# Patient Record
Sex: Male | Born: 1940
Health system: Southern US, Community
[De-identification: ages and names within clinical notes are randomized; demographics above are authoritative.]

## PROBLEM LIST (undated history)

## (undated) ENCOUNTER — Emergency Department (HOSPITAL_BASED_OUTPATIENT_CLINIC_OR_DEPARTMENT_OTHER): Admission: EM | Payer: No Typology Code available for payment source | Source: Home / Self Care

## (undated) DIAGNOSIS — S83209A Unspecified tear of unspecified meniscus, current injury, unspecified knee, initial encounter: Secondary | ICD-10-CM

## (undated) DIAGNOSIS — R600 Localized edema: Secondary | ICD-10-CM

## (undated) DIAGNOSIS — Z8546 Personal history of malignant neoplasm of prostate: Secondary | ICD-10-CM

## (undated) DIAGNOSIS — K644 Residual hemorrhoidal skin tags: Secondary | ICD-10-CM

## (undated) DIAGNOSIS — H903 Sensorineural hearing loss, bilateral: Secondary | ICD-10-CM

## (undated) DIAGNOSIS — K5730 Diverticulosis of large intestine without perforation or abscess without bleeding: Secondary | ICD-10-CM

## (undated) DIAGNOSIS — C61 Malignant neoplasm of prostate: Secondary | ICD-10-CM

## (undated) DIAGNOSIS — I2510 Atherosclerotic heart disease of native coronary artery without angina pectoris: Secondary | ICD-10-CM

## (undated) DIAGNOSIS — K449 Diaphragmatic hernia without obstruction or gangrene: Secondary | ICD-10-CM

## (undated) DIAGNOSIS — E785 Hyperlipidemia, unspecified: Secondary | ICD-10-CM

## (undated) DIAGNOSIS — M1990 Unspecified osteoarthritis, unspecified site: Secondary | ICD-10-CM

## (undated) DIAGNOSIS — Z86010 Personal history of colonic polyps: Secondary | ICD-10-CM

## (undated) DIAGNOSIS — H04123 Dry eye syndrome of bilateral lacrimal glands: Secondary | ICD-10-CM

## (undated) DIAGNOSIS — C189 Malignant neoplasm of colon, unspecified: Secondary | ICD-10-CM

## (undated) DIAGNOSIS — F329 Major depressive disorder, single episode, unspecified: Secondary | ICD-10-CM

## (undated) DIAGNOSIS — B54 Unspecified malaria: Secondary | ICD-10-CM

## (undated) DIAGNOSIS — E538 Deficiency of other specified B group vitamins: Secondary | ICD-10-CM

## (undated) DIAGNOSIS — R350 Frequency of micturition: Secondary | ICD-10-CM

## (undated) DIAGNOSIS — M7730 Calcaneal spur, unspecified foot: Secondary | ICD-10-CM

## (undated) DIAGNOSIS — R931 Abnormal findings on diagnostic imaging of heart and coronary circulation: Secondary | ICD-10-CM

## (undated) DIAGNOSIS — I10 Essential (primary) hypertension: Secondary | ICD-10-CM

## (undated) DIAGNOSIS — M4306 Spondylolysis, lumbar region: Secondary | ICD-10-CM

## (undated) DIAGNOSIS — R252 Cramp and spasm: Secondary | ICD-10-CM

## (undated) DIAGNOSIS — Z950 Presence of cardiac pacemaker: Secondary | ICD-10-CM

## (undated) DIAGNOSIS — K439 Ventral hernia without obstruction or gangrene: Secondary | ICD-10-CM

## (undated) DIAGNOSIS — M109 Gout, unspecified: Secondary | ICD-10-CM

## (undated) DIAGNOSIS — Z85038 Personal history of other malignant neoplasm of large intestine: Secondary | ICD-10-CM

## (undated) DIAGNOSIS — IMO0001 Reserved for inherently not codable concepts without codable children: Secondary | ICD-10-CM

## (undated) DIAGNOSIS — G4733 Obstructive sleep apnea (adult) (pediatric): Secondary | ICD-10-CM

## (undated) DIAGNOSIS — R972 Elevated prostate specific antigen [PSA]: Secondary | ICD-10-CM

## (undated) DIAGNOSIS — J189 Pneumonia, unspecified organism: Secondary | ICD-10-CM

## (undated) DIAGNOSIS — F32A Depression, unspecified: Secondary | ICD-10-CM

## (undated) DIAGNOSIS — K219 Gastro-esophageal reflux disease without esophagitis: Secondary | ICD-10-CM

## (undated) DIAGNOSIS — K911 Postgastric surgery syndromes: Secondary | ICD-10-CM

## (undated) DIAGNOSIS — E559 Vitamin D deficiency, unspecified: Secondary | ICD-10-CM

## (undated) HISTORY — DX: Essential (primary) hypertension: I10

## (undated) HISTORY — PX: CATARACT EXTRACTION W/ INTRAOCULAR LENS  IMPLANT, BILATERAL: SHX1307

## (undated) HISTORY — DX: Personal history of other malignant neoplasm of large intestine: Z85.038

## (undated) HISTORY — DX: Spondylolysis, lumbar region: M43.06

## (undated) HISTORY — PX: KNEE SURGERY: SHX244

## (undated) HISTORY — DX: Dry eye syndrome of bilateral lacrimal glands: H04.123

## (undated) HISTORY — PX: COLONOSCOPY: SHX174

## (undated) HISTORY — DX: Diaphragmatic hernia without obstruction or gangrene: K44.9

## (undated) HISTORY — DX: Personal history of malignant neoplasm of prostate: Z85.46

## (undated) HISTORY — DX: Hyperlipidemia, unspecified: E78.5

## (undated) HISTORY — DX: Elevated prostate specific antigen (PSA): R97.20

## (undated) HISTORY — DX: Sensorineural hearing loss, bilateral: H90.3

## (undated) HISTORY — DX: Obstructive sleep apnea (adult) (pediatric): G47.33

## (undated) HISTORY — DX: Calcaneal spur, unspecified foot: M77.30

## (undated) HISTORY — PX: PROSTATE BIOPSY: SHX241

## (undated) HISTORY — DX: Gastro-esophageal reflux disease without esophagitis: K21.9

## (undated) HISTORY — DX: Malignant neoplasm of prostate: C61

## (undated) HISTORY — DX: Gout, unspecified: M10.9

## (undated) HISTORY — DX: Diverticulosis of large intestine without perforation or abscess without bleeding: K57.30

## (undated) HISTORY — PX: ESOPHAGOGASTRODUODENOSCOPY: SHX1529

## (undated) HISTORY — PX: CARDIAC CATHETERIZATION: SHX172

## (undated) HISTORY — DX: Ventral hernia without obstruction or gangrene: K43.9

## (undated) HISTORY — DX: Residual hemorrhoidal skin tags: K64.4

## (undated) HISTORY — DX: Personal history of colonic polyps: Z86.010

## (undated) HISTORY — DX: Vitamin D deficiency, unspecified: E55.9

## (undated) HISTORY — DX: Deficiency of other specified B group vitamins: E53.8

## (undated) HISTORY — DX: Postgastric surgery syndromes: K91.1

## (undated) HISTORY — DX: Malignant neoplasm of colon, unspecified: C18.9

---
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
## 1962-07-05 DIAGNOSIS — B54 Unspecified malaria: Secondary | ICD-10-CM

## 1962-07-05 HISTORY — DX: Unspecified malaria: B54

## 1981-07-05 DIAGNOSIS — C189 Malignant neoplasm of colon, unspecified: Secondary | ICD-10-CM

## 1981-07-05 HISTORY — DX: Malignant neoplasm of colon, unspecified: C18.9

## 1984-07-05 HISTORY — PX: VERTICAL BANDED GASTROPLASTY: SHX1102

## 1986-07-05 DIAGNOSIS — Z85038 Personal history of other malignant neoplasm of large intestine: Secondary | ICD-10-CM

## 1986-07-05 HISTORY — DX: Personal history of other malignant neoplasm of large intestine: Z85.038

## 1987-07-06 HISTORY — PX: OTHER SURGICAL HISTORY: SHX169

## 2004-07-05 HISTORY — PX: LAPAROSCOPIC INCISIONAL / UMBILICAL / VENTRAL HERNIA REPAIR: SUR789

## 2005-03-24 ENCOUNTER — Ambulatory Visit: Payer: Self-pay | Admitting: Internal Medicine

## 2005-03-25 ENCOUNTER — Ambulatory Visit: Payer: Self-pay | Admitting: Internal Medicine

## 2005-04-07 ENCOUNTER — Ambulatory Visit: Payer: Self-pay | Admitting: Pulmonary Disease

## 2005-05-21 ENCOUNTER — Ambulatory Visit: Payer: Self-pay | Admitting: Internal Medicine

## 2005-06-08 ENCOUNTER — Ambulatory Visit: Payer: Self-pay | Admitting: Gastroenterology

## 2005-06-08 ENCOUNTER — Ambulatory Visit: Payer: Self-pay | Admitting: Internal Medicine

## 2005-06-11 ENCOUNTER — Ambulatory Visit: Payer: Self-pay | Admitting: Internal Medicine

## 2005-06-25 ENCOUNTER — Ambulatory Visit: Payer: Self-pay | Admitting: Gastroenterology

## 2005-07-05 HISTORY — PX: KNEE ARTHROSCOPY: SUR90

## 2005-07-07 ENCOUNTER — Ambulatory Visit: Payer: Self-pay | Admitting: Gastroenterology

## 2005-07-07 ENCOUNTER — Encounter (INDEPENDENT_AMBULATORY_CARE_PROVIDER_SITE_OTHER): Payer: Self-pay | Admitting: Specialist

## 2005-07-07 DIAGNOSIS — K5730 Diverticulosis of large intestine without perforation or abscess without bleeding: Secondary | ICD-10-CM

## 2005-07-07 HISTORY — DX: Diverticulosis of large intestine without perforation or abscess without bleeding: K57.30

## 2005-07-08 ENCOUNTER — Ambulatory Visit: Payer: Self-pay | Admitting: Endocrinology

## 2005-07-19 ENCOUNTER — Ambulatory Visit: Payer: Self-pay | Admitting: Internal Medicine

## 2005-07-21 ENCOUNTER — Ambulatory Visit: Payer: Self-pay | Admitting: Internal Medicine

## 2005-07-26 ENCOUNTER — Ambulatory Visit: Payer: Self-pay | Admitting: Internal Medicine

## 2005-08-01 ENCOUNTER — Encounter: Admission: RE | Admit: 2005-08-01 | Discharge: 2005-08-01 | Payer: Self-pay | Admitting: Internal Medicine

## 2005-08-03 ENCOUNTER — Ambulatory Visit: Payer: Self-pay | Admitting: Internal Medicine

## 2005-09-14 ENCOUNTER — Ambulatory Visit (HOSPITAL_COMMUNITY): Admission: RE | Admit: 2005-09-14 | Discharge: 2005-09-14 | Payer: Self-pay | Admitting: Ophthalmology

## 2005-09-17 ENCOUNTER — Ambulatory Visit: Payer: Self-pay | Admitting: Internal Medicine

## 2005-10-07 ENCOUNTER — Ambulatory Visit: Payer: Self-pay | Admitting: Internal Medicine

## 2005-10-13 ENCOUNTER — Ambulatory Visit: Payer: Self-pay | Admitting: *Deleted

## 2005-11-22 ENCOUNTER — Ambulatory Visit: Payer: Self-pay | Admitting: Internal Medicine

## 2006-01-17 ENCOUNTER — Ambulatory Visit: Payer: Self-pay | Admitting: Internal Medicine

## 2006-07-06 ENCOUNTER — Ambulatory Visit: Payer: Self-pay | Admitting: Internal Medicine

## 2006-07-26 ENCOUNTER — Ambulatory Visit: Payer: Self-pay | Admitting: Internal Medicine

## 2006-08-16 ENCOUNTER — Ambulatory Visit: Payer: Self-pay | Admitting: Internal Medicine

## 2006-08-16 LAB — CONVERTED CEMR LAB
BUN: 16 mg/dL (ref 6–23)
CO2: 30 meq/L (ref 19–32)
Calcium: 9.3 mg/dL (ref 8.4–10.5)
Chloride: 105 meq/L (ref 96–112)
Creatinine, Ser: 1.2 mg/dL (ref 0.4–1.5)
GFR calc Af Amer: 78 mL/min
GFR calc non Af Amer: 64 mL/min
Glucose, Bld: 97 mg/dL (ref 70–99)
Potassium: 4.2 meq/L (ref 3.5–5.1)
Sodium: 142 meq/L (ref 135–145)

## 2006-08-30 ENCOUNTER — Ambulatory Visit: Payer: Self-pay | Admitting: Internal Medicine

## 2006-09-01 ENCOUNTER — Encounter: Admission: RE | Admit: 2006-09-01 | Discharge: 2006-09-01 | Payer: Self-pay | Admitting: Internal Medicine

## 2006-09-03 HISTORY — PX: LAMINECTOMY AND MICRODISCECTOMY LUMBAR SPINE: SHX1913

## 2006-09-05 ENCOUNTER — Ambulatory Visit: Payer: Self-pay | Admitting: Internal Medicine

## 2006-09-19 ENCOUNTER — Inpatient Hospital Stay (HOSPITAL_COMMUNITY): Admission: RE | Admit: 2006-09-19 | Discharge: 2006-09-20 | Payer: Self-pay | Admitting: Neurosurgery

## 2006-10-02 ENCOUNTER — Encounter: Admission: RE | Admit: 2006-10-02 | Discharge: 2006-10-02 | Payer: Self-pay | Admitting: Neurosurgery

## 2006-10-16 ENCOUNTER — Emergency Department (HOSPITAL_COMMUNITY): Admission: EM | Admit: 2006-10-16 | Discharge: 2006-10-17 | Payer: Self-pay | Admitting: Emergency Medicine

## 2006-10-19 ENCOUNTER — Ambulatory Visit: Payer: Self-pay | Admitting: Internal Medicine

## 2006-10-25 ENCOUNTER — Ambulatory Visit: Payer: Self-pay | Admitting: Gastroenterology

## 2006-10-26 ENCOUNTER — Ambulatory Visit: Payer: Self-pay | Admitting: Gastroenterology

## 2006-10-26 DIAGNOSIS — K449 Diaphragmatic hernia without obstruction or gangrene: Secondary | ICD-10-CM | POA: Insufficient documentation

## 2006-11-10 ENCOUNTER — Encounter: Admission: RE | Admit: 2006-11-10 | Discharge: 2006-11-10 | Payer: Self-pay | Admitting: Surgery

## 2006-11-11 ENCOUNTER — Encounter: Admission: RE | Admit: 2006-11-11 | Discharge: 2006-11-11 | Payer: Self-pay | Admitting: Surgery

## 2006-11-24 ENCOUNTER — Encounter
Admission: RE | Admit: 2006-11-24 | Discharge: 2006-11-24 | Payer: Self-pay | Admitting: Physical Medicine and Rehabilitation

## 2007-01-18 DIAGNOSIS — M109 Gout, unspecified: Secondary | ICD-10-CM

## 2007-01-18 DIAGNOSIS — G4733 Obstructive sleep apnea (adult) (pediatric): Secondary | ICD-10-CM

## 2007-01-18 DIAGNOSIS — Z85038 Personal history of other malignant neoplasm of large intestine: Secondary | ICD-10-CM | POA: Insufficient documentation

## 2007-01-18 DIAGNOSIS — K219 Gastro-esophageal reflux disease without esophagitis: Secondary | ICD-10-CM | POA: Insufficient documentation

## 2007-01-18 DIAGNOSIS — Z860100 Personal history of colon polyps, unspecified: Secondary | ICD-10-CM | POA: Insufficient documentation

## 2007-01-18 DIAGNOSIS — I10 Essential (primary) hypertension: Secondary | ICD-10-CM | POA: Insufficient documentation

## 2007-01-18 HISTORY — DX: Essential (primary) hypertension: I10

## 2007-01-18 HISTORY — DX: Gout, unspecified: M10.9

## 2007-01-18 HISTORY — DX: Personal history of colonic polyps: Z86.010

## 2007-01-18 HISTORY — DX: Obstructive sleep apnea (adult) (pediatric): G47.33

## 2007-01-18 HISTORY — DX: Gastro-esophageal reflux disease without esophagitis: K21.9

## 2007-01-18 HISTORY — DX: Personal history of colon polyps, unspecified: Z86.0100

## 2007-01-31 ENCOUNTER — Ambulatory Visit: Payer: Self-pay | Admitting: Gastroenterology

## 2007-02-06 ENCOUNTER — Ambulatory Visit (HOSPITAL_COMMUNITY): Admission: RE | Admit: 2007-02-06 | Discharge: 2007-02-06 | Payer: Self-pay | Admitting: Gastroenterology

## 2007-02-17 ENCOUNTER — Ambulatory Visit: Payer: Self-pay | Admitting: Internal Medicine

## 2007-02-20 ENCOUNTER — Ambulatory Visit: Payer: Self-pay | Admitting: Internal Medicine

## 2007-02-20 LAB — CONVERTED CEMR LAB
ALT: 18 units/L (ref 0–53)
AST: 20 units/L (ref 0–37)
BUN: 28 mg/dL — ABNORMAL HIGH (ref 6–23)
Basophils Absolute: 0 10*3/uL (ref 0.0–0.1)
Basophils Relative: 0.6 % (ref 0.0–1.0)
CO2: 28 meq/L (ref 19–32)
Calcium: 9.2 mg/dL (ref 8.4–10.5)
Chloride: 110 meq/L (ref 96–112)
Cholesterol: 178 mg/dL (ref 0–200)
Creatinine, Ser: 1.2 mg/dL (ref 0.4–1.5)
Eosinophils Absolute: 0.2 10*3/uL (ref 0.0–0.6)
Eosinophils Relative: 3.2 % (ref 0.0–5.0)
GFR calc Af Amer: 78 mL/min
GFR calc non Af Amer: 64 mL/min
Glucose, Bld: 113 mg/dL — ABNORMAL HIGH (ref 70–99)
HCT: 38.4 % — ABNORMAL LOW (ref 39.0–52.0)
HDL: 34.3 mg/dL — ABNORMAL LOW (ref >39.0)
Hemoglobin: 13.4 g/dL (ref 13.0–17.0)
Hgb A1c MFr Bld: 5.6 % (ref 4.6–6.0)
LDL Cholesterol: 118 mg/dL — ABNORMAL HIGH (ref 0–99)
Lymphocytes Relative: 34.8 % (ref 12.0–46.0)
MCHC: 34.9 g/dL (ref 30.0–36.0)
MCV: 87 fL (ref 78.0–100.0)
Monocytes Absolute: 0.5 10*3/uL (ref 0.2–0.7)
Monocytes Relative: 9.4 % (ref 3.0–11.0)
Neutro Abs: 2.6 10*3/uL (ref 1.4–7.7)
Neutrophils Relative %: 52 % (ref 43.0–77.0)
PSA: 3.42 ng/mL (ref 0.10–4.00)
Platelets: 220 10*3/uL (ref 150–400)
Potassium: 4.2 meq/L (ref 3.5–5.1)
RBC: 4.42 M/uL (ref 4.22–5.81)
RDW: 13.5 % (ref 11.5–14.6)
Sodium: 145 meq/L (ref 135–145)
TSH: 0.38 microintl units/mL (ref 0.35–5.50)
Total CHOL/HDL Ratio: 5.2
Triglycerides: 130 mg/dL (ref 0–149)
VLDL: 26 mg/dL (ref 0–40)
WBC: 5 10*3/uL (ref 4.5–10.5)

## 2007-02-27 ENCOUNTER — Ambulatory Visit: Payer: Self-pay | Admitting: Gastroenterology

## 2007-02-27 ENCOUNTER — Encounter: Payer: Self-pay | Admitting: Gastroenterology

## 2007-03-01 ENCOUNTER — Ambulatory Visit (HOSPITAL_COMMUNITY): Admission: RE | Admit: 2007-03-01 | Discharge: 2007-03-01 | Payer: Self-pay | Admitting: Gastroenterology

## 2007-04-03 ENCOUNTER — Ambulatory Visit (HOSPITAL_COMMUNITY): Admission: RE | Admit: 2007-04-03 | Discharge: 2007-04-03 | Payer: Self-pay | Admitting: Gastroenterology

## 2007-04-18 ENCOUNTER — Encounter: Admission: RE | Admit: 2007-04-18 | Discharge: 2007-04-18 | Payer: Self-pay | Admitting: Specialist

## 2007-05-09 ENCOUNTER — Encounter: Payer: Self-pay | Admitting: Internal Medicine

## 2007-05-23 ENCOUNTER — Ambulatory Visit (HOSPITAL_BASED_OUTPATIENT_CLINIC_OR_DEPARTMENT_OTHER): Admission: RE | Admit: 2007-05-23 | Discharge: 2007-05-23 | Payer: Self-pay | Admitting: Specialist

## 2007-05-23 HISTORY — PX: SHOULDER ARTHROSCOPY: SHX128

## 2007-06-05 ENCOUNTER — Ambulatory Visit: Payer: Self-pay | Admitting: Internal Medicine

## 2007-06-13 ENCOUNTER — Encounter: Payer: Self-pay | Admitting: Internal Medicine

## 2007-06-22 ENCOUNTER — Ambulatory Visit: Payer: Self-pay | Admitting: Gastroenterology

## 2007-08-17 ENCOUNTER — Telehealth: Payer: Self-pay | Admitting: Internal Medicine

## 2007-08-21 ENCOUNTER — Ambulatory Visit: Payer: Self-pay | Admitting: Internal Medicine

## 2007-08-21 DIAGNOSIS — G4700 Insomnia, unspecified: Secondary | ICD-10-CM

## 2007-08-21 HISTORY — DX: Insomnia, unspecified: G47.00

## 2007-10-17 DIAGNOSIS — E669 Obesity, unspecified: Secondary | ICD-10-CM | POA: Insufficient documentation

## 2007-10-23 ENCOUNTER — Ambulatory Visit: Payer: Self-pay | Admitting: Internal Medicine

## 2007-10-23 LAB — CONVERTED CEMR LAB
ALT: 16 units/L (ref 0–53)
AST: 16 units/L (ref 0–37)
BUN: 16 mg/dL (ref 6–23)
CO2: 30 meq/L (ref 19–32)
Calcium: 9.1 mg/dL (ref 8.4–10.5)
Chloride: 111 meq/L (ref 96–112)
Cholesterol: 195 mg/dL (ref 0–200)
Creatinine, Ser: 1.2 mg/dL (ref 0.4–1.5)
GFR calc Af Amer: 78 mL/min
GFR calc non Af Amer: 64 mL/min
Glucose, Bld: 110 mg/dL — ABNORMAL HIGH (ref 70–99)
HDL: 34.1 mg/dL — ABNORMAL LOW (ref >39.0)
Hgb A1c MFr Bld: 5.9 % (ref 4.6–6.0)
LDL Cholesterol: 142 mg/dL — ABNORMAL HIGH (ref 0–99)
Potassium: 4.1 meq/L (ref 3.5–5.1)
Sodium: 144 meq/L (ref 135–145)
TSH: 0.55 microintl units/mL (ref 0.35–5.50)
Total CHOL/HDL Ratio: 5.7
Triglycerides: 96 mg/dL (ref 0–149)
Uric Acid, Serum: 7.2 mg/dL (ref 4.0–7.8)
VLDL: 19 mg/dL (ref 0–40)

## 2007-11-13 ENCOUNTER — Ambulatory Visit: Payer: Self-pay | Admitting: Internal Medicine

## 2008-01-29 ENCOUNTER — Encounter: Payer: Self-pay | Admitting: Internal Medicine

## 2008-02-12 ENCOUNTER — Ambulatory Visit: Payer: Self-pay | Admitting: Internal Medicine

## 2008-02-22 ENCOUNTER — Ambulatory Visit: Payer: Self-pay | Admitting: Internal Medicine

## 2008-02-22 LAB — CONVERTED CEMR LAB
BUN: 20 mg/dL (ref 6–23)
CO2: 28 meq/L (ref 19–32)
Calcium: 8.8 mg/dL (ref 8.4–10.5)
Chloride: 110 meq/L (ref 96–112)
Cholesterol: 138 mg/dL (ref 0–200)
Creatinine, Ser: 1.1 mg/dL (ref 0.4–1.5)
GFR calc Af Amer: 86 mL/min
GFR calc non Af Amer: 71 mL/min
Glucose, Bld: 97 mg/dL (ref 70–99)
HDL: 26.9 mg/dL — ABNORMAL LOW (ref >39.0)
LDL Cholesterol: 93 mg/dL (ref 0–99)
Potassium: 4.1 meq/L (ref 3.5–5.1)
Sodium: 143 meq/L (ref 135–145)
TSH: 0.57 microintl units/mL (ref 0.35–5.50)
Total CHOL/HDL Ratio: 5.1
Triglycerides: 89 mg/dL (ref 0–149)
Uric Acid, Serum: 5.8 mg/dL (ref 4.0–7.8)
VLDL: 18 mg/dL (ref 0–40)

## 2008-03-14 ENCOUNTER — Telehealth: Payer: Self-pay | Admitting: Internal Medicine

## 2008-04-30 ENCOUNTER — Ambulatory Visit: Payer: Self-pay | Admitting: Internal Medicine

## 2008-05-02 ENCOUNTER — Encounter: Payer: Self-pay | Admitting: Internal Medicine

## 2008-07-23 ENCOUNTER — Ambulatory Visit: Payer: Self-pay | Admitting: Internal Medicine

## 2008-07-23 LAB — CONVERTED CEMR LAB
BUN: 18 mg/dL (ref 6–23)
CO2: 30 meq/L (ref 19–32)
Calcium: 9.1 mg/dL (ref 8.4–10.5)
Chloride: 107 meq/L (ref 96–112)
Creatinine, Ser: 1.2 mg/dL (ref 0.4–1.5)
GFR calc Af Amer: 77 mL/min
GFR calc non Af Amer: 64 mL/min
Glucose, Bld: 110 mg/dL — ABNORMAL HIGH (ref 70–99)
Hgb A1c MFr Bld: 5.8 % (ref 4.6–6.0)
Potassium: 4.5 meq/L (ref 3.5–5.1)
Sodium: 144 meq/L (ref 135–145)

## 2008-07-26 ENCOUNTER — Ambulatory Visit: Payer: Self-pay | Admitting: Internal Medicine

## 2008-07-26 ENCOUNTER — Ambulatory Visit: Payer: Self-pay | Admitting: Gastroenterology

## 2008-07-26 DIAGNOSIS — E785 Hyperlipidemia, unspecified: Secondary | ICD-10-CM | POA: Insufficient documentation

## 2008-08-19 ENCOUNTER — Encounter: Payer: Self-pay | Admitting: Gastroenterology

## 2008-08-19 ENCOUNTER — Ambulatory Visit: Payer: Self-pay | Admitting: Gastroenterology

## 2008-08-21 ENCOUNTER — Encounter: Payer: Self-pay | Admitting: Gastroenterology

## 2008-08-28 ENCOUNTER — Telehealth (INDEPENDENT_AMBULATORY_CARE_PROVIDER_SITE_OTHER): Payer: Self-pay | Admitting: *Deleted

## 2008-09-16 ENCOUNTER — Ambulatory Visit: Payer: Self-pay | Admitting: Internal Medicine

## 2008-09-16 DIAGNOSIS — M7660 Achilles tendinitis, unspecified leg: Secondary | ICD-10-CM | POA: Insufficient documentation

## 2008-09-23 ENCOUNTER — Encounter: Payer: Self-pay | Admitting: Internal Medicine

## 2008-10-08 ENCOUNTER — Ambulatory Visit: Payer: Self-pay | Admitting: Internal Medicine

## 2008-10-08 DIAGNOSIS — J069 Acute upper respiratory infection, unspecified: Secondary | ICD-10-CM | POA: Insufficient documentation

## 2008-10-16 ENCOUNTER — Ambulatory Visit (HOSPITAL_BASED_OUTPATIENT_CLINIC_OR_DEPARTMENT_OTHER): Admission: RE | Admit: 2008-10-16 | Discharge: 2008-10-16 | Payer: Self-pay | Admitting: Internal Medicine

## 2008-10-16 ENCOUNTER — Ambulatory Visit: Payer: Self-pay | Admitting: Diagnostic Radiology

## 2008-10-16 ENCOUNTER — Ambulatory Visit: Payer: Self-pay | Admitting: Internal Medicine

## 2008-10-16 DIAGNOSIS — R05 Cough: Secondary | ICD-10-CM | POA: Insufficient documentation

## 2008-10-16 DIAGNOSIS — R059 Cough, unspecified: Secondary | ICD-10-CM | POA: Insufficient documentation

## 2008-10-24 ENCOUNTER — Ambulatory Visit: Payer: Self-pay | Admitting: Internal Medicine

## 2008-11-26 ENCOUNTER — Ambulatory Visit: Payer: Self-pay | Admitting: Internal Medicine

## 2008-11-26 LAB — CONVERTED CEMR LAB
BUN: 17 mg/dL (ref 6–23)
CO2: 27 meq/L (ref 19–32)
Calcium: 8.9 mg/dL (ref 8.4–10.5)
Chloride: 112 meq/L (ref 96–112)
Creatinine, Ser: 1.2 mg/dL (ref 0.4–1.5)
GFR calc non Af Amer: 63.82 mL/min (ref >60)
Glucose, Bld: 100 mg/dL — ABNORMAL HIGH (ref 70–99)
Potassium: 4.3 meq/L (ref 3.5–5.1)
Sodium: 145 meq/L (ref 135–145)

## 2008-11-29 ENCOUNTER — Telehealth: Payer: Self-pay | Admitting: Internal Medicine

## 2009-02-04 ENCOUNTER — Ambulatory Visit: Payer: Self-pay | Admitting: Internal Medicine

## 2009-02-04 DIAGNOSIS — K439 Ventral hernia without obstruction or gangrene: Secondary | ICD-10-CM | POA: Insufficient documentation

## 2009-03-31 ENCOUNTER — Ambulatory Visit: Payer: Self-pay | Admitting: Internal Medicine

## 2009-03-31 DIAGNOSIS — M7730 Calcaneal spur, unspecified foot: Secondary | ICD-10-CM | POA: Insufficient documentation

## 2009-04-01 ENCOUNTER — Ambulatory Visit: Payer: Self-pay | Admitting: Internal Medicine

## 2009-04-01 LAB — CONVERTED CEMR LAB
ALT: 15 units/L (ref 0–53)
AST: 13 units/L (ref 0–37)
Albumin: 3.9 g/dL (ref 3.5–5.2)
Alkaline Phosphatase: 62 units/L (ref 39–117)
BUN: 18 mg/dL (ref 6–23)
Bilirubin, Direct: 0.1 mg/dL (ref 0.0–0.3)
CO2: 25 meq/L (ref 19–32)
Calcium: 8.8 mg/dL (ref 8.4–10.5)
Chloride: 106 meq/L (ref 96–112)
Cholesterol: 128 mg/dL (ref 0–200)
Creatinine, Ser: 1.1 mg/dL (ref 0.40–1.50)
Glucose, Bld: 95 mg/dL (ref 70–99)
HDL: 44 mg/dL (ref >39)
Hgb A1c MFr Bld: 5.5 % (ref 4.6–6.1)
Indirect Bilirubin: 0.3 mg/dL (ref 0.0–0.9)
LDL Cholesterol: 57 mg/dL (ref 0–99)
Potassium: 4.1 meq/L (ref 3.5–5.3)
Sodium: 142 meq/L (ref 135–145)
TSH: 0.762 microintl units/mL (ref 0.350–4.500)
Total Bilirubin: 0.4 mg/dL (ref 0.3–1.2)
Total CHOL/HDL Ratio: 2.9
Total Protein: 6.5 g/dL (ref 6.0–8.3)
Triglycerides: 135 mg/dL (ref <150)
VLDL: 27 mg/dL (ref 0–40)

## 2009-04-11 ENCOUNTER — Encounter: Admission: RE | Admit: 2009-04-11 | Discharge: 2009-07-02 | Payer: Self-pay | Admitting: Orthopedic Surgery

## 2009-06-06 ENCOUNTER — Ambulatory Visit: Payer: Self-pay | Admitting: Internal Medicine

## 2009-06-30 ENCOUNTER — Telehealth: Payer: Self-pay | Admitting: Internal Medicine

## 2009-07-01 ENCOUNTER — Ambulatory Visit: Payer: Self-pay | Admitting: Radiology

## 2009-07-01 ENCOUNTER — Telehealth: Payer: Self-pay | Admitting: Internal Medicine

## 2009-07-01 ENCOUNTER — Observation Stay (HOSPITAL_COMMUNITY): Admission: AD | Admit: 2009-07-01 | Discharge: 2009-07-02 | Payer: Self-pay | Admitting: Internal Medicine

## 2009-07-01 ENCOUNTER — Ambulatory Visit: Payer: Self-pay | Admitting: Cardiovascular Disease

## 2009-07-01 ENCOUNTER — Encounter: Payer: Self-pay | Admitting: Emergency Medicine

## 2009-07-02 ENCOUNTER — Encounter (INDEPENDENT_AMBULATORY_CARE_PROVIDER_SITE_OTHER): Payer: Self-pay | Admitting: Internal Medicine

## 2009-07-02 ENCOUNTER — Telehealth: Payer: Self-pay | Admitting: Internal Medicine

## 2009-07-02 DIAGNOSIS — R079 Chest pain, unspecified: Secondary | ICD-10-CM | POA: Insufficient documentation

## 2009-07-03 ENCOUNTER — Telehealth (INDEPENDENT_AMBULATORY_CARE_PROVIDER_SITE_OTHER): Payer: Self-pay | Admitting: *Deleted

## 2009-07-07 ENCOUNTER — Ambulatory Visit: Payer: Self-pay

## 2009-07-07 ENCOUNTER — Encounter (HOSPITAL_COMMUNITY): Admission: RE | Admit: 2009-07-07 | Discharge: 2009-09-08 | Payer: Self-pay | Admitting: Internal Medicine

## 2009-07-07 ENCOUNTER — Ambulatory Visit: Payer: Self-pay | Admitting: Cardiology

## 2009-07-08 ENCOUNTER — Telehealth (INDEPENDENT_AMBULATORY_CARE_PROVIDER_SITE_OTHER): Payer: Self-pay | Admitting: *Deleted

## 2009-07-11 ENCOUNTER — Encounter (INDEPENDENT_AMBULATORY_CARE_PROVIDER_SITE_OTHER): Payer: Self-pay | Admitting: *Deleted

## 2009-07-30 ENCOUNTER — Encounter: Payer: Self-pay | Admitting: Internal Medicine

## 2009-08-01 ENCOUNTER — Encounter (INDEPENDENT_AMBULATORY_CARE_PROVIDER_SITE_OTHER): Payer: Self-pay | Admitting: *Deleted

## 2009-08-01 ENCOUNTER — Telehealth: Payer: Self-pay | Admitting: Gastroenterology

## 2009-08-04 ENCOUNTER — Encounter (INDEPENDENT_AMBULATORY_CARE_PROVIDER_SITE_OTHER): Payer: Self-pay | Admitting: *Deleted

## 2009-08-04 ENCOUNTER — Ambulatory Visit: Payer: Self-pay | Admitting: Gastroenterology

## 2009-08-20 ENCOUNTER — Ambulatory Visit: Payer: Self-pay | Admitting: Gastroenterology

## 2009-08-20 LAB — HM COLONOSCOPY

## 2009-08-21 DIAGNOSIS — Z86010 Personal history of colonic polyps: Secondary | ICD-10-CM

## 2009-08-21 HISTORY — DX: Personal history of colonic polyps: Z86.010

## 2009-08-25 ENCOUNTER — Encounter: Payer: Self-pay | Admitting: Gastroenterology

## 2009-09-11 ENCOUNTER — Encounter: Payer: Self-pay | Admitting: Internal Medicine

## 2009-09-11 LAB — HM DIABETES EYE EXAM: HM Diabetic Eye Exam: NORMAL

## 2009-09-16 ENCOUNTER — Encounter: Payer: Self-pay | Admitting: Internal Medicine

## 2009-09-23 ENCOUNTER — Emergency Department (HOSPITAL_COMMUNITY): Admission: EM | Admit: 2009-09-23 | Discharge: 2009-09-24 | Payer: Self-pay | Admitting: Emergency Medicine

## 2009-09-25 ENCOUNTER — Ambulatory Visit: Payer: Self-pay | Admitting: Internal Medicine

## 2009-10-24 ENCOUNTER — Encounter: Admission: RE | Admit: 2009-10-24 | Discharge: 2009-10-24 | Payer: Self-pay | Admitting: Specialist

## 2009-10-27 ENCOUNTER — Ambulatory Visit: Payer: Self-pay | Admitting: Family

## 2009-10-27 DIAGNOSIS — R252 Cramp and spasm: Secondary | ICD-10-CM | POA: Insufficient documentation

## 2009-10-27 LAB — CONVERTED CEMR LAB
BUN: 20 mg/dL (ref 6–23)
CO2: 26 meq/L (ref 19–32)
Calcium: 9.2 mg/dL (ref 8.4–10.5)
Chloride: 109 meq/L (ref 96–112)
Creatinine, Ser: 1.15 mg/dL (ref 0.40–1.50)
Glucose, Bld: 94 mg/dL (ref 70–99)
Potassium: 4.4 meq/L (ref 3.5–5.3)
Sodium: 145 meq/L (ref 135–145)
Total CK: 152 units/L (ref 7–232)
Vit D, 1,25-Dihydroxy: 15 — ABNORMAL LOW (ref 30–89)

## 2009-10-28 ENCOUNTER — Telehealth: Payer: Self-pay | Admitting: Internal Medicine

## 2009-10-28 DIAGNOSIS — E559 Vitamin D deficiency, unspecified: Secondary | ICD-10-CM

## 2009-10-28 HISTORY — DX: Vitamin D deficiency, unspecified: E55.9

## 2010-01-06 ENCOUNTER — Encounter: Payer: Self-pay | Admitting: Internal Medicine

## 2010-01-07 ENCOUNTER — Encounter: Payer: Self-pay | Admitting: Internal Medicine

## 2010-01-07 LAB — CONVERTED CEMR LAB: Vit D, 1,25-Dihydroxy: 31 (ref 30–89)

## 2010-01-19 ENCOUNTER — Ambulatory Visit: Payer: Self-pay | Admitting: Internal Medicine

## 2010-01-19 DIAGNOSIS — R7303 Prediabetes: Secondary | ICD-10-CM | POA: Insufficient documentation

## 2010-01-19 LAB — CONVERTED CEMR LAB
ALT: 21 units/L (ref 0–53)
AST: 19 units/L (ref 0–37)
Albumin: 4.2 g/dL (ref 3.5–5.2)
Alkaline Phosphatase: 56 units/L (ref 39–117)
BUN: 15 mg/dL (ref 6–23)
Bilirubin, Direct: 0.1 mg/dL (ref 0.0–0.3)
CO2: 20 meq/L (ref 19–32)
Calcium: 9.1 mg/dL (ref 8.4–10.5)
Chloride: 107 meq/L (ref 96–112)
Cholesterol: 137 mg/dL (ref 0–200)
Creatinine, Ser: 0.86 mg/dL (ref 0.40–1.50)
Glucose, Bld: 89 mg/dL (ref 70–99)
HDL: 47 mg/dL (ref >39)
Hgb A1c MFr Bld: 5.8 % — ABNORMAL HIGH (ref <5.7)
Indirect Bilirubin: 0.4 mg/dL (ref 0.0–0.9)
LDL Cholesterol: 70 mg/dL (ref 0–99)
Potassium: 4.3 meq/L (ref 3.5–5.3)
Sodium: 142 meq/L (ref 135–145)
Total Bilirubin: 0.5 mg/dL (ref 0.3–1.2)
Total CHOL/HDL Ratio: 2.9
Total Protein: 7 g/dL (ref 6.0–8.3)
Triglycerides: 102 mg/dL (ref <150)
VLDL: 20 mg/dL (ref 0–40)

## 2010-01-20 ENCOUNTER — Encounter: Payer: Self-pay | Admitting: Internal Medicine

## 2010-02-27 ENCOUNTER — Ambulatory Visit: Payer: Self-pay | Admitting: Internal Medicine

## 2010-02-27 DIAGNOSIS — N3944 Nocturnal enuresis: Secondary | ICD-10-CM | POA: Insufficient documentation

## 2010-03-01 ENCOUNTER — Inpatient Hospital Stay (HOSPITAL_COMMUNITY): Admission: EM | Admit: 2010-03-01 | Discharge: 2010-03-03 | Payer: Self-pay | Admitting: Emergency Medicine

## 2010-03-01 ENCOUNTER — Encounter (INDEPENDENT_AMBULATORY_CARE_PROVIDER_SITE_OTHER): Payer: Self-pay | Admitting: *Deleted

## 2010-03-02 ENCOUNTER — Encounter (INDEPENDENT_AMBULATORY_CARE_PROVIDER_SITE_OTHER): Payer: Self-pay | Admitting: *Deleted

## 2010-03-03 ENCOUNTER — Ambulatory Visit: Payer: Self-pay | Admitting: Internal Medicine

## 2010-03-04 ENCOUNTER — Telehealth: Payer: Self-pay | Admitting: Gastroenterology

## 2010-03-11 ENCOUNTER — Ambulatory Visit: Payer: Self-pay | Admitting: Vascular Surgery

## 2010-03-11 ENCOUNTER — Ambulatory Visit (HOSPITAL_COMMUNITY)
Admission: RE | Admit: 2010-03-11 | Discharge: 2010-03-11 | Payer: Self-pay | Admitting: Physical Medicine and Rehabilitation

## 2010-03-11 ENCOUNTER — Encounter (INDEPENDENT_AMBULATORY_CARE_PROVIDER_SITE_OTHER): Payer: Self-pay | Admitting: Physical Medicine and Rehabilitation

## 2010-03-12 ENCOUNTER — Ambulatory Visit: Payer: Self-pay | Admitting: Gastroenterology

## 2010-03-12 DIAGNOSIS — K56609 Unspecified intestinal obstruction, unspecified as to partial versus complete obstruction: Secondary | ICD-10-CM | POA: Insufficient documentation

## 2010-04-20 ENCOUNTER — Ambulatory Visit (HOSPITAL_BASED_OUTPATIENT_CLINIC_OR_DEPARTMENT_OTHER): Admission: RE | Admit: 2010-04-20 | Discharge: 2010-04-20 | Payer: Self-pay | Admitting: Internal Medicine

## 2010-04-20 ENCOUNTER — Encounter: Payer: Self-pay | Admitting: Internal Medicine

## 2010-04-20 ENCOUNTER — Telehealth: Payer: Self-pay | Admitting: Internal Medicine

## 2010-04-20 ENCOUNTER — Ambulatory Visit: Payer: Self-pay | Admitting: Interventional Radiology

## 2010-04-20 ENCOUNTER — Ambulatory Visit: Payer: Self-pay | Admitting: Internal Medicine

## 2010-04-20 DIAGNOSIS — R0602 Shortness of breath: Secondary | ICD-10-CM | POA: Insufficient documentation

## 2010-04-20 LAB — CONVERTED CEMR LAB
BUN: 16 mg/dL (ref 6–23)
CO2: 22 meq/L (ref 19–32)
Calcium: 9 mg/dL (ref 8.4–10.5)
Chloride: 108 meq/L (ref 96–112)
Creatinine, Ser: 1.07 mg/dL (ref 0.40–1.50)
Glucose, Bld: 92 mg/dL (ref 70–99)
HCT: 40.8 % (ref 39.0–52.0)
Hemoglobin: 13.6 g/dL (ref 13.0–17.0)
MCHC: 33.3 g/dL (ref 30.0–36.0)
MCV: 85.5 fL (ref 78.0–100.0)
Platelets: 193 10*3/uL (ref 150–400)
Potassium: 4.1 meq/L (ref 3.5–5.3)
Pro B Natriuretic peptide (BNP): 5.3 pg/mL (ref 0.0–100.0)
RBC: 4.77 M/uL (ref 4.22–5.81)
RDW: 14.9 % (ref 11.5–15.5)
Sodium: 141 meq/L (ref 135–145)
WBC: 6.3 10*3/uL (ref 4.0–10.5)

## 2010-04-21 ENCOUNTER — Encounter: Payer: Self-pay | Admitting: Cardiology

## 2010-04-21 ENCOUNTER — Ambulatory Visit: Payer: Self-pay | Admitting: Cardiology

## 2010-04-21 ENCOUNTER — Telehealth: Payer: Self-pay | Admitting: Internal Medicine

## 2010-04-22 ENCOUNTER — Telehealth (INDEPENDENT_AMBULATORY_CARE_PROVIDER_SITE_OTHER): Payer: Self-pay | Admitting: Radiology

## 2010-04-23 ENCOUNTER — Encounter (HOSPITAL_COMMUNITY)
Admission: RE | Admit: 2010-04-23 | Discharge: 2010-07-04 | Payer: Self-pay | Source: Home / Self Care | Attending: Cardiology | Admitting: Cardiology

## 2010-04-23 ENCOUNTER — Encounter: Payer: Self-pay | Admitting: Internal Medicine

## 2010-04-23 ENCOUNTER — Ambulatory Visit: Payer: Self-pay | Admitting: Internal Medicine

## 2010-04-23 ENCOUNTER — Encounter: Payer: Self-pay | Admitting: Cardiology

## 2010-04-23 ENCOUNTER — Ambulatory Visit: Payer: Self-pay

## 2010-04-23 ENCOUNTER — Ambulatory Visit (HOSPITAL_COMMUNITY): Admission: RE | Admit: 2010-04-23 | Discharge: 2010-04-23 | Payer: Self-pay | Admitting: Cardiology

## 2010-04-23 DIAGNOSIS — IMO0001 Reserved for inherently not codable concepts without codable children: Secondary | ICD-10-CM

## 2010-04-23 DIAGNOSIS — R931 Abnormal findings on diagnostic imaging of heart and coronary circulation: Secondary | ICD-10-CM

## 2010-04-23 HISTORY — DX: Abnormal findings on diagnostic imaging of heart and coronary circulation: R93.1

## 2010-04-23 HISTORY — DX: Reserved for inherently not codable concepts without codable children: IMO0001

## 2010-04-29 ENCOUNTER — Telehealth: Payer: Self-pay | Admitting: Internal Medicine

## 2010-05-07 ENCOUNTER — Encounter: Payer: Self-pay | Admitting: Internal Medicine

## 2010-05-20 ENCOUNTER — Telehealth: Payer: Self-pay | Admitting: Internal Medicine

## 2010-05-27 ENCOUNTER — Ambulatory Visit: Payer: Self-pay | Admitting: Internal Medicine

## 2010-07-14 ENCOUNTER — Ambulatory Visit: Admit: 2010-07-14 | Payer: Self-pay | Admitting: Internal Medicine

## 2010-07-15 ENCOUNTER — Encounter: Payer: Self-pay | Admitting: Internal Medicine

## 2010-07-16 ENCOUNTER — Ambulatory Visit
Admission: RE | Admit: 2010-07-16 | Discharge: 2010-07-16 | Payer: Self-pay | Source: Home / Self Care | Attending: Internal Medicine | Admitting: Internal Medicine

## 2010-07-26 ENCOUNTER — Encounter: Payer: Self-pay | Admitting: Neurosurgery

## 2010-08-04 NOTE — Letter (Signed)
Summary: [facility] Instructions  [facility]  [address] [place], [place] [address]   Phone: [phone]  Fax: [phone]       Marcus [name]    December 09, 1940    MRN: [id number]        Procedure Day Dorna Bloom:  Wednesday 08/20/09     Arrival Time:  8:00am     Procedure Time:  9:00am     Location of Procedure:                    _X _   Endoscopy Center (4th Floor)                        PREPARATION FOR COLONOSCOPY WITH MOVIPREP   Starting 5 days prior to your procedure   Friday 02/11 do not eat nuts, seeds, popcorn, corn, beans, peas,  salads, or any raw vegetables.  Do not take any fiber supplements (e.g. Metamucil, Citrucel, and Benefiber).  THE DAY BEFORE YOUR PROCEDURE         DATE:  02/15   DAY: Tuesday  1.  Drink clear liquids the entire day-NO SOLID FOOD  2.  Do not drink anything colored red or purple.  Avoid juices with pulp.  No orange juice.  3.  Drink at least 64 oz. (8 glasses) of fluid/clear liquids during the day to prevent dehydration and help the prep work efficiently.  CLEAR LIQUIDS INCLUDE: Water Jello Ice Popsicles Tea (sugar ok, no milk/cream) Powdered fruit flavored drinks Coffee (sugar ok, no milk/cream) Gatorade Juice: apple, white grape, white cranberry  Lemonade Clear bullion, consomm, broth Carbonated beverages (any kind) Strained chicken noodle soup Hard Candy                             4.  In the morning, mix first dose of MoviPrep solution:    Empty 1 Pouch A and 1 Pouch B into the disposable container    Add lukewarm drinking water to the top line of the container. Mix to dissolve    Refrigerate (mixed solution should be used within 24 hrs)  5.  Begin drinking the prep at 5:00 p.m. The MoviPrep container is divided by 4 marks.   Every 15 minutes drink the solution down to the next mark (approximately 8 oz) until the full liter is complete.   6.  Follow completed prep with 16 oz of clear liquid of your  choice (Nothing red or purple).  Continue to drink clear liquids until bedtime.  7.  Before going to bed, mix second dose of MoviPrep solution:    Empty 1 Pouch A and 1 Pouch B into the disposable container    Add lukewarm drinking water to the top line of the container. Mix to dissolve    Refrigerate  THE DAY OF YOUR PROCEDURE      DATE:  02/16 DAY: Wednesday  Beginning at  4:00 a.m. (5 hours before procedure):         1. Every 15 minutes, drink the solution down to the next mark (approx 8 oz) until the full liter is complete.  2. Follow completed prep with 16 oz. of clear liquid of your choice.    3. You may drink clear liquids until 7:00am (2 HOURS BEFORE PROCEDURE).   MEDICATION INSTRUCTIONS  Unless otherwise instructed, you should take regular prescription medications with a small sip of water  as early as possible the morning of your procedure.  See separate diabetic instructions.       OTHER INSTRUCTIONS  You will need a responsible adult at least 71 years of age to accompany you and drive you home.   This person must remain in the waiting room during your procedure.  Wear loose fitting clothing that is easily removed.  Leave jewelry and other valuables at home.  However, you may wish to bring a book to read or  an iPod/MP3 player to listen to music as you wait for your procedure to start.  Remove all body piercing jewelry and leave at home.  Total time from sign-in until discharge is approximately 2-3 hours.  You should go home directly after your procedure and rest.  You can resume normal activities the  day after your procedure.  The day of your procedure you should not:   Drive   Make legal decisions   Operate machinery   Drink alcohol   Return to work  You will receive specific instructions about eating, activities and medications before you leave.    The above instructions have been reviewed and explained to me by   Wyona Almas RN   August 04, 2009 2:51 PM     I fully understand and can verbalize these instructions _____________________________ Date _________

## 2010-08-04 NOTE — Assessment & Plan Note (Signed)
Summary: consultion Leafy Half ok per Dr Yoo/dt--rm 2   Vital Signs:  Patient profile:   70 year old male Height:      69.5 inches Weight:      272.50 pounds BMI:     39.81 O2 Sat:      97 % on Room air Temp:     98.1 degrees F oral Pulse rate:   56 / minute Pulse rhythm:   regular Resp:     16 per minute BP sitting:   146 / 84  (right arm) Cuff size:   large  Vitals Entered By: Mervin Kung CMA (AAMA) (April 20, 2010 4:00 PM)  O2 Flow:  Room air CC: Rm 2  Pt states he has had intermittent chest discomfort x 5 days, productive cough. Feels like he can't take a deep breath. Comments Pt agrees all med doses and directions are correct. Nicki Guadalajara Fergerson CMA Duncan Dull)  April 20, 2010 4:13 PM    Primary Care Provider:  Dondra Spry DO  CC:  Rm 2  Pt states he has had intermittent chest discomfort x 5 days and productive cough. Feels like he can't take a deep breath..  History of Present Illness: 70 y/o male with hx of obesity, htn,  borderline DM II c/o 5 days of intermittent left sided chest pain "can't get a deep breath", but not overtly short of breath funny sensation in left upper arm symptoms are non exertional symptoms last 10-20 mins no triggers  no alleviating factors some cough - thicker mucus ,   not discolored no dizziness  pt has hx of atypical chest pain for years stress myoview 07/2009 - normal  some bradycardia prev noted    Allergies: 1)  ! * Ivp Dye  Past History:  Past Medical History: HYPERGLYCEMIA (ICD-790.29) UNSPECIFIED VITAMIN D DEFICIENCY (ICD-268.9) LEG CRAMPS (ICD-729.82) ABDOMINAL PAIN (ICD-789.00) CHEST PAIN (ICD-786.50)  CALCANEAL SPUR (ICD-726.73) VENTRAL HERNIA (ICD-553.20) COUGH (ICD-786.2) URI (ICD-465.9) ACHILLES TENDINITIS, BILATERAL (ICD-726.71) HYPERLIPIDEMIA (ICD-272.4) EXTERNAL HEMORRHOIDS (ICD-455.3) OBESITY (ICD-278.00) DIVERTICULOSIS, COLON (ICD-562.10) HIATAL HERNIA (ICD-553.3) INSOMNIA UNSPECIFIED  (ICD-780.52) ABDOMINAL PAIN, CHRONIC (ICD-789.00) PROSTATE SPECIFIC ANTIGEN, ELEVATED (ICD-790.93) GOUT NOS (ICD-274.9) SLEEP APNEA, OBSTRUCTIVE (ICD-327.23) HYPERTENSION (ICD-401.9) GERD (ICD-530.81) COLON CANCER, HX OF (ICD-V10.05) COLONIC POLYPS, HX OF (ICD-V12.72)  OSA  Lumbar spondylosis Multiple incisional hernia repairs  (mesh) Hx of post prandial nausea  - ? related to gallbladder sludge and gallstones 04/08 PMH reviewed for relevance  Family History: Father died from perforated esphagus, history of CVA. Mother had breast cancer and hypertension.     No FH of Colon Cancer:    Social History: Occupation: Semi Retired Married Former Smoker   Alcohol use-yes 1-2 three times a week Daily Caffeine Use: 2 cups daily  Illicit Drug Use - no  Review of Systems       The patient complains of chest pain and dyspnea on exertion.  The patient denies syncope.    Physical Exam  General:  alert and overweight-appearing.   Head:  normocephalic and atraumatic.   Ears:  R ear normal and L ear normal.   Mouth:  pharynx pink and moist.   Lungs:  normal respiratory effort and normal breath sounds.   Heart:  normal rate, regular rhythm, no murmur, and no gallop.   Abdomen:  soft, non-tender, and normal bowel sounds.   Extremities:  trace left pedal edema and trace right pedal edema.  no calf tenderness Neurologic:  cranial nerves II-XII intact and gait normal.   Psych:  normally interactive, good eye contact, not anxious appearing, and not depressed appearing.     Impression & Recommendations:  Problem # 1:  CHEST PAIN (ICD-786.50) 70 y/o male with chest pain.  refer to cardiology for further studies  Orders: T-2 View CXR, Same Day (71020.5TC) T-Basic Metabolic Panel ([id number]) T-CBC No Diff ([id number]) T- * Misc. Laboratory test [phone]) T-BNP  (B Natriuretic Peptide) ([phone]) Cardiology Referral (Cardiology)  Problem # 2:  SLEEP APNEA, OBSTRUCTIVE  (ICD-327.23)  Orders: Misc. Referral (Misc. Ref)  Problem # 3:  GERD (ICD-530.81) continue PPI Labs Reviewed: Hgb: 13.4 (02/20/2007)   Hct: 38.4 (02/20/2007)  Problem # 4:  HYPERTENSION (ICD-401.9) Assessment: Unchanged  His updated medication list for this problem includes:    Diovan 320 Mg Tabs (Valsartan) ..... One by mouth qd    Amlodipine Besylate 10 Mg Tabs (Amlodipine besylate) .[name] Kitchen... Take 1 tablet by mouth once a day  BP today: 146/84 Prior BP: 128/76 (03/12/2010)  Labs Reviewed: K+: 4.3 (01/19/2010) Creat: : 0.86 (01/19/2010)   Chol: 137 (01/19/2010)   HDL: 47 (01/19/2010)   LDL: 70 (01/19/2010)   TG: 102 (01/19/2010)  Complete Medication List: 1)  Diovan 320 Mg Tabs (Valsartan) .... One by mouth qd 2)  Amlodipine Besylate 10 Mg Tabs (Amlodipine besylate) .... Take 1 tablet by mouth once a day 3)  Allopurinol 100 Mg Tabs (Allopurinol) .... Take 1 tablet by mouth daily. 4)  Simvastatin 10 Mg Tabs (Simvastatin) .... One by mouth once daily 5)  Metformin Hcl 500 Mg Tabs (Metformin hcl) .... One by mouth bid 6)  Afrin Nasal Spray 0.05 % Soln (Oxymetazoline hcl) .[name] Kitchen.. 1 spray each nostril at bedtime. 7)  Aspir-low 81 Mg Tbec (Aspirin) .... Take 1 tablet by mouth once a day 8)  Potassium 99 Mg Tabs (Potassium) .... Take 1 tablet by mouth once a day  Patient Instructions: 1)  Please schedule a follow-up appointment in 1 month. Prescriptions: OMEPRAZOLE-SODIUM BICARBONATE 40-1100 MG CAPS (OMEPRAZOLE-SODIUM BICARBONATE) one by mouth once daily  #30 x 3   Entered and Authorized by:   D. Thomos Lemons DO   Signed by:   D. Thomos Lemons DO on 04/20/2010   Method used:   Electronically to        [id number] (retail)       [address]       [place], [place]  [address]       Ph: [phone]       Fax: [phone]   RxID:   [id number]    Orders Added: 1)  T-2 View CXR, Same Day [71020.5TC] 2)  T-Basic Metabolic Panel [id number] 3)  T-CBC No  Diff [[id number]] 4)  T- * Misc. Laboratory test [99999] 5)  T-BNP  (B Natriuretic Peptide) [[id number]] 6)  Cardiology Referral [Cardiology] 7)  Misc. Referral [Misc. Ref] 8)  Est. Patient Level IV [20254]    Current Allergies (reviewed today): ! * IVP DYE

## 2010-08-04 NOTE — Progress Notes (Signed)
Summary: Triage   Phone Note Call from Patient Call back at Work Phone [phone]   Caller: Patient Call For: Dr. Jarold Motto Reason for Call: Talk to Nurse Summary of Call: Pt is calling b/c he is scheduled for another colooscopy for 08/2009 and had one in Feb. 2010. Wants to know if needs one this soon. Initial call taken by: Karna Christmas,  August 01, 2009 8:36 AM  Follow-up for Phone Call        Colon is due now because of multilpe polyps found at last colon. Pt notified. Follow-up by: Ashok Cordia RN,  August 01, 2009 8:44 AM

## 2010-08-04 NOTE — Assessment & Plan Note (Signed)
Summary: uti/mhf   Vital Signs:  Patient profile:   70 year old male Weight:      277.25 pounds BMI:     40.50 O2 Sat:      96 % on Room air Temp:     98.2 degrees F oral Pulse rate:   63 / minute Pulse rhythm:   regular Resp:     16 per minute BP sitting:   122 / 70  (right arm) Cuff size:   large  Vitals Entered By: Glendell Docker CMA (February 27, 2010 12:01 PM)  O2 Flow:  Room air CC: Urinary Incontinence Is Patient Diabetic? No Pain Assessment Patient in pain? no        Primary Care Provider:  Dondra Spry DO  CC:  Urinary Incontinence.  History of Present Illness: 70 y/o c/o enuresis several night ago which has never happened in the past he states he took a sleep aid the night before he has chronic insomnia and OSA  no other urinary complaints  Preventive Screening-Counseling & Management  Alcohol-Tobacco     Smoking Status: quit  Allergies: 1)  ! * Ivp Dye  Past History:  Past Medical History: Colon cancer, hx of GERD  Hypertension  Abnormal glucose        OSA  Lumbar spondylosis Multiple incisional hernia repairs  (mesh) Hx of post prandial nausea  - ? related to gallbladder sludge and gallstones 04/08 Gout  Past Surgical History: Colon Resection with mesh repair 1986 Vertical banding gastroplasty 1987    Cataract extraction   Knee Surgery         Family History: Father died from perforated esphagus, history of CVA. Mother had breast cancer and hypertension.       Social History: Married Former Smoker   Alcohol use-yes 1-2 drinks per day    Occupation: Associate Professor          Physical Exam  General:  alert, well-developed, and well-nourished.   Lungs:  normal respiratory effort and normal breath sounds.   Heart:  normal rate, regular rhythm, no murmur, and no gallop.   Extremities:  trace left pedal edema and trace right pedal edema.     Impression & Recommendations:  Problem # 1:  NOCTURNAL ENURESIS (ICD-788.36) I suspect  symptoms secondary to use of OTC sleep aid.  he also has OSA stop otc benadryl / tylenol PM use diazepam as needed for sleep trial of low dose melatonin  Patient advised to call office if symptoms persist or worsen.  Complete Medication List: 1)  Omeprazole 20 Mg Cpdr (Omeprazole) .... One cap by mouth once daily ac 2)  Diovan 320 Mg Tabs (Valsartan) .... One by mouth qd 3)  Amlodipine Besylate 10 Mg Tabs (Amlodipine besylate) .... Take 1 tablet by mouth once a day 4)  Allopurinol 100 Mg Tabs (Allopurinol) .... Two by mouth two times a day 5)  Simvastatin 10 Mg Tabs (Simvastatin) .... One by mouth once daily 6)  Metformin Hcl 500 Mg Tabs (Metformin hcl) .... One by mouth bid 7)  Afrin Nasal Spray 0.05 % Soln (Oxymetazoline hcl) .[name] Kitchen.. 1 spray each nostril at bedtime. 8)  Aspir-low 81 Mg Tbec (Aspirin) .... Take 1 tablet by mouth once a day 9)  Potassium 99 Mg Tabs (Potassium) .... Take 1 tablet by mouth once a day 10)  Ergocalciferol 50000 Unit Caps (Ergocalciferol) .... One cap by mouth once weekly 11)  Diazepam 2 Mg Tabs (Diazepam) .... One by mouth at  bedtime as needed for insomnia  Patient Instructions: 1)  Use melatonin 1 mg or 2 mg - cut into quarters and take 1/4 at 4PM and 8 PM x 1 to 2 weeks 2)  Use diazepam sparingly for sleep issues 3)  Call our office if current problem recurs Prescriptions: DIAZEPAM 2 MG TABS (DIAZEPAM) one by mouth at bedtime as needed for insomnia  #30 x 3   Entered and Authorized by:   D. Thomos Lemons DO   Signed by:   D. Thomos Lemons DO on 02/27/2010   Method used:   Print then Give to Patient   RxID:   [id number]   Current Allergies (reviewed today): ! * IVP DYE

## 2010-08-04 NOTE — Assessment & Plan Note (Signed)
Summary: 1 month follow up/mhf   Vital Signs:  Patient profile:   70 year old male Height:      69 inches Weight:      275 pounds BMI:     40.76 O2 Sat:      97 % on Room air Temp:     98.5 degrees F oral Pulse rate:   66 / minute Resp:     18 per minute BP sitting:   130 / 70  (right arm) Cuff size:   large  Vitals Entered By: Glendell Docker CMA (May 27, 2010 10:59 AM)  O2 Flow:  Room air  Contraindications/Deferment of Procedures/Staging:    Test/Procedure: FLU VAX    Reason for deferment: patient declined  CC: 1 Month Follow up  Is Patient Diabetic? No Pain Assessment Patient in pain? no      Comments questions about finasteride, (not scored) cardiology follow up   Primary Care Provider:  DThomos Lemons DO  CC:  1 Month Follow up .  History of Present Illness: 70 y/o white male previously seen for chest pain for followup Patient seen by cardiology Stress test was negative ejection fraction noted to be 51% 2-D echo-            - Left ventricle: The cavity size was normal. Wall thickness was       increased increased in a pattern of mild to moderate LVH. Systolic       function was normal. The estimated ejection fraction was in the       range of 55% to 60%. Wall motion was normal; there were no       regional wall motion abnormalities. Doppler parameters are       consistent with abnormal left ventricular relaxation (grade 1       diastolic dysfunction).     - Mitral valve: Calcified annulus.     - Left atrium: The atrium was moderately dilated.     - Right atrium: The atrium was mildly dilated.     - Atrial septum: No defect or patent foramen ovale was identified.  patient still getting unexplained mild left-sided chest symptoms  patient also complains of alopecia Finasteride started pt also has mild BPH symptoms   Preventive Screening-Counseling & Management  Alcohol-Tobacco     Smoking Status: quit  Allergies: 1)  ! * Ivp Dye  Past  History:  Past Medical History: HYPERGLYCEMIA (ICD-790.29) UNSPECIFIED VITAMIN D DEFICIENCY (ICD-268.9) LEG CRAMPS (ICD-729.82) ABDOMINAL PAIN (ICD-789.00) CHEST PAIN (ICD-786.50)   CALCANEAL SPUR (ICD-726.73) VENTRAL HERNIA (ICD-553.20) COUGH (ICD-786.2) URI (ICD-465.9) ACHILLES TENDINITIS, BILATERAL (ICD-726.71) HYPERLIPIDEMIA (ICD-272.4) EXTERNAL HEMORRHOIDS (ICD-455.3) OBESITY (ICD-278.00) DIVERTICULOSIS, COLON (ICD-562.10) HIATAL HERNIA (ICD-553.3) INSOMNIA UNSPECIFIED (ICD-780.52) ABDOMINAL PAIN, CHRONIC (ICD-789.00) PROSTATE SPECIFIC ANTIGEN, ELEVATED (ICD-790.93) GOUT NOS (ICD-274.9) SLEEP APNEA, OBSTRUCTIVE (ICD-327.23) HYPERTENSION (ICD-401.9) GERD (ICD-530.81) COLON CANCER, HX OF (ICD-V10.05) COLONIC POLYPS, HX OF (ICD-V12.72)  OSA  Lumbar spondylosis Multiple incisional hernia repairs  (mesh) Hx of post prandial nausea  - ? related to gallbladder sludge and gallstones 11-01-2022  Family History: Father died from perforated esphagus, history of CVA. Mother had breast cancer and hypertension.     No FH of Colon Cancer:     Physical Exam  General:  alert, well-developed, and well-nourished.   Head:  mild diffuse alopecia Lungs:  normal respiratory effort and normal breath sounds.   Heart:  normal rate, regular rhythm, and no gallop.   Extremities:  trace left pedal edema and trace right pedal  edema.     Impression & Recommendations:  Problem # 1:  ALOPECIA (ICD-704.00) use finasteride as directed  Problem # 2:  PROSTATE SPECIFIC ANTIGEN, ELEVATED (ICD-790.93) we discussed common side effects of finasteride  monitor PSA Prev PSA in 02/2007  3.42  Complete Medication List: 1)  Diovan 320 Mg Tabs (Valsartan) .... One by mouth qd 2)  Amlodipine Besylate 10 Mg Tabs (Amlodipine besylate) .... Take 1 tablet by mouth once a day 3)  Allopurinol 100 Mg Tabs (Allopurinol) .... Take 1 tablet by mouth daily. 4)  Simvastatin 10 Mg Tabs (Simvastatin) .... One by  mouth once daily 5)  Metformin Hcl 500 Mg Tabs (Metformin hcl) .... One by mouth bid 6)  Afrin Nasal Spray 0.05 % Soln (Oxymetazoline hcl) .[name] Kitchen.. 1 spray each nostril at bedtime. 7)  Aspir-low 81 Mg Tbec (Aspirin) .... Take 1 tablet by mouth once a day 8)  Potassium 99 Mg Tabs (Potassium) .... Take 1 tablet by mouth once a day 9)  Finasteride 5 Mg Tabs (Finasteride) .... One tab by mouth once daily  Patient Instructions: 1)  Please schedule a follow-up appointment in 6 months. 2)  BMP prior to visit, ICD-9:  401.9 3)  HbgA1C prior to visit, ICD-9: 790.29 4)  PSA prior to visit, ICD-9: 790.93 5)  Please return for lab work one (1) week before your next appointment.  Prescriptions: FINASTERIDE 5 MG TABS (FINASTERIDE) one tab by mouth once daily  #90 x 1   Entered and Authorized by:   D. Thomos Lemons DO   Signed by:   D. Thomos Lemons DO on 05/27/2010   Method used:   Electronically to        [id number] (retail)       [address]       [place], [place]  [address]       Ph: [phone]       Fax: [phone]   RxID:   [id number]    Orders Added: 1)  Est. Patient Level III [93235]   Immunization History:  Influenza Immunization History:    Influenza:  declined (05/20/2010)   Immunization History:  Influenza Immunization History:    Influenza:  Declined (05/20/2010)  Current Allergies (reviewed today): ! * IVP DYE

## 2010-08-04 NOTE — Progress Notes (Signed)
Summary: Lab Results  Phone Note Outgoing Call   Summary of Call: Pls call Marcus [name] and let him know that his vitamin D is low.  I would like for him to start a vitamin D supplement once weekly for 12 weeks.  He should return for a vitamin D level (268.9) followed by appointment with Dr Artist Pais in 12 weeks.  Initial call taken by: Lemont Fillers FNP,  October 28, 2009 8:25 AM  Follow-up for Phone Call        patient advised per [facility] instructions, Lab ordered entered for 01/19/2010  Follow-up by: Glendell Docker CMA,  October 28, 2009 12:05 PM  New Problems: UNSPECIFIED VITAMIN D DEFICIENCY (ICD-268.9)   New Problems: UNSPECIFIED VITAMIN D DEFICIENCY (ICD-268.9) New/Updated Medications: ERGOCALCIFEROL 50000 UNIT CAPS (ERGOCALCIFEROL) one cap by mouth once weekly Prescriptions: ERGOCALCIFEROL 50000 UNIT CAPS (ERGOCALCIFEROL) one cap by mouth once weekly  #4 x 2   Entered and Authorized by:   Lemont Fillers FNP   Signed by:   Lemont Fillers FNP on 10/28/2009   Method used:   Electronically to        [id number] (retail)       [address]       [place], [place]  [address]       Ph: [phone]       Fax: [phone]   RxID:   [id number]

## 2010-08-04 NOTE — Letter (Signed)
Summary: Patient Notice- Polyp Results  [facility]  [address] [place], [place] [address]   Phone: [phone]  Fax: [phone]        August 25, 2009 MRN: [id number]    Marcus [name] [address] [place], [place]  [address]    Dear [name],  I am pleased to inform you that the colon polyp(s) removed during your recent colonoscopy was (were) found to be benign (no cancer detected) upon pathologic examination.  I recommend you have a repeat colonoscopy examination in 3_ years to look for recurrent polyps, as having colon polyps increases your risk for having recurrent polyps or even colon cancer in the future.  Should you develop new or worsening symptoms of abdominal pain, bowel habit changes or bleeding from the rectum or bowels, please schedule an evaluation with either your primary care physician or with me.  Additional information/recommendations:  x__ No further action with gastroenterology is needed at this time. Please      follow-up with your primary care physician for your other healthcare      needs.  __ Please call [phone] to schedule a return visit to review your      situation.  __ Please keep your follow-up visit as already scheduled.  __ Continue treatment plan as outlined the day of your exam.  Please call us if you are having persistent problems or have questions about your condition that have not been fully answered at this time.  Sincerely,  Mardella Layman MD [facility]  This letter has been electronically signed by your physician.  Appended Document: Patient Notice- Polyp Results Letter mailed 2.22.11.

## 2010-08-04 NOTE — Letter (Signed)
Summary: Colonoscopy Letter  [facility]  [address] [place], [place] [address]   Phone: [phone]  Fax: [phone]      July 11, 2009 MRN: [id number]   Marcus [name] [address] [place], [place]  [address]   Dear Marcus [name],   According to your medical record, it is time for you to schedule a Colonoscopy. The American Cancer Society recommends this procedure as a method to detect early colon cancer. Patients with a family history of colon cancer, or a personal history of colon polyps or inflammatory bowel disease are at increased risk.  This letter has beeen generated based on the recommendations made at the time of your procedure. If you feel that in your particular situation this may no longer apply, please contact our office.  Please call our office at [phone] to schedule this appointment or to update your records at your earliest convenience.  Thank you for cooperating with [place] to provide you with the very best care possible.   Sincerely,  Vania Rea. Jarold Motto, M.D.  [facility] [phone]

## 2010-08-04 NOTE — Letter (Signed)
Summary: Diabetic Instructions  [facility]  [address] [place], [place] [address]   Phone: [phone]  Fax: [phone]    AADEN BUCKMAN Feb 09, 1941 MRN: [id number]   _x  _   ORAL DIABETIC MEDICATION INSTRUCTIONS  The day before your procedure:   Take your diabetic pill as you do normally  The day of your procedure:   Do not take your diabetic pill    We will check your blood sugar levels during the admission process and again in Recovery before discharging you home  ________________________________________________________________________  _

## 2010-08-04 NOTE — Miscellaneous (Signed)
Summary: [facility] Previsit/prep  Clinical Lists Changes  Medications: Added new medication of MOVIPREP 100 GM  SOLR (PEG-KCL-NACL-NASULF-NA ASC-C) As per prep instructions. - Signed Rx of MOVIPREP 100 GM  SOLR (PEG-KCL-NACL-NASULF-NA ASC-C) As per prep instructions.;  #1 x 0;  Signed;  Entered by: Wyona Almas RN;  Authorized by: Mardella Layman MD [facility];  Method used: Electronically to [facility] # [address]*, [address] [place], [place]  [address], Ph: [phone], Fax: [phone] Observations: Added new observation of ALLERGY REV: Done (08/04/2009 13:39)    Prescriptions: MOVIPREP 100 GM  SOLR (PEG-KCL-NACL-NASULF-NA ASC-C) As per prep instructions.  #1 x 0   Entered by:   Wyona Almas RN   Authorized by:   Mardella Layman MD [facility]   Signed by:   Wyona Almas RN on 08/04/2009   Method used:   Electronically to        [facility] # [address] (retail)       [address]       [place], [place]  [address]       Ph: [phone]       Fax: [phone]   RxID:   [id number]

## 2010-08-04 NOTE — Progress Notes (Signed)
Summary: call pt. cell to gove him stress test results  Phone Note Call from Patient   Caller: Patient Call For: D. Thomos Lemons DO Reason for Call: Lab or Test Results Details for Reason: would like to be called on his cell to gove him his stress test results. Summary of Call: pt. states he had a stress test performed yesterday and would like to know results. You will have to call his cell at [phone] to give him results as soon as you know. Pt. is leaving to go out of town today. Thank you, Michaelle Copas Initial call taken by: Michaelle Copas,  July 08, 2009 11:07 AM  Follow-up for Phone Call        call pt - stress test normal Follow-up by: D. Thomos Lemons DO,  July 08, 2009 11:39 AM  Additional Follow-up for Phone Call Additional follow up Details #1::        informed pt. that stress test is normal and pt. made a f/u appt. to discuss health concerns for 07/15/09 Additional Follow-up by: Michaelle Copas,  July 08, 2009 11:54 AM

## 2010-08-04 NOTE — Miscellaneous (Signed)
Summary: Lab Orders  Clinical Lists Changes  Orders: Added new Test order of T-Vitamin D (25-Hydroxy) [id number]) - Signed

## 2010-08-04 NOTE — Progress Notes (Signed)
Summary: sooner appt   Phone Note Call from Patient Call back at Work Phone [phone]   Caller: Patient Call For: Dr Jarold Motto Reason for Call: Talk to Nurse Summary of Call: Patient states that he was in the hospital from Sun. thru Tues. with a blockage and he was told to f.u with his gi asap.(first available is 10-25) Initial call taken by: Tawni Levy,  March 04, 2010 10:10 AM  Follow-up for Phone Call        LM for pt to call.  Lupita Leash Surface RN  March 04, 2010 10:49 AM  Pt states he is feeling fine now , Was told to follup up with GI.  Appt scheduled. Follow-up by: Ashok Cordia RN,  March 04, 2010 11:18 AM

## 2010-08-04 NOTE — Letter (Signed)
Summary: [facility]  [facility]   Imported By: Lanelle Bal 08/07/2009 11:20:03  _____________________________________________________________________  External Attachment:    Type:   Image     Comment:   External Document

## 2010-08-04 NOTE — Progress Notes (Signed)
Summary: lab result  Phone Note Outgoing Call   Summary of Call: call pt - recent blood tests (CBC, electrolytes, kidney function,  blood clot test and heart failure blood test all normal) Initial call taken by: D. Thomos Lemons DO,  April 21, 2010 1:18 PM  Follow-up for Phone Call        Left message on machine (cell)  to return my call. Also needs to ask pt to verify Allpurinol directions. We have never filled for pt before and pharmacy request does not match directions in EMR. Nicki Guadalajara Fergerson CMA Duncan Dull)  April 21, 2010 1:47 PM   Additional Follow-up for Phone Call Additional follow up Details #1::        Pt notified and was instructed of allopurinol direction change per Dr Artist Pais. Pt voices understanding. Nicki Guadalajara Fergerson CMA Duncan Dull)  April 21, 2010 3:17 PM

## 2010-08-04 NOTE — Assessment & Plan Note (Signed)
Summary: Cardiology Nuclear Testing  Nuclear Med Background Indications for Stress Test: Evaluation for Ischemia  Indications Comments: .  History: Echo, Heart Catheterization, Myocardial Perfusion Study  History Comments: 1/11MPS:normal  Symptoms: Chest Pain, Chest Tightness, DOE, Fatigue, SOB  Symptoms Comments: CP>(L)arm. Last episode of ZO:XWRUEAVWU   Nuclear Pre-Procedure Cardiac Risk Factors: History of Smoking, Hypertension, Lipids, NIDDM, Obesity Caffeine/Decaff Intake: None NPO After: 7:00 PM Lungs: Clear IV 0.9% NS with Angio Cath: 22g     IV Site: R Wrist IV Started by: Bonnita Levan, RN Chest Size (in) 52     Height (in): 69 Weight (lb): 270 BMI: 40.02  Nuclear Med Study 1 or 2 day study:  1 day     Stress Test Type:  Stress Reading MD:  Arvilla Meres, MD     Referring MD:  Charlies Constable, MD Resting Radionuclide:  Technetium 96m Tetrofosmin     Resting Radionuclide Dose:  11 mCi  Stress Radionuclide:  Technetium 63m Tetrofosmin     Stress Radionuclide Dose:  33 mCi   Stress Protocol Exercise Time (min):  6:45 min     Max HR:  134 bpm     Predicted Max HR:  150 bpm  Max Systolic BP: 193 mm Hg     Percent Max HR:  89.33 %     METS: 7.6 Rate Pressure Product:  98119    Stress Test Technologist:  Rea [employer], CMA-N     Nuclear Technologist:  Domenic Polite, CNMT  Rest Procedure  Myocardial perfusion imaging was performed at rest 45 minutes following the intravenous administration of Technetium 52m Tetrofosmin.  Stress Procedure  The patient exercised for 6:45.  The patient stopped due to fatigue and denied any chest pain.  There were no diagnostic ST-T wave changes, only rare PVC's and PAC's.  Technetium 59m Tetrofosmin was injected at peak exercise and myocardial perfusion imaging was performed after a brief delay.  QPS Raw Data Images:  Normal; no motion artifact; normal heart/lung ratio. Stress Images:  Normal homogeneous uptake in all areas of the  myocardium. Rest Images:  Normal homogeneous uptake in all areas of the myocardium. Subtraction (SDS):  Normal Transient Ischemic Dilatation:  .92  (Normal <1.22)  Lung/Heart Ratio:  .28  (Normal <0.45)  Quantitative Gated Spect Images QGS EDV:  118 ml QGS ESV:  58 ml QGS EF:  51 % QGS cine images:  Septal hypokinesis  Findings Normal nuclear study      Overall Impression  Exercise Capacity: Fair exercise capacity. BP Response: Normal blood pressure response. Clinical Symptoms: Dyspnea. ECG Impression: No significant ST segment change suggestive of ischemia. Overall Impression: Normal stress nuclear study. Overall Impression Comments: Normal perfusion. + septal hypokinesis of GATED spect imaging.   Appended Document: Cardiology Nuclear Testing Pt notifed of results through Dr. Olegario Messier office.

## 2010-08-04 NOTE — Assessment & Plan Note (Signed)
Summary: f/u after seen in E.R. with stomach pain- jr   Vital Signs:  Patient profile:   70 year old male Height:      69.5 inches Weight:      277 pounds BMI:     40.47 O2 Sat:      97 % on Room air Temp:     98.2 degrees F oral Pulse rate:   58 / minute Pulse rhythm:   regular Resp:     18 per minute BP sitting:   130 / 72  (right arm) Cuff size:   large  Vitals Entered By: Glendell Docker CMA (September 25, 2009 3:54 PM)  O2 Flow:  Room air CC: Rm 3- ER Follow  up    Primary Care Provider:  DThomos Lemons DO  CC:  Rm 3- ER Follow  up .  History of Present Illness: 70 y/o white male for ER followup pt c/o lower abd pain.  feels sharp.  described as spasm symptoms started after eating slice of pizza  he reports recent issues with conspitation he tried taking laxative symptoms gradually got worse  ER records reviewed.  pt given morphine x 2 CBCD,  LFT's, Lipase - normal CT of abd and pelvis - no acute findings   Pt  still having mild lower abdominal  intermittent spasms no nausea or vomiting  Allergies: 1)  ! * Ivp Dye  Past History:  Past Medical History: Colon cancer, hx of GERD  Hypertension  Abnormal glucose      OSA  Lumbar spondylosis Multiple incisional hernia repairs  (mesh) Hx of post prandial nausea  - ? related to gallbladder sludge and gallstones 04/08 Gout  Past Surgical History: Colon Resection with mesh repair 1986 Vertical banding gastroplasty 1987  Cataract extraction   Knee Surgery         Family History: Father died from perforated esphagus, history of CVA. Mother had breast cancer and hypertension.     Social History: Married Former Smoker  Alcohol use-yes 1-2 drinks per day    Occupation: Associate Professor        Physical Exam  General:  alert, well-developed, and well-nourished.   Lungs:  normal respiratory effort and normal breath sounds.   Heart:  normal rate, regular rhythm, no murmur, and no gallop.   Abdomen:  obese, mid  line scar possible scar tissue (1-2 cm firm area) right of umbilicus no obvious hernia diathesis recti   Impression & Recommendations:  Problem # 1:  ABDOMINAL PAIN (ICD-789.00) Pt with sharp intermittent right sided abd pain.  I suspect pt has abd adhesions.  trial of antispasmodic.   He will try shifting positions if abd pain recurrs. If symptoms get worse, surgical referral.  Complete Medication List: 1)  Omeprazole 20 Mg Cpdr (Omeprazole) .... One cap by mouth once daily ac 2)  Diovan 320 Mg Tabs (Valsartan) .... One by mouth qd 3)  Amlodipine Besylate 10 Mg Tabs (Amlodipine besylate) .... Take 1 tablet by mouth once a day 4)  Allopurinol 100 Mg Tabs (Allopurinol) .... Two by mouth qd 5)  Simvastatin 40 Mg Tabs (Simvastatin) .... One by mouth once daily 6)  Metformin Hcl 500 Mg Tabs (Metformin hcl) .... One by mouth bid 7)  Nasonex 50 Mcg/act Susp (Mometasone furoate) .... 2 sprays to each nostril qd 8)  Hyoscyamine Sulfate 0.125 Mg Tabs (Hyoscyamine sulfate) .... One by mouth three times a day as needed for abdominal pain  Patient Instructions: 1)  Call our office if your symptoms do not  improve or gets worse. Prescriptions: HYOSCYAMINE SULFATE 0.125 MG TABS (HYOSCYAMINE SULFATE) one by mouth three times a day as needed for abdominal pain  #30 x 1   Entered and Authorized by:   D. Thomos Lemons DO   Signed by:   D. Thomos Lemons DO on 09/25/2009   Method used:   Electronically to        [facility] # [address] (retail)       [address]       [place], [place]  [address]       Ph: [phone]       Fax: [phone]   RxID:   [id number]   Current Allergies (reviewed today): ! * IVP DYE

## 2010-08-04 NOTE — Progress Notes (Signed)
Summary: would be like rx   Phone Note Call from Patient Call back at Work Phone [phone]   Caller: patient  Call For: yoo  Summary of Call: patient is losing hair on top of his head and would like an rx for propecia.  [facility]  Initial call taken by: Roselle Locus,  May 20, 2010 3:42 PM  Follow-up for Phone Call        I suggest pt use generic finasteride.  take 1/2 of 5 mg.  (less expensive than propecia) Follow-up by: D. Thomos Lemons DO,  May 21, 2010 1:11 PM  Additional Follow-up for Phone Call Additional follow up Details #1::        call returned to  patient at [phone], no answer. A detalied voice message was  left informing patient per Dr Artist Pais instructions. Message was left for patient to return call if any questions Additional Follow-up by: Glendell Docker CMA,  May 21, 2010 1:22 PM    New/Updated Medications: FINASTERIDE 5 MG TABS (FINASTERIDE) one half tab by mouth once daily Prescriptions: FINASTERIDE 5 MG TABS (FINASTERIDE) one half tab by mouth once daily  #30 x 3   Entered and Authorized by:   D. Thomos Lemons DO   Signed by:   D. Thomos Lemons DO on 05/21/2010   Method used:   Electronically to        [facility] # [address] (retail)       [address]       [place], [place]  [address]       Ph: [phone]       Fax: [phone]   RxID:   [id number]

## 2010-08-04 NOTE — Progress Notes (Signed)
Summary: NUC PRE-PROCEDURE  Phone Note Outgoing Call   Call placed by: Domenic Polite, CNMT,  April 22, 2010 1:49 PM Call placed to: Patient Reason for Call: Confirm/change Appt Summary of Call: Reviewed information on Myoview Information Sheet (see scanned document for further details).  Spoke with patient's wife.  Initial call taken by: Domenic Polite, CNMT,  April 22, 2010 1:49 PM     Nuclear Med Background Indications for Stress Test: Evaluation for Ischemia  Indications Comments: 07/01/09 CP, (-) enzymes  History: Echo, Heart Catheterization, Myocardial Perfusion Study  History Comments:  ~'00 Cath:([place]- normal); 12/10 Echo:EF= 55-65%; h/o OSA, 1/11 NML MPI EF=62%  Symptoms: Chest Pain, SOB  Symptoms Comments: CP > LT ARM PAIN   Nuclear Pre-Procedure Cardiac Risk Factors: History of Smoking, Hypertension, Lipids, NIDDM, Obesity Height (in): 69  Nuclear Med Study Referring MD:  Thomos Lemons, MD

## 2010-08-04 NOTE — Assessment & Plan Note (Signed)
Summary: Routine F/U/dt   Vital Signs:  Patient profile:   70 year old male Height:      69.5 inches Weight:      277 pounds BMI:     40.47 O2 Sat:      97 % on Room air Temp:     97.9 degrees F oral Pulse rate:   57 / minute Pulse rhythm:   regular Resp:     18 per minute BP sitting:   122 / 70  (right arm) Cuff size:   large  Vitals Entered By: Glendell Docker CMA (January 19, 2010 11:04 AM)  O2 Flow:  Room air CC: Rm 3- Folllow up on labs Is Patient Diabetic? No Pain Assessment Patient in pain? no      Comments request lipid check, fasting for labs   Primary Care Provider:  D. Thomos Lemons DO  CC:  Rm 3- Folllow up on labs.  History of Present Illness:  Hypertension Follow-Up      This is a 70 year old man who presents for Hypertension follow-up.  The patient denies lightheadedness and edema.  The patient denies the following associated symptoms: chest pain.  Compliance with medications (by patient report) has been near 100%.  The patient reports that dietary compliance has been fair.    Obestiy / abnormal glucose - difficulty with appetite control  Preventive Screening-Counseling & Management  Alcohol-Tobacco     Smoking Status: quit  Allergies: 1)  ! * Ivp Dye  Past History:  Past Medical History: Colon cancer, hx of GERD  Hypertension  Abnormal glucose       OSA  Lumbar spondylosis Multiple incisional hernia repairs  (mesh) Hx of post prandial nausea  - ? related to gallbladder sludge and gallstones 04/08 Gout  Past Surgical History: Colon Resection with mesh repair 1986 Vertical banding gastroplasty 1987   Cataract extraction   Knee Surgery         Family History: Father died from perforated esphagus, history of CVA. Mother had breast cancer and hypertension.      Social History: Married Former Smoker  Alcohol use-yes 1-2 drinks per day    Occupation: Associate Professor          Physical Exam  General:  alert and overweight-appearing.     Neck:  supple, no masses, and no carotid bruits.   Lungs:  normal respiratory effort and normal breath sounds.   Heart:  normal rate, regular rhythm, no murmur, and no gallop.   Extremities:  trace left pedal edema and trace right pedal edema.     Impression & Recommendations:  Problem # 1:  HYPERTENSION (ICD-401.9) Assessment Unchanged  His updated medication list for this problem includes:    Diovan 320 Mg Tabs (Valsartan) ..... One by mouth qd    Amlodipine Besylate 10 Mg Tabs (Amlodipine besylate) .[name] Kitchen... Take 1 tablet by mouth once a day  Orders: T-Basic Metabolic Panel [id number])  BP today: 122/70 Prior BP: 126/78 (10/27/2009)  Labs Reviewed: K+: 4.4 (10/27/2009) Creat: : 1.15 (10/27/2009)   Chol: 128 (04/01/2009)   HDL: 44 (04/01/2009)   LDL: 57 (04/01/2009)   TG: 135 (04/01/2009)  Problem # 2:  HYPERLIPIDEMIA (ICD-272.4) decrease simvastatin dose due to potential interaction with amlodipine  His updated medication list for this problem includes:    Simvastatin 10 Mg Tabs (Simvastatin) ..... One by mouth once daily  Orders: T-Hepatic Function [id number]) T-Lipid Profile [id number])  Labs Reviewed: SGOT: 13 (04/01/2009)   SGPT: 15 (  04/01/2009)   HDL:44 (04/01/2009), 26.9 (02/22/2008)  LDL:57 (04/01/2009), 93 (02/22/2008)  Chol:128 (04/01/2009), 138 (02/22/2008)  Trig:135 (04/01/2009), 89 (02/22/2008)  Complete Medication List: 1)  Omeprazole 20 Mg Cpdr (Omeprazole) .... One cap by mouth once daily ac 2)  Diovan 320 Mg Tabs (Valsartan) .... One by mouth qd 3)  Amlodipine Besylate 10 Mg Tabs (Amlodipine besylate) .... Take 1 tablet by mouth once a day 4)  Allopurinol 100 Mg Tabs (Allopurinol) .... Two by mouth qd 5)  Simvastatin 10 Mg Tabs (Simvastatin) .... One by mouth once daily 6)  Metformin Hcl 500 Mg Tabs (Metformin hcl) .... One by mouth bid 7)  Afrin Nasal Spray 0.05 % Soln (Oxymetazoline hcl) .[name] Kitchen.. 1 spray each nostril at bedtime. 8)  Aspir-low 81 Mg  Tbec (Aspirin) .... Take 1 tablet by mouth once a day 9)  Potassium 99 Mg Tabs (Potassium) .... Take 1 tablet by mouth once a day 10)  Ergocalciferol 50000 Unit Caps (Ergocalciferol) .... One cap by mouth once weekly  Other Orders: T- Hemoglobin A1C ([id number])  Patient Instructions: 1)  Please schedule a follow-up appointment in 6 months. Prescriptions: SIMVASTATIN 10 MG TABS (SIMVASTATIN) one by mouth once daily  #90 x 1   Entered and Authorized by:   D. Thomos Lemons DO   Signed by:   D. Thomos Lemons DO on 01/19/2010   Method used:   Electronically to        [facility] # [address] (retail)       [address]       [place], [place]  [address]       Ph: [phone]       Fax: [phone]   RxID:   [id number]   Current Allergies (reviewed today): ! * IVP DYE

## 2010-08-04 NOTE — Procedures (Signed)
Summary: Colonoscopy  Patient: Marcus [name] Note: All result statuses are Final unless otherwise noted.  Tests: (1) Colonoscopy (COL)   COL Colonoscopy           DONE     [facility]     [address]     [place], [place]  [address]           COLONOSCOPY PROCEDURE REPORT           PATIENT:  [name], [name]  MR#:  [id number]     BIRTHDATE:  1940-08-16, 68 yrs. old  GENDER:  male           ENDOSCOPIST:  Vania Rea. Jarold Motto, MD, [facility]     Referred by:           PROCEDURE DATE:  08/20/2009     PROCEDURE:  Colonoscopy with snare polypectomy     ASA CLASS:  Class III     INDICATIONS:  history of colon cancer, history of pre-cancerous     (adenomatous) colon polyps           MEDICATIONS:   Fentanyl 50 mcg IV, Versed 6 mg IV           DESCRIPTION OF PROCEDURE:   After the risks benefits and     alternatives of the procedure were thoroughly explained, informed     consent was obtained.  Digital rectal exam was performed and     revealed no abnormalities.   The LB [id number] endoscope     was introduced through the anus and advanced to the cecum, which     was identified by both the appendix and ileocecal valve, without     limitations.  The quality of the prep was adequate, using     MoviPrep.  The instrument was then slowly withdrawn as the colon     was fully examined.     <<PROCEDUREIMAGES>>           FINDINGS:  There was a surgical anastomosis. sigmoid anastomosis     widely patent.appears normal.  Moderate diverticulosis was found     sigmoid to descending  There were multiple polyps identified and     removed. transverse to sigmoid small 2-34mm flat polyps cold snare     excised.  This was otherwise a normal examination of the colon.     Retroflexed views in the rectum revealed no abnormalities.    The     scope was then withdrawn from the patient and the procedure     completed.           COMPLICATIONS:  None           ENDOSCOPIC IMPRESSION:     1)  Moderate diverticulosis in the sigmoid to descending     2) Polyps, multiple in the transverse to sigmoid     3) Otherwise normal examination     prior sigmoid resection for cancer 1n 1986.multiple recurrent     polps.R/O ADENOMAS.     RECOMMENDATIONS:     F/U 3 YEARS.           REPEAT EXAM:  No           ______________________________     Vania Rea. Jarold Motto, MD, [facility] Graham           CC:  Thomos Lemons, DO           n.     eSIGNED:   Vania Rea. Manika Hast at 08/20/2009 09:42  AM           [name], [name], [id number]  Note: An exclamation mark (!) indicates a result that was not dispersed into the flowsheet. Document Creation Date: 08/20/2009 9:42 AM _______________________________________________________________________  (1) Order result status: Final Collection or observation date-time: 08/20/2009 09:35 Requested date-time:  Receipt date-time:  Reported date-time:  Referring Physician:   Ordering Physician: Sheryn Bison [phone]) Specimen Source:  Source: Launa Grill Order Number: [id number] Lab site:   Appended Document: Colonoscopy     Procedures Next Due Date:    Colonoscopy: 09/2012

## 2010-08-04 NOTE — Assessment & Plan Note (Signed)
Summary: Cardiology Nuclear Study  Nuclear Med Background Indications for Stress Test: Evaluation for Ischemia, Post Hospital  Indications Comments: 07/01/09 CP, (-) enzymes  History: Echo, Heart Catheterization  History Comments:  ~'00 Cath:([place]- normal); 12/10 Echo:EF= 55-65%; h/o OSA  Symptoms: Chest Pain  Symptoms Comments: Last episode of IH:KVQQ am, 1-2/10.   Nuclear Pre-Procedure Cardiac Risk Factors: History of Smoking, Hypertension, Lipids, NIDDM, Obesity Caffeine/Decaff Intake: None  NPO After: 8:00 PM Lungs: Clear IV 0.9% NS with Angio Cath: 22g     IV Site: (R) AC IV Started by: Irean Hong RN Chest Size (in) 52     Height (in): 69.5 Weight (lb): 275 BMI: 40.17 Tech Comments: No meds. taken today.  Nuclear Med Study 1 or 2 day study:  1 day     Stress Test Type:  Eugenie Birks Reading MD:  Olga Millers, MD     Referring MD:  Thomos Lemons, MD Resting Radionuclide:  Technetium 22m Tetrofosmin     Resting Radionuclide Dose:  11.0 mCi  Stress Radionuclide:  Technetium 63m Tetrofosmin     Stress Radionuclide Dose:  33.0 mCi   Stress Protocol   Lexiscan: 0.4 mg   Stress Test Technologist:  Rea College CMA-N     Nuclear Technologist:  Harlow Asa CNMT  Rest Procedure  Myocardial perfusion imaging was performed at rest 45 minutes following the intravenous administration of Myoview Technetium 12m Tetrofosmin.  Stress Procedure  The patient received IV Lexiscan 0.4 mg over 15-seconds.  Myoview injected at 30-seconds.  There were no significant changes with infusion.  Quantitative spect images were obtained after a 45 minute delay.  QPS Raw Data Images:  Acuisition technically good; normal left ventricular size. Stress Images:  There is normal uptake in all areas. Rest Images:  Normal homogeneous uptake in all areas of the myocardium. Subtraction (SDS):  No evidence of ischemia. Transient Ischemic Dilatation:  .97  (Normal <1.22)  Lung/Heart Ratio:  .30  (Normal  <0.45)  Quantitative Gated Spect Images QGS EDV:  136 ml QGS ESV:  52 ml QGS EF:  62 % QGS cine images:  Normal wall motion.   Overall Impression  Exercise Capacity: Lexiscan study with no exercise. ECG Impression: No significant ST segment change suggestive of ischemia. Overall Impression: There is no sign of scar or ischemia.

## 2010-08-04 NOTE — Consult Note (Signed)
Summary: Dr. Ezzard Standing    NAME:  Marcus [name], Marcus [name]               ACCOUNT NO.:  [id number]      MEDICAL RECORD NO.:  [id number]          PATIENT TYPE:  INP      LOCATION:  1417                         FACILITY:  [facility]      PHYSICIAN:  Sandria Bales. Ezzard Standing, M.D.  DATE OF BIRTH:  1940/08/15      DATE OF CONSULTATION: 01 March 2010                                    CONSULTATION      REFERRING PHYSICIAN:  Rhae Lerner. Margretta Ditty, M.D.      REASON FOR CONSULTATION:  Bowel obstruction.      HISTORY OF PRESENT ILLNESS:  This is a 70 year old white male who has a   somewhat complicated surgical history.  He is a patient of Dr. Thomos Lemons from a primary medical standpoint, has seen Dr. Sheryn Bison from   a gastrointestinal standpoint, and Dr. Sheran Luz from a   osteoarthritis standpoint.      He presents to the [facility] Emergency Room with a 3-day history of   pain and obstipation.  A CT scan done today read by Dr. Lacy Duverney   suggest dilated proximal small bowel consistent with a probable partial   bowel obstruction.      The patient has a history as he had a stomach stapling by   Dr. Dollene Cleveland in  [place], [place], about 1986.  Please note, this was   not a gastric bypass. Whether this was a vertical banded gastroplasty is unclear. About a year or 2 later in 1988 or 1989, he  had a sigmoid colectomy for colon cancer.  He has been disease free   since that time.      And then in about 2006, he underwent a laparoscopic repair of a ventral   hernia in [place], [place].  He moved down here around 2006/2007 because of the weather   and he now has 3 or 4 children living in this area.      He has been seen or followed by Dr. Sheryn Bison for episodic   abdominal pain.  He apparently does have gallstones.  A HIDA  scan done   on Nov 10, 2006, however, showed a normal ejection fraction.  He saw Dr.   Manus Rudd around this time for the abdominal pain who felt his abdominal pain was   not related to his gallbladder.  He has also had a couple of upper GIs,   one on the Nov 11, 2006, and other on the February 06, 2007, which showed a   hiatal hernia, gastroesophageal reflux disease, and raised the question   of a duodenal ulcer.  It looks like his last endoscopy was in 2008.   There is no mention of ulcer and the patient not aware of having any   ulcer disease.      According to Mr. Stan, he has occasional abdominal pain.  It seems to   come on about once a month.  However, this pain in the last 3 days has   been  different and this is what brought him to the  emergency room.  He   thinks his last colonoscopy was within a year, but he is not totally   sure of that.      PAST MEDICAL HISTORY:      ALLERGIES:  IVP DYE.      CURRENT MEDICATIONS:   1. Metformin 500 mg daily.   2. Simvastatin 10 mg daily.   3. Diovan 320 mg daily.   4. Omeprazole daily.   5. Allopurinol 100 mg daily.   6. Amlodipine 10 mg daily.      REVIEW OF SYSTEMS:     NEUROLOGIC:  He has had no history of seizure or   loss of consciousness.     CARDIAC:  He has been hypertensive for about 5   years, but no history of chest pain or cardiac evaluation such as   catheterization.     PULMONARY:  He has had pneumonia, but treat this as an   outpatient though most recent 2 or 3 years ago.  He is on CPAP for sleep   apnea.  This was put on before he moved to [place] over 5 years ago.   He otherwise was seen by the Pulmonary from [facility].   GASTROINTESTINAL:  In addition to the previous mentioned stomach   stapling, colon re-cancer, and ventral hernia repair, he does have the   gallstones.  I think he has diverticulosis as noted on colonoscopy.   UROLOGIC:  No kidney stones or kidney infection.     MUSCULOSKELETAL:  He had a L3-L4 laminotomy by Dr. Jordan Likes in March 2008, it does sound like he   is not being followed by Dr. Jordan Likes any more.  He had a right shoulder   arthroscopy by Dr. Thomasena Edis in 2008.   Apparently, Dr. Thomasena Edis and Dr.   Ethelene Hal should follow him for his right knee/leg.  He has some right   hip trouble that he is being evaluated by Dr. Ethelene Hal right now.      His wife was in the room when I examined him.  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  He has 4 children, 3 of again who live in this   area.      PHYSICAL EXAMINATION:  VITAL SIGNS:  Temperature is 97.7, pulse 62,   blood pressure 160/88.   GENERAL:  He is a well-nourished, if not obese white male.  He says his   weight is 265.  His height is 5 feet 9 inches.  His biggest  BMI about   39.1.  He really never lost much weight with the stomach stapling, but   his wife said, initially he did.   HEENT:  Unremarkable.  He has an NG tube in place.   NECK:  Supple without mass or thyromegaly.   LUNGS:  Show clear to auscultation.   HEART:  Has regular rate and rhythm, without murmur or rub.   ABDOMEN:  He is actually fairly soft right now.  He has got active bowel   sounds.  He has a well-healed midline incision.  I feel no hernia or   mass, but he is obese, which I think limits the physical exam.   RECTAL:  I did not do a rectal exam on him.   EXTREMITIES:  He has good strength in all 4 extremities.   NEUROLOGIC:  He is grossly intact.      LABORATORY DATA:  Labs  that I have show a white blood count of 7000,   hemoglobin of 14, hematocrit 43.  His platelet count was 215,000.  His   sodium is 142, potassium 3.8, chloride of 111, glucose of 112, BUN of   19, creatinine 1.2.  His lipase was 25.  His CK-MB was 2.6 with a   troponin of less than 0.1.      IMPRESSION:   1. Small bowel obstruction per CT scan.  The patient with a complex       surgical history.  He has also had recurrent abdominal symptoms who etiology is not clear. I       agree with current plan of NG tube IV fluids.  Repeat labs and x-       rays in the morning and hopefully this will resolve without       surgical intervention.   2. History  of stomach stapling and with some chronic stomach issues       due to this, but nothing acute.   3. Right liver lesion seen on CT scan.  We will review with the       Radiology and probably since I have a followup.   4. Gallstones, which have been deemed asymptomatic and with no planned       surgery.   5. History of colon cancer, but he has been disease free for over 20       years.   6. Sleep apnea, on CPAP.   7. Right hip pain, initially he was followed by Dr. Ethelene Hal presumedly       secondary to arthritis.   8. Hypertension.   9. Leg cramps that may be related to the hip, though I think       apparently we will get a vascular evaluation.   10.Gout, which is stable.   11.Hiatal hernia.   12.Diverticulosis.   13.Borderline diabetes.   14.Morbid obesity.         Sandria Bales. Ezzard Standing, M.D., FACS         DHN/MEDQ  D:  03/01/2010  T:  03/02/2010  Job:  [id number]      cc:   Richarda Overlie, MD      Barbette Hair. [place], DO   [address] [place], [place] [address]      Vania Rea. Jarold Motto, MD, FACG, FACP, FAGA   [address]   [place]   [place] [address]      Caralyn Guile. Ethelene Hal, M.D.   Fax: [phone]      Electronically Signed by Ovidio Kin M.D. on 03/03/2010 07:42:27 AM

## 2010-08-04 NOTE — Progress Notes (Signed)
Summary: Pt called with SOB   Phone Note Call from Patient   Caller: Patient Details for Reason: SOB Summary of Call: Pt called with chest discomfort and some SOB - "  has had this before  went to ED"  .  Pt was told that our protocol was to instruct pt to go to ED- he refused, wants only to see Dr Artist Pais tomorrow am   Appt scheduled 9am  Initial call taken by: Darral Dash,  April 20, 2010 12:53 PM  Follow-up for Phone Call        SW pt's wife, advised her that Dr Artist Pais would like pt to make an OV this afternoon, she said she would tell him when he got back from the phone store Diane Tomerlin  April 20, 2010 1:43 PM Pt called back, made OV for this afternoon 10/17 for 4PM Diane Tomerlin  April 20, 2010 2:12 PM

## 2010-08-04 NOTE — Assessment & Plan Note (Signed)
Summary: LEG CRAMPS/MHF   Vital Signs:  Patient profile:   70 year old male Height:      69.5 inches Weight:      275.25 pounds BMI:     40.21 Temp:     98.0 degrees F oral Pulse rate:   84 / minute Pulse rhythm:   regular Resp:     16 per minute BP sitting:   126 / 78  (right arm) Cuff size:   large  Vitals Entered By: Mervin Kung CMA (October 27, 2009 3:55 PM) CC: room 4  Pt states he has had intermittent feet and calf cramping x 2 years. Now has cramping on inner thighs. Cramps only seem to occur at night. Is Patient Diabetic? Yes   Primary Care Provider:  Dondra Spry DO  CC:  room 4  Pt states he has had intermittent feet and calf cramping x 2 years. Now has cramping on inner thighs. Cramps only seem to occur at night..  History of Present Illness: Mr Lindamood is a 70 year old male who presents with c/o nocturnal cramping in bilateral feet/calfs- worse at night.  Notes that the pain is sometimes severe in th the inner thigh leaving his legs sore the next day.    Cramping lasts 5-20 minutes and is debilitating when it occurs. Denies pain with walking.  These symptoms started several years ago but were infrequent in nature.  Symptoms have become more frequent recently  Patient notes + history of low back pain.  Notes hx spinal surgery 2008 Right L3-4 decompressive laminotomy with foraminotomy and microdiskectomy. (Dr. Dutch Quint).  Patient is scheduled to see Dr. Ethelene Hal for his back on Saturday.    Allergies: 1)  ! * Ivp Dye  Physical Exam  General:  Obese white male in NAD Pulses:  R posterior tibial normal, R dorsalis pedis normal, L posterior tibial normal, and L dorsalis pedis normal.   Extremities:  no calf tenderness to palpation.  2+ bilateral LE edema.  Neurologic:  alert & oriented X3 and strength normal in lower extremities.  2+ patellar reflexes bilaterally.   Impression & Recommendations:  Problem # 1:  LEG CRAMPS (ICD-729.82) Assessment Deteriorated Recommended  adequate hydration and stretching exercises before bed.  Will check labs as listed.  Pt to follow up in 1 month with Dr. Artist Pais.  He appears to be due for follow up of his diabetes.   Orders: TLB-CK Total Only(Creatine Kinase/CPK) (82550-CK) T-Assay of Vitamin D ([address]) T-Basic Metabolic Panel ([address]-91478)  Complete Medication List: 1)  Omeprazole 20 Mg Cpdr (Omeprazole) .... One cap by mouth once daily ac 2)  Diovan 320 Mg Tabs (Valsartan) .... One by mouth qd 3)  Amlodipine Besylate 10 Mg Tabs (Amlodipine besylate) .... Take 1 tablet by mouth once a day 4)  Allopurinol 100 Mg Tabs (Allopurinol) .... Two by mouth qd 5)  Simvastatin 40 Mg Tabs (Simvastatin) .... One by mouth once daily 6)  Metformin Hcl 500 Mg Tabs (Metformin hcl) .... One by mouth bid 7)  Afrin Nasal Spray 0.05 % Soln (Oxymetazoline hcl) .[name] Kitchen.. 1 spray each nostril at bedtime. 8)  Aspir-low 81 Mg Tbec (Aspirin) .... Take 1 tablet by mouth once a day 9)  Potassium Gluconate 595 Mg Cr-tabs (Potassium gluconate) .... Take 1 tablet by mouth once a day  Patient Instructions: 1)  We will call you if your lab work is abnormal. 2)  Please follow up if symptoms worsen or do not improve. 3)  Drink  8 glasses of water a day.   4)  Do stretching exercises before bed each night. 5)  Follow up with Dr. Artist Pais in 1 month  Current Allergies (reviewed today): ! * IVP DYE

## 2010-08-04 NOTE — Progress Notes (Signed)
Summary: Allopurinol clarification  Phone Note Refill Request Message from:  [facility] on April 21, 2010 1:39 PM  Refills Requested: Medication #1:  ALLOPURINOL 100 MG  TABS two by mouth two times a day Received electronic refill request for Allopurinol 1 two times a day. Emr med list has Allopurinol 2 tablets two times a day. Which is correct? Please advise. Nicki Guadalajara Fergerson CMA Duncan Dull)  April 21, 2010 1:40 PM    Follow-up for Phone Call        plz change to one daily Follow-up by: D. Thomos Lemons DO,  April 21, 2010 3:08 PM  Additional Follow-up for Phone Call Additional follow up Details #1::        Pt notified of  medication direction change.  Refill sent to pharmacy. Nicki Guadalajara Fergerson CMA Duncan Dull)  April 21, 2010 4:33 PM     New/Updated Medications: ALLOPURINOL 100 MG  TABS (ALLOPURINOL) Take 1 tablet by mouth daily. Prescriptions: ALLOPURINOL 100 MG  TABS (ALLOPURINOL) Take 1 tablet by mouth daily.  #30 x 3   Entered by:   Mervin Kung CMA (AAMA)   Authorized by:   D. Thomos Lemons DO   Signed by:   Mervin Kung CMA (AAMA) on 04/21/2010   Method used:   Electronically to        [facility] [address] (retail)       [address]       [place], [place]  [address]       Ph: [phone]       Fax: [phone]   RxID:   [id number]

## 2010-08-04 NOTE — Miscellaneous (Signed)
Summary: Eye Exam  Clinical Lists Changes  Observations: Added new observation of DMEYEEXAMNXT: 09/2010 (09/16/2009 17:01) Added new observation of DMEYEEXMRES: normal (09/11/2009 17:02) Added new observation of EYE EXAM BY: [facility] [phone] (09/11/2009 17:02) Added new observation of DIAB EYE EX: normal (09/11/2009 17:02)       Diabetes Management Exam:    Eye Exam:       Eye Exam done elsewhere          Date: 09/11/2009          Results: normal          Done by: [facility] [phone]

## 2010-08-04 NOTE — Letter (Signed)
Summary: Marcus [name]   Imported By: Lanelle Bal 09/23/2009 08:45:34  _____________________________________________________________________  External Attachment:    Type:   Image     Comment:   External Document

## 2010-08-04 NOTE — Letter (Signed)
   [place] at [facility] [address] [place], [place]  [address]  [place] Phone: [phone]      January 20, 2010   Marcus [name] [address] [place], [place] [address]  RE:  LAB RESULTS  Dear  [name],  The following is an interpretation of your most recent lab tests.  Please take note of any instructions provided or changes to medications that have resulted from your lab work.  ELECTROLYTES:  Good - no changes needed  KIDNEY FUNCTION TESTS:  Good - no changes needed  LIVER FUNCTION TESTS:  Good - no changes needed  LIPID PANEL:  Stable - no changes needed Triglyceride: 102   Cholesterol: 137   LDL: 70   HDL: 47   Chol/HDL%:  2.9 Ratio   DIABETIC STUDIES:  Good - no changes needed Blood Glucose: 89   HgbA1C: 5.8          Sincerely Yours,    Dr. Thomos Lemons  Appended Document:  letter mailed.

## 2010-08-04 NOTE — Assessment & Plan Note (Signed)
Summary: Follow up bowel obstruction/dfs    History of Present Illness Visit Type: new patient  Primary GI MD: Sheryn Bison MD FACP FAGA Primary Provider: Dondra Spry DO Requesting Provider: na Chief Complaint: Hospital follow up for bowel obstruction. Pt denies any GI complaints   History of Present Illness:   70 year old Caucasian male with known asymptomatic gallstones who was recently hospitalized with small bowel obstruction with recent consultation excellent care by Dr. Ovidio Kin on August 28. Patient is up-to-date on endoscopy and colonoscopy exams. He has had previous sigmoid resection for colon cancer.[name] Kitchen He also has had several repairs of ventral hernias and has a mesh in place.  His mid abdominal pain resolved with 24 hours of bowel decompression. He currently is asymptomatic and is having regular bowel movements and no abdominal pain. He is back on daily omeprazole and multiple other medications per Dr. Artist Pais, his appetite is good and his weight is stable. He denies any hepatobiliary or lower GI complaints this time. He has been using more fluids and fiber in his diet.   GI Review of Systems      Denies abdominal pain, acid reflux, belching, bloating, chest pain, dysphagia with liquids, dysphagia with solids, heartburn, loss of appetite, nausea, vomiting, vomiting blood, weight loss, and  weight gain.        Denies anal fissure, black tarry stools, change in bowel habit, constipation, diarrhea, diverticulosis, fecal incontinence, heme positive stool, hemorrhoids, irritable bowel syndrome, jaundice, light color stool, liver problems, rectal bleeding, and  rectal pain.    Current Medications (verified): 1)  Omeprazole 20 Mg  Cpdr (Omeprazole) .... One Cap By Mouth Once Daily Ac 2)  Diovan 320 Mg Tabs (Valsartan) .... One By Mouth Qd 3)  Amlodipine Besylate 10 Mg  Tabs (Amlodipine Besylate) .... Take 1 Tablet By Mouth Once A Day 4)  Allopurinol 100 Mg  Tabs (Allopurinol) ....  Two By Mouth Two Times A Day 5)  Simvastatin 10 Mg Tabs (Simvastatin) .... One By Mouth Once Daily 6)  Metformin Hcl 500 Mg Tabs (Metformin Hcl) .... One By Mouth Bid 7)  Afrin Nasal Spray 0.05 % Soln (Oxymetazoline Hcl) .[name] Kitchen.. 1 Spray Each Nostril At Bedtime. 8)  Aspir-Low 81 Mg Tbec (Aspirin) .... Take 1 Tablet By Mouth Once A Day 9)  Potassium 99 Mg Tabs (Potassium) .... Take 1 Tablet By Mouth Once A Day 10)  Ergocalciferol 50000 Unit Caps (Ergocalciferol) .... One Cap By Mouth Once Weekly 11)  Diazepam 2 Mg Tabs (Diazepam) .... One By Mouth At Bedtime As Needed For Insomnia  Allergies (verified): 1)  ! * Ivp Dye  Past History:  Past medical, surgical, family and social histories (including risk factors) reviewed for relevance to current acute and chronic problems.  Past Medical History: HYPERGLYCEMIA (ICD-790.29) UNSPECIFIED VITAMIN D DEFICIENCY (ICD-268.9) LEG CRAMPS (ICD-729.82) ABDOMINAL PAIN (ICD-789.00) CHEST PAIN (ICD-786.50) CALCANEAL SPUR (ICD-726.73) VENTRAL HERNIA (ICD-553.20) COUGH (ICD-786.2) URI (ICD-465.9) ACHILLES TENDINITIS, BILATERAL (ICD-726.71) HYPERLIPIDEMIA (ICD-272.4) EXTERNAL HEMORRHOIDS (ICD-455.3) OBESITY (ICD-278.00) DIVERTICULOSIS, COLON (ICD-562.10) HIATAL HERNIA (ICD-553.3) INSOMNIA UNSPECIFIED (ICD-780.52) ABDOMINAL PAIN, CHRONIC (ICD-789.00) PROSTATE SPECIFIC ANTIGEN, ELEVATED (ICD-790.93) GOUT NOS (ICD-274.9) SLEEP APNEA, OBSTRUCTIVE (ICD-327.23) HYPERTENSION (ICD-401.9) GERD (ICD-530.81) COLON CANCER, HX OF (ICD-V10.05) COLONIC POLYPS, HX OF (ICD-V12.72)  OSA  Lumbar spondylosis Multiple incisional hernia repairs  (mesh) Hx of post prandial nausea  - ? related to gallbladder sludge and gallstones 04/08  Past Surgical History: Colon Resection with mesh repair 1986 Vertical banding gastroplasty 1987   Cataract extraction   Knee  Surgery        Hernia Surgery Back Surgery  Family History: Reviewed history from 01/19/2010 and no  changes required. Father died from perforated esphagus, history of CVA. Mother had breast cancer and hypertension.     No FH of Colon Cancer:  Social History: Reviewed history from 01/19/2010 and no changes required. Occupation: Semi Retired Married Former Smoker  Alcohol use-yes 1-2 three times a week Daily Caffeine Use: 2 cups daily  Illicit Drug Use - no Drug Use:  no  Review of Systems       The patient complains of allergy/sinus, arthritis/joint pain, back pain, change in vision, hearing problems, muscle pains/cramps, sleeping problems, sore throat, and swelling of feet/legs.  The patient denies anemia, anxiety-new, blood in urine, breast changes/lumps, confusion, cough, coughing up blood, depression-new, fainting, fatigue, fever, headaches-new, heart murmur, heart rhythm changes, itching, night sweats, nosebleeds, shortness of breath, skin rash, swollen lymph glands, thirst - excessive, urination - excessive, urination changes/pain, urine leakage, vision changes, and voice change.    Vital Signs:  Patient profile:   70 year old male Height:      69.5 inches Weight:      273 pounds BMI:     39.88 BSA:     2.37 Pulse rate:   60 / minute Pulse rhythm:   regular BP sitting:   128 / 76  (left arm) Cuff size:   regular  Vitals Entered By: Ok Anis CMA (March 12, 2010 9:59 AM)  Physical Exam  General:  Well developed, well nourished, no acute distress.obese.   Head:  Normocephalic and atraumatic. Eyes:  PERRLA, no icterus. Lungs:  Clear throughout to auscultation. Heart:  Regular rate and rhythm; no murmurs, rubs,  or bruits. Abdomen:  Large well-healed midline upper abdominal scar without any evidence of herniation, masses or tenderness. Bowel sounds are normal. Neurologic:  Alert and  oriented x4;  grossly normal neurologically. Psych:  Alert and cooperative. Normal mood and affect.   Impression & Recommendations:  Problem # 1:  SMALL BOWEL OBSTRUCTION  (ICD-560.9) Assessment Improved Cautious observation recommended. He is to continue a high fiber diet n.p.o. fiber supplements with liberal p.o. fluids. He is up-to-date on his endoscopy and colonoscopy exams. We had a long discussion concerning also has diagnosis of gallbladder disease with possible need for cholecystectomy in the future. He denies hepatobiliary complaints at this time. He is to continue his daily omeprazole for his acid reflux.  Problem # 2:  ABDOMINAL PAIN (ICD-789.00) Assessment: Improved  Problem # 3:  DIVERTICULOSIS, COLON (ICD-562.10) Assessment: Unchanged  Problem # 4:  COLON CANCER, HX OF (ICD-V10.05) Assessment: Unchanged  Patient Instructions: 1)  Copy sent to : D. Thomos Lemons DO 2)  Please continue current medications.  3)  Please schedule a follow-up appointment as needed.  4)  The medication list was reviewed and reconciled.  All changed / newly prescribed medications were explained.  A complete medication list was provided to the patient / caregiver. 5)  Diet should be high in fiber ( fruits, vegetables, whole grains) but low in residue. Drink at least eight (8) glasses of water a day.

## 2010-08-04 NOTE — Assessment & Plan Note (Signed)
Summary: np6/chest pains      Allergies Added:   Visit Type:  Initial Consult Referring Hasson Gaspard:  na Primary Taylin Mans:  Dondra Spry DO  CC:  chest discomfort.  History of Present Illness: [name] is 70 years old and is referred for evaluation of chest pain and shortness of breath.  He has a long history of atypical chest pain which has been evaluated in [place] state before he moved here 5 years ago. He had cardiac catheterization in which he said was normal.  Over the past 5 days he has had recurrence of similar pain which he described as a left sided discomfort. He also has had some radiation down his left arm. He also feels somewhat short of breath with these symptoms. He's had a cough as well. None of these symptoms appear to be related to exertion.  His risk profile for vascular disease is fairly high with obesity hypertension, hyperlipidemia, and diabetes.  Current Medications (verified): 1)  Diovan 320 Mg Tabs (Valsartan) .... One By Mouth Qd 2)  Amlodipine Besylate 10 Mg  Tabs (Amlodipine Besylate) .... Take 1 Tablet By Mouth Once A Day 3)  Allopurinol 100 Mg  Tabs (Allopurinol) .... Two By Mouth Two Times A Day 4)  Simvastatin 10 Mg Tabs (Simvastatin) .... One By Mouth Once Daily 5)  Metformin Hcl 500 Mg Tabs (Metformin Hcl) .... One By Mouth Bid 6)  Afrin Nasal Spray 0.05 % Soln (Oxymetazoline Hcl) .[name] Kitchen.. 1 Spray Each Nostril At Bedtime. 7)  Aspir-Low 81 Mg Tbec (Aspirin) .... Take 1 Tablet By Mouth Once A Day 8)  Potassium 99 Mg Tabs (Potassium) .... Take 1 Tablet By Mouth Once A Day  Allergies (verified): 1)  ! * Ivp Dye  Past History:  Past Medical History: Reviewed history from 03/12/2010 and no changes required. HYPERGLYCEMIA (ICD-790.29) UNSPECIFIED VITAMIN D DEFICIENCY (ICD-268.9) LEG CRAMPS (ICD-729.82) ABDOMINAL PAIN (ICD-789.00) CHEST PAIN (ICD-786.50) CALCANEAL SPUR (ICD-726.73) VENTRAL HERNIA (ICD-553.20) COUGH (ICD-786.2) URI  (ICD-465.9) ACHILLES TENDINITIS, BILATERAL (ICD-726.71) HYPERLIPIDEMIA (ICD-272.4) EXTERNAL HEMORRHOIDS (ICD-455.3) OBESITY (ICD-278.00) DIVERTICULOSIS, COLON (ICD-562.10) HIATAL HERNIA (ICD-553.3) INSOMNIA UNSPECIFIED (ICD-780.52) ABDOMINAL PAIN, CHRONIC (ICD-789.00) PROSTATE SPECIFIC ANTIGEN, ELEVATED (ICD-790.93) GOUT NOS (ICD-274.9) SLEEP APNEA, OBSTRUCTIVE (ICD-327.23) HYPERTENSION (ICD-401.9) GERD (ICD-530.81) COLON CANCER, HX OF (ICD-V10.05) COLONIC POLYPS, HX OF (ICD-V12.72)  OSA  Lumbar spondylosis Multiple incisional hernia repairs  (mesh) Hx of post prandial nausea  - ? related to gallbladder sludge and gallstones 04/08  Family History: Reviewed history from 03/12/2010 and no changes required. Father died from perforated esphagus, history of CVA. Mother had breast cancer and hypertension.     No FH of Colon Cancer:  Social History: Reviewed history from 03/12/2010 and no changes required. Occupation: Semi Retired Married Former Smoker  Alcohol use-yes 1-2 three times a week Daily Caffeine Use: 2 cups daily  Illicit Drug Use - no  Review of Systems       ROS is negative except as outlined in HPI.   Vital Signs:  Patient profile:   70 year old male Height:      69 inches Weight:      273 pounds BMI:     40.46 Pulse rate:   54 / minute Pulse rhythm:   regular Resp:     18 per minute BP sitting:   144 / 80  (left arm) Cuff size:   large  Vitals Entered By: Burnett Kanaris, CNA (April 21, 2010 2:10 PM)  Physical Exam  Additional Exam:  Gen. Well-nourished, in no  distress Head: Normocephalic    Eyes: PERRLA/EOM intact; conjunctiva and lids normal Neck: No JVD, thyroid not enlarged, no carotid bruits Lungs: No tachypnea, clear w/o rales, rhonchi or wheezes CV: Rhythm regular, PMI not displaced,  sounds  nl, no murmurs or gallops, no edema, pulses nl UE and LE Abd: BS nl, abd soft and non-tender w/o masses or hepatosplenomegaly MS: No  deformities Neuro: no focal sns Skin: no lesions Psych: nl affect    Impression & Recommendations:  Problem # 1:  CHEST PAIN (ICD-786.50)  The chest pain and shortness of breath are somewhat atypical for ischemia and not related to exertion. However he does have a high-risk profile for vascular disease and he and his wife are very concerned about his symptoms. For these reasons I think he should be evaluated further and we have arranged for him to have a 2-D echocardiogram and an exercise stress test Myoview scan. We'll plan to convert this to a Lexiscan if he does not achieve a good heart rate.  He had a negative d-dimer and a normal BM P. and BNP yesterday. he also had a normal ECG and a normal chest x-ray.  His updated medication list for this problem includes:    Amlodipine Besylate 10 Mg Tabs (Amlodipine besylate) .[name] Kitchen... Take 1 tablet by mouth once a day    Aspir-low 81 Mg Tbec (Aspirin) .[name] Kitchen... Take 1 tablet by mouth once a day  Problem # 2:  HYPERLIPIDEMIA (ICD-272.4) This is managed by his primary care physician with simvastatin. His updated medication list for this problem includes:    Simvastatin 10 Mg Tabs (Simvastatin) ..... One by mouth once daily  Problem # 3:  HYPERTENSION (ICD-401.9) This is reasonably well controlled on current medications. His updated medication list for this problem includes:    Diovan 320 Mg Tabs (Valsartan) ..... One by mouth qd    Amlodipine Besylate 10 Mg Tabs (Amlodipine besylate) .[name] Kitchen... Take 1 tablet by mouth once a day    Aspir-low 81 Mg Tbec (Aspirin) .[name] Kitchen... Take 1 tablet by mouth once a day  Other Orders: EKG w/ Interpretation (93000) Nuclear Stress Test (Nuc Stress Test) Echocardiogram (Echo)  Patient Instructions: 1)  Your physician has requested that you have an echocardiogram.  Echocardiography is a painless test that uses sound waves to create images of your heart. It provides your doctor with information about the size and shape of your  heart and how well your heart's chambers and valves are working.  This procedure takes approximately one hour. There are no restrictions for this procedure. 2)  Your physician has requested that you have an exercise stress myoview.  For further information please visit [email or web address]  Please follow instruction sheet, as given. 3)  Your physician recommends that you continue on your current medications as directed. Please refer to the Current Medication list given to you today. 4)  We will call you with the results of your tests. If they are normal, we will see you back on an as needed basis.

## 2010-08-04 NOTE — Progress Notes (Signed)
Summary: test results   Phone Note Call from Patient Call back at Work Phone [phone]   Caller: Patient Call For: Bindu Docter  Summary of Call: patient would like results of 2D echo and stress test  Initial call taken by: Roselle Locus,  April 29, 2010 8:09 AM  Follow-up for Phone Call        stress test was normal  2 D Echo - no sign of heart damage  I will review details of 2 D Echo at next OV Follow-up by: D. Thomos Lemons DO,  April 29, 2010 11:52 AM     Appended Document: test results  Pt notified and voices understanding.

## 2010-08-06 NOTE — Assessment & Plan Note (Signed)
Summary: 1 month follow up/mhf   Vital Signs:  Patient profile:   70 year old male Height:      69 inches Weight:      276.75 pounds BMI:     41.02 O2 Sat:      98 % on Room air Temp:     97.2 degrees F oral Pulse rate:   58 / minute Resp:     18 per minute BP sitting:   118 / 72  (right arm) Cuff size:   regular  Vitals Entered By: Glendell Docker CMA (July 16, 2010 8:04 AM)  O2 Flow:  Room air CC: follow-up visit Is Patient Diabetic? No Pain Assessment Patient in pain? no      Comments discuss wt loss options   Primary Care Provider:  Dondra Spry DO  CC:  follow-up visit.  History of Present Illness: 70 y/o male frustrated with obesity pt has difficulty with appetite control  exercising regularly  enjoyes 2-3 glasses of wine ,  and  desserts  pt inquires about use of phentermine  htn - stable  Preventive Screening-Counseling & Management  Alcohol-Tobacco     Smoking Status: quit  Allergies: 1)  ! * Ivp Dye  Past History:  Past Medical History: HYPERGLYCEMIA (ICD-790.29) UNSPECIFIED VITAMIN D DEFICIENCY (ICD-268.9) LEG CRAMPS (ICD-729.82) ABDOMINAL PAIN (ICD-789.00) CHEST PAIN (ICD-786.50)    CALCANEAL SPUR (ICD-726.73) VENTRAL HERNIA (ICD-553.20) COUGH (ICD-786.2) URI (ICD-465.9) ACHILLES TENDINITIS, BILATERAL (ICD-726.71) HYPERLIPIDEMIA (ICD-272.4) EXTERNAL HEMORRHOIDS (ICD-455.3) OBESITY (ICD-278.00) DIVERTICULOSIS, COLON (ICD-562.10) HIATAL HERNIA (ICD-553.3) INSOMNIA UNSPECIFIED (ICD-780.52) ABDOMINAL PAIN, CHRONIC (ICD-789.00) PROSTATE SPECIFIC ANTIGEN, ELEVATED (ICD-790.93) GOUT NOS (ICD-274.9) SLEEP APNEA, OBSTRUCTIVE (ICD-327.23) HYPERTENSION (ICD-401.9) GERD (ICD-530.81) COLON CANCER, HX OF (ICD-V10.05) COLONIC POLYPS, HX OF (ICD-V12.72)  OSA  Lumbar spondylosis Multiple incisional hernia repairs  (mesh) Hx of post prandial nausea  - ? related to gallbladder sludge and gallstones 2022-10-27  Family History: Father died  from perforated esphagus, history of CVA. Mother had breast cancer and hypertension.     No FH of Colon Cancer:    mom is poor health - passing away ( she lives in [place], [place])  Physical Exam  General:  alert and overweight-appearing.   Neck:  supple, no masses, and no carotid bruits.   Lungs:  normal respiratory effort and normal breath sounds.   Heart:  normal rate, regular rhythm, and no gallop.   Extremities:  trace left pedal edema and trace right pedal edema.     Impression & Recommendations:  Problem # 1:  OBESITY (ICD-278.00) I advised against use phenterimine due to increased CV risk Pt counseled on diet and exercise.  Problem # 2:  HYPERTENSION (ICD-401.9) Assessment: Improved  His updated medication list for this problem includes:    Diovan 320 Mg Tabs (Valsartan) ..... One by mouth qd    Amlodipine Besylate 10 Mg Tabs (Amlodipine besylate) .[name] Kitchen... Take 1 tablet by mouth once a day  BP today: 118/72 Prior BP: 130/70 (05/27/2010)  Labs Reviewed: K+: 4.1 (04/20/2010) Creat: : 1.07 (04/20/2010)   Chol: 137 (01/19/2010)   HDL: 47 (01/19/2010)   LDL: 70 (01/19/2010)   TG: 102 (01/19/2010)  Complete Medication List: 1)  Diovan 320 Mg Tabs (Valsartan) .... One by mouth qd 2)  Amlodipine Besylate 10 Mg Tabs (Amlodipine besylate) .... Take 1 tablet by mouth once a day 3)  Allopurinol 100 Mg Tabs (Allopurinol) .... Take 1 tablet by mouth daily. 4)  Simvastatin 10 Mg Tabs (Simvastatin) .... One by mouth once daily  5)  Metformin Hcl 500 Mg Tabs (Metformin hcl) .... One by mouth bid 6)  Afrin Nasal Spray 0.05 % Soln (Oxymetazoline hcl) .[name] Kitchen.. 1 spray each nostril at bedtime. 7)  Aspir-low 81 Mg Tbec (Aspirin) .... Take 1 tablet by mouth once a day 8)  Potassium 99 Mg Tabs (Potassium) .... Take 1 tablet by mouth once a day 9)  Finasteride 5 Mg Tabs (Finasteride) .... One tab by mouth once daily   Orders Added: 1)  Est. Patient Level III [16109]     Current Allergies  (reviewed today): ! * IVP DYE

## 2010-08-06 NOTE — Letter (Signed)
Summary: CMN for CPAP/[facility]  CMN for CPAP/[facility]   Imported By: Lanelle Bal 06/20/2010 11:02:37  _____________________________________________________________________  External Attachment:    Type:   Image     Comment:   External Document

## 2010-08-19 ENCOUNTER — Encounter: Payer: Self-pay | Admitting: Internal Medicine

## 2010-09-01 NOTE — Letter (Signed)
Summary: [facility]  [facility]   Imported By: Maryln Gottron 08/27/2010 14:06:49  _____________________________________________________________________  External Attachment:    Type:   Image     Comment:   External Document

## 2010-09-18 LAB — DIFFERENTIAL
Basophils Absolute: 0 10*3/uL (ref 0.0–0.1)
Basophils Absolute: 0 10*3/uL (ref 0.0–0.1)
Basophils Relative: 0 % (ref 0–1)
Basophils Relative: 0 % (ref 0–1)
Eosinophils Absolute: 0.1 10*3/uL (ref 0.0–0.7)
Eosinophils Absolute: 0.1 10*3/uL (ref 0.0–0.7)
Eosinophils Relative: 1 % (ref 0–5)
Eosinophils Relative: 1 % (ref 0–5)
Lymphocytes Relative: 20 % (ref 12–46)
Lymphocytes Relative: 23 % (ref 12–46)
Lymphs Abs: 1.4 10*3/uL (ref 0.7–4.0)
Lymphs Abs: 1.4 10*3/uL (ref 0.7–4.0)
Monocytes Absolute: 0.4 10*3/uL (ref 0.1–1.0)
Monocytes Absolute: 0.6 10*3/uL (ref 0.1–1.0)
Monocytes Relative: 7 % (ref 3–12)
Monocytes Relative: 8 % (ref 3–12)
Neutro Abs: 4.2 10*3/uL (ref 1.7–7.7)
Neutro Abs: 5 10*3/uL (ref 1.7–7.7)
Neutrophils Relative %: 68 % (ref 43–77)
Neutrophils Relative %: 71 % (ref 43–77)

## 2010-09-18 LAB — URINALYSIS, ROUTINE W REFLEX MICROSCOPIC
Glucose, UA: NEGATIVE mg/dL
Hgb urine dipstick: NEGATIVE
Nitrite: NEGATIVE
Protein, ur: NEGATIVE mg/dL
Specific Gravity, Urine: 1.035 — ABNORMAL HIGH (ref 1.005–1.030)
Urobilinogen, UA: 1 mg/dL (ref 0.0–1.0)
pH: 5.5 (ref 5.0–8.0)

## 2010-09-18 LAB — CBC
HCT: 38.8 % — ABNORMAL LOW (ref 39.0–52.0)
HCT: 43 % (ref 39.0–52.0)
Hemoglobin: 13 g/dL (ref 13.0–17.0)
Hemoglobin: 14.6 g/dL (ref 13.0–17.0)
MCH: 29.6 pg (ref 26.0–34.0)
MCH: 29.9 pg (ref 26.0–34.0)
MCHC: 33.5 g/dL (ref 30.0–36.0)
MCHC: 33.9 g/dL (ref 30.0–36.0)
MCV: 88 fL (ref 78.0–100.0)
MCV: 88.2 fL (ref 78.0–100.0)
Platelets: 182 10*3/uL (ref 150–400)
Platelets: 216 10*3/uL (ref 150–400)
RBC: 4.4 MIL/uL (ref 4.22–5.81)
RBC: 4.89 MIL/uL (ref 4.22–5.81)
RDW: 16 % — ABNORMAL HIGH (ref 11.5–15.5)
RDW: 16.2 % — ABNORMAL HIGH (ref 11.5–15.5)
WBC: 6.1 10*3/uL (ref 4.0–10.5)
WBC: 7 10*3/uL (ref 4.0–10.5)

## 2010-09-18 LAB — BASIC METABOLIC PANEL
BUN: 11 mg/dL (ref 6–23)
BUN: 19 mg/dL (ref 6–23)
CO2: 25 mEq/L (ref 19–32)
CO2: 25 mEq/L (ref 19–32)
Calcium: 8.5 mg/dL (ref 8.4–10.5)
Calcium: 8.7 mg/dL (ref 8.4–10.5)
Chloride: 109 mEq/L (ref 96–112)
Chloride: 111 mEq/L (ref 96–112)
Creatinine, Ser: 1.09 mg/dL (ref 0.4–1.5)
Creatinine, Ser: 1.12 mg/dL (ref 0.4–1.5)
GFR calc Af Amer: >60 mL/min (ref >60)
GFR calc Af Amer: >60 mL/min (ref >60)
GFR calc non Af Amer: >60 mL/min (ref >60)
GFR calc non Af Amer: >60 mL/min (ref >60)
Glucose, Bld: 112 mg/dL — ABNORMAL HIGH (ref 70–99)
Glucose, Bld: 78 mg/dL (ref 70–99)
Potassium: 3.7 mEq/L (ref 3.5–5.1)
Potassium: 3.8 mEq/L (ref 3.5–5.1)
Sodium: 140 mEq/L (ref 135–145)
Sodium: 142 mEq/L (ref 135–145)

## 2010-09-18 LAB — HEMOGLOBIN A1C
Hgb A1c MFr Bld: 5.6 % (ref <5.7)
Mean Plasma Glucose: 114 mg/dL (ref <117)

## 2010-09-18 LAB — CK TOTAL AND CKMB (NOT AT ARMC)
CK, MB: 2.6 ng/mL (ref 0.3–4.0)
Relative Index: 2.2 (ref 0.0–2.5)
Total CK: 119 U/L (ref 7–232)

## 2010-09-18 LAB — HEPATIC FUNCTION PANEL
ALT: 29 U/L (ref 0–53)
AST: 24 U/L (ref 0–37)
Albumin: 3.7 g/dL (ref 3.5–5.2)
Alkaline Phosphatase: 57 U/L (ref 39–117)
Bilirubin, Direct: 0.1 mg/dL (ref 0.0–0.3)
Indirect Bilirubin: 0.5 mg/dL (ref 0.3–0.9)
Total Bilirubin: 0.6 mg/dL (ref 0.3–1.2)
Total Protein: 7 g/dL (ref 6.0–8.3)

## 2010-09-18 LAB — CEA
CEA: 1.1 ng/mL (ref 0.0–5.0)
CEA: 1.1 ng/mL (ref 0.0–5.0)

## 2010-09-18 LAB — CARDIAC PANEL(CRET KIN+CKTOT+MB+TROPI)
CK, MB: 2.4 ng/mL (ref 0.3–4.0)
CK, MB: 2.6 ng/mL (ref 0.3–4.0)
Relative Index: 2.4 (ref 0.0–2.5)
Relative Index: 2.4 (ref 0.0–2.5)
Total CK: 100 U/L (ref 7–232)
Total CK: 110 U/L (ref 7–232)
Troponin I: 0.01 ng/mL (ref 0.00–0.06)
Troponin I: 0.02 ng/mL (ref 0.00–0.06)

## 2010-09-18 LAB — TROPONIN I: Troponin I: <0.01 ng/mL (ref 0.00–0.06)

## 2010-09-18 LAB — AFP TUMOR MARKER: AFP-Tumor Marker: 1.4 ng/mL (ref 0.0–8.0)

## 2010-09-18 LAB — LIPASE, BLOOD: Lipase: 25 U/L (ref 11–59)

## 2010-09-23 LAB — GLUCOSE, CAPILLARY
Glucose-Capillary: 100 mg/dL — ABNORMAL HIGH (ref 70–99)
Glucose-Capillary: 91 mg/dL (ref 70–99)

## 2010-09-27 LAB — CBC
HCT: 42.5 % (ref 39.0–52.0)
Hemoglobin: 13.8 g/dL (ref 13.0–17.0)
MCHC: 32.5 g/dL (ref 30.0–36.0)
MCV: 88.7 fL (ref 78.0–100.0)
Platelets: 226 10*3/uL (ref 150–400)
RBC: 4.8 MIL/uL (ref 4.22–5.81)
RDW: 15.4 % (ref 11.5–15.5)
WBC: 7.5 10*3/uL (ref 4.0–10.5)

## 2010-09-27 LAB — URINALYSIS, ROUTINE W REFLEX MICROSCOPIC
Bilirubin Urine: NEGATIVE
Glucose, UA: NEGATIVE mg/dL
Hgb urine dipstick: NEGATIVE
Ketones, ur: NEGATIVE mg/dL
Nitrite: NEGATIVE
Protein, ur: NEGATIVE mg/dL
Specific Gravity, Urine: 1.023 (ref 1.005–1.030)
Urobilinogen, UA: 0.2 mg/dL (ref 0.0–1.0)
pH: 6 (ref 5.0–8.0)

## 2010-09-27 LAB — LIPASE, BLOOD: Lipase: 26 U/L (ref 11–59)

## 2010-09-27 LAB — COMPREHENSIVE METABOLIC PANEL
ALT: 22 U/L (ref 0–53)
AST: 23 U/L (ref 0–37)
Albumin: 3.9 g/dL (ref 3.5–5.2)
Alkaline Phosphatase: 63 U/L (ref 39–117)
BUN: 18 mg/dL (ref 6–23)
CO2: 27 mEq/L (ref 19–32)
Calcium: 9.3 mg/dL (ref 8.4–10.5)
Chloride: 111 mEq/L (ref 96–112)
Creatinine, Ser: 1.21 mg/dL (ref 0.4–1.5)
GFR calc Af Amer: >60 mL/min (ref >60)
GFR calc non Af Amer: 59 mL/min — ABNORMAL LOW (ref >60)
Glucose, Bld: 110 mg/dL — ABNORMAL HIGH (ref 70–99)
Potassium: 4.1 mEq/L (ref 3.5–5.1)
Sodium: 141 mEq/L (ref 135–145)
Total Bilirubin: 0.7 mg/dL (ref 0.3–1.2)
Total Protein: 7.2 g/dL (ref 6.0–8.3)

## 2010-09-27 LAB — DIFFERENTIAL
Basophils Absolute: 0 10*3/uL (ref 0.0–0.1)
Basophils Relative: 1 % (ref 0–1)
Eosinophils Absolute: 0.1 10*3/uL (ref 0.0–0.7)
Eosinophils Relative: 2 % (ref 0–5)
Lymphocytes Relative: 22 % (ref 12–46)
Lymphs Abs: 1.6 10*3/uL (ref 0.7–4.0)
Monocytes Absolute: 0.5 10*3/uL (ref 0.1–1.0)
Monocytes Relative: 7 % (ref 3–12)
Neutro Abs: 5.1 10*3/uL (ref 1.7–7.7)
Neutrophils Relative %: 69 % (ref 43–77)

## 2010-10-05 LAB — BASIC METABOLIC PANEL
BUN: 15 mg/dL (ref 6–23)
CO2: 25 mEq/L (ref 19–32)
Calcium: 9.2 mg/dL (ref 8.4–10.5)
Chloride: 106 mEq/L (ref 96–112)
Creatinine, Ser: 1 mg/dL (ref 0.4–1.5)
GFR calc Af Amer: >60 mL/min (ref >60)
GFR calc non Af Amer: >60 mL/min (ref >60)
Glucose, Bld: 91 mg/dL (ref 70–99)
Potassium: 4.2 mEq/L (ref 3.5–5.1)
Sodium: 142 mEq/L (ref 135–145)

## 2010-10-05 LAB — HEMOGLOBIN A1C
Hgb A1c MFr Bld: 6 % (ref 4.6–6.1)
Mean Plasma Glucose: 126 mg/dL

## 2010-10-05 LAB — HEPATIC FUNCTION PANEL
ALT: 20 U/L (ref 0–53)
AST: 19 U/L (ref 0–37)
Albumin: 3.4 g/dL — ABNORMAL LOW (ref 3.5–5.2)
Alkaline Phosphatase: 58 U/L (ref 39–117)
Bilirubin, Direct: 0.2 mg/dL (ref 0.0–0.3)
Indirect Bilirubin: 0.2 mg/dL — ABNORMAL LOW (ref 0.3–0.9)
Total Bilirubin: 0.4 mg/dL (ref 0.3–1.2)
Total Protein: 6.2 g/dL (ref 6.0–8.3)

## 2010-10-05 LAB — CARDIAC PANEL(CRET KIN+CKTOT+MB+TROPI)
CK, MB: 2.8 ng/mL (ref 0.3–4.0)
CK, MB: 3 ng/mL (ref 0.3–4.0)
Relative Index: 1.9 (ref 0.0–2.5)
Relative Index: 2 (ref 0.0–2.5)
Total CK: 141 U/L (ref 7–232)
Total CK: 160 U/L (ref 7–232)
Troponin I: 0.01 ng/mL (ref 0.00–0.06)
Troponin I: 0.03 ng/mL (ref 0.00–0.06)

## 2010-10-05 LAB — CBC
HCT: 40.8 % (ref 39.0–52.0)
Hemoglobin: 13.9 g/dL (ref 13.0–17.0)
MCHC: 34 g/dL (ref 30.0–36.0)
MCV: 87.4 fL (ref 78.0–100.0)
Platelets: 199 10*3/uL (ref 150–400)
RBC: 4.67 MIL/uL (ref 4.22–5.81)
RDW: 14.4 % (ref 11.5–15.5)
WBC: 5.6 10*3/uL (ref 4.0–10.5)

## 2010-10-05 LAB — DIFFERENTIAL
Basophils Absolute: 0 10*3/uL (ref 0.0–0.1)
Basophils Relative: 1 % (ref 0–1)
Eosinophils Absolute: 0.2 10*3/uL (ref 0.0–0.7)
Eosinophils Relative: 3 % (ref 0–5)
Lymphocytes Relative: 26 % (ref 12–46)
Lymphs Abs: 1.5 10*3/uL (ref 0.7–4.0)
Monocytes Absolute: 0.4 10*3/uL (ref 0.1–1.0)
Monocytes Relative: 8 % (ref 3–12)
Neutro Abs: 3.5 10*3/uL (ref 1.7–7.7)
Neutrophils Relative %: 62 % (ref 43–77)

## 2010-10-05 LAB — POCT CARDIAC MARKERS
CKMB, poc: 2.3 ng/mL (ref 1.0–8.0)
Myoglobin, poc: 106 ng/mL (ref 12–200)
Troponin i, poc: <0.05 ng/mL (ref 0.00–0.09)

## 2010-10-05 LAB — LIPID PANEL
Cholesterol: 119 mg/dL (ref 0–200)
HDL: 34 mg/dL — ABNORMAL LOW (ref >39)
LDL Cholesterol: 64 mg/dL (ref 0–99)
Total CHOL/HDL Ratio: 3.5 RATIO
Triglycerides: 107 mg/dL (ref <150)
VLDL: 21 mg/dL (ref 0–40)

## 2010-10-05 LAB — GLUCOSE, CAPILLARY
Glucose-Capillary: 103 mg/dL — ABNORMAL HIGH (ref 70–99)
Glucose-Capillary: 90 mg/dL (ref 70–99)

## 2010-10-05 LAB — LIPASE, BLOOD: Lipase: 19 U/L (ref 11–59)

## 2010-10-05 LAB — D-DIMER, QUANTITATIVE (NOT AT ARMC): D-Dimer, Quant: 0.3 ug/mL-FEU (ref 0.00–0.48)

## 2010-10-20 LAB — GLUCOSE, CAPILLARY
Glucose-Capillary: 104 mg/dL — ABNORMAL HIGH (ref 70–99)
Glucose-Capillary: 30 mg/dL — CL (ref 70–99)
Glucose-Capillary: 89 mg/dL (ref 70–99)

## 2010-10-21 ENCOUNTER — Other Ambulatory Visit: Payer: Self-pay | Admitting: Internal Medicine

## 2010-11-17 ENCOUNTER — Encounter: Payer: Self-pay | Admitting: Internal Medicine

## 2010-11-17 ENCOUNTER — Other Ambulatory Visit: Payer: Self-pay | Admitting: *Deleted

## 2010-11-17 NOTE — Telephone Encounter (Signed)
Patient called and left voice message requesting a Rx refill for Omeprazole. His message states that he does not remember which doctor had him stop taking it, but he would like to know if Dr Artist Pais would authorize a new Rx. He states that he is having a hard time with acid reflux

## 2010-11-17 NOTE — Op Note (Signed)
NAMEKELECHI, ORGERON               ACCOUNT NO.:  [id number]   MEDICAL RECORD NO.:  [id number]          PATIENT TYPE:  AMB   LOCATION:  [facility]                         FACILITY:  [facility]   PHYSICIAN:  Erasmo Leventhal, M.D.DATE OF BIRTH:  1941-02-14   DATE OF PROCEDURE:  05/23/2007  DATE OF DISCHARGE:                               OPERATIVE REPORT   PREOPERATIVE DIAGNOSIS:  Right shoulder labral tear, biceps tendon tear,  impingement syndrome, possible cuff tear, acromioclavicular arthritis.   POSTOPERATIVE DIAGNOSIS:  Right shoulder degenerative tearing glenoid  labrum, extensive partial tear of biceps tendon, impingement syndrome,  acromioclavicular arthritis.   PROCEDURE:  Right shoulder omentum arthroscopy then taken to debridement  of labral tears.  Biceps tenotomy.  Arthroscopic subacromial  decompression, acromioplasty, bursectomy, coracoacromial ligament  release, arthroscopic distal clavicle resection, Mumford procedure.   SURGEON:  Erasmo Leventhal, M.D.   ASSISTANT:  Jaquelyn Bitter. Chabon, P.A.-C.   ANESTHESIA:  Interscalene block, general.   BLOOD LOSS:  Less than 10 mL.   DRAINS:  None.   COMPLICATIONS:  None.   DISPOSITION:  PACU stable.   OPERATIVE DETAILS:  The patient and family were counseled in the holding  area.  The correct side was identified and signed appropriately.  An IV  started, block was administered.  He was taken to the operating room, IV  antibiotics were given.  Placed under general anesthesia, turned to a  left lateral decubitus position, properly padded and bumped.  The right  shoulder was then examined, full range of motion, stable.  It was  prepped with DuraPrep and draped in a sterile fashion, 30 degrees  abduction, 10 degrees forward flexion, 15 pounds longitudinal traction  due to the weight of his arm.  A posterior portal was created.  The  arthroscope was placed in the glenohumeral joint. Diagnostic arthroscopy  revealed  extensive degenerative tearing of the superior, anterior, and  posterior labrum.  There was no evidence of instability. Mild  chondromalacia of the glenoid and humeral head.  The rotator cuff  appeared to be intact.  Extensive cartilage tear in the biceps tendon.  An anterior portal was made with the outside in technique towards the  rotator cuff interval. Biceps tenotomy was performed with baskets and  shavers, as discussed preoperatively.  The shaver was then utilized to  debride the labrum back to stable tissue, anterior, posterior, and  superiorly.  Irrigated and the arthroscopic equipment was then [employer].  The subacromial region revealed a thick subacromial bursitis and  subacromial bursectomy was performed.  He had a type 2 acromion.  The  ArthroCare System was utilized to release the periosteum of the CA  ligament.  A bur was then placed posteriorly and an anterior-inferior  acromioplasty was performed converting to a flat acromion morphology.  Rotator cusp were inspected on the bursal surface and found to be  intact.  The [facility] joint was found to be osteoarthritic with underlying  subclavicular spur.   The soft tissue was removed with the cautery system.  The bur was then  placed, lateral 5-8 mm of clavicle was removed  circumferentially.  Then,  the superior and posterior acromioclavicular ligaments and capsule were  intact.  The clavicle was palpated and found to be stable.  Arthroscopic  debris was removed.  Hemostasis was obtained.  No other abnormalities  were noted.  He was taken out of traction.  He had normal pulses in the  hand at the end of the case.  Another 20 mL of 0.5% Marcaine with  epinephrine was placed in the portal sites subacromial region.  Sterile  dressings applied to the shoulder.  The portals were closed with 4-0  suture.  A sterile dressing was applied, he was placed in a sling,  turned supine, awakened, and taken to the operating room tagged in  stable  condition.  The patient tolerated the procedure well, there were  no complications or problems.  Sponge and needle counts were correct.  To help with surgical decision making and technique, Mr. Leilani Able,  P.A.-C., assisted throughout the entire case.           ______________________________  Erasmo Leventhal, M.D.     RAC/MEDQ  D:  05/23/2007  T:  05/23/2007  Job:  [id number]

## 2010-11-17 NOTE — Assessment & Plan Note (Signed)
[facility]                         GASTROENTEROLOGY OFFICE NOTE   RYSON, BACHA                      MRN:          [id number]  DATE:01/31/2007                            DOB:          1941/03/22    Mr. Haliburton is a 70 year old white male who I followed for many years for  previous colon carcinoma with resection in 1986.  He has known  gallstones which are questionably symptomatic.  His real problem is one  of intermittent, what sounds like, rather severe dysphagia with nausea  and vomiting approximately once a week.  He has had a previous banded  gastropexy for weight loss.  His last endoscopy in April 2008 showed  banding of his upper fundal area with some foreign material in his  stomach that was removed.  There was no evidence of esophageal  stricture.   MEDICATIONS:  He is on multiple medications including.  1. Daily Nexium.  2. He is a diabetic and is on metformin 1000 mg a day.  3. Norvasc 5 mg a day for hypertension.  4. Triamterene/hydrochlorothiazide.   Review of his chart shows that he has had recent MRI of his spine in  February which showed multiple areas of spondylosis.  He also had CT  scan of the abdomen in January 2007, which is fairly unremarkable except  for possible small gallstones.   PHYSICAL EXAMINATION:  Weight 260 pounds which is up some 5 pounds from  April.  Blood pressure 104/60, pulse 84 and regular.  General physical  examination was not repeated.   ASSESSMENT:  Certainly sounds like Mr. Ryker is having intermittent  gastric outlet obstruction perhaps related to his previous surgery  versus perhaps a motility disorder of his esophagus.  I would agree that  it certainly does not sound like he is having symptomatic cholelithiasis  at this time.   RECOMMENDATIONS:  I have gone ahead and set Mr. Fomby up for barium  swallow-upper GI series to see if we can get a handle on his problem.  May need also gastric  emptying scan.  In the interim I have asked him to  continue all of his medications until this study has been completed.     Vania Rea. Jarold Motto, MD, Caleen Essex, FAGA  Electronically Signed    DRP/MedQ  DD: 01/31/2007  DT: 02/01/2007  Job #: [id number]   cc:   Barbette Hair. Artist Pais, DO  Wilmon Arms. Tsuei, M.D.

## 2010-11-17 NOTE — Assessment & Plan Note (Signed)
[facility]                         GASTROENTEROLOGY OFFICE NOTE   Marcus [name], Marcus [name]                      MRN:          [id number]  DATE:04/10/2007                            DOB:          1940-09-01    MANOMETERY STUDY:   Marcus [name] has vague upper GI complaints and has known gallstones.  He  is being treated for acid reflux disease, as part of his workup has had  a recent normal gastric emptying scan.  Upper GI series on February 06, 2007, suggested esophageal motility disorder and manometry was completed  without difficulty on April 03, 2007.  Results are as follows:  1. Upper esophageal sphincter - There is normal coordination between      pharyngeal contraction and cricopharyngeal relaxation.  2. Mean lower esophageal sphincter pressure is normal at 25 mmHg with      normal relaxation and swallowing.  3. Motility pattern - There is normally propagated peristaltic waves      throughout the length of the esophagus with mean amplitude duration      of 193 mmHg.   ASSESSMENT:  This is a normal esophageal manometry without any evidence  of lower esophageal sphincter incompetency for an esophageal motility  disorder.   RECOMMENDATIONS:  This patient has been treated extensively medically  for acid reflux and delayed gastric emptying without improvement in his  symptomatology despite vigorous PPI therapy.  He is status post  gastroplasty, ventral hernia repair but has not had cholecystectomy.   We will refer him back to Dr. Corliss Skains for his opinion concerning this  somewhat difficult case.  As mentioned above endoscopy, colonoscopy, and  other diagnostic procedures including CT scans have been unremarkable  except for multiple small gallstones.     Vania Rea. Jarold Motto, MD, Caleen Essex, FAGA  Electronically Signed    DRP/MedQ  DD: 04/10/2007  DT: 04/10/2007  Job #: [id number]   cc:   Wilmon Arms. Tsuei, M.D.  Barbette Hair. Artist Pais, DO

## 2010-11-18 MED ORDER — OMEPRAZOLE 40 MG PO CPDR: DELAYED_RELEASE_CAPSULE | ORAL | Status: DC

## 2010-11-18 NOTE — Telephone Encounter (Signed)
Ok to refill omeprazole 40 mg   # 90 one po qd AC AM meal Refill x 3

## 2010-11-18 NOTE — Telephone Encounter (Signed)
Call was returned to patient at [phone], he was informed Rx has been sent

## 2010-11-20 NOTE — Assessment & Plan Note (Signed)
HEALTHCARE                         GASTROENTEROLOGY OFFICE NOTE   Marcus [name], Marcus [name]                      MRN:          [id number]  DATE:10/25/2006                            DOB:          03/16/1941    Marcus [name] is a 30-ish-year-old male that I have followed for many years  because of a previous history of colorectal cancer with resection in  1986 with recurrent polyps on colonoscopy exams, last done on July 07, 2005.  Marcus [name] also has sigmoid colon diverticulosis.  Marcus [name] is referred today  by Marcus [name] for evaluation of recent onset of crampy upper abdominal  pain.   HISTORY OF PRESENT ILLNESS:  For the last several years Marcus [name] has  had a couple of episodes of rather sudden onset of severe, sharp  epigastric pain without radiation.  This pain is associated with nausea  and vomiting and lasts several hours in duration.  Marcus [name] recently was seen  at [facility] emergency room and underwent evaluation with what appears  to be a normal physical examination and laboratory functions.  Marcus [name]  underwent CT scan of the abdomen on April 14 that showed no acute  findings but did show a fatty liver with a small hepatic cyst, small  stones and sludge in the gallbladder, previous episode of gastric  stapling procedure, and evidence of previous abdominal surgery.  The  patient has had multiple abdominal hernias after his gastric stapling  done some 20 years ago and currently has a mesh in his abdominal wall.  The patient was discharged from the emergency room with follow-up per  Marcus [name] and was subsequently referred to me.  Marcus [name] does suffer from  chronic sleep apnea, hypertension, and morbid obesity.   Since his emergency room visit the patient has had no further epigastric  pain or nausea and vomiting.  Marcus [name] does describe early satiety and  occasional dysphagia related to his stomach stapling procedure.  Marcus [name] has  not had the anticipated weight loss Marcus [name] expected from  his procedure that  was done in [place] some 20 years ago.  I cannot see that Marcus [name] has had  recent endoscopic exam or upper GI series.  Marcus [name] recently has new-onset  adult diabetes and is on metformin.  Marcus [name] also suffers from severe  lumbosacral spondylosis with radiculopathies in his back, followed by  Marcus [name], M.D.  Marcus [name] is followed by Marcus [name] for his sleep apnea  and is on a CPAP machine.  The patient also has a long history of  smoking and recurrent bronchitis.  Marcus [name] has had previous hemorrhoid  surgery.   The patient denies current abuse of alcohol or NSAIDs.  His bowels are  moving well and Marcus [name] denies melena or hematochezia, anorexia or weight  loss.  Marcus [name] denies any history of alcohol abuse or a history of hepatitis  or pancreatitis.   MEDICATIONS:  1. Nexium 40 mg a day, which Marcus [name] has been on for several years.  2. Benicar 20 mg a day.  3. Metformin 1 g a day.  4. Norvasc 5 mg a day.  5. Prednisone p.r.n. for his pulmonary problems.  6. Also takes a daily stool softener.   In the past apparently has had a reaction to IV dye.   PHYSICAL EXAMINATION:  GENERAL:  Examination shows him to be a healthy-  appearing white male in no acute distress, appearing his stated age.  VITAL SIGNS:  Marcus [name] weighs 255 pounds.  Blood pressure 132/84.  Pulse was  68 and regular.  SKIN:  I could not appreciate stigmata of chronic liver disease.  CHEST:  Clear.  CARDIAC:  Marcus [name] was in a regular rhythm without significant murmur, gallops  or rubs.  ABDOMEN:  A midline abdominal scar from the tip of the sternum all the  way to his suprapubic area.  There was no definite organomegaly but  there was a nodular mass in the midline above the umbilicus measuring  approximately 3-4 cm.  It was slightly movable and nontender.  His  abdominal exam otherwise was unremarkable and bowel sounds were not  obstructive.  EXTREMITIES:  The upper extremities were unremarkable.  NEUROLOGIC:  Mental status was clear.   RECTAL:  Deferred.   ASSESSMENT:  1. I think Marcus [name] most likely is having symptomatic      cholelithiasis.  Marcus [name] has had gallstones noted for several years on      review of his previous CT scan of the abdomen done in January 2007      because of an indeterminate lesion seen on chest CT scan.      Complicating his clinical evaluation, of course, is the presence of      gastric stapling, early satiety, reflux symptoms, and the presence      of abdominal wall mesh.  2. History of colon carcinoma with resection of recurrent colon      polyps.  3. Hypertensive cardiovascular disease.  4. New-onset adult diabetes mellitus.  5. Obesity and sleep apnea with history of chronic obstructive lung      disease.  6. Severe spondylosis with radiculopathy related to his spinal      disease, followed by neurosurgery.   RECOMMENDATIONS:  1. Outpatient endoscopy and upper GI evaluation.  2. Continue medications listed above.  3. The patient will probably need referral for cholecystectomy, which      I doubt can be done laparoscopically but this will be per Dr.      Fatima Sanger direction.  4. Other medications as per his multiple physicians.     Vania Rea. Jarold Motto, MD, Caleen Essex, FAGA  Electronically Signed    DRP/MedQ  DD: 10/25/2006  DT: 10/25/2006  Job #: [id number]   cc:   Wilmon Arms. Tsuei, M.D.  Barbette Hair. Artist Pais, DO  Barbaraann Share, MD,FCCP

## 2010-11-20 NOTE — Op Note (Signed)
NAMEAMARIYON, Marcus [name]               ACCOUNT NO.:  [id number]   MEDICAL RECORD NO.:  [id number]          PATIENT TYPE:  INP   LOCATION:  2899                         FACILITY:  [facility]   PHYSICIAN:  Kathaleen Maser. Pool, M.D.    DATE OF BIRTH:  [birth year]   DATE OF PROCEDURE:  09/19/2006  DATE OF DISCHARGE:                               OPERATIVE REPORT   PREOPERATIVE DIAGNOSIS:  Right L3-4 herniated pulposus with  radiculopathy.   POSTOPERATIVE DIAGNOSIS:  Right L3-4 herniated pulposus with  radiculopathy.   PROCEDURE NOTE:  Right L3-4 decompressive laminotomy with foraminotomy  and microdiskectomy.   SURGEON:  Kathaleen Maser. Pool, M.D.   ASSISTANT:  Donalee Citrin, M.D.   ANESTHESIA:  General endotracheal.   PREMEDICATION:  Mr. Siever is a 70 year old male, history of severe  right lower extremity pain consistent with a right-sided L4  radiculopathy failing conservative management.  Workup demonstrates  evidence of a right-sided L3-4 foldable disk herniation with coexistent  stenosis causing marked compression of the right-sided L4 nerve root.  The patient has been counseled as to his options.  He has decided  proceed with a right-sided L3-4 laminotomy and microdiskectomy in hopes  of improving symptoms.   OPERATIVE NOTE:  The patient was taken to the operating room placed on  table in supine position.  After anesthesia was achieved, the patient  prone onto Wilson frame, appropriately padded.  The patient's lumbar  region prepped and draped sterilely.  A 10 blade used to make linear  incision overlying L3-4 interspace.  This carried sharply in midline.  In fact the L4-5 level was identified by localizing x-ray.  Dissection  was then redirected one level up. The deep self-retaining retaining  retractor was placed.  Laminotomy then performed at L3-4 using high-  speed drill and Kerrison rongeurs to remove the inferior aspect of  lamina of L3, medial aspect of  L3-4 facet joint and the superior  rim of  the L4 lamina.  Ligament flavum was elevated resected piecemeal fashion  using Kerrison rongeurs.  Underlying thecal sac and exiting L4 nerve  root were identified.  Microscope brought to field for microdissection  of the right side L4 nerve root underlying disk herniation.  Epidural  venous plexus coagulated and cut.  Thecal sac and L4 nerve root gently  mobilized retracted towards midline.  Disk herniation was readily  apparent but this was then incised with 15 blade in rectangular fashion.  A wide disk space cleanout was then achieved using pituitary rongeurs,  upward angled pituitary rongeurs and Epstein curettes.  All elements the  disk herniation completely resected.  Disk space itself was very  collapsed and difficult to enter.  There is no evidence of any residual  compression remaining.  The wound was inspected.  The thecal sac and  nerve root were free from compression and injury.  Wound was then  irrigated with antibiotic solution.  Gelfoam was placed topically for  hemostasis found be good.  Microscope and retractor system were removed.  Hemostasis in muscles achieved  electrocautery.  Wound closed in layers with Vicryl suture.  Steri-  Strips sterile dressing were applied.  There were no apparent  complications.  The patient tolerated procedure well and he returns to  recovery postop in good condition.           ______________________________  Kathaleen Maser Pool, M.D.     HAP/MEDQ  D:  09/19/2006  T:  09/20/2006  Job:  [id number]

## 2010-11-20 NOTE — Assessment & Plan Note (Signed)
[facility]                                 ON-CALL NOTE   [name], [name]                        MRN:          [id number]  DATE:10/16/2006                            DOB:          [birth year]    REFERRING PHYSICIAN:  Barbette Hair. Artist Pais, DO   Marcus [name], a patient of Dr. Artist Pais, calling about Marcus [name].   Wife calling because the patient has had a history of bowel surgery and  now is vomiting which will not abate with Phenergan.  Advised to take to  the closest emergency room for evaluation.     Jeffrey A. Tawanna Cooler, MD  Electronically Signed    JAT/MedQ  DD: 10/17/2006  DT: 10/17/2006  Job #: [id number]

## 2010-11-23 ENCOUNTER — Other Ambulatory Visit: Payer: Self-pay | Admitting: Internal Medicine

## 2010-11-23 NOTE — Telephone Encounter (Signed)
Rx refill sent to pharmacy. 

## 2010-11-26 ENCOUNTER — Ambulatory Visit: Payer: Self-pay | Admitting: Internal Medicine

## 2010-12-02 ENCOUNTER — Encounter: Payer: Self-pay | Admitting: Family

## 2010-12-02 ENCOUNTER — Ambulatory Visit (INDEPENDENT_AMBULATORY_CARE_PROVIDER_SITE_OTHER): Payer: Medicare Other | Admitting: Family

## 2010-12-02 DIAGNOSIS — R609 Edema, unspecified: Secondary | ICD-10-CM

## 2010-12-02 MED ORDER — FUROSEMIDE 20 MG PO TABS: ORAL_TABLET | ORAL | Status: DC

## 2010-12-02 NOTE — Progress Notes (Signed)
Subjective:    Patient ID: Marcus [name], male    DOB: 1941/05/11, 70 y.o.   MRN: [id number]  HPI  Persistent swelling in his feet.  Previously on HCTZ.  Worse recently.  Some darkening of his feet.  Denies SOB with walking.  He has been walking 3-4 miles 3 times a week on the treadmill.  Denies chest pain.  Able to lay on one pillow without orthopnea.    Problem with his toes- tingling, ball of both feet s "hitting first and the toes are up in the air. "  Review of Systems See HPI  Past Medical History  Diagnosis Date  . Other abnormal glucose   . Unspecified vitamin D deficiency   . Cramp of limb   . Abdominal pain, unspecified site   . Chest pain, unspecified   . Calcaneal spur   . Ventral hernia, unspecified, without mention of obstruction or gangrene   . Cough   . Acute upper respiratory infections of unspecified site   . Achilles bursitis or tendinitis   . Other and unspecified hyperlipidemia   . External hemorrhoids without mention of complication   . Obesity, unspecified   . Diverticulosis of colon (without mention of hemorrhage)   . Diaphragmatic hernia without mention of obstruction or gangrene   . Insomnia, unspecified   . Abdominal pain, unspecified site   . Elevated prostate specific antigen (PSA)   . Gout, unspecified   . Obstructive sleep apnea (adult) (pediatric)   . Unspecified essential hypertension   . Esophageal reflux   . Personal history of malignant neoplasm of large intestine   . Personal history of colonic polyps   . OSA (obstructive sleep apnea)   . Lumbar spondylolysis   . Other abnormal clinical finding     history  of post prandial nausea-related to gallbladder and gallstones 10/2006    History   Social History  . Marital Status: Married    Spouse Name: N/A    Number of Children: N/A  . Years of Education: N/A   Occupational History  . Not on file.   Social History Main Topics  . Smoking status: Former Games developer  . Smokeless  tobacco: Not on file  . Alcohol Use: Not on file  . Drug Use: Not on file  . Sexually Active: Not on file   Other Topics Concern  . Not on file   Social History Narrative     MarriedFormer Smoker Alcohol use-yes 1-2 drinks per day   Occupation: Associate Professor       Past Surgical History  Procedure Date  . Colon resection 1986    wih mesh repair  . Vertical banded gastroplasty 1987  . Cataract extraction extracapsular   . Knee surgery   . Hernia repair   . Back surgery     Family History  Problem Relation Age of Onset  . Stroke Father   . Esophagitis Father     died from perforated esophagus  . Breast cancer Mother   . Hypertension Mother     poor health-passing away-lives in [place]  . Other Neg Hx     FH colon cancer    No Known Allergies  Current Outpatient Prescriptions on File Prior to Visit  Medication Sig Dispense Refill  . allopurinol (ZYLOPRIM) 100 MG tablet Take 100 mg by mouth daily.        [name] Kitchen aspirin 81 MG tablet Take 81 mg by mouth daily.        [name] Kitchen  DIOVAN 320 MG tablet TAKE ONE TABLET BY MOUTH ONE TIME DAILY  30 each  3  . metFORMIN (GLUCOPHAGE) 500 MG tablet TAKE ONE TABLET BY MOUTH TWICE DAILY  60 tablet  3  . omeprazole (PRILOSEC) 40 MG capsule Take 1 capsule daily by mouth before morning meal  90 capsule  1  . oxymetazoline (AFRIN) 0.05 % nasal spray 1 spray by Nasal route at bedtime.        . Potassium 99 MG TABS Take 1 tablet by mouth daily.        . simvastatin (ZOCOR) 10 MG tablet TAKE ONE TABLET BY MOUTH ONE TIME DAILY  90 tablet  0  . DISCONTD: amLODipine (NORVASC) 10 MG tablet TAKE ONE TABLET BY MOUTH ONE TIME DAILY  30 tablet  3  . finasteride (PROSCAR) 5 MG tablet Take 5 mg by mouth daily.         BP 134/74  Pulse 69  Temp(Src) 97.9 F (36.6 C) (Oral)  Resp 18  Wt 274 lb 11.1 oz (124.6 kg)  SpO2 97%       Objective:   Physical Exam  Constitutional: He appears well-developed and well-nourished.  Cardiovascular: Normal rate and  regular rhythm.   Pulmonary/Chest: Effort normal and breath sounds normal.  Musculoskeletal: He exhibits edema.       3+ bilateral LE edema up to mid shin.          Assessment & Plan:  Exp 04/14 [id number] bystolic 5 samples provided #35

## 2010-12-02 NOTE — Assessment & Plan Note (Addendum)
Pt had normal echo performed in the fall.  He does not show any other clinical signs of cardiac etiology for his swelling.  I suspect norvasc is the culprit.  Will switch to bystolic and add lasix prn.  Pt to follow up in 1 month with Dr. Artist Pais. Suspect edema is playing a role in his other foot complaints.  Can re-eval at next visit when edema is hopefully improved.

## 2010-12-02 NOTE — Patient Instructions (Signed)
Stop norvasc, start bystolic.   Follow up with Dr. Artist Pais in 1 month.

## 2010-12-04 ENCOUNTER — Ambulatory Visit: Payer: Self-pay | Admitting: Internal Medicine

## 2011-01-01 ENCOUNTER — Ambulatory Visit (INDEPENDENT_AMBULATORY_CARE_PROVIDER_SITE_OTHER): Payer: Medicare Other | Admitting: Family Medicine

## 2011-01-01 ENCOUNTER — Ambulatory Visit: Payer: Medicare Other | Admitting: Internal Medicine

## 2011-01-01 ENCOUNTER — Encounter: Payer: Self-pay | Admitting: Family Medicine

## 2011-01-01 DIAGNOSIS — I10 Essential (primary) hypertension: Secondary | ICD-10-CM

## 2011-01-01 DIAGNOSIS — R7309 Other abnormal glucose: Secondary | ICD-10-CM

## 2011-01-01 DIAGNOSIS — E785 Hyperlipidemia, unspecified: Secondary | ICD-10-CM

## 2011-01-01 DIAGNOSIS — R209 Unspecified disturbances of skin sensation: Secondary | ICD-10-CM

## 2011-01-01 DIAGNOSIS — R609 Edema, unspecified: Secondary | ICD-10-CM

## 2011-01-01 DIAGNOSIS — R202 Paresthesia of skin: Secondary | ICD-10-CM

## 2011-01-01 MED ORDER — NEBIVOLOL HCL 5 MG PO TABS: 5.0000 mg | ORAL_TABLET | Freq: Every day | ORAL | Status: DC

## 2011-01-01 MED ORDER — FUTURO FIRM COMPRESSION HOSE MISC: Status: DC

## 2011-01-01 MED ORDER — FUROSEMIDE 20 MG PO TABS: ORAL_TABLET | ORAL | Status: DC

## 2011-01-01 NOTE — Assessment & Plan Note (Signed)
Cont. Statin.  Needs repeat lipid panel when fasting.  Will also check NMR lipoprofile.  Last 3 lipids: Lab Results  Component Value Date   CHOL 137 01/19/2010   CHOL  Value: 119        ATP III CLASSIFICATION:  <200     mg/dL   Desirable  147-829  mg/dL   Borderline High  >=562    mg/dL   High        13/02/6577   CHOL 128 04/01/2009   Lab Results  Component Value Date   HDL 47 01/19/2010   HDL 34* 07/02/2009   HDL 44 04/01/2009   Lab Results  Component Value Date   LDLCALC 70 01/19/2010   LDLCALC  Value: 64        Total Cholesterol/HDL:CHD Risk Coronary Heart Disease Risk Table                     Men   Women  1/2 Average Risk   3.4   3.3  Average Risk       5.0   4.4  2 X Average Risk   9.6   7.1  3 X Average Risk  23.4   11.0        Use the calculated Patient Ratio above and the CHD Risk Table to determine the patient's CHD Risk.        ATP III CLASSIFICATION (LDL):  <100     mg/dL   Optimal  469-629  mg/dL   Near or Above                    Optimal  130-159  mg/dL   Borderline  528-413  mg/dL   High  >244     mg/dL   Very High 07/07/7251   LDLCALC 57 04/01/2009   Lab Results  Component Value Date   TRIG 102 01/19/2010   TRIG 107 07/02/2009   TRIG 135 04/01/2009   Lab Results  Component Value Date   CHOLHDL 2.9 Ratio 01/19/2010   CHOLHDL 3.5 07/02/2009   CHOLHDL 2.9 Ratio 04/01/2009   No results found for this basename: LDLDIRECT

## 2011-01-01 NOTE — Assessment & Plan Note (Addendum)
Chronic lower extremity venous insufficiency, improved. Check K since on lasix frequently lately. Add compression stockings to use prn worsening, also discussed elevating legs above the level of the heart when swelling is much worse. Needs to watch Na better. Continue qd prn lasix.

## 2011-01-01 NOTE — Assessment & Plan Note (Signed)
Check B12, TSH, CMET, HbA1c. Ordered lower ext nerve conduction testing. F/u 35mo.

## 2011-01-01 NOTE — Assessment & Plan Note (Signed)
Problem stable.  Continue current medications and diet appropriate for this condition.  We have reviewed our general long term plan for this problem and also reviewed symptoms and signs that should prompt the patient to call or return to the office. Samples x 5wks plus rx done today for bystolic 5mg  qd.

## 2011-01-01 NOTE — Progress Notes (Signed)
OFFICE VISIT  01/01/2011   CC:  Chief Complaint  Patient presents with  . Hyperlipidemia  . Hypertension    c/o numbing in his toes -for the past month     HPI:    Patient is a 70 y.o. Caucasian male who presents for f/u swelling in LE's and toes numb. Swelling better with daily lasix and switch from amlodipine to bystolic.  No side effects felt from bystolic. All toes have been feeling numb for >79mo, waxes and wanes but never completely resolves.  He cannot find anything that makes it better or worse. Denies pain or weakness.  Denies any numbness in hands/fingers.  No color changes in toes.  No calf pain with walking.  No back pain.  Of note, he has routine cardiology f/u next week and routine GI f/u coming up soon.   Past Medical History  Diagnosis Date  . Other abnormal glucose   . Unspecified vitamin D deficiency   . Cramp of limb   . Abdominal pain, unspecified site   . Chest pain, unspecified   . Calcaneal spur   . Ventral hernia, unspecified, without mention of obstruction or gangrene   . Cough   . Acute upper respiratory infections of unspecified site   . Achilles bursitis or tendinitis   . Other and unspecified hyperlipidemia   . External hemorrhoids without mention of complication   . Obesity, unspecified   . Diverticulosis of colon (without mention of hemorrhage)   . Diaphragmatic hernia without mention of obstruction or gangrene   . Insomnia, unspecified   . Abdominal pain, unspecified site   . Elevated prostate specific antigen (PSA)   . Gout, unspecified   . Obstructive sleep apnea (adult) (pediatric)   . Unspecified essential hypertension   . Esophageal reflux   . Personal history of malignant neoplasm of large intestine   . Personal history of colonic polyps   . OSA (obstructive sleep apnea)   . Lumbar spondylolysis   . Other abnormal clinical finding     history  of post prandial nausea-related to gallbladder and gallstones 10/2006    Past  Surgical History  Procedure Date  . Colon resection 1986    wih mesh repair  . Vertical banded gastroplasty 1987  . Cataract extraction extracapsular   . Knee surgery   . Hernia repair   . Back surgery     Outpatient Prescriptions Prior to Visit  Medication Sig Dispense Refill  . allopurinol (ZYLOPRIM) 100 MG tablet Take 100 mg by mouth daily.        [name] Kitchen aspirin 81 MG tablet Take 81 mg by mouth daily.        [name] Kitchen DIOVAN 320 MG tablet TAKE ONE TABLET BY MOUTH ONE TIME DAILY  30 each  3  . omeprazole (PRILOSEC) 40 MG capsule Take 1 capsule daily by mouth before morning meal  90 capsule  1  . oxymetazoline (AFRIN) 0.05 % nasal spray 1 spray by Nasal route at bedtime.        . simvastatin (ZOCOR) 10 MG tablet TAKE ONE TABLET BY MOUTH ONE TIME DAILY  90 tablet  0  . metFORMIN (GLUCOPHAGE) 500 MG tablet TAKE ONE TABLET BY MOUTH TWICE DAILY  60 tablet  3  . nebivolol (BYSTOLIC) 5 MG tablet Take 5 mg by mouth.        . finasteride (PROSCAR) 5 MG tablet Take 5 mg by mouth daily.       . Potassium 99 MG TABS  Take 1 tablet by mouth daily.        . furosemide (LASIX) 20 MG tablet One tablet once daily as needed for swelling  30 tablet  0    No Known Allergies  ROS As per HPI  PE: Blood pressure 122/80, pulse 50, temperature 98.2 F (36.8 C), temperature source Oral, resp. rate 20, weight 280 lb (127.007 kg). Gen: Alert, well appearing.  Patient is oriented to person, place, time, and situation. EXT: from the mid tibia level down to ankles he has 2+pitting edema on right, 1+pitting edema on left.  Very mild hemosiderin skin changes in ant tib surfaces bilat.  No erythema or tenderness.  PT and DP pulses 2+ bilat.  All toes with mild decrease in fine touch sensation.  Toes pink/warm.  LE strength 5/5 prox and dist.  LABS:  none  IMPRESSION AND PLAN:  Edema Chronic lower extremity venous insufficiency, improved. Check K since on lasix frequently lately. Add compression stockings to use prn  worsening, also discussed elevating legs above the level of the heart when swelling is much worse. Needs to watch Na better. Continue qd prn lasix.  Paresthesia Check B12, TSH, CMET, HbA1c. Ordered lower ext nerve conduction testing. F/u 19mo.  HYPERLIPIDEMIA Cont. Statin.  Needs repeat lipid panel when fasting.  Will also check NMR lipoprofile.  Last 3 lipids: Lab Results  Component Value Date   CHOL 137 01/19/2010   CHOL  Value: 119        ATP III CLASSIFICATION:  <200     mg/dL   Desirable  161-096  mg/dL   Borderline High  >=045    mg/dL   High        40/98/1191   CHOL 128 04/01/2009   Lab Results  Component Value Date   HDL 47 01/19/2010   HDL 34* 07/02/2009   HDL 44 04/01/2009   Lab Results  Component Value Date   LDLCALC 70 01/19/2010   LDLCALC  Value: 64        Total Cholesterol/HDL:CHD Risk Coronary Heart Disease Risk Table                     Men   Women  1/2 Average Risk   3.4   3.3  Average Risk       5.0   4.4  2 X Average Risk   9.6   7.1  3 X Average Risk  23.4   11.0        Use the calculated Patient Ratio above and the CHD Risk Table to determine the patient's CHD Risk.        ATP III CLASSIFICATION (LDL):  <100     mg/dL   Optimal  478-295  mg/dL   Near or Above                    Optimal  130-159  mg/dL   Borderline  621-308  mg/dL   High  >657     mg/dL   Very High 84/69/6295   LDLCALC 57 04/01/2009   Lab Results  Component Value Date   TRIG 102 01/19/2010   TRIG 107 07/02/2009   TRIG 135 04/01/2009   Lab Results  Component Value Date   CHOLHDL 2.9 Ratio 01/19/2010   CHOLHDL 3.5 07/02/2009   CHOLHDL 2.9 Ratio 04/01/2009   No results found for this basename: LDLDIRECT     HYPERTENSION Problem stable.  Continue current medications and diet  appropriate for this condition.  We have reviewed our general long term plan for this problem and also reviewed symptoms and signs that should prompt the patient to call or return to the office. Samples x 5wks plus rx done  today for bystolic 5mg  qd.     FOLLOW UP: No Follow-up on file.

## 2011-01-04 LAB — COMPREHENSIVE METABOLIC PANEL
ALT: 19 U/L (ref 0–53)
AST: 17 U/L (ref 0–37)
Albumin: 4.1 g/dL (ref 3.5–5.2)
Alkaline Phosphatase: 56 U/L (ref 39–117)
BUN: 20 mg/dL (ref 6–23)
CO2: 25 mEq/L (ref 19–32)
Calcium: 9.1 mg/dL (ref 8.4–10.5)
Chloride: 107 mEq/L (ref 96–112)
Creat: 1.24 mg/dL (ref 0.50–1.35)
Glucose, Bld: 97 mg/dL (ref 70–99)
Potassium: 4.4 mEq/L (ref 3.5–5.3)
Sodium: 142 mEq/L (ref 135–145)
Total Bilirubin: 0.5 mg/dL (ref 0.3–1.2)
Total Protein: 6.7 g/dL (ref 6.0–8.3)

## 2011-01-04 LAB — VITAMIN B12: Vitamin B-12: 227 pg/mL (ref 211–911)

## 2011-01-04 LAB — HEMOGLOBIN A1C
Hgb A1c MFr Bld: 5.9 % — ABNORMAL HIGH (ref <5.7)
Mean Plasma Glucose: 123 mg/dL — ABNORMAL HIGH (ref <117)

## 2011-01-04 LAB — TSH: TSH: 0.618 u[IU]/mL (ref 0.350–4.500)

## 2011-01-05 ENCOUNTER — Telehealth: Payer: Self-pay | Admitting: Family Medicine

## 2011-01-05 ENCOUNTER — Other Ambulatory Visit: Payer: Self-pay | Admitting: Family Medicine

## 2011-01-05 DIAGNOSIS — R200 Anesthesia of skin: Secondary | ICD-10-CM

## 2011-01-05 NOTE — Telephone Encounter (Signed)
Order placed.  Let me know if anything else is needed.  Thx--PM

## 2011-01-05 NOTE — Telephone Encounter (Signed)
Pt called about NCS    Did not see order , please send order, this was documented in procedure tab.  Thanks [employer]

## 2011-01-06 LAB — NMR LIPOPROFILE WITH LIPIDS
Cholesterol, Total: 126 mg/dL (ref <200)
HDL Particle Number: 28.5 umol/L — ABNORMAL LOW (ref >=30.5)
HDL-C: 43 mg/dL (ref >=40)
LDL (calc): 65 mg/dL (ref <100)
LDL Particle Number: 1092 nmol/L — ABNORMAL HIGH (ref <1000)
LDL Size: 20.5 nm — ABNORMAL LOW (ref >20.5)
LP-IR Score: 69 — ABNORMAL HIGH (ref <=45)
Small LDL Particle Number: 690 nmol/L — ABNORMAL HIGH (ref <=527)
Triglycerides: 90 mg/dL (ref <150)

## 2011-01-08 ENCOUNTER — Other Ambulatory Visit: Payer: Self-pay | Admitting: Family Medicine

## 2011-01-08 ENCOUNTER — Ambulatory Visit (INDEPENDENT_AMBULATORY_CARE_PROVIDER_SITE_OTHER): Payer: Medicare Other | Admitting: Cardiology

## 2011-01-08 ENCOUNTER — Encounter: Payer: Self-pay | Admitting: Cardiology

## 2011-01-08 DIAGNOSIS — R079 Chest pain, unspecified: Secondary | ICD-10-CM

## 2011-01-08 DIAGNOSIS — I498 Other specified cardiac arrhythmias: Secondary | ICD-10-CM

## 2011-01-08 DIAGNOSIS — I10 Essential (primary) hypertension: Secondary | ICD-10-CM

## 2011-01-08 DIAGNOSIS — E785 Hyperlipidemia, unspecified: Secondary | ICD-10-CM

## 2011-01-08 DIAGNOSIS — R609 Edema, unspecified: Secondary | ICD-10-CM

## 2011-01-08 DIAGNOSIS — R001 Bradycardia, unspecified: Secondary | ICD-10-CM

## 2011-01-08 HISTORY — DX: Bradycardia, unspecified: R00.1

## 2011-01-08 MED ORDER — HYDRALAZINE HCL 25 MG PO TABS: 25.0000 mg | ORAL_TABLET | Freq: Three times a day (TID) | ORAL | Status: DC

## 2011-01-08 MED ORDER — SIMVASTATIN 20 MG PO TABS: 20.0000 mg | ORAL_TABLET | Freq: Every day | ORAL | Status: DC

## 2011-01-08 NOTE — Assessment & Plan Note (Signed)
Patient has some fatigue and I wonder if low heart rate is contributing. Dc bystolic

## 2011-01-08 NOTE — Assessment & Plan Note (Signed)
Continue statin. 

## 2011-01-08 NOTE — Progress Notes (Signed)
HPI: 70 year old male for followup of chest pain. Patient apparently with long history of atypical chest pain. Previous cardiac catheterization normal in [place] by his report. Patient saw Dr. Juanda Chance in October of 2011 with complaints of chest pain and dyspnea. Chest x-ray, d-dimer and BNP unremarkable. Myoview in Oct of 2011 showed an ejection fraction of 51%, septal hypokinesis but normal perfusion. Echocardiogram in October of 2011 showed normal LV function, moderate left atrial enlargement, mild right atrial enlargement and grade 1 diastolic dysfunction. Since he was last seen, he denies dyspnea on exertion, orthopnea, PND, chest pain, palpitations or syncope. He has had problems with lower extremity edema. His Norvasc was discontinued and diuretics added. There has been some improvement. There has been some fatigue.  Current Outpatient Prescriptions  Medication Sig Dispense Refill  . allopurinol (ZYLOPRIM) 100 MG tablet Take 100 mg by mouth daily.        [name] Kitchen aspirin 81 MG tablet Take 81 mg by mouth daily.        [name] Kitchen DIOVAN 320 MG tablet TAKE ONE TABLET BY MOUTH ONE TIME DAILY  30 each  3  . Elastic Bandages & Supports (FUTURO FIRM COMPRESSION HOSE) MISC Apply to lower extremities daily as needed for increased swelling  1 each  1  . furosemide (LASIX) 20 MG tablet 20 mg daily.        . metFORMIN (GLUCOPHAGE) 500 MG tablet        . nebivolol (BYSTOLIC) 5 MG tablet Take 1 tablet (5 mg total) by mouth daily.  30 tablet  10  . omeprazole (PRILOSEC) 40 MG capsule Take 1 capsule daily by mouth before morning meal  90 capsule  1  . oxymetazoline (AFRIN) 0.05 % nasal spray 1 spray by Nasal route at bedtime.        . simvastatin (ZOCOR) 20 MG tablet Take 1 tablet (20 mg total) by mouth at bedtime.  30 tablet  2  . DISCONTD: furosemide (LASIX) 20 MG tablet One tablet once daily as needed for swelling  30 tablet  0  . DISCONTD: finasteride (PROSCAR) 5 MG tablet Take 5 mg by mouth daily.       [name] Kitchen DISCONTD:  Potassium 99 MG TABS Take 1 tablet by mouth daily.        [name] Kitchen DISCONTD: simvastatin (ZOCOR) 10 MG tablet TAKE ONE TABLET BY MOUTH ONE TIME DAILY  90 tablet  0     Past Medical History  Diagnosis Date  . Unspecified vitamin D deficiency   . Calcaneal spur   . Ventral hernia, unspecified, without mention of obstruction or gangrene   . Other and unspecified hyperlipidemia   . External hemorrhoids without mention of complication   . Diverticulosis of colon (without mention of hemorrhage)   . Diaphragmatic hernia without mention of obstruction or gangrene   . Elevated prostate specific antigen (PSA)   . Gout, unspecified   . Obstructive sleep apnea (adult) (pediatric)   . Unspecified essential hypertension   . Esophageal reflux   . Personal history of malignant neoplasm of large intestine   . Personal history of colonic polyps   . OSA (obstructive sleep apnea)   . Lumbar spondylolysis     Past Surgical History  Procedure Date  . Colon resection 1986    wih mesh repair  . Vertical banded gastroplasty 1987  . Cataract extraction extracapsular   . Knee surgery   . Hernia repair   . Back surgery     History  Social History  . Marital Status: Married    Spouse Name: N/A    Number of Children: N/A  . Years of Education: N/A   Occupational History  . Not on file.   Social History Main Topics  . Smoking status: Former Games developer  . Smokeless tobacco: Not on file  . Alcohol Use: Not on file  . Drug Use: Not on file  . Sexually Active: Not on file   Other Topics Concern  . Not on file   Social History Narrative     MarriedFormer Smoker Alcohol use-yes 1-2 drinks per day   Occupation: Associate Professor       ROS: fatigue and knee arthralgias but no fevers or chills, productive cough, hemoptysis, dysphasia, odynophagia, melena, hematochezia, dysuria, hematuria, rash, seizure activity, orthopnea, PND, claudication. Remaining systems are negative.  Physical Exam: Well-developed  obese in no acute distress.  Skin is warm and dry.  HEENT is normal.  Neck is supple. No thyromegaly.  Chest is clear to auscultation with normal expansion.  Cardiovascular exam is bradycardic  Abdominal exam nontender or distended. No masses palpated. Extremities show 1+ ankle edema. neuro grossly intact  ECG Marked sinus bradycardia at a rate of 43. No ST changes.

## 2011-01-08 NOTE — Assessment & Plan Note (Signed)
No recurrent symptoms. Previous Myoview negative. No further evaluation.

## 2011-01-08 NOTE — Assessment & Plan Note (Signed)
Blood pressure elevated but patient states normally controlled. However his heart rate is 43. Decrease bystolic to 2.5 mg daily for 2 days and then DC. I will not add Norvasc given recent pedal edema. Instead I will hydralazine 25 mg p.o. T.i.d. We can increase this further as needed.

## 2011-01-08 NOTE — Patient Instructions (Signed)
TAKE 1/2 TABLET OF BYSTOLIC ONCE DAILY FOR THE NEXT TWO DAYS AND THEN STOP  START HYDRALAZINE 25MG  ONE TABLET THREE TIMES DAILY

## 2011-01-08 NOTE — Assessment & Plan Note (Signed)
Continue low-dose diuretic. Patient now off Norvasc. Keep feet elevated.

## 2011-01-11 ENCOUNTER — Other Ambulatory Visit: Payer: Self-pay | Admitting: Family Medicine

## 2011-01-11 ENCOUNTER — Telehealth: Payer: Self-pay | Admitting: Internal Medicine

## 2011-01-11 ENCOUNTER — Ambulatory Visit (INDEPENDENT_AMBULATORY_CARE_PROVIDER_SITE_OTHER): Payer: Medicare Other | Admitting: Family Medicine

## 2011-01-11 DIAGNOSIS — E538 Deficiency of other specified B group vitamins: Secondary | ICD-10-CM

## 2011-01-11 DIAGNOSIS — R202 Paresthesia of skin: Secondary | ICD-10-CM

## 2011-01-11 MED ORDER — CYANOCOBALAMIN 1000 MCG/ML IJ SOLN: INTRAMUSCULAR | Status: DC

## 2011-01-11 MED ORDER — CYANOCOBALAMIN 1000 MCG/ML IJ SOLN
1000.0000 ug | Freq: Once | INTRAMUSCULAR | Status: AC
  Administered 2011-01-11: 1000 ug via INTRAMUSCULAR

## 2011-01-11 NOTE — Progress Notes (Signed)
Addended by: Luisa Dago on: 01/11/2011 04:18 PM   Modules accepted: Orders

## 2011-01-11 NOTE — Telephone Encounter (Signed)
Pt states that he is supposed to take a series of b12 inj. Pt states that he talked to darlene about getting injections at home. His wife is a Engineer, civil (consulting). Pt states that no rx about injections were sent into [facility]([address]

## 2011-01-11 NOTE — Telephone Encounter (Signed)
Advised pt RX was not sent to pharm as we believed he would be in today for injection and we would send RX at that time.  Pt made appt for 1:15 today.

## 2011-01-13 ENCOUNTER — Telehealth: Payer: Self-pay | Admitting: *Deleted

## 2011-01-13 NOTE — Telephone Encounter (Signed)
Received message from pt requesting status of nerve conduction study appointment. Advised pt that records have been faxed to neuro and we are waiting for a return call from them. Will check status tomorrow. Marcus [name], can you please check status and call pt. He states [place] country near [place] end of July.

## 2011-01-19 ENCOUNTER — Telehealth: Payer: Self-pay | Admitting: Gastroenterology

## 2011-01-19 ENCOUNTER — Encounter: Payer: Self-pay | Admitting: Gastroenterology

## 2011-01-19 ENCOUNTER — Ambulatory Visit (INDEPENDENT_AMBULATORY_CARE_PROVIDER_SITE_OTHER): Payer: Medicare Other | Admitting: Gastroenterology

## 2011-01-19 ENCOUNTER — Other Ambulatory Visit (INDEPENDENT_AMBULATORY_CARE_PROVIDER_SITE_OTHER): Payer: Medicare Other

## 2011-01-19 DIAGNOSIS — K649 Unspecified hemorrhoids: Secondary | ICD-10-CM

## 2011-01-19 DIAGNOSIS — R6889 Other general symptoms and signs: Secondary | ICD-10-CM

## 2011-01-19 DIAGNOSIS — Z85048 Personal history of other malignant neoplasm of rectum, rectosigmoid junction, and anus: Secondary | ICD-10-CM

## 2011-01-19 DIAGNOSIS — D509 Iron deficiency anemia, unspecified: Secondary | ICD-10-CM

## 2011-01-19 HISTORY — DX: Unspecified hemorrhoids: K64.9

## 2011-01-19 HISTORY — DX: Iron deficiency anemia, unspecified: D50.9

## 2011-01-19 LAB — CBC WITH DIFFERENTIAL/PLATELET
Basophils Absolute: 0 10*3/uL (ref 0.0–0.1)
Basophils Relative: 0.3 % (ref 0.0–3.0)
Eosinophils Absolute: 0.1 10*3/uL (ref 0.0–0.7)
Eosinophils Relative: 1.4 % (ref 0.0–5.0)
HCT: 42.8 % (ref 39.0–52.0)
Hemoglobin: 14.4 g/dL (ref 13.0–17.0)
Lymphocytes Relative: 20.7 % (ref 12.0–46.0)
Lymphs Abs: 1.4 10*3/uL (ref 0.7–4.0)
MCHC: 33.5 g/dL (ref 30.0–36.0)
MCV: 85.5 fl (ref 78.0–100.0)
Monocytes Absolute: 0.6 10*3/uL (ref 0.1–1.0)
Monocytes Relative: 8.1 % (ref 3.0–12.0)
Neutro Abs: 4.8 10*3/uL (ref 1.4–7.7)
Neutrophils Relative %: 69.5 % (ref 43.0–77.0)
Platelets: 194 10*3/uL (ref 150.0–400.0)
RBC: 5.01 Mil/uL (ref 4.22–5.81)
RDW: 15.6 % — ABNORMAL HIGH (ref 11.5–14.6)
WBC: 6.9 10*3/uL (ref 4.5–10.5)

## 2011-01-19 LAB — HEPATIC FUNCTION PANEL
ALT: 25 U/L (ref 0–53)
AST: 30 U/L (ref 0–37)
Albumin: 4.2 g/dL (ref 3.5–5.2)
Alkaline Phosphatase: 58 U/L (ref 39–117)
Bilirubin, Direct: 0.1 mg/dL (ref 0.0–0.3)
Total Bilirubin: 0.6 mg/dL (ref 0.3–1.2)
Total Protein: 7.2 g/dL (ref 6.0–8.3)

## 2011-01-19 LAB — IBC PANEL
Iron: 48 ug/dL (ref 42–165)
Saturation Ratios: 11.6 % — ABNORMAL LOW (ref 20.0–50.0)
Transferrin: 295.1 mg/dL (ref 212.0–360.0)

## 2011-01-19 LAB — BASIC METABOLIC PANEL
BUN: 29 mg/dL — ABNORMAL HIGH (ref 6–23)
CO2: 26 mEq/L (ref 19–32)
Calcium: 9.3 mg/dL (ref 8.4–10.5)
Chloride: 108 mEq/L (ref 96–112)
Creatinine, Ser: 1.3 mg/dL (ref 0.4–1.5)
GFR: 56.32 mL/min — ABNORMAL LOW (ref >60.00)
Glucose, Bld: 109 mg/dL — ABNORMAL HIGH (ref 70–99)
Potassium: 4.7 mEq/L (ref 3.5–5.1)
Sodium: 141 mEq/L (ref 135–145)

## 2011-01-19 LAB — FOLATE: Folate: 19.4 ng/mL (ref >5.9)

## 2011-01-19 LAB — VITAMIN B12: Vitamin B-12: >1500 pg/mL — ABNORMAL HIGH (ref 211–911)

## 2011-01-19 LAB — TSH: TSH: 0.56 u[IU]/mL (ref 0.35–5.50)

## 2011-01-19 MED ORDER — HYDROCORTISONE ACE-PRAMOXINE 1-1 % RE CREA: TOPICAL_CREAM | Freq: Two times a day (BID) | RECTAL | Status: AC

## 2011-01-19 NOTE — Patient Instructions (Signed)
Your prescription(s) have been sent to you pharmacy.  Please go to the basement today for your labs.  Do Sitz baths twice a day Make an appt to come back and see Dr Jarold Motto in 2 weeks.    Sitz Bath A sitz bath is a warm water bath taken in the sitting position that covers only the hips and buttocks. It may be used for either healing or hygiene purposes. The water may contain medication. Sitz baths are often used to relieve pain, itching or muscle spasms. Moist heat will help you heal and relax. Follow these steps carefully: HOME CARE INSTRUCTIONS  Fill the bathtub half full with warm water. Not Too Hot!   Sit in the water and open the drain a little.   Turn on the warm water to keep the tub half full. Keep the water running constantly.   Soak in the water for 15 to 20 minutes.   After the sitz bath, blot dry the affected part first.   Take 3 to 4 sitz baths a day.  SEEK MEDICAL CARE IF: You get worse instead of better; stop the sitz baths. Document Released: 03/13/2004 Document Re-Released: 07/13/2009 [facility] Patient Information 2011 [place], [place].

## 2011-01-19 NOTE — Telephone Encounter (Signed)
Left message a new rx for analpram singles was sent looks like insurance has better coverage on this medication

## 2011-01-19 NOTE — Progress Notes (Signed)
This is a very pleasant 70 year old Caucasian male that I have followed for several years her previous history of partial colon resection in [place] for colon cancer. Last colonoscopy was several years ago was fairly unremarkable. He now presents with a" lump" in his perianal area with without true discomfort or rectal bleeding. He denies systemic complaints. Medications are listed and reviewed.  Current Medications, Allergies, Past Medical History, Past Surgical History, Family History and Social History were reviewed in [employer] record.  Pertinent Review of Systems Negative   Physical Exam: Very obese abdomen making exam difficult, but I can appreciate no definite organomegaly, masses, tenderness, or ascites. There is a long linear abdominal scar noted. Inspection of rectum shows a posterior partially thrombosed external hemorrhoid. I cannot appreciate any fissure or fistula. Rectal exam shows no masses or tenderness with soft stool present is guaiac negative.   Assessment and Plan: Partially thrombosed external hemorrhoid that hopefully will respond to medical therapy. I have placed him on Sitz twice a day with local Analpram cream twice a day. He will check back with me in 2 weeks' time for followup. He may need surgical referral for hemorrhoidectomy therapy. The patient is on aspirin but not other anticoagulants. He takes Prilosec daily for GERD which seems well controlled. Encounter Diagnoses  Name Primary?  . Hemorrhoids Yes  . Other general symptoms    . Iron deficiency anemia, unspecified

## 2011-01-20 ENCOUNTER — Encounter: Payer: Self-pay | Admitting: Gastroenterology

## 2011-01-20 LAB — FERRITIN: Ferritin: 24.8 ng/mL (ref 22.0–322.0)

## 2011-01-22 ENCOUNTER — Telehealth: Payer: Self-pay | Admitting: Internal Medicine

## 2011-01-22 MED ORDER — ALLOPURINOL 100 MG PO TABS: 100.0000 mg | ORAL_TABLET | Freq: Every day | ORAL | Status: DC

## 2011-01-22 NOTE — Telephone Encounter (Signed)
Refill- allopurinol 100mg  tab qual. Take one tablet by mouth one time daily. Qty 30 last fill 6.16.12

## 2011-01-22 NOTE — Telephone Encounter (Signed)
Spoke to [facility] and gave verbal auth for #90 x 1 refill.

## 2011-01-28 ENCOUNTER — Telehealth: Payer: Self-pay | Admitting: Family Medicine

## 2011-01-28 NOTE — Telephone Encounter (Signed)
Please notify patient that his nerve conduction testing was just mildly abnormal, but in a way related to the degenerative arthritis changes in his low back.  In other words the abnormality noted did not correlate with the complaint he is having (toes tingling/numb).   His numbness is presumably from a combination of very mild diabetes and vit B12 deficiency.   No further nerve testing needed.  Needs to f/u in office in about 2 months.

## 2011-01-29 NOTE — Telephone Encounter (Signed)
Call placed to patient at [phone], routed via- Tour manager, no answer. No voice mail.

## 2011-02-01 ENCOUNTER — Ambulatory Visit: Payer: Medicare Other | Admitting: Internal Medicine

## 2011-02-02 NOTE — Telephone Encounter (Signed)
Call placed to patient at [phone],patients wife Corrie Dandy stated patient was not available. Message left with patients wife Corrie Dandy to have patient return phone call regarding nerve testing on Wednesday.

## 2011-02-03 NOTE — Telephone Encounter (Signed)
Call returned by patient. He left a voice message to contact him at [phone]. Call was returned to patient at phone number provided. He was informed per Dr Milinda Cave instructions and has verbalized understanding. Patient was offered to schedule the follow up appointment. He declined stating that he will call the office back to schedule.

## 2011-02-05 ENCOUNTER — Ambulatory Visit (INDEPENDENT_AMBULATORY_CARE_PROVIDER_SITE_OTHER): Payer: Medicare Other | Admitting: Gastroenterology

## 2011-02-05 ENCOUNTER — Encounter: Payer: Self-pay | Admitting: Gastroenterology

## 2011-02-05 VITALS — BP 102/62 | HR 80 | Ht 69.0 in | Wt 253.0 lb

## 2011-02-05 DIAGNOSIS — K644 Residual hemorrhoidal skin tags: Secondary | ICD-10-CM

## 2011-02-05 MED ORDER — HYDROCORTISONE ACE-PRAMOXINE 1-1 % RE CREA: TOPICAL_CREAM | Freq: Two times a day (BID) | RECTAL | Status: AC

## 2011-02-05 NOTE — Progress Notes (Signed)
This is a 70 year old Caucasian male with external hemorrhoids. He really has no symptoms except for a palpable protrusion at his anorectal area. He is up-to-date on his endoscopy and colonoscopy.  Current Medications, Allergies, Past Medical History, Past Surgical History, Family History and Social History were reviewed in [employer] record.  Pertinent Review of Systems Negative   Physical Exam: Rectum exam shows no fissures or fistulae. There is a partially thrombosed posterior lateral external hemorrhoid which is much softer than on previous exam. Digital exam is deferred at this.    Assessment and Plan: External hemorrhoid which has shrunken in size with Sitz Mass supplied by a hand held hot water source, Analpram cream, fiber supplements as needed. I do not think further evaluation or treatment is indicated at this time. We will see him on a when necessary basis as needed in the future. No diagnosis found.

## 2011-02-05 NOTE — Patient Instructions (Signed)
Dr Basilio Cairo is a Chiropractor his number is [phone]. Continue your Analpram.

## 2011-02-05 NOTE — Progress Notes (Signed)
Addended by: Harlow Mares D on: 02/05/2011 11:28 AM   Modules accepted: Orders

## 2011-02-15 ENCOUNTER — Telehealth: Payer: Self-pay | Admitting: Internal Medicine

## 2011-02-15 NOTE — Telephone Encounter (Signed)
Patient would like to be referred back to Dr. Coral[facility] for back pain. Patient has seen Dr. Dutch Quint before. [facility] told patient that he would have to be referred since it has been four years.

## 2011-02-16 ENCOUNTER — Telehealth: Payer: Self-pay | Admitting: Internal Medicine

## 2011-02-16 ENCOUNTER — Other Ambulatory Visit: Payer: Self-pay | Admitting: Internal Medicine

## 2011-02-16 DIAGNOSIS — M549 Dorsalgia, unspecified: Secondary | ICD-10-CM

## 2011-02-16 NOTE — Telephone Encounter (Signed)
Refill-furosemide 20mg  tab ranb. Take one tablet by mouth once daily as needed for swelling. Qty 30. Last fill 6.29.12

## 2011-02-17 MED ORDER — FUROSEMIDE 20 MG PO TABS: 20.0000 mg | ORAL_TABLET | Freq: Every day | ORAL | Status: DC

## 2011-02-17 NOTE — Telephone Encounter (Signed)
Rx refill sent to pharmacy. 

## 2011-02-18 ENCOUNTER — Telehealth: Payer: Self-pay | Admitting: Internal Medicine

## 2011-02-18 DIAGNOSIS — E538 Deficiency of other specified B group vitamins: Secondary | ICD-10-CM

## 2011-02-18 MED ORDER — CYANOCOBALAMIN 1000 MCG/ML IJ SOLN: 1000.0000 ug | INTRAMUSCULAR | Status: DC

## 2011-02-18 NOTE — Telephone Encounter (Signed)
Rx refill sent to pharmacy. 

## 2011-02-18 NOTE — Telephone Encounter (Signed)
He needs to pick up some b 12 for his monthly shots at [facility] on [address].

## 2011-02-23 ENCOUNTER — Telehealth: Payer: Self-pay | Admitting: Internal Medicine

## 2011-02-23 NOTE — Telephone Encounter (Signed)
Patient would like to know when last tetanus shot was.

## 2011-02-23 NOTE — Telephone Encounter (Signed)
Call returned to patient at [phone]. [place] stated [place] needed to know when his last shot was. He was informed his last tetanus was documented for 03/31/2009.

## 2011-03-17 ENCOUNTER — Telehealth: Payer: Self-pay | Admitting: Internal Medicine

## 2011-03-17 MED ORDER — VALSARTAN 320 MG PO TABS: 320.0000 mg | ORAL_TABLET | Freq: Every day | ORAL | Status: DC

## 2011-03-17 MED ORDER — METFORMIN HCL 500 MG PO TABS: 500.0000 mg | ORAL_TABLET | Freq: Two times a day (BID) | ORAL | Status: DC

## 2011-03-17 NOTE — Telephone Encounter (Signed)
Rx refills sent to pharmacy. 

## 2011-03-17 NOTE — Telephone Encounter (Signed)
Refill- metformin 500mg  tab cara. Take one tablet by mouth twice daily. Qty 60. Last fill 8.18.12  Refill- diovan 320mg  tab nova. Take one tablet by mouth one time daily. Qty 30. Last fill 8.18.12

## 2011-03-25 ENCOUNTER — Telehealth: Payer: Self-pay | Admitting: Internal Medicine

## 2011-03-25 DIAGNOSIS — E538 Deficiency of other specified B group vitamins: Secondary | ICD-10-CM

## 2011-03-25 MED ORDER — CYANOCOBALAMIN 1000 MCG/ML IJ SOLN: 1000.0000 ug | INTRAMUSCULAR | Status: DC

## 2011-03-25 NOTE — Telephone Encounter (Signed)
PATIENT LOST HIS VIAL OF CYANOCOBALAM INJ AMER WHILE TRAVELING

## 2011-03-25 NOTE — Telephone Encounter (Signed)
Replacement sent to pharmacy.

## 2011-03-31 ENCOUNTER — Encounter: Payer: Self-pay | Admitting: Internal Medicine

## 2011-03-31 ENCOUNTER — Ambulatory Visit (INDEPENDENT_AMBULATORY_CARE_PROVIDER_SITE_OTHER): Payer: Medicare Other | Admitting: Internal Medicine

## 2011-03-31 VITALS — BP 142/78 | HR 64 | Temp 98.2°F | Wt 231.0 lb

## 2011-03-31 DIAGNOSIS — R222 Localized swelling, mass and lump, trunk: Secondary | ICD-10-CM | POA: Insufficient documentation

## 2011-03-31 DIAGNOSIS — R7303 Prediabetes: Secondary | ICD-10-CM

## 2011-03-31 DIAGNOSIS — R7309 Other abnormal glucose: Secondary | ICD-10-CM

## 2011-03-31 DIAGNOSIS — I10 Essential (primary) hypertension: Secondary | ICD-10-CM

## 2011-03-31 LAB — BASIC METABOLIC PANEL
BUN: 27 mg/dL — ABNORMAL HIGH (ref 6–23)
CO2: 26 mEq/L (ref 19–32)
Calcium: 9.2 mg/dL (ref 8.4–10.5)
Chloride: 106 mEq/L (ref 96–112)
Creatinine, Ser: 1.3 mg/dL (ref 0.4–1.5)
GFR: 56.29 mL/min — ABNORMAL LOW (ref >60.00)
Glucose, Bld: 80 mg/dL (ref 70–99)
Potassium: 4.4 mEq/L (ref 3.5–5.1)
Sodium: 143 mEq/L (ref 135–145)

## 2011-03-31 MED ORDER — SIMVASTATIN 20 MG PO TABS: 20.0000 mg | ORAL_TABLET | Freq: Every day | ORAL | Status: DC

## 2011-03-31 MED ORDER — VALSARTAN 320 MG PO TABS: 320.0000 mg | ORAL_TABLET | Freq: Every day | ORAL | Status: DC

## 2011-03-31 NOTE — Assessment & Plan Note (Signed)
He never started bystolic.  His BP is 136/82 with manual cuff.  Continue diovan, hydralazine and lasix for now.  I suspect his BP may drop with his significant weight loss.  Monitor BP at home.  Call with readings in one month.  Monitor BMET.

## 2011-03-31 NOTE — Assessment & Plan Note (Signed)
Improved.  Pt has made significant lifestyle changes.  DC metformin

## 2011-03-31 NOTE — Progress Notes (Signed)
Subjective:    Patient ID: Marcus [name], male    DOB: 27-Dec-1940, 70 y.o.   MRN: [id number]  HPI  69 y/o white male with hx of htn, obesity and prediabetes for follow up.  Since prev visit has intentionally lost almost 50 lbs.  He had contest with family members on who could lose the most weight.  He has accomplished by closely monitoring his calorie intake.    However, since his wt loss he is concerned about bony prominence lower sternum area.  It is non tender.  Review of Systems Negative for chest pain or SOB.  Past Medical History  Diagnosis Date  . Unspecified vitamin D deficiency   . Calcaneal spur   . Ventral hernia, unspecified, without mention of obstruction or gangrene   . Other and unspecified hyperlipidemia   . External hemorrhoids without mention of complication   . Diverticulosis of colon (without mention of hemorrhage)   . Hiatal hernia   . Elevated prostate specific antigen (PSA)   . Gout, unspecified   . Obstructive sleep apnea (adult) (pediatric)   . Unspecified essential hypertension   . Esophageal reflux   . Personal history of malignant neoplasm of large intestine   . Personal history of colonic polyps 08/21/2009    TUBULAR ADENOMAS  . OSA (obstructive sleep apnea)   . Lumbar spondylolysis   . Vitamin B12 deficiency   . Postgastric surgery syndromes   . Hiatal hernia     History   Social History  . Marital Status: Married    Spouse Name: N/A    Number of Children: N/A  . Years of Education: N/A   Occupational History  . semi retired    Social History Main Topics  . Smoking status: Former Games developer  . Smokeless tobacco: Not on file  . Alcohol Use: No     currently no alcoholic beveraages (on a diet)  . Drug Use: No  . Sexually Active: Not on file   Other Topics Concern  . Not on file   Social History Narrative     MarriedFormer Smoker Alcohol use-yes 1-2 drinks per day   Occupation: Associate Professor       Past Surgical History  Procedure  Date  . Colon resection 1986    wih mesh repair  . Vertical banded gastroplasty 1987  . Cataract extraction extracapsular     bilateral  . Knee surgery     left  . Hernia repair   . Back surgery     Family History  Problem Relation Age of Onset  . Stroke Father   . Esophagitis Father     died from perforated esophagus  . Breast cancer Mother   . Heart disease Paternal Grandfather   . Colon cancer Maternal Grandmother     questionable    No Known Allergies  Current Outpatient Prescriptions on File Prior to Visit  Medication Sig Dispense Refill  . allopurinol (ZYLOPRIM) 100 MG tablet Take 1 tablet (100 mg total) by mouth daily.  90 tablet  1  . aspirin 81 MG tablet Take 81 mg by mouth daily.        . cyanocobalamin (,VITAMIN B-12,) 1000 MCG/ML injection Inject 1 mL (1,000 mcg total) into the muscle every 30 (thirty) days.  10 mL  0  . furosemide (LASIX) 20 MG tablet Take 1 tablet (20 mg total) by mouth daily.  30 tablet  6  . hydrALAZINE (APRESOLINE) 25 MG tablet Take 1 tablet (25 mg  total) by mouth 3 (three) times daily.  90 tablet  11  . omeprazole (PRILOSEC) 40 MG capsule Take 1 capsule daily by mouth before morning meal  90 capsule  1  . oxymetazoline (AFRIN) 0.05 % nasal spray 1 spray by Nasal route at bedtime.          BP 142/78  Pulse 64  Temp(Src) 98.2 F (36.8 C) (Oral)  Wt 231 lb (104.781 kg)       Objective:   Physical Exam   Constitutional: Appears well-developed and well-nourished. No distress.  Head: Normocephalic and atraumatic.  Right Ear: External ear normal.  Left Ear: External ear normal.  Mouth/Throat: Oropharynx is clear and moist.  Eyes: Conjunctivae are normal. Pupils are equal, round, and reactive to light.  Neck: Normal range of motion. Neck supple. No thyromegaly present. No carotid bruit Cardiovascular: Normal rate, regular rhythm and normal heart sounds.  Exam reveals no gallop and no friction rub.   No murmur heard. Pulmonary/Chest:  Effort normal and breath sounds normal.  No wheezes. No rales.  Abdominal: Soft. Bowel sounds are normal. No mass. There is no tenderness.  Neurological: Alert. No cranial nerve deficit.  Skin: Skin is warm and dry.  Psychiatric: Normal mood and affect. Behavior is normal.         Assessment & Plan:

## 2011-03-31 NOTE — Patient Instructions (Addendum)
Monitor your blood pressure at home as directed.  Call our office if there is significant change. Call our office with BP readings in 1 month.

## 2011-03-31 NOTE — Assessment & Plan Note (Signed)
Pt lost 50 lbs over last 6 months.  He is concerned about bony prominence.  Reassured pt - only xiphoid process.

## 2011-04-13 LAB — POCT I-STAT 4, (NA,K, GLUC, HGB,HCT)
Glucose, Bld: 102 — ABNORMAL HIGH
HCT: 41
Hemoglobin: 13.9
Operator id: 268271
Potassium: 3.6
Sodium: 142

## 2011-04-15 ENCOUNTER — Ambulatory Visit (INDEPENDENT_AMBULATORY_CARE_PROVIDER_SITE_OTHER): Payer: Medicare Other | Admitting: Family Medicine

## 2011-04-15 ENCOUNTER — Encounter: Payer: Self-pay | Admitting: Family Medicine

## 2011-04-15 VITALS — BP 130/78 | Temp 98.4°F | Wt 233.0 lb

## 2011-04-15 DIAGNOSIS — L03119 Cellulitis of unspecified part of limb: Secondary | ICD-10-CM

## 2011-04-15 DIAGNOSIS — L02619 Cutaneous abscess of unspecified foot: Secondary | ICD-10-CM

## 2011-04-15 DIAGNOSIS — S90829A Blister (nonthermal), unspecified foot, initial encounter: Secondary | ICD-10-CM

## 2011-04-15 DIAGNOSIS — IMO0002 Reserved for concepts with insufficient information to code with codable children: Secondary | ICD-10-CM

## 2011-04-15 MED ORDER — CEPHALEXIN 500 MG PO CAPS: 500.0000 mg | ORAL_CAPSULE | Freq: Three times a day (TID) | ORAL | Status: AC

## 2011-04-15 NOTE — Progress Notes (Signed)
  Subjective:    Patient ID: Marcus [name], male    DOB: [birth year], 70 y.o.   MRN: [id number]  HPI Prediabetic seen with blister left lateral foot noted 2 weeks ago. Walks about 8 miles per day on treadmill. Occurred after walking. No recent change of shoe wear. Past couple days noticed some slight redness anterior to blister. No drainage. No fever or chills. No history of peripheral vascular disease. Nonsmoker.  No neuropathy.   Review of Systems  Constitutional: Negative for fever and chills.  Musculoskeletal: Negative for gait problem.  Skin: Positive for rash.       Objective:   Physical Exam  Constitutional: He appears well-developed and well-nourished.  Cardiovascular: Normal rate and regular rhythm.   Pulmonary/Chest: Effort normal and breath sounds normal. No respiratory distress. He has no wheezes. He has no rales.  Skin:       1 cm blister left lateral heel.  This was unroofed and no purulent drainage. Approximately 1-2 cm area erythema anterior to blister.  Minimally tender to palpation          Assessment & Plan:  Small vesicle left foot related to recent walking. Early cellulitis. Cephalexin 500 mg 3 times a day for 10 days. Good supportive shoes and sock wear. Suggested Vaseline on feet prior to walking in future

## 2011-04-15 NOTE — Patient Instructions (Signed)
Sock use at all times with walking Consider vaseline application to feet prior to walking.

## 2011-04-16 ENCOUNTER — Ambulatory Visit: Payer: Medicare Other

## 2011-04-16 ENCOUNTER — Ambulatory Visit: Payer: Medicare Other | Admitting: Internal Medicine

## 2011-05-17 ENCOUNTER — Encounter: Payer: Self-pay | Admitting: Internal Medicine

## 2011-05-17 ENCOUNTER — Ambulatory Visit (INDEPENDENT_AMBULATORY_CARE_PROVIDER_SITE_OTHER): Payer: Medicare Other | Admitting: Internal Medicine

## 2011-05-17 VITALS — BP 160/80 | HR 52 | Temp 98.6°F | Wt 237.0 lb

## 2011-05-17 DIAGNOSIS — R05 Cough: Secondary | ICD-10-CM

## 2011-05-17 DIAGNOSIS — R059 Cough, unspecified: Secondary | ICD-10-CM

## 2011-05-17 MED ORDER — IPRATROPIUM BROMIDE 0.03 % NA SOLN: 2.0000 | Freq: Two times a day (BID) | NASAL | Status: DC

## 2011-05-17 MED ORDER — CEFUROXIME AXETIL 500 MG PO TABS: 500.0000 mg | ORAL_TABLET | Freq: Two times a day (BID) | ORAL | Status: AC

## 2011-05-17 NOTE — Assessment & Plan Note (Signed)
70 year old male with 1-2 weeks of cough productive of yellowish-green sputum. On exam his lungs are clear. I suspect his symptoms are from sinusitis. Treat with cefuroxime 500 mg twice a day x10 days. Patient advised to discontinue use of Afrin. Use ipratropium nasal 0.03% twice a day as needed. Patient advised to call office if symptoms persist or worsen.

## 2011-05-17 NOTE — Patient Instructions (Signed)
Please call our office if your symptoms do not improve or gets worse.  

## 2011-05-17 NOTE — Progress Notes (Signed)
Subjective:    Patient ID: Marcus [name], male    DOB: 04-Sep-1940, 70 y.o.   MRN: [id number]  HPI  70 year old white male complains of cough times one to 2 weeks. He denies preceding URI symptoms. His cough has been productive of yellowish-green sputum. He denies fever or shortness of breath. He has chronic nasal congestion and has been using Afrin for the last 2 years.  Review of Systems    see history of present illness  Past Medical History  Diagnosis Date  . Unspecified vitamin D deficiency   . Calcaneal spur   . Ventral hernia, unspecified, without mention of obstruction or gangrene   . Other and unspecified hyperlipidemia   . External hemorrhoids without mention of complication   . Diverticulosis of colon (without mention of hemorrhage)   . Hiatal hernia   . Elevated prostate specific antigen (PSA)   . Gout, unspecified   . Obstructive sleep apnea (adult) (pediatric)   . Unspecified essential hypertension   . Esophageal reflux   . Personal history of malignant neoplasm of large intestine   . Personal history of colonic polyps 08/21/2009    TUBULAR ADENOMAS  . OSA (obstructive sleep apnea)   . Lumbar spondylolysis   . Vitamin B12 deficiency   . Postgastric surgery syndromes   . Hiatal hernia     History   Social History  . Marital Status: Married    Spouse Name: N/A    Number of Children: N/A  . Years of Education: N/A   Occupational History  . semi retired    Social History Main Topics  . Smoking status: Former Games developer  . Smokeless tobacco: Not on file  . Alcohol Use: No     currently no alcoholic beveraages (on a diet)  . Drug Use: No  . Sexually Active: Not on file   Other Topics Concern  . Not on file   Social History Narrative     MarriedFormer Smoker Alcohol use-yes 1-2 drinks per day   Occupation: Associate Professor       Past Surgical History  Procedure Date  . Colon resection 1986    wih mesh repair  . Vertical banded gastroplasty 1987  .  Cataract extraction extracapsular     bilateral  . Knee surgery     left  . Hernia repair   . Back surgery     Family History  Problem Relation Age of Onset  . Stroke Father   . Esophagitis Father     died from perforated esophagus  . Breast cancer Mother   . Heart disease Paternal Grandfather   . Colon cancer Maternal Grandmother     questionable    No Known Allergies  Current Outpatient Prescriptions on File Prior to Visit  Medication Sig Dispense Refill  . allopurinol (ZYLOPRIM) 100 MG tablet Take 1 tablet (100 mg total) by mouth daily.  90 tablet  1  . aspirin 81 MG tablet Take 81 mg by mouth daily.        . cyanocobalamin (,VITAMIN B-12,) 1000 MCG/ML injection Inject 1 mL (1,000 mcg total) into the muscle every 30 (thirty) days.  10 mL  0  . furosemide (LASIX) 20 MG tablet Take 1 tablet (20 mg total) by mouth daily.  30 tablet  6  . hydrALAZINE (APRESOLINE) 25 MG tablet Take 1 tablet (25 mg total) by mouth 3 (three) times daily.  90 tablet  11  . metFORMIN (GLUCOPHAGE) 500 MG tablet Take 500 mg  by mouth 2 (two) times daily with a meal.       . omeprazole (PRILOSEC) 40 MG capsule Take 1 capsule daily by mouth before morning meal  90 capsule  1  . simvastatin (ZOCOR) 20 MG tablet Take 1 tablet (20 mg total) by mouth at bedtime.  90 tablet  1  . valsartan (DIOVAN) 320 MG tablet Take 1 tablet (320 mg total) by mouth daily.  90 tablet  1    BP 182/90  Pulse 52  Temp(Src) 98.6 F (37 C) (Oral)  Wt 237 lb (107.502 kg)  SpO2 97%    Objective:   Physical Exam  Constitutional: He appears well-developed and well-nourished.  HENT:  Head: Normocephalic and atraumatic.  Right Ear: External ear normal.  Left Ear: External ear normal.  Mouth/Throat: No oropharyngeal exudate.  Eyes: Right eye exhibits no discharge.  Neck: Neck supple.  Cardiovascular: Normal rate, regular rhythm and normal heart sounds.   Pulmonary/Chest: Effort normal and breath sounds normal. No  respiratory distress. He has no wheezes. He has no rales.  Lymphadenopathy:    He has no cervical adenopathy.  Skin: Skin is warm and dry.  Psychiatric: He has a normal mood and affect. His behavior is normal.          Assessment & Plan:

## 2011-05-25 ENCOUNTER — Telehealth: Payer: Self-pay | Admitting: Internal Medicine

## 2011-05-25 MED ORDER — OMEPRAZOLE 40 MG PO CPDR: DELAYED_RELEASE_CAPSULE | ORAL | Status: DC

## 2011-05-25 NOTE — Telephone Encounter (Signed)
Refill-prilosec 40mg  cap astr. Take one capsule every day before meal. Qty 90. Last fill 8.13.12

## 2011-05-25 NOTE — Telephone Encounter (Signed)
rx called in, pt aware 

## 2011-06-07 ENCOUNTER — Encounter: Payer: Self-pay | Admitting: Internal Medicine

## 2011-06-07 ENCOUNTER — Ambulatory Visit (INDEPENDENT_AMBULATORY_CARE_PROVIDER_SITE_OTHER): Payer: Medicare Other | Admitting: Internal Medicine

## 2011-06-07 DIAGNOSIS — I10 Essential (primary) hypertension: Secondary | ICD-10-CM

## 2011-06-07 MED ORDER — VALSARTAN-HYDROCHLOROTHIAZIDE 320-12.5 MG PO TABS: 1.0000 | ORAL_TABLET | Freq: Every day | ORAL | Status: DC

## 2011-06-07 NOTE — Assessment & Plan Note (Addendum)
Poorly controlled.   Patient reports good compliance.   Add HCTZ 12.5 to Diovan 320.  He has not been taking his lasix. BP: 162/88 mmHg  Lab Results  Component Value Date   CREATININE 1.3 03/31/2011   Amlodipine discontinued in the past secondary to torsion the edema.  Bystolic discontinued due to bradycardia.

## 2011-06-07 NOTE — Progress Notes (Signed)
Subjective:    Patient ID: Marcus [name], male    DOB: [birth year], 70 y.o.   MRN: [id number]  Hypertension This is a chronic problem. The current episode started more than 1 year ago. The problem has been gradually worsening since onset. The problem is uncontrolled. Pertinent negatives include no anxiety, chest pain, peripheral edema or shortness of breath. There are no associated agents to hypertension. Risk factors for coronary artery disease include obesity and male gender. Past treatments include angiotensin blockers. There are no compliance problems.       Review of Systems  Respiratory: Negative for shortness of breath.   Cardiovascular: Negative for chest pain.   Past Medical History  Diagnosis Date  . Unspecified vitamin D deficiency   . Calcaneal spur   . Ventral hernia, unspecified, without mention of obstruction or gangrene   . Other and unspecified hyperlipidemia   . External hemorrhoids without mention of complication   . Diverticulosis of colon (without mention of hemorrhage)   . Hiatal hernia   . Elevated prostate specific antigen (PSA)   . Gout, unspecified   . Obstructive sleep apnea (adult) (pediatric)   . Unspecified essential hypertension   . Esophageal reflux   . Personal history of malignant neoplasm of large intestine   . Personal history of colonic polyps 08/21/2009    TUBULAR ADENOMAS  . OSA (obstructive sleep apnea)   . Lumbar spondylolysis   . Vitamin B12 deficiency   . Postgastric surgery syndromes   . Hiatal hernia     History   Social History  . Marital Status: Married    Spouse Name: N/A    Number of Children: N/A  . Years of Education: N/A   Occupational History  . semi retired    Social History Main Topics  . Smoking status: Former Games developer  . Smokeless tobacco: Not on file  . Alcohol Use: No     currently no alcoholic beveraages (on a diet)  . Drug Use: No  . Sexually Active: Not on file   Other Topics Concern  . Not on file     Social History Narrative     MarriedFormer Smoker Alcohol use-yes 1-2 drinks per day   Occupation: Associate Professor       Past Surgical History  Procedure Date  . Colon resection 1986    wih mesh repair  . Vertical banded gastroplasty 1987  . Cataract extraction extracapsular     bilateral  . Knee surgery     left  . Hernia repair   . Back surgery     Family History  Problem Relation Age of Onset  . Stroke Father   . Esophagitis Father     died from perforated esophagus  . Breast cancer Mother   . Heart disease Paternal Grandfather   . Colon cancer Maternal Grandmother     questionable    No Known Allergies  Current Outpatient Prescriptions on File Prior to Visit  Medication Sig Dispense Refill  . allopurinol (ZYLOPRIM) 100 MG tablet Take 1 tablet (100 mg total) by mouth daily.  90 tablet  1  . aspirin 81 MG tablet Take 81 mg by mouth daily.        . cyanocobalamin (,VITAMIN B-12,) 1000 MCG/ML injection Inject 1 mL (1,000 mcg total) into the muscle every 30 (thirty) days.  10 mL  0  . hydrALAZINE (APRESOLINE) 25 MG tablet Take 1 tablet (25 mg total) by mouth 3 (three) times daily.  90 tablet  11  . ipratropium (ATROVENT) 0.03 % nasal spray Place 2 sprays into the nose every 12 (twelve) hours.  30 mL  3  . metFORMIN (GLUCOPHAGE) 500 MG tablet Take 500 mg by mouth 2 (two) times daily with a meal.       . omeprazole (PRILOSEC) 40 MG capsule Take 1 capsule daily by mouth before morning meal  90 capsule  1  . simvastatin (ZOCOR) 20 MG tablet Take 1 tablet (20 mg total) by mouth at bedtime.  90 tablet  1    BP 162/88  Pulse 60  Temp(Src) 98.3 F (36.8 C) (Oral)  Wt 238 lb (107.956 kg)       Objective:   Physical Exam  Constitutional: He appears well-developed and well-nourished.  Cardiovascular: Normal rate, regular rhythm and normal heart sounds.   Pulmonary/Chest: Effort normal and breath sounds normal. He has no wheezes.  Musculoskeletal: He exhibits no edema.   Skin: Skin is warm and dry.       Assessment & Plan:

## 2011-06-07 NOTE — Patient Instructions (Signed)
Please complete the following lab tests before your next follow up appointment: BMET - 401.9 

## 2011-06-25 ENCOUNTER — Encounter: Payer: Self-pay | Admitting: Internal Medicine

## 2011-06-25 ENCOUNTER — Ambulatory Visit (INDEPENDENT_AMBULATORY_CARE_PROVIDER_SITE_OTHER): Payer: Medicare Other | Admitting: Internal Medicine

## 2011-06-25 VITALS — BP 138/70 | HR 84 | Temp 98.4°F | Ht 69.0 in | Wt 235.0 lb

## 2011-06-25 DIAGNOSIS — I10 Essential (primary) hypertension: Secondary | ICD-10-CM

## 2011-06-25 MED ORDER — AMLODIPINE BESYLATE 2.5 MG PO TABS: 2.5000 mg | ORAL_TABLET | Freq: Every day | ORAL | Status: DC

## 2011-06-25 NOTE — Assessment & Plan Note (Signed)
Blood pressure is improved. I suspect elevated readings from automated cuff are not accurate.   Hydralazine is difficult to take 3 times a day. Patient advised to taper off or 5 days. Restart amlodipine 2.5 mg once daily.  Continue diovan/hctz 320 / 12.5 mg once daily.  BP with manual cuff left arm 138/70.  Right arm 136/70.

## 2011-06-25 NOTE — Patient Instructions (Signed)
Please complete the following lab tests within 2 weeks: BMET - 401.9 

## 2011-06-25 NOTE — Progress Notes (Signed)
Subjective:    Patient ID: Marcus [name], male    DOB: [birth year], 70 y.o.   MRN: [id number]  HPI  70 year old white male for hypertension followup. Patient has been monitoring his blood pressure at home. Automated blood pressure cuff readings have ranged from 145 systolic to 200 systolic. At previous visit HCTZ was added to Diovan 320 mg.  Patient has been experiencing intermittent headaches. It is unclear whether headaches are related to elevated blood pressure reading.  Patient reports episode of mild nausea and vomiting last night. He remembers eating a piece of pie before going to bed. Symptoms better this morning.   Review of Systems Negative for chest pain,  Negative for shortness of breath  Past Medical History  Diagnosis Date  . Unspecified vitamin D deficiency   . Calcaneal spur   . Ventral hernia, unspecified, without mention of obstruction or gangrene   . Other and unspecified hyperlipidemia   . External hemorrhoids without mention of complication   . Diverticulosis of colon (without mention of hemorrhage)   . Hiatal hernia   . Elevated prostate specific antigen (PSA)   . Gout, unspecified   . Obstructive sleep apnea (adult) (pediatric)   . Unspecified essential hypertension   . Esophageal reflux   . Personal history of malignant neoplasm of large intestine   . Personal history of colonic polyps 08/21/2009    TUBULAR ADENOMAS  . OSA (obstructive sleep apnea)   . Lumbar spondylolysis   . Vitamin B12 deficiency   . Postgastric surgery syndromes   . Hiatal hernia     History   Social History  . Marital Status: Married    Spouse Name: N/A    Number of Children: N/A  . Years of Education: N/A   Occupational History  . semi retired    Social History Main Topics  . Smoking status: Former Games developer  . Smokeless tobacco: Not on file  . Alcohol Use: No     currently no alcoholic beveraages (on a diet)  . Drug Use: No  . Sexually Active: Not on file   Other  Topics Concern  . Not on file   Social History Narrative     MarriedFormer Smoker Alcohol use-yes 1-2 drinks per day   Occupation: Associate Professor       Past Surgical History  Procedure Date  . Colon resection 1986    wih mesh repair  . Vertical banded gastroplasty 1987  . Cataract extraction extracapsular     bilateral  . Knee surgery     left  . Hernia repair   . Back surgery     Family History  Problem Relation Age of Onset  . Stroke Father   . Esophagitis Father     died from perforated esophagus  . Breast cancer Mother   . Heart disease Paternal Grandfather   . Colon cancer Maternal Grandmother     questionable    No Known Allergies  Current Outpatient Prescriptions on File Prior to Visit  Medication Sig Dispense Refill  . allopurinol (ZYLOPRIM) 100 MG tablet Take 1 tablet (100 mg total) by mouth daily.  90 tablet  1  . aspirin 81 MG tablet Take 81 mg by mouth daily.        . cyanocobalamin (,VITAMIN B-12,) 1000 MCG/ML injection Inject 1 mL (1,000 mcg total) into the muscle every 30 (thirty) days.  10 mL  0  . doxycycline (DORYX) 100 MG DR capsule Take 100 mg by mouth 2 (two)  times daily.        [name] Kitchen ipratropium (ATROVENT) 0.03 % nasal spray Place 2 sprays into the nose every 12 (twelve) hours.  30 mL  3  . metFORMIN (GLUCOPHAGE) 500 MG tablet Take 500 mg by mouth 2 (two) times daily with a meal.       . omeprazole (PRILOSEC) 40 MG capsule Take 1 capsule daily by mouth before morning meal  90 capsule  1  . simvastatin (ZOCOR) 20 MG tablet Take 1 tablet (20 mg total) by mouth at bedtime.  90 tablet  1  . Tamsulosin HCl (FLOMAX) 0.4 MG CAPS Take 0.4 mg by mouth daily.        . valsartan-hydrochlorothiazide (DIOVAN-HCT) 320-12.5 MG per tablet Take 1 tablet by mouth daily.  30 tablet  1    BP 138/70  Pulse 84  Temp(Src) 98.4 F (36.9 C) (Oral)  Ht 5\' 9"  (1.753 m)  Wt 235 lb (106.595 kg)  BMI 34.70 kg/m2     Objective:   Physical Exam   Constitutional: Appears  well-developed and well-nourished. No distress.  Mouth/Throat: Oropharynx is clear and moist.  Cardiovascular: Normal rate, regular rhythm and normal heart sounds.  Exam reveals no gallop and no friction rub.  No murmur heard. Pulmonary/Chest: Effort normal and breath sounds normal.  No wheezes. No rales.  Abdominal: Soft. Bowel sounds are normal. No mass. There is no tenderness.  Musculoskeletal: Trace bilateral lower extremity edema  Skin: Skin is warm and dry.  Psychiatric: Normal mood and affect. Behavior is normal.      Assessment & Plan:

## 2011-07-08 ENCOUNTER — Other Ambulatory Visit: Payer: Self-pay | Admitting: Pain Medicine

## 2011-07-09 ENCOUNTER — Encounter (HOSPITAL_BASED_OUTPATIENT_CLINIC_OR_DEPARTMENT_OTHER): Payer: Self-pay | Admitting: *Deleted

## 2011-07-09 NOTE — Progress Notes (Signed)
NPO AFTER MN WITH EXCEPTION CLEAR LIQUIDS (NO CREAM/ MILK PRODUCTS). [place]ES [place] [address]. NEEDS ISTAT CURRENT EKG W/ CHART. WILL TAKE NORVASC, PRILOSEC, ZOCOR AM OF SURG. W/ SIP OF WATER. REVIEWED [facility] GUIDELINES , WILL BRING MEDS, CURRENTLY PT NOT USING CPAP BECAUSE MASK DOES NOT FIT.

## 2011-07-12 ENCOUNTER — Other Ambulatory Visit (INDEPENDENT_AMBULATORY_CARE_PROVIDER_SITE_OTHER): Payer: Medicare Other

## 2011-07-12 DIAGNOSIS — I10 Essential (primary) hypertension: Secondary | ICD-10-CM

## 2011-07-12 LAB — BASIC METABOLIC PANEL
BUN: 20 mg/dL (ref 6–23)
CO2: 30 mEq/L (ref 19–32)
Calcium: 9.2 mg/dL (ref 8.4–10.5)
Chloride: 106 mEq/L (ref 96–112)
Creatinine, Ser: 1 mg/dL (ref 0.4–1.5)
GFR: 77.27 mL/min (ref >60.00)
Glucose, Bld: 96 mg/dL (ref 70–99)
Potassium: 4.4 mEq/L (ref 3.5–5.1)
Sodium: 142 mEq/L (ref 135–145)

## 2011-07-13 ENCOUNTER — Encounter (HOSPITAL_BASED_OUTPATIENT_CLINIC_OR_DEPARTMENT_OTHER)
Admission: RE | Disposition: A | Discharge status: Home / Self Care | Payer: Self-pay | Source: Ambulatory Visit | Attending: Specialist

## 2011-07-13 ENCOUNTER — Encounter (HOSPITAL_BASED_OUTPATIENT_CLINIC_OR_DEPARTMENT_OTHER): Payer: Self-pay | Admitting: Anesthesiology

## 2011-07-13 ENCOUNTER — Ambulatory Visit (HOSPITAL_BASED_OUTPATIENT_CLINIC_OR_DEPARTMENT_OTHER): Payer: Medicare Other | Admitting: Anesthesiology

## 2011-07-13 ENCOUNTER — Ambulatory Visit (HOSPITAL_BASED_OUTPATIENT_CLINIC_OR_DEPARTMENT_OTHER)
Admission: RE | Admit: 2011-07-13 | Discharge: 2011-07-13 | Disposition: A | Discharge status: Home / Self Care | Payer: Medicare Other | Source: Ambulatory Visit | Attending: Specialist | Admitting: Specialist

## 2011-07-13 ENCOUNTER — Encounter (HOSPITAL_BASED_OUTPATIENT_CLINIC_OR_DEPARTMENT_OTHER): Payer: Self-pay | Admitting: *Deleted

## 2011-07-13 DIAGNOSIS — E559 Vitamin D deficiency, unspecified: Secondary | ICD-10-CM

## 2011-07-13 DIAGNOSIS — M109 Gout, unspecified: Secondary | ICD-10-CM

## 2011-07-13 DIAGNOSIS — R7303 Prediabetes: Secondary | ICD-10-CM

## 2011-07-13 DIAGNOSIS — R972 Elevated prostate specific antigen [PSA]: Secondary | ICD-10-CM

## 2011-07-13 DIAGNOSIS — R109 Unspecified abdominal pain: Secondary | ICD-10-CM

## 2011-07-13 DIAGNOSIS — M7730 Calcaneal spur, unspecified foot: Secondary | ICD-10-CM

## 2011-07-13 DIAGNOSIS — D509 Iron deficiency anemia, unspecified: Secondary | ICD-10-CM

## 2011-07-13 DIAGNOSIS — K449 Diaphragmatic hernia without obstruction or gangrene: Secondary | ICD-10-CM

## 2011-07-13 DIAGNOSIS — E669 Obesity, unspecified: Secondary | ICD-10-CM

## 2011-07-13 DIAGNOSIS — K439 Ventral hernia without obstruction or gangrene: Secondary | ICD-10-CM

## 2011-07-13 DIAGNOSIS — G4733 Obstructive sleep apnea (adult) (pediatric): Secondary | ICD-10-CM

## 2011-07-13 DIAGNOSIS — I2510 Atherosclerotic heart disease of native coronary artery without angina pectoris: Secondary | ICD-10-CM | POA: Insufficient documentation

## 2011-07-13 DIAGNOSIS — K644 Residual hemorrhoidal skin tags: Secondary | ICD-10-CM

## 2011-07-13 DIAGNOSIS — R6889 Other general symptoms and signs: Secondary | ICD-10-CM

## 2011-07-13 DIAGNOSIS — K5730 Diverticulosis of large intestine without perforation or abscess without bleeding: Secondary | ICD-10-CM

## 2011-07-13 DIAGNOSIS — R079 Chest pain, unspecified: Secondary | ICD-10-CM

## 2011-07-13 DIAGNOSIS — Y93A1 Activity, exercise machines primarily for cardiorespiratory conditioning: Secondary | ICD-10-CM | POA: Insufficient documentation

## 2011-07-13 DIAGNOSIS — R05 Cough: Secondary | ICD-10-CM

## 2011-07-13 DIAGNOSIS — Z860100 Personal history of colon polyps, unspecified: Secondary | ICD-10-CM

## 2011-07-13 DIAGNOSIS — M2240 Chondromalacia patellae, unspecified knee: Secondary | ICD-10-CM | POA: Insufficient documentation

## 2011-07-13 DIAGNOSIS — X58XXXA Exposure to other specified factors, initial encounter: Secondary | ICD-10-CM | POA: Insufficient documentation

## 2011-07-13 DIAGNOSIS — R222 Localized swelling, mass and lump, trunk: Secondary | ICD-10-CM

## 2011-07-13 DIAGNOSIS — E119 Type 2 diabetes mellitus without complications: Secondary | ICD-10-CM | POA: Insufficient documentation

## 2011-07-13 DIAGNOSIS — IMO0002 Reserved for concepts with insufficient information to code with codable children: Secondary | ICD-10-CM | POA: Insufficient documentation

## 2011-07-13 DIAGNOSIS — K219 Gastro-esophageal reflux disease without esophagitis: Secondary | ICD-10-CM

## 2011-07-13 DIAGNOSIS — R202 Paresthesia of skin: Secondary | ICD-10-CM

## 2011-07-13 DIAGNOSIS — M9420 Chondromalacia, unspecified site: Secondary | ICD-10-CM | POA: Insufficient documentation

## 2011-07-13 DIAGNOSIS — I10 Essential (primary) hypertension: Secondary | ICD-10-CM

## 2011-07-13 DIAGNOSIS — E785 Hyperlipidemia, unspecified: Secondary | ICD-10-CM

## 2011-07-13 DIAGNOSIS — Z85038 Personal history of other malignant neoplasm of large intestine: Secondary | ICD-10-CM

## 2011-07-13 DIAGNOSIS — Z85048 Personal history of other malignant neoplasm of rectum, rectosigmoid junction, and anus: Secondary | ICD-10-CM

## 2011-07-13 DIAGNOSIS — Z79899 Other long term (current) drug therapy: Secondary | ICD-10-CM | POA: Insufficient documentation

## 2011-07-13 DIAGNOSIS — L659 Nonscarring hair loss, unspecified: Secondary | ICD-10-CM

## 2011-07-13 DIAGNOSIS — G4700 Insomnia, unspecified: Secondary | ICD-10-CM

## 2011-07-13 DIAGNOSIS — M1710 Unilateral primary osteoarthritis, unspecified knee: Secondary | ICD-10-CM | POA: Insufficient documentation

## 2011-07-13 DIAGNOSIS — R059 Cough, unspecified: Secondary | ICD-10-CM

## 2011-07-13 DIAGNOSIS — R001 Bradycardia, unspecified: Secondary | ICD-10-CM

## 2011-07-13 HISTORY — PX: KNEE ARTHROSCOPY: SHX127

## 2011-07-13 HISTORY — DX: Localized edema: R60.0

## 2011-07-13 HISTORY — DX: Unspecified osteoarthritis, unspecified site: M19.90

## 2011-07-13 HISTORY — DX: Abnormal findings on diagnostic imaging of heart and coronary circulation: R93.1

## 2011-07-13 HISTORY — DX: Unspecified tear of unspecified meniscus, current injury, unspecified knee, initial encounter: S83.209A

## 2011-07-13 HISTORY — DX: Frequency of micturition: R35.0

## 2011-07-13 HISTORY — DX: Reserved for inherently not codable concepts without codable children: IMO0001

## 2011-07-13 HISTORY — DX: Atherosclerotic heart disease of native coronary artery without angina pectoris: I25.10

## 2011-07-13 LAB — POCT I-STAT 4, (NA,K, GLUC, HGB,HCT)
Glucose, Bld: 87 mg/dL (ref 70–99)
HCT: 38 % — ABNORMAL LOW (ref 39.0–52.0)
Hemoglobin: 12.9 g/dL — ABNORMAL LOW (ref 13.0–17.0)
Potassium: 4.2 mEq/L (ref 3.5–5.1)
Sodium: 142 mEq/L (ref 135–145)

## 2011-07-13 SURGERY — ARTHROSCOPY, KNEE
Anesthesia: Monitor Anesthesia Care | Site: Knee | Laterality: Left | Wound class: Clean

## 2011-07-13 MED ORDER — BUPIVACAINE HCL (PF) 0.25 % IJ SOLN
INTRAMUSCULAR | Status: DC | PRN
  Administered 2011-07-13: 20 mL

## 2011-07-13 MED ORDER — CEFAZOLIN SODIUM-DEXTROSE 2-3 GM-% IV SOLR
2.0000 g | INTRAVENOUS | Status: AC
  Administered 2011-07-13: 2 g via INTRAVENOUS

## 2011-07-13 MED ORDER — METHOCARBAMOL 500 MG PO TABS: 500.0000 mg | ORAL_TABLET | Freq: Four times a day (QID) | ORAL | Status: AC | PRN

## 2011-07-13 MED ORDER — POVIDONE-IODINE 7.5 % EX SOLN: Freq: Once | CUTANEOUS | Status: DC

## 2011-07-13 MED ORDER — LACTATED RINGERS IV SOLN
INTRAVENOUS | Status: DC
  Administered 2011-07-13: 100 mL/h via INTRAVENOUS

## 2011-07-13 MED ORDER — HYDROCODONE-ACETAMINOPHEN 5-325 MG PO TABS: 1.0000 | ORAL_TABLET | ORAL | Status: AC | PRN

## 2011-07-13 MED ORDER — FENTANYL CITRATE 0.05 MG/ML IJ SOLN
INTRAMUSCULAR | Status: DC | PRN
  Administered 2011-07-13: 25 ug via INTRAVENOUS

## 2011-07-13 MED ORDER — FENTANYL CITRATE 0.05 MG/ML IJ SOLN
50.0000 ug | INTRAMUSCULAR | Status: DC | PRN
  Administered 2011-07-13: 50 ug via INTRAVENOUS
  Administered 2011-07-13: 25 ug via INTRAVENOUS

## 2011-07-13 MED ORDER — MIDAZOLAM HCL 2 MG/2ML IJ SOLN
2.0000 mg | INTRAMUSCULAR | Status: DC | PRN
  Administered 2011-07-13: 1 mg via INTRAVENOUS

## 2011-07-13 MED ORDER — MORPHINE SULFATE 4 MG/ML IJ SOLN
INTRAMUSCULAR | Status: DC | PRN
  Administered 2011-07-13: 4 mg via INTRAVENOUS

## 2011-07-13 MED ORDER — ONDANSETRON HCL 4 MG/2ML IJ SOLN
INTRAMUSCULAR | Status: DC | PRN
  Administered 2011-07-13 (×4): 1 mg via INTRAVENOUS

## 2011-07-13 MED ORDER — ROPIVACAINE HCL 5 MG/ML IJ SOLN
INTRAMUSCULAR | Status: DC | PRN
  Administered 2011-07-13: 30 mL

## 2011-07-13 MED ORDER — PROPOFOL 10 MG/ML IV EMUL
INTRAVENOUS | Status: DC | PRN
  Administered 2011-07-13: 50 mg via INTRAVENOUS

## 2011-07-13 MED ORDER — HYDROCODONE-ACETAMINOPHEN 5-325 MG PO TABS
1.0000 | ORAL_TABLET | Freq: Four times a day (QID) | ORAL | Status: DC | PRN
  Administered 2011-07-13: 1 via ORAL

## 2011-07-13 MED ORDER — CEPHALEXIN 500 MG PO CAPS: 500.0000 mg | ORAL_CAPSULE | Freq: Three times a day (TID) | ORAL | Status: DC

## 2011-07-13 MED ORDER — CEFAZOLIN SODIUM 1-5 GM-% IV SOLN: 1.0000 g | INTRAVENOUS | Status: DC

## 2011-07-13 MED ORDER — LIDOCAINE HCL (CARDIAC) 20 MG/ML IV SOLN
INTRAVENOUS | Status: DC | PRN
  Administered 2011-07-13: 60 mg via INTRAVENOUS

## 2011-07-13 MED ORDER — PROPOFOL 10 MG/ML IV EMUL
INTRAVENOUS | Status: DC | PRN
  Administered 2011-07-13: 120 ug/kg/min via INTRAVENOUS

## 2011-07-13 MED ORDER — CHLORHEXIDINE GLUCONATE 4 % EX LIQD: 60.0000 mL | Freq: Once | CUTANEOUS | Status: DC

## 2011-07-13 MED ORDER — SODIUM CHLORIDE 0.9 % IV SOLN: INTRAVENOUS | Status: DC

## 2011-07-13 MED ORDER — SODIUM CHLORIDE 0.9 % IR SOLN
Status: DC | PRN
  Administered 2011-07-13: 3000 mL

## 2011-07-13 SURGICAL SUPPLY — 47 items
BANDAGE ELASTIC 6 VELCRO ST LF (GAUZE/BANDAGES/DRESSINGS) ×1 IMPLANT
BANDAGE ESMARK 6X9 LF (GAUZE/BANDAGES/DRESSINGS) IMPLANT
BANDAGE GAUZE ELAST BULKY 4 IN (GAUZE/BANDAGES/DRESSINGS) ×4 IMPLANT
BLADE 4.2CUDA (BLADE) IMPLANT
BLADE CUDA GRT WHITE 3.5 (BLADE) ×2 IMPLANT
BLADE CUDA SHAVER 3.5 (BLADE) ×2 IMPLANT
BNDG CMPR 9X6 STRL LF SNTH (GAUZE/BANDAGES/DRESSINGS)
BNDG ESMARK 6X9 LF (GAUZE/BANDAGES/DRESSINGS)
CANISTER SUCT LVC 12 LTR MEDI- (MISCELLANEOUS) ×4 IMPLANT
CANISTER SUCTION 1200CC (MISCELLANEOUS) ×2 IMPLANT
CLOTH BEACON ORANGE TIMEOUT ST (SAFETY) ×2 IMPLANT
DRAPE ARTHROSCOPY W/POUCH 114 (DRAPES) ×2 IMPLANT
DRAPE INCISE 23X17 IOBAN STRL (DRAPES) ×1
DRAPE INCISE IOBAN 23X17 STRL (DRAPES) ×1 IMPLANT
DRSG PAD ABDOMINAL 8X10 ST (GAUZE/BANDAGES/DRESSINGS) ×2 IMPLANT
DURAPREP 26ML APPLICATOR (WOUND CARE) ×2 IMPLANT
ELECT MENISCUS 165MM 90D (ELECTRODE) IMPLANT
ELECT REM PT RETURN 9FT ADLT (ELECTROSURGICAL)
ELECTRODE REM PT RTRN 9FT ADLT (ELECTROSURGICAL) IMPLANT
GAUZE KERLIX 2  STERILE LF (GAUZE/BANDAGES/DRESSINGS) ×2 IMPLANT
GAUZE XEROFORM 1X8 LF (GAUZE/BANDAGES/DRESSINGS) ×2 IMPLANT
GLOVE ECLIPSE 6.5 STRL STRAW (GLOVE) ×1 IMPLANT
GLOVE INDICATOR 6.5 STRL GRN (GLOVE) ×2 IMPLANT
GLOVE INDICATOR 8.0 STRL GRN (GLOVE) ×4 IMPLANT
GLOVE SURG ORTHO 8.0 STRL STRW (GLOVE) ×4 IMPLANT
GOWN BRE IMP SLV AUR LG STRL (GOWN DISPOSABLE) ×1 IMPLANT
GOWN PREVENTION PLUS LG XLONG (DISPOSABLE) ×2 IMPLANT
GOWN STRL REIN XL XLG (GOWN DISPOSABLE) ×2 IMPLANT
IMMOBILIZER KNEE 22 UNIV (SOFTGOODS) IMPLANT
IMMOBILIZER KNEE 24 THIGH 36 (MISCELLANEOUS) IMPLANT
IMMOBILIZER KNEE 24 UNIV (MISCELLANEOUS)
KNEE WRAP E Z 3 GEL PACK (MISCELLANEOUS) ×2 IMPLANT
MINI VAC (SURGICAL WAND) ×2 IMPLANT
PACK ARTHROSCOPY DSU (CUSTOM PROCEDURE TRAY) ×2 IMPLANT
PACK BASIN DAY SURGERY FS (CUSTOM PROCEDURE TRAY) ×2 IMPLANT
PADDING CAST ABS 4INX4YD NS (CAST SUPPLIES) ×1
PADDING CAST ABS COTTON 4X4 ST (CAST SUPPLIES) ×1 IMPLANT
PENCIL BUTTON HOLSTER BLD 10FT (ELECTRODE) IMPLANT
SET ARTHROSCOPY TUBING (MISCELLANEOUS) ×2
SET ARTHROSCOPY TUBING LN (MISCELLANEOUS) ×1 IMPLANT
SPONGE GAUZE 4X4 12PLY (GAUZE/BANDAGES/DRESSINGS) ×2 IMPLANT
SUT ETHILON 3 0 FSL (SUTURE) ×2 IMPLANT
SYR CONTROL 10ML LL (SYRINGE) ×2 IMPLANT
TOWEL OR 17X24 6PK STRL BLUE (TOWEL DISPOSABLE) ×2 IMPLANT
WAND 30 DEG SABER W/CORD (SURGICAL WAND) IMPLANT
WAND 90 DEG TURBOVAC W/CORD (SURGICAL WAND) IMPLANT
WATER STERILE IRR 500ML POUR (IV SOLUTION) ×2 IMPLANT

## 2011-07-13 NOTE — Op Note (Signed)
  Dictated [id number]

## 2011-07-13 NOTE — Progress Notes (Signed)
In position for left knee

## 2011-07-13 NOTE — Transfer of Care (Signed)
Immediate Anesthesia Transfer of Care Note  Patient: Marcus [name]  Procedure(s) Performed:  ARTHROSCOPY KNEE - partial menisectomy with chondrylplasty  Patient Location: PACU  Anesthesia Type: MAC  Level of Consciousness: awake, drowsy  and oriented  Airway & Oxygen Therapy: Patient Spontanous Breathing and Patient connected to face mask oxygen  Post-op Assessment: Report given to PACU RN and Post -op Vital signs reviewed and stable  Post vital signs: Reviewed and stable  Complications: No apparent anesthesia complications

## 2011-07-13 NOTE — H&P (Signed)
Marcus [name] is an 71 y.o. male.   Chief Complaint: left knee pain HPI: 71 year old gentleman injured his knee while on a treadmill. Evaluation finds that he does have a left                                                        knee medial meniscal tear is invalid weeks from injury he does have pain swelling catching and popping  Past Medical History  Diagnosis Date  . Unspecified vitamin D deficiency   . Calcaneal spur   . Ventral hernia, unspecified, without mention of obstruction or gangrene   . Other and unspecified hyperlipidemia   . External hemorrhoids without mention of complication   . Diverticulosis of colon (without mention of hemorrhage)   . Elevated prostate specific antigen (PSA)   . Gout, unspecified per pt stable  . Unspecified essential hypertension   . Personal history of malignant neoplasm of large intestine   . Personal history of colonic polyps 08/21/2009    TUBULAR ADENOMAS  . Lumbar spondylolysis   . Vitamin B12 deficiency   . Postgastric surgery syndromes   . Coronary artery disease cardiologist- dr Marcus [name]--  visit 01-08-11 in epic  . Normal nuclear stress test 04-23-2010    ef 51%,  septal hypokinesis, normal perfusion  . Echocardiogram findings abnormal, without diagnosis 04-23-2010    normal LV function, mod. left atrial enlargement, mild right artrial enlargement and grade 1 diastolic dysfunction  . Edema of lower extremity     ankles, elevates legs  . OSA (obstructive sleep apnea) no cpap use due to recent lost 50 lbs  and mask does not fit   . Hiatal hernia   . Hernia, diaphragmatic, without obstruction   . Esophageal reflux controlled w/ prilosec  . Arthritis     generalized  . Acute meniscal tear of knee left  . Frequency of urination     followed by dr Marcus [name]  . Diabetes mellitus oral med    Past Surgical History  Procedure Date  . Vertical banded gastroplasty 1986  . Knee surgery     left  . Shoulder arthroscopy 05-23-2007   RIGHT  . Laminectomy and microdiscectomy lumbar spine MARCH  2008    L3 -  4  . Knee arthroscopy 2007    RIGHT  . Sigmoid colectomy for cancer 1989  . Cataract extraction w/ intraocular lens  implant, bilateral   . Laparoscopic incisional / umbilical / ventral hernia repair 2006    Family History  Problem Relation Age of Onset  . Stroke Father   . Esophagitis Father     died from perforated esophagus  . Breast cancer Mother   . Heart disease Paternal Grandfather   . Colon cancer Maternal Grandmother     questionable   Social History:  reports that he quit smoking about 31 years ago. He has never used smokeless tobacco. He reports that he does not drink alcohol or use illicit drugs.  Allergies:  Allergies  Allergen Reactions  . Contrast Media (Iodinated Diagnostic Agents) Palpitations    TACHYCARDIA    Medications Prior to Admission  Medication Dose Route Frequency Provider Last Rate Last Dose  . 0.9 %  sodium chloride infusion   Intravenous Continuous Marcus Rushing, PA      . ceFAZolin (  ANCEF) IVPB 2 g/50 mL premix  2 g Intravenous 60 min Pre-Op Erasmo [name]      . chlorhexidine (HIBICLENS) 4 % liquid 4 application  60 mL Topical Once Marcus [name], Marcus [name]      . fentaNYL (SUBLIMAZE) injection 50 mcg  50 mcg Intravenous Q1H PRN Marcus Der, MD   25 mcg at 07/13/11 1359  . lactated ringers infusion   Intravenous Continuous Marcus Side, MD 100 mL/hr at 07/13/11 1330 100 mL/hr at 07/13/11 1330  . midazolam (VERSED) injection 2 mg  2 mg Intravenous Q1H PRN Marcus Der, MD   1 mg at 07/13/11 1356  . povidone-iodine (BETADINE) 7.5 % scrub   Topical Once Marcus [name], Marcus [name]      . DISCONTD: ceFAZolin (ANCEF) IVPB 1 g/50 mL premix  1 g Intravenous 60 min Pre-Op Marcus Rushing, PA       Medications Prior to Admission  Medication Sig Dispense Refill  . allopurinol (ZYLOPRIM) 100 MG tablet Take 1 tablet (100 mg total) by mouth daily.  90 tablet  1  . cyanocobalamin  (,VITAMIN B-12,) 1000 MCG/ML injection Inject 1 mL (1,000 mcg total) into the muscle every 30 (thirty) days.  10 mL  0  . ipratropium (ATROVENT) 0.03 % nasal spray Place 2 sprays into the nose every evening.        . metFORMIN (GLUCOPHAGE) 500 MG tablet Take 500 mg by mouth 2 (two) times daily with a meal.       . omeprazole (PRILOSEC) 40 MG capsule Take 40 mg by mouth every morning. Take 1 capsule daily by mouth before morning meal       . simvastatin (ZOCOR) 20 MG tablet Take 20 mg by mouth every morning.        . traMADol (ULTRAM) 50 MG tablet Take 50 mg by mouth every 6 (six) hours as needed.        . valsartan-hydrochlorothiazide (DIOVAN-HCT) 320-12.5 MG per tablet Take 1 tablet by mouth daily.  30 tablet  1  . amLODipine (NORVASC) 2.5 MG tablet Take 2.5 mg by mouth every morning.        [name] Kitchen aspirin 81 MG tablet Take 81 mg by mouth daily.         Results for orders placed during the hospital encounter of 07/13/11 (from the past 48 hour(s))  POCT I-STAT 4, (NA,K, GLUC, HGB,HCT)     Status: Abnormal   Collection Time   07/13/11  1:30 PM      Component Value Range Comment   Sodium 142  135 - 145 (mEq/L)    Potassium 4.2  3.5 - 5.1 (mEq/L)    Glucose, Bld 87  70 - 99 (mg/dL)    HCT 16.1 (*) 09.6 - 52.0 (%)    Hemoglobin 12.9 (*) 13.0 - 17.0 (g/dL)    No results found.  Review of Systems  Constitutional: Negative.   HENT: Negative.   Eyes: Negative.   Respiratory: Negative.   Cardiovascular: Negative.   Gastrointestinal: Negative.   Genitourinary: Negative.   Musculoskeletal: Positive for joint pain.  Skin: Negative.     Blood pressure 162/65, pulse 53, temperature 97.8 F (36.6 C), temperature source Oral, resp. rate 12, height 5\' 10"  (1.778 m), weight 102.059 kg (225 lb), SpO2 100.00%. Physical Exam  patient's conscious alert and appropriate appears to be in no distress. Lungs clear heart regular rate and rhythm abdomen soft nontender bowel sounds present. His left knee is  prepped about anesthesia for her knee block. Bilateral lower extremities neuromotor vascularly intact  Assessment/Plan Left knee pain with MRI-positive meniscal tear Plan knee arthroscopy with meniscal debridement  Marcus [name] Marcus [name] 07/13/2011, 2:36 PM   I have seen and examined this patient.  Agree with the note above.  Deandra Gadson Marcus [name] 07/13/2011 2:37 PM

## 2011-07-13 NOTE — Progress Notes (Signed)
Wallet and cell phone  given to wife per pt

## 2011-07-13 NOTE — H&P (Signed)
Marcus [name] is an 71 y.o. male.   Chief Complaint: left knee pain HPI: 71 year old gentleman injured his knee while on a treadmill. Evaluation finds that he does have a left                                                        knee medial meniscal tear is invalid weeks from injury he does have pain swelling catching and popping  Past Medical History  Diagnosis Date  . Unspecified vitamin D deficiency   . Calcaneal spur   . Ventral hernia, unspecified, without mention of obstruction or gangrene   . Other and unspecified hyperlipidemia   . External hemorrhoids without mention of complication   . Diverticulosis of colon (without mention of hemorrhage)   . Elevated prostate specific antigen (PSA)   . Gout, unspecified per pt stable  . Unspecified essential hypertension   . Personal history of malignant neoplasm of large intestine   . Personal history of colonic polyps 08/21/2009    TUBULAR ADENOMAS  . Lumbar spondylolysis   . Vitamin B12 deficiency   . Postgastric surgery syndromes   . Coronary artery disease cardiologist- dr Marcus [name]--  visit 01-08-11 in epic  . Normal nuclear stress test 04-23-2010    ef 51%,  septal hypokinesis, normal perfusion  . Echocardiogram findings abnormal, without diagnosis 04-23-2010    normal LV function, mod. left atrial enlargement, mild right artrial enlargement and grade 1 diastolic dysfunction  . Edema of lower extremity     ankles, elevates legs  . OSA (obstructive sleep apnea) no cpap use due to recent lost 50 lbs  and mask does not fit   . Hiatal hernia   . Hernia, diaphragmatic, without obstruction   . Esophageal reflux controlled w/ prilosec  . Arthritis     generalized  . Acute meniscal tear of knee left  . Frequency of urination     followed by dr dalhstedt  . Diabetes mellitus oral med    Past Surgical History  Procedure Date  . Vertical banded gastroplasty 1986  . Knee surgery     left  . Shoulder arthroscopy 05-23-2007   RIGHT  . Laminectomy and microdiscectomy lumbar spine MARCH  2008    L3 -  4  . Knee arthroscopy 2007    RIGHT  . Sigmoid colectomy for cancer 1989  . Cataract extraction w/ intraocular lens  implant, bilateral   . Laparoscopic incisional / umbilical / ventral hernia repair 2006    Family History  Problem Relation Age of Onset  . Stroke Father   . Esophagitis Father     died from perforated esophagus  . Breast cancer Mother   . Heart disease Paternal Grandfather   . Colon cancer Maternal Grandmother     questionable   Social History:  reports that he quit smoking about 31 years ago. He has never used smokeless tobacco. He reports that he does not drink alcohol or use illicit drugs.  Allergies:  Allergies  Allergen Reactions  . Contrast Media (Iodinated Diagnostic Agents) Palpitations    TACHYCARDIA    Medications Prior to Admission  Medication Dose Route Frequency Provider Last Rate Last Dose  . 0.9 %  sodium chloride infusion   Intravenous Continuous Marcus Rushing, PA      . ceFAZolin (  ANCEF) IVPB 2 g/50 mL premix  2 g Intravenous 60 min Pre-Op Erasmo [name]      . chlorhexidine (HIBICLENS) 4 % liquid 4 application  60 mL Topical Once Marcus [name], Marcus [name]      . fentaNYL (SUBLIMAZE) injection 50 mcg  50 mcg Intravenous Q1H PRN Marcus Der, MD   25 mcg at 07/13/11 1359  . lactated ringers infusion   Intravenous Continuous Marcus Side, MD 100 mL/hr at 07/13/11 1330 100 mL/hr at 07/13/11 1330  . midazolam (VERSED) injection 2 mg  2 mg Intravenous Q1H PRN Marcus Der, MD   1 mg at 07/13/11 1356  . povidone-iodine (BETADINE) 7.5 % scrub   Topical Once Marcus [name], Marcus [name]      . DISCONTD: ceFAZolin (ANCEF) IVPB 1 g/50 mL premix  1 g Intravenous 60 min Pre-Op Marcus Rushing, PA       Medications Prior to Admission  Medication Sig Dispense Refill  . allopurinol (ZYLOPRIM) 100 MG tablet Take 1 tablet (100 mg total) by mouth daily.  90 tablet  1  . cyanocobalamin  (,VITAMIN B-12,) 1000 MCG/ML injection Inject 1 mL (1,000 mcg total) into the muscle every 30 (thirty) days.  10 mL  0  . ipratropium (ATROVENT) 0.03 % nasal spray Place 2 sprays into the nose every evening.        . metFORMIN (GLUCOPHAGE) 500 MG tablet Take 500 mg by mouth 2 (two) times daily with a meal.       . omeprazole (PRILOSEC) 40 MG capsule Take 40 mg by mouth every morning. Take 1 capsule daily by mouth before morning meal       . simvastatin (ZOCOR) 20 MG tablet Take 20 mg by mouth every morning.        . traMADol (ULTRAM) 50 MG tablet Take 50 mg by mouth every 6 (six) hours as needed.        . valsartan-hydrochlorothiazide (DIOVAN-HCT) 320-12.5 MG per tablet Take 1 tablet by mouth daily.  30 tablet  1  . amLODipine (NORVASC) 2.5 MG tablet Take 2.5 mg by mouth every morning.        [name] Kitchen aspirin 81 MG tablet Take 81 mg by mouth daily.         Results for orders placed during the hospital encounter of 07/13/11 (from the past 48 hour(s))  POCT I-STAT 4, (NA,K, GLUC, HGB,HCT)     Status: Abnormal   Collection Time   07/13/11  1:30 PM      Component Value Range Comment   Sodium 142  135 - 145 (mEq/L)    Potassium 4.2  3.5 - 5.1 (mEq/L)    Glucose, Bld 87  70 - 99 (mg/dL)    HCT 40.9 (*) 81.1 - 52.0 (%)    Hemoglobin 12.9 (*) 13.0 - 17.0 (g/dL)    No results found.  Review of Systems  Constitutional: Negative.   HENT: Negative.   Eyes: Negative.   Respiratory: Negative.   Cardiovascular: Negative.   Gastrointestinal: Negative.   Genitourinary: Negative.   Musculoskeletal: Positive for joint pain.  Skin: Negative.     Blood pressure 171/68, pulse 53, temperature 97.8 F (36.6 C), temperature source Oral, resp. rate 14, height 5\' 10"  (1.778 m), weight 102.059 kg (225 lb), SpO2 100.00%. Physical Exam patient's conscious alert and appropriate appears to be in no distress. Lungs clear heart regular rate and rhythm abdomen soft nontender bowel sounds present. His left knee is prepped  about anesthesia for her knee block. Bilateral lower extremities neuromotor vascularly intact  Assessment/Plan Left knee pain with MRI-positive meniscal tear Plan knee arthroscopy with meniscal debridement  Marcus [name] W 07/13/2011, 2:03 PM

## 2011-07-13 NOTE — Anesthesia Preprocedure Evaluation (Signed)
Anesthesia Evaluation  Patient identified by MRN, date of birth, ID band Patient awake    Reviewed: Allergy & Precautions, H&P , NPO status , Patient's Chart, lab work & pertinent test results  Airway Mallampati: II TM Distance: >3 FB Neck ROM: Full    Dental No notable dental hx.    Pulmonary neg pulmonary ROS, sleep apnea ,  clear to auscultation  Pulmonary exam normal       Cardiovascular hypertension, Pt. on medications + CAD and neg cardio ROS Regular Normal    Neuro/Psych  Neuromuscular disease Negative Neurological ROS  Negative Psych ROS   GI/Hepatic negative GI ROS, Neg liver ROS, hiatal hernia, GERD-  ,  Endo/Other  Negative Endocrine ROSDiabetes mellitus-, Type 2, Oral Hypoglycemic Agents  Renal/GU negative Renal ROS  Genitourinary negative   Musculoskeletal negative musculoskeletal ROS (+)   Abdominal   Peds negative pediatric ROS (+)  Hematology negative hematology ROS (+)   Anesthesia Other Findings   Reproductive/Obstetrics negative OB ROS                           Anesthesia Physical Anesthesia Plan  ASA: III  Anesthesia Plan: MAC   Post-op Pain Management:    Induction: Intravenous  Airway Management Planned: Mask  Additional Equipment:   Intra-op Plan:   Post-operative Plan: Extubation in OR  Informed Consent: I have reviewed the patients History and Physical, chart, labs and discussed the procedure including the risks, benefits and alternatives for the proposed anesthesia with the patient or authorized representative who has indicated his/her understanding and acceptance.   Dental advisory given  Plan Discussed with: CRNA  Anesthesia Plan Comments:         Anesthesia Quick Evaluation

## 2011-07-13 NOTE — Anesthesia Postprocedure Evaluation (Signed)
  Anesthesia Post-op Note  Patient: Marcus [name]  Procedure(s) Performed:  ARTHROSCOPY KNEE - partial menisectomy with chondrylplasty  Patient Location: PACU  Anesthesia Type: MAC  Level of Consciousness: awake and alert   Airway and Oxygen Therapy: Patient Spontanous Breathing  Post-op Pain: mild  Post-op Assessment: Post-op Vital signs reviewed, Patient's Cardiovascular Status Stable, Respiratory Function Stable, Patent Airway and No signs of Nausea or vomiting  Post-op Vital Signs: stable  Complications: No apparent anesthesia complications

## 2011-07-13 NOTE — Anesthesia Procedure Notes (Addendum)
Anesthesia Regional Block:    Pre-Anesthetic Checklist: ,, timeout performed, Correct Patient, Correct Site, Correct Laterality, Correct Procedure, Correct Position, site marked, Risks and benefits discussed,  Surgical consent,  Pre-op evaluation,  At surgeon's request and post-op pain management  Laterality: Left  Prep: chloraprep       Needles:  Injection technique: Single-shot      Additional Needles:  Narrative:  Injection made incrementally with aspirations every 5 mL.  Performed by: Personally  Anesthesiologist: denenny  Additional Notes: Risks, benefits and alternative to block explained extensively. Left knee infrapatellar block with Ropivacaine after IV sedation.  Patient tolerated procedure well, without complications.  Supraclavicular block

## 2011-07-14 ENCOUNTER — Encounter (HOSPITAL_BASED_OUTPATIENT_CLINIC_OR_DEPARTMENT_OTHER): Payer: Self-pay | Admitting: Specialist

## 2011-07-14 NOTE — Op Note (Signed)
[name], Marcus [name] NO.:  [id number]  MEDICAL RECORD NO.:  [id number]  LOCATION:                                 FACILITY:  PHYSICIAN:  Erasmo Leventhal, M.D.DATE OF BIRTH:  1941/05/17  DATE OF PROCEDURE:  07/13/2011 DATE OF DISCHARGE:                              OPERATIVE REPORT   PREOPERATIVE DIAGNOSES:  Left knee torn medial meniscus, osteoarthritis.  POSTOPERATIVE DIAGNOSES:  Left knee complex tear medial meniscus, osteoarthritis with grade 3-4 chondromalacia medial compartment extensive, grade 3 extensive patellofemoral joint, and mild lateral compartment.  PROCEDURE:  Left knee arthroscopic partial medial meniscectomy, chondroplasty medial compartment, chondroplasty patellofemoral joint.  SURGEON:  Erasmo Leventhal, M.D.  ASSISTANT:  Jamelle Rushing, P.A.-C.  ANESTHESIA:  Femoral with knee block with monitored anesthesia care.  ESTIMATED BLOOD LOSS:  Less than 10 cc.  DRAINS:  None.  COMPLICATIONS:  None.  TOURNIQUET TIME:  None.  DISPOSITION:  PACU, stable.  OPERATIVE DETAILS:  The patient and family were counseled in the holding area, the correct side was identified, marked and signed appropriately. IV was started.  IV sedation was given, block administered, chart reviewed and signed appropriately.  On the way to the operating room, IV Ancef was given.  TED hose had been applied to the involved leg.  In the OR, he was placed in the supine position under monitored anesthesia care and placed in thigh holder prepped with DuraPrep and draped in sterile fashion .  Standard time-out was done, confirmed by all involved in the room.  Portals were established, proximal medial, inferomedial, and inferolateral.  Diagnostic arthroscopy was undertaken. Patellofemoral joint revealed normal tracking, but there was very tight suprapatellar plaquing that was resected.  Copious synovitis was __________.  Grade 3 chondromalacia  suprapatellofemoral joint __________ stable base.  ACL and PCL were intact, although there was mild widening of the ACL, there was no obvious ganglion.  Intracondylar notch showed osteophytes.  Medial compartment inspected, grade 3 and 4 chondromalacia __________ stable periphery at the edges, that were unstable and a large complex tear medial meniscal with a posterior pedunculated flap which was debrided by  shaver baskets and smoothed down with the Mini-Vac system.  Medial gutter was debrided with a Mini-Vac repair.  Lateral side inspected.  Lateral meniscus showed very minimal chondromalacia. Lateral meniscal appeared to be intact.  There were no other abnormalities noted.  Knee was irrigated and arthroscopic equipment was removed.  10 cc of 0.5% Sensorcaine was placed in the skin edges and 10 cc of 0.5% Sensorcaine with 4 mL morphine sulfate was injected into the knee joint.  Sterile dressing was applied, TED hose and ice pack.  No complications.  He was awakened, taken out of the operating room to the PACU in stable condition.  No complications or problems.  I discussed with Dr. __________ who be stabilized in PACU and discharged to home.          ______________________________ Erasmo Leventhal, M.D.     RAC/MEDQ  D:  07/13/2011  T:  07/14/2011  Job:  [id number]

## 2011-07-14 NOTE — Progress Notes (Signed)
Pt states during post-op phone call that he had an episode of bleeding last night around 2100, where 4x4's and abd pads were soaked through.  Wife is a Engineer, civil (consulting), changed dressing and no further incident since then.  Pt encouraged to call physician if the occurs again.

## 2011-07-16 ENCOUNTER — Other Ambulatory Visit: Payer: Medicare Other

## 2011-07-19 ENCOUNTER — Ambulatory Visit: Payer: Medicare Other | Admitting: Internal Medicine

## 2011-07-23 ENCOUNTER — Ambulatory Visit (INDEPENDENT_AMBULATORY_CARE_PROVIDER_SITE_OTHER): Payer: Medicare Other | Admitting: Internal Medicine

## 2011-07-23 ENCOUNTER — Encounter: Payer: Self-pay | Admitting: Internal Medicine

## 2011-07-23 ENCOUNTER — Telehealth: Payer: Self-pay | Admitting: Internal Medicine

## 2011-07-23 VITALS — BP 134/80 | HR 72 | Temp 98.4°F | Wt 233.0 lb

## 2011-07-23 DIAGNOSIS — I10 Essential (primary) hypertension: Secondary | ICD-10-CM

## 2011-07-23 DIAGNOSIS — L989 Disorder of the skin and subcutaneous tissue, unspecified: Secondary | ICD-10-CM

## 2011-07-23 HISTORY — DX: Disorder of the skin and subcutaneous tissue, unspecified: L98.9

## 2011-07-23 MED ORDER — VALSARTAN-HYDROCHLOROTHIAZIDE 320-12.5 MG PO TABS: 1.0000 | ORAL_TABLET | Freq: Every day | ORAL | Status: DC

## 2011-07-23 MED ORDER — ALLOPURINOL 100 MG PO TABS: 100.0000 mg | ORAL_TABLET | Freq: Every day | ORAL | Status: DC

## 2011-07-23 MED ORDER — AMLODIPINE BESYLATE 5 MG PO TABS: 5.0000 mg | ORAL_TABLET | Freq: Every day | ORAL | Status: DC

## 2011-07-23 NOTE — Patient Instructions (Signed)
Please complete the following lab tests before your next follow up appointment: BMET - 401.9 A1c - 790.29 Our office will contact you re:  Dermatology referral

## 2011-07-23 NOTE — Assessment & Plan Note (Signed)
Improved with adding amlodipine.  He has mild lower ext edema.  Continue current medication regimen.  BP: 134/80 mmHg

## 2011-07-23 NOTE — Progress Notes (Signed)
Subjective:    Patient ID: Marcus [name], male    DOB: 10-29-1940, 71 y.o.   MRN: [id number]  HPI  71 year old white male with history of hypertension for routine followup. Patient's hydralazine was discontinued at previous visit. Amlodipine 5 mg added. His blood pressure has significantly improved. He notes mild lower extremity edema.  Interval history-he has had left knee arthroscopic surgery. He still has mild pain. He is not back to his normal activities.  He requests a referral to dermatologist for facial skin lesion.   Review of Systems Negative for chest pain,  He is exercising on an elliptical machine intermittently  Past Medical History  Diagnosis Date  . Unspecified vitamin D deficiency   . Calcaneal spur   . Ventral hernia, unspecified, without mention of obstruction or gangrene   . Other and unspecified hyperlipidemia   . External hemorrhoids without mention of complication   . Diverticulosis of colon (without mention of hemorrhage)   . Elevated prostate specific antigen (PSA)   . Gout, unspecified per pt stable  . Unspecified essential hypertension   . Personal history of malignant neoplasm of large intestine   . Personal history of colonic polyps 08/21/2009    TUBULAR ADENOMAS  . Lumbar spondylolysis   . Vitamin B12 deficiency   . Postgastric surgery syndromes   . Coronary artery disease cardiologist- dr Jens Som--  visit 01-08-11 in epic  . Normal nuclear stress test 04-23-2010    ef 51%,  septal hypokinesis, normal perfusion  . Echocardiogram findings abnormal, without diagnosis 04-23-2010    normal LV function, mod. left atrial enlargement, mild right artrial enlargement and grade 1 diastolic dysfunction  . Edema of lower extremity     ankles, elevates legs  . OSA (obstructive sleep apnea) no cpap use due to recent lost 50 lbs  and mask does not fit   . Hiatal hernia   . Hernia, diaphragmatic, without obstruction   . Esophageal reflux controlled w/  prilosec  . Arthritis     generalized  . Acute meniscal tear of knee left  . Frequency of urination     followed by dr dalhstedt  . Diabetes mellitus oral med    History   Social History  . Marital Status: Married    Spouse Name: N/A    Number of Children: N/A  . Years of Education: N/A   Occupational History  . semi retired    Social History Main Topics  . Smoking status: Former Smoker    Quit date: 07/08/1980  . Smokeless tobacco: Never Used  . Alcohol Use: No     currently no alcoholic beveraages (on a diet)  . Drug Use: No  . Sexually Active: Not on file   Other Topics Concern  . Not on file   Social History Narrative     MarriedFormer Smoker Alcohol use-yes 1-2 drinks per day   Occupation: Associate Professor       Past Surgical History  Procedure Date  . Vertical banded gastroplasty 1986  . Knee surgery     left  . Shoulder arthroscopy 05-23-2007    RIGHT  . Laminectomy and microdiscectomy lumbar spine MARCH  2008    L3 -  4  . Knee arthroscopy 2007    RIGHT  . Sigmoid colectomy for cancer 1989  . Cataract extraction w/ intraocular lens  implant, bilateral   . Laparoscopic incisional / umbilical / ventral hernia repair 2006  . Knee arthroscopy 07/13/2011    Procedure: ARTHROSCOPY  KNEE;  Surgeon: Erasmo Leventhal;  Location: [facility];  Service: Orthopedics;  Laterality: Left;  partial menisectomy with chondrylplasty    Family History  Problem Relation Age of Onset  . Stroke Father   . Esophagitis Father     died from perforated esophagus  . Breast cancer Mother   . Heart disease Paternal Grandfather   . Colon cancer Maternal Grandmother     questionable    Allergies  Allergen Reactions  . Contrast Media (Iodinated Diagnostic Agents) Palpitations    TACHYCARDIA    Current Outpatient Prescriptions on File Prior to Visit  Medication Sig Dispense Refill  . aspirin 81 MG tablet Take 81 mg by mouth daily.       . cyanocobalamin  (,VITAMIN B-12,) 1000 MCG/ML injection Inject 1 mL (1,000 mcg total) into the muscle every 30 (thirty) days.  10 mL  0  . HYDROcodone-acetaminophen (NORCO) 5-325 MG per tablet Take 1-2 tablets by mouth every 4 (four) hours as needed for pain.  50 tablet  0  . ipratropium (ATROVENT) 0.03 % nasal spray Place 2 sprays into the nose every evening.        . metFORMIN (GLUCOPHAGE) 500 MG tablet Take 500 mg by mouth 2 (two) times daily with a meal.       . methocarbamol (ROBAXIN) 500 MG tablet Take 1 tablet (500 mg total) by mouth every 6 (six) hours as needed.  40 tablet  4  . omeprazole (PRILOSEC) 40 MG capsule Take 40 mg by mouth every morning. Take 1 capsule daily by mouth before morning meal       . simvastatin (ZOCOR) 20 MG tablet Take 20 mg by mouth every morning.          BP 134/80  Pulse 72  Temp(Src) 98.4 F (36.9 C) (Oral)  Wt 233 lb (105.688 kg)       Objective:   Physical Exam  Constitutional: He is oriented to person, place, and time. He appears well-developed and well-nourished.  HENT:  Head: Normocephalic and atraumatic.  Cardiovascular: Normal rate, regular rhythm and normal heart sounds.   Pulmonary/Chest: Effort normal and breath sounds normal. He has no wheezes. He has no rales.  Musculoskeletal:       +1 lower extremity edema bilaterally  Neurological: He is alert and oriented to person, place, and time.  Skin: Skin is warm and dry.       Irregularly shaped 2-3 mm hyperpigmented lesion right cheek       Assessment & Plan:

## 2011-07-23 NOTE — Telephone Encounter (Signed)
rx sent in electronically 

## 2011-07-23 NOTE — Assessment & Plan Note (Signed)
Patient reports enlarging hyperpigmented lesion on right cheek (2-3 mm).   Refer to skin center for further evaluation.

## 2011-07-23 NOTE — Telephone Encounter (Signed)
Refill- allopurinol 100mg  tab qual. Take one tablet by mouth one time daily. Qty 90 last fill 10.11.12

## 2011-07-27 ENCOUNTER — Other Ambulatory Visit: Payer: Self-pay | Admitting: *Deleted

## 2011-07-27 MED ORDER — METFORMIN HCL 500 MG PO TABS: 500.0000 mg | ORAL_TABLET | Freq: Two times a day (BID) | ORAL | Status: DC

## 2011-08-09 ENCOUNTER — Other Ambulatory Visit: Payer: Self-pay | Admitting: Internal Medicine

## 2011-08-25 ENCOUNTER — Other Ambulatory Visit: Payer: Self-pay

## 2011-08-25 NOTE — Telephone Encounter (Signed)
Rx request for diazepam 2 mg; Rx last dispensed on 02/27/10.  Pt last seen 07/23/11. Pls advise.

## 2011-08-25 NOTE — Telephone Encounter (Signed)
Why is he requesting refill on diazepam?

## 2011-08-26 NOTE — Telephone Encounter (Signed)
Pt states he is having a problem sleeping and the last refill he had has lasted him a long time.  Pt states he only takes the medication if it is needed

## 2011-08-27 MED ORDER — DIAZEPAM 2 MG PO TABS: 2.0000 mg | ORAL_TABLET | Freq: Four times a day (QID) | ORAL | Status: AC | PRN

## 2011-08-27 NOTE — Telephone Encounter (Signed)
Rx called in to pharmacy. 

## 2011-08-27 NOTE — Telephone Encounter (Signed)
Ok to refill x 2  

## 2011-09-10 ENCOUNTER — Other Ambulatory Visit: Payer: Self-pay | Admitting: Internal Medicine

## 2011-10-23 ENCOUNTER — Other Ambulatory Visit: Payer: Self-pay | Admitting: Internal Medicine

## 2011-11-19 ENCOUNTER — Other Ambulatory Visit: Payer: Self-pay | Admitting: Internal Medicine

## 2011-12-14 ENCOUNTER — Ambulatory Visit (INDEPENDENT_AMBULATORY_CARE_PROVIDER_SITE_OTHER): Payer: Medicare Other | Admitting: Internal Medicine

## 2011-12-14 ENCOUNTER — Encounter: Payer: Self-pay | Admitting: Internal Medicine

## 2011-12-14 VITALS — BP 128/84 | Temp 98.2°F | Wt 231.0 lb

## 2011-12-14 DIAGNOSIS — S39012A Strain of muscle, fascia and tendon of lower back, initial encounter: Secondary | ICD-10-CM | POA: Insufficient documentation

## 2011-12-14 DIAGNOSIS — S335XXA Sprain of ligaments of lumbar spine, initial encounter: Secondary | ICD-10-CM

## 2011-12-14 HISTORY — DX: Strain of muscle, fascia and tendon of lower back, initial encounter: S39.012A

## 2011-12-14 MED ORDER — CYCLOBENZAPRINE HCL 5 MG PO TABS: 5.0000 mg | ORAL_TABLET | Freq: Every evening | ORAL | Status: AC | PRN

## 2011-12-14 NOTE — Progress Notes (Signed)
Subjective:    Patient ID: Marcus [name], male    DOB: 05-Oct-1940, 71 y.o.   MRN: [id number]  HPI  71 year old white male with history of hypertension and borderline type 2 diabetes for followup. Patient complains of low back pain x2 weeks. Symptoms are mainly on the right side. His symptoms are intermittent and described as dull and aching. Severity is 5/10. Symptoms are worse with exercise (he uses the elliptical machine)  Patient has been continuing on his weight loss program. He has been able to lose a significant amount of weight but has "hit a plateau". He inquires about any supplements he can safely use to help with weight loss.  Review of Systems No radiation of pain, no weakness  Past Medical History  Diagnosis Date  . Unspecified vitamin D deficiency   . Calcaneal spur   . Ventral hernia, unspecified, without mention of obstruction or gangrene   . Other and unspecified hyperlipidemia   . External hemorrhoids without mention of complication   . Diverticulosis of colon (without mention of hemorrhage)   . Elevated prostate specific antigen (PSA)   . Gout, unspecified per pt stable  . Unspecified essential hypertension   . Personal history of malignant neoplasm of large intestine   . Personal history of colonic polyps 08/21/2009    TUBULAR ADENOMAS  . Lumbar spondylolysis   . Vitamin B12 deficiency   . Postgastric surgery syndromes   . Coronary artery disease cardiologist- dr Jens Som--  visit 01-08-11 in epic  . Normal nuclear stress test 04-23-2010    ef 51%,  septal hypokinesis, normal perfusion  . Echocardiogram findings abnormal, without diagnosis 04-23-2010    normal LV function, mod. left atrial enlargement, mild right artrial enlargement and grade 1 diastolic dysfunction  . Edema of lower extremity     ankles, elevates legs  . OSA (obstructive sleep apnea) no cpap use due to recent lost 50 lbs  and mask does not fit   . Hiatal hernia   . Hernia, diaphragmatic,  without obstruction   . Esophageal reflux controlled w/ prilosec  . Arthritis     generalized  . Acute meniscal tear of knee left  . Frequency of urination     followed by dr dalhstedt  . Diabetes mellitus oral med    History   Social History  . Marital Status: Married    Spouse Name: N/A    Number of Children: N/A  . Years of Education: N/A   Occupational History  . semi retired    Social History Main Topics  . Smoking status: Former Smoker    Quit date: 07/08/1980  . Smokeless tobacco: Never Used  . Alcohol Use: No     currently no alcoholic beveraages (on a diet)  . Drug Use: No  . Sexually Active: Not on file   Other Topics Concern  . Not on file   Social History Narrative     MarriedFormer Smoker Alcohol use-yes 1-2 drinks per day   Occupation: Associate Professor       Past Surgical History  Procedure Date  . Vertical banded gastroplasty 1986  . Knee surgery     left  . Shoulder arthroscopy 05-23-2007    RIGHT  . Laminectomy and microdiscectomy lumbar spine MARCH  2008    L3 -  4  . Knee arthroscopy 2007    RIGHT  . Sigmoid colectomy for cancer 1989  . Cataract extraction w/ intraocular lens  implant, bilateral   . Laparoscopic  incisional / umbilical / ventral hernia repair 2006  . Knee arthroscopy 07/13/2011    Procedure: ARTHROSCOPY KNEE;  Surgeon: Erasmo Leventhal;  [facility];  Service: Orthopedics;  Laterality: Left;  partial menisectomy with chondrylplasty    Family History  Problem Relation Age of Onset  . Stroke Father   . Esophagitis Father     died from perforated esophagus  . Breast cancer Mother   . Heart disease Paternal Grandfather   . Colon cancer Maternal Grandmother     questionable    Allergies  Allergen Reactions  . Contrast Media (Iodinated Diagnostic Agents) Palpitations    TACHYCARDIA    Current Outpatient Prescriptions on File Prior to Visit  Medication Sig Dispense Refill  . allopurinol  (ZYLOPRIM) 100 MG tablet Take 1 tablet (100 mg total) by mouth daily.  90 tablet  1  . amLODipine (NORVASC) 5 MG tablet Take 1 tablet (5 mg total) by mouth daily.  90 tablet  1  . aspirin 81 MG tablet Take 81 mg by mouth daily.       . cyanocobalamin (,VITAMIN B-12,) 1000 MCG/ML injection Inject 1 mL (1,000 mcg total) into the muscle every 30 (thirty) days.  10 mL  0  . ipratropium (ATROVENT) 0.03 % nasal spray Place 2 sprays into the nose every evening.        . metFORMIN (GLUCOPHAGE) 500 MG tablet TAKE 1 TABLET (500 MG TOTAL)  BY MOUTH 2 (TWO) TIMES DAILY WITH AMEAL.  60 tablet  2  . omeprazole (PRILOSEC) 40 MG capsule Take 40 mg by mouth every morning. Take 1 capsule daily by mouth before morning meal       . simvastatin (ZOCOR) 20 MG tablet TAKE ONE TABLET BY MOUTH NIGHTLY AT BEDTIME  90 tablet  0  . valsartan-hydrochlorothiazide (DIOVAN-HCT) 320-12.5 MG per tablet TAKE ONE TABLET BY MOUTH ONE TIME DAILY  30 tablet  5  . DISCONTD: amLODipine (NORVASC) 2.5 MG tablet Take 2.5 mg by mouth every morning.        [name] Kitchen DISCONTD: ipratropium (ATROVENT) 0.03 % nasal spray Place 2 sprays into the nose every 12 (twelve) hours.  30 mL  3  . DISCONTD: omeprazole (PRILOSEC) 40 MG capsule Take 1 capsule daily by mouth before morning meal  90 capsule  1  . DISCONTD: simvastatin (ZOCOR) 20 MG tablet Take 1 tablet (20 mg total) by mouth at bedtime.  90 tablet  1  . DISCONTD: simvastatin (ZOCOR) 20 MG tablet Take 20 mg by mouth every morning.        [name] Kitchen DISCONTD: valsartan-hydrochlorothiazide (DIOVAN-HCT) 320-12.5 MG per tablet Take 1 tablet by mouth daily.  90 tablet  1    BP 128/84  Temp(Src) 98.2 F (36.8 C) (Oral)  Wt 231 lb (104.781 kg)       Objective:   Physical Exam  Constitutional: He is oriented to person, place, and time. He appears well-developed and well-nourished.  HENT:  Head: Normocephalic and atraumatic.  Cardiovascular: Normal rate, regular rhythm and normal heart sounds.     Pulmonary/Chest: Effort normal and breath sounds normal. He has no wheezes.  Musculoskeletal: He exhibits no edema.       mild right-sided back pain with lumbar flexion and side bending  Neurological: He is alert and oriented to person, place, and time. No cranial nerve deficit.  Skin: Skin is warm and dry.  Psychiatric: He has a normal mood and affect. His behavior is normal.  Assessment & Plan:

## 2011-12-14 NOTE — Assessment & Plan Note (Signed)
71 year old white male with signs and symptoms of right lumbar strain. Patient advised to avoid strenuous exercises for 1 to 2 weeks. Use cyclobenzaprine 5 mg at bedtime as needed.  Patient advised to call office if symptoms persist or worsen.

## 2011-12-14 NOTE — Patient Instructions (Signed)
You can try green coffee bean extract as weight loss supplement Please call our office if your symptoms do not improve or gets worse.

## 2012-01-21 ENCOUNTER — Other Ambulatory Visit: Payer: Self-pay | Admitting: *Deleted

## 2012-01-21 MED ORDER — VALSARTAN-HYDROCHLOROTHIAZIDE 320-12.5 MG PO TABS: 1.0000 | ORAL_TABLET | Freq: Every day | ORAL | Status: DC

## 2012-01-25 ENCOUNTER — Telehealth: Payer: Self-pay | Admitting: Internal Medicine

## 2012-01-25 ENCOUNTER — Other Ambulatory Visit: Payer: Self-pay | Admitting: Internal Medicine

## 2012-01-25 MED ORDER — ALLOPURINOL 100 MG PO TABS: 100.0000 mg | ORAL_TABLET | Freq: Every day | ORAL | Status: DC

## 2012-01-25 NOTE — Telephone Encounter (Signed)
Refill- allopurinol 100mg  tab. Take one tablet by mouth one time daily. Qty 90 last fill 4.20.13

## 2012-01-25 NOTE — Telephone Encounter (Signed)
rx sent in electronically 

## 2012-02-03 ENCOUNTER — Other Ambulatory Visit: Payer: Self-pay

## 2012-02-03 MED ORDER — ALLOPURINOL 100 MG PO TABS: 100.0000 mg | ORAL_TABLET | Freq: Every day | ORAL | Status: DC

## 2012-02-21 ENCOUNTER — Other Ambulatory Visit: Payer: Self-pay | Admitting: Internal Medicine

## 2012-04-07 ENCOUNTER — Emergency Department (HOSPITAL_BASED_OUTPATIENT_CLINIC_OR_DEPARTMENT_OTHER)
Admission: EM | Admit: 2012-04-07 | Discharge: 2012-04-07 | Disposition: A | Discharge status: Home / Self Care | Payer: Medicare Other | Attending: Emergency Medicine | Admitting: Emergency Medicine

## 2012-04-07 ENCOUNTER — Encounter (HOSPITAL_BASED_OUTPATIENT_CLINIC_OR_DEPARTMENT_OTHER): Payer: Self-pay

## 2012-04-07 DIAGNOSIS — W260XXA Contact with knife, initial encounter: Secondary | ICD-10-CM | POA: Insufficient documentation

## 2012-04-07 DIAGNOSIS — Z87891 Personal history of nicotine dependence: Secondary | ICD-10-CM | POA: Insufficient documentation

## 2012-04-07 DIAGNOSIS — Z91041 Radiographic dye allergy status: Secondary | ICD-10-CM | POA: Insufficient documentation

## 2012-04-07 DIAGNOSIS — Z85038 Personal history of other malignant neoplasm of large intestine: Secondary | ICD-10-CM | POA: Insufficient documentation

## 2012-04-07 DIAGNOSIS — I10 Essential (primary) hypertension: Secondary | ICD-10-CM | POA: Insufficient documentation

## 2012-04-07 DIAGNOSIS — Z79899 Other long term (current) drug therapy: Secondary | ICD-10-CM | POA: Insufficient documentation

## 2012-04-07 DIAGNOSIS — E785 Hyperlipidemia, unspecified: Secondary | ICD-10-CM | POA: Insufficient documentation

## 2012-04-07 DIAGNOSIS — S61209A Unspecified open wound of unspecified finger without damage to nail, initial encounter: Secondary | ICD-10-CM | POA: Insufficient documentation

## 2012-04-07 DIAGNOSIS — I2510 Atherosclerotic heart disease of native coronary artery without angina pectoris: Secondary | ICD-10-CM | POA: Insufficient documentation

## 2012-04-07 DIAGNOSIS — Z980 Intestinal bypass and anastomosis status: Secondary | ICD-10-CM | POA: Insufficient documentation

## 2012-04-07 DIAGNOSIS — Z7982 Long term (current) use of aspirin: Secondary | ICD-10-CM | POA: Insufficient documentation

## 2012-04-07 DIAGNOSIS — S61219A Laceration without foreign body of unspecified finger without damage to nail, initial encounter: Secondary | ICD-10-CM

## 2012-04-07 NOTE — ED Provider Notes (Signed)
History/physical exam/procedure(s) were performed by non-physician practitioner and as supervising physician I was immediately available for consultation/collaboration. I have reviewed all notes and am in agreement with care and plan.   Hilario Quarry, MD 04/07/12 [phone]

## 2012-04-07 NOTE — ED Notes (Addendum)
Cut left thumb tip with knife approx 4pm-slight lac noted that continues to bleed-slight avulsed area noted-sterile gauze taped in triage

## 2012-04-07 NOTE — ED Provider Notes (Signed)
History     CSN: [id number]  Arrival date & time 04/07/12  1858   First MD Initiated Contact with Patient 04/07/12 1959      Chief Complaint  Patient presents with  . Finger Injury    (Consider location/radiation/quality/duration/timing/severity/associated sxs/prior treatment) Patient is a 71 y.o. male presenting with hand pain. The history is provided by the patient. No language interpreter was used.  Hand Pain This is a new problem. The current episode started today. The problem occurs constantly. The problem has been unchanged. He has tried nothing for the symptoms.  Pt cut finger with a knife.  Pt complains of a laceration to left thumb.   Pt reports cut will not stop bleeding.  Past Medical History  Diagnosis Date  . Unspecified vitamin D deficiency   . Calcaneal spur   . Ventral hernia, unspecified, without mention of obstruction or gangrene   . Other and unspecified hyperlipidemia   . External hemorrhoids without mention of complication   . Diverticulosis of colon (without mention of hemorrhage)   . Elevated prostate specific antigen (PSA)   . Gout, unspecified per pt stable  . Unspecified essential hypertension   . Personal history of malignant neoplasm of large intestine   . Personal history of colonic polyps 08/21/2009    TUBULAR ADENOMAS  . Lumbar spondylolysis   . Vitamin B12 deficiency   . Postgastric surgery syndromes   . Coronary artery disease cardiologist- dr Jens Som--  visit 01-08-11 in epic  . Normal nuclear stress test 04-23-2010    ef 51%,  septal hypokinesis, normal perfusion  . Echocardiogram findings abnormal, without diagnosis 04-23-2010    normal LV function, mod. left atrial enlargement, mild right artrial enlargement and grade 1 diastolic dysfunction  . Edema of lower extremity     ankles, elevates legs  . OSA (obstructive sleep apnea) no cpap use due to recent lost 50 lbs  and mask does not fit   . Hiatal hernia   . Hernia, diaphragmatic,  without obstruction   . Esophageal reflux controlled w/ prilosec  . Arthritis     generalized  . Acute meniscal tear of knee left  . Frequency of urination     followed by dr dalhstedt  . Diabetes mellitus oral med    Past Surgical History  Procedure Date  . Vertical banded gastroplasty 1986  . Knee surgery     left  . Shoulder arthroscopy 05-23-2007    RIGHT  . Laminectomy and microdiscectomy lumbar spine MARCH  2008    L3 -  4  . Knee arthroscopy 2007    RIGHT  . Sigmoid colectomy for cancer 1989  . Cataract extraction w/ intraocular lens  implant, bilateral   . Laparoscopic incisional / umbilical / ventral hernia repair 2006  . Knee arthroscopy 07/13/2011    Procedure: ARTHROSCOPY KNEE;  Surgeon: Erasmo Leventhal;  Location: [facility];  Service: Orthopedics;  Laterality: Left;  partial menisectomy with chondrylplasty    Family History  Problem Relation Age of Onset  . Stroke Father   . Esophagitis Father     died from perforated esophagus  . Breast cancer Mother   . Heart disease Paternal Grandfather   . Colon cancer Maternal Grandmother     questionable    History  Substance Use Topics  . Smoking status: Former Smoker    Quit date: 07/08/1980  . Smokeless tobacco: Never Used  . Alcohol Use: No     currently no alcoholic  beveraages (on a diet)      Review of Systems  Skin: Positive for wound.  All other systems reviewed and are negative.    Allergies  Contrast media  Home Medications   Current Outpatient Rx  Name Route Sig Dispense Refill  . ALLOPURINOL 100 MG PO TABS Oral Take 1 tablet (100 mg total) by mouth daily. 90 tablet 1  . AMLODIPINE BESYLATE 5 MG PO TABS  TAKE ONE TABLET BY MOUTH ONE TIME DAILY 90 tablet 0  . ASPIRIN 81 MG PO TABS Oral Take 81 mg by mouth daily.     . CYANOCOBALAMIN 1000 MCG/ML IJ SOLN Intramuscular Inject 1 mL (1,000 mcg total) into the muscle every 30 (thirty) days. 10 mL 0    Replacement bottle.    . IPRATROPIUM BROMIDE 0.03 % NA SOLN Nasal Place 2 sprays into the nose every evening.      [name] Kitchen METFORMIN HCL 500 MG PO TABS  TAKE 1 TABLET (500 MG TOTAL)  BY MOUTH 2 (TWO) TIMES DAILY WITH AMEAL. 60 tablet 2  . OMEPRAZOLE 40 MG PO CPDR Oral Take 40 mg by mouth every morning. Take 1 capsule daily by mouth before morning meal     . SIMVASTATIN 20 MG PO TABS  TAKE ONE TABLET BY MOUTH NIGHTLY AT BEDTIME 90 tablet 1  . VALSARTAN-HYDROCHLOROTHIAZIDE 320-12.5 MG PO TABS Oral Take 1 tablet by mouth daily. 30 tablet 5    BP 129/55  Pulse 64  Temp 98.3 F (36.8 C) (Oral)  Resp 18  Ht 5\' 9"  (1.753 m)  Wt 225 lb (102.059 kg)  BMI 33.23 kg/m2  SpO2 98%  Physical Exam  Nursing note and vitals reviewed. Constitutional: He appears well-developed and well-nourished.  Musculoskeletal: He exhibits tenderness.       3x3 mm laceration tip of left thumb, oozing  Neurological: He is alert.  Psychiatric: He has a normal mood and affect.    ED Course  Procedures (including critical care time)  Labs Reviewed - No data to display No results found.   No diagnosis found.    MDM  Quick clot to wound.   Bandage.          Lonia Skinner [place], [place] 04/07/12 2013

## 2012-04-27 ENCOUNTER — Other Ambulatory Visit: Payer: Self-pay | Admitting: Internal Medicine

## 2012-05-08 ENCOUNTER — Telehealth: Payer: Self-pay | Admitting: Internal Medicine

## 2012-05-08 ENCOUNTER — Other Ambulatory Visit: Payer: Self-pay | Admitting: Internal Medicine

## 2012-05-08 DIAGNOSIS — E538 Deficiency of other specified B group vitamins: Secondary | ICD-10-CM

## 2012-05-08 MED ORDER — CYANOCOBALAMIN 1000 MCG/ML IJ SOLN: 1000.0000 ug | INTRAMUSCULAR | Status: DC

## 2012-05-08 NOTE — Telephone Encounter (Signed)
Refill- cyanocobalam inj. Inject 1 ml(1,000 mcg total) into the muscle every 30 days. Qty 10 last fill 9.20.12

## 2012-05-08 NOTE — Telephone Encounter (Signed)
rx sent in electronically 

## 2012-07-29 ENCOUNTER — Other Ambulatory Visit: Payer: Self-pay | Admitting: Internal Medicine

## 2012-08-09 ENCOUNTER — Other Ambulatory Visit: Payer: Self-pay | Admitting: Internal Medicine

## 2012-08-14 ENCOUNTER — Other Ambulatory Visit: Payer: Self-pay | Admitting: Internal Medicine

## 2012-08-15 ENCOUNTER — Telehealth: Payer: Self-pay | Admitting: Internal Medicine

## 2012-08-15 NOTE — Telephone Encounter (Signed)
Caller: Mary/Spouse; Phone: [phone]; Reason for Call: Returned call to patient.  Patient currently not available and spoke with patients wife.  States that patient was calling to verify medication that he picked up from the pharmacy as he thought he was no longer taking the Norvasc.  Wife to have patient return call once he returns.

## 2012-08-15 NOTE — Telephone Encounter (Signed)
According to our records, pt should have been taking amlodipine all along.  Has he not been taking amlodipine.  What is his current blood pressure?  If there is confusion, I suggest OV to clarify

## 2012-08-15 NOTE — Telephone Encounter (Signed)
noted 

## 2012-08-15 NOTE — Telephone Encounter (Signed)
Caller: Marcus [name]/Patient; Phone: [phone]; Reason for Call: Patient returning call in regards to question about Amlodipine and whether or not he should be taking this medication.  Reviewed note in Epic for last visit and per Visit note on 12/14/11 patient's Amlodipine 2.  5mg  dosage was discontinued and Amlodipine 5mg  was active on patients chart.  Patient currently has prescription for Amlodipine 5mg .  Informed that that is still an active medication that is appropriate for him to take as prescribed.

## 2012-08-15 NOTE — Telephone Encounter (Signed)
Left message for pt to call back  °

## 2012-08-21 NOTE — Telephone Encounter (Signed)
Pt refilled the amlodipine and is back on it

## 2012-09-20 ENCOUNTER — Encounter: Payer: Self-pay | Admitting: Gastroenterology

## 2012-09-26 ENCOUNTER — Encounter: Payer: Self-pay | Admitting: Gastroenterology

## 2012-10-02 ENCOUNTER — Telehealth: Payer: Self-pay | Admitting: Internal Medicine

## 2012-10-02 ENCOUNTER — Encounter (HOSPITAL_COMMUNITY): Payer: Self-pay | Admitting: Emergency Medicine

## 2012-10-02 ENCOUNTER — Other Ambulatory Visit: Payer: Self-pay

## 2012-10-02 ENCOUNTER — Other Ambulatory Visit: Payer: Self-pay | Admitting: Physician Assistant

## 2012-10-02 ENCOUNTER — Emergency Department (HOSPITAL_COMMUNITY)
Admission: EM | Admit: 2012-10-02 | Discharge: 2012-10-02 | Disposition: A | Discharge status: Home / Self Care | Payer: Medicare Other | Attending: Emergency Medicine | Admitting: Emergency Medicine

## 2012-10-02 ENCOUNTER — Emergency Department (HOSPITAL_COMMUNITY): Payer: Medicare Other

## 2012-10-02 DIAGNOSIS — Z87891 Personal history of nicotine dependence: Secondary | ICD-10-CM | POA: Insufficient documentation

## 2012-10-02 DIAGNOSIS — G4733 Obstructive sleep apnea (adult) (pediatric): Secondary | ICD-10-CM | POA: Insufficient documentation

## 2012-10-02 DIAGNOSIS — E119 Type 2 diabetes mellitus without complications: Secondary | ICD-10-CM | POA: Insufficient documentation

## 2012-10-02 DIAGNOSIS — I2510 Atherosclerotic heart disease of native coronary artery without angina pectoris: Secondary | ICD-10-CM | POA: Insufficient documentation

## 2012-10-02 DIAGNOSIS — Z860100 Personal history of colon polyps, unspecified: Secondary | ICD-10-CM | POA: Insufficient documentation

## 2012-10-02 DIAGNOSIS — Z7982 Long term (current) use of aspirin: Secondary | ICD-10-CM | POA: Insufficient documentation

## 2012-10-02 DIAGNOSIS — R079 Chest pain, unspecified: Secondary | ICD-10-CM

## 2012-10-02 DIAGNOSIS — E785 Hyperlipidemia, unspecified: Secondary | ICD-10-CM | POA: Insufficient documentation

## 2012-10-02 DIAGNOSIS — Z8719 Personal history of other diseases of the digestive system: Secondary | ICD-10-CM | POA: Insufficient documentation

## 2012-10-02 DIAGNOSIS — Z79899 Other long term (current) drug therapy: Secondary | ICD-10-CM | POA: Insufficient documentation

## 2012-10-02 LAB — CBC
HCT: 40.5 % (ref 39.0–52.0)
Hemoglobin: 14.3 g/dL (ref 13.0–17.0)
MCH: 29.4 pg (ref 26.0–34.0)
MCHC: 35.3 g/dL (ref 30.0–36.0)
MCV: 83.2 fL (ref 78.0–100.0)
Platelets: 199 10*3/uL (ref 150–400)
RBC: 4.87 MIL/uL (ref 4.22–5.81)
RDW: 14.2 % (ref 11.5–15.5)
WBC: 5.3 10*3/uL (ref 4.0–10.5)

## 2012-10-02 LAB — POCT I-STAT TROPONIN I
Troponin i, poc: 0 ng/mL (ref 0.00–0.08)
Troponin i, poc: 0 ng/mL (ref 0.00–0.08)

## 2012-10-02 LAB — BASIC METABOLIC PANEL
BUN: 18 mg/dL (ref 6–23)
CO2: 26 mEq/L (ref 19–32)
Calcium: 9.5 mg/dL (ref 8.4–10.5)
Chloride: 105 mEq/L (ref 96–112)
Creatinine, Ser: 1.07 mg/dL (ref 0.50–1.35)
GFR calc Af Amer: 78 mL/min — ABNORMAL LOW (ref >90)
GFR calc non Af Amer: 67 mL/min — ABNORMAL LOW (ref >90)
Glucose, Bld: 105 mg/dL — ABNORMAL HIGH (ref 70–99)
Potassium: 4.3 mEq/L (ref 3.5–5.1)
Sodium: 141 mEq/L (ref 135–145)

## 2012-10-02 NOTE — ED Provider Notes (Addendum)
History     CSN: [id number]  Arrival date & time 10/02/12  1244   First MD Initiated Contact with Patient 10/02/12 1337      Chief Complaint  Patient presents with  . Chest Pain    (Consider location/radiation/quality/duration/timing/severity/associated sxs/prior treatment) Patient is a 72 y.o. male presenting with chest pain. The history is provided by the patient and the spouse.  Chest Pain  patient here complaining of chest discomfort x1 month. Characterizes this as a dull ache and lasting for minutes. No associated dyspnea diaphoresis. No fever or cough. Pain is sometimes worse with eating and sometimes made better with certain positions. History of similar symptoms in the past and according to the patient he had a negative heart catheterization 15 years ago. Denies any change in his current symptoms but called his doctor and was told to come here for further evaluation. No treatment used prior to arrival.  Past Medical History  Diagnosis Date  . Unspecified vitamin D deficiency   . Calcaneal spur   . Ventral hernia, unspecified, without mention of obstruction or gangrene   . Other and unspecified hyperlipidemia   . External hemorrhoids without mention of complication   . Diverticulosis of colon (without mention of hemorrhage)   . Elevated prostate specific antigen (PSA)   . Gout, unspecified per pt stable  . Unspecified essential hypertension   . Personal history of malignant neoplasm of large intestine   . Personal history of colonic polyps 08/21/2009    TUBULAR ADENOMAS  . Lumbar spondylolysis   . Vitamin B12 deficiency   . Postgastric surgery syndromes   . Coronary artery disease cardiologist- dr Jens Som--  visit 01-08-11 in epic  . Normal nuclear stress test 04-23-2010    ef 51%,  septal hypokinesis, normal perfusion  . Echocardiogram findings abnormal, without diagnosis 04-23-2010    normal LV function, mod. left atrial enlargement, mild right artrial enlargement and  grade 1 diastolic dysfunction  . Edema of lower extremity     ankles, elevates legs  . OSA (obstructive sleep apnea) no cpap use due to recent lost 50 lbs  and mask does not fit   . Hiatal hernia   . Hernia, diaphragmatic, without obstruction   . Esophageal reflux controlled w/ prilosec  . Arthritis     generalized  . Acute meniscal tear of knee left  . Frequency of urination     followed by dr dalhstedt  . Diabetes mellitus oral med    Past Surgical History  Procedure Laterality Date  . Vertical banded gastroplasty  1986  . Knee surgery      left  . Shoulder arthroscopy  05-23-2007    RIGHT  . Laminectomy and microdiscectomy lumbar spine  MARCH  2008    L3 -  4  . Knee arthroscopy  2007    RIGHT  . Sigmoid colectomy for cancer  1989  . Cataract extraction w/ intraocular lens  implant, bilateral    . Laparoscopic incisional / umbilical / ventral hernia repair  2006  . Knee arthroscopy  07/13/2011    Procedure: ARTHROSCOPY KNEE;  Surgeon: Erasmo Leventhal;  Location: [facility];  Service: Orthopedics;  Laterality: Left;  partial menisectomy with chondrylplasty    Family History  Problem Relation Age of Onset  . Stroke Father   . Esophagitis Father     died from perforated esophagus  . Breast cancer Mother   . Heart disease Paternal Grandfather   . Colon cancer Maternal  Grandmother     questionable    History  Substance Use Topics  . Smoking status: Former Smoker    Quit date: 07/08/1980  . Smokeless tobacco: Never Used  . Alcohol Use: No     Comment: currently no alcoholic beveraages (on a diet)      Review of Systems  Cardiovascular: Positive for chest pain.  All other systems reviewed and are negative.    Allergies  Contrast media  Home Medications   Current Outpatient Rx  Name  Route  Sig  Dispense  Refill  . allopurinol (ZYLOPRIM) 100 MG tablet   Oral   Take 100 mg by mouth daily.         [name] 5 MG  tablet   Oral   Take 5 mg by mouth daily.         [name] Kitchen aspirin 81 MG chewable tablet   Oral   Chew 81 mg by mouth daily.         . cyanocobalamin (,VITAMIN B-12,) 1000 MCG/ML injection   Intramuscular   Inject 1,000 mcg into the muscle every 30 (thirty) days.         [name] Kitchen ipratropium (ATROVENT) 0.03 % nasal spray   Nasal   Place 2 sprays into the nose at bedtime.         . metFORMIN (GLUCOPHAGE) 500 MG tablet   Oral   Take 500 mg by mouth 2 (two) times daily with a meal.         . omeprazole (PRILOSEC) 40 MG capsule   Oral   Take 40 mg by mouth daily before breakfast.         . simvastatin (ZOCOR) 20 MG tablet   Oral   Take 20 mg by mouth at bedtime.         . valsartan-hydrochlorothiazide (DIOVAN-HCT) 320-12.5 MG per tablet   Oral   Take 1 tablet by mouth daily.   30 tablet   5     BP 147/78  Pulse 63  Temp(Src) 97.7 F (36.5 C) (Oral)  Resp 18  SpO2 97%  Physical Exam  Nursing note and vitals reviewed. Constitutional: He is oriented to person, place, and time. He appears well-developed and well-nourished.  Non-toxic appearance. No distress.  HENT:  Head: Normocephalic and atraumatic.  Eyes: Conjunctivae, EOM and lids are normal. Pupils are equal, round, and reactive to light.  Neck: Normal range of motion. Neck supple. No tracheal deviation present. No mass present.  Cardiovascular: Normal rate, regular rhythm and normal heart sounds.  Exam reveals no gallop.   No murmur heard. Pulmonary/Chest: Effort normal and breath sounds normal. No stridor. No respiratory distress. He has no decreased breath sounds. He has no wheezes. He has no rhonchi. He has no rales.  Abdominal: Soft. Normal appearance and bowel sounds are normal. He exhibits no distension. There is no tenderness. There is no rebound and no CVA tenderness.  Musculoskeletal: Normal range of motion. He exhibits no edema and no tenderness.  Neurological: He is alert and oriented to person, place,  and time. He has normal strength. No cranial nerve deficit or sensory deficit. GCS eye subscore is 4. GCS verbal subscore is 5. GCS motor subscore is 6.  Skin: Skin is warm and dry. No abrasion and no rash noted.  Psychiatric: He has a normal mood and affect. His speech is normal and behavior is normal.    ED Course  Procedures (including critical care time)  Labs Reviewed  BASIC METABOLIC PANEL - Abnormal; Notable for the following:    Glucose, Bld 105 (*)    GFR calc non Af Amer 67 (*)    GFR calc Af Amer 78 (*)    All other components within normal limits  CBC  POCT I-STAT TROPONIN I   Dg Chest 2 View  10/02/2012  *RADIOLOGY REPORT*  Clinical Data: Chest pain  CHEST - 2 VIEW  Comparison: April 20, 2010.  Findings: Cardiomediastinal silhouette appears normal.  Eventration of the anterior left hemidiaphragm is unchanged.  No acute pulmonary disease is noted.  Bony thorax is intact.  IMPRESSION: No acute cardiopulmonary abnormality seen.   Original Report Authenticated By: Lupita Raider.,  M.D.      No diagnosis found.    MDM   Date: 10/02/2012  Rate: 69  Rhythm: normal sinus rhythm  QRS Axis: normal  Intervals: normal  ST/T Wave abnormalities: normal  Conduction Disutrbances:none  Narrative Interpretation:   Old EKG Reviewed: none available  Spoke with cardiology and then going to admit the patient  6:49 PM Spoke with Dr. Sherlie Ban from cardiology and the plan is the patient had a stress test tomorrow. His second cardiac markers negative.      Toy Baker, MD 10/02/12 1622  Toy Baker, MD 10/02/12 [phone]

## 2012-10-02 NOTE — Consult Note (Signed)
CARDIOLOGY CONSULT NOTE  Patient ID: Marcus [name] MRN: [id number] DOB/AGE: 01/01/1941 72 y.o.  Admit date: 10/02/2012 Primary Physician: Dr. Artist Pais Primary Cardiologist: Dr. Jens Som Reason for Consultation: Chest pain  HPI: 72 yo with history of HTN, DM2, and hyperlipidemia presented to the ER today with chest pain.  Patient has a long history of atypical chest pain (15-20 years).  He had a cath about 15 years ago that was normal.  Last Myoview was in 10/11 and showed no ischemia or infarction.  He had actually done well for about 6 months without significant chest pain. He was exercising and losing weight.  More recently, he has been exercising less and weight has been going up.  For 3-4 weeks, he has been having periodic chest pain episodes, similar to his prior pattern.  He will get a dull left-sided chest ache that will last 1-5 minutes.  There is no trigger.  They are not exertional.  He does not have significant exertional symptoms.  Today, he had the same due chest ache but it radiated to his left shoulder.  It lasted 10 minutes.  He told his wife about it and she convinced him to come to the ER.  So far, in the ER ECG is normal and 1st set of cardiac markers is negative.  He does not have any chest pain at this time.    Review of systems complete and found to be negative unless listed above in HPI  Past Medical History: 1. Atypical chest pain: x 15-20 years.  Had cath about 15 years ago in [place], was negative per patient's report.  Myoview (10/11) with septal hypokinesis but no evidence for ischemia or infarction.  Echo (10/11) with EF 55-60%, mild to moderate LVH.  2. H/o ventral hernia 3. Diverticulosis 4. Colon cancer s/p sigmoid colectomy 5. B12 deficiency 6. OSA 7. GERD 8. Type II diabetes 9. Low back pain s/p laminectomy 10. Gout 11. Hyperlipidemia  Family History  Problem Relation Age of Onset  . Stroke Father   . Esophagitis Father     died from perforated  esophagus  . Breast cancer Mother   . Heart disease Paternal Grandfather   . Colon cancer Maternal Grandmother     questionable    History   Social History  . Marital Status: Married    Spouse Name: N/A    Number of Children: N/A  . Years of Education: N/A   Occupational History  . semi retired    Social History Main Topics  . Smoking status: Former Smoker    Quit date: 07/08/1980  . Smokeless tobacco: Never Used  . Alcohol Use: No     Comment: currently no alcoholic beveraages (on a diet)  . Drug Use: No  . Sexually Active: Not on file   Other Topics Concern  . Not on file   Social History Narrative     Married   Former Smoker    Alcohol use-yes 1-2 drinks per day      Occupation: Associate Professor         (Not in a hospital admission)  Physical exam Blood pressure 131/80, pulse 56, temperature 97.7 F (36.5 C), temperature source Oral, resp. rate 20, SpO2 97.00%. General: NAD Neck: No JVD, no thyromegaly or thyroid nodule.  Lungs: Clear to auscultation bilaterally with normal respiratory effort. CV: Nondisplaced PMI.  Heart regular S1/S2, no S3/S4, no murmur.  No peripheral edema.  No carotid bruit.  Normal pedal pulses.  Abdomen: Soft, nontender, no hepatosplenomegaly, no distention.  Skin: Intact without lesions or rashes.  Neurologic: Alert and oriented x 3.  Psych: Normal affect. Extremities: No clubbing or cyanosis.  HEENT: Normal.   Labs:   Lab Results  Component Value Date   WBC 5.3 10/02/2012   HGB 14.3 10/02/2012   HCT 40.5 10/02/2012   MCV 83.2 10/02/2012   PLT 199 10/02/2012    Recent Labs Lab 10/02/12 1303  NA 141  K 4.3  CL 105  CO2 26  BUN 18  CREATININE 1.07  CALCIUM 9.5  GLUCOSE 105*      Radiology: - CXR: No acute findings  EKG: NSR, normal  ASSESSMENT AND PLAN:  72 yo with long history of atypical chest pain as well as DM, HTN, and hyperlipidemia presents with atypical chest pain.  ECG and initial cardiac enzymes negative.  He  no longer has chest pain.  I will repeat a 2nd set of cardiac markers at 4 hours here in the ER.  If this is negative, I think that he can go home with plans to get an ETT Sestamibi at our office either Tuesday or Wednesday.  We will arrange.   Signed: Marca Ancona 10/02/2012 4:38 PM

## 2012-10-02 NOTE — ED Notes (Signed)
Pt c/o intermittent left sided CP x 1 month starting this am again; pt sts some pain in left arm and shoulder; pt denies SOB or nausea

## 2012-10-02 NOTE — ED Notes (Signed)
Patient states "I've been having L sided chest pain for years.  I have been worked up in [place] several times".  Patient said his last catheterization was approximately 15 years ago.  Patient states that the pain just recently "started acting back up".   MD at bedside for evaluation.

## 2012-10-02 NOTE — ED Notes (Signed)
Pt up to bathroom without any problems 

## 2012-10-02 NOTE — ED Notes (Signed)
Pt resting quietly watching TV.  Continues to deny any chest pain or other complaints.

## 2012-10-02 NOTE — ED Notes (Signed)
Cardiologist in to assess pt at this time.   

## 2012-10-02 NOTE — Progress Notes (Signed)
Called office. Spoke with pt/wife. Pt has GXT MV scheduled tomorrow am. Office may need to reach him re: insurance issues. Confirmed phone numbers - cell phone is best number to use.  Advised pt/wife of situation. They are aware and agree with plan.  Theodore Demark, PA-C 10/02/2012 5:28 PM Beeper [phone]

## 2012-10-02 NOTE — Telephone Encounter (Signed)
Patient Information:  Caller Name: Krew  Phone: [phone]  Patient: Marcus [name], Marcus [name]  Gender: Male  DOB: July 29, 1940  Age: 72 Years  PCP: Artist Pais Doe-Hyun Molly Maduro) (Adults only)  Office Follow Up:  Does the office need to follow up with this patient?: No  Instructions For The Office: N/A  RN Note:  Intermittent dull chest pain present for 20-30 minutes within the last hour. Denied chest pain at time of call. Chest pain episodes recurred in last month. Knows it will happen again. Does not test blood sugar. "Muscular",  left shoulder pain present now described as "aching." Left hand is cold compared to right. Declined to call 911; stated wife will take him to [facility] ED.  Symptoms  Reason For Call & Symptoms: Intermittent, dull, left-sided chest pain. Chest pain present intermittent for the last 10 years.  Occurs anytime; sometimes upon arising or after eating.   Reviewed Health History In EMR: Yes  Reviewed Medications In EMR: Yes  Reviewed Allergies In EMR: Yes  Reviewed Surgeries / Procedures: Yes  Date of Onset of Symptoms: 09/04/2012  Guideline(s) Used:  Chest Pain  Disposition Per Guideline:   Call EMS 911 Now  Reason For Disposition Reached:   Chest pain lasting longer than 5 minutes and ANY of the following:  Over 44 years old Over 28 years old and at least one cardiac risk factor (i.e., high blood pressure, diabetes, high cholesterol, obesity, smoker or strong family history of heart disease) Pain is crushing, pressure-like, or heavy  Took nitroglycerin and chest pain was not relieved History of heart disease (i.e., angina, heart attack, bypass surgery, angioplasty, CHF)  Advice Given:  N/A  Patient Refused Recommendation:  Patient Will Go To ED  Declined 911; stated wife will drive to ED now; Advised 911 is fastest and safest way into healthcare and prevents any further treatment delay.  Agreed wife will call 911 if syncope or chest pain worsens on way to  ED.

## 2012-10-03 ENCOUNTER — Ambulatory Visit (HOSPITAL_COMMUNITY): Payer: Medicare Other | Attending: Internal Medicine | Admitting: Radiology

## 2012-10-03 VITALS — BP 139/74 | HR 55 | Ht 69.0 in | Wt 241.0 lb

## 2012-10-03 DIAGNOSIS — I10 Essential (primary) hypertension: Secondary | ICD-10-CM | POA: Insufficient documentation

## 2012-10-03 DIAGNOSIS — Z87891 Personal history of nicotine dependence: Secondary | ICD-10-CM | POA: Insufficient documentation

## 2012-10-03 DIAGNOSIS — R079 Chest pain, unspecified: Secondary | ICD-10-CM | POA: Insufficient documentation

## 2012-10-03 DIAGNOSIS — E119 Type 2 diabetes mellitus without complications: Secondary | ICD-10-CM | POA: Insufficient documentation

## 2012-10-03 MED ORDER — TECHNETIUM TC 99M SESTAMIBI GENERIC - CARDIOLITE
30.0000 | Freq: Once | INTRAVENOUS | Status: AC | PRN
  Administered 2012-10-03: 30 via INTRAVENOUS

## 2012-10-03 MED ORDER — TECHNETIUM TC 99M SESTAMIBI GENERIC - CARDIOLITE
10.0000 | Freq: Once | INTRAVENOUS | Status: AC | PRN
  Administered 2012-10-03: 10 via INTRAVENOUS

## 2012-10-03 NOTE — Progress Notes (Signed)
[facility] SITE 3 NUCLEAR MED [address] [place], [place] [address] [phone]    Cardiology Nuclear Med Study  Marcus [name] is a 72 y.o. male     MRN : [id number]     DOB: 1941/04/20  Procedure Date: 10/03/2012  Nuclear Med Background Indication for Stress Test:  Evaluation for Ischemia and Post Hospital on 10/02/12 with Chest Pain, negative enzymes  History:  ~15 yrs ago Cath:normal per pt; '11 Echo:EF=60%; '11 IHK:VQQVZD, EF=51% Cardiac Risk Factors: History of Smoking, Hypertension, Lipids and NIDDM  Symptoms:  Chest Pain/ "Dull Ache" (last episode of chest discomfort none since discharge)   Nuclear Pre-Procedure Caffeine/Decaff Intake:  None NPO After: 7:00pm   Lungs:  Clear. O2 Sat: 96% on room air. IV 0.9% NS with Angio Cath:  20g  IV Site: L Wrist  IV Started by:  Doyne Keel, CNMT  Chest Size (in):  50 Cup Size: n/a  Height: 5\' 9"  (1.753 m)  Weight:  241 lb (109.317 kg)  BMI:  Body mass index is 35.57 kg/(m^2). Tech Comments:  n/a    Nuclear Med Study 1 or 2 day study: 1 day  Stress Test Type:  Stress  Reading MD: Willa Rough, MD  Order Authorizing Provider:  Thomos Lemons, MD and Olga Millers, MD  Resting Radionuclide: Technetium 73m Sestamibi  Resting Radionuclide Dose: 11.0 mCi   Stress Radionuclide:  Technetium 70m Sestamibi  Stress Radionuclide Dose: 32.9 mCi           Stress Protocol Rest HR: 55 Stress HR: 130  Rest BP: 139/74 Stress BP: 212/60  Exercise Time (min): 6;46 METS: 7.0   Predicted Max HR: 148 bpm % Max HR: 87.84 bpm Rate Pressure Product: 63875   Dose of Adenosine (mg):  n/a Dose of Lexiscan: n/a mg  Dose of Atropine (mg): n/a Dose of Dobutamine: n/a mcg/kg/min (at max HR)  Stress Test Technologist: Smiley Houseman, CMA-N  Nuclear Technologist:  Domenic Polite, CNMT     Rest Procedure:  Myocardial perfusion imaging was performed at rest 45 minutes following the intravenous administration of Technetium 84m  Sestamibi.  Rest ECG: No significant ST abnormalities  Stress Procedure:  The patient exercised on the treadmill utilizing the Bruce Protocol for 6:46 minutes. The patient stopped due to fatigue and denied any chest pain.  Technetium 29m Sestamibi was injected at peak exercise and myocardial perfusion imaging was performed after a brief delay.  Stress ECG: No significant ST segment change suggestive of ischemia.  QPS Raw Data Images:  Normal; no motion artifact; normal heart/lung ratio. Stress Images:  There is a small area with moderate decreased uptake at the base inferior segment Rest Images:  There is a small area with moderate decrease uptake at the base inferior segment Subtraction (SDS):  No evidence of ischemia. Transient Ischemic Dilatation (Normal <1.22):  0.98 Lung/Heart Ratio (Normal <0.45):  0.28  Quantitative Gated Spect Images QGS EDV:  103 ml QGS ESV:  43 ml  Impression Exercise Capacity:  Fair exercise capacity. BP Response:  Hypertensive blood pressure response. Clinical Symptoms:  shortness of breath ECG Impression:  No significant ST segment change suggestive of ischemia. Scattered PVCs were present Comparison with Prior Nuclear Study: No images to compare  Overall Impression:  The images suggest a very small area of scar at the base of the inferior wall. There seems to be slight decrease wall motion in this area also. There is no ischemia. By report, a nuclear scan from 2011  was read as normal. However the images are not available.  LV Ejection Fraction: 59%.  LV Wall Motion:  There is mild hypokinesis isolated to the basal inferior segment.  Willa Rough, MD

## 2012-10-13 ENCOUNTER — Other Ambulatory Visit (INDEPENDENT_AMBULATORY_CARE_PROVIDER_SITE_OTHER): Payer: Medicare Other

## 2012-10-13 DIAGNOSIS — E785 Hyperlipidemia, unspecified: Secondary | ICD-10-CM

## 2012-10-13 LAB — TSH: TSH: 0.75 u[IU]/mL (ref 0.35–5.50)

## 2012-10-13 LAB — HEPATIC FUNCTION PANEL
ALT: 15 U/L (ref 0–53)
AST: 17 U/L (ref 0–37)
Albumin: 3.5 g/dL (ref 3.5–5.2)
Alkaline Phosphatase: 47 U/L (ref 39–117)
Bilirubin, Direct: 0.1 mg/dL (ref 0.0–0.3)
Total Bilirubin: 0.4 mg/dL (ref 0.3–1.2)
Total Protein: 6.7 g/dL (ref 6.0–8.3)

## 2012-10-13 LAB — LIPID PANEL
Cholesterol: 126 mg/dL (ref 0–200)
HDL: 33.6 mg/dL — ABNORMAL LOW (ref >39.00)
LDL Cholesterol: 81 mg/dL (ref 0–99)
Total CHOL/HDL Ratio: 4
Triglycerides: 56 mg/dL (ref 0.0–149.0)
VLDL: 11.2 mg/dL (ref 0.0–40.0)

## 2012-10-13 LAB — VITAMIN B12: Vitamin B-12: 474 pg/mL (ref 211–911)

## 2012-10-30 ENCOUNTER — Ambulatory Visit (AMBULATORY_SURGERY_CENTER): Payer: Medicare Other | Admitting: *Deleted

## 2012-10-30 ENCOUNTER — Encounter: Payer: Self-pay | Admitting: Gastroenterology

## 2012-10-30 VITALS — Ht 69.0 in | Wt 242.0 lb

## 2012-10-30 DIAGNOSIS — Z86010 Personal history of colonic polyps: Secondary | ICD-10-CM

## 2012-10-30 DIAGNOSIS — Z1211 Encounter for screening for malignant neoplasm of colon: Secondary | ICD-10-CM

## 2012-10-30 DIAGNOSIS — Z85048 Personal history of other malignant neoplasm of rectum, rectosigmoid junction, and anus: Secondary | ICD-10-CM

## 2012-10-30 MED ORDER — MOVIPREP 100 G PO SOLR: ORAL | Status: DC

## 2012-10-30 NOTE — Progress Notes (Signed)
emmi information given to patient. 

## 2012-10-31 ENCOUNTER — Other Ambulatory Visit: Payer: Self-pay | Admitting: Internal Medicine

## 2012-11-07 ENCOUNTER — Encounter: Payer: Self-pay | Admitting: Cardiology

## 2012-11-07 ENCOUNTER — Ambulatory Visit (INDEPENDENT_AMBULATORY_CARE_PROVIDER_SITE_OTHER): Payer: Medicare Other | Admitting: Cardiology

## 2012-11-07 VITALS — BP 142/78 | HR 54 | Ht 69.0 in | Wt 245.0 lb

## 2012-11-07 DIAGNOSIS — R079 Chest pain, unspecified: Secondary | ICD-10-CM

## 2012-11-07 DIAGNOSIS — I10 Essential (primary) hypertension: Secondary | ICD-10-CM

## 2012-11-07 DIAGNOSIS — E785 Hyperlipidemia, unspecified: Secondary | ICD-10-CM

## 2012-11-07 NOTE — Progress Notes (Signed)
HPI: Pleasant male for followup of chest pain. Patient apparently with long history of atypical chest pain. Previous cardiac catheterization normal in [place] by his report. Patient saw Dr. Juanda Chance in October of 2011 with complaints of chest pain and dyspnea. Chest x-ray, d-dimer and BNP unremarkable. Myoview in Oct of 2011 showed an ejection fraction of 51%, septal hypokinesis but normal perfusion. Echocardiogram in October of 2011 showed normal LV function, moderate left atrial enlargement, mild right atrial enlargement and grade 1 diastolic dysfunction. Patient seen in the emergency room in March of 2014 with atypical chest pain. Seen by Dr. Shirlee Latch. Enzymes and electrocardiogram negative. Outpatient nuclear study in April of 2014 showed ejection fraction 59%. There was a small scar in the inferior wall but no ischemia. Since that time, he denies dyspnea, chest pain, palpitations or syncope. Note the pain that he has been seen for previously he has had intermittently for years. It is in the left chest area without radiation. It lasts 1 hour and resolves spontaneously. It is not exertional, pleuritic or related to food and no associated symptoms.  Current Outpatient Prescriptions  Medication Sig Dispense Refill  . allopurinol (ZYLOPRIM) 100 MG tablet Take 100 mg by mouth daily.      [name] 5 MG tablet TAKE ONE TABLET BY MOUTH ONE TIME DAILY  90 tablet  0  . aspirin 81 MG chewable tablet Chew 81 mg by mouth daily.      . cyanocobalamin (,VITAMIN B-12,) 1000 MCG/ML injection Inject 1,000 mcg into the muscle every 30 (thirty) days.      [name] Kitchen ipratropium (ATROVENT) 0.03 % nasal spray Place 2 sprays into the nose at bedtime.      . metFORMIN (GLUCOPHAGE) 500 MG tablet TAKE ONE TABLET BY MOUTH TWICE DAILY  180 tablet  0  . MOVIPREP 100 G SOLR Take as directed.  1 kit  0  . omeprazole (PRILOSEC) 40 MG capsule Take 40 mg by mouth daily before breakfast.      . simvastatin (ZOCOR) 20 MG tablet  TAKE ONE TABLET BY MOUTH   NIGHTLY AT BEDTIME  90 tablet  0  . valsartan-hydrochlorothiazide (DIOVAN-HCT) 320-12.5 MG per tablet TAKE ONE TABLET BY MOUTH ONE TIME DAILY  90 tablet  0   No current facility-administered medications for this visit.     Past Medical History  Diagnosis Date  . Unspecified vitamin D deficiency   . Calcaneal spur   . Ventral hernia, unspecified, without mention of obstruction or gangrene   . Other and unspecified hyperlipidemia   . External hemorrhoids without mention of complication   . Diverticulosis of colon (without mention of hemorrhage)   . Elevated prostate specific antigen (PSA)   . Gout, unspecified per pt stable  . Unspecified essential hypertension   . Personal history of malignant neoplasm of large intestine   . Personal history of colonic polyps 08/21/2009    TUBULAR ADENOMAS  . Lumbar spondylolysis   . Vitamin B12 deficiency   . Postgastric surgery syndromes   . Coronary artery disease cardiologist- dr Jens Som--  visit 01-08-11 in epic  . Normal nuclear stress test 04-23-2010    ef 51%,  septal hypokinesis, normal perfusion  . Echocardiogram findings abnormal, without diagnosis 04-23-2010    normal LV function, mod. left atrial enlargement, mild right artrial enlargement and grade 1 diastolic dysfunction  . Edema of lower extremity     ankles, elevates legs  . OSA (obstructive sleep apnea) no cpap use due  to recent lost 50 lbs  and mask does not fit   . Hiatal hernia   . Hernia, diaphragmatic, without obstruction   . Esophageal reflux controlled w/ prilosec  . Arthritis     generalized  . Acute meniscal tear of knee left  . Frequency of urination     followed by dr dalhstedt  . Diabetes mellitus oral med    Past Surgical History  Procedure Laterality Date  . Vertical banded gastroplasty  1986  . Knee surgery      left  . Shoulder arthroscopy  05-23-2007    RIGHT  . Laminectomy and microdiscectomy lumbar spine  MARCH  2008     L3 -  4  . Knee arthroscopy  2007    RIGHT  . Sigmoid colectomy for cancer  1989  . Cataract extraction w/ intraocular lens  implant, bilateral    . Laparoscopic incisional / umbilical / ventral hernia repair  2006  . Knee arthroscopy  07/13/2011    Procedure: ARTHROSCOPY KNEE;  Surgeon: Erasmo Leventhal;  Location: [facility];  Service: Orthopedics;  Laterality: Left;  partial menisectomy with chondrylplasty    History   Social History  . Marital Status: Married    Spouse Name: N/A    Number of Children: N/A  . Years of Education: N/A   Occupational History  . semi retired    Social History Main Topics  . Smoking status: Former Smoker    Quit date: 07/08/1980  . Smokeless tobacco: Never Used  . Alcohol Use: No     Comment: currently no alcoholic beveraages (on a diet)  . Drug Use: No  . Sexually Active: Not on file   Other Topics Concern  . Not on file   Social History Narrative     Married   Former Smoker    Alcohol use-yes 1-2 drinks per day      Occupation: Associate Professor       ROS: no fevers or chills, productive cough, hemoptysis, dysphasia, odynophagia, melena, hematochezia, dysuria, hematuria, rash, seizure activity, orthopnea, PND, pedal edema, claudication. Remaining systems are negative.  Physical Exam: Well-developed well-nourished in no acute distress.  Skin is warm and dry.  HEENT is normal.  Neck is supple.  Chest is clear to auscultation with normal expansion.  Cardiovascular exam is regular rate and rhythm.  Abdominal exam nontender or distended. No masses palpated. Extremities show no edema. neuro grossly intact

## 2012-11-07 NOTE — Assessment & Plan Note (Signed)
Chest pain is intermittent and chronic. His nuclear study is low risk. I do not think pain is cardiac in nature. No further workup at this point.

## 2012-11-07 NOTE — Assessment & Plan Note (Signed)
Continue present medications. 

## 2012-11-07 NOTE — Patient Instructions (Addendum)
Your physician wants you to follow-up in: ONE YEAR WITH DR CRENSHAW You will receive a reminder letter in the mail two months in advance. If you don't receive a letter, please call our office to schedule the follow-up appointment.  

## 2012-11-07 NOTE — Assessment & Plan Note (Signed)
Continue statin. 

## 2012-11-08 ENCOUNTER — Telehealth: Payer: Self-pay | Admitting: Gastroenterology

## 2012-11-08 DIAGNOSIS — K219 Gastro-esophageal reflux disease without esophagitis: Secondary | ICD-10-CM

## 2012-11-08 NOTE — Telephone Encounter (Signed)
Patient rescheduled for endo/colon 11/13/12 3:00 LEC.  He verbalized understanding of instruction change

## 2012-11-08 NOTE — Telephone Encounter (Signed)
Patient reports that he is having nocturnal reflux and coughing, he would like to add EGD to colon scheduled for 11/13/12.  Dr. Jarold Motto is this ok?  I can reschedule him to the afternoon if you want to add this as there is no openings that am.  Please advise

## 2012-11-08 NOTE — Telephone Encounter (Signed)
ok 

## 2012-11-09 ENCOUNTER — Encounter: Payer: Self-pay | Admitting: Gastroenterology

## 2012-11-13 ENCOUNTER — Encounter: Payer: Self-pay | Admitting: Gastroenterology

## 2012-11-13 ENCOUNTER — Ambulatory Visit (AMBULATORY_SURGERY_CENTER): Payer: Medicare Other | Admitting: Gastroenterology

## 2012-11-13 ENCOUNTER — Other Ambulatory Visit: Payer: Self-pay | Admitting: Gastroenterology

## 2012-11-13 VITALS — BP 123/74 | HR 50 | Temp 97.6°F | Resp 0 | Ht 69.0 in | Wt 242.0 lb

## 2012-11-13 DIAGNOSIS — D126 Benign neoplasm of colon, unspecified: Secondary | ICD-10-CM

## 2012-11-13 DIAGNOSIS — D131 Benign neoplasm of stomach: Secondary | ICD-10-CM

## 2012-11-13 DIAGNOSIS — K317 Polyp of stomach and duodenum: Secondary | ICD-10-CM

## 2012-11-13 DIAGNOSIS — Z85048 Personal history of other malignant neoplasm of rectum, rectosigmoid junction, and anus: Secondary | ICD-10-CM

## 2012-11-13 DIAGNOSIS — K449 Diaphragmatic hernia without obstruction or gangrene: Secondary | ICD-10-CM

## 2012-11-13 DIAGNOSIS — R109 Unspecified abdominal pain: Secondary | ICD-10-CM

## 2012-11-13 DIAGNOSIS — Z1211 Encounter for screening for malignant neoplasm of colon: Secondary | ICD-10-CM

## 2012-11-13 DIAGNOSIS — K219 Gastro-esophageal reflux disease without esophagitis: Secondary | ICD-10-CM

## 2012-11-13 LAB — GLUCOSE, CAPILLARY
Glucose-Capillary: 81 mg/dL (ref 70–99)
Glucose-Capillary: 128 mg/dL — ABNORMAL HIGH (ref 70–99)

## 2012-11-13 MED ORDER — DEXTROSE 5 % IV SOLN: INTRAVENOUS | Status: DC

## 2012-11-13 NOTE — Patient Instructions (Addendum)

## 2012-11-13 NOTE — Op Note (Signed)
[facility] [address] [place], [address]   ENDOSCOPY PROCEDURE REPORT  PATIENT: Keonte, Daubenspeck  MR#: [id number] BIRTHDATE: [birth year] , 71  yrs. old GENDER: Male ENDOSCOPIST:David Hale Bogus, MD, [facility] REFERRED BY: PROCEDURE DATE:  11/13/2012 PROCEDURE:   EGD w/ biopsy ASA CLASS:    Class III INDICATIONS: Dyspepsia and history of esophageal reflux.  ..solid and liquid food dysphagia MEDICATION: There was residual sedation effect present from prior procedure and propofol (Diprivan) 150mg  IV TOPICAL ANESTHETIC:  DESCRIPTION OF PROCEDURE:   After the risks and benefits of the procedure were explained, informed consent was obtained.  The LB GIF-H180 [id number]  endoscope was introduced through the mouth  and advanced to the second portion of the duodenum .  The instrument was slowly withdrawn as the mucosa was fully examined.      DUODENUM: The duodenal mucosa showed no abnormalities in the bulb and second portion of the duodenum.  ESOPHAGUS: The mucosa of the esophagus appeared normal.  STOMACH: A fungating semi-pedunculated polyp ranging between 5-29mm in size was found on the greater curvature of the gastric body.  A biopsy was performed.  A large paraesophageal hernia noted with entrapment of portion of the stomach in this diaphragmatic hernia.  This caused very unusual anatomy.  There is no evidence of an esophageal obstruction otherwise, please see pictures for documentation.    Retroflexed views revealed paraesophageal hernia.    The scope was then withdrawn from the patient and the procedure completed.  COMPLICATIONS: There were no complications.   ENDOSCOPIC IMPRESSION: 1.   The duodenal mucosa showed no abnormalities in the bulb and second portion of the duodenum 2.   The mucosa of the esophagus appeared normal 3.   Semi-pedunculated polyp ranging between 5-51mm in size was found on the greater curvature of the gastric body 4.   A large  paraesophageal hernia noted with entrapment of portion of the stomach in this diaphragmatic hernia.  This caused very unusual anatomy.  There is no evidence of an esophageal obstruction otherwise, please see pictures for documentation.  RECOMMENDATIONS: My office will arrange for you to have a Barium Esophagram performed.  This is a radiology test to examine your esophagus.surgical correction will probably be indicated in this particular case because of the large nature of this hernia and has rather severe dysphagia.    _______________________________ eSigned:  Mardella Layman, MD, [facility] 11/13/2012 3:17 PM   standard discharge   PATIENT NAME:  Donatello, Kleve MR#: [id number]

## 2012-11-13 NOTE — Progress Notes (Signed)
Called to room to assist during endoscopic procedure.  Patient ID and intended procedure confirmed with present staff. Received instructions for my participation in the procedure from the performing physician.  

## 2012-11-13 NOTE — Op Note (Signed)
[facility] [address] [place], [address]   COLONOSCOPY PROCEDURE REPORT  PATIENT: Marcus [name], Marcus [name]  MR#: [id number] BIRTHDATE: 01-Mar-1941 , 71  yrs. old GENDER: Male ENDOSCOPIST: Mardella Layman, MD, [facility] REFERRED BY: PROCEDURE DATE:  11/13/2012 PROCEDURE:   Colonoscopy with biopsy ASA CLASS:   Class III INDICATIONS:Patient's personal history of adenomatous colon polyps and Colorectal cancer screening.  Prior sigmoid resection for colon CA. MEDICATIONS: propofol (Diprivan) 250mg  IV  DESCRIPTION OF PROCEDURE:   After the risks and benefits and of the procedure were explained, informed consent was obtained.  A digital rectal exam revealed no abnormalities of the rectum.    The LB PCF-H180AL [id number]  endoscope was introduced through the anus and advanced to the cecum, which was identified by both the appendix and ileocecal valve .  The quality of the prep was excellent, using MoviPrep .  The instrument was then slowly withdrawn as the colon was fully examined.     COLON FINDINGS: Multiple smooth flat polyps ranging between 3-47mm in size were found in the rectum.  Multiple biopsies of the area were performed using cold forceps.   There was evidence of a prior colorectal surgical anastomosis in the rectum.   The colon was otherwise normal.  There was no diverticulosis, inflammation, polyps or cancers unless previously stated.     Retroflexed views revealed no abnormalities.     The scope was then withdrawn from the patient and the procedure completed.  COMPLICATIONS: There were no complications. ENDOSCOPIC IMPRESSION: 1.   Multiple flat polyps ranging between 3-15mm in size were found in the rectum; multiple biopsies of the area were performed using cold forceps ..r/o adenomas 2.   There was evidence of a prior sigmoid colorectal surgical anastomosis in the rectum 3.   The colon was otherwise normal  RECOMMENDATIONS: 1.  Await pathology results 2.   Continue current medications 3.  Repeat Colonoscopy in 5 years. 4.  Upper endoscopy will be scheduled   REPEAT EXAM: cc Dr.Robert  Artist Pais cc:  _______________________________ eSigned:  Mardella Layman, MD, [facility] 11/13/2012 3:08 PM     PATIENT NAME:  Marcus [name], Marcus [name] MR#: [id number]

## 2012-11-13 NOTE — Progress Notes (Signed)
Patient did not have preoperative order for IV antibiotic SSI prophylaxis. (G8918)  Patient did not experience any of the following events: a burn prior to discharge; a fall within the facility; wrong site/side/patient/procedure/implant event; or a hospital transfer or hospital admission upon discharge from the facility. (G8907)  

## 2012-11-14 ENCOUNTER — Telehealth: Payer: Self-pay | Admitting: *Deleted

## 2012-11-14 DIAGNOSIS — R1314 Dysphagia, pharyngoesophageal phase: Secondary | ICD-10-CM

## 2012-11-14 NOTE — Telephone Encounter (Signed)
  Follow up Call-  Call back number 11/13/2012  Post procedure Call Back phone  # [phone]  Permission to leave phone message Yes     Patient questions:  Do you have a fever, pain , or abdominal swelling? no Pain Score  0 *  Have you tolerated food without any problems? yes  Have you been able to return to your normal activities? yes  Do you have any questions about your discharge instructions: Diet   no Medications  no Follow up visit  no  Do you have questions or concerns about your Care? no  Actions: * If pain score is 4 or above: No action needed, pain <4.

## 2012-11-14 NOTE — Telephone Encounter (Signed)
Notified pt of BS and UGI on 11/17/12; arrive [facility] at 0915am and nothing to eat or drink after midnight. Pt stated understanding.

## 2012-11-16 ENCOUNTER — Encounter: Payer: Self-pay | Admitting: Gastroenterology

## 2012-11-17 ENCOUNTER — Ambulatory Visit (HOSPITAL_COMMUNITY)
Admission: RE | Admit: 2012-11-17 | Discharge: 2012-11-17 | Disposition: A | Discharge status: Home / Self Care | Payer: Medicare Other | Source: Ambulatory Visit | Attending: Gastroenterology | Admitting: Gastroenterology

## 2012-11-17 ENCOUNTER — Other Ambulatory Visit: Payer: Self-pay | Admitting: *Deleted

## 2012-11-17 DIAGNOSIS — R1314 Dysphagia, pharyngoesophageal phase: Secondary | ICD-10-CM

## 2012-11-17 DIAGNOSIS — R933 Abnormal findings on diagnostic imaging of other parts of digestive tract: Secondary | ICD-10-CM | POA: Insufficient documentation

## 2012-11-17 DIAGNOSIS — K219 Gastro-esophageal reflux disease without esophagitis: Secondary | ICD-10-CM | POA: Insufficient documentation

## 2012-11-17 DIAGNOSIS — K224 Dyskinesia of esophagus: Secondary | ICD-10-CM | POA: Insufficient documentation

## 2012-11-17 DIAGNOSIS — K449 Diaphragmatic hernia without obstruction or gangrene: Secondary | ICD-10-CM | POA: Insufficient documentation

## 2012-11-17 MED ORDER — ESOMEPRAZOLE MAGNESIUM 40 MG PO CPDR: DELAYED_RELEASE_CAPSULE | ORAL | Status: DC

## 2012-12-28 ENCOUNTER — Encounter: Payer: Self-pay | Admitting: Internal Medicine

## 2013-02-05 ENCOUNTER — Telehealth: Payer: Self-pay | Admitting: Internal Medicine

## 2013-02-05 NOTE — Telephone Encounter (Signed)
Pt is calling and stating that he would like to see Dr. Artist Pais on Thursday 02/08/13. He states that he is congested and coughing. Right now he is in [place], [place], but will be returning Wednesday afternoon. There are no available appts with Dr. Artist Pais on Thursday, as it will exceed the 5 pt maximum. Please assist.

## 2013-02-05 NOTE — Telephone Encounter (Signed)
Ok to add to Thursday per Dr Artist Pais

## 2013-02-05 NOTE — Telephone Encounter (Signed)
Scheduled

## 2013-02-08 ENCOUNTER — Ambulatory Visit (INDEPENDENT_AMBULATORY_CARE_PROVIDER_SITE_OTHER): Payer: Medicare Other | Admitting: Internal Medicine

## 2013-02-08 ENCOUNTER — Other Ambulatory Visit: Payer: Self-pay | Admitting: Internal Medicine

## 2013-02-08 ENCOUNTER — Encounter: Payer: Self-pay | Admitting: Internal Medicine

## 2013-02-08 VITALS — BP 120/78 | HR 64 | Temp 98.2°F | Wt 249.0 lb

## 2013-02-08 DIAGNOSIS — I10 Essential (primary) hypertension: Secondary | ICD-10-CM

## 2013-02-08 DIAGNOSIS — R252 Cramp and spasm: Secondary | ICD-10-CM

## 2013-02-08 DIAGNOSIS — IMO0001 Reserved for inherently not codable concepts without codable children: Secondary | ICD-10-CM

## 2013-02-08 DIAGNOSIS — R7303 Prediabetes: Secondary | ICD-10-CM

## 2013-02-08 DIAGNOSIS — R7309 Other abnormal glucose: Secondary | ICD-10-CM

## 2013-02-08 DIAGNOSIS — R1110 Vomiting, unspecified: Secondary | ICD-10-CM

## 2013-02-08 LAB — BASIC METABOLIC PANEL
BUN: 21 mg/dL (ref 6–23)
CO2: 28 mEq/L (ref 19–32)
Calcium: 9 mg/dL (ref 8.4–10.5)
Chloride: 106 mEq/L (ref 96–112)
Creatinine, Ser: 1.2 mg/dL (ref 0.4–1.5)
GFR: 66.23 mL/min (ref >60.00)
Glucose, Bld: 91 mg/dL (ref 70–99)
Potassium: 4.1 mEq/L (ref 3.5–5.1)
Sodium: 141 mEq/L (ref 135–145)

## 2013-02-08 LAB — HEMOGLOBIN A1C: Hgb A1c MFr Bld: 5.9 % (ref 4.6–6.5)

## 2013-02-08 LAB — MAGNESIUM: Magnesium: 2.1 mg/dL (ref 1.5–2.5)

## 2013-02-08 NOTE — Assessment & Plan Note (Signed)
Monitor A1c.  Continue metformin. 

## 2013-02-08 NOTE — Progress Notes (Signed)
Subjective:    Patient ID: Marcus [name], male    DOB: 1941-03-07, 72 y.o.   MRN: [id number]  HPI  72 year old white male with history of hypertension, borderline type 2 diabetes and hyperlipidemia complains of intermittent nocturnal regurgitation. His symptoms have been ongoing for years. His symptoms worse with eating later in the evening (past 8 PM). He denies any associated abdominal pain, bloating or abnormal bowel movements during the day. No symptoms to suggest gastroparesis.  He senses fluid coming up from his esophagus while he is sleeping. He wakes up coughing but has never developed bronchitis or pneumonia. Patient has resorted to sleeping in a recliner when he hasn't eaten later in the evening.  Prediabetes - weight stable.    Review of Systems Negative for daytime symptoms.  No improvement with PPIs  Past Medical History  Diagnosis Date  . Unspecified vitamin D deficiency   . Calcaneal spur   . Ventral hernia, unspecified, without mention of obstruction or gangrene   . Other and unspecified hyperlipidemia   . External hemorrhoids without mention of complication   . Diverticulosis of colon (without mention of hemorrhage)   . Elevated prostate specific antigen (PSA)   . Gout, unspecified per pt stable  . Unspecified essential hypertension   . Personal history of malignant neoplasm of large intestine   . Personal history of colonic polyps 08/21/2009    TUBULAR ADENOMAS  . Lumbar spondylolysis   . Vitamin B12 deficiency   . Postgastric surgery syndromes   . Coronary artery disease cardiologist- dr Jens Som--  visit 01-08-11 in epic  . Normal nuclear stress test 04-23-2010    ef 51%,  septal hypokinesis, normal perfusion  . Echocardiogram findings abnormal, without diagnosis 04-23-2010    normal LV function, mod. left atrial enlargement, mild right artrial enlargement and grade 1 diastolic dysfunction  . Edema of lower extremity     ankles, elevates legs  . OSA  (obstructive sleep apnea) no cpap use due to recent lost 50 lbs  and mask does not fit   . Hiatal hernia   . Hernia, diaphragmatic, without obstruction   . Esophageal reflux controlled w/ prilosec  . Arthritis     generalized  . Acute meniscal tear of knee left  . Frequency of urination     followed by dr dalhstedt  . Diabetes mellitus oral med    History   Social History  . Marital Status: Married    Spouse Name: N/A    Number of Children: N/A  . Years of Education: N/A   Occupational History  . semi retired    Social History Main Topics  . Smoking status: Former Smoker    Quit date: 07/08/1980  . Smokeless tobacco: Never Used  . Alcohol Use: No     Comment: currently no alcoholic beveraages (on a diet)  . Drug Use: No  . Sexually Active: Not on file   Other Topics Concern  . Not on file   Social History Narrative     Married   Former Smoker    Alcohol use-yes 1-2 drinks per day      Occupation: Associate Professor       Past Surgical History  Procedure Laterality Date  . Vertical banded gastroplasty  1986  . Knee surgery      left  . Shoulder arthroscopy  05-23-2007    RIGHT  . Laminectomy and microdiscectomy lumbar spine  MARCH  2008    L3 -  4  .  Knee arthroscopy  2007    RIGHT  . Sigmoid colectomy for cancer  1989  . Cataract extraction w/ intraocular lens  implant, bilateral    . Laparoscopic incisional / umbilical / ventral hernia repair  2006  . Knee arthroscopy  07/13/2011    Procedure: ARTHROSCOPY KNEE;  Surgeon: Erasmo Leventhal;  Location: [facility];  Service: Orthopedics;  Laterality: Left;  partial menisectomy with chondrylplasty    Family History  Problem Relation Age of Onset  . Stroke Father   . Esophagitis Father     died from perforated esophagus  . Breast cancer Mother   . Heart disease Paternal Grandfather   . Colon cancer Maternal Grandmother     questionable    Allergies  Allergen Reactions  . Contrast Media  (Iodinated Diagnostic Agents) Palpitations    TACHYCARDIA    Current Outpatient Prescriptions on File Prior to Visit  Medication Sig Dispense Refill  . ipratropium (ATROVENT) 0.03 % nasal spray Place 2 sprays into the nose at bedtime.      [name] Kitchen omeprazole (PRILOSEC) 40 MG capsule Take 40 mg by mouth daily before breakfast.      . Potassium Chloride (KCL-20 PO) Take 1 tablet by mouth as needed.      . cyanocobalamin (,VITAMIN B-12,) 1000 MCG/ML injection Inject 1,000 mcg into the muscle every 30 (thirty) days.       No current facility-administered medications on file prior to visit.    BP 120/78  Pulse 64  Temp(Src) 98.2 F (36.8 C) (Oral)  Wt 249 lb (112.946 kg)  BMI 36.75 kg/m2       Objective:   Physical Exam  Constitutional: He appears well-developed and well-nourished.  HENT:  Head: Normocephalic and atraumatic.  Right Ear: External ear normal.  Mouth/Throat: Oropharynx is clear and moist.  Neck: Neck supple.  No carotid bruit  Cardiovascular: Normal rate, regular rhythm, normal heart sounds and intact distal pulses.   Pulmonary/Chest: Effort normal and breath sounds normal. He has no wheezes. He exhibits no tenderness.  Abdominal: He exhibits no mass.  Obese, nontender, healed midline incision from xiphoid to below umbilicus  Musculoskeletal:  Trace lower extremity edema bilaterally  Lymphadenopathy:    He has no cervical adenopathy.  Skin: Skin is warm and dry.  Psychiatric: He has a normal mood and affect. His behavior is normal.          Assessment & Plan:

## 2013-02-08 NOTE — Assessment & Plan Note (Signed)
72 year old white male complains of nocturnal regurgitation. We discussed surgical options including Nissen fundoplication. He declines for now. Patient will work on weight loss and raising head of bed to at least 30 to avoid nocturnal aspiration. Patient also agrees to try eating smaller evening meals.

## 2013-02-08 NOTE — Assessment & Plan Note (Signed)
Well controlled.  He is experiencing intermittent cramps. Patient advised to continue potassium supplementation as well as start magnesium supplementation. Monitor electrolytes and kidney function. BP: 120/78 mmHg

## 2013-02-08 NOTE — Patient Instructions (Addendum)
Please work towards 20-25 lb weight loss over next 6 - 12 months. Please complete the following lab tests before your next follow up appointment: BMET - 401.9 A1c - 790.29 B12 level - 782.0 Please take over the counter sublingual B12 supplement 3-4 times per week

## 2013-02-13 LAB — VITAMIN D 25 HYDROXY (VIT D DEFICIENCY, FRACTURES): Vit D, 25-Hydroxy: 22 ng/mL — ABNORMAL LOW (ref 30–89)

## 2013-02-26 ENCOUNTER — Encounter: Payer: Self-pay | Admitting: Internal Medicine

## 2013-02-26 ENCOUNTER — Ambulatory Visit (INDEPENDENT_AMBULATORY_CARE_PROVIDER_SITE_OTHER)
Admission: RE | Admit: 2013-02-26 | Discharge: 2013-02-26 | Disposition: A | Discharge status: Home / Self Care | Payer: Medicare Other | Source: Ambulatory Visit | Attending: Internal Medicine | Admitting: Internal Medicine

## 2013-02-26 ENCOUNTER — Ambulatory Visit (INDEPENDENT_AMBULATORY_CARE_PROVIDER_SITE_OTHER): Payer: Medicare Other | Admitting: Internal Medicine

## 2013-02-26 VITALS — BP 144/80 | Temp 98.9°F | Wt 250.0 lb

## 2013-02-26 DIAGNOSIS — R079 Chest pain, unspecified: Secondary | ICD-10-CM

## 2013-02-26 DIAGNOSIS — R1110 Vomiting, unspecified: Secondary | ICD-10-CM

## 2013-02-26 DIAGNOSIS — R059 Cough, unspecified: Secondary | ICD-10-CM

## 2013-02-26 DIAGNOSIS — R05 Cough: Secondary | ICD-10-CM

## 2013-02-26 DIAGNOSIS — J40 Bronchitis, not specified as acute or chronic: Secondary | ICD-10-CM | POA: Insufficient documentation

## 2013-02-26 DIAGNOSIS — IMO0001 Reserved for inherently not codable concepts without codable children: Secondary | ICD-10-CM

## 2013-02-26 MED ORDER — AZITHROMYCIN 250 MG PO TABS: ORAL_TABLET | ORAL | Status: DC

## 2013-02-26 MED ORDER — CEFUROXIME AXETIL 500 MG PO TABS: 500.0000 mg | ORAL_TABLET | Freq: Two times a day (BID) | ORAL | Status: AC

## 2013-02-26 NOTE — Assessment & Plan Note (Signed)
Patient encouraged to obtain reclining adjustable bed to raise head of bed 30 degrees to help prevent aspiration.

## 2013-02-26 NOTE — Assessment & Plan Note (Signed)
Patient still experiences intermittent nonexertional left upper chest pain. Review chest x-ray. Consider obtaining CT of chest.

## 2013-02-26 NOTE — Progress Notes (Signed)
Subjective:    Patient ID: Marcus [name], male    DOB: 09-29-1940, 72 y.o.   MRN: [id number]  HPI  72 year old white male previously seen for nocturnal regurgitation symptoms complains of productive cough for one week. He reports regurgitation episodes have decreased in frequency. However, his last occurrence was 1 to 2 weeks ago. Patient reports coughing up thick discolored mucus. He also reports severe fatigue. He denies any shortness of breath or fever.  Patient noted to have chronic sinus congestion and uses over the counter Afrin as needed.  Patient sleeping with head of bed raised as best as he can manage.  He has not obtained "hospital bed" where he can raise head of his bed.  Review of Systems Negative for fever or chills.  He reports intermittent left upper chest pain (previously evaluated by cardiology).  Negative stress test.  Past Medical History  Diagnosis Date  . Unspecified vitamin D deficiency   . Calcaneal spur   . Ventral hernia, unspecified, without mention of obstruction or gangrene   . Other and unspecified hyperlipidemia   . External hemorrhoids without mention of complication   . Diverticulosis of colon (without mention of hemorrhage)   . Elevated prostate specific antigen (PSA)   . Gout, unspecified per pt stable  . Unspecified essential hypertension   . Personal history of malignant neoplasm of large intestine   . Personal history of colonic polyps 08/21/2009    TUBULAR ADENOMAS  . Lumbar spondylolysis   . Vitamin B12 deficiency   . Postgastric surgery syndromes   . Coronary artery disease cardiologist- dr Jens Som--  visit 01-08-11 in epic  . Normal nuclear stress test 04-23-2010    ef 51%,  septal hypokinesis, normal perfusion  . Echocardiogram findings abnormal, without diagnosis 04-23-2010    normal LV function, mod. left atrial enlargement, mild right artrial enlargement and grade 1 diastolic dysfunction  . Edema of lower extremity     ankles,  elevates legs  . OSA (obstructive sleep apnea) no cpap use due to recent lost 50 lbs  and mask does not fit   . Hiatal hernia   . Hernia, diaphragmatic, without obstruction   . Esophageal reflux controlled w/ prilosec  . Arthritis     generalized  . Acute meniscal tear of knee left  . Frequency of urination     followed by dr dalhstedt  . Diabetes mellitus oral med    History   Social History  . Marital Status: Married    Spouse Name: N/A    Number of Children: N/A  . Years of Education: N/A   Occupational History  . semi retired    Social History Main Topics  . Smoking status: Former Smoker    Quit date: 07/08/1980  . Smokeless tobacco: Never Used  . Alcohol Use: No     Comment: currently no alcoholic beveraages (on a diet)  . Drug Use: No  . Sexual Activity: Not on file   Other Topics Concern  . Not on file   Social History Narrative     Married   Former Smoker    Alcohol use-yes 1-2 drinks per day      Occupation: Associate Professor       Past Surgical History  Procedure Laterality Date  . Vertical banded gastroplasty  1986  . Knee surgery      left  . Shoulder arthroscopy  05-23-2007    RIGHT  . Laminectomy and [facility]discectomy lumbar spine  [facility]  2008  L3 -  4  . Knee arthroscopy  2007    RIGHT  . Sigmoid colectomy for cancer  1989  . Cataract extraction w/ intraocular lens  implant, bilateral    . Laparoscopic incisional / umbilical / ventral hernia repair  2006  . Knee arthroscopy  07/13/2011    Procedure: ARTHROSCOPY KNEE;  Surgeon: Erasmo Leventhal;  Location: [facility];  Service: Orthopedics;  Laterality: Left;  partial menisectomy with chondrylplasty    Family History  Problem Relation Age of Onset  . Stroke Father   . Esophagitis Father     died from perforated esophagus  . Breast cancer Mother   . Heart disease Paternal Grandfather   . Colon cancer Maternal Grandmother     questionable    Allergies  Allergen  Reactions  . Contrast Media [Iodinated Diagnostic Agents] Palpitations    TACHYCARDIA    Current Outpatient Prescriptions on File Prior to Visit  Medication Sig Dispense Refill  . allopurinol (ZYLOPRIM) 100 MG tablet Take one tablet by mouth one time daily  90 tablet  0  . amLODipine (NORVASC) 5 MG tablet TAKE ONE TABLET BY MOUTH ONE TIME DAILY   90 tablet  0  . cyanocobalamin (,VITAMIN B-12,) 1000 MCG/ML injection Inject 1,000 mcg into the muscle every 30 (thirty) days.      [name] Kitchen ipratropium (ATROVENT) 0.03 % nasal spray Place 2 sprays into the nose at bedtime.      . metFORMIN (GLUCOPHAGE) 500 MG tablet TAKE ONE TABLET BY MOUTH TWICE DAILY   180 tablet  0  . omeprazole (PRILOSEC) 40 MG capsule Take 40 mg by mouth daily before breakfast.      . Potassium Chloride (KCL-20 PO) Take 1 tablet by mouth as needed.      . simvastatin (ZOCOR) 20 MG tablet TAKE ONE TABLET BY MOUTH NIGHTLY AT BEDTIME   90 tablet  0  . valsartan-hydrochlorothiazide (DIOVAN-HCT) 320-12.5 MG per tablet TAKE ONE TABLET BY MOUTH ONE TIME DAILY   90 tablet  0   No current facility-administered medications on file prior to visit.    BP 144/80  Temp(Src) 98.9 F (37.2 C) (Oral)  Wt 250 lb (113.399 kg)  BMI 36.9 kg/m2       Objective:   Physical Exam  Constitutional: He is oriented to person, place, and time. He appears well-developed and well-nourished.  HENT:  Head: Normocephalic and atraumatic.  Right Ear: External ear normal.  Left Ear: External ear normal.  Mouth/Throat: No oropharyngeal exudate.  Mild oropharyngeal erythema  Neck: Neck supple.  No neck tenderness  Cardiovascular: Normal rate, regular rhythm and normal heart sounds.   Pulmonary/Chest: Effort normal and breath sounds normal. He has no wheezes.  Abdominal: Soft. Bowel sounds are normal.  obese  Musculoskeletal:  Trace lower extremity edema bilaterally  Lymphadenopathy:    He has no cervical adenopathy.  Neurological: He is alert and  oriented to person, place, and time. No cranial nerve deficit.  Skin: Skin is warm and dry.  Psychiatric: He has a normal mood and affect. His behavior is normal.          Assessment & Plan:

## 2013-02-26 NOTE — Assessment & Plan Note (Signed)
72 year old white male with history of intermittent nocturnal regurgitation complains of productive cough. He has at high risk for aspiration pneumonia. Lung exam is unremarkable. He has mildly decreased oxygen saturation levels. (95%) Obtain chest x-ray. Treat with cefuroxime 500 mg twice daily for 10 days and azithromycin for 5 days. Reassess in 2 weeks.  Patient advised to call office if symptoms persist or worsen.

## 2013-02-26 NOTE — Patient Instructions (Addendum)
Please contact our office if your symptoms do not improve or gets worse.  

## 2013-02-27 ENCOUNTER — Telehealth: Payer: Self-pay | Admitting: Internal Medicine

## 2013-02-27 NOTE — Telephone Encounter (Signed)
See result note.  

## 2013-02-27 NOTE — Telephone Encounter (Signed)
Pt waiting on results of xray yesterday. pls call

## 2013-03-12 ENCOUNTER — Ambulatory Visit (INDEPENDENT_AMBULATORY_CARE_PROVIDER_SITE_OTHER): Payer: Medicare Other | Admitting: Internal Medicine

## 2013-03-12 ENCOUNTER — Encounter: Payer: Self-pay | Admitting: Internal Medicine

## 2013-03-12 VITALS — BP 140/82 | Temp 98.4°F | Wt 249.0 lb

## 2013-03-12 DIAGNOSIS — J40 Bronchitis, not specified as acute or chronic: Secondary | ICD-10-CM

## 2013-03-12 DIAGNOSIS — M1710 Unilateral primary osteoarthritis, unspecified knee: Secondary | ICD-10-CM

## 2013-03-12 DIAGNOSIS — IMO0002 Reserved for concepts with insufficient information to code with codable children: Secondary | ICD-10-CM

## 2013-03-12 DIAGNOSIS — M1712 Unilateral primary osteoarthritis, left knee: Secondary | ICD-10-CM

## 2013-03-12 MED ORDER — AMLODIPINE BESYLATE 5 MG PO TABS: 5.0000 mg | ORAL_TABLET | Freq: Every day | ORAL | Status: DC

## 2013-03-12 MED ORDER — SIMVASTATIN 20 MG PO TABS: 20.0000 mg | ORAL_TABLET | Freq: Every day | ORAL | Status: DC

## 2013-03-12 MED ORDER — METFORMIN HCL 500 MG PO TABS: 500.0000 mg | ORAL_TABLET | Freq: Two times a day (BID) | ORAL | Status: DC

## 2013-03-12 MED ORDER — ALLOPURINOL 100 MG PO TABS: 100.0000 mg | ORAL_TABLET | Freq: Every day | ORAL | Status: DC

## 2013-03-12 NOTE — Progress Notes (Signed)
Subjective:    Patient ID: Marcus [name], male    DOB: [birth year], 72 y.o.   MRN: [id number]  HPI  72 year old white male for followup regarding acute bronchitis. Patient finished course of cefuroxime and azithromycin. He denies any side effects including diarrhea. His cough has completely resolved. His left-sided chest pain also much improved. He has not had any symptoms for at least a week.  Patient trying his best to eat early in the evening. He occasionally sleeps in his recliner. He has not obtained "hospital bed" yet.  Patient followed by Dr. Thomasena Edis for chronic left knee pain. He has received multiple cortisone injections and injections of Synvisc. He is considering left knee replacement.  Review of Systems Negative for chest pain or shortness of breath  Past Medical History  Diagnosis Date  . Unspecified vitamin D deficiency   . Calcaneal spur   . Ventral hernia, unspecified, without mention of obstruction or gangrene   . Other and unspecified hyperlipidemia   . External hemorrhoids without mention of complication   . Diverticulosis of colon (without mention of hemorrhage)   . Elevated prostate specific antigen (PSA)   . Gout, unspecified per pt stable  . Unspecified essential hypertension   . Personal history of malignant neoplasm of large intestine   . Personal history of colonic polyps 08/21/2009    TUBULAR ADENOMAS  . Lumbar spondylolysis   . Vitamin B12 deficiency   . Postgastric surgery syndromes   . Coronary artery disease cardiologist- dr Jens Som--  visit 01-08-11 in epic  . Normal nuclear stress test 04-23-2010    ef 51%,  septal hypokinesis, normal perfusion  . Echocardiogram findings abnormal, without diagnosis 04-23-2010    normal LV function, mod. left atrial enlargement, mild right artrial enlargement and grade 1 diastolic dysfunction  . Edema of lower extremity     ankles, elevates legs  . OSA (obstructive sleep apnea) no cpap use due to recent lost 50  lbs  and mask does not fit   . Hiatal hernia   . Hernia, diaphragmatic, without obstruction   . Esophageal reflux controlled w/ prilosec  . Arthritis     generalized  . Acute meniscal tear of knee left  . Frequency of urination     followed by dr dalhstedt  . Diabetes mellitus oral med    History   Social History  . Marital Status: Married    Spouse Name: N/A    Number of Children: N/A  . Years of Education: N/A   Occupational History  . semi retired    Social History Main Topics  . Smoking status: Former Smoker    Quit date: 07/08/1980  . Smokeless tobacco: Never Used  . Alcohol Use: No     Comment: currently no alcoholic beveraages (on a diet)  . Drug Use: No  . Sexual Activity: Not on file   Other Topics Concern  . Not on file   Social History Narrative     Married   Former Smoker    Alcohol use-yes 1-2 drinks per day      Occupation: Associate Professor       Past Surgical History  Procedure Laterality Date  . Vertical banded gastroplasty  1986  . Knee surgery      left  . Shoulder arthroscopy  05-23-2007    RIGHT  . Laminectomy and microdiscectomy lumbar spine  MARCH  2008    L3 -  4  . Knee arthroscopy  2007  RIGHT  . Sigmoid colectomy for cancer  1989  . Cataract extraction w/ intraocular lens  implant, bilateral    . Laparoscopic incisional / umbilical / ventral hernia repair  2006  . Knee arthroscopy  07/13/2011    Procedure: ARTHROSCOPY KNEE;  Surgeon: Erasmo Leventhal;  Location: [facility];  Service: Orthopedics;  Laterality: Left;  partial menisectomy with chondrylplasty    Family History  Problem Relation Age of Onset  . Stroke Father   . Esophagitis Father     died from perforated esophagus  . Breast cancer Mother   . Heart disease Paternal Grandfather   . Colon cancer Maternal Grandmother     questionable    Allergies  Allergen Reactions  . Contrast Media [Iodinated Diagnostic Agents] Palpitations    TACHYCARDIA     Current Outpatient Prescriptions on File Prior to Visit  Medication Sig Dispense Refill  . Cholecalciferol (VITAMIN D) 2000 UNITS CAPS Take 1 capsule by mouth daily.      . cyanocobalamin (,VITAMIN B-12,) 1000 MCG/ML injection Inject 1,000 mcg into the muscle every 30 (thirty) days.      [name] Kitchen ipratropium (ATROVENT) 0.03 % nasal spray Place 2 sprays into the nose at bedtime.      . magnesium 30 MG tablet Take 30 mg by mouth daily.      [name] Kitchen omeprazole (PRILOSEC) 40 MG capsule Take 40 mg by mouth daily before breakfast.      . Potassium Chloride (KCL-20 PO) Take 1 tablet by mouth as needed.      . valsartan-hydrochlorothiazide (DIOVAN-HCT) 320-12.5 MG per tablet TAKE ONE TABLET BY MOUTH ONE TIME DAILY   90 tablet  0   No current facility-administered medications on file prior to visit.    BP 140/82  Temp(Src) 98.4 F (36.9 C) (Oral)  Wt 249 lb (112.946 kg)  BMI 36.75 kg/m2       Objective:   Physical Exam  Constitutional: He is oriented to person, place, and time. He appears well-developed and well-nourished.  Cardiovascular: Normal rate, regular rhythm and normal heart sounds.   No murmur heard. Pulmonary/Chest: Effort normal and breath sounds normal. He has no wheezes. He exhibits no tenderness.  Musculoskeletal:  Left knee tenderness  Neurological: He is oriented to person, place, and time. No cranial nerve deficit.  Psychiatric: He has a normal mood and affect.          Assessment & Plan:

## 2013-03-12 NOTE — Assessment & Plan Note (Signed)
Patient followed by Dr. Thomasena Edis. He is having persistent pain despite multiple cortisone injections. He also has received a round of Synvisc injections. Patient to followup with his orthopedist to consider left knee replacement. Patient encouraged to arrange office visit for medical clearance before left knee surgery.

## 2013-03-12 NOTE — Patient Instructions (Addendum)
Follow up with Dr. Thomasena Edis as directed Contact our office if your cough or left sided chest pain recurs Please complete the following lab tests before your next follow up appointment: BMET, A1c - 401.9, 790.29

## 2013-03-12 NOTE — Assessment & Plan Note (Signed)
Chest x-ray showed small right upper lobe infiltrate. He was successfully treated with cefuroxime and azithromycin as outpatient. His symptoms completely resolved. He denies left upper chest pain. Consider repeat chest x-ray at next office visit.

## 2013-05-15 ENCOUNTER — Other Ambulatory Visit: Payer: Self-pay | Admitting: Internal Medicine

## 2013-07-26 ENCOUNTER — Encounter: Payer: Self-pay | Admitting: Internal Medicine

## 2013-07-26 ENCOUNTER — Ambulatory Visit (INDEPENDENT_AMBULATORY_CARE_PROVIDER_SITE_OTHER): Payer: Medicare Other | Admitting: Internal Medicine

## 2013-07-26 VITALS — BP 130/80 | HR 73 | Temp 98.7°F | Resp 20 | Ht 69.0 in | Wt 251.0 lb

## 2013-07-26 DIAGNOSIS — M109 Gout, unspecified: Secondary | ICD-10-CM

## 2013-07-26 DIAGNOSIS — I10 Essential (primary) hypertension: Secondary | ICD-10-CM

## 2013-07-26 MED ORDER — MEPERIDINE HCL 50 MG/ML IJ SOLN
50.0000 mg | Freq: Once | INTRAMUSCULAR | Status: AC
  Administered 2013-07-26: 50 mg via INTRAMUSCULAR

## 2013-07-26 MED ORDER — ALLOPURINOL 300 MG PO TABS
300.0000 mg | ORAL_TABLET | Freq: Every day | ORAL | Status: DC
Start: 2013-07-26 — End: 2013-10-26

## 2013-07-26 MED ORDER — HYDROCODONE-ACETAMINOPHEN 5-325 MG PO TABS: 1.0000 | ORAL_TABLET | Freq: Four times a day (QID) | ORAL | Status: DC | PRN

## 2013-07-26 MED ORDER — PREDNISONE 20 MG PO TABS: ORAL_TABLET | ORAL | Status: DC

## 2013-07-26 NOTE — Patient Instructions (Signed)
Gout Gout is an inflammatory arthritis caused by a buildup of uric acid crystals in the joints. Uric acid is a chemical that is normally present in the blood. When the level of uric acid in the blood is too high it can form crystals that deposit in your joints and tissues. This causes joint redness, soreness, and swelling (inflammation). Repeat attacks are common. Over time, uric acid crystals can form into masses (tophi) near a joint, destroying bone and causing disfigurement. Gout is treatable and often preventable. CAUSES  The disease begins with elevated levels of uric acid in the blood. Uric acid is produced by your body when it breaks down a naturally found substance called purines. Certain foods you eat, such as meats and fish, contain high amounts of purines. Causes of an elevated uric acid level include:  Being passed down from parent to child (heredity).  Diseases that cause increased uric acid production (such as obesity, psoriasis, and certain cancers).  Excessive alcohol use.  Diet, especially diets rich in meat and seafood.  Medicines, including certain cancer-fighting medicines (chemotherapy), water pills (diuretics), and aspirin.  Chronic kidney disease. The kidneys are no longer able to remove uric acid well.  Problems with metabolism. Conditions strongly associated with gout include:  Obesity.  High blood pressure.  High cholesterol.  Diabetes. Not everyone with elevated uric acid levels gets gout. It is not understood why some people get gout and others do not. Surgery, joint injury, and eating too much of certain foods are some of the factors that can lead to gout attacks. SYMPTOMS   An attack of gout comes on quickly. It causes intense pain with redness, swelling, and warmth in a joint.  Fever can occur.  Often, only one joint is involved. Certain joints are more commonly involved:  Base of the big toe.  Knee.  Ankle.  Wrist.  Finger. Without  treatment, an attack usually goes away in a few days to weeks. Between attacks, you usually will not have symptoms, which is different from many other forms of arthritis. DIAGNOSIS  Your caregiver will suspect gout based on your symptoms and exam. In some cases, tests may be recommended. The tests may include:  Blood tests.  Urine tests.  X-rays.  Joint fluid exam. This exam requires a needle to remove fluid from the joint (arthrocentesis). Using a microscope, gout is confirmed when uric acid crystals are seen in the joint fluid. TREATMENT  There are two phases to gout treatment: treating the sudden onset (acute) attack and preventing attacks (prophylaxis).  Treatment of an Acute Attack.  Medicines are used. These include anti-inflammatory medicines or steroid medicines.  An injection of steroid medicine into the affected joint is sometimes necessary.  The painful joint is rested. Movement can worsen the arthritis.  You may use warm or cold treatments on painful joints, depending which works best for you.  Treatment to Prevent Attacks.  If you suffer from frequent gout attacks, your caregiver may advise preventive medicine. These medicines are started after the acute attack subsides. These medicines either help your kidneys eliminate uric acid from your body or decrease your uric acid production. You may need to stay on these medicines for a very long time.  The early phase of treatment with preventive medicine can be associated with an increase in acute gout attacks. For this reason, during the first few months of treatment, your caregiver may also advise you to take medicines usually used for acute gout treatment. Be sure you   understand your caregiver's directions. Your caregiver may make several adjustments to your medicine dose before these medicines are effective. °· Discuss dietary treatment with your caregiver or dietitian. Alcohol and drinks high in sugar and fructose and foods  such as meat, poultry, and seafood can increase uric acid levels. Your caregiver or dietician can advise you on drinks and foods that should be limited. °HOME CARE INSTRUCTIONS  °· Do not take aspirin to relieve pain. This raises uric acid levels. °· Only take over-the-counter or prescription medicines for pain, discomfort, or fever as directed by your caregiver. °· Rest the joint as much as possible. When in bed, keep sheets and blankets off painful areas. °· Keep the affected joint raised (elevated). °· Apply warm or cold treatments to painful joints. Use of warm or cold treatments depends on which works best for you. °· Use crutches if the painful joint is in your leg. °· Drink enough fluids to keep your urine clear or pale yellow. This helps your body get rid of uric acid. Limit alcohol, sugary drinks, and fructose drinks. °· Follow your dietary instructions. Pay careful attention to the amount of protein you eat. Your daily diet should emphasize fruits, vegetables, whole grains, and fat-free or low-fat milk products. Discuss the use of coffee, vitamin C, and cherries with your caregiver or dietician. These may be helpful in lowering uric acid levels. °· Maintain a healthy body weight. °SEEK MEDICAL CARE IF:  °· You develop diarrhea, vomiting, or any side effects from medicines. °· You do not feel better in 24 hours, or you are getting worse. °SEEK IMMEDIATE MEDICAL CARE IF:  °· Your joint becomes suddenly more tender, and you have chills or a fever. °MAKE SURE YOU:  °· Understand these instructions. °· Will watch your condition. °· Will get help right away if you are not doing well or get worse. °Document Released: 06/18/2000 Document Revised: 10/16/2012 Document Reviewed: 02/02/2012 °ExitCare® Patient Information ©2014 [employer]. ° °Purine Restricted Diet °A low-purine diet consists of foods that reduce uric acid made in your body. °INDICATIONS FOR USE  °Your caregiver may ask you to follow a low-purine  diet to reduce gout flairs.  °GUIDELINES  °Avoid high-purine foods, including all alcohol, yeast extracts taken as supplements, and sauces made from meats (like gravy). Do not eat high-purine meats, including anchovies, sardines, herring, mussels, tuna, codfish, scallops, trout, haddock, bacon, organ meats, tripe, goose, wild game, and sweetbreads.  °Grains °· Allowed/Recommended: All, except those listed to consume in moderation. °· Consume in Moderation: Oatmeal ( cup uncooked daily), wheat bran or germ (¼ cup daily), and whole grains. °Vegetables °· Allowed/Recommended: All, except those listed to consume in moderation. °· Consume in Moderation: Asparagus, cauliflower, spinach, mushrooms, and green peas (½ cup daily). °Fruit °· Allowed/Recommended: All. °· Consume in Moderation: None. °Meat and Meat Substitutes °· Allowed/Recommended: Eggs, nuts, and peanut butter. °· Consume in Moderation: Limit to 4 to 6 oz daily. Avoid high-purine meats. Lentils, peas, and dried beans (1 cup daily). °Milk °· Allowed/Recommended: All. Choose low-fat or skim when possible. °· Consume in Moderation: None. °Fats and Oils °· Allowed/Recommended: All. °· Consume in Moderation: None. °Beverages °· Allowed/Recommended: All, except those listed to avoid. °· Avoid: All alcohol. °Condiments/Miscellaneous °· Allowed/Recommended: All, except those listed to consume in moderation. °· Consume in Moderation: Bouillon and meat-based broths and soups. °Document Released: 10/16/2010 Document Revised: 09/13/2011 Document Reviewed: 10/16/2010 °[employer]® Patient Information ©2014 [employer]. ° °

## 2013-07-26 NOTE — Progress Notes (Signed)
Pre-visit discussion using our clinic review tool. No additional management support is needed unless otherwise documented below in the visit note.  

## 2013-07-26 NOTE — Progress Notes (Signed)
Subjective:    Patient ID: Marcus [name], male    DOB: 1940/09/11, 73 y.o.   MRN: [id number]  HPI  73 year old patient who was seen by Dr. Daylene Katayama  on January 14.  At that time he was treated for acute gout and arthritis involving the left wrist. He does have a history of gout and has been on allopurinol 100 mg daily uric acid level was still slightly elevated at 6.4. Gouty  arthritis responded very promptly to prednisone 20 mg daily within 24 hours.  More recently he has had a flareup of severe left wrist pain and swelling.  Past Medical History  Diagnosis Date  . Unspecified vitamin D deficiency   . Calcaneal spur   . Ventral hernia, unspecified, without mention of obstruction or gangrene   . Other and unspecified hyperlipidemia   . External hemorrhoids without mention of complication   . Diverticulosis of colon (without mention of hemorrhage)   . Elevated prostate specific antigen (PSA)   . Gout, unspecified per pt stable  . Unspecified essential hypertension   . Personal history of malignant neoplasm of large intestine   . Personal history of colonic polyps 08/21/2009    TUBULAR ADENOMAS  . Lumbar spondylolysis   . Vitamin B12 deficiency   . Postgastric surgery syndromes   . Coronary artery disease cardiologist- dr Stanford Breed--  visit 01-08-11 in epic  . Normal nuclear stress test 04-23-2010    ef 51%,  septal hypokinesis, normal perfusion  . Echocardiogram findings abnormal, without diagnosis 04-23-2010    normal LV function, mod. left atrial enlargement, mild right artrial enlargement and grade 1 diastolic dysfunction  . Edema of lower extremity     ankles, elevates legs  . OSA (obstructive sleep apnea) no cpap use due to recent lost 50 lbs  and mask does not fit   . Hiatal hernia   . Hernia, diaphragmatic, without obstruction   . Esophageal reflux controlled w/ prilosec  . Arthritis     generalized  . Acute meniscal tear of knee left  . Frequency of urination     followed  by dr dalhstedt  . Diabetes mellitus oral med    History   Social History  . Marital Status: Married    Spouse Name: N/A    Number of Children: N/A  . Years of Education: N/A   Occupational History  . semi retired    Social History Main Topics  . Smoking status: Former Smoker    Quit date: 07/08/1980  . Smokeless tobacco: Never Used  . Alcohol Use: No     Comment: currently no alcoholic beveraages (on a diet)  . Drug Use: No  . Sexual Activity: Not on file   Other Topics Concern  . Not on file   Social History Narrative     Married   Former Smoker    Alcohol use-yes 1-2 drinks per day      Occupation: Midwife       Past Surgical History  Procedure Laterality Date  . Vertical banded gastroplasty  1986  . Knee surgery      left  . Shoulder arthroscopy  05-23-2007    RIGHT  . Laminectomy and microdiscectomy lumbar spine  MARCH  2008    L3 -  4  . Knee arthroscopy  2007    RIGHT  . Sigmoid colectomy for cancer  1989  . Cataract extraction w/ intraocular lens  implant, bilateral    . Laparoscopic incisional /  umbilical / ventral hernia repair  2006  . Knee arthroscopy  07/13/2011    Procedure: ARTHROSCOPY KNEE;  Surgeon: Cynda Familia;  Location: [facility];  Service: Orthopedics;  Laterality: Left;  partial menisectomy with chondrylplasty    Family History  Problem Relation Age of Onset  . Stroke Father   . Esophagitis Father     died from perforated esophagus  . Breast cancer Mother   . Heart disease Paternal Grandfather   . Colon cancer Maternal Grandmother     questionable    Allergies  Allergen Reactions  . Contrast Media [Iodinated Diagnostic Agents] Palpitations    TACHYCARDIA    Current Outpatient Prescriptions on File Prior to Visit  Medication Sig Dispense Refill  . allopurinol (ZYLOPRIM) 100 MG tablet Take 1 tablet (100 mg total) by mouth daily.  90 tablet  1  . amLODipine (NORVASC) 5 MG tablet Take 1 tablet (5 mg  total) by mouth daily.  90 tablet  1  . Cholecalciferol (VITAMIN D) 2000 UNITS CAPS Take 1 capsule by mouth daily.      . magnesium 30 MG tablet Take 30 mg by mouth daily.      . metFORMIN (GLUCOPHAGE) 500 MG tablet Take 1 tablet (500 mg total) by mouth 2 (two) times daily with a meal.  180 tablet  1  . omeprazole (PRILOSEC) 40 MG capsule Take one capsule every day before meal  90 capsule  1  . Potassium Chloride (KCL-20 PO) Take 1 tablet by mouth as needed.      . simvastatin (ZOCOR) 20 MG tablet Take 1 tablet (20 mg total) by mouth at bedtime.  90 tablet  1  . valsartan-hydrochlorothiazide (DIOVAN-HCT) 320-12.5 MG per tablet TAKE ONE TABLET BY MOUTH ONE TIME DAILY   90 tablet  1   No current facility-administered medications on file prior to visit.    BP 130/80  Pulse 73  Temp(Src) 98.7 F (37.1 C) (Oral)  Resp 20  Ht 5\' 9"  (1.753 m)  Wt 251 lb (113.853 kg)  BMI 37.05 kg/m2  SpO2 97%       Review of Systems  Constitutional: Negative for fever, chills, appetite change and fatigue.  HENT: Negative for congestion, dental problem, ear pain, hearing loss, sore throat, tinnitus, trouble swallowing and voice change.   Eyes: Negative for pain, discharge and visual disturbance.  Respiratory: Negative for cough, chest tightness, wheezing and stridor.   Cardiovascular: Negative for chest pain, palpitations and leg swelling.  Gastrointestinal: Negative for nausea, vomiting, abdominal pain, diarrhea, constipation, blood in stool and abdominal distention.  Genitourinary: Negative for urgency, hematuria, flank pain, discharge, difficulty urinating and genital sores.  Musculoskeletal: Positive for arthralgias and joint swelling. Negative for back pain, gait problem, myalgias and neck stiffness.  Skin: Negative for rash.  Neurological: Negative for dizziness, syncope, speech difficulty, weakness, numbness and headaches.  Hematological: Negative for adenopathy. Does not bruise/bleed easily.    Psychiatric/Behavioral: Negative for behavioral problems and dysphoric mood. The patient is not nervous/anxious.        Objective:   Physical Exam  Constitutional: He appears well-developed and well-nourished.  Moderate distress due to his painful left wrist  Musculoskeletal:  Left wrist hot swollen mildly erythematous and quite painful          Assessment & Plan:   Recurrent acute gouty arthritis left wrist. The patient will be resumed on oral prednisone for 5 days. He had a very prompt and complete response earlier. We'll  continue on allopurinol 100 mg daily for one month and then increase to 300 mg daily. Low purine  Diet information dispensed  HTN- patient's blood pressure is controlled with valsartan hydrochlorothiazide. We'll consider discontinuation of hydrochlorothiazide that may aggravate hyperuricemia and switching to losartan which is uricosuric.

## 2013-07-27 ENCOUNTER — Ambulatory Visit: Payer: Medicare Other | Admitting: Internal Medicine

## 2013-08-03 ENCOUNTER — Encounter (HOSPITAL_BASED_OUTPATIENT_CLINIC_OR_DEPARTMENT_OTHER): Payer: Self-pay | Admitting: Emergency Medicine

## 2013-08-03 ENCOUNTER — Emergency Department (HOSPITAL_BASED_OUTPATIENT_CLINIC_OR_DEPARTMENT_OTHER)
Admission: EM | Admit: 2013-08-03 | Discharge: 2013-08-03 | Disposition: A | Discharge status: Home / Self Care | Payer: Medicare Other | Attending: Emergency Medicine | Admitting: Emergency Medicine

## 2013-08-03 DIAGNOSIS — K219 Gastro-esophageal reflux disease without esophagitis: Secondary | ICD-10-CM | POA: Insufficient documentation

## 2013-08-03 DIAGNOSIS — Z8669 Personal history of other diseases of the nervous system and sense organs: Secondary | ICD-10-CM | POA: Insufficient documentation

## 2013-08-03 DIAGNOSIS — Z860100 Personal history of colon polyps, unspecified: Secondary | ICD-10-CM | POA: Insufficient documentation

## 2013-08-03 DIAGNOSIS — W261XXA Contact with sword or dagger, initial encounter: Secondary | ICD-10-CM

## 2013-08-03 DIAGNOSIS — Z85038 Personal history of other malignant neoplasm of large intestine: Secondary | ICD-10-CM | POA: Insufficient documentation

## 2013-08-03 DIAGNOSIS — Z79899 Other long term (current) drug therapy: Secondary | ICD-10-CM | POA: Insufficient documentation

## 2013-08-03 DIAGNOSIS — I2510 Atherosclerotic heart disease of native coronary artery without angina pectoris: Secondary | ICD-10-CM | POA: Insufficient documentation

## 2013-08-03 DIAGNOSIS — IMO0002 Reserved for concepts with insufficient information to code with codable children: Secondary | ICD-10-CM | POA: Insufficient documentation

## 2013-08-03 DIAGNOSIS — S61209A Unspecified open wound of unspecified finger without damage to nail, initial encounter: Secondary | ICD-10-CM | POA: Insufficient documentation

## 2013-08-03 DIAGNOSIS — Z87891 Personal history of nicotine dependence: Secondary | ICD-10-CM | POA: Insufficient documentation

## 2013-08-03 DIAGNOSIS — M129 Arthropathy, unspecified: Secondary | ICD-10-CM | POA: Insufficient documentation

## 2013-08-03 DIAGNOSIS — Y9389 Activity, other specified: Secondary | ICD-10-CM | POA: Insufficient documentation

## 2013-08-03 DIAGNOSIS — E785 Hyperlipidemia, unspecified: Secondary | ICD-10-CM | POA: Insufficient documentation

## 2013-08-03 DIAGNOSIS — W260XXA Contact with knife, initial encounter: Secondary | ICD-10-CM | POA: Insufficient documentation

## 2013-08-03 DIAGNOSIS — E119 Type 2 diabetes mellitus without complications: Secondary | ICD-10-CM | POA: Insufficient documentation

## 2013-08-03 DIAGNOSIS — M109 Gout, unspecified: Secondary | ICD-10-CM | POA: Insufficient documentation

## 2013-08-03 DIAGNOSIS — I10 Essential (primary) hypertension: Secondary | ICD-10-CM | POA: Insufficient documentation

## 2013-08-03 DIAGNOSIS — Y929 Unspecified place or not applicable: Secondary | ICD-10-CM | POA: Insufficient documentation

## 2013-08-03 DIAGNOSIS — S61002A Unspecified open wound of left thumb without damage to nail, initial encounter: Secondary | ICD-10-CM

## 2013-08-03 DIAGNOSIS — E559 Vitamin D deficiency, unspecified: Secondary | ICD-10-CM | POA: Insufficient documentation

## 2013-08-03 MED ORDER — "THROMBI-PAD 3""X3"" EX PADS"
MEDICATED_PAD | CUTANEOUS | Status: AC
  Administered 2013-08-03: 17:00:00
  Filled 2013-08-03: qty 1

## 2013-08-03 NOTE — ED Provider Notes (Signed)
Medical screening examination/treatment/procedure(s) were performed by non-physician practitioner and as supervising physician I was immediately available for consultation/collaboration.  EKG Interpretation   None         Tanna Furry, MD 08/03/13 [phone]

## 2013-08-03 NOTE — ED Provider Notes (Signed)
CSN: [id number]     Arrival date & time 08/03/13  1617 History   First MD Initiated Contact with Patient 08/03/13 1627     Chief Complaint  Patient presents with  . Laceration   (Consider location/radiation/quality/duration/timing/severity/associated sxs/prior Treatment) HPI Pt is a 73yo male presenting with laceration to tip of left thumb from kitchen knife while he was cutting sausage several hours ago. Pt reports mild pain.  Pt is concerned area will not stop bleeding w/o constant direct pressure. Pt was evaluated at urgent care but was told they did not have any clotting agents to place on wound to help it stop bleeding.  Pt currently has guaze wrapped around thumb. Denies numbness or tingling in thumb. Denies bleeding disorders. Denies being on blood thinners. Pt states he has had tetanus shot within last 5 years.  Past Medical History  Diagnosis Date  . Unspecified vitamin D deficiency   . Calcaneal spur   . Ventral hernia, unspecified, without mention of obstruction or gangrene   . Other and unspecified hyperlipidemia   . External hemorrhoids without mention of complication   . Diverticulosis of colon (without mention of hemorrhage)   . Elevated prostate specific antigen (PSA)   . Gout, unspecified per pt stable  . Unspecified essential hypertension   . Personal history of malignant neoplasm of large intestine   . Personal history of colonic polyps 08/21/2009    TUBULAR ADENOMAS  . Lumbar spondylolysis   . Vitamin B12 deficiency   . Postgastric surgery syndromes   . Coronary artery disease cardiologist- dr Stanford Breed--  visit 01-08-11 in epic  . Normal nuclear stress test 04-23-2010    ef 51%,  septal hypokinesis, normal perfusion  . Echocardiogram findings abnormal, without diagnosis 04-23-2010    normal LV function, mod. left atrial enlargement, mild right artrial enlargement and grade 1 diastolic dysfunction  . Edema of lower extremity     ankles, elevates legs  . OSA  (obstructive sleep apnea) no cpap use due to recent lost 50 lbs  and mask does not fit   . Hiatal hernia   . Hernia, diaphragmatic, without obstruction   . Esophageal reflux controlled w/ prilosec  . Arthritis     generalized  . Acute meniscal tear of knee left  . Frequency of urination     followed by dr dalhstedt  . Diabetes mellitus oral med   Past Surgical History  Procedure Laterality Date  . Vertical banded gastroplasty  1986  . Knee surgery      left  . Shoulder arthroscopy  05-23-2007    RIGHT  . Laminectomy and microdiscectomy lumbar spine  MARCH  2008    L3 -  4  . Knee arthroscopy  2007    RIGHT  . Sigmoid colectomy for cancer  1989  . Cataract extraction w/ intraocular lens  implant, bilateral    . Laparoscopic incisional / umbilical / ventral hernia repair  2006  . Knee arthroscopy  07/13/2011    Procedure: ARTHROSCOPY KNEE;  Surgeon: Cynda Familia;  Location: [facility];  Service: Orthopedics;  Laterality: Left;  partial menisectomy with chondrylplasty   Family History  Problem Relation Age of Onset  . Stroke Father   . Esophagitis Father     died from perforated esophagus  . Breast cancer Mother   . Heart disease Paternal Grandfather   . Colon cancer Maternal Grandmother     questionable   History  Substance Use Topics  . Smoking  status: Former Smoker    Quit date: 07/08/1980  . Smokeless tobacco: Never Used  . Alcohol Use: No     Comment: currently no alcoholic beveraages (on a diet)    Review of Systems  Musculoskeletal: Negative for joint swelling and myalgias.  Skin: Positive for wound.  All other systems reviewed and are negative.    Allergies  Contrast media  Home Medications   Current Outpatient Rx  Name  Route  Sig  Dispense  Refill  . allopurinol (ZYLOPRIM) 300 MG tablet   Oral   Take 1 tablet (300 mg total) by mouth daily.   30 tablet   6   . amLODipine (NORVASC) 5 MG tablet   Oral   Take 1 tablet (5  mg total) by mouth daily.   90 tablet   1   . Cholecalciferol (VITAMIN D) 2000 UNITS CAPS   Oral   Take 1 capsule by mouth daily.         [name] Kitchen HYDROcodone-acetaminophen (NORCO/VICODIN) 5-325 MG per tablet   Oral   Take 1 tablet by mouth every 6 (six) hours as needed for moderate pain.   30 tablet   0   . magnesium 30 MG tablet   Oral   Take 30 mg by mouth daily.         . metFORMIN (GLUCOPHAGE) 500 MG tablet   Oral   Take 1 tablet (500 mg total) by mouth 2 (two) times daily with a meal.   180 tablet   1   . omeprazole (PRILOSEC) 40 MG capsule      Take one capsule every day before meal   90 capsule   1   . Potassium Chloride (KCL-20 PO)   Oral   Take 1 tablet by mouth as needed.         . predniSONE (DELTASONE) 20 MG tablet      1 tablet twice daily for 3 days then one daily   13 tablet   1   . simvastatin (ZOCOR) 20 MG tablet   Oral   Take 1 tablet (20 mg total) by mouth at bedtime.   90 tablet   1   . valsartan-hydrochlorothiazide (DIOVAN-HCT) 320-12.5 MG per tablet      TAKE ONE TABLET BY MOUTH ONE TIME DAILY    90 tablet   1    BP 139/76  Pulse 57  Temp(Src) 98.2 F (36.8 C) (Oral)  Resp 20  Wt 237 lb (107.502 kg)  SpO2 97% Physical Exam  Nursing note and vitals reviewed. Constitutional: He is oriented to person, place, and time. He appears well-developed and well-nourished.  HENT:  Head: Normocephalic and atraumatic.  Eyes: EOM are normal.  Neck: Normal range of motion.  Cardiovascular: Normal rate.   Pulmonary/Chest: Effort normal.  Musculoskeletal: Normal range of motion. He exhibits tenderness. He exhibits no edema.  Neurological: He is alert and oriented to person, place, and time.  Skin: Skin is warm and dry.  Skin avulsion to volar aspect of tip of left thumb. No nail involvement. Bleeding controlled with direct pressure.  Psychiatric: He has a normal mood and affect. His behavior is normal.    ED Course  Procedures   The  wound is cleansed, debrided of foreign material as much as possible, and dressed with thrombi-pad placed over area of bleeding. The patient is alerted to watch for any signs of infection (redness, pus, pain, increased swelling or fever) and call if such occurs. Home wound  care instructions are provided. Tetanus vaccination status reviewed: tetanus status: up to date, reports has received within last 5 years.   Labs Review Labs Reviewed - No data to display Imaging Review No results found.  EKG Interpretation   None       MDM   1. Avulsion of skin of left thumb    Skin avulsion of volar surface, distal aspect of left thumb.  No adipose exposed, no nail involvement. Tetanus up to date. Bleeding controlled with direct pressure but will ooze once bandage removed.  Wound cleaned, thrombi pad and new bandaged placed. Advised to keep bandage on for 24-48 hours to help prevent wound from bleeding again.  Discussed basic wound care. Return precautions provided. Pt verbalized understanding and agreement with tx plan.   Noland Fordyce, PA-C 08/03/13 1659

## 2013-08-03 NOTE — ED Notes (Signed)
Partial avulsion to the tip of his left thumb with a kitchen knife. Unable to keep the bleeding under control without direct pressure.

## 2013-08-03 NOTE — Discharge Instructions (Signed)
Keep bandage in place for 24-48 hours before removing and rechecking wound.  This could cause clot to be removed before initial healing process has completed, thus may lead to wound rebleeding.  Once bleeding has stopped and bandage removed, keep clean with warm soap and water.

## 2013-08-08 ENCOUNTER — Telehealth: Payer: Self-pay | Admitting: Internal Medicine

## 2013-08-08 NOTE — Telephone Encounter (Signed)
Relevant patient education assigned to patient using Emmi. ° °

## 2013-08-10 ENCOUNTER — Telehealth: Payer: Self-pay | Admitting: Internal Medicine

## 2013-08-10 NOTE — Telephone Encounter (Signed)
Pt was seen by dr.k for gout in wrist, pt states the rx predniSONE (DELTASONE) 20 MG tablet worked for about 4 days and once it was gone the pain came back, pt wants to know if he needs to come back in and be seen again.

## 2013-08-13 NOTE — Telephone Encounter (Signed)
Call in prednisone 10 mg .  Once po qd #15.  No RF. Instruction pt to take 10 mg for 10 days then 1/2 tab for 5 days. Return in 2 weeks.  BMET and uric acid level before OV.  Use gout code.

## 2013-08-14 MED ORDER — PREDNISONE 10 MG PO TABS: ORAL_TABLET | ORAL | Status: DC

## 2013-08-14 NOTE — Telephone Encounter (Signed)
rx sent in electronically, left message on cell phone to call back and schedule appt

## 2013-09-07 ENCOUNTER — Encounter: Payer: Self-pay | Admitting: *Deleted

## 2013-09-10 ENCOUNTER — Ambulatory Visit: Payer: Medicare Other | Admitting: Internal Medicine

## 2013-09-24 ENCOUNTER — Encounter: Payer: Self-pay | Admitting: Internal Medicine

## 2013-09-25 ENCOUNTER — Telehealth: Payer: Self-pay | Admitting: Internal Medicine

## 2013-09-25 NOTE — Telephone Encounter (Signed)
Pt has been sch

## 2013-09-25 NOTE — Telephone Encounter (Signed)
Pt is feeling fatigue and decline to see another provider. Pt requesting to see dr Shawna Orleans asap. Can I create slot?

## 2013-09-25 NOTE — Telephone Encounter (Signed)
Just have him come in at 4:15

## 2013-09-26 ENCOUNTER — Encounter: Payer: Self-pay | Admitting: Internal Medicine

## 2013-09-26 ENCOUNTER — Ambulatory Visit (INDEPENDENT_AMBULATORY_CARE_PROVIDER_SITE_OTHER): Payer: Medicare Other | Admitting: Internal Medicine

## 2013-09-26 VITALS — BP 130/70 | Temp 97.5°F | Ht 69.0 in | Wt 262.0 lb

## 2013-09-26 DIAGNOSIS — G4733 Obstructive sleep apnea (adult) (pediatric): Secondary | ICD-10-CM

## 2013-09-26 DIAGNOSIS — R5383 Other fatigue: Secondary | ICD-10-CM

## 2013-09-26 DIAGNOSIS — E785 Hyperlipidemia, unspecified: Secondary | ICD-10-CM

## 2013-09-26 DIAGNOSIS — R51 Headache: Secondary | ICD-10-CM

## 2013-09-26 DIAGNOSIS — I10 Essential (primary) hypertension: Secondary | ICD-10-CM

## 2013-09-26 DIAGNOSIS — F4321 Adjustment disorder with depressed mood: Secondary | ICD-10-CM | POA: Insufficient documentation

## 2013-09-26 DIAGNOSIS — R5381 Other malaise: Secondary | ICD-10-CM

## 2013-09-26 DIAGNOSIS — R7309 Other abnormal glucose: Secondary | ICD-10-CM

## 2013-09-26 DIAGNOSIS — M109 Gout, unspecified: Secondary | ICD-10-CM

## 2013-09-26 DIAGNOSIS — R519 Headache, unspecified: Secondary | ICD-10-CM

## 2013-09-26 NOTE — Assessment & Plan Note (Signed)
His symptoms likely multifactorial. We discussed possibility of symptoms secondary to adjustment disorder with depression. If patient rules out for metabolic or inflammatory process, we discussed starting Wellbutrin XL 150 mg once daily. He declines referral to counselor for now.

## 2013-09-26 NOTE — Assessment & Plan Note (Signed)
Patient advised to follow up with sleep specialist to get refitted for new mask.

## 2013-09-26 NOTE — Progress Notes (Signed)
Pre visit review using our clinic review tool, if applicable. No additional management support is needed unless otherwise documented below in the visit note. 

## 2013-09-26 NOTE — Assessment & Plan Note (Signed)
Patient has been having gout flares in his wrists. Allopurinol increased to 300 mg in January 2015. Monitor LFTs and uric acid level.

## 2013-09-26 NOTE — Patient Instructions (Signed)
Please call your sleep specialist re: restarting CPAP Try over the counter Melatonin 3 mg (dissovling tablet) at bedtime as needed

## 2013-09-26 NOTE — Progress Notes (Signed)
Subjective:    Patient ID: Marcus [name], male    DOB: 1940/09/26, 73 y.o.   MRN: [id number]  HPI  73 year old white male with history of hypertension, gout, abnormal glucose, and obstructive sleep apnea for followup. Patient has multiple complaints. Patient reports severe fatigue and lack of motivation for last 4-6 months. His symptoms progressively worse over the last 2 weeks. Patient reports significant life stressors (financial issues).  Patient also lives with son who has severe refractory depression.  Patient also reports intermittent left temporal pain/discomfort. He denies any symptoms of jaw claudication. His muscles occasionally feel weak.  Obstructive sleep apnea-patient stopped using CPAP due to 50 pound weight loss. However, he has regained weight and is currently unable to tolerate his face mask for CPAP.  Interval medical history-patient was seen by Dr. Burnice Logan for possible gout flare. His allopurinol was increased to 300 mg. No further gout flares.  Hypertension - stable.  Review of Systems  Constitutional: Negative for appetite change and unexpected weight change.  Respiratory: Negative for chest tightness and shortness of breath.   Cardiovascular: Negative for chest pain.  Poor sleep,  No headaches,  No jaw claudication.       Past Medical History  Diagnosis Date  . Unspecified vitamin D deficiency   . Calcaneal spur   . Ventral hernia, unspecified, without mention of obstruction or gangrene   . Other and unspecified hyperlipidemia   . External hemorrhoids without mention of complication   . Diverticulosis of colon (without mention of hemorrhage)   . Elevated prostate specific antigen (PSA)   . Gout, unspecified per pt stable  . Unspecified essential hypertension   . Personal history of malignant neoplasm of large intestine   . Personal history of colonic polyps 08/21/2009    TUBULAR ADENOMAS  . Lumbar spondylolysis   . Vitamin B12 deficiency   .  Postgastric surgery syndromes   . Coronary artery disease cardiologist- dr Stanford Breed--  visit 01-08-11 in epic  . Normal nuclear stress test 04-23-2010    ef 51%,  septal hypokinesis, normal perfusion  . Echocardiogram findings abnormal, without diagnosis 04-23-2010    normal LV function, mod. left atrial enlargement, mild right artrial enlargement and grade 1 diastolic dysfunction  . Edema of lower extremity     ankles, elevates legs  . OSA (obstructive sleep apnea) no cpap use due to recent lost 50 lbs  and mask does not fit   . Hiatal hernia   . Hernia, diaphragmatic, without obstruction   . Esophageal reflux controlled w/ prilosec  . Arthritis     generalized  . Acute meniscal tear of knee left  . Frequency of urination     followed by dr dalhstedt  . Diabetes mellitus oral med    History   Social History  . Marital Status: Married    Spouse Name: N/A    Number of Children: N/A  . Years of Education: N/A   Occupational History  . semi retired    Social History Main Topics  . Smoking status: Former Smoker    Quit date: 07/08/1980  . Smokeless tobacco: Never Used  . Alcohol Use: No     Comment: currently no alcoholic beveraages (on a diet)  . Drug Use: No  . Sexual Activity: Not on file   Other Topics Concern  . Not on file   Social History Narrative     Married   Former Smoker    Alcohol use-yes 1-2 drinks per day  Occupation: Midwife       Past Surgical History  Procedure Laterality Date  . Vertical banded gastroplasty  1986  . Knee surgery      left  . Shoulder arthroscopy  05-23-2007    RIGHT  . Laminectomy and microdiscectomy lumbar spine  MARCH  2008    L3 -  4  . Knee arthroscopy  2007    RIGHT  . Sigmoid colectomy for cancer  1989  . Cataract extraction w/ intraocular lens  implant, bilateral    . Laparoscopic incisional / umbilical / ventral hernia repair  2006  . Knee arthroscopy  07/13/2011    Procedure: ARTHROSCOPY KNEE;  Surgeon:  Cynda Familia;  Location: [facility];  Service: Orthopedics;  Laterality: Left;  partial menisectomy with chondrylplasty    Family History  Problem Relation Age of Onset  . Stroke Father   . Esophagitis Father     died from perforated esophagus  . Breast cancer Mother   . Heart disease Paternal Grandfather   . Colon cancer Maternal Grandmother     questionable    Allergies  Allergen Reactions  . Contrast Media [Iodinated Diagnostic Agents] Palpitations    TACHYCARDIA    Current Outpatient Prescriptions on File Prior to Visit  Medication Sig Dispense Refill  . allopurinol (ZYLOPRIM) 300 MG tablet Take 1 tablet (300 mg total) by mouth daily.  30 tablet  6  . amLODipine (NORVASC) 5 MG tablet Take 1 tablet (5 mg total) by mouth daily.  90 tablet  1  . Cholecalciferol (VITAMIN D) 2000 UNITS CAPS Take 1 capsule by mouth daily.      . magnesium 30 MG tablet Take 30 mg by mouth daily.      . metFORMIN (GLUCOPHAGE) 500 MG tablet Take 1 tablet (500 mg total) by mouth 2 (two) times daily with a meal.  180 tablet  1  . omeprazole (PRILOSEC) 40 MG capsule Take one capsule every day before meal  90 capsule  1  . Potassium Chloride (KCL-20 PO) Take 1 tablet by mouth as needed.      . simvastatin (ZOCOR) 20 MG tablet Take 1 tablet (20 mg total) by mouth at bedtime.  90 tablet  1  . valsartan-hydrochlorothiazide (DIOVAN-HCT) 320-12.5 MG per tablet TAKE ONE TABLET BY MOUTH ONE TIME DAILY   90 tablet  1   No current facility-administered medications on file prior to visit.    BP 130/70  Temp(Src) 97.5 F (36.4 C) (Oral)  Ht 5\' 9"  (1.753 m)  Wt 262 lb (118.842 kg)  BMI 38.67 kg/m2    Objective:   Physical Exam  Constitutional: He is oriented to person, place, and time. He appears well-developed and well-nourished. No distress.  HENT:  Head: Normocephalic and atraumatic.  Minimal left temporal tendnerness  Neck: Neck supple.  Cardiovascular: Normal rate, regular  rhythm and normal heart sounds.   No murmur heard. Pulmonary/Chest: Effort normal and breath sounds normal. He has no wheezes.  Musculoskeletal:  Trace lower extremity edema bilaterally  Lymphadenopathy:    He has no cervical adenopathy.  Neurological: He is alert and oriented to person, place, and time.  Skin: Skin is warm and dry.  Psychiatric: He has a normal mood and affect. His behavior is normal.          Assessment & Plan:

## 2013-09-26 NOTE — Assessment & Plan Note (Signed)
73 year old white male with intermittent left temporal headache associated with fatigue and mild proximal muscle weakness. Rule out polymyalgia rheumatica. Giant cell arteritis is consideration.  Check sed rate.

## 2013-09-26 NOTE — Assessment & Plan Note (Signed)
Patient experiencing mild proximal muscle weakness. Rule out statin induced myopathy/myositis. Check CPK.

## 2013-09-26 NOTE — Assessment & Plan Note (Signed)
Well controlled.  No change in medication. BP: 130/70 mmHg

## 2013-09-27 LAB — HEMOGLOBIN A1C: Hgb A1c MFr Bld: 5.9 % (ref 4.6–6.5)

## 2013-09-27 LAB — CBC WITH DIFFERENTIAL/PLATELET
Basophils Absolute: 0 10*3/uL (ref 0.0–0.1)
Basophils Relative: 0.8 % (ref 0.0–3.0)
Eosinophils Absolute: 0.2 10*3/uL (ref 0.0–0.7)
Eosinophils Relative: 3.9 % (ref 0.0–5.0)
HCT: 42 % (ref 39.0–52.0)
Hemoglobin: 13.9 g/dL (ref 13.0–17.0)
Lymphocytes Relative: 29 % (ref 12.0–46.0)
Lymphs Abs: 1.7 10*3/uL (ref 0.7–4.0)
MCHC: 33.2 g/dL (ref 30.0–36.0)
MCV: 89.4 fl (ref 78.0–100.0)
Monocytes Absolute: 0.6 10*3/uL (ref 0.1–1.0)
Monocytes Relative: 9.8 % (ref 3.0–12.0)
Neutro Abs: 3.2 10*3/uL (ref 1.4–7.7)
Neutrophils Relative %: 56.5 % (ref 43.0–77.0)
Platelets: 215 10*3/uL (ref 150.0–400.0)
RBC: 4.7 Mil/uL (ref 4.22–5.81)
RDW: 16.2 % — ABNORMAL HIGH (ref 11.5–14.6)
WBC: 5.8 10*3/uL (ref 4.5–10.5)

## 2013-09-27 LAB — BASIC METABOLIC PANEL
BUN: 17 mg/dL (ref 6–23)
CO2: 27 mEq/L (ref 19–32)
Calcium: 9.4 mg/dL (ref 8.4–10.5)
Chloride: 104 mEq/L (ref 96–112)
Creatinine, Ser: 1.3 mg/dL (ref 0.4–1.5)
GFR: 59.5 mL/min — ABNORMAL LOW (ref >60.00)
Glucose, Bld: 90 mg/dL (ref 70–99)
Potassium: 4.4 mEq/L (ref 3.5–5.1)
Sodium: 140 mEq/L (ref 135–145)

## 2013-09-27 LAB — CK: Total CK: 120 U/L (ref 7–232)

## 2013-09-27 LAB — TSH: TSH: 0.29 u[IU]/mL — ABNORMAL LOW (ref 0.35–5.50)

## 2013-09-27 LAB — SEDIMENTATION RATE: Sed Rate: 4 mm/hr (ref 0–22)

## 2013-09-27 LAB — URIC ACID: Uric Acid, Serum: 4.3 mg/dL (ref 4.0–7.8)

## 2013-10-01 ENCOUNTER — Other Ambulatory Visit: Payer: Self-pay | Admitting: *Deleted

## 2013-10-01 MED ORDER — BUPROPION HCL ER (XL) 150 MG PO TB24: 150.0000 mg | ORAL_TABLET | Freq: Every day | ORAL | Status: DC

## 2013-10-12 ENCOUNTER — Encounter (HOSPITAL_BASED_OUTPATIENT_CLINIC_OR_DEPARTMENT_OTHER): Payer: Self-pay | Admitting: Emergency Medicine

## 2013-10-12 ENCOUNTER — Emergency Department (HOSPITAL_BASED_OUTPATIENT_CLINIC_OR_DEPARTMENT_OTHER)
Admission: EM | Admit: 2013-10-12 | Discharge: 2013-10-12 | Disposition: A | Discharge status: Home / Self Care | Payer: Medicare Other | Attending: Emergency Medicine | Admitting: Emergency Medicine

## 2013-10-12 DIAGNOSIS — E538 Deficiency of other specified B group vitamins: Secondary | ICD-10-CM | POA: Insufficient documentation

## 2013-10-12 DIAGNOSIS — W268XXA Contact with other sharp object(s), not elsewhere classified, initial encounter: Secondary | ICD-10-CM | POA: Insufficient documentation

## 2013-10-12 DIAGNOSIS — S61219A Laceration without foreign body of unspecified finger without damage to nail, initial encounter: Secondary | ICD-10-CM

## 2013-10-12 DIAGNOSIS — E559 Vitamin D deficiency, unspecified: Secondary | ICD-10-CM | POA: Insufficient documentation

## 2013-10-12 DIAGNOSIS — Z79899 Other long term (current) drug therapy: Secondary | ICD-10-CM | POA: Insufficient documentation

## 2013-10-12 DIAGNOSIS — Y9389 Activity, other specified: Secondary | ICD-10-CM | POA: Insufficient documentation

## 2013-10-12 DIAGNOSIS — S61209A Unspecified open wound of unspecified finger without damage to nail, initial encounter: Secondary | ICD-10-CM | POA: Insufficient documentation

## 2013-10-12 DIAGNOSIS — M109 Gout, unspecified: Secondary | ICD-10-CM | POA: Insufficient documentation

## 2013-10-12 DIAGNOSIS — Z860100 Personal history of colon polyps, unspecified: Secondary | ICD-10-CM | POA: Insufficient documentation

## 2013-10-12 DIAGNOSIS — E785 Hyperlipidemia, unspecified: Secondary | ICD-10-CM | POA: Insufficient documentation

## 2013-10-12 DIAGNOSIS — E119 Type 2 diabetes mellitus without complications: Secondary | ICD-10-CM | POA: Insufficient documentation

## 2013-10-12 DIAGNOSIS — Z87891 Personal history of nicotine dependence: Secondary | ICD-10-CM | POA: Insufficient documentation

## 2013-10-12 DIAGNOSIS — I2510 Atherosclerotic heart disease of native coronary artery without angina pectoris: Secondary | ICD-10-CM | POA: Insufficient documentation

## 2013-10-12 DIAGNOSIS — Z85038 Personal history of other malignant neoplasm of large intestine: Secondary | ICD-10-CM | POA: Insufficient documentation

## 2013-10-12 DIAGNOSIS — I10 Essential (primary) hypertension: Secondary | ICD-10-CM | POA: Insufficient documentation

## 2013-10-12 DIAGNOSIS — K219 Gastro-esophageal reflux disease without esophagitis: Secondary | ICD-10-CM | POA: Insufficient documentation

## 2013-10-12 DIAGNOSIS — Y929 Unspecified place or not applicable: Secondary | ICD-10-CM | POA: Insufficient documentation

## 2013-10-12 DIAGNOSIS — M129 Arthropathy, unspecified: Secondary | ICD-10-CM | POA: Insufficient documentation

## 2013-10-12 NOTE — ED Provider Notes (Signed)
Medical screening examination/treatment/procedure(s) were performed by non-physician practitioner and as supervising physician I was immediately available for consultation/collaboration.   EKG Interpretation None        Charles B. Karle Starch, MD 10/12/13 1239

## 2013-10-12 NOTE — ED Notes (Signed)
Laceration to his left 5th digit this am while cutting tree branches with clippers. Bleeding controlled. Neuro intact.

## 2013-10-12 NOTE — Discharge Instructions (Signed)
Sutured Wound Care °Sutures are stitches that can be used to close wounds. Wound care helps prevent pain and infection.  °HOME CARE INSTRUCTIONS  °· Rest and elevate the injured area until all the pain and swelling are gone. °· Only take over-the-counter or prescription medicines for pain, discomfort, or fever as directed by your caregiver. °· After 48 hours, gently wash the area with mild soap and water once a day, or as directed. Rinse off the soap. Pat the area dry with a clean towel. Do not rub the wound. This may cause bleeding. °· Follow your caregiver's instructions for how often to change the bandage (dressing). Stop using a dressing after 2 days or after the wound stops draining. °· If the dressing sticks, moisten it with soapy water and gently remove it. °· Apply ointment on the wound as directed. °· Avoid stretching a sutured wound. °· Drink enough fluids to keep your urine clear or pale yellow. °· Follow up with your caregiver for suture removal as directed. °· Use sunscreen on your wound for the next 3 to 6 months so the scar will not darken. °SEEK IMMEDIATE MEDICAL CARE IF:  °· Your wound becomes red, swollen, hot, or tender. °· You have increasing pain in the wound. °· You have a red streak that extends from the wound. °· There is pus coming from the wound. °· You have a fever. °· You have shaking chills. °· There is a bad smell coming from the wound. °· You have persistent bleeding from the wound. °MAKE SURE YOU:  °· Understand these instructions. °· Will watch your condition. °· Will get help right away if you are not doing well or get worse. °Document Released: 07/29/2004 Document Revised: 09/13/2011 Document Reviewed: 10/25/2010 °Exit[employer]® Patient Information ©2014 [employer]. ° °

## 2013-10-12 NOTE — ED Provider Notes (Signed)
CSN: [id number]     Arrival date & time 10/12/13  1107 History   First MD Initiated Contact with Patient 10/12/13 1200     Chief Complaint  Patient presents with  . Laceration     (Consider location/radiation/quality/duration/timing/severity/associated sxs/prior Treatment) Patient is a 73 y.o. male presenting with skin laceration. The history is provided by the patient. No language interpreter was used.  Laceration Location:  Finger Finger laceration location:  L little finger Length (cm):  1 Depth:  Through dermis Foreign body present:  No foreign bodies Tetanus status:  Up to date   Past Medical History  Diagnosis Date  . Unspecified vitamin D deficiency   . Calcaneal spur   . Ventral hernia, unspecified, without mention of obstruction or gangrene   . Other and unspecified hyperlipidemia   . External hemorrhoids without mention of complication   . Diverticulosis of colon (without mention of hemorrhage)   . Elevated prostate specific antigen (PSA)   . Gout, unspecified per pt stable  . Unspecified essential hypertension   . Personal history of malignant neoplasm of large intestine   . Personal history of colonic polyps 08/21/2009    TUBULAR ADENOMAS  . Lumbar spondylolysis   . Vitamin B12 deficiency   . Postgastric surgery syndromes   . Coronary artery disease cardiologist- dr Stanford Breed--  visit 01-08-11 in epic  . Normal nuclear stress test 04-23-2010    ef 51%,  septal hypokinesis, normal perfusion  . Echocardiogram findings abnormal, without diagnosis 04-23-2010    normal LV function, mod. left atrial enlargement, mild right artrial enlargement and grade 1 diastolic dysfunction  . Edema of lower extremity     ankles, elevates legs  . OSA (obstructive sleep apnea) no cpap use due to recent lost 50 lbs  and mask does not fit   . Hiatal hernia   . Hernia, diaphragmatic, without obstruction   . Esophageal reflux controlled w/ prilosec  . Arthritis     generalized  .  Acute meniscal tear of knee left  . Frequency of urination     followed by dr dalhstedt  . Diabetes mellitus oral med   Past Surgical History  Procedure Laterality Date  . Vertical banded gastroplasty  1986  . Knee surgery      left  . Shoulder arthroscopy  05-23-2007    RIGHT  . Laminectomy and microdiscectomy lumbar spine  MARCH  2008    L3 -  4  . Knee arthroscopy  2007    RIGHT  . Sigmoid colectomy for cancer  1989  . Cataract extraction w/ intraocular lens  implant, bilateral    . Laparoscopic incisional / umbilical / ventral hernia repair  2006  . Knee arthroscopy  07/13/2011    Procedure: ARTHROSCOPY KNEE;  Surgeon: Cynda Familia;  Location: [facility];  Service: Orthopedics;  Laterality: Left;  partial menisectomy with chondrylplasty   Family History  Problem Relation Age of Onset  . Stroke Father   . Esophagitis Father     died from perforated esophagus  . Breast cancer Mother   . Heart disease Paternal Grandfather   . Colon cancer Maternal Grandmother     questionable   History  Substance Use Topics  . Smoking status: Former Smoker    Quit date: 07/08/1980  . Smokeless tobacco: Never Used  . Alcohol Use: No     Comment: currently no alcoholic beveraages (on a diet)    Review of Systems  Musculoskeletal:  Left finger injury  Skin: Positive for wound.  Neurological: Negative for numbness.      Allergies  Contrast media  Home Medications   Current Outpatient Rx  Name  Route  Sig  Dispense  Refill  . allopurinol (ZYLOPRIM) 300 MG tablet   Oral   Take 1 tablet (300 mg total) by mouth daily.   30 tablet   6   . amLODipine (NORVASC) 5 MG tablet   Oral   Take 1 tablet (5 mg total) by mouth daily.   90 tablet   1   . buPROPion (WELLBUTRIN XL) 150 MG 24 hr tablet   Oral   Take 1 tablet (150 mg total) by mouth daily.   30 tablet   3   . Cholecalciferol (VITAMIN D) 2000 UNITS CAPS   Oral   Take 1 capsule by mouth  daily.         . Cyanocobalamin (B-12) 5000 MCG SUBL   Sublingual   Place 1 tablet under the tongue daily.         [name] Kitchen glucosamine-chondroitin 500-400 MG tablet   Oral   Take 1 tablet by mouth daily.         . magnesium 30 MG tablet   Oral   Take 30 mg by mouth daily.         . metFORMIN (GLUCOPHAGE) 500 MG tablet   Oral   Take 1 tablet (500 mg total) by mouth 2 (two) times daily with a meal.   180 tablet   1   . omeprazole (PRILOSEC) 40 MG capsule      Take one capsule every day before meal   90 capsule   1   . Potassium Chloride (KCL-20 PO)   Oral   Take 1 tablet by mouth as needed.         . simvastatin (ZOCOR) 20 MG tablet   Oral   Take 1 tablet (20 mg total) by mouth at bedtime.   90 tablet   1   . valsartan-hydrochlorothiazide (DIOVAN-HCT) 320-12.5 MG per tablet      TAKE ONE TABLET BY MOUTH ONE TIME DAILY    90 tablet   1    BP 133/79  Pulse 82  Temp(Src) 98.2 F (36.8 C) (Oral)  Resp 18  Wt 262 lb (118.842 kg)  SpO2 97% Physical Exam  Constitutional: He is oriented to person, place, and time.  Neurological: He is oriented to person, place, and time.  Skin:  One cm linear laceration lateral base 5th finger, left. FROM, no tendon deficits.     ED Course  Procedures (including critical care time) Labs Review Labs Reviewed - No data to display Imaging Review No results found.   EKG Interpretation None      MDM   Final diagnoses:  None    1. Left finger laceration  LACERATION REPAIR Performed by: Dewaine Oats Authorized by: Dewaine Oats Consent: Verbal consent obtained. Risks and benefits: risks, benefits and alternatives were discussed Consent given by: patient Patient identity confirmed: provided demographic data Prepped and Draped in normal sterile fashion Wound explored  Laceration Location: left fifth finger, base  Laceration Length: 1cm  No Foreign Bodies seen or palpated  Anesthesia: local  infiltration  Local anesthetic: lidocaine 2% w/o epinephrine  Anesthetic total: 1 ml  Irrigation method: syringe Amount of cleaning: standard  Skin closure: 4-0 ethilon  Number of sutures: 3  Technique: simple interrupted.  Patient tolerance: Patient tolerated the  procedure well with no immediate complications.     Dewaine Oats, PA-C 10/12/13 1237

## 2013-10-26 ENCOUNTER — Telehealth: Payer: Self-pay | Admitting: Internal Medicine

## 2013-10-26 MED ORDER — ALLOPURINOL 300 MG PO TABS: 300.0000 mg | ORAL_TABLET | Freq: Every day | ORAL | Status: DC

## 2013-10-26 NOTE — Telephone Encounter (Signed)
rx sent in electronically 

## 2013-10-26 NOTE — Telephone Encounter (Signed)
[id number] - [place], [place] - [address] is requesting re-fill on allopurinol (ZYLOPRIM) 300 MG tablet

## 2013-10-31 ENCOUNTER — Ambulatory Visit (INDEPENDENT_AMBULATORY_CARE_PROVIDER_SITE_OTHER): Payer: Medicare Other | Admitting: Internal Medicine

## 2013-10-31 ENCOUNTER — Encounter: Payer: Self-pay | Admitting: Internal Medicine

## 2013-10-31 VITALS — BP 124/82 | HR 72 | Temp 98.1°F | Ht 69.0 in | Wt 258.0 lb

## 2013-10-31 DIAGNOSIS — F4321 Adjustment disorder with depressed mood: Secondary | ICD-10-CM

## 2013-10-31 MED ORDER — BUPROPION HCL ER (XL) 150 MG PO TB24: 150.0000 mg | ORAL_TABLET | Freq: Every day | ORAL | Status: DC

## 2013-10-31 NOTE — Progress Notes (Signed)
Subjective:    Patient ID: Marcus [name], male    DOB: [birth year], 73 y.o.   MRN: [id number]  HPI  73 year old white male with history of hypertension, hyperlipidemia and obstructive sleep apnea for followup. Patient previously seen for adjustment disorder with depressive symptoms. His metabolic workup was negative. Patient started on Wellbutrin XL 150 mg once daily. Patient reports he is tolerating medication well without side effects. He reports mild improvement in his mood. He is having less negative thoughts.  He is exercising on regular basis.  Review of Systems Mild weight loss    Past Medical History  Diagnosis Date  . Unspecified vitamin D deficiency   . Calcaneal spur   . Ventral hernia, unspecified, without mention of obstruction or gangrene   . Other and unspecified hyperlipidemia   . External hemorrhoids without mention of complication   . Diverticulosis of colon (without mention of hemorrhage)   . Elevated prostate specific antigen (PSA)   . Gout, unspecified per pt stable  . Unspecified essential hypertension   . Personal history of malignant neoplasm of large intestine   . Personal history of colonic polyps 08/21/2009    TUBULAR ADENOMAS  . Lumbar spondylolysis   . Vitamin B12 deficiency   . Postgastric surgery syndromes   . Coronary artery disease cardiologist- dr Stanford Breed--  visit 01-08-11 in epic  . Normal nuclear stress test 04-23-2010    ef 51%,  septal hypokinesis, normal perfusion  . Echocardiogram findings abnormal, without diagnosis 04-23-2010    normal LV function, mod. left atrial enlargement, mild right artrial enlargement and grade 1 diastolic dysfunction  . Edema of lower extremity     ankles, elevates legs  . OSA (obstructive sleep apnea) no cpap use due to recent lost 50 lbs  and mask does not fit   . Hiatal hernia   . Hernia, diaphragmatic, without obstruction   . Esophageal reflux controlled w/ prilosec  . Arthritis     generalized  . Acute  meniscal tear of knee left  . Frequency of urination     followed by dr dalhstedt  . Diabetes mellitus oral med    History   Social History  . Marital Status: Married    Spouse Name: N/A    Number of Children: N/A  . Years of Education: N/A   Occupational History  . semi retired    Social History Main Topics  . Smoking status: Former Smoker    Quit date: 07/08/1980  . Smokeless tobacco: Never Used  . Alcohol Use: No     Comment: currently no alcoholic beveraages (on a diet)  . Drug Use: No  . Sexual Activity: Not on file   Other Topics Concern  . Not on file   Social History Narrative     Married   Former Smoker    Alcohol use-yes 1-2 drinks per day      Occupation: Midwife       Past Surgical History  Procedure Laterality Date  . Vertical banded gastroplasty  1986  . Knee surgery      left  . Shoulder arthroscopy  05-23-2007    RIGHT  . Laminectomy and microdiscectomy lumbar spine  MARCH  2008    L3 -  4  . Knee arthroscopy  2007    RIGHT  . Sigmoid colectomy for cancer  1989  . Cataract extraction w/ intraocular lens  implant, bilateral    . Laparoscopic incisional / umbilical / ventral hernia repair  2006  . Knee arthroscopy  07/13/2011    Procedure: ARTHROSCOPY KNEE;  Surgeon: Cynda Familia;  Location: [facility];  Service: Orthopedics;  Laterality: Left;  partial menisectomy with chondrylplasty    Family History  Problem Relation Age of Onset  . Stroke Father   . Esophagitis Father     died from perforated esophagus  . Breast cancer Mother   . Heart disease Paternal Grandfather   . Colon cancer Maternal Grandmother     questionable    Allergies  Allergen Reactions  . Contrast Media [Iodinated Diagnostic Agents] Palpitations    TACHYCARDIA    Current Outpatient Prescriptions on File Prior to Visit  Medication Sig Dispense Refill  . allopurinol (ZYLOPRIM) 300 MG tablet Take 1 tablet (300 mg total) by mouth daily.   30 tablet  6  . amLODipine (NORVASC) 5 MG tablet Take 1 tablet (5 mg total) by mouth daily.  90 tablet  1  . Cholecalciferol (VITAMIN D) 2000 UNITS CAPS Take 1 capsule by mouth daily.      . Cyanocobalamin (B-12) 5000 MCG SUBL Place 1 tablet under the tongue daily.      [name] Kitchen glucosamine-chondroitin 500-400 MG tablet Take 1 tablet by mouth daily.      . magnesium 30 MG tablet Take 30 mg by mouth daily.      . metFORMIN (GLUCOPHAGE) 500 MG tablet Take 1 tablet (500 mg total) by mouth 2 (two) times daily with a meal.  180 tablet  1  . omeprazole (PRILOSEC) 40 MG capsule Take one capsule every day before meal  90 capsule  1  . Potassium Chloride (KCL-20 PO) Take 1 tablet by mouth as needed.      . simvastatin (ZOCOR) 20 MG tablet Take 1 tablet (20 mg total) by mouth at bedtime.  90 tablet  1  . valsartan-hydrochlorothiazide (DIOVAN-HCT) 320-12.5 MG per tablet TAKE ONE TABLET BY MOUTH ONE TIME DAILY   90 tablet  1   No current facility-administered medications on file prior to visit.    BP 124/82  Pulse 72  Temp(Src) 98.1 F (36.7 C) (Oral)  Ht 5\' 9"  (1.753 m)  Wt 258 lb (117.028 kg)  BMI 38.08 kg/m2    Objective:   Physical Exam  Constitutional: He is oriented to person, place, and time. He appears well-developed and well-nourished.  Cardiovascular: Normal rate, regular rhythm and normal heart sounds.   No murmur heard. Pulmonary/Chest: Effort normal and breath sounds normal. He has no wheezes.  Neurological: He is alert and oriented to person, place, and time. No cranial nerve deficit.  Psychiatric: He has a normal mood and affect. His behavior is normal.        Assessment & Plan:

## 2013-10-31 NOTE — Assessment & Plan Note (Signed)
Patient's blood work was unremarkable. Patient started on Wellbutrin XL 150 mg once daily. Patient tolerating medication well. He has noticed mild improvement in mood. Continue daily exercise.  Reassess in 2 months.

## 2013-10-31 NOTE — Progress Notes (Signed)
Pre visit review using our clinic review tool, if applicable. No additional management support is needed unless otherwise documented below in the visit note. 

## 2013-11-26 ENCOUNTER — Other Ambulatory Visit: Payer: Self-pay | Admitting: Internal Medicine

## 2013-11-29 ENCOUNTER — Other Ambulatory Visit: Payer: Self-pay | Admitting: Neurosurgery

## 2013-11-29 DIAGNOSIS — M47816 Spondylosis without myelopathy or radiculopathy, lumbar region: Secondary | ICD-10-CM

## 2013-12-11 ENCOUNTER — Ambulatory Visit
Admission: RE | Admit: 2013-12-11 | Discharge: 2013-12-11 | Disposition: A | Discharge status: Home / Self Care | Payer: Medicare Other | Source: Ambulatory Visit | Attending: Neurosurgery | Admitting: Neurosurgery

## 2013-12-11 DIAGNOSIS — M47816 Spondylosis without myelopathy or radiculopathy, lumbar region: Secondary | ICD-10-CM

## 2013-12-11 MED ORDER — GADOBENATE DIMEGLUMINE 529 MG/ML IV SOLN
20.0000 mL | Freq: Once | INTRAVENOUS | Status: AC | PRN
  Administered 2013-12-11: 20 mL via INTRAVENOUS

## 2013-12-17 ENCOUNTER — Other Ambulatory Visit: Payer: Self-pay | Admitting: Internal Medicine

## 2013-12-26 ENCOUNTER — Ambulatory Visit (INDEPENDENT_AMBULATORY_CARE_PROVIDER_SITE_OTHER): Payer: Medicare Other | Admitting: Internal Medicine

## 2013-12-26 ENCOUNTER — Encounter: Payer: Self-pay | Admitting: Internal Medicine

## 2013-12-26 VITALS — BP 132/74 | HR 72 | Temp 98.1°F | Ht 69.0 in | Wt 260.0 lb

## 2013-12-26 DIAGNOSIS — F4321 Adjustment disorder with depressed mood: Secondary | ICD-10-CM

## 2013-12-26 MED ORDER — CITALOPRAM HYDROBROMIDE 20 MG PO TABS: ORAL_TABLET | ORAL | Status: DC

## 2013-12-26 MED ORDER — BUPROPION HCL 75 MG PO TABS: ORAL_TABLET | ORAL | Status: DC

## 2013-12-26 NOTE — Progress Notes (Signed)
Pre visit review using our clinic review tool, if applicable. No additional management support is needed unless otherwise documented below in the visit note. 

## 2013-12-26 NOTE — Progress Notes (Signed)
Subjective:    Patient ID: Marcus [name], male    DOB: October 09, 1940, 74 y.o.   MRN: [id number]  HPI  73 year old white male previously seen for adjustment disorder with depressive symptoms for followup. Patient reports his symptoms not significantly improved Wellbutrin XL 150 mg. Patient having significant difficulty getting over current financial situation.  He feels like he let his family down by taking financial risk.  He has anhedonia.  His wife is very supportive.  Review of Systems No weight change.  Past Medical History  Diagnosis Date  . Unspecified vitamin D deficiency   . Calcaneal spur   . Ventral hernia, unspecified, without mention of obstruction or gangrene   . Other and unspecified hyperlipidemia   . External hemorrhoids without mention of complication   . Diverticulosis of colon (without mention of hemorrhage)   . Elevated prostate specific antigen (PSA)   . Gout, unspecified per pt stable  . Unspecified essential hypertension   . Personal history of malignant neoplasm of large intestine   . Personal history of colonic polyps 08/21/2009    TUBULAR ADENOMAS  . Lumbar spondylolysis   . Vitamin B12 deficiency   . Postgastric surgery syndromes   . Coronary artery disease cardiologist- dr Stanford Breed--  visit 01-08-11 in epic  . Normal nuclear stress test 04-23-2010    ef 51%,  septal hypokinesis, normal perfusion  . Echocardiogram findings abnormal, without diagnosis 04-23-2010    normal LV function, mod. left atrial enlargement, mild right artrial enlargement and grade 1 diastolic dysfunction  . Edema of lower extremity     ankles, elevates legs  . OSA (obstructive sleep apnea) no cpap use due to recent lost 50 lbs  and mask does not fit   . Hiatal hernia   . Hernia, diaphragmatic, without obstruction   . Esophageal reflux controlled w/ prilosec  . Arthritis     generalized  . Acute meniscal tear of knee left  . Frequency of urination     followed by dr  dalhstedt  . Diabetes mellitus oral med    History   Social History  . Marital Status: Married    Spouse Name: N/A    Number of Children: N/A  . Years of Education: N/A   Occupational History  . semi retired    Social History Main Topics  . Smoking status: Former Smoker    Quit date: 07/08/1980  . Smokeless tobacco: Never Used  . Alcohol Use: No     Comment: currently no alcoholic beveraages (on a diet)  . Drug Use: No  . Sexual Activity: Not on file   Other Topics Concern  . Not on file   Social History Narrative     Married   Former Smoker    Alcohol use-yes 1-2 drinks per day      Occupation: Midwife       Past Surgical History  Procedure Laterality Date  . Vertical banded gastroplasty  1986  . Knee surgery      left  . Shoulder arthroscopy  05-23-2007    RIGHT  . Laminectomy and microdiscectomy lumbar spine  MARCH  2008    L3 -  4  . Knee arthroscopy  2007    RIGHT  . Sigmoid colectomy for cancer  1989  . Cataract extraction w/ intraocular lens  implant, bilateral    . Laparoscopic incisional / umbilical / ventral hernia repair  2006  . Knee arthroscopy  07/13/2011    Procedure: ARTHROSCOPY  KNEE;  Surgeon: Cynda Familia;  Location: [facility];  Service: Orthopedics;  Laterality: Left;  partial menisectomy with chondrylplasty    Family History  Problem Relation Age of Onset  . Stroke Father   . Esophagitis Father     died from perforated esophagus  . Breast cancer Mother   . Heart disease Paternal Grandfather   . Colon cancer Maternal Grandmother     questionable    Allergies  Allergen Reactions  . Contrast Media [Iodinated Diagnostic Agents] Palpitations    TACHYCARDIA    Current Outpatient Prescriptions on File Prior to Visit  Medication Sig Dispense Refill  . allopurinol (ZYLOPRIM) 300 MG tablet Take 1 tablet (300 mg total) by mouth daily.  30 tablet  6  . amLODipine (NORVASC) 5 MG tablet Take 1 tablet (5 mg  total) by mouth daily.  90 tablet  1  . Cholecalciferol (VITAMIN D) 2000 UNITS CAPS Take 1 capsule by mouth daily.      . Cyanocobalamin (B-12) 5000 MCG SUBL Place 1 tablet under the tongue daily.      [name] Kitchen glucosamine-chondroitin 500-400 MG tablet Take 1 tablet by mouth daily.      . magnesium 30 MG tablet Take 30 mg by mouth daily.      . metFORMIN (GLUCOPHAGE) 500 MG tablet TAKE ONE TABLET BY MOUTH TWICE DAILY WITH MEALS   180 tablet  1  . omeprazole (PRILOSEC) 40 MG capsule TAKE ONE CAPSULE EVERY DAY BEFORE MEAL   90 capsule  1  . Potassium Chloride (KCL-20 PO) Take 1 tablet by mouth as needed.      . simvastatin (ZOCOR) 20 MG tablet Take 1 tablet (20 mg total) by mouth at bedtime.  90 tablet  1  . valsartan-hydrochlorothiazide (DIOVAN-HCT) 320-12.5 MG per tablet TAKE ONE TABLET BY MOUTH ONE TIME DAILY   90 tablet  0   No current facility-administered medications on file prior to visit.    BP 132/74  Pulse 72  Temp(Src) 98.1 F (36.7 C) (Oral)  Ht 5\' 9"  (1.753 m)  Wt 260 lb (117.935 kg)  BMI 38.38 kg/m2       Objective:   Physical Exam  Constitutional: He appears well-developed and well-nourished. No distress.  Cardiovascular: Normal rate, regular rhythm and normal heart sounds.   Pulmonary/Chest: Effort normal and breath sounds normal.  Psychiatric: His behavior is normal.  Flat affect          Assessment & Plan:

## 2013-12-26 NOTE — Assessment & Plan Note (Signed)
No significant improvement with Wellbutrin XL 150 mg. Taper off Wellbutrin. Transition to citalopram 20 mg once daily. Consult Richardo Priest for counseling.

## 2013-12-27 ENCOUNTER — Other Ambulatory Visit: Payer: Self-pay | Admitting: Internal Medicine

## 2014-02-25 ENCOUNTER — Ambulatory Visit: Payer: Medicare Other | Admitting: Internal Medicine

## 2014-02-25 DIAGNOSIS — Z0289 Encounter for other administrative examinations: Secondary | ICD-10-CM

## 2014-03-22 ENCOUNTER — Telehealth: Payer: Self-pay | Admitting: Internal Medicine

## 2014-03-22 NOTE — Telephone Encounter (Signed)
[id number] - [place], [place] - [address] is requesting re-fill on valsartan-hydrochlorothiazide (DIOVAN-HCT) 320-12.5 MG per tablet

## 2014-03-25 MED ORDER — VALSARTAN-HYDROCHLOROTHIAZIDE 320-12.5 MG PO TABS: ORAL_TABLET | ORAL | Status: DC

## 2014-03-25 NOTE — Telephone Encounter (Signed)
rx sent in electronically 

## 2014-04-28 ENCOUNTER — Other Ambulatory Visit: Payer: Self-pay | Admitting: Internal Medicine

## 2014-05-31 ENCOUNTER — Other Ambulatory Visit: Payer: Self-pay | Admitting: Internal Medicine

## 2014-06-04 ENCOUNTER — Other Ambulatory Visit: Payer: Self-pay | Admitting: Internal Medicine

## 2014-06-19 ENCOUNTER — Ambulatory Visit (INDEPENDENT_AMBULATORY_CARE_PROVIDER_SITE_OTHER): Payer: Medicare Other | Admitting: Internal Medicine

## 2014-06-19 ENCOUNTER — Encounter: Payer: Self-pay | Admitting: Internal Medicine

## 2014-06-19 ENCOUNTER — Ambulatory Visit (INDEPENDENT_AMBULATORY_CARE_PROVIDER_SITE_OTHER): Payer: Medicare Other | Admitting: *Deleted

## 2014-06-19 VITALS — BP 162/90 | HR 71 | Temp 98.0°F | Ht 68.0 in | Wt 259.0 lb

## 2014-06-19 DIAGNOSIS — F4321 Adjustment disorder with depressed mood: Secondary | ICD-10-CM

## 2014-06-19 DIAGNOSIS — R7309 Other abnormal glucose: Secondary | ICD-10-CM

## 2014-06-19 DIAGNOSIS — Z23 Encounter for immunization: Secondary | ICD-10-CM

## 2014-06-19 DIAGNOSIS — R7303 Prediabetes: Secondary | ICD-10-CM

## 2014-06-19 DIAGNOSIS — R972 Elevated prostate specific antigen [PSA]: Secondary | ICD-10-CM

## 2014-06-19 DIAGNOSIS — Z0000 Encounter for general adult medical examination without abnormal findings: Secondary | ICD-10-CM | POA: Insufficient documentation

## 2014-06-19 DIAGNOSIS — I10 Essential (primary) hypertension: Secondary | ICD-10-CM

## 2014-06-19 DIAGNOSIS — E785 Hyperlipidemia, unspecified: Secondary | ICD-10-CM

## 2014-06-19 DIAGNOSIS — R7989 Other specified abnormal findings of blood chemistry: Secondary | ICD-10-CM

## 2014-06-19 HISTORY — DX: Elevated prostate specific antigen (PSA): R97.20

## 2014-06-19 LAB — LIPID PANEL
Cholesterol: 153 mg/dL (ref 0–200)
HDL: 40.7 mg/dL (ref >39.00)
LDL Cholesterol: 75 mg/dL (ref 0–99)
NonHDL: 112.3
Total CHOL/HDL Ratio: 4
Triglycerides: 186 mg/dL — ABNORMAL HIGH (ref 0.0–149.0)
VLDL: 37.2 mg/dL (ref 0.0–40.0)

## 2014-06-19 LAB — BASIC METABOLIC PANEL
BUN: 19 mg/dL (ref 6–23)
CO2: 28 mEq/L (ref 19–32)
Calcium: 9.1 mg/dL (ref 8.4–10.5)
Chloride: 105 mEq/L (ref 96–112)
Creatinine, Ser: 1.2 mg/dL (ref 0.4–1.5)
GFR: 64.68 mL/min (ref >60.00)
Glucose, Bld: 108 mg/dL — ABNORMAL HIGH (ref 70–99)
Potassium: 4 mEq/L (ref 3.5–5.1)
Sodium: 141 mEq/L (ref 135–145)

## 2014-06-19 LAB — HEPATIC FUNCTION PANEL
ALT: 15 U/L (ref 0–53)
AST: 19 U/L (ref 0–37)
Albumin: 3.8 g/dL (ref 3.5–5.2)
Alkaline Phosphatase: 56 U/L (ref 39–117)
Bilirubin, Direct: 0 mg/dL (ref 0.0–0.3)
Total Bilirubin: 0.2 mg/dL (ref 0.2–1.2)
Total Protein: 6.9 g/dL (ref 6.0–8.3)

## 2014-06-19 LAB — HEMOGLOBIN A1C: Hgb A1c MFr Bld: 6 % (ref 4.6–6.5)

## 2014-06-19 LAB — TSH: TSH: 0.64 u[IU]/mL (ref 0.35–4.50)

## 2014-06-19 LAB — T4, FREE: Free T4: 0.89 ng/dL (ref 0.60–1.60)

## 2014-06-19 MED ORDER — SIMVASTATIN 20 MG PO TABS: 20.0000 mg | ORAL_TABLET | Freq: Every day | ORAL | Status: DC

## 2014-06-19 MED ORDER — CITALOPRAM HYDROBROMIDE 20 MG PO TABS: 20.0000 mg | ORAL_TABLET | Freq: Every day | ORAL | Status: DC

## 2014-06-19 MED ORDER — VALSARTAN-HYDROCHLOROTHIAZIDE 320-12.5 MG PO TABS: ORAL_TABLET | ORAL | Status: DC

## 2014-06-19 MED ORDER — ALLOPURINOL 300 MG PO TABS: 300.0000 mg | ORAL_TABLET | Freq: Every day | ORAL | Status: DC

## 2014-06-19 MED ORDER — AMLODIPINE BESYLATE 5 MG PO TABS: 5.0000 mg | ORAL_TABLET | Freq: Every day | ORAL | Status: DC

## 2014-06-19 NOTE — Assessment & Plan Note (Signed)
Continue simvastatin 20 mg once daily. Monitor lipid panel and LFTs.

## 2014-06-19 NOTE — Assessment & Plan Note (Signed)
Patient reports home BP readings are normal. Continue to monitor. No change in medication regimen.

## 2014-06-19 NOTE — Assessment & Plan Note (Signed)
Followed by urology.  (Dr. Diona Fanti).  Patient reports his PSA levels trending upward. Ultrasound guided biopsy of prostate gland planned.

## 2014-06-19 NOTE — Assessment & Plan Note (Signed)
73 year old white male presents for Medicare wellness exam. Medicare wellness questionnaire reviewed in detail. See attached form. Screening for depression, hearing loss, memory loss and fall risk negative. Patient encouraged to follow-up with his ophthalmologist for routine eye exam.  Patient updated with Prevnar 13.

## 2014-06-19 NOTE — Progress Notes (Signed)
Pre visit review using our clinic review tool, if applicable. No additional management support is needed unless otherwise documented below in the visit note. 

## 2014-06-19 NOTE — Patient Instructions (Signed)
Monitor your blood pressure at home

## 2014-06-19 NOTE — Progress Notes (Signed)
Subjective:    Patient ID: Marcus [name], male    DOB: [birth year], 73 y.o.   MRN: [id number]  HPI  73 year old white male with history of hypertension, hyperlipidemia, adjustment disorder with depressed mood presents for Medicare wellness exam. He has not had any hospitalizations or emergency room visits within the last year. Physician Roster reviewed. He has not had any surgeries in the last year. He is overdue for eye exam.  See attached Medicare wellness questionnaire for details.  Screening for substance abuse, fall risk, hearing loss, depression, and memory loss negative.  Adjustment disorder with depressed mood-patient reports good response to citalopram. He is not experiencing any side effects. He has good relationship with his wife.  She has been very supportive. His depressive symptoms improved.  Elevated PSA-patient reports he is to undergo ultrasound-guided biopsy of prostate gland.  Hypertension - stable.  He did not take his BP medications today.  He reports home BP readings are normal.  Review of Systems  Constitutional: Negative for activity change, appetite change and unexpected weight change.  Eyes: Negative for visual disturbance.  Respiratory: Negative for cough, chest tightness and shortness of breath.  Cardiovascular: Negative for chest pain.  Genitourinary: Negative for difficulty urinating.  Neurological: Negative for headaches.  Gastrointestinal: Negative for abdominal pain, heartburn melena or hematochezia Psych: Negative for depression or anxiety Endo:  No polyuria or polydypsia        Past Medical History  Diagnosis Date  . Unspecified vitamin D deficiency   . Calcaneal spur   . Ventral hernia, unspecified, without mention of obstruction or gangrene   . Other and unspecified hyperlipidemia   . External hemorrhoids without mention of complication   . Diverticulosis of colon (without mention of hemorrhage)   . Elevated prostate specific antigen  (PSA)   . Gout, unspecified per pt stable  . Unspecified essential hypertension   . Personal history of malignant neoplasm of large intestine   . Personal history of colonic polyps 08/21/2009    TUBULAR ADENOMAS  . Lumbar spondylolysis   . Vitamin B12 deficiency   . Postgastric surgery syndromes   . Coronary artery disease cardiologist- dr Stanford Breed--  visit 01-08-11 in epic  . Normal nuclear stress test 04-23-2010    ef 51%,  septal hypokinesis, normal perfusion  . Echocardiogram findings abnormal, without diagnosis 04-23-2010    normal LV function, mod. left atrial enlargement, mild right artrial enlargement and grade 1 diastolic dysfunction  . Edema of lower extremity     ankles, elevates legs  . OSA (obstructive sleep apnea) no cpap use due to recent lost 50 lbs  and mask does not fit   . Hiatal hernia   . Hernia, diaphragmatic, without obstruction   . Esophageal reflux controlled w/ prilosec  . Arthritis     generalized  . Acute meniscal tear of knee left  . Frequency of urination     followed by dr dalhstedt  . Diabetes mellitus oral med    History   Social History  . Marital Status: Married    Spouse Name: N/A    Number of Children: N/A  . Years of Education: N/A   Occupational History  . semi retired    Social History Main Topics  . Smoking status: Former Smoker    Quit date: 07/08/1980  . Smokeless tobacco: Never Used  . Alcohol Use: No     Comment: currently no alcoholic beveraages (on a diet)  . Drug Use: No  .  Sexual Activity: Not on file   Other Topics Concern  . Not on file   Social History Narrative   Married   Former Smoker    Alcohol use-yes 1-2 drinks per day      Occupation: Retired Engineer, maintenance roster:   Urologist - Dr. Diona Fanti   Orthopedics - Dr. Nelva Bush    Past Surgical History  Procedure Laterality Date  . Vertical banded gastroplasty  1986  . Knee surgery      left  . Shoulder arthroscopy  05-23-2007    RIGHT    . Laminectomy and microdiscectomy lumbar spine  MARCH  2008    L3 -  4  . Knee arthroscopy  2007    RIGHT  . Sigmoid colectomy for cancer  1989  . Cataract extraction w/ intraocular lens  implant, bilateral    . Laparoscopic incisional / umbilical / ventral hernia repair  2006  . Knee arthroscopy  07/13/2011    Procedure: ARTHROSCOPY KNEE;  Surgeon: Cynda Familia;  Location: [facility];  Service: Orthopedics;  Laterality: Left;  partial menisectomy with chondrylplasty    Family History  Problem Relation Age of Onset  . Stroke Father   . Esophagitis Father     died from perforated esophagus  . Breast cancer Mother   . Heart disease Paternal Grandfather   . Colon cancer Maternal Grandmother     questionable    Allergies  Allergen Reactions  . Contrast Media [Iodinated Diagnostic Agents] Palpitations    TACHYCARDIA    Current Outpatient Prescriptions on File Prior to Visit  Medication Sig Dispense Refill  . Cholecalciferol (VITAMIN D) 2000 UNITS CAPS Take 1 capsule by mouth daily.    . Cyanocobalamin (B-12) 5000 MCG SUBL Place 1 tablet under the tongue daily.    [name] Kitchen glucosamine-chondroitin 500-400 MG tablet Take 1 tablet by mouth daily.    . magnesium 30 MG tablet Take 30 mg by mouth daily.    . metFORMIN (GLUCOPHAGE) 500 MG tablet TAKE ONE TABLET BY MOUTH TWICE A DAY WITH MEALS  180 tablet 0  . omeprazole (PRILOSEC) 40 MG capsule Take one capsule by mouth every day before a meal. 90 capsule 0  . Potassium Chloride (KCL-20 PO) Take 1 tablet by mouth as needed.     No current facility-administered medications on file prior to visit.    BP 162/90 mmHg  Pulse 71  Temp(Src) 98 F (36.7 C) (Oral)  Ht 5\' 8"  (1.727 m)  Wt 259 lb (117.482 kg)  BMI 39.39 kg/m2      Objective:   Physical Exam  Constitutional: He is oriented to person, place, and time. He appears well-developed and well-nourished. No distress.  HENT:  Head: Normocephalic and  atraumatic.  Right Ear: External ear normal.  Left Ear: External ear normal.  Mouth/Throat: Oropharynx is clear and moist.  Hearing is grossly normal, patient able to hear finger rub bilaterally  Eyes: EOM are normal. Pupils are equal, round, and reactive to light. No scleral icterus.  Neck: Neck supple.  No carotid bruit  Cardiovascular: Normal rate, regular rhythm and normal heart sounds.   No murmur heard. Pulmonary/Chest: Effort normal and breath sounds normal. He has no wheezes.  Abdominal: Soft. Bowel sounds are normal. There is no tenderness.  Musculoskeletal: He exhibits no edema.  Lymphadenopathy:    He has no cervical adenopathy.  Neurological: He is alert and oriented to person, place, and  time. No cranial nerve deficit.  Skin: Skin is warm and dry.  Psychiatric: He has a normal mood and affect. His behavior is normal.          Assessment & Plan:

## 2014-07-05 DIAGNOSIS — C61 Malignant neoplasm of prostate: Secondary | ICD-10-CM

## 2014-07-05 HISTORY — DX: Malignant neoplasm of prostate: C61

## 2014-07-25 ENCOUNTER — Encounter: Payer: Self-pay | Admitting: Internal Medicine

## 2014-08-07 ENCOUNTER — Telehealth: Payer: Self-pay | Admitting: Internal Medicine

## 2014-08-07 NOTE — Telephone Encounter (Signed)
I have not seen pt since May of 2014; if symptoms persist, can be seen in ER; otherwise can see PA Kirk Ruths

## 2014-08-07 NOTE — Telephone Encounter (Signed)
Pt states that everything has calmed down now and does not want to come on.  He would like to get Dr Jacalyn Lefevre advice.  Note forwarded to Dr. Stanford Breed for review

## 2014-08-07 NOTE — Telephone Encounter (Signed)
Patient will call cardiology tomorrow to schedule an appt with the PA

## 2014-08-07 NOTE — Telephone Encounter (Signed)
[name] Patient Name: Marcus [name] DOB: 1941-03-04 Initial Comment Caller states he is having chest pain. Nurse Assessment Nurse: Ronnald Ramp, RN, Miranda Date/Time (Eastern Time): 08/07/2014 9:00:57 AM Confirm and document reason for call. If symptomatic, describe symptoms. ---Caller states he is having chest pain off and on since last night. BP 145/81 when he woke up HR 55-67 Has the patient traveled out of the country within the last 30 days? ---Not Applicable Does the patient require triage? ---Yes Related visit to physician within the last 2 weeks? ---No Does the PT have any chronic conditions? (i.e. diabetes, asthma, etc.) ---Yes List chronic conditions. ---HTN, recently diagnosed with prostate cancer - not under treatment at this time. Guidelines Guideline Title Affirmed Question Affirmed Notes Chest Pain [1] Intermittent chest pain from "angina" AND [2] NO increase in severity or frequency Final Disposition User See PCP When Office is Open (within 3 days) Ronnald Ramp, RN, Miranda Comments Told caller to see about getting an appt with cardiologist today but if they cannot see him, call back for appt at PCP office.

## 2014-08-07 NOTE — Telephone Encounter (Signed)
FYI

## 2014-08-12 DIAGNOSIS — C61 Malignant neoplasm of prostate: Secondary | ICD-10-CM | POA: Diagnosis not present

## 2014-08-23 ENCOUNTER — Encounter: Payer: Self-pay | Admitting: Physician Assistant

## 2014-08-23 ENCOUNTER — Ambulatory Visit (INDEPENDENT_AMBULATORY_CARE_PROVIDER_SITE_OTHER): Payer: Medicare Other | Admitting: Physician Assistant

## 2014-08-23 VITALS — BP 128/82 | HR 62 | Ht 68.0 in | Wt 266.0 lb

## 2014-08-23 DIAGNOSIS — E669 Obesity, unspecified: Secondary | ICD-10-CM | POA: Diagnosis not present

## 2014-08-23 DIAGNOSIS — R079 Chest pain, unspecified: Secondary | ICD-10-CM

## 2014-08-23 DIAGNOSIS — R0789 Other chest pain: Secondary | ICD-10-CM

## 2014-08-23 DIAGNOSIS — I10 Essential (primary) hypertension: Secondary | ICD-10-CM

## 2014-08-23 DIAGNOSIS — E785 Hyperlipidemia, unspecified: Secondary | ICD-10-CM

## 2014-08-23 HISTORY — DX: Other chest pain: R07.89

## 2014-08-23 NOTE — Assessment & Plan Note (Signed)
We discussed diet and exercise at length.

## 2014-08-23 NOTE — Assessment & Plan Note (Signed)
bp well controlled

## 2014-08-23 NOTE — Progress Notes (Signed)
Patient ID: Marcus Marcus [name], Marcus [name]   DOB: 1941-03-18, 74 y.o.   MRN: [id number]    Date:  08/23/2014   ID:  Marcus Marcus [name], DOB February 11, 1941, MRN [id number]  PCP:  Marcus Pry, DO  Primary Cardiologist:  Marcus Marcus [name]   Chief Complaint  Patient presents with  . Follow-up    20 months:  Chest pain off and on for year.  Recent episode of left upper chest discomfort lasting 6-7 hours - Almost went to the ER. Has had numerous tests through the years which have all been normal.     History of Present Illness: Marcus Marcus [name] is Marcus 74 y.o. Marcus [name]   Pleasant Marcus [name] for followup of chest pain. Patient apparently with long history of atypical chest pain. Previous cardiac catheterization normal in [place] by his report. Patient saw Dr. Olevia Marcus [name] in October of 2011 with complaints of chest pain and dyspnea. Chest x-ray, d-dimer and BNP unremarkable. Myoview in Oct of 2011 showed an ejection fraction of 51%, septal hypokinesis but normal perfusion. Echocardiogram in October of 2011 showed normal LV function, moderate left atrial enlargement, mild right atrial enlargement and grade 1 diastolic dysfunction. Patient seen in the emergency room in March of 2014 with atypical chest pain. Seen by Dr. Aundra [name]. Enzymes and electrocardiogram negative. Outpatient nuclear study in April of 2014 showed ejection fraction 59%. There was Marcus small scar in the inferior wall but no ischemia.   His been about 20 months since the patient's last visit.  He's done quite well with limited chest pain however, about 2 weeks ago he experienced sharp chest pain in his usual area: left side, just below the shoulder. She was doing nothing at the time. It was about 7 out of 10 in intensity and lasted about 6-7 hours. He took one aspirin and it resolve spontaneously. There was no nausea, vomiting, diaphoresis, shortness of breath, radiation to back, neck or jaw.  He is not had Marcus recurrence since that day. He has not exercised in quite some time but does recall  that when he did he had limited pain and felt much better.   The patient currently denies  orthopnea, dizziness, PND, cough, congestion, abdominal pain, hematochezia, melena, lower extremity edema, claudication.  Wt Readings from Last 3 Encounters:  08/23/14 266 lb (120.657 kg)  06/19/14 259 lb (117.482 kg)  12/26/13 260 lb (117.935 kg)     Past Medical History  Diagnosis Date  . Unspecified vitamin D deficiency   . Calcaneal spur   . Ventral hernia, unspecified, without mention of obstruction or gangrene   . Other and unspecified hyperlipidemia   . External hemorrhoids without mention of complication   . Diverticulosis of colon (without mention of hemorrhage)   . Elevated prostate specific antigen (PSA)   . Gout, unspecified per pt stable  . Unspecified essential hypertension   . Personal history of malignant neoplasm of large intestine   . Personal history of colonic polyps 08/21/2009    TUBULAR ADENOMAS  . Lumbar spondylolysis   . Vitamin B12 deficiency   . Postgastric surgery syndromes   . Coronary artery disease cardiologist- dr Marcus Marcus [name]--  visit 01-08-11 in epic  . Normal nuclear stress test 04-23-2010    ef 51%,  septal hypokinesis, normal perfusion  . Echocardiogram findings abnormal, without diagnosis 04-23-2010    normal LV function, mod. left atrial enlargement, mild right artrial enlargement and grade 1 diastolic dysfunction  . Edema of lower extremity     ankles, elevates  legs  . OSA (obstructive sleep apnea) no cpap use due to recent lost 50 lbs  and mask does not fit   . Hiatal hernia   . Hernia, diaphragmatic, without obstruction   . Esophageal reflux controlled w/ prilosec  . Arthritis     generalized  . Acute meniscal tear of knee left  . Frequency of urination     followed by dr Marcus Marcus [name]  . Diabetes mellitus oral med    Current Outpatient Prescriptions  Medication Sig Dispense Refill  . allopurinol (ZYLOPRIM) 300 MG tablet Take 1 tablet (300 mg total)  by mouth daily. 90 tablet 1  . amLODipine (NORVASC) 5 MG tablet Take 1 tablet (5 mg total) by mouth daily. 90 tablet 1  . aspirin 81 MG tablet Take 81 mg by mouth daily.    . Cholecalciferol (VITAMIN D) 2000 UNITS CAPS Take 1 capsule by mouth daily.    . citalopram (CELEXA) 20 MG tablet Take 1 tablet (20 mg total) by mouth daily. 90 tablet 1  . Cyanocobalamin (B-12) 5000 MCG SUBL Place 1 tablet under the tongue daily.    . magnesium 30 MG tablet Take 30 mg by mouth daily.    . metFORMIN (GLUCOPHAGE) 500 MG tablet TAKE ONE TABLET BY MOUTH TWICE Marcus DAY WITH MEALS  180 tablet 0  . omeprazole (PRILOSEC) 40 MG capsule Take one capsule by mouth every day before Marcus meal. 90 capsule 0  . Potassium Gluconate 595 MG CAPS Take 1 capsule by mouth daily.    . simvastatin (ZOCOR) 20 MG tablet Take 1 tablet (20 mg total) by mouth at bedtime. 90 tablet 1  . valsartan-hydrochlorothiazide (DIOVAN-HCT) 320-12.5 MG per tablet TAKE ONE TABLET BY MOUTH ONE TIME DAILY 90 tablet 1   No current facility-administered medications for this visit.    Allergies:    Allergies  Allergen Reactions  . Contrast Media [Iodinated Diagnostic Agents] Palpitations    TACHYCARDIA    Social History:  The patient  reports that he quit smoking about 34 years ago. He has never used smokeless tobacco. He reports that he does not drink alcohol or use illicit drugs.   Family history:   Family History  Problem Relation Age of Onset  . Stroke Father   . Esophagitis Father     died from perforated esophagus  . Breast cancer Mother   . Heart disease Paternal Grandfather   . Colon cancer Maternal Grandmother     questionable    ROS:  Please see the history of present illness.  All other systems reviewed and negative.   PHYSICAL EXAM: VS:  BP 128/82 mmHg  Pulse 62  Ht 5\' 8"  (1.727 m)  Wt 266 lb (120.657 kg)  BMI 40.45 kg/m2 Obese well developed, in no acute distress HEENT: Pupils are equal round react to light accommodation  extraocular movements are intact.  Neck: no JVDNo cervical lymphadenopathy. Cardiac: Regular rate and rhythm without murmurs rubs or gallops. Lungs:  clear to auscultation bilaterally, no wheezing, rhonchi or rales Abd: soft, nontender, positive bowel sounds all quadrants, no hepatosplenomegaly Ext: Trace lower extremity edema.  2+ radial and dorsalis pedis pulses. Skin: warm and dry Neuro:  Grossly normal  EKG:  Normal sinus rhythm 62 beats Marcus minute  ASSESSMENT AND PLAN:  Problem List Items Addressed This Visit    Obesity    We discussed diet and exercise at length.      Hyperlipidemia    Continue statin  Lipid Panel  Component Value Date/Time   CHOL 153 06/19/2014 1428   TRIG 186.0* 06/19/2014 1428   TRIG 90 01/01/2011 1609   HDL 40.70 06/19/2014 1428   CHOLHDL 4 06/19/2014 1428   VLDL 37.2 06/19/2014 1428   LDLCALC 75 06/19/2014 1428   LDLCALC 65 01/01/2011 1609          Relevant Medications   aspirin 81 MG tablet   Essential hypertension    bp well controlled      Relevant Medications   aspirin 81 MG tablet   Chest pain, atypical    Patient's pain is atypical. I don't think it's cardiac related given his history of noncardiac chest pain and negative workup.  He had one episode 2 weeks ago without recurrence. Given its location, it is probably musculoskeletal.         Other Visit Diagnoses    Chest pain, unspecified chest pain type    -  Primary    Relevant Orders    EKG 12-Lead

## 2014-08-23 NOTE — Assessment & Plan Note (Signed)
Continue statin  Lipid Panel     Component Value Date/Time   CHOL 153 06/19/2014 1428   TRIG 186.0* 06/19/2014 1428   TRIG 90 01/01/2011 1609   HDL 40.70 06/19/2014 1428   CHOLHDL 4 06/19/2014 1428   VLDL 37.2 06/19/2014 1428   LDLCALC 75 06/19/2014 1428   LDLCALC 65 01/01/2011 1609

## 2014-08-23 NOTE — Patient Instructions (Signed)
Your physician recommends that you schedule a follow-up appointment in: One year.  

## 2014-08-23 NOTE — Assessment & Plan Note (Signed)
Patient's pain is atypical. I don't think it's cardiac related given his history of noncardiac chest pain and negative workup.  He had one episode 2 weeks ago without recurrence. Given its location, it is probably musculoskeletal.

## 2014-10-01 DIAGNOSIS — M1711 Unilateral primary osteoarthritis, right knee: Secondary | ICD-10-CM | POA: Diagnosis not present

## 2014-10-07 ENCOUNTER — Ambulatory Visit (INDEPENDENT_AMBULATORY_CARE_PROVIDER_SITE_OTHER): Payer: Medicare Other | Admitting: Internal Medicine

## 2014-10-07 ENCOUNTER — Encounter: Payer: Self-pay | Admitting: Internal Medicine

## 2014-10-07 VITALS — BP 120/80 | HR 66 | Temp 98.1°F | Wt 261.0 lb

## 2014-10-07 DIAGNOSIS — R05 Cough: Secondary | ICD-10-CM | POA: Diagnosis not present

## 2014-10-07 DIAGNOSIS — R059 Cough, unspecified: Secondary | ICD-10-CM

## 2014-10-07 DIAGNOSIS — R053 Chronic cough: Secondary | ICD-10-CM | POA: Insufficient documentation

## 2014-10-07 DIAGNOSIS — R042 Hemoptysis: Secondary | ICD-10-CM

## 2014-10-07 HISTORY — DX: Chronic cough: R05.3

## 2014-10-07 NOTE — Assessment & Plan Note (Signed)
73 year old white male former smoker with 40-pack-year history complains of chronic cough for the last 2-3 months. He has also noticed occasional blood-tinged sputum. Obtain CT of chest to rule out pulmonary malignancy.  Chronic cough may also be secondary to chronic sinusitis. Obtain CT of sinuses.

## 2014-10-07 NOTE — Progress Notes (Signed)
Pre visit review using our clinic review tool, if applicable. No additional management support is needed unless otherwise documented below in the visit note. 

## 2014-10-07 NOTE — Progress Notes (Signed)
Subjective:    Patient ID: Marcus [name], male    DOB: 1941-03-31, 74 y.o.   MRN: [id number]  HPI  74 year old white male with history of colon cancer, hypertension and abnormal glucose complains of chronic intermittent cough for 2-3 months. Cough is productive. Sputum can be white to dark yellow in color.  Patient has also noticed blood-tinged sputum. He denies any nosebleeds. She denies any bleeding of his gums.  Patient diagnosed with colon cancer within the last 6 months.  Former smoker.  40 pack year history   Review of Systems Negative for fever, negative for shortness of breath    Past Medical History  Diagnosis Date  . Unspecified vitamin D deficiency   . Calcaneal spur   . Ventral hernia, unspecified, without mention of obstruction or gangrene   . Other and unspecified hyperlipidemia   . External hemorrhoids without mention of complication   . Diverticulosis of colon (without mention of hemorrhage)   . Elevated prostate specific antigen (PSA)   . Gout, unspecified per pt stable  . Unspecified essential hypertension   . Personal history of malignant neoplasm of large intestine   . Personal history of colonic polyps 08/21/2009    TUBULAR ADENOMAS  . Lumbar spondylolysis   . Vitamin B12 deficiency   . Postgastric surgery syndromes   . Coronary artery disease cardiologist- dr Stanford Breed--  visit 01-08-11 in epic  . Normal nuclear stress test 04-23-2010    ef 51%,  septal hypokinesis, normal perfusion  . Echocardiogram findings abnormal, without diagnosis 04-23-2010    normal LV function, mod. left atrial enlargement, mild right artrial enlargement and grade 1 diastolic dysfunction  . Edema of lower extremity     ankles, elevates legs  . OSA (obstructive sleep apnea) no cpap use due to recent lost 50 lbs  and mask does not fit   . Hiatal hernia   . Hernia, diaphragmatic, without obstruction   . Esophageal reflux controlled w/ prilosec  . Arthritis     generalized  .  Acute meniscal tear of knee left  . Frequency of urination     followed by dr dalhstedt  . Diabetes mellitus oral med    History   Social History  . Marital Status: Married    Spouse Name: N/A  . Number of Children: N/A  . Years of Education: N/A   Occupational History  . semi retired    Social History Main Topics  . Smoking status: Former Smoker    Quit date: 07/08/1980  . Smokeless tobacco: Never Used  . Alcohol Use: No     Comment: currently no alcoholic beveraages (on a diet)  . Drug Use: No  . Sexual Activity: Not on file   Other Topics Concern  . Not on file   Social History Narrative   Married   Former Smoker    Alcohol use-yes 1-2 drinks per day      Occupation: Retired Engineer, maintenance roster:   Urologist - Dr. Diona Fanti   Orthopedics - Dr. Nelva Bush    Past Surgical History  Procedure Laterality Date  . Vertical banded gastroplasty  1986  . Knee surgery      left  . Shoulder arthroscopy  05-23-2007    RIGHT  . Laminectomy and microdiscectomy lumbar spine  MARCH  2008    L3 -  4  . Knee arthroscopy  2007    RIGHT  . Sigmoid colectomy for  cancer  1989  . Cataract extraction w/ intraocular lens  implant, bilateral    . Laparoscopic incisional / umbilical / ventral hernia repair  2006  . Knee arthroscopy  07/13/2011    Procedure: ARTHROSCOPY KNEE;  Surgeon: Cynda Familia;  Location: [facility];  Service: Orthopedics;  Laterality: Left;  partial menisectomy with chondrylplasty    Family History  Problem Relation Age of Onset  . Stroke Father   . Esophagitis Father     died from perforated esophagus  . Breast cancer Mother   . Heart disease Paternal Grandfather   . Colon cancer Maternal Grandmother     questionable    Allergies  Allergen Reactions  . Contrast Media [Iodinated Diagnostic Agents] Palpitations    TACHYCARDIA    Current Outpatient Prescriptions on File Prior to Visit  Medication Sig Dispense  Refill  . allopurinol (ZYLOPRIM) 300 MG tablet Take 1 tablet (300 mg total) by mouth daily. 90 tablet 1  . amLODipine (NORVASC) 5 MG tablet Take 1 tablet (5 mg total) by mouth daily. 90 tablet 1  . aspirin 81 MG tablet Take 81 mg by mouth daily.    . Cholecalciferol (VITAMIN D) 2000 UNITS CAPS Take 1 capsule by mouth daily.    . citalopram (CELEXA) 20 MG tablet Take 1 tablet (20 mg total) by mouth daily. 90 tablet 1  . Cyanocobalamin (B-12) 5000 MCG SUBL Place 1 tablet under the tongue daily.    . magnesium 30 MG tablet Take 30 mg by mouth daily.    . metFORMIN (GLUCOPHAGE) 500 MG tablet TAKE ONE TABLET BY MOUTH TWICE A DAY WITH MEALS  180 tablet 0  . omeprazole (PRILOSEC) 40 MG capsule Take one capsule by mouth every day before a meal. 90 capsule 0  . Potassium Gluconate 595 MG CAPS Take 1 capsule by mouth daily.    . simvastatin (ZOCOR) 20 MG tablet Take 1 tablet (20 mg total) by mouth at bedtime. 90 tablet 1  . valsartan-hydrochlorothiazide (DIOVAN-HCT) 320-12.5 MG per tablet TAKE ONE TABLET BY MOUTH ONE TIME DAILY 90 tablet 1   No current facility-administered medications on file prior to visit.    BP 120/80 mmHg  Pulse 66  Temp(Src) 98.1 F (36.7 C) (Oral)  Wt 261 lb (118.389 kg)  SpO2 96%    Objective:   Physical Exam  Constitutional: He is oriented to person, place, and time. He appears well-developed and well-nourished. No distress.  HENT:  Head: Normocephalic and atraumatic.  Right Ear: External ear normal.  Left Ear: External ear normal.  Mouth/Throat: Oropharynx is clear and moist.  No bleeding of gums, no evidence of anterior nose bleeds  Cardiovascular: Normal rate, regular rhythm and normal heart sounds.   No murmur heard. Pulmonary/Chest: Effort normal and breath sounds normal. He has no wheezes.  Neurological: He is alert and oriented to person, place, and time. No cranial nerve deficit.  Skin: Skin is warm and dry.  Psychiatric: He has a normal mood and  affect. His behavior is normal.          Assessment & Plan:

## 2014-10-11 ENCOUNTER — Ambulatory Visit (INDEPENDENT_AMBULATORY_CARE_PROVIDER_SITE_OTHER)
Admission: RE | Admit: 2014-10-11 | Discharge: 2014-10-11 | Disposition: A | Discharge status: Home / Self Care | Payer: Medicare Other | Source: Ambulatory Visit | Attending: Internal Medicine | Admitting: Internal Medicine

## 2014-10-11 ENCOUNTER — Other Ambulatory Visit: Payer: Self-pay | Admitting: *Deleted

## 2014-10-11 ENCOUNTER — Other Ambulatory Visit: Payer: Self-pay | Admitting: Internal Medicine

## 2014-10-11 DIAGNOSIS — R918 Other nonspecific abnormal finding of lung field: Secondary | ICD-10-CM | POA: Diagnosis not present

## 2014-10-11 DIAGNOSIS — R042 Hemoptysis: Secondary | ICD-10-CM | POA: Diagnosis not present

## 2014-10-11 DIAGNOSIS — R059 Cough, unspecified: Secondary | ICD-10-CM

## 2014-10-11 DIAGNOSIS — R05 Cough: Secondary | ICD-10-CM

## 2014-10-11 MED ORDER — DOXYCYCLINE HYCLATE 100 MG PO TABS: 100.0000 mg | ORAL_TABLET | Freq: Two times a day (BID) | ORAL | Status: DC

## 2014-10-14 ENCOUNTER — Telehealth: Payer: Self-pay | Admitting: Internal Medicine

## 2014-10-14 DIAGNOSIS — R05 Cough: Secondary | ICD-10-CM

## 2014-10-14 DIAGNOSIS — R059 Cough, unspecified: Secondary | ICD-10-CM

## 2014-10-14 DIAGNOSIS — R042 Hemoptysis: Secondary | ICD-10-CM

## 2014-10-14 NOTE — Telephone Encounter (Signed)
Pt is calling requesting more info concerning ct scan. Pt would like dr Shawna Orleans to return his call. Pt has had CA twice

## 2014-10-16 NOTE — Telephone Encounter (Signed)
CT results discussed with pt.  He will need repeat CT of Chest with IV contrast in 3 months re: follow up for right lung inflammatory changes.  Please schedule CT

## 2014-10-17 NOTE — Telephone Encounter (Signed)
Referral order placed.

## 2014-11-07 DIAGNOSIS — M5431 Sciatica, right side: Secondary | ICD-10-CM | POA: Diagnosis not present

## 2014-11-07 DIAGNOSIS — M1712 Unilateral primary osteoarthritis, left knee: Secondary | ICD-10-CM | POA: Diagnosis not present

## 2014-11-08 ENCOUNTER — Ambulatory Visit: Payer: Medicare Other | Admitting: Internal Medicine

## 2014-11-14 DIAGNOSIS — C61 Malignant neoplasm of prostate: Secondary | ICD-10-CM | POA: Diagnosis not present

## 2014-11-20 DIAGNOSIS — C61 Malignant neoplasm of prostate: Secondary | ICD-10-CM | POA: Diagnosis not present

## 2014-11-20 DIAGNOSIS — N400 Enlarged prostate without lower urinary tract symptoms: Secondary | ICD-10-CM | POA: Diagnosis not present

## 2014-12-06 DIAGNOSIS — C61 Malignant neoplasm of prostate: Secondary | ICD-10-CM | POA: Diagnosis not present

## 2014-12-06 DIAGNOSIS — M1711 Unilateral primary osteoarthritis, right knee: Secondary | ICD-10-CM | POA: Diagnosis not present

## 2014-12-06 DIAGNOSIS — M1712 Unilateral primary osteoarthritis, left knee: Secondary | ICD-10-CM | POA: Diagnosis not present

## 2014-12-11 DIAGNOSIS — M1711 Unilateral primary osteoarthritis, right knee: Secondary | ICD-10-CM | POA: Diagnosis not present

## 2014-12-17 ENCOUNTER — Other Ambulatory Visit: Payer: Self-pay | Admitting: Internal Medicine

## 2014-12-18 ENCOUNTER — Ambulatory Visit: Payer: Medicare Other | Admitting: Internal Medicine

## 2014-12-18 DIAGNOSIS — M1711 Unilateral primary osteoarthritis, right knee: Secondary | ICD-10-CM | POA: Diagnosis not present

## 2014-12-25 DIAGNOSIS — M1711 Unilateral primary osteoarthritis, right knee: Secondary | ICD-10-CM | POA: Diagnosis not present

## 2014-12-27 ENCOUNTER — Encounter: Payer: Self-pay | Admitting: Adult Health

## 2014-12-27 ENCOUNTER — Ambulatory Visit (INDEPENDENT_AMBULATORY_CARE_PROVIDER_SITE_OTHER): Payer: Medicare Other | Admitting: Adult Health

## 2014-12-27 ENCOUNTER — Encounter: Payer: Self-pay | Admitting: Internal Medicine

## 2014-12-27 VITALS — BP 130/76 | HR 66 | Temp 98.3°F | Ht 68.0 in | Wt 262.8 lb

## 2014-12-27 DIAGNOSIS — R252 Cramp and spasm: Secondary | ICD-10-CM | POA: Diagnosis not present

## 2014-12-27 DIAGNOSIS — M5136 Other intervertebral disc degeneration, lumbar region: Secondary | ICD-10-CM | POA: Diagnosis not present

## 2014-12-27 DIAGNOSIS — Z09 Encounter for follow-up examination after completed treatment for conditions other than malignant neoplasm: Secondary | ICD-10-CM | POA: Diagnosis not present

## 2014-12-27 DIAGNOSIS — R112 Nausea with vomiting, unspecified: Secondary | ICD-10-CM | POA: Diagnosis not present

## 2014-12-27 DIAGNOSIS — M961 Postlaminectomy syndrome, not elsewhere classified: Secondary | ICD-10-CM | POA: Diagnosis not present

## 2014-12-27 LAB — BASIC METABOLIC PANEL
BUN: 20 mg/dL (ref 6–23)
CO2: 31 mEq/L (ref 19–32)
Calcium: 9.8 mg/dL (ref 8.4–10.5)
Chloride: 105 mEq/L (ref 96–112)
Creatinine, Ser: 1.25 mg/dL (ref 0.40–1.50)
GFR: 59.84 mL/min — ABNORMAL LOW (ref >60.00)
Glucose, Bld: 94 mg/dL (ref 70–99)
Potassium: 4.7 mEq/L (ref 3.5–5.1)
Sodium: 143 mEq/L (ref 135–145)

## 2014-12-27 LAB — MAGNESIUM: Magnesium: 2.3 mg/dL (ref 1.5–2.5)

## 2014-12-27 MED ORDER — PANTOPRAZOLE SODIUM 40 MG PO TBEC: 40.0000 mg | DELAYED_RELEASE_TABLET | Freq: Every day | ORAL | Status: DC

## 2014-12-27 NOTE — Patient Instructions (Signed)
It was nice meeting you today. You can stop the omeprazole and start the Protonix.   Someone from GI will call you to set up an appointment.   I will update you on the blood work. Stay well hydrated in this heat!  If you need anything, please let me know.

## 2014-12-27 NOTE — Progress Notes (Signed)
Pre visit review using our clinic review tool, if applicable. No additional management support is needed unless otherwise documented below in the visit note. 

## 2014-12-27 NOTE — Progress Notes (Signed)
Subjective:    Patient ID: Marcus [name], male    DOB: Jan 21, 1941, 74 y.o.   MRN: [id number]  HPI   Marcus [name] originally came in to discuss his follow up regarding CT scan on 10/11/2014. He had seen Dr. Shawna Orleans at that time for cough that had lasted 2-3 months. He had blood tinged-sputum. The CT showed  Two areas of patchy ground-glass opacity greater than 5 mm in both the right middle and anterior right lower lobes, presumably Inflammatory.  A follow up CT was placed but that patient did not know this. He does endorse that e is feeling better and his cough has subsided. HAs occasional mucus that is yellow white in nature. He denies any fever or URI type symptoms.   He currently has prostate cancer but " is watching it because it is not an aggressive type."   Additional problems that the patient presented were:   Cramps every night in bilateral calf's when he is in bed. He gets an occasional cramp in his upper thighs. This has been going on for 2-3 years. He is taking potassium and magnesium supplements. Endorses not feeling as though he is drinking enough water. Denies any leg swelling.  [name] Kitchen    He also mentions that since he was a child he has had a history of vomiting after he eats. He was diagnosed with GERD sometime in his life and has been taking Omeprazole. He continues to vomit after eating and also feels as though when he is laying down the stomach contents are coming back up into his throat and then going into his lungs. He does feel like he has trouble swallowing food and feels as though" it does not go all the way down." The last time he vomited was two days ago after dinner.    Review of Systems  Constitutional: Negative.   HENT: Positive for trouble swallowing and voice change.   Respiratory: Positive for cough. Negative for apnea, chest tightness, shortness of breath and wheezing.   Cardiovascular: Negative.   Gastrointestinal: Positive for nausea and vomiting. Negative for  diarrhea and constipation.  Genitourinary: Negative.   Musculoskeletal: Negative.   Skin: Negative.   Neurological: Negative.   Psychiatric/Behavioral: Negative.   All other systems reviewed and are negative.      Objective:   Physical Exam  Constitutional: He is oriented to person, place, and time. He appears well-developed and well-nourished. No distress.  obese  Cardiovascular: Normal rate, regular rhythm, normal heart sounds and intact distal pulses.  Exam reveals no gallop and no friction rub.   No murmur heard. Pulmonary/Chest: Effort normal and breath sounds normal. No respiratory distress. He has no wheezes. He has no rales. He exhibits no tenderness.  Abdominal: Soft. Bowel sounds are normal. He exhibits no distension and no mass. There is no tenderness. There is no rebound and no guarding.  obese  Musculoskeletal: Normal range of motion. He exhibits no edema or tenderness.  Neurological: He is alert and oriented to person, place, and time.  Skin: Skin is warm and dry. No rash noted. He is not diaphoretic. No erythema. No pallor.  Psychiatric: He has a normal mood and affect. His behavior is normal. Judgment and thought content normal.  Nursing note and vitals reviewed.      Assessment & Plan:  1. Follow up - CT scheduled for July 15th. Will follow up when results come back  - Follow up as needed  2. Nausea and vomiting, vomiting  of unspecified type - Ambulatory referral to Gastroenterology - Discontinue Omeprazole - pantoprazole (PROTONIX) 40 MG tablet; Take 1 tablet (40 mg total) by mouth daily.  Dispense: 30 tablet; Refill: 3  3. Cramp of both lower extremities - Basic metabolic panel - Magnesium

## 2015-01-01 DIAGNOSIS — M1711 Unilateral primary osteoarthritis, right knee: Secondary | ICD-10-CM | POA: Diagnosis not present

## 2015-01-13 ENCOUNTER — Other Ambulatory Visit: Payer: Medicare Other

## 2015-01-17 ENCOUNTER — Ambulatory Visit (INDEPENDENT_AMBULATORY_CARE_PROVIDER_SITE_OTHER)
Admission: RE | Admit: 2015-01-17 | Discharge: 2015-01-17 | Disposition: A | Discharge status: Home / Self Care | Payer: Medicare Other | Source: Ambulatory Visit | Attending: Internal Medicine | Admitting: Internal Medicine

## 2015-01-17 DIAGNOSIS — R05 Cough: Secondary | ICD-10-CM | POA: Diagnosis not present

## 2015-01-17 DIAGNOSIS — R059 Cough, unspecified: Secondary | ICD-10-CM

## 2015-01-17 DIAGNOSIS — R042 Hemoptysis: Secondary | ICD-10-CM

## 2015-01-21 DIAGNOSIS — M961 Postlaminectomy syndrome, not elsewhere classified: Secondary | ICD-10-CM | POA: Diagnosis not present

## 2015-01-21 DIAGNOSIS — M5136 Other intervertebral disc degeneration, lumbar region: Secondary | ICD-10-CM | POA: Diagnosis not present

## 2015-01-23 ENCOUNTER — Ambulatory Visit: Payer: Medicare Other | Admitting: Internal Medicine

## 2015-01-24 ENCOUNTER — Other Ambulatory Visit: Payer: Self-pay

## 2015-01-24 MED ORDER — SIMVASTATIN 20 MG PO TABS: 20.0000 mg | ORAL_TABLET | Freq: Every day | ORAL | Status: DC

## 2015-01-24 MED ORDER — AMLODIPINE BESYLATE 5 MG PO TABS: 5.0000 mg | ORAL_TABLET | Freq: Every day | ORAL | Status: DC

## 2015-01-24 NOTE — Telephone Encounter (Signed)
Rx sent to pharmacy for 90 day supply; pt is aware.

## 2015-02-05 DIAGNOSIS — M1712 Unilateral primary osteoarthritis, left knee: Secondary | ICD-10-CM | POA: Diagnosis not present

## 2015-02-11 ENCOUNTER — Encounter: Payer: Self-pay | Admitting: *Deleted

## 2015-02-12 DIAGNOSIS — M1712 Unilateral primary osteoarthritis, left knee: Secondary | ICD-10-CM | POA: Diagnosis not present

## 2015-02-14 ENCOUNTER — Ambulatory Visit (INDEPENDENT_AMBULATORY_CARE_PROVIDER_SITE_OTHER): Payer: Medicare Other | Admitting: Gastroenterology

## 2015-02-14 ENCOUNTER — Encounter: Payer: Self-pay | Admitting: Gastroenterology

## 2015-02-14 VITALS — BP 150/78 | Ht 69.0 in | Wt 264.8 lb

## 2015-02-14 DIAGNOSIS — K219 Gastro-esophageal reflux disease without esophagitis: Secondary | ICD-10-CM | POA: Diagnosis not present

## 2015-02-14 DIAGNOSIS — R112 Nausea with vomiting, unspecified: Secondary | ICD-10-CM | POA: Diagnosis not present

## 2015-02-14 MED ORDER — PANTOPRAZOLE SODIUM 40 MG PO TBEC: 40.0000 mg | DELAYED_RELEASE_TABLET | Freq: Every day | ORAL | Status: DC

## 2015-02-14 NOTE — Patient Instructions (Signed)
Food Choices for Gastroesophageal Reflux Disease When you have gastroesophageal reflux disease (GERD), the foods you eat and your eating habits are very important. Choosing the right foods can help ease the discomfort of GERD. WHAT GENERAL GUIDELINES DO I NEED TO FOLLOW?  Choose fruits, vegetables, whole grains, low-fat dairy products, and low-fat meat, fish, and poultry.  Limit fats such as oils, salad dressings, butter, nuts, and avocado.  Keep a food diary to identify foods that cause symptoms.  Avoid foods that cause reflux. These may be different for different people.  Eat frequent small meals instead of three large meals each day.  Eat your meals slowly, in a relaxed setting.  Limit fried foods.  Cook foods using methods other than frying.  Avoid drinking alcohol.  Avoid drinking large amounts of liquids with your meals.  Avoid bending over or lying down until 2-3 hours after eating. WHAT FOODS ARE NOT RECOMMENDED? The following are some foods and drinks that may worsen your symptoms: Vegetables Tomatoes. Tomato juice. Tomato and spaghetti sauce. Chili peppers. Onion and garlic. Horseradish. Fruits Oranges, grapefruit, and lemon (fruit and juice). Meats High-fat meats, fish, and poultry. This includes hot dogs, ribs, ham, sausage, salami, and bacon. Dairy Whole milk and chocolate milk. Sour cream. Cream. Butter. Ice cream. Cream cheese.  Beverages Coffee and tea, with or without caffeine. Carbonated beverages or energy drinks. Condiments Hot sauce. Barbecue sauce.  Sweets/Desserts Chocolate and cocoa. Donuts. Peppermint and spearmint. Fats and Oils High-fat foods, including [place] fries and potato chips. Other Vinegar. Strong spices, such as black pepper, white pepper, red pepper, cayenne, curry powder, cloves, ginger, and chili powder. The items listed above may not be a complete list of foods and beverages to avoid. Contact your dietitian for more  information. Document Released: 06/21/2005 Document Revised: 06/26/2013 Document Reviewed: 04/25/2013 [facility] Patient Information 2015 [place], [place]. This information is not intended to replace advice given to you by your health care provider. Make sure you discuss any questions you have with your health care provider.   Please start taking Protonix 30-60 mins before dinner

## 2015-02-14 NOTE — Progress Notes (Signed)
     02/14/2015 Marcus [name] [id number] May 05, 1941   History of Present Illness:  This is a 74 year old male who was previously known to Dr. Sharlett [name] for treatment of his GERD and for colonoscopy procedures.  Last colonoscopy and EGD was 11/2012.  Colonoscopy revealed multiple flat polyps between 3-5 mm in the rectum that were hyperplastic polyps.  Repeat recommended in 5 years due to remote history of colon cancer in 1989.  EGD revealed anatomy c/w previous gastric stapling and hyperplastic polyp in the stomach.    This appointment was moved up from Dr. Celesta [name] schedule because the patient wanted to be seen sooner.  He presents to our office today with complaints of reflux and intermittent post-prandial vomiting.  He is on protonix 40 mg daily, which he takes in the mornings.  Most of his reflux occurs at night-time after drinking wine and eating heavy/rich dessert late in the evening.  He has avoided those items for the past several days and has not experienced any more issues so far.  Was waking up with reflux and coughing for several hours at night.  Also, a couple of times per month he has episodes of vomiting very shortly after eating a large meal and vomits his undigested food.  Once again has history of gastric stapling.  Has been evaluated extensively from GI standpoint in the past.    Current Medications, Allergies, Past Medical History, Past Surgical History, Family History and Social History were reviewed in [employer] record.   Physical Exam: BP 150/78 mmHg  Ht 5\' 9"  (1.753 m)  Wt 264 lb 12.8 oz (120.112 kg)  BMI 39.09 kg/m2 General: Well developed white male in no acute distress Head: Normocephalic and atraumatic Eyes:  Sclerae anicteric, conjunctiva pink  Ears: Normal auditory acuity Lungs: Clear throughout to auscultation Heart: Regular rate and rhythm Abdomen: Soft, non-distended.  Normal bowel sounds.  Non-tender. Musculoskeletal: Symmetrical  with no gross deformities  Extremities: No edema  Neurological: Alert oriented x 4, grossly non-focal Psychological:  Alert and cooperative. Normal mood and affect  Assessment and Recommendations: -GERD:  Mostly at night-time.  Much improved so far with dietary measures.  Will give GERD dietary instructions.  Could also try to switch protonix to evening before dinner as well.  May have component of gastroparesis as well.  Patient and his wife seemed very satisfied with information provided today and will continue with the dietary measures along with his protonix.  Do not believe that any other evaluation is needed at this time.  Follow-up prn for any other issues.

## 2015-02-17 ENCOUNTER — Other Ambulatory Visit: Payer: Self-pay | Admitting: Urology

## 2015-02-17 DIAGNOSIS — C61 Malignant neoplasm of prostate: Secondary | ICD-10-CM

## 2015-02-19 DIAGNOSIS — M5136 Other intervertebral disc degeneration, lumbar region: Secondary | ICD-10-CM | POA: Diagnosis not present

## 2015-02-19 DIAGNOSIS — M1711 Unilateral primary osteoarthritis, right knee: Secondary | ICD-10-CM | POA: Diagnosis not present

## 2015-02-21 DIAGNOSIS — M961 Postlaminectomy syndrome, not elsewhere classified: Secondary | ICD-10-CM | POA: Diagnosis not present

## 2015-02-21 DIAGNOSIS — M5136 Other intervertebral disc degeneration, lumbar region: Secondary | ICD-10-CM | POA: Diagnosis not present

## 2015-02-24 NOTE — Progress Notes (Signed)
Agree with Ms. Zehr's management.  Mabeline Varas E. Tawan Degroote, MD, FACG  

## 2015-02-27 DIAGNOSIS — M1712 Unilateral primary osteoarthritis, left knee: Secondary | ICD-10-CM | POA: Diagnosis not present

## 2015-03-05 DIAGNOSIS — M1712 Unilateral primary osteoarthritis, left knee: Secondary | ICD-10-CM | POA: Diagnosis not present

## 2015-03-25 ENCOUNTER — Ambulatory Visit: Payer: Medicare Other | Admitting: Internal Medicine

## 2015-03-26 ENCOUNTER — Other Ambulatory Visit: Payer: Self-pay | Admitting: Internal Medicine

## 2015-03-28 ENCOUNTER — Ambulatory Visit (HOSPITAL_COMMUNITY): Payer: Medicare Other

## 2015-03-28 ENCOUNTER — Other Ambulatory Visit: Payer: Self-pay | Admitting: Internal Medicine

## 2015-03-28 DIAGNOSIS — C61 Malignant neoplasm of prostate: Secondary | ICD-10-CM | POA: Diagnosis not present

## 2015-04-04 DIAGNOSIS — C61 Malignant neoplasm of prostate: Secondary | ICD-10-CM | POA: Diagnosis not present

## 2015-04-14 DIAGNOSIS — M1712 Unilateral primary osteoarthritis, left knee: Secondary | ICD-10-CM | POA: Diagnosis not present

## 2015-04-24 ENCOUNTER — Other Ambulatory Visit: Payer: Self-pay | Admitting: Internal Medicine

## 2015-04-27 NOTE — Progress Notes (Signed)
HPI: FU chest pain. Patient apparently with long history of atypical chest pain. Previous cardiac catheterization normal in [place] by his report. Myoview in Oct of 2011 showed an ejection fraction of 51%, septal hypokinesis but normal perfusion. Echocardiogram in October of 2011 showed normal LV function, moderate left atrial enlargement, mild right atrial enlargement and grade 1 diastolic dysfunction. Nuclear study in April of 2014 showed ejection fraction 59%. There was a small scar in the inferior wall but no ischemia. Since last seen, The patient denies dyspnea, chest pain, palpitations or syncope. Occasional mild pedal edema. He is scheduled for knee surgery and we were asked to evaluate preoperatively.  Current Outpatient Prescriptions  Medication Sig Dispense Refill  . allopurinol (ZYLOPRIM) 300 MG tablet TAKE ONE TABLET BY MOUTH ONE TIME DAILY 90 tablet 1  . amLODipine (NORVASC) 5 MG tablet TAKE 1 TABLET (5 MG TOTAL) BY MOUTH DAILY. 90 tablet 1  . Cholecalciferol (VITAMIN D) 2000 UNITS CAPS Take 1 capsule by mouth daily.    . Cyanocobalamin (B-12) 5000 MCG SUBL Place 1 tablet under the tongue daily.    . magnesium 30 MG tablet Take 30 mg by mouth daily.    . metFORMIN (GLUCOPHAGE) 500 MG tablet TAKE ONE TABLET BY MOUTH TWICE DAILY WITH MEALS 180 tablet 0  . pantoprazole (PROTONIX) 40 MG tablet Take 1 tablet (40 mg total) by mouth daily. Take 30-60 min before dinner 30 tablet 3  . Potassium Gluconate 595 MG CAPS Take 1 capsule by mouth daily.    . simvastatin (ZOCOR) 20 MG tablet TAKE 1 TABLET (20 MG TOTAL) BY MOUTH AT BEDTIME. 90 tablet 0   No current facility-administered medications for this visit.     Past Medical History  Diagnosis Date  . Unspecified vitamin D deficiency   . Calcaneal spur   . Ventral hernia   . Hyperlipidemia   . Diverticulosis of colon (without mention of hemorrhage)   . Elevated prostate specific antigen (PSA)   . Gout, unspecified   .  Unspecified essential hypertension   . History of colon cancer   . Personal history of colonic polyps 08/21/2009    TUBULAR ADENOMAS  . Lumbar spondylolysis   . Vitamin B12 deficiency   . Postgastric surgery syndromes   . Coronary artery disease     dr Stanford Breed  . Normal nuclear stress test 04-23-2010    ef 51%,  septal hypokinesis, normal perfusion  . Echocardiogram findings abnormal, without diagnosis 04-23-2010    normal LV function, mod. left atrial enlargement, mild right artrial enlargement and grade 1 diastolic dysfunction  . Edema of lower extremity     ankles, elevates legs  . OSA (obstructive sleep apnea)   . Hiatal hernia   . External hemorrhoids   . Esophageal reflux   . Arthritis     generalized  . Acute meniscal tear of knee left  . Frequency of urination     followed by dr dalhstedt  . Diabetes mellitus   . Paraesophageal hernia     large  . Prostate cancer ([place]) 2016    Past Surgical History  Procedure Laterality Date  . Vertical banded gastroplasty  1986  . Knee surgery      left  . Shoulder arthroscopy  05-23-2007    RIGHT  . Laminectomy and microdiscectomy lumbar spine  MARCH  2008    L3 -  4  . Knee arthroscopy  2007    RIGHT  . Sigmoid  colectomy for cancer  1989  . Cataract extraction w/ intraocular lens  implant, bilateral    . Laparoscopic incisional / umbilical / ventral hernia repair  2006  . Knee arthroscopy  07/13/2011    Procedure: ARTHROSCOPY KNEE;  Surgeon: Cynda Familia;  Location: [facility];  Service: Orthopedics;  Laterality: Left;  partial menisectomy with chondrylplasty  . Colonoscopy    . Esophagogastroduodenoscopy      Social History   Social History  . Marital Status: Married    Spouse Name: N/A  . Number of Children: N/A  . Years of Education: N/A   Occupational History  . semi retired    Social History Main Topics  . Smoking status: Former Smoker    Quit date: 07/08/1980  . Smokeless tobacco:  Never Used  . Alcohol Use: 0.0 oz/week    0 Standard drinks or equivalent per week  . Drug Use: No  . Sexual Activity: Not on file   Other Topics Concern  . Not on file   Social History Narrative   Married   Former Smoker    Alcohol use-yes 1-2 drinks per day      Occupation: Retired Engineer, maintenance roster:   Urologist - Dr. Diona Fanti   Orthopedics - Dr. Nelva Bush    ROS: Knee arthralgias but no fevers or chills, productive cough, hemoptysis, dysphasia, odynophagia, melena, hematochezia, dysuria, hematuria, rash, seizure activity, orthopnea, PND, pedal edema, claudication. Remaining systems are negative.  Physical Exam: Well-developed obese in no acute distress.  Skin is warm and dry.  HEENT is normal.  Neck is supple.  Chest is clear to auscultation with normal expansion.  Cardiovascular exam is regular rate and rhythm.  Abdominal exam nontender or distended. No masses palpated. Extremities show trace edema. neuro grossly intact  ECG Sinus rhythm at a rate of 76. First-degree AV block. No ST changes.

## 2015-04-29 ENCOUNTER — Encounter: Payer: Self-pay | Admitting: Cardiology

## 2015-04-29 ENCOUNTER — Ambulatory Visit (INDEPENDENT_AMBULATORY_CARE_PROVIDER_SITE_OTHER): Payer: Medicare Other | Admitting: Cardiology

## 2015-04-29 ENCOUNTER — Telehealth: Payer: Self-pay | Admitting: *Deleted

## 2015-04-29 VITALS — BP 134/82 | HR 78 | Ht 68.5 in | Wt 264.2 lb

## 2015-04-29 DIAGNOSIS — I10 Essential (primary) hypertension: Secondary | ICD-10-CM

## 2015-04-29 DIAGNOSIS — E785 Hyperlipidemia, unspecified: Secondary | ICD-10-CM | POA: Diagnosis not present

## 2015-04-29 DIAGNOSIS — Z01810 Encounter for preprocedural cardiovascular examination: Secondary | ICD-10-CM

## 2015-04-29 NOTE — Assessment & Plan Note (Signed)
Patient is scheduled for knee surgery.He's had no chest pain or dyspnea recently. Last nuclear study normal. I do not think he requires further cardiac evaluation prior to his surgery. Would continue present medications pre-and postoperatively.

## 2015-04-29 NOTE — Telephone Encounter (Signed)
Office note containing clearance for left knee TKA faxed

## 2015-04-29 NOTE — Patient Instructions (Signed)
Your physician wants you to follow-up in: ONE YEAR WITH DR CRENSHAW You will receive a reminder letter in the mail two months in advance. If you don't receive a letter, please call our office to schedule the follow-up appointment.   If you need a refill on your cardiac medications before your next appointment, please call your pharmacy.  

## 2015-04-29 NOTE — Assessment & Plan Note (Signed)
Continue statin. 

## 2015-04-29 NOTE — Assessment & Plan Note (Signed)
Blood pressure controlled. Continue present medications. 

## 2015-05-05 ENCOUNTER — Other Ambulatory Visit: Payer: Self-pay | Admitting: Orthopedic Surgery

## 2015-05-06 ENCOUNTER — Ambulatory Visit (INDEPENDENT_AMBULATORY_CARE_PROVIDER_SITE_OTHER): Payer: Medicare Other | Admitting: Adult Health

## 2015-05-06 ENCOUNTER — Encounter: Payer: Self-pay | Admitting: Adult Health

## 2015-05-06 ENCOUNTER — Ambulatory Visit (INDEPENDENT_AMBULATORY_CARE_PROVIDER_SITE_OTHER)
Admission: RE | Admit: 2015-05-06 | Discharge: 2015-05-06 | Disposition: A | Discharge status: Home / Self Care | Payer: Medicare Other | Source: Ambulatory Visit | Attending: Adult Health | Admitting: Adult Health

## 2015-05-06 VITALS — BP 130/80 | Temp 98.3°F | Ht 68.5 in | Wt 265.8 lb

## 2015-05-06 DIAGNOSIS — S6992XA Unspecified injury of left wrist, hand and finger(s), initial encounter: Secondary | ICD-10-CM

## 2015-05-06 DIAGNOSIS — Z01818 Encounter for other preprocedural examination: Secondary | ICD-10-CM | POA: Diagnosis not present

## 2015-05-06 DIAGNOSIS — R200 Anesthesia of skin: Secondary | ICD-10-CM | POA: Diagnosis not present

## 2015-05-06 MED ORDER — CEPHALEXIN 500 MG PO CAPS
500.0000 mg | ORAL_CAPSULE | Freq: Three times a day (TID) | ORAL | Status: DC
Start: 2015-05-06 — End: 2015-05-25

## 2015-05-06 MED ORDER — TRAMADOL HCL 50 MG PO TABS: 50.0000 mg | ORAL_TABLET | Freq: Three times a day (TID) | ORAL | Status: DC | PRN

## 2015-05-06 NOTE — Progress Notes (Signed)
Subjective:    Patient ID: Marcus [name], male    DOB: [birth year], 74 y.o.   MRN: [id number]  HPI  74 year old male who presents to the office today for clearnace for his upcoming left knee surgery. He has been cleared by Dr. Stanford Breed (Cardiology), EKG there was normal.   He also has an acute issue of puncture wound to the left hand by a staple gun less than 24 hours ago. He was working with a staple gun when he accidentally stapled his hand. The staple was removed by the patient. The staple was two prong and penetrated the soft tissue of the pad below the third metacarpal. He endorses that today when he work up his hand was painful, swollen,and he was unable to have normal range of motion in his 1st,2nd and 3rd digit. Pain is worse with movement but he does have pain with his hand being stationary.   He is UTD on his Tdap.   Denies fevers or streaking up his hand  Has tried tylenol/motrin without relief. Has not used ice.   Review of Systems  Constitutional: Negative.   Respiratory: Negative.   Cardiovascular: Negative.   Gastrointestinal: Negative.   Musculoskeletal: Positive for myalgias and joint swelling.  Skin: Positive for color change and wound.  Neurological: Negative.   All other systems reviewed and are negative.  Past Medical History  Diagnosis Date  . Unspecified vitamin D deficiency   . Calcaneal spur   . Ventral hernia   . Hyperlipidemia   . Diverticulosis of colon (without mention of hemorrhage)   . Elevated prostate specific antigen (PSA)   . Gout, unspecified   . Unspecified essential hypertension   . History of colon cancer   . Personal history of colonic polyps 08/21/2009    TUBULAR ADENOMAS  . Lumbar spondylolysis   . Vitamin B12 deficiency   . Postgastric surgery syndromes   . Coronary artery disease     dr Stanford Breed  . Normal nuclear stress test 04-23-2010    ef 51%,  septal hypokinesis, normal perfusion  . Echocardiogram findings abnormal,  without diagnosis 04-23-2010    normal LV function, mod. left atrial enlargement, mild right artrial enlargement and grade 1 diastolic dysfunction  . Edema of lower extremity     ankles, elevates legs  . OSA (obstructive sleep apnea)   . Hiatal hernia   . External hemorrhoids   . Esophageal reflux   . Arthritis     generalized  . Acute meniscal tear of knee left  . Frequency of urination     followed by dr dalhstedt  . Diabetes mellitus   . Paraesophageal hernia     large  . Prostate cancer [facility]) 2016    Social History   Social History  . Marital Status: Married    Spouse Name: N/A  . Number of Children: N/A  . Years of Education: N/A   Occupational History  . semi retired    Social History Main Topics  . Smoking status: Former Smoker    Quit date: 07/08/1980  . Smokeless tobacco: Never Used  . Alcohol Use: 0.0 oz/week    0 Standard drinks or equivalent per week  . Drug Use: No  . Sexual Activity: Not on file   Other Topics Concern  . Not on file   Social History Narrative   Married   Former Smoker    Alcohol use-yes 1-2 drinks per day      Occupation: Retired  food broker         Physician roster:   Urologist - Dr. Diona Fanti   Orthopedics - Dr. Nelva Bush    Past Surgical History  Procedure Laterality Date  . Vertical banded gastroplasty  1986  . Knee surgery      left  . Shoulder arthroscopy  05-23-2007    RIGHT  . Laminectomy and microdiscectomy lumbar spine  MARCH  2008    L3 -  4  . Knee arthroscopy  2007    RIGHT  . Sigmoid colectomy for cancer  1989  . Cataract extraction w/ intraocular lens  implant, bilateral    . Laparoscopic incisional / umbilical / ventral hernia repair  2006  . Knee arthroscopy  07/13/2011    Procedure: ARTHROSCOPY KNEE;  Surgeon: Cynda Familia;  Location: [place];  Service: Orthopedics;  Laterality: Left;  partial menisectomy with chondrylplasty  . Colonoscopy    . Esophagogastroduodenoscopy       Family History  Problem Relation Age of Onset  . Stroke Father   . Esophagitis Father     died from perforated esophagus  . Breast cancer Mother   . Heart disease Paternal Grandfather   . Colon cancer Maternal Grandmother     questionable    Allergies  Allergen Reactions  . Contrast Media [Iodinated Diagnostic Agents] Palpitations    TACHYCARDIA- patient states he has tolerated newer agents since this reaction >30 yrs ago    Current Outpatient Prescriptions on File Prior to Visit  Medication Sig Dispense Refill  . acetaminophen (TYLENOL) 500 MG tablet Take 1,000 mg by mouth every 6 (six) hours as needed for mild pain.    [name] Kitchen allopurinol (ZYLOPRIM) 300 MG tablet TAKE ONE TABLET BY MOUTH ONE TIME DAILY 90 tablet 1  . amLODipine (NORVASC) 5 MG tablet TAKE 1 TABLET (5 MG TOTAL) BY MOUTH DAILY. 90 tablet 1  . Cholecalciferol (VITAMIN D) 2000 UNITS CAPS Take 1 capsule by mouth daily.    . magnesium oxide (MAG-OX) 400 MG tablet Take 400 mg by mouth daily.    . metFORMIN (GLUCOPHAGE) 500 MG tablet TAKE ONE TABLET BY MOUTH TWICE DAILY WITH MEALS 180 tablet 0  . Multiple Vitamins-Minerals (MULTIVITAMIN WITH MINERALS) tablet Take 1 tablet by mouth daily.    [name] Kitchen oxymetazoline (AFRIN) 0.05 % nasal spray Place 1 spray into both nostrils at bedtime.    . pantoprazole (PROTONIX) 40 MG tablet Take 1 tablet (40 mg total) by mouth daily. Take 30-60 min before dinner 30 tablet 3  . Potassium Gluconate 595 MG CAPS Take 1 capsule by mouth daily.    . simvastatin (ZOCOR) 20 MG tablet TAKE 1 TABLET (20 MG TOTAL) BY MOUTH AT BEDTIME. 90 tablet 0  . vitamin B-12 (CYANOCOBALAMIN) 1000 MCG tablet Take 1,000 mcg by mouth daily.     No current facility-administered medications on file prior to visit.    BP 130/80 mmHg  Temp(Src) 98.3 F (36.8 C) (Oral)  Ht 5' 8.5" (1.74 m)  Wt 265 lb 12.8 oz (120.566 kg)  BMI 39.82 kg/m2       Objective:   Physical Exam  Constitutional: He is oriented to person,  place, and time. He appears well-developed and well-nourished. No distress.  Cardiovascular: Normal rate, regular rhythm, normal heart sounds and intact distal pulses.  Exam reveals no gallop and no friction rub.   No murmur heard. Pulmonary/Chest: Effort normal and breath sounds normal. No respiratory distress. He has no wheezes. He has  no rales. He exhibits no tenderness.  Musculoskeletal: Normal range of motion. He exhibits edema and tenderness.  He is able to move his fingers on his left hand. Cannot go half way with the 'Ok' sign without having to stop due to pain.  Does have pain with palpation to fingers 1-3 on left hand.   Neurological: He is alert and oriented to person, place, and time. He has normal reflexes.  Skin: Skin is warm and dry. No rash noted. He is not diaphoretic. No erythema. No pallor.  Two small puncture wounds below left middle finger metacarpal.  Swelling and redness noted Pain with palpation  Psychiatric: He has a normal mood and affect. His behavior is normal. Judgment and thought content normal.  Nursing note and vitals reviewed.     Assessment & Plan:  1. Hand injury, left, initial encounter - Concern for infection especially since it is a penetrating injury,  and possible ligament/tenden involvement.  - DG Hand Complete Left; Future - traMADol (ULTRAM) 50 MG tablet; Take 1 tablet (50 mg total) by mouth every 8 (eight) hours as needed.  Dispense: 30 tablet; Refill: 0 - Ambulatory referral to Hand Surgery - cephALEXin (KEFLEX) 500 MG capsule; Take 1 capsule (500 mg total) by mouth 3 (three) times daily.  Dispense: 30 capsule; Refill: 0 - He is to go to the ER with any additional swelling or pain - Apply ice 2. Pre-op evaluation - Will be cleared for surgery.  - Paper work faxed to [employer].

## 2015-05-06 NOTE — Patient Instructions (Addendum)
It was great seeing you again.   Take the Keflex (antibiotics) three times a day for 10 days.   Use the Tramadol as needed for pain  I will follow up with ou regarding your x ray   Someone will contact you to schedule an appointment with the hand specialist.

## 2015-05-06 NOTE — Progress Notes (Signed)
Pre visit review using our clinic review tool, if applicable. No additional management support is needed unless otherwise documented below in the visit note. 

## 2015-05-07 DIAGNOSIS — S61243A Puncture wound with foreign body of left middle finger without damage to nail, initial encounter: Secondary | ICD-10-CM | POA: Diagnosis not present

## 2015-05-12 DIAGNOSIS — S61243A Puncture wound with foreign body of left middle finger without damage to nail, initial encounter: Secondary | ICD-10-CM | POA: Diagnosis not present

## 2015-05-13 ENCOUNTER — Ambulatory Visit (HOSPITAL_COMMUNITY)
Admission: RE | Admit: 2015-05-13 | Discharge: 2015-05-13 | Disposition: A | Discharge status: Home / Self Care | Payer: Medicare Other | Source: Ambulatory Visit | Attending: Anesthesiology | Admitting: Anesthesiology

## 2015-05-13 ENCOUNTER — Encounter (HOSPITAL_COMMUNITY): Payer: Self-pay

## 2015-05-13 ENCOUNTER — Encounter (HOSPITAL_COMMUNITY)
Admission: RE | Admit: 2015-05-13 | Discharge: 2015-05-13 | Disposition: A | Discharge status: Home / Self Care | Payer: Medicare Other | Source: Ambulatory Visit | Attending: Specialist | Admitting: Specialist

## 2015-05-13 ENCOUNTER — Encounter (INDEPENDENT_AMBULATORY_CARE_PROVIDER_SITE_OTHER): Payer: Self-pay

## 2015-05-13 DIAGNOSIS — Z01818 Encounter for other preprocedural examination: Secondary | ICD-10-CM | POA: Diagnosis not present

## 2015-05-13 DIAGNOSIS — Z01811 Encounter for preprocedural respiratory examination: Secondary | ICD-10-CM | POA: Diagnosis not present

## 2015-05-13 DIAGNOSIS — Z01812 Encounter for preprocedural laboratory examination: Secondary | ICD-10-CM | POA: Insufficient documentation

## 2015-05-13 DIAGNOSIS — R938 Abnormal findings on diagnostic imaging of other specified body structures: Secondary | ICD-10-CM | POA: Insufficient documentation

## 2015-05-13 DIAGNOSIS — M1710 Unilateral primary osteoarthritis, unspecified knee: Secondary | ICD-10-CM | POA: Diagnosis not present

## 2015-05-13 DIAGNOSIS — R9389 Abnormal findings on diagnostic imaging of other specified body structures: Secondary | ICD-10-CM

## 2015-05-13 HISTORY — DX: Pneumonia, unspecified organism: J18.9

## 2015-05-13 LAB — BASIC METABOLIC PANEL
Anion gap: 8 (ref 5–15)
BUN: 16 mg/dL (ref 6–20)
CO2: 25 mmol/L (ref 22–32)
Calcium: 9.2 mg/dL (ref 8.9–10.3)
Chloride: 107 mmol/L (ref 101–111)
Creatinine, Ser: 1.1 mg/dL (ref 0.61–1.24)
GFR calc Af Amer: >60 mL/min (ref >60)
GFR calc non Af Amer: >60 mL/min (ref >60)
Glucose, Bld: 98 mg/dL (ref 65–99)
Potassium: 4 mmol/L (ref 3.5–5.1)
Sodium: 140 mmol/L (ref 135–145)

## 2015-05-13 LAB — URINALYSIS, ROUTINE W REFLEX MICROSCOPIC
Bilirubin Urine: NEGATIVE
Glucose, UA: NEGATIVE mg/dL
Hgb urine dipstick: NEGATIVE
Ketones, ur: NEGATIVE mg/dL
Leukocytes, UA: NEGATIVE
Nitrite: NEGATIVE
Protein, ur: NEGATIVE mg/dL
Specific Gravity, Urine: 1.019 (ref 1.005–1.030)
Urobilinogen, UA: 0.2 mg/dL (ref 0.0–1.0)
pH: 6 (ref 5.0–8.0)

## 2015-05-13 LAB — CBC
HCT: 44.4 % (ref 39.0–52.0)
Hemoglobin: 14.2 g/dL (ref 13.0–17.0)
MCH: 28.2 pg (ref 26.0–34.0)
MCHC: 32 g/dL (ref 30.0–36.0)
MCV: 88.3 fL (ref 78.0–100.0)
Platelets: 223 10*3/uL (ref 150–400)
RBC: 5.03 MIL/uL (ref 4.22–5.81)
RDW: 14.6 % (ref 11.5–15.5)
WBC: 5.3 10*3/uL (ref 4.0–10.5)

## 2015-05-13 LAB — TYPE AND SCREEN
ABO/RH(D): A POS
Antibody Screen: NEGATIVE

## 2015-05-13 LAB — PROTIME-INR
INR: 1.06 (ref 0.00–1.49)
Prothrombin Time: 14 seconds (ref 11.6–15.2)

## 2015-05-13 LAB — ABO/RH: ABO/RH(D): A POS

## 2015-05-13 LAB — SURGICAL PCR SCREEN
MRSA, PCR: NEGATIVE
Staphylococcus aureus: NEGATIVE

## 2015-05-13 LAB — APTT: aPTT: 28 seconds (ref 24–37)

## 2015-05-13 NOTE — Progress Notes (Addendum)
Clearance per epic and on chart per Beaulah Dinning NP 05/06/2015 Clearance per chart per Dr Stanford Breed / cardiology 04/29/2015 H&P included EKG epic 05/02/2015 Stress test epic 10/03/2012 ECHO epic 04/23/2010

## 2015-05-13 NOTE — Patient Instructions (Signed)
Marcus [name]  05/13/2015   Your procedure is scheduled on: Friday May 23, 2015   Report to [facility] Main  Entrance take [place]  elevators to 3rd floor to  [place] at 11:45 AM.  Call this number if you have problems the morning of surgery [phone]   Remember: ONLY 1 PERSON MAY GO WITH YOU TO SHORT STAY TO GET  READY MORNING OF [place].  Do not eat food or drink liquids :After Midnight but may take clear liquids till 7:45 am day of surgery then nothing by mouth.      Take these medicines the morning of surgery with A SIP OF WATER: Allopurinol; Amlodipine (Norvasc);   DO NOT TAKE ANY DIABETIC MEDICATIONS DAY OF YOUR SURGERY                               You may not have any metal on your body including hair pins and              piercings  Do not wear jewelry,lotions, powders or colognes, deodorant                          Men may shave face and neck.   Do not bring valuables to the hospital. [place].  Contacts, dentures or bridgework may not be worn into surgery.  Leave suitcase in the car. After surgery it may be brought to your room.                Please read over the following fact sheets you were given:MRSA INFORMATION SHEET; INCENTIVE SPIROMETER; BLOOD TRANSFUSION INFORMATION SHEET _____________________________________________________________________             [facility] - Preparing for Surgery Before surgery, you can play an important role.  Because skin is not sterile, your skin needs to be as free of germs as possible.  You can reduce the number of germs on your skin by washing with CHG (chlorahexidine gluconate) soap before surgery.  CHG is an antiseptic cleaner which kills germs and bonds with the skin to continue killing germs even after washing. Please DO NOT use if you have an allergy to CHG or antibacterial soaps.  If your skin becomes reddened/irritated stop using  the CHG and inform your nurse when you arrive at Short Stay. Do not shave (including legs and underarms) for at least 48 hours prior to the first CHG shower.  You may shave your face/neck. Please follow these instructions carefully:  1.  Shower with CHG Soap the night before surgery and the  morning of Surgery.  2.  If you choose to wash your hair, wash your hair first as usual with your  normal  shampoo.  3.  After you shampoo, rinse your hair and body thoroughly to remove the  shampoo.                           4.  Use CHG as you would any other liquid soap.  You can apply chg directly  to the skin and wash  Gently with a scrungie or clean washcloth.  5.  Apply the CHG Soap to your body ONLY FROM THE NECK DOWN.   Do not use on face/ open                           Wound or open sores. Avoid contact with eyes, ears mouth and genitals (private parts).                       Wash face,  Genitals (private parts) with your normal soap.             6.  Wash thoroughly, paying special attention to the area where your surgery  will be performed.  7.  Thoroughly rinse your body with warm water from the neck down.  8.  DO NOT shower/wash with your normal soap after using and rinsing off  the CHG Soap.                9.  Pat yourself dry with a clean towel.            10.  Wear clean pajamas.            11.  Place clean sheets on your bed the night of your first shower and do not  sleep with pets. Day of Surgery : Do not apply any lotions/deodorants the morning of surgery.  Please wear clean clothes to the hospital/surgery center.  FAILURE TO FOLLOW THESE INSTRUCTIONS MAY RESULT IN THE CANCELLATION OF YOUR SURGERY PATIENT SIGNATURE_________________________________  NURSE SIGNATURE__________________________________  ________________________________________________________________________    CLEAR LIQUID DIET   Foods Allowed                                                                      Foods Excluded  Coffee and tea, regular and decaf                             liquids that you cannot  Plain Jell-O in any flavor                                             see through such as: Fruit ices (not with fruit pulp)                                     milk, soups, orange juice  Iced Popsicles                                    All solid food Carbonated beverages, regular and diet                                    Cranberry, grape and apple juices Sports drinks like Gatorade Lightly seasoned clear broth or consume(fat free) Sugar, honey syrup  Sample Menu Breakfast                                Lunch                                     Supper Cranberry juice                    Beef broth                            Chicken broth Jell-O                                     Grape juice                           Apple juice Coffee or tea                        Jell-O                                      Popsicle                                                Coffee or tea                        Coffee or tea  _____________________________________________________________________    Incentive Spirometer  An incentive spirometer is a tool that can help keep your lungs clear and active. This tool measures how well you are filling your lungs with each breath. Taking long deep breaths may help reverse or decrease the chance of developing breathing (pulmonary) problems (especially infection) following:  A long period of time when you are unable to move or be active. BEFORE THE PROCEDURE   If the spirometer includes an indicator to show your best effort, your nurse or respiratory therapist will set it to a desired goal.  If possible, sit up straight or lean slightly forward. Try not to slouch.  Hold the incentive spirometer in an upright position. INSTRUCTIONS FOR USE   Sit on the edge of your bed if possible, or sit up as far as you can in bed or on a  chair.  Hold the incentive spirometer in an upright position.  Breathe out normally.  Place the mouthpiece in your mouth and seal your lips tightly around it.  Breathe in slowly and as deeply as possible, raising the piston or the ball toward the top of the column.  Hold your breath for 3-5 seconds or for as long as possible. Allow the piston or ball to fall to the bottom of the column.  Remove the mouthpiece from your mouth and breathe out normally.  Rest for a few seconds and repeat Steps 1 through 7 at least 10 times every 1-2 hours when you are awake. Take your time and take a few normal breaths between  deep breaths.  The spirometer may include an indicator to show your best effort. Use the indicator as a goal to work toward during each repetition.  After each set of 10 deep breaths, practice coughing to be sure your lungs are clear. If you have an incision (the cut made at the time of surgery), support your incision when coughing by placing a pillow or rolled up towels firmly against it. Once you are able to get out of bed, walk around indoors and cough well. You may stop using the incentive spirometer when instructed by your caregiver.  RISKS AND COMPLICATIONS  Take your time so you do not get dizzy or light-headed.  If you are in pain, you may need to take or ask for pain medication before doing incentive spirometry. It is harder to take a deep breath if you are having pain. AFTER USE  Rest and breathe slowly and easily.  It can be helpful to keep track of a log of your progress. Your caregiver can provide you with a simple table to help with this. If you are using the spirometer at home, follow these instructions: [place] IF:   You are having difficultly using the spirometer.  You have trouble using the spirometer as often as instructed.  Your pain medication is not giving enough relief while using the spirometer.  You develop fever of 100.5 F (38.1 C) or  higher. SEEK IMMEDIATE MEDICAL CARE IF:   You cough up bloody sputum that had not been present before.  You develop fever of 102 F (38.9 C) or greater.  You develop worsening pain at or near the incision site. MAKE SURE YOU:   Understand these instructions.  Will watch your condition.  Will get help right away if you are not doing well or get worse. Document Released: 11/01/2006 Document Revised: 09/13/2011 Document Reviewed: 01/02/2007 Exit[employer] Patient Information 2014 Exit[employer], [place].   ________________________________________________________________________  WHAT IS A BLOOD TRANSFUSION? Blood Transfusion Information  A transfusion is the replacement of blood or some of its parts. Blood is made up of multiple cells which provide different functions.  Red blood cells carry oxygen and are used for blood loss replacement.  White blood cells fight against infection.  Platelets control bleeding.  Plasma helps clot blood.  Other blood products are available for specialized needs, such as hemophilia or other clotting disorders. BEFORE THE TRANSFUSION  Who gives blood for transfusions?   Healthy volunteers who are fully evaluated to make sure their blood is safe. This is blood bank blood. Transfusion therapy is the safest it has ever been in the practice of medicine. Before blood is taken from a donor, a complete history is taken to make sure that person has no history of diseases nor engages in risky social behavior (examples are intravenous drug use or sexual activity with multiple partners). The donor's travel history is screened to minimize risk of transmitting infections, such as malaria. The donated blood is tested for signs of infectious diseases, such as HIV and hepatitis. The blood is then tested to be sure it is compatible with you in order to minimize the chance of a transfusion reaction. If you or a relative donates blood, this is often done in anticipation of surgery  and is not appropriate for emergency situations. It takes many days to process the donated blood. RISKS AND COMPLICATIONS Although transfusion therapy is very safe and saves many lives, the main dangers of transfusion include:   Getting an infectious disease.  Developing a transfusion reaction. This is an allergic reaction to something in the blood you were given. Every precaution is taken to prevent this. The decision to have a blood transfusion has been considered carefully by your caregiver before blood is given. Blood is not given unless the benefits outweigh the risks. AFTER THE TRANSFUSION  Right after receiving a blood transfusion, you will usually feel much better and more energetic. This is especially true if your red blood cells have gotten low (anemic). The transfusion raises the level of the red blood cells which carry oxygen, and this usually causes an energy increase.  The nurse administering the transfusion will monitor you carefully for complications. HOME CARE INSTRUCTIONS  No special instructions are needed after a transfusion. You may find your energy is better. Speak with your caregiver about any limitations on activity for underlying diseases you may have. SEEK MEDICAL CARE IF:   Your condition is not improving after your transfusion.  You develop redness or irritation at the intravenous (IV) site. SEEK IMMEDIATE MEDICAL CARE IF:  Any of the following symptoms occur over the next 12 hours:  Shaking chills.  You have a temperature by mouth above 102 F (38.9 C), not controlled by medicine.  Chest, back, or muscle pain.  People around you feel you are not acting correctly or are confused.  Shortness of breath or difficulty breathing.  Dizziness and fainting.  You get a rash or develop hives.  You have a decrease in urine output.  Your urine turns a dark color or changes to pink, red, or brown. Any of the following symptoms occur over the next 10  days:  You have a temperature by mouth above 102 F (38.9 C), not controlled by medicine.  Shortness of breath.  Weakness after normal activity.  The white part of the eye turns yellow (jaundice).  You have a decrease in the amount of urine or are urinating less often.  Your urine turns a dark color or changes to pink, red, or brown. Document Released: 06/18/2000 Document Revised: 09/13/2011 Document Reviewed: 02/05/2008 [facility] Patient Information 2014 [place], [place].  _______________________________________________________________________

## 2015-05-14 NOTE — H&P (Signed)
TOTAL KNEE ADMISSION H&P  Patient is being admitted for left total knee arthroplasty.  Subjective:  Chief Complaint:left knee pain.  HPI: Marcus [name], 74 y.o. male, has a history of pain and functional disability in the left knee due to arthritis and has failed non-surgical conservative treatments for greater than 12 weeks to includeNSAID's and/or analgesics, corticosteriod injections, viscosupplementation injections, flexibility and strengthening excercises, use of assistive devices, weight reduction as appropriate and activity modification.  Onset of symptoms was gradual, starting 3 years ago with gradually worsening course since that time. The patient noted prior procedures on the knee to include  arthroscopy on the right knee(s).  Patient currently rates pain in the left knee(s) at 7 out of 10 with activity. Patient has worsening of pain with activity and weight bearing, pain that interferes with activities of daily living, pain with passive range of motion and joint swelling.  Patient has evidence of subchondral sclerosis, periarticular osteophytes and joint space narrowing by imaging studies. This patient has had osteoarthritis. There is no active infection.  Patient Active Problem List   Diagnosis Date Noted  . Preop cardiovascular exam 04/29/2015  . Nausea with vomiting 02/14/2015  . Chronic cough 10/07/2014  . Chest pain, atypical 08/23/2014  . Elevated PSA 06/19/2014  . Preventative health care 06/19/2014  . Temporal headache 09/26/2013  . Adjustment disorder with depressed mood 09/26/2013  . Osteoarthritis of left knee 03/12/2013  . Regurgitation 02/08/2013  . Lumbar strain 12/14/2011  . Skin lesion of face 07/23/2011  . Lump in chest 03/31/2011  . Personal history of malignant neoplasm of rectum, rectosigmoid junction, and anus 01/19/2011  . Iron deficiency anemia, unspecified  01/19/2011  . Other general symptoms  01/19/2011  . Hemorrhoids 01/19/2011  . Bradycardia  01/08/2011  . ALOPECIA 05/27/2010  . UNSPECIFIED VITAMIN D DEFICIENCY 10/28/2009  . CALCANEAL SPUR 03/31/2009  . VENTRAL HERNIA 02/04/2009  . Hyperlipidemia 07/26/2008  . EXTERNAL HEMORRHOIDS 04/30/2008  . Obesity 10/17/2007  . INSOMNIA UNSPECIFIED 08/21/2007  . Abdominal pain, unspecified site 06/05/2007  . PROSTATE SPECIFIC ANTIGEN, ELEVATED 06/05/2007  . GOUT NOS 01/18/2007  . SLEEP APNEA, OBSTRUCTIVE 01/18/2007  . Essential hypertension 01/18/2007  . GERD 01/18/2007  . COLON CANCER, HX OF 01/18/2007  . COLONIC POLYPS, HX OF 01/18/2007  . HIATAL HERNIA 10/26/2006  . DIVERTICULOSIS, COLON 07/07/2005   Past Medical History  Diagnosis Date  . Unspecified vitamin D deficiency   . Calcaneal spur   . Ventral hernia   . Hyperlipidemia   . Diverticulosis of colon (without mention of hemorrhage)   . Elevated prostate specific antigen (PSA)   . Gout, unspecified   . Unspecified essential hypertension   . History of colon cancer   . Personal history of colonic polyps 08/21/2009    TUBULAR ADENOMAS  . Lumbar spondylolysis   . Vitamin B12 deficiency   . Postgastric surgery syndromes   . Coronary artery disease     dr Stanford Breed  . Normal nuclear stress test 04-23-2010    ef 51%,  septal hypokinesis, normal perfusion  . Echocardiogram findings abnormal, without diagnosis 04-23-2010    normal LV function, mod. left atrial enlargement, mild right artrial enlargement and grade 1 diastolic dysfunction  . Edema of lower extremity     ankles, elevates legs  . Hiatal hernia   . External hemorrhoids   . Esophageal reflux   . Arthritis     generalized  . Acute meniscal tear of knee left  . Frequency of urination  followed by dr dalhstedt  . Diabetes mellitus   . Paraesophageal hernia     large  . Prostate cancer ([place]) 2016  . OSA (obstructive sleep apnea)     does not use CPAP   . Pneumonia     history of; last episode 2015    Past Surgical History  Procedure Laterality Date   . Vertical banded gastroplasty  1986  . Knee surgery      left  . Shoulder arthroscopy  05-23-2007    RIGHT  . Laminectomy and microdiscectomy lumbar spine  MARCH  2008    L3 -  4  . Knee arthroscopy  2007    RIGHT  . Sigmoid colectomy for cancer  1989  . Cataract extraction w/ intraocular lens  implant, bilateral    . Laparoscopic incisional / umbilical / ventral hernia repair  2006  . Knee arthroscopy  07/13/2011    Procedure: ARTHROSCOPY KNEE;  Surgeon: Cynda Familia;  Location: [facility];  Service: Orthopedics;  Laterality: Left;  partial menisectomy with chondrylplasty  . Colonoscopy    . Esophagogastroduodenoscopy    . Cardiac catheterization      No prescriptions prior to admission   Allergies  Allergen Reactions  . Contrast Media [Iodinated Diagnostic Agents] Palpitations    TACHYCARDIA- patient states he has tolerated newer agents since this reaction >30 yrs ago    Social History  Substance Use Topics  . Smoking status: Former Smoker -- 2.00 packs/day for 20 years    Types: Cigarettes    Quit date: 07/05/1974  . Smokeless tobacco: Never Used  . Alcohol Use: 0.0 oz/week    0 Standard drinks or equivalent per week     Comment: socially; also 1-2 glasses of wine or beer every other day     Family History  Problem Relation Age of Onset  . Stroke Father   . Esophagitis Father     died from perforated esophagus  . Breast cancer Mother   . Heart disease Paternal Grandfather   . Colon cancer Maternal Grandmother     questionable     Review of Systems  Constitutional: Negative.   HENT: Negative.   Eyes: Negative.   Respiratory: Negative.   Cardiovascular: Negative.   Gastrointestinal: Negative.   Genitourinary: Negative.   Musculoskeletal: Positive for back pain and joint pain.  Skin: Negative.   Neurological: Negative.   Endo/Heme/Allergies: Negative.   Psychiatric/Behavioral: Negative.     Objective:  Physical Exam   Constitutional: He is oriented to person, place, and time. He appears well-developed.  HENT:  Head: Normocephalic.  Eyes: EOM are normal.  Neck: Normal range of motion.  Cardiovascular: Normal rate, normal heart sounds and intact distal pulses.   Respiratory: Effort normal.  GI: Soft.  Genitourinary:  Deferred  Musculoskeletal:  Left knee pain with rOM. Knee is stable. LLE grossly n/v intact.  Neurological: He is alert and oriented to person, place, and time. He has normal reflexes.  Skin: Skin is warm and dry.  Psychiatric: His behavior is normal.    Vital signs in last 24 hours:    Labs:   Estimated body mass index is 39.58 kg/(m^2) as calculated from the following:   Height as of 04/29/15: 5' 8.5" (1.74 m).   Weight as of 04/29/15: 119.84 kg (264 lb 3.2 oz).   Imaging Review Plain radiographs demonstrate moderate degenerative joint disease of the left knee(s). The overall alignment ismild varus. The bone quality appears to be  good for age and reported activity level.  Assessment/Plan:  End stage arthritis, left knee   The patient history, physical examination, clinical judgment of the provider and imaging studies are consistent with end stage degenerative joint disease of the left knee(s) and total knee arthroplasty is deemed medically necessary. The treatment options including medical management, injection therapy arthroscopy and arthroplasty were discussed at length. The risks and benefits of total knee arthroplasty were presented and reviewed. The risks due to aseptic loosening, infection, stiffness, patella tracking problems, thromboembolic complications and other imponderables were discussed. The patient acknowledged the explanation, agreed to proceed with the plan and consent was signed. Patient is being admitted for inpatient treatment for surgery, pain control, PT, OT, prophylactic antibiotics, VTE prophylaxis, progressive ambulation and ADL's and discharge planning.  The patient is planning to be discharged home with home health services.  Contraindications and adverse affects of Tranexamic acid discussed in detail. Patient is not a candidate.

## 2015-05-23 ENCOUNTER — Encounter (HOSPITAL_COMMUNITY): Payer: Self-pay | Admitting: *Deleted

## 2015-05-23 ENCOUNTER — Inpatient Hospital Stay (HOSPITAL_COMMUNITY): Payer: Medicare Other | Admitting: Anesthesiology

## 2015-05-23 ENCOUNTER — Inpatient Hospital Stay (HOSPITAL_COMMUNITY)
Admission: RE | Admit: 2015-05-23 | Discharge: 2015-05-25 | DRG: 470 | Disposition: A | Discharge status: Home Health | Payer: Medicare Other | Source: Ambulatory Visit | Attending: Specialist | Admitting: Specialist

## 2015-05-23 ENCOUNTER — Encounter (HOSPITAL_COMMUNITY)
Admission: AD | Disposition: A | Discharge status: Home Health | Payer: Self-pay | Source: Ambulatory Visit | Attending: Specialist

## 2015-05-23 DIAGNOSIS — R269 Unspecified abnormalities of gait and mobility: Secondary | ICD-10-CM | POA: Diagnosis not present

## 2015-05-23 DIAGNOSIS — I10 Essential (primary) hypertension: Secondary | ICD-10-CM | POA: Diagnosis present

## 2015-05-23 DIAGNOSIS — M179 Osteoarthritis of knee, unspecified: Secondary | ICD-10-CM | POA: Diagnosis not present

## 2015-05-23 DIAGNOSIS — E139 Other specified diabetes mellitus without complications: Secondary | ICD-10-CM | POA: Diagnosis not present

## 2015-05-23 DIAGNOSIS — E119 Type 2 diabetes mellitus without complications: Secondary | ICD-10-CM | POA: Diagnosis present

## 2015-05-23 DIAGNOSIS — K449 Diaphragmatic hernia without obstruction or gangrene: Secondary | ICD-10-CM | POA: Diagnosis present

## 2015-05-23 DIAGNOSIS — I2510 Atherosclerotic heart disease of native coronary artery without angina pectoris: Secondary | ICD-10-CM | POA: Diagnosis not present

## 2015-05-23 DIAGNOSIS — G4733 Obstructive sleep apnea (adult) (pediatric): Secondary | ICD-10-CM | POA: Diagnosis not present

## 2015-05-23 DIAGNOSIS — Z6839 Body mass index (BMI) 39.0-39.9, adult: Secondary | ICD-10-CM

## 2015-05-23 DIAGNOSIS — Z96659 Presence of unspecified artificial knee joint: Secondary | ICD-10-CM

## 2015-05-23 DIAGNOSIS — M25562 Pain in left knee: Secondary | ICD-10-CM | POA: Diagnosis present

## 2015-05-23 DIAGNOSIS — M1712 Unilateral primary osteoarthritis, left knee: Principal | ICD-10-CM | POA: Diagnosis present

## 2015-05-23 DIAGNOSIS — K219 Gastro-esophageal reflux disease without esophagitis: Secondary | ICD-10-CM | POA: Diagnosis present

## 2015-05-23 DIAGNOSIS — Z8546 Personal history of malignant neoplasm of prostate: Secondary | ICD-10-CM | POA: Diagnosis not present

## 2015-05-23 DIAGNOSIS — G4730 Sleep apnea, unspecified: Secondary | ICD-10-CM | POA: Diagnosis not present

## 2015-05-23 DIAGNOSIS — Z85048 Personal history of other malignant neoplasm of rectum, rectosigmoid junction, and anus: Secondary | ICD-10-CM

## 2015-05-23 DIAGNOSIS — Z87891 Personal history of nicotine dependence: Secondary | ICD-10-CM | POA: Diagnosis not present

## 2015-05-23 DIAGNOSIS — M1710 Unilateral primary osteoarthritis, unspecified knee: Secondary | ICD-10-CM

## 2015-05-23 HISTORY — PX: TOTAL KNEE ARTHROPLASTY: SHX125

## 2015-05-23 HISTORY — DX: Presence of unspecified artificial knee joint: Z96.659

## 2015-05-23 HISTORY — DX: Unilateral primary osteoarthritis, unspecified knee: M17.10

## 2015-05-23 LAB — GLUCOSE, CAPILLARY
Glucose-Capillary: 90 mg/dL (ref 65–99)
Glucose-Capillary: 89 mg/dL (ref 65–99)

## 2015-05-23 SURGERY — ARTHROPLASTY, KNEE, TOTAL
Anesthesia: Spinal | Site: Knee | Laterality: Left

## 2015-05-23 MED ORDER — HYDROMORPHONE HCL 1 MG/ML IJ SOLN
0.2500 mg | INTRAMUSCULAR | Status: DC | PRN
  Administered 2015-05-23 (×4): 0.5 mg via INTRAVENOUS

## 2015-05-23 MED ORDER — PROPOFOL 10 MG/ML IV BOLUS
INTRAVENOUS | Status: AC
  Filled 2015-05-23: qty 20

## 2015-05-23 MED ORDER — SODIUM CHLORIDE 0.9 % IJ SOLN
INTRAMUSCULAR | Status: DC | PRN
  Administered 2015-05-23: 30 mL via INTRAVENOUS

## 2015-05-23 MED ORDER — OXYMETAZOLINE HCL 0.05 % NA SOLN
1.0000 | Freq: Every day | NASAL | Status: DC
  Administered 2015-05-23 – 2015-05-24 (×2): 1 via NASAL
  Filled 2015-05-23: qty 15

## 2015-05-23 MED ORDER — SENNA 8.6 MG PO TABS
1.0000 | ORAL_TABLET | Freq: Two times a day (BID) | ORAL | Status: DC
  Administered 2015-05-23 – 2015-05-25 (×4): 8.6 mg via ORAL

## 2015-05-23 MED ORDER — PROPOFOL 10 MG/ML IV BOLUS
INTRAVENOUS | Status: AC
  Filled 2015-05-23: qty 40

## 2015-05-23 MED ORDER — METOCLOPRAMIDE HCL 10 MG PO TABS: 5.0000 mg | ORAL_TABLET | Freq: Three times a day (TID) | ORAL | Status: DC | PRN

## 2015-05-23 MED ORDER — ONDANSETRON HCL 4 MG/2ML IJ SOLN: 4.0000 mg | Freq: Four times a day (QID) | INTRAMUSCULAR | Status: DC | PRN

## 2015-05-23 MED ORDER — FERROUS SULFATE 325 (65 FE) MG PO TABS
325.0000 mg | ORAL_TABLET | Freq: Three times a day (TID) | ORAL | Status: DC
  Administered 2015-05-24 – 2015-05-25 (×4): 325 mg via ORAL
  Filled 2015-05-23 (×7): qty 1

## 2015-05-23 MED ORDER — METHOCARBAMOL 500 MG PO TABS: 500.0000 mg | ORAL_TABLET | Freq: Four times a day (QID) | ORAL | Status: DC | PRN

## 2015-05-23 MED ORDER — MAGNESIUM CITRATE PO SOLN: 1.0000 | Freq: Once | ORAL | Status: DC | PRN

## 2015-05-23 MED ORDER — OXYCODONE HCL 5 MG PO TABS
5.0000 mg | ORAL_TABLET | ORAL | Status: DC | PRN
  Administered 2015-05-23 (×2): 5 mg via ORAL
  Administered 2015-05-24 – 2015-05-25 (×6): 10 mg via ORAL
  Filled 2015-05-23 (×3): qty 2
  Filled 2015-05-23: qty 1
  Filled 2015-05-23 (×2): qty 2
  Filled 2015-05-23: qty 1
  Filled 2015-05-23: qty 2

## 2015-05-23 MED ORDER — DIPHENHYDRAMINE HCL 12.5 MG/5ML PO ELIX: 12.5000 mg | ORAL_SOLUTION | ORAL | Status: DC | PRN

## 2015-05-23 MED ORDER — OXYCODONE-ACETAMINOPHEN 5-325 MG PO TABS: 1.0000 | ORAL_TABLET | ORAL | Status: DC | PRN

## 2015-05-23 MED ORDER — LIDOCAINE HCL (CARDIAC) 20 MG/ML IV SOLN
INTRAVENOUS | Status: AC
  Filled 2015-05-23: qty 5

## 2015-05-23 MED ORDER — FENTANYL CITRATE (PF) 100 MCG/2ML IJ SOLN
50.0000 ug | INTRAMUSCULAR | Status: DC | PRN
  Administered 2015-05-23: 50 ug via INTRAVENOUS

## 2015-05-23 MED ORDER — MENTHOL 3 MG MT LOZG: 1.0000 | LOZENGE | OROMUCOSAL | Status: DC | PRN

## 2015-05-23 MED ORDER — DEXAMETHASONE SODIUM PHOSPHATE 10 MG/ML IJ SOLN
10.0000 mg | Freq: Once | INTRAMUSCULAR | Status: AC
  Administered 2015-05-23: 10 mg via INTRAVENOUS

## 2015-05-23 MED ORDER — KETOROLAC TROMETHAMINE 30 MG/ML IJ SOLN
INTRAMUSCULAR | Status: AC
  Filled 2015-05-23: qty 1

## 2015-05-23 MED ORDER — FENTANYL CITRATE (PF) 100 MCG/2ML IJ SOLN
INTRAMUSCULAR | Status: AC
  Filled 2015-05-23: qty 2

## 2015-05-23 MED ORDER — ACETAMINOPHEN 650 MG RE SUPP: 650.0000 mg | Freq: Four times a day (QID) | RECTAL | Status: DC | PRN

## 2015-05-23 MED ORDER — PHENOL 1.4 % MT LIQD: 1.0000 | OROMUCOSAL | Status: DC | PRN

## 2015-05-23 MED ORDER — DEXAMETHASONE SODIUM PHOSPHATE 10 MG/ML IJ SOLN
10.0000 mg | Freq: Once | INTRAMUSCULAR | Status: AC
  Administered 2015-05-24: 10 mg via INTRAVENOUS
  Filled 2015-05-23: qty 1

## 2015-05-23 MED ORDER — LACTATED RINGERS IV SOLN
INTRAVENOUS | Status: DC
  Administered 2015-05-23: 1000 mL via INTRAVENOUS

## 2015-05-23 MED ORDER — HYDROMORPHONE HCL 1 MG/ML IJ SOLN
INTRAMUSCULAR | Status: AC
  Filled 2015-05-23: qty 1

## 2015-05-23 MED ORDER — MAGNESIUM OXIDE 400 (241.3 MG) MG PO TABS
400.0000 mg | ORAL_TABLET | Freq: Every day | ORAL | Status: DC
  Administered 2015-05-24 – 2015-05-25 (×2): 400 mg via ORAL
  Filled 2015-05-23 (×2): qty 1

## 2015-05-23 MED ORDER — AMLODIPINE BESYLATE 5 MG PO TABS
5.0000 mg | ORAL_TABLET | Freq: Every day | ORAL | Status: DC
  Administered 2015-05-24 – 2015-05-25 (×2): 5 mg via ORAL
  Filled 2015-05-23 (×2): qty 1

## 2015-05-23 MED ORDER — LACTATED RINGERS IV SOLN
INTRAVENOUS | Status: DC
  Administered 2015-05-23: 18:00:00 via INTRAVENOUS

## 2015-05-23 MED ORDER — ALLOPURINOL 300 MG PO TABS
300.0000 mg | ORAL_TABLET | Freq: Every day | ORAL | Status: DC
  Administered 2015-05-24 – 2015-05-25 (×2): 300 mg via ORAL
  Filled 2015-05-23 (×2): qty 1

## 2015-05-23 MED ORDER — BUPIVACAINE-EPINEPHRINE (PF) 0.25% -1:200000 IJ SOLN
INTRAMUSCULAR | Status: AC
  Filled 2015-05-23: qty 30

## 2015-05-23 MED ORDER — OXYMETAZOLINE HCL 0.05 % NA SOLN
NASAL | Status: AC
  Filled 2015-05-23: qty 15

## 2015-05-23 MED ORDER — OXYMETAZOLINE HCL 0.05 % NA SOLN
1.0000 | Freq: Two times a day (BID) | NASAL | Status: DC
Start: 2015-05-23 — End: 2015-05-23
  Administered 2015-05-23: 1 via NASAL
  Filled 2015-05-23: qty 15

## 2015-05-23 MED ORDER — CEFAZOLIN SODIUM-DEXTROSE 2-3 GM-% IV SOLR: 2.0000 g | INTRAVENOUS | Status: DC

## 2015-05-23 MED ORDER — METHOCARBAMOL 1000 MG/10ML IJ SOLN
500.0000 mg | Freq: Four times a day (QID) | INTRAVENOUS | Status: DC | PRN
  Administered 2015-05-23: 500 mg via INTRAVENOUS
  Filled 2015-05-23 (×2): qty 5

## 2015-05-23 MED ORDER — ZOLPIDEM TARTRATE 5 MG PO TABS
5.0000 mg | ORAL_TABLET | Freq: Every evening | ORAL | Status: DC | PRN
  Administered 2015-05-23 – 2015-05-24 (×2): 5 mg via ORAL
  Filled 2015-05-23 (×2): qty 1

## 2015-05-23 MED ORDER — MIDAZOLAM HCL 2 MG/2ML IJ SOLN
INTRAMUSCULAR | Status: AC
  Filled 2015-05-23: qty 2

## 2015-05-23 MED ORDER — METHOCARBAMOL 500 MG PO TABS
500.0000 mg | ORAL_TABLET | Freq: Four times a day (QID) | ORAL | Status: DC | PRN
  Administered 2015-05-23 – 2015-05-25 (×5): 500 mg via ORAL
  Filled 2015-05-23 (×7): qty 1

## 2015-05-23 MED ORDER — POTASSIUM GLUCONATE 595 MG PO CAPS: 1.0000 | ORAL_CAPSULE | Freq: Every day | ORAL | Status: DC

## 2015-05-23 MED ORDER — BISACODYL 5 MG PO TBEC: 5.0000 mg | DELAYED_RELEASE_TABLET | Freq: Every day | ORAL | Status: DC | PRN

## 2015-05-23 MED ORDER — ASPIRIN EC 325 MG PO TBEC: 325.0000 mg | DELAYED_RELEASE_TABLET | Freq: Two times a day (BID) | ORAL | Status: DC

## 2015-05-23 MED ORDER — SODIUM CHLORIDE 0.9 % IJ SOLN
INTRAMUSCULAR | Status: AC
  Filled 2015-05-23: qty 50

## 2015-05-23 MED ORDER — MIDAZOLAM HCL 5 MG/5ML IJ SOLN
INTRAMUSCULAR | Status: DC | PRN
  Administered 2015-05-23 (×2): 1 mg via INTRAVENOUS

## 2015-05-23 MED ORDER — FENTANYL CITRATE (PF) 100 MCG/2ML IJ SOLN
INTRAMUSCULAR | Status: DC | PRN
  Administered 2015-05-23: 50 ug via INTRAVENOUS

## 2015-05-23 MED ORDER — LIDOCAINE HCL 1 % IJ SOLN
INTRAMUSCULAR | Status: AC
  Filled 2015-05-23: qty 20

## 2015-05-23 MED ORDER — BUPIVACAINE-EPINEPHRINE 0.25% -1:200000 IJ SOLN
INTRAMUSCULAR | Status: DC | PRN
  Administered 2015-05-23: 30 mL

## 2015-05-23 MED ORDER — METOCLOPRAMIDE HCL 5 MG/ML IJ SOLN: 5.0000 mg | Freq: Three times a day (TID) | INTRAMUSCULAR | Status: DC | PRN

## 2015-05-23 MED ORDER — LIDOCAINE HCL (CARDIAC) 20 MG/ML IV SOLN
INTRAVENOUS | Status: DC | PRN
  Administered 2015-05-23: 100 mg via INTRAVENOUS

## 2015-05-23 MED ORDER — CEFAZOLIN SODIUM-DEXTROSE 2-3 GM-% IV SOLR
2.0000 g | Freq: Four times a day (QID) | INTRAVENOUS | Status: AC
  Administered 2015-05-23 – 2015-05-24 (×2): 2 g via INTRAVENOUS
  Filled 2015-05-23 (×2): qty 50

## 2015-05-23 MED ORDER — HYDROMORPHONE HCL 1 MG/ML IJ SOLN
1.0000 mg | INTRAMUSCULAR | Status: DC | PRN
  Administered 2015-05-23 – 2015-05-24 (×4): 1 mg via INTRAVENOUS
  Filled 2015-05-23 (×4): qty 1

## 2015-05-23 MED ORDER — ALUM & MAG HYDROXIDE-SIMETH 200-200-20 MG/5ML PO SUSP: 30.0000 mL | ORAL | Status: DC | PRN

## 2015-05-23 MED ORDER — ONDANSETRON HCL 4 MG PO TABS: 4.0000 mg | ORAL_TABLET | Freq: Four times a day (QID) | ORAL | Status: DC | PRN

## 2015-05-23 MED ORDER — SIMVASTATIN 20 MG PO TABS
20.0000 mg | ORAL_TABLET | Freq: Every day | ORAL | Status: DC
  Administered 2015-05-23 – 2015-05-24 (×2): 20 mg via ORAL
  Filled 2015-05-23 (×3): qty 1

## 2015-05-23 MED ORDER — PROPOFOL 500 MG/50ML IV EMUL
INTRAVENOUS | Status: DC | PRN
  Administered 2015-05-23: 35 ug/kg/min via INTRAVENOUS

## 2015-05-23 MED ORDER — METFORMIN HCL 500 MG PO TABS
500.0000 mg | ORAL_TABLET | Freq: Two times a day (BID) | ORAL | Status: DC
  Administered 2015-05-24 – 2015-05-25 (×3): 500 mg via ORAL
  Filled 2015-05-23 (×5): qty 1

## 2015-05-23 MED ORDER — POLYETHYLENE GLYCOL 3350 17 G PO PACK: 17.0000 g | PACK | Freq: Every day | ORAL | Status: DC | PRN

## 2015-05-23 MED ORDER — PANTOPRAZOLE SODIUM 40 MG PO TBEC
40.0000 mg | DELAYED_RELEASE_TABLET | Freq: Every day | ORAL | Status: DC
  Administered 2015-05-23 – 2015-05-25 (×3): 40 mg via ORAL
  Filled 2015-05-23 (×3): qty 1

## 2015-05-23 MED ORDER — SODIUM CHLORIDE 0.9 % IR SOLN
Status: DC | PRN
  Administered 2015-05-23: 1

## 2015-05-23 MED ORDER — ACETAMINOPHEN 325 MG PO TABS
650.0000 mg | ORAL_TABLET | Freq: Four times a day (QID) | ORAL | Status: DC | PRN
  Administered 2015-05-24: 650 mg via ORAL
  Filled 2015-05-23: qty 2

## 2015-05-23 MED ORDER — DEXTROSE 5 % IV SOLN
3.0000 g | Freq: Once | INTRAVENOUS | Status: AC
  Administered 2015-05-23: 3 g via INTRAVENOUS
  Filled 2015-05-23: qty 3000

## 2015-05-23 MED ORDER — ONDANSETRON HCL 4 MG/2ML IJ SOLN
INTRAMUSCULAR | Status: DC | PRN
  Administered 2015-05-23: 4 mg via INTRAVENOUS

## 2015-05-23 MED ORDER — POTASSIUM CHLORIDE IN NACL 20-0.9 MEQ/L-% IV SOLN
INTRAVENOUS | Status: DC
  Administered 2015-05-24: 06:00:00 via INTRAVENOUS
  Filled 2015-05-23 (×4): qty 1000

## 2015-05-23 MED ORDER — KETOROLAC TROMETHAMINE 30 MG/ML IJ SOLN
INTRAMUSCULAR | Status: DC | PRN
  Administered 2015-05-23: 30 mg

## 2015-05-23 MED ORDER — POVIDONE-IODINE 7.5 % EX SOLN: Freq: Once | CUTANEOUS | Status: DC

## 2015-05-23 MED ORDER — ENOXAPARIN SODIUM 30 MG/0.3ML ~~LOC~~ SOLN
30.0000 mg | Freq: Two times a day (BID) | SUBCUTANEOUS | Status: DC
  Administered 2015-05-24 – 2015-05-25 (×3): 30 mg via SUBCUTANEOUS
  Filled 2015-05-23 (×5): qty 0.3

## 2015-05-23 SURGICAL SUPPLY — 58 items
BAG DECANTER FOR FLEXI CONT (MISCELLANEOUS) IMPLANT
BAG SPEC THK2 15X12 ZIP CLS (MISCELLANEOUS) ×2
BAG ZIPLOCK 12X15 (MISCELLANEOUS) ×4 IMPLANT
BANDAGE ELASTIC 4 VELCRO ST LF (GAUZE/BANDAGES/DRESSINGS) ×2 IMPLANT
BANDAGE ELASTIC 6 VELCRO ST LF (GAUZE/BANDAGES/DRESSINGS) ×2 IMPLANT
BLADE SAG 18X100X1.27 (BLADE) ×2 IMPLANT
BLADE SAW SGTL 13.0X1.19X90.0M (BLADE) ×2 IMPLANT
BONE CEMENT GENTAMICIN (Cement) ×4 IMPLANT
CAP KNEE TOTAL 3 SIGMA ×1 IMPLANT
CEMENT BONE GENTAMICIN 40 (Cement) ×2 IMPLANT
CLOTH BEACON ORANGE TIMEOUT ST (SAFETY) ×2 IMPLANT
CUFF TOURN SGL QUICK 34 (TOURNIQUET CUFF) ×2
CUFF TRNQT CYL 34X4X40X1 (TOURNIQUET CUFF) ×1 IMPLANT
DECANTER SPIKE VIAL GLASS SM (MISCELLANEOUS) ×2 IMPLANT
DRAPE U-SHAPE 47X51 STRL (DRAPES) ×2 IMPLANT
DRSG AQUACEL AG ADV 3.5X10 (GAUZE/BANDAGES/DRESSINGS) ×2 IMPLANT
DRSG TEGADERM 4X4.75 (GAUZE/BANDAGES/DRESSINGS) ×2 IMPLANT
DURAPREP 26ML APPLICATOR (WOUND CARE) ×4 IMPLANT
ELECT REM PT RETURN 9FT ADLT (ELECTROSURGICAL) ×2
ELECTRODE REM PT RTRN 9FT ADLT (ELECTROSURGICAL) ×1 IMPLANT
EVACUATOR 1/8 PVC DRAIN (DRAIN) ×2 IMPLANT
GAUZE SPONGE 2X2 8PLY STRL LF (GAUZE/BANDAGES/DRESSINGS) ×1 IMPLANT
GLOVE BIOGEL PI IND STRL 8 (GLOVE) ×2 IMPLANT
GLOVE BIOGEL PI INDICATOR 8 (GLOVE) ×2
GLOVE SURG ORTHO 8.0 STRL STRW (GLOVE) ×2 IMPLANT
GLOVE SURG ORTHO 9.0 STRL STRW (GLOVE) ×2 IMPLANT
GLOVE SURG SS PI 7.5 STRL IVOR (GLOVE) ×2 IMPLANT
GOWN STRL REUS W/TWL XL LVL3 (GOWN DISPOSABLE) ×4 IMPLANT
HANDPIECE INTERPULSE COAX TIP (DISPOSABLE) ×2
IMMOBILIZER KNEE 20 (SOFTGOODS) ×2
IMMOBILIZER KNEE 20 THIGH 36 (SOFTGOODS) ×1 IMPLANT
LIQUID BAND (GAUZE/BANDAGES/DRESSINGS) ×2 IMPLANT
NS IRRIG 1000ML POUR BTL (IV SOLUTION) ×2 IMPLANT
PACK TOTAL KNEE CUSTOM (KITS) ×2 IMPLANT
POSITIONER SURGICAL ARM (MISCELLANEOUS) ×2 IMPLANT
SET HNDPC FAN SPRY TIP SCT (DISPOSABLE) ×1 IMPLANT
SET PAD KNEE POSITIONER (MISCELLANEOUS) ×2 IMPLANT
SPONGE GAUZE 2X2 STER 10/PKG (GAUZE/BANDAGES/DRESSINGS) ×1
SPONGE LAP 18X18 X RAY DECT (DISPOSABLE) IMPLANT
SPONGE SURGIFOAM ABS GEL 100 (HEMOSTASIS) ×3 IMPLANT
STOCKINETTE 6  STRL (DRAPES) ×1
STOCKINETTE 6 STRL (DRAPES) ×1 IMPLANT
SUCTION FRAZIER 12FR DISP (SUCTIONS) ×2 IMPLANT
SUT BONE WAX W31G (SUTURE) IMPLANT
SUT MNCRL AB 3-0 PS2 18 (SUTURE) ×2 IMPLANT
SUT VIC AB 1 CT1 27 (SUTURE) ×8
SUT VIC AB 1 CT1 27XBRD ANTBC (SUTURE) ×4 IMPLANT
SUT VIC AB 2-0 CT1 27 (SUTURE) ×8
SUT VIC AB 2-0 CT1 TAPERPNT 27 (SUTURE) ×4 IMPLANT
SUT VLOC 180 0 24IN GS25 (SUTURE) ×2 IMPLANT
SYR 50ML LL SCALE MARK (SYRINGE) ×2 IMPLANT
TAPE STRIPS DRAPE STRL (GAUZE/BANDAGES/DRESSINGS) ×2 IMPLANT
TOWER CARTRIDGE SMART MIX (DISPOSABLE) ×2 IMPLANT
TRAY FOLEY W/METER SILVER 14FR (SET/KITS/TRAYS/PACK) ×2 IMPLANT
TRAY FOLEY W/METER SILVER 16FR (SET/KITS/TRAYS/PACK) ×2 IMPLANT
WATER STERILE IRR 1500ML POUR (IV SOLUTION) ×4 IMPLANT
WRAP KNEE MAXI GEL POST OP (GAUZE/BANDAGES/DRESSINGS) ×2 IMPLANT
YANKAUER SUCT BULB TIP 10FT TU (MISCELLANEOUS) ×2 IMPLANT

## 2015-05-23 NOTE — Transfer of Care (Signed)
Immediate Anesthesia Transfer of Care Note  Patient: Marcus [name]  Procedure(s) Performed: Procedure(s): LEFT TOTAL KNEE ARTHROPLASTY (Left)  Patient Location: PACU  Anesthesia Type:MAC and Spinal  Level of Consciousness: awake, alert , oriented and patient cooperative  Airway & Oxygen Therapy: Patient Spontanous Breathing and Patient connected to face mask oxygen  Post-op Assessment: Report given to RN and Post -op Vital signs reviewed and stable  Post vital signs: Reviewed and stable  Last Vitals:  Filed Vitals:   05/23/15 1206  BP: 160/70  Pulse: 64  Temp:   Resp:     Complications: No apparent anesthesia complications

## 2015-05-23 NOTE — Op Note (Signed)
DATE OF SURGERY:  05/23/2015  TIME: 5:09 PM  PATIENT NAME:  Marcus [name]    AGE: 74 y.o.   PRE-OPERATIVE DIAGNOSIS:  LEFT KNEE OSTEOARTHRITIS  POST-OPERATIVE DIAGNOSIS:  LEFT KNEE OSTEOARTHRITIS  PROCEDURE:  Procedure(s): LEFT TOTAL KNEE ARTHROPLASTY  SURGEON:  Wildon Cuevas ANDREW  ASSISTANT:  Bryson Stilwell, PA-C, present and scrubbed throughout the case, critical for assistance with exposure, retraction, instrumentation, and closure.  OPERATIVE IMPLANTS: Depuy [employer] Sigma Rotating Platform.  Femur size 5, Tibia size 5, Patella size 42 3-peg oval button, with a 10 mm polyethylene insert.   PREOPERATIVE INDICATIONS:   [name] is a 74 y.o. year old male with end stage bone on bone arthritis of the knee who failed conservative treatment and elected for Total Knee Arthroplasty.   The risks, benefits, and alternatives were discussed at length including but not limited to the risks of infection, bleeding, nerve injury, stiffness, blood clots, the need for revision surgery, cardiopulmonary complications, among others, and they were willing to proceed.  OPERATIVE DESCRIPTION:  The patient was brought to the operative room and placed in a supine position.  Spinal anesthesia was administered.  IV antibiotics were given.  The lower extremity was prepped and draped in the usual sterile fashion.  Time out was performed.  The leg was elevated and exsanguinated and the tourniquet was inflated.  Anterior quadriceps tendon splitting approach was performed.  The patella was retracted and osteophytes were removed.  The anterior horn of the medial and lateral meniscus was removed and cruciate ligaments resected.   The distal femur was opened with the drill and the intramedullary distal femoral cutting jig was utilized, set at 5 degrees resecting 10 mm off the distal femur.  Care was taken to protect the collateral ligaments.  The distal femoral sizing jig was applied, taking care to  avoid notching.  Then the 4-in-1 cutting jig was applied and the anterior and posterior femur was cut, along with the chamfer cuts.    Then the extramedullary tibial cutting jig was utilized making the appropriate cut using the anterior tibial crest as a reference building in appropriate posterior slope.  Care was taken during the cut to protect the medial and collateral ligaments.  The proximal tibia was removed along with the posterior horns of the menisci.   The posterior medial femoral osteophytes and posterior lateral femoral osteophytes were removed.    The flexion gap was then measured and was symmetric with the extension gap, measured at 10.  I completed the distal femoral preparation using the appropriate jig to prepare the box.  The patella was then measured, and cut with the saw.    The proximal tibia sized and prepared accordingly with the reamer and the punch, and then all components were trialed with the trial insert.  The knee was found to have excellent balance and full motion.    The above named components were then cemented into place and all excess cement was removed.  The trial polyethylene component was in place during cementation, and then was exchanged for the real polyethylene component.    The knee was easily taken through a range of motion and the patella tracked well and the knee irrigated copiously and the parapatellar and subcutaneous tissue closed with vicryl, and monocryl with steri strips for the skin.  The arthrotomy was closed at 90 of flexion. The wounds were dressed with sterile gauze and the tourniquet released and the patient was awakened and returned to the PACU  in stable and satisfactory condition.  There were no complications.  Total tourniquet time was 90 minutes.

## 2015-05-23 NOTE — Interval H&P Note (Signed)
History and Physical Interval Note:  05/23/2015 2:27 PM  Marcus [name]  has presented today for surgery, with the diagnosis of LEFT KNEE OSTEOARTHRITIS  The various methods of treatment have been discussed with the patient and family. After consideration of risks, benefits and other options for treatment, the patient has consented to  Procedure(s): LEFT TOTAL KNEE ARTHROPLASTY (Left) as a surgical intervention .  The patient's history has been reviewed, patient examined, no change in status, stable for surgery.  I have reviewed the patient's chart and labs.  Questions were answered to the patient's satisfaction.     Adonia Porada ANDREW

## 2015-05-23 NOTE — Anesthesia Procedure Notes (Signed)
Procedure Name: MAC Date/Time: 05/23/2015 2:36 PM Performed by: Carleene Cooper A Pre-anesthesia Checklist: Patient identified, Timeout performed, Emergency Drugs available, Suction available and Patient being monitored Patient Re-evaluated:Patient Re-evaluated prior to inductionOxygen Delivery Method: Simple face mask Dental Injury: Teeth and Oropharynx as per pre-operative assessment

## 2015-05-23 NOTE — Anesthesia Preprocedure Evaluation (Addendum)
Anesthesia Evaluation  Patient identified by MRN, date of birth, ID band Patient awake    Reviewed: Allergy & Precautions, H&P , NPO status , Patient's Chart, lab work & pertinent test results  Airway Mallampati: II  TM Distance: >3 FB Neck ROM: Full    Dental no notable dental hx. (+) Dental Advisory Given   Pulmonary sleep apnea , former smoker,    Pulmonary exam normal breath sounds clear to auscultation       Cardiovascular hypertension, Pt. on medications + CAD  Normal cardiovascular exam Rhythm:Regular Rate:Normal  Grade 1 diastolic dysfunction   Neuro/Psych  Neuromuscular disease negative neurological ROS  negative psych ROS   GI/Hepatic negative GI ROS, Neg liver ROS, hiatal hernia, GERD  Medicated and Controlled,  Endo/Other  diabetes, Well Controlled, Type 2, Oral Hypoglycemic AgentsMorbid obesity  Renal/GU negative Renal ROS  negative genitourinary   Musculoskeletal negative musculoskeletal ROS (+)   Abdominal (+) + obese,   Peds negative pediatric ROS (+)  Hematology negative hematology ROS (+)   Anesthesia Other Findings   Reproductive/Obstetrics negative OB ROS                           Anesthesia Physical Anesthesia Plan  ASA: III  Anesthesia Plan: Spinal   Post-op Pain Management:    Induction:   Airway Management Planned: Simple Face Mask and Natural Airway  Additional Equipment:   Intra-op Plan:   Post-operative Plan:   Informed Consent:   Plan Discussed with: Surgeon  Anesthesia Plan Comments:        Anesthesia Quick Evaluation

## 2015-05-23 NOTE — Anesthesia Postprocedure Evaluation (Signed)
  Anesthesia Post-op Note  Patient: Marcus [name]  Procedure(s) Performed: Procedure(s) (LRB): LEFT TOTAL KNEE ARTHROPLASTY (Left)  Patient Location: PACU  Anesthesia Type: Spinal  Level of Consciousness: awake and alert   Airway and Oxygen Therapy: Patient Spontanous Breathing  Post-op Pain: mild  Post-op Assessment: Post-op Vital signs reviewed, Patient's Cardiovascular Status Stable, Respiratory Function Stable, Patent Airway and No signs of Nausea or vomiting  Last Vitals:  Filed Vitals:   05/23/15 1815  BP:   Pulse: 67  Temp:   Resp: 16    Post-op Vital Signs: stable   Complications: No apparent anesthesia complications

## 2015-05-24 LAB — BASIC METABOLIC PANEL
Anion gap: 6 (ref 5–15)
BUN: 18 mg/dL (ref 6–20)
CO2: 27 mmol/L (ref 22–32)
Calcium: 8.6 mg/dL — ABNORMAL LOW (ref 8.9–10.3)
Chloride: 104 mmol/L (ref 101–111)
Creatinine, Ser: 1.18 mg/dL (ref 0.61–1.24)
GFR calc Af Amer: >60 mL/min (ref >60)
GFR calc non Af Amer: 59 mL/min — ABNORMAL LOW (ref >60)
Glucose, Bld: 167 mg/dL — ABNORMAL HIGH (ref 65–99)
Potassium: 4.4 mmol/L (ref 3.5–5.1)
Sodium: 137 mmol/L (ref 135–145)

## 2015-05-24 LAB — CBC
HCT: 38.6 % — ABNORMAL LOW (ref 39.0–52.0)
Hemoglobin: 12.8 g/dL — ABNORMAL LOW (ref 13.0–17.0)
MCH: 28.8 pg (ref 26.0–34.0)
MCHC: 33.2 g/dL (ref 30.0–36.0)
MCV: 86.7 fL (ref 78.0–100.0)
Platelets: 250 10*3/uL (ref 150–400)
RBC: 4.45 MIL/uL (ref 4.22–5.81)
RDW: 14.6 % (ref 11.5–15.5)
WBC: 16.9 10*3/uL — ABNORMAL HIGH (ref 4.0–10.5)

## 2015-05-24 NOTE — Progress Notes (Addendum)
Physical Therapy Treatment Patient Details Name: Marcus [name] MRN: [id number] DOB: 04/06/1941 Today's Date: 05/24/2015    History of Present Illness L TKR;    PT Comments    Pt motivated and progressing well with mobility.  Pt hopeful for return home tomorrow.  Follow Up Recommendations  Home health PT     Equipment Recommendations  Rolling walker with 5" wheels - pt would benefit from WIDE RW for home use.   Recommendations for Other Services OT consult     Precautions / Restrictions Precautions Precautions: Knee;Fall Required Braces or Orthoses: Knee Immobilizer - Left Knee Immobilizer - Left: Discontinue once straight leg raise with < 10 degree lag (Pt performed IND SLR this pm) Restrictions Weight Bearing Restrictions: No Other Position/Activity Restrictions: WBAT    Mobility  Bed Mobility Overal bed mobility: Needs Assistance Bed Mobility: Supine to Sit     Supine to sit: Min guard     General bed mobility comments: cues for sequence and use of R LE to self assist  Transfers Overall transfer level: Needs assistance Equipment used: Rolling walker (2 wheeled) Transfers: Sit to/from Stand Sit to Stand: Min guard         General transfer comment: cues for LE management and use of UEs to self assist  Ambulation/Gait Ambulation/Gait assistance: Min assist;Min guard Ambulation Distance (Feet): 100 Feet Assistive device: Rolling walker (2 wheeled) Gait Pattern/deviations: Step-to pattern;Decreased step length - right;Decreased step length - left;Shuffle;Trunk flexed     General Gait Details: cues for sequence, posture and position from [employer]            Wheelchair Mobility    Modified Rankin (Stroke Patients Only)       Balance                                    Cognition Arousal/Alertness: Awake/alert Behavior During Therapy: WFL for tasks assessed/performed Overall Cognitive Status: Within Functional Limits for  tasks assessed                      Exercises Total Joint Exercises Ankle Circles/Pumps: AROM;Left;15 reps;Supine Quad Sets: AROM;Both;Supine;10 reps Heel Slides: AAROM;Left;10 reps;Supine Straight Leg Raises: Supine;AAROM;AROM;20 reps Goniometric ROM: AAROM at L knee -8 - 40    General Comments        Pertinent Vitals/Pain Pain Assessment: 0-10 Pain Score: 4  Pain Location: L knee Pain Descriptors / Indicators: Aching;Sore Pain Intervention(s): Limited activity within patient's tolerance;Monitored during session;Premedicated before session;Ice applied    Home Living                      Prior Function            PT Goals (current goals can now be found in the care plan section) Acute Rehab PT Goals Patient Stated Goal: Home and get this knee moving well enough to not need PT out pt PT Goal Formulation: With patient Time For Goal Achievement: 05/28/15 Potential to Achieve Goals: Good Progress towards PT goals: Progressing toward goals    Frequency  7X/week    PT Plan Current plan remains appropriate    Co-evaluation             End of Session Equipment Utilized During Treatment: Gait belt Activity Tolerance: Patient tolerated treatment well Patient left: in bed;with call bell/phone within reach     Time:  [id number] PT Time Calculation (min) (ACUTE ONLY): 34 min  Charges:  $Gait Training: 8-22 mins $Therapeutic Exercise: 8-22 mins                    G Codes:      Marcus [name] 2015-06-04, 2:52 PM

## 2015-05-24 NOTE — Evaluation (Signed)
Physical Therapy Evaluation Patient Details Name: Marcus [name] MRN: [id number] DOB: 05/02/1941 Today's Date: 05/24/2015   History of Present Illness  L TKR;  Clinical Impression  Pt s/p L TKR presents with decreased L LE strength/ROM and post op pain limiting functional mobility.  Pt should progress to dc home with family assist and HHPT follow up.    Follow Up Recommendations Home health PT    Equipment Recommendations  Rolling walker with 5" wheels    Recommendations for Other Services OT consult     Precautions / Restrictions Precautions Precautions: Knee;Fall Restrictions Weight Bearing Restrictions: No      Mobility  Bed Mobility Overal bed mobility: Needs Assistance Bed Mobility: Supine to Sit     Supine to sit: Min assist     General bed mobility comments: cues for sequence and use of R LE to self assist  Transfers Overall transfer level: Needs assistance Equipment used: Rolling walker (2 wheeled) Transfers: Sit to/from Stand Sit to Stand: Min assist         General transfer comment: cues for LE management and use of UEs to self assist  Ambulation/Gait Ambulation/Gait assistance: Min assist Ambulation Distance (Feet): 60 Feet Assistive device: Rolling walker (2 wheeled) Gait Pattern/deviations: Step-to pattern;Decreased step length - right;Decreased step length - left;Shuffle;Trunk flexed     General Gait Details: cues for sequence, posture and position from [employer]            Wheelchair Mobility    Modified Rankin (Stroke Patients Only)       Balance                                             Pertinent Vitals/Pain Pain Assessment: 0-10 Pain Score: 5  Pain Location: L knee Pain Descriptors / Indicators: Aching;Sore Pain Intervention(s): Limited activity within patient's tolerance;Monitored during session;Premedicated before session;Ice applied    Home Living Family/patient expects to be discharged  to:: Private residence Living Arrangements: Spouse/significant other;Children Available Help at Discharge: Family Type of Home: House Home Access: Stairs to enter Entrance Stairs-Rails: None Technical brewer of Steps: 3 Home Layout: One level Home Equipment: None      Prior Function Level of Independence: Independent               Hand Dominance        Extremity/Trunk Assessment   Upper Extremity Assessment: Overall WFL for tasks assessed           Lower Extremity Assessment: LLE deficits/detail   LLE Deficits / Details: 2/5 quads   Cervical / Trunk Assessment: Normal  Communication   Communication: No difficulties  Cognition Arousal/Alertness: Awake/alert Behavior During Therapy: WFL for tasks assessed/performed Overall Cognitive Status: Within Functional Limits for tasks assessed                      General Comments      Exercises Total Joint Exercises Ankle Circles/Pumps: AROM;Left;15 reps;Supine Quad Sets: AROM;Both;Supine;10 reps Straight Leg Raises: AAROM;10 reps;Supine      Assessment/Plan    PT Assessment Patient needs continued PT services  PT Diagnosis Difficulty walking   PT Problem List Decreased strength;Decreased range of motion;Decreased activity tolerance;Decreased mobility;Pain;Decreased knowledge of use of DME;Obesity  PT Treatment Interventions DME instruction;Gait training;Stair training;Functional mobility training;Therapeutic activities;Therapeutic exercise;Patient/family education   PT Goals (Current goals can be found  in the Care Plan section) Acute Rehab PT Goals Patient Stated Goal: Home and get this knee moving well enough to not need PT out pt PT Goal Formulation: With patient Time For Goal Achievement: 05/28/15 Potential to Achieve Goals: Good    Frequency 7X/week   Barriers to discharge        Co-evaluation               End of Session Equipment Utilized During Treatment: Gait belt;Left  knee immobilizer Activity Tolerance: Patient tolerated treatment well Patient left: in chair;with call bell/phone within reach Nurse Communication: Need for lift equipment         Time: [id number] PT Time Calculation (min) (ACUTE ONLY): 32 min   Charges:   PT Evaluation $Initial PT Evaluation Tier I: 1 Procedure PT Treatments $Gait Training: 8-22 mins   PT G Codes:        Holiday Mcmenamin June 16, 2015, 12:24 PM

## 2015-05-24 NOTE — Progress Notes (Signed)
OT Cancellation Note  Patient Details Name: Marcus [name] MRN: [id number] DOB: [birth year]   Cancelled Treatment:    Reason Eval/Treat Not Completed: Fatigue/lethargy limiting ability to participate;Pain limiting ability to participate  Will check on pt later in day or in the morning.  Mickel Baas [place], [place] 05/24/2015, 11:14 AM

## 2015-05-24 NOTE — Progress Notes (Signed)
Subjective: 1 Day Post-Op Procedure(s) (LRB): LEFT TOTAL KNEE ARTHROPLASTY (Left) Patient reports pain as 3 on 0-10 scale.    Objective: Vital signs in last 24 hours: Temp:  [96.6 F (35.9 C)-97.9 F (36.6 C)] 97.7 F (36.5 C) (11/19 0540) Pulse Rate:  [59-102] 102 (11/19 0540) Resp:  [12-19] 16 (11/19 0540) BP: (148-186)/(65-108) 154/91 mmHg (11/19 0540) SpO2:  [92 %-99 %] 97 % (11/19 0540) Weight:  [120.77 kg (266 lb 4 oz)] 120.77 kg (266 lb 4 oz) (11/18 1153)  Intake/Output from previous day: 11/18 0701 - 11/19 0700 In: 2795 [P.O.:240; I.V.:2450; IV Piggyback:105] Out: 1530 [Urine:1325; Drains:180; Blood:25] Intake/Output this shift:     Recent Labs  05/24/15 0355  HGB 12.8*    Recent Labs  05/24/15 0355  WBC 16.9*  RBC 4.45  HCT 38.6*  PLT 250    Recent Labs  05/24/15 0355  NA 137  K 4.4  CL 104  CO2 27  BUN 18  CREATININE 1.18  GLUCOSE 167*  CALCIUM 8.6*   No results for input(s): LABPT, INR in the last 72 hours.  Neurologically intact Sensation intact distally Dorsiflexion/Plantar flexion intact Compartment soft  Assessment/Plan: 1 Day Post-Op Procedure(s) (LRB): LEFT TOTAL KNEE ARTHROPLASTY (Left) Advance diet Up with therapy Plan for discharge tomorrow  Scant drainage. Drain D/C'd. Some bleeding at sight. Reinforced Dressing.  Arif Amendola C 05/24/2015, 8:56 AM

## 2015-05-24 NOTE — Clinical Social Work Note (Signed)
CSW met with pt who stated that he is going home and has HHPT set up.  Equipment at bedside  CSW signing off  .Dede Query, LCSW [facility] Clinical Social Worker - Weekend Coverage cell #: [phone]

## 2015-05-24 NOTE — Care Management Note (Signed)
Case Management Note  Patient Details  Name: Marcus [name] MRN: [id number] Date of Birth: 06-04-1941  Subjective/Objective:                  LEFT TOTAL KNEE ARTHROPLASTY   Action/Plan: CM spoke with the patient at the bedside. Patient needs a RW and 3N1. [name] was arranged pre-operatively for HHPT. Patient agrees for [name] to provide services. [name] at [facility] notified for DME request.   Expected Discharge Date:                  Expected Discharge Plan:  [place]  In-House Referral:     Discharge planning Services  CM Consult  Post Acute Care Choice:  Home Health Choice offered to:  Patient  DME Arranged:  Walker rolling, 3-N-1 DME Agency:     HH Arranged:  PT HH Agency:  [facility]  Status of Service:  Completed, signed off  Medicare Important Message Given:    Date Medicare IM Given:    Medicare IM give by:    Date Additional Medicare IM Given:    Additional Medicare Important Message give by:     If discussed at [place] of Stay Meetings, dates discussed:    Additional Comments:  [name], RN 05/24/2015, 11:50 AM

## 2015-05-25 LAB — CBC
HCT: 33 % — ABNORMAL LOW (ref 39.0–52.0)
Hemoglobin: 10.8 g/dL — ABNORMAL LOW (ref 13.0–17.0)
MCH: 28.6 pg (ref 26.0–34.0)
MCHC: 32.7 g/dL (ref 30.0–36.0)
MCV: 87.3 fL (ref 78.0–100.0)
Platelets: 224 10*3/uL (ref 150–400)
RBC: 3.78 MIL/uL — ABNORMAL LOW (ref 4.22–5.81)
RDW: 15.1 % (ref 11.5–15.5)
WBC: 12.8 10*3/uL — ABNORMAL HIGH (ref 4.0–10.5)

## 2015-05-25 MED ORDER — POLYETHYLENE GLYCOL 3350 17 G PO PACK: 17.0000 g | PACK | Freq: Every day | ORAL | Status: DC | PRN

## 2015-05-25 MED ORDER — OXYCODONE HCL 5 MG PO TABS: 5.0000 mg | ORAL_TABLET | ORAL | Status: DC | PRN

## 2015-05-25 MED ORDER — FERROUS SULFATE 325 (65 FE) MG PO TABS: 325.0000 mg | ORAL_TABLET | Freq: Three times a day (TID) | ORAL | Status: DC

## 2015-05-25 MED ORDER — ACETAMINOPHEN 500 MG PO TABS: 1000.0000 mg | ORAL_TABLET | Freq: Three times a day (TID) | ORAL | Status: DC | PRN

## 2015-05-25 NOTE — Progress Notes (Signed)
Physical Therapy Treatment Patient Details Name: Marcus [name] MRN: [id number] DOB: [birth year] Today's Date: 05/25/2015    History of Present Illness L TKR;    PT Comments    POD # 2 pt eager to "go home".  Applied KI and instructed on use for stairs and amb.  Assisted with amb in hallway.  Practiced going up 4 steps backward with walker due to no rail.  Second time, had son and spouse perform under therapist direction.  Using "teach back" method, instructed on KI proper use and proper application.  Handout given on stairs and HEP.  Instructed on TE freq and use of ICE.   Pt ready for D/C to home.   Follow Up Recommendations  Home health PT     Equipment Recommendations  Rolling walker with 5" wheels    Recommendations for Other Services       Precautions / Restrictions Precautions Precautions: Knee;Fall Precaution Comments: instructed pt/spouse and son on KI use for amb and stairs plus proper donning/doffing Required Braces or Orthoses: Knee Immobilizer - Left Knee Immobilizer - Left: Discontinue once straight leg raise with < 10 degree lag Restrictions Weight Bearing Restrictions: No Other Position/Activity Restrictions: WBAT    Mobility  Bed Mobility               General bed mobility comments: Pt OOB in recliner  Transfers Overall transfer level: Needs assistance Equipment used: Rolling walker (2 wheeled) Transfers: Sit to/from Stand Sit to Stand: Supervision;Min guard         General transfer comment: one VC on safety with turns as pt demon some impulsiveness  Ambulation/Gait Ambulation/Gait assistance: Supervision;Min guard Ambulation Distance (Feet): 80 Feet Assistive device: Rolling walker (2 wheeled) Gait Pattern/deviations: Step-to pattern;Trunk flexed     General Gait Details: 25% VC's on safety with turns and proper walker to self distance.     Stairs Stairs: Yes Stairs assistance: Min assist Stair Management: No rails;Step to  pattern;Backwards;With walker Number of Stairs: 4 General stair comments: performed twice.  50% VC's on proper tech and sequencing.  Instructed to wear KI and performed hand on family education with son and spouse.   Wheelchair Mobility    Modified Rankin (Stroke Patients Only)       Balance                                    Cognition Arousal/Alertness: Awake/alert Behavior During Therapy: WFL for tasks assessed/performed                        Exercises      General Comments        Pertinent Vitals/Pain Pain Assessment: 0-10 Pain Score: 3  Pain Location: L knee Pain Intervention(s): Monitored during session;Repositioned;Premedicated before session;Ice applied    Home Living                      Prior Function            PT Goals (current goals can now be found in the care plan section) Progress towards PT goals: Progressing toward goals    Frequency  7X/week    PT Plan Current plan remains appropriate    Co-evaluation             End of Session Equipment Utilized During Treatment: Gait belt Activity Tolerance: Patient tolerated treatment well Patient  left: in chair;with family/visitor present     Time: 1040-1105 PT Time Calculation (min) (ACUTE ONLY): 25 min  Charges:  $Gait Training: 8-22 mins $Therapeutic Activity: 8-22 mins                    G Codes:      Rica Koyanagi  PTA [facility]  Acute  Rehab Pager      [phone]

## 2015-05-25 NOTE — Progress Notes (Signed)
     Subjective: 2 Days Post-Op Procedure(s) (LRB): LEFT TOTAL KNEE ARTHROPLASTY (Left)   Patient reports pain as mild, pain controlled.  No events throughout the night. Ready to be discharged home if he does well with PT and pain stays controlled.  Objective:   VITALS:   Filed Vitals:   05/25/15 0638  BP: 156/70  Pulse: 80  Temp: 98.4 F (36.9 C)  Resp: 18    Dorsiflexion/Plantar flexion intact Incision: dressing C/D/I No cellulitis present Compartment soft  LABS  Recent Labs  05/24/15 0355 05/25/15 0604  HGB 12.8* 10.8*  HCT 38.6* 33.0*  WBC 16.9* 12.8*  PLT 250 224     Recent Labs  05/24/15 0355  NA 137  K 4.4  BUN 18  CREATININE 1.18  GLUCOSE 167*     Assessment/Plan: 2 Days Post-Op Procedure(s) (LRB): LEFT TOTAL KNEE ARTHROPLASTY (Left) Up with therapy Discharge home with home health  Follow up in 2 weeks at [facility] Follow up with Dr. Theda Sers in 2 weeks.  Contact information:  [facility] [address], [address] [place] Marsela Kuan   PAC  05/25/2015, 8:49 AM

## 2015-05-25 NOTE — Progress Notes (Signed)
Utilization Review Completed.Marcus [name] T11/20/2016  

## 2015-05-25 NOTE — Progress Notes (Signed)
Discharge instructions discussed with patient and family until no further questions ask.  Matt Babish PA to call in Oxycodone 15 mg for pain to [facility] per patient request. Percocet script destroyed.

## 2015-05-25 NOTE — Progress Notes (Signed)
Occupational Therapy Evaluation Patient Details Name: Marcus [name] MRN: [id number] DOB: Apr 06, 1941 Today's Date: 05/25/2015    History of Present Illness L TKR;   Clinical Impression   Patient presents to OT with decreased ADL independence and safety due to the deficits listed below. He will benefit from skilled OT to maximize independence. All education has been completed in anticipation of discharge home today. Patient will have family assistance 24/7 at discharge.     Follow Up Recommendations  No OT follow up;Supervision/Assistance - 24 hour    Equipment Recommendations  3 in 1 bedside comode (has already been delivered to patient's room)    Recommendations for Other Services PT consult     Precautions / Restrictions Precautions Precautions: Knee;Fall Required Braces or Orthoses: Knee Immobilizer - Left Knee Immobilizer - Left: Discontinue once straight leg raise with < 10 degree lag Restrictions Weight Bearing Restrictions: No Other Position/Activity Restrictions: WBAT      Mobility Bed Mobility Overal bed mobility: Needs Assistance Bed Mobility: Supine to Sit     Supine to sit: Min assist        Transfers Overall transfer level: Needs assistance Equipment used: Rolling walker (2 wheeled) Transfers: Sit to/from Stand Sit to Stand: Min guard              Balance                                            ADL Overall ADL's : Needs assistance/impaired Eating/Feeding: Independent;Sitting;Bed level       Upper Body Bathing: Set up;Sitting   Lower Body Bathing: Moderate assistance;Sit to/from stand   Upper Body Dressing : Set up;Sitting   Lower Body Dressing: Moderate assistance;Sit to/from stand   Toilet Transfer: Min guard;Ambulation;BSC;RW   Toileting- Clothing Manipulation and Hygiene: Minimal assistance;Sit to/from stand   Tub/ Shower Transfer: Walk-in shower;Minimal assistance;Ambulation;Shower seat;Rolling  walker   Functional mobility during ADLs: Min guard;Rolling walker General ADL Comments: Patient in bed; motivated to work with therapy. Performed bed mobility min A overall. Educated patient on LB bathing/dressing techniques. His family will be with him 24/7 so he states he will have family assist with those tasks and denies need for AE education. Practiced toilet and walk-in shower transfers. Patient has built-in shower seat but educated on use of 3 in 1 as shower seat and to have family move it to/from shower. Also educated patient to have family present during showers for safety. Patient verbalized understanding. Patient up to recliner at end of session.     Vision     Perception     Praxis      Pertinent Vitals/Pain Pain Assessment: 0-10 Pain Score: 4  Pain Location: L knee Pain Descriptors / Indicators: Aching;Sore Pain Intervention(s): Limited activity within patient's tolerance;Monitored during session     Hand Dominance Right   Extremity/Trunk Assessment Upper Extremity Assessment Upper Extremity Assessment: Overall WFL for tasks assessed   Lower Extremity Assessment Lower Extremity Assessment: Defer to PT evaluation   Cervical / Trunk Assessment Cervical / Trunk Assessment: Normal   Communication Communication Communication: No difficulties   Cognition Arousal/Alertness: Awake/alert Behavior During Therapy: WFL for tasks assessed/performed Overall Cognitive Status: Within Functional Limits for tasks assessed                     General Comments  Exercises       Shoulder Instructions      Home Living Family/patient expects to be discharged to:: Private residence Living Arrangements: Spouse/significant other;Children Available Help at Discharge: Family;Available 24 hours/day Type of Home: House Home Access: Stairs to enter [employer] of Steps: 3 Entrance Stairs-Rails: None Home Layout: One level     Bathroom Shower/Tub:  Occupational psychologist: Handicapped height Bathroom Accessibility: Yes How Accessible: Accessible via walker Home Equipment: None          Prior Functioning/Environment Level of Independence: Independent             OT Diagnosis: Acute pain   OT Problem List: Decreased strength;Decreased range of motion;Decreased knowledge of use of DME or AE;Pain   OT Treatment/Interventions: Self-care/ADL training;DME and/or AE instruction;Therapeutic activities;Patient/family education    OT Goals(Current goals can be found in the care plan section) Acute Rehab OT Goals Patient Stated Goal: to be able to tend to his garden in March OT Goal Formulation: With patient Time For Goal Achievement: 06/08/15 Potential to Achieve Goals: Good  OT Frequency: Min 2X/week   Barriers to D/C:            Co-evaluation              End of Session Equipment Utilized During Treatment: Rolling walker;Left knee immobilizer Nurse Communication: Mobility status;Other (comment) (disconnect IV for treatment session)  Activity Tolerance: Patient tolerated treatment well Patient left: in chair;with call bell/phone within reach   Time: [id number] OT Time Calculation (min): 26 min Charges:  OT General Charges $OT Visit: 1 Procedure OT Evaluation $Initial OT Evaluation Tier I: 1 Procedure OT Treatments $Self Care/Home Management : 8-22 mins G-Codes:    Shyler Hamill A May 29, 2015, 10:56 AM

## 2015-05-26 ENCOUNTER — Encounter (HOSPITAL_COMMUNITY): Payer: Self-pay | Admitting: Specialist

## 2015-05-26 DIAGNOSIS — Z8546 Personal history of malignant neoplasm of prostate: Secondary | ICD-10-CM | POA: Diagnosis not present

## 2015-05-26 DIAGNOSIS — Z7982 Long term (current) use of aspirin: Secondary | ICD-10-CM | POA: Diagnosis not present

## 2015-05-26 DIAGNOSIS — M109 Gout, unspecified: Secondary | ICD-10-CM | POA: Diagnosis not present

## 2015-05-26 DIAGNOSIS — E669 Obesity, unspecified: Secondary | ICD-10-CM | POA: Diagnosis not present

## 2015-05-26 DIAGNOSIS — Z471 Aftercare following joint replacement surgery: Secondary | ICD-10-CM | POA: Diagnosis not present

## 2015-05-26 DIAGNOSIS — R739 Hyperglycemia, unspecified: Secondary | ICD-10-CM | POA: Diagnosis not present

## 2015-05-26 DIAGNOSIS — K219 Gastro-esophageal reflux disease without esophagitis: Secondary | ICD-10-CM | POA: Diagnosis not present

## 2015-05-26 DIAGNOSIS — Z96652 Presence of left artificial knee joint: Secondary | ICD-10-CM | POA: Diagnosis not present

## 2015-05-26 DIAGNOSIS — Z6839 Body mass index (BMI) 39.0-39.9, adult: Secondary | ICD-10-CM | POA: Diagnosis not present

## 2015-05-26 DIAGNOSIS — G4733 Obstructive sleep apnea (adult) (pediatric): Secondary | ICD-10-CM | POA: Diagnosis not present

## 2015-05-26 DIAGNOSIS — I10 Essential (primary) hypertension: Secondary | ICD-10-CM | POA: Diagnosis not present

## 2015-05-26 DIAGNOSIS — Z85048 Personal history of other malignant neoplasm of rectum, rectosigmoid junction, and anus: Secondary | ICD-10-CM | POA: Diagnosis not present

## 2015-05-26 DIAGNOSIS — D509 Iron deficiency anemia, unspecified: Secondary | ICD-10-CM | POA: Diagnosis not present

## 2015-05-26 NOTE — Discharge Summary (Signed)
Physician Discharge Summary  Patient ID: Marcus [name] MRN: [id number] DOB/AGE: [birth year] 74 y.o.  Admit date: 05/23/2015 Discharge date: 05/25/2015  Admission Diagnoses:Left knee OA  Discharge Diagnoses:  Active Problems:   Primary osteoarthritis of knee   S/P knee replacement   Discharged Condition: Good  Hospital Course:  Marcus [name] is a 74 y.o. who was admitted to [facility]. They were brought to the operating room on 05/23/2015 and underwent Procedure(s): LEFT TOTAL KNEE ARTHROPLASTY.  Patient tolerated the procedure well and was later transferred to the recovery room and then to the orthopaedic floor for postoperative care.  They were given PO and IV analgesics for pain control following their surgery.  They were given 24 hours of postoperative antibiotics of  Anti-infectives    Start     Dose/Rate Route Frequency Ordered Stop   05/23/15 2100  ceFAZolin (ANCEF) IVPB 2 g/50 mL premix     2 g 100 mL/hr over 30 Minutes Intravenous Every 6 hours 05/23/15 1907 05/24/15 0354   05/23/15 1330  ceFAZolin (ANCEF) 3 g in dextrose 5 % 50 mL IVPB     3 g 160 mL/hr over 30 Minutes Intravenous  Once 05/23/15 1312 05/23/15 1455   05/23/15 1134  ceFAZolin (ANCEF) IVPB 2 g/50 mL premix  Status:  Discontinued     2 g 100 mL/hr over 30 Minutes Intravenous On call to O.R. 05/23/15 1134 05/23/15 1312     and started on DVT prophylaxis in the form of Lovenox.   PT and OT were ordered for total joint protocol.  Discharge planning consulted to help with postop disposition and equipment needs.  Patient had a good night on the evening of surgery and started to get up OOB with therapy on day one.  Hemovac drain was pulled without difficulty.  Continued to work with therapy into day two.  Dressing was with normal limits.  The patient had progressed with therapy and meeting their goals. Patient was seen in rounds and was ready to go home.  Consults: n/a  Significant Diagnostic Studies:  routine  Treatments: routine  Discharge Exam: Blood pressure 165/66, pulse 80, temperature 98.4 F (36.9 C), temperature source Oral, resp. rate 18, height 5\' 9"  (1.753 m), weight 120.77 kg (266 lb 4 oz), SpO2 92 %. Alert and oriented x3. RRR, Lungs clear, BS x4. Left Calf soft and non tender. L knee dressing C/D/I. No DVT signs. No signs of infection or compartment syndrome. LLE grossly neurovascularly intact.  Disposition: 06-Home-Health Care Svc      Discharge Instructions    Call MD / Call 911    Complete by:  As directed   If you experience chest pain or shortness of breath, CALL 911 and be transported to the hospital emergency room.  If you develope a fever above 101 F, pus (white drainage) or increased drainage or redness at the wound, or calf pain, call your surgeon's office.     Constipation Prevention    Complete by:  As directed   Drink plenty of fluids.  Prune juice may be helpful.  You may use a stool softener, such as Colace (over the counter) 100 mg twice a day.  Use MiraLax (over the counter) for constipation as needed.     Diet - low sodium heart healthy    Complete by:  As directed      Discharge instructions    Complete by:  As directed   [place]  items at home which could result in a fall. This includes throw rugs or furniture in walking pathways ICE to the affected joint every three hours while awake for 30 minutes at a time, for at least the first 3-5 days, and then as needed for pain and swelling.  Continue to use ice for pain and swelling. You may notice swelling that will progress down to the foot and ankle.  This is normal after surgery.  Elevate your leg when you are not up walking on it.   Continue to use the breathing machine you got in the hospital (incentive spirometer) which will help keep your temperature down.  It is common for your temperature to cycle up and down following surgery, especially at night when you are not  up moving around and exerting yourself.  The breathing machine keeps your lungs expanded and your temperature down.   DIET:  As you were doing prior to hospitalization, we recommend a well-balanced diet.  DRESSING / WOUND CARE / SHOWERING  Keep the surgical dressing until follow up.  The dressing is water proof, so you can shower without any extra covering.  IF THE DRESSING FALLS OFF or the wound gets wet inside, change the dressing with sterile gauze.  Please use good hand washing techniques before changing the dressing.  Do not use any lotions or creams on the incision until instructed by your surgeon.    ACTIVITY  Increase activity slowly as tolerated, but follow the weight bearing instructions below.   No driving for 6 weeks or until further direction given by your physician.  You cannot drive while taking narcotics.  No lifting or carrying greater than 10 lbs. until further directed by your surgeon. Avoid periods of inactivity such as sitting longer than an hour when not asleep. This helps prevent blood clots.  You may return to work once you are authorized by your doctor.     WEIGHT BEARING   Weight bearing as tolerated with assist device (walker, cane, etc) as directed, use it as long as suggested by your surgeon or therapist, typically at least 4-6 weeks.   EXERCISES  Results after joint replacement surgery are often greatly improved when you follow the exercise, range of motion and muscle strengthening exercises prescribed by your doctor. Safety measures are also important to protect the joint from further injury. Any time any of these exercises cause you to have increased pain or swelling, decrease what you are doing until you are comfortable again and then slowly increase them. If you have problems or questions, call your caregiver or physical therapist for advice.   Rehabilitation is important following a joint replacement. After just a few days of immobilization, the muscles  of the leg can become weakened and shrink (atrophy).  These exercises are designed to build up the tone and strength of the thigh and leg muscles and to improve motion. Often times heat used for twenty to thirty minutes before working out will loosen up your tissues and help with improving the range of motion but do not use heat for the first two weeks following surgery (sometimes heat can increase post-operative swelling).   These exercises can be done on a training (exercise) mat, on the floor, on a table or on a bed. Use whatever works the best and is most comfortable for you.    Use music or television while you are exercising so that the exercises are a pleasant break in your day. This will make your life  better with the exercises acting as a break in your routine that you can look forward to.   Perform all exercises about fifteen times, three times per day or as directed.  You should exercise both the operative leg and the other leg as well.   Exercises include:   Quad Sets - Tighten up the muscle on the front of the thigh (Quad) and hold for 5-10 seconds.   Straight Leg Raises - With your knee straight (if you were given a brace, keep it on), lift the leg to 60 degrees, hold for 3 seconds, and slowly lower the leg.  Perform this exercise against resistance later as your leg gets stronger.  Leg Slides: Lying on your back, slowly slide your foot toward your buttocks, bending your knee up off the floor (only go as far as is comfortable). Then slowly slide your foot back down until your leg is flat on the floor again.  Angel Wings: Lying on your back spread your legs to the side as far apart as you can without causing discomfort.  Hamstring Strength:  Lying on your back, push your heel against the floor with your leg straight by tightening up the muscles of your buttocks.  Repeat, but this time bend your knee to a comfortable angle, and push your heel against the floor.  You may put a pillow under the  heel to make it more comfortable if necessary.   A rehabilitation program following joint replacement surgery can speed recovery and prevent re-injury in the future due to weakened muscles. Contact your doctor or a physical therapist for more information on knee rehabilitation.    CONSTIPATION  Constipation is defined medically as fewer than three stools per week and severe constipation as less than one stool per week.  Even if you have a regular bowel pattern at home, your normal regimen is likely to be disrupted due to multiple reasons following surgery.  Combination of anesthesia, postoperative narcotics, change in appetite and fluid intake all can affect your bowels.   YOU MUST use at least one of the following options; they are listed in order of increasing strength to get the job done.  They are all available over the counter, and you may need to use some, POSSIBLY even all of these options:    Drink plenty of fluids (prune juice may be helpful) and high fiber foods Colace 100 mg by mouth twice a day  Senokot for constipation as directed and as needed Dulcolax (bisacodyl), take with full glass of water  Miralax (polyethylene glycol) once or twice a day as needed.  If you have tried all these things and are unable to have a bowel movement in the first 3-4 days after surgery call either your surgeon or your primary doctor.    If you experience loose stools or diarrhea, hold the medications until you stool forms back up.  If your symptoms do not get better within 1 week or if they get worse, check with your doctor.  If you experience "the worst abdominal pain ever" or develop nausea or vomiting, please contact the office immediately for further recommendations for treatment.   ITCHING:  If you experience itching with your medications, try taking only a single pain pill, or even half a pain pill at a time.  You can also use Benadryl over the counter for itching or also to help with sleep.    TED HOSE STOCKINGS:  Use stockings on both legs until for at least  2 weeks or as directed by physician office. They may be removed at night for sleeping.  MEDICATIONS:  See your medication summary on the "After Visit Summary" that nursing will review with you.  You may have some home medications which will be placed on hold until you complete the course of blood thinner medication.  It is important for you to complete the blood thinner medication as prescribed.  PRECAUTIONS:  If you experience chest pain or shortness of breath - call 911 immediately for transfer to the hospital emergency department.   If you develop a fever greater that 101 F, purulent drainage from wound, increased redness or drainage from wound, foul odor from the wound/dressing, or calf pain - CONTACT YOUR SURGEON.                                                   FOLLOW-UP APPOINTMENTS:  If you do not already have a post-op appointment, please call the office for an appointment to be seen by your surgeon.  Guidelines for how soon to be seen are listed in your "After Visit Summary", but are typically between 1-4 weeks after surgery.  OTHER INSTRUCTIONS:   Knee Replacement:  Do not place pillow under knee, focus on keeping the knee straight while resting. CPM instructions: 0-90 degrees, 2 hours in the morning, 2 hours in the afternoon, and 2 hours in the evening. Place foam block, curve side up under heel at all times except when in CPM or when walking.  DO NOT modify, tear, cut, or change the foam block in any way.  MAKE SURE YOU:  Understand these instructions.  Get help right away if you are not doing well or get worse.    Thank you for letting us be a part of your medical care team.  It is a privilege we respect greatly.  We hope these instructions will help you stay on track for a fast and full recovery!     Increase activity slowly as tolerated    Complete by:  As directed             Medication List    STOP  taking these medications        cephALEXin 500 MG capsule  Commonly known as:  KEFLEX     traMADol 50 MG tablet  Commonly known as:  ULTRAM      TAKE these medications        acetaminophen 500 MG tablet  Commonly known as:  TYLENOL  Take 2 tablets (1,000 mg total) by mouth every 8 (eight) hours as needed for mild pain or moderate pain.     allopurinol 300 MG tablet  Commonly known as:  ZYLOPRIM  TAKE ONE TABLET BY MOUTH ONE TIME DAILY     amLODipine 5 MG tablet  Commonly known as:  NORVASC  TAKE 1 TABLET (5 MG TOTAL) BY MOUTH DAILY.     aspirin EC 325 MG tablet  Take 1 tablet (325 mg total) by mouth 2 (two) times daily.     ferrous sulfate 325 (65 FE) MG tablet  Take 1 tablet (325 mg total) by mouth 3 (three) times daily after meals.     magnesium oxide 400 MG tablet  Commonly known as:  MAG-OX  Take 400 mg by mouth daily.     metFORMIN 500  MG tablet  Commonly known as:  GLUCOPHAGE  TAKE ONE TABLET BY MOUTH TWICE DAILY WITH MEALS     methocarbamol 500 MG tablet  Commonly known as:  ROBAXIN  Take 1 tablet (500 mg total) by mouth every 6 (six) hours as needed for muscle spasms.     multivitamin with minerals tablet  Take 1 tablet by mouth daily.     oxyCODONE 5 MG immediate release tablet  Commonly known as:  Oxy IR/ROXICODONE  Take 1-3 tablets (5-15 mg total) by mouth every 4 (four) hours as needed for moderate pain or severe pain.     oxymetazoline 0.05 % nasal spray  Commonly known as:  AFRIN  Place 1 spray into both nostrils at bedtime.     pantoprazole 40 MG tablet  Commonly known as:  PROTONIX  Take 1 tablet (40 mg total) by mouth daily. Take 30-60 min before dinner     polyethylene glycol packet  Commonly known as:  MIRALAX / GLYCOLAX  Take 17 g by mouth daily as needed for mild constipation.     Potassium Gluconate 595 MG Caps  Take 1 capsule by mouth daily.     simvastatin 20 MG tablet  Commonly known as:  ZOCOR  TAKE 1 TABLET (20 MG TOTAL) BY  MOUTH AT BEDTIME.     vitamin B-12 1000 MCG tablet  Commonly known as:  CYANOCOBALAMIN  Take 1,000 mcg by mouth daily.     Vitamin D 2000 UNITS Caps  Take 1 capsule by mouth daily.       Follow-up Information    Follow up with [facility].   Why:  home health physical therapy   Contact information:   [place] [address] [phone]       Follow up with Cynda Familia, MD. Schedule an appointment as soon as possible for a visit in 2 weeks.   Specialty:  Orthopedic Surgery   Contact information:   [address] [place] [address] [phone]       Signed: Lajean Manes 05/26/2015, 6:41 AM

## 2015-05-28 DIAGNOSIS — M109 Gout, unspecified: Secondary | ICD-10-CM | POA: Diagnosis not present

## 2015-05-28 DIAGNOSIS — D509 Iron deficiency anemia, unspecified: Secondary | ICD-10-CM | POA: Diagnosis not present

## 2015-05-28 DIAGNOSIS — Z7982 Long term (current) use of aspirin: Secondary | ICD-10-CM | POA: Diagnosis not present

## 2015-05-28 DIAGNOSIS — E669 Obesity, unspecified: Secondary | ICD-10-CM | POA: Diagnosis not present

## 2015-05-28 DIAGNOSIS — Z6839 Body mass index (BMI) 39.0-39.9, adult: Secondary | ICD-10-CM | POA: Diagnosis not present

## 2015-05-28 DIAGNOSIS — Z8546 Personal history of malignant neoplasm of prostate: Secondary | ICD-10-CM | POA: Diagnosis not present

## 2015-05-28 DIAGNOSIS — G4733 Obstructive sleep apnea (adult) (pediatric): Secondary | ICD-10-CM | POA: Diagnosis not present

## 2015-05-28 DIAGNOSIS — R739 Hyperglycemia, unspecified: Secondary | ICD-10-CM | POA: Diagnosis not present

## 2015-05-28 DIAGNOSIS — I10 Essential (primary) hypertension: Secondary | ICD-10-CM | POA: Diagnosis not present

## 2015-05-28 DIAGNOSIS — Z471 Aftercare following joint replacement surgery: Secondary | ICD-10-CM | POA: Diagnosis not present

## 2015-05-28 DIAGNOSIS — K219 Gastro-esophageal reflux disease without esophagitis: Secondary | ICD-10-CM | POA: Diagnosis not present

## 2015-05-28 DIAGNOSIS — Z96652 Presence of left artificial knee joint: Secondary | ICD-10-CM | POA: Diagnosis not present

## 2015-05-28 DIAGNOSIS — Z85048 Personal history of other malignant neoplasm of rectum, rectosigmoid junction, and anus: Secondary | ICD-10-CM | POA: Diagnosis not present

## 2015-05-31 DIAGNOSIS — Z85048 Personal history of other malignant neoplasm of rectum, rectosigmoid junction, and anus: Secondary | ICD-10-CM | POA: Diagnosis not present

## 2015-05-31 DIAGNOSIS — Z8546 Personal history of malignant neoplasm of prostate: Secondary | ICD-10-CM | POA: Diagnosis not present

## 2015-05-31 DIAGNOSIS — Z471 Aftercare following joint replacement surgery: Secondary | ICD-10-CM | POA: Diagnosis not present

## 2015-05-31 DIAGNOSIS — Z96652 Presence of left artificial knee joint: Secondary | ICD-10-CM | POA: Diagnosis not present

## 2015-05-31 DIAGNOSIS — Z6839 Body mass index (BMI) 39.0-39.9, adult: Secondary | ICD-10-CM | POA: Diagnosis not present

## 2015-05-31 DIAGNOSIS — Z7982 Long term (current) use of aspirin: Secondary | ICD-10-CM | POA: Diagnosis not present

## 2015-05-31 DIAGNOSIS — E669 Obesity, unspecified: Secondary | ICD-10-CM | POA: Diagnosis not present

## 2015-05-31 DIAGNOSIS — M109 Gout, unspecified: Secondary | ICD-10-CM | POA: Diagnosis not present

## 2015-05-31 DIAGNOSIS — G4733 Obstructive sleep apnea (adult) (pediatric): Secondary | ICD-10-CM | POA: Diagnosis not present

## 2015-05-31 DIAGNOSIS — I10 Essential (primary) hypertension: Secondary | ICD-10-CM | POA: Diagnosis not present

## 2015-05-31 DIAGNOSIS — K219 Gastro-esophageal reflux disease without esophagitis: Secondary | ICD-10-CM | POA: Diagnosis not present

## 2015-05-31 DIAGNOSIS — R739 Hyperglycemia, unspecified: Secondary | ICD-10-CM | POA: Diagnosis not present

## 2015-05-31 DIAGNOSIS — D509 Iron deficiency anemia, unspecified: Secondary | ICD-10-CM | POA: Diagnosis not present

## 2015-06-02 DIAGNOSIS — G4733 Obstructive sleep apnea (adult) (pediatric): Secondary | ICD-10-CM | POA: Diagnosis not present

## 2015-06-02 DIAGNOSIS — K219 Gastro-esophageal reflux disease without esophagitis: Secondary | ICD-10-CM | POA: Diagnosis not present

## 2015-06-02 DIAGNOSIS — Z96652 Presence of left artificial knee joint: Secondary | ICD-10-CM | POA: Diagnosis not present

## 2015-06-02 DIAGNOSIS — Z471 Aftercare following joint replacement surgery: Secondary | ICD-10-CM | POA: Diagnosis not present

## 2015-06-02 DIAGNOSIS — E669 Obesity, unspecified: Secondary | ICD-10-CM | POA: Diagnosis not present

## 2015-06-02 DIAGNOSIS — M109 Gout, unspecified: Secondary | ICD-10-CM | POA: Diagnosis not present

## 2015-06-02 DIAGNOSIS — Z7982 Long term (current) use of aspirin: Secondary | ICD-10-CM | POA: Diagnosis not present

## 2015-06-02 DIAGNOSIS — D509 Iron deficiency anemia, unspecified: Secondary | ICD-10-CM | POA: Diagnosis not present

## 2015-06-02 DIAGNOSIS — I10 Essential (primary) hypertension: Secondary | ICD-10-CM | POA: Diagnosis not present

## 2015-06-02 DIAGNOSIS — Z8546 Personal history of malignant neoplasm of prostate: Secondary | ICD-10-CM | POA: Diagnosis not present

## 2015-06-02 DIAGNOSIS — R739 Hyperglycemia, unspecified: Secondary | ICD-10-CM | POA: Diagnosis not present

## 2015-06-02 DIAGNOSIS — Z85048 Personal history of other malignant neoplasm of rectum, rectosigmoid junction, and anus: Secondary | ICD-10-CM | POA: Diagnosis not present

## 2015-06-02 DIAGNOSIS — Z6839 Body mass index (BMI) 39.0-39.9, adult: Secondary | ICD-10-CM | POA: Diagnosis not present

## 2015-06-05 DIAGNOSIS — I10 Essential (primary) hypertension: Secondary | ICD-10-CM | POA: Diagnosis not present

## 2015-06-05 DIAGNOSIS — Z6839 Body mass index (BMI) 39.0-39.9, adult: Secondary | ICD-10-CM | POA: Diagnosis not present

## 2015-06-05 DIAGNOSIS — Z471 Aftercare following joint replacement surgery: Secondary | ICD-10-CM | POA: Diagnosis not present

## 2015-06-05 DIAGNOSIS — R739 Hyperglycemia, unspecified: Secondary | ICD-10-CM | POA: Diagnosis not present

## 2015-06-05 DIAGNOSIS — Z85048 Personal history of other malignant neoplasm of rectum, rectosigmoid junction, and anus: Secondary | ICD-10-CM | POA: Diagnosis not present

## 2015-06-05 DIAGNOSIS — Z96652 Presence of left artificial knee joint: Secondary | ICD-10-CM | POA: Diagnosis not present

## 2015-06-05 DIAGNOSIS — M109 Gout, unspecified: Secondary | ICD-10-CM | POA: Diagnosis not present

## 2015-06-05 DIAGNOSIS — E669 Obesity, unspecified: Secondary | ICD-10-CM | POA: Diagnosis not present

## 2015-06-05 DIAGNOSIS — Z7982 Long term (current) use of aspirin: Secondary | ICD-10-CM | POA: Diagnosis not present

## 2015-06-05 DIAGNOSIS — D509 Iron deficiency anemia, unspecified: Secondary | ICD-10-CM | POA: Diagnosis not present

## 2015-06-05 DIAGNOSIS — K219 Gastro-esophageal reflux disease without esophagitis: Secondary | ICD-10-CM | POA: Diagnosis not present

## 2015-06-05 DIAGNOSIS — G4733 Obstructive sleep apnea (adult) (pediatric): Secondary | ICD-10-CM | POA: Diagnosis not present

## 2015-06-05 DIAGNOSIS — Z8546 Personal history of malignant neoplasm of prostate: Secondary | ICD-10-CM | POA: Diagnosis not present

## 2015-06-06 DIAGNOSIS — Z471 Aftercare following joint replacement surgery: Secondary | ICD-10-CM | POA: Diagnosis not present

## 2015-06-06 DIAGNOSIS — Z96652 Presence of left artificial knee joint: Secondary | ICD-10-CM | POA: Diagnosis not present

## 2015-06-09 DIAGNOSIS — Z7982 Long term (current) use of aspirin: Secondary | ICD-10-CM | POA: Diagnosis not present

## 2015-06-09 DIAGNOSIS — G4733 Obstructive sleep apnea (adult) (pediatric): Secondary | ICD-10-CM | POA: Diagnosis not present

## 2015-06-09 DIAGNOSIS — Z6839 Body mass index (BMI) 39.0-39.9, adult: Secondary | ICD-10-CM | POA: Diagnosis not present

## 2015-06-09 DIAGNOSIS — Z8546 Personal history of malignant neoplasm of prostate: Secondary | ICD-10-CM | POA: Diagnosis not present

## 2015-06-09 DIAGNOSIS — R739 Hyperglycemia, unspecified: Secondary | ICD-10-CM | POA: Diagnosis not present

## 2015-06-09 DIAGNOSIS — Z96652 Presence of left artificial knee joint: Secondary | ICD-10-CM | POA: Diagnosis not present

## 2015-06-09 DIAGNOSIS — M109 Gout, unspecified: Secondary | ICD-10-CM | POA: Diagnosis not present

## 2015-06-09 DIAGNOSIS — E669 Obesity, unspecified: Secondary | ICD-10-CM | POA: Diagnosis not present

## 2015-06-09 DIAGNOSIS — D509 Iron deficiency anemia, unspecified: Secondary | ICD-10-CM | POA: Diagnosis not present

## 2015-06-09 DIAGNOSIS — I10 Essential (primary) hypertension: Secondary | ICD-10-CM | POA: Diagnosis not present

## 2015-06-09 DIAGNOSIS — Z471 Aftercare following joint replacement surgery: Secondary | ICD-10-CM | POA: Diagnosis not present

## 2015-06-09 DIAGNOSIS — K219 Gastro-esophageal reflux disease without esophagitis: Secondary | ICD-10-CM | POA: Diagnosis not present

## 2015-06-09 DIAGNOSIS — Z85048 Personal history of other malignant neoplasm of rectum, rectosigmoid junction, and anus: Secondary | ICD-10-CM | POA: Diagnosis not present

## 2015-06-12 DIAGNOSIS — I10 Essential (primary) hypertension: Secondary | ICD-10-CM | POA: Diagnosis not present

## 2015-06-12 DIAGNOSIS — Z471 Aftercare following joint replacement surgery: Secondary | ICD-10-CM | POA: Diagnosis not present

## 2015-06-12 DIAGNOSIS — Z96652 Presence of left artificial knee joint: Secondary | ICD-10-CM | POA: Diagnosis not present

## 2015-06-12 DIAGNOSIS — D509 Iron deficiency anemia, unspecified: Secondary | ICD-10-CM | POA: Diagnosis not present

## 2015-06-12 DIAGNOSIS — Z7982 Long term (current) use of aspirin: Secondary | ICD-10-CM | POA: Diagnosis not present

## 2015-06-12 DIAGNOSIS — E669 Obesity, unspecified: Secondary | ICD-10-CM | POA: Diagnosis not present

## 2015-06-12 DIAGNOSIS — Z8546 Personal history of malignant neoplasm of prostate: Secondary | ICD-10-CM | POA: Diagnosis not present

## 2015-06-12 DIAGNOSIS — K219 Gastro-esophageal reflux disease without esophagitis: Secondary | ICD-10-CM | POA: Diagnosis not present

## 2015-06-12 DIAGNOSIS — Z6839 Body mass index (BMI) 39.0-39.9, adult: Secondary | ICD-10-CM | POA: Diagnosis not present

## 2015-06-12 DIAGNOSIS — Z85048 Personal history of other malignant neoplasm of rectum, rectosigmoid junction, and anus: Secondary | ICD-10-CM | POA: Diagnosis not present

## 2015-06-12 DIAGNOSIS — M109 Gout, unspecified: Secondary | ICD-10-CM | POA: Diagnosis not present

## 2015-06-12 DIAGNOSIS — G4733 Obstructive sleep apnea (adult) (pediatric): Secondary | ICD-10-CM | POA: Diagnosis not present

## 2015-06-12 DIAGNOSIS — R739 Hyperglycemia, unspecified: Secondary | ICD-10-CM | POA: Diagnosis not present

## 2015-06-16 DIAGNOSIS — Z6839 Body mass index (BMI) 39.0-39.9, adult: Secondary | ICD-10-CM | POA: Diagnosis not present

## 2015-06-16 DIAGNOSIS — R739 Hyperglycemia, unspecified: Secondary | ICD-10-CM | POA: Diagnosis not present

## 2015-06-16 DIAGNOSIS — Z85048 Personal history of other malignant neoplasm of rectum, rectosigmoid junction, and anus: Secondary | ICD-10-CM | POA: Diagnosis not present

## 2015-06-16 DIAGNOSIS — Z471 Aftercare following joint replacement surgery: Secondary | ICD-10-CM | POA: Diagnosis not present

## 2015-06-16 DIAGNOSIS — E669 Obesity, unspecified: Secondary | ICD-10-CM | POA: Diagnosis not present

## 2015-06-16 DIAGNOSIS — Z96652 Presence of left artificial knee joint: Secondary | ICD-10-CM | POA: Diagnosis not present

## 2015-06-16 DIAGNOSIS — I10 Essential (primary) hypertension: Secondary | ICD-10-CM | POA: Diagnosis not present

## 2015-06-16 DIAGNOSIS — Z7982 Long term (current) use of aspirin: Secondary | ICD-10-CM | POA: Diagnosis not present

## 2015-06-16 DIAGNOSIS — M109 Gout, unspecified: Secondary | ICD-10-CM | POA: Diagnosis not present

## 2015-06-16 DIAGNOSIS — Z8546 Personal history of malignant neoplasm of prostate: Secondary | ICD-10-CM | POA: Diagnosis not present

## 2015-06-16 DIAGNOSIS — G4733 Obstructive sleep apnea (adult) (pediatric): Secondary | ICD-10-CM | POA: Diagnosis not present

## 2015-06-16 DIAGNOSIS — D509 Iron deficiency anemia, unspecified: Secondary | ICD-10-CM | POA: Diagnosis not present

## 2015-06-16 DIAGNOSIS — K219 Gastro-esophageal reflux disease without esophagitis: Secondary | ICD-10-CM | POA: Diagnosis not present

## 2015-06-19 DIAGNOSIS — Z8546 Personal history of malignant neoplasm of prostate: Secondary | ICD-10-CM | POA: Diagnosis not present

## 2015-06-19 DIAGNOSIS — E669 Obesity, unspecified: Secondary | ICD-10-CM | POA: Diagnosis not present

## 2015-06-19 DIAGNOSIS — I10 Essential (primary) hypertension: Secondary | ICD-10-CM | POA: Diagnosis not present

## 2015-06-19 DIAGNOSIS — D509 Iron deficiency anemia, unspecified: Secondary | ICD-10-CM | POA: Diagnosis not present

## 2015-06-19 DIAGNOSIS — Z85048 Personal history of other malignant neoplasm of rectum, rectosigmoid junction, and anus: Secondary | ICD-10-CM | POA: Diagnosis not present

## 2015-06-19 DIAGNOSIS — Z7982 Long term (current) use of aspirin: Secondary | ICD-10-CM | POA: Diagnosis not present

## 2015-06-19 DIAGNOSIS — Z471 Aftercare following joint replacement surgery: Secondary | ICD-10-CM | POA: Diagnosis not present

## 2015-06-19 DIAGNOSIS — Z6839 Body mass index (BMI) 39.0-39.9, adult: Secondary | ICD-10-CM | POA: Diagnosis not present

## 2015-06-19 DIAGNOSIS — Z96652 Presence of left artificial knee joint: Secondary | ICD-10-CM | POA: Diagnosis not present

## 2015-06-19 DIAGNOSIS — G4733 Obstructive sleep apnea (adult) (pediatric): Secondary | ICD-10-CM | POA: Diagnosis not present

## 2015-06-19 DIAGNOSIS — R739 Hyperglycemia, unspecified: Secondary | ICD-10-CM | POA: Diagnosis not present

## 2015-06-19 DIAGNOSIS — K219 Gastro-esophageal reflux disease without esophagitis: Secondary | ICD-10-CM | POA: Diagnosis not present

## 2015-06-19 DIAGNOSIS — M109 Gout, unspecified: Secondary | ICD-10-CM | POA: Diagnosis not present

## 2015-06-23 ENCOUNTER — Ambulatory Visit (INDEPENDENT_AMBULATORY_CARE_PROVIDER_SITE_OTHER): Payer: Medicare Other | Admitting: Family Medicine

## 2015-06-23 ENCOUNTER — Encounter: Payer: Self-pay | Admitting: Family Medicine

## 2015-06-23 VITALS — BP 130/80 | HR 78 | Temp 97.7°F | Wt 253.5 lb

## 2015-06-23 DIAGNOSIS — R739 Hyperglycemia, unspecified: Secondary | ICD-10-CM

## 2015-06-23 DIAGNOSIS — R61 Generalized hyperhidrosis: Secondary | ICD-10-CM | POA: Diagnosis not present

## 2015-06-23 DIAGNOSIS — G4700 Insomnia, unspecified: Secondary | ICD-10-CM

## 2015-06-23 DIAGNOSIS — IMO0001 Reserved for inherently not codable concepts without codable children: Secondary | ICD-10-CM

## 2015-06-23 LAB — CBC WITH DIFFERENTIAL/PLATELET
Basophils Absolute: 0 10*3/uL (ref 0.0–0.1)
Basophils Relative: 0.4 % (ref 0.0–3.0)
Eosinophils Absolute: 0.1 10*3/uL (ref 0.0–0.7)
Eosinophils Relative: 2.1 % (ref 0.0–5.0)
HCT: 38.3 % — ABNORMAL LOW (ref 39.0–52.0)
Hemoglobin: 12.3 g/dL — ABNORMAL LOW (ref 13.0–17.0)
Lymphocytes Relative: 23.6 % (ref 12.0–46.0)
Lymphs Abs: 1.4 10*3/uL (ref 0.7–4.0)
MCHC: 32.1 g/dL (ref 30.0–36.0)
MCV: 85.9 fl (ref 78.0–100.0)
Monocytes Absolute: 0.6 10*3/uL (ref 0.1–1.0)
Monocytes Relative: 9.6 % (ref 3.0–12.0)
Neutro Abs: 3.7 10*3/uL (ref 1.4–7.7)
Neutrophils Relative %: 64.3 % (ref 43.0–77.0)
Platelets: 277 10*3/uL (ref 150.0–400.0)
RBC: 4.46 Mil/uL (ref 4.22–5.81)
RDW: 16.9 % — ABNORMAL HIGH (ref 11.5–15.5)
WBC: 5.8 10*3/uL (ref 4.0–10.5)

## 2015-06-23 LAB — HEMOGLOBIN A1C: Hgb A1c MFr Bld: 5.5 % (ref 4.6–6.5)

## 2015-06-23 LAB — GLUCOSE, POCT (MANUAL RESULT ENTRY): POC Glucose: 108 mg/dl — AB (ref 70–99)

## 2015-06-23 MED ORDER — TEMAZEPAM 30 MG PO CAPS: 30.0000 mg | ORAL_CAPSULE | Freq: Every evening | ORAL | Status: DC | PRN

## 2015-06-23 NOTE — Progress Notes (Signed)
   Subjective:    Patient ID: Marcus [name], male    DOB: 03-Mar-1941, 74 y.o.   MRN: [id number]  HPI Here for several things. First he has been sweating a lot for the past 4 weeks. No fevers. He had a knee replacement surgery 4 weeks ago and apparently the surgery went well, though he still has pain in the knee. He takes hydrocodone TID for this. He also has trouble sleeping at night. His surgeon, Dr. Theda Sers, has been giving him Zolpidem but this is not helping. He wonders if his "diabetes" is out of control. From the chart I see no evidence that e has ever had even borderline diabetes, and all his A1c readings for years have been normal. This last one on 06-19-14 was 6.0. He was started on Metformin one year ago.    Review of Systems  Constitutional: Negative.   Respiratory: Negative.   Cardiovascular: Negative.   Endocrine: Negative.   Musculoskeletal: Positive for arthralgias.       Objective:   Physical Exam  Constitutional: He is oriented to person, place, and time. He appears well-developed and well-nourished.  Walks with a cane   Neck: No thyromegaly present.  Cardiovascular: Normal rate, regular rhythm, normal heart sounds and intact distal pulses.   Pulmonary/Chest: Effort normal and breath sounds normal.  Lymphadenopathy:    He has no cervical adenopathy.  Neurological: He is alert and oriented to person, place, and time.  Psychiatric: He has a normal mood and affect. His behavior is normal. Thought content normal.          Assessment & Plan:  He is recovering from knee replacement surgery and he is sweating a lot. He has not had fevers but we want to rule out the possibility of infection, so he will have a CBC drawn today. He may be experiencing hypoglycemic epidoses, so we will check another A1c today. He will stop taking Metformin. Try Temazepam for sleep instead of Zolpidem.

## 2015-06-23 NOTE — Progress Notes (Signed)
Pre visit review using our clinic review tool, if applicable. No additional management support is needed unless otherwise documented below in the visit note. 

## 2015-06-24 DIAGNOSIS — Z6839 Body mass index (BMI) 39.0-39.9, adult: Secondary | ICD-10-CM | POA: Diagnosis not present

## 2015-06-24 DIAGNOSIS — E669 Obesity, unspecified: Secondary | ICD-10-CM | POA: Diagnosis not present

## 2015-06-24 DIAGNOSIS — D509 Iron deficiency anemia, unspecified: Secondary | ICD-10-CM | POA: Diagnosis not present

## 2015-06-24 DIAGNOSIS — Z85048 Personal history of other malignant neoplasm of rectum, rectosigmoid junction, and anus: Secondary | ICD-10-CM | POA: Diagnosis not present

## 2015-06-24 DIAGNOSIS — K219 Gastro-esophageal reflux disease without esophagitis: Secondary | ICD-10-CM | POA: Diagnosis not present

## 2015-06-24 DIAGNOSIS — Z8546 Personal history of malignant neoplasm of prostate: Secondary | ICD-10-CM | POA: Diagnosis not present

## 2015-06-24 DIAGNOSIS — M109 Gout, unspecified: Secondary | ICD-10-CM | POA: Diagnosis not present

## 2015-06-24 DIAGNOSIS — I10 Essential (primary) hypertension: Secondary | ICD-10-CM | POA: Diagnosis not present

## 2015-06-24 DIAGNOSIS — Z7982 Long term (current) use of aspirin: Secondary | ICD-10-CM | POA: Diagnosis not present

## 2015-06-24 DIAGNOSIS — Z471 Aftercare following joint replacement surgery: Secondary | ICD-10-CM | POA: Diagnosis not present

## 2015-06-24 DIAGNOSIS — R739 Hyperglycemia, unspecified: Secondary | ICD-10-CM | POA: Diagnosis not present

## 2015-06-24 DIAGNOSIS — G4733 Obstructive sleep apnea (adult) (pediatric): Secondary | ICD-10-CM | POA: Diagnosis not present

## 2015-06-24 DIAGNOSIS — Z96652 Presence of left artificial knee joint: Secondary | ICD-10-CM | POA: Diagnosis not present

## 2015-06-26 DIAGNOSIS — Z471 Aftercare following joint replacement surgery: Secondary | ICD-10-CM | POA: Diagnosis not present

## 2015-06-26 DIAGNOSIS — K219 Gastro-esophageal reflux disease without esophagitis: Secondary | ICD-10-CM | POA: Diagnosis not present

## 2015-06-26 DIAGNOSIS — Z85048 Personal history of other malignant neoplasm of rectum, rectosigmoid junction, and anus: Secondary | ICD-10-CM | POA: Diagnosis not present

## 2015-06-26 DIAGNOSIS — E669 Obesity, unspecified: Secondary | ICD-10-CM | POA: Diagnosis not present

## 2015-06-26 DIAGNOSIS — I10 Essential (primary) hypertension: Secondary | ICD-10-CM | POA: Diagnosis not present

## 2015-06-26 DIAGNOSIS — R739 Hyperglycemia, unspecified: Secondary | ICD-10-CM | POA: Diagnosis not present

## 2015-06-26 DIAGNOSIS — M109 Gout, unspecified: Secondary | ICD-10-CM | POA: Diagnosis not present

## 2015-06-26 DIAGNOSIS — Z6839 Body mass index (BMI) 39.0-39.9, adult: Secondary | ICD-10-CM | POA: Diagnosis not present

## 2015-06-26 DIAGNOSIS — D509 Iron deficiency anemia, unspecified: Secondary | ICD-10-CM | POA: Diagnosis not present

## 2015-06-26 DIAGNOSIS — Z96652 Presence of left artificial knee joint: Secondary | ICD-10-CM | POA: Diagnosis not present

## 2015-06-26 DIAGNOSIS — Z8546 Personal history of malignant neoplasm of prostate: Secondary | ICD-10-CM | POA: Diagnosis not present

## 2015-06-26 DIAGNOSIS — Z7982 Long term (current) use of aspirin: Secondary | ICD-10-CM | POA: Diagnosis not present

## 2015-06-26 DIAGNOSIS — G4733 Obstructive sleep apnea (adult) (pediatric): Secondary | ICD-10-CM | POA: Diagnosis not present

## 2015-06-28 ENCOUNTER — Other Ambulatory Visit: Payer: Self-pay | Admitting: Gastroenterology

## 2015-07-01 ENCOUNTER — Other Ambulatory Visit: Payer: Self-pay | Admitting: *Deleted

## 2015-07-01 DIAGNOSIS — R112 Nausea with vomiting, unspecified: Secondary | ICD-10-CM

## 2015-07-01 MED ORDER — PANTOPRAZOLE SODIUM 40 MG PO TBEC: 40.0000 mg | DELAYED_RELEASE_TABLET | Freq: Every day | ORAL | Status: DC

## 2015-07-18 DIAGNOSIS — Z4789 Encounter for other orthopedic aftercare: Secondary | ICD-10-CM | POA: Diagnosis not present

## 2015-07-18 DIAGNOSIS — M1711 Unilateral primary osteoarthritis, right knee: Secondary | ICD-10-CM | POA: Diagnosis not present

## 2015-07-22 ENCOUNTER — Other Ambulatory Visit: Payer: Self-pay | Admitting: Family Medicine

## 2015-07-26 ENCOUNTER — Other Ambulatory Visit: Payer: Self-pay | Admitting: Internal Medicine

## 2015-08-07 ENCOUNTER — Encounter: Payer: Self-pay | Admitting: Adult Health

## 2015-08-07 ENCOUNTER — Ambulatory Visit (INDEPENDENT_AMBULATORY_CARE_PROVIDER_SITE_OTHER): Payer: Medicare Other | Admitting: Adult Health

## 2015-08-07 VITALS — BP 128/80 | Temp 98.1°F | Ht 69.0 in | Wt 252.6 lb

## 2015-08-07 DIAGNOSIS — Z7189 Other specified counseling: Secondary | ICD-10-CM

## 2015-08-07 DIAGNOSIS — Z7689 Persons encountering health services in other specified circumstances: Secondary | ICD-10-CM

## 2015-08-07 DIAGNOSIS — I10 Essential (primary) hypertension: Secondary | ICD-10-CM

## 2015-08-07 MED ORDER — TEMAZEPAM 30 MG PO CAPS: 30.0000 mg | ORAL_CAPSULE | Freq: Every evening | ORAL | Status: DC | PRN

## 2015-08-07 NOTE — Progress Notes (Signed)
Pre visit review using our clinic review tool, if applicable. No additional management support is needed unless otherwise documented below in the visit note. 

## 2015-08-07 NOTE — Patient Instructions (Signed)
It was great seeing you again!   I will see you at your physical. If you need anything in the meantime, please let me know.

## 2015-08-07 NOTE — Progress Notes (Signed)
HPI:  Marcus [name] is here to establish care. He is a very pleasant caucasian male who  has a past medical history of Unspecified vitamin D deficiency; Calcaneal spur; Ventral hernia; Hyperlipidemia; Diverticulosis of colon (without mention of hemorrhage); Elevated prostate specific antigen (PSA); Gout, unspecified; Unspecified essential hypertension; History of colon cancer (1988); Personal history of colonic polyps (08/21/2009); Lumbar spondylolysis; Vitamin B12 deficiency; Postgastric surgery syndromes; Coronary artery disease; Normal nuclear stress test (04-23-2010); Echocardiogram findings abnormal, without diagnosis (04-23-2010); Edema of lower extremity; Hiatal hernia; External hemorrhoids; Esophageal reflux; Arthritis; Acute meniscal tear of knee (left); Frequency of urination; Paraesophageal hernia; OSA (obstructive sleep apnea); Pneumonia; Prostate cancer ([place]) (2016); and Gout. He presents with his wife ( who is also establishing care) to this visit.   Last PCP and physical: December 2015 Immunizations: Needs shingles - going to check with insurance about shingles  Diet: Is eating healthier - losing weight Exercise: Has not been exercising s/p left total knee surgery Colonoscopy:2014 - 5 years  Is followed by:  Urologist - Dr. Diona Fanti  Orthopedics - Dr. Theda Sers [facility] - [place]  Has the following chronic problems that need to be addressed today:  Hypertension  - he feels as though this is well controlled on his current medication of Norvasc 5 mg. He has not had issus with headaches, blurred vision, dizziness or lightheadedness.   Left Total Knee Arthroplasty - He recently had a total left knee and was discharged on 05/23/2015. He endorses that the surgery went well. He has minimal pain currently. Feels as though his knee is not strong enough and sometimes feels as though it " gives out". He continues to walk with a cain.   ROS negative for unless reported above: fevers,  chills,feeling poorly, unintentional weight loss, hearing or vision loss, chest pain, palpitations, leg claudication, struggling to breath,Not feeling congested in the chest, no orthopenia, no cough,no wheezing, normal appetite, no soft tissue swelling, no hemoptysis, melena, hematochezia, hematuria, falls, loc, si, or thoughts of self harm.   Past Medical History  Diagnosis Date  . Unspecified vitamin D deficiency   . Calcaneal spur   . Ventral hernia   . Hyperlipidemia   . Diverticulosis of colon (without mention of hemorrhage)   . Elevated prostate specific antigen (PSA)   . Gout, unspecified   . Unspecified essential hypertension   . History of colon cancer   . Personal history of colonic polyps 08/21/2009    TUBULAR ADENOMAS  . Lumbar spondylolysis   . Vitamin B12 deficiency   . Postgastric surgery syndromes   . Coronary artery disease     dr Stanford Breed  . Normal nuclear stress test 04-23-2010    ef 51%,  septal hypokinesis, normal perfusion  . Echocardiogram findings abnormal, without diagnosis 04-23-2010    normal LV function, mod. left atrial enlargement, mild right artrial enlargement and grade 1 diastolic dysfunction  . Edema of lower extremity     ankles, elevates legs  . Hiatal hernia   . External hemorrhoids   . Esophageal reflux   . Arthritis     generalized  . Acute meniscal tear of knee left  . Frequency of urination     followed by dr dalhstedt  . Diabetes mellitus   . Paraesophageal hernia     large  . OSA (obstructive sleep apnea)     does not use CPAP   . Pneumonia     history of; last episode 2015  . Prostate cancer ([place]) 2016  Past Surgical History  Procedure Laterality Date  . Vertical banded gastroplasty  1986  . Knee surgery      left  . Shoulder arthroscopy  05-23-2007    RIGHT  . Laminectomy and microdiscectomy lumbar spine  MARCH  2008    L3 -  4  . Knee arthroscopy  2007    RIGHT  . Sigmoid colectomy for cancer  1989  . Cataract  extraction w/ intraocular lens  implant, bilateral    . Laparoscopic incisional / umbilical / ventral hernia repair  2006  . Knee arthroscopy  07/13/2011    Procedure: ARTHROSCOPY KNEE;  Surgeon: Cynda Familia;  Location: [facility];  Service: Orthopedics;  Laterality: Left;  partial menisectomy with chondrylplasty  . Colonoscopy    . Esophagogastroduodenoscopy    . Cardiac catheterization    . Total knee arthroplasty Left 05/23/2015    Procedure: LEFT TOTAL KNEE ARTHROPLASTY;  Surgeon: Sydnee Cabal, MD;  Location: [facility];  Service: Orthopedics;  Laterality: Left;    Family History  Problem Relation Age of Onset  . Stroke Father   . Esophagitis Father     died from perforated esophagus  . Breast cancer Mother   . Heart disease Paternal Grandfather   . Colon cancer Maternal Grandmother     questionable    Social History   Social History  . Marital Status: Married    Spouse Name: N/A  . Number of Children: N/A  . Years of Education: N/A   Occupational History  . semi retired    Social History Main Topics  . Smoking status: Former Smoker -- 2.00 packs/day for 20 years    Types: Cigarettes    Quit date: 07/05/1974  . Smokeless tobacco: Never Used  . Alcohol Use: 0.0 oz/week    0 Standard drinks or equivalent per week     Comment: socially; also 1-2 glasses of wine or beer every other day   . Drug Use: No  . Sexual Activity: Not Asked   Other Topics Concern  . 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 Nelva Bush     Current outpatient prescriptions:  .  allopurinol (ZYLOPRIM) 300 MG tablet, TAKE ONE TABLET BY MOUTH ONE TIME DAILY, Disp: 90 tablet, Rfl: 1 .  amLODipine (NORVASC) 5 MG tablet, TAKE 1 TABLET (5 MG TOTAL) BY MOUTH DAILY., Disp: 90 tablet, Rfl: 1 .  Cholecalciferol  (VITAMIN D) 2000 UNITS CAPS, Take 1 capsule by mouth daily., Disp: , Rfl:  .  magnesium oxide (MAG-OX) 400 MG tablet, Take 400 mg by mouth daily., Disp: , Rfl:  .  Multiple Vitamins-Minerals (MULTIVITAMIN WITH MINERALS) tablet, Take 1 tablet by mouth daily., Disp: , Rfl:  .  pantoprazole (PROTONIX) 40 MG tablet, Take 1 tablet (40 mg total) by mouth daily. Take 30-60 min before dinner, Disp: 30 tablet, Rfl: 2 .  Potassium Gluconate 595 MG CAPS, Take 1 capsule by mouth daily., Disp: , Rfl:  .  simvastatin (ZOCOR) 20 MG tablet, TAKE 1 TABLET (20 MG TOTAL) BY MOUTH AT BEDTIME., Disp: 90 tablet, Rfl: 0 .  temazepam (RESTORIL) 30 MG capsule, Take 1 capsule (30 mg total) by mouth at bedtime as needed for sleep., Disp: 30 capsule,  Rfl: 0 .  vitamin B-12 (CYANOCOBALAMIN) 1000 MCG tablet, Take 1,000 mcg by mouth daily., Disp: , Rfl:  .  HYDROcodone-acetaminophen (NORCO/VICODIN) 5-325 MG tablet, TAKE 1 TO 2 TABLETS BY MOUTH EVERY 12 HOURS AS NEEDED FOR PAIN, Disp: , Rfl: 0 .  methocarbamol (ROBAXIN) 500 MG tablet, TAKE 1 TABLET BY MOUTH EVERY 6 HOURS AS NEEDED FOR MUSCLES SPASMS, Disp: , Rfl: 2  EXAM:  Filed Vitals:   08/07/15 1301  BP: 128/80  Temp: 98.1 F (36.7 C)    Body mass index is 37.29 kg/(m^2).  GENERAL: vitals reviewed and listed above, alert, oriented, appears well hydrated and in no acute distress. Obese  HEENT: atraumatic, conjunttiva clear, no obvious abnormalities on inspection of external nose and ears  NECK: Neck is soft and supple without masses, no adenopathy or thyromegaly, trachea midline, no JVD. Normal range of motion.   LUNGS: clear to auscultation bilaterally, no wheezes, rales or rhonchi, good air movement  CV: Regular rate and rhythm, normal S1/S2, no audible murmurs, gallops, or rubs. No carotid bruit and no peripheral edema.   MS: moves all extremities without noticeable abnormality. No edema noted  Abd: soft/nontender/nondistended/normal bowel sounds   Skin:  warm and dry, no rash. Scar on left knee from previous surgery   Extremities: No clubbing, cyanosis, or edema. Capillary refill is WNL. Pulses intact bilaterally in upper and lower extremities.   Neuro: CN II-XII intact, sensation and reflexes normal throughout, 5/5 muscle strength in bilateral upper and lower extremities. Normal finger to nose.    PSYCH: pleasant and cooperative, no obvious depression or anxiety  ASSESSMENT AND PLAN:  1. Essential hypertension - Controlled on current medication therapy. BP: 128/80 mmHg No change at this time   2. Encounter to establish care - Follow up for CPE or sooner with any acute issue - Continue to work on diet and strength training. He has lost about 10 pounds since November.   -We reviewed the PMH, PSH, FH, SH, Meds and Allergies. -We provided refills for any medications we will prescribe as needed. -We addressed current concerns per orders and patient instructions. -We have asked for records for pertinent exams, studies, vaccines and notes from previous providers. -We have advised patient to follow up per instructions below.   -Patient advised to return or notify a provider immediately if symptoms worsen or persist or new concerns arise.   Dorothyann Peng, AGNP

## 2015-08-11 DIAGNOSIS — Z96652 Presence of left artificial knee joint: Secondary | ICD-10-CM | POA: Diagnosis not present

## 2015-08-11 DIAGNOSIS — M238X1 Other internal derangements of right knee: Secondary | ICD-10-CM | POA: Diagnosis not present

## 2015-08-11 DIAGNOSIS — Z471 Aftercare following joint replacement surgery: Secondary | ICD-10-CM | POA: Diagnosis not present

## 2015-09-19 DIAGNOSIS — C61 Malignant neoplasm of prostate: Secondary | ICD-10-CM | POA: Diagnosis not present

## 2015-09-26 ENCOUNTER — Other Ambulatory Visit: Payer: Self-pay | Admitting: Family Medicine

## 2015-09-29 ENCOUNTER — Other Ambulatory Visit: Payer: Self-pay | Admitting: Urology

## 2015-09-29 DIAGNOSIS — C61 Malignant neoplasm of prostate: Secondary | ICD-10-CM

## 2015-09-29 DIAGNOSIS — Z0000 Encounter for general adult medical examination without abnormal findings: Secondary | ICD-10-CM | POA: Diagnosis not present

## 2015-09-30 ENCOUNTER — Other Ambulatory Visit: Payer: Self-pay | Admitting: Gastroenterology

## 2015-09-30 ENCOUNTER — Other Ambulatory Visit: Payer: Self-pay | Admitting: Family Medicine

## 2015-10-10 ENCOUNTER — Other Ambulatory Visit (INDEPENDENT_AMBULATORY_CARE_PROVIDER_SITE_OTHER): Payer: Medicare Other

## 2015-10-10 DIAGNOSIS — Z0000 Encounter for general adult medical examination without abnormal findings: Secondary | ICD-10-CM | POA: Diagnosis not present

## 2015-10-10 LAB — CBC WITH DIFFERENTIAL/PLATELET
Basophils Absolute: 0 10*3/uL (ref 0.0–0.1)
Basophils Relative: 0.4 % (ref 0.0–3.0)
Eosinophils Absolute: 0.2 10*3/uL (ref 0.0–0.7)
Eosinophils Relative: 2.8 % (ref 0.0–5.0)
HCT: 39.5 % (ref 39.0–52.0)
Hemoglobin: 13.2 g/dL (ref 13.0–17.0)
Lymphocytes Relative: 26.9 % (ref 12.0–46.0)
Lymphs Abs: 1.6 10*3/uL (ref 0.7–4.0)
MCHC: 33.3 g/dL (ref 30.0–36.0)
MCV: 82.1 fl (ref 78.0–100.0)
Monocytes Absolute: 0.5 10*3/uL (ref 0.1–1.0)
Monocytes Relative: 8.5 % (ref 3.0–12.0)
Neutro Abs: 3.5 10*3/uL (ref 1.4–7.7)
Neutrophils Relative %: 61.4 % (ref 43.0–77.0)
Platelets: 219 10*3/uL (ref 150.0–400.0)
RBC: 4.81 Mil/uL (ref 4.22–5.81)
RDW: 16.8 % — ABNORMAL HIGH (ref 11.5–15.5)
WBC: 5.8 10*3/uL (ref 4.0–10.5)

## 2015-10-10 LAB — LIPID PANEL
Cholesterol: 132 mg/dL (ref 0–200)
HDL: 46.9 mg/dL (ref >39.00)
LDL Cholesterol: 69 mg/dL (ref 0–99)
NonHDL: 84.6
Total CHOL/HDL Ratio: 3
Triglycerides: 80 mg/dL (ref 0.0–149.0)
VLDL: 16 mg/dL (ref 0.0–40.0)

## 2015-10-10 LAB — POC URINALSYSI DIPSTICK (AUTOMATED)
Bilirubin, UA: NEGATIVE
Glucose, UA: NEGATIVE
Ketones, UA: NEGATIVE
Leukocytes, UA: NEGATIVE
Nitrite, UA: NEGATIVE
Protein, UA: NEGATIVE
Spec Grav, UA: 1.02
Urobilinogen, UA: 0.2
pH, UA: 5.5

## 2015-10-10 LAB — PSA: PSA: 5.41 ng/mL — ABNORMAL HIGH (ref 0.10–4.00)

## 2015-10-10 LAB — HEPATIC FUNCTION PANEL
ALT: 13 U/L (ref 0–53)
AST: 15 U/L (ref 0–37)
Albumin: 3.8 g/dL (ref 3.5–5.2)
Alkaline Phosphatase: 63 U/L (ref 39–117)
Bilirubin, Direct: 0.1 mg/dL (ref 0.0–0.3)
Total Bilirubin: 0.5 mg/dL (ref 0.2–1.2)
Total Protein: 6.4 g/dL (ref 6.0–8.3)

## 2015-10-10 LAB — TSH: TSH: 0.52 u[IU]/mL (ref 0.35–4.50)

## 2015-10-10 LAB — BASIC METABOLIC PANEL
BUN: 20 mg/dL (ref 6–23)
CO2: 29 mEq/L (ref 19–32)
Calcium: 9.2 mg/dL (ref 8.4–10.5)
Chloride: 105 mEq/L (ref 96–112)
Creatinine, Ser: 1 mg/dL (ref 0.40–1.50)
GFR: 77.26 mL/min (ref >60.00)
Glucose, Bld: 96 mg/dL (ref 70–99)
Potassium: 4.2 mEq/L (ref 3.5–5.1)
Sodium: 141 mEq/L (ref 135–145)

## 2015-10-13 ENCOUNTER — Ambulatory Visit (HOSPITAL_COMMUNITY)
Admission: RE | Admit: 2015-10-13 | Discharge: 2015-10-13 | Disposition: A | Discharge status: Home / Self Care | Payer: Medicare Other | Source: Ambulatory Visit | Attending: Urology | Admitting: Urology

## 2015-10-13 DIAGNOSIS — C61 Malignant neoplasm of prostate: Secondary | ICD-10-CM | POA: Diagnosis not present

## 2015-10-13 DIAGNOSIS — R938 Abnormal findings on diagnostic imaging of other specified body structures: Secondary | ICD-10-CM | POA: Insufficient documentation

## 2015-10-13 MED ORDER — GADOBENATE DIMEGLUMINE 529 MG/ML IV SOLN
20.0000 mL | Freq: Once | INTRAVENOUS | Status: AC | PRN
  Administered 2015-10-13: 20 mL via INTRAVENOUS

## 2015-10-21 ENCOUNTER — Ambulatory Visit (INDEPENDENT_AMBULATORY_CARE_PROVIDER_SITE_OTHER): Payer: Medicare Other | Admitting: Adult Health

## 2015-10-21 ENCOUNTER — Encounter: Payer: Self-pay | Admitting: Adult Health

## 2015-10-21 ENCOUNTER — Encounter: Payer: Self-pay | Admitting: Internal Medicine

## 2015-10-21 VITALS — BP 152/78 | Temp 98.1°F | Ht 69.0 in | Wt 249.8 lb

## 2015-10-21 DIAGNOSIS — R1111 Vomiting without nausea: Secondary | ICD-10-CM

## 2015-10-21 DIAGNOSIS — I10 Essential (primary) hypertension: Secondary | ICD-10-CM

## 2015-10-21 DIAGNOSIS — Z0000 Encounter for general adult medical examination without abnormal findings: Secondary | ICD-10-CM | POA: Diagnosis not present

## 2015-10-21 NOTE — Patient Instructions (Signed)
Increase Norvasc to 10 mg daily and monitor your blood pressure at home. Write the readings down and bring the log to the next visit.   Follow up with me in 2 weeks for a recheck.   Someone from GI will call you to schedule your appointment.

## 2015-10-21 NOTE — Progress Notes (Signed)
Subjective:    Patient ID: Marcus [name], male    DOB: [birth year], 75 y.o.   MRN: [id number]  HPI Patient presents for yearly preventative medicine examination.  Per patient, someone from [facility] comes to their home each year and does the medicare wellness exam.   All immunizations and health maintenance protocols were reviewed with the patient and needed orders were placed.  Medication reconciliation,  past medical history, social history, problem list and allergies were reviewed in detail with the patient  Goals were established with regard to weight loss, exercise, and diet in compliance with medications  End of life planning was discussed. He has advanced directives and a living will.   He has had a left total knee replacement recently, and feels as though he is making progress with this. Continues to have limping gait.   He is up to date on colonoscopy, dental and eye exams.   Prostate Cancer - He recently had a MRI of the prostate done, which showed. IMPRESSION: 1. Findings suspicious for small volume peripheral zone carcinoma within the lateral left base to mid gland. 2. Left central mid gland nodule is relatively ill-defined. Although this could represent a benign prostatic hyperplasia nodule, given its morphology, central gland carcinoma cannot be excluded. If the patient undergoes repeat biopsy, consider sampling of this area. 3. A more cephalad anterior central gland area of vague T2 hypo intensity and early post-contrast enhancement is less suspicious, favored to be benign. 4. No evidence of locally advanced or pelvic metastatic disease.   He has a follow up appointment scheduled with Urology.   Vomiting  His only acute complaint is that of vomiting when he has a meal late at night. He is taking Protonix but he feels as though when he has a snack around 8:30-9pm and then goes to lay down, he will vomit and it feels like he is aspirating.   Past Medical History    Diagnosis Date  . Unspecified vitamin D deficiency   . Calcaneal spur   . Ventral hernia   . Hyperlipidemia   . Diverticulosis of colon (without mention of hemorrhage)   . Elevated prostate specific antigen (PSA)   . Gout, unspecified   . Unspecified essential hypertension   . History of colon cancer 1988  . Personal history of colonic polyps 08/21/2009    TUBULAR ADENOMAS  . Lumbar spondylolysis   . Vitamin B12 deficiency   . Postgastric surgery syndromes   . Coronary artery disease     dr Stanford Breed  . Normal nuclear stress test 04-23-2010    ef 51%,  septal hypokinesis, normal perfusion  . Echocardiogram findings abnormal, without diagnosis 04-23-2010    normal LV function, mod. left atrial enlargement, mild right artrial enlargement and grade 1 diastolic dysfunction  . Edema of lower extremity     ankles, elevates legs  . Hiatal hernia   . External hemorrhoids   . Esophageal reflux   . Arthritis     generalized  . Acute meniscal tear of knee left  . Frequency of urination     followed by dr dalhstedt  . Paraesophageal hernia     large  . OSA (obstructive sleep apnea)     does not use CPAP   . Pneumonia     history of; last episode 2015  . Prostate cancer ([place]) 2016  . Gout     Social History   Social History  . Marital Status: Married    Spouse Name:  N/A  . Number of Children: N/A  . Years of Education: N/A   Occupational History  . semi retired    Social History Main Topics  . Smoking status: Former Smoker -- 2.00 packs/day for 20 years    Types: Cigarettes    Quit date: 07/05/1974  . Smokeless tobacco: Never Used  . Alcohol Use: No     Comment: socially; also 1-2 glasses of wine or beer every other day   . Drug Use: No  . Sexual Activity: Not on file   Other Topics Concern  . Not on file   Social History Narrative   Married   Former Smoker    Alcohol use-yes 1-2 drinks per day      Occupation: Retired Midwife             Past Surgical  History  Procedure Laterality Date  . Vertical banded gastroplasty  1986  . Knee surgery      left  . Shoulder arthroscopy  05-23-2007    RIGHT  . Laminectomy and microdiscectomy lumbar spine  MARCH  2008    L3 -  4  . Knee arthroscopy  2007    RIGHT  . Sigmoid colectomy for cancer  1989  . Cataract extraction w/ intraocular lens  implant, bilateral    . Laparoscopic incisional / umbilical / ventral hernia repair  2006  . Knee arthroscopy  07/13/2011    Procedure: ARTHROSCOPY KNEE;  Surgeon: Cynda Familia;  Location: [facility];  Service: Orthopedics;  Laterality: Left;  partial menisectomy with chondrylplasty  . Colonoscopy    . Esophagogastroduodenoscopy    . Cardiac catheterization    . Total knee arthroplasty Left 05/23/2015    Procedure: LEFT TOTAL KNEE ARTHROPLASTY;  Surgeon: Sydnee Cabal, MD;  Location: [facility];  Service: Orthopedics;  Laterality: Left;    Family History  Problem Relation Age of Onset  . Stroke Paternal Grandfather   . Esophagitis Father     died from perforated esophagus  . Breast cancer Mother   . Heart disease Paternal Grandfather   . Colon cancer Maternal Grandmother     questionable    Allergies  Allergen Reactions  . Contrast Media [Iodinated Diagnostic Agents] Palpitations    TACHYCARDIA- patient states he has tolerated newer agents since this reaction >30 yrs ago    Current Outpatient Prescriptions on File Prior to Visit  Medication Sig Dispense Refill  . allopurinol (ZYLOPRIM) 300 MG tablet TAKE ONE TABLET BY MOUTH ONE TIME DAILY 90 tablet 0  . amLODipine (NORVASC) 5 MG tablet TAKE 1 TABLET (5 MG TOTAL) BY MOUTH DAILY. 90 tablet 1  . Cholecalciferol (VITAMIN D) 2000 UNITS CAPS Take 1 capsule by mouth daily.    [name] Kitchen HYDROcodone-acetaminophen (NORCO/VICODIN) 5-325 MG tablet TAKE 1 TO 2 TABLETS BY MOUTH EVERY 12 HOURS AS NEEDED FOR PAIN  0  . magnesium oxide (MAG-OX) 400 MG tablet Take 400 mg by mouth daily.    .  methocarbamol (ROBAXIN) 500 MG tablet TAKE 1 TABLET BY MOUTH EVERY 6 HOURS AS NEEDED FOR MUSCLES SPASMS  2  . Multiple Vitamins-Minerals (MULTIVITAMIN WITH MINERALS) tablet Take 1 tablet by mouth daily.    . pantoprazole (PROTONIX) 40 MG tablet TAKE 1 TABLET (40 MG TOTAL) BY MOUTH DAILY. TAKE 30-60 MIN BEFORE DINNER 30 tablet 2  . Potassium Gluconate 595 MG CAPS Take 1 capsule by mouth daily.    . simvastatin (ZOCOR) 20 MG tablet TAKE 1 TABLET (20  MG TOTAL) BY MOUTH AT BEDTIME. 90 tablet 0  . temazepam (RESTORIL) 30 MG capsule Take 1 capsule (30 mg total) by mouth at bedtime as needed for sleep. 30 capsule 0  . vitamin B-12 (CYANOCOBALAMIN) 1000 MCG tablet Take 1,000 mcg by mouth daily.     No current facility-administered medications on file prior to visit.    BP 152/78 mmHg  Temp(Src) 98.1 F (36.7 C) (Oral)  Ht 5\' 9"  (1.753 m)  Wt 249 lb 12.8 oz (113.309 kg)  BMI 36.87 kg/m2     Review of Systems  Constitutional: Negative.   HENT: Negative.   Eyes: Negative.   Respiratory: Negative.   Cardiovascular: Negative.   Gastrointestinal: Positive for vomiting.  Endocrine: Negative.   Genitourinary: Negative.   Musculoskeletal: Positive for arthralgias and gait problem.  Skin: Negative.   Allergic/Immunologic: Negative.   Neurological: Negative.   Hematological: Negative.   Psychiatric/Behavioral: Negative.   All other systems reviewed and are negative.      Objective:   Physical Exam  Constitutional: He is oriented to person, place, and time. He appears well-developed and well-nourished. No distress.  HENT:  Head: Normocephalic and atraumatic.  Right Ear: Hearing, tympanic membrane, external ear and ear canal normal.  Left Ear: Hearing, tympanic membrane, external ear and ear canal normal.  Nose: Nose normal.  Mouth/Throat: Oropharynx is clear and moist. No oropharyngeal exudate.  Eyes: Conjunctivae and EOM are normal. Pupils are equal, round, and reactive to light. Right  eye exhibits no discharge. Left eye exhibits no discharge. No scleral icterus.  Neck: Normal range of motion. Neck supple. No JVD present. Carotid bruit is not present. No tracheal deviation present. No thyromegaly present.  Cardiovascular: Normal rate, regular rhythm, normal heart sounds and intact distal pulses.  Exam reveals no gallop and no friction rub.   No murmur heard. Pulmonary/Chest: Effort normal and breath sounds normal. No stridor. No respiratory distress. He has no decreased breath sounds. He has no wheezes. He has no rales. He exhibits no tenderness.  Abdominal: Soft. Normal appearance and bowel sounds are normal. He exhibits ascites. He exhibits no distension and no mass. There is no tenderness. There is no rebound and no guarding. A hernia (umbilical ) is present.  Obese   Genitourinary:  Refused  Musculoskeletal: Normal range of motion. He exhibits no edema or tenderness.  Lymphadenopathy:    He has no cervical adenopathy.  Neurological: He is alert and oriented to person, place, and time. He has normal reflexes. He displays normal reflexes. No cranial nerve deficit. He exhibits normal muscle tone. Coordination normal.  Skin: Skin is warm and dry. No rash noted. He is not diaphoretic. No erythema. No pallor.  Psychiatric: He has a normal mood and affect. His behavior is normal. Judgment and thought content normal.  Nursing note and vitals reviewed.      Assessment & Plan:  1. Essential hypertension - Increase Norvasc from 5 mg to 10mg  daily.  - Monitor BP at home and bring log to visit in 2 weeks.  - Will consider adding second BP medication.  2. Routine general medical examination at a health care facility - Follow up in one year - Needs to work on diet and exercise  3. Non-intractable vomiting without nausea, vomiting of unspecified type - Advised to stop eating after 7 pm. If he is going to eat late at night then he should get a wedge pillow.  - Ambulatory  referral to Gastroenterology  Dorothyann Peng, NP

## 2015-10-22 ENCOUNTER — Other Ambulatory Visit: Payer: Self-pay | Admitting: Internal Medicine

## 2015-10-23 ENCOUNTER — Other Ambulatory Visit: Payer: Self-pay | Admitting: *Deleted

## 2015-10-23 MED ORDER — AMLODIPINE BESYLATE 10 MG PO TABS: 10.0000 mg | ORAL_TABLET | Freq: Every day | ORAL | Status: DC

## 2015-10-23 MED ORDER — AMLODIPINE BESYLATE 5 MG PO TABS: ORAL_TABLET | ORAL | Status: DC

## 2015-11-05 ENCOUNTER — Encounter: Payer: Self-pay | Admitting: Adult Health

## 2015-11-05 ENCOUNTER — Ambulatory Visit (INDEPENDENT_AMBULATORY_CARE_PROVIDER_SITE_OTHER): Payer: Medicare Other | Admitting: Adult Health

## 2015-11-05 VITALS — BP 158/80 | Temp 98.3°F | Ht 69.0 in | Wt 252.0 lb

## 2015-11-05 DIAGNOSIS — I10 Essential (primary) hypertension: Secondary | ICD-10-CM | POA: Diagnosis not present

## 2015-11-05 MED ORDER — VALSARTAN-HYDROCHLOROTHIAZIDE 320-25 MG PO TABS: 1.0000 | ORAL_TABLET | Freq: Every day | ORAL | Status: DC

## 2015-11-05 NOTE — Progress Notes (Signed)
Subjective:    Patient ID: Marcus [name], male    DOB: 10-10-1940, 75 y.o.   MRN: [id number]  HPI  75 year old active male, who presents to the office today for follow up regarding hypertension. I last saw him on 10/21/2015 at which time we went up on his Norvasc to 10 mg. He was asked to keep a log of his home BP and follow up  Today he reports that his blood pressures have not improved. He has not been checking daily but when he does they have been between [id number] systolic.   He reports that he is taking Norvasc every day  Denies any headaches or blurred vision  BP Readings from Last 3 Encounters:  11/05/15 158/80  10/21/15 152/78  08/07/15 128/80    Review of Systems  Constitutional: Negative.   Eyes: Negative.   Cardiovascular: Negative.   Neurological: Negative.    Past Medical History  Diagnosis Date  . Unspecified vitamin D deficiency   . Calcaneal spur   . Ventral hernia   . Hyperlipidemia   . Diverticulosis of colon (without mention of hemorrhage)   . Elevated prostate specific antigen (PSA)   . Gout, unspecified   . Unspecified essential hypertension   . History of colon cancer 1988  . Personal history of colonic polyps 08/21/2009    TUBULAR ADENOMAS  . Lumbar spondylolysis   . Vitamin B12 deficiency   . Postgastric surgery syndromes   . Coronary artery disease     dr Stanford Breed  . Normal nuclear stress test 04-23-2010    ef 51%,  septal hypokinesis, normal perfusion  . Echocardiogram findings abnormal, without diagnosis 04-23-2010    normal LV function, mod. left atrial enlargement, mild right artrial enlargement and grade 1 diastolic dysfunction  . Edema of lower extremity     ankles, elevates legs  . Hiatal hernia   . External hemorrhoids   . Esophageal reflux   . Arthritis     generalized  . Acute meniscal tear of knee left  . Frequency of urination     followed by dr dalhstedt  . Paraesophageal hernia     large  . OSA (obstructive sleep  apnea)     does not use CPAP   . Pneumonia     history of; last episode 2015  . Prostate cancer ([place]) 2016  . Gout     Social History   Social History  . Marital Status: Married    Spouse Name: N/A  . Number of Children: N/A  . Years of Education: N/A   Occupational History  . semi retired    Social History Main Topics  . Smoking status: Former Smoker -- 2.00 packs/day for 20 years    Types: Cigarettes    Quit date: 07/05/1974  . Smokeless tobacco: Never Used  . Alcohol Use: No     Comment: socially; also 1-2 glasses of wine or beer every other day   . Drug Use: No  . Sexual Activity: Not on file   Other Topics Concern  . Not on file   Social History Narrative   Married   Former Smoker    Alcohol use-yes 1-2 drinks per day      Occupation: Retired Midwife             Past Surgical History  Procedure Laterality Date  . Vertical banded gastroplasty  1986  . Knee surgery      left  . Shoulder arthroscopy  05-23-2007    RIGHT  . Laminectomy and microdiscectomy lumbar spine  MARCH  2008    L3 -  4  . Knee arthroscopy  2007    RIGHT  . Sigmoid colectomy for cancer  1989  . Cataract extraction w/ intraocular lens  implant, bilateral    . Laparoscopic incisional / umbilical / ventral hernia repair  2006  . Knee arthroscopy  07/13/2011    Procedure: ARTHROSCOPY KNEE;  Surgeon: Cynda Familia;  Location: [place];  Service: Orthopedics;  Laterality: Left;  partial menisectomy with chondrylplasty  . Colonoscopy    . Esophagogastroduodenoscopy    . Cardiac catheterization    . Total knee arthroplasty Left 05/23/2015    Procedure: LEFT TOTAL KNEE ARTHROPLASTY;  Surgeon: Sydnee Cabal, MD;  Location: [facility];  Service: Orthopedics;  Laterality: Left;    Family History  Problem Relation Age of Onset  . Stroke Paternal Grandfather   . Esophagitis Father     died from perforated esophagus  . Breast cancer Mother   . Heart disease  Paternal Grandfather   . Colon cancer Maternal Grandmother     questionable    Allergies  Allergen Reactions  . Contrast Media [Iodinated Diagnostic Agents] Palpitations    TACHYCARDIA- patient states he has tolerated newer agents since this reaction >30 yrs ago    Current Outpatient Prescriptions on File Prior to Visit  Medication Sig Dispense Refill  . allopurinol (ZYLOPRIM) 300 MG tablet TAKE ONE TABLET BY MOUTH ONE TIME DAILY 90 tablet 0  . amLODipine (NORVASC) 10 MG tablet Take 1 tablet (10 mg total) by mouth daily. 90 tablet 3  . Cholecalciferol (VITAMIN D) 2000 UNITS CAPS Take 1 capsule by mouth daily.    [name] Kitchen HYDROcodone-acetaminophen (NORCO/VICODIN) 5-325 MG tablet TAKE 1 TO 2 TABLETS BY MOUTH EVERY 12 HOURS AS NEEDED FOR PAIN  0  . magnesium oxide (MAG-OX) 400 MG tablet Take 400 mg by mouth daily.    . methocarbamol (ROBAXIN) 500 MG tablet TAKE 1 TABLET BY MOUTH EVERY 6 HOURS AS NEEDED FOR MUSCLES SPASMS  2  . Multiple Vitamins-Minerals (MULTIVITAMIN WITH MINERALS) tablet Take 1 tablet by mouth daily.    . pantoprazole (PROTONIX) 40 MG tablet TAKE 1 TABLET (40 MG TOTAL) BY MOUTH DAILY. TAKE 30-60 MIN BEFORE DINNER 30 tablet 2  . Potassium Gluconate 595 MG CAPS Take 1 capsule by mouth daily.    . simvastatin (ZOCOR) 20 MG tablet TAKE 1 TABLET (20 MG TOTAL) BY MOUTH AT BEDTIME. 90 tablet 0  . temazepam (RESTORIL) 30 MG capsule Take 1 capsule (30 mg total) by mouth at bedtime as needed for sleep. 30 capsule 0  . vitamin B-12 (CYANOCOBALAMIN) 1000 MCG tablet Take 1,000 mcg by mouth daily.     No current facility-administered medications on file prior to visit.    BP 158/80 mmHg  Temp(Src) 98.3 F (36.8 C) (Oral)  Ht 5\' 9"  (1.753 m)  Wt 252 lb (114.306 kg)  BMI 37.20 kg/m2       Objective:   Physical Exam  Constitutional: He is oriented to person, place, and time. He appears well-developed and well-nourished. No distress.  Cardiovascular: Normal rate, regular rhythm,  normal heart sounds and intact distal pulses.  Exam reveals no gallop and no friction rub.   No murmur heard. Pulmonary/Chest: Effort normal and breath sounds normal. No respiratory distress. He has no wheezes. He has no rales. He exhibits no tenderness.  Neurological: He is alert and  oriented to person, place, and time. He has normal reflexes.  Skin: Skin is warm and dry. No rash noted. He is not diaphoretic. No erythema. No pallor.  Psychiatric: He has a normal mood and affect. His behavior is normal. Judgment and thought content normal.  Nursing note and vitals reviewed.     Assessment & Plan:  1. Essential hypertension - valsartan-hydrochlorothiazide (DIOVAN-HCT) 320-25 MG tablet; Take 1 tablet by mouth daily.  Dispense: 90 tablet; Refill: 1 - Monitor more closely at home - Follow up in 2-3 weeks for recheck  Dorothyann Peng, NP

## 2015-11-05 NOTE — Patient Instructions (Signed)
It was great seeing you again!  I have sent in a medication called Diovan-HCT.   Start taking this daily.   Follow up with me in 3 weeks

## 2015-11-24 ENCOUNTER — Encounter: Payer: Self-pay | Admitting: Internal Medicine

## 2015-11-24 ENCOUNTER — Ambulatory Visit (INDEPENDENT_AMBULATORY_CARE_PROVIDER_SITE_OTHER): Payer: Medicare Other | Admitting: Internal Medicine

## 2015-11-24 VITALS — BP 120/60 | HR 64 | Ht 67.0 in | Wt 251.0 lb

## 2015-11-24 DIAGNOSIS — K224 Dyskinesia of esophagus: Secondary | ICD-10-CM | POA: Diagnosis not present

## 2015-11-24 DIAGNOSIS — K219 Gastro-esophageal reflux disease without esophagitis: Secondary | ICD-10-CM | POA: Diagnosis not present

## 2015-11-24 DIAGNOSIS — K911 Postgastric surgery syndromes: Secondary | ICD-10-CM | POA: Diagnosis not present

## 2015-11-24 MED ORDER — METOCLOPRAMIDE HCL 10 MG PO TABS: 10.0000 mg | ORAL_TABLET | Freq: Every evening | ORAL | Status: DC | PRN

## 2015-11-24 NOTE — Progress Notes (Signed)
Subjective:    Patient ID: Marcus [name], male    DOB: [birth year], 75 y.o.   MRN: [id number] Cc: regurgitation, choking HPI Patient is a very nice elderly white man here with his wife. Previously followed by Dr. Sharlett Iles, history of colon cancer remotely in 1989, negative surveillance today, history of a gastric stapling procedure and intermittent regurgitation and reflux problems. He was last seen several weeks ago by Randel Books, she moved his PPI to before supper time and advised lifestyle changes. The patient has problems with nocturnal regurgitation and sometimes aspiration and a lot of choking and coughing the can last for hours. He is concerned about this. He readily recognizes that it often occurs related to eating less than 3 hours before going to bed. He thinks he may be a little better with the medication changes but still has his previous problem. He has not raise his head of bed. He says that sometimes just because eating schedule he eats late and these problems might occur. He says his father had problems with esophageal constriction and required dilation. The patient has had at least 2 endoscopy procedures and no strictures have been seen though he did receive a 16 [place] in P dilation with a Maloney dilator in 2008. When questioned carefully, he does not have true esophageal dysphagia symptoms but he regurgitates after he eats. Again this is mostly at night if not exclusively. He does not seem to have heartburn on a frequent or even an infrequent basis at other times.  He had some sort of gastric stapling procedure as mentioned above 35 years ago to try to lose weight, that is distorted his stomach anatomy and gave the impression of a possible paraesophageal hernia and endoscopy, that was not confirmed on upper GI series in 2014. However there is altered anatomy and the stomach did not distend well. There is no unintentional weight loss.  Wt Readings from Last 3 Encounters:    11/24/15 251 lb (113.853 kg)  11/05/15 252 lb (114.306 kg)  10/21/15 249 lb 12.8 oz (113.309 kg)   Medications, allergies, past medical history, past surgical history, family history and social history are reviewed and updated in the EMR.   Review of Systems Poor sleep    Objective:   Physical Exam @BP  120/60 mmHg  Pulse 64  Ht 5\' 7"  (1.702 m)  Wt 251 lb (113.853 kg)  BMI 39.30 kg/m2@  General:  NAD Eyes:   anicteric Lungs:  clear Heart::  S1S2 no rubs, murmurs or gallops Abdomen:  Obese, soft and nontender, BS+, no hernia, long midline scar with small firm SQ nodule right and superior to umbilicus Ext:   no edema, cyanosis or clubbing  Data Reviewed:   2014, 2008 EGD reports and images 2014 UGI series report and images     Assessment & Plan:   Encounter Diagnoses  Name Primary?  . Gastroesophageal reflux disease without esophagitis Yes  . Esophageal dysmotility   . Post gastrectomy syndrome    Regurgitation and possible aspiration at night intermittently. Likely causes seem to be esophageal dysmotility, reflux, and altered gastric anatomy and perhaps function after gastric stapling. He seems to be ok if follows lifestyle changes but some episodes are severe.  1) Elevate HOB 2) Avoid eating 3+ hrs before bed 3) continue PPI - ok to use up omeprazole he has also - take before supper 4) metaclopramide 5-10 mg qhs prn 5) RTC 2 months 6) I have reviewed potential side effects of  metaclopramide especially possible tardive dyskinesia   CC: Dorothyann Peng, NP

## 2015-11-24 NOTE — Patient Instructions (Addendum)
We have sent the following medications to your pharmacy for you to pick up at your convenience: Reglan  Please raise the head of your bed to help with your reflux.  We have given you a reflux diet pamphlet to read and follow.  Pleas return to see Dr Carlean Purl in 2-3 months, appointment made for 01/30/16 at 2:00pm    I appreciate the opportunity to care for you. Silvano Rusk, MD, [facility]

## 2015-11-26 ENCOUNTER — Encounter: Payer: Self-pay | Admitting: Adult Health

## 2015-11-26 ENCOUNTER — Ambulatory Visit (INDEPENDENT_AMBULATORY_CARE_PROVIDER_SITE_OTHER): Payer: Medicare Other | Admitting: Adult Health

## 2015-11-26 VITALS — BP 120/64 | Temp 98.1°F | Ht 67.0 in | Wt 253.2 lb

## 2015-11-26 DIAGNOSIS — I10 Essential (primary) hypertension: Secondary | ICD-10-CM

## 2015-11-26 NOTE — Progress Notes (Signed)
Subjective:    Patient ID: EUGENIO TIEN, male    DOB: 11/03/1940, 75 y.o.   MRN: [id number]  HPI  75 year old male who returns to the clinic today for follow up regarding hypertension. I last saw him on 11/05/2015 at which time his blood pressures were still in the [id number] systolic despite increasing amlodipine. At that time I put him on Diovan-HCT 320-25mg . He reports that his blood pressures have been consistently in the 120's/60. He does not experience any adverse side effects.   He has no other issues he would like to discuss today   Review of Systems  Constitutional: Negative.   Respiratory: Negative.   Neurological: Negative.   All other systems reviewed and are negative.  Past Medical History  Diagnosis Date  . Unspecified vitamin D deficiency   . Calcaneal spur   . Ventral hernia   . Hyperlipidemia   . Diverticulosis of colon (without mention of hemorrhage)   . Elevated prostate specific antigen (PSA)   . Gout, unspecified   . Unspecified essential hypertension   . History of colon cancer 1988  . Personal history of colonic polyps 08/21/2009    TUBULAR ADENOMAS  . Lumbar spondylolysis   . Vitamin B12 deficiency   . Postgastric surgery syndromes   . Coronary artery disease     dr Stanford Breed  . Normal nuclear stress test 04-23-2010    ef 51%,  septal hypokinesis, normal perfusion  . Echocardiogram findings abnormal, without diagnosis 04-23-2010    normal LV function, mod. left atrial enlargement, mild right artrial enlargement and grade 1 diastolic dysfunction  . Edema of lower extremity     ankles, elevates legs  . Hiatal hernia   . External hemorrhoids   . Esophageal reflux   . Arthritis     generalized  . Acute meniscal tear of knee left  . Frequency of urination     followed by dr dalhstedt  . Paraesophageal hernia     large  . OSA (obstructive sleep apnea)     does not use CPAP   . Pneumonia     history of; last episode 2015  . Prostate cancer ([place]) 2016   . Gout     Social History   Social History  . Marital Status: Married    Spouse Name: N/A  . Number of Children: 4  . Years of Education: N/A   Occupational History  . retired    Social History Main Topics  . Smoking status: Former Smoker -- 2.00 packs/day for 20 years    Types: Cigarettes    Quit date: 07/05/1974  . Smokeless tobacco: Never Used  . Alcohol Use: 0.0 oz/week    0 Standard drinks or equivalent per week     Comment: socially; also 1-2 glasses of wine or beer every other day   . Drug Use: No  . Sexual Activity: Not on file   Other Topics Concern  . Not on file   Social History Narrative   Married   Former Smoker    Alcohol use-yes 1-2 drinks per day      Occupation: Retired Midwife      Originally from [place], [place] - in [place] > 10 yrs as of 2017          Past Surgical History  Procedure Laterality Date  . Vertical banded gastroplasty  1986  . Knee surgery Left   . Shoulder arthroscopy Right 05-23-2007  . Laminectomy and microdiscectomy lumbar  spine  MARCH  2008    L3 -  4  . Knee arthroscopy Right 2007  . Sigmoid colectomy for cancer  1989  . Cataract extraction w/ intraocular lens  implant, bilateral Bilateral   . Laparoscopic incisional / umbilical / ventral hernia repair  2006  . Knee arthroscopy  07/13/2011    Procedure: ARTHROSCOPY KNEE;  Surgeon: Cynda Familia;  Location: [place];  Service: Orthopedics;  Laterality: Left;  partial menisectomy with chondrylplasty  . Colonoscopy    . Esophagogastroduodenoscopy    . Cardiac catheterization    . Total knee arthroplasty Left 05/23/2015    Procedure: LEFT TOTAL KNEE ARTHROPLASTY;  Surgeon: Sydnee Cabal, MD;  Location: [facility];  Service: Orthopedics;  Laterality: Left;  . Prostate biopsy      Family History  Problem Relation Age of Onset  . Stroke Paternal Grandfather   . Esophagitis Father     died from perforated esophagus  . Breast cancer Mother   . Heart  disease Paternal Grandfather   . Colon cancer Maternal Grandmother     questionable    Allergies  Allergen Reactions  . Contrast Media [Iodinated Diagnostic Agents] Palpitations    TACHYCARDIA- patient states he has tolerated newer agents since this reaction >30 yrs ago    Current Outpatient Prescriptions on File Prior to Visit  Medication Sig Dispense Refill  . allopurinol (ZYLOPRIM) 300 MG tablet TAKE ONE TABLET BY MOUTH ONE TIME DAILY 90 tablet 0  . amLODipine (NORVASC) 10 MG tablet Take 1 tablet (10 mg total) by mouth daily. 90 tablet 3  . Cholecalciferol (VITAMIN D) 2000 UNITS CAPS Take 1 capsule by mouth daily.    . Cyanocobalamin (VITAMIN B-12) 5000 MCG TBDP Take 1 tablet by mouth daily.     [name] Calcium (STOOL SOFTENER PO) Take 1 tablet by mouth daily.    . magnesium oxide (MAG-OX) 400 MG tablet Take 400 mg by mouth daily.    . metoCLOPramide (REGLAN) 10 MG tablet Take 1 tablet (10 mg total) by mouth at bedtime as needed for nausea. Try half-tablet to start - 30 tablet 2  . Multiple Vitamins-Minerals (MULTIVITAMIN WITH MINERALS) tablet Take 1 tablet by mouth daily.    . Oxymetazoline HCl (AFRIN NASAL SPRAY NA) Place 1 Squirt into the nose at bedtime.    . pantoprazole (PROTONIX) 40 MG tablet TAKE 1 TABLET (40 MG TOTAL) BY MOUTH DAILY. TAKE 30-60 MIN BEFORE DINNER 30 tablet 2  . Potassium Gluconate 595 MG CAPS Take 1 capsule by mouth daily.    . simvastatin (ZOCOR) 20 MG tablet TAKE 1 TABLET (20 MG TOTAL) BY MOUTH AT BEDTIME. 90 tablet 0  . valsartan-hydrochlorothiazide (DIOVAN-HCT) 320-25 MG tablet Take 1 tablet by mouth daily. 90 tablet 1   No current facility-administered medications on file prior to visit.    BP 120/64 mmHg  Temp(Src) 98.1 F (36.7 C) (Oral)  Ht 5\' 7"  (1.702 m)  Wt 253 lb 3.2 oz (114.851 kg)  BMI 39.65 kg/m2       Objective:   Physical Exam  Constitutional: He is oriented to person, place, and time. He appears well-developed and  well-nourished. No distress.  Cardiovascular: Normal rate, regular rhythm, normal heart sounds and intact distal pulses.  Exam reveals no gallop and no friction rub.   Pulmonary/Chest: Effort normal and breath sounds normal. No respiratory distress. He has no wheezes. He has no rales. He exhibits no tenderness.  Neurological: He is alert and  oriented to person, place, and time.  Skin: Skin is warm and dry. No rash noted. He is not diaphoretic. No erythema. No pallor.  Psychiatric: He has a normal mood and affect. His behavior is normal. Judgment and thought content normal.  Nursing note and vitals reviewed.     Assessment & Plan:  1. Essential hypertension - now well controlled.  - No change in medication - Continue to monitor at home and let me know if BP above 140 consistently.  - Follow up as needed  Dorothyann Peng, NP

## 2016-01-02 ENCOUNTER — Other Ambulatory Visit: Payer: Self-pay | Admitting: Internal Medicine

## 2016-01-30 ENCOUNTER — Ambulatory Visit: Payer: Medicare Other | Admitting: Physician Assistant

## 2016-01-30 ENCOUNTER — Ambulatory Visit: Payer: Medicare Other | Admitting: Internal Medicine

## 2016-02-03 ENCOUNTER — Encounter: Payer: Self-pay | Admitting: Adult Health

## 2016-02-03 ENCOUNTER — Ambulatory Visit (INDEPENDENT_AMBULATORY_CARE_PROVIDER_SITE_OTHER): Payer: Medicare Other | Admitting: Adult Health

## 2016-02-03 VITALS — BP 136/68 | Temp 98.6°F | Ht 67.0 in | Wt 256.9 lb

## 2016-02-03 DIAGNOSIS — S80811A Abrasion, right lower leg, initial encounter: Secondary | ICD-10-CM | POA: Diagnosis not present

## 2016-02-03 MED ORDER — CEPHALEXIN 500 MG PO CAPS: 500.0000 mg | ORAL_CAPSULE | Freq: Two times a day (BID) | ORAL | 0 refills | Status: DC

## 2016-02-03 NOTE — Patient Instructions (Signed)
I have sent in a prescription for Keflex ( antibiotic) take this twice a day for 7 days. I would recommend neosporin on the wound and keep covered with a bandage.   Also, use Flonase or an over the counter allergy medicine to help with the cough.   Follow up if no improvement.

## 2016-02-03 NOTE — Progress Notes (Signed)
Subjective:    Patient ID: Marcus [name], male    DOB: [birth year], 75 y.o.   MRN: [id number]  HPI  75 year old male who presents to the office today for a wound check. He reports that two weeks ago he scraped his right shin on a concrete step. He wants to make sure that the wound is not infected. He has not been keeping the wound covered or placing any antibiotic ointment on it.   He reports that over the last two days he has noticed more redness around the wound.   Review of Systems  Constitutional: Negative.   Genitourinary: Negative.   Skin: Positive for color change and wound.  All other systems reviewed and are negative.  Past Medical History:  Diagnosis Date  . Acute meniscal tear of knee left  . Arthritis    generalized  . Calcaneal spur   . Coronary artery disease    dr Stanford Breed  . Diverticulosis of colon (without mention of hemorrhage)   . Echocardiogram findings abnormal, without diagnosis 04-23-2010   normal LV function, mod. left atrial enlargement, mild right artrial enlargement and grade 1 diastolic dysfunction  . Edema of lower extremity    ankles, elevates legs  . Elevated prostate specific antigen (PSA)   . Esophageal reflux   . External hemorrhoids   . Frequency of urination    followed by dr dalhstedt  . Gout   . Gout, unspecified   . Hiatal hernia   . History of colon cancer 1988  . Hyperlipidemia   . Lumbar spondylolysis   . Normal nuclear stress test 04-23-2010   ef 51%,  septal hypokinesis, normal perfusion  . OSA (obstructive sleep apnea)    does not use CPAP   . Paraesophageal hernia    large  . Personal history of colonic polyps 08/21/2009   TUBULAR ADENOMAS  . Pneumonia    history of; last episode 2015  . Postgastric surgery syndromes   . Prostate cancer ([facility]) 2016  . Unspecified essential hypertension   . Unspecified vitamin D deficiency   . Ventral hernia   . Vitamin B12 deficiency     Social History   Social History  .  Marital status: Married    Spouse name: N/A  . Number of children: 4  . Years of education: N/A   Occupational History  . retired [employer] Topics  . Smoking status: Former Smoker    Packs/day: 2.00    Years: 20.00    Types: Cigarettes    Quit date: 07/05/1974  . Smokeless tobacco: Never Used  . Alcohol use 0.0 oz/week     Comment: socially; also 1-2 glasses of wine or beer every other day   . Drug use: No  . Sexual activity: Not on file   Other Topics Concern  . Not on file   Social History Narrative   Married   Former Smoker    Alcohol use-yes 1-2 drinks per day      Occupation: Retired Midwife      Originally from [place], [place] - in [place] > 10 yrs as of 2017          Past Surgical History:  Procedure Laterality Date  . CARDIAC CATHETERIZATION    . CATARACT EXTRACTION W/ INTRAOCULAR LENS  IMPLANT, BILATERAL Bilateral   . COLONOSCOPY    . ESOPHAGOGASTRODUODENOSCOPY    . KNEE ARTHROSCOPY Right 2007  . KNEE ARTHROSCOPY  07/13/2011  Procedure: ARTHROSCOPY KNEE;  Surgeon: Cynda Familia;  Location: [facility];  Service: Orthopedics;  Laterality: Left;  partial menisectomy with chondrylplasty  . KNEE SURGERY Left   . LAMINECTOMY AND MICRODISCECTOMY LUMBAR SPINE  MARCH  2008   L3 -  4  . LAPAROSCOPIC INCISIONAL / UMBILICAL / VENTRAL HERNIA REPAIR  2006  . PROSTATE BIOPSY    . SHOULDER ARTHROSCOPY Right 05-23-2007  . SIGMOID COLECTOMY FOR CANCER  1989  . TOTAL KNEE ARTHROPLASTY Left 05/23/2015   Procedure: LEFT TOTAL KNEE ARTHROPLASTY;  Surgeon: Sydnee Cabal, MD;  Location: [facility];  Service: Orthopedics;  Laterality: Left;  [name] Kitchen VERTICAL BANDED GASTROPLASTY  1986    Family History  Problem Relation Age of Onset  . Stroke Paternal Grandfather   . Esophagitis Father     died from perforated esophagus  . Breast cancer Mother   . Heart disease Paternal Grandfather   . Colon cancer Maternal Grandmother     questionable      Allergies  Allergen Reactions  . Contrast Media [Iodinated Diagnostic Agents] Palpitations    TACHYCARDIA- patient states he has tolerated newer agents since this reaction >30 yrs ago    Current Outpatient Prescriptions on File Prior to Visit  Medication Sig Dispense Refill  . allopurinol (ZYLOPRIM) 300 MG tablet TAKE ONE TABLET BY MOUTH ONE TIME DAILY 90 tablet 1  . amLODipine (NORVASC) 10 MG tablet Take 1 tablet (10 mg total) by mouth daily. 90 tablet 3  . Cholecalciferol (VITAMIN D) 2000 UNITS CAPS Take 1 capsule by mouth daily.    . Cyanocobalamin (VITAMIN B-12) 5000 MCG TBDP Take 1 tablet by mouth daily.     [name] Calcium (STOOL SOFTENER PO) Take 1 tablet by mouth daily.    . magnesium oxide (MAG-OX) 400 MG tablet Take 400 mg by mouth daily.    . metoCLOPramide (REGLAN) 10 MG tablet Take 1 tablet (10 mg total) by mouth at bedtime as needed for nausea. Try half-tablet to start - 30 tablet 2  . Multiple Vitamins-Minerals (MULTIVITAMIN WITH MINERALS) tablet Take 1 tablet by mouth daily.    . Oxymetazoline HCl (AFRIN NASAL SPRAY NA) Place 1 Squirt into the nose at bedtime.    . pantoprazole (PROTONIX) 40 MG tablet TAKE 1 TABLET (40 MG TOTAL) BY MOUTH DAILY. TAKE 30-60 MIN BEFORE DINNER 30 tablet 2  . Potassium Gluconate 595 MG CAPS Take 1 capsule by mouth daily.    . simvastatin (ZOCOR) 20 MG tablet TAKE 1 TABLET (20 MG TOTAL) BY MOUTH AT BEDTIME. 90 tablet 0  . valsartan-hydrochlorothiazide (DIOVAN-HCT) 320-25 MG tablet Take 1 tablet by mouth daily. 90 tablet 1   No current facility-administered medications on file prior to visit.     BP 136/68   Temp 98.6 F (37 C) (Oral)   Ht 5\' 7"  (1.702 m)   Wt 256 lb 14.4 oz (116.5 kg)   BMI 40.24 kg/m       Objective:   Physical Exam  Constitutional: He is oriented to person, place, and time. He appears well-developed and well-nourished. No distress.  Cardiovascular: Normal rate, regular rhythm, normal heart sounds and intact  distal pulses.  Exam reveals no gallop and no friction rub.   No murmur heard. Pulmonary/Chest: Effort normal and breath sounds normal. No respiratory distress. He has no wheezes. He has no rales. He exhibits no tenderness.  Neurological: He is alert and oriented to person, place, and time.  Skin: Skin is warm and  dry. Abrasion noted. No rash noted. He is not diaphoretic. There is erythema. No pallor.     Psychiatric: He has a normal mood and affect. His behavior is normal. Judgment and thought content normal.  Nursing note and vitals reviewed.     Assessment & Plan:  1. Abrasion of right leg, initial encounter - Advised to keep wound covered and apply neosporin - cephALEXin (KEFLEX) 500 MG capsule; Take 1 capsule (500 mg total) by mouth 2 (two) times daily.  Dispense: 14 capsule; Refill: 0 - Follow up if no improvement  Dorothyann Peng, NP

## 2016-02-04 DIAGNOSIS — R350 Frequency of micturition: Secondary | ICD-10-CM | POA: Diagnosis not present

## 2016-02-04 DIAGNOSIS — N401 Enlarged prostate with lower urinary tract symptoms: Secondary | ICD-10-CM | POA: Diagnosis not present

## 2016-02-04 DIAGNOSIS — C61 Malignant neoplasm of prostate: Secondary | ICD-10-CM | POA: Diagnosis not present

## 2016-02-05 ENCOUNTER — Other Ambulatory Visit: Payer: Self-pay | Admitting: Adult Health

## 2016-02-10 ENCOUNTER — Telehealth: Payer: Self-pay | Admitting: Adult Health

## 2016-02-10 NOTE — Telephone Encounter (Signed)
°  Pt call and said he would like to have the skin tags removed from his eyes and would like to know who Tommi Rumps would refer.

## 2016-02-11 NOTE — Telephone Encounter (Signed)
We should be ok doing it in the office or he can follow up with Dermatology or I can refer to plastic surgery

## 2016-02-11 NOTE — Telephone Encounter (Signed)
Patient notified and appt scheduled.

## 2016-02-13 ENCOUNTER — Ambulatory Visit (INDEPENDENT_AMBULATORY_CARE_PROVIDER_SITE_OTHER): Payer: Medicare Other | Admitting: Adult Health

## 2016-02-13 ENCOUNTER — Encounter: Payer: Self-pay | Admitting: Adult Health

## 2016-02-13 VITALS — BP 126/60 | Temp 98.6°F | Wt 249.2 lb

## 2016-02-13 DIAGNOSIS — D2339 Other benign neoplasm of skin of other parts of face: Secondary | ICD-10-CM

## 2016-02-13 DIAGNOSIS — D492 Neoplasm of unspecified behavior of bone, soft tissue, and skin: Secondary | ICD-10-CM | POA: Diagnosis not present

## 2016-02-13 DIAGNOSIS — L570 Actinic keratosis: Secondary | ICD-10-CM | POA: Diagnosis not present

## 2016-02-13 NOTE — Progress Notes (Signed)
Subjective:    Patient ID: Marcus [name], male    DOB: 1940/07/28, 75 y.o.   MRN: [id number]  HPI  75 year old male who presents to the office today to have a " skin tag" removed from his face. He denies any complications with this skin tag  Review of Systems  Constitutional: Negative.   Skin: Negative.   All other systems reviewed and are negative.  Past Medical History:  Diagnosis Date  . Acute meniscal tear of knee left  . Arthritis    generalized  . Calcaneal spur   . Coronary artery disease    dr Stanford Breed  . Diverticulosis of colon (without mention of hemorrhage)   . Echocardiogram findings abnormal, without diagnosis 04-23-2010   normal LV function, mod. left atrial enlargement, mild right artrial enlargement and grade 1 diastolic dysfunction  . Edema of lower extremity    ankles, elevates legs  . Elevated prostate specific antigen (PSA)   . Esophageal reflux   . External hemorrhoids   . Frequency of urination    followed by dr dalhstedt  . Gout   . Gout, unspecified   . Hiatal hernia   . History of colon cancer 1988  . Hyperlipidemia   . Lumbar spondylolysis   . Normal nuclear stress test 04-23-2010   ef 51%,  septal hypokinesis, normal perfusion  . OSA (obstructive sleep apnea)    does not use CPAP   . Paraesophageal hernia    large  . Personal history of colonic polyps 08/21/2009   TUBULAR ADENOMAS  . Pneumonia    history of; last episode 2015  . Postgastric surgery syndromes   . Prostate cancer ([facility]) 2016  . Unspecified essential hypertension   . Unspecified vitamin D deficiency   . Ventral hernia   . Vitamin B12 deficiency     Social History   Social History  . Marital status: Married    Spouse name: N/A  . Number of children: 4  . Years of education: N/A   Occupational History  . retired [employer] Topics  . Smoking status: Former Smoker    Packs/day: 2.00    Years: 20.00    Types: Cigarettes    Quit  date: 07/05/1974  . Smokeless tobacco: Never Used  . Alcohol use 0.0 oz/week     Comment: socially; also 1-2 glasses of wine or beer every other day   . Drug use: No  . Sexual activity: Not on file   Other Topics Concern  . Not on file   Social History Narrative   Married   Former Smoker    Alcohol use-yes 1-2 drinks per day      Occupation: Retired Midwife      Originally from [place], [place] - in [place] > 10 yrs as of 2017          Past Surgical History:  Procedure Laterality Date  . CARDIAC CATHETERIZATION    . CATARACT EXTRACTION W/ INTRAOCULAR LENS  IMPLANT, BILATERAL Bilateral   . COLONOSCOPY    . ESOPHAGOGASTRODUODENOSCOPY    . KNEE ARTHROSCOPY Right 2007  . KNEE ARTHROSCOPY  07/13/2011   Procedure: ARTHROSCOPY KNEE;  Surgeon: Cynda Familia;  Location: [facility];  Service: Orthopedics;  Laterality: Left;  partial menisectomy with chondrylplasty  . KNEE SURGERY Left   . LAMINECTOMY AND MICRODISCECTOMY LUMBAR SPINE  MARCH  2008   L3 -  4  . LAPAROSCOPIC INCISIONAL /  UMBILICAL / VENTRAL HERNIA REPAIR  2006  . PROSTATE BIOPSY    . SHOULDER ARTHROSCOPY Right 05-23-2007  . SIGMOID COLECTOMY FOR CANCER  1989  . TOTAL KNEE ARTHROPLASTY Left 05/23/2015   Procedure: LEFT TOTAL KNEE ARTHROPLASTY;  Surgeon: Sydnee Cabal, MD;  [facility];  Service: Orthopedics;  Laterality: Left;  [name] Kitchen VERTICAL BANDED GASTROPLASTY  1986    Family History  Problem Relation Age of Onset  . Stroke Paternal Grandfather   . Esophagitis Father     died from perforated esophagus  . Breast cancer Mother   . Heart disease Paternal Grandfather   . Colon cancer Maternal Grandmother     questionable    Allergies  Allergen Reactions  . Contrast Media [Iodinated Diagnostic Agents] Palpitations    TACHYCARDIA- patient states he has tolerated newer agents since this reaction >30 yrs ago    Current Outpatient Prescriptions on File Prior to Visit  Medication Sig Dispense  Refill  . allopurinol (ZYLOPRIM) 300 MG tablet TAKE ONE TABLET BY MOUTH ONE TIME DAILY 90 tablet 1  . amLODipine (NORVASC) 10 MG tablet Take 1 tablet (10 mg total) by mouth daily. 90 tablet 3  . cephALEXin (KEFLEX) 500 MG capsule Take 1 capsule (500 mg total) by mouth 2 (two) times daily. 14 capsule 0  . Cholecalciferol (VITAMIN D) 2000 UNITS CAPS Take 1 capsule by mouth daily.    . Cyanocobalamin (VITAMIN B-12) 5000 MCG TBDP Take 1 tablet by mouth daily.     [name] Calcium (STOOL SOFTENER PO) Take 1 tablet by mouth daily.    . magnesium oxide (MAG-OX) 400 MG tablet Take 400 mg by mouth daily.    . metoCLOPramide (REGLAN) 10 MG tablet Take 1 tablet (10 mg total) by mouth at bedtime as needed for nausea. Try half-tablet to start - 30 tablet 2  . Multiple Vitamins-Minerals (MULTIVITAMIN WITH MINERALS) tablet Take 1 tablet by mouth daily.    . Oxymetazoline HCl (AFRIN NASAL SPRAY NA) Place 1 Squirt into the nose at bedtime.    . pantoprazole (PROTONIX) 40 MG tablet TAKE 1 TABLET (40 MG TOTAL) BY MOUTH DAILY. TAKE 30-60 MIN BEFORE DINNER 30 tablet 2  . Potassium Gluconate 595 MG CAPS Take 1 capsule by mouth daily.    . simvastatin (ZOCOR) 20 MG tablet TAKE 1 TABLET BY MOUTH AT BEDTIME. 90 tablet 0  . valsartan-hydrochlorothiazide (DIOVAN-HCT) 320-25 MG tablet Take 1 tablet by mouth daily. 90 tablet 1   No current facility-administered medications on file prior to visit.     BP 126/60   Temp 98.6 F (37 C) (Oral)   Wt 249 lb 3.2 oz (113 kg)   BMI 39.03 kg/m       Objective:   Physical Exam  Constitutional: He is oriented to person, place, and time. He appears well-developed and well-nourished. No distress.  Neurological: He is alert and oriented to person, place, and time.  Skin: Skin is warm and dry. No rash noted. He is not diaphoretic. No erythema. No pallor.  Small 33mm skin neoplasm that appears as a cutaneous horn. This is located on left upper cheek, below eye  Psychiatric: He  has a normal mood and affect. His behavior is normal. Judgment and thought content normal.      Assessment & Plan:  1. Skin neoplasm Procedure including risks/benefits explained to patient.  Questions were answered. After informed consent was obtained and a time out completed, site was cleansed with betadine and then alcohol. Anesthesia  achieved with topical cold spray and a shave biopsy was performed. Area was cauterized to obtain hemostasis.  Pt tolerated procedure well.  Specimen sent for pathology review.  Pt instructed to keep the area dry for 24 hours and to contact us if he develops redness, drainage or swelling at the site.  Pt may use tylenol as needed for discomfort today.   - Dermatology pathology  Dorothyann Peng, NP

## 2016-03-01 ENCOUNTER — Other Ambulatory Visit: Payer: Self-pay | Admitting: Internal Medicine

## 2016-03-01 NOTE — Telephone Encounter (Signed)
Rx refill sent to pharmacy. 

## 2016-03-04 ENCOUNTER — Telehealth: Payer: Self-pay

## 2016-03-04 MED ORDER — METOCLOPRAMIDE HCL 10 MG PO TABS: 10.0000 mg | ORAL_TABLET | Freq: Every evening | ORAL | 2 refills | Status: DC | PRN

## 2016-03-04 NOTE — Telephone Encounter (Signed)
Ok x 3 but I need to see him again also _Nov/Dec

## 2016-03-04 NOTE — Telephone Encounter (Signed)
May I refill his metoclopramide?

## 2016-03-04 NOTE — Telephone Encounter (Signed)
reglan refill sent in and patient has upcoming Oct appt.

## 2016-03-05 ENCOUNTER — Other Ambulatory Visit: Payer: Self-pay

## 2016-03-05 MED ORDER — METOCLOPRAMIDE HCL 10 MG PO TABS: 10.0000 mg | ORAL_TABLET | Freq: Every evening | ORAL | 0 refills | Status: DC | PRN

## 2016-03-09 ENCOUNTER — Encounter: Payer: Self-pay | Admitting: Podiatry

## 2016-03-09 ENCOUNTER — Ambulatory Visit (INDEPENDENT_AMBULATORY_CARE_PROVIDER_SITE_OTHER): Payer: Medicare Other

## 2016-03-09 ENCOUNTER — Ambulatory Visit (INDEPENDENT_AMBULATORY_CARE_PROVIDER_SITE_OTHER): Payer: Medicare Other | Admitting: Podiatry

## 2016-03-09 DIAGNOSIS — M722 Plantar fascial fibromatosis: Secondary | ICD-10-CM

## 2016-03-09 DIAGNOSIS — M7662 Achilles tendinitis, left leg: Secondary | ICD-10-CM

## 2016-03-09 NOTE — Progress Notes (Signed)
   Subjective:    Patient ID: Marcus [name], male    DOB: 1940-11-27, 75 y.o.   MRN: [id number]  HPI: He presents today with chief complaint of anteromedial pain left foot. He states it has been aching for about 2 weeks denies any trauma states that initially started hurting posteriorly and then went to the plantar fascia. He tried heel cups to no avail.    Review of Systems  HENT: Positive for hearing loss.   Endocrine: Positive for polyuria.  Genitourinary: Positive for urgency.  Musculoskeletal: Positive for gait problem.  All other systems reviewed and are negative.      Objective:   Physical Exam: Vital signs are stable he is alert and oriented 3. Pulses are palpable. Neurologic sensorium is intact. Attention reflexes are intact muscle strength is normal bilateral. With evaluation showed solid was distal to the ankle range of motion without crepitation. Cutaneous evaluation demonstrates a well-hydrated cutis no erythema edema saline as drainage or odor.    Palpation medial calcaneal tubercle Left heel.. Plantar distally oriented calcaneal spurs noted on radiograph and soft tissue crease and incision plantar fascia insertion site.  Assessment: Plantar fasciitis left. Insertional Achilles tendinitis  Plan: I injected left heel today with Kenalog and local anesthetic dispensed a plantar fascial night splint. Discussed appropriate shoe gear to exercises ice therapy shoe modifications and will follow up with him in 1 month.        Assessment & Plan:

## 2016-03-24 DIAGNOSIS — C61 Malignant neoplasm of prostate: Secondary | ICD-10-CM | POA: Diagnosis not present

## 2016-03-31 DIAGNOSIS — R3915 Urgency of urination: Secondary | ICD-10-CM | POA: Diagnosis not present

## 2016-03-31 DIAGNOSIS — C61 Malignant neoplasm of prostate: Secondary | ICD-10-CM | POA: Diagnosis not present

## 2016-03-31 DIAGNOSIS — R350 Frequency of micturition: Secondary | ICD-10-CM | POA: Diagnosis not present

## 2016-03-31 DIAGNOSIS — N401 Enlarged prostate with lower urinary tract symptoms: Secondary | ICD-10-CM | POA: Diagnosis not present

## 2016-04-08 ENCOUNTER — Ambulatory Visit: Payer: Medicare Other | Admitting: Podiatry

## 2016-04-09 ENCOUNTER — Encounter: Payer: Self-pay | Admitting: Internal Medicine

## 2016-04-09 ENCOUNTER — Encounter (INDEPENDENT_AMBULATORY_CARE_PROVIDER_SITE_OTHER): Payer: Self-pay

## 2016-04-09 ENCOUNTER — Ambulatory Visit (INDEPENDENT_AMBULATORY_CARE_PROVIDER_SITE_OTHER): Payer: Medicare Other | Admitting: Internal Medicine

## 2016-04-09 VITALS — BP 108/62 | HR 76 | Ht 67.0 in | Wt 249.5 lb

## 2016-04-09 DIAGNOSIS — K911 Postgastric surgery syndromes: Secondary | ICD-10-CM

## 2016-04-09 DIAGNOSIS — K219 Gastro-esophageal reflux disease without esophagitis: Secondary | ICD-10-CM | POA: Diagnosis not present

## 2016-04-09 DIAGNOSIS — K224 Dyskinesia of esophagus: Secondary | ICD-10-CM | POA: Diagnosis not present

## 2016-04-09 MED ORDER — METOCLOPRAMIDE HCL 10 MG PO TABS: 10.0000 mg | ORAL_TABLET | Freq: Every evening | ORAL | 1 refills | Status: DC | PRN

## 2016-04-09 NOTE — Patient Instructions (Addendum)
Diet modification discussed. Gastroparesis handout given.  Take metoclopramide before supper or bedtime as discussed. If you get your refills here you will need to be seen at least every 6 months.  Refill sent in to [facility] in [place].  Return to see Dr. Carlean Purl as needed and anually.    I appreciate the opportunity to care for you. Silvano Rusk, MD, [facility]

## 2016-04-09 NOTE — Progress Notes (Signed)
Marcus [name] 75 y.o. Dec 08, 1940 [id number]  Assessment & Plan:   Encounter Diagnoses  Name Primary?  . Gastroesophageal reflux disease, esophagitis presence not specified Yes  . Esophageal dysmotility   . Post gastrectomy syndrome     Try metaclopramide before supper or bedtime but take every night. Side effects reviewed especially tardive dyskinesia Avoid eating close to bed. RTC prn and at least annually (on metaclopramide) If he is not helped by this regimen then next step would be repeat UGI series, could need manometry, EGD again   Need to clarify medications - omeprazole and pantoprazole?? Will call   Subjective:   Chief Complaint:  HPI Episodes 2-3 x a month still regurgitates and coughs - sleeps in recliner if it happens Using metaclopramide prn and then for several days after an episode - does not seem to happen if taking metaclopramide  Allergies  Allergen Reactions  . Contrast Media [Iodinated Diagnostic Agents] Palpitations    TACHYCARDIA- patient states he has tolerated newer agents since this reaction >30 yrs ago   Outpatient Medications Prior to Visit  Medication Sig Dispense Refill  . allopurinol (ZYLOPRIM) 300 MG tablet TAKE ONE TABLET BY MOUTH ONE TIME DAILY 90 tablet 1  . amLODipine (NORVASC) 10 MG tablet Take 1 tablet (10 mg total) by mouth daily. 90 tablet 3  . Cholecalciferol (VITAMIN D) 2000 UNITS CAPS Take 1 capsule by mouth daily.    . Cyanocobalamin (VITAMIN B-12) 5000 MCG TBDP Take 1 tablet by mouth daily.     [name] Calcium (STOOL SOFTENER PO) Take 1 tablet by mouth daily.    . magnesium oxide (MAG-OX) 400 MG tablet Take 400 mg by mouth daily.    . Multiple Vitamins-Minerals (MULTIVITAMIN WITH MINERALS) tablet Take 1 tablet by mouth daily.    [name] Kitchen omeprazole (PRILOSEC) 40 MG capsule TAKE ONE CAPSULE BY MOUTH ONE TIME DAILY BEFORE MEAL 90 capsule 0  . Oxymetazoline HCl (AFRIN NASAL SPRAY NA) Place 1 Squirt into the nose at bedtime.      . pantoprazole (PROTONIX) 40 MG tablet TAKE 1 TABLET (40 MG TOTAL) BY MOUTH DAILY. TAKE 30-60 MIN BEFORE DINNER 30 tablet 2  . Potassium Gluconate 595 MG CAPS Take 1 capsule by mouth daily.    . simvastatin (ZOCOR) 20 MG tablet TAKE 1 TABLET BY MOUTH AT BEDTIME. 90 tablet 0  . tamsulosin (FLOMAX) 0.4 MG CAPS capsule     . valsartan-hydrochlorothiazide (DIOVAN-HCT) 320-25 MG tablet Take 1 tablet by mouth daily. 90 tablet 1  . metoCLOPramide (REGLAN) 10 MG tablet Take 1 tablet (10 mg total) by mouth at bedtime as needed for nausea. Try half-tablet to start - 90 tablet 0   No facility-administered medications prior to visit.    Past Medical History:  Diagnosis Date  . Acute meniscal tear of knee left  . Arthritis    generalized  . Calcaneal spur   . Coronary artery disease    dr Stanford Breed  . Diverticulosis of colon (without mention of hemorrhage)   . Echocardiogram findings abnormal, without diagnosis 04-23-2010   normal LV function, mod. left atrial enlargement, mild right artrial enlargement and grade 1 diastolic dysfunction  . Edema of lower extremity    ankles, elevates legs  . Elevated prostate specific antigen (PSA)   . Esophageal reflux   . External hemorrhoids   . Frequency of urination    followed by dr dalhstedt  . Gout   . Gout, unspecified   . Hiatal hernia   .  History of colon cancer 1988  . Hyperlipidemia   . Lumbar spondylolysis   . Normal nuclear stress test 04-23-2010   ef 51%,  septal hypokinesis, normal perfusion  . OSA (obstructive sleep apnea)    does not use CPAP   . Paraesophageal hernia    large  . Personal history of colonic polyps 08/21/2009   TUBULAR ADENOMAS  . Pneumonia    history of; last episode 2015  . Postgastric surgery syndromes   . Prostate cancer ([facility]) 2016  . Unspecified essential hypertension   . Unspecified vitamin D deficiency   . Ventral hernia   . Vitamin B12 deficiency    Past Surgical History:  Procedure Laterality Date  .  CARDIAC CATHETERIZATION    . CATARACT EXTRACTION W/ INTRAOCULAR LENS  IMPLANT, BILATERAL Bilateral   . COLONOSCOPY    . ESOPHAGOGASTRODUODENOSCOPY    . KNEE ARTHROSCOPY Right 2007  . KNEE ARTHROSCOPY  07/13/2011   Procedure: ARTHROSCOPY KNEE;  Surgeon: Cynda Familia;  Location: [facility];  Service: Orthopedics;  Laterality: Left;  partial menisectomy with chondrylplasty  . KNEE SURGERY Left   . LAMINECTOMY AND MICRODISCECTOMY LUMBAR SPINE  MARCH  2008   L3 -  4  . LAPAROSCOPIC INCISIONAL / UMBILICAL / VENTRAL HERNIA REPAIR  2006  . PROSTATE BIOPSY    . SHOULDER ARTHROSCOPY Right 05-23-2007  . SIGMOID COLECTOMY FOR CANCER  1989  . TOTAL KNEE ARTHROPLASTY Left 05/23/2015   Procedure: LEFT TOTAL KNEE ARTHROPLASTY;  Surgeon: Sydnee Cabal, MD;  Location[facility];  Service: Orthopedics;  Laterality: Left;  [name] Kitchen VERTICAL BANDED GASTROPLASTY  1986   Social History   Social History  . Marital status: Married    Spouse name: N/A  . Number of children: 4  . Years of education: N/A   Occupational History  . retired [employer] Topics  . Smoking status: Former Smoker    Packs/day: 2.00    Years: 20.00    Types: Cigarettes    Quit date: 07/05/1974  . Smokeless tobacco: Never Used  . Alcohol use 0.0 oz/week     Comment: socially; also 1-2 glasses of wine or beer every other day   . Drug use: No  . Sexual activity: Not Asked   Other Topics Concern  . None   Social History Narrative   Married   Former Smoker    Alcohol use-yes 1-2 drinks per day      Occupation: Retired Midwife      Originally from [place], [place] - in [place] > 10 yrs as of 2017         Family History  Problem Relation Age of Onset  . Stroke Paternal Grandfather   . Heart disease Paternal Grandfather   . Esophagitis Father     died from perforated esophagus  . Breast cancer Mother   . Colon cancer Maternal Grandmother     questionable        Review of  Systems About to travel to [facility] for [facility]  Objective:   Physical Exam BP 108/62 (BP Location: Left Arm, Patient Position: Sitting, Cuff Size: Normal)   Pulse 76   Ht 5\' 7"  (1.702 m)   Wt 249 lb 8 oz (113.2 kg)   BMI 39.08 kg/m  NAD obese   15 minutes time spent with patient > half in counseling coordination of care

## 2016-04-27 NOTE — Progress Notes (Signed)
HPI: FU chest pain. Patient apparently with long history of atypical chest pain. Previous cardiac catheterization normal in [place] by his report. Myoview in Oct of 2011 showed an ejection fraction of 51%, septal hypokinesis but normal perfusion. Echocardiogram in October of 2011 showed normal LV function, moderate left atrial enlargement, mild right atrial enlargement and grade 1 diastolic dysfunction. Nuclear study in April of 2014 showed ejection fraction 59%. There was a small scar in the inferior wall but no ischemia. Since last seen, the patient denies any dyspnea on exertion, orthopnea, PND, pedal edema, palpitations, syncope or chest pain.   Current Outpatient Prescriptions  Medication Sig Dispense Refill  . allopurinol (ZYLOPRIM) 300 MG tablet TAKE ONE TABLET BY MOUTH ONE TIME DAILY 90 tablet 1  . amLODipine (NORVASC) 10 MG tablet Take 1 tablet (10 mg total) by mouth daily. 90 tablet 3  . Cholecalciferol (VITAMIN D) 2000 UNITS CAPS Take 1 capsule by mouth daily.    . Cyanocobalamin (VITAMIN B-12) 5000 MCG TBDP Take 1 tablet by mouth daily.     [name] Calcium (STOOL SOFTENER PO) Take 1 tablet by mouth daily.    . magnesium oxide (MAG-OX) 400 MG tablet Take 400 mg by mouth daily.    . metoCLOPramide (REGLAN) 10 MG tablet Take 1 tablet (10 mg total) by mouth at bedtime as needed for nausea. Try half-tablet to start - 90 tablet 1  . Multiple Vitamins-Minerals (MULTIVITAMIN WITH MINERALS) tablet Take 1 tablet by mouth daily.    [name] Kitchen omeprazole (PRILOSEC) 40 MG capsule TAKE ONE CAPSULE BY MOUTH ONE TIME DAILY BEFORE MEAL 90 capsule 0  . Oxymetazoline HCl (AFRIN NASAL SPRAY NA) Place 1 Squirt into the nose at bedtime.    . pantoprazole (PROTONIX) 40 MG tablet TAKE 1 TABLET (40 MG TOTAL) BY MOUTH DAILY. TAKE 30-60 MIN BEFORE DINNER 30 tablet 2  . Potassium Gluconate 595 MG CAPS Take 1 capsule by mouth daily.    . simvastatin (ZOCOR) 20 MG tablet TAKE 1 TABLET BY MOUTH AT BEDTIME. 90  tablet 0  . tamsulosin (FLOMAX) 0.4 MG CAPS capsule     . valsartan-hydrochlorothiazide (DIOVAN-HCT) 320-25 MG tablet Take 1 tablet by mouth daily. 90 tablet 1   No current facility-administered medications for this visit.      Past Medical History:  Diagnosis Date  . Acute meniscal tear of knee left  . Arthritis    generalized  . Calcaneal spur   . Coronary artery disease    dr Stanford Breed  . Diverticulosis of colon (without mention of hemorrhage)   . Echocardiogram findings abnormal, without diagnosis 04-23-2010   normal LV function, mod. left atrial enlargement, mild right artrial enlargement and grade 1 diastolic dysfunction  . Edema of lower extremity    ankles, elevates legs  . Elevated prostate specific antigen (PSA)   . Esophageal reflux   . External hemorrhoids   . Frequency of urination    followed by dr dalhstedt  . Gout   . Gout, unspecified   . Hiatal hernia   . History of colon cancer 1988  . Hyperlipidemia   . Lumbar spondylolysis   . Normal nuclear stress test 04-23-2010   ef 51%,  septal hypokinesis, normal perfusion  . OSA (obstructive sleep apnea)    does not use CPAP   . Paraesophageal hernia    large  . Personal history of colonic polyps 08/21/2009   TUBULAR ADENOMAS  . Pneumonia    history of;  last episode 2015  . Postgastric surgery syndromes   . Prostate cancer ([place]) 2016  . Unspecified essential hypertension   . Unspecified vitamin D deficiency   . Ventral hernia   . Vitamin B12 deficiency     Past Surgical History:  Procedure Laterality Date  . CARDIAC CATHETERIZATION    . CATARACT EXTRACTION W/ INTRAOCULAR LENS  IMPLANT, BILATERAL Bilateral   . COLONOSCOPY    . ESOPHAGOGASTRODUODENOSCOPY    . KNEE ARTHROSCOPY Right 2007  . KNEE ARTHROSCOPY  07/13/2011   Procedure: ARTHROSCOPY KNEE;  Surgeon: Cynda Familia;  Location: [facility];  Service: Orthopedics;  Laterality: Left;  partial menisectomy with chondrylplasty  .  KNEE SURGERY Left   . LAMINECTOMY AND MICRODISCECTOMY LUMBAR SPINE  MARCH  2008   L3 -  4  . LAPAROSCOPIC INCISIONAL / UMBILICAL / VENTRAL HERNIA REPAIR  2006  . PROSTATE BIOPSY    . SHOULDER ARTHROSCOPY Right 05-23-2007  . SIGMOID COLECTOMY FOR CANCER  1989  . TOTAL KNEE ARTHROPLASTY Left 05/23/2015   Procedure: LEFT TOTAL KNEE ARTHROPLASTY;  Surgeon: Sydnee Cabal, MD;  Location: [facility];  Service: Orthopedics;  Laterality: Left;  [name] Kitchen VERTICAL BANDED GASTROPLASTY  1986    Social History   Social History  . Marital status: Married    Spouse name: N/A  . Number of children: 4  . Years of education: N/A   Occupational History  . retired [employer]  . Smoking status: Former Smoker    Packs/day: 2.00    Years: 20.00    Types: Cigarettes    Quit date: 07/05/1974  . Smokeless tobacco: Never Used  . Alcohol use 0.0 oz/week     Comment: socially; also 1-2 glasses of wine or beer every other day   . Drug use: No  . Sexual activity: Not on file   Other Topics Concern  . Not on file   Social History Narrative   Married   Former Smoker    Alcohol use-yes 1-2 drinks per day      Occupation: Retired Midwife      Originally from [place], [place] - in [place] > 10 yrs as of 2017          Family History  Problem Relation Age of Onset  . Stroke Paternal Grandfather   . Heart disease Paternal Grandfather   . Esophagitis Father     died from perforated esophagus  . Breast cancer Mother   . Colon cancer Maternal Grandmother     questionable    ROS: no fevers or chills, productive cough, hemoptysis, dysphasia, odynophagia, melena, hematochezia, dysuria, hematuria, rash, seizure activity, orthopnea, PND, pedal edema, claudication. Remaining systems are negative.  Physical Exam: Well-developed well-nourished in no acute distress.  Skin is warm and dry.  HEENT is normal.  Neck is supple.  Chest is clear to auscultation with normal expansion.    Cardiovascular exam is regular rate and rhythm.  Abdominal exam nontender or distended. No masses palpated. Positive bruit Extremities show no edema. neuro grossly intact  ECG-Sinus rhythm at a rate of 70. First degree AV block.  A/P  1 hyperlipidemia-continue statin.  2 hypertension-blood pressure controlled. Continue present medications.  3 Bruit-schedule abdominal ultrasound to exclude aneurysm.  Kirk Ruths, MD

## 2016-04-29 ENCOUNTER — Ambulatory Visit (INDEPENDENT_AMBULATORY_CARE_PROVIDER_SITE_OTHER): Payer: Medicare Other | Admitting: Cardiology

## 2016-04-29 ENCOUNTER — Encounter: Payer: Self-pay | Admitting: Cardiology

## 2016-04-29 VITALS — BP 126/74 | HR 70 | Ht 69.0 in | Wt 254.0 lb

## 2016-04-29 DIAGNOSIS — I10 Essential (primary) hypertension: Secondary | ICD-10-CM | POA: Diagnosis not present

## 2016-04-29 DIAGNOSIS — E7800 Pure hypercholesterolemia, unspecified: Secondary | ICD-10-CM

## 2016-04-29 DIAGNOSIS — R0989 Other specified symptoms and signs involving the circulatory and respiratory systems: Secondary | ICD-10-CM

## 2016-04-29 NOTE — Patient Instructions (Signed)
Medication Instructions:   NO CHANGE  Testing/Procedures:  Your physician has requested that you have an abdominal aorta duplex. During this test, an ultrasound is used to evaluate the aorta. Allow 30 minutes for this exam. Do not eat after midnight the day before and avoid carbonated beverages   Follow-Up:  Your physician wants you to follow-up in: ONE YEAR WITH DR CRENSHAW You will receive a reminder letter in the mail two months in advance. If you don't receive a letter, please call our office to schedule the follow-up appointment.   If you need a refill on your cardiac medications before your next appointment, please call your pharmacy.    

## 2016-05-07 ENCOUNTER — Other Ambulatory Visit: Payer: Self-pay | Admitting: Adult Health

## 2016-05-16 ENCOUNTER — Other Ambulatory Visit: Payer: Self-pay | Admitting: Adult Health

## 2016-05-16 DIAGNOSIS — I10 Essential (primary) hypertension: Secondary | ICD-10-CM

## 2016-05-17 ENCOUNTER — Encounter (HOSPITAL_COMMUNITY): Payer: Self-pay

## 2016-05-17 ENCOUNTER — Ambulatory Visit (HOSPITAL_COMMUNITY)
Admission: RE | Admit: 2016-05-17 | Discharge: 2016-05-17 | Disposition: A | Discharge status: Home / Self Care | Payer: Medicare Other | Source: Ambulatory Visit | Attending: Cardiovascular Disease | Admitting: Cardiovascular Disease

## 2016-05-17 DIAGNOSIS — R0989 Other specified symptoms and signs involving the circulatory and respiratory systems: Secondary | ICD-10-CM

## 2016-06-08 ENCOUNTER — Other Ambulatory Visit: Payer: Self-pay | Admitting: Adult Health

## 2016-06-11 DIAGNOSIS — N401 Enlarged prostate with lower urinary tract symptoms: Secondary | ICD-10-CM | POA: Diagnosis not present

## 2016-06-11 DIAGNOSIS — R350 Frequency of micturition: Secondary | ICD-10-CM | POA: Diagnosis not present

## 2016-06-23 ENCOUNTER — Encounter: Payer: Self-pay | Admitting: Adult Health

## 2016-06-23 ENCOUNTER — Ambulatory Visit (INDEPENDENT_AMBULATORY_CARE_PROVIDER_SITE_OTHER): Payer: Medicare Other | Admitting: Adult Health

## 2016-06-23 VITALS — BP 132/64 | Temp 98.0°F | Ht 69.0 in | Wt 257.2 lb

## 2016-06-23 DIAGNOSIS — J069 Acute upper respiratory infection, unspecified: Secondary | ICD-10-CM

## 2016-06-23 MED ORDER — DOXYCYCLINE HYCLATE 100 MG PO CAPS: 100.0000 mg | ORAL_CAPSULE | Freq: Two times a day (BID) | ORAL | 0 refills | Status: DC

## 2016-06-23 NOTE — Progress Notes (Signed)
Subjective:    Patient ID: Marcus [name], male    DOB: Oct 25, 1940, 75 y.o.   MRN: [id number]  URI   This is a new problem. The current episode started in the past 7 days. The problem has been unchanged. There has been no fever. Associated symptoms include congestion, coughing (productive ) and rhinorrhea. Pertinent negatives include no diarrhea, ear pain, headaches, nausea, rash or sinus pain. Treatments tried: Flonase. The treatment provided mild relief.    Review of Systems  HENT: Positive for congestion and rhinorrhea. Negative for ear pain and sinus pain.   Respiratory: Positive for cough (productive ).   Gastrointestinal: Negative for diarrhea and nausea.  Skin: Negative for rash.  Neurological: Negative for headaches.   Past Medical History:  Diagnosis Date  . Acute meniscal tear of knee left  . Arthritis    generalized  . Calcaneal spur   . Coronary artery disease    dr Stanford Breed  . Diverticulosis of colon (without mention of hemorrhage)   . Echocardiogram findings abnormal, without diagnosis 04-23-2010   normal LV function, mod. left atrial enlargement, mild right artrial enlargement and grade 1 diastolic dysfunction  . Edema of lower extremity    ankles, elevates legs  . Elevated prostate specific antigen (PSA)   . Esophageal reflux   . External hemorrhoids   . Frequency of urination    followed by dr dalhstedt  . Gout   . Gout, unspecified   . Hiatal hernia   . History of colon cancer 1988  . Hyperlipidemia   . Lumbar spondylolysis   . Normal nuclear stress test 04-23-2010   ef 51%,  septal hypokinesis, normal perfusion  . OSA (obstructive sleep apnea)    does not use CPAP   . Paraesophageal hernia    large  . Personal history of colonic polyps 08/21/2009   TUBULAR ADENOMAS  . Pneumonia    history of; last episode 2015  . Postgastric surgery syndromes   . Prostate cancer ([place]) 2016  . Unspecified essential hypertension   . Unspecified vitamin D  deficiency   . Ventral hernia   . Vitamin B12 deficiency     Social History   Social History  . Marital status: Married    Spouse name: N/A  . Number of children: 4  . Years of education: N/A   Occupational History  . retired [employer] Topics  . Smoking status: Former Smoker    Packs/day: 2.00    Years: 20.00    Types: Cigarettes    Quit date: 07/05/1974  . Smokeless tobacco: Never Used  . Alcohol use 0.0 oz/week     Comment: socially; also 1-2 glasses of wine or beer every other day   . Drug use: No  . Sexual activity: Not on file   Other Topics Concern  . Not on file   Social History Narrative   Married   Former Smoker    Alcohol use-yes 1-2 drinks per day      Occupation: Retired Midwife      Originally from [place], [place] - in [place] > 10 yrs as of 2017          Past Surgical History:  Procedure Laterality Date  . CARDIAC CATHETERIZATION    . CATARACT EXTRACTION W/ INTRAOCULAR LENS  IMPLANT, BILATERAL Bilateral   . COLONOSCOPY    . ESOPHAGOGASTRODUODENOSCOPY    . KNEE ARTHROSCOPY Right 2007  . KNEE ARTHROSCOPY  07/13/2011  Procedure: ARTHROSCOPY KNEE;  Surgeon: Cynda Familia;  Location: [facility];  Service: Orthopedics;  Laterality: Left;  partial menisectomy with chondrylplasty  . KNEE SURGERY Left   . LAMINECTOMY AND MICRODISCECTOMY LUMBAR SPINE  MARCH  2008   L3 -  4  . LAPAROSCOPIC INCISIONAL / UMBILICAL / VENTRAL HERNIA REPAIR  2006  . PROSTATE BIOPSY    . SHOULDER ARTHROSCOPY Right 05-23-2007  . SIGMOID COLECTOMY FOR CANCER  1989  . TOTAL KNEE ARTHROPLASTY Left 05/23/2015   Procedure: LEFT TOTAL KNEE ARTHROPLASTY;  Surgeon: Sydnee Cabal, MD;  Location: [facility];  Service: Orthopedics;  Laterality: Left;  [name] Kitchen VERTICAL BANDED GASTROPLASTY  1986    Family History  Problem Relation Age of Onset  . Stroke Paternal Grandfather   . Heart disease Paternal Grandfather   . Esophagitis Father     died from  perforated esophagus  . Breast cancer Mother   . Colon cancer Maternal Grandmother     questionable    Allergies  Allergen Reactions  . Contrast Media [Iodinated Diagnostic Agents] Palpitations    TACHYCARDIA- patient states he has tolerated newer agents since this reaction >30 yrs ago    Current Outpatient Prescriptions on File Prior to Visit  Medication Sig Dispense Refill  . allopurinol (ZYLOPRIM) 300 MG tablet TAKE ONE TABLET BY MOUTH ONE TIME DAILY 90 tablet 1  . amLODipine (NORVASC) 10 MG tablet Take 1 tablet (10 mg total) by mouth daily. 90 tablet 3  . Cholecalciferol (VITAMIN D) 2000 UNITS CAPS Take 1 capsule by mouth daily.    . Cyanocobalamin (VITAMIN B-12) 5000 MCG TBDP Take 1 tablet by mouth daily.     [name] Calcium (STOOL SOFTENER PO) Take 1 tablet by mouth daily.    . magnesium oxide (MAG-OX) 400 MG tablet Take 400 mg by mouth daily.    . metoCLOPramide (REGLAN) 10 MG tablet Take 1 tablet (10 mg total) by mouth at bedtime as needed for nausea. Try half-tablet to start - 90 tablet 1  . Multiple Vitamins-Minerals (MULTIVITAMIN WITH MINERALS) tablet Take 1 tablet by mouth daily.    [name] Kitchen omeprazole (PRILOSEC) 40 MG capsule TAKE ONE CAPSULE BY MOUTH ONE TIME DAILY BEFORE MEAL 90 capsule 0  . Oxymetazoline HCl (AFRIN NASAL SPRAY NA) Place 1 Squirt into the nose at bedtime.    . pantoprazole (PROTONIX) 40 MG tablet TAKE 1 TABLET (40 MG TOTAL) BY MOUTH DAILY. TAKE 30-60 MIN BEFORE DINNER 30 tablet 2  . Potassium Gluconate 595 MG CAPS Take 1 capsule by mouth daily.    . simvastatin (ZOCOR) 20 MG tablet TAKE 1 TABLET BY MOUTH AT BEDTIME. 90 tablet 0  . tamsulosin (FLOMAX) 0.4 MG CAPS capsule     . valsartan-hydrochlorothiazide (DIOVAN-HCT) 320-25 MG tablet TAKE 1 TABLET BY MOUTH DAILY. 90 tablet 1   No current facility-administered medications on file prior to visit.     BP 132/64   Temp 98 F (36.7 C) (Oral)   Ht 5\' 9"  (1.753 m)   Wt 257 lb 3.2 oz (116.7 kg)   BMI 37.98  kg/m        Objective:   Physical Exam  Constitutional: He is oriented to person, place, and time. He appears well-developed and well-nourished. No distress.  HENT:  Right Ear: Hearing, tympanic membrane, external ear and ear canal normal.  Left Ear: Hearing, tympanic membrane, external ear and ear canal normal.  Nose: Mucosal edema and rhinorrhea present. Right sinus exhibits frontal sinus tenderness.  Right sinus exhibits no maxillary sinus tenderness. Left sinus exhibits no maxillary sinus tenderness and no frontal sinus tenderness.  Mouth/Throat: Uvula is midline and mucous membranes are normal. Oropharyngeal exudate present.  Cardiovascular: Normal rate, regular rhythm, normal heart sounds and intact distal pulses.  Exam reveals no gallop and no friction rub.   No murmur heard. Pulmonary/Chest: Effort normal and breath sounds normal. No respiratory distress. He has no wheezes. He has no rales. He exhibits no tenderness.  Neurological: He is alert and oriented to person, place, and time.  Skin: Skin is warm and dry. No rash noted. He is not diaphoretic. No erythema. No pallor.  Psychiatric: He has a normal mood and affect. His behavior is normal. Judgment and thought content normal.  Nursing note and vitals reviewed.     Assessment & Plan:  1. Upper respiratory tract infection, unspecified type - Advised to wait 24 hours before starting antibiotics. If he is feeling improved tomorrow then he does not need to use the antibiotics  - doxycycline (VIBRAMYCIN) 100 MG capsule; Take 1 capsule (100 mg total) by mouth 2 (two) times daily.  Dispense: 14 capsule; Refill: 0 - Continue with Flonase - Can use Mucinex   Dorothyann Peng, NP

## 2016-06-29 ENCOUNTER — Telehealth: Payer: Self-pay | Admitting: Cardiology

## 2016-06-29 NOTE — Telephone Encounter (Signed)
Spoke with pt, abdominal aorta cx. He will call back to reschedule.

## 2016-06-29 NOTE — Telephone Encounter (Signed)
Pt is scheduled for Aorta doppler tomorrow. He says he is throwing up,over heated and cold at time,wek. His question is,should he come or should he reschedule.Missy was busy testing,so I would check with the nurse and that way she would find out.

## 2016-06-30 ENCOUNTER — Inpatient Hospital Stay (HOSPITAL_COMMUNITY): Admission: RE | Admit: 2016-06-30 | Payer: Medicare Other | Source: Ambulatory Visit

## 2016-07-12 ENCOUNTER — Other Ambulatory Visit: Payer: Self-pay | Admitting: Adult Health

## 2016-07-14 ENCOUNTER — Ambulatory Visit (HOSPITAL_COMMUNITY)
Admission: RE | Admit: 2016-07-14 | Discharge: 2016-07-14 | Disposition: A | Discharge status: Home / Self Care | Payer: Medicare Other | Source: Ambulatory Visit | Attending: Cardiovascular Disease | Admitting: Cardiovascular Disease

## 2016-07-14 DIAGNOSIS — Z136 Encounter for screening for cardiovascular disorders: Secondary | ICD-10-CM | POA: Diagnosis not present

## 2016-07-14 DIAGNOSIS — R0989 Other specified symptoms and signs involving the circulatory and respiratory systems: Secondary | ICD-10-CM | POA: Diagnosis not present

## 2016-07-28 ENCOUNTER — Encounter (HOSPITAL_COMMUNITY): Payer: Medicare Other

## 2016-08-05 ENCOUNTER — Telehealth: Payer: Self-pay | Admitting: Internal Medicine

## 2016-08-05 NOTE — Telephone Encounter (Signed)
appt has been rescheduled to 08/09/16 10:30.  He is notified of the appt.

## 2016-08-09 ENCOUNTER — Ambulatory Visit (INDEPENDENT_AMBULATORY_CARE_PROVIDER_SITE_OTHER): Payer: Medicare Other | Admitting: Internal Medicine

## 2016-08-09 ENCOUNTER — Encounter: Payer: Self-pay | Admitting: Internal Medicine

## 2016-08-09 VITALS — BP 136/62 | HR 56 | Ht 67.0 in | Wt 261.1 lb

## 2016-08-09 DIAGNOSIS — IMO0001 Reserved for inherently not codable concepts without codable children: Secondary | ICD-10-CM

## 2016-08-09 DIAGNOSIS — K219 Gastro-esophageal reflux disease without esophagitis: Secondary | ICD-10-CM | POA: Diagnosis not present

## 2016-08-09 DIAGNOSIS — R1110 Vomiting, unspecified: Secondary | ICD-10-CM | POA: Diagnosis not present

## 2016-08-09 DIAGNOSIS — Z9884 Bariatric surgery status: Secondary | ICD-10-CM

## 2016-08-09 NOTE — Patient Instructions (Signed)
   You have been scheduled for an Upper GI Series at [facility]. Your appointment is on 08/12/16 at 9:30AM. Please arrive 15 minutes prior to your test for registration. Make sure not to eat or drink anything after midnight on the night before your test. If you need to reschedule, please call radiology at [phone]. ________________________________________________________________ An upper GI series uses x rays to help diagnose problems of the upper GI tract, which includes the esophagus, stomach, and duodenum. The duodenum is the first part of the small intestine. An upper GI series is conducted by a radiology technologist or a radiologist-a doctor who specializes in x-ray imaging-at a hospital or outpatient center. While sitting or standing in front of an x-ray machine, the patient drinks barium liquid, which is often white and has a chalky consistency and taste. The barium liquid coats the lining of the upper GI tract and makes signs of disease show up more clearly on x rays. X-ray video, called fluoroscopy, is used to view the barium liquid moving through the esophagus, stomach, and duodenum. Additional x rays and fluoroscopy are performed while the patient lies on an x-ray table. To fully coat the upper GI tract with barium liquid, the technologist or radiologist may press on the abdomen or ask the patient to change position. Patients hold still in various positions, allowing the technologist or radiologist to take x rays of the upper GI tract at different angles. If a technologist conducts the upper GI series, a radiologist will later examine the images to look for problems.  This test typically takes about 1 hour to complete. __________________________________________________________________   I appreciate the opportunity to care for you. Silvano Rusk, MD, [facility]

## 2016-08-09 NOTE — Progress Notes (Signed)
   Marcus [name] 76 y.o. April 02, 1941 [id number]  Assessment & Plan:   Encounter Diagnoses  Name Primary?  . Regurgitation Yes  . Gastroesophageal reflux disease, esophagitis presence not specified   . S/P bariatric surgery    His symptoms appear to be worsening. I must assume that his anatomic differences postoperatively of the cause of his problems. I will recheck with another upper GI series to see if that is changed, which could indicate a need for surgical correction which could be difficult. Weight loss might help. He might need another endoscopy to look for the possibility of a bezoar which he has had in the past. I tried to reassure him as best I could today, [place] might not be anything we can do about this given age previous surgery etc. He may need to modify his diet more. Getting a hospital bed or a wedge may be necessary area  I also still need to clarify if he is on omeprazole and pantoprazole.  CC: Dorothyann Peng, NP    Subjective:   Chief Complaint: Reflux worsening  HPI Patient is here with his wife, he is concerned because he is started having symptoms of reflux and regurgitation in the daytime. Previously had a generally been a nocturnal thing when he rolled onto was belly perhaps. He cannot remember his metoclopramide has made a difference he is use that as needed. Sounds like may be not when he used it. He is concerned about the symptoms occurring in the day and also sort of a chronic cough productive of phlegm, though no but he has told limits related to reflux he just wonders if it could be part of this problem. Note that in the past he has had a workup by Dr. Sharlett Iles in 2014 where he had hyperplastic gastric polyps, and question of a paraesophageal hiatal hernia but a subsequent upper GI series demonstrated postoperative anatomic changes consistent with his prior bariatric surgery and hernia repairs etc. but no hiatal hernia. He did have esophageal dysmotility  suggested by the barium study as well. Though a 13 mm tablet passed without difficulty.  Wt Readings from Last 3 Encounters:  08/09/16 261 lb 2 oz (118.4 kg)  06/23/16 257 lb 3.2 oz (116.7 kg)  04/29/16 254 lb (115.2 kg)   Medications, allergies, past medical history, past surgical history, family history and social history are reviewed and updated in the EMR.  Review of Systems   Objective:   Physical Exam BP 136/62 (BP Location: Left Arm, Patient Position: Sitting, Cuff Size: Normal)   Pulse (!) 56   Ht 5\' 7"  (1.702 m)   Wt 261 lb 2 oz (118.4 kg)   BMI 40.90 kg/m  Eyes anicteric Abdomen is obese He is alert and oriented 3 Appropriate mood and affect

## 2016-08-12 ENCOUNTER — Ambulatory Visit (HOSPITAL_COMMUNITY): Payer: Medicare Other

## 2016-08-13 ENCOUNTER — Other Ambulatory Visit: Payer: Self-pay | Admitting: Internal Medicine

## 2016-08-13 ENCOUNTER — Ambulatory Visit (HOSPITAL_COMMUNITY)
Admission: RE | Admit: 2016-08-13 | Discharge: 2016-08-13 | Disposition: A | Discharge status: Home / Self Care | Payer: Medicare Other | Source: Ambulatory Visit | Attending: Internal Medicine | Admitting: Internal Medicine

## 2016-08-13 DIAGNOSIS — K449 Diaphragmatic hernia without obstruction or gangrene: Secondary | ICD-10-CM | POA: Insufficient documentation

## 2016-08-13 DIAGNOSIS — IMO0001 Reserved for inherently not codable concepts without codable children: Secondary | ICD-10-CM

## 2016-08-13 DIAGNOSIS — K222 Esophageal obstruction: Secondary | ICD-10-CM | POA: Insufficient documentation

## 2016-08-13 DIAGNOSIS — K314 Gastric diverticulum: Secondary | ICD-10-CM | POA: Insufficient documentation

## 2016-08-13 DIAGNOSIS — K219 Gastro-esophageal reflux disease without esophagitis: Secondary | ICD-10-CM | POA: Diagnosis not present

## 2016-08-13 DIAGNOSIS — Z9884 Bariatric surgery status: Secondary | ICD-10-CM

## 2016-08-13 DIAGNOSIS — R1110 Vomiting, unspecified: Principal | ICD-10-CM

## 2016-08-13 NOTE — Progress Notes (Signed)
There appears to be a new stricture in the esophagus  Let's do an EGD, possible dilation in [place]

## 2016-08-14 ENCOUNTER — Other Ambulatory Visit: Payer: Self-pay | Admitting: Adult Health

## 2016-08-16 NOTE — Telephone Encounter (Signed)
Ok to refill for one year  

## 2016-08-20 ENCOUNTER — Ambulatory Visit (AMBULATORY_SURGERY_CENTER): Payer: Self-pay | Admitting: *Deleted

## 2016-08-20 DIAGNOSIS — K219 Gastro-esophageal reflux disease without esophagitis: Secondary | ICD-10-CM

## 2016-08-20 NOTE — Progress Notes (Signed)
Pt denies allergies to eggs or soy products. Denies difficulty with sedation or anesthesia. Denies any diet or weight loss medications. Denies use of supplemental oxygen.  Patient refuses video.

## 2016-08-23 ENCOUNTER — Encounter: Payer: Self-pay | Admitting: Internal Medicine

## 2016-08-23 ENCOUNTER — Ambulatory Visit (AMBULATORY_SURGERY_CENTER): Payer: Medicare Other | Admitting: Internal Medicine

## 2016-08-23 VITALS — BP 128/81 | HR 65 | Temp 98.0°F | Resp 18 | Ht 68.5 in | Wt 255.0 lb

## 2016-08-23 DIAGNOSIS — K312 Hourglass stricture and stenosis of stomach: Secondary | ICD-10-CM

## 2016-08-23 DIAGNOSIS — K317 Polyp of stomach and duodenum: Secondary | ICD-10-CM | POA: Diagnosis not present

## 2016-08-23 DIAGNOSIS — K219 Gastro-esophageal reflux disease without esophagitis: Secondary | ICD-10-CM | POA: Diagnosis present

## 2016-08-23 DIAGNOSIS — K3189 Other diseases of stomach and duodenum: Secondary | ICD-10-CM

## 2016-08-23 MED ORDER — SODIUM CHLORIDE 0.9 % IV SOLN: 500.0000 mL | INTRAVENOUS | Status: DC

## 2016-08-23 NOTE — Progress Notes (Signed)
Pt c/o left sided throat pain.  Coughing up large amt of phlegm.  Will let Dr. Carlean Purl know when he comes to speak with pt in [place]

## 2016-08-23 NOTE — Progress Notes (Signed)
Called to room to assist during endoscopic procedure.  Patient ID and intended procedure confirmed with present staff. Received instructions for my participation in the procedure from the performing physician.  

## 2016-08-23 NOTE — Progress Notes (Signed)
Pt's states no medical or surgical changes since previsit or office visit. 

## 2016-08-23 NOTE — Progress Notes (Signed)
A/ox3, pleased with MAC, report to RN 

## 2016-08-23 NOTE — Op Note (Signed)
[facility] Patient Name: Marcus [name] Procedure Date: 08/23/2016 3:44 PM MRN: [id number] Endoscopist: Gatha Mayer , MD Age: 76 Referring MD:  Date of Birth: 1940/09/22 Gender: Male Account #: [id number] Procedure:                Upper GI endoscopy Indications:              Esophageal reflux symptoms that persist despite                            appropriate therapy, Stenosis of the stomach Medicines:                Propofol per Anesthesia, Monitored Anesthesia Care Procedure:                Pre-Anesthesia Assessment:                           - Prior to the procedure, a History and Physical                            was performed, and patient medications and                            allergies were reviewed. The patient's tolerance of                            previous anesthesia was also reviewed. The risks                            and benefits of the procedure and the sedation                            options and risks were discussed with the patient.                            All questions were answered, and informed consent                            was obtained. Prior Anticoagulants: The patient has                            taken no previous anticoagulant or antiplatelet                            agents. ASA Grade Assessment: III - A patient with                            severe systemic disease. After reviewing the risks                            and benefits, the patient was deemed in                            satisfactory condition to undergo the procedure.  After obtaining informed consent, the endoscope was                            passed under direct vision. Throughout the                            procedure, the patient's blood pressure, pulse, and                            oxygen saturations were monitored continuously. The                            Model GIF-HQ190 [id number]) scope was introduced                through the mouth, and advanced to the second part                            of duodenum. The upper GI endoscopy was                            accomplished without difficulty. The patient                            tolerated the procedure well. Scope In: Scope Out: Findings:                 An extrinsic moderate stenosis was found in the                            gastric body. This was traversed. A TTS dilator was                            passed through the scope. Dilation with an 18-19-20                            mm balloon dilator was performed to 20 mm.                            Estimated blood loss: none.                           One 15 mm pedunculated and sessile polyp with                            bleeding and stigmata of recent bleeding was found                            in the gastric body. The polyp was removed with a                            hot snare. Resection and retrieval were complete.                            Verification of patient identification for the  specimen was done. Estimated blood loss was                            minimal. To prevent bleeding after the polypectomy,                            two hemostatic clips were successfully placed (MR                            conditional). There was no bleeding at the end of                            the procedure.                           A small amount of food (residue) was found in the                            gastric body.                           Evidence of a stenosed gastroplasty/stapling was                            found. A gastric pouch was found containing a                            bezoar. This was traversed.                           The exam was otherwise without abnormality. Complications:            No immediate complications. Estimated Blood Loss:     Estimated blood loss was minimal. Impression:               - Gastric stenosis was found  in the gastric body.                            Dilated.                           - One gastric polyp. Resected and retrieved. Clips                            (MR conditional) were placed.                           - A small amount of food (residue) in the stomach.                           - Stenosed gastroplasty/stapling.                           - The examination was otherwise normal.                           -  ODD ANATOMY S/P BARIATRIC SURGERY - THE FUNDUS                            GAVE IMPRESSION OF A PARAESOPHAGEAL HERNIA BUT I                            THINK THAT WAS CREATED BY THE PRIOR GASTRIC                            STAPLING/BAND PROCEDURE Recommendation:           - Patient has a contact number available for                            emergencies. The signs and symptoms of potential                            delayed complications were discussed with the                            patient. Return to normal activities tomorrow.                            Written discharge instructions were provided to the                            patient.                           - Resume previous diet.                           - Continue present medications.                           - No aspirin, ibuprofen, naproxen, or other                            non-steroidal anti-inflammatory drugs for 2 weeks                            after polyp removal.                           - Await pathology results.                           - Low fiber diet.                           - Return to my office in 2 months. Gatha Mayer, MD 08/23/2016 4:46:56 PM This report has been signed electronically.

## 2016-08-23 NOTE — Patient Instructions (Addendum)
I dilated the narrow area of the stomach to see if that helps. I removed a stomach polyp that was bleeding.  I want you to go on a low-residue diet (avoid roughage). See the diet sheet I have printed for you.  Please call soon to make an appointment to see me in April.  I appreciate the opportunity to care for you. Gatha Mayer, MD, FACG  YOU HAD AN ENDOSCOPIC PROCEDURE TODAY AT [facility]:   Refer to the procedure report that was given to you for any specific questions about what was found during the examination.  If the procedure report does not answer your questions, please call your gastroenterologist to clarify.  If you requested that your care partner not be given the details of your procedure findings, then the procedure report has been included in a sealed envelope for you to review at your convenience later.  YOU SHOULD EXPECT: Some feelings of bloating in the abdomen. Passage of more gas than usual.  Walking can help get rid of the air that was put into your GI tract during the procedure and reduce the bloating.  Please Note:  You might notice some irritation and congestion in your nose or some drainage.  This is from the oxygen used during your procedure.  There is no need for concern and it should clear up in a day or so.  SYMPTOMS TO REPORT IMMEDIATELY:    Following upper endoscopy (EGD)  Vomiting of blood or coffee ground material  New chest pain or pain under the shoulder blades  Painful or persistently difficult swallowing  New shortness of breath  Fever of 100F or higher  Black, tarry-looking stools  For urgent or emergent issues, a gastroenterologist can be reached at any hour by calling [phone].   DIET:  We recommend starting with a small meal, but may return to normal diet.  Drink plenty of fluids but you should avoid alcoholic beverages for 24 hours.  ACTIVITY:  You should plan to take it easy for the rest of today and you  should NOT DRIVE or use heavy machinery until tomorrow (because of the sedation medicines used during the test).    FOLLOW UP: Our staff will call the number listed on your records the next business day following your procedure to check on you and address any questions or concerns that you may have regarding the information given to you following your procedure. If we do not reach you, we will leave a message.  However, if you are feeling well and you are not experiencing any problems, there is no need to return our call.  We will assume that you have returned to your regular daily activities without incident.  If any biopsies were taken you will be contacted by phone or by letter within the next 1-3 weeks.  Please call us at [phone] if you have not heard about the biopsies in 3 weeks.   SIGNATURES/CONFIDENTIALITY: You and/or your care partner have signed paperwork which will be entered into your electronic medical record.  These signatures attest to the fact that that the information above on your After Visit Summary has been reviewed and is understood.  Full responsibility of the confidentiality of this discharge information lies with you and/or your care-partner.  Please do not take Aspirin, Ibuprofen, Naproxen, or NSAIDs (Advil, Aleve, Motrin) for 2 weeks.  Tylenol is ok to take  Follow low fiber diet- see handout  Continue your normal  medicaitons  Please call office in the next few days to set up a follow up appointment for April

## 2016-08-23 NOTE — Progress Notes (Signed)
When Dr. Carlean Purl came to see pt before DC, pt c/o left shoulder pain.  States, "I think I did it adjusting my pillow before I came in."  Dr. Carlean Purl made aware of that this happened in admitting in [facility].  Told to tell pt to take Tylenol as needed

## 2016-08-24 ENCOUNTER — Telehealth: Payer: Self-pay | Admitting: *Deleted

## 2016-08-24 NOTE — Telephone Encounter (Signed)
  Follow up Call-  Call back number 08/23/2016  Post procedure Call Back phone  # [phone] or [phone]  Permission to leave phone message Yes  Some recent data might be hidden     Patient questions:  Do you have a fever, pain , or abdominal swelling? No. Pain Score  0 *  Have you tolerated food without any problems? Yes.    Have you been able to return to your normal activities? Yes.    Do you have any questions about your discharge instructions: Diet   No. Medications  No. Follow up visit  No.  Do you have questions or concerns about your Care? No.  Actions: * If pain score is 4 or above: No action needed, pain <4.  Pt had no further c/o about shoulder pain

## 2016-08-26 ENCOUNTER — Ambulatory Visit: Payer: Medicare Other | Admitting: Internal Medicine

## 2016-08-27 ENCOUNTER — Telehealth: Payer: Self-pay | Admitting: Internal Medicine

## 2016-08-27 NOTE — Telephone Encounter (Signed)
Hyperplastic polyp - no cancer  How is he doing after the dilation - any better?

## 2016-08-27 NOTE — Telephone Encounter (Signed)
Patient is requesting pathology results.  Please review and advise

## 2016-08-27 NOTE — Progress Notes (Signed)
Results given by phone - hyperplastic gastric polyp No recall/letter

## 2016-08-27 NOTE — Telephone Encounter (Signed)
Patient notified of the results He reports that he had a sore throat for a few days, but now it is "just fine".  Denies any dysphagia.

## 2016-09-23 ENCOUNTER — Other Ambulatory Visit: Payer: Self-pay | Admitting: Adult Health

## 2016-10-06 ENCOUNTER — Encounter (INDEPENDENT_AMBULATORY_CARE_PROVIDER_SITE_OTHER): Payer: Self-pay

## 2016-10-06 ENCOUNTER — Ambulatory Visit (INDEPENDENT_AMBULATORY_CARE_PROVIDER_SITE_OTHER): Payer: Medicare Other | Admitting: Internal Medicine

## 2016-10-06 ENCOUNTER — Encounter: Payer: Self-pay | Admitting: Internal Medicine

## 2016-10-06 VITALS — BP 132/70 | HR 64 | Ht 67.0 in | Wt 263.4 lb

## 2016-10-06 DIAGNOSIS — K219 Gastro-esophageal reflux disease without esophagitis: Secondary | ICD-10-CM

## 2016-10-06 DIAGNOSIS — G479 Sleep disorder, unspecified: Secondary | ICD-10-CM

## 2016-10-06 DIAGNOSIS — Z9884 Bariatric surgery status: Secondary | ICD-10-CM

## 2016-10-06 DIAGNOSIS — G4733 Obstructive sleep apnea (adult) (pediatric): Secondary | ICD-10-CM | POA: Diagnosis not present

## 2016-10-06 NOTE — Patient Instructions (Signed)
   As we discussed - keep doing as you are. When you are ready you may call [facility] for an appointment with a sleep medicine specialist.  [phone]  I appreciate the opportunity to care for you. Gatha Mayer, MD, [facility]

## 2016-10-06 NOTE — Progress Notes (Signed)
   Marcus [name] 76 y.o. March 04, 1941 [id number]  Assessment & Plan:   1. Gastroesophageal reflux disease without esophagitis   2. S/P bariatric surgery   3. Obstructive sleep apnea   4. Sleep disturbance    I think he is doing about as well as he can with reflux he will continue current measures of avoiding eating after 7:00, elevating head of the bed or sleeping in a chair. Continue PPI and Reglan  I have recommended he pursue a sleep medicine visit regarding his sleep apnea and sleep disturbance. A phone call when he is ready.  He will see me as needed.I appreciate the opportunity to care for this patient.  CC: Marcus Peng, NP    Subjective:   Chief Complaint: Reflux  HPI The patient is a pleasant elderly white man with a history of reflux problems complicated by bariatric surgery at stomach reduction, he is here with his wife. I had previously evaluated with upper GI series and it looked to me like there was some stenosis in his surgically altered stomach pouch and I dilated that to 20 mm, also removed a hyperplastic polyp. This might help a bit perhaps not a think he has found that mostly avoiding eating after 7 PM helps the most and prevents reflux and aspiration like episodes and coughing.  He reports poor sleep, his wife says he snores a lot he does have sleep apnea he says his CPAP machine is outdated in the company that supplies that Marcus [name] to take another sleep test before they will give him another one. He has not been no sleep medicine specialists down here [place] that I'm aware of.  Medications, allergies, past medical history, past surgical history, family history and social history are reviewed and updated in the EMR.  Review of Systems As above  Objective:   Physical Exam BP 132/70 (BP Location: Left Arm, Patient Position: Sitting, Cuff Size: Normal)   Pulse 64   Ht 5\' 7"  (1.702 m)   Wt 263 lb 6 oz (119.5 kg)   BMI 41.25 kg/m  Obese elderly  white man in no acute distress  15 minutes time spent with patient > half in counseling coordination of care

## 2016-10-14 ENCOUNTER — Other Ambulatory Visit: Payer: Self-pay | Admitting: Adult Health

## 2016-11-19 ENCOUNTER — Other Ambulatory Visit: Payer: Self-pay | Admitting: Adult Health

## 2016-12-05 ENCOUNTER — Other Ambulatory Visit: Payer: Self-pay | Admitting: Adult Health

## 2016-12-05 DIAGNOSIS — I10 Essential (primary) hypertension: Secondary | ICD-10-CM

## 2017-01-15 ENCOUNTER — Other Ambulatory Visit: Payer: Self-pay | Admitting: Adult Health

## 2017-02-08 ENCOUNTER — Telehealth: Payer: Self-pay | Admitting: Adult Health

## 2017-02-08 NOTE — Telephone Encounter (Signed)
Noted.  See other telephone call for wife.

## 2017-02-08 NOTE — Telephone Encounter (Signed)
Pt is returning misty call °

## 2017-02-21 ENCOUNTER — Other Ambulatory Visit: Payer: Self-pay | Admitting: Adult Health

## 2017-02-22 NOTE — Telephone Encounter (Signed)
Denied.  Needs CPX and labs  for further refills.

## 2017-03-03 ENCOUNTER — Ambulatory Visit (INDEPENDENT_AMBULATORY_CARE_PROVIDER_SITE_OTHER): Payer: Medicare Other | Admitting: Adult Health

## 2017-03-03 ENCOUNTER — Encounter: Payer: Self-pay | Admitting: Adult Health

## 2017-03-03 VITALS — BP 130/60 | Temp 97.9°F | Ht 68.0 in | Wt 259.0 lb

## 2017-03-03 DIAGNOSIS — I10 Essential (primary) hypertension: Secondary | ICD-10-CM | POA: Diagnosis not present

## 2017-03-03 DIAGNOSIS — Z23 Encounter for immunization: Secondary | ICD-10-CM

## 2017-03-03 DIAGNOSIS — K219 Gastro-esophageal reflux disease without esophagitis: Secondary | ICD-10-CM | POA: Diagnosis not present

## 2017-03-03 DIAGNOSIS — Z0000 Encounter for general adult medical examination without abnormal findings: Secondary | ICD-10-CM

## 2017-03-03 DIAGNOSIS — E7800 Pure hypercholesterolemia, unspecified: Secondary | ICD-10-CM

## 2017-03-03 NOTE — Addendum Note (Signed)
Addended by: Miles Costain T on: 03/03/2017 11:50 AM   Modules accepted: Orders

## 2017-03-03 NOTE — Progress Notes (Signed)
Subjective:    Patient ID: Marcus [name], male    DOB: [birth year], 76 y.o.   MRN: [id number]  HPI  Patient presents for yearly preventative medicine examination. He is a pleasant 76 year old male who  has a past medical history of Acute meniscal tear of knee (left); Arthritis; Calcaneal spur; Colon cancer ([place]) (1983); Coronary artery disease; Diverticulosis of colon (without mention of hemorrhage); Echocardiogram findings abnormal, without diagnosis (04-23-2010); Edema of lower extremity; Elevated prostate specific antigen (PSA); Esophageal reflux; External hemorrhoids; Frequency of urination; Gout; Gout, unspecified; Hiatal hernia; History of colon cancer (1988); Hyperlipidemia; Lumbar spondylolysis; Normal nuclear stress test (04-23-2010); OSA (obstructive sleep apnea); Paraesophageal hernia; Personal history of colonic polyps (08/21/2009); Pneumonia; Postgastric surgery syndromes; Prostate cancer ([place]) (2016); Unspecified essential hypertension; Unspecified vitamin D deficiency; Ventral hernia; and Vitamin B12 deficiency.  All immunizations and health maintenance protocols were reviewed with the patient and needed orders were placed. He is due for Pneumovax 23 and Influenza   Appropriate screening laboratory values were ordered for the patient including screening of hyperlipidemia, renal function and hepatic function.  Medication reconciliation,  past medical history, social history, problem list and allergies were reviewed in detail with the patient  Goals were established with regard to weight loss, exercise, and  diet in compliance with medications. He stays active and tries to eat healthy but he is the caregiver of his wife.   End of life planning was discussed. He has an advanced directive and living will  He is up to date on his colonoscopy and had a endoscopy earlier this year. He participates in routine dental and vision care  He is followed by Urology, Orthopedics, Cad[facility], and  GI. He has seen all of his specialists within the year.   Over all he has no complaints today and reports " I am actually feeling pretty good."   Review of Systems  Constitutional: Negative.   HENT: Negative.   Eyes: Negative.   Respiratory: Negative.   Cardiovascular: Negative.   Gastrointestinal: Negative.   Endocrine: Negative.   Genitourinary: Negative.   Musculoskeletal: Negative.   Skin: Negative.   Allergic/Immunologic: Negative.   Neurological: Negative.   Hematological: Negative.   Psychiatric/Behavioral: Negative.   All other systems reviewed and are negative.  Past Medical History:  Diagnosis Date  . Acute meniscal tear of knee left  . Arthritis    generalized  . Calcaneal spur   . Colon cancer ([place]) 1983  . Coronary artery disease    dr Stanford Breed  . Diverticulosis of colon (without mention of hemorrhage)   . Echocardiogram findings abnormal, without diagnosis 04-23-2010   normal LV function, mod. left atrial enlargement, mild right artrial enlargement and grade 1 diastolic dysfunction  . Edema of lower extremity    ankles, elevates legs  . Elevated prostate specific antigen (PSA)   . Esophageal reflux   . External hemorrhoids   . Frequency of urination    followed by dr dalhstedt  . Gout   . Gout, unspecified   . Hiatal hernia   . History of colon cancer 1988  . Hyperlipidemia   . Lumbar spondylolysis   . Normal nuclear stress test 04-23-2010   ef 51%,  septal hypokinesis, normal perfusion  . OSA (obstructive sleep apnea)    does not use CPAP   . Paraesophageal hernia    large  . Personal history of colonic polyps 08/21/2009   TUBULAR ADENOMAS  . Pneumonia    history of; last  episode 2015  . Postgastric surgery syndromes   . Prostate cancer ([place]) 2016  . Unspecified essential hypertension   . Unspecified vitamin D deficiency   . Ventral hernia   . Vitamin B12 deficiency     Social History   Social History  . Marital status: Married     Spouse name: N/A  . Number of children: 4  . Years of education: N/A   Occupational History  . retired [employer]  . Smoking status: Former Smoker    Packs/day: 2.00    Years: 20.00    Types: Cigarettes    Quit date: 07/05/1974  . Smokeless tobacco: Never Used  . Alcohol use 0.0 oz/week     Comment: socially; also 1-2 glasses of wine or beer every other day   . Drug use: No  . Sexual activity: Not on file   Other Topics Concern  . Not on file   Social History Narrative   Married   Former Smoker    Alcohol use-yes 1-2 drinks per day      Occupation: Retired Midwife      Originally from [place], [place] - in [place] > 10 yrs as of 2017          Past Surgical History:  Procedure Laterality Date  . CARDIAC CATHETERIZATION    . CATARACT EXTRACTION W/ INTRAOCULAR LENS  IMPLANT, BILATERAL Bilateral   . COLONOSCOPY    . ESOPHAGOGASTRODUODENOSCOPY    . KNEE ARTHROSCOPY Right 2007  . KNEE ARTHROSCOPY  07/13/2011   Procedure: ARTHROSCOPY KNEE;  Surgeon: Cynda Familia;  Location: [facility];  Service: Orthopedics;  Laterality: Left;  partial menisectomy with chondrylplasty  . KNEE SURGERY Left   . LAMINECTOMY AND MICRODISCECTOMY LUMBAR SPINE  MARCH  2008   L3 -  4  . LAPAROSCOPIC INCISIONAL / UMBILICAL / VENTRAL HERNIA REPAIR  2006  . PROSTATE BIOPSY    . SHOULDER ARTHROSCOPY Right 05-23-2007  . SIGMOID COLECTOMY FOR CANCER  1989  . TOTAL KNEE ARTHROPLASTY Left 05/23/2015   Procedure: LEFT TOTAL KNEE ARTHROPLASTY;  Surgeon: Sydnee Cabal, MD;  Location: [facility];  Service: Orthopedics;  Laterality: Left;  [name] Kitchen VERTICAL BANDED GASTROPLASTY  1986    Family History  Problem Relation Age of Onset  . Stroke Paternal Grandfather   . Heart disease Paternal Grandfather   . Esophagitis Father        died from perforated esophagus  . Breast cancer Mother   . Colon cancer Maternal Grandmother        questionable    Allergies    Allergen Reactions  . Contrast Media [Iodinated Diagnostic Agents] Palpitations    TACHYCARDIA- patient states he has tolerated newer agents since this reaction >30 yrs ago    Current Outpatient Prescriptions on File Prior to Visit  Medication Sig Dispense Refill  . allopurinol (ZYLOPRIM) 300 MG tablet TAKE ONE TABLET BY MOUTH ONE TIME DAILY 90 tablet 0  . amLODipine (NORVASC) 10 MG tablet TAKE 1 TABLET (10 MG TOTAL) BY MOUTH DAILY. 90 tablet 3  . Cholecalciferol (VITAMIN D) 2000 UNITS CAPS Take 1 capsule by mouth daily.    . Cyanocobalamin (VITAMIN B-12) 5000 MCG TBDP Take 1 tablet by mouth daily.     [name] Calcium (STOOL SOFTENER PO) Take 1 tablet by mouth daily.    . magnesium oxide (MAG-OX) 400 MG tablet Take 400 mg by mouth daily.    . metoCLOPramide (  REGLAN) 10 MG tablet Take 1 tablet (10 mg total) by mouth at bedtime as needed for nausea. Try half-tablet to start - 90 tablet 1  . Multiple Vitamins-Minerals (MULTIVITAMIN WITH MINERALS) tablet Take 1 tablet by mouth daily.    [name] Kitchen omeprazole (PRILOSEC) 40 MG capsule TAKE ONE CAPSULE BY MOUTH ONE TIME DAILY BEFORE MEAL 90 capsule 0  . Oxymetazoline HCl (AFRIN NASAL SPRAY NA) Place 1 Squirt into the nose at bedtime.    . Potassium Gluconate 595 MG CAPS Take 1 capsule by mouth daily.    . simvastatin (ZOCOR) 20 MG tablet TAKE 1 TABLET BY MOUTH AT BEDTIME. 90 tablet 3  . tamsulosin (FLOMAX) 0.4 MG CAPS capsule     . valsartan-hydrochlorothiazide (DIOVAN-HCT) 320-25 MG tablet TAKE 1 TABLET BY MOUTH DAILY. 90 tablet 0   No current facility-administered medications on file prior to visit.     BP 130/60 (BP Location: Left Arm)   Temp 97.9 F (36.6 C) (Oral)   Ht 5\' 8"  (1.727 m)   Wt 259 lb (117.5 kg)   BMI 39.38 kg/m       Objective:   Physical Exam  Constitutional: He is oriented to person, place, and time. He appears well-developed and well-nourished. No distress.  Obese   HENT:  Head: Normocephalic and atraumatic.  Right  Ear: External ear normal.  Left Ear: External ear normal.  Nose: Nose normal.  Mouth/Throat: Oropharynx is clear and moist. No oropharyngeal exudate.  Eyes: Pupils are equal, round, and reactive to light. Conjunctivae and EOM are normal. Right eye exhibits no discharge. Left eye exhibits no discharge. No scleral icterus.  Neck: Normal range of motion. Neck supple. No JVD present. No tracheal deviation present. No thyromegaly present.  Cardiovascular: Normal rate, regular rhythm, normal heart sounds and intact distal pulses.  Exam reveals no gallop and no friction rub.   No murmur heard. Pulmonary/Chest: Effort normal and breath sounds normal. No stridor. No respiratory distress. He has no wheezes. He has no rales. He exhibits no tenderness.  Abdominal: Soft. Bowel sounds are normal. He exhibits no distension and no mass. There is no tenderness. There is no rebound and no guarding.  Genitourinary:  Genitourinary Comments: Done by Urology   Musculoskeletal: Normal range of motion. He exhibits no edema, tenderness or deformity.  Walks with a limping gait   Lymphadenopathy:    He has no cervical adenopathy.  Neurological: He is alert and oriented to person, place, and time. He has normal reflexes. He displays normal reflexes. No cranial nerve deficit. He exhibits normal muscle tone. Coordination normal.  Skin: Skin is warm and dry. No rash noted. He is not diaphoretic. No erythema. No pallor.  Surgical scar on abdomen and right knee   Psychiatric: He has a normal mood and affect. His behavior is normal. Judgment and thought content normal.  Nursing note and vitals reviewed.     Assessment & Plan:  1. Routine general medical examination at a health care facility - Continue to work on heart healthy diet and regular exercise  - Follow up in one year or sooner if needed - Pneumonia vaccination and flu vaccination given today  - Basic metabolic panel; Future - CBC with Differential/Platelet;  Future - Hepatic function panel; Future - Hemoglobin A1c; Future - Lipid panel; Future - TSH; Future  2. Essential hypertension - Near goal. No change in medications at this time  - Basic metabolic panel; Future - CBC with Differential/Platelet; Future - Hepatic function panel;  Future - Hemoglobin A1c; Future - Lipid panel; Future - TSH; Future  3. Gastroesophageal reflux disease without esophagitis - Continue with current therapy of Reglan and Prilosec   4. Pure hypercholesterolemia - Consider increasing statin  - Basic metabolic panel; Future - CBC with Differential/Platelet; Future - Hepatic function panel; Future - Hemoglobin A1c; Future - Lipid panel; Future - TSH; Future  Dorothyann Peng, NP

## 2017-03-03 NOTE — Patient Instructions (Signed)
It was great seeing you today   Please come back tomorrow morning for your labs and I will follow up with you once I get them back   Let me know if you need anything   Send Stanton Kidney my best!

## 2017-03-04 LAB — HEPATIC FUNCTION PANEL
ALT: 14 U/L (ref 0–53)
AST: 16 U/L (ref 0–37)
Albumin: 3.8 g/dL (ref 3.5–5.2)
Alkaline Phosphatase: 57 U/L (ref 39–117)
Bilirubin, Direct: 0.1 mg/dL (ref 0.0–0.3)
Total Bilirubin: 0.6 mg/dL (ref 0.2–1.2)
Total Protein: 6.3 g/dL (ref 6.0–8.3)

## 2017-03-04 LAB — CBC WITH DIFFERENTIAL/PLATELET
Basophils Absolute: 0 10*3/uL (ref 0.0–0.1)
Basophils Relative: 0.6 % (ref 0.0–3.0)
Eosinophils Absolute: 0.1 10*3/uL (ref 0.0–0.7)
Eosinophils Relative: 2.5 % (ref 0.0–5.0)
HCT: 39.7 % (ref 39.0–52.0)
Hemoglobin: 12.9 g/dL — ABNORMAL LOW (ref 13.0–17.0)
Lymphocytes Relative: 22.6 % (ref 12.0–46.0)
Lymphs Abs: 1.3 10*3/uL (ref 0.7–4.0)
MCHC: 32.4 g/dL (ref 30.0–36.0)
MCV: 87.8 fl (ref 78.0–100.0)
Monocytes Absolute: 0.5 10*3/uL (ref 0.1–1.0)
Monocytes Relative: 8.7 % (ref 3.0–12.0)
Neutro Abs: 3.8 10*3/uL (ref 1.4–7.7)
Neutrophils Relative %: 65.6 % (ref 43.0–77.0)
Platelets: 204 10*3/uL (ref 150.0–400.0)
RBC: 4.52 Mil/uL (ref 4.22–5.81)
RDW: 16.4 % — ABNORMAL HIGH (ref 11.5–15.5)
WBC: 5.7 10*3/uL (ref 4.0–10.5)

## 2017-03-04 LAB — LIPID PANEL
Cholesterol: 121 mg/dL (ref 0–200)
HDL: 44.3 mg/dL (ref >39.00)
LDL Cholesterol: 65 mg/dL (ref 0–99)
NonHDL: 77.18
Total CHOL/HDL Ratio: 3
Triglycerides: 62 mg/dL (ref 0.0–149.0)
VLDL: 12.4 mg/dL (ref 0.0–40.0)

## 2017-03-04 LAB — BASIC METABOLIC PANEL
BUN: 23 mg/dL (ref 6–23)
CO2: 27 mEq/L (ref 19–32)
Calcium: 9.1 mg/dL (ref 8.4–10.5)
Chloride: 104 mEq/L (ref 96–112)
Creatinine, Ser: 1.12 mg/dL (ref 0.40–1.50)
GFR: 67.53 mL/min (ref >60.00)
Glucose, Bld: 102 mg/dL — ABNORMAL HIGH (ref 70–99)
Potassium: 4.1 mEq/L (ref 3.5–5.1)
Sodium: 139 mEq/L (ref 135–145)

## 2017-03-04 LAB — TSH: TSH: 0.59 u[IU]/mL (ref 0.35–4.50)

## 2017-03-04 LAB — HEMOGLOBIN A1C: Hgb A1c MFr Bld: 6 % (ref 4.6–6.5)

## 2017-03-04 NOTE — Addendum Note (Signed)
Addended by: Tomi Likens on: 03/04/2017 08:08 AM   Modules accepted: Orders

## 2017-03-12 ENCOUNTER — Other Ambulatory Visit: Payer: Self-pay | Admitting: Adult Health

## 2017-03-12 DIAGNOSIS — I10 Essential (primary) hypertension: Secondary | ICD-10-CM

## 2017-03-14 ENCOUNTER — Other Ambulatory Visit: Payer: Self-pay | Admitting: Adult Health

## 2017-03-15 ENCOUNTER — Ambulatory Visit (INDEPENDENT_AMBULATORY_CARE_PROVIDER_SITE_OTHER): Payer: Medicare Other | Admitting: Adult Health

## 2017-03-15 ENCOUNTER — Encounter: Payer: Self-pay | Admitting: Adult Health

## 2017-03-15 VITALS — BP 126/64 | HR 57 | Temp 98.0°F | Resp 20 | Ht 68.0 in | Wt 265.0 lb

## 2017-03-15 DIAGNOSIS — R600 Localized edema: Secondary | ICD-10-CM | POA: Diagnosis not present

## 2017-03-15 DIAGNOSIS — R079 Chest pain, unspecified: Secondary | ICD-10-CM | POA: Diagnosis not present

## 2017-03-15 DIAGNOSIS — R0602 Shortness of breath: Secondary | ICD-10-CM

## 2017-03-15 MED ORDER — FUROSEMIDE 20 MG PO TABS: 20.0000 mg | ORAL_TABLET | Freq: Every day | ORAL | 0 refills | Status: DC

## 2017-03-15 NOTE — Progress Notes (Signed)
Subjective:    Patient ID: Marcus [name], male    DOB: September 25, 1940, 76 y.o.   MRN: [id number]  HPI  76 year old male who  has a past medical history of Acute meniscal tear of knee (left); Arthritis; Calcaneal spur; Colon cancer ([place]) (1983); Coronary artery disease; Diverticulosis of colon (without mention of hemorrhage); Echocardiogram findings abnormal, without diagnosis (04-23-2010); Edema of lower extremity; Elevated prostate specific antigen (PSA); Esophageal reflux; External hemorrhoids; Frequency of urination; Gout; Gout, unspecified; Hiatal hernia; History of colon cancer (1988); Hyperlipidemia; Lumbar spondylolysis; Normal nuclear stress test (04-23-2010); OSA (obstructive sleep apnea); Paraesophageal hernia; Personal history of colonic polyps (08/21/2009); Pneumonia; Postgastric surgery syndromes; Prostate cancer ([place]) (2016); Unspecified essential hypertension; Unspecified vitamin D deficiency; Ventral hernia; and Vitamin B12 deficiency.   He presents to the office today with the complaint of 2-3 days of a " dull chest pain" and shortness of breath with minimal exertion. He reports that he has been working outside in the heat, and he is eating a lot of excess salt. He has had significant weight gain over the last week and has noticed increased swelling in bilateral lower extremities.   He denies any sharp, radiating chest pain.   Wt Readings from Last 3 Encounters:  03/15/17 265 lb (120.2 kg)  03/03/17 259 lb (117.5 kg)  10/06/16 263 lb 6 oz (119.5 kg)   Review of Systems See HPI   Past Medical History:  Diagnosis Date  . Acute meniscal tear of knee left  . Arthritis    generalized  . Calcaneal spur   . Colon cancer ([place]) 1983  . Coronary artery disease    dr Stanford Breed  . Diverticulosis of colon (without mention of hemorrhage)   . Echocardiogram findings abnormal, without diagnosis 04-23-2010   normal LV function, mod. left atrial enlargement, mild right artrial enlargement  and grade 1 diastolic dysfunction  . Edema of lower extremity    ankles, elevates legs  . Elevated prostate specific antigen (PSA)   . Esophageal reflux   . External hemorrhoids   . Frequency of urination    followed by dr dalhstedt  . Gout   . Gout, unspecified   . Hiatal hernia   . History of colon cancer 1988  . Hyperlipidemia   . Lumbar spondylolysis   . Normal nuclear stress test 04-23-2010   ef 51%,  septal hypokinesis, normal perfusion  . OSA (obstructive sleep apnea)    does not use CPAP   . Paraesophageal hernia    large  . Personal history of colonic polyps 08/21/2009   TUBULAR ADENOMAS  . Pneumonia    history of; last episode 2015  . Postgastric surgery syndromes   . Prostate cancer ([place]) 2016  . Unspecified essential hypertension   . Unspecified vitamin D deficiency   . Ventral hernia   . Vitamin B12 deficiency     Social History   Social History  . Marital status: Married    Spouse name: N/A  . Number of children: 4  . Years of education: N/A   Occupational History  . retired [employer]  . Smoking status: Former Smoker    Packs/day: 2.00    Years: 20.00    Types: Cigarettes    Quit date: 07/05/1974  . Smokeless tobacco: Never Used  . Alcohol use 0.0 oz/week     Comment: socially; also 1-2 glasses of wine or beer every other day   . Drug  use: No  . Sexual activity: Not on file   Other Topics Concern  . Not on file   Social History Narrative   Married   Former Smoker    Alcohol use-yes 1-2 drinks per day      Occupation: Retired Midwife      Originally from [place], [place] - in [place] > 10 yrs as of 2017          Past Surgical History:  Procedure Laterality Date  . CARDIAC CATHETERIZATION    . CATARACT EXTRACTION W/ INTRAOCULAR LENS  IMPLANT, BILATERAL Bilateral   . COLONOSCOPY    . ESOPHAGOGASTRODUODENOSCOPY    . KNEE ARTHROSCOPY Right 2007  . KNEE ARTHROSCOPY  07/13/2011   Procedure: ARTHROSCOPY KNEE;   Surgeon: Cynda Familia;  Location: [facility];  Service: Orthopedics;  Laterality: Left;  partial menisectomy with chondrylplasty  . KNEE SURGERY Left   . LAMINECTOMY AND MICRODISCECTOMY LUMBAR SPINE  MARCH  2008   L3 -  4  . LAPAROSCOPIC INCISIONAL / UMBILICAL / VENTRAL HERNIA REPAIR  2006  . PROSTATE BIOPSY    . SHOULDER ARTHROSCOPY Right 05-23-2007  . SIGMOID COLECTOMY FOR CANCER  1989  . TOTAL KNEE ARTHROPLASTY Left 05/23/2015   Procedure: LEFT TOTAL KNEE ARTHROPLASTY;  Surgeon: Sydnee Cabal, MD;  Location: [facility];  Service: Orthopedics;  Laterality: Left;  [name] Kitchen VERTICAL BANDED GASTROPLASTY  1986    Family History  Problem Relation Age of Onset  . Stroke Paternal Grandfather   . Heart disease Paternal Grandfather   . Esophagitis Father        died from perforated esophagus  . Breast cancer Mother   . Colon cancer Maternal Grandmother        questionable    Allergies  Allergen Reactions  . Contrast Media [Iodinated Diagnostic Agents] Palpitations    TACHYCARDIA- patient states he has tolerated newer agents since this reaction >30 yrs ago    Current Outpatient Prescriptions on File Prior to Visit  Medication Sig Dispense Refill  . allopurinol (ZYLOPRIM) 300 MG tablet TAKE ONE TABLET BY MOUTH ONE TIME DAILY 90 tablet 0  . amLODipine (NORVASC) 10 MG tablet TAKE 1 TABLET (10 MG TOTAL) BY MOUTH DAILY. 90 tablet 3  . Cholecalciferol (VITAMIN D) 2000 UNITS CAPS Take 1 capsule by mouth daily.    . Cyanocobalamin (VITAMIN B-12) 5000 MCG TBDP Take 1 tablet by mouth daily.     [name] Calcium (STOOL SOFTENER PO) Take 1 tablet by mouth daily.    . magnesium oxide (MAG-OX) 400 MG tablet Take 400 mg by mouth daily.    . metoCLOPramide (REGLAN) 10 MG tablet Take 1 tablet (10 mg total) by mouth at bedtime as needed for nausea. Try half-tablet to start - 90 tablet 1  . Multiple Vitamins-Minerals (MULTIVITAMIN WITH MINERALS) tablet Take 1 tablet by mouth daily.    [name] Kitchen  omeprazole (PRILOSEC) 40 MG capsule TAKE ONE CAPSULE BY MOUTH ONE TIME DAILY BEFORE MEAL 90 capsule 0  . Oxymetazoline HCl (AFRIN NASAL SPRAY NA) Place 1 Squirt into the nose at bedtime.    . Potassium Gluconate 595 MG CAPS Take 1 capsule by mouth daily.    . simvastatin (ZOCOR) 20 MG tablet TAKE 1 TABLET BY MOUTH AT BEDTIME. 90 tablet 3  . tamsulosin (FLOMAX) 0.4 MG CAPS capsule     . valsartan-hydrochlorothiazide (DIOVAN-HCT) 320-25 MG tablet TAKE 1 TABLET BY MOUTH DAILY. 90 tablet 0   No current facility-administered medications on  file prior to visit.     BP 126/64   Pulse (!) 57   Temp 98 F (36.7 C)   Resp 20   Ht 5\' 8"  (1.727 m)   Wt 265 lb (120.2 kg)   SpO2 96%   BMI 40.29 kg/m       Objective:   Physical Exam  Constitutional: He is oriented to person, place, and time. He appears well-developed and well-nourished. No distress.  Cardiovascular: Normal rate, regular rhythm, normal heart sounds and intact distal pulses.  Exam reveals no gallop and no friction rub.   No murmur heard. Pulmonary/Chest: Effort normal and breath sounds normal. No respiratory distress. He has no wheezes. He has no rales. He exhibits no tenderness.  Musculoskeletal: He exhibits edema (+ 2 pitting edema in bilateral lower extremities ). He exhibits no tenderness or deformity.  Neurological: He is alert and oriented to person, place, and time.  Skin: Skin is warm and dry. No rash noted. He is not diaphoretic. No erythema. No pallor.  Psychiatric: He has a normal mood and affect. His behavior is normal. Judgment and thought content normal.  Nursing note and vitals reviewed.     Assessment & Plan:  Likely fluid overloaded from excessive salt intake. Will get BMP and start on Lasix 20 mg daily for the next 5 days. Advised low salt diet and to stay hydrated. EKG showed sinus bradycardia with a first degree AV block, rate 54. I would like him to follow up in one week or sooner if needed   Dorothyann Peng, NP

## 2017-03-16 LAB — BASIC METABOLIC PANEL
BUN: 24 mg/dL — ABNORMAL HIGH (ref 6–23)
CO2: 27 mEq/L (ref 19–32)
Calcium: 9.1 mg/dL (ref 8.4–10.5)
Chloride: 104 mEq/L (ref 96–112)
Creatinine, Ser: 1.31 mg/dL (ref 0.40–1.50)
GFR: 56.36 mL/min — ABNORMAL LOW (ref >60.00)
Glucose, Bld: 91 mg/dL (ref 70–99)
Potassium: 4.1 mEq/L (ref 3.5–5.1)
Sodium: 141 mEq/L (ref 135–145)

## 2017-04-19 ENCOUNTER — Telehealth: Payer: Self-pay | Admitting: Adult Health

## 2017-04-19 NOTE — Telephone Encounter (Signed)
I do not know of anyone that takes Medicare. The easiest is to call the insurance and get a list.   He can call [facility] Phone: [phone], ,Dr. Boykin Nearing to see if they take [facility]

## 2017-04-19 NOTE — Telephone Encounter (Signed)
Pt notified of below information and gave phone # to [facility].  Pt will call to see if they take his insurance.

## 2017-04-19 NOTE — Telephone Encounter (Signed)
Pt states they  Just lost their dentist and wants to know if you know anyone that is really good. He says he trust your opinion.

## 2017-04-20 ENCOUNTER — Other Ambulatory Visit: Payer: Self-pay | Admitting: Adult Health

## 2017-04-21 NOTE — Telephone Encounter (Signed)
Sent to the pharmacy by e-scribe for 6 months per protocol.

## 2017-05-06 ENCOUNTER — Ambulatory Visit (INDEPENDENT_AMBULATORY_CARE_PROVIDER_SITE_OTHER): Payer: Medicare Other | Admitting: Adult Health

## 2017-05-06 ENCOUNTER — Encounter: Payer: Self-pay | Admitting: Adult Health

## 2017-05-09 NOTE — Progress Notes (Signed)
erroneous entry 

## 2017-06-11 ENCOUNTER — Other Ambulatory Visit: Payer: Self-pay | Admitting: Adult Health

## 2017-06-13 ENCOUNTER — Other Ambulatory Visit: Payer: Self-pay | Admitting: Adult Health

## 2017-06-13 MED ORDER — ALLOPURINOL 300 MG PO TABS: 300.0000 mg | ORAL_TABLET | Freq: Every day | ORAL | 3 refills | Status: DC

## 2017-06-14 ENCOUNTER — Ambulatory Visit: Payer: Medicare Other | Admitting: Physician Assistant

## 2017-07-05 DIAGNOSIS — F4321 Adjustment disorder with depressed mood: Secondary | ICD-10-CM

## 2017-07-05 HISTORY — DX: Adjustment disorder with depressed mood: F43.21

## 2017-08-17 ENCOUNTER — Other Ambulatory Visit: Payer: Self-pay | Admitting: Adult Health

## 2017-08-17 DIAGNOSIS — I10 Essential (primary) hypertension: Secondary | ICD-10-CM

## 2017-08-18 NOTE — Telephone Encounter (Signed)
Sent to the pharmacy by e-scribe. 

## 2017-09-05 DIAGNOSIS — C61 Malignant neoplasm of prostate: Secondary | ICD-10-CM | POA: Diagnosis not present

## 2017-09-05 LAB — PSA: PSA: 7.4

## 2017-09-07 DIAGNOSIS — N400 Enlarged prostate without lower urinary tract symptoms: Secondary | ICD-10-CM | POA: Diagnosis not present

## 2017-09-07 DIAGNOSIS — C61 Malignant neoplasm of prostate: Secondary | ICD-10-CM | POA: Diagnosis not present

## 2017-09-13 ENCOUNTER — Encounter: Payer: Self-pay | Admitting: Family Medicine

## 2017-10-12 ENCOUNTER — Other Ambulatory Visit: Payer: Self-pay | Admitting: Adult Health

## 2017-10-13 NOTE — Telephone Encounter (Signed)
Sent to the pharmacy by e-scribe. 

## 2017-10-14 DIAGNOSIS — M545 Low back pain: Secondary | ICD-10-CM | POA: Diagnosis not present

## 2017-10-14 DIAGNOSIS — M51369 Other intervertebral disc degeneration, lumbar region without mention of lumbar back pain or lower extremity pain: Secondary | ICD-10-CM

## 2017-10-14 DIAGNOSIS — M48061 Spinal stenosis, lumbar region without neurogenic claudication: Secondary | ICD-10-CM | POA: Diagnosis not present

## 2017-10-14 DIAGNOSIS — M5136 Other intervertebral disc degeneration, lumbar region: Secondary | ICD-10-CM | POA: Diagnosis not present

## 2017-10-14 DIAGNOSIS — M961 Postlaminectomy syndrome, not elsewhere classified: Secondary | ICD-10-CM

## 2017-10-14 HISTORY — DX: Other intervertebral disc degeneration, lumbar region: M51.36

## 2017-10-14 HISTORY — DX: Other intervertebral disc degeneration, lumbar region without mention of lumbar back pain or lower extremity pain: M51.369

## 2017-10-14 HISTORY — DX: Postlaminectomy syndrome, not elsewhere classified: M96.1

## 2017-10-27 ENCOUNTER — Encounter: Payer: Self-pay | Admitting: Internal Medicine

## 2017-11-03 DIAGNOSIS — M5136 Other intervertebral disc degeneration, lumbar region: Secondary | ICD-10-CM | POA: Diagnosis not present

## 2017-11-09 DIAGNOSIS — M961 Postlaminectomy syndrome, not elsewhere classified: Secondary | ICD-10-CM | POA: Diagnosis not present

## 2017-11-09 DIAGNOSIS — M5136 Other intervertebral disc degeneration, lumbar region: Secondary | ICD-10-CM | POA: Diagnosis not present

## 2017-11-13 ENCOUNTER — Other Ambulatory Visit: Payer: Self-pay | Admitting: Adult Health

## 2017-11-15 NOTE — Telephone Encounter (Signed)
Sent to the pharmacy by e-scribe. 

## 2017-11-22 ENCOUNTER — Ambulatory Visit (INDEPENDENT_AMBULATORY_CARE_PROVIDER_SITE_OTHER): Payer: Medicare HMO | Admitting: Adult Health

## 2017-11-22 ENCOUNTER — Encounter: Payer: Self-pay | Admitting: Adult Health

## 2017-11-22 VITALS — BP 144/64 | Temp 98.2°F | Wt 255.0 lb

## 2017-11-22 DIAGNOSIS — F329 Major depressive disorder, single episode, unspecified: Secondary | ICD-10-CM

## 2017-11-22 MED ORDER — FLUOXETINE HCL 40 MG PO CAPS: 40.0000 mg | ORAL_CAPSULE | Freq: Every day | ORAL | 1 refills | Status: DC

## 2017-11-22 NOTE — Progress Notes (Signed)
Subjective:    Patient ID: Marcus [name], male    DOB: 10-Jun-1941, 77 y.o.   MRN: [id number]  HPI  77 year old male who  has a past medical history of Acute meniscal tear of knee (left), Arthritis, Calcaneal spur, Colon cancer ([place]) (1983), Coronary artery disease, Diverticulosis of colon (without mention of hemorrhage), Echocardiogram findings abnormal, without diagnosis (04-23-2010), Edema of lower extremity, Elevated prostate specific antigen (PSA), Esophageal reflux, External hemorrhoids, Frequency of urination, Gout, Gout, unspecified, Hiatal hernia, History of colon cancer (1988), Hyperlipidemia, Lumbar spondylolysis, Normal nuclear stress test (04-23-2010), OSA (obstructive sleep apnea), Paraesophageal hernia, Personal history of colonic polyps (08/21/2009), Pneumonia, Postgastric surgery syndromes, Prostate cancer ([place]) (2016), Unspecified essential hypertension, Unspecified vitamin D deficiency, Ventral hernia, and Vitamin B12 deficiency.  He presents to the office today talk about medication for depression. He reports that he has been suffering from depression for the last 5 years but recently he has been experiencing worsening symptoms. Contributing factors are the failing health of his wife and him being the main caregiver for her and family stressors dealing with his wife's health. He reports mood swings and has noticed that he is becoming more agiated with his family  He denies any anxiety of suicidal thoughts.   He has never been on a medication in the past for depression   Review of Systems See HPI   Past Medical History:  Diagnosis Date  . Acute meniscal tear of knee left  . Arthritis    generalized  . Calcaneal spur   . Colon cancer ([place]) 1983  . Coronary artery disease    dr Stanford Breed  . Diverticulosis of colon (without mention of hemorrhage)   . Echocardiogram findings abnormal, without diagnosis 04-23-2010   normal LV function, mod. left atrial enlargement, mild  right artrial enlargement and grade 1 diastolic dysfunction  . Edema of lower extremity    ankles, elevates legs  . Elevated prostate specific antigen (PSA)   . Esophageal reflux   . External hemorrhoids   . Frequency of urination    followed by dr dalhstedt  . Gout   . Gout, unspecified   . Hiatal hernia   . History of colon cancer 1988  . Hyperlipidemia   . Lumbar spondylolysis   . Normal nuclear stress test 04-23-2010   ef 51%,  septal hypokinesis, normal perfusion  . OSA (obstructive sleep apnea)    does not use CPAP   . Paraesophageal hernia    large  . Personal history of colonic polyps 08/21/2009   TUBULAR ADENOMAS  . Pneumonia    history of; last episode 2015  . Postgastric surgery syndromes   . Prostate cancer ([place]) 2016  . Unspecified essential hypertension   . Unspecified vitamin D deficiency   . Ventral hernia   . Vitamin B12 deficiency     Social History   Socioeconomic History  . Marital status: Married    Spouse name: Not on file  . Number of children: 4  . Years of education: Not on file  . Highest education level: Not on file  Occupational History  . Occupation: retired    Fish farm manager: [employer]  . Financial resource strain: Not on file  . Food insecurity:    Worry: Not on file    Inability: Not on file  . Transportation needs:    Medical: Not on file    Non-medical: Not on file  Tobacco Use  . Smoking status: Former  Smoker    Packs/day: 2.00    Years: 20.00    Pack years: 40.00    Types: Cigarettes    Last attempt to quit: 07/05/1974    Years since quitting: 43.4  . Smokeless tobacco: Never Used  Substance and Sexual Activity  . Alcohol use: Yes    Alcohol/week: 0.0 oz    Comment: socially; also 1-2 glasses of wine or beer every other day   . Drug use: No  . Sexual activity: Not on file  Lifestyle  . Physical activity:    Days per week: Not on file    Minutes per session: Not on file  . Stress: Not on file    Relationships  . Social connections:    Talks on phone: Not on file    Gets together: Not on file    Attends religious service: Not on file    Active member of club or organization: Not on file    Attends meetings of clubs or organizations: Not on file    Relationship status: Not on file  . Intimate partner violence:    Fear of current or ex partner: Not on file    Emotionally abused: Not on file    Physically abused: Not on file    Forced sexual activity: Not on file  Other Topics Concern  . Not on file  Social History Narrative   Married   Former Smoker    Alcohol use-yes 1-2 drinks per day      Occupation: Retired Midwife      Originally from [place], [place] - in [place] > 10 yrs as of 2017       Past Surgical History:  Procedure Laterality Date  . CARDIAC CATHETERIZATION    . CATARACT EXTRACTION W/ INTRAOCULAR LENS  IMPLANT, BILATERAL Bilateral   . COLONOSCOPY    . ESOPHAGOGASTRODUODENOSCOPY    . KNEE ARTHROSCOPY Right 2007  . KNEE ARTHROSCOPY  07/13/2011   Procedure: ARTHROSCOPY KNEE;  Surgeon: Cynda Familia;  Location: [facility];  Service: Orthopedics;  Laterality: Left;  partial menisectomy with chondrylplasty  . KNEE SURGERY Left   . LAMINECTOMY AND MICRODISCECTOMY LUMBAR SPINE  MARCH  2008   L3 -  4  . LAPAROSCOPIC INCISIONAL / UMBILICAL / VENTRAL HERNIA REPAIR  2006  . PROSTATE BIOPSY    . SHOULDER ARTHROSCOPY Right 05-23-2007  . SIGMOID COLECTOMY FOR CANCER  1989  . TOTAL KNEE ARTHROPLASTY Left 05/23/2015   Procedure: LEFT TOTAL KNEE ARTHROPLASTY;  Surgeon: Sydnee Cabal, MD;  Location: [facility];  Service: Orthopedics;  Laterality: Left;  [name] Kitchen VERTICAL BANDED GASTROPLASTY  1986    Family History  Problem Relation Age of Onset  . Stroke Paternal Grandfather   . Heart disease Paternal Grandfather   . Esophagitis Father        died from perforated esophagus  . Breast cancer Mother   . Colon cancer Maternal Grandmother        questionable     Allergies  Allergen Reactions  . Contrast Media [Iodinated Diagnostic Agents] Palpitations    TACHYCARDIA- patient states he has tolerated newer agents since this reaction >30 yrs ago    Current Outpatient Medications on File Prior to Visit  Medication Sig Dispense Refill  . allopurinol (ZYLOPRIM) 300 MG tablet Take 1 tablet (300 mg total) by mouth daily. 90 tablet 3  . amLODipine (NORVASC) 10 MG tablet TAKE 1 TABLET (10 MG TOTAL) BY MOUTH DAILY. 90 tablet 0  .  Cholecalciferol (VITAMIN D) 2000 UNITS CAPS Take 1 capsule by mouth daily.    . Cyanocobalamin (VITAMIN B-12) 5000 MCG TBDP Take 1 tablet by mouth daily.     [name] Calcium (STOOL SOFTENER PO) Take 1 tablet by mouth daily.    . furosemide (LASIX) 20 MG tablet Take 1 tablet (20 mg total) by mouth daily. 10 tablet 0  . magnesium oxide (MAG-OX) 400 MG tablet Take 400 mg by mouth daily.    . metoCLOPramide (REGLAN) 10 MG tablet Take 1 tablet (10 mg total) by mouth at bedtime as needed for nausea. Try half-tablet to start - 90 tablet 1  . Multiple Vitamins-Minerals (MULTIVITAMIN WITH MINERALS) tablet Take 1 tablet by mouth daily.    [name] Kitchen omeprazole (PRILOSEC) 40 MG capsule TAKE ONE CAPSULE BY MOUTH ONE TIME DAILY BEFORE MEAL 90 capsule 1  . Oxymetazoline HCl (AFRIN NASAL SPRAY NA) Place 1 Squirt into the nose at bedtime.    . Potassium Gluconate 595 MG CAPS Take 1 capsule by mouth daily.    . simvastatin (ZOCOR) 20 MG tablet TAKE 1 TABLET BY MOUTH AT BEDTIME. 90 tablet 3  . tamsulosin (FLOMAX) 0.4 MG CAPS capsule     . valsartan-hydrochlorothiazide (DIOVAN-HCT) 320-25 MG tablet TAKE 1 TABLET BY MOUTH DAILY. 90 tablet 1   No current facility-administered medications on file prior to visit.     BP (!) 144/64   Temp 98.2 F (36.8 C) (Oral)   Wt 255 lb (115.7 kg)   BMI 38.77 kg/m       Objective:   Physical Exam  Constitutional: He is oriented to person, place, and time. He appears well-developed and well-nourished. No  distress.  Cardiovascular: Normal rate, regular rhythm, normal heart sounds and intact distal pulses. Exam reveals no gallop and no friction rub.  No murmur heard. Pulmonary/Chest: Effort normal and breath sounds normal. No stridor. No respiratory distress. He has no wheezes. He has no rales. He exhibits no tenderness.  Neurological: He is oriented to person, place, and time. He displays normal reflexes. No cranial nerve deficit or sensory deficit. He exhibits normal muscle tone. Coordination normal.  Skin: Skin is warm and dry. Capillary refill takes less than 2 seconds. He is not diaphoretic.  Psychiatric: He has a normal mood and affect. His behavior is normal. Judgment and thought content normal.  Nursing note and vitals reviewed.     Assessment & Plan:  1. Reactive depression - Will start on Prozac 40 mg. Advised of side effects including suicidal ideation. Advised that if he has any thoughts of suicide then he should stop the medication and go to the ER  - Follow up in one month or sooner if needed  Dorothyann Peng, NP

## 2017-12-01 ENCOUNTER — Other Ambulatory Visit: Payer: Self-pay | Admitting: Family Medicine

## 2017-12-01 DIAGNOSIS — I10 Essential (primary) hypertension: Secondary | ICD-10-CM

## 2017-12-01 MED ORDER — ALLOPURINOL 300 MG PO TABS: 300.0000 mg | ORAL_TABLET | Freq: Every day | ORAL | 0 refills | Status: DC

## 2017-12-01 MED ORDER — OMEPRAZOLE 40 MG PO CPDR: DELAYED_RELEASE_CAPSULE | ORAL | 0 refills | Status: DC

## 2017-12-01 MED ORDER — AMLODIPINE BESYLATE 10 MG PO TABS: 10.0000 mg | ORAL_TABLET | Freq: Every day | ORAL | 0 refills | Status: DC

## 2017-12-01 MED ORDER — SIMVASTATIN 20 MG PO TABS: 20.0000 mg | ORAL_TABLET | Freq: Every day | ORAL | 0 refills | Status: DC

## 2017-12-01 MED ORDER — VALSARTAN-HYDROCHLOROTHIAZIDE 320-25 MG PO TABS: 1.0000 | ORAL_TABLET | Freq: Every day | ORAL | 0 refills | Status: DC

## 2017-12-01 NOTE — Addendum Note (Signed)
Addended by: Miles Costain T on: 12/01/2017 04:14 PM   Modules accepted: Orders

## 2017-12-01 NOTE — Telephone Encounter (Signed)
Sent to the pharmacy by e-scribe. 

## 2017-12-18 ENCOUNTER — Other Ambulatory Visit: Payer: Self-pay | Admitting: Adult Health

## 2017-12-20 NOTE — Telephone Encounter (Signed)
Spoke to the pt and he stated he did want rx sent to local pharmacy and not [facility].  Sent by e-scribe.

## 2017-12-23 ENCOUNTER — Ambulatory Visit (INDEPENDENT_AMBULATORY_CARE_PROVIDER_SITE_OTHER): Payer: Medicare HMO | Admitting: Adult Health

## 2017-12-23 ENCOUNTER — Encounter: Payer: Self-pay | Admitting: Adult Health

## 2017-12-23 DIAGNOSIS — F339 Major depressive disorder, recurrent, unspecified: Secondary | ICD-10-CM | POA: Diagnosis not present

## 2017-12-23 NOTE — Progress Notes (Signed)
Subjective:    Patient ID: Marcus [name], male    DOB: 14-Feb-1941, 77 y.o.   MRN: [id number]  HPI  77 year old male who  has a past medical history of Acute meniscal tear of knee (left), Arthritis, Calcaneal spur, Colon cancer ([place]) (1983), Coronary artery disease, Diverticulosis of colon (without mention of hemorrhage), Echocardiogram findings abnormal, without diagnosis (04-23-2010), Edema of lower extremity, Elevated prostate specific antigen (PSA), Esophageal reflux, External hemorrhoids, Frequency of urination, Gout, Gout, unspecified, Hiatal hernia, History of colon cancer (1988), Hyperlipidemia, Lumbar spondylolysis, Normal nuclear stress test (04-23-2010), OSA (obstructive sleep apnea), Paraesophageal hernia, Personal history of colonic polyps (08/21/2009), Pneumonia, Postgastric surgery syndromes, Prostate cancer ([place]) (2016), Unspecified essential hypertension, Unspecified vitamin D deficiency, Ventral hernia, and Vitamin B12 deficiency.  He presents to the office today for follow up regarding depression.  During his last visit he was started on Prozac 40 mg.  He reports that he is been suffering from depression for at least the last 5 years but just until recently had experiencing worsening symptoms due to the failing health of his wife and him being the main caregiver of her.  He reported mood swings and noticed that he was becoming more agitated with his family.  Today in the office he reports that after starting Prozac 40 mg he feels " a little bit better", but states " you could increase it to 500 mg and with everything I am dealing with right now on the care of Stanton Kidney ( his wife) it won't make a difference. He reports that he has to make a tough decision of putting her in a SNF.   He continues to feel overwhelmed with everything is going on   Review of Systems See HPI   Past Medical History:  Diagnosis Date  . Acute meniscal tear of knee left  . Arthritis    generalized  .  Calcaneal spur   . Colon cancer ([place]) 1983  . Coronary artery disease    dr Stanford Breed  . Diverticulosis of colon (without mention of hemorrhage)   . Echocardiogram findings abnormal, without diagnosis 04-23-2010   normal LV function, mod. left atrial enlargement, mild right artrial enlargement and grade 1 diastolic dysfunction  . Edema of lower extremity    ankles, elevates legs  . Elevated prostate specific antigen (PSA)   . Esophageal reflux   . External hemorrhoids   . Frequency of urination    followed by dr dalhstedt  . Gout   . Gout, unspecified   . Hiatal hernia   . History of colon cancer 1988  . Hyperlipidemia   . Lumbar spondylolysis   . Normal nuclear stress test 04-23-2010   ef 51%,  septal hypokinesis, normal perfusion  . OSA (obstructive sleep apnea)    does not use CPAP   . Paraesophageal hernia    large  . Personal history of colonic polyps 08/21/2009   TUBULAR ADENOMAS  . Pneumonia    history of; last episode 2015  . Postgastric surgery syndromes   . Prostate cancer ([place]) 2016  . Unspecified essential hypertension   . Unspecified vitamin D deficiency   . Ventral hernia   . Vitamin B12 deficiency     Social History   Socioeconomic History  . Marital status: Married    Spouse name: Not on file  . Number of children: 4  . Years of education: Not on file  . Highest education level: Not on file  Occupational History  .  Occupation: retired    Fish farm manager: [employer]  . Financial resource strain: Not on file  . Food insecurity:    Worry: Not on file    Inability: Not on file  . Transportation needs:    Medical: Not on file    Non-medical: Not on file  Tobacco Use  . Smoking status: Former Smoker    Packs/day: 2.00    Years: 20.00    Pack years: 40.00    Types: Cigarettes    Last attempt to quit: 07/05/1974    Years since quitting: 43.4  . Smokeless tobacco: Never Used  Substance and Sexual Activity  . Alcohol use: Yes     Alcohol/week: 0.0 oz    Comment: socially; also 1-2 glasses of wine or beer every other day   . Drug use: No  . Sexual activity: Not on file  Lifestyle  . Physical activity:    Days per week: Not on file    Minutes per session: Not on file  . Stress: Not on file  Relationships  . Social connections:    Talks on phone: Not on file    Gets together: Not on file    Attends religious service: Not on file    Active member of club or organization: Not on file    Attends meetings of clubs or organizations: Not on file    Relationship status: Not on file  . Intimate partner violence:    Fear of current or ex partner: Not on file    Emotionally abused: Not on file    Physically abused: Not on file    Forced sexual activity: Not on file  Other Topics Concern  . Not on file  Social History Narrative   Married   Former Smoker    Alcohol use-yes 1-2 drinks per day      Occupation: Retired Midwife      Originally from [place], [place] - in [place] > 10 yrs as of 2017       Past Surgical History:  Procedure Laterality Date  . CARDIAC CATHETERIZATION    . CATARACT EXTRACTION W/ INTRAOCULAR LENS  IMPLANT, BILATERAL Bilateral   . COLONOSCOPY    . ESOPHAGOGASTRODUODENOSCOPY    . KNEE ARTHROSCOPY Right 2007  . KNEE ARTHROSCOPY  07/13/2011   Procedure: ARTHROSCOPY KNEE;  Surgeon: Cynda Familia;  Location: [facility];  Service: Orthopedics;  Laterality: Left;  partial menisectomy with chondrylplasty  . KNEE SURGERY Left   . LAMINECTOMY AND MICRODISCECTOMY LUMBAR SPINE  MARCH  2008   L3 -  4  . LAPAROSCOPIC INCISIONAL / UMBILICAL / VENTRAL HERNIA REPAIR  2006  . PROSTATE BIOPSY    . SHOULDER ARTHROSCOPY Right 05-23-2007  . SIGMOID COLECTOMY FOR CANCER  1989  . TOTAL KNEE ARTHROPLASTY Left 05/23/2015   Procedure: LEFT TOTAL KNEE ARTHROPLASTY;  Surgeon: Sydnee Cabal, MD;  Location: [facility];  Service: Orthopedics;  Laterality: Left;  [name] Kitchen VERTICAL BANDED GASTROPLASTY  1986     Family History  Problem Relation Age of Onset  . Stroke Paternal Grandfather   . Heart disease Paternal Grandfather   . Esophagitis Father        died from perforated esophagus  . Breast cancer Mother   . Colon cancer Maternal Grandmother        questionable    Allergies  Allergen Reactions  . Contrast Media [Iodinated Diagnostic Agents] Palpitations    TACHYCARDIA- patient states he has tolerated newer  agents since this reaction >30 yrs ago    Current Outpatient Medications on File Prior to Visit  Medication Sig Dispense Refill  . allopurinol (ZYLOPRIM) 300 MG tablet Take 1 tablet (300 mg total) by mouth daily. 90 tablet 0  . amLODipine (NORVASC) 10 MG tablet Take 1 tablet (10 mg total) by mouth daily. 90 tablet 0  . Cholecalciferol (VITAMIN D) 2000 UNITS CAPS Take 1 capsule by mouth daily.    . Cyanocobalamin (VITAMIN B-12) 5000 MCG TBDP Take 1 tablet by mouth daily.     [name] Calcium (STOOL SOFTENER PO) Take 1 tablet by mouth daily.    [name] Kitchen FLUoxetine (PROZAC) 40 MG capsule TAKE 1 CAPSULE BY MOUTH EVERY DAY 90 capsule 0  . furosemide (LASIX) 20 MG tablet Take 1 tablet (20 mg total) by mouth daily. 10 tablet 0  . magnesium oxide (MAG-OX) 400 MG tablet Take 400 mg by mouth daily.    . metoCLOPramide (REGLAN) 10 MG tablet Take 1 tablet (10 mg total) by mouth at bedtime as needed for nausea. Try half-tablet to start - 90 tablet 1  . Multiple Vitamins-Minerals (MULTIVITAMIN WITH MINERALS) tablet Take 1 tablet by mouth daily.    [name] Kitchen omeprazole (PRILOSEC) 40 MG capsule TAKE ONE CAPSULE BY MOUTH ONE TIME DAILY BEFORE MEAL 90 capsule 0  . Oxymetazoline HCl (AFRIN NASAL SPRAY NA) Place 1 Squirt into the nose at bedtime.    . Potassium Gluconate 595 MG CAPS Take 1 capsule by mouth daily.    . simvastatin (ZOCOR) 20 MG tablet Take 1 tablet (20 mg total) by mouth at bedtime. 90 tablet 0  . tamsulosin (FLOMAX) 0.4 MG CAPS capsule     . valsartan-hydrochlorothiazide (DIOVAN-HCT) 320-25 MG  tablet Take 1 tablet by mouth daily. 90 tablet 0   No current facility-administered medications on file prior to visit.     BP (!) 150/70 (BP Location: Left Arm, Cuff Size: Large)   Temp 98 F (36.7 C) (Oral)   Wt 248 lb (112.5 kg)   BMI 37.71 kg/m       Objective:   Physical Exam  Constitutional: He is oriented to person, place, and time. He appears well-developed and well-nourished. No distress.  Cardiovascular: Normal rate, regular rhythm, normal heart sounds and intact distal pulses. Exam reveals no gallop and no friction rub.  No murmur heard. Pulmonary/Chest: Effort normal and breath sounds normal. No stridor. No respiratory distress. He has no wheezes. He has no rales. He exhibits no tenderness.  Neurological: He is alert and oriented to person, place, and time.  Skin: Skin is warm and dry. Capillary refill takes less than 2 seconds. He is not diaphoretic.  Psychiatric: He has a normal mood and affect. His behavior is normal. Judgment and thought content normal.  Nursing note and vitals reviewed.     Assessment & Plan:  1. Depression, recurrent ([place]) - Continue with current dose, he does not want to increase at this time  - Follow up as needed   Dorothyann Peng, NP

## 2017-12-28 ENCOUNTER — Other Ambulatory Visit: Payer: Self-pay | Admitting: Family Medicine

## 2018-02-06 ENCOUNTER — Other Ambulatory Visit: Payer: Self-pay | Admitting: Adult Health

## 2018-02-07 NOTE — Telephone Encounter (Signed)
Sent to the pharmacy by e-scribe. 

## 2018-02-07 NOTE — Telephone Encounter (Signed)
Ok to send in for 90 +1 

## 2018-02-07 NOTE — Telephone Encounter (Signed)
Pt bringing in his wife today.  Due for cpx.  Please advise.

## 2018-03-09 DIAGNOSIS — C61 Malignant neoplasm of prostate: Secondary | ICD-10-CM | POA: Diagnosis not present

## 2018-03-15 DIAGNOSIS — C61 Malignant neoplasm of prostate: Secondary | ICD-10-CM | POA: Diagnosis not present

## 2018-03-24 ENCOUNTER — Ambulatory Visit (INDEPENDENT_AMBULATORY_CARE_PROVIDER_SITE_OTHER): Payer: Medicare HMO | Admitting: Family Medicine

## 2018-03-24 ENCOUNTER — Encounter: Payer: Self-pay | Admitting: Family Medicine

## 2018-03-24 VITALS — BP 120/80 | HR 68 | Temp 99.9°F | Wt 237.2 lb

## 2018-03-24 DIAGNOSIS — J069 Acute upper respiratory infection, unspecified: Secondary | ICD-10-CM | POA: Diagnosis not present

## 2018-03-24 DIAGNOSIS — K0889 Other specified disorders of teeth and supporting structures: Secondary | ICD-10-CM | POA: Diagnosis not present

## 2018-03-24 MED ORDER — AMOXICILLIN-POT CLAVULANATE 875-125 MG PO TABS: 1.0000 | ORAL_TABLET | Freq: Two times a day (BID) | ORAL | 0 refills | Status: DC

## 2018-03-24 NOTE — Progress Notes (Signed)
   Subjective:    Patient ID: Marcus [name], male    DOB: 06-Feb-1941, 77 y.o.   MRN: [id number]  HPI Here for several issues. First he has had pain in a tooth for several weeks and he thinks it is infected. It hurts to chew. He has not seen his dentist about it because he has been spending time with his wife who has been hsopitalized. Also 3 days ago he developed a fever and he is coughing up yellow sputum.    Review of Systems  Constitutional: Positive for fever.  HENT: Positive for dental problem. Negative for sinus pressure, sinus pain and sore throat.   Eyes: Negative.   Respiratory: Positive for cough and chest tightness. Negative for shortness of breath and wheezing.   Cardiovascular: Negative.        Objective:   Physical Exam  Constitutional: He appears well-developed and well-nourished.  HENT:  Right Ear: External ear normal.  Left Ear: External ear normal.  Mouth/Throat: Oropharynx is clear and moist.  He is tender over the right upper jaw area   Eyes: Conjunctivae are normal.  Neck: No thyromegaly present.  Cardiovascular: Normal rate, regular rhythm, normal heart sounds and intact distal pulses.  Pulmonary/Chest: Effort normal and breath sounds normal. No stridor. No respiratory distress. He has no wheezes. He has no rales.          Assessment & Plan:  He has a viral URI. Drink fluids and take Mucinex prn. He likely has an abscessed tooth. Treat with Augmentin. He plans to see his dentist next week.  Alysia Penna, MD

## 2018-04-18 DIAGNOSIS — M542 Cervicalgia: Secondary | ICD-10-CM | POA: Diagnosis not present

## 2018-04-19 DIAGNOSIS — M545 Low back pain: Secondary | ICD-10-CM | POA: Diagnosis not present

## 2018-04-27 ENCOUNTER — Encounter: Payer: Self-pay | Admitting: Adult Health

## 2018-04-27 ENCOUNTER — Ambulatory Visit (INDEPENDENT_AMBULATORY_CARE_PROVIDER_SITE_OTHER): Payer: Medicare HMO | Admitting: Adult Health

## 2018-04-27 ENCOUNTER — Ambulatory Visit: Payer: Self-pay | Admitting: *Deleted

## 2018-04-27 VITALS — BP 110/60 | Temp 98.2°F | Wt 242.0 lb

## 2018-04-27 DIAGNOSIS — J020 Streptococcal pharyngitis: Secondary | ICD-10-CM | POA: Diagnosis not present

## 2018-04-27 LAB — POCT RAPID STREP A (OFFICE): Rapid Strep A Screen: POSITIVE — AB

## 2018-04-27 MED ORDER — AMOXICILLIN 500 MG PO CAPS: 500.0000 mg | ORAL_CAPSULE | Freq: Two times a day (BID) | ORAL | 0 refills | Status: AC

## 2018-04-27 NOTE — Telephone Encounter (Signed)
Pt called because he is having sore throat, trouble swallowing because everything is swollen, he also says that his glands on the right are swollen, the pt also says that he has a cough; this has been going on for 3 days; recommendations made per nurse triage protocol; pt offered and accepted appointment with Dorothyann Peng, 04/27/18 at 2; he verbalized understanding; will route to office for notification. Reason for Disposition . [1] Sore throat is the only symptom AND [2] present > 48 hours  Answer Assessment - Initial Assessment Questions 1. ONSET: "When did the throat start hurting?" (Hours or days ago)      04/15/18 2. SEVERITY: "How bad is the sore throat?" (Scale 1-10; mild, moderate or severe)   - MILD (1-3):  doesn't interfere with eating or normal activities   - MODERATE (4-7): interferes with eating some solids and normal activities   - SEVERE (8-10):  excruciating pain, interferes with most normal activities   - SEVERE DYSPHAGIA: can't swallow liquids, drooling     moderate 3. STREP EXPOSURE: "Has there been any exposure to strep within the past week?" If so, ask: "What type of contact occurred?"      'I don't thinks so" 4.  VIRAL SYMPTOMS: "Are there any symptoms of a cold, such as a runny nose, cough, hoarse voice or red eyes?"      Cough, hoarse 5. FEVER: "Do you have a fever?" If so, ask: "What is your temperature, how was it measured, and when did it start?"     no 6. PUS ON THE TONSILS: "Is there pus on the tonsils in the back of your throat?"     Can not visualize 7. OTHER SYMPTOMS: "Do you have any other symptoms?" (e.g., difficulty breathing, headache, rash)     Glands on right swollen 8. PREGNANCY: "Is there any chance you are pregnant?" "When was your last menstrual period?"     n/a  Protocols used: SORE THROAT-A-AH

## 2018-04-27 NOTE — Telephone Encounter (Signed)
Noted  

## 2018-04-27 NOTE — Progress Notes (Signed)
   Subjective:    Patient ID: Marcus [name], male    DOB: [birth year], 77 y.o.   MRN: [id number]  Sore Throat   This is a new problem. The current episode started in the past 7 days. The problem has been waxing and waning. There has been no fever. Associated symptoms include coughing, a hoarse voice, swollen glands and trouble swallowing. Pertinent negatives include no drooling, ear discharge, ear pain, plugged ear sensation or shortness of breath. He has had no exposure to strep or mono. He has tried nothing for the symptoms. The treatment provided no relief.    Review of Systems  Constitutional: Positive for activity change and fatigue.  HENT: Positive for hoarse voice, sore throat and trouble swallowing. Negative for drooling, ear discharge, ear pain, sinus pressure and sinus pain.   Respiratory: Positive for cough. Negative for shortness of breath.   Cardiovascular: Negative.        Objective:   Physical Exam  Constitutional: He is oriented to person, place, and time. He appears well-developed and well-nourished. No distress.  HENT:  Mouth/Throat: Uvula is midline and mucous membranes are normal. Oropharyngeal exudate, posterior oropharyngeal edema and posterior oropharyngeal erythema present. No tonsillar abscesses. Tonsils are 2+ on the right. Tonsillar exudate.  Cardiovascular: Normal rate, regular rhythm, normal heart sounds and intact distal pulses.  Pulmonary/Chest: Effort normal and breath sounds normal.  Neurological: He is alert and oriented to person, place, and time.  Skin: Skin is warm and dry. He is not diaphoretic.  Psychiatric: He has a normal mood and affect. His behavior is normal. Judgment and thought content normal.  Nursing note and vitals reviewed.      Assessment & Plan:  1. Strep throat - POC Rapid Strep A- Positive  - amoxicillin (AMOXIL) 500 MG capsule; Take 1 capsule (500 mg total) by mouth 2 (two) times daily for 10 days.  Dispense: 20 capsule; Refill:  0 - Warm drinks  - Salt water gargles - Follow up if no improvement in the next 2-3 days   Dorothyann Peng, NP

## 2018-04-27 NOTE — Telephone Encounter (Signed)
Will send to [facility] as FYI 

## 2018-05-01 DIAGNOSIS — M542 Cervicalgia: Secondary | ICD-10-CM | POA: Diagnosis not present

## 2018-05-12 ENCOUNTER — Encounter: Payer: Self-pay | Admitting: Adult Health

## 2018-05-12 ENCOUNTER — Ambulatory Visit (INDEPENDENT_AMBULATORY_CARE_PROVIDER_SITE_OTHER): Payer: Medicare HMO | Admitting: Adult Health

## 2018-05-12 VITALS — BP 112/62 | HR 52 | Temp 98.1°F | Wt 246.9 lb

## 2018-05-12 DIAGNOSIS — J0140 Acute pansinusitis, unspecified: Secondary | ICD-10-CM | POA: Diagnosis not present

## 2018-05-12 MED ORDER — HYDROCODONE-HOMATROPINE 5-1.5 MG/5ML PO SYRP: 5.0000 mL | ORAL_SOLUTION | Freq: Three times a day (TID) | ORAL | 0 refills | Status: DC | PRN

## 2018-05-12 MED ORDER — DOXYCYCLINE HYCLATE 100 MG PO CAPS: 100.0000 mg | ORAL_CAPSULE | Freq: Two times a day (BID) | ORAL | 0 refills | Status: DC

## 2018-05-12 NOTE — Progress Notes (Signed)
   Subjective:    Patient ID: Marcus [name], male    DOB: 27-Jun-1941, 77 y.o.   MRN: [id number]  Sinusitis  This is a new problem. The current episode started in the past 7 days. The problem has been gradually worsening since onset. There has been no fever. Associated symptoms include congestion, coughing (productive), headaches, sinus pressure and swollen glands. Pertinent negatives include no chills, ear pain, shortness of breath or sore throat. Past treatments include nothing.      Review of Systems  Constitutional: Positive for activity change, appetite change and fatigue. Negative for chills and fever.  HENT: Positive for congestion, postnasal drip, rhinorrhea, sinus pressure and sinus pain. Negative for ear pain, sore throat, trouble swallowing and voice change.   Respiratory: Positive for cough (productive). Negative for chest tightness, shortness of breath and wheezing.   Cardiovascular: Negative.   Neurological: Positive for headaches.       Objective:   Physical Exam  Constitutional: He is oriented to person, place, and time. He appears well-developed and well-nourished. No distress.  HENT:  Right Ear: Hearing, tympanic membrane, external ear and ear canal normal.  Left Ear: Hearing, tympanic membrane, external ear and ear canal normal.  Nose: Mucosal edema and rhinorrhea present. Right sinus exhibits maxillary sinus tenderness and frontal sinus tenderness. Left sinus exhibits maxillary sinus tenderness and frontal sinus tenderness.  Mouth/Throat: Uvula is midline and mucous membranes are normal. Oropharyngeal exudate present.  Cardiovascular: Normal rate, regular rhythm, normal heart sounds and intact distal pulses.  Pulmonary/Chest: Effort normal and breath sounds normal.  Lymphadenopathy:       Head (right side): Submandibular and tonsillar adenopathy present.       Head (left side): Submandibular and tonsillar adenopathy present.    He has no cervical adenopathy.    He  has no axillary adenopathy.  Neurological: He is alert and oriented to person, place, and time.  Skin: Skin is warm and dry. Capillary refill takes less than 2 seconds. He is not diaphoretic.  Psychiatric: He has a normal mood and affect. His behavior is normal. Judgment and thought content normal.  Nursing note and vitals reviewed.     Assessment & Plan:  1. Acute non-recurrent pansinusitis - doxycycline (VIBRAMYCIN) 100 MG capsule; Take 1 capsule (100 mg total) by mouth 2 (two) times daily.  Dispense: 14 capsule; Refill: 0 - HYDROcodone-homatropine (HYCODAN) 5-1.5 MG/5ML syrup; Take 5 mLs by mouth every 8 (eight) hours as needed for cough.  Dispense: 120 mL; Refill: 0 - Follow up if no improvement in the next 2-3 days   Dorothyann Peng, NP

## 2018-05-23 ENCOUNTER — Ambulatory Visit (INDEPENDENT_AMBULATORY_CARE_PROVIDER_SITE_OTHER): Payer: Medicare HMO | Admitting: Adult Health

## 2018-05-23 ENCOUNTER — Encounter: Payer: Self-pay | Admitting: Adult Health

## 2018-05-23 VITALS — BP 122/82 | Temp 98.1°F | Wt 252.0 lb

## 2018-05-23 DIAGNOSIS — L821 Other seborrheic keratosis: Secondary | ICD-10-CM

## 2018-05-23 NOTE — Progress Notes (Signed)
Subjective:    Patient ID: Marcus [name], male    DOB: [birth year], 77 y.o.   MRN: [id number]  HPI  77 year old male who  has a past medical history of Acute meniscal tear of knee (left), Arthritis, Calcaneal spur, Colon cancer ([place]) (1983), Coronary artery disease, Diverticulosis of colon (without mention of hemorrhage), Echocardiogram findings abnormal, without diagnosis (04-23-2010), Edema of lower extremity, Elevated prostate specific antigen (PSA), Esophageal reflux, External hemorrhoids, Frequency of urination, Gout, Gout, unspecified, Hiatal hernia, History of colon cancer (1988), Hyperlipidemia, Lumbar spondylolysis, Normal nuclear stress test (04-23-2010), OSA (obstructive sleep apnea), Paraesophageal hernia, Personal history of colonic polyps (08/21/2009), Pneumonia, Postgastric surgery syndromes, Prostate cancer ([place]) (2016), Unspecified essential hypertension, Unspecified vitamin D deficiency, Ventral hernia, and Vitamin B12 deficiency.  He presents to the office today for concern of neoplasm on his face. Area in question is below his right eye. He feels as though this area is becoming larger. It can become irritated when he washes his face. Denies any bleeding.   Review of Systems See HPI   Past Medical History:  Diagnosis Date  . Acute meniscal tear of knee left  . Arthritis    generalized  . Calcaneal spur   . Colon cancer ([place]) 1983  . Coronary artery disease    dr Stanford Breed  . Diverticulosis of colon (without mention of hemorrhage)   . Echocardiogram findings abnormal, without diagnosis 04-23-2010   normal LV function, mod. left atrial enlargement, mild right artrial enlargement and grade 1 diastolic dysfunction  . Edema of lower extremity    ankles, elevates legs  . Elevated prostate specific antigen (PSA)   . Esophageal reflux   . External hemorrhoids   . Frequency of urination    followed by dr dalhstedt  . Gout   . Gout, unspecified   . Hiatal hernia   .  History of colon cancer 1988  . Hyperlipidemia   . Lumbar spondylolysis   . Normal nuclear stress test 04-23-2010   ef 51%,  septal hypokinesis, normal perfusion  . OSA (obstructive sleep apnea)    does not use CPAP   . Paraesophageal hernia    large  . Personal history of colonic polyps 08/21/2009   TUBULAR ADENOMAS  . Pneumonia    history of; last episode 2015  . Postgastric surgery syndromes   . Prostate cancer ([place]) 2016  . Unspecified essential hypertension   . Unspecified vitamin D deficiency   . Ventral hernia   . Vitamin B12 deficiency     Social History   Socioeconomic History  . Marital status: Married    Spouse name: Not on file  . Number of children: 4  . Years of education: Not on file  . Highest education level: Not on file  Occupational History  . Occupation: retired    Fish farm manager: [employer]  . Financial resource strain: Not on file  . Food insecurity:    Worry: Not on file    Inability: Not on file  . Transportation needs:    Medical: Not on file    Non-medical: Not on file  Tobacco Use  . Smoking status: Former Smoker    Packs/day: 2.00    Years: 20.00    Pack years: 40.00    Types: Cigarettes    Last attempt to quit: 07/05/1974    Years since quitting: 43.9  . Smokeless tobacco: Never Used  Substance and Sexual Activity  . Alcohol use: Yes  Alcohol/week: 0.0 standard drinks    Comment: socially; also 1-2 glasses of wine or beer every other day   . Drug use: No  . Sexual activity: Not on file  Lifestyle  . Physical activity:    Days per week: Not on file    Minutes per session: Not on file  . Stress: Not on file  Relationships  . Social connections:    Talks on phone: Not on file    Gets together: Not on file    Attends religious service: Not on file    Active member of club or organization: Not on file    Attends meetings of clubs or organizations: Not on file    Relationship status: Not on file  . Intimate  partner violence:    Fear of current or ex partner: Not on file    Emotionally abused: Not on file    Physically abused: Not on file    Forced sexual activity: Not on file  Other Topics Concern  . Not on file  Social History Narrative   Married   Former Smoker    Alcohol use-yes 1-2 drinks per day      Occupation: Retired Midwife      Originally from [place], [place] - in [place] > 10 yrs as of 2017       Past Surgical History:  Procedure Laterality Date  . CARDIAC CATHETERIZATION    . CATARACT EXTRACTION W/ INTRAOCULAR LENS  IMPLANT, BILATERAL Bilateral   . COLONOSCOPY    . ESOPHAGOGASTRODUODENOSCOPY    . KNEE ARTHROSCOPY Right 2007  . KNEE ARTHROSCOPY  07/13/2011   Procedure: ARTHROSCOPY KNEE;  Surgeon: Cynda Familia;  Location: [facility];  Service: Orthopedics;  Laterality: Left;  partial menisectomy with chondrylplasty  . KNEE SURGERY Left   . LAMINECTOMY AND MICRODISCECTOMY LUMBAR SPINE  MARCH  2008   L3 -  4  . LAPAROSCOPIC INCISIONAL / UMBILICAL / VENTRAL HERNIA REPAIR  2006  . PROSTATE BIOPSY    . SHOULDER ARTHROSCOPY Right 05-23-2007  . SIGMOID COLECTOMY FOR CANCER  1989  . TOTAL KNEE ARTHROPLASTY Left 05/23/2015   Procedure: LEFT TOTAL KNEE ARTHROPLASTY;  Surgeon: Sydnee Cabal, MD;  Location: [facility];  Service: Orthopedics;  Laterality: Left;  [name] Kitchen VERTICAL BANDED GASTROPLASTY  1986    Family History  Problem Relation Age of Onset  . Stroke Paternal Grandfather   . Heart disease Paternal Grandfather   . Esophagitis Father        died from perforated esophagus  . Breast cancer Mother   . Colon cancer Maternal Grandmother        questionable    Allergies  Allergen Reactions  . Contrast Media [Iodinated Diagnostic Agents] Palpitations    TACHYCARDIA- patient states he has tolerated newer agents since this reaction >30 yrs ago    Current Outpatient Medications on File Prior to Visit  Medication Sig Dispense Refill  . allopurinol (ZYLOPRIM)  300 MG tablet Take 1 tablet (300 mg total) by mouth daily. 90 tablet 0  . amLODipine (NORVASC) 10 MG tablet Take 1 tablet (10 mg total) by mouth daily. 90 tablet 0  . Cholecalciferol (VITAMIN D) 2000 UNITS CAPS Take 1 capsule by mouth daily.    . Cyanocobalamin (VITAMIN B-12) 5000 MCG TBDP Take 1 tablet by mouth daily.     [name] Calcium (STOOL SOFTENER PO) Take 1 tablet by mouth daily.    . magnesium oxide (MAG-OX) 400 MG tablet Take 400  mg by mouth daily.    . metoCLOPramide (REGLAN) 10 MG tablet Take 1 tablet (10 mg total) by mouth at bedtime as needed for nausea. Try half-tablet to start - 90 tablet 1  . Multiple Vitamins-Minerals (MULTIVITAMIN WITH MINERALS) tablet Take 1 tablet by mouth daily.    [name] Kitchen omeprazole (PRILOSEC) 40 MG capsule TAKE ONE CAPSULE BY MOUTH ONE TIME DAILY BEFORE MEAL 90 capsule 0  . Oxymetazoline HCl (AFRIN NASAL SPRAY NA) Place 1 Squirt into the nose at bedtime.    . simvastatin (ZOCOR) 20 MG tablet TAKE 1 TABLET AT BEDTIME 90 tablet 1  . tamsulosin (FLOMAX) 0.4 MG CAPS capsule     . valsartan-hydrochlorothiazide (DIOVAN-HCT) 320-25 MG tablet Take 1 tablet by mouth daily. 90 tablet 0   No current facility-administered medications on file prior to visit.     BP 122/82   Temp 98.1 F (36.7 C)   Wt 252 lb (114.3 kg)   BMI 38.32 kg/m       Objective:   Physical Exam  Constitutional: He is oriented to person, place, and time. He appears well-developed and well-nourished.  Musculoskeletal: Normal range of motion.  Neurological: He is alert and oriented to person, place, and time.  Skin: Skin is warm and dry. Capillary refill takes less than 2 seconds.  3 mm Seborrheic keratosis below right eye.   Psychiatric: He has a normal mood and affect. His behavior is normal. Judgment and thought content normal.  Nursing note and vitals reviewed.     Assessment & Plan:  1. Seborrheic keratosis - Discussed options of removal, including cryotherapy, shave incision,  and doing nothing. He opted to try cryotherapy. Informed consent was obtained. Patient tolerated procedure well.  - Follow up as needed    Dorothyann Peng, NP

## 2018-06-13 DIAGNOSIS — M961 Postlaminectomy syndrome, not elsewhere classified: Secondary | ICD-10-CM | POA: Diagnosis not present

## 2018-06-13 DIAGNOSIS — M5136 Other intervertebral disc degeneration, lumbar region: Secondary | ICD-10-CM | POA: Diagnosis not present

## 2018-07-03 ENCOUNTER — Other Ambulatory Visit: Payer: Self-pay | Admitting: Urology

## 2018-07-03 DIAGNOSIS — C61 Malignant neoplasm of prostate: Secondary | ICD-10-CM

## 2018-07-06 DIAGNOSIS — M961 Postlaminectomy syndrome, not elsewhere classified: Secondary | ICD-10-CM | POA: Diagnosis not present

## 2018-07-06 DIAGNOSIS — M5136 Other intervertebral disc degeneration, lumbar region: Secondary | ICD-10-CM | POA: Diagnosis not present

## 2018-07-10 ENCOUNTER — Ambulatory Visit: Payer: Self-pay

## 2018-07-10 NOTE — Telephone Encounter (Signed)
Marcus [name] - please advise where pt can be worked in. Thanks!
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
## 2018-07-11 NOTE — Telephone Encounter (Signed)
Left a message for a return call.

## 2018-07-11 NOTE — Telephone Encounter (Signed)
Appt scheduled for 07/12/18 @ 10 AM.  Pt notified to arrive when he called and spoke to the [facility].

## 2018-07-11 NOTE — Telephone Encounter (Signed)
We can fit him in tomorrow

## 2018-07-12 ENCOUNTER — Encounter: Payer: Self-pay | Admitting: Adult Health

## 2018-07-12 ENCOUNTER — Ambulatory Visit (INDEPENDENT_AMBULATORY_CARE_PROVIDER_SITE_OTHER): Payer: Medicare HMO | Admitting: Adult Health

## 2018-07-12 VITALS — BP 144/74 | Temp 98.1°F | Wt 253.0 lb

## 2018-07-12 DIAGNOSIS — R55 Syncope and collapse: Secondary | ICD-10-CM

## 2018-07-12 DIAGNOSIS — C19 Malignant neoplasm of rectosigmoid junction: Secondary | ICD-10-CM | POA: Diagnosis not present

## 2018-07-12 DIAGNOSIS — G4730 Sleep apnea, unspecified: Secondary | ICD-10-CM

## 2018-07-12 LAB — CBC WITH DIFFERENTIAL/PLATELET
Basophils Absolute: 0 10*3/uL (ref 0.0–0.1)
Basophils Relative: 0.5 % (ref 0.0–3.0)
Eosinophils Absolute: 0.1 10*3/uL (ref 0.0–0.7)
Eosinophils Relative: 1.2 % (ref 0.0–5.0)
HCT: 42.3 % (ref 39.0–52.0)
Hemoglobin: 14.5 g/dL (ref 13.0–17.0)
Lymphocytes Relative: 25.7 % (ref 12.0–46.0)
Lymphs Abs: 1.5 10*3/uL (ref 0.7–4.0)
MCHC: 34.2 g/dL (ref 30.0–36.0)
MCV: 90.2 fl (ref 78.0–100.0)
Monocytes Absolute: 0.6 10*3/uL (ref 0.1–1.0)
Monocytes Relative: 9.7 % (ref 3.0–12.0)
Neutro Abs: 3.7 10*3/uL (ref 1.4–7.7)
Neutrophils Relative %: 62.9 % (ref 43.0–77.0)
Platelets: 197 10*3/uL (ref 150.0–400.0)
RBC: 4.69 Mil/uL (ref 4.22–5.81)
RDW: 15.7 % — ABNORMAL HIGH (ref 11.5–15.5)
WBC: 6 10*3/uL (ref 4.0–10.5)

## 2018-07-12 LAB — BASIC METABOLIC PANEL
BUN: 28 mg/dL — ABNORMAL HIGH (ref 6–23)
CO2: 29 mEq/L (ref 19–32)
Calcium: 9.4 mg/dL (ref 8.4–10.5)
Chloride: 105 mEq/L (ref 96–112)
Creatinine, Ser: 1.14 mg/dL (ref 0.40–1.50)
GFR: 65.93 mL/min (ref >60.00)
Glucose, Bld: 87 mg/dL (ref 70–99)
Potassium: 4.5 mEq/L (ref 3.5–5.1)
Sodium: 142 mEq/L (ref 135–145)

## 2018-07-12 LAB — TSH: TSH: 0.69 u[IU]/mL (ref 0.35–4.50)
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
## 2018-07-13 ENCOUNTER — Ambulatory Visit
Admission: RE | Admit: 2018-07-13 | Discharge: 2018-07-13 | Disposition: A | Discharge status: Home / Self Care | Payer: Medicare HMO | Source: Ambulatory Visit | Attending: Urology | Admitting: Urology

## 2018-07-13 DIAGNOSIS — C61 Malignant neoplasm of prostate: Secondary | ICD-10-CM

## 2018-07-13 LAB — IRON,TIBC AND FERRITIN PANEL
%SAT: 18 % (calc) — ABNORMAL LOW (ref 20–48)
Ferritin: 21 ng/mL — ABNORMAL LOW (ref 24–380)
Iron: 62 ug/dL (ref 50–180)
TIBC: 339 mcg/dL (calc) (ref 250–425)

## 2018-07-13 MED ORDER — GADOBENATE DIMEGLUMINE 529 MG/ML IV SOLN
20.0000 mL | Freq: Once | INTRAVENOUS | Status: AC | PRN
  Administered 2018-07-13: 20 mL via INTRAVENOUS

## 2018-07-17 ENCOUNTER — Encounter: Payer: Self-pay | Admitting: Cardiology

## 2018-07-17 ENCOUNTER — Ambulatory Visit: Payer: Medicare HMO | Admitting: Cardiology

## 2018-07-17 VITALS — BP 120/58 | HR 65 | Ht 69.0 in | Wt 252.0 lb

## 2018-07-17 DIAGNOSIS — I10 Essential (primary) hypertension: Secondary | ICD-10-CM | POA: Diagnosis not present

## 2018-07-17 DIAGNOSIS — E7800 Pure hypercholesterolemia, unspecified: Secondary | ICD-10-CM

## 2018-07-17 DIAGNOSIS — R55 Syncope and collapse: Secondary | ICD-10-CM | POA: Diagnosis not present
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
## 2018-07-17 NOTE — Patient Instructions (Signed)
Medication Instructions:  NO CHANGE If you need a refill on your cardiac medications before your next appointment, please call your pharmacy.   Lab work: If you have labs (blood work) drawn today and your tests are completely normal, you will receive your results only by: [name] Kitchen MyChart Message (if you have MyChart) OR . A paper copy in the mail If you have any lab test that is abnormal or we need to change your treatment, we will call you to review the results.  Testing/Procedures: Your physician has requested that you have an echocardiogram. Echocardiography is a painless test that uses sound waves to create images of your heart. It provides your doctor with information about the size and shape of your heart and how well your heart's chambers and valves are working. This procedure takes approximately one hour. There are no restrictions for this procedure.   Your physician has recommended that you wear a 30 DAY event monitor. Event monitors are medical devices that record the heart's electrical activity. Doctors most often [place] these monitors to diagnose arrhythmias. Arrhythmias are problems with the speed or rhythm of the heartbeat. The monitor is a small, portable device. You can wear one while you do your normal daily activities. This is usually used to diagnose what is causing palpitations/syncope (passing out).    Follow-Up: At [facility], you and your health needs are our priority.  As part of our continuing mission to provide you with exceptional heart care, we have created designated Provider Care Teams.  These Care Teams include your primary Cardiologist (physician) and Advanced Practice Providers (APPs -  Physician Assistants and Nurse Practitioners) who all work together to provide you with the care you need, when you need it.  Your physician recommends that you schedule a follow-up appointment in: [place]

## 2018-07-24 ENCOUNTER — Other Ambulatory Visit: Payer: Self-pay

## 2018-07-24 ENCOUNTER — Ambulatory Visit (INDEPENDENT_AMBULATORY_CARE_PROVIDER_SITE_OTHER): Payer: Medicare HMO

## 2018-07-24 ENCOUNTER — Ambulatory Visit (HOSPITAL_COMMUNITY): Payer: Medicare HMO | Attending: Cardiology

## 2018-07-24 DIAGNOSIS — R55 Syncope and collapse: Secondary | ICD-10-CM | POA: Insufficient documentation

## 2018-07-29 ENCOUNTER — Telehealth: Payer: Self-pay | Admitting: Cardiology

## 2018-07-29 NOTE — Telephone Encounter (Signed)
I was notified by [employer] that the patient had a run of NSVT x 8 beats and subsequently went back to NSR. Attempts from [employer] to call the patient were unsuccessful.   I called the patient who denies any chest pain, palpitations or syncope. He states that he may have experienced mild lightheadedness this morning.  I encouraged the patient to continue to monitor his symptoms and come to the ED if he develops any symptoms.

## 2018-08-01 ENCOUNTER — Encounter (HOSPITAL_COMMUNITY): Payer: Self-pay

## 2018-08-01 ENCOUNTER — Inpatient Hospital Stay (HOSPITAL_COMMUNITY)
Admission: EM | Admit: 2018-08-01 | Discharge: 2018-08-03 | DRG: 244 | Disposition: A | Discharge status: Home / Self Care | Payer: Medicare HMO | Attending: Internal Medicine | Admitting: Internal Medicine

## 2018-08-01 ENCOUNTER — Telehealth: Payer: Self-pay | Admitting: Cardiology

## 2018-08-01 ENCOUNTER — Other Ambulatory Visit: Payer: Self-pay

## 2018-08-01 ENCOUNTER — Encounter (HOSPITAL_COMMUNITY)
Admission: EM | Disposition: A | Discharge status: Home / Self Care | Payer: Self-pay | Source: Home / Self Care | Attending: Internal Medicine

## 2018-08-01 DIAGNOSIS — Z9841 Cataract extraction status, right eye: Secondary | ICD-10-CM

## 2018-08-01 DIAGNOSIS — C61 Malignant neoplasm of prostate: Secondary | ICD-10-CM | POA: Diagnosis present

## 2018-08-01 DIAGNOSIS — I441 Atrioventricular block, second degree: Secondary | ICD-10-CM | POA: Diagnosis not present

## 2018-08-01 DIAGNOSIS — E785 Hyperlipidemia, unspecified: Secondary | ICD-10-CM | POA: Diagnosis present

## 2018-08-01 DIAGNOSIS — I10 Essential (primary) hypertension: Secondary | ICD-10-CM | POA: Diagnosis present

## 2018-08-01 DIAGNOSIS — Z823 Family history of stroke: Secondary | ICD-10-CM | POA: Diagnosis not present

## 2018-08-01 DIAGNOSIS — M109 Gout, unspecified: Secondary | ICD-10-CM | POA: Diagnosis present

## 2018-08-01 DIAGNOSIS — Z8249 Family history of ischemic heart disease and other diseases of the circulatory system: Secondary | ICD-10-CM | POA: Diagnosis not present

## 2018-08-01 DIAGNOSIS — M1990 Unspecified osteoarthritis, unspecified site: Secondary | ICD-10-CM | POA: Diagnosis present

## 2018-08-01 DIAGNOSIS — E538 Deficiency of other specified B group vitamins: Secondary | ICD-10-CM | POA: Diagnosis present

## 2018-08-01 DIAGNOSIS — Z85038 Personal history of other malignant neoplasm of large intestine: Secondary | ICD-10-CM

## 2018-08-01 DIAGNOSIS — I442 Atrioventricular block, complete: Secondary | ICD-10-CM | POA: Diagnosis present

## 2018-08-01 DIAGNOSIS — Z803 Family history of malignant neoplasm of breast: Secondary | ICD-10-CM | POA: Diagnosis not present

## 2018-08-01 DIAGNOSIS — Z9884 Bariatric surgery status: Secondary | ICD-10-CM | POA: Diagnosis not present

## 2018-08-01 DIAGNOSIS — R55 Syncope and collapse: Secondary | ICD-10-CM | POA: Diagnosis present

## 2018-08-01 DIAGNOSIS — Z950 Presence of cardiac pacemaker: Secondary | ICD-10-CM

## 2018-08-01 DIAGNOSIS — Z91041 Radiographic dye allergy status: Secondary | ICD-10-CM | POA: Diagnosis not present

## 2018-08-01 DIAGNOSIS — I4430 Unspecified atrioventricular block: Secondary | ICD-10-CM

## 2018-08-01 DIAGNOSIS — Z87898 Personal history of other specified conditions: Secondary | ICD-10-CM

## 2018-08-01 DIAGNOSIS — Z9119 Patient's noncompliance with other medical treatment and regimen: Secondary | ICD-10-CM | POA: Diagnosis not present

## 2018-08-01 DIAGNOSIS — J9811 Atelectasis: Secondary | ICD-10-CM | POA: Diagnosis not present

## 2018-08-01 DIAGNOSIS — K5730 Diverticulosis of large intestine without perforation or abscess without bleeding: Secondary | ICD-10-CM | POA: Diagnosis present

## 2018-08-01 DIAGNOSIS — I2510 Atherosclerotic heart disease of native coronary artery without angina pectoris: Secondary | ICD-10-CM | POA: Diagnosis present

## 2018-08-01 DIAGNOSIS — Z96652 Presence of left artificial knee joint: Secondary | ICD-10-CM | POA: Diagnosis present

## 2018-08-01 DIAGNOSIS — I440 Atrioventricular block, first degree: Secondary | ICD-10-CM | POA: Diagnosis not present

## 2018-08-01 DIAGNOSIS — E559 Vitamin D deficiency, unspecified: Secondary | ICD-10-CM | POA: Diagnosis present

## 2018-08-01 DIAGNOSIS — Z87891 Personal history of nicotine dependence: Secondary | ICD-10-CM

## 2018-08-01 DIAGNOSIS — G4733 Obstructive sleep apnea (adult) (pediatric): Secondary | ICD-10-CM | POA: Diagnosis present

## 2018-08-01 DIAGNOSIS — Z961 Presence of intraocular lens: Secondary | ICD-10-CM | POA: Diagnosis present

## 2018-08-01 DIAGNOSIS — Z9842 Cataract extraction status, left eye: Secondary | ICD-10-CM

## 2018-08-01 DIAGNOSIS — Z8719 Personal history of other diseases of the digestive system: Secondary | ICD-10-CM

## 2018-08-01 DIAGNOSIS — I459 Conduction disorder, unspecified: Secondary | ICD-10-CM | POA: Diagnosis not present

## 2018-08-01 HISTORY — DX: Unspecified atrioventricular block: I44.30

## 2018-08-01 LAB — CBC
HCT: 43.4 % (ref 39.0–52.0)
Hemoglobin: 13.6 g/dL (ref 13.0–17.0)
MCH: 29 pg (ref 26.0–34.0)
MCHC: 31.3 g/dL (ref 30.0–36.0)
MCV: 92.5 fL (ref 80.0–100.0)
Platelets: 198 10*3/uL (ref 150–400)
RBC: 4.69 MIL/uL (ref 4.22–5.81)
RDW: 14.2 % (ref 11.5–15.5)
WBC: 5.7 10*3/uL (ref 4.0–10.5)
nRBC: 0 % (ref 0.0–0.2)

## 2018-08-01 LAB — BASIC METABOLIC PANEL
Anion gap: 11 (ref 5–15)
BUN: 23 mg/dL (ref 8–23)
CO2: 25 mmol/L (ref 22–32)
Calcium: 9.2 mg/dL (ref 8.9–10.3)
Chloride: 105 mmol/L (ref 98–111)
Creatinine, Ser: 1.27 mg/dL — ABNORMAL HIGH (ref 0.61–1.24)
GFR calc Af Amer: >60 mL/min (ref >60)
GFR calc non Af Amer: 54 mL/min — ABNORMAL LOW (ref >60)
Glucose, Bld: 111 mg/dL — ABNORMAL HIGH (ref 70–99)
Potassium: 3.7 mmol/L (ref 3.5–5.1)
Sodium: 141 mmol/L (ref 135–145)

## 2018-08-01 LAB — I-STAT TROPONIN, ED: Troponin i, poc: 0.01 ng/mL (ref 0.00–0.08)

## 2018-08-01 SURGERY — PACEMAKER IMPLANT

## 2018-08-01 MED ORDER — IRBESARTAN 150 MG PO TABS
300.0000 mg | ORAL_TABLET | Freq: Every day | ORAL | Status: DC
  Administered 2018-08-02 – 2018-08-03 (×2): 300 mg via ORAL
  Filled 2018-08-01 (×3): qty 2

## 2018-08-01 MED ORDER — TAMSULOSIN HCL 0.4 MG PO CAPS
0.4000 mg | ORAL_CAPSULE | Freq: Every day | ORAL | Status: DC
  Administered 2018-08-01 – 2018-08-02 (×2): 0.4 mg via ORAL
  Filled 2018-08-01 (×3): qty 1

## 2018-08-01 MED ORDER — HYDROCHLOROTHIAZIDE 25 MG PO TABS
25.0000 mg | ORAL_TABLET | Freq: Every day | ORAL | Status: DC
  Administered 2018-08-02 – 2018-08-03 (×2): 25 mg via ORAL
  Filled 2018-08-01 (×2): qty 1

## 2018-08-01 MED ORDER — AMLODIPINE BESYLATE 10 MG PO TABS
10.0000 mg | ORAL_TABLET | Freq: Every day | ORAL | Status: DC
  Administered 2018-08-02 – 2018-08-03 (×2): 10 mg via ORAL
  Filled 2018-08-01 (×2): qty 1

## 2018-08-01 MED ORDER — SODIUM CHLORIDE 0.9 % IV SOLN
80.0000 mg | INTRAVENOUS | Status: DC
  Filled 2018-08-01: qty 2

## 2018-08-01 MED ORDER — SODIUM CHLORIDE 0.9 % IV SOLN
INTRAVENOUS | Status: DC
  Administered 2018-08-02: 06:00:00 via INTRAVENOUS

## 2018-08-01 MED ORDER — ALLOPURINOL 300 MG PO TABS
300.0000 mg | ORAL_TABLET | Freq: Every day | ORAL | Status: DC
  Administered 2018-08-02 – 2018-08-03 (×2): 300 mg via ORAL
  Filled 2018-08-01 (×2): qty 1

## 2018-08-01 MED ORDER — VALSARTAN-HYDROCHLOROTHIAZIDE 320-25 MG PO TABS: 1.0000 | ORAL_TABLET | Freq: Every day | ORAL | Status: DC

## 2018-08-01 MED ORDER — CHLORHEXIDINE GLUCONATE 4 % EX LIQD
60.0000 mL | Freq: Once | CUTANEOUS | Status: AC
  Administered 2018-08-02: 4 via TOPICAL
  Filled 2018-08-01: qty 60

## 2018-08-01 MED ORDER — CHLORHEXIDINE GLUCONATE 4 % EX LIQD
60.0000 mL | Freq: Once | CUTANEOUS | Status: DC
  Filled 2018-08-01: qty 60

## 2018-08-01 MED ORDER — SODIUM CHLORIDE 0.45 % IV SOLN: INTRAVENOUS | Status: DC

## 2018-08-01 MED ORDER — CEFAZOLIN SODIUM-DEXTROSE 2-4 GM/100ML-% IV SOLN
2.0000 g | INTRAVENOUS | Status: AC
  Administered 2018-08-02: 2 g via INTRAVENOUS
  Filled 2018-08-01 (×2): qty 100
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
## 2018-08-01 NOTE — Progress Notes (Signed)
Patient's outpatient Holter monitor removed per Dr. Rayann Heman.  Holter monitor placed in plastic bag and placed in patient's shoe with other belongings. Patient instructed to send home with family once the come to visit this evening.

## 2018-08-01 NOTE — Telephone Encounter (Signed)
Received monitor report. Strip reviewed by Dr. Lujean Rave (DOD) who report monitor show high grade AV block. Per DOD she recommends but go to the ED for further evaluations. Hospital DOD Dr. Stanford Breed contacted and agreeable with plan.  Pt advised of DOD recommendations. Pt verbalized understanding and states he will drive himself as he has no one to take him. Pt encouraged to call EMS. Pt refused and states he will be ok. DOD updated and agrees pt show contact EMS or someone else to drive him.   Trish notified.

## 2018-08-01 NOTE — Telephone Encounter (Signed)
Spoke with pt, we got a notice from [employer] about an abnormal ECG. The company reports ventricular standstill and a pause. They had been unable to reach the patient. The event occurred at 6:59 am central time. The patient reports not sleeping well because his wife is having problems. He reports lightheadedness that is not new but no feelings of faintness or syncope. Will wait for the strips.

## 2018-08-01 NOTE — Telephone Encounter (Signed)
New message ° ° ° ° °

## 2018-08-02 ENCOUNTER — Encounter (HOSPITAL_COMMUNITY)
Admission: EM | Disposition: A | Discharge status: Home / Self Care | Payer: Self-pay | Source: Home / Self Care | Attending: Internal Medicine

## 2018-08-02 DIAGNOSIS — M109 Gout, unspecified: Secondary | ICD-10-CM | POA: Diagnosis present

## 2018-08-02 DIAGNOSIS — M1990 Unspecified osteoarthritis, unspecified site: Secondary | ICD-10-CM | POA: Diagnosis present

## 2018-08-02 DIAGNOSIS — Z9884 Bariatric surgery status: Secondary | ICD-10-CM | POA: Diagnosis not present

## 2018-08-02 DIAGNOSIS — Z9842 Cataract extraction status, left eye: Secondary | ICD-10-CM | POA: Diagnosis not present

## 2018-08-02 DIAGNOSIS — Z9119 Patient's noncompliance with other medical treatment and regimen: Secondary | ICD-10-CM | POA: Diagnosis not present

## 2018-08-02 DIAGNOSIS — Z85038 Personal history of other malignant neoplasm of large intestine: Secondary | ICD-10-CM | POA: Diagnosis not present

## 2018-08-02 DIAGNOSIS — C61 Malignant neoplasm of prostate: Secondary | ICD-10-CM | POA: Diagnosis present

## 2018-08-02 DIAGNOSIS — E559 Vitamin D deficiency, unspecified: Secondary | ICD-10-CM | POA: Diagnosis present

## 2018-08-02 DIAGNOSIS — I441 Atrioventricular block, second degree: Secondary | ICD-10-CM | POA: Diagnosis not present

## 2018-08-02 DIAGNOSIS — Z8249 Family history of ischemic heart disease and other diseases of the circulatory system: Secondary | ICD-10-CM | POA: Diagnosis not present

## 2018-08-02 DIAGNOSIS — Z961 Presence of intraocular lens: Secondary | ICD-10-CM | POA: Diagnosis present

## 2018-08-02 DIAGNOSIS — Z91041 Radiographic dye allergy status: Secondary | ICD-10-CM | POA: Diagnosis not present

## 2018-08-02 DIAGNOSIS — I2510 Atherosclerotic heart disease of native coronary artery without angina pectoris: Secondary | ICD-10-CM | POA: Diagnosis present

## 2018-08-02 DIAGNOSIS — Z8719 Personal history of other diseases of the digestive system: Secondary | ICD-10-CM | POA: Diagnosis not present

## 2018-08-02 DIAGNOSIS — Z9841 Cataract extraction status, right eye: Secondary | ICD-10-CM | POA: Diagnosis not present

## 2018-08-02 DIAGNOSIS — I10 Essential (primary) hypertension: Secondary | ICD-10-CM | POA: Diagnosis present

## 2018-08-02 DIAGNOSIS — E538 Deficiency of other specified B group vitamins: Secondary | ICD-10-CM | POA: Diagnosis present

## 2018-08-02 DIAGNOSIS — Z823 Family history of stroke: Secondary | ICD-10-CM | POA: Diagnosis not present

## 2018-08-02 DIAGNOSIS — Z87891 Personal history of nicotine dependence: Secondary | ICD-10-CM | POA: Diagnosis not present

## 2018-08-02 DIAGNOSIS — G4733 Obstructive sleep apnea (adult) (pediatric): Secondary | ICD-10-CM | POA: Diagnosis present

## 2018-08-02 DIAGNOSIS — I442 Atrioventricular block, complete: Secondary | ICD-10-CM | POA: Diagnosis present

## 2018-08-02 DIAGNOSIS — E785 Hyperlipidemia, unspecified: Secondary | ICD-10-CM | POA: Diagnosis present

## 2018-08-02 DIAGNOSIS — K5730 Diverticulosis of large intestine without perforation or abscess without bleeding: Secondary | ICD-10-CM | POA: Diagnosis present

## 2018-08-02 DIAGNOSIS — Z96652 Presence of left artificial knee joint: Secondary | ICD-10-CM | POA: Diagnosis present

## 2018-08-02 DIAGNOSIS — Z803 Family history of malignant neoplasm of breast: Secondary | ICD-10-CM | POA: Diagnosis not present

## 2018-08-02 HISTORY — PX: PACEMAKER IMPLANT: EP1218

## 2018-08-02 LAB — BASIC METABOLIC PANEL
Anion gap: 7 (ref 5–15)
BUN: 24 mg/dL — ABNORMAL HIGH (ref 8–23)
CO2: 27 mmol/L (ref 22–32)
Calcium: 8.9 mg/dL (ref 8.9–10.3)
Chloride: 107 mmol/L (ref 98–111)
Creatinine, Ser: 1.18 mg/dL (ref 0.61–1.24)
GFR calc Af Amer: >60 mL/min (ref >60)
GFR calc non Af Amer: 59 mL/min — ABNORMAL LOW (ref >60)
Glucose, Bld: 111 mg/dL — ABNORMAL HIGH (ref 70–99)
Potassium: 4 mmol/L (ref 3.5–5.1)
Sodium: 141 mmol/L (ref 135–145)

## 2018-08-02 LAB — CBC
HCT: 38.6 % — ABNORMAL LOW (ref 39.0–52.0)
Hemoglobin: 12.5 g/dL — ABNORMAL LOW (ref 13.0–17.0)
MCH: 29.3 pg (ref 26.0–34.0)
MCHC: 32.4 g/dL (ref 30.0–36.0)
MCV: 90.4 fL (ref 80.0–100.0)
Platelets: 192 10*3/uL (ref 150–400)
RBC: 4.27 MIL/uL (ref 4.22–5.81)
RDW: 14.1 % (ref 11.5–15.5)
WBC: 5.6 10*3/uL (ref 4.0–10.5)
nRBC: 0 % (ref 0.0–0.2)

## 2018-08-02 LAB — SURGICAL PCR SCREEN
MRSA, PCR: NEGATIVE
Staphylococcus aureus: NEGATIVE

## 2018-08-02 SURGERY — PACEMAKER IMPLANT

## 2018-08-02 MED ORDER — HEPARIN (PORCINE) IN NACL 1000-0.9 UT/500ML-% IV SOLN
INTRAVENOUS | Status: AC
  Filled 2018-08-02: qty 500

## 2018-08-02 MED ORDER — MIDAZOLAM HCL 5 MG/5ML IJ SOLN
INTRAMUSCULAR | Status: AC
  Filled 2018-08-02: qty 5

## 2018-08-02 MED ORDER — SODIUM CHLORIDE 0.9 % IV SOLN
INTRAVENOUS | Status: AC
  Filled 2018-08-02: qty 2

## 2018-08-02 MED ORDER — LIDOCAINE HCL 1 % IJ SOLN
INTRAMUSCULAR | Status: AC
  Filled 2018-08-02: qty 60

## 2018-08-02 MED ORDER — ACETAMINOPHEN 325 MG PO TABS
650.0000 mg | ORAL_TABLET | Freq: Four times a day (QID) | ORAL | Status: DC | PRN
  Administered 2018-08-02: 650 mg via ORAL
  Filled 2018-08-02: qty 2

## 2018-08-02 MED ORDER — FENTANYL CITRATE (PF) 100 MCG/2ML IJ SOLN
INTRAMUSCULAR | Status: DC | PRN
  Administered 2018-08-02 (×3): 12.5 ug via INTRAVENOUS

## 2018-08-02 MED ORDER — SODIUM CHLORIDE 0.9 % IV SOLN
INTRAVENOUS | Status: AC | PRN
  Administered 2018-08-02: 75 mL via INTRAVENOUS

## 2018-08-02 MED ORDER — ONDANSETRON HCL 4 MG/2ML IJ SOLN: 4.0000 mg | Freq: Four times a day (QID) | INTRAMUSCULAR | Status: DC | PRN

## 2018-08-02 MED ORDER — OXYMETAZOLINE HCL 0.05 % NA SOLN
1.0000 | Freq: Every day | NASAL | Status: DC
  Administered 2018-08-02 (×2): 1 via NASAL
  Filled 2018-08-02: qty 15

## 2018-08-02 MED ORDER — FENTANYL CITRATE (PF) 100 MCG/2ML IJ SOLN
INTRAMUSCULAR | Status: AC
  Filled 2018-08-02: qty 2

## 2018-08-02 MED ORDER — CEFAZOLIN SODIUM-DEXTROSE 1-4 GM/50ML-% IV SOLN
1.0000 g | Freq: Four times a day (QID) | INTRAVENOUS | Status: AC
  Administered 2018-08-02 – 2018-08-03 (×3): 1 g via INTRAVENOUS
  Filled 2018-08-02 (×3): qty 50

## 2018-08-02 MED ORDER — ACETAMINOPHEN 325 MG PO TABS: 325.0000 mg | ORAL_TABLET | ORAL | Status: DC | PRN

## 2018-08-02 MED ORDER — MIDAZOLAM HCL 5 MG/5ML IJ SOLN
INTRAMUSCULAR | Status: DC | PRN
  Administered 2018-08-02 (×3): 1 mg via INTRAVENOUS

## 2018-08-02 SURGICAL SUPPLY — 8 items
CABLE SURGICAL S-101-97-12 (CABLE) ×2 IMPLANT
HOVERMATT SINGLE USE (MISCELLANEOUS) ×1 IMPLANT
LEAD SOLIA S PRO MRI 53 (Lead) ×1 IMPLANT
LEAD SOLIA S PRO MRI 60 (Lead) ×1 IMPLANT
PACEMAKER EDORA 8DR-T MRI (Pacemaker) ×1 IMPLANT
PAD PRO RADIOLUCENT 2001M-C (PAD) ×2 IMPLANT
SHEATH CLASSIC 7F (SHEATH) ×2 IMPLANT
TRAY PACEMAKER INSERTION (PACKS) ×2 IMPLANT

## 2018-08-02 NOTE — Progress Notes (Signed)
Progress Note  Patient Name: Marcus [name] Date of Encounter: 08/02/2018  Primary Cardiologist: Kirk Ruths  Subjective   No chest pain or sob.   Inpatient Medications    Scheduled Meds: . allopurinol  300 mg Oral Daily  . amLODipine  10 mg Oral Daily  . chlorhexidine  60 mL Topical Once  . gentamicin irrigation  80 mg Irrigation On Call  . hydrochlorothiazide  25 mg Oral Daily  . irbesartan  300 mg Oral Daily  . oxymetazoline  1 spray Each Nare QHS  . tamsulosin  0.4 mg Oral Daily   Continuous Infusions: . sodium chloride    . sodium chloride 50 mL/hr at 08/02/18 0626  .  ceFAZolin (ANCEF) IV     PRN Meds:    Vital Signs    Vitals:   08/01/18 1745 08/01/18 1838 08/01/18 2055 08/02/18 0620  BP: 138/73 93/62 138/76 (!) 144/76  Pulse: (!) 53 (!) 51 (!) 56 (!) 51  Resp: 16  18 16   Temp:  98.7 F (37.1 C) 97.8 F (36.6 C) 98.3 F (36.8 C)  TempSrc:  Oral Oral Oral  SpO2: 95% 100% 95% 96%  Weight:  112.3 kg  111.4 kg  Height:  5\' 9"  (1.753 m)      Intake/Output Summary (Last 24 hours) at 08/02/2018 1010 Last data filed at 08/01/2018 2141 Gross per 24 hour  Intake 240 ml  Output -  Net 240 ml   Filed Weights   08/01/18 1838 08/02/18 0175  Weight: 112.3 kg 111.4 kg    Telemetry    nsr with intermittent mobitz 2, second degree AV block - Personally Reviewed  ECG    nsr - Personally Reviewed  Physical Exam   GEN: No acute distress.   Neck: No JVD Cardiac: RRR, no murmurs, rubs, or gallops.  Respiratory: Clear to auscultation bilaterally. GI: Soft, nontender, non-distended  MS: No edema; No deformity. Neuro:  Nonfocal  Psych: Normal affect   Labs    Chemistry Recent Labs  Lab 08/01/18 1506 08/02/18 0605  NA 141 141  K 3.7 4.0  CL 105 107  CO2 25 27  GLUCOSE 111* 111*  BUN 23 24*  CREATININE 1.27* 1.18  CALCIUM 9.2 8.9  GFRNONAA 54* 59*  GFRAA >60 >60  ANIONGAP 11 7     Hematology Recent Labs  Lab 08/01/18 1506  08/02/18 0605  WBC 5.7 5.6  RBC 4.69 4.27  HGB 13.6 12.5*  HCT 43.4 38.6*  MCV 92.5 90.4  MCH 29.0 29.3  MCHC 31.3 32.4  RDW 14.2 14.1  PLT 198 192    Cardiac EnzymesNo results for input(s): TROPONINI in the last 168 hours.  Recent Labs  Lab 08/01/18 1507  TROPIPOC 0.01     BNPNo results for input(s): BNP, PROBNP in the last 168 hours.   DDimer No results for input(s): DDIMER in the last 168 hours.   Radiology    No results found.  Cardiac Studies   none  Patient Profile     78 y.o. male admitted with a h/o syncope and transient mobitz 2 second degree AV block  Assessment & Plan    1. Syncope - likely due to heart block. On no AV nodal blocking drugs. 2. Mobitz 2 second degree AV block - I have discussed the indications/risks/benefits/goals/expectations of PPM insertion and he wishes to proceed.  For questions or updates, please contact [place] Please consult [email or web address] for contact info under Cardiology/STEMI.  Signed, Cristopher Peru, MD  08/02/2018, 10:10 AM  Patient ID: Marcus [name], male   DOB: 1941/05/24, 78 y.o.   MRN: [id number]

## 2018-08-02 NOTE — Discharge Instructions (Signed)
° ° °  Supplemental Discharge Instructions for  Pacemaker/Defibrillator Patients  Activity No heavy lifting or vigorous activity with your left/right arm for 6 to 8 weeks.  Do not raise your left/right arm above your head for one week.  Gradually raise your affected arm as drawn below.              08/06/2018                  08/07/2018                  08/08/2018                 08/09/2018 __  NO DRIVING for  1 week   ; you may begin driving on  09/05/3830   .  WOUND CARE - Keep the wound area clean and dry.  Do not get this area wet for one week. No showers for one week; you may shower on  08/09/2018   . - The tape/steri-strips on your wound will fall off; do not pull them off.  No bandage is needed on the site.  DO  NOT apply any creams, oils, or ointments to the wound area. - If you notice any drainage or discharge from the wound, any swelling or bruising at the site, or you develop a fever > 101? F after you are discharged home, call the office at once.  Special Instructions - You are still able to use cellular telephones; use the ear opposite the side where you have your pacemaker/defibrillator.  Avoid carrying your cellular phone near your device. - When traveling through airports, show security personnel your identification card to avoid being screened in the metal detectors.  Ask the security personnel to use the hand wand. - Avoid arc welding equipment, MRI testing (magnetic resonance imaging), TENS units (transcutaneous nerve stimulators).  Call the office for questions about other devices. - Avoid electrical appliances that are in poor condition or are not properly grounded. - Microwave ovens are safe to be near or to operate.

## 2018-08-02 NOTE — Progress Notes (Signed)
Telemetry called to notify RN of patient's heart rate dropping to 29 and patient also had a 2.15 second pause.  RN into check on patient.  Patient easily arouseable, asymptomatic.  Patient complaining of a stuffy nose.  Per patient uses nasal spray at home at bedtime.  RN checked prior to admission medication list and Afrin listed.  RN text paged Cardiology requesting  Afrin.

## 2018-08-02 NOTE — Discharge Summary (Addendum)
ELECTROPHYSIOLOGY PROCEDURE DISCHARGE SUMMARY    Patient ID: Marcus [name],  MRN: [id number], DOB/AGE: 01-31-1941 78 y.o.  Admit date: 08/01/2018 Discharge date: 08/03/2018  Primary Care Physician: Dorothyann Peng, NP  Primary Cardiologist: Dr. Stanford Breed Electrophysiologist: Dr. Lovena Le (new)  Primary Discharge Diagnosis:  1. CHB 2. H/o syncope  Secondary Discharge Diagnosis:  1. OSA      Does no use CPAP 2. HLD 3. Prostate ca  Allergies  Allergen Reactions  . Contrast Media [Iodinated Diagnostic Agents] Palpitations    TACHYCARDIA- patient states he has tolerated newer agents since this reaction >30 yrs ago     Procedures This Admission:  1.  Implantation of a Biotronik dual chamber PPM on 08/02/2018 by Dr Lovena Le.  The patient received a Biotronik (serial number [id number]) right atrial lead and a Biotronik (serial number [id number]) right ventricular lead, Biotronik (serial number [id number]) pacemaker  There were no immediate post procedure complications. 2.  CXR on 08/03/2018 demonstrated no pneumothorax status post device implantation.   Brief HPI: Marcus [name] is a 78 y.o. male was referred to the ER from the office with findings of CHB on an ebent monitor done for recent h/o syncope  Hospital Course:  The patient was admitted, no reversible cause for his CHB were found and underwent implantation of a PPM the following day with details as outlined above.  He was monitored on telemetry throughout his stay which demonstrated SR, occ A pacing.  Left chest was without hematoma or ecchymosis.  The device was interrogated and found to be functioning normally.  CXR was obtained and demonstrated no pneumothorax status post device implantation.  Wound care, arm mobility, and restrictions were reviewed with the patient.  The patient feels well, denies any CP or SOB, he was examined by Dr. Lovena Le and considered stable for discharge to home.    Physical Exam: Vitals:   08/02/18  1825 08/02/18 2026 08/03/18 0236 08/03/18 0550  BP: 115/84 124/90  137/75  Pulse: 60 74  60  Resp: 15 16    Temp: 98 F (36.7 C) 98 F (36.7 C)  98.4 F (36.9 C)  TempSrc: Oral Oral  Oral  SpO2: 98% 94%  93%  Weight:   110.3 kg 110.1 kg  Height:        GEN- The patient is well appearing, alert and oriented x 3 today.   HEENT: normocephalic, atraumatic; sclera clear, conjunctiva pink; hearing intact; oropharynx clear; neck supple, no JVP Lungs- CTA b/l, normal work of breathing.  No wheezes, rales, rhonchi Heart-  RRR, no murmurs, rubs or gallops, PMI not laterally displaced GI- soft, non-tender, non-distended Extremities- no clubbing, cyanosis, or edema MS- no significant deformity or atrophy Skin- warm and dry, no rash or lesion,  left chest without hematoma/ecchymosis Psych- euthymic mood, full affect Neuro- no gross deficits   Labs:   Lab Results  Component Value Date   WBC 5.6 08/02/2018   HGB 12.5 (L) 08/02/2018   HCT 38.6 (L) 08/02/2018   MCV 90.4 08/02/2018   PLT 192 08/02/2018    Recent Labs  Lab 08/02/18 0605  NA 141  K 4.0  CL 107  CO2 27  BUN 24*  CREATININE 1.18  CALCIUM 8.9  GLUCOSE 111*    Discharge Medications:  Allergies as of 08/03/2018      Reactions   Contrast Media [iodinated Diagnostic Agents] Palpitations   TACHYCARDIA- patient states he has tolerated newer agents since this reaction >30 yrs ago  Medication List    TAKE these medications   AFRIN NASAL SPRAY NA Place 1 Squirt into the nose at bedtime.   allopurinol 300 MG tablet Commonly known as:  ZYLOPRIM Take 1 tablet (300 mg total) by mouth daily.   amLODipine 10 MG tablet Commonly known as:  NORVASC Take 1 tablet (10 mg total) by mouth daily.   IRON (FERROUS SULFATE) PO Take 1 tablet by mouth daily.   magnesium oxide 400 MG tablet Commonly known as:  MAG-OX Take 400 mg by mouth daily.   metoCLOPramide 10 MG tablet Commonly known as:  REGLAN Take 1 tablet (10  mg total) by mouth at bedtime as needed for nausea. Try half-tablet to start -   multivitamin with minerals tablet Take 1 tablet by mouth daily.   omeprazole 40 MG capsule Commonly known as:  PRILOSEC TAKE ONE CAPSULE BY MOUTH ONE TIME DAILY BEFORE MEAL What changed:    how much to take  how to take this  when to take this  additional instructions   simvastatin 20 MG tablet Commonly known as:  ZOCOR TAKE 1 TABLET AT BEDTIME   STOOL SOFTENER PO Take 1 tablet by mouth daily.   tamsulosin 0.4 MG Caps capsule Commonly known as:  FLOMAX Take 0.4 mg by mouth daily.   valsartan-hydrochlorothiazide 320-25 MG tablet Commonly known as:  DIOVAN-HCT Take 1 tablet by mouth daily.   Vitamin B-12 5000 MCG Tbdp Take 1 tablet by mouth daily.   Vitamin D 50 MCG (2000 UT) Caps Take 1 capsule by mouth daily.       Disposition:  Home Discharge Instructions    Diet - low sodium heart healthy   Complete by:  As directed    Increase activity slowly   Complete by:  As directed      Follow-up Information    [facility] Follow up.   Specialty:  Cardiology Why:  08/14/2018 @ 11:20AM, wound check visit Contact information: [address] [place]       Evans Lance, MD Follow up.   Specialty:  Cardiology Why:  11/02/2018 @ 2:45PM Contact information: [address] [address] [phone]           Duration of Discharge Encounter: Greater than 30 minutes including physician time.  Venetia Night, PA-C 08/03/2018 10:57 AM  EP Attending  Patient seen and examined. Agree with above. The patient has undergone DDD PM insertion. Interrogation of her PPM under my direct supervision demonstrates normal device function. His CXR looks good. He will be discharged home with usual followup.   Mikle Bosworth.D.

## 2018-08-03 ENCOUNTER — Inpatient Hospital Stay (HOSPITAL_COMMUNITY): Payer: Medicare HMO

## 2018-08-03 ENCOUNTER — Encounter (HOSPITAL_COMMUNITY): Payer: Self-pay | Admitting: Internal Medicine

## 2018-08-03 NOTE — Progress Notes (Signed)
Orthopedic Tech Progress Note Patient Details:  Marcus [name] 1941/03/24 [id number]  RN said patient has arm sling  Patient ID: Marcus [name], male   DOB: [birth year], 78 y.o.   MRN: [id number]   Marcus [name] 08/03/2018, 7:45 AM

## 2018-08-04 ENCOUNTER — Encounter (HOSPITAL_COMMUNITY): Payer: Medicare HMO

## 2018-08-05 ENCOUNTER — Telehealth: Payer: Self-pay | Admitting: Nurse Practitioner

## 2018-08-05 NOTE — Telephone Encounter (Signed)
   Pt s/p PPM earlier in the week.  He called because he says he has been paranoid about what to do with his remote monitoring device.  He thinks that he should take it with him everywhere he goes just in case something happens.  I offered him reassurance that there should not be a need for him to carry his remote monitoring device everywhere he goes.  We expect his pacemaker to work normally as it was functioning normally following his procedure.  His remote device is in place simply to allow for remote checks.  He said his mind was now at ease.  Murray Hodgkins, NP 08/05/2018, 11:00 AM

## 2018-08-07 ENCOUNTER — Other Ambulatory Visit: Payer: Self-pay

## 2018-08-07 ENCOUNTER — Other Ambulatory Visit: Payer: Self-pay | Admitting: Cardiology

## 2018-08-07 NOTE — Patient Outreach (Signed)
[facility]) Care Management  08/07/2018  Marcus [name] 1941/01/16 [id number]   Discharge  08/03/2018.  Referral received. No outreach warranted at this time. Transition of Care  will be completed by primary care provider office who will refer to [facility] care management if needed.  Plan: RN CM will close case.  Jone Baseman, RN, MSN [facility] Care Management Coordinator Direct Line [phone] Cell [phone] Toll Free: [phone]  Fax: [phone]

## 2018-08-08 ENCOUNTER — Encounter: Payer: Self-pay | Admitting: Adult Health

## 2018-08-08 ENCOUNTER — Ambulatory Visit (INDEPENDENT_AMBULATORY_CARE_PROVIDER_SITE_OTHER): Payer: Medicare HMO | Admitting: Adult Health

## 2018-08-08 VITALS — BP 148/72 | HR 65 | Temp 98.3°F | Wt 249.0 lb

## 2018-08-08 DIAGNOSIS — J0140 Acute pansinusitis, unspecified: Secondary | ICD-10-CM | POA: Diagnosis not present

## 2018-08-08 MED ORDER — DOXYCYCLINE HYCLATE 100 MG PO CAPS: 100.0000 mg | ORAL_CAPSULE | Freq: Two times a day (BID) | ORAL | 0 refills | Status: DC

## 2018-08-08 MED ORDER — FLUTICASONE PROPIONATE 50 MCG/ACT NA SUSP: 2.0000 | Freq: Every day | NASAL | 6 refills | Status: DC

## 2018-08-08 NOTE — Progress Notes (Signed)
Subjective:    Patient ID: Marcus [name], male    DOB: October 03, 1940, 78 y.o.   MRN: [id number]  Sinusitis  This is a new problem. The current episode started in the past 7 days. The problem has been waxing and waning since onset. There has been no fever. Associated symptoms include coughing (productive ), headaches and sinus pressure. Pertinent negatives include no chills, ear pain, hoarse voice, neck pain, shortness of breath or sore throat. Past treatments include nothing.      Review of Systems  Constitutional: Positive for activity change, appetite change and fatigue. Negative for chills and fever.  HENT: Positive for postnasal drip, rhinorrhea, sinus pressure and sinus pain. Negative for ear pain, hoarse voice, sore throat and trouble swallowing.   Respiratory: Positive for cough (productive ). Negative for chest tightness, shortness of breath and wheezing.   Cardiovascular: Negative.   Genitourinary: Negative.   Musculoskeletal: Positive for arthralgias. Negative for neck pain.  Neurological: Positive for headaches.  Hematological: Negative.   Psychiatric/Behavioral: Negative.    Past Medical History:  Diagnosis Date  . Acute meniscal tear of knee left  . Arthritis    generalized  . Calcaneal spur   . Colon cancer ([place]) 1983  . Coronary artery disease    dr Stanford Breed  . Diverticulosis of colon (without mention of hemorrhage)   . Echocardiogram findings abnormal, without diagnosis 04-23-2010   normal LV function, mod. left atrial enlargement, mild right artrial enlargement and grade 1 diastolic dysfunction  . Edema of lower extremity    ankles, elevates legs  . Elevated prostate specific antigen (PSA)   . Esophageal reflux   . External hemorrhoids   . Frequency of urination    followed by dr dalhstedt  . Gout   . Gout, unspecified   . Hiatal hernia   . History of colon cancer 1988  . Hyperlipidemia   . Lumbar spondylolysis   . Normal nuclear stress test 04-23-2010     ef 51%,  septal hypokinesis, normal perfusion  . OSA (obstructive sleep apnea)    does not use CPAP   . Paraesophageal hernia    large  . Personal history of colonic polyps 08/21/2009   TUBULAR ADENOMAS  . Pneumonia    history of; last episode 2015  . Postgastric surgery syndromes   . Prostate cancer ([place]) 2016  . Unspecified essential hypertension   . Unspecified vitamin D deficiency   . Ventral hernia   . Vitamin B12 deficiency     Social History   Socioeconomic History  . Marital status: Married    Spouse name: Not on file  . Number of children: 4  . Years of education: Not on file  . Highest education level: Not on file  Occupational History  . Occupation: retired    Fish farm manager: [employer]  . Financial resource strain: Not on file  . Food insecurity:    Worry: Not on file    Inability: Not on file  . Transportation needs:    Medical: Not on file    Non-medical: Not on file  Tobacco Use  . Smoking status: Former Smoker    Packs/day: 2.00    Years: 20.00    Pack years: 40.00    Types: Cigarettes    Last attempt to quit: 07/05/1974    Years since quitting: 44.1  . Smokeless tobacco: Never Used  Substance and Sexual Activity  . Alcohol use: Yes    Alcohol/week: 0.0  standard drinks    Comment: socially; also 1-2 glasses of wine or beer every other day   . Drug use: No  . Sexual activity: Not on file  Lifestyle  . Physical activity:    Days per week: Not on file    Minutes per session: Not on file  . Stress: Not on file  Relationships  . Social connections:    Talks on phone: Not on file    Gets together: Not on file    Attends religious service: Not on file    Active member of club or organization: Not on file    Attends meetings of clubs or organizations: Not on file    Relationship status: Not on file  . Intimate partner violence:    Fear of current or ex partner: Not on file    Emotionally abused: Not on file    Physically abused:  Not on file    Forced sexual activity: Not on file  Other Topics Concern  . Not on file  Social History Narrative   Married   Former Smoker    Alcohol use-yes 1-2 drinks per day      Occupation: Retired Midwife      Originally from [place], [place] - in [place] > 10 yrs as of 2017       Past Surgical History:  Procedure Laterality Date  . CARDIAC CATHETERIZATION    . CATARACT EXTRACTION W/ INTRAOCULAR LENS  IMPLANT, BILATERAL Bilateral   . COLONOSCOPY    . ESOPHAGOGASTRODUODENOSCOPY    . KNEE ARTHROSCOPY Right 2007  . KNEE ARTHROSCOPY  07/13/2011   Procedure: ARTHROSCOPY KNEE;  Surgeon: Cynda Familia;  Location: [facility];  Service: Orthopedics;  Laterality: Left;  partial menisectomy with chondrylplasty  . KNEE SURGERY Left   . LAMINECTOMY AND MICRODISCECTOMY LUMBAR SPINE  MARCH  2008   L3 -  4  . LAPAROSCOPIC INCISIONAL / UMBILICAL / VENTRAL HERNIA REPAIR  2006  . PACEMAKER IMPLANT N/A 08/02/2018   Procedure: PACEMAKER IMPLANT;  Surgeon: Evans Lance, MD;  Location: [facility];  Service: Cardiovascular;  Laterality: N/A;  . PROSTATE BIOPSY    . SHOULDER ARTHROSCOPY Right 05-23-2007  . SIGMOID COLECTOMY FOR CANCER  1989  . TOTAL KNEE ARTHROPLASTY Left 05/23/2015   Procedure: LEFT TOTAL KNEE ARTHROPLASTY;  Surgeon: Sydnee Cabal, MD;  Location: [facility];  Service: Orthopedics;  Laterality: Left;  [name] Kitchen VERTICAL BANDED GASTROPLASTY  1986    Family History  Problem Relation Age of Onset  . Stroke Paternal Grandfather   . Heart disease Paternal Grandfather   . Esophagitis Father        died from perforated esophagus  . Breast cancer Mother   . Colon cancer Maternal Grandmother        questionable    Allergies  Allergen Reactions  . Contrast Media [Iodinated Diagnostic Agents] Palpitations    TACHYCARDIA- patient states he has tolerated newer agents since this reaction >30 yrs ago    Current Outpatient Medications on File Prior to Visit    Medication Sig Dispense Refill  . allopurinol (ZYLOPRIM) 300 MG tablet Take 1 tablet (300 mg total) by mouth daily. 90 tablet 0  . amLODipine (NORVASC) 10 MG tablet Take 1 tablet (10 mg total) by mouth daily. 90 tablet 0  . Cholecalciferol (VITAMIN D) 2000 UNITS CAPS Take 1 capsule by mouth daily.    . Cyanocobalamin (VITAMIN B-12) 5000 MCG TBDP Take 1 tablet by mouth daily.     [name] Kitchen  Docusate Calcium (STOOL SOFTENER PO) Take 1 tablet by mouth daily.    . IRON, FERROUS SULFATE, PO Take 1 tablet by mouth daily.    . magnesium oxide (MAG-OX) 400 MG tablet Take 400 mg by mouth daily.    . metoCLOPramide (REGLAN) 10 MG tablet Take 1 tablet (10 mg total) by mouth at bedtime as needed for nausea. Try half-tablet to start - 90 tablet 1  . Multiple Vitamins-Minerals (MULTIVITAMIN WITH MINERALS) tablet Take 1 tablet by mouth daily.    [name] Kitchen omeprazole (PRILOSEC) 40 MG capsule TAKE ONE CAPSULE BY MOUTH ONE TIME DAILY BEFORE MEAL (Patient taking differently: Take 40 mg by mouth daily. ) 90 capsule 0  . Oxymetazoline HCl (AFRIN NASAL SPRAY NA) Place 1 Squirt into the nose at bedtime.    . simvastatin (ZOCOR) 20 MG tablet TAKE 1 TABLET AT BEDTIME (Patient taking differently: Take 20 mg by mouth at bedtime. ) 90 tablet 1  . tamsulosin (FLOMAX) 0.4 MG CAPS capsule Take 0.4 mg by mouth daily.     . valsartan-hydrochlorothiazide (DIOVAN-HCT) 320-25 MG tablet Take 1 tablet by mouth daily. 90 tablet 0   No current facility-administered medications on file prior to visit.     BP (!) 148/72   Pulse 65   Temp 98.3 F (36.8 C)   Wt 249 lb (112.9 kg)   SpO2 98%   BMI 36.77 kg/m       Objective:   Physical Exam Vitals signs and nursing note reviewed.  Constitutional:      Appearance: He is normal weight.  HENT:     Head: Normocephalic.     Right Ear: Tympanic membrane, ear canal and external ear normal.     Left Ear: Tympanic membrane, ear canal and external ear normal.     Nose: Mucosal edema, congestion  and rhinorrhea present.     Right Sinus: Maxillary sinus tenderness and frontal sinus tenderness present.     Left Sinus: Maxillary sinus tenderness and frontal sinus tenderness present.     Mouth/Throat:     Mouth: Mucous membranes are moist.     Pharynx: Oropharyngeal exudate present.  Eyes:     Extraocular Movements: Extraocular movements intact.     Pupils: Pupils are equal, round, and reactive to light.  Neck:     Musculoskeletal: Normal range of motion and neck supple.  Cardiovascular:     Rate and Rhythm: Normal rate and regular rhythm.     Pulses: Normal pulses.     Heart sounds: Normal heart sounds.  Pulmonary:     Effort: Pulmonary effort is normal.     Breath sounds: Normal breath sounds.  Musculoskeletal: Normal range of motion.  Skin:    General: Skin is warm and dry.     Capillary Refill: Capillary refill takes less than 2 seconds.  Neurological:     General: No focal deficit present.     Mental Status: He is alert.       Assessment & Plan:  1. Acute non-recurrent pansinusitis - doxycycline (VIBRAMYCIN) 100 MG capsule; Take 1 capsule (100 mg total) by mouth 2 (two) times daily.  Dispense: 14 capsule; Refill: 0 - fluticasone (FLONASE) 50 MCG/ACT nasal spray; Place 2 sprays into both nostrils daily.  Dispense: 16 g; Refill: 6 - Follow up if no improvement in the next 2-3 days   [employer]

## 2018-08-14 ENCOUNTER — Ambulatory Visit (INDEPENDENT_AMBULATORY_CARE_PROVIDER_SITE_OTHER): Payer: Medicare HMO | Admitting: Nurse Practitioner

## 2018-08-14 DIAGNOSIS — I495 Sick sinus syndrome: Secondary | ICD-10-CM | POA: Diagnosis not present

## 2018-08-14 LAB — CUP PACEART INCLINIC DEVICE CHECK
Date Time Interrogation Session: 20200210112322
Implantable Lead Implant Date: 20200129
Implantable Lead Implant Date: 20200129
Implantable Lead Location: 753859
Implantable Lead Location: 753860
Implantable Lead Model: 377
Implantable Lead Model: 377
Implantable Lead Serial Number: 80947537
Implantable Lead Serial Number: 80970966
Implantable Pulse Generator Implant Date: 20200129
Pulse Gen Model: 407145
Pulse Gen Serial Number: 69503797

## 2018-08-14 NOTE — Progress Notes (Signed)

## 2018-08-17 NOTE — ED Provider Notes (Signed)
[facility] PROGRESSIVE CARE Provider Note   CSN: [id number] Arrival date & time: 08/01/18  1435     History   Chief Complaint Chief Complaint  Patient presents with  . Dizziness    HPI RAEQUAN VANSCHAICK is a 78 y.o. male.  HPI Patient summoned to the emergency department by cardiology for abnormal heart monitor readings.  Patient has had prior syncopal episodes. Past Medical History:  Diagnosis Date  . Acute meniscal tear of knee left  . Arthritis    generalized  . Calcaneal spur   . Colon cancer ([place]) 1983  . Coronary artery disease    dr Stanford Breed  . Diverticulosis of colon (without mention of hemorrhage)   . Echocardiogram findings abnormal, without diagnosis 04-23-2010   normal LV function, mod. left atrial enlargement, mild right artrial enlargement and grade 1 diastolic dysfunction  . Edema of lower extremity    ankles, elevates legs  . Elevated prostate specific antigen (PSA)   . Esophageal reflux   . External hemorrhoids   . Frequency of urination    followed by dr dalhstedt  . Gout   . Gout, unspecified   . Hiatal hernia   . History of colon cancer 1988  . Hyperlipidemia   . Lumbar spondylolysis   . Normal nuclear stress test 04-23-2010   ef 51%,  septal hypokinesis, normal perfusion  . OSA (obstructive sleep apnea)    does not use CPAP   . Paraesophageal hernia    large  . Personal history of colonic polyps 08/21/2009   TUBULAR ADENOMAS  . Pneumonia    history of; last episode 2015  . Postgastric surgery syndromes   . Prostate cancer ([place]) 2016  . Unspecified essential hypertension   . Unspecified vitamin D deficiency   . Ventral hernia   . Vitamin B12 deficiency     Patient Active Problem List   Diagnosis Date Noted  . Syncope 08/01/2018  . AV block 08/01/2018  . Depression, recurrent ([place]) 12/23/2017  . Primary osteoarthritis of knee 05/23/2015  . S/P knee replacement 05/23/2015  . Chronic cough 10/07/2014  . Chest pain, atypical  08/23/2014  . Elevated PSA 06/19/2014  . Preventative health care 06/19/2014  . Temporal headache 09/26/2013  . Adjustment disorder with depressed mood 09/26/2013  . Osteoarthritis of left knee 03/12/2013  . Lumbar strain 12/14/2011  . Skin lesion of face 07/23/2011  . Lump in chest 03/31/2011  . Iron deficiency anemia, unspecified  01/19/2011  . Hemorrhoids 01/19/2011  . Bradycardia 01/08/2011  . UNSPECIFIED VITAMIN D DEFICIENCY 10/28/2009  . CALCANEAL SPUR 03/31/2009  . Hyperlipidemia 07/26/2008  . Obesity 10/17/2007  . INSOMNIA UNSPECIFIED 08/21/2007  . GOUT NOS 01/18/2007  . SLEEP APNEA, OBSTRUCTIVE 01/18/2007  . Essential hypertension 01/18/2007  . GERD 01/18/2007  . COLON CANCER, HX OF 01/18/2007  . COLONIC POLYPS, HX OF 01/18/2007  . HIATAL HERNIA 10/26/2006  . DIVERTICULOSIS, COLON 07/07/2005    Past Surgical History:  Procedure Laterality Date  . CARDIAC CATHETERIZATION    . CATARACT EXTRACTION W/ INTRAOCULAR LENS  IMPLANT, BILATERAL Bilateral   . COLONOSCOPY    . ESOPHAGOGASTRODUODENOSCOPY    . KNEE ARTHROSCOPY Right 2007  . KNEE ARTHROSCOPY  07/13/2011   Procedure: ARTHROSCOPY KNEE;  Surgeon: Cynda Familia;  Location: [facility];  Service: Orthopedics;  Laterality: Left;  partial menisectomy with chondrylplasty  . KNEE SURGERY Left   . LAMINECTOMY AND MICRODISCECTOMY LUMBAR SPINE  MARCH  2008   L3 -  4  . LAPAROSCOPIC INCISIONAL / UMBILICAL / VENTRAL HERNIA REPAIR  2006  . PACEMAKER IMPLANT N/A 08/02/2018   Procedure: PACEMAKER IMPLANT;  Surgeon: Evans Lance, MD;  Location: [facility];  Service: Cardiovascular;  Laterality: N/A;  . PROSTATE BIOPSY    . SHOULDER ARTHROSCOPY Right 05-23-2007  . SIGMOID COLECTOMY FOR CANCER  1989  . TOTAL KNEE ARTHROPLASTY Left 05/23/2015   Procedure: LEFT TOTAL KNEE ARTHROPLASTY;  Surgeon: Sydnee Cabal, MD;  Location: [facility];  Service: Orthopedics;  Laterality: Left;  [name] Kitchen VERTICAL BANDED  GASTROPLASTY  1986        Home Medications    Prior to Admission medications   Medication Sig Start Date End Date Taking? Authorizing Provider  allopurinol (ZYLOPRIM) 300 MG tablet Take 1 tablet (300 mg total) by mouth daily. 12/01/17  Yes Nafziger, Tommi Rumps, NP  amLODipine (NORVASC) 10 MG tablet Take 1 tablet (10 mg total) by mouth daily. 12/01/17  Yes Nafziger, Tommi Rumps, NP  Cholecalciferol (VITAMIN D) 2000 UNITS CAPS Take 1 capsule by mouth daily.   Yes [provider]  Cyanocobalamin (VITAMIN B-12) 5000 MCG TBDP Take 1 tablet by mouth daily.    Yes [provider]  Docusate Calcium (STOOL SOFTENER PO) Take 1 tablet by mouth daily.   Yes [provider]  IRON, FERROUS SULFATE, PO Take 1 tablet by mouth daily.   Yes [provider]  magnesium oxide (MAG-OX) 400 MG tablet Take 400 mg by mouth daily.   Yes [provider]  Multiple Vitamins-Minerals (MULTIVITAMIN WITH MINERALS) tablet Take 1 tablet by mouth daily.   Yes [provider]  omeprazole (PRILOSEC) 40 MG capsule TAKE ONE CAPSULE BY MOUTH ONE TIME DAILY BEFORE MEAL Patient taking differently: Take 40 mg by mouth daily.  12/01/17  Yes Nafziger, Tommi Rumps, NP  Oxymetazoline HCl (AFRIN NASAL SPRAY NA) Place 1 Squirt into the nose at bedtime.   Yes [provider]  simvastatin (ZOCOR) 20 MG tablet TAKE 1 TABLET AT BEDTIME Patient taking differently: Take 20 mg by mouth at bedtime.  02/07/18  Yes Nafziger, Tommi Rumps, NP  tamsulosin (FLOMAX) 0.4 MG CAPS capsule Take 0.4 mg by mouth daily.  03/04/16  Yes [provider]  valsartan-hydrochlorothiazide (DIOVAN-HCT) 320-25 MG tablet Take 1 tablet by mouth daily. 12/01/17  Yes Nafziger, Tommi Rumps, NP  doxycycline (VIBRAMYCIN) 100 MG capsule Take 1 capsule (100 mg total) by mouth 2 (two) times daily. 08/08/18   Nafziger, Tommi Rumps, NP  fluticasone (FLONASE) 50 MCG/ACT nasal spray Place 2 sprays into both nostrils daily. 08/08/18   Nafziger, Tommi Rumps, NP    metoCLOPramide (REGLAN) 10 MG tablet Take 1 tablet (10 mg total) by mouth at bedtime as needed for nausea. Try half-tablet to start - 04/09/16   Gatha Mayer, MD    Family History Family History  Problem Relation Age of Onset  . Stroke Paternal Grandfather   . Heart disease Paternal Grandfather   . Esophagitis Father        died from perforated esophagus  . Breast cancer Mother   . Colon cancer Maternal Grandmother        questionable    Social History Social History   Tobacco Use  . Smoking status: Former Smoker    Packs/day: 2.00    Years: 20.00    Pack years: 40.00    Types: Cigarettes    Last attempt to quit: 07/05/1974    Years since quitting: 44.1  . Smokeless tobacco: Never Used  Substance Use  Topics  . Alcohol use: Yes    Alcohol/week: 0.0 standard drinks    Comment: socially; also 1-2 glasses of wine or beer every other day   . Drug use: No     Allergies   Contrast media [iodinated diagnostic agents]   Review of Systems Review of Systems  10 Systems reviewed and are negative for acute change except as noted in the HPI.   Physical Exam Updated Vital Signs BP 137/75 (BP Location: Right Arm)   Pulse 60   Temp 98.4 F (36.9 C) (Oral)   Resp 16   Ht 5\' 9"  (1.753 m)   Wt 110.1 kg   SpO2 93%   BMI 35.84 kg/m   Physical Exam Constitutional:      Comments: No respiratory distress  HENT:     Head: Normocephalic and atraumatic.  Cardiovascular:     Rate and Rhythm: Normal rate.     Pulses: Normal pulses.  Pulmonary:     Effort: Pulmonary effort is normal.     Breath sounds: Normal breath sounds.  Abdominal:     General: There is no distension.     Palpations: Abdomen is soft.  Musculoskeletal:        General: No swelling.  Skin:    General: Skin is warm and dry.  Neurological:     General: No focal deficit present.     Mental Status: He is alert.      ED Treatments / Results  Labs (all labs ordered are listed, but only abnormal  results are displayed) Labs Reviewed  BASIC METABOLIC PANEL - Abnormal; Notable for the following components:      Result Value   Glucose, Bld 111 (*)    Creatinine, Ser 1.27 (*)    GFR calc non Af Amer 54 (*)    All other components within normal limits  BASIC METABOLIC PANEL - Abnormal; Notable for the following components:   Glucose, Bld 111 (*)    BUN 24 (*)    GFR calc non Af Amer 59 (*)    All other components within normal limits  CBC - Abnormal; Notable for the following components:   Hemoglobin 12.5 (*)    HCT 38.6 (*)    All other components within normal limits  SURGICAL PCR SCREEN  CBC  I-STAT TROPONIN, ED    EKG EKG Interpretation  Date/Time:  Tuesday August 01 2018 14:50:17 EST Ventricular Rate:  55 PR Interval:  232 QRS Duration: 100 QT Interval:  430 QTC Calculation: 411 R Axis:   -2 Text Interpretation:  Sinus bradycardia with 1st degree A-V block Cannot rule out Inferior infarct , age undetermined Abnormal ECG Confirmed by Sherwood Gambler [phone]) on 08/02/2018 6:44:39 PM   Radiology No results found.  Procedures Procedures (including critical care time)  Medications Ordered in ED Medications  chlorhexidine (HIBICLENS) 4 % liquid 4 application (4 application Topical Given 08/02/18 0950)  ceFAZolin (ANCEF) IVPB 2g/100 mL premix (0 g Intravenous Stopped 08/02/18 1713)  0.9 %  sodium chloride infusion (  Stopped 08/02/18 1810)  ceFAZolin (ANCEF) IVPB 1 g/50 mL premix (1 g Intravenous New Bag/Given 08/03/18 0815)     Initial Impression / Assessment and Plan / ED Course  I have reviewed the triage vital signs and the nursing notes.  Pertinent labs & imaging results that were available during my care of the patient were reviewed by me and considered in my medical decision making (see chart for details).    Patient was  actively being monitored by cardiology and instructed to come to the emergency department for concerning rythm.  Patient being admitted by  EP for definitive management.  Final Clinical Impressions(s) / ED Diagnoses   Final diagnoses:  H/O syncope  AV block    ED Discharge Orders         Ordered    Increase activity slowly     08/03/18 1057    Diet - low sodium heart healthy     08/03/18 1057           Charlesetta Shanks, MD 08/17/18 1742

## 2018-08-18 ENCOUNTER — Ambulatory Visit: Payer: Self-pay | Admitting: *Deleted

## 2018-08-18 NOTE — Telephone Encounter (Signed)
Pt called in c/o having black stools that he noticed this morning.   He has had 2 episodes of passing black stools today.   Did not notice any yesterday.   No GI history.  He vomited last night a liquid fluid.   Denied it being coffee ground in appearance when asked.  He wasn't sure what to do.   "I prefer to wait until Monday to come in and see [facility]".   "I'm not going to the ED unless over the weekend it gets worse or I have symptoms then I will go".  He mentioned his wife is in [place] and they are expecting her to pass away any time now.   "I just want to be there with her".    I have sent these notes to Dorothyann Peng, NP at the [place] office.  I spoke with the flow coordinator regarding the situation.   She is going to let Dorothyann Peng, NP know.      Reason for Disposition . Tarry or jet black-colored stool (not dark green)  Answer Assessment - Initial Assessment Questions 1. APPEARANCE of BLOOD: "What color is it?" "Is it passed separately, on the surface of the stool, or mixed in with the stool?"      I was supposed to have a prostate biopsy soon and had a pacemaker put in recently. Stool black and loosely formed.  2. AMOUNT: "How much blood was passed?"      Not much but it concerns me. 3. FREQUENCY: "How many times has blood been passed with the stools?"      Twice today.   4. ONSET: "When was the blood first seen in the stools?" (Days or weeks)      Today.  Noticed with first BM this morning. 5. DIARRHEA: "Is there also some diarrhea?" If so, ask: "How many diarrhea stools were passed in past 24 hours?"      No   No blood thinners. 6. CONSTIPATION: "Do you have constipation?" If so, "How bad is it?"     I've been straining and I think I have a hemmoroid that is bleeding. 7. RECURRENT SYMPTOMS: "Have you had blood in your stools before?" If so, ask: "When was the last time?" and "What happened that time?"      No 8. BLOOD THINNERS: "Do you take any blood thinners?"  (e.g., Coumadin/warfarin, Pradaxa/dabigatran, aspirin)     No 9. OTHER SYMPTOMS: "Do you have any other symptoms?"  (e.g., abdominal pain, vomiting, dizziness, fever)     No.  A little weak, no dizziness.   No vomiting or fever. 10. PREGNANCY: "Is there any chance you are pregnant?" "When was your last menstrual period?"       N/A  Protocols used: RECTAL BLEEDING-A-AH

## 2018-08-18 NOTE — Telephone Encounter (Signed)
Reviewed with Tommi Rumps and he advises pt stop iron supplements for 2 days to see if symptoms improve. If no improvement after this time would need to be seen for evaluation. If symptoms worse or pt develops abd pain, increased black stools or blood in stool, increased vomiting or other worrisome symptoms pt needs to proceed to UC/ED as soon as possible for evaluation.   Spoke with pt and gave recommendations. Pt voiced understanding to all. Pt will call if needs to be seen, advised anyone can see him on Monday if not improving. Nothing further needed at this time.

## 2018-08-24 ENCOUNTER — Encounter: Payer: Self-pay | Admitting: Pulmonary Disease

## 2018-08-24 ENCOUNTER — Ambulatory Visit (INDEPENDENT_AMBULATORY_CARE_PROVIDER_SITE_OTHER): Payer: Medicare HMO | Admitting: Pulmonary Disease

## 2018-08-24 VITALS — BP 144/78 | HR 60 | Ht 69.0 in | Wt 252.0 lb

## 2018-08-24 DIAGNOSIS — G4733 Obstructive sleep apnea (adult) (pediatric): Secondary | ICD-10-CM | POA: Diagnosis not present

## 2018-08-24 DIAGNOSIS — F4321 Adjustment disorder with depressed mood: Secondary | ICD-10-CM

## 2018-08-24 DIAGNOSIS — R0683 Snoring: Secondary | ICD-10-CM

## 2018-08-24 NOTE — Assessment & Plan Note (Signed)
Given excessive daytime somnolence, narrow pharyngeal exam, witnessed apneas & loud snoring, obstructive sleep apnea is very likely & an overnight polysomnogram will be scheduled as a home study. The pathophysiology of obstructive sleep apnea , it's cardiovascular consequences & modes of treatment including CPAP were discused with the patient in detail & they evidenced understanding.  Pretest probability is high and he would be agreeable to using a CPAP machine

## 2018-08-24 NOTE — Patient Instructions (Signed)
Home sleep test 
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
## 2018-08-24 NOTE — Assessment & Plan Note (Signed)
Clearly several confounding factors to account for non-refreshing sleep besides OSA-he clearly has uncontrolled GERD in spite of medications, his wife illness is definitely stressing him out

## 2018-08-28 ENCOUNTER — Telehealth: Payer: Self-pay | Admitting: Cardiology

## 2018-08-28 ENCOUNTER — Telehealth: Payer: Self-pay | Admitting: Internal Medicine

## 2018-08-28 NOTE — Telephone Encounter (Signed)
Patient called and stated that his device site is hard, swollen, itchy, and has bumps around it. He wants to know if this is normal. Device Tech RN talked to the patient. Offered him a 9:00 AM appt w/ [facility].

## 2018-08-28 NOTE — Telephone Encounter (Signed)
Pt reported that he is having GERD symptoms and that "stuff is coming up during sleep causing him to choke."  Please advise scheduling.

## 2018-08-28 NOTE — Telephone Encounter (Signed)
Patient reports GERD and nocturnal regurgitation.  He will come in and see Dr. Carlean Purl on 08/31/18 10:30

## 2018-08-29 ENCOUNTER — Ambulatory Visit (INDEPENDENT_AMBULATORY_CARE_PROVIDER_SITE_OTHER): Payer: Medicare HMO

## 2018-08-29 DIAGNOSIS — I4430 Unspecified atrioventricular block: Secondary | ICD-10-CM

## 2018-08-29 NOTE — Progress Notes (Signed)
Incision site assessed, the bumps that pt is feeling, is slight puckering of skin at corners of incision. No redness, drainage noted, pt could feel the outline of the device and the device itself which is a normal finding. Explained all this to pt and educated to call back if pt has any more concerns or questions, pt voiced understanding.

## 2018-08-31 ENCOUNTER — Ambulatory Visit: Payer: Medicare HMO | Admitting: Internal Medicine

## 2018-08-31 ENCOUNTER — Encounter: Payer: Self-pay | Admitting: Internal Medicine

## 2018-08-31 VITALS — BP 122/68 | HR 64 | Ht 69.0 in | Wt 254.2 lb

## 2018-08-31 DIAGNOSIS — Z9884 Bariatric surgery status: Secondary | ICD-10-CM | POA: Diagnosis not present

## 2018-08-31 DIAGNOSIS — K30 Functional dyspepsia: Secondary | ICD-10-CM

## 2018-08-31 DIAGNOSIS — K219 Gastro-esophageal reflux disease without esophagitis: Secondary | ICD-10-CM

## 2018-08-31 MED ORDER — METOCLOPRAMIDE HCL 10 MG PO TABS: ORAL_TABLET | ORAL | 0 refills | Status: DC

## 2018-08-31 NOTE — Progress Notes (Signed)
Marcus [name] 78 y.o. October 24, 1940 [id number]  Assessment & Plan:   Encounter Diagnoses  Name Primary?  . Gastroesophageal reflux disease, esophagitis presence not specified Yes  . H/O bariatric surgery   . Delayed gastric emptying      Prn metaclopramide # 90 no RF Lifestyle changes - timing of meals,HOB up, low fiber diet THESE ARE MOST IMPORTANT Side effects of metaclopramide  fully discussed - including tardive dyskinesia, neuropathy and increased Parkinson's   Cc;Nafziger, Cory, NP   Subjective:   Chief Complaint:  HPI Patient having regurgitation and reflux if eats after 7 PM. If he does that he will sleep in recliner for a while. Same issues as in past - last eval 2018 w/ UGI showing post gastric-stapling with gastric stenosis - EGD w/ food retention, I dilated the stenosis in gastric remnant and removed a hyperplastic polyp. He was educated to follow lifestyle changes, not eat after 7 P and use 10 mg metaclopramide qhs prn.   Does not remember the metaclopramide. Wanted to know if any newer meds or procedures.   Allergies  Allergen Reactions  . Contrast Media [Iodinated Diagnostic Agents] Palpitations    TACHYCARDIA- patient states he has tolerated newer agents since this reaction >30 yrs ago   Current Meds  Medication Sig  . allopurinol (ZYLOPRIM) 300 MG tablet Take 1 tablet (300 mg total) by mouth daily.  [name] 10 MG tablet Take 1 tablet (10 mg total) by mouth daily.  . Cholecalciferol (VITAMIN D) 2000 UNITS CAPS Take 1 capsule by mouth daily.  . Cyanocobalamin (VITAMIN B-12) 5000 MCG TBDP Take 1 tablet by mouth daily.   [name] Calcium (STOOL SOFTENER PO) Take 1 tablet by mouth daily.  . fluticasone (FLONASE) 50 MCG/ACT nasal spray Place 2 sprays into both nostrils daily.  . magnesium oxide (MAG-OX) 400 MG tablet Take 400 mg by mouth daily.  . Multiple Vitamins-Minerals (MULTIVITAMIN WITH MINERALS) tablet Take 1 tablet by mouth daily.    [name] Kitchen omeprazole (PRILOSEC) 40 MG capsule TAKE ONE CAPSULE BY MOUTH ONE TIME DAILY BEFORE MEAL (Patient taking differently: Take 40 mg by mouth daily. )  . Oxymetazoline HCl (AFRIN NASAL SPRAY NA) Place 1 Squirt into the nose at bedtime.  . Potassium 99 MG TABS Take by mouth daily.  . simvastatin (ZOCOR) 20 MG tablet TAKE 1 TABLET AT BEDTIME (Patient taking differently: Take 20 mg by mouth at bedtime. )  . tamsulosin (FLOMAX) 0.4 MG CAPS capsule Take 0.4 mg by mouth daily.   . valsartan-hydrochlorothiazide (DIOVAN-HCT) 320-25 MG tablet Take 1 tablet by mouth daily.   Past Medical History:  Diagnosis Date  . Acute meniscal tear of knee left  . Arthritis    generalized  . Calcaneal spur   . Colon cancer ([place]) 1983  . Coronary artery disease    dr Stanford Breed  . Diverticulosis of colon (without mention of hemorrhage)   . Echocardiogram findings abnormal, without diagnosis 04-23-2010   normal LV function, mod. left atrial enlargement, mild right artrial enlargement and grade 1 diastolic dysfunction  . Edema of lower extremity    ankles, elevates legs  . Elevated prostate specific antigen (PSA)   . Esophageal reflux   . External hemorrhoids   . Frequency of urination    followed by dr dalhstedt  . Gout   . Gout, unspecified   . Hiatal hernia   . History of colon cancer 1988  . Hyperlipidemia   . Lumbar spondylolysis   .  Normal nuclear stress test 04-23-2010   ef 51%,  septal hypokinesis, normal perfusion  . OSA (obstructive sleep apnea)    does not use CPAP   . Paraesophageal hernia    large  . Personal history of colonic polyps 08/21/2009   TUBULAR ADENOMAS  . Pneumonia    history of; last episode 2015  . Postgastric surgery syndromes   . Prostate cancer ([place]) 2016  . Unspecified essential hypertension   . Unspecified vitamin D deficiency   . Ventral hernia   . Vitamin B12 deficiency    Past Surgical History:  Procedure Laterality Date  . CARDIAC CATHETERIZATION    .  CATARACT EXTRACTION W/ INTRAOCULAR LENS  IMPLANT, BILATERAL Bilateral   . COLONOSCOPY    . ESOPHAGOGASTRODUODENOSCOPY    . KNEE ARTHROSCOPY Right 2007  . KNEE ARTHROSCOPY  07/13/2011   Procedure: ARTHROSCOPY KNEE;  Surgeon: Cynda Familia;  Location: [facility];  Service: Orthopedics;  Laterality: Left;  partial menisectomy with chondrylplasty  . KNEE SURGERY Left   . LAMINECTOMY AND MICRODISCECTOMY LUMBAR SPINE  MARCH  2008   L3 -  4  . LAPAROSCOPIC INCISIONAL / UMBILICAL / VENTRAL HERNIA REPAIR  2006  . PACEMAKER IMPLANT N/A 08/02/2018   Procedure: PACEMAKER IMPLANT;  Surgeon: Evans Lance, MD;  Location: [facility];  Service: Cardiovascular;  Laterality: N/A;  . PROSTATE BIOPSY    . SHOULDER ARTHROSCOPY Right 05-23-2007  . SIGMOID COLECTOMY FOR CANCER  1989  . TOTAL KNEE ARTHROPLASTY Left 05/23/2015   Procedure: LEFT TOTAL KNEE ARTHROPLASTY;  Surgeon: Sydnee Cabal, MD;  Location: [facility];  Service: Orthopedics;  Laterality: Left;  [name] Kitchen VERTICAL BANDED GASTROPLASTY  1986   Social History   Social History Narrative   Married   Former Smoker    Alcohol use-yes 1-2 drinks per day      Occupation: Retired Midwife      Originally from [place], [place] - in [place] > 10 yrs as of 2017      family history includes Breast cancer in his mother; Colon cancer in his maternal grandmother; Esophagitis in his father; Heart disease in his paternal grandfather; Stroke in his paternal grandfather.   Review of Systems As above New dx prostate cancer Getting sleep study reassessment for OSA which he has not been using CPAP mask New pacemaker Objective:   Physical Exam BP 122/68   Pulse 64   Ht 5\' 9"  (1.753 m)   Wt 254 lb 4 oz (115.3 kg)   BMI 37.55 kg/m  NAD  15 minutes time spent with patient > half in counseling coordination of care

## 2018-08-31 NOTE — Patient Instructions (Signed)
Use Reglan as needed if you eat late at night.  Try to not eat after 7:00pm.   We are giving you a low fiber diet to read and follow.   Raise the head of your bed.    I appreciate the opportunity to care for you. Silvano Rusk, MD, [facility]

## 2018-09-06 DIAGNOSIS — G4733 Obstructive sleep apnea (adult) (pediatric): Secondary | ICD-10-CM | POA: Diagnosis not present

## 2018-09-06 DIAGNOSIS — R0683 Snoring: Secondary | ICD-10-CM

## 2018-09-08 ENCOUNTER — Other Ambulatory Visit: Payer: Self-pay | Admitting: Adult Health

## 2018-09-08 DIAGNOSIS — I10 Essential (primary) hypertension: Secondary | ICD-10-CM

## 2018-09-08 DIAGNOSIS — G4733 Obstructive sleep apnea (adult) (pediatric): Secondary | ICD-10-CM | POA: Diagnosis not present

## 2018-09-11 ENCOUNTER — Telehealth: Payer: Self-pay | Admitting: Pulmonary Disease

## 2018-09-11 DIAGNOSIS — C61 Malignant neoplasm of prostate: Secondary | ICD-10-CM | POA: Diagnosis not present

## 2018-09-11 DIAGNOSIS — G4733 Obstructive sleep apnea (adult) (pediatric): Secondary | ICD-10-CM

## 2018-09-11 NOTE — Telephone Encounter (Signed)
Per RA, HST showed severe OSA with 53 events per hour. Recommends a cpap titration study as the next step.

## 2018-09-12 NOTE — Telephone Encounter (Signed)
Sent to the pharmacy by e-scribe. 

## 2018-09-12 NOTE — Telephone Encounter (Signed)
Cory, filled for 90 days only 11/2017.  Seems pt is not taking as prescribed.

## 2018-09-12 NOTE — Telephone Encounter (Signed)
Ok to refill but lets make sure he knows he needs to take them daily

## 2018-09-14 NOTE — Telephone Encounter (Signed)
Called and spoke with pt letting him know the results of the HST and stated to him that RA wanted him to have cpap titration study. Pt expressed understanding and was fine with that. Order has been placed for cpap titration. Nothing further needed.

## 2018-09-15 ENCOUNTER — Encounter: Payer: Self-pay | Admitting: *Deleted

## 2018-09-19 ENCOUNTER — Ambulatory Visit (INDEPENDENT_AMBULATORY_CARE_PROVIDER_SITE_OTHER): Payer: Medicare HMO | Admitting: Adult Health

## 2018-09-19 ENCOUNTER — Telehealth: Payer: Self-pay | Admitting: Radiation Oncology

## 2018-09-19 ENCOUNTER — Other Ambulatory Visit: Payer: Self-pay

## 2018-09-19 ENCOUNTER — Ambulatory Visit (HOSPITAL_BASED_OUTPATIENT_CLINIC_OR_DEPARTMENT_OTHER): Payer: Medicare HMO | Attending: Pulmonary Disease | Admitting: Pulmonary Disease

## 2018-09-19 ENCOUNTER — Encounter: Payer: Self-pay | Admitting: Adult Health

## 2018-09-19 VITALS — BP 120/68 | Temp 98.1°F | Wt 252.0 lb

## 2018-09-19 VITALS — Ht 68.0 in | Wt 240.0 lb

## 2018-09-19 DIAGNOSIS — J069 Acute upper respiratory infection, unspecified: Secondary | ICD-10-CM

## 2018-09-19 DIAGNOSIS — G4733 Obstructive sleep apnea (adult) (pediatric): Secondary | ICD-10-CM | POA: Insufficient documentation

## 2018-09-19 NOTE — Progress Notes (Signed)
Subjective:    Patient ID: Marcus [name], male    DOB: 1940-11-13, 78 y.o.   MRN: [id number]  URI   This is a new problem. The current episode started in the past 7 days (3 days). The problem has been gradually improving. There has been no fever. Associated symptoms include coughing (productive). Pertinent negatives include no congestion, diarrhea, headaches, nausea, plugged ear sensation, rhinorrhea, sinus pain, sore throat, swollen glands or wheezing. He has tried nothing for the symptoms.      Review of Systems  Constitutional: Positive for chills (resolved).  HENT: Negative for congestion, rhinorrhea, sinus pressure, sinus pain, sore throat and trouble swallowing.   Respiratory: Positive for cough (productive). Negative for wheezing.   Cardiovascular: Negative.   Gastrointestinal: Negative.  Negative for diarrhea and nausea.  Genitourinary: Negative.   Skin: Negative.   Neurological: Negative for headaches.  Hematological: Negative.    Past Medical History:  Diagnosis Date  . Acute meniscal tear of knee left  . Arthritis    generalized  . Calcaneal spur   . Colon cancer ([place]) 1983  . Coronary artery disease    dr Stanford Breed  . Diverticulosis of colon (without mention of hemorrhage)   . Echocardiogram findings abnormal, without diagnosis 04-23-2010   normal LV function, mod. left atrial enlargement, mild right artrial enlargement and grade 1 diastolic dysfunction  . Edema of lower extremity    ankles, elevates legs  . Elevated prostate specific antigen (PSA)   . Esophageal reflux   . External hemorrhoids   . Frequency of urination    followed by dr dalhstedt  . Gout   . Gout, unspecified   . Hiatal hernia   . History of colon cancer 1988  . Hyperlipidemia   . Lumbar spondylolysis   . Normal nuclear stress test 04-23-2010   ef 51%,  septal hypokinesis, normal perfusion  . OSA (obstructive sleep apnea)    does not use CPAP   . Paraesophageal hernia    large  .  Personal history of colonic polyps 08/21/2009   TUBULAR ADENOMAS  . Pneumonia    history of; last episode 2015  . Postgastric surgery syndromes   . Prostate cancer ([place]) 2016  . Unspecified essential hypertension   . Unspecified vitamin D deficiency   . Ventral hernia   . Vitamin B12 deficiency     Social History   Socioeconomic History  . Marital status: Married    Spouse name: Not on file  . Number of children: 4  . Years of education: Not on file  . Highest education level: Not on file  Occupational History  . Occupation: retired    Fish farm manager: [employer]  . Financial resource strain: Not on file  . Food insecurity:    Worry: Not on file    Inability: Not on file  . Transportation needs:    Medical: Not on file    Non-medical: Not on file  Tobacco Use  . Smoking status: Former Smoker    Packs/day: 2.00    Years: 20.00    Pack years: 40.00    Types: Cigarettes    Last attempt to quit: 07/05/1974    Years since quitting: 44.2  . Smokeless tobacco: Never Used  Substance and Sexual Activity  . Alcohol use: Yes    Alcohol/week: 0.0 standard drinks    Comment: socially; also 1-2 glasses of wine or beer every other day   . Drug use: No  .  Sexual activity: Not on file  Lifestyle  . Physical activity:    Days per week: Not on file    Minutes per session: Not on file  . Stress: Not on file  Relationships  . Social connections:    Talks on phone: Not on file    Gets together: Not on file    Attends religious service: Not on file    Active member of club or organization: Not on file    Attends meetings of clubs or organizations: Not on file    Relationship status: Not on file  . Intimate partner violence:    Fear of current or ex partner: Not on file    Emotionally abused: Not on file    Physically abused: Not on file    Forced sexual activity: Not on file  Other Topics Concern  . Not on file  Social History Narrative   Married   Former Smoker     Alcohol use-yes 1-2 drinks per day      Occupation: Retired Midwife      Originally from [place], [place] - in [place] > 10 yrs as of 2017       Past Surgical History:  Procedure Laterality Date  . CARDIAC CATHETERIZATION    . CATARACT EXTRACTION W/ INTRAOCULAR LENS  IMPLANT, BILATERAL Bilateral   . COLONOSCOPY    . ESOPHAGOGASTRODUODENOSCOPY    . KNEE ARTHROSCOPY Right 2007  . KNEE ARTHROSCOPY  07/13/2011   Procedure: ARTHROSCOPY KNEE;  Surgeon: Cynda Familia;  Location: [facility];  Service: Orthopedics;  Laterality: Left;  partial menisectomy with chondrylplasty  . KNEE SURGERY Left   . LAMINECTOMY AND MICRODISCECTOMY LUMBAR SPINE  MARCH  2008   L3 -  4  . LAPAROSCOPIC INCISIONAL / UMBILICAL / VENTRAL HERNIA REPAIR  2006  . PACEMAKER IMPLANT N/A 08/02/2018   Procedure: PACEMAKER IMPLANT;  Surgeon: Evans Lance, MD;  Location: [facility];  Service: Cardiovascular;  Laterality: N/A;  . PROSTATE BIOPSY    . SHOULDER ARTHROSCOPY Right 05-23-2007  . SIGMOID COLECTOMY FOR CANCER  1989  . TOTAL KNEE ARTHROPLASTY Left 05/23/2015   Procedure: LEFT TOTAL KNEE ARTHROPLASTY;  Surgeon: Sydnee Cabal, MD;  [facility];  Service: Orthopedics;  Laterality: Left;  [name] Kitchen VERTICAL BANDED GASTROPLASTY  1986    Family History  Problem Relation Age of Onset  . Stroke Paternal Grandfather   . Heart disease Paternal Grandfather   . Esophagitis Father        died from perforated esophagus  . Breast cancer Mother   . Colon cancer Maternal Grandmother        questionable    Allergies  Allergen Reactions  . Contrast Media [Iodinated Diagnostic Agents] Palpitations    TACHYCARDIA- patient states he has tolerated newer agents since this reaction >30 yrs ago    Current Outpatient Medications on File Prior to Visit  Medication Sig Dispense Refill  . allopurinol (ZYLOPRIM) 300 MG tablet TAKE 1 TABLET EVERY DAY 90 tablet 0  . amLODipine (NORVASC) 10 MG tablet TAKE 1  TABLET EVERY DAY 90 tablet 0  . Cholecalciferol (VITAMIN D) 2000 UNITS CAPS Take 1 capsule by mouth daily.    . Cyanocobalamin (VITAMIN B-12) 5000 MCG TBDP Take 1 tablet by mouth daily.     [name] Calcium (STOOL SOFTENER PO) Take 1 tablet by mouth daily.    . fluticasone (FLONASE) 50 MCG/ACT nasal spray Place 2 sprays into both nostrils daily. [place]  g 6  . magnesium oxide (MAG-OX) 400 MG tablet Take 400 mg by mouth daily.    . metoCLOPramide (REGLAN) 10 MG tablet qhs prn 90 tablet 0  . Multiple Vitamins-Minerals (MULTIVITAMIN WITH MINERALS) tablet Take 1 tablet by mouth daily.    [name] Kitchen omeprazole (PRILOSEC) 40 MG capsule TAKE ONE CAPSULE BY MOUTH ONE TIME DAILY BEFORE MEAL (Patient taking differently: Take 40 mg by mouth daily. ) 90 capsule 0  . Oxymetazoline HCl (AFRIN NASAL SPRAY NA) Place 1 Squirt into the nose at bedtime.    . Potassium 99 MG TABS Take by mouth daily.    . simvastatin (ZOCOR) 20 MG tablet TAKE 1 TABLET AT BEDTIME (Patient taking differently: Take 20 mg by mouth at bedtime. ) 90 tablet 1  . tamsulosin (FLOMAX) 0.4 MG CAPS capsule Take 0.4 mg by mouth daily.     . valsartan-hydrochlorothiazide (DIOVAN-HCT) 320-25 MG tablet TAKE 1 TABLET EVERY DAY 90 tablet 0   No current facility-administered medications on file prior to visit.     BP 120/68   Temp 98.1 F (36.7 C)   Wt 252 lb (114.3 kg)   BMI 37.21 kg/m        Objective:   Physical Exam Vitals signs and nursing note reviewed.  Constitutional:      Appearance: Normal appearance.  HENT:     Right Ear: Tympanic membrane, ear canal and external ear normal.     Left Ear: Tympanic membrane, ear canal and external ear normal.     Nose: Nose normal. No congestion or rhinorrhea.     Mouth/Throat:     Mouth: Mucous membranes are moist.  Cardiovascular:     Rate and Rhythm: Normal rate and regular rhythm.     Pulses: Normal pulses.     Heart sounds: Normal heart sounds.  Pulmonary:     Effort: Pulmonary effort is  normal.     Breath sounds: Normal breath sounds.  Musculoskeletal: Normal range of motion.  Skin:    General: Skin is warm and dry.  Neurological:     General: No focal deficit present.     Mental Status: He is alert and oriented to person, place, and time.  Psychiatric:        Mood and Affect: Mood normal.        Behavior: Behavior normal.        Thought Content: Thought content normal.        Judgment: Judgment normal.       Assessment & Plan:  1. Upper respiratory tract infection, unspecified type - No signs of bacterial infection  - Advised mucinex, fluids, and rest - Return precautions reviewed  Dorothyann Peng, NP

## 2018-09-19 NOTE — Telephone Encounter (Signed)
Received voicemail message from patient requesting return call. Phoned patient back. Patient states, "why haven't yall called me with an appointment.[name] Kitcheni am sitting here waiting to die." Explained no referral had been received. Offered to call Dr. Diona Fanti office tomorrow morning and inquire. Patient confirmed he would appreciate that. Committed to calling patient tomorrow with an update.

## 2018-09-20 NOTE — Telephone Encounter (Signed)
Phoned [facility] @ [facility] to inquire about referral as promised. Bethena Roys explained that the patient has already called her office this morning. Bethena Roys goes onto explain that she spoke with Verdie Drown in my office who confirmed the referral paperwork from received on Thursday, March 12th. Also, Bethena Roys goes onto explain that Juliann Pulse committed to ensuring the patient gets scheduled by the end of the business day and phoned. In addition, Bethena Roys verbalized that she already phoned the patient with an update. This RN is signing off.

## 2018-09-22 ENCOUNTER — Telehealth: Payer: Self-pay | Admitting: Pulmonary Disease

## 2018-09-22 DIAGNOSIS — G4733 Obstructive sleep apnea (adult) (pediatric): Secondary | ICD-10-CM

## 2018-09-22 NOTE — Telephone Encounter (Signed)
I will review this next week and get back to him

## 2018-09-22 NOTE — Telephone Encounter (Signed)
Called and spoke with patient he is aware and verbalized understanding. 

## 2018-09-22 NOTE — Telephone Encounter (Signed)
Called and spoke with patient, he stated that he was waiting on results from a study. I am assuming it is the CPAP titration that is in process in his chart. He is requesting results for this.   RA please advise, thank you.

## 2018-09-26 DIAGNOSIS — G4730 Sleep apnea, unspecified: Secondary | ICD-10-CM | POA: Diagnosis not present

## 2018-09-26 NOTE — Procedures (Signed)
Patient Name: Marcus [name], Marcus [name] Date: 09/19/2018 Gender: Male D.O.B: 10/18/1940 Age (years): 16 Referring Provider: Kara Mead MD, ABSM Height (inches): 68 Interpreting Physician: Kara Mead MD, ABSM Weight (lbs): 240 RPSGT: Zadie Rhine BMI: 36 MRN: [id number] Neck Size: 18.50 <br> <br> CLINICAL INFORMATION The patient is referred for a CPAP titration to treat sleep apnea.  Date of  HST: 08/2018 , showed severe OSA with AHI 53/h  SLEEP STUDY TECHNIQUE As per the AASM Manual for the Scoring of Sleep and Associated Events v2.3 (April 2016) with a hypopnea requiring 4% desaturations.  The channels recorded and monitored were frontal, central and occipital EEG, electrooculogram (EOG), submentalis EMG (chin), nasal and oral airflow, thoracic and abdominal wall motion, anterior tibialis EMG, snore microphone, electrocardiogram, and pulse oximetry. Continuous positive airway pressure (CPAP) was initiated at the beginning of the study and titrated to treat sleep-disordered breathing.  MEDICATIONS Medications self-administered by patient taken the night of the study : N/A  RESPIRATORY PARAMETERS Optimal PAP Pressure (cm): 8 AHI at Optimal Pressure (/hr): 0.0 Overall Minimal O2 (%): 86.0 Supine % at Optimal Pressure (%): 0 Minimal O2 at Optimal Pressure (%): 87.0   SLEEP ARCHITECTURE The study was initiated at 9:37:37 PM and ended at 11:58:15 PM.  Sleep onset time was 8.1 minutes and the sleep efficiency was 77.9%%. The total sleep time was 109.5 minutes.  The patient spent 5.9%% of the night in stage N1 sleep, 54.3%% in stage N2 sleep, 0.0%% in stage N3 and 39.7% in REM.Stage REM latency was 27.0 minutes  Wake after sleep onset was 23.0. Alpha intrusion was absent. Supine sleep was 0.00%.  CARDIAC DATA The 2 lead EKG demonstrated sinus rhythm, pacemaker generated. The mean heart rate was 60.9 beats per minute. Other EKG findings include: None. LEG MOVEMENT DATA The total  Periodic Limb Movements of Sleep (PLMS) were 0. The PLMS index was 0.0. A PLMS index of <15 is considered normal in adults.  IMPRESSIONS - The optimal PAP pressure was 8 cm of water. - Central sleep apnea was not noted during this titration (CAI = 0.0/h). - Moderate oxygen desaturations were observed during this titration (min O2 = 86.0%). - No snoring was audible during this study. - No cardiac abnormalities were observed during this study. - Clinically significant periodic limb movements were not noted during this study. Arousals associated with PLMs were rare.   DIAGNOSIS - Obstructive Sleep Apnea (327.23 [G47.33 ICD-10])   RECOMMENDATIONS - Trial of CPAP therapy on 8 cm H2O with a Medium size Fisher&Paykel Full Face Mask Simplus mask and heated humidification. - Avoid alcohol, sedatives and other CNS depressants that may worsen sleep apnea and disrupt normal sleep architecture. - Sleep hygiene should be reviewed to assess factors that may improve sleep quality. - Weight management and regular exercise should be initiated or continued. - Return to [facility] Center for re-evaluation after 4 weeks of therapy   Kara Mead MD Board Certified in [place]

## 2018-09-26 NOTE — Telephone Encounter (Signed)
Called and spoke with patient regarding results.  Informed the patient of results and recommendations today. Order  CPAP therapy on 8 cm H2O with a Medium size Fisher&Paykel Full Face Mask Simplus mask and heated humidification. Pt verbalized understanding and denied any questions or concerns at this time.  Patient advised he will call in June 2020 to schedule ov with RA at that time for cpap f/u Nothing further needed.

## 2018-09-26 NOTE — Telephone Encounter (Signed)
Patient call was given to me to speak with the patient about a HST.  However, pt was returning a call to get results.

## 2018-09-26 NOTE — Telephone Encounter (Signed)
LMTCB with results of sleep study.

## 2018-09-26 NOTE — Telephone Encounter (Signed)
Pt returning call regarding Home sleep study

## 2018-09-26 NOTE — Telephone Encounter (Signed)
Pl send order to DME for & let pt know -   CPAP therapy on 8 cm H2O with a Medium size Fisher&Paykel Full Face Mask Simplus mask and heated humidification.   OV in 6-8 wks with NP/ me

## 2018-09-28 ENCOUNTER — Telehealth: Payer: Self-pay | Admitting: Pulmonary Disease

## 2018-09-28 NOTE — Telephone Encounter (Signed)
Message forwarded to [facility].

## 2018-10-02 NOTE — Progress Notes (Signed)
GU Location of Tumor / Histology: prostatic adenocarcinoma  If Prostate Cancer, Gleason Score is (3 + 4) and PSA is (8.08) on 03/15/2018. Prostate volume: 80.46 grams.  Marcus [name] has been on active surveillance since 06/2014. On 07/04/2014 his PSA was 6.03, volume 59, gleason 3+3, 3 /12 cores positive for disease, and oncotype dx 16. Repeat biopsy performed 04/04/2015 and only change was that 4/12 core were involved. Prostate MRI done 07/13/2018 revealing a prostate volume of 49. Fusion biopsy done 09/11/2018.  Biopsies of prostate (if applicable) revealed:    Past/Anticipated interventions by urology, if any: prostate biopsy, active surveillance, repeat biopsies x 2, Flomax, referral for consideration of radiotherapy  Past/Anticipated interventions by medical oncology, if any: no  Weight changes, if any: no  Bowel/Bladder complaints, if any: IPSS 6. SHIM 1. Denies dysuria. Reports hematuria is improving slowly following biopsy. Denies urinary leakage or incontinence.   Nausea/Vomiting, if any: no  Pain issues, if any:  no  SAFETY ISSUES:  Prior radiation? no  Pacemaker/ICD? Yes, [place]  Possible current pregnancy? no, male patient  Is the patient on methotrexate? no  Current Complaints / other details:  78 year old male. Ax: contrast dye. Retired. Stopped smoking 02/03/1976. Married with 2 daughters and 2 sons.  Colon cancer 2008. Reports the colon ca was surgically removed and he did not received radiation or chemotherapy.

## 2018-10-03 ENCOUNTER — Encounter: Payer: Self-pay | Admitting: Radiation Oncology

## 2018-10-03 ENCOUNTER — Ambulatory Visit
Admission: RE | Admit: 2018-10-03 | Discharge: 2018-10-03 | Disposition: A | Discharge status: Home / Self Care | Payer: Medicare HMO | Source: Ambulatory Visit | Attending: Radiation Oncology | Admitting: Radiation Oncology

## 2018-10-03 ENCOUNTER — Other Ambulatory Visit: Payer: Self-pay

## 2018-10-03 VITALS — Ht 68.0 in | Wt 250.0 lb

## 2018-10-03 DIAGNOSIS — C61 Malignant neoplasm of prostate: Secondary | ICD-10-CM | POA: Diagnosis not present

## 2018-10-03 DIAGNOSIS — Z808 Family history of malignant neoplasm of other organs or systems: Secondary | ICD-10-CM | POA: Diagnosis not present

## 2018-10-03 DIAGNOSIS — R972 Elevated prostate specific antigen [PSA]: Secondary | ICD-10-CM | POA: Diagnosis not present

## 2018-10-03 DIAGNOSIS — Z85038 Personal history of other malignant neoplasm of large intestine: Secondary | ICD-10-CM | POA: Diagnosis not present

## 2018-10-03 DIAGNOSIS — Z0289 Encounter for other administrative examinations: Secondary | ICD-10-CM | POA: Insufficient documentation

## 2018-10-03 NOTE — Progress Notes (Signed)
Radiation Oncology         [phone] ________________________________  Initial Outpatient Consultation - Conducted via WedEx due to current COVID-19 concerns for limiting patient exposure  Name: Marcus [name] MRN: [id number]  Date: 10/03/2018  DOB: 10-Jun-1941  FB:PZWCHENI, Tommi Rumps, NP  Franchot Gallo, MD   REFERRING PHYSICIAN: Franchot Gallo, MD  DIAGNOSIS: 78 y.o. gentleman with Stage T1c adenocarcinoma of the prostate with Gleason score of 3+4, and PSA of 9.03.    ICD-10-CM   1. Malignant neoplasm of prostate ([place]) C61     HISTORY OF PRESENT ILLNESS: Marcus [name] is a 78 y.o. male with a diagnosis of prostate cancer. He has been followed by Dr. Diona Fanti for elevated PSA. He was initially diagnosed with stage T1c, Gleason 3+3 prostate cancer on 07/04/2014 with a PSA of 6.03 at that time.  An Oncotype DX test was performed which was favorable, therefore, the patient elected to proceed with active surveillance.  A repeat surveillance prostate biopsy on 04/04/2015 confirmed disease stability, with Gleason 3+3 and PSA of 6.32. He has remained under close observation since that time.   His PSA rose to 8.06 on 03/08/2018, and the recommendation at that time was to proceed with prostate MRI followed by a MR fusion prostate biopsy for further evaluation.  These exams were postponed due to the need for patient to place his wife in a skilled nursing facility for advanced Alzheimer's care at the time.  He proceeded with prostate MRI on 07/13/2018, which demonstrated 3 suspicious, PI-RADS 3  lesions within the prostate but no evidence for extracapsular extension, SVI, NVI, bony or lymph node involvement. The patient proceeded to MRI fusion biopsy with 12 routine systematic biopsies and 9 additional biopsies targeting the suspicious MRI lesions (ROI- region of interest) on 09/11/2018.  A repeat PSA at that time was noted to be further elevated at 9.03. The prostate volume measured 80.46 cc.  Out of  12 systematic core biopsies, 5 were positive, all on the left side. An additional 3 cores from each ROI lesion were biopsied; out of those 9 cores, all 3 from lesion #2 were positive. The maximum Gleason score was 3+4, and this was seen in ROI lesion #2 (3 cores), left base lateral, left mid lateral, left apex lateral, and left mid.  Additionally, Gleason 3+3 disease was seen in left apex.  The patient reviewed the biopsy results with his urologist and he has kindly been referred today for discussion of potential radiation treatment options.  Of note, he reports a history of colon cancer in 2008 treated with partial colectomy. He reports that the cancer was successfully surgically removed, and he did not require further treatment.  He has not had any recurrences since that time and remains up-to-date with routine colonoscopy.   PREVIOUS RADIATION THERAPY: No  PAST MEDICAL HISTORY:  Past Medical History:  Diagnosis Date   Acute meniscal tear of knee left   Arthritis    generalized   Calcaneal spur    Colon cancer ([place]) 1983   surgical removed. did not receive radiation or chemotherapy.   Coronary artery disease    dr Stanford Breed   Diverticulosis of colon (without mention of hemorrhage)    Echocardiogram findings abnormal, without diagnosis 04-23-2010   normal LV function, mod. left atrial enlargement, mild right artrial enlargement and grade 1 diastolic dysfunction   Edema of lower extremity    ankles, elevates legs   Elevated prostate specific antigen (PSA)    Esophageal reflux  External hemorrhoids    Frequency of urination    followed by dr dalhstedt   Gout    Gout, unspecified    Hiatal hernia    History of colon cancer 1988   Hyperlipidemia    Lumbar spondylolysis    Normal nuclear stress test 04-23-2010   ef 51%,  septal hypokinesis, normal perfusion   OSA (obstructive sleep apnea)    does not use CPAP    Paraesophageal hernia    large   Personal  history of colonic polyps 08/21/2009   TUBULAR ADENOMAS   Pneumonia    history of; last episode 2015   Postgastric surgery syndromes    Prostate cancer ([place]) 2016   Unspecified essential hypertension    Unspecified vitamin D deficiency    Ventral hernia    Vitamin B12 deficiency       PAST SURGICAL HISTORY: Past Surgical History:  Procedure Laterality Date   CARDIAC CATHETERIZATION     CATARACT EXTRACTION W/ INTRAOCULAR LENS  IMPLANT, BILATERAL Bilateral    COLONOSCOPY     ESOPHAGOGASTRODUODENOSCOPY     KNEE ARTHROSCOPY Right 2007   KNEE ARTHROSCOPY  07/13/2011   Procedure: ARTHROSCOPY KNEE;  Surgeon: Cynda Familia;  Location: [facility];  Service: Orthopedics;  Laterality: Left;  partial menisectomy with chondrylplasty   KNEE SURGERY Left    LAMINECTOMY AND MICRODISCECTOMY LUMBAR SPINE  MARCH  2008   L3 -  4   LAPAROSCOPIC INCISIONAL / UMBILICAL / VENTRAL HERNIA REPAIR  2006   PACEMAKER IMPLANT N/A 08/02/2018   Procedure: PACEMAKER IMPLANT;  Surgeon: Evans Lance, MD;  Location: [facility];  Service: Cardiovascular;  Laterality: N/A;   PROSTATE BIOPSY     SHOULDER ARTHROSCOPY Right 05-23-2007   SIGMOID COLECTOMY FOR CANCER  1989   TOTAL KNEE ARTHROPLASTY Left 05/23/2015   Procedure: LEFT TOTAL KNEE ARTHROPLASTY;  Surgeon: Sydnee Cabal, MD;  Location: [facility];  Service: Orthopedics;  Laterality: Left;   VERTICAL BANDED GASTROPLASTY  1986    FAMILY HISTORY:  Family History  Problem Relation Age of Onset   Stroke Paternal Grandfather    Heart disease Paternal Grandfather    Esophagitis Father        died from perforated esophagus   Breast cancer Mother    Colon cancer Maternal Grandmother        questionable    SOCIAL HISTORY:  Social History   Socioeconomic History   Marital status: Married    Spouse name: Not on file   Number of children: 4   Years of education: Not on file   Highest education  level: Not on file  Occupational History   Occupation: retired    Fish farm manager: [employer] resource strain: Not on file   Food insecurity:    Worry: Not on file    Inability: Not on file   Transportation needs:    Medical: Not on file    Non-medical: Not on file  Tobacco Use   Smoking status: Former Smoker    Packs/day: 2.00    Years: 20.00    Pack years: 40.00    Types: Cigarettes    Last attempt to quit: 07/05/1974    Years since quitting: 44.2   Smokeless tobacco: Never Used  Substance and Sexual Activity   Alcohol use: Yes    Alcohol/week: 0.0 standard drinks    Comment: socially; also 1-2 glasses of wine or beer every other day  Drug use: No   Sexual activity: Not Currently  Lifestyle   Physical activity:    Days per week: Not on file    Minutes per session: Not on file   Stress: Not on file  Relationships   Social connections:    Talks on phone: Not on file    Gets together: Not on file    Attends religious service: Not on file    Active member of club or organization: Not on file    Attends meetings of clubs or organizations: Not on file    Relationship status: Not on file   Intimate partner violence:    Fear of current or ex partner: Not on file    Emotionally abused: Not on file    Physically abused: Not on file    Forced sexual activity: Not on file  Other Topics Concern   Not on file  Social History Narrative   Married   Former Smoker    Alcohol use-yes 1-2 drinks per day      Occupation: Retired Midwife      Originally from [place], [place] - in [place] > 10 yrs as of 2017       ALLERGIES: Contrast media [iodinated diagnostic agents]  MEDICATIONS:  Current Outpatient Medications  Medication Sig Dispense Refill   allopurinol (ZYLOPRIM) 300 MG tablet TAKE 1 TABLET EVERY DAY 90 tablet 0   amLODipine (NORVASC) 10 MG tablet TAKE 1 TABLET EVERY DAY 90 tablet 0   Cholecalciferol (VITAMIN D) 2000 UNITS CAPS Take  1 capsule by mouth daily.     Cyanocobalamin (VITAMIN B-12) 5000 MCG TBDP Take 1 tablet by mouth daily.      Docusate Calcium (STOOL SOFTENER PO) Take 1 tablet by mouth daily.     fluticasone (FLONASE) 50 MCG/ACT nasal spray Place 2 sprays into both nostrils daily. 16 g 6   magnesium oxide (MAG-OX) 400 MG tablet Take 400 mg by mouth daily.     metoCLOPramide (REGLAN) 10 MG tablet qhs prn 90 tablet 0   Multiple Vitamins-Minerals (MULTIVITAMIN WITH MINERALS) tablet Take 1 tablet by mouth daily.     omeprazole (PRILOSEC) 40 MG capsule TAKE ONE CAPSULE BY MOUTH ONE TIME DAILY BEFORE MEAL (Patient taking differently: Take 40 mg by mouth daily. ) 90 capsule 0   Oxymetazoline HCl (AFRIN NASAL SPRAY NA) Place 1 Squirt into the nose at bedtime.     Potassium 99 MG TABS Take by mouth daily.     simvastatin (ZOCOR) 20 MG tablet TAKE 1 TABLET AT BEDTIME (Patient taking differently: Take 20 mg by mouth at bedtime. ) 90 tablet 1   tamsulosin (FLOMAX) 0.4 MG CAPS capsule Take 0.4 mg by mouth daily.      valsartan-hydrochlorothiazide (DIOVAN-HCT) 320-25 MG tablet TAKE 1 TABLET EVERY DAY 90 tablet 0   No current facility-administered medications for this encounter.     REVIEW OF SYSTEMS:  On review of systems, the patient reports that he is doing well overall. He denies any chest pain, shortness of breath, cough, fevers, chills, night sweats, unintended weight changes. He denies any bowel disturbances, and denies abdominal pain, nausea or vomiting. He denies any new musculoskeletal or joint aches or pains. His IPSS was 6, indicating mild urinary symptoms on Flomax. His SHIM was 1, indicating he has severe erectile dysfunction. A complete review of systems is obtained and is otherwise negative.    PHYSICAL EXAM:  Wt Readings from Last 3 Encounters:  10/03/18 250 lb (113.4 kg)  09/19/18 240 lb (108.9 kg)  09/19/18 252 lb (114.3 kg)   Temp Readings from Last 3 Encounters:  09/19/18 98.1 F (36.7  C)  08/08/18 98.3 F (36.8 C)  08/03/18 98.4 F (36.9 C) (Oral)   BP Readings from Last 3 Encounters:  09/19/18 120/68  08/31/18 122/68  08/24/18 (!) 144/78   Pulse Readings from Last 3 Encounters:  08/31/18 64  08/24/18 60  08/08/18 65   Pain Assessment Pain Score: 0-No pain/10  Unable to perform physical exam due to the fact that the patient did not have a WebCam available for face-to-face communication during this telemedicine consult (the patient was able to see [place] but we were unable to see the patient).   KPS = 90  100 - Normal; no complaints; no evidence of disease. 90   - Able to carry on normal activity; minor signs or symptoms of disease. 80   - Normal activity with effort; some signs or symptoms of disease. 52   - Cares for self; unable to carry on normal activity or to do active work. 60   - Requires occasional assistance, but is able to care for most of his personal needs. 50   - Requires considerable assistance and frequent medical care. 51   - Disabled; requires special care and assistance. 8   - Severely disabled; hospital admission is indicated although death not imminent. 5   - Very sick; hospital admission necessary; active supportive treatment necessary. 10   - Moribund; fatal processes progressing rapidly. 0     - Dead  Karnofsky DA, Abelmann [place], Craver LS and Burchenal [facility] [phone]) The use of the nitrogen mustards in the palliative treatment of carcinoma: with particular reference to bronchogenic carcinoma Cancer 1 634-56  LABORATORY DATA:  Lab Results  Component Value Date   WBC 5.6 08/02/2018   HGB 12.5 (L) 08/02/2018   HCT 38.6 (L) 08/02/2018   MCV 90.4 08/02/2018   PLT 192 08/02/2018   Lab Results  Component Value Date   NA 141 08/02/2018   K 4.0 08/02/2018   CL 107 08/02/2018   CO2 27 08/02/2018   Lab Results  Component Value Date   ALT 14 03/04/2017   AST 16 03/04/2017   ALKPHOS 57 03/04/2017   BILITOT 0.6 03/04/2017      RADIOGRAPHY: No results found.    IMPRESSION/PLAN: 1. 78 y.o. gentleman with Stage T1c adenocarcinoma of the prostate with Gleason Score of 3+4, and PSA of 9.03. We discussed the patient's workup and outlined the nature of prostate cancer in this setting. The patient's T stage, Gleason's score, and PSA put him into the favorable intermediate risk group. Accordingly, he is eligible for a variety of potential treatment options including brachytherapy or 5.5 weeks of external radiation. He is not felt to be an ideal prostatectomy candidate given his advanced age.  We discussed the available radiation techniques, and focused on the details of logistics and delivery. The patient is not felt to be an ideal candidate for brachytherapy with a prostate volume of 80.46 prior to downsizing from hormone therapy. We discussed that based on his prostate volume, he would require beginning treatment with a 5 alpha reductase inhibitor and ADT for at least 3 months to allow for downsizing of the prostate prior to initiating radiotherapy.  We discussed the utility of ADT in this setting as well as associated side effects that could be expected with this therapy.  We would need to re-evaluate his prostate size at  the time of CT simulation/treatment planning to ensure that his prostate size has decreased enough to meet the  recommended criteria of 60 g or less which would confirm that his is indeed a candidate to move forward with brachytherapy. We discussed and outlined the risks, benefits, short and long-term effects associated with radiotherapy and compared and contrasted these with prostatectomy. We also discussed the role of SpaceOAR in reducing the rectal toxicity associated with radiotherapy.  The patient and his daughter were encouraged to ask questions that were answered to their stated satisfaction.  At the end of the conversation the patient is interested in moving forward with ST-ADT and 5-ARI to reduce his prostate  volume in anticipation of proceeding with brachytherapy.  We will tentatively move forward with scheduling brachytherapy with SpaceOAR gel placement assuming he will be a candidate for this procedure after 3 months of treatment to downsize the prostate to 60 gm or less.  We will confirm that he meets brachytherapy criteria at the time of CT SIM/treatment planning prior to his procedure.  He is ammenable to proceed with 5.5 weeks of external beam therapy if he does not meet brachytherapy criteria after 3 months of treatment. We will share our discussion with Dr. Diona Fanti to make arrangements for start of ADT, first available. and move forward with treatment planning accordingly.   Given current concerns for patient exposure during the COVID-19 pandemic, this encounter was conducted via telephone. The patient was notified in advance and was offered a [place] meeting to allow for face to face communication but unfortunately he did not have a webcam available to allow for a true telemedicine consult (the patient was able to see [place] but we could not see the patient) but elected to proceed with the visit via WebEx telephone communication. The patient has given verbal consent for this type of encounter. The time spent during this encounter was 60 minutes. The attendants for this meeting include Tyler Pita MD, Ashlyn Bruning PA-C, [place], patient Marcus [name] and daughter, Jacqulyn Bath. During the encounter, Tyler Pita MD, Ashlyn Bruning PA-C, and scribe, Wilburn Mylar were located at [facility].  Patient Marcus [name] and daughter, Jacqulyn Bath, were located at home.    [name], PA-C    Tyler Pita, MD  [facility] Direct Dial: [phone]   Fax: [phone] [email or web address]   Skype   [email or web address]   This document serves as a record of services personally performed by Tyler Pita, MD and  Freeman Caldron, PA-C. It was created on their behalf by Wilburn Mylar, a trained medical scribe. The creation of this record is based on the scribe's personal observations and the provider's statements to them. This document has been checked and approved by the attending provider.

## 2018-10-03 NOTE — Progress Notes (Signed)
See progress note under physician encounter. 

## 2018-10-04 ENCOUNTER — Telehealth: Payer: Self-pay | Admitting: Radiation Oncology

## 2018-10-04 DIAGNOSIS — C61 Malignant neoplasm of prostate: Secondary | ICD-10-CM | POA: Insufficient documentation

## 2018-10-04 NOTE — Telephone Encounter (Signed)
Faxed pacemaker clearance form to Dr. Sullivan [facility] clinic to complete. Fax confirmation of delivery obtained.

## 2018-10-05 ENCOUNTER — Encounter: Payer: Self-pay | Admitting: Medical Oncology

## 2018-10-05 DIAGNOSIS — G4733 Obstructive sleep apnea (adult) (pediatric): Secondary | ICD-10-CM | POA: Diagnosis not present

## 2018-10-11 ENCOUNTER — Encounter: Payer: Self-pay | Admitting: Urology

## 2018-10-11 DIAGNOSIS — C61 Malignant neoplasm of prostate: Secondary | ICD-10-CM | POA: Diagnosis not present

## 2018-10-11 NOTE — Progress Notes (Signed)
Scheduled to start ADT with Dr. Diona Fanti 10/11/2018.

## 2018-10-16 DIAGNOSIS — C61 Malignant neoplasm of prostate: Secondary | ICD-10-CM | POA: Diagnosis not present

## 2018-10-17 ENCOUNTER — Ambulatory Visit: Payer: Medicare HMO | Admitting: Cardiology

## 2018-10-20 NOTE — Telephone Encounter (Signed)
We can wait until May. He is receiving ADT and will be delayed slightly anyway. Thank you. Sam

## 2018-10-24 ENCOUNTER — Telehealth: Payer: Self-pay

## 2018-10-24 NOTE — Telephone Encounter (Signed)
Left detailed message per DPR.  Advised f/u appt scheduled for April 30 was now rescheduled for December 07 2018 at 11:00 am with Dr. Lovena Le at [facility] office.    Left this message to call back if any concerns.

## 2018-11-02 ENCOUNTER — Encounter: Payer: Medicare HMO | Admitting: Internal Medicine

## 2018-11-04 DIAGNOSIS — G4733 Obstructive sleep apnea (adult) (pediatric): Secondary | ICD-10-CM | POA: Diagnosis not present

## 2018-11-05 ENCOUNTER — Encounter (HOSPITAL_BASED_OUTPATIENT_CLINIC_OR_DEPARTMENT_OTHER): Payer: Medicare HMO

## 2018-11-15 ENCOUNTER — Encounter: Payer: Self-pay | Admitting: Adult Health

## 2018-11-15 ENCOUNTER — Ambulatory Visit (INDEPENDENT_AMBULATORY_CARE_PROVIDER_SITE_OTHER): Payer: Medicare HMO | Admitting: Adult Health

## 2018-11-15 ENCOUNTER — Other Ambulatory Visit: Payer: Self-pay

## 2018-11-15 ENCOUNTER — Ambulatory Visit (INDEPENDENT_AMBULATORY_CARE_PROVIDER_SITE_OTHER): Payer: Medicare HMO

## 2018-11-15 ENCOUNTER — Telehealth: Payer: Self-pay | Admitting: Adult Health

## 2018-11-15 VITALS — BP 124/70 | Temp 98.4°F | Wt 254.0 lb

## 2018-11-15 DIAGNOSIS — M545 Low back pain, unspecified: Secondary | ICD-10-CM

## 2018-11-15 DIAGNOSIS — M25551 Pain in right hip: Secondary | ICD-10-CM

## 2018-11-15 DIAGNOSIS — M25552 Pain in left hip: Secondary | ICD-10-CM

## 2018-11-15 MED ORDER — CYCLOBENZAPRINE HCL 10 MG PO TABS: 10.0000 mg | ORAL_TABLET | Freq: Every evening | ORAL | 0 refills | Status: DC | PRN

## 2018-11-15 NOTE — Telephone Encounter (Signed)
Updated patient on his x-rays.  Does have a small fracture to the right lateral spur of L3.  Advised that I am not convinced that this is where his pain is coming from and I believe his pain is more muscular in nature.  He is okay with using Flexeril nightly for the next few days, he was advised that this can make him feel sleepy so to be careful with ambulation  Fracture through a right lateral spur along the inferior endplate of L3. No involvement of the vertebral body.

## 2018-11-15 NOTE — Progress Notes (Addendum)
Subjective:    Patient ID: Marcus [name], male    DOB: 1941-03-11, 78 y.o.   MRN: [id number]  HPI   78 year old male who  has a past medical history of Acute meniscal tear of knee (left), Arthritis, Calcaneal spur, Colon cancer ([place]) (1983), Coronary artery disease, Diverticulosis of colon (without mention of hemorrhage), Echocardiogram findings abnormal, without diagnosis (04-23-2010), Edema of lower extremity, Elevated prostate specific antigen (PSA), Esophageal reflux, External hemorrhoids, Frequency of urination, Gout, Gout, unspecified, Hiatal hernia, History of colon cancer (1988), Hyperlipidemia, Lumbar spondylolysis, Normal nuclear stress test (04-23-2010), OSA (obstructive sleep apnea), Paraesophageal hernia, Personal history of colonic polyps (08/21/2009), Pneumonia, Postgastric surgery syndromes, Prostate cancer ([place]) (2016), Unspecified essential hypertension, Unspecified vitamin D deficiency, Ventral hernia, and Vitamin B12 deficiency.  He presents to the office today for an acute issue of lower back and bilateral hip pain.  He reports that he was on a ladder in his garag and sustained approximately 3 foot fall.  He fell onto his lower back and right side.  He denies hitting his head or having any loss of consciousness.  The fall happened approximately 5 days ago.  Has been ambulatory since.  Has been taking Tylenol as needed throughout the day which helps with his pain.  Also reports that warm showers help with discomfort.  Pain is worse with ambulation as well as sitting for extended periods of time  Review of Systems See HPI   Past Medical History:  Diagnosis Date  . Acute meniscal tear of knee left  . Arthritis    generalized  . Calcaneal spur   . Colon cancer [facility]) 1983   surgical removed. did not receive radiation or chemotherapy.  . Coronary artery disease    dr Stanford Breed  . Diverticulosis of colon (without mention of hemorrhage)   . Echocardiogram findings abnormal,  without diagnosis 04-23-2010   normal LV function, mod. left atrial enlargement, mild right artrial enlargement and grade 1 diastolic dysfunction  . Edema of lower extremity    ankles, elevates legs  . Elevated prostate specific antigen (PSA)   . Esophageal reflux   . External hemorrhoids   . Frequency of urination    followed by dr dalhstedt  . Gout   . Gout, unspecified   . Hiatal hernia   . History of colon cancer 1988  . Hyperlipidemia   . Lumbar spondylolysis   . Normal nuclear stress test 04-23-2010   ef 51%,  septal hypokinesis, normal perfusion  . OSA (obstructive sleep apnea)    does not use CPAP   . Paraesophageal hernia    large  . Personal history of colonic polyps 08/21/2009   TUBULAR ADENOMAS  . Pneumonia    history of; last episode 2015  . Postgastric surgery syndromes   . Prostate cancer ([place]) 2016  . Unspecified essential hypertension   . Unspecified vitamin D deficiency   . Ventral hernia   . Vitamin B12 deficiency     Social History   Socioeconomic History  . Marital status: Married    Spouse name: Not on file  . Number of children: 4  . Years of education: Not on file  . Highest education level: Not on file  Occupational History  . Occupation: retired    Fish farm manager: [employer]  . Financial resource strain: Not on file  . Food insecurity:    Worry: Not on file    Inability: Not on file  . Transportation  needs:    Medical: Not on file    Non-medical: Not on file  Tobacco Use  . Smoking status: Former Smoker    Packs/day: 2.00    Years: 20.00    Pack years: 40.00    Types: Cigarettes    Last attempt to quit: 07/05/1974    Years since quitting: 44.3  . Smokeless tobacco: Never Used  Substance and Sexual Activity  . Alcohol use: Yes    Alcohol/week: 0.0 standard drinks    Comment: socially; also 1-2 glasses of wine or beer every other day   . Drug use: No  . Sexual activity: Not Currently  Lifestyle  . Physical  activity:    Days per week: Not on file    Minutes per session: Not on file  . Stress: Not on file  Relationships  . Social connections:    Talks on phone: Not on file    Gets together: Not on file    Attends religious service: Not on file    Active member of club or organization: Not on file    Attends meetings of clubs or organizations: Not on file    Relationship status: Not on file  . Intimate partner violence:    Fear of current or ex partner: Not on file    Emotionally abused: Not on file    Physically abused: Not on file    Forced sexual activity: Not on file  Other Topics Concern  . Not on file  Social History Narrative   Married   Former Smoker    Alcohol use-yes 1-2 drinks per day      Occupation: Retired Midwife      Originally from [place], [place] - in [place] > 10 yrs as of 2017       Past Surgical History:  Procedure Laterality Date  . CARDIAC CATHETERIZATION    . CATARACT EXTRACTION W/ INTRAOCULAR LENS  IMPLANT, BILATERAL Bilateral   . COLONOSCOPY    . ESOPHAGOGASTRODUODENOSCOPY    . KNEE ARTHROSCOPY Right 2007  . KNEE ARTHROSCOPY  07/13/2011   Procedure: ARTHROSCOPY KNEE;  Surgeon: Cynda Familia;  Location: [place];  Service: Orthopedics;  Laterality: Left;  partial menisectomy with chondrylplasty  . KNEE SURGERY Left   . LAMINECTOMY AND MICRODISCECTOMY LUMBAR SPINE  MARCH  2008   L3 -  4  . LAPAROSCOPIC INCISIONAL / UMBILICAL / VENTRAL HERNIA REPAIR  2006  . PACEMAKER IMPLANT N/A 08/02/2018   Procedure: PACEMAKER IMPLANT;  Surgeon: Evans Lance, MD;  Location: [facility];  Service: Cardiovascular;  Laterality: N/A;  . PROSTATE BIOPSY    . SHOULDER ARTHROSCOPY Right 05-23-2007  . SIGMOID COLECTOMY FOR CANCER  1989  . TOTAL KNEE ARTHROPLASTY Left 05/23/2015   Procedure: LEFT TOTAL KNEE ARTHROPLASTY;  Surgeon: Sydnee Cabal, MD;  [facility];  Service: Orthopedics;  Laterality: Left;  [name] Kitchen VERTICAL BANDED GASTROPLASTY   1986    Family History  Problem Relation Age of Onset  . Stroke Paternal Grandfather   . Heart disease Paternal Grandfather   . Esophagitis Father        died from perforated esophagus  . Breast cancer Mother   . Colon cancer Maternal Grandmother        questionable    Allergies  Allergen Reactions  . Contrast Media [Iodinated Diagnostic Agents] Palpitations    TACHYCARDIA- patient states he has tolerated newer agents since this reaction >30 yrs ago    Current Outpatient  Medications on File Prior to Visit  Medication Sig Dispense Refill  . allopurinol (ZYLOPRIM) 300 MG tablet TAKE 1 TABLET EVERY DAY 90 tablet 0  . amLODipine (NORVASC) 10 MG tablet TAKE 1 TABLET EVERY DAY 90 tablet 0  . Cholecalciferol (VITAMIN D) 2000 UNITS CAPS Take 1 capsule by mouth daily.    . Cyanocobalamin (VITAMIN B-12) 5000 MCG TBDP Take 1 tablet by mouth daily.     [name] Calcium (STOOL SOFTENER PO) Take 1 tablet by mouth daily.    . fluticasone (FLONASE) 50 MCG/ACT nasal spray Place 2 sprays into both nostrils daily. 16 g 6  . magnesium oxide (MAG-OX) 400 MG tablet Take 400 mg by mouth daily.    . metoCLOPramide (REGLAN) 10 MG tablet qhs prn 90 tablet 0  . Multiple Vitamins-Minerals (MULTIVITAMIN WITH MINERALS) tablet Take 1 tablet by mouth daily.    [name] Kitchen omeprazole (PRILOSEC) 40 MG capsule TAKE ONE CAPSULE BY MOUTH ONE TIME DAILY BEFORE MEAL (Patient taking differently: Take 40 mg by mouth daily. ) 90 capsule 0  . Oxymetazoline HCl (AFRIN NASAL SPRAY NA) Place 1 Squirt into the nose at bedtime.    . Potassium 99 MG TABS Take by mouth daily.    . simvastatin (ZOCOR) 20 MG tablet TAKE 1 TABLET AT BEDTIME (Patient taking differently: Take 20 mg by mouth at bedtime. ) 90 tablet 1  . tamsulosin (FLOMAX) 0.4 MG CAPS capsule Take 0.4 mg by mouth daily.     . valsartan-hydrochlorothiazide (DIOVAN-HCT) 320-25 MG tablet TAKE 1 TABLET EVERY DAY 90 tablet 0   No current facility-administered medications on file  prior to visit.     BP 124/70   Temp 98.4 F (36.9 C)   Wt 254 lb (115.2 kg)   BMI 38.62 kg/m       Objective:   Physical Exam Vitals signs and nursing note reviewed.  Constitutional:      Appearance: Normal appearance.  Cardiovascular:     Rate and Rhythm: Normal rate and regular rhythm.     Pulses: Normal pulses.     Heart sounds: Normal heart sounds.  Musculoskeletal:        General: Tenderness present. No swelling.     Lumbar back: He exhibits no tenderness, no bony tenderness, no swelling, no edema, no deformity and no pain.     Comments: Tenderness to right lower back and bilateral hips.  No bruising or abrasions noted  Skin:    General: Skin is warm and dry.     Capillary Refill: Capillary refill takes less than 2 seconds.     Findings: No bruising.  Neurological:     General: No focal deficit present.     Mental Status: He is alert and oriented to person, place, and time.  Psychiatric:        Mood and Affect: Mood normal.        Behavior: Behavior normal.        Thought Content: Thought content normal.        Judgment: Judgment normal.       Assessment & Plan:  Likely muscular pain.  Will get x-rays of lower lumbar spine as well as right hip and pelvis.  Consider low-dose muscle relaxer nightly.  He can continue with Tylenol okay to supplement with anti-inflammatory such as Aleve or Motrin as needed.   1. Acute right-sided low back pain without sciatica  - DG Lumbar Spine Complete; Future  2. Pain of both hip joints  -  DG Hip Unilat W OR W/O Pelvis 2-3 Views Right; Future  Dorothyann Peng, NP

## 2018-11-17 ENCOUNTER — Other Ambulatory Visit: Payer: Self-pay

## 2018-11-17 ENCOUNTER — Ambulatory Visit (INDEPENDENT_AMBULATORY_CARE_PROVIDER_SITE_OTHER): Payer: Medicare HMO | Admitting: *Deleted

## 2018-11-17 DIAGNOSIS — I495 Sick sinus syndrome: Secondary | ICD-10-CM | POA: Diagnosis not present

## 2018-11-17 DIAGNOSIS — C61 Malignant neoplasm of prostate: Secondary | ICD-10-CM | POA: Diagnosis not present

## 2018-11-19 LAB — CUP PACEART REMOTE DEVICE CHECK
Date Time Interrogation Session: 20200517073134
Implantable Lead Implant Date: 20200129
Implantable Lead Implant Date: 20200129
Implantable Lead Location: 753859
Implantable Lead Location: 753860
Implantable Lead Model: 377
Implantable Lead Model: 377
Implantable Lead Serial Number: 80947537
Implantable Lead Serial Number: 80970966
Implantable Pulse Generator Implant Date: 20200129
Pulse Gen Model: 407145
Pulse Gen Serial Number: 69503797

## 2018-11-20 ENCOUNTER — Encounter: Payer: Self-pay | Admitting: Cardiology

## 2018-11-20 ENCOUNTER — Telehealth: Payer: Self-pay | Admitting: Adult Health

## 2018-11-20 NOTE — Telephone Encounter (Signed)
PA started today for the flexeril.   [employer] approved this medication from 07/05/2018--07/04/2019.  Pharmacy has been made aware.

## 2018-11-20 NOTE — Progress Notes (Signed)
Remote pacemaker transmission.   

## 2018-11-22 ENCOUNTER — Telehealth: Payer: Self-pay | Admitting: Pulmonary Disease

## 2018-11-22 DIAGNOSIS — G4733 Obstructive sleep apnea (adult) (pediatric): Secondary | ICD-10-CM

## 2018-11-22 NOTE — Telephone Encounter (Signed)
Pt states he feels pressure is to low: CPAP therapy on 8 cm H2O He says cpap is making clicking sound. He is having trouble sleeping with it. Compliance report has been printed.

## 2018-11-22 NOTE — Telephone Encounter (Signed)
Order placed  Pt states he will call back and schedule f/u towards end of June. He wants to try to adjust to new pressure. Nothing further needed.

## 2018-11-22 NOTE — Telephone Encounter (Signed)
LMTCB Need more info on reason for pressure change?

## 2018-11-22 NOTE — Telephone Encounter (Signed)
Patient is returning phone call.  Patient phone number is [phone].

## 2018-11-22 NOTE — Telephone Encounter (Signed)
May increase pressure to 9 cmH20. Please have DME check machine to see why it is making noises. Please advise patient that he needs to wear machine nightly for more than 4 hours each night. He has not been wearing machine nightly and only for around 2-3 hours when he does wear it. Dr. Elsworth Soho wanted him to follow up in June. Please make follow up appointment. Thanks.

## 2018-12-04 ENCOUNTER — Telehealth: Payer: Self-pay | Admitting: Adult Health

## 2018-12-04 NOTE — Telephone Encounter (Signed)
Patient has a lump under his chin that keeps get big then small for the past 2 days. He has an infected tooth that is getting extracted Thursday. He stated that he also has prostate cancer. He would like to come into the office tomorrow and not do a virtual visit. I had scheduled him for tomorrow at 11:30 AM. I did explain to him that Tommi Rumps is only doing virtual visits till Thursday but he is having a tooth extracted and would like to come in tomorrow.  Please Advise

## 2018-12-05 ENCOUNTER — Ambulatory Visit: Payer: Medicare HMO | Admitting: Adult Health

## 2018-12-05 ENCOUNTER — Telehealth: Payer: Self-pay

## 2018-12-05 DIAGNOSIS — G4733 Obstructive sleep apnea (adult) (pediatric): Secondary | ICD-10-CM | POA: Diagnosis not present

## 2018-12-05 NOTE — Telephone Encounter (Signed)
Noted.  Appt scheduled for 12/06/2018

## 2018-12-05 NOTE — Telephone Encounter (Signed)
Spoke with pt regarding covid-19 screening prior to appt. Pt stated he has not been in contact with anyone who may have covid-19 and have no symptoms.

## 2018-12-06 ENCOUNTER — Encounter (HOSPITAL_BASED_OUTPATIENT_CLINIC_OR_DEPARTMENT_OTHER): Payer: Self-pay | Admitting: Emergency Medicine

## 2018-12-06 ENCOUNTER — Emergency Department (HOSPITAL_BASED_OUTPATIENT_CLINIC_OR_DEPARTMENT_OTHER)
Admission: EM | Admit: 2018-12-06 | Discharge: 2018-12-06 | Disposition: A | Discharge status: Home / Self Care | Payer: Medicare HMO | Attending: Emergency Medicine | Admitting: Emergency Medicine

## 2018-12-06 ENCOUNTER — Telehealth: Payer: Self-pay | Admitting: Adult Health

## 2018-12-06 ENCOUNTER — Emergency Department (HOSPITAL_BASED_OUTPATIENT_CLINIC_OR_DEPARTMENT_OTHER): Payer: Medicare HMO

## 2018-12-06 ENCOUNTER — Ambulatory Visit: Payer: Medicare HMO | Admitting: Adult Health

## 2018-12-06 ENCOUNTER — Other Ambulatory Visit: Payer: Self-pay

## 2018-12-06 ENCOUNTER — Other Ambulatory Visit: Payer: Self-pay | Admitting: Adult Health

## 2018-12-06 DIAGNOSIS — J069 Acute upper respiratory infection, unspecified: Secondary | ICD-10-CM | POA: Diagnosis not present

## 2018-12-06 DIAGNOSIS — Z96652 Presence of left artificial knee joint: Secondary | ICD-10-CM | POA: Diagnosis not present

## 2018-12-06 DIAGNOSIS — I2510 Atherosclerotic heart disease of native coronary artery without angina pectoris: Secondary | ICD-10-CM | POA: Diagnosis not present

## 2018-12-06 DIAGNOSIS — Z8546 Personal history of malignant neoplasm of prostate: Secondary | ICD-10-CM | POA: Insufficient documentation

## 2018-12-06 DIAGNOSIS — Z79899 Other long term (current) drug therapy: Secondary | ICD-10-CM | POA: Insufficient documentation

## 2018-12-06 DIAGNOSIS — Z20828 Contact with and (suspected) exposure to other viral communicable diseases: Secondary | ICD-10-CM | POA: Diagnosis not present

## 2018-12-06 DIAGNOSIS — Z87891 Personal history of nicotine dependence: Secondary | ICD-10-CM | POA: Diagnosis not present

## 2018-12-06 DIAGNOSIS — R05 Cough: Secondary | ICD-10-CM | POA: Diagnosis not present

## 2018-12-06 DIAGNOSIS — Z950 Presence of cardiac pacemaker: Secondary | ICD-10-CM | POA: Diagnosis not present

## 2018-12-06 DIAGNOSIS — Z85038 Personal history of other malignant neoplasm of large intestine: Secondary | ICD-10-CM | POA: Diagnosis not present

## 2018-12-06 DIAGNOSIS — R0602 Shortness of breath: Secondary | ICD-10-CM | POA: Diagnosis present

## 2018-12-06 LAB — COMPREHENSIVE METABOLIC PANEL
ALT: 22 U/L (ref 0–44)
AST: 25 U/L (ref 15–41)
Albumin: 3.8 g/dL (ref 3.5–5.0)
Alkaline Phosphatase: 48 U/L (ref 38–126)
Anion gap: 10 (ref 5–15)
BUN: 27 mg/dL — ABNORMAL HIGH (ref 8–23)
CO2: 23 mmol/L (ref 22–32)
Calcium: 9.5 mg/dL (ref 8.9–10.3)
Chloride: 105 mmol/L (ref 98–111)
Creatinine, Ser: 1 mg/dL (ref 0.61–1.24)
GFR calc Af Amer: >60 mL/min (ref >60)
GFR calc non Af Amer: >60 mL/min (ref >60)
Glucose, Bld: 124 mg/dL — ABNORMAL HIGH (ref 70–99)
Potassium: 3.7 mmol/L (ref 3.5–5.1)
Sodium: 138 mmol/L (ref 135–145)
Total Bilirubin: 0.6 mg/dL (ref 0.3–1.2)
Total Protein: 7.2 g/dL (ref 6.5–8.1)

## 2018-12-06 LAB — SARS CORONAVIRUS 2 AG (30 MIN TAT): SARS Coronavirus 2 Ag: NEGATIVE

## 2018-12-06 LAB — CBC
HCT: 39.2 % (ref 39.0–52.0)
Hemoglobin: 12.5 g/dL — ABNORMAL LOW (ref 13.0–17.0)
MCH: 28.9 pg (ref 26.0–34.0)
MCHC: 31.9 g/dL (ref 30.0–36.0)
MCV: 90.7 fL (ref 80.0–100.0)
Platelets: 242 10*3/uL (ref 150–400)
RBC: 4.32 MIL/uL (ref 4.22–5.81)
RDW: 14.3 % (ref 11.5–15.5)
WBC: 14 10*3/uL — ABNORMAL HIGH (ref 4.0–10.5)
nRBC: 0 % (ref 0.0–0.2)

## 2018-12-06 MED ORDER — SODIUM CHLORIDE 0.9 % IV BOLUS
500.0000 mL | Freq: Once | INTRAVENOUS | Status: AC
  Administered 2018-12-06: 500 mL via INTRAVENOUS

## 2018-12-06 NOTE — Telephone Encounter (Signed)
Patient was scheduled for office visit today. Covid Screening was done in the parking lot and patient had a low grade fever of 99.8. He reports 2-3 days of fatigue, body aches, fevers, shortness of breath and productive cough. Unable to bring into the office for evaluation at this time. He was advised to go to [facility] for further evaluation to which he advised this writer that he would go

## 2018-12-06 NOTE — ED Notes (Signed)
Pt reported feeliing weakness and dizziness upon standing

## 2018-12-06 NOTE — ED Provider Notes (Signed)
[facility] EMERGENCY DEPARTMENT Provider Note   CSN: [id number] Arrival date & time: 12/06/18  0840    History   Chief Complaint Chief Complaint  Patient presents with  . Fever  . Cough  . Weakness  . Dizziness    HPI Marcus [name] is a 78 y.o. male.     HPI Patient presents with shortness of breath and cough.  Low-grade temperature.  Came from primary care doctor today.  For the last couple days has had a cough and feeling weak.  States he feels dizzy when he stands.  States he has some congestion in his upper chest.  No known sick contacts.  No known COVID contacts.  No chest pain.  Mild swelling in his legs. Past Medical History:  Diagnosis Date  . Acute meniscal tear of knee left  . Arthritis    generalized  . Calcaneal spur   . Colon cancer [facility]) 1983   surgical removed. did not receive radiation or chemotherapy.  . Coronary artery disease    dr Stanford Breed  . Diverticulosis of colon (without mention of hemorrhage)   . Echocardiogram findings abnormal, without diagnosis 04-23-2010   normal LV function, mod. left atrial enlargement, mild right artrial enlargement and grade 1 diastolic dysfunction  . Edema of lower extremity    ankles, elevates legs  . Elevated prostate specific antigen (PSA)   . Esophageal reflux   . External hemorrhoids   . Frequency of urination    followed by dr dalhstedt  . Gout   . Gout, unspecified   . Hiatal hernia   . History of colon cancer 1988  . Hyperlipidemia   . Lumbar spondylolysis   . Normal nuclear stress test 04-23-2010   ef 51%,  septal hypokinesis, normal perfusion  . OSA (obstructive sleep apnea)    does not use CPAP   . Paraesophageal hernia    large  . Personal history of colonic polyps 08/21/2009   TUBULAR ADENOMAS  . Pneumonia    history of; last episode 2015  . Postgastric surgery syndromes   . Prostate cancer ([place]) 2016  . Unspecified essential hypertension   . Unspecified vitamin D deficiency   .  Ventral hernia   . Vitamin B12 deficiency     Patient Active Problem List   Diagnosis Date Noted  . Malignant neoplasm of prostate ([place]) 10/04/2018  . Syncope 08/01/2018  . AV block 08/01/2018  . Depression, recurrent ([place]) 12/23/2017  . Degeneration of lumbar intervertebral disc 10/14/2017  . Lumbar post-laminectomy syndrome 10/14/2017  . Primary osteoarthritis of knee 05/23/2015  . S/P knee replacement 05/23/2015  . Chronic cough 10/07/2014  . Chest pain, atypical 08/23/2014  . Elevated PSA 06/19/2014  . Preventative health care 06/19/2014  . Temporal headache 09/26/2013  . Adjustment disorder with depressed mood 09/26/2013  . Osteoarthritis of left knee 03/12/2013  . Lumbar strain 12/14/2011  . Skin lesion of face 07/23/2011  . Lump in chest 03/31/2011  . Iron deficiency anemia, unspecified  01/19/2011  . Hemorrhoids 01/19/2011  . Bradycardia 01/08/2011  . UNSPECIFIED VITAMIN D DEFICIENCY 10/28/2009  . CALCANEAL SPUR 03/31/2009  . Hyperlipidemia 07/26/2008  . Obesity 10/17/2007  . INSOMNIA UNSPECIFIED 08/21/2007  . GOUT NOS 01/18/2007  . SLEEP APNEA, OBSTRUCTIVE 01/18/2007  . Essential hypertension 01/18/2007  . GERD 01/18/2007  . COLON CANCER, HX OF 01/18/2007  . COLONIC POLYPS, HX OF 01/18/2007  . HIATAL HERNIA 10/26/2006  . DIVERTICULOSIS, COLON 07/07/2005    Past Surgical  History:  Procedure Laterality Date  . CARDIAC CATHETERIZATION    . CATARACT EXTRACTION W/ INTRAOCULAR LENS  IMPLANT, BILATERAL Bilateral   . COLONOSCOPY    . ESOPHAGOGASTRODUODENOSCOPY    . KNEE ARTHROSCOPY Right 2007  . KNEE ARTHROSCOPY  07/13/2011   Procedure: ARTHROSCOPY KNEE;  Surgeon: Cynda Familia;  Location: [place] [facility];  Service: Orthopedics;  Laterality: Left;  partial menisectomy with chondrylplasty  . KNEE SURGERY Left   . LAMINECTOMY AND MICRODISCECTOMY LUMBAR SPINE  MARCH  2008   L3 -  4  . LAPAROSCOPIC INCISIONAL / UMBILICAL / VENTRAL HERNIA REPAIR   2006  . PACEMAKER IMPLANT N/A 08/02/2018   Procedure: PACEMAKER IMPLANT;  Surgeon: Evans Lance, MD;  Location: [facility];  Service: Cardiovascular;  Laterality: N/A;  . PROSTATE BIOPSY    . SHOULDER ARTHROSCOPY Right 05-23-2007  . SIGMOID COLECTOMY FOR CANCER  1989  . TOTAL KNEE ARTHROPLASTY Left 05/23/2015   Procedure: LEFT TOTAL KNEE ARTHROPLASTY;  Surgeon: Sydnee Cabal, MD;  Location[facility];  Service: Orthopedics;  Laterality: Left;  [name] Kitchen VERTICAL BANDED GASTROPLASTY  1986        Home Medications    Prior to Admission medications   Medication Sig Start Date End Date Taking? Authorizing Provider  allopurinol (ZYLOPRIM) 300 MG tablet TAKE 1 TABLET EVERY DAY 09/12/18  Yes Nafziger, Tommi Rumps, NP  amLODipine (NORVASC) 10 MG tablet TAKE 1 TABLET EVERY DAY 09/12/18  Yes Nafziger, Tommi Rumps, NP  Cholecalciferol (VITAMIN D) 2000 UNITS CAPS Take 1 capsule by mouth daily.   Yes [provider]  Cyanocobalamin (VITAMIN B-12) 5000 MCG TBDP Take 1 tablet by mouth daily.    Yes [provider]  cyclobenzaprine (FLEXERIL) 10 MG tablet Take 1 tablet (10 mg total) by mouth at bedtime as needed for up to 15 doses for muscle spasms. 11/15/18  Yes Nafziger, Tommi Rumps, NP  Docusate Calcium (STOOL SOFTENER PO) Take 1 tablet by mouth daily.   Yes [provider]  fluticasone (FLONASE) 50 MCG/ACT nasal spray Place 2 sprays into both nostrils daily. 08/08/18  Yes Nafziger, Tommi Rumps, NP  magnesium oxide (MAG-OX) 400 MG tablet Take 400 mg by mouth daily.   Yes [provider]  metoCLOPramide (REGLAN) 10 MG tablet qhs prn 08/31/18  Yes Gatha Mayer, MD  Multiple Vitamins-Minerals (MULTIVITAMIN WITH MINERALS) tablet Take 1 tablet by mouth daily.   Yes [provider]  omeprazole (PRILOSEC) 40 MG capsule TAKE ONE CAPSULE BY MOUTH ONE TIME DAILY BEFORE MEAL Patient taking differently: Take 40 mg by mouth daily.  12/01/17  Yes Nafziger, Tommi Rumps, NP  Oxymetazoline HCl (AFRIN NASAL  SPRAY NA) Place 1 Squirt into the nose at bedtime.   Yes [provider]  Potassium 99 MG TABS Take by mouth daily.   Yes [provider]  simvastatin (ZOCOR) 20 MG tablet TAKE 1 TABLET AT BEDTIME Patient taking differently: Take 20 mg by mouth at bedtime.  02/07/18  Yes Nafziger, Tommi Rumps, NP  tamsulosin (FLOMAX) 0.4 MG CAPS capsule Take 0.4 mg by mouth daily.  03/04/16  Yes [provider]  valsartan-hydrochlorothiazide (DIOVAN-HCT) 320-25 MG tablet TAKE 1 TABLET EVERY DAY 09/12/18  Yes Nafziger, Tommi Rumps, NP    Family History Family History  Problem Relation Age of Onset  . Stroke Paternal Grandfather   . Heart disease Paternal Grandfather   . Esophagitis Father        died from perforated esophagus  . Breast cancer Mother   . Colon  cancer Maternal Grandmother        questionable    Social History Social History   Tobacco Use  . Smoking status: Former Smoker    Packs/day: 2.00    Years: 20.00    Pack years: 40.00    Types: Cigarettes    Last attempt to quit: 07/05/1974    Years since quitting: 44.4  . Smokeless tobacco: Never Used  Substance Use Topics  . Alcohol use: Yes    Alcohol/week: 0.0 standard drinks    Comment: socially; also 1-2 glasses of wine or beer every other day   . Drug use: No     Allergies   Contrast media [iodinated diagnostic agents]   Review of Systems Review of Systems  Constitutional: Positive for appetite change and fatigue.  Respiratory: Positive for cough and shortness of breath.   Cardiovascular: Negative for chest pain.  Gastrointestinal: Negative for abdominal pain.  Genitourinary: Negative for flank pain.  Musculoskeletal: Negative for back pain.  Skin: Negative for rash.  Neurological: Positive for dizziness and headaches.     Physical Exam Updated Vital Signs BP 120/64   Pulse 81   Temp 99.1 F (37.3 C)   Resp 10   Ht 5\' 9"  (1.753 m)   Wt 115.2 kg   SpO2 95%   BMI 37.51 kg/m   Physical Exam Vitals  signs and nursing note reviewed.  HENT:     Head: Normocephalic.  Cardiovascular:     Rate and Rhythm: Regular rhythm.  Pulmonary:     Effort: No respiratory distress.     Comments: Mildly harsh breath sounds Abdominal:     General: There is no distension.  Musculoskeletal:     Right lower leg: Edema present.     Left lower leg: Edema present.  Skin:    General: Skin is warm.     Capillary Refill: Capillary refill takes less than 2 seconds.  Neurological:     Mental Status: He is alert.      ED Treatments / Results  Labs (all labs ordered are listed, but only abnormal results are displayed) Labs Reviewed  COMPREHENSIVE METABOLIC PANEL - Abnormal; Notable for the following components:      Result Value   Glucose, Bld 124 (*)    BUN 27 (*)    All other components within normal limits  CBC - Abnormal; Notable for the following components:   WBC 14.0 (*)    Hemoglobin 12.5 (*)    All other components within normal limits  SARS CORONAVIRUS 2 (HOSP ORDER, PERFORMED IN [place] [facility] VIA ABBOTT ID)  NOVEL CORONAVIRUS, NAA (HOSPITAL ORDER, SEND-OUT TO REF LAB)    EKG None  Radiology Dg Chest Portable 1 View  Result Date: 12/06/2018 CLINICAL DATA:  Productive cough, fever. EXAM: PORTABLE CHEST 1 VIEW COMPARISON:  Radiographs of August 03, 2018. FINDINGS: Stable cardiomediastinal silhouette. Left-sided pacemaker is unchanged in position. No pneumothorax is noted. Mild bibasilar subsegmental atelectasis is noted. Bony thorax is unremarkable. IMPRESSION: Mild bibasilar subsegmental atelectasis. Electronically Signed   By: Marijo Conception M.D.   On: 12/06/2018 09:55    Procedures Procedures (including critical care time)  Medications Ordered in ED Medications  sodium chloride 0.9 % bolus 500 mL (0 mLs Intravenous Stopped 12/06/18 1134)     Initial Impression / Assessment and Plan / ED Course  I have reviewed the triage vital signs and the nursing notes.  Pertinent labs &  imaging results that were available during my  care of the patient were reviewed by me and considered in my medical decision making (see chart for details).        Patient with shortness of breath and cough.  No desaturation.  Feels somewhat dizzy.  Reportedly fevers at home.  Has negative COVID test here however it is not very accurate.  Will repeat as an [facility] test that will result in a day or 2.  X-ray does not show pneumonia.  Feels better after IV fluids.  Discharge home.  Final Clinical Impressions(s) / ED Diagnoses   Final diagnoses:  Upper respiratory tract infection, unspecified type    ED Discharge Orders    None       Davonna Belling, MD 12/06/18 1213

## 2018-12-06 NOTE — Telephone Encounter (Signed)
Pt called to let Marcus [name] know that he went to the emergency room and they did a lot of testing and they ruled out that he does not have pneumonia or the virus. Pt insisted on being tested for the COVID so they told him that they will do a sophisticated COVID test and the results will be back on Monday.  Pt state that he will call when he gets the results.

## 2018-12-06 NOTE — ED Triage Notes (Signed)
Pt here from primary care with fever, productive cough with tenacious secretions, and dizziness x 2days.

## 2018-12-06 NOTE — ED Notes (Signed)
Pt was ambulated with pulse ox with no evidence of desat. Pt reported feeling quite dizzy with slightly unsteady gait.

## 2018-12-06 NOTE — Discharge Instructions (Addendum)
You have had another COVID test done.  You should hear in 1 to 2 days if it was positive.

## 2018-12-07 ENCOUNTER — Encounter: Payer: Medicare HMO | Admitting: Internal Medicine

## 2018-12-08 LAB — NOVEL CORONAVIRUS, NAA (HOSP ORDER, SEND-OUT TO REF LAB; TAT 18-24 HRS): SARS-CoV-2, NAA: NOT DETECTED

## 2018-12-12 ENCOUNTER — Telehealth: Payer: Self-pay | Admitting: *Deleted

## 2018-12-12 NOTE — Telephone Encounter (Signed)
Copied from [place] [id number]. Topic: Quick Communication - Lab Results (Clinic Use ONLY) >> Dec 12, 2018  7:17 AM Scherrie Gerlach wrote: Pt would like results of his covid test done 12/06/18. Pt states his wife is dying and they have agreed to let hm come in to see her, but he needs these results first. Pt states he has had a terrible time trying to get anyone to talk to him.   Pt ask Tommi Rumps to give him a call. Pt very upset about his wife and would like results asap thank you   Clinic RN contacted patient. Informed him of negative COVID-19 results. Patient asked for a copy. Informed patient results will be up front. Patient verbalized understanding.

## 2018-12-19 ENCOUNTER — Telehealth: Payer: Self-pay | Admitting: *Deleted

## 2018-12-19 NOTE — Telephone Encounter (Signed)
CALLED PATIENT TO REMIND OF PRE-SEED APPTS. ON 12-21-18 AND HIS IMPLANT ON 01-15-19, SPOKE WITH PATIENT AND HE IS AWARE OF THESE APPTS.

## 2018-12-20 ENCOUNTER — Other Ambulatory Visit: Payer: Self-pay | Admitting: Adult Health

## 2018-12-20 ENCOUNTER — Telehealth: Payer: Self-pay | Admitting: Family Medicine

## 2018-12-20 MED ORDER — ALPRAZOLAM 0.25 MG PO TABS: 0.2500 mg | ORAL_TABLET | Freq: Two times a day (BID) | ORAL | 0 refills | Status: AC | PRN

## 2018-12-20 MED ORDER — AZELASTINE HCL 0.1 % NA SOLN: 2.0000 | Freq: Two times a day (BID) | NASAL | 3 refills | Status: DC

## 2018-12-20 NOTE — Telephone Encounter (Signed)
Copied from [place] [id number]. Topic: General - Other >> Dec 20, 2018 12:50 PM Lennox Solders wrote: Reason for CRM: pt daughter laura Angert forde is calling and would like cory to return her call. Pt is at nursing home visiting his wife who does not have long to live.  About 2 to 3 wks. Pt is weak and daughter is requesting xanax. Pt last seen cory about a month ago

## 2018-12-20 NOTE — Telephone Encounter (Signed)
Spoke with patients daughter on the phone. The patient's wife is in the nursing home and has about 2-3 weeks to live. Marcus [name] is having a hard time with this understandably. His daughter reports that he is not sleeping well and is quite anxious.   I advised his daughter that I will send in a low dose of xanax for him to take when needed. Side effects reviewed.

## 2018-12-20 NOTE — Telephone Encounter (Signed)
Copied from [place] [id number]. Topic: General - Other >> Dec 20, 2018  8:17 AM Rayann Heman wrote: Reason for CRM: pt called and stated that he would like a call back from [facility]. Pt would not give any detail. Please advise

## 2018-12-20 NOTE — Progress Notes (Signed)
  Radiation Oncology         [phone] ________________________________  Name: VARICK KEYS MRN: [id number]  Date: 12/21/2018  DOB: 07-Dec-1940  SIMULATION AND TREATMENT PLANNING NOTE PUBIC ARCH STUDY  XT:[id number], Tommi Rumps, NP  Dorothyann Peng, NP  DIAGNOSIS: 78 y.o. gentleman with Stage T1c adenocarcinoma of the prostate with Gleason score of 3+4, and PSA of 9.03.     ICD-10-CM   1. Malignant neoplasm of prostate ([place])  C61     COMPLEX SIMULATION:  The patient presented today for evaluation for possible prostate seed implant. He was brought to the radiation planning suite and placed supine on the CT couch. A 3-dimensional image study set was obtained in upload to the planning computer. There, on each axial slice, I contoured the prostate gland. Then, using three-dimensional radiation planning tools I reconstructed the prostate in view of the structures from the transperineal needle pathway to assess for possible pubic arch interference. In doing so, I did not appreciate any pubic arch interference. Also, the patient's prostate volume was estimated based on the drawn structure. The volume was 44 cc.  His TRUS volume before downsizing with ARI and LHRH was 80.46 cc  Given the pubic arch appearance and prostate volume, patient remains a good candidate to proceed with prostate seed implant. Today, he freely provided informed written consent to proceed.    PLAN: The patient will undergo prostate seed implant.   ________________________________  Sheral Apley. Tammi Klippel, M.D.

## 2018-12-21 ENCOUNTER — Ambulatory Visit
Admission: RE | Admit: 2018-12-21 | Discharge: 2018-12-21 | Disposition: A | Discharge status: Home / Self Care | Payer: Medicare HMO | Source: Ambulatory Visit | Attending: Urology | Admitting: Urology

## 2018-12-21 ENCOUNTER — Other Ambulatory Visit: Payer: Self-pay | Admitting: Urology

## 2018-12-21 ENCOUNTER — Ambulatory Visit
Admission: RE | Admit: 2018-12-21 | Discharge: 2018-12-21 | Disposition: A | Discharge status: Home / Self Care | Payer: Medicare HMO | Source: Ambulatory Visit | Attending: Radiation Oncology | Admitting: Radiation Oncology

## 2018-12-21 ENCOUNTER — Other Ambulatory Visit: Payer: Self-pay

## 2018-12-21 DIAGNOSIS — C61 Malignant neoplasm of prostate: Secondary | ICD-10-CM | POA: Diagnosis not present

## 2018-12-25 ENCOUNTER — Telehealth: Payer: Self-pay | Admitting: *Deleted

## 2018-12-25 NOTE — Telephone Encounter (Signed)
RETURNED PATIENT'S PHONE CALL, SPOKE WITH PATIENT. ?

## 2018-12-26 ENCOUNTER — Telehealth: Payer: Self-pay | Admitting: *Deleted

## 2018-12-26 NOTE — Telephone Encounter (Signed)
Called patient to inform that his prostate volume has decreased from 82 cc to 44 cc, which makes him an excellent candidate for the implant, lvm for a return call

## 2019-01-10 ENCOUNTER — Other Ambulatory Visit: Payer: Self-pay

## 2019-01-10 ENCOUNTER — Encounter: Payer: Self-pay | Admitting: Primary Care

## 2019-01-10 ENCOUNTER — Ambulatory Visit (INDEPENDENT_AMBULATORY_CARE_PROVIDER_SITE_OTHER): Payer: Medicare HMO | Admitting: Primary Care

## 2019-01-10 DIAGNOSIS — G4733 Obstructive sleep apnea (adult) (pediatric): Secondary | ICD-10-CM | POA: Diagnosis not present

## 2019-01-10 DIAGNOSIS — Z9989 Dependence on other enabling machines and devices: Secondary | ICD-10-CM | POA: Diagnosis not present

## 2019-01-10 NOTE — Progress Notes (Signed)
Virtual Visit via Telephone Note  I connected with Marcus [name] on 01/10/19 at  9:30 AM EDT by telephone and verified that I am speaking with the correct person using two identifiers.  Location: Patient: Home Provider: Office   I discussed the limitations, risks, security and privacy concerns of performing an evaluation and management service by telephone and the availability of in person appointments. I also discussed with the patient that there may be a patient responsible charge related to this service. The patient expressed understanding and agreed to proceed.   History of Present Illness: 78 year old male, former smoker. PMH significant for Sleep apnea, GERD, hypertension. Patient of Dr. Elsworth Soho, seen for initial sleep consult in February 2020. Home sleep test showed severe OSA with AHI 53/hr. Started on CPAP 8cm H20. Pressure increased to 9cm in May.   01/10/2019 Patient called today for OSA follow-up. He is doing well, wearing CPAP. No issues with mask fit or pressure setting. Unable to get more than 4 hours of sleep a night d/t reflux. Also he has been staying with his wife some nights at an assisted facility.   [facility]view download 24/30 days; 47% > 4 hours Average use days used 4 hours 21 mins Pressure 9cm h20 AHI 3.8   Observations/Objective:  - No shortness of breath, wheezing or cough noted during phone conversation  Assessment and Plan:  OSA: - Patient has good compliance with CPAP and reports benefit from use - Encouraged him to use >4 hours each night - Pressure 9cm h20; AHI 3.8 - No changes  GERD: - Discussed GERD diet and recommendations to help with his nocturnal symptoms - If not improvement with above follow-up with GI/PCP  - Continue Omeprazole 40mg  daily   Follow Up Instructions:   - Follow up in 6 months with Dr. Elsworth Soho   I discussed the assessment and treatment plan with the patient. The patient was provided an opportunity to ask questions and all were  answered. The patient agreed with the plan and demonstrated an understanding of the instructions.   The patient was advised to call back or seek an in-person evaluation if the symptoms worsen or if the condition fails to improve as anticipated.  I provided 15 minutes of non-face-to-face time during this encounter.   Martyn Ehrich, NP

## 2019-01-10 NOTE — Patient Instructions (Addendum)
Great work wearing your cpap  Your home sleep test in February showed severe obstructive sleep apnea with 53 events/hr  Now with CPAP use you are averaging 3.8 events/hour   Work on wearing mask for 4-6 hours a night; do not drive if experiencing excessive daytime fatigue   Do not eat 2 hours before bedtime, try elevating head of bed to help with GERD  See below for GERD recommendations- if no improvement follow up with PCP or GI  Return in 6 months with Dr. Elsworth Soho    Food Choices for Gastroesophageal Reflux Disease, Adult When you have gastroesophageal reflux disease (GERD), the foods you eat and your eating habits are very important. Choosing the right foods can help ease your discomfort. Think about working with a nutrition specialist (dietitian) to help you make good choices. What are tips for following this plan?  Meals  Choose healthy foods that are low in fat, such as fruits, vegetables, whole grains, low-fat dairy products, and lean meat, fish, and poultry.  Eat small meals often instead of 3 large meals a day. Eat your meals slowly, and in a place where you are relaxed. Avoid bending over or lying down until 2-3 hours after eating.  Avoid eating meals 2-3 hours before bed.  Avoid drinking a lot of liquid with meals.  Cook foods using methods other than frying. Bake, grill, or broil food instead.  Avoid or limit: ? Chocolate. ? Peppermint or spearmint. ? Alcohol. ? Pepper. ? Black and decaffeinated coffee. ? Black and decaffeinated tea. ? Bubbly (carbonated) soft drinks. ? Caffeinated energy drinks and soft drinks.  Limit high-fat foods such as: ? Fatty meat or fried foods. ? Whole milk, cream, butter, or ice cream. ? Nuts and nut butters. ? Pastries, donuts, and sweets made with butter or shortening.  Avoid foods that cause symptoms. These foods may be different for everyone. Common foods that cause symptoms include: ? Tomatoes. ? Oranges, lemons, and limes. ?  Peppers. ? Spicy food. ? Onions and garlic. ? Vinegar. Lifestyle  Maintain a healthy weight. Ask your doctor what weight is healthy for you. If you need to lose weight, work with your doctor to do so safely.  Exercise for at least 30 minutes for 5 or more days each week, or as told by your doctor.  Wear loose-fitting clothes.  Do not smoke. If you need help quitting, ask your doctor.  Sleep with the head of your bed higher than your feet. Use a wedge under the mattress or blocks under the bed frame to raise the head of the bed. Summary  When you have gastroesophageal reflux disease (GERD), food and lifestyle choices are very important in easing your symptoms.  Eat small meals often instead of 3 large meals a day. Eat your meals slowly, and in a place where you are relaxed.  Limit high-fat foods such as fatty meat or fried foods.  Avoid bending over or lying down until 2-3 hours after eating.  Avoid peppermint and spearmint, caffeine, alcohol, and chocolate. This information is not intended to replace advice given to you by your health care provider. Make sure you discuss any questions you have with your health care provider. Document Released: 12/21/2011 Document Revised: 10/12/2018 Document Reviewed: 07/27/2016 Elsevier Patient Education  [place].   Gastroesophageal Reflux Disease, Adult Gastroesophageal reflux (GER) happens when acid from the stomach flows up into the tube that connects the mouth and the stomach (esophagus). Normally, food travels down the esophagus  and stays in the stomach to be digested. With GER, food and stomach acid sometimes move back up into the esophagus. You may have a disease called gastroesophageal reflux disease (GERD) if the reflux:  Happens often.  Causes frequent or very bad symptoms.  Causes problems such as damage to the esophagus. When this happens, the esophagus becomes sore and swollen (inflamed). Over time, GERD can make small  holes (ulcers) in the lining of the esophagus. What are the causes? This condition is caused by a problem with the muscle between the esophagus and the stomach. When this muscle is weak or not normal, it does not close properly to keep food and acid from coming back up from the stomach. The muscle can be weak because of:  Tobacco use.  Pregnancy.  Having a certain type of hernia (hiatal hernia).  Alcohol use.  Certain foods and drinks, such as coffee, chocolate, onions, and peppermint. What increases the risk? You are more likely to develop this condition if you:  Are overweight.  Have a disease that affects your connective tissue.  Use NSAID medicines. What are the signs or symptoms? Symptoms of this condition include:  Heartburn.  Difficult or painful swallowing.  The feeling of having a lump in the throat.  A bitter taste in the mouth.  Bad breath.  Having a lot of saliva.  Having an upset or bloated stomach.  Belching.  Chest pain. Different conditions can cause chest pain. Make sure you see your doctor if you have chest pain.  Shortness of breath or noisy breathing (wheezing).  Ongoing (chronic) cough or a cough at night.  Wearing away of the surface of teeth (tooth enamel).  Weight loss. How is this treated? Treatment will depend on how bad your symptoms are. Your doctor may suggest:  Changes to your diet.  Medicine.  Surgery. Follow these instructions at home: Eating and drinking   Follow a diet as told by your doctor. You may need to avoid foods and drinks such as: ? Coffee and tea (with or without caffeine). ? Drinks that contain alcohol. ? Energy drinks and sports drinks. ? Bubbly (carbonated) drinks or sodas. ? Chocolate and cocoa. ? Peppermint and mint flavorings. ? Garlic and onions. ? Horseradish. ? Spicy and acidic foods. These include peppers, chili powder, curry powder, vinegar, hot sauces, and BBQ sauce. ? Citrus fruit juices  and citrus fruits, such as oranges, lemons, and limes. ? Tomato-based foods. These include red sauce, chili, salsa, and pizza with red sauce. ? Fried and fatty foods. These include donuts, french fries, potato chips, and high-fat dressings. ? High-fat meats. These include hot dogs, rib eye steak, sausage, ham, and bacon. ? High-fat dairy items, such as whole milk, butter, and cream cheese.  Eat small meals often. Avoid eating large meals.  Avoid drinking large amounts of liquid with your meals.  Avoid eating meals during the 2-3 hours before bedtime.  Avoid lying down right after you eat.  Do not exercise right after you eat. Lifestyle   Do not use any products that contain nicotine or tobacco. These include cigarettes, e-cigarettes, and chewing tobacco. If you need help quitting, ask your doctor.  Try to lower your stress. If you need help doing this, ask your doctor.  If you are overweight, lose an amount of weight that is healthy for you. Ask your doctor about a safe weight loss goal. General instructions  Pay attention to any changes in your symptoms.  Take over-the-counter and  prescription medicines only as told by your doctor. Do not take aspirin, ibuprofen, or other NSAIDs unless your doctor says it is okay.  Wear loose clothes. Do not wear anything tight around your waist.  Raise (elevate) the head of your bed about 6 inches (15 cm).  Avoid bending over if this makes your symptoms worse.  Keep all follow-up visits as told by your doctor. This is important. Contact a doctor if:  You have new symptoms.  You lose weight and you do not know why.  You have trouble swallowing or it hurts to swallow.  You have wheezing or a cough that keeps happening.  Your symptoms do not get better with treatment.  You have a hoarse voice. Get help right away if:  You have pain in your arms, neck, jaw, teeth, or back.  You feel sweaty, dizzy, or light-headed.  You have chest  pain or shortness of breath.  You throw up (vomit) and your throw-up looks like blood or coffee grounds.  You pass out (faint).  Your poop (stool) is bloody or black.  You cannot swallow, drink, or eat. Summary  If a person has gastroesophageal reflux disease (GERD), food and stomach acid move back up into the esophagus and cause symptoms or problems such as damage to the esophagus.  Treatment will depend on how bad your symptoms are.  Follow a diet as told by your doctor.  Take all medicines only as told by your doctor. This information is not intended to replace advice given to you by your health care provider. Make sure you discuss any questions you have with your health care provider. Document Released: 12/08/2007 Document Revised: 12/28/2017 Document Reviewed: 12/28/2017 Elsevier Patient Education  [place].    CPAP and BPAP Information CPAP and BPAP are methods of helping a person breathe with the use of air pressure. CPAP stands for "continuous positive airway pressure." BPAP stands for "bi-level positive airway pressure." In both methods, air is blown through your nose or mouth and into your air passages to help you breathe well. CPAP and BPAP use different amounts of pressure to blow air. With CPAP, the amount of pressure stays the same while you breathe in and out. With BPAP, the amount of pressure is increased when you breathe in (inhale) so that you can take larger breaths. Your health care provider will recommend whether CPAP or BPAP would be more helpful for you. Why are CPAP and BPAP treatments used? CPAP or BPAP can be helpful if you have:  Sleep apnea.  Chronic obstructive pulmonary disease (COPD).  Heart failure.  Medical conditions that weaken the muscles of the chest including muscular dystrophy, or neurological diseases such as amyotrophic lateral sclerosis (ALS).  Other problems that cause breathing to be weak, abnormal, or difficult. CPAP is  most commonly used for obstructive sleep apnea (OSA) to keep the airways from collapsing when the muscles relax during sleep. How is CPAP or BPAP administered? Both CPAP and BPAP are provided by a small machine with a flexible plastic tube that attaches to a plastic mask. You wear the mask. Air is blown through the mask into your nose or mouth. The amount of pressure that is used to blow the air can be adjusted on the machine. Your health care provider will determine the pressure setting that should be used based on your individual needs. When should CPAP or BPAP be used? In most cases, the mask only needs to be worn during sleep. Generally,  the mask needs to be worn throughout the night and during any daytime naps. People with certain medical conditions may also need to wear the mask at other times when they are awake. Follow instructions from your health care provider about when to use the machine. What are some tips for using the mask?   Because the mask needs to be snug, some people feel trapped or closed-in (claustrophobic) when first using the mask. If you feel this way, you may need to get used to the mask. One way to do this is by holding the mask loosely over your nose or mouth and then gradually applying the mask more snugly. You can also gradually increase the amount of time that you use the mask.  Masks are available in various types and sizes. Some fit over your mouth and nose while others fit over just your nose. If your mask does not fit well, talk with your health care provider about getting a different one.  If you are using a mask that fits over your nose and you tend to breathe through your mouth, a chin strap may be applied to help keep your mouth closed.  The CPAP and BPAP machines have alarms that may sound if the mask comes off or develops a leak.  If you have trouble with the mask, it is very important that you talk with your health care provider about finding a way to make  the mask easier to tolerate. Do not stop using the mask. Stopping the use of the mask could have a negative impact on your health. What are some tips for using the machine?  Place your CPAP or BPAP machine on a secure table or stand near an electrical outlet.  Know where the on/off switch is located on the machine.  Follow instructions from your health care provider about how to set the pressure on your machine and when you should use it.  Do not eat or drink while the CPAP or BPAP machine is on. Food or fluids could get pushed into your lungs by the pressure of the CPAP or BPAP.  Do not smoke. Tobacco smoke residue can damage the machine.  For home use, CPAP and BPAP machines can be rented or purchased through home health care companies. Many different brands of machines are available. Renting a machine before purchasing may help you find out which particular machine works well for you.  Keep the CPAP or BPAP machine and attachments clean. Ask your health care provider for specific instructions. Get help right away if:  You have redness or open areas around your nose or mouth where the mask fits.  You have trouble using the CPAP or BPAP machine.  You cannot tolerate wearing the CPAP or BPAP mask.  You have pain, discomfort, and bloating in your abdomen. Summary  CPAP and BPAP are methods of helping a person breathe with the use of air pressure.  Both CPAP and BPAP are provided by a small machine with a flexible plastic tube that attaches to a plastic mask.  If you have trouble with the mask, it is very important that you talk with your health care provider about finding a way to make the mask easier to tolerate. This information is not intended to replace advice given to you by your health care provider. Make sure you discuss any questions you have with your health care provider. Document Released: 03/19/2004 Document Revised: 10/11/2018 Document Reviewed: 05/10/2016 Elsevier  Patient Education  2020 [employer]  

## 2019-01-11 ENCOUNTER — Ambulatory Visit (INDEPENDENT_AMBULATORY_CARE_PROVIDER_SITE_OTHER): Payer: Medicare HMO | Admitting: Adult Health

## 2019-01-11 ENCOUNTER — Other Ambulatory Visit: Payer: Self-pay

## 2019-01-11 ENCOUNTER — Encounter: Payer: Self-pay | Admitting: Adult Health

## 2019-01-11 VITALS — BP 130/62 | HR 63 | Temp 97.8°F | Wt 253.0 lb

## 2019-01-11 DIAGNOSIS — S50862A Insect bite (nonvenomous) of left forearm, initial encounter: Secondary | ICD-10-CM | POA: Diagnosis not present

## 2019-01-11 DIAGNOSIS — W57XXXA Bitten or stung by nonvenomous insect and other nonvenomous arthropods, initial encounter: Secondary | ICD-10-CM | POA: Diagnosis not present

## 2019-01-11 DIAGNOSIS — S80861A Insect bite (nonvenomous), right lower leg, initial encounter: Secondary | ICD-10-CM

## 2019-01-11 DIAGNOSIS — S80862A Insect bite (nonvenomous), left lower leg, initial encounter: Secondary | ICD-10-CM | POA: Diagnosis not present

## 2019-01-11 DIAGNOSIS — S50861A Insect bite (nonvenomous) of right forearm, initial encounter: Secondary | ICD-10-CM

## 2019-01-11 MED ORDER — TRIAMCINOLONE ACETONIDE 0.1 % EX CREA: 1.0000 "application " | TOPICAL_CREAM | Freq: Two times a day (BID) | CUTANEOUS | 0 refills | Status: DC

## 2019-01-11 NOTE — Progress Notes (Signed)
Subjective:    Patient ID: Marcus [name], male    DOB: 1940-09-26, 78 y.o.   MRN: [id number]  HPI 78 year old male who  has a past medical history of Acute meniscal tear of knee (left), Arthritis, Calcaneal spur, Colon cancer ([place]) (1983), Coronary artery disease, Diverticulosis of colon (without mention of hemorrhage), Echocardiogram findings abnormal, without diagnosis (04-23-2010), Edema of lower extremity, Elevated prostate specific antigen (PSA), Esophageal reflux, External hemorrhoids, Frequency of urination, Gout, Gout, unspecified, Hiatal hernia, History of colon cancer (1988), Hyperlipidemia, Lumbar spondylolysis, Normal nuclear stress test (04-23-2010), OSA (obstructive sleep apnea), Paraesophageal hernia, Personal history of colonic polyps (08/21/2009), Pneumonia, Postgastric surgery syndromes, Prostate cancer ([place]) (2016), Unspecified essential hypertension, Unspecified vitamin D deficiency, Ventral hernia, and Vitamin B12 deficiency.  Presents to the clinic today for an acute issue of " I think I am allergic to my tomato plants." He was working outside in his garden three days ago and was wearing shorts. Reports that he not has areas on the back of his legs and arms that are itching. Itching is keeping his awake at night    Review of Systems See HPI   Past Medical History:  Diagnosis Date  . Acute meniscal tear of knee left  . Arthritis    generalized  . Calcaneal spur   . Colon cancer [facility]) 1983   surgical removed. did not receive radiation or chemotherapy.  . Coronary artery disease    dr Stanford Breed  . Diverticulosis of colon (without mention of hemorrhage)   . Echocardiogram findings abnormal, without diagnosis 04-23-2010   normal LV function, mod. left atrial enlargement, mild right artrial enlargement and grade 1 diastolic dysfunction  . Edema of lower extremity    ankles, elevates legs  . Elevated prostate specific antigen (PSA)   . Esophageal reflux   . External  hemorrhoids   . Frequency of urination    followed by dr dalhstedt  . Gout   . Gout, unspecified   . Hiatal hernia   . History of colon cancer 1988  . Hyperlipidemia   . Lumbar spondylolysis   . Normal nuclear stress test 04-23-2010   ef 51%,  septal hypokinesis, normal perfusion  . OSA (obstructive sleep apnea)    does not use CPAP   . Paraesophageal hernia    large  . Personal history of colonic polyps 08/21/2009   TUBULAR ADENOMAS  . Pneumonia    history of; last episode 2015  . Postgastric surgery syndromes   . Prostate cancer ([place]) 2016  . Unspecified essential hypertension   . Unspecified vitamin D deficiency   . Ventral hernia   . Vitamin B12 deficiency     Social History   Socioeconomic History  . Marital status: Married    Spouse name: Not on file  . Number of children: 4  . Years of education: Not on file  . Highest education level: Not on file  Occupational History  . Occupation: retired    Fish farm manager: [employer]  . Financial resource strain: Not on file  . Food insecurity    Worry: Not on file    Inability: Not on file  . Transportation needs    Medical: Not on file    Non-medical: Not on file  Tobacco Use  . Smoking status: Former Smoker    Packs/day: 2.00    Years: 20.00    Pack years: 40.00    Types: Cigarettes    Quit date: 07/05/1974  Years since quitting: 44.5  . Smokeless tobacco: Never Used  Substance and Sexual Activity  . Alcohol use: Yes    Alcohol/week: 0.0 standard drinks    Comment: socially; also 1-2 glasses of wine or beer every other day   . Drug use: No  . Sexual activity: Not Currently  Lifestyle  . Physical activity    Days per week: Not on file    Minutes per session: Not on file  . Stress: Not on file  Relationships  . Social Herbalist on phone: Not on file    Gets together: Not on file    Attends religious service: Not on file    Active member of club or organization: Not on file     Attends meetings of clubs or organizations: Not on file    Relationship status: Not on file  . Intimate partner violence    Fear of current or ex partner: Not on file    Emotionally abused: Not on file    Physically abused: Not on file    Forced sexual activity: Not on file  Other Topics Concern  . Not on file  Social History Narrative   Married   Former Smoker    Alcohol use-yes 1-2 drinks per day      Occupation: Retired Midwife      Originally from [place], [place] - in [place] > 10 yrs as of 2017       Past Surgical History:  Procedure Laterality Date  . CARDIAC CATHETERIZATION    . CATARACT EXTRACTION W/ INTRAOCULAR LENS  IMPLANT, BILATERAL Bilateral   . COLONOSCOPY    . ESOPHAGOGASTRODUODENOSCOPY    . KNEE ARTHROSCOPY Right 2007  . KNEE ARTHROSCOPY  07/13/2011   Procedure: ARTHROSCOPY KNEE;  Surgeon: Cynda Familia;  Location: [place];  Service: Orthopedics;  Laterality: Left;  partial menisectomy with chondrylplasty  . KNEE SURGERY Left   . LAMINECTOMY AND MICRODISCECTOMY LUMBAR SPINE  MARCH  2008   L3 -  4  . LAPAROSCOPIC INCISIONAL / UMBILICAL / VENTRAL HERNIA REPAIR  2006  . PACEMAKER IMPLANT N/A 08/02/2018   Procedure: PACEMAKER IMPLANT;  Surgeon: Evans Lance, MD;  Location: [facility] [facility];  Service: Cardiovascular;  Laterality: N/A;  . PROSTATE BIOPSY    . SHOULDER ARTHROSCOPY Right 05-23-2007  . SIGMOID COLECTOMY FOR CANCER  1989  . TOTAL KNEE ARTHROPLASTY Left 05/23/2015   Procedure: LEFT TOTAL KNEE ARTHROPLASTY;  Surgeon: Sydnee Cabal, MD;  Location: [facility];  Service: Orthopedics;  Laterality: Left;  [name] Kitchen VERTICAL BANDED GASTROPLASTY  1986    Family History  Problem Relation Age of Onset  . Stroke Paternal Grandfather   . Heart disease Paternal Grandfather   . Esophagitis Father        died from perforated esophagus  . Breast cancer Mother   . Colon cancer Maternal Grandmother        questionable    Allergies  Allergen  Reactions  . Contrast Media [Iodinated Diagnostic Agents] Palpitations    TACHYCARDIA- patient states he has tolerated newer agents since this reaction >30 yrs ago    Current Outpatient Medications on File Prior to Visit  Medication Sig Dispense Refill  . allopurinol (ZYLOPRIM) 300 MG tablet TAKE 1 TABLET EVERY DAY 90 tablet 0  . ALPRAZolam (XANAX) 0.25 MG tablet Take 1 tablet (0.25 mg total) by mouth 2 (two) times daily as needed for up to 30 days for anxiety. 60 tablet  0  . amLODipine (NORVASC) 10 MG tablet TAKE 1 TABLET EVERY DAY 90 tablet 0  . azelastine (ASTELIN) 0.1 % nasal spray Place 2 sprays into both nostrils 2 (two) times daily. Use in each nostril as directed 30 mL 3  . Cholecalciferol (VITAMIN D) 2000 UNITS CAPS Take 1 capsule by mouth daily.    . Cyanocobalamin (VITAMIN B-12) 5000 MCG TBDP Take 1 tablet by mouth daily.     . cyclobenzaprine (FLEXERIL) 10 MG tablet Take 1 tablet (10 mg total) by mouth at bedtime as needed for up to 15 doses for muscle spasms. 15 tablet 0  . Docusate Calcium (STOOL SOFTENER PO) Take 1 tablet by mouth daily.    . fluticasone (FLONASE) 50 MCG/ACT nasal spray Place 2 sprays into both nostrils daily. 16 g 6  . magnesium oxide (MAG-OX) 400 MG tablet Take 400 mg by mouth daily.    . metoCLOPramide (REGLAN) 10 MG tablet qhs prn 90 tablet 0  . Multiple Vitamins-Minerals (MULTIVITAMIN WITH MINERALS) tablet Take 1 tablet by mouth daily.    [name] Kitchen omeprazole (PRILOSEC) 40 MG capsule Take 1 capsule (40 mg total) by mouth daily. 90 capsule 1  . Potassium 99 MG TABS Take by mouth daily.    . simvastatin (ZOCOR) 20 MG tablet Take 1 tablet (20 mg total) by mouth at bedtime. 90 tablet 1  . tamsulosin (FLOMAX) 0.4 MG CAPS capsule Take 0.4 mg by mouth daily.     . valsartan-hydrochlorothiazide (DIOVAN-HCT) 320-25 MG tablet TAKE 1 TABLET EVERY DAY 90 tablet 0   No current facility-administered medications on file prior to visit.     BP 130/62 (BP Location: Left Arm,  Patient Position: Sitting, Cuff Size: Normal)   Pulse 63   Temp 97.8 F (36.6 C) (Oral)   Wt 253 lb (114.8 kg)   SpO2 92%   BMI 37.36 kg/m       Objective:   Physical Exam Vitals signs and nursing note reviewed.  Constitutional:      Appearance: Normal appearance.  Musculoskeletal: Normal range of motion.  Skin:    General: Skin is warm and dry.     Findings: Erythema present.     Comments: Multiple small bug bites noted on back of legs and on arms. Appear as mosquito bites .Localed Erythema noted.   Neurological:     Mental Status: He is alert.  Psychiatric:        Judgment: Judgment normal.       Assessment & Plan:  1. Bug bite, initial encounter - triamcinolone cream (KENALOG) 0.1 %; Apply 1 application topically 2 (two) times daily.  Dispense: 30 g; Refill: 0  Dorothyann Peng, NP

## 2019-01-15 ENCOUNTER — Telehealth: Payer: Self-pay | Admitting: *Deleted

## 2019-01-15 NOTE — Telephone Encounter (Signed)
CALLED PATIENT TO INFORM THAT IMPLANT HAS BEEN MOVED TO 02-12-19 @ 7:30 AM @ [facility] MAIN OR, SPOKE WITH PATIENT AND HE IS AWARE OF THIS CHANGE AND IS GOOD WITH IT

## 2019-01-15 NOTE — Telephone Encounter (Signed)
CALLED PATIENT TO GIVE INFO., LVM FOR A RETURN CALL 

## 2019-01-16 DIAGNOSIS — R69 Illness, unspecified: Secondary | ICD-10-CM | POA: Diagnosis not present

## 2019-01-22 DIAGNOSIS — C61 Malignant neoplasm of prostate: Secondary | ICD-10-CM | POA: Diagnosis not present

## 2019-01-26 ENCOUNTER — Other Ambulatory Visit: Payer: Self-pay | Admitting: Adult Health

## 2019-01-26 DIAGNOSIS — I10 Essential (primary) hypertension: Secondary | ICD-10-CM

## 2019-02-05 ENCOUNTER — Telehealth: Payer: Self-pay | Admitting: *Deleted

## 2019-02-05 NOTE — Telephone Encounter (Signed)
CALLED PATIENT TO REMIND OF LAB APPT. FOR 02-07-19, LVM FOR A RETURN CALL

## 2019-02-07 ENCOUNTER — Other Ambulatory Visit: Payer: Self-pay

## 2019-02-07 ENCOUNTER — Encounter (HOSPITAL_COMMUNITY): Payer: Self-pay

## 2019-02-07 ENCOUNTER — Encounter (HOSPITAL_COMMUNITY)
Admission: RE | Admit: 2019-02-07 | Discharge: 2019-02-07 | Disposition: A | Discharge status: Home / Self Care | Payer: Medicare HMO | Source: Ambulatory Visit | Attending: Urology | Admitting: Urology

## 2019-02-07 DIAGNOSIS — I2510 Atherosclerotic heart disease of native coronary artery without angina pectoris: Secondary | ICD-10-CM | POA: Diagnosis not present

## 2019-02-07 DIAGNOSIS — E785 Hyperlipidemia, unspecified: Secondary | ICD-10-CM | POA: Diagnosis not present

## 2019-02-07 DIAGNOSIS — Z9884 Bariatric surgery status: Secondary | ICD-10-CM | POA: Insufficient documentation

## 2019-02-07 DIAGNOSIS — Z950 Presence of cardiac pacemaker: Secondary | ICD-10-CM | POA: Insufficient documentation

## 2019-02-07 DIAGNOSIS — K219 Gastro-esophageal reflux disease without esophagitis: Secondary | ICD-10-CM | POA: Diagnosis not present

## 2019-02-07 DIAGNOSIS — Z85038 Personal history of other malignant neoplasm of large intestine: Secondary | ICD-10-CM | POA: Diagnosis not present

## 2019-02-07 DIAGNOSIS — Z79899 Other long term (current) drug therapy: Secondary | ICD-10-CM | POA: Diagnosis not present

## 2019-02-07 DIAGNOSIS — C61 Malignant neoplasm of prostate: Secondary | ICD-10-CM | POA: Diagnosis not present

## 2019-02-07 DIAGNOSIS — E559 Vitamin D deficiency, unspecified: Secondary | ICD-10-CM | POA: Insufficient documentation

## 2019-02-07 DIAGNOSIS — Z20828 Contact with and (suspected) exposure to other viral communicable diseases: Secondary | ICD-10-CM | POA: Insufficient documentation

## 2019-02-07 DIAGNOSIS — Z96652 Presence of left artificial knee joint: Secondary | ICD-10-CM | POA: Insufficient documentation

## 2019-02-07 DIAGNOSIS — E538 Deficiency of other specified B group vitamins: Secondary | ICD-10-CM | POA: Diagnosis not present

## 2019-02-07 DIAGNOSIS — I10 Essential (primary) hypertension: Secondary | ICD-10-CM | POA: Diagnosis not present

## 2019-02-07 DIAGNOSIS — Z01818 Encounter for other preprocedural examination: Secondary | ICD-10-CM | POA: Diagnosis not present

## 2019-02-07 DIAGNOSIS — G4733 Obstructive sleep apnea (adult) (pediatric): Secondary | ICD-10-CM | POA: Diagnosis not present

## 2019-02-07 HISTORY — DX: Presence of cardiac pacemaker: Z95.0

## 2019-02-07 HISTORY — DX: Cramp and spasm: R25.2

## 2019-02-07 HISTORY — DX: Depression, unspecified: F32.A

## 2019-02-07 LAB — COMPREHENSIVE METABOLIC PANEL
ALT: 16 U/L (ref 0–44)
AST: 37 U/L (ref 15–41)
Albumin: 3.7 g/dL (ref 3.5–5.0)
Alkaline Phosphatase: 55 U/L (ref 38–126)
Anion gap: 5 (ref 5–15)
BUN: 26 mg/dL — ABNORMAL HIGH (ref 8–23)
CO2: 28 mmol/L (ref 22–32)
Calcium: 9.4 mg/dL (ref 8.9–10.3)
Chloride: 107 mmol/L (ref 98–111)
Creatinine, Ser: 1.07 mg/dL (ref 0.61–1.24)
GFR calc Af Amer: >60 mL/min (ref >60)
GFR calc non Af Amer: >60 mL/min (ref >60)
Glucose, Bld: 104 mg/dL — ABNORMAL HIGH (ref 70–99)
Potassium: 4.8 mmol/L (ref 3.5–5.1)
Sodium: 140 mmol/L (ref 135–145)
Total Bilirubin: 1.1 mg/dL (ref 0.3–1.2)
Total Protein: 7.2 g/dL (ref 6.5–8.1)

## 2019-02-07 LAB — PROTIME-INR
INR: 1 (ref 0.8–1.2)
Prothrombin Time: 13 seconds (ref 11.4–15.2)

## 2019-02-07 LAB — CBC
HCT: 37.3 % — ABNORMAL LOW (ref 39.0–52.0)
Hemoglobin: 11.8 g/dL — ABNORMAL LOW (ref 13.0–17.0)
MCH: 29.4 pg (ref 26.0–34.0)
MCHC: 31.6 g/dL (ref 30.0–36.0)
MCV: 92.8 fL (ref 80.0–100.0)
Platelets: 230 10*3/uL (ref 150–400)
RBC: 4.02 MIL/uL — ABNORMAL LOW (ref 4.22–5.81)
RDW: 14.9 % (ref 11.5–15.5)
WBC: 6.2 10*3/uL (ref 4.0–10.5)
nRBC: 0 % (ref 0.0–0.2)

## 2019-02-07 LAB — APTT: aPTT: 29 seconds (ref 24–36)

## 2019-02-07 MED ORDER — FLEET ENEMA 7-19 GM/118ML RE ENEM
1.0000 | ENEMA | Freq: Once | RECTAL | Status: DC
  Filled 2019-02-07: qty 1

## 2019-02-07 NOTE — Patient Instructions (Addendum)
DUE TO COVID-19 ONLY ONE VISITOR IS ALLOWED TO VISIT DURING VISITOR HOURS ONLY!!   COVID SWAB TESTING MUST BE COMPLETED ON: Thursday, Aug. 6, 2020 at 2:05PM.  [address] [place] [facility] enter pre surgical testing line (Must self quarantine after testing. Follow instructions on handout.)             Your procedure is scheduled on: Monday, Aug. 10, 2020    Report to [facility] Main  Entrance   Report to Short Stay at 5 :30 AM    Call this number if you have problems the morning of surgery [phone]   Do not eat food or drink liquids :After Midnight.   Use a fleet enema the morning of surgery   Bring CPAP mask and tubing day of surgery   Brush your teeth the morning of surgery.   Do NOT smoke after Midnight   Take these medicines the morning of surgery with A SIP OF WATER: Amlodipine, Tamsulosin, Omeprazole, Allopurinol, Certrizine                               You may not have any metal on your body including jewelry, and body piercings             Do not wear lotions, powders, perfumes/cologne, or deodorant                           Men may shave face and neck.   Do not bring valuables to the hospital. [place].   Contacts, dentures or bridgework may not be worn into surgery.   Bring small overnight bag day of surgery.    Patients discharged the day of surgery will not be allowed to drive home.   Name and phone number of your driver:   Special Instructions: Bring a copy of your healthcare power of attorney and living will documents         the day of surgery if you haven't scanned them in before.              Please read over the following fact sheets you were given:  [facility] - Preparing for Surgery Before surgery, you can play an important role.  Because skin is not sterile, your skin needs to be as free of germs as possible.  You can reduce the number of germs on your  skin by washing with CHG (chlorahexidine gluconate) soap before surgery.  CHG is an antiseptic cleaner which kills germs and bonds with the skin to continue killing germs even after washing. Please DO NOT use if you have an allergy to CHG or antibacterial soaps.  If your skin becomes reddened/irritated stop using the CHG and inform your nurse when you arrive at Short Stay. Do not shave (including legs and underarms) for at least 48 hours prior to the first CHG shower.  You may shave your face/neck.  Please follow these instructions carefully:  1.  Shower with CHG Soap the night before surgery and the  morning of surgery.  2.  If you choose to wash your hair, wash your hair first as usual with your normal  shampoo.  3.  After you shampoo, rinse your hair and body thoroughly to remove the shampoo.  4.  Use CHG as you would any other liquid soap.  You can apply chg directly to the skin and wash.  Gently with a scrungie or clean washcloth.  5.  Apply the CHG Soap to your body ONLY FROM THE NECK DOWN.   Do   not use on face/ open                           Wound or open sores. Avoid contact with eyes, ears mouth and   genitals (private parts).                       Wash face,  Genitals (private parts) with your normal soap.             6.  Wash thoroughly, paying special attention to the area where your    surgery  will be performed.  7.  Thoroughly rinse your body with warm water from the neck down.  8.  DO NOT shower/wash with your normal soap after using and rinsing off the CHG Soap.                9.  Pat yourself dry with a clean towel.            10.  Wear clean pajamas.            11.  Place clean sheets on your bed the night of your first shower and do not  sleep with pets. Day of Surgery : Do not apply any lotions/deodorants the morning of surgery.  Please wear clean clothes to the hospital/surgery center.  FAILURE TO FOLLOW THESE INSTRUCTIONS MAY RESULT IN THE  CANCELLATION OF YOUR SURGERY  PATIENT SIGNATURE_________________________________  NURSE SIGNATURE__________________________________  ________________________________________________________________________

## 2019-02-07 NOTE — Progress Notes (Signed)
SPOKE W/  Marcus [name]     SCREENING SYMPTOMS OF COVID 19:   COUGH--No  RUNNY NOSE--- No  SORE THROAT---No  NASAL CONGESTION----No  SNEEZING----No  SHORTNESS OF BREATH---No  DIFFICULTY BREATHING---No  TEMP >100.0 -----No  UNEXPLAINED BODY ACHES------No  CHILLS -------- No  HEADACHES ---------No  LOSS OF SMELL/ TASTE --------No    HAVE YOU OR ANY FAMILY MEMBER TRAVELLED PAST 14 DAYS OUT OF THE   COUNTY---No STATE----No COUNTRY----No  HAVE YOU OR ANY FAMILY MEMBER BEEN EXPOSED TO ANYONE WITH COVID 19? No

## 2019-02-07 NOTE — Progress Notes (Signed)
EKG 08-03-18 Epic LAST DEVICE CHECK CHECK NOTE 11-18-18 Epic CHEST XRAY 1 VIEW 12-06-18 Epic ECHO 07-24-18 EPIC

## 2019-02-08 ENCOUNTER — Other Ambulatory Visit (HOSPITAL_COMMUNITY)
Admission: RE | Admit: 2019-02-08 | Discharge: 2019-02-08 | Disposition: A | Discharge status: Home / Self Care | Payer: Medicare HMO | Source: Ambulatory Visit | Attending: Urology | Admitting: Urology

## 2019-02-08 ENCOUNTER — Telehealth (INDEPENDENT_AMBULATORY_CARE_PROVIDER_SITE_OTHER): Payer: Medicare HMO | Admitting: Cardiology

## 2019-02-08 ENCOUNTER — Telehealth: Payer: Self-pay | Admitting: *Deleted

## 2019-02-08 VITALS — BP 142/65 | Temp 98.2°F | Ht 68.0 in

## 2019-02-08 DIAGNOSIS — Z01818 Encounter for other preprocedural examination: Secondary | ICD-10-CM | POA: Diagnosis not present

## 2019-02-08 DIAGNOSIS — E7800 Pure hypercholesterolemia, unspecified: Secondary | ICD-10-CM

## 2019-02-08 DIAGNOSIS — Z79899 Other long term (current) drug therapy: Secondary | ICD-10-CM | POA: Diagnosis not present

## 2019-02-08 DIAGNOSIS — Z950 Presence of cardiac pacemaker: Secondary | ICD-10-CM | POA: Diagnosis not present

## 2019-02-08 DIAGNOSIS — I10 Essential (primary) hypertension: Secondary | ICD-10-CM | POA: Diagnosis not present

## 2019-02-08 DIAGNOSIS — Z20828 Contact with and (suspected) exposure to other viral communicable diseases: Secondary | ICD-10-CM | POA: Diagnosis not present

## 2019-02-08 DIAGNOSIS — I2510 Atherosclerotic heart disease of native coronary artery without angina pectoris: Secondary | ICD-10-CM | POA: Diagnosis not present

## 2019-02-08 DIAGNOSIS — R55 Syncope and collapse: Secondary | ICD-10-CM | POA: Diagnosis not present

## 2019-02-08 DIAGNOSIS — E785 Hyperlipidemia, unspecified: Secondary | ICD-10-CM | POA: Diagnosis not present

## 2019-02-08 DIAGNOSIS — K219 Gastro-esophageal reflux disease without esophagitis: Secondary | ICD-10-CM | POA: Diagnosis not present

## 2019-02-08 DIAGNOSIS — C61 Malignant neoplasm of prostate: Secondary | ICD-10-CM | POA: Diagnosis not present

## 2019-02-08 LAB — SARS CORONAVIRUS 2 (TAT 6-24 HRS): SARS Coronavirus 2: NEGATIVE

## 2019-02-08 NOTE — Patient Instructions (Signed)

## 2019-02-08 NOTE — Telephone Encounter (Signed)
CALLED PATIENT TO REMIND OF IMPLANT ON 02-12-19, LVM FOR A RETURN CALL

## 2019-02-08 NOTE — Progress Notes (Signed)
Virtual Visit via Video Note   This visit type was conducted due to national recommendations for restrictions regarding the COVID-19 Pandemic (e.g. social distancing) in an effort to limit this patient's exposure and mitigate transmission in our community.  Due to his co-morbid illnesses, this patient is at least at moderate risk for complications without adequate follow up.  This format is felt to be most appropriate for this patient at this time.  All issues noted in this document were discussed and addressed.  A limited physical exam was performed with this format.  Please refer to the patient's chart for his consent to telehealth for [facility].   Date:  02/08/2019   ID:  Marcus [name], DOB 06-Jan-1941, MRN [id number]  Patient Location:Home Provider Location: Home  PCP:  Dorothyann Peng, NP  Cardiologist:  Dr Stanford Breed  Evaluation Performed:  Follow-Up Visit  Chief Complaint:  FU syncope  History of Present Illness:    FU chest pain and syncope. Patient apparently with long history of atypical chest pain. Previous cardiac catheterization normal in [place] by his report. Myoview in Oct of 2011 showed an ejection fraction of 51%, septal hypokinesis but normal perfusion. Echocardiogram in October of 2011 showed normal LV function, moderate left atrial enlargement, mild right atrial enlargement and grade 1 diastolic dysfunction. Nuclear study in April of 2014 showed ejection fraction 59%. There was a small scar in the inferior wall but no ischemia.  Abdominal ultrasound January 2018 showed no aneurysm.  Had recent syncopal episode and cardiology asked to evaluate.    Echocardiogram January 2020 showed normal LV function and mild diastolic dysfunction.  Monitor January 2020 showed sinus rhythm with Mobitz 1, 2-1 AV block, complete heart block, junctional rhythm and 8 beats nonsustained ventricular tachycardia.  Had pacemaker implanted January 2020.  Since last seen,denies dyspnea, chest  pain, palpitations or syncope.  He recently lost his wife and has appropriate grief.  The patient does not have symptoms concerning for COVID-19 infection (fever, chills, cough, or new shortness of breath).    Past Medical History:  Diagnosis Date   Acute meniscal tear of knee left   Arthritis    generalized   Calcaneal spur    Colon cancer ([place]) 1983   surgical removed. did not receive radiation or chemotherapy.   Coronary artery disease    dr Stanford Breed   Cramps of lower extremity    in the mornings occ.   Depression    Diverticulosis of colon (without mention of hemorrhage)    Echocardiogram findings abnormal, without diagnosis 04-23-2010   normal LV function, mod. left atrial enlargement, mild right artrial enlargement and grade 1 diastolic dysfunction   Edema of lower extremity    ankles, elevates legs   Elevated prostate specific antigen (PSA)    Esophageal reflux    External hemorrhoids    Frequency of urination    followed by dr dalhstedt   Gout    Hiatal hernia    History of colon cancer 1988   Hyperlipidemia    Lumbar spondylolysis    Malaria 1964   Normal nuclear stress test 04-23-2010   ef 51%,  septal hypokinesis, normal perfusion   OSA (obstructive sleep apnea)    does not use CPAP    Paraesophageal hernia    large   Personal history of colonic polyps 08/21/2009   TUBULAR ADENOMAS   Pneumonia    history of; last episode 2015   Postgastric surgery syndromes    Presence of  permanent cardiac pacemaker    Prostate cancer ([place]) 2016   Unspecified essential hypertension    Unspecified vitamin D deficiency    Ventral hernia    Vitamin B12 deficiency    Past Surgical History:  Procedure Laterality Date   CARDIAC CATHETERIZATION     CATARACT EXTRACTION W/ INTRAOCULAR LENS  IMPLANT, BILATERAL Bilateral    COLONOSCOPY     ESOPHAGOGASTRODUODENOSCOPY     KNEE ARTHROSCOPY Right 2007   KNEE ARTHROSCOPY  07/13/2011    Procedure: ARTHROSCOPY KNEE;  Surgeon: Cynda Familia;  Location: [place];  Service: Orthopedics;  Laterality: Left;  partial menisectomy with chondrylplasty   KNEE SURGERY Left    LAMINECTOMY AND MICRODISCECTOMY LUMBAR SPINE  MARCH  2008   L3 -  4   LAPAROSCOPIC INCISIONAL / UMBILICAL / VENTRAL HERNIA REPAIR  2006   PACEMAKER IMPLANT N/A 08/02/2018   Procedure: PACEMAKER IMPLANT;  Surgeon: Evans Lance, MD;  Location: [place] [facility];  Service: Cardiovascular;  Laterality: N/A;   PROSTATE BIOPSY     SHOULDER ARTHROSCOPY Right 05-23-2007   SIGMOID COLECTOMY FOR CANCER  1989   TOTAL KNEE ARTHROPLASTY Left 05/23/2015   Procedure: LEFT TOTAL KNEE ARTHROPLASTY;  Surgeon: Sydnee Cabal, MD;  Location: [facility];  Service: Orthopedics;  Laterality: Left;   VERTICAL BANDED GASTROPLASTY  1986     Current Meds  Medication Sig   allopurinol (ZYLOPRIM) 300 MG tablet TAKE 1 TABLET EVERY DAY   amLODipine (NORVASC) 10 MG tablet TAKE 1 TABLET EVERY DAY   Calcium Carb-Cholecalciferol (CALCIUM 600 + D PO) Take 1 tablet by mouth daily.   cetirizine (ZYRTEC) 10 MG tablet Take 10 mg by mouth daily.   Cholecalciferol (VITAMIN D) 2000 UNITS CAPS Take 1 capsule by mouth daily.   Cyanocobalamin (VITAMIN B-12) 5000 MCG TBDP Take 1 tablet by mouth daily.    Docusate Calcium (STOOL SOFTENER PO) Take 1 tablet by mouth daily.   finasteride (PROSCAR) 5 MG tablet Take 5 mg by mouth daily.   magnesium oxide (MAG-OX) 400 MG tablet Take 400 mg by mouth daily.   Multiple Vitamins-Minerals (MULTIVITAMIN WITH MINERALS) tablet Take 1 tablet by mouth daily.   omeprazole (PRILOSEC) 40 MG capsule Take 1 capsule (40 mg total) by mouth daily.   oxymetazoline (AFRIN) 0.05 % nasal spray Place 1 spray into both nostrils at bedtime.   Potassium 99 MG TABS Take 99 mg by mouth daily.    simvastatin (ZOCOR) 20 MG tablet Take 1 tablet (20 mg total) by mouth at bedtime.   tamsulosin  (FLOMAX) 0.4 MG CAPS capsule Take 0.4 mg by mouth daily.    valsartan-hydrochlorothiazide (DIOVAN-HCT) 320-25 MG tablet TAKE 1 TABLET EVERY DAY     Allergies:   Contrast media [iodinated diagnostic agents]   Social History   Tobacco Use   Smoking status: Former Smoker    Packs/day: 2.00    Years: 20.00    Pack years: 40.00    Types: Cigarettes    Quit date: 07/05/1974    Years since quitting: 44.6   Smokeless tobacco: Never Used  Substance Use Topics   Alcohol use: Yes    Alcohol/week: 0.0 standard drinks    Comment: socially; also 1-2 glasses of wine or beer every other day    Drug use: No     Family Hx: The patient's family history includes Breast cancer in his mother; Colon cancer in his maternal grandmother; Esophagitis in his father; Heart disease in his paternal grandfather;  Stroke in his paternal grandfather.  ROS:   Please see the history of present illness.    No Fever, chills  or productive cough All other systems reviewed and are negative.   Recent Labs: 07/12/2018: TSH 0.69 02/07/2019: ALT 16; BUN 26; Creatinine, Ser 1.07; Hemoglobin 11.8; Platelets 230; Potassium 4.8; Sodium 140   Recent Lipid Panel Lab Results  Component Value Date/Time   CHOL 121 03/04/2017 08:08 AM   CHOL 126 01/01/2011 04:09 PM   TRIG 62.0 03/04/2017 08:08 AM   TRIG 90 01/01/2011 04:09 PM   HDL 44.30 03/04/2017 08:08 AM   HDL 43 01/01/2011 04:09 PM   CHOLHDL 3 03/04/2017 08:08 AM   LDLCALC 65 03/04/2017 08:08 AM   LDLCALC 65 01/01/2011 04:09 PM    Wt Readings from Last 3 Encounters:  02/07/19 253 lb 6 oz (114.9 kg)  01/11/19 253 lb (114.8 kg)  12/06/18 254 lb (115.2 kg)     Objective:    Vital Signs:  BP (!) 142/65    Temp 98.2 F (36.8 C)    Ht 5\' 8"  (1.727 m)    BMI 38.53 kg/m    VITAL SIGNS:  reviewed NAD Answers questions appropriately Normal affect Remainder of physical examination not performed (telehealth visit; coronavirus pandemic)  ASSESSMENT & PLAN:     1. Syncope-no recurrent episodes.  Previous evaluation as outlined above and patient is now status post pacemaker.  LV function normal. 2. Prior pacemaker-now followed by Dr. Lovena Le. 3. Hypertension-patient's blood pressure is controlled.  Continue present medications and follow. 4. Hyperlipidemia-continue statin.  COVID-19 Education: The importance of social distancing was discussed today.  Time:   Today, I have spent 17 minutes with the patient with telehealth technology discussing the above problems.     Medication Adjustments/Labs and Tests Ordered: Current medicines are reviewed at length with the patient today.  Concerns regarding medicines are outlined above.   Tests Ordered: No orders of the defined types were placed in this encounter.   Medication Changes: No orders of the defined types were placed in this encounter.   Follow Up:  Virtual Visit or In Person in 1 year(s)  Signed, Kirk Ruths, MD  02/08/2019 10:41 AM    [place]

## 2019-02-09 NOTE — Anesthesia Preprocedure Evaluation (Addendum)
Anesthesia Evaluation  Patient identified by MRN, date of birth, ID band Patient awake    Reviewed: Allergy & Precautions, NPO status , Patient's Chart, lab work & pertinent test results  History of Anesthesia Complications Negative for: history of anesthetic complications  Airway Mallampati: I  TM Distance: >3 FB Neck ROM: Full    Dental  (+) Dental Advisory Given, Teeth Intact   Pulmonary sleep apnea (partially compliant) and Continuous Positive Airway Pressure Ventilation , former smoker,    Pulmonary exam normal        Cardiovascular hypertension, Pt. on medications + CAD  Normal cardiovascular exam+ dysrhythmias + pacemaker    '20 TTE - EF 55% to 60%. Grade 1 diastolic dysfunction. The ascending aorta was mildly dilated.    Neuro/Psych  Headaches, PSYCHIATRIC DISORDERS Depression    GI/Hepatic Neg liver ROS, hiatal hernia, GERD  Medicated and Controlled, Colon cancer s/p surgical excision    Endo/Other   Obesity   Renal/GU negative Renal ROS     Musculoskeletal  (+) Arthritis ,  Gout    Abdominal   Peds  Hematology  (+) Blood dyscrasia, anemia ,   Anesthesia Other Findings   Reproductive/Obstetrics                           Anesthesia Physical Anesthesia Plan  ASA: III  Anesthesia Plan: General   Post-op Pain Management:    Induction: Intravenous  PONV Risk Score and Plan: 2 and Treatment may vary due to age or medical condition, Ondansetron and Dexamethasone  Airway Management Planned: LMA  Additional Equipment: None  Intra-op Plan:   Post-operative Plan: Extubation in OR  Informed Consent: I have reviewed the patients History and Physical, chart, labs and discussed the procedure including the risks, benefits and alternatives for the proposed anesthesia with the patient or authorized representative who has indicated his/her understanding and acceptance.      Dental advisory given  Plan Discussed with: CRNA and Anesthesiologist  Anesthesia Plan Comments:       Anesthesia Quick Evaluation

## 2019-02-09 NOTE — Progress Notes (Signed)
Anesthesia Chart Review   Case: [id number] Date/Time: 02/12/19 0715   Procedures:      RADIOACTIVE SEED IMPLANT/BRACHYTHERAPY IMPLANT (N/A ) - 90 MINS     SPACE OAR INSTILLATION (N/A )   Anesthesia type: Choice   Pre-op diagnosis: PROSTATE CANCER   Location: [place] [facility]   Surgeon: Franchot Gallo, MD      DISCUSSION:78 y.o. former smoker (40 pack years, quit 07/05/74) with h/o HLD, HTN, CAD, pacemaker implanted 07/2018, hiatal hernia, GERD, OSA w/o device, prostate cancer scheduled for above procedure 02/12/2019 with Dr. Franchot Gallo.    Pt last seen by cardiologist, Dr. Kirk Ruths, 02/08/2019.  Myoview in Oct of 2011 showed an ejection fraction of 51%, septal hypokinesis but normal perfusion. Stable at this visit.  1 year follow up recommended.   Anticipate pt can proceed with planned procedure barring acute status change.   VS: BP (!) (P) 143/65 (BP Location: Left Arm)   Pulse 62   Temp 36.8 C (Oral)   Resp 18   Ht 5\' 8"  (1.727 m)   Wt 114.9 kg   SpO2 97%   BMI 38.53 kg/m   PROVIDERS: Dorothyann Peng, NP is PCP last seen 01/11/2019  Kirk Ruths, MD is Cardiologist   Cristopher Peru, MD is Electrophysiologist  LABS: Labs reviewed: Acceptable for surgery. (all labs ordered are listed, but only abnormal results are displayed)  Labs Reviewed  CBC - Abnormal; Notable for the following components:      Result Value   RBC 4.02 (*)    Hemoglobin 11.8 (*)    HCT 37.3 (*)    All other components within normal limits  COMPREHENSIVE METABOLIC PANEL - Abnormal; Notable for the following components:   Glucose, Bld 104 (*)    BUN 26 (*)    All other components within normal limits  APTT  PROTIME-INR     IMAGES: Chest Xray 12/06/2018 FINDINGS: Stable cardiomediastinal silhouette. Left-sided pacemaker is unchanged in position. No pneumothorax is noted. Mild bibasilar subsegmental atelectasis is noted. Bony thorax is unremarkable.  IMPRESSION: Mild  bibasilar subsegmental atelectasis.  EKG: 12/07/2018 Rate 60 bpm Atrial-paced rhythm with prolonged AV conduction  Left axis deviation  Abnormal ECG Compared to previous tracing, atrial paced rhythm is new  CV: Echo 07/24/2018 Study Conclusions  - Left ventricle: The cavity size was normal. Wall thickness was   normal. Systolic function was normal. The estimated ejection   fraction was in the range of 55% to 60%. Wall motion was normal;   there were no regional wall motion abnormalities. Doppler   parameters are consistent with abnormal left ventricular   relaxation (grade 1 diastolic dysfunction). - Ascending aorta: The ascending aorta was mildly dilated. - Mitral valve: Calcified annulus. Past Medical History:  Diagnosis Date  . Acute meniscal tear of knee left  . Arthritis    generalized  . Calcaneal spur   . Colon cancer [facility]) 1983   surgical removed. did not receive radiation or chemotherapy.  . Coronary artery disease    dr Stanford Breed  . Cramps of lower extremity    in the mornings occ.  . Depression   . Diverticulosis of colon (without mention of hemorrhage)   . Echocardiogram findings abnormal, without diagnosis 04-23-2010   normal LV function, mod. left atrial enlargement, mild right artrial enlargement and grade 1 diastolic dysfunction  . Edema of lower extremity    ankles, elevates legs  . Elevated prostate specific antigen (PSA)   . Esophageal  reflux   . External hemorrhoids   . Frequency of urination    followed by dr dalhstedt  . Gout   . Hiatal hernia   . History of colon cancer 1988  . Hyperlipidemia   . Lumbar spondylolysis   . Malaria 1964  . Normal nuclear stress test 04-23-2010   ef 51%,  septal hypokinesis, normal perfusion  . OSA (obstructive sleep apnea)    does not use CPAP   . Paraesophageal hernia    large  . Personal history of colonic polyps 08/21/2009   TUBULAR ADENOMAS  . Pneumonia    history of; last episode 2015  . Postgastric  surgery syndromes   . Presence of permanent cardiac pacemaker   . Prostate cancer ([place]) 2016  . Unspecified essential hypertension   . Unspecified vitamin D deficiency   . Ventral hernia   . Vitamin B12 deficiency     Past Surgical History:  Procedure Laterality Date  . CARDIAC CATHETERIZATION    . CATARACT EXTRACTION W/ INTRAOCULAR LENS  IMPLANT, BILATERAL Bilateral   . COLONOSCOPY    . ESOPHAGOGASTRODUODENOSCOPY    . KNEE ARTHROSCOPY Right 2007  . KNEE ARTHROSCOPY  07/13/2011   Procedure: ARTHROSCOPY KNEE;  Surgeon: Cynda Familia;  Location: [place];  Service: Orthopedics;  Laterality: Left;  partial menisectomy with chondrylplasty  . KNEE SURGERY Left   . LAMINECTOMY AND MICRODISCECTOMY LUMBAR SPINE  MARCH  2008   L3 -  4  . LAPAROSCOPIC INCISIONAL / UMBILICAL / VENTRAL HERNIA REPAIR  2006  . PACEMAKER IMPLANT N/A 08/02/2018   Procedure: PACEMAKER IMPLANT;  Surgeon: Evans Lance, MD;  Location: [place] [facility];  Service: Cardiovascular;  Laterality: N/A;  . PROSTATE BIOPSY    . SHOULDER ARTHROSCOPY Right 05-23-2007  . SIGMOID COLECTOMY FOR CANCER  1989  . TOTAL KNEE ARTHROPLASTY Left 05/23/2015   Procedure: LEFT TOTAL KNEE ARTHROPLASTY;  Surgeon: Sydnee Cabal, MD;  Location: [facility];  Service: Orthopedics;  Laterality: Left;  [name] Kitchen VERTICAL BANDED GASTROPLASTY  1986    MEDICATIONS: . allopurinol (ZYLOPRIM) 300 MG tablet  . amLODipine (NORVASC) 10 MG tablet  . Calcium Carb-Cholecalciferol (CALCIUM 600 + D PO)  . cetirizine (ZYRTEC) 10 MG tablet  . Cholecalciferol (VITAMIN D) 2000 UNITS CAPS  . Cyanocobalamin (VITAMIN B-12) 5000 MCG TBDP  . Docusate Calcium (STOOL SOFTENER PO)  . finasteride (PROSCAR) 5 MG tablet  . magnesium oxide (MAG-OX) 400 MG tablet  . Multiple Vitamins-Minerals (MULTIVITAMIN WITH MINERALS) tablet  . omeprazole (PRILOSEC) 40 MG capsule  . oxymetazoline (AFRIN) 0.05 % nasal spray  . Potassium 99 MG TABS  . simvastatin  (ZOCOR) 20 MG tablet  . tamsulosin (FLOMAX) 0.4 MG CAPS capsule  . valsartan-hydrochlorothiazide (DIOVAN-HCT) 320-25 MG tablet   No current facility-administered medications for this encounter.      Maia Plan [facility] Pre-Surgical Testing [phone] 02/09/19  9:24 AM

## 2019-02-11 NOTE — H&P (Signed)
H&P  Chief Complaint: PCa  History of Present Illness: 78 yo male presents for I-125 brachytherapy for primary curative treatment of low/intermediate risk pCa.  Past Medical History:  Diagnosis Date  . Acute meniscal tear of knee left  . Arthritis    generalized  . Calcaneal spur   . Colon cancer [facility]) 1983   surgical removed. did not receive radiation or chemotherapy.  . Coronary artery disease    dr Stanford Breed  . Cramps of lower extremity    in the mornings occ.  . Depression   . Diverticulosis of colon (without mention of hemorrhage)   . Echocardiogram findings abnormal, without diagnosis 04-23-2010   normal LV function, mod. left atrial enlargement, mild right artrial enlargement and grade 1 diastolic dysfunction  . Edema of lower extremity    ankles, elevates legs  . Elevated prostate specific antigen (PSA)   . Esophageal reflux   . External hemorrhoids   . Frequency of urination    followed by dr dalhstedt  . Gout   . Hiatal hernia   . History of colon cancer 1988  . Hyperlipidemia   . Lumbar spondylolysis   . Malaria 1964  . Normal nuclear stress test 04-23-2010   ef 51%,  septal hypokinesis, normal perfusion  . OSA (obstructive sleep apnea)    does not use CPAP   . Paraesophageal hernia    large  . Personal history of colonic polyps 08/21/2009   TUBULAR ADENOMAS  . Pneumonia    history of; last episode 2015  . Postgastric surgery syndromes   . Presence of permanent cardiac pacemaker   . Prostate cancer ([place]) 2016  . Unspecified essential hypertension   . Unspecified vitamin D deficiency   . Ventral hernia   . Vitamin B12 deficiency     Past Surgical History:  Procedure Laterality Date  . CARDIAC CATHETERIZATION    . CATARACT EXTRACTION W/ INTRAOCULAR LENS  IMPLANT, BILATERAL Bilateral   . COLONOSCOPY    . ESOPHAGOGASTRODUODENOSCOPY    . KNEE ARTHROSCOPY Right 2007  . KNEE ARTHROSCOPY  07/13/2011   Procedure: ARTHROSCOPY KNEE;  Surgeon: Cynda Familia;  Location: [place];  Service: Orthopedics;  Laterality: Left;  partial menisectomy with chondrylplasty  . KNEE SURGERY Left   . LAMINECTOMY AND MICRODISCECTOMY LUMBAR SPINE  MARCH  2008   L3 -  4  . LAPAROSCOPIC INCISIONAL / UMBILICAL / VENTRAL HERNIA REPAIR  2006  . PACEMAKER IMPLANT N/A 08/02/2018   Procedure: PACEMAKER IMPLANT;  Surgeon: Evans Lance, MD;  Location: [place] CV [facility];  Service: Cardiovascular;  Laterality: N/A;  . PROSTATE BIOPSY    . SHOULDER ARTHROSCOPY Right 05-23-2007  . SIGMOID COLECTOMY FOR CANCER  1989  . TOTAL KNEE ARTHROPLASTY Left 05/23/2015   Procedure: LEFT TOTAL KNEE ARTHROPLASTY;  Surgeon: Sydnee Cabal, MD;  [facility];  Service: Orthopedics;  Laterality: Left;  [name] Kitchen VERTICAL BANDED GASTROPLASTY  1986    Home Medications:  Allergies as of 02/11/2019      Reactions   Contrast Media [iodinated Diagnostic Agents] Palpitations   TACHYCARDIA- patient states he has tolerated newer agents since this reaction >30 yrs ago      Medication List    Notice   Cannot display discharge medications because the patient has not yet been admitted.     Allergies:  Allergies  Allergen Reactions  . Contrast Media [Iodinated Diagnostic Agents] Palpitations    TACHYCARDIA- patient states he has tolerated newer agents since  this reaction >30 yrs ago    Family History  Problem Relation Age of Onset  . Stroke Paternal Grandfather   . Heart disease Paternal Grandfather   . Esophagitis Father        died from perforated esophagus  . Breast cancer Mother   . Colon cancer Maternal Grandmother        questionable    Social History:  reports that he quit smoking about 44 years ago. His smoking use included cigarettes. He has a 40.00 pack-year smoking history. He has never used smokeless tobacco. He reports current alcohol use. He reports that he does not use drugs.  ROS: A complete review of systems was performed.  All systems are  negative except for pertinent findings as noted.  Physical Exam:  Vital signs in last 24 hours:   Constitutional:  Alert and oriented, No acute distress Cardiovascular: Regular rate  Respiratory: Normal respiratory effort GI: Abdomen is soft, nontender, nondistended, no abdominal masses. No CVAT.  Genitourinary: Normal male phallus, testes are descended bilaterally and non-tender and without masses, scrotum is normal in appearance without lesions or masses, perineum is normal on inspection. Lymphatic: No lymphadenopathy Neurologic: Grossly intact, no focal deficits Psychiatric: Normal mood and affect  Laboratory Data:  No results for input(s): WBC, HGB, HCT, PLT in the last 72 hours.  No results for input(s): NA, K, CL, GLUCOSE, BUN, CALCIUM, CREATININE in the last 72 hours.  Invalid input(s): CO3   No results found for this or any previous visit (from the past 24 hour(s)). Recent Results (from the past 240 hour(s))  SARS CORONAVIRUS 2 Nasal Swab Aptima Multi Swab     Status: None   Collection Time: 02/08/19  1:54 PM   Specimen: Aptima Multi Swab; Nasal Swab  Result Value Ref Range Status   SARS Coronavirus 2 NEGATIVE NEGATIVE Final    Comment: (NOTE) SARS-CoV-2 target nucleic acids are NOT DETECTED. The SARS-CoV-2 RNA is generally detectable in upper and lower respiratory specimens during the acute phase of infection. Negative results do not preclude SARS-CoV-2 infection, do not rule out co-infections with other pathogens, and should not be used as the sole basis for treatment or other patient management decisions. Negative results must be combined with clinical observations, patient history, and epidemiological information. The expected result is Negative. Fact Sheet for Patients: SugarRoll.be Fact Sheet for Healthcare Providers: https://www.woods-mathews.com/ This test is not yet approved or cleared by the [place] FDA and   has been authorized for detection and/or diagnosis of SARS-CoV-2 by FDA under an Emergency Use Authorization (EUA). This EUA will remain  in effect (meaning this test can be used) for the duration of the COVID-19 declaration under Section 56 4(b)(1) of the Act, 21 U.S.C. section 360bbb-3(b)(1), unless the authorization is terminated or revoked sooner. Performed at [facility], [place] [address], [place], [place] [address]     Renal Function: Recent Labs    02/07/19 1446  CREATININE 1.07   Estimated Creatinine Clearance: 68.9 mL/min (by C-G formula based on SCr of 1.07 mg/dL).  Radiologic Imaging: No results found.  Impression/Assessment:  PCA  Plan:  I-125 brachytherapy

## 2019-02-12 ENCOUNTER — Ambulatory Visit (HOSPITAL_COMMUNITY): Payer: Medicare HMO | Admitting: Physician Assistant

## 2019-02-12 ENCOUNTER — Ambulatory Visit (HOSPITAL_COMMUNITY): Payer: Medicare HMO | Admitting: Certified Registered"

## 2019-02-12 ENCOUNTER — Encounter (HOSPITAL_COMMUNITY)
Admission: RE | Disposition: A | Discharge status: Home / Self Care | Payer: Self-pay | Source: Home / Self Care | Attending: Urology

## 2019-02-12 ENCOUNTER — Ambulatory Visit (HOSPITAL_COMMUNITY): Payer: Medicare HMO

## 2019-02-12 ENCOUNTER — Encounter (HOSPITAL_COMMUNITY): Payer: Self-pay

## 2019-02-12 ENCOUNTER — Ambulatory Visit (HOSPITAL_COMMUNITY)
Admission: RE | Admit: 2019-02-12 | Discharge: 2019-02-12 | Disposition: A | Discharge status: Home / Self Care | Payer: Medicare HMO | Attending: Urology | Admitting: Urology

## 2019-02-12 DIAGNOSIS — M109 Gout, unspecified: Secondary | ICD-10-CM | POA: Diagnosis not present

## 2019-02-12 DIAGNOSIS — C61 Malignant neoplasm of prostate: Secondary | ICD-10-CM | POA: Insufficient documentation

## 2019-02-12 DIAGNOSIS — G4730 Sleep apnea, unspecified: Secondary | ICD-10-CM | POA: Insufficient documentation

## 2019-02-12 DIAGNOSIS — E538 Deficiency of other specified B group vitamins: Secondary | ICD-10-CM | POA: Insufficient documentation

## 2019-02-12 DIAGNOSIS — I2510 Atherosclerotic heart disease of native coronary artery without angina pectoris: Secondary | ICD-10-CM | POA: Diagnosis not present

## 2019-02-12 DIAGNOSIS — G4733 Obstructive sleep apnea (adult) (pediatric): Secondary | ICD-10-CM | POA: Diagnosis not present

## 2019-02-12 DIAGNOSIS — M1990 Unspecified osteoarthritis, unspecified site: Secondary | ICD-10-CM | POA: Insufficient documentation

## 2019-02-12 DIAGNOSIS — F17210 Nicotine dependence, cigarettes, uncomplicated: Secondary | ICD-10-CM | POA: Diagnosis not present

## 2019-02-12 DIAGNOSIS — Z950 Presence of cardiac pacemaker: Secondary | ICD-10-CM | POA: Diagnosis not present

## 2019-02-12 DIAGNOSIS — Z20828 Contact with and (suspected) exposure to other viral communicable diseases: Secondary | ICD-10-CM | POA: Insufficient documentation

## 2019-02-12 DIAGNOSIS — Z6838 Body mass index (BMI) 38.0-38.9, adult: Secondary | ICD-10-CM | POA: Insufficient documentation

## 2019-02-12 DIAGNOSIS — Z79899 Other long term (current) drug therapy: Secondary | ICD-10-CM | POA: Insufficient documentation

## 2019-02-12 DIAGNOSIS — E669 Obesity, unspecified: Secondary | ICD-10-CM | POA: Insufficient documentation

## 2019-02-12 DIAGNOSIS — K219 Gastro-esophageal reflux disease without esophagitis: Secondary | ICD-10-CM | POA: Diagnosis not present

## 2019-02-12 DIAGNOSIS — K449 Diaphragmatic hernia without obstruction or gangrene: Secondary | ICD-10-CM | POA: Diagnosis not present

## 2019-02-12 DIAGNOSIS — E559 Vitamin D deficiency, unspecified: Secondary | ICD-10-CM | POA: Insufficient documentation

## 2019-02-12 DIAGNOSIS — I10 Essential (primary) hypertension: Secondary | ICD-10-CM | POA: Insufficient documentation

## 2019-02-12 DIAGNOSIS — E785 Hyperlipidemia, unspecified: Secondary | ICD-10-CM | POA: Insufficient documentation

## 2019-02-12 DIAGNOSIS — F329 Major depressive disorder, single episode, unspecified: Secondary | ICD-10-CM | POA: Insufficient documentation

## 2019-02-12 DIAGNOSIS — K5730 Diverticulosis of large intestine without perforation or abscess without bleeding: Secondary | ICD-10-CM | POA: Insufficient documentation

## 2019-02-12 DIAGNOSIS — Z85038 Personal history of other malignant neoplasm of large intestine: Secondary | ICD-10-CM | POA: Insufficient documentation

## 2019-02-12 HISTORY — PX: SPACE OAR INSTILLATION: SHX6769

## 2019-02-12 HISTORY — PX: RADIOACTIVE SEED IMPLANT: SHX5150

## 2019-02-12 SURGERY — INSERTION, RADIATION SOURCE, PROSTATE
Anesthesia: General

## 2019-02-12 MED ORDER — CEFAZOLIN SODIUM-DEXTROSE 2-4 GM/100ML-% IV SOLN
2.0000 g | Freq: Once | INTRAVENOUS | Status: AC
  Administered 2019-02-12: 2 g via INTRAVENOUS
  Filled 2019-02-12: qty 100

## 2019-02-12 MED ORDER — DEXAMETHASONE SODIUM PHOSPHATE 10 MG/ML IJ SOLN
INTRAMUSCULAR | Status: AC
  Filled 2019-02-12: qty 1

## 2019-02-12 MED ORDER — SODIUM CHLORIDE (PF) 0.9 % IJ SOLN
INTRAMUSCULAR | Status: AC
  Filled 2019-02-12: qty 10

## 2019-02-12 MED ORDER — OXYCODONE HCL 5 MG PO TABS: 5.0000 mg | ORAL_TABLET | Freq: Once | ORAL | Status: DC | PRN

## 2019-02-12 MED ORDER — ONDANSETRON HCL 4 MG/2ML IJ SOLN: 4.0000 mg | Freq: Once | INTRAMUSCULAR | Status: DC | PRN

## 2019-02-12 MED ORDER — FENTANYL CITRATE (PF) 100 MCG/2ML IJ SOLN
INTRAMUSCULAR | Status: AC
  Filled 2019-02-12: qty 2

## 2019-02-12 MED ORDER — LIDOCAINE 2% (20 MG/ML) 5 ML SYRINGE
INTRAMUSCULAR | Status: DC | PRN
  Administered 2019-02-12: 60 mg via INTRAVENOUS

## 2019-02-12 MED ORDER — OXYCODONE HCL 5 MG/5ML PO SOLN: 5.0000 mg | Freq: Once | ORAL | Status: DC | PRN

## 2019-02-12 MED ORDER — FENTANYL CITRATE (PF) 100 MCG/2ML IJ SOLN
25.0000 ug | INTRAMUSCULAR | Status: DC | PRN
  Administered 2019-02-12 (×2): 25 ug via INTRAVENOUS

## 2019-02-12 MED ORDER — PROPOFOL 10 MG/ML IV BOLUS
INTRAVENOUS | Status: AC
  Filled 2019-02-12: qty 20

## 2019-02-12 MED ORDER — ONDANSETRON HCL 4 MG/2ML IJ SOLN
INTRAMUSCULAR | Status: AC
  Filled 2019-02-12: qty 2

## 2019-02-12 MED ORDER — FENTANYL CITRATE (PF) 100 MCG/2ML IJ SOLN
INTRAMUSCULAR | Status: AC
  Administered 2019-02-12: 10:00:00 25 ug via INTRAVENOUS
  Filled 2019-02-12: qty 2

## 2019-02-12 MED ORDER — LIDOCAINE 2% (20 MG/ML) 5 ML SYRINGE
INTRAMUSCULAR | Status: AC
  Filled 2019-02-12: qty 5

## 2019-02-12 MED ORDER — STERILE WATER FOR IRRIGATION IR SOLN
Status: DC | PRN
  Administered 2019-02-12: 3000 mL

## 2019-02-12 MED ORDER — FENTANYL CITRATE (PF) 100 MCG/2ML IJ SOLN
INTRAMUSCULAR | Status: DC | PRN
  Administered 2019-02-12: 25 ug via INTRAVENOUS
  Administered 2019-02-12: 50 ug via INTRAVENOUS
  Administered 2019-02-12: 25 ug via INTRAVENOUS

## 2019-02-12 MED ORDER — SODIUM CHLORIDE (PF) 0.9 % IJ SOLN
INTRAMUSCULAR | Status: DC | PRN
  Administered 2019-02-12: 10 mL

## 2019-02-12 MED ORDER — LACTATED RINGERS IV SOLN
INTRAVENOUS | Status: DC
  Administered 2019-02-12: 06:00:00 via INTRAVENOUS

## 2019-02-12 MED ORDER — ONDANSETRON HCL 4 MG/2ML IJ SOLN
INTRAMUSCULAR | Status: DC | PRN
  Administered 2019-02-12: 4 mg via INTRAVENOUS

## 2019-02-12 MED ORDER — PROPOFOL 10 MG/ML IV BOLUS
INTRAVENOUS | Status: DC | PRN
  Administered 2019-02-12: 150 mg via INTRAVENOUS

## 2019-02-12 MED ORDER — DEXAMETHASONE SODIUM PHOSPHATE 10 MG/ML IJ SOLN
INTRAMUSCULAR | Status: DC | PRN
  Administered 2019-02-12: 4 mg via INTRAVENOUS

## 2019-02-12 MED ORDER — IOHEXOL 300 MG/ML  SOLN
INTRAMUSCULAR | Status: DC | PRN
  Administered 2019-02-12: 7 mL

## 2019-02-12 SURGICAL SUPPLY — 32 items
BAG URINE DRAINAGE (UROLOGICAL SUPPLIES) ×2 IMPLANT
BLADE CLIPPER SURG (BLADE) ×2 IMPLANT
CATH FOLEY 2WAY SLVR  5CC 16FR (CATHETERS) ×1
CATH FOLEY 2WAY SLVR 5CC 16FR (CATHETERS) ×1 IMPLANT
CATH ROBINSON RED A/P 20FR (CATHETERS) ×2 IMPLANT
CONT SPEC 4OZ CLIKSEAL STRL BL (MISCELLANEOUS) ×4 IMPLANT
COVER SURGICAL LIGHT HANDLE (MISCELLANEOUS) ×2 IMPLANT
COVER WAND RF STERILE (DRAPES) IMPLANT
DRAPE SURG IRRIG POUCH 19X23 (DRAPES) ×1 IMPLANT
DRSG TEGADERM 4X4.75 (GAUZE/BANDAGES/DRESSINGS) ×2 IMPLANT
DRSG TEGADERM 8X12 (GAUZE/BANDAGES/DRESSINGS) ×2 IMPLANT
GAUZE SPONGE 4X4 12PLY STRL (GAUZE/BANDAGES/DRESSINGS) ×1 IMPLANT
GLOVE BIO SURGEON STRL SZ7.5 (GLOVE) ×2 IMPLANT
GLOVE BIOGEL M 8.0 STRL (GLOVE) ×2 IMPLANT
GLOVE ECLIPSE 8.0 STRL XLNG CF (GLOVE) ×2 IMPLANT
GLOVE SURG SS PI 8.0 STRL IVOR (GLOVE) IMPLANT
GOWN STRL REUS W/TWL LRG LVL3 (GOWN DISPOSABLE) ×2 IMPLANT
GOWN STRL REUS W/TWL XL LVL3 (GOWN DISPOSABLE) ×2 IMPLANT
HOLDER FOLEY CATH W/STRAP (MISCELLANEOUS) ×1 IMPLANT
IMPL SPACEOAR SYSTEM 10ML (Spacer) ×1 IMPLANT
IMPLANT SPACEOAR SYSTEM 10ML (Spacer) ×2 IMPLANT
KIT TURNOVER KIT A (KITS) ×2 IMPLANT
MARKER SKIN DUAL TIP RULER LAB (MISCELLANEOUS) ×3 IMPLANT
PACK CYSTO (CUSTOM PROCEDURE TRAY) ×2 IMPLANT
STRIP CLOSURE SKIN 1/2X4 (GAUZE/BANDAGES/DRESSINGS) IMPLANT
SURGILUBE 2OZ TUBE FLIPTOP (MISCELLANEOUS) ×1 IMPLANT
SYR 10ML LL (SYRINGE) ×2 IMPLANT
TAPE CLOTH SURG 6X10 WHT LF (GAUZE/BANDAGES/DRESSINGS) ×1 IMPLANT
TOWEL OR 17X26 10 PK STRL BLUE (TOWEL DISPOSABLE) ×2 IMPLANT
UNDERPAD 30X30 (UNDERPADS AND DIAPERS) ×4 IMPLANT
WATER STERILE IRR 1000ML UROMA (IV SOLUTION) ×1 IMPLANT
WATER STERILE IRR 500ML POUR (IV SOLUTION) ×1 IMPLANT

## 2019-02-12 NOTE — Op Note (Signed)
Preoperative diagnosis: Clinical stage TI C adenocarcinoma the prostate   Postoperative diagnosis: Same   Procedure: I-125 prostate seed implantation, SpaceOAR placement flexible cystoscopy  Surgeon: Lillette Boxer. Daichi Moris M.D.  Radiation Oncologist: Tyler Pita, M.D.  Anesthesia: Gen.   Indications: Patient  was diagnosed with clinical stage TI C prostate cancer. We had extensive discussion with him about treatment options versus. He elected to proceed with seed implantation. He underwent consultation my office as well as with Dr. Tammi Klippel. He appeared to understand the advantages disadvantages potential risks of this treatment option. Full informed consent has been obtained.   Technique and findings: Patient was brought the operating room where he had successful induction of general anesthesia. He was placed in dorso-lithotomy position and prepped and draped in usual manner. Appropriate surgical timeout was performed. Radiation oncology department placed a transrectal ultrasound probe anchoring stand. Foley catheter with contrast in the balloon was inserted without difficulty. Anchoring needles were placed within the prostate. Rectal tube was placed. Real-time contouring of the urethra prostate and rectum were performed and the dosing parameters were established. Targeted dose was 145 gray.  I was then called  to the operating suite suite for placement of the needles. A second timeout was performed. All needle passage was done with real-time transrectal ultrasound guidance with the sagittal plane. A total of 25 needles were placed.  64 active seeds were implanted.  I then proceeded with placement of SpaceOAR by introducing a needle with the bevel angled inferiorly approximately 2 cm superior to the anus. This was angled downward and under direct ultrasound was placed within the space between the prostatic capsule and rectum. This was confirmed with a small amount of sterile saline injected and  this was performed under direct ultrasound. I then attached the SpaceOAR to the needle and injected this in the space between the prostate and rectum with good placement noted. The Foley catheter was removed and flexible cystoscopy failed to show any seeds outside the prostate.  The patient was brought to recovery room in stable condition, having tolerated the procedure well.Marcus [name]

## 2019-02-12 NOTE — Anesthesia Postprocedure Evaluation (Signed)
Anesthesia Post Note  Patient: Marcus [name]  Procedure(s) Performed: RADIOACTIVE SEED IMPLANT/BRACHYTHERAPY IMPLANT (N/A ) SPACE OAR INSTILLATION (N/A )     Patient location during evaluation: PACU Anesthesia Type: General Level of consciousness: awake and alert Pain management: pain level controlled Vital Signs Assessment: post-procedure vital signs reviewed and stable Respiratory status: spontaneous breathing, nonlabored ventilation and respiratory function stable Cardiovascular status: blood pressure returned to baseline and stable Postop Assessment: no apparent nausea or vomiting Anesthetic complications: no    Last Vitals:  Vitals:   02/12/19 1000 02/12/19 1020  BP: (!) 157/79 (!) 148/82  Pulse: 64 63  Resp: 13 16  Temp: 36.6 C 36.7 C  SpO2: 96% 98%    Last Pain:  Vitals:   02/12/19 1020  TempSrc:   PainSc: 2                  Audry Pili

## 2019-02-12 NOTE — Discharge Instructions (Signed)
Radioactive Seed Implant Home Care Instructions   Activity:    Rest for the remainder of the day.  Do not drive or operate equipment today.  You may resume normal  activities in a few days as instructed by your physician, without risk of harmful radiation exposure to those around you, provided you follow the time and distance precautions on the Radiation Oncology Instruction Sheet.   Meals: Drink plenty of lipuids and eat light foods, such as gelatin or soup this evening .  You may return to normal meal plan tomorrow.  Return To Work: You may return to work as instructed by your physician.  Special Instruction:   If any seeds are found, use tweezers to pick up seeds and place in a glass container of any kind and bring to your physician's office.  Call your physician if any of these symptoms occur:  Persistent or heavy bleeding Urine stream diminishes or stops completely after catheter is removed Fever equal to or greater than 101 degrees F Cloudy urine with a strong foul odor Severe pain  You may feel some burning pain and/or hesitancy when you urinate after the catheter is removed.  These symptoms may increase over the next few weeks, but should diminish within forur to six weeks.  Applying moist heat to the lower abdomen or a hot tub bath may help relieve the pain.  If the discomfort becomes severe, please call your physician for additional medications.   

## 2019-02-12 NOTE — Anesthesia Procedure Notes (Signed)
Procedure Name: LMA Insertion Date/Time: 02/12/2019 7:45 AM Performed by: Niel Hummer, CRNA Pre-anesthesia Checklist: Patient identified, Emergency Drugs available, Suction available and Patient being monitored Patient Re-evaluated:Patient Re-evaluated prior to induction Oxygen Delivery Method: Circle system utilized Preoxygenation: Pre-oxygenation with 100% oxygen Induction Type: IV induction LMA: LMA inserted LMA Size: 5.0 Dental Injury: Teeth and Oropharynx as per pre-operative assessment

## 2019-02-12 NOTE — Progress Notes (Signed)
  Radiation Oncology         [phone] ________________________________  Name: [name] MRN: [id number]  Date: 02/12/2019  DOB: Jan 08, 1941       Prostate Seed Implant  BX:[id number], Tommi Rumps, NP  No ref. provider found  DIAGNOSIS: 78 y.o. gentleman with Stage T1c adenocarcinoma of the prostate with Gleason score of 3+4, and PSA of 9.03  PROCEDURE: Insertion of radioactive I-125 seeds into the prostate gland.  RADIATION DOSE: 145 Gy, definitive therapy.  TECHNIQUE: [name] was brought to the operating room with the urologist. He was placed in the dorsolithotomy position. He was catheterized and a rectal tube was inserted. The perineum was shaved, prepped and draped. The ultrasound probe was then introduced into the rectum to see the prostate gland.  TREATMENT DEVICE: A needle grid was attached to the ultrasound probe stand and anchor needles were placed.  3D PLANNING: The prostate was imaged in 3D using a sagittal sweep of the prostate probe. These images were transferred to the planning computer. There, the prostate, urethra and rectum were defined on each axial reconstructed image. Then, the software created an optimized 3D plan and a few seed positions were adjusted. The quality of the plan was reviewed using [facility] information for the target and the following two organs at risk:  Urethra and Rectum.  Then the accepted plan was printed and handed off to the radiation therapist.  Under my supervision, the custom loading of the seeds and spacers was carried out and loaded into sealed vicryl sleeves.  These pre-loaded needles were then placed into the needle holder.[name] Kitchen  PROSTATE VOLUME STUDY:  Using transrectal ultrasound the volume of the prostate was verified to be 38.5 cc.  SPECIAL TREATMENT PROCEDURE/SUPERVISION AND HANDLING: The pre-loaded needles were then delivered under sagittal guidance. A total of 25 needles were used to deposit 64 seeds in the prostate gland. The individual  seed activity was 0.508 mCi.  SpaceOAR:  Yes  COMPLEX SIMULATION: At the end of the procedure, an anterior radiograph of the pelvis was obtained to document seed positioning and count. Cystoscopy was performed to check the urethra and bladder.  MICRODOSIMETRY: At the end of the procedure, the patient was emitting 0.073 mR/hr at 1 meter. Accordingly, he was considered safe for hospital discharge.  PLAN: The patient will return to the radiation oncology clinic for post implant CT dosimetry in three weeks.   ________________________________  Sheral Apley Tammi Klippel, M.D.

## 2019-02-12 NOTE — Transfer of Care (Signed)
Immediate Anesthesia Transfer of Care Note  Patient: Marcus [name]  Procedure(s) Performed: RADIOACTIVE SEED IMPLANT/BRACHYTHERAPY IMPLANT (N/A ) SPACE OAR INSTILLATION (N/A )  Patient Location: PACU  Anesthesia Type:General  Level of Consciousness: awake, alert  and oriented  Airway & Oxygen Therapy: Patient Spontanous Breathing and Patient connected to face mask oxygen  Post-op Assessment: Report given to RN and Post -op Vital signs reviewed and stable  Post vital signs: Reviewed and stable  Last Vitals:  Vitals Value Taken Time  BP    Temp    Pulse 71 02/12/19 0921  Resp 15 02/12/19 0921  SpO2 100 % 02/12/19 0921  Vitals shown include unvalidated device data.  Last Pain:  Vitals:   02/12/19 0621  TempSrc:   PainSc: 0-No pain         Complications: No apparent anesthesia complications

## 2019-02-12 NOTE — Interval H&P Note (Signed)
History and Physical Interval Note:  02/12/2019 7:34 AM  Marcus [name]  has presented today for surgery, with the diagnosis of PROSTATE CANCER.  The various methods of treatment have been discussed with the patient and family. After consideration of risks, benefits and other options for treatment, the patient has consented to  Procedure(s) with comments: RADIOACTIVE SEED IMPLANT/BRACHYTHERAPY IMPLANT (N/A) - 90 MINS SPACE OAR INSTILLATION (N/A) as a surgical intervention.  The patient's history has been reviewed, patient examined, no change in status, stable for surgery.  I have reviewed the patient's chart and labs.  Questions were answered to the patient's satisfaction.     Lillette Boxer Dontrail Blackwell

## 2019-02-13 ENCOUNTER — Encounter (HOSPITAL_COMMUNITY): Payer: Self-pay | Admitting: Urology

## 2019-02-16 ENCOUNTER — Ambulatory Visit (INDEPENDENT_AMBULATORY_CARE_PROVIDER_SITE_OTHER): Payer: Medicare HMO | Admitting: *Deleted

## 2019-02-16 DIAGNOSIS — I4430 Unspecified atrioventricular block: Secondary | ICD-10-CM | POA: Diagnosis not present

## 2019-02-16 DIAGNOSIS — I459 Conduction disorder, unspecified: Secondary | ICD-10-CM

## 2019-02-16 LAB — CUP PACEART REMOTE DEVICE CHECK
Date Time Interrogation Session: 20200814114418
Implantable Lead Implant Date: 20200129
Implantable Lead Implant Date: 20200129
Implantable Lead Location: 753859
Implantable Lead Location: 753860
Implantable Lead Model: 377
Implantable Lead Model: 377
Implantable Lead Serial Number: 80947537
Implantable Lead Serial Number: 80970966
Implantable Pulse Generator Implant Date: 20200129
Pulse Gen Model: 407145
Pulse Gen Serial Number: 69503797

## 2019-02-26 ENCOUNTER — Encounter: Payer: Self-pay | Admitting: Urology

## 2019-02-26 ENCOUNTER — Telehealth: Payer: Self-pay | Admitting: *Deleted

## 2019-02-26 NOTE — Progress Notes (Signed)
Remote pacemaker transmission.   

## 2019-02-26 NOTE — Progress Notes (Signed)
Patient with pacemaker implant that is compatible with MRI which is scheduled for 03-01-19 @ [facility]- they have a rep. coming due to the pacemaker that this patient has.   [name], MMS, PA-C [place] [facility] [place]: [phone]  Fax: [phone]

## 2019-02-26 NOTE — Telephone Encounter (Signed)
Returned patient's phone call, spoke with patient 

## 2019-02-28 ENCOUNTER — Other Ambulatory Visit: Payer: Self-pay | Admitting: *Deleted

## 2019-02-28 ENCOUNTER — Encounter: Payer: Self-pay | Admitting: Urology

## 2019-02-28 ENCOUNTER — Telehealth: Payer: Self-pay | Admitting: *Deleted

## 2019-02-28 DIAGNOSIS — Z20822 Contact with and (suspected) exposure to covid-19: Secondary | ICD-10-CM

## 2019-02-28 DIAGNOSIS — R6889 Other general symptoms and signs: Secondary | ICD-10-CM | POA: Diagnosis not present

## 2019-02-28 NOTE — Telephone Encounter (Signed)
CALLED PATIENT TO REMIND OF POST SEED APPTS. AND HIS MRI FOR 03-01-19, SPOKE WITH PATIENT AND HE IS AWARE OF THESE APPTS.

## 2019-03-01 ENCOUNTER — Ambulatory Visit: Payer: Medicare HMO | Admitting: Urology

## 2019-03-01 ENCOUNTER — Ambulatory Visit (HOSPITAL_COMMUNITY): Admission: RE | Admit: 2019-03-01 | Payer: Medicare HMO | Source: Ambulatory Visit

## 2019-03-01 ENCOUNTER — Ambulatory Visit: Payer: Medicare HMO | Admitting: Radiation Oncology

## 2019-03-01 LAB — NOVEL CORONAVIRUS, NAA: SARS-CoV-2, NAA: NOT DETECTED

## 2019-03-01 NOTE — Progress Notes (Signed)
The above patient has called and reported a recent exposure to COVID 19. He has been tested but will not have the results for three days.  He is currently scheduled for post-seed CT SIM and MRI prostate on 03/01/19 but we will postpone these appointments until we have confirmed negative COVID-19 testing, just to be safe.  [name] will work with him to reschedule these visits once the COVID-19 test results are available.  [name], MMS, PA-C [place] [facility] [place]: [phone]  Fax: [phone]

## 2019-03-02 ENCOUNTER — Other Ambulatory Visit: Payer: Self-pay | Admitting: Adult Health

## 2019-03-02 ENCOUNTER — Telehealth (INDEPENDENT_AMBULATORY_CARE_PROVIDER_SITE_OTHER): Payer: Medicare HMO | Admitting: Adult Health

## 2019-03-02 ENCOUNTER — Telehealth: Payer: Self-pay | Admitting: Adult Health

## 2019-03-02 ENCOUNTER — Other Ambulatory Visit: Payer: Self-pay

## 2019-03-02 DIAGNOSIS — F5104 Psychophysiologic insomnia: Secondary | ICD-10-CM | POA: Diagnosis not present

## 2019-03-02 MED ORDER — TRAZODONE HCL 50 MG PO TABS: 50.0000 mg | ORAL_TABLET | Freq: Every day | ORAL | 0 refills | Status: DC

## 2019-03-02 NOTE — Telephone Encounter (Signed)
DENIED.  FILLED ON 12/07/2018 FOR 6 MONTHS.  REQUEST IS TOO EARLY.

## 2019-03-02 NOTE — Progress Notes (Signed)
Virtual Visit via Video Note  I connected with Marcus [name]  on 03/02/19 at  2:30 PM EDT by a video enabled telemedicine application and verified that I am speaking with the correct person using two identifiers.  Location patient: home Location provider:work or home office Persons participating in the virtual visit: patient, provider  I discussed the limitations of evaluation and management by telemedicine and the availability of in person appointments. The patient expressed understanding and agreed to proceed.   HPI: 78 year old male who is being evaluated today for insomnia.  He reports that over the last 3 weeks his insomnia has been worse, he has been having to take 3-4 tabs of melatonin every night and this still does not allow him to sleep soundly.  He reports may be getting 2 to 3 hours asleep at night.  He is having trouble falling asleep as well as staying asleep.  He feels as though some of his increased insomnia may be due to anxiety and possibly depression, he recently lost his wife and is currently going through treatment for prostate cancer.  He denies suicidal ideation   ROS: See pertinent positives and negatives per HPI.  Past Medical History:  Diagnosis Date  . Acute meniscal tear of knee left  . Arthritis    generalized  . Calcaneal spur   . Colon cancer [facility]) 1983   surgical removed. did not receive radiation or chemotherapy.  . Coronary artery disease    dr Stanford Breed  . Cramps of lower extremity    in the mornings occ.  . Depression   . Diverticulosis of colon (without mention of hemorrhage)   . Echocardiogram findings abnormal, without diagnosis 04-23-2010   normal LV function, mod. left atrial enlargement, mild right artrial enlargement and grade 1 diastolic dysfunction  . Edema of lower extremity    ankles, elevates legs  . Elevated prostate specific antigen (PSA)   . Esophageal reflux   . External hemorrhoids   . Frequency of urination    followed by dr  dalhstedt  . Gout   . Hiatal hernia   . History of colon cancer 1988  . Hyperlipidemia   . Lumbar spondylolysis   . Malaria 1964  . Normal nuclear stress test 04-23-2010   ef 51%,  septal hypokinesis, normal perfusion  . OSA (obstructive sleep apnea)    does not use CPAP   . Paraesophageal hernia    large  . Personal history of colonic polyps 08/21/2009   TUBULAR ADENOMAS  . Pneumonia    history of; last episode 2015  . Postgastric surgery syndromes   . Presence of permanent cardiac pacemaker   . Prostate cancer ([place]) 2016  . Unspecified essential hypertension   . Unspecified vitamin D deficiency   . Ventral hernia   . Vitamin B12 deficiency     Past Surgical History:  Procedure Laterality Date  . CARDIAC CATHETERIZATION    . CATARACT EXTRACTION W/ INTRAOCULAR LENS  IMPLANT, BILATERAL Bilateral   . COLONOSCOPY    . ESOPHAGOGASTRODUODENOSCOPY    . KNEE ARTHROSCOPY Right 2007  . KNEE ARTHROSCOPY  07/13/2011   Procedure: ARTHROSCOPY KNEE;  Surgeon: Cynda Familia;  Location: [facility];  Service: Orthopedics;  Laterality: Left;  partial menisectomy with chondrylplasty  . KNEE SURGERY Left   . LAMINECTOMY AND MICRODISCECTOMY LUMBAR SPINE  MARCH  2008   L3 -  4  . LAPAROSCOPIC INCISIONAL / UMBILICAL / VENTRAL HERNIA REPAIR  2006  . PACEMAKER IMPLANT  N/A 08/02/2018   Procedure: PACEMAKER IMPLANT;  Surgeon: Evans Lance, MD;  Location: [facility];  Service: Cardiovascular;  Laterality: N/A;  . PROSTATE BIOPSY    . RADIOACTIVE SEED IMPLANT N/A 02/12/2019   Procedure: RADIOACTIVE SEED IMPLANT/BRACHYTHERAPY IMPLANT;  Surgeon: Franchot Gallo, MD;  Location: [facility];  Service: Urology;  Laterality: N/A;  90 MINS  . SHOULDER ARTHROSCOPY Right 05-23-2007  . SIGMOID COLECTOMY FOR CANCER  1989  . SPACE OAR INSTILLATION N/A 02/12/2019   Procedure: SPACE OAR INSTILLATION;  Surgeon: Franchot Gallo, MD;  Location: [facility];  Service: Urology;  Laterality:  N/A;  . TOTAL KNEE ARTHROPLASTY Left 05/23/2015   Procedure: LEFT TOTAL KNEE ARTHROPLASTY;  Surgeon: Sydnee Cabal, MD;  Location: [facility];  Service: Orthopedics;  Laterality: Left;  [name] Kitchen VERTICAL BANDED GASTROPLASTY  1986    Family History  Problem Relation Age of Onset  . Stroke Paternal Grandfather   . Heart disease Paternal Grandfather   . Esophagitis Father        died from perforated esophagus  . Breast cancer Mother   . Colon cancer Maternal Grandmother        questionable      Current Outpatient Medications:  .  allopurinol (ZYLOPRIM) 300 MG tablet, TAKE 1 TABLET EVERY DAY, Disp: 90 tablet, Rfl: 0 .  amLODipine (NORVASC) 10 MG tablet, TAKE 1 TABLET EVERY DAY, Disp: 90 tablet, Rfl: 0 .  Calcium Carb-Cholecalciferol (CALCIUM 600 + D PO), Take 1 tablet by mouth daily., Disp: , Rfl:  .  cetirizine (ZYRTEC) 10 MG tablet, Take 10 mg by mouth daily., Disp: , Rfl:  .  Cholecalciferol (VITAMIN D) 2000 UNITS CAPS, Take 1 capsule by mouth daily., Disp: , Rfl:  .  Cyanocobalamin (VITAMIN B-12) 5000 MCG TBDP, Take 1 tablet by mouth daily. , Disp: , Rfl:  .  Docusate Calcium (STOOL SOFTENER PO), Take 1 tablet by mouth daily., Disp: , Rfl:  .  finasteride (PROSCAR) 5 MG tablet, Take 5 mg by mouth daily., Disp: , Rfl:  .  magnesium oxide (MAG-OX) 400 MG tablet, Take 400 mg by mouth daily., Disp: , Rfl:  .  Multiple Vitamins-Minerals (MULTIVITAMIN WITH MINERALS) tablet, Take 1 tablet by mouth daily., Disp: , Rfl:  .  omeprazole (PRILOSEC) 40 MG capsule, Take 1 capsule (40 mg total) by mouth daily., Disp: 90 capsule, Rfl: 1 .  oxymetazoline (AFRIN) 0.05 % nasal spray, Place 1 spray into both nostrils at bedtime., Disp: , Rfl:  .  Potassium 99 MG TABS, Take 99 mg by mouth daily. , Disp: , Rfl:  .  simvastatin (ZOCOR) 20 MG tablet, Take 1 tablet (20 mg total) by mouth at bedtime., Disp: 90 tablet, Rfl: 1 .  tamsulosin (FLOMAX) 0.4 MG CAPS capsule, Take 0.4 mg by mouth daily. , Disp: , Rfl:  .   valsartan-hydrochlorothiazide (DIOVAN-HCT) 320-25 MG tablet, TAKE 1 TABLET EVERY DAY, Disp: 90 tablet, Rfl: 0  EXAM:  VITALS per patient if applicable:  GENERAL: alert, oriented, appears well and in no acute distress  HEENT: atraumatic, conjunttiva clear, no obvious abnormalities on inspection of external nose and ears  NECK: normal movements of the head and neck  LUNGS: on inspection no signs of respiratory distress, breathing rate appears normal, no obvious gross SOB, gasping or wheezing  CV: no obvious cyanosis  MS: moves all visible extremities without noticeable abnormality  PSYCH/NEURO: pleasant and cooperative, no obvious depression or anxiety, speech and thought processing grossly intact  ASSESSMENT AND  PLAN:  Discussed the following assessment and plan:  1. Psychophysiological insomnia -He is no doubt had a lot happen to him over the last few months.  Due to insomnia and probable depression, will prescribe trazodone 50 mg tablets nightly.  Was advised to give this dose a week and if no improvement he can increase to 100 mg. - traZODone (DESYREL) 50 MG tablet; Take 1 tablet (50 mg total) by mouth at bedtime.  Dispense: 90 tablet; Refill: 0     I discussed the assessment and treatment plan with the patient. The patient was provided an opportunity to ask questions and all were answered. The patient agreed with the plan and demonstrated an understanding of the instructions.   The patient was advised to call back or seek an in-person evaluation if the symptoms worsen or if the condition fails to improve as anticipated.   Dorothyann Peng, NP

## 2019-03-05 ENCOUNTER — Telehealth: Payer: Self-pay | Admitting: *Deleted

## 2019-03-05 NOTE — Telephone Encounter (Signed)
Returned patient's phone call, spoke with patient 

## 2019-03-07 ENCOUNTER — Telehealth: Payer: Self-pay | Admitting: *Deleted

## 2019-03-07 DIAGNOSIS — G4733 Obstructive sleep apnea (adult) (pediatric): Secondary | ICD-10-CM | POA: Diagnosis not present

## 2019-03-07 NOTE — Telephone Encounter (Signed)
CALLED PATIENT TO INFORM OF POST SEED APPTS. FOR 03-09-19, SPOKE WITH PATIENT AND HE IS AWARE OF THESE APPTS.

## 2019-03-08 ENCOUNTER — Telehealth: Payer: Self-pay | Admitting: *Deleted

## 2019-03-08 NOTE — Telephone Encounter (Signed)
Called patient to remind of post seed appts. for 03-09-19, spoke with patient and he is aware of these appts.

## 2019-03-08 NOTE — Progress Notes (Signed)
  Radiation Oncology         [phone] ________________________________  Name: Marcus [name] MRN: [id number]  Date: 03/09/2019  DOB: September 08, 1940  COMPLEX SIMULATION NOTE  NARRATIVE:  The patient was brought to the [facility] suite today following prostate seed implantation approximately one month ago.  Identity was confirmed.  All relevant records and images related to the planned course of therapy were reviewed.  Then, the patient was set-up supine.  CT images were obtained.  The CT images were loaded into the planning software.  Then the prostate and rectum were contoured.  Treatment planning then occurred.  The implanted iodine 125 seeds were identified by the physics staff for projection of radiation distribution  I have requested : 3D Simulation  I have requested a DVH of the following structures: Prostate and rectum.    ________________________________  Sheral Apley Tammi Klippel, M.D.  This document serves as a record of services personally performed by Tyler Pita, MD. It was created on his behalf by Wilburn Mylar, a trained medical scribe. The creation of this record is based on the scribe's personal observations and the provider's statements to them. This document has been checked and approved by the attending provider.

## 2019-03-09 ENCOUNTER — Ambulatory Visit
Admission: RE | Admit: 2019-03-09 | Discharge: 2019-03-09 | Disposition: A | Discharge status: Home / Self Care | Payer: Medicare HMO | Source: Ambulatory Visit | Attending: Radiation Oncology | Admitting: Radiation Oncology

## 2019-03-09 ENCOUNTER — Ambulatory Visit
Admission: RE | Admit: 2019-03-09 | Discharge: 2019-03-09 | Disposition: A | Discharge status: Home / Self Care | Payer: Medicare HMO | Source: Ambulatory Visit | Attending: Urology | Admitting: Urology

## 2019-03-09 ENCOUNTER — Other Ambulatory Visit: Payer: Self-pay

## 2019-03-09 ENCOUNTER — Encounter: Payer: Self-pay | Admitting: Urology

## 2019-03-09 VITALS — BP 136/66 | HR 60 | Temp 98.3°F | Resp 20 | Wt 258.2 lb

## 2019-03-09 DIAGNOSIS — Z923 Personal history of irradiation: Secondary | ICD-10-CM | POA: Insufficient documentation

## 2019-03-09 DIAGNOSIS — C61 Malignant neoplasm of prostate: Secondary | ICD-10-CM | POA: Diagnosis not present

## 2019-03-09 NOTE — Progress Notes (Signed)
Marcus [name] is here for a  Post seed follow-up appointment. He will not have a MRI due to having a pacemaker.Patient states that he has an appointment with his urologist next week. Patient denies any dysuria  Or hematuria. Patient states that he has some leakage an urgency with urination. Patient states that his stream is moderate to weak. Patient states that he empties his bladder with urination. Vitals:   03/09/19 0807  BP: 136/66  Pulse: 60  Resp: 20  Temp: 98.3 F (36.8 C)  TempSrc: Oral  SpO2: 97%  Weight: 258 lb 4 oz (117.1 kg)

## 2019-03-09 NOTE — Progress Notes (Signed)
Radiation Oncology         ([phone] ________________________________  Name: Marcus [name] MRN: [id number]  Date: 03/09/2019  DOB: 09-06-1940  Post-Seed Follow-Up Visit Note  CC: Dorothyann Peng, NP  Dorothyann Peng, NP  Diagnosis:   78 y.o. gentleman with Stage T1c adenocarcinoma of the prostate with Gleason score of 3+4, and PSA of 9.03    ICD-10-CM   1. Malignant neoplasm of prostate ([place])  C61     Interval Since Last Radiation:  3.5 weeks 02/12/19:  Insertion of radioactive I-125 seeds into the prostate gland; 145 Gy, definitive/boost therapy with placement of SpaceOAR gel.  Narrative:  The patient returns today for routine follow-up.  He is complaining of increased urinary frequency, urgency and urinary hesitation symptoms. He filled out a questionnaire regarding urinary function today providing and overall IPSS score of 25 characterizing his symptoms as severe with increased daytime and night time frequency, urgency, weak stream and intermittency but feels that he empties his bladder on voiding most of the time.  He specifically denies gross hematuria, dysuria, fever, chills or night sweats.  He has occasional scant leakage associated with urgency.  His pre-implant score was 6. He denies any abdominal pain or bowel symptoms.  ALLERGIES:  is allergic to contrast media [iodinated diagnostic agents].  Meds: Current Outpatient Medications  Medication Sig Dispense Refill  . allopurinol (ZYLOPRIM) 300 MG tablet TAKE 1 TABLET EVERY DAY 90 tablet 0  . amLODipine (NORVASC) 10 MG tablet TAKE 1 TABLET EVERY DAY 90 tablet 0  . Calcium Carb-Cholecalciferol (CALCIUM 600 + D PO) Take 1 tablet by mouth daily.    . cetirizine (ZYRTEC) 10 MG tablet Take 10 mg by mouth daily.    . Cholecalciferol (VITAMIN D) 2000 UNITS CAPS Take 1 capsule by mouth daily.    . Cyanocobalamin (VITAMIN B-12) 5000 MCG TBDP Take 1 tablet by mouth daily.     [name] Calcium (STOOL SOFTENER PO) Take 1 tablet by  mouth daily.    . finasteride (PROSCAR) 5 MG tablet Take 5 mg by mouth daily.    . magnesium oxide (MAG-OX) 400 MG tablet Take 400 mg by mouth daily.    . Multiple Vitamins-Minerals (MULTIVITAMIN WITH MINERALS) tablet Take 1 tablet by mouth daily.    [name] Kitchen omeprazole (PRILOSEC) 40 MG capsule Take 1 capsule (40 mg total) by mouth daily. 90 capsule 1  . oxymetazoline (AFRIN) 0.05 % nasal spray Place 1 spray into both nostrils at bedtime.    . Potassium 99 MG TABS Take 99 mg by mouth daily.     . simvastatin (ZOCOR) 20 MG tablet Take 1 tablet (20 mg total) by mouth at bedtime. 90 tablet 1  . tamsulosin (FLOMAX) 0.4 MG CAPS capsule Take 0.4 mg by mouth daily.     . traZODone (DESYREL) 50 MG tablet Take 1 tablet (50 mg total) by mouth at bedtime. 90 tablet 0  . valsartan-hydrochlorothiazide (DIOVAN-HCT) 320-25 MG tablet TAKE 1 TABLET EVERY DAY 90 tablet 0   No current facility-administered medications for this encounter.     Physical Findings: In general this is a well appearing Caucasian male in no acute distress. He's alert and oriented x4 and appropriate throughout the examination. Cardiopulmonary assessment is negative for acute distress and he exhibits normal effort.   Lab Findings: Lab Results  Component Value Date   WBC 6.2 02/07/2019   HGB 11.8 (L) 02/07/2019   HCT 37.3 (L) 02/07/2019   MCV 92.8 02/07/2019   PLT  230 02/07/2019    Radiographic Findings:  Patient underwent CT imaging in our clinic for post implant dosimetry. The CT will be reviewed by Dr. Tammi Klippel to confirm there is an adequate distribution of radioactive seeds throughout the prostate gland and ensure that there are no seeds in or near the rectum. He will NOT have a prostate MRI due to having permanent pacemaker implant. We suspect the final radiation plan and dosimetry will show appropriate coverage of the prostate gland. He understands that we will call and inform him of any unexpected findings on further review of his  imaging and dosimetry.  Impression/Plan: 78 y.o. gentleman with Stage T1c adenocarcinoma of the prostate with Gleason score of 3+4, and PSA of 9.03 The patient is recovering from the effects of radiation. His urinary symptoms should gradually improve over the next 4-6 months. We talked about this today. He is encouraged by his improvement already and is otherwise pleased with his outcome. We also talked about long-term follow-up for prostate cancer following seed implant. He understands that ongoing PSA determinations and digital rectal exams will help perform surveillance to rule out disease recurrence. He has a follow up appointment scheduled with Jiles Crocker, NP next week. He understands what to expect with his PSA measures. Patient was also educated today about some of the long-term effects from radiation including a small risk for rectal bleeding and possibly erectile dysfunction. We talked about some of the general management approaches to these potential complications. However, I did encourage the patient to contact our office or return at any point if he has questions or concerns related to his previous radiation and prostate cancer.    [name], PA-C

## 2019-03-13 ENCOUNTER — Ambulatory Visit (INDEPENDENT_AMBULATORY_CARE_PROVIDER_SITE_OTHER): Payer: Medicare HMO | Admitting: Internal Medicine

## 2019-03-13 ENCOUNTER — Encounter: Payer: Self-pay | Admitting: Internal Medicine

## 2019-03-13 ENCOUNTER — Other Ambulatory Visit: Payer: Self-pay

## 2019-03-13 VITALS — BP 136/74 | HR 60 | Ht 68.0 in | Wt 258.0 lb

## 2019-03-13 DIAGNOSIS — I110 Hypertensive heart disease with heart failure: Secondary | ICD-10-CM | POA: Diagnosis not present

## 2019-03-13 DIAGNOSIS — I4430 Unspecified atrioventricular block: Secondary | ICD-10-CM

## 2019-03-13 DIAGNOSIS — I5030 Unspecified diastolic (congestive) heart failure: Secondary | ICD-10-CM | POA: Diagnosis not present

## 2019-03-13 NOTE — Patient Instructions (Signed)
Medication Instructions:  none If you need a refill on your cardiac medications before your next appointment, please call your pharmacy.   Lab work: none If you have labs (blood work) drawn today and your tests are completely normal, you will receive your results only by: . MyChart Message (if you have MyChart) OR . A paper copy in the mail If you have any lab test that is abnormal or we need to change your treatment, we will call you to review the results.  Testing/Procedures: none  Follow-Up: At [facility], you and your health needs are our priority.  As part of our continuing mission to provide you with exceptional heart care, we have created designated Provider Care Teams.  These Care Teams include your primary Cardiologist (physician) and Advanced Practice Providers (APPs -  Physician Assistants and Nurse Practitioners) who all work together to provide you with the care you need, when you need it. You will need a follow up appointment in 1 years.  Please call our office 2 months in advance to schedule this appointment.  You may see Dr Taylor or one of the following Advanced Practice Providers on your designated Care Team:   Amber Seiler, NP . Renee Ursuy, PA-C  Any Other Special Instructions Will Be Listed Below (If Applicable).    

## 2019-03-13 NOTE — Progress Notes (Signed)
HPI Marcus [name] returns today for followup of his CHB, s/p PPM insertion. He lost his wife of 61 years recently. He has prostate CA and is s/p XRT. He has preserved LV function. He has not had any problems with his PPM. He admits to some peripheral edema and this has worsened since his prostate treatment. He admits to dietary indiscretion.  Allergies  Allergen Reactions  . Contrast Media [Iodinated Diagnostic Agents] Palpitations    TACHYCARDIA- patient states he has tolerated newer agents since this reaction >30 yrs ago     Current Outpatient Medications  Medication Sig Dispense Refill  . allopurinol (ZYLOPRIM) 300 MG tablet TAKE 1 TABLET EVERY DAY 90 tablet 0  . amLODipine (NORVASC) 10 MG tablet TAKE 1 TABLET EVERY DAY 90 tablet 0  . Calcium Carb-Cholecalciferol (CALCIUM 600 + D PO) Take 1 tablet by mouth daily.    . cetirizine (ZYRTEC) 10 MG tablet Take 10 mg by mouth daily.    . Cholecalciferol (VITAMIN D) 2000 UNITS CAPS Take 1 capsule by mouth daily.    . Cyanocobalamin (VITAMIN B-12) 5000 MCG TBDP Take 1 tablet by mouth daily.     [name] Calcium (STOOL SOFTENER PO) Take 1 tablet by mouth daily.    . finasteride (PROSCAR) 5 MG tablet Take 5 mg by mouth daily.    . magnesium oxide (MAG-OX) 400 MG tablet Take 400 mg by mouth daily.    . Multiple Vitamins-Minerals (MULTIVITAMIN WITH MINERALS) tablet Take 1 tablet by mouth daily.    [name] Kitchen omeprazole (PRILOSEC) 40 MG capsule Take 1 capsule (40 mg total) by mouth daily. 90 capsule 1  . oxymetazoline (AFRIN) 0.05 % nasal spray Place 1 spray into both nostrils at bedtime.    . Potassium 99 MG TABS Take 99 mg by mouth daily.     . simvastatin (ZOCOR) 20 MG tablet Take 1 tablet (20 mg total) by mouth at bedtime. 90 tablet 1  . tamsulosin (FLOMAX) 0.4 MG CAPS capsule Take 0.4 mg by mouth daily.     . traZODone (DESYREL) 50 MG tablet Take 1 tablet (50 mg total) by mouth at bedtime. 90 tablet 0  . valsartan-hydrochlorothiazide  (DIOVAN-HCT) 320-25 MG tablet TAKE 1 TABLET EVERY DAY 90 tablet 0   No current facility-administered medications for this visit.      Past Medical History:  Diagnosis Date  . Acute meniscal tear of knee left  . Arthritis    generalized  . Calcaneal spur   . Colon cancer [facility]) 1983   surgical removed. did not receive radiation or chemotherapy.  . Coronary artery disease    dr Stanford Breed  . Cramps of lower extremity    in the mornings occ.  . Depression   . Diverticulosis of colon (without mention of hemorrhage)   . Echocardiogram findings abnormal, without diagnosis 04-23-2010   normal LV function, mod. left atrial enlargement, mild right artrial enlargement and grade 1 diastolic dysfunction  . Edema of lower extremity    ankles, elevates legs  . Elevated prostate specific antigen (PSA)   . Esophageal reflux   . External hemorrhoids   . Frequency of urination    followed by dr dalhstedt  . Gout   . Hiatal hernia   . History of colon cancer 1988  . Hyperlipidemia   . Lumbar spondylolysis   . Malaria 1964  . Normal nuclear stress test 04-23-2010   ef 51%,  septal hypokinesis, normal perfusion  . OSA (obstructive  sleep apnea)    does not use CPAP   . Paraesophageal hernia    large  . Personal history of colonic polyps 08/21/2009   TUBULAR ADENOMAS  . Pneumonia    history of; last episode 2015  . Postgastric surgery syndromes   . Presence of permanent cardiac pacemaker   . Prostate cancer ([place]) 2016  . Unspecified essential hypertension   . Unspecified vitamin D deficiency   . Ventral hernia   . Vitamin B12 deficiency     ROS:   All systems reviewed and negative except as noted in the HPI.   Past Surgical History:  Procedure Laterality Date  . CARDIAC CATHETERIZATION    . CATARACT EXTRACTION W/ INTRAOCULAR LENS  IMPLANT, BILATERAL Bilateral   . COLONOSCOPY    . ESOPHAGOGASTRODUODENOSCOPY    . KNEE ARTHROSCOPY Right 2007  . KNEE ARTHROSCOPY  07/13/2011    Procedure: ARTHROSCOPY KNEE;  Surgeon: Cynda Familia;  Location: [place];  Service: Orthopedics;  Laterality: Left;  partial menisectomy with chondrylplasty  . KNEE SURGERY Left   . LAMINECTOMY AND MICRODISCECTOMY LUMBAR SPINE  MARCH  2008   L3 -  4  . LAPAROSCOPIC INCISIONAL / UMBILICAL / VENTRAL HERNIA REPAIR  2006  . PACEMAKER IMPLANT N/A 08/02/2018   Procedure: PACEMAKER IMPLANT;  Surgeon: Evans Lance, MD;  Location: [facility];  Service: Cardiovascular;  Laterality: N/A;  . PROSTATE BIOPSY    . RADIOACTIVE SEED IMPLANT N/A 02/12/2019   Procedure: RADIOACTIVE SEED IMPLANT/BRACHYTHERAPY IMPLANT;  Surgeon: Franchot Gallo, MD;  Location: [facility];  Service: Urology;  Laterality: N/A;  90 MINS  . SHOULDER ARTHROSCOPY Right 05-23-2007  . SIGMOID COLECTOMY FOR CANCER  1989  . SPACE OAR INSTILLATION N/A 02/12/2019   Procedure: SPACE OAR INSTILLATION;  Surgeon: Franchot Gallo, MD;  Location: [facility];  Service: Urology;  Laterality: N/A;  . TOTAL KNEE ARTHROPLASTY Left 05/23/2015   Procedure: LEFT TOTAL KNEE ARTHROPLASTY;  Surgeon: Sydnee Cabal, MD;  Location: [facility];  Service: Orthopedics;  Laterality: Left;  [name] Kitchen VERTICAL BANDED GASTROPLASTY  1986     Family History  Problem Relation Age of Onset  . Stroke Paternal Grandfather   . Heart disease Paternal Grandfather   . Esophagitis Father        died from perforated esophagus  . Breast cancer Mother   . Colon cancer Maternal Grandmother        questionable     Social History   Socioeconomic History  . Marital status: Married    Spouse name: Not on file  . Number of children: 4  . Years of education: Not on file  . Highest education level: Not on file  Occupational History  . Occupation: retired    Fish farm manager: [place]  . Financial resource strain: Not on file  . Food insecurity    Worry: Not on file    Inability: Not on file  . Transportation needs    Medical:  Not on file    Non-medical: Not on file  Tobacco Use  . Smoking status: Former Smoker    Packs/day: 2.00    Years: 20.00    Pack years: 40.00    Types: Cigarettes    Quit date: 07/05/1974    Years since quitting: 44.7  . Smokeless tobacco: Never Used  Substance and Sexual Activity  . Alcohol use: Yes    Alcohol/week: 0.0 standard drinks    Comment: socially; also 1-2 glasses  of wine or beer every other day   . Drug use: No  . Sexual activity: Not Currently  Lifestyle  . Physical activity    Days per week: Not on file    Minutes per session: Not on file  . Stress: Not on file  Relationships  . Social Herbalist on phone: Not on file    Gets together: Not on file    Attends religious service: Not on file    Active member of club or organization: Not on file    Attends meetings of clubs or organizations: Not on file    Relationship status: Not on file  . Intimate partner violence    Fear of current or ex partner: Not on file    Emotionally abused: Not on file    Physically abused: Not on file    Forced sexual activity: Not on file  Other Topics Concern  . Not on file  Social History Narrative   Recently widowed   Former Smoker    Alcohol use-yes 1-2 drinks per day      Occupation: Retired Midwife      Originally from [place], [place] - in [place] > 10 yrs as of 2017        BP 136/74   Pulse 60   Ht 5\' 8"  (1.727 m)   Wt 258 lb (117 kg)   SpO2 98%   BMI 39.23 kg/m   Physical Exam:  Well appearing NAD HEENT: Unremarkable Neck:  No JVD, no thyromegally Lymphatics:  No adenopathy Back:  No CVA tenderness Lungs:  Clear with no wheezes HEART:  Regular rate rhythm, no murmurs, no rubs, no clicks Abd:  soft, positive bowel sounds, no organomegally, no rebound, no guarding Ext:  2 plus pulses, no edema, no cyanosis, no clubbing Skin:  No rashes no nodules Neuro:  CN II through XII intact, motor grossly intact  EKG - NSR with first degree AV block and  ventricular pacing  DEVICE  Normal device function.  See PaceArt for details.   Assess/Plan: 1. CHB- he is asymptomatic, s/p PPM insertion. 2. PPM - his [employer] DDD PM is working [employer]. We will recheck in several months.  3. HTN - he is encouraged to lose weight and to maintain a low sodium diet. 4. Diastolic heart failure - I have asked him reduce his salt intake and increase his activity.   Mikle Bosworth.D.

## 2019-03-14 ENCOUNTER — Encounter: Payer: Self-pay | Admitting: Radiation Oncology

## 2019-03-14 DIAGNOSIS — C61 Malignant neoplasm of prostate: Secondary | ICD-10-CM | POA: Diagnosis not present

## 2019-03-14 NOTE — Progress Notes (Signed)
  Radiation Oncology         ([phone]) [phone] ________________________________  Name: REMUS MOGA MRN: [id number]  Date: 03/14/2019  DOB: 04-29-1941  3D Planning Note   Prostate Brachytherapy Post-Implant Dosimetry  Diagnosis: 78 y.o. gentleman with Stage T1c adenocarcinoma of the prostate with Gleason score of 3+4, and PSA of 9.03  Narrative: On a previous date, NEAL FOERST returned following prostate seed implantation for post implant planning. He underwent CT scan complex simulation to delineate the three-dimensional structures of the pelvis and demonstrate the radiation distribution.  Since that time, the seed localization, and complex isodose planning with dose volume histograms have now been completed.  Results:   Prostate Coverage - The dose of radiation delivered to the 90% or more of the prostate gland (D90) was 97.53% of the prescription dose. This exceeds our goal of greater than 90%. Rectal Sparing - The volume of rectal tissue receiving the prescription dose or higher was 0.0 cc. This falls under our thresholds tolerance of 1.0 cc.  Impression: The prostate seed implant appears to show adequate target coverage and appropriate rectal sparing.  Plan:  The patient will continue to follow with urology for ongoing PSA determinations. I would anticipate a high likelihood for local tumor control with minimal risk for rectal morbidity.  ________________________________  Sheral Apley Tammi Klippel, M.D.

## 2019-03-15 DIAGNOSIS — R351 Nocturia: Secondary | ICD-10-CM | POA: Diagnosis not present

## 2019-03-15 DIAGNOSIS — C61 Malignant neoplasm of prostate: Secondary | ICD-10-CM | POA: Diagnosis not present

## 2019-03-15 DIAGNOSIS — R3912 Poor urinary stream: Secondary | ICD-10-CM | POA: Diagnosis not present

## 2019-03-15 DIAGNOSIS — R3916 Straining to void: Secondary | ICD-10-CM | POA: Diagnosis not present

## 2019-03-16 ENCOUNTER — Other Ambulatory Visit: Payer: Self-pay | Admitting: Adult Health

## 2019-03-16 ENCOUNTER — Ambulatory Visit: Payer: Self-pay | Admitting: *Deleted

## 2019-03-16 ENCOUNTER — Telehealth: Payer: Self-pay | Admitting: Adult Health

## 2019-03-16 MED ORDER — TEMAZEPAM 15 MG PO CAPS: 15.0000 mg | ORAL_CAPSULE | Freq: Every evening | ORAL | 0 refills | Status: DC | PRN

## 2019-03-16 NOTE — Telephone Encounter (Signed)
error 

## 2019-03-16 NOTE — Telephone Encounter (Signed)
Pt notified to stop trazodone and pick up new rx from [facility] [address].  Nothing further needed.

## 2019-03-16 NOTE — Telephone Encounter (Signed)
Ok to stop Trazodone. I have sent in Restoril

## 2019-03-16 NOTE — Telephone Encounter (Signed)
Summary: New Medication/Diarrhea   Pt stated he started Trazodone last week and has had sever diarrhea. He would like to know what to do about this. Please advise.      Patient is calling to report he has had diarrhea as side effect to new medication given for sleep- Trazodone. Patient states he has tried stopping and restarting the medication and ha has diarrhea every time.  Patient reports when he has the diarrhea it can be uncontrollable. Patient is not sleeping well and is requesting a different medication. Patient advised I would let provider know and see if there is another medication he can try.  Reason for Disposition . [1] Caller has URGENT medication question about med that PCP or specialist prescribed AND [2] triager unable to answer question  Answer Assessment - Initial Assessment Questions 1. DIARRHEA SEVERITY: "How bad is the diarrhea?" "How many extra stools have you had in the past 24 hours than normal?"    - NO DIARRHEA (SCALE 0)   - MILD (SCALE 1-3): Few loose or mushy BMs; increase of 1-3 stools over normal daily number of stools; mild increase in ostomy output.   -  MODERATE (SCALE 4-7): Increase of 4-6 stools daily over normal; moderate increase in ostomy output. * SEVERE (SCALE 8-10; OR 'WORST POSSIBLE'): Increase of 7 or more stools daily over normal; moderate increase in ostomy output; incontinence.     severe 2. ONSET: "When did the diarrhea begin?"      Occurring within 6-8 hours after taking medication 3. BM CONSISTENCY: "How loose or watery is the diarrhea?"      No control- loose and watery- lasting 1 1/2 days 4. VOMITING: "Are you also vomiting?" If so, ask: "How many times in the past 24 hours?"      no 5. ABDOMINAL PAIN: "Are you having any abdominal pain?" If yes: "What does it feel like?" (e.g., crampy, dull, intermittent, constant)      no 6. ABDOMINAL PAIN SEVERITY: If present, ask: "How bad is the pain?"  (e.g., Scale 1-10; mild, moderate, or severe)   -  MILD (1-3): doesn't interfere with normal activities, abdomen soft and not tender to touch    - MODERATE (4-7): interferes with normal activities or awakens from sleep, tender to touch    - SEVERE (8-10): excruciating pain, doubled over, unable to do any normal activities       n/a 7. ORAL INTAKE: If vomiting, "Have you been able to drink liquids?" "How much fluids have you had in the past 24 hours?"     n/a 8. HYDRATION: "Any signs of dehydration?" (e.g., dry mouth [not just dry lips], too weak to stand, dizziness, new weight loss) "When did you last urinate?"     No signs of dehydration 9. EXPOSURE: "Have you traveled to a foreign country recently?" "Have you been exposed to anyone with diarrhea?" "Could you have eaten any food that was spoiled?"     no 10. ANTIBIOTIC USE: "Are you taking antibiotics now or have you taken antibiotics in the past 2 months?"       no 11. OTHER SYMPTOMS: "Do you have any other symptoms?" (e.g., fever, blood in stool)       no 12. PREGNANCY: "Is there any chance you are pregnant?" "When was your last menstrual period?"       n/a  Answer Assessment - Initial Assessment Questions 1.   NAME of MEDICATION: "What medicine are you calling about?"     Trazodone  2.   QUESTION: "What is your question?"     Patient is having side effect 3.   PRESCRIBING HCP: "Who prescribed it?" Reason: if prescribed by specialist, call should be referred to that group.     PCP 4. SYMPTOMS: "Do you have any symptoms?"     diarrhea 5. SEVERITY: If symptoms are present, ask "Are they mild, moderate or severe?"     Severe and uncontrollable at times- patient has stopped the medication and restarted to make sure it is the medication causing the symptom and he states it is. 6.  PREGNANCY:  "Is there any chance that you are pregnant?" "When was your last menstrual period?"     n/a  Protocols used: MEDICATION QUESTION CALL-A-AH, DIARRHEA-A-AH

## 2019-03-26 ENCOUNTER — Other Ambulatory Visit: Payer: Self-pay | Admitting: Adult Health

## 2019-03-26 DIAGNOSIS — I10 Essential (primary) hypertension: Secondary | ICD-10-CM

## 2019-04-06 DIAGNOSIS — G4733 Obstructive sleep apnea (adult) (pediatric): Secondary | ICD-10-CM | POA: Diagnosis not present

## 2019-04-17 ENCOUNTER — Other Ambulatory Visit: Payer: Self-pay

## 2019-04-17 ENCOUNTER — Telehealth: Payer: Self-pay | Admitting: Adult Health

## 2019-04-17 ENCOUNTER — Ambulatory Visit (INDEPENDENT_AMBULATORY_CARE_PROVIDER_SITE_OTHER): Payer: Medicare HMO | Admitting: Adult Health

## 2019-04-17 ENCOUNTER — Encounter: Payer: Self-pay | Admitting: Adult Health

## 2019-04-17 DIAGNOSIS — F332 Major depressive disorder, recurrent severe without psychotic features: Secondary | ICD-10-CM

## 2019-04-17 MED ORDER — BUPROPION HCL ER (SR) 150 MG PO TB12: 150.0000 mg | ORAL_TABLET | Freq: Two times a day (BID) | ORAL | 1 refills | Status: DC

## 2019-04-17 NOTE — Telephone Encounter (Signed)
Spoke directly to the Marcus [name] and advised that he have a follow up with [facility].  Marcus [name] agreed and placed on the schedule.  Called Mickel Baas and informed her that I do not have a DPR on file for her but I can tell her that Marcus [name] has an appt today with Tommi Rumps.  She did want me to inform Tommi Rumps that she has noticed a decline in the Marcus [name]'s hearing.  Would like to see if Tommi Rumps notices a decline today during telephone call.  Nothing further needed.

## 2019-04-17 NOTE — Progress Notes (Signed)
Virtual Visit via Telephone Note  I connected with Marcus [name] on 04/17/19 at  3:30 PM EDT by telephone and verified that I am speaking with the correct person using two identifiers.   I discussed the limitations, risks, security and privacy concerns of performing an evaluation and management service by telephone and the availability of in person appointments. I also discussed with the patient that there may be a patient responsible charge related to this service. The patient expressed understanding and agreed to proceed.  Location patient: home Location provider: work or home office Participants present for the call: patient, provider Patient did not have a visit in the prior 7 days to address this/these issue(s).   History of Present Illness: 78 year old male who is being evaluated today for follow-up regarding depression.  His daughter called the office today because she is concerned for her dad's wellbeing.  Over the summer he had his wife of 25 years passed away from complications from Alzheimer's disease.  Patient reports today that "I am in the deepest depression of ever been in my life.  I am living my life on memories.  In some parts of the day I just look at pictures and then other parts I just stare into the sky".  He denies suicidal ideation but states "some days I just do not want to be here anymore".  He denies having a plan.  Is currently prescribed restoral for insomnia but this is complicated by having to get up to use the restroom multiple times a night-he is going to radiation therapy for prostate cancer.  In the past he has been on Wellbutrin, Celexa, Prozac, and trazodone.  He believes Wellbutrin worked best for him in the past   Observations/Objective: Patient sounds cheerful and well on the phone. I do not appreciate any SOB. Speech and thought processing are grossly intact. Patient reported vitals:  Assessment and Plan: 1. Severe episode of recurrent major  depressive disorder, without psychotic features ([place]) -We will restart on Wellbutrin 150 mg twice daily.  He was advised to follow-up in 30 days or sooner if needed.  If he has any suicidal thoughts then he needs to stop the medication and go to the emergency room. - buPROPion (WELLBUTRIN SR) 150 MG 12 hr tablet; Take 1 tablet (150 mg total) by mouth 2 (two) times daily.  Dispense: 60 tablet; Refill: 1   Follow Up Instructions:   I did not refer this patient for an OV in the next 24 hours for this/these issue(s).  I discussed the assessment and treatment plan with the patient. The patient was provided an opportunity to ask questions and all were answered. The patient agreed with the plan and demonstrated an understanding of the instructions.   The patient was advised to call back or seek an in-person evaluation if the symptoms worsen or if the condition fails to improve as anticipated.  I provided 23 minutes of non-face-to-face time during this encounter.   Dorothyann Peng, NP

## 2019-04-17 NOTE — Telephone Encounter (Signed)
Pt's daughter, Mickel Baas, calling.  States that she needs to make pt's PCP aware that pt is very depressed and she needs to know what to do.  They can not get pt to make an appointment and would like to know what PCP recommends that they do for pt.

## 2019-04-25 ENCOUNTER — Telehealth: Payer: Self-pay

## 2019-04-25 DIAGNOSIS — Z7189 Other specified counseling: Secondary | ICD-10-CM

## 2019-04-25 NOTE — Telephone Encounter (Signed)
Copied from [place] [id number]. Topic: Referral - Request for Referral >> Apr 25, 2019  1:03 PM Lennox Solders wrote: Has patient seen PCP for this complaint no . Pt needs audiologist evaluation for new hearing aid. Pt has [employer]

## 2019-04-25 NOTE — Telephone Encounter (Signed)
Referral placed.  Nothing further needed.  

## 2019-04-26 ENCOUNTER — Other Ambulatory Visit: Payer: Self-pay

## 2019-04-26 DIAGNOSIS — Z20822 Contact with and (suspected) exposure to covid-19: Secondary | ICD-10-CM

## 2019-04-28 LAB — NOVEL CORONAVIRUS, NAA: SARS-CoV-2, NAA: NOT DETECTED

## 2019-05-07 DIAGNOSIS — G4733 Obstructive sleep apnea (adult) (pediatric): Secondary | ICD-10-CM | POA: Diagnosis not present

## 2019-05-10 DIAGNOSIS — C61 Malignant neoplasm of prostate: Secondary | ICD-10-CM | POA: Diagnosis not present

## 2019-05-11 ENCOUNTER — Other Ambulatory Visit: Payer: Self-pay | Admitting: Adult Health

## 2019-05-11 DIAGNOSIS — F332 Major depressive disorder, recurrent severe without psychotic features: Secondary | ICD-10-CM

## 2019-05-11 NOTE — Telephone Encounter (Signed)
DENIED.  FILLED ON 04/17/2019 FOR 2 MONTHS.  REFILL REQUEST IS TOO EARLY.

## 2019-05-14 LAB — PSA: PSA: <0.015

## 2019-05-15 ENCOUNTER — Ambulatory Visit: Payer: Self-pay | Admitting: *Deleted

## 2019-05-15 NOTE — Telephone Encounter (Signed)
Patient is calling to report dizziness and unstable on feet since Monday. Patient reports he is not hydrating the way he should, denies fever, chest pain, weakness. Call to office- PCP is not in. They are requesting call for review and scheduling with another provider. Patient notified and he declines another provider- he has appointment in 2 days and states he will keep that. Patient advised to be very careful of falling, call back if gets worse and we will be glad to see him. Reason for Disposition . [1] MODERATE dizziness (e.g., interferes with normal activities) AND [2] has NOT been evaluated by physician for this  (Exception: dizziness caused by heat exposure, sudden standing, or poor fluid intake)  Answer Assessment - Initial Assessment Questions 1. DESCRIPTION: "Describe your dizziness."     light headed, unstable 2. LIGHTHEADED: "Do you feel lightheaded?" (e.g., somewhat faint, woozy, weak upon standing)     lightheaded 3. VERTIGO: "Do you feel like either you or the room is spinning or tilting?" (i.e. vertigo)     dizzy 4. SEVERITY: "How bad is it?"  "Do you feel like you are going to faint?" "Can you stand and walk?"   - MILD - walking normally   - MODERATE - interferes with normal activities (e.g., work, school)    - SEVERE - unable to stand, requires support to walk, feels like passing out now.      Moderate/severe 5. ONSET:  "When did the dizziness begin?"     Monday 6. AGGRAVATING FACTORS: "Does anything make it worse?" (e.g., standing, change in head position)     No- seems to better when sitting 7. HEART RATE: "Can you tell me your heart rate?" "How many beats in 15 seconds?"  (Note: not all patients can do this)       Can not measure 8. CAUSE: "What do you think is causing the dizziness?"     Wax in R ear 9. RECURRENT SYMPTOM: "Have you had dizziness before?" If so, ask: "When was the last time?" "What happened that time?"     Not in recent mememoy 10. OTHER SYMPTOMS: "Do  you have any other symptoms?" (e.g., fever, chest pain, vomiting, diarrhea, bleeding)       Hot flashes- patient has had before 11. PREGNANCY: "Is there any chance you are pregnant?" "When was your last menstrual period?"       n/a  Protocols used: DIZZINESS [facility]

## 2019-05-16 DIAGNOSIS — R350 Frequency of micturition: Secondary | ICD-10-CM | POA: Diagnosis not present

## 2019-05-16 DIAGNOSIS — C61 Malignant neoplasm of prostate: Secondary | ICD-10-CM | POA: Diagnosis not present

## 2019-05-16 DIAGNOSIS — R351 Nocturia: Secondary | ICD-10-CM | POA: Diagnosis not present

## 2019-05-16 DIAGNOSIS — N3281 Overactive bladder: Secondary | ICD-10-CM | POA: Diagnosis not present

## 2019-05-18 ENCOUNTER — Other Ambulatory Visit: Payer: Self-pay

## 2019-05-18 ENCOUNTER — Ambulatory Visit (INDEPENDENT_AMBULATORY_CARE_PROVIDER_SITE_OTHER): Payer: Medicare HMO | Admitting: Adult Health

## 2019-05-18 ENCOUNTER — Encounter: Payer: Self-pay | Admitting: Adult Health

## 2019-05-18 VITALS — BP 150/80 | Temp 96.5°F | Wt 260.0 lb

## 2019-05-18 DIAGNOSIS — F339 Major depressive disorder, recurrent, unspecified: Secondary | ICD-10-CM | POA: Diagnosis not present

## 2019-05-18 DIAGNOSIS — H6121 Impacted cerumen, right ear: Secondary | ICD-10-CM | POA: Diagnosis not present

## 2019-05-18 MED ORDER — BUPROPION HCL ER (XL) 150 MG PO TB24: 150.0000 mg | ORAL_TABLET | Freq: Every day | ORAL | 1 refills | Status: DC

## 2019-05-18 NOTE — Progress Notes (Signed)
Subjective:    Patient ID: Marcus [name], male    DOB: 04/01/1941, 78 y.o.   MRN: [id number]  HPI 78 year old male who  has a past medical history of Acute meniscal tear of knee (left), Arthritis, Calcaneal spur, Colon cancer ([place]) (1983), Coronary artery disease, Cramps of lower extremity, Depression, Diverticulosis of colon (without mention of hemorrhage), Echocardiogram findings abnormal, without diagnosis (04-23-2010), Edema of lower extremity, Elevated prostate specific antigen (PSA), Esophageal reflux, External hemorrhoids, Frequency of urination, Gout, Hiatal hernia, History of colon cancer (1988), Hyperlipidemia, Lumbar spondylolysis, Malaria (1964), Normal nuclear stress test (04-23-2010), OSA (obstructive sleep apnea), Paraesophageal hernia, Personal history of colonic polyps (08/21/2009), Pneumonia, Postgastric surgery syndromes, Presence of permanent cardiac pacemaker, Prostate cancer ([place]) (2016), Unspecified essential hypertension, Unspecified vitamin D deficiency, Ventral hernia, and Vitamin B12 deficiency.  He presents to the office today for follow up regarding depression and for an acute issue of feeling as though his ears are clogged.   One month ago he was started on Wellbutrin for depression. Over the summer his wife of 65 years had passed away from complications of Alzheimers disease. He reported being in the deepest depression of his life. At this time he denied SI.  Today he reports that he feels as though he is doing better but often forgets to take his evening dose. He also reports that he has been having issues with dizziness since starting this medication. The dizziness has not caused any falls and it is not enough to make him stop the medication   Additionally, he needs to have his right ear irrigated to remove ear wax. He went for hearing aid evaluation yesterday and was told that he needs to have this done prior to evaluation. He does report HOH but this is chronic.    BP Readings from Last 3 Encounters:  05/18/19 (!) 150/80  03/13/19 136/74  03/09/19 136/66   Review of Systems See HPI   Past Medical History:  Diagnosis Date  . Acute meniscal tear of knee left  . Arthritis    generalized  . Calcaneal spur   . Colon cancer [facility]) 1983   surgical removed. did not receive radiation or chemotherapy.  . Coronary artery disease    dr Stanford Breed  . Cramps of lower extremity    in the mornings occ.  . Depression   . Diverticulosis of colon (without mention of hemorrhage)   . Echocardiogram findings abnormal, without diagnosis 04-23-2010   normal LV function, mod. left atrial enlargement, mild right artrial enlargement and grade 1 diastolic dysfunction  . Edema of lower extremity    ankles, elevates legs  . Elevated prostate specific antigen (PSA)   . Esophageal reflux   . External hemorrhoids   . Frequency of urination    followed by dr dalhstedt  . Gout   . Hiatal hernia   . History of colon cancer 1988  . Hyperlipidemia   . Lumbar spondylolysis   . Malaria 1964  . Normal nuclear stress test 04-23-2010   ef 51%,  septal hypokinesis, normal perfusion  . OSA (obstructive sleep apnea)    does not use CPAP   . Paraesophageal hernia    large  . Personal history of colonic polyps 08/21/2009   TUBULAR ADENOMAS  . Pneumonia    history of; last episode 2015  . Postgastric surgery syndromes   . Presence of permanent cardiac pacemaker   . Prostate cancer ([place]) 2016  . Unspecified essential hypertension   .  Unspecified vitamin D deficiency   . Ventral hernia   . Vitamin B12 deficiency     Social History   Socioeconomic History  . Marital status: Married    Spouse name: Not on file  . Number of children: 4  . Years of education: Not on file  . Highest education level: Not on file  Occupational History  . Occupation: retired    Fish farm manager: [employer]  . Financial resource strain: Not on file  . Food insecurity     Worry: Not on file    Inability: Not on file  . Transportation needs    Medical: Not on file    Non-medical: Not on file  Tobacco Use  . Smoking status: Former Smoker    Packs/day: 2.00    Years: 20.00    Pack years: 40.00    Types: Cigarettes    Quit date: 07/05/1974    Years since quitting: 44.8  . Smokeless tobacco: Never Used  Substance and Sexual Activity  . Alcohol use: Yes    Alcohol/week: 0.0 standard drinks    Comment: socially; also 1-2 glasses of wine or beer every other day   . Drug use: No  . Sexual activity: Not Currently  Lifestyle  . Physical activity    Days per week: Not on file    Minutes per session: Not on file  . Stress: Not on file  Relationships  . Social Herbalist on phone: Not on file    Gets together: Not on file    Attends religious service: Not on file    Active member of club or organization: Not on file    Attends meetings of clubs or organizations: Not on file    Relationship status: Not on file  . Intimate partner violence    Fear of current or ex partner: Not on file    Emotionally abused: Not on file    Physically abused: Not on file    Forced sexual activity: Not on file  Other Topics Concern  . Not on file  Social History Narrative   Recently widowed   Former Smoker    Alcohol use-yes 1-2 drinks per day      Occupation: Retired Midwife      Originally from [place], [place] - in [place] > 10 yrs as of 2017       Past Surgical History:  Procedure Laterality Date  . CARDIAC CATHETERIZATION    . CATARACT EXTRACTION W/ INTRAOCULAR LENS  IMPLANT, BILATERAL Bilateral   . COLONOSCOPY    . ESOPHAGOGASTRODUODENOSCOPY    . KNEE ARTHROSCOPY Right 2007  . KNEE ARTHROSCOPY  07/13/2011   Procedure: ARTHROSCOPY KNEE;  Surgeon: Cynda Familia;  Location: [place];  Service: Orthopedics;  Laterality: Left;  partial menisectomy with chondrylplasty  . KNEE SURGERY Left   . LAMINECTOMY AND MICRODISCECTOMY LUMBAR  SPINE  MARCH  2008   L3 -  4  . LAPAROSCOPIC INCISIONAL / UMBILICAL / VENTRAL HERNIA REPAIR  2006  . PACEMAKER IMPLANT N/A 08/02/2018   Procedure: PACEMAKER IMPLANT;  Surgeon: Evans Lance, MD;  Location: [place] [facility];  Service: Cardiovascular;  Laterality: N/A;  . PROSTATE BIOPSY    . RADIOACTIVE SEED IMPLANT N/A 02/12/2019   Procedure: RADIOACTIVE SEED IMPLANT/BRACHYTHERAPY IMPLANT;  Surgeon: Franchot Gallo, MD;  Location: [facility];  Service: Urology;  Laterality: N/A;  90 MINS  . SHOULDER ARTHROSCOPY Right 05-23-2007  . SIGMOID  COLECTOMY FOR CANCER  1989  . SPACE OAR INSTILLATION N/A 02/12/2019   Procedure: SPACE OAR INSTILLATION;  Surgeon: Franchot Gallo, MD;  Location: [facility];  Service: Urology;  Laterality: N/A;  . TOTAL KNEE ARTHROPLASTY Left 05/23/2015   Procedure: LEFT TOTAL KNEE ARTHROPLASTY;  Surgeon: Sydnee Cabal, MD;  Location: [facility];  Service: Orthopedics;  Laterality: Left;  [name] Kitchen VERTICAL BANDED GASTROPLASTY  1986    Family History  Problem Relation Age of Onset  . Stroke Paternal Grandfather   . Heart disease Paternal Grandfather   . Esophagitis Father        died from perforated esophagus  . Breast cancer Mother   . Colon cancer Maternal Grandmother        questionable    Allergies  Allergen Reactions  . Contrast Media [Iodinated Diagnostic Agents] Palpitations    TACHYCARDIA- patient states he has tolerated newer agents since this reaction >30 yrs ago    Current Outpatient Medications on File Prior to Visit  Medication Sig Dispense Refill  . allopurinol (ZYLOPRIM) 300 MG tablet TAKE 1 TABLET EVERY DAY 90 tablet 1  . amLODipine (NORVASC) 10 MG tablet TAKE 1 TABLET EVERY DAY 90 tablet 1  . Calcium Carb-Cholecalciferol (CALCIUM 600 + D PO) Take 1 tablet by mouth daily.    . cetirizine (ZYRTEC) 10 MG tablet Take 10 mg by mouth daily.    . Cholecalciferol (VITAMIN D) 2000 UNITS CAPS Take 1 capsule by mouth daily.    . Cyanocobalamin (VITAMIN B-12)  5000 MCG TBDP Take 1 tablet by mouth daily.     [name] Calcium (STOOL SOFTENER PO) Take 1 tablet by mouth daily.    . finasteride (PROSCAR) 5 MG tablet Take 5 mg by mouth daily.    . magnesium oxide (MAG-OX) 400 MG tablet Take 400 mg by mouth daily.    . Multiple Vitamins-Minerals (MULTIVITAMIN WITH MINERALS) tablet Take 1 tablet by mouth daily.    [name] Kitchen omeprazole (PRILOSEC) 40 MG capsule Take 1 capsule (40 mg total) by mouth daily. 90 capsule 1  . oxymetazoline (AFRIN) 0.05 % nasal spray Place 1 spray into both nostrils at bedtime.    . Potassium 99 MG TABS Take 99 mg by mouth daily.     . simvastatin (ZOCOR) 20 MG tablet Take 1 tablet (20 mg total) by mouth at bedtime. 90 tablet 1  . tamsulosin (FLOMAX) 0.4 MG CAPS capsule Take 0.4 mg by mouth daily.     . temazepam (RESTORIL) 15 MG capsule Take 1 capsule (15 mg total) by mouth at bedtime as needed for sleep. 30 capsule 0  . valsartan-hydrochlorothiazide (DIOVAN-HCT) 320-25 MG tablet TAKE 1 TABLET EVERY DAY 90 tablet 1  . buPROPion (WELLBUTRIN SR) 150 MG 12 hr tablet Take 1 tablet (150 mg total) by mouth 2 (two) times daily. 60 tablet 1   No current facility-administered medications on file prior to visit.     BP (!) 150/80   Temp (!) 96.5 F (35.8 C)   Wt 260 lb (117.9 kg)   BMI 39.53 kg/m       Objective:   Physical Exam Vitals signs and nursing note reviewed.  Constitutional:      Appearance: Normal appearance.  HENT:     Right Ear: External ear normal.     Left Ear: Tympanic membrane, ear canal and external ear normal. There is no impacted cerumen.  Cardiovascular:     Rate and Rhythm: Normal rate and regular rhythm.  Pulses: Normal pulses.     Heart sounds: Normal heart sounds.  Pulmonary:     Effort: Pulmonary effort is normal.     Breath sounds: Normal breath sounds.  Abdominal:     General: Abdomen is flat.     Palpations: Abdomen is soft.  Musculoskeletal: Normal range of motion.  Skin:    General: Skin is  warm and dry.     Capillary Refill: Capillary refill takes less than 2 seconds.  Neurological:     Mental Status: He is alert.       Assessment & Plan:  1. Depression, recurrent ([place]) - Will switch to extended release to help with compliance and see if the dizziness improves.  - buPROPion (WELLBUTRIN XL) 150 MG 24 hr tablet; Take 1 tablet (150 mg total) by mouth daily.  Dispense: 90 tablet; Refill: 1  2. Impacted cerumen of right ear Warm water was applied and gentle ear lavage performed on the right. There were no complications and following the disimpaction the tympanic membrane were visible on the right.  Tympanic membranes are intact following the procedure.  Auditory canals are normal.  The patient reported relief of symptoms after removal of cerumen.Patient tolerated procedure well.   Dorothyann Peng, NP

## 2019-05-21 ENCOUNTER — Ambulatory Visit (INDEPENDENT_AMBULATORY_CARE_PROVIDER_SITE_OTHER): Payer: Medicare HMO | Admitting: *Deleted

## 2019-05-21 DIAGNOSIS — I459 Conduction disorder, unspecified: Secondary | ICD-10-CM | POA: Diagnosis not present

## 2019-05-21 DIAGNOSIS — I4430 Unspecified atrioventricular block: Secondary | ICD-10-CM

## 2019-05-22 ENCOUNTER — Telehealth: Payer: Self-pay | Admitting: *Deleted

## 2019-05-22 LAB — CUP PACEART REMOTE DEVICE CHECK
Date Time Interrogation Session: 20201117092041
Implantable Lead Implant Date: 20200129
Implantable Lead Implant Date: 20200129
Implantable Lead Location: 753859
Implantable Lead Location: 753860
Implantable Lead Model: 377
Implantable Lead Model: 377
Implantable Lead Serial Number: 80947537
Implantable Lead Serial Number: 80970966
Implantable Pulse Generator Implant Date: 20200129
Pulse Gen Model: 407145
Pulse Gen Serial Number: 69503797

## 2019-05-22 NOTE — Telephone Encounter (Signed)
Copied from [place] [id number]. Topic: General - Other >> May 22, 2019 11:06 AM Reyne Dumas L wrote: Reason for CRM:   Pt states he had a private meeting scheduled with Tommi Rumps for Saturday and he will not be able to make that meeting.  Pt wants a confirmation from [facility] that he has received this message. Pt can be reached at [phone].

## 2019-06-06 ENCOUNTER — Encounter: Payer: Self-pay | Admitting: Family Medicine

## 2019-06-06 DIAGNOSIS — G4733 Obstructive sleep apnea (adult) (pediatric): Secondary | ICD-10-CM | POA: Diagnosis not present

## 2019-06-11 ENCOUNTER — Telehealth: Payer: Self-pay | Admitting: Internal Medicine

## 2019-06-11 NOTE — Telephone Encounter (Signed)
Patient's 06/11/19 transmission showed no events, device function WNL.Denies CP, SOB, dizziness or syncope. Patient is only sleeping 5 hours /night. Suggested he contact PCP with concerns about difficulty sleeping.

## 2019-06-11 NOTE — Telephone Encounter (Signed)
New Message    Pt says he is having some concerns because he falls asleep in the middle of church and yesterday while watching the game. He says he doesn't know why he keeps falling asleep. He says he falls asleep for maybe 5 mins at a time and doesn't know if he needs to have his device checked or if he needs to send a transmission    Please call

## 2019-06-15 NOTE — Progress Notes (Signed)
Remote pacemaker transmission.   

## 2019-06-18 DIAGNOSIS — C61 Malignant neoplasm of prostate: Secondary | ICD-10-CM | POA: Diagnosis not present

## 2019-06-26 ENCOUNTER — Other Ambulatory Visit: Payer: Self-pay | Admitting: Adult Health

## 2019-06-26 DIAGNOSIS — F5104 Psychophysiologic insomnia: Secondary | ICD-10-CM

## 2019-07-03 ENCOUNTER — Ambulatory Visit: Payer: Medicare HMO | Attending: Internal Medicine

## 2019-07-03 DIAGNOSIS — Z20822 Contact with and (suspected) exposure to covid-19: Secondary | ICD-10-CM

## 2019-07-03 DIAGNOSIS — Z20828 Contact with and (suspected) exposure to other viral communicable diseases: Secondary | ICD-10-CM | POA: Diagnosis not present

## 2019-07-04 ENCOUNTER — Telehealth: Payer: Self-pay | Admitting: *Deleted

## 2019-07-04 LAB — NOVEL CORONAVIRUS, NAA: SARS-CoV-2, NAA: NOT DETECTED

## 2019-07-04 NOTE — Telephone Encounter (Signed)
Patient called for results .still pending . °

## 2019-07-05 ENCOUNTER — Telehealth: Payer: Self-pay

## 2019-07-05 NOTE — Telephone Encounter (Signed)
Caller given negative result and verbalized understanding  

## 2019-07-07 DIAGNOSIS — G4733 Obstructive sleep apnea (adult) (pediatric): Secondary | ICD-10-CM | POA: Diagnosis not present

## 2019-07-12 ENCOUNTER — Telehealth: Payer: Self-pay | Admitting: *Deleted

## 2019-07-12 NOTE — Telephone Encounter (Signed)
Copied from [place] [id number]. Topic: General - Other >> Jul 12, 2019 11:48 AM Rayann Heman wrote: Reason for CRM: pt called and stated that he emailed cory about some medical issue. Pt would like a call back from cory if possible. Please advise

## 2019-07-13 ENCOUNTER — Telehealth: Payer: Self-pay | Admitting: *Deleted

## 2019-07-13 ENCOUNTER — Other Ambulatory Visit: Payer: Self-pay | Admitting: Adult Health

## 2019-07-13 MED ORDER — AMOXICILLIN-POT CLAVULANATE 875-125 MG PO TABS: 1.0000 | ORAL_TABLET | Freq: Two times a day (BID) | ORAL | 0 refills | Status: DC

## 2019-07-13 NOTE — Telephone Encounter (Signed)
Spoke to patient and sent in antibiotics.   Can we get him scheduled next week for an in person visit to go over his medications

## 2019-07-13 NOTE — Telephone Encounter (Signed)
Pt has been scheduled.  °

## 2019-07-13 NOTE — Telephone Encounter (Signed)
Pt stated that he sent an e-mail to PCP personal e-mail. Pt stated that he wants to see a specialist for breathing and chest issues. Pt stated that his test came back negative for Covid but is still having a lot of "thick mucus". Pt also stated that he has a lot of duplicated medications and stated that he doesn't know what he is doing.  Pt would like a call back from [place].

## 2019-07-13 NOTE — Telephone Encounter (Signed)
Copied from [place] [phone]. Topic: General - Other >> Jul 13, 2019  9:04 AM Burchel, Abbi R wrote: Reason for CRM: Pt requesting call back from [facility].  Please call pt: [phone]

## 2019-07-19 ENCOUNTER — Other Ambulatory Visit: Payer: Self-pay

## 2019-07-19 DIAGNOSIS — Z0100 Encounter for examination of eyes and vision without abnormal findings: Secondary | ICD-10-CM | POA: Diagnosis not present

## 2019-07-19 DIAGNOSIS — I10 Essential (primary) hypertension: Secondary | ICD-10-CM | POA: Diagnosis not present

## 2019-07-19 DIAGNOSIS — H524 Presbyopia: Secondary | ICD-10-CM | POA: Diagnosis not present

## 2019-07-20 ENCOUNTER — Ambulatory Visit: Payer: Medicare HMO | Admitting: Adult Health

## 2019-07-24 ENCOUNTER — Ambulatory Visit (INDEPENDENT_AMBULATORY_CARE_PROVIDER_SITE_OTHER): Payer: Medicare HMO | Admitting: Adult Health

## 2019-07-24 ENCOUNTER — Other Ambulatory Visit: Payer: Self-pay

## 2019-07-24 ENCOUNTER — Encounter: Payer: Self-pay | Admitting: Adult Health

## 2019-07-24 DIAGNOSIS — Z79899 Other long term (current) drug therapy: Secondary | ICD-10-CM

## 2019-07-24 DIAGNOSIS — J988 Other specified respiratory disorders: Secondary | ICD-10-CM | POA: Diagnosis not present

## 2019-07-24 NOTE — Progress Notes (Signed)
Virtual Visit via Telephone Note  I connected with Marcus [name] on 07/24/19 at  1:30 PM EST by telephone and verified that I am speaking with the correct person using two identifiers.   I discussed the limitations, risks, security and privacy concerns of performing an evaluation and management service by telephone and the availability of in person appointments. I also discussed with the patient that there may be a patient responsible charge related to this service. The patient expressed understanding and agreed to proceed.  Location patient: home Location provider: work or home office Participants present for the call: patient, provider Patient did not have a visit in the prior 7 days to address this/these issue(s).   History of Present Illness: This 79 year old male who is being evaluated today for follow-up after recent respiratory infection as well as for medication management.  Last week he was prescribed Augmentin suspected respiratory infection.  For  His symptoms have resolved and he is feeling a lot better. He denies productive cough, fatigue, or fever.   Distantly, he has a lot of duplicate medications at home and wants to review his medication list to make sure he is taking what he needs to take.   Observations/Objective: Patient sounds cheerful and well on the phone. I do not appreciate any SOB. Speech and thought processing are grossly intact. Patient reported vitals:  Assessment and Plan: 1. Respiratory infection - Symptoms have resolved. No further treatment noted.   2. Medication management -We reviewed his medications in detail and he was advised on which one he should be taking for chronic care management.  Medications were discontinued from his medication list on medications that he no longer needed or he was not taking.  Patient had understanding of all of his medications upon completion of this visit   Follow Up Instructions:  I did not refer this patient for  an OV in the next 24 hours for this/these issue(s).  I discussed the assessment and treatment plan with the patient. The patient was provided an opportunity to ask questions and all were answered. The patient agreed with the plan and demonstrated an understanding of the instructions.   The patient was advised to call back or seek an in-person evaluation if the symptoms worsen or if the condition fails to improve as anticipated.  I provided 23 minutes of non-face-to-face time during this encounter.   Dorothyann Peng, NP

## 2019-08-02 ENCOUNTER — Encounter: Payer: Self-pay | Admitting: Adult Health

## 2019-08-07 DIAGNOSIS — G4733 Obstructive sleep apnea (adult) (pediatric): Secondary | ICD-10-CM | POA: Diagnosis not present

## 2019-08-13 ENCOUNTER — Ambulatory Visit: Payer: Medicare HMO | Attending: Internal Medicine

## 2019-08-13 DIAGNOSIS — Z23 Encounter for immunization: Secondary | ICD-10-CM | POA: Insufficient documentation

## 2019-08-13 NOTE — Progress Notes (Signed)
   Covid-19 Vaccination Clinic  Name:  Marcus [name]    MRN: [id number] DOB: 1941-06-13  08/13/2019  [name] was observed post Covid-19 immunization for 15 minutes without incidence. He was provided with Vaccine Information Sheet and instruction to access the V-Safe system.   [name] was instructed to call 911 with any severe reactions post vaccine: [name] Kitchen Difficulty breathing  . Swelling of your face and throat  . A fast heartbeat  . A bad rash all over your body  . Dizziness and weakness    Immunizations Administered    Name Date Dose VIS Date Route   Pfizer COVID-19 Vaccine 08/13/2019  4:13 PM 0.3 mL 06/15/2019 Intramuscular   Manufacturer: [place]   Lot: [id number]   [place]: [id number]

## 2019-08-20 ENCOUNTER — Ambulatory Visit (INDEPENDENT_AMBULATORY_CARE_PROVIDER_SITE_OTHER): Payer: Medicare HMO | Admitting: *Deleted

## 2019-08-20 DIAGNOSIS — I459 Conduction disorder, unspecified: Secondary | ICD-10-CM | POA: Diagnosis not present

## 2019-08-21 ENCOUNTER — Other Ambulatory Visit: Payer: Self-pay

## 2019-08-21 ENCOUNTER — Telehealth (INDEPENDENT_AMBULATORY_CARE_PROVIDER_SITE_OTHER): Payer: Medicare HMO | Admitting: Adult Health

## 2019-08-21 ENCOUNTER — Ambulatory Visit (INDEPENDENT_AMBULATORY_CARE_PROVIDER_SITE_OTHER)
Admission: RE | Admit: 2019-08-21 | Discharge: 2019-08-21 | Disposition: A | Discharge status: Home / Self Care | Payer: Medicare HMO | Source: Ambulatory Visit | Attending: Adult Health | Admitting: Adult Health

## 2019-08-21 DIAGNOSIS — R05 Cough: Secondary | ICD-10-CM

## 2019-08-21 DIAGNOSIS — R058 Other specified cough: Secondary | ICD-10-CM

## 2019-08-21 DIAGNOSIS — Z20822 Contact with and (suspected) exposure to covid-19: Secondary | ICD-10-CM | POA: Diagnosis not present

## 2019-08-21 LAB — CUP PACEART REMOTE DEVICE CHECK
Date Time Interrogation Session: 20210216061601
Implantable Lead Implant Date: 20200129
Implantable Lead Implant Date: 20200129
Implantable Lead Location: 753859
Implantable Lead Location: 753860
Implantable Lead Model: 377
Implantable Lead Model: 377
Implantable Lead Serial Number: 80947537
Implantable Lead Serial Number: 80970966
Implantable Pulse Generator Implant Date: 20200129
Pulse Gen Model: 407145
Pulse Gen Serial Number: 69503797

## 2019-08-21 NOTE — Progress Notes (Signed)
Virtual Visit via Telephone Note  I connected with Marcus [name] on 08/21/19 at  2:00 PM EST by telephone and verified that I am speaking with the correct person using two identifiers.   I discussed the limitations, risks, security and privacy concerns of performing an evaluation and management service by telephone and the availability of in person appointments. I also discussed with the patient that there may be a patient responsible charge related to this service. The patient expressed understanding and agreed to proceed.  Location patient: home Location provider: work or home office Participants present for the call: patient, provider Patient did not have a visit in the prior 7 days to address this/these issue(s).   History of Present Illness: 79 year old male who is being evaluated today for the acute issue of productive cough.  Symptoms have been present for greater than 1 month.  About a month ago he was treated with a course of Augmentin suspected URI.  He was seen for follow-up and reported at that time that he was feeling a lot better and his symptoms had improved.  Today he reports that he does not think his symptoms improved and he continues to cough up " thick mucus".  He often feels as though he is choking on this thick mucus and has had intermittent shortness of breath.  He does endorse having chills about 2 weeks ago but has not had any since.  He denies fevers, sinus pain or pressure, postnasal drip, or feeling acutely ill.  He does wear a CPAP machine.   Observations/Objective: Patient sounds cheerful and well on the phone. I do not appreciate any SOB. Speech and thought processing are grossly intact. Patient reported vitals:  Assessment and Plan: 1. Productive cough -Concern for COVID-19 infection at this time.  Need to rule out pneumonia.  Will order chest x-ray.  Consider referral to ear nose and throat or pulmonary for further evaluation - DG Chest 2 View;  Future   Follow Up Instructions:  I did not refer this patient for an OV in the next 24 hours for this/these issue(s).  I discussed the assessment and treatment plan with the patient. The patient was provided an opportunity to ask questions and all were answered. The patient agreed with the plan and demonstrated an understanding of the instructions.   The patient was advised to call back or seek an in-person evaluation if the symptoms worsen or if the condition fails to improve as anticipated.  I provided 20 minutes of non-face-to-face time during this encounter.   Dorothyann Peng, NP

## 2019-08-21 NOTE — Progress Notes (Signed)
PPM Remote  

## 2019-08-22 ENCOUNTER — Telehealth: Payer: Self-pay | Admitting: Adult Health

## 2019-08-22 DIAGNOSIS — J329 Chronic sinusitis, unspecified: Secondary | ICD-10-CM

## 2019-08-22 NOTE — Telephone Encounter (Signed)
Poke to patient and informed him of his chest x-ray did not see any acute pulmonary issues.  Since the productive cough has been a chronic issue and multiple nasal sprays have been tried he would like to see an ear nose and throat for further evaluation

## 2019-08-28 ENCOUNTER — Other Ambulatory Visit: Payer: Self-pay

## 2019-08-28 ENCOUNTER — Encounter (INDEPENDENT_AMBULATORY_CARE_PROVIDER_SITE_OTHER): Payer: Self-pay | Admitting: Otolaryngology

## 2019-08-28 ENCOUNTER — Ambulatory Visit (INDEPENDENT_AMBULATORY_CARE_PROVIDER_SITE_OTHER): Payer: Medicare HMO | Admitting: Otolaryngology

## 2019-08-28 VITALS — Temp 97.3°F

## 2019-08-28 DIAGNOSIS — R05 Cough: Secondary | ICD-10-CM | POA: Diagnosis not present

## 2019-08-28 DIAGNOSIS — J310 Chronic rhinitis: Secondary | ICD-10-CM | POA: Diagnosis not present

## 2019-08-28 DIAGNOSIS — R053 Chronic cough: Secondary | ICD-10-CM

## 2019-08-28 NOTE — Progress Notes (Signed)
HPI: Marcus [name] is a 79 y.o. male who presents is referred by his PCP for evaluation of a chronic cough that he has had for several years.  He apparently had a clear chest x-ray recently.  He feels like the cough comes from thick mucus that builds up in his chest and complains of chest congestion..  He has history of obstructive sleep apnea and uses nasal CPAP.  He has intermittent nasal congestion and has used Afrin in the past but has not used it this past week. He had a sinus CT scan performed in 2016 because of chronic cough and this demonstrated clear paranasal sinuses. He does not smoke.  Past Medical History:  Diagnosis Date  . Acute meniscal tear of knee left  . Arthritis    generalized  . Calcaneal spur   . Colon cancer [facility]) 1983   surgical removed. did not receive radiation or chemotherapy.  . Coronary artery disease    dr Stanford Breed  . Cramps of lower extremity    in the mornings occ.  . Depression   . Diverticulosis of colon (without mention of hemorrhage)   . Echocardiogram findings abnormal, without diagnosis 04-23-2010   normal LV function, mod. left atrial enlargement, mild right artrial enlargement and grade 1 diastolic dysfunction  . Edema of lower extremity    ankles, elevates legs  . Elevated prostate specific antigen (PSA)   . Esophageal reflux   . External hemorrhoids   . Frequency of urination    followed by dr dalhstedt  . Gout   . Hiatal hernia   . History of colon cancer 1988  . Hyperlipidemia   . Lumbar spondylolysis   . Malaria 1964  . Normal nuclear stress test 04-23-2010   ef 51%,  septal hypokinesis, normal perfusion  . OSA (obstructive sleep apnea)    does not use CPAP   . Paraesophageal hernia    large  . Personal history of colonic polyps 08/21/2009   TUBULAR ADENOMAS  . Pneumonia    history of; last episode 2015  . Postgastric surgery syndromes   . Presence of permanent cardiac pacemaker   . Prostate cancer ([place]) 2016  . Unspecified  essential hypertension   . Unspecified vitamin D deficiency   . Ventral hernia   . Vitamin B12 deficiency    Past Surgical History:  Procedure Laterality Date  . CARDIAC CATHETERIZATION    . CATARACT EXTRACTION W/ INTRAOCULAR LENS  IMPLANT, BILATERAL Bilateral   . COLONOSCOPY    . ESOPHAGOGASTRODUODENOSCOPY    . KNEE ARTHROSCOPY Right 2007  . KNEE ARTHROSCOPY  07/13/2011   Procedure: ARTHROSCOPY KNEE;  Surgeon: Cynda Familia;  Location: [facility];  Service: Orthopedics;  Laterality: Left;  partial menisectomy with chondrylplasty  . KNEE SURGERY Left   . LAMINECTOMY AND MICRODISCECTOMY LUMBAR SPINE  MARCH  2008   L3 -  4  . LAPAROSCOPIC INCISIONAL / UMBILICAL / VENTRAL HERNIA REPAIR  2006  . PACEMAKER IMPLANT N/A 08/02/2018   Procedure: PACEMAKER IMPLANT;  Surgeon: Evans Lance, MD;  Location: [facility];  Service: Cardiovascular;  Laterality: N/A;  . PROSTATE BIOPSY    . RADIOACTIVE SEED IMPLANT N/A 02/12/2019   Procedure: RADIOACTIVE SEED IMPLANT/BRACHYTHERAPY IMPLANT;  Surgeon: Franchot Gallo, MD;  Location: [facility];  Service: Urology;  Laterality: N/A;  90 MINS  . SHOULDER ARTHROSCOPY Right 05-23-2007  . SIGMOID COLECTOMY FOR CANCER  1989  . SPACE OAR INSTILLATION N/A 02/12/2019   Procedure:  SPACE OAR INSTILLATION;  Surgeon: Franchot Gallo, MD;  Location: [facility];  Service: Urology;  Laterality: N/A;  . TOTAL KNEE ARTHROPLASTY Left 05/23/2015   Procedure: LEFT TOTAL KNEE ARTHROPLASTY;  Surgeon: Sydnee Cabal, MD;  Location: [facility];  Service: Orthopedics;  Laterality: Left;  [name] Kitchen VERTICAL BANDED GASTROPLASTY  1986   Social History   Socioeconomic History  . Marital status: Married    Spouse name: Not on file  . Number of children: 4  . Years of education: Not on file  . Highest education level: Not on file  Occupational History  . Occupation: retired    Fish farm manager: [employer]  Tobacco Use  . Smoking status: Former Smoker     Packs/day: 2.00    Years: 20.00    Pack years: 40.00    Types: Cigarettes    Quit date: 07/05/1974    Years since quitting: 45.1  . Smokeless tobacco: Never Used  Substance and Sexual Activity  . Alcohol use: Yes    Alcohol/week: 0.0 standard drinks    Comment: socially; also 1-2 glasses of wine or beer every other day   . Drug use: No  . Sexual activity: Not Currently  Other Topics Concern  . Not on file  Social History Narrative   Recently widowed   Former Smoker    Alcohol use-yes 1-2 drinks per day      Occupation: Retired Midwife      Originally from [place], [place] - in [place] > 10 yrs as of 2017      Social Determinants of Health   Financial Resource Strain:   . Difficulty of Paying Living Expenses: Not on file  Food Insecurity:   . Worried About Charity fundraiser in the Last Year: Not on file  . Ran Out of Food in the Last Year: Not on file  Transportation Needs:   . Lack of Transportation (Medical): Not on file  . Lack of Transportation (Non-Medical): Not on file  Physical Activity:   . Days of Exercise per Week: Not on file  . Minutes of Exercise per Session: Not on file  Stress:   . Feeling of Stress : Not on file  Social Connections:   . Frequency of Communication with Friends and Family: Not on file  . Frequency of Social Gatherings with Friends and Family: Not on file  . Attends Religious Services: Not on file  . Active Member of Clubs or Organizations: Not on file  . Attends Archivist Meetings: Not on file  . Marital Status: Not on file   Family History  Problem Relation Age of Onset  . Stroke Paternal Grandfather   . Heart disease Paternal Grandfather   . Esophagitis Father        died from perforated esophagus  . Breast cancer Mother   . Colon cancer Maternal Grandmother        questionable   Allergies  Allergen Reactions  . Contrast Media [Iodinated Diagnostic Agents] Palpitations    TACHYCARDIA- patient states he has tolerated newer  agents since this reaction >30 yrs ago   Prior to Admission medications   Medication Sig Start Date End Date Taking? Authorizing Provider  allopurinol (ZYLOPRIM) 300 MG tablet TAKE 1 TABLET EVERY DAY 03/27/19  Yes Nafziger, Tommi Rumps, NP  amLODipine (NORVASC) 10 MG tablet TAKE 1 TABLET EVERY DAY 03/27/19  Yes Nafziger, Tommi Rumps, NP  Calcium Carb-Cholecalciferol (CALCIUM 600 + D PO) Take 1 tablet by mouth daily.   Yes  [provider]  cetirizine (ZYRTEC) 10 MG tablet Take 10 mg by mouth daily.   Yes [provider]  Cyanocobalamin (VITAMIN B-12) 5000 MCG TBDP Take 1 tablet by mouth daily.    Yes [provider]  Docusate Calcium (STOOL SOFTENER PO) Take 1 tablet by mouth daily.   Yes [provider]  finasteride (PROSCAR) 5 MG tablet Take 5 mg by mouth daily.   Yes [provider]  magnesium oxide (MAG-OX) 400 MG tablet Take 400 mg by mouth daily.   Yes [provider]  omeprazole (PRILOSEC) 40 MG capsule Take 1 capsule (40 mg total) by mouth daily. 12/07/18  Yes Nafziger, Tommi Rumps, NP  oxymetazoline (AFRIN) 0.05 % nasal spray Place 1 spray into both nostrils at bedtime.   Yes [provider]  simvastatin (ZOCOR) 20 MG tablet Take 1 tablet (20 mg total) by mouth at bedtime. 12/07/18  Yes Nafziger, Tommi Rumps, NP  tamsulosin (FLOMAX) 0.4 MG CAPS capsule Take 0.4 mg by mouth daily.  03/04/16  Yes [provider]  valsartan-hydrochlorothiazide (DIOVAN-HCT) 320-25 MG tablet TAKE 1 TABLET EVERY DAY 03/27/19  Yes Nafziger, Tommi Rumps, NP     Positive ROS: Otherwise negative  All other systems have been reviewed and were otherwise negative with the exception of those mentioned in the HPI and as above.  Physical Exam: Constitutional: Alert, well-appearing, no acute distress Ears: External ears without lesions or tenderness. Ear canals are clear bilaterally with intact, clear TMs.  Nasal: External nose without lesions. Septum relatively midline with mild to  moderate rhinitis..  No polyps noted.  Both middle meatus regions were clear with no signs of infection.  Minimal clear mucus noted in the nasal cavity. Oral: Lips and gums without lesions. Tongue and palate mucosa without lesions. Posterior oropharynx clear. Indirect laryngoscopy revealed a clear base of tongue vallecula and epiglottis were normal.  Vocal cords were clear with normal vocal cord mobility.  Subglottis was clear. Neck: No palpable adenopathy or masses Respiratory: Breathing comfortably  Skin: No facial/neck lesions or rash noted.  Procedures  Assessment: Chronic rhinitis. Chronic cough which I suspect is probably more pulmonary in nature.  Plan: Instead of use of Afrin suggested regular use of Nasacort 2 sprays each nostril at night. Would recommend referral to pulmonary for further evaluation of chronic cough if symptoms warrant this as he has had a chronic cough for over 5 years.   Radene Journey, MD   CC:

## 2019-08-29 ENCOUNTER — Telehealth: Payer: Self-pay | Admitting: *Deleted

## 2019-08-29 ENCOUNTER — Telehealth: Payer: Self-pay | Admitting: Adult Health

## 2019-08-29 DIAGNOSIS — R053 Chronic cough: Secondary | ICD-10-CM

## 2019-08-29 DIAGNOSIS — R05 Cough: Secondary | ICD-10-CM

## 2019-08-29 NOTE — Telephone Encounter (Signed)
Patient called after hours line. Patient verbalizes he would like a referral to a lung doctor. The patient did see ENT on 08/28/2019. Per ENT note they believe chronic cough is is more pulmonary in nature and would recommend a referral to pulmonary for further evaluation. Please advise?

## 2019-08-29 NOTE — Addendum Note (Signed)
Addended by: Miles Costain T on: 08/29/2019 09:43 AM   Modules accepted: Orders

## 2019-08-29 NOTE — Telephone Encounter (Signed)
Order placed for referral.  

## 2019-08-29 NOTE — Telephone Encounter (Signed)
Ok to refer to pulmonary for chronic cough

## 2019-08-29 NOTE — Telephone Encounter (Signed)
Pt went to ENT doc and the test they ran came back negative. Pt is informing Marcus [name] that he was told that he needs someone who specializes in lungs and would need a referral?  Pt can be reached at [phone] ok to leave a detailed message at this number per pt.

## 2019-08-29 NOTE — Telephone Encounter (Signed)
Pt notified that a referral has been placed and he will be contacted by pulmonary to schedule an appointment.  Nothing further needed.

## 2019-08-30 ENCOUNTER — Ambulatory Visit (INDEPENDENT_AMBULATORY_CARE_PROVIDER_SITE_OTHER): Payer: Medicare HMO | Admitting: Otolaryngology

## 2019-09-04 DIAGNOSIS — G4733 Obstructive sleep apnea (adult) (pediatric): Secondary | ICD-10-CM | POA: Diagnosis not present

## 2019-09-07 ENCOUNTER — Ambulatory Visit: Payer: Medicare HMO

## 2019-09-11 ENCOUNTER — Ambulatory Visit: Payer: Medicare HMO | Attending: Internal Medicine

## 2019-09-11 DIAGNOSIS — Z23 Encounter for immunization: Secondary | ICD-10-CM | POA: Insufficient documentation

## 2019-09-11 NOTE — Progress Notes (Signed)
   Covid-19 Vaccination Clinic  Name:  Marcus [name]    MRN: [id number] DOB: 05-03-1941  09/11/2019  [name] was observed post Covid-19 immunization for 15 minutes without incident. He was provided with Vaccine Information Sheet and instruction to access the V-Safe system.   [name] was instructed to call 911 with any severe reactions post vaccine: [name] Kitchen Difficulty breathing  . Swelling of face and throat  . A fast heartbeat  . A bad rash all over body  . Dizziness and weakness   Immunizations Administered    Name Date Dose VIS Date Route   Pfizer COVID-19 Vaccine 09/11/2019  8:43 AM 0.3 mL 06/15/2019 Intramuscular   Manufacturer: [place]   Lot: [id number]   [place]: [id number]

## 2019-09-12 DIAGNOSIS — C61 Malignant neoplasm of prostate: Secondary | ICD-10-CM | POA: Diagnosis not present

## 2019-09-19 DIAGNOSIS — C61 Malignant neoplasm of prostate: Secondary | ICD-10-CM | POA: Diagnosis not present

## 2019-09-19 DIAGNOSIS — N400 Enlarged prostate without lower urinary tract symptoms: Secondary | ICD-10-CM | POA: Diagnosis not present

## 2019-09-19 DIAGNOSIS — R350 Frequency of micturition: Secondary | ICD-10-CM | POA: Diagnosis not present

## 2019-09-19 NOTE — Progress Notes (Signed)
Synopsis: Referred in March 2021 for cough by Dorothyann Peng, NP  Subjective:   PATIENT ID: Marcus [name] GENDER: male DOB: [birth year], MRN: [id number]  Chief Complaint  Patient presents with  . Consult    Patient is here for cough that has been going on for past couple years. Patient states that its thick phelgm which is sometimes clear sometimes yellow.     Marcus [name] is a 79 y/o gentleman with a history of obesity s/p bariatric surgery in 1980s, OSA on CPAP, and GERD who presents for evalaution of chronic cough.  His cough has been persistent but not worsening for the past 2 years.  It is associated with thick yellow sputum production.  No shortness of breath or wheezing.  The coughing is throughout the day every day.  It is worse when he has significant episodes of regurgitation, which happens about once a month if he eats dinner too late.  He will wake up at night after having regurgitation and he is coughing up regurgitated material for about 2 hours, sometimes including food particles.  He has a remote history of pneumonia and has had bronchitis a few times.  No previous history of lung disease.  No family history of lung disease.  He has not been on inhalers.  He was recently seen by Dr. Lucia Gaskins from ENT on 08/28/2019 (note reviewed) who felt that his cough was unrelated to postnasal drip.  He had a sinus CT did not demonstrate any ongoing sinus issues.  He recommended starting Flonase, which the patient has not been taking.  He quit smoking 45 years ago, but previously had smoked 2 packs/day for 20 years.  He is not on an ACE inhibitor.  He has previously been managed by Dr. Carlean Purl from GI, last visit in 08/2018 (note reviewed).  He has a history of GERD and delayed gastric emptying.  He is previously required dilation of a stricture in the distal stomach.  He prescribed Reglan, which Marcus [name] is not taking.  He is on omeprazole daily.  He does not have episodes of heartburn, only the  episodes of regurgitation.      Past Medical History:  Diagnosis Date  . Acute meniscal tear of knee left  . Arthritis    generalized  . Calcaneal spur   . Colon cancer [facility]) 1983   surgical removed. did not receive radiation or chemotherapy.  . Coronary artery disease    dr Stanford Breed  . Cramps of lower extremity    in the mornings occ.  . Depression   . Diverticulosis of colon (without mention of hemorrhage)   . Echocardiogram findings abnormal, without diagnosis 04-23-2010   normal LV function, mod. left atrial enlargement, mild right artrial enlargement and grade 1 diastolic dysfunction  . Edema of lower extremity    ankles, elevates legs  . Elevated prostate specific antigen (PSA)   . Esophageal reflux   . External hemorrhoids   . Frequency of urination    followed by dr dalhstedt  . Gout   . Hiatal hernia   . History of colon cancer 1988  . Hyperlipidemia   . Lumbar spondylolysis   . Malaria 1964  . Normal nuclear stress test 04-23-2010   ef 51%,  septal hypokinesis, normal perfusion  . OSA (obstructive sleep apnea)    does not use CPAP   . Paraesophageal hernia    large  . Personal history of colonic polyps 08/21/2009   TUBULAR ADENOMAS  . Pneumonia  history of; last episode 2015  . Postgastric surgery syndromes   . Presence of permanent cardiac pacemaker   . Prostate cancer ([place]) 2016  . Unspecified essential hypertension   . Unspecified vitamin D deficiency   . Ventral hernia   . Vitamin B12 deficiency      Family History  Problem Relation Age of Onset  . Stroke Paternal Grandfather   . Heart disease Paternal Grandfather   . Esophagitis Father        died from perforated esophagus  . Breast cancer Mother   . Colon cancer Maternal Grandmother        questionable     Past Surgical History:  Procedure Laterality Date  . CARDIAC CATHETERIZATION    . CATARACT EXTRACTION W/ INTRAOCULAR LENS  IMPLANT, BILATERAL Bilateral   . COLONOSCOPY    .  ESOPHAGOGASTRODUODENOSCOPY    . KNEE ARTHROSCOPY Right 2007  . KNEE ARTHROSCOPY  07/13/2011   Procedure: ARTHROSCOPY KNEE;  Surgeon: Cynda Familia;  Location: [facility];  Service: Orthopedics;  Laterality: Left;  partial menisectomy with chondrylplasty  . KNEE SURGERY Left   . LAMINECTOMY AND MICRODISCECTOMY LUMBAR SPINE  MARCH  2008   L3 -  4  . LAPAROSCOPIC INCISIONAL / UMBILICAL / VENTRAL HERNIA REPAIR  2006  . PACEMAKER IMPLANT N/A 08/02/2018   Procedure: PACEMAKER IMPLANT;  Surgeon: Evans Lance, MD;  Location: [facility];  Service: Cardiovascular;  Laterality: N/A;  . PROSTATE BIOPSY    . RADIOACTIVE SEED IMPLANT N/A 02/12/2019   Procedure: RADIOACTIVE SEED IMPLANT/BRACHYTHERAPY IMPLANT;  Surgeon: Franchot Gallo, MD;  Location: [facility];  Service: Urology;  Laterality: N/A;  90 MINS  . SHOULDER ARTHROSCOPY Right 05-23-2007  . SIGMOID COLECTOMY FOR CANCER  1989  . SPACE OAR INSTILLATION N/A 02/12/2019   Procedure: SPACE OAR INSTILLATION;  Surgeon: Franchot Gallo, MD;  Location: [facility];  Service: Urology;  Laterality: N/A;  . TOTAL KNEE ARTHROPLASTY Left 05/23/2015   Procedure: LEFT TOTAL KNEE ARTHROPLASTY;  Surgeon: Sydnee Cabal, MD;  Location: [facility];  Service: Orthopedics;  Laterality: Left;  [name] Kitchen VERTICAL BANDED GASTROPLASTY  1986    Social History   Socioeconomic History  . Marital status: Married    Spouse name: Not on file  . Number of children: 4  . Years of education: Not on file  . Highest education level: Not on file  Occupational History  . Occupation: retired    Fish farm manager: [employer]  Tobacco Use  . Smoking status: Former Smoker    Packs/day: 2.00    Years: 20.00    Pack years: 40.00    Types: Cigarettes    Quit date: 07/05/1974    Years since quitting: 45.2  . Smokeless tobacco: Never Used  Substance and Sexual Activity  . Alcohol use: Yes    Alcohol/week: 0.0 standard drinks    Comment: socially; also 1-2 glasses  of wine or beer every other day   . Drug use: No  . Sexual activity: Not Currently  Other Topics Concern  . Not on file  Social History Narrative   Recently widowed   Former Smoker    Alcohol use-yes 1-2 drinks per day      Occupation: Retired Midwife      Originally from [place], [place] - in [place] > 10 yrs as of 2017      Social Determinants of Health   Financial Resource Strain:   . Difficulty of Paying Living Expenses:  Food Insecurity:   . Worried About Charity fundraiser in the Last Year:   . Arboriculturist in the Last Year:   Transportation Needs:   . Film/video editor (Medical):   [name] Kitchen Lack of Transportation (Non-Medical):   Physical Activity:   . Days of Exercise per Week:   . Minutes of Exercise per Session:   Stress:   . Feeling of Stress :   Social Connections:   . Frequency of Communication with Friends and Family:   . Frequency of Social Gatherings with Friends and Family:   . Attends Religious Services:   . Active Member of Clubs or Organizations:   . Attends Archivist Meetings:   [name] Kitchen Marital Status:   Intimate Partner Violence:   . Fear of Current or Ex-Partner:   . Emotionally Abused:   [name] Kitchen Physically Abused:   . Sexually Abused:      Allergies  Allergen Reactions  . Contrast Media [Iodinated Diagnostic Agents] Palpitations    TACHYCARDIA- patient states he has tolerated newer agents since this reaction >30 yrs ago     Immunization History  Administered Date(s) Administered  . Influenza Whole 06/05/2007, 04/30/2008, 03/31/2009  . Influenza, High Dose Seasonal PF 06/19/2014, 07/30/2015, 03/03/2017  . Influenza,inj,quad, With Preservative 04/04/2017  . PFIZER SARS-COV-2 Vaccination 08/13/2019, 09/11/2019  . Pneumococcal Conjugate-13 06/19/2014, 07/30/2015  . Pneumococcal Polysaccharide-23 12/11/2003, 03/03/2017  . Td 03/31/2009    Outpatient Medications Prior to Visit  Medication Sig Dispense Refill  . allopurinol (ZYLOPRIM) 300 MG  tablet TAKE 1 TABLET EVERY DAY 90 tablet 1  . amLODipine (NORVASC) 10 MG tablet TAKE 1 TABLET EVERY DAY 90 tablet 1  . Calcium Carb-Cholecalciferol (CALCIUM 600 + D PO) Take 1 tablet by mouth daily.    . cetirizine (ZYRTEC) 10 MG tablet Take 10 mg by mouth daily.    . Cyanocobalamin (VITAMIN B-12) 5000 MCG TBDP Take 1 tablet by mouth daily.     [name] Calcium (STOOL SOFTENER PO) Take 1 tablet by mouth daily.    . magnesium oxide (MAG-OX) 400 MG tablet Take 400 mg by mouth daily.    [name] Kitchen omeprazole (PRILOSEC) 40 MG capsule Take 1 capsule (40 mg total) by mouth daily. 90 capsule 1  . oxymetazoline (AFRIN) 0.05 % nasal spray Place 1 spray into both nostrils at bedtime.    . simvastatin (ZOCOR) 20 MG tablet Take 1 tablet (20 mg total) by mouth at bedtime. 90 tablet 1  . tamsulosin (FLOMAX) 0.4 MG CAPS capsule Take 0.4 mg by mouth daily.     . valsartan-hydrochlorothiazide (DIOVAN-HCT) 320-25 MG tablet TAKE 1 TABLET EVERY DAY 90 tablet 1  . finasteride (PROSCAR) 5 MG tablet Take 5 mg by mouth daily.     No facility-administered medications prior to visit.    Review of Systems  Constitutional: Negative for chills and fever.  HENT: Negative for congestion.        No postnasal drip  Respiratory: Positive for cough and sputum production. Negative for hemoptysis, shortness of breath and wheezing.   Cardiovascular: Negative.   Gastrointestinal: Positive for heartburn.  Skin: Negative for rash.     Objective:   Vitals:   09/20/19 1123  BP: 120/78  Pulse: 60  Temp: 98.1 F (36.7 C)  TempSrc: Temporal  SpO2: 96%  Weight: 266 lb 6.4 oz (120.8 kg)  Height: 5\' 8"  (1.727 m)   96% on   RA BMI Readings from Last 3 Encounters:  09/20/19 40.51  kg/m  05/18/19 39.53 kg/m  03/13/19 39.23 kg/m   Wt Readings from Last 3 Encounters:  09/20/19 266 lb 6.4 oz (120.8 kg)  05/18/19 260 lb (117.9 kg)  03/13/19 258 lb (117 kg)    Physical Exam Vitals reviewed.  Constitutional:       Appearance: Normal appearance. He is obese. He is not ill-appearing.  HENT:     Head: Normocephalic and atraumatic.  Eyes:     General: No scleral icterus. Cardiovascular:     Rate and Rhythm: Normal rate and regular rhythm.     Heart sounds: No murmur.  Pulmonary:     Comments: Breathing comfortably on room air, no conversational dyspnea.  No coughing during encounter.  CTA B. Abdominal:     General: There is no distension.     Palpations: Abdomen is soft.  Musculoskeletal:        General: No deformity.     Cervical back: Neck supple.     Comments: Symmetric lower extremity edema  Lymphadenopathy:     Cervical: No cervical adenopathy.  Skin:    General: Skin is warm and dry.     Findings: No rash.  Neurological:     General: No focal deficit present.     Mental Status: He is alert.     Coordination: Coordination normal.  Psychiatric:        Mood and Affect: Mood normal.        Behavior: Behavior normal.      CBC    Component Value Date/Time   WBC 6.2 02/07/2019 1446   RBC 4.02 (L) 02/07/2019 1446   HGB 11.8 (L) 02/07/2019 1446   HCT 37.3 (L) 02/07/2019 1446   PLT 230 02/07/2019 1446   MCV 92.8 02/07/2019 1446   MCH 29.4 02/07/2019 1446   MCHC 31.6 02/07/2019 1446   RDW 14.9 02/07/2019 1446   LYMPHSABS 1.5 07/12/2018 1036   MONOABS 0.6 07/12/2018 1036   EOSABS 0.1 07/12/2018 1036   BASOSABS 0.0 07/12/2018 1036    CHEMISTRY No results for input(s): NA, K, CL, CO2, GLUCOSE, BUN, CREATININE, CALCIUM, MG, PHOS in the last 168 hours. CrCl cannot be calculated (Patient's most recent lab result is older than the maximum 21 days allowed.).   Chest Imaging- films reviewed: CXR, 2 view 08/21/2019-elevated left hemidiaphragm, pacemaker in place.  Mild kyphosis with flattened hemidiaphragms most notable posteriorly.  CT chest 01/17/2015-lung parenchyma, no significant mediastinal adenopathy.  Senescent vascular calcification.  Pulmonary Functions Testing Results: No  flowsheet data found.   Echocardiogram 07/24/18: LVEF 55 to [id number], grade 1 diastolic dysfunction, mildly dilated ascending aorta.  Leg, RV, RA.  Trivial  and PR, otherwise normal valves.MR      Assessment & Plan:     ICD-10-CM   1. Gastroesophageal reflux disease, unspecified whether esophagitis present  K21.9   2. Cough  R05 CT Chest High Resolution    Chronic cough- concerned for complications of chronic uncontrolled GERD with significant regurgitation episodes. With chronic sputum production bronchiectasis is possible. COPD from previous tobacco use is also possible.  -HRCT chest -We will consider PFTs depending on CT results if COPD looks like the more likely cause of his symptoms. -Recommended follow-up with GI and strict compliance with lifestyle modifications necessary to control his symptoms.  He should avoid eating late in the evening at all costs.  RTC in 1 month.   Current Outpatient Medications:  .  allopurinol (ZYLOPRIM) 300 MG tablet, TAKE 1 TABLET EVERY DAY, Disp: 90  tablet, Rfl: 1 .  amLODipine (NORVASC) 10 MG tablet, TAKE 1 TABLET EVERY DAY, Disp: 90 tablet, Rfl: 1 .  Calcium Carb-Cholecalciferol (CALCIUM 600 + D PO), Take 1 tablet by mouth daily., Disp: , Rfl:  .  cetirizine (ZYRTEC) 10 MG tablet, Take 10 mg by mouth daily., Disp: , Rfl:  .  Cyanocobalamin (VITAMIN B-12) 5000 MCG TBDP, Take 1 tablet by mouth daily. , Disp: , Rfl:  .  Docusate Calcium (STOOL SOFTENER PO), Take 1 tablet by mouth daily., Disp: , Rfl:  .  magnesium oxide (MAG-OX) 400 MG tablet, Take 400 mg by mouth daily., Disp: , Rfl:  .  omeprazole (PRILOSEC) 40 MG capsule, Take 1 capsule (40 mg total) by mouth daily., Disp: 90 capsule, Rfl: 1 .  oxymetazoline (AFRIN) 0.05 % nasal spray, Place 1 spray into both nostrils at bedtime., Disp: , Rfl:  .  simvastatin (ZOCOR) 20 MG tablet, Take 1 tablet (20 mg total) by mouth at bedtime., Disp: 90 tablet, Rfl: 1 .  tamsulosin (FLOMAX) 0.4 MG CAPS capsule, Take  0.4 mg by mouth daily. , Disp: , Rfl:  .  valsartan-hydrochlorothiazide (DIOVAN-HCT) 320-25 MG tablet, TAKE 1 TABLET EVERY DAY, Disp: 90 tablet, Rfl: 1     Julian Hy, DO [facility] 09/20/2019 12:56 PM

## 2019-09-20 ENCOUNTER — Encounter: Payer: Self-pay | Admitting: Critical Care Medicine

## 2019-09-20 ENCOUNTER — Other Ambulatory Visit: Payer: Self-pay

## 2019-09-20 ENCOUNTER — Ambulatory Visit: Payer: Medicare HMO | Admitting: Critical Care Medicine

## 2019-09-20 VITALS — BP 120/78 | HR 60 | Temp 98.1°F | Ht 68.0 in | Wt 266.4 lb

## 2019-09-20 DIAGNOSIS — R05 Cough: Secondary | ICD-10-CM | POA: Diagnosis not present

## 2019-09-20 DIAGNOSIS — K219 Gastro-esophageal reflux disease without esophagitis: Secondary | ICD-10-CM

## 2019-09-20 DIAGNOSIS — R059 Cough, unspecified: Secondary | ICD-10-CM

## 2019-09-20 NOTE — Patient Instructions (Addendum)
Thank you for visiting Dr. Carlis Abbott at [facility]. We recommend the following: Orders Placed This Encounter  Procedures  . CT Chest High Resolution   Orders Placed This Encounter  Procedures  . CT Chest High Resolution    Standing Status:   Future    Standing Expiration Date:   11/19/2020    Order Specific Question:   ** REASON FOR EXAM (FREE TEXT)    Answer:   chronic cough with sputum production, uncontrolled GERD/ reflux    Order Specific Question:   Preferred imaging location?    Answer:   [facility]    Order Specific Question:   Radiology Contrast Protocol - do NOT remove file path    Answer:   [email or web address]    No orders of the defined types were placed in this encounter.   Return in about 4 weeks (around 10/18/2019).    Please do your part to reduce the spread of COVID-19.

## 2019-09-27 ENCOUNTER — Ambulatory Visit (INDEPENDENT_AMBULATORY_CARE_PROVIDER_SITE_OTHER)
Admission: RE | Admit: 2019-09-27 | Discharge: 2019-09-27 | Disposition: A | Discharge status: Home / Self Care | Payer: Medicare HMO | Source: Ambulatory Visit | Attending: Critical Care Medicine | Admitting: Critical Care Medicine

## 2019-09-27 ENCOUNTER — Other Ambulatory Visit: Payer: Self-pay

## 2019-09-27 DIAGNOSIS — R05 Cough: Secondary | ICD-10-CM | POA: Diagnosis not present

## 2019-09-27 DIAGNOSIS — R0989 Other specified symptoms and signs involving the circulatory and respiratory systems: Secondary | ICD-10-CM | POA: Diagnosis not present

## 2019-09-27 DIAGNOSIS — R059 Cough, unspecified: Secondary | ICD-10-CM

## 2019-09-28 ENCOUNTER — Telehealth: Payer: Self-pay | Admitting: Adult Health

## 2019-09-28 NOTE — Telephone Encounter (Signed)
Pt states he does not understand the results for the CT test he had done 3/25. He would like his PCP to call him to explain the results.   Pt can be reached at [phone]

## 2019-09-28 NOTE — Progress Notes (Signed)
Please let Marcus [name] know that his CT scan does not show scarring in the lung but does explain his coughing. We will discuss what this means when he comes back for his visit in April. If he is still coughing up a lot of sputum, he can pick up sample cups and bring back sputum samples for routine, AFB, and fungal cultures. Thanks!

## 2019-10-01 ENCOUNTER — Telehealth: Payer: Self-pay | Admitting: Critical Care Medicine

## 2019-10-01 NOTE — Telephone Encounter (Signed)
ATC patient and it goes straight to voicemail. Tried several times.

## 2019-10-01 NOTE — Telephone Encounter (Signed)
Pt returning call.  [phone]

## 2019-10-01 NOTE — Telephone Encounter (Signed)
LMTCB x 1 

## 2019-10-02 NOTE — Telephone Encounter (Signed)
ATC pt, no answer. Left message for pt to call back.  

## 2019-10-02 NOTE — Telephone Encounter (Signed)
Yes please move up the visit. The findings all cluster to suggest chronic infectious process. This is easier for me to show him looking at his scan than trying to further explain over the phone. My recommendations for collecting sputum samples is unchanged- that would still be very helpful.  Thanks!

## 2019-10-02 NOTE — Telephone Encounter (Signed)
Spoke with patient. His appointment has been moved up to 4/8. Patient is on his way to pick up sputum cups.  Nothing further needed at this time.

## 2019-10-02 NOTE — Telephone Encounter (Signed)
Patient called the office back to go over CT results. Patient has questions and concerns about it. Wants to know about the ground glass it talks about, wants to know why he has the cough since it didn't give details, etc.  I can move up his appointment from the 15th to 6th or the 8th if that is helpful? Just let me know  Patient wants to be called back on [phone]

## 2019-10-03 ENCOUNTER — Other Ambulatory Visit: Payer: Self-pay

## 2019-10-03 ENCOUNTER — Other Ambulatory Visit: Payer: Medicare HMO

## 2019-10-03 DIAGNOSIS — R05 Cough: Secondary | ICD-10-CM

## 2019-10-03 DIAGNOSIS — R059 Cough, unspecified: Secondary | ICD-10-CM

## 2019-10-03 NOTE — Progress Notes (Signed)
Patient dropped off sputum sample. Order placed. Nothing further needed

## 2019-10-03 NOTE — Addendum Note (Signed)
Addended by: Lia Foyer R on: 10/03/2019 12:25 PM   Modules accepted: Orders

## 2019-10-05 DIAGNOSIS — G4733 Obstructive sleep apnea (adult) (pediatric): Secondary | ICD-10-CM | POA: Diagnosis not present

## 2019-10-05 LAB — RESPIRATORY CULTURE OR RESPIRATORY AND SPUTUM CULTURE: MICRO NUMBER:: 10312821

## 2019-10-11 ENCOUNTER — Other Ambulatory Visit: Payer: Self-pay | Admitting: Adult Health

## 2019-10-11 ENCOUNTER — Ambulatory Visit: Payer: Medicare HMO | Admitting: Critical Care Medicine

## 2019-10-11 ENCOUNTER — Telehealth: Payer: Self-pay | Admitting: Critical Care Medicine

## 2019-10-11 ENCOUNTER — Encounter: Payer: Self-pay | Admitting: Critical Care Medicine

## 2019-10-11 ENCOUNTER — Other Ambulatory Visit: Payer: Self-pay

## 2019-10-11 VITALS — BP 122/60 | HR 62 | Temp 97.6°F | Ht 68.0 in | Wt 265.8 lb

## 2019-10-11 DIAGNOSIS — J479 Bronchiectasis, uncomplicated: Secondary | ICD-10-CM

## 2019-10-11 DIAGNOSIS — R05 Cough: Secondary | ICD-10-CM | POA: Diagnosis not present

## 2019-10-11 DIAGNOSIS — C61 Malignant neoplasm of prostate: Secondary | ICD-10-CM | POA: Diagnosis not present

## 2019-10-11 DIAGNOSIS — G4733 Obstructive sleep apnea (adult) (pediatric): Secondary | ICD-10-CM

## 2019-10-11 DIAGNOSIS — R053 Chronic cough: Secondary | ICD-10-CM

## 2019-10-11 MED ORDER — MUCINEX 600 MG PO TB12: 600.0000 mg | ORAL_TABLET | Freq: Two times a day (BID) | ORAL | 11 refills | Status: DC

## 2019-10-11 MED ORDER — SODIUM CHLORIDE 3 % IN NEBU: INHALATION_SOLUTION | Freq: Two times a day (BID) | RESPIRATORY_TRACT | 12 refills | Status: DC

## 2019-10-11 MED ORDER — FLUTTER DEVI: 1.0000 | Freq: Two times a day (BID) | 0 refills | Status: DC

## 2019-10-11 NOTE — Patient Instructions (Addendum)
Thank you for visiting Dr. Carlis Abbott at [facility]. We recommend the following: Orders Placed This Encounter  Procedures  . Ambulatory Referral for DME  . AMB REFERRAL FOR DME   Orders Placed This Encounter  Procedures  . Ambulatory Referral for DME    Referral Priority:   Routine    Referral Type:   Durable Medical Equipment Purchase    Number of Visits Requested:   1  . AMB REFERRAL FOR DME    Referral Priority:   Routine    Referral Type:   Durable Medical Equipment Purchase    Number of Visits Requested:   1   Use saline nebulizers two times daily followed by flutter valve ten times. This helps thin your mucus to make it easier to clear out of your lungs.   Meds ordered this encounter  Medications  . Respiratory Therapy Supplies (FLUTTER) DEVI    Sig: 1 each by Does not apply route in the morning and at bedtime.    Dispense:  1 each    Refill:  0  . sodium chloride HYPERTONIC 3 % nebulizer solution    Sig: Take by nebulization in the morning and at bedtime.    Dispense:  750 mL    Refill:  12  . guaiFENesin (MUCINEX) 600 MG 12 hr tablet    Sig: Take 1 tablet (600 mg total) by mouth 2 (two) times daily.    Dispense:  60 tablet    Refill:  11    Return in about 6 weeks (around 11/22/2019).    Please do your part to reduce the spread of COVID-19.

## 2019-10-11 NOTE — Telephone Encounter (Signed)
Please schedule the following:  Diagnosis: abnormal CT scan, atypical infection Procedure: video bronchoscopy with bronchoalveolar lavage Date: any day the week of 3/26-3/30 Alternate Date: see above Time: AM/ PM either Location: [facility]  (unless they can do it at [facility]) Medication Restriction:  none Anticoagulate/Antiplatelet: none Pre-op Labs Ordered: none Imaging request: none   Please coordinate Pre-op COVID Testing.  Thanks!  Julian Hy, DO 10/11/19 12:27 PM [facility]

## 2019-10-11 NOTE — H&P (View-Only) (Signed)
Synopsis: Referred in March 2021 for cough by Dorothyann Peng, NP.  Subjective:   PATIENT ID: Marcus [name] GENDER: male DOB: 11/26/1940, MRN: [id number]  Chief Complaint  Patient presents with  . Follow-up    Marcus [name] is a 79 y/o gentleman being seen in follow up of chronic cough with sputum production.  He is here to follow-up with his CT.  He was able to bring in a sputum sample, but unfortunately it was an inadequate sample.  No significant change in symptoms since his last visit.  No fevers or sweats, but he endorses occasional episodes of shaking chills that happened a few times a year for the last 60 years since he had malaria while he was in the [employer].  He uses CPAP but does not regularly like to clean his CPAP supplies.  He has never had his hoses and other disposable equipment replaced.  He sleeps on his right side.     OV 09/20/19: Marcus [name] is a 79 y/o gentleman with a history of obesity s/p bariatric surgery in 1980s, OSA on CPAP, and GERD who presents for evalaution of chronic cough.  His cough has been persistent but not worsening for the past 2 years.  It is associated with thick yellow sputum production.  No shortness of breath or wheezing.  The coughing is throughout the day every day.  It is worse when he has significant episodes of regurgitation, which happens about once a month if he eats dinner too late.  He will wake up at night after having regurgitation and he is coughing up regurgitated material for about 2 hours, sometimes including food particles.  He has a remote history of pneumonia and has had bronchitis a few times.  No previous history of lung disease.  No family history of lung disease.  He has not been on inhalers.  He was recently seen by Dr. Lucia Gaskins from ENT on 08/28/2019 (note reviewed) who felt that his cough was unrelated to postnasal drip.  He had a sinus CT did not demonstrate any ongoing sinus issues.  He recommended starting Flonase, which the  patient has not been taking.  He quit smoking 45 years ago, but previously had smoked 2 packs/day for 20 years.  He is not on an ACE inhibitor.  He has previously been managed by Dr. Carlean Purl from GI, last visit in 08/2018 (note reviewed).  He has a history of GERD and delayed gastric emptying.  He is previously required dilation of a stricture in the distal stomach.  He prescribed Reglan, which Marcus [name] is not taking.  He is on omeprazole daily.  He does not have episodes of heartburn, only the episodes of regurgitation.    Past Medical History:  Diagnosis Date  . Acute meniscal tear of knee left  . Arthritis    generalized  . Calcaneal spur   . Colon cancer [facility]) 1983   surgical removed. did not receive radiation or chemotherapy.  . Coronary artery disease    dr Stanford Breed  . Cramps of lower extremity    in the mornings occ.  . Depression   . Diverticulosis of colon (without mention of hemorrhage)   . Echocardiogram findings abnormal, without diagnosis 04-23-2010   normal LV function, mod. left atrial enlargement, mild right artrial enlargement and grade 1 diastolic dysfunction  . Edema of lower extremity    ankles, elevates legs  . Elevated prostate specific antigen (PSA)   . Esophageal reflux   . External  hemorrhoids   . Frequency of urination    followed by dr dalhstedt  . Gout   . Hiatal hernia   . History of colon cancer 1988  . Hyperlipidemia   . Lumbar spondylolysis   . Malaria 1964  . Normal nuclear stress test 04-23-2010   ef 51%,  septal hypokinesis, normal perfusion  . OSA (obstructive sleep apnea)    does not use CPAP   . Paraesophageal hernia    large  . Personal history of colonic polyps 08/21/2009   TUBULAR ADENOMAS  . Pneumonia    history of; last episode 2015  . Postgastric surgery syndromes   . Presence of permanent cardiac pacemaker   . Prostate cancer ([place]) 2016  . Unspecified essential hypertension   . Unspecified vitamin D deficiency   . Ventral  hernia   . Vitamin B12 deficiency      Family History  Problem Relation Age of Onset  . Stroke Paternal Grandfather   . Heart disease Paternal Grandfather   . Esophagitis Father        died from perforated esophagus  . Breast cancer Mother   . Colon cancer Maternal Grandmother        questionable     Past Surgical History:  Procedure Laterality Date  . CARDIAC CATHETERIZATION    . CATARACT EXTRACTION W/ INTRAOCULAR LENS  IMPLANT, BILATERAL Bilateral   . COLONOSCOPY    . ESOPHAGOGASTRODUODENOSCOPY    . KNEE ARTHROSCOPY Right 2007  . KNEE ARTHROSCOPY  07/13/2011   Procedure: ARTHROSCOPY KNEE;  Surgeon: Cynda Familia;  Location: [facility];  Service: Orthopedics;  Laterality: Left;  partial menisectomy with chondrylplasty  . KNEE SURGERY Left   . LAMINECTOMY AND MICRODISCECTOMY LUMBAR SPINE  MARCH  2008   L3 -  4  . LAPAROSCOPIC INCISIONAL / UMBILICAL / VENTRAL HERNIA REPAIR  2006  . PACEMAKER IMPLANT N/A 08/02/2018   Procedure: PACEMAKER IMPLANT;  Surgeon: Evans Lance, MD;  Location: [facility];  Service: Cardiovascular;  Laterality: N/A;  . PROSTATE BIOPSY    . RADIOACTIVE SEED IMPLANT N/A 02/12/2019   Procedure: RADIOACTIVE SEED IMPLANT/BRACHYTHERAPY IMPLANT;  Surgeon: Franchot Gallo, MD;  Location: [facility];  Service: Urology;  Laterality: N/A;  90 MINS  . SHOULDER ARTHROSCOPY Right 05-23-2007  . SIGMOID COLECTOMY FOR CANCER  1989  . SPACE OAR INSTILLATION N/A 02/12/2019   Procedure: SPACE OAR INSTILLATION;  Surgeon: Franchot Gallo, MD;  Location: [facility];  Service: Urology;  Laterality: N/A;  . TOTAL KNEE ARTHROPLASTY Left 05/23/2015   Procedure: LEFT TOTAL KNEE ARTHROPLASTY;  Surgeon: Sydnee Cabal, MD;  Location: [facility];  Service: Orthopedics;  Laterality: Left;  [name] Kitchen VERTICAL BANDED GASTROPLASTY  1986    Social History   Socioeconomic History  . Marital status: Married    Spouse name: Not on file  . Number of children: 4  . Years  of education: Not on file  . Highest education level: Not on file  Occupational History  . Occupation: retired    Fish farm manager: [employer]  Tobacco Use  . Smoking status: Former Smoker    Packs/day: 2.00    Years: 20.00    Pack years: 40.00    Types: Cigarettes    Quit date: 07/05/1974    Years since quitting: 45.2  . Smokeless tobacco: Never Used  Substance and Sexual Activity  . Alcohol use: Yes    Alcohol/week: 0.0 standard drinks    Comment: socially; also 1-2 glasses  of wine or beer every other day   . Drug use: No  . Sexual activity: Not Currently  Other Topics Concern  . Not on file  Social History Narrative   Recently widowed   Former Smoker    Alcohol use-yes 1-2 drinks per day      Occupation: Retired Midwife      Originally from [place], [place] - in [place] > 10 yrs as of 2017      Social Determinants of Health   Financial Resource Strain:   . Difficulty of Paying Living Expenses:   Food Insecurity:   . Worried About Charity fundraiser in the Last Year:   . Arboriculturist in the Last Year:   Transportation Needs:   . Film/video editor (Medical):   [name] Kitchen Lack of Transportation (Non-Medical):   Physical Activity:   . Days of Exercise per Week:   . Minutes of Exercise per Session:   Stress:   . Feeling of Stress :   Social Connections:   . Frequency of Communication with Friends and Family:   . Frequency of Social Gatherings with Friends and Family:   . Attends Religious Services:   . Active Member of Clubs or Organizations:   . Attends Archivist Meetings:   [name] Kitchen Marital Status:   Intimate Partner Violence:   . Fear of Current or Ex-Partner:   . Emotionally Abused:   [name] Kitchen Physically Abused:   . Sexually Abused:      Allergies  Allergen Reactions  . Contrast Media [Iodinated Diagnostic Agents] Palpitations    TACHYCARDIA- patient states he has tolerated newer agents since this reaction >30 yrs ago     Immunization History  Administered  Date(s) Administered  . Influenza Whole 06/05/2007, 04/30/2008, 03/31/2009  . Influenza, High Dose Seasonal PF 06/19/2014, 07/30/2015, 03/03/2017  . Influenza,inj,quad, With Preservative 04/04/2017  . PFIZER SARS-COV-2 Vaccination 08/13/2019, 09/11/2019  . Pneumococcal Conjugate-13 06/19/2014, 07/30/2015  . Pneumococcal Polysaccharide-23 12/11/2003, 03/03/2017  . Td 03/31/2009    Outpatient Medications Prior to Visit  Medication Sig Dispense Refill  . allopurinol (ZYLOPRIM) 300 MG tablet TAKE 1 TABLET EVERY DAY 90 tablet 1  . amLODipine (NORVASC) 10 MG tablet TAKE 1 TABLET EVERY DAY 90 tablet 1  . Cyanocobalamin (VITAMIN B-12) 5000 MCG TBDP Take 1 tablet by mouth daily.     [name] Calcium (STOOL SOFTENER PO) Take 1 tablet by mouth daily.    . magnesium oxide (MAG-OX) 400 MG tablet Take 400 mg by mouth daily.    . simvastatin (ZOCOR) 20 MG tablet Take 1 tablet (20 mg total) by mouth at bedtime. 90 tablet 1  . tamsulosin (FLOMAX) 0.4 MG CAPS capsule Take 0.4 mg by mouth daily.     [name] Kitchen oxymetazoline (AFRIN) 0.05 % nasal spray Place 1 spray into both nostrils at bedtime.    . Calcium Carb-Cholecalciferol (CALCIUM 600 + D PO) Take 1 tablet by mouth daily.    . cetirizine (ZYRTEC) 10 MG tablet Take 10 mg by mouth daily.    [name] Kitchen omeprazole (PRILOSEC) 40 MG capsule Take 1 capsule (40 mg total) by mouth daily. (Patient not taking: Reported on 10/11/2019) 90 capsule 1  . valsartan-hydrochlorothiazide (DIOVAN-HCT) 320-25 MG tablet TAKE 1 TABLET EVERY DAY (Patient not taking: Reported on 10/11/2019) 90 tablet 1   No facility-administered medications prior to visit.    Review of Systems  Constitutional: Negative for chills and fever.  HENT: Negative for congestion.  No postnasal drip  Respiratory: Positive for cough and sputum production. Negative for hemoptysis, shortness of breath and wheezing.   Cardiovascular: Negative.   Gastrointestinal: Positive for heartburn.  Skin: Negative for rash.       Objective:   Vitals:   10/11/19 1200  BP: 122/60  Pulse: 62  Temp: 97.6 F (36.4 C)  TempSrc: Temporal  SpO2: 95%  Weight: 265 lb 12.8 oz (120.6 kg)  Height: _0  (1.727 m)   95% on   RA BMI Readings from Last 3 Encounters:  10/11/19 40.41 kg/m  09/20/19 40.51 kg/m  05/18/19 39.53 kg/m   Wt Readings from Last 3 Encounters:  10/11/19 265 lb 12.8 oz (120.6 kg)  09/20/19 266 lb 6.4 oz (120.8 kg)  05/18/19 260 lb (117.9 kg)    Physical Exam Vitals reviewed.  Constitutional:      Appearance: Normal appearance. He is obese. He is not ill-appearing.  HENT:     Head: Normocephalic and atraumatic.  Eyes:     General: No scleral icterus. Cardiovascular:     Rate and Rhythm: Normal rate and regular rhythm.     Heart sounds: No murmur.  Pulmonary:     Comments: breathing comfortably on RA, no conversational dyspnea or witnessed coughing. CTAB. Musculoskeletal:        General: No swelling or deformity.     Cervical back: Neck supple.  Lymphadenopathy:     Cervical: No cervical adenopathy.  Skin:    General: Skin is warm and dry.     Findings: No rash.  Neurological:     General: No focal deficit present.     Mental Status: He is alert.     Coordination: Coordination normal.  Psychiatric:        Mood and Affect: Mood normal.        Behavior: Behavior normal.      CBC    Component Value Date/Time   WBC 6.2 02/07/2019 1446   RBC 4.02 (L) 02/07/2019 1446   HGB 11.8 (L) 02/07/2019 1446   HCT 37.3 (L) 02/07/2019 1446   PLT 230 02/07/2019 1446   MCV 92.8 02/07/2019 1446   MCH 29.4 02/07/2019 1446   MCHC 31.6 02/07/2019 1446   RDW 14.9 02/07/2019 1446   LYMPHSABS 1.5 07/12/2018 1036   MONOABS 0.6 07/12/2018 1036   EOSABS 0.1 07/12/2018 1036   BASOSABS 0.0 07/12/2018 1036    CHEMISTRY No results for input(s): NA, K, CL, CO2, GLUCOSE, BUN, CREATININE, CALCIUM, MG, PHOS in the last 168 hours. CrCl cannot be calculated (Patient's most recent lab result  is older than the maximum 21 days allowed.).   Chest Imaging- films reviewed: CXR, 2 view 08/21/2019-elevated left hemidiaphragm, pacemaker in place.  Mild kyphosis with flattened hemidiaphragms most notable posteriorly.  CT chest 01/17/2015-lung parenchyma, no significant mediastinal adenopathy.  Senescent vascular calcification.  HRCT chest 09/27/19- Scattered groundglass opacities and tree-in-bud opacities.  Bilateral basilar linear scars.  Mild bronchiectasis bilateral lower lobes, right middle lobe, right upper lobe, lingula.  Minimal air-trapping.  No significant mediastinal or hilar adenopathy.  Likely small hiatal hernia.  Aortic valve, three-vessel coronary, and aortic calcifications.  Pulmonary Functions Testing Results: No flowsheet data found.   Echocardiogram 07/24/18: LVEF 55 to 84%, grade 1 diastolic dysfunction, mildly dilated ascending aorta.  Leg, RV, RA.  Trivial  and PR, otherwise normal valves.MR      Assessment & Plan:     ICD-10-CM   1. Chronic cough  R05 AMB REFERRAL FOR  DME  2. Bronchiectasis without complication ([place])  F47.3   3. OSA (obstructive sleep apnea)  G47.33 Ambulatory Referral for DME    Chronic cough likely due to chronic atypical infection. Minimal bronchiectasis and abnormalities to suggest chronic infection on CT scan. Alternatively, it is possible that the GGO and micronodules are related to aspiration pneumonitis. -HRCT chest reviewed today. -Recommend bronchoscopy with BAL to evaluate for atypical infection.  This will be scheduled in the next few weeks. -Recommend adding hypertonic saline with flutter valve twice daily to help with secretion clearance. -Add guaifenesin twice daily -I am concerned that his chronic regurgitation episodes are likely to continue to propagate this process.  GERD; h/o bariatric surgery -Continue PPI -Continue GERD lifestyle modifications  RTC in 6 weeks.   Current Outpatient Medications:  .  allopurinol  (ZYLOPRIM) 300 MG tablet, TAKE 1 TABLET EVERY DAY, Disp: 90 tablet, Rfl: 1 .  amLODipine (NORVASC) 10 MG tablet, TAKE 1 TABLET EVERY DAY, Disp: 90 tablet, Rfl: 1 .  Cyanocobalamin (VITAMIN B-12) 5000 MCG TBDP, Take 1 tablet by mouth daily. , Disp: , Rfl:  .  Docusate Calcium (STOOL SOFTENER PO), Take 1 tablet by mouth daily., Disp: , Rfl:  .  magnesium oxide (MAG-OX) 400 MG tablet, Take 400 mg by mouth daily., Disp: , Rfl:  .  simvastatin (ZOCOR) 20 MG tablet, Take 1 tablet (20 mg total) by mouth at bedtime., Disp: 90 tablet, Rfl: 1 .  tamsulosin (FLOMAX) 0.4 MG CAPS capsule, Take 0.4 mg by mouth daily. , Disp: , Rfl:  .  Calcium Carb-Cholecalciferol (CALCIUM 600 + D PO), Take 1 tablet by mouth daily., Disp: , Rfl:  .  cetirizine (ZYRTEC) 10 MG tablet, Take 10 mg by mouth daily., Disp: , Rfl:  .  guaiFENesin (MUCINEX) 600 MG 12 hr tablet, Take 1 tablet (600 mg total) by mouth 2 (two) times daily., Disp: 60 tablet, Rfl: 11 .  omeprazole (PRILOSEC) 40 MG capsule, Take 1 capsule (40 mg total) by mouth daily. (Patient not taking: Reported on 10/11/2019), Disp: 90 capsule, Rfl: 1 .  Respiratory Therapy Supplies (FLUTTER) DEVI, 1 each by Does not apply route in the morning and at bedtime., Disp: 1 each, Rfl: 0 .  sodium chloride HYPERTONIC 3 % nebulizer solution, Take by nebulization in the morning and at bedtime., Disp: 750 mL, Rfl: 12 .  valsartan-hydrochlorothiazide (DIOVAN-HCT) 320-25 MG tablet, TAKE 1 TABLET EVERY DAY (Patient not taking: Reported on 10/11/2019), Disp: 90 tablet, Rfl: Riner Lesha Jager, DO [facility] 10/11/2019 12:35 PM

## 2019-10-11 NOTE — Progress Notes (Signed)
Synopsis: Referred in March 2021 for cough by Dorothyann Peng, NP.  Subjective:   PATIENT ID: Marcus [name] GENDER: male DOB: 11/26/1940, MRN: [id number]  Chief Complaint  Patient presents with  . Follow-up    Marcus [name] is a 79 y/o gentleman being seen in follow up of chronic cough with sputum production.  He is here to follow-up with his CT.  He was able to bring in a sputum sample, but unfortunately it was an inadequate sample.  No significant change in symptoms since his last visit.  No fevers or sweats, but he endorses occasional episodes of shaking chills that happened a few times a year for the last 60 years since he had malaria while he was in the [employer].  He uses CPAP but does not regularly like to clean his CPAP supplies.  He has never had his hoses and other disposable equipment replaced.  He sleeps on his right side.     OV 09/20/19: Marcus [name] is a 79 y/o gentleman with a history of obesity s/p bariatric surgery in 1980s, OSA on CPAP, and GERD who presents for evalaution of chronic cough.  His cough has been persistent but not worsening for the past 2 years.  It is associated with thick yellow sputum production.  No shortness of breath or wheezing.  The coughing is throughout the day every day.  It is worse when he has significant episodes of regurgitation, which happens about once a month if he eats dinner too late.  He will wake up at night after having regurgitation and he is coughing up regurgitated material for about 2 hours, sometimes including food particles.  He has a remote history of pneumonia and has had bronchitis a few times.  No previous history of lung disease.  No family history of lung disease.  He has not been on inhalers.  He was recently seen by Dr. Lucia Gaskins from ENT on 08/28/2019 (note reviewed) who felt that his cough was unrelated to postnasal drip.  He had a sinus CT did not demonstrate any ongoing sinus issues.  He recommended starting Flonase, which the  patient has not been taking.  He quit smoking 45 years ago, but previously had smoked 2 packs/day for 20 years.  He is not on an ACE inhibitor.  He has previously been managed by Dr. Carlean Purl from GI, last visit in 08/2018 (note reviewed).  He has a history of GERD and delayed gastric emptying.  He is previously required dilation of a stricture in the distal stomach.  He prescribed Reglan, which Marcus [name] is not taking.  He is on omeprazole daily.  He does not have episodes of heartburn, only the episodes of regurgitation.    Past Medical History:  Diagnosis Date  . Acute meniscal tear of knee left  . Arthritis    generalized  . Calcaneal spur   . Colon cancer [facility]) 1983   surgical removed. did not receive radiation or chemotherapy.  . Coronary artery disease    dr Stanford Breed  . Cramps of lower extremity    in the mornings occ.  . Depression   . Diverticulosis of colon (without mention of hemorrhage)   . Echocardiogram findings abnormal, without diagnosis 04-23-2010   normal LV function, mod. left atrial enlargement, mild right artrial enlargement and grade 1 diastolic dysfunction  . Edema of lower extremity    ankles, elevates legs  . Elevated prostate specific antigen (PSA)   . Esophageal reflux   . External  hemorrhoids   . Frequency of urination    followed by dr dalhstedt  . Gout   . Hiatal hernia   . History of colon cancer 1988  . Hyperlipidemia   . Lumbar spondylolysis   . Malaria 1964  . Normal nuclear stress test 04-23-2010   ef 51%,  septal hypokinesis, normal perfusion  . OSA (obstructive sleep apnea)    does not use CPAP   . Paraesophageal hernia    large  . Personal history of colonic polyps 08/21/2009   TUBULAR ADENOMAS  . Pneumonia    history of; last episode 2015  . Postgastric surgery syndromes   . Presence of permanent cardiac pacemaker   . Prostate cancer ([place]) 2016  . Unspecified essential hypertension   . Unspecified vitamin D deficiency   . Ventral  hernia   . Vitamin B12 deficiency      Family History  Problem Relation Age of Onset  . Stroke Paternal Grandfather   . Heart disease Paternal Grandfather   . Esophagitis Father        died from perforated esophagus  . Breast cancer Mother   . Colon cancer Maternal Grandmother        questionable     Past Surgical History:  Procedure Laterality Date  . CARDIAC CATHETERIZATION    . CATARACT EXTRACTION W/ INTRAOCULAR LENS  IMPLANT, BILATERAL Bilateral   . COLONOSCOPY    . ESOPHAGOGASTRODUODENOSCOPY    . KNEE ARTHROSCOPY Right 2007  . KNEE ARTHROSCOPY  07/13/2011   Procedure: ARTHROSCOPY KNEE;  Surgeon: Cynda Familia;  Location: [facility];  Service: Orthopedics;  Laterality: Left;  partial menisectomy with chondrylplasty  . KNEE SURGERY Left   . LAMINECTOMY AND MICRODISCECTOMY LUMBAR SPINE  MARCH  2008   L3 -  4  . LAPAROSCOPIC INCISIONAL / UMBILICAL / VENTRAL HERNIA REPAIR  2006  . PACEMAKER IMPLANT N/A 08/02/2018   Procedure: PACEMAKER IMPLANT;  Surgeon: Evans Lance, MD;  Location: [facility];  Service: Cardiovascular;  Laterality: N/A;  . PROSTATE BIOPSY    . RADIOACTIVE SEED IMPLANT N/A 02/12/2019   Procedure: RADIOACTIVE SEED IMPLANT/BRACHYTHERAPY IMPLANT;  Surgeon: Franchot Gallo, MD;  Location: [facility];  Service: Urology;  Laterality: N/A;  90 MINS  . SHOULDER ARTHROSCOPY Right 05-23-2007  . SIGMOID COLECTOMY FOR CANCER  1989  . SPACE OAR INSTILLATION N/A 02/12/2019   Procedure: SPACE OAR INSTILLATION;  Surgeon: Franchot Gallo, MD;  Location: [facility];  Service: Urology;  Laterality: N/A;  . TOTAL KNEE ARTHROPLASTY Left 05/23/2015   Procedure: LEFT TOTAL KNEE ARTHROPLASTY;  Surgeon: Sydnee Cabal, MD;  Location: [facility];  Service: Orthopedics;  Laterality: Left;  [name] Kitchen VERTICAL BANDED GASTROPLASTY  1986    Social History   Socioeconomic History  . Marital status: Married    Spouse name: Not on file  . Number of children: 4  . Years  of education: Not on file  . Highest education level: Not on file  Occupational History  . Occupation: retired    Fish farm manager: [employer]  Tobacco Use  . Smoking status: Former Smoker    Packs/day: 2.00    Years: 20.00    Pack years: 40.00    Types: Cigarettes    Quit date: 07/05/1974    Years since quitting: 45.2  . Smokeless tobacco: Never Used  Substance and Sexual Activity  . Alcohol use: Yes    Alcohol/week: 0.0 standard drinks    Comment: socially; also 1-2 glasses  of wine or beer every other day   . Drug use: No  . Sexual activity: Not Currently  Other Topics Concern  . Not on file  Social History Narrative   Recently widowed   Former Smoker    Alcohol use-yes 1-2 drinks per day      Occupation: Retired Midwife      Originally from [place], [place] - in [place] > 10 yrs as of 2017      Social Determinants of Health   Financial Resource Strain:   . Difficulty of Paying Living Expenses:   Food Insecurity:   . Worried About Charity fundraiser in the Last Year:   . Arboriculturist in the Last Year:   Transportation Needs:   . Film/video editor (Medical):   [name] Kitchen Lack of Transportation (Non-Medical):   Physical Activity:   . Days of Exercise per Week:   . Minutes of Exercise per Session:   Stress:   . Feeling of Stress :   Social Connections:   . Frequency of Communication with Friends and Family:   . Frequency of Social Gatherings with Friends and Family:   . Attends Religious Services:   . Active Member of Clubs or Organizations:   . Attends Archivist Meetings:   [name] Kitchen Marital Status:   Intimate Partner Violence:   . Fear of Current or Ex-Partner:   . Emotionally Abused:   [name] Kitchen Physically Abused:   . Sexually Abused:      Allergies  Allergen Reactions  . Contrast Media [Iodinated Diagnostic Agents] Palpitations    TACHYCARDIA- patient states he has tolerated newer agents since this reaction >30 yrs ago     Immunization History  Administered  Date(s) Administered  . Influenza Whole 06/05/2007, 04/30/2008, 03/31/2009  . Influenza, High Dose Seasonal PF 06/19/2014, 07/30/2015, 03/03/2017  . Influenza,inj,quad, With Preservative 04/04/2017  . PFIZER SARS-COV-2 Vaccination 08/13/2019, 09/11/2019  . Pneumococcal Conjugate-13 06/19/2014, 07/30/2015  . Pneumococcal Polysaccharide-23 12/11/2003, 03/03/2017  . Td 03/31/2009    Outpatient Medications Prior to Visit  Medication Sig Dispense Refill  . allopurinol (ZYLOPRIM) 300 MG tablet TAKE 1 TABLET EVERY DAY 90 tablet 1  . amLODipine (NORVASC) 10 MG tablet TAKE 1 TABLET EVERY DAY 90 tablet 1  . Cyanocobalamin (VITAMIN B-12) 5000 MCG TBDP Take 1 tablet by mouth daily.     [name] Calcium (STOOL SOFTENER PO) Take 1 tablet by mouth daily.    . magnesium oxide (MAG-OX) 400 MG tablet Take 400 mg by mouth daily.    . simvastatin (ZOCOR) 20 MG tablet Take 1 tablet (20 mg total) by mouth at bedtime. 90 tablet 1  . tamsulosin (FLOMAX) 0.4 MG CAPS capsule Take 0.4 mg by mouth daily.     [name] Kitchen oxymetazoline (AFRIN) 0.05 % nasal spray Place 1 spray into both nostrils at bedtime.    . Calcium Carb-Cholecalciferol (CALCIUM 600 + D PO) Take 1 tablet by mouth daily.    . cetirizine (ZYRTEC) 10 MG tablet Take 10 mg by mouth daily.    [name] Kitchen omeprazole (PRILOSEC) 40 MG capsule Take 1 capsule (40 mg total) by mouth daily. (Patient not taking: Reported on 10/11/2019) 90 capsule 1  . valsartan-hydrochlorothiazide (DIOVAN-HCT) 320-25 MG tablet TAKE 1 TABLET EVERY DAY (Patient not taking: Reported on 10/11/2019) 90 tablet 1   No facility-administered medications prior to visit.    Review of Systems  Constitutional: Negative for chills and fever.  HENT: Negative for congestion.  No postnasal drip  Respiratory: Positive for cough and sputum production. Negative for hemoptysis, shortness of breath and wheezing.   Cardiovascular: Negative.   Gastrointestinal: Positive for heartburn.  Skin: Negative for rash.       Objective:   Vitals:   10/11/19 1200  BP: 122/60  Pulse: 62  Temp: 97.6 F (36.4 C)  TempSrc: Temporal  SpO2: 95%  Weight: 265 lb 12.8 oz (120.6 kg)  Height: _0  (1.727 m)   95% on   RA BMI Readings from Last 3 Encounters:  10/11/19 40.41 kg/m  09/20/19 40.51 kg/m  05/18/19 39.53 kg/m   Wt Readings from Last 3 Encounters:  10/11/19 265 lb 12.8 oz (120.6 kg)  09/20/19 266 lb 6.4 oz (120.8 kg)  05/18/19 260 lb (117.9 kg)    Physical Exam Vitals reviewed.  Constitutional:      Appearance: Normal appearance. He is obese. He is not ill-appearing.  HENT:     Head: Normocephalic and atraumatic.  Eyes:     General: No scleral icterus. Cardiovascular:     Rate and Rhythm: Normal rate and regular rhythm.     Heart sounds: No murmur.  Pulmonary:     Comments: breathing comfortably on RA, no conversational dyspnea or witnessed coughing. CTAB. Musculoskeletal:        General: No swelling or deformity.     Cervical back: Neck supple.  Lymphadenopathy:     Cervical: No cervical adenopathy.  Skin:    General: Skin is warm and dry.     Findings: No rash.  Neurological:     General: No focal deficit present.     Mental Status: He is alert.     Coordination: Coordination normal.  Psychiatric:        Mood and Affect: Mood normal.        Behavior: Behavior normal.      CBC    Component Value Date/Time   WBC 6.2 02/07/2019 1446   RBC 4.02 (L) 02/07/2019 1446   HGB 11.8 (L) 02/07/2019 1446   HCT 37.3 (L) 02/07/2019 1446   PLT 230 02/07/2019 1446   MCV 92.8 02/07/2019 1446   MCH 29.4 02/07/2019 1446   MCHC 31.6 02/07/2019 1446   RDW 14.9 02/07/2019 1446   LYMPHSABS 1.5 07/12/2018 1036   MONOABS 0.6 07/12/2018 1036   EOSABS 0.1 07/12/2018 1036   BASOSABS 0.0 07/12/2018 1036    CHEMISTRY No results for input(s): NA, K, CL, CO2, GLUCOSE, BUN, CREATININE, CALCIUM, MG, PHOS in the last 168 hours. CrCl cannot be calculated (Patient's most recent lab result  is older than the maximum 21 days allowed.).   Chest Imaging- films reviewed: CXR, 2 view 08/21/2019-elevated left hemidiaphragm, pacemaker in place.  Mild kyphosis with flattened hemidiaphragms most notable posteriorly.  CT chest 01/17/2015-lung parenchyma, no significant mediastinal adenopathy.  Senescent vascular calcification.  HRCT chest 09/27/19- Scattered groundglass opacities and tree-in-bud opacities.  Bilateral basilar linear scars.  Mild bronchiectasis bilateral lower lobes, right middle lobe, right upper lobe, lingula.  Minimal air-trapping.  No significant mediastinal or hilar adenopathy.  Likely small hiatal hernia.  Aortic valve, three-vessel coronary, and aortic calcifications.  Pulmonary Functions Testing Results: No flowsheet data found.   Echocardiogram 07/24/18: LVEF 55 to 84%, grade 1 diastolic dysfunction, mildly dilated ascending aorta.  Leg, RV, RA.  Trivial  and PR, otherwise normal valves.MR      Assessment & Plan:     ICD-10-CM   1. Chronic cough  R05 AMB REFERRAL FOR  DME  2. Bronchiectasis without complication ([place])  W40.9   3. OSA (obstructive sleep apnea)  G47.33 Ambulatory Referral for DME    Chronic cough likely due to chronic atypical infection. Minimal bronchiectasis and abnormalities to suggest chronic infection on CT scan. Alternatively, it is possible that the GGO and micronodules are related to aspiration pneumonitis. -HRCT chest reviewed today. -Recommend bronchoscopy with BAL to evaluate for atypical infection.  This will be scheduled in the next few weeks. -Recommend adding hypertonic saline with flutter valve twice daily to help with secretion clearance. -Add guaifenesin twice daily -I am concerned that his chronic regurgitation episodes are likely to continue to propagate this process.  GERD; h/o bariatric surgery -Continue PPI -Continue GERD lifestyle modifications  RTC in 6 weeks.   Current Outpatient Medications:  .  allopurinol  (ZYLOPRIM) 300 MG tablet, TAKE 1 TABLET EVERY DAY, Disp: 90 tablet, Rfl: 1 .  amLODipine (NORVASC) 10 MG tablet, TAKE 1 TABLET EVERY DAY, Disp: 90 tablet, Rfl: 1 .  Cyanocobalamin (VITAMIN B-12) 5000 MCG TBDP, Take 1 tablet by mouth daily. , Disp: , Rfl:  .  Docusate Calcium (STOOL SOFTENER PO), Take 1 tablet by mouth daily., Disp: , Rfl:  .  magnesium oxide (MAG-OX) 400 MG tablet, Take 400 mg by mouth daily., Disp: , Rfl:  .  simvastatin (ZOCOR) 20 MG tablet, Take 1 tablet (20 mg total) by mouth at bedtime., Disp: 90 tablet, Rfl: 1 .  tamsulosin (FLOMAX) 0.4 MG CAPS capsule, Take 0.4 mg by mouth daily. , Disp: , Rfl:  .  Calcium Carb-Cholecalciferol (CALCIUM 600 + D PO), Take 1 tablet by mouth daily., Disp: , Rfl:  .  cetirizine (ZYRTEC) 10 MG tablet, Take 10 mg by mouth daily., Disp: , Rfl:  .  guaiFENesin (MUCINEX) 600 MG 12 hr tablet, Take 1 tablet (600 mg total) by mouth 2 (two) times daily., Disp: 60 tablet, Rfl: 11 .  omeprazole (PRILOSEC) 40 MG capsule, Take 1 capsule (40 mg total) by mouth daily. (Patient not taking: Reported on 10/11/2019), Disp: 90 capsule, Rfl: 1 .  Respiratory Therapy Supplies (FLUTTER) DEVI, 1 each by Does not apply route in the morning and at bedtime., Disp: 1 each, Rfl: 0 .  sodium chloride HYPERTONIC 3 % nebulizer solution, Take by nebulization in the morning and at bedtime., Disp: 750 mL, Rfl: 12 .  valsartan-hydrochlorothiazide (DIOVAN-HCT) 320-25 MG tablet, TAKE 1 TABLET EVERY DAY (Patient not taking: Reported on 10/11/2019), Disp: 90 tablet, Rfl: Kings Verna Desrocher, DO [facility] 10/11/2019 12:35 PM

## 2019-10-12 ENCOUNTER — Telehealth: Payer: Self-pay | Admitting: Critical Care Medicine

## 2019-10-12 NOTE — Telephone Encounter (Signed)
Pt returning a a phone call. Pt can be reached [phone].

## 2019-10-12 NOTE — Telephone Encounter (Signed)
ATC pt, no answer. Left message for pt to call back.  

## 2019-10-12 NOTE — Telephone Encounter (Signed)
Meds ordered this encounter during OV 10/11/19  Medications  . Respiratory Therapy Supplies (FLUTTER) DEVI    Sig: 1 each by Does not apply route in the morning and at bedtime.    Dispense:  1 each    Refill:  0  . sodium chloride HYPERTONIC 3 % nebulizer solution    Sig: Take by nebulization in the morning and at bedtime.    Dispense:  750 mL    Refill:  12  . guaiFENesin (MUCINEX) 600 MG 12 hr tablet    Sig: Take 1 tablet (600 mg total) by mouth 2 (two) times daily.    Dispense:  60 tablet    Refill:  11    Return in about 6 weeks (around 11/22/2019).   Attempted to call pt but line went straight to VM. Left message for pt to return call.

## 2019-10-12 NOTE — Telephone Encounter (Signed)
Pt returning a a phone call. Pt can be reached [phone].

## 2019-10-12 NOTE — Telephone Encounter (Signed)
Yes, good catch- April. Thanks!

## 2019-10-12 NOTE — Telephone Encounter (Signed)
I called and spoke with the patient and made him aware of how to use his nebulized medications and the number of times per day. He verbalized understanding.

## 2019-10-15 DIAGNOSIS — J449 Chronic obstructive pulmonary disease, unspecified: Secondary | ICD-10-CM | POA: Diagnosis not present

## 2019-10-15 DIAGNOSIS — J479 Bronchiectasis, uncomplicated: Secondary | ICD-10-CM | POA: Diagnosis not present

## 2019-10-15 NOTE — Telephone Encounter (Signed)
Called and spoke with patient. Let them know their Bronch is scheduled for 10/30/19 at [facility] with Dr. Carlis Abbott at 1400.  Patient was instructed to arrive at hospital at 1230. They were instructed to bring someone with them as they will not be able to drive home from procedure. Patient instructed not to have anything to eat or drink after midnight. No blood thinner to hold.  Patient's covid screening is scheduled at [facility] for Bronch at 0900.  Patient voiced understanding, nothing further needed  Routing to Dr. Carlis Abbott as FYI CASE ID: [id number]

## 2019-10-16 NOTE — Telephone Encounter (Signed)
Thanks

## 2019-10-18 ENCOUNTER — Ambulatory Visit: Payer: Medicare HMO | Admitting: Critical Care Medicine

## 2019-10-19 ENCOUNTER — Encounter (HOSPITAL_BASED_OUTPATIENT_CLINIC_OR_DEPARTMENT_OTHER): Payer: Self-pay | Admitting: Emergency Medicine

## 2019-10-19 ENCOUNTER — Emergency Department (HOSPITAL_BASED_OUTPATIENT_CLINIC_OR_DEPARTMENT_OTHER)
Admission: EM | Admit: 2019-10-19 | Discharge: 2019-10-19 | Disposition: A | Discharge status: Home / Self Care | Payer: Medicare HMO | Attending: Emergency Medicine | Admitting: Emergency Medicine

## 2019-10-19 ENCOUNTER — Other Ambulatory Visit: Payer: Self-pay

## 2019-10-19 ENCOUNTER — Emergency Department (HOSPITAL_BASED_OUTPATIENT_CLINIC_OR_DEPARTMENT_OTHER): Payer: Medicare HMO

## 2019-10-19 DIAGNOSIS — M79645 Pain in left finger(s): Secondary | ICD-10-CM | POA: Insufficient documentation

## 2019-10-19 DIAGNOSIS — Z23 Encounter for immunization: Secondary | ICD-10-CM | POA: Diagnosis not present

## 2019-10-19 DIAGNOSIS — Z79899 Other long term (current) drug therapy: Secondary | ICD-10-CM | POA: Insufficient documentation

## 2019-10-19 DIAGNOSIS — M79642 Pain in left hand: Secondary | ICD-10-CM | POA: Diagnosis present

## 2019-10-19 DIAGNOSIS — R2232 Localized swelling, mass and lump, left upper limb: Secondary | ICD-10-CM | POA: Diagnosis not present

## 2019-10-19 DIAGNOSIS — S60032A Contusion of left middle finger without damage to nail, initial encounter: Secondary | ICD-10-CM | POA: Diagnosis not present

## 2019-10-19 DIAGNOSIS — Z87891 Personal history of nicotine dependence: Secondary | ICD-10-CM | POA: Diagnosis not present

## 2019-10-19 MED ORDER — CEPHALEXIN 500 MG PO CAPS: 500.0000 mg | ORAL_CAPSULE | Freq: Three times a day (TID) | ORAL | 0 refills | Status: DC

## 2019-10-19 MED ORDER — TETANUS-DIPHTH-ACELL PERTUSSIS 5-2.5-18.5 LF-MCG/0.5 IM SUSP
0.5000 mL | Freq: Once | INTRAMUSCULAR | Status: AC
  Administered 2019-10-19: 0.5 mL via INTRAMUSCULAR
  Filled 2019-10-19: qty 0.5

## 2019-10-19 NOTE — Discharge Instructions (Signed)
Please pick up antibiotics and take as prescribed  You may take Tylenol as needed for pain/inflammation as well. I would recommend icing it at home to reduce the swelling as well as elevating your hand above the heart If no improvement by next week please follow up with Hand surgeon, Dr. Lenon Curt for further evaluation  Return to the ED IMMEDIATELY for any worsening symptoms including worsening pain, worsening swelling, increased redness to your finger or red streaking up your arm, color changes of the finger including purple/black color changes, drainage of pus, fevers > 100.4

## 2019-10-19 NOTE — ED Triage Notes (Signed)
Pt notice bruised swollen area on left middle finger yesterday.  Denies injury.  Painful to touch.

## 2019-10-19 NOTE — ED Provider Notes (Signed)
[name]MENT Provider Note   CSN: [id number] Arrival date & time: 10/19/19  0841     History Chief Complaint  Patient presents with  . Hand Pain    Marc[name] is a 79 y.o. male who presents to the ED today with complaint of gradual onset, constant, worsening, pain to L 3rd digit along middle phalanx that began yesterday.  Endorses that he was working on his garden yesterday and is unsure if he was bitten or stung by anything.  Not feel anything bite or sting him but noticed shortly after working in his garden that he began having pain to his finger.  He states that it has gradually increased in swelling.  He has not taken anything for the pain.  Denies any other injury to the finger.  No fevers, chills, redness, warmth, drainage of pus, tingling, any other associated symptoms. Last tetanus 2010.   The history is provided by the patient and medical records.       Past Medical History:  Diagnosis Date  . Acute meniscal tear of knee left  . Arthritis    generalized  . Calcaneal spur   . Colon cancer [facility]) 1983   surgical removed. did not receive radiation or chemotherapy.  . Coronary artery disease    dr Stanford Breed  . Cramps of lower extremity    in the mornings occ.  . Depression   . Diverticulosis of colon (without mention of hemorrhage)   . Echocardiogram findings abnormal, without diagnosis 04-23-2010   normal LV function, mod. left atrial enlargement, mild right artrial enlargement and grade 1 diastolic dysfunction  . Edema of lower extremity    ankles, elevates legs  . Elevated prostate specific antigen (PSA)   . Esophageal reflux   . External hemorrhoids   . Frequency of urination    followed by dr dalhstedt  . Gout   . Hiatal hernia   . History of colon cancer 1988  . Hyperlipidemia   . Lumbar spondylolysis   . Malaria 1964  . Normal nuclear stress test 04-23-2010   ef 51%,  septal hypokinesis, normal perfusion  . OSA  (obstructive sleep apnea)    does not use CPAP   . Paraesophageal hernia    large  . Personal history of colonic polyps 08/21/2009   TUBULAR ADENOMAS  . Pneumonia    history of; last episode 2015  . Postgastric surgery syndromes   . Presence of permanent cardiac pacemaker   . Prostate cancer ([place]) 2016  . Unspecified essential hypertension   . Unspecified vitamin D deficiency   . Ventral hernia   . Vitamin B12 deficiency     Patient Active Problem List   Diagnosis Date Noted  . Malignant neoplasm of prostate ([place]) 10/04/2018  . Syncope 08/01/2018  . AV block 08/01/2018  . Depression, recurrent ([place]) 12/23/2017  . Degeneration of lumbar intervertebral disc 10/14/2017  . Lumbar post-laminectomy syndrome 10/14/2017  . Primary osteoarthritis of knee 05/23/2015  . S/P knee replacement 05/23/2015  . Chronic cough 10/07/2014  . Chest pain, atypical 08/23/2014  . Elevated PSA 06/19/2014  . Preventative health care 06/19/2014  . Temporal headache 09/26/2013  . Adjustment disorder with depressed mood 09/26/2013  . Osteoarthritis of left knee 03/12/2013  . Lumbar strain 12/14/2011  . Skin lesion of face 07/23/2011  . Lump in chest 03/31/2011  . Iron deficiency anemia, unspecified  01/19/2011  . Hemorrhoids 01/19/2011  . Bradycardia 01/08/2011  . UNSPECIFIED VITAMIN D  DEFICIENCY 10/28/2009  . CALCANEAL SPUR 03/31/2009  . Hyperlipidemia 07/26/2008  . Obesity 10/17/2007  . INSOMNIA UNSPECIFIED 08/21/2007  . GOUT NOS 01/18/2007  . SLEEP APNEA, OBSTRUCTIVE 01/18/2007  . Essential hypertension 01/18/2007  . GERD 01/18/2007  . COLON CANCER, HX OF 01/18/2007  . COLONIC POLYPS, HX OF 01/18/2007  . HIATAL HERNIA 10/26/2006  . DIVERTICULOSIS, COLON 07/07/2005    Past Surgical History:  Procedure Laterality Date  . CARDIAC CATHETERIZATION    . CATARACT EXTRACTION W/ INTRAOCULAR LENS  IMPLANT, BILATERAL Bilateral   . COLONOSCOPY    . ESOPHAGOGASTRODUODENOSCOPY    . KNEE  ARTHROSCOPY Right 2007  . KNEE ARTHROSCOPY  07/13/2011   Procedure: ARTHROSCOPY KNEE;  Surgeon: Cynda Familia;  Location: [facility];  Service: Orthopedics;  Laterality: Left;  partial menisectomy with chondrylplasty  . KNEE SURGERY Left   . LAMINECTOMY AND MICRODISCECTOMY LUMBAR SPINE  MARCH  2008   L3 -  4  . LAPAROSCOPIC INCISIONAL / UMBILICAL / VENTRAL HERNIA REPAIR  2006  . PACEMAKER IMPLANT N/A 08/02/2018   Procedure: PACEMAKER IMPLANT;  Surgeon: Evans Lance, MD;  Location: [facility];  Service: Cardiovascular;  Laterality: N/A;  . PROSTATE BIOPSY    . RADIOACTIVE SEED IMPLANT N/A 02/12/2019   Procedure: RADIOACTIVE SEED IMPLANT/BRACHYTHERAPY IMPLANT;  Surgeon: Franchot Gallo, MD;  Location: [facility];  Service: Urology;  Laterality: N/A;  90 MINS  . SHOULDER ARTHROSCOPY Right 05-23-2007  . SIGMOID COLECTOMY FOR CANCER  1989  . SPACE OAR INSTILLATION N/A 02/12/2019   Procedure: SPACE OAR INSTILLATION;  Surgeon: Franchot Gallo, MD;  Location: [facility];  Service: Urology;  Laterality: N/A;  . TOTAL KNEE ARTHROPLASTY Left 05/23/2015   Procedure: LEFT TOTAL KNEE ARTHROPLASTY;  Surgeon: Sydnee Cabal, MD;  Location: [facility];  Service: Orthopedics;  Laterality: Left;  [name] Kitchen VERTICAL BANDED GASTROPLASTY  1986       Family History  Problem Relation Age of Onset  . Stroke Paternal Grandfather   . Heart disease Paternal Grandfather   . Esophagitis Father        died from perforated esophagus  . Breast cancer Mother   . Colon cancer Maternal Grandmother        questionable    Social History   Tobacco Use  . Smoking status: Former Smoker    Packs/day: 2.00    Years: 20.00    Pack years: 40.00    Types: Cigarettes    Quit date: 07/05/1974    Years since quitting: 45.3  . Smokeless tobacco: Never Used  Substance Use Topics  . Alcohol use: Yes    Alcohol/week: 0.0 standard drinks    Comment: socially; also 1-2 glasses of wine or beer every other day    . Drug use: No    Home Medications Prior to Admission medications   Medication Sig Start Date End Date Taking? Authorizing Provider  allopurinol (ZYLOPRIM) 300 MG tablet TAKE 1 TABLET EVERY DAY Patient taking differently: Take 300 mg by mouth daily.  03/27/19   Nafziger, Tommi Rumps, NP  amLODipine (NORVASC) 10 MG tablet TAKE 1 TABLET EVERY DAY Patient taking differently: Take 10 mg by mouth daily.  03/27/19   Nafziger, Tommi Rumps, NP  Calcium Carb-Cholecalciferol (CALCIUM 600 + D PO) Take 1 tablet by mouth daily.    [provider]  cephALEXin (KEFLEX) 500 MG capsule Take 1 capsule (500 mg total) by mouth 3 (three) times daily. 10/19/19   Alroy Bailiff, Morgan Rennert, PA-C  docusate sodium (COLACE) 100  MG capsule Take 100 mg by mouth daily.    [provider]  guaiFENesin (MUCINEX) 600 MG 12 hr tablet Take 1 tablet (600 mg total) by mouth 2 (two) times daily. Patient taking differently: Take 600 mg by mouth daily.  10/11/19   Noemi Chapel P, DO  magnesium oxide (MAG-OX) 400 MG tablet Take 400 mg by mouth daily.    [provider]  omeprazole (PRILOSEC) 40 MG capsule Take 1 capsule (40 mg total) by mouth daily. 12/07/18   Nafziger, Tommi Rumps, NP  Respiratory Therapy Supplies (FLUTTER) DEVI 1 each by Does not apply route in the morning and at bedtime. 10/11/19   Julian Hy, DO  simvastatin (ZOCOR) 20 MG tablet TAKE 1 TABLET AT BEDTIME Patient taking differently: Take 20 mg by mouth at bedtime.  10/11/19   Nafziger, Tommi Rumps, NP  sodium chloride HYPERTONIC 3 % nebulizer solution Take by nebulization in the morning and at bedtime. Patient not taking: Reported on 10/17/2019 10/11/19   Noemi Chapel P, DO  tamsulosin (FLOMAX) 0.4 MG CAPS capsule Take 0.4 mg by mouth daily.  03/04/16   [provider]  valsartan-hydrochlorothiazide (DIOVAN-HCT) 320-25 MG tablet TAKE 1 TABLET EVERY DAY Patient taking differently: Take 1 tablet by mouth daily.  03/27/19   Nafziger, Tommi Rumps, NP  vitamin B-12 (CYANOCOBALAMIN) 1000  MCG tablet Take 1,000 mcg by mouth daily.     [provider]    Allergies    Contrast media [iodinated diagnostic agents]  Review of Systems   Review of Systems  Constitutional: Negative for chills and fever.  Musculoskeletal: Positive for arthralgias and joint swelling.  Skin: Negative for color change and wound.    Physical Exam Updated Vital Signs BP (!) 144/74   Pulse (!) 59   Temp 98 F (36.7 C) (Oral)   Resp 16   Ht 5\' 8"  (1.727 m)   Wt 120.6 kg   SpO2 97%   BMI 40.41 kg/m   Physical Exam Vitals and nursing note reviewed.  Constitutional:      Appearance: He is not ill-appearing.  HENT:     Head: Normocephalic and atraumatic.  Eyes:     Conjunctiva/sclera: Conjunctivae normal.  Cardiovascular:     Rate and Rhythm: Normal rate and regular rhythm.  Pulmonary:     Effort: Pulmonary effort is normal.     Breath sounds: Normal breath sounds.  Musculoskeletal:     Comments: Mild swelling noted specifically to middle phalanx of left 3rd finger with TTP. No obvious wound. No drainage. No erythema or increased warmth to the touch. ROM limited to DIP joint due to swelling however ROM intact to MCP and PIP joint. Cap refill < 2 seconds. 2+ radial pulse.   Skin:    General: Skin is warm and dry.     Coloration: Skin is not jaundiced.  Neurological:     Mental Status: He is alert.     ED Results / Procedures / Treatments   Labs (all labs ordered are listed, but only abnormal results are displayed) Labs Reviewed - No data to display  EKG None  Radiology DG Finger Middle Left  Result Date: 10/19/2019 CLINICAL DATA:  Pain, bruising and swelling.  No injury. EXAM: LEFT MIDDLE FINGER 2+V COMPARISON:  05/06/2015 left hand radiographs. FINDINGS: Normal alignment. No fracture. Mild proximal and severe distal interphalangeal joint narrowing. At the distal IP joint there is subchondral sclerosis, marginal osteophytosis and tiny erosions. Mild soft tissue swelling.  No suspicious radiopaque foreign  bodies identified. IMPRESSION: No acute osseous abnormality.  Mild soft tissue swelling. Mild proximal and severe distal IP joint osteoarthrosis. Electronically Signed   By: Primitivo Gauze M.D.   On: 10/19/2019 09:42    Procedures Procedures (including critical care time)  Medications Ordered in ED Medications  Tdap (BOOSTRIX) injection 0.5 mL (has no administration in time range)    ED Course  I have reviewed the triage vital signs and the nursing notes.  Pertinent labs & imaging results that were available during my care of the patient were reviewed by me and considered in my medical decision making (see chart for details).    MDM Rules/Calculators/A&P                      79 year old male presents to the ED today complaining of atraumatic left middle finger pain.  He was working in his garden yesterday and shortly afterwards noticed pain.  Is unsure if he was bitten by an insect or stuck by something.  He has some tenderness over the middle phalanx with swelling.  No fusiform swelling to the finger.  No tenderness along the entire flexor sheath.  There is no concern for flexor tenosynovitis at this time.  There is no increased warmth, erythema, drainage  currently however patient does have tenderness to palpation along this aspect.  X-ray was obtained prior to being seen -no fracture or foreign body.  Patient does have noted osteoarthritis in his joints.  Patient unsure if he was specifically docked by anything do not feel that incision and drainage is appropriate at this time.  Will cover for an infection at this time.  Patient advised to take Tylenol as needed for pain as well as ice and elevation.  If no improvement next week patient to follow-up with hand surgery for further eval.  Patient is in agreement with plan and stable for discharge home.   This note was prepared using Dragon voice recognition software and may include unintentional dictation  errors due to the inherent limitations of voice recognition software.  Final Clinical Impression(s) / ED Diagnoses Final diagnoses:  Pain of finger of left hand    Rx / DC Orders ED Discharge Orders         Ordered    cephALEXin (KEFLEX) 500 MG capsule  3 times daily     10/19/19 1008           Discharge Instructions     Please pick up antibiotics and take as prescribed  You may take Tylenol as needed for pain/inflammation as well. I would recommend icing it at home to reduce the swelling as well as elevating your hand above the heart If no improvement by next week please follow up with Hand surgeon, Dr. Lenon Curt for further evaluation  Return to the ED IMMEDIATELY for any worsening symptoms including worsening pain, worsening swelling, increased redness to your finger or red streaking up your arm, color changes of the finger including purple/black color changes, drainage of pus, fevers > 100.4       Eustaquio Maize, PA-C 10/19/19 1015    Virgel Manifold, MD 10/20/19 1007

## 2019-10-23 DIAGNOSIS — M79645 Pain in left finger(s): Secondary | ICD-10-CM | POA: Diagnosis not present

## 2019-10-24 ENCOUNTER — Telehealth: Payer: Self-pay | Admitting: Adult Health

## 2019-10-24 NOTE — Telephone Encounter (Signed)
Can we call over to GI and see if we can get him seen for vomiting and concern for aspiration?

## 2019-10-24 NOTE — Telephone Encounter (Signed)
Spoke with GI. They stated they will reach out to the patient for scheduling.

## 2019-10-24 NOTE — Telephone Encounter (Signed)
Pt stated the last seven days straight he eats his dinner around 6pm-8pm and then he wakes up at 2:30 am with food in his lungs and choking. He spends the next three hours coughing and spitting it out. He would like to see a gastroenterology and to speak to [facility]. He does not want to speak to his CMA, he only wants to speak to [facility].   Transferred pt to Nurse Triage.  Pt can be reached at [phone]

## 2019-10-27 ENCOUNTER — Other Ambulatory Visit (HOSPITAL_COMMUNITY)
Admission: RE | Admit: 2019-10-27 | Discharge: 2019-10-27 | Disposition: A | Discharge status: Home / Self Care | Payer: Medicare HMO | Source: Ambulatory Visit | Attending: Critical Care Medicine | Admitting: Critical Care Medicine

## 2019-10-27 DIAGNOSIS — Z20822 Contact with and (suspected) exposure to covid-19: Secondary | ICD-10-CM | POA: Insufficient documentation

## 2019-10-27 DIAGNOSIS — Z01812 Encounter for preprocedural laboratory examination: Secondary | ICD-10-CM | POA: Diagnosis not present

## 2019-10-27 LAB — SARS CORONAVIRUS 2 (TAT 6-24 HRS): SARS Coronavirus 2: NEGATIVE

## 2019-10-30 ENCOUNTER — Ambulatory Visit (HOSPITAL_COMMUNITY): Payer: Medicare HMO

## 2019-10-30 ENCOUNTER — Encounter (HOSPITAL_COMMUNITY): Payer: Self-pay | Admitting: Critical Care Medicine

## 2019-10-30 ENCOUNTER — Ambulatory Visit (HOSPITAL_COMMUNITY)
Admission: RE | Admit: 2019-10-30 | Discharge: 2019-10-30 | Disposition: A | Discharge status: Home / Self Care | Payer: Medicare HMO | Attending: Critical Care Medicine | Admitting: Critical Care Medicine

## 2019-10-30 ENCOUNTER — Encounter (HOSPITAL_COMMUNITY)
Admission: RE | Disposition: A | Discharge status: Home / Self Care | Payer: Self-pay | Source: Home / Self Care | Attending: Critical Care Medicine

## 2019-10-30 DIAGNOSIS — Z800 Family history of malignant neoplasm of digestive organs: Secondary | ICD-10-CM | POA: Diagnosis not present

## 2019-10-30 DIAGNOSIS — R05 Cough: Secondary | ICD-10-CM

## 2019-10-30 DIAGNOSIS — R918 Other nonspecific abnormal finding of lung field: Secondary | ICD-10-CM | POA: Diagnosis not present

## 2019-10-30 DIAGNOSIS — Z79899 Other long term (current) drug therapy: Secondary | ICD-10-CM | POA: Diagnosis not present

## 2019-10-30 DIAGNOSIS — I10 Essential (primary) hypertension: Secondary | ICD-10-CM | POA: Diagnosis not present

## 2019-10-30 DIAGNOSIS — Z6838 Body mass index (BMI) 38.0-38.9, adult: Secondary | ICD-10-CM | POA: Diagnosis not present

## 2019-10-30 DIAGNOSIS — E559 Vitamin D deficiency, unspecified: Secondary | ICD-10-CM | POA: Diagnosis not present

## 2019-10-30 DIAGNOSIS — I2510 Atherosclerotic heart disease of native coronary artery without angina pectoris: Secondary | ICD-10-CM | POA: Diagnosis not present

## 2019-10-30 DIAGNOSIS — G4733 Obstructive sleep apnea (adult) (pediatric): Secondary | ICD-10-CM | POA: Insufficient documentation

## 2019-10-30 DIAGNOSIS — Z8546 Personal history of malignant neoplasm of prostate: Secondary | ICD-10-CM | POA: Diagnosis not present

## 2019-10-30 DIAGNOSIS — Z87891 Personal history of nicotine dependence: Secondary | ICD-10-CM | POA: Diagnosis not present

## 2019-10-30 DIAGNOSIS — M109 Gout, unspecified: Secondary | ICD-10-CM | POA: Diagnosis not present

## 2019-10-30 DIAGNOSIS — Z9884 Bariatric surgery status: Secondary | ICD-10-CM | POA: Insufficient documentation

## 2019-10-30 DIAGNOSIS — E785 Hyperlipidemia, unspecified: Secondary | ICD-10-CM | POA: Diagnosis not present

## 2019-10-30 DIAGNOSIS — Z950 Presence of cardiac pacemaker: Secondary | ICD-10-CM | POA: Insufficient documentation

## 2019-10-30 DIAGNOSIS — C61 Malignant neoplasm of prostate: Secondary | ICD-10-CM | POA: Diagnosis not present

## 2019-10-30 HISTORY — PX: BRONCHIAL WASHINGS: SHX5105

## 2019-10-30 HISTORY — PX: VIDEO BRONCHOSCOPY: SHX5072

## 2019-10-30 LAB — BODY FLUID CELL COUNT WITH DIFFERENTIAL
Eos, Fluid: 3 %
Lymphs, Fluid: 15 %
Monocyte-Macrophage-Serous Fluid: 80 % (ref 50–90)
Neutrophil Count, Fluid: 2 % (ref 0–25)
Total Nucleated Cell Count, Fluid: 185 cu mm (ref 0–1000)

## 2019-10-30 SURGERY — VIDEO BRONCHOSCOPY WITHOUT FLUORO
Anesthesia: General

## 2019-10-30 MED ORDER — LIDOCAINE HCL URETHRAL/MUCOSAL 2 % EX GEL: 1.0000 "application " | Freq: Once | CUTANEOUS | Status: DC

## 2019-10-30 MED ORDER — FENTANYL CITRATE (PF) 100 MCG/2ML IJ SOLN
INTRAMUSCULAR | Status: DC | PRN
  Administered 2019-10-30 (×2): 50 ug via INTRAVENOUS

## 2019-10-30 MED ORDER — LACTATED RINGERS IV SOLN: INTRAVENOUS | Status: DC

## 2019-10-30 MED ORDER — PROPOFOL 10 MG/ML IV BOLUS
INTRAVENOUS | Status: AC
  Filled 2019-10-30: qty 40

## 2019-10-30 MED ORDER — LIDOCAINE HCL (CARDIAC) PF 100 MG/5ML IV SOSY
PREFILLED_SYRINGE | INTRAVENOUS | Status: DC | PRN
  Administered 2019-10-30: 40 mg via INTRAVENOUS
  Administered 2019-10-30: 60 mg via INTRAVENOUS

## 2019-10-30 MED ORDER — ROCURONIUM BROMIDE 100 MG/10ML IV SOLN
INTRAVENOUS | Status: DC | PRN
  Administered 2019-10-30: 50 mg via INTRAVENOUS

## 2019-10-30 MED ORDER — SUCCINYLCHOLINE CHLORIDE 20 MG/ML IJ SOLN
INTRAMUSCULAR | Status: DC | PRN
  Administered 2019-10-30: 140 mg via INTRAVENOUS

## 2019-10-30 MED ORDER — PROPOFOL 10 MG/ML IV BOLUS
INTRAVENOUS | Status: DC | PRN
  Administered 2019-10-30: 200 mg via INTRAVENOUS

## 2019-10-30 MED ORDER — PHENYLEPHRINE HCL 0.25 % NA SOLN: 1.0000 | Freq: Four times a day (QID) | NASAL | Status: DC | PRN

## 2019-10-30 MED ORDER — BUTAMBEN-TETRACAINE-BENZOCAINE 2-2-14 % EX AERO: 1.0000 | INHALATION_SPRAY | Freq: Once | CUTANEOUS | Status: DC

## 2019-10-30 MED ORDER — FENTANYL CITRATE (PF) 100 MCG/2ML IJ SOLN
INTRAMUSCULAR | Status: AC
  Filled 2019-10-30: qty 2

## 2019-10-30 MED ORDER — SUGAMMADEX SODIUM 500 MG/5ML IV SOLN
INTRAVENOUS | Status: DC | PRN
  Administered 2019-10-30: 300 mg via INTRAVENOUS

## 2019-10-30 MED ORDER — ONDANSETRON HCL 4 MG/2ML IJ SOLN
INTRAMUSCULAR | Status: DC | PRN
  Administered 2019-10-30: 4 mg via INTRAVENOUS

## 2019-10-30 NOTE — Interval H&P Note (Signed)
History and Physical Interval Note:  10/30/2019 3:16 PM  [name]  has presented today for surgery, with the diagnosis of ATYPICAL INFECTION.  The various methods of treatment have been discussed with the patient and family. After consideration of risks, benefits and other options for treatment, the patient has consented to  Procedure(s): [place] (N/A) as a surgical intervention.  The patient's history has been reviewed, patient examined, no change in status, stable for surgery.  I have reviewed the patient's chart and labs.  Questions were answered to the patient's satisfaction.    NPO today. No AP or AC medications. Has a ride home with his son. No significant change in medical history since he was seen in clinic.   Julian Hy

## 2019-10-30 NOTE — Anesthesia Preprocedure Evaluation (Addendum)
Anesthesia Evaluation  Patient identified by MRN, date of birth, ID band Patient awake    Reviewed: Allergy & Precautions, NPO status , Patient's Chart, lab work & pertinent test results  Airway Mallampati: III  TM Distance: >3 FB Neck ROM: Full    Dental no notable dental hx.    Pulmonary sleep apnea and Continuous Positive Airway Pressure Ventilation , former smoker,    Pulmonary exam normal breath sounds clear to auscultation       Cardiovascular hypertension, + CAD  Normal cardiovascular exam+ pacemaker  Rhythm:Regular Rate:Normal  ECG: a-paced   Neuro/Psych  Headaches, PSYCHIATRIC DISORDERS Depression    GI/Hepatic Neg liver ROS, hiatal hernia, GERD  Medicated,  Endo/Other  Morbid obesity  Renal/GU negative Renal ROS     Musculoskeletal Gout   Abdominal (+) + obese,   Peds  Hematology HLD   Anesthesia Other Findings ATYPICAL INFECTION  Reproductive/Obstetrics                            Anesthesia Physical Anesthesia Plan  ASA: II  Anesthesia Plan: General   Post-op Pain Management:    Induction: Intravenous  PONV Risk Score and Plan: 2 and Ondansetron, Dexamethasone and Treatment may vary due to age or medical condition  Airway Management Planned: Oral ETT  Additional Equipment:   Intra-op Plan:   Post-operative Plan: Extubation in OR  Informed Consent: I have reviewed the patients History and Physical, chart, labs and discussed the procedure including the risks, benefits and alternatives for the proposed anesthesia with the patient or authorized representative who has indicated his/her understanding and acceptance.     Dental advisory given  Plan Discussed with: CRNA  Anesthesia Plan Comments:         Anesthesia Quick Evaluation

## 2019-10-30 NOTE — Transfer of Care (Signed)
Immediate Anesthesia Transfer of Care Note  Patient: [name]  Procedure(s) Performed: VIDEO BRONCHOSCOPY WITH BRONICAL ALVEROLAR LAVAGE WITHOUT FLUORO (N/A ) BRONCHIAL WASHINGS  Patient Location: Endoscopy Unit  Anesthesia Type:General  Level of Consciousness: awake, oriented, patient cooperative and responds to stimulation  Airway & Oxygen Therapy: Patient Spontanous Breathing and Patient connected to face mask oxygen  Post-op Assessment: Report given to RN and Post -op Vital signs reviewed and stable  Post vital signs: Reviewed and stable  Last Vitals:  Vitals Value Taken Time  BP 138/59 10/30/19 1600  Temp    Pulse 59 10/30/19 1600  Resp 14 10/30/19 1600  SpO2 96 % 10/30/19 1600  Vitals shown include unvalidated device data.  Last Pain:  Vitals:   10/30/19 1320  TempSrc: Oral  PainSc: 0-No pain         Complications: No apparent anesthesia complications

## 2019-10-30 NOTE — Procedures (Signed)
Video Bronchoscopy Procedure Note  Date of Operation: 10/30/2019  Pre-op Diagnosis: chronic cough, abnormal CT scan  Post-op Diagnosis: Same  Surgeon: Julian Hy  Assistants: none  Anesthesia: deep sedation per anesthesia  Meds Given: per anesthesia MAR  Operation: Flexible video fiberoptic bronchoscopy and biopsies.  Estimated Blood Loss: 0 cc  Complications: none noted  Indications and History: Marcus [name] is 79 y.o. with history of chronic cough, GERD.  Recommendation was to perform video fiberoptic bronchoscopy with BAL. The risks, benefits, complications, treatment options and expected outcomes were discussed with the patient.  The possibilities of pneumothorax, pneumonia, reaction to medication, pulmonary aspiration, perforation of a viscus, bleeding, failure to diagnose a condition and creating a complication requiring transfusion or operation were discussed with the patient who freely signed the consent.    Description of Procedure: The patient was seen in the Preoperative Area, was examined and was deemed appropriate to proceed.  The patient was taken to Endoscopy, identified as Marcus [name] with MRN [id number] and DOB Sep 07, 1940 and the procedure verified as Flexible Video Fiberoptic Bronchoscopy.  A Time Out was held and the above information confirmed.   Sedation was initiated as indicated above. The video fiberoptic bronchoscope was introduced via the ETT and a general inspection was performed which showed normal distal trachea, normal main carina. The R sided airways were inspected and showed dilated segmental bronchi and subsegmental bronchi to RUL, BI, RML, and RLL with some pitting. The L side was then inspected. The LLL, Lingular and LUL airways were also dilated with some pitting. No masses. Surprisingly minimal secretions, but the minimal secretions were thick & clear.  Bronchoalveolar lavage was performed of the anterior RUL. 180cc instilled with 70cc  returned. The patient tolerated the procedure well. The bronchoscope was removed. There were no obvious complications.   Samples: 1. BAL from RUL sent for cultures and cell count with diff.   Plans:  We will review the results with the patient when they become available.  Outpatient followup will be with Dr Carlis Abbott.  Julian Hy, DO 10/30/19 3:47 PM [facility]

## 2019-10-30 NOTE — Discharge Summary (Signed)
Physician Discharge Summary  Patient ID: Marcus [name] MRN: [id number] DOB/AGE: 1941/02/10 79 y.o.  Admit date: 10/30/2019 Discharge date: 10/30/2019  Admission Diagnoses: chronic cough, abnormal CT scan  Discharge Diagnoses:  chronic cough, abnormal CT scan  Discharged Condition: stable  Hospital Course: Admitted for bronchoscopy which was performed under general anesthesia due to his history of severe regurgitation episodes. BAL was performed for micro studies and cell count with diff. He recovered uneventfully and  was discharged with his son.  Consults: None  Significant Diagnostic Studies: microbiology from bronchoscopy  Treatments: IV hydration    Disposition:  home with son     Allergies as of 10/30/2019       Reactions   Contrast Media [iodinated Diagnostic Agents] Palpitations   TACHYCARDIA- patient states he has tolerated newer agents since this reaction >30 yrs ago        Medication List     TAKE these medications    allopurinol 300 MG tablet Commonly known as: ZYLOPRIM TAKE 1 TABLET EVERY DAY   amLODipine 10 MG tablet Commonly known as: NORVASC TAKE 1 TABLET EVERY DAY   CALCIUM 600 + D PO Take 1 tablet by mouth daily.   cephALEXin 500 MG capsule Commonly known as: KEFLEX Take 1 capsule (500 mg total) by mouth 3 (three) times daily.   docusate sodium 100 MG capsule Commonly known as: COLACE Take 100 mg by mouth daily.   Flutter Devi 1 each by Does not apply route in the morning and at bedtime.   magnesium oxide 400 MG tablet Commonly known as: MAG-OX Take 400 mg by mouth daily.   Mucinex 600 MG 12 hr tablet Generic drug: guaiFENesin Take 1 tablet (600 mg total) by mouth 2 (two) times daily. What changed: when to take this   omeprazole 40 MG capsule Commonly known as: PRILOSEC Take 1 capsule (40 mg total) by mouth daily.   simvastatin 20 MG tablet Commonly known as: ZOCOR TAKE 1 TABLET AT BEDTIME   sodium chloride HYPERTONIC 3 %  nebulizer solution Take by nebulization in the morning and at bedtime.   tamsulosin 0.4 MG Caps capsule Commonly known as: FLOMAX Take 0.4 mg by mouth daily.   valsartan-hydrochlorothiazide 320-25 MG tablet Commonly known as: DIOVAN-HCT TAKE 1 TABLET EVERY DAY   vitamin B-12 1000 MCG tablet Commonly known as: CYANOCOBALAMIN Take 1,000 mcg by mouth daily.          Signed: Julian Hy 10/30/2019, 3:57 PM

## 2019-10-30 NOTE — Anesthesia Postprocedure Evaluation (Signed)
Anesthesia Post Note  Patient: [name]  Procedure(s) Performed: VIDEO BRONCHOSCOPY WITH BRONICAL ALVEROLAR LAVAGE WITHOUT FLUORO (N/A ) BRONCHIAL WASHINGS     Patient location during evaluation: PACU Anesthesia Type: General Level of consciousness: sedated Pain management: pain level controlled Vital Signs Assessment: post-procedure vital signs reviewed and stable Respiratory status: spontaneous breathing and respiratory function stable Cardiovascular status: stable Postop Assessment: no apparent nausea or vomiting Anesthetic complications: no    Last Vitals:  Vitals:   10/30/19 1600 10/30/19 1610  BP: (!) 138/59 133/65  Pulse: (!) 59 60  Resp: 16 (!) 9  Temp:    SpO2: 96% 94%    Last Pain:  Vitals:   10/30/19 1610  TempSrc:   PainSc: 0-No pain                 Thadd Apuzzo DANIEL

## 2019-10-30 NOTE — Anesthesia Procedure Notes (Signed)
Procedure Name: Intubation Date/Time: 10/30/2019 3:58 PM Performed by: Mitzie Na, CRNA Pre-anesthesia Checklist: Patient identified, Emergency Drugs available, Suction available and Patient being monitored Patient Re-evaluated:Patient Re-evaluated prior to induction Oxygen Delivery Method: Circle system utilized Preoxygenation: Pre-oxygenation with 100% oxygen Induction Type: IV induction Laryngoscope Size: Mac and 4 Grade View: Grade II Tube type: Oral Tube size: 8.0 mm Number of attempts: 1 Airway Equipment and Method: Stylet Placement Confirmation: ETT inserted through vocal cords under direct vision,  positive ETCO2 and breath sounds checked- equal and bilateral Secured at: 23 cm Tube secured with: Tape Dental Injury: Injury to lip  Difficulty Due To: Difficult Airway- due to anterior larynx

## 2019-10-31 ENCOUNTER — Encounter: Payer: Self-pay | Admitting: *Deleted

## 2019-10-31 LAB — PATHOLOGIST SMEAR REVIEW

## 2019-11-01 ENCOUNTER — Other Ambulatory Visit: Payer: Self-pay | Admitting: Adult Health

## 2019-11-01 DIAGNOSIS — I10 Essential (primary) hypertension: Secondary | ICD-10-CM

## 2019-11-01 LAB — ACID FAST SMEAR (AFB, MYCOBACTERIA): Acid Fast Smear: NEGATIVE

## 2019-11-02 LAB — BODY FLUID CULTURE: Culture: NORMAL

## 2019-11-03 NOTE — Progress Notes (Signed)
Please let [name] know that his cultures so far have not grown anything. Thanks!  LPC

## 2019-11-04 DIAGNOSIS — G4733 Obstructive sleep apnea (adult) (pediatric): Secondary | ICD-10-CM | POA: Diagnosis not present

## 2019-11-07 ENCOUNTER — Ambulatory Visit: Payer: Medicare HMO | Admitting: Physician Assistant

## 2019-11-14 DIAGNOSIS — J479 Bronchiectasis, uncomplicated: Secondary | ICD-10-CM | POA: Diagnosis not present

## 2019-11-19 ENCOUNTER — Telehealth: Payer: Self-pay | Admitting: Adult Health

## 2019-11-19 ENCOUNTER — Ambulatory Visit (INDEPENDENT_AMBULATORY_CARE_PROVIDER_SITE_OTHER): Payer: Medicare HMO | Admitting: *Deleted

## 2019-11-19 ENCOUNTER — Ambulatory Visit: Payer: Medicare HMO | Admitting: Critical Care Medicine

## 2019-11-19 DIAGNOSIS — I4430 Unspecified atrioventricular block: Secondary | ICD-10-CM

## 2019-11-19 LAB — CUP PACEART REMOTE DEVICE CHECK
Date Time Interrogation Session: 20210517103001
Implantable Lead Implant Date: 20200129
Implantable Lead Implant Date: 20200129
Implantable Lead Location: 753859
Implantable Lead Location: 753860
Implantable Lead Model: 377
Implantable Lead Model: 377
Implantable Lead Serial Number: 80947537
Implantable Lead Serial Number: 80970966
Implantable Pulse Generator Implant Date: 20200129
Pulse Gen Model: 407145
Pulse Gen Serial Number: 69503797

## 2019-11-19 NOTE — Telephone Encounter (Signed)
The patient is wanting information about a life alert button because he's almost 79 and thinks that he might need one. He would like some information and recommendations.  Please advise

## 2019-11-19 NOTE — Progress Notes (Signed)
Virtual Visit via Telephone Note  I was unsuccessful in contacting the patient.  A voicemail was left.  No follow-up call to our office. Location: Patient: Home Provider: [facility] - [address], [place], [place], [place] [address]   I discussed the limitations, risks, security and privacy concerns of performing an evaluation and management service by telephone and the availability of in person appointments. I also discussed with the patient that there may be a patient responsible charge related to this service. The patient expressed understanding and agreed to proceed.  Patient consented to consult via telephone: Yes People present and their role in pt care: Pt     History of Present Illness:  79 year old male former smoker last seen in our office on 10/11/2019.  For chronic cough   Past medical history: Obstructive sleep apnea, hiatal hernia, hyperlipidemia, GERD, gout, insomnia, malignant neoplasm of prostate Smoking history: Former smoker Maintenance: Patient of Dr. Carlis Abbott 79 year old male current smoker Chief complaint:    11/20/19 - called left vm  11/20/19 - called again   10/11/19 Chronic cough likely due to chronic atypical infection. Minimal bronchiectasis and abnormalities to suggest chronic infection on CT scan. Alternatively, it is possible that the GGO and micronodules are related to aspiration pneumonitis. -HRCT chest reviewed today. -Recommend bronchoscopy with BAL to evaluate for atypical infection.  This will be scheduled in the next few weeks. -Recommend adding hypertonic saline with flutter valve twice daily to help with secretion clearance. -Add guaifenesin twice daily -I am concerned that his chronic regurgitation episodes are likely to continue to propagate this process.  GERD; h/o bariatric surgery -Continue PPI -Continue GERD lifestyle modifications  RTC in 6 weeks.  Observations/Objective:  Chest Imaging- films reviewed: CXR, 2  view 08/21/2019-elevated left hemidiaphragm, pacemaker in place.  Mild kyphosis with flattened hemidiaphragms most notable posteriorly.  CT chest 01/17/2015-lung parenchyma, no significant mediastinal adenopathy.  Senescent vascular calcification.  HRCT chest 09/27/19- Scattered groundglass opacities and tree-in-bud opacities.  Bilateral basilar linear scars.  Mild bronchiectasis bilateral lower lobes, right middle lobe, right upper lobe, lingula.  Minimal air-trapping.  No significant mediastinal or hilar adenopathy.  Likely small hiatal hernia.  Aortic valve, three-vessel coronary, and aortic calcifications.  Pulmonary Functions Testing Results: No flowsheet data found.   Echocardiogram 07/24/18: LVEF 55 to 47%, grade 1 diastolic dysfunction, mildly dilated ascending aorta.  Leg, RV, RA.  Trivial  and PR, otherwise normal valves.MR      Assessment & Plan:     ICD-10-CM   1. Chronic cough  R05 AMB REFERRAL FOR DME  2. Bronchiectasis without complication ([place])  W29.5   3. OSA (obstructive sleep apnea)  G47.33 Ambulatory Referral for DME    Chronic cough likely due to chronic atypical infection. Minimal bronchiectasis and abnormalities to suggest chronic infection on CT scan. Alternatively, it is possible that the GGO and micronodules are related to aspiration pneumonitis. -HRCT chest reviewed today. -Recommend bronchoscopy with BAL to evaluate for atypical infection.  This will be scheduled in the next few weeks. -Recommend adding hypertonic saline with flutter valve twice daily to help with secretion clearance. -Add guaifenesin twice daily -I am concerned that his chronic regurgitation episodes are likely to continue to propagate this process.  GERD; h/o bariatric surgery -Continue PPI -Continue GERD lifestyle modifications  RTC in 6 weeks.  Social History   Tobacco Use  Smoking Status Former Smoker  . Packs/day: 2.00  . Years: 20.00  . Pack years: 40.00  .  Types: Cigarettes  .  Quit date: 07/05/1974  . Years since quitting: 45.4  Smokeless Tobacco Never Used   Immunization History  Administered Date(s) Administered  . Influenza Whole 06/05/2007, 04/30/2008, 03/31/2009  . Influenza, High Dose Seasonal PF 06/19/2014, 07/30/2015, 03/03/2017  . Influenza,inj,quad, With Preservative 04/04/2017  . PFIZER SARS-COV-2 Vaccination 08/13/2019, 09/11/2019  . Pneumococcal Conjugate-13 06/19/2014, 07/30/2015  . Pneumococcal Polysaccharide-23 12/11/2003, 03/03/2017  . Td 03/31/2009  . Tdap 10/19/2019      Assessment and Plan:  No problem-specific Assessment & Plan notes found for this encounter.   Follow Up Instructions:  No follow-ups on file.   I discussed the assessment and treatment plan with the patient. The patient was provided an opportunity to ask questions and all were answered. The patient agreed with the plan and demonstrated an understanding of the instructions.   The patient was advised to call back or seek an in-person evaluation if the symptoms worsen or if the condition fails to improve as anticipated.  I provided 0 minutes of non-face-to-face time during this encounter.   Lauraine Rinne, NP

## 2019-11-20 ENCOUNTER — Ambulatory Visit (INDEPENDENT_AMBULATORY_CARE_PROVIDER_SITE_OTHER): Payer: Medicare HMO | Admitting: Pulmonary Disease

## 2019-11-20 ENCOUNTER — Encounter: Payer: Self-pay | Admitting: Pulmonary Disease

## 2019-11-20 ENCOUNTER — Other Ambulatory Visit: Payer: Self-pay

## 2019-11-20 DIAGNOSIS — R05 Cough: Secondary | ICD-10-CM

## 2019-11-20 NOTE — Patient Instructions (Signed)
We were unable to reach you for your telephone visit today.  Please contact our office and reschedule a follow-up.  Wyn Quaker, FNP

## 2019-11-20 NOTE — Progress Notes (Signed)
Remote pacemaker transmission.   

## 2019-11-21 NOTE — Telephone Encounter (Signed)
I do not think that is a bad idea at all.   [employer], [employer], and [employer] all have great products for reasonable prices.

## 2019-11-22 NOTE — Telephone Encounter (Signed)
Spoke to the pt and advised him of the companies listed below.  Nothing further needed.

## 2019-11-28 ENCOUNTER — Encounter: Payer: Self-pay | Admitting: Gastroenterology

## 2019-11-28 ENCOUNTER — Ambulatory Visit: Payer: Medicare HMO | Admitting: Gastroenterology

## 2019-11-28 VITALS — BP 142/78 | HR 60 | Ht 68.0 in | Wt 259.1 lb

## 2019-11-28 DIAGNOSIS — K219 Gastro-esophageal reflux disease without esophagitis: Secondary | ICD-10-CM | POA: Diagnosis not present

## 2019-11-28 MED ORDER — METOCLOPRAMIDE HCL 10 MG PO TABS
10.0000 mg | ORAL_TABLET | Freq: Every evening | ORAL | 0 refills | Status: DC | PRN
Start: 2019-11-28 — End: 2020-03-06

## 2019-11-28 NOTE — Patient Instructions (Addendum)
If you are age 80 or older, your body mass index should be between 23-30. Your Body mass index is 39.4 kg/m. If this is out of the aforementioned range listed, please consider follow up with your Primary Care Provider.  If you are age 23 or younger, your body mass index should be between 19-25. Your Body mass index is 39.4 kg/m. If this is out of the aformentioned range listed, please consider follow up with your Primary Care Provider.   We have sent the following medications to your pharmacy for you to pick up at your convenience: Reglan 10 mg at night as needed.   Follow up in one year or sooner if needed.

## 2019-11-28 NOTE — Progress Notes (Signed)
11/28/2019 Marcus [name] [id number] [birth year]   HISTORY OF PRESENT ILLNESS: This is a 79 year old male who is known to Dr. Carlean Purl for issues with GERD and delayed gastric emptying.  Last seen here August 31, 2018.  He is here today reporting that if he eats after 7:00 PM then he has a lot of issues with reflux and regurgitation.  He says that he has changed his eating habits and as long as he eats dinner before that and does not eat after 7:00 PM that he does not have any issues.  He says that he has a very well under control for the last 1 to 2 months.  This has been discussed and stressed with him in the past at previous visits with Dr. Carlean Purl including at his last visit.  He had also been given a prescription for Reglan to use before bedtime as needed to help with particular instances where he had to eat later in the evening.  He does not really even recall ever taking that medication.  He is on omeprazole 40 mg daily.  2018 w/ UGI showing post gastric-stapling with gastric stenosis - EGD w/ food retention, and Dr. Carlean Purl dilated the stenosis in gastric remnant and removed a hyperplastic polyp.     Past Medical History:  Diagnosis Date  . Acute meniscal tear of knee left  . Arthritis    generalized  . Calcaneal spur   . Colon cancer [facility]) 1983   surgical removed. did not receive radiation or chemotherapy.  . Coronary artery disease    dr Stanford Breed  . Cramps of lower extremity    in the mornings occ.  . Depression   . Diverticulosis of colon (without mention of hemorrhage)   . Echocardiogram findings abnormal, without diagnosis 04-23-2010   normal LV function, mod. left atrial enlargement, mild right artrial enlargement and grade 1 diastolic dysfunction  . Edema of lower extremity    ankles, elevates legs  . Elevated prostate specific antigen (PSA)   . Esophageal reflux   . External hemorrhoids   . Frequency of urination    followed by dr dalhstedt  . Gout   . Hiatal  hernia   . History of colon cancer 1988  . Hyperlipidemia   . Lumbar spondylolysis   . Malaria 1964  . Normal nuclear stress test 04-23-2010   ef 51%,  septal hypokinesis, normal perfusion  . OSA (obstructive sleep apnea)    does not use CPAP   . Paraesophageal hernia    large  . Personal history of colonic polyps 08/21/2009   TUBULAR ADENOMAS  . Pneumonia    history of; last episode 2015  . Postgastric surgery syndromes   . Presence of permanent cardiac pacemaker   . Prostate cancer ([place]) 2016  . Unspecified essential hypertension   . Unspecified vitamin D deficiency   . Ventral hernia   . Vitamin B12 deficiency    Past Surgical History:  Procedure Laterality Date  . BRONCHIAL WASHINGS  10/30/2019   Procedure: BRONCHIAL WASHINGS;  Surgeon: Julian Hy, DO;  Location: [facility];  Service: Endoscopy;;  . CARDIAC CATHETERIZATION    . CATARACT EXTRACTION W/ INTRAOCULAR LENS  IMPLANT, BILATERAL Bilateral   . COLONOSCOPY    . ESOPHAGOGASTRODUODENOSCOPY    . KNEE ARTHROSCOPY Right 2007  . KNEE ARTHROSCOPY  07/13/2011   Procedure: ARTHROSCOPY KNEE;  Surgeon: Cynda Familia;  Location: [facility];  Service: Orthopedics;  Laterality: Left;  partial  menisectomy with chondrylplasty  . KNEE SURGERY Left   . LAMINECTOMY AND MICRODISCECTOMY LUMBAR SPINE  MARCH  2008   L3 -  4  . LAPAROSCOPIC INCISIONAL / UMBILICAL / VENTRAL HERNIA REPAIR  2006  . PACEMAKER IMPLANT N/A 08/02/2018   Procedure: PACEMAKER IMPLANT;  Surgeon: Evans Lance, MD;  Location: [facility];  Service: Cardiovascular;  Laterality: N/A;  . PROSTATE BIOPSY    . RADIOACTIVE SEED IMPLANT N/A 02/12/2019   Procedure: RADIOACTIVE SEED IMPLANT/BRACHYTHERAPY IMPLANT;  Surgeon: Franchot Gallo, MD;  Location: [facility];  Service: Urology;  Laterality: N/A;  90 MINS  . SHOULDER ARTHROSCOPY Right 05-23-2007  . SIGMOID COLECTOMY FOR CANCER  1989  . SPACE OAR INSTILLATION N/A 02/12/2019    Procedure: SPACE OAR INSTILLATION;  Surgeon: Franchot Gallo, MD;  Location: [facility];  Service: Urology;  Laterality: N/A;  . TOTAL KNEE ARTHROPLASTY Left 05/23/2015   Procedure: LEFT TOTAL KNEE ARTHROPLASTY;  Surgeon: Sydnee Cabal, MD;  Location: [facility];  Service: Orthopedics;  Laterality: Left;  [name] Kitchen VERTICAL BANDED GASTROPLASTY  1986  . VIDEO BRONCHOSCOPY N/A 10/30/2019   Procedure: VIDEO BRONCHOSCOPY WITH BRONICAL ALVEROLAR LAVAGE WITHOUT FLUORO;  Surgeon: Julian Hy, DO;  Location: [facility];  Service: Endoscopy;  Laterality: N/A;    reports that he quit smoking about 45 years ago. His smoking use included cigarettes. He has a 40.00 pack-year smoking history. He has never used smokeless tobacco. He reports current alcohol use of about 10.0 standard drinks of alcohol per week. He reports that he does not use drugs. family history includes Breast cancer in his mother; Colon cancer in his maternal grandmother; Esophagitis in his father; Heart disease in his paternal grandfather; Stroke in his paternal grandfather. Allergies  Allergen Reactions  . Contrast Media [Iodinated Diagnostic Agents] Palpitations    TACHYCARDIA- patient states he has tolerated newer agents since this reaction >30 yrs ago      Outpatient Encounter Medications as of 11/28/2019  Medication Sig  . allopurinol (ZYLOPRIM) 300 MG tablet Take 1 tablet (300 mg total) by mouth daily.  [name] 10 MG tablet Take 1 tablet (10 mg total) by mouth daily.  . Calcium Carb-Cholecalciferol (CALCIUM 600 + D PO) Take 1 tablet by mouth daily.  [name] Kitchen docusate sodium (COLACE) 100 MG capsule Take 100 mg by mouth daily.  . magnesium oxide (MAG-OX) 400 MG tablet Take 400 mg by mouth daily.  [name] Kitchen omeprazole (PRILOSEC) 40 MG capsule Take 1 capsule (40 mg total) by mouth daily.  [name] Kitchen Respiratory Therapy Supplies (FLUTTER) DEVI 1 each by Does not apply route in the morning and at bedtime.  . simvastatin (ZOCOR) 20 MG tablet TAKE 1  TABLET AT BEDTIME  . tamsulosin (FLOMAX) 0.4 MG CAPS capsule Take 0.4 mg by mouth daily.   . valsartan-hydrochlorothiazide (DIOVAN-HCT) 320-25 MG tablet Take 1 tablet by mouth daily.  . vitamin B-12 (CYANOCOBALAMIN) 1000 MCG tablet Take 1,000 mcg by mouth daily.   . [DISCONTINUED] cephALEXin (KEFLEX) 500 MG capsule Take 1 capsule (500 mg total) by mouth 3 (three) times daily. (Patient not taking: Reported on 11/28/2019)  . [DISCONTINUED] guaiFENesin (MUCINEX) 600 MG 12 hr tablet Take 1 tablet (600 mg total) by mouth 2 (two) times daily. (Patient not taking: Reported on 11/28/2019)  . [DISCONTINUED] sodium chloride HYPERTONIC 3 % nebulizer solution Take by nebulization in the morning and at bedtime. (Patient not taking: Reported on 11/28/2019)   No facility-administered encounter medications on file as of 11/28/2019.  REVIEW OF SYSTEMS  : All other systems reviewed and negative except where noted in the History of Present Illness.   PHYSICAL EXAM: BP (!) 142/78 (BP Location: Left Arm, Patient Position: Sitting, Cuff Size: Large)   Pulse 60   Ht 5\' 8"  (1.727 m)   Wt 259 lb 2 oz (117.5 kg)   SpO2 98%   BMI 39.40 kg/m  General: Well developed male in no acute distress Head: Normocephalic and atraumatic Eyes:  Sclerae anicteric, conjunctiva pink. Ears: Normal auditory acuity Lungs: Clear throughout to auscultation; no increased WOB. Heart: Regular rate and rhythm; no M/R/G. Abdomen: Soft, non-distended.  BS present.  Non-tender.  Multiple scars noted on abdomen from previous surgeries.   Musculoskeletal: Symmetrical with no gross deformities  Skin: No lesions on visible extremities Extremities: No edema  Neurological: Alert oriented x 4, grossly non-focal Psychological:  Alert and cooperative. Normal mood and affect  ASSESSMENT AND PLAN: *GERD:  Mostly issues nocturnally if he eats late, after 7 PM.  Much improved by modifying this.  Had the exact same issues in the past.  Will  continue with the lifestyle modifications, omeprazole 40 mg daily, and can use reglan 10 mg qhs prn as well.  New prescription sent for #90 and no refills.  Follow-up in a year or sooner if needed.   CC:  Dorothyann Peng, NP

## 2019-11-29 LAB — FUNGUS CULTURE WITH STAIN

## 2019-11-29 LAB — FUNGUS CULTURE RESULT

## 2019-11-29 LAB — FUNGAL ORGANISM REFLEX

## 2019-12-04 DIAGNOSIS — J479 Bronchiectasis, uncomplicated: Secondary | ICD-10-CM | POA: Diagnosis not present

## 2019-12-05 DIAGNOSIS — G4733 Obstructive sleep apnea (adult) (pediatric): Secondary | ICD-10-CM | POA: Diagnosis not present

## 2019-12-13 ENCOUNTER — Ambulatory Visit: Payer: Medicare HMO | Admitting: Critical Care Medicine

## 2019-12-13 ENCOUNTER — Encounter: Payer: Self-pay | Admitting: Critical Care Medicine

## 2019-12-13 ENCOUNTER — Other Ambulatory Visit: Payer: Self-pay

## 2019-12-13 VITALS — BP 124/74 | HR 60 | Temp 97.6°F | Ht 68.0 in | Wt 260.4 lb

## 2019-12-13 DIAGNOSIS — K219 Gastro-esophageal reflux disease without esophagitis: Secondary | ICD-10-CM

## 2019-12-13 DIAGNOSIS — J479 Bronchiectasis, uncomplicated: Secondary | ICD-10-CM

## 2019-12-13 LAB — ACID FAST CULTURE WITH REFLEXED SENSITIVITIES (MYCOBACTERIA): Acid Fast Culture: NEGATIVE

## 2019-12-13 NOTE — Patient Instructions (Addendum)
Thank you for visiting Dr. Radonna Bracher at [facility]. We recommend the following:   Return if symptoms worsen or fail to improve.    Please do your part to reduce the spread of COVID-19. ' 

## 2019-12-13 NOTE — Progress Notes (Signed)
Synopsis: Referred in March 2021 for cough by Dorothyann Peng, NP.  Subjective:   PATIENT ID: Marcus [name] GENDER: male DOB: Dec 25, 1940, MRN: [id number]  Chief Complaint  Patient presents with   Follow-up    Patient states that he is feeling much better since last visit in April. Productive cough with white.grey sputum. Denies shortness of breath. No issues with his gerd    Marcus [name] is a 79 year old gentleman who presents for follow-up of chronic cough and sputum production.  Since his last visit he has been feeling improved.  He has had less reflux since making sure that he eats dinner earlier, around 5- 6 PM.  He tries to be done with dinner by 6:30 so he is several hours before he goes to bed.  He has had less episodes of reflux, less coughing, less sputum production.  He feels like he is able to clear mucus effectively with his cough.  No new complaints.    OV 10/11/19: Marcus [name] is a 79 y/o gentleman being seen in follow up of chronic cough with sputum production.  He is here to follow-up with his CT.  He was able to bring in a sputum sample, but unfortunately it was an inadequate sample.  No significant change in symptoms since his last visit.  No fevers or sweats, but he endorses occasional episodes of shaking chills that happened a few times a year for the last 60 years since he had malaria while he was in the [employer].  He uses CPAP but does not regularly like to clean his CPAP supplies.  He has never had his hoses and other disposable equipment replaced.  He sleeps on his right side.    OV 09/20/19: Marcus [name] is a 79 y/o gentleman with a history of obesity s/p bariatric surgery in 1980s, OSA on CPAP, and GERD who presents for evalaution of chronic cough.  His cough has been persistent but not worsening for the past 2 years.  It is associated with thick yellow sputum production.  No shortness of breath or wheezing.  The coughing is throughout the day every day.  It is worse  when he has significant episodes of regurgitation, which happens about once a month if he eats dinner too late.  He will wake up at night after having regurgitation and he is coughing up regurgitated material for about 2 hours, sometimes including food particles.  He has a remote history of pneumonia and has had bronchitis a few times.  No previous history of lung disease.  No family history of lung disease.  He has not been on inhalers.  He was recently seen by Dr. Lucia Gaskins from ENT on 08/28/2019 (note reviewed) who felt that his cough was unrelated to postnasal drip.  He had a sinus CT did not demonstrate any ongoing sinus issues.  He recommended starting Flonase, which the patient has not been taking.  He quit smoking 45 years ago, but previously had smoked 2 packs/day for 20 years.  He is not on an ACE inhibitor.  He has previously been managed by Dr. Carlean Purl from GI, last visit in 08/2018 (note reviewed).  He has a history of GERD and delayed gastric emptying.  He is previously required dilation of a stricture in the distal stomach.  He prescribed Reglan, which Marcus [name] is not taking.  He is on omeprazole daily.  He does not have episodes of heartburn, only the episodes of regurgitation.    Past Medical History:  Diagnosis Date   Acute meniscal tear of knee left   Arthritis    generalized   Calcaneal spur    Colon cancer ([place]) 1983   surgical removed. did not receive radiation or chemotherapy.   Coronary artery disease    dr Stanford Breed   Cramps of lower extremity    in the mornings occ.   Depression    Diverticulosis of colon (without mention of hemorrhage)    Echocardiogram findings abnormal, without diagnosis 04-23-2010   normal LV function, mod. left atrial enlargement, mild right artrial enlargement and grade 1 diastolic dysfunction   Edema of lower extremity    ankles, elevates legs   Elevated prostate specific antigen (PSA)    Esophageal reflux    External hemorrhoids      Frequency of urination    followed by dr dalhstedt   Gout    Hiatal hernia    History of colon cancer 1988   Hyperlipidemia    Lumbar spondylolysis    Malaria 1964   Normal nuclear stress test 04-23-2010   ef 51%,  septal hypokinesis, normal perfusion   OSA (obstructive sleep apnea)    does not use CPAP    Paraesophageal hernia    large   Personal history of colonic polyps 08/21/2009   TUBULAR ADENOMAS   Pneumonia    history of; last episode 2015   Postgastric surgery syndromes    Presence of permanent cardiac pacemaker    Prostate cancer ([place]) 2016   Unspecified essential hypertension    Unspecified vitamin D deficiency    Ventral hernia    Vitamin B12 deficiency      Family History  Problem Relation Age of Onset   Stroke Paternal Grandfather    Heart disease Paternal Grandfather    Esophagitis Father        died from perforated esophagus   Breast cancer Mother    Colon cancer Maternal Grandmother        questionable   Esophageal cancer Neg Hx    Prostate cancer Neg Hx    Stomach cancer Neg Hx      Past Surgical History:  Procedure Laterality Date   BRONCHIAL WASHINGS  10/30/2019   Procedure: BRONCHIAL WASHINGS;  Surgeon: Julian Hy, DO;  Location: [facility];  Service: Endoscopy;;   CARDIAC CATHETERIZATION     CATARACT EXTRACTION W/ INTRAOCULAR LENS  IMPLANT, BILATERAL Bilateral    COLONOSCOPY     ESOPHAGOGASTRODUODENOSCOPY     KNEE ARTHROSCOPY Right 2007   KNEE ARTHROSCOPY  07/13/2011   Procedure: ARTHROSCOPY KNEE;  Surgeon: Cynda Familia;  Location: [facility];  Service: Orthopedics;  Laterality: Left;  partial menisectomy with chondrylplasty   KNEE SURGERY Left    LAMINECTOMY AND MICRODISCECTOMY LUMBAR SPINE  MARCH  2008   L3 -  4   LAPAROSCOPIC INCISIONAL / UMBILICAL / VENTRAL HERNIA REPAIR  2006   PACEMAKER IMPLANT N/A 08/02/2018   Procedure: PACEMAKER IMPLANT;  Surgeon: Evans Lance,  MD;  Location: [facility];  Service: Cardiovascular;  Laterality: N/A;   PROSTATE BIOPSY     RADIOACTIVE SEED IMPLANT N/A 02/12/2019   Procedure: RADIOACTIVE SEED IMPLANT/BRACHYTHERAPY IMPLANT;  Surgeon: Franchot Gallo, MD;  Location: [facility];  Service: Urology;  Laterality: N/A;  90 MINS   SHOULDER ARTHROSCOPY Right 05-23-2007   SIGMOID COLECTOMY FOR CANCER  1989   SPACE OAR INSTILLATION N/A 02/12/2019   Procedure: SPACE OAR INSTILLATION;  Surgeon: Franchot Gallo, MD;  Location: [facility];  Service: Urology;  Laterality: N/A;   TOTAL KNEE ARTHROPLASTY Left 05/23/2015   Procedure: LEFT TOTAL KNEE ARTHROPLASTY;  Surgeon: Sydnee Cabal, MD;  Location: [facility];  Service: Orthopedics;  Laterality: Left;   VERTICAL BANDED GASTROPLASTY  1986   VIDEO BRONCHOSCOPY N/A 10/30/2019   Procedure: VIDEO BRONCHOSCOPY WITH BRONICAL ALVEROLAR LAVAGE WITHOUT FLUORO;  Surgeon: Julian Hy, DO;  Location: [facility];  Service: Endoscopy;  Laterality: N/A;    Social History   Socioeconomic History   Marital status: Married    Spouse name: Not on file   Number of children: 4   Years of education: Not on file   Highest education level: Not on file  Occupational History   Occupation: retired    Fish farm manager: [employer]  Tobacco Use   Smoking status: Former Smoker    Packs/day: 2.00    Years: 20.00    Pack years: 40.00    Types: Cigarettes    Quit date: 07/05/1974    Years since quitting: 45.4   Smokeless tobacco: Never Used  Vaping Use   Vaping Use: Never used  Substance and Sexual Activity   Alcohol use: Yes    Alcohol/week: 10.0 standard drinks    Types: 10 Glasses of wine per week    Comment: socially; also 1-2 glasses of wine or beer every other day    Drug use: No   Sexual activity: Not Currently  Other Topics Concern   Not on file  Social History Narrative   Recently widowed   Former Smoker    Alcohol use-yes 1-2 drinks per day      Occupation:  Retired Midwife      Originally from [place], [place] - in [place] > 10 yrs as of 2017      Social Determinants of Health   Financial Resource Strain:    Difficulty of Paying Living Expenses:   Food Insecurity:    Worried About Charity fundraiser in the Last Year:    Arboriculturist in the Last Year:   Transportation Needs:    Film/video editor (Medical):    Lack of Transportation (Non-Medical):   Physical Activity:    Days of Exercise per Week:    Minutes of Exercise per Session:   Stress:    Feeling of Stress :   Social Connections:    Frequency of Communication with Friends and Family:    Frequency of Social Gatherings with Friends and Family:    Attends Religious Services:    Active Member of Clubs or Organizations:    Attends Archivist Meetings:    Marital Status:   Intimate Partner Violence:    Fear of Current or Ex-Partner:    Emotionally Abused:    Physically Abused:    Sexually Abused:      Allergies  Allergen Reactions   Contrast Media [Iodinated Diagnostic Agents] Palpitations    TACHYCARDIA- patient states he has tolerated newer agents since this reaction >30 yrs ago     Immunization History  Administered Date(s) Administered   Influenza Whole 06/05/2007, 04/30/2008, 03/31/2009   Influenza, High Dose Seasonal PF 06/19/2014, 07/30/2015, 03/03/2017   Influenza,inj,quad, With Preservative 04/04/2017   PFIZER SARS-COV-2 Vaccination 08/13/2019, 09/11/2019   Pneumococcal Conjugate-13 06/19/2014, 07/30/2015   Pneumococcal Polysaccharide-23 12/11/2003, 03/03/2017   Td 03/31/2009   Tdap 10/19/2019    Outpatient Medications Prior to Visit  Medication Sig Dispense Refill   allopurinol (ZYLOPRIM) 300 MG tablet Take 1 tablet (  300 mg total) by mouth daily. 90 tablet 1   amLODipine (NORVASC) 10 MG tablet Take 1 tablet (10 mg total) by mouth daily. 90 tablet 1   Calcium Carb-Cholecalciferol (CALCIUM 600 + D PO) Take 1 tablet  by mouth daily.     docusate sodium (COLACE) 100 MG capsule Take 100 mg by mouth daily.     magnesium oxide (MAG-OX) 400 MG tablet Take 400 mg by mouth daily.     metoCLOPramide (REGLAN) 10 MG tablet Take 1 tablet (10 mg total) by mouth at bedtime as needed for nausea. 90 tablet 0   omeprazole (PRILOSEC) 40 MG capsule Take 1 capsule (40 mg total) by mouth daily. 90 capsule 1   Respiratory Therapy Supplies (FLUTTER) DEVI 1 each by Does not apply route in the morning and at bedtime. 1 each 0   simvastatin (ZOCOR) 20 MG tablet TAKE 1 TABLET AT BEDTIME 90 tablet 1   tamsulosin (FLOMAX) 0.4 MG CAPS capsule Take 0.4 mg by mouth daily.      valsartan-hydrochlorothiazide (DIOVAN-HCT) 320-25 MG tablet Take 1 tablet by mouth daily. 90 tablet 1   vitamin B-12 (CYANOCOBALAMIN) 1000 MCG tablet Take 1,000 mcg by mouth daily.      No facility-administered medications prior to visit.    Review of Systems  Constitutional: Negative for chills and fever.  HENT: Negative for congestion.        No postnasal drip  Respiratory: Positive for cough and sputum production. Negative for hemoptysis, shortness of breath and wheezing.   Cardiovascular: Negative.   Gastrointestinal: Positive for heartburn.  Skin: Negative for rash.     Objective:   Vitals:   12/13/19 1113  BP: 124/74  Pulse: 60  Temp: 97.6 F (36.4 C)  TempSrc: Oral  SpO2: 99%  Weight: 260 lb 6.4 oz (118.1 kg)  Height: _0  (1.727 m)   99% on   RA BMI Readings from Last 3 Encounters:  12/13/19 39.59 kg/m  11/28/19 39.40 kg/m  10/30/19 38.01 kg/m   Wt Readings from Last 3 Encounters:  12/13/19 260 lb 6.4 oz (118.1 kg)  11/28/19 259 lb 2 oz (117.5 kg)  10/30/19 250 lb (113.4 kg)    Physical Exam Vitals reviewed.  Constitutional:      Appearance: Normal appearance. He is obese. He is not ill-appearing.  HENT:     Head: Normocephalic and atraumatic.  Eyes:     General: No scleral icterus. Cardiovascular:     Rate  and Rhythm: Normal rate and regular rhythm.     Heart sounds: No murmur heard.   Pulmonary:     Comments: Breathing comfortably on room air, no conversational dyspnea.  No observed coughing.  Clear to auscultation bilaterally. Abdominal:     General: There is no distension.     Palpations: Abdomen is soft.     Tenderness: There is no abdominal tenderness.  Musculoskeletal:        General: No swelling or deformity.     Cervical back: Neck supple.  Lymphadenopathy:     Cervical: No cervical adenopathy.  Skin:    General: Skin is warm and dry.     Findings: No rash.  Neurological:     General: No focal deficit present.     Mental Status: He is alert.     Coordination: Coordination normal.  Psychiatric:        Mood and Affect: Mood normal.        Behavior: Behavior normal.  CBC    Component Value Date/Time   WBC 6.2 02/07/2019 1446   RBC 4.02 (L) 02/07/2019 1446   HGB 11.8 (L) 02/07/2019 1446   HCT 37.3 (L) 02/07/2019 1446   PLT 230 02/07/2019 1446   MCV 92.8 02/07/2019 1446   MCH 29.4 02/07/2019 1446   MCHC 31.6 02/07/2019 1446   RDW 14.9 02/07/2019 1446   LYMPHSABS 1.5 07/12/2018 1036   MONOABS 0.6 07/12/2018 1036   EOSABS 0.1 07/12/2018 1036   BASOSABS 0.0 07/12/2018 1036    CHEMISTRY No results for input(s): NA, K, CL, CO2, GLUCOSE, BUN, CREATININE, CALCIUM, MG, PHOS in the last 168 hours. CrCl cannot be calculated (Patient's most recent lab result is older than the maximum 21 days allowed.).  10/30/2019 BAL cell count with differential: 185 cells, 2% PMNs, 80% monocytes, 15% lymphocytes  Micro: 10/30/19 BAL culture-normal flora 10/30/19 BAL fungus-KOH smear negative 10/30/2019 BAL AFB- negative  Chest Imaging- films reviewed: CXR, 2 view 08/21/2019-elevated left hemidiaphragm, pacemaker in place.  Mild kyphosis with flattened hemidiaphragms most notable posteriorly.  CT chest 01/17/2015-lung parenchyma, no significant mediastinal adenopathy.  Senescent  vascular calcification.  HRCT chest 09/27/19- Scattered groundglass opacities and tree-in-bud opacities.  Bilateral basilar linear scars.  Mild bronchiectasis bilateral lower lobes, right middle lobe, right upper lobe, lingula.  Minimal air-trapping.  No significant mediastinal or hilar adenopathy.  Likely small hiatal hernia.  Aortic valve, three-vessel coronary, and aortic calcifications.  Pulmonary Functions Testing Results: No flowsheet data found.   Echocardiogram 07/24/18: LVEF 55 to 41%, grade 1 diastolic dysfunction, mildly dilated ascending aorta.  Leg, RV, RA.  Trivial  and PR, otherwise normal valves.MR      Assessment & Plan:     ICD-10-CM   1. Gastroesophageal reflux disease, unspecified whether esophagitis present  K21.9   2. Bronchiectasis without complication (HCC)  L24.4     Chronic cough due to uncontrolled GERD. Minimal bronchiectasis and aspiration related pneumonitis on CT scan.  Bronchoscopic cultures have remained negative so far.  White blood cell differential from BAL not suggestive of an infectious process. -Agree with dietary modifications to better control GERD.  He should continue these indefinitely. -Long-term weight loss would likely benefit him  GERD; h/o bariatric surgery -Continue PPI -Continue GERD lifestyle modifications that he has adopted  RTC PRN.   Current Outpatient Medications:    allopurinol (ZYLOPRIM) 300 MG tablet, Take 1 tablet (300 mg total) by mouth daily., Disp: 90 tablet, Rfl: 1   amLODipine (NORVASC) 10 MG tablet, Take 1 tablet (10 mg total) by mouth daily., Disp: 90 tablet, Rfl: 1   Calcium Carb-Cholecalciferol (CALCIUM 600 + D PO), Take 1 tablet by mouth daily., Disp: , Rfl:    docusate sodium (COLACE) 100 MG capsule, Take 100 mg by mouth daily., Disp: , Rfl:    magnesium oxide (MAG-OX) 400 MG tablet, Take 400 mg by mouth daily., Disp: , Rfl:    metoCLOPramide (REGLAN) 10 MG tablet, Take 1 tablet (10 mg total) by mouth at  bedtime as needed for nausea., Disp: 90 tablet, Rfl: 0   omeprazole (PRILOSEC) 40 MG capsule, Take 1 capsule (40 mg total) by mouth daily., Disp: 90 capsule, Rfl: 1   Respiratory Therapy Supplies (FLUTTER) DEVI, 1 each by Does not apply route in the morning and at bedtime., Disp: 1 each, Rfl: 0   simvastatin (ZOCOR) 20 MG tablet, TAKE 1 TABLET AT BEDTIME, Disp: 90 tablet, Rfl: 1   tamsulosin (FLOMAX) 0.4 MG CAPS capsule, Take 0.4 mg  by mouth daily. , Disp: , Rfl:    valsartan-hydrochlorothiazide (DIOVAN-HCT) 320-25 MG tablet, Take 1 tablet by mouth daily., Disp: 90 tablet, Rfl: 1   vitamin B-12 (CYANOCOBALAMIN) 1000 MCG tablet, Take 1,000 mcg by mouth daily. , Disp: , Rfl:      Julian Hy, DO [facility] 12/13/2019 11:30 AM

## 2019-12-15 DIAGNOSIS — J479 Bronchiectasis, uncomplicated: Secondary | ICD-10-CM | POA: Diagnosis not present

## 2019-12-26 ENCOUNTER — Other Ambulatory Visit: Payer: Self-pay | Admitting: Family Medicine

## 2019-12-27 MED ORDER — OMEPRAZOLE 40 MG PO CPDR: 40.0000 mg | DELAYED_RELEASE_CAPSULE | Freq: Every day | ORAL | 1 refills | Status: DC

## 2020-01-03 DIAGNOSIS — J479 Bronchiectasis, uncomplicated: Secondary | ICD-10-CM | POA: Diagnosis not present

## 2020-01-04 DIAGNOSIS — G4733 Obstructive sleep apnea (adult) (pediatric): Secondary | ICD-10-CM | POA: Diagnosis not present

## 2020-01-04 DIAGNOSIS — Z03818 Encounter for observation for suspected exposure to other biological agents ruled out: Secondary | ICD-10-CM | POA: Diagnosis not present

## 2020-01-14 ENCOUNTER — Telehealth: Payer: Self-pay | Admitting: Emergency Medicine

## 2020-01-14 DIAGNOSIS — J479 Bronchiectasis, uncomplicated: Secondary | ICD-10-CM | POA: Diagnosis not present

## 2020-01-14 NOTE — Telephone Encounter (Signed)
Patient notified that alert received because no communication by monitor with device for at least 21 days. Patient checked monitor and it was unplugged by accident. Monitor now plugged in.

## 2020-02-01 ENCOUNTER — Telehealth: Payer: Self-pay | Admitting: Adult Health

## 2020-02-01 NOTE — Telephone Encounter (Signed)
Pt is calling stating that he needs to speak with Tommi Rumps on a personal matter and would not elaborate on it.

## 2020-02-03 DIAGNOSIS — J479 Bronchiectasis, uncomplicated: Secondary | ICD-10-CM | POA: Diagnosis not present

## 2020-02-04 DIAGNOSIS — G4733 Obstructive sleep apnea (adult) (pediatric): Secondary | ICD-10-CM | POA: Diagnosis not present

## 2020-02-14 DIAGNOSIS — J479 Bronchiectasis, uncomplicated: Secondary | ICD-10-CM | POA: Diagnosis not present

## 2020-02-18 ENCOUNTER — Ambulatory Visit (INDEPENDENT_AMBULATORY_CARE_PROVIDER_SITE_OTHER): Payer: Medicare Other | Admitting: *Deleted

## 2020-02-18 DIAGNOSIS — I4430 Unspecified atrioventricular block: Secondary | ICD-10-CM | POA: Diagnosis not present

## 2020-02-19 ENCOUNTER — Ambulatory Visit: Payer: Medicare Other | Admitting: Adult Health

## 2020-02-19 LAB — CUP PACEART REMOTE DEVICE CHECK
Date Time Interrogation Session: 20210816100526
Implantable Lead Implant Date: 20200129
Implantable Lead Implant Date: 20200129
Implantable Lead Location: 753859
Implantable Lead Location: 753860
Implantable Lead Model: 377
Implantable Lead Model: 377
Implantable Lead Serial Number: 80947537
Implantable Lead Serial Number: 80970966
Implantable Pulse Generator Implant Date: 20200129
Pulse Gen Model: 407145
Pulse Gen Serial Number: 69503797

## 2020-02-19 NOTE — Progress Notes (Signed)
Remote pacemaker transmission.   

## 2020-02-28 ENCOUNTER — Encounter: Payer: Self-pay | Admitting: Adult Health

## 2020-02-28 ENCOUNTER — Ambulatory Visit (INDEPENDENT_AMBULATORY_CARE_PROVIDER_SITE_OTHER): Payer: Medicare Other | Admitting: Adult Health

## 2020-02-28 ENCOUNTER — Other Ambulatory Visit: Payer: Self-pay

## 2020-02-28 VITALS — BP 122/60 | HR 71 | Temp 98.1°F | Ht 68.0 in | Wt 254.0 lb

## 2020-02-28 DIAGNOSIS — K591 Functional diarrhea: Secondary | ICD-10-CM

## 2020-02-28 NOTE — Progress Notes (Signed)
Subjective:    Patient ID: Marcus [name], male    DOB: Nov 24, 1940, 79 y.o.   MRN: [id number]  HPI 79 year old male who  has a past medical history of Acute meniscal tear of knee (left), Arthritis, Calcaneal spur, Colon cancer ([place]) (1983), Coronary artery disease, Cramps of lower extremity, Depression, Diverticulosis of colon (without mention of hemorrhage), Echocardiogram findings abnormal, without diagnosis (04-23-2010), Edema of lower extremity, Elevated prostate specific antigen (PSA), Esophageal reflux, External hemorrhoids, Frequency of urination, Gout, Hiatal hernia, History of colon cancer (1988), Hyperlipidemia, Lumbar spondylolysis, Malaria (1964), Normal nuclear stress test (04-23-2010), OSA (obstructive sleep apnea), Paraesophageal hernia, Personal history of colonic polyps (08/21/2009), Pneumonia, Postgastric surgery syndromes, Presence of permanent cardiac pacemaker, Prostate cancer ([place]) (2016), Unspecified essential hypertension, Unspecified vitamin D deficiency, Ventral hernia, and Vitamin B12 deficiency.  He presents to the office today for intermittent diarrhea over the last month.  He reports that the diarrhea does not happen every day but happens most days of the week.  He believes it is from something that he is eating or drinking.  He has tried eliminating items such as sweet tea, sodas, and junk foods from his diet this does not seem to help.  He denies any blood in his stool.  He does report that he continues to battle acid reflux.  We went through his medication list and he is not taking Prilosec currently he did not know that he had a refill at the pharmacy.  He is also taking magnesium on a daily basis and he is not sure why he is taking this medication.  He has not been on any antibiotics recently   Review of Systems  See HPI   Past Medical History:  Diagnosis Date  . Acute meniscal tear of knee left  . Arthritis    generalized  . Calcaneal spur   . Colon cancer  [facility]) 1983   surgical removed. did not receive radiation or chemotherapy.  . Coronary artery disease    dr Stanford Breed  . Cramps of lower extremity    in the mornings occ.  . Depression   . Diverticulosis of colon (without mention of hemorrhage)   . Echocardiogram findings abnormal, without diagnosis 04-23-2010   normal LV function, mod. left atrial enlargement, mild right artrial enlargement and grade 1 diastolic dysfunction  . Edema of lower extremity    ankles, elevates legs  . Elevated prostate specific antigen (PSA)   . Esophageal reflux   . External hemorrhoids   . Frequency of urination    followed by dr dalhstedt  . Gout   . Hiatal hernia   . History of colon cancer 1988  . Hyperlipidemia   . Lumbar spondylolysis   . Malaria 1964  . Normal nuclear stress test 04-23-2010   ef 51%,  septal hypokinesis, normal perfusion  . OSA (obstructive sleep apnea)    does not use CPAP   . Paraesophageal hernia    large  . Personal history of colonic polyps 08/21/2009   TUBULAR ADENOMAS  . Pneumonia    history of; last episode 2015  . Postgastric surgery syndromes   . Presence of permanent cardiac pacemaker   . Prostate cancer ([place]) 2016  . Unspecified essential hypertension   . Unspecified vitamin D deficiency   . Ventral hernia   . Vitamin B12 deficiency     Social History   Socioeconomic History  . Marital status: Married    Spouse name: Not on file  .  Number of children: 4  . Years of education: Not on file  . Highest education level: Not on file  Occupational History  . Occupation: retired    Fish farm manager: [employer]  Tobacco Use  . Smoking status: Former Smoker    Packs/day: 2.00    Years: 20.00    Pack years: 40.00    Types: Cigarettes    Quit date: 07/05/1974    Years since quitting: 45.6  . Smokeless tobacco: Never Used  Vaping Use  . Vaping Use: Never used  Substance and Sexual Activity  . Alcohol use: Yes    Alcohol/week: 10.0 standard drinks     Types: 10 Glasses of wine per week    Comment: socially; also 1-2 glasses of wine or beer every other day   . Drug use: No  . Sexual activity: Not Currently  Other Topics Concern  . Not on file  Social History Narrative   Recently widowed   Former Smoker    Alcohol use-yes 1-2 drinks per day      Occupation: Retired Midwife      Originally from [place], [place] - in [place] > 10 yrs as of 2017      Social Determinants of Health   Financial Resource Strain:   . Difficulty of Paying Living Expenses: Not on file  Food Insecurity:   . Worried About Charity fundraiser in the Last Year: Not on file  . Ran Out of Food in the Last Year: Not on file  Transportation Needs:   . Lack of Transportation (Medical): Not on file  . Lack of Transportation (Non-Medical): Not on file  Physical Activity:   . Days of Exercise per Week: Not on file  . Minutes of Exercise per Session: Not on file  Stress:   . Feeling of Stress : Not on file  Social Connections:   . Frequency of Communication with Friends and Family: Not on file  . Frequency of Social Gatherings with Friends and Family: Not on file  . Attends Religious Services: Not on file  . Active Member of Clubs or Organizations: Not on file  . Attends Archivist Meetings: Not on file  . Marital Status: Not on file  Intimate Partner Violence:   . Fear of Current or Ex-Partner: Not on file  . Emotionally Abused: Not on file  . Physically Abused: Not on file  . Sexually Abused: Not on file    Past Surgical History:  Procedure Laterality Date  . BRONCHIAL WASHINGS  10/30/2019   Procedure: BRONCHIAL WASHINGS;  Surgeon: Julian Hy, DO;  [facility]: [facility];  Service: Endoscopy;;  . CARDIAC CATHETERIZATION    . CATARACT EXTRACTION W/ INTRAOCULAR LENS  IMPLANT, BILATERAL Bilateral   . COLONOSCOPY    . ESOPHAGOGASTRODUODENOSCOPY    . KNEE ARTHROSCOPY Right 2007  . KNEE ARTHROSCOPY  07/13/2011   Procedure: ARTHROSCOPY KNEE;  Surgeon:  Cynda Familia;  Location: [facility];  Service: Orthopedics;  Laterality: Left;  partial menisectomy with chondrylplasty  . KNEE SURGERY Left   . LAMINECTOMY AND MICRODISCECTOMY LUMBAR SPINE  MARCH  2008   L3 -  4  . LAPAROSCOPIC INCISIONAL / UMBILICAL / VENTRAL HERNIA REPAIR  2006  . PACEMAKER IMPLANT N/A 08/02/2018   Procedure: PACEMAKER IMPLANT;  Surgeon: Evans Lance, MD;  Location: [facility];  Service: Cardiovascular;  Laterality: N/A;  . PROSTATE BIOPSY    . RADIOACTIVE SEED IMPLANT N/A 02/12/2019  Procedure: RADIOACTIVE SEED IMPLANT/BRACHYTHERAPY IMPLANT;  Surgeon: Franchot Gallo, MD;  [facility];  Service: Urology;  Laterality: N/A;  90 MINS  . SHOULDER ARTHROSCOPY Right 05-23-2007  . SIGMOID COLECTOMY FOR CANCER  1989  . SPACE OAR INSTILLATION N/A 02/12/2019   Procedure: SPACE OAR INSTILLATION;  Surgeon: Franchot Gallo, MD;  [facility];  Service: Urology;  Laterality: N/A;  . TOTAL KNEE ARTHROPLASTY Left 05/23/2015   Procedure: LEFT TOTAL KNEE ARTHROPLASTY;  Surgeon: Sydnee Cabal, MD;  [facility];  Service: Orthopedics;  Laterality: Left;  [name] Kitchen VERTICAL BANDED GASTROPLASTY  1986  . VIDEO BRONCHOSCOPY N/A 10/30/2019   Procedure: VIDEO BRONCHOSCOPY WITH BRONICAL ALVEROLAR LAVAGE WITHOUT FLUORO;  Surgeon: Julian Hy, DO;  [facility];  Service: Endoscopy;  Laterality: N/A;    Family History  Problem Relation Age of Onset  . Stroke Paternal Grandfather   . Heart disease Paternal Grandfather   . Esophagitis Father        died from perforated esophagus  . Breast cancer Mother   . Colon cancer Maternal Grandmother        questionable  . Esophageal cancer Neg Hx   . Prostate cancer Neg Hx   . Stomach cancer Neg Hx     Allergies  Allergen Reactions  . Contrast Media [Iodinated Diagnostic Agents] Palpitations    TACHYCARDIA- patient states he has tolerated newer agents since this reaction >30 yrs ago     Current Outpatient Medications on File Prior to Visit  Medication Sig Dispense Refill  . allopurinol (ZYLOPRIM) 300 MG tablet Take 1 tablet (300 mg total) by mouth daily. 90 tablet 1  . amLODipine (NORVASC) 10 MG tablet Take 1 tablet (10 mg total) by mouth daily. 90 tablet 1  . Calcium Carb-Cholecalciferol (CALCIUM 600 + D PO) Take 1 tablet by mouth daily.    [name] Kitchen docusate sodium (COLACE) 100 MG capsule Take 100 mg by mouth daily.    . magnesium oxide (MAG-OX) 400 MG tablet Take 400 mg by mouth daily.    . metoCLOPramide (REGLAN) 10 MG tablet Take 1 tablet (10 mg total) by mouth at bedtime as needed for nausea. 90 tablet 0  . omeprazole (PRILOSEC) 40 MG capsule Take 1 capsule (40 mg total) by mouth daily. 90 capsule 1  . Respiratory Therapy Supplies (FLUTTER) DEVI 1 each by Does not apply route in the morning and at bedtime. 1 each 0  . simvastatin (ZOCOR) 20 MG tablet TAKE 1 TABLET AT BEDTIME 90 tablet 1  . tamsulosin (FLOMAX) 0.4 MG CAPS capsule Take 0.4 mg by mouth daily.     . valsartan-hydrochlorothiazide (DIOVAN-HCT) 320-25 MG tablet Take 1 tablet by mouth daily. 90 tablet 1  . vitamin B-12 (CYANOCOBALAMIN) 1000 MCG tablet Take 1,000 mcg by mouth daily.      No current facility-administered medications on file prior to visit.    BP 122/60   Pulse 71   Temp 98.1 F (36.7 C) (Oral)   Ht 5\' 8"  (1.727 m)   Wt 254 lb (115.2 kg)   SpO2 94%   BMI 38.62 kg/m       Objective:   Physical Exam Vitals and nursing note reviewed.  Constitutional:      Appearance: Normal appearance.  Cardiovascular:     Rate and Rhythm: Regular rhythm.     Pulses: Normal pulses.     Heart sounds: Normal heart sounds.  Pulmonary:     Effort: Pulmonary effort is normal.  Breath sounds: Normal breath sounds.  Abdominal:     General: Abdomen is flat. Bowel sounds are normal.     Palpations: Abdomen is soft.  Skin:    General: Skin is warm and dry.     Capillary Refill: Capillary refill takes  less than 2 seconds.  Neurological:     General: No focal deficit present.     Mental Status: He is alert and oriented to person, place, and time.  Psychiatric:        Mood and Affect: Mood normal.        Behavior: Behavior normal.        Thought Content: Thought content normal.        Judgment: Judgment normal.       Assessment & Plan:  1. Functional diarrhea -Vies to start back on his Prilosec and stop magnesium which may be is because of his diarrhea.  Can also do the brat diet for the next 72 hours.  If no improvement then he was advised to follow-up with gastroenterology.  He is okay with this plan.  Dorothyann Peng, NP

## 2020-03-04 ENCOUNTER — Telehealth: Payer: Self-pay | Admitting: Adult Health

## 2020-03-04 NOTE — Progress Notes (Signed)
  Chronic Care Management   Note  03/04/2020 Name: Marcus [name] MRN: [id number] DOB: 1940-09-24  Marcus [name] is a 79 y.o. year old male who is a primary care patient of Marcus Peng, Marcus [name]. I reached out to Marcus [name] by phone today in response to a referral sent by Marcus [name]'s PCP, Marcus Peng, Marcus [name].   Marcus [name] was given information about Chronic Care Management services today including:  1. CCM service includes personalized support from designated clinical staff supervised by his physician, including individualized plan of care and coordination with other care providers 2. 24/7 contact phone numbers for assistance for urgent and routine care needs. 3. Service will only be billed when office clinical staff spend 20 minutes or more in a month to coordinate care. 4. Only one practitioner may furnish and bill the service in a calendar month. 5. The patient may stop CCM services at any time (effective at the end of the month) by phone call to the office staff.   Patient agreed to services and verbal consent obtained.   Follow up plan:   Marcus [name] [employer] Scheduler

## 2020-03-05 ENCOUNTER — Ambulatory Visit (INDEPENDENT_AMBULATORY_CARE_PROVIDER_SITE_OTHER): Payer: Medicare Other | Admitting: Adult Health

## 2020-03-05 ENCOUNTER — Encounter: Payer: Self-pay | Admitting: Adult Health

## 2020-03-05 ENCOUNTER — Other Ambulatory Visit: Payer: Self-pay

## 2020-03-05 ENCOUNTER — Other Ambulatory Visit: Payer: Self-pay | Admitting: Gastroenterology

## 2020-03-05 VITALS — BP 120/82 | HR 61 | Temp 98.0°F | Wt 256.8 lb

## 2020-03-05 DIAGNOSIS — R42 Dizziness and giddiness: Secondary | ICD-10-CM

## 2020-03-05 DIAGNOSIS — R413 Other amnesia: Secondary | ICD-10-CM | POA: Diagnosis not present

## 2020-03-05 DIAGNOSIS — R4189 Other symptoms and signs involving cognitive functions and awareness: Secondary | ICD-10-CM

## 2020-03-05 DIAGNOSIS — R2681 Unsteadiness on feet: Secondary | ICD-10-CM | POA: Diagnosis not present

## 2020-03-05 DIAGNOSIS — E668 Other obesity: Secondary | ICD-10-CM

## 2020-03-05 DIAGNOSIS — J479 Bronchiectasis, uncomplicated: Secondary | ICD-10-CM | POA: Diagnosis not present

## 2020-03-05 NOTE — Progress Notes (Signed)
Subjective:    Patient ID: Marcus [name], male    DOB: 01-Sep-1940, 79 y.o.   MRN: [id number]  HPI 79 year old male who  has a past medical history of Acute meniscal tear of knee (left), Arthritis, Calcaneal spur, Colon cancer ([place]) (1983), Coronary artery disease, Cramps of lower extremity, Depression, Diverticulosis of colon (without mention of hemorrhage), Echocardiogram findings abnormal, without diagnosis (04-23-2010), Edema of lower extremity, Elevated prostate specific antigen (PSA), Esophageal reflux, External hemorrhoids, Frequency of urination, Gout, Hiatal hernia, History of colon cancer (1988), Hyperlipidemia, Lumbar spondylolysis, Malaria (1964), Normal nuclear stress test (04-23-2010), OSA (obstructive sleep apnea), Paraesophageal hernia, Personal history of colonic polyps (08/21/2009), Pneumonia, Postgastric surgery syndromes, Presence of permanent cardiac pacemaker, Prostate cancer ([place]) (2016), Unspecified essential hypertension, Unspecified vitamin D deficiency, Ventral hernia, and Vitamin B12 deficiency.  He presents to the office today for multiple concerns  1) Memory loss -feels that over the last year his memory has been "going downhill".  He is having a hard time remembering names, the best example that he can give me today is that "I cannot really remember the name of the restaurant that I currently go to".  He denies getting lost while driving or misplacing items besides his cell phone.  He does not really have trouble remembering names of family members or close acquaintances.  He does not feel as though his memory is affecting his day-to-day activities having trouble performing routine tasks.  2) frequent falls-Per patient he has been falling more often.  Prior to coming to the office he had a fall at home where he bent over became slightly dizzy and fell onto his face.  There are no apparent injuries today.  The recent fall he had was when he was up on a ladder picking  tomatoes from his garden and when he turned his head he "became disoriented" and fell backwards off the ladder.  Again there was no injury with this fall.  He is concerned about feeling "disoriented" and falling  3) Obesity -he has struggled with obesity for much of his life.  He reports many years ago he went through "hospital related weight loss clinic, where they had me take a multivitamin and drink a protein shake, with this I was able to lose about 40 pounds".  He continues to stay active with gardening and enjoys being outside but feels as though his weight is causing issues with mobility.  He is interested in a weight loss clinic, if [facility] has any.  He does try and eat a healthy diet and stay as active as possible.   Review of Systems See HPI   Past Medical History:  Diagnosis Date   Acute meniscal tear of knee left   Arthritis    generalized   Calcaneal spur    Colon cancer ([place]) 1983   surgical removed. did not receive radiation or chemotherapy.   Coronary artery disease    dr Stanford Breed   Cramps of lower extremity    in the mornings occ.   Depression    Diverticulosis of colon (without mention of hemorrhage)    Echocardiogram findings abnormal, without diagnosis 04-23-2010   normal LV function, mod. left atrial enlargement, mild right artrial enlargement and grade 1 diastolic dysfunction   Edema of lower extremity    ankles, elevates legs   Elevated prostate specific antigen (PSA)    Esophageal reflux    External hemorrhoids    Frequency of urination    followed by  dr dalhstedt   Gout    Hiatal hernia    History of colon cancer 1988   Hyperlipidemia    Lumbar spondylolysis    Malaria 1964   Normal nuclear stress test 04-23-2010   ef 51%,  septal hypokinesis, normal perfusion   OSA (obstructive sleep apnea)    does not use CPAP    Paraesophageal hernia    large   Personal history of colonic polyps 08/21/2009   TUBULAR ADENOMAS   Pneumonia     history of; last episode 2015   Postgastric surgery syndromes    Presence of permanent cardiac pacemaker    Prostate cancer ([place]) 2016   Unspecified essential hypertension    Unspecified vitamin D deficiency    Ventral hernia    Vitamin B12 deficiency     Social History   Socioeconomic History   Marital status: Married    Spouse name: Not on file   Number of children: 4   Years of education: Not on file   Highest education level: Not on file  Occupational History   Occupation: retired    Fish farm manager: [employer]  Tobacco Use   Smoking status: Former Smoker    Packs/day: 2.00    Years: 20.00    Pack years: 40.00    Types: Cigarettes    Quit date: 07/05/1974    Years since quitting: 45.6   Smokeless tobacco: Never Used  Vaping Use   Vaping Use: Never used  Substance and Sexual Activity   Alcohol use: Yes    Alcohol/week: 10.0 standard drinks    Types: 10 Glasses of wine per week    Comment: socially; also 1-2 glasses of wine or beer every other day    Drug use: No   Sexual activity: Not Currently  Other Topics Concern   Not on file  Social History Narrative   Recently widowed   Former Smoker    Alcohol use-yes 1-2 drinks per day      Occupation: Retired Midwife      Originally from [place], [place] - in [place] > 10 yrs as of 2017      Social Determinants of Health   Financial Resource Strain:    Difficulty of Paying Living Expenses: Not on file  Food Insecurity:    Worried About Charity fundraiser in the Last Year: Not on file   [employer] of Food in the Last Year: Not on file  Transportation Needs:    Film/video editor (Medical): Not on file   Lack of Transportation (Non-Medical): Not on file  Physical Activity:    Days of Exercise per Week: Not on file   Minutes of Exercise per Session: Not on file  Stress:    Feeling of Stress : Not on file  Social Connections:    Frequency of Communication with Friends and Family: Not on  file   Frequency of Social Gatherings with Friends and Family: Not on file   Attends Religious Services: Not on file   Active Member of Clubs or Organizations: Not on file   Attends Archivist Meetings: Not on file   Marital Status: Not on file  Intimate Partner Violence:    Fear of Current or Ex-Partner: Not on file   Emotionally Abused: Not on file   Physically Abused: Not on file   Sexually Abused: Not on file    Past Surgical History:  Procedure Laterality Date   BRONCHIAL WASHINGS  10/30/2019   Procedure:  BRONCHIAL WASHINGS;  Surgeon: Julian Hy, DO;  [facility];  Service: Endoscopy;;   CARDIAC CATHETERIZATION     CATARACT EXTRACTION W/ INTRAOCULAR LENS  IMPLANT, BILATERAL Bilateral    COLONOSCOPY     ESOPHAGOGASTRODUODENOSCOPY     KNEE ARTHROSCOPY Right 2007   KNEE ARTHROSCOPY  07/13/2011   Procedure: ARTHROSCOPY KNEE;  Surgeon: Cynda Familia;  Location: [facility];  Service: Orthopedics;  Laterality: Left;  partial menisectomy with chondrylplasty   KNEE SURGERY Left    LAMINECTOMY AND MICRODISCECTOMY LUMBAR SPINE  MARCH  2008   L3 -  4   LAPAROSCOPIC INCISIONAL / UMBILICAL / VENTRAL HERNIA REPAIR  2006   PACEMAKER IMPLANT N/A 08/02/2018   Procedure: PACEMAKER IMPLANT;  Surgeon: Evans Lance, MD;  Location: [facility];  Service: Cardiovascular;  Laterality: N/A;   PROSTATE BIOPSY     RADIOACTIVE SEED IMPLANT N/A 02/12/2019   Procedure: RADIOACTIVE SEED IMPLANT/BRACHYTHERAPY IMPLANT;  Surgeon: Franchot Gallo, MD;  [facility];  Service: Urology;  Laterality: N/A;  90 MINS   SHOULDER ARTHROSCOPY Right 05-23-2007   SIGMOID COLECTOMY FOR CANCER  1989   SPACE OAR INSTILLATION N/A 02/12/2019   Procedure: SPACE OAR INSTILLATION;  Surgeon: Franchot Gallo, MD;  [facility];  Service: Urology;  Laterality: N/A;   TOTAL KNEE ARTHROPLASTY Left 05/23/2015   Procedure: LEFT TOTAL KNEE  ARTHROPLASTY;  Surgeon: Sydnee Cabal, MD;  [facility];  Service: Orthopedics;  Laterality: Left;   VERTICAL BANDED GASTROPLASTY  1986   VIDEO BRONCHOSCOPY N/A 10/30/2019   Procedure: VIDEO BRONCHOSCOPY WITH BRONICAL ALVEROLAR LAVAGE WITHOUT FLUORO;  Surgeon: Julian Hy, DO;  [facility];  Service: Endoscopy;  Laterality: N/A;    Family History  Problem Relation Age of Onset   Stroke Paternal Grandfather    Heart disease Paternal Grandfather    Esophagitis Father        died from perforated esophagus   Breast cancer Mother    Colon cancer Maternal Grandmother        questionable   Esophageal cancer Neg Hx    Prostate cancer Neg Hx    Stomach cancer Neg Hx     Allergies  Allergen Reactions   Contrast Media [Iodinated Diagnostic Agents] Palpitations    TACHYCARDIA- patient states he has tolerated newer agents since this reaction >30 yrs ago    Current Outpatient Medications on File Prior to Visit  Medication Sig Dispense Refill   allopurinol (ZYLOPRIM) 300 MG tablet Take 1 tablet (300 mg total) by mouth daily. 90 tablet 1   amLODipine (NORVASC) 10 MG tablet Take 1 tablet (10 mg total) by mouth daily. 90 tablet 1   Calcium Carb-Cholecalciferol (CALCIUM 600 + D PO) Take 1 tablet by mouth daily.     docusate sodium (COLACE) 100 MG capsule Take 100 mg by mouth daily.      metoCLOPramide (REGLAN) 10 MG tablet Take 1 tablet (10 mg total) by mouth at bedtime as needed for nausea. 90 tablet 0   omeprazole (PRILOSEC) 40 MG capsule Take 1 capsule (40 mg total) by mouth daily. 90 capsule 1   Respiratory Therapy Supplies (FLUTTER) DEVI 1 each by Does not apply route in the morning and at bedtime. 1 each 0   simvastatin (ZOCOR) 20 MG tablet TAKE 1 TABLET AT BEDTIME 90 tablet 1   tamsulosin (FLOMAX) 0.4 MG CAPS capsule Take 0.4 mg by mouth daily.      valsartan-hydrochlorothiazide (DIOVAN-HCT) 320-25 MG  tablet Take 1 tablet by mouth daily. 90 tablet 1     vitamin B-12 (CYANOCOBALAMIN) 1000 MCG tablet Take 1,000 mcg by mouth daily.      No current facility-administered medications on file prior to visit.    BP 120/82 (BP Location: Left Arm, Patient Position: Sitting, Cuff Size: Large)    Pulse 61    Temp 98 F (36.7 C) (Oral)    Wt 256 lb 12.8 oz (116.5 kg)    SpO2 96%    BMI 39.05 kg/m       Objective:   Physical Exam Vitals and nursing note reviewed.  Constitutional:      Appearance: Normal appearance. He is obese.  Cardiovascular:     Rate and Rhythm: Normal rate and regular rhythm.     Pulses: Normal pulses.     Heart sounds: Normal heart sounds.  Pulmonary:     Effort: Pulmonary effort is normal.     Breath sounds: Normal breath sounds.  Musculoskeletal:        General: Normal range of motion.  Skin:    General: Skin is warm and dry.     Capillary Refill: Capillary refill takes less than 2 seconds.  Neurological:     General: No focal deficit present.     Mental Status: He is alert and oriented to person, place, and time.     Coordination: Coordination abnormal.     Gait: Gait abnormal.     Comments: I had him bend at the waist and with doing so he became dizzy and had to sit down.  This quickly resolved.  He also became dizzy with turning his head side to side  Psychiatric:        Mood and Affect: Mood normal.        Behavior: Behavior normal.        Thought Content: Thought content normal.        Judgment: Judgment normal.       Assessment & Plan:  1. Memory deficit MMSE= 29/30  - TSH; Future - Vitamin D, 25-hydroxy; Future - CBC with Differential/Platelet; Future - Vitamin B12; Future - Vitamin B12 - CBC with Differential/Platelet - Vitamin D, 25-hydroxy - TSH - Can consider MRI of head  2. Vertigo -Believe that his dizziness is related more to vertigo and not necessarily orthostatic hypotension.  Will refer to physical therapy for vestibular rehab as well as gait instability. - Ambulatory referral to  Physical Therapy - Can consider MRI of head   3. Gait instability  - Ambulatory referral to Physical Therapy  4. Other obesity  - Amb Ref to Medical Weight Management   Dorothyann Peng, NP

## 2020-03-06 ENCOUNTER — Telehealth: Payer: Self-pay | Admitting: Adult Health

## 2020-03-06 DIAGNOSIS — R413 Other amnesia: Secondary | ICD-10-CM

## 2020-03-06 DIAGNOSIS — R42 Dizziness and giddiness: Secondary | ICD-10-CM

## 2020-03-06 DIAGNOSIS — G4733 Obstructive sleep apnea (adult) (pediatric): Secondary | ICD-10-CM | POA: Diagnosis not present

## 2020-03-06 DIAGNOSIS — R2681 Unsteadiness on feet: Secondary | ICD-10-CM

## 2020-03-06 LAB — CBC WITH DIFFERENTIAL/PLATELET
Absolute Monocytes: 551 {cells}/uL (ref 200–950)
Basophils Absolute: 41 {cells}/uL (ref 0–200)
Basophils Relative: 0.7 %
Eosinophils Absolute: 191 {cells}/uL (ref 15–500)
Eosinophils Relative: 3.3 %
HCT: 40.6 % (ref 38.5–50.0)
Hemoglobin: 13.3 g/dL (ref 13.2–17.1)
Lymphs Abs: 1566 {cells}/uL (ref 850–3900)
MCH: 29.4 pg (ref 27.0–33.0)
MCHC: 32.8 g/dL (ref 32.0–36.0)
MCV: 89.6 fL (ref 80.0–100.0)
MPV: 11.4 fL (ref 7.5–12.5)
Monocytes Relative: 9.5 %
Neutro Abs: 3451 {cells}/uL (ref 1500–7800)
Neutrophils Relative %: 59.5 %
Platelets: 198 Thousand/uL (ref 140–400)
RBC: 4.53 Million/uL (ref 4.20–5.80)
RDW: 14.5 % (ref 11.0–15.0)
Total Lymphocyte: 27 %
WBC: 5.8 Thousand/uL (ref 3.8–10.8)

## 2020-03-06 LAB — TSH: TSH: 0.75 mIU/L (ref 0.40–4.50)

## 2020-03-06 LAB — VITAMIN D 25 HYDROXY (VIT D DEFICIENCY, FRACTURES): Vit D, 25-Hydroxy: 23 ng/mL — ABNORMAL LOW (ref 30–100)

## 2020-03-06 LAB — VITAMIN B12: Vitamin B-12: 909 pg/mL (ref 200–1100)

## 2020-03-06 NOTE — Telephone Encounter (Signed)
Spoke to patient and informed him of his labs.   Will order an MRI d/t cognitive issues and dizziness with frequent falls

## 2020-03-07 ENCOUNTER — Telehealth: Payer: Self-pay | Admitting: Adult Health

## 2020-03-07 NOTE — Addendum Note (Signed)
Addended by: Rodrigo Ran on: 03/07/2020 01:23 PM   Modules accepted: Orders

## 2020-03-07 NOTE — Telephone Encounter (Signed)
Marcus [name] called to say that the MRI machine they use is not compatible with the pt because he has a Psychologist, forensic. They will be unable to perform the image. He will need to be referred to another facility..  Please adise

## 2020-03-07 NOTE — Telephone Encounter (Signed)
MRI location changed to [facility].

## 2020-03-11 ENCOUNTER — Telehealth: Payer: Self-pay | Admitting: Adult Health

## 2020-03-11 ENCOUNTER — Other Ambulatory Visit: Payer: Self-pay | Admitting: Adult Health

## 2020-03-11 MED ORDER — SIMVASTATIN 20 MG PO TABS: 20.0000 mg | ORAL_TABLET | Freq: Every day | ORAL | 3 refills | Status: DC

## 2020-03-11 NOTE — Telephone Encounter (Signed)
Pt stated he is out of this medication and changed pharmacies recently.   Medication Refill: Simvastatin   Pharmacy: [facility], [place] Phone:  [phone]  Fax:  [phone]

## 2020-03-16 DIAGNOSIS — J479 Bronchiectasis, uncomplicated: Secondary | ICD-10-CM | POA: Diagnosis not present

## 2020-03-20 DIAGNOSIS — M545 Low back pain: Secondary | ICD-10-CM | POA: Diagnosis not present

## 2020-03-20 DIAGNOSIS — M5416 Radiculopathy, lumbar region: Secondary | ICD-10-CM | POA: Diagnosis not present

## 2020-03-25 DIAGNOSIS — H527 Unspecified disorder of refraction: Secondary | ICD-10-CM | POA: Diagnosis not present

## 2020-03-25 DIAGNOSIS — Z961 Presence of intraocular lens: Secondary | ICD-10-CM | POA: Diagnosis not present

## 2020-03-25 DIAGNOSIS — H353131 Nonexudative age-related macular degeneration, bilateral, early dry stage: Secondary | ICD-10-CM | POA: Diagnosis not present

## 2020-03-26 ENCOUNTER — Ambulatory Visit: Payer: Medicare Other | Admitting: Internal Medicine

## 2020-03-26 ENCOUNTER — Encounter: Payer: Self-pay | Admitting: Internal Medicine

## 2020-03-26 ENCOUNTER — Other Ambulatory Visit: Payer: Self-pay

## 2020-03-26 VITALS — BP 126/84 | HR 60 | Ht 68.0 in | Wt 256.6 lb

## 2020-03-26 DIAGNOSIS — R001 Bradycardia, unspecified: Secondary | ICD-10-CM

## 2020-03-26 DIAGNOSIS — Z950 Presence of cardiac pacemaker: Secondary | ICD-10-CM | POA: Diagnosis not present

## 2020-03-26 DIAGNOSIS — I4430 Unspecified atrioventricular block: Secondary | ICD-10-CM

## 2020-03-26 DIAGNOSIS — I10 Essential (primary) hypertension: Secondary | ICD-10-CM | POA: Diagnosis not present

## 2020-03-26 LAB — CUP PACEART INCLINIC DEVICE CHECK
Brady Statistic RA Percent Paced: 44 %
Brady Statistic RV Percent Paced: 33 %
Date Time Interrogation Session: 20210922114446
Implantable Lead Implant Date: 20200129
Implantable Lead Implant Date: 20200129
Implantable Lead Location: 753859
Implantable Lead Location: 753860
Implantable Lead Model: 377
Implantable Lead Model: 377
Implantable Lead Serial Number: 80947537
Implantable Lead Serial Number: 80970966
Implantable Pulse Generator Implant Date: 20200129
Lead Channel Impedance Value: 507 Ohm
Lead Channel Impedance Value: 702 Ohm
Lead Channel Pacing Threshold Amplitude: 0.5 V
Lead Channel Pacing Threshold Amplitude: 0.8 V
Lead Channel Pacing Threshold Pulse Width: 0.4 ms
Lead Channel Pacing Threshold Pulse Width: 0.4 ms
Lead Channel Sensing Intrinsic Amplitude: 14.8 mV
Lead Channel Sensing Intrinsic Amplitude: 3.3 mV
Lead Channel Setting Pacing Amplitude: 1.1 V
Lead Channel Setting Pacing Amplitude: 2.4 V
Lead Channel Setting Pacing Pulse Width: 0.4 ms
Pulse Gen Model: 407145
Pulse Gen Serial Number: 69503797

## 2020-03-26 NOTE — Patient Instructions (Signed)
Medication Instructions:  Your physician recommends that you continue on your current medications as directed. Please refer to the Current Medication list given to you today.  Labwork: None ordered.  Testing/Procedures: None ordered.  Follow-Up: Your physician wants you to follow-up in: one year with Dr. Lovena Le.   You will receive a reminder letter in the mail two months in advance. If you don't receive a letter, please call our office to schedule the follow-up appointment.  Remote monitoring is used to monitor your Pacemaker from home. This monitoring reduces the number of office visits required to check your device to one time per year. It allows [place] to keep an eye on the functioning of your device to ensure it is working properly. You are scheduled for a device check from home on 05/19/2020. You may send your transmission at any time that day. If you have a wireless device, the transmission will be sent automatically. After your physician reviews your transmission, you will receive a postcard with your next transmission date.  Any Other Special Instructions Will Be Listed Below (If Applicable).  If you need a refill on your cardiac medications before your next appointment, please call your pharmacy.

## 2020-03-26 NOTE — Progress Notes (Signed)
HPI [name] returns today for followup of his CHB, s/p PPM insertion. He has prostate CA and is s/p XRT. He has preserved LV function. He has not had any problems with his PPM. He admits to dietary indiscretion. He would like to lose weight. Allergies  Allergen Reactions  . Contrast Media [Iodinated Diagnostic Agents] Palpitations    TACHYCARDIA- patient states he has tolerated newer agents since this reaction >30 yrs ago     Current Outpatient Medications  Medication Sig Dispense Refill  . allopurinol (ZYLOPRIM) 300 MG tablet Take 1 tablet (300 mg total) by mouth daily. 90 tablet 1  . amLODipine (NORVASC) 10 MG tablet Take 1 tablet (10 mg total) by mouth daily. 90 tablet 1  . Calcium Carb-Cholecalciferol (CALCIUM 600 + D PO) Take 1 tablet by mouth daily.    [name] Kitchen docusate sodium (COLACE) 100 MG capsule Take 100 mg by mouth daily.     . metoCLOPramide (REGLAN) 10 MG tablet TAKE 1 TABLET (10 MG TOTAL) BY MOUTH AT BEDTIME AS NEEDED FOR NAUSEA. 90 tablet 0  . omeprazole (PRILOSEC) 40 MG capsule Take 1 capsule (40 mg total) by mouth daily. 90 capsule 1  . Respiratory Therapy Supplies (FLUTTER) DEVI 1 each by Does not apply route in the morning and at bedtime. 1 each 0  . simvastatin (ZOCOR) 20 MG tablet Take 1 tablet (20 mg total) by mouth at bedtime. 90 tablet 3  . tamsulosin (FLOMAX) 0.4 MG CAPS capsule Take 0.4 mg by mouth daily.     . valsartan-hydrochlorothiazide (DIOVAN-HCT) 320-25 MG tablet Take 1 tablet by mouth daily. 90 tablet 1  . vitamin B-12 (CYANOCOBALAMIN) 1000 MCG tablet Take 1,000 mcg by mouth daily.      No current facility-administered medications for this visit.     Past Medical History:  Diagnosis Date  . Acute meniscal tear of knee left  . Arthritis    generalized  . Calcaneal spur   . Colon cancer [facility]) 1983   surgical removed. did not receive radiation or chemotherapy.  . Coronary artery disease    dr Stanford Breed  . Cramps of lower extremity    in the  mornings occ.  . Depression   . Diverticulosis of colon (without mention of hemorrhage)   . Echocardiogram findings abnormal, without diagnosis 04-23-2010   normal LV function, mod. left atrial enlargement, mild right artrial enlargement and grade 1 diastolic dysfunction  . Edema of lower extremity    ankles, elevates legs  . Elevated prostate specific antigen (PSA)   . Esophageal reflux   . External hemorrhoids   . Frequency of urination    followed by dr dalhstedt  . Gout   . Hiatal hernia   . History of colon cancer 1988  . Hyperlipidemia   . Lumbar spondylolysis   . Malaria 1964  . Normal nuclear stress test 04-23-2010   ef 51%,  septal hypokinesis, normal perfusion  . OSA (obstructive sleep apnea)    does not use CPAP   . Paraesophageal hernia    large  . Personal history of colonic polyps 08/21/2009   TUBULAR ADENOMAS  . Pneumonia    history of; last episode 2015  . Postgastric surgery syndromes   . Presence of permanent cardiac pacemaker   . Prostate cancer ([place]) 2016  . Unspecified essential hypertension   . Unspecified vitamin D deficiency   . Ventral hernia   . Vitamin B12 deficiency     ROS:  All systems reviewed and negative except as noted in the HPI.   Past Surgical History:  Procedure Laterality Date  . BRONCHIAL WASHINGS  10/30/2019   Procedure: BRONCHIAL WASHINGS;  Surgeon: Julian Hy, DO;  Location: [facility];  Service: Endoscopy;;  . CARDIAC CATHETERIZATION    . CATARACT EXTRACTION W/ INTRAOCULAR LENS  IMPLANT, BILATERAL Bilateral   . COLONOSCOPY    . ESOPHAGOGASTRODUODENOSCOPY    . KNEE ARTHROSCOPY Right 2007  . KNEE ARTHROSCOPY  07/13/2011   Procedure: ARTHROSCOPY KNEE;  Surgeon: Cynda Familia;  Location: [place];  Service: Orthopedics;  Laterality: Left;  partial menisectomy with chondrylplasty  . KNEE SURGERY Left   . LAMINECTOMY AND MICRODISCECTOMY LUMBAR SPINE  MARCH  2008   L3 -  4  . LAPAROSCOPIC  INCISIONAL / UMBILICAL / VENTRAL HERNIA REPAIR  2006  . PACEMAKER IMPLANT N/A 08/02/2018   Procedure: PACEMAKER IMPLANT;  Surgeon: Evans Lance, MD;  Location: [facility];  Service: Cardiovascular;  Laterality: N/A;  . PROSTATE BIOPSY    . RADIOACTIVE SEED IMPLANT N/A 02/12/2019   Procedure: RADIOACTIVE SEED IMPLANT/BRACHYTHERAPY IMPLANT;  Surgeon: Franchot Gallo, MD;  Location: [facility];  Service: Urology;  Laterality: N/A;  90 MINS  . SHOULDER ARTHROSCOPY Right 05-23-2007  . SIGMOID COLECTOMY FOR CANCER  1989  . SPACE OAR INSTILLATION N/A 02/12/2019   Procedure: SPACE OAR INSTILLATION;  Surgeon: Franchot Gallo, MD;  Location: [facility];  Service: Urology;  Laterality: N/A;  . TOTAL KNEE ARTHROPLASTY Left 05/23/2015   Procedure: LEFT TOTAL KNEE ARTHROPLASTY;  Surgeon: Sydnee Cabal, MD;  Location: [facility];  Service: Orthopedics;  Laterality: Left;  [name] Kitchen VERTICAL BANDED GASTROPLASTY  1986  . VIDEO BRONCHOSCOPY N/A 10/30/2019   Procedure: VIDEO BRONCHOSCOPY WITH BRONICAL ALVEROLAR LAVAGE WITHOUT FLUORO;  Surgeon: Julian Hy, DO;  Location: [facility];  Service: Endoscopy;  Laterality: N/A;     Family History  Problem Relation Age of Onset  . Stroke Paternal Grandfather   . Heart disease Paternal Grandfather   . Esophagitis Father        died from perforated esophagus  . Breast cancer Mother   . Colon cancer Maternal Grandmother        questionable  . Esophageal cancer Neg Hx   . Prostate cancer Neg Hx   . Stomach cancer Neg Hx      Social History   Socioeconomic History  . Marital status: Married    Spouse name: Not on file  . Number of children: 4  . Years of education: Not on file  . Highest education level: Not on file  Occupational History  . Occupation: retired    Fish farm manager: [employer]  Tobacco Use  . Smoking status: Former Smoker    Packs/day: 2.00    Years: 20.00    Pack years: 40.00    Types: Cigarettes    Quit date: 07/05/1974    Years since  quitting: 45.7  . Smokeless tobacco: Never Used  Vaping Use  . Vaping Use: Never used  Substance and Sexual Activity  . Alcohol use: Yes    Alcohol/week: 10.0 standard drinks    Types: 10 Glasses of wine per week    Comment: socially; also 1-2 glasses of wine or beer every other day   . Drug use: No  . Sexual activity: Not Currently  Other Topics Concern  . Not on file  Social History Narrative   Recently widowed   Former Smoker  Alcohol use-yes 1-2 drinks per day      Occupation: Retired Midwife      Originally from [place], [place] - in [place] > 10 yrs as of 2017      Social Determinants of Health   Financial Resource Strain:   . Difficulty of Paying Living Expenses: Not on file  Food Insecurity:   . Worried About Charity fundraiser in the Last Year: Not on file  . Ran Out of Food in the Last Year: Not on file  Transportation Needs:   . Lack of Transportation (Medical): Not on file  . Lack of Transportation (Non-Medical): Not on file  Physical Activity:   . Days of Exercise per Week: Not on file  . Minutes of Exercise per Session: Not on file  Stress:   . Feeling of Stress : Not on file  Social Connections:   . Frequency of Communication with Friends and Family: Not on file  . Frequency of Social Gatherings with Friends and Family: Not on file  . Attends Religious Services: Not on file  . Active Member of Clubs or Organizations: Not on file  . Attends Archivist Meetings: Not on file  . Marital Status: Not on file  Intimate Partner Violence:   . Fear of Current or Ex-Partner: Not on file  . Emotionally Abused: Not on file  . Physically Abused: Not on file  . Sexually Abused: Not on file     BP 126/84   Pulse 60   Ht 5\' 8"  (1.727 m)   Wt 256 lb 9.6 oz (116.4 kg)   SpO2 95%   BMI 39.02 kg/m   Physical Exam:  Well appearing but overweight 79 yo man, NAD HEENT: Unremarkable Neck:  6 cm JVD, no thyromegally Lymphatics:  No adenopathy Back:  No CVA  tenderness Lungs:  Clear with no wheezes HEART:  Regular rate rhythm, no murmurs, no rubs, no clicks Abd:  soft, positive bowel sounds, no organomegally, no rebound, no guarding Ext:  2 plus pulses, no edema, no cyanosis, no clubbing Skin:  No rashes no nodules Neuro:  CN II through XII intact, motor grossly intact  EKG - NSR  DEVICE  Normal device function.  See PaceArt for details.   Assess/Plan: 1. Obesity - we discussed dieting includingintermittent fasting. 2. PPM -his biotronik DDD PM is working normally.  3. HTN - his bp is minimally elevated. No change in meds. I encouraged him on weight loss. 4. Dyslipidemia - he will continue his statin therapy.  Marcus Overlie Eniola Cerullo,MD

## 2020-04-04 ENCOUNTER — Other Ambulatory Visit: Payer: Self-pay | Admitting: Adult Health

## 2020-04-04 DIAGNOSIS — I10 Essential (primary) hypertension: Secondary | ICD-10-CM

## 2020-04-04 DIAGNOSIS — E7800 Pure hypercholesterolemia, unspecified: Secondary | ICD-10-CM

## 2020-04-04 DIAGNOSIS — J479 Bronchiectasis, uncomplicated: Secondary | ICD-10-CM | POA: Diagnosis not present

## 2020-04-05 DIAGNOSIS — G4733 Obstructive sleep apnea (adult) (pediatric): Secondary | ICD-10-CM | POA: Diagnosis not present

## 2020-04-08 ENCOUNTER — Other Ambulatory Visit: Payer: Self-pay | Admitting: Adult Health

## 2020-04-08 ENCOUNTER — Telehealth: Payer: Self-pay | Admitting: Adult Health

## 2020-04-08 NOTE — Telephone Encounter (Signed)
Pt is calling in stating that where he was scheduled to have his MRI done at will not do it due to him having a pacemaker and he would like to discuss this at his next appt which 04/10/2020 virtual visit with [facility] as to what he needs to do next.

## 2020-04-09 ENCOUNTER — Telehealth: Payer: Self-pay | Admitting: Pharmacist

## 2020-04-09 ENCOUNTER — Telehealth: Payer: Self-pay | Admitting: Adult Health

## 2020-04-09 ENCOUNTER — Other Ambulatory Visit: Payer: Self-pay | Admitting: Adult Health

## 2020-04-09 DIAGNOSIS — R413 Other amnesia: Secondary | ICD-10-CM

## 2020-04-09 NOTE — Telephone Encounter (Signed)
Pt contacted and confirmed he knew he could do the CT and not the MRI. Pt stated understanding.   CT is scheduled for 05/01/2020.

## 2020-04-09 NOTE — Telephone Encounter (Signed)
Pt called stating CT scan was cancelled due to patient have pace maker. Pt want to know what is the next step to do. Please call pt [phone].

## 2020-04-09 NOTE — Telephone Encounter (Signed)
Patient informed of the message below and stated he has an appt scheduled on 10/28.

## 2020-04-09 NOTE — Telephone Encounter (Signed)
I am sorry about that. I have changed it to a CT

## 2020-04-09 NOTE — Telephone Encounter (Signed)
His MRI was cancelled but he can have a CT scan

## 2020-04-09 NOTE — Chronic Care Management (AMB) (Signed)
I left the patient a message about his upcoming appointment on 04/10/2020 @ 2:00 pm with the clinical pharmacist. He was asked to please have all medication on had to review the pharmacist. I was not able to the initials question.

## 2020-04-10 ENCOUNTER — Ambulatory Visit: Payer: Medicare Other | Admitting: Pharmacist

## 2020-04-10 DIAGNOSIS — I10 Essential (primary) hypertension: Secondary | ICD-10-CM

## 2020-04-10 DIAGNOSIS — E7800 Pure hypercholesterolemia, unspecified: Secondary | ICD-10-CM

## 2020-04-10 NOTE — Chronic Care Management (AMB) (Signed)
Chronic Care Management Pharmacy  Name: Marcus [name]  MRN: [id number] DOB: [birth year]  Initial Questions: 1. Have you seen any other providers since your last visit? n/a 2. Any changes in your medicines or health? No   Chief Complaint/ HPI  Marcus [name],  79 y.o. , male presents for their Initial CCM visit with the clinical pharmacist via telephone due to COVID-19 Pandemic.  PCP : Dorothyann Peng, NP  Their chronic conditions include: HTN, HLD, GERD, gout, depression, BPH, constipation  Office Visits: -03/05/20 Dorothyann Peng, NP: Patient presented for memory loss follow up. Vitamin D was low and recommended supplementation. All other labs WNL. Place referred to PT and order for MRI. Dizziness may be due to vertigo. Referral to weight management for obesity.  -02/28/20 Dorothyann Peng, NP: Patient presented for diarrhea concerns. Recommended to stop magnesium, which may have caused diarrhea and counseled on the brat diet. Patient will start back on Prilosec.  Consult Visit: -03/26/20 Cristopher Peru, MD (cardiology): Patient presented for follow up for pacemaker check and AV block. No medication changes. Follow up in 1 year.  -03/20/20 Levy Pupa: Patient presented for low back pain follow up. Unable to access notes.  -12/13/19 Noemi Chapel, DO (pulmonary): Patient presented for cough and GERD follow up. Patient denies SOB and feels much better. Follow up PRN.  -11/28/19 Alonza Bogus, PA-C (gastro): Patient presented for GERD follow up. GERD is under control if patient doesn't eat after 7pm. Prescribed Reglan 10 mg at night PRN. Follow up in 1 year.  -11/20/19 Wyn Quaker, NP (pulmonary): Patient presented for chronic cough follow up. Recommended adding guaifenesin twice daily. Continue PPI and lifestyle modifications. Follow up in 6 weeks.  -10/30/19 Noemi Chapel, DO (pulmonary): Patient presented in hospital for video bronchoscopy.  4/16 & 10/23/19 Patient presented to the ED for pain in  left finger. Medications: Outpatient Encounter Medications as of 04/10/2020  Medication Sig  . allopurinol (ZYLOPRIM) 300 MG tablet Take 1 tablet (300 mg total) by mouth daily.  [name] 10 MG tablet Take 1 tablet (10 mg total) by mouth daily.  . Calcium Carb-Cholecalciferol (CALCIUM 600 + D PO) Take 1 tablet by mouth daily. 800 units of vitamin D  . docusate sodium (COLACE) 100 MG capsule Take 100 mg by mouth daily.   . metoCLOPramide (REGLAN) 10 MG tablet TAKE 1 TABLET (10 MG TOTAL) BY MOUTH AT BEDTIME AS NEEDED FOR NAUSEA. (Patient taking differently: Take 10 mg by mouth in the morning. )  . Multiple Vitamin (MULTIVITAMIN) tablet Take 1 tablet by mouth daily.  [name] Kitchen omeprazole (PRILOSEC) 40 MG capsule Take 1 capsule (40 mg total) by mouth daily.  [name] Kitchen oxymetazoline (AFRIN) 0.05 % nasal spray Place 1 spray into both nostrils 2 (two) times daily.  . potassium gluconate 595 (99 K) MG TABS tablet Take 595 mg by mouth daily.  . simvastatin (ZOCOR) 20 MG tablet Take 1 tablet (20 mg total) by mouth at bedtime.  . tamsulosin (FLOMAX) 0.4 MG CAPS capsule Take 0.4 mg by mouth daily.   . valsartan-hydrochlorothiazide (DIOVAN-HCT) 320-25 MG tablet Take 1 tablet by mouth daily.  . vitamin B-12 (CYANOCOBALAMIN) 1000 MCG tablet Take 1,000 mcg by mouth daily.   [name] Kitchen Respiratory Therapy Supplies (FLUTTER) DEVI 1 each by Does not apply route in the morning and at bedtime.   No facility-administered encounter medications on file as of 04/10/2020.   Patient has lived in [place] for the past 20 years and is originally from [place],  [place]. Patient has 2 daughters and 2 sons and 2 grandchildren. He lives with one son. He is a retired Midwife.  Patient does his own cooking and typically eats meat, potatoes, pasta and does not eat "enough vegetables or fruit". He doesn't take the time to prepare them. Him and his son share the cleaning.  Patient enjoys watching TV and tends to a garden in the summer and  fall. His garden is 10 raised beds and he grows tomatoes, peppers, onions, potatoes, broccoli, cucumbers, etc. He goes to high school football games and other grandchildren sporting events.  Patient continues to drive.  Patient does not sleep enough, usually only 5 hours of sleep per night. He sometimes has trouble falling asleep but he always wakes up at 5 due to his career. He doesn't wake up during the night but does admit to ocassional naps during the day and feels tired.    Patient does not exercise and is limited by some back pain.  Current Diagnosis/Assessment:  Goals Addressed            This Visit's Progress   . Pharmacy Care Plan       CARE PLAN ENTRY (see longitudinal plan of care for additional care plan information)  Current Barriers:  . Chronic Disease Management support, education, and care coordination needs related to Hypertension, Hyperlipidemia, GERD, BPH, Gout, and constipation   Hypertension BP Readings from Last 3 Encounters:  03/26/20 126/84  03/05/20 120/82  02/28/20 122/60   . Pharmacist Clinical Goal(s): o Over the next 120 days, patient will work with PharmD and providers to maintain BP goal <140/90 . Current regimen:  . Amlodipine 10 mg 1 tablet daily . Valsartan-HCTZ 320-25 mg 1 tablet daily . Interventions: o We discussed the importance of checking blood pressure at home while taking blood pressure medicines o We discussed the impact of exercise on blood pressure lowering . Patient self care activities - Over the next 120 days, patient will: o Obtain blood pressure cuff o Check blood pressure weekly, document, and provide at future appointments o Ensure daily salt intake < 2300 mg/day  Hyperlipidemia Lab Results  Component Value Date/Time   LDLCALC 65 03/04/2017 08:08 AM   LDLCALC 65 01/01/2011 04:09 PM   . Pharmacist Clinical Goal(s): o Over the next 120 days, patient will work with PharmD and providers to maintain LDL goal < 70 . Current  regimen:  o Simvastatin 20 mg 1 tablet at bedtime . Interventions: o We discussed increasing fiber intake to 10-25 g/day in order to lower cholesterol . Patient self care activities - Over the next 120 days, patient will: o Continue current medications  GERD . Pharmacist Clinical Goal(s): o Over the next 120 days, patient will work with PharmD and providers to manage symptoms of heartburn/indigestion . Current regimen:  . Omeprazole 40 mg 1 capsule daily . Metoclopramide 10 mg 1 tablet every morning . Interventions: o We discussed non medication related ways to avoid heartburn such as avoiding eating 2 hours before bedtime, elevating the head of your bed, avoiding triggering foods such as spicy or acidic foods . Patient self care activities - Over the next 120 days, patient will: o Continue current medications o Continue to avoid eating after 6:30-7 each night to avoid heartburn  Gout . Pharmacist Clinical Goal(s) o Over the next 120 days, patient will work with PharmD and providers to avoid gout flare ups . Current regimen:  o Allopurinol 300 mg 1 tablet daily .  Interventions: o We discussed avoiding alcohol and foods that can trigger gout such as soda, shellfish, organ meats, etc. . Patient self care activities - Over the next 120 days, patient will: o Continue current medications  BPH . Pharmacist Clinical Goal(s) o Over the next 120 days, patient will work with PharmD and providers to manage symptoms of BPH . Current regimen:  o Tamsulosin 0.4 mg 1 capsule daily . Interventions: o We discussed avoiding alcohol and foods that can trigger gout such as soda, shellfish, organ meats, etc. . Patient self care activities - Over the next 120 days, patient will: o Continue current medications  Constipation . Pharmacist Clinical Goal(s) o Over the next 120 days, patient will work with PharmD and providers to avoid constipation . Current regimen:  o Docusate 100 mg 1 tablet  daily . Interventions: o We discussed drinking plenty of water and increasing fiber intake through foods such as green leafy vegetables, fruit, and oatmeal . Patient self care activities - Over the next 120 days, patient will: o Continue current medications o Consider fiber supplement if constipation worsens or unable to get adequate fiber through diet  Medication management . Pharmacist Clinical Goal(s): o Over the next 120 days, patient will work with PharmD and providers to maintain optimal medication adherence . [facility] . Interventions o Comprehensive medication review performed. o Continue current medication management strategy . Patient self care activities - Over the next 120 days, patient will: o Take medications as prescribed o Report any questions or concerns to PharmD and/or provider(s)  Initial goal documentation       SDOH Interventions     Most Recent Value  SDOH Interventions  Financial Strain Interventions Intervention Not Indicated  Transportation Interventions Intervention Not Indicated      Hypertension   BP goal is:  <140/90  Office blood pressures are  BP Readings from Last 3 Encounters:  03/26/20 126/84  03/05/20 120/82  02/28/20 122/60   Patient checks BP at home never  Patient has failed these meds in the past: Bystolic (unknown) Patient is currently controlled on the following medications:  . Amlodipine 10 mg 1 tablet daily . Valsartan-HCTZ 320-25 mg 1 tablet daily  We discussed diet and exercise extensively  -Diet: Patient cooks from scratch and rarely adds salt -Exercise: encouraged him to walk  -We discussed the importance of checking blood pressure at home regularly especially if he is feeling symptomatic.  Plan Endorses dizziness/lightheadedness but he reports it may be related to vertigo. Continue current medications     Hyperlipidemia   LDL goal < 70  Lipid Panel     Component Value Date/Time   CHOL 121  03/04/2017 0808   CHOL 126 01/01/2011 1609   TRIG 62.0 03/04/2017 0808   TRIG 90 01/01/2011 1609   HDL 44.30 03/04/2017 0808   HDL 43 01/01/2011 1609   LDLCALC 65 03/04/2017 0808   LDLCALC 65 01/01/2011 1609    Hepatic Function Latest Ref Rng & Units 02/07/2019 12/06/2018 03/04/2017  Total Protein 6.5 - 8.1 g/dL 7.2 7.2 6.3  Albumin 3.5 - 5.0 g/dL 3.7 3.8 3.8  AST 15 - 41 U/L 37 25 16  ALT 0 - 44 U/L '16 22 14  ' Alk Phosphatase 38 - 126 U/L 55 48 57  Total Bilirubin 0.3 - 1.2 mg/dL 1.1 0.6 0.6  Bilirubin, Direct 0.0 - 0.3 mg/dL - - 0.1     The ASCVD Risk score Mikey Bussing DC Jr., et al., 2013) failed to  calculate for the following reasons:   The 2013 ASCVD risk score is only valid for ages 74 to 55   Patient has failed these meds in past: none Patient is currently controlled on the following medications:  . Simvastatin 20 mg 1 tablet at bedtime  We discussed:  diet and exercise extensively  Plan Recommend repeat lipid panel. Continue current medications  GERD   Patient has failed these meds in past:  Patient is currently controlled on the following medications:  . Omeprazole 40 mg 1 capsule daily . Metoclopramide 10 mg 1 tablet every morning  We discussed:  Non pharmaclogic (avoiding eating 2 hours prior to bedtime, avoiding spicy/fatty/acidic foods, elevating head of bed); patient reports if he stops eating at 6:30-7 he doesn't have any problems  Plan  Continue current medications   Gout    Lab Results  Component Value Date   LABURIC 4.3 09/26/2013   LABURIC 5.8 02/22/2008   LABURIC 7.2 10/23/2007   Patient is currently controlled on:  . Allopurinol 300 mg 1 tablet daily  Plan Recommend repeat uric acid level. Continue current medications  Depression   Depression screen [facility] 2/9 03/05/2020 03/24/2018 08/07/2015  Decreased Interest 1 0 0  Down, Depressed, Hopeless 0 0 0  PHQ - 2 Score 1 0 0  Some recent data might be hidden    Patient has failed these meds in past:  bupropion (dizziness), citalopram, fluoxetine, trazodone Patient is currently controlled on the following medications:  . No medications   Plan  Continue without medications.  BPH   PSA  Date Value Ref Range Status  05/14/2019 <0.015 NG/ML  Final  09/05/2017 7.40  Final  10/10/2015 5.41 (H) 0.10 - 4.00 ng/mL Final     Patient has failed these meds in past:  Patient is currently controlled on the following medications:  . Tamsulosin 0.4 mg 1 capsule daily  We discussed:  Patient is almost due for repeat PSA.  Plan Recommend repeat PSA. Continue current medications  Constipation   Patient has failed these meds in past:  Patient is currently controlled on the following medications:  . Docusate 100 mg 1 tablet daily  We discussed: drinking plenty of water and increasing soluble fiber intake (green leafy vegetables, fruit and oatmeal)  Plan  Continue current medications   OTC/vitamins   Patient is currently on the following medications:  [name] Kitchen Vitamin B12 1000 mcg 1 tablet daily . Calcium & vitamin D 600 mg & 800 units 1 tablet daily . Potassium gluconate 595 mg 1 tablet daily . Multivitamin 1 tablet daily   Plan  Continue current medications  Vaccines   Reviewed and discussed patient's vaccination history.    Immunization History  Administered Date(s) Administered  . Influenza Whole 06/05/2007, 04/30/2008, 03/31/2009  . Influenza, High Dose Seasonal PF 06/19/2014, 07/30/2015, 03/03/2017  . Influenza,inj,quad, With Preservative 04/04/2017  . PFIZER SARS-COV-2 Vaccination 08/13/2019, 09/11/2019  . Pneumococcal Conjugate-13 06/19/2014, 07/30/2015  . Pneumococcal Polysaccharide-23 12/11/2003, 03/03/2017  . Td 03/31/2009  . Tdap 10/19/2019    Plan  Recommended patient receive Shingrix and influenza vaccine in office/at pharmacy.   Medication Management   [facility] Uses pill box? No - lined up and takes every morning Pt  endorses 100% compliance  We discussed: Current pharmacy is preferred with insurance plan and patient is satisfied with pharmacy services  Plan  Continue current medication management strategy   Follow up: 4 month phone visit   Jeni Salles, PharmD  Contractor at [facility]

## 2020-04-15 DIAGNOSIS — M961 Postlaminectomy syndrome, not elsewhere classified: Secondary | ICD-10-CM | POA: Diagnosis not present

## 2020-04-15 DIAGNOSIS — M5136 Other intervertebral disc degeneration, lumbar region: Secondary | ICD-10-CM | POA: Diagnosis not present

## 2020-04-15 DIAGNOSIS — J479 Bronchiectasis, uncomplicated: Secondary | ICD-10-CM | POA: Diagnosis not present

## 2020-04-16 ENCOUNTER — Other Ambulatory Visit: Payer: Self-pay | Admitting: Adult Health

## 2020-04-16 MED ORDER — BLOOD PRESSURE MONITOR/L CUFF MISC: 1.0000 | Freq: Once | 0 refills | Status: AC

## 2020-04-16 NOTE — Patient Instructions (Addendum)
Hello Babe,  It was so lovely getting to speak with you on the phone! As we had discussed, once you get a blood pressure cuff please check once a week or a few times a month to make sure that your medicines are working properly, especially when you are feeling dizzy or lightheaded. We also talked about ways to avoid heartburn and I attached some information about what kinds of foods can trigger it, hope it helps.  Please give me a call if you need anything before our next touch base!  Best, Maddie  Jeni Salles, PharmD Clinical Pharmacist [facility]   Visit Information  Goals Addressed            This Visit's Progress   . Pharmacy Care Plan       CARE PLAN ENTRY (see longitudinal plan of care for additional care plan information)  Current Barriers:  . Chronic Disease Management support, education, and care coordination needs related to Hypertension, Hyperlipidemia, GERD, BPH, Gout, and constipation   Hypertension BP Readings from Last 3 Encounters:  03/26/20 126/84  03/05/20 120/82  02/28/20 122/60   . Pharmacist Clinical Goal(s): o Over the next 120 days, patient will work with PharmD and providers to maintain BP goal <140/90 . Current regimen:  . Amlodipine 10 mg 1 tablet daily . Valsartan-HCTZ 320-25 mg 1 tablet daily . Interventions: o We discussed the importance of checking blood pressure at home while taking blood pressure medicines o We discussed the impact of exercise on blood pressure lowering . Patient self care activities - Over the next 120 days, patient will: o Obtain blood pressure cuff o Check blood pressure weekly, document, and provide at future appointments o Ensure daily salt intake < 2300 mg/day  Hyperlipidemia Lab Results  Component Value Date/Time   LDLCALC 65 03/04/2017 08:08 AM   LDLCALC 65 01/01/2011 04:09 PM   . Pharmacist Clinical Goal(s): o Over the next 120 days, patient will work with PharmD  and providers to maintain LDL goal < 70 . Current regimen:  o Simvastatin 20 mg 1 tablet at bedtime . Interventions: o We discussed increasing fiber intake to 10-25 g/day in order to lower cholesterol . Patient self care activities - Over the next 120 days, patient will: o Continue current medications  GERD . Pharmacist Clinical Goal(s): o Over the next 120 days, patient will work with PharmD and providers to manage symptoms of heartburn/indigestion . Current regimen:  . Omeprazole 40 mg 1 capsule daily . Metoclopramide 10 mg 1 tablet every morning . Interventions: o We discussed non medication related ways to avoid heartburn such as avoiding eating 2 hours before bedtime, elevating the head of your bed, avoiding triggering foods such as spicy or acidic foods . Patient self care activities - Over the next 120 days, patient will: o Continue current medications o Continue to avoid eating after 6:30-7 each night to avoid heartburn  Gout . Pharmacist Clinical Goal(s) o Over the next 120 days, patient will work with PharmD and providers to avoid gout flare ups . Current regimen:  o Allopurinol 300 mg 1 tablet daily . Interventions: o We discussed avoiding alcohol and foods that can trigger gout such as soda, shellfish, organ meats, etc. . Patient self care activities - Over the next 120 days, patient will: o Continue current medications  BPH . Pharmacist Clinical Goal(s) o Over the next 120 days, patient will work with PharmD and providers to manage symptoms of BPH . Current  regimen:  o Tamsulosin 0.4 mg 1 capsule daily . Interventions: o We discussed avoiding alcohol and foods that can trigger gout such as soda, shellfish, organ meats, etc. . Patient self care activities - Over the next 120 days, patient will: o Continue current medications  Constipation . Pharmacist Clinical Goal(s) o Over the next 120 days, patient will work with PharmD and providers to avoid  constipation . Current regimen:  o Docusate 100 mg 1 tablet daily . Interventions: o We discussed drinking plenty of water and increasing fiber intake through foods such as green leafy vegetables, fruit, and oatmeal . Patient self care activities - Over the next 120 days, patient will: o Continue current medications o Consider fiber supplement if constipation worsens or unable to get adequate fiber through diet  Medication management . Pharmacist Clinical Goal(s): o Over the next 120 days, patient will work with PharmD and providers to maintain optimal medication adherence . [facility] . Interventions o Comprehensive medication review performed. o Continue current medication management strategy . Patient self care activities - Over the next 120 days, patient will: o Take medications as prescribed o Report any questions or concerns to PharmD and/or provider(s)  Initial goal documentation        Mr. Schar was given information about Chronic Care Management services today including:  1. CCM service includes personalized support from designated clinical staff supervised by his physician, including individualized plan of care and coordination with other care providers 2. 24/7 contact phone numbers for assistance for urgent and routine care needs. 3. Standard insurance, coinsurance, copays and deductibles apply for chronic care management only during months in which we provide at least 20 minutes of these services. Most insurances cover these services at 100%, however patients may be responsible for any copay, coinsurance and/or deductible if applicable. This service may help you avoid the need for more expensive face-to-face services. 4. Only one practitioner may furnish and bill the service in a calendar month. 5. The patient may stop CCM services at any time (effective at the end of the month) by phone call to the office staff.  Patient agreed to services and verbal consent  obtained.   The patient verbalized understanding of instructions provided today and agreed to receive a mailed copy of patient instruction and/or educational materials. Telephone follow up appointment with pharmacy team member scheduled for: 4 months   Food Choices for Gastroesophageal Reflux Disease, Adult When you have gastroesophageal reflux disease (GERD), the foods you eat and your eating habits are very important. Choosing the right foods can help ease your discomfort. Think about working with a nutrition specialist (dietitian) to help you make good choices. What are tips for following this plan?  Meals  Choose healthy foods that are low in fat, such as fruits, vegetables, whole grains, low-fat dairy products, and lean meat, fish, and poultry.  Eat small meals often instead of 3 large meals a day. Eat your meals slowly, and in a place where you are relaxed. Avoid bending over or lying down until 2-3 hours after eating.  Avoid eating meals 2-3 hours before bed.  Avoid drinking a lot of liquid with meals.  Cook foods using methods other than frying. Bake, grill, or broil food instead.  Avoid or limit: ? Chocolate. ? Peppermint or spearmint. ? Alcohol. ? Pepper. ? Black and decaffeinated coffee. ? Black and decaffeinated tea. ? Bubbly (carbonated) soft drinks. ? Caffeinated energy drinks and soft drinks.  Limit high-fat foods such as: ?  Fatty meat or fried foods. ? Whole milk, cream, butter, or ice cream. ? Nuts and nut butters. ? Pastries, donuts, and sweets made with butter or shortening.  Avoid foods that cause symptoms. These foods may be different for everyone. Common foods that cause symptoms include: ? Tomatoes. ? Oranges, lemons, and limes. ? Peppers. ? Spicy food. ? Onions and garlic. ? Vinegar. Lifestyle  Maintain a healthy weight. Ask your doctor what weight is healthy for you. If you need to lose weight, work with your doctor to do so safely.  Exercise  for at least 30 minutes for 5 or more days each week, or as told by your doctor.  Wear loose-fitting clothes.  Do not smoke. If you need help quitting, ask your doctor.  Sleep with the head of your bed higher than your feet. Use a wedge under the mattress or blocks under the bed frame to raise the head of the bed. Summary  When you have gastroesophageal reflux disease (GERD), food and lifestyle choices are very important in easing your symptoms.  Eat small meals often instead of 3 large meals a day. Eat your meals slowly, and in a place where you are relaxed.  Limit high-fat foods such as fatty meat or fried foods.  Avoid bending over or lying down until 2-3 hours after eating.  Avoid peppermint and spearmint, caffeine, alcohol, and chocolate. This information is not intended to replace advice given to you by your health care provider. Make sure you discuss any questions you have with your health care provider. Document Revised: 10/12/2018 Document Reviewed: 07/27/2016 Elsevier Patient Education  [employer].

## 2020-04-22 ENCOUNTER — Encounter: Payer: Self-pay | Admitting: Adult Health

## 2020-04-22 ENCOUNTER — Ambulatory Visit (INDEPENDENT_AMBULATORY_CARE_PROVIDER_SITE_OTHER): Payer: Medicare Other | Admitting: Adult Health

## 2020-04-22 ENCOUNTER — Other Ambulatory Visit: Payer: Self-pay

## 2020-04-22 VITALS — BP 112/62 | HR 60 | Temp 98.3°F | Ht 68.0 in | Wt 253.8 lb

## 2020-04-22 DIAGNOSIS — H8113 Benign paroxysmal vertigo, bilateral: Secondary | ICD-10-CM

## 2020-04-22 MED ORDER — MECLIZINE HCL 25 MG PO TABS: 25.0000 mg | ORAL_TABLET | Freq: Three times a day (TID) | ORAL | 0 refills | Status: DC | PRN

## 2020-04-22 NOTE — Progress Notes (Signed)
   Subjective:    Patient ID: Marcus [name], male    DOB: 1940-12-01, 79 y.o.   MRN: [id number]  HPI    Review of Systems     Objective:   Physical Exam        Assessment & Plan:

## 2020-04-22 NOTE — Progress Notes (Signed)
Subjective:    Patient ID: Marcus [name], male    DOB: 1941-07-01, 79 y.o.   MRN: [id number]  HPI 79 year old male who  has a past medical history of Acute meniscal tear of knee (left), Arthritis, Calcaneal spur, Colon cancer ([place]) (1983), Coronary artery disease, Cramps of lower extremity, Depression, Diverticulosis of colon (without mention of hemorrhage), Echocardiogram findings abnormal, without diagnosis (04-23-2010), Edema of lower extremity, Elevated prostate specific antigen (PSA), Esophageal reflux, External hemorrhoids, Frequency of urination, Gout, Hiatal hernia, History of colon cancer (1988), Hyperlipidemia, Lumbar spondylolysis, Malaria (1964), Normal nuclear stress test (04-23-2010), OSA (obstructive sleep apnea), Paraesophageal hernia, Personal history of colonic polyps (08/21/2009), Pneumonia, Postgastric surgery syndromes, Presence of permanent cardiac pacemaker, Prostate cancer ([place]) (2016), Unspecified essential hypertension, Unspecified vitamin D deficiency, Ventral hernia, and Vitamin B12 deficiency.  He presents to the office today for an acute issue of dizziness x1 day.  Reports that he woke up yesterday and felt as though the room was spinning, this continued throughout the day when he would change positions.  He denies falls, nausea, or vomiting.  Does report that he has had some improvement today but continues to feel dizzy when he changes positions.  He denies facial droop, slurred speech, weakness in his extremities, chest pain, or shortness of breath   Review of Systems See HPI   Past Medical History:  Diagnosis Date  . Acute meniscal tear of knee left  . Arthritis    generalized  . Calcaneal spur   . Colon cancer [facility]) 1983   surgical removed. did not receive radiation or chemotherapy.  . Coronary artery disease    dr Stanford Breed  . Cramps of lower extremity    in the mornings occ.  . Depression   . Diverticulosis of colon (without mention of hemorrhage)     . Echocardiogram findings abnormal, without diagnosis 04-23-2010   normal LV function, mod. left atrial enlargement, mild right artrial enlargement and grade 1 diastolic dysfunction  . Edema of lower extremity    ankles, elevates legs  . Elevated prostate specific antigen (PSA)   . Esophageal reflux   . External hemorrhoids   . Frequency of urination    followed by dr dalhstedt  . Gout   . Hiatal hernia   . History of colon cancer 1988  . Hyperlipidemia   . Lumbar spondylolysis   . Malaria 1964  . Normal nuclear stress test 04-23-2010   ef 51%,  septal hypokinesis, normal perfusion  . OSA (obstructive sleep apnea)    does not use CPAP   . Paraesophageal hernia    large  . Personal history of colonic polyps 08/21/2009   TUBULAR ADENOMAS  . Pneumonia    history of; last episode 2015  . Postgastric surgery syndromes   . Presence of permanent cardiac pacemaker   . Prostate cancer ([place]) 2016  . Unspecified essential hypertension   . Unspecified vitamin D deficiency   . Ventral hernia   . Vitamin B12 deficiency     Social History   Socioeconomic History  . Marital status: Married    Spouse name: Not on file  . Number of children: 4  . Years of education: Not on file  . Highest education level: Not on file  Occupational History  . Occupation: retired    Fish farm manager: [employer]  Tobacco Use  . Smoking status: Former Smoker    Packs/day: 2.00    Years: 20.00    Pack years: 40.00  Types: Cigarettes    Quit date: 07/05/1974    Years since quitting: 45.8  . Smokeless tobacco: Never Used  Vaping Use  . Vaping Use: Never used  Substance and Sexual Activity  . Alcohol use: Yes    Alcohol/week: 10.0 standard drinks    Types: 10 Glasses of wine per week    Comment: socially; also 1-2 glasses of wine or beer every other day   . Drug use: No  . Sexual activity: Not Currently  Other Topics Concern  . Not on file  Social History Narrative   Recently widowed   Former  Smoker    Alcohol use-yes 1-2 drinks per day      Occupation: Retired Midwife      Originally from [place], [place] - in [place] > 10 yrs as of 2017      Social Determinants of Health   Financial Resource Strain: [place]   . Difficulty of Paying Living Expenses: Not hard at all  Food Insecurity:   . Worried About Charity fundraiser in the Last Year: Not on file  . Ran Out of Food in the Last Year: Not on file  Transportation Needs: No Transportation Needs  . Lack of Transportation (Medical): No  . Lack of Transportation (Non-Medical): No  Physical Activity:   . Days of Exercise per Week: Not on file  . Minutes of Exercise per Session: Not on file  Stress:   . Feeling of Stress : Not on file  Social Connections:   . Frequency of Communication with Friends and Family: Not on file  . Frequency of Social Gatherings with Friends and Family: Not on file  . Attends Religious Services: Not on file  . Active Member of Clubs or Organizations: Not on file  . Attends Archivist Meetings: Not on file  . Marital Status: Not on file  Intimate Partner Violence:   . Fear of Current or Ex-Partner: Not on file  . Emotionally Abused: Not on file  . Physically Abused: Not on file  . Sexually Abused: Not on file    Past Surgical History:  Procedure Laterality Date  . BRONCHIAL WASHINGS  10/30/2019   Procedure: BRONCHIAL WASHINGS;  Surgeon: Julian Hy, DO;  [facility];  Service: Endoscopy;;  . CARDIAC CATHETERIZATION    . CATARACT EXTRACTION W/ INTRAOCULAR LENS  IMPLANT, BILATERAL Bilateral   . COLONOSCOPY    . ESOPHAGOGASTRODUODENOSCOPY    . KNEE ARTHROSCOPY Right 2007  . KNEE ARTHROSCOPY  07/13/2011   Procedure: ARTHROSCOPY KNEE;  Surgeon: Cynda Familia;  Location: [place];  Service: Orthopedics;  Laterality: Left;  partial menisectomy with chondrylplasty  . KNEE SURGERY Left   . LAMINECTOMY AND MICRODISCECTOMY LUMBAR SPINE  MARCH  2008   L3 -   4  . LAPAROSCOPIC INCISIONAL / UMBILICAL / VENTRAL HERNIA REPAIR  2006  . PACEMAKER IMPLANT N/A 08/02/2018   Procedure: PACEMAKER IMPLANT;  Surgeon: Evans Lance, MD;  Location: [facility] [facility];  Service: Cardiovascular;  Laterality: N/A;  . PROSTATE BIOPSY    . RADIOACTIVE SEED IMPLANT N/A 02/12/2019   Procedure: RADIOACTIVE SEED IMPLANT/BRACHYTHERAPY IMPLANT;  Surgeon: Franchot Gallo, MD;  [facility];  Service: Urology;  Laterality: N/A;  90 MINS  . SHOULDER ARTHROSCOPY Right 05-23-2007  . SIGMOID COLECTOMY FOR CANCER  1989  . SPACE OAR INSTILLATION N/A 02/12/2019   Procedure: SPACE OAR INSTILLATION;  Surgeon: Franchot Gallo, MD;  [facility];  Service: Urology;  Laterality: N/A;  . TOTAL KNEE ARTHROPLASTY Left 05/23/2015   Procedure: LEFT TOTAL KNEE ARTHROPLASTY;  Surgeon: Sydnee Cabal, MD;  [facility];  Service: Orthopedics;  Laterality: Left;  [name] Kitchen VERTICAL BANDED GASTROPLASTY  1986  . VIDEO BRONCHOSCOPY N/A 10/30/2019   Procedure: VIDEO BRONCHOSCOPY WITH BRONICAL ALVEROLAR LAVAGE WITHOUT FLUORO;  Surgeon: Julian Hy, DO;  [facility];  Service: Endoscopy;  Laterality: N/A;    Family History  Problem Relation Age of Onset  . Stroke Paternal Grandfather   . Heart disease Paternal Grandfather   . Esophagitis Father        died from perforated esophagus  . Breast cancer Mother   . Colon cancer Maternal Grandmother        questionable  . Esophageal cancer Neg Hx   . Prostate cancer Neg Hx   . Stomach cancer Neg Hx     Allergies  Allergen Reactions  . Contrast Media [Iodinated Diagnostic Agents] Palpitations    TACHYCARDIA- patient states he has tolerated newer agents since this reaction >30 yrs ago    Current Outpatient Medications on File Prior to Visit  Medication Sig Dispense Refill  . allopurinol (ZYLOPRIM) 300 MG tablet Take 1 tablet (300 mg total) by mouth daily. 90 tablet 1  . amLODipine (NORVASC) 10 MG tablet Take 1 tablet (10  mg total) by mouth daily. 90 tablet 1  . Calcium Carb-Cholecalciferol (CALCIUM 600 + D PO) Take 1 tablet by mouth daily. 800 units of vitamin D    . docusate sodium (COLACE) 100 MG capsule Take 100 mg by mouth daily.     . metoCLOPramide (REGLAN) 10 MG tablet TAKE 1 TABLET (10 MG TOTAL) BY MOUTH AT BEDTIME AS NEEDED FOR NAUSEA. (Patient taking differently: Take 10 mg by mouth in the morning. ) 90 tablet 0  . Multiple Vitamin (MULTIVITAMIN) tablet Take 1 tablet by mouth daily.    [name] Kitchen omeprazole (PRILOSEC) 40 MG capsule Take 1 capsule (40 mg total) by mouth daily. 90 capsule 1  . oxymetazoline (AFRIN) 0.05 % nasal spray Place 1 spray into both nostrils 2 (two) times daily.    . potassium gluconate 595 (99 K) MG TABS tablet Take 595 mg by mouth daily.    [name] Kitchen Respiratory Therapy Supplies (FLUTTER) DEVI 1 each by Does not apply route in the morning and at bedtime. 1 each 0  . simvastatin (ZOCOR) 20 MG tablet Take 1 tablet (20 mg total) by mouth at bedtime. 90 tablet 3  . tamsulosin (FLOMAX) 0.4 MG CAPS capsule Take 0.4 mg by mouth daily.     . valsartan-hydrochlorothiazide (DIOVAN-HCT) 320-25 MG tablet Take 1 tablet by mouth daily. 90 tablet 1  . vitamin B-12 (CYANOCOBALAMIN) 1000 MCG tablet Take 1,000 mcg by mouth daily.      No current facility-administered medications on file prior to visit.    BP 112/62 (BP Location: Left Arm, Patient Position: Sitting, Cuff Size: Large)   Pulse 60   Temp 98.3 F (36.8 C) (Oral)   Ht 5\' 8"  (1.727 m)   Wt 253 lb 12.8 oz (115.1 kg)   SpO2 97%   BMI 38.59 kg/m       Objective:   Physical Exam Vitals and nursing note reviewed.  Constitutional:      Appearance: Normal appearance.  HENT:     Right Ear: Tympanic membrane, ear canal and external ear normal. There is no impacted cerumen.     Left Ear: Tympanic membrane, ear canal  and external ear normal. There is no impacted cerumen.     Nose: Nose normal. No congestion or rhinorrhea.  Eyes:     Extraocular  Movements: Extraocular movements intact.     Right eye: Nystagmus present.     Left eye: Nystagmus present.     Pupils: Pupils are equal, round, and reactive to light.  Neurological:     General: No focal deficit present.     Mental Status: He is oriented to person, place, and time.     Comments: Dizziness with change of positions in the office today that resolved at rest.        Assessment & Plan:  1. Benign paroxysmal positional vertigo due to bilateral vestibular disorder - Advised to change positions slowly. Follow up if symptoms do not resolve in the next few days and at that time will consider Vestibular rehab  - meclizine (ANTIVERT) 25 MG tablet; Take 1 tablet (25 mg total) by mouth 3 (three) times daily as needed for dizziness.  Dispense: 30 tablet; Refill: 0  Dorothyann Peng, NP

## 2020-04-25 ENCOUNTER — Telehealth: Payer: Self-pay | Admitting: Adult Health

## 2020-04-25 NOTE — Telephone Encounter (Signed)
Patient states the diagnosis at the last visit seemed to fix his ailment.  He wants to know if he still needs to go get his CT Scan done on Thursday?

## 2020-04-29 NOTE — Telephone Encounter (Signed)
The CT scan was partially due to memory issues he was telling me about. I would recommend that he still have the CT scan   - [facility]

## 2020-04-29 NOTE — Telephone Encounter (Signed)
Left message on machine for patient to return our call 

## 2020-04-30 ENCOUNTER — Ambulatory Visit: Payer: Medicare Other | Admitting: Cardiology

## 2020-05-01 ENCOUNTER — Other Ambulatory Visit: Payer: Medicare Other

## 2020-05-02 DIAGNOSIS — Z20822 Contact with and (suspected) exposure to covid-19: Secondary | ICD-10-CM | POA: Diagnosis not present

## 2020-05-02 NOTE — Telephone Encounter (Signed)
Left message on machine for patient to return our call 

## 2020-05-05 DIAGNOSIS — J479 Bronchiectasis, uncomplicated: Secondary | ICD-10-CM | POA: Diagnosis not present

## 2020-05-08 NOTE — Telephone Encounter (Signed)
FYI  Spoke with patient and he had to cancel his CT because he was exposed to [place].  He went for testing and it was negative, but he is still feeling fatigued.  Patient will call back when he is feeling better.

## 2020-05-12 ENCOUNTER — Telehealth: Payer: Self-pay | Admitting: Adult Health

## 2020-05-12 NOTE — Telephone Encounter (Signed)
FYI   pt wants to inform Georgina Snell that his CT is scheduled for 05/27/2020 at @10 :20 am

## 2020-05-15 ENCOUNTER — Ambulatory Visit (INDEPENDENT_AMBULATORY_CARE_PROVIDER_SITE_OTHER): Payer: Medicare Other | Admitting: Adult Health

## 2020-05-15 ENCOUNTER — Encounter: Payer: Self-pay | Admitting: Adult Health

## 2020-05-15 ENCOUNTER — Other Ambulatory Visit: Payer: Self-pay

## 2020-05-15 ENCOUNTER — Telehealth: Payer: Self-pay | Admitting: Pulmonary Disease

## 2020-05-15 VITALS — BP 100/58 | HR 69 | Temp 98.2°F | Ht 68.0 in | Wt 256.2 lb

## 2020-05-15 DIAGNOSIS — R058 Other specified cough: Secondary | ICD-10-CM | POA: Diagnosis not present

## 2020-05-15 DIAGNOSIS — G4733 Obstructive sleep apnea (adult) (pediatric): Secondary | ICD-10-CM

## 2020-05-15 DIAGNOSIS — Z9989 Dependence on other enabling machines and devices: Secondary | ICD-10-CM

## 2020-05-15 NOTE — Telephone Encounter (Signed)
Called and spoke with pt letting him know the info stated by CY and he verbalized understanding. Orders have been placed. Nothing further needed.

## 2020-05-15 NOTE — Progress Notes (Signed)
Subjective:    Patient ID: ESA RADEN, male    DOB: 05-10-1941, 79 y.o.   MRN: [id number]  HPI 79 year old male who  has a past medical history of Acute meniscal tear of knee (left), Arthritis, Calcaneal spur, Colon cancer ([place]) (1983), Coronary artery disease, Cramps of lower extremity, Depression, Diverticulosis of colon (without mention of hemorrhage), Echocardiogram findings abnormal, without diagnosis (04-23-2010), Edema of lower extremity, Elevated prostate specific antigen (PSA), Esophageal reflux, External hemorrhoids, Frequency of urination, Gout, Hiatal hernia, History of colon cancer (1988), Hyperlipidemia, Lumbar spondylolysis, Malaria (1964), Normal nuclear stress test (04-23-2010), OSA (obstructive sleep apnea), Paraesophageal hernia, Personal history of colonic polyps (08/21/2009), Pneumonia, Postgastric surgery syndromes, Presence of permanent cardiac pacemaker, Prostate cancer ([place]) (2016), Unspecified essential hypertension, Unspecified vitamin D deficiency, Ventral hernia, and Vitamin B12 deficiency.  Is being evaluated today for a chronic issue of a productive cough with yellow sputum that has been present for "a long time", most likely multiple years.  He has been seen by gastroenterology who thought that it could be from his acid reflux, for which he is on Prilosec 40 mg 4.  He reports that his acid reflux has resolved while being on this medication.  Cough is present throughout the day.  Denies fevers, chills, shortness of breath, or chest pain.  Just recently bought a CPAP cleaner and had his settings adjusted earlier today.  He is wondering if CPAP is the cause of his productive cough   Review of Systems See HPI   Past Medical History:  Diagnosis Date  . Acute meniscal tear of knee left  . Arthritis    generalized  . Calcaneal spur   . Colon cancer [facility]) 1983   surgical removed. did not receive radiation or chemotherapy.  . Coronary artery disease    dr Stanford Breed   . Cramps of lower extremity    in the mornings occ.  . Depression   . Diverticulosis of colon (without mention of hemorrhage)   . Echocardiogram findings abnormal, without diagnosis 04-23-2010   normal LV function, mod. left atrial enlargement, mild right artrial enlargement and grade 1 diastolic dysfunction  . Edema of lower extremity    ankles, elevates legs  . Elevated prostate specific antigen (PSA)   . Esophageal reflux   . External hemorrhoids   . Frequency of urination    followed by dr dalhstedt  . Gout   . Hiatal hernia   . History of colon cancer 1988  . Hyperlipidemia   . Lumbar spondylolysis   . Malaria 1964  . Normal nuclear stress test 04-23-2010   ef 51%,  septal hypokinesis, normal perfusion  . OSA (obstructive sleep apnea)    does not use CPAP   . Paraesophageal hernia    large  . Personal history of colonic polyps 08/21/2009   TUBULAR ADENOMAS  . Pneumonia    history of; last episode 2015  . Postgastric surgery syndromes   . Presence of permanent cardiac pacemaker   . Prostate cancer ([place]) 2016  . Unspecified essential hypertension   . Unspecified vitamin D deficiency   . Ventral hernia   . Vitamin B12 deficiency     Social History   Socioeconomic History  . Marital status: Married    Spouse name: Not on file  . Number of children: 4  . Years of education: Not on file  . Highest education level: Not on file  Occupational History  . Occupation: retired    Fish farm manager:  [employer]  Tobacco Use  . Smoking status: Former Smoker    Packs/day: 2.00    Years: 20.00    Pack years: 40.00    Types: Cigarettes    Quit date: 07/05/1974    Years since quitting: 45.8  . Smokeless tobacco: Never Used  Vaping Use  . Vaping Use: Never used  Substance and Sexual Activity  . Alcohol use: Yes    Alcohol/week: 10.0 standard drinks    Types: 10 Glasses of wine per week    Comment: socially; also 1-2 glasses of wine or beer every other day   . Drug use: No   . Sexual activity: Not Currently  Other Topics Concern  . Not on file  Social History Narrative   Recently widowed   Former Smoker    Alcohol use-yes 1-2 drinks per day      Occupation: Retired Midwife      Originally from [place], [place] - in [place] > 10 yrs as of 2017      Social Determinants of Health   Financial Resource Strain: [place]   . Difficulty of Paying Living Expenses: Not hard at all  Food Insecurity:   . Worried About Charity fundraiser in the Last Year: Not on file  . Ran Out of Food in the Last Year: Not on file  Transportation Needs: No Transportation Needs  . Lack of Transportation (Medical): No  . Lack of Transportation (Non-Medical): No  Physical Activity:   . Days of Exercise per Week: Not on file  . Minutes of Exercise per Session: Not on file  Stress:   . Feeling of Stress : Not on file  Social Connections:   . Frequency of Communication with Friends and Family: Not on file  . Frequency of Social Gatherings with Friends and Family: Not on file  . Attends Religious Services: Not on file  . Active Member of Clubs or Organizations: Not on file  . Attends Archivist Meetings: Not on file  . Marital Status: Not on file  Intimate Partner Violence:   . Fear of Current or Ex-Partner: Not on file  . Emotionally Abused: Not on file  . Physically Abused: Not on file  . Sexually Abused: Not on file    Past Surgical History:  Procedure Laterality Date  . BRONCHIAL WASHINGS  10/30/2019   Procedure: BRONCHIAL WASHINGS;  Surgeon: Julian Hy, DO;  [facility];  Service: Endoscopy;;  . CARDIAC CATHETERIZATION    . CATARACT EXTRACTION W/ INTRAOCULAR LENS  IMPLANT, BILATERAL Bilateral   . COLONOSCOPY    . ESOPHAGOGASTRODUODENOSCOPY    . KNEE ARTHROSCOPY Right 2007  . KNEE ARTHROSCOPY  07/13/2011   Procedure: ARTHROSCOPY KNEE;  Surgeon: Cynda Familia;  Location: [place];  Service: Orthopedics;  Laterality: Left;   partial menisectomy with chondrylplasty  . KNEE SURGERY Left   . LAMINECTOMY AND MICRODISCECTOMY LUMBAR SPINE  MARCH  2008   L3 -  4  . LAPAROSCOPIC INCISIONAL / UMBILICAL / VENTRAL HERNIA REPAIR  2006  . PACEMAKER IMPLANT N/A 08/02/2018   Procedure: PACEMAKER IMPLANT;  Surgeon: Evans Lance, MD;  Location: [facility];  Service: Cardiovascular;  Laterality: N/A;  . PROSTATE BIOPSY    . RADIOACTIVE SEED IMPLANT N/A 02/12/2019   Procedure: RADIOACTIVE SEED IMPLANT/BRACHYTHERAPY IMPLANT;  Surgeon: Franchot Gallo, MD;  [facility];  Service: Urology;  Laterality: N/A;  90 MINS  . SHOULDER ARTHROSCOPY Right 05-23-2007  .  SIGMOID COLECTOMY FOR CANCER  1989  . SPACE OAR INSTILLATION N/A 02/12/2019   Procedure: SPACE OAR INSTILLATION;  Surgeon: Franchot Gallo, MD;  [facility];  Service: Urology;  Laterality: N/A;  . TOTAL KNEE ARTHROPLASTY Left 05/23/2015   Procedure: LEFT TOTAL KNEE ARTHROPLASTY;  Surgeon: Sydnee Cabal, MD;  [facility];  Service: Orthopedics;  Laterality: Left;  [name] Kitchen VERTICAL BANDED GASTROPLASTY  1986  . VIDEO BRONCHOSCOPY N/A 10/30/2019   Procedure: VIDEO BRONCHOSCOPY WITH BRONICAL ALVEROLAR LAVAGE WITHOUT FLUORO;  Surgeon: Julian Hy, DO;  [facility];  Service: Endoscopy;  Laterality: N/A;    Family History  Problem Relation Age of Onset  . Stroke Paternal Grandfather   . Heart disease Paternal Grandfather   . Esophagitis Father        died from perforated esophagus  . Breast cancer Mother   . Colon cancer Maternal Grandmother        questionable  . Esophageal cancer Neg Hx   . Prostate cancer Neg Hx   . Stomach cancer Neg Hx     Allergies  Allergen Reactions  . Contrast Media [Iodinated Diagnostic Agents] Palpitations    TACHYCARDIA- patient states he has tolerated newer agents since this reaction >30 yrs ago    Current Outpatient Medications on File Prior to Visit  Medication Sig Dispense Refill  . allopurinol  (ZYLOPRIM) 300 MG tablet Take 1 tablet (300 mg total) by mouth daily. 90 tablet 1  . amLODipine (NORVASC) 10 MG tablet Take 1 tablet (10 mg total) by mouth daily. 90 tablet 1  . Calcium Carb-Cholecalciferol (CALCIUM 600 + D PO) Take 1 tablet by mouth daily. 800 units of vitamin D    . docusate sodium (COLACE) 100 MG capsule Take 100 mg by mouth daily.     . meclizine (ANTIVERT) 25 MG tablet Take 1 tablet (25 mg total) by mouth 3 (three) times daily as needed for dizziness. 30 tablet 0  . metoCLOPramide (REGLAN) 10 MG tablet TAKE 1 TABLET (10 MG TOTAL) BY MOUTH AT BEDTIME AS NEEDED FOR NAUSEA. (Patient taking differently: Take 10 mg by mouth in the morning. ) 90 tablet 0  . Multiple Vitamin (MULTIVITAMIN) tablet Take 1 tablet by mouth daily.    [name] Kitchen omeprazole (PRILOSEC) 40 MG capsule Take 1 capsule (40 mg total) by mouth daily. 90 capsule 1  . oxymetazoline (AFRIN) 0.05 % nasal spray Place 1 spray into both nostrils 2 (two) times daily.    . potassium gluconate 595 (99 K) MG TABS tablet Take 595 mg by mouth daily.    [name] Kitchen Respiratory Therapy Supplies (FLUTTER) DEVI 1 each by Does not apply route in the morning and at bedtime. 1 each 0  . simvastatin (ZOCOR) 20 MG tablet Take 1 tablet (20 mg total) by mouth at bedtime. 90 tablet 3  . tamsulosin (FLOMAX) 0.4 MG CAPS capsule Take 0.4 mg by mouth daily.     . valsartan-hydrochlorothiazide (DIOVAN-HCT) 320-25 MG tablet Take 1 tablet by mouth daily. 90 tablet 1  . vitamin B-12 (CYANOCOBALAMIN) 1000 MCG tablet Take 1,000 mcg by mouth daily.      No current facility-administered medications on file prior to visit.    BP (!) 100/58 (BP Location: Left Arm, Patient Position: Sitting, Cuff Size: Large)   Pulse 69   Temp 98.2 F (36.8 C) (Oral)   Ht 5\' 8"  (1.727 m)   Wt 256 lb 3.2 oz (116.2 kg)   SpO2 96%   BMI 38.96 kg/m  Objective:   Physical Exam Vitals and nursing note reviewed.  Constitutional:      Appearance: Normal appearance.  HENT:      Nose: Congestion present. No rhinorrhea.     Mouth/Throat:     Mouth: Mucous membranes are moist.     Pharynx: Oropharynx is clear. No oropharyngeal exudate.  Cardiovascular:     Rate and Rhythm: Normal rate and regular rhythm.     Pulses: Normal pulses.     Heart sounds: Normal heart sounds.  Pulmonary:     Effort: Pulmonary effort is normal.     Breath sounds: Normal breath sounds.  Neurological:     General: No focal deficit present.     Mental Status: He is alert.  Psychiatric:        Mood and Affect: Mood normal.        Behavior: Behavior normal.        Thought Content: Thought content normal.        Judgment: Judgment normal.       Assessment & Plan:  1. Productive cough -Vies that he can try with a new CPAP settings.  Unsure if this is the cause of his chronic nonproductive cough.  He will follow-up with pulmonary if it continues.  In the meantime can use Mucinex or other expectorant  Dorothyann Peng, NP

## 2020-05-15 NOTE — Telephone Encounter (Signed)
Orders have been placed and pended in chart to be signed after speaking to patient. lmtcb for pt to make aware of recs.

## 2020-05-15 NOTE — Telephone Encounter (Signed)
Pt called back, states that he has had difficulty using cpap for the past month.  States that the machine blows too much air, has to take off the machine to get some sleep.  States this has been happening approx 1 month.  Printed both a 30 and 60 day download d/t the duration of the problem.   Pt denies any weight gain/loss, new medications in this time frame.  Denies any respiratory symptoms.  DME: [facility]. Sending to DOD since RA is unavailable.  Dr. Annamaria Boots please advise on recommendations for cpap.  Thanks!

## 2020-05-15 NOTE — Telephone Encounter (Signed)
Pt is returning call - [phone]

## 2020-05-15 NOTE — Telephone Encounter (Signed)
LMTCB

## 2020-05-15 NOTE — Telephone Encounter (Signed)
Review of download shows a lot of leak, so the machine may be blowing hard trying to compensate for mask leak. Suggest 1) Order DME please reduce CPAP from 9 to 8                 2) [facility] to [facility] for [facility] fitting/ desensitization  Please have him report back to [place] whether these measures help.

## 2020-05-15 NOTE — Telephone Encounter (Signed)
lmtcb for pt.  

## 2020-05-19 ENCOUNTER — Ambulatory Visit (INDEPENDENT_AMBULATORY_CARE_PROVIDER_SITE_OTHER): Payer: Medicare Other

## 2020-05-19 DIAGNOSIS — I4430 Unspecified atrioventricular block: Secondary | ICD-10-CM

## 2020-05-19 NOTE — Progress Notes (Signed)
HPI: FU chest painand syncope. Patient apparently with long history of atypical chest pain. Previous cardiac catheterization normal in [place] by his report. Echocardiogram in October of 2011 showed normal LV function, moderate left atrial enlargement, mild right atrial enlargement and grade 1 diastolic dysfunction. Nuclear study in April of 2014 showed ejection fraction 59%. There was a small scar in the inferior wall but no ischemia.Abdominal ultrasound January 2018 showed no aneurysm. Echocardiogram January 2020 showed normal LV function and mild diastolic dysfunction. Monitor January 2020 showed sinus rhythm with Mobitz 1, 2-1 AV block, complete heart block, junctional rhythm and 8 beats nonsustained ventricular tachycardia.  Had pacemaker implanted January 2020.  Chest CT March 2021 showed left main and three-vessel atherosclerosis. Since last seen,patient denies dyspnea, chest pain, palpitations or syncope.  Current Outpatient Medications  Medication Sig Dispense Refill   allopurinol (ZYLOPRIM) 300 MG tablet Take 1 tablet (300 mg total) by mouth daily. 90 tablet 1   amLODipine (NORVASC) 10 MG tablet Take 1 tablet (10 mg total) by mouth daily. 90 tablet 1   Calcium Carb-Cholecalciferol (CALCIUM 600 + D PO) Take 1 tablet by mouth daily. 800 units of vitamin D     docusate sodium (COLACE) 100 MG capsule Take 100 mg by mouth daily.      meclizine (ANTIVERT) 25 MG tablet Take 1 tablet (25 mg total) by mouth 3 (three) times daily as needed for dizziness. 30 tablet 0   metoCLOPramide (REGLAN) 10 MG tablet TAKE 1 TABLET (10 MG TOTAL) BY MOUTH AT BEDTIME AS NEEDED FOR NAUSEA. (Patient taking differently: Take 10 mg by mouth in the morning. ) 90 tablet 0   Multiple Vitamin (MULTIVITAMIN) tablet Take 1 tablet by mouth daily.     omeprazole (PRILOSEC) 40 MG capsule Take 1 capsule (40 mg total) by mouth daily. 90 capsule 1   oxymetazoline (AFRIN) 0.05 % nasal spray Place 1 spray into  both nostrils 2 (two) times daily.     potassium gluconate 595 (99 K) MG TABS tablet Take 595 mg by mouth daily.     Respiratory Therapy Supplies (FLUTTER) DEVI 1 each by Does not apply route in the morning and at bedtime. 1 each 0   simvastatin (ZOCOR) 20 MG tablet Take 1 tablet (20 mg total) by mouth at bedtime. 90 tablet 3   tamsulosin (FLOMAX) 0.4 MG CAPS capsule Take 0.4 mg by mouth daily.      valsartan-hydrochlorothiazide (DIOVAN-HCT) 320-25 MG tablet Take 1 tablet by mouth daily. 90 tablet 1   vitamin B-12 (CYANOCOBALAMIN) 1000 MCG tablet Take 1,000 mcg by mouth daily.      No current facility-administered medications for this visit.     Past Medical History:  Diagnosis Date   Acute meniscal tear of knee left   Arthritis    generalized   Calcaneal spur    Colon cancer ([place]) 1983   surgical removed. did not receive radiation or chemotherapy.   Coronary artery disease    dr Stanford Breed   Cramps of lower extremity    in the mornings occ.   Depression    Diverticulosis of colon (without mention of hemorrhage)    Echocardiogram findings abnormal, without diagnosis 04-23-2010   normal LV function, mod. left atrial enlargement, mild right artrial enlargement and grade 1 diastolic dysfunction   Edema of lower extremity    ankles, elevates legs   Elevated prostate specific antigen (PSA)    Esophageal reflux    External hemorrhoids  Frequency of urination    followed by dr dalhstedt   Gout    Hiatal hernia    History of colon cancer 1988   Hyperlipidemia    Lumbar spondylolysis    Malaria 1964   Normal nuclear stress test 04-23-2010   ef 51%,  septal hypokinesis, normal perfusion   OSA (obstructive sleep apnea)    does not use CPAP    Paraesophageal hernia    large   Personal history of colonic polyps 08/21/2009   TUBULAR ADENOMAS   Pneumonia    history of; last episode 2015   Postgastric surgery syndromes    Presence of permanent  cardiac pacemaker    Prostate cancer ([place]) 2016   Unspecified essential hypertension    Unspecified vitamin D deficiency    Ventral hernia    Vitamin B12 deficiency     Past Surgical History:  Procedure Laterality Date   BRONCHIAL WASHINGS  10/30/2019   Procedure: BRONCHIAL WASHINGS;  Surgeon: Julian Hy, DO;  Location: [facility];  Service: Endoscopy;;   CARDIAC CATHETERIZATION     CATARACT EXTRACTION W/ INTRAOCULAR LENS  IMPLANT, BILATERAL Bilateral    COLONOSCOPY     ESOPHAGOGASTRODUODENOSCOPY     KNEE ARTHROSCOPY Right 2007   KNEE ARTHROSCOPY  07/13/2011   Procedure: ARTHROSCOPY KNEE;  Surgeon: Cynda Familia;  Location: [facility];  Service: Orthopedics;  Laterality: Left;  partial menisectomy with chondrylplasty   KNEE SURGERY Left    LAMINECTOMY AND MICRODISCECTOMY LUMBAR SPINE  MARCH  2008   L3 -  4   LAPAROSCOPIC INCISIONAL / UMBILICAL / VENTRAL HERNIA REPAIR  2006   PACEMAKER IMPLANT N/A 08/02/2018   Procedure: PACEMAKER IMPLANT;  Surgeon: Evans Lance, MD;  Location: [facility];  Service: Cardiovascular;  Laterality: N/A;   PROSTATE BIOPSY     RADIOACTIVE SEED IMPLANT N/A 02/12/2019   Procedure: RADIOACTIVE SEED IMPLANT/BRACHYTHERAPY IMPLANT;  Surgeon: Franchot Gallo, MD;  Location: [facility];  Service: Urology;  Laterality: N/A;  90 MINS   SHOULDER ARTHROSCOPY Right 05-23-2007   SIGMOID COLECTOMY FOR CANCER  1989   SPACE OAR INSTILLATION N/A 02/12/2019   Procedure: SPACE OAR INSTILLATION;  Surgeon: Franchot Gallo, MD;  Location: [facility];  Service: Urology;  Laterality: N/A;   TOTAL KNEE ARTHROPLASTY Left 05/23/2015   Procedure: LEFT TOTAL KNEE ARTHROPLASTY;  Surgeon: Sydnee Cabal, MD;  Location: [facility];  Service: Orthopedics;  Laterality: Left;   VERTICAL BANDED GASTROPLASTY  1986   VIDEO BRONCHOSCOPY N/A 10/30/2019   Procedure: VIDEO BRONCHOSCOPY WITH BRONICAL ALVEROLAR LAVAGE WITHOUT FLUORO;  Surgeon:  Julian Hy, DO;  Location: [facility];  Service: Endoscopy;  Laterality: N/A;    Social History   Socioeconomic History   Marital status: Married    Spouse name: Not on file   Number of children: 4   Years of education: Not on file   Highest education level: Not on file  Occupational History   Occupation: retired    Fish farm manager: [employer]  Tobacco Use   Smoking status: Former Smoker    Packs/day: 2.00    Years: 20.00    Pack years: 40.00    Types: Cigarettes    Quit date: 07/05/1974    Years since quitting: 45.9   Smokeless tobacco: Never Used  Vaping Use   Vaping Use: Never used  Substance and Sexual Activity   Alcohol use: Yes    Alcohol/week: 10.0 standard drinks    Types: 10 Glasses of  wine per week    Comment: socially; also 1-2 glasses of wine or beer every other day    Drug use: No   Sexual activity: Not Currently  Other Topics Concern   Not on file  Social History Narrative   Recently widowed   Former Smoker    Alcohol use-yes 1-2 drinks per day      Occupation: Retired Midwife      Originally from [place], [place] - in [place] > 10 yrs as of 2017      Social Determinants of Health   Financial Resource Strain: Low Risk    Difficulty of Paying Living Expenses: Not hard at [employer] Insecurity:    Worried About Charity fundraiser in the Last Year: Not on file   [employer] of Food in the Last Year: Not on file  Transportation Needs: No Transportation Needs   Lack of Transportation (Medical): No   Lack of Transportation (Non-Medical): No  Physical Activity:    Days of Exercise per Week: Not on file   Minutes of Exercise per Session: Not on file  Stress:    Feeling of Stress : Not on file  Social Connections:    Frequency of Communication with Friends and Family: Not on file   Frequency of Social Gatherings with Friends and Family: Not on file   Attends Religious Services: Not on file   Active Member of Clubs or Organizations:  Not on file   Attends Archivist Meetings: Not on file   Marital Status: Not on file  Intimate Partner Violence:    Fear of Current or Ex-Partner: Not on file   Emotionally Abused: Not on file   Physically Abused: Not on file   Sexually Abused: Not on file    Family History  Problem Relation Age of Onset   Stroke Paternal Grandfather    Heart disease Paternal Grandfather    Esophagitis Father        died from perforated esophagus   Breast cancer Mother    Colon cancer Maternal Grandmother        questionable   Esophageal cancer Neg Hx    Prostate cancer Neg Hx    Stomach cancer Neg Hx     ROS: no fevers or chills, productive cough, hemoptysis, dysphasia, odynophagia, melena, hematochezia, dysuria, hematuria, rash, seizure activity, orthopnea, PND, pedal edema, claudication. Remaining systems are negative.  Physical Exam: Well-developed well-nourished in no acute distress.  Skin is warm and dry.  HEENT is normal.  Neck is supple.  Chest is clear to auscultation with normal expansion.  Cardiovascular exam is regular rate and rhythm.  Abdominal exam nontender or distended. No masses palpated. Extremities show no edema. neuro grossly intact  A/P  1 coronary artery disease-based on previous CT demonstrating coronary calcification.  Add aspirin 81 mg daily.  Discontinue Zocor and instead treat with Crestor 20 mg daily.  Check lipids and liver in 12 weeks.  2 hypertension-blood pressure controlled.  Continue present medications and follow.  3 hyperlipidemia-given documented coronary disease we will change from Zocor to Crestor as outlined.  Check lipids and liver in 12 weeks.  4 prior pacemaker-followed by Dr. Lovena Le.  Kirk Ruths, MD

## 2020-05-20 LAB — CUP PACEART REMOTE DEVICE CHECK
Date Time Interrogation Session: 20211115174926
Implantable Lead Implant Date: 20200129
Implantable Lead Implant Date: 20200129
Implantable Lead Location: 753859
Implantable Lead Location: 753860
Implantable Lead Model: 377
Implantable Lead Model: 377
Implantable Lead Serial Number: 80947537
Implantable Lead Serial Number: 80970966
Implantable Pulse Generator Implant Date: 20200129
Pulse Gen Model: 407145
Pulse Gen Serial Number: 69503797

## 2020-05-20 NOTE — Progress Notes (Signed)
Remote pacemaker transmission.   

## 2020-05-22 ENCOUNTER — Ambulatory Visit: Payer: Medicare Other | Admitting: Cardiology

## 2020-05-22 ENCOUNTER — Encounter: Payer: Self-pay | Admitting: Cardiology

## 2020-05-22 VITALS — BP 124/66 | HR 72 | Ht 68.5 in | Wt 257.0 lb

## 2020-05-22 DIAGNOSIS — E7800 Pure hypercholesterolemia, unspecified: Secondary | ICD-10-CM

## 2020-05-22 DIAGNOSIS — I10 Essential (primary) hypertension: Secondary | ICD-10-CM

## 2020-05-22 DIAGNOSIS — I2510 Atherosclerotic heart disease of native coronary artery without angina pectoris: Secondary | ICD-10-CM | POA: Diagnosis not present

## 2020-05-22 MED ORDER — ROSUVASTATIN CALCIUM 20 MG PO TABS: 20.0000 mg | ORAL_TABLET | Freq: Every day | ORAL | 3 refills | Status: DC

## 2020-05-22 MED ORDER — ASPIRIN EC 81 MG PO TBEC: 81.0000 mg | DELAYED_RELEASE_TABLET | Freq: Every day | ORAL | 3 refills | Status: DC

## 2020-05-22 NOTE — Patient Instructions (Signed)
Medication Instructions:   STOP SIMVASTATIN  START ROSUVASTATIN 20 MG ONCE DAILY  START ASPIRIN 81 MG ONCE DAILY  *If you need a refill on your cardiac medications before your next appointment, please call your pharmacy*   Lab Work:  Your physician recommends that you return for lab work in: [place]  If you have labs (blood work) drawn today and your tests are completely normal, you will receive your results only by: [name] Kitchen MyChart Message (if you have MyChart) OR . A paper copy in the mail If you have any lab test that is abnormal or we need to change your treatment, we will call you to review the results.   Follow-Up: At [facility], you and your health needs are our priority.  As part of our continuing mission to provide you with exceptional heart care, we have created designated Provider Care Teams.  These Care Teams include your primary Cardiologist (physician) and Advanced Practice Providers (APPs -  Physician Assistants and Nurse Practitioners) who all work together to provide you with the care you need, when you need it.  We recommend signing up for the patient portal called "MyChart".  Sign up information is provided on this After Visit Summary.  MyChart is used to connect with patients for Virtual Visits (Telemedicine).  Patients are able to view lab/test results, encounter notes, upcoming appointments, etc.  Non-urgent messages can be sent to your provider as well.   To learn more about what you can do with MyChart, go to [email or web address].    Your next appointment:   12 month(s)  The format for your next appointment:   In Person  Provider:   Kirk Ruths, MD

## 2020-05-27 ENCOUNTER — Other Ambulatory Visit: Payer: Self-pay

## 2020-05-27 ENCOUNTER — Ambulatory Visit
Admission: RE | Admit: 2020-05-27 | Discharge: 2020-05-27 | Disposition: A | Discharge status: Home / Self Care | Payer: Medicare Other | Source: Ambulatory Visit | Attending: Adult Health | Admitting: Adult Health

## 2020-05-27 ENCOUNTER — Ambulatory Visit (HOSPITAL_BASED_OUTPATIENT_CLINIC_OR_DEPARTMENT_OTHER): Payer: Medicare Other | Attending: Internal Medicine | Admitting: Internal Medicine

## 2020-05-27 DIAGNOSIS — G4733 Obstructive sleep apnea (adult) (pediatric): Secondary | ICD-10-CM

## 2020-05-27 DIAGNOSIS — R413 Other amnesia: Secondary | ICD-10-CM

## 2020-05-27 DIAGNOSIS — Z9989 Dependence on other enabling machines and devices: Secondary | ICD-10-CM

## 2020-06-02 ENCOUNTER — Telehealth: Payer: Self-pay | Admitting: Critical Care Medicine

## 2020-06-02 NOTE — Telephone Encounter (Signed)
Called spoke with patient.  He states he is having worsening productive cough waking him up at night. Patient states he is not taking anything OTC to help. He takes his prilosec in the am not 30-60 minutes before eating. Patient denies fevers and body aches. Cough worsening over the last 2 weeks or so . Patient would like an appointment, protocol is to send to provider with any symptoms to determine if appropriate for in office visit , televisit, or any other recommendations.   Patient is vaccinated but no booster at this time.   Dr. Carlis Abbott please advise.

## 2020-06-02 NOTE — Telephone Encounter (Signed)
Sorry to hear he isn't doing well. Why don't we ask him to get a covid test to be sure and have him come in for an acute visit. Can be with anyone since he needs to establish care with another provider in the practice (30 min visit). Needs a CXR when he comes for his visit. If he is running fevers he needs to be seen within 24 hours, either by [place] or PCP or urgent care. Thanks!  Julian Hy, DO 06/02/20 1:22 PM [facility]

## 2020-06-02 NOTE — Telephone Encounter (Signed)
Called and spoke with pt letting him know the info stated by Dr. Carlis Abbott and he verbalized understanding. Pt will call us with covid test results and stated to him if negative we could get him in for an appt. Nothing further needed.

## 2020-06-03 ENCOUNTER — Ambulatory Visit: Payer: Medicare Other

## 2020-06-04 DIAGNOSIS — J479 Bronchiectasis, uncomplicated: Secondary | ICD-10-CM | POA: Diagnosis not present

## 2020-06-05 DIAGNOSIS — Z20822 Contact with and (suspected) exposure to covid-19: Secondary | ICD-10-CM | POA: Diagnosis not present

## 2020-06-23 ENCOUNTER — Ambulatory Visit (INDEPENDENT_AMBULATORY_CARE_PROVIDER_SITE_OTHER): Payer: Medicare Other

## 2020-06-23 ENCOUNTER — Encounter: Payer: Self-pay | Admitting: Adult Health

## 2020-06-23 ENCOUNTER — Ambulatory Visit: Payer: Medicare Other | Admitting: Adult Health

## 2020-06-23 ENCOUNTER — Other Ambulatory Visit: Payer: Self-pay

## 2020-06-23 VITALS — BP 120/60 | HR 65 | Temp 97.5°F | Ht 69.0 in | Wt 265.2 lb

## 2020-06-23 DIAGNOSIS — R059 Cough, unspecified: Secondary | ICD-10-CM | POA: Diagnosis not present

## 2020-06-23 DIAGNOSIS — J471 Bronchiectasis with (acute) exacerbation: Secondary | ICD-10-CM

## 2020-06-23 DIAGNOSIS — G4733 Obstructive sleep apnea (adult) (pediatric): Secondary | ICD-10-CM | POA: Diagnosis not present

## 2020-06-23 DIAGNOSIS — J479 Bronchiectasis, uncomplicated: Secondary | ICD-10-CM

## 2020-06-23 DIAGNOSIS — K219 Gastro-esophageal reflux disease without esophagitis: Secondary | ICD-10-CM

## 2020-06-23 DIAGNOSIS — J90 Pleural effusion, not elsewhere classified: Secondary | ICD-10-CM | POA: Diagnosis not present

## 2020-06-23 HISTORY — DX: Bronchiectasis with (acute) exacerbation: J47.1

## 2020-06-23 MED ORDER — AZITHROMYCIN 250 MG PO TABS: ORAL_TABLET | ORAL | 0 refills | Status: AC

## 2020-06-23 NOTE — Assessment & Plan Note (Signed)
Positive GERD symptoms despite PPI daily.  Patient is advised on GERD diet.  And to add in Pepcid at bedtime.  Plan  Patient Instructions  Begin Saline nasal spray Twice daily  (over the counter )  Begin Pepcid 20mg  At bedtime  (over the counter med)  Continue on Omeprazole daily in am  GERD diet  Zpack take as directed  Mucinex Liquid Twice daily  As needed  (over the counter)  Begin Flutter valve Three times a day  .  Continue on CPAP At bedtime  .  Work on healthy weight loss.  Follow up with Dr. Elsworth Soho  Or Chaquita Basques NP in 6 weeks with PFT and As needed   Please contact office for sooner follow up if symptoms do not improve or worsen or seek emergency care

## 2020-06-23 NOTE — Assessment & Plan Note (Signed)
[facility] -  We will increase pulmonary hygiene regimen with flutter valve and Mucinex. Patient education on bronchiectasis and pulmonary hygiene mechanisms. Also control for triggers such as postnasal drainage.  And GERD. Check PFTs on return Plan  Patient Instructions  Begin Saline nasal spray Twice daily  (over the counter )  Begin Pepcid 20mg  At bedtime  (over the counter med)  Continue on Omeprazole daily in am  GERD diet  Zpack take as directed  Mucinex Liquid Twice daily  As needed  (over the counter)  Begin Flutter valve Three times a day  .  Continue on CPAP At bedtime  .  Work on healthy weight loss.  Follow up with Dr. Elsworth Soho  Or Brendalyn Vallely NP in 6 weeks with PFT and As needed   Please contact office for sooner follow up if symptoms do not improve or worsen or seek emergency care

## 2020-06-23 NOTE — Patient Instructions (Signed)
Begin Saline nasal spray Twice daily  (over the counter )  Begin Pepcid 20mg  At bedtime  (over the counter med)  Continue on Omeprazole daily in am  GERD diet  Zpack take as directed  Mucinex Liquid Twice daily  As needed  (over the counter)  Begin Flutter valve Three times a day  .  Continue on CPAP At bedtime  .  Work on healthy weight loss.  Follow up with Dr. Elsworth Soho  Or Terilynn Buresh NP in 6 weeks with PFT and As needed   Please contact office for sooner follow up if symptoms do not improve or worsen or seek emergency care

## 2020-06-23 NOTE — Progress Notes (Signed)
@Patient  ID: Marcus [name], male    DOB: May 16, 1941, 79 y.o.   MRN: [id number]  Chief Complaint  Patient presents with   Follow-up   Cough    Referring provider: Dorothyann Peng, NP  HPI: 79 year old male former smoker  followed for Chronic cough and Mild Bronchiectasis  Medical history significant for obesity status post bariatric surgery in 1980s, obstructive sleep apnea on CPAP  TEST/EVENTS :  CXR, 2 view 08/21/2019-elevated left hemidiaphragm, pacemaker in place.  Mild kyphosis with flattened hemidiaphragms most notable posteriorly.  CT chest 01/17/2015-lung parenchyma, no significant mediastinal adenopathy.  Senescent vascular calcification.  HRCT chest 09/27/19- Scattered groundglass opacities and tree-in-bud opacities.  Bilateral basilar linear scars.  Mild bronchiectasis bilateral lower lobes, right middle lobe, right upper lobe, lingula.  Minimal air-trapping.  No significant mediastinal or hilar adenopathy.  Likely small hiatal hernia.  Aortic valve, three-vessel coronary, and aortic calcifications.  Echocardiogram 07/24/18: LVEF 55 to 07%, grade 1 diastolic dysfunction, mildly dilated ascending aorta.  Leg, RV, RA.  Trivial  and PR, otherwise normal valves.MR   06/23/2020 Follow up : Cough  Patient returns for a 42-month follow-up.  Patient has underlying chronic cough felt to be secondary to GERD and possible mild bronchiectasis. Since last visit patient says he is feeling about the same.  He keeps a chronic daily cough.  Patient says he does notice over the last few weeks it has been more productive and has had more green mucus.  Chest x-ray today shows no acute process.  Patient denies minimal shortness of breath.  Does have minimal postnasal drainage.  Patient has significant reflux despite taking Prilosec once daily.  Has no dysphagia.  Denies any weight loss.  Has had weight gain.  Says has not been as active lately.  Recent Covid test was negative.  No recent  antibiotics.  Patient was given a flutter valve several months ago.  But says he does not remember having this.  He denies any hemoptysis, chest pain, orthopnea, increased lower extremity edema. Does have some mild memory impairment.  Covid vaccines x2 are up-to-date.   Retired Midwife . Former smoker -quit age 28.   Patient has obstructive sleep apnea is on nocturnal CPAP.  Patient says he has been wearing his CPAP most every night.  He did have recent travel where he did not take his machine with him.  CPAP download shows 80% compliance.  Daily average usage at 5.5 hours.  AHI 3.4.  Patient says he does feel that the CPAP benefits him with decreased daytime sleepiness.  Allergies  Allergen Reactions   Contrast Media [Iodinated Diagnostic Agents] Palpitations    TACHYCARDIA- patient states he has tolerated newer agents since this reaction >30 yrs ago    Immunization History  Administered Date(s) Administered   Influenza Whole 06/05/2007, 04/30/2008, 03/31/2009   Influenza, High Dose Seasonal PF 06/19/2014, 07/30/2015, 03/03/2017   Influenza,inj,quad, With Preservative 04/04/2017   PFIZER SARS-COV-2 Vaccination 08/13/2019, 09/11/2019   Pneumococcal Conjugate-13 06/19/2014, 07/30/2015   Pneumococcal Polysaccharide-23 12/11/2003, 03/03/2017   Td 03/31/2009   Tdap 10/19/2019    Past Medical History:  Diagnosis Date   Acute meniscal tear of knee left   Arthritis    generalized   Calcaneal spur    Colon cancer ([place]) 1983   surgical removed. did not receive radiation or chemotherapy.   Coronary artery disease    dr Stanford Breed   Cramps of lower extremity    in the mornings occ.   Depression  Diverticulosis of colon (without mention of hemorrhage)    Echocardiogram findings abnormal, without diagnosis 04-23-2010   normal LV function, mod. left atrial enlargement, mild right artrial enlargement and grade 1 diastolic dysfunction   Edema of lower extremity     ankles, elevates legs   Elevated prostate specific antigen (PSA)    Esophageal reflux    External hemorrhoids    Frequency of urination    followed by dr dalhstedt   Gout    Hiatal hernia    History of colon cancer 1988   Hyperlipidemia    Lumbar spondylolysis    Malaria 1964   Normal nuclear stress test 04-23-2010   ef 51%,  septal hypokinesis, normal perfusion   OSA (obstructive sleep apnea)    does not use CPAP    Paraesophageal hernia    large   Personal history of colonic polyps 08/21/2009   TUBULAR ADENOMAS   Pneumonia    history of; last episode 2015   Postgastric surgery syndromes    Presence of permanent cardiac pacemaker    Prostate cancer ([place]) 2016   Unspecified essential hypertension    Unspecified vitamin D deficiency    Ventral hernia    Vitamin B12 deficiency     Tobacco History: Social History   Tobacco Use  Smoking Status Former Smoker   Packs/day: 2.00   Years: 20.00   Pack years: 40.00   Types: Cigarettes   Quit date: 07/05/1974   Years since quitting: 46.0  Smokeless Tobacco Never Used   Counseling given: Not Answered   Outpatient Medications Prior to Visit  Medication Sig Dispense Refill   allopurinol (ZYLOPRIM) 300 MG tablet Take 1 tablet (300 mg total) by mouth daily. 90 tablet 1   amLODipine (NORVASC) 10 MG tablet Take 1 tablet (10 mg total) by mouth daily. 90 tablet 1   aspirin EC 81 MG tablet Take 1 tablet (81 mg total) by mouth daily. Swallow whole. 90 tablet 3   Calcium Carb-Cholecalciferol (CALCIUM 600 + D PO) Take 1 tablet by mouth daily. 800 units of vitamin D     docusate sodium (COLACE) 100 MG capsule Take 100 mg by mouth daily.      meclizine (ANTIVERT) 25 MG tablet Take 1 tablet (25 mg total) by mouth 3 (three) times daily as needed for dizziness. 30 tablet 0   metoCLOPramide (REGLAN) 10 MG tablet TAKE 1 TABLET (10 MG TOTAL) BY MOUTH AT BEDTIME AS NEEDED FOR NAUSEA. (Patient taking differently:  Take 10 mg by mouth in the morning.) 90 tablet 0   Multiple Vitamin (MULTIVITAMIN) tablet Take 1 tablet by mouth daily.     omeprazole (PRILOSEC) 40 MG capsule Take 1 capsule (40 mg total) by mouth daily. 90 capsule 1   oxymetazoline (AFRIN) 0.05 % nasal spray Place 1 spray into both nostrils 2 (two) times daily.     potassium gluconate 595 (99 K) MG TABS tablet Take 595 mg by mouth daily.     rosuvastatin (CRESTOR) 20 MG tablet Take 1 tablet (20 mg total) by mouth daily. 90 tablet 3   tamsulosin (FLOMAX) 0.4 MG CAPS capsule Take 0.4 mg by mouth daily.      valsartan-hydrochlorothiazide (DIOVAN-HCT) 320-25 MG tablet Take 1 tablet by mouth daily. 90 tablet 1   vitamin B-12 (CYANOCOBALAMIN) 1000 MCG tablet Take 1,000 mcg by mouth daily.      Respiratory Therapy Supplies (FLUTTER) DEVI 1 each by Does not apply route in the morning and at bedtime. (Patient not  taking: Reported on 06/23/2020) 1 each 0   No facility-administered medications prior to visit.     Review of Systems:   Constitutional:   No  weight loss, night sweats,  Fevers, chills, fatigue, or  lassitude.  HEENT:   No headaches,  Difficulty swallowing,  Tooth/dental problems, or  Sore throat,                No sneezing, itching, ear ache,  +nasal congestion, post nasal drip,   CV:  No chest pain,  Orthopnea, PND, swelling in lower extremities, anasarca, dizziness, palpitations, syncope.   GI  No heartburn, indigestion, abdominal pain, nausea, vomiting, diarrhea, change in bowel habits, loss of appetite, bloody stools.   Resp:   No chest wall deformity  Skin: no rash or lesions.  GU: no dysuria, change in color of urine, no urgency or frequency.  No flank pain, no hematuria   MS:  No joint pain or swelling.  No decreased range of motion.  No back pain.    Physical Exam  BP 120/60 (BP Location: Left Arm, Patient Position: Sitting, Cuff Size: Large)    Pulse 65    Temp (!) 97.5 F (36.4 C) (Temporal)    Ht 5\' 9"   (1.753 m)    Wt 265 lb 3.2 oz (120.3 kg)    SpO2 98%    BMI 39.16 kg/m   GEN: A/Ox3; pleasant , NAD, well nourished    HEENT:  [place]/AT,   NOSE-clear, THROAT-clear, no lesions, no postnasal drip or exudate noted.   NECK:  Supple w/ fair ROM; no JVD; normal carotid impulses w/o bruits; no thyromegaly or nodules palpated; no lymphadenopathy.    RESP few scattered rhonchi.   no accessory muscle use, no dullness to percussion  CARD:  RRR, no m/r/g, tr peripheral edema, pulses intact, no cyanosis or clubbing.  GI:   Soft & nt; nml bowel sounds; no organomegaly or masses detected.   Musco: Warm bil, no deformities or joint swelling noted.   Neuro: alert, no focal deficits noted.    Skin: Warm, no lesions or rashes    Lab Results: ProBNP Imaging: DG Chest 2 View  Result Date: 06/23/2020 CLINICAL DATA:  Cough. EXAM: CHEST - 2 VIEW COMPARISON:  August 21, 2019. FINDINGS: The heart size and mediastinal contours are within normal limits. Similar position of a left subclavian approach dual lead cardiac rhythm maintenance device. Similar mild streaky bibasilar opacities, likely atelectasis/scar. No consolidation. No visible pleural effusions or pneumothorax. Similar small diaphragmatic eventrations. No evidence of acute osseous abnormality. IMPRESSION: No evidence of acute cardiopulmonary disease.  Stable exam. Electronically Signed   By: Margaretha Sheffield MD   On: 06/23/2020 11:05   CT Head Wo Contrast  Result Date: 05/27/2020 CLINICAL DATA:  Mental status change. EXAM: CT HEAD WITHOUT CONTRAST TECHNIQUE: Contiguous axial images were obtained from the base of the skull through the vertex without intravenous contrast. COMPARISON:  None. FINDINGS: Brain: No evidence of acute large vascular territory infarction, hemorrhage, hydrocephalus, extra-axial collection or mass lesion/mass effect. Mild, age-appropriate volume loss. Vascular: Calcific atherosclerosis. Skull: No acute fracture.  Sinuses/Orbits: Inferior left maxillary sinus retention cyst. Otherwise, sinuses are clear. Unremarkable orbits. Other: No mastoid effusions. IMPRESSION: No evidence of acute intracranial abnormality. Electronically Signed   By: Margaretha Sheffield MD   On: 05/27/2020 15:38      No flowsheet data found.  No results found for: NITRICOXIDE      Assessment & Plan:   Bronchiectasis with  acute exacerbation (HCC) Flare -  We will increase pulmonary hygiene regimen with flutter valve and Mucinex. Patient education on bronchiectasis and pulmonary hygiene mechanisms. Also control for triggers such as postnasal drainage.  And GERD. Check PFTs on return Plan  Patient Instructions  Begin Saline nasal spray Twice daily  (over the counter )  Begin Pepcid 20mg  At bedtime  (over the counter med)  Continue on Omeprazole daily in am  GERD diet  Zpack take as directed  Mucinex Liquid Twice daily  As needed  (over the counter)  Begin Flutter valve Three times a day  .  Continue on CPAP At bedtime  .  Work on healthy weight loss.  Follow up with Dr. Elsworth Soho  Or Kahliya Fraleigh NP in 6 weeks with PFT and As needed   Please contact office for sooner follow up if symptoms do not improve or worsen or seek emergency care        SLEEP APNEA, OBSTRUCTIVE Continue on nocturnal CPAP.  GERD Positive GERD symptoms despite PPI daily.  Patient is advised on GERD diet.  And to add in Pepcid at bedtime.  Plan  Patient Instructions  Begin Saline nasal spray Twice daily  (over the counter )  Begin Pepcid 20mg  At bedtime  (over the counter med)  Continue on Omeprazole daily in am  GERD diet  Zpack take as directed  Mucinex Liquid Twice daily  As needed  (over the counter)  Begin Flutter valve Three times a day  .  Continue on CPAP At bedtime  .  Work on healthy weight loss.  Follow up with Dr. Elsworth Soho  Or Barre Aydelott NP in 6 weeks with PFT and As needed   Please contact office for sooner follow up if symptoms do not  improve or worsen or seek emergency care           Rexene Edison, NP 06/23/2020

## 2020-06-23 NOTE — Assessment & Plan Note (Signed)
Continue on nocturnal CPAP. 

## 2020-07-03 ENCOUNTER — Telehealth: Payer: Self-pay | Admitting: Adult Health

## 2020-07-03 MED ORDER — ALLOPURINOL 300 MG PO TABS: 300.0000 mg | ORAL_TABLET | Freq: Every day | ORAL | 1 refills | Status: DC

## 2020-07-03 NOTE — Telephone Encounter (Signed)
Patient is calling and requesting a refill for allopurinol (ZYLOPRIM) 300 MG tablet sent to [id number] - [place], [place] - [address] [place] [address]  Phone:  [phone] Fax:  [phone] CB is [phone]

## 2020-07-03 NOTE — Telephone Encounter (Signed)
Rx done. 

## 2020-07-05 DIAGNOSIS — J479 Bronchiectasis, uncomplicated: Secondary | ICD-10-CM | POA: Diagnosis not present

## 2020-07-23 DIAGNOSIS — G4733 Obstructive sleep apnea (adult) (pediatric): Secondary | ICD-10-CM | POA: Diagnosis not present

## 2020-07-29 ENCOUNTER — Other Ambulatory Visit: Payer: Self-pay | Admitting: Family Medicine

## 2020-07-29 DIAGNOSIS — I10 Essential (primary) hypertension: Secondary | ICD-10-CM

## 2020-07-29 MED ORDER — VALSARTAN-HYDROCHLOROTHIAZIDE 320-25 MG PO TABS: ORAL_TABLET | ORAL | 0 refills | Status: DC

## 2020-07-29 NOTE — Telephone Encounter (Signed)
Ok to refill for 30 days. Needs to schedule CPE

## 2020-07-29 NOTE — Telephone Encounter (Signed)
Sent to the pharmacy by e-scribe as instructed. 

## 2020-08-01 ENCOUNTER — Telehealth: Payer: Self-pay | Admitting: Adult Health

## 2020-08-01 MED ORDER — AMLODIPINE BESYLATE 10 MG PO TABS: 10.0000 mg | ORAL_TABLET | Freq: Every day | ORAL | 0 refills | Status: DC

## 2020-08-01 NOTE — Telephone Encounter (Signed)
Pt is calling in to get a refill on Rx amlodipine (NORVASC) 10 MG   Pharm:  [facility] [address]

## 2020-08-02 ENCOUNTER — Emergency Department (HOSPITAL_COMMUNITY)
Admission: EM | Admit: 2020-08-02 | Discharge: 2020-08-02 | Disposition: A | Discharge status: Home / Self Care | Payer: No Typology Code available for payment source | Attending: Emergency Medicine | Admitting: Emergency Medicine

## 2020-08-02 ENCOUNTER — Other Ambulatory Visit: Payer: Self-pay

## 2020-08-02 ENCOUNTER — Emergency Department (HOSPITAL_COMMUNITY): Payer: No Typology Code available for payment source

## 2020-08-02 DIAGNOSIS — I10 Essential (primary) hypertension: Secondary | ICD-10-CM | POA: Insufficient documentation

## 2020-08-02 DIAGNOSIS — Z96652 Presence of left artificial knee joint: Secondary | ICD-10-CM | POA: Diagnosis not present

## 2020-08-02 DIAGNOSIS — Z743 Need for continuous supervision: Secondary | ICD-10-CM | POA: Diagnosis not present

## 2020-08-02 DIAGNOSIS — Z87891 Personal history of nicotine dependence: Secondary | ICD-10-CM | POA: Insufficient documentation

## 2020-08-02 DIAGNOSIS — Z950 Presence of cardiac pacemaker: Secondary | ICD-10-CM | POA: Diagnosis not present

## 2020-08-02 DIAGNOSIS — R109 Unspecified abdominal pain: Secondary | ICD-10-CM | POA: Diagnosis not present

## 2020-08-02 DIAGNOSIS — Z7982 Long term (current) use of aspirin: Secondary | ICD-10-CM | POA: Insufficient documentation

## 2020-08-02 DIAGNOSIS — Z79899 Other long term (current) drug therapy: Secondary | ICD-10-CM | POA: Diagnosis not present

## 2020-08-02 DIAGNOSIS — Z85038 Personal history of other malignant neoplasm of large intestine: Secondary | ICD-10-CM | POA: Insufficient documentation

## 2020-08-02 DIAGNOSIS — I2510 Atherosclerotic heart disease of native coronary artery without angina pectoris: Secondary | ICD-10-CM | POA: Diagnosis not present

## 2020-08-02 DIAGNOSIS — R52 Pain, unspecified: Secondary | ICD-10-CM | POA: Diagnosis not present

## 2020-08-02 DIAGNOSIS — M545 Low back pain, unspecified: Secondary | ICD-10-CM | POA: Insufficient documentation

## 2020-08-02 DIAGNOSIS — R6889 Other general symptoms and signs: Secondary | ICD-10-CM | POA: Diagnosis not present

## 2020-08-02 DIAGNOSIS — M549 Dorsalgia, unspecified: Secondary | ICD-10-CM | POA: Diagnosis not present

## 2020-08-02 LAB — URINALYSIS, ROUTINE W REFLEX MICROSCOPIC
Bilirubin Urine: NEGATIVE
Glucose, UA: NEGATIVE mg/dL
Hgb urine dipstick: NEGATIVE
Ketones, ur: NEGATIVE mg/dL
Leukocytes,Ua: NEGATIVE
Nitrite: NEGATIVE
Protein, ur: NEGATIVE mg/dL
Specific Gravity, Urine: 1.019 (ref 1.005–1.030)
pH: 5 (ref 5.0–8.0)

## 2020-08-02 MED ORDER — TIZANIDINE HCL 2 MG PO TABS: 2.0000 mg | ORAL_TABLET | Freq: Three times a day (TID) | ORAL | 0 refills | Status: DC | PRN

## 2020-08-02 MED ORDER — FENTANYL CITRATE (PF) 100 MCG/2ML IJ SOLN
100.0000 ug | Freq: Once | INTRAMUSCULAR | Status: AC
  Administered 2020-08-02: 100 ug via INTRAVENOUS
  Filled 2020-08-02: qty 2

## 2020-08-02 MED ORDER — ONDANSETRON HCL 4 MG/2ML IJ SOLN
4.0000 mg | Freq: Once | INTRAMUSCULAR | Status: AC
  Administered 2020-08-02: 4 mg via INTRAVENOUS
  Filled 2020-08-02: qty 2

## 2020-08-02 MED ORDER — METHOCARBAMOL 1000 MG/10ML IJ SOLN
500.0000 mg | Freq: Once | INTRAVENOUS | Status: AC
  Administered 2020-08-02: 500 mg via INTRAVENOUS
  Filled 2020-08-02: qty 500

## 2020-08-02 MED ORDER — METHOCARBAMOL 1000 MG/10ML IJ SOLN
500.0000 mg | Freq: Once | INTRAMUSCULAR | Status: DC
  Filled 2020-08-02: qty 5

## 2020-08-02 MED ORDER — OXYCODONE-ACETAMINOPHEN 5-325 MG PO TABS: 1.0000 | ORAL_TABLET | Freq: Four times a day (QID) | ORAL | 0 refills | Status: DC | PRN

## 2020-08-02 NOTE — ED Provider Notes (Signed)
[facility] [facility] DEPT Provider Note   CSN: [id number] Arrival date & time: 08/02/20  1408     History Chief Complaint  Patient presents with  . Flank Pain    Marcus [name] is a 80 y.o. male past medical history of CAD, colon cancer, arthritis, lumbar laminectomy, presenting to the emergency department with complaint of a few days of right-sided low back pain.  Pain initially began a few days ago.  Pain is worse with laying on his side in particular movements.  He is feeling spasms in his back.  It is better when he finds a comfortable position.  He is treated with an old prescription with hydrocodone with minimal relief.  Today his symptoms acutely worsened.  He states he was walking the dog and his pain got all of a sudden worse.  He did not have any recent trauma, the dog did not jerked him in any manner.  He states he may be starting to feel little bit nauseous so attributes this to the pain.  He has no radiation down his legs, numbness or weakness in his extremities, bowel or bladder incontinence, saddle paresthesia, fever, urinary symptoms, abdominal pain.   The history is provided by the patient.       Past Medical History:  Diagnosis Date  . Acute meniscal tear of knee left  . Arthritis    generalized  . Calcaneal spur   . Colon cancer [facility]) 1983   surgical removed. did not receive radiation or chemotherapy.  . Coronary artery disease    dr Stanford Breed  . Cramps of lower extremity    in the mornings occ.  . Depression   . Diverticulosis of colon (without mention of hemorrhage)   . Echocardiogram findings abnormal, without diagnosis 04-23-2010   normal LV function, mod. left atrial enlargement, mild right artrial enlargement and grade 1 diastolic dysfunction  . Edema of lower extremity    ankles, elevates legs  . Elevated prostate specific antigen (PSA)   . Esophageal reflux   . External hemorrhoids   . Frequency of urination    followed by dr  dalhstedt  . Gout   . Hiatal hernia   . History of colon cancer 1988  . Hyperlipidemia   . Lumbar spondylolysis   . Malaria 1964  . Normal nuclear stress test 04-23-2010   ef 51%,  septal hypokinesis, normal perfusion  . OSA (obstructive sleep apnea)    does not use CPAP   . Paraesophageal hernia    large  . Personal history of colonic polyps 08/21/2009   TUBULAR ADENOMAS  . Pneumonia    history of; last episode 2015  . Postgastric surgery syndromes   . Presence of permanent cardiac pacemaker   . Prostate cancer ([place]) 2016  . Unspecified essential hypertension   . Unspecified vitamin D deficiency   . Ventral hernia   . Vitamin B12 deficiency     Patient Active Problem List   Diagnosis Date Noted  . Bronchiectasis with acute exacerbation ([place]) 06/23/2020  . Pacemaker 03/26/2020  . Malignant neoplasm of prostate ([place]) 10/04/2018  . Syncope 08/01/2018  . AV block 08/01/2018  . Depression, recurrent ([place]) 12/23/2017  . Degeneration of lumbar intervertebral disc 10/14/2017  . Lumbar post-laminectomy syndrome 10/14/2017  . Primary osteoarthritis of knee 05/23/2015  . S/P knee replacement 05/23/2015  . Chronic cough 10/07/2014  . Chest pain, atypical 08/23/2014  . Elevated PSA 06/19/2014  . Preventative health care 06/19/2014  . Temporal  headache 09/26/2013  . Adjustment disorder with depressed mood 09/26/2013  . Osteoarthritis of left knee 03/12/2013  . Lumbar strain 12/14/2011  . Skin lesion of face 07/23/2011  . Lump in chest 03/31/2011  . Iron deficiency anemia, unspecified  01/19/2011  . Hemorrhoids 01/19/2011  . Bradycardia 01/08/2011  . UNSPECIFIED VITAMIN D DEFICIENCY 10/28/2009  . CALCANEAL SPUR 03/31/2009  . Hyperlipidemia 07/26/2008  . Obesity 10/17/2007  . INSOMNIA UNSPECIFIED 08/21/2007  . GOUT NOS 01/18/2007  . SLEEP APNEA, OBSTRUCTIVE 01/18/2007  . Essential hypertension 01/18/2007  . GERD 01/18/2007  . COLON CANCER, HX OF 01/18/2007  . COLONIC  POLYPS, HX OF 01/18/2007  . HIATAL HERNIA 10/26/2006  . DIVERTICULOSIS, COLON 07/07/2005    Past Surgical History:  Procedure Laterality Date  . BRONCHIAL WASHINGS  10/30/2019   Procedure: BRONCHIAL WASHINGS;  Surgeon: Julian Hy, DO;  Location: [facility];  Service: Endoscopy;;  . CARDIAC CATHETERIZATION    . CATARACT EXTRACTION W/ INTRAOCULAR LENS  IMPLANT, BILATERAL Bilateral   . COLONOSCOPY    . ESOPHAGOGASTRODUODENOSCOPY    . KNEE ARTHROSCOPY Right 2007  . KNEE ARTHROSCOPY  07/13/2011   Procedure: ARTHROSCOPY KNEE;  Surgeon: Cynda Familia;  Location: [facility];  Service: Orthopedics;  Laterality: Left;  partial menisectomy with chondrylplasty  . KNEE SURGERY Left   . LAMINECTOMY AND MICRODISCECTOMY LUMBAR SPINE  MARCH  2008   L3 -  4  . LAPAROSCOPIC INCISIONAL / UMBILICAL / VENTRAL HERNIA REPAIR  2006  . PACEMAKER IMPLANT N/A 08/02/2018   Procedure: PACEMAKER IMPLANT;  Surgeon: Evans Lance, MD;  Location: [facility];  Service: Cardiovascular;  Laterality: N/A;  . PROSTATE BIOPSY    . RADIOACTIVE SEED IMPLANT N/A 02/12/2019   Procedure: RADIOACTIVE SEED IMPLANT/BRACHYTHERAPY IMPLANT;  Surgeon: Franchot Gallo, MD;  Location: [facility];  Service: Urology;  Laterality: N/A;  90 MINS  . SHOULDER ARTHROSCOPY Right 05-23-2007  . SIGMOID COLECTOMY FOR CANCER  1989  . SPACE OAR INSTILLATION N/A 02/12/2019   Procedure: SPACE OAR INSTILLATION;  Surgeon: Franchot Gallo, MD;  Location: [facility];  Service: Urology;  Laterality: N/A;  . TOTAL KNEE ARTHROPLASTY Left 05/23/2015   Procedure: LEFT TOTAL KNEE ARTHROPLASTY;  Surgeon: Sydnee Cabal, MD;  Location: [facility];  Service: Orthopedics;  Laterality: Left;  [name] Kitchen VERTICAL BANDED GASTROPLASTY  1986  . VIDEO BRONCHOSCOPY N/A 10/30/2019   Procedure: VIDEO BRONCHOSCOPY WITH BRONICAL ALVEROLAR LAVAGE WITHOUT FLUORO;  Surgeon: Julian Hy, DO;  Location: [facility];  Service: Endoscopy;  Laterality: N/A;        Family History  Problem Relation Age of Onset  . Stroke Paternal Grandfather   . Heart disease Paternal Grandfather   . Esophagitis Father        died from perforated esophagus  . Breast cancer Mother   . Colon cancer Maternal Grandmother        questionable  . Esophageal cancer Neg Hx   . Prostate cancer Neg Hx   . Stomach cancer Neg Hx     Social History   Tobacco Use  . Smoking status: Former Smoker    Packs/day: 2.00    Years: 20.00    Pack years: 40.00    Types: Cigarettes    Quit date: 07/05/1974    Years since quitting: 46.1  . Smokeless tobacco: Never Used  Vaping Use  . Vaping Use: Never used  Substance Use Topics  . Alcohol use: Yes    Alcohol/week: 10.0 standard drinks  Types: 10 Glasses of wine per week    Comment: socially; also 1-2 glasses of wine or beer every other day   . Drug use: No    Home Medications Prior to Admission medications   Medication Sig Start Date End Date Taking? Authorizing Provider  oxyCODONE-acetaminophen (PERCOCET/ROXICET) 5-325 MG tablet Take 1-2 tablets by mouth every 6 (six) hours as needed for severe pain. 08/02/20  Yes Quentin Cornwall, [place] N, PA-C  tiZANidine (ZANAFLEX) 2 MG tablet Take 1 tablet (2 mg total) by mouth every 8 (eight) hours as needed for muscle spasms. 08/02/20  Yes Babatunde Seago, [place] N, PA-C  allopurinol (ZYLOPRIM) 300 MG tablet Take 1 tablet (300 mg total) by mouth daily. 07/03/20   Nafziger, Tommi Rumps, NP  amLODipine (NORVASC) 10 MG tablet Take 1 tablet (10 mg total) by mouth daily. Please schedule physical exam 08/01/20   Dorothyann Peng, NP  aspirin EC 81 MG tablet Take 1 tablet (81 mg total) by mouth daily. Swallow whole. 05/22/20   Lelon Perla, MD  Calcium Carb-Cholecalciferol (CALCIUM 600 + D PO) Take 1 tablet by mouth daily. 800 units of vitamin D    [provider]  docusate sodium (COLACE) 100 MG capsule Take 100 mg by mouth daily.     [provider]  meclizine (ANTIVERT) 25 MG tablet  Take 1 tablet (25 mg total) by mouth 3 (three) times daily as needed for dizziness. 04/22/20   Nafziger, Tommi Rumps, NP  metoCLOPramide (REGLAN) 10 MG tablet TAKE 1 TABLET (10 MG TOTAL) BY MOUTH AT BEDTIME AS NEEDED FOR NAUSEA. Patient taking differently: Take 10 mg by mouth in the morning. 03/06/20   Zehr, Laban Emperor, PA-C  Multiple Vitamin (MULTIVITAMIN) tablet Take 1 tablet by mouth daily.    [provider]  omeprazole (PRILOSEC) 40 MG capsule Take 1 capsule (40 mg total) by mouth daily. 12/27/19   Nafziger, Tommi Rumps, NP  oxymetazoline (AFRIN) 0.05 % nasal spray Place 1 spray into both nostrils 2 (two) times daily.    [provider]  potassium gluconate 595 (99 K) MG TABS tablet Take 595 mg by mouth daily.    [provider]  Respiratory Therapy Supplies (FLUTTER) DEVI 1 each by Does not apply route in the morning and at bedtime. Patient not taking: Reported on 06/23/2020 10/11/19   Noemi Chapel P, DO  rosuvastatin (CRESTOR) 20 MG tablet Take 1 tablet (20 mg total) by mouth daily. 05/22/20 08/20/20  Lelon Perla, MD  tamsulosin (FLOMAX) 0.4 MG CAPS capsule Take 0.4 mg by mouth daily.  03/04/16   [provider]  valsartan-hydrochlorothiazide (DIOVAN-HCT) 320-25 MG tablet Route: Take 1 tablet by mouth daily.  **DUE FOR YEARLY PHYSICAL** 07/29/20   Nafziger, Tommi Rumps, NP  vitamin B-12 (CYANOCOBALAMIN) 1000 MCG tablet Take 1,000 mcg by mouth daily.     [provider]    Allergies    Contrast media [iodinated diagnostic agents]  Review of Systems   Review of Systems  Constitutional: Negative for fever.  Gastrointestinal: Negative for abdominal pain and vomiting.  Genitourinary: Negative for dysuria and frequency.  Musculoskeletal: Positive for back pain.  Skin: Negative for rash.  Neurological: Negative for weakness and numbness.  All other systems reviewed and are negative.   Physical Exam Updated Vital Signs BP (!) 172/88   Pulse 61   Temp 97.6 F (36.4  C) (Oral)   Resp 18   SpO2 98%   Physical Exam Vitals and nursing note reviewed.  Constitutional:  Appearance: He is well-developed and well-nourished. He is not ill-appearing.  HENT:     Head: Normocephalic and atraumatic.  Eyes:     Conjunctiva/sclera: Conjunctivae normal.  Cardiovascular:     Rate and Rhythm: Normal rate and regular rhythm.  Pulmonary:     Effort: Pulmonary effort is normal. No respiratory distress.     Breath sounds: Normal breath sounds.  Abdominal:     General: Bowel sounds are normal.     Palpations: Abdomen is soft.     Tenderness: There is no abdominal tenderness. There is no right CVA tenderness, left CVA tenderness, guarding or rebound.  Musculoskeletal:     Comments: There is a 4cm long midline surgical scar at the mid to lower L-spine.  Just to the right lateral of this there is tenderness in the paraspinal musculature.  There are no skin changes.  Negative straight leg raise.  Skin:    General: Skin is warm.  Neurological:     Mental Status: He is alert.     Comments: Normal tone.  5/5 strength in BLE including strong and equal dorsiflexion/plantar flexion Sensory: light touch normal in BLE extremities.   2+ patellar reflexes b/l Gait: deferred due to pain CV: distal pulses palpable throughout    Psychiatric:        Mood and Affect: Mood and affect normal.        Behavior: Behavior normal.     ED Results / Procedures / Treatments   Labs (all labs ordered are listed, but only abnormal results are displayed) Labs Reviewed  URINALYSIS, ROUTINE W REFLEX MICROSCOPIC    EKG None  Radiology CT Lumbar Spine Wo Contrast  Result Date: 08/02/2020 CLINICAL DATA:  Low back pain EXAM: CT LUMBAR SPINE WITHOUT CONTRAST TECHNIQUE: Multidetector CT imaging of the lumbar spine was performed without intravenous contrast administration. Multiplanar CT image reconstructions were also generated. COMPARISON:  Lumbar MRI 12/11/2013 FINDINGS:  Segmentation: Normal Alignment: Mild retrolisthesis L1-2.  Remaining alignment normal. Vertebrae: Negative for fracture or mass. Paraspinal and other soft tissues: Atherosclerotic abdominal aorta and iliacs without aneurysm. No paraspinous mass or adenopathy. Disc levels: T12-L1: Disc degeneration with diffuse disc bulging. Bilateral facet degeneration. Mild subarticular stenosis bilaterally. L1-2: Mild retrolisthesis. Moderate to advanced facet degeneration bilaterally. Disc degeneration with disc bulging and spurring. Mild spinal stenosis. Moderate subarticular stenosis bilaterally. L2-3: Disc degeneration with diffuse disc bulging and moderate facet degeneration. Mild subarticular stenosis bilaterally. Mild spinal stenosis L3-4: Left laminectomy. Disc degeneration with diffuse endplate spurring and bilateral facet hypertrophy. Moderate subarticular stenosis bilaterally right greater than left due to spurring. L4-5: Advanced disc degeneration and disc space narrowing. Diffuse endplate spurring and advanced facet degeneration bilaterally. Severe subarticular and foraminal stenosis on the right. Moderate subarticular and foraminal stenosis on the left. Mild spinal stenosis. L5-S1: Mild disc degeneration. Advanced facet degeneration. Mild subarticular stenosis bilaterally. IMPRESSION: Multilevel disc and facet degeneration throughout the lumbar spine. Multilevel stenosis as described above. No acute findings. No change since the prior MRI 2015. Atherosclerotic aorta. Electronically Signed   By: Franchot Gallo M.D.   On: 08/02/2020 16:22    Procedures Procedures   Medications Ordered in ED Medications  methocarbamol (ROBAXIN) 500 mg in dextrose 5 % 50 mL IVPB (500 mg Intravenous New Bag/Given 08/02/20 1809)  fentaNYL (SUBLIMAZE) injection 100 mcg (100 mcg Intravenous Given 08/02/20 1543)  ondansetron (ZOFRAN) injection 4 mg (4 mg Intravenous Given 08/02/20 1544)    ED Course  I have reviewed the triage  vital  signs and the nursing notes.  Pertinent labs & imaging results that were available during my care of the patient were reviewed by me and considered in my medical decision making (see chart for details).    MDM Rules/Calculators/A&P                          Patient presenting with a few days of intermittent right-sided low back pain that seems positional.  History of L3-L4 lumbar laminectomy.  He has pain and tenderness just to the right of this in the spinal musculature.  No focal neuro deficits.  No red flags concerning for severe stenosis or cauda equina.  However considering history of surgery, patient's age, level of pain, will obtain CT imaging for further evaluation.  UA is also ordered though seems less likely nephrolithiasis considering presentation.  His pain is treated.   Patient with improvement in symptoms.  CT is stable from prior imaging of the lumbar spine.  UA is negative.  Symptoms seem to be musculoskeletal in nature.  Patient discussed with and evaluated by attending Dr. Almyra Free.  Will prescribe muscle relaxant and a few tabs of oxycodone for breakthrough pain.  He is instructed to follow-up with his spine specialist.  Also instructed of strict return precautions.  Patient is discharged in no acute distress.  Discussed results, findings, treatment and follow up. Patient advised of return precautions. Patient verbalized understanding and agreed with plan.   Final Clinical Impression(s) / ED Diagnoses Final diagnoses:  Right-sided low back pain without sciatica, unspecified chronicity    Rx / DC Orders ED Discharge Orders         Ordered    tiZANidine (ZANAFLEX) 2 MG tablet  Every 8 hours PRN        08/02/20 1740    oxyCODONE-acetaminophen (PERCOCET/ROXICET) 5-325 MG tablet  Every 6 hours PRN        08/02/20 1740           Sholonda Jobst, [place] N, PA-C 08/02/20 1837    Luna Fuse, MD 08/02/20 1918

## 2020-08-02 NOTE — Discharge Instructions (Addendum)
Please read instructions below.    Apply ice to your back for 20 minutes at a time.  You can also apply heat if this provides more relief.   You can take tizanidine every 8 hours as needed for muscle spasm.  Be aware this medication can make you drowsy; do not take while driving or drinking alcohol.   You can take oxycodone every 6 hours as needed for more severe/breakthrough pain. Follow closely with your spine doctor. Return to ER if new numbness or tingling in your arms or legs, inability to urinate, inability to hold your bowels, weakness in your legs, or uncontrollable severe pain.

## 2020-08-02 NOTE — ED Triage Notes (Signed)
Pt presents via EMS with R sided flank pain that has worsened in the past 2 days. States it spasms. Alert and oriented.

## 2020-08-04 ENCOUNTER — Ambulatory Visit (INDEPENDENT_AMBULATORY_CARE_PROVIDER_SITE_OTHER): Payer: No Typology Code available for payment source

## 2020-08-04 DIAGNOSIS — Z0000 Encounter for general adult medical examination without abnormal findings: Secondary | ICD-10-CM | POA: Diagnosis not present

## 2020-08-04 DIAGNOSIS — M5136 Other intervertebral disc degeneration, lumbar region: Secondary | ICD-10-CM | POA: Diagnosis not present

## 2020-08-04 NOTE — Progress Notes (Addendum)
Subjective:   Marcus [name] is a 80 y.o. male who presents for Medicare Annual/Subsequent preventive examination.  I connected with Etta Quill today by telephone and verified that I am speaking with the correct person using two identifiers. Location patient: home Location provider: work Persons participating in the virtual visit: patient, provider.   I discussed the limitations, risks, security and privacy concerns of performing an evaluation and management service by telephone and the availability of in person appointments. I also discussed with the patient that there may be a patient responsible charge related to this service. The patient expressed understanding and verbally consented to this telephonic visit.    Interactive audio and video telecommunications were attempted between this provider and patient, however failed, due to patient having technical difficulties OR patient did not have access to video capability.  We continued and completed visit with audio only.      Review of Systems    N/A  Cardiac Risk Factors include: advanced age (>41men, >61 women);male gender;dyslipidemia;hypertension     Objective:    Today's Vitals   08/04/20 1116  PainSc: 2    There is no height or weight on file to calculate BMI.  Advanced Directives 08/04/2020 10/30/2019 10/19/2019 03/09/2019 02/12/2019 02/07/2019 12/06/2018  Does Patient Have a Medical Advance Directive? No No No Yes Yes Yes No  Type of Advance Directive - - - Living will [place];Living will [place];Living will -  Does patient want to make changes to medical advance directive? - - - - No - Patient declined No - Patient declined -  Copy of [place] in Chart? - - - - No - copy requested No - copy requested -  Would patient like information on creating a medical advance directive? No - Patient declined Yes (MAU/Ambulatory/Procedural Areas - Information given) No -  Patient declined - - - No - Patient declined    Current Medications (verified) Outpatient Encounter Medications as of 08/04/2020  Medication Sig  . allopurinol (ZYLOPRIM) 300 MG tablet Take 1 tablet (300 mg total) by mouth daily.  [name] 10 MG tablet Take 1 tablet (10 mg total) by mouth daily. Please schedule physical exam  . aspirin EC 81 MG tablet Take 1 tablet (81 mg total) by mouth daily. Swallow whole.  . Calcium Carb-Cholecalciferol (CALCIUM 600 + D PO) Take 1 tablet by mouth daily. 800 units of vitamin D  . docusate sodium (COLACE) 100 MG capsule Take 100 mg by mouth daily.   . Multiple Vitamin (MULTIVITAMIN) tablet Take 1 tablet by mouth daily.  [name] Kitchen omeprazole (PRILOSEC) 40 MG capsule Take 1 capsule (40 mg total) by mouth daily.  [name] Kitchen oxyCODONE-acetaminophen (PERCOCET/ROXICET) 5-325 MG tablet Take 1-2 tablets by mouth every 6 (six) hours as needed for severe pain.  [name] Kitchen oxymetazoline (AFRIN) 0.05 % nasal spray Place 1 spray into both nostrils 2 (two) times daily.  . potassium gluconate 595 (99 K) MG TABS tablet Take 595 mg by mouth daily.  . rosuvastatin (CRESTOR) 20 MG tablet Take 1 tablet (20 mg total) by mouth daily.  [name] Kitchen tiZANidine (ZANAFLEX) 2 MG tablet Take 1 tablet (2 mg total) by mouth every 8 (eight) hours as needed for muscle spasms.  . valsartan-hydrochlorothiazide (DIOVAN-HCT) 320-25 MG tablet Route: Take 1 tablet by mouth daily.  **DUE FOR YEARLY PHYSICAL**  . vitamin B-12 (CYANOCOBALAMIN) 1000 MCG tablet Take 1,000 mcg by mouth daily.   . meclizine (ANTIVERT) 25 MG tablet Take  1 tablet (25 mg total) by mouth 3 (three) times daily as needed for dizziness. (Patient not taking: Reported on 08/04/2020)  . metoCLOPramide (REGLAN) 10 MG tablet TAKE 1 TABLET (10 MG TOTAL) BY MOUTH AT BEDTIME AS NEEDED FOR NAUSEA. (Patient not taking: Reported on 08/04/2020)  . Respiratory Therapy Supplies (FLUTTER) DEVI 1 each by Does not apply route in the morning and at bedtime. (Patient not  taking: No sig reported)  . tamsulosin (FLOMAX) 0.4 MG CAPS capsule Take 0.4 mg by mouth daily.  (Patient not taking: Reported on 08/04/2020)   No facility-administered encounter medications on file as of 08/04/2020.    Allergies (verified) Contrast media [iodinated diagnostic agents]   History: Past Medical History:  Diagnosis Date  . Acute meniscal tear of knee left  . Arthritis    generalized  . Calcaneal spur   . Colon cancer [facility]) 1983   surgical removed. did not receive radiation or chemotherapy.  . Coronary artery disease    dr Stanford Breed  . Cramps of lower extremity    in the mornings occ.  . Depression   . Diverticulosis of colon (without mention of hemorrhage)   . Echocardiogram findings abnormal, without diagnosis 04-23-2010   normal LV function, mod. left atrial enlargement, mild right artrial enlargement and grade 1 diastolic dysfunction  . Edema of lower extremity    ankles, elevates legs  . Elevated prostate specific antigen (PSA)   . Esophageal reflux   . External hemorrhoids   . Frequency of urination    followed by dr dalhstedt  . Gout   . Hiatal hernia   . History of colon cancer 1988  . Hyperlipidemia   . Lumbar spondylolysis   . Malaria 1964  . Normal nuclear stress test 04-23-2010   ef 51%,  septal hypokinesis, normal perfusion  . OSA (obstructive sleep apnea)    does not use CPAP   . Paraesophageal hernia    large  . Personal history of colonic polyps 08/21/2009   TUBULAR ADENOMAS  . Pneumonia    history of; last episode 2015  . Postgastric surgery syndromes   . Presence of permanent cardiac pacemaker   . Prostate cancer ([place]) 2016  . Unspecified essential hypertension   . Unspecified vitamin D deficiency   . Ventral hernia   . Vitamin B12 deficiency    Past Surgical History:  Procedure Laterality Date  . BRONCHIAL WASHINGS  10/30/2019   Procedure: BRONCHIAL WASHINGS;  Surgeon: Julian Hy, DO;  Location: [facility];  Service:  Endoscopy;;  . CARDIAC CATHETERIZATION    . CATARACT EXTRACTION W/ INTRAOCULAR LENS  IMPLANT, BILATERAL Bilateral   . COLONOSCOPY    . ESOPHAGOGASTRODUODENOSCOPY    . KNEE ARTHROSCOPY Right 2007  . KNEE ARTHROSCOPY  07/13/2011   Procedure: ARTHROSCOPY KNEE;  Surgeon: Cynda Familia;  Location: [place];  Service: Orthopedics;  Laterality: Left;  partial menisectomy with chondrylplasty  . KNEE SURGERY Left   . LAMINECTOMY AND MICRODISCECTOMY LUMBAR SPINE  MARCH  2008   L3 -  4  . LAPAROSCOPIC INCISIONAL / UMBILICAL / VENTRAL HERNIA REPAIR  2006  . PACEMAKER IMPLANT N/A 08/02/2018   Procedure: PACEMAKER IMPLANT;  Surgeon: Evans Lance, MD;  Location: [place] [facility];  Service: Cardiovascular;  Laterality: N/A;  . PROSTATE BIOPSY    . RADIOACTIVE SEED IMPLANT N/A 02/12/2019   Procedure: RADIOACTIVE SEED IMPLANT/BRACHYTHERAPY IMPLANT;  Surgeon: Franchot Gallo, MD;  [facility];  Service: Urology;  Laterality:  N/A;  90 MINS  . SHOULDER ARTHROSCOPY Right 05-23-2007  . SIGMOID COLECTOMY FOR CANCER  1989  . SPACE OAR INSTILLATION N/A 02/12/2019   Procedure: SPACE OAR INSTILLATION;  Surgeon: Franchot Gallo, MD;  [facility];  Service: Urology;  Laterality: N/A;  . TOTAL KNEE ARTHROPLASTY Left 05/23/2015   Procedure: LEFT TOTAL KNEE ARTHROPLASTY;  Surgeon: Sydnee Cabal, MD;  [facility];  Service: Orthopedics;  Laterality: Left;  [name] Kitchen VERTICAL BANDED GASTROPLASTY  1986  . VIDEO BRONCHOSCOPY N/A 10/30/2019   Procedure: VIDEO BRONCHOSCOPY WITH BRONICAL ALVEROLAR LAVAGE WITHOUT FLUORO;  Surgeon: Julian Hy, DO;  [facility];  Service: Endoscopy;  Laterality: N/A;   Family History  Problem Relation Age of Onset  . Stroke Paternal Grandfather   . Heart disease Paternal Grandfather   . Esophagitis Father        died from perforated esophagus  . Breast cancer Mother   . Colon cancer Maternal Grandmother        questionable  . Esophageal  cancer Neg Hx   . Prostate cancer Neg Hx   . Stomach cancer Neg Hx    Social History   Socioeconomic History  . Marital status: Married    Spouse name: Not on file  . Number of children: 4  . Years of education: Not on file  . Highest education level: Not on file  Occupational History  . Occupation: retired    Fish farm manager: [employer]  Tobacco Use  . Smoking status: Former Smoker    Packs/day: 2.00    Years: 20.00    Pack years: 40.00    Types: Cigarettes    Quit date: 07/05/1974    Years since quitting: 46.1  . Smokeless tobacco: Never Used  Vaping Use  . Vaping Use: Never used  Substance and Sexual Activity  . Alcohol use: Yes    Alcohol/week: 10.0 standard drinks    Types: 10 Glasses of wine per week    Comment: socially; also 1-2 glasses of wine or beer every other day   . Drug use: No  . Sexual activity: Not Currently  Other Topics Concern  . Not on file  Social History Narrative   Recently widowed   Former Smoker    Alcohol use-yes 1-2 drinks per day      Occupation: Retired Midwife      Originally from [place], [place] - in [place] > 10 yrs as of 2017      Social Determinants of Health   Financial Resource Strain: [place]   . Difficulty of Paying Living Expenses: Not hard at all  Food Insecurity: No Food Insecurity  . Worried About Charity fundraiser in the Last Year: Never true  . Ran Out of Food in the Last Year: Never true  Transportation Needs: No Transportation Needs  . Lack of Transportation (Medical): No  . Lack of Transportation (Non-Medical): No  Physical Activity: Inactive  . Days of Exercise per Week: 0 days  . Minutes of Exercise per Session: 0 min  Stress: No Stress Concern Present  . Feeling of Stress : Not at all  Social Connections: Moderately Isolated  . Frequency of Communication with Friends and Family: Three times a week  . Frequency of Social Gatherings with Friends and Family: More than three times a week  . Attends Religious  Services: More than 4 times per year  . Active Member of Clubs or Organizations: No  . Attends Archivist Meetings:  Never  . Marital Status: Widowed    Tobacco Counseling Counseling given: Not Answered   Clinical Intake:  Pre-visit preparation completed: Yes  Pain : 0-10 Pain Score: 2  Pain Type: Acute pain Pain Location: Back Pain Orientation: Right,Lower Pain Descriptors / Indicators: Stabbing Pain Onset: In the past 7 days Pain Frequency: Constant Pain Relieving Factors: Pain medications  Pain Relieving Factors: Pain medications  Nutritional Risks: Nausea/ vomitting/ diarrhea (Diarrhea) Diabetes: No  How often do you need to have someone help you when you read instructions, pamphlets, or other written materials from your doctor or pharmacy?: 1 - Never What is the last grade level you completed in school?: 1 year of College  Diabetic?No   Interpreter Needed?: No  Information entered by :: [place] of Daily Living In your present state of health, do you have any difficulty performing the following activities: 08/04/2020  Hearing? Y  Comment Patient states wears bilateral hearing aids  Vision? N  Difficulty concentrating or making decisions? N  Walking or climbing stairs? N  Dressing or bathing? N  Doing errands, shopping? N  Preparing Food and eating ? N  Using the Toilet? N  In the past six months, have you accidently leaked urine? N  Do you have problems with loss of bowel control? N  Managing your Medications? N  Managing your Finances? N  Housekeeping or managing your Housekeeping? N  Some recent data might be hidden    Patient Care Team: Dorothyann Peng, NP as PCP - General (Family Medicine) Franchot Gallo, MD as Consulting Physician (Urology) Suella Broad, MD as Consulting Physician (Physical Medicine and Rehabilitation) Melina Schools, MD as Consulting Physician (Orthopedic Surgery) Viona Gilmore, [facility] as  Pharmacist (Pharmacist)  Indicate any recent Medical Services you may have received from other than [facility] providers in the past year (date may be approximate).     Assessment:   This is a routine wellness examination for [place].  Hearing/Vision screen  Hearing Screening   125Hz  250Hz  500Hz  1000Hz  2000Hz  3000Hz  4000Hz  6000Hz  8000Hz   Right ear:           Left ear:           Vision Screening Comments: Patient states gets eyes examined in  years will get next eye exam at [place] issues and exercise activities discussed: Current Exercise Habits: The patient does not participate in regular exercise at present  Goals    . Pharmacy Care Plan     CARE PLAN ENTRY (see longitudinal plan of care for additional care plan information)  Current Barriers:  . Chronic Disease Management support, education, and care coordination needs related to Hypertension, Hyperlipidemia, GERD, BPH, Gout, and constipation   Hypertension BP Readings from Last 3 Encounters:  03/26/20 126/84  03/05/20 120/82  02/28/20 122/60   . Pharmacist Clinical Goal(s): o Over the next 120 days, patient will work with PharmD and providers to maintain BP goal <140/90 . Current regimen:  . Amlodipine 10 mg 1 tablet daily . Valsartan-HCTZ 320-25 mg 1 tablet daily . Interventions: o We discussed the importance of checking blood pressure at home while taking blood pressure medicines o We discussed the impact of exercise on blood pressure lowering . Patient self care activities - Over the next 120 days, patient will: o Obtain blood pressure cuff o Check blood pressure weekly, document, and provide at future appointments o Ensure daily salt intake < 2300 mg/day  Hyperlipidemia Lab Results  Component Value Date/Time  [place] 65 03/04/2017 08:08 AM   LDLCALC 65 01/01/2011 04:09 PM   . Pharmacist Clinical Goal(s): o Over the next 120 days, patient will work with PharmD and providers to maintain LDL goal <  70 . Current regimen:  o Simvastatin 20 mg 1 tablet at bedtime . Interventions: o We discussed increasing fiber intake to 10-25 g/day in order to lower cholesterol . Patient self care activities - Over the next 120 days, patient will: o Continue current medications  GERD . Pharmacist Clinical Goal(s): o Over the next 120 days, patient will work with PharmD and providers to manage symptoms of heartburn/indigestion . Current regimen:  . Omeprazole 40 mg 1 capsule daily . Metoclopramide 10 mg 1 tablet every morning . Interventions: o We discussed non medication related ways to avoid heartburn such as avoiding eating 2 hours before bedtime, elevating the head of your bed, avoiding triggering foods such as spicy or acidic foods . Patient self care activities - Over the next 120 days, patient will: o Continue current medications o Continue to avoid eating after 6:30-7 each night to avoid heartburn  Gout . Pharmacist Clinical Goal(s) o Over the next 120 days, patient will work with PharmD and providers to avoid gout flare ups . Current regimen:  o Allopurinol 300 mg 1 tablet daily . Interventions: o We discussed avoiding alcohol and foods that can trigger gout such as soda, shellfish, organ meats, etc. . Patient self care activities - Over the next 120 days, patient will: o Continue current medications  BPH . Pharmacist Clinical Goal(s) o Over the next 120 days, patient will work with PharmD and providers to manage symptoms of BPH . Current regimen:  o Tamsulosin 0.4 mg 1 capsule daily . Interventions: o We discussed avoiding alcohol and foods that can trigger gout such as soda, shellfish, organ meats, etc. . Patient self care activities - Over the next 120 days, patient will: o Continue current medications  Constipation . Pharmacist Clinical Goal(s) o Over the next 120 days, patient will work with PharmD and providers to avoid constipation . Current regimen:  o Docusate 100 mg 1  tablet daily . Interventions: o We discussed drinking plenty of water and increasing fiber intake through foods such as green leafy vegetables, fruit, and oatmeal . Patient self care activities - Over the next 120 days, patient will: o Continue current medications o Consider fiber supplement if constipation worsens or unable to get adequate fiber through diet  Medication management . Pharmacist Clinical Goal(s): o Over the next 120 days, patient will work with PharmD and providers to maintain optimal medication adherence . Current pharmacy: [facility] . Interventions o Comprehensive medication review performed. o Continue current medication management strategy . Patient self care activities - Over the next 120 days, patient will: o Take medications as prescribed o Report any questions or concerns to PharmD and/or provider(s)  Initial goal documentation     . Weight (lb) < 240 lb (108.9 kg)      Depression Screen PHQ 2/9 Scores 08/04/2020 03/05/2020 03/24/2018 08/07/2015 10/31/2013  PHQ - 2 Score 6 1 0 0 0  PHQ- 9 Score 15 - - - -    Fall Risk Fall Risk  08/04/2020 03/05/2020 03/24/2018 08/07/2015 10/31/2013  Falls in the past year? 0 1 No No No  Number falls in past yr: 0 1 - - -  Injury with Fall? 0 0 - - -  Risk for fall due to : No Fall Risks History of fall(s) - - -  Follow up Falls evaluation completed;Falls prevention discussed Falls evaluation completed - - -    FALL RISK PREVENTION PERTAINING TO THE HOME:  Any stairs in or around the home? No  If so, are there any without handrails? No  Home free of loose throw rugs in walkways, pet beds, electrical cords, etc? Yes  Adequate lighting in your home to reduce risk of falls? Yes   ASSISTIVE DEVICES UTILIZED TO PREVENT FALLS:  Life alert? No  Use of a cane, walker or w/c? No  Grab bars in the bathroom? Yes  Shower chair or bench in shower? Yes  Elevated toilet seat or a handicapped toilet? No     Cognitive Function: Normal  cognitive status assessed by direct observation by this Nurse Health Advisor. No abnormalities found.   MMSE - Mini Mental State Exam 03/05/2020  Orientation to time 5  Orientation to Place 5  Registration 3  Attention/ Calculation 5  Recall 2  Language- name 2 objects 2  Language- repeat 1  Language- follow 3 step command 3  Language- read & follow direction 1  Write a sentence 1  Copy design 1  Total score 29        Immunizations Immunization History  Administered Date(s) Administered  . Influenza Whole 06/05/2007, 04/30/2008, 03/31/2009  . Influenza, High Dose Seasonal PF 06/19/2014, 07/30/2015, 03/03/2017  . Influenza,inj,quad, With Preservative 04/04/2017  . PFIZER(Purple Top)SARS-COV-2 Vaccination 08/13/2019, 09/11/2019  . Pneumococcal Conjugate-13 06/19/2014, 07/30/2015  . Pneumococcal Polysaccharide-23 12/11/2003, 03/03/2017  . Td 03/31/2009  . Tdap 10/19/2019    TDAP status: Up to date  Flu Vaccine status: Declined, Education has been provided regarding the importance of this vaccine but patient still declined. Advised may receive this vaccine at local pharmacy or Health Dept. Aware to provide a copy of the vaccination record if obtained from local pharmacy or Health Dept. Verbalized acceptance and understanding.  Pneumococcal vaccine status: Up to date  Covid-19 vaccine status: Completed vaccines  Qualifies for Shingles Vaccine? Yes   Zostavax completed No   Shingrix Completed?: No.    Education has been provided regarding the importance of this vaccine. Patient has been advised to call insurance company to determine out of pocket expense if they have not yet received this vaccine. Advised may also receive vaccine at local pharmacy or Health Dept. Verbalized acceptance and understanding.  Screening Tests Health Maintenance  Topic Date Due  . COLONOSCOPY (Pts 45-15yrs Insurance coverage will need to be confirmed)  11/13/2017  . COVID-19 Vaccine (3 - Pfizer risk  4-dose series) 10/09/2019  . INFLUENZA VACCINE  10/02/2020 (Originally 02/03/2020)  . PNA vac Low Risk Adult  Completed    Health Maintenance  Health Maintenance Due  Topic Date Due  . COLONOSCOPY (Pts 45-75yrs Insurance coverage will need to be confirmed)  11/13/2017  . COVID-19 Vaccine (3 - Pfizer risk 4-dose series) 10/09/2019    Colorectal cancer screening: No longer required.   Lung Cancer Screening: (Low Dose CT Chest recommended if Age 25-80 years, 30 pack-year currently smoking OR have quit w/in 15years.) does not qualify.   Lung Cancer Screening Referral: N/A   Additional Screening:  Hepatitis C Screening: does not qualify;   Vision Screening: Recommended annual ophthalmology exams for early detection of glaucoma and other disorders of the eye. Is the patient up to date with their annual eye exam?  No  Who is the provider or what is the [facility] the [facility] the [facility]  If pt is not established with a provider, would they like to be referred to a provider to establish care? No .   Dental Screening: Recommended annual dental exams for proper oral hygiene  Community Resource Referral / Chronic Care Management: CRR required this visit?  No   CCM required this visit?  No      Plan:     I have personally reviewed and noted the following in the patient's chart:   . Medical and social history . Use of alcohol, tobacco or illicit drugs  . Current medications and supplements . Functional ability and status . Nutritional status . Physical activity . Advanced directives . List of other physicians . Hospitalizations, surgeries, and ER visits in previous 12 months . Vitals . Screenings to include cognitive, depression, and falls . Referrals and appointments  In addition, I have reviewed and discussed with patient certain preventive protocols, quality metrics, and best practice recommendations. A written personalized care plan for  preventive services as well as general preventive health recommendations were provided to patient.     Ofilia Neas, LPN   [id number]   Nurse Notes: None

## 2020-08-04 NOTE — Patient Instructions (Signed)
Marcus [name] , Thank you for taking time to come for your Medicare Wellness Visit. I appreciate your ongoing commitment to your health goals. Please review the following plan we discussed and let me know if I can assist you in the future.   Screening recommendations/referrals: Colonoscopy: No longer required  Recommended yearly ophthalmology/optometry visit for glaucoma screening and checkup Recommended yearly dental visit for hygiene and checkup  Vaccinations: Influenza vaccine: Patient declined  Pneumococcal vaccine: Completed series  Tdap vaccine: Up to date, next due 10/18/2020 Shingles vaccine: Up to date    Advanced directives: Advance directive discussed with you today. Even though you declined this today please call our office should you change your mind and we can give you the proper paperwork for you to fill out.   Conditions/risks identified: None   Next appointment: 08/11/2020 @ 11:30 am with Pharmacist at [facility] 81 Years and Older, Male Preventive care refers to lifestyle choices and visits with your health care provider that can promote health and wellness. What does preventive care include?  A yearly physical exam. This is also called an annual well check.  Dental exams once or twice a year.  Routine eye exams. Ask your health care provider how often you should have your eyes checked.  Personal lifestyle choices, including:  Daily care of your teeth and gums.  Regular physical activity.  Eating a healthy diet.  Avoiding tobacco and drug use.  Limiting alcohol use.  Practicing safe sex.  Taking low doses of aspirin every day.  Taking vitamin and mineral supplements as recommended by your health care provider. What happens during an annual well check? The services and screenings done by your health care provider during your annual well check will depend on your age, overall health, lifestyle risk factors, and family history of  disease. Counseling  Your health care provider may ask you questions about your:  Alcohol use.  Tobacco use.  Drug use.  Emotional well-being.  Home and relationship well-being.  Sexual activity.  Eating habits.  History of falls.  Memory and ability to understand (cognition).  Work and work Statistician. Screening  You may have the following tests or measurements:  Height, weight, and BMI.  Blood pressure.  Lipid and cholesterol levels. These may be checked every 5 years, or more frequently if you are over 81 years old.  Skin check.  Lung cancer screening. You may have this screening every year starting at age 81 if you have a 30-pack-year history of smoking and currently smoke or have quit within the past 15 years.  Fecal occult blood test (FOBT) of the stool. You may have this test every year starting at age 81.  Flexible sigmoidoscopy or colonoscopy. You may have a sigmoidoscopy every 5 years or a colonoscopy every 10 years starting at age 81.  Prostate cancer screening. Recommendations will vary depending on your family history and other risks.  Hepatitis C blood test.  Hepatitis B blood test.  Sexually transmitted disease (STD) testing.  Diabetes screening. This is done by checking your blood sugar (glucose) after you have not eaten for a while (fasting). You may have this done every 1-3 years.  Abdominal aortic aneurysm (AAA) screening. You may need this if you are a current or former smoker.  Osteoporosis. You may be screened starting at age 81 if you are at high risk. Talk with your health care provider about your test results, treatment options, and if necessary, the need for more  tests. Vaccines  Your health care provider may recommend certain vaccines, such as:  Influenza vaccine. This is recommended every year.  Tetanus, diphtheria, and acellular pertussis (Tdap, Td) vaccine. You may need a Td booster every 10 years.  Zoster vaccine. You may  need this after age 81.  Pneumococcal 13-valent conjugate (PCV13) vaccine. One dose is recommended after age 81.  Pneumococcal polysaccharide (PPSV23) vaccine. One dose is recommended after age 81. Talk to your health care provider about which screenings and vaccines you need and how often you need them. This information is not intended to replace advice given to you by your health care provider. Make sure you discuss any questions you have with your health care provider. Document Released: 07/18/2015 Document Revised: 03/10/2016 Document Reviewed: 04/22/2015 Elsevier Interactive Patient Education  2017 [place] Prevention in the Home Falls can cause injuries. They can happen to people of all ages. There are many things you can do to make your home safe and to help prevent falls. What can I do on the outside of my home?  Regularly fix the edges of walkways and driveways and fix any cracks.  Remove anything that might make you trip as you walk through a door, such as a raised step or threshold.  Trim any bushes or trees on the path to your home.  Use bright outdoor lighting.  Clear any walking paths of anything that might make someone trip, such as rocks or tools.  Regularly check to see if handrails are loose or broken. Make sure that both sides of any steps have handrails.  Any raised decks and porches should have guardrails on the edges.  Have any leaves, snow, or ice cleared regularly.  Use sand or salt on walking paths during winter.  Clean up any spills in your garage right away. This includes oil or grease spills. What can I do in the bathroom?  Use night lights.  Install grab bars by the toilet and in the tub and shower. Do not use towel bars as grab bars.  Use non-skid mats or decals in the tub or shower.  If you need to sit down in the shower, use a plastic, non-slip stool.  Keep the floor dry. Clean up any water that spills on the floor as soon as it  happens.  Remove soap buildup in the tub or shower regularly.  Attach bath mats securely with double-sided non-slip rug tape.  Do not have throw rugs and other things on the floor that can make you trip. What can I do in the bedroom?  Use night lights.  Make sure that you have a light by your bed that is easy to reach.  Do not use any sheets or blankets that are too big for your bed. They should not hang down onto the floor.  Have a firm chair that has side arms. You can use this for support while you get dressed.  Do not have throw rugs and other things on the floor that can make you trip. What can I do in the kitchen?  Clean up any spills right away.  Avoid walking on wet floors.  Keep items that you use a lot in easy-to-reach places.  If you need to reach something above you, use a strong step stool that has a grab bar.  Keep electrical cords out of the way.  Do not use floor polish or wax that makes floors slippery. If you must use wax, use non-skid floor  wax.  Do not have throw rugs and other things on the floor that can make you trip. What can I do with my stairs?  Do not leave any items on the stairs.  Make sure that there are handrails on both sides of the stairs and use them. Fix handrails that are broken or loose. Make sure that handrails are as long as the stairways.  Check any carpeting to make sure that it is firmly attached to the stairs. Fix any carpet that is loose or worn.  Avoid having throw rugs at the top or bottom of the stairs. If you do have throw rugs, attach them to the floor with carpet tape.  Make sure that you have a light switch at the top of the stairs and the bottom of the stairs. If you do not have them, ask someone to add them for you. What else can I do to help prevent falls?  Wear shoes that:  Do not have high heels.  Have rubber bottoms.  Are comfortable and fit you well.  Are closed at the toe. Do not wear sandals.  If you  use a stepladder:  Make sure that it is fully opened. Do not climb a closed stepladder.  Make sure that both sides of the stepladder are locked into place.  Ask someone to hold it for you, if possible.  Clearly mark and make sure that you can see:  Any grab bars or handrails.  First and last steps.  Where the edge of each step is.  Use tools that help you move around (mobility aids) if they are needed. These include:  Canes.  Walkers.  Scooters.  Crutches.  Turn on the lights when you go into a dark area. Replace any light bulbs as soon as they burn out.  Set up your furniture so you have a clear path. Avoid moving your furniture around.  If any of your floors are uneven, fix them.  If there are any pets around you, be aware of where they are.  Review your medicines with your doctor. Some medicines can make you feel dizzy. This can increase your chance of falling. Ask your doctor what other things that you can do to help prevent falls. This information is not intended to replace advice given to you by your health care provider. Make sure you discuss any questions you have with your health care provider. Document Released: 04/17/2009 Document Revised: 11/27/2015 Document Reviewed: 07/26/2014 Elsevier Interactive Patient Education  2017 [employer].

## 2020-08-05 ENCOUNTER — Other Ambulatory Visit (HOSPITAL_COMMUNITY)
Admission: RE | Admit: 2020-08-05 | Discharge: 2020-08-05 | Disposition: A | Discharge status: Home / Self Care | Payer: Medicare Other | Source: Ambulatory Visit | Attending: Pulmonary Disease | Admitting: Pulmonary Disease

## 2020-08-05 DIAGNOSIS — Z20822 Contact with and (suspected) exposure to covid-19: Secondary | ICD-10-CM | POA: Insufficient documentation

## 2020-08-05 DIAGNOSIS — Z01812 Encounter for preprocedural laboratory examination: Secondary | ICD-10-CM | POA: Insufficient documentation

## 2020-08-05 LAB — SARS CORONAVIRUS 2 (TAT 6-24 HRS): SARS Coronavirus 2: NEGATIVE

## 2020-08-07 ENCOUNTER — Other Ambulatory Visit: Payer: Self-pay

## 2020-08-07 ENCOUNTER — Encounter: Payer: Self-pay | Admitting: Pulmonary Disease

## 2020-08-07 ENCOUNTER — Ambulatory Visit: Payer: Medicare Other | Admitting: Pulmonary Disease

## 2020-08-07 ENCOUNTER — Ambulatory Visit (INDEPENDENT_AMBULATORY_CARE_PROVIDER_SITE_OTHER): Payer: No Typology Code available for payment source | Admitting: Pulmonary Disease

## 2020-08-07 DIAGNOSIS — G4733 Obstructive sleep apnea (adult) (pediatric): Secondary | ICD-10-CM | POA: Diagnosis not present

## 2020-08-07 DIAGNOSIS — J471 Bronchiectasis with (acute) exacerbation: Secondary | ICD-10-CM | POA: Diagnosis not present

## 2020-08-07 DIAGNOSIS — J479 Bronchiectasis, uncomplicated: Secondary | ICD-10-CM | POA: Diagnosis not present

## 2020-08-07 LAB — PULMONARY FUNCTION TEST
DL/VA % pred: 87 %
DL/VA: 3.44 ml/min/mmHg/L
DLCO cor % pred: 96 %
DLCO cor: 22.16 ml/min/mmHg
DLCO unc % pred: 96 %
DLCO unc: 22.16 ml/min/mmHg
FEF 25-75 Post: 3.38 L/sec
FEF 25-75 Pre: 2.17 L/sec
FEF2575-%Change-Post: 55 %
FEF2575-%Pred-Post: 188 %
FEF2575-%Pred-Pre: 121 %
FEV1-%Change-Post: 10 %
FEV1-%Pred-Post: 123 %
FEV1-%Pred-Pre: 111 %
FEV1-Post: 3.22 L
FEV1-Pre: 2.92 L
FEV1FVC-%Change-Post: 10 %
FEV1FVC-%Pred-Pre: 105 %
FEV6-%Change-Post: 0 %
FEV6-%Pred-Post: 112 %
FEV6-%Pred-Pre: 112 %
FEV6-Post: 3.88 L
FEV6-Pre: 3.86 L
FEV6FVC-%Change-Post: 0 %
FEV6FVC-%Pred-Post: 107 %
FEV6FVC-%Pred-Pre: 107 %
FVC-%Change-Post: 0 %
FVC-%Pred-Post: 104 %
FVC-%Pred-Pre: 104 %
FVC-Post: 3.88 L
FVC-Pre: 3.87 L
Post FEV1/FVC ratio: 83 %
Post FEV6/FVC ratio: 100 %
Pre FEV1/FVC ratio: 75 %
Pre FEV6/FVC Ratio: 100 %
RV % pred: 120 %
RV: 3.08 L
TLC % pred: 104 %
TLC: 6.93 L

## 2020-08-07 NOTE — Progress Notes (Signed)
PFT done today. 

## 2020-08-07 NOTE — Assessment & Plan Note (Signed)
Minimal bronchiectasis right upper lobe, some groundglass opacity noted on prior CT which may be related to reflux or aspiration. Lung function is normal which is reassuring.  I offered him hypertonic saline nebs for his chronic cough but his symptoms are not daily and he is not interested in this. We will observe currently and repeat an annual high-resolution CT chest 09/2020 to reassess for progression.

## 2020-08-07 NOTE — Assessment & Plan Note (Signed)
Download shows excellent control of events on 9 cm with good compliance and minimal leak.  He is very compliant and CPAP recently helped improve his daytime somnolence and fatigue. CPAP supplies will be renewed for a year

## 2020-08-07 NOTE — Patient Instructions (Signed)
  Please schedule high-resolution CT chest 1 year follow-up of ILD/bronchiectasis  Call us if cough gets worse. CPAP is working well on current settings

## 2020-08-07 NOTE — Progress Notes (Signed)
   Subjective:    Patient ID: Marcus [name], male    DOB: 08-25-1940, 80 y.o.   MRN: [id number]  HPI  80 year old for follow-up of chronic cough and OSA Extensive evaluation is only showed mild bronchiectasis. PMH -bariatric surgery He has undergone ENT and GI evaluation for GERD. He underwent extensive evaluation for chronic productive cough with sticky white sputum, BAL was negative for AFB CT only showed very mild bronchiectasis and faint groundglass opacity right upper lobe with a pulmonary nodule  He lost his wife due to Alzheimer's and is still trying to cope  He continues to have this cough. We reviewed the lung functions today which was normal. He uses a full facemask.  He used to be on a BiPAP in 2012 but is now maintained on CPAP of 9 cm.  This is working well no problems with mask or pressure he uses this every night  Significant tests/ events reviewed  PFTs 08/2020 nml  10/30/2019 BAL cell count with differential: 185 cells, 2% PMNs, 80% monocytes, 15% lymphocytes  Micro: 10/30/19 BAL culture-normal flora 10/30/19 BAL fungus-KOH smear negative 10/30/2019 BAL AFB- negative  Chest Imaging- films reviewed: CXR, 2 view 08/21/2019-elevated left hemidiaphragm, pacemaker in place.  Mild kyphosis with flattened hemidiaphragms most notable posteriorly.  CT chest 01/17/2015-lung parenchyma, no significant mediastinal adenopathy.  Senescent vascular calcification.  HRCT chest 09/27/19- Scattered groundglass opacities and tree-in-bud opacities.  Bilateral basilar linear scars.  Mild bronchiectasis bilateral lower lobes, right middle lobe, right upper lobe, lingula.  Minimal air-trapping.  No significant mediastinal or hilar adenopathy.  Likely small hiatal hernia.  Aortic valve, three-vessel coronary, and aortic calcifications.  Review of Systems neg for any significant sore throat, dysphagia, itching, sneezing, nasal congestion or excess/ purulent secretions, fever, chills, sweats,  unintended wt loss, pleuritic or exertional cp, hempoptysis, orthopnea pnd or change in chronic leg swelling. Also denies presyncope, palpitations, heartburn, abdominal pain, nausea, vomiting, diarrhea or change in bowel or urinary habits, dysuria,hematuria, rash, arthralgias, visual complaints, headache, numbness weakness or ataxia.     Objective:   Physical Exam  Gen. Pleasant, obese, in no distress ENT - no lesions, no post nasal drip Neck: No JVD, no thyromegaly, no carotid bruits Lungs: no use of accessory muscles, no dullness to percussion, decreased without rales or rhonchi  Cardiovascular: Rhythm regular, heart sounds  normal, no murmurs or gallops, no peripheral edema Musculoskeletal: No deformities, no cyanosis or clubbing , no tremors       Assessment & Plan:

## 2020-08-11 ENCOUNTER — Ambulatory Visit (INDEPENDENT_AMBULATORY_CARE_PROVIDER_SITE_OTHER): Payer: No Typology Code available for payment source | Admitting: Pharmacist

## 2020-08-11 DIAGNOSIS — E7800 Pure hypercholesterolemia, unspecified: Secondary | ICD-10-CM

## 2020-08-11 DIAGNOSIS — I10 Essential (primary) hypertension: Secondary | ICD-10-CM

## 2020-08-11 DIAGNOSIS — F339 Major depressive disorder, recurrent, unspecified: Secondary | ICD-10-CM

## 2020-08-11 NOTE — Progress Notes (Signed)
Chronic Care Management Pharmacy  Name: DENNIES COATE           MRN: [id number]        DOB: 13-May-1941  Initial Questions: 1. Have you seen any other providers since your last visit?n/a 2. Any changes in your medicines or health?No   Chief Complaint/ HPI  NAQUAN GARMAN,  80 y.o. , male presents for their follow up CCM visit with the clinical pharmacist via telephone due to COVID-19 Pandemic.  PCP : Dorothyann Peng, NP  Their chronic conditions include: HTN, HLD, GERD, gout, depression, BPH, constipation  Office Visits: -05/15/20 Dorothyann Peng, NP: Patient presented for cough. BP low in office.  -04/22/20 Dorothyann Peng, NP: Patient presented for BPH and dizziness follow up. Prescribed meclizine 25 mg TID PRN.  -03/05/20 Dorothyann Peng, NP: Patient presented for memory loss follow up. Vitamin D was low and recommended supplementation. All other labs WNL. Place referred to PT and order for MRI. Dizziness may be due to vertigo. Referral to weight management for obesity.  -02/28/20 Dorothyann Peng, NP: Patient presented for diarrhea concerns. Recommended to stop magnesium, which may have caused diarrhea and counseled on the brat diet. Patient will start back on Prilosec.  Consult Visit: -05/22/20 Kirk Ruths, MD: Patient presented for CAD follow up. Prescribed aspirin 81 mg and rosuvastatin 20 mg daily.   -03/26/20 Cristopher Peru, MD (cardiology): Patient presented for follow up for pacemaker check and AV block. No medication changes. Follow up in 1 year.  -03/20/20 Levy Pupa: Patient presented for low back pain follow up. Unable to access notes.  -12/13/19 Noemi Chapel, DO (pulmonary): Patient presented for cough and GERD follow up. Patient denies SOB and feels much better. Follow up PRN.  -11/28/19 Alonza Bogus, PA-C (gastro): Patient presented for GERD follow up. GERD is under control if patient doesn't eat after 7pm. Prescribed Reglan 10 mg at night PRN. Follow up in  1 year.  -11/20/19 Wyn Quaker, NP (pulmonary): Patient presented for chronic cough follow up. Recommended adding guaifenesin twice daily. Continue PPI and lifestyle modifications. Follow up in 6 weeks.  -10/30/19 Noemi Chapel, DO (pulmonary): Patient presented in hospital for video bronchoscopy.  4/16 & 10/23/19 Patient presented to the ED for pain in left finger.  Medications: Current Outpatient Medications on File Prior to Visit  Medication Sig Dispense Refill  . allopurinol (ZYLOPRIM) 300 MG tablet Take 1 tablet (300 mg total) by mouth daily. 90 tablet 1  . amLODipine (NORVASC) 10 MG tablet Take 1 tablet (10 mg total) by mouth daily. Please schedule physical exam 90 tablet 0  . aspirin EC 81 MG tablet Take 1 tablet (81 mg total) by mouth daily. Swallow whole. 90 tablet 3  . Calcium Carb-Cholecalciferol (CALCIUM 600 + D PO) Take 1 tablet by mouth daily. 800 units of vitamin D    . docusate sodium (COLACE) 100 MG capsule Take 100 mg by mouth daily.     . meclizine (ANTIVERT) 25 MG tablet Take 1 tablet (25 mg total) by mouth 3 (three) times daily as needed for dizziness. 30 tablet 0  . metoCLOPramide (REGLAN) 10 MG tablet TAKE 1 TABLET (10 MG TOTAL) BY MOUTH AT BEDTIME AS NEEDED FOR NAUSEA. 90 tablet 0  . Multiple Vitamin (MULTIVITAMIN) tablet Take 1 tablet by mouth daily.    [name] Kitchen omeprazole (PRILOSEC) 40 MG capsule Take 1 capsule (40 mg total) by mouth daily. 90 capsule 1  . oxyCODONE-acetaminophen (PERCOCET/ROXICET) 5-325 MG tablet Take 1-2 tablets by mouth  every 6 (six) hours as needed for severe pain. 6 tablet 0  . oxymetazoline (AFRIN) 0.05 % nasal spray Place 1 spray into both nostrils 2 (two) times daily.    . potassium gluconate 595 (99 K) MG TABS tablet Take 595 mg by mouth daily.    [name] Kitchen Respiratory Therapy Supplies (FLUTTER) DEVI 1 each by Does not apply route in the morning and at bedtime. 1 each 0  . rosuvastatin (CRESTOR) 20 MG tablet Take 1 tablet (20 mg total) by mouth daily. 90  tablet 3  . tamsulosin (FLOMAX) 0.4 MG CAPS capsule Take 0.4 mg by mouth daily.    [name] Kitchen tiZANidine (ZANAFLEX) 2 MG tablet Take 1 tablet (2 mg total) by mouth every 8 (eight) hours as needed for muscle spasms. 21 tablet 0  . valsartan-hydrochlorothiazide (DIOVAN-HCT) 320-25 MG tablet Route: Take 1 tablet by mouth daily.  **DUE FOR YEARLY PHYSICAL** 30 tablet 0  . vitamin B-12 (CYANOCOBALAMIN) 1000 MCG tablet Take 1,000 mcg by mouth daily.      No current facility-administered medications on file prior to visit.   Goals Addressed   None    Hypertension   BP goal is:  <140/90  Office blood pressures are  BP Readings from Last 3 Encounters:  08/07/20 122/62  08/02/20 (!) 143/99  06/23/20 120/60   Patient checks BP at home never Patient home BP readings are ranging: n/a  Patient has failed these meds in the past: Bystolic (unknown) Patient is currently controlled on the following medications:   Amlodipine 10 mg 1 tablet daily  Valsartan-HCTZ 320-25 mg 1 tablet daily  We discussed importance of checking blood pressure at home    Plan  Continue current medications     Hyperlipidemia   LDL goal < 70  Last lipids Lab Results  Component Value Date   CHOL 121 03/04/2017   HDL 44.30 03/04/2017   LDLCALC 65 03/04/2017   TRIG 62.0 03/04/2017   CHOLHDL 3 03/04/2017   Hepatic Function Latest Ref Rng & Units 02/07/2019 12/06/2018 03/04/2017  Total Protein 6.5 - 8.1 g/dL 7.2 7.2 6.3  Albumin 3.5 - 5.0 g/dL 3.7 3.8 3.8  AST 15 - 41 U/L 37 25 16  ALT 0 - 44 U/L _0 Alk Phosphatase 38 - 126 U/L 55 48 57  Total Bilirubin 0.3 - 1.2 mg/dL 1.1 0.6 0.6  Bilirubin, Direct 0.0 - 0.3 mg/dL - - 0.1     The ASCVD Risk score (Fowler., et al., 2013) failed to calculate for the following reasons:   The 2013 ASCVD risk score is only valid for ages 11 to 33   Patient has failed these meds in past: none Patient is currently controlled on the following medications:   Rosuvastatin 20  mg 1 tablet at bedtime  We discussed:  diet and exercise extensively  Plan Recommend repeat lipid panel. Continue current medications   ASCVD prevention   Patient has failed these meds in past: none Patient is currently controlled on the following medications:  . Aspirin 81 mg 1 tablet daily  We discussed:  Preference of Tylenol over NSAIDs for pain due to risk of bleeding  Plan  Continue current medications   GERD   Patient has failed these meds in past:  Patient is currently controlled on the following medications:   Omeprazole 40 mg 1 capsule daily  Metoclopramide 10 mg 1 tablet every morning  We discussed:  Non-pharmacologic management of symptoms such as elevating the head  of your bed, avoiding eating 2-3 hours before bed, avoiding triggering foods such as acidic, spicy, or fatty foods, eating smaller meals, and wearing clothes that are loose around the waist; patient reports if he stops eating at 6:30-7 he doesn't have any problems  Plan  Continue current medications   Gout    Lab Results  Component Value Date   LABURIC 4.3 09/26/2013   LABURIC 5.8 02/22/2008   LABURIC 7.2 10/23/2007   No flare ups over past year. Counseled on avoidance of alcohol and low purine diet.   Patient is currently controlled on:   Allopurinol 300 mg 1 tablet daily  Plan Recommend repeat uric acid level. Continue current medications  Depression   Depression screen [facility] 2/9 08/04/2020 03/05/2020 03/24/2018  Decreased Interest 3 1 0  Down, Depressed, Hopeless 3 0 0  PHQ - 2 Score 6 1 0  Altered sleeping 2 - -  Tired, decreased energy 3 - -  Change in appetite 0 - -  Feeling bad or failure about yourself  3 - -  Trouble concentrating 0 - -  Moving slowly or fidgety/restless 1 - -  Suicidal thoughts 0 - -  PHQ-9 Score 15 - -  Difficult doing work/chores Not difficult at all - -  Some recent data might be hidden    Patient has failed these meds in past: bupropion  (dizziness), citalopram, fluoxetine, trazodone Patient is currently controlled on the following medications:   No medications  Plan  Continue without medications.  BPH   PSA  Date Value Ref Range Status  05/14/2019 <0.015 NG/ML  Final  09/05/2017 7.40  Final  10/10/2015 5.41 (H) 0.10 - 4.00 ng/mL Final     Patient has failed these meds in past: none Patient is currently controlled on the following medications:   Tamsulosin 0.4 mg 1 capsule daily  Plan Recommend repeat PSA. Continue current medications   Constipation   Patient has failed these meds in past:  Patient is currently controlled on the following medications:   Docusate 100 mg 1 tablet daily  We discussed: drinking plenty of water and increasing soluble fiber intake (green leafy vegetables, fruit and oatmeal)  Plan  Continue current medications   OTC/vitamins   Patient is currently on the following medications:   Vitamin B12 1000 mcg 1 tablet daily  Calcium & vitamin D 600 mg & 800 units 1 tablet daily  Potassium gluconate 595 mg 1 tablet daily  Multivitamin 1 tablet daily   Plan  Continue current medications  Vaccines   Reviewed and discussed patient's vaccination history.    Immunization History  Administered Date(s) Administered  . Influenza Whole 06/05/2007, 04/30/2008, 03/31/2009  . Influenza, High Dose Seasonal PF 06/19/2014, 07/30/2015, 03/03/2017  . Influenza,inj,quad, With Preservative 04/04/2017  . Moderna Sars-Covid-2 Vaccination 07/03/2020  . PFIZER(Purple Top)SARS-COV-2 Vaccination 08/13/2019, 09/11/2019  . Pneumococcal Conjugate-13 06/19/2014, 07/30/2015  . Pneumococcal Polysaccharide-23 12/11/2003, 03/03/2017  . Td 03/31/2009  . Tdap 10/19/2019  . Zoster Recombinat (Shingrix) 07/17/2020   Patient no longer wishes to get influenza vaccine as his neighbor died from it.  Plan  Recommended patient receive 2nd dose of Shingles and influenza vaccine in office/at  pharmacy.   Medication Management   Patient's preferred pharmacy is:  [facility] [place], [place] [address] Phone: [phone] Fax: [phone]  [facility] [place], [place] [address] Phone:  [phone] Fax: [phone]  [place], [place] [address] Phone: [phone] Fax: [phone]  [id number] [place] [id number]: [phone]: [phone] Uses pill box? No - lined up and takes every morning Pt endorses 100% compliance  We discussed: Current pharmacy is preferred with insurance plan and patient is satisfied with pharmacy services  Plan  Continue current medication management strategy   Follow up: 6 month phone visit  MJeni Salles PharmD BSammamishPharmacist L[phone]

## 2020-08-14 DIAGNOSIS — M961 Postlaminectomy syndrome, not elsewhere classified: Secondary | ICD-10-CM | POA: Diagnosis not present

## 2020-08-17 LAB — CUP PACEART REMOTE DEVICE CHECK
Date Time Interrogation Session: 20220212072048
Implantable Lead Implant Date: 20200129
Implantable Lead Implant Date: 20200129
Implantable Lead Location: 753859
Implantable Lead Location: 753860
Implantable Lead Model: 377
Implantable Lead Model: 377
Implantable Lead Serial Number: 80947537
Implantable Lead Serial Number: 80970966
Implantable Pulse Generator Implant Date: 20200129
Pulse Gen Model: 407145
Pulse Gen Serial Number: 69503797

## 2020-08-18 ENCOUNTER — Ambulatory Visit (INDEPENDENT_AMBULATORY_CARE_PROVIDER_SITE_OTHER): Payer: Medicare Other

## 2020-08-18 DIAGNOSIS — I4430 Unspecified atrioventricular block: Secondary | ICD-10-CM | POA: Diagnosis not present

## 2020-08-21 NOTE — Patient Instructions (Signed)
Visit Information  Goals Addressed            This Visit's Progress   . Pharmacy Care Plan       CARE PLAN ENTRY (see longitudinal plan of care for additional care plan information)  Current Barriers:  . Chronic Disease Management support, education, and care coordination needs related to Hypertension, Hyperlipidemia, GERD, BPH, Gout, and constipation   Hypertension BP Readings from Last 3 Encounters:  08/07/20 122/62  08/02/20 (!) 143/99  06/23/20 120/60   . Pharmacist Clinical Goal(s): o Over the next 180 days, patient will work with PharmD and providers to maintain BP goal <140/90 . Current regimen:  . Amlodipine 10 mg 1 tablet daily . Valsartan-HCTZ 320-25 mg 1 tablet daily . Interventions: o We discussed the importance of checking blood pressure at home while taking blood pressure medicines o We discussed the impact of exercise on blood pressure lowering . Patient self care activities - Over the next 180 days, patient will: o Obtain blood pressure cuff o Check blood pressure weekly, document, and provide at future appointments o Ensure daily salt intake < 2300 mg/day  Hyperlipidemia Lab Results  Component Value Date/Time   LDLCALC 65 03/04/2017 08:08 AM   LDLCALC 65 01/01/2011 04:09 PM   . Pharmacist Clinical Goal(s): o Over the next 180 days, patient will work with PharmD and providers to maintain LDL goal < 70 . Current regimen:  o Simvastatin 20 mg 1 tablet at bedtime . Interventions: o We discussed increasing fiber intake to 10-25 g/day in order to lower cholesterol . Patient self care activities - Over the next 180 days, patient will: o Continue current medications  GERD . Pharmacist Clinical Goal(s): o Over the next 180 days, patient will work with PharmD and providers to manage symptoms of heartburn/indigestion . Current regimen:  . Omeprazole 40 mg 1 capsule daily . Metoclopramide 10 mg 1 tablet every morning . Interventions: o We discussed non  medication related ways to avoid heartburn such as avoiding eating 2 hours before bedtime, elevating the head of your bed, avoiding triggering foods such as spicy or acidic foods . Patient self care activities - Over the next 180 days, patient will: o Continue current medications o Continue to avoid eating after 6:30-7 each night to avoid heartburn  Gout . Pharmacist Clinical Goal(s) o Over the next 180 days, patient will work with PharmD and providers to avoid gout flare ups . Current regimen:  o Allopurinol 300 mg 1 tablet daily . Interventions: o We discussed avoiding alcohol and foods that can trigger gout such as soda, shellfish, organ meats, etc. . Patient self care activities - Over the next 180 days, patient will: o Continue current medications  BPH . Pharmacist Clinical Goal(s) o Over the next 180 days, patient will work with PharmD and providers to manage symptoms of BPH . Current regimen:  o Tamsulosin 0.4 mg 1 capsule daily . Interventions: o We discussed avoiding alcohol and foods that can trigger gout such as soda, shellfish, organ meats, etc. . Patient self care activities - Over the next 180 days, patient will: o Continue current medications  Constipation . Pharmacist Clinical Goal(s) o Over the next 180 days, patient will work with PharmD and providers to avoid constipation . Current regimen:  o Docusate 100 mg 1 tablet daily . Interventions: o We discussed drinking plenty of water and increasing fiber intake through foods such as green leafy vegetables, fruit, and oatmeal . Patient self care activities -  Over the next 180 days, patient will: o Continue current medications o Consider fiber supplement if constipation worsens or unable to get adequate fiber through diet  Medication management . Pharmacist Clinical Goal(s): o Over the next 180 days, patient will work with PharmD and providers to maintain optimal medication adherence . Current pharmacy:  [facility] . Interventions o Comprehensive medication review performed. o Continue current medication management strategy . Patient self care activities - Over the next 180 days, patient will: o Take medications as prescribed o Report any questions or concerns to PharmD and/or provider(s)  Please see past updates related to this goal by clicking on the "Past Updates" button in the selected goal        There are no care plans to display for this patient.    Patient verbalizes understanding of instructions provided today and agrees to view in [place].  Telephone follow up appointment with pharmacy team member scheduled for: 6 months  Viona Gilmore, [facility]

## 2020-08-25 ENCOUNTER — Other Ambulatory Visit: Payer: Self-pay | Admitting: Adult Health

## 2020-08-25 DIAGNOSIS — I10 Essential (primary) hypertension: Secondary | ICD-10-CM

## 2020-08-25 NOTE — Progress Notes (Signed)
Remote pacemaker transmission.   

## 2020-08-27 NOTE — Telephone Encounter (Signed)
Ok for 30 days but needs to get scheduled for CPE

## 2020-08-27 NOTE — Telephone Encounter (Signed)
SENT TO THE PHARMACY BY E-SCRIBE PER CORY.

## 2020-09-08 ENCOUNTER — Encounter: Payer: Self-pay | Admitting: *Deleted

## 2020-09-11 ENCOUNTER — Other Ambulatory Visit: Payer: Self-pay | Admitting: Adult Health

## 2020-09-24 ENCOUNTER — Ambulatory Visit (INDEPENDENT_AMBULATORY_CARE_PROVIDER_SITE_OTHER): Payer: Medicare Other | Admitting: Adult Health

## 2020-09-24 ENCOUNTER — Other Ambulatory Visit: Payer: Self-pay

## 2020-09-24 ENCOUNTER — Encounter: Payer: Self-pay | Admitting: Adult Health

## 2020-09-24 VITALS — BP 148/70 | HR 60 | Temp 98.2°F | Wt 260.4 lb

## 2020-09-24 DIAGNOSIS — R4189 Other symptoms and signs involving cognitive functions and awareness: Secondary | ICD-10-CM

## 2020-09-24 DIAGNOSIS — F5101 Primary insomnia: Secondary | ICD-10-CM | POA: Diagnosis not present

## 2020-09-24 MED ORDER — ESZOPICLONE 2 MG PO TABS: 2.0000 mg | ORAL_TABLET | Freq: Every evening | ORAL | 0 refills | Status: DC | PRN

## 2020-09-24 NOTE — Progress Notes (Signed)
Subjective:    Patient ID: Marcus [name], male    DOB: [birth year], 80 y.o.   MRN: [id number]  HPI  80 year old male who  has a past medical history of Acute meniscal tear of knee (left), Arthritis, Calcaneal spur, Colon cancer ([place]) (1983), Coronary artery disease, Cramps of lower extremity, Depression, Diverticulosis of colon (without mention of hemorrhage), Echocardiogram findings abnormal, without diagnosis (04-23-2010), Edema of lower extremity, Elevated prostate specific antigen (PSA), Esophageal reflux, External hemorrhoids, Frequency of urination, Gout, Hiatal hernia, History of colon cancer (1988), Hyperlipidemia, Lumbar spondylolysis, Malaria (1964), Normal nuclear stress test (04-23-2010), OSA (obstructive sleep apnea), Paraesophageal hernia, Personal history of colonic polyps (08/21/2009), Pneumonia, Postgastric surgery syndromes, Presence of permanent cardiac pacemaker, Prostate cancer ([place]) (2016), Unspecified essential hypertension, Unspecified vitamin D deficiency, Ventral hernia, and Vitamin B12 deficiency.  He presents to the office today for two issues   Insomnia -longstanding issue for the patient.  Going back as far as 2016 he has been tried on Ambien, restoral, and most recently trazodone.  Nothing has worked well for him in the past.  Reports tossing and turning at night, will go to bed around midnight and not be able to fall asleep until around 3 AM.  He may get 3 to 4 hours asleep then he gets up.  Cognitive Impairment -feels as though he is forgetting names more often and its becoming embarrassing for him.  He denies getting lost while driving, misplacing things around the house forgetting appointments.  He did have this complaint earlier this year, in September his MMSE was 29 out of 30.  He had a CT of the head done in November 2021 which showed No evidence of acute large vascular territory infarction, hemorrhage, hydrocephalus, extra-axial collection or mass lesion/mass  effect. Mild, age-appropriate volume loss.  Not able  to perform MRI due to pacemaker.  He is concerned that he is forgetting names especially close family members names and he is wondering if there is anything that can be done.  He is concerned that he may have dementia   Review of Systems See HPI   Past Medical History:  Diagnosis Date  . Acute meniscal tear of knee left  . Arthritis    generalized  . Calcaneal spur   . Colon cancer [facility] 1983   surgical removed. did not receive radiation or chemotherapy.  . Coronary artery disease    dr Stanford Breed  . Cramps of lower extremity    in the mornings occ.  . Depression   . Diverticulosis of colon (without mention of hemorrhage)   . Echocardiogram findings abnormal, without diagnosis 04-23-2010   normal LV function, mod. left atrial enlargement, mild right artrial enlargement and grade 1 diastolic dysfunction  . Edema of lower extremity    ankles, elevates legs  . Elevated prostate specific antigen (PSA)   . Esophageal reflux   . External hemorrhoids   . Frequency of urination    followed by dr dalhstedt  . Gout   . Hiatal hernia   . History of colon cancer 1988  . Hyperlipidemia   . Lumbar spondylolysis   . Malaria 1964  . Normal nuclear stress test 04-23-2010   ef 51%,  septal hypokinesis, normal perfusion  . OSA (obstructive sleep apnea)    does not use CPAP   . Paraesophageal hernia    large  . Personal history of colonic polyps 08/21/2009   TUBULAR ADENOMAS  . Pneumonia    history of; last  episode 2015  . Postgastric surgery syndromes   . Presence of permanent cardiac pacemaker   . Prostate cancer ([place]) 2016  . Unspecified essential hypertension   . Unspecified vitamin D deficiency   . Ventral hernia   . Vitamin B12 deficiency     Social History   Socioeconomic History  . Marital status: Married    Spouse name: Not on file  . Number of children: 4  . Years of education: Not on file  . Highest education  level: Not on file  Occupational History  . Occupation: retired    Fish farm manager: [employer]  Tobacco Use  . Smoking status: Former Smoker    Packs/day: 2.00    Years: 20.00    Pack years: 40.00    Types: Cigarettes    Quit date: 07/05/1974    Years since quitting: 46.2  . Smokeless tobacco: Never Used  Vaping Use  . Vaping Use: Never used  Substance and Sexual Activity  . Alcohol use: Yes    Alcohol/week: 10.0 standard drinks    Types: 10 Glasses of wine per week    Comment: socially; also 1-2 glasses of wine or beer every other day   . Drug use: No  . Sexual activity: Not Currently  Other Topics Concern  . Not on file  Social History Narrative   Recently widowed   Former Smoker    Alcohol use-yes 1-2 drinks per day      Occupation: Retired Midwife      Originally from [place], [place] - in [place] > 10 yrs as of 2017      Social Determinants of Health   Financial Resource Strain: [place]   . Difficulty of Paying Living Expenses: Not hard at all  Food Insecurity: No Food Insecurity  . Worried About Charity fundraiser in the Last Year: Never true  . Ran Out of Food in the Last Year: Never true  Transportation Needs: No Transportation Needs  . Lack of Transportation (Medical): No  . Lack of Transportation (Non-Medical): No  Physical Activity: Inactive  . Days of Exercise per Week: 0 days  . Minutes of Exercise per Session: 0 min  Stress: No Stress Concern Present  . Feeling of Stress : Not at all  Social Connections: Moderately Isolated  . Frequency of Communication with Friends and Family: Three times a week  . Frequency of Social Gatherings with Friends and Family: More than three times a week  . Attends Religious Services: More than 4 times per year  . Active Member of Clubs or Organizations: No  . Attends Archivist Meetings: Never  . Marital Status: Widowed  Intimate Partner Violence: Not At Risk  . Fear of Current or Ex-Partner: No  . Emotionally  Abused: No  . Physically Abused: No  . Sexually Abused: No    Past Surgical History:  Procedure Laterality Date  . BRONCHIAL WASHINGS  10/30/2019   Procedure: BRONCHIAL WASHINGS;  Surgeon: Julian Hy, DO;  Location: [facility];  Service: Endoscopy;;  . CARDIAC CATHETERIZATION    . CATARACT EXTRACTION W/ INTRAOCULAR LENS  IMPLANT, BILATERAL Bilateral   . COLONOSCOPY    . ESOPHAGOGASTRODUODENOSCOPY    . KNEE ARTHROSCOPY Right 2007  . KNEE ARTHROSCOPY  07/13/2011   Procedure: ARTHROSCOPY KNEE;  Surgeon: Cynda Familia;  Location: [facility];  Service: Orthopedics;  Laterality: Left;  partial menisectomy with chondrylplasty  . KNEE SURGERY Left   . LAMINECTOMY AND  MICRODISCECTOMY LUMBAR SPINE  MARCH  2008   L3 -  4  . LAPAROSCOPIC INCISIONAL / UMBILICAL / VENTRAL HERNIA REPAIR  2006  . PACEMAKER IMPLANT N/A 08/02/2018   Procedure: PACEMAKER IMPLANT;  Surgeon: Evans Lance, MD;  Location: [facility];  Service: Cardiovascular;  Laterality: N/A;  . PROSTATE BIOPSY    . RADIOACTIVE SEED IMPLANT N/A 02/12/2019   Procedure: RADIOACTIVE SEED IMPLANT/BRACHYTHERAPY IMPLANT;  Surgeon: Franchot Gallo, MD;  Location: [facility];  Service: Urology;  Laterality: N/A;  90 MINS  . SHOULDER ARTHROSCOPY Right 05-23-2007  . SIGMOID COLECTOMY FOR CANCER  1989  . SPACE OAR INSTILLATION N/A 02/12/2019   Procedure: SPACE OAR INSTILLATION;  Surgeon: Franchot Gallo, MD;  Location: [facility];  Service: Urology;  Laterality: N/A;  . TOTAL KNEE ARTHROPLASTY Left 05/23/2015   Procedure: LEFT TOTAL KNEE ARTHROPLASTY;  Surgeon: Sydnee Cabal, MD;  Location: [facility];  Service: Orthopedics;  Laterality: Left;  [name] Kitchen VERTICAL BANDED GASTROPLASTY  1986  . VIDEO BRONCHOSCOPY N/A 10/30/2019   Procedure: VIDEO BRONCHOSCOPY WITH BRONICAL ALVEROLAR LAVAGE WITHOUT FLUORO;  Surgeon: Julian Hy, DO;  Location: [facility];  Service: Endoscopy;  Laterality: N/A;    Family History  Problem  Relation Age of Onset  . Stroke Paternal Grandfather   . Heart disease Paternal Grandfather   . Esophagitis Father        died from perforated esophagus  . Breast cancer Mother   . Colon cancer Maternal Grandmother        questionable  . Esophageal cancer Neg Hx   . Prostate cancer Neg Hx   . Stomach cancer Neg Hx     Allergies  Allergen Reactions  . Contrast Media [Iodinated Diagnostic Agents] Palpitations    TACHYCARDIA- patient states he has tolerated newer agents since this reaction >30 yrs ago    Current Outpatient Medications on File Prior to Visit  Medication Sig Dispense Refill  . allopurinol (ZYLOPRIM) 300 MG tablet Take 1 tablet (300 mg total) by mouth daily. 90 tablet 1  . amLODipine (NORVASC) 10 MG tablet Take 1 tablet (10 mg total) by mouth daily. Please schedule physical exam 90 tablet 0  . aspirin EC 81 MG tablet Take 1 tablet (81 mg total) by mouth daily. Swallow whole. 90 tablet 3  . Calcium Carb-Cholecalciferol (CALCIUM 600 + D PO) Take 1 tablet by mouth daily. 800 units of vitamin D    . docusate sodium (COLACE) 100 MG capsule Take 100 mg by mouth daily.     . meclizine (ANTIVERT) 25 MG tablet Take 1 tablet (25 mg total) by mouth 3 (three) times daily as needed for dizziness. 30 tablet 0  . metoCLOPramide (REGLAN) 10 MG tablet TAKE 1 TABLET (10 MG TOTAL) BY MOUTH AT BEDTIME AS NEEDED FOR NAUSEA. 90 tablet 0  . Multiple Vitamin (MULTIVITAMIN) tablet Take 1 tablet by mouth daily.    [name] Kitchen omeprazole (PRILOSEC) 40 MG capsule TAKE 1 CAPSULE BY MOUTH EVERY DAY 90 capsule 1  . oxyCODONE-acetaminophen (PERCOCET/ROXICET) 5-325 MG tablet Take 1-2 tablets by mouth every 6 (six) hours as needed for severe pain. 6 tablet 0  . oxymetazoline (AFRIN) 0.05 % nasal spray Place 1 spray into both nostrils 2 (two) times daily.    . potassium gluconate 595 (99 K) MG TABS tablet Take 595 mg by mouth daily.    [name] Kitchen Respiratory Therapy Supplies (FLUTTER) DEVI 1 each by Does not apply route in  the morning and at bedtime. 1 each  0  . tamsulosin (FLOMAX) 0.4 MG CAPS capsule Take 0.4 mg by mouth daily.    [name] Kitchen tiZANidine (ZANAFLEX) 2 MG tablet Take 1 tablet (2 mg total) by mouth every 8 (eight) hours as needed for muscle spasms. 21 tablet 0  . valsartan-hydrochlorothiazide (DIOVAN-HCT) 320-25 MG tablet TAKE 1 TABLET BY MOUTH DAILY. **DUE FOR YEARLY PHYSICAL** 30 tablet 0  . vitamin B-12 (CYANOCOBALAMIN) 1000 MCG tablet Take 1,000 mcg by mouth daily.     . rosuvastatin (CRESTOR) 20 MG tablet Take 1 tablet (20 mg total) by mouth daily. 90 tablet 3   No current facility-administered medications on file prior to visit.    BP (!) 148/70 (BP Location: Left Arm, Patient Position: Sitting, Cuff Size: Normal)   Pulse 60   Temp 98.2 F (36.8 C) (Oral)   Wt 260 lb 6.4 oz (118.1 kg)   SpO2 97%   BMI 39.59 kg/m       Objective:   Physical Exam Vitals and nursing note reviewed.  Constitutional:      Appearance: Normal appearance.  Cardiovascular:     Rate and Rhythm: Normal rate and regular rhythm.     Pulses: Normal pulses.     Heart sounds: Normal heart sounds.  Pulmonary:     Effort: Pulmonary effort is normal.     Breath sounds: Normal breath sounds.  Musculoskeletal:        General: Normal range of motion.  Skin:    General: Skin is warm and dry.  Neurological:     General: No focal deficit present.     Mental Status: He is alert and oriented to person, place, and time.  Psychiatric:        Mood and Affect: Mood normal.        Behavior: Behavior normal.        Thought Content: Thought content normal.        Judgment: Judgment normal.           Assessment & Plan:  1. Primary insomnia -We will trial Lunesta 2 mg nightly.  He will follow-up if there is no improvement in his sleep pattern - eszopiclone (LUNESTA) 2 MG TABS tablet; Take 1 tablet (2 mg total) by mouth at bedtime as needed for sleep. Take immediately before bedtime  Dispense: 30 tablet; Refill: 0  2.  Cognitive impairment -Cognitive deficits during this exam.  Explained normal age-related volume loss that was seen on CT.  Will refer to neuropsych for more formal testing. - Ambulatory referral to Neurology  Dorothyann Peng, NP

## 2020-09-25 ENCOUNTER — Other Ambulatory Visit: Payer: Self-pay | Admitting: Adult Health

## 2020-09-25 ENCOUNTER — Encounter: Payer: Self-pay | Admitting: Psychology

## 2020-09-25 DIAGNOSIS — I10 Essential (primary) hypertension: Secondary | ICD-10-CM

## 2020-10-23 ENCOUNTER — Other Ambulatory Visit: Payer: Self-pay | Admitting: Adult Health

## 2020-10-23 DIAGNOSIS — I10 Essential (primary) hypertension: Secondary | ICD-10-CM

## 2020-10-29 ENCOUNTER — Other Ambulatory Visit: Payer: Self-pay | Admitting: Adult Health

## 2020-10-30 ENCOUNTER — Other Ambulatory Visit: Payer: Self-pay

## 2020-10-30 ENCOUNTER — Ambulatory Visit (INDEPENDENT_AMBULATORY_CARE_PROVIDER_SITE_OTHER): Payer: Medicare Other | Admitting: Adult Health

## 2020-10-30 ENCOUNTER — Encounter: Payer: Self-pay | Admitting: Adult Health

## 2020-10-30 VITALS — BP 142/80 | HR 60 | Temp 97.6°F | Wt 255.8 lb

## 2020-10-30 DIAGNOSIS — T22212A Burn of second degree of left forearm, initial encounter: Secondary | ICD-10-CM | POA: Diagnosis not present
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
## 2020-11-17 ENCOUNTER — Ambulatory Visit (INDEPENDENT_AMBULATORY_CARE_PROVIDER_SITE_OTHER): Payer: Medicare Other

## 2020-11-17 DIAGNOSIS — Z950 Presence of cardiac pacemaker: Secondary | ICD-10-CM

## 2020-11-17 DIAGNOSIS — I4430 Unspecified atrioventricular block: Secondary | ICD-10-CM

## 2020-11-18 LAB — CUP PACEART REMOTE DEVICE CHECK
Date Time Interrogation Session: 20220516164516
Implantable Lead Implant Date: 20200129
Implantable Lead Implant Date: 20200129
Implantable Lead Location: 753859
Implantable Lead Location: 753860
Implantable Lead Model: 377
Implantable Lead Model: 377
Implantable Lead Serial Number: 80947537
Implantable Lead Serial Number: 80970966
Implantable Pulse Generator Implant Date: 20200129
Pulse Gen Model: 407145
Pulse Gen Serial Number: 69503797

## 2020-11-20 ENCOUNTER — Telehealth: Payer: Self-pay | Admitting: Pharmacist
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
## 2020-11-24 ENCOUNTER — Other Ambulatory Visit: Payer: Self-pay | Admitting: Adult Health

## 2020-11-24 DIAGNOSIS — I10 Essential (primary) hypertension: Secondary | ICD-10-CM

## 2020-12-09 ENCOUNTER — Telehealth: Payer: Medicare Other | Admitting: Adult Health

## 2020-12-09 ENCOUNTER — Telehealth: Payer: Self-pay | Admitting: Adult Health

## 2020-12-09 NOTE — Telephone Encounter (Signed)
Pt has been rescheduled. 

## 2020-12-09 NOTE — Telephone Encounter (Signed)
Pt called the office back and stated that he was unable to answer the phone due to him being in the bathroom throwing up.  Pt declined to r/s appointment and asked if someone could give him a call back.  Pt is aware that his appointment is over.

## 2020-12-10 ENCOUNTER — Telehealth (INDEPENDENT_AMBULATORY_CARE_PROVIDER_SITE_OTHER): Payer: Medicare Other | Admitting: Adult Health

## 2020-12-10 ENCOUNTER — Encounter: Payer: Self-pay | Admitting: Adult Health

## 2020-12-10 ENCOUNTER — Other Ambulatory Visit: Payer: Self-pay

## 2020-12-10 ENCOUNTER — Telehealth: Payer: Medicare Other | Admitting: Adult Health

## 2020-12-10 VITALS — Ht 68.0 in | Wt 245.0 lb

## 2020-12-10 DIAGNOSIS — M545 Low back pain, unspecified: Secondary | ICD-10-CM

## 2020-12-10 NOTE — Progress Notes (Signed)
Remote pacemaker transmission.   

## 2020-12-10 NOTE — Progress Notes (Signed)
Virtual Visit via Telephone Note  I connected with Marcus [name] on 12/10/20 at 10:30 AM EDT by telephone and verified that I am speaking with the correct person using two identifiers.   I discussed the limitations, risks, security and privacy concerns of performing an evaluation and management service by telephone and the availability of in person appointments. I also discussed with the patient that there may be a patient responsible charge related to this service. The patient expressed understanding and agreed to proceed.  Location patient: home Location provider: work or home office Participants present for the call: patient, provider Patient did not have a visit in the prior 7 days to address this/these issue(s).   History of Present Illness: 80 year old male with history of chronic back pain.  Currently being seen at [facility] and receives steroid injections from time to time in his low back by Dr. Herma Mering.  Has had some leg length discrepancy in the past but feels as though one of his legs become shorter as the years go on- causing him to limp and possibly causing worsening back pain  He is wondering if there is anything that can be done for this.  CT from 07/2020 of lumbar spine showed IMPRESSION: Multilevel disc and facet degeneration throughout the lumbar spine. Multilevel stenosis as described above. No acute findings. No change since the prior MRI 2015.   Observations/Objective: Patient sounds cheerful and well on the phone. I do not appreciate any SOB. Speech and thought processing are grossly intact. Patient reported vitals:  Assessment and Plan: 1. Lumbar spine pain -Advised to follow-up with orthopedics as they would be able to answer this question better than I can.  Follow Up Instructions:   I did not refer this patient for an OV in the next 24 hours for this/these issue(s).  I discussed the assessment and treatment plan with the patient. The patient was  provided an opportunity to ask questions and all were answered. The patient agreed with the plan and demonstrated an understanding of the instructions.   The patient was advised to call back or seek an in-person evaluation if the symptoms worsen or if the condition fails to improve as anticipated.  I provided 10 minutes of non-face-to-face time during this encounter.   Dorothyann Peng, NP

## 2020-12-22 DIAGNOSIS — M5416 Radiculopathy, lumbar region: Secondary | ICD-10-CM | POA: Diagnosis not present

## 2020-12-25 ENCOUNTER — Encounter: Payer: Self-pay | Admitting: Psychology

## 2020-12-25 ENCOUNTER — Other Ambulatory Visit: Payer: Self-pay

## 2020-12-25 ENCOUNTER — Ambulatory Visit (INDEPENDENT_AMBULATORY_CARE_PROVIDER_SITE_OTHER): Payer: Medicare Other | Admitting: Psychology

## 2020-12-25 ENCOUNTER — Ambulatory Visit: Payer: Medicare Other | Admitting: Psychology

## 2020-12-25 DIAGNOSIS — F332 Major depressive disorder, recurrent severe without psychotic features: Secondary | ICD-10-CM | POA: Diagnosis not present

## 2020-12-25 DIAGNOSIS — Z8546 Personal history of malignant neoplasm of prostate: Secondary | ICD-10-CM | POA: Insufficient documentation

## 2020-12-25 DIAGNOSIS — E538 Deficiency of other specified B group vitamins: Secondary | ICD-10-CM | POA: Insufficient documentation

## 2020-12-25 DIAGNOSIS — R4189 Other symptoms and signs involving cognitive functions and awareness: Secondary | ICD-10-CM

## 2020-12-25 DIAGNOSIS — I2510 Atherosclerotic heart disease of native coronary artery without angina pectoris: Secondary | ICD-10-CM | POA: Insufficient documentation

## 2020-12-25 DIAGNOSIS — F411 Generalized anxiety disorder: Secondary | ICD-10-CM

## 2020-12-25 DIAGNOSIS — Z7289 Other problems related to lifestyle: Secondary | ICD-10-CM

## 2020-12-25 DIAGNOSIS — H04123 Dry eye syndrome of bilateral lacrimal glands: Secondary | ICD-10-CM | POA: Insufficient documentation

## 2020-12-25 DIAGNOSIS — Z789 Other specified health status: Secondary | ICD-10-CM

## 2020-12-25 DIAGNOSIS — H903 Sensorineural hearing loss, bilateral: Secondary | ICD-10-CM | POA: Insufficient documentation

## 2020-12-25 NOTE — Progress Notes (Signed)
   Psychometrician Note   Cognitive testing was administered to Marcus [name] by Milana Kidney, B.S. (psychometrist) under the supervision of Dr. Christia Reading, Ph.D., licensed psychologist on 12/25/20. Marcus [name] did not appear overtly distressed by the testing session per behavioral observation or responses across self-report questionnaires. Rest breaks were offered.    The battery of tests administered was selected by Dr. Christia Reading, Ph.D. with consideration to Marcus [name] current level of functioning, the nature of his symptoms, emotional and behavioral responses during interview, level of literacy, observed level of motivation/effort, and the nature of the referral question. This battery was communicated to the psychometrist. Communication between Dr. Christia Reading, Ph.D. and the psychometrist was ongoing throughout the evaluation and Dr. Christia Reading, Ph.D. was immediately accessible at all times. Dr. Christia Reading, Ph.D. provided supervision to the psychometrist on the date of this service to the extent necessary to assure the quality of all services provided.    Marcus [name] will return within approximately 1-2 weeks for an interactive feedback session with Marcus [name] at which time his test performances, clinical impressions, and treatment recommendations will be reviewed in detail. Marcus [name] understands he can contact our office should he require our assistance before this time.  A total of 130 minutes of billable time were spent face-to-face with Marcus [name] by the psychometrist. This includes both test administration and scoring time. Billing for these services is reflected in the clinical report generated by Dr. Christia Reading, Ph.D.  This note reflects time spent with the psychometrician and does not include test scores or any clinical interpretations made by Marcus [name]. The full report will follow in a separate note.

## 2020-12-25 NOTE — Progress Notes (Signed)
NEUROPSYCHOLOGICAL EVALUATION [name]. [facility] of Neurology  Date of Evaluation: December 25, 2020  Reason for Referral:   Marcus [name] is a 80 y.o. right-handed Caucasian male referred by  Dorothyann Peng, NP , to characterize his current cognitive functioning and assist with diagnostic clarity and treatment planning in the context of subjective cognitive decline, particularly surrounding short-term memory.   Assessment and Plan:   Clinical Impression(s): Marcus [name] pattern of performance is suggestive of an isolated impairment copying a complex figure, as well as some variability across retrieval/consolidation aspects of verbal and visual memory.The former was likely caused by poor attention to detail (i.e., Marcus [name] omitted several internal aspects of the figure) rather than notable visuospatial distortions or abnormalities. All other assessed domains were consistently appropriate. This included processing speed, attention/concentration, executive functioning, safety/judgment, receptive and expressive language, all other tasks assessing visuospatial abilities, and encoding (i.e., learning) aspects of memory. Marcus [name] denied difficulties completing instrumental activities of daily living (ADLs) independently.  Across mood-related questionnaires, Marcus [name] responses suggested rates of both anxiety and depression in the severe range. He also reported moderate sleep dysfunction, likely related to his history of obstructive sleep apnea and significant symptoms of reflux which directly impact his ability to fall and remain asleep. It is also worth highlighting that Marcus [name] reported high rates of alcohol consumption (i.e., 4-5 glasses of wine or 2-3 beers on a near nightly basis). All of these variables, whether in isolation or combined, will directly impact cognitive functioning and can potentially explain variability across memory testing, as well as  subjective difficulties in his day-to-day life. His overall performance is not fully consistent with typical patterns of Alzheimer's disease or other neurodegenerative condition at the present time and it seems premature to assume an underlying neurological cause. However, continued medical monitoring will be important moving forward.   Recommendations: A repeat neuropsychological evaluation in 18-24 months (or sooner if functional decline is noted) is recommended to assess the trajectory of future cognitive decline should it occur. This will also aid in future efforts towards improved diagnostic clarity.  A combination of medication and psychotherapy has been shown to be most effective at treating symptoms of anxiety and depression. As such, [name] is encouraged to speak with his prescribing physician regarding medication adjustments to optimally manage these symptoms. [name] could also consider engaging in short-term psychotherapy to address symptoms of psychiatric distress. He would benefit from an active and collaborative therapeutic environment, rather than one purely supportive in nature. Recommended treatment modalities include Cognitive Behavioral Therapy (CBT) or Acceptance and Commitment Therapy (ACT).  The CDC defines heavy drinking for men as consuming 15 or more drinks per week. Per Marcus [name] estimates, he is likely exceeding this amount by a fair amount. Chronic heavy drinking can impair cognitive functioning and lead to alcohol-related dementia conditions. I would strongly encourage him to significantly curb his alcohol intake.  Regarding sleep, Marcus [name] reported using his CPAP machine nightly. This behavior is strongly encouraged. Poorly treated sleep apnea can create cognitive dysfunction and will also increase his risk for stroke, heart attack, and future cognitive decline. I would also encourage him to talk with his PCP regarding ways to address symptoms of reflux which seem  to be directly responsible for a notable portion of ongoing sleep dysfunction.    When learning new information, he would benefit from information being broken up into small, manageable pieces. He may also find it helpful to articulate  the material in his own words and in a context to promote encoding at the onset of a new task. This material may need to be repeated multiple times to promote encoding.  Memory can be improved using internal strategies such as rehearsal, repetition, chunking, mnemonics, association, and imagery. External strategies such as written notes in a consistently used memory journal, visual and nonverbal auditory cues such as a calendar on the refrigerator or appointments with alarm, such as on a cell phone, can also help maximize recall.    To address problems with fluctuating attention, he may wish to consider:   -Avoiding external distractions when needing to concentrate   -Limiting exposure to fast paced environments with multiple sensory demands   -Writing down complicated information and using checklists   -Attempting and completing one task at a time (i.e., no multi-tasking)   -Verbalizing aloud each step of a task to maintain focus   -Reducing the amount of information considered at one time  Review of Records:   [name] was seen by [facility] Dorothyann Peng, NP) on 09/24/2020 for concerns surrounding insomnia and subjective cognitive decline. The former was said to be a longstanding concern and that Marcus [name] had been trialed on several medications in the past without much success. Regarding the latter, Marcus [name] reported his perception that he has been forgetting names to the extent where it is embarrassing for him. ADLs were described as intact. Performance on a brief cognitive screening instrument (MMSE) was 29/30 in September 2021. Ultimately, Marcus [name] was referred for a comprehensive neuropsychological evaluation to characterize his cognitive  abilities and to assist with diagnostic clarity and treatment planning.   Head CT on 05/27/2020 revealed mild age-appropriate volume loss but no acute intracranial abnormalities. Brain MRI was unable to be performed due to his pacemaker.   Past Medical History:  Diagnosis Date   Acute meniscal tear of knee left   Adjustment disorder with depressed mood 2019   related to prior passing of his wife   Arthritis    generalized   AV block 08/01/2018   Benign essential hypertension 01/18/2007   Bradycardia 01/08/2011   Bronchiectasis with acute exacerbation 06/23/2020   Calcaneal spur    Chest pain, atypical 08/23/2014   Chronic cough 10/07/2014   Coronary artery disease    Cramps of lower extremity    in the mornings, occasionally   Degeneration of lumbar intervertebral disc 10/14/2017   Diverticulosis of colon 07/07/2005   Dry eye syndrome of bilateral lacrimal glands    Echocardiogram findings abnormal, without diagnosis 04/23/2010   normal LV function, mod. left atrial enlargement, mild right artrial enlargement and grade 1 diastolic dysfunction   Edema of lower extremity    ankles, elevates legs   Elevated PSA 06/19/2014   Frequency of urination    followed by dr dalhstedt   GERD (gastroesophageal reflux disease) 01/18/2007   Gout, unspecified 01/18/2007   Hemorrhoids 01/19/2011   Hiatal hernia    History of colonic polyps 01/18/2007   History of malignant neoplasm of colon 1988   surgically removed; no radiation or chemotherapy   History of malignant neoplasm of prostate    Hyperlipidemia    Insomnia, unspecified 08/21/2007   Iron deficiency anemia, unspecified  01/19/2011   Lumbar post-laminectomy syndrome 10/14/2017   Lumbar spondylolysis    Lumbar strain 12/14/2011   Malaria 1964   Normal nuclear stress test 04/23/2010   ef 51%,  septal hypokinesis, normal perfusion   Obstructive sleep  apnea 01/18/2007   uses CPAP nightly   Paraesophageal hernia    large    Personal history of colonic polyps 08/21/2009   TUBULAR ADENOMAS   Pneumonia    history of; last episode 2015   Postgastric surgery syndromes    Presence of permanent cardiac pacemaker    Primary osteoarthritis of knee 05/23/2015   Prostate cancer ([place]) 2016   S/P knee replacement 05/23/2015   Sensorineural hearing loss, bilateral    Skin lesion of face 07/23/2011   Ventral hernia    Vitamin B12 deficiency    Vitamin D deficiency 10/28/2009    Past Surgical History:  Procedure Laterality Date   BRONCHIAL WASHINGS  10/30/2019   Procedure: BRONCHIAL WASHINGS;  Surgeon: Julian Hy, DO;  Location: [facility];  Service: Endoscopy;;   CARDIAC CATHETERIZATION     CATARACT EXTRACTION W/ INTRAOCULAR LENS  IMPLANT, BILATERAL Bilateral    COLONOSCOPY     ESOPHAGOGASTRODUODENOSCOPY     KNEE ARTHROSCOPY Right 2007   KNEE ARTHROSCOPY  07/13/2011   Procedure: ARTHROSCOPY KNEE;  Surgeon: Cynda Familia;  Location: [place];  Service: Orthopedics;  Laterality: Left;  partial menisectomy with chondrylplasty   KNEE SURGERY Left    LAMINECTOMY AND MICRODISCECTOMY LUMBAR SPINE  MARCH  2008   L3 -  4   LAPAROSCOPIC INCISIONAL / UMBILICAL / VENTRAL HERNIA REPAIR  2006   PACEMAKER IMPLANT N/A 08/02/2018   Procedure: PACEMAKER IMPLANT;  Surgeon: Evans Lance, MD;  Location: [place] [facility];  Service: Cardiovascular;  Laterality: N/A;   PROSTATE BIOPSY     RADIOACTIVE SEED IMPLANT N/A 02/12/2019   Procedure: RADIOACTIVE SEED IMPLANT/BRACHYTHERAPY IMPLANT;  Surgeon: Franchot Gallo, MD;  Location: [facility];  Service: Urology;  Laterality: N/A;  90 MINS   SHOULDER ARTHROSCOPY Right 05-23-2007   SIGMOID COLECTOMY FOR CANCER  1989   SPACE OAR INSTILLATION N/A 02/12/2019   Procedure: SPACE OAR INSTILLATION;  Surgeon: Franchot Gallo, MD;  Location: [facility];  Service: Urology;  Laterality: N/A;   TOTAL KNEE ARTHROPLASTY Left 05/23/2015   Procedure: LEFT TOTAL KNEE  ARTHROPLASTY;  Surgeon: Sydnee Cabal, MD;  Location: [facility];  Service: Orthopedics;  Laterality: Left;   VERTICAL BANDED GASTROPLASTY  1986   VIDEO BRONCHOSCOPY N/A 10/30/2019   Procedure: VIDEO BRONCHOSCOPY WITH BRONICAL ALVEROLAR LAVAGE WITHOUT FLUORO;  Surgeon: Julian Hy, DO;  Location: [facility];  Service: Endoscopy;  Laterality: N/A;    Current Outpatient Medications:    allopurinol (ZYLOPRIM) 300 MG tablet, Take 1 tablet (300 mg total) by mouth daily., Disp: 90 tablet, Rfl: 1   amLODipine (NORVASC) 10 MG tablet, TAKE 1 TABLET (10 MG TOTAL) BY MOUTH DAILY. PLEASE SCHEDULE PHYSICAL EXAM, Disp: 90 tablet, Rfl: 0   aspirin EC 81 MG tablet, Take 1 tablet (81 mg total) by mouth daily. Swallow whole., Disp: 90 tablet, Rfl: 3   Calcium Carb-Cholecalciferol (CALCIUM 600 + D PO), Take 1 tablet by mouth daily. 800 units of vitamin D, Disp: , Rfl:    docusate sodium (COLACE) 100 MG capsule, Take 100 mg by mouth daily. , Disp: , Rfl:    eszopiclone (LUNESTA) 2 MG TABS tablet, Take 1 tablet (2 mg total) by mouth at bedtime as needed for sleep. Take immediately before bedtime, Disp: 30 tablet, Rfl: 0   HYDROcodone-acetaminophen (NORCO/VICODIN) 5-325 MG tablet, hydrocodone 5 mg-acetaminophen 325 mg tablet  Take 1 tablet 4 times a day by oral route as needed., Disp: , Rfl:  meclizine (ANTIVERT) 25 MG tablet, Take 1 tablet (25 mg total) by mouth 3 (three) times daily as needed for dizziness., Disp: 30 tablet, Rfl: 0   metoCLOPramide (REGLAN) 10 MG tablet, TAKE 1 TABLET (10 MG TOTAL) BY MOUTH AT BEDTIME AS NEEDED FOR NAUSEA., Disp: 90 tablet, Rfl: 0   Multiple Vitamin (MULTIVITAMIN) tablet, Take 1 tablet by mouth daily., Disp: , Rfl:    omeprazole (PRILOSEC) 40 MG capsule, TAKE 1 CAPSULE BY MOUTH EVERY DAY, Disp: 90 capsule, Rfl: 1   oxyCODONE-acetaminophen (PERCOCET/ROXICET) 5-325 MG tablet, Take 1-2 tablets by mouth every 6 (six) hours as needed for severe pain., Disp: 6 tablet, Rfl: 0    oxymetazoline (AFRIN) 0.05 % nasal spray, Place 1 spray into both nostrils 2 (two) times daily., Disp: , Rfl:    potassium gluconate 595 (99 K) MG TABS tablet, Take 595 mg by mouth daily., Disp: , Rfl:    Respiratory Therapy Supplies (FLUTTER) DEVI, 1 each by Does not apply route in the morning and at bedtime., Disp: 1 each, Rfl: 0   rosuvastatin (CRESTOR) 20 MG tablet, Take 1 tablet (20 mg total) by mouth daily., Disp: 90 tablet, Rfl: 3   tamsulosin (FLOMAX) 0.4 MG CAPS capsule, Take 0.4 mg by mouth daily., Disp: , Rfl:    tiZANidine (ZANAFLEX) 2 MG tablet, Take 1 tablet (2 mg total) by mouth every 8 (eight) hours as needed for muscle spasms., Disp: 21 tablet, Rfl: 0   traMADol (ULTRAM) 50 MG tablet, tramadol 50 mg tablet  Take 1 tablet every 6 hours by oral route as needed., Disp: , Rfl:    valsartan-hydrochlorothiazide (DIOVAN-HCT) 320-25 MG tablet, TAKE 1 TABLET BY MOUTH DAILY. **DUE FOR YEARLY PHYSICAL**, Disp: 90 tablet, Rfl: 1   vitamin B-12 (CYANOCOBALAMIN) 1000 MCG tablet, Take 1,000 mcg by mouth daily. , Disp: , Rfl:   Clinical Interview:   The following information was obtained during a clinical interview with Mr. Proby prior to cognitive testing.  Cognitive Symptoms: Decreased short-term memory: Endorsed. Consistent with Mr. Faustino Congress notes, Mr. Hayter reported primary concerns surrounding trouble coming up with names, even those for close family members. He noted that this difficulty had been "building" (i.e., gradually worsening) over the past several years. He added that he has a tendency to forget his phone inside his car but generally denied other specific memory complaints surrounding misplacing/losing things or trouble recalling prior conversations.  Decreased long-term memory: Denied. Decreased attention/concentration: Denied. Reduced processing speed: Denied. Difficulties with executive functions: Largely denied. However, he did note some changes in his decision making  process where he may be somewhat impulsive and think things through less than what was once typical for him. None of these decisions have created any sort of difficulty for him and impulsive purchases generally surround things that end up being needed. No personality changes were noted.  Difficulties with emotion regulation: Denied. Difficulties with receptive language: Denied. Difficulties with word finding: Endorsed. In addition to names, he described some difficulty recalling locations as well.  Decreased visuoperceptual ability: Denied.  Difficulties completing ADLs: Denied.  Additional Medical History: History of traumatic brain injury/concussion: Denied. History of stroke: Denied. History of seizure activity: Denied. History of known exposure to toxins: Denied. Symptoms of chronic pain: Endorsed. He reported diffuse chronic pain related to symptoms of arthritis. Particular areas of emphasis included his lower back and knees.  Experience of frequent headaches/migraines: Denied. Frequent instances of dizziness/vertigo: Denied.  Sensory changes: He utilizes reading glasses with positive effect. 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 He utilizes hearing aids with benefit but continues to report significant tinnitus. Other sensory changes/difficulties (e.g., taste or smell) were denied.  Balance/coordination difficulties: Endorsed. He noted that his right leg was mildly shorter than his left which could be contributing to generally mild balance instability. Outside of this and back pain, he was unclear as to what could be contributing to balance concerns. He denied any recent falls.  Other motor difficulties: Denied.  Sleep History: Estimated hours obtained each night: 4-5 hours. He described his sleep as "terrible."  Difficulties falling asleep: Endorsed. As medical records suggest, there is a history of  insomnia with prior largely unsuccessful medication intervention. Mr. Mineer attributed much of difficulties falling asleep to significant tinnitus.  Difficulties staying asleep: Endorsed. He will often wake after 2-3 hours and experience notable difficulties falling back asleep. Much of this was due to symptoms of reflux/regurgitation which causes significant coughing spells which keep him awake.  Feels rested and refreshed upon awakening: Denied.  History of snoring: Endorsed. History of waking up gasping for air: Endorsed. Witnessed breath cessation while asleep: Endorsed. He acknowledged a history of obstructive sleep apnea and reported utilizing his CPAP machine nightly.   History of vivid dreaming: Denied. Excessive movement while asleep: Denied. Instances of acting out his dreams: Denied.  Psychiatric/Behavioral Health History: Depression: He described his current mood as "terrible." He stated that his wife of 60 years passed away approximately two years ago and that the second anniversary of this event was approaching. Prior to his wife passing, he reported no symptoms of depression or mental health concerns in general. Current or remote suicidal ideation, intent, or plan was denied.  Anxiety: Denied. Mania: Denied. Trauma History: Denied. Visual/auditory hallucinations: Denied. Delusional thoughts: Denied.  Tobacco: Denied. Alcohol: Nearly every evening, he reported sitting in his backyard garden, listening to music. While doing this, he reported consuming either 4-5 glasses of wine or 2-3 beers ("never both"). He denied a history of problematic alcohol abuse or dependence.  Recreational drugs: Denied. Caffeine: He reported consuming 1-2 cups of coffee in the mornings.   Family History: Problem Relation Age of Onset   Stroke Paternal Grandfather    Heart disease Paternal Grandfather    Esophagitis Father        died from perforated esophagus   Breast cancer Mother    Colon  cancer Maternal Grandmother        questionable   Esophageal cancer Neg Hx    Prostate cancer Neg Hx    Stomach cancer Neg Hx    This information was confirmed by Mr. Hutzler.  Academic/Vocational History: Highest level of educational attainment: 13 years. He graduated from high school and completed one additional year of college. 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 English-based courses represented a likely relative weakness.  History of developmental delay: Denied. History of grade repetition: Denied. Enrollment in special education courses: Denied. History of LD/ADHD: Denied.  Employment: Retired. 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   Evaluation Results:   Behavioral Observations: Mr. Chalker was unaccompanied, arrived to his appointment on time, and was appropriately dressed and groomed. He appeared alert and oriented. He did exhibit some balance instability and ambulated effectively with the assistance of a cane. Gross motor functioning appeared  intact upon informal observation and no abnormal movements (e.g., tremors) were noted. His affect was somewhat flat, but did range a bit given the subject being discussed during interview. Spontaneous speech was fluent and word finding difficulties were not observed during the clinical interview. Thought processes were coherent, organized, and normal in content. Insight into his cognitive difficulties appeared adequate. During testing, sustained attention was appropriate. Task engagement was adequate and he persisted when challenged. Overall, Mr. Coia was cooperative with the clinical interview and subsequent testing procedures.   Adequacy of Effort: The validity of neuropsychological testing is limited by the extent to which the individual being tested may be assumed to have exerted adequate effort during  testing. Mr. Current expressed his intention to perform to the best of his abilities and exhibited adequate task engagement and persistence. Scores across stand-alone and embedded performance validity measures were within expectation. As such, the results of the current evaluation are believed to be a valid representation of Mr. Malloy current cognitive functioning.  Test Results: Mr. Quinley was fully oriented at the time of the current evaluation.  Intellectual abilities based upon educational and vocational attainment were estimated to be in the average range. Premorbid abilities were estimated to be within the average range based upon a single-word reading test.   Processing speed was mildly variable but overall appropriate, ranging from the below average to above average normative ranges. Basic attention was above average to exceptionally high. More complex attention (e.g., working memory) was average. Executive functioning was average to above average. He also performed in the well above average range across a task assessing safety and judgment.  Assessed receptive language abilities were above average. Likewise, Mr. Slabach did not exhibit any difficulties comprehending task instructions and answered all questions asked of him appropriately. Assessed expressive language (e.g., verbal fluency and confrontation naming) was above average to well above average.     Assessed visuospatial/visuoconstructional abilities were average to above average outside of his copy of a complex figure. Points were lost on his drawing of a clock due to very subtle numerical spacing errors, as well as no size differentiation between clock hands. Points were lost on his copy of a complex figure due to poor attention to detail rather than notable visual distortions or abnormalities. He omitted several internal aspects of the figure and completed his copy quite quickly.   Learning (i.e., encoding) of novel verbal  information was below average to average. He did exhibit an abnormal learning curve across a list of words where he progressively worsened rather than improved (i.e., 6, 6, 4, 3). However, this may have been influenced by some increasing frustration with the task itself. Spontaneous delayed recall (i.e., retrieval) of previously learned information was below average. Retention rates were 83% across a story learning task, 33% (raw score of 2) across a list learning task, and 62% across a figure drawing task. Performance across recognition tasks was exceptionally low across a visual task but well below average to average across verbal tasks, suggesting some evidence for information consolidation.   Results of emotional screening instruments suggested that recent symptoms of generalized anxiety were in the severe range, while symptoms of depression were also within the severe range. A screening instrument assessing recent sleep quality suggested the presence of moderate sleep dysfunction.  Tables of Scores:   Note: This summary of test scores accompanies the interpretive report and should not be considered in isolation without reference to the appropriate sections in the text. Descriptors are based on  appropriate normative data and may be adjusted based on clinical judgment. The terms "impaired" and "within normal limits (WNL)" are used when a more specific level of functioning cannot be determined.       Validity Testing:   DESCRIPTOR       Dot Counting Test: --- --- Within Expectation  RBANS Effort Index: --- --- Within Expectation  WAIS-IV Reliable Digit Span: --- --- Within Expectation  D-KEFS Color Word Effort Index: --- --- Within Expectation       Orientation:      Raw Score Percentile   NAB Orientation, Form 1 29/29 --- ---       Cognitive Screening:           Raw Score Percentile   SLUMS: 27/30 --- ---       RBANS, Form A: Standard Score/ Scaled Score Percentile   Total Score 92 30  Average  Immediate Memory 87 19 Below Average    List Learning 7 16 Below Average    Story Memory 9 37 Average  Visuospatial/Constructional 92 30 Average    Figure Copy 4 2 Well Below Average    Line Orientation 19/20 >75 Above Average  Language 110 75 Above Average    Picture Naming 10/10 >75 Above Average    Semantic Fluency 12 75 Above Average  Attention 109 73 Average    Digit Span 16 98 Exceptionally High    Coding 7 16 Below Average  Delayed Memory 75 5 Well Below Average    List Recall 2/10 17-25 Below Average to Average    List Recognition 16/20 3-9 Well Below Average    Story Recall 7 16 Below Average    Story Recognition 10/12 53-67 Average    Figure Recall 7 16 Below Average    Figure Recognition 1/8 <1 Exceptionally Low       Intellectual Functioning:           Standard Score Percentile   Test of Premorbid Functioning: 94 34 Average       Attention/Executive Function:          Trail Making Test (TMT): Raw Score (Scaled Score) Percentile     Part A 45 secs.,  1 error (10) 50 Average    Part B 96 secs.,  2 errors (12) 75 Above Average  *Based on [employer]'s Older Normative Studies (MOANS)           Scaled Score Percentile   WAIS-IV Digit Span: 11 63 Average    Forward 12 75 Above Average    Backward 10 50 Average    Sequencing 11 63 Average        Scaled Score Percentile   WAIS-IV Similarities: 11 63 Average       D-KEFS Color-Word Interference Test: Raw Score (Scaled Score) Percentile     Color Naming 29 secs. (13) 84 Above Average    Word Reading 22 secs. (12) 75 Above Average    Inhibition 66 secs. (13) 84 Above Average      Total Errors 1 error (12) 75 Above Average    Inhibition/Switching 67 secs. (14) 91 Above Average      Total Errors 2 errors (12) 75 Above Average       NAB Executive Functions Module, Form 1: T Score Percentile     Judgment 63 91 Well Above Average       Language:          Verbal Fluency Test: Raw Score (Scaled Score)  Percentile  Phonemic Fluency (CFL) 47 (13) 84 Above Average    Category Fluency 47 (13) 84 Above Average  *Based on [employer]'s Older Normative Studies (MOANS)          NAB Language Module, Form 1: T Score Percentile     Auditory Comprehension 58 79 Above Average    Naming 31/31 (63) 91 Well Above Average       Visuospatial/Visuoconstruction:      Raw Score Percentile   Clock Drawing: 8/10 --- Within Normal Limits        Scaled Score Percentile   WAIS-IV Block Design: 10 50 Average       Mood and Personality:      Raw Score Percentile   Geriatric Depression Scale: 28 --- Severe  Geriatric Anxiety Scale: 28 --- Severe    Somatic 11 --- Moderate    Cognitive 9 --- Severe    Affective 8 --- Moderate       Additional Questionnaires:      Raw Score Percentile   PROMIS Sleep Disturbance Questionnaire: 36 --- Moderate   Informed Consent and Coding/Compliance:   The current evaluation represents a clinical evaluation for the purposes previously outlined by the referral source and is in no way reflective of a forensic evaluation.   Mr. Delarocha was provided with a verbal description of the nature and purpose of the present neuropsychological evaluation. Also reviewed were the foreseeable risks and/or discomforts and benefits of the procedure, limits of confidentiality, and mandatory reporting requirements of this provider. The patient was given the opportunity to ask questions and receive answers about the evaluation. Oral consent to participate was provided by the patient.   This evaluation was conducted by Christia Reading, Ph.D., licensed clinical neuropsychologist. Mr. Conery completed a clinical interview with Dr. Melvyn Novas, billed as one unit [phone], and 130 minutes of cognitive testing and scoring, billed as one unit [phone] and three additional units 96139. Psychometrist Milana Kidney, B.S., assisted Dr. Melvyn Novas with test administration and scoring procedures. As a separate and discrete service,  Dr. Melvyn Novas spent a total of 160 minutes in interpretation and report writing billed as one unit [phone] and two units 96133.

## 2021-01-01 ENCOUNTER — Other Ambulatory Visit: Payer: Self-pay

## 2021-01-01 ENCOUNTER — Ambulatory Visit (INDEPENDENT_AMBULATORY_CARE_PROVIDER_SITE_OTHER): Payer: Medicare Other | Admitting: Psychology

## 2021-01-01 DIAGNOSIS — Z789 Other specified health status: Secondary | ICD-10-CM

## 2021-01-01 DIAGNOSIS — R4189 Other symptoms and signs involving cognitive functions and awareness: Secondary | ICD-10-CM

## 2021-01-01 DIAGNOSIS — F411 Generalized anxiety disorder: Secondary | ICD-10-CM

## 2021-01-01 DIAGNOSIS — Z7289 Other problems related to lifestyle: Secondary | ICD-10-CM

## 2021-01-01 DIAGNOSIS — F332 Major depressive disorder, recurrent severe without psychotic features: Secondary | ICD-10-CM

## 2021-01-01 NOTE — Progress Notes (Signed)
   Neuropsychology Feedback Session Tillie Rung. [facility] Department of Neurology  Reason for Referral:   Marcus [name] is a 80 y.o. right-handed Caucasian male referred by  Dorothyann Peng, NP , to characterize his current cognitive functioning and assist with diagnostic clarity and treatment planning in the context of subjective cognitive decline, particularly surrounding short-term memory.  Feedback:   Mr. Bhardwaj completed a comprehensive neuropsychological evaluation on 12/25/2020. Please refer to that encounter for the full report and recommendations. Briefly, results suggested an isolated impairment copying a complex figure, as well as some variability across retrieval/consolidation aspects of verbal and visual memory.The former was likely caused by poor attention to detail (i.e., Mr. Waterson omitted several internal aspects of the figure) rather than notable visuospatial distortions or abnormalities. All other assessed domains were consistently appropriate. Across mood-related questionnaires, Mr. Gorin responses suggested rates of both anxiety and depression in the severe range. He also reported moderate sleep dysfunction, likely related to his history of obstructive sleep apnea and significant symptoms of reflux which directly impact his ability to fall and remain asleep. It is also worth highlighting that Mr. Macgowan reported high rates of alcohol consumption (i.e., 4-5 glasses of wine or 2-3 beers on a near nightly basis). All of these variables, whether in isolation or combined, will directly impact cognitive functioning and can potentially explain variability across memory testing, as well as subjective difficulties in his day-to-day life.  Mr. Powe was unaccompanied during the current feedback session. Content of the current session focused on the results of his neuropsychological evaluation. Mr. Rathe was given the opportunity to ask questions and his questions were  answered. He was encouraged to reach out should additional questions arise. A copy of his report was provided at the conclusion of the visit.      Less than 16 minutes were spent conducting the current feedback session with Mr. Donald.

## 2021-01-28 ENCOUNTER — Other Ambulatory Visit: Payer: Self-pay | Admitting: Adult Health

## 2021-02-02 ENCOUNTER — Ambulatory Visit: Payer: Medicare Other | Admitting: Podiatry

## 2021-02-02 ENCOUNTER — Ambulatory Visit (INDEPENDENT_AMBULATORY_CARE_PROVIDER_SITE_OTHER): Payer: Medicare Other

## 2021-02-02 ENCOUNTER — Other Ambulatory Visit: Payer: Self-pay

## 2021-02-02 ENCOUNTER — Encounter: Payer: Self-pay | Admitting: Podiatry

## 2021-02-02 DIAGNOSIS — M79671 Pain in right foot: Secondary | ICD-10-CM | POA: Diagnosis not present

## 2021-02-02 DIAGNOSIS — L600 Ingrowing nail: Secondary | ICD-10-CM | POA: Diagnosis not present

## 2021-02-02 DIAGNOSIS — M779 Enthesopathy, unspecified: Secondary | ICD-10-CM | POA: Diagnosis not present

## 2021-02-02 DIAGNOSIS — M79672 Pain in left foot: Secondary | ICD-10-CM

## 2021-02-02 DIAGNOSIS — J479 Bronchiectasis, uncomplicated: Secondary | ICD-10-CM | POA: Diagnosis not present

## 2021-02-02 NOTE — Progress Notes (Signed)
Subjective:   Patient ID: Marcus [name], male   DOB: 80 y.o.   MRN: [id number]   HPI Patient presents stating he has a painful ingrown toenail on his right big toe and gets generalized pain in both feet with inflammation around the joints that he wanted to get checked.  States it feels full and is not sure of the problem and patient does not smoke likes to be active   Review of Systems  All other systems reviewed and are negative.      Objective:  Physical Exam Vitals and nursing note reviewed.  Constitutional:      Appearance: He is well-developed.  Pulmonary:     Effort: Pulmonary effort is normal.  Musculoskeletal:        General: Normal range of motion.  Skin:    General: Skin is warm.  Neurological:     Mental Status: He is alert.    Neurovascular status was found to be intact muscle strength was found to be adequate range of motion adequate subtalar midtarsal joint.  Moderate digital deformities bilateral with inflammation around the lesser MPJs localized in nature with incurvation lateral border right hallux painful when pressed with slight distal redness.  Patient has good digital perfusion well oriented x3     Assessment:  Chronic inflammatory capsulitis bilateral with digital deformities along with ingrown toenail deformity right hallux lateral border     Plan:  H&P reviewed both conditions discussed treatment options.  At this point for the localized pain he will use elevation and shoe gear along with over-the-counter insoles and I have recommended correction of the ingrown toenail and he wants this fixed understanding procedure risk and I allowed him to read consent form going over alternative treatments complications.  Patient is willing to accept signed consent form I infiltrated 60 mg like Marcaine mixture sterile prep and remove the lateral border exposed matrix applied phenol 3 applications 30 seconds followed by alcohol lavage sterile dressing gave instructions  on soaks and reappoint to recheck  X-rays indicate that there is moderate narrowness of the joint surfaces bilateral

## 2021-02-02 NOTE — Patient Instructions (Signed)

## 2021-02-04 ENCOUNTER — Telehealth: Payer: Self-pay

## 2021-02-04 NOTE — Chronic Care Management (AMB) (Signed)
  Care Management   Note  02/04/2021 Name: Marcus [name] MRN: [id number] DOB: May 24, 1941  Marcus [name] is a 80 y.o. year old male who is a primary care patient of Nafziger, Tommi Rumps, NP and is actively engaged with the care management team.  Marcus [name] reached out by phone today to see why he had a follow up visit  scheduled with the Pharmacist  Follow up plan: Patient declines further follow up and engagement by the care management team. Appropriate care team members and provider have been notified via electronic communication.   Noreene Larsson, [address], [place], [place] [address] Direct Dial: [phone] [email or web address]: [email or web address]

## 2021-02-09 ENCOUNTER — Telehealth: Payer: Medicare Other

## 2021-02-16 ENCOUNTER — Ambulatory Visit (INDEPENDENT_AMBULATORY_CARE_PROVIDER_SITE_OTHER): Payer: Medicare Other

## 2021-02-16 DIAGNOSIS — I4430 Unspecified atrioventricular block: Secondary | ICD-10-CM

## 2021-02-18 LAB — CUP PACEART REMOTE DEVICE CHECK
Date Time Interrogation Session: 20220815183449
Implantable Lead Implant Date: 20200129
Implantable Lead Implant Date: 20200129
Implantable Lead Location: 753859
Implantable Lead Location: 753860
Implantable Lead Model: 377
Implantable Lead Model: 377
Implantable Lead Serial Number: 80947537
Implantable Lead Serial Number: 80970966
Implantable Pulse Generator Implant Date: 20200129
Pulse Gen Model: 407145
Pulse Gen Serial Number: 69503797

## 2021-02-20 ENCOUNTER — Other Ambulatory Visit: Payer: Self-pay | Admitting: Adult Health

## 2021-03-06 NOTE — Progress Notes (Signed)
Remote pacemaker transmission.   

## 2021-03-24 ENCOUNTER — Emergency Department (HOSPITAL_BASED_OUTPATIENT_CLINIC_OR_DEPARTMENT_OTHER): Payer: Medicare Other

## 2021-03-24 ENCOUNTER — Other Ambulatory Visit: Payer: Self-pay

## 2021-03-24 ENCOUNTER — Emergency Department (HOSPITAL_BASED_OUTPATIENT_CLINIC_OR_DEPARTMENT_OTHER)
Admission: EM | Admit: 2021-03-24 | Discharge: 2021-03-24 | Disposition: A | Discharge status: Home / Self Care | Payer: Medicare Other | Attending: Emergency Medicine | Admitting: Emergency Medicine

## 2021-03-24 ENCOUNTER — Encounter (HOSPITAL_BASED_OUTPATIENT_CLINIC_OR_DEPARTMENT_OTHER): Payer: Self-pay | Admitting: *Deleted

## 2021-03-24 DIAGNOSIS — W293XXA Contact with powered garden and outdoor hand tools and machinery, initial encounter: Secondary | ICD-10-CM | POA: Diagnosis not present

## 2021-03-24 DIAGNOSIS — I10 Essential (primary) hypertension: Secondary | ICD-10-CM | POA: Insufficient documentation

## 2021-03-24 DIAGNOSIS — Z7982 Long term (current) use of aspirin: Secondary | ICD-10-CM | POA: Insufficient documentation

## 2021-03-24 DIAGNOSIS — Z87891 Personal history of nicotine dependence: Secondary | ICD-10-CM | POA: Diagnosis not present

## 2021-03-24 DIAGNOSIS — I2510 Atherosclerotic heart disease of native coronary artery without angina pectoris: Secondary | ICD-10-CM | POA: Insufficient documentation

## 2021-03-24 DIAGNOSIS — Z79899 Other long term (current) drug therapy: Secondary | ICD-10-CM | POA: Insufficient documentation

## 2021-03-24 DIAGNOSIS — Z23 Encounter for immunization: Secondary | ICD-10-CM | POA: Insufficient documentation

## 2021-03-24 DIAGNOSIS — S61211A Laceration without foreign body of left index finger without damage to nail, initial encounter: Secondary | ICD-10-CM | POA: Diagnosis not present

## 2021-03-24 DIAGNOSIS — M154 Erosive (osteo)arthritis: Secondary | ICD-10-CM | POA: Diagnosis not present

## 2021-03-24 DIAGNOSIS — Z96652 Presence of left artificial knee joint: Secondary | ICD-10-CM | POA: Diagnosis not present

## 2021-03-24 DIAGNOSIS — Z8546 Personal history of malignant neoplasm of prostate: Secondary | ICD-10-CM | POA: Insufficient documentation

## 2021-03-24 DIAGNOSIS — S6992XA Unspecified injury of left wrist, hand and finger(s), initial encounter: Secondary | ICD-10-CM | POA: Diagnosis present

## 2021-03-24 MED ORDER — LIDOCAINE HCL (PF) 1 % IJ SOLN
5.0000 mL | Freq: Once | INTRAMUSCULAR | Status: AC
  Administered 2021-03-24: 5 mL

## 2021-03-24 MED ORDER — TETANUS-DIPHTH-ACELL PERTUSSIS 5-2.5-18.5 LF-MCG/0.5 IM SUSY
0.5000 mL | PREFILLED_SYRINGE | Freq: Once | INTRAMUSCULAR | Status: AC
  Administered 2021-03-24: 0.5 mL via INTRAMUSCULAR
  Filled 2021-03-24: qty 0.5

## 2021-03-24 MED ORDER — LIDOCAINE HCL (PF) 1 % IJ SOLN
30.0000 mL | Freq: Once | INTRAMUSCULAR | Status: DC
  Filled 2021-03-24: qty 30

## 2021-03-24 MED ORDER — CEFAZOLIN SODIUM 1 G IJ SOLR
1.0000 g | Freq: Once | INTRAMUSCULAR | Status: AC
  Administered 2021-03-24: 1 g via INTRAMUSCULAR
  Filled 2021-03-24: qty 10

## 2021-03-24 MED ORDER — CEPHALEXIN 500 MG PO CAPS: ORAL_CAPSULE | ORAL | 0 refills | Status: DC

## 2021-03-24 NOTE — ED Provider Notes (Signed)
[facility] Provider Note   CSN: [id number] Arrival date & time: 03/24/21  1040     History Chief Complaint  Patient presents with   Laceration    Marcus [name] is a 80 y.o. male.  Who presents with laceration of the left index finger.  Patient was using a chainsaw to cut up wood when it bounced off and not in the tree and hit his left finger.  He is unsure of his last tetanus vaccination.  He denies any weakness, numbness in the left finger.  He was unable to stop the bleeding and presented to the emergency department for further evaluation.  This laceration occurred approximately 5 hours ago.  The history is provided by the patient. No language interpreter was used.  Laceration Location:  Finger Associated symptoms: no fever       Past Medical History:  Diagnosis Date   Acute meniscal tear of knee left   Adjustment disorder with depressed mood 2019   related to prior passing of his wife   Arthritis    generalized   AV block 08/01/2018   Benign essential hypertension 01/18/2007   Bradycardia 01/08/2011   Bronchiectasis with acute exacerbation 06/23/2020   Calcaneal spur    Chest pain, atypical 08/23/2014   Chronic cough 10/07/2014   Coronary artery disease    Cramps of lower extremity    in the mornings, occasionally   Degeneration of lumbar intervertebral disc 10/14/2017   Diverticulosis of colon 07/07/2005   Dry eye syndrome of bilateral lacrimal glands    Echocardiogram findings abnormal, without diagnosis 04/23/2010   normal LV function, mod. left atrial enlargement, mild right artrial enlargement and grade 1 diastolic dysfunction   Edema of lower extremity    ankles, elevates legs   Elevated PSA 06/19/2014   Frequency of urination    followed by dr dalhstedt   GERD (gastroesophageal reflux disease) 01/18/2007   Gout, unspecified 01/18/2007   Hemorrhoids 01/19/2011   Hiatal hernia    History of colonic polyps 01/18/2007    History of malignant neoplasm of colon 1988   surgically removed; no radiation or chemotherapy   History of malignant neoplasm of prostate    Hyperlipidemia    Insomnia, unspecified 08/21/2007   Iron deficiency anemia, unspecified  01/19/2011   Lumbar post-laminectomy syndrome 10/14/2017   Lumbar spondylolysis    Lumbar strain 12/14/2011   Malaria 1964   Normal nuclear stress test 04/23/2010   ef 51%,  septal hypokinesis, normal perfusion   Obstructive sleep apnea 01/18/2007   uses CPAP nightly   Paraesophageal hernia    large   Personal history of colonic polyps 08/21/2009   TUBULAR ADENOMAS   Pneumonia    history of; last episode 2015   Postgastric surgery syndromes    Presence of permanent cardiac pacemaker    Primary osteoarthritis of knee 05/23/2015   Prostate cancer ([place]) 2016   S/P knee replacement 05/23/2015   Sensorineural hearing loss, bilateral    Skin lesion of face 07/23/2011   Ventral hernia    Vitamin B12 deficiency    Vitamin D deficiency 10/28/2009    Patient Active Problem List   Diagnosis Date Noted   Coronary artery disease 12/25/2020   Vitamin B12 deficiency 12/25/2020   Dry eye syndrome of bilateral lacrimal glands    Sensorineural hearing loss, bilateral    History of malignant neoplasm of prostate    Bronchiectasis with acute exacerbation 06/23/2020   Pacemaker 03/26/2020  Syncope 08/01/2018   AV block 08/01/2018   Degeneration of lumbar intervertebral disc 10/14/2017   Lumbar post-laminectomy syndrome 10/14/2017   Primary osteoarthritis of knee 05/23/2015   S/P knee replacement 05/23/2015   Chronic cough 10/07/2014   Chest pain, atypical 08/23/2014   Elevated PSA 06/19/2014   Adjustment disorder with depressed mood    Lumbar strain 12/14/2011   Lump in chest 03/31/2011   Iron deficiency anemia, unspecified  01/19/2011   Hemorrhoids 01/19/2011   Bradycardia 01/08/2011   Vitamin D deficiency 10/28/2009   Calcaneal spur 03/31/2009    Hyperlipidemia 07/26/2008   Obesity 10/17/2007   Insomnia, unspecified 08/21/2007   Gout, unspecified 01/18/2007   Obstructive sleep apnea 01/18/2007   Benign essential hypertension 01/18/2007   GERD (gastroesophageal reflux disease) 01/18/2007   History of malignant neoplasm of colon 01/18/2007   History of colonic polyps 01/18/2007   Hiatal hernia 10/26/2006   Diverticulosis of colon 07/07/2005    Past Surgical History:  Procedure Laterality Date   BRONCHIAL WASHINGS  10/30/2019   Procedure: BRONCHIAL WASHINGS;  Surgeon: Julian Hy, DO;  Location: [facility];  Service: Endoscopy;;   CARDIAC CATHETERIZATION     CATARACT EXTRACTION W/ INTRAOCULAR LENS  IMPLANT, BILATERAL Bilateral    COLONOSCOPY     ESOPHAGOGASTRODUODENOSCOPY     KNEE ARTHROSCOPY Right 2007   KNEE ARTHROSCOPY  07/13/2011   Procedure: ARTHROSCOPY KNEE;  Surgeon: Cynda Familia;  Location: [facility];  Service: Orthopedics;  Laterality: Left;  partial menisectomy with chondrylplasty   KNEE SURGERY Left    LAMINECTOMY AND MICRODISCECTOMY LUMBAR SPINE  MARCH  2008   L3 -  4   LAPAROSCOPIC INCISIONAL / UMBILICAL / VENTRAL HERNIA REPAIR  2006   PACEMAKER IMPLANT N/A 08/02/2018   Procedure: PACEMAKER IMPLANT;  Surgeon: Evans Lance, MD;  Location: [facility];  Service: Cardiovascular;  Laterality: N/A;   PROSTATE BIOPSY     RADIOACTIVE SEED IMPLANT N/A 02/12/2019   Procedure: RADIOACTIVE SEED IMPLANT/BRACHYTHERAPY IMPLANT;  Surgeon: Franchot Gallo, MD;  Location: [facility];  Service: Urology;  Laterality: N/A;  90 MINS   SHOULDER ARTHROSCOPY Right 05-23-2007   SIGMOID COLECTOMY FOR CANCER  1989   SPACE OAR INSTILLATION N/A 02/12/2019   Procedure: SPACE OAR INSTILLATION;  Surgeon: Franchot Gallo, MD;  Location: [facility];  Service: Urology;  Laterality: N/A;   TOTAL KNEE ARTHROPLASTY Left 05/23/2015   Procedure: LEFT TOTAL KNEE ARTHROPLASTY;  Surgeon: Sydnee Cabal, MD;  Location: [facility];  Service: Orthopedics;  Laterality: Left;   VERTICAL BANDED GASTROPLASTY  1986   VIDEO BRONCHOSCOPY N/A 10/30/2019   Procedure: VIDEO BRONCHOSCOPY WITH BRONICAL ALVEROLAR LAVAGE WITHOUT FLUORO;  Surgeon: Julian Hy, DO;  Location: [facility];  Service: Endoscopy;  Laterality: N/A;       Family History  Problem Relation Age of Onset   Stroke Paternal Grandfather    Heart disease Paternal Grandfather    Esophagitis Father        died from perforated esophagus   Breast cancer Mother    Colon cancer Maternal Grandmother        questionable   Esophageal cancer Neg Hx    Prostate cancer Neg Hx    Stomach cancer Neg Hx     Social History   Tobacco Use   Smoking status: Former    Packs/day: 2.00    Years: 20.00    Pack years: 40.00    Types: Cigarettes    Quit date: 07/05/1974  Years since quitting: 46.7   Smokeless tobacco: Never  Vaping Use   Vaping Use: Never used  Substance Use Topics   Alcohol use: Not Currently    Alcohol/week: 30.0 standard drinks    Types: 30 Standard drinks or equivalent per week    Comment: 4-5 glasses of wine or 2-3 beers nightly   Drug use: No    Home Medications Prior to Admission medications   Medication Sig Start Date End Date Taking? Authorizing Provider  allopurinol (ZYLOPRIM) 300 MG tablet TAKE 1 TABLET BY MOUTH EVERY DAY 02/20/21   Nafziger, Tommi Rumps, NP  amLODipine (NORVASC) 10 MG tablet TAKE 1 TABLET BY MOUTH EVERY DAY 01/28/21   Nafziger, Tommi Rumps, NP  aspirin EC 81 MG tablet Take 1 tablet (81 mg total) by mouth daily. Swallow whole. 05/22/20   Lelon Perla, MD  Calcium Carb-Cholecalciferol (CALCIUM 600 + D PO) Take 1 tablet by mouth daily. 800 units of vitamin D    [provider]  docusate sodium (COLACE) 100 MG capsule Take 100 mg by mouth daily.     [provider]  eszopiclone (LUNESTA) 2 MG TABS tablet Take 1 tablet (2 mg total) by mouth at bedtime as needed for sleep. Take immediately before bedtime  09/24/20   Nafziger, Tommi Rumps, NP  HYDROcodone-acetaminophen (NORCO/VICODIN) 5-325 MG tablet hydrocodone 5 mg-acetaminophen 325 mg tablet  Take 1 tablet 4 times a day by oral route as needed.    [provider]  meclizine (ANTIVERT) 25 MG tablet Take 1 tablet (25 mg total) by mouth 3 (three) times daily as needed for dizziness. 04/22/20   Nafziger, Tommi Rumps, NP  metoCLOPramide (REGLAN) 10 MG tablet TAKE 1 TABLET (10 MG TOTAL) BY MOUTH AT BEDTIME AS NEEDED FOR NAUSEA. 03/06/20   Zehr, Laban Emperor, PA-C  Multiple Vitamin (MULTIVITAMIN) tablet Take 1 tablet by mouth daily.    [provider]  omeprazole (PRILOSEC) 40 MG capsule TAKE 1 CAPSULE BY MOUTH EVERY DAY 09/12/20   Nafziger, Tommi Rumps, NP  oxyCODONE-acetaminophen (PERCOCET/ROXICET) 5-325 MG tablet Take 1-2 tablets by mouth every 6 (six) hours as needed for severe pain. 08/02/20   Robinson, [place] N, PA-C  oxymetazoline (AFRIN) 0.05 % nasal spray Place 1 spray into both nostrils 2 (two) times daily.    [provider]  potassium gluconate 595 (99 K) MG TABS tablet Take 595 mg by mouth daily.    [provider]  Respiratory Therapy Supplies (FLUTTER) DEVI 1 each by Does not apply route in the morning and at bedtime. 10/11/19   Julian Hy, DO  rosuvastatin (CRESTOR) 20 MG tablet Take 1 tablet (20 mg total) by mouth daily. 05/22/20 08/20/20  Lelon Perla, MD  tamsulosin (FLOMAX) 0.4 MG CAPS capsule Take 0.4 mg by mouth daily. 03/04/16   [provider]  tiZANidine (ZANAFLEX) 2 MG tablet Take 1 tablet (2 mg total) by mouth every 8 (eight) hours as needed for muscle spasms. 08/02/20   Robinson, [place] N, PA-C  traMADol (ULTRAM) 50 MG tablet tramadol 50 mg tablet  Take 1 tablet every 6 hours by oral route as needed.    [provider]  valsartan-hydrochlorothiazide (DIOVAN-HCT) 320-25 MG tablet TAKE 1 TABLET BY MOUTH DAILY. **DUE FOR YEARLY PHYSICAL** 11/24/20   Nafziger, Tommi Rumps, NP  vitamin B-12 (CYANOCOBALAMIN) 1000  MCG tablet Take 1,000 mcg by mouth daily.     [provider]    Allergies    Contrast media [iodinated diagnostic agents]  Review of Systems  Review of Systems  Constitutional:  Negative for chills and fever.  Skin:  Positive for wound.  Neurological:  Negative for weakness and numbness.   Physical Exam Updated Vital Signs BP 139/71 (BP Location: Left Arm)   Pulse (!) 59   Temp 98.3 F (36.8 C) (Oral)   Resp 20   Ht 5\' 8"  (1.727 m)   Wt 111.1 kg   SpO2 100%   BMI 37.24 kg/m   Physical Exam Vitals and nursing note reviewed.  Constitutional:      General: He is not in acute distress.    Appearance: He is well-developed. He is not diaphoretic.  HENT:     Head: Normocephalic and atraumatic.  Eyes:     General: No scleral icterus.    Conjunctiva/sclera: Conjunctivae normal.  Cardiovascular:     Rate and Rhythm: Normal rate and regular rhythm.     Heart sounds: Normal heart sounds.  Pulmonary:     Effort: Pulmonary effort is normal. No respiratory distress.     Breath sounds: Normal breath sounds.  Abdominal:     Palpations: Abdomen is soft.     Tenderness: There is no abdominal tenderness.  Musculoskeletal:     Cervical back: Normal range of motion and neck supple.     Comments: Finger laceration of the left index finger.  No tendon disruption.  There does appear to be a small cut to the dorsal surface of the left proximal phalanx  Skin:    General: Skin is warm and dry.  Neurological:     Mental Status: He is alert.  Psychiatric:        Behavior: Behavior normal.    ED Results / Procedures / Treatments   Labs (all labs ordered are listed, but only abnormal results are displayed) Labs Reviewed - No data to display  EKG None  Radiology DG Finger Index Left  Result Date: 03/24/2021 CLINICAL DATA:  Laceration with a chain saw EXAM: LEFT INDEX FINGER 2+V COMPARISON:  None. 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 No visible acute fracture. Two punctate radiodense foreign objects ventral to the distal phalanx. IMPRESSION: No acute fracture. 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 Two punctate foreign objects visible on the lateral view ventral to the distal phalanx. Electronically Signed   By: Nelson Chimes M.D.   On: 03/24/2021 12:27    Procedures .[name] KitchenLaceration Repair  Date/Time: 03/24/2021 4:25 PM Performed by: Margarita Mail, PA-C Authorized by: Margarita Mail, PA-C   Consent:    Consent obtained:  Verbal   Consent given by:  Patient   Risks discussed:  Infection, need for additional repair, pain, poor cosmetic result and poor wound healing   Alternatives discussed:  No treatment and delayed treatment Universal protocol:    Procedure explained and questions answered to patient or proxy's satisfaction: yes     Relevant documents present and verified: yes     Test results available: yes     Imaging studies available: yes     Required blood products, implants, devices, and special equipment available: yes     Site/side marked: yes     Immediately prior to procedure, a time out was called: yes     Patient identity confirmed:  Verbally with patient Anesthesia:    Anesthesia method:  Local infiltration   Local anesthetic:  Lidocaine 1% w/o epi Laceration details:    Location:  Finger   Finger location:  L index finger  Length (cm):  2.5   Depth (mm):  10 Pre-procedure details:    Preparation:  Patient was prepped and draped in usual sterile fashion Exploration:    Wound exploration: wound explored through full range of motion and entire depth of wound visualized     Wound extent comment:  Bony damage Treatment:    Area cleansed with:  Chlorhexidine   Amount of cleaning:  Standard   Irrigation solution:  Sterile saline   Irrigation method:  Pressure wash   Visualized foreign bodies/material removed: no     Debridement:  None   Undermining:  None    Scar revision: no   Skin repair:    Repair method:  Sutures   Suture size:  5-0   Wound skin closure material used: vicryl rapide.   Suture technique:  Running locked   Number of sutures:  13 Approximation:    Approximation:  Close Repair type:    Repair type:  Simple Post-procedure details:    Dressing:  Sterile dressing   Procedure completion:  Tolerated well, no immediate complications .Splint Application  Date/Time: 03/24/2021 4:27 PM Performed by: Margarita Mail, PA-C Authorized by: Margarita Mail, PA-C   Consent:    Consent obtained:  Verbal   Consent given by:  Patient   Risks discussed:  Discoloration, numbness, pain and swelling   Alternatives discussed:  No treatment Universal protocol:    Patient identity confirmed:  Verbally with patient Pre-procedure details:    Distal neurologic exam:  Normal   Distal perfusion: distal pulses strong   Procedure details:    Location:  Finger   Finger location:  L index finger   Splint type:  Finger   Supplies:  Aluminum splint Post-procedure details:    Distal neurologic exam:  Normal   Distal perfusion: brisk capillary refill     Procedure completion:  Tolerated well, no immediate complications   Medications Ordered in ED Medications  Tdap (BOOSTRIX) injection 0.5 mL (0.5 mLs Intramuscular Given 03/24/21 1526)  lidocaine (PF) (XYLOCAINE) 1 % injection 5 mL (5 mLs Infiltration Given 03/24/21 1526)    ED Course  I have reviewed the triage vital signs and the nursing notes.  Pertinent labs & imaging results that were available during my care of the patient were reviewed by me and considered in my medical decision making (see chart for details).    MDM Rules/Calculators/A&P                           Patient with laceration to the left index finger, no obvious tendon disruption or nerve damage.  Patient has full range of motion of the finger.  There did appear to be a cut to the bony surface of the proximal phalanx.   Patient given IM Ancef, tetanus updated and will be discharged on Keflex.  He appears otherwise appropriate for discharge at this time.  His sutures are dissolving and he should not need follow-up for removal unless he has signs or symptoms of infection. Final Clinical Impression(s) / ED Diagnoses Final diagnoses:  None    Rx / DC Orders ED Discharge Orders     None        Margarita Mail, PA-C 03/24/21 1629    Drenda Freeze, MD 03/26/21 1356

## 2021-03-24 NOTE — Discharge Instructions (Addendum)
WOUND CARE   Keep area clean and dry for 24 hours. Do not remove bandage, if applied.  After 24 hours, remove bandage and wash wound gently with mild soap and warm water. Reapply a new bandage after cleaning wound, if directed.  Continue daily cleansing with soap and water until stitches/staples are removed.  Do not apply any ointments or creams to the wound while stitches/staples are in place, as this may cause delayed healing.  Seek medical careif you experience any of the following signs of infection: Swelling, redness, pus drainage, streaking, fever >101.0 F  Seek care if you experience excessive bleeding that does not stop after 15-20 minutes of constant, firm pressure.

## 2021-03-24 NOTE — ED Triage Notes (Signed)
Laceration to his left index finger with a chain saw this am. Bleeding controlled. Xray from triage.

## 2021-03-25 ENCOUNTER — Encounter: Payer: Medicare Other | Admitting: Internal Medicine

## 2021-04-13 ENCOUNTER — Other Ambulatory Visit: Payer: Self-pay

## 2021-04-14 ENCOUNTER — Encounter: Payer: Self-pay | Admitting: Adult Health

## 2021-04-14 ENCOUNTER — Ambulatory Visit (INDEPENDENT_AMBULATORY_CARE_PROVIDER_SITE_OTHER): Payer: Medicare Other | Admitting: Adult Health

## 2021-04-14 VITALS — BP 120/70 | HR 72 | Temp 98.1°F | Ht 68.0 in | Wt 236.0 lb

## 2021-04-14 DIAGNOSIS — D492 Neoplasm of unspecified behavior of bone, soft tissue, and skin: Secondary | ICD-10-CM | POA: Diagnosis not present

## 2021-04-14 DIAGNOSIS — M2170 Unequal limb length (acquired), unspecified site: Secondary | ICD-10-CM | POA: Diagnosis not present

## 2021-04-14 NOTE — Progress Notes (Signed)
Subjective:    Patient ID: Marcus [name], male    DOB: 12-09-1940, 80 y.o.   MRN: [id number]  HPI 80 year old male who  has a past medical history of Acute meniscal tear of knee (left), Adjustment disorder with depressed mood (2019), Arthritis, AV block (08/01/2018), Benign essential hypertension (01/18/2007), Bradycardia (01/08/2011), Bronchiectasis with acute exacerbation (06/23/2020), Calcaneal spur, Chest pain, atypical (08/23/2014), Chronic cough (10/07/2014), Coronary artery disease, Cramps of lower extremity, Degeneration of lumbar intervertebral disc (10/14/2017), Diverticulosis of colon (07/07/2005), Dry eye syndrome of bilateral lacrimal glands, Echocardiogram findings abnormal, without diagnosis (04/23/2010), Edema of lower extremity, Elevated PSA (06/19/2014), Frequency of urination, GERD (gastroesophageal reflux disease) (01/18/2007), Gout, unspecified (01/18/2007), Hemorrhoids (01/19/2011), Hiatal hernia, History of colonic polyps (01/18/2007), History of malignant neoplasm of colon (1988), History of malignant neoplasm of prostate, Hyperlipidemia, Insomnia, unspecified (08/21/2007), Iron deficiency anemia, unspecified  (01/19/2011), Lumbar post-laminectomy syndrome (10/14/2017), Lumbar spondylolysis, Lumbar strain (12/14/2011), Malaria (1964), Normal nuclear stress test (04/23/2010), Obstructive sleep apnea (01/18/2007), Paraesophageal hernia, Personal history of colonic polyps (08/21/2009), Pneumonia, Postgastric surgery syndromes, Presence of permanent cardiac pacemaker, Primary osteoarthritis of knee (05/23/2015), Prostate cancer ([place]) (2016), S/P knee replacement (05/23/2015), Sensorineural hearing loss, bilateral, Skin lesion of face (07/23/2011), Ventral hernia, Vitamin B12 deficiency, and Vitamin D deficiency (10/28/2009).  He presents to the office today for two separate issues.   Skin Neoplasms - has had dark areas on his face for at least three years. He would like to be  referred to dermatology to see if they can be removed   Limb Length discrepancy - reports right leg is about an inch shorter than his left. Causes him to limp. Has tried lifts in the past but did not get much benefit from this. He would like to be referred to podiatry   Wt Readings from Last 3 Encounters:  04/14/21 236 lb (107 kg)  03/24/21 244 lb 14.9 oz (111.1 kg)  12/10/20 245 lb (111.1 kg)   Review of Systems See HPI   Past Medical History:  Diagnosis Date   Acute meniscal tear of knee left   Adjustment disorder with depressed mood 2019   related to prior passing of his wife   Arthritis    generalized   AV block 08/01/2018   Benign essential hypertension 01/18/2007   Bradycardia 01/08/2011   Bronchiectasis with acute exacerbation 06/23/2020   Calcaneal spur    Chest pain, atypical 08/23/2014   Chronic cough 10/07/2014   Coronary artery disease    Cramps of lower extremity    in the mornings, occasionally   Degeneration of lumbar intervertebral disc 10/14/2017   Diverticulosis of colon 07/07/2005   Dry eye syndrome of bilateral lacrimal glands    Echocardiogram findings abnormal, without diagnosis 04/23/2010   normal LV function, mod. left atrial enlargement, mild right artrial enlargement and grade 1 diastolic dysfunction   Edema of lower extremity    ankles, elevates legs   Elevated PSA 06/19/2014   Frequency of urination    followed by dr dalhstedt   GERD (gastroesophageal reflux disease) 01/18/2007   Gout, unspecified 01/18/2007   Hemorrhoids 01/19/2011   Hiatal hernia    History of colonic polyps 01/18/2007   History of malignant neoplasm of colon 1988   surgically removed; no radiation or chemotherapy   History of malignant neoplasm of prostate    Hyperlipidemia    Insomnia, unspecified 08/21/2007   Iron deficiency anemia, unspecified  01/19/2011   Lumbar post-laminectomy syndrome 10/14/2017   Lumbar spondylolysis  Lumbar strain 12/14/2011   Malaria  1964   Normal nuclear stress test 04/23/2010   ef 51%,  septal hypokinesis, normal perfusion   Obstructive sleep apnea 01/18/2007   uses CPAP nightly   Paraesophageal hernia    large   Personal history of colonic polyps 08/21/2009   TUBULAR ADENOMAS   Pneumonia    history of; last episode 2015   Postgastric surgery syndromes    Presence of permanent cardiac pacemaker    Primary osteoarthritis of knee 05/23/2015   Prostate cancer ([place]) 2016   S/P knee replacement 05/23/2015   Sensorineural hearing loss, bilateral    Skin lesion of face 07/23/2011   Ventral hernia    Vitamin B12 deficiency    Vitamin D deficiency 10/28/2009    Social History   Socioeconomic History   Marital status: Widowed    Spouse name: Not on file   Number of children: 4   Years of education: 7   Highest education level: Some college, no degree  Occupational History   Occupation: retired    Fish farm manager: [employer]  Tobacco Use   Smoking status: Former    Packs/day: 2.00    Years: 20.00    Pack years: 40.00    Types: Cigarettes    Quit date: 07/05/1974    Years since quitting: 46.8   Smokeless tobacco: Never  Vaping Use   Vaping Use: Never used  Substance and Sexual Activity   Alcohol use: Not Currently    Alcohol/week: 30.0 standard drinks    Types: 30 Standard drinks or equivalent per week    Comment: 4-5 glasses of wine or 2-3 beers nightly   Drug use: No   Sexual activity: Not Currently  Other Topics Concern   Not on file  Social History Narrative   Recently widowed   Former Smoker    Alcohol use-yes 1-2 drinks per day      Occupation: Retired Midwife      Originally from [place], [place] - in [place] > 10 yrs as of 2017      Social Determinants of Health   Financial Resource Strain: Low Risk    Difficulty of Paying Living Expenses: Not hard at [employer] Insecurity: No Food Insecurity   Worried About Charity fundraiser in the Last Year: Never true   Arboriculturist in the Last  Year: Never true  Transportation Needs: No Transportation Needs   Lack of Transportation (Medical): No   Lack of Transportation (Non-Medical): No  Physical Activity: Inactive   Days of Exercise per Week: 0 days   Minutes of Exercise per Session: 0 min  Stress: No Stress Concern Present   Feeling of Stress : Not at all  Social Connections: Moderately Isolated   Frequency of Communication with Friends and Family: Three times a week   Frequency of Social Gatherings with Friends and Family: More than three times a week   Attends Religious Services: More than 4 times per year   Active Member of Clubs or Organizations: No   Attends Archivist Meetings: Never   Marital Status: Widowed  Intimate Partner Violence: Not At Risk   Fear of Current or Ex-Partner: No   Emotionally Abused: No   Physically Abused: No   Sexually Abused: No    Past Surgical History:  Procedure Laterality Date   BRONCHIAL WASHINGS  10/30/2019   Procedure: BRONCHIAL WASHINGS;  Surgeon: Julian Hy, DO;  Location: [facility];  Service:  Endoscopy;;   CARDIAC CATHETERIZATION     CATARACT EXTRACTION W/ INTRAOCULAR LENS  IMPLANT, BILATERAL Bilateral    COLONOSCOPY     ESOPHAGOGASTRODUODENOSCOPY     KNEE ARTHROSCOPY Right 2007   KNEE ARTHROSCOPY  07/13/2011   Procedure: ARTHROSCOPY KNEE;  Surgeon: Cynda Familia;  Location: [facility];  Service: Orthopedics;  Laterality: Left;  partial menisectomy with chondrylplasty   KNEE SURGERY Left    LAMINECTOMY AND MICRODISCECTOMY LUMBAR SPINE  MARCH  2008   L3 -  4   LAPAROSCOPIC INCISIONAL / UMBILICAL / VENTRAL HERNIA REPAIR  2006   PACEMAKER IMPLANT N/A 08/02/2018   Procedure: PACEMAKER IMPLANT;  Surgeon: Evans Lance, MD;  Location: [facility];  Service: Cardiovascular;  Laterality: N/A;   PROSTATE BIOPSY     RADIOACTIVE SEED IMPLANT N/A 02/12/2019   Procedure: RADIOACTIVE SEED IMPLANT/BRACHYTHERAPY IMPLANT;  Surgeon: Franchot Gallo, MD;  Location: [facility];  Service: Urology;  Laterality: N/A;  90 MINS   SHOULDER ARTHROSCOPY Right 05-23-2007   SIGMOID COLECTOMY FOR CANCER  1989   SPACE OAR INSTILLATION N/A 02/12/2019   Procedure: SPACE OAR INSTILLATION;  Surgeon: Franchot Gallo, MD;  Location: [facility];  Service: Urology;  Laterality: N/A;   TOTAL KNEE ARTHROPLASTY Left 05/23/2015   Procedure: LEFT TOTAL KNEE ARTHROPLASTY;  Surgeon: Sydnee Cabal, MD;  Location: [facility];  Service: Orthopedics;  Laterality: Left;   VERTICAL BANDED GASTROPLASTY  1986   VIDEO BRONCHOSCOPY N/A 10/30/2019   Procedure: VIDEO BRONCHOSCOPY WITH BRONICAL ALVEROLAR LAVAGE WITHOUT FLUORO;  Surgeon: Julian Hy, DO;  Location: [facility];  Service: Endoscopy;  Laterality: N/A;    Family History  Problem Relation Age of Onset   Stroke Paternal Grandfather    Heart disease Paternal Grandfather    Esophagitis Father        died from perforated esophagus   Breast cancer Mother    Colon cancer Maternal Grandmother        questionable   Esophageal cancer Neg Hx    Prostate cancer Neg Hx    Stomach cancer Neg Hx     Allergies  Allergen Reactions   Contrast Media [Iodinated Diagnostic Agents] Palpitations    TACHYCARDIA- patient states he has tolerated newer agents since this reaction >30 yrs ago    Current Outpatient Medications on File Prior to Visit  Medication Sig Dispense Refill   allopurinol (ZYLOPRIM) 300 MG tablet TAKE 1 TABLET BY MOUTH EVERY DAY 90 tablet 1   amLODipine (NORVASC) 10 MG tablet TAKE 1 TABLET BY MOUTH EVERY DAY 90 tablet 0   aspirin EC 81 MG tablet Take 1 tablet (81 mg total) by mouth daily. Swallow whole. 90 tablet 3   Calcium Carb-Cholecalciferol (CALCIUM 600 + D PO) Take 1 tablet by mouth daily. 800 units of vitamin D     cephALEXin (KEFLEX) 500 MG capsule 2 caps po bid x 7 days 28 capsule 0   docusate sodium (COLACE) 100 MG capsule Take 100 mg by mouth daily.      eszopiclone (LUNESTA) 2 MG TABS tablet  Take 1 tablet (2 mg total) by mouth at bedtime as needed for sleep. Take immediately before bedtime 30 tablet 0   HYDROcodone-acetaminophen (NORCO/VICODIN) 5-325 MG tablet hydrocodone 5 mg-acetaminophen 325 mg tablet  Take 1 tablet 4 times a day by oral route as needed.     meclizine (ANTIVERT) 25 MG tablet Take 1 tablet (25 mg total) by mouth 3 (three) times daily as needed  for dizziness. 30 tablet 0   metoCLOPramide (REGLAN) 10 MG tablet TAKE 1 TABLET (10 MG TOTAL) BY MOUTH AT BEDTIME AS NEEDED FOR NAUSEA. 90 tablet 0   Multiple Vitamin (MULTIVITAMIN) tablet Take 1 tablet by mouth daily.     omeprazole (PRILOSEC) 40 MG capsule TAKE 1 CAPSULE BY MOUTH EVERY DAY 90 capsule 1   oxyCODONE-acetaminophen (PERCOCET/ROXICET) 5-325 MG tablet Take 1-2 tablets by mouth every 6 (six) hours as needed for severe pain. 6 tablet 0   oxymetazoline (AFRIN) 0.05 % nasal spray Place 1 spray into both nostrils 2 (two) times daily.     potassium gluconate 595 (99 K) MG TABS tablet Take 595 mg by mouth daily.     Respiratory Therapy Supplies (FLUTTER) DEVI 1 each by Does not apply route in the morning and at bedtime. 1 each 0   tamsulosin (FLOMAX) 0.4 MG CAPS capsule Take 0.4 mg by mouth daily.     tiZANidine (ZANAFLEX) 2 MG tablet Take 1 tablet (2 mg total) by mouth every 8 (eight) hours as needed for muscle spasms. 21 tablet 0   traMADol (ULTRAM) 50 MG tablet tramadol 50 mg tablet  Take 1 tablet every 6 hours by oral route as needed.     valsartan-hydrochlorothiazide (DIOVAN-HCT) 320-25 MG tablet TAKE 1 TABLET BY MOUTH DAILY. **DUE FOR YEARLY PHYSICAL** 90 tablet 1   vitamin B-12 (CYANOCOBALAMIN) 1000 MCG tablet Take 1,000 mcg by mouth daily.      rosuvastatin (CRESTOR) 20 MG tablet Take 1 tablet (20 mg total) by mouth daily. 90 tablet 3   No current facility-administered medications on file prior to visit.    BP 120/70   Pulse 72   Temp 98.1 F (36.7 C) (Oral)   Ht 5\' 8"  (1.727 m)   Wt 236 lb (107 kg)    SpO2 98%   BMI 35.88 kg/m       Objective:   Physical Exam Vitals and nursing note reviewed.  Constitutional:      Appearance: Normal appearance.  Cardiovascular:     Rate and Rhythm: Normal rate and regular rhythm.     Pulses: Normal pulses.     Heart sounds: Normal heart sounds.  Pulmonary:     Effort: Pulmonary effort is normal.     Breath sounds: Normal breath sounds.  Musculoskeletal:        General: Normal range of motion.  Skin:    General: Skin is warm and dry.     Comments: Keratosis noted throughout face, more so on temples  Neurological:     Mental Status: He is alert.     Gait: Gait abnormal (limping gait).  Psychiatric:        Mood and Affect: Mood normal.        Behavior: Behavior normal.        Thought Content: Thought content normal.        Judgment: Judgment normal.      Assessment & Plan:   1. Skin neoplasm  - Ambulatory referral to Dermatology  2. Acquired unequal limb length  - Ambulatory referral to [facility], NP

## 2021-04-20 ENCOUNTER — Other Ambulatory Visit: Payer: Self-pay

## 2021-04-20 ENCOUNTER — Ambulatory Visit: Payer: Medicare Other | Admitting: Podiatry

## 2021-04-20 ENCOUNTER — Encounter: Payer: Self-pay | Admitting: Podiatry

## 2021-04-20 DIAGNOSIS — M7751 Other enthesopathy of right foot: Secondary | ICD-10-CM | POA: Diagnosis not present

## 2021-04-20 DIAGNOSIS — M7752 Other enthesopathy of left foot: Secondary | ICD-10-CM

## 2021-04-20 IMAGING — CT CT HEAD W/O CM
1 series · 16 of 30 positions shown, 20 images · non-contrast
Comparison: None.

CLINICAL DATA: Mental status change.

EXAM:
CT HEAD WITHOUT CONTRAST
TECHNIQUE: Contiguous axial images were obtained from the base of the skull
through the vertex without intravenous contrast.

[Series 2: head w/(date) · axial · 0.43mm/px · z∈[-135,+0]mm · 16 of 31 slices shown, 20 images]
[im 2/31  brain]
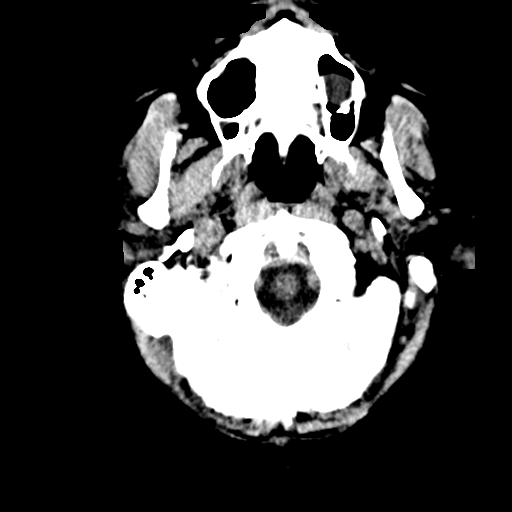
[im 2/31  bone]
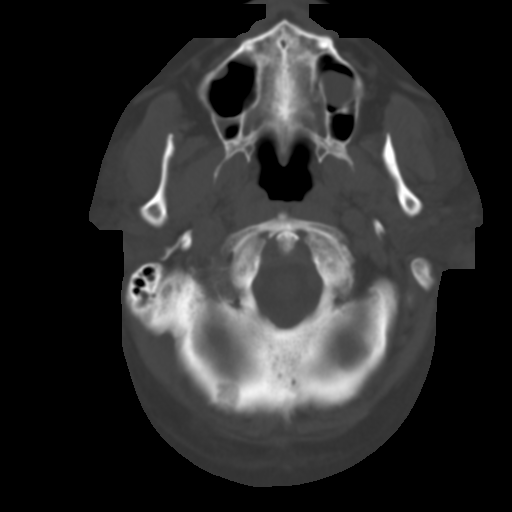
[im 4/31  brain]
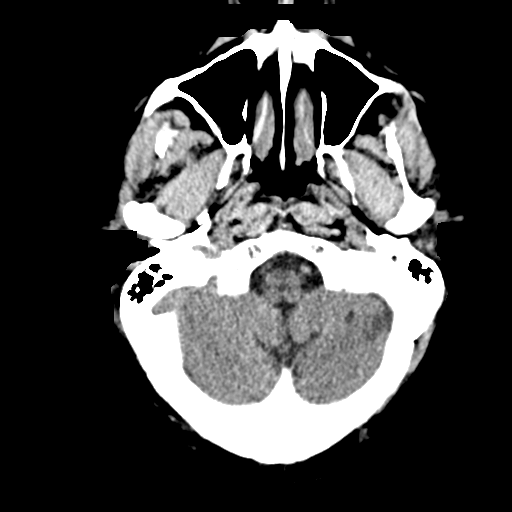
[im 6/31  brain]
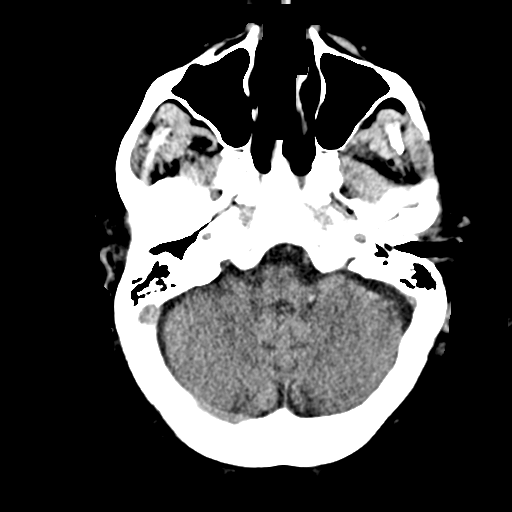
[im 8/31  brain]
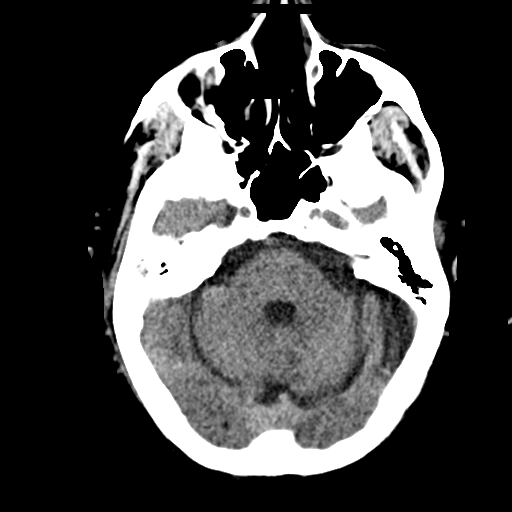
[im 9/31  brain]
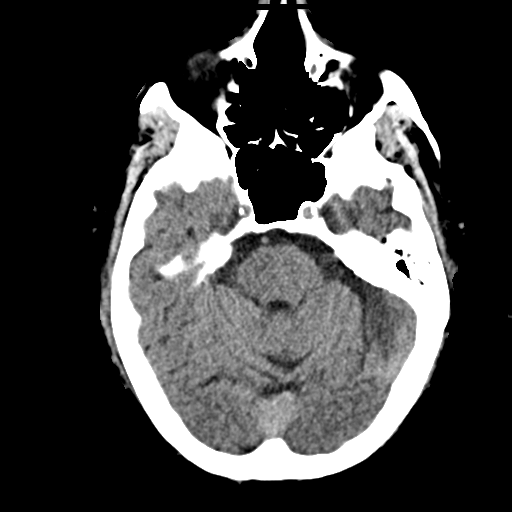
[im 9/31  bone]
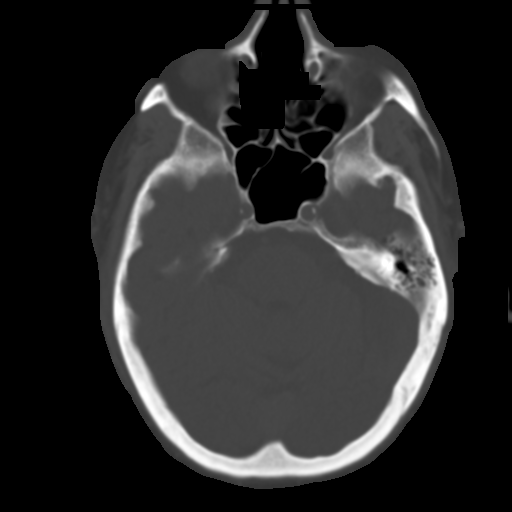
[im 11/31  brain]
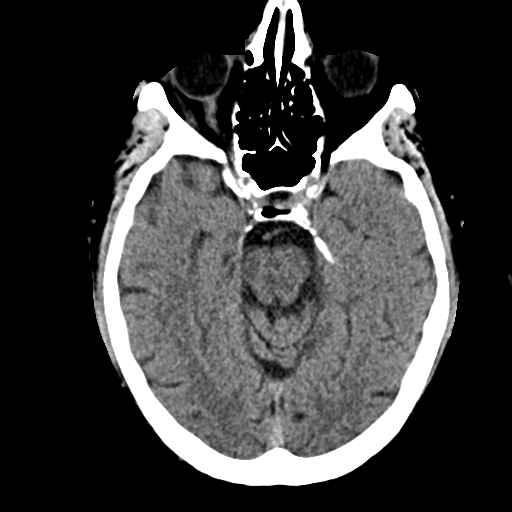
[im 13/31  brain]
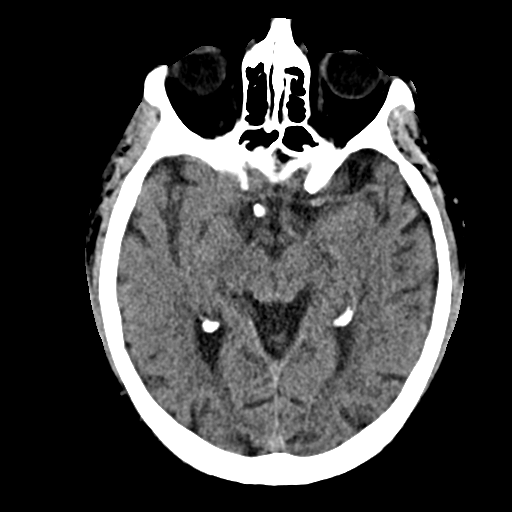
[im 15/31  brain]
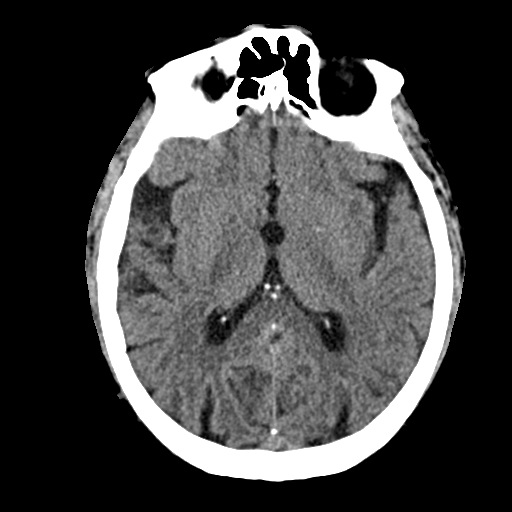
[im 16/31  brain]
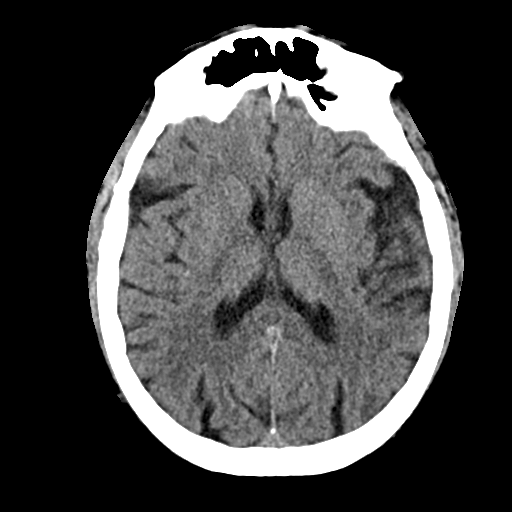
[im 16/31  bone]
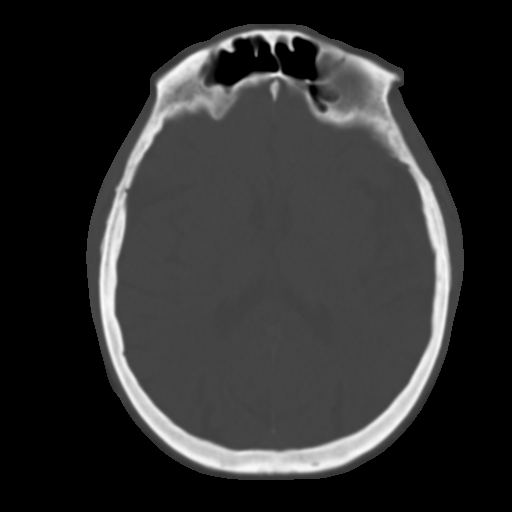
[im 18/31  brain]
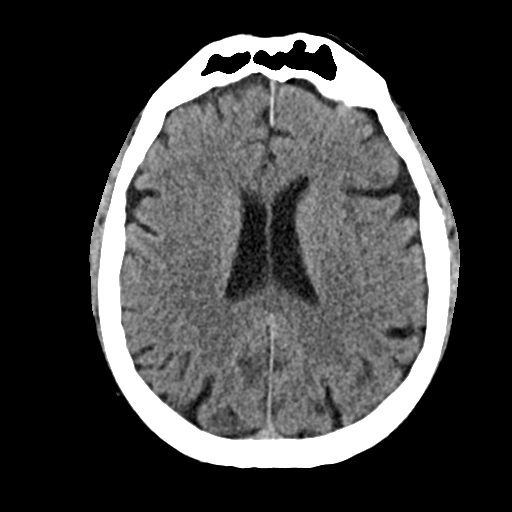
[im 20/31  brain]
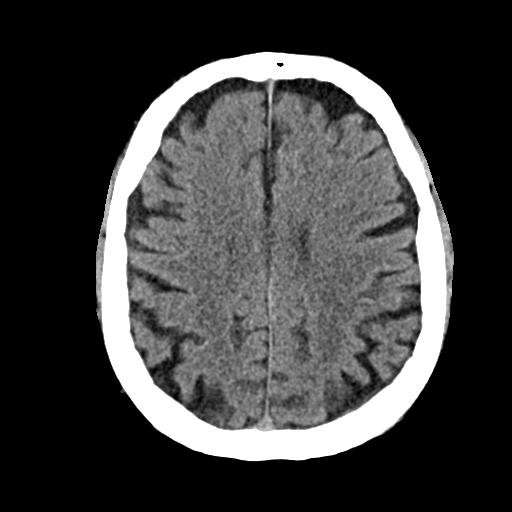
[im 22/31  brain]
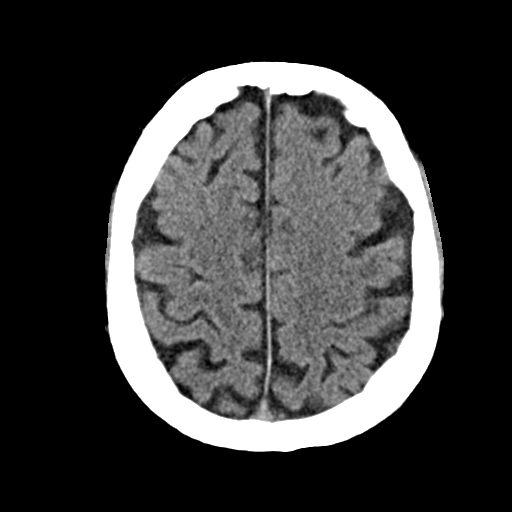
[im 23/31  brain]
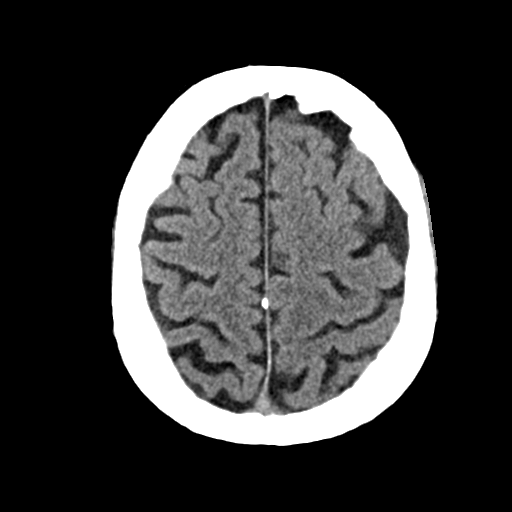
[im 23/31  bone]
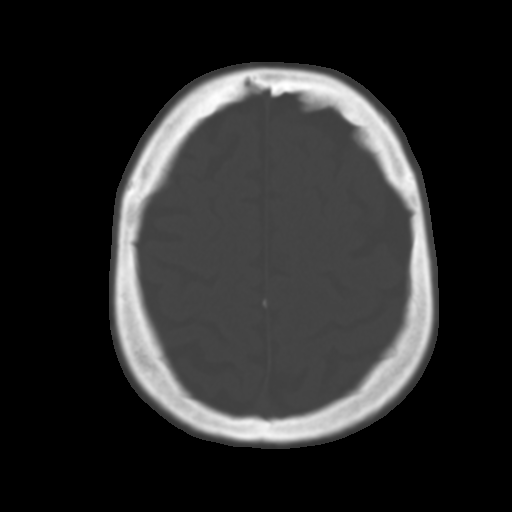
[im 25/31  brain]
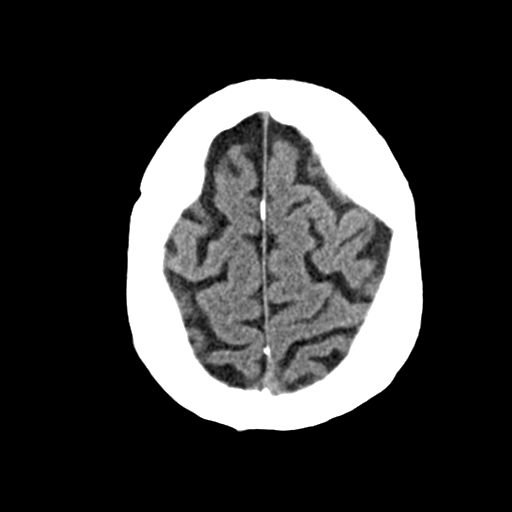
[im 27/31  brain]
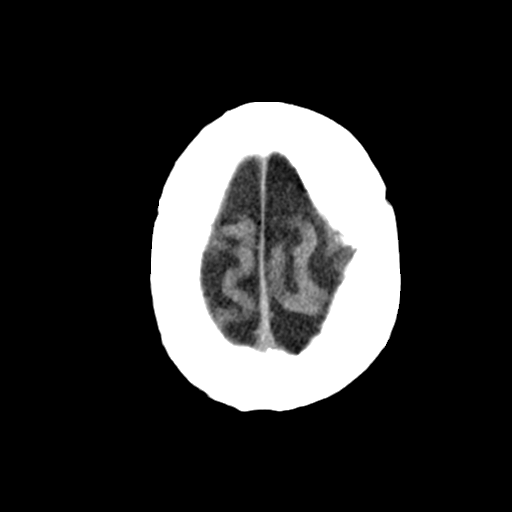
[im 29/31  brain]
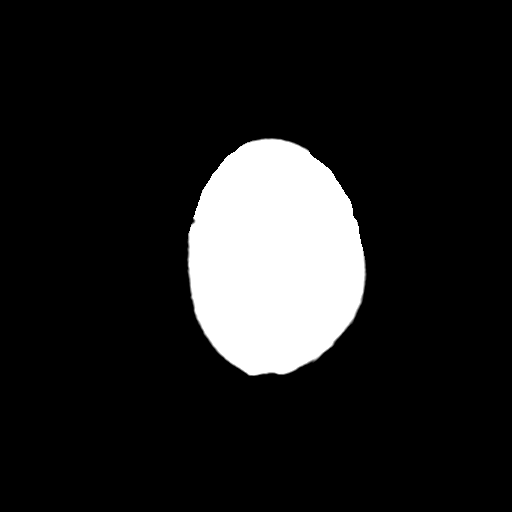

[16 of 30 positions shown; findings below may reference images not displayed]

FINDINGS: Brain: No evidence of acute large vascular territory infarction,
hemorrhage, hydrocephalus, extra-axial collection or mass
lesion/mass effect. Mild, age-appropriate volume loss.

Vascular: Calcific atherosclerosis.

Skull: No acute fracture.

Sinuses/Orbits: Inferior left maxillary sinus retention cyst.
Otherwise, sinuses are clear. Unremarkable orbits.

Other: No mastoid effusions.
IMPRESSION: No evidence of acute intracranial abnormality.

## 2021-04-20 MED ORDER — TRIAMCINOLONE ACETONIDE 10 MG/ML IJ SUSP
20.0000 mg | Freq: Once | INTRAMUSCULAR | Status: AC
  Administered 2021-04-20: 20 mg

## 2021-04-20 NOTE — Progress Notes (Signed)
Subjective:   Patient ID: Marcus [name], male   DOB: 80 y.o.   MRN: [id number]   HPI Patient presents with pain in both feet and states that he gets a lot of joint pain has a limb length with his right leg being quite a bit shorter than the left   ROS      Objective:  Physical Exam  Neurovascular status unchanged nail healed well from previous surgery with inflammation around the second metatarsal phalangeal joint right over left fluid buildup with a significant limb length with approximate half inch between the right and the left with the right leg being shorter and     Assessment:  Inflammatory capsulitis chronic forefoot pain secondary to metatarsal exposure limb length discrepancy     Plan:  H&P x-rays reviewed conditions discussed sterile prep and injected around the second MPJ bilateral 3 mg Kenalog 5 mg Xylocaine with dexamethasone and advised on anti-inflammatories and patient is casted for functional orthotics to try to provide for better limb length and take pressure off the forefoot bilateral  X-rays were negative for signs of advanced arthritis or other pathology appears to be strictly inflammatory soft tissue

## 2021-04-27 ENCOUNTER — Telehealth: Payer: Self-pay | Admitting: Adult Health

## 2021-04-27 NOTE — Telephone Encounter (Signed)
[facility] is calling to let cory know the patient does not want to be seen until next year in jan 2023. Pt will be travelling to [place] and [place]. Please advice

## 2021-04-28 NOTE — Telephone Encounter (Signed)
Noted  

## 2021-05-17 ENCOUNTER — Other Ambulatory Visit: Payer: Self-pay | Admitting: Adult Health

## 2021-05-17 DIAGNOSIS — I10 Essential (primary) hypertension: Secondary | ICD-10-CM

## 2021-05-18 ENCOUNTER — Ambulatory Visit (INDEPENDENT_AMBULATORY_CARE_PROVIDER_SITE_OTHER): Payer: Medicare Other

## 2021-05-18 DIAGNOSIS — I4430 Unspecified atrioventricular block: Secondary | ICD-10-CM

## 2021-05-18 LAB — CUP PACEART REMOTE DEVICE CHECK
Date Time Interrogation Session: 20221114081437
Implantable Lead Implant Date: 20200129
Implantable Lead Implant Date: 20200129
Implantable Lead Location: 753859
Implantable Lead Location: 753860
Implantable Lead Model: 377
Implantable Lead Model: 377
Implantable Lead Serial Number: 80947537
Implantable Lead Serial Number: 80970966
Implantable Pulse Generator Implant Date: 20200129
Pulse Gen Model: 407145
Pulse Gen Serial Number: 69503797

## 2021-05-19 ENCOUNTER — Other Ambulatory Visit: Payer: Self-pay

## 2021-05-19 ENCOUNTER — Ambulatory Visit (INDEPENDENT_AMBULATORY_CARE_PROVIDER_SITE_OTHER): Payer: Medicare Other | Admitting: Podiatrist

## 2021-05-19 DIAGNOSIS — M79671 Pain in right foot: Secondary | ICD-10-CM

## 2021-05-19 DIAGNOSIS — M79672 Pain in left foot: Secondary | ICD-10-CM

## 2021-05-19 DIAGNOSIS — M779 Enthesopathy, unspecified: Secondary | ICD-10-CM

## 2021-05-19 NOTE — Progress Notes (Signed)
Patient presents today to pick up orthotics. They are noted to contour the arch and foot nicely and fit well in the shoes.  Break in period recommended and instructions for wear given.   He will call if any concerns arise and otherwise will be seen back as needed for follow up

## 2021-05-26 NOTE — Progress Notes (Signed)
Remote pacemaker transmission.   

## 2021-06-10 ENCOUNTER — Telehealth: Payer: Self-pay | Admitting: Podiatry

## 2021-06-10 NOTE — Telephone Encounter (Signed)
Pt left message stating the orthotics he got he cannot wear and he wants to return them.  As I was looking into his record he called back and states the orthotics are causing him a lot of pain and he cannot wear them. He also stated he was told they would be covered by insurance all but about 25.00 or something like that and he got a bill around 450 and he cannot pay that.  I explained that they are custom made and cannot be returned and offered an appt for adjustment or remake and he just wants to return them and the charges taken out because he cannot afford them.  Please advise.

## 2021-06-11 NOTE — Telephone Encounter (Signed)
Called pt and let him know and he is going to bring them back. I have sent the message to Jocelyn Lamer to write it off. Pt said thank you.

## 2021-06-11 NOTE — Telephone Encounter (Signed)
I'm sure we told him they are not covered but go ahead and write them off

## 2021-06-16 ENCOUNTER — Encounter: Payer: Self-pay | Admitting: Adult Health

## 2021-06-16 ENCOUNTER — Ambulatory Visit (INDEPENDENT_AMBULATORY_CARE_PROVIDER_SITE_OTHER): Payer: Medicare Other | Admitting: Adult Health

## 2021-06-16 VITALS — BP 126/80 | HR 65 | Temp 97.1°F | Ht 68.0 in | Wt 238.0 lb

## 2021-06-16 DIAGNOSIS — J020 Streptococcal pharyngitis: Secondary | ICD-10-CM | POA: Diagnosis not present

## 2021-06-16 LAB — POCT RAPID STREP A (OFFICE): Rapid Strep A Screen: POSITIVE — AB

## 2021-06-16 MED ORDER — PENICILLIN V POTASSIUM 500 MG PO TABS: 500.0000 mg | ORAL_TABLET | Freq: Three times a day (TID) | ORAL | 0 refills | Status: AC

## 2021-06-16 NOTE — Progress Notes (Signed)
Subjective:    Patient ID: Marcus [name], male    DOB: 1940-12-29, 80 y.o.   MRN: [id number]  HPI  80 year old male who  has a past medical history of Acute meniscal tear of knee (left), Adjustment disorder with depressed mood (2019), Arthritis, AV block (08/01/2018), Benign essential hypertension (01/18/2007), Bradycardia (01/08/2011), Bronchiectasis with acute exacerbation (06/23/2020), Calcaneal spur, Chest pain, atypical (08/23/2014), Chronic cough (10/07/2014), Coronary artery disease, Cramps of lower extremity, Degeneration of lumbar intervertebral disc (10/14/2017), Diverticulosis of colon (07/07/2005), Dry eye syndrome of bilateral lacrimal glands, Echocardiogram findings abnormal, without diagnosis (04/23/2010), Edema of lower extremity, Elevated PSA (06/19/2014), Frequency of urination, GERD (gastroesophageal reflux disease) (01/18/2007), Gout, unspecified (01/18/2007), Hemorrhoids (01/19/2011), Hiatal hernia, History of colonic polyps (01/18/2007), History of malignant neoplasm of colon (1988), History of malignant neoplasm of prostate, Hyperlipidemia, Insomnia, unspecified (08/21/2007), Iron deficiency anemia, unspecified  (01/19/2011), Lumbar post-laminectomy syndrome (10/14/2017), Lumbar spondylolysis, Lumbar strain (12/14/2011), Malaria (1964), Normal nuclear stress test (04/23/2010), Obstructive sleep apnea (01/18/2007), Paraesophageal hernia, Personal history of colonic polyps (08/21/2009), Pneumonia, Postgastric surgery syndromes, Presence of permanent cardiac pacemaker, Primary osteoarthritis of knee (05/23/2015), Prostate cancer ([place]) (2016), S/P knee replacement (05/23/2015), Sensorineural hearing loss, bilateral, Skin lesion of face (07/23/2011), Ventral hernia, Vitamin B12 deficiency, and Vitamin D deficiency (10/28/2009).  He presents to the office today for an acute issue of sore throat.  Reports swollen lymph nodes especially on the right side.  Report pain when he swallows.   Denies fevers, chills, or feeling acutely ill.  Does have a hoarse voice.  Review of Systems See HPI   Past Medical History:  Diagnosis Date   Acute meniscal tear of knee left   Adjustment disorder with depressed mood 2019   related to prior passing of his wife   Arthritis    generalized   AV block 08/01/2018   Benign essential hypertension 01/18/2007   Bradycardia 01/08/2011   Bronchiectasis with acute exacerbation 06/23/2020   Calcaneal spur    Chest pain, atypical 08/23/2014   Chronic cough 10/07/2014   Coronary artery disease    Cramps of lower extremity    in the mornings, occasionally   Degeneration of lumbar intervertebral disc 10/14/2017   Diverticulosis of colon 07/07/2005   Dry eye syndrome of bilateral lacrimal glands    Echocardiogram findings abnormal, without diagnosis 04/23/2010   normal LV function, mod. left atrial enlargement, mild right artrial enlargement and grade 1 diastolic dysfunction   Edema of lower extremity    ankles, elevates legs   Elevated PSA 06/19/2014   Frequency of urination    followed by dr dalhstedt   GERD (gastroesophageal reflux disease) 01/18/2007   Gout, unspecified 01/18/2007   Hemorrhoids 01/19/2011   Hiatal hernia    History of colonic polyps 01/18/2007   History of malignant neoplasm of colon 1988   surgically removed; no radiation or chemotherapy   History of malignant neoplasm of prostate    Hyperlipidemia    Insomnia, unspecified 08/21/2007   Iron deficiency anemia, unspecified  01/19/2011   Lumbar post-laminectomy syndrome 10/14/2017   Lumbar spondylolysis    Lumbar strain 12/14/2011   Malaria 1964   Normal nuclear stress test 04/23/2010   ef 51%,  septal hypokinesis, normal perfusion   Obstructive sleep apnea 01/18/2007   uses CPAP nightly   Paraesophageal hernia    large   Personal history of colonic polyps 08/21/2009   TUBULAR ADENOMAS   Pneumonia    history of; last episode 2015  Postgastric surgery  syndromes    Presence of permanent cardiac pacemaker    Primary osteoarthritis of knee 05/23/2015   Prostate cancer ([place]) 2016   S/P knee replacement 05/23/2015   Sensorineural hearing loss, bilateral    Skin lesion of face 07/23/2011   Ventral hernia    Vitamin B12 deficiency    Vitamin D deficiency 10/28/2009    Social History   Socioeconomic History   Marital status: Widowed    Spouse name: Not on file   Number of children: 4   Years of education: 7   Highest education level: Some college, no degree  Occupational History   Occupation: retired    Fish farm manager: [employer]  Tobacco Use   Smoking status: Former    Packs/day: 2.00    Years: 20.00    Pack years: 40.00    Types: Cigarettes    Quit date: 07/05/1974    Years since quitting: 46.9   Smokeless tobacco: Never  Vaping Use   Vaping Use: Never used  Substance and Sexual Activity   Alcohol use: Not Currently    Alcohol/week: 30.0 standard drinks    Types: 30 Standard drinks or equivalent per week    Comment: 4-5 glasses of wine or 2-3 beers nightly   Drug use: No   Sexual activity: Not Currently  Other Topics Concern   Not on file  Social History Narrative   Recently widowed   Former Smoker    Alcohol use-yes 1-2 drinks per day      Occupation: Retired Midwife      Originally from [place], [place] - in [place] > 10 yrs as of 2017      Social Determinants of Health   Financial Resource Strain: Not on file  Food Insecurity: No Food Insecurity   Worried About Charity fundraiser in the Last Year: Never true   Arboriculturist in the Last Year: Never true  Transportation Needs: No Transportation Needs   Lack of Transportation (Medical): No   Lack of Transportation (Non-Medical): No  Physical Activity: Inactive   Days of Exercise per Week: 0 days   Minutes of Exercise per Session: 0 min  Stress: No Stress Concern Present   Feeling of Stress : Not at all  Social Connections: Moderately Isolated   Frequency of  Communication with Friends and Family: Three times a week   Frequency of Social Gatherings with Friends and Family: More than three times a week   Attends Religious Services: More than 4 times per year   Active Member of Clubs or Organizations: No   Attends Archivist Meetings: Never   Marital Status: Widowed  Intimate Partner Violence: Not At Risk   Fear of Current or Ex-Partner: No   Emotionally Abused: No   Physically Abused: No   Sexually Abused: No    Past Surgical History:  Procedure Laterality Date   BRONCHIAL WASHINGS  10/30/2019   Procedure: BRONCHIAL WASHINGS;  Surgeon: Julian Hy, DO;  Location: [facility];  Service: Endoscopy;;   CARDIAC CATHETERIZATION     CATARACT EXTRACTION W/ INTRAOCULAR LENS  IMPLANT, BILATERAL Bilateral    COLONOSCOPY     ESOPHAGOGASTRODUODENOSCOPY     KNEE ARTHROSCOPY Right 2007   KNEE ARTHROSCOPY  07/13/2011   Procedure: ARTHROSCOPY KNEE;  Surgeon: Cynda Familia;  Location: [facility];  Service: Orthopedics;  Laterality: Left;  partial menisectomy with chondrylplasty   KNEE SURGERY Left    LAMINECTOMY AND  MICRODISCECTOMY LUMBAR SPINE  MARCH  2008   L3 -  4   LAPAROSCOPIC INCISIONAL / UMBILICAL / VENTRAL HERNIA REPAIR  2006   PACEMAKER IMPLANT N/A 08/02/2018   Procedure: PACEMAKER IMPLANT;  Surgeon: Evans Lance, MD;  Location: [facility];  Service: Cardiovascular;  Laterality: N/A;   PROSTATE BIOPSY     RADIOACTIVE SEED IMPLANT N/A 02/12/2019   Procedure: RADIOACTIVE SEED IMPLANT/BRACHYTHERAPY IMPLANT;  Surgeon: Franchot Gallo, MD;  Location: [facility];  Service: Urology;  Laterality: N/A;  90 MINS   SHOULDER ARTHROSCOPY Right 05-23-2007   SIGMOID COLECTOMY FOR CANCER  1989   SPACE OAR INSTILLATION N/A 02/12/2019   Procedure: SPACE OAR INSTILLATION;  Surgeon: Franchot Gallo, MD;  Location: [facility];  Service: Urology;  Laterality: N/A;   TOTAL KNEE ARTHROPLASTY Left 05/23/2015   Procedure: LEFT  TOTAL KNEE ARTHROPLASTY;  Surgeon: Sydnee Cabal, MD;  Location: [facility];  Service: Orthopedics;  Laterality: Left;   VERTICAL BANDED GASTROPLASTY  1986   VIDEO BRONCHOSCOPY N/A 10/30/2019   Procedure: VIDEO BRONCHOSCOPY WITH BRONICAL ALVEROLAR LAVAGE WITHOUT FLUORO;  Surgeon: Julian Hy, DO;  [facility];  Service: Endoscopy;  Laterality: N/A;    Family History  Problem Relation Age of Onset   Stroke Paternal Grandfather    Heart disease Paternal Grandfather    Esophagitis Father        died from perforated esophagus   Breast cancer Mother    Colon cancer Maternal Grandmother        questionable   Esophageal cancer Neg Hx    Prostate cancer Neg Hx    Stomach cancer Neg Hx     Allergies  Allergen Reactions   Contrast Media [Iodinated Diagnostic Agents] Palpitations    TACHYCARDIA- patient states he has tolerated newer agents since this reaction >30 yrs ago    Current Outpatient Medications on File Prior to Visit  Medication Sig Dispense Refill   allopurinol (ZYLOPRIM) 300 MG tablet TAKE 1 TABLET BY MOUTH EVERY DAY 90 tablet 1   amLODipine (NORVASC) 10 MG tablet TAKE 1 TABLET BY MOUTH EVERY DAY 90 tablet 0   aspirin EC 81 MG tablet Take 1 tablet (81 mg total) by mouth daily. Swallow whole. 90 tablet 3   Calcium Carb-Cholecalciferol (CALCIUM 600 + D PO) Take 1 tablet by mouth daily. 800 units of vitamin D     cephALEXin (KEFLEX) 500 MG capsule 2 caps po bid x 7 days 28 capsule 0   docusate sodium (COLACE) 100 MG capsule Take 100 mg by mouth daily.      eszopiclone (LUNESTA) 2 MG TABS tablet Take 1 tablet (2 mg total) by mouth at bedtime as needed for sleep. Take immediately before bedtime 30 tablet 0   HYDROcodone-acetaminophen (NORCO/VICODIN) 5-325 MG tablet hydrocodone 5 mg-acetaminophen 325 mg tablet  Take 1 tablet 4 times a day by oral route as needed.     meclizine (ANTIVERT) 25 MG tablet Take 1 tablet (25 mg total) by mouth 3 (three) times daily as needed for  dizziness. 30 tablet 0   metoCLOPramide (REGLAN) 10 MG tablet TAKE 1 TABLET (10 MG TOTAL) BY MOUTH AT BEDTIME AS NEEDED FOR NAUSEA. 90 tablet 0   Multiple Vitamin (MULTIVITAMIN) tablet Take 1 tablet by mouth daily.     omeprazole (PRILOSEC) 40 MG capsule TAKE 1 CAPSULE BY MOUTH EVERY DAY 90 capsule 1   oxyCODONE-acetaminophen (PERCOCET/ROXICET) 5-325 MG tablet Take 1-2 tablets by mouth every 6 (six) hours as needed  for severe pain. 6 tablet 0   oxymetazoline (AFRIN) 0.05 % nasal spray Place 1 spray into both nostrils 2 (two) times daily.     potassium gluconate 595 (99 K) MG TABS tablet Take 595 mg by mouth daily.     Respiratory Therapy Supplies (FLUTTER) DEVI 1 each by Does not apply route in the morning and at bedtime. 1 each 0   tamsulosin (FLOMAX) 0.4 MG CAPS capsule Take 0.4 mg by mouth daily.     tiZANidine (ZANAFLEX) 2 MG tablet Take 1 tablet (2 mg total) by mouth every 8 (eight) hours as needed for muscle spasms. 21 tablet 0   traMADol (ULTRAM) 50 MG tablet tramadol 50 mg tablet  Take 1 tablet every 6 hours by oral route as needed.     valsartan-hydrochlorothiazide (DIOVAN-HCT) 320-25 MG tablet TAKE 1 TABLET BY MOUTH DAILY. **DUE FOR YEARLY PHYSICAL** 90 tablet 1   vitamin B-12 (CYANOCOBALAMIN) 1000 MCG tablet Take 1,000 mcg by mouth daily.      rosuvastatin (CRESTOR) 20 MG tablet Take 1 tablet (20 mg total) by mouth daily. 90 tablet 3   No current facility-administered medications on file prior to visit.    BP 126/80    Pulse 65    Temp (!) 97.1 F (36.2 C) (Temporal)    Ht 5\' 8"  (1.727 m)    Wt 238 lb (108 kg)    SpO2 97%    BMI 36.19 kg/m       Objective:   Physical Exam Constitutional:      Appearance: He is well-developed.  HENT:     Nose: Nose normal. No congestion or rhinorrhea.     Mouth/Throat:     Mouth: Mucous membranes are moist.     Pharynx: Oropharyngeal exudate and posterior oropharyngeal erythema present.     Tonsils: Tonsillar exudate present. No tonsillar  abscesses.  Cardiovascular:     Rate and Rhythm: Normal rate and regular rhythm.     Pulses: Normal pulses.     Heart sounds: Normal heart sounds.  Pulmonary:     Effort: Pulmonary effort is normal.     Breath sounds: Normal breath sounds.  Lymphadenopathy:     Head:     Right side of head: Submandibular and tonsillar adenopathy present.     Left side of head: Tonsillar adenopathy present.  Neurological:     General: No focal deficit present.     Mental Status: He is alert and oriented to person, place, and time.  Psychiatric:        Mood and Affect: Mood normal.        Behavior: Behavior normal.        Thought Content: Thought content normal.        Judgment: Judgment normal.       Assessment & Plan:   1. Strep throat  - POC Rapid Strep A- Positive - Will treat with abx therapy. Advised follow up instructions and red flags to look for.  - penicillin v potassium (VEETID) 500 MG tablet; Take 1 tablet (500 mg total) by mouth 3 (three) times daily for 10 days.  Dispense: 30 tablet; Refill: 0   Dorothyann Peng, NP

## 2021-06-30 ENCOUNTER — Ambulatory Visit: Payer: Medicare Other | Admitting: Adult Health

## 2021-07-14 ENCOUNTER — Other Ambulatory Visit: Payer: Self-pay | Admitting: Adult Health

## 2021-07-14 ENCOUNTER — Encounter: Payer: Self-pay | Admitting: Adult Health

## 2021-07-14 ENCOUNTER — Ambulatory Visit (INDEPENDENT_AMBULATORY_CARE_PROVIDER_SITE_OTHER): Payer: Medicare Other | Admitting: Adult Health

## 2021-07-14 VITALS — BP 123/78 | HR 68 | Wt 232.0 lb

## 2021-07-14 DIAGNOSIS — A09 Infectious gastroenteritis and colitis, unspecified: Secondary | ICD-10-CM | POA: Diagnosis not present

## 2021-07-14 DIAGNOSIS — L918 Other hypertrophic disorders of the skin: Secondary | ICD-10-CM | POA: Diagnosis not present

## 2021-07-14 MED ORDER — AZITHROMYCIN 250 MG PO TABS
1000.0000 mg | ORAL_TABLET | Freq: Once | ORAL | 0 refills | Status: AC
Start: 2021-07-14 — End: 2021-07-14

## 2021-07-14 MED ORDER — AZITHROMYCIN 250 MG PO TABS: 1000.0000 mg | ORAL_TABLET | Freq: Once | ORAL | 0 refills | Status: AC

## 2021-07-14 NOTE — Progress Notes (Signed)
Subjective:    Patient ID: Marcus [name], male    DOB: 18-Sep-1940, 81 y.o.   MRN: [id number]  HPI  81 year old male who is being evaluated today for 2 separate issues.  His first issue is that of acute diarrhea.  Over the holidays he was in [place] visiting friends when he started to develop diarrhea.  He started taking Imodium which helped but has not resolved his diarrhea.  Diarrhea has continued for the last 5 days.  Does not remember eating or drinking anything suspicious while in [place].  Denies bloody diarrhea or abdominal pain  His second issue is that of a skin growth on his back that has become irritated with clothing.  He is wondering if he needs to see a dermatologist or if this is something we can take care of in the office   Review of Systems See HPI   Past Medical History:  Diagnosis Date   Acute meniscal tear of knee left   Adjustment disorder with depressed mood 2019   related to prior passing of his wife   Arthritis    generalized   AV block 08/01/2018   Benign essential hypertension 01/18/2007   Bradycardia 01/08/2011   Bronchiectasis with acute exacerbation 06/23/2020   Calcaneal spur    Chest pain, atypical 08/23/2014   Chronic cough 10/07/2014   Coronary artery disease    Cramps of lower extremity    in the mornings, occasionally   Degeneration of lumbar intervertebral disc 10/14/2017   Diverticulosis of colon 07/07/2005   Dry eye syndrome of bilateral lacrimal glands    Echocardiogram findings abnormal, without diagnosis 04/23/2010   normal LV function, mod. left atrial enlargement, mild right artrial enlargement and grade 1 diastolic dysfunction   Edema of lower extremity    ankles, elevates legs   Elevated PSA 06/19/2014   Frequency of urination    followed by dr dalhstedt   GERD (gastroesophageal reflux disease) 01/18/2007   Gout, unspecified 01/18/2007   Hemorrhoids 01/19/2011   Hiatal hernia    History of colonic polyps 01/18/2007    History of malignant neoplasm of colon 1988   surgically removed; no radiation or chemotherapy   History of malignant neoplasm of prostate    Hyperlipidemia    Insomnia, unspecified 08/21/2007   Iron deficiency anemia, unspecified  01/19/2011   Lumbar post-laminectomy syndrome 10/14/2017   Lumbar spondylolysis    Lumbar strain 12/14/2011   Malaria 1964   Normal nuclear stress test 04/23/2010   ef 51%,  septal hypokinesis, normal perfusion   Obstructive sleep apnea 01/18/2007   uses CPAP nightly   Paraesophageal hernia    large   Personal history of colonic polyps 08/21/2009   TUBULAR ADENOMAS   Pneumonia    history of; last episode 2015   Postgastric surgery syndromes    Presence of permanent cardiac pacemaker    Primary osteoarthritis of knee 05/23/2015   Prostate cancer ([facility]) 2016   S/P knee replacement 05/23/2015   Sensorineural hearing loss, bilateral    Skin lesion of face 07/23/2011   Ventral hernia    Vitamin B12 deficiency    Vitamin D deficiency 10/28/2009    Social History   Socioeconomic History   Marital status: Widowed    Spouse name: Not on file   Number of children: 4   Years of education: 84   Highest education level: Some college, no degree  Occupational History   Occupation: retired    Fish farm manager: [employer]  Tobacco Use   Smoking status: Former    Packs/day: 2.00    Years: 20.00    Pack years: 40.00    Types: Cigarettes    Quit date: 07/05/1974    Years since quitting: 47.0   Smokeless tobacco: Never  Vaping Use   Vaping Use: Never used  Substance and Sexual Activity   Alcohol use: Not Currently    Alcohol/week: 30.0 standard drinks    Types: 30 Standard drinks or equivalent per week    Comment: 4-5 glasses of wine or 2-3 beers nightly   Drug use: No   Sexual activity: Not Currently  Other Topics Concern   Not on file  Social History Narrative   Recently widowed   Former Smoker    Alcohol use-yes 1-2 drinks per day       Occupation: Retired Midwife      Originally from [place], [place] - in [place] > 10 yrs as of 2017      Social Determinants of Health   Financial Resource Strain: Not on file  Food Insecurity: No Food Insecurity   Worried About Charity fundraiser in the Last Year: Never true   Arboriculturist in the Last Year: Never true  Transportation Needs: No Transportation Needs   Lack of Transportation (Medical): No   Lack of Transportation (Non-Medical): No  Physical Activity: Inactive   Days of Exercise per Week: 0 days   Minutes of Exercise per Session: 0 min  Stress: No Stress Concern Present   Feeling of Stress : Not at all  Social Connections: Moderately Isolated   Frequency of Communication with Friends and Family: Three times a week   Frequency of Social Gatherings with Friends and Family: More than three times a week   Attends Religious Services: More than 4 times per year   Active Member of Clubs or Organizations: No   Attends Archivist Meetings: Never   Marital Status: Widowed  Intimate Partner Violence: Not At Risk   Fear of Current or Ex-Partner: No   Emotionally Abused: No   Physically Abused: No   Sexually Abused: No    Past Surgical History:  Procedure Laterality Date   BRONCHIAL WASHINGS  10/30/2019   Procedure: BRONCHIAL WASHINGS;  Surgeon: Julian Hy, DO;  [facility]: [facility];  Service: Endoscopy;;   CARDIAC CATHETERIZATION     CATARACT EXTRACTION W/ INTRAOCULAR LENS  IMPLANT, BILATERAL Bilateral    COLONOSCOPY     ESOPHAGOGASTRODUODENOSCOPY     KNEE ARTHROSCOPY Right 2007   KNEE ARTHROSCOPY  07/13/2011   Procedure: ARTHROSCOPY KNEE;  Surgeon: Cynda Familia;  Location: [facility];  Service: Orthopedics;  Laterality: Left;  partial menisectomy with chondrylplasty   KNEE SURGERY Left    LAMINECTOMY AND MICRODISCECTOMY LUMBAR SPINE  MARCH  2008   L3 -  4   LAPAROSCOPIC INCISIONAL / UMBILICAL / VENTRAL HERNIA REPAIR  2006    PACEMAKER IMPLANT N/A 08/02/2018   Procedure: PACEMAKER IMPLANT;  Surgeon: Evans Lance, MD;  Location: [facility];  Service: Cardiovascular;  Laterality: N/A;   PROSTATE BIOPSY     RADIOACTIVE SEED IMPLANT N/A 02/12/2019   Procedure: RADIOACTIVE SEED IMPLANT/BRACHYTHERAPY IMPLANT;  Surgeon: Franchot Gallo, MD;  Location: [facility];  Service: Urology;  Laterality: N/A;  90 MINS   SHOULDER ARTHROSCOPY Right 05-23-2007   SIGMOID COLECTOMY FOR CANCER  1989   SPACE OAR INSTILLATION N/A 02/12/2019   Procedure: SPACE OAR INSTILLATION;  Surgeon: Franchot Gallo, MD;  [facility];  Service: Urology;  Laterality: N/A;   TOTAL KNEE ARTHROPLASTY Left 05/23/2015   Procedure: LEFT TOTAL KNEE ARTHROPLASTY;  Surgeon: Sydnee Cabal, MD;  [facility];  Service: Orthopedics;  Laterality: Left;   VERTICAL BANDED GASTROPLASTY  1986   VIDEO BRONCHOSCOPY N/A 10/30/2019   Procedure: VIDEO BRONCHOSCOPY WITH BRONICAL ALVEROLAR LAVAGE WITHOUT FLUORO;  Surgeon: Julian Hy, DO;  [facility];  Service: Endoscopy;  Laterality: N/A;    Family History  Problem Relation Age of Onset   Stroke Paternal Grandfather    Heart disease Paternal Grandfather    Esophagitis Father        died from perforated esophagus   Breast cancer Mother    Colon cancer Maternal Grandmother        questionable   Esophageal cancer Neg Hx    Prostate cancer Neg Hx    Stomach cancer Neg Hx     Allergies  Allergen Reactions   Contrast Media [Iodinated Contrast Media] Palpitations    TACHYCARDIA- patient states he has tolerated newer agents since this reaction >30 yrs ago    Current Outpatient Medications on File Prior to Visit  Medication Sig Dispense Refill   allopurinol (ZYLOPRIM) 300 MG tablet TAKE 1 TABLET BY MOUTH EVERY DAY 90 tablet 1   amLODipine (NORVASC) 10 MG tablet TAKE 1 TABLET BY MOUTH EVERY DAY 90 tablet 0   aspirin EC 81 MG tablet Take 1 tablet (81 mg total) by mouth daily. Swallow  whole. 90 tablet 3   Calcium Carb-Cholecalciferol (CALCIUM 600 + D PO) Take 1 tablet by mouth daily. 800 units of vitamin D     cephALEXin (KEFLEX) 500 MG capsule 2 caps po bid x 7 days 28 capsule 0   docusate sodium (COLACE) 100 MG capsule Take 100 mg by mouth daily.      eszopiclone (LUNESTA) 2 MG TABS tablet Take 1 tablet (2 mg total) by mouth at bedtime as needed for sleep. Take immediately before bedtime 30 tablet 0   HYDROcodone-acetaminophen (NORCO/VICODIN) 5-325 MG tablet hydrocodone 5 mg-acetaminophen 325 mg tablet  Take 1 tablet 4 times a day by oral route as needed.     meclizine (ANTIVERT) 25 MG tablet Take 1 tablet (25 mg total) by mouth 3 (three) times daily as needed for dizziness. 30 tablet 0   metoCLOPramide (REGLAN) 10 MG tablet TAKE 1 TABLET (10 MG TOTAL) BY MOUTH AT BEDTIME AS NEEDED FOR NAUSEA. 90 tablet 0   Multiple Vitamin (MULTIVITAMIN) tablet Take 1 tablet by mouth daily.     omeprazole (PRILOSEC) 40 MG capsule TAKE 1 CAPSULE BY MOUTH EVERY DAY 90 capsule 1   oxyCODONE-acetaminophen (PERCOCET/ROXICET) 5-325 MG tablet Take 1-2 tablets by mouth every 6 (six) hours as needed for severe pain. 6 tablet 0   oxymetazoline (AFRIN) 0.05 % nasal spray Place 1 spray into both nostrils 2 (two) times daily.     potassium gluconate 595 (99 K) MG TABS tablet Take 595 mg by mouth daily.     Respiratory Therapy Supplies (FLUTTER) DEVI 1 each by Does not apply route in the morning and at bedtime. 1 each 0   rosuvastatin (CRESTOR) 20 MG tablet Take 1 tablet (20 mg total) by mouth daily. 90 tablet 3   tamsulosin (FLOMAX) 0.4 MG CAPS capsule Take 0.4 mg by mouth daily.     tiZANidine (ZANAFLEX) 2 MG tablet Take 1 tablet (2 mg total) by mouth every 8 (eight) hours  as needed for muscle spasms. 21 tablet 0   traMADol (ULTRAM) 50 MG tablet tramadol 50 mg tablet  Take 1 tablet every 6 hours by oral route as needed.     valsartan-hydrochlorothiazide (DIOVAN-HCT) 320-25 MG tablet TAKE 1 TABLET BY  MOUTH DAILY. **DUE FOR YEARLY PHYSICAL** 90 tablet 1   vitamin B-12 (CYANOCOBALAMIN) 1000 MCG tablet Take 1,000 mcg by mouth daily.      No current facility-administered medications on file prior to visit.    BP 123/78    Pulse 68    Wt 232 lb (105.2 kg)    SpO2 97%    BMI 35.28 kg/m       Objective:   Physical Exam Vitals and nursing note reviewed.  Constitutional:      Appearance: Normal appearance.  Abdominal:     General: Abdomen is flat. Bowel sounds are normal.     Palpations: Abdomen is soft. There is no mass.  Skin:    General: Skin is warm and dry.     Capillary Refill: Capillary refill takes less than 2 seconds.     Comments: Large skin tag noted on right upper back.  No other abnormal skin growths noted  Neurological:     General: No focal deficit present.     Mental Status: He is alert and oriented to person, place, and time.  Psychiatric:        Mood and Affect: Mood normal.        Behavior: Behavior normal.        Thought Content: Thought content normal.      Assessment & Plan:  1. Traveler's diarrhea  - azithromycin (ZITHROMAX Z-PAK) 250 MG tablet; Take 4 tablets (1,000 mg total) by mouth once for 1 dose. Take 2 tablets on Day 1.  Then take 1 tablet daily.  Dispense: 6 tablet; Refill: 0  2. Skin tag Procedure: Skin tag removal Informed consent:  Discussed risks (permanent scarring, infection, pain, bleeding, bruising, redness, and recurrence of the lesion) and benefits of the procedure, as well as the alternatives.  He is aware that skin tags are benign lesions, and their removal is often not considered medically necessary.  Informed consent was obtained. Anesthesia: topical cold spray  The area was prepared and draped in a standard fashion. Snip removal was performed.   Antibiotic ointment and a sterile dressing were applied.   The patient tolerated procedure well. The patient was instructed on post-op care.   Number of lesions removed:  1   Dorothyann Peng, NP

## 2021-07-14 NOTE — Addendum Note (Signed)
Addended by: Apolinar Junes on: 07/14/2021 12:13 PM   Modules accepted: Orders

## 2021-07-28 ENCOUNTER — Ambulatory Visit (INDEPENDENT_AMBULATORY_CARE_PROVIDER_SITE_OTHER): Payer: Medicare Other

## 2021-07-28 VITALS — Ht 69.0 in | Wt 230.0 lb

## 2021-07-28 DIAGNOSIS — Z0000 Encounter for general adult medical examination without abnormal findings: Secondary | ICD-10-CM | POA: Diagnosis not present

## 2021-07-28 NOTE — Patient Instructions (Signed)
Marcus [name] , Thank you for taking time to come for your Medicare Wellness Visit. I appreciate your ongoing commitment to your health goals. Please review the following plan we discussed and let me know if I can assist you in the future.   Screening recommendations/referrals: Colonoscopy: not required Recommended yearly ophthalmology/optometry visit for glaucoma screening and checkup Recommended yearly dental visit for hygiene and checkup  Vaccinations: Influenza vaccine: decline Pneumococcal vaccine: completed 03/03/2017 Tdap vaccine: completed 03/24/2021, due 03/25/2031 Shingles vaccine: completed   Covid-19:  07/03/2020, 09/11/2019, 08/13/2019  Advanced directives: Advance directive discussed with you today.   Conditions/risks identified: none  Next appointment: Follow up in one year for your annual wellness visit.   Preventive Care 38 Years and Older, Male Preventive care refers to lifestyle choices and visits with your health care provider that can promote health and wellness. What does preventive care include? A yearly physical exam. This is also called an annual well check. Dental exams once or twice a year. Routine eye exams. Ask your health care provider how often you should have your eyes checked. Personal lifestyle choices, including: Daily care of your teeth and gums. Regular physical activity. Eating a healthy diet. Avoiding tobacco and drug use. Limiting alcohol use. Practicing safe sex. Taking low doses of aspirin every day. Taking vitamin and mineral supplements as recommended by your health care provider. What happens during an annual well check? The services and screenings done by your health care provider during your annual well check will depend on your age, overall health, lifestyle risk factors, and family history of disease. Counseling  Your health care provider may ask you questions about your: Alcohol use. Tobacco use. Drug use. Emotional well-being. Home  and relationship well-being. Sexual activity. Eating habits. History of falls. Memory and ability to understand (cognition). Work and work Statistician. Screening  You may have the following tests or measurements: Height, weight, and BMI. Blood pressure. Lipid and cholesterol levels. These may be checked every 5 years, or more frequently if you are over 82 years old. Skin check. Lung cancer screening. You may have this screening every year starting at age 82 if you have a 30-pack-year history of smoking and currently smoke or have quit within the past 15 years. Fecal occult blood test (FOBT) of the stool. You may have this test every year starting at age 82. Flexible sigmoidoscopy or colonoscopy. You may have a sigmoidoscopy every 5 years or a colonoscopy every 10 years starting at age 82. Prostate cancer screening. Recommendations will vary depending on your family history and other risks. Hepatitis C blood test. Hepatitis B blood test. Sexually transmitted disease (STD) testing. Diabetes screening. This is done by checking your blood sugar (glucose) after you have not eaten for a while (fasting). You may have this done every 1-3 years. Abdominal aortic aneurysm (AAA) screening. You may need this if you are a current or former smoker. Osteoporosis. You may be screened starting at age 82 if you are at high risk. Talk with your health care provider about your test results, treatment options, and if necessary, the need for more tests. Vaccines  Your health care provider may recommend certain vaccines, such as: Influenza vaccine. This is recommended every year. Tetanus, diphtheria, and acellular pertussis (Tdap, Td) vaccine. You may need a Td booster every 10 years. Zoster vaccine. You may need this after age 82. Pneumococcal 13-valent conjugate (PCV13) vaccine. One dose is recommended after age 82. Pneumococcal polysaccharide (PPSV23) vaccine. One dose is recommended after  age 82. Talk to  your health care provider about which screenings and vaccines you need and how often you need them. This information is not intended to replace advice given to you by your health care provider. Make sure you discuss any questions you have with your health care provider. Document Released: 07/18/2015 Document Revised: 03/10/2016 Document Reviewed: 04/22/2015 Elsevier Interactive Patient Education  2017 [place] Prevention in the Home Falls can cause injuries. They can happen to people of all ages. There are many things you can do to make your home safe and to help prevent falls. What can I do on the outside of my home? Regularly fix the edges of walkways and driveways and fix any cracks. Remove anything that might make you trip as you walk through a door, such as a raised step or threshold. Trim any bushes or trees on the path to your home. Use bright outdoor lighting. Clear any walking paths of anything that might make someone trip, such as rocks or tools. Regularly check to see if handrails are loose or broken. Make sure that both sides of any steps have handrails. Any raised decks and porches should have guardrails on the edges. Have any leaves, snow, or ice cleared regularly. Use sand or salt on walking paths during winter. Clean up any spills in your garage right away. This includes oil or grease spills. What can I do in the bathroom? Use night lights. Install grab bars by the toilet and in the tub and shower. Do not use towel bars as grab bars. Use non-skid mats or decals in the tub or shower. If you need to sit down in the shower, use a plastic, non-slip stool. Keep the floor dry. Clean up any water that spills on the floor as soon as it happens. Remove soap buildup in the tub or shower regularly. Attach bath mats securely with double-sided non-slip rug tape. Do not have throw rugs and other things on the floor that can make you trip. What can I do in the bedroom? Use  night lights. Make sure that you have a light by your bed that is easy to reach. Do not use any sheets or blankets that are too big for your bed. They should not hang down onto the floor. Have a firm chair that has side arms. You can use this for support while you get dressed. Do not have throw rugs and other things on the floor that can make you trip. What can I do in the kitchen? Clean up any spills right away. Avoid walking on wet floors. Keep items that you use a lot in easy-to-reach places. If you need to reach something above you, use a strong step stool that has a grab bar. Keep electrical cords out of the way. Do not use floor polish or wax that makes floors slippery. If you must use wax, use non-skid floor wax. Do not have throw rugs and other things on the floor that can make you trip. What can I do with my stairs? Do not leave any items on the stairs. Make sure that there are handrails on both sides of the stairs and use them. Fix handrails that are broken or loose. Make sure that handrails are as long as the stairways. Check any carpeting to make sure that it is firmly attached to the stairs. Fix any carpet that is loose or worn. Avoid having throw rugs at the top or bottom of the stairs. If you do  have throw rugs, attach them to the floor with carpet tape. Make sure that you have a light switch at the top of the stairs and the bottom of the stairs. If you do not have them, ask someone to add them for you. What else can I do to help prevent falls? Wear shoes that: Do not have high heels. Have rubber bottoms. Are comfortable and fit you well. Are closed at the toe. Do not wear sandals. If you use a stepladder: Make sure that it is fully opened. Do not climb a closed stepladder. Make sure that both sides of the stepladder are locked into place. Ask someone to hold it for you, if possible. Clearly mark and make sure that you can see: Any grab bars or handrails. First and last  steps. Where the edge of each step is. Use tools that help you move around (mobility aids) if they are needed. These include: Canes. Walkers. Scooters. Crutches. Turn on the lights when you go into a dark area. Replace any light bulbs as soon as they burn out. Set up your furniture so you have a clear path. Avoid moving your furniture around. If any of your floors are uneven, fix them. If there are any pets around you, be aware of where they are. Review your medicines with your doctor. Some medicines can make you feel dizzy. This can increase your chance of falling. Ask your doctor what other things that you can do to help prevent falls. This information is not intended to replace advice given to you by your health care provider. Make sure you discuss any questions you have with your health care provider. Document Released: 04/17/2009 Document Revised: 11/27/2015 Document Reviewed: 07/26/2014 Elsevier Interactive Patient Education  2017 [employer].

## 2021-07-28 NOTE — Progress Notes (Signed)
I connected with Marcus [name] today by telephone and verified that I am speaking with the correct person using two identifiers. Location patient: home Location provider: work Persons participating in the virtual visit: Conard, Alvira LPN.   I discussed the limitations, risks, security and privacy concerns of performing an evaluation and management service by telephone and the availability of in person appointments. I also discussed with the patient that there may be a patient responsible charge related to this service. The patient expressed understanding and verbally consented to this telephonic visit.    Interactive audio and video telecommunications were attempted between this provider and patient, however failed, due to patient having technical difficulties OR patient did not have access to video capability.  We continued and completed visit with audio only.     Vital signs may be patient reported or missing.  Subjective:   Marcus [name] is a 81 y.o. male who presents for Medicare Annual/Subsequent preventive examination.  Review of Systems     Cardiac Risk Factors include: advanced age (>93men, >59 women);hypertension;male gender;dyslipidemia     Objective:    Today's Vitals   07/28/21 0956  Weight: 230 lb (104.3 kg)  Height: 5\' 9"  (1.753 m)   Body mass index is 33.97 kg/m.  Advanced Directives 07/28/2021 03/24/2021 08/04/2020 10/30/2019 10/19/2019 03/09/2019 02/12/2019  Does Patient Have a Medical Advance Directive? No Yes No No No Yes Yes  Type of Advance Directive - Living will - - - Living will [place];Living will  Does patient want to make changes to medical advance directive? - - - - - - No - Patient declined  Copy of [place] in Chart? - - - - - - No - copy requested  Would patient like information on creating a medical advance directive? - - No - Patient declined Yes (MAU/Ambulatory/Procedural Areas - Information  given) No - Patient declined - -    Current Medications (verified) Outpatient Encounter Medications as of 07/28/2021  Medication Sig   allopurinol (ZYLOPRIM) 300 MG tablet TAKE 1 TABLET BY MOUTH EVERY DAY   amLODipine (NORVASC) 10 MG tablet TAKE 1 TABLET BY MOUTH EVERY DAY   aspirin EC 81 MG tablet Take 1 tablet (81 mg total) by mouth daily. Swallow whole.   Calcium Carb-Cholecalciferol (CALCIUM 600 + D PO) Take 1 tablet by mouth daily. 800 units of vitamin D   cephALEXin (KEFLEX) 500 MG capsule 2 caps po bid x 7 days   docusate sodium (COLACE) 100 MG capsule Take 100 mg by mouth daily.    eszopiclone (LUNESTA) 2 MG TABS tablet Take 1 tablet (2 mg total) by mouth at bedtime as needed for sleep. Take immediately before bedtime   HYDROcodone-acetaminophen (NORCO/VICODIN) 5-325 MG tablet hydrocodone 5 mg-acetaminophen 325 mg tablet  Take 1 tablet 4 times a day by oral route as needed.   meclizine (ANTIVERT) 25 MG tablet Take 1 tablet (25 mg total) by mouth 3 (three) times daily as needed for dizziness.   metoCLOPramide (REGLAN) 10 MG tablet TAKE 1 TABLET (10 MG TOTAL) BY MOUTH AT BEDTIME AS NEEDED FOR NAUSEA.   Multiple Vitamin (MULTIVITAMIN) tablet Take 1 tablet by mouth daily.   omeprazole (PRILOSEC) 40 MG capsule TAKE 1 CAPSULE BY MOUTH EVERY DAY   oxyCODONE-acetaminophen (PERCOCET/ROXICET) 5-325 MG tablet Take 1-2 tablets by mouth every 6 (six) hours as needed for severe pain.   oxymetazoline (AFRIN) 0.05 % nasal spray Place 1 spray into both nostrils 2 (two)  times daily.   potassium gluconate 595 (99 K) MG TABS tablet Take 595 mg by mouth daily.   Respiratory Therapy Supplies (FLUTTER) DEVI 1 each by Does not apply route in the morning and at bedtime.   tamsulosin (FLOMAX) 0.4 MG CAPS capsule Take 0.4 mg by mouth daily.   tiZANidine (ZANAFLEX) 2 MG tablet Take 1 tablet (2 mg total) by mouth every 8 (eight) hours as needed for muscle spasms.   traMADol (ULTRAM) 50 MG tablet tramadol 50 mg  tablet  Take 1 tablet every 6 hours by oral route as needed.   valsartan-hydrochlorothiazide (DIOVAN-HCT) 320-25 MG tablet TAKE 1 TABLET BY MOUTH DAILY. **DUE FOR YEARLY PHYSICAL**   vitamin B-12 (CYANOCOBALAMIN) 1000 MCG tablet Take 1,000 mcg by mouth daily.    rosuvastatin (CRESTOR) 20 MG tablet Take 1 tablet (20 mg total) by mouth daily.   No facility-administered encounter medications on file as of 07/28/2021.    Allergies (verified) Contrast media [iodinated contrast media]   History: Past Medical History:  Diagnosis Date   Acute meniscal tear of knee left   Adjustment disorder with depressed mood 2019   related to prior passing of his wife   Arthritis    generalized   AV block 08/01/2018   Benign essential hypertension 01/18/2007   Bradycardia 01/08/2011   Bronchiectasis with acute exacerbation 06/23/2020   Calcaneal spur    Chest pain, atypical 08/23/2014   Chronic cough 10/07/2014   Coronary artery disease    Cramps of lower extremity    in the mornings, occasionally   Degeneration of lumbar intervertebral disc 10/14/2017   Diverticulosis of colon 07/07/2005   Dry eye syndrome of bilateral lacrimal glands    Echocardiogram findings abnormal, without diagnosis 04/23/2010   normal LV function, mod. left atrial enlargement, mild right artrial enlargement and grade 1 diastolic dysfunction   Edema of lower extremity    ankles, elevates legs   Elevated PSA 06/19/2014   Frequency of urination    followed by dr dalhstedt   GERD (gastroesophageal reflux disease) 01/18/2007   Gout, unspecified 01/18/2007   Hemorrhoids 01/19/2011   Hiatal hernia    History of colonic polyps 01/18/2007   History of malignant neoplasm of colon 1988   surgically removed; no radiation or chemotherapy   History of malignant neoplasm of prostate    Hyperlipidemia    Insomnia, unspecified 08/21/2007   Iron deficiency anemia, unspecified  01/19/2011   Lumbar post-laminectomy syndrome 10/14/2017    Lumbar spondylolysis    Lumbar strain 12/14/2011   Malaria 1964   Normal nuclear stress test 04/23/2010   ef 51%,  septal hypokinesis, normal perfusion   Obstructive sleep apnea 01/18/2007   uses CPAP nightly   Paraesophageal hernia    large   Personal history of colonic polyps 08/21/2009   TUBULAR ADENOMAS   Pneumonia    history of; last episode 2015   Postgastric surgery syndromes    Presence of permanent cardiac pacemaker    Primary osteoarthritis of knee 05/23/2015   Prostate cancer ([place]) 2016   S/P knee replacement 05/23/2015   Sensorineural hearing loss, bilateral    Skin lesion of face 07/23/2011   Ventral hernia    Vitamin B12 deficiency    Vitamin D deficiency 10/28/2009   Past Surgical History:  Procedure Laterality Date   BRONCHIAL WASHINGS  10/30/2019   Procedure: BRONCHIAL WASHINGS;  Surgeon: Julian Hy, DO;  Location: [facility];  Service: Endoscopy;;   CARDIAC CATHETERIZATION  CATARACT EXTRACTION W/ INTRAOCULAR LENS  IMPLANT, BILATERAL Bilateral    COLONOSCOPY     ESOPHAGOGASTRODUODENOSCOPY     KNEE ARTHROSCOPY Right 2007   KNEE ARTHROSCOPY  07/13/2011   Procedure: ARTHROSCOPY KNEE;  Surgeon: Cynda Familia;  Location: [facility];  Service: Orthopedics;  Laterality: Left;  partial menisectomy with chondrylplasty   KNEE SURGERY Left    LAMINECTOMY AND MICRODISCECTOMY LUMBAR SPINE  MARCH  2008   L3 -  4   LAPAROSCOPIC INCISIONAL / UMBILICAL / VENTRAL HERNIA REPAIR  2006   PACEMAKER IMPLANT N/A 08/02/2018   Procedure: PACEMAKER IMPLANT;  Surgeon: Evans Lance, MD;  Location: [facility];  Service: Cardiovascular;  Laterality: N/A;   PROSTATE BIOPSY     RADIOACTIVE SEED IMPLANT N/A 02/12/2019   Procedure: RADIOACTIVE SEED IMPLANT/BRACHYTHERAPY IMPLANT;  Surgeon: Franchot Gallo, MD;  Location: [facility];  Service: Urology;  Laterality: N/A;  90 MINS   SHOULDER ARTHROSCOPY Right 05-23-2007   SIGMOID COLECTOMY FOR CANCER   1989   SPACE OAR INSTILLATION N/A 02/12/2019   Procedure: SPACE OAR INSTILLATION;  Surgeon: Franchot Gallo, MD;  Location: [facility];  Service: Urology;  Laterality: N/A;   TOTAL KNEE ARTHROPLASTY Left 05/23/2015   Procedure: LEFT TOTAL KNEE ARTHROPLASTY;  Surgeon: Sydnee Cabal, MD;  Location: [facility];  Service: Orthopedics;  Laterality: Left;   VERTICAL BANDED GASTROPLASTY  1986   VIDEO BRONCHOSCOPY N/A 10/30/2019   Procedure: VIDEO BRONCHOSCOPY WITH BRONICAL ALVEROLAR LAVAGE WITHOUT FLUORO;  Surgeon: Julian Hy, DO;  Location[facility];  Service: Endoscopy;  Laterality: N/A;   Family History  Problem Relation Age of Onset   Stroke Paternal Grandfather    Heart disease Paternal Grandfather    Esophagitis Father        died from perforated esophagus   Breast cancer Mother    Colon cancer Maternal Grandmother        questionable   Esophageal cancer Neg Hx    Prostate cancer Neg Hx    Stomach cancer Neg Hx    Social History   Socioeconomic History   Marital status: Widowed    Spouse name: Not on file   Number of children: 4   Years of education: 51   Highest education level: Some college, no degree  Occupational History   Occupation: retired    Fish farm manager: [employer]  Tobacco Use   Smoking status: Former    Packs/day: 2.00    Years: 20.00    Pack years: 40.00    Types: Cigarettes    Quit date: 07/05/1974    Years since quitting: 47.0   Smokeless tobacco: Never  Vaping Use   Vaping Use: Never used  Substance and Sexual Activity   Alcohol use: Not Currently    Alcohol/week: 30.0 standard drinks    Types: 30 Standard drinks or equivalent per week    Comment: 4-5 glasses of wine or 2-3 beers nightly   Drug use: No   Sexual activity: Not Currently  Other Topics Concern   Not on file  Social History Narrative   Recently widowed   Former Smoker    Alcohol use-yes 1-2 drinks per day      Occupation: Retired Midwife      Originally from [place], [place] - in [place]  > 10 yrs as of 2017      Social Determinants of Health   Financial Resource Strain: Low Risk    Difficulty of Paying Living Expenses: Not hard  at all  Food Insecurity: No Food Insecurity   Worried About Charity fundraiser in the Last Year: Never true   Ran Out of Food in the Last Year: Never true  Transportation Needs: No Transportation Needs   Lack of Transportation (Medical): No   Lack of Transportation (Non-Medical): No  Physical Activity: Inactive   Days of Exercise per Week: 0 days   Minutes of Exercise per Session: 0 min  Stress: No Stress Concern Present   Feeling of Stress : Not at all  Social Connections: Moderately Isolated   Frequency of Communication with Friends and Family: Three times a week   Frequency of Social Gatherings with Friends and Family: More than three times a week   Attends Religious Services: More than 4 times per year   Active Member of Clubs or Organizations: No   Attends Archivist Meetings: Never   Marital Status: Widowed    Tobacco Counseling Counseling given: Not Answered   Clinical Intake:  Pre-visit preparation completed: Yes  Pain : No/denies pain     Nutritional Status: BMI > 30  Obese Nutritional Risks: None Diabetes: No  How often do you need to have someone help you when you read instructions, pamphlets, or other written materials from your doctor or pharmacy?: 1 - Never  Diabetic? no  Interpreter Needed?: No  Information entered by :: NAllen LPN   Activities of Daily Living In your present state of health, do you have any difficulty performing the following activities: 07/28/2021 08/04/2020  Hearing? Y Y  Comment - Patient states wears bilateral hearing aids  Vision? N N  Difficulty concentrating or making decisions? N N  Walking or climbing stairs? N N  Dressing or bathing? N N  Doing errands, shopping? N N  Preparing Food and eating ? N N  Using the Toilet? N N  In the past six months, have you  accidently leaked urine? N N  Do you have problems with loss of bowel control? N N  Managing your Medications? N N  Managing your Finances? N N  Housekeeping or managing your Housekeeping? N N  Some recent data might be hidden    Patient Care Team: Dorothyann Peng, NP as PCP - General (Family Medicine) Franchot Gallo, MD as Consulting Physician (Urology) Suella Broad, MD as Consulting Physician (Physical Medicine and Rehabilitation) Melina Schools, MD as Consulting Physician (Orthopedic Surgery)  Indicate any recent Medical Services you may have received from other than [facility] providers in the past year (date may be approximate).     Assessment:   This is a routine wellness examination for Marcus [name].  Hearing/Vision screen Vision Screening - Comments:: Regular eye exam, Dr. Valma Cava  Dietary issues and exercise activities discussed: Current Exercise Habits: The patient does not participate in regular exercise at present   Goals Addressed             This Visit's Progress    Patient Stated       07/28/2021, lose weight       Depression Screen PHQ 2/9 Scores 07/28/2021 06/16/2021 08/04/2020 03/05/2020 03/24/2018 08/07/2015 10/31/2013  PHQ - 2 Score 0 2 6 1  0 0 0  PHQ- 9 Score - 11 15 - - - -    Fall Risk Fall Risk  07/28/2021 08/04/2020 03/05/2020 03/24/2018 08/07/2015  Falls in the past year? 0 0 1 No No  Number falls in past yr: - 0 1 - -  Injury with Fall? - 0 0 - -  Risk for fall due to : Medication side effect No Fall Risks History of fall(s) - -  Follow up Falls evaluation completed;Education provided;Falls prevention discussed Falls evaluation completed;Falls prevention discussed Falls evaluation completed - -    FALL RISK PREVENTION PERTAINING TO THE HOME:  Any stairs in or around the home? Yes  If so, are there any without handrails? No  Home free of loose throw rugs in walkways, pet beds, electrical cords, etc? Yes  Adequate lighting in your home to reduce risk of  falls? Yes   ASSISTIVE DEVICES UTILIZED TO PREVENT FALLS:  Life alert? No  Use of a cane, walker or w/c? No  Grab bars in the bathroom? Yes  Shower chair or bench in shower? Yes  Elevated toilet seat or a handicapped toilet? Yes   TIMED UP AND GO:  Was the test performed? No .      Cognitive Function: MMSE - Mini Mental State Exam 03/05/2020  Orientation to time 5  Orientation to Place 5  Registration 3  Attention/ Calculation 5  Recall 2  Language- name 2 objects 2  Language- repeat 1  Language- follow 3 step command 3  Language- read & follow direction 1  Write a sentence 1  Copy design 1  Total score 29        Immunizations Immunization History  Administered Date(s) Administered   Influenza Whole 06/05/2007, 04/30/2008, 03/31/2009   Influenza, High Dose Seasonal PF 06/19/2014, 07/30/2015, 03/03/2017   Influenza,inj,quad, With Preservative 04/04/2017   Moderna Sars-Covid-2 Vaccination 07/03/2020   PFIZER(Purple Top)SARS-COV-2 Vaccination 08/13/2019, 09/11/2019   Pneumococcal Conjugate-13 06/19/2014, 07/30/2015   Pneumococcal Polysaccharide-23 12/11/2003, 03/03/2017   Td 03/31/2009   Tdap 10/19/2019, 03/24/2021   Zoster Recombinat (Shingrix) 07/17/2020    TDAP status: Up to date  Flu Vaccine status: Declined, Education has been provided regarding the importance of this vaccine but patient still declined. Advised may receive this vaccine at local pharmacy or Health Dept. Aware to provide a copy of the vaccination record if obtained from local pharmacy or Health Dept. Verbalized acceptance and understanding.  Pneumococcal vaccine status: Up to date  Covid-19 vaccine status: Completed vaccines  Qualifies for Shingles Vaccine? Yes   Zostavax completed No   Shingrix Completed?: Yes  Screening Tests Health Maintenance  Topic Date Due   COLONOSCOPY (Pts 45-32yrs Insurance coverage will need to be confirmed)  11/13/2017   COVID-19 Vaccine (4 - Booster)  08/28/2020   INFLUENZA VACCINE  10/02/2021 (Originally 02/02/2021)   Pneumonia Vaccine 67+ Years old  Completed   Zoster Vaccines- Shingrix  Completed   HPV VACCINES  Aged Out    Health Maintenance  Health Maintenance Due  Topic Date Due   COLONOSCOPY (Pts 45-48yrs Insurance coverage will need to be confirmed)  11/13/2017   COVID-19 Vaccine (4 - Booster) 08/28/2020    Colorectal cancer screening: No longer required.   Lung Cancer Screening: (Low Dose CT Chest recommended if Age 12-80 years, 30 pack-year currently smoking OR have quit w/in 15years.) does not qualify.   Lung Cancer Screening Referral: no  Additional Screening:  Hepatitis C Screening: does not qualify;  Vision Screening: Recommended annual ophthalmology exams for early detection of glaucoma and other disorders of the eye. Is the patient up to date with their annual eye exam?  Yes  Who is the provider or what is the name of the office in which the patient attends annual eye exams? Dr. Valma Cava If pt is not established with a provider, would they  like to be referred to a provider to establish care? No .   Dental Screening: Recommended annual dental exams for proper oral hygiene  Community Resource Referral / Chronic Care Management: CRR required this visit?  No   CCM required this visit?  No      Plan:     I have personally reviewed and noted the following in the patients chart:   Medical and social history Use of alcohol, tobacco or illicit drugs  Current medications and supplements including opioid prescriptions. Patient is currently taking opioid prescriptions. Information provided to patient regarding non-opioid alternatives. Patient advised to discuss non-opioid treatment plan with their provider. Functional ability and status Nutritional status Physical activity Advanced directives List of other physicians Hospitalizations, surgeries, and ER visits in previous 12 months Vitals Screenings to include  cognitive, depression, and falls Referrals and appointments  In addition, I have reviewed and discussed with patient certain preventive protocols, quality metrics, and best practice recommendations. A written personalized care plan for preventive services as well as general preventive health recommendations were provided to patient.     Kellie Simmering, LPN   03/08/91   Nurse Notes: 6 CIT not administered. Patient appeared cognitive per conversation.

## 2021-07-29 DIAGNOSIS — R350 Frequency of micturition: Secondary | ICD-10-CM | POA: Diagnosis not present

## 2021-07-29 DIAGNOSIS — R3915 Urgency of urination: Secondary | ICD-10-CM | POA: Diagnosis not present

## 2021-08-05 DIAGNOSIS — J479 Bronchiectasis, uncomplicated: Secondary | ICD-10-CM | POA: Diagnosis not present

## 2021-08-17 ENCOUNTER — Ambulatory Visit (INDEPENDENT_AMBULATORY_CARE_PROVIDER_SITE_OTHER): Payer: Medicare Other

## 2021-08-17 DIAGNOSIS — I4430 Unspecified atrioventricular block: Secondary | ICD-10-CM

## 2021-08-17 LAB — CUP PACEART REMOTE DEVICE CHECK
Date Time Interrogation Session: 20230213121101
Implantable Lead Implant Date: 20200129
Implantable Lead Implant Date: 20200129
Implantable Lead Location: 753859
Implantable Lead Location: 753860
Implantable Lead Model: 377
Implantable Lead Model: 377
Implantable Lead Serial Number: 80947537
Implantable Lead Serial Number: 80970966
Implantable Pulse Generator Implant Date: 20200129
Pulse Gen Model: 407145
Pulse Gen Serial Number: 69503797

## 2021-08-20 NOTE — Progress Notes (Signed)
Remote pacemaker transmission.   

## 2021-09-10 ENCOUNTER — Ambulatory Visit (INDEPENDENT_AMBULATORY_CARE_PROVIDER_SITE_OTHER): Payer: Medicare Other | Admitting: Adult Health

## 2021-09-10 ENCOUNTER — Encounter: Payer: Self-pay | Admitting: Adult Health

## 2021-09-10 VITALS — BP 120/70 | HR 61 | Temp 98.6°F | Ht 69.0 in | Wt 240.0 lb

## 2021-09-10 DIAGNOSIS — R2681 Unsteadiness on feet: Secondary | ICD-10-CM | POA: Diagnosis not present

## 2021-09-10 DIAGNOSIS — R42 Dizziness and giddiness: Secondary | ICD-10-CM | POA: Diagnosis not present

## 2021-09-10 DIAGNOSIS — F5101 Primary insomnia: Secondary | ICD-10-CM

## 2021-09-10 MED ORDER — ESZOPICLONE 3 MG PO TABS: 3.0000 mg | ORAL_TABLET | Freq: Every day | ORAL | 2 refills | Status: DC

## 2021-09-10 NOTE — Progress Notes (Signed)
Subjective:    Patient ID: Marcus [name], male    DOB: [birth year], 81 y.o.   MRN: [id number]  HPI 81 year old male who  has a past medical history of Acute meniscal tear of knee (left), Adjustment disorder with depressed mood (2019), Arthritis, AV block (08/01/2018), Benign essential hypertension (01/18/2007), Bradycardia (01/08/2011), Bronchiectasis with acute exacerbation (06/23/2020), Calcaneal spur, Chest pain, atypical (08/23/2014), Chronic cough (10/07/2014), Coronary artery disease, Cramps of lower extremity, Degeneration of lumbar intervertebral disc (10/14/2017), Diverticulosis of colon (07/07/2005), Dry eye syndrome of bilateral lacrimal glands, Echocardiogram findings abnormal, without diagnosis (04/23/2010), Edema of lower extremity, Elevated PSA (06/19/2014), Frequency of urination, GERD (gastroesophageal reflux disease) (01/18/2007), Gout, unspecified (01/18/2007), Hemorrhoids (01/19/2011), Hiatal hernia, History of colonic polyps (01/18/2007), History of malignant neoplasm of colon (1988), History of malignant neoplasm of prostate, Hyperlipidemia, Insomnia, unspecified (08/21/2007), Iron deficiency anemia, unspecified  (01/19/2011), Lumbar post-laminectomy syndrome (10/14/2017), Lumbar spondylolysis, Lumbar strain (12/14/2011), Malaria (1964), Normal nuclear stress test (04/23/2010), Obstructive sleep apnea (01/18/2007), Paraesophageal hernia, Personal history of colonic polyps (08/21/2009), Pneumonia, Postgastric surgery syndromes, Presence of permanent cardiac pacemaker, Primary osteoarthritis of knee (05/23/2015), Prostate cancer ([place]) (2016), S/P knee replacement (05/23/2015), Sensorineural hearing loss, bilateral, Skin lesion of face (07/23/2011), Ventral hernia, Vitamin B12 deficiency, and Vitamin D deficiency (10/28/2009).  He presents to the office today for multiple issues.  First issue is that of chronic insomnia.  This has been a longstanding issue he has trialed many  medications without success.  Most recent medication for insomnia was Lunesta 2 mg with.  This was last prescribed about a year ago.  Does believe that when he took it he slept better to some degree but still was not getting a full night sleep.  Currently getting 4 to 6 hours a night.  He is wearing his CPAP nightly.  Does not feel rested when he wakes up more throughout the day.  He does not have any depression or anxiety currently  His second issue is that of gait instability, again this is a longstanding issue.  He did have a lift made for his right foot as this was slightly shorter than his left but he is not wearing it because it is uncomfortable.  Continues to feel unsteady with walking and changing positions.  Will often have to hold onto a couch or wall when walking at home.  He does not use a cane or other device.  He did have a fall without injury while working outside in his garden last week  Finally, he reports that over the last 3 days he has been experiencing lightheadedness intermittently.  He does not feel as though the world is spinning around him.  Is more apparent when changing positions or bending over.  He does report working out in his garden over the last week trying to get his raise beds cleaned up and planted and forgot to take his medications over the week, he also was not staying hydrated while working outside in the warm weather.  He restarted all of his medications about 2 days ago.   Review of Systems See HPI   Past Medical History:  Diagnosis Date   Acute meniscal tear of knee left   Adjustment disorder with depressed mood 2019   related to prior passing of his wife   Arthritis    generalized   AV block 08/01/2018   Benign essential hypertension 01/18/2007   Bradycardia 01/08/2011   Bronchiectasis with acute exacerbation 06/23/2020   Calcaneal spur  Chest pain, atypical 08/23/2014   Chronic cough 10/07/2014   Coronary artery disease    Cramps of lower  extremity    in the mornings, occasionally   Degeneration of lumbar intervertebral disc 10/14/2017   Diverticulosis of colon 07/07/2005   Dry eye syndrome of bilateral lacrimal glands    Echocardiogram findings abnormal, without diagnosis 04/23/2010   normal LV function, mod. left atrial enlargement, mild right artrial enlargement and grade 1 diastolic dysfunction   Edema of lower extremity    ankles, elevates legs   Elevated PSA 06/19/2014   Frequency of urination    followed by dr dalhstedt   GERD (gastroesophageal reflux disease) 01/18/2007   Gout, unspecified 01/18/2007   Hemorrhoids 01/19/2011   Hiatal hernia    History of colonic polyps 01/18/2007   History of malignant neoplasm of colon 1988   surgically removed; no radiation or chemotherapy   History of malignant neoplasm of prostate    Hyperlipidemia    Insomnia, unspecified 08/21/2007   Iron deficiency anemia, unspecified  01/19/2011   Lumbar post-laminectomy syndrome 10/14/2017   Lumbar spondylolysis    Lumbar strain 12/14/2011   Malaria 1964   Normal nuclear stress test 04/23/2010   ef 51%,  septal hypokinesis, normal perfusion   Obstructive sleep apnea 01/18/2007   uses CPAP nightly   Paraesophageal hernia    large   Personal history of colonic polyps 08/21/2009   TUBULAR ADENOMAS   Pneumonia    history of; last episode 2015   Postgastric surgery syndromes    Presence of permanent cardiac pacemaker    Primary osteoarthritis of knee 05/23/2015   Prostate cancer ([place]) 2016   S/P knee replacement 05/23/2015   Sensorineural hearing loss, bilateral    Skin lesion of face 07/23/2011   Ventral hernia    Vitamin B12 deficiency    Vitamin D deficiency 10/28/2009    Social History   Socioeconomic History   Marital status: Widowed    Spouse name: Not on file   Number of children: 4   Years of education: 50   Highest education level: Some college, no degree  Occupational History   Occupation: retired     Fish farm manager: [employer]  Tobacco Use   Smoking status: Former    Packs/day: 2.00    Years: 20.00    Pack years: 40.00    Types: Cigarettes    Quit date: 07/05/1974    Years since quitting: 47.2   Smokeless tobacco: Never  Vaping Use   Vaping Use: Never used  Substance and Sexual Activity   Alcohol use: Not Currently    Alcohol/week: 30.0 standard drinks    Types: 30 Standard drinks or equivalent per week    Comment: 4-5 glasses of wine or 2-3 beers nightly   Drug use: No   Sexual activity: Not Currently  Other Topics Concern   Not on file  Social History Narrative   Recently widowed   Former Smoker    Alcohol use-yes 1-2 drinks per day      Occupation: Retired Midwife      Originally from [place], [place] - in [place] > 10 yrs as of 2017      Social Determinants of Health   Financial Resource Strain: Low Risk    Difficulty of Paying Living Expenses: Not hard at all  Food Insecurity: No Food Insecurity   Worried About Charity fundraiser in the Last Year: Never true   [employer] in the  Last Year: Never true  Transportation Needs: No Transportation Needs   Lack of Transportation (Medical): No   Lack of Transportation (Non-Medical): No  Physical Activity: Inactive   Days of Exercise per Week: 0 days   Minutes of Exercise per Session: 0 min  Stress: No Stress Concern Present   Feeling of Stress : Not at all  Social Connections: Not on file  Intimate Partner Violence: Not on file    Past Surgical History:  Procedure Laterality Date   BRONCHIAL WASHINGS  10/30/2019   Procedure: BRONCHIAL WASHINGS;  Surgeon: Julian Hy, DO;  Location: [facility];  Service: Endoscopy;;   CARDIAC CATHETERIZATION     CATARACT EXTRACTION W/ INTRAOCULAR LENS  IMPLANT, BILATERAL Bilateral    COLONOSCOPY     ESOPHAGOGASTRODUODENOSCOPY     KNEE ARTHROSCOPY Right 2007   KNEE ARTHROSCOPY  07/13/2011   Procedure: ARTHROSCOPY KNEE;  Surgeon: Cynda Familia;  Location: [facility];  Service: Orthopedics;  Laterality: Left;  partial menisectomy with chondrylplasty   KNEE SURGERY Left    LAMINECTOMY AND MICRODISCECTOMY LUMBAR SPINE  MARCH  2008   L3 -  4   LAPAROSCOPIC INCISIONAL / UMBILICAL / VENTRAL HERNIA REPAIR  2006   PACEMAKER IMPLANT N/A 08/02/2018   Procedure: PACEMAKER IMPLANT;  Surgeon: Evans Lance, MD;  Location: [facility];  Service: Cardiovascular;  Laterality: N/A;   PROSTATE BIOPSY     RADIOACTIVE SEED IMPLANT N/A 02/12/2019   Procedure: RADIOACTIVE SEED IMPLANT/BRACHYTHERAPY IMPLANT;  Surgeon: Franchot Gallo, MD;  Location: [facility];  Service: Urology;  Laterality: N/A;  90 MINS   SHOULDER ARTHROSCOPY Right 05-23-2007   SIGMOID COLECTOMY FOR CANCER  1989   SPACE OAR INSTILLATION N/A 02/12/2019   Procedure: SPACE OAR INSTILLATION;  Surgeon: Franchot Gallo, MD;  [facility]: [facility];  Service: Urology;  Laterality: N/A;   TOTAL KNEE ARTHROPLASTY Left 05/23/2015   Procedure: LEFT TOTAL KNEE ARTHROPLASTY;  Surgeon: Sydnee Cabal, MD;  [facility];  Service: Orthopedics;  Laterality: Left;   VERTICAL BANDED GASTROPLASTY  1986   VIDEO BRONCHOSCOPY N/A 10/30/2019   Procedure: VIDEO BRONCHOSCOPY WITH BRONICAL ALVEROLAR LAVAGE WITHOUT FLUORO;  Surgeon: Julian Hy, DO;  [facility];  Service: Endoscopy;  Laterality: N/A;    Family History  Problem Relation Age of Onset   Stroke Paternal Grandfather    Heart disease Paternal Grandfather    Esophagitis Father        died from perforated esophagus   Breast cancer Mother    Colon cancer Maternal Grandmother        questionable   Esophageal cancer Neg Hx    Prostate cancer Neg Hx    Stomach cancer Neg Hx     Allergies  Allergen Reactions   Contrast Media [Iodinated Contrast Media] Palpitations    TACHYCARDIA- patient states he has tolerated newer agents since this reaction >30 yrs ago    Current Outpatient Medications on File Prior to Visit  Medication Sig  Dispense Refill   allopurinol (ZYLOPRIM) 300 MG tablet TAKE 1 TABLET BY MOUTH EVERY DAY 90 tablet 1   amLODipine (NORVASC) 10 MG tablet TAKE 1 TABLET BY MOUTH EVERY DAY 90 tablet 0   aspirin EC 81 MG tablet Take 1 tablet (81 mg total) by mouth daily. Swallow whole. 90 tablet 3   Calcium Carb-Cholecalciferol (CALCIUM 600 + D PO) Take 1 tablet by mouth daily. 800 units of vitamin D     cephALEXin (KEFLEX) 500  MG capsule 2 caps po bid x 7 days 28 capsule 0   docusate sodium (COLACE) 100 MG capsule Take 100 mg by mouth daily.      HYDROcodone-acetaminophen (NORCO/VICODIN) 5-325 MG tablet hydrocodone 5 mg-acetaminophen 325 mg tablet  Take 1 tablet 4 times a day by oral route as needed.     meclizine (ANTIVERT) 25 MG tablet Take 1 tablet (25 mg total) by mouth 3 (three) times daily as needed for dizziness. 30 tablet 0   metoCLOPramide (REGLAN) 10 MG tablet TAKE 1 TABLET (10 MG TOTAL) BY MOUTH AT BEDTIME AS NEEDED FOR NAUSEA. 90 tablet 0   Multiple Vitamin (MULTIVITAMIN) tablet Take 1 tablet by mouth daily.     omeprazole (PRILOSEC) 40 MG capsule TAKE 1 CAPSULE BY MOUTH EVERY DAY 90 capsule 1   oxyCODONE-acetaminophen (PERCOCET/ROXICET) 5-325 MG tablet Take 1-2 tablets by mouth every 6 (six) hours as needed for severe pain. 6 tablet 0   potassium gluconate 595 (99 K) MG TABS tablet Take 595 mg by mouth daily.     Respiratory Therapy Supplies (FLUTTER) DEVI 1 each by Does not apply route in the morning and at bedtime. 1 each 0   tamsulosin (FLOMAX) 0.4 MG CAPS capsule Take 0.4 mg by mouth daily.     tiZANidine (ZANAFLEX) 2 MG tablet Take 1 tablet (2 mg total) by mouth every 8 (eight) hours as needed for muscle spasms. 21 tablet 0   traMADol (ULTRAM) 50 MG tablet tramadol 50 mg tablet  Take 1 tablet every 6 hours by oral route as needed.     valsartan-hydrochlorothiazide (DIOVAN-HCT) 320-25 MG tablet TAKE 1 TABLET BY MOUTH DAILY. **DUE FOR YEARLY PHYSICAL** 90 tablet 1   vitamin B-12 (CYANOCOBALAMIN)  1000 MCG tablet Take 1,000 mcg by mouth daily.      rosuvastatin (CRESTOR) 20 MG tablet Take 1 tablet (20 mg total) by mouth daily. 90 tablet 3   No current facility-administered medications on file prior to visit.    BP 120/70    Pulse 61    Temp 98.6 F (37 C) (Oral)    Ht '5\' 9"'$  (1.753 m)    Wt 240 lb (108.9 kg)    SpO2 96%    BMI 35.44 kg/m       Objective:   Physical Exam Vitals and nursing note reviewed.  Constitutional:      Appearance: Normal appearance.  Eyes:     Extraocular Movements: Extraocular movements intact.     Right eye: No nystagmus.     Left eye: No nystagmus.     Conjunctiva/sclera: Conjunctivae normal.     Pupils: Pupils are equal, round, and reactive to light.  Cardiovascular:     Rate and Rhythm: Normal rate and regular rhythm.     Pulses: Normal pulses.     Heart sounds: Normal heart sounds.  Pulmonary:     Effort: Pulmonary effort is normal.     Breath sounds: Normal breath sounds.  Neurological:     General: No focal deficit present.     Mental Status: He is alert and oriented to person, place, and time.     Gait: Gait abnormal (limping gait).  Psychiatric:        Mood and Affect: Mood normal.        Behavior: Behavior normal.        Thought Content: Thought content normal.        Judgment: Judgment normal.      Assessment & Plan:  1.  Primary insomnia -We will place back on Lunesta today.  Advise follow-up if medication is not working well for him - eszopiclone 3 MG TABS; Take 1 tablet (3 mg total) by mouth at bedtime. Take immediately before bedtime  Dispense: 30 tablet; Refill: 2  2. Gait instability -Encouraged physical therapy for gait training.  Does not want to go through physical therapy right now, he will let me know when he is ready.  Okay to use assistive device with ambulation  3. Episodic lightheadedness -Possibly due to dehydration or restarting his medications.  We will have him monitor at home and follow-up next week if no  improvement.  Encouraged adequate hydration especially when working outside  [employer], NP

## 2021-09-14 DIAGNOSIS — H353131 Nonexudative age-related macular degeneration, bilateral, early dry stage: Secondary | ICD-10-CM | POA: Diagnosis not present

## 2021-09-14 DIAGNOSIS — Z961 Presence of intraocular lens: Secondary | ICD-10-CM | POA: Diagnosis not present

## 2021-09-29 ENCOUNTER — Ambulatory Visit (INDEPENDENT_AMBULATORY_CARE_PROVIDER_SITE_OTHER): Payer: Medicare Other | Admitting: Adult Health

## 2021-09-29 ENCOUNTER — Encounter: Payer: Self-pay | Admitting: Adult Health

## 2021-09-29 VITALS — BP 120/60 | HR 70 | Temp 98.6°F | Ht 69.0 in | Wt 237.0 lb

## 2021-09-29 DIAGNOSIS — F5101 Primary insomnia: Secondary | ICD-10-CM | POA: Diagnosis not present

## 2021-09-29 NOTE — Progress Notes (Signed)
? ?Subjective:  ? ? Patient ID: Marcus [name], male    DOB: 06/07/1941, 81 y.o.   MRN: [id number] ? ?HPI ? ?81 year old male who  has a past medical history of Acute meniscal tear of knee (left), Adjustment disorder with depressed mood (2019), Arthritis, AV block (08/01/2018), Benign essential hypertension (01/18/2007), Bradycardia (01/08/2011), Bronchiectasis with acute exacerbation (06/23/2020), Calcaneal spur, Chest pain, atypical (08/23/2014), Chronic cough (10/07/2014), Coronary artery disease, Cramps of lower extremity, Degeneration of lumbar intervertebral disc (10/14/2017), Diverticulosis of colon (07/07/2005), Dry eye syndrome of bilateral lacrimal glands, Echocardiogram findings abnormal, without diagnosis (04/23/2010), Edema of lower extremity, Elevated PSA (06/19/2014), Frequency of urination, GERD (gastroesophageal reflux disease) (01/18/2007), Gout, unspecified (01/18/2007), Hemorrhoids (01/19/2011), Hiatal hernia, History of colonic polyps (01/18/2007), History of malignant neoplasm of colon (1988), History of malignant neoplasm of prostate, Hyperlipidemia, Insomnia, unspecified (08/21/2007), Iron deficiency anemia, unspecified  (01/19/2011), Lumbar post-laminectomy syndrome (10/14/2017), Lumbar spondylolysis, Lumbar strain (12/14/2011), Malaria (1964), Normal nuclear stress test (04/23/2010), Obstructive sleep apnea (01/18/2007), Paraesophageal hernia, Personal history of colonic polyps (08/21/2009), Pneumonia, Postgastric surgery syndromes, Presence of permanent cardiac pacemaker, Primary osteoarthritis of knee (05/23/2015), Prostate cancer ([place]) (2016), S/P knee replacement (05/23/2015), Sensorineural hearing loss, bilateral, Skin lesion of face (07/23/2011), Ventral hernia, Vitamin B12 deficiency, and Vitamin D deficiency (10/28/2009). ? ?He presents to the office today for for follow-up regarding chronic insomnia.  When he was last seen roughly 3 weeks ago he was getting 4 to 6 hours of sleep at  night.  He was wearing his CPAP nightly.  He was not feeling rested and felt as though he could take a nap during the day.  He has been trialed on many occasions in the past for chronic insomnia, about a year prior he was on Lunesta 2 mg and had some decent success with this medication but never had it refilled.  We increased his Lunesta dose to 3 mg, today he reports that he is sleeping much better is getting 7 to 8 hours of restful sleep and feels as though he has more energy throughout the day.  He does not have any side effects of the medication and does not wake up feeling fatigued. ?Review of Systems ?See HPI  ? ?Past Medical History:  ?Diagnosis Date  ? Acute meniscal tear of knee left  ? Adjustment disorder with depressed mood 2019  ? related to prior passing of his wife  ? Arthritis   ? generalized  ? AV block 08/01/2018  ? Benign essential hypertension 01/18/2007  ? Bradycardia 01/08/2011  ? Bronchiectasis with acute exacerbation 06/23/2020  ? Calcaneal spur   ? Chest pain, atypical 08/23/2014  ? Chronic cough 10/07/2014  ? Coronary artery disease   ? Cramps of lower extremity   ? in the mornings, occasionally  ? Degeneration of lumbar intervertebral disc 10/14/2017  ? Diverticulosis of colon 07/07/2005  ? Dry eye syndrome of bilateral lacrimal glands   ? Echocardiogram findings abnormal, without diagnosis 04/23/2010  ? normal LV function, mod. left atrial enlargement, mild right artrial enlargement and grade 1 diastolic dysfunction  ? Edema of lower extremity   ? ankles, elevates legs  ? Elevated PSA 06/19/2014  ? Frequency of urination   ? followed by dr dalhstedt  ? GERD (gastroesophageal reflux disease) 01/18/2007  ? Gout, unspecified 01/18/2007  ? Hemorrhoids 01/19/2011  ? Hiatal hernia   ? History of colonic polyps 01/18/2007  ? History of malignant neoplasm of colon 1988  ? surgically removed; no radiation or  chemotherapy  ? History of malignant neoplasm of prostate   ? Hyperlipidemia   ? Insomnia,  unspecified 08/21/2007  ? Iron deficiency anemia, unspecified  01/19/2011  ? Lumbar post-laminectomy syndrome 10/14/2017  ? Lumbar spondylolysis   ? Lumbar strain 12/14/2011  ? Malaria 1964  ? Normal nuclear stress test 04/23/2010  ? ef 51%,  septal hypokinesis, normal perfusion  ? Obstructive sleep apnea 01/18/2007  ? uses CPAP nightly  ? Paraesophageal hernia   ? large  ? Personal history of colonic polyps 08/21/2009  ? TUBULAR ADENOMAS  ? Pneumonia   ? history of; last episode 2015  ? Postgastric surgery syndromes   ? Presence of permanent cardiac pacemaker   ? Primary osteoarthritis of knee 05/23/2015  ? Prostate cancer ([place]) 2016  ? S/P knee replacement 05/23/2015  ? Sensorineural hearing loss, bilateral   ? Skin lesion of face 07/23/2011  ? Ventral hernia   ? Vitamin B12 deficiency   ? Vitamin D deficiency 10/28/2009  ? ? ?Social History  ? ?Socioeconomic History  ? Marital status: Widowed  ?  Spouse name: Not on file  ? Number of children: 4  ? Years of education: 74  ? Highest education level: Some college, no degree  ?Occupational History  ? Occupation: retired  ?  Employer: [employer]  ?Tobacco Use  ? Smoking status: Former  ?  Packs/day: 2.00  ?  Years: 20.00  ?  Pack years: 40.00  ?  Types: Cigarettes  ?  Quit date: 07/05/1974  ?  Years since quitting: 75.2  ? Smokeless tobacco: Never  ?Vaping Use  ? Vaping Use: Never used  ?Substance and Sexual Activity  ? Alcohol use: Not Currently  ?  Alcohol/week: 30.0 standard drinks  ?  Types: 30 Standard drinks or equivalent per week  ?  Comment: 4-5 glasses of wine or 2-3 beers nightly  ? Drug use: No  ? Sexual activity: Not Currently  ?Other Topics Concern  ? Not on file  ?Social History Narrative  ? Recently widowed  ? Former Smoker   ? Alcohol use-yes 1-2 drinks per day     ? Occupation: Retired Midwife     ? Originally from [place], [place] - in [place] > 10 yrs as of 2017  ?   ? ?Social Determinants of Health  ? ?Financial Resource Strain: Low Risk   ?  Difficulty of Paying Living Expenses: Not hard at all  ?Food Insecurity: No Food Insecurity  ? Worried About Charity fundraiser in the Last Year: Never true  ? Ran Out of Food in the Last Year: Never true  ?Transportation Needs: No Transportation Needs  ? Lack of Transportation (Medical): No  ? Lack of Transportation (Non-Medical): No  ?Physical Activity: Inactive  ? Days of Exercise per Week: 0 days  ? Minutes of Exercise per Session: 0 min  ?Stress: No Stress Concern Present  ? Feeling of Stress : Not at all  ?Social Connections: Not on file  ?Intimate Partner Violence: Not on file  ? ? ?Past Surgical History:  ?Procedure Laterality Date  ? BRONCHIAL WASHINGS  10/30/2019  ? Procedure: BRONCHIAL WASHINGS;  Surgeon: Julian Hy, DO;  [facility];  Service: Endoscopy;;  ? CARDIAC CATHETERIZATION    ? CATARACT EXTRACTION W/ INTRAOCULAR LENS  IMPLANT, BILATERAL Bilateral   ? COLONOSCOPY    ? ESOPHAGOGASTRODUODENOSCOPY    ? KNEE ARTHROSCOPY Right 2007  ? KNEE ARTHROSCOPY  07/13/2011  ? Procedure:  ARTHROSCOPY KNEE;  Surgeon: Cynda Familia;  Location: [facility];  Service: Orthopedics;  Laterality: Left;  partial menisectomy with chondrylplasty  ? KNEE SURGERY Left   ? LAMINECTOMY AND MICRODISCECTOMY LUMBAR SPINE  MARCH  2008  ? L3 -  4  ? LAPAROSCOPIC INCISIONAL / UMBILICAL / VENTRAL HERNIA REPAIR  2006  ? PACEMAKER IMPLANT N/A 08/02/2018  ? Procedure: PACEMAKER IMPLANT;  Surgeon: Evans Lance, MD;  Location: [facility];  Service: Cardiovascular;  Laterality: N/A;  ? PROSTATE BIOPSY    ? RADIOACTIVE SEED IMPLANT N/A 02/12/2019  ? Procedure: RADIOACTIVE SEED IMPLANT/BRACHYTHERAPY IMPLANT;  Surgeon: Franchot Gallo, MD;  [facility];  Service: Urology;  Laterality: N/A;  90 MINS  ? SHOULDER ARTHROSCOPY Right 05-23-2007  ? SIGMOID COLECTOMY FOR CANCER  1989  ? SPACE OAR INSTILLATION N/A 02/12/2019  ? Procedure: SPACE OAR INSTILLATION;  Surgeon: Franchot Gallo, MD;   [facility];  Service: Urology;  Laterality: N/A;  ? TOTAL KNEE ARTHROPLASTY Left 05/23/2015  ? Procedure: LEFT TOTAL KNEE ARTHROPLASTY;  Surgeon: Sydnee Cabal, MD;  [facility];  Service: [facility]

## 2021-10-03 ENCOUNTER — Other Ambulatory Visit: Payer: Self-pay | Admitting: Adult Health

## 2021-10-03 DIAGNOSIS — I10 Essential (primary) hypertension: Secondary | ICD-10-CM

## 2021-10-05 NOTE — Telephone Encounter (Signed)
Pt needs labwork drawn. ?Notified of above & appt scheduled. ?

## 2021-10-06 ENCOUNTER — Telehealth: Payer: Self-pay | Admitting: Adult Health

## 2021-10-06 NOTE — Telephone Encounter (Signed)
Patient calling in with respiratory symptoms: ?Shortness of breath, chest pain, palpitations or other red words send to Triage ? ?Does the patient have a fever over 100, cough, congestion, sore throat, runny nose, lost of taste/smell (please list symptoms that patient has)?cough ? ?What date did symptoms start?10-03-2021 ?(If over 5 days ago, pt may be scheduled for in person visit) ? ?Have you tested for Covid in the last 5 days? No  ? ?If yes, was it positive '[]'$  OR negative '[]'$ ? If positive in the last 5 days, please schedule virtual visit now. If negative, schedule for an in person OV with the next available provider if PCP has no openings. Please also let patient know they will be tested again (follow the script below) ? ?"you will have to arrive 94mns prior to your appt time to be Covid tested. Please park in back of office at the [facility] & call [phone] let the staff know you have arrived. A staff member will meet you at your car to do a rapid covid test. Once the test has resulted you will be notified by phone of your results to determine if appt will remain an in person visit or be converted to a virtual/phone visit. If you arrive less than 371ms before your appt time, your visit will be automatically converted to virtual & any recommended testing will happen AFTER the visit." ?Pt has an appt on 10-07-2021 330pm ? ?[facility] ?If no availability for virtual visit in office,  please schedule another [facility] office ? ?If no availability at another [facility], please instruct patient that they can schedule an evisit or virtual visit through their mychart account. Visits up to 8pm ? ?patients can be seen in office 5 days after positive COVID test ? ?  ?

## 2021-10-07 ENCOUNTER — Ambulatory Visit: Payer: Medicare Other | Admitting: Adult Health

## 2021-11-04 ENCOUNTER — Ambulatory Visit (INDEPENDENT_AMBULATORY_CARE_PROVIDER_SITE_OTHER): Payer: Medicare Other | Admitting: Adult Health

## 2021-11-04 ENCOUNTER — Encounter: Payer: Self-pay | Admitting: Adult Health

## 2021-11-04 VITALS — BP 122/80 | HR 61 | Temp 98.4°F | Ht 68.0 in | Wt 240.0 lb

## 2021-11-04 DIAGNOSIS — E559 Vitamin D deficiency, unspecified: Secondary | ICD-10-CM | POA: Diagnosis not present

## 2021-11-04 DIAGNOSIS — E782 Mixed hyperlipidemia: Secondary | ICD-10-CM | POA: Diagnosis not present

## 2021-11-04 DIAGNOSIS — M1A9XX0 Chronic gout, unspecified, without tophus (tophi): Secondary | ICD-10-CM | POA: Diagnosis not present

## 2021-11-04 DIAGNOSIS — K219 Gastro-esophageal reflux disease without esophagitis: Secondary | ICD-10-CM

## 2021-11-04 DIAGNOSIS — Z8546 Personal history of malignant neoplasm of prostate: Secondary | ICD-10-CM | POA: Diagnosis not present

## 2021-11-04 DIAGNOSIS — I10 Essential (primary) hypertension: Secondary | ICD-10-CM

## 2021-11-04 DIAGNOSIS — F5101 Primary insomnia: Secondary | ICD-10-CM | POA: Diagnosis not present

## 2021-11-04 DIAGNOSIS — Z0000 Encounter for general adult medical examination without abnormal findings: Secondary | ICD-10-CM

## 2021-11-04 LAB — COMPREHENSIVE METABOLIC PANEL
ALT: 14 U/L (ref 0–53)
AST: 17 U/L (ref 0–37)
Albumin: 3.9 g/dL (ref 3.5–5.2)
Alkaline Phosphatase: 53 U/L (ref 39–117)
BUN: 23 mg/dL (ref 6–23)
CO2: 30 mEq/L (ref 19–32)
Calcium: 9.2 mg/dL (ref 8.4–10.5)
Chloride: 104 mEq/L (ref 96–112)
Creatinine, Ser: 1.12 mg/dL (ref 0.40–1.50)
GFR: 61.45 mL/min (ref >60.00)
Glucose, Bld: 102 mg/dL — ABNORMAL HIGH (ref 70–99)
Potassium: 4.2 mEq/L (ref 3.5–5.1)
Sodium: 141 mEq/L (ref 135–145)
Total Bilirubin: 0.5 mg/dL (ref 0.2–1.2)
Total Protein: 6.6 g/dL (ref 6.0–8.3)

## 2021-11-04 LAB — URIC ACID: Uric Acid, Serum: 4.5 mg/dL (ref 4.0–7.8)

## 2021-11-04 LAB — VITAMIN D 25 HYDROXY (VIT D DEFICIENCY, FRACTURES): VITD: 21.55 ng/mL — ABNORMAL LOW (ref 30.00–100.00)

## 2021-11-04 LAB — LIPID PANEL
Cholesterol: 175 mg/dL (ref 0–200)
HDL: 56.9 mg/dL (ref >39.00)
LDL Cholesterol: 102 mg/dL — ABNORMAL HIGH (ref 0–99)
NonHDL: 117.97
Total CHOL/HDL Ratio: 3
Triglycerides: 80 mg/dL (ref 0.0–149.0)
VLDL: 16 mg/dL (ref 0.0–40.0)

## 2021-11-04 LAB — CBC WITH DIFFERENTIAL/PLATELET
Basophils Absolute: 0 10*3/uL (ref 0.0–0.1)
Basophils Relative: 0.7 % (ref 0.0–3.0)
Eosinophils Absolute: 0.1 10*3/uL (ref 0.0–0.7)
Eosinophils Relative: 2.3 % (ref 0.0–5.0)
HCT: 40.2 % (ref 39.0–52.0)
Hemoglobin: 13.4 g/dL (ref 13.0–17.0)
Lymphocytes Relative: 23.6 % (ref 12.0–46.0)
Lymphs Abs: 1.3 10*3/uL (ref 0.7–4.0)
MCHC: 33.4 g/dL (ref 30.0–36.0)
MCV: 90.7 fl (ref 78.0–100.0)
Monocytes Absolute: 0.4 10*3/uL (ref 0.1–1.0)
Monocytes Relative: 8 % (ref 3.0–12.0)
Neutro Abs: 3.6 10*3/uL (ref 1.4–7.7)
Neutrophils Relative %: 65.4 % (ref 43.0–77.0)
Platelets: 197 10*3/uL (ref 150.0–400.0)
RBC: 4.43 Mil/uL (ref 4.22–5.81)
RDW: 15.6 % — ABNORMAL HIGH (ref 11.5–15.5)
WBC: 5.5 10*3/uL (ref 4.0–10.5)

## 2021-11-04 LAB — PSA: PSA: 0.04 ng/mL — ABNORMAL LOW (ref 0.10–4.00)

## 2021-11-04 LAB — TSH: TSH: 1.03 u[IU]/mL (ref 0.35–5.50)

## 2021-11-04 NOTE — Progress Notes (Signed)
? ?Subjective:  ? ? Patient ID: Marcus [name], male    DOB: [birth year], 81 y.o.   MRN: [id number] ? ?HPI ?Patient presents for yearly preventative medicine examination. He is a pleasant 81 year old male who  has a past medical history of Acute meniscal tear of knee (left), Arthritis, AV block (08/01/2018), Benign essential hypertension (01/18/2007), Bronchiectasis with acute exacerbation (06/23/2020), Chest pain, atypical (08/23/2014), Chronic cough (10/07/2014), Coronary artery disease, Degeneration of lumbar intervertebral disc (10/14/2017), Diverticulosis of colon (07/07/2005), Elevated PSA (06/19/2014), GERD (gastroesophageal reflux disease) (01/18/2007), Gout, unspecified (01/18/2007), Hemorrhoids (01/19/2011), Hiatal hernia, History of colonic polyps (01/18/2007), Insomnia, unspecified (08/21/2007), Iron deficiency anemia, unspecified  (01/19/2011), Lumbar post-laminectomy syndrome (10/14/2017), Lumbar spondylolysis, Lumbar strain (12/14/2011), Obstructive sleep apnea (01/18/2007), Paraesophageal hernia, Primary osteoarthritis of knee (05/23/2015), Prostate cancer ([place]) (2016), S/P knee replacement (05/23/2015), Sensorineural hearing loss, bilateral, Vitamin B12 deficiency, and Vitamin D deficiency (10/28/2009). ? ?HTN - managed with diovan HCT 320-25 mg daily and Norvasc 10 mg daily.  He denies dizziness, lightheadedness, chest pain, shortness of breath ?BP Readings from Last 3 Encounters:  ?11/04/21 122/80  ?09/29/21 120/60  ?09/10/21 120/70  ? ?Hyperlipidemia/CAD -managed by cardiology.  Previous CT demonstrating coronary artery calcification.  He has had previous cardiac catheterization in [place] and was normal by patient report.  Denies chest pain, DOE, shortness of breath. Takes Crestor 20 mg daily and ASA 81 mg daily.  ?Lab Results  ?Component Value Date  ? CHOL 121 03/04/2017  ? HDL 44.30 03/04/2017  ? [place] 65 03/04/2017  ? TRIG 62.0 03/04/2017  ? CHOLHDL 3 03/04/2017  ? ?Gout - takes Allopurinol  300 mg daily . Denies gout flares ? ?Insomnia -well-controlled on Lunesta 3 mg nightly.  Reports that since starting this medication is the best sleep he is ever had.  ? ?History of Prostate Cancer - had brachytherapy 2.5 years ago for intermittent risk prostate cancer. Is followed by Urology yearly. Currently prescribed flomax 0.4 mg daily.  ? ?All immunizations and health maintenance protocols were reviewed with the patient and needed orders were placed. ? ?Appropriate screening laboratory values were ordered for the patient including screening of hyperlipidemia, renal function and hepatic function. ?If indicated by BPH, a PSA was ordered. ? ?Medication reconciliation,  past medical history, social history, problem list and allergies were reviewed in detail with the patient ? ?Goals were established with regard to weight loss, exercise, and  diet in compliance with medications ?Wt Readings from Last 3 Encounters:  ?11/04/21 240 lb (108.9 kg)  ?09/29/21 237 lb (107.5 kg)  ?09/10/21 240 lb (108.9 kg)  ? ?Review of Systems  ?Constitutional: Negative.   ?HENT: Negative.    ?Eyes: Negative.   ?Respiratory: Negative.    ?Cardiovascular: Negative.   ?Gastrointestinal: Negative.   ?Endocrine: Negative.   ?Genitourinary: Negative.   ?Musculoskeletal: Negative.   ?Skin: Negative.   ?Allergic/Immunologic: Negative.   ?Neurological: Negative.   ?Hematological: Negative.   ?Psychiatric/Behavioral: Negative.    ?All other systems reviewed and are negative. ? ?Past Medical History:  ?Diagnosis Date  ? Acute meniscal tear of knee left  ? Arthritis   ? generalized  ? AV block 08/01/2018  ? Benign essential hypertension 01/18/2007  ? Bronchiectasis with acute exacerbation 06/23/2020  ? Chest pain, atypical 08/23/2014  ? Chronic cough 10/07/2014  ? Coronary artery disease   ? Degeneration of lumbar intervertebral disc 10/14/2017  ? Diverticulosis of colon 07/07/2005  ? Elevated PSA 06/19/2014  ? GERD (gastroesophageal reflux  disease)  01/18/2007  ? Gout, unspecified 01/18/2007  ? Hemorrhoids 01/19/2011  ? Hiatal hernia   ? History of colonic polyps 01/18/2007  ? Insomnia, unspecified 08/21/2007  ? Iron deficiency anemia, unspecified  01/19/2011  ? Lumbar post-laminectomy syndrome 10/14/2017  ? Lumbar spondylolysis   ? Lumbar strain 12/14/2011  ? Obstructive sleep apnea 01/18/2007  ? uses CPAP nightly  ? Paraesophageal hernia   ? large  ? Primary osteoarthritis of knee 05/23/2015  ? Prostate cancer ([place]) 2016  ? S/P knee replacement 05/23/2015  ? Sensorineural hearing loss, bilateral   ? Vitamin B12 deficiency   ? Vitamin D deficiency 10/28/2009  ? ? ?Social History  ? ?Socioeconomic History  ? Marital status: Widowed  ?  Spouse name: Not on file  ? Number of children: 4  ? Years of education: 29  ? Highest education level: Some college, no degree  ?Occupational History  ? Occupation: retired  ?  Employer: [employer]  ?Tobacco Use  ? Smoking status: Former  ?  Packs/day: 2.00  ?  Years: 20.00  ?  Pack years: 40.00  ?  Types: Cigarettes  ?  Quit date: 07/05/1974  ?  Years since quitting: 47.3  ? Smokeless tobacco: Never  ?Vaping Use  ? Vaping Use: Never used  ?Substance and Sexual Activity  ? Alcohol use: Not Currently  ?  Alcohol/week: 30.0 standard drinks  ?  Types: 30 Standard drinks or equivalent per week  ?  Comment: 4-5 glasses of wine or 2-3 beers nightly  ? Drug use: No  ? Sexual activity: Not Currently  ?Other Topics Concern  ? Not on file  ?Social History Narrative  ? Recently widowed  ? Former Smoker   ? Alcohol use-yes 1-2 drinks per day     ? Occupation: Retired Midwife     ? Originally from [place], [place] - in [place] > 10 yrs as of 2017  ?   ? ?Social Determinants of Health  ? ?Financial Resource Strain: Low Risk   ? Difficulty of Paying Living Expenses: Not hard at all  ?Food Insecurity: No Food Insecurity  ? Worried About Charity fundraiser in the Last Year: Never true  ? Ran Out of Food in the Last Year: Never true   ?Transportation Needs: No Transportation Needs  ? Lack of Transportation (Medical): No  ? Lack of Transportation (Non-Medical): No  ?Physical Activity: Inactive  ? Days of Exercise per Week: 0 days  ? Minutes of Exercise per Session: 0 min  ?Stress: No Stress Concern Present  ? Feeling of Stress : Not at all  ?Social Connections: Not on file  ?Intimate Partner Violence: Not on file  ? ? ?Past Surgical History:  ?Procedure Laterality Date  ? BRONCHIAL WASHINGS  10/30/2019  ? Procedure: BRONCHIAL WASHINGS;  Surgeon: Julian Hy, DO;  [facility]: [facility];  Service: Endoscopy;;  ? CARDIAC CATHETERIZATION    ? CATARACT EXTRACTION W/ INTRAOCULAR LENS  IMPLANT, BILATERAL Bilateral   ? COLONOSCOPY    ? ESOPHAGOGASTRODUODENOSCOPY    ? KNEE ARTHROSCOPY Right 2007  ? KNEE ARTHROSCOPY  07/13/2011  ? Procedure: ARTHROSCOPY KNEE;  Surgeon: Cynda Familia;  Location: [facility];  Service: Orthopedics;  Laterality: Left;  partial menisectomy with chondrylplasty  ? KNEE SURGERY Left   ? LAMINECTOMY AND MICRODISCECTOMY LUMBAR SPINE  MARCH  2008  ? L3 -  4  ? LAPAROSCOPIC INCISIONAL / UMBILICAL / VENTRAL HERNIA REPAIR  2006  ? PACEMAKER  IMPLANT N/A 08/02/2018  ? Procedure: PACEMAKER IMPLANT;  Surgeon: Evans Lance, MD;  Location: [facility];  Service: Cardiovascular;  Laterality: N/A;  ? PROSTATE BIOPSY    ? RADIOACTIVE SEED IMPLANT N/A 02/12/2019  ? Procedure: RADIOACTIVE SEED IMPLANT/BRACHYTHERAPY IMPLANT;  Surgeon: Franchot Gallo, MD;  [facility];  Service: Urology;  Laterality: N/A;  90 MINS  ? SHOULDER ARTHROSCOPY Right 05-23-2007  ? SIGMOID COLECTOMY FOR CANCER  1989  ? SPACE OAR INSTILLATION N/A 02/12/2019  ? Procedure: SPACE OAR INSTILLATION;  Surgeon: Franchot Gallo, MD;  [facility];  Service: Urology;  Laterality: N/A;  ? TOTAL KNEE ARTHROPLASTY Left 05/23/2015  ? Procedure: LEFT TOTAL KNEE ARTHROPLASTY;  Surgeon: Sydnee Cabal, MD;  [facility];  Service:  Orthopedics;  Laterality: Left;  ? VERTICAL BANDED GASTROPLASTY  1986  ? VIDEO BRONCHOSCOPY N/A 10/30/2019  ? Procedure: VIDEO BRONCHOSCOPY WITH BRONICAL ALVEROLAR LAVAGE WITHOUT FLUORO;  Surgeon: Julian Hy, DO;  [place]

## 2021-11-05 ENCOUNTER — Telehealth: Payer: Self-pay | Admitting: Adult Health

## 2021-11-05 NOTE — Telephone Encounter (Signed)
Updated patient on labs. Vitamin D is low. Will have him start 1000 units daily  ? ? ?

## 2021-11-16 ENCOUNTER — Ambulatory Visit (INDEPENDENT_AMBULATORY_CARE_PROVIDER_SITE_OTHER): Payer: Medicare Other

## 2021-11-16 DIAGNOSIS — I4430 Unspecified atrioventricular block: Secondary | ICD-10-CM

## 2021-11-17 LAB — CUP PACEART REMOTE DEVICE CHECK
Date Time Interrogation Session: 20230516093256
Implantable Lead Implant Date: 20200129
Implantable Lead Implant Date: 20200129
Implantable Lead Location: 753859
Implantable Lead Location: 753860
Implantable Lead Model: 377
Implantable Lead Model: 377
Implantable Lead Serial Number: 80947537
Implantable Lead Serial Number: 80970966
Implantable Pulse Generator Implant Date: 20200129
Pulse Gen Model: 407145
Pulse Gen Serial Number: 69503797

## 2021-11-20 DIAGNOSIS — G4733 Obstructive sleep apnea (adult) (pediatric): Secondary | ICD-10-CM | POA: Diagnosis not present

## 2021-12-04 NOTE — Progress Notes (Signed)
Remote pacemaker transmission.   

## 2021-12-23 ENCOUNTER — Other Ambulatory Visit: Payer: Self-pay | Admitting: Adult Health

## 2022-01-01 ENCOUNTER — Ambulatory Visit: Payer: Medicare Other | Admitting: Adult Health

## 2022-02-02 DIAGNOSIS — J479 Bronchiectasis, uncomplicated: Secondary | ICD-10-CM | POA: Diagnosis not present

## 2022-02-15 ENCOUNTER — Ambulatory Visit: Payer: Medicare Other

## 2022-02-16 ENCOUNTER — Ambulatory Visit (INDEPENDENT_AMBULATORY_CARE_PROVIDER_SITE_OTHER): Payer: Medicare Other | Admitting: Adult Health

## 2022-02-16 ENCOUNTER — Encounter: Payer: Self-pay | Admitting: Adult Health

## 2022-02-16 VITALS — BP 150/80 | HR 59 | Temp 98.0°F | Ht 68.0 in | Wt 241.0 lb

## 2022-02-16 DIAGNOSIS — F5101 Primary insomnia: Secondary | ICD-10-CM

## 2022-02-16 DIAGNOSIS — R053 Chronic cough: Secondary | ICD-10-CM

## 2022-02-16 LAB — CUP PACEART REMOTE DEVICE CHECK
Date Time Interrogation Session: 20230815065621
Implantable Lead Implant Date: 20200129
Implantable Lead Implant Date: 20200129
Implantable Lead Location: 753859
Implantable Lead Location: 753860
Implantable Lead Model: 377
Implantable Lead Model: 377
Implantable Lead Serial Number: 80947537
Implantable Lead Serial Number: 80970966
Implantable Pulse Generator Implant Date: 20200129
Pulse Gen Model: 407145
Pulse Gen Serial Number: 69503797

## 2022-02-16 MED ORDER — ESZOPICLONE 3 MG PO TABS: 3.0000 mg | ORAL_TABLET | Freq: Every day | ORAL | 2 refills | Status: DC

## 2022-02-16 NOTE — Progress Notes (Signed)
Subjective:    Patient ID: Marcus [name], male    DOB: 06/03/1941, 81 y.o.   MRN: [id number]  HPI 81 year old male who  has a past medical history of Acute meniscal tear of knee (left), Arthritis, AV block (08/01/2018), Benign essential hypertension (01/18/2007), Bronchiectasis with acute exacerbation (06/23/2020), Chest pain, atypical (08/23/2014), Chronic cough (10/07/2014), Coronary artery disease, Degeneration of lumbar intervertebral disc (10/14/2017), Diverticulosis of colon (07/07/2005), Elevated PSA (06/19/2014), GERD (gastroesophageal reflux disease) (01/18/2007), Gout, unspecified (01/18/2007), Hemorrhoids (01/19/2011), Hiatal hernia, History of colonic polyps (01/18/2007), Insomnia, unspecified (08/21/2007), Iron deficiency anemia, unspecified  (01/19/2011), Lumbar post-laminectomy syndrome (10/14/2017), Lumbar spondylolysis, Lumbar strain (12/14/2011), Obstructive sleep apnea (01/18/2007), Paraesophageal hernia, Primary osteoarthritis of knee (05/23/2015), Prostate cancer ([place]) (2016), S/P knee replacement (05/23/2015), Sensorineural hearing loss, bilateral, Vitamin B12 deficiency, and Vitamin D deficiency (10/28/2009).  He presents to the office for acute issue of a productive cough.  This has been an ongoing issue for multiple years.  Has been seen by pulmonary and had PFTs done which were in normal limits.  Does have a history of OSA and is on CPAP.  He has also been seen by ENT and GI as a thought that his chronic cough could have been as a result of GERD.  He is taking a PPI.  CT scan has shown mild bronchiectasis  He does feel as though he might have some postnasal drip.  Overall he feels well except for a chronic cough that seems to be more apparent in the middle the night.  He does cough up a greenish-yellow sputum.  Denies shortness of breath, wheezing, or chest pain  He is using Flonase and in the past used Astelin nasal spray  Additionally he needs a refill of Lunesta 3 mg  that he takes for chronic insomnia.  He does report being well and taking medication with no side effects.  Review of Systems See HPI   Past Medical History:  Diagnosis Date   Acute meniscal tear of knee left   Arthritis    generalized   AV block 08/01/2018   Benign essential hypertension 01/18/2007   Bronchiectasis with acute exacerbation 06/23/2020   Chest pain, atypical 08/23/2014   Chronic cough 10/07/2014   Coronary artery disease    Degeneration of lumbar intervertebral disc 10/14/2017   Diverticulosis of colon 07/07/2005   Elevated PSA 06/19/2014   GERD (gastroesophageal reflux disease) 01/18/2007   Gout, unspecified 01/18/2007   Hemorrhoids 01/19/2011   Hiatal hernia    History of colonic polyps 01/18/2007   Insomnia, unspecified 08/21/2007   Iron deficiency anemia, unspecified  01/19/2011   Lumbar post-laminectomy syndrome 10/14/2017   Lumbar spondylolysis    Lumbar strain 12/14/2011   Obstructive sleep apnea 01/18/2007   uses CPAP nightly   Paraesophageal hernia    large   Primary osteoarthritis of knee 05/23/2015   Prostate cancer ([place]) 2016   S/P knee replacement 05/23/2015   Sensorineural hearing loss, bilateral    Vitamin B12 deficiency    Vitamin D deficiency 10/28/2009    Social History   Socioeconomic History   Marital status: Widowed    Spouse name: Not on file   Number of children: 4   Years of education: 94   Highest education level: Some college, no degree  Occupational History   Occupation: retired    Fish farm manager: [employer]  Tobacco Use   Smoking status: Former    Packs/day: 2.00    Years: 20.00    Total  pack years: 40.00    Types: Cigarettes    Quit date: 07/05/1974    Years since quitting: 47.6   Smokeless tobacco: Never  Vaping Use   Vaping Use: Never used  Substance and Sexual Activity   Alcohol use: Not Currently    Alcohol/week: 30.0 standard drinks of alcohol    Types: 30 Standard drinks or equivalent per week     Comment: 4-5 glasses of wine or 2-3 beers nightly   Drug use: No   Sexual activity: Not Currently  Other Topics Concern   Not on file  Social History Narrative   Recently widowed   Former Smoker    Alcohol use-yes 1-2 drinks per day      Occupation: Retired Midwife      Originally from [place], [place] - in [place] > 10 yrs as of 2017      Social Determinants of Health   Financial Resource Strain: [place]  (07/28/2021)   Overall Financial Resource Strain (CARDIA)    Difficulty of Paying Living Expenses: Not hard at all  Food Insecurity: No Food Insecurity (07/28/2021)   Hunger Vital Sign    Worried About Running Out of Food in the Last Year: Never true    [place]ergoo in the Last Year: Never true  Transportation Needs: No Transportation Needs (07/28/2021)   PRAPARE - Hydrologist (Medical): No    Lack of Transportation (Non-Medical): No  Physical Activity: Inactive (07/28/2021)   Exercise Vital Sign    Days of Exercise per Week: 0 days    Minutes of Exercise per Session: 0 min  Stress: No Stress Concern Present (07/28/2021)   Will    Feeling of Stress : Not at all  Social Connections: Moderately Isolated (08/04/2020)   Social Connection and Isolation Panel [NHANES]    Frequency of Communication with Friends and Family: Three times a week    Frequency of Social Gatherings with Friends and Family: More than three times a week    Attends Religious Services: More than 4 times per year    Active Member of Clubs or Organizations: No    Attends Archivist Meetings: Never    Marital Status: Widowed  Intimate Partner Violence: Not At Risk (08/04/2020)   Humiliation, Afraid, Rape, and Kick questionnaire    Fear of Current or Ex-Partner: No    Emotionally Abused: No    Physically Abused: No    Sexually Abused: No    Past Surgical History:  Procedure Laterality Date   BRONCHIAL  WASHINGS  10/30/2019   Procedure: BRONCHIAL WASHINGS;  Surgeon: Julian Hy, DO;  [facility];  Service: Endoscopy;;   CARDIAC CATHETERIZATION     CATARACT EXTRACTION W/ INTRAOCULAR LENS  IMPLANT, BILATERAL Bilateral    COLONOSCOPY     ESOPHAGOGASTRODUODENOSCOPY     KNEE ARTHROSCOPY Right 2007   KNEE ARTHROSCOPY  07/13/2011   Procedure: ARTHROSCOPY KNEE;  Surgeon: Cynda Familia;  Location: [facility];  Service: Orthopedics;  Laterality: Left;  partial menisectomy with chondrylplasty   KNEE SURGERY Left    LAMINECTOMY AND MICRODISCECTOMY LUMBAR SPINE  MARCH  2008   L3 -  4   LAPAROSCOPIC INCISIONAL / UMBILICAL / VENTRAL HERNIA REPAIR  2006   PACEMAKER IMPLANT N/A 08/02/2018   Procedure: PACEMAKER IMPLANT;  Surgeon: Evans Lance, MD;  Location: [facility];  Service: Cardiovascular;  Laterality:  N/A;   PROSTATE BIOPSY     RADIOACTIVE SEED IMPLANT N/A 02/12/2019   Procedure: RADIOACTIVE SEED IMPLANT/BRACHYTHERAPY IMPLANT;  Surgeon: Franchot Gallo, MD;  [facility];  Service: Urology;  Laterality: N/A;  90 MINS   SHOULDER ARTHROSCOPY Right 05-23-2007   SIGMOID COLECTOMY FOR CANCER  1989   SPACE OAR INSTILLATION N/A 02/12/2019   Procedure: SPACE OAR INSTILLATION;  Surgeon: Franchot Gallo, MD;  [facility];  Service: Urology;  Laterality: N/A;   TOTAL KNEE ARTHROPLASTY Left 05/23/2015   Procedure: LEFT TOTAL KNEE ARTHROPLASTY;  Surgeon: Sydnee Cabal, MD;  [facility];  Service: Orthopedics;  Laterality: Left;   VERTICAL BANDED GASTROPLASTY  1986   VIDEO BRONCHOSCOPY N/A 10/30/2019   Procedure: VIDEO BRONCHOSCOPY WITH BRONICAL ALVEROLAR LAVAGE WITHOUT FLUORO;  Surgeon: Julian Hy, DO;  [facility];  Service: Endoscopy;  Laterality: N/A;    Family History  Problem Relation Age of Onset   Stroke Paternal Grandfather    Heart disease Paternal Grandfather    Esophagitis Father        died from perforated esophagus    Breast cancer Mother    Colon cancer Maternal Grandmother        questionable   Esophageal cancer Neg Hx    Prostate cancer Neg Hx    Stomach cancer Neg Hx     Allergies  Allergen Reactions   Contrast Media [Iodinated Contrast Media] Palpitations    TACHYCARDIA- patient states he has tolerated newer agents since this reaction >30 yrs ago    Current Outpatient Medications on File Prior to Visit  Medication Sig Dispense Refill   allopurinol (ZYLOPRIM) 300 MG tablet TAKE 1 TABLET BY MOUTH EVERY DAY 90 tablet 0   amLODipine (NORVASC) 10 MG tablet TAKE 1 TABLET BY MOUTH EVERY DAY 90 tablet 0   aspirin EC 81 MG tablet Take 1 tablet (81 mg total) by mouth daily. Swallow whole. 90 tablet 3   Calcium Carb-Cholecalciferol (CALCIUM 600 + D PO) Take 1 tablet by mouth daily. 800 units of vitamin D     docusate sodium (COLACE) 100 MG capsule Take 100 mg by mouth daily.      Multiple Vitamin (MULTIVITAMIN) tablet Take 1 tablet by mouth daily.     omeprazole (PRILOSEC) 40 MG capsule TAKE 1 CAPSULE (40 MG TOTAL) BY MOUTH DAILY. 90 capsule 0   potassium gluconate 595 (99 K) MG TABS tablet Take 595 mg by mouth daily.     Respiratory Therapy Supplies (FLUTTER) DEVI 1 each by Does not apply route in the morning and at bedtime. 1 each 0   tamsulosin (FLOMAX) 0.4 MG CAPS capsule Take 0.4 mg by mouth daily.     valsartan-hydrochlorothiazide (DIOVAN-HCT) 320-25 MG tablet TAKE 1 TABLET BY MOUTH DAILY. **DUE FOR YEARLY PHYSICAL** 90 tablet 0   vitamin B-12 (CYANOCOBALAMIN) 1000 MCG tablet Take 1,000 mcg by mouth daily.      rosuvastatin (CRESTOR) 20 MG tablet Take 1 tablet (20 mg total) by mouth daily. 90 tablet 3   No current facility-administered medications on file prior to visit.    BP (!) 150/80   Pulse (!) 59   Temp 98 F (36.7 C) (Oral)   Ht '5\' 8"'$  (1.727 m)   Wt 241 lb (109.3 kg)   SpO2 98%   BMI 36.64 kg/m       Objective:   Physical Exam Vitals and nursing note reviewed.   Constitutional:      Appearance: Normal appearance.  HENT:     Nose: Congestion present. No rhinorrhea.     Mouth/Throat:     Mouth: Mucous membranes are moist.  Cardiovascular:     Rate and Rhythm: Normal rate and regular rhythm.     Pulses: Normal pulses.     Heart sounds: Normal heart sounds.  Pulmonary:     Effort: Pulmonary effort is normal.     Breath sounds: Normal breath sounds.  Skin:    General: Skin is warm and dry.     Capillary Refill: Capillary refill takes less than 2 seconds.  Neurological:     General: No focal deficit present.     Mental Status: He is alert and oriented to person, place, and time.  Psychiatric:        Mood and Affect: Mood normal.        Behavior: Behavior normal.        Thought Content: Thought content normal.        Judgment: Judgment normal.        Assessment & Plan:   1. Primary insomnia  - eszopiclone 3 MG TABS; Take 1 tablet (3 mg total) by mouth at bedtime. Take immediately before bedtime PRN  Dispense: 30 tablet; Refill: 2  2. Chronic cough  - CT Maxillofacial WO CM; Future   Dorothyann Peng, NP

## 2022-03-02 ENCOUNTER — Other Ambulatory Visit: Payer: Self-pay | Admitting: Adult Health

## 2022-03-15 ENCOUNTER — Ambulatory Visit
Admission: RE | Admit: 2022-03-15 | Discharge: 2022-03-15 | Disposition: A | Discharge status: Home / Self Care | Payer: Medicare Other | Source: Ambulatory Visit | Attending: Adult Health | Admitting: Adult Health

## 2022-03-15 DIAGNOSIS — J3489 Other specified disorders of nose and nasal sinuses: Secondary | ICD-10-CM | POA: Diagnosis not present

## 2022-03-15 DIAGNOSIS — R059 Cough, unspecified: Secondary | ICD-10-CM | POA: Diagnosis not present

## 2022-03-15 DIAGNOSIS — R053 Chronic cough: Secondary | ICD-10-CM

## 2022-03-15 DIAGNOSIS — Z9889 Other specified postprocedural states: Secondary | ICD-10-CM | POA: Diagnosis not present

## 2022-04-01 ENCOUNTER — Ambulatory Visit (INDEPENDENT_AMBULATORY_CARE_PROVIDER_SITE_OTHER): Payer: Medicare Other | Admitting: Primary Care

## 2022-04-01 ENCOUNTER — Ambulatory Visit (INDEPENDENT_AMBULATORY_CARE_PROVIDER_SITE_OTHER): Payer: Medicare Other

## 2022-04-01 ENCOUNTER — Encounter: Payer: Self-pay | Admitting: Primary Care

## 2022-04-01 VITALS — BP 140/78 | HR 60 | Temp 98.0°F | Ht 68.0 in | Wt 242.6 lb

## 2022-04-01 DIAGNOSIS — J9811 Atelectasis: Secondary | ICD-10-CM | POA: Diagnosis not present

## 2022-04-01 DIAGNOSIS — J479 Bronchiectasis, uncomplicated: Secondary | ICD-10-CM

## 2022-04-01 DIAGNOSIS — R053 Chronic cough: Secondary | ICD-10-CM | POA: Diagnosis not present

## 2022-04-01 MED ORDER — SODIUM CHLORIDE 3 % IN NEBU: INHALATION_SOLUTION | RESPIRATORY_TRACT | 1 refills | Status: DC | PRN

## 2022-04-01 NOTE — Patient Instructions (Addendum)
Recommendations: - Start mucinex '600mg'$  - take twice daily (morning and evening) to loosen congestion  - Use flutter valve 5-10 breaths three times a day to loosen congestion - Start hypertonic saline nebulizer twice daily   Orders: - Flutter valve - New nebulizer machine  - Sputum sample x2 (ordered) - CXR today (ordered)    Follow-up: - 4-8 weeks with Dr. Elsworth Soho (he has openings end of October, otherwise put in recall for first available with him)    Bronchiectasis  Bronchiectasis is a condition in which the airways in the lungs (bronchi) are damaged and widened. The condition makes it hard for the lungs to get rid of mucus, and it causes mucus to gather in the bronchi. This condition often leads to lung infections, which can make the condition worse. What are the causes? You can be born with this condition, or you can develop it later in life. Common causes of this condition include: Cystic fibrosis. Repeated lung infections, such as pneumonia or tuberculosis. An object or other blockage in the lungs. Breathing in fluid, food, or other objects (aspiration). A problem with the body's defense system (immune system) and lung structure that is present at birth (congenital). Sometimes the cause is not known. What are the signs or symptoms? Common symptoms of this condition include: A daily cough that brings up mucus and lasts for more than 3 weeks. Lung infections that happen often. Shortness of breath and wheezing. Weakness and feeling tired (fatigue). How is this diagnosed? This condition is diagnosed with tests, such as: Chest X-rays or CT scans. These are done to check for changes in the lungs. Breathing tests. These are done to check how well your lungs are working. A test of a sample of your saliva (sputum culture). This test is done to check for infection. Blood tests and other tests. These are done to check for related diseases or causes. How is this treated? Treatment for  this condition depends on the severity of the illness and its cause. Treatment may include: Medicines that loosen mucus so it can be coughed up (mucolytics). Medicines that relax the muscles of the bronchi (bronchodilators). Antibiotic medicines to prevent or treat infection. Physical therapy to help clear mucus from the lungs. Techniques may include: Postural drainage. This is when you sit or lie in certain positions so that mucus can drain by gravity. Chest percussion. This involves tapping the chest or back with a cupped hand. Chest vibration. For this therapy, a hand or special equipment vibrates your chest and back. Surgery to remove the affected part of the lung. This may be done in severe cases. Follow these instructions at home: Medicines Take over-the-counter and prescription medicines only as told by your health care provider. If you were prescribed an antibiotic medicine, take it as told by your health care provider. Do not stop taking the antibiotic even if you start to feel better. Avoid taking sedatives and antihistamines unless your health care provider tells you to take them. These medicines tend to thicken the mucus in the lungs. Managing symptoms Do breathing exercises or techniques to clear your lungs as told by your health care provider. Consider using a cold steam vaporizer or humidifier in your room or home to help loosen secretions. If you have a cough that gets worse at night, try sleeping in a semi-upright position. General instructions Get plenty of rest. Drink enough fluid to keep your urine pale yellow. Stay inside when pollution and ozone levels are high. Stay up  to date with vaccinations and immunizations. Avoid cigarette smoke and other lung irritants. Do not use any products that contain nicotine or tobacco. These products include cigarettes, chewing tobacco, and vaping devices, such as e-cigarettes. If you need help quitting, ask your health care  provider. Keep all follow-up visits. This is important. Contact a health care provider if: You cough up more sputum than before and the sputum is yellow or green in color. You have a fever or chills. You cannot control your cough and are losing sleep. Get help right away if: You cough up blood. You have chest pain. You have increasing shortness of breath. You have pain that gets worse or is not controlled with medicines. You have a fever and your symptoms suddenly get worse. These symptoms may be an emergency. Get help right away. Call 911. Do not wait to see if the symptoms will go away. Do not drive yourself to the hospital. Summary Bronchiectasis is a condition in which the airways in the lungs (bronchi) are damaged and widened. The condition makes it hard for the lungs to get rid of mucus, and it causes mucus to gather in the bronchi. Treatment usually includes therapy to help clear mucus from the lungs. Avoid cigarette smoke and other lung irritants. Stay up to date with vaccinations and immunizations. This information is not intended to replace advice given to you by your health care provider. Make sure you discuss any questions you have with your health care provider. Document Revised: 03/04/2021 Document Reviewed: 01/20/2021 Elsevier Patient Education  [employer].

## 2022-04-01 NOTE — Progress Notes (Signed)
_0  ID: Marcus [name], male    DOB: 1941/02/29, 81 y.o.   MRN: [id number]  Chief Complaint  Patient presents with   Follow-up    C/o cough off/on yellow, white, waking up,sob-same, denies fever    Referring provider: Dorothyann Peng, NP  HPI: 81 year old male, smoker.  Past medical history significant for bronchiectasis, obstructive sleep apnea, hiatal hernia, GERD, hypertension, coronary artery disease, pacemaker.  Patient of Dr. Elsworth Soho, last seen in office on 08/07/2020.  He has minimal bronchiectasis right upper lobe with some groundglass opacity noted on prior CT imaging felt to be due to reflux or aspiration.  Lung function has been normal.  04/01/2022 Patient presents today for acute OV d.t cough. He reports having a chronic cough for several years. Not acutely worse. Cough is productive with thick mucus. No purulence. He is not taking anything for cough. He has mild bronchiectasis on CT imaging to bilateral lower lobes. No fevers.   Significant tests/ events reviewed   PFTs 08/2020 nml   10/30/2019 BAL cell count with differential: 185 cells, 2% PMNs, 80% monocytes, 15% lymphocytes   Micro: 10/30/19 BAL culture-normal flora 10/30/19 BAL fungus-KOH smear negative 10/30/2019 BAL AFB- negative   Chest Imaging- films reviewed: CXR, 2 view 08/21/2019-elevated left hemidiaphragm, pacemaker in place.  Mild kyphosis with flattened hemidiaphragms most notable posteriorly.   CT chest 01/17/2015-lung parenchyma, no significant mediastinal adenopathy.  Senescent vascular calcification.   HRCT chest 09/27/19- Scattered groundglass opacities and tree-in-bud opacities.  Bilateral basilar linear scars.  Mild bronchiectasis bilateral lower lobes, right middle lobe, right upper lobe, lingula.  Minimal air-trapping.  No significant mediastinal or hilar adenopathy.  Likely small hiatal hernia.  Aortic valve, three-vessel coronary, and aortic calcifications.   Allergies  Allergen Reactions    Contrast Media [Iodinated Contrast Media] Palpitations    TACHYCARDIA- patient states he has tolerated newer agents since this reaction >30 yrs ago    Immunization History  Administered Date(s) Administered   Influenza Whole 06/05/2007, 04/30/2008, 03/31/2009   Influenza, High Dose Seasonal PF 06/19/2014, 07/30/2015, 03/03/2017   Influenza,inj,quad, With Preservative 04/04/2017   Moderna Sars-Covid-2 Vaccination 07/03/2020   PFIZER(Purple Top)SARS-COV-2 Vaccination 08/13/2019, 09/11/2019   Pneumococcal Conjugate-13 06/19/2014, 07/30/2015   Pneumococcal Polysaccharide-23 12/11/2003, 03/03/2017   Td 03/31/2009   Tdap 10/19/2019, 03/24/2021   Zoster Recombinat (Shingrix) 07/17/2020, 09/15/2020    Past Medical History:  Diagnosis Date   Acute meniscal tear of knee left   Arthritis    generalized   AV block 08/01/2018   Benign essential hypertension 01/18/2007   Bronchiectasis with acute exacerbation 06/23/2020   Chest pain, atypical 08/23/2014   Chronic cough 10/07/2014   Coronary artery disease    Degeneration of lumbar intervertebral disc 10/14/2017   Diverticulosis of colon 07/07/2005   Elevated PSA 06/19/2014   GERD (gastroesophageal reflux disease) 01/18/2007   Gout, unspecified 01/18/2007   Hemorrhoids 01/19/2011   Hiatal hernia    History of colonic polyps 01/18/2007   Insomnia, unspecified 08/21/2007   Iron deficiency anemia, unspecified  01/19/2011   Lumbar post-laminectomy syndrome 10/14/2017   Lumbar spondylolysis    Lumbar strain 12/14/2011   Obstructive sleep apnea 01/18/2007   uses CPAP nightly   Paraesophageal hernia    large   Primary osteoarthritis of knee 05/23/2015   Prostate cancer ([place]) 2016   S/P knee replacement 05/23/2015   Sensorineural hearing loss, bilateral    Vitamin B12 deficiency    Vitamin D deficiency 10/28/2009    Tobacco History: Social History  Tobacco Use  Smoking Status Former   Packs/day: 2.00   Years: 20.00   Total pack  years: 40.00   Types: Cigarettes   Quit date: 07/05/1974   Years since quitting: 47.7  Smokeless Tobacco Never   Counseling given: Not Answered   Outpatient Medications Prior to Visit  Medication Sig Dispense Refill   allopurinol (ZYLOPRIM) 300 MG tablet TAKE 1 TABLET BY MOUTH EVERY DAY 90 tablet 0   amLODipine (NORVASC) 10 MG tablet TAKE 1 TABLET BY MOUTH EVERY DAY 90 tablet 0   aspirin EC 81 MG tablet Take 1 tablet (81 mg total) by mouth daily. Swallow whole. 90 tablet 3   Calcium Carb-Cholecalciferol (CALCIUM 600 + D PO) Take 1 tablet by mouth daily. 800 units of vitamin D     docusate sodium (COLACE) 100 MG capsule Take 100 mg by mouth daily.      eszopiclone 3 MG TABS Take 1 tablet (3 mg total) by mouth at bedtime. Take immediately before bedtime PRN 30 tablet 2   Multiple Vitamin (MULTIVITAMIN) tablet Take 1 tablet by mouth daily.     omeprazole (PRILOSEC) 40 MG capsule TAKE 1 CAPSULE (40 MG TOTAL) BY MOUTH DAILY. 90 capsule 0   potassium gluconate 595 (99 K) MG TABS tablet Take 595 mg by mouth daily.     tamsulosin (FLOMAX) 0.4 MG CAPS capsule Take 0.4 mg by mouth daily.     valsartan-hydrochlorothiazide (DIOVAN-HCT) 320-25 MG tablet TAKE 1 TABLET BY MOUTH DAILY. **DUE FOR YEARLY PHYSICAL** 90 tablet 0   vitamin B-12 (CYANOCOBALAMIN) 1000 MCG tablet Take 1,000 mcg by mouth daily.      Respiratory Therapy Supplies (FLUTTER) DEVI 1 each by Does not apply route in the morning and at bedtime. (Patient not taking: Reported on 04/01/2022) 1 each 0   rosuvastatin (CRESTOR) 20 MG tablet Take 1 tablet (20 mg total) by mouth daily. 90 tablet 3   No facility-administered medications prior to visit.   Review of Systems  Review of Systems  Constitutional: Negative.  Negative for chills and fever.  HENT:  Positive for congestion.   Respiratory:  Positive for cough. Negative for shortness of breath.    Physical Exam  BP (!) 140/78 (BP Location: Left Arm, Cuff Size: Normal)   Pulse 60    Temp 98 F (36.7 C) (Temporal)   Ht _0  (1.727 m)   Wt 242 lb 9.6 oz (110 kg)   SpO2 99%   BMI 36.89 kg/m  Physical Exam Constitutional:      General: He is not in acute distress.    Appearance: Normal appearance. He is not ill-appearing.  HENT:     Head: Normocephalic and atraumatic.  Cardiovascular:     Rate and Rhythm: Normal rate.  Pulmonary:     Effort: Pulmonary effort is normal.     Breath sounds: No wheezing, rhonchi or rales.     Comments: No overt wheezing or rhonchi Skin:    General: Skin is warm.  Neurological:     General: No focal deficit present.     Mental Status: He is alert and oriented to person, place, and time. Mental status is at baseline.  Psychiatric:        Mood and Affect: Mood normal.        Behavior: Behavior normal.        Thought Content: Thought content normal.        Judgment: Judgment normal.      Lab Results:  CBC  Component Value Date/Time   WBC 5.5 11/04/2021 1331   RBC 4.43 11/04/2021 1331   HGB 13.4 11/04/2021 1331   HCT 40.2 11/04/2021 1331   PLT 197.0 11/04/2021 1331   MCV 90.7 11/04/2021 1331   MCH 29.4 03/05/2020 1431   MCHC 33.4 11/04/2021 1331   RDW 15.6 (H) 11/04/2021 1331   LYMPHSABS 1.3 11/04/2021 1331   MONOABS 0.4 11/04/2021 1331   EOSABS 0.1 11/04/2021 1331   BASOSABS 0.0 11/04/2021 1331    BMET    Component Value Date/Time   NA 141 11/04/2021 1331   K 4.2 11/04/2021 1331   CL 104 11/04/2021 1331   CO2 30 11/04/2021 1331   GLUCOSE 102 (H) 11/04/2021 1331   BUN 23 11/04/2021 1331   CREATININE 1.12 11/04/2021 1331   CREATININE 1.24 01/01/2011 1609   CALCIUM 9.2 11/04/2021 1331   GFRNONAA >60 02/07/2019 1446   GFRAA >60 02/07/2019 1446    BNP No results found for: "BNP"  ProBNP    Component Value Date/Time   PROBNP 5.3 04/20/2010 2247    Imaging: DG Chest 2 View  Result Date: 04/01/2022 CLINICAL DATA:  Chronic cough. EXAM: CHEST - 2 VIEW COMPARISON:  Chest x-ray 06/23/2020 and prior  chest CT 09/27/2019 FINDINGS: The pacer wires are and good position, unchanged. The cardiac silhouette, mediastinal and hilar contours are within normal limits and stable. Mild eventration of both hemidiaphragms with some overlying vascular crowding and atelectasis but no infiltrates, edema or effusions. No pulmonary lesions. The bony thorax is intact. IMPRESSION: Mild eventration of both hemidiaphragms with overlying vascular crowding and atelectasis. No infiltrates, edema or effusions. Electronically Signed   By: Marijo Sanes M.D.   On: 04/01/2022 11:24   CT Maxillofacial WO CM  Result Date: 03/15/2022 CLINICAL DATA:  Chronic cough and clogged left nostril EXAM: CT MAXILLOFACIAL WITHOUT CONTRAST TECHNIQUE: Multidetector CT imaging of the maxillofacial structures was performed. Multiplanar CT image reconstructions were also generated. RADIATION DOSE REDUCTION: This exam was performed according to the departmental dose-optimization program which includes automated exposure control, adjustment of the mA and/or kV according to patient size and/or use of iterative reconstruction technique. COMPARISON:  CT head 05/27/2020 FINDINGS: Osseous: No fracture or mandibular dislocation. No destructive process. Orbits: Negative. No traumatic or inflammatory finding. Postoperative changes both globes. No acute finding. Sinuses: Mild mucoperiosteal thickening about the left nasal turbinates. Mild mucosal thickening or mucous retention cyst in the left maxillary sinus. Minimal mucosal thickening right maxillary sinus. Mild mucosal thickening in the ethmoid air cells. The frontal and sphenoid sinuses are well aerated. The sinus ostia are patent. No mastoid effusions. Soft tissues: Negative. Limited intracranial: No significant or unexpected finding. IMPRESSION: No acute abnormality. Mild mucoperiosteal thickening about the paranasal sinuses. Electronically Signed   By: Placido Sou M.D.   On: 03/15/2022 19:51      Assessment & Plan:   Bronchiectasis without complication ([place]) - Patient has chronic cough > 6 months, not acutely exacerbated. He has mild bronchiectasis on CT imaging to bilateral lower lobes.  Recommend checking respiratory sputum sample and AF along with getting updated chest xray. We will decide if he needs CT imaging at follow-up. Advised he start mucinex 648m twice daily. He declined flutter valve d.t cost. We will send in prescription for him to start hypertonic saline nebulizer twice daily to help loosen congestion/mobilize secretions. FU in 6 weeks with Dr. AElsworth Sohoor sooner if needed.      EMartyn Ehrich NP 04/03/2022

## 2022-04-03 DIAGNOSIS — J479 Bronchiectasis, uncomplicated: Secondary | ICD-10-CM | POA: Insufficient documentation

## 2022-04-03 NOTE — Progress Notes (Signed)
Please let patient know CXR showed mild elevated diaphragm and some congestion in his lungs, no acute infiltrates, edema or effusions. We may consider CT chest imaging  at follow-up if symptoms do not improve with mucinex and nebulizer

## 2022-04-03 NOTE — Assessment & Plan Note (Addendum)
-   Patient has chronic cough > 6 months, not acutely exacerbated. He has mild bronchiectasis on CT imaging to bilateral lower lobes.  Recommend checking respiratory sputum sample and AF along with getting updated chest xray. We will decide if he needs CT imaging at follow-up. Advised he start mucinex '600mg'$  twice daily. He declined flutter valve d.t cost. We will send in prescription for him to start hypertonic saline nebulizer twice daily to help loosen congestion/mobilize secretions. FU in 6 weeks with Dr. Elsworth Soho or sooner if needed.

## 2022-04-12 ENCOUNTER — Telehealth: Payer: Self-pay

## 2022-04-12 NOTE — Telephone Encounter (Signed)
Spoke with patient.  He is out of town in [place] for the next month and does not have his remote monitor with him.  Will follow up when patient returns and remote transmissions resume.

## 2022-04-19 ENCOUNTER — Telehealth: Payer: Self-pay | Admitting: Primary Care

## 2022-04-19 NOTE — Telephone Encounter (Signed)
Left message for patient to call back  

## 2022-04-27 ENCOUNTER — Ambulatory Visit (INDEPENDENT_AMBULATORY_CARE_PROVIDER_SITE_OTHER): Payer: Medicare Other | Admitting: Adult Health

## 2022-04-27 ENCOUNTER — Encounter: Payer: Self-pay | Admitting: Adult Health

## 2022-04-27 VITALS — BP 140/70 | HR 60 | Temp 98.8°F | Ht 68.0 in | Wt 239.0 lb

## 2022-04-27 DIAGNOSIS — R079 Chest pain, unspecified: Secondary | ICD-10-CM

## 2022-04-27 NOTE — Progress Notes (Signed)
Subjective:    Patient ID: Marcus [name], male    DOB: May 15, 1941, 81 y.o.   MRN: [id number]  HPI 81 year old male who  has a past medical history of Acute meniscal tear of knee (left), Arthritis, AV block (08/01/2018), Benign essential hypertension (01/18/2007), Bronchiectasis with acute exacerbation (06/23/2020), Chest pain, atypical (08/23/2014), Chronic cough (10/07/2014), Coronary artery disease, Degeneration of lumbar intervertebral disc (10/14/2017), Diverticulosis of colon (07/07/2005), Elevated PSA (06/19/2014), GERD (gastroesophageal reflux disease) (01/18/2007), Gout, unspecified (01/18/2007), Hemorrhoids (01/19/2011), Hiatal hernia, History of colonic polyps (01/18/2007), Insomnia, unspecified (08/21/2007), Iron deficiency anemia, unspecified  (01/19/2011), Lumbar post-laminectomy syndrome (10/14/2017), Lumbar spondylolysis, Lumbar strain (12/14/2011), Obstructive sleep apnea (01/18/2007), Paraesophageal hernia, Primary osteoarthritis of knee (05/23/2015), Prostate cancer ([place]) (2016), S/P knee replacement (05/23/2015), Sensorineural hearing loss, bilateral, Vitamin B12 deficiency, and Vitamin D deficiency (10/28/2009).  He was on a trip in [place] for four weeks, 6 days ago towards the end of the trip he developed about 24 hours of let sided chest pain that is described as " sharp and muscular like". Pain was non radiating. He was able to do some stretching exercises and when manipulating his arm the pain resolved. He has not experienced  pain since. He denies any trauma or doing anything physical that could have caused the pain. Did not have any nausea or vomiting.   He does have a cardiologist who he sees on a regular basis due to history of CAD, Complete heart block with pacemaker, and hypertension.    Review of Systems See HPI   Past Medical History:  Diagnosis Date   Acute meniscal tear of knee left   Arthritis    generalized   AV block 08/01/2018   Benign essential hypertension  01/18/2007   Bronchiectasis with acute exacerbation 06/23/2020   Chest pain, atypical 08/23/2014   Chronic cough 10/07/2014   Coronary artery disease    Degeneration of lumbar intervertebral disc 10/14/2017   Diverticulosis of colon 07/07/2005   Elevated PSA 06/19/2014   GERD (gastroesophageal reflux disease) 01/18/2007   Gout, unspecified 01/18/2007   Hemorrhoids 01/19/2011   Hiatal hernia    History of colonic polyps 01/18/2007   Insomnia, unspecified 08/21/2007   Iron deficiency anemia, unspecified  01/19/2011   Lumbar post-laminectomy syndrome 10/14/2017   Lumbar spondylolysis    Lumbar strain 12/14/2011   Obstructive sleep apnea 01/18/2007   uses CPAP nightly   Paraesophageal hernia    large   Primary osteoarthritis of knee 05/23/2015   Prostate cancer ([place]) 2016   S/P knee replacement 05/23/2015   Sensorineural hearing loss, bilateral    Vitamin B12 deficiency    Vitamin D deficiency 10/28/2009    Social History   Socioeconomic History   Marital status: Widowed    Spouse name: Not on file   Number of children: 4   Years of education: 11   Highest education level: Some college, no degree  Occupational History   Occupation: retired    Fish farm manager: [employer]  Tobacco Use   Smoking status: Former    Packs/day: 2.00    Years: 20.00    Total pack years: 40.00    Types: Cigarettes    Quit date: 07/05/1974    Years since quitting: 47.8   Smokeless tobacco: Never  Vaping Use   Vaping Use: Never used  Substance and Sexual Activity   Alcohol use: Not Currently    Alcohol/week: 30.0 standard drinks of alcohol    Types: 30 Standard drinks or equivalent  per week    Comment: 4-5 glasses of wine or 2-3 beers nightly   Drug use: No   Sexual activity: Not Currently  Other Topics Concern   Not on file  Social History Narrative   Recently widowed   Former Smoker    Alcohol use-yes 1-2 drinks per day      Occupation: Retired Midwife      Originally from  [place], [place] - in [place] > 10 yrs as of 2017      Social Determinants of Health   Financial Resource Strain: [place]  (07/28/2021)   Overall Financial Resource Strain (CARDIA)    Difficulty of Paying Living Expenses: Not hard at all  Food Insecurity: No Food Insecurity (07/28/2021)   Hunger Vital Sign    Worried About Running Out of Food in the Last Year: Never true    [place] in the Last Year: Never true  Transportation Needs: No Transportation Needs (07/28/2021)   PRAPARE - Hydrologist (Medical): No    Lack of Transportation (Non-Medical): No  Physical Activity: Inactive (07/28/2021)   Exercise Vital Sign    Days of Exercise per Week: 0 days    Minutes of Exercise per Session: 0 min  Stress: No Stress Concern Present (07/28/2021)   [place]    Feeling of Stress : Not at all  Social Connections: Moderately Isolated (08/04/2020)   Social Connection and Isolation Panel [NHANES]    Frequency of Communication with Friends and Family: Three times a week    Frequency of Social Gatherings with Friends and Family: More than three times a week    Attends Religious Services: More than 4 times per year    Active Member of Clubs or Organizations: No    Attends Archivist Meetings: Never    Marital Status: Widowed  Intimate Partner Violence: Not At Risk (08/04/2020)   Humiliation, Afraid, Rape, and Kick questionnaire    Fear of Current or Ex-Partner: No    Emotionally Abused: No    Physically Abused: No    Sexually Abused: No    Past Surgical History:  Procedure Laterality Date   BRONCHIAL WASHINGS  10/30/2019   Procedure: BRONCHIAL WASHINGS;  Surgeon: Julian Hy, DO;  Location: [facility];  Service: Endoscopy;;   CARDIAC CATHETERIZATION     CATARACT EXTRACTION W/ INTRAOCULAR LENS  IMPLANT, BILATERAL Bilateral    COLONOSCOPY     ESOPHAGOGASTRODUODENOSCOPY     KNEE  ARTHROSCOPY Right 2007   KNEE ARTHROSCOPY  07/13/2011   Procedure: ARTHROSCOPY KNEE;  Surgeon: Cynda Familia;  Location: [facility];  Service: Orthopedics;  Laterality: Left;  partial menisectomy with chondrylplasty   KNEE SURGERY Left    LAMINECTOMY AND MICRODISCECTOMY LUMBAR SPINE  MARCH  2008   L3 -  4   LAPAROSCOPIC INCISIONAL / UMBILICAL / VENTRAL HERNIA REPAIR  2006   PACEMAKER IMPLANT N/A 08/02/2018   Procedure: PACEMAKER IMPLANT;  Surgeon: Evans Lance, MD;  Location: [facility];  Service: Cardiovascular;  Laterality: N/A;   PROSTATE BIOPSY     RADIOACTIVE SEED IMPLANT N/A 02/12/2019   Procedure: RADIOACTIVE SEED IMPLANT/BRACHYTHERAPY IMPLANT;  Surgeon: Franchot Gallo, MD;  Location: [facility];  Service: Urology;  Laterality: N/A;  90 MINS   SHOULDER ARTHROSCOPY Right 05-23-2007   SIGMOID COLECTOMY FOR CANCER  1989   SPACE OAR INSTILLATION N/A 02/12/2019   Procedure: SPACE  OAR INSTILLATION;  Surgeon: Franchot Gallo, MD;  Location: [facility];  Service: Urology;  Laterality: N/A;   TOTAL KNEE ARTHROPLASTY Left 05/23/2015   Procedure: LEFT TOTAL KNEE ARTHROPLASTY;  Surgeon: Sydnee Cabal, MD;  Location: [facility];  Service: Orthopedics;  Laterality: Left;   VERTICAL BANDED GASTROPLASTY  1986   VIDEO BRONCHOSCOPY N/A 10/30/2019   Procedure: VIDEO BRONCHOSCOPY WITH BRONICAL ALVEROLAR LAVAGE WITHOUT FLUORO;  Surgeon: Julian Hy, DO;  Location: [facility];  Service: Endoscopy;  Laterality: N/A;    Family History  Problem Relation Age of Onset   Stroke Paternal Grandfather    Heart disease Paternal Grandfather    Esophagitis Father        died from perforated esophagus   Breast cancer Mother    Colon cancer Maternal Grandmother        questionable   Esophageal cancer Neg Hx    Prostate cancer Neg Hx    Stomach cancer Neg Hx     Allergies  Allergen Reactions   Contrast Media [Iodinated Contrast Media] Palpitations    TACHYCARDIA- patient  states he has tolerated newer agents since this reaction >30 yrs ago    Current Outpatient Medications on File Prior to Visit  Medication Sig Dispense Refill   allopurinol (ZYLOPRIM) 300 MG tablet TAKE 1 TABLET BY MOUTH EVERY DAY 90 tablet 0   amLODipine (NORVASC) 10 MG tablet TAKE 1 TABLET BY MOUTH EVERY DAY 90 tablet 0   aspirin EC 81 MG tablet Take 1 tablet (81 mg total) by mouth daily. Swallow whole. 90 tablet 3   Calcium Carb-Cholecalciferol (CALCIUM 600 + D PO) Take 1 tablet by mouth daily. 800 units of vitamin D     docusate sodium (COLACE) 100 MG capsule Take 100 mg by mouth daily.      eszopiclone 3 MG TABS Take 1 tablet (3 mg total) by mouth at bedtime. Take immediately before bedtime PRN 30 tablet 2   Multiple Vitamin (MULTIVITAMIN) tablet Take 1 tablet by mouth daily.     omeprazole (PRILOSEC) 40 MG capsule TAKE 1 CAPSULE (40 MG TOTAL) BY MOUTH DAILY. 90 capsule 0   potassium gluconate 595 (99 K) MG TABS tablet Take 595 mg by mouth daily.     Respiratory Therapy Supplies (FLUTTER) DEVI 1 each by Does not apply route in the morning and at bedtime. 1 each 0   sodium chloride HYPERTONIC 3 % nebulizer solution Take by nebulization as needed for other. 750 mL 1   sodium chloride HYPERTONIC 3 % nebulizer solution Take by nebulization as needed for other. 750 mL 12   tamsulosin (FLOMAX) 0.4 MG CAPS capsule Take 0.4 mg by mouth daily.     valsartan-hydrochlorothiazide (DIOVAN-HCT) 320-25 MG tablet TAKE 1 TABLET BY MOUTH DAILY. **DUE FOR YEARLY PHYSICAL** 90 tablet 0   vitamin B-12 (CYANOCOBALAMIN) 1000 MCG tablet Take 1,000 mcg by mouth daily.      rosuvastatin (CRESTOR) 20 MG tablet Take 1 tablet (20 mg total) by mouth daily. 90 tablet 3   No current facility-administered medications on file prior to visit.    BP (!) 140/70   Temp 98.8 F (37.1 C) (Oral)   Ht '5\' 8"'$  (1.727 m)   Wt 239 lb (108.4 kg)   BMI 36.34 kg/m       Objective:   Physical Exam Vitals and nursing note  reviewed.  Constitutional:      Appearance: Normal appearance.  Cardiovascular:     Rate and Rhythm: Normal rate and  regular rhythm.     Pulses: Normal pulses.     Heart sounds: Normal heart sounds.  Pulmonary:     Effort: Pulmonary effort is normal.     Breath sounds: Normal breath sounds.  Musculoskeletal:        General: No tenderness. Normal range of motion.  Skin:    General: Skin is warm and dry.     Capillary Refill: Capillary refill takes less than 2 seconds.  Neurological:     General: No focal deficit present.     Mental Status: He is alert and oriented to person, place, and time.       Assessment & Plan:  1. Chest pain, unspecified type  - EKG 12-Lead- Sinus Rhythm -First degree A-V block -occasional ectopic ventricular beat   PRi = 262 -Negative T-waves -Anterior ischemia. Rate 61  - Consistent with previous EKGS - Likely muscular.  - Advised follow up with Cardiology if it happens again   Dorothyann Peng, NP

## 2022-05-03 ENCOUNTER — Other Ambulatory Visit: Payer: Medicare Other

## 2022-05-03 ENCOUNTER — Encounter: Payer: Self-pay | Admitting: Primary Care

## 2022-05-03 ENCOUNTER — Ambulatory Visit (INDEPENDENT_AMBULATORY_CARE_PROVIDER_SITE_OTHER): Payer: Medicare Other | Admitting: Primary Care

## 2022-05-03 DIAGNOSIS — R0982 Postnasal drip: Secondary | ICD-10-CM | POA: Diagnosis not present

## 2022-05-03 DIAGNOSIS — J479 Bronchiectasis, uncomplicated: Secondary | ICD-10-CM

## 2022-05-03 MED ORDER — GUAIFENESIN ER 600 MG PO TB12: 600.0000 mg | ORAL_TABLET | Freq: Two times a day (BID) | ORAL | 1 refills | Status: DC | PRN

## 2022-05-03 MED ORDER — IPRATROPIUM BROMIDE 0.03 % NA SOLN: 2.0000 | Freq: Two times a day (BID) | NASAL | 1 refills | Status: DC

## 2022-05-03 NOTE — Assessment & Plan Note (Signed)
-   Patient has had a daily productive cough for the last 6 months. Not acutely exacerbated. He has mild bronchiectasis on CT imaging to bilateral lower lobes.  Chest x-ray in September showed mild eventrtion of both diaphragms with overlying vascular congestion/atelectasis. He is not currently taking expectorants or using flutter valve to mobilize secretions.  He was able to provide [place] with a sputum sample today for respiratory culture and AFB.  Advised he start Mucinex 600 mg twice daily, use flutter valve 3 times daily and start hypertonic saline 1-2 times daily to loosen congestion.  If no improvement with above planned recommend getting CT chest imaging to assess for progression disease. FU with Dr. Elsworth Soho in 3 months or sooner if needed.

## 2022-05-03 NOTE — Progress Notes (Signed)
_0  ID: Marcus [name], male    DOB: 1940/08/17, 81 y.o.   MRN: [id number]  Chief Complaint  Patient presents with   Follow-up    Referring provider: Dorothyann Peng, NP  HPI: 81 year old male, smoker.  Past medical history significant for bronchiectasis, obstructive sleep apnea, hiatal hernia, GERD, hypertension, coronary artery disease, pacemaker.  Patient of Dr. Elsworth Soho, last seen in office on 08/07/2020.  He has minimal bronchiectasis right upper lobe with some groundglass opacity noted on prior CT imaging felt to be due to reflux or aspiration.  Lung function has been normal.  Previous [facility] pulmonary encounter: 04/01/2022 Patient presents today for acute OV d.t cough. He reports having a chronic cough for several years. Not acutely worse. Cough is productive with thick mucus. No purulence. He is not taking anything for cough. He has mild bronchiectasis on CT imaging to bilateral lower lobes. No fevers.   05/03/2022 Patient presents today for 4 week follow-up. He has had a chronic daily cough for several years, worse the last 6 months. Cough is bothersome to him when he is around people. Sputum can be dark in the morning and lighten up through the day. He has mild bronchiectasis on CT imaging bilateral lower lobes.  CXR 04/01/22 showed mild eventrtion of both diaphragms with overlying vascular congestion/atelectasis. Offered hypertonic saline nebs in the past by Dr. Elsworth Soho but patient deferred. We placed an order for him to be provided with a nebulizer machine at last visit but he has not received. He has been in [place] recently. He is not currently taking mucinex or using flutter valve. He was able to provide sputum sample today.   Significant tests/ events reviewed   PFTs 08/2020 nml   10/30/2019 BAL cell count with differential: 185 cells, 2% PMNs, 80% monocytes, 15% lymphocytes   Micro: 10/30/19 BAL culture-normal flora 10/30/19 BAL fungus-KOH smear negative 10/30/2019 BAL AFB-  negative   Chest Imaging- films reviewed: CXR, 2 view 08/21/2019-elevated left hemidiaphragm, pacemaker in place.  Mild kyphosis with flattened hemidiaphragms most notable posteriorly.   CT chest 01/17/2015-lung parenchyma, no significant mediastinal adenopathy.  Senescent vascular calcification.   HRCT chest 09/27/19- Scattered groundglass opacities and tree-in-bud opacities.  Bilateral basilar linear scars.  Mild bronchiectasis bilateral lower lobes, right middle lobe, right upper lobe, lingula.  Minimal air-trapping.  No significant mediastinal or hilar adenopathy.  Likely small hiatal hernia.  Aortic valve, three-vessel coronary, and aortic calcifications.      Allergies  Allergen Reactions   Contrast Media [Iodinated Contrast Media] Palpitations    TACHYCARDIA- patient states he has tolerated newer agents since this reaction >30 yrs ago    Immunization History  Administered Date(s) Administered   Influenza Whole 06/05/2007, 04/30/2008, 03/31/2009   Influenza, High Dose Seasonal PF 06/19/2014, 07/30/2015, 03/03/2017   Influenza,inj,quad, With Preservative 04/04/2017   Moderna Sars-Covid-2 Vaccination 07/03/2020   PFIZER(Purple Top)SARS-COV-2 Vaccination 08/13/2019, 09/11/2019   Pneumococcal Conjugate-13 06/19/2014, 07/30/2015   Pneumococcal Polysaccharide-23 12/11/2003, 03/03/2017   Td 03/31/2009   Tdap 10/19/2019, 03/24/2021   Zoster Recombinat (Shingrix) 07/17/2020, 09/15/2020    Past Medical History:  Diagnosis Date   Acute meniscal tear of knee left   Arthritis    generalized   AV block 08/01/2018   Benign essential hypertension 01/18/2007   Bronchiectasis with acute exacerbation 06/23/2020   Chest pain, atypical 08/23/2014   Chronic cough 10/07/2014   Coronary artery disease    Degeneration of lumbar intervertebral disc 10/14/2017   Diverticulosis of colon 07/07/2005  Elevated PSA 06/19/2014   GERD (gastroesophageal reflux disease) 01/18/2007   Gout, unspecified  01/18/2007   Hemorrhoids 01/19/2011   Hiatal hernia    History of colonic polyps 01/18/2007   Insomnia, unspecified 08/21/2007   Iron deficiency anemia, unspecified  01/19/2011   Lumbar post-laminectomy syndrome 10/14/2017   Lumbar spondylolysis    Lumbar strain 12/14/2011   Obstructive sleep apnea 01/18/2007   uses CPAP nightly   Paraesophageal hernia    large   Primary osteoarthritis of knee 05/23/2015   Prostate cancer ([place]) 2016   S/P knee replacement 05/23/2015   Sensorineural hearing loss, bilateral    Vitamin B12 deficiency    Vitamin D deficiency 10/28/2009    Tobacco History: Social History   Tobacco Use  Smoking Status Former   Packs/day: 2.00   Years: 20.00   Total pack years: 40.00   Types: Cigarettes   Quit date: 07/05/1974   Years since quitting: 47.8  Smokeless Tobacco Never   Counseling given: Not Answered   Outpatient Medications Prior to Visit  Medication Sig Dispense Refill   allopurinol (ZYLOPRIM) 300 MG tablet TAKE 1 TABLET BY MOUTH EVERY DAY 90 tablet 0   amLODipine (NORVASC) 10 MG tablet TAKE 1 TABLET BY MOUTH EVERY DAY 90 tablet 0   aspirin EC 81 MG tablet Take 1 tablet (81 mg total) by mouth daily. Swallow whole. 90 tablet 3   Calcium Carb-Cholecalciferol (CALCIUM 600 + D PO) Take 1 tablet by mouth daily. 800 units of vitamin D     docusate sodium (COLACE) 100 MG capsule Take 100 mg by mouth daily.      eszopiclone 3 MG TABS Take 1 tablet (3 mg total) by mouth at bedtime. Take immediately before bedtime PRN 30 tablet 2   Multiple Vitamin (MULTIVITAMIN) tablet Take 1 tablet by mouth daily.     omeprazole (PRILOSEC) 40 MG capsule TAKE 1 CAPSULE (40 MG TOTAL) BY MOUTH DAILY. 90 capsule 0   potassium gluconate 595 (99 K) MG TABS tablet Take 595 mg by mouth daily.     Respiratory Therapy Supplies (FLUTTER) DEVI 1 each by Does not apply route in the morning and at bedtime. 1 each 0   sodium chloride HYPERTONIC 3 % nebulizer solution Take by  nebulization as needed for other. 750 mL 1   sodium chloride HYPERTONIC 3 % nebulizer solution Take by nebulization as needed for other. 750 mL 12   tamsulosin (FLOMAX) 0.4 MG CAPS capsule Take 0.4 mg by mouth daily.     valsartan-hydrochlorothiazide (DIOVAN-HCT) 320-25 MG tablet TAKE 1 TABLET BY MOUTH DAILY. **DUE FOR YEARLY PHYSICAL** 90 tablet 0   vitamin B-12 (CYANOCOBALAMIN) 1000 MCG tablet Take 1,000 mcg by mouth daily.      rosuvastatin (CRESTOR) 20 MG tablet Take 1 tablet (20 mg total) by mouth daily. 90 tablet 3   No facility-administered medications prior to visit.    Review of Systems  Review of Systems  Constitutional: Negative.   HENT:  Positive for congestion and postnasal drip.   Respiratory:  Positive for cough. Negative for chest tightness, shortness of breath and wheezing.      Physical Exam  BP 128/70 (BP Location: Right Arm, Patient Position: Sitting, Cuff Size: Large)   Pulse (!) 59   Temp 98.5 F (36.9 C) (Oral)   Ht _0  (1.727 m)   Wt 240 lb (108.9 kg)   SpO2 97%   BMI 36.49 kg/m  Physical Exam Constitutional:      Appearance:  Normal appearance.  HENT:     Head: Normocephalic and atraumatic.     Right Ear: Tympanic membrane and external ear normal. There is no impacted cerumen.     Left Ear: Tympanic membrane and external ear normal. There is no impacted cerumen.     Mouth/Throat:     Mouth: Mucous membranes are moist.     Pharynx: Oropharynx is clear.  Cardiovascular:     Rate and Rhythm: Normal rate and regular rhythm.  Pulmonary:     Effort: Pulmonary effort is normal.     Breath sounds: Normal breath sounds. No wheezing, rhonchi or rales.  Musculoskeletal:        General: Normal range of motion.     Cervical back: Normal range of motion and neck supple.  Skin:    General: Skin is warm and dry.  Neurological:     General: No focal deficit present.     Mental Status: He is alert and oriented to person, place, and time. Mental status is at  baseline.  Psychiatric:        Mood and Affect: Mood normal.        Behavior: Behavior normal.        Thought Content: Thought content normal.        Judgment: Judgment normal.      Lab Results:  CBC    Component Value Date/Time   WBC 5.5 11/04/2021 1331   RBC 4.43 11/04/2021 1331   HGB 13.4 11/04/2021 1331   HCT 40.2 11/04/2021 1331   PLT 197.0 11/04/2021 1331   MCV 90.7 11/04/2021 1331   MCH 29.4 03/05/2020 1431   MCHC 33.4 11/04/2021 1331   RDW 15.6 (H) 11/04/2021 1331   LYMPHSABS 1.3 11/04/2021 1331   MONOABS 0.4 11/04/2021 1331   EOSABS 0.1 11/04/2021 1331   BASOSABS 0.0 11/04/2021 1331    BMET    Component Value Date/Time   NA 141 11/04/2021 1331   K 4.2 11/04/2021 1331   CL 104 11/04/2021 1331   CO2 30 11/04/2021 1331   GLUCOSE 102 (H) 11/04/2021 1331   BUN 23 11/04/2021 1331   CREATININE 1.12 11/04/2021 1331   CREATININE 1.24 01/01/2011 1609   CALCIUM 9.2 11/04/2021 1331   GFRNONAA >60 02/07/2019 1446   GFRAA >60 02/07/2019 1446    BNP No results found for: "BNP"  ProBNP    Component Value Date/Time   PROBNP 5.3 04/20/2010 2247    Imaging: No results found.   Assessment & Plan:   Bronchiectasis without complication ([place]) - Patient has had a daily productive cough for the last 6 months. Not acutely exacerbated. He has mild bronchiectasis on CT imaging to bilateral lower lobes.  Chest x-ray in September showed mild eventrtion of both diaphragms with overlying vascular congestion/atelectasis. He is not currently taking expectorants or using flutter valve to mobilize secretions.  He was able to provide [place] with a sputum sample today for respiratory culture and AFB.  Advised he start Mucinex 600 mg twice daily, use flutter valve 3 times daily and start hypertonic saline 1-2 times daily to loosen congestion.  If no improvement with above planned recommend getting CT chest imaging to assess for progression disease. FU with Dr. Elsworth Soho in 3 months or sooner if  needed.   PND (post-nasal drip) - Patient has nasal congestion/PND symptoms which are likely contributing to chronic cough - Start Atrovent nasal spray q12 hours     Martyn Ehrich, NP 05/03/2022

## 2022-05-03 NOTE — Patient Instructions (Addendum)
Recommendations: Start mucinex '600mg'$  morning and evening (RX sent) Use flutter valve three times a day to loose congestion  Start hypertonic saline nebulizer 1-2 times a day to loosen congestion Start Atrovent nasal spray (RX sent) If you can give one more sputum samples, can take several days to weeks for final results   Orders: Nebulizer machine  Follow-up: 3 months with Dr. Elsworth Soho or sooner if needed   Bronchiectasis  Bronchiectasis is a condition in which the airways in the lungs (bronchi) are damaged and widened. The condition makes it hard for the lungs to get rid of mucus, and it causes mucus to gather in the bronchi. This condition often leads to lung infections, which can make the condition worse. What are the causes? You can be born with this condition, or you can develop it later in life. Common causes of this condition include: Cystic fibrosis. Repeated lung infections, such as pneumonia or tuberculosis. An object or other blockage in the lungs. Breathing in fluid, food, or other objects (aspiration). A problem with the body's defense system (immune system) and lung structure that is present at birth (congenital). Sometimes the cause is not known. What are the signs or symptoms? Common symptoms of this condition include: A daily cough that brings up mucus and lasts for more than 3 weeks. Lung infections that happen often. Shortness of breath and wheezing. Weakness and feeling tired (fatigue). How is this diagnosed? This condition is diagnosed with tests, such as: Chest X-rays or CT scans. These are done to check for changes in the lungs. Breathing tests. These are done to check how well your lungs are working. A test of a sample of your saliva (sputum culture). This test is done to check for infection. Blood tests and other tests. These are done to check for related diseases or causes. How is this treated? Treatment for this condition depends on the severity of the  illness and its cause. Treatment may include: Medicines that loosen mucus so it can be coughed up (mucolytics). Medicines that relax the muscles of the bronchi (bronchodilators). Antibiotic medicines to prevent or treat infection. Physical therapy to help clear mucus from the lungs. Techniques may include: Postural drainage. This is when you sit or lie in certain positions so that mucus can drain by gravity. Chest percussion. This involves tapping the chest or back with a cupped hand. Chest vibration. For this therapy, a hand or special equipment vibrates your chest and back. Surgery to remove the affected part of the lung. This may be done in severe cases. Follow these instructions at home: Medicines Take over-the-counter and prescription medicines only as told by your health care provider. If you were prescribed an antibiotic medicine, take it as told by your health care provider. Do not stop taking the antibiotic even if you start to feel better. Avoid taking sedatives and antihistamines unless your health care provider tells you to take them. These medicines tend to thicken the mucus in the lungs. Managing symptoms Do breathing exercises or techniques to clear your lungs as told by your health care provider. Consider using a cold steam vaporizer or humidifier in your room or home to help loosen secretions. If you have a cough that gets worse at night, try sleeping in a semi-upright position. General instructions Get plenty of rest. Drink enough fluid to keep your urine pale yellow. Stay inside when pollution and ozone levels are high. Stay up to date with vaccinations and immunizations. Avoid cigarette smoke and other lung  irritants. Do not use any products that contain nicotine or tobacco. These products include cigarettes, chewing tobacco, and vaping devices, such as e-cigarettes. If you need help quitting, ask your health care provider. Keep all follow-up visits. This is  important. Contact a health care provider if: You cough up more sputum than before and the sputum is yellow or green in color. You have a fever or chills. You cannot control your cough and are losing sleep. Get help right away if: You cough up blood. You have chest pain. You have increasing shortness of breath. You have pain that gets worse or is not controlled with medicines. You have a fever and your symptoms suddenly get worse. These symptoms may be an emergency. Get help right away. Call 911. Do not wait to see if the symptoms will go away. Do not drive yourself to the hospital. Summary Bronchiectasis is a condition in which the airways in the lungs (bronchi) are damaged and widened. The condition makes it hard for the lungs to get rid of mucus, and it causes mucus to gather in the bronchi. Treatment usually includes therapy to help clear mucus from the lungs. Avoid cigarette smoke and other lung irritants. Stay up to date with vaccinations and immunizations. This information is not intended to replace advice given to you by your health care provider. Make sure you discuss any questions you have with your health care provider. Document Revised: 03/04/2021 Document Reviewed: 01/20/2021 Elsevier Patient Education  [place].

## 2022-05-03 NOTE — Assessment & Plan Note (Addendum)
-   Patient has nasal congestion/PND symptoms which are likely contributing to chronic cough - Start Atrovent nasal spray q12 hours

## 2022-05-04 ENCOUNTER — Other Ambulatory Visit: Payer: Medicare Other

## 2022-05-04 DIAGNOSIS — J479 Bronchiectasis, uncomplicated: Secondary | ICD-10-CM

## 2022-05-05 NOTE — Telephone Encounter (Signed)
Patient was seen by [facility] on 10/30. Results discussed at office visit.

## 2022-05-07 LAB — RESPIRATORY CULTURE OR RESPIRATORY AND SPUTUM CULTURE
MICRO NUMBER:: 14124495
RESULT:: NORMAL
SPECIMEN QUALITY:: ADEQUATE

## 2022-05-07 NOTE — Progress Notes (Signed)
Please let patient know sputum culture showed normal growth of oropharyngeal flora

## 2022-05-17 ENCOUNTER — Ambulatory Visit (INDEPENDENT_AMBULATORY_CARE_PROVIDER_SITE_OTHER): Payer: Medicare Other

## 2022-05-17 DIAGNOSIS — I4430 Unspecified atrioventricular block: Secondary | ICD-10-CM

## 2022-05-18 LAB — CUP PACEART REMOTE DEVICE CHECK
Date Time Interrogation Session: 20231113142022
Implantable Lead Connection Status: 753985
Implantable Lead Connection Status: 753985
Implantable Lead Implant Date: 20200129
Implantable Lead Implant Date: 20200129
Implantable Lead Location: 753859
Implantable Lead Location: 753860
Implantable Lead Model: 377
Implantable Lead Model: 377
Implantable Lead Serial Number: 80947537
Implantable Lead Serial Number: 80970966
Implantable Pulse Generator Implant Date: 20200129
Pulse Gen Model: 407145
Pulse Gen Serial Number: 69503797

## 2022-05-25 ENCOUNTER — Ambulatory Visit (INDEPENDENT_AMBULATORY_CARE_PROVIDER_SITE_OTHER): Payer: Medicare Other | Admitting: Adult Health

## 2022-05-25 VITALS — BP 180/100 | HR 62 | Temp 97.8°F | Ht 68.0 in | Wt 243.0 lb

## 2022-05-25 DIAGNOSIS — I10 Essential (primary) hypertension: Secondary | ICD-10-CM

## 2022-05-25 DIAGNOSIS — R2689 Other abnormalities of gait and mobility: Secondary | ICD-10-CM | POA: Diagnosis not present

## 2022-05-25 DIAGNOSIS — Z79899 Other long term (current) drug therapy: Secondary | ICD-10-CM | POA: Diagnosis not present

## 2022-05-25 MED ORDER — AMLODIPINE BESYLATE 10 MG PO TABS: 10.0000 mg | ORAL_TABLET | Freq: Every day | ORAL | 3 refills | Status: DC

## 2022-05-25 MED ORDER — VALSARTAN-HYDROCHLOROTHIAZIDE 320-25 MG PO TABS: ORAL_TABLET | ORAL | 3 refills | Status: DC

## 2022-05-25 MED ORDER — ALLOPURINOL 300 MG PO TABS
300.0000 mg | ORAL_TABLET | Freq: Every day | ORAL | 3 refills | Status: DC
  Filled 2023-04-22: qty 90, 90d supply, fill #0

## 2022-05-25 NOTE — Patient Instructions (Signed)
I have sent in your medications that you are do for.   I want you to take eveything that is on your medication list.   Throw away other medications that you have at home

## 2022-05-25 NOTE — Progress Notes (Signed)
Subjective:    Patient ID: Marcus [name], male    DOB: Sep 01, 1940, 81 y.o.   MRN: [id number]  HPI  He presents to the office today for medication management. He reports that he has not taken any of his medications in two weeks because his medications got screwed up and he has a lot of pill bottles at home that were temporary medications and he is not exactly sure what he should be taking   He would also like a referral to podiatry to discuss getting a shoe lift as one leg is shorter than the other   Review of Systems See HPI   Past Medical History:  Diagnosis Date   Acute meniscal tear of knee left   Arthritis    generalized   AV block 08/01/2018   Benign essential hypertension 01/18/2007   Bronchiectasis with acute exacerbation 06/23/2020   Chest pain, atypical 08/23/2014   Chronic cough 10/07/2014   Coronary artery disease    Degeneration of lumbar intervertebral disc 10/14/2017   Diverticulosis of colon 07/07/2005   Elevated PSA 06/19/2014   GERD (gastroesophageal reflux disease) 01/18/2007   Gout, unspecified 01/18/2007   Hemorrhoids 01/19/2011   Hiatal hernia    History of colonic polyps 01/18/2007   Insomnia, unspecified 08/21/2007   Iron deficiency anemia, unspecified  01/19/2011   Lumbar post-laminectomy syndrome 10/14/2017   Lumbar spondylolysis    Lumbar strain 12/14/2011   Obstructive sleep apnea 01/18/2007   uses CPAP nightly   Paraesophageal hernia    large   Primary osteoarthritis of knee 05/23/2015   Prostate cancer ([place]) 2016   S/P knee replacement 05/23/2015   Sensorineural hearing loss, bilateral    Vitamin B12 deficiency    Vitamin D deficiency 10/28/2009    Social History   Socioeconomic History   Marital status: Widowed    Spouse name: Not on file   Number of children: 4   Years of education: 6   Highest education level: Some college, no degree  Occupational History   Occupation: retired    Fish farm manager: [employer]  Tobacco Use    Smoking status: Former    Packs/day: 2.00    Years: 20.00    Total pack years: 40.00    Types: Cigarettes    Quit date: 07/05/1974    Years since quitting: 47.9   Smokeless tobacco: Never  Vaping Use   Vaping Use: Never used  Substance and Sexual Activity   Alcohol use: Not Currently    Alcohol/week: 30.0 standard drinks of alcohol    Types: 30 Standard drinks or equivalent per week    Comment: 4-5 glasses of wine or 2-3 beers nightly   Drug use: No   Sexual activity: Not Currently  Other Topics Concern   Not on file  Social History Narrative   Recently widowed   Former Smoker    Alcohol use-yes 1-2 drinks per day      Occupation: Retired Midwife      Originally from [place], [place] - in [place] > 10 yrs as of 2017      Social Determinants of Health   Financial Resource Strain: [place]  (07/28/2021)   Overall Financial Resource Strain (CARDIA)    Difficulty of Paying Living Expenses: Not hard at all  Food Insecurity: No Food Insecurity (07/28/2021)   Hunger Vital Sign    Worried About Running Out of Food in the Last Year: Never true    [place] in the  Last Year: Never true  Transportation Needs: No Transportation Needs (07/28/2021)   PRAPARE - Hydrologist (Medical): No    Lack of Transportation (Non-Medical): No  Physical Activity: Inactive (07/28/2021)   Exercise Vital Sign    Days of Exercise per Week: 0 days    Minutes of Exercise per Session: 0 min  Stress: No Stress Concern Present (07/28/2021)   [place]    Feeling of Stress : Not at all  Social Connections: Moderately Isolated (08/04/2020)   Social Connection and Isolation Panel [NHANES]    Frequency of Communication with Friends and Family: Three times a week    Frequency of Social Gatherings with Friends and Family: More than three times a week    Attends Religious Services: More than 4 times per year    Active  Member of Clubs or Organizations: No    Attends Archivist Meetings: Never    Marital Status: Widowed  Intimate Partner Violence: Not At Risk (08/04/2020)   Humiliation, Afraid, Rape, and Kick questionnaire    Fear of Current or Ex-Partner: No    Emotionally Abused: No    Physically Abused: No    Sexually Abused: No    Past Surgical History:  Procedure Laterality Date   BRONCHIAL WASHINGS  10/30/2019   Procedure: BRONCHIAL WASHINGS;  Surgeon: Julian Hy, DO;  Location: [facility];  Service: Endoscopy;;   CARDIAC CATHETERIZATION     CATARACT EXTRACTION W/ INTRAOCULAR LENS  IMPLANT, BILATERAL Bilateral    COLONOSCOPY     ESOPHAGOGASTRODUODENOSCOPY     KNEE ARTHROSCOPY Right 2007   KNEE ARTHROSCOPY  07/13/2011   Procedure: ARTHROSCOPY KNEE;  Surgeon: Cynda Familia;  Location: [facility];  Service: Orthopedics;  Laterality: Left;  partial menisectomy with chondrylplasty   KNEE SURGERY Left    LAMINECTOMY AND MICRODISCECTOMY LUMBAR SPINE  MARCH  2008   L3 -  4   LAPAROSCOPIC INCISIONAL / UMBILICAL / VENTRAL HERNIA REPAIR  2006   PACEMAKER IMPLANT N/A 08/02/2018   Procedure: PACEMAKER IMPLANT;  Surgeon: Evans Lance, MD;  Location: [facility];  Service: Cardiovascular;  Laterality: N/A;   PROSTATE BIOPSY     RADIOACTIVE SEED IMPLANT N/A 02/12/2019   Procedure: RADIOACTIVE SEED IMPLANT/BRACHYTHERAPY IMPLANT;  Surgeon: Franchot Gallo, MD;  [facility]: [facility];  Service: Urology;  Laterality: N/A;  90 MINS   SHOULDER ARTHROSCOPY Right 05-23-2007   SIGMOID COLECTOMY FOR CANCER  1989   SPACE OAR INSTILLATION N/A 02/12/2019   Procedure: SPACE OAR INSTILLATION;  Surgeon: Franchot Gallo, MD;  [facility];  Service: Urology;  Laterality: N/A;   TOTAL KNEE ARTHROPLASTY Left 05/23/2015   Procedure: LEFT TOTAL KNEE ARTHROPLASTY;  Surgeon: Sydnee Cabal, MD;  [facility];  Service: Orthopedics;  Laterality: Left;   VERTICAL BANDED  GASTROPLASTY  1986   VIDEO BRONCHOSCOPY N/A 10/30/2019   Procedure: VIDEO BRONCHOSCOPY WITH BRONICAL ALVEROLAR LAVAGE WITHOUT FLUORO;  Surgeon: Julian Hy, DO;  [facility];  Service: Endoscopy;  Laterality: N/A;    Family History  Problem Relation Age of Onset   Stroke Paternal Grandfather    Heart disease Paternal Grandfather    Esophagitis Father        died from perforated esophagus   Breast cancer Mother    Colon cancer Maternal Grandmother        questionable   Esophageal cancer Neg Hx    Prostate  cancer Neg Hx    Stomach cancer Neg Hx     Allergies  Allergen Reactions   Contrast Media [Iodinated Contrast Media] Palpitations    TACHYCARDIA- patient states he has tolerated newer agents since this reaction >30 yrs ago    Current Outpatient Medications on File Prior to Visit  Medication Sig Dispense Refill   aspirin EC 81 MG tablet Take 1 tablet (81 mg total) by mouth daily. Swallow whole. 90 tablet 3   Calcium Carb-Cholecalciferol (CALCIUM 600 + D PO) Take 1 tablet by mouth daily. 800 units of vitamin D     ipratropium (ATROVENT) 0.03 % nasal spray Place 2 sprays into both nostrils every 12 (twelve) hours. 30 mL 1   Multiple Vitamin (MULTIVITAMIN) tablet Take 1 tablet by mouth daily.     Respiratory Therapy Supplies (FLUTTER) DEVI 1 each by Does not apply route in the morning and at bedtime. 1 each 0   sodium chloride HYPERTONIC 3 % nebulizer solution Take by nebulization as needed for other. 750 mL 12   tamsulosin (FLOMAX) 0.4 MG CAPS capsule Take 0.4 mg by mouth daily.     rosuvastatin (CRESTOR) 20 MG tablet Take 1 tablet (20 mg total) by mouth daily. 90 tablet 3   No current facility-administered medications on file prior to visit.    BP (!) 180/100   Pulse 62   Temp 97.8 F (36.6 C) (Oral)   Ht '5\' 8"'$  (1.727 m)   Wt 243 lb (110.2 kg)   SpO2 97%   BMI 36.95 kg/m       Objective:   Physical Exam Vitals and nursing note reviewed.   Constitutional:      Appearance: Normal appearance.  Cardiovascular:     Rate and Rhythm: Normal rate and regular rhythm.     Pulses: Normal pulses.     Heart sounds: Normal heart sounds.  Pulmonary:     Effort: Pulmonary effort is normal.     Breath sounds: Normal breath sounds.  Musculoskeletal:        General: Normal range of motion.  Skin:    General: Skin is warm and dry.     Capillary Refill: Capillary refill takes less than 2 seconds.  Neurological:     General: No focal deficit present.     Mental Status: He is alert.     Gait: Gait abnormal.  Psychiatric:        Mood and Affect: Mood normal.        Behavior: Behavior normal.        Thought Content: Thought content normal.        Judgment: Judgment normal.       Assessment & Plan:  1. Medication management - We reviewed all of his medications in detail. He is no longer taking Lunesta as he is sleeping much better with his CPAP machine.  - Advised to take his new medication list home and go through all of his medications. Discard all medications that are not on the list   2. Essential hypertension - elevated today as he has not had his medications in multiple weeks.  - valsartan-hydrochlorothiazide (DIOVAN-HCT) 320-25 MG tablet; TAKE 1 TABLET BY MOUTH DAILY.  Dispense: 90 tablet; Refill: 3  3. Short-leg limp  - Ambulatory referral to [facility], NP

## 2022-06-01 DIAGNOSIS — J479 Bronchiectasis, uncomplicated: Secondary | ICD-10-CM | POA: Diagnosis not present

## 2022-06-08 ENCOUNTER — Ambulatory Visit: Payer: Medicare Other | Admitting: Podiatry

## 2022-06-22 ENCOUNTER — Encounter: Payer: Self-pay | Admitting: Adult Health

## 2022-06-22 ENCOUNTER — Ambulatory Visit (INDEPENDENT_AMBULATORY_CARE_PROVIDER_SITE_OTHER): Payer: Medicare Other

## 2022-06-22 ENCOUNTER — Ambulatory Visit (INDEPENDENT_AMBULATORY_CARE_PROVIDER_SITE_OTHER): Payer: Medicare Other | Admitting: Adult Health

## 2022-06-22 VITALS — BP 110/60 | HR 67 | Temp 98.2°F | Ht 68.0 in | Wt 240.0 lb

## 2022-06-22 DIAGNOSIS — R599 Enlarged lymph nodes, unspecified: Secondary | ICD-10-CM

## 2022-06-22 DIAGNOSIS — K5900 Constipation, unspecified: Secondary | ICD-10-CM

## 2022-06-22 NOTE — Progress Notes (Signed)
Subjective:    Patient ID: KARTIK FERNANDO, male    DOB: 07-Oct-1940, 81 y.o.   MRN: [id number]  HPI 81 year old male who  has a past medical history of Acute meniscal tear of knee (left), Arthritis, AV block (08/01/2018), Benign essential hypertension (01/18/2007), Bronchiectasis with acute exacerbation (06/23/2020), Chest pain, atypical (08/23/2014), Chronic cough (10/07/2014), Coronary artery disease, Degeneration of lumbar intervertebral disc (10/14/2017), Diverticulosis of colon (07/07/2005), Elevated PSA (06/19/2014), GERD (gastroesophageal reflux disease) (01/18/2007), Gout, unspecified (01/18/2007), Hemorrhoids (01/19/2011), Hiatal hernia, History of colonic polyps (01/18/2007), Insomnia, unspecified (08/21/2007), Iron deficiency anemia, unspecified  (01/19/2011), Lumbar post-laminectomy syndrome (10/14/2017), Lumbar spondylolysis, Lumbar strain (12/14/2011), Obstructive sleep apnea (01/18/2007), Paraesophageal hernia, Primary osteoarthritis of knee (05/23/2015), Prostate cancer ([place]) (2016), S/P knee replacement (05/23/2015), Sensorineural hearing loss, bilateral, Vitamin B12 deficiency, and Vitamin D deficiency (10/28/2009).  He presents to the office today for multiple issues.  His first concern is that of concern of some enlarged lymph nodes under his chin.  Reports that have been present for multiple months.  Not always tender to touch but sometimes it can be.  He denies any acute illness like symptoms.  Constipation-Long term issue.  He does have a history of [place] colectomy in 1989.  He reports that he has been constipated without a bowel movement for the last 3 days.  Has been using over-the-counter stool softeners without improvement.  He does feel bloated but is passing gas.   Review of Systems See HPI   Past Medical History:  Diagnosis Date   Acute meniscal tear of knee left   Arthritis    generalized   AV block 08/01/2018   Benign essential hypertension 01/18/2007    Bronchiectasis with acute exacerbation 06/23/2020   Chest pain, atypical 08/23/2014   Chronic cough 10/07/2014   Coronary artery disease    Degeneration of lumbar intervertebral disc 10/14/2017   Diverticulosis of colon 07/07/2005   Elevated PSA 06/19/2014   GERD (gastroesophageal reflux disease) 01/18/2007   Gout, unspecified 01/18/2007   Hemorrhoids 01/19/2011   Hiatal hernia    History of colonic polyps 01/18/2007   Insomnia, unspecified 08/21/2007   Iron deficiency anemia, unspecified  01/19/2011   Lumbar post-laminectomy syndrome 10/14/2017   Lumbar spondylolysis    Lumbar strain 12/14/2011   Obstructive sleep apnea 01/18/2007   uses CPAP nightly   Paraesophageal hernia    large   Primary osteoarthritis of knee 05/23/2015   Prostate cancer ([place]) 2016   S/P knee replacement 05/23/2015   Sensorineural hearing loss, bilateral    Vitamin B12 deficiency    Vitamin D deficiency 10/28/2009    Social History   Socioeconomic History   Marital status: Widowed    Spouse name: Not on file   Number of children: 4   Years of education: 75   Highest education level: Some college, no degree  Occupational History   Occupation: retired    Fish farm manager: [employer]  Tobacco Use   Smoking status: Former    Packs/day: 2.00    Years: 20.00    Total pack years: 40.00    Types: Cigarettes    Quit date: 07/05/1974    Years since quitting: 47.9   Smokeless tobacco: Never  Vaping Use   Vaping Use: Never used  Substance and Sexual Activity   Alcohol use: Not Currently    Alcohol/week: 30.0 standard drinks of alcohol    Types: 30 Standard drinks or equivalent per week    Comment: 4-5 glasses of wine  or 2-3 beers nightly   Drug use: No   Sexual activity: Not Currently  Other Topics Concern   Not on file  Social History Narrative   Recently widowed   Former Smoker    Alcohol use-yes 1-2 drinks per day      Occupation: Retired Midwife      Originally from [place], [place] - in [place]  > 10 yrs as of 2017      Social Determinants of Health   Financial Resource Strain: [place]  (07/28/2021)   Overall Financial Resource Strain (CARDIA)    Difficulty of Paying Living Expenses: Not hard at all  Food Insecurity: No Food Insecurity (07/28/2021)   Hunger Vital Sign    Worried About Running Out of Food in the Last Year: Never true    [place] in the Last Year: Never true  Transportation Needs: No Transportation Needs (07/28/2021)   PRAPARE - Hydrologist (Medical): No    Lack of Transportation (Non-Medical): No  Physical Activity: Inactive (07/28/2021)   Exercise Vital Sign    Days of Exercise per Week: 0 days    Minutes of Exercise per Session: 0 min  Stress: No Stress Concern Present (07/28/2021)   [place]    Feeling of Stress : Not at all  Social Connections: Moderately Isolated (08/04/2020)   Social Connection and Isolation Panel [NHANES]    Frequency of Communication with Friends and Family: Three times a week    Frequency of Social Gatherings with Friends and Family: More than three times a week    Attends Religious Services: More than 4 times per year    Active Member of Clubs or Organizations: No    Attends Archivist Meetings: Never    Marital Status: Widowed  Intimate Partner Violence: Not At Risk (08/04/2020)   Humiliation, Afraid, Rape, and Kick questionnaire    Fear of Current or Ex-Partner: No    Emotionally Abused: No    Physically Abused: No    Sexually Abused: No    Past Surgical History:  Procedure Laterality Date   BRONCHIAL WASHINGS  10/30/2019   Procedure: BRONCHIAL WASHINGS;  Surgeon: Julian Hy, DO;  [facility];  Service: Endoscopy;;   CARDIAC CATHETERIZATION     CATARACT EXTRACTION W/ INTRAOCULAR LENS  IMPLANT, BILATERAL Bilateral    COLONOSCOPY     ESOPHAGOGASTRODUODENOSCOPY     KNEE ARTHROSCOPY Right 2007   KNEE  ARTHROSCOPY  07/13/2011   Procedure: ARTHROSCOPY KNEE;  Surgeon: Cynda Familia;  Location: [facility];  Service: Orthopedics;  Laterality: Left;  partial menisectomy with chondrylplasty   KNEE SURGERY Left    LAMINECTOMY AND MICRODISCECTOMY LUMBAR SPINE  MARCH  2008   L3 -  4   LAPAROSCOPIC INCISIONAL / UMBILICAL / VENTRAL HERNIA REPAIR  2006   PACEMAKER IMPLANT N/A 08/02/2018   Procedure: PACEMAKER IMPLANT;  Surgeon: Evans Lance, MD;  Location: [facility];  Service: Cardiovascular;  Laterality: N/A;   PROSTATE BIOPSY     RADIOACTIVE SEED IMPLANT N/A 02/12/2019   Procedure: RADIOACTIVE SEED IMPLANT/BRACHYTHERAPY IMPLANT;  Surgeon: Franchot Gallo, MD;  [facility];  Service: Urology;  Laterality: N/A;  90 MINS   SHOULDER ARTHROSCOPY Right 05-23-2007   SIGMOID COLECTOMY FOR CANCER  1989   SPACE OAR INSTILLATION N/A 02/12/2019   Procedure: SPACE OAR INSTILLATION;  Surgeon: Franchot Gallo, MD;  Location: [facility];  Service: Urology;  Laterality: N/A;   TOTAL KNEE ARTHROPLASTY Left 05/23/2015   Procedure: LEFT TOTAL KNEE ARTHROPLASTY;  Surgeon: Sydnee Cabal, MD;  [facility];  Service: Orthopedics;  Laterality: Left;   VERTICAL BANDED GASTROPLASTY  1986   VIDEO BRONCHOSCOPY N/A 10/30/2019   Procedure: VIDEO BRONCHOSCOPY WITH BRONICAL ALVEROLAR LAVAGE WITHOUT FLUORO;  Surgeon: Julian Hy, DO;  [facility];  Service: Endoscopy;  Laterality: N/A;    Family History  Problem Relation Age of Onset   Stroke Paternal Grandfather    Heart disease Paternal Grandfather    Esophagitis Father        died from perforated esophagus   Breast cancer Mother    Colon cancer Maternal Grandmother        questionable   Esophageal cancer Neg Hx    Prostate cancer Neg Hx    Stomach cancer Neg Hx     Allergies  Allergen Reactions   Contrast Media [Iodinated Contrast Media] Palpitations    TACHYCARDIA- patient states he has tolerated newer agents  since this reaction >30 yrs ago    Current Outpatient Medications on File Prior to Visit  Medication Sig Dispense Refill   allopurinol (ZYLOPRIM) 300 MG tablet Take 1 tablet (300 mg total) by mouth daily. 90 tablet 3   amLODipine (NORVASC) 10 MG tablet Take 1 tablet (10 mg total) by mouth daily. 90 tablet 3   aspirin EC 81 MG tablet Take 1 tablet (81 mg total) by mouth daily. Swallow whole. 90 tablet 3   Calcium Carb-Cholecalciferol (CALCIUM 600 + D PO) Take 1 tablet by mouth daily. 800 units of vitamin D     ipratropium (ATROVENT) 0.03 % nasal spray Place 2 sprays into both nostrils every 12 (twelve) hours. 30 mL 1   Multiple Vitamin (MULTIVITAMIN) tablet Take 1 tablet by mouth daily.     Respiratory Therapy Supplies (FLUTTER) DEVI 1 each by Does not apply route in the morning and at bedtime. 1 each 0   rosuvastatin (CRESTOR) 20 MG tablet Take 1 tablet (20 mg total) by mouth daily. 90 tablet 3   sodium chloride HYPERTONIC 3 % nebulizer solution Take by nebulization as needed for other. 750 mL 12   tamsulosin (FLOMAX) 0.4 MG CAPS capsule Take 0.4 mg by mouth daily.     valsartan-hydrochlorothiazide (DIOVAN-HCT) 320-25 MG tablet TAKE 1 TABLET BY MOUTH DAILY. 90 tablet 3   No current facility-administered medications on file prior to visit.    BP 110/60   Pulse 67   Temp 98.2 F (36.8 C) (Oral)   Ht '5\' 8"'$  (1.727 m)   Wt 240 lb (108.9 kg)   SpO2 95%   BMI 36.49 kg/m       Objective:   Physical Exam Vitals and nursing note reviewed.  Constitutional:      Appearance: Normal appearance.  Cardiovascular:     Rate and Rhythm: Normal rate and regular rhythm.     Pulses: Normal pulses.     Heart sounds: Normal heart sounds.  Pulmonary:     Effort: Pulmonary effort is normal.     Breath sounds: Normal breath sounds.  Abdominal:     General: Abdomen is flat. Bowel sounds are normal. There is distension.     Palpations: Abdomen is soft. There is no mass.     Tenderness: There is no  abdominal tenderness. There is no guarding or rebound.     Hernia: No hernia is present.  Lymphadenopathy:  Head:     Right side of head: Submandibular and tonsillar adenopathy present.     Left side of head: Submandibular and tonsillar adenopathy present.     Comments: Enlarged but not swollen   Skin:    General: Skin is warm and dry.  Neurological:     General: No focal deficit present.     Mental Status: He is alert and oriented to person, place, and time.  Psychiatric:        Mood and Affect: Mood normal.        Behavior: Behavior normal.        Thought Content: Thought content normal.       Assessment & Plan:  1. Constipation, unspecified constipation type - advised to stop OTC stool softners. Start with warm prune juice and then incorporate metamucil into daily regimen  - Consider referral to GI   - DG Abd 1 View; Future  2. Enlarged lymph nodes  - US Soft Tissue Head/Neck (NON-THYROID); Future  Dorothyann Peng, NP

## 2022-06-22 NOTE — Patient Instructions (Signed)
Mix one cup warm prune juice 2 tables melted butter 2 tablespoons olive oil   I will follow up with you once I get the xray back   Someone will call you for your ultrasound

## 2022-06-24 ENCOUNTER — Telehealth: Payer: Self-pay | Admitting: Adult Health

## 2022-06-24 NOTE — Telephone Encounter (Signed)
Updated patient on [facility] results which show   IMPRESSION: No bowel obstruction. Mild to moderate amount of retained stool in the colon.  He was able to have a large BM and feels much better

## 2022-06-25 ENCOUNTER — Ambulatory Visit (HOSPITAL_COMMUNITY)
Admission: RE | Admit: 2022-06-25 | Discharge: 2022-06-25 | Disposition: A | Discharge status: Home / Self Care | Payer: Medicare Other | Source: Ambulatory Visit | Attending: Adult Health | Admitting: Adult Health

## 2022-06-25 DIAGNOSIS — R599 Enlarged lymph nodes, unspecified: Secondary | ICD-10-CM | POA: Diagnosis not present

## 2022-06-25 DIAGNOSIS — Z8709 Personal history of other diseases of the respiratory system: Secondary | ICD-10-CM | POA: Diagnosis not present

## 2022-06-29 NOTE — Progress Notes (Signed)
Remote pacemaker transmission.   

## 2022-07-01 ENCOUNTER — Telehealth: Payer: Self-pay | Admitting: Adult Health

## 2022-07-01 DIAGNOSIS — J479 Bronchiectasis, uncomplicated: Secondary | ICD-10-CM | POA: Diagnosis not present

## 2022-07-01 NOTE — Telephone Encounter (Signed)
Updated patient on results of [place] of lymph nodes

## 2022-07-05 LAB — AFB CULTURE WITH SMEAR (NOT AT ARMC)
Acid Fast Culture: NEGATIVE
Acid Fast Smear: NEGATIVE

## 2022-07-07 ENCOUNTER — Ambulatory Visit (INDEPENDENT_AMBULATORY_CARE_PROVIDER_SITE_OTHER): Payer: Medicare Other | Admitting: Adult Health

## 2022-07-07 ENCOUNTER — Encounter: Payer: Self-pay | Admitting: Adult Health

## 2022-07-07 VITALS — BP 122/82 | HR 60 | Temp 98.0°F | Ht 68.0 in | Wt 244.0 lb

## 2022-07-07 DIAGNOSIS — R0982 Postnasal drip: Secondary | ICD-10-CM

## 2022-07-07 DIAGNOSIS — Z85038 Personal history of other malignant neoplasm of large intestine: Secondary | ICD-10-CM

## 2022-07-07 DIAGNOSIS — R053 Chronic cough: Secondary | ICD-10-CM | POA: Diagnosis not present

## 2022-07-07 NOTE — Progress Notes (Signed)
Subjective:    Patient ID: Marcus [name], male    DOB: 11-22-1940, 82 y.o.   MRN: [id number]  HPI 82 year old male who  has a past medical history of Acute meniscal tear of knee (left), Arthritis, AV block (08/01/2018), Benign essential hypertension (01/18/2007), Bronchiectasis with acute exacerbation (06/23/2020), Chest pain, atypical (08/23/2014), Chronic cough (10/07/2014), Coronary artery disease, Degeneration of lumbar intervertebral disc (10/14/2017), Diverticulosis of colon (07/07/2005), Elevated PSA (06/19/2014), GERD (gastroesophageal reflux disease) (01/18/2007), Gout, unspecified (01/18/2007), Hemorrhoids (01/19/2011), Hiatal hernia, History of colonic polyps (01/18/2007), Insomnia, unspecified (08/21/2007), Iron deficiency anemia, unspecified  (01/19/2011), Lumbar post-laminectomy syndrome (10/14/2017), Lumbar spondylolysis, Lumbar strain (12/14/2011), Obstructive sleep apnea (01/18/2007), Paraesophageal hernia, Primary osteoarthritis of knee (05/23/2015), Prostate cancer ([place]) (2016), S/P knee replacement (05/23/2015), Sensorineural hearing loss, bilateral, Vitamin B12 deficiency, and Vitamin D deficiency (10/28/2009).  He presents to the office today. He needs to have some paperwork filled out of the [place] and medical records transferred to see if he would qualify for VA benefits due to history of prostate cancer, chronic rhinorrhea, and chronic cough due to atelectasis.   He served in [employer] from [phone] and was around a lot of jet fuel    He is not sure how to go about doing this   Review of Systems See HPI   Past Medical History:  Diagnosis Date   Acute meniscal tear of knee left   Arthritis    generalized   AV block 08/01/2018   Benign essential hypertension 01/18/2007   Bronchiectasis with acute exacerbation 06/23/2020   Chest pain, atypical 08/23/2014   Chronic cough 10/07/2014   Coronary artery disease    Degeneration of lumbar intervertebral disc 10/14/2017    Diverticulosis of colon 07/07/2005   Elevated PSA 06/19/2014   GERD (gastroesophageal reflux disease) 01/18/2007   Gout, unspecified 01/18/2007   Hemorrhoids 01/19/2011   Hiatal hernia    History of colonic polyps 01/18/2007   Insomnia, unspecified 08/21/2007   Iron deficiency anemia, unspecified  01/19/2011   Lumbar post-laminectomy syndrome 10/14/2017   Lumbar spondylolysis    Lumbar strain 12/14/2011   Obstructive sleep apnea 01/18/2007   uses CPAP nightly   Paraesophageal hernia    large   Primary osteoarthritis of knee 05/23/2015   Prostate cancer ([place]) 2016   S/P knee replacement 05/23/2015   Sensorineural hearing loss, bilateral    Vitamin B12 deficiency    Vitamin D deficiency 10/28/2009    Social History   Socioeconomic History   Marital status: Widowed    Spouse name: Not on file   Number of children: 4   Years of education: 56   Highest education level: Some college, no degree  Occupational History   Occupation: retired    Fish farm manager: [employer]  Tobacco Use   Smoking status: Former    Packs/day: 2.00    Years: 20.00    Total pack years: 40.00    Types: Cigarettes    Quit date: 07/05/1974    Years since quitting: 48.0   Smokeless tobacco: Never  Vaping Use   Vaping Use: Never used  Substance and Sexual Activity   Alcohol use: Not Currently    Alcohol/week: 30.0 standard drinks of alcohol    Types: 30 Standard drinks or equivalent per week    Comment: 4-5 glasses of wine or 2-3 beers nightly   Drug use: No   Sexual activity: Not Currently  Other Topics Concern   Not on file  Social History Narrative  Recently widowed   Former Smoker    Alcohol use-yes 1-2 drinks per day      Occupation: Retired Midwife      Originally from [place], [place] - in [place] > 10 yrs as of 2017      Social Determinants of Health   Financial Resource Strain: [place]  (07/28/2021)   Overall Financial Resource Strain (CARDIA)    Difficulty of Paying Living Expenses:  Not hard at all  Food Insecurity: No Food Insecurity (07/28/2021)   Hunger Vital Sign    Worried About Running Out of Food in the Last Year: Never true    [place] in the Last Year: Never true  Transportation Needs: No Transportation Needs (07/28/2021)   PRAPARE - Hydrologist (Medical): No    Lack of Transportation (Non-Medical): No  Physical Activity: Inactive (07/28/2021)   Exercise Vital Sign    Days of Exercise per Week: 0 days    Minutes of Exercise per Session: 0 min  Stress: No Stress Concern Present (07/28/2021)   [place]    Feeling of Stress : Not at all  Social Connections: Moderately Isolated (08/04/2020)   Social Connection and Isolation Panel [NHANES]    Frequency of Communication with Friends and Family: Three times a week    Frequency of Social Gatherings with Friends and Family: More than three times a week    Attends Religious Services: More than 4 times per year    Active Member of Clubs or Organizations: No    Attends Archivist Meetings: Never    Marital Status: Widowed  Intimate Partner Violence: Not At Risk (08/04/2020)   Humiliation, Afraid, Rape, and Kick questionnaire    Fear of Current or Ex-Partner: No    Emotionally Abused: No    Physically Abused: No    Sexually Abused: No    Past Surgical History:  Procedure Laterality Date   BRONCHIAL WASHINGS  10/30/2019   Procedure: BRONCHIAL WASHINGS;  Surgeon: Julian Hy, DO;  [facility];  Service: Endoscopy;;   CARDIAC CATHETERIZATION     CATARACT EXTRACTION W/ INTRAOCULAR LENS  IMPLANT, BILATERAL Bilateral    COLONOSCOPY     ESOPHAGOGASTRODUODENOSCOPY     KNEE ARTHROSCOPY Right 2007   KNEE ARTHROSCOPY  07/13/2011   Procedure: ARTHROSCOPY KNEE;  Surgeon: Cynda Familia;  Location: [facility];  Service: Orthopedics;  Laterality: Left;  partial menisectomy with  chondrylplasty   KNEE SURGERY Left    LAMINECTOMY AND MICRODISCECTOMY LUMBAR SPINE  MARCH  2008   L3 -  4   LAPAROSCOPIC INCISIONAL / UMBILICAL / VENTRAL HERNIA REPAIR  2006   PACEMAKER IMPLANT N/A 08/02/2018   Procedure: PACEMAKER IMPLANT;  Surgeon: Evans Lance, MD;  Location: [facility];  Service: Cardiovascular;  Laterality: N/A;   PROSTATE BIOPSY     RADIOACTIVE SEED IMPLANT N/A 02/12/2019   Procedure: RADIOACTIVE SEED IMPLANT/BRACHYTHERAPY IMPLANT;  Surgeon: Franchot Gallo, MD;  [facility];  Service: Urology;  Laterality: N/A;  90 MINS   SHOULDER ARTHROSCOPY Right 05-23-2007   SIGMOID COLECTOMY FOR CANCER  1989   SPACE OAR INSTILLATION N/A 02/12/2019   Procedure: SPACE OAR INSTILLATION;  Surgeon: Franchot Gallo, MD;  [facility];  Service: Urology;  Laterality: N/A;   TOTAL KNEE ARTHROPLASTY Left 05/23/2015   Procedure: LEFT TOTAL KNEE ARTHROPLASTY;  Surgeon: Sydnee Cabal, MD;  [facility];  Service: Orthopedics;  Laterality: Left;   VERTICAL BANDED GASTROPLASTY  1986   VIDEO BRONCHOSCOPY N/A 10/30/2019   Procedure: VIDEO BRONCHOSCOPY WITH BRONICAL ALVEROLAR LAVAGE WITHOUT FLUORO;  Surgeon: Julian Hy, DO;  [facility];  Service: Endoscopy;  Laterality: N/A;    Family History  Problem Relation Age of Onset   Stroke Paternal Grandfather    Heart disease Paternal Grandfather    Esophagitis Father        died from perforated esophagus   Breast cancer Mother    Colon cancer Maternal Grandmother        questionable   Esophageal cancer Neg Hx    Prostate cancer Neg Hx    Stomach cancer Neg Hx     Allergies  Allergen Reactions   Contrast Media [Iodinated Contrast Media] Palpitations    TACHYCARDIA- patient states he has tolerated newer agents since this reaction >30 yrs ago    Current Outpatient Medications on File Prior to Visit  Medication Sig Dispense Refill   allopurinol (ZYLOPRIM) 300 MG tablet Take 1 tablet (300 mg total)  by mouth daily. 90 tablet 3   amLODipine (NORVASC) 10 MG tablet Take 1 tablet (10 mg total) by mouth daily. 90 tablet 3   aspirin EC 81 MG tablet Take 1 tablet (81 mg total) by mouth daily. Swallow whole. 90 tablet 3   Calcium Carb-Cholecalciferol (CALCIUM 600 + D PO) Take 1 tablet by mouth daily. 800 units of vitamin D     ipratropium (ATROVENT) 0.03 % nasal spray Place 2 sprays into both nostrils every 12 (twelve) hours. 30 mL 1   Multiple Vitamin (MULTIVITAMIN) tablet Take 1 tablet by mouth daily.     Respiratory Therapy Supplies (FLUTTER) DEVI 1 each by Does not apply route in the morning and at bedtime. 1 each 0   sodium chloride HYPERTONIC 3 % nebulizer solution Take by nebulization as needed for other. 750 mL 12   tamsulosin (FLOMAX) 0.4 MG CAPS capsule Take 0.4 mg by mouth daily.     valsartan-hydrochlorothiazide (DIOVAN-HCT) 320-25 MG tablet TAKE 1 TABLET BY MOUTH DAILY. 90 tablet 3   rosuvastatin (CRESTOR) 20 MG tablet Take 1 tablet (20 mg total) by mouth daily. 90 tablet 3   No current facility-administered medications on file prior to visit.    BP 122/82   Pulse 60   Temp 98 F (36.7 C) (Oral)   Ht '5\' 8"'$  (1.727 m)   Wt 244 lb (110.7 kg)   SpO2 97%   BMI 37.10 kg/m       Objective:   Physical Exam Vitals and nursing note reviewed.  Cardiovascular:     Pulses: Normal pulses.  Skin:    General: Skin is warm and dry.     Capillary Refill: Capillary refill takes less than 2 seconds.  Neurological:     General: No focal deficit present.     Mental Status: He is oriented to person, place, and time.  Psychiatric:        Mood and Affect: Mood normal.        Behavior: Behavior normal.        Thought Content: Thought content normal.        Judgment: Judgment normal.       Assessment & Plan:   1. History of malignant neoplasm of colon - Will fill out required paperwork for him. He will find out where to send his medical records and get back to me   2. PND  (  post-nasal drip)   3. Chronic cough  Dorothyann Peng, NP

## 2022-07-15 ENCOUNTER — Ambulatory Visit (INDEPENDENT_AMBULATORY_CARE_PROVIDER_SITE_OTHER): Payer: Medicare Other | Admitting: Adult Health

## 2022-07-15 ENCOUNTER — Encounter: Payer: Self-pay | Admitting: Adult Health

## 2022-07-15 VITALS — BP 122/70 | HR 60 | Temp 98.2°F | Ht 68.0 in | Wt 242.0 lb

## 2022-07-15 DIAGNOSIS — R0982 Postnasal drip: Secondary | ICD-10-CM | POA: Diagnosis not present

## 2022-07-15 DIAGNOSIS — J9811 Atelectasis: Secondary | ICD-10-CM | POA: Diagnosis not present

## 2022-07-15 DIAGNOSIS — R053 Chronic cough: Secondary | ICD-10-CM | POA: Diagnosis not present

## 2022-07-15 NOTE — Progress Notes (Signed)
Subjective:    Patient ID: Marcus [name], male    DOB: [birth year], 82 y.o.   MRN: [id number]  HPI  82 year old male who  has a past medical history of Acute meniscal tear of knee (left), Arthritis, AV block (08/01/2018), Benign essential hypertension (01/18/2007), Bronchiectasis with acute exacerbation (06/23/2020), Chest pain, atypical (08/23/2014), Chronic cough (10/07/2014), Coronary artery disease, Degeneration of lumbar intervertebral disc (10/14/2017), Diverticulosis of colon (07/07/2005), Elevated PSA (06/19/2014), GERD (gastroesophageal reflux disease) (01/18/2007), Gout, unspecified (01/18/2007), Hemorrhoids (01/19/2011), Hiatal hernia, History of colonic polyps (01/18/2007), Insomnia, unspecified (08/21/2007), Iron deficiency anemia, unspecified  (01/19/2011), Lumbar post-laminectomy syndrome (10/14/2017), Lumbar spondylolysis, Lumbar strain (12/14/2011), Obstructive sleep apnea (01/18/2007), Paraesophageal hernia, Primary osteoarthritis of knee (05/23/2015), Prostate cancer ([place]) (2016), S/P knee replacement (05/23/2015), Sensorineural hearing loss, bilateral, Vitamin B12 deficiency, and Vitamin D deficiency (10/28/2009).  He presents to the office today for follow up regarding his [facility] letter. He would like to focus on the atelectasis with chronic cough and chronic sinus drainage.    Review of Systems See HPI   Past Medical History:  Diagnosis Date   Acute meniscal tear of knee left   Arthritis    generalized   AV block 08/01/2018   Benign essential hypertension 01/18/2007   Bronchiectasis with acute exacerbation 06/23/2020   Chest pain, atypical 08/23/2014   Chronic cough 10/07/2014   Coronary artery disease    Degeneration of lumbar intervertebral disc 10/14/2017   Diverticulosis of colon 07/07/2005   Elevated PSA 06/19/2014   GERD (gastroesophageal reflux disease) 01/18/2007   Gout, unspecified 01/18/2007   Hemorrhoids 01/19/2011   Hiatal hernia    History of  colonic polyps 01/18/2007   Insomnia, unspecified 08/21/2007   Iron deficiency anemia, unspecified  01/19/2011   Lumbar post-laminectomy syndrome 10/14/2017   Lumbar spondylolysis    Lumbar strain 12/14/2011   Obstructive sleep apnea 01/18/2007   uses CPAP nightly   Paraesophageal hernia    large   Primary osteoarthritis of knee 05/23/2015   Prostate cancer ([place]) 2016   S/P knee replacement 05/23/2015   Sensorineural hearing loss, bilateral    Vitamin B12 deficiency    Vitamin D deficiency 10/28/2009    Social History   Socioeconomic History   Marital status: Widowed    Spouse name: Not on file   Number of children: 4   Years of education: 38   Highest education level: Some college, no degree  Occupational History   Occupation: retired    Fish farm manager: [employer]  Tobacco Use   Smoking status: Former    Packs/day: 2.00    Years: 20.00    Total pack years: 40.00    Types: Cigarettes    Quit date: 07/05/1974    Years since quitting: 48.0   Smokeless tobacco: Never  Vaping Use   Vaping Use: Never used  Substance and Sexual Activity   Alcohol use: Not Currently    Alcohol/week: 30.0 standard drinks of alcohol    Types: 30 Standard drinks or equivalent per week    Comment: 4-5 glasses of wine or 2-3 beers nightly   Drug use: No   Sexual activity: Not Currently  Other Topics Concern   Not on file  Social History Narrative   Recently widowed   Former Smoker    Alcohol use-yes 1-2 drinks per day      Occupation: Retired Midwife      Originally from [place], [place] - in [place] > 10 yrs as of 2017  Social Determinants of Health   Financial Resource Strain: Low Risk  (07/28/2021)   Overall Financial Resource Strain (CARDIA)    Difficulty of Paying Living Expenses: Not hard at all  Food Insecurity: No Food Insecurity (07/28/2021)   Hunger Vital Sign    Worried About Running Out of Food in the Last Year: Never true    Ran Out of Food in the Last Year: Never true   Transportation Needs: No Transportation Needs (07/28/2021)   PRAPARE - Hydrologist (Medical): No    Lack of Transportation (Non-Medical): No  Physical Activity: Inactive (07/28/2021)   Exercise Vital Sign    Days of Exercise per Week: 0 days    Minutes of Exercise per Session: 0 min  Stress: No Stress Concern Present (07/28/2021)   [place]    Feeling of Stress : Not at all  Social Connections: Moderately Isolated (08/04/2020)   Social Connection and Isolation Panel [NHANES]    Frequency of Communication with Friends and Family: Three times a week    Frequency of Social Gatherings with Friends and Family: More than three times a week    Attends Religious Services: More than 4 times per year    Active Member of Clubs or Organizations: No    Attends Archivist Meetings: Never    Marital Status: Widowed  Intimate Partner Violence: Not At Risk (08/04/2020)   Humiliation, Afraid, Rape, and Kick questionnaire    Fear of Current or Ex-Partner: No    Emotionally Abused: No    Physically Abused: No    Sexually Abused: No    Past Surgical History:  Procedure Laterality Date   BRONCHIAL WASHINGS  10/30/2019   Procedure: BRONCHIAL WASHINGS;  Surgeon: Julian Hy, DO;  [facility];  Service: Endoscopy;;   CARDIAC CATHETERIZATION     CATARACT EXTRACTION W/ INTRAOCULAR LENS  IMPLANT, BILATERAL Bilateral    COLONOSCOPY     ESOPHAGOGASTRODUODENOSCOPY     KNEE ARTHROSCOPY Right 2007   KNEE ARTHROSCOPY  07/13/2011   Procedure: ARTHROSCOPY KNEE;  Surgeon: Cynda Familia;  Location: [facility];  Service: Orthopedics;  Laterality: Left;  partial menisectomy with chondrylplasty   KNEE SURGERY Left    LAMINECTOMY AND MICRODISCECTOMY LUMBAR SPINE  MARCH  2008   L3 -  4   LAPAROSCOPIC INCISIONAL / UMBILICAL / VENTRAL HERNIA REPAIR  2006   PACEMAKER IMPLANT N/A  08/02/2018   Procedure: PACEMAKER IMPLANT;  Surgeon: Evans Lance, MD;  Location: [facility];  Service: Cardiovascular;  Laterality: N/A;   PROSTATE BIOPSY     RADIOACTIVE SEED IMPLANT N/A 02/12/2019   Procedure: RADIOACTIVE SEED IMPLANT/BRACHYTHERAPY IMPLANT;  Surgeon: Franchot Gallo, MD;  [facility];  Service: Urology;  Laterality: N/A;  90 MINS   SHOULDER ARTHROSCOPY Right 05-23-2007   SIGMOID COLECTOMY FOR CANCER  1989   SPACE OAR INSTILLATION N/A 02/12/2019   Procedure: SPACE OAR INSTILLATION;  Surgeon: Franchot Gallo, MD;  [facility];  Service: Urology;  Laterality: N/A;   TOTAL KNEE ARTHROPLASTY Left 05/23/2015   Procedure: LEFT TOTAL KNEE ARTHROPLASTY;  Surgeon: Sydnee Cabal, MD;  [facility];  Service: Orthopedics;  Laterality: Left;   VERTICAL BANDED GASTROPLASTY  1986   VIDEO BRONCHOSCOPY N/A 10/30/2019   Procedure: VIDEO BRONCHOSCOPY WITH BRONICAL ALVEROLAR LAVAGE WITHOUT FLUORO;  Surgeon: Julian Hy, DO;  [facility];  Service: Endoscopy;  Laterality: N/A;  Family History  Problem Relation Age of Onset   Stroke Paternal Grandfather    Heart disease Paternal Grandfather    Esophagitis Father        died from perforated esophagus   Breast cancer Mother    Colon cancer Maternal Grandmother        questionable   Esophageal cancer Neg Hx    Prostate cancer Neg Hx    Stomach cancer Neg Hx     Allergies  Allergen Reactions   Contrast Media [Iodinated Contrast Media] Palpitations    TACHYCARDIA- patient states he has tolerated newer agents since this reaction >30 yrs ago    Current Outpatient Medications on File Prior to Visit  Medication Sig Dispense Refill   allopurinol (ZYLOPRIM) 300 MG tablet Take 1 tablet (300 mg total) by mouth daily. 90 tablet 3   amLODipine (NORVASC) 10 MG tablet Take 1 tablet (10 mg total) by mouth daily. 90 tablet 3   aspirin EC 81 MG tablet Take 1 tablet (81 mg total) by mouth daily. Swallow  whole. 90 tablet 3   Calcium Carb-Cholecalciferol (CALCIUM 600 + D PO) Take 1 tablet by mouth daily. 800 units of vitamin D     ipratropium (ATROVENT) 0.03 % nasal spray Place 2 sprays into both nostrils every 12 (twelve) hours. 30 mL 1   Multiple Vitamin (MULTIVITAMIN) tablet Take 1 tablet by mouth daily.     Respiratory Therapy Supplies (FLUTTER) DEVI 1 each by Does not apply route in the morning and at bedtime. 1 each 0   sodium chloride HYPERTONIC 3 % nebulizer solution Take by nebulization as needed for other. 750 mL 12   tamsulosin (FLOMAX) 0.4 MG CAPS capsule Take 0.4 mg by mouth daily.     valsartan-hydrochlorothiazide (DIOVAN-HCT) 320-25 MG tablet TAKE 1 TABLET BY MOUTH DAILY. 90 tablet 3   rosuvastatin (CRESTOR) 20 MG tablet Take 1 tablet (20 mg total) by mouth daily. 90 tablet 3   No current facility-administered medications on file prior to visit.    BP 122/70   Pulse 60   Temp 98.2 F (36.8 C) (Oral)   Ht '5\' 8"'$  (1.727 m)   Wt 242 lb (109.8 kg)   SpO2 95%   BMI 36.80 kg/m       Objective:   Physical Exam Vitals and nursing note reviewed.  Constitutional:      Appearance: Normal appearance.  Cardiovascular:     Rate and Rhythm: Normal rate and regular rhythm.     Pulses: Normal pulses.     Heart sounds: Normal heart sounds.  Pulmonary:     Effort: Pulmonary effort is normal.     Breath sounds: Normal breath sounds.  Musculoskeletal:        General: Normal range of motion.  Skin:    General: Skin is warm and dry.     Capillary Refill: Capillary refill takes less than 2 seconds.  Neurological:     General: No focal deficit present.     Mental Status: He is alert and oriented to person, place, and time.  Psychiatric:        Mood and Affect: Mood normal.        Behavior: Behavior normal.        Thought Content: Thought content normal.        Judgment: Judgment normal.        Assessment & Plan:  1. Chronic cough - I informed patient that I have not had  the opportunity to  write his letter but will work on it and hopefully have it done by next week   2. PND (post-nasal drip)   3. Atelectasis  Dorothyann Peng, NP

## 2022-07-22 ENCOUNTER — Telehealth: Payer: Self-pay | Admitting: Adult Health

## 2022-07-22 NOTE — Telephone Encounter (Signed)
Pt is calling and would like to know the status of form that cory was working on. Pt would like a callback

## 2022-07-22 NOTE — Telephone Encounter (Signed)
Please advise 

## 2022-07-23 ENCOUNTER — Encounter: Payer: Self-pay | Admitting: Adult Health

## 2022-07-23 NOTE — Telephone Encounter (Signed)
Noted  

## 2022-08-01 DIAGNOSIS — J479 Bronchiectasis, uncomplicated: Secondary | ICD-10-CM | POA: Diagnosis not present

## 2022-08-10 ENCOUNTER — Telehealth (INDEPENDENT_AMBULATORY_CARE_PROVIDER_SITE_OTHER): Payer: Medicare Other | Admitting: Family Medicine

## 2022-08-10 DIAGNOSIS — Z0000 Encounter for general adult medical examination without abnormal findings: Secondary | ICD-10-CM

## 2022-08-10 NOTE — Patient Instructions (Addendum)
I really enjoyed getting to talk with you today! I am available on Tuesdays and Thursdays for virtual visits if you have any questions or concerns, or if I can be of any further assistance.   CHECKLIST FROM ANNUAL WELLNESS VISIT:  -Follow up (please call to schedule if not scheduled after visit):   -yearly for annual wellness visit with primary care office  Here is a list of your preventive care/health maintenance measures and the plan for each if any are due:  Health Maintenance  Topic Date Due   COLONOSCOPY (Pts 45-39yr Insurance coverage will need to be confirmed)  11/13/2017    COVID-19 Vaccine (4 - 2023-24 season) 03/05/2022   Medicare Annual Wellness (AWV)  08/11/2023   DTaP/Tdap/Td (4 - Td or Tdap) 03/25/2031   Pneumonia Vaccine 83 Years old  Completed   Zoster Vaccines- Shingrix  Completed   HPV VACCINES  Aged Out   INFLUENZA VACCINE  Discontinued    -See a dentist at least yearly  -Get your eyes checked and then per your eye specialist's recommendations  -Other issues addressed today: -sleep:  *could try eliminating screen time for 1-2 hours before bed *dim lights in the evening and get some red light night lights to use if you have to get up at night, ensure not lights in the bedroom - including clock lights, etc. *go to bed at the same time every night and wake up at the same time even if sleep was not great - at morning wake up time turn on bright lights or open curtains *set a calming bedtime routine (shower or listening to calming music, doing puzzle or reading book, light housework, etc.)  *I included more information about sleep below and would be happy to work with you further on this issue as sleep is very important and we don't have a good safe drug for treating sleep.   -alcohol: congratulation on cutting back! Keep working to reduce - no more than 2 5oz pours per 24 hours is good.  -advance directives: I included information below   -I have included below  further information regarding a healthy whole foods based diet, physical activity guidelines for adults, stress management and opportunities for social connections. I hope you find this information useful.     NUTRITION: -eat real food: lots of colorful vegetables (half the plate) and fruits -5-7 servings of vegetables and fruits per day (fresh or steamed is best), exp. 2 servings of vegetables with lunch and dinner and 2 servings of fruit per day. Berries and greens such as kale and collards are great choices.  -consume on a regular basis: whole grains (make sure first ingredient on label contains the word "whole"), fresh fruits, fish, nuts, seeds, healthy oils (such as olive oil, avocado oil, grape seed oil) -may eat small amounts of dairy and lean meat on occasion, but avoid processed meats such as ham, bacon, lunch meat, etc. -drink water -try to avoid fast food and pre-packaged foods, processed meat -most experts advise limiting sodium to < '2300mg'$  per day, should limit further is any chronic conditions such as high blood pressure, heart disease, diabetes, etc. The American Heart Association advised that < '1500mg'$  is is ideal -try to avoid foods that contain any ingredients with names you do not recognize  -try to avoid sugar/sweets (except for the natural sugar that occurs in fresh fruit) -try to avoid sweet drinks -try to avoid white rice, white bread, pasta (unless whole grain), white or yellow potatoes  EXERCISE GUIDELINES FOR ADULTS: -if you wish to increase your physical activity, do so gradually and with the approval of your doctor -STOP and seek medical care immediately if you have any chest pain, chest discomfort or trouble breathing when  starting or increasing exercise  -move and stretch your body, legs, feet and arms when sitting for long periods -Physical activity guidelines for optimal health in adults: -least 150 minutes per week of aerobic exercise (can talk, but not sing) once approved by your doctor, 20-30 minutes of sustained activity or two 10 minute episodes of sustained activity every day.  -resistance training at least 2 days per week if approved by your doctor -balance exercises 3+ days per week:   Stand somewhere where you have something sturdy to hold onto if you lose balance.    1) lift up on toes, start with 5x per day and work up to 20x   2) stand and lift on leg straight out to the side so that foot is a few inches of the floor, start with 5x each side and work up to 20x each side   3) stand on one foot, start with 5 seconds each side and work up to 20 seconds on each side  If you need ideas or help with getting more active:  -Silver sneakers https://tools.silversneakers.com  -Walk with a Doc: http://stephens-thompson.biz/  -try to include resistance (weight lifting/strength building) and balance exercises twice per week: or the following link for ideas: ChessContest.fr  UpdateClothing.com.cy  STRESS MANAGEMENT: -can try meditating, or just sitting quietly with deep breathing while intentionally relaxing all parts of your body for 5 minutes daily -if you need further help with stress, anxiety or depression please follow up with your primary doctor or contact the wonderful folks at [place]: [place]: -options in [place] if you wish to engage in more social and exercise related activities:  -Silver sneakers https://tools.silversneakers.com  -Walk with a Doc: http://stephens-thompson.biz/  -Check out the [place] 50+ section on the [place] of [employer] (hiking clubs, book clubs, cards and games, chess, exercise classes, aquatic classes and much more) - see the website for details: https://www.[place].gov/departments/parks-recreation/active-adults50  -YouTube has lots of exercise videos for different ages and abilities as well  -[place] (a variety of indoor and outdoor inperson activities for adults). [phone]. [address]  -Virtual Online Classes (a variety of topics): see seniorplanet.org or call [phone]  -consider volunteering at a school, hospice center, church, senior center or elsewhere    ADVANCED HEALTHCARE DIRECTIVES:  Everyone should have advanced health care directives in place. This is so that you get the care you want, should you ever be in a situation where you are unable to make your own medical decisions.   From the [place] Advanced Directive Website: "[place] are legal documents in which you give written instructions about your health care if, in the future, you cannot speak for yourself.   A health care power of attorney allows you to name a person you trust to make your health care decisions if you cannot make them yourself. A declaration of a desire for a natural death (or living will) is document, which states that you desire not to have your life prolonged by extraordinary measures if you have a terminal or incurable illness or if you are in a vegetative state. An advance instruction for mental health treatment makes a declaration of instructions, information and preferences regarding your mental  health treatment. It also states that you are aware that the advance instruction authorizes a mental health treatment provider to act according to your wishes. It may also outline your consent or refusal of mental health treatment. A declaration of an anatomical gift allows anyone over the age of 71 to make a gift by will, organ donor card or other  document."   Please see the following website or an elder law attorney for forms, FAQs and for completion of advanced directives: [place] Secretary of [place] ([email or web address])  Or copy and paste the following to your web browser: [email or web address]  FOR IMPROVED SLEEP AND TO RESET YOUR SLEEP SCHEDULE:  '[]'$  Schedule sleep counseling(cognitive behavioral therapy).   [facility] is a good option.   Call for appointment: [phone]  '[]'$  Exercise 30 minutes daily. Avoid caffeine and alcohol - particularly in the evenings.  '[]'$  Go to bed and wake up at the same time everyday.  '[]'$  Keep bedroom cool, dark and quiet  '[]'$  Reserve bed for sleep - do not read, watch TV, look at phone or device, etc., in bed.  '[]'$  If you toss and turn for more then 10-15 minutes, get out of bed and list or journal thoughts or do quiet activity (not screen-time, no tv, phone, computer) then go back to bed. Repeat as needed. Try not to worry about when you will eventually fall asleep.  '[]'$  Some people find that a half dose of benadryl, melatonin, tylenol pm or unisom on a few nights per week is helpful initially for a few weeks.  '[]'$  Seek help for any depression or anxiety.  '[]'$ Prescription strength sleep medications should only be used in severe cases of insomnia if other measures fail and should be used sparingly.  I hope you are feeling better soon! Follow up with your doctor in 3-4 weeks or sooner if your symptoms worsen or new concerns arise.

## 2022-08-10 NOTE — Progress Notes (Signed)
PATIENT CHECK-IN and HEALTH RISK ASSESSMENT QUESTIONNAIRE:  -completed by phone/video for upcoming Medicare Preventive Visit  Pre-Visit Check-in: 1)Vitals (height, wt, BP, etc) - record in vitals section for visit on day of visit 2)Review and Update Medications, Allergies PMH, Surgeries, Social history in Epic 3)Hospitalizations in the last year with date/reason? No  4)Review and Update Care Team (patient's specialists) in Epic 5) Complete PHQ9 in Epic  6) Complete Fall Screening in Epic 7)Review all Health Maintenance Due and order under PCP if not done.  Medicare Wellness Patient Questionnaire:  Answer theses question about your habits: Do you drink alcohol? Yes If yes, how many drinks do you have a day- now no more than 2 glass of wine per night, was drinking more and recently cut back. Reports was not difficult to cut back.  Have you ever smoked?yes   Quit date if applicable?  stopped 45 years How many packs a day do/did you smoke? 2 packs per day Do you use smokeless tobacco?no Do you use an illicit drugs?No Do you exercises? Not during the winter, reports in the summer he grows a large garden and gives the veggies to several food banks.  Are you sexually active? 0 Number of partners?N/A Typical breakfast: donut and coffee  Typical lunch:Sandwich Typical dinner: Meat, broccoli, pasta Typical snacks:Cheese  Beverages: Crystal Light, diet soda  Answer theses question about you: Can you perform most household chores?Yes Do you find it hard to follow a conversation in a noisy room?Yes Do you often ask people to speak up or repeat themselves?Yes, just joined the [place] and will be seeing an audiologist later this month about this Do you feel that you have a problem with memory?Yes Do you balance your checkbook and or bank acounts?yes Do you feel safe at home?Yes Last dentist visit?1 week ago Do you need assistance with any of the following: Please note if so None  Driving?  Feeding  yourself?  Getting from bed to chair?  Getting to the toilet?  Bathing or showering?  Dressing yourself?  Managing money?  Climbing a flight of stairs  Preparing meals?    Do you have Advanced Directives in place (Living Will, Healthcare Power or Attorney)? No   Last eye Exam and location?Upcoming exam on May 16th   Do you currently use prescribed or non-prescribed narcotic or opioid pain medications?None  Do you have a history or close family history of breast, ovarian, tubal or peritoneal cancer or a family member with BRCA (breast cancer susceptibility 1 and 2) gene mutations?  Nurse/Assistant Credentials/time stamp:   ----------------------------------------------------------------------------------------------------------------------------------------------------------------------------------------------------------------------    MEDICARE ANNUAL PREVENTIVE CARE VISIT WITH PROVIDER (Welcome to Medicare, initial annual wellness or annual wellness exam)  Virtual Visit via Video Note  I connected with [place]  on 08/10/2022 by a video enabled telemedicine application and verified that I am speaking with the correct person using two identifiers.  Location patient: home Location provider:work or home office Persons participating in the virtual visit: patient, provider  Concerns and/or follow up today: none per his report.   See HM section in Epic for other details of completed HM.    ROS: negative for report of fevers, unintentional weight loss, vision changes, vision loss, hearing loss or change, chest pain, sob, hemoptysis, melena, hematochezia, hematuria, genital discharge or lesions, falls, bleeding or bruising, loc, thoughts of suicide or self harm, memory loss  Patient-completed extensive health risk assessment - reviewed and discussed with the patient: See Health Risk Assessment completed with patient prior  to the visit either above or in recent phone note. This was  reviewed in detailed with the patient today and appropriate recommendations, orders and referrals were placed as needed per Summary below and patient instructions.   Review of Medical History: -PMH, PSH, Family History and current specialty and care providers reviewed and updated and listed below   Patient Care Team: Dorothyann Peng, NP as PCP - General (Family Medicine) Franchot Gallo, MD as Consulting Physician (Urology) Suella Broad, MD as Consulting Physician (Physical Medicine and Rehabilitation) Melina Schools, MD as Consulting Physician (Orthopedic Surgery)   Past Medical History:  Diagnosis Date   Acute meniscal tear of knee left   Arthritis    generalized   AV block 08/01/2018   Benign essential hypertension 01/18/2007   Bronchiectasis with acute exacerbation 06/23/2020   Chest pain, atypical 08/23/2014   Chronic cough 10/07/2014   Coronary artery disease    Degeneration of lumbar intervertebral disc 10/14/2017   Diverticulosis of colon 07/07/2005   Elevated PSA 06/19/2014   GERD (gastroesophageal reflux disease) 01/18/2007   Gout, unspecified 01/18/2007   Hemorrhoids 01/19/2011   Hiatal hernia    History of colonic polyps 01/18/2007   Insomnia, unspecified 08/21/2007   Iron deficiency anemia, unspecified  01/19/2011   Lumbar post-laminectomy syndrome 10/14/2017   Lumbar spondylolysis    Lumbar strain 12/14/2011   Obstructive sleep apnea 01/18/2007   uses CPAP nightly   Paraesophageal hernia    large   Primary osteoarthritis of knee 05/23/2015   Prostate cancer ([place]) 2016   S/P knee replacement 05/23/2015   Sensorineural hearing loss, bilateral    Vitamin B12 deficiency    Vitamin D deficiency 10/28/2009    Past Surgical History:  Procedure Laterality Date   BRONCHIAL WASHINGS  10/30/2019   Procedure: BRONCHIAL WASHINGS;  Surgeon: Julian Hy, DO;  Location: [facility];  Service: Endoscopy;;   CARDIAC CATHETERIZATION     CATARACT EXTRACTION W/  INTRAOCULAR LENS  IMPLANT, BILATERAL Bilateral    COLONOSCOPY     ESOPHAGOGASTRODUODENOSCOPY     KNEE ARTHROSCOPY Right 2007   KNEE ARTHROSCOPY  07/13/2011   Procedure: ARTHROSCOPY KNEE;  Surgeon: Cynda Familia;  Location: [facility];  Service: Orthopedics;  Laterality: Left;  partial menisectomy with chondrylplasty   KNEE SURGERY Left    LAMINECTOMY AND MICRODISCECTOMY LUMBAR SPINE  MARCH  2008   L3 -  4   LAPAROSCOPIC INCISIONAL / UMBILICAL / VENTRAL HERNIA REPAIR  2006   PACEMAKER IMPLANT N/A 08/02/2018   Procedure: PACEMAKER IMPLANT;  Surgeon: Evans Lance, MD;  Location: [facility];  Service: Cardiovascular;  Laterality: N/A;   PROSTATE BIOPSY     RADIOACTIVE SEED IMPLANT N/A 02/12/2019   Procedure: RADIOACTIVE SEED IMPLANT/BRACHYTHERAPY IMPLANT;  Surgeon: Franchot Gallo, MD;  [facility];  Service: Urology;  Laterality: N/A;  90 MINS   SHOULDER ARTHROSCOPY Right 05-23-2007   SIGMOID COLECTOMY FOR CANCER  1989   SPACE OAR INSTILLATION N/A 02/12/2019   Procedure: SPACE OAR INSTILLATION;  Surgeon: Franchot Gallo, MD;  [facility];  Service: Urology;  Laterality: N/A;   TOTAL KNEE ARTHROPLASTY Left 05/23/2015   Procedure: LEFT TOTAL KNEE ARTHROPLASTY;  Surgeon: Sydnee Cabal, MD;  [facility];  Service: Orthopedics;  Laterality: Left;   VERTICAL BANDED GASTROPLASTY  1986   VIDEO BRONCHOSCOPY N/A 10/30/2019   Procedure: VIDEO BRONCHOSCOPY WITH BRONICAL ALVEROLAR LAVAGE WITHOUT FLUORO;  Surgeon: Julian Hy, DO;  [facility];  Service: Endoscopy;  Laterality: N/A;    Social History   Socioeconomic History   Marital status: Widowed    Spouse name: Not on file   Number of children: 4   Years of education: 62   Highest education level: Some college, no degree  Occupational History   Occupation: retired    Fish farm manager: [employer]  Tobacco Use   Smoking status: Former    Packs/day: 2.00    Years: 20.00    Total  pack years: 40.00    Types: Cigarettes    Quit date: 07/05/1974    Years since quitting: 48.1   Smokeless tobacco: Never  Vaping Use   Vaping Use: Never used  Substance and Sexual Activity   Alcohol use: Not Currently    Alcohol/week: 30.0 standard drinks of alcohol    Types: 30 Standard drinks or equivalent per week    Comment: 4-5 glasses of wine or 2-3 beers nightly   Drug use: No   Sexual activity: Not Currently  Other Topics Concern   Not on file  Social History Narrative   Recently widowed   Former Smoker    Alcohol use-yes 1-2 drinks per day      Occupation: Retired Midwife      Originally from [place], [place] - in [place] > 10 yrs as of 2017      Social Determinants of Health   Financial Resource Strain: [place]  (07/28/2021)   Overall Financial Resource Strain (CARDIA)    Difficulty of Paying Living Expenses: Not hard at all  Food Insecurity: No Food Insecurity (07/28/2021)   Hunger Vital Sign    Worried About Running Out of Food in the Last Year: Never true    [place] in the Last Year: Never true  Transportation Needs: No Transportation Needs (07/28/2021)   PRAPARE - Hydrologist (Medical): No    Lack of Transportation (Non-Medical): No  Physical Activity: Inactive (07/28/2021)   Exercise Vital Sign    Days of Exercise per Week: 0 days    Minutes of Exercise per Session: 0 min  Stress: No Stress Concern Present (07/28/2021)   [place]     Feeling of Stress : Not at all  Social Connections: Moderately Isolated (08/04/2020)   Social Connection and Isolation Panel [NHANES]    Frequency of Communication with Friends and Family: Three times a week    Frequency of Social Gatherings with Friends and Family: More than three times a week    Attends Religious Services: More than 4 times per year    Active Member of Clubs or Organizations: No    Attends Archivist  Meetings: Never    Marital Status: Widowed  Intimate Partner Violence: Not At Risk (08/04/2020)   Humiliation, Afraid, Rape, and Kick questionnaire    Fear of Current or Ex-Partner: No    Emotionally Abused: No    Physically Abused: No    Sexually Abused: No    Family History  Problem Relation Age of Onset   Stroke Paternal Grandfather    Heart disease Paternal Grandfather    Esophagitis Father        died from perforated esophagus   Breast cancer Mother    Colon cancer Maternal Grandmother        questionable   Esophageal cancer Neg Hx    Prostate cancer Neg Hx    Stomach cancer Neg Hx  Current Outpatient Medications on File Prior to Visit  Medication Sig Dispense Refill   allopurinol (ZYLOPRIM) 300 MG tablet Take 1 tablet (300 mg total) by mouth daily. 90 tablet 3   amLODipine (NORVASC) 10 MG tablet Take 1 tablet (10 mg total) by mouth daily. 90 tablet 3   aspirin EC 81 MG tablet Take 1 tablet (81 mg total) by mouth daily. Swallow whole. 90 tablet 3   Calcium Carb-Cholecalciferol (CALCIUM 600 + D PO) Take 1 tablet by mouth daily. 800 units of vitamin D     ipratropium (ATROVENT) 0.03 % nasal spray Place 2 sprays into both nostrils every 12 (twelve) hours. 30 mL 1   Multiple Vitamin (MULTIVITAMIN) tablet Take 1 tablet by mouth daily.     Respiratory Therapy Supplies (FLUTTER) DEVI 1 each by Does not apply route in the morning and at bedtime. 1 each 0   rosuvastatin (CRESTOR) 20 MG tablet Take 1 tablet (20 mg total) by mouth daily. 90 tablet 3   sodium chloride HYPERTONIC 3 % nebulizer solution Take by nebulization as needed for other. 750 mL 12   tamsulosin (FLOMAX) 0.4 MG CAPS capsule Take 0.4 mg by mouth daily.     valsartan-hydrochlorothiazide (DIOVAN-HCT) 320-25 MG tablet TAKE 1 TABLET BY MOUTH DAILY. 90 tablet 3   No current facility-administered medications on file prior to visit.    Allergies  Allergen Reactions   Contrast Media [Iodinated Contrast Media]  Palpitations    TACHYCARDIA- patient states he has tolerated newer agents since this reaction >30 yrs ago       Physical Exam There were no vitals filed for this visit. Estimated body mass index is 36.8 kg/m as calculated from the following:   Height as of 07/15/22: '5\' 8"'$  (1.727 m).   Weight as of 07/15/22: 242 lb (109.8 kg).  EKG (optional): deferred due to virtual visit  GENERAL: alert, oriented, no acute distress detected; full vision exam deferred due to pandemic and/or virtual encounter  HEENT: atraumatic, conjunttiva clear, no obvious abnormalities on inspection of external nose and ears  NECK: normal movements of the head and neck  LUNGS: on inspection no signs of respiratory distress, breathing rate appears normal, no obvious gross SOB, gasping or wheezing  CV: no obvious cyanosis  MS: moves all visible extremities without noticeable abnormality  PSYCH/NEURO: pleasant and cooperative, no obvious depression or anxiety, speech and thought processing grossly intact, Cognitive function grossly intact  Flowsheet Row Video Visit from 08/10/2022 in [facility]  PHQ-9 Total Score 19           08/10/2022    2:31 PM 07/07/2022    7:56 AM 06/22/2022   11:29 AM 02/16/2022   11:28 AM 11/04/2021    1:13 PM  Depression screen PHQ 2/9  Decreased Interest '3 2 1 '$ 0 0  Down, Depressed, Hopeless '3 2 1 1 '$ 0  PHQ - 2 Score '6 4 2 1 '$ 0  Altered sleeping '3 3 3 3   '$ Tired, decreased energy '3 3 3 2   '$ Change in appetite '3 3 1 1   '$ Feeling bad or failure about yourself  '3 3 3 '$ 0   Trouble concentrating 0 '2 3 1   '$ Moving slowly or fidgety/restless 0 '1 2 1   '$ Suicidal thoughts '1 2 2 '$ 0   PHQ-9 Score '19 21 19 9   '$ Difficult doing work/chores Somewhat difficult Very difficult Very difficult Not difficult at all   Lost wife 3 years  ago. Lack motivation since. Improving some. Has discussed with his PCP and is aware of resources. Declines intervention for now. Provided  behavioral health # in handout.  Poor sleep. Inquires about sleeping pills.      08/02/2020    2:19 PM 08/04/2020   11:26 AM 03/24/2021   11:51 AM 07/28/2021   10:00 AM 08/10/2022    2:31 PM  Fall Risk  Falls in the past year?  0  0 1  Was there an injury with Fall?  0   0  Fall Risk Category Calculator  0   2  Fall Risk Category (Retired)  Low     (RETIRED) Patient Fall Risk Level Low fall risk Low fall risk Low fall risk Low fall risk   Patient at Risk for Falls Due to  No Fall Risks  Medication side effect   Fall risk Follow up  Falls evaluation completed;Falls prevention discussed  Falls evaluation completed;Education provided;Falls prevention discussed      SUMMARY AND PLAN:  Encounter for Medicare annual wellness exam   Discussed sleep patterns, disorders, treatment options, risks with sleeping pills. After discussion he has opted to try decreasing light exposure in the evenings, limited screen time before bed, bedtime routine and red light nightlights for when gets up at night. Let him know if wishes to work on sleep further can arrange follow up visit with me. Also discussed otc options for sleep. Discussed applicable health maintenance/preventive health measures and advised and referred or ordered per patient preferences:  Health Maintenance  Topic Date Due   COLONOSCOPY (Pts 45-29yr Insurance coverage will need to be confirmed)  11/13/2017, reports told to do due to age.    COVID-19 Vaccine (4 - 2023-24 season) 03/05/2022, discussed, advised can get at pharmacy if wishes to get updated booster   Medicare Annual Wellness (AWV)  08/11/2023   DTaP/Tdap/Td (4 - Td or Tdap) 03/25/2031   Pneumonia Vaccine 82 Years old  Completed   Zoster Vaccines- Shingrix  Completed   HPV VACCINES  Aged Out   INFLUENZA VACCINE  Discontinued   Education and counseling on the following was provided based on the above review of health and a plan/checklist for the patient, along with additional  information discussed, was provided for the patient in the patient instructions :  -Advised on importance of and resources for completing advanced directives - info provied -reports has hearing exam later this month and back in with audiology -Advised and counseled on maintaining healthy weight and healthy lifestyle - including the importance of a health diet, regular physical activity, social connections and stress management. -Advised and counseled on a whole foods based healthy diet and regular exercise: discussed a heart healthy whole foods based diet at length. A summary of a healthy diet was provided in the Patient Instructions. -congratulated on staying active in garden, discussed possibility of adding in some exercise during the winter. He has exercise equipment.  See pt instructions.  -Advised yearly dental visits at minimum and regular eye exams -Advised and counseled on alcohol use. Congratulated on cutting back - what a great effort and progress he has made.  Discussed risks and encouraged to continue to cut back.   Follow up: see patient instructions   Patient Instructions  I really enjoyed getting to talk with you today! I am available on Tuesdays and Thursdays for virtual visits if you have any questions or concerns, or if I can be of any further assistance.   [place]  WELLNESS VISIT:  -Follow up (please call to schedule if not scheduled after visit):   -yearly for annual wellness visit with primary care office  Here is a list of your preventive care/health maintenance measures and the plan for each if any are due:  Health Maintenance  Topic Date Due   COLONOSCOPY (Pts 45-68yr Insurance coverage will need to be confirmed)  11/13/2017    COVID-19 Vaccine (4 - 2023-24 season) 03/05/2022   Medicare Annual Wellness (AWV)  08/11/2023   DTaP/Tdap/Td (4 - Td or Tdap) 03/25/2031   Pneumonia Vaccine 82 Years old  Completed   Zoster Vaccines- Shingrix  Completed    HPV VACCINES  Aged Out   INFLUENZA VACCINE  Discontinued    -See a dentist at least yearly  -Get your eyes checked and then per your eye specialist's recommendations  -Other issues addressed today: -sleep:  *could try eliminating screen time for 1-2 hours before bed *dim lights in the evening and get some red light night lights to use if you have to get up at night, ensure not lights in the bedroom - including clock lights, etc. *go to bed at the same time every night and wake up at the same time even if sleep was not great - at morning wake up time turn on bright lights or open curtains *set a calming bedtime routine (shower or listening to calming music, doing puzzle or reading book, light housework, etc.)  *I included more information about sleep below and would be happy to work with you further on this issue as sleep is very important and we don't have a good safe drug for treating sleep.   -alcohol: congratulation on cutting back! Keep working to reduce - no more than 2 5oz pours per 24 hours is good.  -advance directives: I included information below   -I have included below further information regarding a healthy whole foods based diet, physical activity guidelines for adults, stress management and opportunities for social connections. I hope you find this information useful.     NUTRITION: -eat real food: lots of colorful vegetables (half the plate) and fruits -5-7 servings of vegetables and fruits per day (fresh or steamed is best), exp. 2 servings of vegetables with lunch and dinner and 2 servings of fruit per day. Berries and greens such as kale and collards are great choices.  -consume on a regular basis: whole grains (make sure  first ingredient on label contains the word "whole"), fresh fruits, fish, nuts, seeds, healthy oils (such as olive oil, avocado oil, grape seed oil) -may eat small amounts of dairy and lean meat on occasion, but avoid processed meats such as ham, bacon, lunch meat, etc. -drink water -try to avoid fast food and pre-packaged foods, processed meat -most experts advise limiting sodium to < '2300mg'$  per day, should limit further is any chronic conditions such as high blood pressure, heart disease, diabetes, etc. The American Heart Association advised that < '1500mg'$  is is ideal -try to avoid foods that contain any ingredients with names you do not recognize  -try to avoid sugar/sweets (except for the natural sugar that occurs in fresh fruit) -try to avoid sweet drinks -try to avoid white rice, white bread, pasta (unless whole grain), white or yellow potatoes  EXERCISE GUIDELINES FOR ADULTS: -if you wish to increase your physical activity, do so gradually and with the approval of your doctor -STOP and seek medical care immediately if you have any chest pain, chest discomfort or trouble breathing  when starting or increasing exercise  -move and stretch your body, legs, feet and arms when sitting for long periods -Physical activity guidelines for optimal health in adults: -least 150 minutes per week of aerobic exercise (can talk, but not sing) once approved by your doctor, 20-30 minutes of sustained activity or two 10 minute episodes of sustained activity every day.  -resistance training at least 2 days per week if approved by your doctor -balance exercises 3+ days per week:   Stand somewhere where you have something sturdy to hold onto if you lose balance.    1) lift up on toes, start with 5x per day and work up to 20x   2) stand and lift on leg straight out to the side so that foot is a few inches of the floor, start with 5x each side and work up to 20x each side   3) stand on one foot, start with 5  seconds each side and work up to 20 seconds on each side  If you need ideas or help with getting more active:  -Silver sneakers https://tools.silversneakers.com  -Walk with a Doc: http://stephens-thompson.biz/  -try to include resistance (weight lifting/strength building) and balance exercises twice per week: or the following link for ideas: ChessContest.fr  UpdateClothing.com.cy  STRESS MANAGEMENT: -can try meditating, or just sitting quietly with deep breathing while intentionally relaxing all parts of your body for 5 minutes daily -if you need further help with stress, anxiety or depression please follow up with your primary doctor or contact the wonderful folks at [place]: [place]: -options in [place] if you wish to engage in more social and exercise related activities:  -Silver sneakers https://tools.silversneakers.com  -Walk with a Doc: http://stephens-thompson.biz/  -Check out the [place] 50+ section on the [place] of [employer] (hiking clubs, book clubs, cards and games, chess, exercise classes, aquatic classes and much more) - see the website for details: https://www.[place].gov/departments/parks-recreation/active-adults50  -YouTube has lots of exercise videos for different ages and abilities as well  -[place] (a variety of indoor and outdoor inperson activities for adults). [phone]. [address]  -Virtual Online Classes (a variety of topics): see seniorplanet.org or call 6031642717  -consider volunteering at a school, hospice center, church, senior center or elsewhere    ADVANCED HEALTHCARE DIRECTIVES:  Everyone should have advanced health care directives in place. This is so that you get the care you want, should you ever be in a situation where you are unable to make your own  medical decisions.   From the [place] Advanced Directive Website: "[place] are legal documents in which you give written instructions about your health care if, in the future, you cannot speak for yourself.   A health care power of attorney allows you to name a person you trust to make your health care decisions if you cannot make them yourself. A declaration of a desire for a natural death (or living will) is document, which states that you desire not to have your life prolonged by extraordinary measures if you have a terminal or incurable illness or if you are in a vegetative state. An advance instruction for mental health treatment makes a declaration of instructions, information and preferences regarding your mental health treatment. It also states that you are aware that the advance instruction authorizes a mental health treatment provider to act according to your wishes. It may also outline your consent or refusal of mental health treatment. A declaration  of an anatomical gift allows anyone over the age of 27 to make a gift by will, organ donor card or other document."   Please see the following website or an elder law attorney for forms, FAQs and for completion of advanced directives: [place] Secretary of [place] ([email or web address].no)  Or copy and paste the following to your web browser: [email or web address]  FOR IMPROVED SLEEP AND TO RESET YOUR SLEEP SCHEDULE:  '[]'$  Schedule sleep counseling(cognitive behavioral therapy).   [facility] [place] is a good option.   Call for appointment: [phone]  '[]'$  Exercise 30 minutes daily. Avoid caffeine and alcohol - particularly in the evenings.  '[]'$  Go to bed and wake up at the same time everyday.  '[]'$  Keep bedroom cool, dark and quiet  '[]'$  Reserve bed for sleep - do not read, watch TV, look at phone or device, etc., in bed.  '[]'$  If you toss and turn for  more then 10-15 minutes, get out of bed and list or journal thoughts or do quiet activity (not screen-time, no tv, phone, computer) then go back to bed. Repeat as needed. Try not to worry about when you will eventually fall asleep.  '[]'$  Some people find that a half dose of benadryl, melatonin, tylenol pm or unisom on a few nights per week is helpful initially for a few weeks.  '[]'$  Seek help for any depression or anxiety.  '[]'$ Prescription strength sleep medications should only be used in severe cases of insomnia if other measures fail and should be used sparingly.  I hope you are feeling better soon! Follow up with your doctor in 3-4 weeks or sooner if your symptoms worsen or new concerns arise.        Lucretia Kern, DO

## 2022-08-16 ENCOUNTER — Ambulatory Visit (INDEPENDENT_AMBULATORY_CARE_PROVIDER_SITE_OTHER): Payer: Medicare Other

## 2022-08-16 DIAGNOSIS — I4430 Unspecified atrioventricular block: Secondary | ICD-10-CM | POA: Diagnosis not present

## 2022-08-17 LAB — CUP PACEART REMOTE DEVICE CHECK
Date Time Interrogation Session: 20240212103917
Implantable Lead Connection Status: 753985
Implantable Lead Connection Status: 753985
Implantable Lead Implant Date: 20200129
Implantable Lead Implant Date: 20200129
Implantable Lead Location: 753859
Implantable Lead Location: 753860
Implantable Lead Model: 377
Implantable Lead Model: 377
Implantable Lead Serial Number: 80947537
Implantable Lead Serial Number: 80970966
Implantable Pulse Generator Implant Date: 20200129
Pulse Gen Model: 407145
Pulse Gen Serial Number: 69503797

## 2022-09-01 DIAGNOSIS — J479 Bronchiectasis, uncomplicated: Secondary | ICD-10-CM | POA: Diagnosis not present

## 2022-09-01 DIAGNOSIS — K649 Unspecified hemorrhoids: Secondary | ICD-10-CM | POA: Diagnosis not present

## 2022-09-01 DIAGNOSIS — G4700 Insomnia, unspecified: Secondary | ICD-10-CM | POA: Diagnosis not present

## 2022-09-29 ENCOUNTER — Ambulatory Visit (INDEPENDENT_AMBULATORY_CARE_PROVIDER_SITE_OTHER): Payer: Medicare Other | Admitting: Adult Health

## 2022-09-29 ENCOUNTER — Encounter: Payer: Self-pay | Admitting: Adult Health

## 2022-09-29 VITALS — BP 150/80 | HR 80 | Temp 98.1°F | Ht 68.0 in | Wt 237.0 lb

## 2022-09-29 DIAGNOSIS — R8290 Unspecified abnormal findings in urine: Secondary | ICD-10-CM | POA: Diagnosis not present

## 2022-09-29 DIAGNOSIS — R143 Flatulence: Secondary | ICD-10-CM | POA: Diagnosis not present

## 2022-09-29 NOTE — Progress Notes (Signed)
Subjective:    Patient ID: Marcus [name], male    DOB: [birth year], 82 y.o.   MRN: [id number]  GI Problem    82 year old male who  has a past medical history of Acute meniscal tear of knee (left), Arthritis, AV block (08/01/2018), Benign essential hypertension (01/18/2007), Bronchiectasis with acute exacerbation (06/23/2020), Chest pain, atypical (08/23/2014), Chronic cough (10/07/2014), Coronary artery disease, Degeneration of lumbar intervertebral disc (10/14/2017), Diverticulosis of colon (07/07/2005), Elevated PSA (06/19/2014), GERD (gastroesophageal reflux disease) (01/18/2007), Gout, unspecified (01/18/2007), Hemorrhoids (01/19/2011), Hiatal hernia, History of colonic polyps (01/18/2007), Insomnia, unspecified (08/21/2007), Iron deficiency anemia, unspecified  (01/19/2011), Lumbar post-laminectomy syndrome (10/14/2017), Lumbar spondylolysis, Lumbar strain (12/14/2011), Obstructive sleep apnea (01/18/2007), Paraesophageal hernia, Primary osteoarthritis of knee (05/23/2015), Prostate cancer ([place]) (2016), S/P knee replacement (05/23/2015), Sensorineural hearing loss, bilateral, Vitamin B12 deficiency, and Vitamin D deficiency (10/28/2009).  He presents to the office today for multiple issues.   He reports that over the last 7-10 days he has been noticing intermittent episodes of a yellow/orange color in his urine and the color will sink to the bottom of the bowel. He has recently started taking a multi vitamin. He does have an appointment with Urology next week   He also reports that he has had a " a lot of gas" lately with loose stools. When questioned about his diet he reports that he has been eating a lot of processed foods and fast food. He does not have any abdominal pain, GERD symptoms, nausea, vomiting, or blood in stool.   Review of Systems See HPI   Past Medical History:  Diagnosis Date   Acute meniscal tear of knee left   Arthritis    generalized   AV block 08/01/2018    Benign essential hypertension 01/18/2007   Bronchiectasis with acute exacerbation 06/23/2020   Chest pain, atypical 08/23/2014   Chronic cough 10/07/2014   Coronary artery disease    Degeneration of lumbar intervertebral disc 10/14/2017   Diverticulosis of colon 07/07/2005   Elevated PSA 06/19/2014   GERD (gastroesophageal reflux disease) 01/18/2007   Gout, unspecified 01/18/2007   Hemorrhoids 01/19/2011   Hiatal hernia    History of colonic polyps 01/18/2007   Insomnia, unspecified 08/21/2007   Iron deficiency anemia, unspecified  01/19/2011   Lumbar post-laminectomy syndrome 10/14/2017   Lumbar spondylolysis    Lumbar strain 12/14/2011   Obstructive sleep apnea 01/18/2007   uses CPAP nightly   Paraesophageal hernia    large   Primary osteoarthritis of knee 05/23/2015   Prostate cancer ([place]) 2016   S/P knee replacement 05/23/2015   Sensorineural hearing loss, bilateral    Vitamin B12 deficiency    Vitamin D deficiency 10/28/2009    Social History   Socioeconomic History   Marital status: Widowed    Spouse name: Not on file   Number of children: 4   Years of education: 85   Highest education level: Some college, no degree  Occupational History   Occupation: retired    Fish farm manager: [employer]  Tobacco Use   Smoking status: Former    Packs/day: 2.00    Years: 20.00    Additional pack years: 0.00    Total pack years: 40.00    Types: Cigarettes    Quit date: 07/05/1974    Years since quitting: 48.2   Smokeless tobacco: Never  Vaping Use   Vaping Use: Never used  Substance and Sexual Activity   Alcohol use: Not Currently    Alcohol/week: 30.0 standard  drinks of alcohol    Types: 30 Standard drinks or equivalent per week    Comment: 4-5 glasses of wine or 2-3 beers nightly   Drug use: No   Sexual activity: Not Currently  Other Topics Concern   Not on file  Social History Narrative   Recently widowed   Former Smoker    Alcohol use-yes 1-2 drinks per day       Occupation: Retired Midwife      Originally from [place], [place] - in [place] > 10 yrs as of 2017      Social Determinants of Health   Financial Resource Strain: [place]  (07/28/2021)   Overall Financial Resource Strain (CARDIA)    Difficulty of Paying Living Expenses: Not hard at all  Food Insecurity: No Food Insecurity (07/28/2021)   Hunger Vital Sign    Worried About Running Out of Food in the Last Year: Never true    [place] in the Last Year: Never true  Transportation Needs: No Transportation Needs (07/28/2021)   PRAPARE - Hydrologist (Medical): No    Lack of Transportation (Non-Medical): No  Physical Activity: Inactive (07/28/2021)   Exercise Vital Sign    Days of Exercise per Week: 0 days    Minutes of Exercise per Session: 0 min  Stress: No Stress Concern Present (07/28/2021)   [place]    Feeling of Stress : Not at all  Social Connections: Moderately Isolated (08/04/2020)   Social Connection and Isolation Panel [NHANES]    Frequency of Communication with Friends and Family: Three times a week    Frequency of Social Gatherings with Friends and Family: More than three times a week    Attends Religious Services: More than 4 times per year    Active Member of Clubs or Organizations: No    Attends Archivist Meetings: Never    Marital Status: Widowed  Intimate Partner Violence: Not At Risk (08/04/2020)   Humiliation, Afraid, Rape, and Kick questionnaire    Fear of Current or Ex-Partner: No    Emotionally Abused: No    Physically Abused: No    Sexually Abused: No    Past Surgical History:  Procedure Laterality Date   BRONCHIAL WASHINGS  10/30/2019   Procedure: BRONCHIAL WASHINGS;  Surgeon: Julian Hy, DO;  Location: [facility];  Service: Endoscopy;;   CARDIAC CATHETERIZATION     CATARACT EXTRACTION W/ INTRAOCULAR LENS  IMPLANT, BILATERAL Bilateral     COLONOSCOPY     ESOPHAGOGASTRODUODENOSCOPY     KNEE ARTHROSCOPY Right 2007   KNEE ARTHROSCOPY  07/13/2011   Procedure: ARTHROSCOPY KNEE;  Surgeon: Cynda Familia;  Location: [facility];  Service: Orthopedics;  Laterality: Left;  partial menisectomy with chondrylplasty   KNEE SURGERY Left    LAMINECTOMY AND MICRODISCECTOMY LUMBAR SPINE  MARCH  2008   L3 -  4   LAPAROSCOPIC INCISIONAL / UMBILICAL / VENTRAL HERNIA REPAIR  2006   PACEMAKER IMPLANT N/A 08/02/2018   Procedure: PACEMAKER IMPLANT;  Surgeon: Evans Lance, MD;  Location: [facility];  Service: Cardiovascular;  Laterality: N/A;   PROSTATE BIOPSY     RADIOACTIVE SEED IMPLANT N/A 02/12/2019   Procedure: RADIOACTIVE SEED IMPLANT/BRACHYTHERAPY IMPLANT;  Surgeon: Franchot Gallo, MD;  Location: [facility];  Service: Urology;  Laterality: N/A;  90 MINS   SHOULDER ARTHROSCOPY Right 05-23-2007   SIGMOID COLECTOMY FOR CANCER  1989   SPACE OAR INSTILLATION N/A 02/12/2019   Procedure: SPACE OAR INSTILLATION;  Surgeon: Franchot Gallo, MD;  Location: [facility];  Service: Urology;  Laterality: N/A;   TOTAL KNEE ARTHROPLASTY Left 05/23/2015   Procedure: LEFT TOTAL KNEE ARTHROPLASTY;  Surgeon: Sydnee Cabal, MD;  Location: [facility];  Service: Orthopedics;  Laterality: Left;   VERTICAL BANDED GASTROPLASTY  1986   VIDEO BRONCHOSCOPY N/A 10/30/2019   Procedure: VIDEO BRONCHOSCOPY WITH BRONICAL ALVEROLAR LAVAGE WITHOUT FLUORO;  Surgeon: Julian Hy, DO;  Location: [facility];  Service: Endoscopy;  Laterality: N/A;    Family History  Problem Relation Age of Onset   Stroke Paternal Grandfather    Heart disease Paternal Grandfather    Esophagitis Father        died from perforated esophagus   Breast cancer Mother    Colon cancer Maternal Grandmother        questionable   Esophageal cancer Neg Hx    Prostate cancer Neg Hx    Stomach cancer Neg Hx     Allergies  Allergen Reactions   Contrast Media [Iodinated  Contrast Media] Palpitations    TACHYCARDIA- patient states he has tolerated newer agents since this reaction >30 yrs ago    Current Outpatient Medications on File Prior to Visit  Medication Sig Dispense Refill   allopurinol (ZYLOPRIM) 300 MG tablet Take 1 tablet (300 mg total) by mouth daily. 90 tablet 3   amLODipine (NORVASC) 10 MG tablet Take 1 tablet (10 mg total) by mouth daily. 90 tablet 3   aspirin EC 81 MG tablet Take 1 tablet (81 mg total) by mouth daily. Swallow whole. 90 tablet 3   Calcium Carb-Cholecalciferol (CALCIUM 600 + D PO) Take 1 tablet by mouth daily. 800 units of vitamin D     Carboxymethylcellulose Sodium (CARBOXYMETHYLCELLULOSE SOD PF OP) INSTILL 1 DROP IN EACH EYE FOUR TIMES A DAY FOR DRY EYE     ipratropium (ATROVENT) 0.03 % nasal spray Place 2 sprays into both nostrils every 12 (twelve) hours. 30 mL 1   Multiple Vitamin (MULTIVITAMIN) tablet Take 1 tablet by mouth daily.     Respiratory Therapy Supplies (FLUTTER) DEVI 1 each by Does not apply route in the morning and at bedtime. 1 each 0   sodium chloride HYPERTONIC 3 % nebulizer solution Take by nebulization as needed for other. 750 mL 12   tamsulosin (FLOMAX) 0.4 MG CAPS capsule Take 0.4 mg by mouth daily.     valsartan-hydrochlorothiazide (DIOVAN-HCT) 320-25 MG tablet TAKE 1 TABLET BY MOUTH DAILY. 90 tablet 3   rosuvastatin (CRESTOR) 20 MG tablet Take 1 tablet (20 mg total) by mouth daily. 90 tablet 3   No current facility-administered medications on file prior to visit.    BP (!) 150/80   Pulse 80   Temp 98.1 F (36.7 C) (Oral)   Ht 5\' 8"  (1.727 m)   Wt 237 lb (107.5 kg)   SpO2 98%   BMI 36.04 kg/m       Objective:   Physical Exam Vitals and nursing note reviewed.  Constitutional:      Appearance: Normal appearance.  Cardiovascular:     Rate and Rhythm: Normal rate and regular rhythm.     Pulses: Normal pulses.     Heart sounds: Normal heart sounds.  Pulmonary:     Effort: Pulmonary effort is  normal.     Breath sounds: Normal breath sounds.  Musculoskeletal:        General: Normal range  of motion.  Skin:    General: Skin is warm and dry.     Capillary Refill: Capillary refill takes less than 2 seconds.  Neurological:     General: No focal deficit present.     Mental Status: He is alert and oriented to person, place, and time.  Psychiatric:        Mood and Affect: Mood normal.        Behavior: Behavior normal.        Thought Content: Thought content normal.        Judgment: Judgment normal.       Assessment & Plan:  1. Abnormal urine - likely from MVI. He was not able to give a urine sample today. He will still go see Urology for his routine exam   2. Excessive gas - Encouraged to cut back on processed/fast foods   Dorothyann Peng, NP

## 2022-09-29 NOTE — Progress Notes (Signed)
Remote pacemaker transmission.   

## 2022-09-30 DIAGNOSIS — J479 Bronchiectasis, uncomplicated: Secondary | ICD-10-CM | POA: Diagnosis not present

## 2022-09-30 DIAGNOSIS — G4700 Insomnia, unspecified: Secondary | ICD-10-CM | POA: Diagnosis not present

## 2022-09-30 DIAGNOSIS — K649 Unspecified hemorrhoids: Secondary | ICD-10-CM | POA: Diagnosis not present

## 2022-10-04 DIAGNOSIS — R310 Gross hematuria: Secondary | ICD-10-CM | POA: Diagnosis not present

## 2022-10-05 ENCOUNTER — Ambulatory Visit: Payer: Medicare Other | Attending: Internal Medicine | Admitting: Internal Medicine

## 2022-10-05 ENCOUNTER — Encounter: Payer: Self-pay | Admitting: Internal Medicine

## 2022-10-05 VITALS — BP 136/82 | HR 63 | Ht 68.0 in | Wt 234.0 lb

## 2022-10-05 DIAGNOSIS — I4430 Unspecified atrioventricular block: Secondary | ICD-10-CM

## 2022-10-05 DIAGNOSIS — I10 Essential (primary) hypertension: Secondary | ICD-10-CM

## 2022-10-05 DIAGNOSIS — I480 Paroxysmal atrial fibrillation: Secondary | ICD-10-CM

## 2022-10-05 DIAGNOSIS — Z950 Presence of cardiac pacemaker: Secondary | ICD-10-CM | POA: Diagnosis not present

## 2022-10-05 LAB — CUP PACEART INCLINIC DEVICE CHECK
Date Time Interrogation Session: 20240402132929
Implantable Lead Connection Status: 753985
Implantable Lead Connection Status: 753985
Implantable Lead Implant Date: 20200129
Implantable Lead Implant Date: 20200129
Implantable Lead Location: 753859
Implantable Lead Location: 753860
Implantable Lead Model: 377
Implantable Lead Model: 377
Implantable Lead Serial Number: 80947537
Implantable Lead Serial Number: 80970966
Implantable Pulse Generator Implant Date: 20200129
Pulse Gen Model: 407145
Pulse Gen Serial Number: 69503797

## 2022-10-05 MED ORDER — WARFARIN SODIUM 5 MG PO TABS: 5.0000 mg | ORAL_TABLET | Freq: Every day | ORAL | 3 refills | Status: DC

## 2022-10-05 NOTE — Patient Instructions (Addendum)
Medication Instructions:  Your physician has recommended you make the following change in your medication: START TAKING Coumadin 5 mg, Daily. You will-  Take 1 tablet (5 mg total) by mouth daily.    Lab Work: None ordered.  If you have labs (blood work) drawn today and your tests are completely normal, you will receive your results only by: [place] (if you have MyChart) OR A paper copy in the mail If you have any lab test that is abnormal or we need to change your treatment, we will call you to review the results.  Testing/Procedures: None ordered.  Follow-Up: A referral order by Dr. Cristopher Peru has been submitted; [facility] will be contacting you to schedule a future appointment.    Provider:   Cristopher Peru, MD{or one of the following Advanced Practice Providers on your designated Care Team:   Tommye Standard, [place] Legrand Como "Jonni Sanger" Chalmers Cater, [place]  Remote monitoring is used to monitor your Pacemaker from home. This monitoring reduces the number of office visits required to check your device to one time per year. It allows [place] to keep an eye on the functioning of your device to ensure it is working properly. You are scheduled for a device check from home on 11/15/22. You may send your transmission at any time that day. If you have a wireless device, the transmission will be sent automatically. After your physician reviews your transmission, you will receive a postcard with your next transmission date.       Warfarin Information Warfarin is a blood thinner (anticoagulant). Anticoagulants help to prevent the formation of blood clots or keep them from getting bigger. Your health care provider will monitor the anticoagulation effect of warfarin closely and will adjust your medicine as needed. Who should use warfarin? Warfarin is prescribed for people who have blood clots, or who are at risk for developing harmful blood clots, such as people who: Have mechanical heart  valves. Have irregular heart rhythms (atrial fibrillation). Have certain clotting disorders. Have had blood clots in the past or are currently receiving treatment for them. This includes people who have had a stroke, blood clots in the lungs (pulmonary embolism, or PE), or blood clots in the legs (deep vein thrombosis,or DVT). How is warfarin taken? Warfarin is taken by mouth (orally). Warfarin tablets come in different strengths. The strength is printed on the tablet, and each strength is a different color. If you get a new prescription and the color of your tablet is different than usual, tell your pharmacist or health care provider immediately. Take warfarin exactly as told by your health care provider, at the same time every day. Doing this helps you avoid bleeding or blood clots that could result in serious injury, pain, or disability. Contact your health care provider if a dose is forgotten or missed. Do not change or take additional dosesto make up for missed or accidental extra doses. What blood tests do I need while taking warfarin?  Warfarin is a medicine that needs to be closely monitored with blood tests. It is very important to keep all lab visits and follow-up visits with your health care provider. These tests measure the blood's ability to clot and are called prothrombin tests (PT)or international normalized ratio (INR) tests. These tests can be done with a finger stick or a blood draw. What does the INR test result mean? The PT test results will be reported as the INR. Your health care provider will tell you your target INR range.  If your INR is not in your target range, your health care provider may adjust your dosage. If your INR is above your target range, there is a risk of bleeding. Your dosage of warfarin may need to be decreased. If your INR is below your target range, there is a risk of clotting. Your dosage of warfarin may need to be increased. How often is the INR test  needed? When you first start warfarin, you will usually have your INR checked every few days until the health care provider determines the correct dosage of warfarin. After you have reached your target INR, your INR will be tested less often. However, you will need to have your INR checked at least once every 4-6 weeks while you take warfarin. Some people may be able to use home monitoring to check their INR. Ask your health care provider if this applies to you. What are the side effects of warfarin? Too much warfarin can cause bleeding or hemorrhage in any part of the body, such as: Bleeding from the gums. Unexplained bruises or bruises that get larger. A nosebleed that is not easily stopped. Bleeding in the brain (hemorrhagic stroke). Coughing up or vomiting blood. Blood in the urine or stools. Warfarin may also cause: Skin rash or irritations. Nausea that does not go away. Severe pain in the back or joints. Painful toes that turn blue or purple (purple toe syndrome). Painful ulcers that do not go away (skin necrosis). What precautions do I need to take while using warfarin? Wear a medical alert bracelet or carry a card that lists what medicines you take. Make sure that all health care providers, including your dentist, know you are taking warfarin. Avoid situations that cause bleeding by: Using a softer toothbrush. Flossing with waxed floss. Shaving with an Copy, not with a blade. Limiting your use of sharp objects. Avoiding activities that put you at risk for injury, such as contact sports. What do I need to know about warfarin and pregnancy or breastfeeding? If you are taking warfarin and you become pregnant, or plan to become pregnant, contact your health care provider right away. Though warfarin has been associated with birth defects, it can be used in some cases after weighing risks to mother and baby. If you plan to breastfeed while taking warfarin, talk with your  health care provider first. What do I need to know about warfarin and alcohol or drug use? Do not drink alcohol if: Your health care provider tells you not to drink. You are pregnant, may be pregnant, or are planning to become pregnant. If you drink alcohol: Limit how much you have to: 0-1 drink a day for women. 0-2 drinks a day for men. Know how much alcohol is in your drink. In the [place] one drink equals one 12 oz bottle of beer (355 mL), one 5 oz glass of wine (148 mL), or one 1 oz glass of hard liquor (44 mL). If you change the amount of alcohol that you drink, tell your health care provider. Your warfarin dosage may need to be changed. Do not use any products that contain nicotine or tobacco. These products include cigarettes, chewing tobacco, and vaping devices, such as e-cigarettes. If you need help quitting, ask your health care provider. If you use nicotine or tobacco products and change the amount that you use, tell your health care provider. Your warfarin dosage may need to be changed. Avoid drug use while taking warfarin. The effects of drugs on warfarin are  not known. What do I need to know about warfarin and other medicines or supplements? Many prescription and over-the-counter medicines can interfere with warfarin. Talk with your health care provider or your pharmacist before starting or stopping any new medicines. This includes vitamins, herbs, supplements, and pain medicines. Some common over-the-counter medicines that may increase the risk of dangerous bleeding while taking warfarin include: Aspirin. NSAIDs, such as ibuprofen or naproxen. Vitamin E. Fish oils. What do I need to know about warfarin and my diet? Vitamin K decreases the effect of warfarin, and it is found in many foods. Eat a consistent amount of foods that contain vitamin K. For example, you may decide to eat 2 servings of vitamin K-containing foods each day. It is important to maintain a normal, balanced diet  while taking warfarin. Avoid major changes in your diet. If you are going to change your diet, talk with your health care provider before making changes. Your health care provider may recommend that you work with a dietitian. Contact a health care provider if you: Miss a dose. Take an extra dose. Plan to have any kind of surgery or procedure. Ask whether you should stop taking warfarin or change your dose before your surgery. Are unable to take your medicine due to nausea, vomiting, or diarrhea. Have any major changes in your diet, or you plan to make major changes in your diet. Start or stop any over-the-counter medicine, prescription medicine, herbal supplement, or dietary supplement. Become pregnant, plan to become pregnant, or think you may be pregnant. Have menstrual periods that are heavier than usual. Have unusual bruising. Get help right away if you: Have signs of an allergic reaction, such as: Swelling of the lips, face, tongue, mouth, or throat. Rash or itchy, red, swollen areas of skin (hives). Trouble breathing. Chest tightness. Fall or have an accident, especially if you hit your head. Have signs that your blood is too thin, such as: Blood in your urine. Your urine may look reddish, pinkish, or tea-colored. Blood in your stool. Your stool may be black or bright red. Coughing up or vomiting blood. The blood may be bright red, or it may look like coffee grounds. Bleeding that does not stop after applying pressure to the area for 30 minutes. Have signs of a blood clot in your leg or arm, such as: Pain or swelling in your leg or arm. Skin that is red or warm to the touch on your arm or leg. Have signs of blood in your lung, such as: Shortness of breath or difficulty breathing. Chest pain. Unexplained fever. Have any symptoms of a stroke. "BE FAST" is an easy way to remember the main warning signs of a stroke: B - Balance. Signs are dizziness, sudden trouble walking, or loss  of balance. E - Eyes. Signs are trouble seeing or a sudden change in vision. F - Face. Signs are sudden weakness or numbness of the face, or the face or eyelid drooping on one side. A - Arms. Signs are weakness or numbness in an arm. This happens suddenly and usually on one side of the body. S - Speech. Signs are sudden trouble speaking, slurred speech, or trouble understanding what people say. T - Time. Time to call emergency services. Write down what time symptoms started. Have other signs of a stroke, such as: A sudden, severe headache with no known cause. Nausea or vomiting. Seizure. Have other signs of a reaction to warfarin, such as: Purple or blue toes. Skin ulcers that  do not go away. These symptoms may represent a serious problem that is an emergency. Do not wait to see if the symptoms will go away. Get medical help right away. Call your local emergency services (911 in the [place] Do not drive yourself to the hospital. Summary Warfarin is a medicine that thins blood. It is used to prevent or treat blood clots. You must be monitored closely while on this medicine. Keep all follow-up visits. Make sure that you know your target INR range and your warfarin dosage. Wear or carry identification that says you are taking warfarin. Take warfarin at the same time every day. Call your health care provider if you miss a dose or if you take an extra dose. Do not change the dosage of warfarin on your own. Know the signs and symptoms of blood clots, bleeding, and a stroke. Know when to get emergency medical help. This information is not intended to replace advice given to you by your health care provider. Make sure you discuss any questions you have with your health care provider. Document Revised: 09/10/2020 Document Reviewed: 09/10/2020 Elsevier Patient Education  Ali Molina.

## 2022-10-05 NOTE — Progress Notes (Signed)
HPI Mr. Hibdon returns today for followup of his CHB, s/p PPM insertion. He has prostate CA and is s/p XRT. He has preserved LV function. He has not had any problems with his PPM. He admits to dietary indiscretion. He would like to lose weight. He is down about 8 lbs. He denies palpitations.  Allergies  Allergen Reactions   Contrast Media [Iodinated Contrast Media] Palpitations    TACHYCARDIA- patient states he has tolerated newer agents since this reaction >30 yrs ago     Current Outpatient Medications  Medication Sig Dispense Refill   allopurinol (ZYLOPRIM) 300 MG tablet Take 1 tablet (300 mg total) by mouth daily. 90 tablet 3   amLODipine (NORVASC) 10 MG tablet Take 1 tablet (10 mg total) by mouth daily. 90 tablet 3   Calcium Carb-Cholecalciferol (CALCIUM 600 + D PO) Take 1 tablet by mouth daily. 800 units of vitamin D     Carboxymethylcellulose Sodium (CARBOXYMETHYLCELLULOSE SOD PF OP) INSTILL 1 DROP IN EACH EYE FOUR TIMES A DAY FOR DRY EYE     ipratropium (ATROVENT) 0.03 % nasal spray Place 2 sprays into both nostrils every 12 (twelve) hours. 30 mL 1   Multiple Vitamin (MULTIVITAMIN) tablet Take 1 tablet by mouth daily.     Respiratory Therapy Supplies (FLUTTER) DEVI 1 each by Does not apply route in the morning and at bedtime. 1 each 0   sodium chloride HYPERTONIC 3 % nebulizer solution Take by nebulization as needed for other. 750 mL 12   tamsulosin (FLOMAX) 0.4 MG CAPS capsule Take 0.4 mg by mouth daily.     valsartan-hydrochlorothiazide (DIOVAN-HCT) 320-25 MG tablet TAKE 1 TABLET BY MOUTH DAILY. 90 tablet 3   warfarin (COUMADIN) 5 MG tablet Take 1 tablet (5 mg total) by mouth daily. 90 tablet 3   rosuvastatin (CRESTOR) 20 MG tablet Take 1 tablet (20 mg total) by mouth daily. 90 tablet 3   No current facility-administered medications for this visit.     Past Medical History:  Diagnosis Date   Acute meniscal tear of knee left   Arthritis    generalized   AV block  08/01/2018   Benign essential hypertension 01/18/2007   Bronchiectasis with acute exacerbation 06/23/2020   Chest pain, atypical 08/23/2014   Chronic cough 10/07/2014   Coronary artery disease    Degeneration of lumbar intervertebral disc 10/14/2017   Diverticulosis of colon 07/07/2005   Elevated PSA 06/19/2014   GERD (gastroesophageal reflux disease) 01/18/2007   Gout, unspecified 01/18/2007   Hemorrhoids 01/19/2011   Hiatal hernia    History of colonic polyps 01/18/2007   Insomnia, unspecified 08/21/2007   Iron deficiency anemia, unspecified  01/19/2011   Lumbar post-laminectomy syndrome 10/14/2017   Lumbar spondylolysis    Lumbar strain 12/14/2011   Obstructive sleep apnea 01/18/2007   uses CPAP nightly   Paraesophageal hernia    large   Primary osteoarthritis of knee 05/23/2015   Prostate cancer 2016   S/P knee replacement 05/23/2015   Sensorineural hearing loss, bilateral    Vitamin B12 deficiency    Vitamin D deficiency 10/28/2009    ROS:   All systems reviewed and negative except as noted in the HPI.   Past Surgical History:  Procedure Laterality Date   BRONCHIAL WASHINGS  10/30/2019   Procedure: BRONCHIAL WASHINGS;  Surgeon: Julian Hy, DO;  Location: [facility];  Service: Endoscopy;;   CARDIAC CATHETERIZATION     CATARACT EXTRACTION W/ INTRAOCULAR LENS  IMPLANT, BILATERAL Bilateral  COLONOSCOPY     ESOPHAGOGASTRODUODENOSCOPY     KNEE ARTHROSCOPY Right 2007   KNEE ARTHROSCOPY  07/13/2011   Procedure: ARTHROSCOPY KNEE;  Surgeon: Cynda Familia;  Location: [facility];  Service: Orthopedics;  Laterality: Left;  partial menisectomy with chondrylplasty   KNEE SURGERY Left    LAMINECTOMY AND MICRODISCECTOMY LUMBAR SPINE  MARCH  2008   L3 -  4   LAPAROSCOPIC INCISIONAL / UMBILICAL / VENTRAL HERNIA REPAIR  2006   PACEMAKER IMPLANT N/A 08/02/2018   Procedure: PACEMAKER IMPLANT;  Surgeon: Evans Lance, MD;  Location: [facility];   Service: Cardiovascular;  Laterality: N/A;   PROSTATE BIOPSY     RADIOACTIVE SEED IMPLANT N/A 02/12/2019   Procedure: RADIOACTIVE SEED IMPLANT/BRACHYTHERAPY IMPLANT;  Surgeon: Franchot Gallo, MD;  Location: [facility];  Service: Urology;  Laterality: N/A;  90 MINS   SHOULDER ARTHROSCOPY Right 05-23-2007   SIGMOID COLECTOMY FOR CANCER  1989   SPACE OAR INSTILLATION N/A 02/12/2019   Procedure: SPACE OAR INSTILLATION;  Surgeon: Franchot Gallo, MD;  Location: [facility];  Service: Urology;  Laterality: N/A;   TOTAL KNEE ARTHROPLASTY Left 05/23/2015   Procedure: LEFT TOTAL KNEE ARTHROPLASTY;  Surgeon: Sydnee Cabal, MD;  Location: [facility];  Service: Orthopedics;  Laterality: Left;   VERTICAL BANDED GASTROPLASTY  1986   VIDEO BRONCHOSCOPY N/A 10/30/2019   Procedure: VIDEO BRONCHOSCOPY WITH BRONICAL ALVEROLAR LAVAGE WITHOUT FLUORO;  Surgeon: Julian Hy, DO;  Location: [facility];  Service: Endoscopy;  Laterality: N/A;     Family History  Problem Relation Age of Onset   Stroke Paternal Grandfather    Heart disease Paternal Grandfather    Esophagitis Father        died from perforated esophagus   Breast cancer Mother    Colon cancer Maternal Grandmother        questionable   Esophageal cancer Neg Hx    Prostate cancer Neg Hx    Stomach cancer Neg Hx      Social History   Socioeconomic History   Marital status: Widowed    Spouse name: Not on file   Number of children: 4   Years of education: 31   Highest education level: Some college, no degree  Occupational History   Occupation: retired    Fish farm manager: [employer]  Tobacco Use   Smoking status: Former    Packs/day: 2.00    Years: 20.00    Additional pack years: 0.00    Total pack years: 40.00    Types: Cigarettes    Quit date: 07/05/1974    Years since quitting: 48.2   Smokeless tobacco: Never  Vaping Use   Vaping Use: Never used  Substance and Sexual Activity   Alcohol use: Not Currently    Alcohol/week: 30.0  standard drinks of alcohol    Types: 30 Standard drinks or equivalent per week    Comment: 4-5 glasses of wine or 2-3 beers nightly   Drug use: No   Sexual activity: Not Currently  Other Topics Concern   Not on file  Social History Narrative   Recently widowed   Former Smoker    Alcohol use-yes 1-2 drinks per day      Occupation: Retired Midwife      Originally from [place], [place] - in [place] > 10 yrs as of 2017      Social Determinants of Health   Financial Resource Strain: [place]  (07/28/2021)   Overall Emergency planning/management officer Strain (  CARDIA)    Difficulty of Paying Living Expenses: Not hard at all  Food Insecurity: No Food Insecurity (07/28/2021)   Hunger Vital Sign    Worried About Running Out of Food in the Last Year: Never true    Ran Out of Food in the Last Year: Never true  Transportation Needs: No Transportation Needs (07/28/2021)   PRAPARE - Hydrologist (Medical): No    Lack of Transportation (Non-Medical): No  Physical Activity: Inactive (07/28/2021)   Exercise Vital Sign    Days of Exercise per Week: 0 days    Minutes of Exercise per Session: 0 min  Stress: No Stress Concern Present (07/28/2021)   [place]    Feeling of Stress : Not at all  Social Connections: Moderately Isolated (08/04/2020)   Social Connection and Isolation Panel [NHANES]    Frequency of Communication with Friends and Family: Three times a week    Frequency of Social Gatherings with Friends and Family: More than three times a week    Attends Religious Services: More than 4 times per year    Active Member of Clubs or Organizations: No    Attends Archivist Meetings: Never    Marital Status: Widowed  Intimate Partner Violence: Not At Risk (08/04/2020)   Humiliation, Afraid, Rape, and Kick questionnaire    Fear of Current or Ex-Partner: No    Emotionally Abused: No    Physically Abused: No     Sexually Abused: No     BP 136/82   Pulse 63   Ht 5\' 8"  (1.727 m)   Wt 234 lb (106.1 kg)   SpO2 98%   BMI 35.58 kg/m   Physical Exam:  Well appearing NAD HEENT: Unremarkable Neck:  No JVD, no thyromegally Lymphatics:  No adenopathy Back:  No CVA tenderness Lungs:  Clear HEART:  Regular rate rhythm, no murmurs, no rubs, no clicks Abd:  soft, positive bowel sounds, no organomegally, no rebound, no guarding Ext:  2 plus pulses, no edema, no cyanosis, no clubbing Skin:  No rashes no nodules Neuro:  CN II through XII intact, motor grossly intact  EKG A V sequential pacing  DEVICE  Normal device function.  See PaceArt for details. Afib/flutter is noted  Assess/Plan: 1. Obesity - his weight is down about 8 lbs. We will follow. 2. PPM -his biotronik DDD PM is working normally.  3. HTN - his bp is minimally elevated. No change in meds. I encouraged him on weight loss. 4. Dyslipidemia - he will continue his statin therapy.  5. PAF - on his PPM he has had up to 1.5 hours of atrial fib/flutter. His CHADSVASC is at least 3. I have recommended starting systemic anti-coagulation. He prefers to start with coumadin. We will arranged followup. I gave him a script for 5 mg daily. Coumadin clinic in a week. Carleene Overlie Nahome Bublitz,MD

## 2022-10-06 ENCOUNTER — Telehealth: Payer: Self-pay | Admitting: Adult Health

## 2022-10-06 NOTE — Telephone Encounter (Signed)
Please advise 

## 2022-10-06 NOTE — Telephone Encounter (Signed)
Pt would like to have a sleep study.  Pt is asking if NP knows where he can go, or can NP create a referral for him?  Please advise.  LOV:  09/29/22

## 2022-10-07 ENCOUNTER — Other Ambulatory Visit: Payer: Self-pay | Admitting: Adult Health

## 2022-10-07 NOTE — Telephone Encounter (Signed)
Noted  

## 2022-10-07 NOTE — Telephone Encounter (Signed)
Patient notified of update  and verbalized understanding. 

## 2022-10-11 ENCOUNTER — Ambulatory Visit: Payer: Medicare Other | Attending: Internal Medicine

## 2022-10-11 DIAGNOSIS — I4891 Unspecified atrial fibrillation: Secondary | ICD-10-CM

## 2022-10-11 DIAGNOSIS — I482 Chronic atrial fibrillation, unspecified: Secondary | ICD-10-CM | POA: Insufficient documentation

## 2022-10-11 DIAGNOSIS — Z7901 Long term (current) use of anticoagulants: Secondary | ICD-10-CM | POA: Diagnosis not present

## 2022-10-11 LAB — POCT INR: INR: 1.7 — AB (ref 2.0–3.0)

## 2022-10-11 NOTE — Patient Instructions (Addendum)
Description   START taking 1 tablet daily EXCEPT 1/2 tablet on Sundays and Thursdays.  Stay consistent with greens each week (1-2 per week)  Coumadin Clinic [phone]      A full discussion of the nature of anticoagulants has been carried out.  A benefit risk analysis has been presented to the patient, so that they understand the justification for choosing anticoagulation at this time. The need for frequent and regular monitoring, precise dosage adjustment and compliance is stressed.  Side effects of potential bleeding are discussed.  The patient should avoid any OTC items containing aspirin or ibuprofen, and should avoid great swings in general diet.  Avoid alcohol consumption.  Call if any signs of abnormal bleeding.

## 2022-10-18 ENCOUNTER — Ambulatory Visit: Payer: Medicare Other | Attending: Internal Medicine | Admitting: *Deleted

## 2022-10-18 DIAGNOSIS — I4891 Unspecified atrial fibrillation: Secondary | ICD-10-CM | POA: Diagnosis not present

## 2022-10-18 DIAGNOSIS — Z7901 Long term (current) use of anticoagulants: Secondary | ICD-10-CM | POA: Diagnosis not present

## 2022-10-18 LAB — POCT INR: INR: 2 (ref 2.0–3.0)

## 2022-10-18 NOTE — Patient Instructions (Signed)
Description   Continue taking warfarin 1 tablet daily EXCEPT 1/2 tablet on Sundays and Thursdays.  Stay consistent with greens each week (1-2 per week) Make Coumadin Clinic aware of changes in Wine intake Coumad[facility] [phone]

## 2022-10-25 ENCOUNTER — Ambulatory Visit: Payer: Medicare Other | Attending: Internal Medicine | Admitting: Pharmacist

## 2022-10-25 DIAGNOSIS — Z7901 Long term (current) use of anticoagulants: Secondary | ICD-10-CM

## 2022-10-25 DIAGNOSIS — I4891 Unspecified atrial fibrillation: Secondary | ICD-10-CM

## 2022-10-25 LAB — POCT INR: INR: 2.8 (ref 2.0–3.0)

## 2022-10-25 NOTE — Patient Instructions (Addendum)
  Description   Continue taking warfarin 1 tablet daily EXCEPT 1/2 tablet on Sundays and Thursdays.  Stay consistent with greens each week (1-2 per week) Make Coumadin Clinic aware of changes in Wine intake Recheck INR in 10 days Coumadin Clinic [phone]

## 2022-10-26 DIAGNOSIS — M961 Postlaminectomy syndrome, not elsewhere classified: Secondary | ICD-10-CM | POA: Diagnosis not present

## 2022-10-26 DIAGNOSIS — M5416 Radiculopathy, lumbar region: Secondary | ICD-10-CM | POA: Diagnosis not present

## 2022-10-26 DIAGNOSIS — M5136 Other intervertebral disc degeneration, lumbar region: Secondary | ICD-10-CM | POA: Diagnosis not present

## 2022-10-31 DIAGNOSIS — J479 Bronchiectasis, uncomplicated: Secondary | ICD-10-CM | POA: Diagnosis not present

## 2022-10-31 DIAGNOSIS — G4700 Insomnia, unspecified: Secondary | ICD-10-CM | POA: Diagnosis not present

## 2022-10-31 DIAGNOSIS — K649 Unspecified hemorrhoids: Secondary | ICD-10-CM | POA: Diagnosis not present

## 2022-11-04 ENCOUNTER — Ambulatory Visit: Payer: Medicare Other | Attending: Internal Medicine | Admitting: *Deleted

## 2022-11-04 DIAGNOSIS — I4891 Unspecified atrial fibrillation: Secondary | ICD-10-CM | POA: Diagnosis not present

## 2022-11-04 DIAGNOSIS — Z7901 Long term (current) use of anticoagulants: Secondary | ICD-10-CM

## 2022-11-04 LAB — POCT INR: POC INR: 2.7

## 2022-11-04 NOTE — Patient Instructions (Addendum)
Description   Continue taking warfarin 1 tablet daily EXCEPT 1/2 tablet on Sundays and Thursdays.  Stay consistent with greens each week (1-2 per week) Make Coumadin Clinic aware of changes in Wine intake Recheck INR in 3 weeks Coumadin Clinic [phone]

## 2022-11-15 ENCOUNTER — Ambulatory Visit (INDEPENDENT_AMBULATORY_CARE_PROVIDER_SITE_OTHER): Payer: Medicare Other

## 2022-11-15 DIAGNOSIS — I4430 Unspecified atrioventricular block: Secondary | ICD-10-CM | POA: Diagnosis not present

## 2022-11-16 LAB — CUP PACEART REMOTE DEVICE CHECK
Battery Voltage: 65
Date Time Interrogation Session: 20240513080931
Implantable Lead Connection Status: 753985
Implantable Lead Connection Status: 753985
Implantable Lead Implant Date: 20200129
Implantable Lead Implant Date: 20200129
Implantable Lead Location: 753859
Implantable Lead Location: 753860
Implantable Lead Model: 377
Implantable Lead Model: 377
Implantable Lead Serial Number: 80947537
Implantable Lead Serial Number: 80970966
Implantable Pulse Generator Implant Date: 20200129
Pulse Gen Model: 407145
Pulse Gen Serial Number: 69503797

## 2022-11-19 ENCOUNTER — Telehealth: Payer: Self-pay

## 2022-11-19 NOTE — Telephone Encounter (Signed)
The patient called to report dark/orange (not red) urine along with "pudding like stool".    I told him to reach out to his primary physician to further evaluate.  He has an INR check for 5/23 and we can f/u then.

## 2022-11-23 ENCOUNTER — Other Ambulatory Visit: Payer: Self-pay | Admitting: Adult Health

## 2022-11-23 NOTE — Telephone Encounter (Signed)
  The original prescription was discontinued on 05/25/2022 by [name], NP. Renewing this prescription may not be appropriate.    Sig: TAKE 1 CAPSULE (40 MG TOTAL) BY MOUT

## 2022-11-24 ENCOUNTER — Ambulatory Visit (INDEPENDENT_AMBULATORY_CARE_PROVIDER_SITE_OTHER): Payer: Medicare Other

## 2022-11-24 ENCOUNTER — Ambulatory Visit (INDEPENDENT_AMBULATORY_CARE_PROVIDER_SITE_OTHER): Payer: Medicare Other | Admitting: Adult Health

## 2022-11-24 ENCOUNTER — Encounter: Payer: Self-pay | Admitting: Adult Health

## 2022-11-24 VITALS — BP 120/80 | HR 59 | Temp 97.7°F | Ht 68.0 in | Wt 221.0 lb

## 2022-11-24 DIAGNOSIS — R634 Abnormal weight loss: Secondary | ICD-10-CM | POA: Diagnosis not present

## 2022-11-24 NOTE — Progress Notes (Signed)
Subjective:    Patient ID: Marcus [name], male    DOB: 12-20-1940, 82 y.o.   MRN: [id number]  HPI  82 year old male who  has a past medical history of Acute meniscal tear of knee (left), Arthritis, AV block (08/01/2018), Benign essential hypertension (01/18/2007), Bronchiectasis with acute exacerbation (06/23/2020), Chest pain, atypical (08/23/2014), Chronic cough (10/07/2014), Coronary artery disease, Degeneration of lumbar intervertebral disc (10/14/2017), Diverticulosis of colon (07/07/2005), Elevated PSA (06/19/2014), GERD (gastroesophageal reflux disease) (01/18/2007), Gout, unspecified (01/18/2007), Hemorrhoids (01/19/2011), Hiatal hernia, History of colonic polyps (01/18/2007), Insomnia, unspecified (08/21/2007), Iron deficiency anemia, unspecified  (01/19/2011), Lumbar post-laminectomy syndrome (10/14/2017), Lumbar spondylolysis, Lumbar strain (12/14/2011), Obstructive sleep apnea (01/18/2007), Paraesophageal hernia, Primary osteoarthritis of knee (05/23/2015), Prostate cancer (HCC) (2016), S/P knee replacement (05/23/2015), Sensorineural hearing loss, bilateral, Vitamin B12 deficiency, and Vitamin D deficiency (10/28/2009).  He presents to the office today for concern of unintentional weight loss.  He has lost roughly 16 pounds over the last 2 months.  He does report that is eating less and eating healthier food.  He has also been working in his garden most days and has planted over 200 plants.  He denies any fevers, chills, nausea, or vomiting.  Overall he feels well.  Does have a history of prostate cancer, last PSA a year ago was 0.04.  Due to his striae of cancer he has some concern of recurrence.   Review of Systems See HPI   Past Medical History:  Diagnosis Date   Acute meniscal tear of knee left   Arthritis    generalized   AV block 08/01/2018   Benign essential hypertension 01/18/2007   Bronchiectasis with acute exacerbation 06/23/2020   Chest pain, atypical 08/23/2014    Chronic cough 10/07/2014   Coronary artery disease    Degeneration of lumbar intervertebral disc 10/14/2017   Diverticulosis of colon 07/07/2005   Elevated PSA 06/19/2014   GERD (gastroesophageal reflux disease) 01/18/2007   Gout, unspecified 01/18/2007   Hemorrhoids 01/19/2011   Hiatal hernia    History of colonic polyps 01/18/2007   Insomnia, unspecified 08/21/2007   Iron deficiency anemia, unspecified  01/19/2011   Lumbar post-laminectomy syndrome 10/14/2017   Lumbar spondylolysis    Lumbar strain 12/14/2011   Obstructive sleep apnea 01/18/2007   uses CPAP nightly   Paraesophageal hernia    large   Primary osteoarthritis of knee 05/23/2015   Prostate cancer (HCC) 2016   S/P knee replacement 05/23/2015   Sensorineural hearing loss, bilateral    Vitamin B12 deficiency    Vitamin D deficiency 10/28/2009    Social History   Socioeconomic History   Marital status: Widowed    Spouse name: Not on file   Number of children: 4   Years of education: 49   Highest education level: Some college, no degree  Occupational History   Occupation: retired    Associate Professor: [employer]  Tobacco Use   Smoking status: Former    Packs/day: 2.00    Years: 20.00    Additional pack years: 0.00    Total pack years: 40.00    Types: Cigarettes    Quit date: 07/05/1974    Years since quitting: 48.4   Smokeless tobacco: Never  Vaping Use   Vaping Use: Never used  Substance and Sexual Activity   Alcohol use: Not Currently    Alcohol/week: 30.0 standard drinks of alcohol    Types: 30 Standard drinks or equivalent per week    Comment: 4-5 glasses of wine  or 2-3 beers nightly   Drug use: No   Sexual activity: Not Currently  Other Topics Concern   Not on file  Social History Narrative   Recently widowed   Former Smoker    Alcohol use-yes 1-2 drinks per day      Occupation: Retired Associate Professor      Originally from [place], [place] - in [place] > 10 yrs as of 2017      Social Determinants of  Health   Financial Resource Strain: Low Risk  (07/28/2021)   Overall Financial Resource Strain (CARDIA)    Difficulty of Paying Living Expenses: Not hard at all  Food Insecurity: No Food Insecurity (07/28/2021)   Hunger Vital Sign    Worried About Running Out of Food in the Last Year: Never true    Ran Out of Food in the Last Year: Never true  Transportation Needs: No Transportation Needs (07/28/2021)   PRAPARE - Administrator, Civil Service (Medical): No    Lack of Transportation (Non-Medical): No  Physical Activity: Inactive (07/28/2021)   Exercise Vital Sign    Days of Exercise per Week: 0 days    Minutes of Exercise per Session: 0 min  Stress: No Stress Concern Present (07/28/2021)   [employer] of Occupational Health - Occupational Stress Questionnaire    Feeling of Stress : Not at all  Social Connections: Moderately Isolated (08/04/2020)   Social Connection and Isolation Panel [NHANES]    Frequency of Communication with Friends and Family: Three times a week    Frequency of Social Gatherings with Friends and Family: More than three times a week    Attends Religious Services: More than 4 times per year    Active Member of Clubs or Organizations: No    Attends Banker Meetings: Never    Marital Status: Widowed  Intimate Partner Violence: Not At Risk (08/04/2020)   Humiliation, Afraid, Rape, and Kick questionnaire    Fear of Current or Ex-Partner: No    Emotionally Abused: No    Physically Abused: No    Sexually Abused: No    Past Surgical History:  Procedure Laterality Date   BRONCHIAL WASHINGS  10/30/2019   Procedure: BRONCHIAL WASHINGS;  Surgeon: [name], DO;  Location: [facility];  Service: Endoscopy;;   CARDIAC CATHETERIZATION     CATARACT EXTRACTION W/ INTRAOCULAR LENS  IMPLANT, BILATERAL Bilateral    COLONOSCOPY     ESOPHAGOGASTRODUODENOSCOPY     KNEE ARTHROSCOPY Right 2007   KNEE ARTHROSCOPY  07/13/2011   Procedure: ARTHROSCOPY  KNEE;  Surgeon: Erasmo Leventhal;  Location: [facility];  Service: Orthopedics;  Laterality: Left;  partial menisectomy with chondrylplasty   KNEE SURGERY Left    LAMINECTOMY AND MICRODISCECTOMY LUMBAR SPINE  MARCH  2008   L3 -  4   LAPAROSCOPIC INCISIONAL / UMBILICAL / VENTRAL HERNIA REPAIR  2006   PACEMAKER IMPLANT N/A 08/02/2018   Procedure: PACEMAKER IMPLANT;  Surgeon: Marinus Maw, MD;  Location: [facility];  Service: Cardiovascular;  Laterality: N/A;   PROSTATE BIOPSY     RADIOACTIVE SEED IMPLANT N/A 02/12/2019   Procedure: RADIOACTIVE SEED IMPLANT/BRACHYTHERAPY IMPLANT;  Surgeon: Marcine Matar, MD;  Location: [facility];  Service: Urology;  Laterality: N/A;  90 MINS   SHOULDER ARTHROSCOPY Right 05-23-2007   SIGMOID COLECTOMY FOR CANCER  1989   SPACE OAR INSTILLATION N/A 02/12/2019   Procedure: SPACE OAR INSTILLATION;  Surgeon: Marcine Matar, MD;  Location: [facility];  Service: Urology;  Laterality: N/A;   TOTAL KNEE ARTHROPLASTY Left 05/23/2015   Procedure: LEFT TOTAL KNEE ARTHROPLASTY;  Surgeon: Eugenia Mcalpine, MD;  Location: [facility];  Service: Orthopedics;  Laterality: Left;   VERTICAL BANDED GASTROPLASTY  1986   VIDEO BRONCHOSCOPY N/A 10/30/2019   Procedure: VIDEO BRONCHOSCOPY WITH BRONICAL ALVEROLAR LAVAGE WITHOUT FLUORO;  Surgeon: [name], DO;  Location[facility];  Service: Endoscopy;  Laterality: N/A;    Family History  Problem Relation Age of Onset   Stroke Paternal Grandfather    Heart disease Paternal Grandfather    Esophagitis Father        died from perforated esophagus   Breast cancer Mother    Colon cancer Maternal Grandmother        questionable   Esophageal cancer Neg Hx    Prostate cancer Neg Hx    Stomach cancer Neg Hx     Allergies  Allergen Reactions   Contrast Media [Iodinated Contrast Media] Palpitations    TACHYCARDIA- patient states he has tolerated newer agents since this reaction >30 yrs ago    Current  Outpatient Medications on File Prior to Visit  Medication Sig Dispense Refill   allopurinol (ZYLOPRIM) 300 MG tablet Take 1 tablet (300 mg total) by mouth daily. 90 tablet 3   amLODipine (NORVASC) 10 MG tablet Take 1 tablet (10 mg total) by mouth daily. 90 tablet 3   Calcium Carb-Cholecalciferol (CALCIUM 600 + D PO) Take 1 tablet by mouth daily. 800 units of vitamin D     Carboxymethylcellulose Sodium (CARBOXYMETHYLCELLULOSE SOD PF OP) INSTILL 1 DROP IN EACH EYE FOUR TIMES A DAY FOR DRY EYE     ipratropium (ATROVENT) 0.03 % nasal spray Place 2 sprays into both nostrils every 12 (twelve) hours. 30 mL 1   Multiple Vitamin (MULTIVITAMIN) tablet Take 1 tablet by mouth daily.     Respiratory Therapy Supplies (FLUTTER) DEVI 1 each by Does not apply route in the morning and at bedtime. 1 each 0   sodium chloride HYPERTONIC 3 % nebulizer solution Take by nebulization as needed for other. 750 mL 12   tamsulosin (FLOMAX) 0.4 MG CAPS capsule Take 0.4 mg by mouth daily.     valsartan-hydrochlorothiazide (DIOVAN-HCT) 320-25 MG tablet TAKE 1 TABLET BY MOUTH DAILY. 90 tablet 3   warfarin (COUMADIN) 5 MG tablet Take 1 tablet (5 mg total) by mouth daily. 90 tablet 3   rosuvastatin (CRESTOR) 20 MG tablet Take 1 tablet (20 mg total) by mouth daily. 90 tablet 3   No current facility-administered medications on file prior to visit.    BP 120/80   Pulse (!) 59   Temp 97.7 F (36.5 C) (Oral)   Ht 5\' 8"  (1.727 m)   Wt 221 lb (100.2 kg)   SpO2 97%   BMI 33.60 kg/m       Objective:   Physical Exam Vitals and nursing note reviewed.  Constitutional:      Appearance: Normal appearance.  Cardiovascular:     Rate and Rhythm: Normal rate and regular rhythm.     Pulses: Normal pulses.     Heart sounds: Normal heart sounds.  Pulmonary:     Effort: Pulmonary effort is normal.     Breath sounds: Normal breath sounds.  Abdominal:     General: Abdomen is flat.     Palpations: Abdomen is soft.   Musculoskeletal:        General: Normal range of motion.  Skin:  General: Skin is warm and dry.     Capillary Refill: Capillary refill takes less than 2 seconds.  Neurological:     General: No focal deficit present.     Mental Status: He is alert and oriented to person, place, and time.  Psychiatric:        Mood and Affect: Mood normal.        Behavior: Behavior normal.        Thought Content: Thought content normal.        Judgment: Judgment normal.       Assessment & Plan:   1. Unintentional weight loss -What possibly could be due to decreased portion size and eating healthier food as well as working in his garden pretty steadily over the last month or so.  Due to history though we will check blood work, chest x-ray and likely order CT chest/abdomen/pelvis. - CBC; Future - Comprehensive metabolic panel; Future - HIV Antibody (routine testing w rflx); Future - TSH; Future - Sedimentation Rate; Future - C-reactive Protein; Future - PSA; Future - Hemoglobin A1c; Future - Hep C Antibody; Future - DG Chest 2 View; Future - Hep C Antibody - Hemoglobin A1c - PSA - C-reactive Protein - Sedimentation Rate - TSH - HIV Antibody (routine testing w rflx) - Comprehensive metabolic panel - CBC   [name], NP

## 2022-11-25 ENCOUNTER — Ambulatory Visit: Payer: Medicare Other | Attending: Internal Medicine | Admitting: *Deleted

## 2022-11-25 ENCOUNTER — Other Ambulatory Visit: Payer: Self-pay | Admitting: Adult Health

## 2022-11-25 ENCOUNTER — Telehealth: Payer: Self-pay | Admitting: Adult Health

## 2022-11-25 DIAGNOSIS — R748 Abnormal levels of other serum enzymes: Secondary | ICD-10-CM

## 2022-11-25 DIAGNOSIS — R634 Abnormal weight loss: Secondary | ICD-10-CM

## 2022-11-25 DIAGNOSIS — Z5181 Encounter for therapeutic drug level monitoring: Secondary | ICD-10-CM

## 2022-11-25 DIAGNOSIS — Z7901 Long term (current) use of anticoagulants: Secondary | ICD-10-CM | POA: Diagnosis not present

## 2022-11-25 DIAGNOSIS — I4891 Unspecified atrial fibrillation: Secondary | ICD-10-CM | POA: Diagnosis not present

## 2022-11-25 LAB — C-REACTIVE PROTEIN: CRP: 1.2 mg/dL (ref 0.5–20.0)

## 2022-11-25 LAB — CBC
HCT: 39.3 % (ref 39.0–52.0)
Hemoglobin: 12.7 g/dL — ABNORMAL LOW (ref 13.0–17.0)
MCHC: 32.3 g/dL (ref 30.0–36.0)
MCV: 92.3 fl (ref 78.0–100.0)
Platelets: 251 10*3/uL (ref 150.0–400.0)
RBC: 4.26 Mil/uL (ref 4.22–5.81)
RDW: 16.8 % — ABNORMAL HIGH (ref 11.5–15.5)
WBC: 5.3 10*3/uL (ref 4.0–10.5)

## 2022-11-25 LAB — SEDIMENTATION RATE: Sed Rate: 64 mm/hr — ABNORMAL HIGH (ref 0–20)

## 2022-11-25 LAB — COMPREHENSIVE METABOLIC PANEL
ALT: 308 U/L — ABNORMAL HIGH (ref 0–53)
AST: 231 U/L — ABNORMAL HIGH (ref 0–37)
Albumin: 3.5 g/dL (ref 3.5–5.2)
Alkaline Phosphatase: 893 U/L — ABNORMAL HIGH (ref 39–117)
BUN: 27 mg/dL — ABNORMAL HIGH (ref 6–23)
CO2: 25 mEq/L (ref 19–32)
Calcium: 8.8 mg/dL (ref 8.4–10.5)
Chloride: 108 mEq/L (ref 96–112)
Creatinine, Ser: 1.3 mg/dL (ref 0.40–1.50)
GFR: 51.01 mL/min — ABNORMAL LOW (ref >60.00)
Glucose, Bld: 104 mg/dL — ABNORMAL HIGH (ref 70–99)
Potassium: 3.7 mEq/L (ref 3.5–5.1)
Sodium: 143 mEq/L (ref 135–145)
Total Bilirubin: 3.8 mg/dL — ABNORMAL HIGH (ref 0.2–1.2)
Total Protein: 6.1 g/dL (ref 6.0–8.3)

## 2022-11-25 LAB — POCT INR: POC INR: 4.3

## 2022-11-25 LAB — PSA: PSA: 0.22 ng/mL (ref 0.10–4.00)

## 2022-11-25 LAB — HIV ANTIBODY (ROUTINE TESTING W REFLEX): HIV 1&2 Ab, 4th Generation: NONREACTIVE

## 2022-11-25 LAB — TSH: TSH: 0.54 u[IU]/mL (ref 0.35–5.50)

## 2022-11-25 LAB — HEPATITIS C ANTIBODY: Hepatitis C Ab: NONREACTIVE

## 2022-11-25 LAB — HEMOGLOBIN A1C: Hgb A1c MFr Bld: 6.3 % (ref 4.6–6.5)

## 2022-11-25 NOTE — Telephone Encounter (Signed)
Updated patient on labs. Liver enzymes severally elevated. Will order CT abdomen

## 2022-11-25 NOTE — Patient Instructions (Addendum)
Description   Hold warfarin 5/24 and 5/25, then continue taking warfarin 1 tablet daily EXCEPT 1/2 tablet on Sundays and Thursdays.  Stay consistent with greens each week (1-2 per week) Make Coumadin Clinic aware of changes in Wine intake Recheck INR in 2 weeks Coumadin Clinic [phone]

## 2022-11-30 ENCOUNTER — Other Ambulatory Visit: Payer: Self-pay | Admitting: Adult Health

## 2022-11-30 ENCOUNTER — Ambulatory Visit (INDEPENDENT_AMBULATORY_CARE_PROVIDER_SITE_OTHER): Payer: Medicare Other | Admitting: Family Medicine

## 2022-11-30 ENCOUNTER — Encounter: Payer: Self-pay | Admitting: Family Medicine

## 2022-11-30 VITALS — BP 139/75 | HR 57 | Temp 98.4°F | Ht 68.0 in | Wt 223.2 lb

## 2022-11-30 DIAGNOSIS — R7401 Elevation of levels of liver transaminase levels: Secondary | ICD-10-CM | POA: Diagnosis not present

## 2022-11-30 DIAGNOSIS — L299 Pruritus, unspecified: Secondary | ICD-10-CM

## 2022-11-30 DIAGNOSIS — G4700 Insomnia, unspecified: Secondary | ICD-10-CM | POA: Diagnosis not present

## 2022-11-30 DIAGNOSIS — J479 Bronchiectasis, uncomplicated: Secondary | ICD-10-CM | POA: Diagnosis not present

## 2022-11-30 DIAGNOSIS — E785 Hyperlipidemia, unspecified: Secondary | ICD-10-CM | POA: Diagnosis not present

## 2022-11-30 DIAGNOSIS — I4891 Unspecified atrial fibrillation: Secondary | ICD-10-CM | POA: Diagnosis not present

## 2022-11-30 DIAGNOSIS — K649 Unspecified hemorrhoids: Secondary | ICD-10-CM | POA: Diagnosis not present

## 2022-11-30 LAB — CBC
HCT: 37.7 % — ABNORMAL LOW (ref 39.0–52.0)
Hemoglobin: 12.1 g/dL — ABNORMAL LOW (ref 13.0–17.0)
MCHC: 32.2 g/dL (ref 30.0–36.0)
MCV: 92.6 fl (ref 78.0–100.0)
Platelets: 229 10*3/uL (ref 150.0–400.0)
RBC: 4.07 Mil/uL — ABNORMAL LOW (ref 4.22–5.81)
RDW: 16.5 % — ABNORMAL HIGH (ref 11.5–15.5)
WBC: 5.1 10*3/uL (ref 4.0–10.5)

## 2022-11-30 LAB — COMPREHENSIVE METABOLIC PANEL
ALT: 272 U/L — ABNORMAL HIGH (ref 0–53)
AST: 177 U/L — ABNORMAL HIGH (ref 0–37)
Albumin: 3.3 g/dL — ABNORMAL LOW (ref 3.5–5.2)
Alkaline Phosphatase: 760 U/L — ABNORMAL HIGH (ref 39–117)
BUN: 22 mg/dL (ref 6–23)
CO2: 27 mEq/L (ref 19–32)
Calcium: 8.8 mg/dL (ref 8.4–10.5)
Chloride: 104 mEq/L (ref 96–112)
Creatinine, Ser: 1.23 mg/dL (ref 0.40–1.50)
GFR: 54.51 mL/min — ABNORMAL LOW (ref >60.00)
Glucose, Bld: 145 mg/dL — ABNORMAL HIGH (ref 70–99)
Potassium: 4.1 mEq/L (ref 3.5–5.1)
Sodium: 140 mEq/L (ref 135–145)
Total Bilirubin: 4.3 mg/dL — ABNORMAL HIGH (ref 0.2–1.2)
Total Protein: 5.7 g/dL — ABNORMAL LOW (ref 6.0–8.3)

## 2022-11-30 LAB — GAMMA GT: GGT: 1006 U/L — ABNORMAL HIGH (ref 7–51)

## 2022-11-30 MED ORDER — HYDROXYZINE HCL 50 MG PO TABS: 50.0000 mg | ORAL_TABLET | Freq: Three times a day (TID) | ORAL | 0 refills | Status: DC | PRN

## 2022-11-30 NOTE — Progress Notes (Signed)
   Marcus [name] is a 82 y.o. male who presents today for an office visit.  Assessment/Plan:  Pruritus Likely related to his acute hepatitis.  No obvious rashes or other sources of pruritus today.  He is undergoing workup for his recently found elevated transaminases below.  Will treat symptomatically with hydroxyzine 50 mg 3 times daily as needed and Sarna lotion for now.  We discussed reasons to return to care.  Elevated transaminases Found by PCP last week in setting of unintentional weight loss.  Concern for potential obstructive pattern with alk phos 893, AST 231, ALT 308, total bilirubin 3.8.  INR was also elevated at 4.3 and the Coumadin clinic has been adjusting his dose of warfarin.  We will recheck his labs today including the above plus an acute hepatitis9 panel.  Also recommended patient's stop his Crestor until we can determine the source of his acute hepatitis.  Does have an upcoming CT scan however he has not yet heard back from the scheduling department.  He will let us or his PCP know if this has not been scheduled within the next couple of days.  Afib Anticoagulated on warfarin.  Managed by Coumadin clinic.  Will be rechecking INR today.  No signs of bleeding though we need to closely monitor this especially with above hepatitis.  Dyslipidemia Holding Crestor pending further work up.     Subjective:  HPI:  Patient here with diffuse pruritus.  Started about 3 days ago.  No rash.  No fevers or chills.  Otherwise feels fine.  Predominately located on chest back and upper extremities.  Has tried using over-the-counter remedies without much improvement.  Allegra did not help.  Pain is so intense that is keeping him from sleeping.  He did visit his PCP last week with concern for unintentional weight loss.  PCP checked labs which were notable for significant elevation of alk phos, AST, and ALT.  Bilirubin also elevated at 3.8.  He has noticed more loose stools recently but  otherwise no abdominal pain.  No nausea or vomiting.  No fevers or chills.       Objective:  Physical Exam: BP 139/75   Pulse (!) 57   Temp 98.4 F (36.9 C) (Temporal)   Ht 5\' 8"  (1.727 m)   Wt 223 lb 3.2 oz (101.2 kg)   SpO2 97%   BMI 33.94 kg/m   Wt Readings from Last 3 Encounters:  11/30/22 223 lb 3.2 oz (101.2 kg)  11/24/22 221 lb (100.2 kg)  10/05/22 234 lb (106.1 kg)  Gen: No acute distress, resting comfortably Skin: No appreciable rashes. Neuro: Grossly normal, moves all extremities Psych: Normal affect and thought content      Westley Blass M. Jimmey Ralph, MD 11/30/2022 12:58 PM

## 2022-11-30 NOTE — Patient Instructions (Addendum)
It was very nice to see you today!  I think your itching is probably coming from your liver problems.  Please use the hydroxyzine as needed.   You can also use Sarna lotion.  Please stop your rosuvastatin.  We will check blood work today.  Return if symptoms worsen or fail to improve.   Take care, Dr Jimmey Ralph  PLEASE NOTE:  If you had any lab tests, please let us know if you have not heard back within a few days. You may see your results on mychart before we have a chance to review them but we will give you a call once they are reviewed by [place].   If we ordered any referrals today, please let us know if you have not heard from their office within the next week.   If you had any urgent prescriptions sent in today, please check with the pharmacy within an hour of our visit to make sure the prescription was transmitted appropriately.   Please try these tips to maintain a healthy lifestyle:  Eat at least 3 REAL meals and 1-2 snacks per day.  Aim for no more than 5 hours between eating.  If you eat breakfast, please do so within one hour of getting up.   Each meal should contain half fruits/vegetables, one quarter protein, and one quarter carbs (no bigger than a computer mouse)  Cut down on sweet beverages. This includes juice, soda, and sweet tea.   Drink at least 1 glass of water with each meal and aim for at least 8 glasses per day  Exercise at least 150 minutes every week.

## 2022-12-01 ENCOUNTER — Other Ambulatory Visit: Payer: Self-pay | Admitting: Adult Health

## 2022-12-01 ENCOUNTER — Other Ambulatory Visit: Payer: Self-pay

## 2022-12-01 DIAGNOSIS — R634 Abnormal weight loss: Secondary | ICD-10-CM

## 2022-12-01 DIAGNOSIS — R7401 Elevation of levels of liver transaminase levels: Secondary | ICD-10-CM

## 2022-12-01 DIAGNOSIS — R748 Abnormal levels of other serum enzymes: Secondary | ICD-10-CM

## 2022-12-01 LAB — HEPATITIS PANEL, ACUTE
Hep A IgM: NONREACTIVE
Hep B C IgM: NONREACTIVE
Hepatitis B Surface Ag: NONREACTIVE
Hepatitis C Ab: NONREACTIVE

## 2022-12-01 NOTE — Telephone Encounter (Signed)
Pt is not sure if he should be taking this medication, states that if you dont think he needs it not to worry about the refill. It seems like it was d/c on 05/25/22, please advise if ok to send a new Rx

## 2022-12-02 NOTE — Progress Notes (Signed)
He still has a lot of inflammation in his liver but slighty better.  This is probably where all of his symptoms are coming from.  He needs to make sure he gets his CT scan ASAP as previously planned by his PCP.  He should let us know if the itching is not improving.  Katina Degree. Jimmey Ralph, MD 12/02/2022 12:55 PM

## 2022-12-03 ENCOUNTER — Ambulatory Visit (HOSPITAL_BASED_OUTPATIENT_CLINIC_OR_DEPARTMENT_OTHER)
Admission: RE | Admit: 2022-12-03 | Discharge: 2022-12-03 | Disposition: A | Discharge status: Home / Self Care | Payer: Medicare Other | Source: Ambulatory Visit | Attending: Adult Health | Admitting: Adult Health

## 2022-12-03 DIAGNOSIS — K8689 Other specified diseases of pancreas: Secondary | ICD-10-CM | POA: Diagnosis not present

## 2022-12-03 DIAGNOSIS — R7401 Elevation of levels of liver transaminase levels: Secondary | ICD-10-CM | POA: Diagnosis not present

## 2022-12-03 DIAGNOSIS — R748 Abnormal levels of other serum enzymes: Secondary | ICD-10-CM | POA: Diagnosis not present

## 2022-12-03 DIAGNOSIS — R634 Abnormal weight loss: Secondary | ICD-10-CM | POA: Diagnosis not present

## 2022-12-03 DIAGNOSIS — K769 Liver disease, unspecified: Secondary | ICD-10-CM | POA: Diagnosis not present

## 2022-12-03 MED ORDER — IOHEXOL 300 MG/ML  SOLN
100.0000 mL | Freq: Once | INTRAMUSCULAR | Status: AC | PRN
  Administered 2022-12-03: 80 mL via INTRAVENOUS

## 2022-12-06 ENCOUNTER — Other Ambulatory Visit: Payer: Self-pay

## 2022-12-06 ENCOUNTER — Telehealth: Payer: Self-pay | Admitting: Internal Medicine

## 2022-12-06 ENCOUNTER — Ambulatory Visit (INDEPENDENT_AMBULATORY_CARE_PROVIDER_SITE_OTHER): Payer: Medicare Other | Admitting: Pulmonary Disease

## 2022-12-06 ENCOUNTER — Telehealth: Payer: Self-pay | Admitting: *Deleted

## 2022-12-06 ENCOUNTER — Encounter (HOSPITAL_BASED_OUTPATIENT_CLINIC_OR_DEPARTMENT_OTHER): Payer: Self-pay | Admitting: Pulmonary Disease

## 2022-12-06 VITALS — BP 126/66 | HR 64 | Temp 98.2°F | Ht 68.0 in | Wt 220.0 lb

## 2022-12-06 DIAGNOSIS — J479 Bronchiectasis, uncomplicated: Secondary | ICD-10-CM

## 2022-12-06 DIAGNOSIS — G4733 Obstructive sleep apnea (adult) (pediatric): Secondary | ICD-10-CM

## 2022-12-06 DIAGNOSIS — K8689 Other specified diseases of pancreas: Secondary | ICD-10-CM

## 2022-12-06 MED ORDER — PREDNISONE 10 MG PO TABS: 10.0000 mg | ORAL_TABLET | Freq: Every day | ORAL | 0 refills | Status: DC

## 2022-12-06 NOTE — Progress Notes (Signed)
   Subjective:    Patient ID: Marcus [name], male    DOB: August 03, 1940, 82 y.o.   MRN: [id number]  HPI  82 yo for follow-up of chronic cough and OSA Extensive evaluation only showed mild bronchiectasis. PMH -bariatric surgery He has undergone ENT and GI evaluation for GERD. He underwent extensive evaluation for chronic productive cough with sticky white sputum, BAL was negative for AFB CT only showed very mild bronchiectasis and faint groundglass opacity right upper lobe with a pulmonary nodule  45-month follow-up visit. He has been losing significant amount of weight.  He weighed 259 pounds February 2022 and he only weighs 220 pounds now.  PCP perform CT abdomen/pelvis which showed hypodense lesions in the liver, intrahepatic biliary dilatation and suspected pancreatic mass.  He is awaiting further workup and understandably is quite anxious about this. Cough is doing much better. He reports using his CPAP hours every night.  No problems with mask or pressure   Significant tests/ events reviewed   PFTs 08/2020 nml   10/30/2019 BAL cell count with differential: 185 cells, 2% PMNs, 80% monocytes, 15% lymphocytes   Micro: 10/30/19 BAL culture-normal flora 10/30/19 BAL fungus-KOH smear negative 10/30/2019 BAL AFB- negative    HRCT chest 09/27/19- Scattered groundglass opacities and tree-in-bud opacities.  Bilateral basilar linear scars.  Mild bronchiectasis bilateral lower lobes, right middle lobe, right upper lobe, lingula.  Minimal air-trapping.   Likely small hiatal hernia.   03/2022 CT sinuses >> minimal mucosal thickening   Review of Systems neg for any significant sore throat, dysphagia, itching, sneezing, nasal congestion or excess/ purulent secretions, fever, chills, sweats, unintended wt loss, pleuritic or exertional cp, hempoptysis, orthopnea pnd or change in chronic leg swelling. Also denies presyncope, palpitations, heartburn, abdominal pain, nausea, vomiting, diarrhea or change in  bowel or urinary habits, dysuria,hematuria, rash, arthralgias, visual complaints, headache, numbness weakness or ataxia.     Objective:   Physical Exam  Gen. Pleasant, well-nourished, in no distress ENT - no thrush, no pallor/icterus,no post nasal drip Neck: No JVD, no thyromegaly, no carotid bruits Lungs: no use of accessory muscles, no dullness to percussion, clear without rales or rhonchi  Cardiovascular: Rhythm regular, heart sounds  normal, no murmurs or gallops, no peripheral edema Musculoskeletal: No deformities, no cyanosis or clubbing         Assessment & Plan:

## 2022-12-06 NOTE — Telephone Encounter (Signed)
CRITICAL VALUE STICKER  CRITICAL VALUE: abnormal CT abd/pelvis  RECEIVER (on-site recipient of call): Fleet Contras CMA  DATE & TIME NOTIFIED: 12/06/22 8:34 am  MESSENGER (representative from lab): Daine  MD NOTIFIED: Ardyth Harps  TIME OF NOTIFICATION: 8:34 am  RESPONSE:  waiting on response

## 2022-12-06 NOTE — Telephone Encounter (Signed)
Spoke to patient and informed him on his CT results which showed   IMPRESSION: 1. Suspected pancreatic mass within the proximal body, not well-defined by CT. Potential mass measures approximately 2 cm. Relationship to the adjacent superior mesenteric artery and veins is difficult to define on the current exam, however there is likely mass effect on the superior aspect of the superior mesenteric vein. Recommend pancreatic protocol MRI for further evaluation, if pacemaker is MRI compatible. 2. Biliary and pancreatic ductal dilatation. 3. Multiple hypodense liver lesions, majority of which are 10 mm or less, however many are new from remote prior CT. Lesions are indeterminate, but given the are new, suspicious for metastatic disease in the setting. 4. Prior bariatric surgery, presumed gastric bypass. The excluded gastric remnant is distended with contrast consistent with gastro gastric fistula. When compared with 2018 upper GI, this is chronic. 5. Two cystic lesions within the pancreatic tail measuring 15 mm, nonspecific. 6. Layering stones or sludge in the gallbladder.    He has a pacemaker - will reach out to his cardiologist to see if he can have an MRI with his pacemaker.   Also Stat referral to GI   Reports iching is intense and atarax has not helped much. Will send in a few days of prednisone to see if this helps

## 2022-12-06 NOTE — Assessment & Plan Note (Signed)
CPAP download was reviewed which shows control of events on 9 cm.  Usage has dropped and he has a mild mask leak. We discussed CPAP compliance and cardiovascular implications.  If his weight loss continues, he may be able to come off CPAP

## 2022-12-06 NOTE — Telephone Encounter (Signed)
There is an urgent referral for this patient to be seen for a pancreatic mass, please advise on scheduling

## 2022-12-06 NOTE — Patient Instructions (Signed)
Try to use CPAP at least 4 hours every night. Good luck with testing

## 2022-12-06 NOTE — Assessment & Plan Note (Signed)
Mild bronchiectasis, does not appear to be progressive. New finding of pancreatic mass with intrahepatic biliary dilatation, he is awaiting follow-up with PCP and further workup of this

## 2022-12-07 ENCOUNTER — Other Ambulatory Visit: Payer: Self-pay | Admitting: Adult Health

## 2022-12-07 NOTE — Telephone Encounter (Signed)
Dr. Meridee Score plans to do ERCP +/- EUS next week - there will be other separate messages

## 2022-12-08 ENCOUNTER — Other Ambulatory Visit: Payer: Self-pay

## 2022-12-08 ENCOUNTER — Telehealth: Payer: Self-pay

## 2022-12-08 DIAGNOSIS — K8689 Other specified diseases of pancreas: Secondary | ICD-10-CM

## 2022-12-08 NOTE — Telephone Encounter (Signed)
Anti coag response sent to cardiology   Left message on machine to call back

## 2022-12-08 NOTE — Telephone Encounter (Signed)
EUS ERCP has been scheduled for 12/16/22 at 245 pm at [facility] with GM   Pt to hold coumadin 5 days prior

## 2022-12-08 NOTE — Telephone Encounter (Signed)
[facility] Pre-operative Risk Assessment     Request for surgical clearance:     Endoscopy Procedure  What type of surgery is being performed?     EUS ERCP   When is this surgery scheduled?     12/16/22  What type of clearance is required ?   Pharmacy  Are there any medications that need to be held prior to surgery and how long? Coumadin  Practice name and name of physician performing surgery?      [facility]  What is your office phone and fax number?      Phone- [phone]  Fax- [phone]  Anesthesia type (None, local, MAC, general) ?       MAC

## 2022-12-08 NOTE — Telephone Encounter (Signed)
----- Message from Marcus [name]., MD sent at 12/07/2022  4:47 PM EDT ----- Regarding: RE: pt with pancreatic mass. Let's plan for me to bring him in for ERCP +/- EUS on 6/13. Coumadin hold (5-days) will need approval. If I cannot access biliary tree then we'll admit for IR and possible liver biopy with PTBD. Thanks. GM  ----- Message ----- From: Marcus Boop, MD Sent: 12/07/2022  12:18 PM EDT To: Marcus Artist, MD; Marcus Baptist, PA-C; # Subject: RE: pt with pancreatic mass.                   He has had some sort of old-timey gastric stapling.  I had to dilate a gastric stenosis related to that in 2018 but was able to enter duodenum  I do not have any open spots  Am purple doc next week so could add him on if hospital allows  If they do liver bx and + then would not need EUS?  Marcus [name] ----- Message ----- From: Marcus [name]., MD Sent: 12/07/2022   8:33 AM EDT To: Marcus Boop, MD; Marcus Artist, MD; # Subject: RE: pt with pancreatic mass.                   CN, Thanks for reaching out. This is a patient of Marcus [name]'s so I put him on here. Looks like he has some obstruction as well based on recent LFTs. The possible gastric bypass could make things more difficult, but it looks like there was access to the duodenum so more so of a gastroplasty (CG do you recall this patient's anatomy?). If we can reach the duodenum we may be able to access the papilla to see about getting an ERCP completed. I'm just not totally sure about being able to access the neck of pancreas with the gastric anatomy for EUS because the gastroplasty will make apposition to that area of the [place] scope difficult.. We could try. I may have a cancellation or opening next Thursday for attempt at EUS/ERCP but if we cannot access things because of anatomy, then will need to be admitted for IR PTBD and IR Liver biopsy, so needs to be aware of this. If Marcus [name] has an earlier availability, then he can perform  ERCP for decompression attempt.  Marcus [name], Please offer this patient next Thursday EUS/ERCP (I still should have time and the other patients that I sent messages to you about, will need to be postponed for this possible maliganancy). If Marcus [name] has earlier ERCP appointment he can comment (I think he is back in town). Thanks. GM ----- Message ----- From: Marcus Frees, NP Sent: 12/07/2022   6:02 AM EDT To: Marcus Artist, MD; Malachy Mood, MD; # Subject: pt with pancreatic mass.                       Good morning,   The above patient was recently seen by unintentional weight loss. His CT scan showed    IMPRESSION: 1. Suspected pancreatic mass within the proximal body, not well-defined by CT. Potential mass measures approximately 2 cm. Relationship to the adjacent superior mesenteric artery and veins is difficult to define on the current exam, however there is likely mass effect on the superior aspect of the superior mesenteric vein. Recommend pancreatic protocol MRI for further evaluation, if pacemaker is MRI compatible. 2. Biliary and pancreatic ductal dilatation. 3. Multiple hypodense liver lesions, majority of which are  10 mm or less, however many are new from remote prior CT. Lesions are indeterminate, but given the are new, suspicious for metastatic disease in the setting. 4. Prior bariatric surgery, presumed gastric bypass. The excluded gastric remnant is distended with contrast consistent with gastro gastric fistula. When compared with 2018 upper GI, this is chronic. 5. Two cystic lesions within the pancreatic tail measuring 15 mm, nonspecific. 6. Layering stones or sludge in the gallbladder.  Most recent liver enzymes showed: Total Bili 4.3, Alk Phos 760, AST 177, ALT 272  He does have a pacemaker so is unable to have an MRI. He is not complaining of any pain currently but does have intense itching.   I have referred him to GI but do to urgent nature I did not want him to get  lost in the system and was also wondering on next steps... should I refer him to IR for tissue biopsy?    Thanks for everyone's help   [employer]

## 2022-12-09 ENCOUNTER — Encounter: Payer: Self-pay | Admitting: Family Medicine

## 2022-12-09 ENCOUNTER — Telehealth: Payer: Self-pay | Admitting: Internal Medicine

## 2022-12-09 ENCOUNTER — Ambulatory Visit: Payer: No Typology Code available for payment source | Attending: Cardiology | Admitting: *Deleted

## 2022-12-09 ENCOUNTER — Encounter (HOSPITAL_COMMUNITY): Payer: Self-pay | Admitting: Gastroenterology

## 2022-12-09 ENCOUNTER — Ambulatory Visit (INDEPENDENT_AMBULATORY_CARE_PROVIDER_SITE_OTHER): Payer: Medicare Other | Admitting: Family Medicine

## 2022-12-09 VITALS — BP 126/62 | HR 65 | Temp 98.5°F | Wt 219.2 lb

## 2022-12-09 DIAGNOSIS — R7401 Elevation of levels of liver transaminase levels: Secondary | ICD-10-CM

## 2022-12-09 DIAGNOSIS — I4891 Unspecified atrial fibrillation: Secondary | ICD-10-CM

## 2022-12-09 DIAGNOSIS — L299 Pruritus, unspecified: Secondary | ICD-10-CM | POA: Diagnosis not present

## 2022-12-09 DIAGNOSIS — Z7901 Long term (current) use of anticoagulants: Secondary | ICD-10-CM | POA: Diagnosis not present

## 2022-12-09 DIAGNOSIS — K8689 Other specified diseases of pancreas: Secondary | ICD-10-CM

## 2022-12-09 LAB — POCT INR: INR: 8 — AB (ref 2.0–3.0)

## 2022-12-09 LAB — PROTIME-INR
INR: >10 (ref 0.9–1.2)
Prothrombin Time: >120 s — ABNORMAL HIGH (ref 9.1–12.0)

## 2022-12-09 MED ORDER — PHYTONADIONE 5 MG PO TABS
ORAL_TABLET | ORAL | 0 refills | Status: DC
Start: 2022-12-09 — End: 2022-12-15

## 2022-12-09 MED ORDER — CHOLESTYRAMINE POWD
4.0000 g | Freq: Every day | 0 refills | Status: DC
Start: 2022-12-09 — End: 2022-12-10

## 2022-12-09 NOTE — Telephone Encounter (Signed)
Candace calling from [facility] wishing to speak with [name] in reference to results. Please advise [phone]

## 2022-12-09 NOTE — Patient Instructions (Addendum)
A prescription for cholestyramine powder was sent to your pharmacy.  You can take 4 g of this once a day to help with the itching.  It should not interfere with you having the procedure in the next week or so.  While taking the medication you may notice increased gas, constipation, or other GI symptoms.  If these are bothersome stop the medication.  Also try taking Allegra to help with symptoms.  This is an over-the-counter allergy medication.  The generic of this medication is called fexofenadine.  It can be found at your local drugstore.  The pharmacist can help you find it on the shelf.  If you develop fever or worsening symptoms proceed to the nearest emergency department.

## 2022-12-09 NOTE — Telephone Encounter (Signed)
Caller reporting critical results.

## 2022-12-09 NOTE — Patient Instructions (Signed)
Description   SPOKE WITH PT AND ADVISED PT TO TAKE VITAMIN K 5MG  TABLET NOW AND NOT TAKE ANY WARFARIN TODAY, NO WARFARIN TOMORROW, NO WARFARIN SATURDAY, NO WARFARIN SUNDAY. RECHECK INR ON MONDAY. REPORT TO ER WITH ANY BLEEDING, FALLS, ACCIDENTS.   NORMAL DOSE:warfarin 1 tablet daily EXCEPT 1/2 tablet on Sundays and Thursdays.  Stay consistent with greens each week (1-2 per week) Make Coumadin Clinic aware of changes in Wine intake Coumadin Clinic [phone]

## 2022-12-09 NOTE — Progress Notes (Signed)
Established Patient Office Visit   Subjective  Patient ID: Marcus [name], male    DOB: February 02, 1941  Age: 82 y.o. MRN: [id number]  Chief Complaint  Patient presents with   Medical Management of Chronic Issues    Still itching, states it is unbeleivable, can't sleep from itching. Has scars from scratching.wa given prednisone,6/3 but it is not working . The hydroxyzine did not even do anything for itching, so was prescribed prednisone.     Pt is an 82 yo male followed by [name], NP and seen for acute concern.  Pt seen by PCP for unintentional weight loss.  Labs revealed elevation in LFTs.  Imaging with pancreatic mass.   Pt to have EUS with GI on 12/16/2022.  Pt notes constant pruritus related to current symptoms.  Given hydroxyzine and prednisone for symptoms without relief.  Patient states that the itching is keeping him up at night and he has been unable to sleep.  Patient also endorses jaundice.  Coumadin stopped in preparation for upcoming GI procedure.  Had INR checked today.    Past Medical History:  Diagnosis Date   Acute meniscal tear of knee left   Arthritis    generalized   AV block 08/01/2018   Benign essential hypertension 01/18/2007   Bronchiectasis with acute exacerbation 06/23/2020   Chest pain, atypical 08/23/2014   Chronic cough 10/07/2014   Coronary artery disease    Degeneration of lumbar intervertebral disc 10/14/2017   Diverticulosis of colon 07/07/2005   Elevated PSA 06/19/2014   GERD (gastroesophageal reflux disease) 01/18/2007   Gout, unspecified 01/18/2007   Hemorrhoids 01/19/2011   Hiatal hernia    History of colonic polyps 01/18/2007   Insomnia, unspecified 08/21/2007   Iron deficiency anemia, unspecified  01/19/2011   Lumbar post-laminectomy syndrome 10/14/2017   Lumbar spondylolysis    Lumbar strain 12/14/2011   Obstructive sleep apnea 01/18/2007   uses CPAP nightly   Paraesophageal hernia    large   Primary osteoarthritis of knee  05/23/2015   Prostate cancer (HCC) 2016   S/P knee replacement 05/23/2015   Sensorineural hearing loss, bilateral    Vitamin B12 deficiency    Vitamin D deficiency 10/28/2009   Past Surgical History:  Procedure Laterality Date   BRONCHIAL WASHINGS  10/30/2019   Procedure: BRONCHIAL WASHINGS;  Surgeon: [name], DO;  Location: [facility];  Service: Endoscopy;;   CARDIAC CATHETERIZATION     CATARACT EXTRACTION W/ INTRAOCULAR LENS  IMPLANT, BILATERAL Bilateral    COLONOSCOPY     ESOPHAGOGASTRODUODENOSCOPY     KNEE ARTHROSCOPY Right 2007   KNEE ARTHROSCOPY  07/13/2011   Procedure: ARTHROSCOPY KNEE;  Surgeon: Erasmo Leventhal;  Location: [facility];  Service: Orthopedics;  Laterality: Left;  partial menisectomy with chondrylplasty   KNEE SURGERY Left    LAMINECTOMY AND MICRODISCECTOMY LUMBAR SPINE  MARCH  2008   L3 -  4   LAPAROSCOPIC INCISIONAL / UMBILICAL / VENTRAL HERNIA REPAIR  2006   PACEMAKER IMPLANT N/A 08/02/2018   Procedure: PACEMAKER IMPLANT;  Surgeon: Marinus Maw, MD;  Location: [facility];  Service: Cardiovascular;  Laterality: N/A;   PROSTATE BIOPSY     RADIOACTIVE SEED IMPLANT N/A 02/12/2019   Procedure: RADIOACTIVE SEED IMPLANT/BRACHYTHERAPY IMPLANT;  Surgeon: Marcine Matar, MD;  Location: [facility];  Service: Urology;  Laterality: N/A;  90 MINS   SHOULDER ARTHROSCOPY Right 05-23-2007   SIGMOID COLECTOMY FOR CANCER  1989   SPACE OAR INSTILLATION N/A 02/12/2019  Procedure: SPACE OAR INSTILLATION;  Surgeon: Marcine Matar, MD;  Location: [facility];  Service: Urology;  Laterality: N/A;   TOTAL KNEE ARTHROPLASTY Left 05/23/2015   Procedure: LEFT TOTAL KNEE ARTHROPLASTY;  Surgeon: Eugenia Mcalpine, MD;  Location: [facility];  Service: Orthopedics;  Laterality: Left;   VERTICAL BANDED GASTROPLASTY  1986   VIDEO BRONCHOSCOPY N/A 10/30/2019   Procedure: VIDEO BRONCHOSCOPY WITH BRONICAL ALVEROLAR LAVAGE WITHOUT FLUORO;  Surgeon: [name], DO;   Location: [facility];  Service: Endoscopy;  Laterality: N/A;   Social History   Tobacco Use   Smoking status: Former    Packs/day: 2.00    Years: 20.00    Additional pack years: 0.00    Total pack years: 40.00    Types: Cigarettes    Quit date: 07/05/1974    Years since quitting: 48.4   Smokeless tobacco: Never  Vaping Use   Vaping Use: Never used  Substance Use Topics   Alcohol use: Not Currently    Alcohol/week: 30.0 standard drinks of alcohol    Types: 30 Standard drinks or equivalent per week    Comment: 4-5 glasses of wine or 2-3 beers nightly   Drug use: No   Family History  Problem Relation Age of Onset   Stroke Paternal Grandfather    Heart disease Paternal Grandfather    Esophagitis Father        died from perforated esophagus   Breast cancer Mother    Colon cancer Maternal Grandmother        questionable   Esophageal cancer Neg Hx    Prostate cancer Neg Hx    Stomach cancer Neg Hx    Allergies  Allergen Reactions   Contrast Media [Iodinated Contrast Media] Palpitations    TACHYCARDIA- patient states he has tolerated newer agents since this reaction >30 yrs ago      ROS Negative unless stated above    Objective:     BP 126/62 (BP Location: Right Arm, Patient Position: Sitting, Cuff Size: Large)   Pulse 65   Temp 98.5 F (36.9 C) (Oral)   Wt 219 lb 3.2 oz (99.4 kg)   SpO2 96%   BMI 33.33 kg/m  BP Readings from Last 3 Encounters:  12/09/22 126/62  12/06/22 126/66  11/30/22 139/75   Wt Readings from Last 3 Encounters:  12/09/22 219 lb 3.2 oz (99.4 kg)  12/06/22 220 lb (99.8 kg)  11/30/22 223 lb 3.2 oz (101.2 kg)      Physical Exam Constitutional:      Appearance: Normal appearance.  HENT:     Head: Normocephalic and atraumatic.     Nose: Nose normal.     Mouth/Throat:     Mouth: Mucous membranes are moist.  Eyes:     General: Scleral icterus present.     Extraocular Movements: Extraocular movements intact.  Cardiovascular:      Rate and Rhythm: Normal rate.  Pulmonary:     Effort: Pulmonary effort is normal.     Breath sounds: Normal breath sounds.  Skin:    General: Skin is warm and dry.     Coloration: Skin is jaundiced.  Neurological:     Mental Status: He is alert and oriented to person, place, and time.      Results for orders placed or performed in visit on 12/09/22  Protime-INR  Result Value Ref Range   INR >10.0 (HH) 0.9 - 1.2   Prothrombin Time >120.0 (H) 9.1 - 12.0 sec  POCT INR  Result Value Ref Range   INR 8.0 (A) 2.0 - 3.0   POC INR        Assessment & Plan:  Pruritus -     Cholestyramine; 4 g by Does not apply route daily.  Dispense: 100 g; Refill: 0  Elevated transaminase level  Pancreatic mass  Atrial fibrillation, unspecified type (HCC) -Per chart review INR today 8.0.  Goal 2.0-3.0.  Patient advised to hold warfarin for the next 4 days and recheck INR on Monday 6/10. -Given strict precautions for bleeding  Current pruritus due to elevated LFTs and pancreatic mass.  Discussed r/b/a of remaining treatment options including cholestyramine.  This provider reached out to GI, Dr. Meridee Score, in regards to starting medication to ensure it would not interfere with upcoming EUS.  Will start cholestyramine 4 g daily.  Can also take Allegra.  If becomes febrile proceed to nearest ED.  Return if symptoms worsen or fail to improve.   Deeann Saint, MD

## 2022-12-09 NOTE — Telephone Encounter (Signed)
Left message on machine to call back  

## 2022-12-09 NOTE — Telephone Encounter (Signed)
ERCP scheduled, pt instructed and medications reviewed.  Patient instructions mailed to home.  Patient to call with any questions or concerns.   The pt states he spoke with cardiology and has a plan on holding her coumadin 5 days prior to his procedure.

## 2022-12-09 NOTE — Telephone Encounter (Signed)
Spoke with [employer] and confirmed that they did an extended INR and INR was 16.2 and that the computer system allows only greater than 10.0

## 2022-12-09 NOTE — Telephone Encounter (Signed)
Critical Lab call received in [facility] at [facility] [address].  Carman Ching of [facility] called to report a critical PT / INR: 16.2; 12/09/2022 at 105 pm.   Message will be sent to Coumadin clinic and Candance RN to report.

## 2022-12-10 ENCOUNTER — Telehealth: Payer: Self-pay | Admitting: Internal Medicine

## 2022-12-10 ENCOUNTER — Inpatient Hospital Stay (HOSPITAL_COMMUNITY)
Admission: EM | Admit: 2022-12-10 | Discharge: 2022-12-15 | DRG: 445 | Disposition: A | Discharge status: Home / Self Care | Payer: No Typology Code available for payment source | Attending: Internal Medicine | Admitting: Internal Medicine

## 2022-12-10 ENCOUNTER — Other Ambulatory Visit: Payer: Self-pay

## 2022-12-10 DIAGNOSIS — Z803 Family history of malignant neoplasm of breast: Secondary | ICD-10-CM

## 2022-12-10 DIAGNOSIS — C801 Malignant (primary) neoplasm, unspecified: Secondary | ICD-10-CM | POA: Diagnosis present

## 2022-12-10 DIAGNOSIS — E669 Obesity, unspecified: Secondary | ICD-10-CM | POA: Diagnosis present

## 2022-12-10 DIAGNOSIS — Z87891 Personal history of nicotine dependence: Secondary | ICD-10-CM

## 2022-12-10 DIAGNOSIS — R791 Abnormal coagulation profile: Secondary | ICD-10-CM | POA: Diagnosis not present

## 2022-12-10 DIAGNOSIS — K831 Obstruction of bile duct: Secondary | ICD-10-CM | POA: Diagnosis not present

## 2022-12-10 DIAGNOSIS — Z6832 Body mass index (BMI) 32.0-32.9, adult: Secondary | ICD-10-CM

## 2022-12-10 DIAGNOSIS — I5022 Chronic systolic (congestive) heart failure: Secondary | ICD-10-CM

## 2022-12-10 DIAGNOSIS — I482 Chronic atrial fibrillation, unspecified: Secondary | ICD-10-CM | POA: Diagnosis present

## 2022-12-10 DIAGNOSIS — L299 Pruritus, unspecified: Secondary | ICD-10-CM | POA: Diagnosis present

## 2022-12-10 DIAGNOSIS — I442 Atrioventricular block, complete: Secondary | ICD-10-CM | POA: Diagnosis present

## 2022-12-10 DIAGNOSIS — I4891 Unspecified atrial fibrillation: Secondary | ICD-10-CM | POA: Diagnosis present

## 2022-12-10 DIAGNOSIS — Z950 Presence of cardiac pacemaker: Secondary | ICD-10-CM

## 2022-12-10 DIAGNOSIS — Z7901 Long term (current) use of anticoagulants: Secondary | ICD-10-CM

## 2022-12-10 DIAGNOSIS — R978 Other abnormal tumor markers: Secondary | ICD-10-CM | POA: Diagnosis present

## 2022-12-10 DIAGNOSIS — K219 Gastro-esophageal reflux disease without esophagitis: Secondary | ICD-10-CM | POA: Diagnosis present

## 2022-12-10 DIAGNOSIS — K769 Liver disease, unspecified: Secondary | ICD-10-CM | POA: Diagnosis present

## 2022-12-10 DIAGNOSIS — E876 Hypokalemia: Secondary | ICD-10-CM | POA: Diagnosis not present

## 2022-12-10 DIAGNOSIS — Z9841 Cataract extraction status, right eye: Secondary | ICD-10-CM

## 2022-12-10 DIAGNOSIS — Z9884 Bariatric surgery status: Secondary | ICD-10-CM

## 2022-12-10 DIAGNOSIS — J479 Bronchiectasis, uncomplicated: Secondary | ICD-10-CM | POA: Diagnosis present

## 2022-12-10 DIAGNOSIS — K8689 Other specified diseases of pancreas: Secondary | ICD-10-CM | POA: Insufficient documentation

## 2022-12-10 DIAGNOSIS — Z923 Personal history of irradiation: Secondary | ICD-10-CM

## 2022-12-10 DIAGNOSIS — Z85038 Personal history of other malignant neoplasm of large intestine: Secondary | ICD-10-CM

## 2022-12-10 DIAGNOSIS — Z91041 Radiographic dye allergy status: Secondary | ICD-10-CM

## 2022-12-10 DIAGNOSIS — Z8546 Personal history of malignant neoplasm of prostate: Secondary | ICD-10-CM

## 2022-12-10 DIAGNOSIS — Z823 Family history of stroke: Secondary | ICD-10-CM

## 2022-12-10 DIAGNOSIS — G459 Transient cerebral ischemic attack, unspecified: Secondary | ICD-10-CM

## 2022-12-10 DIAGNOSIS — M1712 Unilateral primary osteoarthritis, left knee: Secondary | ICD-10-CM | POA: Diagnosis present

## 2022-12-10 DIAGNOSIS — I10 Essential (primary) hypertension: Secondary | ICD-10-CM | POA: Diagnosis not present

## 2022-12-10 DIAGNOSIS — I2510 Atherosclerotic heart disease of native coronary artery without angina pectoris: Secondary | ICD-10-CM | POA: Diagnosis present

## 2022-12-10 DIAGNOSIS — Z9049 Acquired absence of other specified parts of digestive tract: Secondary | ICD-10-CM

## 2022-12-10 DIAGNOSIS — E785 Hyperlipidemia, unspecified: Secondary | ICD-10-CM | POA: Diagnosis present

## 2022-12-10 DIAGNOSIS — R7989 Other specified abnormal findings of blood chemistry: Secondary | ICD-10-CM

## 2022-12-10 DIAGNOSIS — M1A9XX0 Chronic gout, unspecified, without tophus (tophi): Secondary | ICD-10-CM

## 2022-12-10 DIAGNOSIS — Z86010 Personal history of colonic polyps: Secondary | ICD-10-CM

## 2022-12-10 DIAGNOSIS — G4733 Obstructive sleep apnea (adult) (pediatric): Secondary | ICD-10-CM | POA: Diagnosis present

## 2022-12-10 DIAGNOSIS — Z96652 Presence of left artificial knee joint: Secondary | ICD-10-CM | POA: Diagnosis present

## 2022-12-10 DIAGNOSIS — H532 Diplopia: Secondary | ICD-10-CM | POA: Diagnosis not present

## 2022-12-10 DIAGNOSIS — E440 Moderate protein-calorie malnutrition: Secondary | ICD-10-CM | POA: Diagnosis present

## 2022-12-10 DIAGNOSIS — M109 Gout, unspecified: Secondary | ICD-10-CM | POA: Diagnosis present

## 2022-12-10 DIAGNOSIS — Z8249 Family history of ischemic heart disease and other diseases of the circulatory system: Secondary | ICD-10-CM

## 2022-12-10 DIAGNOSIS — Z961 Presence of intraocular lens: Secondary | ICD-10-CM | POA: Diagnosis present

## 2022-12-10 DIAGNOSIS — Z800 Family history of malignant neoplasm of digestive organs: Secondary | ICD-10-CM

## 2022-12-10 DIAGNOSIS — Z9842 Cataract extraction status, left eye: Secondary | ICD-10-CM

## 2022-12-10 LAB — PROTIME-INR
INR: 2.9 — ABNORMAL HIGH (ref 0.8–1.2)
Prothrombin Time: 30.3 seconds — ABNORMAL HIGH (ref 11.4–15.2)

## 2022-12-10 LAB — COMPREHENSIVE METABOLIC PANEL
ALT: 642 U/L — ABNORMAL HIGH (ref 0–44)
AST: 319 U/L — ABNORMAL HIGH (ref 15–41)
Albumin: 2.9 g/dL — ABNORMAL LOW (ref 3.5–5.0)
Alkaline Phosphatase: 682 U/L — ABNORMAL HIGH (ref 38–126)
Anion gap: 8 (ref 5–15)
BUN: 20 mg/dL (ref 8–23)
CO2: 25 mmol/L (ref 22–32)
Calcium: 9.2 mg/dL (ref 8.9–10.3)
Chloride: 106 mmol/L (ref 98–111)
Creatinine, Ser: 1.23 mg/dL (ref 0.61–1.24)
GFR, Estimated: 59 mL/min — ABNORMAL LOW (ref >60)
Glucose, Bld: 146 mg/dL — ABNORMAL HIGH (ref 70–99)
Potassium: 3.9 mmol/L (ref 3.5–5.1)
Sodium: 139 mmol/L (ref 135–145)
Total Bilirubin: 7.1 mg/dL — ABNORMAL HIGH (ref 0.3–1.2)
Total Protein: 6.3 g/dL — ABNORMAL LOW (ref 6.5–8.1)

## 2022-12-10 LAB — CBC
HCT: 37.3 % — ABNORMAL LOW (ref 39.0–52.0)
Hemoglobin: 12.1 g/dL — ABNORMAL LOW (ref 13.0–17.0)
MCH: 29.4 pg (ref 26.0–34.0)
MCHC: 32.4 g/dL (ref 30.0–36.0)
MCV: 90.8 fL (ref 80.0–100.0)
Platelets: 242 10*3/uL (ref 150–400)
RBC: 4.11 MIL/uL — ABNORMAL LOW (ref 4.22–5.81)
RDW: 17.6 % — ABNORMAL HIGH (ref 11.5–15.5)
WBC: 5.8 10*3/uL (ref 4.0–10.5)
nRBC: 0 % (ref 0.0–0.2)

## 2022-12-10 LAB — TYPE AND SCREEN
ABO/RH(D): A POS
Antibody Screen: NEGATIVE

## 2022-12-10 MED ORDER — HYDROCHLOROTHIAZIDE 25 MG PO TABS
25.0000 mg | ORAL_TABLET | Freq: Every day | ORAL | Status: DC
  Administered 2022-12-11 – 2022-12-12 (×2): 25 mg via ORAL
  Filled 2022-12-10 (×2): qty 1

## 2022-12-10 MED ORDER — ALLOPURINOL 300 MG PO TABS
300.0000 mg | ORAL_TABLET | Freq: Every day | ORAL | Status: DC
  Administered 2022-12-11 – 2022-12-15 (×5): 300 mg via ORAL
  Filled 2022-12-10 (×5): qty 1

## 2022-12-10 MED ORDER — SODIUM CHLORIDE 0.9% FLUSH
3.0000 mL | Freq: Two times a day (BID) | INTRAVENOUS | Status: DC
  Administered 2022-12-10 – 2022-12-15 (×9): 3 mL via INTRAVENOUS

## 2022-12-10 MED ORDER — HYDROXYZINE HCL 25 MG PO TABS
50.0000 mg | ORAL_TABLET | Freq: Two times a day (BID) | ORAL | Status: DC | PRN
  Administered 2022-12-10 – 2022-12-12 (×4): 50 mg via ORAL
  Filled 2022-12-10 (×4): qty 2

## 2022-12-10 MED ORDER — VALSARTAN-HYDROCHLOROTHIAZIDE 320-25 MG PO TABS: 1.0000 | ORAL_TABLET | Freq: Every day | ORAL | Status: DC

## 2022-12-10 MED ORDER — POLYETHYLENE GLYCOL 3350 17 G PO PACK: 17.0000 g | PACK | Freq: Every day | ORAL | Status: DC | PRN

## 2022-12-10 MED ORDER — AMLODIPINE BESYLATE 10 MG PO TABS
10.0000 mg | ORAL_TABLET | Freq: Every day | ORAL | Status: DC
  Administered 2022-12-11 – 2022-12-15 (×5): 10 mg via ORAL
  Filled 2022-12-10 (×5): qty 1

## 2022-12-10 MED ORDER — TAMSULOSIN HCL 0.4 MG PO CAPS
0.4000 mg | ORAL_CAPSULE | Freq: Every day | ORAL | Status: DC
  Administered 2022-12-11 – 2022-12-15 (×5): 0.4 mg via ORAL
  Filled 2022-12-10 (×5): qty 1

## 2022-12-10 MED ORDER — CHOLESTYRAMINE 4 G PO PACK
4.0000 g | PACK | Freq: Two times a day (BID) | ORAL | Status: DC
  Administered 2022-12-10 – 2022-12-13 (×7): 4 g via ORAL
  Filled 2022-12-10 (×10): qty 1

## 2022-12-10 MED ORDER — IRBESARTAN 300 MG PO TABS
300.0000 mg | ORAL_TABLET | Freq: Every day | ORAL | Status: DC
  Administered 2022-12-11 – 2022-12-14 (×4): 300 mg via ORAL
  Filled 2022-12-10 (×4): qty 1

## 2022-12-10 MED ORDER — DIPHENHYDRAMINE HCL 50 MG/ML IJ SOLN
25.0000 mg | Freq: Once | INTRAMUSCULAR | Status: AC
  Administered 2022-12-10: 25 mg via INTRAVENOUS
  Filled 2022-12-10: qty 1

## 2022-12-10 MED ORDER — ACETAMINOPHEN 650 MG RE SUPP: 650.0000 mg | Freq: Four times a day (QID) | RECTAL | Status: DC | PRN

## 2022-12-10 MED ORDER — ACETAMINOPHEN 325 MG PO TABS
650.0000 mg | ORAL_TABLET | Freq: Four times a day (QID) | ORAL | Status: DC | PRN
  Filled 2022-12-10: qty 2

## 2022-12-10 NOTE — Telephone Encounter (Signed)
I will send a message to the requesting office the pt is going to need an IN OFFICE appt per pre op APP. See notes from Jari Favre, [facility] about INR was 16 recently.

## 2022-12-10 NOTE — H&P (Signed)
History and Physical   Marcus [name]:[id number] DOB: 08/06/1940 DOA: 12/10/2022  PCP: [name], NP   Patient coming from: Home  Chief Complaint: Lab abnormalities, pancreatic mass  HPI: Marcus [name] is a 82 y.o. male with medical history significant of atrial fibrillation, hypertension, CAD, pacemaker, GERD, gout, obesity, OSA, bronchiectasis, prostate cancer, bariatric surgery presenting with abnormal labs and pancreatic mass.  Patient underwent workup by PCP for ongoing weight loss and elevated LFTs.  CT performed on 5/21 showed pancreatic mass approximately 2 cm, biliary and pancreatic ductal dilation, multiple liver lesions, status post bariatric surgery.  Patient was given referral to GI to hopefully get ERCP done.  This has been delayed by increasing INR due to patient being on warfarin for A-fib.  Yesterday his INR was greater than 10 when checked and he was prescribed vitamin K and his warfarin was held.  Due to this he was also sent to the ED for further evaluation as his ERCP would need to be done inpatient either way.  He is also noticed some increasing pruritus recently.  He denies fevers, chills, chest pain, shortness of breath, abdominal pain, constipation, diarrhea, nausea, vomiting.  ED Course: Vital signs in the ED notable for blood pressure in the 120s to 140 systolic, heart rate in the 50s to 60s.  Lab workup included CMP with glucose 146, protein 6.3, albumin 2.9, AST 319, ALT 642, alk phos 687, T. bili 7.1 up from 4.31-day ago.  CBC with hemoglobin stable at 12.1.  PT and INR elevated at 30.3 and 2.9 respectively, INR 2.9 is significantly improved from greater than 10 yesterday.  Patient typed and screened in the ED.  He also received a dose of Benadryl.  GI was consulted/aware of his presentation and plan to see patient tomorrow and plan for ERCP in the coming days once INR is acceptable.  Review of Systems: As per HPI otherwise all other systems reviewed and are  negative.  Past Medical History:  Diagnosis Date   Acute meniscal tear of knee left   Arthritis    generalized   AV block 08/01/2018   Benign essential hypertension 01/18/2007   Bronchiectasis with acute exacerbation 06/23/2020   Chest pain, atypical 08/23/2014   Chronic cough 10/07/2014   Coronary artery disease    Degeneration of lumbar intervertebral disc 10/14/2017   Diverticulosis of colon 07/07/2005   Elevated PSA 06/19/2014   GERD (gastroesophageal reflux disease) 01/18/2007   Gout, unspecified 01/18/2007   Hemorrhoids 01/19/2011   Hiatal hernia    History of colonic polyps 01/18/2007   Insomnia, unspecified 08/21/2007   Iron deficiency anemia, unspecified  01/19/2011   Lumbar post-laminectomy syndrome 10/14/2017   Lumbar spondylolysis    Lumbar strain 12/14/2011   Obstructive sleep apnea 01/18/2007   uses CPAP nightly   Paraesophageal hernia    large   Primary osteoarthritis of knee 05/23/2015   Prostate cancer (HCC) 2016   S/P knee replacement 05/23/2015   Sensorineural hearing loss, bilateral    Vitamin B12 deficiency    Vitamin D deficiency 10/28/2009    Past Surgical History:  Procedure Laterality Date   BRONCHIAL WASHINGS  10/30/2019   Procedure: BRONCHIAL WASHINGS;  Surgeon: [name], DO;  Location: [facility];  Service: Endoscopy;;   CARDIAC CATHETERIZATION     CATARACT EXTRACTION W/ INTRAOCULAR LENS  IMPLANT, BILATERAL Bilateral    COLONOSCOPY     ESOPHAGOGASTRODUODENOSCOPY     KNEE ARTHROSCOPY Right 2007   KNEE ARTHROSCOPY  07/13/2011   Procedure: ARTHROSCOPY KNEE;  Surgeon: Erasmo Leventhal;  Location: [facility];  Service: Orthopedics;  Laterality: Left;  partial menisectomy with chondrylplasty   KNEE SURGERY Left    LAMINECTOMY AND MICRODISCECTOMY LUMBAR SPINE  MARCH  2008   L3 -  4   LAPAROSCOPIC INCISIONAL / UMBILICAL / VENTRAL HERNIA REPAIR  2006   PACEMAKER IMPLANT N/A 08/02/2018   Procedure: PACEMAKER IMPLANT;   Surgeon: Marinus Maw, MD;  Location: [facility];  Service: Cardiovascular;  Laterality: N/A;   PROSTATE BIOPSY     RADIOACTIVE SEED IMPLANT N/A 02/12/2019   Procedure: RADIOACTIVE SEED IMPLANT/BRACHYTHERAPY IMPLANT;  Surgeon: Marcine Matar, MD;  Location: [facility];  Service: Urology;  Laterality: N/A;  90 MINS   SHOULDER ARTHROSCOPY Right 05-23-2007   SIGMOID COLECTOMY FOR CANCER  1989   SPACE OAR INSTILLATION N/A 02/12/2019   Procedure: SPACE OAR INSTILLATION;  Surgeon: Marcine Matar, MD;  Location: [facility];  Service: Urology;  Laterality: N/A;   TOTAL KNEE ARTHROPLASTY Left 05/23/2015   Procedure: LEFT TOTAL KNEE ARTHROPLASTY;  Surgeon: Eugenia Mcalpine, MD;  Location: [facility];  Service: Orthopedics;  Laterality: Left;   VERTICAL BANDED GASTROPLASTY  1986   VIDEO BRONCHOSCOPY N/A 10/30/2019   Procedure: VIDEO BRONCHOSCOPY WITH BRONICAL ALVEROLAR LAVAGE WITHOUT FLUORO;  Surgeon: [name], DO;  Location: [facility];  Service: Endoscopy;  Laterality: N/A;    Social History  reports that he quit smoking about 48 years ago. His smoking use included cigarettes. He has a 40.00 pack-year smoking history. He has never used smokeless tobacco. He reports that he does not currently use alcohol after a past usage of about 30.0 standard drinks of alcohol per week. He reports that he does not use drugs.  Allergies  Allergen Reactions   Contrast Media [Iodinated Contrast Media] Palpitations    TACHYCARDIA- patient states he has tolerated newer agents since this reaction >30 yrs ago    Family History  Problem Relation Age of Onset   Stroke Paternal Grandfather    Heart disease Paternal Grandfather    Esophagitis Father        died from perforated esophagus   Breast cancer Mother    Colon cancer Maternal Grandmother        questionable   Esophageal cancer Neg Hx    Prostate cancer Neg Hx    Stomach cancer Neg Hx   Reviewed on admission  Prior to Admission medications    Medication Sig Start Date End Date Taking? Authorizing Provider  allopurinol (ZYLOPRIM) 300 MG tablet Take 1 tablet (300 mg total) by mouth daily. 05/25/22   Nafziger, Kandee Keen, NP  amLODipine (NORVASC) 10 MG tablet Take 1 tablet (10 mg total) by mouth daily. 05/25/22   Nafziger, Kandee Keen, NP  Calcium Carb-Cholecalciferol (CALCIUM 600 + D PO) Take 1 tablet by mouth daily. 800 units of vitamin D    [provider]  Cholestyramine POWD 4 g by Does not apply route daily. 12/09/22   Deeann Saint, MD  hydrOXYzine (ATARAX) 50 MG tablet Take 1 tablet (50 mg total) by mouth 3 (three) times daily as needed for itching. 11/30/22   Ardith Dark, MD  ipratropium (ATROVENT) 0.03 % nasal spray Place 2 sprays into both nostrils every 12 (twelve) hours. Patient not taking: Reported on 12/09/2022 05/03/22   [name], NP  Multiple Vitamin (MULTIVITAMIN) tablet Take 1 tablet by mouth in the morning.    [provider]  phytonadione (VITAMIN K) 5 MG tablet Take 1 tablet by mouth NOW. 12/09/22   Marinus Maw, MD  predniSONE (DELTASONE) 10 MG tablet Take 1 tablet (10 mg total) by mouth daily with breakfast. Patient not taking: Reported on 12/09/2022 12/06/22   [name], NP  Respiratory Therapy Supplies (FLUTTER) DEVI 1 each by Does not apply route in the morning and at bedtime. 10/11/19   Karie Fetch P, DO  sodium chloride HYPERTONIC 3 % nebulizer solution Take by nebulization as needed for other. 04/01/22   [name], NP  tamsulosin (FLOMAX) 0.4 MG CAPS capsule Take 0.4 mg by mouth daily. 03/04/16   [provider]  valsartan-hydrochlorothiazide (DIOVAN-HCT) 320-25 MG tablet TAKE 1 TABLET BY MOUTH DAILY. 05/25/22   Nafziger, Kandee Keen, NP  warfarin (COUMADIN) 5 MG tablet Take 1 tablet (5 mg total) by mouth daily. Patient not taking: Reported on 12/09/2022 10/05/22   Marinus Maw, MD    Physical Exam: Vitals:   12/10/22 1442 12/10/22 1630  BP: 126/72 (!) 141/75  Pulse: 60 (!) 59   Resp: 16 19  Temp: 98 F (36.7 C) (!) 97.5 F (36.4 C)  TempSrc:  Oral  SpO2: 98% 100%    Physical Exam Constitutional:      General: He is not in acute distress.    Appearance: Normal appearance. He is obese.  HENT:     Head: Normocephalic and atraumatic.     Mouth/Throat:     Mouth: Mucous membranes are moist.     Pharynx: Oropharynx is clear.  Eyes:     Extraocular Movements: Extraocular movements intact.     Pupils: Pupils are equal, round, and reactive to light.  Cardiovascular:     Rate and Rhythm: Normal rate and regular rhythm.     Pulses: Normal pulses.     Heart sounds: Normal heart sounds.  Pulmonary:     Effort: Pulmonary effort is normal. No respiratory distress.     Breath sounds: Normal breath sounds.  Abdominal:     General: Bowel sounds are normal. There is no distension.     Palpations: Abdomen is soft.     Tenderness: There is no abdominal tenderness.  Musculoskeletal:        General: No swelling or deformity.  Skin:    General: Skin is warm and dry.  Neurological:     General: No focal deficit present.     Mental Status: Mental status is at baseline.    Labs on Admission: I have personally reviewed following labs and imaging studies  CBC: Recent Labs  Lab 12/10/22 1444  WBC 5.8  HGB 12.1*  HCT 37.3*  MCV 90.8  PLT 242    Basic Metabolic Panel: Recent Labs  Lab 12/10/22 1444  NA 139  K 3.9  CL 106  CO2 25  GLUCOSE 146*  BUN 20  CREATININE 1.23  CALCIUM 9.2    GFR: Estimated Creatinine Clearance: 52.9 mL/min (by C-G formula based on SCr of 1.23 mg/dL).  Liver Function Tests: Recent Labs  Lab 12/10/22 1444  AST 319*  ALT 642*  ALKPHOS 682*  BILITOT 7.1*  PROT 6.3*  ALBUMIN 2.9*    Urine analysis:    Component Value Date/Time   COLORURINE YELLOW 08/02/2020 1614   APPEARANCEUR CLEAR 08/02/2020 1614   LABSPEC 1.019 08/02/2020 1614   PHURINE 5.0 08/02/2020 1614   GLUCOSEU NEGATIVE 08/02/2020 1614   HGBUR NEGATIVE  08/02/2020 1614   BILIRUBINUR NEGATIVE 08/02/2020 1614   BILIRUBINUR  n 10/10/2015 0919   KETONESUR NEGATIVE 08/02/2020 1614   PROTEINUR NEGATIVE 08/02/2020 1614   UROBILINOGEN 0.2 10/10/2015 0919   UROBILINOGEN 0.2 05/13/2015 0922   NITRITE NEGATIVE 08/02/2020 1614   LEUKOCYTESUR NEGATIVE 08/02/2020 1614    Radiological Exams on Admission: No results found.  EKG: Not performed in the emergency department  Assessment/Plan Principal Problem:   Biliary obstruction due to malignant neoplasm [facility]) Active Problems:   Gout, unspecified   Obesity   Obstructive sleep apnea   Benign essential hypertension   GERD (gastroesophageal reflux disease)   Pacemaker   Coronary artery disease   Bronchiectasis without complication (HCC)   Atrial fibrillation (HCC)   Pancreatic mass   Status post bariatric surgery   Biliary obstruction Pancreatic mass Liver lesions LFT elevation > Patient with known pancreatic mass currently undergoing outpatient workup presenting after increasing LFTs and elevated INR noted on recent labs.  > INR as high as greater than 10 yesterday improved down to 2.9.  LFTs significantly increased in the 100s cholestatic pattern and T. bili now 7.1 from 4.3 yesterday. > Was being worked up for weight loss and lab abnormalities outpatient where pancreatic mass and liver lesions was discovered on CT with GI referral for ERCP now needing to be done inpatient considering increased INR as well as atypical anatomy from prior bariatric surgery.   > Of note he has had endoscopy performed previously where they were able to reach the duodenum. > GI aware of admission and case was discussed with them by EDP - Monitor on telemetry overnight - Appreciate GI recommendations and assistance - Trend INR and LFTs - Continue to hold warfarin - Plan for ERCP when able, n.p.o. at midnight in case INR improved tomorrow and they are able to perform procedure.  It does look like it will most  likely be Sunday or Monday however.  Atrial fibrillation - Not currently on any rate or rhythm control medications - Holding home warfarin as above  Hypertension - Continue home amlodipine and losartan-hydrochlorothiazide  Gout - Continue home allopurinol  CAD - Continue home valsartan as above - Holding warfarin as above  Bronchiectasis - Not currently on any inhalers  Status post bariatric surgery - Noted  Complete heart block > Status post pacemaker  History of pancreatic cancer - Noted  OSA - Continue home CPAP  Obesity - Noted  DVT prophylaxis: Warfarin, currently on hold for supratherapeutic INR Code Status:   Full Family Communication:  Updated at bedside Disposition Plan:   Patient is from:  Home  Anticipated DC to:  Home  Anticipated DC date:  1 to 3 days  Anticipated DC barriers: None  Consults called:  Gastroenterology Admission status:  Observation, telemetry  Severity of Illness: The appropriate patient status for this patient is OBSERVATION. Observation status is judged to be reasonable and necessary in order to provide the required intensity of service to ensure the patient's safety. The patient's presenting symptoms, physical exam findings, and initial radiographic and laboratory data in the context of their medical condition is felt to place them at decreased risk for further clinical deterioration. Furthermore, it is anticipated that the patient will be medically stable for discharge from the hospital within 2 midnights of admission.    Synetta Fail MD [facility]  How to contact the [facility] Attending or Consulting provider 7A - 7P or covering provider during after hours 7P -7A, for this patient?   Check the care team in [facility] and look for a)  attending/consulting [facility] provider listed and b) the [facility] team listed Log into [email or web address] and use [place]'s universal password to access. If you do not have the password, please contact the  hospital operator. Locate the [facility] provider you are looking for under [facility] and page to a number that you can be directly reached. If you still have difficulty reaching the provider, please page the [facility] (Director on Call) for the Hospitalists listed on amion for assistance.  12/10/2022, 4:57 PM

## 2022-12-10 NOTE — Telephone Encounter (Signed)
I s/w the pt and have added him to the DOD schedule for 12/13/22 with Dr. Jacques Navy. Pt will see coumadin clinic 6/10 at 10:30 and will come back to see DOD. Please see notes from pre op APP Jari Favre, PAC. I will update all parties involved.

## 2022-12-10 NOTE — Telephone Encounter (Signed)
[name], Let's try to move up another person who needs EUS/ERCP or ERCP solely to that date now that we have removed him from this date. Thanks. GM

## 2022-12-10 NOTE — Telephone Encounter (Signed)
   Name: Marcus [name]  DOB: May 28, 1941  MRN: [id number]  Primary Cardiologist: None  Chart reviewed as part of pre-operative protocol coverage. Because of Le Faulcon Deady's past medical history and time since last visit, he will require a follow-up in-office visit in order to better assess preoperative cardiovascular risk.  Pre-op covering staff: - Please schedule appointment and call patient to inform them. If patient already had an upcoming appointment within acceptable timeframe, please add "pre-op clearance" to the appointment notes so provider is aware. - Please contact requesting surgeon's office via preferred method (i.e, phone, fax) to inform them of need for appointment prior to surgery.   Per office protocol, patient can hold warfarin for 5 days prior to procedure.   Patient will not need bridging with Lovenox (enoxaparin) around procedure.   (Warfarin currently on hold for INR 12/09/22 of > 10).  Coumadin Clinic advised of date change  Sharlene Dory, Cordelia Poche  12/10/2022, 7:56 AM

## 2022-12-10 NOTE — ED Notes (Signed)
ED TO INPATIENT HANDOFF REPORT  ED Nurse Name and Phone #: [place] Lat[place]ippe/ [phone]  S Name/Age/Gender Marcus [name] 82 y.o. male Room/Bed: 022C/022C  Code Status   Code Status: Full Code  Home/SNF/Other Home Patient oriented to: self, place, time, and situation Is this baseline? 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 Has upcoming tests to investigate masses on liver and pancreas but was contacted to say they cannot move forward with procedures until INR is within range.    Allergies Allergies  Allergen Reactions   Contrast Media [Iodinated Contrast Media] Palpitations    TACHYCARDIA- patient states he has tolerated newer agents since this reaction >30 yrs ago    Level of Care/Admitting Diagnosis ED Disposition     ED Disposition  Admit   Condition  --   Comment  Hospital Area: [facility] [[id number]]  Level of Care: Telemetry Medical [104]  May place patient in observation at [facility] or [facility] if equivalent level of care is available:: No  Covid Evaluation: Asymptomatic - no recent exposure (last 10 days) testing not required  Diagnosis: Biliary obstruction due to malignant neoplasm [facility]) [[id number]]  Admitting Physician: Synetta Fail [[id number]]  Attending Physician: Synetta Fail [phone]          B Medical/Surgery History Past Medical History:  Diagnosis Date   Acute meniscal tear of knee left   Arthritis    generalized   AV block 08/01/2018   Benign essential hypertension 01/18/2007   Bronchiectasis with acute exacerbation 06/23/2020   Chest pain, atypical 08/23/2014   Chronic cough 10/07/2014   Coronary artery disease    Degeneration of lumbar intervertebral disc 10/14/2017   Diverticulosis of colon 07/07/2005   Elevated PSA 06/19/2014   GERD (gastroesophageal reflux disease)  01/18/2007   Gout, unspecified 01/18/2007   Hemorrhoids 01/19/2011   Hiatal hernia    History of colonic polyps 01/18/2007   Insomnia, unspecified 08/21/2007   Iron deficiency anemia, unspecified  01/19/2011   Lumbar post-laminectomy syndrome 10/14/2017   Lumbar spondylolysis    Lumbar strain 12/14/2011   Obstructive sleep apnea 01/18/2007   uses CPAP nightly   Paraesophageal hernia    large   Primary osteoarthritis of knee 05/23/2015   Prostate cancer (HCC) 2016   S/P knee replacement 05/23/2015   Sensorineural hearing loss, bilateral    Vitamin B12 deficiency    Vitamin D deficiency 10/28/2009   Past Surgical History:  Procedure Laterality Date   BRONCHIAL WASHINGS  10/30/2019   Procedure: BRONCHIAL WASHINGS;  Surgeon: [name], DO;  Location: [facility];  Service: Endoscopy;;   CARDIAC CATHETERIZATION     CATARACT EXTRACTION W/ INTRAOCULAR LENS  IMPLANT, BILATERAL Bilateral    COLONOSCOPY     ESOPHAGOGASTRODUODENOSCOPY     KNEE ARTHROSCOPY Right 2007   KNEE ARTHROSCOPY  07/13/2011   Procedure: ARTHROSCOPY KNEE;  Surgeon: Erasmo Leventhal;  Location: [facility];  Service: Orthopedics;  Laterality: Left;  partial menisectomy with chondrylplasty   KNEE SURGERY Left    LAMINECTOMY AND MICRODISCECTOMY LUMBAR SPINE  MARCH  2008   L3 -  4   LAPAROSCOPIC INCISIONAL / UMBILICAL / VENTRAL HERNIA REPAIR  2006   PACEMAKER IMPLANT N/A 08/02/2018   Procedure: PACEMAKER IMPLANT;  Surgeon: Marinus Maw, MD;  Location: [facility];  Service: Cardiovascular;  Laterality: N/A;   PROSTATE BIOPSY     RADIOACTIVE SEED IMPLANT N/A 02/12/2019   Procedure: RADIOACTIVE SEED IMPLANT/BRACHYTHERAPY IMPLANT;  Surgeon: Marcine Matar, MD;  Location: [facility];  Service: Urology;  Laterality: N/A;  90 MINS   SHOULDER ARTHROSCOPY Right 05-23-2007   SIGMOID COLECTOMY FOR CANCER  1989   SPACE OAR INSTILLATION N/A 02/12/2019   Procedure: SPACE OAR INSTILLATION;  Surgeon:  Marcine Matar, MD;  Location: [facility];  Service: Urology;  Laterality: N/A;   TOTAL KNEE ARTHROPLASTY Left 05/23/2015   Procedure: LEFT TOTAL KNEE ARTHROPLASTY;  Surgeon: Eugenia Mcalpine, MD;  Location: [facility];  Service: Orthopedics;  Laterality: Left;   VERTICAL BANDED GASTROPLASTY  1986   VIDEO BRONCHOSCOPY N/A 10/30/2019   Procedure: VIDEO BRONCHOSCOPY WITH BRONICAL ALVEROLAR LAVAGE WITHOUT FLUORO;  Surgeon: [name], DO;  Location: [facility];  Service: Endoscopy;  Laterality: N/A;     A IV Location/Drains/Wounds Patient Lines/Drains/Airways Status     Active Line/Drains/Airways     Name Placement date Placement time Site Days   Peripheral IV 12/10/22 20 G Anterior;Right Forearm 12/10/22  1650  Forearm  less than 1   Airway 8 mm 10/30/19  1558  -- 1137   Wound / Incision (Open or Dehisced) 10/12/13 Laceration Finger (Comment which one) Left 5th digit laceration. bleeding controlled. neuro intact 10/12/13  1123  Finger (Comment which one)  3346            Intake/Output Last 24 hours No intake or output data in the 24 hours ending 12/10/22 1714  Labs/Imaging Results for orders placed or performed during the hospital encounter of 12/10/22 (from the past 48 hour(s))  Comprehensive metabolic panel     Status: Abnormal   Collection Time: 12/10/22  2:44 PM  Result Value Ref Range   Sodium 139 135 - 145 mmol/L   Potassium 3.9 3.5 - 5.1 mmol/L   Chloride 106 98 - 111 mmol/L   CO2 25 22 - 32 mmol/L   Glucose, Bld 146 (H) 70 - 99 mg/dL    Comment: Glucose reference range applies only to samples taken after fasting for at least 8 hours.   BUN 20 8 - 23 mg/dL   Creatinine, Ser 4.25 0.61 - 1.24 mg/dL   Calcium 9.2 8.9 - 95.6 mg/dL   Total Protein 6.3 (L) 6.5 - 8.1 g/dL   Albumin 2.9 (L) 3.5 - 5.0 g/dL   AST 387 (H) 15 - 41 U/L   ALT 642 (H) 0 - 44 U/L   Alkaline Phosphatase 682 (H) 38 - 126 U/L   Total Bilirubin 7.1 (H) 0.3 - 1.2 mg/dL   GFR, Estimated 59 (L) >60  mL/min    Comment: (NOTE) Calculated using the CKD-EPI Creatinine Equation (2021)    Anion gap 8 5 - 15    Comment: Performed at [facility], [address], [place], [place] [address]  CBC     Status: Abnormal   Collection Time: 12/10/22  2:44 PM  Result Value Ref Range   WBC 5.8 4.0 - 10.5 K/uL   RBC 4.11 (L) 4.22 - 5.81 MIL/uL   Hemoglobin 12.1 (L) 13.0 - 17.0 g/dL   HCT 29.5 (L) 18.8 - 41.6 %   MCV 90.8 80.0 - 100.0 fL   MCH 29.4 26.0 - 34.0 pg   MCHC 32.4 30.0 - 36.0 g/dL   RDW 60.6 (H) 30.1 - 60.1 %   Platelets 242 150 - 400 K/uL   nRBC 0.0  0.0 - 0.2 %    Comment: Performed at [facility], [address] [place], [place] [address]  Protime-INR - (order if Patient is taking Coumadin / Warfarin)     Status: Abnormal   Collection Time: 12/10/22  2:44 PM  Result Value Ref Range   Prothrombin Time 30.3 (H) 11.4 - 15.2 seconds   INR 2.9 (H) 0.8 - 1.2    Comment: (NOTE) INR goal varies based on device and disease states. Performed at [facility], [address] [place], [place] [address]   Type and screen [facility]     Status: None   Collection Time: 12/10/22  2:50 PM  Result Value Ref Range   ABO/RH(D) A POS    Antibody Screen NEG    Sample Expiration      12/13/2022,2359 Performed at [facility], [address], [place], [place] [address]    No results found.  Pending Labs Unresulted Labs (From admission, onward)     Start     Ordered   12/11/22 0500  Comprehensive metabolic panel  Tomorrow morning,   R        12/10/22 1656   12/11/22 0500  CBC  Tomorrow morning,   R        12/10/22 1656   12/11/22 0500  Protime-INR  Tomorrow morning,   R        12/10/22 1656            Vitals/Pain Today's Vitals   12/10/22 1442 12/10/22 1443 12/10/22 1630  BP: 126/72  (!) 141/75  Pulse: 60  (!) 59  Resp: 16  19  Temp: 98 F (36.7 C)  (!) 97.5 F (36.4 C)  TempSrc:   Oral  SpO2: 98%  100%  PainSc:  0-No pain      Isolation Precautions No active isolations  Medications Medications  allopurinol (ZYLOPRIM) tablet 300 mg (has no administration in time range)  amLODipine (NORVASC) tablet 10 mg (has no administration in time range)  tamsulosin (FLOMAX) capsule 0.4 mg (has no administration in time range)  sodium chloride flush (NS) 0.9 % injection 3 mL (has no administration in time range)  acetaminophen (TYLENOL) tablet 650 mg (has no administration in time range)    Or  acetaminophen (TYLENOL) suppository 650 mg (has no administration in time range)  polyethylene glycol (MIRALAX / GLYCOLAX) packet 17 g (has no administration in time range)  irbesartan (AVAPRO) tablet 300 mg (has no administration in time range)    And  hydrochlorothiazide (HYDRODIURIL) tablet 25 mg (has no administration in time range)  diphenhydrAMINE (BENADRYL) injection 25 mg (25 mg Intravenous Given 12/10/22 1711)    Mobility walks with person assist     Focused Assessments     R Recommendations: See Admitting Provider Note  Report given to:   Additional Notes: Pt came in with an abnormal INR and it's elevated at 2.9. His hgb and hct are also decreased. He's A&Ox 4 and ambulatory with assistance. He's only gotten benadryl for the itching. He  has a 20 G in the R FA. Daughter is at bedside.

## 2022-12-10 NOTE — Telephone Encounter (Signed)
Dr Leone Payor I spoke to the pt  and made him aware of the recommendations to go to the ER now or in the morning if he is doing ok.  He tells me that he has not slept in 3 days due to the itching and he is going to go today to [facility].

## 2022-12-10 NOTE — Telephone Encounter (Signed)
Dr Meridee Score please see notes regarding coumadin.  The pt will have to be rescheduled.  Ok to move to next available?

## 2022-12-10 NOTE — Telephone Encounter (Signed)
I have cancelled the case for now and made the pt aware. He will await a call from Dr Leone Payor in regards to next steps.

## 2022-12-10 NOTE — ED Triage Notes (Signed)
Patient sent from PCP for eval of INR >10. Has upcoming tests to investigate masses on liver and pancreas but was contacted to say they cannot move forward with procedures until INR is within range.

## 2022-12-10 NOTE — ED Provider Notes (Signed)
[place] EMERGENCY DEPARTMENT AT [facility] Provider Note   CSN: [id number] Arrival date & time: 12/10/22  1352     History  Chief Complaint  Patient presents with   Abnormal Lab    Marcus [name] is a 82 y.o. male history of pancreatic mass, here presenting with worsening liver function and elevated INR.  Patient states that he has been losing weight and has followed up with primary care doctor.  He had recent CT abdomen pelvis that showed new pancreatic mass.  Patient is on Coumadin for A-fib and his INR has been progressively more elevated.  Yesterday the INR was 10.  He was prescribed a dose of vitamin K and took it last night.  Patient states that he has not been taking his Coumadin since yesterday.  He states that a week ago his INR was also elevated and he was told to take half the dose.  Patient has been followed with GI for ERCP.  He is ERCP was originally scheduled for next week and GI sent him here for further evaluation and ERCP inpatient.  Patient states that he has worsening itchiness.  He also has easy bruising and bleeding  The history is provided by the patient.       Home Medications Prior to Admission medications   Medication Sig Start Date End Date Taking? Authorizing Provider  allopurinol (ZYLOPRIM) 300 MG tablet Take 1 tablet (300 mg total) by mouth daily. 05/25/22   Nafziger, Kandee Keen, NP  amLODipine (NORVASC) 10 MG tablet Take 1 tablet (10 mg total) by mouth daily. 05/25/22   Nafziger, Kandee Keen, NP  Calcium Carb-Cholecalciferol (CALCIUM 600 + D PO) Take 1 tablet by mouth daily. 800 units of vitamin D    [provider]  Cholestyramine POWD 4 g by Does not apply route daily. 12/09/22   Deeann Saint, MD  hydrOXYzine (ATARAX) 50 MG tablet Take 1 tablet (50 mg total) by mouth 3 (three) times daily as needed for itching. 11/30/22   Ardith Dark, MD  ipratropium (ATROVENT) 0.03 % nasal spray Place 2 sprays into both nostrils every 12 (twelve)  hours. Patient not taking: Reported on 12/09/2022 05/03/22   [name], NP  Multiple Vitamin (MULTIVITAMIN) tablet Take 1 tablet by mouth in the morning.    [provider]  phytonadione (VITAMIN K) 5 MG tablet Take 1 tablet by mouth NOW. 12/09/22   Marinus Maw, MD  predniSONE (DELTASONE) 10 MG tablet Take 1 tablet (10 mg total) by mouth daily with breakfast. Patient not taking: Reported on 12/09/2022 12/06/22   [name], NP  Respiratory Therapy Supplies (FLUTTER) DEVI 1 each by Does not apply route in the morning and at bedtime. 10/11/19   Karie Fetch P, DO  sodium chloride HYPERTONIC 3 % nebulizer solution Take by nebulization as needed for other. 04/01/22   [name], NP  tamsulosin (FLOMAX) 0.4 MG CAPS capsule Take 0.4 mg by mouth daily. 03/04/16   [provider]  valsartan-hydrochlorothiazide (DIOVAN-HCT) 320-25 MG tablet TAKE 1 TABLET BY MOUTH DAILY. 05/25/22   Nafziger, Kandee Keen, NP  warfarin (COUMADIN) 5 MG tablet Take 1 tablet (5 mg total) by mouth daily. Patient not taking: Reported on 12/09/2022 10/05/22   Marinus Maw, MD      Allergies    Contrast media [iodinated contrast media]    Review of Systems   Review of Systems  Constitutional:  Positive for fatigue and unexpected weight change.  All other  systems reviewed and are negative.   Physical Exam Updated Vital Signs BP 126/72 (BP Location: Right Arm)   Pulse 60   Temp 98 F (36.7 C)   Resp 16   SpO2 98%  Physical Exam Vitals and nursing note reviewed.  Constitutional:      Comments: Chronically ill  HENT:     Head: Normocephalic.     Nose: Nose normal.     Mouth/Throat:     Mouth: Mucous membranes are moist.  Eyes:     Comments: Scleral icterus   Cardiovascular:     Rate and Rhythm: Normal rate and regular rhythm.     Pulses: Normal pulses.     Heart sounds: Normal heart sounds.  Pulmonary:     Effort: Pulmonary effort is normal.     Breath sounds: Normal breath sounds.   Abdominal:     General: Abdomen is flat.     Comments: + Hepatomegaly  Musculoskeletal:        General: Normal range of motion.     Cervical back: Normal range of motion and neck supple.  Skin:    General: Skin is warm.     Capillary Refill: Capillary refill takes less than 2 seconds.     Comments: Patient has some petechiae all over  Neurological:     General: No focal deficit present.  Psychiatric:        Mood and Affect: Mood normal.     ED Results / Procedures / Treatments   Labs (all labs ordered are listed, but only abnormal results are displayed) Labs Reviewed  COMPREHENSIVE METABOLIC PANEL - Abnormal; Notable for the following components:      Result Value   Glucose, Bld 146 (*)    Total Protein 6.3 (*)    Albumin 2.9 (*)    AST 319 (*)    ALT 642 (*)    Alkaline Phosphatase 682 (*)    Total Bilirubin 7.1 (*)    GFR, Estimated 59 (*)    All other components within normal limits  CBC - Abnormal; Notable for the following components:   RBC 4.11 (*)    Hemoglobin 12.1 (*)    HCT 37.3 (*)    RDW 17.6 (*)    All other components within normal limits  PROTIME-INR - Abnormal; Notable for the following components:   Prothrombin Time 30.3 (*)    INR 2.9 (*)    All other components within normal limits  TYPE AND SCREEN    EKG None  Radiology No results found.  Procedures Procedures    Medications Ordered in ED Medications  diphenhydrAMINE (BENADRYL) injection 25 mg (has no administration in time range)    ED Course/ Medical Decision Making/ A&P                             Medical Decision Making Marcus [name] is a 82 y.o. male here presenting with elevated INR.  Patient was given a dose of vitamin K yesterday.  Will repeat INR and check CBC and CMP.  4:27 PM INR is 2.9.  However his LFTs has worsened.  His T. bili is now at 7.1.  His alk phos is 680.  AST is 319 and ALT is 642.  I discussed case with Dr. Leone Payor from GI.  He wants INR less than 2.   He states that patient needs to be admitted for monitoring and for worsening LFTs and T.  bili.  GI to see the patient tomorrow.   Problems Addressed: Elevated INR: acute illness or injury Elevated LFTs: acute illness or injury  Amount and/or Complexity of Data Reviewed Labs: ordered. Decision-making details documented in ED Course.  Risk Prescription drug management.    Final Clinical Impression(s) / ED Diagnoses Final diagnoses:  None    Rx / DC Orders ED Discharge Orders     None         Charlynne Pander, MD 12/10/22 [phone]

## 2022-12-10 NOTE — Telephone Encounter (Signed)
error 

## 2022-12-10 NOTE — Telephone Encounter (Signed)
PT requesting call back to get some further clarity on Dr. Marvell Fuller recommendations. Please advise.

## 2022-12-10 NOTE — Telephone Encounter (Addendum)
I think we could make a case to admit him -he has a cancer problem + over-anticoagulation and other sig health issues.  That way he could get INR corrected and I could do his ERCP next week.  He also has bad pruritus which will not be resolved until he gets a biliary stent.  I have certainly seen people admitted for less than this.  He would need to go to ED and tell them to read this note.  That is my recommendation - I think he is failing outpatient treatment.  I called the patient and his son and his daughter I left a voicemail with the daughter though I did not indicate that he should go to the ER because I did not want to alarm them.  I told her we would try to call back later today.  Clayborne Dana if you are able try to reach the patient and or son or daughter again later and explained that I think it is in his best interest to go to the ER for admission because of his problems and then we can sort this out and I could do the ERCP hopefully early next week   I would recommend going to [facility]   It would not be unreasonable to wait until tomorrow morning when it is less busy if he is doing okay

## 2022-12-10 NOTE — Telephone Encounter (Signed)
Patient with diagnosis of atrial fibrillation on warfarin for anticoagulation.    Procedure: EUS ERCP Date of procedure: 12/16/22   CHA2DS2-VASc Score = 4   This indicates a 4.8% annual risk of stroke. The patient's score is based upon: CHF History: 0 HTN History: 1 Diabetes History: 0 Stroke History: 0 Vascular Disease History: 1 Age Score: 2 Gender Score: 0    CrCl 65 Platelet count 229  Per office protocol, patient can hold warfarin for 5 days prior to procedure.   Patient will not need bridging with Lovenox (enoxaparin) around procedure.  (Warfarin currently on hold for INR 12/09/22 of > 10).  Coumadin Clinic advised of date change  **This guidance is not considered finalized until pre-operative APP has relayed final recommendations.**

## 2022-12-10 NOTE — Telephone Encounter (Signed)
Marcus [name] returned call and we discussed the coumadin hold.  See alternate note sent to Dr Meridee Score to advise on procedure (reschedule ??)

## 2022-12-10 NOTE — Telephone Encounter (Signed)
[name], With that INR we certainly are not going to be ready for a procedure likely next week. I don't think I have any availability for EUS/ERCP until July at this point. You can move him there for now (not sure if he'll be able to wait until then but can't do next week). I'll let [facility] know about the INR issues and the reason that the procedure is now on hold. If there is another ERCPist that can help with attempt, we will likely need to look into the 3rd and 4th weeks of June for procedure (but I'll let CEG work with his team to see what may be possible and if not, then to open up to RG and MS and JP). Thanks. GM  FYI CEG, INR 16 and >10 yesterday.  They need to see him in clinic before we got Coumadin hold.  GM

## 2022-12-10 NOTE — Progress Notes (Signed)
RT spoke with patient about wearing CPAP and patient stated he did not want wear currently.

## 2022-12-10 NOTE — Telephone Encounter (Signed)
Left message on machine to call back  

## 2022-12-11 DIAGNOSIS — C801 Malignant (primary) neoplasm, unspecified: Secondary | ICD-10-CM | POA: Diagnosis not present

## 2022-12-11 DIAGNOSIS — K831 Obstruction of bile duct: Secondary | ICD-10-CM | POA: Diagnosis not present

## 2022-12-11 DIAGNOSIS — I10 Essential (primary) hypertension: Secondary | ICD-10-CM | POA: Diagnosis not present

## 2022-12-11 DIAGNOSIS — I4891 Unspecified atrial fibrillation: Secondary | ICD-10-CM | POA: Diagnosis not present

## 2022-12-11 DIAGNOSIS — Z9884 Bariatric surgery status: Secondary | ICD-10-CM | POA: Diagnosis not present

## 2022-12-11 DIAGNOSIS — I482 Chronic atrial fibrillation, unspecified: Secondary | ICD-10-CM | POA: Diagnosis present

## 2022-12-11 DIAGNOSIS — R791 Abnormal coagulation profile: Secondary | ICD-10-CM | POA: Diagnosis present

## 2022-12-11 DIAGNOSIS — M1712 Unilateral primary osteoarthritis, left knee: Secondary | ICD-10-CM | POA: Diagnosis present

## 2022-12-11 DIAGNOSIS — E785 Hyperlipidemia, unspecified: Secondary | ICD-10-CM | POA: Diagnosis present

## 2022-12-11 DIAGNOSIS — Z7901 Long term (current) use of anticoagulants: Secondary | ICD-10-CM | POA: Diagnosis not present

## 2022-12-11 DIAGNOSIS — R17 Unspecified jaundice: Secondary | ICD-10-CM

## 2022-12-11 DIAGNOSIS — Z96652 Presence of left artificial knee joint: Secondary | ICD-10-CM | POA: Diagnosis present

## 2022-12-11 DIAGNOSIS — R7989 Other specified abnormal findings of blood chemistry: Secondary | ICD-10-CM | POA: Diagnosis not present

## 2022-12-11 DIAGNOSIS — G4733 Obstructive sleep apnea (adult) (pediatric): Secondary | ICD-10-CM | POA: Diagnosis present

## 2022-12-11 DIAGNOSIS — E440 Moderate protein-calorie malnutrition: Secondary | ICD-10-CM | POA: Diagnosis present

## 2022-12-11 DIAGNOSIS — E876 Hypokalemia: Secondary | ICD-10-CM | POA: Diagnosis not present

## 2022-12-11 DIAGNOSIS — M109 Gout, unspecified: Secondary | ICD-10-CM | POA: Diagnosis present

## 2022-12-11 DIAGNOSIS — I2510 Atherosclerotic heart disease of native coronary artery without angina pectoris: Secondary | ICD-10-CM | POA: Diagnosis not present

## 2022-12-11 DIAGNOSIS — I480 Paroxysmal atrial fibrillation: Secondary | ICD-10-CM | POA: Diagnosis not present

## 2022-12-11 DIAGNOSIS — R634 Abnormal weight loss: Secondary | ICD-10-CM

## 2022-12-11 DIAGNOSIS — E669 Obesity, unspecified: Secondary | ICD-10-CM | POA: Diagnosis present

## 2022-12-11 DIAGNOSIS — R978 Other abnormal tumor markers: Secondary | ICD-10-CM | POA: Diagnosis present

## 2022-12-11 DIAGNOSIS — C251 Malignant neoplasm of body of pancreas: Secondary | ICD-10-CM | POA: Diagnosis not present

## 2022-12-11 DIAGNOSIS — Z87891 Personal history of nicotine dependence: Secondary | ICD-10-CM | POA: Diagnosis not present

## 2022-12-11 DIAGNOSIS — L299 Pruritus, unspecified: Secondary | ICD-10-CM | POA: Diagnosis present

## 2022-12-11 DIAGNOSIS — K219 Gastro-esophageal reflux disease without esophagitis: Secondary | ICD-10-CM | POA: Diagnosis present

## 2022-12-11 DIAGNOSIS — D630 Anemia in neoplastic disease: Secondary | ICD-10-CM | POA: Diagnosis not present

## 2022-12-11 DIAGNOSIS — K769 Liver disease, unspecified: Secondary | ICD-10-CM | POA: Diagnosis present

## 2022-12-11 DIAGNOSIS — G459 Transient cerebral ischemic attack, unspecified: Secondary | ICD-10-CM | POA: Diagnosis not present

## 2022-12-11 DIAGNOSIS — I442 Atrioventricular block, complete: Secondary | ICD-10-CM | POA: Diagnosis present

## 2022-12-11 DIAGNOSIS — Z6832 Body mass index (BMI) 32.0-32.9, adult: Secondary | ICD-10-CM | POA: Diagnosis not present

## 2022-12-11 DIAGNOSIS — Z91041 Radiographic dye allergy status: Secondary | ICD-10-CM | POA: Diagnosis not present

## 2022-12-11 DIAGNOSIS — H532 Diplopia: Secondary | ICD-10-CM | POA: Diagnosis not present

## 2022-12-11 DIAGNOSIS — J479 Bronchiectasis, uncomplicated: Secondary | ICD-10-CM | POA: Diagnosis present

## 2022-12-11 LAB — COMPREHENSIVE METABOLIC PANEL
ALT: 553 U/L — ABNORMAL HIGH (ref 0–44)
AST: 264 U/L — ABNORMAL HIGH (ref 15–41)
Albumin: 2.7 g/dL — ABNORMAL LOW (ref 3.5–5.0)
Alkaline Phosphatase: 643 U/L — ABNORMAL HIGH (ref 38–126)
Anion gap: 11 (ref 5–15)
BUN: 20 mg/dL (ref 8–23)
CO2: 23 mmol/L (ref 22–32)
Calcium: 8.7 mg/dL — ABNORMAL LOW (ref 8.9–10.3)
Chloride: 105 mmol/L (ref 98–111)
Creatinine, Ser: 1.2 mg/dL (ref 0.61–1.24)
GFR, Estimated: >60 mL/min (ref >60)
Glucose, Bld: 126 mg/dL — ABNORMAL HIGH (ref 70–99)
Potassium: 3.5 mmol/L (ref 3.5–5.1)
Sodium: 139 mmol/L (ref 135–145)
Total Bilirubin: 7.2 mg/dL — ABNORMAL HIGH (ref 0.3–1.2)
Total Protein: 5.6 g/dL — ABNORMAL LOW (ref 6.5–8.1)

## 2022-12-11 LAB — CBC
HCT: 35.4 % — ABNORMAL LOW (ref 39.0–52.0)
Hemoglobin: 11.8 g/dL — ABNORMAL LOW (ref 13.0–17.0)
MCH: 29.7 pg (ref 26.0–34.0)
MCHC: 33.3 g/dL (ref 30.0–36.0)
MCV: 89.2 fL (ref 80.0–100.0)
Platelets: 213 10*3/uL (ref 150–400)
RBC: 3.97 MIL/uL — ABNORMAL LOW (ref 4.22–5.81)
RDW: 17.3 % — ABNORMAL HIGH (ref 11.5–15.5)
WBC: 5.2 10*3/uL (ref 4.0–10.5)
nRBC: 0 % (ref 0.0–0.2)

## 2022-12-11 LAB — PROTIME-INR
INR: 2.1 — ABNORMAL HIGH (ref 0.8–1.2)
Prothrombin Time: 23.8 seconds — ABNORMAL HIGH (ref 11.4–15.2)

## 2022-12-11 LAB — SURGICAL PCR SCREEN
MRSA, PCR: NEGATIVE
Staphylococcus aureus: NEGATIVE

## 2022-12-11 MED ORDER — SODIUM CHLORIDE 0.9 % IV SOLN: INTRAVENOUS | Status: DC

## 2022-12-11 NOTE — Consult Note (Addendum)
Consultation  Referring Provider:  [facility] Primary Care Physician:  [name], NP Primary Gastroenterologist:  Dr.Gessner  Reason for Consultation: Jaundice, abnormal CT abdomen and pelvis concerning for pancreatic malignancy with biliary obstruction  HPI: Marcus [name] is a 82 y.o. male, with history of fibrillation for which she has been on Coumadin, remote colon cancer status post sigmoid colectomy 1989, history of prostate cancer status post radiation 2020, history of hypertension, complete heart block status post pacemaker placement, gout, sleep apnea, hypertension, bronchiectasis. He underwent a remote bariatric surgery about 40 years ago, patient is unaware of the name of the procedure.  This was done in [place]. Did have EGD in 2018 per Dr. Leone Payor which showed minutes of a stenosed gastroplasty/stapling with gastric pouch containing a bezoar, the stenosis was traversed and he was dilated to 20 mm.  Patient says that he developed unintentional weight loss over the past couple of months without any significant abdominal pain.  He feels that he was eating a normal amount.  He had not been having any nausea or vomiting.  He was seen by primary care on 11/24/2022, noted to have elevated LFTs and CT of the abdomen and pelvis had been ordered for 12/03/2022 .  There is a suspected pancreatic mass within the proximal body not well-defined on CT measuring about 2 cm, possible mass effect on the superior aspect of the SMA There is biliary and pancreatic ductal dilation and noted multiple hypodense liver lesions majority of which are 10 mm or less, lesions are indeterminant but concerning for possible metastatic disease.  Evidence of prior bariatric surgery presumed gastric bypass with distended gastric remnant, narrowing stones in the gallbladder  Patient has developed progressive pruritus over the past couple of weeks, and progressive jaundice. Yesterday was advised to come to the  emergency room to help expedite his workup.  Dr. Leone Payor had been trying to get him scheduled for an outpatient ERCP.  Patient had been on Coumadin and INR on 12/09/2022 was greater than 10.  He has been off Coumadin since then.  Labs in the ER yesterday-WBC 5.2/hemoglobin 11.8/hematocrit 35.4 BUN 20/creatinine 1.23 T. bili 7.1/alk phos 682/AST 319/ALT 642... Bili was 3.8 on 11/24/2022  Pro time 30.3 INR 2.9  Today's labs-INR 2.1 WBC 5.2/hemoglobin 11.8 T. bili 7.2/alk phos 643/AST 264/ALT 553  Patient still denying any abdominal pain, feels a bit fatigued and has not had much appetite though he is hungry this morning as he has been n.p.o.  Is been very itchy over the past several days.  Past Medical History:  Diagnosis Date   Acute meniscal tear of knee left   Arthritis    generalized   AV block 08/01/2018   Benign essential hypertension 01/18/2007   Bronchiectasis with acute exacerbation 06/23/2020   Chest pain, atypical 08/23/2014   Chronic cough 10/07/2014   Coronary artery disease    Degeneration of lumbar intervertebral disc 10/14/2017   Diverticulosis of colon 07/07/2005   Elevated PSA 06/19/2014   GERD (gastroesophageal reflux disease) 01/18/2007   Gout, unspecified 01/18/2007   Hemorrhoids 01/19/2011   Hiatal hernia    History of colonic polyps 01/18/2007   Insomnia, unspecified 08/21/2007   Iron deficiency anemia, unspecified  01/19/2011   Lumbar post-laminectomy syndrome 10/14/2017   Lumbar spondylolysis    Lumbar strain 12/14/2011   Obstructive sleep apnea 01/18/2007   uses CPAP nightly   Paraesophageal hernia    large   Primary osteoarthritis of knee 05/23/2015  Prostate cancer (HCC) 2016   S/P knee replacement 05/23/2015   Sensorineural hearing loss, bilateral    Vitamin B12 deficiency    Vitamin D deficiency 10/28/2009    Past Surgical History:  Procedure Laterality Date   BRONCHIAL WASHINGS  10/30/2019   Procedure: BRONCHIAL WASHINGS;  Surgeon: [name], DO;  Location: [facility];  Service: Endoscopy;;   CARDIAC CATHETERIZATION     CATARACT EXTRACTION W/ INTRAOCULAR LENS  IMPLANT, BILATERAL Bilateral    COLONOSCOPY     ESOPHAGOGASTRODUODENOSCOPY     KNEE ARTHROSCOPY Right 2007   KNEE ARTHROSCOPY  07/13/2011   Procedure: ARTHROSCOPY KNEE;  Surgeon: Erasmo Leventhal;  Location: [facility];  Service: Orthopedics;  Laterality: Left;  partial menisectomy with chondrylplasty   KNEE SURGERY Left    LAMINECTOMY AND MICRODISCECTOMY LUMBAR SPINE  MARCH  2008   L3 -  4   LAPAROSCOPIC INCISIONAL / UMBILICAL / VENTRAL HERNIA REPAIR  2006   PACEMAKER IMPLANT N/A 08/02/2018   Procedure: PACEMAKER IMPLANT;  Surgeon: Marinus Maw, MD;  Location: [facility];  Service: Cardiovascular;  Laterality: N/A;   PROSTATE BIOPSY     RADIOACTIVE SEED IMPLANT N/A 02/12/2019   Procedure: RADIOACTIVE SEED IMPLANT/BRACHYTHERAPY IMPLANT;  Surgeon: Marcine Matar, MD;  Location: [facility];  Service: Urology;  Laterality: N/A;  90 MINS   SHOULDER ARTHROSCOPY Right 05-23-2007   SIGMOID COLECTOMY FOR CANCER  1989   SPACE OAR INSTILLATION N/A 02/12/2019   Procedure: SPACE OAR INSTILLATION;  Surgeon: Marcine Matar, MD;  Location: [facility];  Service: Urology;  Laterality: N/A;   TOTAL KNEE ARTHROPLASTY Left 05/23/2015   Procedure: LEFT TOTAL KNEE ARTHROPLASTY;  Surgeon: Eugenia Mcalpine, MD;  Location: [facility];  Service: Orthopedics;  Laterality: Left;   VERTICAL BANDED GASTROPLASTY  1986   VIDEO BRONCHOSCOPY N/A 10/30/2019   Procedure: VIDEO BRONCHOSCOPY WITH BRONICAL ALVEROLAR LAVAGE WITHOUT FLUORO;  Surgeon: [name], DO;  Location: [facility];  Service: Endoscopy;  Laterality: N/A;    Prior to Admission medications   Medication Sig Start Date End Date Taking? Authorizing Provider  allopurinol (ZYLOPRIM) 300 MG tablet Take 1 tablet (300 mg total) by mouth daily. 05/25/22  Yes Nafziger, Kandee Keen, NP  amLODipine (NORVASC) 10 MG  tablet Take 1 tablet (10 mg total) by mouth daily. 05/25/22  Yes Nafziger, Kandee Keen, NP  Calcium Carb-Cholecalciferol (CALCIUM 600 + D PO) Take 1 tablet by mouth daily. 800 units of vitamin D   Yes [provider]  hydrOXYzine (ATARAX) 50 MG tablet Take 1 tablet (50 mg total) by mouth 3 (three) times daily as needed for itching. 11/30/22  Yes Ardith Dark, MD  Multiple Vitamin (MULTIVITAMIN) tablet Take 1 tablet by mouth in the morning.   Yes [provider]  phytonadione (VITAMIN K) 5 MG tablet Take 1 tablet by mouth NOW. 12/09/22  Yes Marinus Maw, MD  sodium chloride HYPERTONIC 3 % nebulizer solution Take by nebulization as needed for other. 04/01/22  Yes [name], NP  tamsulosin (FLOMAX) 0.4 MG CAPS capsule Take 0.4 mg by mouth daily. 03/04/16  Yes [provider]  warfarin (COUMADIN) 5 MG tablet Take 1 tablet (5 mg total) by mouth daily. 10/05/22  Yes Marinus Maw, MD  Respiratory Therapy Supplies (FLUTTER) DEVI 1 each by Does not apply route in the morning and at bedtime. 10/11/19   Karie Fetch P, DO  valsartan-hydrochlorothiazide (DIOVAN-HCT) 320-25 MG tablet TAKE 1 TABLET BY MOUTH DAILY. Patient  taking differently: Take 1 tablet by mouth daily. 05/25/22   Nafziger, Kandee Keen, NP    Current Facility-Administered Medications  Medication Dose Route Frequency Provider Last Rate Last Admin   0.9 %  sodium chloride infusion   Intravenous Continuous Mansouraty, Netty Starring., MD 20 mL/hr at 12/11/22 0624 New Bag at 12/11/22 0624   acetaminophen (TYLENOL) tablet 650 mg  650 mg Oral Q6H PRN Synetta Fail, MD       Or   acetaminophen (TYLENOL) suppository 650 mg  650 mg Rectal Q6H PRN Synetta Fail, MD       allopurinol (ZYLOPRIM) tablet 300 mg  300 mg Oral Daily Synetta Fail, MD   300 mg at 12/11/22 1023   amLODipine (NORVASC) tablet 10 mg  10 mg Oral Daily Synetta Fail, MD   10 mg at 12/11/22 1023   cholestyramine (QUESTRAN) packet 4 g  4 g  Oral BID Synetta Fail, MD   4 g at 12/11/22 1023   irbesartan (AVAPRO) tablet 300 mg  300 mg Oral Daily Francena Hanly, RPH   300 mg at 12/11/22 1023   And   hydrochlorothiazide (HYDRODIURIL) tablet 25 mg  25 mg Oral Daily Francena Hanly, RPH   25 mg at 12/11/22 1023   hydrOXYzine (ATARAX) tablet 50 mg  50 mg Oral BID PRN Synetta Fail, MD   50 mg at 12/10/22 2138   polyethylene glycol (MIRALAX / GLYCOLAX) packet 17 g  17 g Oral Daily PRN Synetta Fail, MD       sodium chloride flush (NS) 0.9 % injection 3 mL  3 mL Intravenous Q12H Synetta Fail, MD   3 mL at 12/10/22 2305   tamsulosin (FLOMAX) capsule 0.4 mg  0.4 mg Oral Daily Synetta Fail, MD   0.4 mg at 12/11/22 1023    Allergies as of 12/10/2022 - Review Complete 12/10/2022  Allergen Reaction Noted   Contrast media [iodinated contrast media] Palpitations 07/09/2011    Family History  Problem Relation Age of Onset   Stroke Paternal Grandfather    Heart disease Paternal Grandfather    Esophagitis Father        died from perforated esophagus   Breast cancer Mother    Colon cancer Maternal Grandmother        questionable   Esophageal cancer Neg Hx    Prostate cancer Neg Hx    Stomach cancer Neg Hx     Social History   Socioeconomic History   Marital status: Widowed    Spouse name: Not on file   Number of children: 4   Years of education: 87   Highest education level: Some college, no degree  Occupational History   Occupation: retired    Associate Professor: [employer]  Tobacco Use   Smoking status: Former    Packs/day: 2.00    Years: 20.00    Additional pack years: 0.00    Total pack years: 40.00    Types: Cigarettes    Quit date: 07/05/1974    Years since quitting: 48.4   Smokeless tobacco: Never  Vaping Use   Vaping Use: Never used  Substance and Sexual Activity   Alcohol use: Not Currently    Alcohol/week: 30.0 standard drinks of alcohol    Types: 30 Standard drinks or  equivalent per week    Comment: 4-5 glasses of wine or 2-3 beers nightly   Drug use: No   Sexual activity: Not Currently  Other  Topics Concern   Not on file  Social History Narrative   Recently widowed   Former Smoker    Alcohol use-yes 1-2 drinks per day      Occupation: Retired Associate Professor      Originally from [place], [place] - in [place] > 10 yrs as of 2017      Social Determinants of Health   Financial Resource Strain: Low Risk  (07/28/2021)   Overall Financial Resource Strain (CARDIA)    Difficulty of Paying Living Expenses: Not hard at all  Food Insecurity: No Food Insecurity (12/10/2022)   Hunger Vital Sign    Worried About Running Out of Food in the Last Year: Never true    Ran Out of Food in the Last Year: Never true  Transportation Needs: No Transportation Needs (12/10/2022)   PRAPARE - Administrator, Civil Service (Medical): No    Lack of Transportation (Non-Medical): No  Physical Activity: Inactive (07/28/2021)   Exercise Vital Sign    Days of Exercise per Week: 0 days    Minutes of Exercise per Session: 0 min  Stress: No Stress Concern Present (07/28/2021)   [employer] of Occupational Health - Occupational Stress Questionnaire    Feeling of Stress : Not at all  Social Connections: Moderately Isolated (08/04/2020)   Social Connection and Isolation Panel [NHANES]    Frequency of Communication with Friends and Family: Three times a week    Frequency of Social Gatherings with Friends and Family: More than three times a week    Attends Religious Services: More than 4 times per year    Active Member of Clubs or Organizations: No    Attends Banker Meetings: Never    Marital Status: Widowed  Intimate Partner Violence: Not At Risk (12/10/2022)   Humiliation, Afraid, Rape, and Kick questionnaire    Fear of Current or Ex-Partner: No    Emotionally Abused: No    Physically Abused: No    Sexually Abused: No    Review of Systems: Pertinent positive and  negative review of systems were noted in the above HPI section.  All other review of systems was otherwise negative.   Physical Exam: Vital signs in last 24 hours: Temp:  [97.4 F (36.3 C)-98 F (36.7 C)] 97.9 F (36.6 C) (06/08 0725) Pulse Rate:  [59-61] 59 (06/08 0725) Resp:  [14-19] 14 (06/08 0508) BP: (126-160)/(71-83) 140/83 (06/08 0725) SpO2:  [95 %-100 %] 95 % (06/08 0725)   General:   Alert,  Well-developed, well-nourished, elderly white male, jaundiced, pleasant and cooperative in NAD Head:  Normocephalic and atraumatic. Eyes:  Sclera icteric, conjunctiva pink. Ears:  Normal auditory acuity. Nose:  No deformity, discharge,  or lesions. Mouth:  No deformity or lesions.   Neck:  Supple; no masses or thyromegaly. Lungs:  Clear throughout to auscultation.   No wheezes, crackles, or rhonchi . Heart:  Regular rate and rhythm; no murmurs, clicks, rubs,  or gallops. Abdomen:  Soft,nontender, BS active,nonpalp mass or hsm.   Rectal: Not done today Msk:  Symmetrical without gross deformities. . Pulses:  Normal pulses noted. Extremities:  Without clubbing or edema. Neurologic:  Alert and  oriented x4;  grossly normal neurologically. Skin: Jaundiced with multiple excoriations from itching Psych:  Alert and cooperative. Normal mood and affect.  Intake/Output from previous day: 06/07 0701 - 06/08 0700 In: 240 [P.O.:240] Out: -  Intake/Output this shift: No intake/output data recorded.  Lab Results: Recent Labs    12/10/22  1444 12/11/22 0047  WBC 5.8 5.2  HGB 12.1* 11.8*  HCT 37.3* 35.4*  PLT 242 213   BMET Recent Labs    12/10/22 1444 12/11/22 0047  NA 139 139  K 3.9 3.5  CL 106 105  CO2 25 23  GLUCOSE 146* 126*  BUN 20 20  CREATININE 1.23 1.20  CALCIUM 9.2 8.7*   LFT Recent Labs    12/11/22 0047  PROT 5.6*  ALBUMIN 2.7*  AST 264*  ALT 553*  ALKPHOS 643*  BILITOT 7.2*   PT/INR Recent Labs    12/10/22 1444 12/11/22 0047  LABPROT 30.3* 23.8*   INR 2.9* 2.1*   Hepatitis Panel No results for input(s): "HEPBSAG", "HCVAB", "HEPAIGM", "HEPBIGM" in the last 72 hours.    IMPRESSION:  #11 82 year old white male with recent unintentional weight loss, found on outpatient CT 12/03/2022 to have a probable pancreatic body mass and multiple hypodense liver lesions majority of which are less than 10 mm but concerning for metastatic disease-largest lesion 2.8 cm. Intrahepatic biliary ductal dilation and CBD dilation.  He has developed progressive jaundice over the past week and pruritus but no current abdominal pain but poor appetite.  No fever or chills  #2 remote gastric bypass-EGD 2018 had shown a stenosed gastric body consistent with a stenosed gastroplasty/stapling this was TTS dilated to 20 mm  #3 history of prostate cancer post seed implants #4 remote history of colon cancer 1989 status post sigmoidectomy #5 atrial fibrillation-on chronic Coumadin with supratherapeutic INR-Coumadin on hold past few days and INR trending down to 2.0 today #6 coronary artery disease #7 history of complete heart block status post pacemaker placement   PLAN: Okay for regular diet today Continue to hold Coumadin Tentatively planned for ERCP with Dr. Leone Payor on Monday, 12/13/2022.  This may be technically challenging due to his prior gastric bypass surgery and previous history of stenosed gastroplasty If ERCP not feasible then we will require percutaneous drainage. Ideally he should have MRI but has pacemaker. CEA/CA 19-9. Will add Atarax for pruritus Plan was discussed with the patient in detail this morning, he is agreeable with this plan and appreciative of care.   Amy Esterwood PA-C 12/11/2022, 10:57 AM   Attending physician's note  I have taken a history, reviewed the chart and examined the patient. I performed a substantive portion of this encounter, including complete performance of at least one of the key components, in conjunction with the APP.  I agree with the APP's note, impression and recommendations.    82 year old very pleasant male with history of chronic A-fib on Coumadin, colon cancer s/p sigmoidectomy in 1989, prostate cancers treated with radiation, gastric exclusion surgery in the remote past approximately 4 years ago admitted with progressive jaundice associated with pruritus.  He has biliary and pancreatic duct dilation, multiple hypodense liver lesions concerning for possible metastatic disease with suspected pancreatic mass concerning for pancreatic CA  INR was >10 on 12/09/2022, has drifted down to 2 this morning Continue to hold Coumadin, may start heparin GTT without bolus if he has high thrombotic risk if INR trends down below 2  Will tentatively plan for ERCP with Dr. Leone Payor on Monday 12/13/22 If patient gets started on heparin GTT, will need to hold it 6 hours prior to the procedure Follow-up CEA and CA 19-9 Continue diet as tolerated N.p.o. after midnight tomorrow  The patient was provided an opportunity to ask questions and all were answered. The patient agreed with the plan and demonstrated an  understanding of the instructions.  Iona Beard , MD [phone]

## 2022-12-11 NOTE — Progress Notes (Signed)
PROGRESS NOTE  Marcus [name] WUX:[id number] DOB: 1941-06-01   PCP: [name], NP  Patient is from: Home.  Independently ambulates at baseline.  DOA: 12/10/2022 LOS: 0  Chief complaints Chief Complaint  Patient presents with   Abnormal Lab     Brief Narrative / Interim history: 82 year old M with PMH of A-fib on warfarin, colon cancer s/p sigmoid colectomy in 1989, prostate cancer s/p local radiation in 2020 CAD, HTN, CHB/PPM, GERD, gout, obesity, OSA, bronchiectasis, prostate cancer and previous bariatric surgery presenting with abnormal labs (elevated INR, LFT, bilirubin) and pancreatic mass noted on outpatient CT from 5/31.  Patient was seen by PCP for ongoing weight loss and elevated LFT.  CT abdomen and pelvis on 5/21 showed pancreatic mass measuring about 2 cm, biliary and pancreatic ductal dilation and multiple liver lesions.  His INR was elevated to 10.  He was started on vitamin K and directed to ED for further evaluation.  In ED, AST 319, ALT 642, T. bili 7.1 (from 4.3 135) and ALP 687.  INR 2.9.  He was typed and screened.  Received Benadryl.  GI consulted and planning ERCP once INR acceptable.     Subjective: Seen and examined earlier this morning.  No major events overnight of this morning.  Reports improvement in EEG.  Denies nausea, vomiting, abdominal pain.  Denies chest pain or dyspnea.  Objective: Vitals:   12/10/22 2024 12/11/22 0025 12/11/22 0508 12/11/22 0725  BP: 138/73 (!) 144/76 (!) 143/71 (!) 140/83  Pulse: (!) 59 60 61 (!) 59  Resp:  17 14   Temp: (!) 97.4 F (36.3 C) 97.9 F (36.6 C) 98 F (36.7 C) 97.9 F (36.6 C)  TempSrc: Oral Oral Oral Oral  SpO2: 98% 98% 97% 95%    Examination:  GENERAL: No apparent distress.  Nontoxic. HEENT: MMM.  Sclerae icteric NECK: Supple.  No apparent JVD.  RESP:  No IWOB.  Fair aeration bilaterally. CVS:  RRR. Heart sounds normal.  ABD/GI/GU: BS+. Abd soft, NTND.  MSK/EXT:  Moves extremities. No apparent  deformity. No edema.  SKIN: Skin jaundice in face NEURO: Awake, alert and oriented appropriately.  No apparent focal neuro deficit. PSYCH: Calm. Normal affect.   Procedures:  None  Microbiology summarized: None  Assessment and plan: Principal Problem:   Biliary obstruction due to malignant neoplasm [facility]) Active Problems:   Gout, unspecified   Obesity   Obstructive sleep apnea   Benign essential hypertension   GERD (gastroesophageal reflux disease)   Pacemaker   Coronary artery disease   Bronchiectasis without complication (HCC)   Atrial fibrillation (HCC)   Pancreatic mass   Status post bariatric surgery   Elevated LFTs  Obstructive jaundice likely due to pancreatic mass Liver lesions/elevated liver enzymes/hyperbilirubinemia -Patient was evaluated by PCP for ongoing weight loss and LFT.  Carries history of colon cancer and prostate cancer. -CT abdomen and pelvis on 5/31 concerning for 2 cm pancreatic mass within the proximal body, biliary and pancreatic ductal dilation and multiple liver lesions concerning for malignancy. -Continue trending LFTs and INR -Continue holding warfarin.  INR improving. -Continue n.p.o. pending GI evaluation   Chronic atrial fibrillation/complete heart block s/p PPM: Rate controlled.  INR 2.1. -Continue holding warfarin  CAD: No cardiopulmonary symptoms. -Continue home valsartan as above - Holding warfarin as above   Hypertension -Continue home amlodipine and losartan-hydrochlorothiazide  Bronchiectasis -Not currently on any inhalers   Status post bariatric surgery -Noted   History of colon cancer s/p sigmoid colectomy in  1989 History of prostate cancer s/p radioactive seed implant in 2020   OSA on CPAP: -Encouraged to use CPAP.    Gout - Continue home allopurinol  Obesity -Noted There is no height or weight on file to calculate BMI.           DVT prophylaxis:  Therapeutic INR  Code Status: Full code Family  Communication: None at bedside Level of care: Telemetry Medical Status is: Observation The patient will require care spanning > 2 midnights and should be moved to inpatient because: Obstructive jaundice   Final disposition: Home Consultants:  GI  55 minutes with more than 50% spent in reviewing records, counseling patient/family and coordinating care.   Sch Meds:  Scheduled Meds:  allopurinol  300 mg Oral Daily   amLODipine  10 mg Oral Daily   cholestyramine  4 g Oral BID   irbesartan  300 mg Oral Daily   And   hydrochlorothiazide  25 mg Oral Daily   sodium chloride flush  3 mL Intravenous Q12H   tamsulosin  0.4 mg Oral Daily   Continuous Infusions:  sodium chloride 20 mL/hr at 12/11/22 0624   PRN Meds:.acetaminophen **OR** acetaminophen, hydrOXYzine, polyethylene glycol  Antimicrobials: Anti-infectives (From admission, onward)    None        I have personally reviewed the following labs and images: CBC: Recent Labs  Lab 12/10/22 1444 12/11/22 0047  WBC 5.8 5.2  HGB 12.1* 11.8*  HCT 37.3* 35.4*  MCV 90.8 89.2  PLT 242 213   BMP &GFR Recent Labs  Lab 12/10/22 1444 12/11/22 0047  NA 139 139  K 3.9 3.5  CL 106 105  CO2 25 23  GLUCOSE 146* 126*  BUN 20 20  CREATININE 1.23 1.20  CALCIUM 9.2 8.7*   Estimated Creatinine Clearance: 54.2 mL/min (by C-G formula based on SCr of 1.2 mg/dL). Liver & Pancreas: Recent Labs  Lab 12/10/22 1444 12/11/22 0047  AST 319* 264*  ALT 642* 553*  ALKPHOS 682* 643*  BILITOT 7.1* 7.2*  PROT 6.3* 5.6*  ALBUMIN 2.9* 2.7*   No results for input(s): "LIPASE", "AMYLASE" in the last 168 hours. No results for input(s): "AMMONIA" in the last 168 hours. Diabetic: No results for input(s): "HGBA1C" in the last 72 hours. No results for input(s): "GLUCAP" in the last 168 hours. Cardiac Enzymes: No results for input(s): "CKTOTAL", "CKMB", "CKMBINDEX", "TROPONINI" in the last 168 hours. No results for input(s): "PROBNP" in  the last 8760 hours. Coagulation Profile: Recent Labs  Lab 12/09/22 0957 12/09/22 1019 12/10/22 1444 12/11/22 0047  INR 8.0* >10.0* 2.9* 2.1*   Thyroid Function Tests: No results for input(s): "TSH", "T4TOTAL", "FREET4", "T3FREE", "THYROIDAB" in the last 72 hours. Lipid Profile: No results for input(s): "CHOL", "HDL", "LDLCALC", "TRIG", "CHOLHDL", "LDLDIRECT" in the last 72 hours. Anemia Panel: No results for input(s): "VITAMINB12", "FOLATE", "FERRITIN", "TIBC", "IRON", "RETICCTPCT" in the last 72 hours. Urine analysis:    Component Value Date/Time   COLORURINE YELLOW 08/02/2020 1614   APPEARANCEUR CLEAR 08/02/2020 1614   LABSPEC 1.019 08/02/2020 1614   PHURINE 5.0 08/02/2020 1614   GLUCOSEU NEGATIVE 08/02/2020 1614   HGBUR NEGATIVE 08/02/2020 1614   BILIRUBINUR NEGATIVE 08/02/2020 1614   BILIRUBINUR n 10/10/2015 0919   KETONESUR NEGATIVE 08/02/2020 1614   PROTEINUR NEGATIVE 08/02/2020 1614   UROBILINOGEN 0.2 10/10/2015 0919   UROBILINOGEN 0.2 05/13/2015 0922   NITRITE NEGATIVE 08/02/2020 1614   LEUKOCYTESUR NEGATIVE 08/02/2020 1614   Sepsis Labs: Invalid input(s): "PROCALCITONIN", "LACTICIDVEN"  Microbiology: Recent Results (from the past 240 hour(s))  Surgical PCR screen     Status: None   Collection Time: 12/11/22  1:12 AM   Specimen: Nasal Mucosa; Nasal Swab  Result Value Ref Range Status   MRSA, PCR NEGATIVE NEGATIVE Final   Staphylococcus aureus NEGATIVE NEGATIVE Final    Comment: (NOTE) The Xpert SA Assay (FDA approved for NASAL specimens in patients 36 years of age and older), is one component of a comprehensive surveillance program. It is not intended to diagnose infection nor to guide or monitor treatment. Performed at [facility], [address], [place], [place] [address]     Radiology Studies: No results found.    Marcus [name] T. Antionne Enrique [facility]  If 7PM-7AM, please contact night-coverage [email or web address] 12/11/2022, 9:39 AM

## 2022-12-11 NOTE — Progress Notes (Signed)
   12/11/22 2313  BiPAP/CPAP/SIPAP  Reason BIPAP/CPAP not in use Non-compliant

## 2022-12-12 DIAGNOSIS — J479 Bronchiectasis, uncomplicated: Secondary | ICD-10-CM | POA: Diagnosis not present

## 2022-12-12 DIAGNOSIS — I2510 Atherosclerotic heart disease of native coronary artery without angina pectoris: Secondary | ICD-10-CM | POA: Diagnosis not present

## 2022-12-12 DIAGNOSIS — R7989 Other specified abnormal findings of blood chemistry: Secondary | ICD-10-CM | POA: Diagnosis not present

## 2022-12-12 DIAGNOSIS — R17 Unspecified jaundice: Secondary | ICD-10-CM | POA: Diagnosis not present

## 2022-12-12 DIAGNOSIS — K831 Obstruction of bile duct: Secondary | ICD-10-CM | POA: Diagnosis not present

## 2022-12-12 DIAGNOSIS — I4891 Unspecified atrial fibrillation: Secondary | ICD-10-CM | POA: Diagnosis not present

## 2022-12-12 LAB — COMPREHENSIVE METABOLIC PANEL
ALT: 504 U/L — ABNORMAL HIGH (ref 0–44)
AST: 238 U/L — ABNORMAL HIGH (ref 15–41)
Albumin: 2.5 g/dL — ABNORMAL LOW (ref 3.5–5.0)
Alkaline Phosphatase: 636 U/L — ABNORMAL HIGH (ref 38–126)
Anion gap: 9 (ref 5–15)
BUN: 23 mg/dL (ref 8–23)
CO2: 21 mmol/L — ABNORMAL LOW (ref 22–32)
Calcium: 8.6 mg/dL — ABNORMAL LOW (ref 8.9–10.3)
Chloride: 106 mmol/L (ref 98–111)
Creatinine, Ser: 1.18 mg/dL (ref 0.61–1.24)
GFR, Estimated: >60 mL/min (ref >60)
Glucose, Bld: 125 mg/dL — ABNORMAL HIGH (ref 70–99)
Potassium: 3.3 mmol/L — ABNORMAL LOW (ref 3.5–5.1)
Sodium: 136 mmol/L (ref 135–145)
Total Bilirubin: 8.7 mg/dL — ABNORMAL HIGH (ref 0.3–1.2)
Total Protein: 5.5 g/dL — ABNORMAL LOW (ref 6.5–8.1)

## 2022-12-12 LAB — PROTIME-INR
INR: 2.2 — ABNORMAL HIGH (ref 0.8–1.2)
Prothrombin Time: 25.1 seconds — ABNORMAL HIGH (ref 11.4–15.2)

## 2022-12-12 LAB — CBC
HCT: 35.6 % — ABNORMAL LOW (ref 39.0–52.0)
Hemoglobin: 11.8 g/dL — ABNORMAL LOW (ref 13.0–17.0)
MCH: 30.6 pg (ref 26.0–34.0)
MCHC: 33.1 g/dL (ref 30.0–36.0)
MCV: 92.2 fL (ref 80.0–100.0)
Platelets: 201 10*3/uL (ref 150–400)
RBC: 3.86 MIL/uL — ABNORMAL LOW (ref 4.22–5.81)
RDW: 17.4 % — ABNORMAL HIGH (ref 11.5–15.5)
WBC: 5.3 10*3/uL (ref 4.0–10.5)
nRBC: 0 % (ref 0.0–0.2)

## 2022-12-12 LAB — PHOSPHORUS: Phosphorus: 3.8 mg/dL (ref 2.5–4.6)

## 2022-12-12 LAB — MAGNESIUM: Magnesium: 2.1 mg/dL (ref 1.7–2.4)

## 2022-12-12 MED ORDER — HYDRALAZINE HCL 25 MG PO TABS: 25.0000 mg | ORAL_TABLET | Freq: Four times a day (QID) | ORAL | Status: DC | PRN

## 2022-12-12 MED ORDER — HYDROXYZINE HCL 25 MG PO TABS
25.0000 mg | ORAL_TABLET | Freq: Four times a day (QID) | ORAL | Status: DC | PRN
  Administered 2022-12-13 (×2): 25 mg via ORAL
  Filled 2022-12-12 (×2): qty 1

## 2022-12-12 MED ORDER — POTASSIUM CHLORIDE CRYS ER 20 MEQ PO TBCR
40.0000 meq | EXTENDED_RELEASE_TABLET | ORAL | Status: AC
  Administered 2022-12-12 (×2): 40 meq via ORAL
  Filled 2022-12-12 (×2): qty 2

## 2022-12-12 NOTE — Progress Notes (Signed)
PROGRESS NOTE  Marcus [name] ZOX:[id number] DOB: 14-Aug-1940   PCP: [name], NP  Patient is from: Home.  Independently ambulates at baseline.  DOA: 12/10/2022 LOS: 1  Chief complaints Chief Complaint  Patient presents with   Abnormal Lab     Brief Narrative / Interim history: 82 year old M with PMH of A-fib on warfarin, colon cancer s/p sigmoid colectomy in 1989, prostate cancer s/p local radiation in 2020 CAD, HTN, CHB/PPM, GERD, gout, obesity, OSA, bronchiectasis, prostate cancer and previous bariatric surgery presenting with abnormal labs (elevated INR, LFT, bilirubin) and pancreatic mass noted on outpatient CT from 5/31.  Patient was seen by PCP for ongoing weight loss and elevated LFT.  CT abdomen and pelvis on 5/21 showed pancreatic mass measuring about 2 cm, biliary and pancreatic ductal dilation and multiple liver lesions.  His INR was elevated to 10.  He was started on vitamin K and directed to ED for further evaluation.  In ED, AST 319, ALT 642, T. bili 7.1 (from 4.3 135) and ALP 687.  INR 2.9.  He was typed and screened.  Received Benadryl.  GI consulted and planning ERCP on 6/10 if INR acceptable    Subjective: Seen and examined earlier this morning.  No major events overnight of this morning.  No complaints other than itching.  Denies pain, nausea or vomiting.  Objective: Vitals:   12/11/22 1557 12/11/22 2007 12/12/22 0353 12/12/22 0746  BP: (!) 155/61 (!) 148/73 116/64 (!) 149/99  Pulse: (!) 59 (!) 44 62 60  Resp: 16 18 19 17   Temp: 97.9 F (36.6 C) 97.8 F (36.6 C) 97.7 F (36.5 C) 98.3 F (36.8 C)  TempSrc: Oral Oral Oral Oral  SpO2: 97% 96% 97% 100%    Examination:  GENERAL: No apparent distress.  Nontoxic. HEENT: MMM.  Sclerae icteric NECK: Supple.  No apparent JVD.  RESP:  No IWOB.  Fair aeration bilaterally. CVS:  RRR. Heart sounds normal.  ABD/GI/GU: BS+. Abd soft, NTND.  MSK/EXT:  Moves extremities. No apparent deformity. No edema.  SKIN:  Skin jaundice in face NEURO: Awake, alert and oriented appropriately.  No apparent focal neuro deficit. PSYCH: Calm. Normal affect.   Procedures:  None  Microbiology summarized: None  Assessment and plan: Principal Problem:   Biliary obstruction due to malignant neoplasm [facility]) Active Problems:   Gout, unspecified   Obesity   Obstructive sleep apnea   Benign essential hypertension   GERD (gastroesophageal reflux disease)   Cardiac pacemaker in situ   Coronary artery disease   Bronchiectasis without complication (HCC)   Atrial fibrillation (HCC)   Pancreatic mass   Status post bariatric surgery   Elevated LFTs  Obstructive jaundice likely due to pancreatic mass Liver lesions/elevated liver enzymes/hyperbilirubinemia -Saw PCP for ongoing weight loss and LFT. Hx of colon cancer and prostate cancer. -CT on 5/31 concerning for 2 cm pancreatic mass within the proximal body, biliary and pancreatic ductal dilation and multiple liver lesions concerning for malignancy. -Continue trending LFTs and INR -Continue holding warfarin.  INR improving. -Regular diet and n.p.o. after midnight   Chronic atrial fibrillation/complete heart block s/p PPM: Rate controlled.  INR 2.2. -Continue holding warfarin  CAD: No cardiopulmonary symptoms. -Continue home valsartan as above -Holding warfarin as above   Hypertension -Continue home amlodipine and losartan -Discontinue HCTZ due to hypokalemia  Bronchiectasis -Not currently on any inhalers   Status post bariatric surgery -Noted   History of colon cancer s/p sigmoid colectomy in 1989 History of prostate cancer  s/p radioactive seed implant in 2020   OSA on CPAP: -Encouraged to use CPAP.  Hypokalemia -Monitor replenish as appropriate    Gout - Continue home allopurinol  Obesity -Noted There is no height or weight on file to calculate BMI.           DVT prophylaxis:  Therapeutic INR  Code Status: Full code Family  Communication: Updated patient's daughter over the phone. Level of care: Telemetry Medical Status is: Inpatient The patient will remain to inpatient because: Obstructive jaundice   Final disposition: Home Consultants:  GI  35 minutes with more than 50% spent in reviewing records, counseling patient/family and coordinating care.   Sch Meds:  Scheduled Meds:  allopurinol  300 mg Oral Daily   amLODipine  10 mg Oral Daily   cholestyramine  4 g Oral BID   irbesartan  300 mg Oral Daily   potassium chloride  40 mEq Oral Q4H   sodium chloride flush  3 mL Intravenous Q12H   tamsulosin  0.4 mg Oral Daily   Continuous Infusions:  sodium chloride 20 mL/hr at 12/11/22 0624   PRN Meds:.acetaminophen **OR** acetaminophen, hydrALAZINE, hydrOXYzine, polyethylene glycol  Antimicrobials: Anti-infectives (From admission, onward)    None        I have personally reviewed the following labs and images: CBC: Recent Labs  Lab 12/10/22 1444 12/11/22 0047 12/12/22 0056  WBC 5.8 5.2 5.3  HGB 12.1* 11.8* 11.8*  HCT 37.3* 35.4* 35.6*  MCV 90.8 89.2 92.2  PLT 242 213 201   BMP &GFR Recent Labs  Lab 12/10/22 1444 12/11/22 0047 12/12/22 0056  NA 139 139 136  K 3.9 3.5 3.3*  CL 106 105 106  CO2 25 23 21*  GLUCOSE 146* 126* 125*  BUN 20 20 23   CREATININE 1.23 1.20 1.18  CALCIUM 9.2 8.7* 8.6*  MG  --   --  2.1  PHOS  --   --  3.8   Estimated Creatinine Clearance: 55.2 mL/min (by C-G formula based on SCr of 1.18 mg/dL). Liver & Pancreas: Recent Labs  Lab 12/10/22 1444 12/11/22 0047 12/12/22 0056  AST 319* 264* 238*  ALT 642* 553* 504*  ALKPHOS 682* 643* 636*  BILITOT 7.1* 7.2* 8.7*  PROT 6.3* 5.6* 5.5*  ALBUMIN 2.9* 2.7* 2.5*   No results for input(s): "LIPASE", "AMYLASE" in the last 168 hours. No results for input(s): "AMMONIA" in the last 168 hours. Diabetic: No results for input(s): "HGBA1C" in the last 72 hours. No results for input(s): "GLUCAP" in the last 168  hours. Cardiac Enzymes: No results for input(s): "CKTOTAL", "CKMB", "CKMBINDEX", "TROPONINI" in the last 168 hours. No results for input(s): "PROBNP" in the last 8760 hours. Coagulation Profile: Recent Labs  Lab 12/09/22 0957 12/09/22 1019 12/10/22 1444 12/11/22 0047 12/12/22 0056  INR 8.0* >10.0* 2.9* 2.1* 2.2*   Thyroid Function Tests: No results for input(s): "TSH", "T4TOTAL", "FREET4", "T3FREE", "THYROIDAB" in the last 72 hours. Lipid Profile: No results for input(s): "CHOL", "HDL", "LDLCALC", "TRIG", "CHOLHDL", "LDLDIRECT" in the last 72 hours. Anemia Panel: No results for input(s): "VITAMINB12", "FOLATE", "FERRITIN", "TIBC", "IRON", "RETICCTPCT" in the last 72 hours. Urine analysis:    Component Value Date/Time   COLORURINE YELLOW 08/02/2020 1614   APPEARANCEUR CLEAR 08/02/2020 1614   LABSPEC 1.019 08/02/2020 1614   PHURINE 5.0 08/02/2020 1614   GLUCOSEU NEGATIVE 08/02/2020 1614   HGBUR NEGATIVE 08/02/2020 1614   BILIRUBINUR NEGATIVE 08/02/2020 1614   BILIRUBINUR n 10/10/2015 0919   KETONESUR NEGATIVE 08/02/2020  1614   PROTEINUR NEGATIVE 08/02/2020 1614   UROBILINOGEN 0.2 10/10/2015 0919   UROBILINOGEN 0.2 05/13/2015 0922   NITRITE NEGATIVE 08/02/2020 1614   LEUKOCYTESUR NEGATIVE 08/02/2020 1614   Sepsis Labs: Invalid input(s): "PROCALCITONIN", "LACTICIDVEN"  Microbiology: Recent Results (from the past 240 hour(s))  Surgical PCR screen     Status: None   Collection Time: 12/11/22  1:12 AM   Specimen: Nasal Mucosa; Nasal Swab  Result Value Ref Range Status   MRSA, PCR NEGATIVE NEGATIVE Final   Staphylococcus aureus NEGATIVE NEGATIVE Final    Comment: (NOTE) The Xpert SA Assay (FDA approved for NASAL specimens in patients 53 years of age and older), is one component of a comprehensive surveillance program. It is not intended to diagnose infection nor to guide or monitor treatment. Performed at [facility], [address], [place], [place] [address]      Radiology Studies: No results found.    Jonique Kulig T. Cezar Misiaszek [facility]  If 7PM-7AM, please contact night-coverage [email or web address] 12/12/2022, 2:54 PM

## 2022-12-12 NOTE — Progress Notes (Signed)
   12/12/22 2147  BiPAP/CPAP/SIPAP  Reason BIPAP/CPAP not in use Non-compliant

## 2022-12-12 NOTE — Progress Notes (Addendum)
Patient ID: Marcus [name], male   DOB: Nov 07, 1940, 82 y.o.   MRN: [id number]    Progress Note   Subjective   Day # 2  CC; jaundice, abnormal CT abdomen and pelvis concerning for pancreatic malignancy with biliary obstruction  PT  25.1/INR 2.2 WBC 5.3/hemoglobin 11.8/hematocrit 35.6 K+ 2.3/BUN 23/creatinine 1.18 T. bili 8.7/alk phos 636/AST 238/ALT 504 CEA/CA 19-9 pending  Patient is not having any abdominal pain, no nausea or vomiting says he is not resting well because he is itching all the time and the medicines do not seem to be working as well as they were.   Objective   Vital signs in last 24 hours: Temp:  [97.7 F (36.5 C)-98.3 F (36.8 C)] 98.3 F (36.8 C) (06/09 0746) Pulse Rate:  [44-62] 60 (06/09 0746) Resp:  [16-19] 17 (06/09 0746) BP: (116-155)/(61-99) 149/99 (06/09 0746) SpO2:  [96 %-100 %] 100 % (06/09 0746)   General:   Elderly white male in NAD-jaundiced Heart:  Regular rate and rhythm; no murmurs Lungs: Respirations even and unlabored, lungs CTA bilaterally Abdomen:  Soft, nontender and nondistended. Normal bowel sounds. Extremities:  Without edema. Neurologic:  Alert and oriented,  grossly normal neurologically. Psych:  Cooperative. Normal mood and affect.  Intake/Output from previous day: 06/08 0701 - 06/09 0700 In: 286 [P.O.:240; I.V.:46] Out: -  Intake/Output this shift: No intake/output data recorded.  Lab Results: Recent Labs    12/10/22 1444 12/11/22 0047 12/12/22 0056  WBC 5.8 5.2 5.3  HGB 12.1* 11.8* 11.8*  HCT 37.3* 35.4* 35.6*  PLT 242 213 201   BMET Recent Labs    12/10/22 1444 12/11/22 0047 12/12/22 0056  NA 139 139 136  K 3.9 3.5 3.3*  CL 106 105 106  CO2 25 23 21*  GLUCOSE 146* 126* 125*  BUN 20 20 23   CREATININE 1.23 1.20 1.18  CALCIUM 9.2 8.7* 8.6*   LFT Recent Labs    12/12/22 0056  PROT 5.5*  ALBUMIN 2.5*  AST 238*  ALT 504*  ALKPHOS 636*  BILITOT 8.7*   PT/INR Recent Labs    12/11/22 0047  12/12/22 0056  LABPROT 23.8* 25.1*  INR 2.1* 2.2*         Assessment / Plan:    #66 82 year old white male who presented with recent unintentional weight loss, had outpatient CT on 12/03/2022 and found to have a probable pancreatic body mass and multiple hypodense lesions in the liver majority of which are less than 1 cm but the largest is 2.8 cm and concerning for metastatic disease.  Also noted to have intrahepatic and extrahepatic biliary ductal dilation. He had developed progressive jaundice over the past week and worsening pruritus, poor appetite, no significant pain and no fever or chills. And on Coumadin for chronic atrial fibrillation and found to have an INR greater than 10.  Coumadin has been held  Admitted to expedite workup  INR 2.2 today T. bili gradually on the rise up to 8.7 today CEA/ CA 19-9 pending  #2 remote gastric bypass-EGD 2018 had shown stenosed gastric which required TTS dilation to 20 mm.  #3 history of prostate cancer status post seed implants 4.  Remote history of colon cancer 1989 status post sigmoidectomy 5.  Atrial fibrillation-on chronic Coumadin-supratherapeutic INR-NR 2.2 today #6 coronary artery disease 7.  History of complete heart block status post pacemaker placement  Plan; lower diet today, n.p.o. after midnight Continue to hold Coumadin Check INR in a.m. Is scheduled for ERCP with  Dr. Leone Payor on 12/13/2022.  This may be technically challenging due to his prior gastric bypass and history of stenosed gastroplasty.  If ERCP not feasible then we will require percutaneous drainage.  ERCP was discussed in detail with the patient, we reviewed images indications risks and benefits.  We also discussed percutaneous drainage as a possibility.  Will change dosing of Atarax to 25 mg every 6 hours as needed.   Principal Problem:   Biliary obstruction due to malignant neoplasm [facility]) Active Problems:   Gout, unspecified   Obesity   Obstructive sleep  apnea   Benign essential hypertension   GERD (gastroesophageal reflux disease)   Cardiac pacemaker in situ   Coronary artery disease   Bronchiectasis without complication (HCC)   Atrial fibrillation (HCC)   Pancreatic mass   Status post bariatric surgery   Elevated LFTs     LOS: 1 day   Amy EsterwoodPA-C  12/12/2022, 8:51 AM    Attending physician's note   I have taken history, reviewed the chart and examined the patient. I performed a substantive portion of this encounter, including complete performance of at least one of the key components, in conjunction with the APP. I agree with the Advanced Practitioner's note, impression and recommendations.   Discussed extensively with pt and daughters.  I have shown them CT films.  Explained ERCP-procedure, complications, alternatives.  All questions were answered.  There was evidence of previous stenosed gastroplasty 2018 requiring balloon dil.   Plan: -ERCP (with +/- EGD) with Dr. Leone Payor 6/10 -Check INR in a.m. (make sure it is 2 or less).  Continue to hold Coumadin -Follow CEA, CA 19-9   Edman Circle, MD [facility] [phone]

## 2022-12-12 NOTE — H&P (View-Only) (Signed)
Patient ID: Marcus [name], male   DOB: 08/02/1940, 82 y.o.   MRN: [id number]    Progress Note   Subjective   Day # 2  CC; jaundice, abnormal CT abdomen and pelvis concerning for pancreatic malignancy with biliary obstruction  PT  25.1/INR 2.2 WBC 5.3/hemoglobin 11.8/hematocrit 35.6 K+ 2.3/BUN 23/creatinine 1.18 T. bili 8.7/alk phos 636/AST 238/ALT 504 CEA/CA 19-9 pending  Patient is not having any abdominal pain, no nausea or vomiting says he is not resting well because he is itching all the time and the medicines do not seem to be working as well as they were.   Objective   Vital signs in last 24 hours: Temp:  [97.7 F (36.5 C)-98.3 F (36.8 C)] 98.3 F (36.8 C) (06/09 0746) Pulse Rate:  [44-62] 60 (06/09 0746) Resp:  [16-19] 17 (06/09 0746) BP: (116-155)/(61-99) 149/99 (06/09 0746) SpO2:  [96 %-100 %] 100 % (06/09 0746)   General:   Elderly white male in NAD-jaundiced Heart:  Regular rate and rhythm; no murmurs Lungs: Respirations even and unlabored, lungs CTA bilaterally Abdomen:  Soft, nontender and nondistended. Normal bowel sounds. Extremities:  Without edema. Neurologic:  Alert and oriented,  grossly normal neurologically. Psych:  Cooperative. Normal mood and affect.  Intake/Output from previous day: 06/08 0701 - 06/09 0700 In: 286 [P.O.:240; I.V.:46] Out: -  Intake/Output this shift: No intake/output data recorded.  Lab Results: Recent Labs    12/10/22 1444 12/11/22 0047 12/12/22 0056  WBC 5.8 5.2 5.3  HGB 12.1* 11.8* 11.8*  HCT 37.3* 35.4* 35.6*  PLT 242 213 201   BMET Recent Labs    12/10/22 1444 12/11/22 0047 12/12/22 0056  NA 139 139 136  K 3.9 3.5 3.3*  CL 106 105 106  CO2 25 23 21*  GLUCOSE 146* 126* 125*  BUN 20 20 23  CREATININE 1.23 1.20 1.18  CALCIUM 9.2 8.7* 8.6*   LFT Recent Labs    12/12/22 0056  PROT 5.5*  ALBUMIN 2.5*  AST 238*  ALT 504*  ALKPHOS 636*  BILITOT 8.7*   PT/INR Recent Labs    12/11/22 0047  12/12/22 0056  LABPROT 23.8* 25.1*  INR 2.1* 2.2*         Assessment / Plan:    #1 82-year-old white male who presented with recent unintentional weight loss, had outpatient CT on 12/03/2022 and found to have a probable pancreatic body mass and multiple hypodense lesions in the liver majority of which are less than 1 cm but the largest is 2.8 cm and concerning for metastatic disease.  Also noted to have intrahepatic and extrahepatic biliary ductal dilation. He had developed progressive jaundice over the past week and worsening pruritus, poor appetite, no significant pain and no fever or chills. And on Coumadin for chronic atrial fibrillation and found to have an INR greater than 10.  Coumadin has been held  Admitted to expedite workup  INR 2.2 today T. bili gradually on the rise up to 8.7 today CEA/ CA 19-9 pending  #2 remote gastric bypass-EGD 2018 had shown stenosed gastric which required TTS dilation to 20 mm.  #3 history of prostate cancer status post seed implants 4.  Remote history of colon cancer 1989 status post sigmoidectomy 5.  Atrial fibrillation-on chronic Coumadin-supratherapeutic INR-NR 2.2 today #6 coronary artery disease 7.  History of complete heart block status post pacemaker placement  Plan; lower diet today, n.p.o. after midnight Continue to hold Coumadin Check INR in a.m. Is scheduled for ERCP with   Dr. Gessner on 12/13/2022.  This may be technically challenging due to his prior gastric bypass and history of stenosed gastroplasty.  If ERCP not feasible then we will require percutaneous drainage.  ERCP was discussed in detail with the patient, we reviewed images indications risks and benefits.  We also discussed percutaneous drainage as a possibility.  Will change dosing of Atarax to 25 mg every 6 hours as needed.   Principal Problem:   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) Active Problems:   Gout, unspecified   Obesity   Obstructive sleep  apnea   Benign essential hypertension   GERD (gastroesophageal reflux disease)   Cardiac pacemaker in situ   Coronary artery disease   Bronchiectasis without complication (HCC)   Atrial fibrillation (HCC)   Pancreatic mass   Status post bariatric surgery   Elevated LFTs     LOS: 1 day   Amy EsterwoodPA-C  12/12/2022, 8:51 AM    Attending physician's note   I have taken history, reviewed the chart and examined the patient. I performed a substantive portion of this encounter, including complete performance of at least one of the key components, in conjunction with the APP. I agree with the Advanced Practitioner's note, impression and recommendations.   Discussed extensively with pt and daughters.  I have shown them CT films.  Explained ERCP-procedure, complications, alternatives.  All questions were answered.  There was evidence of previous stenosed gastroplasty 2018 requiring balloon dil.   Plan: -ERCP (with +/- EGD) with Dr. Gessner 6/10 -Check INR in a.m. (make sure it is 2 or less).  Continue to hold Coumadin -Follow CEA, CA 19-9   Raj Anneke Cundy, MD [facility] [phone]    

## 2022-12-13 ENCOUNTER — Inpatient Hospital Stay (HOSPITAL_COMMUNITY): Payer: No Typology Code available for payment source | Admitting: Certified Registered"

## 2022-12-13 ENCOUNTER — Inpatient Hospital Stay (HOSPITAL_COMMUNITY): Payer: No Typology Code available for payment source

## 2022-12-13 ENCOUNTER — Ambulatory Visit: Payer: No Typology Code available for payment source

## 2022-12-13 ENCOUNTER — Other Ambulatory Visit (HOSPITAL_COMMUNITY): Payer: Self-pay

## 2022-12-13 ENCOUNTER — Encounter (HOSPITAL_COMMUNITY): Payer: Self-pay | Admitting: Internal Medicine

## 2022-12-13 ENCOUNTER — Ambulatory Visit: Payer: No Typology Code available for payment source | Admitting: Internal Medicine

## 2022-12-13 ENCOUNTER — Encounter (HOSPITAL_COMMUNITY)
Admission: EM | Disposition: A | Discharge status: Home / Self Care | Payer: Self-pay | Source: Home / Self Care | Attending: Student

## 2022-12-13 DIAGNOSIS — J479 Bronchiectasis, uncomplicated: Secondary | ICD-10-CM | POA: Diagnosis not present

## 2022-12-13 DIAGNOSIS — I2510 Atherosclerotic heart disease of native coronary artery without angina pectoris: Secondary | ICD-10-CM

## 2022-12-13 DIAGNOSIS — I10 Essential (primary) hypertension: Secondary | ICD-10-CM

## 2022-12-13 DIAGNOSIS — Z87891 Personal history of nicotine dependence: Secondary | ICD-10-CM

## 2022-12-13 DIAGNOSIS — K831 Obstruction of bile duct: Secondary | ICD-10-CM | POA: Diagnosis not present

## 2022-12-13 DIAGNOSIS — C251 Malignant neoplasm of body of pancreas: Secondary | ICD-10-CM | POA: Diagnosis not present

## 2022-12-13 DIAGNOSIS — R7989 Other specified abnormal findings of blood chemistry: Secondary | ICD-10-CM | POA: Diagnosis not present

## 2022-12-13 DIAGNOSIS — C801 Malignant (primary) neoplasm, unspecified: Secondary | ICD-10-CM | POA: Diagnosis not present

## 2022-12-13 HISTORY — PX: SPHINCTEROTOMY: SHX5544

## 2022-12-13 HISTORY — PX: ERCP: SHX5425

## 2022-12-13 HISTORY — PX: BILIARY BRUSHING: SHX6843

## 2022-12-13 HISTORY — PX: BILIARY STENT PLACEMENT: SHX5538

## 2022-12-13 LAB — COMPREHENSIVE METABOLIC PANEL
ALT: 503 U/L — ABNORMAL HIGH (ref 0–44)
AST: 241 U/L — ABNORMAL HIGH (ref 15–41)
Albumin: 2.8 g/dL — ABNORMAL LOW (ref 3.5–5.0)
Alkaline Phosphatase: 702 U/L — ABNORMAL HIGH (ref 38–126)
Anion gap: 10 (ref 5–15)
BUN: 23 mg/dL (ref 8–23)
CO2: 22 mmol/L (ref 22–32)
Calcium: 9 mg/dL (ref 8.9–10.3)
Chloride: 105 mmol/L (ref 98–111)
Creatinine, Ser: 1.24 mg/dL (ref 0.61–1.24)
GFR, Estimated: 58 mL/min — ABNORMAL LOW (ref >60)
Glucose, Bld: 122 mg/dL — ABNORMAL HIGH (ref 70–99)
Potassium: 3.8 mmol/L (ref 3.5–5.1)
Sodium: 137 mmol/L (ref 135–145)
Total Bilirubin: 10 mg/dL — ABNORMAL HIGH (ref 0.3–1.2)
Total Protein: 6.1 g/dL — ABNORMAL LOW (ref 6.5–8.1)

## 2022-12-13 LAB — CBC
HCT: 37.3 % — ABNORMAL LOW (ref 39.0–52.0)
Hemoglobin: 12.3 g/dL — ABNORMAL LOW (ref 13.0–17.0)
MCH: 29.5 pg (ref 26.0–34.0)
MCHC: 33 g/dL (ref 30.0–36.0)
MCV: 89.4 fL (ref 80.0–100.0)
Platelets: 211 10*3/uL (ref 150–400)
RBC: 4.17 MIL/uL — ABNORMAL LOW (ref 4.22–5.81)
RDW: 17.5 % — ABNORMAL HIGH (ref 11.5–15.5)
WBC: 5.8 10*3/uL (ref 4.0–10.5)
nRBC: 0 % (ref 0.0–0.2)

## 2022-12-13 LAB — MAGNESIUM: Magnesium: 2.2 mg/dL (ref 1.7–2.4)

## 2022-12-13 LAB — PREPARE FRESH FROZEN PLASMA

## 2022-12-13 LAB — PROTIME-INR
INR: 2.5 — ABNORMAL HIGH (ref 0.8–1.2)
INR: 2 — ABNORMAL HIGH (ref 0.8–1.2)
INR: 1.6 — ABNORMAL HIGH (ref 0.8–1.2)
Prothrombin Time: 27.1 seconds — ABNORMAL HIGH (ref 11.4–15.2)
Prothrombin Time: 22.9 seconds — ABNORMAL HIGH (ref 11.4–15.2)
Prothrombin Time: 19.3 seconds — ABNORMAL HIGH (ref 11.4–15.2)

## 2022-12-13 LAB — PHOSPHORUS: Phosphorus: 3.8 mg/dL (ref 2.5–4.6)

## 2022-12-13 LAB — BPAM FFP

## 2022-12-13 SURGERY — ERCP, WITH INTERVENTION IF INDICATED
Anesthesia: General

## 2022-12-13 MED ORDER — VITAMIN K1 10 MG/ML IJ SOLN
2.0000 mg | Freq: Once | INTRAVENOUS | Status: AC
  Administered 2022-12-13: 2 mg via INTRAVENOUS
  Filled 2022-12-13: qty 0.2

## 2022-12-13 MED ORDER — ASPIRIN 325 MG PO TABS
325.0000 mg | ORAL_TABLET | Freq: Every day | ORAL | Status: DC
  Administered 2022-12-13: 325 mg via ORAL
  Filled 2022-12-13: qty 1

## 2022-12-13 MED ORDER — DEXAMETHASONE SODIUM PHOSPHATE 10 MG/ML IJ SOLN
INTRAMUSCULAR | Status: DC | PRN
  Administered 2022-12-13: 10 mg via INTRAVENOUS

## 2022-12-13 MED ORDER — CAMPHOR-MENTHOL 0.5-0.5 % EX LOTN
TOPICAL_LOTION | Freq: Every day | CUTANEOUS | Status: DC
  Filled 2022-12-13: qty 222

## 2022-12-13 MED ORDER — INDOMETHACIN 50 MG RE SUPP: 100.0000 mg | Freq: Once | RECTAL | Status: DC

## 2022-12-13 MED ORDER — EPHEDRINE SULFATE (PRESSORS) 50 MG/ML IJ SOLN
INTRAMUSCULAR | Status: DC | PRN
  Administered 2022-12-13: 5 mg via INTRAVENOUS

## 2022-12-13 MED ORDER — DICLOFENAC SUPPOSITORY 100 MG
RECTAL | Status: DC | PRN
  Administered 2022-12-13: 100 mg via RECTAL

## 2022-12-13 MED ORDER — FENTANYL CITRATE (PF) 100 MCG/2ML IJ SOLN
25.0000 ug | INTRAMUSCULAR | Status: DC | PRN
  Administered 2022-12-13 (×2): 50 ug via INTRAVENOUS

## 2022-12-13 MED ORDER — PHENYLEPHRINE HCL-NACL 20-0.9 MG/250ML-% IV SOLN
INTRAVENOUS | Status: DC | PRN
  Administered 2022-12-13: 25 ug/min via INTRAVENOUS

## 2022-12-13 MED ORDER — PROPOFOL 10 MG/ML IV BOLUS
INTRAVENOUS | Status: DC | PRN
  Administered 2022-12-13: 120 mg via INTRAVENOUS

## 2022-12-13 MED ORDER — CIPROFLOXACIN IN D5W 400 MG/200ML IV SOLN
INTRAVENOUS | Status: DC | PRN
  Administered 2022-12-13: 400 mg via INTRAVENOUS

## 2022-12-13 MED ORDER — ENOXAPARIN SODIUM 100 MG/ML IJ SOSY
95.0000 mg | PREFILLED_SYRINGE | Freq: Two times a day (BID) | INTRAMUSCULAR | Status: DC
  Filled 2022-12-13: qty 0.95

## 2022-12-13 MED ORDER — SODIUM CHLORIDE 0.9 % IV SOLN: 3.0000 g | Freq: Once | INTRAVENOUS | Status: DC

## 2022-12-13 MED ORDER — LIDOCAINE 2% (20 MG/ML) 5 ML SYRINGE
INTRAMUSCULAR | Status: DC | PRN
  Administered 2022-12-13: 60 mg via INTRAVENOUS

## 2022-12-13 MED ORDER — CIPROFLOXACIN IN D5W 400 MG/200ML IV SOLN
INTRAVENOUS | Status: AC
  Filled 2022-12-13: qty 200

## 2022-12-13 MED ORDER — LACTATED RINGERS IV SOLN: INTRAVENOUS | Status: DC

## 2022-12-13 MED ORDER — ONDANSETRON HCL 4 MG/2ML IJ SOLN: 4.0000 mg | Freq: Once | INTRAMUSCULAR | Status: DC | PRN

## 2022-12-13 MED ORDER — GLUCAGON HCL RDNA (DIAGNOSTIC) 1 MG IJ SOLR
INTRAMUSCULAR | Status: AC
  Filled 2022-12-13: qty 1

## 2022-12-13 MED ORDER — ASPIRIN 325 MG PO TABS: 325.0000 mg | ORAL_TABLET | Freq: Every day | ORAL | Status: DC

## 2022-12-13 MED ORDER — ROCURONIUM BROMIDE 10 MG/ML (PF) SYRINGE
PREFILLED_SYRINGE | INTRAVENOUS | Status: DC | PRN
  Administered 2022-12-13: 30 mg via INTRAVENOUS
  Administered 2022-12-13: 50 mg via INTRAVENOUS

## 2022-12-13 MED ORDER — ALUM & MAG HYDROXIDE-SIMETH 200-200-20 MG/5ML PO SUSP
30.0000 mL | Freq: Four times a day (QID) | ORAL | Status: DC | PRN
  Administered 2022-12-13: 30 mL via ORAL
  Filled 2022-12-13 (×2): qty 30

## 2022-12-13 MED ORDER — CAMPHOR-MENTHOL 0.5-0.5 % EX LOTN: TOPICAL_LOTION | CUTANEOUS | Status: DC | PRN

## 2022-12-13 MED ORDER — ONDANSETRON HCL 4 MG/2ML IJ SOLN
INTRAMUSCULAR | Status: DC | PRN
  Administered 2022-12-13: 4 mg via INTRAVENOUS

## 2022-12-13 MED ORDER — SODIUM CHLORIDE 0.9% IV SOLUTION: Freq: Once | INTRAVENOUS | Status: AC

## 2022-12-13 MED ORDER — GLUCAGON HCL RDNA (DIAGNOSTIC) 1 MG IJ SOLR
INTRAMUSCULAR | Status: DC | PRN
  Administered 2022-12-13: .5 mg via INTRAVENOUS

## 2022-12-13 MED ORDER — INDOMETHACIN 50 MG RE SUPP
RECTAL | Status: AC
  Filled 2022-12-13: qty 1

## 2022-12-13 MED ORDER — DICLOFENAC SUPPOSITORY 100 MG
RECTAL | Status: AC
  Filled 2022-12-13: qty 1

## 2022-12-13 MED ORDER — SUGAMMADEX SODIUM 200 MG/2ML IV SOLN
INTRAVENOUS | Status: DC | PRN
  Administered 2022-12-13: 200 mg via INTRAVENOUS

## 2022-12-13 MED ORDER — FENTANYL CITRATE (PF) 100 MCG/2ML IJ SOLN
INTRAMUSCULAR | Status: AC
  Filled 2022-12-13: qty 2

## 2022-12-13 MED ORDER — SODIUM CHLORIDE 0.9 % IV SOLN
INTRAVENOUS | Status: DC | PRN
  Administered 2022-12-13: 75 mL

## 2022-12-13 NOTE — TOC Benefit Eligibility Note (Signed)
Patient Product/process development scientist completed.    The patient is currently admitted and upon discharge could be taking enoxaparin (Lovenox) 100 mg/ml injection.  The current 30 day co-pay is $100.00.   The patient is insured through [employer]   This test claim was processed through [facility]- copay amounts may vary at other pharmacies due to pharmacy/plan contracts, or as the patient moves through the different stages of their insurance plan.  Roland Earl, CPHT Pharmacy Patient Advocate Specialist [facility] Patient Advocate Team Direct Number: [phone]  Fax: [phone]

## 2022-12-13 NOTE — Consult Note (Addendum)
Stroke Neurology Consultation Note  Consult Requested by: Almon Hercules, MD  Reason for Consult: stroke  Consult Date: 12/13/22   The history was obtained from the patient and family.  History of Present Illness:  Marcus [name] is a 82 y.o. Caucasian male with PMH of atrial fibrillation on Coumadin, hypertension pacemaker placement, GERD, prostate cancer, history of bariatric surgery, pancreatic mass found on CT 5/21.  He was referred for ERCP which is completed today.  He was being titrated off Coumadin for the procedure.  Postprocedure he had complained of diplopia after the procedure but did not complain to the nurse and around 4 PM.  He is unable to get anticoagulation post ERCP procedure.  His diplopia is getting better.  Family thinks his speech might be off and his upper lip is swollen postprocedure but he thinks is at his baseline.  No weakness to the arms legs.  CT was obtained and negative.  LSN: 1:30 PM at time of procedure tPA Given: No: Status post ERCP unable to get anticoagulation.  And low NIH. Modified Rankin scale: 0 No indication for LVO.   Past Medical History:  Diagnosis Date   Acute meniscal tear of knee left   Arthritis    generalized   AV block 08/01/2018   Benign essential hypertension 01/18/2007   Bronchiectasis with acute exacerbation 06/23/2020   Chest pain, atypical 08/23/2014   Chronic cough 10/07/2014   Coronary artery disease    Degeneration of lumbar intervertebral disc 10/14/2017   Diverticulosis of colon 07/07/2005   Elevated PSA 06/19/2014   GERD (gastroesophageal reflux disease) 01/18/2007   Gout, unspecified 01/18/2007   Hemorrhoids 01/19/2011   Hiatal hernia    History of colonic polyps 01/18/2007   Insomnia, unspecified 08/21/2007   Iron deficiency anemia, unspecified  01/19/2011   Lumbar post-laminectomy syndrome 10/14/2017   Lumbar spondylolysis    Lumbar strain 12/14/2011   Obstructive sleep apnea 01/18/2007   uses CPAP nightly    Paraesophageal hernia    large   Primary osteoarthritis of knee 05/23/2015   Prostate cancer (HCC) 2016   S/P knee replacement 05/23/2015   Sensorineural hearing loss, bilateral    Vitamin B12 deficiency    Vitamin D deficiency 10/28/2009    Past Surgical History:  Procedure Laterality Date   BRONCHIAL WASHINGS  10/30/2019   Procedure: BRONCHIAL WASHINGS;  Surgeon: [name], DO;  Location: [facility];  Service: Endoscopy;;   CARDIAC CATHETERIZATION     CATARACT EXTRACTION W/ INTRAOCULAR LENS  IMPLANT, BILATERAL Bilateral    COLONOSCOPY     ESOPHAGOGASTRODUODENOSCOPY     KNEE ARTHROSCOPY Right 2007   KNEE ARTHROSCOPY  07/13/2011   Procedure: ARTHROSCOPY KNEE;  Surgeon: Erasmo Leventhal;  Location: [facility];  Service: Orthopedics;  Laterality: Left;  partial menisectomy with chondrylplasty   KNEE SURGERY Left    LAMINECTOMY AND MICRODISCECTOMY LUMBAR SPINE  MARCH  2008   L3 -  4   LAPAROSCOPIC INCISIONAL / UMBILICAL / VENTRAL HERNIA REPAIR  2006   PACEMAKER IMPLANT N/A 08/02/2018   Procedure: PACEMAKER IMPLANT;  Surgeon: Marinus Maw, MD;  Location: [facility];  Service: Cardiovascular;  Laterality: N/A;   PROSTATE BIOPSY     RADIOACTIVE SEED IMPLANT N/A 02/12/2019   Procedure: RADIOACTIVE SEED IMPLANT/BRACHYTHERAPY IMPLANT;  Surgeon: Marcine Matar, MD;  Location: [facility];  Service: Urology;  Laterality: N/A;  90 MINS   SHOULDER ARTHROSCOPY Right 05-23-2007   SIGMOID COLECTOMY FOR CANCER  1989   SPACE OAR INSTILLATION N/A 02/12/2019   Procedure: SPACE OAR INSTILLATION;  Surgeon: Marcine Matar, MD;  Location: [facility];  Service: Urology;  Laterality: N/A;   TOTAL KNEE ARTHROPLASTY Left 05/23/2015   Procedure: LEFT TOTAL KNEE ARTHROPLASTY;  Surgeon: Eugenia Mcalpine, MD;  Location: [facility];  Service: Orthopedics;  Laterality: Left;   VERTICAL BANDED GASTROPLASTY  1986   VIDEO BRONCHOSCOPY N/A 10/30/2019   Procedure: VIDEO BRONCHOSCOPY WITH  BRONICAL ALVEROLAR LAVAGE WITHOUT FLUORO;  Surgeon: [name], DO;  Location: [facility];  Service: Endoscopy;  Laterality: N/A;    Family History  Problem Relation Age of Onset   Stroke Paternal Grandfather    Heart disease Paternal Grandfather    Esophagitis Father        died from perforated esophagus   Breast cancer Mother    Colon cancer Maternal Grandmother        questionable   Esophageal cancer Neg Hx    Prostate cancer Neg Hx    Stomach cancer Neg Hx     Social History:  reports that he quit smoking about 48 years ago. His smoking use included cigarettes. He has a 40.00 pack-year smoking history. He has never used smokeless tobacco. He reports that he does not currently use alcohol after a past usage of about 30.0 standard drinks of alcohol per week. He reports that he does not use drugs.  Allergies:  Allergies  Allergen Reactions   Contrast Media [Iodinated Contrast Media] Palpitations    TACHYCARDIA- patient states he has tolerated newer agents since this reaction >30 yrs ago    No current facility-administered medications on file prior to encounter.   Current Outpatient Medications on File Prior to Encounter  Medication Sig Dispense Refill   allopurinol (ZYLOPRIM) 300 MG tablet Take 1 tablet (300 mg total) by mouth daily. 90 tablet 3   amLODipine (NORVASC) 10 MG tablet Take 1 tablet (10 mg total) by mouth daily. 90 tablet 3   Calcium Carb-Cholecalciferol (CALCIUM 600 + D PO) Take 1 tablet by mouth daily. 800 units of vitamin D     hydrOXYzine (ATARAX) 50 MG tablet Take 1 tablet (50 mg total) by mouth 3 (three) times daily as needed for itching. 90 tablet 0   Multiple Vitamin (MULTIVITAMIN) tablet Take 1 tablet by mouth in the morning.     phytonadione (VITAMIN K) 5 MG tablet Take 1 tablet by mouth NOW. 1 tablet 0   sodium chloride HYPERTONIC 3 % nebulizer solution Take by nebulization as needed for other. 750 mL 12   tamsulosin (FLOMAX) 0.4 MG CAPS capsule Take  0.4 mg by mouth daily.     warfarin (COUMADIN) 5 MG tablet Take 1 tablet (5 mg total) by mouth daily. 90 tablet 3   Respiratory Therapy Supplies (FLUTTER) DEVI 1 each by Does not apply route in the morning and at bedtime. 1 each 0   valsartan-hydrochlorothiazide (DIOVAN-HCT) 320-25 MG tablet TAKE 1 TABLET BY MOUTH DAILY. (Patient taking differently: Take 1 tablet by mouth daily.) 90 tablet 3    Review of Systems: A full ROS was attempted today and was  able to be performed.  Systems assessed include - Constitutional, Eyes, HENT, Respiratory, Cardiovascular, Gastrointestinal, Genitourinary, Integument/breast, Hematologic/lymphatic, Musculoskeletal, Neurological, Behavioral/Psych, Endocrine, Allergic/Immunologic - with pertinent responses as per HPI.  Physical Examination: Temp:  [97.5 F (36.4 C)-98.2 F (36.8 C)] 97.5 F (36.4 C) (06/10 1559) Pulse Rate:  [50-92] 64 (06/10 1559) Resp:  [9-22] 16 (06/10 1559) BP: (  97-140)/(45-89) 140/89 (06/10 1559) SpO2:  [96 %-100 %] 98 % (06/10 1559) Weight:  [96.6 kg] 96.6 kg (06/10 1254)  General - well nourished, well developed, in no apparent distress  Mental Status -  Level of arousal and orientation to time, place, and person were intact. Language including expression, naming, repetition, comprehension, reading, and writing was assessed and found intact Attention span and concentration were normal Recent and remote memory were intact Fund of Knowledge was assessed and was intact  Cranial Nerves II - XII - II - Vision intact OU.  No visual field cut III, IV, VI -he had binocular diplopia on right lateral gaze with inability to bury right sclera.  No gaze deviation.  Cross midline.  No nystagmus noted. V - Facial sensation intact bilaterally. VII - Facial movement intact bilaterally.  Mild right nasolabial flattening VIII - Hearing & vestibular intact bilaterally. X - Palate elevates symmetrically. XI - Chin turning & shoulder shrug intact  bilaterally. XII - Tongue protrusion intact.  Motor Strength - The patient's strength was normal in all extremities and pronator drift was absent  Motor Tone & Bulk - Muscle tone was assessed at the neck and appendages and was normal.  Bulk was normal and fasciculations were absent.   Reflexes - The patient's reflexes were normal in all extremities and he had no pathological reflexes.  Sensory - Light touch, temperature/pinprick were assessed and were normal.    Coordination - The patient had normal movements in the hands and feet with no ataxia or dysmetria.  Tremor was absent.  Gait and Station - deferred  NIH stroke scale of 2 for diplopia and mild right facial droop  Data Reviewed: CT HEAD WO CONTRAST ( )  Result Date: 12/13/2022 CLINICAL DATA:  Vision loss, double vision EXAM: CT HEAD WITHOUT CONTRAST TECHNIQUE: Contiguous axial images were obtained from the base of the skull through the vertex without intravenous contrast. RADIATION DOSE REDUCTION: This exam was performed according to the departmental dose-optimization program which includes automated exposure control, adjustment of the mA and/or kV according to patient size and/or use of iterative reconstruction technique. COMPARISON:  03/15/2022 CT maxillofacial, 05/27/2020 CT head FINDINGS: Brain: No evidence of acute infarction, hemorrhage, mass, mass effect, or midline shift. No hydrocephalus or extra-axial fluid collection. Vascular: No hyperdense vessel. Skull: Negative for fracture or focal lesion. Sinuses/Orbits: Mucosal thickening and mucous retention cysts in the maxillary sinuses. Mild mucosal thickening in the ethmoid air cells and right frontal sinus. Status post bilateral lens replacements. Other: The mastoid air cells are well aerated. IMPRESSION: No acute intracranial process. Electronically Signed   By: Wiliam Ke M.D.   On: 12/13/2022 17:59   CT ABDOMEN PELVIS W WO CONTRAST  Result Date: 12/03/2022 CLINICAL DATA:   Unintended weight loss. Elevated liver enzymes. EXAM: CT ABDOMEN AND PELVIS WITHOUT AND WITH CONTRAST TECHNIQUE: Multidetector CT imaging of the abdomen and pelvis was performed following the standard protocol before and following the bolus administration of intravenous contrast. Technologist notes state no immediate contrast reaction. RADIATION DOSE REDUCTION: This exam was performed according to the departmental dose-optimization program which includes automated exposure control, adjustment of the mA and/or kV according to patient size and/or use of iterative reconstruction technique. CONTRAST:  80mL OMNIPAQUE IOHEXOL 300 MG/ML  SOLN COMPARISON:  CT 03/01/2010 FINDINGS: Lower chest: Pacemaker wires are partially included. Eventration of the left hemidiaphragm. Mild bibasilar atelectasis. No basilar nodule or focal airspace disease. Hepatobiliary: There is intrahepatic biliary ductal dilatation. The proximal aspect of  the common bile duct is also dilated. The gallbladder is also moderately distended. Suspected pancreatic mass which is not well-defined by CT. Multiple hypodense liver lesions are well circumscribed, majority of which are 10 mm or less, however many are new from remote prior CT. The largest lesion is in the right lobe measuring 2.8 cm and measures simple fluid density. Layering stones or sludge in the gallbladder. Pancreas: Suspected pancreatic mass within the proximal body, not well-defined by CT, series 7, image 28. Potential mass measures approximately 2 cm. Relationship to the adjacent superior mesenteric artery and veins is difficult to define on the current exam, however there is likely mass effect on the superior aspect of the superior mesenteric vein. Ductal dilatation distal to this of 9 mm. There are at least 2 cystic lesions within the pancreatic tail, both measuring 15 mm, on series 7, image 29. No definite peripancreatic inflammation. Spleen: Normal in size without focal abnormality.  Adrenals/Urinary Tract: No adrenal nodule. No hydronephrosis or perinephric edema. Homogeneous renal enhancement with symmetric excretion on delayed phase imaging. Low-density lesions in the left kidney are too small to accurately characterize but favored to represent small cysts. No specific imaging follow-up is needed. Urinary bladder is physiologically distended without wall thickening. Stomach/Bowel: Prior bariatric surgery, presumed gastric bypass. The excluded gastric remnant is distended with contrast consistent with gastric gastric fistula. No small bowel obstruction or inflammation. The appendix is normal. Moderate colonic stool burden. Enteric sutures in the sigmoid. Occasional left colonic diverticula but no diverticulitis. No obvious colonic or bowel mass. Vascular/Lymphatic: Aortic atherosclerosis. No aortic aneurysm. The portal and splenic veins are patent. There may be slight mass effect on the superior aspect of the superior mesenteric vein but no evidence of venous invasion. No definite peripancreatic or abdominopelvic adenopathy. Reproductive: Brachytherapy seeds in the prostate. Other: No ascites. No abdominopelvic collection. Postsurgical change of the anterior abdominal wall with mesh in the upper abdomen. No granuloma in the anterior abdominal wall. Trace fat in both inguinal canals. Musculoskeletal: The bones are under mineralized. Scoliosis and diffuse degenerative change in the spine. No evidence of focal bone lesion or acute osseous findings. IMPRESSION: 1. Suspected pancreatic mass within the proximal body, not well-defined by CT. Potential mass measures approximately 2 cm. Relationship to the adjacent superior mesenteric artery and veins is difficult to define on the current exam, however there is likely mass effect on the superior aspect of the superior mesenteric vein. Recommend pancreatic protocol MRI for further evaluation, if pacemaker is MRI compatible. 2. Biliary and pancreatic  ductal dilatation. 3. Multiple hypodense liver lesions, majority of which are 10 mm or less, however many are new from remote prior CT. Lesions are indeterminate, but given the are new, suspicious for metastatic disease in the setting. 4. Prior bariatric surgery, presumed gastric bypass. The excluded gastric remnant is distended with contrast consistent with gastro gastric fistula. When compared with 2018 upper GI, this is chronic. 5. Two cystic lesions within the pancreatic tail measuring 15 mm, nonspecific. 6. Layering stones or sludge in the gallbladder. Aortic Atherosclerosis (ICD10-I70.0). These results will be called to the ordering clinician or representative by the Radiologist Assistant, and communication documented in the PACS or [employer]. Electronically Signed   By: Narda Rutherford M.D.   On: 12/03/2022 19:29   DG Chest 2 View  Result Date: 11/29/2022 CLINICAL DATA:  Unintentional weight loss EXAM: CHEST - 2 VIEW COMPARISON:  04/01/2022; 06/23/2020; chest CT-09/27/2019 FINDINGS: Unchanged cardiac silhouette and mediastinal contours with partial  obscuration of the left heart border secondary to known left anterior diaphragmatic hernia, demonstrated on remote chest CT performed 09/2019. There is mild elevation/eventration of the right hemidiaphragm, unchanged. No focal airspace opacities. No pleural effusion or pneumothorax. No evidence of edema. No acute osseous abnormalities. 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 Presumed postoperative deformity of the distal end of the right clavicle. IMPRESSION: Stable exam without superimposed acute cardiopulmonary disease. Electronically Signed   By: Simonne Come M.D.   On: 11/29/2022 15:41   CUP PACEART REMOTE DEVICE CHECK  Result Date: 11/16/2022 Scheduled remote reviewed. Normal device function.  AF/AFL identified 4/2 in office device check, Warfarin per EPIC.  No new stored events Next remote 91 days. LA, CVRS    Imaging: CT head: Negative for bleeding or mass.   82 y.o. Caucasian male with PMH of atrial fibrillation on Coumadin, hypertension pacemaker placement, GERD, prostate cancer, history of bariatric surgery, pancreatic mass found on CT 5/21.  He is off Coumadin for his ERCP which was completed today postprocedure he had complained of diplopia after the procedure but did not complain to the nurse and around 4 PM.  He is unable to get anticoagulation post ERCP procedure per GI.   Not a TNK candidate due to low NIHSS and recent procedure.  No indication for LVO.  Will complete stroke workup.  Plan: - HgbA1c, fasting lipid panel-recently completed -Aspirin stat if okay from GI standpoint if not resume anticoagulation tomorrow as scheduled.(Aspirin given to bridge until [facility] resumed) -Unable to get MRI due to pacemaker will need to double check with radiology if not repeat CT in 24 hours. -CTA head and neck - PT consult, OT consult, Speech consult - Echocardiogram -Statin for LDL less than 70. - Risk factor modification - Telemetry monitoring - Frequent neuro checks  - Stroke team to follow  Thank you for this consultation and allowing [place] to participate in the care of this patient.   Discussed with Dr. Alanda Slim and Dr. Leone Payor.

## 2022-12-13 NOTE — Anesthesia Preprocedure Evaluation (Addendum)
Anesthesia Evaluation  Patient identified by MRN, date of birth, ID band Patient awake    Reviewed: Allergy & Precautions, NPO status , Patient's Chart, lab work & pertinent test results  Airway Mallampati: III  TM Distance: >3 FB Neck ROM: Full    Dental  (+) Teeth Intact, Dental Advisory Given, Caps,    Pulmonary sleep apnea and Continuous Positive Airway Pressure Ventilation , former smoker   Pulmonary exam normal breath sounds clear to auscultation       Cardiovascular hypertension, Pt. on medications + CAD  + dysrhythmias Atrial Fibrillation + pacemaker  Rhythm:Irregular Rate:Abnormal     Neuro/Psych  Neuromuscular disease    GI/Hepatic hiatal hernia,GERD  ,,Pancreatic mass   Endo/Other  negative endocrine ROS    Renal/GU negative Renal ROS     Musculoskeletal  (+) Arthritis ,    Abdominal   Peds  Hematology  (+) Blood dyscrasia, anemia   Anesthesia Other Findings Day of surgery medications reviewed with the patient.  Reproductive/Obstetrics                             Anesthesia Physical Anesthesia Plan  ASA: 3  Anesthesia Plan: General   Post-op Pain Management: Minimal or no pain anticipated   Induction: Intravenous  PONV Risk Score and Plan: 2 and Treatment may vary due to age or medical condition, Dexamethasone and Ondansetron  Airway Management Planned: Oral ETT  Additional Equipment:   Intra-op Plan:   Post-operative Plan: Extubation in OR  Informed Consent: I have reviewed the patients History and Physical, chart, labs and discussed the procedure including the risks, benefits and alternatives for the proposed anesthesia with the patient or authorized representative who has indicated his/her understanding and acceptance.     Dental advisory given  Plan Discussed with: CRNA  Anesthesia Plan Comments:         Anesthesia Quick Evaluation

## 2022-12-13 NOTE — Op Note (Addendum)
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 Estimated Blood Loss:     Estimated blood loss was minimal. Procedure:                Pre-Anesthesia Assessment:                           - Prior to the procedure, a History and Physical                            was performed, and patient medications and                            allergies were reviewed. The patient's tolerance of                            previous anesthesia was also reviewed. The risks                            and benefits of the procedure and the sedation                            options and risks were discussed with the patient.                            All questions were answered, and informed consent                            was obtained. Prior Anticoagulants: The patient                            last took Coumadin (warfarin) 5 days prior to the                            procedure. ASA Grade Assessment: III - A patient  with severe systemic disease. After reviewing the                            risks and benefits, the patient was deemed in                            satisfactory condition to undergo the procedure.                           After obtaining informed consent, the scope was                            passed under direct vision. Throughout the                             procedure, the patient's blood pressure, pulse, and                            oxygen saturations were monitored continuously. The                            [employer] D single use                            duodenoscope was introduced through the mouth, and                            used to inject contrast into and used to inject                            contrast into the bile duct. The [id number]-Q190V                            ([id number]) Olympus duodenoscope was introduced                            through the mouth, and used to inject contrast into                            and used to inject contrast into the bile duct. The                            [id number]-H190 ([id number]) Olympus endoscope was introduced                            through the and used to inject contrast into. The                            ERCP was technically difficult and complex due to                            post-surgical anatomy. The patient tolerated the  procedure well. Scope In: Scope Out: Findings:      The scout film was normal. There were metal sutures from prior gastric       stapling seen. Originally used Exalt scope - was able to pass into       stomach and across mid-gastric stenosis from pror stapling but could not       enter duodenum. Exchanged for gastroscope - esophagus normal, mild scope       trauma at GE junction and mid-gastric stenosis. Passed to duodenum ok.       Then used Olympus duodenoscope and was able to see and access a normall       papilla though semi-long position needed due to anatomic distortion.       Bile duct cannulated via wire through sphincterotome - distal CBD       stricture seen - about 3 cm long. Contrassed filled out very dilated CHD       but limited filling of intrahepatics. Tried to pass a 12-15 mm       extraction balloon but would not pass papilla. (some bile was draining       after inital cannulation).  So I then performed moderate biliary       sphincterotomy w/o bleeding and increased bile flow ensued. Then used       12-15 mm balloon and occlusion cholangiogram performed showing markedly       dilated intrahepatics also and distorted anatomy so diffiicult to       comment further. There was a suspected air bubble in the strictured CBD.       Cytology brushings x 2 obtained from CBD and then a 5 cm 10 Fr plastic       biliary stent placed w/ copious flow dark bile. No pancreatogram by       intent. Impression:               - A single localized biliary stricture was found in                            the common bile duct. The stricture was malignant                            appearing. This was aspirated and the fluid was                            sent for cytology. This stricture was treated with                            stent placement. Recommendation:           - Return to flood and observe overnight. I stopped                            cholestyramine, hopefully this will alleviate                            pruritis                           I have consulted pharmacy re: Lovenox which should  be next anti-coagulant if we think he should                            continue and would not start that until tomorrow                           - I think he may need a liver biopsy in near future                            unless cytology confirms the suspected pancreatic                            cancer diagnosis.                           If ok in AM then regular diet and home - I will f/u                            cytology and coordinate next steps                           If/when he needs repeat ERCP use Olympus                            duodenoscope                           Would get oncology appt please Procedure Code(s):        --- Professional ---                           [phone], Endoscopic retrograde                             cholangiopancreatography (ERCP); with placement of                            endoscopic stent into biliary or pancreatic duct,                            including pre- and post-dilation and guide wire                            passage, when performed, including sphincterotomy,                            when performed, each stent                           43262, 59, Endoscopic retrograde                            cholangiopancreatography (ERCP); with                            sphincterotomy/papillotomy  43235, 59, Esophagogastroduodenoscopy, flexible,                            transoral; diagnostic, including collection of                            specimen(s) by brushing or washing, when performed                            (separate procedure) Diagnosis Code(s):        --- Professional ---                           K83.1, Obstruction of bile duct                           R17, Unspecified jaundice                           C25.1, Malignant neoplasm of body of pancreas CPT copyright 2022 American [employer]. All rights reserved. The codes documented in this report are preliminary and upon coder review may  be revised to meet current compliance requirements. Iva Boop, MD 12/13/2022 2:56:36 PM This report has been signed electronically. Number of Addenda: 0

## 2022-12-13 NOTE — Progress Notes (Signed)
ANTICOAGULATION CONSULT NOTE - Initial Consult  Pharmacy Consult for Lovenox while Warfarin held Indication: atrial fibrillation  Allergies  Allergen Reactions   Contrast Media [Iodinated Contrast Media] Palpitations    TACHYCARDIA- patient states he has tolerated newer agents since this reaction >30 yrs ago    Patient Measurements: Height: 5\' 8"  (172.7 cm) Weight: 96.6 kg (212 lb 15.4 oz) IBW/kg (Calculated) : 68.4  Vital Signs: Temp: 97.8 F (36.6 C) (06/10 1444) Temp Source: Temporal (06/10 1444) BP: 113/62 (06/10 1500) Pulse Rate: 71 (06/10 1500)  Labs: Recent Labs    12/11/22 0047 12/12/22 0056 12/13/22 0047 12/13/22 1127  HGB 11.8* 11.8* 12.3*  --   HCT 35.4* 35.6* 37.3*  --   PLT 213 201 211  --   LABPROT 23.8* 25.1* 27.1* 22.9*  INR 2.1* 2.2* 2.5* 2.0*  CREATININE 1.20 1.18 1.24  --     Estimated Creatinine Clearance: 51.8 mL/min (by C-G formula based on SCr of 1.24 mg/dL).   Medical History: Past Medical History:  Diagnosis Date   Acute meniscal tear of knee left   Arthritis    generalized   AV block 08/01/2018   Benign essential hypertension 01/18/2007   Bronchiectasis with acute exacerbation 06/23/2020   Chest pain, atypical 08/23/2014   Chronic cough 10/07/2014   Coronary artery disease    Degeneration of lumbar intervertebral disc 10/14/2017   Diverticulosis of colon 07/07/2005   Elevated PSA 06/19/2014   GERD (gastroesophageal reflux disease) 01/18/2007   Gout, unspecified 01/18/2007   Hemorrhoids 01/19/2011   Hiatal hernia    History of colonic polyps 01/18/2007   Insomnia, unspecified 08/21/2007   Iron deficiency anemia, unspecified  01/19/2011   Lumbar post-laminectomy syndrome 10/14/2017   Lumbar spondylolysis    Lumbar strain 12/14/2011   Obstructive sleep apnea 01/18/2007   uses CPAP nightly   Paraesophageal hernia    large   Primary osteoarthritis of knee 05/23/2015   Prostate cancer (HCC) 2016   S/P knee replacement  05/23/2015   Sensorineural hearing loss, bilateral    Vitamin B12 deficiency    Vitamin D deficiency 10/28/2009    Medications:  Scheduled:   [MAR Hold] allopurinol  300 mg Oral Daily   [MAR Hold] amLODipine  10 mg Oral Daily   [MAR Hold] camphor-menthol   Topical Daily   [MAR Hold] cholestyramine  4 g Oral BID   indomethacin  100 mg Rectal Once   [MAR Hold] irbesartan  300 mg Oral Daily   [MAR Hold] sodium chloride flush  3 mL Intravenous Q12H   [MAR Hold] tamsulosin  0.4 mg Oral Daily   Infusions:   sodium chloride 20 mL/hr at 12/11/22 0624   Ampicillin-Sulbactam (UNASYN) 3 g in sodium chloride 0.9 % 100 mL IVPB     lactated ringers Stopped (12/13/22 1447)   PRN: [ZOX Hold] acetaminophen **OR** [MAR Hold] acetaminophen, [MAR Hold] camphor-menthol, diclofenac, fentaNYL (SUBLIMAZE) injection, [MAR Hold] hydrALAZINE, [MAR Hold] hydrOXYzine, Omnipaque 300 mg/mL in 0.9% normal saline, ondansetron (ZOFRAN) IV, [MAR Hold] polyethylene glycol  Assessment: 82 yo male with a hx afib on chronic warfarin admitted with obstructive jaundice d/t pancreatic mass. INR>10 on admit> 2.5>FFP+ IV Vit K 2mg  >> INR 2.0  Now s/p ERCP, Pharmacy consulted to dose Lovenox while Warfarin is on hold for possible liver biopsy in the near future.  Goal of Therapy:  Anti-Xa level 0.6-1 units/ml 4hrs after LMWH dose given Monitor platelets by anticoagulation protocol: Yes   Plan:  Lovenox 1mg /kg (95mg ) SQ q12h  starting 6/11 per d/w Dr. Leone Payor Monitor CBC at least q48h - check in AM 6/11  Loralee Pacas, PharmD, BCPS 12/13/2022,3:08 PM  Please check AMION for all [facility] Pharmacy phone numbers After 10:00 PM, call Main Pharmacy [phone]

## 2022-12-13 NOTE — Anesthesia Procedure Notes (Signed)
Procedure Name: Intubation Date/Time: 12/13/2022 1:30 PM  Performed by: Dorie Rank, CRNAPre-anesthesia Checklist: Patient identified, Emergency Drugs available, Suction available, Patient being monitored and Timeout performed Patient Re-evaluated:Patient Re-evaluated prior to induction Oxygen Delivery Method: Circle system utilized Preoxygenation: Pre-oxygenation with 100% oxygen Induction Type: IV induction Ventilation: Mask ventilation without difficulty Laryngoscope Size: Mac and 4 Grade View: Grade II Tube type: Oral Number of attempts: 1 Airway Equipment and Method: Stylet Placement Confirmation: breath sounds checked- equal and bilateral and positive ETCO2 Secured at: 22 cm Tube secured with: Tape Dental Injury: Teeth and Oropharynx as per pre-operative assessment

## 2022-12-13 NOTE — Progress Notes (Signed)
Subjective Received secure chat message from RN about new onset double vision.  It seems like patient had double vision after ERCP. he was evaluated by GI and did not have objective diplopia at that time.  Patient reports diplopia, seeing things side-by-side about 1 feet apart on the wound.  Diplopia resolves with covering 1 eye.  He denies eye pain, slurred speech, focal weakness or numbness.  Has some nausea.  Family at bedside. Patient was given fresh frozen plasma and IV vitamin K 2 mg earlier this morning for elevated INR before ERCP.  He has pacemaker.  Per daughter, pacemaker is not compatible with MRI.  Vitals:   12/13/22 1500 12/13/22 1512 12/13/22 1522 12/13/22 1559  BP: 113/62 (!) 124/51 97/66 (!) 140/89  Pulse: 71 69 92 64  Resp: 20 (!) 9 12 16   Temp:    (!) 97.5 F (36.4 C)  TempSrc:    Oral  SpO2: 100% 99% 98% 98%  Weight:      Height:       Objective Awake, alert and oriented appropriately. Speech clear.  Binocular diplopia and resolves with covering 1 eye.  Pinpoint pupil bilaterally.  Extraocular eye movements grossly intact.  No facial asymmetry.  Motor 5/5 in all muscle groups of UE and LE bilaterally, Normal tone. Light sensation intact in all dermatomes of upper and lower ext bilaterally. Patellar reflex symmetric.  No pronator drift.  Finger to nose intact.  Assessment and plan Binocular diplopia: Last known normal before he went for ERCP about 1 PM.  Patient was on warfarin for A-fib.  Received vitamin K and FFP for INR reversal before ERCP.  Exam with binocular diplopia that resolves with covering 1 eye.  Extraocular movements seems to be intact.  He has pinpoint pupils bilaterally likely from opiate.  It seems like he received 2 doses of fentanyl about 1:30 PM.  No other focal neurodeficit.  He has pacemaker.  Per daughter, not compatible with MRI -Stat CT head -Neuro consulted -Check INR -Frequent neurocheck -Will check with RT if PPM is MRI compatible.  CRITICAL  CARE Performed by: Almon Hercules   Total critical care time: 30 minutes  Critical care time was exclusive of separately billable procedures and treating other patients.  Critical care was necessary to treat or prevent imminent or life-threatening deterioration.  Critical care was time spent personally by me on the following activities: development of treatment plan with patient and/or surrogate as well as nursing, discussions with consultants, evaluation of patient's response to treatment, examination of patient, obtaining history from patient or surrogate, ordering and performing treatments and interventions, ordering and review of laboratory studies, ordering and review of radiographic studies, pulse oximetry and re-evaluation of patient's condition.

## 2022-12-13 NOTE — TOC Initial Note (Addendum)
Transition of Care (TOC) - Initial/Assessment Note   Spoke to patient's two daughters and grand daughters at bedside. Patient just went for procedure.   Patient from home with adult son. Has a cane that he uses when needed.   PCP is Wellsite geologist at [place]  Patient goes to [place]. Called April at [facility] message.   [facility] Dr Hulda Humphrey , SW is [facility] [phone] ext [phone] . Left Ms Hollaway a message   Ms [facility] returned call Dr Hulda Humphrey fax is [phone] clinicals faxed.  Patient Details  Name: Marcus [name] MRN: [id number] Date of Birth: 30-Sep-1940  Transition of Care [facility]) CM/SW Contact:    Kingsley Plan, RN Phone Number: 12/13/2022, 12:40 PM  Clinical Narrative:                   Expected Discharge Plan: Home/Self Care Barriers to Discharge: Continued Medical Work up   Patient Goals and CMS Choice            Expected Discharge Plan and Services   Discharge Planning Services: CM Consult                     DME Arranged: N/A         HH Arranged: NA          Prior Living Arrangements/Services   Lives with:: Adult Children              Current home services: DME    Activities of Daily Living Home Assistive Devices/Equipment: None ADL Screening (condition at time of admission) Patient's cognitive ability adequate to safely complete daily activities?: Yes Is the patient deaf or have difficulty hearing?: No Does the patient have difficulty seeing, even when wearing glasses/contacts?: No Does the patient have difficulty concentrating, remembering, or making decisions?: No Patient able to express need for assistance with ADLs?: No Does the patient have difficulty dressing or bathing?: No Independently performs ADLs?: Yes (appropriate for developmental age) Does the patient have difficulty walking or climbing stairs?: No Weakness of Legs: None Weakness of Arms/Hands: None  Permission Sought/Granted                   Emotional Assessment              Admission diagnosis:  Elevated INR [R79.1] Elevated LFTs [R79.89] 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) [K83.1, C80.1] Patient Active Problem List   Diagnosis Date Noted   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) 12/10/2022   Pancreatic mass 12/10/2022   Status post bariatric surgery 12/10/2022   Elevated LFTs 12/10/2022   Atrial fibrillation (HCC) 10/11/2022   Long term (current) use of anticoagulants 10/11/2022   PND (post-nasal drip) 05/03/2022   Bronchiectasis without complication (HCC) 04/03/2022   Coronary artery disease 12/25/2020   Vitamin B12 deficiency 12/25/2020   Sensorineural hearing loss, bilateral    Cardiac pacemaker in situ 03/26/2020   AV block 08/01/2018   Degeneration of lumbar intervertebral disc 10/14/2017   Lumbar post-laminectomy syndrome 10/14/2017   Primary osteoarthritis of knee 05/23/2015   S/P knee replacement 05/23/2015   Hemorrhoids 01/19/2011   Vitamin D deficiency 10/28/2009   Obesity 10/17/2007   Gout, unspecified 01/18/2007   Obstructive sleep apnea 01/18/2007   Benign essential hypertension 01/18/2007   GERD (gastroesophageal reflux disease) 01/18/2007   History of malignant neoplasm of colon 01/18/2007   History of colonic polyps 01/18/2007   Hiatal hernia  10/26/2006   Diverticulosis of colon 07/07/2005   PCP:  [name], NP Pharmacy:   [facility] [phone] - [place], [place] - [address] [place] [address] Phone: [phone] Fax: [phone]  [facility] - [place], [place] - [address] [place] [address] Phone: [phone] Fax: [phone]  [facility] [place], [place] - [address] [place] [address] Phone: [phone] Fax: [phone]  [id number] [place], [place] - [address] AT [facility] [address] [place] [address] Phone: [phone] Fax: [phone]     Social Determinants of Health (SDOH) Social History: SDOH Screenings   Food Insecurity: No Food Insecurity (12/10/2022)  Housing: Low Risk  (12/10/2022)  Transportation Needs: No Transportation Needs (12/10/2022)  Utilities: Not At Risk (12/10/2022)  Alcohol Screen: Low Risk  (08/04/2020)  Depression (PHQ2-9): High Risk (11/30/2022)  Financial Resource Strain: Low Risk  (07/28/2021)  Physical Activity: Inactive (07/28/2021)  Social Connections: Moderately Isolated (08/04/2020)  Stress: No Stress Concern Present (07/28/2021)  Tobacco Use: Medium Risk (12/09/2022)   SDOH Interventions:     Readmission Risk Interventions     No data to display

## 2022-12-13 NOTE — Interval H&P Note (Signed)
History and Physical Interval Note:  12/13/2022 12:59 PM  Marcus [name]  has presented today for surgery, with the diagnosis of biliary obstruction/ jaundice, pancretic mass.  The various methods of treatment have been discussed with the patient and family. After consideration of risks, benefits and other options for treatment, the patient has consented to  Procedure(s) with comments: ENDOSCOPIC RETROGRADE CHOLANGIOPANCREATOGRAPHY (ERCP) (N/A) - with stent placement as a surgical intervention.  The patient's history has been reviewed, patient examined, no change in status, stable for surgery.  I have reviewed the patient's chart and labs.  Questions were answered to the patient's satisfaction.     Stan Head

## 2022-12-13 NOTE — Progress Notes (Signed)
   12/13/22 2018  BiPAP/CPAP/SIPAP  BiPAP/CPAP/SIPAP Pt Type Adult  Reason BIPAP/CPAP not in use Non-compliant

## 2022-12-13 NOTE — Progress Notes (Signed)
Remote pacemaker transmission.   

## 2022-12-13 NOTE — Progress Notes (Signed)
   Re-checking INR this AM to determine feasibility of ERCP today  Iva Boop, MD, [facility] See Loretha Stapler on call - gastroenterology for best contact person 12/13/2022 7:12 AM

## 2022-12-13 NOTE — Progress Notes (Signed)
PROGRESS NOTE  PER NOTARO ZOX:[id number] DOB: [birth year]   PCP: [name], NP  Patient is from: Home.  Independently ambulates at baseline.  DOA: 12/10/2022 LOS: 1  Chief complaints Chief Complaint  Patient presents with   Abnormal Lab     Brief Narrative / Interim history: 82 year old M with PMH of A-fib on warfarin, colon cancer s/p sigmoid colectomy in 1989, prostate cancer s/p local radiation in 2020 CAD, HTN, CHB/PPM, GERD, gout, obesity, OSA, bronchiectasis, prostate cancer and previous bariatric surgery presenting with abnormal labs (elevated INR, LFT, bilirubin) and pancreatic mass noted on outpatient CT from 5/31.  Patient was seen by PCP for ongoing weight loss and elevated LFT.  CT abdomen and pelvis on 5/21 showed pancreatic mass measuring about 2 cm, biliary and pancreatic ductal dilation and multiple liver lesions.  His INR was elevated to 10.  He was started on vitamin K and directed to ED for further evaluation.  In ED, AST 319, ALT 642, T. bili 7.1 (from 4.3 135) and ALP 687.  INR 2.9.  He was typed and screened.  GI consulted.  INR improved after vitamin K injection and FFP.  ERCP on 6/10 showed a single localized biliary structure in common bile duct concerning for malignancy.  This was aspirated and fluid was sent for cytology.  The stricture was treated with stent placement.  GI recommended using Lovenox if anticoagulation is indicated in the future.  Also recommended liver biopsy if cytology not revealing, and oncology appointment.    Subjective: Seen and examined earlier this morning before he went for ERCP.  No major events overnight of this morning.  Complaining of pruritus.  He has not been getting his hydroxyzine as prescribed.  Also felt intermittent burning pain in abdomen that lasts a second or two and resolves.  Objective: Vitals:   12/11/22 1557 12/11/22 2007 12/12/22 0353 12/12/22 0746  BP: (!) 155/61 (!) 148/73 116/64 (!) 149/99  Pulse: (!) 59  (!) 44 62 60  Resp: 16 18 19 17   Temp: 97.9 F (36.6 C) 97.8 F (36.6 C) 97.7 F (36.5 C) 98.3 F (36.8 C)  TempSrc: Oral Oral Oral Oral  SpO2: 97% 96% 97% 100%    Examination:  GENERAL: No apparent distress.  Nontoxic. HEENT: MMM.  Sclerae icteric NECK: Supple.  No apparent JVD.  RESP:  No IWOB.  Fair aeration bilaterally. CVS:  RRR. Heart sounds normal.  ABD/GI/GU: BS+. Abd soft, NTND.  MSK/EXT:  Moves extremities. No apparent deformity. No edema.  SKIN: Skin jaundice in face NEURO: Awake, alert and oriented appropriately.  No apparent focal neuro deficit. PSYCH: Calm. Normal affect.   Procedures:  6/10-ERCP showed a single localized biliary structure in common bile duct concerning for malignancy that was aspirated and sent for cytology.  The stricture was treated with stent placement.    Microbiology summarized: None  Assessment and plan: Principal Problem:   Biliary obstruction due to malignant neoplasm [facility]) Active Problems:   Gout, unspecified   Obesity   Obstructive sleep apnea   Benign essential hypertension   GERD (gastroesophageal reflux disease)   Cardiac pacemaker in situ   Coronary artery disease   Bronchiectasis without complication (HCC)   Atrial fibrillation (HCC)   Pancreatic mass   Status post bariatric surgery   Elevated LFTs  Obstructive jaundice likely due to pancreatic mass Liver lesions/elevated liver enzymes/hyperbilirubinemia -Saw PCP for ongoing weight loss and LFT. Hx of colon cancer and prostate cancer. -CT on 5/31 concerning for  2 cm pancreatic mass within the proximal body, biliary and pancreatic ductal dilation and multiple liver lesions concerning for malignancy. -6/10-ERCP showed a single localized biliary structure in CBD concerning for malignancy that was aspirated and sent for cytology.  The stricture was treated with stent placement.  Follow cytology -GI res: liver biopsy if cytology not revealing, Lovenox if anticoagulation  indicated in the future, and oncology appointment. -Continue trending LFTs and INR -Continue holding warfarin. -Okay to resume regular diet on 6/11 per GI   Chronic atrial fibrillation/complete heart block s/p PPM: Rate controlled.  INR 2.5 and improved to 2.0 after FFP and vitamin K injection. -Continue holding warfarin -Recheck INR in the morning  CAD: No cardiopulmonary symptoms. -Continue home valsartan as above -Holding warfarin as above   Hypertension -Continue home amlodipine and losartan -Discontinued HCTZ due to hypokalemia  Bronchiectasis -Not currently on any inhalers   Status post bariatric surgery -Noted   History of colon cancer s/p sigmoid colectomy in 1989 History of prostate cancer s/p radioactive seed implant in 2020   OSA on CPAP: -Encouraged to use CPAP.  Hypokalemia -Monitor replenish as appropriate    Gout -Continue home allopurinol  Pruritus: Likely due to hyperbilirubinemia.  Should improve after stent placement. -Cholestyramine discontinued -Hydroxyzine as needed  Obesity -Noted There is no height or weight on file to calculate BMI.           DVT prophylaxis:  Therapeutic INR  Code Status: Full code Family Communication: Updated patient's daughter's at bedside. Level of care: Telemetry Medical Status is: Inpatient The patient will remain to inpatient because: Obstructive jaundice   Final disposition: Home Consultants:  GI  35 minutes with more than 50% spent in reviewing records, counseling patient/family and coordinating care.   Sch Meds:  Scheduled Meds:  allopurinol  300 mg Oral Daily   amLODipine  10 mg Oral Daily   cholestyramine  4 g Oral BID   irbesartan  300 mg Oral Daily   potassium chloride  40 mEq Oral Q4H   sodium chloride flush  3 mL Intravenous Q12H   tamsulosin  0.4 mg Oral Daily   Continuous Infusions:  sodium chloride 20 mL/hr at 12/11/22 0624   PRN Meds:.acetaminophen **OR** acetaminophen,  hydrALAZINE, hydrOXYzine, polyethylene glycol  Antimicrobials: Anti-infectives (From admission, onward)    None        I have personally reviewed the following labs and images: CBC: Recent Labs  Lab 12/10/22 1444 12/11/22 0047 12/12/22 0056  WBC 5.8 5.2 5.3  HGB 12.1* 11.8* 11.8*  HCT 37.3* 35.4* 35.6*  MCV 90.8 89.2 92.2  PLT 242 213 201   BMP &GFR Recent Labs  Lab 12/10/22 1444 12/11/22 0047 12/12/22 0056  NA 139 139 136  K 3.9 3.5 3.3*  CL 106 105 106  CO2 25 23 21*  GLUCOSE 146* 126* 125*  BUN 20 20 23   CREATININE 1.23 1.20 1.18  CALCIUM 9.2 8.7* 8.6*  MG  --   --  2.1  PHOS  --   --  3.8   Estimated Creatinine Clearance: 55.2 mL/min (by C-G formula based on SCr of 1.18 mg/dL). Liver & Pancreas: Recent Labs  Lab 12/10/22 1444 12/11/22 0047 12/12/22 0056  AST 319* 264* 238*  ALT 642* 553* 504*  ALKPHOS 682* 643* 636*  BILITOT 7.1* 7.2* 8.7*  PROT 6.3* 5.6* 5.5*  ALBUMIN 2.9* 2.7* 2.5*   No results for input(s): "LIPASE", "AMYLASE" in the last 168 hours. No results for input(s): "AMMONIA"  in the last 168 hours. Diabetic: No results for input(s): "HGBA1C" in the last 72 hours. No results for input(s): "GLUCAP" in the last 168 hours. Cardiac Enzymes: No results for input(s): "CKTOTAL", "CKMB", "CKMBINDEX", "TROPONINI" in the last 168 hours. No results for input(s): "PROBNP" in the last 8760 hours. Coagulation Profile: Recent Labs  Lab 12/09/22 0957 12/09/22 1019 12/10/22 1444 12/11/22 0047 12/12/22 0056  INR 8.0* >10.0* 2.9* 2.1* 2.2*   Thyroid Function Tests: No results for input(s): "TSH", "T4TOTAL", "FREET4", "T3FREE", "THYROIDAB" in the last 72 hours. Lipid Profile: No results for input(s): "CHOL", "HDL", "LDLCALC", "TRIG", "CHOLHDL", "LDLDIRECT" in the last 72 hours. Anemia Panel: No results for input(s): "VITAMINB12", "FOLATE", "FERRITIN", "TIBC", "IRON", "RETICCTPCT" in the last 72 hours. Urine analysis:    Component Value  Date/Time   COLORURINE YELLOW 08/02/2020 1614   APPEARANCEUR CLEAR 08/02/2020 1614   LABSPEC 1.019 08/02/2020 1614   PHURINE 5.0 08/02/2020 1614   GLUCOSEU NEGATIVE 08/02/2020 1614   HGBUR NEGATIVE 08/02/2020 1614   BILIRUBINUR NEGATIVE 08/02/2020 1614   BILIRUBINUR n 10/10/2015 0919   KETONESUR NEGATIVE 08/02/2020 1614   PROTEINUR NEGATIVE 08/02/2020 1614   UROBILINOGEN 0.2 10/10/2015 0919   UROBILINOGEN 0.2 05/13/2015 0922   NITRITE NEGATIVE 08/02/2020 1614   LEUKOCYTESUR NEGATIVE 08/02/2020 1614   Sepsis Labs: Invalid input(s): "PROCALCITONIN", "LACTICIDVEN"  Microbiology: Recent Results (from the past 240 hour(s))  Surgical PCR screen     Status: None   Collection Time: 12/11/22  1:12 AM   Specimen: Nasal Mucosa; Nasal Swab  Result Value Ref Range Status   MRSA, PCR NEGATIVE NEGATIVE Final   Staphylococcus aureus NEGATIVE NEGATIVE Final    Comment: (NOTE) The Xpert SA Assay (FDA approved for NASAL specimens in patients 29 years of age and older), is one component of a comprehensive surveillance program. It is not intended to diagnose infection nor to guide or monitor treatment. Performed at [facility], [address] [place], [place] [address]     Radiology Studies: No results found.    Tira Lafferty T. Calab Sachse [facility]  If 7PM-7AM, please contact night-coverage [email or web address] 12/12/2022, 2:54 PM

## 2022-12-13 NOTE — Progress Notes (Signed)
Patient hd c/o double vision to RN, when I assessed did not have it (saw correct # fingers)

## 2022-12-13 NOTE — Transfer of Care (Signed)
Immediate Anesthesia Transfer of Care Note  Patient: Marcus [name]  Procedure(s) Performed: ENDOSCOPIC RETROGRADE CHOLANGIOPANCREATOGRAPHY (ERCP)  Patient Location: Endoscopy Unit  Anesthesia Type:General  Level of Consciousness: drowsy and patient cooperative  Airway & Oxygen Therapy: Patient Spontanous Breathing  Post-op Assessment: Report given to RN and Post -op Vital signs reviewed and stable  Post vital signs: Reviewed and stable  Last Vitals:  Vitals Value Taken Time  BP 109/45 12/13/22 1446  Temp 36.6 C 12/13/22 1444  Pulse 77 12/13/22 1449  Resp 13 12/13/22 1449  SpO2 98 % 12/13/22 1449  Vitals shown include unvalidated device data.  Last Pain:  Vitals:   12/13/22 1444  TempSrc: Temporal  PainSc: 0-No pain         Complications: No notable events documented.

## 2022-12-14 ENCOUNTER — Inpatient Hospital Stay (HOSPITAL_COMMUNITY): Payer: No Typology Code available for payment source

## 2022-12-14 ENCOUNTER — Other Ambulatory Visit: Payer: Self-pay | Admitting: *Deleted

## 2022-12-14 ENCOUNTER — Other Ambulatory Visit (HOSPITAL_COMMUNITY): Payer: Self-pay

## 2022-12-14 DIAGNOSIS — K8689 Other specified diseases of pancreas: Secondary | ICD-10-CM

## 2022-12-14 DIAGNOSIS — G459 Transient cerebral ischemic attack, unspecified: Secondary | ICD-10-CM

## 2022-12-14 DIAGNOSIS — I2510 Atherosclerotic heart disease of native coronary artery without angina pectoris: Secondary | ICD-10-CM | POA: Diagnosis not present

## 2022-12-14 DIAGNOSIS — K831 Obstruction of bile duct: Secondary | ICD-10-CM | POA: Diagnosis not present

## 2022-12-14 DIAGNOSIS — I480 Paroxysmal atrial fibrillation: Secondary | ICD-10-CM | POA: Diagnosis not present

## 2022-12-14 DIAGNOSIS — J479 Bronchiectasis, uncomplicated: Secondary | ICD-10-CM | POA: Diagnosis not present

## 2022-12-14 DIAGNOSIS — C801 Malignant (primary) neoplasm, unspecified: Secondary | ICD-10-CM | POA: Diagnosis not present

## 2022-12-14 DIAGNOSIS — R7989 Other specified abnormal findings of blood chemistry: Secondary | ICD-10-CM | POA: Diagnosis not present

## 2022-12-14 LAB — CBC
HCT: 34.3 % — ABNORMAL LOW (ref 39.0–52.0)
Hemoglobin: 11.4 g/dL — ABNORMAL LOW (ref 13.0–17.0)
MCH: 30.2 pg (ref 26.0–34.0)
MCHC: 33.2 g/dL (ref 30.0–36.0)
MCV: 91 fL (ref 80.0–100.0)
Platelets: 192 10*3/uL (ref 150–400)
RBC: 3.77 MIL/uL — ABNORMAL LOW (ref 4.22–5.81)
RDW: 17.7 % — ABNORMAL HIGH (ref 11.5–15.5)
WBC: 5.8 10*3/uL (ref 4.0–10.5)
nRBC: 0 % (ref 0.0–0.2)

## 2022-12-14 LAB — ECHOCARDIOGRAM COMPLETE
AR max vel: 2.78 cm2
AV Area VTI: 3.05 cm2
AV Area mean vel: 3.01 cm2
AV Mean grad: 6.5 mmHg
AV Peak grad: 12.5 mmHg
Ao pk vel: 1.77 m/s
Area-P 1/2: 2.17 cm2
Calc EF: 42.5 %
Height: 68 in
MV VTI: 3.38 cm2
S' Lateral: 4.1 cm
Single Plane A2C EF: 43.5 %
Single Plane A4C EF: 41.6 %
Weight: 3407.43 oz

## 2022-12-14 LAB — CEA: CEA: 6.4 ng/mL — ABNORMAL HIGH (ref 0.0–4.7)

## 2022-12-14 LAB — COMPREHENSIVE METABOLIC PANEL
ALT: 445 U/L — ABNORMAL HIGH (ref 0–44)
AST: 193 U/L — ABNORMAL HIGH (ref 15–41)
Albumin: 2.7 g/dL — ABNORMAL LOW (ref 3.5–5.0)
Alkaline Phosphatase: 696 U/L — ABNORMAL HIGH (ref 38–126)
Anion gap: 8 (ref 5–15)
BUN: 25 mg/dL — ABNORMAL HIGH (ref 8–23)
CO2: 23 mmol/L (ref 22–32)
Calcium: 9.1 mg/dL (ref 8.9–10.3)
Chloride: 106 mmol/L (ref 98–111)
Creatinine, Ser: 1.25 mg/dL — ABNORMAL HIGH (ref 0.61–1.24)
GFR, Estimated: 57 mL/min — ABNORMAL LOW (ref >60)
Glucose, Bld: 122 mg/dL — ABNORMAL HIGH (ref 70–99)
Potassium: 3.6 mmol/L (ref 3.5–5.1)
Sodium: 137 mmol/L (ref 135–145)
Total Bilirubin: 8.4 mg/dL — ABNORMAL HIGH (ref 0.3–1.2)
Total Protein: 5.5 g/dL — ABNORMAL LOW (ref 6.5–8.1)

## 2022-12-14 LAB — PREPARE FRESH FROZEN PLASMA: Unit division: 0

## 2022-12-14 LAB — BPAM FFP
Blood Product Expiration Date: 202406132359
ISSUE DATE / TIME: 202406100832
Unit Type and Rh: 600

## 2022-12-14 LAB — CANCER ANTIGEN 19-9: CA 19-9: 1667 U/mL — ABNORMAL HIGH (ref 0–35)

## 2022-12-14 LAB — PROTIME-INR
INR: 1.4 — ABNORMAL HIGH (ref 0.8–1.2)
Prothrombin Time: 17.2 seconds — ABNORMAL HIGH (ref 11.4–15.2)

## 2022-12-14 MED ORDER — GADOBUTROL 1 MMOL/ML IV SOLN
9.7000 mL | Freq: Once | INTRAVENOUS | Status: AC | PRN
  Administered 2022-12-14: 9.7 mL via INTRAVENOUS

## 2022-12-14 MED ORDER — ENOXAPARIN SODIUM 100 MG/ML IJ SOSY
100.0000 mg | PREFILLED_SYRINGE | Freq: Two times a day (BID) | INTRAMUSCULAR | Status: DC
  Administered 2022-12-14 – 2022-12-15 (×2): 100 mg via SUBCUTANEOUS
  Filled 2022-12-14 (×4): qty 1

## 2022-12-14 MED ORDER — CALCIUM CARBONATE ANTACID 500 MG PO CHEW
400.0000 mg | CHEWABLE_TABLET | Freq: Three times a day (TID) | ORAL | Status: DC
  Administered 2022-12-14 – 2022-12-15 (×4): 400 mg via ORAL
  Filled 2022-12-14 (×4): qty 2

## 2022-12-14 MED ORDER — PERFLUTREN LIPID MICROSPHERE
1.0000 mL | INTRAVENOUS | Status: AC | PRN
  Administered 2022-12-14: 3 mL via INTRAVENOUS

## 2022-12-14 MED ORDER — ADULT MULTIVITAMIN W/MINERALS CH
1.0000 | ORAL_TABLET | Freq: Every day | ORAL | Status: DC
  Administered 2022-12-14 – 2022-12-15 (×2): 1 via ORAL
  Filled 2022-12-14 (×2): qty 1

## 2022-12-14 MED ORDER — CALCIUM CARBONATE ANTACID 500 MG PO CHEW: 400.0000 mg | CHEWABLE_TABLET | Freq: Three times a day (TID) | ORAL | Status: DC

## 2022-12-14 NOTE — Anesthesia Postprocedure Evaluation (Signed)
Anesthesia Post Note  Patient: Marcus [name]  Procedure(s) Performed: ENDOSCOPIC RETROGRADE CHOLANGIOPANCREATOGRAPHY (ERCP) BILIARY BRUSHING BILIARY STENT PLACEMENT SPHINCTEROTOMY REMOVAL OF STONES     Patient location during evaluation: Endoscopy Anesthesia Type: General Level of consciousness: awake and alert Pain management: pain level controlled Vital Signs Assessment: post-procedure vital signs reviewed and stable Respiratory status: spontaneous breathing, nonlabored ventilation, respiratory function stable and patient connected to nasal cannula oxygen Cardiovascular status: blood pressure returned to baseline and stable Postop Assessment: no apparent nausea or vomiting Anesthetic complications: no   No notable events documented.  Last Vitals:  Vitals:   12/14/22 0419 12/14/22 0736  BP: (!) 127/55 134/60  Pulse: (!) 58 (!) 59  Resp: 20 17  Temp: 36.5 C 36.8 C  SpO2: 97% 100%    Last Pain:  Vitals:   12/14/22 0736  TempSrc: Oral  PainSc:    Pain Goal:                   Collene Schlichter

## 2022-12-14 NOTE — Progress Notes (Signed)
Oncology Discharge Planning Note  [facility] Address: [address], [place], [place] [address] Hours of Operation:  Lewayne Bunting, Monday - Friday  Clinic Contact Information:  [phone]) [phone]  Oncology Care Team: Medical Oncologist:  Truett Perna  Patient Details: Name:  Marcus [name], Marcus [name] MRN:   [id number] DOB:   [birth year] Reason for Current Admission: @PPROB @  Discharge Planning Narrative: Notification of admission received by inpatient team for [facility].  Discharge follow-up appointments for oncology are current and available on the AVS and MyChart.   Upon discharge from the hospital, hematology/oncology's post discharge plan of care for the outpatient setting is: December 20, 2022 at 1:40 pm [facility] [address] [place], [place] [address]  [phone]   Toni Hoffmeister will be called within two business days after discharge to review hematology/oncology's plan of care for full understanding.    Outpatient Oncology Specific Care Only: Oncology appointment transportation needs addressed?:  no Oncology medication management for symptom management addressed?:  no Chemo Alert Card reviewed?:  not applicable Immunotherapy Alert Card reviewed?:  not applicable

## 2022-12-14 NOTE — Progress Notes (Signed)
ANTICOAGULATION CONSULT NOTE - Initial Consult  Pharmacy Consult for Lovenox while Warfarin held Indication: atrial fibrillation  Allergies  Allergen Reactions   Contrast Media [Iodinated Contrast Media] Palpitations    TACHYCARDIA- patient states he has tolerated newer agents since this reaction >30 yrs ago    Patient Measurements: Height: 5\' 8"  (172.7 cm) Weight: 96.6 kg (212 lb 15.4 oz) IBW/kg (Calculated) : 68.4  Vital Signs: Temp: 98.2 F (36.8 C) (06/11 0736) Temp Source: Oral (06/11 0736) BP: 134/60 (06/11 0736) Pulse Rate: 59 (06/11 0736)  Labs: Recent Labs    12/12/22 0056 12/13/22 0047 12/13/22 1127 12/13/22 1727 12/14/22 0037  HGB 11.8* 12.3*  --   --  11.4*  HCT 35.6* 37.3*  --   --  34.3*  PLT 201 211  --   --  192  LABPROT 25.1* 27.1* 22.9* 19.3* 17.2*  INR 2.2* 2.5* 2.0* 1.6* 1.4*  CREATININE 1.18 1.24  --   --   --      Estimated Creatinine Clearance: 51.8 mL/min (by C-G formula based on SCr of 1.24 mg/dL).   Medical History: Past Medical History:  Diagnosis Date   Acute meniscal tear of knee left   Arthritis    generalized   AV block 08/01/2018   Benign essential hypertension 01/18/2007   Bronchiectasis with acute exacerbation 06/23/2020   Chest pain, atypical 08/23/2014   Chronic cough 10/07/2014   Coronary artery disease    Degeneration of lumbar intervertebral disc 10/14/2017   Diverticulosis of colon 07/07/2005   Elevated PSA 06/19/2014   GERD (gastroesophageal reflux disease) 01/18/2007   Gout, unspecified 01/18/2007   Hemorrhoids 01/19/2011   Hiatal hernia    History of colonic polyps 01/18/2007   Insomnia, unspecified 08/21/2007   Iron deficiency anemia, unspecified  01/19/2011   Lumbar post-laminectomy syndrome 10/14/2017   Lumbar spondylolysis    Lumbar strain 12/14/2011   Obstructive sleep apnea 01/18/2007   uses CPAP nightly   Paraesophageal hernia    large   Primary osteoarthritis of knee 05/23/2015   Prostate  cancer (HCC) 2016   S/P knee replacement 05/23/2015   Sensorineural hearing loss, bilateral    Vitamin B12 deficiency    Vitamin D deficiency 10/28/2009     Assessment: 82 yo male with a hx afib on chronic warfarin admitted with obstructive jaundice d/t pancreatic mass. INR >10 on admit, given FFP+ IV Vit K 2mg  with down to INR 1.4. Now s/p ERCP 6/10. Pharmacy consulted to dose Lovenox while Warfarin is on hold for possible liver biopsy in the near future.   Discussed with Neuro, ok to stop aspirin 325mg . Will round enoxaparin dose up for more accurate administration and clinical insignificant difference given < 5% from recommended dose of 96.6mg  q12hr.   Goal of Therapy:   Monitor platelets by anticoagulation protocol: Yes   Plan:  Lovenox 100mg  SQ q12h   Monitor renal function, CBC Monitor for signs/symptoms of bleeding   Alphia Moh, PharmD, BCPS, BCCP Clinical Pharmacist  Please check AMION for all [facility] Pharmacy phone numbers After 10:00 PM, call Main Pharmacy [phone]

## 2022-12-14 NOTE — Progress Notes (Signed)
PROGRESS NOTE  Marcus [name] ZOX:[id number] DOB: 04/29/1941   PCP: [name], NP  Patient is from: Home.  Independently ambulates at baseline.  DOA: 12/10/2022 LOS: 3  Chief complaints Chief Complaint  Patient presents with   Abnormal Lab     Brief Narrative / Interim history: 82 year old M with PMH of A-fib on warfarin, colon cancer s/p sigmoid colectomy in 1989, prostate cancer s/p local radiation in 2020 CAD, HTN, CHB/PPM, GERD, gout, obesity, OSA, bronchiectasis, prostate cancer and previous bariatric surgery presenting with abnormal labs (elevated INR, LFT, bilirubin) and pancreatic mass noted on outpatient CT from 5/31.  Patient was seen by PCP for ongoing weight loss and elevated LFT.  CT abdomen and pelvis on 5/21 showed pancreatic mass measuring about 2 cm, biliary and pancreatic ductal dilation and multiple liver lesions.  His INR was elevated to 10.  He was started on vitamin K and directed to ED for further evaluation.  In ED, AST 319, ALT 642, T. bili 7.1 (from 4.3 135) and ALP 687.  INR 2.9.  He was typed and screened.  GI consulted.  INR improved after vitamin K injection and FFP.  Underwent ERCP that showed biliary stricture that was biopsied and stented.  Cytology pending.  Patient developed binocular diplopia after ERCP.  CT head without acute finding. MRI brain, MRA and TTE pending.  Binocular diplopia resolved.  Neurology following.  Back on anticoagulation with Lovenox per GI recommendation in case cytology unrevealing and liver biopsy needed in the future.    Subjective: Seen and examined earlier this morning.  No major events overnight of this morning.  Feels better today.  Itching has almost gone.  Binocular diplopia resolved.  No focal neurosymptoms.  Tolerated diet.  Eager to go home.  Objective: Vitals:   12/13/22 2017 12/14/22 0007 12/14/22 0419 12/14/22 0736  BP: 127/67 (!) 112/55 (!) 127/55 134/60  Pulse: 63 (!) 57 (!) 58 (!) 59  Resp: 17 18 20 17    Temp: 97.7 F (36.5 C) 97.8 F (36.6 C) 97.7 F (36.5 C) 98.2 F (36.8 C)  TempSrc: Oral Oral Oral Oral  SpO2: 97% 96% 97% 100%  Weight:      Height:        Examination:  GENERAL: No apparent distress.  Nontoxic. HEENT: MMM.  Sclerae icteric NECK: Supple.  No apparent JVD.  RESP:  No IWOB.  Fair aeration bilaterally. CVS:  RRR. Heart sounds normal.  ABD/GI/GU: BS+. Abd soft, NTND.  MSK/EXT:  Moves extremities. No apparent deformity. No edema.  SKIN: Skin jaundice in face NEURO: Awake, alert and oriented appropriately.  No apparent focal neuro deficit. PSYCH: Calm. Normal affect.   Procedures:  6/10-ERCP showed a single localized biliary structure in common bile duct concerning for malignancy that was aspirated and sent for cytology.  The stricture was treated with stent placement.    Microbiology summarized: None  Assessment and plan: Principal Problem:   Biliary obstruction due to malignant neoplasm [facility]) Active Problems:   Gout, unspecified   Obesity   Obstructive sleep apnea   Benign essential hypertension   GERD (gastroesophageal reflux disease)   Cardiac pacemaker in situ   Coronary artery disease   Bronchiectasis without complication (HCC)   Atrial fibrillation (HCC)   Pancreatic mass   Status post bariatric surgery   Elevated LFTs  Obstructive jaundice likely due to biliary stricture Pancreatic mass, liver lesions, elevated liver enzymes, hyperbilirubinemia -Saw PCP for ongoing weight loss and LFT. Hx of colon cancer  and prostate cancer. -CT on 5/31 showed 2 cm pancreatic mass within the proximal body, biliary and pancreatic ductal dilation and multiple liver lesions concerning for malignancy. -6/10-ERCP showed a single localized biliary structure in CBD concerning for malignancy that was aspirated and sent for cytology.  The stricture was treated with stent placement.  Follow cytology -GI res: Recommended Lovenox for anticoagulation in case cytology  unrevealing and liver biopsy needed down the road -Oncology, Dr. Truett Perna to arrange outpatient follow-up -Continue trending LFTs and bilirubin -Right below diet.   Chronic atrial fibrillation/complete heart block s/p PPM: Rate controlled.  INR 1.4. -Anticoagulation resumed with Lovenox per GI -Discontinue warfarin on discharge  Binocular diplopia: Patient developed binocular diplopia after ERCP.  CT head without acute finding.  Diplopia resolved. -Follow MRI brain, MRA head and neck, TTE -Anticoagulation resumed with Lovenox. -Follow lipid panel. -Neurology following  CAD: No cardiopulmonary symptoms. -Continue home valsartan as above   Hypertension: Normotensive. -Continue home amlodipine and losartan -Discontinued HCTZ due to hypokalemia  Bronchiectasis -Not currently on any inhalers   Status post bariatric surgery -Noted   History of colon cancer s/p sigmoid colectomy in 1989 History of prostate cancer s/p radioactive seed implant in 2020   OSA on CPAP: -Encouraged to use CPAP.  Hypokalemia -Monitor replenish as appropriate    Gout -Continue home allopurinol  Pruritus: Likely due to hyperbilirubinemia.  Resolving. -Hydroxy and as needed  Obesity Body mass index is 32.38 kg/m. -Encourage lifestyle change to lose weight.          DVT prophylaxis:  On full dose anticoagulation.  Code Status: Full code Family Communication: Updated patient's daughter's at bedside. Level of care: Telemetry Medical Status is: Inpatient The patient will remain to inpatient because: Obstructive jaundice and ocular diplopia   Final disposition: Home in the next 24 to 48 hours. Consultants:  GI Neurology  55 minutes with more than 50% spent in reviewing records, counseling patient/family and coordinating care.   Sch Meds:  Scheduled Meds:  allopurinol  300 mg Oral Daily   amLODipine  10 mg Oral Daily   calcium carbonate  400 mg of elemental calcium Oral TID    camphor-menthol   Topical Daily   cholestyramine  4 g Oral BID   enoxaparin (LOVENOX) injection  100 mg Subcutaneous Q12H   indomethacin  100 mg Rectal Once   irbesartan  300 mg Oral Daily   sodium chloride flush  3 mL Intravenous Q12H   tamsulosin  0.4 mg Oral Daily   Continuous Infusions:   PRN Meds:.acetaminophen **OR** acetaminophen, alum & mag hydroxide-simeth, camphor-menthol, hydrALAZINE, hydrOXYzine, polyethylene glycol  Antimicrobials: Anti-infectives (From admission, onward)    Start     Dose/Rate Route Frequency Ordered Stop   12/13/22 1300  Ampicillin-Sulbactam (UNASYN) 3 g in sodium chloride 0.9 % 100 mL IVPB  Status:  Discontinued        3 g 200 mL/hr over 30 Minutes Intravenous  Once 12/13/22 1256 12/13/22 1529        I have personally reviewed the following labs and images: CBC: Recent Labs  Lab 12/10/22 1444 12/11/22 0047 12/12/22 0056 12/13/22 0047 12/14/22 0037  WBC 5.8 5.2 5.3 5.8 5.8  HGB 12.1* 11.8* 11.8* 12.3* 11.4*  HCT 37.3* 35.4* 35.6* 37.3* 34.3*  MCV 90.8 89.2 92.2 89.4 91.0  PLT 242 213 201 211 192   BMP &GFR Recent Labs  Lab 12/10/22 1444 12/11/22 0047 12/12/22 0056 12/13/22 0047 12/14/22 0738  NA 139 139 136 137  137  K 3.9 3.5 3.3* 3.8 3.6  CL 106 105 106 105 106  CO2 25 23 21* 22 23  GLUCOSE 146* 126* 125* 122* 122*  BUN 20 20 23 23  25*  CREATININE 1.23 1.20 1.18 1.24 1.25*  CALCIUM 9.2 8.7* 8.6* 9.0 9.1  MG  --   --  2.1 2.2  --   PHOS  --   --  3.8 3.8  --    Estimated Creatinine Clearance: 51.4 mL/min (A) (by C-G formula based on SCr of 1.25 mg/dL (H)). Liver & Pancreas: Recent Labs  Lab 12/10/22 1444 12/11/22 0047 12/12/22 0056 12/13/22 0047 12/14/22 0738  AST 319* 264* 238* 241* 193*  ALT 642* 553* 504* 503* 445*  ALKPHOS 682* 643* 636* 702* 696*  BILITOT 7.1* 7.2* 8.7* 10.0* 8.4*  PROT 6.3* 5.6* 5.5* 6.1* 5.5*  ALBUMIN 2.9* 2.7* 2.5* 2.8* 2.7*   No results for input(s): "LIPASE", "AMYLASE" in the last 168  hours. No results for input(s): "AMMONIA" in the last 168 hours. Diabetic: No results for input(s): "HGBA1C" in the last 72 hours. No results for input(s): "GLUCAP" in the last 168 hours. Cardiac Enzymes: No results for input(s): "CKTOTAL", "CKMB", "CKMBINDEX", "TROPONINI" in the last 168 hours. No results for input(s): "PROBNP" in the last 8760 hours. Coagulation Profile: Recent Labs  Lab 12/12/22 0056 12/13/22 0047 12/13/22 1127 12/13/22 1727 12/14/22 0037  INR 2.2* 2.5* 2.0* 1.6* 1.4*   Thyroid Function Tests: No results for input(s): "TSH", "T4TOTAL", "FREET4", "T3FREE", "THYROIDAB" in the last 72 hours. Lipid Profile: No results for input(s): "CHOL", "HDL", "LDLCALC", "TRIG", "CHOLHDL", "LDLDIRECT" in the last 72 hours. Anemia Panel: No results for input(s): "VITAMINB12", "FOLATE", "FERRITIN", "TIBC", "IRON", "RETICCTPCT" in the last 72 hours. Urine analysis:    Component Value Date/Time   COLORURINE YELLOW 08/02/2020 1614   APPEARANCEUR CLEAR 08/02/2020 1614   LABSPEC 1.019 08/02/2020 1614   PHURINE 5.0 08/02/2020 1614   GLUCOSEU NEGATIVE 08/02/2020 1614   HGBUR NEGATIVE 08/02/2020 1614   BILIRUBINUR NEGATIVE 08/02/2020 1614   BILIRUBINUR n 10/10/2015 0919   KETONESUR NEGATIVE 08/02/2020 1614   PROTEINUR NEGATIVE 08/02/2020 1614   UROBILINOGEN 0.2 10/10/2015 0919   UROBILINOGEN 0.2 05/13/2015 0922   NITRITE NEGATIVE 08/02/2020 1614   LEUKOCYTESUR NEGATIVE 08/02/2020 1614   Sepsis Labs: Invalid input(s): "PROCALCITONIN", "LACTICIDVEN"  Microbiology: Recent Results (from the past 240 hour(s))  Surgical PCR screen     Status: None   Collection Time: 12/11/22  1:12 AM   Specimen: Nasal Mucosa; Nasal Swab  Result Value Ref Range Status   MRSA, PCR NEGATIVE NEGATIVE Final   Staphylococcus aureus NEGATIVE NEGATIVE Final    Comment: (NOTE) The Xpert SA Assay (FDA approved for NASAL specimens in patients 23 years of age and older), is one component of a  comprehensive surveillance program. It is not intended to diagnose infection nor to guide or monitor treatment. Performed at [facility], [address] [place], [place] [address]     Radiology Studies: CT HEAD WO CONTRAST ( )  Result Date: 12/13/2022 CLINICAL DATA:  Vision loss, double vision EXAM: CT HEAD WITHOUT CONTRAST TECHNIQUE: Contiguous axial images were obtained from the base of the skull through the vertex without intravenous contrast. RADIATION DOSE REDUCTION: This exam was performed according to the departmental dose-optimization program which includes automated exposure control, adjustment of the mA and/or kV according to patient size and/or use of iterative reconstruction technique. COMPARISON:  03/15/2022 CT maxillofacial, 05/27/2020 CT head FINDINGS: Brain: No  evidence of acute infarction, hemorrhage, mass, mass effect, or midline shift. No hydrocephalus or extra-axial fluid collection. Vascular: No hyperdense vessel. Skull: Negative for fracture or focal lesion. Sinuses/Orbits: Mucosal thickening and mucous retention cysts in the maxillary sinuses. Mild mucosal thickening in the ethmoid air cells and right frontal sinus. Status post bilateral lens replacements. Other: The mastoid air cells are well aerated. IMPRESSION: No acute intracranial process. Electronically Signed   By: Wiliam Ke M.D.   On: 12/13/2022 17:59      Yuriel Lopezmartinez T. Efosa Treichler [facility]  If 7PM-7AM, please contact night-coverage [email or web address] 12/14/2022, 2:56 PM

## 2022-12-14 NOTE — Progress Notes (Addendum)
STROKE TEAM PROGRESS NOTE   SUBJECTIVE (INTERVAL HISTORY) Marcus [name] daughter is at the bedside.  Overall Marcus [name] condition is completely resolved. He stated that he had diplopia yesterday for about 2 hours, this morning he woke up and no diplopia at all. GI on board and plan for lovenox for short term in case more procedure needed.   OBJECTIVE Temp:  [97.5 F (36.4 C)-98.2 F (36.8 C)] 98.2 F (36.8 C) (06/11 0736) Pulse Rate:  [57-92] 59 (06/11 0736) Cardiac Rhythm: A-V Sequential paced;Bundle branch block (06/11 0815) Resp:  [9-20] 17 (06/11 0736) BP: (97-140)/(45-89) 134/60 (06/11 0736) SpO2:  [96 %-100 %] 100 % (06/11 0736)  No results for input(s): "GLUCAP" in the last 168 hours. Recent Labs  Lab 12/10/22 1444 12/11/22 0047 12/12/22 0056 12/13/22 0047 12/14/22 0738  NA 139 139 136 137 137  K 3.9 3.5 3.3* 3.8 3.6  CL 106 105 106 105 106  CO2 25 23 21* 22 23  GLUCOSE 146* 126* 125* 122* 122*  BUN 20 20 23 23  25*  CREATININE 1.23 1.20 1.18 1.24 1.25*  CALCIUM 9.2 8.7* 8.6* 9.0 9.1  MG  --   --  2.1 2.2  --   PHOS  --   --  3.8 3.8  --    Recent Labs  Lab 12/10/22 1444 12/11/22 0047 12/12/22 0056 12/13/22 0047 12/14/22 0738  AST 319* 264* 238* 241* 193*  ALT 642* 553* 504* 503* 445*  ALKPHOS 682* 643* 636* 702* 696*  BILITOT 7.1* 7.2* 8.7* 10.0* 8.4*  PROT 6.3* 5.6* 5.5* 6.1* 5.5*  ALBUMIN 2.9* 2.7* 2.5* 2.8* 2.7*   Recent Labs  Lab 12/10/22 1444 12/11/22 0047 12/12/22 0056 12/13/22 0047 12/14/22 0037  WBC 5.8 5.2 5.3 5.8 5.8  HGB 12.1* 11.8* 11.8* 12.3* 11.4*  HCT 37.3* 35.4* 35.6* 37.3* 34.3*  MCV 90.8 89.2 92.2 89.4 91.0  PLT 242 213 201 211 192   No results for input(s): "CKTOTAL", "CKMB", "CKMBINDEX", "TROPONINI" in the last 168 hours. Recent Labs    12/12/22 0056 12/13/22 0047 12/13/22 1127 12/13/22 1727 12/14/22 0037  LABPROT 25.1* 27.1* 22.9* 19.3* 17.2*  INR 2.2* 2.5* 2.0* 1.6* 1.4*   No results for input(s): "COLORURINE", "LABSPEC", "PHURINE",  "GLUCOSEU", "HGBUR", "BILIRUBINUR", "KETONESUR", "PROTEINUR", "UROBILINOGEN", "NITRITE", "LEUKOCYTESUR" in the last 72 hours.  Invalid input(s): "APPERANCEUR"     Component Value Date/Time   CHOL 175 11/04/2021 1331   CHOL 126 01/01/2011 1609   TRIG 80.0 11/04/2021 1331   TRIG 90 01/01/2011 1609   HDL 56.90 11/04/2021 1331   HDL 43 01/01/2011 1609   CHOLHDL 3 11/04/2021 1331   VLDL 16.0 11/04/2021 1331   LDLCALC 102 (H) 11/04/2021 1331   LDLCALC 65 01/01/2011 1609   Lab Results  Component Value Date   HGBA1C 6.3 11/24/2022   No results found for: "LABOPIA", "COCAINSCRNUR", "LABBENZ", "AMPHETMU", "THCU", "LABBARB"  No results for input(s): "ETH" in the last 168 hours.  I have personally reviewed the radiological images below and agree with the radiology interpretations.  DG ERCP  Result Date: 12/14/2022 CLINICAL DATA:  ERCP EXAM: ERCP TECHNIQUE: Multiple spot images obtained with the fluoroscopic device and submitted for interpretation post-procedure. FLUOROSCOPY: Refer to separate report COMPARISON:  None Available. FINDINGS: A total of 10 fluoroscopic spot images taken during ERCP are submitted for review. Initial images demonstrate a scope overlying the upper abdomen. Wire catheterization and contrast injection of the common bile duct are performed. There is a somewhat bulbous appearance of the proximal CBD  and dilated appearance of the proximal intrahepatic biliary ducts. A plastic biliary stent is left in place and the scope was withdrawn. IMPRESSION: ERCP images as described. Refer to separate procedure report for full details. These images were submitted for radiologic interpretation only. Please see the procedural report for the amount of contrast and the fluoroscopy time utilized. Electronically Signed   By: Olive Bass M.D.   On: 12/14/2022 08:25   CT HEAD WO CONTRAST ( )  Result Date: 12/13/2022 CLINICAL DATA:  Vision loss, double vision EXAM: CT HEAD WITHOUT CONTRAST  TECHNIQUE: Contiguous axial images were obtained from the base of the skull through the vertex without intravenous contrast. RADIATION DOSE REDUCTION: This exam was performed according to the departmental dose-optimization program which includes automated exposure control, adjustment of the mA and/or kV according to patient size and/or use of iterative reconstruction technique. COMPARISON:  03/15/2022 CT maxillofacial, 05/27/2020 CT head FINDINGS: Brain: No evidence of acute infarction, hemorrhage, mass, mass effect, or midline shift. No hydrocephalus or extra-axial fluid collection. Vascular: No hyperdense vessel. Skull: Negative for fracture or focal lesion. Sinuses/Orbits: Mucosal thickening and mucous retention cysts in the maxillary sinuses. Mild mucosal thickening in the ethmoid air cells and right frontal sinus. Status post bilateral lens replacements. Other: The mastoid air cells are well aerated. IMPRESSION: No acute intracranial process. Electronically Signed   By: Wiliam Ke M.D.   On: 12/13/2022 17:59   CT ABDOMEN PELVIS W WO CONTRAST  Result Date: 12/03/2022 CLINICAL DATA:  Unintended weight loss. Elevated liver enzymes. EXAM: CT ABDOMEN AND PELVIS WITHOUT AND WITH CONTRAST TECHNIQUE: Multidetector CT imaging of the abdomen and pelvis was performed following the standard protocol before and following the bolus administration of intravenous contrast. Technologist notes state no immediate contrast reaction. RADIATION DOSE REDUCTION: This exam was performed according to the departmental dose-optimization program which includes automated exposure control, adjustment of the mA and/or kV according to patient size and/or use of iterative reconstruction technique. CONTRAST:  80mL OMNIPAQUE IOHEXOL 300 MG/ML  SOLN COMPARISON:  CT 03/01/2010 FINDINGS: Lower chest: Pacemaker wires are partially included. Eventration of the left hemidiaphragm. Mild bibasilar atelectasis. No basilar nodule or focal airspace  disease. Hepatobiliary: There is intrahepatic biliary ductal dilatation. The proximal aspect of the common bile duct is also dilated. The gallbladder is also moderately distended. Suspected pancreatic mass which is not well-defined by CT. Multiple hypodense liver lesions are well circumscribed, majority of which are 10 mm or less, however many are new from remote prior CT. The largest lesion is in the right lobe measuring 2.8 cm and measures simple fluid density. Layering stones or sludge in the gallbladder. Pancreas: Suspected pancreatic mass within the proximal body, not well-defined by CT, series 7, image 28. Potential mass measures approximately 2 cm. Relationship to the adjacent superior mesenteric artery and veins is difficult to define on the current exam, however there is likely mass effect on the superior aspect of the superior mesenteric vein. Ductal dilatation distal to this of 9 mm. There are at least 2 cystic lesions within the pancreatic tail, both measuring 15 mm, on series 7, image 29. No definite peripancreatic inflammation. Spleen: Normal in size without focal abnormality. Adrenals/Urinary Tract: No adrenal nodule. No hydronephrosis or perinephric edema. Homogeneous renal enhancement with symmetric excretion on delayed phase imaging. Low-density lesions in the left kidney are too small to accurately characterize but favored to represent small cysts. No specific imaging follow-up is needed. Urinary bladder is physiologically distended without wall thickening. Stomach/Bowel: Prior  bariatric surgery, presumed gastric bypass. The excluded gastric remnant is distended with contrast consistent with gastric gastric fistula. No small bowel obstruction or inflammation. The appendix is normal. Moderate colonic stool burden. Enteric sutures in the sigmoid. Occasional left colonic diverticula but no diverticulitis. No obvious colonic or bowel mass. Vascular/Lymphatic: Aortic atherosclerosis. No aortic  aneurysm. The portal and splenic veins are patent. There may be slight mass effect on the superior aspect of the superior mesenteric vein but no evidence of venous invasion. No definite peripancreatic or abdominopelvic adenopathy. Reproductive: Brachytherapy seeds in the prostate. Other: No ascites. No abdominopelvic collection. Postsurgical change of the anterior abdominal wall with mesh in the upper abdomen. No granuloma in the anterior abdominal wall. Trace fat in both inguinal canals. Musculoskeletal: The bones are under mineralized. Scoliosis and diffuse degenerative change in the spine. No evidence of focal bone lesion or acute osseous findings. IMPRESSION: 1. Suspected pancreatic mass within the proximal body, not well-defined by CT. Potential mass measures approximately 2 cm. Relationship to the adjacent superior mesenteric artery and veins is difficult to define on the current exam, however there is likely mass effect on the superior aspect of the superior mesenteric vein. Recommend pancreatic protocol MRI for further evaluation, if pacemaker is MRI compatible. 2. Biliary and pancreatic ductal dilatation. 3. Multiple hypodense liver lesions, majority of which are 10 mm or less, however many are new from remote prior CT. Lesions are indeterminate, but given the are new, suspicious for metastatic disease in the setting. 4. Prior bariatric surgery, presumed gastric bypass. The excluded gastric remnant is distended with contrast consistent with gastro gastric fistula. When compared with 2018 upper GI, this is chronic. 5. Two cystic lesions within the pancreatic tail measuring 15 mm, nonspecific. 6. Layering stones or sludge in the gallbladder. Aortic Atherosclerosis (ICD10-I70.0). These results will be called to the ordering clinician or representative by the Radiologist Assistant, and communication documented in the PACS or [employer]. Electronically Signed   By: Narda Rutherford M.D.   On: 12/03/2022  19:29   DG Chest 2 View  Result Date: 11/29/2022 CLINICAL DATA:  Unintentional weight loss EXAM: CHEST - 2 VIEW COMPARISON:  04/01/2022; 06/23/2020; chest CT-09/27/2019 FINDINGS: Unchanged cardiac silhouette and mediastinal contours with partial obscuration of the left heart border secondary to known left anterior diaphragmatic hernia, demonstrated on remote chest CT performed 09/2019. There is mild elevation/eventration of the right hemidiaphragm, unchanged. No focal airspace opacities. No pleural effusion or pneumothorax. No evidence of edema. No acute osseous abnormalities. 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 Presumed postoperative deformity of the distal end of the right clavicle. IMPRESSION: Stable exam without superimposed acute cardiopulmonary disease. Electronically Signed   By: Simonne Come M.D.   On: 11/29/2022 15:41   CUP PACEART REMOTE DEVICE CHECK  Result Date: 11/16/2022 Scheduled remote reviewed. Normal device function.  AF/AFL identified 4/2 in office device check, Warfarin per EPIC.  No new stored events Next remote 91 days. LA, CVRS    PHYSICAL EXAM  Temp:  [97.5 F (36.4 C)-98.2 F (36.8 C)] 98.2 F (36.8 C) (06/11 0736) Pulse Rate:  [57-92] 59 (06/11 0736) Resp:  [9-20] 17 (06/11 0736) BP: (97-140)/(45-89) 134/60 (06/11 0736) SpO2:  [96 %-100 %] 100 % (06/11 0736)  General - Well nourished, well developed, in no apparent distress.  Ophthalmologic - fundi not visualized due to noncooperation.  Cardiovascular - Regular rhythm and rate.  Mental Status -  Level of arousal and orientation to time, place, and  person were intact. Language including expression, naming, repetition, comprehension was assessed and found intact. Attention span and concentration were normal. Fund of Knowledge was assessed and was intact.  Cranial Nerves II - XII - II - Visual field intact OU. III, IV, VI - Extraocular movements intact. V - Facial sensation  intact bilaterally. VII - Facial movement intact bilaterally. VIII - Hearing & vestibular intact bilaterally. X - Palate elevates symmetrically. XI - Chin turning & shoulder shrug intact bilaterally. XII - Tongue protrusion intact.  Motor Strength - The patient's strength was normal in all extremities and pronator drift was absent.  Bulk was normal and fasciculations were absent.   Motor Tone - Muscle tone was assessed at the neck and appendages and was normal.  Reflexes - The patient's reflexes were symmetrical in all extremities and he had no pathological reflexes.  Sensory - Light touch, temperature/pinprick were assessed and were symmetrical.    Coordination - The patient had normal movements in the hands and feet with no ataxia or dysmetria.  Tremor was absent.  Gait and Station - limpimg on the left, chronic with shortened limb on the left   ASSESSMENT/PLAN Marcus [name] is a 82 y.o. male with history of afib on coumadin, HTN, pacemaker, prostate cancer, s/p gastric bypass and recent pancreatic mass admitted for ERCP which was done yesterday. Developed diplopia post procedure but has resolved. No tPA given due to symptoms resolved.    TIA:  posterior TIA likely embolic secondary to PAF off coumadin for GI procedure CT no acute finding  MRI no acute infarct MRA head and neck unremarkable except single short segment moderate to severe distal right P3 stenosis. 2D Echo EF 45 to 50% LDL pending HgbA1c 6.3  lovenox for VTE prophylaxis warfarin daily prior to admission, now on  lovenox therapeutic dosing . Once no more GI precudure planned, will consider to switch to DOAC. Patient counseled to be compliant with Marcus [name] antithrombotic medications Ongoing aggressive stroke risk factor management Disposition:  home soon  Hypertension Stable Long term BP goal normotensive  Hyperlipidemia Home meds:  none  LDL pending, goal < 70 Not statin candidate now given significantly  elevated LFTs.  May consider statin if needed when LFT normalized  Other Stroke Risk Factors Advanced age Obesity, Body mass index is 32.38 kg/m.   Other Active Problems Pacemaker prostate cancer s/p gastric bypass recent pancreatic mass s/p ERCP 6/10  Hospital day # 3  Neurology will sign off. Please call with questions. Pt will follow up with stroke clinic NP at [facility] in about 4 weeks. Thanks for the consult.   Marvel Plan, MD PhD Stroke Neurology 12/14/2022 2:43 PM    To contact Stroke Continuity provider, please refer to [email or web address]. After hours, contact General Neurology

## 2022-12-14 NOTE — Progress Notes (Signed)
Initial Nutrition Assessment  DOCUMENTATION CODES:   Non-severe (moderate) malnutrition in context of acute illness/injury  INTERVENTION:  Multivitamin w/ minerals daily Reached out to MD about liberalizing diet to regular per pt request.   NUTRITION DIAGNOSIS:  Moderate Malnutrition related to acute illness as evidenced by mild muscle depletion, mild fat depletion, percent weight loss.  GOAL:  Patient will meet greater than or equal to 90% of their needs  MONITOR:  PO intake, Labs, I & O's, Weight trends  REASON FOR ASSESSMENT:  Malnutrition Screening Tool   ASSESSMENT:  82 y.o. male presented to the ED after work up from PCP due to concern for pancreatic mass and abnormal labs. PMH includes GERD, gout, obesity, prostate cancer, CAD, A. Fib, hiatal hernia, and s/p bariatric surgery. Pt admitted with biliary obstruction, pancreatic mass, and liver lesions.   6/07 - Admitted 6/10 - s/p ERCP  Pt up in chair at time of RD visit, family at bedside. Pt reports that PTA he was eating normally. Shares that over the past few years he has started to eat less, attributes to getting older. Adds that he has also always ate smaller portions since having bariatric surgery. Pt unsure of which type that he had done, it was done in [place] many years ago. Denies any nausea or vomiting, reports having some looser stools at home PTA as well. Takes a multivitamin daily. Does not utilize any oral nutrition supplements. Pt reports that currently he does not have an appetite. Explained the importance of good nutrition to maintain lean muscle mass. Discussed utilizing oral nutrition supplements at home if appetite continues to be poor to meet caloric and protein needs.  Pt reports a UBW of ~240# and has lost weight over the past few months to current weight of 213#. Denies any changes to cause weight loss. Per EMR, pt with an 8.9% weight loss within ~2 months, this is clinically significant for time frame.   Pt  requesting to be back on a regular diet to receive foods that he actually likes and will eat. RD reached out to MD to discuss.   Meal Intake 6/9-6/11: 45-100% x 3 meals   Medications reviewed and include: Calcium Carbonate Labs reviewed: Sodium 137, Potassium 3.6, BUN 25, Creatinine 1.25   NUTRITION - FOCUSED PHYSICAL EXAM:  Flowsheet Row Most Recent Value  Orbital Region Mild depletion  Upper Arm Region Moderate depletion  Thoracic and Lumbar Region No depletion  Buccal Region Mild depletion  Temple Region No depletion  Clavicle Bone Region Mild depletion  Clavicle and Acromion Bone Region Moderate depletion  Scapular Bone Region Mild depletion  Dorsal Hand No depletion  Patellar Region No depletion  Anterior Thigh Region No depletion  Posterior Calf Region No depletion  Edema (RD Assessment) None  Hair Reviewed  Eyes Reviewed  Mouth Reviewed  Skin Reviewed  Nails Reviewed   Diet Order:   Diet Order             Diet regular Room service appropriate? Yes; Fluid consistency: Thin  Diet effective now                  EDUCATION NEEDS: Education needs have been addressed  Skin:  Skin Assessment: Reviewed RN Assessment  Last BM:  Unknown  Height:  Ht Readings from Last 1 Encounters:  12/13/22 5\' 8"  (1.727 m)   Weight:  Wt Readings from Last 1 Encounters:  12/13/22 96.6 kg   Ideal Body Weight:  70 kg  BMI:  Body mass index is 32.38 kg/m.  Estimated Nutritional Needs:  Kcal:  2000-2200 Protein:  100-120 grams Fluid:  >/= 2 L   Kirby Crigler RD, LDN Clinical Dietitian See [facility] for contact information.

## 2022-12-14 NOTE — Progress Notes (Signed)
   12/14/22 2344  BiPAP/CPAP/SIPAP  Reason BIPAP/CPAP not in use Non-compliant

## 2022-12-14 NOTE — Discharge Instructions (Signed)
Information on my medicine - ELIQUIS (apixaban)  This medication education was reviewed with me or my healthcare representative as part of my discharge preparation.     Why was Eliquis prescribed for you? Eliquis was prescribed for you to reduce the risk of a blood clot forming that can cause a stroke if you have a medical condition called atrial fibrillation (a type of irregular heartbeat).  What do You need to know about Eliquis ? Take your Eliquis TWICE DAILY - one tablet in the morning and one tablet in the evening with or without food. If you have difficulty swallowing the tablet whole please discuss with your pharmacist how to take the medication safely.  Take Eliquis exactly as prescribed by your doctor and DO NOT stop taking Eliquis without talking to the doctor who prescribed the medication.  Stopping may increase your risk of developing a stroke.  Refill your prescription before you run out.  After discharge, you should have regular check-up appointments with your healthcare provider that is prescribing your Eliquis.  In the future your dose may need to be changed if your kidney function or weight changes by a significant amount or as you get older.  What do you do if you miss a dose? If you miss a dose, take it as soon as you remember on the same day and resume taking twice daily.  Do not take more than one dose of ELIQUIS at the same time to make up a missed dose.  Important Safety Information A possible side effect of Eliquis is bleeding. You should call your healthcare provider right away if you experience any of the following: Bleeding from an injury or your nose that does not stop. Unusual colored urine (red or dark brown) or unusual colored stools (red or black). Unusual bruising for unknown reasons. A serious fall or if you hit your head (even if there is no bleeding).  Some medicines may interact with Eliquis and might increase your risk of bleeding or clotting  while on Eliquis. To help avoid this, consult your healthcare provider or pharmacist prior to using any new prescription or non-prescription medications, including herbals, vitamins, non-steroidal anti-inflammatory drugs (NSAIDs) and supplements.  This website has more information on Eliquis (apixaban): http://www.eliquis.com/eliquis/home =======================================  Atrial Fibrillation    Atrial fibrillation is a type of heartbeat that is irregular or fast. If you have this condition, your heart beats without any order. This makes it hard for your heart to pump blood in a normal way. Atrial fibrillation may come and go, or it may become a long-lasting problem. If this condition is not treated, it can put you at higher risk for stroke, heart failure, and other heart problems.  What are the causes? This condition may be caused by diseases that damage the heart. They include: High blood pressure. Heart failure. Heart valve disease. Heart surgery. Other causes include: Diabetes. Thyroid disease. Being overweight. Kidney disease. Sometimes the cause is not known.  What increases the risk? You are more likely to develop this condition if: You are older. You smoke. You exercise often and very hard. You have a family history of this condition. You are a man. You use drugs. You drink a lot of alcohol. You have lung conditions, such as emphysema, pneumonia, or COPD. You have sleep apnea.  What are the signs or symptoms? Common symptoms of this condition include: A feeling that your heart is beating very fast. Chest pain or discomfort. Feeling short of breath. Suddenly feeling   light-headed or weak. Getting tired easily during activity. Fainting. Sweating. In some cases, there are no symptoms.  How is this treated? Treatment for this condition depends on underlying conditions and how you feel when you have atrial fibrillation. They include: Medicines to: Prevent  blood clots. Treat heart rate or heart rhythm problems. Using devices, such as a pacemaker, to correct heart rhythm problems. Doing surgery to remove the part of the heart that sends bad signals. Closing an area where clots can form in the heart (left atrial appendage). In some cases, your doctor will treat other underlying conditions.  Follow these instructions at home:  Medicines Take over-the-counter and prescription medicines only as told by your doctor. Do not take any new medicines without first talking to your doctor. If you are taking blood thinners: Talk with your doctor before you take any medicines that have aspirin or NSAIDs, such as ibuprofen, in them. Take your medicine exactly as told by your doctor. Take it at the same time each day. Avoid activities that could hurt or bruise you. Follow instructions about how to prevent falls. Wear a bracelet that says you are taking blood thinners. Or, carry a card that lists what medicines you take. Lifestyle         Do not use any products that have nicotine or tobacco in them. These include cigarettes, e-cigarettes, and chewing tobacco. If you need help quitting, ask your doctor. Eat heart-healthy foods. Talk with your doctor about the right eating plan for you. Exercise regularly as told by your doctor. Do not drink alcohol. Lose weight if you are overweight. Do not use drugs, including cannabis.  General instructions If you have a condition that causes breathing to stop for a short period of time (apnea), treat it as told by your doctor. Keep a healthy weight. Do not use diet pills unless your doctor says they are safe for you. Diet pills may make heart problems worse. Keep all follow-up visits as told by your doctor. This is important.  Contact a doctor if: You notice a change in the speed, rhythm, or strength of your heartbeat. You are taking a blood-thinning medicine and you get more bruising. You get tired more easily  when you move or exercise. You have a sudden change in weight.  Get help right away if:    You have pain in your chest or your belly (abdomen). You have trouble breathing. You have side effects of blood thinners, such as blood in your vomit, poop (stool), or pee (urine), or bleeding that cannot stop. You have any signs of a stroke. "BE FAST" is an easy way to remember the main warning signs: B - Balance. Signs are dizziness, sudden trouble walking, or loss of balance. E - Eyes. Signs are trouble seeing or a change in how you see. F - Face. Signs are sudden weakness or loss of feeling in the face, or the face or eyelid drooping on one side. A - Arms. Signs are weakness or loss of feeling in an arm. This happens suddenly and usually on one side of the body. S - Speech. Signs are sudden trouble speaking, slurred speech, or trouble understanding what people say. T - Time. Time to call emergency services. Write down what time symptoms started. You have other signs of a stroke, such as: A sudden, very bad headache with no known cause. Feeling like you may vomit (nausea). Vomiting. A seizure.  These symptoms may be an emergency. Do not wait to   see if the symptoms will go away. Get medical help right away. Call your local emergency services (911 in the [place] Do not drive yourself to the hospital. Summary Atrial fibrillation is a type of heartbeat that is irregular or fast. You are at higher risk of this condition if you smoke, are older, have diabetes, or are overweight. Follow your doctor's instructions about medicines, diet, exercise, and follow-up visits. Get help right away if you have signs or symptoms of a stroke. Get help right away if you cannot catch your breath, or you have chest pain or discomfort. This information is not intended to replace advice given to you by your health care provider. Make sure you discuss any questions you have with your health care provider. Document  Revised: 12/13/2018 Document Reviewed: 12/13/2018 Elsevier Patient Education  2020 [employer]vier Inc[employer]    

## 2022-12-14 NOTE — Progress Notes (Addendum)
Attending physician's note   I have taken a history, reviewed the chart, and examined the patient. I performed a substantive portion of this encounter, including complete performance of at least one of the key components, in conjunction with the APP. I agree with the APP's note, impression, and recommendations with my edits.   ERCP on 12/13/2022 with malignant appearing CBD stricture, fluid collected and cytology pending.  5 cm x 10 [place] plastic CBD stent placed.  Feeling much better today since ERCP yesterday, reporting pruritus "80% better".  Did have diplopia yesterday with concern for CVA.  CT head without acute intracranial process.  Was evaluated by Neurology with potential plan for MRI today pending confirmation of pacemaker compatibility.  If not, plan is for repeat CT.  Pharmacy was consulted and weight-based Lovenox being started, with plan to continue with Lovenox for the foreseeable future given possibility of liver biopsy in near term.  T. bili downtrending at 8.4.  Liver enzymes all improving.  Otherwise, he is hopeful for discharge home later today.  - Will follow-up on pending cytology results and contact patient by phone - If cytology negative, current plan is for liver biopsy - Needs follow-up with Oncology as outpatient - Otherwise ok for DC home from a GI standpoint when cleared by Neurology and primary Hospitalist.  Please do not hesitate to contact us with additional questions or concerns.  [address] DO, FACG [phone] office          Progress Note   Subjective  Day #4 Chief Complaint: Jaundice, abnormal CTAP concerning for pancreatic malignancy with biliary obstruction  Status post ERCP 12/13/2022 with a single localized biliary stricture found in the CBD, this was malignant appearing.  Aspirated and the fluid was sent for cytology.  Stricture treated with stent placement.  Patient reports doing well this morning, he tells me the itching is  80% better.  He was able to tolerate a regular diet today and is ready to go home.    Objective   Vital signs in last 24 hours: Temp:  [97.5 F (36.4 C)-98.2 F (36.8 C)] 98.2 F (36.8 C) (06/11 0736) Pulse Rate:  [51-92] 59 (06/11 0736) Resp:  [9-22] 17 (06/11 0736) BP: (97-140)/(45-89) 134/60 (06/11 0736) SpO2:  [96 %-100 %] 100 % (06/11 0736) Weight:  [96.6 kg] 96.6 kg (06/10 1254)   General:  male in NAD Heart:  Regular rate and rhythm; no murmurs Lungs: Respirations even and unlabored, lungs CTA bilaterally Abdomen:  Soft, nontender and nondistended. Normal bowel sounds. Psych:  Cooperative. Normal mood and affect.  Intake/Output from previous day: 06/10 0701 - 06/11 0700 In: 1354 [I.V.:900; Blood:254; IV Piggyback:200] Out: -    Lab Results: Recent Labs    12/12/22 0056 12/13/22 0047 12/14/22 0037  WBC 5.3 5.8 5.8  HGB 11.8* 12.3* 11.4*  HCT 35.6* 37.3* 34.3*  PLT 201 211 192   BMET Recent Labs    12/12/22 0056 12/13/22 0047 12/14/22 0738  NA 136 137 137  K 3.3* 3.8 3.6  CL 106 105 106  CO2 21* 22 23  GLUCOSE 125* 122* 122*  BUN 23 23 25*  CREATININE 1.18 1.24 1.25*  CALCIUM 8.6* 9.0 9.1      Latest Ref Rng & Units 12/14/2022    7:38 AM 12/13/2022   12:47 AM 12/12/2022   12:56 AM  Hepatic Function  Total Protein 6.5 - 8.1 g/dL 5.5  6.1  5.5   Albumin 3.5 - 5.0  g/dL 2.7  2.8  2.5   AST 15 - 41 U/L 193  241  238   ALT 0 - 44 U/L 445  503  504   Alk Phosphatase 38 - 126 U/L 696  702  636   Total Bilirubin 0.3 - 1.2 mg/dL 8.4  16.1  8.7      PT/INR Recent Labs    12/13/22 1727 12/14/22 0037  LABPROT 19.3* 17.2*  INR 1.6* 1.4*    Studies/Results: DG ERCP  Result Date: 12/14/2022 CLINICAL DATA:  ERCP EXAM: ERCP TECHNIQUE: Multiple spot images obtained with the fluoroscopic device and submitted for interpretation post-procedure. FLUOROSCOPY: Refer to separate report COMPARISON:  None Available. FINDINGS: A total of 10 fluoroscopic spot images  taken during ERCP are submitted for review. Initial images demonstrate a scope overlying the upper abdomen. Wire catheterization and contrast injection of the common bile duct are performed. There is a somewhat bulbous appearance of the proximal CBD and dilated appearance of the proximal intrahepatic biliary ducts. A plastic biliary stent is left in place and the scope was withdrawn. IMPRESSION: ERCP images as described. Refer to separate procedure report for full details. These images were submitted for radiologic interpretation only. Please see the procedural report for the amount of contrast and the fluoroscopy time utilized. Electronically Signed   By: Olive Bass M.D.   On: 12/14/2022 08:25   CT HEAD WO CONTRAST ( )  Result Date: 12/13/2022 CLINICAL DATA:  Vision loss, double vision EXAM: CT HEAD WITHOUT CONTRAST TECHNIQUE: Contiguous axial images were obtained from the base of the skull through the vertex without intravenous contrast. RADIATION DOSE REDUCTION: This exam was performed according to the departmental dose-optimization program which includes automated exposure control, adjustment of the mA and/or kV according to patient size and/or use of iterative reconstruction technique. COMPARISON:  03/15/2022 CT maxillofacial, 05/27/2020 CT head FINDINGS: Brain: No evidence of acute infarction, hemorrhage, mass, mass effect, or midline shift. No hydrocephalus or extra-axial fluid collection. Vascular: No hyperdense vessel. Skull: Negative for fracture or focal lesion. Sinuses/Orbits: Mucosal thickening and mucous retention cysts in the maxillary sinuses. Mild mucosal thickening in the ethmoid air cells and right frontal sinus. Status post bilateral lens replacements. Other: The mastoid air cells are well aerated. IMPRESSION: No acute intracranial process. Electronically Signed   By: Wiliam Ke M.D.   On: 12/13/2022 17:59       Assessment / Plan:   Assessment: 1.  Abnormal CT showing  pancreatic body mass and multiple hypodense lesions in the liver with biliary stricture: Initially presented with weight loss and jaundice, now status post ERCP 12/13/2022 stent placement and LFTs are trending down, pruritus is 80% better 2.  Remote history of gastric bypass 3.  History of prostate cancer status post seed implants 4.  A-fib: on Coumadin  Plan: 1.  Okay for discharge home from GI standpoint 2.  Recommend getting an oncology appointment 3.  Discussed with patient that Dr. Leone Payor will follow-up with him as soon as cytology is available on next steps.  If/when he needs repeat ERCP would recommend Olympus duodenoscope per Dr. Leone Payor 4.  Continue regular diet  Thank you for your kind consultation, we will sign off.    LOS: 3 days   Unk Lightning  12/14/2022, 9:53 AM

## 2022-12-15 ENCOUNTER — Telehealth: Payer: Self-pay | Admitting: Internal Medicine

## 2022-12-15 ENCOUNTER — Ambulatory Visit: Payer: Medicare Other | Admitting: Gastroenterology

## 2022-12-15 ENCOUNTER — Other Ambulatory Visit (HOSPITAL_COMMUNITY): Payer: Self-pay

## 2022-12-15 DIAGNOSIS — K831 Obstruction of bile duct: Secondary | ICD-10-CM | POA: Diagnosis not present

## 2022-12-15 DIAGNOSIS — C801 Malignant (primary) neoplasm, unspecified: Secondary | ICD-10-CM | POA: Diagnosis not present

## 2022-12-15 DIAGNOSIS — E440 Moderate protein-calorie malnutrition: Secondary | ICD-10-CM | POA: Insufficient documentation

## 2022-12-15 LAB — LIPID PANEL
Cholesterol: 213 mg/dL — ABNORMAL HIGH (ref 0–200)
HDL: 35 mg/dL — ABNORMAL LOW (ref >40)
LDL Cholesterol: 156 mg/dL — ABNORMAL HIGH (ref 0–99)
Total CHOL/HDL Ratio: 6.1 RATIO
Triglycerides: 111 mg/dL (ref <150)
VLDL: 22 mg/dL (ref 0–40)

## 2022-12-15 LAB — COMPREHENSIVE METABOLIC PANEL
ALT: 358 U/L — ABNORMAL HIGH (ref 0–44)
AST: 156 U/L — ABNORMAL HIGH (ref 15–41)
Albumin: 2.7 g/dL — ABNORMAL LOW (ref 3.5–5.0)
Alkaline Phosphatase: 584 U/L — ABNORMAL HIGH (ref 38–126)
Anion gap: 10 (ref 5–15)
BUN: 25 mg/dL — ABNORMAL HIGH (ref 8–23)
CO2: 23 mmol/L (ref 22–32)
Calcium: 8.8 mg/dL — ABNORMAL LOW (ref 8.9–10.3)
Chloride: 104 mmol/L (ref 98–111)
Creatinine, Ser: 1.18 mg/dL (ref 0.61–1.24)
GFR, Estimated: >60 mL/min (ref >60)
Glucose, Bld: 112 mg/dL — ABNORMAL HIGH (ref 70–99)
Potassium: 3.5 mmol/L (ref 3.5–5.1)
Sodium: 137 mmol/L (ref 135–145)
Total Bilirubin: 5.9 mg/dL — ABNORMAL HIGH (ref 0.3–1.2)
Total Protein: 6 g/dL — ABNORMAL LOW (ref 6.5–8.1)

## 2022-12-15 LAB — CBC
HCT: 35.4 % — ABNORMAL LOW (ref 39.0–52.0)
Hemoglobin: 11.8 g/dL — ABNORMAL LOW (ref 13.0–17.0)
MCH: 29.7 pg (ref 26.0–34.0)
MCHC: 33.3 g/dL (ref 30.0–36.0)
MCV: 89.2 fL (ref 80.0–100.0)
Platelets: 218 10*3/uL (ref 150–400)
RBC: 3.97 MIL/uL — ABNORMAL LOW (ref 4.22–5.81)
RDW: 17.5 % — ABNORMAL HIGH (ref 11.5–15.5)
WBC: 5.5 10*3/uL (ref 4.0–10.5)
nRBC: 0 % (ref 0.0–0.2)

## 2022-12-15 LAB — CYTOLOGY - NON PAP

## 2022-12-15 LAB — PHOSPHORUS: Phosphorus: 3.1 mg/dL (ref 2.5–4.6)

## 2022-12-15 LAB — MAGNESIUM: Magnesium: 2.2 mg/dL (ref 1.7–2.4)

## 2022-12-15 MED ORDER — POLYETHYLENE GLYCOL 3350 17 GM/SCOOP PO POWD
17.0000 g | Freq: Every day | ORAL | 0 refills | Status: DC | PRN
  Filled 2022-12-15: qty 238, 14d supply, fill #0

## 2022-12-15 MED ORDER — CAMPHOR-MENTHOL 0.5-0.5 % EX LOTN
TOPICAL_LOTION | CUTANEOUS | 0 refills | Status: DC | PRN
  Filled 2022-12-15: qty 222, fill #0

## 2022-12-15 MED ORDER — APIXABAN 5 MG PO TABS
5.0000 mg | ORAL_TABLET | Freq: Two times a day (BID) | ORAL | 0 refills | Status: DC
  Filled 2022-12-15: qty 60, 30d supply, fill #0

## 2022-12-15 MED ORDER — POTASSIUM CHLORIDE CRYS ER 20 MEQ PO TBCR
40.0000 meq | EXTENDED_RELEASE_TABLET | Freq: Once | ORAL | Status: AC
  Administered 2022-12-15: 40 meq via ORAL
  Filled 2022-12-15: qty 2

## 2022-12-15 NOTE — Telephone Encounter (Signed)
Called results to daughter - I did not realize he is still in hospital  Cytology is negative  Agree w/ liver bx  Also shared - no stroke on MR

## 2022-12-15 NOTE — Plan of Care (Signed)
  Problem: Education: Goal: Knowledge of General Education information will improve Description: Including pain rating scale, medication(s)/side effects and non-pharmacologic comfort measures 12/15/2022 1449 by Letta Moynahan, RN Outcome: Adequate for Discharge 12/15/2022 1133 by Letta Moynahan, RN Outcome: Progressing   Problem: Health Behavior/Discharge Planning: Goal: Ability to manage health-related needs will improve 12/15/2022 1449 by Letta Moynahan, RN Outcome: Adequate for Discharge 12/15/2022 1133 by Letta Moynahan, RN Outcome: Progressing   Problem: Clinical Measurements: Goal: Ability to maintain clinical measurements within normal limits will improve 12/15/2022 1449 by Letta Moynahan, RN Outcome: Adequate for Discharge 12/15/2022 1133 by Letta Moynahan, RN Outcome: Progressing Goal: Will remain free from infection 12/15/2022 1449 by Letta Moynahan, RN Outcome: Adequate for Discharge 12/15/2022 1133 by Letta Moynahan, RN Outcome: Progressing Goal: Diagnostic test results will improve 12/15/2022 1449 by Letta Moynahan, RN Outcome: Adequate for Discharge 12/15/2022 1133 by Letta Moynahan, RN Outcome: Progressing Goal: Respiratory complications will improve 12/15/2022 1449 by Letta Moynahan, RN Outcome: Adequate for Discharge 12/15/2022 1133 by Letta Moynahan, RN Outcome: Progressing Goal: Cardiovascular complication will be avoided 12/15/2022 1449 by Letta Moynahan, RN Outcome: Adequate for Discharge 12/15/2022 1133 by Letta Moynahan, RN Outcome: Progressing   Problem: Activity: Goal: Risk for activity intolerance will decrease 12/15/2022 1449 by Letta Moynahan, RN Outcome: Adequate for Discharge 12/15/2022 1133 by Letta Moynahan, RN Outcome: Progressing   Problem: Nutrition: Goal: Adequate nutrition will be maintained 12/15/2022 1449 by Letta Moynahan, RN Outcome: Adequate for Discharge 12/15/2022 1133 by Letta Moynahan, RN Outcome: Progressing   Problem:  Coping: Goal: Level of anxiety will decrease 12/15/2022 1449 by Letta Moynahan, RN Outcome: Adequate for Discharge 12/15/2022 1133 by Letta Moynahan, RN Outcome: Progressing   Problem: Elimination: Goal: Will not experience complications related to bowel motility 12/15/2022 1449 by Letta Moynahan, RN Outcome: Adequate for Discharge 12/15/2022 1133 by Letta Moynahan, RN Outcome: Progressing Goal: Will not experience complications related to urinary retention 12/15/2022 1449 by Letta Moynahan, RN Outcome: Adequate for Discharge 12/15/2022 1133 by Letta Moynahan, RN Outcome: Progressing   Problem: Pain Managment: Goal: General experience of comfort will improve 12/15/2022 1449 by Letta Moynahan, RN Outcome: Adequate for Discharge 12/15/2022 1133 by Letta Moynahan, RN Outcome: Progressing   Problem: Safety: Goal: Ability to remain free from injury will improve 12/15/2022 1449 by Letta Moynahan, RN Outcome: Adequate for Discharge 12/15/2022 1133 by Letta Moynahan, RN Outcome: Progressing   Problem: Skin Integrity: Goal: Risk for impaired skin integrity will decrease 12/15/2022 1449 by Letta Moynahan, RN Outcome: Adequate for Discharge 12/15/2022 1133 by Letta Moynahan, RN Outcome: Progressing

## 2022-12-15 NOTE — Plan of Care (Signed)

## 2022-12-15 NOTE — TOC Benefit Eligibility Note (Addendum)
Pharmacy Patient Advocate Encounter  Insurance verification completed.    The patient is insured through [facility] Medicare part D  Ran test claim for Farxiga 10 mg and the current 30 day co-pay is $47.00.  Ran test claim for Jardiance 10 mg and the current 30 day co-pay is $47.00.  Ran test claim for Eliquis 5 mg and the current 30 day co-pay is $47.00.  Ran test claim for Xarelto 20 mg and the current 30 day co-pay is $47.00.   This test claim was processed through [facility]- copay amounts may vary at other pharmacies due to pharmacy/plan contracts, or as the patient moves through the different stages of their insurance plan.    Roland Earl, CPHT Pharmacy Patient Advocate Specialist [facility] Patient Advocate Team Direct Number: [phone]  Fax: [phone]

## 2022-12-16 ENCOUNTER — Telehealth: Payer: Self-pay

## 2022-12-16 ENCOUNTER — Ambulatory Visit (HOSPITAL_COMMUNITY)
Admission: RE | Admit: 2022-12-16 | Payer: No Typology Code available for payment source | Source: Home / Self Care | Admitting: Gastroenterology

## 2022-12-16 ENCOUNTER — Encounter (HOSPITAL_COMMUNITY): Payer: Self-pay | Admitting: Internal Medicine

## 2022-12-16 ENCOUNTER — Ambulatory Visit: Payer: Self-pay

## 2022-12-16 ENCOUNTER — Other Ambulatory Visit: Payer: Self-pay

## 2022-12-16 DIAGNOSIS — K8689 Other specified diseases of pancreas: Secondary | ICD-10-CM

## 2022-12-16 SURGERY — UPPER ENDOSCOPIC ULTRASOUND (EUS) RADIAL
Anesthesia: Monitor Anesthesia Care

## 2022-12-16 NOTE — Telephone Encounter (Signed)
-----   Message from [name], MD sent at 12/15/2022  8:41 PM EDT ----- Thank, I will see him 6/17 and after EUS ----- Message ----- From: Lemar Lofty., MD Sent: 12/15/2022   5:14 PM EDT To: Iva Boop, MD; Loretha Stapler, RN; #  Edgardo Petrenko, Please proceed with scheduling this patient for EUS with me next Thursday for attempt at sampling of presumed pancreatic mass. Thanks. GM ----- Message ----- From: Shellia Cleverly, DO Sent: 12/15/2022   4:32 PM EDT To: Iva Boop, MD; [name], MD; #  Hey team,  Just wanted to close the loop on this patient. ERCP done as inpatient by CG with stent in place. Cytology was negative. IR said no liver lesion that was able to target. He was discharged home today, so I wanted to make sure he didn't fall through the cracks with his work-up and plan for EUS as outpatient.  I believe he has an appt with Dr. Truett Perna on 6/17. Sent out on Lovenox.   Thanks.   NIKE

## 2022-12-16 NOTE — Discharge Summary (Signed)
Physician Discharge Summary   Patient: Marcus [name] MRN: [id number] DOB: 1941/04/23  Admit date:     12/10/2022  Discharge date: 12/15/2022  Discharge Physician: Lynden Oxford  PCP: [name], NP  Recommendations at discharge: Follow-up with PCP in 1 week. Follow-up with neurology as recommended. Follow-up and establish care with general cardiology. Follow-up with GI to schedule ERCP.   Follow-up Information     [facility]. Schedule an appointment as soon as possible for a visit in 1 month(s).   Specialty: Neurology Why: stroke clinic Contact information: [address] [place] [address] [phone]        [name], NP. Schedule an appointment as soon as possible for a visit in 1 week(s).   Specialty: Family Medicine Contact information: [address] [place] [address] [phone]         Iva Boop, MD. Call.   Specialty: Gastroenterology Why: As needed Contact information: [address] [place] [address] [phone]                Discharge Diagnoses: Principal Problem:   Biliary obstruction due to malignant neoplasm [facility]) Active Problems:   Gout, unspecified   Obesity   Obstructive sleep apnea   Benign essential hypertension   GERD (gastroesophageal reflux disease)   Cardiac pacemaker in situ   Coronary artery disease   Bronchiectasis without complication (HCC)   Atrial fibrillation (HCC)   Pancreatic mass   Status post bariatric surgery   Elevated LFTs   Malnutrition of moderate degree  Assessment and Plan  Obstructive jaundice likely due to biliary stricture Pancreatic mass, liver lesions, elevated liver enzymes, hyperbilirubinemia Elevated CA 19-9 levels -Saw PCP for ongoing weight loss and LFT. Hx of colon cancer and prostate cancer. -CT on 5/31 showed 2 cm pancreatic mass within the proximal body, biliary and pancreatic ductal dilation  and multiple liver lesions concerning for malignancy. -6/10-ERCP showed a single localized biliary structure in CBD concerning for malignancy that was aspirated and sent for cytology.  The stricture was treated with stent placement.  Cytology was negative for any malignant cells. Patient was initially recommended to consider liver biopsy although per my discussion with IR on 6/12 there is no good target lesion for biopsy in the liver despite multiple mass. Further discussion with the GI was had and recommendation was consideration of ERCP with EUS. This will be arranged outpatient. Patient already has an oncology appointment outpatient. Patient was on warfarin prior to admission for anticoagulation which was held and patient was started on Lovenox for A-fib anticoagulation. On further discussion patient currently agreeable to transition to Eliquis for anticoagulation. This can be held 2 days before. Discussed with cardiology, patient does not have a significant high CHA2DS2-VASc score which would require bridging therapy and patient can come off anticoagulation for procedure.   Chronic atrial fibrillation/complete heart block s/p PPM:  Rate controlled. Patient is on warfarin prior to admission. This was held. Patient is not aware about other options for anticoagulation and currently agreeable to transition to DOAC's. Patient's preference would be Eliquis twice daily. CHA2DS2-VASc score is 3 which is not significantly high enough to require bridging therapy for now. Patient can be safely off of anticoagulation for the duration even post ERCP per my discussion with cardiology.   Binocular diplopia:  Patient developed binocular diplopia after ERCP.  CT head without acute finding.  Diplopia resolved. Neurology was consulted. MRI brain MRA head negative for  any acute abnormality. Symptom completely resolved. Suspect this was anesthesia effect. Outpatient follow-up recommended by neurology but  no further change in therapy recommended.  Other than changing to DOAC's.   CAD:  No cardiopulmonary symptoms. Blood pressure is soft. Continue on the Norvasc.   Hypertension: Normotensive. Systolic dysfunction. Patient is on amlodipine and ARB prior to admission. Patient was also on HCTZ. Currently blood pressure is soft but Will continue amlodipine only. Echocardiogram was performed this admission which showed EF of 45%. Most likely this is secondary to pacemaker Before initiating GDMT I would recommend the patient to follow-up with general cardiology given his soft blood pressure.   Bronchiectasis -Not currently on any inhalers   Status post bariatric surgery -Noted   History of colon cancer s/p sigmoid colectomy in 1989 History of prostate cancer s/p radioactive seed implant in 2020   OSA on CPAP: -Encouraged to use CPAP.   Hypokalemia -Monitor replenish as appropriate    Gout -Continue home allopurinol   Pruritus: Likely due to hyperbilirubinemia.  Resolving. Sarna lotion.   Obesity Body mass index is 32.38 kg/m. -Encourage lifestyle change to lose weight.  Consultants:  GI Phone discussion with cardiology  Procedures performed:  ERCP and stent placement  DISCHARGE MEDICATION: Allergies as of 12/15/2022       Reactions   Contrast Media [iodinated Contrast Media] Palpitations   TACHYCARDIA- patient states he has tolerated newer agents since this reaction >30 yrs ago        Medication List     STOP taking these medications    phytonadione 5 MG tablet Commonly known as: VITAMIN K   valsartan-hydrochlorothiazide 320-25 MG tablet Commonly known as: DIOVAN-HCT   warfarin 5 MG tablet Commonly known as: COUMADIN       TAKE these medications    allopurinol 300 MG tablet Commonly known as: ZYLOPRIM Take 1 tablet (300 mg total) by mouth daily.   amLODipine 10 MG tablet Commonly known as: NORVASC Take 1 tablet (10 mg total) by mouth  daily.   CALCIUM 600 + D PO Take 1 tablet by mouth daily. 800 units of vitamin D   camphor-menthol lotion Commonly known as: SARNA Apply topically as needed for itching.   Eliquis 5 MG Tabs tablet Generic drug: apixaban Take 1 tablet (5 mg total) by mouth 2 (two) times daily.   Flutter Devi 1 each by Does not apply route in the morning and at bedtime.   hydrOXYzine 50 MG tablet Commonly known as: ATARAX Take 1 tablet (50 mg total) by mouth 3 (three) times daily as needed for itching.   multivitamin tablet Take 1 tablet by mouth in the morning.   polyethylene glycol powder 17 GM/SCOOP powder Commonly known as: GLYCOLAX/MIRALAX Take 17g (1 capful) by mouth as directed daily as needed for mild constipation.   sodium chloride HYPERTONIC 3 % nebulizer solution Take by nebulization as needed for other.   tamsulosin 0.4 MG Caps capsule Commonly known as: FLOMAX Take 0.4 mg by mouth daily.       Disposition: Home Diet recommendation: Cardiac diet  Discharge Exam: Vitals:   12/14/22 1711 12/14/22 2000 12/15/22 0430 12/15/22 0718  BP: 138/65 (!) 137/58 (!) 155/66 104/80  Pulse: 60 65 (!) 56 (!) 51  Resp: 17 18 (!) 21 16  Temp: 97.7 F (36.5 C) 98.2 F (36.8 C) 98 F (36.7 C) 98.6 F (37 C)  TempSrc: Oral Oral Oral Oral  SpO2: 98% 98% 97% 99%  Weight:  Height:       General: Appear in mild distress; no visible Abnormal Neck Mass Or lumps, Conjunctiva normal Cardiovascular: S1 and S2 Present, no Murmur, Respiratory: good respiratory effort, Bilateral Air entry present and CTA, no Crackles, no wheezes Abdomen: Bowel Sound present, Non tender  Extremities: no Pedal edema Neurology: alert and oriented to time, place, and person  Filed Weights   12/13/22 1017 12/13/22 1254  Weight: 96.6 kg 96.6 kg   Condition at discharge: stable  The results of significant diagnostics from this hospitalization (including imaging, microbiology, ancillary and laboratory) are  listed below for reference.   Imaging Studies: MR BRAIN WO CONTRAST  Result Date: 12/14/2022 CLINICAL DATA:  Initial evaluation for neuro deficit, stroke suspected. EXAM: MRI HEAD WITHOUT CONTRAST MRA HEAD WITHOUT CONTRAST MRA NECK WITHOUT AND WITH CONTRAST TECHNIQUE: Multiplanar, multi-echo pulse sequences of the brain and surrounding structures were acquired without intravenous contrast. Angiographic images of the Circle of Willis were acquired using MRA technique without intravenous contrast. Angiographic images of the neck were acquired using MRA technique without and with intravenous contrast. Carotid stenosis measurements (when applicable) are obtained utilizing NASCET criteria, using the distal internal carotid diameter as the denominator. CONTRAST:  9.76mL GADAVIST GADOBUTROL 1 MMOL/ML IV SOLN COMPARISON:  CT from 12/13/2022. FINDINGS: MRI HEAD FINDINGS Brain: Mild age-related cerebral atrophy. No significant cerebral white matter disease for age. No evidence for acute or subacute ischemia. Gray-white matter differentiation maintained. No areas of chronic cortical infarction. No acute or chronic intracranial blood products. No mass lesion, midline shift or mass effect. No hydrocephalus or extra-axial fluid collection. Pituitary gland and suprasellar region within normal limits. Vascular: Major intracranial vascular flow voids are maintained. Skull and upper cervical spine: Craniocervical junction within normal limits. Bone marrow signal intensity normal. Degenerative spondylosis noted within the upper cervical spine without high-grade spinal stenosis. No scalp soft tissue abnormality. Sinuses/Orbits: Prior bilateral ocular lens replacement. Few small maxillary sinus retention cyst noted. Paranasal sinuses are otherwise clear. No mastoid effusion. Other: None. MRA HEAD FINDINGS Anterior circulation: Both internal carotid arteries are patent to the termini without stenosis or other abnormality. A1 segments  patent bilaterally. Normal anterior communicating complex. Anterior cerebral arteries widely patent without stenosis. No M1 stenosis or occlusion. No proximal MCA branch occlusion or high-grade stenosis. Distal MCA branches perfused and symmetric. Posterior circulation: Both vertebral arteries widely patent without stenosis. Left vertebral artery dominant. Both PICA patent. Basilar patent without stenosis. Superior cerebral arteries patent bilaterally. Right PCA supplied via the basilar as well as a robust right posterior communicating artery. Fetal type origin left PCA. Both PCAs patent without stenosis. Anatomic variants: As above.  No intracranial aneurysm. MRA NECK FINDINGS Aortic arch: Examination mildly degraded by motion. Visualized aortic arch normal in caliber. Bovine branching pattern noted. No stenosis about the origin the great vessels. Right carotid system: Right common and internal carotid arteries are patent without evidence for dissection. No significant hemodynamically significant stenosis about the right carotid bulb or elsewhere about the right carotid artery system. Left carotid system: Left common and internal carotid arteries are patent without evidence for dissection. No hemodynamically significant stenosis about the left carotid bulb or elsewhere about the left carotid artery system. Vertebral arteries: Both vertebral arteries arise from subclavian arteries. No visible proximal subclavian artery stenosis. Left vertebral artery dominant. No evidence for dissection. Single short-segment moderate to severe distal right P3 stenosis (series 1115, image 10). Vertebral arteries are otherwise patent within the neck with no other visible stenosis.  Other: None IMPRESSION: MRI HEAD IMPRESSION: 1. No acute intracranial abnormality. 2. Mild age-related cerebral atrophy. MRA HEAD IMPRESSION: Negative intracranial MRA for large vessel occlusion or other emergent finding. No hemodynamically significant or  correctable stenosis. MRA NECK IMPRESSION: 1. Negative MRA of the neck for large vessel occlusion. 2. Single short-segment moderate to severe distal right P3 stenosis. 3. Otherwise wide patency of the major arterial vasculature of the neck. No other hemodynamically significant or correctable stenosis. Electronically Signed   By: Rise Mu M.D.   On: 12/14/2022 18:39   MR ANGIO HEAD WO CONTRAST  Result Date: 12/14/2022 CLINICAL DATA:  Initial evaluation for neuro deficit, stroke suspected. EXAM: MRI HEAD WITHOUT CONTRAST MRA HEAD WITHOUT CONTRAST MRA NECK WITHOUT AND WITH CONTRAST TECHNIQUE: Multiplanar, multi-echo pulse sequences of the brain and surrounding structures were acquired without intravenous contrast. Angiographic images of the Circle of Willis were acquired using MRA technique without intravenous contrast. Angiographic images of the neck were acquired using MRA technique without and with intravenous contrast. Carotid stenosis measurements (when applicable) are obtained utilizing NASCET criteria, using the distal internal carotid diameter as the denominator. CONTRAST:  9.8mL GADAVIST GADOBUTROL 1 MMOL/ML IV SOLN COMPARISON:  CT from 12/13/2022. FINDINGS: MRI HEAD FINDINGS Brain: Mild age-related cerebral atrophy. No significant cerebral white matter disease for age. No evidence for acute or subacute ischemia. Gray-white matter differentiation maintained. No areas of chronic cortical infarction. No acute or chronic intracranial blood products. No mass lesion, midline shift or mass effect. No hydrocephalus or extra-axial fluid collection. Pituitary gland and suprasellar region within normal limits. Vascular: Major intracranial vascular flow voids are maintained. Skull and upper cervical spine: Craniocervical junction within normal limits. Bone marrow signal intensity normal. Degenerative spondylosis noted within the upper cervical spine without high-grade spinal stenosis. No scalp soft tissue  abnormality. Sinuses/Orbits: Prior bilateral ocular lens replacement. Few small maxillary sinus retention cyst noted. Paranasal sinuses are otherwise clear. No mastoid effusion. Other: None. MRA HEAD FINDINGS Anterior circulation: Both internal carotid arteries are patent to the termini without stenosis or other abnormality. A1 segments patent bilaterally. Normal anterior communicating complex. Anterior cerebral arteries widely patent without stenosis. No M1 stenosis or occlusion. No proximal MCA branch occlusion or high-grade stenosis. Distal MCA branches perfused and symmetric. Posterior circulation: Both vertebral arteries widely patent without stenosis. Left vertebral artery dominant. Both PICA patent. Basilar patent without stenosis. Superior cerebral arteries patent bilaterally. Right PCA supplied via the basilar as well as a robust right posterior communicating artery. Fetal type origin left PCA. Both PCAs patent without stenosis. Anatomic variants: As above.  No intracranial aneurysm. MRA NECK FINDINGS Aortic arch: Examination mildly degraded by motion. Visualized aortic arch normal in caliber. Bovine branching pattern noted. No stenosis about the origin the great vessels. Right carotid system: Right common and internal carotid arteries are patent without evidence for dissection. No significant hemodynamically significant stenosis about the right carotid bulb or elsewhere about the right carotid artery system. Left carotid system: Left common and internal carotid arteries are patent without evidence for dissection. No hemodynamically significant stenosis about the left carotid bulb or elsewhere about the left carotid artery system. Vertebral arteries: Both vertebral arteries arise from subclavian arteries. No visible proximal subclavian artery stenosis. Left vertebral artery dominant. No evidence for dissection. Single short-segment moderate to severe distal right P3 stenosis (series 1115, image 10).  Vertebral arteries are otherwise patent within the neck with no other visible stenosis. Other: None IMPRESSION: MRI HEAD IMPRESSION: 1. No acute intracranial abnormality.  2. Mild age-related cerebral atrophy. MRA HEAD IMPRESSION: Negative intracranial MRA for large vessel occlusion or other emergent finding. No hemodynamically significant or correctable stenosis. MRA NECK IMPRESSION: 1. Negative MRA of the neck for large vessel occlusion. 2. Single short-segment moderate to severe distal right P3 stenosis. 3. Otherwise wide patency of the major arterial vasculature of the neck. No other hemodynamically significant or correctable stenosis. Electronically Signed   By: Rise Mu M.D.   On: 12/14/2022 18:39   MR ANGIO NECK W WO CONTRAST  Result Date: 12/14/2022 CLINICAL DATA:  Initial evaluation for neuro deficit, stroke suspected. EXAM: MRI HEAD WITHOUT CONTRAST MRA HEAD WITHOUT CONTRAST MRA NECK WITHOUT AND WITH CONTRAST TECHNIQUE: Multiplanar, multi-echo pulse sequences of the brain and surrounding structures were acquired without intravenous contrast. Angiographic images of the Circle of Willis were acquired using MRA technique without intravenous contrast. Angiographic images of the neck were acquired using MRA technique without and with intravenous contrast. Carotid stenosis measurements (when applicable) are obtained utilizing NASCET criteria, using the distal internal carotid diameter as the denominator. CONTRAST:  9.22mL GADAVIST GADOBUTROL 1 MMOL/ML IV SOLN COMPARISON:  CT from 12/13/2022. FINDINGS: MRI HEAD FINDINGS Brain: Mild age-related cerebral atrophy. No significant cerebral white matter disease for age. No evidence for acute or subacute ischemia. Gray-white matter differentiation maintained. No areas of chronic cortical infarction. No acute or chronic intracranial blood products. No mass lesion, midline shift or mass effect. No hydrocephalus or extra-axial fluid collection. Pituitary  gland and suprasellar region within normal limits. Vascular: Major intracranial vascular flow voids are maintained. Skull and upper cervical spine: Craniocervical junction within normal limits. Bone marrow signal intensity normal. Degenerative spondylosis noted within the upper cervical spine without high-grade spinal stenosis. No scalp soft tissue abnormality. Sinuses/Orbits: Prior bilateral ocular lens replacement. Few small maxillary sinus retention cyst noted. Paranasal sinuses are otherwise clear. No mastoid effusion. Other: None. MRA HEAD FINDINGS Anterior circulation: Both internal carotid arteries are patent to the termini without stenosis or other abnormality. A1 segments patent bilaterally. Normal anterior communicating complex. Anterior cerebral arteries widely patent without stenosis. No M1 stenosis or occlusion. No proximal MCA branch occlusion or high-grade stenosis. Distal MCA branches perfused and symmetric. Posterior circulation: Both vertebral arteries widely patent without stenosis. Left vertebral artery dominant. Both PICA patent. Basilar patent without stenosis. Superior cerebral arteries patent bilaterally. Right PCA supplied via the basilar as well as a robust right posterior communicating artery. Fetal type origin left PCA. Both PCAs patent without stenosis. Anatomic variants: As above.  No intracranial aneurysm. MRA NECK FINDINGS Aortic arch: Examination mildly degraded by motion. Visualized aortic arch normal in caliber. Bovine branching pattern noted. No stenosis about the origin the great vessels. Right carotid system: Right common and internal carotid arteries are patent without evidence for dissection. No significant hemodynamically significant stenosis about the right carotid bulb or elsewhere about the right carotid artery system. Left carotid system: Left common and internal carotid arteries are patent without evidence for dissection. No hemodynamically significant stenosis about the  left carotid bulb or elsewhere about the left carotid artery system. Vertebral arteries: Both vertebral arteries arise from subclavian arteries. No visible proximal subclavian artery stenosis. Left vertebral artery dominant. No evidence for dissection. Single short-segment moderate to severe distal right P3 stenosis (series 1115, image 10). Vertebral arteries are otherwise patent within the neck with no other visible stenosis. Other: None IMPRESSION: MRI HEAD IMPRESSION: 1. No acute intracranial abnormality. 2. Mild age-related cerebral atrophy. MRA HEAD IMPRESSION: Negative intracranial MRA  for large vessel occlusion or other emergent finding. No hemodynamically significant or correctable stenosis. MRA NECK IMPRESSION: 1. Negative MRA of the neck for large vessel occlusion. 2. Single short-segment moderate to severe distal right P3 stenosis. 3. Otherwise wide patency of the major arterial vasculature of the neck. No other hemodynamically significant or correctable stenosis. Electronically Signed   By: Rise Mu M.D.   On: 12/14/2022 18:39   ECHOCARDIOGRAM COMPLETE  Result Date: 12/14/2022    ECHOCARDIOGRAM REPORT   Patient Name:   Marcus [name] Date of Exam: 12/14/2022 Medical Rec #:  [id number]       Height:       68.0 in Accession #:    [id number]      Weight:       213.0 lb Date of Birth:  1941/01/14       BSA:          2.099 m Patient Age:    81 years        BP:           134/60 mmHg Patient Gender: M               HR:           93 bpm. Exam Location:  Inpatient Procedure: 2D Echo, Cardiac Doppler, Color Doppler and Intracardiac            Opacification Agent Indications:    Stroke  History:        Patient has prior history of Echocardiogram examinations, most                 recent 07/24/2018. CAD, cancer, Arrythmias:Atrial Fibrillation                 and AV block; Risk Factors:Sleep Apnea, Hypertension and Former                 Smoker.  Sonographer:    Wallie Char Referring Phys: [id number]  Marvel Plan  Sonographer Comments: Technically challenging study due to limited acoustic windows. IMPRESSIONS  1. LVEF mildly reduced with apical motion consistent with RV pacing. Left ventricular ejection fraction, by estimation, is 45 to 50%. The left ventricle has mildly decreased function. The left ventricle has no regional wall motion abnormalities. There is mild concentric left ventricular hypertrophy. Left ventricular diastolic parameters are consistent with Grade I diastolic dysfunction (impaired relaxation).  2. Right ventricular systolic function is normal. The right ventricular size is normal. Tricuspid regurgitation signal is inadequate for assessing PA pressure.  3. The mitral valve is grossly normal. Trivial mitral valve regurgitation. No evidence of mitral stenosis.  4. The aortic valve was not well visualized. Aortic valve regurgitation is not visualized. No aortic stenosis is present.  5. The inferior vena cava is normal in size with greater than 50% respiratory variability, suggesting right atrial pressure of 3 mmHg. Conclusion(s)/Recommendation(s): No intracardiac source of embolism detected on this transthoracic study. Consider a transesophageal echocardiogram to exclude cardiac source of embolism if clinically indicated. FINDINGS  Left Ventricle: LVEF mildly reduced with apical motion consistent with RV pacing. Left ventricular ejection fraction, by estimation, is 45 to 50%. The left ventricle has mildly decreased function. The left ventricle has no regional wall motion abnormalities. Definity contrast agent was given IV to delineate the left ventricular endocardial borders. The left ventricular internal cavity size was normal in size. There is mild concentric left ventricular hypertrophy. Abnormal (paradoxical) septal motion, consistent with RV pacemaker. Left ventricular diastolic parameters  are consistent with Grade I diastolic dysfunction (impaired relaxation). Right Ventricle: The right  ventricular size is normal. No increase in right ventricular wall thickness. Right ventricular systolic function is normal. Tricuspid regurgitation signal is inadequate for assessing PA pressure. Left Atrium: Left atrial size was normal in size. Right Atrium: Right atrial size was normal in size. Pericardium: There is no evidence of pericardial effusion. Mitral Valve: The mitral valve is grossly normal. Trivial mitral valve regurgitation. No evidence of mitral valve stenosis. MV peak gradient, 3.9 mmHg. The mean mitral valve gradient is 1.0 mmHg. Tricuspid Valve: The tricuspid valve is grossly normal. Tricuspid valve regurgitation is not demonstrated. No evidence of tricuspid stenosis. Aortic Valve: The aortic valve was not well visualized. Aortic valve regurgitation is not visualized. No aortic stenosis is present. Aortic valve mean gradient measures 6.5 mmHg. Aortic valve peak gradient measures 12.5 mmHg. Aortic valve area, by VTI measures 3.05 cm. Pulmonic Valve: The pulmonic valve was not well visualized. Aorta: The aortic root and ascending aorta are structurally normal, with no evidence of dilitation. Venous: The inferior vena cava is normal in size with greater than 50% respiratory variability, suggesting right atrial pressure of 3 mmHg. IAS/Shunts: The atrial septum is grossly normal.  LEFT VENTRICLE PLAX 2D LVIDd:         5.20 cm      Diastology LVIDs:         4.10 cm      LV e' medial:    5.50 cm/s LV PW:         1.10 cm      LV E/e' medial:  12.6 LV IVS:        1.20 cm      LV e' lateral:   4.32 cm/s LVOT diam:     2.10 cm      LV E/e' lateral: 16.0 LV SV:         104 LV SV Index:   50 LVOT Area:     3.46 cm  LV Volumes (MOD) LV vol d, MOD A2C: 223.0 ml LV vol d, MOD A4C: 185.0 ml LV vol s, MOD A2C: 126.0 ml LV vol s, MOD A4C: 108.0 ml LV SV MOD A2C:     97.0 ml LV SV MOD A4C:     185.0 ml LV SV MOD BP:      87.1 ml RIGHT VENTRICLE             IVC RV Basal diam:  3.90 cm     IVC diam: 1.60 cm RV S  prime:     18.10 cm/s TAPSE (M-mode): 2.8 cm LEFT ATRIUM             Index        RIGHT ATRIUM           Index LA Vol (A2C):   86.6 ml 41.26 ml/m  RA Area:     22.90 cm LA Vol (A4C):   99.3 ml 47.31 ml/m  RA Volume:   64.90 ml  30.92 ml/m LA Biplane Vol: 92.7 ml 44.16 ml/m  AORTIC VALVE AV Area (Vmax):    2.78 cm AV Area (Vmean):   3.01 cm AV Area (VTI):     3.05 cm AV Vmax:           177.00 cm/s AV Vmean:          121.000 cm/s AV VTI:            0.342 m AV Peak  Grad:      12.5 mmHg AV Mean Grad:      6.5 mmHg LVOT Vmax:         142.00 cm/s LVOT Vmean:        105.000 cm/s LVOT VTI:          0.301 m LVOT/AV VTI ratio: 0.88  AORTA Ao Root diam: 3.60 cm Ao Asc diam:  3.30 cm MITRAL VALVE MV Area (PHT): 2.17 cm     SHUNTS MV Area VTI:   3.38 cm     Systemic VTI:  0.30 m MV Peak grad:  3.9 mmHg     Systemic Diam: 2.10 cm MV Mean grad:  1.0 mmHg MV Vmax:       0.99 m/s MV Vmean:      56.1 cm/s MV Decel Time: 350 msec MV E velocity: 69.10 cm/s MV A velocity: 111.00 cm/s MV E/A ratio:  0.62 Lennie Odor MD Electronically signed by Lennie Odor MD Signature Date/Time: 12/14/2022/3:39:36 PM    Final    DG ERCP  Result Date: 12/14/2022 CLINICAL DATA:  ERCP EXAM: ERCP TECHNIQUE: Multiple spot images obtained with the fluoroscopic device and submitted for interpretation post-procedure. FLUOROSCOPY: Refer to separate report COMPARISON:  None Available. FINDINGS: A total of 10 fluoroscopic spot images taken during ERCP are submitted for review. Initial images demonstrate a scope overlying the upper abdomen. Wire catheterization and contrast injection of the common bile duct are performed. There is a somewhat bulbous appearance of the proximal CBD and dilated appearance of the proximal intrahepatic biliary ducts. A plastic biliary stent is left in place and the scope was withdrawn. IMPRESSION: ERCP images as described. Refer to separate procedure report for full details. These images were submitted for radiologic  interpretation only. Please see the procedural report for the amount of contrast and the fluoroscopy time utilized. Electronically Signed   By: Olive Bass M.D.   On: 12/14/2022 08:25   CT HEAD WO CONTRAST ( )  Result Date: 12/13/2022 CLINICAL DATA:  Vision loss, double vision EXAM: CT HEAD WITHOUT CONTRAST TECHNIQUE: Contiguous axial images were obtained from the base of the skull through the vertex without intravenous contrast. RADIATION DOSE REDUCTION: This exam was performed according to the departmental dose-optimization program which includes automated exposure control, adjustment of the mA and/or kV according to patient size and/or use of iterative reconstruction technique. COMPARISON:  03/15/2022 CT maxillofacial, 05/27/2020 CT head FINDINGS: Brain: No evidence of acute infarction, hemorrhage, mass, mass effect, or midline shift. No hydrocephalus or extra-axial fluid collection. Vascular: No hyperdense vessel. Skull: Negative for fracture or focal lesion. Sinuses/Orbits: Mucosal thickening and mucous retention cysts in the maxillary sinuses. Mild mucosal thickening in the ethmoid air cells and right frontal sinus. Status post bilateral lens replacements. Other: The mastoid air cells are well aerated. IMPRESSION: No acute intracranial process. Electronically Signed   By: Wiliam Ke M.D.   On: 12/13/2022 17:59   CT ABDOMEN PELVIS W WO CONTRAST  Result Date: 12/03/2022 CLINICAL DATA:  Unintended weight loss. Elevated liver enzymes. EXAM: CT ABDOMEN AND PELVIS WITHOUT AND WITH CONTRAST TECHNIQUE: Multidetector CT imaging of the abdomen and pelvis was performed following the standard protocol before and following the bolus administration of intravenous contrast. Technologist notes state no immediate contrast reaction. RADIATION DOSE REDUCTION: This exam was performed according to the departmental dose-optimization program which includes automated exposure control, adjustment of the mA and/or kV  according to patient size and/or use of iterative reconstruction technique. 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 Eventration of the left hemidiaphragm. Mild bibasilar atelectasis. No basilar nodule or focal airspace disease. Hepatobiliary: There is intrahepatic biliary ductal dilatation. The proximal aspect of the common bile duct is also dilated. The gallbladder is also moderately distended. Suspected pancreatic mass which is not well-defined by CT. Multiple hypodense liver lesions are well circumscribed, majority of which are 10 mm or less, however many are new from remote prior CT. The largest lesion is in the right lobe measuring 2.8 cm and measures simple fluid density. Layering stones or sludge in the gallbladder. Pancreas: Suspected pancreatic mass within the proximal body, not well-defined by CT, series 7, image 28. Potential mass measures approximately 2 cm. Relationship to the adjacent superior mesenteric artery and veins is difficult to define on the current exam, however there is likely mass effect on the superior aspect of the superior mesenteric vein. Ductal dilatation distal to this of 9 mm. There are at least 2 cystic lesions within the pancreatic tail, both measuring 15 mm, on series 7, image 29. No definite peripancreatic inflammation. Spleen: Normal in size without focal abnormality. Adrenals/Urinary Tract: No adrenal nodule. No hydronephrosis or perinephric edema. Homogeneous renal enhancement with symmetric excretion on delayed phase imaging. Low-density lesions in the left kidney are too small to accurately characterize but favored to represent small cysts. No specific imaging follow-up is needed. Urinary bladder is physiologically distended without wall thickening. Stomach/Bowel: Prior bariatric surgery, presumed gastric bypass. 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 No small bowel obstruction or inflammation. The appendix is normal. Moderate colonic stool burden. Enteric sutures in the sigmoid. Occasional left colonic diverticula but no diverticulitis. No obvious colonic or bowel mass. Vascular/Lymphatic: Aortic atherosclerosis. No aortic aneurysm. The portal and splenic veins are patent. There may be slight mass effect on the superior aspect of the superior mesenteric vein but no evidence of venous invasion. No definite peripancreatic or abdominopelvic adenopathy. Reproductive: Brachytherapy seeds in the prostate. Other: No ascites. No abdominopelvic collection. Postsurgical change of the anterior abdominal wall with mesh in the upper abdomen. No granuloma in the anterior abdominal wall. Trace fat in both inguinal canals. Musculoskeletal: The bones are under mineralized. Scoliosis and diffuse degenerative change in the spine. No evidence of focal bone lesion or acute osseous findings. IMPRESSION: 1. Suspected pancreatic mass within the proximal body, not well-defined by CT. Potential mass measures approximately 2 cm. Relationship to the adjacent superior mesenteric artery and veins is difficult to define on the current exam, however there is likely mass effect on the superior aspect of the superior mesenteric vein. Recommend pancreatic protocol MRI for further evaluation, if pacemaker is MRI compatible. 2. Biliary and pancreatic ductal dilatation. 3. Multiple hypodense liver lesions, majority of which are 10 mm or less, however many are new from remote prior CT. Lesions are indeterminate, but given the are new, suspicious for metastatic disease in the setting. 4. Prior bariatric surgery, presumed gastric bypass. The excluded gastric remnant is distended with contrast consistent with gastro gastric fistula. When compared with 2018 upper GI, this is chronic. 5. Two cystic lesions within the pancreatic tail measuring 15 mm, nonspecific. 6.  Layering stones or sludge in the gallbladder. Aortic Atherosclerosis (ICD10-I70.0). These results will be called to the ordering clinician or representative by the Radiologist Assistant, and communication documented in the PACS or [employer]. Electronically Signed   By: Shawna Orleans  Sanford M.D.   On: 12/03/2022 19:29   DG Chest 2 View  Result Date: 11/29/2022 CLINICAL DATA:  Unintentional weight loss EXAM: CHEST - 2 VIEW COMPARISON:  04/01/2022; 06/23/2020; chest CT-09/27/2019 FINDINGS: Unchanged cardiac silhouette and mediastinal contours with partial obscuration of the left heart border secondary to known left anterior diaphragmatic hernia, demonstrated on remote chest CT performed 09/2019. There is mild elevation/eventration of the right hemidiaphragm, unchanged. No focal airspace opacities. No pleural effusion or pneumothorax. No evidence of edema. No acute osseous abnormalities. 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 Presumed postoperative deformity of the distal end of the right clavicle. IMPRESSION: Stable exam without superimposed acute cardiopulmonary disease. Electronically Signed   By: Simonne Come M.D.   On: 11/29/2022 15:41    Microbiology: Results for orders placed or performed during the hospital encounter of 12/10/22  Surgical PCR screen     Status: None   Collection Time: 12/11/22  1:12 AM   Specimen: Nasal Mucosa; Nasal Swab  Result Value Ref Range Status   MRSA, PCR NEGATIVE NEGATIVE Final   Staphylococcus aureus NEGATIVE NEGATIVE Final    Comment: (NOTE) The Xpert SA Assay (FDA approved for NASAL specimens in patients 44 years of age and older), is one component of a comprehensive surveillance program. It is not intended to diagnose infection nor to guide or monitor treatment. Performed at [facility], [address], [place], [place] [address]    Labs: CBC: Recent Labs  Lab 12/11/22 0047 12/12/22 0056 12/13/22 0047 12/14/22 0037  12/15/22 0017  WBC 5.2 5.3 5.8 5.8 5.5  HGB 11.8* 11.8* 12.3* 11.4* 11.8*  HCT 35.4* 35.6* 37.3* 34.3* 35.4*  MCV 89.2 92.2 89.4 91.0 89.2  PLT 213 201 211 192 218   Basic Metabolic Panel: Recent Labs  Lab 12/11/22 0047 12/12/22 0056 12/13/22 0047 12/14/22 0738 12/15/22 0017  NA 139 136 137 137 137  K 3.5 3.3* 3.8 3.6 3.5  CL 105 106 105 106 104  CO2 23 21* 22 23 23   GLUCOSE 126* 125* 122* 122* 112*  BUN 20 23 23  25* 25*  CREATININE 1.20 1.18 1.24 1.25* 1.18  CALCIUM 8.7* 8.6* 9.0 9.1 8.8*  MG  --  2.1 2.2  --  2.2  PHOS  --  3.8 3.8  --  3.1   Liver Function Tests: Recent Labs  Lab 12/11/22 0047 12/12/22 0056 12/13/22 0047 12/14/22 0738 12/15/22 0017  AST 264* 238* 241* 193* 156*  ALT 553* 504* 503* 445* 358*  ALKPHOS 643* 636* 702* 696* 584*  BILITOT 7.2* 8.7* 10.0* 8.4* 5.9*  PROT 5.6* 5.5* 6.1* 5.5* 6.0*  ALBUMIN 2.7* 2.5* 2.8* 2.7* 2.7*   CBG: No results for input(s): "GLUCAP" in the last 168 hours.  Discharge time spent: greater than 30 minutes.  Signed: Lynden Oxford, MD [facility] 12/15/2022

## 2022-12-16 NOTE — Chronic Care Management (AMB) (Signed)
   12/16/2022  MUNG RINKER 17-Jan-1941 [id number]   Reason for Encounter Patient no longer enrolled in the CCM program. CCM enrollment status updated.   [facility]/Chronic Care Management [phone]

## 2022-12-16 NOTE — Transitions of Care (Post Inpatient/ED Visit) (Signed)
12/16/2022  Name: Marcus [name] MRN: [id number] DOB: 1941/04/23  Today's TOC FU Call Status: Today's TOC FU Call Status:: Successful TOC FU Call Competed TOC FU Call Complete Date: 12/16/22  Transition Care Management Follow-up Telephone Call Date of Discharge: 12/15/22 Discharge Facility: [name]) Type of Discharge: Inpatient Admission Primary Inpatient Discharge Diagnosis:: "elevated INR" How have you been since you were released from the hospital?: Same (Pt stattes he did not rest well last night-continues to have ongoing 'terrible itching & scratching to back and chest during the nights." He applied medicated lotion ordered with minimal relief-unsure if he took Atarax or not but will take it today.) Any questions or concerns?: Yes Patient Questions/Concerns:: Pt wanting to discuss other tx options for mgmt of severe itching. Patient Questions/Concerns Addressed: Notified Provider of Patient Questions/Concerns (RN CM offered to contact provider or see if sooner appt available but pt declined-states he will keep f/u appt for 12/17/22 and discuss with provider then)  Items Reviewed: Did you receive and understand the discharge instructions provided?: Yes Medications obtained,verified, and reconciled?: Yes (Medications Reviewed) Any new allergies since your discharge?: No Dietary orders reviewed?: Yes Type of Diet Ordered:: low slat/heart healthy Do you have support at home?: Yes  Medications Reviewed Today: Medications Reviewed Today     Reviewed by Charlyn Minerva, RN (Registered Nurse) on 12/16/22 at 1127  Med List Status: <None>   Medication Order Taking? Sig Documenting Provider Last Dose Status Informant  allopurinol (ZYLOPRIM) 300 MG tablet [id number] Yes Take 1 tablet (300 mg total) by mouth daily. Nafziger, Kandee Keen, NP Taking Active Self  amLODipine (NORVASC) 10 MG tablet [id number] Yes Take 1 tablet (10 mg total) by mouth daily. Nafziger, Kandee Keen, NP Taking  Active Self  apixaban (ELIQUIS) 5 MG TABS tablet [id number] Yes Take 1 tablet (5 mg total) by mouth 2 (two) times daily. Rolly Salter, MD Taking Active   Calcium Carb-Cholecalciferol (CALCIUM 600 + D PO) [id number] Yes Take 1 tablet by mouth daily. 800 units of vitamin D [provider] Taking Active Self  camphor-menthol Wynelle Fanny) lotion [id number] Yes Apply topically as needed for itching. Rolly Salter, MD Taking Active   hydrOXYzine (ATARAX) 50 MG tablet [id number] Yes Take 1 tablet (50 mg total) by mouth 3 (three) times daily as needed for itching. Ardith Dark, MD Taking Active Self  Multiple Vitamin (MULTIVITAMIN) tablet [id number] Yes Take 1 tablet by mouth in the morning. [provider] Taking Active Self  polyethylene glycol powder (GLYCOLAX/MIRALAX) 17 GM/SCOOP powder [id number] Yes Take 17g (1 capful) by mouth as directed daily as needed for mild constipation. Rolly Salter, MD Taking Active   Respiratory Therapy Supplies (FLUTTER) DEVI [id number] Yes 1 each by Does not apply route in the morning and at bedtime. [name], DO Taking Active Self  sodium chloride HYPERTONIC 3 % nebulizer solution [id number] Yes Take by nebulization as needed for other. [name], NP Taking Active Self  tamsulosin (FLOMAX) 0.4 MG CAPS capsule [id number] Yes Take 0.4 mg by mouth daily. [provider] Taking Active Self           Med Note (DAVIS, SOPHIA A   Mon Aug 09, 2016 10:27 AM)              Home Care and Equipment/Supplies: Were Home Health Services Ordered?: NA Any new equipment or medical supplies ordered?: NA  Functional Questionnaire: Do you need assistance with bathing/showering or dressing?: No Do you need assistance  with meal preparation?: No Do you need assistance with eating?: No Do you have difficulty maintaining continence: No Do you need assistance with getting out of bed/getting out of a chair/moving?: No Do you have difficulty managing  or taking your medications?: No  Follow up appointments reviewed: PCP Follow-up appointment confirmed?: Yes Date of PCP follow-up appointment?: 12/17/22 Follow-up Provider: [facility] Follow-up appointment confirmed?: Yes Date of Specialist follow-up appointment?: 12/20/22 Follow-Up Specialty Provider:: Dr. Sherrill(oncologist),  [facility]-02/03/23, GI-Dr. Leland Her will make an appt Do you need transportation to your follow-up appointment?: No Do you understand care options if your condition(s) worsen?: Yes-patient verbalized understanding  SDOH Interventions Today    Flowsheet Row Most Recent Value  SDOH Interventions   Food Insecurity Interventions Intervention Not Indicated  Transportation Interventions Intervention Not Indicated       TOC Interventions Today    Flowsheet Row Most Recent Value  TOC Interventions   TOC Interventions Discussed/Reviewed TOC Interventions Discussed, Contacted provider for patient needs      Interventions Today    Flowsheet Row Most Recent Value  General Interventions   General Interventions Discussed/Reviewed General Interventions Discussed, Doctor Visits  Doctor Visits Discussed/Reviewed Doctor Visits Discussed, Specialist, PCP  PCP/Specialist Visits Compliance with follow-up visit  Education Interventions   Education Provided Provided Education  Provided Verbal Education On Nutrition, Medication, When to see the doctor, Other  [sx mgmt]  Nutrition Interventions   Nutrition Discussed/Reviewed Nutrition Discussed  Pharmacy Interventions   Pharmacy Dicussed/Reviewed Pharmacy Topics Discussed, Medications and their functions        Alessandra Grout [facility] Care Management Care Management Community Coordinator Direct Phone: [phone] Toll Free: [phone] Fax: [phone]

## 2022-12-16 NOTE — Telephone Encounter (Signed)
EUS has been scheduled for 12/23/22 at 1245 pm at [facility] with GM  

## 2022-12-16 NOTE — Telephone Encounter (Signed)
since his cytology is negative and he is still here, should we proceed with liver biopsy inpatient now?   Wed 1:17 PM VC Vito Cirigliano V, DO I suppose that is a very reasonable options. Can touch base with IR and see if it can be done this week. They may not have space/Anesthesia support, but worth reaching out. If they can't get it done, then probably ok to go home and arrange as outpatient with them   1  Wed 1:21 PM JL Unk Lightning, PA left. Wed 1:27 PM PP Rolly Salter, MD d/w Dr Milford Cage, he felt that there is no good target mass for him to biopsy, once in right lobe looks chronic. he was thinking if EUS will be better to give [place] a tissue.   Wed 1:28 PM Lemar Lofty., MD and Iva Boop, MD were added by Shellia Cleverly, DO. Wed 1:47 PM VC Vito Cirigliano V, DO Let me loop in Gabe Mansouraty to weigh in, although I suspect will need to be done as outpatient.   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 Cytology was negative. IR not able to access the hepatic lesions. CT with suspected panc mass. Reaching out to see about potential EUS timing/availability on this patient. Patient otherwise stable for d/c and will keep on Lovenox for [facility] so that it can be stopped for any procedures.   Wed 1:48 PM CG Iva Boop, MD I actually called daughter as I thought he was dc - I did tell her liver bx no go and that we will be setting up an outpatient EUS - [facility] [place] is doing an EUS now and will weigh in later though   Wed 1:53 PM VC Vito Cirigliano V, DO Sorry, didn't realize you had already chatted with family and pinged Gabe. Thanks!  Wed 1:54 PM PP Rolly Salter, MD great. thank you so much all. I have discussed with pt and son at bedside and informed them about all these. if the procedure is scheduled for next week, will it be ok to use the apixaban for his anticoagulation and stop it 48 hours  before the procedure?  Wed 2:06 PM CG Iva Boop, MD I think apixaban is ok - not sure when gabe will be able to do - patient sees Brad sherrill 6/17 - but send him home for now and we will sort out  1  Wed 2:19 PM You were added by Lemar Lofty., MD. Wed 5:16 PM GM Lemar Lofty., MD Team, My nurse is going to work on scheduling this patient for an EUS slot next week. I just need to know that we have the approval to hold his apixaban or Lovenox before his procedure and the timing. Please let Yesennia Hirota know what the timing will be. If apixaban then 2 days prior. If Lovenox then 24 hours prior. Thanks. GM  Wed 5:16 PM PP Rolly Salter, MD d/w Cardiology, they can hold apixaban without any concerns. CHadvasc score is not high to require bridging therapy.   Wed 5:21 PM GM Lemar Lofty., MD Thanks. Harkirat Orozco please retain this information as you call him so that we can hold the apixaban for 48 hours prior to procedure. Thanks. GM

## 2022-12-16 NOTE — Telephone Encounter (Signed)
EUS scheduled, pt instructed and medications reviewed.  Patient instructions mailed to home.  Patient to call with any questions or concerns.  

## 2022-12-16 NOTE — Telephone Encounter (Signed)
Left message on machine to call back  

## 2022-12-17 ENCOUNTER — Encounter: Payer: Self-pay | Admitting: Adult Health

## 2022-12-17 ENCOUNTER — Ambulatory Visit (INDEPENDENT_AMBULATORY_CARE_PROVIDER_SITE_OTHER): Payer: Medicare Other | Admitting: Adult Health

## 2022-12-17 VITALS — BP 110/70 | HR 60 | Temp 98.2°F | Ht 68.0 in | Wt 218.0 lb

## 2022-12-17 DIAGNOSIS — Z0001 Encounter for general adult medical examination with abnormal findings: Secondary | ICD-10-CM | POA: Diagnosis not present

## 2022-12-17 DIAGNOSIS — K831 Obstruction of bile duct: Secondary | ICD-10-CM | POA: Diagnosis not present

## 2022-12-17 DIAGNOSIS — E669 Obesity, unspecified: Secondary | ICD-10-CM

## 2022-12-17 DIAGNOSIS — G4733 Obstructive sleep apnea (adult) (pediatric): Secondary | ICD-10-CM

## 2022-12-17 DIAGNOSIS — L299 Pruritus, unspecified: Secondary | ICD-10-CM | POA: Diagnosis not present

## 2022-12-17 DIAGNOSIS — I2510 Atherosclerotic heart disease of native coronary artery without angina pectoris: Secondary | ICD-10-CM

## 2022-12-17 DIAGNOSIS — I10 Essential (primary) hypertension: Secondary | ICD-10-CM

## 2022-12-17 DIAGNOSIS — C801 Malignant (primary) neoplasm, unspecified: Secondary | ICD-10-CM | POA: Diagnosis not present

## 2022-12-17 DIAGNOSIS — H532 Diplopia: Secondary | ICD-10-CM | POA: Diagnosis not present

## 2022-12-17 DIAGNOSIS — M1A9XX0 Chronic gout, unspecified, without tophus (tophi): Secondary | ICD-10-CM | POA: Diagnosis not present

## 2022-12-17 DIAGNOSIS — E876 Hypokalemia: Secondary | ICD-10-CM

## 2022-12-17 DIAGNOSIS — I4891 Unspecified atrial fibrillation: Secondary | ICD-10-CM | POA: Diagnosis not present

## 2022-12-17 LAB — COMPREHENSIVE METABOLIC PANEL
ALT: 320 U/L — ABNORMAL HIGH (ref 0–53)
AST: 181 U/L — ABNORMAL HIGH (ref 0–37)
Albumin: 3.4 g/dL — ABNORMAL LOW (ref 3.5–5.2)
Alkaline Phosphatase: 539 U/L — ABNORMAL HIGH (ref 39–117)
BUN: 30 mg/dL — ABNORMAL HIGH (ref 6–23)
CO2: 26 mEq/L (ref 19–32)
Calcium: 9 mg/dL (ref 8.4–10.5)
Chloride: 106 mEq/L (ref 96–112)
Creatinine, Ser: 1.11 mg/dL (ref 0.40–1.50)
GFR: 61.63 mL/min (ref >60.00)
Glucose, Bld: 106 mg/dL — ABNORMAL HIGH (ref 70–99)
Potassium: 4.1 mEq/L (ref 3.5–5.1)
Sodium: 140 mEq/L (ref 135–145)
Total Bilirubin: 4.1 mg/dL — ABNORMAL HIGH (ref 0.2–1.2)
Total Protein: 6.3 g/dL (ref 6.0–8.3)

## 2022-12-17 NOTE — Progress Notes (Signed)
Attempted to obtain medical history via telephone, unable to reach at this time. HIPAA compliant voicemail message left requesting return call to pre surgical testing department. 

## 2022-12-17 NOTE — Progress Notes (Signed)
Subjective:    Patient ID: Marcus [name], male    DOB: Jan 30, 1941, 82 y.o.   MRN: [id number]  HPI 82 year old male who  has a past medical history of Acute meniscal tear of knee (left), Arthritis, AV block (08/01/2018), Benign essential hypertension (01/18/2007), Bronchiectasis with acute exacerbation (06/23/2020), Chest pain, atypical (08/23/2014), Chronic cough (10/07/2014), Coronary artery disease, Degeneration of lumbar intervertebral disc (10/14/2017), Diverticulosis of colon (07/07/2005), Elevated PSA (06/19/2014), GERD (gastroesophageal reflux disease) (01/18/2007), Gout, unspecified (01/18/2007), Hemorrhoids (01/19/2011), Hiatal hernia, History of colonic polyps (01/18/2007), Insomnia, unspecified (08/21/2007), Iron deficiency anemia, unspecified  (01/19/2011), Lumbar post-laminectomy syndrome (10/14/2017), Lumbar spondylolysis, Lumbar strain (12/14/2011), Obstructive sleep apnea (01/18/2007), Paraesophageal hernia, Primary osteoarthritis of knee (05/23/2015), Prostate cancer (HCC) (2016), S/P knee replacement (05/23/2015), Sensorineural hearing loss, bilateral, Vitamin B12 deficiency, and Vitamin D deficiency (10/28/2009).  He presents to the office today for TCM visit  Admit Date 12/10/2022 Discharge Date 12/15/2022  He was admitted due to increasing INR due to patient being on warfarin for A-fib.  Day prior to admission his INR was greater than 10 when checked and he was prescribed vitamin K and warfarin was held.  He was sent to the emergency room for further evaluation as his ERCP would need to be done inpatient either way for pancreatic mass and liver lesions with biliary obstruction.  Hospital Course   Obstructive jaundice due to biliary stricture. Pancreatic mass, liver lesions, elevated liver enzymes, hyperbilirubinemia  -CT scan on 12/03/2022 showed a 2 cm pancreatic mass within the proximal body, biliary and pancreatic ductal dilation and multiple liver lesions concerning for  malignancy.  He underwent ERCP on 12/13/2022 which showed a single localized biliary stricture in the CBD concerning for malignancy that was aspirated and sent for cytology.  The stricture was treated with stent placement.  Cytology was negative for any malignant cells.  Patient was initially recommended to consider liver biopsy although discussion with IR on 12/15/2022 there is no good target lesion for a biopsy in the liver despite multiple masses.  Further discussion with GI was had and mentation was consideration for ERCP with EUS.  This will be arranged outpatient.  Patient was on warfarin prior to admission for anticoagulation which was held and patient was started on Lovenox for A-fib anticoagulation.  He was then transition to Eliquis for anticoagulation.  Chronic Atrial fibrillation/compete heart block s/p PPM -Transitioned to Eliquis BID   Binocular diplopia  -He developed binocular diplopia after ERCP.  CT head without acute findings.  Diplopia resolved.  Neurology was consulted and he ended up having an MRI of the brain and MRA head which was negative for any acute abnormality.  It was suspected this was a anesthesia event.  It was recommended outpatient follow-up in neurology but no further recommendations and change in therapy.  CAD -Cardiopulmonary symptoms.  His blood pressure was soft during hospital admission.  He was continued on Norvasc  Hypertension  - Patient on amlodipine and ARB prior to admission.  He was also on HCTZ.  ARB and HCTZ were discontinued.  -Go cardiogram was performed this admission which showed an EF of 45%.  Most likely this is secondary to pacemaker. -Was advised to follow-up with his cardiologist  OSA on CPAP -Urged to use CPAP  Hypokalemia  - continue to monitor and replenish as needed  Gout  - continue home allopurinol   Pruritus  - likely due to hyperbilirubinemia - resolving  - Prescribed Sarna lotion   Obesity  -  encouraged lifestyle  modifications    Today he reports that he is doing well after recent hospital admission.  He continues to have significant pruritus, started taking Atarax last night but only worked for an hour.  He has come to terms with likely having cancer.  He is meeting with Dr. Truett Perna on 12/20/2022 will have EUS on 12/23/2022.  Review of Systems  Constitutional: Negative.   HENT: Negative.    Eyes: Negative.   Respiratory: Negative.    Cardiovascular: Negative.   Gastrointestinal: Negative.   Endocrine: Negative.   Genitourinary: Negative.   Musculoskeletal:  Positive for gait problem.  Skin:  Positive for color change.       Pruritus   Allergic/Immunologic: Negative.   Hematological: Negative.   Psychiatric/Behavioral: Negative.    All other systems reviewed and are negative.  Past Medical History:  Diagnosis Date   Acute meniscal tear of knee left   Arthritis    generalized   AV block 08/01/2018   Benign essential hypertension 01/18/2007   Bronchiectasis with acute exacerbation 06/23/2020   Chest pain, atypical 08/23/2014   Chronic cough 10/07/2014   Coronary artery disease    Degeneration of lumbar intervertebral disc 10/14/2017   Diverticulosis of colon 07/07/2005   Elevated PSA 06/19/2014   GERD (gastroesophageal reflux disease) 01/18/2007   Gout, unspecified 01/18/2007   Hemorrhoids 01/19/2011   Hiatal hernia    History of colonic polyps 01/18/2007   Insomnia, unspecified 08/21/2007   Iron deficiency anemia, unspecified  01/19/2011   Lumbar post-laminectomy syndrome 10/14/2017   Lumbar spondylolysis    Lumbar strain 12/14/2011   Obstructive sleep apnea 01/18/2007   uses CPAP nightly   Paraesophageal hernia    large   Primary osteoarthritis of knee 05/23/2015   Prostate cancer (HCC) 2016   S/P knee replacement 05/23/2015   Sensorineural hearing loss, bilateral    Vitamin B12 deficiency    Vitamin D deficiency 10/28/2009    Social History   Socioeconomic History    Marital status: Widowed    Spouse name: Not on file   Number of children: 4   Years of education: 43   Highest education level: Some college, no degree  Occupational History   Occupation: retired    Associate Professor: [employer]  Tobacco Use   Smoking status: Former    Packs/day: 2.00    Years: 20.00    Additional pack years: 0.00    Total pack years: 40.00    Types: Cigarettes    Quit date: 07/05/1974    Years since quitting: 48.4   Smokeless tobacco: Never  Vaping Use   Vaping Use: Never used  Substance and Sexual Activity   Alcohol use: Not Currently    Alcohol/week: 30.0 standard drinks of alcohol    Types: 30 Standard drinks or equivalent per week    Comment: 4-5 glasses of wine or 2-3 beers nightly   Drug use: No   Sexual activity: Not Currently  Other Topics Concern   Not on file  Social History Narrative   Recently widowed   Former Smoker    Alcohol use-yes 1-2 drinks per day      Occupation: Retired Associate Professor      Originally from [place], [place] - in [place] > 10 yrs as of 2017      Social Determinants of Health   Financial Resource Strain: Low Risk  (07/28/2021)   Overall Financial Resource Strain (CARDIA)    Difficulty of Paying Living Expenses: Not hard  at all  Food Insecurity: No Food Insecurity (12/16/2022)   Hunger Vital Sign    Worried About Running Out of Food in the Last Year: Never true    Ran Out of Food in the Last Year: Never true  Transportation Needs: No Transportation Needs (12/16/2022)   PRAPARE - Administrator, Civil Service (Medical): No    Lack of Transportation (Non-Medical): No  Physical Activity: Inactive (07/28/2021)   Exercise Vital Sign    Days of Exercise per Week: 0 days    Minutes of Exercise per Session: 0 min  Stress: No Stress Concern Present (07/28/2021)   [employer] of Occupational Health - Occupational Stress Questionnaire    Feeling of Stress : Not at all  Social Connections: Moderately Isolated (08/04/2020)    Social Connection and Isolation Panel [NHANES]    Frequency of Communication with Friends and Family: Three times a week    Frequency of Social Gatherings with Friends and Family: More than three times a week    Attends Religious Services: More than 4 times per year    Active Member of Clubs or Organizations: No    Attends Banker Meetings: Never    Marital Status: Widowed  Intimate Partner Violence: Not At Risk (12/10/2022)   Humiliation, Afraid, Rape, and Kick questionnaire    Fear of Current or Ex-Partner: No    Emotionally Abused: No    Physically Abused: No    Sexually Abused: No    Past Surgical History:  Procedure Laterality Date   BILIARY BRUSHING  12/13/2022   Procedure: BILIARY BRUSHING;  Surgeon: Iva Boop, MD;  Location: [facility] ENDOSCOPY;  Service: Gastroenterology;;   BILIARY STENT PLACEMENT  12/13/2022   Procedure: BILIARY STENT PLACEMENT;  Surgeon: Iva Boop, MD;  Location[facility] ENDOSCOPY;  Service: Gastroenterology;;   BRONCHIAL WASHINGS  10/30/2019   Procedure: BRONCHIAL WASHINGS;  Surgeon: [name], DO;  Location: [facility] ENDOSCOPY;  Service: Endoscopy;;   CARDIAC CATHETERIZATION     CATARACT EXTRACTION W/ INTRAOCULAR LENS  IMPLANT, BILATERAL Bilateral    COLONOSCOPY     ERCP N/A 12/13/2022   Procedure: ENDOSCOPIC RETROGRADE CHOLANGIOPANCREATOGRAPHY (ERCP);  Surgeon: Iva Boop, MD;  Location: [facility] ENDOSCOPY;  Service: Gastroenterology;  Laterality: N/A;  with stent placement   ESOPHAGOGASTRODUODENOSCOPY     KNEE ARTHROSCOPY Right 2007   KNEE ARTHROSCOPY  07/13/2011   Procedure: ARTHROSCOPY KNEE;  Surgeon: Erasmo Leventhal;  Location: [facility];  Service: Orthopedics;  Laterality: Left;  partial menisectomy with chondrylplasty   KNEE SURGERY Left    LAMINECTOMY AND MICRODISCECTOMY LUMBAR SPINE  MARCH  2008   L3 -  4   LAPAROSCOPIC INCISIONAL / UMBILICAL / VENTRAL HERNIA REPAIR  2006   PACEMAKER IMPLANT N/A 08/02/2018    Procedure: PACEMAKER IMPLANT;  Surgeon: Marinus Maw, MD;  Location: [facility] INVASIVE CV LAB;  Service: Cardiovascular;  Laterality: N/A;   PROSTATE BIOPSY     RADIOACTIVE SEED IMPLANT N/A 02/12/2019   Procedure: RADIOACTIVE SEED IMPLANT/BRACHYTHERAPY IMPLANT;  Surgeon: Marcine Matar, MD;  Location: [facility] ORS;  Service: Urology;  Laterality: N/A;  90 MINS   SHOULDER ARTHROSCOPY Right 05-23-2007   SIGMOID COLECTOMY FOR CANCER  1989   SPACE OAR INSTILLATION N/A 02/12/2019   Procedure: SPACE OAR INSTILLATION;  Surgeon: Marcine Matar, MD;  Location: [facility] ORS;  Service: Urology;  Laterality: N/A;   SPHINCTEROTOMY  12/13/2022   Procedure: SPHINCTEROTOMY;  Surgeon: Iva Boop, MD;  Location: [facility]  ENDOSCOPY;  Service: Gastroenterology;;   TOTAL KNEE ARTHROPLASTY Left 05/23/2015   Procedure: LEFT TOTAL KNEE ARTHROPLASTY;  Surgeon: Eugenia Mcalpine, MD;  Location: [facility] ORS;  Service: Orthopedics;  Laterality: Left;   VERTICAL BANDED GASTROPLASTY  1986   VIDEO BRONCHOSCOPY N/A 10/30/2019   Procedure: VIDEO BRONCHOSCOPY WITH BRONICAL ALVEROLAR LAVAGE WITHOUT FLUORO;  Surgeon: [name], DO;  Location: [facility] ENDOSCOPY;  Service: Endoscopy;  Laterality: N/A;    Family History  Problem Relation Age of Onset   Stroke Paternal Grandfather    Heart disease Paternal Grandfather    Esophagitis Father        died from perforated esophagus   Breast cancer Mother    Colon cancer Maternal Grandmother        questionable   Esophageal cancer Neg Hx    Prostate cancer Neg Hx    Stomach cancer Neg Hx     Allergies  Allergen Reactions   Contrast Media [Iodinated Contrast Media] Palpitations    TACHYCARDIA- patient states he has tolerated newer agents since this reaction >30 yrs ago    Current Outpatient Medications on File Prior to Visit  Medication Sig Dispense Refill   allopurinol (ZYLOPRIM) 300 MG tablet Take 1 tablet (300 mg total) by mouth daily. 90 tablet 3   amLODipine (NORVASC) 10 MG tablet Take 1  tablet (10 mg total) by mouth daily. 90 tablet 3   apixaban (ELIQUIS) 5 MG TABS tablet Take 1 tablet (5 mg total) by mouth 2 (two) times daily. 60 tablet 0   Calcium Carb-Cholecalciferol (CALCIUM 600 + D PO) Take 1 tablet by mouth daily. 800 units of vitamin D     camphor-menthol (SARNA) lotion Apply topically as needed for itching. 222 mL 0   hydrOXYzine (ATARAX) 50 MG tablet Take 1 tablet (50 mg total) by mouth 3 (three) times daily as needed for itching. 90 tablet 0   Multiple Vitamin (MULTIVITAMIN) tablet Take 1 tablet by mouth in the morning.     polyethylene glycol powder (GLYCOLAX/MIRALAX) 17 GM/SCOOP powder Take 17g (1 capful) by mouth as directed daily as needed for mild constipation. 238 g 0   Respiratory Therapy Supplies (FLUTTER) DEVI 1 each by Does not apply route in the morning and at bedtime. 1 each 0   sodium chloride HYPERTONIC 3 % nebulizer solution Take by nebulization as needed for other. 750 mL 12   tamsulosin (FLOMAX) 0.4 MG CAPS capsule Take 0.4 mg by mouth daily.     No current facility-administered medications on file prior to visit.    BP 110/70   Pulse 60   Temp 98.2 F (36.8 C) (Oral)   Ht 5\' 8"  (1.727 m)   Wt 218 lb (98.9 kg)   SpO2 97%   BMI 33.15 kg/m       Objective:   Physical Exam Vitals and nursing note reviewed.  Constitutional:      Appearance: Normal appearance.  Cardiovascular:     Rate and Rhythm: Normal rate and regular rhythm.     Pulses: Normal pulses.     Heart sounds: Normal heart sounds.  Pulmonary:     Effort: Pulmonary effort is normal.     Breath sounds: Normal breath sounds.  Musculoskeletal:        General: Normal range of motion.  Skin:    General: Skin is warm and dry.     Coloration: Skin is jaundiced.  Neurological:     General: No focal deficit present.  Mental Status: He is alert and oriented to person, place, and time.  Psychiatric:        Mood and Affect: Mood normal.        Behavior: Behavior normal.         Thought Content: Thought content normal.        Judgment: Judgment normal.      Assessment & Plan:  1. Biliary obstruction due to malignant neoplasm [facility]) - Reviewed hospital notes, discharge instructions, labs and imaging and medication changes with patient. All questions answered to the best of my ability.  - Follow up with Oncology and Cardiology as directed  - Comprehensive metabolic panel; Future - Comprehensive metabolic panel  2. Atrial fibrillation, unspecified type (HCC) - Continue with Eliquis 5 mg BID  - Comprehensive metabolic panel; Future - Comprehensive metabolic panel  3. Binocular vision disorder with diplopia - Resolved   4. Coronary artery disease involving native coronary artery of native heart without angina pectoris - Continue statin  - Comprehensive metabolic panel; Future - Comprehensive metabolic panel  5. Essential hypertension - Controlled. No change in medication   6. OSA (obstructive sleep apnea) - Encouraged to wear CPAP   7. Hypokalemia - Recheck today   8. Chronic gout without tophus, unspecified cause, unspecified site - Continue Allopurinol   9. Pruritus - Try increasing Atarax to 100 mg QHS  10. Obesity, unspecified classification, unspecified obesity type, unspecified whether serious comorbidity present  [name], NP

## 2022-12-20 ENCOUNTER — Inpatient Hospital Stay: Payer: Medicare Other | Attending: Oncology | Admitting: Oncology

## 2022-12-20 ENCOUNTER — Inpatient Hospital Stay: Payer: Medicare Other

## 2022-12-20 ENCOUNTER — Encounter: Payer: Self-pay | Admitting: *Deleted

## 2022-12-20 VITALS — BP 121/51 | HR 76 | Temp 98.1°F | Resp 18 | Ht 68.0 in | Wt 217.0 lb

## 2022-12-20 DIAGNOSIS — M109 Gout, unspecified: Secondary | ICD-10-CM | POA: Diagnosis not present

## 2022-12-20 DIAGNOSIS — K831 Obstruction of bile duct: Secondary | ICD-10-CM | POA: Insufficient documentation

## 2022-12-20 DIAGNOSIS — Z9884 Bariatric surgery status: Secondary | ICD-10-CM | POA: Insufficient documentation

## 2022-12-20 DIAGNOSIS — I4891 Unspecified atrial fibrillation: Secondary | ICD-10-CM | POA: Insufficient documentation

## 2022-12-20 DIAGNOSIS — K8689 Other specified diseases of pancreas: Secondary | ICD-10-CM | POA: Diagnosis not present

## 2022-12-20 DIAGNOSIS — G4730 Sleep apnea, unspecified: Secondary | ICD-10-CM | POA: Insufficient documentation

## 2022-12-20 DIAGNOSIS — R748 Abnormal levels of other serum enzymes: Secondary | ICD-10-CM | POA: Diagnosis not present

## 2022-12-20 DIAGNOSIS — C250 Malignant neoplasm of head of pancreas: Secondary | ICD-10-CM | POA: Insufficient documentation

## 2022-12-20 DIAGNOSIS — Z87891 Personal history of nicotine dependence: Secondary | ICD-10-CM

## 2022-12-20 DIAGNOSIS — I442 Atrioventricular block, complete: Secondary | ICD-10-CM | POA: Diagnosis not present

## 2022-12-20 DIAGNOSIS — I10 Essential (primary) hypertension: Secondary | ICD-10-CM | POA: Insufficient documentation

## 2022-12-20 DIAGNOSIS — Z85038 Personal history of other malignant neoplasm of large intestine: Secondary | ICD-10-CM | POA: Diagnosis not present

## 2022-12-20 DIAGNOSIS — I2510 Atherosclerotic heart disease of native coronary artery without angina pectoris: Secondary | ICD-10-CM | POA: Diagnosis not present

## 2022-12-20 DIAGNOSIS — J479 Bronchiectasis, uncomplicated: Secondary | ICD-10-CM | POA: Diagnosis not present

## 2022-12-20 DIAGNOSIS — Z950 Presence of cardiac pacemaker: Secondary | ICD-10-CM

## 2022-12-20 DIAGNOSIS — K219 Gastro-esophageal reflux disease without esophagitis: Secondary | ICD-10-CM | POA: Diagnosis not present

## 2022-12-20 DIAGNOSIS — Z800 Family history of malignant neoplasm of digestive organs: Secondary | ICD-10-CM

## 2022-12-20 DIAGNOSIS — Z803 Family history of malignant neoplasm of breast: Secondary | ICD-10-CM

## 2022-12-20 LAB — SAMPLE TO BLOOD BANK

## 2022-12-20 LAB — CBC WITH DIFFERENTIAL (CANCER CENTER ONLY)
Abs Immature Granulocytes: 0.13 10*3/uL — ABNORMAL HIGH (ref 0.00–0.07)
Basophils Absolute: 0 10*3/uL (ref 0.0–0.1)
Basophils Relative: 0 %
Eosinophils Absolute: 0.4 10*3/uL (ref 0.0–0.5)
Eosinophils Relative: 6 %
HCT: 31.5 % — ABNORMAL LOW (ref 39.0–52.0)
Hemoglobin: 10.1 g/dL — ABNORMAL LOW (ref 13.0–17.0)
Immature Granulocytes: 2 %
Lymphocytes Relative: 16 %
Lymphs Abs: 1.2 10*3/uL (ref 0.7–4.0)
MCH: 30 pg (ref 26.0–34.0)
MCHC: 32.1 g/dL (ref 30.0–36.0)
MCV: 93.5 fL (ref 80.0–100.0)
Monocytes Absolute: 0.7 10*3/uL (ref 0.1–1.0)
Monocytes Relative: 9 %
Neutro Abs: 5.2 10*3/uL (ref 1.7–7.7)
Neutrophils Relative %: 67 %
Platelet Count: 226 10*3/uL (ref 150–400)
RBC: 3.37 MIL/uL — ABNORMAL LOW (ref 4.22–5.81)
RDW: 16.1 % — ABNORMAL HIGH (ref 11.5–15.5)
WBC Count: 7.8 10*3/uL (ref 4.0–10.5)
nRBC: 0 % (ref 0.0–0.2)

## 2022-12-20 NOTE — Progress Notes (Signed)
PATIENT NAVIGATOR PROGRESS NOTE  Name: Marcus [name] Date: 12/20/2022 MRN: [id number]  DOB: 1940/08/22   Reason for visit:  New pt appt  Comments:  Met with Mr Conteh and his daughters during visit with Dr Truett Perna Given information on possible chemo if biopsy proves pancreatic cancer for Gemzar and Abraxane Reviewed PAC placement with him Referral made to Genetics Referral made to SW Referral made to Nutrition Will present him at GI conference on 6/26 Gave contact information to call with any issues or questions He will have ERCP on Thursday for tissue biopsy    Time spent counseling/coordinating care: > 60 minutes

## 2022-12-20 NOTE — Progress Notes (Signed)
[facility] New Patient Consult   Requesting MD: Almon Hercules, Md [address] [place],  [place] [address]   Marcus [name] 82 y.o.  03-23-1941    Reason for Consult: Pancreas mass   HPI: Marcus [name] saw his primary provider last month with unintentional weight loss.  He also reports urinary incontinence.  A chemistry panel on 11/24/2022 was remarkable for elevated liver enzymes and bilirubin.  A repeat liver panel on 12/10/2022 found the liver enzymes to be higher and the bilirubin returned at 7.1.  He was referred to the emergency room 12/10/2022 for hospital admission.  A CT abdomen/pelvis on 12/03/2022 revealed intrahepatic biliary duct dilation and distention of the gallbladder.  A mass is suspected in the pancreas every 2 cm.  There is probable mass effect on the SMV.  Multiple hypodense liver lesions are indeterminate.  There are new compared to a CT from 2011.  There are changes of prior bariatric surgery.  No peripancreatic or abdominopelvic adenopathy.  No ascites. The PT/INR is markedly elevated.  Coumadin was held. He was taken to an ERCP procedure Dr. Leone Payor on 12/13/2022.  There was marked dilation of the intrahepatic ducts and a 3 cm long distal common bile duct stricture.  Cytology brushings were obtained from the common bile duct.  A plastic stent was placed.  The cytology revealed no malignant cells.  Marcus [name] was discharged from the hospital on 12/15/2022.  He is scheduled for an EUS procedure on 12/23/2022.  Past Medical History:  Diagnosis Date   Acute meniscal tear of knee left   Arthritis    generalized   AV block 08/01/2018   Benign essential hypertension 01/18/2007   Bronchiectasis with acute exacerbation 06/23/2020   Chest pain, atypical 08/23/2014   Chronic cough 10/07/2014   Coronary artery disease    Degeneration of lumbar intervertebral disc 10/14/2017   Diverticulosis of colon 07/07/2005   Elevated PSA 06/19/2014   GERD  (gastroesophageal reflux disease) 01/18/2007   Gout, unspecified 01/18/2007   Hemorrhoids 01/19/2011   Hiatal hernia    History of colonic polyps 01/18/2007   Insomnia, unspecified 08/21/2007   Iron deficiency anemia, unspecified  01/19/2011   Lumbar post-laminectomy syndrome 10/14/2017   Lumbar spondylolysis    Lumbar strain 12/14/2011   Obstructive sleep apnea 01/18/2007   uses CPAP nightly   Paraesophageal hernia    large   Primary osteoarthritis of knee 05/23/2015   Prostate cancer (HCC) 2016   S/P knee replacement 05/23/2015   Sensorineural hearing loss, bilateral    Vitamin B12 deficiency    Vitamin D deficiency 10/28/2009    .  Sigmoid colon cancer                                                                                                      1989  Past Surgical History:  Procedure Laterality Date   BILIARY BRUSHING  12/13/2022   Procedure: BILIARY BRUSHING;  Surgeon: Iva Boop, MD;  Location: [facility] ENDOSCOPY;  Service: Gastroenterology;;   BILIARY STENT PLACEMENT  12/13/2022   Procedure: BILIARY STENT PLACEMENT;  Surgeon: Iva Boop, MD;  Location: [facility] ENDOSCOPY;  Service: Gastroenterology;;   BRONCHIAL WASHINGS  10/30/2019   Procedure: BRONCHIAL WASHINGS;  Surgeon: [name], DO;  Location: [facility] ENDOSCOPY;  Service: Endoscopy;;   CARDIAC CATHETERIZATION     CATARACT EXTRACTION W/ INTRAOCULAR LENS  IMPLANT, BILATERAL Bilateral    COLONOSCOPY     ERCP N/A 12/13/2022   Procedure: ENDOSCOPIC RETROGRADE CHOLANGIOPANCREATOGRAPHY (ERCP);  Surgeon: Iva Boop, MD;  Location: [facility] ENDOSCOPY;  Service: Gastroenterology;  Laterality: N/A;  with stent placement   ESOPHAGOGASTRODUODENOSCOPY     KNEE ARTHROSCOPY Right 2007   KNEE ARTHROSCOPY  07/13/2011   Procedure: ARTHROSCOPY KNEE;  Surgeon: Erasmo Leventhal;  Location: [facility];  Service: Orthopedics;  Laterality: Left;  partial menisectomy with chondrylplasty   KNEE SURGERY Left    LAMINECTOMY  AND MICRODISCECTOMY LUMBAR SPINE  MARCH  2008   L3 -  4   LAPAROSCOPIC INCISIONAL / UMBILICAL / VENTRAL HERNIA REPAIR  2006   PACEMAKER IMPLANT N/A 08/02/2018   Procedure: PACEMAKER IMPLANT;  Surgeon: Marinus Maw, MD;  Location: [facility] INVASIVE CV LAB;  Service: Cardiovascular;  Laterality: N/A;   PROSTATE BIOPSY     RADIOACTIVE SEED IMPLANT N/A 02/12/2019   Procedure: RADIOACTIVE SEED IMPLANT/BRACHYTHERAPY IMPLANT;  Surgeon: Marcine Matar, MD;  Location: [facility] ORS;  Service: Urology;  Laterality: N/A;  90 MINS   SHOULDER ARTHROSCOPY Right 05-23-2007   SIGMOID COLECTOMY FOR CANCER  1989   SPACE OAR INSTILLATION N/A 02/12/2019   Procedure: SPACE OAR INSTILLATION;  Surgeon: Marcine Matar, MD;  Location: [facility] ORS;  Service: Urology;  Laterality: N/A;   SPHINCTEROTOMY  12/13/2022   Procedure: SPHINCTEROTOMY;  Surgeon: Iva Boop, MD;  Location: [facility] ENDOSCOPY;  Service: Gastroenterology;;   TOTAL KNEE ARTHROPLASTY Left 05/23/2015   Procedure: LEFT TOTAL KNEE ARTHROPLASTY;  Surgeon: Eugenia Mcalpine, MD;  Location: [facility] ORS;  Service: Orthopedics;  Laterality: Left;   VERTICAL BANDED GASTROPLASTY  1986   VIDEO BRONCHOSCOPY N/A 10/30/2019   Procedure: VIDEO BRONCHOSCOPY WITH BRONICAL ALVEROLAR LAVAGE WITHOUT FLUORO;  Surgeon: [name], DO;  Location: [facility] ENDOSCOPY;  Service: Endoscopy;  Laterality: N/A;    Medications: Reviewed  Allergies:  Allergies  Allergen Reactions   Contrast Media [Iodinated Contrast Media] Palpitations    TACHYCARDIA- patient states he has tolerated newer agents since this reaction >30 yrs ago    Family history: His mother breast cancer.  Maternal grandmother had colon cancer  Social History:   He lives with his son in [place].  He is a retired Associate Professor.  He quit smoking cigarettes 30 years ago.  2 to 3 glasses of wine per night.  No transfusion history.  No risk factor for HIV or hepatitis.  ROS:   Positives include: Solid dysphagia for years,  exertional dyspnea, cough when he eats late at night, urinary incontinence for several weeks, loose black stool today, diplopia following the ERCP procedure lasting for 1/2-day, pruritus for the past 2 weeks, anorexia, 30 pound weight loss over 4 months  A complete ROS was otherwise negative.  Physical Exam:  Blood pressure (!) 121/51, pulse 76, temperature 98.1 F (36.7 C), temperature source Oral, resp. rate 18, height 5\' 8"  (1.727 m), weight 217 lb (98.4 kg), SpO2 100 %.  HEENT: Oropharynx without visible mass, neck without mass, scleral icterus Lungs: Clear bilaterally Cardiac: Irregular Abdomen: Mass, no apparent ascites, no hepatosplenomegaly  Vascular: Trace pitting edema to  lower leg bilaterally Lymph nodes: No cervical, supraclavicular, axillary, or inguinal nodes Neurologic: Alert and oriented, the motor exam appears intact in the upper and lower extremities bilaterally Skin: Jaundice, no rash Musculoskeletal: No spine tenderness   LAB:  CBC  Lab Results  Component Value Date   WBC 7.8 12/20/2022   HGB 10.1 (L) 12/20/2022   HCT 31.5 (L) 12/20/2022   MCV 93.5 12/20/2022   PLT 226 12/20/2022   NEUTROABS 5.2 12/20/2022        CMP  Lab Results  Component Value Date   NA 140 12/17/2022   K 4.1 12/17/2022   CL 106 12/17/2022   CO2 26 12/17/2022   GLUCOSE 106 (H) 12/17/2022   BUN 30 (H) 12/17/2022   CREATININE 1.11 12/17/2022   CALCIUM 9.0 12/17/2022   PROT 6.3 12/17/2022   ALBUMIN 3.4 (L) 12/17/2022   AST 181 (H) 12/17/2022   ALT 320 (H) 12/17/2022   ALKPHOS 539 (H) 12/17/2022   BILITOT 4.1 (H) 12/17/2022   GFRNONAA >60 12/15/2022   GFRAA >60 02/07/2019     Lab Results  Component Value Date   CEA1 6.4 (H) 12/11/2022   ZOX096 1,667 (H) 12/11/2022    Imaging:  As per HPI, CT images from 12/03/2022 reviewed    Assessment/Plan:   Pancreas mass CT abdomen/pelvis 12/03/2022-suspected pancreas mass in the proximal body measuring 2 cm with probable  mass effect on the superior aspect of the SMV, multiple hypodense liver lesions, many are new from a remote CT-indeterminate, 2 cystic lesions in the pancreas tail-nonspecific prior bariatric surgery ERCP 12/13/2022-malignant appearing common bile duct stricture, plastic stent placed, brushing cytology-no malignant cells Biliary obstruction secondary to 1-status post ERCP/stent placement 12/13/2022 Coronary artery disease Atrial fibrillation Gout Sleep apnea Status post bariatric surgery Hypertension Bronchiectasis Gastroesophageal reflux disease History of colon cancer in his 40s Complete heart block-pacemaker placed   Disposition:   Marcus [name] was admitted 12/10/2022 with obstructive jaundice.  He appears to have a pancreas mass with a malignant common duct stricture.  I reviewed the probable diagnosis of pancreas cancer with Marcus [name] daughters.  We reviewed the CT findings and images.  There are indeterminate liver lesions which could indicate metastases.  He is scheduled to undergo a diagnostic EUS later this week.  We discussed potential treatment options including systemic chemotherapy.  He does not appear to be a surgical candidate.  I will present his case at the GI tumor conference on 12/29/2022.  He will return for an office visit and further discussion that day.  Hopefully the pruritus will improve over the next few days.  He reports black stool today.  He will return to the lab to check a CBC.  He is now maintained on apixaban  anticoagulation for prophylaxis in the setting of atrial fibrillation.  He will hold apixaban for the procedure later this week.  We will consider discontinuing apixaban if he has significant GI bleeding.  Marcus Papas, MD  12/20/2022, 4:31 PM

## 2022-12-20 NOTE — Progress Notes (Signed)
Mr. Paula Libra is accompanied today for visit with his two daughters.

## 2022-12-21 ENCOUNTER — Telehealth: Payer: Self-pay

## 2022-12-21 ENCOUNTER — Inpatient Hospital Stay: Payer: Medicare Other | Admitting: Licensed Clinical Social Worker

## 2022-12-21 NOTE — Progress Notes (Signed)
[facility] Clinical Social Work  Initial Assessment   Marcus [name] is a 82 y.o. year old male contacted by phone. Clinical Social Work was referred by nurse navigator for assessment of psychosocial needs.   SDOH (Social Determinants of Health) assessments performed: Yes SDOH Interventions    Flowsheet Row Office Visit from 12/20/2022 in [facility] Telephone from 12/16/2022 in [facility] Coordination Video Visit from 08/10/2022 in [facility] [place] Clinical Support from 08/04/2020 in [facility] [place] Chronic Care Management from 04/10/2020 in [facility] [place]  SDOH Interventions       Food Insecurity Interventions Intervention Not Indicated Intervention Not Indicated -- Intervention Not Indicated --  Housing Interventions Intervention Not Indicated -- -- Intervention Not Indicated --  Transportation Interventions -- Intervention Not Indicated -- Intervention Not Indicated Intervention Not Indicated  Utilities Interventions Intervention Not Indicated -- -- -- --  Depression Interventions/Treatment  -- -- Patient refuses Treatment Patient refuses Treatment --  Financial Strain Interventions -- -- -- Intervention Not Indicated Intervention Not Indicated  Physical Activity Interventions -- -- -- Intervention Not Indicated --  Stress Interventions -- -- -- Intervention Not Indicated --  Social Connections Interventions -- -- -- Intervention Not Indicated --       SDOH Screenings   Food Insecurity: No Food Insecurity (12/20/2022)  Housing: Low Risk  (12/20/2022)  Transportation Needs: No Transportation Needs (12/20/2022)  Utilities: Not At Risk (12/20/2022)  Alcohol Screen: Low Risk  (08/04/2020)  Depression (PHQ2-9): High Risk (11/30/2022)  Financial Resource Strain: Low Risk  (07/28/2021)  Physical Activity: Inactive (07/28/2021)  Social Connections:  Moderately Isolated (08/04/2020)  Stress: No Stress Concern Present (07/28/2021)  Tobacco Use: Medium Risk (12/17/2022)     Distress Screen completed: No     No data to display            Family/Social Information:  Housing Arrangement: patient lives alone. Family members/support persons in your life? Family and Friends Transportation concerns: no  Employment: Retired  Income source: Actor concerns: No Type of concern: None Food access concerns: no Religious or spiritual practice: Software engineer Currently in place:  [employer]  Coping/ Adjustment to diagnosis: Patient understands treatment plan and what happens next? yes Concerns about diagnosis and/or treatment:  Patient did not express any concerns. Patient reported stressors:  Patient denied stressors. Hopes and/or priorities: Family Patient enjoys time with family/ friends Current coping skills/ strengths: Average or above average intelligence , Capable of independent living , Communication skills , Financial means , General fund of knowledge , and Supportive family/friends     SUMMARY: Current SDOH Barriers:  Patient denied any barriers.  Clinical Social Work Clinical Goal(s):  No clinical social work goals at this time  Interventions: Discussed common feeling and emotions when being diagnosed with cancer, and the importance of support during treatment Informed patient of the support team roles and support services at [facility] Provided CSW contact information and encouraged patient to call with any questions or concerns Provided patient with information about role of the CSW.   Follow Up Plan: Patient will contact CSW with any support or resource needs Patient verbalizes understanding of plan: Yes    Dorothey Baseman, LCSW Clinical Social Worker [facility]

## 2022-12-21 NOTE — Telephone Encounter (Signed)
CSW attempted to contact patient per the new patient protocol and referral from the nurse navigator.  Left vm.

## 2022-12-22 ENCOUNTER — Other Ambulatory Visit: Payer: Self-pay | Admitting: Family Medicine

## 2022-12-22 ENCOUNTER — Telehealth: Payer: Self-pay | Admitting: Oncology

## 2022-12-22 ENCOUNTER — Telehealth: Payer: Self-pay | Admitting: *Deleted

## 2022-12-22 DIAGNOSIS — D649 Anemia, unspecified: Secondary | ICD-10-CM

## 2022-12-22 DIAGNOSIS — K8689 Other specified diseases of pancreas: Secondary | ICD-10-CM

## 2022-12-22 NOTE — Telephone Encounter (Signed)
LVM with MD message and sent MyChart message. Confirmed w/endo suite at [facility] they will draw his CBC and T&S.

## 2022-12-22 NOTE — Telephone Encounter (Signed)
-----   Message from [name], MD sent at 12/20/2022  5:17 PM EDT ----- Please call patient, the hemoglobin is lower compared to when he left the hospital, call for symptoms of anemia, call for persistent black stool, check CBC and blood bank sample when he is seen for the EUS on 12/23/2022

## 2022-12-22 NOTE — Telephone Encounter (Signed)
Referral per Dr. Truett Perna had been made, currently patient has denied due to scheduling and also wanting to talk further with this provider. Patient has stated they will call back when they are ready to start

## 2022-12-23 ENCOUNTER — Ambulatory Visit (HOSPITAL_COMMUNITY)
Admission: RE | Admit: 2022-12-23 | Discharge: 2022-12-23 | Disposition: A | Discharge status: Home / Self Care | Payer: Medicare Other | Attending: Gastroenterology | Admitting: Gastroenterology

## 2022-12-23 ENCOUNTER — Other Ambulatory Visit: Payer: Self-pay

## 2022-12-23 ENCOUNTER — Telehealth: Payer: Self-pay | Admitting: Pulmonary Disease

## 2022-12-23 ENCOUNTER — Ambulatory Visit (HOSPITAL_COMMUNITY): Payer: Medicare Other | Admitting: Anesthesiology

## 2022-12-23 ENCOUNTER — Encounter (HOSPITAL_COMMUNITY): Payer: Self-pay | Admitting: Gastroenterology

## 2022-12-23 ENCOUNTER — Telehealth: Payer: Self-pay | Admitting: *Deleted

## 2022-12-23 ENCOUNTER — Ambulatory Visit (HOSPITAL_BASED_OUTPATIENT_CLINIC_OR_DEPARTMENT_OTHER): Payer: Medicare Other | Admitting: Anesthesiology

## 2022-12-23 ENCOUNTER — Encounter (HOSPITAL_COMMUNITY)
Admission: RE | Disposition: A | Discharge status: Home / Self Care | Payer: Self-pay | Source: Home / Self Care | Attending: Gastroenterology

## 2022-12-23 DIAGNOSIS — Z9689 Presence of other specified functional implants: Secondary | ICD-10-CM | POA: Diagnosis not present

## 2022-12-23 DIAGNOSIS — K2289 Other specified disease of esophagus: Secondary | ICD-10-CM | POA: Diagnosis not present

## 2022-12-23 DIAGNOSIS — K2210 Ulcer of esophagus without bleeding: Secondary | ICD-10-CM

## 2022-12-23 DIAGNOSIS — Z980 Intestinal bypass and anastomosis status: Secondary | ICD-10-CM | POA: Diagnosis not present

## 2022-12-23 DIAGNOSIS — Z87891 Personal history of nicotine dependence: Secondary | ICD-10-CM | POA: Insufficient documentation

## 2022-12-23 DIAGNOSIS — I2510 Atherosclerotic heart disease of native coronary artery without angina pectoris: Secondary | ICD-10-CM | POA: Diagnosis not present

## 2022-12-23 DIAGNOSIS — K7689 Other specified diseases of liver: Secondary | ICD-10-CM

## 2022-12-23 DIAGNOSIS — K3189 Other diseases of stomach and duodenum: Secondary | ICD-10-CM | POA: Insufficient documentation

## 2022-12-23 DIAGNOSIS — D649 Anemia, unspecified: Secondary | ICD-10-CM

## 2022-12-23 DIAGNOSIS — K838 Other specified diseases of biliary tract: Secondary | ICD-10-CM | POA: Diagnosis not present

## 2022-12-23 DIAGNOSIS — K2100 Gastro-esophageal reflux disease with esophagitis, without bleeding: Secondary | ICD-10-CM | POA: Diagnosis not present

## 2022-12-23 DIAGNOSIS — K449 Diaphragmatic hernia without obstruction or gangrene: Secondary | ICD-10-CM | POA: Insufficient documentation

## 2022-12-23 DIAGNOSIS — I119 Hypertensive heart disease without heart failure: Secondary | ICD-10-CM | POA: Diagnosis not present

## 2022-12-23 DIAGNOSIS — G4733 Obstructive sleep apnea (adult) (pediatric): Secondary | ICD-10-CM | POA: Diagnosis not present

## 2022-12-23 DIAGNOSIS — I899 Noninfective disorder of lymphatic vessels and lymph nodes, unspecified: Secondary | ICD-10-CM | POA: Diagnosis not present

## 2022-12-23 DIAGNOSIS — I10 Essential (primary) hypertension: Secondary | ICD-10-CM | POA: Diagnosis not present

## 2022-12-23 DIAGNOSIS — Z9989 Dependence on other enabling machines and devices: Secondary | ICD-10-CM | POA: Diagnosis not present

## 2022-12-23 DIAGNOSIS — C250 Malignant neoplasm of head of pancreas: Secondary | ICD-10-CM | POA: Diagnosis not present

## 2022-12-23 DIAGNOSIS — K8689 Other specified diseases of pancreas: Secondary | ICD-10-CM | POA: Diagnosis not present

## 2022-12-23 DIAGNOSIS — Z950 Presence of cardiac pacemaker: Secondary | ICD-10-CM | POA: Insufficient documentation

## 2022-12-23 DIAGNOSIS — I4891 Unspecified atrial fibrillation: Secondary | ICD-10-CM | POA: Diagnosis not present

## 2022-12-23 HISTORY — PX: EUS: SHX5427

## 2022-12-23 HISTORY — PX: FINE NEEDLE ASPIRATION: SHX5430

## 2022-12-23 HISTORY — PX: BIOPSY: SHX5522

## 2022-12-23 HISTORY — PX: ESOPHAGOGASTRODUODENOSCOPY (EGD) WITH PROPOFOL: SHX5813

## 2022-12-23 LAB — COMPREHENSIVE METABOLIC PANEL
ALT: 190 U/L — ABNORMAL HIGH (ref 0–44)
AST: 96 U/L — ABNORMAL HIGH (ref 15–41)
Albumin: 2.7 g/dL — ABNORMAL LOW (ref 3.5–5.0)
Alkaline Phosphatase: 387 U/L — ABNORMAL HIGH (ref 38–126)
Anion gap: 6 (ref 5–15)
BUN: 30 mg/dL — ABNORMAL HIGH (ref 8–23)
CO2: 21 mmol/L — ABNORMAL LOW (ref 22–32)
Calcium: 8.2 mg/dL — ABNORMAL LOW (ref 8.9–10.3)
Chloride: 109 mmol/L (ref 98–111)
Creatinine, Ser: 1 mg/dL (ref 0.61–1.24)
GFR, Estimated: >60 mL/min (ref >60)
Glucose, Bld: 104 mg/dL — ABNORMAL HIGH (ref 70–99)
Potassium: 3.6 mmol/L (ref 3.5–5.1)
Sodium: 136 mmol/L (ref 135–145)
Total Bilirubin: 2.3 mg/dL — ABNORMAL HIGH (ref 0.3–1.2)
Total Protein: 5.5 g/dL — ABNORMAL LOW (ref 6.5–8.1)

## 2022-12-23 LAB — CBC WITH DIFFERENTIAL/PLATELET
Abs Immature Granulocytes: 0.18 10*3/uL — ABNORMAL HIGH (ref 0.00–0.07)
Basophils Absolute: 0 10*3/uL (ref 0.0–0.1)
Basophils Relative: 1 %
Eosinophils Absolute: 0.3 10*3/uL (ref 0.0–0.5)
Eosinophils Relative: 5 %
HCT: 26.9 % — ABNORMAL LOW (ref 39.0–52.0)
Hemoglobin: 8.6 g/dL — ABNORMAL LOW (ref 13.0–17.0)
Immature Granulocytes: 3 %
Lymphocytes Relative: 21 %
Lymphs Abs: 1.2 10*3/uL (ref 0.7–4.0)
MCH: 30.4 pg (ref 26.0–34.0)
MCHC: 32 g/dL (ref 30.0–36.0)
MCV: 95.1 fL (ref 80.0–100.0)
Monocytes Absolute: 0.5 10*3/uL (ref 0.1–1.0)
Monocytes Relative: 8 %
Neutro Abs: 3.5 10*3/uL (ref 1.7–7.7)
Neutrophils Relative %: 62 %
Platelets: 216 10*3/uL (ref 150–400)
RBC: 2.83 MIL/uL — ABNORMAL LOW (ref 4.22–5.81)
RDW: 15.9 % — ABNORMAL HIGH (ref 11.5–15.5)
WBC: 5.7 10*3/uL (ref 4.0–10.5)
nRBC: 0 % (ref 0.0–0.2)

## 2022-12-23 LAB — TYPE AND SCREEN
ABO/RH(D): A POS
Antibody Screen: NEGATIVE

## 2022-12-23 SURGERY — UPPER ENDOSCOPIC ULTRASOUND (EUS) RADIAL
Anesthesia: Monitor Anesthesia Care

## 2022-12-23 MED ORDER — DEXAMETHASONE SODIUM PHOSPHATE 10 MG/ML IJ SOLN
INTRAMUSCULAR | Status: DC | PRN
  Administered 2022-12-23: 4 mg via INTRAVENOUS

## 2022-12-23 MED ORDER — PROPOFOL 10 MG/ML IV BOLUS
INTRAVENOUS | Status: DC | PRN
  Administered 2022-12-23: 100 mg via INTRAVENOUS

## 2022-12-23 MED ORDER — SODIUM CHLORIDE 0.9 % IV SOLN: INTRAVENOUS | Status: DC

## 2022-12-23 MED ORDER — APIXABAN 5 MG PO TABS: 5.0000 mg | ORAL_TABLET | Freq: Two times a day (BID) | ORAL | 0 refills | Status: DC

## 2022-12-23 MED ORDER — PROPOFOL 500 MG/50ML IV EMUL
INTRAVENOUS | Status: DC | PRN
  Administered 2022-12-23: 90 ug/kg/min via INTRAVENOUS

## 2022-12-23 MED ORDER — LACTATED RINGERS IV SOLN: INTRAVENOUS | Status: DC

## 2022-12-23 MED ORDER — ESOMEPRAZOLE MAGNESIUM 40 MG PO CPDR: 40.0000 mg | DELAYED_RELEASE_CAPSULE | Freq: Two times a day (BID) | ORAL | 12 refills | Status: DC

## 2022-12-23 MED ORDER — CIPROFLOXACIN IN D5W 400 MG/200ML IV SOLN
INTRAVENOUS | Status: AC
  Filled 2022-12-23: qty 200

## 2022-12-23 MED ORDER — SUCRALFATE 1 GM/10ML PO SUSP
1.0000 g | Freq: Two times a day (BID) | ORAL | 2 refills | Status: DC
  Filled 2023-03-18: qty 420, 21d supply, fill #0

## 2022-12-23 MED ORDER — LIDOCAINE HCL (PF) 2 % IJ SOLN
INTRAMUSCULAR | Status: DC | PRN
  Administered 2022-12-23: 100 mg via INTRADERMAL

## 2022-12-23 MED ORDER — CIPROFLOXACIN IN D5W 400 MG/200ML IV SOLN
INTRAVENOUS | Status: DC | PRN
  Administered 2022-12-23: 400 mg via INTRAVENOUS

## 2022-12-23 MED ORDER — PROPOFOL 1000 MG/100ML IV EMUL
INTRAVENOUS | Status: AC
  Filled 2022-12-23: qty 100

## 2022-12-23 MED ORDER — ONDANSETRON HCL 4 MG/2ML IJ SOLN
INTRAMUSCULAR | Status: DC | PRN
  Administered 2022-12-23: 4 mg via INTRAVENOUS

## 2022-12-23 NOTE — Telephone Encounter (Signed)
Patient would like copy of sleep study. [facility] for [facility]. Patient phone number is [phone].

## 2022-12-23 NOTE — Transfer of Care (Signed)
Immediate Anesthesia Transfer of Care Note  Patient: Marcus [name]  Procedure(s) Performed: UPPER ENDOSCOPIC ULTRASOUND (EUS) RADIAL ESOPHAGOGASTRODUODENOSCOPY (EGD) WITH PROPOFOL BIOPSY FINE NEEDLE ASPIRATION (FNA) RADIAL  Patient Location: PACU and Endoscopy Unit  Anesthesia Type:MAC  Level of Consciousness: awake, alert , oriented, and patient cooperative  Airway & Oxygen Therapy: Patient Spontanous Breathing and Patient connected to face mask oxygen  Post-op Assessment: Report given to RN, Post -op Vital signs reviewed and stable, and Patient moving all extremities  Post vital signs: Reviewed and stable  Last Vitals:  Vitals Value Taken Time  BP 112/52 12/23/22 1445  Temp    Pulse 62 12/23/22 1446  Resp 18 12/23/22 1446  SpO2 100 % 12/23/22 1446  Vitals shown include unvalidated device data.  Last Pain:  Vitals:   12/23/22 1128  TempSrc: Temporal  PainSc: 0-No pain         Complications: No notable events documented.

## 2022-12-23 NOTE — Telephone Encounter (Signed)
Per Dr. Truett Perna: Since Hgb has decreased by almost 2 grams in 1 week, he suggests a lab tomorrow. Also needs to continue to hold Eliquis. Left this information on his daughter's voice mail and also sent via MyChart.

## 2022-12-23 NOTE — Op Note (Addendum)
[facility] Patient Name: Marcus [name] Procedure Date: 12/23/2022 MRN: [id number] Attending MD: Corliss Parish , MD, [id number] Date of Birth: Nov 27, 1940 CSN: [id number] Age: 82 Admit Type: Outpatient Procedure:                Upper EUS Indications:              Suspected mass in pancreas on CT scan Providers:                Corliss Parish, MD, Fransisca Connors, Kandice Robinsons, Technician Referring MD:             Iva Boop, MD, [name], Leighton Roach Sherrill Medicines:                Monitored Anesthesia Care Complications:            No immediate complications. Estimated Blood Loss:     Estimated blood loss was minimal. Procedure:                Pre-Anesthesia Assessment:                           - Prior to the procedure, a History and Physical                            was performed, and patient medications and                            allergies were reviewed. The patient's tolerance of                            previous anesthesia was also reviewed. The risks                            and benefits of the procedure and the sedation                            options and risks were discussed with the patient.                            All questions were answered, and informed consent                            was obtained. Prior Anticoagulants: The patient has                            taken Eliquis (apixaban), last dose was 3 days                            prior to procedure. ASA Grade Assessment: III - A                            patient with severe systemic disease. After  reviewing the risks and benefits, the patient was                            deemed in satisfactory condition to undergo the                            procedure.                           After obtaining informed consent, the endoscope was                            passed under direct vision. Throughout the                             procedure, the patient's blood pressure, pulse, and                            oxygen saturations were monitored continuously. The                            [id number]-[id number] ([id number]) Olympus endoscope was introduced                            through the mouth, and advanced to the second part                            of duodenum. The Linear GF- [id number] ) was                            introduced through the mouth, and advanced to the                            duodenum for ultrasound examination from the                            stomach and duodenum. The upper EUS was                            accomplished without difficulty. The patient                            tolerated the procedure. Scope In: Scope Out: Findings:      ENDOSCOPIC FINDING: :      No gross lesions were noted in the proximal esophagus.      Seven cratered esophageal ulcers with no bleeding and no stigmata of       recent bleeding were found 35 to 42 cm from the incisors. The largest       lesion was 18 mm in largest dimension. Biopsies were taken with a cold       forceps for histology.      Mildly severe esophagitis with no bleeding was found at the       gastroesophageal junction.      The Z-line was irregular and was found 42  cm from the incisors.      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 It is       approximately 8 cm in length this was characterized by friable mucosa       and mild stenosis, though EGD and Echoendoscope traversed the region       with mucosal wrent noted.      No other gross lesions were noted in the entire examined stomach (this       patient's anatomy is almost likely a partial gastric sleeve but with       widely enlarged remaining stomach.      A stenotic deformity was found at the pylorus which led to great       difficulty with passage of the Echoendoscope but eventually allowed this       and mucosal wrenting was noted.      No gross  lesions were noted in the duodenal sweep and in the second       portion of the duodenum.      A previously placed plastic biliary stent was seen in the area of the       papilla.      ENDOSONOGRAPHIC FINDING: :      An irregular masslike region was identified in the pancreatic head. The       area was hypoechoic. The area measured 30 mm by 33 mm in maximal       cross-sectional diameter. The endosonographic borders were       poorly-defined. There was sonographic evidence suggesting invasion into       the superior mesenteric vein (manifested by encasement). The proximal       aspect of the superior mesenteric artery did not seem to show any       abnormality. However distal aspect of the superior mesenteric artery was       difficult to visualize as result of the patient's previous anatomy, so I       cannot comment officially on that blood vessel. An intact interface was       seen between the mass and the celiac trunk suggesting a lack of       invasion. The remainder of the pancreas was examined. The       endosonographic appearance of parenchyma and the upstream pancreatic       duct indicated duct dilation (PD in the neck/body/tail was as enlarged       as 10 to 11 mm), prominent ductal side-branches, cystic appearance       within the tail with multiple cysts, a tortuous/ectatic duct,       hyperechoic ductal walls and parenchymal atrophy. Fine needle biopsy was       performed of the masslike area in the head right where the pancreas duct       begins to dilate. Color Doppler imaging was utilized prior to needle       puncture to confirm a lack of significant vascular structures within the       needle path. Seven passes were made with the 22 gauge Acquire biopsy       needle using a transduodenal approach. Visible cores of tissue were       obtained. Preliminary cytologic examination and touch preps were       performed. Final cytology results are pending.      One stent was  visualized endosonographically in the common bile duct.  Extension of the stent was noted in the common bile duct.      Extensive hyperechoic material consistent with sludge was visualized       endosonographically in the gallbladder.      Endosonographic imaging of the ampulla showed no intramural       (subepithelial) lesion.      No malignant-appearing lymph nodes were visualized in the celiac region       (level 20), peripancreatic region and porta hepatis region.      Anechoic lesions suggestive of four cysts were identified in the       visualized portion of the liver. The largest lesion measured 8 mm by 9       mm in maximal cross-sectional diameter. There was no associated mass or       lesion noted throughout the rest of the visualized left portion of the       liver. However, I do comment that I could not visualize what would       normally be the extent of findings as a result of the patient's anatomy.      The celiac region was visualized. Impression:               EGD impression:                           - No gross lesions in the proximal esophagus.                           - Esophageal ulcers with no bleeding and no                            stigmata of recent bleeding were found distally.                            Biopsied.                           - Mildly severe esophagitis with no bleeding at the                            GE junction.                           - Z-line irregular, 42 cm from the incisors.                           - A gastrogastrostomy was found, characterized by                            friable mucosa and stenosis with mucosal wrent                            after passage of the echoendoscope.                           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                           - Stenosis deformity in the pylorus  with mucosal                            wrent after echoendoscope passage.                           - No gross lesions in the duodenal sweep and in the                            second portion of the duodenum.                           - Plastic biliary stent at the major papilla.                           EUS impression:                           - A masslike region was identified in the                            pancreatic head. Cytology results are pending.                            However, the endosonographic appearance is                            suspicious for adenocarcinoma. This was staged T2                            N0 Mx by endosonographic criteria. There is SMV                            invasion. Also, although I do not see evidence of                            the SMA approximately is being involved, the                            patient's anatomy makes the vasculature evaluation                            difficult. The staging applies if malignancy is                            confirmed. Fine needle biopsy performed of the mass  area.                           - One stent was visualized endosonographically in                            the common bile duct.                           - Significant hyperechoic material consistent with                            sludge was visualized endosonographically in the                            gallbladder.                           - No malignant-appearing lymph nodes were                            visualized in the celiac region (level 20),                            peripancreatic region and porta hepatis region.                           - Four cystic lesions were found in the visualized                            portion of the liver, the largest measuring 8 mm by                            9 mm. I did not visualize a solid lesion to sample. Moderate Sedation:      Not Applicable - Patient  had care per Anesthesia. Recommendation:           - The patient will be observed post-procedure,                            until all discharge criteria are met.                           - Discharge patient to home.                           - Patient has a contact number available for                            emergencies. The signs and symptoms of potential                            delayed complications were discussed with the                            patient. Return to normal activities tomorrow.  Written discharge instructions were provided to the                            patient.                           - Low fat diet.                           - Observe patient's clinical course.                           - PPI 40 mg twice daily to be initiated.                           - Carafate liquid twice daily for 2 months.                           - Eliquis restart on 6/22 to decrease                            post-interventional bleeding risk.                           - Observe patient's clinical course.                           - Await cytology results and await path results.                           - For optimal visualization of the patient's                            vasculature, recommend MRI abdomen with contrast.                           - The findings and recommendations were discussed                            with the patient.                           - The findings and recommendations were discussed                            with the patient. Procedure Code(s):        --- Professional ---                           [phone], Esophagogastroduodenoscopy, flexible,                            transoral; with transendoscopic ultrasound-guided                            intramural or transmural fine needle  aspiration/biopsy(s), (includes endoscopic                            ultrasound examination limited to the  esophagus,                            stomach or duodenum, and adjacent structures)                           43239, 59, Esophagogastroduodenoscopy, flexible,                            transoral; with biopsy, single or multiple Diagnosis Code(s):        --- Professional ---                           K22.10, Ulcer of esophagus without bleeding                           K20.90, Esophagitis, unspecified without bleeding                           K22.89, Other specified disease of esophagus                           Z98.0, Intestinal bypass and anastomosis status                           K31.89, Other diseases of stomach and duodenum                           K86.89, Other specified diseases of pancreas                           I89.9, Noninfective disorder of lymphatic vessels                            and lymph nodes, unspecified                           K76.89, Other specified diseases of liver                           K83.8, Other specified diseases of biliary tract                           R93.3, Abnormal findings on diagnostic imaging of                            other parts of digestive tract CPT copyright 2022 [employer]. All rights reserved. The codes documented in this report are preliminary and upon coder review may  be revised to meet current compliance requirements. Corliss Parish, MD 12/23/2022 2:53:05 PM Number of Addenda: 0

## 2022-12-23 NOTE — Anesthesia Preprocedure Evaluation (Addendum)
Anesthesia Evaluation  Patient identified by MRN, date of birth, ID band Patient awake    Reviewed: Allergy & Precautions, NPO status , Patient's Chart, lab work & pertinent test results  Airway Mallampati: II  TM Distance: >3 FB Neck ROM: Full    Dental  (+) Dental Advisory Given,    Pulmonary sleep apnea and Continuous Positive Airway Pressure Ventilation , former smoker   Pulmonary exam normal breath sounds clear to auscultation       Cardiovascular hypertension, Pt. on medications + CAD  Normal cardiovascular exam+ dysrhythmias Atrial Fibrillation + pacemaker  Rhythm:Regular Rate:Normal  Echo 12/14/2022  1. LVEF mildly reduced with apical motion consistent with RV pacing. Left ventricular ejection fraction, by estimation, is 45 to 50%. The left ventricle has mildly decreased function. The left ventricle has no regional wall motion abnormalities. There is mild concentric left ventricular hypertrophy. Left ventricular diastolic parameters are consistent with Grade I diastolic dysfunction (impaired relaxation).   2. Right ventricular systolic function is normal. The right ventricular size is normal. Tricuspid regurgitation signal is inadequate for assessing PA pressure.   3. The mitral valve is grossly normal. Trivial mitral valve regurgitation. No evidence of mitral stenosis.   4. The aortic valve was not well visualized. Aortic valve regurgitation is not visualized. No aortic stenosis is present.   5. The inferior vena cava is normal in size with greater than 50% respiratory variability, suggesting right atrial pressure of 3 mmHg.   Conclusion(s)/Recommendation(s): No intracardiac source of embolism detected on this transthoracic study. Consider a transesophageal echocardiogram to exclude cardiac source of embolism if clinically indicated.    Pacemaker interrogation Placed for 3rd deg HB AS-VS 27%, AS-VP 24%, AP-VP 37%    Neuro/Psych  Neuromuscular disease    GI/Hepatic hiatal hernia,GERD  ,,Pancreatic mass   Endo/Other  negative endocrine ROS    Renal/GU negative Renal ROS     Musculoskeletal  (+) Arthritis ,    Abdominal  (+) + obese  Peds  Hematology  (+) Blood dyscrasia, anemia   Anesthesia Other Findings Day of surgery medications reviewed with the patient.  Reproductive/Obstetrics                              Anesthesia Physical Anesthesia Plan  ASA: 3  Anesthesia Plan: MAC   Post-op Pain Management: Minimal or no pain anticipated   Induction: Intravenous  PONV Risk Score and Plan: 1 and Treatment may vary due to age or medical condition, Ondansetron, TIVA and Propofol infusion  Airway Management Planned:   Additional Equipment:   Intra-op Plan:   Post-operative Plan:   Informed Consent: I have reviewed the patients History and Physical, chart, labs and discussed the procedure including the risks, benefits and alternatives for the proposed anesthesia with the patient or authorized representative who has indicated his/her understanding and acceptance.     Dental advisory given  Plan Discussed with: CRNA  Anesthesia Plan Comments:          Anesthesia Quick Evaluation

## 2022-12-23 NOTE — H&P (Signed)
GASTROENTEROLOGY PROCEDURE H&P NOTE   Primary Care Physician: [name], NP  HPI: Marcus [name] is a 82 y.o. male who presents for EGD/EUS to evaluate pancreatic mass noted on imaging s/p recent ERCP with negative brushings.  Past Medical History:  Diagnosis Date   Acute meniscal tear of knee left   Arthritis    generalized   AV block 08/01/2018   Benign essential hypertension 01/18/2007   Bronchiectasis with acute exacerbation 06/23/2020   Chest pain, atypical 08/23/2014   Chronic cough 10/07/2014   Coronary artery disease    Degeneration of lumbar intervertebral disc 10/14/2017   Diverticulosis of colon 07/07/2005   Elevated PSA 06/19/2014   GERD (gastroesophageal reflux disease) 01/18/2007   Gout, unspecified 01/18/2007   Hemorrhoids 01/19/2011   Hiatal hernia    History of colonic polyps 01/18/2007   Insomnia, unspecified 08/21/2007   Iron deficiency anemia, unspecified  01/19/2011   Lumbar post-laminectomy syndrome 10/14/2017   Lumbar spondylolysis    Lumbar strain 12/14/2011   Obstructive sleep apnea 01/18/2007   uses CPAP nightly   Paraesophageal hernia    large   Primary osteoarthritis of knee 05/23/2015   Prostate cancer (HCC) 2016   S/P knee replacement 05/23/2015   Sensorineural hearing loss, bilateral    Vitamin B12 deficiency    Vitamin D deficiency 10/28/2009   Past Surgical History:  Procedure Laterality Date   BILIARY BRUSHING  12/13/2022   Procedure: BILIARY BRUSHING;  Surgeon: Iva Boop, MD;  Location: [facility] ENDOSCOPY;  Service: Gastroenterology;;   BILIARY STENT PLACEMENT  12/13/2022   Procedure: BILIARY STENT PLACEMENT;  Surgeon: Iva Boop, MD;  Location: [facility] ENDOSCOPY;  Service: Gastroenterology;;   BRONCHIAL WASHINGS  10/30/2019   Procedure: BRONCHIAL WASHINGS;  Surgeon: [name], DO;  Location: [facility] ENDOSCOPY;  Service: Endoscopy;;   CARDIAC CATHETERIZATION     CATARACT EXTRACTION W/ INTRAOCULAR LENS  IMPLANT, BILATERAL  Bilateral    COLONOSCOPY     ERCP N/A 12/13/2022   Procedure: ENDOSCOPIC RETROGRADE CHOLANGIOPANCREATOGRAPHY (ERCP);  Surgeon: Iva Boop, MD;  Location: [facility] ENDOSCOPY;  Service: Gastroenterology;  Laterality: N/A;  with stent placement   ESOPHAGOGASTRODUODENOSCOPY     KNEE ARTHROSCOPY Right 2007   KNEE ARTHROSCOPY  07/13/2011   Procedure: ARTHROSCOPY KNEE;  Surgeon: Erasmo Leventhal;  Location: [facility];  Service: Orthopedics;  Laterality: Left;  partial menisectomy with chondrylplasty   KNEE SURGERY Left    LAMINECTOMY AND MICRODISCECTOMY LUMBAR SPINE  MARCH  2008   L3 -  4   LAPAROSCOPIC INCISIONAL / UMBILICAL / VENTRAL HERNIA REPAIR  2006   PACEMAKER IMPLANT N/A 08/02/2018   Procedure: PACEMAKER IMPLANT;  Surgeon: Marinus Maw, MD;  Location: [facility] INVASIVE CV LAB;  Service: Cardiovascular;  Laterality: N/A;   PROSTATE BIOPSY     RADIOACTIVE SEED IMPLANT N/A 02/12/2019   Procedure: RADIOACTIVE SEED IMPLANT/BRACHYTHERAPY IMPLANT;  Surgeon: Marcine Matar, MD;  Location: [facility] ORS;  Service: Urology;  Laterality: N/A;  90 MINS   SHOULDER ARTHROSCOPY Right 05-23-2007   SIGMOID COLECTOMY FOR CANCER  1989   SPACE OAR INSTILLATION N/A 02/12/2019   Procedure: SPACE OAR INSTILLATION;  Surgeon: Marcine Matar, MD;  Location: [facility] ORS;  Service: Urology;  Laterality: N/A;   SPHINCTEROTOMY  12/13/2022   Procedure: SPHINCTEROTOMY;  Surgeon: Iva Boop, MD;  Location: [facility] ENDOSCOPY;  Service: Gastroenterology;;   TOTAL KNEE ARTHROPLASTY Left 05/23/2015   Procedure: LEFT TOTAL KNEE ARTHROPLASTY;  Surgeon: Eugenia Mcalpine, MD;  Location:  [facility] ORS;  Service: Orthopedics;  Laterality: Left;   VERTICAL BANDED GASTROPLASTY  1986   VIDEO BRONCHOSCOPY N/A 10/30/2019   Procedure: VIDEO BRONCHOSCOPY WITH BRONICAL ALVEROLAR LAVAGE WITHOUT FLUORO;  Surgeon: [name], DO;  Location: [facility] ENDOSCOPY;  Service: Endoscopy;  Laterality: N/A;   Current Facility-Administered Medications   Medication Dose Route Frequency Provider Last Rate Last Admin   0.9 %  sodium chloride infusion   Intravenous Continuous Mansouraty, Netty Starring., MD       lactated ringers infusion   Intravenous Continuous Mansouraty, Netty Starring., MD 10 mL/hr at 12/23/22 1148 New Bag at 12/23/22 1148    Current Facility-Administered Medications:    0.9 %  sodium chloride infusion, , Intravenous, Continuous, Mansouraty, Netty Starring., MD   lactated ringers infusion, , Intravenous, Continuous, Mansouraty, Netty Starring., MD, Last Rate: 10 mL/hr at 12/23/22 1148, New Bag at 12/23/22 1148 Allergies  Allergen Reactions   Contrast Media [Iodinated Contrast Media] Palpitations    TACHYCARDIA- patient states he has tolerated newer agents since this reaction >30 yrs ago   Family History  Problem Relation Age of Onset   Stroke Paternal Grandfather    Heart disease Paternal Grandfather    Esophagitis Father        died from perforated esophagus   Breast cancer Mother    Colon cancer Maternal Grandmother        questionable   Esophageal cancer Neg Hx    Prostate cancer Neg Hx    Stomach cancer Neg Hx    Social History   Socioeconomic History   Marital status: Widowed    Spouse name: Not on file   Number of children: 4   Years of education: 49   Highest education level: Some college, no degree  Occupational History   Occupation: retired    Associate Professor: [employer]  Tobacco Use   Smoking status: Former    Packs/day: 2.00    Years: 20.00    Additional pack years: 0.00    Total pack years: 40.00    Types: Cigarettes    Quit date: 07/05/1974    Years since quitting: 48.5   Smokeless tobacco: Never  Vaping Use   Vaping Use: Never used  Substance and Sexual Activity   Alcohol use: Not Currently    Alcohol/week: 30.0 standard drinks of alcohol    Types: 30 Standard drinks or equivalent per week    Comment: 4-5 glasses of wine or 2-3 beers nightly   Drug use: No   Sexual activity: Not Currently  Other  Topics Concern   Not on file  Social History Narrative   Recently widowed   Former Smoker    Alcohol use-yes 1-2 drinks per day      Occupation: Retired Associate Professor      Originally from [place], [place] - in [place] > 10 yrs as of 2017      Social Determinants of Health   Financial Resource Strain: Low Risk  (07/28/2021)   Overall Financial Resource Strain (CARDIA)    Difficulty of Paying Living Expenses: Not hard at all  Food Insecurity: No Food Insecurity (12/20/2022)   Hunger Vital Sign    Worried About Running Out of Food in the Last Year: Never true    Ran Out of Food in the Last Year: Never true  Transportation Needs: No Transportation Needs (12/20/2022)   PRAPARE - Administrator, Civil Service (Medical): No    Lack of Transportation (Non-Medical): No  Physical  Activity: Inactive (07/28/2021)   Exercise Vital Sign    Days of Exercise per Week: 0 days    Minutes of Exercise per Session: 0 min  Stress: No Stress Concern Present (07/28/2021)   [employer] of Occupational Health - Occupational Stress Questionnaire    Feeling of Stress : Not at all  Social Connections: Moderately Isolated (08/04/2020)   Social Connection and Isolation Panel [NHANES]    Frequency of Communication with Friends and Family: Three times a week    Frequency of Social Gatherings with Friends and Family: More than three times a week    Attends Religious Services: More than 4 times per year    Active Member of Clubs or Organizations: No    Attends Banker Meetings: Never    Marital Status: Widowed  Intimate Partner Violence: Not At Risk (12/20/2022)   Humiliation, Afraid, Rape, and Kick questionnaire    Fear of Current or Ex-Partner: No    Emotionally Abused: No    Physically Abused: No    Sexually Abused: No    Physical Exam: Today's Vitals   12/23/22 1128  BP: (!) 137/56  Pulse: 66  Resp: 13  Temp: 98 F (36.7 C)  TempSrc: Temporal  SpO2: 98%  Weight: 96.2 kg  Height:  5\' 8"  (1.727 m)  PainSc: 0-No pain   Body mass index is 32.23 kg/m. GEN: NAD EYE: Sclerae anicteric ENT: MMM CV: Non-tachycardic GI: Soft, NT/ND NEURO:  Alert & Oriented x 3  Lab Results: Recent Labs    12/20/22 1515 12/23/22 1144  WBC 7.8 5.7  HGB 10.1* 8.6*  HCT 31.5* 26.9*  PLT 226 216   BMET No results for input(s): "NA", "K", "CL", "CO2", "GLUCOSE", "BUN", "CREATININE", "CALCIUM" in the last 72 hours. LFT No results for input(s): "PROT", "ALBUMIN", "AST", "ALT", "ALKPHOS", "BILITOT", "BILIDIR", "IBILI" in the last 72 hours. PT/INR No results for input(s): "LABPROT", "INR" in the last 72 hours.   Impression / Plan: This is a 82 y.o.male who presents for EGD/EUS to evaluate pancreatic mass noted on imaging s/p recent ERCP with negative brushings.  The risks of an EUS including intestinal perforation, bleeding, infection, aspiration, and medication effects were discussed as was the possibility it may not give a definitive diagnosis if a biopsy is performed.  When a biopsy of the pancreas is done as part of the EUS, there is an additional risk of pancreatitis at the rate of about 1-2%.  It was explained that procedure related pancreatitis is typically mild, although it can be severe and even life threatening, which is why we do not perform random pancreatic biopsies and only biopsy a lesion/area we feel is concerning enough to warrant the risk.   The risks and benefits of endoscopic evaluation/treatment were discussed with the patient and/or family; these include but are not limited to the risk of perforation, infection, bleeding, missed lesions, lack of diagnosis, severe illness requiring hospitalization, as well as anesthesia and sedation related illnesses.  The patient's history has been reviewed, patient examined, no change in status, and deemed stable for procedure.  The patient and/or family is agreeable to proceed.    Corliss Parish, MD [place]  [facility] Advanced Endoscopy Office # [phone]

## 2022-12-23 NOTE — Anesthesia Postprocedure Evaluation (Signed)
Anesthesia Post Note  Patient: Marcus [name]  Procedure(s) Performed: UPPER ENDOSCOPIC ULTRASOUND (EUS) RADIAL ESOPHAGOGASTRODUODENOSCOPY (EGD) WITH PROPOFOL BIOPSY FINE NEEDLE ASPIRATION (FNA) RADIAL     Patient location during evaluation: PACU Anesthesia Type: MAC Level of consciousness: awake and alert Pain management: pain level controlled Vital Signs Assessment: post-procedure vital signs reviewed and stable Respiratory status: spontaneous breathing Cardiovascular status: stable Anesthetic complications: no Comments: Called to endo recovery over concern pt was in V-Tach. Pt BP was stable and he was awake and alert. I requested EKG be done. When I arrived Dr. Meridee Score was at bedside as well. Pt was AAO and VSS. Multiple EKGs done as well as rhythm strip. I believe he has a multifocal atrial pacemaker and, with his high grade AV block, he is in an AS-VP rhythm. This gives the picture of intermittent VT. Pt is asymptomatic. Dr. Meridee Score and I discussed this and feel it is okay for him to DC home.   No notable events documented.  Last Vitals:  Vitals:   12/23/22 1500 12/23/22 1514  BP: 90/67 112/68  Pulse: 63 (!) 59  Resp: 14 13  Temp:    SpO2: 96% 97%    Last Pain:  Vitals:   12/23/22 1514  TempSrc:   PainSc: 0-No pain                 Lewie Loron

## 2022-12-23 NOTE — Discharge Instructions (Signed)
YOU HAD AN ENDOSCOPIC PROCEDURE TODAY: Refer to the procedure report and other information in the discharge instructions given to you for any specific questions about what was found during the examination. If this information does not answer your questions, please call [place] office at [phone] to clarify.  ° °YOU SHOULD EXPECT: Some feelings of bloating in the abdomen. Passage of more gas than usual. Walking can help get rid of the air that was put into your GI tract during the procedure and reduce the bloating. If you had a lower endoscopy (such as a colonoscopy or flexible sigmoidoscopy) you may notice spotting of blood in your stool or on the toilet paper. Some abdominal soreness may be present for a day or two, also. ° °DIET: Your first meal following the procedure should be a light meal and then it is ok to progress to your normal diet. A half-sandwich or bowl of soup is an example of a good first meal. Heavy or fried foods are harder to digest and may make you feel nauseous or bloated. Drink plenty of fluids but you should avoid alcoholic beverages for 24 hours. If you had a esophageal dilation, please see attached instructions for diet.   ° °ACTIVITY: Your care partner should take you home directly after the procedure. You should plan to take it easy, moving slowly for the rest of the day. You can resume normal activity the day after the procedure however YOU SHOULD NOT DRIVE, use power tools, machinery or perform tasks that involve climbing or major physical exertion for 24 hours (because of the sedation medicines used during the test).  ° °SYMPTOMS TO REPORT IMMEDIATELY: °A gastroenterologist can be reached at any hour. Please call [phone]  for any of the following symptoms:  °Following lower endoscopy (colonoscopy, flexible sigmoidoscopy) °Excessive amounts of blood in the stool  °Significant tenderness, worsening of abdominal pains  °Swelling of the abdomen that is new, acute  °Fever of 100° or  higher  °Following upper endoscopy (EGD, EUS, ERCP, esophageal dilation) °Vomiting of blood or coffee ground material  °New, significant abdominal pain  °New, significant chest pain or pain under the shoulder blades  °Painful or persistently difficult swallowing  °New shortness of breath  °Black, tarry-looking or red, bloody stools ° °FOLLOW UP:  °If any biopsies were taken you will be contacted by phone or by letter within the next 1-3 weeks. Call [phone]  if you have not heard about the biopsies in 3 weeks.  °Please also call with any specific questions about appointments or follow up tests. ° °

## 2022-12-24 ENCOUNTER — Encounter: Payer: Self-pay | Admitting: *Deleted

## 2022-12-24 ENCOUNTER — Inpatient Hospital Stay: Payer: Medicare Other

## 2022-12-24 DIAGNOSIS — I442 Atrioventricular block, complete: Secondary | ICD-10-CM | POA: Diagnosis not present

## 2022-12-24 DIAGNOSIS — G4730 Sleep apnea, unspecified: Secondary | ICD-10-CM | POA: Diagnosis not present

## 2022-12-24 DIAGNOSIS — J479 Bronchiectasis, uncomplicated: Secondary | ICD-10-CM | POA: Diagnosis not present

## 2022-12-24 DIAGNOSIS — Z9884 Bariatric surgery status: Secondary | ICD-10-CM | POA: Diagnosis not present

## 2022-12-24 DIAGNOSIS — C250 Malignant neoplasm of head of pancreas: Secondary | ICD-10-CM | POA: Diagnosis not present

## 2022-12-24 DIAGNOSIS — I4891 Unspecified atrial fibrillation: Secondary | ICD-10-CM | POA: Diagnosis not present

## 2022-12-24 DIAGNOSIS — D649 Anemia, unspecified: Secondary | ICD-10-CM

## 2022-12-24 DIAGNOSIS — M109 Gout, unspecified: Secondary | ICD-10-CM | POA: Diagnosis not present

## 2022-12-24 DIAGNOSIS — I2510 Atherosclerotic heart disease of native coronary artery without angina pectoris: Secondary | ICD-10-CM | POA: Diagnosis not present

## 2022-12-24 DIAGNOSIS — Z85038 Personal history of other malignant neoplasm of large intestine: Secondary | ICD-10-CM | POA: Diagnosis not present

## 2022-12-24 DIAGNOSIS — K831 Obstruction of bile duct: Secondary | ICD-10-CM | POA: Diagnosis not present

## 2022-12-24 DIAGNOSIS — I10 Essential (primary) hypertension: Secondary | ICD-10-CM | POA: Diagnosis not present

## 2022-12-24 DIAGNOSIS — K219 Gastro-esophageal reflux disease without esophagitis: Secondary | ICD-10-CM | POA: Diagnosis not present

## 2022-12-24 DIAGNOSIS — R748 Abnormal levels of other serum enzymes: Secondary | ICD-10-CM | POA: Diagnosis not present

## 2022-12-24 LAB — CBC WITH DIFFERENTIAL (CANCER CENTER ONLY)
Abs Immature Granulocytes: 0.2 10*3/uL — ABNORMAL HIGH (ref 0.00–0.07)
Basophils Absolute: 0 10*3/uL (ref 0.0–0.1)
Basophils Relative: 0 %
Eosinophils Absolute: 0 10*3/uL (ref 0.0–0.5)
Eosinophils Relative: 0 %
HCT: 25.4 % — ABNORMAL LOW (ref 39.0–52.0)
Hemoglobin: 8.5 g/dL — ABNORMAL LOW (ref 13.0–17.0)
Immature Granulocytes: 2 %
Lymphocytes Relative: 9 %
Lymphs Abs: 1 10*3/uL (ref 0.7–4.0)
MCH: 30.6 pg (ref 26.0–34.0)
MCHC: 33.5 g/dL (ref 30.0–36.0)
MCV: 91.4 fL (ref 80.0–100.0)
Monocytes Absolute: 0.5 10*3/uL (ref 0.1–1.0)
Monocytes Relative: 4 %
Neutro Abs: 9.7 10*3/uL — ABNORMAL HIGH (ref 1.7–7.7)
Neutrophils Relative %: 85 %
Platelet Count: 247 10*3/uL (ref 150–400)
RBC: 2.78 MIL/uL — ABNORMAL LOW (ref 4.22–5.81)
RDW: 15.9 % — ABNORMAL HIGH (ref 11.5–15.5)
WBC Count: 11.4 10*3/uL — ABNORMAL HIGH (ref 4.0–10.5)
nRBC: 0 % (ref 0.0–0.2)

## 2022-12-24 NOTE — Progress Notes (Signed)
Marcus [name] came to office for re-check on CBC. Hgb is stable at 8.5 today. He reports stools are still very dark, but getting more brown. No s/s of anemia. Reports urine looks normal and his itching is significantly improved-able to sleep last night. Reviewed his med list and called his attention to two new meds that GI ordered for ZOX:[id number] and Nexium and he needs to pick these up. Dr. Truett Perna still wants him to hold his Eliquis till he is seen on 12/29/22. Marcus [name] agrees.

## 2022-12-27 ENCOUNTER — Encounter (HOSPITAL_COMMUNITY): Payer: Self-pay | Admitting: Gastroenterology

## 2022-12-27 LAB — CYTOLOGY - NON PAP

## 2022-12-28 ENCOUNTER — Other Ambulatory Visit: Payer: Self-pay | Admitting: *Deleted

## 2022-12-28 DIAGNOSIS — K8689 Other specified diseases of pancreas: Secondary | ICD-10-CM

## 2022-12-28 LAB — SURGICAL PATHOLOGY

## 2022-12-29 ENCOUNTER — Other Ambulatory Visit: Payer: Self-pay

## 2022-12-29 ENCOUNTER — Inpatient Hospital Stay: Payer: Medicare Other

## 2022-12-29 ENCOUNTER — Inpatient Hospital Stay (HOSPITAL_BASED_OUTPATIENT_CLINIC_OR_DEPARTMENT_OTHER): Payer: Medicare Other | Admitting: Oncology

## 2022-12-29 ENCOUNTER — Inpatient Hospital Stay: Payer: Medicare Other | Admitting: Oncology

## 2022-12-29 ENCOUNTER — Other Ambulatory Visit: Payer: Self-pay | Admitting: *Deleted

## 2022-12-29 ENCOUNTER — Encounter: Payer: Self-pay | Admitting: Oncology

## 2022-12-29 ENCOUNTER — Inpatient Hospital Stay: Payer: Medicare Other | Admitting: Nutrition

## 2022-12-29 ENCOUNTER — Encounter: Payer: Self-pay | Admitting: *Deleted

## 2022-12-29 ENCOUNTER — Telehealth: Payer: Self-pay | Admitting: Genetic Counselor

## 2022-12-29 VITALS — BP 131/64 | HR 69 | Temp 98.1°F | Resp 18 | Ht 68.0 in | Wt 221.4 lb

## 2022-12-29 DIAGNOSIS — K8689 Other specified diseases of pancreas: Secondary | ICD-10-CM

## 2022-12-29 DIAGNOSIS — C250 Malignant neoplasm of head of pancreas: Secondary | ICD-10-CM

## 2022-12-29 DIAGNOSIS — R748 Abnormal levels of other serum enzymes: Secondary | ICD-10-CM | POA: Diagnosis not present

## 2022-12-29 DIAGNOSIS — K219 Gastro-esophageal reflux disease without esophagitis: Secondary | ICD-10-CM | POA: Diagnosis not present

## 2022-12-29 DIAGNOSIS — Z85038 Personal history of other malignant neoplasm of large intestine: Secondary | ICD-10-CM | POA: Diagnosis not present

## 2022-12-29 DIAGNOSIS — Z8507 Personal history of malignant neoplasm of pancreas: Secondary | ICD-10-CM | POA: Diagnosis not present

## 2022-12-29 DIAGNOSIS — K831 Obstruction of bile duct: Secondary | ICD-10-CM | POA: Diagnosis not present

## 2022-12-29 DIAGNOSIS — I2510 Atherosclerotic heart disease of native coronary artery without angina pectoris: Secondary | ICD-10-CM | POA: Diagnosis not present

## 2022-12-29 DIAGNOSIS — G4730 Sleep apnea, unspecified: Secondary | ICD-10-CM | POA: Diagnosis not present

## 2022-12-29 DIAGNOSIS — J479 Bronchiectasis, uncomplicated: Secondary | ICD-10-CM | POA: Diagnosis not present

## 2022-12-29 DIAGNOSIS — I4891 Unspecified atrial fibrillation: Secondary | ICD-10-CM | POA: Diagnosis not present

## 2022-12-29 DIAGNOSIS — M109 Gout, unspecified: Secondary | ICD-10-CM | POA: Diagnosis not present

## 2022-12-29 DIAGNOSIS — C259 Malignant neoplasm of pancreas, unspecified: Secondary | ICD-10-CM | POA: Insufficient documentation

## 2022-12-29 DIAGNOSIS — I10 Essential (primary) hypertension: Secondary | ICD-10-CM | POA: Diagnosis not present

## 2022-12-29 DIAGNOSIS — Z9884 Bariatric surgery status: Secondary | ICD-10-CM | POA: Diagnosis not present

## 2022-12-29 DIAGNOSIS — I442 Atrioventricular block, complete: Secondary | ICD-10-CM | POA: Diagnosis not present

## 2022-12-29 DIAGNOSIS — Z803 Family history of malignant neoplasm of breast: Secondary | ICD-10-CM | POA: Diagnosis not present

## 2022-12-29 LAB — CBC WITH DIFFERENTIAL (CANCER CENTER ONLY)
Abs Immature Granulocytes: 0.16 10*3/uL — ABNORMAL HIGH (ref 0.00–0.07)
Basophils Absolute: 0 10*3/uL (ref 0.0–0.1)
Basophils Relative: 1 %
Eosinophils Absolute: 0.2 10*3/uL (ref 0.0–0.5)
Eosinophils Relative: 4 %
HCT: 26.7 % — ABNORMAL LOW (ref 39.0–52.0)
Hemoglobin: 8.7 g/dL — ABNORMAL LOW (ref 13.0–17.0)
Immature Granulocytes: 3 %
Lymphocytes Relative: 19 %
Lymphs Abs: 1.1 10*3/uL (ref 0.7–4.0)
MCH: 30.5 pg (ref 26.0–34.0)
MCHC: 32.6 g/dL (ref 30.0–36.0)
MCV: 93.7 fL (ref 80.0–100.0)
Monocytes Absolute: 0.5 10*3/uL (ref 0.1–1.0)
Monocytes Relative: 10 %
Neutro Abs: 3.6 10*3/uL (ref 1.7–7.7)
Neutrophils Relative %: 63 %
Platelet Count: 266 10*3/uL (ref 150–400)
RBC: 2.85 MIL/uL — ABNORMAL LOW (ref 4.22–5.81)
RDW: 16.7 % — ABNORMAL HIGH (ref 11.5–15.5)
WBC Count: 5.7 10*3/uL (ref 4.0–10.5)
nRBC: 0 % (ref 0.0–0.2)

## 2022-12-29 LAB — CMP (CANCER CENTER ONLY)
ALT: 63 U/L — ABNORMAL HIGH (ref 0–44)
AST: 25 U/L (ref 15–41)
Albumin: 3.2 g/dL — ABNORMAL LOW (ref 3.5–5.0)
Alkaline Phosphatase: 234 U/L — ABNORMAL HIGH (ref 38–126)
Anion gap: 5 (ref 5–15)
BUN: 22 mg/dL (ref 8–23)
CO2: 27 mmol/L (ref 22–32)
Calcium: 8.5 mg/dL — ABNORMAL LOW (ref 8.9–10.3)
Chloride: 109 mmol/L (ref 98–111)
Creatinine: 1 mg/dL (ref 0.61–1.24)
GFR, Estimated: >60 mL/min (ref >60)
Glucose, Bld: 132 mg/dL — ABNORMAL HIGH (ref 70–99)
Potassium: 4 mmol/L (ref 3.5–5.1)
Sodium: 141 mmol/L (ref 135–145)
Total Bilirubin: 1.4 mg/dL — ABNORMAL HIGH (ref 0.3–1.2)
Total Protein: 5.6 g/dL — ABNORMAL LOW (ref 6.5–8.1)

## 2022-12-29 LAB — SAMPLE TO BLOOD BANK

## 2022-12-29 NOTE — Telephone Encounter (Signed)
[facility] Custom Panel+RNA was ordered. This panel includes CancerNext-Expanded and Pancreatitis genes for a total of 77 genes.   Lalla Brothers, MS, [facility] Genetic Counselor [email or web address] (P) [phone]

## 2022-12-29 NOTE — Progress Notes (Signed)
[facility] OFFICE PROGRESS NOTE   Diagnosis: Pancreas cancer  INTERVAL HISTORY:   Marcus [name] is as scheduled.  He is here with his daughters.  Pruritus is much improved.  His stools were black last week but have now returned to a brown color.  He discontinued anticoagulation last week. An endoscopic ultrasound by Dr. Meridee Score on 12/23/2022 revealed esophageal ulcers with no bleeding and esophagitis with no bleeding at the GE junction.  A mass was identified in the pancreas head, staged as T2 N0 by ultrasound criteria.  There is invasion of the SMV.  An FNA of the mass was performed.  No malignant appearing lymph nodes.  4 cystic lesions were noted in the liver. Objective:  Vital signs in last 24 hours:  Blood pressure 131/64, pulse 69, temperature 98.1 F (36.7 C), temperature source Oral, resp. rate 18, height 5\' 8"  (1.727 m), weight 221 lb 6.4 oz (100.4 kg), SpO2 100 %.    HEENT: Mild scleral icterus Resp: Clear bilaterally Cardio: Regular rate and rhythm GI: No hepatosplenomegaly, nontender Vascular: Trace bilateral lower leg edema   Lab Results:  Lab Results  Component Value Date   WBC 5.7 12/29/2022   HGB 8.7 (L) 12/29/2022   HCT 26.7 (L) 12/29/2022   MCV 93.7 12/29/2022   PLT 266 12/29/2022   NEUTROABS 3.6 12/29/2022    CMP  Lab Results  Component Value Date   NA 141 12/29/2022   K 4.0 12/29/2022   CL 109 12/29/2022   CO2 27 12/29/2022   GLUCOSE 132 (H) 12/29/2022   BUN 22 12/29/2022   CREATININE 1.00 12/29/2022   CALCIUM 8.5 (L) 12/29/2022   PROT 5.6 (L) 12/29/2022   ALBUMIN 3.2 (L) 12/29/2022   AST 25 12/29/2022   ALT 63 (H) 12/29/2022   ALKPHOS 234 (H) 12/29/2022   BILITOT 1.4 (H) 12/29/2022   GFRNONAA >60 12/29/2022   GFRAA >60 02/07/2019    Lab Results  Component Value Date   CEA1 6.4 (H) 12/11/2022   CEA 1.1 03/03/2010   ZOX096 1,667 (H) 12/11/2022    Medications: I have reviewed the patient's current  medications.   Assessment/Plan: Pancreas cancer CT abdomen/pelvis 12/03/2022-suspected pancreas mass in the proximal body measuring 2 cm with probable mass effect on the superior aspect of the SMV, multiple hypodense liver lesions, many are new from a remote CT-indeterminate, 2 cystic lesions in the pancreas tail-nonspecific prior bariatric surgery ERCP 12/13/2022-malignant appearing common bile duct stricture, plastic stent placed, brushing cytology-no malignant cells EUS 12/23/2022-30 x 33 mm pancreas head mass with invasion of the SMV, no malignant appearing lymph nodes, 4 cysts in the visualized liver, FNA-adenocarcinoma Biliary obstruction secondary to 1-status post ERCP/stent placement 12/13/2022 Coronary artery disease Atrial fibrillation Gout Sleep apnea Status post bariatric surgery Hypertension Bronchiectasis Gastroesophageal reflux disease History of colon cancer in his 40s Complete heart block-pacemaker placed     Disposition: Marcus Marasigan has been diagnosed with pancreas cancer.  He has clinical stage Ib disease unless the indeterminate liver lesions represent metastases.  The tumor appears unresectable based on the SMV invasion, his age, and comorbid conditions.  I presented his case at the GI tumor conference today and the surgeons present agreed that the tumor is unresectable.  I discussed treatment options with Marcus. Dymek and his family including comfort care versus a trial of systemic chemotherapy.  He understands no therapy would be curative.  The goal of chemotherapy is to palliate symptoms and potentially extend survival.  He would  like to proceed with a trial of systemic chemotherapy.  I recommend gemcitabine/Abraxane.  We reviewed potential toxicities associated with the gemcitabine/Abraxane regimen including the chance of nausea, alopecia, hematologic toxicity, infection, and bleeding.  We discussed the rash, fever, and pneumonitis associated with gemcitabine.  We  discussed the allergic reaction and neuropathy seen with Abraxane.  He agrees to proceed.  He will attend a chemotherapy teaching class.  He will be referred for Port-A-Cath placement with the plan to begin gemcitabine/Abraxane on 01/07/2023.  We will check a baseline CA 19-9 when he is here on 01/07/2023.  The plan is to plan for a restaging CT after 5-6 cycles of gemcitabine/Abraxane.  We will submit the EUS biopsy sample for molecular testing.  Marcus Musial be referred for genetic counseling.  A chemotherapy plan was entered today.  Thornton Papas, MD  12/29/2022  11:51 AM

## 2022-12-29 NOTE — Progress Notes (Signed)
START ON PATHWAY REGIMEN - Pancreatic Adenocarcinoma     A cycle is every 28 days:     Nab-paclitaxel (protein bound)      Gemcitabine   **Always confirm dose/schedule in your pharmacy ordering system**  Patient Characteristics: Locally Advanced, Anatomically Unresectable, M0, First Line, PS = 0,1, BRCA1/2 and PALB2 Mutation Absent/Unknown, Chemotherapy Therapeutic Status: Locally Advanced, Anatomically Unresectable, M0 Line of Therapy: First Line ECOG Performance Status: 1 BRCA1/2 Mutation Status: Awaiting Test Results PALB2 Mutation Status: Awaiting Test Results Intent of Therapy: Non-Curative / Palliative Intent, Discussed with Patient 

## 2022-12-29 NOTE — Progress Notes (Signed)
PATIENT NAVIGATOR PROGRESS NOTE  Name: Marcus [name] Date: 12/29/2022 MRN: [id number]  DOB: Apr 11, 1941   Reason for visit:  F/u visit and pre treatment discussion  Comments:  Met with [name] and his daughters  Scheduled PAC placement for 01/10/23 at [facility] with arrival time of 1130 Scheduled for Patient education on Friday at 2 pm and tx to begin on 7/12    Time spent counseling/coordinating care: 45-60 minutes

## 2022-12-29 NOTE — Progress Notes (Signed)
The proposed treatment discussed in conference is for discussion purpose only and is not a binding recommendation.  The patients have not been physically examined, or presented with their treatment options.  Therefore, final treatment plans cannot be decided.  

## 2022-12-30 ENCOUNTER — Telehealth: Payer: Self-pay | Admitting: Internal Medicine

## 2022-12-30 ENCOUNTER — Ambulatory Visit (INDEPENDENT_AMBULATORY_CARE_PROVIDER_SITE_OTHER): Payer: Medicare Other | Admitting: Adult Health

## 2022-12-30 ENCOUNTER — Other Ambulatory Visit: Payer: Self-pay

## 2022-12-30 ENCOUNTER — Telehealth: Payer: Self-pay

## 2022-12-30 ENCOUNTER — Encounter: Payer: Self-pay | Admitting: Adult Health

## 2022-12-30 VITALS — BP 130/60 | HR 73 | Temp 98.2°F | Ht 68.0 in | Wt 221.0 lb

## 2022-12-30 DIAGNOSIS — C250 Malignant neoplasm of head of pancreas: Secondary | ICD-10-CM | POA: Diagnosis not present

## 2022-12-30 DIAGNOSIS — K8689 Other specified diseases of pancreas: Secondary | ICD-10-CM

## 2022-12-30 NOTE — Telephone Encounter (Signed)
-----   Message from Iva Boop, MD sent at 12/30/2022  2:37 PM EDT ----- Regarding: RE: stent change He is on for September but if you are able in August I think that is better ----- Message ----- From: Lemar Lofty., MD Sent: 12/30/2022  10:05 AM EDT To: Iva Boop, MD; [name], MD Subject: RE: stent change                               CEG, I can get him in during August.  If you are good then let me and Avigdor Dollar know so we hopefully won't have any issues. GM ----- Message ----- From: Iva Boop, MD Sent: 12/30/2022   9:04 AM EDT To: [name], MD; # Subject: stent change                                   My next possible appt to do a stent change in him is 9/3  I will put him on and hope the plastic stent lasts that long. It can.  Baldo Ash

## 2022-12-30 NOTE — Telephone Encounter (Signed)
The pt has been moved to 8/5 at 115 pm with Dr Meridee Score. I have mailed all the new instructions to the pt   Left message on machine to call back

## 2022-12-30 NOTE — Telephone Encounter (Signed)
Left message for patient to call back  

## 2022-12-30 NOTE — Telephone Encounter (Signed)
Spoke with patient regarding MD recommendations & scheduled him for ERCP w/stent change on 03/08/23 at 8:15 am at [facility] with Dr. Leone Payor. Amb ref & orders placed. Instructions discussed with patient & mailed home. He will be starting chemo soon & asked that I make Dr. Leone Payor aware. Not currently on a blood thinner, but advised to call back if this changes prior to procedure date. Pt verbalized all understanding.

## 2022-12-30 NOTE — Telephone Encounter (Signed)
Patient needs to be set up for an ERCP and stent change on September 3 Emelle blocked that I have.  I believe he has stopped his Eliquis but we need to clarify that and let me know if that is not the case.  If he is going to go back on it we will need to have him hold it 2 days before.   He is probably not thinking about this call coming but explained that the stent I put in in June tends not to last longer than 3 months and so I should replace it with a longer lasting stent on September 3.  Please let me know that this is arranged.  As part of the orders process he needs IV Cipro and he also needs a diclofenac suppository administered for the procedure.

## 2022-12-30 NOTE — Progress Notes (Signed)
Pharmacist Chemotherapy Monitoring - Initial Assessment    Anticipated start date: 01/07/23   The following has been reviewed per standard work regarding the patient's treatment regimen: The patient's diagnosis, treatment plan and drug doses, and organ/hematologic function Lab orders and baseline tests specific to treatment regimen  The treatment plan start date, drug sequencing, and pre-medications Prior authorization status  Patient's documented medication list, including drug-drug interaction screen and prescriptions for anti-emetics and supportive care specific to the treatment regimen The drug concentrations, fluid compatibility, administration routes, and timing of the medications to be used The patient's access for treatment and lifetime cumulative dose history, if applicable  The patient's medication allergies and previous infusion related reactions, if applicable   Changes made to treatment plan:  N/A  Follow up needed:  N/A   Marcus [name], RPH, 12/30/2022  2:11 PM

## 2022-12-30 NOTE — Telephone Encounter (Signed)
ERCP scheduled, pt instructed and medications reviewed.  Patient instructions mailed to home.  Patient to call with any questions or concerns.  

## 2022-12-30 NOTE — Progress Notes (Signed)
Subjective:    Patient ID: Marcus [name], male    DOB: June 22, 1941, 82 y.o.   MRN: [id number]  HPI  82 year old male who  has a past medical history of Acute meniscal tear of knee (left), Arthritis, AV block (08/01/2018), Benign essential hypertension (01/18/2007), Bronchiectasis with acute exacerbation (06/23/2020), Chest pain, atypical (08/23/2014), Chronic cough (10/07/2014), Coronary artery disease, Degeneration of lumbar intervertebral disc (10/14/2017), Diverticulosis of colon (07/07/2005), Elevated PSA (06/19/2014), GERD (gastroesophageal reflux disease) (01/18/2007), Gout, unspecified (01/18/2007), Hemorrhoids (01/19/2011), Hiatal hernia, History of colonic polyps (01/18/2007), Insomnia, unspecified (08/21/2007), Iron deficiency anemia, unspecified  (01/19/2011), Lumbar post-laminectomy syndrome (10/14/2017), Lumbar spondylolysis, Lumbar strain (12/14/2011), Obstructive sleep apnea (01/18/2007), Paraesophageal hernia, Primary osteoarthritis of knee (05/23/2015), Prostate cancer (HCC) (2016), S/P knee replacement (05/23/2015), Sensorineural hearing loss, bilateral, Vitamin B12 deficiency, and Vitamin D deficiency (10/28/2009).  He presents to the office today for follow-up.  Recently diagnosed with pancreatic cancer.  He was seen by oncology yesterday, this mass at the head was staged at T2 N0 by ultrasound criteria.  There is invasion of the SMV.  He did have FNA of the mass.  No malignant appearing lymph nodes were noted.  Was having quite a bit of pruritus due to biliary obstruction but this is much improved.  Last week he had some black stools but this has returned to brown in color.  He was discontinued on anticoagulation last week.  Oncology informed him that his tumor appeared to be unresectable based on SMV invasion, his age, and comorbid conditions.  He has decided to proceed with systemic chemotherapy for palliative symptoms and potentially extend survival.  Review of Systems See  HPI   Past Medical History:  Diagnosis Date   Acute meniscal tear of knee left   Arthritis    generalized   AV block 08/01/2018   Benign essential hypertension 01/18/2007   Bronchiectasis with acute exacerbation 06/23/2020   Chest pain, atypical 08/23/2014   Chronic cough 10/07/2014   Coronary artery disease    Degeneration of lumbar intervertebral disc 10/14/2017   Diverticulosis of colon 07/07/2005   Elevated PSA 06/19/2014   GERD (gastroesophageal reflux disease) 01/18/2007   Gout, unspecified 01/18/2007   Hemorrhoids 01/19/2011   Hiatal hernia    History of colonic polyps 01/18/2007   Insomnia, unspecified 08/21/2007   Iron deficiency anemia, unspecified  01/19/2011   Lumbar post-laminectomy syndrome 10/14/2017   Lumbar spondylolysis    Lumbar strain 12/14/2011   Obstructive sleep apnea 01/18/2007   uses CPAP nightly   Paraesophageal hernia    large   Primary osteoarthritis of knee 05/23/2015   Prostate cancer (HCC) 2016   S/P knee replacement 05/23/2015   Sensorineural hearing loss, bilateral    Vitamin B12 deficiency    Vitamin D deficiency 10/28/2009    Social History   Socioeconomic History   Marital status: Widowed    Spouse name: Not on file   Number of children: 4   Years of education: 66   Highest education level: Some college, no degree  Occupational History   Occupation: retired    Associate Professor: [employer]  Tobacco Use   Smoking status: Former    Packs/day: 2.00    Years: 20.00    Additional pack years: 0.00    Total pack years: 40.00    Types: Cigarettes    Quit date: 07/05/1974    Years since quitting: 48.5   Smokeless tobacco: Never  Vaping Use   Vaping Use: Never used  Substance and Sexual Activity   Alcohol use: Not Currently    Alcohol/week: 30.0 standard drinks of alcohol    Types: 30 Standard drinks or equivalent per week    Comment: 4-5 glasses of wine or 2-3 beers nightly   Drug use: No   Sexual activity: Not Currently  Other  Topics Concern   Not on file  Social History Narrative   Recently widowed   Former Smoker    Alcohol use-yes 1-2 drinks per day      Occupation: Retired Associate Professor      Originally from [place], [place] - in [place] > 10 yrs as of 2017      Social Determinants of Health   Financial Resource Strain: Low Risk  (07/28/2021)   Overall Financial Resource Strain (CARDIA)    Difficulty of Paying Living Expenses: Not hard at all  Food Insecurity: No Food Insecurity (12/20/2022)   Hunger Vital Sign    Worried About Running Out of Food in the Last Year: Never true    Ran Out of Food in the Last Year: Never true  Transportation Needs: No Transportation Needs (12/20/2022)   PRAPARE - Administrator, Civil Service (Medical): No    Lack of Transportation (Non-Medical): No  Physical Activity: Inactive (07/28/2021)   Exercise Vital Sign    Days of Exercise per Week: 0 days    Minutes of Exercise per Session: 0 min  Stress: No Stress Concern Present (07/28/2021)   [employer] of Occupational Health - Occupational Stress Questionnaire    Feeling of Stress : Not at all  Social Connections: Moderately Isolated (08/04/2020)   Social Connection and Isolation Panel [NHANES]    Frequency of Communication with Friends and Family: Three times a week    Frequency of Social Gatherings with Friends and Family: More than three times a week    Attends Religious Services: More than 4 times per year    Active Member of Clubs or Organizations: No    Attends Banker Meetings: Never    Marital Status: Widowed  Intimate Partner Violence: Not At Risk (12/20/2022)   Humiliation, Afraid, Rape, and Kick questionnaire    Fear of Current or Ex-Partner: No    Emotionally Abused: No    Physically Abused: No    Sexually Abused: No    Past Surgical History:  Procedure Laterality Date   BILIARY BRUSHING  12/13/2022   Procedure: BILIARY BRUSHING;  Surgeon: Iva Boop, MD;  Location: [facility] ENDOSCOPY;   Service: Gastroenterology;;   BILIARY STENT PLACEMENT  12/13/2022   Procedure: BILIARY STENT PLACEMENT;  Surgeon: Iva Boop, MD;  Location[facility] ENDOSCOPY;  Service: Gastroenterology;;   BIOPSY  12/23/2022   Procedure: BIOPSY;  Surgeon: Lemar Lofty., MD;  Location: [facility] ENDOSCOPY;  Service: Gastroenterology;;   BRONCHIAL WASHINGS  10/30/2019   Procedure: BRONCHIAL WASHINGS;  Surgeon: [name], DO;  Location: [facility] ENDOSCOPY;  Service: Endoscopy;;   CARDIAC CATHETERIZATION     CATARACT EXTRACTION W/ INTRAOCULAR LENS  IMPLANT, BILATERAL Bilateral    COLONOSCOPY     ERCP N/A 12/13/2022   Procedure: ENDOSCOPIC RETROGRADE CHOLANGIOPANCREATOGRAPHY (ERCP);  Surgeon: Iva Boop, MD;  Location: [facility] ENDOSCOPY;  Service: Gastroenterology;  Laterality: N/A;  with stent placement   ESOPHAGOGASTRODUODENOSCOPY     ESOPHAGOGASTRODUODENOSCOPY (EGD) WITH PROPOFOL N/A 12/23/2022   Procedure: ESOPHAGOGASTRODUODENOSCOPY (EGD) WITH PROPOFOL;  Surgeon: Meridee Score Netty Starring., MD;  Location: [facility] ENDOSCOPY;  Service: Gastroenterology;  Laterality: N/A;   EUS  N/A 12/23/2022   Procedure: UPPER ENDOSCOPIC ULTRASOUND (EUS) RADIAL;  Surgeon: Lemar Lofty., MD;  Location: [facility] ENDOSCOPY;  Service: Gastroenterology;  Laterality: N/A;   FINE NEEDLE ASPIRATION  12/23/2022   Procedure: FINE NEEDLE ASPIRATION (FNA) RADIAL;  Surgeon: Lemar Lofty., MD;  Location: [facility] ENDOSCOPY;  Service: Gastroenterology;;   KNEE ARTHROSCOPY Right 2007   KNEE ARTHROSCOPY  07/13/2011   Procedure: ARTHROSCOPY KNEE;  Surgeon: Erasmo Leventhal;  Location: [facility];  Service: Orthopedics;  Laterality: Left;  partial menisectomy with chondrylplasty   KNEE SURGERY Left    LAMINECTOMY AND MICRODISCECTOMY LUMBAR SPINE  MARCH  2008   L3 -  4   LAPAROSCOPIC INCISIONAL / UMBILICAL / VENTRAL HERNIA REPAIR  2006   PACEMAKER IMPLANT N/A 08/02/2018   Procedure: PACEMAKER IMPLANT;  Surgeon: Marinus Maw,  MD;  Location: [facility] INVASIVE CV LAB;  Service: Cardiovascular;  Laterality: N/A;   PROSTATE BIOPSY     RADIOACTIVE SEED IMPLANT N/A 02/12/2019   Procedure: RADIOACTIVE SEED IMPLANT/BRACHYTHERAPY IMPLANT;  Surgeon: Marcine Matar, MD;  Location: [facility] ORS;  Service: Urology;  Laterality: N/A;  90 MINS   SHOULDER ARTHROSCOPY Right 05-23-2007   SIGMOID COLECTOMY FOR CANCER  1989   SPACE OAR INSTILLATION N/A 02/12/2019   Procedure: SPACE OAR INSTILLATION;  Surgeon: Marcine Matar, MD;  Location: [facility] ORS;  Service: Urology;  Laterality: N/A;   SPHINCTEROTOMY  12/13/2022   Procedure: SPHINCTEROTOMY;  Surgeon: Iva Boop, MD;  Location: [facility] ENDOSCOPY;  Service: Gastroenterology;;   TOTAL KNEE ARTHROPLASTY Left 05/23/2015   Procedure: LEFT TOTAL KNEE ARTHROPLASTY;  Surgeon: Eugenia Mcalpine, MD;  Location: [facility] ORS;  Service: Orthopedics;  Laterality: Left;   VERTICAL BANDED GASTROPLASTY  1986   VIDEO BRONCHOSCOPY N/A 10/30/2019   Procedure: VIDEO BRONCHOSCOPY WITH BRONICAL ALVEROLAR LAVAGE WITHOUT FLUORO;  Surgeon: [name], DO;  Location: [facility] ENDOSCOPY;  Service: Endoscopy;  Laterality: N/A;    Family History  Problem Relation Age of Onset   Stroke Paternal Grandfather    Heart disease Paternal Grandfather    Esophagitis Father        died from perforated esophagus   Breast cancer Mother    Colon cancer Maternal Grandmother        questionable   Esophageal cancer Neg Hx    Prostate cancer Neg Hx    Stomach cancer Neg Hx     Allergies  Allergen Reactions   Contrast Media [Iodinated Contrast Media] Palpitations    TACHYCARDIA- patient states he has tolerated newer agents since this reaction >30 yrs ago    Current Outpatient Medications on File Prior to Visit  Medication Sig Dispense Refill   allopurinol (ZYLOPRIM) 300 MG tablet Take 1 tablet (300 mg total) by mouth daily. 90 tablet 3   amLODipine (NORVASC) 10 MG tablet Take 1 tablet (10 mg total) by mouth daily. 90 tablet 3    apixaban (ELIQUIS) 5 MG TABS tablet Take 1 tablet (5 mg total) by mouth 2 (two) times daily. 60 tablet 0   Calcium Carb-Cholecalciferol (CALCIUM 600 + D PO) Take 1 tablet by mouth daily. 800 units of vitamin D     camphor-menthol (SARNA) lotion Apply topically as needed for itching. 222 mL 0   esomeprazole (NEXIUM) 40 MG capsule Take 1 capsule (40 mg total) by mouth 2 (two) times daily before a meal. 60 capsule 12   hydrOXYzine (ATARAX) 50 MG tablet Take 1 tablet (50 mg total) by mouth 3 (three) times daily as  needed. 270 tablet 1   Multiple Vitamin (MULTIVITAMIN) tablet Take 1 tablet by mouth in the morning.     polyethylene glycol powder (GLYCOLAX/MIRALAX) 17 GM/SCOOP powder Take 17g (1 capful) by mouth as directed daily as needed for mild constipation. 238 g 0   Respiratory Therapy Supplies (FLUTTER) DEVI 1 each by Does not apply route in the morning and at bedtime. 1 each 0   sodium chloride HYPERTONIC 3 % nebulizer solution Take by nebulization as needed for other. 750 mL 12   sucralfate (CARAFATE) 1 GM/10ML suspension Take 10 mLs (1 g total) by mouth 2 (two) times daily. 420 mL 2   tamsulosin (FLOMAX) 0.4 MG CAPS capsule Take 0.4 mg by mouth daily.     No current facility-administered medications on file prior to visit.    BP 130/60   Pulse 73   Temp 98.2 F (36.8 C) (Oral)   Ht 5\' 8"  (1.727 m)   Wt 221 lb (100.2 kg)   SpO2 98%   BMI 33.60 kg/m       Objective:   Physical Exam Vitals and nursing note reviewed.  Constitutional:      Appearance: Normal appearance.  Cardiovascular:     Rate and Rhythm: Normal rate and regular rhythm.     Pulses: Normal pulses.     Heart sounds: Normal heart sounds.  Pulmonary:     Effort: Pulmonary effort is normal.     Breath sounds: Normal breath sounds.  Musculoskeletal:        General: Normal range of motion.  Skin:    General: Skin is warm and dry.  Neurological:     General: No focal deficit present.     Mental Status: He is  alert and oriented to person, place, and time.  Psychiatric:        Mood and Affect: Mood normal.        Behavior: Behavior normal.        Thought Content: Thought content normal.        Judgment: Judgment normal.       Assessment & Plan:   1. Malignant neoplasm of head of pancreas (HCC) - He seems to be at peace with his diagnosis and reports that he has " a lot of get done before he dies" - He will proceed with chemotherapy next week  - He knows he can reach out to me if he needs anything   [name], NP

## 2022-12-31 ENCOUNTER — Inpatient Hospital Stay: Payer: Medicare Other

## 2022-12-31 ENCOUNTER — Telehealth: Payer: Self-pay | Admitting: Oncology

## 2022-12-31 DIAGNOSIS — G4700 Insomnia, unspecified: Secondary | ICD-10-CM | POA: Diagnosis not present

## 2022-12-31 DIAGNOSIS — J479 Bronchiectasis, uncomplicated: Secondary | ICD-10-CM | POA: Diagnosis not present

## 2022-12-31 DIAGNOSIS — K649 Unspecified hemorrhoids: Secondary | ICD-10-CM | POA: Diagnosis not present

## 2022-12-31 MED ORDER — PROCHLORPERAZINE MALEATE 10 MG PO TABS: 10.0000 mg | ORAL_TABLET | Freq: Four times a day (QID) | ORAL | 0 refills | Status: DC | PRN

## 2022-12-31 MED ORDER — LIDOCAINE-PRILOCAINE 2.5-2.5 % EX CREA: 1.0000 | TOPICAL_CREAM | CUTANEOUS | 0 refills | Status: DC | PRN

## 2022-12-31 MED ORDER — ONDANSETRON HCL 8 MG PO TABS: 8.0000 mg | ORAL_TABLET | Freq: Three times a day (TID) | ORAL | 0 refills | Status: DC | PRN

## 2022-12-31 NOTE — Telephone Encounter (Signed)
Scheduled per 06/26 los, patient has been called and notified.  

## 2023-01-02 ENCOUNTER — Other Ambulatory Visit: Payer: Self-pay | Admitting: Oncology

## 2023-01-03 ENCOUNTER — Other Ambulatory Visit: Payer: Self-pay | Admitting: Radiology

## 2023-01-03 ENCOUNTER — Encounter: Payer: Self-pay | Admitting: Oncology

## 2023-01-03 ENCOUNTER — Other Ambulatory Visit: Payer: No Typology Code available for payment source

## 2023-01-03 NOTE — Consult Note (Signed)
Chief Complaint: Patient was seen in consultation today for port a cath placement  Referring Physician(s): Sherrill,Gary B  Supervising Physician: Simonne Come  Patient Status: [facility] - Out-pt  History of Present Illness: Marcus [name] is an 82 y.o. male, ex smoker,  with PMH sig for CAD, afib, gout, sleep apnea on CPAP, OA, prior bariatric surgery, prostate cancer, HTN, bronchiectasis, GERD, colon cancer in his 40's, complete heart block with pacemaker who presents now with newly diagnosed pancreatic cancer. He is scheduled today for port a cath placement for palliative chemotherapy.   Past Medical History:  Diagnosis Date   Acute meniscal tear of knee left   Arthritis    generalized   AV block 08/01/2018   Benign essential hypertension 01/18/2007   Bronchiectasis with acute exacerbation 06/23/2020   Chest pain, atypical 08/23/2014   Chronic cough 10/07/2014   Coronary artery disease    Degeneration of lumbar intervertebral disc 10/14/2017   Diverticulosis of colon 07/07/2005   Elevated PSA 06/19/2014   GERD (gastroesophageal reflux disease) 01/18/2007   Gout, unspecified 01/18/2007   Hemorrhoids 01/19/2011   Hiatal hernia    History of colonic polyps 01/18/2007   Insomnia, unspecified 08/21/2007   Iron deficiency anemia, unspecified  01/19/2011   Lumbar post-laminectomy syndrome 10/14/2017   Lumbar spondylolysis    Lumbar strain 12/14/2011   Obstructive sleep apnea 01/18/2007   uses CPAP nightly   Paraesophageal hernia    large   Primary osteoarthritis of knee 05/23/2015   Prostate cancer (HCC) 2016   S/P knee replacement 05/23/2015   Sensorineural hearing loss, bilateral    Vitamin B12 deficiency    Vitamin D deficiency 10/28/2009    Past Surgical History:  Procedure Laterality Date   BILIARY BRUSHING  12/13/2022   Procedure: BILIARY BRUSHING;  Surgeon: Iva Boop, MD;  Location: [facility] ENDOSCOPY;  Service: Gastroenterology;;   BILIARY STENT PLACEMENT   12/13/2022   Procedure: BILIARY STENT PLACEMENT;  Surgeon: Iva Boop, MD;  Location: [facility] ENDOSCOPY;  Service: Gastroenterology;;   BIOPSY  12/23/2022   Procedure: BIOPSY;  Surgeon: Lemar Lofty., MD;  Location: [facility] ENDOSCOPY;  Service: Gastroenterology;;   BRONCHIAL WASHINGS  10/30/2019   Procedure: BRONCHIAL WASHINGS;  Surgeon: [name], DO;  Location: [facility] ENDOSCOPY;  Service: Endoscopy;;   CARDIAC CATHETERIZATION     CATARACT EXTRACTION W/ INTRAOCULAR LENS  IMPLANT, BILATERAL Bilateral    COLONOSCOPY     ERCP N/A 12/13/2022   Procedure: ENDOSCOPIC RETROGRADE CHOLANGIOPANCREATOGRAPHY (ERCP);  Surgeon: Iva Boop, MD;  Location: [facility] ENDOSCOPY;  Service: Gastroenterology;  Laterality: N/A;  with stent placement   ESOPHAGOGASTRODUODENOSCOPY     ESOPHAGOGASTRODUODENOSCOPY (EGD) WITH PROPOFOL N/A 12/23/2022   Procedure: ESOPHAGOGASTRODUODENOSCOPY (EGD) WITH PROPOFOL;  Surgeon: Meridee Score Netty Starring., MD;  Location: [facility] ENDOSCOPY;  Service: Gastroenterology;  Laterality: N/A;   EUS N/A 12/23/2022   Procedure: UPPER ENDOSCOPIC ULTRASOUND (EUS) RADIAL;  Surgeon: Lemar Lofty., MD;  Location: [facility] ENDOSCOPY;  Service: Gastroenterology;  Laterality: N/A;   FINE NEEDLE ASPIRATION  12/23/2022   Procedure: FINE NEEDLE ASPIRATION (FNA) RADIAL;  Surgeon: Lemar Lofty., MD;  Location: [facility] ENDOSCOPY;  Service: Gastroenterology;;   KNEE ARTHROSCOPY Right 2007   KNEE ARTHROSCOPY  07/13/2011   Procedure: ARTHROSCOPY KNEE;  Surgeon: Erasmo Leventhal;  Location: [facility];  Service: Orthopedics;  Laterality: Left;  partial menisectomy with chondrylplasty   KNEE SURGERY Left    LAMINECTOMY AND MICRODISCECTOMY LUMBAR SPINE  [facility]  2008  L3 -  4   LAPAROSCOPIC INCISIONAL / UMBILICAL / VENTRAL HERNIA REPAIR  2006   PACEMAKER IMPLANT N/A 08/02/2018   Procedure: PACEMAKER IMPLANT;  Surgeon: Marinus Maw, MD;  Location: [facility] INVASIVE CV LAB;  Service:  Cardiovascular;  Laterality: N/A;   PROSTATE BIOPSY     RADIOACTIVE SEED IMPLANT N/A 02/12/2019   Procedure: RADIOACTIVE SEED IMPLANT/BRACHYTHERAPY IMPLANT;  Surgeon: Marcine Matar, MD;  Location: [facility] ORS;  Service: Urology;  Laterality: N/A;  90 MINS   SHOULDER ARTHROSCOPY Right 05-23-2007   SIGMOID COLECTOMY FOR CANCER  1989   SPACE OAR INSTILLATION N/A 02/12/2019   Procedure: SPACE OAR INSTILLATION;  Surgeon: Marcine Matar, MD;  Location: [facility] ORS;  Service: Urology;  Laterality: N/A;   SPHINCTEROTOMY  12/13/2022   Procedure: SPHINCTEROTOMY;  Surgeon: Iva Boop, MD;  Location: [facility] ENDOSCOPY;  Service: Gastroenterology;;   TOTAL KNEE ARTHROPLASTY Left 05/23/2015   Procedure: LEFT TOTAL KNEE ARTHROPLASTY;  Surgeon: Eugenia Mcalpine, MD;  Location: [facility] ORS;  Service: Orthopedics;  Laterality: Left;   VERTICAL BANDED GASTROPLASTY  1986   VIDEO BRONCHOSCOPY N/A 10/30/2019   Procedure: VIDEO BRONCHOSCOPY WITH BRONICAL ALVEROLAR LAVAGE WITHOUT FLUORO;  Surgeon: [name], DO;  Location: [facility] ENDOSCOPY;  Service: Endoscopy;  Laterality: N/A;    Allergies: Contrast media [iodinated contrast media]  Medications: Prior to Admission medications   Medication Sig Start Date End Date Taking? Authorizing Provider  allopurinol (ZYLOPRIM) 300 MG tablet Take 1 tablet (300 mg total) by mouth daily. 05/25/22   Nafziger, Kandee Keen, NP  amLODipine (NORVASC) 10 MG tablet Take 1 tablet (10 mg total) by mouth daily. 05/25/22   Nafziger, Kandee Keen, NP  apixaban (ELIQUIS) 5 MG TABS tablet Take 1 tablet (5 mg total) by mouth 2 (two) times daily. 12/25/22   Mansouraty, Netty Starring., MD  Calcium Carb-Cholecalciferol (CALCIUM 600 + D PO) Take 1 tablet by mouth daily. 800 units of vitamin D    [provider]  camphor-menthol (SARNA) lotion Apply topically as needed for itching. 12/15/22   Rolly Salter, MD  esomeprazole (NEXIUM) 40 MG capsule Take 1 capsule (40 mg total) by mouth 2 (two) times daily before a  meal. 12/23/22 12/23/23  Mansouraty, Netty Starring., MD  hydrOXYzine (ATARAX) 50 MG tablet Take 1 tablet (50 mg total) by mouth 3 (three) times daily as needed. 12/23/22 03/23/23  Nafziger, Kandee Keen, NP  lidocaine-prilocaine (EMLA) cream Apply 1 Application topically as needed. 12/31/22   [name], MD  Multiple Vitamin (MULTIVITAMIN) tablet Take 1 tablet by mouth in the morning.    [provider]  ondansetron (ZOFRAN) 8 MG tablet Take 1 tablet (8 mg total) by mouth every 8 (eight) hours as needed. 12/31/22   [name], MD  polyethylene glycol powder (GLYCOLAX/MIRALAX) 17 GM/SCOOP powder Take 17g (1 capful) by mouth as directed daily as needed for mild constipation. 12/15/22   Rolly Salter, MD  prochlorperazine (COMPAZINE) 10 MG tablet Take 1 tablet (10 mg total) by mouth every 6 (six) hours as needed for nausea or vomiting. 12/31/22   [name], MD  Respiratory Therapy Supplies (FLUTTER) DEVI 1 each by Does not apply route in the morning and at bedtime. 10/11/19   Karie Fetch P, DO  sodium chloride HYPERTONIC 3 % nebulizer solution Take by nebulization as needed for other. 04/01/22   [name], NP  sucralfate (CARAFATE) 1 GM/10ML suspension Take 10 mLs (1 g total) by mouth 2 (two) times daily. 12/23/22 12/23/23  Mansouraty, Netty Starring., MD  tamsulosin (FLOMAX) 0.4 MG CAPS capsule Take 0.4 mg by mouth daily. 03/04/16   [provider]     Family History  Problem Relation Age of Onset   Stroke Paternal Grandfather    Heart disease Paternal Grandfather    Esophagitis Father        died from perforated esophagus   Breast cancer Mother    Colon cancer Maternal Grandmother        questionable   Esophageal cancer Neg Hx    Prostate cancer Neg Hx    Stomach cancer Neg Hx     Social History   Socioeconomic History   Marital status: Widowed    Spouse name: Not on file   Number of children: 4   Years of education: 6   Highest education level: Some college, no  degree  Occupational History   Occupation: retired    Associate Professor: [employer]  Tobacco Use   Smoking status: Former    Packs/day: 2.00    Years: 20.00    Additional pack years: 0.00    Total pack years: 40.00    Types: Cigarettes    Quit date: 07/05/1974    Years since quitting: 48.5   Smokeless tobacco: Never  Vaping Use   Vaping Use: Never used  Substance and Sexual Activity   Alcohol use: Not Currently    Alcohol/week: 30.0 standard drinks of alcohol    Types: 30 Standard drinks or equivalent per week    Comment: 4-5 glasses of wine or 2-3 beers nightly   Drug use: No   Sexual activity: Not Currently  Other Topics Concern   Not on file  Social History Narrative   Recently widowed   Former Smoker    Alcohol use-yes 1-2 drinks per day      Occupation: Retired Associate Professor      Originally from [place], [place] - in [place] > 10 yrs as of 2017      Social Determinants of Health   Financial Resource Strain: Low Risk  (07/28/2021)   Overall Financial Resource Strain (CARDIA)    Difficulty of Paying Living Expenses: Not hard at all  Food Insecurity: No Food Insecurity (12/20/2022)   Hunger Vital Sign    Worried About Running Out of Food in the Last Year: Never true    Ran Out of Food in the Last Year: Never true  Transportation Needs: No Transportation Needs (12/20/2022)   PRAPARE - Administrator, Civil Service (Medical): No    Lack of Transportation (Non-Medical): No  Physical Activity: Inactive (07/28/2021)   Exercise Vital Sign    Days of Exercise per Week: 0 days    Minutes of Exercise per Session: 0 min  Stress: No Stress Concern Present (07/28/2021)   [employer] of Occupational Health - Occupational Stress Questionnaire    Feeling of Stress : Not at all  Social Connections: Moderately Isolated (08/04/2020)   Social Connection and Isolation Panel [NHANES]    Frequency of Communication with Friends and Family: Three times a week    Frequency of Social  Gatherings with Friends and Family: More than three times a week    Attends Religious Services: More than 4 times per year    Active Member of Clubs or Organizations: No    Attends Banker Meetings: Never    Marital Status: Widowed      Review of Systems  Vital Signs:   Code Status:  Advance Care Plan: {Advance Care [id number]}    Physical Exam  Imaging: MR BRAIN WO CONTRAST  Result Date: 12/14/2022 CLINICAL DATA:  Initial evaluation for neuro deficit, stroke suspected. EXAM: MRI HEAD WITHOUT CONTRAST MRA HEAD WITHOUT CONTRAST MRA NECK WITHOUT AND WITH CONTRAST TECHNIQUE: Multiplanar, multi-echo pulse sequences of the brain and surrounding structures were acquired without intravenous contrast. Angiographic images of the Circle of Willis were acquired using MRA technique without intravenous contrast. Angiographic images of the neck were acquired using MRA technique without and with intravenous contrast. Carotid stenosis measurements (when applicable) are obtained utilizing NASCET criteria, using the distal internal carotid diameter as the denominator. CONTRAST:  9.5mL GADAVIST GADOBUTROL 1 MMOL/ML IV SOLN COMPARISON:  CT from 12/13/2022. FINDINGS: MRI HEAD FINDINGS Brain: Mild age-related cerebral atrophy. No significant cerebral white matter disease for age. No evidence for acute or subacute ischemia. Gray-white matter differentiation maintained. No areas of chronic cortical infarction. No acute or chronic intracranial blood products. No mass lesion, midline shift or mass effect. No hydrocephalus or extra-axial fluid collection. Pituitary gland and suprasellar region within normal limits. Vascular: Major intracranial vascular flow voids are maintained. Skull and upper cervical spine: Craniocervical junction within normal limits. Bone marrow signal intensity normal. Degenerative spondylosis noted within the upper cervical spine without high-grade spinal stenosis. No scalp soft  tissue abnormality. Sinuses/Orbits: Prior bilateral ocular lens replacement. Few small maxillary sinus retention cyst noted. Paranasal sinuses are otherwise clear. No mastoid effusion. Other: None. MRA HEAD FINDINGS Anterior circulation: Both internal carotid arteries are patent to the termini without stenosis or other abnormality. A1 segments patent bilaterally. Normal anterior communicating complex. Anterior cerebral arteries widely patent without stenosis. No M1 stenosis or occlusion. No proximal MCA branch occlusion or high-grade stenosis. Distal MCA branches perfused and symmetric. Posterior circulation: Both vertebral arteries widely patent without stenosis. Left vertebral artery dominant. Both PICA patent. Basilar patent without stenosis. Superior cerebral arteries patent bilaterally. Right PCA supplied via the basilar as well as a robust right posterior communicating artery. Fetal type origin left PCA. Both PCAs patent without stenosis. Anatomic variants: As above.  No intracranial aneurysm. MRA NECK FINDINGS Aortic arch: Examination mildly degraded by motion. Visualized aortic arch normal in caliber. Bovine branching pattern noted. No stenosis about the origin the great vessels. Right carotid system: Right common and internal carotid arteries are patent without evidence for dissection. No significant hemodynamically significant stenosis about the right carotid bulb or elsewhere about the right carotid artery system. Left carotid system: Left common and internal carotid arteries are patent without evidence for dissection. No hemodynamically significant stenosis about the left carotid bulb or elsewhere about the left carotid artery system. Vertebral arteries: Both vertebral arteries arise from subclavian arteries. No visible proximal subclavian artery stenosis. Left vertebral artery dominant. No evidence for dissection. Single short-segment moderate to severe distal right P3 stenosis (series 1115, image 10).  Vertebral arteries are otherwise patent within the neck with no other visible stenosis. Other: None IMPRESSION: MRI HEAD IMPRESSION: 1. No acute intracranial abnormality. 2. Mild age-related cerebral atrophy. MRA HEAD IMPRESSION: Negative intracranial MRA for large vessel occlusion or other emergent finding. No hemodynamically significant or correctable stenosis. MRA NECK IMPRESSION: 1. Negative MRA of the neck for large vessel occlusion. 2. Single short-segment moderate to severe distal right P3 stenosis. 3. Otherwise wide patency of the major arterial vasculature of the neck. No other hemodynamically significant or correctable stenosis. Electronically Signed   By: Rise Mu M.D.   On: 12/14/2022 18:39  MR ANGIO HEAD WO CONTRAST  Result Date: 12/14/2022 CLINICAL DATA:  Initial evaluation for neuro deficit, stroke suspected. EXAM: MRI HEAD WITHOUT CONTRAST MRA HEAD WITHOUT CONTRAST MRA NECK WITHOUT AND WITH CONTRAST TECHNIQUE: Multiplanar, multi-echo pulse sequences of the brain and surrounding structures were acquired without intravenous contrast. Angiographic images of the Circle of Willis were acquired using MRA technique without intravenous contrast. Angiographic images of the neck were acquired using MRA technique without and with intravenous contrast. Carotid stenosis measurements (when applicable) are obtained utilizing NASCET criteria, using the distal internal carotid diameter as the denominator. CONTRAST:  9.72mL GADAVIST GADOBUTROL 1 MMOL/ML IV SOLN COMPARISON:  CT from 12/13/2022. FINDINGS: MRI HEAD FINDINGS Brain: Mild age-related cerebral atrophy. No significant cerebral white matter disease for age. No evidence for acute or subacute ischemia. Gray-white matter differentiation maintained. No areas of chronic cortical infarction. No acute or chronic intracranial blood products. No mass lesion, midline shift or mass effect. No hydrocephalus or extra-axial fluid collection. Pituitary gland  and suprasellar region within normal limits. Vascular: Major intracranial vascular flow voids are maintained. Skull and upper cervical spine: Craniocervical junction within normal limits. Bone marrow signal intensity normal. Degenerative spondylosis noted within the upper cervical spine without high-grade spinal stenosis. No scalp soft tissue abnormality. Sinuses/Orbits: Prior bilateral ocular lens replacement. Few small maxillary sinus retention cyst noted. Paranasal sinuses are otherwise clear. No mastoid effusion. Other: None. MRA HEAD FINDINGS Anterior circulation: Both internal carotid arteries are patent to the termini without stenosis or other abnormality. A1 segments patent bilaterally. Normal anterior communicating complex. Anterior cerebral arteries widely patent without stenosis. No M1 stenosis or occlusion. No proximal MCA branch occlusion or high-grade stenosis. Distal MCA branches perfused and symmetric. Posterior circulation: Both vertebral arteries widely patent without stenosis. Left vertebral artery dominant. Both PICA patent. Basilar patent without stenosis. Superior cerebral arteries patent bilaterally. Right PCA supplied via the basilar as well as a robust right posterior communicating artery. Fetal type origin left PCA. Both PCAs patent without stenosis. Anatomic variants: As above.  No intracranial aneurysm. MRA NECK FINDINGS Aortic arch: Examination mildly degraded by motion. Visualized aortic arch normal in caliber. Bovine branching pattern noted. No stenosis about the origin the great vessels. Right carotid system: Right common and internal carotid arteries are patent without evidence for dissection. No significant hemodynamically significant stenosis about the right carotid bulb or elsewhere about the right carotid artery system. Left carotid system: Left common and internal carotid arteries are patent without evidence for dissection. No hemodynamically significant stenosis about the left  carotid bulb or elsewhere about the left carotid artery system. Vertebral arteries: Both vertebral arteries arise from subclavian arteries. No visible proximal subclavian artery stenosis. Left vertebral artery dominant. No evidence for dissection. Single short-segment moderate to severe distal right P3 stenosis (series 1115, image 10). Vertebral arteries are otherwise patent within the neck with no other visible stenosis. Other: None IMPRESSION: MRI HEAD IMPRESSION: 1. No acute intracranial abnormality. 2. Mild age-related cerebral atrophy. MRA HEAD IMPRESSION: Negative intracranial MRA for large vessel occlusion or other emergent finding. No hemodynamically significant or correctable stenosis. MRA NECK IMPRESSION: 1. Negative MRA of the neck for large vessel occlusion. 2. Single short-segment moderate to severe distal right P3 stenosis. 3. Otherwise wide patency of the major arterial vasculature of the neck. No other hemodynamically significant or correctable stenosis. Electronically Signed   By: Rise Mu M.D.   On: 12/14/2022 18:39   MR ANGIO NECK W WO CONTRAST  Result Date: 12/14/2022 CLINICAL DATA:  Initial evaluation for neuro deficit, stroke suspected. EXAM: MRI HEAD WITHOUT CONTRAST MRA HEAD WITHOUT CONTRAST MRA NECK WITHOUT AND WITH CONTRAST TECHNIQUE: Multiplanar, multi-echo pulse sequences of the brain and surrounding structures were acquired without intravenous contrast. Angiographic images of the Circle of Willis were acquired using MRA technique without intravenous contrast. Angiographic images of the neck were acquired using MRA technique without and with intravenous contrast. Carotid stenosis measurements (when applicable) are obtained utilizing NASCET criteria, using the distal internal carotid diameter as the denominator. CONTRAST:  9.40mL GADAVIST GADOBUTROL 1 MMOL/ML IV SOLN COMPARISON:  CT from 12/13/2022. FINDINGS: MRI HEAD FINDINGS Brain: Mild age-related cerebral atrophy. No  significant cerebral white matter disease for age. No evidence for acute or subacute ischemia. Gray-white matter differentiation maintained. No areas of chronic cortical infarction. No acute or chronic intracranial blood products. No mass lesion, midline shift or mass effect. No hydrocephalus or extra-axial fluid collection. Pituitary gland and suprasellar region within normal limits. Vascular: Major intracranial vascular flow voids are maintained. Skull and upper cervical spine: Craniocervical junction within normal limits. Bone marrow signal intensity normal. Degenerative spondylosis noted within the upper cervical spine without high-grade spinal stenosis. No scalp soft tissue abnormality. Sinuses/Orbits: Prior bilateral ocular lens replacement. Few small maxillary sinus retention cyst noted. Paranasal sinuses are otherwise clear. No mastoid effusion. Other: None. MRA HEAD FINDINGS Anterior circulation: Both internal carotid arteries are patent to the termini without stenosis or other abnormality. A1 segments patent bilaterally. Normal anterior communicating complex. Anterior cerebral arteries widely patent without stenosis. No M1 stenosis or occlusion. No proximal MCA branch occlusion or high-grade stenosis. Distal MCA branches perfused and symmetric. Posterior circulation: Both vertebral arteries widely patent without stenosis. Left vertebral artery dominant. Both PICA patent. Basilar patent without stenosis. Superior cerebral arteries patent bilaterally. Right PCA supplied via the basilar as well as a robust right posterior communicating artery. Fetal type origin left PCA. Both PCAs patent without stenosis. Anatomic variants: As above.  No intracranial aneurysm. MRA NECK FINDINGS Aortic arch: Examination mildly degraded by motion. Visualized aortic arch normal in caliber. Bovine branching pattern noted. No stenosis about the origin the great vessels. Right carotid system: Right common and internal carotid  arteries are patent without evidence for dissection. No significant hemodynamically significant stenosis about the right carotid bulb or elsewhere about the right carotid artery system. Left carotid system: Left common and internal carotid arteries are patent without evidence for dissection. No hemodynamically significant stenosis about the left carotid bulb or elsewhere about the left carotid artery system. Vertebral arteries: Both vertebral arteries arise from subclavian arteries. No visible proximal subclavian artery stenosis. Left vertebral artery dominant. No evidence for dissection. Single short-segment moderate to severe distal right P3 stenosis (series 1115, image 10). Vertebral arteries are otherwise patent within the neck with no other visible stenosis. Other: None IMPRESSION: MRI HEAD IMPRESSION: 1. No acute intracranial abnormality. 2. Mild age-related cerebral atrophy. MRA HEAD IMPRESSION: Negative intracranial MRA for large vessel occlusion or other emergent finding. No hemodynamically significant or correctable stenosis. MRA NECK IMPRESSION: 1. Negative MRA of the neck for large vessel occlusion. 2. Single short-segment moderate to severe distal right P3 stenosis. 3. Otherwise wide patency of the major arterial vasculature of the neck. No other hemodynamically significant or correctable stenosis. Electronically Signed   By: Rise Mu M.D.   On: 12/14/2022 18:39   ECHOCARDIOGRAM COMPLETE  Result Date: 12/14/2022    ECHOCARDIOGRAM REPORT   Patient Name:   JAXXON DATEMA Date of Exam: 12/14/2022  Medical Rec #:  [id number]       Height:       68.0 in Accession #:    [id number]      Weight:       213.0 lb Date of Birth:  [birth year]       BSA:          2.099 m Patient Age:    81 years        BP:           134/60 mmHg Patient Gender: M               HR:           93 bpm. Exam Location:  Inpatient Procedure: 2D Echo, Cardiac Doppler, Color Doppler and Intracardiac            Opacification Agent  Indications:    Stroke  History:        Patient has prior history of Echocardiogram examinations, most                 recent 07/24/2018. CAD, cancer, Arrythmias:Atrial Fibrillation                 and AV block; Risk Factors:Sleep Apnea, Hypertension and Former                 Smoker.  Sonographer:    Wallie Char Referring Phys: [id number] Marvel Plan  Sonographer Comments: Technically challenging study due to limited acoustic windows. IMPRESSIONS  1. LVEF mildly reduced with apical motion consistent with RV pacing. Left ventricular ejection fraction, by estimation, is 45 to 50%. The left ventricle has mildly decreased function. The left ventricle has no regional wall motion abnormalities. There is mild concentric left ventricular hypertrophy. Left ventricular diastolic parameters are consistent with Grade I diastolic dysfunction (impaired relaxation).  2. Right ventricular systolic function is normal. The right ventricular size is normal. Tricuspid regurgitation signal is inadequate for assessing PA pressure.  3. The mitral valve is grossly normal. Trivial mitral valve regurgitation. No evidence of mitral stenosis.  4. The aortic valve was not well visualized. Aortic valve regurgitation is not visualized. No aortic stenosis is present.  5. The inferior vena cava is normal in size with greater than 50% respiratory variability, suggesting right atrial pressure of 3 mmHg. Conclusion(s)/Recommendation(s): No intracardiac source of embolism detected on this transthoracic study. Consider a transesophageal echocardiogram to exclude cardiac source of embolism if clinically indicated. FINDINGS  Left Ventricle: LVEF mildly reduced with apical motion consistent with RV pacing. Left ventricular ejection fraction, by estimation, is 45 to 50%. The left ventricle has mildly decreased function. The left ventricle has no regional wall motion abnormalities. Definity contrast agent was given IV to delineate the left ventricular  endocardial borders. The left ventricular internal cavity size was normal in size. There is mild concentric left ventricular hypertrophy. Abnormal (paradoxical) septal motion, consistent with RV pacemaker. Left ventricular diastolic parameters are consistent with Grade I diastolic dysfunction (impaired relaxation). Right Ventricle: The right ventricular size is normal. No increase in right ventricular wall thickness. Right ventricular systolic function is normal. Tricuspid regurgitation signal is inadequate for assessing PA pressure. Left Atrium: Left atrial size was normal in size. Right Atrium: Right atrial size was normal in size. Pericardium: There is no evidence of pericardial effusion. Mitral Valve: The mitral valve is grossly normal. Trivial mitral valve regurgitation. No evidence of mitral valve stenosis. MV peak gradient, 3.9 mmHg. The mean mitral valve gradient is 1.0  mmHg. Tricuspid Valve: The tricuspid valve is grossly normal. Tricuspid valve regurgitation is not demonstrated. No evidence of tricuspid stenosis. Aortic Valve: The aortic valve was not well visualized. Aortic valve regurgitation is not visualized. No aortic stenosis is present. Aortic valve mean gradient measures 6.5 mmHg. Aortic valve peak gradient measures 12.5 mmHg. Aortic valve area, by VTI measures 3.05 cm. Pulmonic Valve: The pulmonic valve was not well visualized. Aorta: The aortic root and ascending aorta are structurally normal, with no evidence of dilitation. Venous: The inferior vena cava is normal in size with greater than 50% respiratory variability, suggesting right atrial pressure of 3 mmHg. IAS/Shunts: The atrial septum is grossly normal.  LEFT VENTRICLE PLAX 2D LVIDd:         5.20 cm      Diastology LVIDs:         4.10 cm      LV e' medial:    5.50 cm/s LV PW:         1.10 cm      LV E/e' medial:  12.6 LV IVS:        1.20 cm      LV e' lateral:   4.32 cm/s LVOT diam:     2.10 cm      LV E/e' lateral: 16.0 LV SV:          104 LV SV Index:   50 LVOT Area:     3.46 cm  LV Volumes (MOD) LV vol d, MOD A2C: 223.0 ml LV vol d, MOD A4C: 185.0 ml LV vol s, MOD A2C: 126.0 ml LV vol s, MOD A4C: 108.0 ml LV SV MOD A2C:     97.0 ml LV SV MOD A4C:     185.0 ml LV SV MOD BP:      87.1 ml RIGHT VENTRICLE             IVC RV Basal diam:  3.90 cm     IVC diam: 1.60 cm RV S prime:     18.10 cm/s TAPSE (M-mode): 2.8 cm LEFT ATRIUM             Index        RIGHT ATRIUM           Index LA Vol (A2C):   86.6 ml 41.26 ml/m  RA Area:     22.90 cm LA Vol (A4C):   99.3 ml 47.31 ml/m  RA Volume:   64.90 ml  30.92 ml/m LA Biplane Vol: 92.7 ml 44.16 ml/m  AORTIC VALVE AV Area (Vmax):    2.78 cm AV Area (Vmean):   3.01 cm AV Area (VTI):     3.05 cm AV Vmax:           177.00 cm/s AV Vmean:          121.000 cm/s AV VTI:            0.342 m AV Peak Grad:      12.5 mmHg AV Mean Grad:      6.5 mmHg LVOT Vmax:         142.00 cm/s LVOT Vmean:        105.000 cm/s LVOT VTI:          0.301 m LVOT/AV VTI ratio: 0.88  AORTA Ao Root diam: 3.60 cm Ao Asc diam:  3.30 cm MITRAL VALVE MV Area (PHT): 2.17 cm     SHUNTS MV Area VTI:   3.38 cm     Systemic VTI:  0.30 m MV Peak grad:  3.9 mmHg     Systemic Diam: 2.10 cm MV Mean grad:  1.0 mmHg MV Vmax:       0.99 m/s MV Vmean:      56.1 cm/s MV Decel Time: 350 msec MV E velocity: 69.10 cm/s MV A velocity: 111.00 cm/s MV E/A ratio:  0.62 Lennie Odor MD Electronically signed by Lennie Odor MD Signature Date/Time: 12/14/2022/3:39:36 PM    Final    DG ERCP  Result Date: 12/14/2022 CLINICAL DATA:  ERCP EXAM: ERCP TECHNIQUE: Multiple spot images obtained with the fluoroscopic device and submitted for interpretation post-procedure. FLUOROSCOPY: Refer to separate report COMPARISON:  None Available. FINDINGS: A total of 10 fluoroscopic spot images taken during ERCP are submitted for review. Initial images demonstrate a scope overlying the upper abdomen. Wire catheterization and contrast injection of the common bile duct are  performed. There is a somewhat bulbous appearance of the proximal CBD and dilated appearance of the proximal intrahepatic biliary ducts. A plastic biliary stent is left in place and the scope was withdrawn. IMPRESSION: ERCP images as described. Refer to separate procedure report for full details. These images were submitted for radiologic interpretation only. Please see the procedural report for the amount of contrast and the fluoroscopy time utilized. Electronically Signed   By: Olive Bass M.D.   On: 12/14/2022 08:25   CT HEAD WO CONTRAST ( )  Result Date: 12/13/2022 CLINICAL DATA:  Vision loss, double vision EXAM: CT HEAD WITHOUT CONTRAST TECHNIQUE: Contiguous axial images were obtained from the base of the skull through the vertex without intravenous contrast. RADIATION DOSE REDUCTION: This exam was performed according to the departmental dose-optimization program which includes automated exposure control, adjustment of the mA and/or kV according to patient size and/or use of iterative reconstruction technique. COMPARISON:  03/15/2022 CT maxillofacial, 05/27/2020 CT head FINDINGS: Brain: No evidence of acute infarction, hemorrhage, mass, mass effect, or midline shift. No hydrocephalus or extra-axial fluid collection. Vascular: No hyperdense vessel. Skull: Negative for fracture or focal lesion. Sinuses/Orbits: Mucosal thickening and mucous retention cysts in the maxillary sinuses. Mild mucosal thickening in the ethmoid air cells and right frontal sinus. Status post bilateral lens replacements. Other: The mastoid air cells are well aerated. IMPRESSION: No acute intracranial process. Electronically Signed   By: Wiliam Ke M.D.   On: 12/13/2022 17:59    Labs:  CBC: Recent Labs    12/20/22 1515 12/23/22 1144 12/24/22 0935 12/29/22 1028  WBC 7.8 5.7 11.4* 5.7  HGB 10.1* 8.6* 8.5* 8.7*  HCT 31.5* 26.9* 25.4* 26.7*  PLT 226 216 247 266    COAGS: Recent Labs    12/13/22 0047 12/13/22 1127  12/13/22 1727 12/14/22 0037  INR 2.5* 2.0* 1.6* 1.4*    BMP: Recent Labs    12/14/22 0738 12/15/22 0017 12/17/22 0822 12/23/22 1235 12/29/22 1028  NA 137 137 140 136 141  K 3.6 3.5 4.1 3.6 4.0  CL 106 104 106 109 109  CO2 23 23 26  21* 27  GLUCOSE 122* 112* 106* 104* 132*  BUN 25* 25* 30* 30* 22  CALCIUM 9.1 8.8* 9.0 8.2* 8.5*  CREATININE 1.25* 1.18 1.11 1.00 1.00  GFRNONAA 57* >60  --  >60 >60    LIVER FUNCTION TESTS: Recent Labs    12/15/22 0017 12/17/22 0822 12/23/22 1235 12/29/22 1028  BILITOT 5.9* 4.1* 2.3* 1.4*  AST 156* 181* 96* 25  ALT 358* 320* 190* 63*  ALKPHOS 584* 539* 387* 234*  PROT  6.0* 6.3 5.5* 5.6*  ALBUMIN 2.7* 3.4* 2.7* 3.2*    TUMOR MARKERS: No results for input(s): "AFPTM", "CEA", "CA199", "CHROMGRNA" in the last 8760 hours.  Assessment and Plan: 82 y.o. male, ex smoker,  with PMH sig for CAD, afib, gout, sleep apnea on CPAP, OA, prior bariatric surgery, prostate cancer, HTN, bronchiectasis, GERD, colon cancer in his 40's, complete heart block with pacemaker who presents now with newly diagnosed pancreatic cancer. He is scheduled today for port a cath placement for palliative chemotherapy. Risks and benefits of image guided port-a-catheter placement was discussed with the patient including, but not limited to bleeding, infection, pneumothorax, or fibrin sheath development and need for additional procedures.  All of the patient's questions were answered, patient is agreeable to proceed. Consent signed and in chart.    Thank you for this interesting consult.  I greatly enjoyed meeting HAO PARRA and look forward to participating in their care.  A copy of this report was sent to the requesting provider on this date.  Electronically Signed: D. Jeananne Rama, PA-C 01/03/2023, 11:34 AM   I spent a total of  25 minutes   in face to face in clinical consultation, greater than 50% of which was counseling/coordinating care for port a cath  placement

## 2023-01-04 ENCOUNTER — Ambulatory Visit (HOSPITAL_COMMUNITY)
Admission: RE | Admit: 2023-01-04 | Discharge: 2023-01-04 | Disposition: A | Discharge status: Home / Self Care | Payer: Medicare Other | Source: Ambulatory Visit | Attending: Oncology | Admitting: Oncology

## 2023-01-04 ENCOUNTER — Other Ambulatory Visit: Payer: Self-pay

## 2023-01-04 ENCOUNTER — Encounter (HOSPITAL_COMMUNITY): Payer: Self-pay | Admitting: Oncology

## 2023-01-04 ENCOUNTER — Ambulatory Visit (HOSPITAL_COMMUNITY)
Admission: RE | Admit: 2023-01-04 | Discharge: 2023-01-04 | Disposition: A | Discharge status: Home / Self Care | Payer: No Typology Code available for payment source | Source: Ambulatory Visit | Attending: Oncology | Admitting: Oncology

## 2023-01-04 ENCOUNTER — Encounter (HOSPITAL_COMMUNITY): Payer: Self-pay

## 2023-01-04 DIAGNOSIS — M109 Gout, unspecified: Secondary | ICD-10-CM | POA: Diagnosis not present

## 2023-01-04 DIAGNOSIS — M1990 Unspecified osteoarthritis, unspecified site: Secondary | ICD-10-CM | POA: Insufficient documentation

## 2023-01-04 DIAGNOSIS — Z85038 Personal history of other malignant neoplasm of large intestine: Secondary | ICD-10-CM | POA: Diagnosis not present

## 2023-01-04 DIAGNOSIS — Z452 Encounter for adjustment and management of vascular access device: Secondary | ICD-10-CM | POA: Diagnosis not present

## 2023-01-04 DIAGNOSIS — G4730 Sleep apnea, unspecified: Secondary | ICD-10-CM | POA: Diagnosis not present

## 2023-01-04 DIAGNOSIS — C259 Malignant neoplasm of pancreas, unspecified: Secondary | ICD-10-CM | POA: Diagnosis not present

## 2023-01-04 DIAGNOSIS — J471 Bronchiectasis with (acute) exacerbation: Secondary | ICD-10-CM | POA: Insufficient documentation

## 2023-01-04 DIAGNOSIS — Z8546 Personal history of malignant neoplasm of prostate: Secondary | ICD-10-CM | POA: Diagnosis not present

## 2023-01-04 DIAGNOSIS — I2510 Atherosclerotic heart disease of native coronary artery without angina pectoris: Secondary | ICD-10-CM | POA: Diagnosis not present

## 2023-01-04 DIAGNOSIS — Z9884 Bariatric surgery status: Secondary | ICD-10-CM | POA: Insufficient documentation

## 2023-01-04 DIAGNOSIS — I10 Essential (primary) hypertension: Secondary | ICD-10-CM | POA: Diagnosis not present

## 2023-01-04 DIAGNOSIS — Z8507 Personal history of malignant neoplasm of pancreas: Secondary | ICD-10-CM | POA: Diagnosis not present

## 2023-01-04 DIAGNOSIS — K219 Gastro-esophageal reflux disease without esophagitis: Secondary | ICD-10-CM | POA: Diagnosis not present

## 2023-01-04 DIAGNOSIS — Z87891 Personal history of nicotine dependence: Secondary | ICD-10-CM | POA: Diagnosis not present

## 2023-01-04 DIAGNOSIS — Z950 Presence of cardiac pacemaker: Secondary | ICD-10-CM | POA: Insufficient documentation

## 2023-01-04 DIAGNOSIS — K8689 Other specified diseases of pancreas: Secondary | ICD-10-CM

## 2023-01-04 DIAGNOSIS — I4891 Unspecified atrial fibrillation: Secondary | ICD-10-CM | POA: Insufficient documentation

## 2023-01-04 HISTORY — PX: IR IMAGING GUIDED PORT INSERTION: IMG5740

## 2023-01-04 LAB — GENETIC SCREENING ORDER

## 2023-01-04 MED ORDER — LIDOCAINE-EPINEPHRINE 1 %-1:100000 IJ SOLN
INTRAMUSCULAR | Status: AC
  Filled 2023-01-04: qty 1

## 2023-01-04 MED ORDER — MIDAZOLAM HCL 2 MG/2ML IJ SOLN
INTRAMUSCULAR | Status: AC
  Filled 2023-01-04: qty 2

## 2023-01-04 MED ORDER — SODIUM CHLORIDE 0.9 % IV SOLN: INTRAVENOUS | Status: DC

## 2023-01-04 MED ORDER — FENTANYL CITRATE (PF) 100 MCG/2ML IJ SOLN
INTRAMUSCULAR | Status: AC | PRN
  Administered 2023-01-04 (×2): 50 ug via INTRAVENOUS

## 2023-01-04 MED ORDER — HEPARIN SOD (PORK) LOCK FLUSH 100 UNIT/ML IV SOLN
500.0000 [IU] | Freq: Once | INTRAVENOUS | Status: AC
  Administered 2023-01-04: 500 [IU] via INTRAVENOUS

## 2023-01-04 MED ORDER — HEPARIN SOD (PORK) LOCK FLUSH 100 UNIT/ML IV SOLN
INTRAVENOUS | Status: AC
  Filled 2023-01-04: qty 5

## 2023-01-04 MED ORDER — LIDOCAINE-EPINEPHRINE 1 %-1:100000 IJ SOLN
20.0000 mL | Freq: Once | INTRAMUSCULAR | Status: AC
  Administered 2023-01-04: 18 mL via INTRADERMAL

## 2023-01-04 MED ORDER — MIDAZOLAM HCL 2 MG/2ML IJ SOLN
INTRAMUSCULAR | Status: AC | PRN
  Administered 2023-01-04 (×2): 1 mg via INTRAVENOUS

## 2023-01-04 MED ORDER — FENTANYL CITRATE (PF) 100 MCG/2ML IJ SOLN
INTRAMUSCULAR | Status: AC
  Filled 2023-01-04: qty 2

## 2023-01-04 NOTE — Procedures (Signed)
Pre Procedure Dx: Poor venous access Post Procedural Dx: Same  Successful placement of right IJ approach port-a-cath with tip at the superior caval atrial junction. The catheter is ready for immediate use.  Estimated Blood Loss: Trace  Complications: None immediate.  Jay Fritzie Prioleau, MD Pager #: [phone]   

## 2023-01-04 NOTE — Discharge Instructions (Signed)
Implanted Port Insertion, Care After  Please call Interventional Radiology clinic [phone] with any questions or concerns.  You may remove your dressing and shower tomorrow.  What can I expect after the procedure? After the procedure, it is common to have: Discomfort at the port insertion site. Bruising on the skin over the port. This should improve over 3-4 days.  The port can be used right away  Your port will need to be flushed monthly to keep it open. A nurse or other health professional will do this for you.  Do not use any lotions, creams, or ointments (including EMLA cream) on your port site for 2 weeks.   Follow these instructions at home: Medication:  Do not use Aspirin or ibuprofen products, such as Advil or Motrin, as it may increase bleeding.   You may resume your usual medications as ordered by your doctor.  Port care After your port is placed, you will get a manufacturer's information card with information about your port. Keep this card with you at all times. Take care of the port as directed by your health care provider. Ask your doctor if you or a family member can get training for taking care of the port at home. A home health care nurse will be available to help care for the port. Make sure to remember what type of port you have. Keep the site clean and dry. You may cover your incision with a Band-Aid as needed.  Avoid baths, hot tubs, and swimming until the procedure site no longer has a scab and is completely healed. Do not use any creams, lotions, or ointments on the procedure site including EMLA cream for 2 weeks or until your incision is healed.   Incision care  Follow instructions from your health care provider about how to take care of your port insertion site. Make sure you: Wash your hands with soap and water for at least 20 seconds before and after you change your bandage (dressing). If soap and water are not available, use hand sanitizer. Change  your dressing as told by your health care provider. Leave stitches (sutures), skin glue, or adhesive strips in place. These skin closures may need to stay in place for 2 weeks or longer. If adhesive strip edges start to loosen and curl up, you may trim the loose edges. Do not remove adhesive strips completely unless your health care provider tells you to do that. Check your port insertion site every day for signs of infection. Check for: Redness, swelling, or pain. Fluid or blood. Warmth. Pus or a bad smell. Activity Return to your normal activities as told by your health care provider. Ask your health care provider what activities are safe for you. You may have to avoid lifting. Ask your health care provider how much you can safely lift. General instructions Take over-the-counter and prescription medicines only as told by your health care provider. If you were given a sedative during the procedure, it can affect you for several hours. Do not drive or operate machinery until your health care provider says that it is safe. Wear a medical alert bracelet in case of an emergency. This will tell any health care providers that you have a port. Keep all follow-up visits. This is important. Contact a health care provider if: You cannot flush your port with saline as directed, or you cannot draw blood from the port. You have a fever or chills. You have redness, swelling, or pain around your port insertion site.   You have fluid or blood coming from your port insertion site. Your port insertion site feels warm to the touch. You have pus or a bad smell coming from the port insertion site. Get help right away if: You have chest pain or shortness of breath. You have bleeding from your port that you cannot control. These symptoms may be an emergency. Get help right away. Call 911. Do not wait to see if the symptoms will go away. Do not drive yourself to the hospital. Summary Take care of the port as  told by your health care provider. Keep the manufacturer's information card with you at all times. Change your dressing as told by your health care provider. Contact a health care provider if you have a fever or chills or if you have redness, swelling, or pain around your port insertion site. Follow-up care is a key part of your treatment and safety. Be sure to make and go to all appointments    Moderate Conscious Sedation  Adult  Care After   After the procedure, it is common to have: Sleepiness for a few hours. Impaired judgment for a few hours. Trouble with balance. Nausea or vomiting if you eat too soon. Follow these instructions at home: For the time period you were told by your health care provider: Rest. Do not participate in activities where you could fall or become injured. Do not drive or use machinery. Do not drink alcohol. Do not take sleeping pills or medicines that cause drowsiness. Do not make important decisions or sign legal documents. Do not take care of children on your own. Eating and drinking Follow instructions from your health care provider about what you may eat and drink. Drink enough fluid to keep your urine pale yellow. If you vomit: Drink clear fluids slowly and in small amounts as you are able. Clear fluids include water, ice chips, low-calorie sports drinks, and fruit juice that has water added to it (diluted fruit juice). Eat light and bland foods in small amounts as you are able. These foods include bananas, applesauce, rice, lean meats, toast, and crackers. General instructions Take over-the-counter and prescription medicines only as told by your health care provider. Have a responsible adult stay with you for the time you are told. Do not use any products that contain nicotine or tobacco. These products include cigarettes, chewing tobacco, and vaping devices, such as e-cigarettes. If you need help quitting, ask your health care provider. Return to  your normal activities as told by your health care provider. Ask your health care provider what activities are safe for you. Your health care provider may give you more instructions. Make sure you know what you can and cannot do. Contact a health care provider if: You are still sleepy or having trouble with balance after 24 hours. You feel light-headed. You vomit every time you eat or drink. You get a rash. You have a fever. You have redness or swelling around the IV site. Get help right away if: You have trouble breathing. You start to feel confused at home. These symptoms may be an emergency. Get help right away. Call 911. Do not wait to see if the symptoms will go away. Do not drive yourself to the hospital. This information is not intended to replace advice given to you by your health care provider. Make sure you discuss any questions you have with your health care provider. 

## 2023-01-06 ENCOUNTER — Encounter: Payer: Self-pay | Admitting: Gastroenterology

## 2023-01-07 ENCOUNTER — Inpatient Hospital Stay: Payer: Medicare Other | Attending: Oncology

## 2023-01-07 ENCOUNTER — Inpatient Hospital Stay: Payer: Medicare Other

## 2023-01-07 VITALS — BP 143/63 | HR 60 | Temp 98.5°F | Resp 18 | Ht 68.0 in | Wt 220.6 lb

## 2023-01-07 DIAGNOSIS — Z5111 Encounter for antineoplastic chemotherapy: Secondary | ICD-10-CM | POA: Diagnosis not present

## 2023-01-07 DIAGNOSIS — C250 Malignant neoplasm of head of pancreas: Secondary | ICD-10-CM | POA: Diagnosis not present

## 2023-01-07 DIAGNOSIS — K769 Liver disease, unspecified: Secondary | ICD-10-CM | POA: Insufficient documentation

## 2023-01-07 LAB — CMP (CANCER CENTER ONLY)
ALT: 21 U/L (ref 0–44)
AST: 18 U/L (ref 15–41)
Albumin: 3.5 g/dL (ref 3.5–5.0)
Alkaline Phosphatase: 168 U/L — ABNORMAL HIGH (ref 38–126)
Anion gap: 8 (ref 5–15)
BUN: 21 mg/dL (ref 8–23)
CO2: 23 mmol/L (ref 22–32)
Calcium: 9 mg/dL (ref 8.9–10.3)
Chloride: 111 mmol/L (ref 98–111)
Creatinine: 0.85 mg/dL (ref 0.61–1.24)
GFR, Estimated: >60 mL/min (ref >60)
Glucose, Bld: 91 mg/dL (ref 70–99)
Potassium: 3.6 mmol/L (ref 3.5–5.1)
Sodium: 142 mmol/L (ref 135–145)
Total Bilirubin: 1.2 mg/dL (ref 0.3–1.2)
Total Protein: 5.8 g/dL — ABNORMAL LOW (ref 6.5–8.1)

## 2023-01-07 LAB — CBC WITH DIFFERENTIAL (CANCER CENTER ONLY)
Abs Immature Granulocytes: 0.02 10*3/uL (ref 0.00–0.07)
Basophils Absolute: 0 10*3/uL (ref 0.0–0.1)
Basophils Relative: 0 %
Eosinophils Absolute: 0.2 10*3/uL (ref 0.0–0.5)
Eosinophils Relative: 4 %
HCT: 27.7 % — ABNORMAL LOW (ref 39.0–52.0)
Hemoglobin: 9 g/dL — ABNORMAL LOW (ref 13.0–17.0)
Immature Granulocytes: 0 %
Lymphocytes Relative: 23 %
Lymphs Abs: 1.1 10*3/uL (ref 0.7–4.0)
MCH: 30.3 pg (ref 26.0–34.0)
MCHC: 32.5 g/dL (ref 30.0–36.0)
MCV: 93.3 fL (ref 80.0–100.0)
Monocytes Absolute: 0.4 10*3/uL (ref 0.1–1.0)
Monocytes Relative: 9 %
Neutro Abs: 3 10*3/uL (ref 1.7–7.7)
Neutrophils Relative %: 64 %
Platelet Count: 210 10*3/uL (ref 150–400)
RBC: 2.97 MIL/uL — ABNORMAL LOW (ref 4.22–5.81)
RDW: 16 % — ABNORMAL HIGH (ref 11.5–15.5)
WBC Count: 4.7 10*3/uL (ref 4.0–10.5)
nRBC: 0 % (ref 0.0–0.2)

## 2023-01-07 MED ORDER — PROCHLORPERAZINE MALEATE 10 MG PO TABS
10.0000 mg | ORAL_TABLET | Freq: Once | ORAL | Status: AC
  Administered 2023-01-07: 10 mg via ORAL
  Filled 2023-01-07: qty 1

## 2023-01-07 MED ORDER — PACLITAXEL PROTEIN-BOUND CHEMO INJECTION 100 MG
100.0000 mg/m2 | Freq: Once | INTRAVENOUS | Status: AC
  Administered 2023-01-07: 200 mg via INTRAVENOUS
  Filled 2023-01-07: qty 40

## 2023-01-07 MED ORDER — SODIUM CHLORIDE 0.9% FLUSH
10.0000 mL | INTRAVENOUS | Status: DC | PRN
  Administered 2023-01-07: 10 mL

## 2023-01-07 MED ORDER — SODIUM CHLORIDE 0.9 % IV SOLN
1000.0000 mg/m2 | Freq: Once | INTRAVENOUS | Status: AC
  Administered 2023-01-07: 2204 mg via INTRAVENOUS
  Filled 2023-01-07: qty 52.57

## 2023-01-07 MED ORDER — HEPARIN SOD (PORK) LOCK FLUSH 100 UNIT/ML IV SOLN
500.0000 [IU] | Freq: Once | INTRAVENOUS | Status: AC | PRN
  Administered 2023-01-07: 500 [IU]

## 2023-01-07 MED ORDER — SODIUM CHLORIDE 0.9 % IV SOLN: Freq: Once | INTRAVENOUS | Status: AC

## 2023-01-07 NOTE — Progress Notes (Signed)
OK to proceed with treatment with today's CMET per Dr Truett Perna.  V.O. Dr Mickle Asper, PharmD

## 2023-01-07 NOTE — Patient Instructions (Signed)
[facility]   Discharge Instructions: Thank you for choosing [facility] to provide your oncology and hematology care.   If you have a lab appointment with the Cancer Center, please go directly to the Cancer Center and check in at the registration area.   Wear comfortable clothing and clothing appropriate for easy access to any Portacath or PICC line.   We strive to give you quality time with your provider. You may need to reschedule your appointment if you arrive late (15 or more minutes).  Arriving late affects you and other patients whose appointments are after yours.  Also, if you miss three or more appointments without notifying the office, you may be dismissed from the clinic at the provider's discretion.      For prescription refill requests, have your pharmacy contact our office and allow 72 hours for refills to be completed.    Today you received the following chemotherapy and/or immunotherapy agents Paclitaxel-protein bound (ABRAXANE) & Gemcitabine (GEMZAR).   To help prevent nausea and vomiting after your treatment, we encourage you to take your nausea medication as directed.  BELOW ARE SYMPTOMS THAT SHOULD BE REPORTED IMMEDIATELY: *FEVER GREATER THAN 100.4 F (38 C) OR HIGHER *CHILLS OR SWEATING *NAUSEA AND VOMITING THAT IS NOT CONTROLLED WITH YOUR NAUSEA MEDICATION *UNUSUAL SHORTNESS OF BREATH *UNUSUAL BRUISING OR BLEEDING *URINARY PROBLEMS (pain or burning when urinating, or frequent urination) *BOWEL PROBLEMS (unusual diarrhea, constipation, pain near the anus) TENDERNESS IN MOUTH AND THROAT WITH OR WITHOUT PRESENCE OF ULCERS (sore throat, sores in mouth, or a toothache) UNUSUAL RASH, SWELLING OR PAIN  UNUSUAL VAGINAL DISCHARGE OR ITCHING   Items with * indicate a potential emergency and should be followed up as soon as possible or go to the Emergency Department if any problems should occur.  Please show the CHEMOTHERAPY  ALERT CARD or IMMUNOTHERAPY ALERT CARD at check-in to the Emergency Department and triage nurse.  Should you have questions after your visit or need to cancel or reschedule your appointment, please contact  [facility]  Dept: [phone]  and follow the prompts.  Office hours are 8:00 a.m. to 4:30 p.m. Monday - Friday. Please note that voicemails left after 4:00 p.m. may not be returned until the following business day.  We are closed weekends and major holidays. You have access to a nurse at all times for urgent questions. Please call the main number to the clinic Dept: [phone] and follow the prompts.   For any non-urgent questions, you may also contact your provider using MyChart. We now offer e-Visits for anyone 18 and older to request care online for non-urgent symptoms. For details visit [email or web address].   Also download the MyChart app! Go to the app store, search "MyChart", open the app, select , and log in with your MyChart username and password.  Paclitaxel Nanoparticle Albumin-Bound Injection What is this medication? NANOPARTICLE ALBUMIN-BOUND PACLITAXEL (Na no PAHR ti kuhl al BYOO muhn-bound PAK li TAX el) treats some types of cancer. It works by slowing down the growth of cancer cells. This medicine may be used for other purposes; ask your health care provider or pharmacist if you have questions. COMMON BRAND NAME(S): Abraxane What should I tell my care team before I take this medication? They need to know if you have any of these conditions: Liver disease Low white blood cell levels An unusual or allergic reaction to paclitaxel, albumin, other medications, foods, dyes, or preservatives   If you or your partner are pregnant or trying to get pregnant Breast-feeding How should I use this medication? This medication is injected into a vein. It is given by your care team in a hospital or clinic setting. Talk to your care team  about the use of this medication in children. Special care may be needed. Overdosage: If you think you have taken too much of this medicine contact a poison control center or emergency room at once. NOTE: This medicine is only for you. Do not share this medicine with others. What if I miss a dose? Keep appointments for follow-up doses. It is important not to miss your dose. Call your care team if you are unable to keep an appointment. What may interact with this medication? Other medications may affect the way this medication works. Talk with your care team about all of the medications you take. They may suggest changes to your treatment plan to lower the risk of side effects and to make sure your medications work as intended. This list may not describe all possible interactions. Give your health care provider a list of all the medicines, herbs, non-prescription drugs, or dietary supplements you use. Also tell them if you smoke, drink alcohol, or use illegal drugs. Some items may interact with your medicine. What should I watch for while using this medication? Your condition will be monitored carefully while you are receiving this medication. You may need blood work while taking this medication. This medication may make you feel generally unwell. This is not uncommon as chemotherapy can affect healthy cells as well as cancer cells. Report any side effects. Continue your course of treatment even though you feel ill unless your care team tells you to stop. This medication can cause serious allergic reactions. To reduce the risk, your care team may give you other medications to take before receiving this one. Be sure to follow the directions from your care team. This medication may increase your risk of getting an infection. Call your care team for advice if you get a fever, chills, sore throat, or other symptoms of a cold or flu. Do not treat yourself. Try to avoid being around people who are sick. This  medication may increase your risk to bruise or bleed. Call your care team if you notice any unusual bleeding. Be careful brushing or flossing your teeth or using a toothpick because you may get an infection or bleed more easily. If you have any dental work done, tell your dentist you are receiving this medication. Talk to your care team if you or your partner may be pregnant. Serious birth defects can occur if you take this medication during pregnancy and for 6 months after the last dose. You will need a negative pregnancy test before starting this medication. Contraception is recommended while taking this medication and for 6 months after the last dose. Your care team can help you find the option that works for you. If your partner can get pregnant, use a condom during sex while taking this medication and for 3 months after the last dose. Do not breastfeed while taking this medication and for 2 weeks after the last dose. This medication may cause infertility. Talk to your care team if you are concerned about your fertility. What side effects may I notice from receiving this medication? Side effects that you should report to your care team as soon as possible: Allergic reactions--skin rash, itching, hives, swelling of the face, lips, tongue, or throat Dry cough,   shortness of breath or trouble breathing Infection--fever, chills, cough, sore throat, wounds that don't heal, pain or trouble when passing urine, general feeling of discomfort or being unwell Low red blood cell level--unusual weakness or fatigue, dizziness, headache, trouble breathing Pain, tingling, or numbness in the hands or feet Stomach pain, unusual weakness or fatigue, nausea, vomiting, diarrhea, or fever that lasts longer than expected Unusual bruising or bleeding Side effects that usually do not require medical attention (report to your care team if they continue or are bothersome): Diarrhea Fatigue Hair loss Loss of  appetite Nausea Vomiting This list may not describe all possible side effects. Call your doctor for medical advice about side effects. You may report side effects to FDA at 1-800-FDA-1088. Where should I keep my medication? This medication is given in a hospital or clinic. It will not be stored at home. NOTE: This sheet is a summary. It may not cover all possible information. If you have questions about this medicine, talk to your doctor, pharmacist, or health care provider.  2024 Elsevier/Gold Standard (2021-11-05 00:00:00)  Gemcitabine Injection What is this medication? GEMCITABINE (jem SYE ta been) treats some types of cancer. It works by slowing down the growth of cancer cells. This medicine may be used for other purposes; ask your health care provider or pharmacist if you have questions. COMMON BRAND NAME(S): Gemzar, Infugem What should I tell my care team before I take this medication? They need to know if you have any of these conditions: Blood disorders Infection Kidney disease Liver disease Lung or breathing disease, such as asthma or COPD Recent or ongoing radiation therapy An unusual or allergic reaction to gemcitabine, other medications, foods, dyes, or preservatives If you or your partner are pregnant or trying to get pregnant Breast-feeding How should I use this medication? This medication is injected into a vein. It is given by your care team in a hospital or clinic setting. Talk to your care team about the use of this medication in children. Special care may be needed. Overdosage: If you think you have taken too much of this medicine contact a poison control center or emergency room at once. NOTE: This medicine is only for you. Do not share this medicine with others. What if I miss a dose? Keep appointments for follow-up doses. It is important not to miss your dose. Call your care team if you are unable to keep an appointment. What may interact with this  medication? Interactions have not been studied. This list may not describe all possible interactions. Give your health care provider a list of all the medicines, herbs, non-prescription drugs, or dietary supplements you use. Also tell them if you smoke, drink alcohol, or use illegal drugs. Some items may interact with your medicine. What should I watch for while using this medication? Your condition will be monitored carefully while you are receiving this medication. This medication may make you feel generally unwell. This is not uncommon, as chemotherapy can affect healthy cells as well as cancer cells. Report any side effects. Continue your course of treatment even though you feel ill unless your care team tells you to stop. In some cases, you may be given additional medications to help with side effects. Follow all directions for their use. This medication may increase your risk of getting an infection. Call your care team for advice if you get a fever, chills, sore throat, or other symptoms of a cold or flu. Do not treat yourself. Try to avoid being around   people who are sick. This medication may increase your risk to bruise or bleed. Call your care team if you notice any unusual bleeding. Be careful brushing or flossing your teeth or using a toothpick because you may get an infection or bleed more easily. If you have any dental work done, tell your dentist you are receiving this medication. Avoid taking medications that contain aspirin, acetaminophen, ibuprofen, naproxen, or ketoprofen unless instructed by your care team. These medications may hide a fever. Talk to your care team if you or your partner wish to become pregnant or think you might be pregnant. This medication can cause serious birth defects if taken during pregnancy and for 6 months after the last dose. A negative pregnancy test is required before starting this medication. A reliable form of contraception is recommended while taking  this medication and for 6 months after the last dose. Talk to your care team about effective forms of contraception. Do not father a child while taking this medication and for 3 months after the last dose. Use a condom while having sex during this time period. Do not breastfeed while taking this medication and for at least 1 week after the last dose. This medication may cause infertility. Talk to your care team if you are concerned about your fertility. What side effects may I notice from receiving this medication? Side effects that you should report to your care team as soon as possible: Allergic reactions--skin rash, itching, hives, swelling of the face, lips, tongue, or throat Capillary leak syndrome--stomach or muscle pain, unusual weakness or fatigue, feeling faint or lightheaded, decrease in the amount of urine, swelling of the ankles, hands, or feet, trouble breathing Infection--fever, chills, cough, sore throat, wounds that don't heal, pain or trouble when passing urine, general feeling of discomfort or being unwell Liver injury--right upper belly pain, loss of appetite, nausea, light-colored stool, dark yellow or brown urine, yellowing skin or eyes, unusual weakness or fatigue Low red blood cell level--unusual weakness or fatigue, dizziness, headache, trouble breathing Lung injury--shortness of breath or trouble breathing, cough, spitting up blood, chest pain, fever Stomach pain, bloody diarrhea, pale skin, unusual weakness or fatigue, decrease in the amount of urine, which may be signs of hemolytic uremic syndrome Sudden and severe headache, confusion, change in vision, seizures, which may be signs of posterior reversible encephalopathy syndrome (PRES) Unusual bruising or bleeding Side effects that usually do not require medical attention (report to your care team if they continue or are bothersome): Diarrhea Drowsiness Hair loss Nausea Pain, redness, or swelling with sores inside the  mouth or throat Vomiting This list may not describe all possible side effects. Call your doctor for medical advice about side effects. You may report side effects to FDA at 1-800-FDA-1088. Where should I keep my medication? This medication is given in a hospital or clinic. It will not be stored at home. NOTE: This sheet is a summary. It may not cover all possible information. If you have questions about this medicine, talk to your doctor, pharmacist, or health care provider.  2024 [employer]vier/Gold Standard (2021-10-27 00:00:00)  

## 2023-01-09 ENCOUNTER — Emergency Department (HOSPITAL_COMMUNITY): Payer: No Typology Code available for payment source

## 2023-01-09 ENCOUNTER — Inpatient Hospital Stay (HOSPITAL_COMMUNITY)
Admission: EM | Admit: 2023-01-09 | Discharge: 2023-01-13 | DRG: 871 | Disposition: A | Discharge status: Home Health | Payer: No Typology Code available for payment source | Attending: Internal Medicine | Admitting: Internal Medicine

## 2023-01-09 ENCOUNTER — Encounter (HOSPITAL_COMMUNITY): Payer: Self-pay | Admitting: *Deleted

## 2023-01-09 ENCOUNTER — Other Ambulatory Visit: Payer: Self-pay

## 2023-01-09 DIAGNOSIS — M109 Gout, unspecified: Secondary | ICD-10-CM | POA: Diagnosis present

## 2023-01-09 DIAGNOSIS — R6520 Severe sepsis without septic shock: Secondary | ICD-10-CM | POA: Diagnosis present

## 2023-01-09 DIAGNOSIS — I5043 Acute on chronic combined systolic (congestive) and diastolic (congestive) heart failure: Secondary | ICD-10-CM | POA: Diagnosis present

## 2023-01-09 DIAGNOSIS — Y846 Urinary catheterization as the cause of abnormal reaction of the patient, or of later complication, without mention of misadventure at the time of the procedure: Secondary | ICD-10-CM | POA: Diagnosis not present

## 2023-01-09 DIAGNOSIS — C259 Malignant neoplasm of pancreas, unspecified: Secondary | ICD-10-CM | POA: Diagnosis not present

## 2023-01-09 DIAGNOSIS — J440 Chronic obstructive pulmonary disease with acute lower respiratory infection: Secondary | ICD-10-CM | POA: Diagnosis present

## 2023-01-09 DIAGNOSIS — M1000 Idiopathic gout, unspecified site: Secondary | ICD-10-CM

## 2023-01-09 DIAGNOSIS — I5040 Unspecified combined systolic (congestive) and diastolic (congestive) heart failure: Secondary | ICD-10-CM

## 2023-01-09 DIAGNOSIS — E876 Hypokalemia: Secondary | ICD-10-CM

## 2023-01-09 DIAGNOSIS — I442 Atrioventricular block, complete: Secondary | ICD-10-CM | POA: Diagnosis present

## 2023-01-09 DIAGNOSIS — N401 Enlarged prostate with lower urinary tract symptoms: Secondary | ICD-10-CM | POA: Diagnosis present

## 2023-01-09 DIAGNOSIS — W07XXXA Fall from chair, initial encounter: Secondary | ICD-10-CM | POA: Diagnosis not present

## 2023-01-09 DIAGNOSIS — D849 Immunodeficiency, unspecified: Secondary | ICD-10-CM | POA: Diagnosis present

## 2023-01-09 DIAGNOSIS — R7989 Other specified abnormal findings of blood chemistry: Secondary | ICD-10-CM | POA: Diagnosis present

## 2023-01-09 DIAGNOSIS — J441 Chronic obstructive pulmonary disease with (acute) exacerbation: Secondary | ICD-10-CM | POA: Diagnosis present

## 2023-01-09 DIAGNOSIS — I482 Chronic atrial fibrillation, unspecified: Secondary | ICD-10-CM | POA: Diagnosis present

## 2023-01-09 DIAGNOSIS — Z803 Family history of malignant neoplasm of breast: Secondary | ICD-10-CM

## 2023-01-09 DIAGNOSIS — Z9884 Bariatric surgery status: Secondary | ICD-10-CM

## 2023-01-09 DIAGNOSIS — Z7901 Long term (current) use of anticoagulants: Secondary | ICD-10-CM

## 2023-01-09 DIAGNOSIS — R3911 Hesitancy of micturition: Secondary | ICD-10-CM | POA: Diagnosis present

## 2023-01-09 DIAGNOSIS — Z9889 Other specified postprocedural states: Secondary | ICD-10-CM

## 2023-01-09 DIAGNOSIS — G4733 Obstructive sleep apnea (adult) (pediatric): Secondary | ICD-10-CM | POA: Diagnosis present

## 2023-01-09 DIAGNOSIS — N179 Acute kidney failure, unspecified: Secondary | ICD-10-CM | POA: Diagnosis present

## 2023-01-09 DIAGNOSIS — L299 Pruritus, unspecified: Secondary | ICD-10-CM | POA: Diagnosis present

## 2023-01-09 DIAGNOSIS — K828 Other specified diseases of gallbladder: Secondary | ICD-10-CM | POA: Diagnosis present

## 2023-01-09 DIAGNOSIS — Z8249 Family history of ischemic heart disease and other diseases of the circulatory system: Secondary | ICD-10-CM

## 2023-01-09 DIAGNOSIS — J13 Pneumonia due to Streptococcus pneumoniae: Secondary | ICD-10-CM | POA: Insufficient documentation

## 2023-01-09 DIAGNOSIS — C250 Malignant neoplasm of head of pancreas: Secondary | ICD-10-CM | POA: Diagnosis present

## 2023-01-09 DIAGNOSIS — E785 Hyperlipidemia, unspecified: Secondary | ICD-10-CM | POA: Diagnosis present

## 2023-01-09 DIAGNOSIS — I509 Heart failure, unspecified: Secondary | ICD-10-CM

## 2023-01-09 DIAGNOSIS — E8809 Other disorders of plasma-protein metabolism, not elsewhere classified: Secondary | ICD-10-CM | POA: Diagnosis present

## 2023-01-09 DIAGNOSIS — D61810 Antineoplastic chemotherapy induced pancytopenia: Secondary | ICD-10-CM | POA: Diagnosis present

## 2023-01-09 DIAGNOSIS — X58XXXA Exposure to other specified factors, initial encounter: Secondary | ICD-10-CM | POA: Diagnosis present

## 2023-01-09 DIAGNOSIS — I5023 Acute on chronic systolic (congestive) heart failure: Secondary | ICD-10-CM

## 2023-01-09 DIAGNOSIS — R627 Adult failure to thrive: Secondary | ICD-10-CM | POA: Diagnosis present

## 2023-01-09 DIAGNOSIS — Z96652 Presence of left artificial knee joint: Secondary | ICD-10-CM | POA: Diagnosis present

## 2023-01-09 DIAGNOSIS — C787 Secondary malignant neoplasm of liver and intrahepatic bile duct: Secondary | ICD-10-CM | POA: Diagnosis present

## 2023-01-09 DIAGNOSIS — D701 Agranulocytosis secondary to cancer chemotherapy: Secondary | ICD-10-CM | POA: Diagnosis present

## 2023-01-09 DIAGNOSIS — T451X5A Adverse effect of antineoplastic and immunosuppressive drugs, initial encounter: Secondary | ICD-10-CM | POA: Diagnosis present

## 2023-01-09 DIAGNOSIS — Z9841 Cataract extraction status, right eye: Secondary | ICD-10-CM

## 2023-01-09 DIAGNOSIS — J449 Chronic obstructive pulmonary disease, unspecified: Secondary | ICD-10-CM | POA: Insufficient documentation

## 2023-01-09 DIAGNOSIS — R791 Abnormal coagulation profile: Secondary | ICD-10-CM | POA: Diagnosis present

## 2023-01-09 DIAGNOSIS — H903 Sensorineural hearing loss, bilateral: Secondary | ICD-10-CM | POA: Diagnosis present

## 2023-01-09 DIAGNOSIS — I130 Hypertensive heart and chronic kidney disease with heart failure and stage 1 through stage 4 chronic kidney disease, or unspecified chronic kidney disease: Secondary | ICD-10-CM | POA: Diagnosis present

## 2023-01-09 DIAGNOSIS — N182 Chronic kidney disease, stage 2 (mild): Secondary | ICD-10-CM | POA: Diagnosis present

## 2023-01-09 DIAGNOSIS — R300 Dysuria: Secondary | ICD-10-CM | POA: Diagnosis present

## 2023-01-09 DIAGNOSIS — R3915 Urgency of urination: Secondary | ICD-10-CM | POA: Diagnosis present

## 2023-01-09 DIAGNOSIS — Z9842 Cataract extraction status, left eye: Secondary | ICD-10-CM

## 2023-01-09 DIAGNOSIS — Z79899 Other long term (current) drug therapy: Secondary | ICD-10-CM

## 2023-01-09 DIAGNOSIS — Z9049 Acquired absence of other specified parts of digestive tract: Secondary | ICD-10-CM

## 2023-01-09 DIAGNOSIS — N390 Urinary tract infection, site not specified: Secondary | ICD-10-CM | POA: Diagnosis present

## 2023-01-09 DIAGNOSIS — A419 Sepsis, unspecified organism: Secondary | ICD-10-CM | POA: Diagnosis not present

## 2023-01-09 DIAGNOSIS — E669 Obesity, unspecified: Secondary | ICD-10-CM | POA: Diagnosis present

## 2023-01-09 DIAGNOSIS — Z823 Family history of stroke: Secondary | ICD-10-CM

## 2023-01-09 DIAGNOSIS — Z87891 Personal history of nicotine dependence: Secondary | ICD-10-CM

## 2023-01-09 DIAGNOSIS — K810 Acute cholecystitis: Secondary | ICD-10-CM | POA: Diagnosis not present

## 2023-01-09 DIAGNOSIS — M1A9XX0 Chronic gout, unspecified, without tophus (tophi): Secondary | ICD-10-CM | POA: Diagnosis not present

## 2023-01-09 DIAGNOSIS — Z91041 Radiographic dye allergy status: Secondary | ICD-10-CM

## 2023-01-09 DIAGNOSIS — I2510 Atherosclerotic heart disease of native coronary artery without angina pectoris: Secondary | ICD-10-CM | POA: Diagnosis present

## 2023-01-09 DIAGNOSIS — J189 Pneumonia, unspecified organism: Secondary | ICD-10-CM | POA: Diagnosis not present

## 2023-01-09 DIAGNOSIS — Z86010 Personal history of colonic polyps: Secondary | ICD-10-CM

## 2023-01-09 DIAGNOSIS — Z8546 Personal history of malignant neoplasm of prostate: Secondary | ICD-10-CM

## 2023-01-09 DIAGNOSIS — Z961 Presence of intraocular lens: Secondary | ICD-10-CM | POA: Diagnosis present

## 2023-01-09 DIAGNOSIS — Z6834 Body mass index (BMI) 34.0-34.9, adult: Secondary | ICD-10-CM

## 2023-01-09 DIAGNOSIS — K219 Gastro-esophageal reflux disease without esophagitis: Secondary | ICD-10-CM | POA: Diagnosis present

## 2023-01-09 DIAGNOSIS — I480 Paroxysmal atrial fibrillation: Secondary | ICD-10-CM | POA: Diagnosis present

## 2023-01-09 DIAGNOSIS — Z85038 Personal history of other malignant neoplasm of large intestine: Secondary | ICD-10-CM

## 2023-01-09 DIAGNOSIS — T83098A Other mechanical complication of other indwelling urethral catheter, initial encounter: Secondary | ICD-10-CM | POA: Diagnosis not present

## 2023-01-09 LAB — RESPIRATORY PANEL BY PCR

## 2023-01-09 LAB — CBC WITH DIFFERENTIAL/PLATELET
Abs Immature Granulocytes: 0.02 10*3/uL (ref 0.00–0.07)
Basophils Absolute: 0 10*3/uL (ref 0.0–0.1)
Basophils Relative: 0 %
Eosinophils Absolute: 0 10*3/uL (ref 0.0–0.5)
Eosinophils Relative: 0 %
HCT: 28.1 % — ABNORMAL LOW (ref 39.0–52.0)
Hemoglobin: 8.9 g/dL — ABNORMAL LOW (ref 13.0–17.0)
Immature Granulocytes: 0 %
Lymphocytes Relative: 10 %
Lymphs Abs: 0.5 10*3/uL — ABNORMAL LOW (ref 0.7–4.0)
MCH: 29.7 pg (ref 26.0–34.0)
MCHC: 31.7 g/dL (ref 30.0–36.0)
MCV: 93.7 fL (ref 80.0–100.0)
Monocytes Absolute: 0.1 10*3/uL (ref 0.1–1.0)
Monocytes Relative: 2 %
Neutro Abs: 4.4 10*3/uL (ref 1.7–7.7)
Neutrophils Relative %: 88 %
Platelets: 160 10*3/uL (ref 150–400)
RBC: 3 MIL/uL — ABNORMAL LOW (ref 4.22–5.81)
RDW: 15.5 % (ref 11.5–15.5)
WBC: 5 10*3/uL (ref 4.0–10.5)
nRBC: 0 % (ref 0.0–0.2)

## 2023-01-09 LAB — URINALYSIS, W/ REFLEX TO CULTURE (INFECTION SUSPECTED)
Bacteria, UA: NONE SEEN
Bilirubin Urine: NEGATIVE
Glucose, UA: NEGATIVE mg/dL
Hgb urine dipstick: NEGATIVE
Ketones, ur: NEGATIVE mg/dL
Leukocytes,Ua: NEGATIVE
Nitrite: NEGATIVE
Protein, ur: 30 mg/dL — AB
Specific Gravity, Urine: 1.019 (ref 1.005–1.030)
pH: 5 (ref 5.0–8.0)

## 2023-01-09 LAB — COMPREHENSIVE METABOLIC PANEL
ALT: 20 U/L (ref 0–44)
AST: 26 U/L (ref 15–41)
Albumin: 2.6 g/dL — ABNORMAL LOW (ref 3.5–5.0)
Alkaline Phosphatase: 140 U/L — ABNORMAL HIGH (ref 38–126)
Anion gap: 9 (ref 5–15)
BUN: 17 mg/dL (ref 8–23)
CO2: 21 mmol/L — ABNORMAL LOW (ref 22–32)
Calcium: 7.9 mg/dL — ABNORMAL LOW (ref 8.9–10.3)
Chloride: 105 mmol/L (ref 98–111)
Creatinine, Ser: 1.1 mg/dL (ref 0.61–1.24)
GFR, Estimated: >60 mL/min (ref >60)
Glucose, Bld: 145 mg/dL — ABNORMAL HIGH (ref 70–99)
Potassium: 3.3 mmol/L — ABNORMAL LOW (ref 3.5–5.1)
Sodium: 135 mmol/L (ref 135–145)
Total Bilirubin: 1.5 mg/dL — ABNORMAL HIGH (ref 0.3–1.2)
Total Protein: 5.7 g/dL — ABNORMAL LOW (ref 6.5–8.1)

## 2023-01-09 LAB — CANCER ANTIGEN 19-9: CA 19-9: 935 U/mL — ABNORMAL HIGH (ref 0–35)

## 2023-01-09 LAB — BRAIN NATRIURETIC PEPTIDE: B Natriuretic Peptide: 282.4 pg/mL — ABNORMAL HIGH (ref 0.0–100.0)

## 2023-01-09 LAB — MAGNESIUM: Magnesium: 1.9 mg/dL (ref 1.7–2.4)

## 2023-01-09 LAB — LACTIC ACID, PLASMA
Lactic Acid, Venous: 1.5 mmol/L (ref 0.5–1.9)
Lactic Acid, Venous: 0.9 mmol/L (ref 0.5–1.9)

## 2023-01-09 LAB — PROTIME-INR
INR: 1.3 — ABNORMAL HIGH (ref 0.8–1.2)
Prothrombin Time: 16 seconds — ABNORMAL HIGH (ref 11.4–15.2)

## 2023-01-09 LAB — TROPONIN I (HIGH SENSITIVITY): Troponin I (High Sensitivity): 19 ng/L — ABNORMAL HIGH (ref <18)

## 2023-01-09 LAB — LIPASE, BLOOD: Lipase: 25 U/L (ref 11–51)

## 2023-01-09 MED ORDER — ACETAMINOPHEN 325 MG PO TABS
650.0000 mg | ORAL_TABLET | Freq: Four times a day (QID) | ORAL | Status: DC | PRN
  Administered 2023-01-10: 650 mg via ORAL
  Filled 2023-01-09: qty 2

## 2023-01-09 MED ORDER — LACTATED RINGERS IV BOLUS
250.0000 mL | Freq: Once | INTRAVENOUS | Status: AC
  Administered 2023-01-09: 250 mL via INTRAVENOUS

## 2023-01-09 MED ORDER — HEPARIN BOLUS VIA INFUSION
3000.0000 [IU] | Freq: Once | INTRAVENOUS | Status: AC
  Administered 2023-01-10: 3000 [IU] via INTRAVENOUS
  Filled 2023-01-09: qty 3000

## 2023-01-09 MED ORDER — ONDANSETRON HCL 4 MG/2ML IJ SOLN: 4.0000 mg | Freq: Four times a day (QID) | INTRAMUSCULAR | Status: DC | PRN

## 2023-01-09 MED ORDER — SODIUM CHLORIDE 0.9 % IV SOLN
2.0000 g | Freq: Two times a day (BID) | INTRAVENOUS | Status: DC
  Administered 2023-01-10 – 2023-01-13 (×7): 2 g via INTRAVENOUS
  Filled 2023-01-09 (×7): qty 12.5

## 2023-01-09 MED ORDER — VANCOMYCIN HCL IN DEXTROSE 1-5 GM/200ML-% IV SOLN
1000.0000 mg | INTRAVENOUS | Status: DC
  Administered 2023-01-10 – 2023-01-12 (×3): 1000 mg via INTRAVENOUS
  Filled 2023-01-09 (×3): qty 200

## 2023-01-09 MED ORDER — VANCOMYCIN HCL IN DEXTROSE 1-5 GM/200ML-% IV SOLN: 1000.0000 mg | Freq: Once | INTRAVENOUS | Status: DC

## 2023-01-09 MED ORDER — VANCOMYCIN HCL 2000 MG/400ML IV SOLN
2000.0000 mg | Freq: Once | INTRAVENOUS | Status: AC
  Administered 2023-01-09: 2000 mg via INTRAVENOUS
  Filled 2023-01-09: qty 400

## 2023-01-09 MED ORDER — PANTOPRAZOLE SODIUM 40 MG PO TBEC
40.0000 mg | DELAYED_RELEASE_TABLET | Freq: Every day | ORAL | Status: DC
  Administered 2023-01-10 – 2023-01-13 (×4): 40 mg via ORAL
  Filled 2023-01-09 (×4): qty 1

## 2023-01-09 MED ORDER — TAMSULOSIN HCL 0.4 MG PO CAPS
0.4000 mg | ORAL_CAPSULE | Freq: Every day | ORAL | Status: DC
  Administered 2023-01-10 – 2023-01-13 (×4): 0.4 mg via ORAL
  Filled 2023-01-09 (×4): qty 1

## 2023-01-09 MED ORDER — HEPARIN (PORCINE) 25000 UT/250ML-% IV SOLN
1400.0000 [IU]/h | INTRAVENOUS | Status: AC
  Administered 2023-01-10: 1400 [IU]/h via INTRAVENOUS
  Filled 2023-01-09: qty 250

## 2023-01-09 MED ORDER — METRONIDAZOLE 500 MG/100ML IV SOLN
500.0000 mg | Freq: Once | INTRAVENOUS | Status: AC
  Administered 2023-01-09: 500 mg via INTRAVENOUS
  Filled 2023-01-09: qty 100

## 2023-01-09 MED ORDER — LACTATED RINGERS IV SOLN: 150.0000 mL/h | INTRAVENOUS | Status: DC

## 2023-01-09 MED ORDER — SODIUM CHLORIDE 0.9 % IV SOLN: 2.0000 g | Freq: Once | INTRAVENOUS | Status: DC

## 2023-01-09 MED ORDER — LACTATED RINGERS IV BOLUS
1000.0000 mL | Freq: Once | INTRAVENOUS | Status: AC
  Administered 2023-01-09: 1000 mL via INTRAVENOUS

## 2023-01-09 MED ORDER — ACETAMINOPHEN 650 MG RE SUPP: 650.0000 mg | Freq: Four times a day (QID) | RECTAL | Status: DC | PRN

## 2023-01-09 MED ORDER — IOHEXOL 350 MG/ML SOLN
75.0000 mL | Freq: Once | INTRAVENOUS | Status: AC | PRN
  Administered 2023-01-09: 75 mL via INTRAVENOUS

## 2023-01-09 MED ORDER — OYSTER SHELL CALCIUM/D3 500-5 MG-MCG PO TABS
1.0000 | ORAL_TABLET | Freq: Every day | ORAL | Status: DC
  Administered 2023-01-10 – 2023-01-13 (×4): 1 via ORAL
  Filled 2023-01-09 (×4): qty 1

## 2023-01-09 MED ORDER — LEVALBUTEROL HCL 0.63 MG/3ML IN NEBU: 0.6300 mg | INHALATION_SOLUTION | Freq: Four times a day (QID) | RESPIRATORY_TRACT | Status: DC | PRN

## 2023-01-09 MED ORDER — ACETAMINOPHEN 325 MG PO TABS
650.0000 mg | ORAL_TABLET | Freq: Once | ORAL | Status: AC
  Administered 2023-01-09: 650 mg via ORAL
  Filled 2023-01-09: qty 2

## 2023-01-09 MED ORDER — POTASSIUM CHLORIDE CRYS ER 20 MEQ PO TBCR
40.0000 meq | EXTENDED_RELEASE_TABLET | Freq: Once | ORAL | Status: AC
  Administered 2023-01-09: 40 meq via ORAL
  Filled 2023-01-09: qty 2

## 2023-01-09 MED ORDER — FUROSEMIDE 10 MG/ML IJ SOLN: 20.0000 mg | Freq: Two times a day (BID) | INTRAMUSCULAR | Status: DC

## 2023-01-09 MED ORDER — ONDANSETRON HCL 4 MG PO TABS: 4.0000 mg | ORAL_TABLET | Freq: Four times a day (QID) | ORAL | Status: DC | PRN

## 2023-01-09 MED ORDER — SODIUM CHLORIDE 0.9 % IV SOLN
2.0000 g | Freq: Once | INTRAVENOUS | Status: AC
  Administered 2023-01-09: 2 g via INTRAVENOUS
  Filled 2023-01-09: qty 12.5

## 2023-01-09 MED ORDER — ADULT MULTIVITAMIN W/MINERALS CH
1.0000 | ORAL_TABLET | Freq: Every morning | ORAL | Status: DC
  Administered 2023-01-11 – 2023-01-13 (×3): 1 via ORAL
  Filled 2023-01-09 (×5): qty 1

## 2023-01-09 MED ORDER — FLUTTER DEVI: 1.0000 | Freq: Four times a day (QID) | Status: DC

## 2023-01-09 MED ORDER — IPRATROPIUM-ALBUTEROL 0.5-2.5 (3) MG/3ML IN SOLN
3.0000 mL | Freq: Four times a day (QID) | RESPIRATORY_TRACT | Status: DC
  Filled 2023-01-09: qty 3

## 2023-01-09 MED ORDER — ALLOPURINOL 300 MG PO TABS
300.0000 mg | ORAL_TABLET | Freq: Every day | ORAL | Status: DC
  Administered 2023-01-10 – 2023-01-13 (×4): 300 mg via ORAL
  Filled 2023-01-09 (×5): qty 1

## 2023-01-09 NOTE — ED Notes (Signed)
ED TO INPATIENT HANDOFF REPORT  ED Nurse Name and Phone #: Britt Boozer [phone]  S Name/Age/Gender Marcus [name] 82 y.o. male Room/Bed: 004C/004C  Code Status   Code Status: Full Code  Home/SNF/Other Home Patient oriented to: self, place, time, and situation Is this baseline? Yes   Triage Complete: Triage complete  Chief Complaint Sepsis [facility]) [A41.9]  Triage Note The pt was just diagnosed with pancreatic cancer this p past week his first chemotherapy was this past Friday. Elevated  temp since yesterday difficulty voiding diarrhea for 2 days   he has a porta cath  he gets chemo every 2 weeks   Allergies Allergies  Allergen Reactions   Contrast Media [Iodinated Contrast Media] Palpitations    TACHYCARDIA- patient states he has tolerated newer agents since this reaction >30 yrs ago    Level of Care/Admitting Diagnosis ED Disposition     ED Disposition  Admit   Condition  --   Comment  Hospital Area: [facility] [[id number]]  Level of Care: Telemetry Medical [104]  May admit patient to [facility] or [facility] if equivalent level of care is available:: No  Covid Evaluation: Asymptomatic - no recent exposure (last 10 days) testing not required  Diagnosis: Sepsis [facility]) [[id number]]  Admitting Physician: Tereasa Coop [[id number]]  Attending Physician: Tereasa Coop [[id number]]  Certification:: I certify this patient will need inpatient services for at least 2 midnights  Estimated Length of Stay: 5          B Medical/Surgery History Past Medical History:  Diagnosis Date   Acute meniscal tear of knee left   Arthritis    generalized   AV block 08/01/2018   Benign essential hypertension 01/18/2007   Bronchiectasis with acute exacerbation 06/23/2020   Chest pain, atypical 08/23/2014   Chronic cough 10/07/2014   Coronary artery disease    Degeneration of lumbar intervertebral disc 10/14/2017   Diverticulosis of colon 07/07/2005   Elevated PSA 06/19/2014    GERD (gastroesophageal reflux disease) 01/18/2007   Gout, unspecified 01/18/2007   Hemorrhoids 01/19/2011   Hiatal hernia    History of colonic polyps 01/18/2007   Insomnia, unspecified 08/21/2007   Iron deficiency anemia, unspecified  01/19/2011   Lumbar post-laminectomy syndrome 10/14/2017   Lumbar spondylolysis    Lumbar strain 12/14/2011   Obstructive sleep apnea 01/18/2007   uses CPAP nightly   Paraesophageal hernia    large   Primary osteoarthritis of knee 05/23/2015   Prostate cancer (HCC) 2016   S/P knee replacement 05/23/2015   Sensorineural hearing loss, bilateral    Vitamin B12 deficiency    Vitamin D deficiency 10/28/2009   Past Surgical History:  Procedure Laterality Date   BILIARY BRUSHING  12/13/2022   Procedure: BILIARY BRUSHING;  Surgeon: Iva Boop, MD;  Location: [facility] ENDOSCOPY;  Service: Gastroenterology;;   BILIARY STENT PLACEMENT  12/13/2022   Procedure: BILIARY STENT PLACEMENT;  Surgeon: Iva Boop, MD;  Location: [facility] ENDOSCOPY;  Service: Gastroenterology;;   BIOPSY  12/23/2022   Procedure: BIOPSY;  Surgeon: Lemar Lofty., MD;  Location: [facility] ENDOSCOPY;  Service: Gastroenterology;;   BRONCHIAL WASHINGS  10/30/2019   Procedure: BRONCHIAL WASHINGS;  Surgeon: [name], DO;  Location: [facility] ENDOSCOPY;  Service: Endoscopy;;   CARDIAC CATHETERIZATION     CATARACT EXTRACTION W/ INTRAOCULAR LENS  IMPLANT, BILATERAL Bilateral    COLONOSCOPY     ERCP N/A 12/13/2022   Procedure: ENDOSCOPIC RETROGRADE CHOLANGIOPANCREATOGRAPHY (ERCP);  Surgeon: Iva Boop, MD;  Location:  [facility] ENDOSCOPY;  Service: Gastroenterology;  Laterality: N/A;  with stent placement   ESOPHAGOGASTRODUODENOSCOPY     ESOPHAGOGASTRODUODENOSCOPY (EGD) WITH PROPOFOL N/A 12/23/2022   Procedure: ESOPHAGOGASTRODUODENOSCOPY (EGD) WITH PROPOFOL;  Surgeon: Meridee Score Netty Starring., MD;  Location: [facility] ENDOSCOPY;  Service: Gastroenterology;  Laterality: N/A;   EUS N/A 12/23/2022   Procedure: UPPER  ENDOSCOPIC ULTRASOUND (EUS) RADIAL;  Surgeon: Lemar Lofty., MD;  Location: [facility] ENDOSCOPY;  Service: Gastroenterology;  Laterality: N/A;   FINE NEEDLE ASPIRATION  12/23/2022   Procedure: FINE NEEDLE ASPIRATION (FNA) RADIAL;  Surgeon: Lemar Lofty., MD;  Location: [facility] ENDOSCOPY;  Service: Gastroenterology;;   IR IMAGING GUIDED PORT INSERTION  01/04/2023   KNEE ARTHROSCOPY Right 2007   KNEE ARTHROSCOPY  07/13/2011   Procedure: ARTHROSCOPY KNEE;  Surgeon: Erasmo Leventhal;  Location: [facility];  Service: Orthopedics;  Laterality: Left;  partial menisectomy with chondrylplasty   KNEE SURGERY Left    LAMINECTOMY AND MICRODISCECTOMY LUMBAR SPINE  MARCH  2008   L3 -  4   LAPAROSCOPIC INCISIONAL / UMBILICAL / VENTRAL HERNIA REPAIR  2006   PACEMAKER IMPLANT N/A 08/02/2018   Procedure: PACEMAKER IMPLANT;  Surgeon: Marinus Maw, MD;  Location: [facility] INVASIVE CV LAB;  Service: Cardiovascular;  Laterality: N/A;   PROSTATE BIOPSY     RADIOACTIVE SEED IMPLANT N/A 02/12/2019   Procedure: RADIOACTIVE SEED IMPLANT/BRACHYTHERAPY IMPLANT;  Surgeon: Marcine Matar, MD;  Location: [facility] ORS;  Service: Urology;  Laterality: N/A;  90 MINS   SHOULDER ARTHROSCOPY Right 05-23-2007   SIGMOID COLECTOMY FOR CANCER  1989   SPACE OAR INSTILLATION N/A 02/12/2019   Procedure: SPACE OAR INSTILLATION;  Surgeon: Marcine Matar, MD;  Location: [facility] ORS;  Service: Urology;  Laterality: N/A;   SPHINCTEROTOMY  12/13/2022   Procedure: SPHINCTEROTOMY;  Surgeon: Iva Boop, MD;  Location: [facility] ENDOSCOPY;  Service: Gastroenterology;;   TOTAL KNEE ARTHROPLASTY Left 05/23/2015   Procedure: LEFT TOTAL KNEE ARTHROPLASTY;  Surgeon: Eugenia Mcalpine, MD;  Location: [facility] ORS;  Service: Orthopedics;  Laterality: Left;   VERTICAL BANDED GASTROPLASTY  1986   VIDEO BRONCHOSCOPY N/A 10/30/2019   Procedure: VIDEO BRONCHOSCOPY WITH BRONICAL ALVEROLAR LAVAGE WITHOUT FLUORO;  Surgeon: [name], DO;  Location: [facility]  ENDOSCOPY;  Service: Endoscopy;  Laterality: N/A;     A IV Location/Drains/Wounds Patient Lines/Drains/Airways Status     Active Line/Drains/Airways     Name Placement date Placement time Site Days   Implanted Port 01/04/23 Right Chest 01/04/23  1534  Chest  5   Peripheral IV 01/09/23 18 G 1" Right Antecubital 01/09/23  1854  Antecubital  less than 1   Urethral Catheter Artrell Lawless, RN Non-latex;Straight-tip 16 Fr. 01/09/23  2334  Non-latex;Straight-tip  less than 1   GI Stent 12/13/22  1420  --  27            Intake/Output Last 24 hours  Intake/Output Summary (Last 24 hours) at 01/09/2023 2352 Last data filed at 01/09/2023 2252 Gross per 24 hour  Intake 750.57 ml  Output 200 ml  Net 550.57 ml    Labs/Imaging Results for orders placed or performed during the hospital encounter of 01/09/23 (from the past 48 hour(s))  Comprehensive metabolic panel     Status: Abnormal   Collection Time: 01/09/23  4:35 PM  Result Value Ref Range   Sodium 135 135 - 145 mmol/L   Potassium 3.3 (L) 3.5 - 5.1 mmol/L   Chloride 105 98 - 111 mmol/L   CO2 21 (L) 22 -  32 mmol/L   Glucose, Bld 145 (H) 70 - 99 mg/dL    Comment: Glucose reference range applies only to samples taken after fasting for at least 8 hours.   BUN 17 8 - 23 mg/dL   Creatinine, Ser 1.61 0.61 - 1.24 mg/dL   Calcium 7.9 (L) 8.9 - 10.3 mg/dL   Total Protein 5.7 (L) 6.5 - 8.1 g/dL   Albumin 2.6 (L) 3.5 - 5.0 g/dL   AST 26 15 - 41 U/L   ALT 20 0 - 44 U/L   Alkaline Phosphatase 140 (H) 38 - 126 U/L   Total Bilirubin 1.5 (H) 0.3 - 1.2 mg/dL   GFR, Estimated >09 >60 mL/min    Comment: (NOTE) Calculated using the CKD-EPI Creatinine Equation (2021)    Anion gap 9 5 - 15    Comment: Performed at [facility], [address], [place], [place] [address]  Lactic acid, plasma     Status: None   Collection Time: 01/09/23  4:35 PM  Result Value Ref Range   Lactic Acid, Venous 1.5 0.5 - 1.9 mmol/L    Comment: Performed at [facility], [address] [place], [place] [address]  CBC with Differential     Status: Abnormal   Collection Time: 01/09/23  4:35 PM  Result Value Ref Range   WBC 5.0 4.0 - 10.5 K/uL   RBC 3.00 (L) 4.22 - 5.81 MIL/uL   Hemoglobin 8.9 (L) 13.0 - 17.0 g/dL   HCT 47.8 (L) 29.5 - 62.1 %   MCV 93.7 80.0 - 100.0 fL   MCH 29.7 26.0 - 34.0 pg   MCHC 31.7 30.0 - 36.0 g/dL   RDW 30.8 65.7 - 84.6 %   Platelets 160 150 - 400 K/uL   nRBC 0.0 0.0 - 0.2 %   Neutrophils Relative % 88 %   Neutro Abs 4.4 1.7 - 7.7 K/uL   Lymphocytes Relative 10 %   Lymphs Abs 0.5 (L) 0.7 - 4.0 K/uL   Monocytes Relative 2 %   Monocytes Absolute 0.1 0.1 - 1.0 K/uL   Eosinophils Relative 0 %   Eosinophils Absolute 0.0 0.0 - 0.5 K/uL   Basophils Relative 0 %   Basophils Absolute 0.0 0.0 - 0.1 K/uL   Immature Granulocytes 0 %   Abs Immature Granulocytes 0.02 0.00 - 0.07 K/uL    Comment: Performed at [facility], [address] [place], [place] [address]  Protime-INR     Status: Abnormal   Collection Time: 01/09/23  4:35 PM  Result Value Ref Range   Prothrombin Time 16.0 (H) 11.4 - 15.2 seconds   INR 1.3 (H) 0.8 - 1.2    Comment: (NOTE) INR goal varies based on device and disease states. Performed at [facility], [address], [place], [place] [address]   Urinalysis, w/ Reflex to Culture (Infection Suspected) -Urine, Clean Catch     Status: Abnormal   Collection Time: 01/09/23  4:35 PM  Result Value Ref Range   Specimen Source URINE, CATHETERIZED    Color, Urine AMBER (A) YELLOW    Comment: BIOCHEMICALS MAY BE AFFECTED BY COLOR   APPearance HAZY (A) CLEAR   Specific Gravity, Urine 1.019 1.005 - 1.030   pH 5.0 5.0 - 8.0   Glucose, UA NEGATIVE NEGATIVE mg/dL   Hgb urine dipstick NEGATIVE NEGATIVE   Bilirubin Urine NEGATIVE NEGATIVE   Ketones, ur NEGATIVE NEGATIVE mg/dL   Protein, ur 30 (A)  NEGATIVE mg/dL   Nitrite NEGATIVE NEGATIVE   Leukocytes,Ua NEGATIVE NEGATIVE   RBC / HPF 0-5 0 -  5 RBC/hpf   WBC, UA 0-5 0 - 5 WBC/hpf    Comment:        Reflex urine culture not performed if WBC <=10, OR if Squamous epithelial cells >5. If Squamous epithelial cells >5 suggest recollection.    Bacteria, UA NONE SEEN NONE SEEN   Squamous Epithelial / HPF 0-5 0 - 5 /HPF   Mucus PRESENT     Comment: Performed at [name], [address] [place], [place] [address]  Lipase, blood     Status: None   Collection Time: 01/09/23  6:09 PM  Result Value Ref Range   Lipase 25 11 - 51 U/L    Comment: Performed at [facility], [address] [place], [place] [address]  Troponin I (High Sensitivity)     Status: Abnormal   Collection Time: 01/09/23  6:09 PM  Result Value Ref Range   Troponin I (High Sensitivity) 19 (H) <18 ng/L    Comment: (NOTE) Elevated high sensitivity troponin I (hsTnI) values and significant  changes across serial measurements may suggest ACS but many other  chronic and acute conditions are known to elevate hsTnI results.  Refer to the "Links" section for chest pain algorithms and additional  guidance. Performed at [facility], [address], [place], [place] [address]   Magnesium     Status: None   Collection Time: 01/09/23  6:09 PM  Result Value Ref Range   Magnesium 1.9 1.7 - 2.4 mg/dL    Comment: Performed at [facility], [address] [place], [place] [address]  Brain natriuretic peptide     Status: Abnormal   Collection Time: 01/09/23  6:12 PM  Result Value Ref Range   B Natriuretic Peptide 282.4 (H) 0.0 - 100.0 pg/mL    Comment: Performed at [facility], [address], [place], [place] [address]  Respiratory (~20 pathogens) panel by PCR     Status: None   Collection Time: 01/09/23  6:13 PM   Specimen: Nasopharyngeal Swab; Respiratory  Result Value Ref Range   Adenovirus NOT DETECTED NOT DETECTED   Coronavirus 229E NOT DETECTED NOT DETECTED    Comment: (NOTE) The Coronavirus on the Respiratory Panel, DOES NOT  test for the novel  Coronavirus (2019 nCoV)    Coronavirus HKU1 NOT DETECTED NOT DETECTED   Coronavirus NL63 NOT DETECTED NOT DETECTED   Coronavirus OC43 NOT DETECTED NOT DETECTED   Metapneumovirus NOT DETECTED NOT DETECTED   Rhinovirus / Enterovirus NOT DETECTED NOT DETECTED   Influenza A NOT DETECTED NOT DETECTED   Influenza B NOT DETECTED NOT DETECTED   Parainfluenza Virus 1 NOT DETECTED NOT DETECTED   Parainfluenza Virus 2 NOT DETECTED NOT DETECTED   Parainfluenza Virus 3 NOT DETECTED NOT DETECTED   Parainfluenza Virus 4 NOT DETECTED NOT DETECTED   Respiratory Syncytial Virus NOT DETECTED NOT DETECTED   Bordetella pertussis NOT DETECTED NOT DETECTED   Bordetella Parapertussis NOT DETECTED NOT DETECTED   Chlamydophila pneumoniae NOT DETECTED NOT DETECTED   Mycoplasma pneumoniae NOT DETECTED NOT DETECTED    Comment: Performed at [facility], [address], [place], [place] [address]  Lactic acid, plasma     Status: None   Collection Time: 01/09/23  6:35 PM  Result Value Ref Range   Lactic Acid, Venous 0.9 0.5 - 1.9  mmol/L    Comment: Performed at [facility], [address] [place], [place] [address]   US Abdomen Limited  Result Date: 01/09/2023 CLINICAL DATA:  Recent diagnosis of pancreatic cancer, presenting with fever. EXAM: ULTRASOUND ABDOMEN LIMITED RIGHT UPPER QUADRANT COMPARISON:  None Available. FINDINGS: Gallbladder: A moderate amount of echogenic sludge is seen within the lumen of a distended gallbladder (approximately 12.1 cm in length). No gallstones are identified. The gallbladder wall measures 5.6 mm in thickness. No sonographic Murphy sign noted by sonographer. Common bile duct: Diameter: 6.5 mm Liver: A 1.9 cm x 1.4 cm x 1.3 cm cyst is seen within the left lobe of the liver. Multiple, ill-defined hyperechoic foci are seen scattered throughout the liver parenchyma. Portal vein is patent on color Doppler imaging with normal direction of blood flow towards  the liver. Other: None. IMPRESSION: 1. Gallbladder sludge within a markedly distended gallbladder. 2. Multiple echogenic liver lesions which may represent sequelae associated with hepatic metastasis. 3. Simple left renal cyst. Electronically Signed   By: Aram Candela M.D.   On: 01/09/2023 22:25   CT Angio Chest PE W and/or Wo Contrast  Result Date: 01/09/2023 CLINICAL DATA:  PE suspected, metastatic pancreatic cancer * Tracking Code: BO * EXAM: CT ANGIOGRAPHY CHEST WITH CONTRAST TECHNIQUE: Multidetector CT imaging of the chest was performed using the standard protocol during bolus administration of intravenous contrast. Multiplanar CT image reconstructions and MIPs were obtained to evaluate the vascular anatomy. RADIATION DOSE REDUCTION: This exam was performed according to the departmental dose-optimization program which includes automated exposure control, adjustment of the mA and/or kV according to patient size and/or use of iterative reconstruction technique. CONTRAST:  75mL OMNIPAQUE IOHEXOL 350 MG/ML SOLN COMPARISON:  09/27/2019 FINDINGS: Cardiovascular: Satisfactory opacification of the pulmonary arteries to the segmental level. No evidence of pulmonary embolism. Mild cardiomegaly. Left and right coronary artery calcifications. Left chest multi lead pacer defibrillator. No pericardial effusion. Aortic atherosclerosis. Right chest port catheter. Mediastinum/Nodes: No enlarged mediastinal, hilar, or axillary lymph nodes. Thyroid gland, trachea, and esophagus demonstrate no significant findings. Lungs/Pleura: Small bilateral pleural effusions and associated atelectasis or consolidation. Acute appearing, heterogeneous airspace opacity of the superior segment left lower lobe (series 4, image 62). Upper Abdomen: Please see separately reported examination of the abdomen and pelvis Musculoskeletal: No chest wall abnormality. No acute osseous findings. Disc degenerative disease and bridging osteophytosis  throughout the thoracic spine, in keeping with DISH. Review of the MIP images confirms the above findings. IMPRESSION: 1. Negative examination for pulmonary embolism. 2. Small bilateral pleural effusions and associated atelectasis or consolidation. 3. Acute appearing, heterogeneous airspace opacity of the superior segment left lower lobe, consistent with infection or aspiration. 4. Coronary artery disease. Aortic Atherosclerosis (ICD10-I70.0). Electronically Signed   By: Jearld Lesch M.D.   On: 01/09/2023 21:09   CT ABDOMEN PELVIS W CONTRAST  Result Date: 01/09/2023 CLINICAL DATA:  Abdominal pain, pancreatic mass, * Tracking Code: BO * EXAM: CT ABDOMEN AND PELVIS WITH CONTRAST TECHNIQUE: Multidetector CT imaging of the abdomen and pelvis was performed using the standard protocol following bolus administration of intravenous contrast. RADIATION DOSE REDUCTION: This exam was performed according to the departmental dose-optimization program which includes automated exposure control, adjustment of the mA and/or kV according to patient size and/or use of iterative reconstruction technique. CONTRAST:  75mL OMNIPAQUE IOHEXOL 350 MG/ML SOLN COMPARISON:  CT abdomen pelvis, 12/03/2022 FINDINGS: Lower chest: Please see separately reported examination of the chest Hepatobiliary: Multiple low-attenuation liver lesions are unchanged,  the largest of which are clearly benign fluid attenuation cysts, others more ill-defined and incompletely characterized, although remains suspicious for small metastases. Distended gallbladder mild gallbladder wall thickening and adjacent pericholecystic fat stranding (series 5, image 43). Sludge in the gallbladder fundus (series 9, image 78). Common bile duct stent has been placed, with diminished, although persistent intrahepatic biliary ductal dilatation (series 5, image 31, series 8, image 74). Pancreas: Similar appearance of ill-defined hypodense mass in the superior pancreatic head  measuring 2.7 x 2.7 cm (series 5, image 37). Mild atrophy of the distal pancreas with prominence of the pancreatic duct measuring up to 0.5 cm, unchanged (series 5, image 35). Spleen: Normal in size without significant abnormality. Adrenals/Urinary Tract: Adrenal glands are unremarkable. Simple, benign bilateral renal cortical cysts, for which no further follow-up or characterization is required. Kidneys are normal, without renal calculi, solid lesion, or hydronephrosis. Bladder is unremarkable. Stomach/Bowel: Status post Roux type gastric bypass. Appendix appears normal. No evidence of bowel wall thickening, distention, or inflammatory changes. Status post sigmoid colon resection and reanastomosis. Vascular/Lymphatic: Aortic atherosclerosis. No enlarged abdominal or pelvic lymph nodes. Reproductive: Prostate brachytherapy. Other: Mesh repair of broad-based midline ventral hernia. Anasarca. No ascites. Musculoskeletal: No acute or significant osseous findings. IMPRESSION: 1. Distended gallbladder with mild gallbladder wall thickening and adjacent pericholecystic fat stranding. Sludge in the gallbladder fundus. Findings are concerning for acute cholecystitis. 2. Common bile duct stent has been placed, with diminished, although persistent intrahepatic biliary ductal dilatation. 3. Similar appearance of ill-defined hypodense mass in the superior pancreatic head, consistent with known pancreatic adenocarcinoma. Mild atrophy of the distal pancreas with prominence of the pancreatic duct measuring up to 0.5 cm, unchanged. 4. Multiple low-attenuation liver lesions are unchanged, the largest of which are clearly benign fluid attenuation cysts, others more ill-defined and incompletely characterized, although in keeping with small metastases. 5. Status post Roux type gastric bypass. 6. Status post sigmoid colon resection and reanastomosis. 7. Prostate brachytherapy. Aortic Atherosclerosis (ICD10-I70.0). Electronically Signed    By: Jearld Lesch M.D.   On: 01/09/2023 21:05   DG Chest 2 View  Result Date: 01/09/2023 CLINICAL DATA:  Suspected sepsis EXAM: CHEST - 2 VIEW COMPARISON:  Radiograph 11/24/2022 FINDINGS: LEFT-sided pacer overlies normal cardiac silhouette. Port in the anterior chest wall with tip in distal SVC. Elevation LEFT hemidiaphragm. No effusion, infiltrate or pneumothorax. Hyperinflated lungs. IMPRESSION: 1. No acute cardiopulmonary process. 2. Hyperinflated lungs suggest COPD. Electronically Signed   By: Genevive Bi M.D.   On: 01/09/2023 17:28    Pending Labs Unresulted Labs (From admission, onward)     Start     Ordered   01/10/23 0730  Heparin level (unfractionated)  Once-Timed,   TIMED       See Hyperspace for full Linked Orders Report.   01/09/23 2328   01/10/23 0730  APTT  Once-Timed,   TIMED       See Hyperspace for full Linked Orders Report.   01/09/23 2328   01/10/23 0500  Procalcitonin  Tomorrow morning,   R       References:    Procalcitonin Lower Respiratory Tract Infection AND Sepsis Procalcitonin Algorithm   01/09/23 2246   01/10/23 0500  Comprehensive metabolic panel  Tomorrow morning,   R        01/09/23 2246   01/10/23 0500  CBC  Tomorrow morning,   R        01/09/23 2246   01/09/23 2301  Expectorated Sputum Assessment w Gram Stain, Rflx to  Resp Cult  Once,   R       Question Answer Comment  Patient immune status Immunocompromised   Release to patient Immediate      01/09/23 2300   01/09/23 2301  Strep pneumoniae urinary antigen  Once,   R        01/09/23 2300   01/09/23 2301  Legionella Pneumophila Serogp 1 Ur Ag  Once,   R        01/09/23 2300   01/09/23 2236  Uric acid  Add-on,   AD        01/09/23 2246   01/09/23 2236  Calcium, ionized  Add-on,   AD        01/09/23 2246   01/09/23 2236  Phosphorus  Add-on,   AD        01/09/23 2246   01/09/23 2236  Magnesium  Add-on,   AD        01/09/23 2246   01/09/23 1635  Culture, blood (Routine x 2)  BLOOD CULTURE X 2,    R      01/09/23 1635            Vitals/Pain Today's Vitals   01/09/23 2215 01/09/23 2217 01/09/23 2231 01/09/23 2300  BP:  (!) 116/50  (!) 125/57  Pulse:  66  63  Resp:  (!) 23  (!) 24  Temp: 99 F (37.2 C)     TempSrc: Oral     SpO2:  96%  98%  Weight:      Height:      PainSc:   0-No pain     Isolation Precautions Droplet precaution  Medications Medications  ceFEPIme (MAXIPIME) 2 g in sodium chloride 0.9 % 100 mL IVPB (has no administration in time range)  vancomycin (VANCOCIN) IVPB 1000 mg/200 mL premix (has no administration in time range)  allopurinol (ZYLOPRIM) tablet 300 mg (has no administration in time range)  pantoprazole (PROTONIX) EC tablet 40 mg (has no administration in time range)  tamsulosin (FLOMAX) capsule 0.4 mg (has no administration in time range)  calcium-vitamin D (OSCAL WITH D) 500-5 MG-MCG per tablet 1 tablet (has no administration in time range)  multivitamin with minerals tablet 1 tablet (has no administration in time range)  acetaminophen (TYLENOL) tablet 650 mg (has no administration in time range)    Or  acetaminophen (TYLENOL) suppository 650 mg (has no administration in time range)  ondansetron (ZOFRAN) tablet 4 mg (has no administration in time range)    Or  ondansetron (ZOFRAN) injection 4 mg (has no administration in time range)  levalbuterol (XOPENEX) nebulizer solution 0.63 mg (has no administration in time range)  ipratropium-albuterol (DUONEB) 0.5-2.5 (3) MG/3ML nebulizer solution 3 mL (has no administration in time range)  furosemide (LASIX) injection 20 mg (has no administration in time range)  heparin bolus via infusion 3,000 Units (has no administration in time range)  heparin ADULT infusion 100 units/mL (25000 units/243mL) (has no administration in time range)  acetaminophen (TYLENOL) tablet 650 mg (650 mg Oral Given 01/09/23 1655)  ceFEPIme (MAXIPIME) 2 g in sodium chloride 0.9 % 100 mL IVPB (0 g Intravenous Stopped 01/09/23  2116)  metroNIDAZOLE (FLAGYL) IVPB 500 mg (0 mg Intravenous Stopped 01/09/23 2117)  potassium chloride SA (KLOR-CON M) CR tablet 40 mEq (40 mEq Oral Given 01/09/23 1910)  lactated ringers bolus 250 mL (0 mLs Intravenous Stopped 01/09/23 1945)  vancomycin (VANCOREADY) IVPB 2000 mg/400 mL (0 mg Intravenous Stopped 01/09/23 2252)  iohexol (OMNIPAQUE) 350  MG/ML injection 75 mL (75 mLs Intravenous Contrast Given 01/09/23 2052)  lactated ringers bolus 1,000 mL (0 mLs Intravenous Stopped 01/09/23 2315)    Mobility walks with device      R Recommendations: See Admitting Provider Note  Report given to:   Additional Notes: first chemo last week, temp x1 day, temp here, sepsis workup done, strict I&Os, has foley now, A&Ox4, daughter at bedside, accessing PAC now, IV RFA, will be getting heparin gtt

## 2023-01-09 NOTE — H&P (Incomplete)
History and Physical    Marcus [name] ZOX:[id number] DOB: [birth year] DOA: 01/09/2023  PCP: [name], NP   Patient coming from: {Blank single:19197::"Home","Clinic","SNF","ALF","Group Home","Hospice","***"}   Chief Complaint:  Chief Complaint  Patient presents with   Fever   Chemotherapy    HPI:  This is a 82 year old man medical history of prostate cancer s/p radioactive seed implant in 2020, history of colon cancer status post right colectomy in 1989, COPD, hypertension, CAD, atrial fibrillation (not currently on Eliquis since last hospital discharge from 12/15/2022 for EGD and EUS biopsy), arthritis, gout, GERD, OSA, hyperlipidemia, chronic biliary ductal obstruction s/p stent placement 6/112/2024 due to malignant neoplasm, , recently diagnosed with pancreatic cancer 2 weeks ago and received first session of chemotherapy on 01/07/2023 presented with emergency department complaining of generalized weakness, lower extremity edema, and exertional shortness of breath.  He also had some mild diarrhea and difficulty urination.  Today earlier in the day his symptom has been gotten worse and developed some chills.  He came to ED for evaluation.  Patient reported he is having progressing shortness of breath and worsening bilateral lower extremity swelling over the course of last 2 weeks.  Has dyspnea, orthopnea denies any PND.  Denies any chest pain, palpitation.  Patient also having productive cough and low-grade fever at home.  Denies any abdominal pain, nausea, vomiting and diarrhea.  All this symptom has been a started and got worse from chemotherapy on 01/08/2023.   Per chart review, patient recently had EGD by Dr. Meridee Score on 12/23/2022 revealed esophageal ulcers with no bleeding and esophagitis with no bleeding at the GE junction.  A mass was identified in the pancreas head, staged as T2 N0 by ultrasound criteria.  There is invasion of the SMV.  An FNA of the mass was performed.  No  malignant appearing lymph nodes.  4 cystic lesions were noted in the liver.   Afterward patient has been evaluated by Dr. Dr. Burnett Harry on 12/29/2022.  With the evidence of pancreatic cancer and patient being started on chemotherapy first therapy was on 01/07/2023.  Per chart review patient has been recently admitted on 12/10/2022 to 12/15/2022, for the evaluation of jaundice, found to have pancreatic mass, liver lesion elevated liver enzymes, hyperbilirubinemia and elevated CA 19-9.  CT on 5/31 showed 2 cm pancreatic mass within the proximal body, biliary pancreatic ductal dilation, and multiple liver lesion concerning for malignancy.  12/13/2022 underwent ERCP showed single localized biliary stricture in the CBD concerning for malignancy that was aspirated sent for cytology the stricture was treated with a stent placement.  Patient was initially recommended to consider liver biopsy although per my discussion with IR on 6/12 there is no good target lesion for biopsy in the liver despite multiple mass. Further discussion with the GI was had and recommendation was consideration of ERCP with EUS.  He was discharged to follow-up outpatient GI and oncology.  Also around the same time warfarin has been transitioned to Eliquis.  Cardiology evaluated patient and recommended patient can be safely off anticoagulation for the duration Epaned post ERCP and patient reported currently he is not taking any Eliquis.  However per chart review of hospital discharge note from 12/15/2022 patient was discharged home on Eliquis 5 mg to take twice daily.  ED Course:  At ED initial presentation patient found febrile 102.9 F, heart rate 84, respiratory 20, blood pressure 129/89. EKG shows sinus rhythm with premature ventricular complex. CMP showed low potassium 3.3, low bicarb 21, albumin  2.6, elevated ANA alkaline phosphatase 140 and bilirubin 1.5. Lactic within normal is 1.5. CBC WBC 5, hemoglobin 8.9, hematocrit 28 and platelet  160. Elevated PT 16 and INR 1.3. UA unremarkable. Blood culture x 2 process. Elevated troponin 19.  BNP elevated to 242.  Chest x-ray no acute cardiopulmonary process and hyperinflation of black suggestive COPD.  CTA rule out PE.  Small bilateral pleural effusion and associate atelectasis or consolidation.  Acute appearing heterogenous airspace opacity superior segment of the left lobe consistent with infection or aspiration.  CT abdomen pelvis showed distended gallbladder with mild gallbladder wall thickening and adjacent pericholecystic fat stranding, sludge in the gallbladder fundus finding are concerning for acute cholecystitis.  Common bile duct stent with diminished although persistent intrahepatic duct dilation.  Similar appearance of ill-defined hypodense mass in the superior pancreatic head consistent with known pancreatic adenocarcinoma.  ED physician, has been reached out to on-call oncologist, Dr. Bertis Ruddy,  and discussed lab and imaging findings as well as pertinent plan - they recommend: Although this may be secondary to "tumor fever", after initiating hemotherapy, patient to be treated empirically for infection pending culture results Review of Systems:  ROS  Past Medical History:  Diagnosis Date   Acute meniscal tear of knee left   Arthritis    generalized   AV block 08/01/2018   Benign essential hypertension 01/18/2007   Bronchiectasis with acute exacerbation 06/23/2020   Chest pain, atypical 08/23/2014   Chronic cough 10/07/2014   Coronary artery disease    Degeneration of lumbar intervertebral disc 10/14/2017   Diverticulosis of colon 07/07/2005   Elevated PSA 06/19/2014   GERD (gastroesophageal reflux disease) 01/18/2007   Gout, unspecified 01/18/2007   Hemorrhoids 01/19/2011   Hiatal hernia    History of colonic polyps 01/18/2007   Insomnia, unspecified 08/21/2007   Iron deficiency anemia, unspecified  01/19/2011   Lumbar post-laminectomy syndrome 10/14/2017    Lumbar spondylolysis    Lumbar strain 12/14/2011   Obstructive sleep apnea 01/18/2007   uses CPAP nightly   Paraesophageal hernia    large   Primary osteoarthritis of knee 05/23/2015   Prostate cancer (HCC) 2016   S/P knee replacement 05/23/2015   Sensorineural hearing loss, bilateral    Vitamin B12 deficiency    Vitamin D deficiency 10/28/2009    Past Surgical History:  Procedure Laterality Date   BILIARY BRUSHING  12/13/2022   Procedure: BILIARY BRUSHING;  Surgeon: Iva Boop, MD;  Location: [facility] ENDOSCOPY;  Service: Gastroenterology;;   BILIARY STENT PLACEMENT  12/13/2022   Procedure: BILIARY STENT PLACEMENT;  Surgeon: Iva Boop, MD;  Location: [facility] ENDOSCOPY;  Service: Gastroenterology;;   BIOPSY  12/23/2022   Procedure: BIOPSY;  Surgeon: Lemar Lofty., MD;  Location: [facility] ENDOSCOPY;  Service: Gastroenterology;;   BRONCHIAL WASHINGS  10/30/2019   Procedure: BRONCHIAL WASHINGS;  Surgeon: [name], DO;  Location: [facility] ENDOSCOPY;  Service: Endoscopy;;   CARDIAC CATHETERIZATION     CATARACT EXTRACTION W/ INTRAOCULAR LENS  IMPLANT, BILATERAL Bilateral    COLONOSCOPY     ERCP N/A 12/13/2022   Procedure: ENDOSCOPIC RETROGRADE CHOLANGIOPANCREATOGRAPHY (ERCP);  Surgeon: Iva Boop, MD;  Location: [facility] ENDOSCOPY;  Service: Gastroenterology;  Laterality: N/A;  with stent placement   ESOPHAGOGASTRODUODENOSCOPY     ESOPHAGOGASTRODUODENOSCOPY (EGD) WITH PROPOFOL N/A 12/23/2022   Procedure: ESOPHAGOGASTRODUODENOSCOPY (EGD) WITH PROPOFOL;  Surgeon: Meridee Score Netty Starring., MD;  Location: [facility] ENDOSCOPY;  Service: Gastroenterology;  Laterality: N/A;   EUS N/A 12/23/2022   Procedure: UPPER ENDOSCOPIC ULTRASOUND (  EUS) RADIAL;  Surgeon: Meridee Score Netty Starring., MD;  Location: [facility] ENDOSCOPY;  Service: Gastroenterology;  Laterality: N/A;   FINE NEEDLE ASPIRATION  12/23/2022   Procedure: FINE NEEDLE ASPIRATION (FNA) RADIAL;  Surgeon: Lemar Lofty., MD;  Location: [facility] ENDOSCOPY;   Service: Gastroenterology;;   IR IMAGING GUIDED PORT INSERTION  01/04/2023   KNEE ARTHROSCOPY Right 2007   KNEE ARTHROSCOPY  07/13/2011   Procedure: ARTHROSCOPY KNEE;  Surgeon: Erasmo Leventhal;  Location: [facility];  Service: Orthopedics;  Laterality: Left;  partial menisectomy with chondrylplasty   KNEE SURGERY Left    LAMINECTOMY AND MICRODISCECTOMY LUMBAR SPINE  MARCH  2008   L3 -  4   LAPAROSCOPIC INCISIONAL / UMBILICAL / VENTRAL HERNIA REPAIR  2006   PACEMAKER IMPLANT N/A 08/02/2018   Procedure: PACEMAKER IMPLANT;  Surgeon: Marinus Maw, MD;  Location: [facility] INVASIVE CV LAB;  Service: Cardiovascular;  Laterality: N/A;   PROSTATE BIOPSY     RADIOACTIVE SEED IMPLANT N/A 02/12/2019   Procedure: RADIOACTIVE SEED IMPLANT/BRACHYTHERAPY IMPLANT;  Surgeon: Marcine Matar, MD;  Location: [facility] ORS;  Service: Urology;  Laterality: N/A;  90 MINS   SHOULDER ARTHROSCOPY Right 05-23-2007   SIGMOID COLECTOMY FOR CANCER  1989   SPACE OAR INSTILLATION N/A 02/12/2019   Procedure: SPACE OAR INSTILLATION;  Surgeon: Marcine Matar, MD;  Location: [facility] ORS;  Service: Urology;  Laterality: N/A;   SPHINCTEROTOMY  12/13/2022   Procedure: SPHINCTEROTOMY;  Surgeon: Iva Boop, MD;  Location: [facility] ENDOSCOPY;  Service: Gastroenterology;;   TOTAL KNEE ARTHROPLASTY Left 05/23/2015   Procedure: LEFT TOTAL KNEE ARTHROPLASTY;  Surgeon: Eugenia Mcalpine, MD;  Location: [facility] ORS;  Service: Orthopedics;  Laterality: Left;   VERTICAL BANDED GASTROPLASTY  1986   VIDEO BRONCHOSCOPY N/A 10/30/2019   Procedure: VIDEO BRONCHOSCOPY WITH BRONICAL ALVEROLAR LAVAGE WITHOUT FLUORO;  Surgeon: [name], DO;  Location: [facility] ENDOSCOPY;  Service: Endoscopy;  Laterality: N/A;     reports that he quit smoking about 48 years ago. His smoking use included cigarettes. He has a 40.00 pack-year smoking history. He has never used smokeless tobacco. He reports that he does not currently use alcohol after a past usage of  about 30.0 standard drinks of alcohol per week. He reports that he does not use drugs.  Allergies  Allergen Reactions   Contrast Media [Iodinated Contrast Media] Palpitations    TACHYCARDIA- patient states he has tolerated newer agents since this reaction >30 yrs ago    Family History  Problem Relation Age of Onset   Stroke Paternal Grandfather    Heart disease Paternal Grandfather    Esophagitis Father        died from perforated esophagus   Breast cancer Mother    Colon cancer Maternal Grandmother        questionable   Esophageal cancer Neg Hx    Prostate cancer Neg Hx    Stomach cancer Neg Hx     Prior to Admission medications   Medication Sig Start Date End Date Taking? Authorizing Provider  allopurinol (ZYLOPRIM) 300 MG tablet Take 1 tablet (300 mg total) by mouth daily. 05/25/22   Nafziger, Kandee Keen, NP  amLODipine (NORVASC) 10 MG tablet Take 1 tablet (10 mg total) by mouth daily. 05/25/22   Nafziger, Kandee Keen, NP  apixaban (ELIQUIS) 5 MG TABS tablet Take 1 tablet (5 mg total) by mouth 2 (two) times daily. 12/25/22   Mansouraty, Netty Starring., MD  Calcium Carb-Cholecalciferol (CALCIUM 600 + D PO) Take 1 tablet by  mouth daily. 800 units of vitamin D    [provider]  camphor-menthol (SARNA) lotion Apply topically as needed for itching. 12/15/22   Rolly Salter, MD  esomeprazole (NEXIUM) 40 MG capsule Take 1 capsule (40 mg total) by mouth 2 (two) times daily before a meal. 12/23/22 12/23/23  Mansouraty, Netty Starring., MD  hydrOXYzine (ATARAX) 50 MG tablet Take 1 tablet (50 mg total) by mouth 3 (three) times daily as needed. 12/23/22 03/23/23  Nafziger, Kandee Keen, NP  lidocaine-prilocaine (EMLA) cream Apply 1 Application topically as needed. 12/31/22   [name], MD  Multiple Vitamin (MULTIVITAMIN) tablet Take 1 tablet by mouth in the morning.    [provider]  ondansetron (ZOFRAN) 8 MG tablet Take 1 tablet (8 mg total) by mouth every 8 (eight) hours as needed. 12/31/22    [name], MD  polyethylene glycol powder (GLYCOLAX/MIRALAX) 17 GM/SCOOP powder Take 17g (1 capful) by mouth as directed daily as needed for mild constipation. 12/15/22   Rolly Salter, MD  prochlorperazine (COMPAZINE) 10 MG tablet Take 1 tablet (10 mg total) by mouth every 6 (six) hours as needed for nausea or vomiting. 12/31/22   [name], MD  Respiratory Therapy Supplies (FLUTTER) DEVI 1 each by Does not apply route in the morning and at bedtime. 10/11/19   Karie Fetch P, DO  sodium chloride HYPERTONIC 3 % nebulizer solution Take by nebulization as needed for other. 04/01/22   [name], NP  sucralfate (CARAFATE) 1 GM/10ML suspension Take 10 mLs (1 g total) by mouth 2 (two) times daily. 12/23/22 12/23/23  Mansouraty, Netty Starring., MD  tamsulosin (FLOMAX) 0.4 MG CAPS capsule Take 0.4 mg by mouth daily. 03/04/16   [provider]     Physical Exam: Vitals:   01/09/23 1914 01/09/23 1945 01/09/23 2215 01/09/23 2217  BP:  (!) 101/56  (!) 116/50  Pulse:  72  66  Resp:  13  (!) 23  Temp: 100.2 F (37.9 C)  99 F (37.2 C)   TempSrc: Oral  Oral   SpO2:  99%  96%  Weight:      Height:        Physical Exam   Labs on Admission: I have personally reviewed following labs and imaging studies  CBC: Recent Labs  Lab 01/07/23 1159 01/09/23 1635  WBC 4.7 5.0  NEUTROABS 3.0 4.4  HGB 9.0* 8.9*  HCT 27.7* 28.1*  MCV 93.3 93.7  PLT 210 160   Basic Metabolic Panel: Recent Labs  Lab 01/07/23 1159 01/09/23 1635 01/09/23 1809  NA 142 135  --   K 3.6 3.3*  --   CL 111 105  --   CO2 23 21*  --   GLUCOSE 91 145*  --   BUN 21 17  --   CREATININE 0.85 1.10  --   CALCIUM 9.0 7.9*  --   MG  --   --  1.9   GFR: Estimated Creatinine Clearance: 58.3 mL/min (by C-G formula based on SCr of 1.1 mg/dL). Liver Function Tests: Recent Labs  Lab 01/07/23 1159 01/09/23 1635  AST 18 26  ALT 21 20  ALKPHOS 168* 140*  BILITOT 1.2 1.5*  PROT 5.8* 5.7*  ALBUMIN 3.5  2.6*   Recent Labs  Lab 01/09/23 1809  LIPASE 25   No results for input(s): "AMMONIA" in the last 168 hours. Coagulation Profile: Recent Labs  Lab 01/09/23 1635  INR 1.3*   Cardiac Enzymes: Recent Labs  Lab 01/09/23 1809  TROPONINIHS 19*   BNP (last 3 results) Recent Labs    01/09/23 1812  BNP 282.4*   HbA1C: No results for input(s): "HGBA1C" in the last 72 hours. CBG: No results for input(s): "GLUCAP" in the last 168 hours. Lipid Profile: No results for input(s): "CHOL", "HDL", "LDLCALC", "TRIG", "CHOLHDL", "LDLDIRECT" in the last 72 hours. Thyroid Function Tests: No results for input(s): "TSH", "T4TOTAL", "FREET4", "T3FREE", "THYROIDAB" in the last 72 hours. Anemia Panel: No results for input(s): "VITAMINB12", "FOLATE", "FERRITIN", "TIBC", "IRON", "RETICCTPCT" in the last 72 hours. Urine analysis:    Component Value Date/Time   COLORURINE AMBER (A) 01/09/2023 1635   APPEARANCEUR HAZY (A) 01/09/2023 1635   LABSPEC 1.019 01/09/2023 1635   PHURINE 5.0 01/09/2023 1635   GLUCOSEU NEGATIVE 01/09/2023 1635   HGBUR NEGATIVE 01/09/2023 1635   BILIRUBINUR NEGATIVE 01/09/2023 1635   BILIRUBINUR n 10/10/2015 0919   KETONESUR NEGATIVE 01/09/2023 1635   PROTEINUR 30 (A) 01/09/2023 1635   UROBILINOGEN 0.2 10/10/2015 0919   UROBILINOGEN 0.2 05/13/2015 0922   NITRITE NEGATIVE 01/09/2023 1635   LEUKOCYTESUR NEGATIVE 01/09/2023 1635    Radiological Exams on Admission: I have personally reviewed images US Abdomen Limited  Result Date: 01/09/2023 CLINICAL DATA:  Recent diagnosis of pancreatic cancer, presenting with fever. EXAM: ULTRASOUND ABDOMEN LIMITED RIGHT UPPER QUADRANT COMPARISON:  None Available. FINDINGS: Gallbladder: A moderate amount of echogenic sludge is seen within the lumen of a distended gallbladder (approximately 12.1 cm in length). No gallstones are identified. The gallbladder wall measures 5.6 mm in thickness. No sonographic Murphy sign noted by sonographer.  Common bile duct: Diameter: 6.5 mm Liver: A 1.9 cm x 1.4 cm x 1.3 cm cyst is seen within the left lobe of the liver. Multiple, ill-defined hyperechoic foci are seen scattered throughout the liver parenchyma. Portal vein is patent on color Doppler imaging with normal direction of blood flow towards the liver. Other: None. IMPRESSION: 1. Gallbladder sludge within a markedly distended gallbladder. 2. Multiple echogenic liver lesions which may represent sequelae associated with hepatic metastasis. 3. Simple left renal cyst. Electronically Signed   By: Aram Candela M.D.   On: 01/09/2023 22:25   CT Angio Chest PE W and/or Wo Contrast  Result Date: 01/09/2023 CLINICAL DATA:  PE suspected, metastatic pancreatic cancer * Tracking Code: BO * EXAM: CT ANGIOGRAPHY CHEST WITH CONTRAST TECHNIQUE: Multidetector CT imaging of the chest was performed using the standard protocol during bolus administration of intravenous contrast. Multiplanar CT image reconstructions and MIPs were obtained to evaluate the vascular anatomy. RADIATION DOSE REDUCTION: This exam was performed according to the departmental dose-optimization program which includes automated exposure control, adjustment of the mA and/or kV according to patient size and/or use of iterative reconstruction technique. CONTRAST:  75mL OMNIPAQUE IOHEXOL 350 MG/ML SOLN COMPARISON:  09/27/2019 FINDINGS: Cardiovascular: Satisfactory opacification of the pulmonary arteries to the segmental level. No evidence of pulmonary embolism. Mild cardiomegaly. Left and right coronary artery calcifications. Left chest multi lead pacer defibrillator. No pericardial effusion. Aortic atherosclerosis. Right chest port catheter. Mediastinum/Nodes: No enlarged mediastinal, hilar, or axillary lymph nodes. Thyroid gland, trachea, and esophagus demonstrate no significant findings. Lungs/Pleura: Small bilateral pleural effusions and associated atelectasis or consolidation. Acute appearing,  heterogeneous airspace opacity of the superior segment left lower lobe (series 4, image 62). Upper Abdomen: Please see separately reported examination of the abdomen and pelvis Musculoskeletal: No chest wall abnormality. No acute osseous findings. Disc degenerative disease and bridging osteophytosis throughout the thoracic spine, in keeping  with DISH. Review of the MIP images confirms the above findings. IMPRESSION: 1. Negative examination for pulmonary embolism. 2. Small bilateral pleural effusions and associated atelectasis or consolidation. 3. Acute appearing, heterogeneous airspace opacity of the superior segment left lower lobe, consistent with infection or aspiration. 4. Coronary artery disease. Aortic Atherosclerosis (ICD10-I70.0). Electronically Signed   By: Jearld Lesch M.D.   On: 01/09/2023 21:09   CT ABDOMEN PELVIS W CONTRAST  Result Date: 01/09/2023 CLINICAL DATA:  Abdominal pain, pancreatic mass, * Tracking Code: BO * EXAM: CT ABDOMEN AND PELVIS WITH CONTRAST TECHNIQUE: Multidetector CT imaging of the abdomen and pelvis was performed using the standard protocol following bolus administration of intravenous contrast. RADIATION DOSE REDUCTION: This exam was performed according to the departmental dose-optimization program which includes automated exposure control, adjustment of the mA and/or kV according to patient size and/or use of iterative reconstruction technique. CONTRAST:  75mL OMNIPAQUE IOHEXOL 350 MG/ML SOLN COMPARISON:  CT abdomen pelvis, 12/03/2022 FINDINGS: Lower chest: Please see separately reported examination of the chest Hepatobiliary: Multiple low-attenuation liver lesions are unchanged, the largest of which are clearly benign fluid attenuation cysts, others more ill-defined and incompletely characterized, although remains suspicious for small metastases. Distended gallbladder mild gallbladder wall thickening and adjacent pericholecystic fat stranding (series 5, image 43). Sludge in  the gallbladder fundus (series 9, image 78). Common bile duct stent has been placed, with diminished, although persistent intrahepatic biliary ductal dilatation (series 5, image 31, series 8, image 74). Pancreas: Similar appearance of ill-defined hypodense mass in the superior pancreatic head measuring 2.7 x 2.7 cm (series 5, image 37). Mild atrophy of the distal pancreas with prominence of the pancreatic duct measuring up to 0.5 cm, unchanged (series 5, image 35). Spleen: Normal in size without significant abnormality. Adrenals/Urinary Tract: Adrenal glands are unremarkable. Simple, benign bilateral renal cortical cysts, for which no further follow-up or characterization is required. Kidneys are normal, without renal calculi, solid lesion, or hydronephrosis. Bladder is unremarkable. Stomach/Bowel: Status post Roux type gastric bypass. Appendix appears normal. No evidence of bowel wall thickening, distention, or inflammatory changes. Status post sigmoid colon resection and reanastomosis. Vascular/Lymphatic: Aortic atherosclerosis. No enlarged abdominal or pelvic lymph nodes. Reproductive: Prostate brachytherapy. Other: Mesh repair of broad-based midline ventral hernia. Anasarca. No ascites. Musculoskeletal: No acute or significant osseous findings. IMPRESSION: 1. Distended gallbladder with mild gallbladder wall thickening and adjacent pericholecystic fat stranding. Sludge in the gallbladder fundus. Findings are concerning for acute cholecystitis. 2. Common bile duct stent has been placed, with diminished, although persistent intrahepatic biliary ductal dilatation. 3. Similar appearance of ill-defined hypodense mass in the superior pancreatic head, consistent with known pancreatic adenocarcinoma. Mild atrophy of the distal pancreas with prominence of the pancreatic duct measuring up to 0.5 cm, unchanged. 4. Multiple low-attenuation liver lesions are unchanged, the largest of which are clearly benign fluid attenuation  cysts, others more ill-defined and incompletely characterized, although in keeping with small metastases. 5. Status post Roux type gastric bypass. 6. Status post sigmoid colon resection and reanastomosis. 7. Prostate brachytherapy. Aortic Atherosclerosis (ICD10-I70.0). Electronically Signed   By: Jearld Lesch M.D.   On: 01/09/2023 21:05   DG Chest 2 View  Result Date: 01/09/2023 CLINICAL DATA:  Suspected sepsis EXAM: CHEST - 2 VIEW COMPARISON:  Radiograph 11/24/2022 FINDINGS: LEFT-sided pacer overlies normal cardiac silhouette. Port in the anterior chest wall with tip in distal SVC. Elevation LEFT hemidiaphragm. No effusion, infiltrate or pneumothorax. Hyperinflated lungs. IMPRESSION: 1. No acute cardiopulmonary process. 2. Hyperinflated lungs suggest COPD.  Electronically Signed   By: Genevive Bi M.D.   On: 01/09/2023 17:28    EKG: My personal interpretation of EKG shows: ***    Assessment/Plan: Principal Problem:   Sepsis (HCC) Active Problems:   Pancreatic adenocarcinoma (HCC)   Gout   CAD (coronary artery disease)   Atrial fibrillation, chronic (HCC)   CAP (community acquired pneumonia) due to Pneumococcus (HCC)   CHF exacerbation (HCC)   Combined systolic and diastolic heart failure (HCC)   COPD (chronic obstructive pulmonary disease) (HCC)   Acute cholecystitis   Hypokalemia   History of biliary duct stent placement    Assessment and Plan: No notes have been filed under this hospital service. Service: Hospitalist  GI Dr. Olena Heckle recommendation : please hold the Eliquis - if you feel the need could use heparin - I think he may need an ERCP and stent change,. From what I see I suppose cholecystitis is possible though not clear. We will see him in AM. In case it is possible to schedule a procedure hold NPO except meds in AM and would stop heparin at 0800 if used overnight     DVT prophylaxis: {Blank single:19197::"Lovenox","SQ Heparin","IV heparin  gtts","Xarelto","Eliquis","Coumadin","SCDs","***"} Code Status: {Blank single:19197::"Full Code","DNR with Intubation","DNR/DNI(Do NOT Intubate)","Comfort Care","***"} Diet:   Family Communication:  ***  Disposition Plan:  ***  Consults:  ***  Admission status: {Blank single:19197::"Observation","Inpatient"}, {Blank single:19197::"Med-Surg","Telemetry bed","Step Down Unit"}  Severity of Illness: {Observation/Inpatient:21159}    Tereasa Coop MD [facility]  How to contact the [facility] Attending or Consulting provider 7A - 7P or covering provider during after hours 7P -7A, for this patient?   Check the care team in [facility] and look for a) attending/consulting [facility] provider listed and b) the [facility] team listed Log into [email or web address] and use [place]'s universal password to access. If you do not have the password, please contact the hospital operator. Locate the [facility] provider you are looking for under [facility] and page to a number that you can be directly reached. If you still have difficulty reaching the provider, please page the [facility] (Director on Call) for the Hospitalists listed on amion for assistance.  01/09/2023, 10:53 PM

## 2023-01-09 NOTE — Progress Notes (Signed)
ANTICOAGULATION CONSULT NOTE - Initial Consult  Pharmacy Consult for heparin Indication: atrial fibrillation  Allergies  Allergen Reactions   Contrast Media [Iodinated Contrast Media] Palpitations    TACHYCARDIA- patient states he has tolerated newer agents since this reaction >30 yrs ago    Patient Measurements: Height: 5\' 8"  (172.7 cm) Weight: 100 kg (220 lb 7.4 oz) IBW/kg (Calculated) : 68.4 Heparin Dosing Weight: 90kg  Vital Signs: Temp: 99 F (37.2 C) (07/07 2215) Temp Source: Oral (07/07 2215) BP: 116/50 (07/07 2217) Pulse Rate: 66 (07/07 2217)  Labs: Recent Labs    01/07/23 1159 01/09/23 1635 01/09/23 1809  HGB 9.0* 8.9*  --   HCT 27.7* 28.1*  --   PLT 210 160  --   LABPROT  --  16.0*  --   INR  --  1.3*  --   CREATININE 0.85 1.10  --   TROPONINIHS  --   --  19*    Estimated Creatinine Clearance: 58.3 mL/min (by C-G formula based on SCr of 1.1 mg/dL).   Medical History: Past Medical History:  Diagnosis Date   Acute meniscal tear of knee left   Arthritis    generalized   AV block 08/01/2018   Benign essential hypertension 01/18/2007   Bronchiectasis with acute exacerbation 06/23/2020   Chest pain, atypical 08/23/2014   Chronic cough 10/07/2014   Coronary artery disease    Degeneration of lumbar intervertebral disc 10/14/2017   Diverticulosis of colon 07/07/2005   Elevated PSA 06/19/2014   GERD (gastroesophageal reflux disease) 01/18/2007   Gout, unspecified 01/18/2007   Hemorrhoids 01/19/2011   Hiatal hernia    History of colonic polyps 01/18/2007   Insomnia, unspecified 08/21/2007   Iron deficiency anemia, unspecified  01/19/2011   Lumbar post-laminectomy syndrome 10/14/2017   Lumbar spondylolysis    Lumbar strain 12/14/2011   Obstructive sleep apnea 01/18/2007   uses CPAP nightly   Paraesophageal hernia    large   Primary osteoarthritis of knee 05/23/2015   Prostate cancer (HCC) 2016   S/P knee replacement 05/23/2015   Sensorineural  hearing loss, bilateral    Vitamin B12 deficiency    Vitamin D deficiency 10/28/2009    Assessment: 82yo male had first chemotherapy on Friday and now c/o fever, diarrhea, and urinary retention >> admitted for concern for sepsis; plan for ERCP on 7/8 and to start heparin for Afib overnight; of note pt is on apixaban PTA for Afib but pt states that he was never told to resume it after held for procedure 6/6.  Goal of Therapy:  Heparin level 0.3-0.7 units/ml Monitor platelets by anticoagulation protocol: Yes   Plan:  Heparin 3000 units IV bolus x1 followed by infusion at 1400 units/hr (hold at 8a for ERCP). Monitor heparin levels and CBC.  Vernard Gambles, PharmD, BCPS  01/09/2023,11:15 PM

## 2023-01-09 NOTE — ED Notes (Signed)
ED Provider at bedside. 

## 2023-01-09 NOTE — ED Triage Notes (Signed)
The pt was just diagnosed with pancreatic cancer this p past week his first chemotherapy was this past Friday. Elevated  temp since yesterday difficulty voiding diarrhea for 2 days   he has a porta cath  he gets chemo every 2 weeks

## 2023-01-09 NOTE — ED Notes (Signed)
Admitting at bedside-Monique,RN  

## 2023-01-09 NOTE — ED Provider Notes (Signed)
Care of patient received from prior provider at 9:23 PM, please see their note for complete H/P and care plan.  Received handoff per ED course.  Clinical Course as of 01/09/23 2123  Wynelle Link Jan 09, 2023  2122 Stable. Fever 102.9  Pancreatic cancer. CTA nondiagnostic Started chemo Friday, white count ok  CTA with ?PNA [CC]    Clinical Course User Index [CC] Glyn Ade, MD    Reassessment: I assessed at bedside.  Patient is having dyspnea on exertion and worsening shortness of breath per family.  Notably he is having no abdominal pain. CT scan of his lungs demonstrated no pneumonia.  He has a history of pneumonia in this location and worse given worsening dyspnea this is the most likely diagnosis.  Discussed with hospitalist for admission.  CRITICAL CARE Performed by: Glyn Ade  ?  Total critical care time: 30 minutes sepsis  Critical care time was exclusive of separately billable procedures and treating other patients.  Critical care was necessary to treat or prevent imminent or life-threatening deterioration.  Critical care was time spent personally by me on the following activities: development of treatment plan with patient and/or surrogate as well as nursing, discussions with consultants, evaluation of patient's response to treatment, examination of patient, obtaining history from patient or surrogate, ordering and performing treatments and interventions, ordering and review of laboratory studies, ordering and review of radiographic studies, pulse oximetry and re-evaluation of patient's condition.    Glyn Ade, MD 01/09/23 2141

## 2023-01-09 NOTE — ED Notes (Signed)
[place] at [employer]

## 2023-01-09 NOTE — Progress Notes (Signed)
Pharmacy Antibiotic Note  Marcus [name] is a 82 y.o. male for which pharmacy has been consulted for cefepime and vancomycin dosing for sepsis.  Patient with a history of CAD, arthritis, gout, GERD, OSA, HTN, prostate CA, pancreatic cancer. Patient presenting with elevated temperatures and diarrhea x 2 days following chemotherapy.  SCr 1.1 WBC 5; LA 1.5; T 100.2; HR 69; RR 19  Plan: Metronidazole per MD Cefepime 2g q12hr  Vancomycin 2000 mg once then 1000 mg q24hr (eAUC 441.9) unless change in renal function Monitor WBC, fever, renal function, cultures De-escalate when able Levels at steady state  Height: 5\' 8"  (172.7 cm) Weight: 100 kg (220 lb 7.4 oz) IBW/kg (Calculated) : 68.4  Temp (24hrs), Avg:102.9 F (39.4 C), Min:102.9 F (39.4 C), Max:102.9 F (39.4 C)  Recent Labs  Lab 01/07/23 1159 01/09/23 1635  WBC 4.7 5.0  CREATININE 0.85 1.10  LATICACIDVEN  --  1.5    Estimated Creatinine Clearance: 58.3 mL/min (by C-G formula based on SCr of 1.1 mg/dL).    Allergies  Allergen Reactions   Contrast Media [Iodinated Contrast Media] Palpitations    TACHYCARDIA- patient states he has tolerated newer agents since this reaction >30 yrs ago   Microbiology results: Pending  Thank you for allowing pharmacy to be a part of this patient's care.  Delmar Landau, PharmD, BCPS 01/09/2023 6:15 PM ED Clinical Pharmacist -  [phone]

## 2023-01-09 NOTE — ED Provider Notes (Signed)
Marc[facility] [name] Provider Note   CSN: [id number] Arrival date & time: 01/09/23  1616     History  Chief Complaint  Patient presents with   Fever   Chemotherapy    Marcus [name] is a 82 y.o. male.   Fever Associated symptoms: chills and diarrhea   Patient presents for multiple complaints.  Medical history includes CAD, arthritis, gout, GERD, OSA, HTN, prostate CA, pancreatic cancer.  He was diagnosed with pancreatic cancer a week ago.  He underwent his first chemotherapy session 2 days ago.  Yesterday, he developed fatigue, generalized weakness, lower extremity edema, and exertional shortness of breath.  He has had some mild diarrhea and some difficulty urinating.  Today, symptoms worsened and he developed chills.  This prompted him to come to the ED.  Currently, he denies any areas of pain.  He denies any shortness of breath at rest.     Home Medications Prior to Admission medications   Medication Sig Start Date End Date Taking? Authorizing Provider  allopurinol (ZYLOPRIM) 300 MG tablet Take 1 tablet (300 mg total) by mouth daily. 05/25/22   Nafziger, Kandee Keen, NP  amLODipine (NORVASC) 10 MG tablet Take 1 tablet (10 mg total) by mouth daily. 05/25/22   Nafziger, Kandee Keen, NP  apixaban (ELIQUIS) 5 MG TABS tablet Take 1 tablet (5 mg total) by mouth 2 (two) times daily. 12/25/22   Mansouraty, Netty Starring., MD  Calcium Carb-Cholecalciferol (CALCIUM 600 + D PO) Take 1 tablet by mouth daily. 800 units of vitamin D    [provider]  camphor-menthol (SARNA) lotion Apply topically as needed for itching. 12/15/22   Rolly Salter, MD  esomeprazole (NEXIUM) 40 MG capsule Take 1 capsule (40 mg total) by mouth 2 (two) times daily before a meal. 12/23/22 12/23/23  Mansouraty, Netty Starring., MD  hydrOXYzine (ATARAX) 50 MG tablet Take 1 tablet (50 mg total) by mouth 3 (three) times daily as needed. 12/23/22 03/23/23  Nafziger, Kandee Keen, NP  lidocaine-prilocaine  (EMLA) cream Apply 1 Application topically as needed. 12/31/22   [name], MD  Multiple Vitamin (MULTIVITAMIN) tablet Take 1 tablet by mouth in the morning.    [provider]  ondansetron (ZOFRAN) 8 MG tablet Take 1 tablet (8 mg total) by mouth every 8 (eight) hours as needed. 12/31/22   [name], MD  polyethylene glycol powder (GLYCOLAX/MIRALAX) 17 GM/SCOOP powder Take 17g (1 capful) by mouth as directed daily as needed for mild constipation. 12/15/22   Rolly Salter, MD  prochlorperazine (COMPAZINE) 10 MG tablet Take 1 tablet (10 mg total) by mouth every 6 (six) hours as needed for nausea or vomiting. 12/31/22   [name], MD  Respiratory Therapy Supplies (FLUTTER) DEVI 1 each by Does not apply route in the morning and at bedtime. 10/11/19   Karie Fetch P, DO  sodium chloride HYPERTONIC 3 % nebulizer solution Take by nebulization as needed for other. 04/01/22   [name], NP  sucralfate (CARAFATE) 1 GM/10ML suspension Take 10 mLs (1 g total) by mouth 2 (two) times daily. 12/23/22 12/23/23  Mansouraty, Netty Starring., MD  tamsulosin (FLOMAX) 0.4 MG CAPS capsule Take 0.4 mg by mouth daily. 03/04/16   [provider]      Allergies    Contrast media [iodinated contrast media]    Review of Systems   Review of Systems  Constitutional:  Positive for chills, fatigue and fever.  Respiratory:  Positive for shortness  of breath.   Gastrointestinal:  Positive for diarrhea.  Genitourinary:  Positive for difficulty urinating.  Neurological:  Positive for weakness (Generalized).  All other systems reviewed and are negative.   Physical Exam Updated Vital Signs BP (!) 101/56   Pulse 72   Temp 100.2 F (37.9 C) (Oral)   Resp 13   Ht 5\' 8"  (1.727 m)   Wt 100 kg   SpO2 99%   BMI 33.52 kg/m  Physical Exam Vitals and nursing note reviewed.  Constitutional:      General: He is not in acute distress.    Appearance: Normal appearance. He is well-developed. He  is not ill-appearing, toxic-appearing or diaphoretic.  HENT:     Head: Normocephalic and atraumatic.     Right Ear: External ear normal.     Left Ear: External ear normal.     Nose: Nose normal.     Mouth/Throat:     Mouth: Mucous membranes are moist.  Eyes:     Extraocular Movements: Extraocular movements intact.     Conjunctiva/sclera: Conjunctivae normal.  Cardiovascular:     Rate and Rhythm: Normal rate and regular rhythm.     Heart sounds: No murmur heard. Pulmonary:     Effort: Pulmonary effort is normal. No respiratory distress.     Breath sounds: Normal breath sounds. No wheezing or rales.  Chest:     Chest wall: No tenderness.  Abdominal:     General: There is no distension.     Palpations: Abdomen is soft.     Tenderness: There is no abdominal tenderness.  Musculoskeletal:     Cervical back: Normal range of motion and neck supple.     Right lower leg: Edema present.     Left lower leg: Edema present.  Skin:    General: Skin is warm and dry.     Coloration: Skin is not jaundiced or pale.  Neurological:     General: No focal deficit present.     Mental Status: He is alert and oriented to person, place, and time.     Cranial Nerves: No cranial nerve deficit.     Sensory: No sensory deficit.     Motor: No weakness.     Coordination: Coordination normal.  Psychiatric:        Mood and Affect: Mood normal.        Behavior: Behavior normal.        Thought Content: Thought content normal.        Judgment: Judgment normal.     ED Results / Procedures / Treatments   Labs (all labs ordered are listed, but only abnormal results are displayed) Labs Reviewed  COMPREHENSIVE METABOLIC PANEL - Abnormal; Notable for the following components:      Result Value   Potassium 3.3 (*)    CO2 21 (*)    Glucose, Bld 145 (*)    Calcium 7.9 (*)    Total Protein 5.7 (*)    Albumin 2.6 (*)    Alkaline Phosphatase 140 (*)    Total Bilirubin 1.5 (*)    All other components within  normal limits  CBC WITH DIFFERENTIAL/PLATELET - Abnormal; Notable for the following components:   RBC 3.00 (*)    Hemoglobin 8.9 (*)    HCT 28.1 (*)    Lymphs Abs 0.5 (*)    All other components within normal limits  PROTIME-INR - Abnormal; Notable for the following components:   Prothrombin Time 16.0 (*)    INR  1.3 (*)    All other components within normal limits  URINALYSIS, W/ REFLEX TO CULTURE (INFECTION SUSPECTED) - Abnormal; Notable for the following components:   Color, Urine AMBER (*)    APPearance HAZY (*)    Protein, ur 30 (*)    All other components within normal limits  BRAIN NATRIURETIC PEPTIDE - Abnormal; Notable for the following components:   B Natriuretic Peptide 282.4 (*)    All other components within normal limits  TROPONIN I (HIGH SENSITIVITY) - Abnormal; Notable for the following components:   Troponin I (High Sensitivity) 19 (*)    All other components within normal limits  RESPIRATORY PANEL BY PCR  CULTURE, BLOOD (ROUTINE X 2)  CULTURE, BLOOD (ROUTINE X 2)  LACTIC ACID, PLASMA  LACTIC ACID, PLASMA  LIPASE, BLOOD  MAGNESIUM  TROPONIN I (HIGH SENSITIVITY)    EKG EKG Interpretation Date/Time:  Sunday January 09 2023 16:37:25 EDT Ventricular Rate:  84 PR Interval:  208 QRS Duration:  96 QT Interval:  372 QTC Calculation: 439 R Axis:   -6  Text Interpretation: Sinus rhythm with occasional Premature ventricular complexes Nonspecific T wave abnormality Abnormal ECG Confirmed by Gloris Manchester ([id number]) on 01/09/2023 6:59:03 PM  Radiology CT Angio Chest PE W and/or Wo Contrast  Result Date: 01/09/2023 CLINICAL DATA:  PE suspected, metastatic pancreatic cancer * Tracking Code: BO * EXAM: CT ANGIOGRAPHY CHEST WITH CONTRAST TECHNIQUE: Multidetector CT imaging of the chest was performed using the standard protocol during bolus administration of intravenous contrast. Multiplanar CT image reconstructions and MIPs were obtained to evaluate the vascular anatomy. RADIATION  DOSE REDUCTION: This exam was performed according to the departmental dose-optimization program which includes automated exposure control, adjustment of the mA and/or kV according to patient size and/or use of iterative reconstruction technique. CONTRAST:  75mL OMNIPAQUE IOHEXOL 350 MG/ML SOLN COMPARISON:  09/27/2019 FINDINGS: Cardiovascular: Satisfactory opacification of the pulmonary arteries to the segmental level. No evidence of pulmonary embolism. Mild cardiomegaly. Left and right coronary artery calcifications. Left chest multi lead pacer defibrillator. No pericardial effusion. Aortic atherosclerosis. Right chest port catheter. Mediastinum/Nodes: No enlarged mediastinal, hilar, or axillary lymph nodes. Thyroid gland, trachea, and esophagus demonstrate no significant findings. Lungs/Pleura: Small bilateral pleural effusions and associated atelectasis or consolidation. Acute appearing, heterogeneous airspace opacity of the superior segment left lower lobe (series 4, image 62). Upper Abdomen: Please see separately reported examination of the abdomen and pelvis Musculoskeletal: No chest wall abnormality. No acute osseous findings. Disc degenerative disease and bridging osteophytosis throughout the thoracic spine, in keeping with DISH. Review of the MIP images confirms the above findings. IMPRESSION: 1. Negative examination for pulmonary embolism. 2. Small bilateral pleural effusions and associated atelectasis or consolidation. 3. Acute appearing, heterogeneous airspace opacity of the superior segment left lower lobe, consistent with infection or aspiration. 4. Coronary artery disease. Aortic Atherosclerosis (ICD10-I70.0). Electronically Signed   By: Jearld Lesch M.D.   On: 01/09/2023 21:09   CT ABDOMEN PELVIS W CONTRAST  Result Date: 01/09/2023 CLINICAL DATA:  Abdominal pain, pancreatic mass, * Tracking Code: BO * EXAM: CT ABDOMEN AND PELVIS WITH CONTRAST TECHNIQUE: Multidetector CT imaging of the abdomen and  pelvis was performed using the standard protocol following bolus administration of intravenous contrast. RADIATION DOSE REDUCTION: This exam was performed according to the departmental dose-optimization program which includes automated exposure control, adjustment of the mA and/or kV according to patient size and/or use of iterative reconstruction technique. CONTRAST:  75mL OMNIPAQUE IOHEXOL 350 MG/ML SOLN COMPARISON:  CT  abdomen pelvis, 12/03/2022 FINDINGS: Lower chest: Please see separately reported examination of the chest Hepatobiliary: Multiple low-attenuation liver lesions are unchanged, the largest of which are clearly benign fluid attenuation cysts, others more ill-defined and incompletely characterized, although remains suspicious for small metastases. Distended gallbladder mild gallbladder wall thickening and adjacent pericholecystic fat stranding (series 5, image 43). Sludge in the gallbladder fundus (series 9, image 78). Common bile duct stent has been placed, with diminished, although persistent intrahepatic biliary ductal dilatation (series 5, image 31, series 8, image 74). Pancreas: Similar appearance of ill-defined hypodense mass in the superior pancreatic head measuring 2.7 x 2.7 cm (series 5, image 37). Mild atrophy of the distal pancreas with prominence of the pancreatic duct measuring up to 0.5 cm, unchanged (series 5, image 35). Spleen: Normal in size without significant abnormality. Adrenals/Urinary Tract: Adrenal glands are unremarkable. Simple, benign bilateral renal cortical cysts, for which no further follow-up or characterization is required. Kidneys are normal, without renal calculi, solid lesion, or hydronephrosis. Bladder is unremarkable. Stomach/Bowel: Status post Roux type gastric bypass. Appendix appears normal. No evidence of bowel wall thickening, distention, or inflammatory changes. Status post sigmoid colon resection and reanastomosis. Vascular/Lymphatic: Aortic atherosclerosis.  No enlarged abdominal or pelvic lymph nodes. Reproductive: Prostate brachytherapy. Other: Mesh repair of broad-based midline ventral hernia. Anasarca. No ascites. Musculoskeletal: No acute or significant osseous findings. IMPRESSION: 1. Distended gallbladder with mild gallbladder wall thickening and adjacent pericholecystic fat stranding. Sludge in the gallbladder fundus. Findings are concerning for acute cholecystitis. 2. Common bile duct stent has been placed, with diminished, although persistent intrahepatic biliary ductal dilatation. 3. Similar appearance of ill-defined hypodense mass in the superior pancreatic head, consistent with known pancreatic adenocarcinoma. Mild atrophy of the distal pancreas with prominence of the pancreatic duct measuring up to 0.5 cm, unchanged. 4. Multiple low-attenuation liver lesions are unchanged, the largest of which are clearly benign fluid attenuation cysts, others more ill-defined and incompletely characterized, although in keeping with small metastases. 5. Status post Roux type gastric bypass. 6. Status post sigmoid colon resection and reanastomosis. 7. Prostate brachytherapy. Aortic Atherosclerosis (ICD10-I70.0). Electronically Signed   By: Jearld Lesch M.D.   On: 01/09/2023 21:05   DG Chest 2 View  Result Date: 01/09/2023 CLINICAL DATA:  Suspected sepsis EXAM: CHEST - 2 VIEW COMPARISON:  Radiograph 11/24/2022 FINDINGS: LEFT-sided pacer overlies normal cardiac silhouette. Port in the anterior chest wall with tip in distal SVC. Elevation LEFT hemidiaphragm. No effusion, infiltrate or pneumothorax. Hyperinflated lungs. IMPRESSION: 1. No acute cardiopulmonary process. 2. Hyperinflated lungs suggest COPD. Electronically Signed   By: Genevive Bi M.D.   On: 01/09/2023 17:28    Procedures Procedures    Medications Ordered in ED Medications  ceFEPIme (MAXIPIME) 2 g in sodium chloride 0.9 % 100 mL IVPB (has no administration in time range)  vancomycin (VANCOCIN)  IVPB 1000 mg/200 mL premix (has no administration in time range)  lactated ringers bolus 1,000 mL (has no administration in time range)  acetaminophen (TYLENOL) tablet 650 mg (650 mg Oral Given 01/09/23 1655)  ceFEPIme (MAXIPIME) 2 g in sodium chloride 0.9 % 100 mL IVPB (0 g Intravenous Stopped 01/09/23 2116)  metroNIDAZOLE (FLAGYL) IVPB 500 mg (0 mg Intravenous Stopped 01/09/23 2117)  potassium chloride SA (KLOR-CON M) CR tablet 40 mEq (40 mEq Oral Given 01/09/23 1910)  lactated ringers bolus 250 mL (0 mLs Intravenous Stopped 01/09/23 1945)  vancomycin (VANCOREADY) IVPB 2000 mg/400 mL (2,000 mg Intravenous New Bag/Given 01/09/23 1859)  iohexol (OMNIPAQUE) 350 MG/ML injection  75 mL (75 mLs Intravenous Contrast Given 01/09/23 2052)    ED Course/ Medical Decision Making/ A&P Clinical Course as of 01/09/23 2128  Sun Jan 09, 2023  2122 Stable. Fever 102.9  Pancreatic cancer. CTA nondiagnostic Started chemo Friday, white count ok  CTA with ?PNA [CC]    Clinical Course User Index [CC] Glyn Ade, MD                             Medical Decision Making Amount and/or Complexity of Data Reviewed Labs: ordered. Radiology: ordered.  Risk Prescription drug management.   This patient presents to the ED for concern of fever and fatigue, this involves an extensive number of treatment options, and is a complaint that carries with it a high risk of complications and morbidity.  The differential diagnosis includes infection, heart failure, PE, reaction to chemotherapy   Co morbidities that complicate the patient evaluation  CAD, arthritis, gout, GERD, OSA, HTN, prostate CA, pancreatic cancer.   Additional history obtained:  Additional history obtained from patient's daughters External records from outside source obtained and reviewed including EMR   Lab Tests:  I Ordered, and personally interpreted labs.  The pertinent results include: No leukocytosis, baseline anemia, improved hepatobiliary  enzymes, slight worsening of hypoalbuminemia, hypocalcemia and hypokalemia.  No evidence of UTI.   Imaging Studies ordered:  I ordered imaging studies including CTA chest, CT of abdomen and pelvis I independently visualized and interpreted imaging which showed left lower lobe airspace opacity concerning for pneumonia; distended gallbladder with mild wall thickening and some adjacent fat stranding concerning for cholecystitis. I agree with the radiologist interpretation   Cardiac Monitoring: / EKG:  The patient was maintained on a cardiac monitor.  I personally viewed and interpreted the cardiac monitored which showed an underlying rhythm of: Sinus rhythm   Consultations Obtained:  I requested consultation with the oncologist, Dr. Bertis Ruddy,  and discussed lab and imaging findings as well as pertinent plan - they recommend: Although this may be secondary to "tumor fever", after initiating hemotherapy, patient to be treated empirically for infection pending culture results   Problem List / ED Course / Critical interventions / Medication management  Patient presents for new onset of fatigue, exertional shortness of breath, lower extremity edema, chills, all since initiating chemotherapy 2 days ago.  On arrival in the ED, he is found to have a fever of 102.9 degrees.  He was given Tylenol for antipyresis.  Other vital signs are notable for widened pulse pressure.  It appears that this is present on prior visits last week.  He does have chronic anemia which does not seem to have worsened on today's lab work.  No leukocytosis or leukopenia is present on CBC.  On exam, patient is well-appearing.  He denies any current areas of pain.  He does have pitting edema in his distal lower extremities.  He has no increased work of breathing at rest.  Lungs are clear to auscultation.  SpO2 is normal on room air.  His abdomen is soft and without tenderness.  His daughters report that his jaundice is improved.  I  spoke with oncologist, Dr. Emeline Darling [facility] regarding his symptoms following chemotherapy.  She states that this may be an inflammatory reaction secondary to the chemotherapy, given his liver metastases.  She does advise treating empirically for infection pending culture results.  Broad-spectrum antibiotics were initiated.  Gentle IV fluids were given pending results of BNP.  I notified his oncologist, Dr. Truett Perna, via email of his arrival in the ED and plan for admission.  CT imaging showed concern for left lower lobe pneumonia in addition to some gallbladder findings of distention and wall thickening with adjacent fat stranding.  Gallbladder findings may be secondary to anasarca which seems to be driven by poor oncotic pressure.  Of note, he does not have any right upper quadrant pain or tenderness.  An ultrasound was ordered to further characterize.  Patient will need admission.  Care of patient was signed out to oncoming ED provider. I ordered medication including IV fluids and broad-spectrum antibiotics for empiric treatment of infection; Tylenol for antipyresis; potassium chloride for hypokalemia Reevaluation of the patient after these medicines showed that the patient improved I have reviewed the patients home medicines and have made adjustments as needed   Social Determinants of Health:  Has access to outpatient care         Final Clinical Impression(s) / ED Diagnoses Final diagnoses:  Pneumonia of left lower lobe due to infectious organism    Rx / DC Orders ED Discharge Orders     None         Gloris Manchester, MD 01/09/23 2128

## 2023-01-10 ENCOUNTER — Inpatient Hospital Stay (HOSPITAL_COMMUNITY): Payer: No Typology Code available for payment source

## 2023-01-10 ENCOUNTER — Other Ambulatory Visit (HOSPITAL_COMMUNITY): Payer: No Typology Code available for payment source

## 2023-01-10 ENCOUNTER — Telehealth: Payer: Self-pay

## 2023-01-10 ENCOUNTER — Ambulatory Visit (HOSPITAL_COMMUNITY): Payer: No Typology Code available for payment source

## 2023-01-10 ENCOUNTER — Encounter (HOSPITAL_COMMUNITY): Payer: Self-pay | Admitting: Internal Medicine

## 2023-01-10 DIAGNOSIS — N179 Acute kidney failure, unspecified: Secondary | ICD-10-CM | POA: Insufficient documentation

## 2023-01-10 DIAGNOSIS — A419 Sepsis, unspecified organism: Secondary | ICD-10-CM

## 2023-01-10 DIAGNOSIS — C250 Malignant neoplasm of head of pancreas: Secondary | ICD-10-CM

## 2023-01-10 DIAGNOSIS — Z9889 Other specified postprocedural states: Secondary | ICD-10-CM

## 2023-01-10 LAB — URINALYSIS, ROUTINE W REFLEX MICROSCOPIC
Bilirubin Urine: NEGATIVE
Glucose, UA: NEGATIVE mg/dL
Ketones, ur: NEGATIVE mg/dL
Nitrite: POSITIVE — AB
Protein, ur: 100 mg/dL — AB
RBC / HPF: >50 RBC/hpf (ref 0–5)
Specific Gravity, Urine: 1.023 (ref 1.005–1.030)
WBC, UA: >50 WBC/hpf (ref 0–5)
pH: 5 (ref 5.0–8.0)

## 2023-01-10 LAB — CBC
HCT: 24.9 % — ABNORMAL LOW (ref 39.0–52.0)
Hemoglobin: 8.1 g/dL — ABNORMAL LOW (ref 13.0–17.0)
MCH: 30.1 pg (ref 26.0–34.0)
MCHC: 32.5 g/dL (ref 30.0–36.0)
MCV: 92.6 fL (ref 80.0–100.0)
Platelets: 131 K/uL — ABNORMAL LOW (ref 150–400)
RBC: 2.69 MIL/uL — ABNORMAL LOW (ref 4.22–5.81)
RDW: 15.5 % (ref 11.5–15.5)
WBC: 3.9 K/uL — ABNORMAL LOW (ref 4.0–10.5)
nRBC: 0 % (ref 0.0–0.2)

## 2023-01-10 LAB — COMPREHENSIVE METABOLIC PANEL WITH GFR
ALT: 22 U/L (ref 0–44)
AST: 21 U/L (ref 15–41)
Albumin: 2.2 g/dL — ABNORMAL LOW (ref 3.5–5.0)
Alkaline Phosphatase: 117 U/L (ref 38–126)
Anion gap: 10 (ref 5–15)
BUN: 20 mg/dL (ref 8–23)
CO2: 20 mmol/L — ABNORMAL LOW (ref 22–32)
Calcium: 7.8 mg/dL — ABNORMAL LOW (ref 8.9–10.3)
Chloride: 107 mmol/L (ref 98–111)
Creatinine, Ser: 1.15 mg/dL (ref 0.61–1.24)
GFR, Estimated: >60 mL/min
Glucose, Bld: 147 mg/dL — ABNORMAL HIGH (ref 70–99)
Potassium: 3.2 mmol/L — ABNORMAL LOW (ref 3.5–5.1)
Sodium: 137 mmol/L (ref 135–145)
Total Bilirubin: 1.7 mg/dL — ABNORMAL HIGH (ref 0.3–1.2)
Total Protein: 5.3 g/dL — ABNORMAL LOW (ref 6.5–8.1)

## 2023-01-10 LAB — TROPONIN I (HIGH SENSITIVITY): Troponin I (High Sensitivity): 17 ng/L (ref <18)

## 2023-01-10 LAB — PROCALCITONIN: Procalcitonin: 0.3 ng/mL

## 2023-01-10 LAB — MAGNESIUM: Magnesium: 1.7 mg/dL (ref 1.7–2.4)

## 2023-01-10 LAB — STREP PNEUMONIAE URINARY ANTIGEN: Strep Pneumo Urinary Antigen: NEGATIVE

## 2023-01-10 LAB — PHOSPHORUS: Phosphorus: 2.1 mg/dL — ABNORMAL LOW (ref 2.5–4.6)

## 2023-01-10 LAB — URIC ACID: Uric Acid, Serum: 4 mg/dL (ref 3.7–8.6)

## 2023-01-10 MED ORDER — FUROSEMIDE 10 MG/ML IJ SOLN
20.0000 mg | Freq: Two times a day (BID) | INTRAMUSCULAR | Status: DC
  Administered 2023-01-10 (×2): 20 mg via INTRAVENOUS
  Filled 2023-01-10 (×2): qty 2

## 2023-01-10 MED ORDER — MELATONIN 5 MG PO TABS
10.0000 mg | ORAL_TABLET | Freq: Every day | ORAL | Status: DC
  Administered 2023-01-10 – 2023-01-12 (×4): 10 mg via ORAL
  Filled 2023-01-10 (×4): qty 2

## 2023-01-10 MED ORDER — LIDOCAINE HCL URETHRAL/MUCOSAL 2 % EX GEL
1.0000 | Freq: Once | CUTANEOUS | Status: DC
  Filled 2023-01-10: qty 6

## 2023-01-10 MED ORDER — GUAIFENESIN ER 600 MG PO TB12
600.0000 mg | ORAL_TABLET | Freq: Two times a day (BID) | ORAL | Status: DC
  Administered 2023-01-10 – 2023-01-13 (×6): 600 mg via ORAL
  Filled 2023-01-10 (×8): qty 1

## 2023-01-10 MED ORDER — POTASSIUM CHLORIDE 10 MEQ/100ML IV SOLN
10.0000 meq | INTRAVENOUS | Status: AC
  Administered 2023-01-10 (×4): 10 meq via INTRAVENOUS
  Filled 2023-01-10 (×4): qty 100

## 2023-01-10 MED ORDER — LIDOCAINE HCL URETHRAL/MUCOSAL 2 % EX GEL
1.0000 | Freq: Once | CUTANEOUS | Status: AC
  Administered 2023-01-10: 1 via URETHRAL
  Filled 2023-01-10: qty 6

## 2023-01-10 MED ORDER — HYDROXYZINE HCL 25 MG PO TABS
25.0000 mg | ORAL_TABLET | Freq: Three times a day (TID) | ORAL | Status: DC | PRN
  Administered 2023-01-11: 25 mg via ORAL
  Filled 2023-01-10: qty 1

## 2023-01-10 MED ORDER — TECHNETIUM TC 99M MEBROFENIN IV KIT
5.0000 | PACK | Freq: Once | INTRAVENOUS | Status: AC | PRN
  Administered 2023-01-10: 5 via INTRAVENOUS

## 2023-01-10 MED ORDER — SODIUM CHLORIDE 0.9 % IV SOLN: INTRAVENOUS | Status: DC | PRN

## 2023-01-10 MED ORDER — HEPARIN BOLUS VIA INFUSION
3000.0000 [IU] | Freq: Once | INTRAVENOUS | Status: AC
  Administered 2023-01-10: 3000 [IU] via INTRAVENOUS
  Filled 2023-01-10: qty 3000

## 2023-01-10 MED ORDER — SODIUM CHLORIDE 0.9% FLUSH: 10.0000 mL | INTRAVENOUS | Status: DC | PRN

## 2023-01-10 MED ORDER — PHENAZOPYRIDINE HCL 100 MG PO TABS
100.0000 mg | ORAL_TABLET | Freq: Three times a day (TID) | ORAL | Status: AC | PRN
  Administered 2023-01-10 (×4): 100 mg via ORAL
  Filled 2023-01-10 (×6): qty 1

## 2023-01-10 MED ORDER — CYCLOBENZAPRINE HCL 5 MG PO TABS
5.0000 mg | ORAL_TABLET | Freq: Three times a day (TID) | ORAL | Status: DC | PRN
  Administered 2023-01-10 – 2023-01-11 (×2): 5 mg via ORAL
  Filled 2023-01-10 (×2): qty 1

## 2023-01-10 MED ORDER — CHLORHEXIDINE GLUCONATE CLOTH 2 % EX PADS
6.0000 | MEDICATED_PAD | Freq: Every day | CUTANEOUS | Status: DC
  Administered 2023-01-11 – 2023-01-13 (×3): 6 via TOPICAL

## 2023-01-10 MED ORDER — FUROSEMIDE 10 MG/ML IJ SOLN
40.0000 mg | Freq: Two times a day (BID) | INTRAMUSCULAR | Status: DC
  Administered 2023-01-10 – 2023-01-13 (×6): 40 mg via INTRAVENOUS
  Filled 2023-01-10 (×6): qty 4

## 2023-01-10 MED ORDER — MORPHINE SULFATE (PF) 2 MG/ML IV SOLN
1.0000 mg | Freq: Once | INTRAVENOUS | Status: AC
  Administered 2023-01-10: 1 mg via INTRAVENOUS
  Filled 2023-01-10: qty 1

## 2023-01-10 MED ORDER — SODIUM CHLORIDE 0.9 % IV SOLN
500.0000 mg | INTRAVENOUS | Status: DC
  Administered 2023-01-10 – 2023-01-13 (×4): 500 mg via INTRAVENOUS
  Filled 2023-01-10 (×4): qty 5

## 2023-01-10 MED ORDER — HEPARIN (PORCINE) 25000 UT/250ML-% IV SOLN
1400.0000 [IU]/h | INTRAVENOUS | Status: AC
  Administered 2023-01-10 – 2023-01-11 (×2): 1400 [IU]/h via INTRAVENOUS
  Filled 2023-01-10: qty 250

## 2023-01-10 NOTE — Progress Notes (Signed)
ANTICOAGULATION CONSULT NOTE   Pharmacy Consult for heparin Indication: atrial fibrillation  Allergies  Allergen Reactions   Contrast Media [Iodinated Contrast Media] Palpitations    TACHYCARDIA- patient states he has tolerated newer agents since this reaction >30 yrs ago    Patient Measurements: Height: 5\' 8"  (172.7 cm) Weight: 103.2 kg (227 lb 8.2 oz) IBW/kg (Calculated) : 68.4 Heparin Dosing Weight: 90kg  Vital Signs: Temp: 98.5 F (36.9 C) (07/08 0806) BP: 128/66 (07/08 0806) Pulse Rate: 80 (07/08 0806)  Labs: Recent Labs    01/09/23 1635 01/09/23 1809 01/10/23 0144  HGB 8.9*  --  8.1*  HCT 28.1*  --  24.9*  PLT 160  --  131*  LABPROT 16.0*  --   --   INR 1.3*  --   --   CREATININE 1.10  --  1.15  TROPONINIHS  --  19* 17     Estimated Creatinine Clearance: 56.7 mL/min (by C-G formula based on SCr of 1.15 mg/dL).   Assessment: 82yo male had first chemotherapy on Friday and now c/o fever, diarrhea, and urinary retention >> admitted for concern for sepsis; plan for ERCP on 7/8 and to start heparin for Afib overnight; of note pt is on apixaban PTA for Afib but pt states that he was never told to resume it after held for procedure 6/6.  Heparin on hold since 0800 for ERCP. ERCP cancelled, HIDA scan results unremarkable so okay with Dr. Sherryll Burger to resume heparin gtt.   Goal of Therapy:  Heparin level 0.3-0.7 units/ml Monitor platelets by anticoagulation protocol: Yes   Plan:  Heparin 3000 units IV bolus x1 followed by infusion at 1400 units/hr F/u 8 hr heparin level  Daily heparin level and CBC F/u ability to transition back to apixaban  Christoper Fabian, PharmD, BCPS Please see amion for complete clinical pharmacist phone list 01/10/2023,4:29 PM

## 2023-01-10 NOTE — Progress Notes (Signed)
Heart Failure Navigator Progress Note  Assessed for Heart & Vascular TOC clinic readiness.  Patient does not meet criteria due to EF 45-50%, per MD note patient with Metastatic cancer. .   Navigator will sign off at this time.    Rhae Hammock, BSN, Scientist, clinical (histocompatibility and immunogenetics) Only

## 2023-01-10 NOTE — Progress Notes (Signed)
PROGRESS NOTE    Marcus [name]  ZOX:[id number] DOB: Mar 06, 1941 DOA: 01/09/2023 PCP: [name], NP   Brief Narrative:    This is a 82 year old man medical history of prostate cancer s/p radioactive seed implant in 2020, history of colon cancer status post right colectomy in 1989, COPD, hypertension, paroxysmal atrial fibrillation (not currently on Eliquis since last hospital discharge from 12/15/2022 for EGD and EUS biopsy), arthritis, gout, GERD, OSA, hyperlipidemia, chronic biliary ductal obstruction s/p stent placement 12/15/2022 due to malignant neoplasm, , recently diagnosed with pancreatic cancer 2 weeks ago and received first session of chemotherapy on 01/07/2023 presented with emergency department complaining of generalized weakness, lower extremity edema, and exertional shortness of breath.  Patient was admitted for sepsis secondary to community-acquired pneumonia as well as concerns for combined CHF exacerbation.  GI consulted for ERCP and bile duct exchange in a.m, but it appears that he may not require this and HIDA scan has been ordered to rule out acute cholecystitis.  Assessment & Plan:   Principal Problem:   Sepsis secondary from pneumonia and acute cholecystitis  (HCC) Active Problems:   CAP (community acquired pneumonia)   CHF exacerbation (HCC)   Combined systolic and diastolic heart failure (HCC)   COPD with acute exacerbation (HCC)   Acute cholecystitis   Gout   Atrial fibrillation, chronic (HCC)   Pancreatic adenocarcinoma (HCC)   Hypokalemia   History of biliary duct stent placement   Pruritus   AKI (acute kidney injury) (HCC)  Assessment and Plan:   Sepsis secondary from pneumonia Community-acquired pneumonia (patient is immunocompromised and recent hospital admission concern for pneumonia associated with Staph aureus and pseudomonas) -Patient comes in with complaining of fever, chills, shortness of breath, sputum production over the course of last 3  days. -Initial presentation patient found febrile 102.9 degrees Fahrenheit, pressure 129/49 and heart rate 84.  WBC 5.  As patient has active cancer currently on chemotherapy leukocytosis is not evident.  Lactic acid 1.5 WNL. - Chest x-ray showed evidence of COPD. -CTA chest showed small bilateral pleural effusion, acute appearing heterogenous airspace opacity of the superior segment of the left lobe consistent with infection/aspiration. - Given patient is immunocompromised and recent hospital admission 3 weeks ago there is concern for stay for low and Pseudomonas associated pneumonia versus complete tach or pneumonia as well. -Chemo-Port site looks clean no concern for infection. -UA unremarkable. -Sepsis secondary from in the setting of pneumonia and acute cholecystitis. -In the ED patient was getting IV fluid LR 150 cc/h per sepsis protocol however physical exam showed evidence of lower extremity pitting edema 2+ and echo showed reduced EF less than 45% there is concern for CHF exacerbation as well.  So I have to stop the fluid. - Continue broad-spectrum antibiotic vancomycin and cefepime per pharmacy protocol.  Continue azithromycin for atypical coverage. - Pending BC, sputum culture, urine strep pneumonia antigen, urine Legionella antigen. - Continue supplemental oxygen, incentive spirometry, flutter valve, and chest.  Respiratory.     CHF exacerbation Combined systolic and diastolic heart failure EF <45 to 50% -Patient is complaining about increasing sputum production, worsening bilateral lower extremity swelling and pitting edema.  He is also endorsing orthopnea, dyspnea and PND.  Physical exam showed bilateral lower extremity pitting edema 2+ -Per chart review echo from 12/14/2022 reduced EF 45 to 50% and grade 1 diastolic dysfunction. -As mentioned above patient was receiving IV fluid LR 150 cc/h for sepsis protocol however as physical exam and history evidence for CHF  exacerbation I have  to stop the IV fluid.  Patient's blood pressure is borderline soft. - Increase IV diuresis to 40 mg twice daily and monitor carefully for goal output of 2-3 L daily -Monitor for strict I's/O and daily weight -Continue fluid restriction 2 L/day and salt restriction 2 g/day.  Will keep patient n.p.o. after midnight for bile duct stent exchange tomorrow. -As patient has an echo 1 month ago no need to repeat it today. - Continue telemonitoring   History of hypertension -At home patient is on amlodipine 10 mg daily.  Currently blood pressure is borderline soft in the setting of sepsis.  Also he has CHF exacerbation at the same time. -Continuing Lasix 20 mg IV twice daily for CHF exacerbation.       COPD exacerbation -Patient reported history of COPD but not any treatment at home? -Chest x-ray showed hypertension for short of the lung suggested COPD. - Patient reporting worsening shortness of breath and mucoid sputum production.  It can be COPD or CHF exacerbation or combined both. Also pneumonia can exacerbate the COPD as well. - Starting DuoNeb every 6 hours scheduled and Xopenex as needed. - In the setting of pneumonia and acute cholecystitis holding prednisone as it can exacerbate acute inflammatory state. -Starting azithromycin 500 mg daily for 5 days to control inflammation for COPD exacerbation and also covering atypical for CAP/ -Continue supplemental oxygen with O2 sat above 92% - Continue supplemental oxygen, incentive spirometry, flutter valve, and chest.  Respiratory.   Acute cholecystitis Biliary stenosis status post stent placement -CT abdomen pelvis showed Distended gallbladder with mild gallbladder wall thickening and adjacent pericholecystic fat stranding. Sludge in the gallbladder fundus. Findings are concerning for acute cholecystitis.   Ultrasound similar finding. -Patient had new diagnosis of pancreatic head adenocarcinoma as mentioned above and recent hospital admission  showed biliary duct stenosis status post stent placement on 12/15/2022. - Consulted with GI Dr.Meesna-recommended  hold the Eliquis - if need use heparin - I think he may need an ERCP and stent change,. From what I see I suppose cholecystitis is possible though not clear. We will see him in AM. In case it is possible to schedule a procedure hold NPO except meds in AM and would stop heparin at 0800 if used overnight. -Appreciate GI input and recommendation with plans for HIDA scan today   Pancreatic head adenocarcinoma ( Diagnosed on 12/29/2022 and first chemotherapy on 01/08/2023) -As mentioned above patient has been diagnosed with pancreatic adenocarcinoma per biopsy.  Started on chemotherapy first dose on 12/27/2022. - Appreciate oncology evaluation   Elevated troponin -High sensitive troponin 19 slight elevated.  Patient denies any chest pain.  EKG showed sinus rhythm with premature atrial complex and nonspecific T wave abnormality.  No concern for ACS at this time. -Continue to trend troponin. - Continue telemonitoring. -Patient has an echo 1 months ago no need to repeat it today.     Chronic atrial fibrillation Patient's CHA2DS2-VASc score is 3. -Per chart review when patient was last discharged from the hospital on 12/22/2022 plan was to hold to Eliquis until ERCP then resume it however patient reported that he never resumed his Eliquis as his oncologist recommended to hold it given he is going to have chemotherapy and may develop pancytopenia. -I did reach out to on-call cardiology Dr.Tannu, given he has CHA2DS2-VASc score is 3 he supposed to be on anticoagulation. - Appreciate oncology recommendations regarding need for continuation of Eliquis   Hypokalemia Hypocalcemia - Repeat potassium  supplementation and monitor in a.m. -Resume home Os-Cal. Low suspicions for tumor lysis syndrome -Continue home allopurinol 300 mg daily - Will continue to monitor electrolytes and replete as needed.    Acute kidney injury CKD stage 2 or 3A (baseline GFR in between 57-67) - Creatinine has been treated up 0.85-1.1 over the course of 2 days.  However patient's baseline creatinine around 1.   -Acute kidney injury secondary from in the setting of combined CHF exacerbation and sepsis. - Initially patient was on IV fluid for sepsis management but at the same time he has CHF exacerbation so IV fluid on hold and giving IV diuretics. - Continue to monitor urine output - Foley has been placed - Strict I's/O - Avoid nephrotoxic agent   History of gout - Continue allopurinol 300 mg daily   BPH -Patient reporting that every time he wants to go to bathroom to pee only few drops of urine comes out.  He has significant urinary urgency and hesitancy. -Obtaining bladder scan. - Resumed home Flomax 0.4 mg daily -Removed Foley due to dysuria   GERD - Resumed home Nexium 40 mg twice daily.  Dysuria Remove Foley catheter in place condom catheter Pyridium as needed No signs of UTI noted   Obesity BMI 34.59   DVT prophylaxis: Heparin drip currently held Code Status: Full Family Communication: Daughter at bedside 7/8 Disposition Plan:  Status is: Inpatient Remains inpatient appropriate because: Need for IV medications   Consultants:  GI Oncology Dr. Truett Perna  Procedures:  None  Antimicrobials:  Anti-infectives (From admission, onward)    Start     Dose/Rate Route Frequency Ordered Stop   01/10/23 2000  vancomycin (VANCOCIN) IVPB 1000 mg/200 mL premix        1,000 mg 200 mL/hr over 60 Minutes Intravenous Every 24 hours 01/09/23 1927 01/16/23 1959   01/10/23 0800  ceFEPIme (MAXIPIME) 2 g in sodium chloride 0.9 % 100 mL IVPB        2 g 200 mL/hr over 30 Minutes Intravenous Every 12 hours 01/09/23 1927 01/16/23 1959   01/10/23 0400  azithromycin (ZITHROMAX) 500 mg in sodium chloride 0.9 % 250 mL IVPB        500 mg 250 mL/hr over 60 Minutes Intravenous Every 24 hours 01/10/23 0101  01/15/23 0359   01/09/23 2300  vancomycin (VANCOCIN) IVPB 1000 mg/200 mL premix  Status:  Discontinued        1,000 mg 200 mL/hr over 60 Minutes Intravenous  Once 01/09/23 2246 01/09/23 2249   01/09/23 2300  ceFEPIme (MAXIPIME) 2 g in sodium chloride 0.9 % 100 mL IVPB  Status:  Discontinued        2 g 200 mL/hr over 30 Minutes Intravenous  Once 01/09/23 2246 01/09/23 2250   01/09/23 1830  vancomycin (VANCOREADY) IVPB 2000 mg/400 mL        2,000 mg 200 mL/hr over 120 Minutes Intravenous  Once 01/09/23 1815 01/09/23 2252   01/09/23 1815  ceFEPIme (MAXIPIME) 2 g in sodium chloride 0.9 % 100 mL IVPB        2 g 200 mL/hr over 30 Minutes Intravenous  Once 01/09/23 1812 01/09/23 2116   01/09/23 1815  metroNIDAZOLE (FLAGYL) IVPB 500 mg        500 mg 100 mL/hr over 60 Minutes Intravenous  Once 01/09/23 1812 01/09/23 2117   01/09/23 1815  vancomycin (VANCOCIN) IVPB 1000 mg/200 mL premix  Status:  Discontinued        1,000 mg 200  mL/hr over 60 Minutes Intravenous  Once 01/09/23 1812 01/09/23 1815       Subjective: Patient seen and evaluated today with significant dysuria and bladder pain noted.  Objective: Vitals:   01/09/23 2300 01/09/23 2356 01/10/23 0136 01/10/23 0625  BP: (!) 125/57  (!) 140/58 (!) 151/87  Pulse: 63 79 73 75  Resp: (!) 24 19 18 16   Temp:   99.3 F (37.4 C) 98.6 F (37 C)  TempSrc:   Oral   SpO2: 98% 100% 96% (!) 83%  Weight:   103.2 kg   Height:        Intake/Output Summary (Last 24 hours) at 01/10/2023 0637 Last data filed at 01/10/2023 0600 Gross per 24 hour  Intake 1060.42 ml  Output 200 ml  Net 860.42 ml   Filed Weights   01/09/23 1630 01/10/23 0136  Weight: 100 kg 103.2 kg    Examination:  General exam: Appears moderately distressed, port to right chest wall Respiratory system: Clear to auscultation. Respiratory effort normal. Cardiovascular system: S1 & S2 heard, RRR.  Gastrointestinal system: Abdomen is soft Central nervous system: Alert and  awake Extremities: No edema Skin: No significant lesions noted Psychiatry: Flat affect. Foley with clear, yellow urine output    Data Reviewed: I have personally reviewed following labs and imaging studies  CBC: Recent Labs  Lab 01/07/23 1159 01/09/23 1635 01/10/23 0144  WBC 4.7 5.0 3.9*  NEUTROABS 3.0 4.4  --   HGB 9.0* 8.9* 8.1*  HCT 27.7* 28.1* 24.9*  MCV 93.3 93.7 92.6  PLT 210 160 131*   Basic Metabolic Panel: Recent Labs  Lab 01/07/23 1159 01/09/23 1635 01/09/23 1809 01/10/23 0144  NA 142 135  --  137  K 3.6 3.3*  --  3.2*  CL 111 105  --  107  CO2 23 21*  --  20*  GLUCOSE 91 145*  --  147*  BUN 21 17  --  20  CREATININE 0.85 1.10  --  1.15  CALCIUM 9.0 7.9*  --  7.8*  MG  --   --  1.9 1.7  PHOS  --   --   --  2.1*   GFR: Estimated Creatinine Clearance: 56.7 mL/min (by C-G formula based on SCr of 1.15 mg/dL). Liver Function Tests: Recent Labs  Lab 01/07/23 1159 01/09/23 1635 01/10/23 0144  AST 18 26 21   ALT 21 20 22   ALKPHOS 168* 140* 117  BILITOT 1.2 1.5* 1.7*  PROT 5.8* 5.7* 5.3*  ALBUMIN 3.5 2.6* 2.2*   Recent Labs  Lab 01/09/23 1809  LIPASE 25   No results for input(s): "AMMONIA" in the last 168 hours. Coagulation Profile: Recent Labs  Lab 01/09/23 1635  INR 1.3*   Cardiac Enzymes: No results for input(s): "CKTOTAL", "CKMB", "CKMBINDEX", "TROPONINI" in the last 168 hours. BNP (last 3 results) No results for input(s): "PROBNP" in the last 8760 hours. HbA1C: No results for input(s): "HGBA1C" in the last 72 hours. CBG: No results for input(s): "GLUCAP" in the last 168 hours. Lipid Profile: No results for input(s): "CHOL", "HDL", "LDLCALC", "TRIG", "CHOLHDL", "LDLDIRECT" in the last 72 hours. Thyroid Function Tests: No results for input(s): "TSH", "T4TOTAL", "FREET4", "T3FREE", "THYROIDAB" in the last 72 hours. Anemia Panel: No results for input(s): "VITAMINB12", "FOLATE", "FERRITIN", "TIBC", "IRON", "RETICCTPCT" in the last 72  hours. Sepsis Labs: Recent Labs  Lab 01/09/23 1635 01/09/23 1835 01/10/23 0144  PROCALCITON  --   --  0.30  LATICACIDVEN 1.5 0.9  --  Recent Results (from the past 240 hour(s))  Respiratory (~20 pathogens) panel by PCR     Status: None   Collection Time: 01/09/23  6:13 PM   Specimen: Nasopharyngeal Swab; Respiratory  Result Value Ref Range Status   Adenovirus NOT DETECTED NOT DETECTED Final   Coronavirus 229E NOT DETECTED NOT DETECTED Final    Comment: (NOTE) The Coronavirus on the Respiratory Panel, DOES NOT test for the novel  Coronavirus (2019 nCoV)    Coronavirus HKU1 NOT DETECTED NOT DETECTED Final   Coronavirus NL63 NOT DETECTED NOT DETECTED Final   Coronavirus OC43 NOT DETECTED NOT DETECTED Final   Metapneumovirus NOT DETECTED NOT DETECTED Final   Rhinovirus / Enterovirus NOT DETECTED NOT DETECTED Final   Influenza A NOT DETECTED NOT DETECTED Final   Influenza B NOT DETECTED NOT DETECTED Final   Parainfluenza Virus 1 NOT DETECTED NOT DETECTED Final   Parainfluenza Virus 2 NOT DETECTED NOT DETECTED Final   Parainfluenza Virus 3 NOT DETECTED NOT DETECTED Final   Parainfluenza Virus 4 NOT DETECTED NOT DETECTED Final   Respiratory Syncytial Virus NOT DETECTED NOT DETECTED Final   Bordetella pertussis NOT DETECTED NOT DETECTED Final   Bordetella Parapertussis NOT DETECTED NOT DETECTED Final   Chlamydophila pneumoniae NOT DETECTED NOT DETECTED Final   Mycoplasma pneumoniae NOT DETECTED NOT DETECTED Final    Comment: Performed at [facility], [address], [place], [place] [address]         Radiology Studies: US Abdomen Limited  Result Date: 01/09/2023 CLINICAL DATA:  Recent diagnosis of pancreatic cancer, presenting with fever. EXAM: ULTRASOUND ABDOMEN LIMITED RIGHT UPPER QUADRANT COMPARISON:  None Available. FINDINGS: Gallbladder: A moderate amount of echogenic sludge is seen within the lumen of a distended gallbladder (approximately 12.1 cm in length). No  gallstones are identified. The gallbladder wall measures 5.6 mm in thickness. No sonographic Murphy sign noted by sonographer. Common bile duct: Diameter: 6.5 mm Liver: A 1.9 cm x 1.4 cm x 1.3 cm cyst is seen within the left lobe of the liver. Multiple, ill-defined hyperechoic foci are seen scattered throughout the liver parenchyma. Portal vein is patent on color Doppler imaging with normal direction of blood flow towards the liver. Other: None. IMPRESSION: 1. Gallbladder sludge within a markedly distended gallbladder. 2. Multiple echogenic liver lesions which may represent sequelae associated with hepatic metastasis. 3. Simple left renal cyst. Electronically Signed   By: Aram Candela M.D.   On: 01/09/2023 22:25   CT Angio Chest PE W and/or Wo Contrast  Result Date: 01/09/2023 CLINICAL DATA:  PE suspected, metastatic pancreatic cancer * Tracking Code: BO * EXAM: CT ANGIOGRAPHY CHEST WITH CONTRAST TECHNIQUE: Multidetector CT imaging of the chest was performed using the standard protocol during bolus administration of intravenous contrast. Multiplanar CT image reconstructions and MIPs were obtained to evaluate the vascular anatomy. RADIATION DOSE REDUCTION: This exam was performed according to the departmental dose-optimization program which includes automated exposure control, adjustment of the mA and/or kV according to patient size and/or use of iterative reconstruction technique. CONTRAST:  75mL OMNIPAQUE IOHEXOL 350 MG/ML SOLN COMPARISON:  09/27/2019 FINDINGS: Cardiovascular: Satisfactory opacification of the pulmonary arteries to the segmental level. No evidence of pulmonary embolism. Mild cardiomegaly. Left and right coronary artery calcifications. Left chest multi lead pacer defibrillator. No pericardial effusion. Aortic atherosclerosis. Right chest port catheter. Mediastinum/Nodes: No enlarged mediastinal, hilar, or axillary lymph nodes. Thyroid gland, trachea, and esophagus demonstrate no significant  findings. Lungs/Pleura: Small bilateral pleural effusions and associated atelectasis  or consolidation. Acute appearing, heterogeneous airspace opacity of the superior segment left lower lobe (series 4, image 62). Upper Abdomen: Please see separately reported examination of the abdomen and pelvis Musculoskeletal: No chest wall abnormality. No acute osseous findings. Disc degenerative disease and bridging osteophytosis throughout the thoracic spine, in keeping with DISH. Review of the MIP images confirms the above findings. IMPRESSION: 1. Negative examination for pulmonary embolism. 2. Small bilateral pleural effusions and associated atelectasis or consolidation. 3. Acute appearing, heterogeneous airspace opacity of the superior segment left lower lobe, consistent with infection or aspiration. 4. Coronary artery disease. Aortic Atherosclerosis (ICD10-I70.0). Electronically Signed   By: Jearld Lesch M.D.   On: 01/09/2023 21:09   CT ABDOMEN PELVIS W CONTRAST  Result Date: 01/09/2023 CLINICAL DATA:  Abdominal pain, pancreatic mass, * Tracking Code: BO * EXAM: CT ABDOMEN AND PELVIS WITH CONTRAST TECHNIQUE: Multidetector CT imaging of the abdomen and pelvis was performed using the standard protocol following bolus administration of intravenous contrast. RADIATION DOSE REDUCTION: This exam was performed according to the departmental dose-optimization program which includes automated exposure control, adjustment of the mA and/or kV according to patient size and/or use of iterative reconstruction technique. CONTRAST:  75mL OMNIPAQUE IOHEXOL 350 MG/ML SOLN COMPARISON:  CT abdomen pelvis, 12/03/2022 FINDINGS: Lower chest: Please see separately reported examination of the chest Hepatobiliary: Multiple low-attenuation liver lesions are unchanged, the largest of which are clearly benign fluid attenuation cysts, others more ill-defined and incompletely characterized, although remains suspicious for small metastases. Distended  gallbladder mild gallbladder wall thickening and adjacent pericholecystic fat stranding (series 5, image 43). Sludge in the gallbladder fundus (series 9, image 78). Common bile duct stent has been placed, with diminished, although persistent intrahepatic biliary ductal dilatation (series 5, image 31, series 8, image 74). Pancreas: Similar appearance of ill-defined hypodense mass in the superior pancreatic head measuring 2.7 x 2.7 cm (series 5, image 37). Mild atrophy of the distal pancreas with prominence of the pancreatic duct measuring up to 0.5 cm, unchanged (series 5, image 35). Spleen: Normal in size without significant abnormality. Adrenals/Urinary Tract: Adrenal glands are unremarkable. Simple, benign bilateral renal cortical cysts, for which no further follow-up or characterization is required. Kidneys are normal, without renal calculi, solid lesion, or hydronephrosis. Bladder is unremarkable. Stomach/Bowel: Status post Roux type gastric bypass. Appendix appears normal. No evidence of bowel wall thickening, distention, or inflammatory changes. Status post sigmoid colon resection and reanastomosis. Vascular/Lymphatic: Aortic atherosclerosis. No enlarged abdominal or pelvic lymph nodes. Reproductive: Prostate brachytherapy. Other: Mesh repair of broad-based midline ventral hernia. Anasarca. No ascites. Musculoskeletal: No acute or significant osseous findings. IMPRESSION: 1. Distended gallbladder with mild gallbladder wall thickening and adjacent pericholecystic fat stranding. Sludge in the gallbladder fundus. Findings are concerning for acute cholecystitis. 2. Common bile duct stent has been placed, with diminished, although persistent intrahepatic biliary ductal dilatation. 3. Similar appearance of ill-defined hypodense mass in the superior pancreatic head, consistent with known pancreatic adenocarcinoma. Mild atrophy of the distal pancreas with prominence of the pancreatic duct measuring up to 0.5 cm,  unchanged. 4. Multiple low-attenuation liver lesions are unchanged, the largest of which are clearly benign fluid attenuation cysts, others more ill-defined and incompletely characterized, although in keeping with small metastases. 5. Status post Roux type gastric bypass. 6. Status post sigmoid colon resection and reanastomosis. 7. Prostate brachytherapy. Aortic Atherosclerosis (ICD10-I70.0). Electronically Signed   By: Jearld Lesch M.D.   On: 01/09/2023 21:05   DG Chest 2 View  Result Date: 01/09/2023 CLINICAL DATA:  Suspected sepsis EXAM: CHEST - 2 VIEW COMPARISON:  Radiograph 11/24/2022 FINDINGS: LEFT-sided pacer overlies normal cardiac silhouette. Port in the anterior chest wall with tip in distal SVC. Elevation LEFT hemidiaphragm. No effusion, infiltrate or pneumothorax. Hyperinflated lungs. IMPRESSION: 1. No acute cardiopulmonary process. 2. Hyperinflated lungs suggest COPD. Electronically Signed   By: Genevive Bi M.D.   On: 01/09/2023 17:28        Scheduled Meds:  allopurinol  300 mg Oral Daily   calcium-vitamin D  1 tablet Oral Daily   Chlorhexidine Gluconate Cloth  6 each Topical Daily   furosemide  20 mg Intravenous BID   guaiFENesin  600 mg Oral BID   ipratropium-albuterol  3 mL Nebulization Q6H   lidocaine  1 Application Urethral Once   melatonin  10 mg Oral QHS   multivitamin with minerals  1 tablet Oral q AM   pantoprazole  40 mg Oral Daily   tamsulosin  0.4 mg Oral Daily   Continuous Infusions:  azithromycin 500 mg (01/10/23 0440)   ceFEPime (MAXIPIME) IV     heparin 1,400 Units/hr (01/10/23 0142)   vancomycin       LOS: 1 day    Time spent: 35 minutes    Sulaiman Imbert Hoover Brunette, DO [facility]  If 7PM-7AM, please contact night-coverage [email or web address] 01/10/2023, 6:37 AM

## 2023-01-10 NOTE — Consult Note (Signed)
Consultation  Referring Provider: TRH/Sundil Primary Care Physician:  [name], NP Primary Gastroenterologist:  Dr.Gessner  Reason for Consultation:   fever, SOB , LE edema  HPI: Marcus [name] is a 82 y.o. male who was admitted yesterday afternoon after he developed fever to 102 at home.  He says he has been feeling very weak over the past several days and has gradually developed lower extremity edema and shortness of breath over the past couple weeks.  He has a new diagnosis of pancreatic adenocarcinoma with biliary obstruction with diagnosis made in mid June 2000 Presented with relatively painless jaundice.  He underwent ERCP with plastic stent placement to the CBD on 12/13/2022.  And was noted gastric stenosis from prior stapling, cannot entertain duodenum initially, exchanged for gastroscope.  5 French 10 cm stent placed. He had EUS and FNA done per Dr. Irish Lack Roddy: 12/23/2022 he was noted to have 7 cratered distal esophageal ulcers nonbleeding largest 18 mm and esophagitis, this to have a T2 a N0 lesion with invasion of the SMV.,  He had evidence of prior gastric gastrostomy friable mucosa no malignant appearing lymph nodes noted was extensive hyperechoic material noted 8 cm gallbladder with sludge, noted for cystic lesions within the visualized portion of the liver measuring 8 mm x 9 mm.  Path was consistent with adenocarcinoma.  His first session of chemotherapy on Friday, 01/07/2023.  Labs on arrival yesterday WBC of 5.0/hemoglobin 8.9 Today WBC 3.9/hemoglobin 8.1/hematocrit 24/platelets 130 Potassium 3.2/BUN 20/creatinine 1.15 T. bili 1.7/alk phos 117/AST 21/22 BNP 282  CTA chest yesterday showed small bilateral effusions acute airspace disease in the left lower lobe distant with pneumonia CT of the abdomen pelvis shows prior Roux-en-Y, multiple unchanged liver lesions largest appear benign and others are ill-defined and suspicious for metastatic disease, there is a distended  gallbladder with mild wall thickening and adjacent fat stranding sludge but no stones noted, CBD stent in place with some mild persistent intrahepatic ductal dilation, pancreatic mass appears similar at 2.7 x 2.8 cm. Subsequent ultrasound shows gallbladder sludge no stones gallbladder wall thickening to 5.6 mm in CBD of 6.5 mm.  Noted markedly distended gallbladder  Max 99.3 today, hemodynamically stable, sitting up in chair Denies any abdominal pain and states he has not had any abdominal pain over the past week, no nausea or vomiting, did have some mild diarrhea during the night.  He feels exhausted, no sleep.  History of colon cancer patient is post right colectomy 1989, history of prostate cancer seed Implants 2020, COPD, hypertension, A-fib not currently on anticoagulation, sleep apnea.   Past Medical History:  Diagnosis Date   Acute meniscal tear of knee left   Arthritis    generalized   AV block 08/01/2018   Benign essential hypertension 01/18/2007   Bronchiectasis with acute exacerbation 06/23/2020   Chest pain, atypical 08/23/2014   Chronic cough 10/07/2014   Coronary artery disease    Degeneration of lumbar intervertebral disc 10/14/2017   Diverticulosis of colon 07/07/2005   Elevated PSA 06/19/2014   GERD (gastroesophageal reflux disease) 01/18/2007   Gout, unspecified 01/18/2007   Hemorrhoids 01/19/2011   Hiatal hernia    History of colonic polyps 01/18/2007   Insomnia, unspecified 08/21/2007   Iron deficiency anemia, unspecified  01/19/2011   Lumbar post-laminectomy syndrome 10/14/2017   Lumbar spondylolysis    Lumbar strain 12/14/2011   Obstructive sleep apnea 01/18/2007   uses CPAP nightly   Paraesophageal hernia    large   Primary  osteoarthritis of knee 05/23/2015   Prostate cancer (HCC) 2016   S/P knee replacement 05/23/2015   Sensorineural hearing loss, bilateral    Vitamin B12 deficiency    Vitamin D deficiency 10/28/2009    Past Surgical History:   Procedure Laterality Date   BILIARY BRUSHING  12/13/2022   Procedure: BILIARY BRUSHING;  Surgeon: Iva Boop, MD;  Location: [facility] ENDOSCOPY;  Service: Gastroenterology;;   BILIARY STENT PLACEMENT  12/13/2022   Procedure: BILIARY STENT PLACEMENT;  Surgeon: Iva Boop, MD;  Location: [facility] ENDOSCOPY;  Service: Gastroenterology;;   BIOPSY  12/23/2022   Procedure: BIOPSY;  Surgeon: Lemar Lofty., MD;  Location: [facility] ENDOSCOPY;  Service: Gastroenterology;;   BRONCHIAL WASHINGS  10/30/2019   Procedure: BRONCHIAL WASHINGS;  Surgeon: [name], DO;  Location: [facility] ENDOSCOPY;  Service: Endoscopy;;   CARDIAC CATHETERIZATION     CATARACT EXTRACTION W/ INTRAOCULAR LENS  IMPLANT, BILATERAL Bilateral    COLONOSCOPY     ERCP N/A 12/13/2022   Procedure: ENDOSCOPIC RETROGRADE CHOLANGIOPANCREATOGRAPHY (ERCP);  Surgeon: Iva Boop, MD;  Location: [facility] ENDOSCOPY;  Service: Gastroenterology;  Laterality: N/A;  with stent placement   ESOPHAGOGASTRODUODENOSCOPY     ESOPHAGOGASTRODUODENOSCOPY (EGD) WITH PROPOFOL N/A 12/23/2022   Procedure: ESOPHAGOGASTRODUODENOSCOPY (EGD) WITH PROPOFOL;  Surgeon: Meridee Score Netty Starring., MD;  Location: [facility] ENDOSCOPY;  Service: Gastroenterology;  Laterality: N/A;   EUS N/A 12/23/2022   Procedure: UPPER ENDOSCOPIC ULTRASOUND (EUS) RADIAL;  Surgeon: Lemar Lofty., MD;  Location: [facility] ENDOSCOPY;  Service: Gastroenterology;  Laterality: N/A;   FINE NEEDLE ASPIRATION  12/23/2022   Procedure: FINE NEEDLE ASPIRATION (FNA) RADIAL;  Surgeon: Lemar Lofty., MD;  Location: [facility] ENDOSCOPY;  Service: Gastroenterology;;   IR IMAGING GUIDED PORT INSERTION  01/04/2023   KNEE ARTHROSCOPY Right 2007   KNEE ARTHROSCOPY  07/13/2011   Procedure: ARTHROSCOPY KNEE;  Surgeon: Erasmo Leventhal;  Location: [facility];  Service: Orthopedics;  Laterality: Left;  partial menisectomy with chondrylplasty   KNEE SURGERY Left    LAMINECTOMY AND MICRODISCECTOMY LUMBAR  SPINE  MARCH  2008   L3 -  4   LAPAROSCOPIC INCISIONAL / UMBILICAL / VENTRAL HERNIA REPAIR  2006   PACEMAKER IMPLANT N/A 08/02/2018   Procedure: PACEMAKER IMPLANT;  Surgeon: Marinus Maw, MD;  Location: [facility]IVE [facility];  Service: Cardiovascular;  Laterality: N/A;   PROSTATE BIOPSY     RADIOACTIVE SEED IMPLANT N/A 02/12/2019   Procedure: RADIOACTIVE SEED IMPLANT/BRACHYTHERAPY IMPLANT;  Surgeon: Marcine Matar, MD;  Location: [facility] ORS;  Service: Urology;  Laterality: N/A;  90 MINS   SHOULDER ARTHROSCOPY Right 05-23-2007   SIGMOID COLECTOMY FOR CANCER  1989   SPACE OAR INSTILLATION N/A 02/12/2019   Procedure: SPACE OAR INSTILLATION;  Surgeon: Marcine Matar, MD;  Location: [facility] ORS;  Service: Urology;  Laterality: N/A;   SPHINCTEROTOMY  12/13/2022   Procedure: SPHINCTEROTOMY;  Surgeon: Iva Boop, MD;  Location: [facility] ENDOSCOPY;  Service: Gastroenterology;;   TOTAL KNEE ARTHROPLASTY Left 05/23/2015   Procedure: LEFT TOTAL KNEE ARTHROPLASTY;  Surgeon: Eugenia Mcalpine, MD;  Location: [facility] ORS;  Service: Orthopedics;  Laterality: Left;   VERTICAL BANDED GASTROPLASTY  1986   VIDEO BRONCHOSCOPY N/A 10/30/2019   Procedure: VIDEO BRONCHOSCOPY WITH BRONICAL ALVEROLAR LAVAGE WITHOUT FLUORO;  Surgeon: [name], DO;  Location: [facility] ENDOSCOPY;  Service: Endoscopy;  Laterality: N/A;    Prior to Admission medications   Medication Sig Start Date End Date Taking? Authorizing Provider  allopurinol (ZYLOPRIM) 300 MG tablet Take 1  tablet (300 mg total) by mouth daily. 05/25/22  Yes Nafziger, Kandee Keen, NP  amLODipine (NORVASC) 10 MG tablet Take 1 tablet (10 mg total) by mouth daily. 05/25/22  Yes Nafziger, Kandee Keen, NP  Calcium Carb-Cholecalciferol (CALCIUM 600 + D PO) Take 1 tablet by mouth daily. 800 units of vitamin D   Yes [provider]  hydrOXYzine (ATARAX) 50 MG tablet Take 1 tablet (50 mg total) by mouth 3 (three) times daily as needed. Patient taking differently: Take 50 mg by mouth 3 (three)  times daily as needed for itching. 12/23/22 03/23/23 Yes Nafziger, Kandee Keen, NP  Multiple Vitamin (MULTIVITAMIN) tablet Take 1 tablet by mouth in the morning.   Yes [provider]  polyethylene glycol powder (GLYCOLAX/MIRALAX) 17 GM/SCOOP powder Take 17g (1 capful) by mouth as directed daily as needed for mild constipation. 12/15/22  Yes Rolly Salter, MD  sodium chloride HYPERTONIC 3 % nebulizer solution Take by nebulization as needed for other. Patient taking differently: Take 4 mLs by nebulization as needed for other. 04/01/22  Yes [name], NP  sucralfate (CARAFATE) 1 GM/10ML suspension Take 10 mLs (1 g total) by mouth 2 (two) times daily. 12/23/22 12/23/23 Yes Mansouraty, Netty Starring., MD  tamsulosin (FLOMAX) 0.4 MG CAPS capsule Take 0.4 mg by mouth daily. 03/04/16  Yes [provider]  apixaban (ELIQUIS) 5 MG TABS tablet Take 1 tablet (5 mg total) by mouth 2 (two) times daily. Patient not taking: Reported on 01/09/2023 12/25/22   Mansouraty, Netty Starring., MD  esomeprazole (NEXIUM) 40 MG capsule Take 1 capsule (40 mg total) by mouth 2 (two) times daily before a meal. Patient not taking: Reported on 01/09/2023 12/23/22 12/23/23  Mansouraty, Netty Starring., MD  lidocaine-prilocaine (EMLA) cream Apply 1 Application topically as needed. Patient not taking: Reported on 01/09/2023 12/31/22   [name], MD  ondansetron (ZOFRAN) 8 MG tablet Take 1 tablet (8 mg total) by mouth every 8 (eight) hours as needed. Patient not taking: Reported on 01/09/2023 12/31/22   [name], MD  prochlorperazine (COMPAZINE) 10 MG tablet Take 1 tablet (10 mg total) by mouth every 6 (six) hours as needed for nausea or vomiting. Patient not taking: Reported on 01/09/2023 12/31/22   [name], MD  Respiratory Therapy Supplies (FLUTTER) DEVI 1 each by Does not apply route in the morning and at bedtime. 10/11/19   [name], DO    Current Facility-Administered Medications  Medication Dose Route  Frequency Provider Last Rate Last Admin   acetaminophen (TYLENOL) tablet 650 mg  650 mg Oral Q6H PRN Janalyn Shy, Subrina, MD   650 mg at 01/10/23 1914   Or   acetaminophen (TYLENOL) suppository 650 mg  650 mg Rectal Q6H PRN Janalyn Shy, Subrina, MD       allopurinol (ZYLOPRIM) tablet 300 mg  300 mg Oral Daily Sundil, Subrina, MD   300 mg at 01/10/23 0910   azithromycin (ZITHROMAX) 500 mg in sodium chloride 0.9 % 250 mL IVPB  500 mg Intravenous Q24H Sundil, Subrina, MD 250 mL/hr at 01/10/23 0440 500 mg at 01/10/23 0440   calcium-vitamin D (OSCAL WITH D) 500-5 MG-MCG per tablet 1 tablet  1 tablet Oral Daily Sundil, Subrina, MD   1 tablet at 01/10/23 0911   ceFEPIme (MAXIPIME) 2 g in sodium chloride 0.9 % 100 mL IVPB  2 g Intravenous Q12H Tereasa Coop, MD   Stopped at 01/10/23 0841   Chlorhexidine Gluconate Cloth 2 % PADS 6 each  6 each Topical  Daily Sundil, Subrina, MD       cyclobenzaprine (FLEXERIL) tablet 5 mg  5 mg Oral TID PRN Janalyn Shy, Subrina, MD   5 mg at 01/10/23 0433   furosemide (LASIX) injection 20 mg  20 mg Intravenous BID Janalyn Shy, Subrina, MD   20 mg at 01/10/23 1610   guaiFENesin (MUCINEX) 12 hr tablet 600 mg  600 mg Oral BID Janalyn Shy, Subrina, MD   600 mg at 01/10/23 0910   hydrOXYzine (ATARAX) tablet 25 mg  25 mg Oral TID PRN Tereasa Coop, MD       ipratropium-albuterol (DUONEB) 0.5-2.5 (3) MG/3ML nebulizer solution 3 mL  3 mL Nebulization Q6H Sundil, Subrina, MD       levalbuterol Pauline Aus) nebulizer solution 0.63 mg  0.63 mg Nebulization Q6H PRN Janalyn Shy, Subrina, MD       lidocaine (XYLOCAINE) 2 % jelly 1 Application  1 Application Urethral Once Sundil, Subrina, MD       melatonin tablet 10 mg  10 mg Oral QHS Sundil, Subrina, MD   10 mg at 01/10/23 0028   multivitamin with minerals tablet 1 tablet  1 tablet Oral q AM Janalyn Shy, Subrina, MD       ondansetron [facility]) tablet 4 mg  4 mg Oral Q6H PRN Janalyn Shy, Subrina, MD       Or   ondansetron [facility]) injection 4 mg  4 mg Intravenous Q6H PRN  Janalyn Shy, Subrina, MD       pantoprazole (PROTONIX) EC tablet 40 mg  40 mg Oral Daily Sundil, Subrina, MD   40 mg at 01/10/23 9604   phenazopyridine (PYRIDIUM) tablet 100 mg  100 mg Oral TID PRN Janalyn Shy, Subrina, MD   100 mg at 01/10/23 0910   potassium chloride 10 mEq in 100 mL IVPB  10 mEq Intravenous Q1 Hr x 4 Maurilio Lovely D, DO 100 mL/hr at 01/10/23 0928 10 mEq at 01/10/23 5409   sodium chloride flush (NS) 0.9 % injection 10-40 mL  10-40 mL Intracatheter PRN Janalyn Shy, Subrina, MD       tamsulosin [facility]) capsule 0.4 mg  0.4 mg Oral Daily Sundil, Subrina, MD   0.4 mg at 01/10/23 0911   vancomycin (VANCOCIN) IVPB 1000 mg/200 mL premix  1,000 mg Intravenous Q24H Sundil, Subrina, MD        Allergies as of 01/09/2023 - Review Complete 01/09/2023  Allergen Reaction Noted   Contrast media [iodinated contrast media] Palpitations 07/09/2011    Family History  Problem Relation Age of Onset   Stroke Paternal Grandfather    Heart disease Paternal Grandfather    Esophagitis Father        died from perforated esophagus   Breast cancer Mother    Colon cancer Maternal Grandmother        questionable   Esophageal cancer Neg Hx    Prostate cancer Neg Hx    Stomach cancer Neg Hx     Social History   Socioeconomic History   Marital status: Widowed    Spouse name: Not on file   Number of children: 4   Years of education: 67   Highest education level: Some college, no degree  Occupational History   Occupation: retired    Associate Professor: [employer]  Tobacco Use   Smoking status: Former    Packs/day: 2.00    Years: 20.00    Additional pack years: 0.00    Total pack years: 40.00    Types: Cigarettes    Quit date: 07/05/1974    Years  since quitting: 48.5   Smokeless tobacco: Never  Vaping Use   Vaping Use: Never used  Substance and Sexual Activity   Alcohol use: Not Currently    Alcohol/week: 30.0 standard drinks of alcohol    Types: 30 Standard drinks or equivalent per week    Comment:  4-5 glasses of wine or 2-3 beers nightly   Drug use: No   Sexual activity: Not Currently  Other Topics Concern   Not on file  Social History Narrative   Recently widowed   Former Smoker    Alcohol use-yes 1-2 drinks per day      Occupation: Retired Associate Professor      Originally from [place], [place] - in [place] > 10 yrs as of 2017      Social Determinants of Health   Financial Resource Strain: Low Risk  (07/28/2021)   Overall Financial Resource Strain (CARDIA)    Difficulty of Paying Living Expenses: Not hard at all  Food Insecurity: No Food Insecurity (01/10/2023)   Hunger Vital Sign    Worried About Running Out of Food in the Last Year: Never true    Ran Out of Food in the Last Year: Never true  Transportation Needs: No Transportation Needs (01/10/2023)   PRAPARE - Administrator, Civil Service (Medical): No    Lack of Transportation (Non-Medical): No  Physical Activity: Inactive (07/28/2021)   Exercise Vital Sign    Days of Exercise per Week: 0 days    Minutes of Exercise per Session: 0 min  Stress: No Stress Concern Present (07/28/2021)   [employer] of Occupational Health - Occupational Stress Questionnaire    Feeling of Stress : Not at all  Social Connections: Moderately Isolated (08/04/2020)   Social Connection and Isolation Panel [NHANES]    Frequency of Communication with Friends and Family: Three times a week    Frequency of Social Gatherings with Friends and Family: More than three times a week    Attends Religious Services: More than 4 times per year    Active Member of Clubs or Organizations: No    Attends Banker Meetings: Never    Marital Status: Widowed  Intimate Partner Violence: Not At Risk (01/10/2023)   Humiliation, Afraid, Rape, and Kick questionnaire    Fear of Current or Ex-Partner: No    Emotionally Abused: No    Physically Abused: No    Sexually Abused: No    Review of Systems: Pertinent positive and negative review of systems were  noted in the above HPI section.  All other review of systems was otherwise negative.   Physical Exam: Vital signs in last 24 hours: Temp:  [98.5 F (36.9 C)-102.9 F (39.4 C)] 98.5 F (36.9 C) (07/08 0806) Pulse Rate:  [63-84] 80 (07/08 0806) Resp:  [13-27] 17 (07/08 0806) BP: (101-151)/(49-87) 128/66 (07/08 0806) SpO2:  [83 %-100 %] 97 % (07/08 0806) Weight:  [100 kg-103.2 kg] 103.2 kg (07/08 0136) Last BM Date : 01/10/23 General:   Alert,  Well-developed, well-nourished, chronically ill-appearing elderly white male, pleasant and cooperative in NAD-care Head:  Normocephalic and atraumatic. Eyes:  Sclera clear, no icterus.   Conjunctiva pink. Ears:  Normal auditory acuity. Nose:  No deformity, discharge,  or lesions. Mouth:  No deformity or lesions.   Neck:  Supple; no masses or thyromegaly. Lungs: Breath sounds and crackles in the left base  heart:  Regular rate and rhythm; no murmurs, clicks, rubs,  or gallops. Abdomen:  Soft, obese,  nontender, BS active,nonpalp mass or hsm.   Rectal: Not done Msk:  Symmetrical without gross deformities. . Pulses:  Normal pulses noted. Extremities: 1+ edema lower extremities bilaterally Neurologic:  Alert and  oriented x4;  grossly normal neurologically. Skin:  Intact without significant lesions or rashes.. Psych:  Alert and cooperative. Normal mood and affect.  Intake/Output from previous day: 07/07 0701 - 07/08 0700 In: 1060.4 [I.V.:59.9; IV Piggyback:1000.6] Out: 200 [Urine:200] Intake/Output this shift: Total I/O In: 128.7 [I.V.:28.7; IV Piggyback:100] Out: 1075 [Urine:1075]  Lab Results: Recent Labs    01/07/23 1159 01/09/23 1635 01/10/23 0144  WBC 4.7 5.0 3.9*  HGB 9.0* 8.9* 8.1*  HCT 27.7* 28.1* 24.9*  PLT 210 160 131*   BMET Recent Labs    01/07/23 1159 01/09/23 1635 01/10/23 0144  NA 142 135 137  K 3.6 3.3* 3.2*  CL 111 105 107  CO2 23 21* 20*  GLUCOSE 91 145* 147*  BUN 21 17 20   CREATININE 0.85 1.10 1.15   CALCIUM 9.0 7.9* 7.8*   LFT Recent Labs    01/10/23 0144  PROT 5.3*  ALBUMIN 2.2*  AST 21  ALT 22  ALKPHOS 117  BILITOT 1.7*   PT/INR Recent Labs    01/09/23 1635  LABPROT 16.0*  INR 1.3*     IMPRESSION:  #53 82 year old white male with new diagnosis of pancreatic adenocarcinoma with biliary obstruction diagnosis mid June 2024.  He is status post ERCP plastic stent placement on 12/13/2022.  He then underwent EUS and FNA on 02/22/2023 with T2 N0 lesion.  Patient initiated chemotherapy on 01/07/2023  Presented yesterday 01/09/2023 with fever to 102 at home, some shortness of breath he says has been feeling weak over the past several days some lower extremity edema, and sense of shortness of breath.  No complaints of abdominal pain no nausea or vomiting 1 episode of diarrhea last p.m.  Workup thus far is pertinent for a left lower lobe pneumonia, he has been started on broad-spectrum antibiotics.  Imaging with CT and ultrasound both show a distended gallbladder with gallbladder wall thickening and sludge but no stones, CBD of 6.5 mm, stent in place, with some mild persistent intrahepatic ductal dilation.  Clinically does not appear to have acute cholecystitis, has no complaints of abdominal pain or nausea, however CT and ultrasound are suggestive  It is possible he could have a nonobstructive cholangitis however LFTs are essentially normal  2 history of atrial fibrillation 3.  COPD 4.  Anemia chronic disease-has a mild pancytopenia likely chemo induced expect counts may continue to decline over the next few days 5 history of hypertension #6 sleep apnea  Plan; will ask biliary team to review his images, I do not think he has to have ERCP with stent exchange acutely. Will proceed with HIDA scan, rule out acute cholecystitis Continue broad-spectrum antibiotics, await blood cultures Trend labs GI will follow with you.         Ahriyah Vannest PA-C 01/10/2023, 10:03 AM

## 2023-01-10 NOTE — Progress Notes (Signed)
New Admission Note:   Arrival Method: stretcher Mental Orientation: alert x 4 Telemetry: box 14 Assessment: Completed Skin: see flowsheet IV: port a cath and nsl Pain: none Tubes: foley Safety Measures: Safety Fall Prevention Plan has been discussed Admission: Completed [facility] Orientation: Patient has been orientated to the room, unit and staff.  Family: daughter laura at bedside  Orders have been reviewed and implemented. Will continue to monitor the patient. Call light has been placed within reach and bed alarm has been activated.   Artemio Aly BSN, RN Phone number: [phone]

## 2023-01-10 NOTE — ED Notes (Signed)
IV team is accessing the pt's port and its delaying transport

## 2023-01-10 NOTE — Plan of Care (Signed)
  Problem: Respiratory: Goal: Ability to maintain adequate ventilation will improve Outcome: Progressing   Problem: Clinical Measurements: Goal: Respiratory complications will improve Outcome: Progressing Goal: Cardiovascular complication will be avoided Outcome: Progressing   Problem: Activity: Goal: Risk for activity intolerance will decrease Outcome: Progressing   Problem: Elimination: Goal: Will not experience complications related to urinary retention Outcome: Progressing

## 2023-01-10 NOTE — Telephone Encounter (Signed)
FIRST TIME FOLLOW UP CALL  Telephone call to patient post his first time Abraxane/Gemzar treatment on Friday 01/07/23. Unable to reach patient, left voice message.

## 2023-01-10 NOTE — Progress Notes (Signed)
Offered lidocaine patient said not now pain is easing off finally.

## 2023-01-10 NOTE — Progress Notes (Signed)
Foley removed per orders  patient refused condom catheter  using a urinal.

## 2023-01-11 DIAGNOSIS — J189 Pneumonia, unspecified organism: Secondary | ICD-10-CM

## 2023-01-11 DIAGNOSIS — I482 Chronic atrial fibrillation, unspecified: Secondary | ICD-10-CM

## 2023-01-11 DIAGNOSIS — C259 Malignant neoplasm of pancreas, unspecified: Secondary | ICD-10-CM

## 2023-01-11 DIAGNOSIS — A419 Sepsis, unspecified organism: Secondary | ICD-10-CM | POA: Diagnosis not present

## 2023-01-11 LAB — CBC WITH DIFFERENTIAL/PLATELET
Abs Immature Granulocytes: 0.01 10*3/uL (ref 0.00–0.07)
Basophils Absolute: 0 10*3/uL (ref 0.0–0.1)
Basophils Relative: 0 %
Eosinophils Absolute: 0.1 10*3/uL (ref 0.0–0.5)
Eosinophils Relative: 2 %
HCT: 23.3 % — ABNORMAL LOW (ref 39.0–52.0)
Hemoglobin: 7.5 g/dL — ABNORMAL LOW (ref 13.0–17.0)
Immature Granulocytes: 0 %
Lymphocytes Relative: 15 %
Lymphs Abs: 0.4 10*3/uL — ABNORMAL LOW (ref 0.7–4.0)
MCH: 29.9 pg (ref 26.0–34.0)
MCHC: 32.2 g/dL (ref 30.0–36.0)
MCV: 92.8 fL (ref 80.0–100.0)
Monocytes Absolute: 0.1 10*3/uL (ref 0.1–1.0)
Monocytes Relative: 3 %
Neutro Abs: 2.2 10*3/uL (ref 1.7–7.7)
Neutrophils Relative %: 80 %
Platelets: 126 10*3/uL — ABNORMAL LOW (ref 150–400)
RBC: 2.51 MIL/uL — ABNORMAL LOW (ref 4.22–5.81)
RDW: 15.4 % (ref 11.5–15.5)
WBC: 2.8 10*3/uL — ABNORMAL LOW (ref 4.0–10.5)
nRBC: 0 % (ref 0.0–0.2)

## 2023-01-11 LAB — COMPREHENSIVE METABOLIC PANEL
ALT: 22 U/L (ref 0–44)
AST: 23 U/L (ref 15–41)
Albumin: 2.1 g/dL — ABNORMAL LOW (ref 3.5–5.0)
Alkaline Phosphatase: 101 U/L (ref 38–126)
Anion gap: 7 (ref 5–15)
BUN: 20 mg/dL (ref 8–23)
CO2: 21 mmol/L — ABNORMAL LOW (ref 22–32)
Calcium: 7.6 mg/dL — ABNORMAL LOW (ref 8.9–10.3)
Chloride: 106 mmol/L (ref 98–111)
Creatinine, Ser: 1.17 mg/dL (ref 0.61–1.24)
GFR, Estimated: >60 mL/min (ref >60)
Glucose, Bld: 117 mg/dL — ABNORMAL HIGH (ref 70–99)
Potassium: 3.1 mmol/L — ABNORMAL LOW (ref 3.5–5.1)
Sodium: 134 mmol/L — ABNORMAL LOW (ref 135–145)
Total Bilirubin: 1.1 mg/dL (ref 0.3–1.2)
Total Protein: 5 g/dL — ABNORMAL LOW (ref 6.5–8.1)

## 2023-01-11 LAB — URINE CULTURE: Culture: NO GROWTH

## 2023-01-11 LAB — MAGNESIUM: Magnesium: 1.8 mg/dL (ref 1.7–2.4)

## 2023-01-11 LAB — HEPARIN LEVEL (UNFRACTIONATED): Heparin Unfractionated: <0.1 IU/mL — ABNORMAL LOW (ref 0.30–0.70)

## 2023-01-11 LAB — MRSA NEXT GEN BY PCR, NASAL: MRSA by PCR Next Gen: NOT DETECTED

## 2023-01-11 LAB — CALCIUM, IONIZED: Calcium, Ionized, Serum: 4.7 mg/dL (ref 4.5–5.6)

## 2023-01-11 MED ORDER — HEPARIN (PORCINE) 25000 UT/250ML-% IV SOLN
1400.0000 [IU]/h | INTRAVENOUS | Status: DC
  Administered 2023-01-11: 1400 [IU]/h via INTRAVENOUS

## 2023-01-11 MED ORDER — HEPARIN BOLUS VIA INFUSION
3000.0000 [IU] | Freq: Once | INTRAVENOUS | Status: AC
  Administered 2023-01-11: 3000 [IU] via INTRAVENOUS
  Filled 2023-01-11: qty 3000

## 2023-01-11 MED ORDER — HEPARIN BOLUS VIA INFUSION
3000.0000 [IU] | Freq: Once | INTRAVENOUS | Status: DC
  Filled 2023-01-11: qty 3000

## 2023-01-11 MED ORDER — POTASSIUM CHLORIDE CRYS ER 20 MEQ PO TBCR
40.0000 meq | EXTENDED_RELEASE_TABLET | Freq: Two times a day (BID) | ORAL | Status: AC
  Administered 2023-01-11 (×2): 40 meq via ORAL
  Filled 2023-01-11 (×2): qty 2

## 2023-01-11 MED ORDER — PHENAZOPYRIDINE HCL 200 MG PO TABS: Freq: Once | ORAL | Status: DC

## 2023-01-11 MED ORDER — APIXABAN 5 MG PO TABS
5.0000 mg | ORAL_TABLET | Freq: Two times a day (BID) | ORAL | Status: DC
  Administered 2023-01-11 – 2023-01-13 (×5): 5 mg via ORAL
  Filled 2023-01-11 (×5): qty 1

## 2023-01-11 MED ORDER — PHENAZOPYRIDINE HCL 200 MG PO TABS
200.0000 mg | ORAL_TABLET | Freq: Three times a day (TID) | ORAL | Status: DC
  Administered 2023-01-11 – 2023-01-13 (×8): 200 mg via ORAL
  Filled 2023-01-11 (×9): qty 1

## 2023-01-11 NOTE — TOC Initial Note (Signed)
Transition of Care [facility]) - Initial/Assessment Note    Patient Details  Name: Marcus [name] MRN: [id number] Date of Birth: 1940-12-09  Transition of Care [facility]) CM/SW Contact:    Tom-Johnson, Hershal Coria, RN Phone Number: 01/11/2023, 5:10 PM  Clinical Narrative:                  Patient presented to the ED with Fatigue, Generalized Weakness, Lower Extremity Edema, Exertional SOB anddifficulty Urinating.Patient recently diagnosed with Pancreatic Cancer an started Chemotherapy two days prior to admission. Patient has hx of Prostate Cancer. Patient admitted for Sepsis 2/2 CAP, on IV abx, had Foley cath placed but was c/o discomfort and cath removed.       Expected Discharge Plan: Home/Self Care Barriers to Discharge: Continued Medical Work up   Patient Goals and CMS Choice Patient states their goals for this hospitalization and ongoing recovery are:: To return home CMS Medicare.gov Compare Post Acute Care list provided to:: Patient Choice offered to / list presented to : NA      Expected Discharge Plan and Services   Discharge Planning Services: CM Consult Post Acute Care Choice: NA Living arrangements for the past 2 months: Single Family Home                                      Prior Living Arrangements/Services Living arrangements for the past 2 months: Single Family Home Lives with:: Adult Children (Son, [place]) Patient language and need for interpreter reviewed:: Yes Do you feel safe going back to the place where you live?: Yes      Need for Family Participation in Patient Care: Yes (Comment) Care giver support system in place?: Yes (comment) Current home services: DME (Cane, walker, shower seat, grab bars.) Criminal Activity/Legal Involvement Pertinent to Current Situation/Hospitalization: No - Comment as needed  Activities of Daily Living Home Assistive Devices/Equipment: Cane (specify quad or straight), Eyeglasses, Scales, Shower chair without back, Grab  bars around toilet, Built-in shower seat ADL Screening (condition at time of admission) Patient's cognitive ability adequate to safely complete daily activities?: Yes Is the patient deaf or have difficulty hearing?: No Does the patient have difficulty seeing, even when wearing glasses/contacts?: No Does the patient have difficulty concentrating, remembering, or making decisions?: No Patient able to express need for assistance with ADLs?: Yes Does the patient have difficulty dressing or bathing?: No Independently performs ADLs?: Yes (appropriate for developmental age) Does the patient have difficulty walking or climbing stairs?: Yes Weakness of Legs: Both Weakness of Arms/Hands: None  Permission Sought/Granted Permission sought to share information with : Case Manager, Family Supports Permission granted to share information with : Yes, Verbal Permission Granted              Emotional Assessment Appearance:: Appears stated age Attitude/Demeanor/Rapport: Engaged, Gracious Affect (typically observed): Accepting, Calm, Hopeful, Pleasant Orientation: : Oriented to Self, Oriented to Place, Oriented to  Time, Oriented to Situation Alcohol / Substance Use: Not Applicable Psych Involvement: No (comment)  Admission diagnosis:  Sepsis (HCC) [A41.9] Pneumonia of left lower lobe due to infectious organism [J18.9] Patient Active Problem List   Diagnosis Date Noted   AKI (acute kidney injury) (HCC) 01/10/2023   Sepsis secondary from pneumonia and acute cholecystitis  (HCC) 01/09/2023   Pneumonia of left lower lobe due to infectious organism 01/09/2023   CHF exacerbation (HCC) 01/09/2023   Combined systolic and diastolic heart failure (HCC)  01/09/2023   COPD with acute exacerbation (HCC) 01/09/2023   Acute cholecystitis 01/09/2023   Hypokalemia 01/09/2023   History of biliary duct stent placement 01/09/2023   Pruritus 01/09/2023   Pancreatic adenocarcinoma (HCC) 12/29/2022   Malnutrition  of moderate degree 12/15/2022   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) 12/10/2022   Pancreatic mass 12/10/2022   Status post bariatric surgery 12/10/2022   Elevated LFTs 12/10/2022   Atrial fibrillation, chronic (HCC) 10/11/2022   Long term (current) use of anticoagulants 10/11/2022   PND (post-nasal drip) 05/03/2022   Bronchiectasis without complication (HCC) 04/03/2022   Vitamin B12 deficiency 12/25/2020   Sensorineural hearing loss, bilateral    Cardiac pacemaker in situ 03/26/2020   AV block 08/01/2018   Degeneration of lumbar intervertebral disc 10/14/2017   Lumbar post-laminectomy syndrome 10/14/2017   Primary osteoarthritis of knee 05/23/2015   S/P knee replacement 05/23/2015   Hemorrhoids 01/19/2011   Vitamin D deficiency 10/28/2009   Obesity 10/17/2007   Gout 01/18/2007   Obstructive sleep apnea 01/18/2007   Benign essential hypertension 01/18/2007   GERD (gastroesophageal reflux disease) 01/18/2007   History of malignant neoplasm of colon 01/18/2007   History of colonic polyps 01/18/2007   Hiatal hernia 10/26/2006   Diverticulosis of colon 07/07/2005   PCP:  [name], NP Pharmacy:   [id number] - [place], [place] - [address] [place] [address] Phone: [phone] Fax: [phone]     Social Determinants of Health (SDOH) Social History: SDOH Screenings   Food Insecurity: No Food Insecurity (01/10/2023)  Housing: Low Risk  (01/10/2023)  Transportation Needs: No Transportation Needs (01/10/2023)  Utilities: Not At Risk (01/10/2023)  Alcohol Screen: Low Risk  (08/04/2020)  Depression (PHQ2-9): High Risk (12/20/2022)  Financial Resource Strain: Low Risk  (07/28/2021)  Physical Activity: Inactive (07/28/2021)  Social Connections: Moderately Isolated (08/04/2020)  Stress: No Stress Concern Present (07/28/2021)  Tobacco Use: Medium Risk (01/10/2023)   SDOH Interventions: Transportation Interventions: Intervention Not  Indicated, Inpatient TOC, Patient Resources (Friends/Family)   Readmission Risk Interventions    01/11/2023    5:08 PM  Readmission Risk Prevention Plan  Transportation Screening Complete  Medication Review (RN Care Manager) Referral to Pharmacy  PCP or Specialist appointment within 3-5 days of discharge Complete  HRI or Home Care Consult Complete  SW Recovery Care/Counseling Consult Complete  Palliative Care Screening Not Applicable  Skilled Nursing Facility Not Applicable

## 2023-01-11 NOTE — Progress Notes (Signed)
PROGRESS NOTE    Marcus [name]  ZOX:[id number] DOB: [birth year] DOA: 01/09/2023 PCP: [name], NP   Brief Narrative:    This is a 82 year old man medical history of prostate cancer s/p radioactive seed implant in 2020, history of colon cancer status post right colectomy in 1989, COPD, hypertension, paroxysmal atrial fibrillation (not currently on Eliquis since last hospital discharge from 12/15/2022 for EGD and EUS biopsy), arthritis, gout, GERD, OSA, hyperlipidemia, chronic biliary ductal obstruction s/p stent placement 12/15/2022 due to malignant neoplasm, , recently diagnosed with pancreatic cancer 2 weeks ago and received first session of chemotherapy on 01/07/2023 presented with emergency department complaining of generalized weakness, lower extremity edema, and exertional shortness of breath.  Patient was admitted for sepsis secondary to community-acquired pneumonia as well as concerns for combined CHF exacerbation.  GI consulted for ERCP and bile duct exchange, but they had ordered HIDA scan 7/8 with no concerning findings and therefore did not require any procedures.  Diet has been resumed as well as Eliquis and heparin drip discontinued.  He continues to have some urethral pain after Foley insertion and has concerns for UTI as well as some mild Foley trauma.  He has been started on Pyridium for his discomfort.  Assessment & Plan:   Principal Problem:   Sepsis secondary from pneumonia and acute cholecystitis  (HCC) Active Problems:   CAP (community acquired pneumonia)   CHF exacerbation (HCC)   Combined systolic and diastolic heart failure (HCC)   COPD with acute exacerbation (HCC)   Acute cholecystitis   Gout   Atrial fibrillation, chronic (HCC)   Pancreatic adenocarcinoma (HCC)   Hypokalemia   History of biliary duct stent placement   Pruritus   AKI (acute kidney injury) (HCC)  Assessment and Plan:   Sepsis secondary from pneumonia Community-acquired pneumonia (patient is  immunocompromised and recent hospital admission concern for pneumonia associated with Staph aureus and pseudomonas) -Patient comes in with complaining of fever, chills, shortness of breath, sputum production over the course of last 3 days. -Initial presentation patient found febrile 102.9 degrees Fahrenheit, pressure 129/49 and heart rate 84.  WBC 5.  As patient has active cancer currently on chemotherapy leukocytosis is not evident.  Lactic acid 1.5 WNL. - Chest x-ray showed evidence of COPD. -CTA chest showed small bilateral pleural effusion, acute appearing heterogenous airspace opacity of the superior segment of the left lobe consistent with infection/aspiration. - Given patient is immunocompromised and recent hospital admission 3 weeks ago there is concern for stay for low and Pseudomonas associated pneumonia versus complete tach or pneumonia as well. -Chemo-Port site looks clean no concern for infection. -UA unremarkable. -Sepsis secondary from in the setting of pneumonia and acute cholecystitis. -In the ED patient was getting IV fluid LR 150 cc/h per sepsis protocol however physical exam showed evidence of lower extremity pitting edema 2+ and echo showed reduced EF less than 45% there is concern for CHF exacerbation as well.  So I have to stop the fluid. - Continue broad-spectrum antibiotic vancomycin and cefepime per pharmacy protocol.  Continue azithromycin for atypical coverage.  Anticipate will be able to discharge on oral antibiotics in the next 1-2 days. - Urine strep pneumonia negative and Legionella pending - Appears to be back on room air     CHF exacerbation Combined systolic and diastolic heart failure EF <45 to 50% -Patient is complaining about increasing sputum production, worsening bilateral lower extremity swelling and pitting edema.  He is also endorsing orthopnea, dyspnea and PND.  Physical exam showed bilateral lower extremity pitting edema 2+ -Per chart review echo from  12/14/2022 reduced EF 45 to 50% and grade 1 diastolic dysfunction. -As mentioned above patient was receiving IV fluid LR 150 cc/h for sepsis protocol however as physical exam and history evidence for CHF exacerbation I have to stop the IV fluid.  Patient's blood pressure is borderline soft. - Increase IV diuresis to 40 mg twice daily and monitor carefully for goal output of 2-3 L daily -Decreased fluid intake to 1500 mL daily - No need for repeat 2D echocardiogram  Fall -Patient fell overnight on 7/8 with no noted injuries on imaging -Currently on fall precautions -PT/OT evaluation ordered and pending   History of hypertension -At home patient is on amlodipine 10 mg daily.  Currently blood pressure is borderline soft in the setting of sepsis.  Also he has CHF exacerbation at the same time. -Continuing Lasix 20 mg IV twice daily for CHF exacerbation.       COPD exacerbation -Patient reported history of COPD but not any treatment at home? -Chest x-ray showed hypertension for short of the lung suggested COPD. - Patient reporting worsening shortness of breath and mucoid sputum production.  It can be COPD or CHF exacerbation or combined both. Also pneumonia can exacerbate the COPD as well. - Starting DuoNeb every 6 hours scheduled and Xopenex as needed. - In the setting of pneumonia and acute cholecystitis holding prednisone as it can exacerbate acute inflammatory state. -Starting azithromycin 500 mg daily for 5 days to control inflammation for COPD exacerbation and also covering atypical for CAP/ -Continue supplemental oxygen with O2 sat above 92% - Continue supplemental oxygen, incentive spirometry, flutter valve, and chest.  Respiratory.   Acute cholecystitis Biliary stenosis status post stent placement -CT abdomen pelvis showed Distended gallbladder with mild gallbladder wall thickening and adjacent pericholecystic fat stranding. Sludge in the gallbladder fundus. Findings are concerning  for acute cholecystitis.   Ultrasound similar finding. -Patient had new diagnosis of pancreatic head adenocarcinoma as mentioned above and recent hospital admission showed biliary duct stenosis status post stent placement on 12/15/2022. - Consulted with GI Dr.Meesna-recommended  hold the Eliquis - if need use heparin - I think he may need an ERCP and stent change,. From what I see I suppose cholecystitis is possible though not clear. We will see him in AM. In case it is possible to schedule a procedure hold NPO except meds in AM and would stop heparin at 0800 if used overnight. -Appreciate GI input and recommendation with plans for HIDA scan today   Pancreatic head adenocarcinoma ( Diagnosed on 12/29/2022 and first chemotherapy on 01/08/2023) -As mentioned above patient has been diagnosed with pancreatic adenocarcinoma per biopsy.  Started on chemotherapy first dose on 12/27/2022. - Appreciate oncology evaluation   Elevated troponin -High sensitive troponin 19 slight elevated.  Patient denies any chest pain.  EKG showed sinus rhythm with premature atrial complex and nonspecific T wave abnormality.  No concern for ACS at this time. -Continue to trend troponin. - Continue telemonitoring. -Patient has an echo 1 months ago no need to repeat it today.     Chronic atrial fibrillation Patient's CHA2DS2-VASc score is 3. -Per chart review when patient was last discharged from the hospital on 12/22/2022 plan was to hold to Eliquis until ERCP then resume it however patient reported that he never resumed his Eliquis as his oncologist recommended to hold it given he is going to have chemotherapy and may develop pancytopenia. -I  did reach out to on-call cardiology Dr.Tannu, given he has CHA2DS2-VASc score is 3 he supposed to be on anticoagulation. - Appreciate oncology recommendations regarding need for continuation of Eliquis   Hypokalemia - Replete and reevaluate in a.m. -Likely secondary to ongoing Lasix  diuresis   Acute kidney injury CKD stage 2 or 3A (baseline GFR in between 57-67) - Creatinine has been treated up 0.85-1.1 over the course of 2 days.  However patient's baseline creatinine around 1.   -Acute kidney injury secondary from in the setting of combined CHF exacerbation and sepsis. - Initially patient was on IV fluid for sepsis management but at the same time he has CHF exacerbation so IV fluid on hold and giving IV diuretics. - Continue to monitor urine output - Foley has been placed - Strict I's/O - Avoid nephrotoxic agent   History of gout - Continue allopurinol 300 mg daily   BPH -Patient reporting that every time he wants to go to bathroom to pee only few drops of urine comes out.  He has significant urinary urgency and hesitancy. - Resumed home Flomax 0.4 mg daily -Removed Foley due to dysuria   GERD - Resumed home Nexium 40 mg twice daily.  Dysuria with concerns for UTI and mild Foley trauma Continue Pyridium for ongoing pain Continue current antibiotics Urine cultures pending   Obesity BMI 34.59   DVT prophylaxis: Heparin drip now back to Eliquis Code Status: Full Family Communication: Daughter at bedside 7/8 Disposition Plan:  Status is: Inpatient Remains inpatient appropriate because: Need for IV medications   Consultants:  GI Oncology Dr. Truett Perna  Procedures:  None  Antimicrobials:  Anti-infectives (From admission, onward)    Start     Dose/Rate Route Frequency Ordered Stop   01/10/23 2000  vancomycin (VANCOCIN) IVPB 1000 mg/200 mL premix        1,000 mg 200 mL/hr over 60 Minutes Intravenous Every 24 hours 01/09/23 1927 01/16/23 1959   01/10/23 0800  ceFEPIme (MAXIPIME) 2 g in sodium chloride 0.9 % 100 mL IVPB        2 g 200 mL/hr over 30 Minutes Intravenous Every 12 hours 01/09/23 1927 01/16/23 1959   01/10/23 0400  azithromycin (ZITHROMAX) 500 mg in sodium chloride 0.9 % 250 mL IVPB        500 mg 250 mL/hr over 60 Minutes Intravenous  Every 24 hours 01/10/23 0101 01/15/23 0359   01/09/23 2300  vancomycin (VANCOCIN) IVPB 1000 mg/200 mL premix  Status:  Discontinued        1,000 mg 200 mL/hr over 60 Minutes Intravenous  Once 01/09/23 2246 01/09/23 2249   01/09/23 2300  ceFEPIme (MAXIPIME) 2 g in sodium chloride 0.9 % 100 mL IVPB  Status:  Discontinued        2 g 200 mL/hr over 30 Minutes Intravenous  Once 01/09/23 2246 01/09/23 2250   01/09/23 1830  vancomycin (VANCOREADY) IVPB 2000 mg/400 mL        2,000 mg 200 mL/hr over 120 Minutes Intravenous  Once 01/09/23 1815 01/09/23 2252   01/09/23 1815  ceFEPIme (MAXIPIME) 2 g in sodium chloride 0.9 % 100 mL IVPB        2 g 200 mL/hr over 30 Minutes Intravenous  Once 01/09/23 1812 01/09/23 2116   01/09/23 1815  metroNIDAZOLE (FLAGYL) IVPB 500 mg        500 mg 100 mL/hr over 60 Minutes Intravenous  Once 01/09/23 1812 01/09/23 2117   01/09/23 1815  vancomycin (VANCOCIN) IVPB  1000 mg/200 mL premix  Status:  Discontinued        1,000 mg 200 mL/hr over 60 Minutes Intravenous  Once 01/09/23 1812 01/09/23 1815       Subjective: Patient seen and evaluated today with significant dysuria and bladder pain noted.  Objective: Vitals:   01/10/23 2128 01/10/23 2351 01/11/23 0341 01/11/23 0912  BP: 121/65 129/61 (!) 125/58 (!) 108/52  Pulse: 63 64 62 63  Resp: 20 18 20 16   Temp: 98.6 F (37 C) 98.4 F (36.9 C) 98.9 F (37.2 C) 98.4 F (36.9 C)  TempSrc: Oral Oral    SpO2: 97% 100% 93% 94%  Weight:      Height:        Intake/Output Summary (Last 24 hours) at 01/11/2023 1015 Last data filed at 01/11/2023 0900 Gross per 24 hour  Intake 2492.01 ml  Output 1150 ml  Net 1342.01 ml   Filed Weights   01/09/23 1630 01/10/23 0136  Weight: 100 kg 103.2 kg    Examination:  General exam: Appears moderately distressed, port to right chest wall Respiratory system: Clear to auscultation. Respiratory effort normal.  Currently on room air Cardiovascular system: S1 & S2 heard, RRR.   Gastrointestinal system: Abdomen is soft Central nervous system: Alert and awake Extremities: No edema Skin: No significant lesions noted Psychiatry: Flat affect.    Data Reviewed: I have personally reviewed following labs and imaging studies  CBC: Recent Labs  Lab 01/07/23 1159 01/09/23 1635 01/10/23 0144 01/11/23 0306  WBC 4.7 5.0 3.9* 2.8*  NEUTROABS 3.0 4.4  --  2.2  HGB 9.0* 8.9* 8.1* 7.5*  HCT 27.7* 28.1* 24.9* 23.3*  MCV 93.3 93.7 92.6 92.8  PLT 210 160 131* 126*   Basic Metabolic Panel: Recent Labs  Lab 01/07/23 1159 01/09/23 1635 01/09/23 1809 01/10/23 0144 01/11/23 0306  NA 142 135  --  137 134*  K 3.6 3.3*  --  3.2* 3.1*  CL 111 105  --  107 106  CO2 23 21*  --  20* 21*  GLUCOSE 91 145*  --  147* 117*  BUN 21 17  --  20 20  CREATININE 0.85 1.10  --  1.15 1.17  CALCIUM 9.0 7.9*  --  7.8* 7.6*  MG  --   --  1.9 1.7 1.8  PHOS  --   --   --  2.1*  --    GFR: Estimated Creatinine Clearance: 55.7 mL/min (by C-G formula based on SCr of 1.17 mg/dL). Liver Function Tests: Recent Labs  Lab 01/07/23 1159 01/09/23 1635 01/10/23 0144 01/11/23 0306  AST 18 26 21 23   ALT 21 20 22 22   ALKPHOS 168* 140* 117 101  BILITOT 1.2 1.5* 1.7* 1.1  PROT 5.8* 5.7* 5.3* 5.0*  ALBUMIN 3.5 2.6* 2.2* 2.1*   Recent Labs  Lab 01/09/23 1809  LIPASE 25   No results for input(s): "AMMONIA" in the last 168 hours. Coagulation Profile: Recent Labs  Lab 01/09/23 1635  INR 1.3*   Cardiac Enzymes: No results for input(s): "CKTOTAL", "CKMB", "CKMBINDEX", "TROPONINI" in the last 168 hours. BNP (last 3 results) No results for input(s): "PROBNP" in the last 8760 hours. HbA1C: No results for input(s): "HGBA1C" in the last 72 hours. CBG: No results for input(s): "GLUCAP" in the last 168 hours. Lipid Profile: No results for input(s): "CHOL", "HDL", "LDLCALC", "TRIG", "CHOLHDL", "LDLDIRECT" in the last 72 hours. Thyroid Function Tests: No results for input(s): "TSH",  "T4TOTAL", "FREET4", "T3FREE", "THYROIDAB" in  the last 72 hours. Anemia Panel: No results for input(s): "VITAMINB12", "FOLATE", "FERRITIN", "TIBC", "IRON", "RETICCTPCT" in the last 72 hours. Sepsis Labs: Recent Labs  Lab 01/09/23 1635 01/09/23 1835 01/10/23 0144  PROCALCITON  --   --  0.30  LATICACIDVEN 1.5 0.9  --     Recent Results (from the past 240 hour(s))  Culture, blood (Routine x 2)     Status: None (Preliminary result)   Collection Time: 01/09/23  4:53 PM   Specimen: BLOOD  Result Value Ref Range Status   Specimen Description BLOOD RIGHT ANTECUBITAL  Final   Special Requests   Final    BOTTLES DRAWN AEROBIC AND ANAEROBIC Blood Culture adequate volume   Culture   Final    NO GROWTH 2 DAYS Performed at [facility], [address], [place], [place] [address]    Report Status PENDING  Incomplete  Respiratory (~20 pathogens) panel by PCR     Status: None   Collection Time: 01/09/23  6:13 PM   Specimen: Nasopharyngeal Swab; Respiratory  Result Value Ref Range Status   Adenovirus NOT DETECTED NOT DETECTED Final   Coronavirus 229E NOT DETECTED NOT DETECTED Final    Comment: (NOTE) The Coronavirus on the Respiratory Panel, DOES NOT test for the novel  Coronavirus (2019 nCoV)    Coronavirus HKU1 NOT DETECTED NOT DETECTED Final   Coronavirus NL63 NOT DETECTED NOT DETECTED Final   Coronavirus OC43 NOT DETECTED NOT DETECTED Final   Metapneumovirus NOT DETECTED NOT DETECTED Final   Rhinovirus / Enterovirus NOT DETECTED NOT DETECTED Final   Influenza A NOT DETECTED NOT DETECTED Final   Influenza B NOT DETECTED NOT DETECTED Final   Parainfluenza Virus 1 NOT DETECTED NOT DETECTED Final   Parainfluenza Virus 2 NOT DETECTED NOT DETECTED Final   Parainfluenza Virus 3 NOT DETECTED NOT DETECTED Final   Parainfluenza Virus 4 NOT DETECTED NOT DETECTED Final   Respiratory Syncytial Virus NOT DETECTED NOT DETECTED Final   Bordetella pertussis NOT DETECTED NOT DETECTED Final    Bordetella Parapertussis NOT DETECTED NOT DETECTED Final   Chlamydophila pneumoniae NOT DETECTED NOT DETECTED Final   Mycoplasma pneumoniae NOT DETECTED NOT DETECTED Final    Comment: Performed at [facility], [address], [place], [place] [address]  Culture, blood (Routine x 2)     Status: None (Preliminary result)   Collection Time: 01/09/23  6:53 PM   Specimen: BLOOD  Result Value Ref Range Status   Specimen Description BLOOD RIGHT ANTECUBITAL  Final   Special Requests   Final    BOTTLES DRAWN AEROBIC AND ANAEROBIC Blood Culture results may not be optimal due to an excessive volume of blood received in culture bottles   Culture   Final    NO GROWTH 2 DAYS Performed at [facility], [address], [place], [place] [address]    Report Status PENDING  Incomplete         Radiology Studies: DG Sacrum/Coccyx  Result Date: 01/10/2023 CLINICAL DATA:  Fall EXAM: SACRUM AND COCCYX - 2+ VIEW COMPARISON:  CT abdomen and pelvis 01/09/2023 FINDINGS: There is no acute fracture or dislocation identified. Healed mid sacral fracture with angulation appears unchanged. There are degenerative changes of the sacroiliac joints. Prostate radiotherapy seeds are present. There surgical clips in the lower abdomen. IMPRESSION: 1. No acute fracture or dislocation identified. 2. Healed mid sacral fracture. Electronically Signed   By: Darliss Cheney M.D.   On: 01/10/2023 23:52  CT HEAD WO CONTRAST ( )  Result Date: 01/10/2023 CLINICAL DATA:  Fall, head trauma EXAM: CT HEAD WITHOUT CONTRAST TECHNIQUE: Contiguous axial images were obtained from the base of the skull through the vertex without intravenous contrast. RADIATION DOSE REDUCTION: This exam was performed according to the departmental dose-optimization program which includes automated exposure control, adjustment of the mA and/or kV according to patient size and/or use of iterative reconstruction technique. COMPARISON:  12/13/2022 FINDINGS:  Brain: Age related volume loss. No acute intracranial abnormality. Specifically, no hemorrhage, hydrocephalus, mass lesion, acute infarction, or significant intracranial injury. Vascular: No hyperdense vessel or unexpected calcification. Skull: No acute calvarial abnormality. Sinuses/Orbits: No acute findings Other: None IMPRESSION: No acute intracranial abnormality. Electronically Signed   By: Charlett Nose M.D.   On: 01/10/2023 23:46   DG Wrist 2 Views Right  Result Date: 01/10/2023 CLINICAL DATA:  Fall EXAM: RIGHT WRIST - 2 VIEW COMPARISON:  None Available. FINDINGS: Degenerative changes at the 1st carpometacarpal joint. No acute bony abnormality. Specifically, no fracture, subluxation, or dislocation. IMPRESSION: No acute bony abnormality. Electronically Signed   By: Charlett Nose M.D.   On: 01/10/2023 23:45   NM HEPATOBILIARY LEAK (POST-SURGICAL)  Result Date: 01/10/2023 CLINICAL DATA:  Weakness, shortness of breath, lower extremity edema. Recent diagnosis pancreatic cancer. EXAM: NUCLEAR MEDICINE HEPATOBILIARY IMAGING TECHNIQUE: Sequential images of the abdomen were obtained out to 60 minutes following intravenous administration of radiopharmaceutical. RADIOPHARMACEUTICALS:  Five mCi Tc-29m  Choletec IV COMPARISON:  Ultrasound and CT exams of 01/09/2023 FINDINGS: There is satisfactory uptake of radiopharmaceutical from the blood pool. Biliary activity visible 24 minutes. Bowel activity visible 34 minutes. Gallbladder activity visible 55 minutes. We followed activity out for 2 hours. There was some progressive filling of the gallbladder with radiopharmaceutical, and no leak of radiopharmaceutical from the gallbladder or biliary tree is visible. IMPRESSION: 1. Patent cystic duct and patent common bile duct. No leak of radiopharmaceutical from the biliary tree or gallbladder was visible over the 2 hour observation. Electronically Signed   By: Gaylyn Rong M.D.   On: 01/10/2023 15:55   US Abdomen  Limited  Result Date: 01/09/2023 CLINICAL DATA:  Recent diagnosis of pancreatic cancer, presenting with fever. EXAM: ULTRASOUND ABDOMEN LIMITED RIGHT UPPER QUADRANT COMPARISON:  None Available. FINDINGS: Gallbladder: A moderate amount of echogenic sludge is seen within the lumen of a distended gallbladder (approximately 12.1 cm in length). No gallstones are identified. The gallbladder wall measures 5.6 mm in thickness. No sonographic Murphy sign noted by sonographer. Common bile duct: Diameter: 6.5 mm Liver: A 1.9 cm x 1.4 cm x 1.3 cm cyst is seen within the left lobe of the liver. Multiple, ill-defined hyperechoic foci are seen scattered throughout the liver parenchyma. Portal vein is patent on color Doppler imaging with normal direction of blood flow towards the liver. Other: None. IMPRESSION: 1. Gallbladder sludge within a markedly distended gallbladder. 2. Multiple echogenic liver lesions which may represent sequelae associated with hepatic metastasis. 3. Simple left renal cyst. Electronically Signed   By: Aram Candela M.D.   On: 01/09/2023 22:25   CT Angio Chest PE W and/or Wo Contrast  Result Date: 01/09/2023 CLINICAL DATA:  PE suspected, metastatic pancreatic cancer * Tracking Code: BO * EXAM: CT ANGIOGRAPHY CHEST WITH CONTRAST TECHNIQUE: Multidetector CT imaging of the chest was performed using the standard protocol during bolus administration of intravenous contrast. Multiplanar CT image reconstructions and MIPs were obtained to evaluate the vascular anatomy. RADIATION DOSE REDUCTION: This exam was performed according to  the departmental dose-optimization program which includes automated exposure control, adjustment of the mA and/or kV according to patient size and/or use of iterative reconstruction technique. CONTRAST:  75mL OMNIPAQUE IOHEXOL 350 MG/ML SOLN COMPARISON:  09/27/2019 FINDINGS: Cardiovascular: Satisfactory opacification of the pulmonary arteries to the segmental level. No evidence of  pulmonary embolism. Mild cardiomegaly. Left and right coronary artery calcifications. Left chest multi lead pacer defibrillator. No pericardial effusion. Aortic atherosclerosis. Right chest port catheter. Mediastinum/Nodes: No enlarged mediastinal, hilar, or axillary lymph nodes. Thyroid gland, trachea, and esophagus demonstrate no significant findings. Lungs/Pleura: Small bilateral pleural effusions and associated atelectasis or consolidation. Acute appearing, heterogeneous airspace opacity of the superior segment left lower lobe (series 4, image 62). Upper Abdomen: Please see separately reported examination of the abdomen and pelvis Musculoskeletal: No chest wall abnormality. No acute osseous findings. Disc degenerative disease and bridging osteophytosis throughout the thoracic spine, in keeping with DISH. Review of the MIP images confirms the above findings. IMPRESSION: 1. Negative examination for pulmonary embolism. 2. Small bilateral pleural effusions and associated atelectasis or consolidation. 3. Acute appearing, heterogeneous airspace opacity of the superior segment left lower lobe, consistent with infection or aspiration. 4. Coronary artery disease. Aortic Atherosclerosis (ICD10-I70.0). Electronically Signed   By: Jearld Lesch M.D.   On: 01/09/2023 21:09   CT ABDOMEN PELVIS W CONTRAST  Result Date: 01/09/2023 CLINICAL DATA:  Abdominal pain, pancreatic mass, * Tracking Code: BO * EXAM: CT ABDOMEN AND PELVIS WITH CONTRAST TECHNIQUE: Multidetector CT imaging of the abdomen and pelvis was performed using the standard protocol following bolus administration of intravenous contrast. RADIATION DOSE REDUCTION: This exam was performed according to the departmental dose-optimization program which includes automated exposure control, adjustment of the mA and/or kV according to patient size and/or use of iterative reconstruction technique. CONTRAST:  75mL OMNIPAQUE IOHEXOL 350 MG/ML SOLN COMPARISON:  CT abdomen  pelvis, 12/03/2022 FINDINGS: Lower chest: Please see separately reported examination of the chest Hepatobiliary: Multiple low-attenuation liver lesions are unchanged, the largest of which are clearly benign fluid attenuation cysts, others more ill-defined and incompletely characterized, although remains suspicious for small metastases. Distended gallbladder mild gallbladder wall thickening and adjacent pericholecystic fat stranding (series 5, image 43). Sludge in the gallbladder fundus (series 9, image 78). Common bile duct stent has been placed, with diminished, although persistent intrahepatic biliary ductal dilatation (series 5, image 31, series 8, image 74). Pancreas: Similar appearance of ill-defined hypodense mass in the superior pancreatic head measuring 2.7 x 2.7 cm (series 5, image 37). Mild atrophy of the distal pancreas with prominence of the pancreatic duct measuring up to 0.5 cm, unchanged (series 5, image 35). Spleen: Normal in size without significant abnormality. Adrenals/Urinary Tract: Adrenal glands are unremarkable. Simple, benign bilateral renal cortical cysts, for which no further follow-up or characterization is required. Kidneys are normal, without renal calculi, solid lesion, or hydronephrosis. Bladder is unremarkable. Stomach/Bowel: Status post Roux type gastric bypass. Appendix appears normal. No evidence of bowel wall thickening, distention, or inflammatory changes. Status post sigmoid colon resection and reanastomosis. Vascular/Lymphatic: Aortic atherosclerosis. No enlarged abdominal or pelvic lymph nodes. Reproductive: Prostate brachytherapy. Other: Mesh repair of broad-based midline ventral hernia. Anasarca. No ascites. Musculoskeletal: No acute or significant osseous findings. IMPRESSION: 1. Distended gallbladder with mild gallbladder wall thickening and adjacent pericholecystic fat stranding. Sludge in the gallbladder fundus. Findings are concerning for acute cholecystitis. 2. Common  bile duct stent has been placed, with diminished, although persistent intrahepatic biliary ductal dilatation. 3. Similar appearance of ill-defined hypodense mass in  the superior pancreatic head, consistent with known pancreatic adenocarcinoma. Mild atrophy of the distal pancreas with prominence of the pancreatic duct measuring up to 0.5 cm, unchanged. 4. Multiple low-attenuation liver lesions are unchanged, the largest of which are clearly benign fluid attenuation cysts, others more ill-defined and incompletely characterized, although in keeping with small metastases. 5. Status post Roux type gastric bypass. 6. Status post sigmoid colon resection and reanastomosis. 7. Prostate brachytherapy. Aortic Atherosclerosis (ICD10-I70.0). Electronically Signed   By: Jearld Lesch M.D.   On: 01/09/2023 21:05   DG Chest 2 View  Result Date: 01/09/2023 CLINICAL DATA:  Suspected sepsis EXAM: CHEST - 2 VIEW COMPARISON:  Radiograph 11/24/2022 FINDINGS: LEFT-sided pacer overlies normal cardiac silhouette. Port in the anterior chest wall with tip in distal SVC. Elevation LEFT hemidiaphragm. No effusion, infiltrate or pneumothorax. Hyperinflated lungs. IMPRESSION: 1. No acute cardiopulmonary process. 2. Hyperinflated lungs suggest COPD. Electronically Signed   By: Genevive Bi M.D.   On: 01/09/2023 17:28        Scheduled Meds:  allopurinol  300 mg Oral Daily   calcium-vitamin D  1 tablet Oral Daily   Chlorhexidine Gluconate Cloth  6 each Topical Daily   furosemide  40 mg Intravenous BID   guaiFENesin  600 mg Oral BID   lidocaine  1 Application Urethral Once   melatonin  10 mg Oral QHS   multivitamin with minerals  1 tablet Oral q AM   pantoprazole  40 mg Oral Daily   phenazopyridine  200 mg Oral TID WC   potassium chloride  40 mEq Oral BID   tamsulosin  0.4 mg Oral Daily   Continuous Infusions:  sodium chloride 10 mL/hr at 01/10/23 2022   azithromycin Stopped (01/11/23 4782)   ceFEPime (MAXIPIME) IV 2 g  (01/11/23 0929)   vancomycin Stopped (01/10/23 2346)     LOS: 2 days    Time spent: 35 minutes    Arek Spadafore Hoover Brunette, DO [facility]  If 7PM-7AM, please contact night-coverage [email or web address] 01/11/2023, 10:15 AM

## 2023-01-11 NOTE — Progress Notes (Signed)
IP PROGRESS NOTE  Subjective:   Mr. Rippeon was recently diagnosed with pancreas cancer.  He completed cycle 1 gemcitabine/Abraxane on 01/07/2023.  He developed a high fever, generalized weakness, and increased dyspnea on 01/09/2023.  He presented to the emergency room. A CT chest revealed airspace opacity in the left lower lung.  A CT abdomen/pelvis revealed a distended gallbladder with mild wall thickening and adjacent fat stranding. He was admitted and placed on antibiotics.  Gastroenterology was consulted.  Dr. Tomasa Rand does not feel the presentation is related to acute cholecystitis.  Mr Shough reports developing burning with urination after a Foley catheter was placed.  The Foley has been removed.  He feels improved in general.  He would like to begin ambulating.  He had a fall from a chair when reaching for a soda last night.   Objective: Vital signs in last 24 hours: Blood pressure (!) 125/58, pulse 62, temperature 98.9 F (37.2 C), resp. rate 20, height 5\' 8"  (1.727 m), weight 227 lb 8.2 oz (103.2 kg), SpO2 93 %.  Intake/Output from previous day: 07/08 0701 - 07/09 0700 In: 2600.7 [P.O.:1280; I.V.:254.6; IV Piggyback:1066.1] Out: 2225 [Urine:2225]  Physical Exam:  HEENT: No thrush or ulcers Lungs: Decreased breath sounds at the lower posterior chest bilaterally, no respiratory distress Cardiac: Regular rate and rhythm Abdomen: No hepatosplenomegaly, nontender, no mass Extremities: Trace lower leg edema bilaterally   Portacath/PICC-without erythema  Lab Results: Recent Labs    01/10/23 0144 01/11/23 0306  WBC 3.9* 2.8*  HGB 8.1* 7.5*  HCT 24.9* 23.3*  PLT 131* 126*    BMET Recent Labs    01/10/23 0144 01/11/23 0306  NA 137 134*  K 3.2* 3.1*  CL 107 106  CO2 20* 21*  GLUCOSE 147* 117*  BUN 20 20  CREATININE 1.15 1.17  CALCIUM 7.8* 7.6*    Lab Results  Component Value Date   CEA1 6.4 (H) 12/11/2022   CEA 1.1 03/03/2010   CAN199 935 (H) 01/07/2023     Studies/Results: DG Sacrum/Coccyx  Result Date: 01/10/2023 CLINICAL DATA:  Fall EXAM: SACRUM AND COCCYX - 2+ VIEW COMPARISON:  CT abdomen and pelvis 01/09/2023 FINDINGS: There is no acute fracture or dislocation identified. Healed mid sacral fracture with angulation appears unchanged. There are degenerative changes of the sacroiliac joints. Prostate radiotherapy seeds are present. There surgical clips in the lower abdomen. IMPRESSION: 1. No acute fracture or dislocation identified. 2. Healed mid sacral fracture. Electronically Signed   By: Darliss Cheney M.D.   On: 01/10/2023 23:52   CT HEAD WO CONTRAST ( )  Result Date: 01/10/2023 CLINICAL DATA:  Fall, head trauma EXAM: CT HEAD WITHOUT CONTRAST TECHNIQUE: Contiguous axial images were obtained from the base of the skull through the vertex without intravenous contrast. RADIATION DOSE REDUCTION: This exam was performed according to the departmental dose-optimization program which includes automated exposure control, adjustment of the mA and/or kV according to patient size and/or use of iterative reconstruction technique. COMPARISON:  12/13/2022 FINDINGS: Brain: Age related volume loss. No acute intracranial abnormality. Specifically, no hemorrhage, hydrocephalus, mass lesion, acute infarction, or significant intracranial injury. Vascular: No hyperdense vessel or unexpected calcification. Skull: No acute calvarial abnormality. Sinuses/Orbits: No acute findings Other: None IMPRESSION: No acute intracranial abnormality. Electronically Signed   By: Charlett Nose M.D.   On: 01/10/2023 23:46   DG Wrist 2 Views Right  Result Date: 01/10/2023 CLINICAL DATA:  Fall EXAM: RIGHT WRIST - 2 VIEW COMPARISON:  None Available. FINDINGS: Degenerative changes at the  1st carpometacarpal joint. No acute bony abnormality. Specifically, no fracture, subluxation, or dislocation. IMPRESSION: No acute bony abnormality. Electronically Signed   By: Charlett Nose M.D.   On:  01/10/2023 23:45   NM HEPATOBILIARY LEAK (POST-SURGICAL)  Result Date: 01/10/2023 CLINICAL DATA:  Weakness, shortness of breath, lower extremity edema. Recent diagnosis pancreatic cancer. EXAM: NUCLEAR MEDICINE HEPATOBILIARY IMAGING TECHNIQUE: Sequential images of the abdomen were obtained out to 60 minutes following intravenous administration of radiopharmaceutical. RADIOPHARMACEUTICALS:  Five mCi Tc-47m  Choletec IV COMPARISON:  Ultrasound and CT exams of 01/09/2023 FINDINGS: There is satisfactory uptake of radiopharmaceutical from the blood pool. Biliary activity visible 24 minutes. Bowel activity visible 34 minutes. Gallbladder activity visible 55 minutes. We followed activity out for 2 hours. There was some progressive filling of the gallbladder with radiopharmaceutical, and no leak of radiopharmaceutical from the gallbladder or biliary tree is visible. IMPRESSION: 1. Patent cystic duct and patent common bile duct. No leak of radiopharmaceutical from the biliary tree or gallbladder was visible over the 2 hour observation. Electronically Signed   By: Gaylyn Rong M.D.   On: 01/10/2023 15:55   US Abdomen Limited  Result Date: 01/09/2023 CLINICAL DATA:  Recent diagnosis of pancreatic cancer, presenting with fever. EXAM: ULTRASOUND ABDOMEN LIMITED RIGHT UPPER QUADRANT COMPARISON:  None Available. FINDINGS: Gallbladder: A moderate amount of echogenic sludge is seen within the lumen of a distended gallbladder (approximately 12.1 cm in length). No gallstones are identified. The gallbladder wall measures 5.6 mm in thickness. No sonographic Murphy sign noted by sonographer. Common bile duct: Diameter: 6.5 mm Liver: A 1.9 cm x 1.4 cm x 1.3 cm cyst is seen within the left lobe of the liver. Multiple, ill-defined hyperechoic foci are seen scattered throughout the liver parenchyma. Portal vein is patent on color Doppler imaging with normal direction of blood flow towards the liver. Other: None. IMPRESSION: 1.  Gallbladder sludge within a markedly distended gallbladder. 2. Multiple echogenic liver lesions which may represent sequelae associated with hepatic metastasis. 3. Simple left renal cyst. Electronically Signed   By: Aram Candela M.D.   On: 01/09/2023 22:25   CT Angio Chest PE W and/or Wo Contrast  Result Date: 01/09/2023 CLINICAL DATA:  PE suspected, metastatic pancreatic cancer * Tracking Code: BO * EXAM: CT ANGIOGRAPHY CHEST WITH CONTRAST TECHNIQUE: Multidetector CT imaging of the chest was performed using the standard protocol during bolus administration of intravenous contrast. Multiplanar CT image reconstructions and MIPs were obtained to evaluate the vascular anatomy. RADIATION DOSE REDUCTION: This exam was performed according to the departmental dose-optimization program which includes automated exposure control, adjustment of the mA and/or kV according to patient size and/or use of iterative reconstruction technique. CONTRAST:  75mL OMNIPAQUE IOHEXOL 350 MG/ML SOLN COMPARISON:  09/27/2019 FINDINGS: Cardiovascular: Satisfactory opacification of the pulmonary arteries to the segmental level. No evidence of pulmonary embolism. Mild cardiomegaly. Left and right coronary artery calcifications. Left chest multi lead pacer defibrillator. No pericardial effusion. Aortic atherosclerosis. Right chest port catheter. Mediastinum/Nodes: No enlarged mediastinal, hilar, or axillary lymph nodes. Thyroid gland, trachea, and esophagus demonstrate no significant findings. Lungs/Pleura: Small bilateral pleural effusions and associated atelectasis or consolidation. Acute appearing, heterogeneous airspace opacity of the superior segment left lower lobe (series 4, image 62). Upper Abdomen: Please see separately reported examination of the abdomen and pelvis Musculoskeletal: No chest wall abnormality. No acute osseous findings. Disc degenerative disease and bridging osteophytosis throughout the thoracic spine, in keeping  with DISH. Review of the MIP images confirms the above  findings. IMPRESSION: 1. Negative examination for pulmonary embolism. 2. Small bilateral pleural effusions and associated atelectasis or consolidation. 3. Acute appearing, heterogeneous airspace opacity of the superior segment left lower lobe, consistent with infection or aspiration. 4. Coronary artery disease. Aortic Atherosclerosis (ICD10-I70.0). Electronically Signed   By: Jearld Lesch M.D.   On: 01/09/2023 21:09   CT ABDOMEN PELVIS W CONTRAST  Result Date: 01/09/2023 CLINICAL DATA:  Abdominal pain, pancreatic mass, * Tracking Code: BO * EXAM: CT ABDOMEN AND PELVIS WITH CONTRAST TECHNIQUE: Multidetector CT imaging of the abdomen and pelvis was performed using the standard protocol following bolus administration of intravenous contrast. RADIATION DOSE REDUCTION: This exam was performed according to the departmental dose-optimization program which includes automated exposure control, adjustment of the mA and/or kV according to patient size and/or use of iterative reconstruction technique. CONTRAST:  75mL OMNIPAQUE IOHEXOL 350 MG/ML SOLN COMPARISON:  CT abdomen pelvis, 12/03/2022 FINDINGS: Lower chest: Please see separately reported examination of the chest Hepatobiliary: Multiple low-attenuation liver lesions are unchanged, the largest of which are clearly benign fluid attenuation cysts, others more ill-defined and incompletely characterized, although remains suspicious for small metastases. Distended gallbladder mild gallbladder wall thickening and adjacent pericholecystic fat stranding (series 5, image 43). Sludge in the gallbladder fundus (series 9, image 78). Common bile duct stent has been placed, with diminished, although persistent intrahepatic biliary ductal dilatation (series 5, image 31, series 8, image 74). Pancreas: Similar appearance of ill-defined hypodense mass in the superior pancreatic head measuring 2.7 x 2.7 cm (series 5, image 37). Mild  atrophy of the distal pancreas with prominence of the pancreatic duct measuring up to 0.5 cm, unchanged (series 5, image 35). Spleen: Normal in size without significant abnormality. Adrenals/Urinary Tract: Adrenal glands are unremarkable. Simple, benign bilateral renal cortical cysts, for which no further follow-up or characterization is required. Kidneys are normal, without renal calculi, solid lesion, or hydronephrosis. Bladder is unremarkable. Stomach/Bowel: Status post Roux type gastric bypass. Appendix appears normal. No evidence of bowel wall thickening, distention, or inflammatory changes. Status post sigmoid colon resection and reanastomosis. Vascular/Lymphatic: Aortic atherosclerosis. No enlarged abdominal or pelvic lymph nodes. Reproductive: Prostate brachytherapy. Other: Mesh repair of broad-based midline ventral hernia. Anasarca. No ascites. Musculoskeletal: No acute or significant osseous findings. IMPRESSION: 1. Distended gallbladder with mild gallbladder wall thickening and adjacent pericholecystic fat stranding. Sludge in the gallbladder fundus. Findings are concerning for acute cholecystitis. 2. Common bile duct stent has been placed, with diminished, although persistent intrahepatic biliary ductal dilatation. 3. Similar appearance of ill-defined hypodense mass in the superior pancreatic head, consistent with known pancreatic adenocarcinoma. Mild atrophy of the distal pancreas with prominence of the pancreatic duct measuring up to 0.5 cm, unchanged. 4. Multiple low-attenuation liver lesions are unchanged, the largest of which are clearly benign fluid attenuation cysts, others more ill-defined and incompletely characterized, although in keeping with small metastases. 5. Status post Roux type gastric bypass. 6. Status post sigmoid colon resection and reanastomosis. 7. Prostate brachytherapy. Aortic Atherosclerosis (ICD10-I70.0). Electronically Signed   By: Jearld Lesch M.D.   On: 01/09/2023 21:05    DG Chest 2 View  Result Date: 01/09/2023 CLINICAL DATA:  Suspected sepsis EXAM: CHEST - 2 VIEW COMPARISON:  Radiograph 11/24/2022 FINDINGS: LEFT-sided pacer overlies normal cardiac silhouette. Port in the anterior chest wall with tip in distal SVC. Elevation LEFT hemidiaphragm. No effusion, infiltrate or pneumothorax. Hyperinflated lungs. IMPRESSION: 1. No acute cardiopulmonary process. 2. Hyperinflated lungs suggest COPD. Electronically Signed   By: Loura Halt.D.  On: 01/09/2023 17:28    Medications: I have reviewed the patient's current medications.  Assessment/Plan: Pancreas cancer CT abdomen/pelvis 12/03/2022-suspected pancreas mass in the proximal body measuring 2 cm with probable mass effect on the superior aspect of the SMV, multiple hypodense liver lesions, many are new from a remote CT-indeterminate, 2 cystic lesions in the pancreas tail-nonspecific prior bariatric surgery ERCP 12/13/2022-malignant appearing common bile duct stricture, plastic stent placed, brushing cytology-no malignant cells EUS 12/23/2022-30 x 33 mm pancreas head mass with invasion of the SMV, no malignant appearing lymph nodes, 4 cysts in the visualized liver, FNA-adenocarcinoma Biliary obstruction secondary to 1-status post ERCP/stent placement 12/13/2022 Coronary artery disease Atrial fibrillation Gout Sleep apnea Status post bariatric surgery Hypertension Bronchiectasis Gastroesophageal reflux disease History of colon cancer in his 40s Complete heart block-pacemaker placed Admission 01/09/2023 with a fever and failure to thrive, CT chest with evidence of left lung pneumonia, CT abdomen/pelvis with a distended gallbladder and Jaison fat stranding Anemia  Mr Geerts is now at day 5 following cycle 1 of gemcitabine/Abraxane given for treatment of pancreas cancer.  He was admitted on 01/09/2023 with a febrile illness.  It is possible the fever was related to gemcitabine (this is a common toxicity from  gemcitabine usually seen within the first 1-2 days following chemotherapy), or he may have pneumonia.  Dyspnea could be related to pneumonia or CHF/COPD.  I have a low clinical suspicion for a biliary infection or Port-A-Cath infection.  There is no clinical evidence for progression of the pancreas cancer.  He has developed severe anemia over the past month.  This predated chemotherapy.  He had dark stools while on apixaban.  Apixaban was placed on hold a few weeks ago and the stool color returned to normal.  The anemia had improved prior to this hospital admission.  Recommendations: Antibiotics per the medical service Monitor hemoglobin closely while on apixaban, consider dose reduction given his age and bleeding risk Check CBC next few days, as white count and platelets are expected to fall from chemotherapy Outpatient follow-up is scheduled at the Cancer center, please call oncology as needed          LOS: 2 days   Thornton Papas, MD   01/11/2023, 7:37 AM

## 2023-01-11 NOTE — Progress Notes (Signed)
ANTICOAGULATION CONSULT NOTE - Initial Consult  Pharmacy Consult for apixaban Indication: atrial fibrillation  Allergies  Allergen Reactions   Contrast Media [Iodinated Contrast Media] Palpitations    TACHYCARDIA- patient states he has tolerated newer agents since this reaction >30 yrs ago    Patient Measurements: Height: 5\' 8"  (172.7 cm) Weight: 103.2 kg (227 lb 8.2 oz) IBW/kg (Calculated) : 68.4  Vital Signs: Temp: 98.4 F (36.9 C) (07/09 0912) Temp Source: Oral (07/08 2351) BP: 108/52 (07/09 0912) Pulse Rate: 63 (07/09 0912)  Labs: Recent Labs    01/09/23 1635 01/09/23 1809 01/10/23 0144 01/11/23 0306  HGB 8.9*  --  8.1* 7.5*  HCT 28.1*  --  24.9* 23.3*  PLT 160  --  131* 126*  LABPROT 16.0*  --   --   --   INR 1.3*  --   --   --   HEPARINUNFRC  --   --   --  <0.10*  CREATININE 1.10  --  1.15 1.17  TROPONINIHS  --  19* 17  --     Estimated Creatinine Clearance: 55.7 mL/min (by C-G formula based on SCr of 1.17 mg/dL).   Medical History: Past Medical History:  Diagnosis Date   Acute meniscal tear of knee left   Arthritis    generalized   AV block 08/01/2018   Benign essential hypertension 01/18/2007   Bronchiectasis with acute exacerbation 06/23/2020   Chest pain, atypical 08/23/2014   Chronic cough 10/07/2014   Coronary artery disease    Degeneration of lumbar intervertebral disc 10/14/2017   Diverticulosis of colon 07/07/2005   Elevated PSA 06/19/2014   GERD (gastroesophageal reflux disease) 01/18/2007   Gout, unspecified 01/18/2007   Hemorrhoids 01/19/2011   Hiatal hernia    History of colonic polyps 01/18/2007   Insomnia, unspecified 08/21/2007   Iron deficiency anemia, unspecified  01/19/2011   Lumbar post-laminectomy syndrome 10/14/2017   Lumbar spondylolysis    Lumbar strain 12/14/2011   Obstructive sleep apnea 01/18/2007   uses CPAP nightly   Paraesophageal hernia    large   Primary osteoarthritis of knee 05/23/2015   Prostate cancer  (HCC) 2016   S/P knee replacement 05/23/2015   Sensorineural hearing loss, bilateral    Vitamin B12 deficiency    Vitamin D deficiency 10/28/2009    Medications:  Scheduled:   allopurinol  300 mg Oral Daily   calcium-vitamin D  1 tablet Oral Daily   Chlorhexidine Gluconate Cloth  6 each Topical Daily   furosemide  40 mg Intravenous BID   guaiFENesin  600 mg Oral BID   lidocaine  1 Application Urethral Once   melatonin  10 mg Oral QHS   multivitamin with minerals  1 tablet Oral q AM   pantoprazole  40 mg Oral Daily   phenazopyridine  200 mg Oral TID WC   potassium chloride  40 mEq Oral BID   tamsulosin  0.4 mg Oral Daily   Infusions:   sodium chloride 10 mL/hr at 01/10/23 2022   azithromycin Stopped (01/11/23 1610)   ceFEPime (MAXIPIME) IV 2 g (01/11/23 0929)   vancomycin Stopped (01/10/23 2346)    Assessment: Marcus [name] is a 79 YOM who had his first chemotherapy on Friday, 7/5, and now c/o fever, diarrhea, and urinary retention >> admitted for concern for sepsis. Pt was initially started on heparin because of plans for ERCP. The procedure was cancelled due to HIDA scan results unremarkable. The pt is now being transitioned back to Eliquis, which he  was on PTA.   Goal of Therapy:   Monitor platelets by anticoagulation protocol: Yes   Plan:  Start Eliquis 5 mg PO BID Monitor CBC, SCr, weight, s/sx of bleeding  Marcus [name], PharmD PGY-1 Acute Care Pharmacy Resident 01/11/2023 10:48 AM

## 2023-01-11 NOTE — Progress Notes (Signed)
NT found patient sitting on the floor, noted that some area of floor was wet. Per patient, he was reaching for can drink and was trying to open it but couldn't.When asked if he slipped out of the chair, patient stated " I don't know. He thinks that the chair/recliner was not locked but NT said it was locked because she had to unlock it in order to move it out of the way. Patient denies hitting his head, no obvious injury noted. Patient said he hit his right arm/ wrist area and hurts a little. Patient was able to move all extremities but too weak to stand up. Assisted patient back to bed with use of maxi move. Bed alarm activated,call bell within reach. VSS BP 121/65 HR 63 RR 20 T 98.6 O2 sat 97% on RA. Dr. Julian Reil notified with orders received. Patient refused for RN to notify family,patient himself notified his daughter,Stephanie. Will continue to monitor.

## 2023-01-11 NOTE — Progress Notes (Signed)
   01/11/23 1633  Spiritual Encounters  Type of Visit Initial  Care provided to: Pt and family  Referral source Family  Reason for visit Advance directives  OnCall Visit No  Advance Directives (For Healthcare)  Does Patient Have a Medical Advance Directive? No  Does patient want to make changes to medical advance directive? Yes (Inpatient - patient defers changing a medical advance directive at this time - Information given)  Would patient like information on creating a medical advance directive? Yes (Inpatient - patient defers creating a medical advance directive at this time - Information given)   Responded to consult for advance directive. Provided patient and daughter Lawson Fiscal with education. Family will discuss advance directive and living will, if they agree, they will complete and return.

## 2023-01-11 NOTE — Progress Notes (Signed)
ANTICOAGULATION CONSULT NOTE -Follow Up  Pharmacy Consult for heparin Indication: atrial fibrillation  Allergies  Allergen Reactions   Contrast Media [Iodinated Contrast Media] Palpitations    TACHYCARDIA- patient states he has tolerated newer agents since this reaction >30 yrs ago    Patient Measurements: Height: 5\' 8"  (172.7 cm) Weight: 103.2 kg (227 lb 8.2 oz) IBW/kg (Calculated) : 68.4 Heparin Dosing Weight: 90kg  Vital Signs: Temp: 98.9 F (37.2 C) (07/09 0341) Temp Source: Oral (07/08 2351) BP: 125/58 (07/09 0341) Pulse Rate: 62 (07/09 0341)  Labs: Recent Labs    01/09/23 1635 01/09/23 1809 01/10/23 0144 01/11/23 0306  HGB 8.9*  --  8.1* 7.5*  HCT 28.1*  --  24.9* 23.3*  PLT 160  --  131* 126*  LABPROT 16.0*  --   --   --   INR 1.3*  --   --   --   HEPARINUNFRC  --   --   --  <0.10*  CREATININE 1.10  --  1.15 1.17  TROPONINIHS  --  19* 17  --      Estimated Creatinine Clearance: 55.7 mL/min (by C-G formula based on SCr of 1.17 mg/dL).   Assessment: 82yo male had first chemotherapy on Friday and now c/o fever, diarrhea, and urinary retention >> admitted for concern for sepsis; plan for ERCP on 7/8 and to start heparin for Afib overnight; of note pt is on apixaban PTA for Afib but pt states that he was never told to resume it after held for procedure 6/6.  Heparin on hold since 0800 for ERCP. ERCP cancelled, HIDA scan results unremarkable so okay with Dr. Sherryll Burger to resume heparin gtt.   Heparin drip had stop time in for 0200. Was off for 1 hour prior to level being drawn. Will re-bolus and start infusion at same rate. Discussed with MD, will wait to get all clear to restart PTA eliquis.  Goal of Therapy:  Heparin level 0.3-0.7 units/ml Monitor platelets by anticoagulation protocol: Yes   Plan:  Heparin 3000 units IV bolus x1  Restart infusion at 1400 units/hr F/u 8 hr heparin level  Daily heparin level and CBC F/u ability to transition back to  apixaban  Arabella Merles, PharmD. Clinical Pharmacist 01/11/2023 5:19 AM

## 2023-01-11 NOTE — Progress Notes (Signed)
Patient ID: Marcus [name], male   DOB: 15-Aug-1940, 82 y.o.   MRN: [id number] .   Progress Note   Subjective   Day # 2  CC; fever, SOB , LE edema  Azithromycin IV bank/cefepime  Labs-WBC 2.8/hemoglobin 7.5/platelets 126 Sodium 130/potassium 3.1/BUN 20/creatinine 1.17 LFTs normal UA was positive, urine culture negative thus far Blood cultures negative thus far  Patient had a fall last evening, says he reached for something and lost his balance Imaging negative Says he is feeling stronger overall today and better No complaints of abdominal pain, no nausea Daughter at bedside    Objective   Vital signs in last 24 hours: Temp:  [98.3 F (36.8 C)-98.9 F (37.2 C)] 98.4 F (36.9 C) (07/09 0912) Pulse Rate:  [62-65] 63 (07/09 0912) Resp:  [16-20] 16 (07/09 0912) BP: (108-129)/(52-65) 108/52 (07/09 0912) SpO2:  [93 %-100 %] 94 % (07/09 0912) Last BM Date : 01/09/23 General:    elderly WM  in NAD, chronically ill-appearing Heart:  Regular rate and rhythm; no murmurs Lungs: Respirations even and unlabored, lungs CTA bilaterally Abdomen:  Soft, nontender and nondistended. Normal bowel sounds. Extremities:  Without edema. Neurologic:  Alert and oriented,  grossly normal neurologically. Psych:  Cooperative. Normal mood and affect.  Intake/Output from previous day: 07/08 0701 - 07/09 0700 In: 2600.7 [P.O.:1280; I.V.:254.6; IV Piggyback:1066.1] Out: 2225 [Urine:2225] Intake/Output this shift: Total I/O In: 240 [P.O.:240] Out: 1500 [Urine:1500]  Lab Results: Recent Labs    01/09/23 1635 01/10/23 0144 01/11/23 0306  WBC 5.0 3.9* 2.8*  HGB 8.9* 8.1* 7.5*  HCT 28.1* 24.9* 23.3*  PLT 160 131* 126*   BMET Recent Labs    01/09/23 1635 01/10/23 0144 01/11/23 0306  NA 135 137 134*  K 3.3* 3.2* 3.1*  CL 105 107 106  CO2 21* 20* 21*  GLUCOSE 145* 147* 117*  BUN 17 20 20   CREATININE 1.10 1.15 1.17  CALCIUM 7.9* 7.8* 7.6*   LFT Recent Labs    01/11/23 0306  PROT  5.0*  ALBUMIN 2.1*  AST 23  ALT 22  ALKPHOS 101  BILITOT 1.1   PT/INR Recent Labs    01/09/23 1635  LABPROT 16.0*  INR 1.3*    Studies/Results: DG Sacrum/Coccyx  Result Date: 01/10/2023 CLINICAL DATA:  Fall EXAM: SACRUM AND COCCYX - 2+ VIEW COMPARISON:  CT abdomen and pelvis 01/09/2023 FINDINGS: There is no acute fracture or dislocation identified. Healed mid sacral fracture with angulation appears unchanged. There are degenerative changes of the sacroiliac joints. Prostate radiotherapy seeds are present. There surgical clips in the lower abdomen. IMPRESSION: 1. No acute fracture or dislocation identified. 2. Healed mid sacral fracture. Electronically Signed   By: Darliss Cheney M.D.   On: 01/10/2023 23:52   CT HEAD WO CONTRAST ( )  Result Date: 01/10/2023 CLINICAL DATA:  Fall, head trauma EXAM: CT HEAD WITHOUT CONTRAST TECHNIQUE: Contiguous axial images were obtained from the base of the skull through the vertex without intravenous contrast. RADIATION DOSE REDUCTION: This exam was performed according to the departmental dose-optimization program which includes automated exposure control, adjustment of the mA and/or kV according to patient size and/or use of iterative reconstruction technique. COMPARISON:  12/13/2022 FINDINGS: Brain: Age related volume loss. No acute intracranial abnormality. Specifically, no hemorrhage, hydrocephalus, mass lesion, acute infarction, or significant intracranial injury. Vascular: No hyperdense vessel or unexpected calcification. Skull: No acute calvarial abnormality. Sinuses/Orbits: No acute findings Other: None IMPRESSION: No acute intracranial abnormality. Electronically Signed   By: Caryn Bee  Dover M.D.   On: 01/10/2023 23:46   DG Wrist 2 Views Right  Result Date: 01/10/2023 CLINICAL DATA:  Fall EXAM: RIGHT WRIST - 2 VIEW COMPARISON:  None Available. FINDINGS: Degenerative changes at the 1st carpometacarpal joint. No acute bony abnormality. Specifically, no  fracture, subluxation, or dislocation. IMPRESSION: No acute bony abnormality. Electronically Signed   By: Charlett Nose M.D.   On: 01/10/2023 23:45   NM HEPATOBILIARY LEAK (POST-SURGICAL)  Result Date: 01/10/2023 CLINICAL DATA:  Weakness, shortness of breath, lower extremity edema. Recent diagnosis pancreatic cancer. EXAM: NUCLEAR MEDICINE HEPATOBILIARY IMAGING TECHNIQUE: Sequential images of the abdomen were obtained out to 60 minutes following intravenous administration of radiopharmaceutical. RADIOPHARMACEUTICALS:  Five mCi Tc-10m  Choletec IV COMPARISON:  Ultrasound and CT exams of 01/09/2023 FINDINGS: There is satisfactory uptake of radiopharmaceutical from the blood pool. Biliary activity visible 24 minutes. Bowel activity visible 34 minutes. Gallbladder activity visible 55 minutes. We followed activity out for 2 hours. There was some progressive filling of the gallbladder with radiopharmaceutical, and no leak of radiopharmaceutical from the gallbladder or biliary tree is visible. IMPRESSION: 1. Patent cystic duct and patent common bile duct. No leak of radiopharmaceutical from the biliary tree or gallbladder was visible over the 2 hour observation. Electronically Signed   By: Gaylyn Rong M.D.   On: 01/10/2023 15:55   US Abdomen Limited  Result Date: 01/09/2023 CLINICAL DATA:  Recent diagnosis of pancreatic cancer, presenting with fever. EXAM: ULTRASOUND ABDOMEN LIMITED RIGHT UPPER QUADRANT COMPARISON:  None Available. FINDINGS: Gallbladder: A moderate amount of echogenic sludge is seen within the lumen of a distended gallbladder (approximately 12.1 cm in length). No gallstones are identified. The gallbladder wall measures 5.6 mm in thickness. No sonographic Murphy sign noted by sonographer. Common bile duct: Diameter: 6.5 mm Liver: A 1.9 cm x 1.4 cm x 1.3 cm cyst is seen within the left lobe of the liver. Multiple, ill-defined hyperechoic foci are seen scattered throughout the liver parenchyma.  Portal vein is patent on color Doppler imaging with normal direction of blood flow towards the liver. Other: None. IMPRESSION: 1. Gallbladder sludge within a markedly distended gallbladder. 2. Multiple echogenic liver lesions which may represent sequelae associated with hepatic metastasis. 3. Simple left renal cyst. Electronically Signed   By: Aram Candela M.D.   On: 01/09/2023 22:25   CT Angio Chest PE W and/or Wo Contrast  Result Date: 01/09/2023 CLINICAL DATA:  PE suspected, metastatic pancreatic cancer * Tracking Code: BO * EXAM: CT ANGIOGRAPHY CHEST WITH CONTRAST TECHNIQUE: Multidetector CT imaging of the chest was performed using the standard protocol during bolus administration of intravenous contrast. Multiplanar CT image reconstructions and MIPs were obtained to evaluate the vascular anatomy. RADIATION DOSE REDUCTION: This exam was performed according to the departmental dose-optimization program which includes automated exposure control, adjustment of the mA and/or kV according to patient size and/or use of iterative reconstruction technique. CONTRAST:  75mL OMNIPAQUE IOHEXOL 350 MG/ML SOLN COMPARISON:  09/27/2019 FINDINGS: Cardiovascular: Satisfactory opacification of the pulmonary arteries to the segmental level. No evidence of pulmonary embolism. Mild cardiomegaly. Left and right coronary artery calcifications. Left chest multi lead pacer defibrillator. No pericardial effusion. Aortic atherosclerosis. Right chest port catheter. Mediastinum/Nodes: No enlarged mediastinal, hilar, or axillary lymph nodes. Thyroid gland, trachea, and esophagus demonstrate no significant findings. Lungs/Pleura: Small bilateral pleural effusions and associated atelectasis or consolidation. Acute appearing, heterogeneous airspace opacity of the superior segment left lower lobe (series 4, image 62). Upper Abdomen: Please see separately reported examination  of the abdomen and pelvis Musculoskeletal: No chest wall  abnormality. No acute osseous findings. Disc degenerative disease and bridging osteophytosis throughout the thoracic spine, in keeping with DISH. Review of the MIP images confirms the above findings. IMPRESSION: 1. Negative examination for pulmonary embolism. 2. Small bilateral pleural effusions and associated atelectasis or consolidation. 3. Acute appearing, heterogeneous airspace opacity of the superior segment left lower lobe, consistent with infection or aspiration. 4. Coronary artery disease. Aortic Atherosclerosis (ICD10-I70.0). Electronically Signed   By: Jearld Lesch M.D.   On: 01/09/2023 21:09   CT ABDOMEN PELVIS W CONTRAST  Result Date: 01/09/2023 CLINICAL DATA:  Abdominal pain, pancreatic mass, * Tracking Code: BO * EXAM: CT ABDOMEN AND PELVIS WITH CONTRAST TECHNIQUE: Multidetector CT imaging of the abdomen and pelvis was performed using the standard protocol following bolus administration of intravenous contrast. RADIATION DOSE REDUCTION: This exam was performed according to the departmental dose-optimization program which includes automated exposure control, adjustment of the mA and/or kV according to patient size and/or use of iterative reconstruction technique. CONTRAST:  75mL OMNIPAQUE IOHEXOL 350 MG/ML SOLN COMPARISON:  CT abdomen pelvis, 12/03/2022 FINDINGS: Lower chest: Please see separately reported examination of the chest Hepatobiliary: Multiple low-attenuation liver lesions are unchanged, the largest of which are clearly benign fluid attenuation cysts, others more ill-defined and incompletely characterized, although remains suspicious for small metastases. Distended gallbladder mild gallbladder wall thickening and adjacent pericholecystic fat stranding (series 5, image 43). Sludge in the gallbladder fundus (series 9, image 78). Common bile duct stent has been placed, with diminished, although persistent intrahepatic biliary ductal dilatation (series 5, image 31, series 8, image 74).  Pancreas: Similar appearance of ill-defined hypodense mass in the superior pancreatic head measuring 2.7 x 2.7 cm (series 5, image 37). Mild atrophy of the distal pancreas with prominence of the pancreatic duct measuring up to 0.5 cm, unchanged (series 5, image 35). Spleen: Normal in size without significant abnormality. Adrenals/Urinary Tract: Adrenal glands are unremarkable. Simple, benign bilateral renal cortical cysts, for which no further follow-up or characterization is required. Kidneys are normal, without renal calculi, solid lesion, or hydronephrosis. Bladder is unremarkable. Stomach/Bowel: Status post Roux type gastric bypass. Appendix appears normal. No evidence of bowel wall thickening, distention, or inflammatory changes. Status post sigmoid colon resection and reanastomosis. Vascular/Lymphatic: Aortic atherosclerosis. No enlarged abdominal or pelvic lymph nodes. Reproductive: Prostate brachytherapy. Other: Mesh repair of broad-based midline ventral hernia. Anasarca. No ascites. Musculoskeletal: No acute or significant osseous findings. IMPRESSION: 1. Distended gallbladder with mild gallbladder wall thickening and adjacent pericholecystic fat stranding. Sludge in the gallbladder fundus. Findings are concerning for acute cholecystitis. 2. Common bile duct stent has been placed, with diminished, although persistent intrahepatic biliary ductal dilatation. 3. Similar appearance of ill-defined hypodense mass in the superior pancreatic head, consistent with known pancreatic adenocarcinoma. Mild atrophy of the distal pancreas with prominence of the pancreatic duct measuring up to 0.5 cm, unchanged. 4. Multiple low-attenuation liver lesions are unchanged, the largest of which are clearly benign fluid attenuation cysts, others more ill-defined and incompletely characterized, although in keeping with small metastases. 5. Status post Roux type gastric bypass. 6. Status post sigmoid colon resection and  reanastomosis. 7. Prostate brachytherapy. Aortic Atherosclerosis (ICD10-I70.0). Electronically Signed   By: Jearld Lesch M.D.   On: 01/09/2023 21:05   DG Chest 2 View  Result Date: 01/09/2023 CLINICAL DATA:  Suspected sepsis EXAM: CHEST - 2 VIEW COMPARISON:  Radiograph 11/24/2022 FINDINGS: LEFT-sided pacer overlies normal cardiac silhouette. Port in the anterior chest  wall with tip in distal SVC. Elevation LEFT hemidiaphragm. No effusion, infiltrate or pneumothorax. Hyperinflated lungs. IMPRESSION: 1. No acute cardiopulmonary process. 2. Hyperinflated lungs suggest COPD. Electronically Signed   By: Genevive Bi M.D.   On: 01/09/2023 17:28       Assessment / Plan:    #24 82 year old white male with new diagnosis of pancreatic adenocarcinoma-presented with biliary obstruction, status post ERCP and stent placement 12/13/2022. EUS 12/23/2022 confirmed pancreatic head mass with invasion of SMV, no malignant appearing lymph nodes, 4 cysts visualized in the liver.  FNA positive for adenocarcinoma  Patient had initial session of chemotherapy last week on 01/07/2023, developed progressive weakness and fevers thereafter.  Readmitted  Chest imaging on admission showed an airspace opacity in the left lower lung He also complained of dysuria after admission, UA positive, culture negative so far  He is improving on IV antibiotics  LFTs are normal, HIDA scan yesterday did show some filling of the gallbladder, stent appears patent.  Presentation not consistent with acute cholecystitis  #2 anemia-mild drop in hemoglobin but this is also post chemo.  The anemia predated chemo. He had been off of Eliquis, reinitiated on heparin here Evidence of active bleeding Per oncology consider dose reduction of anticoagulation  Plan; complete a course of IV antibiotics as per medicine team No indication for repeat ERCP at this time, he does have outpatient follow-up arranged for ERCP and stent exchange on 02/07/2023 with  Dr. Meridee Score.  GI will sign off, please call for questions or concerns.     Principal Problem:   Sepsis secondary from pneumonia and acute cholecystitis  (HCC) Active Problems:   Gout   Atrial fibrillation, chronic (HCC)   Pancreatic adenocarcinoma (HCC)   CAP (community acquired pneumonia)   CHF exacerbation (HCC)   Combined systolic and diastolic heart failure (HCC)   COPD with acute exacerbation (HCC)   Acute cholecystitis   Hypokalemia   History of biliary duct stent placement   Pruritus   AKI (acute kidney injury) (HCC)     LOS: 2 days   Metztli Sachdev EsterwoodPA-C  01/11/2023, 2:06 PM

## 2023-01-11 NOTE — Progress Notes (Signed)
   01/10/23 2122  What Happened  Was fall witnessed? No  Was patient injured? Unsure  Patient found on floor  Found by Staff-comment (Mia,NT)  Stated prior activity other (comment) (patient trying to reach and open can drink,he did not think chair was locked. (Per NT,it was locked because she has to unlock it to move chair.))  Provider Notification  Provider Name/Title Gardner,DO  Date Provider Notified 01/10/23  Time Provider Notified 2138  Method of Notification Page  Notification Reason Fall  Provider response See new orders  Date of Provider Response 01/10/23  Time of Provider Response 2138  Follow Up  Family notified No - patient refusal (patient refused to have staff call family,patient himself called his daughter,Stephanie)  Time family notified 2139  Additional tests Yes-comment  Progress note created (see row info) Yes

## 2023-01-12 ENCOUNTER — Encounter: Payer: Self-pay | Admitting: Nutrition

## 2023-01-12 ENCOUNTER — Inpatient Hospital Stay: Payer: Medicare Other | Admitting: Nutrition

## 2023-01-12 DIAGNOSIS — C259 Malignant neoplasm of pancreas, unspecified: Secondary | ICD-10-CM | POA: Diagnosis not present

## 2023-01-12 DIAGNOSIS — J189 Pneumonia, unspecified organism: Secondary | ICD-10-CM | POA: Diagnosis not present

## 2023-01-12 DIAGNOSIS — M1A9XX0 Chronic gout, unspecified, without tophus (tophi): Secondary | ICD-10-CM

## 2023-01-12 DIAGNOSIS — I5043 Acute on chronic combined systolic (congestive) and diastolic (congestive) heart failure: Secondary | ICD-10-CM

## 2023-01-12 DIAGNOSIS — A419 Sepsis, unspecified organism: Secondary | ICD-10-CM | POA: Diagnosis not present

## 2023-01-12 DIAGNOSIS — L299 Pruritus, unspecified: Secondary | ICD-10-CM

## 2023-01-12 DIAGNOSIS — J441 Chronic obstructive pulmonary disease with (acute) exacerbation: Secondary | ICD-10-CM | POA: Diagnosis not present

## 2023-01-12 DIAGNOSIS — N179 Acute kidney failure, unspecified: Secondary | ICD-10-CM

## 2023-01-12 DIAGNOSIS — E876 Hypokalemia: Secondary | ICD-10-CM

## 2023-01-12 LAB — DIFFERENTIAL
Abs Immature Granulocytes: 0.01 10*3/uL (ref 0.00–0.07)
Basophils Absolute: 0 10*3/uL (ref 0.0–0.1)
Basophils Relative: 1 %
Eosinophils Absolute: 0.1 10*3/uL (ref 0.0–0.5)
Eosinophils Relative: 5 %
Immature Granulocytes: 1 %
Lymphocytes Relative: 28 %
Lymphs Abs: 0.6 10*3/uL — ABNORMAL LOW (ref 0.7–4.0)
Monocytes Absolute: 0.1 10*3/uL (ref 0.1–1.0)
Monocytes Relative: 5 %
Neutro Abs: 1.2 10*3/uL — ABNORMAL LOW (ref 1.7–7.7)
Neutrophils Relative %: 60 %

## 2023-01-12 LAB — CBC WITH DIFFERENTIAL/PLATELET
Abs Immature Granulocytes: 0.01 10*3/uL (ref 0.00–0.07)
Basophils Absolute: 0 10*3/uL (ref 0.0–0.1)
Basophils Relative: 1 %
Eosinophils Absolute: 0.1 10*3/uL (ref 0.0–0.5)
Eosinophils Relative: 5 %
HCT: 25.3 % — ABNORMAL LOW (ref 39.0–52.0)
Hemoglobin: 8.1 g/dL — ABNORMAL LOW (ref 13.0–17.0)
Immature Granulocytes: 1 %
Lymphocytes Relative: 28 %
Lymphs Abs: 0.6 10*3/uL — ABNORMAL LOW (ref 0.7–4.0)
MCH: 29.8 pg (ref 26.0–34.0)
MCHC: 32 g/dL (ref 30.0–36.0)
MCV: 93 fL (ref 80.0–100.0)
Monocytes Absolute: 0.1 10*3/uL (ref 0.1–1.0)
Monocytes Relative: 6 %
Neutro Abs: 1.2 10*3/uL — ABNORMAL LOW (ref 1.7–7.7)
Neutrophils Relative %: 59 %
Platelets: 125 10*3/uL — ABNORMAL LOW (ref 150–400)
RBC: 2.72 MIL/uL — ABNORMAL LOW (ref 4.22–5.81)
RDW: 15.2 % (ref 11.5–15.5)
WBC: 2.1 10*3/uL — ABNORMAL LOW (ref 4.0–10.5)
nRBC: 0 % (ref 0.0–0.2)

## 2023-01-12 LAB — CBC
HCT: 25.5 % — ABNORMAL LOW (ref 39.0–52.0)
Hemoglobin: 8.1 g/dL — ABNORMAL LOW (ref 13.0–17.0)
MCH: 29.6 pg (ref 26.0–34.0)
MCHC: 31.8 g/dL (ref 30.0–36.0)
MCV: 93.1 fL (ref 80.0–100.0)
Platelets: 128 10*3/uL — ABNORMAL LOW (ref 150–400)
RBC: 2.74 MIL/uL — ABNORMAL LOW (ref 4.22–5.81)
RDW: 15.2 % (ref 11.5–15.5)
WBC: 2 10*3/uL — ABNORMAL LOW (ref 4.0–10.5)
nRBC: 0 % (ref 0.0–0.2)

## 2023-01-12 LAB — BASIC METABOLIC PANEL
Anion gap: 11 (ref 5–15)
BUN: 21 mg/dL (ref 8–23)
CO2: 23 mmol/L (ref 22–32)
Calcium: 8 mg/dL — ABNORMAL LOW (ref 8.9–10.3)
Chloride: 105 mmol/L (ref 98–111)
Creatinine, Ser: 1.23 mg/dL (ref 0.61–1.24)
GFR, Estimated: 58 mL/min — ABNORMAL LOW (ref >60)
Glucose, Bld: 128 mg/dL — ABNORMAL HIGH (ref 70–99)
Potassium: 3.1 mmol/L — ABNORMAL LOW (ref 3.5–5.1)
Sodium: 139 mmol/L (ref 135–145)

## 2023-01-12 LAB — LEGIONELLA PNEUMOPHILA SEROGP 1 UR AG: L. pneumophila Serogp 1 Ur Ag: NEGATIVE

## 2023-01-12 LAB — MAGNESIUM: Magnesium: 1.7 mg/dL (ref 1.7–2.4)

## 2023-01-12 MED ORDER — MAGNESIUM SULFATE 2 GM/50ML IV SOLN
2.0000 g | Freq: Once | INTRAVENOUS | Status: AC
  Administered 2023-01-12: 2 g via INTRAVENOUS
  Filled 2023-01-12: qty 50

## 2023-01-12 MED ORDER — POTASSIUM CHLORIDE CRYS ER 20 MEQ PO TBCR
40.0000 meq | EXTENDED_RELEASE_TABLET | Freq: Two times a day (BID) | ORAL | Status: AC
  Administered 2023-01-12 (×2): 40 meq via ORAL
  Filled 2023-01-12 (×2): qty 2

## 2023-01-12 NOTE — Progress Notes (Signed)
PROGRESS NOTE    Marcus [name]  GUY:[id number] DOB: Apr 14, 1941 DOA: 01/09/2023 PCP: [name], NP   Brief Narrative:  This is a 82 year old man medical history of prostate cancer s/p radioactive seed implant in 2020, history of colon cancer status post right colectomy in 1989, COPD, hypertension, paroxysmal atrial fibrillation (not currently on Eliquis since last hospital discharge from 12/15/2022 for EGD and EUS biopsy), arthritis, gout, GERD, OSA, hyperlipidemia, chronic biliary ductal obstruction s/p stent placement 12/15/2022 due to malignant neoplasm, , recently diagnosed with pancreatic cancer 2 weeks ago and received first session of chemotherapy on 01/07/2023 presented with emergency department complaining of generalized weakness, lower extremity edema, and exertional shortness of breath.  Patient was admitted for sepsis secondary to community-acquired pneumonia as well as concerns for combined CHF exacerbation.  GI consulted for ERCP and bile duct exchange, but they had ordered HIDA scan 7/8 with no concerning findings and therefore did not require any procedures.  Diet has been resumed as well as Eliquis and heparin drip discontinued.  He continues to have some urethral pain after Foley insertion and has concerns for UTI as well as some mild Foley trauma.  He has been started on Pyridium for his discomfort.   Gwyndolyn Kaufman status is improving slowly and we will repeat his chest x-ray and amatory home O2 screen prior to discharge and likely can be discharged in the next 24 to 48 hours.  PT oh to recommending home health.  Assessment and Plan:  Sepsis secondary from pneumonia Community-acquired pneumonia (patient is immunocompromised and recent hospital admission concern for pneumonia associated with Staph aureus and pseudomonas) -Patient comes in with complaining of fever, chills, shortness of breath, sputum production over the course of last 3 days. -Initial presentation patient found febrile  102.9 degrees Fahrenheit, pressure 129/49 and heart rate 84.  WBC 5.  As patient has active cancer currently on chemotherapy leukocytosis is not evident.  Lactic acid 1.5 WNL. -Chest x-ray showed evidence of COPD. -CTA chest showed small bilateral pleural effusion, acute appearing heterogenous airspace opacity of the superior segment of the left lobe consistent with infection/aspiration. -Given patient is immunocompromised and recent hospital admission 3 weeks ago there is concern for stay for low and Pseudomonas associated pneumonia versus complete tach or pneumonia as well. -Chemo-Port site looks clean no concern for infection. -UA unremarkable. -Sepsis secondary from in the setting of pneumonia and acute cholecystitis. -In the ED patient was getting IV fluid LR 150 cc/h per sepsis protocol however physical exam showed evidence of lower extremity pitting edema 2+ and echo showed reduced EF less than 45% there is concern for CHF exacerbation as well.  So I have to stop the fluid. -Continue broad-spectrum antibiotic vancomycin and cefepime per pharmacy protocol.  Continue azithromycin for atypical coverage.  Anticipate will be able to discharge on oral antibiotics in the next 1-2 days. -Urine strep pneumonia negative and Legionella pending -Appears to be back on room air will need an amatory home O2 screen prior to discharge will repeat chest x-ray in the a.m.     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  He is also endorsing orthopnea, dyspnea and PND.  Physical exam showed bilateral lower extremity pitting edema 2+ -Per chart review echo from 12/14/2022 reduced EF 45 to 50% and grade 1 diastolic dysfunction. -As mentioned above patient was receiving IV fluid LR 150  cc/h for sepsis protocol however as physical exam and history evidence  for CHF exacerbation -Blood pressure remains soft.  Intake/Output Summary (Last 24 hours) at 01/12/2023 1847 Last data filed at 01/12/2023 1841 Gross per 24 hour  Intake 1334.71 ml  Output 5200 ml  Net -3865.29 ml  -Increase IV diuresis to 40 mg twice daily and monitor carefully for goal output of 2-3 L daily -Decreased fluid intake to 1500 mL daily -No need for repeat 2D echocardiogram   Fall -Patient fell overnight on 7/8 with no noted injuries on imaging -Currently on fall precautions -PT/OT evaluation ordered and commending home health   History of hypertension -At home patient is on amlodipine 10 mg daily.  Currently blood pressure is borderline soft in the setting of sepsis.  Also he has CHF exacerbation at the same time. -Continuing Lasix 40 mg mg IV twice daily for CHF exacerbation.      COPD exacerbation -Patient reported history of COPD but not any treatment at home? -Chest x-ray showed hypertension for short of the lung suggested COPD. -Patient reporting worsening shortness of breath and mucoid sputum production.  It can be COPD or CHF exacerbation or combined both. Also pneumonia can exacerbate the COPD as well. -Starting DuoNeb every 6 hours scheduled and Xopenex as needed. -In the setting of pneumonia and acute cholecystitis holding prednisone as it can exacerbate acute inflammatory state. -Starting azithromycin 500 mg daily for 5 days to control inflammation for COPD exacerbation and also covering atypical for CAP/ -Continue supplemental oxygen with O2 sat above 92% -Continue supplemental oxygen, incentive spirometry, flutter valve, and chest physiotherapy -Repeat chest x-ray in the a.m. he will need an amatory home O2 screen prior to discharge   Acute cholecystitis, resolved and ruled out Biliary stenosis status post stent placement -CT abdomen pelvis showed Distended gallbladder with mild gallbladder wall thickening and adjacent pericholecystic fat stranding. Sludge  in the gallbladder fundus. Findings are concerning for acute cholecystitis.   -Ultrasound similar finding. -Patient had new diagnosis of pancreatic head adenocarcinoma as mentioned above and recent hospital admission showed biliary duct stenosis status post stent placement on 12/15/2022. -Consulted with G and they evaluated and recommended no repeating ERCP at this time and recommends continued follow-up in outpatient setting for follow-up ERCP and stent exchange on 02/07/2023 Dr. Meridee Score -Appreciate GI input and recommendation and HIDA scan yesterday did show some filling of the gallbladder and stents appeared patent and presentation was not consistent with acute cholecystitis and the GI team feels that his presentation is very unlikely that is related to biliary infection -GI recommends resuming oral anticoagulation at this time.  Pancreatic Head Adenocarcinoma ( Diagnosed on 12/29/2022 and first chemotherapy on 01/08/2023) -As mentioned above patient has been diagnosed with pancreatic adenocarcinoma per biopsy.  Started on chemotherapy first dose on 12/27/2022. -Appreciate Oncology Evaluation commending outpatient follow-up on 01/21/2023 -On IV antibiotics with cefepime and azithromycin for above   Elevated Troponin -High sensitive troponin 19 slight elevated.  Patient denies any chest pain.  EKG showed sinus rhythm with premature atrial complex and nonspecific T wave abnormality.  No concern for ACS at this time. -Continue to trend troponin. -Continue Monitoring on Telemetry. -Patient had an echo 1 month ago no need to repeat it this visit    Chronic Atrial Fibrillation Patient's CHA2DS2-VASc score is 3. -Per chart review when patient was last discharged from the hospital on 12/22/2022 plan was to hold to Eliquis until ERCP then resume it however patient reported that he  never resumed his Eliquis as his oncologist recommended to hold it given he is going to have chemotherapy and may develop  pancytopenia. -Dr. Sherryll Burger did reach out to on-call cardiology Dr.Tannu, given he has CHA2DS2-VASc score is 3 he supposed to be on anticoagulation. -Appreciate oncology recommendations regarding need for continuation of Eliquis and recommends changing monitor hemoglobin closely while on apixaban and consider a dose reduction in his age and dizziness.   Hypokalemia -Patient's K+ Level Trend: Recent Labs  Lab 12/23/22 1235 12/29/22 1028 01/07/23 1159 01/09/23 1635 01/10/23 0144 01/11/23 0306 01/12/23 0500  K 3.6 4.0 3.6 3.3* 3.2* 3.1* 3.1*  -Replete with po Potassium Chloride 40 mEQ BID -Continue to Monitor and Replete as Necessary -Repeat CMP in the AM   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 -BUN/Cr Trend: Recent Labs  Lab 12/23/22 1235 12/29/22 1028 01/07/23 1159 01/09/23 1635 01/10/23 0144 01/11/23 0306 01/12/23 0500  BUN 30* 22 21 17 20 20 21   CREATININE 1.00 1.00 0.85 1.10 1.15 1.17 1.23  -He is initially on IV fluids for sepsis management but at the same time he had CHF exacerbation as well as IV fluids had been held and is getting IV diuretics -Continue to monitor urine output and strict I's and O's and Daily Weights  Intake/Output Summary (Last 24 hours) at 01/12/2023 1839 Last data filed at 01/12/2023 1820 Gross per 24 hour  Intake 1334.71 ml  Output 3600 ml  Net -2265.29 ml   -Avoid Nephrotoxic Medications, Contrast Dyes, Hypotension and Dehydration to Ensure Adequate Renal Perfusion and will need to Renally Adjust Meds -Continue to Monitor and Trend Renal Function carefully and repeat CMP in the AM   History of Gout -Continue Allopurinol 300 mg daily   BPH -Patient reporting that every time he wants to go to bathroom to pee only few drops of urine comes out.  He has significant urinary urgency and hesitancy. -Resumed home Tamsulosin 0.4  mg daily -Removed Foley due to dysuria   GERD/GI prophylaxis -Continue with Pantoprazole 40 mg p.o. daily   Dysuria with concerns for UTI and mild Foley trauma -Continue Pyridium for ongoing pain -Continue current antibiotics IV cefepime and oral azithromycin -Urinalysis done and showed a hazy appearance with amber color urine, moderate hemoglobin, moderate leukocytes, positive nitrites, few bacteria, present mucus, greater than 50 RBCs per high-power field, greater than 50 WBCs -Urine cultures showing no growth and unclear if he is already on antibiotics when it was obtained  Pancytopenia -CBC Trend: Recent Labs  Lab 12/24/22 0935 12/29/22 1028 01/07/23 1159 01/09/23 1635 01/10/23 0144 01/11/23 0306 01/12/23 0500  WBC 11.4* 5.7 4.7 5.0 3.9* 2.8* 2.0*  HGB 8.5* 8.7* 9.0* 8.9* 8.1* 7.5* 8.1*  HCT 25.4* 26.7* 27.7* 28.1* 24.9* 23.3* 25.5*  MCV 91.4 93.7 93.3 93.7 92.6 92.8 93.1  PLT 247 266 210 160 131* 126* 128*  -In the setting of recent Chemotherapy  Hypoalbuminemia -Patient's Albumin Trend: Recent Labs  Lab 12/17/22 0822 12/23/22 1235 12/29/22 1028 01/07/23 1159 01/09/23 1635 01/10/23 0144 01/11/23 0306  ALBUMIN 3.4* 2.7* 3.2* 3.5 2.6* 2.2* 2.1*  -Continue to Monitor and Trend and repeat CMP in the AM  Obesity -Complicates overall prognosis and care -Estimated body mass index is 34.59 kg/m as calculated from the following:   Height as of this encounter: 5\' 8"  (1.727 m).   Weight as of this encounter:  103.2 kg.  -Weight Loss and Dietary Counseling given    DVT prophylaxis: SCDs Start: 01/09/23 2238 apixaban (ELIQUIS) tablet 5 mg    Code Status: Full Code Family Communication: No family currently at bedside  Disposition Plan:  Level of care: Telemetry Medical Status is: Inpatient Remains inpatient appropriate because: Dissipating discharging home in the next 24 to 48 hours   Consultants:  Medical Oncology Gastroenterology  Procedures:  As delineated  as above  Antimicrobials:  Anti-infectives (From admission, onward)    Start     Dose/Rate Route Frequency Ordered Stop   01/10/23 2000  vancomycin (VANCOCIN) IVPB 1000 mg/200 mL premix        1,000 mg 200 mL/hr over 60 Minutes Intravenous Every 24 hours 01/09/23 1927 01/16/23 1959   01/10/23 0800  ceFEPIme (MAXIPIME) 2 g in sodium chloride 0.9 % 100 mL IVPB        2 g 200 mL/hr over 30 Minutes Intravenous Every 12 hours 01/09/23 1927 01/16/23 1959   01/10/23 0400  azithromycin (ZITHROMAX) 500 mg in sodium chloride 0.9 % 250 mL IVPB        500 mg 250 mL/hr over 60 Minutes Intravenous Every 24 hours 01/10/23 0101 01/15/23 0359   01/09/23 2300  vancomycin (VANCOCIN) IVPB 1000 mg/200 mL premix  Status:  Discontinued        1,000 mg 200 mL/hr over 60 Minutes Intravenous  Once 01/09/23 2246 01/09/23 2249   01/09/23 2300  ceFEPIme (MAXIPIME) 2 g in sodium chloride 0.9 % 100 mL IVPB  Status:  Discontinued        2 g 200 mL/hr over 30 Minutes Intravenous  Once 01/09/23 2246 01/09/23 2250   01/09/23 1830  vancomycin (VANCOREADY) IVPB 2000 mg/400 mL        2,000 mg 200 mL/hr over 120 Minutes Intravenous  Once 01/09/23 1815 01/09/23 2252   01/09/23 1815  ceFEPIme (MAXIPIME) 2 g in sodium chloride 0.9 % 100 mL IVPB        2 g 200 mL/hr over 30 Minutes Intravenous  Once 01/09/23 1812 01/09/23 2116   01/09/23 1815  metroNIDAZOLE (FLAGYL) IVPB 500 mg        500 mg 100 mL/hr over 60 Minutes Intravenous  Once 01/09/23 1812 01/09/23 2117   01/09/23 1815  vancomycin (VANCOCIN) IVPB 1000 mg/200 mL premix  Status:  Discontinued        1,000 mg 200 mL/hr over 60 Minutes Intravenous  Once 01/09/23 1812 01/09/23 1815       Subjective: Seen and examined at bedside he thinks he is doing okay.  Sitting in the chair at bedside and states that he is "being done" when he slipped out of the chair and to the floor.  Continues to get diuresis.  Respiratory status is stable.  No nausea or vomiting.  No other  concerns or complaints this time.  Objective: Vitals:   01/11/23 2032 01/12/23 0558 01/12/23 0853 01/12/23 1603  BP: 124/64 (!) 122/57 (!) 134/41 118/75  Pulse: 76 64 64 68  Resp: 18 18 18 18   Temp: 98.3 F (36.8 C) 98.1 F (36.7 C) 98.2 F (36.8 C) 97.8 F (36.6 C)  TempSrc:    Oral  SpO2: 95% 92% 99% 99%  Weight:      Height:        Intake/Output Summary (Last 24 hours) at 01/12/2023 1851 Last data filed at 01/12/2023 1841 Gross per 24 hour  Intake 1334.71 ml  Output 5200 ml  Net -  3865.29 ml   Filed Weights   01/09/23 1630 01/10/23 0136  Weight: 100 kg 103.2 kg   Examination: Physical Exam:  Constitutional: WN/WD obese Caucasian male in no acute distress appears calm sitting in the chair at bedside Respiratory: Diminished to auscultation bilaterally with some coarse breath sounds and has some crackles., no wheezing, rales, rhonchi. Normal respiratory effort and patient is not tachypenic. No accessory muscle use.  Unlabored breathing Cardiovascular: RRR, no murmurs / rubs / gallops. S1 and S2 auscultated.  1+ lower extremity edema Abdomen: Soft, non-tender, distended secondary to body habitus. Bowel sounds positive.  GU: Deferred. Musculoskeletal: No clubbing / cyanosis of digits/nails. No joint deformity upper and lower extremities..  Skin: No rashes, lesions, ulcers on a limited. No induration; Warm and dry.  Neurologic: CN 2-12 grossly intact with no focal deficits. Romberg sign and cerebellar reflexes not assessed.  Psychiatric: Normal judgment and insight. Alert and oriented x 3. Normal mood and appropriate affect.   Data Reviewed: I have personally reviewed following labs and imaging studies  CBC: Recent Labs  Lab 01/07/23 1159 01/09/23 1635 01/10/23 0144 01/11/23 0306 01/12/23 0500  WBC 4.7 5.0 3.9* 2.8* 2.1*  2.0*  NEUTROABS 3.0 4.4  --  2.2 1.2*  HGB 9.0* 8.9* 8.1* 7.5* 8.1*  8.1*  HCT 27.7* 28.1* 24.9* 23.3* 25.3*  25.5*  MCV 93.3 93.7 92.6 92.8  93.0  93.1  PLT 210 160 131* 126* 125*  128*   Basic Metabolic Panel: Recent Labs  Lab 01/07/23 1159 01/09/23 1635 01/09/23 1809 01/10/23 0144 01/11/23 0306 01/12/23 0500  NA 142 135  --  137 134* 139  K 3.6 3.3*  --  3.2* 3.1* 3.1*  CL 111 105  --  107 106 105  CO2 23 21*  --  20* 21* 23  GLUCOSE 91 145*  --  147* 117* 128*  BUN 21 17  --  20 20 21   CREATININE 0.85 1.10  --  1.15 1.17 1.23  CALCIUM 9.0 7.9*  --  7.8* 7.6* 8.0*  MG  --   --  1.9 1.7 1.8 1.7  PHOS  --   --   --  2.1*  --   --    GFR: Estimated Creatinine Clearance: 53 mL/min (by C-G formula based on SCr of 1.23 mg/dL). Liver Function Tests: Recent Labs  Lab 01/07/23 1159 01/09/23 1635 01/10/23 0144 01/11/23 0306  AST 18 26 21 23   ALT 21 20 22 22   ALKPHOS 168* 140* 117 101  BILITOT 1.2 1.5* 1.7* 1.1  PROT 5.8* 5.7* 5.3* 5.0*  ALBUMIN 3.5 2.6* 2.2* 2.1*   Recent Labs  Lab 01/09/23 1809  LIPASE 25   No results for input(s): "AMMONIA" in the last 168 hours. Coagulation Profile: Recent Labs  Lab 01/09/23 1635  INR 1.3*   Cardiac Enzymes: No results for input(s): "CKTOTAL", "CKMB", "CKMBINDEX", "TROPONINI" in the last 168 hours. BNP (last 3 results) No results for input(s): "PROBNP" in the last 8760 hours. HbA1C: No results for input(s): "HGBA1C" in the last 72 hours. CBG: No results for input(s): "GLUCAP" in the last 168 hours. Lipid Profile: No results for input(s): "CHOL", "HDL", "LDLCALC", "TRIG", "CHOLHDL", "LDLDIRECT" in the last 72 hours. Thyroid Function Tests: No results for input(s): "TSH", "T4TOTAL", "FREET4", "T3FREE", "THYROIDAB" in the last 72 hours. Anemia Panel: No results for input(s): "VITAMINB12", "FOLATE", "FERRITIN", "TIBC", "IRON", "RETICCTPCT" in the last 72 hours. Sepsis Labs: Recent Labs  Lab 01/09/23 1635 01/09/23 1835 01/10/23 0144  PROCALCITON  --   --  0.30  LATICACIDVEN 1.5 0.9  --    Recent Results (from the past 240 hour(s))  Culture, blood (Routine  x 2)     Status: None (Preliminary result)   Collection Time: 01/09/23  4:53 PM   Specimen: BLOOD  Result Value Ref Range Status   Specimen Description BLOOD RIGHT ANTECUBITAL  Final   Special Requests   Final    BOTTLES DRAWN AEROBIC AND ANAEROBIC Blood Culture adequate volume   Culture   Final    NO GROWTH 3 DAYS Performed at [facility], [address], [place], [place] [address]    Report Status PENDING  Incomplete  Respiratory (~20 pathogens) panel by PCR     Status: None   Collection Time: 01/09/23  6:13 PM   Specimen: Nasopharyngeal Swab; Respiratory  Result Value Ref Range Status   Adenovirus NOT DETECTED NOT DETECTED Final   Coronavirus 229E NOT DETECTED NOT DETECTED Final    Comment: (NOTE) The Coronavirus on the Respiratory Panel, DOES NOT test for the novel  Coronavirus (2019 nCoV)    Coronavirus HKU1 NOT DETECTED NOT DETECTED Final   Coronavirus NL63 NOT DETECTED NOT DETECTED Final   Coronavirus OC43 NOT DETECTED NOT DETECTED Final   Metapneumovirus NOT DETECTED NOT DETECTED Final   Rhinovirus / Enterovirus NOT DETECTED NOT DETECTED Final   Influenza A NOT DETECTED NOT DETECTED Final   Influenza B NOT DETECTED NOT DETECTED Final   Parainfluenza Virus 1 NOT DETECTED NOT DETECTED Final   Parainfluenza Virus 2 NOT DETECTED NOT DETECTED Final   Parainfluenza Virus 3 NOT DETECTED NOT DETECTED Final   Parainfluenza Virus 4 NOT DETECTED NOT DETECTED Final   Respiratory Syncytial Virus NOT DETECTED NOT DETECTED Final   Bordetella pertussis NOT DETECTED NOT DETECTED Final   Bordetella Parapertussis NOT DETECTED NOT DETECTED Final   Chlamydophila pneumoniae NOT DETECTED NOT DETECTED Final   Mycoplasma pneumoniae NOT DETECTED NOT DETECTED Final    Comment: Performed at [facility], [address] [place], [place] [address]  Culture, blood (Routine x 2)     Status: None (Preliminary result)   Collection Time: 01/09/23  6:53 PM   Specimen: BLOOD  Result Value  Ref Range Status   Specimen Description BLOOD RIGHT ANTECUBITAL  Final   Special Requests   Final    BOTTLES DRAWN AEROBIC AND ANAEROBIC Blood Culture results may not be optimal due to an excessive volume of blood received in culture bottles   Culture   Final    NO GROWTH 3 DAYS Performed at [facility], [address], [place], [place] [address]    Report Status PENDING  Incomplete  Urine Culture     Status: None   Collection Time: 01/10/23 11:32 AM   Specimen: Urine, Clean Catch  Result Value Ref Range Status   Specimen Description URINE, CLEAN CATCH  Final   Special Requests NONE  Final   Culture   Final    NO GROWTH Performed at [facility], [address], [place], [place] [address]    Report Status 01/11/2023 FINAL  Final  MRSA Next Gen by PCR, Nasal     Status: None   Collection Time: 01/11/23 11:54 AM   Specimen: Nasal Mucosa; Nasal Swab  Result Value Ref Range Status   MRSA by PCR Next Gen NOT DETECTED NOT DETECTED Final    Comment: (NOTE) The GeneXpert MRSA Assay (FDA approved for  NASAL specimens only), is one component of a comprehensive MRSA colonization surveillance program. It is not intended to diagnose MRSA infection nor to guide or monitor treatment for MRSA infections. Test performance is not FDA approved in patients less than 8 years old. Performed at [facility], [address], [place], [place] [address]     Radiology Studies: DG Sacrum/Coccyx  Result Date: 01/10/2023 CLINICAL DATA:  Fall EXAM: SACRUM AND COCCYX - 2+ VIEW COMPARISON:  CT abdomen and pelvis 01/09/2023 FINDINGS: There is no acute fracture or dislocation identified. Healed mid sacral fracture with angulation appears unchanged. There are degenerative changes of the sacroiliac joints. Prostate radiotherapy seeds are present. There surgical clips in the lower abdomen. IMPRESSION: 1. No acute fracture or dislocation identified. 2. Healed mid sacral fracture. Electronically  Signed   By: Darliss Cheney M.D.   On: 01/10/2023 23:52   CT HEAD WO CONTRAST ( )  Result Date: 01/10/2023 CLINICAL DATA:  Fall, head trauma EXAM: CT HEAD WITHOUT CONTRAST TECHNIQUE: Contiguous axial images were obtained from the base of the skull through the vertex without intravenous contrast. RADIATION DOSE REDUCTION: This exam was performed according to the departmental dose-optimization program which includes automated exposure control, adjustment of the mA and/or kV according to patient size and/or use of iterative reconstruction technique. COMPARISON:  12/13/2022 FINDINGS: Brain: Age related volume loss. No acute intracranial abnormality. Specifically, no hemorrhage, hydrocephalus, mass lesion, acute infarction, or significant intracranial injury. Vascular: No hyperdense vessel or unexpected calcification. Skull: No acute calvarial abnormality. Sinuses/Orbits: No acute findings Other: None IMPRESSION: No acute intracranial abnormality. Electronically Signed   By: Charlett Nose M.D.   On: 01/10/2023 23:46   DG Wrist 2 Views Right  Result Date: 01/10/2023 CLINICAL DATA:  Fall EXAM: RIGHT WRIST - 2 VIEW COMPARISON:  None Available. FINDINGS: Degenerative changes at the 1st carpometacarpal joint. No acute bony abnormality. Specifically, no fracture, subluxation, or dislocation. IMPRESSION: No acute bony abnormality. Electronically Signed   By: Charlett Nose M.D.   On: 01/10/2023 23:45    Scheduled Meds:  allopurinol  300 mg Oral Daily   apixaban  5 mg Oral BID   calcium-vitamin D  1 tablet Oral Daily   Chlorhexidine Gluconate Cloth  6 each Topical Daily   furosemide  40 mg Intravenous BID   guaiFENesin  600 mg Oral BID   lidocaine  1 Application Urethral Once   melatonin  10 mg Oral QHS   multivitamin with minerals  1 tablet Oral q AM   pantoprazole  40 mg Oral Daily   phenazopyridine  200 mg Oral TID WC   potassium chloride  40 mEq Oral BID   tamsulosin  0.4 mg Oral Daily   Continuous  Infusions:  sodium chloride 10 mL/hr at 01/11/23 1946   azithromycin Stopped (01/12/23 0622)   ceFEPime (MAXIPIME) IV 2 g (01/12/23 1027)   vancomycin Stopped (01/11/23 2135)    LOS: 3 days   Marguerita Merles, DO [facility] Available via Epic secure chat 7am-7pm After these hours, please refer to coverage provider listed on [email or web address] 01/12/2023, 6:51 PM

## 2023-01-12 NOTE — Plan of Care (Signed)

## 2023-01-12 NOTE — Evaluation (Signed)
Physical Therapy Evaluation Patient Details Name: Marcus [name] MRN: [id number] DOB: 03-22-1941 Today's Date: 01/12/2023  History of Present Illness  Pt presenting with weakness, DOE, BLE edema, chills and diarrhea 01/09/2023. Recently diagnosed with pancreatic cancer; first chemo treat last week. Admitted with sepsis secondary to CAP and concerns for combined CHF exacerbation. US abdomen demonstrating gallbladder sludge with markedly distended gallbladder and multiple echogenic liver lesions concerning for potential hepatic metastasis. CT head negative. PMH significant for prostate cancer s/p radioactive seed implant 2020, colon cancer s/p right colectomy in 1989, COPD, hypertension, paroxysmal afib (not currently on Eliquis since last hospital discharge from 12/15/2022 for EGD and EUS biopsy), arthritis, gout, GERD, OSA, HLD, chronic biliary ductal obstruction s/p stent placement 12/15/2022 due to malignant neoplasm.   Clinical Impression  Prior to admission, patient endorsed independence with functional mobility and ADLs. Supported by his son and multiple family members whom are available to assist him 24 hours throughout the day. He reports occasional use of cane with community ambulation but able to walk household distances without AD. Currently, patient is able to demonstrate bed mobility, transfers and ambulation without physical assistance from PT. Gait training presented trendelenburg pattern with significant right lateral lean. Pt reports gait presentation is from a leg length discrepancy (RLE shorter than LLE), however appears to be weak in gluteus medius as well  Patient will continue to benefit from continued acute PT. Following today's session, PT recommends patient discharge to home with family support as needed.       Assistance Recommended at Discharge Set up Supervision/Assistance  If plan is discharge home, recommend the following:  Can travel by private vehicle  A little help with  walking and/or transfers;Assist for transportation;Help with stairs or ramp for entrance        Equipment Recommendations None recommended by PT  Recommendations for Other Services       Functional Status Assessment Patient has had a recent decline in their functional status and demonstrates the ability to make significant improvements in function in a reasonable and predictable amount of time.     Precautions / Restrictions Precautions Precautions: Fall Restrictions Weight Bearing Restrictions: No      Mobility  Bed Mobility Overal bed mobility: Modified Independent             General bed mobility comments: HOB elevated, patient able to transition to EOB without physical assistance from PT.    Transfers Overall transfer level: Needs assistance Equipment used: Rolling walker (2 wheels) Transfers: Sit to/from Stand Sit to Stand: Supervision           General transfer comment: Close supervision provided but patient able to perform without PT assistance; used RW to steady once in upright stance.    Ambulation/Gait Ambulation/Gait assistance: Min guard Gait Distance (Feet): 200 Feet Assistive device: Rolling walker (2 wheels) Gait Pattern/deviations: Step-through pattern, Decreased step length - left, Decreased stride length, Decreased step length - right, Decreased stance time - left, Trunk flexed, Trendelenburg Gait velocity: Decreased Gait velocity interpretation: <1.8 ft/sec, indicate of risk for recurrent falls   General Gait Details: Presented with step-through pattern and decreased velocity with RW support. Trendelenburg pattern with right lateral lean, noted weakness in left hip.  Patient reports leg length discrepancy is reason for is limp.  Stairs            Wheelchair Mobility     Tilt Bed    Modified Rankin (Stroke Patients Only)       Balance  Overall balance assessment: Needs assistance Sitting-balance support: No upper extremity  supported, Feet supported Sitting balance-Leahy Scale: Good     Standing balance support: Bilateral upper extremity supported, No upper extremity supported, During functional activity Standing balance-Leahy Scale: Poor Standing balance comment: Reliant on RW during functional mobility.                             Pertinent Vitals/Pain Pain Assessment Pain Assessment: No/denies pain    Home Living Family/patient expects to be discharged to:: Private residence Living Arrangements: Children Available Help at Discharge: Family;Available 24 hours/day Type of Home: House Home Access: Stairs to enter Entrance Stairs-Rails: Left Entrance Stairs-Number of Steps: 3   Home Layout: One level Home Equipment: Cane - single point;Shower seat - built in;Grab bars - tub/shower;Hand held shower head      Prior Function Prior Level of Function : Independent/Modified Independent             Mobility Comments: Occasional use of cane; able to walk house hold distances without cane. ADLs Comments: Pt reports independent and driving     Hand Dominance   Dominant Hand: Right    Extremity/Trunk Assessment   Upper Extremity Assessment Upper Extremity Assessment: Overall WFL for tasks assessed    Lower Extremity Assessment Lower Extremity Assessment: Generalized weakness;RLE deficits/detail RLE Deficits / Details: Pt reports leg length discrepancy, RLE shorter than the LLE.    Cervical / Trunk Assessment Cervical / Trunk Assessment: Normal  Communication   Communication: No difficulties  Cognition Arousal/Alertness: Awake/alert Behavior During Therapy: WFL for tasks assessed/performed Overall Cognitive Status: Impaired/Different from baseline Area of Impairment: Awareness, Safety/judgement                         Safety/Judgement: Decreased awareness of safety Awareness: Emergent   General Comments: Pleasant throughout session.        General Comments       Exercises     Assessment/Plan    PT Assessment Patient needs continued PT services  PT Problem List Decreased strength;Decreased range of motion;Decreased activity tolerance;Decreased balance;Decreased mobility;Decreased coordination;Decreased cognition;Decreased knowledge of use of DME;Decreased safety awareness;Decreased knowledge of precautions       PT Treatment Interventions DME instruction;Gait training;Stair training;Functional mobility training;Therapeutic activities;Therapeutic exercise;Balance training;Neuromuscular re-education    PT Goals (Current goals can be found in the Care Plan section)  Acute Rehab PT Goals Patient Stated Goal: Patient would like to return home to family PT Goal Formulation: With patient Time For Goal Achievement: 01/26/23 Potential to Achieve Goals: Fair    Frequency Min 3X/week     Co-evaluation               AM-PAC PT "6 Clicks" Mobility  Outcome Measure Help needed turning from your back to your side while in a flat bed without using bedrails?: None Help needed moving from lying on your back to sitting on the side of a flat bed without using bedrails?: None Help needed moving to and from a bed to a chair (including a wheelchair)?: None Help needed standing up from a chair using your arms (e.g., wheelchair or bedside chair)?: A Little Help needed to walk in hospital room?: A Little Help needed climbing 3-5 steps with a railing? : A Little 6 Click Score: 21    End of Session Equipment Utilized During Treatment: Gait belt Activity Tolerance: Patient tolerated treatment well Patient left: in chair;with call  bell/phone within reach;with chair alarm set Nurse Communication: Mobility status PT Visit Diagnosis: Unsteadiness on feet (R26.81);Other abnormalities of gait and mobility (R26.89);Muscle weakness (generalized) (M62.81);Difficulty in walking, not elsewhere classified (R26.2)    Time: 1610-9604 PT Time Calculation (min)  (ACUTE ONLY): 21 min   Charges:   PT Evaluation $PT Eval Moderate Complexity: 1 Mod   PT General Charges $$ ACUTE PT VISIT: 1 Visit         Christene Lye, SPT Acute Rehabilitation Services [phone] Secure chat preferred    Christene Lye 01/12/2023, 4:24 PM

## 2023-01-12 NOTE — Progress Notes (Signed)
Nutrition appointment cancelled. Patient currently in the hospital.

## 2023-01-12 NOTE — Progress Notes (Signed)
IP PROGRESS NOTE  Subjective:   Marcus [name] reports feeling well.  No dyspnea. Dysuria has improved.  Objective: Vital signs in last 24 hours: Blood pressure (!) 122/57, pulse 64, temperature 98.1 F (36.7 C), resp. rate 18, height 5\' 8"  (1.727 m), weight 227 lb 8.2 oz (103.2 kg), SpO2 92 %.  Intake/Output from previous day: 07/09 0701 - 07/10 0700 In: 984.7 [P.O.:480; I.V.:104.7; IV Piggyback:400] Out: 5000 [Urine:5000]  Physical Exam:  HEENT: No thrush or ulcers, sclera anicteric Lungs: Decreased breath sounds at the lower posterior chest bilaterally, no respiratory distress Cardiac: Regular rate and rhythm Abdomen: No hepatosplenomegaly, nontender, no mass Extremities: No leg edema   Portacath/PICC-without erythema  Lab Results: Recent Labs    01/11/23 0306 01/12/23 0500  WBC 2.8* 2.0*  HGB 7.5* 8.1*  HCT 23.3* 25.5*  PLT 126* 128*    BMET Recent Labs    01/11/23 0306 01/12/23 0500  NA 134* 139  K 3.1* 3.1*  CL 106 105  CO2 21* 23  GLUCOSE 117* 128*  BUN 20 21  CREATININE 1.17 1.23  CALCIUM 7.6* 8.0*    Lab Results  Component Value Date   CEA1 6.4 (H) 12/11/2022   CEA 1.1 03/03/2010   CAN199 935 (H) 01/07/2023    Studies/Results: DG Sacrum/Coccyx  Result Date: 01/10/2023 CLINICAL DATA:  Fall EXAM: SACRUM AND COCCYX - 2+ VIEW COMPARISON:  CT abdomen and pelvis 01/09/2023 FINDINGS: There is no acute fracture or dislocation identified. Healed mid sacral fracture with angulation appears unchanged. There are degenerative changes of the sacroiliac joints. Prostate radiotherapy seeds are present. There surgical clips in the lower abdomen. IMPRESSION: 1. No acute fracture or dislocation identified. 2. Healed mid sacral fracture. Electronically Signed   By: Darliss Cheney M.D.   On: 01/10/2023 23:52   CT HEAD WO CONTRAST ( )  Result Date: 01/10/2023 CLINICAL DATA:  Fall, head trauma EXAM: CT HEAD WITHOUT CONTRAST TECHNIQUE: Contiguous axial images were  obtained from the base of the skull through the vertex without intravenous contrast. RADIATION DOSE REDUCTION: This exam was performed according to the departmental dose-optimization program which includes automated exposure control, adjustment of the mA and/or kV according to patient size and/or use of iterative reconstruction technique. COMPARISON:  12/13/2022 FINDINGS: Brain: Age related volume loss. No acute intracranial abnormality. Specifically, no hemorrhage, hydrocephalus, mass lesion, acute infarction, or significant intracranial injury. Vascular: No hyperdense vessel or unexpected calcification. Skull: No acute calvarial abnormality. Sinuses/Orbits: No acute findings Other: None IMPRESSION: No acute intracranial abnormality. Electronically Signed   By: Charlett Nose M.D.   On: 01/10/2023 23:46   DG Wrist 2 Views Right  Result Date: 01/10/2023 CLINICAL DATA:  Fall EXAM: RIGHT WRIST - 2 VIEW COMPARISON:  None Available. FINDINGS: Degenerative changes at the 1st carpometacarpal joint. No acute bony abnormality. Specifically, no fracture, subluxation, or dislocation. IMPRESSION: No acute bony abnormality. Electronically Signed   By: Charlett Nose M.D.   On: 01/10/2023 23:45   NM HEPATOBILIARY LEAK (POST-SURGICAL)  Result Date: 01/10/2023 CLINICAL DATA:  Weakness, shortness of breath, lower extremity edema. Recent diagnosis pancreatic cancer. EXAM: NUCLEAR MEDICINE HEPATOBILIARY IMAGING TECHNIQUE: Sequential images of the abdomen were obtained out to 60 minutes following intravenous administration of radiopharmaceutical. RADIOPHARMACEUTICALS:  Five mCi Tc-48m  Choletec IV COMPARISON:  Ultrasound and CT exams of 01/09/2023 FINDINGS: There is satisfactory uptake of radiopharmaceutical from the blood pool. Biliary activity visible 24 minutes. Bowel activity visible 34 minutes. Gallbladder activity visible 55 minutes. We followed activity out for  2 hours. There was some progressive filling of the gallbladder  with radiopharmaceutical, and no leak of radiopharmaceutical from the gallbladder or biliary tree is visible. IMPRESSION: 1. Patent cystic duct and patent common bile duct. No leak of radiopharmaceutical from the biliary tree or gallbladder was visible over the 2 hour observation. Electronically Signed   By: Gaylyn Rong M.D.   On: 01/10/2023 15:55    Medications: I have reviewed the patient's current medications.  Assessment/Plan: Pancreas cancer CT abdomen/pelvis 12/03/2022-suspected pancreas mass in the proximal body measuring 2 cm with probable mass effect on the superior aspect of the SMV, multiple hypodense liver lesions, many are new from a remote CT-indeterminate, 2 cystic lesions in the pancreas tail-nonspecific prior bariatric surgery ERCP 12/13/2022-malignant appearing common bile duct stricture, plastic stent placed, brushing cytology-no malignant cells EUS 12/23/2022-30 x 33 mm pancreas head mass with invasion of the SMV, no malignant appearing lymph nodes, 4 cysts in the visualized liver, FNA-adenocarcinoma Biliary obstruction secondary to 1-status post ERCP/stent placement 12/13/2022 Coronary artery disease Atrial fibrillation Gout Sleep apnea Status post bariatric surgery Hypertension Bronchiectasis Gastroesophageal reflux disease History of colon cancer in his 40s Complete heart block-pacemaker placed Admission 01/09/2023 with a fever and failure to thrive, CT chest with evidence of left lung pneumonia, CT abdomen/pelvis with a distended gallbladder and Jaison fat stranding Anemia  Marcus [name] is now at day 6 following cycle 1 of gemcitabine/Abraxane given for treatment of pancreas cancer.  He was admitted on 01/09/2023 with a febrile illness.  It is possible the fever was related to gemcitabine (this is a common toxicity from gemcitabine usually seen within the first 1-2 days following chemotherapy), or he may have pneumonia.  Dyspnea could be related to pneumonia or CHF/COPD.  I  have a low clinical suspicion for a biliary infection or Port-A-Cath infection.  Blood and urine cultures are negative to date.  There is no clinical evidence for progression of the pancreas cancer.  He has developed severe anemia over the past month.  This predated chemotherapy.  He had dark stools while on apixaban.  Apixaban was placed on hold a few weeks ago and the stool color returned to normal.  The anemia had improved prior to this hospital admission.  The hemoglobin is stable.  He has mild thrombocytopenia and leukopenia secondary to chemotherapy.  We will follow-up on the neutrophil count today.  Recommendations: Antibiotics per the medical service Monitor hemoglobin closely while on apixaban, consider dose reduction given his age and bleeding risk Check neutrophil count today. Outpatient follow-up as scheduled on 01/21/2023.  Please call oncology as needed.          LOS: 3 days   Thornton Papas, MD   01/12/2023, 7:30 AM

## 2023-01-12 NOTE — Evaluation (Signed)
Occupational Therapy Evaluation Patient Details Name: Marcus [name] MRN: [id number] DOB: [birth year] Today's Date: 01/12/2023   History of Present Illness Pt presenting with weakness, DOE, BLE edema, chills and diarrhea 01/09/2023. Recently diagnosed with pancreatic cancer; first chemo treat last week. Admitted with sepsis secondary to CAP and concerns for combined CHF exacerbation. US abdomen demonstrating gallbladder sludge with markedly distended gallbladder and multiple echogenic liver lesions concerning for potential hepatic metastasis. CT head negative. PMH significant for prostate cancer s/p radioactive seed implant 2020, colon cancer s/p right colectomy in 1989, COPD, hypertension, paroxysmal afib (not currently on Eliquis since last hospital discharge from 12/15/2022 for EGD and EUS biopsy), arthritis, gout, GERD, OSA, HLD, chronic biliary ductal obstruction s/p stent placement 12/15/2022 due to malignant neoplasm.   Clinical Impression   PTA, pt lived with his son and reports he was mod I. Upon eval, pt presents with decr awareness, safety, balance, strength, and endurance. Pt performing UB ADL with set-up and LB ADL with min guard A. Pt with poor balance throughout session relying on RW and education regarding safe RW use due to new education. Pt will continue to benefit from skilled acute OT services. Pt denying need for follow up despite encouragement.      Recommendations for follow up therapy are one component of a multi-disciplinary discharge planning process, led by the attending physician.  Recommendations may be updated based on patient status, additional functional criteria and insurance authorization.   Assistance Recommended at Discharge Intermittent Supervision/Assistance  Patient can return home with the following A little help with walking and/or transfers;A little help with bathing/dressing/bathroom;Assistance with cooking/housework;Assist for transportation;Help with stairs or  ramp for entrance    Functional Status Assessment  Patient has had a recent decline in their functional status and demonstrates the ability to make significant improvements in function in a reasonable and predictable amount of time.  Equipment Recommendations  Other (comment) (RW)    Recommendations for Other Services       Precautions / Restrictions Precautions Precautions: Fall Restrictions Weight Bearing Restrictions: No      Mobility Bed Mobility               General bed mobility comments: In chair on arrival.    Transfers Overall transfer level: Needs assistance Equipment used: Rolling walker (2 wheels) Transfers: Sit to/from Stand Sit to Stand: Min guard           General transfer comment: for safety; required initial education for safe use of RW      Balance Overall balance assessment: Needs assistance Sitting-balance support: No upper extremity supported, Feet supported Sitting balance-Leahy Scale: Good     Standing balance support: Bilateral upper extremity supported, No upper extremity supported, During functional activity Standing balance-Leahy Scale: Poor Standing balance comment: reliant on RW dynamically and benefits from 1 UE support statically                           ADL either performed or assessed with clinical judgement   ADL Overall ADL's : Needs assistance/impaired Eating/Feeding: Modified independent;Sitting   Grooming: Wash/dry face;Brushing hair;Supervision/safety;Standing (shaving) Grooming Details (indicate cue type and reason): Pt with decr safety awareness when attempting to transfer items over to sink, requiring min guard A but once at sink, supervision for safety with pt observed to frequently support self on sink with LUE Upper Body Bathing: Set up;Sitting   Lower Body Bathing: Min guard;Sit to/from stand   Upper  Body Dressing : Set up;Sitting   Lower Body Dressing: Min guard;Sit to/from stand   Toilet  Transfer: Min guard;Rolling walker (2 wheels);Ambulation Toilet Transfer Details (indicate cue type and reason): Min guard A due to intermittent knee buckling. No overt LOB Toileting- Clothing Manipulation and Hygiene: Set up;Sitting/lateral lean       Functional mobility during ADLs: Min guard;Rolling walker (2 wheels) General ADL Comments: Min cues and education for safe use of RW; of note, this was new learning as pt does not personally have a RW     Vision Baseline Vision/History: 1 Wears glasses (readers) Ability to See in Adequate Light: 0 Adequate Patient Visual Report: No change from baseline Vision Assessment?: Vision impaired- to be further tested in functional context Additional Comments: some decreased contrast sensitivity noted. Difficulty locating button on handle of closet door and using sensation to determine whether guard was on razor     Perception Perception Perception Tested?: No   Praxis Praxis Praxis tested?: Not tested    Pertinent Vitals/Pain Pain Assessment Pain Assessment: Faces Faces Pain Scale: Hurts a little bit Pain Location: generalized Pain Descriptors / Indicators: Discomfort Pain Intervention(s): Limited activity within patient's tolerance, Monitored during session     Hand Dominance Right   Extremity/Trunk Assessment Upper Extremity Assessment Upper Extremity Assessment: Overall WFL for tasks assessed   Lower Extremity Assessment Lower Extremity Assessment: Defer to PT evaluation       Communication Communication Communication: No difficulties   Cognition Arousal/Alertness: Awake/alert Behavior During Therapy: WFL for tasks assessed/performed Overall Cognitive Status: Within Functional Limits for tasks assessed                                 General Comments: Has been managing his garden at home by way of communication with family. Very pleasant and conversational. Insightful questions regarding optimization of  quality of life     General Comments  VSS    Exercises     Shoulder Instructions      Home Living Family/patient expects to be discharged to:: Private residence Living Arrangements: Children Available Help at Discharge: Family;Available 24 hours/day Type of Home: House Home Access: Stairs to enter [employer] of Steps: 3 Entrance Stairs-Rails: Left Home Layout: One level     Bathroom Shower/Tub: Producer, television/film/video: Handicapped height     Home Equipment: Cane - single point;Shower seat - built in;Grab bars - tub/shower;Hand held shower head          Prior Functioning/Environment Prior Level of Function : Independent/Modified Independent             Mobility Comments: cane ADLs Comments: Pt reports independent and driving        OT Problem List: Decreased strength;Decreased activity tolerance;Impaired balance (sitting and/or standing);Decreased safety awareness;Decreased knowledge of use of DME or AE      OT Treatment/Interventions: Self-care/ADL training;Therapeutic exercise;DME and/or AE instruction;Patient/family education;Balance training;Therapeutic activities    OT Goals(Current goals can be found in the care plan section) Acute Rehab OT Goals Patient Stated Goal: go home OT Goal Formulation: With patient Time For Goal Achievement: 01/26/23 Potential to Achieve Goals: Good  OT Frequency: Min 2X/week    Co-evaluation              AM-PAC OT "6 Clicks" Daily Activity     Outcome Measure Help from another person eating meals?: None Help from another person taking care of personal  grooming?: A Little Help from another person toileting, which includes using toliet, bedpan, or urinal?: A Little Help from another person bathing (including washing, rinsing, drying)?: A Little Help from another person to put on and taking off regular upper body clothing?: A Little Help from another person to put on and taking off regular lower  body clothing?: A Little 6 Click Score: 19   End of Session Equipment Utilized During Treatment: Rolling walker (2 wheels) Nurse Communication: Mobility status (Pt in chair without arms on arrival without chair alarm; RN confirmed OK for pt to stay in this chair. Pt able to verbalize need to call for assist when getting up)  Activity Tolerance: Patient tolerated treatment well Patient left: in chair;with call bell/phone within reach  OT Visit Diagnosis: Unsteadiness on feet (R26.81);Muscle weakness (generalized) (M62.81);Other abnormalities of gait and mobility (R26.89)                Time: 0932-1000 OT Time Calculation (min): 28 min Charges:  OT General Charges $OT Visit: 1 Visit OT Evaluation $OT Eval Moderate Complexity: 1 Mod OT Treatments $Self Care/Home Management : 8-22 mins  Tyler Deis, OTR/L [facility] Acute Rehabilitation Office: [phone]'  Myrla Halsted 01/12/2023, 10:19 AM

## 2023-01-12 NOTE — Hospital Course (Addendum)
This is a 82 year old man medical history of prostate cancer s/p radioactive seed implant in 2020, history of colon cancer status post right colectomy in 1989, COPD, hypertension, paroxysmal atrial fibrillation (not currently on Eliquis since last hospital discharge from 12/15/2022 for EGD and EUS biopsy), arthritis, gout, GERD, OSA, hyperlipidemia, chronic biliary ductal obstruction s/p stent placement 12/15/2022 due to malignant neoplasm, , recently diagnosed with pancreatic cancer 2 weeks ago and received first session of chemotherapy on 01/07/2023 presented with emergency department complaining of generalized weakness, lower extremity edema, and exertional shortness of breath.  Patient was admitted for sepsis secondary to community-acquired pneumonia as well as concerns for combined CHF exacerbation.  GI consulted for ERCP and bile duct exchange, but they had ordered HIDA scan 7/8 with no concerning findings and therefore did not require any procedures.  Diet has been resumed as well as Eliquis and heparin drip discontinued.  He continues to have some urethral pain after Foley insertion and has concerns for UTI as well as some mild Foley trauma.  He has been started on Pyridium for his discomfort.   His respiratory status improved significantly and repeat chest x-ray done.  He did not desaturate and did not require supplemental oxygen.  PT OT recommending home health.  He is medically stable for discharge at this time will need to follow-up with PCP, cardiology and his medical oncologist in outpatient setting.  Assessment and Plan:  Sepsis secondary from pneumonia Community-acquired pneumonia (patient is immunocompromised and recent hospital admission concern for pneumonia associated with Staph aureus and pseudomonas), improved -Patient comes in with complaining of fever, chills, shortness of breath, sputum production over the course of last 3 days. -Initial presentation patient found febrile 102.9 degrees  Fahrenheit, pressure 129/49 and heart rate 84.  WBC 5.  As patient has active cancer currently on chemotherapy leukocytosis is not evident.  Lactic acid 1.5 WNL. -Chest x-ray showed evidence of COPD. -CTA chest showed small bilateral pleural effusion, acute appearing heterogenous airspace opacity of the superior segment of the left lobe consistent with infection/aspiration. -Given patient is immunocompromised and recent hospital admission 3 weeks ago there is concern for stay for low and Pseudomonas associated pneumonia versus complete tach or pneumonia as well. -Chemo-Port site looks clean no concern for infection. -UA unremarkable. -Sepsis secondary from in the setting of pneumonia and acute cholecystitis. -In the ED patient was getting IV fluid LR 150 cc/h per sepsis protocol however physical exam showed evidence of lower extremity pitting edema 2+ and echo showed reduced EF less than 45% there is concern for CHF exacerbation as well.  So I have to stop the fluid. -Continue broad-spectrum antibiotic vancomycin and cefepime per pharmacy protocol.  Continue azithromycin for atypical coverage.  Anticipate will be able to discharge on oral antibiotics with Augmentin -Urine strep pneumonia negative and Legionella pending -Appears to be back on room air will and he did not desaturate. -Repeat CXR done prior to D/C showed "Cardiomegaly with prominent bilateral interstitial opacities which could represent pulmonary venous congestion or bronchovascular crowding in the setting of low lung volumes. Hazy bibasilar airspace opacities likely represent atelectasis, but infection is not excluded." -Follow-up with PCP and repeat chest x-ray in 3 to 6 weeks.    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  He is also endorsing orthopnea, dyspnea and PND.   Physical exam showed bilateral lower extremity  pitting edema 2+ -Per chart review echo from 12/14/2022 reduced EF 45 to 50% and grade 1 diastolic dysfunction. -As mentioned above patient was receiving IV fluid LR 150 cc/h for sepsis protocol however as physical exam and history evidence for CHF exacerbation -Blood pressure remains soft. No intake or output data in the 24 hours ending 01/15/23 2001 -Increase IV diuresis to 40 mg twice daily and monitor and is diuresis now changed to p.o. 40 daily -carefully monitor output for goal output of 2-3 L daily -BNP at the time of discharge was 114.0 -Decreased fluid intake to 1500 mL daily -No need for repeat 2D echocardiogram -With cardiology in outpatient setting   Fall -Patient fell overnight on 7/8 with no noted injuries on imaging -Currently on fall precautions -PT/OT evaluation ordered and recommending home health   History of hypertension -At home patient is on amlodipine 10 mg daily.  Currently blood pressure is borderline soft in the setting of sepsis.  Also he has CHF exacerbation at the same time. -Continuing Lasix 40 mg mg IV twice daily for CHF exacerbation.      COPD exacerbation -Patient reported history of COPD but not any treatment at home? -Chest x-ray showed hypertension for short of the lung suggested COPD. -Patient reporting worsening shortness of breath and mucoid sputum production.  It can be COPD or CHF exacerbation or combined both. Also pneumonia can exacerbate the COPD as well. -Starting DuoNeb every 6 hours scheduled and Xopenex as needed. -In the setting of pneumonia and acute cholecystitis holding prednisone as it can exacerbate acute inflammatory state. -Starting azithromycin 500 mg daily for 5 days to control inflammation for COPD exacerbation and also covering atypical for CAP we will continue antibiotics at discharge -Continue supplemental oxygen with O2 sat above 92% -Continue supplemental oxygen, incentive  spirometry, flutter valve, and chest physiotherapy -Repeat chest x-ray in the a.m. he will need an amatory home O2 screen prior to discharge and he did not desaturate -Will need outpatient follow-up and repeat chest x-ray in 3 to 6 weeks   Acute cholecystitis, resolved and ruled out Biliary stenosis status post stent placement -CT abdomen pelvis showed Distended gallbladder with mild gallbladder wall thickening and adjacent pericholecystic fat stranding. Sludge in the gallbladder fundus. Findings are concerning for acute cholecystitis.   -Ultrasound similar finding. -Patient had new diagnosis of pancreatic head adenocarcinoma as mentioned above and recent hospital admission showed biliary duct stenosis status post stent placement on 12/15/2022. -Consulted with GI and they evaluated and recommended no repeating ERCP at this time and recommends continued follow-up in outpatient setting for follow-up ERCP and stent exchange on 02/07/2023 Dr. 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  Started on chemotherapy first dose on 12/27/2022. -Appreciate Oncology Evaluation commending outpatient follow-up on 01/21/2023 -On IV antibiotics with cefepime and azithromycin for above   Elevated Troponin -High sensitive troponin 19 slight elevated.  Patient denies any chest pain.  EKG showed sinus rhythm with premature atrial complex and nonspecific T wave  abnormality.  No concern for ACS at this time. -Continue to trend troponin. -Continue Monitoring on Telemetry. -Patient had an echo 1 month ago no need to repeat it this visit    Chronic Atrial Fibrillation Patient's CHA2DS2-VASc score is 3. -Per chart review when patient was last discharged from the hospital on 12/22/2022 plan was to hold to Eliquis until ERCP then resume it however patient reported that he never resumed his Eliquis as his oncologist recommended to hold it given he is going to have chemotherapy and may develop pancytopenia. -Dr. Sherryll Burger did reach out to on-call cardiology Dr.Tannu, given he has CHA2DS2-VASc score is 3 he supposed to be on anticoagulation. -Appreciate oncology recommendations regarding need for continuation of Eliquis and recommends changing monitor hemoglobin closely while on apixaban and consider a dose reduction in his age and dizziness and this can be done in outpatient setting with his PCP and cardiology discussion   Hypokalemia -Patient's K+ Level Trend: Recent Labs  Lab 12/29/22 1028 01/07/23 1159 01/09/23 1635 01/10/23 0144 01/11/23 0306 01/12/23 0500 01/13/23 0345  K 4.0 3.6 3.3* 3.2* 3.1* 3.1* 3.3*  -Replete with po Potassium Chloride 40 mEQ BID -Continue to Monitor and Replete as Necessary -Repeat CMP within 1 week  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 -BUN/Cr Trend: Recent Labs  Lab 12/29/22 1028 01/07/23 1159 01/09/23 1635 01/10/23 0144 01/11/23 0306 01/12/23 0500 01/13/23 0345  BUN 22 21 17 20 20 21 21   CREATININE 1.00 0.85 1.10 1.15 1.17 1.23 1.30*  -He is initially on IV fluids for sepsis management but at the same time he had CHF exacerbation as well as IV fluids had been held and is getting IV diuretics -Continue to monitor urine output and strict I's and O's and Daily Weights No intake or output data  in the 24 hours ending 01/15/23 2001  -Avoid Nephrotoxic Medications, Contrast Dyes, Hypotension and Dehydration to Ensure Adequate Renal Perfusion and will need to Renally Adjust Meds -Continue to Monitor and Trend Renal Function carefully and repeat CMP within 1 week  History of Gout -Continue Allopurinol 300 mg daily   BPH -Patient reporting that every time he wants to go to bathroom to pee only few drops of urine comes out.  He has significant urinary urgency and hesitancy. -Resumed home Tamsulosin 0.4 mg daily -Removed Foley due to dysuria   GERD/GI prophylaxis -Continue with Pantoprazole 40 mg p.o. daily   Dysuria with concerns for UTI and mild Foley trauma -Continue Pyridium for ongoing pain -Continued current antibiotics IV cefepime and oral azithromycin while hospitalized -Urinalysis done and showed a hazy appearance with amber color urine, moderate hemoglobin, moderate leukocytes, positive nitrites, few bacteria, present mucus, greater than 50 RBCs per high-power field, greater than 50 WBCs -Urine cultures showing no growth and unclear if he is already on antibiotics when it was obtained -Regardless his antibiotics being changed to Augmentin for discharge for 3 more days  Pancytopenia -CBC Trend: Recent Labs  Lab 12/29/22 1028 01/07/23 1159 01/09/23 1635 01/10/23 0144 01/11/23 0306 01/12/23 0500 01/13/23 0345  WBC 5.7 4.7 5.0 3.9* 2.8* 2.1*  2.0* 1.8*  HGB 8.7* 9.0* 8.9* 8.1* 7.5* 8.1*  8.1* 7.8*  HCT 26.7* 27.7* 28.1* 24.9* 23.3* 25.3*  25.5* 23.9*  MCV 93.7 93.3 93.7 92.6 92.8 93.0  93.1 91.6  PLT 266 210 160 131* 126* 125*  128* 117*  -In the  setting of recent Chemotherapy -Follow With his primary oncologist in outpatient setting with Dr. Truett Perna  Hypoalbuminemia -Patient's Albumin Trend: Recent Labs  Lab 12/23/22 1235 12/29/22 1028 01/07/23 1159 01/09/23 1635 01/10/23 0144 01/11/23 0306 01/13/23 0345  ALBUMIN 2.7* 3.2* 3.5 2.6* 2.2* 2.1* 2.1*   -Continue to Monitor and Trend and repeat CMP in the AM  Obesity -Complicates overall prognosis and care -Estimated body mass index is 33.02 kg/m as calculated from the following:   Height as of this encounter: 5\' 8"  (1.727 m).   Weight as of this encounter: 98.5 kg.  -Weight Loss and Dietary Counseling given

## 2023-01-13 ENCOUNTER — Inpatient Hospital Stay (HOSPITAL_COMMUNITY): Payer: No Typology Code available for payment source

## 2023-01-13 ENCOUNTER — Other Ambulatory Visit: Payer: Self-pay | Admitting: *Deleted

## 2023-01-13 DIAGNOSIS — J189 Pneumonia, unspecified organism: Secondary | ICD-10-CM | POA: Diagnosis not present

## 2023-01-13 DIAGNOSIS — C250 Malignant neoplasm of head of pancreas: Secondary | ICD-10-CM

## 2023-01-13 DIAGNOSIS — A419 Sepsis, unspecified organism: Secondary | ICD-10-CM | POA: Diagnosis not present

## 2023-01-13 DIAGNOSIS — I482 Chronic atrial fibrillation, unspecified: Secondary | ICD-10-CM | POA: Diagnosis not present

## 2023-01-13 DIAGNOSIS — I5043 Acute on chronic combined systolic (congestive) and diastolic (congestive) heart failure: Secondary | ICD-10-CM | POA: Diagnosis not present

## 2023-01-13 LAB — CBC WITH DIFFERENTIAL/PLATELET
Abs Immature Granulocytes: 0 10*3/uL (ref 0.00–0.07)
Basophils Absolute: 0 10*3/uL (ref 0.0–0.1)
Basophils Relative: 2 %
Eosinophils Absolute: 0.1 10*3/uL (ref 0.0–0.5)
Eosinophils Relative: 7 %
HCT: 23.9 % — ABNORMAL LOW (ref 39.0–52.0)
Hemoglobin: 7.8 g/dL — ABNORMAL LOW (ref 13.0–17.0)
Lymphocytes Relative: 33 %
Lymphs Abs: 0.6 10*3/uL — ABNORMAL LOW (ref 0.7–4.0)
MCH: 29.9 pg (ref 26.0–34.0)
MCHC: 32.6 g/dL (ref 30.0–36.0)
MCV: 91.6 fL (ref 80.0–100.0)
Monocytes Absolute: 0.2 10*3/uL (ref 0.1–1.0)
Monocytes Relative: 9 %
Neutro Abs: 0.9 10*3/uL — ABNORMAL LOW (ref 1.7–7.7)
Neutrophils Relative %: 49 %
Platelets: 117 10*3/uL — ABNORMAL LOW (ref 150–400)
RBC: 2.61 MIL/uL — ABNORMAL LOW (ref 4.22–5.81)
RDW: 14.9 % (ref 11.5–15.5)
WBC: 1.8 10*3/uL — ABNORMAL LOW (ref 4.0–10.5)
nRBC: 0 % (ref 0.0–0.2)
nRBC: 0 /100 WBC

## 2023-01-13 LAB — COMPREHENSIVE METABOLIC PANEL
ALT: 28 U/L (ref 0–44)
AST: 34 U/L (ref 15–41)
Albumin: 2.1 g/dL — ABNORMAL LOW (ref 3.5–5.0)
Alkaline Phosphatase: 100 U/L (ref 38–126)
Anion gap: 9 (ref 5–15)
BUN: 21 mg/dL (ref 8–23)
CO2: 26 mmol/L (ref 22–32)
Calcium: 8.1 mg/dL — ABNORMAL LOW (ref 8.9–10.3)
Chloride: 105 mmol/L (ref 98–111)
Creatinine, Ser: 1.3 mg/dL — ABNORMAL HIGH (ref 0.61–1.24)
GFR, Estimated: 55 mL/min — ABNORMAL LOW (ref >60)
Glucose, Bld: 121 mg/dL — ABNORMAL HIGH (ref 70–99)
Potassium: 3.3 mmol/L — ABNORMAL LOW (ref 3.5–5.1)
Sodium: 140 mmol/L (ref 135–145)
Total Bilirubin: 0.9 mg/dL (ref 0.3–1.2)
Total Protein: 5.1 g/dL — ABNORMAL LOW (ref 6.5–8.1)

## 2023-01-13 LAB — BRAIN NATRIURETIC PEPTIDE: B Natriuretic Peptide: 114 pg/mL — ABNORMAL HIGH (ref 0.0–100.0)

## 2023-01-13 LAB — MAGNESIUM: Magnesium: 2 mg/dL (ref 1.7–2.4)

## 2023-01-13 LAB — RETICULOCYTES
Immature Retic Fract: 17 % — ABNORMAL HIGH (ref 2.3–15.9)
RBC.: 2.6 MIL/uL — ABNORMAL LOW (ref 4.22–5.81)
Retic Ct Pct: <0.4 % — ABNORMAL LOW (ref 0.4–3.1)

## 2023-01-13 LAB — IRON AND TIBC
Iron: 17 ug/dL — ABNORMAL LOW (ref 45–182)
Saturation Ratios: 10 % — ABNORMAL LOW (ref 17.9–39.5)
TIBC: 165 ug/dL — ABNORMAL LOW (ref 250–450)
UIBC: 148 ug/dL

## 2023-01-13 LAB — VITAMIN B12: Vitamin B-12: 794 pg/mL (ref 180–914)

## 2023-01-13 LAB — FERRITIN: Ferritin: 291 ng/mL (ref 24–336)

## 2023-01-13 LAB — PHOSPHORUS: Phosphorus: 3 mg/dL (ref 2.5–4.6)

## 2023-01-13 LAB — PATHOLOGIST SMEAR REVIEW

## 2023-01-13 LAB — FOLATE: Folate: 23.6 ng/mL (ref >5.9)

## 2023-01-13 MED ORDER — FUROSEMIDE 40 MG PO TABS
40.0000 mg | ORAL_TABLET | Freq: Every day | ORAL | Status: DC
  Administered 2023-01-13: 40 mg via ORAL
  Filled 2023-01-13: qty 1

## 2023-01-13 MED ORDER — POTASSIUM CHLORIDE CRYS ER 20 MEQ PO TBCR: 20.0000 meq | EXTENDED_RELEASE_TABLET | Freq: Every day | ORAL | 0 refills | Status: DC

## 2023-01-13 MED ORDER — AMOXICILLIN-POT CLAVULANATE 875-125 MG PO TABS: 1.0000 | ORAL_TABLET | Freq: Two times a day (BID) | ORAL | 0 refills | Status: AC

## 2023-01-13 MED ORDER — HEPARIN SOD (PORK) LOCK FLUSH 100 UNIT/ML IV SOLN
500.0000 [IU] | INTRAVENOUS | Status: AC | PRN
  Administered 2023-01-13: 500 [IU]

## 2023-01-13 MED ORDER — GUAIFENESIN ER 600 MG PO TB12: 600.0000 mg | ORAL_TABLET | Freq: Two times a day (BID) | ORAL | 0 refills | Status: AC

## 2023-01-13 MED ORDER — POTASSIUM CHLORIDE CRYS ER 20 MEQ PO TBCR
40.0000 meq | EXTENDED_RELEASE_TABLET | Freq: Two times a day (BID) | ORAL | Status: DC
  Filled 2023-01-13: qty 2

## 2023-01-13 MED ORDER — AMOXICILLIN-POT CLAVULANATE 875-125 MG PO TABS
1.0000 | ORAL_TABLET | Freq: Two times a day (BID) | ORAL | Status: DC
  Administered 2023-01-13: 1 via ORAL
  Filled 2023-01-13 (×2): qty 1

## 2023-01-13 MED ORDER — POTASSIUM CHLORIDE 20 MEQ PO PACK
40.0000 meq | PACK | Freq: Once | ORAL | Status: AC
  Administered 2023-01-13: 40 meq via ORAL
  Filled 2023-01-13: qty 2

## 2023-01-13 MED ORDER — GUAIFENESIN ER 600 MG PO TB12
1200.0000 mg | ORAL_TABLET | Freq: Two times a day (BID) | ORAL | Status: DC
  Filled 2023-01-13: qty 2

## 2023-01-13 MED ORDER — FUROSEMIDE 40 MG PO TABS: 40.0000 mg | ORAL_TABLET | Freq: Every day | ORAL | 0 refills | Status: DC

## 2023-01-13 MED ORDER — ACETAMINOPHEN 325 MG PO TABS: 650.0000 mg | ORAL_TABLET | Freq: Four times a day (QID) | ORAL | 0 refills | Status: DC | PRN

## 2023-01-13 NOTE — Progress Notes (Signed)
AVS given and went over with patient. IV team deaccessed port. PIV removed. No questions at this time. All belongings returned to patient.

## 2023-01-13 NOTE — TOC Transition Note (Signed)
Transition of Care [facility]) - CM/SW Discharge Note   Patient Details  Name: Marcus [name] MRN: [id number] Date of Birth: 1940-10-04  [facility] CM/SW Contact:  Marcus [name], Marcus Coria, RN Phone Number: 01/13/2023, 1:32 PM   Clinical Narrative:     Patient is scheduled for discharge today.  Readmission Risk Assessment done. Home health referral called in to [facility] per patient's request, [facility] voiced acceptance, info on AVS.  Skilled Services form emailed to [email or web address]. Outpatient f/u, hospital f/u and discharge instructions on AVS. BSC and RW ordered from [employer] and [employer] to deliver to patient at bedside prior to discharge.  Son, Marcus [name] to transport at discharge.  No further TOC needs noted.       Final next level of care: Home w Home Health Services Barriers to Discharge: Barriers Resolved   Patient Goals and CMS Choice CMS Medicare.gov Compare Post Acute Care list provided to:: Patient Choice offered to / list presented to : NA  Discharge Placement                  Patient to be transferred to facility by: Son Name of family member notified: Marcus [name]    Discharge Plan and Services Additional resources added to the After Visit Summary for     Discharge Planning Services: CM Consult Post Acute Care Choice: NA          DME Arranged: Bedside commode, Walker rolling DME Agency: [employer] Date DME Agency Contacted: 01/13/23 Time DME Agency Contacted: 1300 Representative spoke with at DME Agency: Marcus [name] HH Arranged: PT, RN, Disease Management HH Agency: [facility] Date [facility] Agency Contacted: 01/13/23 Time HH Agency Contacted: 1305 Representative spoke with at [facility] Agency: Marcus [name]  Social Determinants of Health (SDOH) Interventions SDOH Screenings   Food Insecurity: No Food Insecurity (01/10/2023)  Housing: Low Risk  (01/10/2023)  Transportation Needs: No Transportation Needs (01/10/2023)  Utilities: Not At Risk  (01/10/2023)  Alcohol Screen: Low Risk  (08/04/2020)  Depression (PHQ2-9): High Risk (12/20/2022)  Financial Resource Strain: Low Risk  (07/28/2021)  Physical Activity: Inactive (07/28/2021)  Social Connections: Moderately Isolated (08/04/2020)  Stress: No Stress Concern Present (07/28/2021)  Tobacco Use: Medium Risk (01/10/2023)     Readmission Risk Interventions    01/11/2023    5:08 PM  Readmission Risk Prevention Plan  Transportation Screening Complete  Medication Review (RN Care Manager) Referral to Pharmacy  PCP or Specialist appointment within 3-5 days of discharge Complete  HRI or Home Care Consult Complete  SW Recovery Care/Counseling Consult Complete  Palliative Care Screening Not Applicable  Skilled Nursing Facility Not Applicable

## 2023-01-13 NOTE — Progress Notes (Addendum)
Mobility Specialist Progress Note:    01/13/23 1215  Mobility  Activity Ambulated with assistance in hallway  Level of Assistance Minimal assist, patient does 75% or more  Assistive Device Front wheel walker  Distance Ambulated (ft) 250 ft  Activity Response Tolerated well  Mobility Referral Yes  $Mobility charge 1 Mobility  Mobility Specialist Start Time (ACUTE ONLY) 1202  Mobility Specialist Stop Time (ACUTE ONLY) 1215  Mobility Specialist Time Calculation (min) (ACUTE ONLY) 13 min   Pt received in chair, agreeable to ambulate. Pt needed MinA w/ STS and contact guard during ambulation. Pt demonstrates a trendelenburg pattern with a R lateral lean, but had no c/o throughout session. Pt assisted back to chair w/ call bell in hand and NT in room. All needs met.   Thompson Grayer Mobility Specialist  Please contact vis Secure Chat or  Rehab Office [phone]

## 2023-01-13 NOTE — Discharge Summary (Signed)
Physician Discharge Summary   Patient: Marcus [name] MRN: [id number] DOB: 04-15-1941  Admit date:     01/09/2023  Discharge date: 01/13/2023  Discharge Physician: Marguerita Merles, DO   PCP: [name], NP   Recommendations at discharge:   Follow-up with PCP within 1 to 2 weeks and repeat CBC, CMP, mag, Phos within 1 week Follow-up with medical oncology Dr. Truett Perna within 1 to 2 weeks With cardiology in outpatient setting repeat chest x-ray in 3 to 6 weeks  Discharge Diagnoses: Principal Problem:   Sepsis secondary from pneumonia and acute cholecystitis  (HCC) Active Problems:   Pneumonia of left lower lobe due to infectious organism   CHF exacerbation (HCC)   Combined systolic and diastolic heart failure (HCC)   COPD with acute exacerbation (HCC)   Acute cholecystitis   Gout   Atrial fibrillation, chronic (HCC)   Pancreatic adenocarcinoma (HCC)   Hypokalemia   History of biliary duct stent placement   Pruritus   AKI (acute kidney injury) (HCC)  Resolved Problems:   * No resolved hospital problems. [facility] Course: This is a 82 year old man medical history of prostate cancer s/p radioactive seed implant in 2020, history of colon cancer status post right colectomy in 1989, COPD, hypertension, paroxysmal atrial fibrillation (not currently on Eliquis since last hospital discharge from 12/15/2022 for EGD and EUS biopsy), arthritis, gout, GERD, OSA, hyperlipidemia, chronic biliary ductal obstruction s/p stent placement 12/15/2022 due to malignant neoplasm, , recently diagnosed with pancreatic cancer 2 weeks ago and received first session of chemotherapy on 01/07/2023 presented with emergency department complaining of generalized weakness, lower extremity edema, and exertional shortness of breath.  Patient was admitted for sepsis secondary to community-acquired pneumonia as well as concerns for combined CHF exacerbation.  GI consulted for ERCP and bile duct exchange, but they had ordered  HIDA scan 7/8 with no concerning findings and therefore did not require any procedures.  Diet has been resumed as well as Eliquis and heparin drip discontinued.  He continues to have some urethral pain after Foley insertion and has concerns for UTI as well as some mild Foley trauma.  He has been started on Pyridium for his discomfort.   His respiratory status improved significantly and repeat chest x-ray done.  He did not desaturate and did not require supplemental oxygen.  PT OT recommending home health.  He is medically stable for discharge at this time will need to follow-up with PCP, cardiology and his medical oncologist in outpatient setting.  Assessment and Plan:  Sepsis secondary from pneumonia Community-acquired pneumonia (patient is immunocompromised and recent hospital admission concern for pneumonia associated with Staph aureus and pseudomonas), improved -Patient comes in with complaining of fever, chills, shortness of breath, sputum production over the course of last 3 days. -Initial presentation patient found febrile 102.9 degrees Fahrenheit, pressure 129/49 and heart rate 84.  WBC 5.  As patient has active cancer currently on chemotherapy leukocytosis is not evident.  Lactic acid 1.5 WNL. -Chest x-ray showed evidence of COPD. -CTA chest showed small bilateral pleural effusion, acute appearing heterogenous airspace opacity of the superior segment of the left lobe consistent with infection/aspiration. -Given patient is immunocompromised and recent hospital admission 3 weeks ago there is concern for stay for low and Pseudomonas associated pneumonia versus complete tach or pneumonia as well. -Chemo-Port site looks clean no concern for infection. -UA unremarkable. -Sepsis secondary from in the setting of pneumonia and acute cholecystitis. -In the ED patient was getting IV fluid LR  150 cc/h per sepsis protocol however physical exam showed evidence of lower extremity pitting edema 2+ and echo  showed reduced EF less than 45% there is concern for CHF exacerbation as well.  So I have to stop the fluid. -Continue broad-spectrum antibiotic vancomycin and cefepime per pharmacy protocol.  Continue azithromycin for atypical coverage.  Anticipate will be able to discharge on oral antibiotics with Augmentin -Urine strep pneumonia negative and Legionella pending -Appears to be back on room air will and he did not desaturate. -Repeat CXR done prior to D/C showed "Cardiomegaly with prominent bilateral interstitial opacities which could represent pulmonary venous congestion or bronchovascular crowding in the setting of low lung volumes. Hazy bibasilar airspace opacities likely represent atelectasis, but infection is not excluded." -Follow-up with PCP and repeat chest x-ray in 3 to 6 weeks.    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  He is also endorsing orthopnea, dyspnea and PND.  Physical exam showed bilateral lower extremity pitting edema 2+ -Per chart review echo from 12/14/2022 reduced EF 45 to 50% and grade 1 diastolic dysfunction. -As mentioned above patient was receiving IV fluid LR 150 cc/h for sepsis protocol however as physical exam and history evidence for CHF exacerbation -Blood pressure remains soft. No intake or output data in the 24 hours ending 01/15/23 2001 -Increase IV diuresis to 40 mg twice daily and monitor and is diuresis now changed to p.o. 40 daily -carefully monitor output for goal output of 2-3 L daily -BNP at the time of discharge was 114.0 -Decreased fluid intake to 1500 mL daily -No need for repeat 2D echocardiogram -With cardiology in outpatient setting   Fall -Patient fell overnight on 7/8 with no noted injuries on imaging -Currently on fall precautions -PT/OT evaluation ordered and recommending home  health   History of hypertension -At home patient is on amlodipine 10 mg daily.  Currently blood pressure is borderline soft in the setting of sepsis.  Also he has CHF exacerbation at the same time. -Continuing Lasix 40 mg mg IV twice daily for CHF exacerbation.      COPD exacerbation -Patient reported history of COPD but not any treatment at home? -Chest x-ray showed hypertension for short of the lung suggested COPD. -Patient reporting worsening shortness of breath and mucoid sputum production.  It can be COPD or CHF exacerbation or combined both. Also pneumonia can exacerbate the COPD as well. -Starting DuoNeb every 6 hours scheduled and Xopenex as needed. -In the setting of pneumonia and acute cholecystitis holding prednisone as it can exacerbate acute inflammatory state. -Starting azithromycin 500 mg daily for 5 days to control inflammation for COPD exacerbation and also covering atypical for CAP we will continue antibiotics at discharge -Continue supplemental oxygen with O2 sat above 92% -Continue supplemental oxygen, incentive spirometry, flutter valve, and chest physiotherapy -Repeat chest x-ray in the a.m. he will need an amatory home O2 screen prior to discharge and he did not desaturate -Will need outpatient follow-up and repeat chest x-ray in 3 to 6 weeks   Acute cholecystitis, resolved and ruled out Biliary stenosis status post stent placement -CT abdomen pelvis showed Distended gallbladder with mild gallbladder wall thickening and adjacent pericholecystic fat stranding. Sludge in the gallbladder fundus. Findings are concerning for acute cholecystitis.   -Ultrasound similar finding. -Patient had new diagnosis of pancreatic head adenocarcinoma as mentioned above and recent hospital admission  showed biliary duct stenosis status post stent placement on 12/15/2022. -Consulted with GI and they evaluated and recommended no repeating ERCP at this time and recommends continued follow-up  in outpatient setting for follow-up ERCP and stent exchange on 02/07/2023 Dr. 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  Started on chemotherapy first dose on 12/27/2022. -Appreciate Oncology Evaluation commending outpatient follow-up on 01/21/2023 -On IV antibiotics with cefepime and azithromycin for above   Elevated Troponin -High sensitive troponin 19 slight elevated.  Patient denies any chest pain.  EKG showed sinus rhythm with premature atrial complex and nonspecific T wave abnormality.  No concern for ACS at this time. -Continue to trend troponin. -Continue Monitoring on Telemetry. -Patient had an echo 1 month ago no need to repeat it this visit    Chronic Atrial Fibrillation Patient's CHA2DS2-VASc score is 3. -Per chart review when patient was last discharged from the hospital on 12/22/2022 plan was to hold to Eliquis until ERCP then resume it however patient reported that he never resumed his Eliquis as his oncologist recommended to hold it given he is going to have chemotherapy and may develop pancytopenia. -Dr. Sherryll Burger did reach out to on-call cardiology Dr.Tannu, given he has CHA2DS2-VASc score is 3 he supposed to be on anticoagulation. -Appreciate oncology recommendations regarding need for continuation of Eliquis and recommends changing monitor hemoglobin closely while on apixaban and consider a dose reduction in his age and  dizziness and this can be done in outpatient setting with his PCP and cardiology discussion   Hypokalemia -Patient's K+ Level Trend: Recent Labs  Lab 12/29/22 1028 01/07/23 1159 01/09/23 1635 01/10/23 0144 01/11/23 0306 01/12/23 0500 01/13/23 0345  K 4.0 3.6 3.3* 3.2* 3.1* 3.1* 3.3*  -Replete with po Potassium Chloride 40 mEQ BID -Continue to Monitor and Replete as Necessary -Repeat CMP within 1 week  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 -BUN/Cr Trend: Recent Labs  Lab 12/29/22 1028 01/07/23 1159 01/09/23 1635 01/10/23 0144 01/11/23 0306 01/12/23 0500 01/13/23 0345  BUN 22 21 17 20 20 21 21   CREATININE 1.00 0.85 1.10 1.15 1.17 1.23 1.30*  -He is initially on IV fluids for sepsis management but at the same time he had CHF exacerbation as well as IV fluids had been held and is getting IV diuretics -Continue to monitor urine output and strict I's and O's and Daily Weights No intake or output data in the 24 hours ending 01/15/23 2001  -Avoid Nephrotoxic Medications, Contrast Dyes, Hypotension and Dehydration to Ensure Adequate Renal Perfusion and will need to Renally Adjust Meds -Continue to Monitor and Trend Renal Function carefully and repeat CMP within 1 week  History of Gout -Continue Allopurinol 300 mg daily   BPH -Patient reporting that every time he wants to go to bathroom to pee only few drops of urine comes out.  He has significant urinary urgency and hesitancy. -Resumed  home Tamsulosin 0.4 mg daily -Removed Foley due to dysuria   GERD/GI prophylaxis -Continue with Pantoprazole 40 mg p.o. daily   Dysuria with concerns for UTI and mild Foley trauma -Continue Pyridium for ongoing pain -Continued current antibiotics IV cefepime and oral azithromycin while hospitalized -Urinalysis done and showed a hazy appearance with amber color urine,  moderate hemoglobin, moderate leukocytes, positive nitrites, few bacteria, present mucus, greater than 50 RBCs per high-power field, greater than 50 WBCs -Urine cultures showing no growth and unclear if he is already on antibiotics when it was obtained -Regardless his antibiotics being changed to Augmentin for discharge for 3 more days  Pancytopenia -CBC Trend: Recent Labs  Lab 12/29/22 1028 01/07/23 1159 01/09/23 1635 01/10/23 0144 01/11/23 0306 01/12/23 0500 01/13/23 0345  WBC 5.7 4.7 5.0 3.9* 2.8* 2.1*  2.0* 1.8*  HGB 8.7* 9.0* 8.9* 8.1* 7.5* 8.1*  8.1* 7.8*  HCT 26.7* 27.7* 28.1* 24.9* 23.3* 25.3*  25.5* 23.9*  MCV 93.7 93.3 93.7 92.6 92.8 93.0  93.1 91.6  PLT 266 210 160 131* 126* 125*  128* 117*  -In the setting of recent Chemotherapy -Follow With his primary oncologist in outpatient setting with Dr. Truett Perna  Hypoalbuminemia -Patient's Albumin Trend: Recent Labs  Lab 12/23/22 1235 12/29/22 1028 01/07/23 1159 01/09/23 1635 01/10/23 0144 01/11/23 0306 01/13/23 0345  ALBUMIN 2.7* 3.2* 3.5 2.6* 2.2* 2.1* 2.1*  -Continue to Monitor and Trend and repeat CMP in the AM  Obesity -Complicates overall prognosis and care -Estimated body mass index is 33.02 kg/m as calculated from the following:   Height as of this encounter: 5\' 8"  (1.727 m).   Weight as of this encounter: 98.5 kg.  -Weight Loss and Dietary Counseling given  Consultants: Medical oncology, gastroenterology Procedures performed: As delineated as above  Disposition: Home health Diet recommendation:  Discharge Diet Orders (From admission, onward)     Start     Ordered   01/13/23 0000  Diet - low sodium heart healthy        01/13/23 1227           Cardiac diet DISCHARGE MEDICATION: Allergies as of 01/13/2023       Reactions   Contrast Media [iodinated Contrast Media] Palpitations   TACHYCARDIA- patient states he has tolerated newer agents since this reaction >30 yrs ago         Medication List     STOP taking these medications    prochlorperazine 10 MG tablet Commonly known as: COMPAZINE       TAKE these medications    acetaminophen 325 MG tablet Commonly known as: TYLENOL Take 2 tablets (650 mg total) by mouth every 6 (six) hours as needed for mild pain (or Fever >/= 101).   allopurinol 300 MG tablet Commonly known as: ZYLOPRIM Take 1 tablet (300 mg total) by mouth daily.   amLODipine 10 MG tablet Commonly known as: NORVASC Take 1 tablet (10 mg total) by mouth daily.   amoxicillin-clavulanate 875-125 MG tablet Commonly known as: AUGMENTIN Take 1 tablet by mouth every 12 (twelve) hours for 3 days.   apixaban 5 MG Tabs tablet Commonly known as: ELIQUIS Take 1 tablet (5 mg total) by mouth 2 (two) times daily.   CALCIUM 600 + D PO Take 1 tablet by mouth daily. 800 units of vitamin D   esomeprazole 40 MG capsule Commonly known as: NexIUM Take 1 capsule (40 mg total) by mouth 2 (two) times daily before a meal.   Flutter Devi 1 each  by Does not apply route in the morning and at bedtime.   furosemide 40 MG tablet Commonly known as: LASIX Take 1 tablet (40 mg total) by mouth daily.   guaiFENesin 600 MG 12 hr tablet Commonly known as: MUCINEX Take 1 tablet (600 mg total) by mouth 2 (two) times daily for 5 days.   hydrOXYzine 50 MG tablet Commonly known as: ATARAX Take 1 tablet (50 mg total) by mouth 3 (three) times daily as needed. What changed: reasons to take this   lidocaine-prilocaine cream Commonly known as: EMLA Apply 1 Application topically as needed.   multivitamin tablet Take 1 tablet by mouth in the morning.   ondansetron 8 MG tablet Commonly known as: ZOFRAN Take 1 tablet (8 mg total) by mouth every 8 (eight) hours as needed.   polyethylene glycol powder 17 GM/SCOOP powder Commonly known as: GLYCOLAX/MIRALAX Take 17g (1 capful) by mouth as directed daily as needed for mild constipation.   potassium chloride SA 20  MEQ tablet Commonly known as: KLOR-CON M Take 1 tablet (20 mEq total) by mouth daily.   sodium chloride HYPERTONIC 3 % nebulizer solution Take by nebulization as needed for other. What changed: how much to take   sucralfate 1 GM/10ML suspension Commonly known as: Carafate Take 10 mLs (1 g total) by mouth 2 (two) times daily.   tamsulosin 0.4 MG Caps capsule Commonly known as: FLOMAX Take 0.4 mg by mouth daily.        Follow-up Information     [facility] Follow up.   Specialty: Home Health Services Why: Someone will call you to schedule first home visit. If you have not received a call after two days of discharging home, call their number listed. If no one comes to assess, call Case Manager at [phone]. Contact information: [address] [place] [address] [phone]                Discharge Exam: Filed Weights   01/09/23 1630 01/10/23 0136 01/13/23 0451  Weight: 100 kg 103.2 kg 98.5 kg   Vitals:   01/13/23 0451 01/13/23 0849  BP: 132/63 (!) 123/52  Pulse: 62 (!) 59  Resp: 17 18  Temp: 98 F (36.7 C) 97.8 F (36.6 C)  SpO2: 91% 94%   Examination: Physical Exam:  Constitutional: WN/WD obese Caucasian male in no acute distress appears calm Respiratory: Diminished to auscultation bilaterally with some coarse breath sounds and has some slight crackles and mild rhonchi, no wheezing, rales. Normal respiratory effort and patient is not tachypenic. No accessory muscle use.  Unlabored breathing Cardiovascular: RRR, no murmurs / rubs / gallops. S1 and S2 auscultated.  Mild 1+ lower extremity edema Abdomen: Soft, non-tender, distended secondary to body habitus. Bowel sounds positive.  GU: Deferred. Musculoskeletal: No clubbing / cyanosis of digits/nails. No joint deformity upper and lower extremities.  Skin: No rashes, lesions, ulcers on limited skin evaluation. No induration; Warm and dry.  Neurologic: CN 2-12 grossly intact with  no focal deficits. Romberg sign and cerebellar reflexes not assessed.  Psychiatric: Normal judgment and insight. Alert and oriented x 3. Normal mood and appropriate affect.   Condition at discharge: stable  The results of significant diagnostics from this hospitalization (including imaging, microbiology, ancillary and laboratory) are listed below for reference.   Imaging Studies: DG CHEST PORT 1 VIEW  Result Date: 01/13/2023 CLINICAL DATA:  141880 SOB (shortness of breath) [id number] EXAM: PORTABLE CHEST 1 VIEW COMPARISON:  CXR 01/09/23 FINDINGS: Right-sided  needle access chest port in place with tip in the right atrium. Left-sided dual lead cardiac device in place with unchanged lead positioning. Cardiomegaly. Likely trace left pleural effusion. No pneumothorax. Low lung volumes. There are prominent bilateral interstitial opacities could represent pulmonary venous congestion or bronchovascular crowding in the setting of low lung volumes. Hazy bibasilar airspace opacities likely represent atelectasis, but infection is not excluded. Degenerative changes of the bilateral glenohumeral joints IMPRESSION: 1. Cardiomegaly with prominent bilateral interstitial opacities which could represent pulmonary venous congestion or bronchovascular crowding in the setting of low lung volumes. 2. Hazy bibasilar airspace opacities likely represent atelectasis, but infection is not excluded. Electronically Signed   By: Lorenza Cambridge M.D.   On: 01/13/2023 08:12   DG Sacrum/Coccyx  Result Date: 01/10/2023 CLINICAL DATA:  Fall EXAM: SACRUM AND COCCYX - 2+ VIEW COMPARISON:  CT abdomen and pelvis 01/09/2023 FINDINGS: There is no acute fracture or dislocation identified. Healed mid sacral fracture with angulation appears unchanged. There are degenerative changes of the sacroiliac joints. Prostate radiotherapy seeds are present. There surgical clips in the lower abdomen. IMPRESSION: 1. No acute fracture or dislocation identified. 2.  Healed mid sacral fracture. Electronically Signed   By: Darliss Cheney M.D.   On: 01/10/2023 23:52   CT HEAD WO CONTRAST ( )  Result Date: 01/10/2023 CLINICAL DATA:  Fall, head trauma EXAM: CT HEAD WITHOUT CONTRAST TECHNIQUE: Contiguous axial images were obtained from the base of the skull through the vertex without intravenous contrast. RADIATION DOSE REDUCTION: This exam was performed according to the departmental dose-optimization program which includes automated exposure control, adjustment of the mA and/or kV according to patient size and/or use of iterative reconstruction technique. COMPARISON:  12/13/2022 FINDINGS: Brain: Age related volume loss. No acute intracranial abnormality. Specifically, no hemorrhage, hydrocephalus, mass lesion, acute infarction, or significant intracranial injury. Vascular: No hyperdense vessel or unexpected calcification. Skull: No acute calvarial abnormality. Sinuses/Orbits: No acute findings Other: None IMPRESSION: No acute intracranial abnormality. Electronically Signed   By: Charlett Nose M.D.   On: 01/10/2023 23:46   DG Wrist 2 Views Right  Result Date: 01/10/2023 CLINICAL DATA:  Fall EXAM: RIGHT WRIST - 2 VIEW COMPARISON:  None Available. FINDINGS: Degenerative changes at the 1st carpometacarpal joint. No acute bony abnormality. Specifically, no fracture, subluxation, or dislocation. IMPRESSION: No acute bony abnormality. Electronically Signed   By: Charlett Nose M.D.   On: 01/10/2023 23:45   NM HEPATOBILIARY LEAK (POST-SURGICAL)  Result Date: 01/10/2023 CLINICAL DATA:  Weakness, shortness of breath, lower extremity edema. Recent diagnosis pancreatic cancer. EXAM: NUCLEAR MEDICINE HEPATOBILIARY IMAGING TECHNIQUE: Sequential images of the abdomen were obtained out to 60 minutes following intravenous administration of radiopharmaceutical. RADIOPHARMACEUTICALS:  Five mCi Tc-29m  Choletec IV COMPARISON:  Ultrasound and CT exams of 01/09/2023 FINDINGS: There is satisfactory  uptake of radiopharmaceutical from the blood pool. Biliary activity visible 24 minutes. Bowel activity visible 34 minutes. Gallbladder activity visible 55 minutes. We followed activity out for 2 hours. There was some progressive filling of the gallbladder with radiopharmaceutical, and no leak of radiopharmaceutical from the gallbladder or biliary tree is visible. IMPRESSION: 1. Patent cystic duct and patent common bile duct. No leak of radiopharmaceutical from the biliary tree or gallbladder was visible over the 2 hour observation. Electronically Signed   By: Gaylyn Rong M.D.   On: 01/10/2023 15:55   US Abdomen Limited  Result Date: 01/09/2023 CLINICAL DATA:  Recent diagnosis of pancreatic cancer, presenting with fever. EXAM: ULTRASOUND ABDOMEN LIMITED RIGHT UPPER  QUADRANT COMPARISON:  None Available. FINDINGS: Gallbladder: A moderate amount of echogenic sludge is seen within the lumen of a distended gallbladder (approximately 12.1 cm in length). No gallstones are identified. The gallbladder wall measures 5.6 mm in thickness. No sonographic Murphy sign noted by sonographer. Common bile duct: Diameter: 6.5 mm Liver: A 1.9 cm x 1.4 cm x 1.3 cm cyst is seen within the left lobe of the liver. Multiple, ill-defined hyperechoic foci are seen scattered throughout the liver parenchyma. Portal vein is patent on color Doppler imaging with normal direction of blood flow towards the liver. Other: None. IMPRESSION: 1. Gallbladder sludge within a markedly distended gallbladder. 2. Multiple echogenic liver lesions which may represent sequelae associated with hepatic metastasis. 3. Simple left renal cyst. Electronically Signed   By: Aram Candela M.D.   On: 01/09/2023 22:25   CT Angio Chest PE W and/or Wo Contrast  Result Date: 01/09/2023 CLINICAL DATA:  PE suspected, metastatic pancreatic cancer * Tracking Code: BO * EXAM: CT ANGIOGRAPHY CHEST WITH CONTRAST TECHNIQUE: Multidetector CT imaging of the chest was  performed using the standard protocol during bolus administration of intravenous contrast. Multiplanar CT image reconstructions and MIPs were obtained to evaluate the vascular anatomy. RADIATION DOSE REDUCTION: This exam was performed according to the departmental dose-optimization program which includes automated exposure control, adjustment of the mA and/or kV according to patient size and/or use of iterative reconstruction technique. CONTRAST:  75mL OMNIPAQUE IOHEXOL 350 MG/ML SOLN COMPARISON:  09/27/2019 FINDINGS: Cardiovascular: Satisfactory opacification of the pulmonary arteries to the segmental level. No evidence of pulmonary embolism. Mild cardiomegaly. Left and right coronary artery calcifications. Left chest multi lead pacer defibrillator. No pericardial effusion. Aortic atherosclerosis. Right chest port catheter. Mediastinum/Nodes: No enlarged mediastinal, hilar, or axillary lymph nodes. Thyroid gland, trachea, and esophagus demonstrate no significant findings. Lungs/Pleura: Small bilateral pleural effusions and associated atelectasis or consolidation. Acute appearing, heterogeneous airspace opacity of the superior segment left lower lobe (series 4, image 62). Upper Abdomen: Please see separately reported examination of the abdomen and pelvis Musculoskeletal: No chest wall abnormality. No acute osseous findings. Disc degenerative disease and bridging osteophytosis throughout the thoracic spine, in keeping with DISH. Review of the MIP images confirms the above findings. IMPRESSION: 1. Negative examination for pulmonary embolism. 2. Small bilateral pleural effusions and associated atelectasis or consolidation. 3. Acute appearing, heterogeneous airspace opacity of the superior segment left lower lobe, consistent with infection or aspiration. 4. Coronary artery disease. Aortic Atherosclerosis (ICD10-I70.0). Electronically Signed   By: Jearld Lesch M.D.   On: 01/09/2023 21:09   CT ABDOMEN PELVIS W  CONTRAST  Result Date: 01/09/2023 CLINICAL DATA:  Abdominal pain, pancreatic mass, * Tracking Code: BO * EXAM: CT ABDOMEN AND PELVIS WITH CONTRAST TECHNIQUE: Multidetector CT imaging of the abdomen and pelvis was performed using the standard protocol following bolus administration of intravenous contrast. RADIATION DOSE REDUCTION: This exam was performed according to the departmental dose-optimization program which includes automated exposure control, adjustment of the mA and/or kV according to patient size and/or use of iterative reconstruction technique. CONTRAST:  75mL OMNIPAQUE IOHEXOL 350 MG/ML SOLN COMPARISON:  CT abdomen pelvis, 12/03/2022 FINDINGS: Lower chest: Please see separately reported examination of the chest Hepatobiliary: Multiple low-attenuation liver lesions are unchanged, the largest of which are clearly benign fluid attenuation cysts, others more ill-defined and incompletely characterized, although remains suspicious for small metastases. Distended gallbladder mild gallbladder wall thickening and adjacent pericholecystic fat stranding (series 5, image 43). Sludge in the gallbladder fundus (series 9,  image 78). Common bile duct stent has been placed, with diminished, although persistent intrahepatic biliary ductal dilatation (series 5, image 31, series 8, image 74). Pancreas: Similar appearance of ill-defined hypodense mass in the superior pancreatic head measuring 2.7 x 2.7 cm (series 5, image 37). Mild atrophy of the distal pancreas with prominence of the pancreatic duct measuring up to 0.5 cm, unchanged (series 5, image 35). Spleen: Normal in size without significant abnormality. Adrenals/Urinary Tract: Adrenal glands are unremarkable. Simple, benign bilateral renal cortical cysts, for which no further follow-up or characterization is required. Kidneys are normal, without renal calculi, solid lesion, or hydronephrosis. Bladder is unremarkable. Stomach/Bowel: Status post Roux type gastric  bypass. Appendix appears normal. No evidence of bowel wall thickening, distention, or inflammatory changes. Status post sigmoid colon resection and reanastomosis. Vascular/Lymphatic: Aortic atherosclerosis. No enlarged abdominal or pelvic lymph nodes. Reproductive: Prostate brachytherapy. Other: Mesh repair of broad-based midline ventral hernia. Anasarca. No ascites. Musculoskeletal: No acute or significant osseous findings. IMPRESSION: 1. Distended gallbladder with mild gallbladder wall thickening and adjacent pericholecystic fat stranding. Sludge in the gallbladder fundus. Findings are concerning for acute cholecystitis. 2. Common bile duct stent has been placed, with diminished, although persistent intrahepatic biliary ductal dilatation. 3. Similar appearance of ill-defined hypodense mass in the superior pancreatic head, consistent with known pancreatic adenocarcinoma. Mild atrophy of the distal pancreas with prominence of the pancreatic duct measuring up to 0.5 cm, unchanged. 4. Multiple low-attenuation liver lesions are unchanged, the largest of which are clearly benign fluid attenuation cysts, others more ill-defined and incompletely characterized, although in keeping with small metastases. 5. Status post Roux type gastric bypass. 6. Status post sigmoid colon resection and reanastomosis. 7. Prostate brachytherapy. Aortic Atherosclerosis (ICD10-I70.0). Electronically Signed   By: Jearld Lesch M.D.   On: 01/09/2023 21:05   DG Chest 2 View  Result Date: 01/09/2023 CLINICAL DATA:  Suspected sepsis EXAM: CHEST - 2 VIEW COMPARISON:  Radiograph 11/24/2022 FINDINGS: LEFT-sided pacer overlies normal cardiac silhouette. Port in the anterior chest wall with tip in distal SVC. Elevation LEFT hemidiaphragm. No effusion, infiltrate or pneumothorax. Hyperinflated lungs. IMPRESSION: 1. No acute cardiopulmonary process. 2. Hyperinflated lungs suggest COPD. Electronically Signed   By: Genevive Bi M.D.   On: 01/09/2023  17:28   IR IMAGING GUIDED PORT INSERTION  Result Date: 01/04/2023 INDICATION: History of metastatic pancreatic cancer. In need of durable intravenous access for chemotherapy administration. EXAM: IMPLANTED PORT A CATH PLACEMENT WITH ULTRASOUND AND FLUOROSCOPIC GUIDANCE COMPARISON:  None Available. MEDICATIONS: None ANESTHESIA/SEDATION: Moderate (conscious) sedation was employed during this procedure as administered by the Interventional Radiology RN. A total of Versed 2 mg and Fentanyl 100 mcg was administered intravenously. Moderate Sedation Time: 23 minutes. The patient's level of consciousness and vital signs were monitored continuously by radiology nursing throughout the procedure under my direct supervision. CONTRAST:  None FLUOROSCOPY TIME:  24 seconds (2 mGy) COMPLICATIONS: None immediate. PROCEDURE: The procedure, risks, benefits, and alternatives were explained to the patient. Questions regarding the procedure were encouraged and answered. The patient understands and consents to the procedure. The right neck and chest were prepped with chlorhexidine in a sterile fashion, and a sterile drape was applied covering the operative field. Maximum barrier sterile technique with sterile gowns and gloves were used for the procedure. A timeout was performed prior to the initiation of the procedure. Local anesthesia was provided with 1% lidocaine with epinephrine. After creating a small venotomy incision, a micropuncture kit was utilized to access the internal jugular vein. Real-time  ultrasound guidance was utilized for vascular access including the acquisition of a permanent ultrasound image documenting patency of the accessed vessel. The microwire was utilized to measure appropriate catheter length. A subcutaneous port pocket was then created along the upper chest wall utilizing a combination of sharp and blunt dissection. The pocket was irrigated with sterile saline. A single lumen Slim sized power injectable  port was chosen for placement. The 8 Fr catheter was tunneled from the port pocket site to the venotomy incision. The port was placed in the pocket. The external catheter was trimmed to appropriate length. At the venotomy, an 8 Fr peel-away sheath was placed over a guidewire under fluoroscopic guidance. The catheter was then placed through the sheath and the sheath was removed. Final catheter positioning was confirmed and documented with a fluoroscopic spot radiograph. The port was accessed with a Huber needle, aspirated and flushed with heparinized saline. The venotomy site was closed with an interrupted 4-0 Vicryl suture. The port pocket incision was closed with interrupted 2-0 Vicryl suture. Dermabond and Steri-strips were applied to both incisions. Dressings were applied. The patient tolerated the procedure well without immediate post procedural complication. FINDINGS: After catheter placement, the tip lies within the superior cavoatrial junction. The catheter aspirates and flushes normally and is ready for immediate use. IMPRESSION: Successful placement of a right internal jugular approach power injectable Port-A-Cath. The catheter is ready for immediate use. Electronically Signed   By: Simonne Come M.D.   On: 01/04/2023 16:27    Microbiology: Results for orders placed or performed during the hospital encounter of 01/09/23  Culture, blood (Routine x 2)     Status: None   Collection Time: 01/09/23  4:53 PM   Specimen: BLOOD  Result Value Ref Range Status   Specimen Description BLOOD RIGHT ANTECUBITAL  Final   Special Requests   Final    BOTTLES DRAWN AEROBIC AND ANAEROBIC Blood Culture adequate volume   Culture   Final    NO GROWTH 5 DAYS Performed at [facility], [address], [place], [place] [address]    Report Status 01/14/2023 FINAL  Final  Respiratory (~20 pathogens) panel by PCR     Status: None   Collection Time: 01/09/23  6:13 PM   Specimen: Nasopharyngeal Swab; Respiratory   Result Value Ref Range Status   Adenovirus NOT DETECTED NOT DETECTED Final   Coronavirus 229E NOT DETECTED NOT DETECTED Final    Comment: (NOTE) The Coronavirus on the Respiratory Panel, DOES NOT test for the novel  Coronavirus (2019 nCoV)    Coronavirus HKU1 NOT DETECTED NOT DETECTED Final   Coronavirus NL63 NOT DETECTED NOT DETECTED Final   Coronavirus OC43 NOT DETECTED NOT DETECTED Final   Metapneumovirus NOT DETECTED NOT DETECTED Final   Rhinovirus / Enterovirus NOT DETECTED NOT DETECTED Final   Influenza A NOT DETECTED NOT DETECTED Final   Influenza B NOT DETECTED NOT DETECTED Final   Parainfluenza Virus 1 NOT DETECTED NOT DETECTED Final   Parainfluenza Virus 2 NOT DETECTED NOT DETECTED Final   Parainfluenza Virus 3 NOT DETECTED NOT DETECTED Final   Parainfluenza Virus 4 NOT DETECTED NOT DETECTED Final   Respiratory Syncytial Virus NOT DETECTED NOT DETECTED Final   Bordetella pertussis NOT DETECTED NOT DETECTED Final   Bordetella Parapertussis NOT DETECTED NOT DETECTED Final   Chlamydophila pneumoniae NOT DETECTED NOT DETECTED Final   Mycoplasma pneumoniae NOT DETECTED NOT DETECTED Final    Comment: Performed at [facility], [address], [place], [place] [address]  Culture, blood (Routine x 2)     Status: None   Collection Time: 01/09/23  6:53 PM   Specimen: BLOOD  Result Value Ref Range Status   Specimen Description BLOOD RIGHT ANTECUBITAL  Final   Special Requests   Final    BOTTLES DRAWN AEROBIC AND ANAEROBIC Blood Culture results may not be optimal due to an excessive volume of blood received in culture bottles   Culture   Final    NO GROWTH 5 DAYS Performed at [facility], [address], [place], [place] [address]    Report Status 01/14/2023 FINAL  Final  Urine Culture     Status: None   Collection Time: 01/10/23 11:32 AM   Specimen: Urine, Clean Catch  Result Value Ref Range Status   Specimen Description URINE, CLEAN CATCH  Final   Special  Requests NONE  Final   Culture   Final    NO GROWTH Performed at [facility], [address], [place], [place] [address]    Report Status 01/11/2023 FINAL  Final  MRSA Next Gen by PCR, Nasal     Status: None   Collection Time: 01/11/23 11:54 AM   Specimen: Nasal Mucosa; Nasal Swab  Result Value Ref Range Status   MRSA by PCR Next Gen NOT DETECTED NOT DETECTED Final    Comment: (NOTE) The GeneXpert MRSA Assay (FDA approved for NASAL specimens only), is one component of a comprehensive MRSA colonization surveillance program. It is not intended to diagnose MRSA infection nor to guide or monitor treatment for MRSA infections. Test performance is not FDA approved in patients less than 31 years old. Performed at [facility], [address], [place], [place] [address]    Labs: CBC: Recent Labs  Lab 01/09/23 1635 01/10/23 0144 01/11/23 0306 01/12/23 0500 01/12/23 0732 01/13/23 0345  WBC 5.0 3.9* 2.8* 2.1*  2.0*  --  1.8*  NEUTROABS 4.4  --  2.2 1.2* 1.2* 0.9*  HGB 8.9* 8.1* 7.5* 8.1*  8.1*  --  7.8*  HCT 28.1* 24.9* 23.3* 25.3*  25.5*  --  23.9*  MCV 93.7 92.6 92.8 93.0  93.1  --  91.6  PLT 160 131* 126* 125*  128*  --  117*   Basic Metabolic Panel: Recent Labs  Lab 01/09/23 1635 01/09/23 1809 01/10/23 0144 01/11/23 0306 01/12/23 0500 01/13/23 0345  NA 135  --  137 134* 139 140  K 3.3*  --  3.2* 3.1* 3.1* 3.3*  CL 105  --  107 106 105 105  CO2 21*  --  20* 21* 23 26  GLUCOSE 145*  --  147* 117* 128* 121*  BUN 17  --  20 20 21 21   CREATININE 1.10  --  1.15 1.17 1.23 1.30*  CALCIUM 7.9*  --  7.8* 7.6* 8.0* 8.1*  MG  --  1.9 1.7 1.8 1.7 2.0  PHOS  --   --  2.1*  --   --  3.0   Liver Function Tests: Recent Labs  Lab 01/09/23 1635 01/10/23 0144 01/11/23 0306 01/13/23 0345  AST 26 21 23  34  ALT 20 22 22 28   ALKPHOS 140* 117 101 100  BILITOT 1.5* 1.7* 1.1 0.9  PROT 5.7* 5.3* 5.0* 5.1*  ALBUMIN 2.6* 2.2* 2.1* 2.1*   CBG: No results for  input(s): "GLUCAP" in the last 168 hours.  Discharge time spent: greater than 30 minutes.  Signed: Marguerita Merles, DO  [facility] 01/15/2023

## 2023-01-13 NOTE — Progress Notes (Cosign Needed)
    Durable Medical Equipment  (From admission, onward)           Start     Ordered   01/13/23 1244  For home use only DME Bedside commode  Once       Question:  Patient needs a bedside commode to treat with the following condition  Answer:  Weakness   01/13/23 1244   01/13/23 1225  DME Walker  Once       Question Answer Comment  Walker: With 5 Inch Wheels   Patient needs a walker to treat with the following condition Generalized weakness      01/13/23 1227   01/13/23 1220  For home use only DME Walker rolling  Once       Question Answer Comment  Walker: With 5 Inch Wheels   Patient needs a walker to treat with the following condition Generalized weakness      01/13/23 1219

## 2023-01-13 NOTE — Progress Notes (Signed)
Per Dr. Truett Perna: [facility] d/c today. Needs CBC on 7/15. Order placed and scheduler notified

## 2023-01-14 ENCOUNTER — Encounter: Payer: Self-pay | Admitting: Genetic Counselor

## 2023-01-14 ENCOUNTER — Telehealth: Payer: Self-pay

## 2023-01-14 ENCOUNTER — Telehealth: Payer: Self-pay | Admitting: Genetic Counselor

## 2023-01-14 DIAGNOSIS — Z1379 Encounter for other screening for genetic and chromosomal anomalies: Secondary | ICD-10-CM | POA: Insufficient documentation

## 2023-01-14 LAB — CULTURE, BLOOD (ROUTINE X 2)
Culture: NO GROWTH
Culture: NO GROWTH
Special Requests: ADEQUATE

## 2023-01-14 NOTE — Transitions of Care (Post Inpatient/ED Visit) (Signed)
01/14/2023  Name: Marcus [name] MRN: [id number] DOB: 09/08/1940  Today's TOC FU Call Status: Today's TOC FU Call Status:: Successful TOC FU Call Competed TOC FU Call Complete Date: 01/14/23  Transition Care Management Follow-up Telephone Call Date of Discharge: 01/13/23 Discharge Facility: [facility]) Type of Discharge: Inpatient Admission Primary Inpatient Discharge Diagnosis:: "PNA of LLL due to infectious organism" How have you been since you were released from the hospital?: Better (Pt voices things have "been great so far-eating well and got a good night sleep." He just got back form picking new meds up from pharmacy and will begin taking them. No BM in about 3 days-states he "picked up some Ex-lax to take today.") Any questions or concerns?: No  Items Reviewed: Did you receive and understand the discharge instructions provided?: Yes Medications obtained,verified, and reconciled?: Yes (Medications Reviewed) Any new allergies since your discharge?: No Dietary orders reviewed?: Yes Type of Diet Ordered:: low salt/heart healthy Do you have support at home?: Yes People in Home: alone, child(ren), adult Name of Support/Comfort Primary Source: son helps him out as needed  Medications Reviewed Today: Medications Reviewed Today     Reviewed by Charlyn Minerva, RN (Registered Nurse) on 01/14/23 at 1259  Med List Status: <None>   Medication Order Taking? Sig Documenting Provider Last Dose Status Informant  acetaminophen (TYLENOL) 325 MG tablet [id number] Yes Take 2 tablets (650 mg total) by mouth every 6 (six) hours as needed for mild pain (or Fever >/= 101). Sheikh, Omair [place], DO Taking Active   allopurinol (ZYLOPRIM) 300 MG tablet [id number] Yes Take 1 tablet (300 mg total) by mouth daily. Nafziger, Kandee Keen, NP Taking Active Self  amLODipine (NORVASC) 10 MG tablet [id number] Yes Take 1 tablet (10 mg total) by mouth daily. Nafziger, Kandee Keen, NP Taking Active Self   amoxicillin-clavulanate (AUGMENTIN) 875-125 MG tablet [id number] Yes Take 1 tablet by mouth every 12 (twelve) hours for 3 days. Sheikh, Omair [place], DO Taking Active   apixaban (ELIQUIS) 5 MG TABS tablet [id number] Yes Take 1 tablet (5 mg total) by mouth 2 (two) times daily. Mansouraty, Netty Starring., MD Taking Active Self           Med Note [facility], [place] Jan 09, 2023 11:05 PM) On hold due to endoscopic ultrasound on 12/09/22 and hasn't been told to resume.  Calcium Carb-Cholecalciferol (CALCIUM 600 + D PO) [id number] Yes Take 1 tablet by mouth daily. 800 units of vitamin D [provider] Taking Active Self  esomeprazole (NEXIUM) 40 MG capsule [id number] Yes Take 1 capsule (40 mg total) by mouth 2 (two) times daily before a meal. Mansouraty, Netty Starring., MD Taking Active Self           Med Note [facility], [place] Jan 09, 2023 11:06 PM) On hold due to endoscopic ultrasound on 12/09/22 and hasn't been told to resume.   furosemide (LASIX) 40 MG tablet [id number] Yes Take 1 tablet (40 mg total) by mouth daily. Sheikh, Omair [place], DO Taking Active   guaiFENesin (MUCINEX) 600 MG 12 hr tablet [id number] Yes Take 1 tablet (600 mg total) by mouth 2 (two) times daily for 5 days. Sheikh, Omair [place], DO Taking Active   hydrOXYzine (ATARAX) 50 MG tablet [id number] Yes Take 1 tablet (50 mg total) by mouth 3 (three) times daily as needed.  Patient taking differently: Take 50 mg by mouth 3 (three) times daily as needed for itching.   [name], NP Taking  Active Self  lidocaine-prilocaine (EMLA) cream [id number] No Apply 1 Application topically as needed.  Patient not taking: Reported on 01/09/2023   [name], MD Not Taking Active Self  Multiple Vitamin (MULTIVITAMIN) tablet [id number] Yes Take 1 tablet by mouth in the morning. [provider] Taking Active Self  ondansetron (ZOFRAN) 8 MG tablet [id number] No Take 1 tablet (8 mg total) by mouth every 8 (eight) hours as needed.  Patient  not taking: Reported on 01/09/2023   [name], MD Not Taking Active Self  polyethylene glycol powder (GLYCOLAX/MIRALAX) 17 GM/SCOOP powder [id number] Yes Take 17g (1 capful) by mouth as directed daily as needed for mild constipation. Rolly Salter, MD Taking Active Self  potassium chloride SA (KLOR-CON M) 20 MEQ tablet [id number] Yes Take 1 tablet (20 mEq total) by mouth daily. Marguerita Merles [place], DO Taking Active   Respiratory Therapy Supplies (FLUTTER) DEVI [id number]  1 each by Does not apply route in the morning and at bedtime. [name], DO  Active Self  sodium chloride HYPERTONIC 3 % nebulizer solution [id number]  Take by nebulization as needed for other.  Patient taking differently: Take 4 mLs by nebulization as needed for other.   [name], NP  Active Self  sucralfate (CARAFATE) 1 GM/10ML suspension [id number] Yes Take 10 mLs (1 g total) by mouth 2 (two) times daily. Mansouraty, Netty Starring., MD Taking Active Self  tamsulosin [facility]) 0.4 MG CAPS capsule [id number] Yes Take 0.4 mg by mouth daily. [provider] Taking Active Self           Med Note (DAVIS, SOPHIA A   Mon Aug 09, 2016 10:27 AM)              Home Care and Equipment/Supplies: Were Home Health Services Ordered?: Yes Name of Home Health Agency:: [facility] Has Agency set up a time to come to your home?: No (Pt provided with contact info to call if he does not hear from agency within 48-72hrs post discharge.) Any new equipment or medical supplies ordered?: Yes Name of Medical supply agency?: [facility] Were you able to get the equipment/medical supplies?: Yes Do you have any questions related to the use of the equipment/supplies?: No  Functional Questionnaire: Do you need assistance with bathing/showering or dressing?: No Do you need assistance with meal preparation?: No Do you need assistance with eating?: No Do you have difficulty maintaining continence: No Do you need assistance with  getting out of bed/getting out of a chair/moving?: No Do you have difficulty managing or taking your medications?: No  Follow up appointments reviewed: PCP Follow-up appointment confirmed?: Yes Date of PCP follow-up appointment?: 01/25/23 Follow-up Provider: [facility] Follow-up appointment confirmed?: Yes Date of Specialist follow-up appointment?: 01/26/23 Follow-Up Specialty Provider:: pt [facility], Dr. Thomas(oncologist)-01/21/23 Do you need transportation to your follow-up appointment?: No (pt confirms pt able to get appts) Do you understand care options if your condition(s) worsen?: Yes-patient verbalized understanding  SDOH Interventions Today    Flowsheet Row Most Recent Value  SDOH Interventions   Food Insecurity Interventions Intervention Not Indicated  Transportation Interventions Intervention Not Indicated        TOC Interventions Today    Flowsheet Row Most Recent Value  TOC Interventions   TOC Interventions Discussed/Reviewed TOC Interventions Discussed, Arranged PCP follow up less than 12 days/Care Guide scheduled, S/S of infection      Interventions Today    Flowsheet Row Most Recent  Value  General Interventions   General Interventions Discussed/Reviewed General Interventions Discussed, Doctor Visits, Referral to Nurse  [pt declined]  Doctor Visits Discussed/Reviewed Doctor Visits Discussed, PCP, Specialist  PCP/Specialist Visits Compliance with follow-up visit  Education Interventions   Education Provided Provided Education  Provided Verbal Education On Nutrition, When to see the doctor, Medication, Other  [sx mgmt, bowel regimen mgmt]  Nutrition Interventions   Nutrition Discussed/Reviewed Nutrition Discussed, Adding fruits and vegetables, Increasing proteins, Decreasing fats, Decreasing salt, Fluid intake  Pharmacy Interventions   Pharmacy Dicussed/Reviewed Pharmacy Topics Discussed, Medications and  their functions  Safety Interventions   Safety Discussed/Reviewed Safety Discussed       Alessandra Grout [facility] Care Management Care Management Community Coordinator Direct Phone: [phone] Toll Free: [phone] Fax: [phone]

## 2023-01-14 NOTE — Telephone Encounter (Signed)
I contacted Mr. Chisum to discuss his genetic testing results. No pathogenic variants were identified in the 77 genes analyzed. Detailed clinic note to follow.  The test report has been scanned into EPIC and is located under the Molecular Pathology section of the Results Review tab.  A portion of the result report is included below for reference.   Lalla Brothers, MS, [facility] Genetic Counselor [email or web address] (P) [phone]

## 2023-01-15 NOTE — Progress Notes (Signed)
Cardiology Office Note:  .   Date:  01/18/2023  ID:  Marcus [name], DOB 1941-01-04, MRN [id number] PCP: Marcus Frees, NP  [facility] Providers Cardiologist:  Marcus [name]   History of Present Illness: .   Marcus [name] is a 82 y.o. male with a past medical history of CAD, CHF, CHB s/p PPM, paroxysmal atrial fibrillation, arthritis, depression, OSA, GERD, obesity, hypertension, hyperlipidemia. Patient is followed by Dr. Jens [name], Dr. Ladona [name] and presents today for follow-up for heart failure and preoperative evaluation.  Per chart review, patient has a long history of atypical chest pain.  Echocardiogram in 04/2010 showed normal LV function, moderate left atrial enlargement, mild right atrial enlargement, grade 1 diastolic dysfunction.  Nuclear stress test in 10/2012 showed EF 59% with a small scar in the inferior wall but no ischemia.  Echocardiogram 07/2018 showed EF 55-60%, no regional wall motion abnormalities, grade 1 diastolic dysfunction, mild dilation of the ascending aorta.  Cardiac monitor in 07/2018 showed sinus with Mobitz 1, 2-1 AV block, complete heart block, junctional rhythm.  Due to findings of complete heart block, symptomatic bradycardia, patient underwent successful implantation of a Biotronik dual-chamber pacemaker with Dr. Ladona [name] on 08/02/2018.  He was last seen by Dr. Ladona [name] on 10/05/2022.  At that time, patient's pacemaker was functioning normally.  Pacemaker did note that he had up to 1.5 hours of atrial fibrillation/flutter.  He was started on Coumadin for anticoagulation.  Patient was admitted from 6/7 - 12/15/2022 with biliary obstruction.  CT showed a 2 cm pancreatic mass within the proximal body, biliary and pancreatic ductal dilation and multiple liver lesions concerning for liver malignancy.  He underwent ERCP on 6/10 that showed a single localized biliary stricture in the CBD concerning for malignancy that was aspirated and sent for cytology.  Stricture was  treated with stent placement.  Patient was transitioned to Eliquis.  He underwent echocardiogram on 6/11 that showed EF 45-50%, no regional wall motion abnormalities, grade 1 diastolic dysfunction, normal RV systolic function, trivial mitral valve regurgitation.  He underwent /endoscopic ultrasound on 6/20 to evaluate the pancreatic mass noted on imaging s/p recent ERCP with negative brushings.Study showed a mass in the pancrease head, staged as T2N0 by ultrasound criteria. FNA of the mass was performed and revealed adenocarcinoma.   Patient was recently admitted from 7/7-7/11 with sepsis secondary to pneumonia and acute cholecystitis.  Patient had recently completed his first session of chemotherapy on 7/5.  He presented to the ED on 7/7 complaining of generalized weakness, lower extremity edema, exertional shortness of breath.  He was admitted for sepsis secondary to community-acquired pneumonia as well as concerns for combined CHF exacerbation.  GI was consulted for ERCP and bile duct exchange, but HIDA scan on 7/8 showed no concerning findings and therefore patient did not require any procedures.  CTA chest showed small bilateral pleural effusions, acute appearing heterogeneous airspace opacity at the superior segment of the left lobe consistent with infection/aspiration.  Patient was treated with IV fluids and broad-spectrum antibiotics.  Repeat chest x-ray done prior to discharge showed cardiomegaly with prominent bilateral interstitial opacities that could represent pulmonary venous congestion or bronchovascular crowding.  Patient was instructed to follow-up with cardiology in the outpatient setting.  Of note, there are notes that patient is pending ERCP on 8/5. However, patient denies being scheduled for any procedures.   On interview today, patient reports that he is somewhat frustrated by how many doctors appointments and tests he has had recently.  There have been multiple medication changes in the  past few months, and he is frustrated that it is so confusing. He denies chest pain, shortness of breath, cough, dizziness, syncope, near syncope. He is able to walk up and down stairs, do chores, and do yard work without chest pain or shortness of breath. He has ankle edema, but it does not bother him very much. Does not want to wear compression stockings. He does not want to make any medication changes without talking to his oncologist first. He is scheduled for a sleep study tomorrow, and has his second round of chemotherapy later this week.  ROS: Denies chest pain, DOE, orthopnea, palpitations, syncope, near syncope, dizziness.   Studies Reviewed: .    Cardiac Studies & Procedures       ECHOCARDIOGRAM  ECHOCARDIOGRAM COMPLETE 12/14/2022  Narrative ECHOCARDIOGRAM REPORT    Patient Name:   Marcus [name] Date of Exam: 12/14/2022 Medical Rec #:  [id number]       Height:       68.0 in Accession #:    [id number]      Weight:       213.0 lb Date of Birth:  [birth year]       BSA:          2.099 m Patient Age:    81 years        BP:           134/60 mmHg Patient Gender: M               HR:           93 bpm. Exam Location:  Inpatient  Procedure: 2D Echo, Cardiac Doppler, Color Doppler and Intracardiac Opacification Agent  Indications:    Stroke  History:        Patient has prior history of Echocardiogram examinations, most recent 07/24/2018. CAD, cancer, Arrythmias:Atrial Fibrillation and AV block; Risk Factors:Sleep Apnea, Hypertension and Former Smoker.  Sonographer:    Marcus [name] Referring Phys: [id number] Marcus [name]   Sonographer Comments: Technically challenging study due to limited acoustic windows. IMPRESSIONS   1. LVEF mildly reduced with apical motion consistent with RV pacing. Left ventricular ejection fraction, by estimation, is 45 to 50%. The left ventricle has mildly decreased function. The left ventricle has no regional wall motion abnormalities. There is mild  concentric left ventricular hypertrophy. Left ventricular diastolic parameters are consistent with Grade I diastolic dysfunction (impaired relaxation). 2. Right ventricular systolic function is normal. The right ventricular size is normal. Tricuspid regurgitation signal is inadequate for assessing PA pressure. 3. The mitral valve is grossly normal. Trivial mitral valve regurgitation. No evidence of mitral stenosis. 4. The aortic valve was not well visualized. Aortic valve regurgitation is not visualized. No aortic stenosis is present. 5. The inferior vena cava is normal in size with greater than 50% respiratory variability, suggesting right atrial pressure of 3 mmHg.  Conclusion(s)/Recommendation(s): No intracardiac source of embolism detected on this transthoracic study. Consider a transesophageal echocardiogram to exclude cardiac source of embolism if clinically indicated.  FINDINGS Left Ventricle: LVEF mildly reduced with apical motion consistent with RV pacing. Left ventricular ejection fraction, by estimation, is 45 to 50%. The left ventricle has mildly decreased function. The left ventricle has no regional wall motion abnormalities. Definity contrast agent was given IV to delineate the left ventricular endocardial borders. The left ventricular internal cavity size was normal in size. There is mild concentric left ventricular hypertrophy. Abnormal (paradoxical) septal motion, consistent with  RV pacemaker. Left ventricular diastolic parameters are consistent with Grade I diastolic dysfunction (impaired relaxation).  Right Ventricle: The right ventricular size is normal. No increase in right ventricular wall thickness. Right ventricular systolic function is normal. Tricuspid regurgitation signal is inadequate for assessing PA pressure.  Left Atrium: Left atrial size was normal in size.  Right Atrium: Right atrial size was normal in size.  Pericardium: There is no evidence of pericardial  effusion.  Mitral Valve: The mitral valve is grossly normal. Trivial mitral valve regurgitation. No evidence of mitral valve stenosis. MV peak gradient, 3.9 mmHg. The mean mitral valve gradient is 1.0 mmHg.  Tricuspid Valve: The tricuspid valve is grossly normal. Tricuspid valve regurgitation is not demonstrated. No evidence of tricuspid stenosis.  Aortic Valve: The aortic valve was not well visualized. Aortic valve regurgitation is not visualized. No aortic stenosis is present. Aortic valve mean gradient measures 6.5 mmHg. Aortic valve peak gradient measures 12.5 mmHg. Aortic valve area, by VTI measures 3.05 cm.  Pulmonic Valve: The pulmonic valve was not well visualized.  Aorta: The aortic root and ascending aorta are structurally normal, with no evidence of dilitation.  Venous: The inferior vena cava is normal in size with greater than 50% respiratory variability, suggesting right atrial pressure of 3 mmHg.  IAS/Shunts: The atrial septum is grossly normal.   LEFT VENTRICLE PLAX 2D LVIDd:         5.20 cm      Diastology LVIDs:         4.10 cm      LV e' medial:    5.50 cm/s LV PW:         1.10 cm      LV E/e' medial:  12.6 LV IVS:        1.20 cm      LV e' lateral:   4.32 cm/s LVOT diam:     2.10 cm      LV E/e' lateral: 16.0 LV SV:         104 LV SV Index:   50 LVOT Area:     3.46 cm  LV Volumes (MOD) LV vol d, MOD A2C: 223.0 ml LV vol d, MOD A4C: 185.0 ml LV vol s, MOD A2C: 126.0 ml LV vol s, MOD A4C: 108.0 ml LV SV MOD A2C:     97.0 ml LV SV MOD A4C:     185.0 ml LV SV MOD BP:      87.1 ml  RIGHT VENTRICLE             IVC RV Basal diam:  3.90 cm     IVC diam: 1.60 cm RV S prime:     18.10 cm/s TAPSE (M-mode): 2.8 cm  LEFT ATRIUM             Index        RIGHT ATRIUM           Index LA Vol (A2C):   86.6 ml 41.26 ml/m  RA Area:     22.90 cm LA Vol (A4C):   99.3 ml 47.31 ml/m  RA Volume:   64.90 ml  30.92 ml/m LA Biplane Vol: 92.7 ml 44.16 ml/m AORTIC VALVE AV  Area (Vmax):    2.78 cm AV Area (Vmean):   3.01 cm AV Area (VTI):     3.05 cm AV Vmax:           177.00 cm/s AV Vmean:  121.000 cm/s AV VTI:            0.342 m AV Peak Grad:      12.5 mmHg AV Mean Grad:      6.5 mmHg LVOT Vmax:         142.00 cm/s LVOT Vmean:        105.000 cm/s LVOT VTI:          0.301 m LVOT/AV VTI ratio: 0.88  AORTA Ao Root diam: 3.60 cm Ao Asc diam:  3.30 cm  MITRAL VALVE MV Area (PHT): 2.17 cm     SHUNTS MV Area VTI:   3.38 cm     Systemic VTI:  0.30 m MV Peak grad:  3.9 mmHg     Systemic Diam: 2.10 cm MV Mean grad:  1.0 mmHg MV Vmax:       0.99 m/s MV Vmean:      56.1 cm/s MV Decel Time: 350 msec MV E velocity: 69.10 cm/s MV A velocity: 111.00 cm/s MV E/A ratio:  0.62  Lennie Odor [name] Electronically signed by Lennie Odor [name] Signature Date/Time: 12/14/2022/3:39:36 PM    Final    MONITORS  CARDIAC EVENT MONITOR 07/24/2018  Narrative Sinus with mobitz 1, 2:1 av block, complete heart block, junctional rhythm, 8 beats nsvt Marcus [name]            Risk Assessment/Calculations:    CHA2DS2-VASc Score = 4   This indicates a 4.8% annual risk of stroke. The patient's score is based upon: CHF History: 0 HTN History: 1 Diabetes History: 0 Stroke History: 0 Vascular Disease History: 1 Age Score: 2 Gender Score: 0            Physical Exam:   VS:  BP 138/66   Pulse 61   Ht 5\' 8"  (1.727 m)   Wt 216 lb 12.8 oz (98.3 kg)   SpO2 99%   BMI 32.96 kg/m    Wt Readings from Last 3 Encounters:  01/18/23 216 lb 12.8 oz (98.3 kg)  01/13/23 217 lb 2.5 oz (98.5 kg)  01/07/23 220 lb 9 oz (100 kg)    GEN: Well nourished, well developed in no acute distress. Sitting comfortably in the chair  NECK: No JVD CARDIAC: RRR, no murmurs, rubs, gallops. Radial pulses 2+ bilaterally  RESPIRATORY:  Clear to auscultation without rales, wheezing or rhonchi. Normal work of breathing on room air  ABDOMEN: Soft, non-tender,  non-distended EXTREMITIES:  2+ edema in BLE; No deformity   ASSESSMENT AND [name]: .    HFmrEF  Ankle Edema  - Echocardiogram from 12/14/22 showed EF 45-50%, no regional wall motion abnormalities, grade I DD. Noted that LVEF was mildly reduced with apical motion consistent with RV pacing  - Patient is currently on PO lasix 40 mg daily and potassium chloride 20 meq daily  - He has 2+ ankle edema on exam, but is otherwise euvolemic. Lungs are clear. No DOE, shortness of breath, orthopnea - Instructed patient to wear compression stockings, but he declined. Also offered increasing his dose of lasix for the next 3 days to see if ankle edema improved, but patient declined at this time  - Patient is frustrated with how many medication changes have been made recently, and he does not want to add any medications at this time. Unable to start GDMT - Continue lasix 40 mg daily and potassium supplementation  - Check BMP today to assess renal function and electrolytes on lasix   HTN - BP 138/66 today  in clinic. As above, patient does not want to add new medications at this time  - Continue lasix 40 mg daily, amlodipine 10 mg daily  - Checking BMP as above   CHB s/p PPM  - Most recent device interrogation from 11/2022 showed no new events, normal device function  - Continue remote device checks every 3 months  - Followed by Dr. Ladona [name]   PAF - Patient's PPM detected atrial fibrillation in 10/2020- V rates were controlled  - Most recent device interrogation from 11/2022 showed no new events  - Patient denies palpitations, episodes of fast HR  - Continue eliquis 5 mg BID - denies bleeding issues on eliquis   CAD on CT scan  HLD  - CT scan 09/2019 showed left main and three-vessel atherosclerosis - Patient denies chest pain, DOE   - Not on ASA as patient is on eliquis  - LDL 156 in 12/2022- patient was previously on simvastatin, but this was discontinued for unclear reasons. Patient willing to resume  simvastatin, but wants to talk to his oncologist prior   Pancreatic Cancer  - On chemotherapy    Dispo: 3-4 months   Signed, Jonita Albee, PA-C

## 2023-01-16 ENCOUNTER — Other Ambulatory Visit: Payer: Self-pay | Admitting: Oncology

## 2023-01-17 ENCOUNTER — Inpatient Hospital Stay: Payer: Medicare Other

## 2023-01-17 ENCOUNTER — Other Ambulatory Visit: Payer: Medicare Other

## 2023-01-17 VITALS — BP 126/64 | HR 61 | Temp 97.4°F | Resp 16

## 2023-01-17 DIAGNOSIS — K769 Liver disease, unspecified: Secondary | ICD-10-CM | POA: Diagnosis not present

## 2023-01-17 DIAGNOSIS — C259 Malignant neoplasm of pancreas, unspecified: Secondary | ICD-10-CM

## 2023-01-17 DIAGNOSIS — C250 Malignant neoplasm of head of pancreas: Secondary | ICD-10-CM | POA: Diagnosis not present

## 2023-01-17 DIAGNOSIS — Z5111 Encounter for antineoplastic chemotherapy: Secondary | ICD-10-CM | POA: Diagnosis not present

## 2023-01-17 LAB — CBC WITH DIFFERENTIAL (CANCER CENTER ONLY)
Abs Immature Granulocytes: 0.14 10*3/uL — ABNORMAL HIGH (ref 0.00–0.07)
Basophils Absolute: 0 10*3/uL (ref 0.0–0.1)
Basophils Relative: 1 %
Eosinophils Absolute: 0.2 10*3/uL (ref 0.0–0.5)
Eosinophils Relative: 4 %
HCT: 26.2 % — ABNORMAL LOW (ref 39.0–52.0)
Hemoglobin: 8.4 g/dL — ABNORMAL LOW (ref 13.0–17.0)
Immature Granulocytes: 4 %
Lymphocytes Relative: 29 %
Lymphs Abs: 1.1 10*3/uL (ref 0.7–4.0)
MCH: 29.8 pg (ref 26.0–34.0)
MCHC: 32.1 g/dL (ref 30.0–36.0)
MCV: 92.9 fL (ref 80.0–100.0)
Monocytes Absolute: 0.4 10*3/uL (ref 0.1–1.0)
Monocytes Relative: 12 %
Neutro Abs: 1.9 10*3/uL (ref 1.7–7.7)
Neutrophils Relative %: 50 %
Platelet Count: 255 10*3/uL (ref 150–400)
RBC: 2.82 MIL/uL — ABNORMAL LOW (ref 4.22–5.81)
RDW: 15 % (ref 11.5–15.5)
WBC Count: 3.7 10*3/uL — ABNORMAL LOW (ref 4.0–10.5)
nRBC: 0 % (ref 0.0–0.2)

## 2023-01-17 MED ORDER — SODIUM CHLORIDE 0.9% FLUSH
10.0000 mL | INTRAVENOUS | Status: DC | PRN
  Administered 2023-01-17: 10 mL via INTRAVENOUS

## 2023-01-17 MED ORDER — HEPARIN SOD (PORK) LOCK FLUSH 100 UNIT/ML IV SOLN
500.0000 [IU] | Freq: Once | INTRAVENOUS | Status: AC
  Administered 2023-01-17: 500 [IU] via INTRAVENOUS

## 2023-01-17 NOTE — Progress Notes (Signed)
Reviewed CBS results with Marcus [name] improved. He will F/U as scheduled on 7/19/

## 2023-01-18 ENCOUNTER — Ambulatory Visit: Payer: Medicare Other | Attending: Cardiology | Admitting: Cardiology

## 2023-01-18 ENCOUNTER — Encounter: Payer: Self-pay | Admitting: Cardiology

## 2023-01-18 VITALS — BP 138/66 | HR 61 | Ht 68.0 in | Wt 216.8 lb

## 2023-01-18 DIAGNOSIS — I4430 Unspecified atrioventricular block: Secondary | ICD-10-CM | POA: Diagnosis not present

## 2023-01-18 DIAGNOSIS — I10 Essential (primary) hypertension: Secondary | ICD-10-CM | POA: Diagnosis not present

## 2023-01-18 DIAGNOSIS — Z7901 Long term (current) use of anticoagulants: Secondary | ICD-10-CM

## 2023-01-18 DIAGNOSIS — E782 Mixed hyperlipidemia: Secondary | ICD-10-CM

## 2023-01-18 DIAGNOSIS — I5042 Chronic combined systolic (congestive) and diastolic (congestive) heart failure: Secondary | ICD-10-CM

## 2023-01-18 DIAGNOSIS — I5043 Acute on chronic combined systolic (congestive) and diastolic (congestive) heart failure: Secondary | ICD-10-CM | POA: Diagnosis not present

## 2023-01-18 DIAGNOSIS — I480 Paroxysmal atrial fibrillation: Secondary | ICD-10-CM

## 2023-01-18 DIAGNOSIS — Z950 Presence of cardiac pacemaker: Secondary | ICD-10-CM

## 2023-01-18 NOTE — Patient Instructions (Signed)
Medication Instructions:  The current medical regimen is effective;  continue present plan and medications as directed. Please refer to the Current Medication list given to you today.  *If you need a refill on your cardiac medications before your next appointment, please call your pharmacy*  Lab Work: BMET TODAY If you have labs (blood work) drawn today and your tests are completely normal, you will receive your results only by:  MyChart Message (if you have MyChart) OR  A paper copy in the mail If you have any lab test that is abnormal or we need to change your treatment, we will call you to review the results.  Testing/Procedures: NONE  Follow-Up: At [facility], you and your health needs are our priority.  As part of our continuing mission to provide you with exceptional heart care, we have created designated Provider Care Teams.  These Care Teams include your primary Cardiologist (physician) and Advanced Practice Providers (APPs -  Physician Assistants and Nurse Practitioners) who all work together to provide you with the care you need, when you need it.  Your next appointment:   3-4 month(s)  Provider:   Olga Millers, MD  or  ANY APP         Other Instructions

## 2023-01-19 ENCOUNTER — Encounter: Payer: Self-pay | Admitting: Oncology

## 2023-01-19 LAB — BASIC METABOLIC PANEL
BUN/Creatinine Ratio: 20 (ref 10–24)
BUN: 20 mg/dL (ref 8–27)
CO2: 25 mmol/L (ref 20–29)
Calcium: 9.1 mg/dL (ref 8.6–10.2)
Chloride: 107 mmol/L — ABNORMAL HIGH (ref 96–106)
Creatinine, Ser: 1 mg/dL (ref 0.76–1.27)
Glucose: 96 mg/dL (ref 70–99)
Potassium: 4.9 mmol/L (ref 3.5–5.2)
Sodium: 144 mmol/L (ref 134–144)
eGFR: 75 mL/min/{1.73_m2} (ref >59)

## 2023-01-21 ENCOUNTER — Inpatient Hospital Stay: Payer: Medicare Other

## 2023-01-21 ENCOUNTER — Encounter: Payer: Self-pay | Admitting: Nurse Practitioner

## 2023-01-21 ENCOUNTER — Inpatient Hospital Stay (HOSPITAL_BASED_OUTPATIENT_CLINIC_OR_DEPARTMENT_OTHER): Payer: Medicare Other | Admitting: Nurse Practitioner

## 2023-01-21 VITALS — BP 150/70 | HR 60

## 2023-01-21 VITALS — BP 156/68 | HR 60 | Temp 98.1°F | Resp 18 | Ht 68.0 in | Wt 215.0 lb

## 2023-01-21 DIAGNOSIS — C259 Malignant neoplasm of pancreas, unspecified: Secondary | ICD-10-CM

## 2023-01-21 DIAGNOSIS — C250 Malignant neoplasm of head of pancreas: Secondary | ICD-10-CM | POA: Diagnosis not present

## 2023-01-21 DIAGNOSIS — Z95828 Presence of other vascular implants and grafts: Secondary | ICD-10-CM

## 2023-01-21 DIAGNOSIS — K769 Liver disease, unspecified: Secondary | ICD-10-CM | POA: Diagnosis not present

## 2023-01-21 DIAGNOSIS — Z5111 Encounter for antineoplastic chemotherapy: Secondary | ICD-10-CM | POA: Diagnosis not present

## 2023-01-21 LAB — CBC WITH DIFFERENTIAL (CANCER CENTER ONLY)
Abs Immature Granulocytes: 0.1 10*3/uL — ABNORMAL HIGH (ref 0.00–0.07)
Basophils Absolute: 0 10*3/uL (ref 0.0–0.1)
Basophils Relative: 1 %
Eosinophils Absolute: 0.2 10*3/uL (ref 0.0–0.5)
Eosinophils Relative: 5 %
HCT: 29.2 % — ABNORMAL LOW (ref 39.0–52.0)
Hemoglobin: 9.1 g/dL — ABNORMAL LOW (ref 13.0–17.0)
Immature Granulocytes: 2 %
Lymphocytes Relative: 27 %
Lymphs Abs: 1.2 10*3/uL (ref 0.7–4.0)
MCH: 29.9 pg (ref 26.0–34.0)
MCHC: 31.2 g/dL (ref 30.0–36.0)
MCV: 96.1 fL (ref 80.0–100.0)
Monocytes Absolute: 0.5 10*3/uL (ref 0.1–1.0)
Monocytes Relative: 11 %
Neutro Abs: 2.5 10*3/uL (ref 1.7–7.7)
Neutrophils Relative %: 54 %
Platelet Count: 400 10*3/uL (ref 150–400)
RBC: 3.04 MIL/uL — ABNORMAL LOW (ref 4.22–5.81)
RDW: 15.6 % — ABNORMAL HIGH (ref 11.5–15.5)
WBC Count: 4.5 10*3/uL (ref 4.0–10.5)
nRBC: 0 % (ref 0.0–0.2)

## 2023-01-21 LAB — CMP (CANCER CENTER ONLY)
ALT: 21 U/L (ref 0–44)
AST: 26 U/L (ref 15–41)
Albumin: 3.6 g/dL (ref 3.5–5.0)
Alkaline Phosphatase: 160 U/L — ABNORMAL HIGH (ref 38–126)
Anion gap: 7 (ref 5–15)
BUN: 20 mg/dL (ref 8–23)
CO2: 26 mmol/L (ref 22–32)
Calcium: 9.1 mg/dL (ref 8.9–10.3)
Chloride: 109 mmol/L (ref 98–111)
Creatinine: 0.94 mg/dL (ref 0.61–1.24)
GFR, Estimated: >60 mL/min (ref >60)
Glucose, Bld: 131 mg/dL — ABNORMAL HIGH (ref 70–99)
Potassium: 4.4 mmol/L (ref 3.5–5.1)
Sodium: 142 mmol/L (ref 135–145)
Total Bilirubin: 0.7 mg/dL (ref 0.3–1.2)
Total Protein: 6.3 g/dL — ABNORMAL LOW (ref 6.5–8.1)

## 2023-01-21 MED ORDER — PROCHLORPERAZINE MALEATE 10 MG PO TABS
10.0000 mg | ORAL_TABLET | Freq: Once | ORAL | Status: AC
  Administered 2023-01-21: 10 mg via ORAL

## 2023-01-21 MED ORDER — SODIUM CHLORIDE 0.9% FLUSH
10.0000 mL | INTRAVENOUS | Status: DC | PRN
  Administered 2023-01-21: 10 mL

## 2023-01-21 MED ORDER — SODIUM CHLORIDE 0.9 % IV SOLN: Freq: Once | INTRAVENOUS | Status: AC

## 2023-01-21 MED ORDER — SODIUM CHLORIDE 0.9 % IV SOLN
800.0000 mg/m2 | Freq: Once | INTRAVENOUS | Status: AC
  Administered 2023-01-21: 1710 mg via INTRAVENOUS
  Filled 2023-01-21: qty 26.28

## 2023-01-21 MED ORDER — PACLITAXEL PROTEIN-BOUND CHEMO INJECTION 100 MG
100.0000 mg/m2 | Freq: Once | INTRAVENOUS | Status: AC
  Administered 2023-01-21: 200 mg via INTRAVENOUS
  Filled 2023-01-21: qty 40

## 2023-01-21 MED ORDER — HEPARIN SOD (PORK) LOCK FLUSH 100 UNIT/ML IV SOLN
500.0000 [IU] | Freq: Once | INTRAVENOUS | Status: AC | PRN
  Administered 2023-01-21: 500 [IU]

## 2023-01-21 MED ORDER — ALTEPLASE 2 MG IJ SOLR
2.0000 mg | Freq: Once | INTRAMUSCULAR | Status: AC
  Administered 2023-01-21: 2 mg

## 2023-01-21 NOTE — Progress Notes (Unsigned)
  [name] OFFICE PROGRESS NOTE   Diagnosis:  Pancreas cancer  INTERVAL HISTORY:   Marcus [name] returns for f/u. He completed cycle 1 gemcitabine/abraxane 01/07/2023. He was admitted with pneumonia and acute CHF exacerbation 01/09/2023-01/13/2023. Discharged home on Augmentin x 3 days.   Denies shortness of breath and cough. Noted a single mouth sore a few days ago. Denies nausea, diarrhea. No rash. No further fever. No numbness or tingling in the hands or feet.   Objective:  Vital signs in last 24 hours:  Blood pressure (!) 159/60, pulse 60, temperature 98.1 F (36.7 C), temperature source Oral, resp. rate 18, height 5\' 8"  (1.727 m), weight 215 lb (97.5 kg), SpO2 100%.    HEENT: no thrush or ulcers Resp: Decreased breath sounds lower lung fields. No respiratory distress.  Cardio: Regular GI: abdomen soft and nontender. No hepatomegaly. Vascular: trace lower leg edema bilaterally Portacath without erythema.  Lab Results:  Lab Results  Component Value Date   WBC 4.5 01/21/2023   HGB 9.1 (L) 01/21/2023   HCT 29.2 (L) 01/21/2023   MCV 96.1 01/21/2023   PLT 400 01/21/2023   NEUTROABS 2.5 01/21/2023    Imaging:  No results found.  Medications: I have reviewed the patient's current medications.  Assessment/Plan: Pancreas cancer CT abdomen/pelvis 12/03/2022-suspected pancreas mass in the proximal body measuring 2 cm with probable mass effect on the superior aspect of the SMV, multiple hypodense liver lesions, many are new from a remote CT-indeterminate, 2 cystic lesions in the pancreas tail-nonspecific prior bariatric surgery ERCP 12/13/2022-malignant appearing common bile duct stricture, plastic stent placed, brushing cytology-no malignant cells EUS 12/23/2022-30 x 33 mm pancreas head mass with invasion of the SMV, no malignant appearing lymph nodes, 4 cysts in the visualized liver, FNA-adenocarcinoma Cycle 1 gemcitabine and abraxane 01/07/2023 Cycle 2 gemcitabine  and abraxane 01/21/2023, gemcitabine dose reduced due to neutropenia Biliary obstruction secondary to 1-status post ERCP/stent placement 12/13/2022 Coronary artery disease Atrial fibrillation Gout Sleep apnea Status post bariatric surgery Hypertension Bronchiectasis Gastroesophageal reflux disease History of colon cancer in his 40s Complete heart block-pacemaker placed Admission 01/09/2023 with a fever and failure to thrive, CT chest with evidence of left lung pneumonia, CT abdomen/pelvis with a distended gallbladder and adjacent fat stranding Anemia  Disposition: Marcus [name] has completed 1 cycle of gemcitabine/abraxane.  He developed a fever and was hospitalized with pneumonia 01/09/2023. The fever may have been related to Gemcitabine. He appears stable to proceed with cycle 2 today as scheduled. Gemcitabine will be dose reduced due to mild neutropenia following cycle 1.  CBC reviewed. Counts adequate for treatment. Chem panel is pending.  He will return for f/u and treatment in 2 weeks. He will contact the office in the interim with any problems.   Patient seen with Dr Dominga Ferry ANP/GNP-BC   01/21/2023  11:07 AM  This was a shared visit with Lonna Cobb.  Marcus [name] has recovered from the admission with fever and dyspnea.  The fever may have been related to gemcitabine. The neutrophil count has recovered.  Gemcitabine will be dose reduced with chemotherapy today.  I was present for greater than 50% of todays visit. The majority of the time was used for counseling and coordination of care.  Mancel Bale, MD

## 2023-01-21 NOTE — Patient Instructions (Signed)
[facility]   Discharge Instructions: Thank you for choosing [facility] to provide your oncology and hematology care.   If you have a lab appointment with the Cancer Center, please go directly to the Cancer Center and check in at the registration area.   Wear comfortable clothing and clothing appropriate for easy access to any Portacath or PICC line.   We strive to give you quality time with your provider. You may need to reschedule your appointment if you arrive late (15 or more minutes).  Arriving late affects you and other patients whose appointments are after yours.  Also, if you miss three or more appointments without notifying the office, you may be dismissed from the clinic at the provider's discretion.      For prescription refill requests, have your pharmacy contact our office and allow 72 hours for refills to be completed.    Today you received the following chemotherapy and/or immunotherapy agents Paclitaxel-protein bound (ABRAXANE) & Gemcitabine (GEMZAR).   To help prevent nausea and vomiting after your treatment, we encourage you to take your nausea medication as directed.  BELOW ARE SYMPTOMS THAT SHOULD BE REPORTED IMMEDIATELY: *FEVER GREATER THAN 100.4 F (38 C) OR HIGHER *CHILLS OR SWEATING *NAUSEA AND VOMITING THAT IS NOT CONTROLLED WITH YOUR NAUSEA MEDICATION *UNUSUAL SHORTNESS OF BREATH *UNUSUAL BRUISING OR BLEEDING *URINARY PROBLEMS (pain or burning when urinating, or frequent urination) *BOWEL PROBLEMS (unusual diarrhea, constipation, pain near the anus) TENDERNESS IN MOUTH AND THROAT WITH OR WITHOUT PRESENCE OF ULCERS (sore throat, sores in mouth, or a toothache) UNUSUAL RASH, SWELLING OR PAIN  UNUSUAL VAGINAL DISCHARGE OR ITCHING   Items with * indicate a potential emergency and should be followed up as soon as possible or go to the Emergency Department if any problems should occur.  Please show the CHEMOTHERAPY  ALERT CARD or IMMUNOTHERAPY ALERT CARD at check-in to the Emergency Department and triage nurse.  Should you have questions after your visit or need to cancel or reschedule your appointment, please contact [facility]  Dept: [phone]  and follow the prompts.  Office hours are 8:00 a.m. to 4:30 p.m. Monday - Friday. Please note that voicemails left after 4:00 p.m. may not be returned until the following business day.  We are closed weekends and major holidays. You have access to a nurse at all times for urgent questions. Please call the main number to the clinic Dept: [phone] and follow the prompts.   For any non-urgent questions, you may also contact your provider using MyChart. We now offer e-Visits for anyone 18 and older to request care online for non-urgent symptoms. For details visit [email or web address].   Also download the MyChart app! Go to the app store, search "MyChart", open the app, select [facility], and log in with your MyChart username and password.  Paclitaxel Nanoparticle Albumin-Bound Injection What is this medication? NANOPARTICLE ALBUMIN-BOUND PACLITAXEL (Na no PAHR ti kuhl al BYOO muhn-bound PAK li TAX el) treats some types of cancer. It works by slowing down the growth of cancer cells. This medicine may be used for other purposes; ask your health care provider or pharmacist if you have questions. COMMON BRAND NAME(S): Abraxane What should I tell my care team before I take this medication? They need to know if you have any of these conditions: Liver disease Low white blood cell levels An unusual or allergic reaction to paclitaxel, albumin, other medications, foods, dyes, or preservatives   If you or your partner are pregnant or trying to get pregnant Breast-feeding How should I use this medication? This medication is injected into a vein. It is given by your care team in a hospital or clinic setting. Talk to your care team  about the use of this medication in children. Special care may be needed. Overdosage: If you think you have taken too much of this medicine contact a poison control center or emergency room at once. NOTE: This medicine is only for you. Do not share this medicine with others. What if I miss a dose? Keep appointments for follow-up doses. It is important not to miss your dose. Call your care team if you are unable to keep an appointment. What may interact with this medication? Other medications may affect the way this medication works. Talk with your care team about all of the medications you take. They may suggest changes to your treatment plan to lower the risk of side effects and to make sure your medications work as intended. This list may not describe all possible interactions. Give your health care provider a list of all the medicines, herbs, non-prescription drugs, or dietary supplements you use. Also tell them if you smoke, drink alcohol, or use illegal drugs. Some items may interact with your medicine. What should I watch for while using this medication? Your condition will be monitored carefully while you are receiving this medication. You may need blood work while taking this medication. This medication may make you feel generally unwell. This is not uncommon as chemotherapy can affect healthy cells as well as cancer cells. Report any side effects. Continue your course of treatment even though you feel ill unless your care team tells you to stop. This medication can cause serious allergic reactions. To reduce the risk, your care team may give you other medications to take before receiving this one. Be sure to follow the directions from your care team. This medication may increase your risk of getting an infection. Call your care team for advice if you get a fever, chills, sore throat, or other symptoms of a cold or flu. Do not treat yourself. Try to avoid being around people who are sick. This  medication may increase your risk to bruise or bleed. Call your care team if you notice any unusual bleeding. Be careful brushing or flossing your teeth or using a toothpick because you may get an infection or bleed more easily. If you have any dental work done, tell your dentist you are receiving this medication. Talk to your care team if you or your partner may be pregnant. Serious birth defects can occur if you take this medication during pregnancy and for 6 months after the last dose. You will need a negative pregnancy test before starting this medication. Contraception is recommended while taking this medication and for 6 months after the last dose. Your care team can help you find the option that works for you. If your partner can get pregnant, use a condom during sex while taking this medication and for 3 months after the last dose. Do not breastfeed while taking this medication and for 2 weeks after the last dose. This medication may cause infertility. Talk to your care team if you are concerned about your fertility. What side effects may I notice from receiving this medication? Side effects that you should report to your care team as soon as possible: Allergic reactions--skin rash, itching, hives, swelling of the face, lips, tongue, or throat Dry cough,   shortness of breath or trouble breathing Infection--fever, chills, cough, sore throat, wounds that don't heal, pain or trouble when passing urine, general feeling of discomfort or being unwell Low red blood cell level--unusual weakness or fatigue, dizziness, headache, trouble breathing Pain, tingling, or numbness in the hands or feet Stomach pain, unusual weakness or fatigue, nausea, vomiting, diarrhea, or fever that lasts longer than expected Unusual bruising or bleeding Side effects that usually do not require medical attention (report to your care team if they continue or are bothersome): Diarrhea Fatigue Hair loss Loss of  appetite Nausea Vomiting This list may not describe all possible side effects. Call your doctor for medical advice about side effects. You may report side effects to FDA at 1-800-FDA-1088. Where should I keep my medication? This medication is given in a hospital or clinic. It will not be stored at home. NOTE: This sheet is a summary. It may not cover all possible information. If you have questions about this medicine, talk to your doctor, pharmacist, or health care provider.  2024 Elsevier/Gold Standard (2021-11-05 00:00:00)  Gemcitabine Injection What is this medication? GEMCITABINE (jem SYE ta been) treats some types of cancer. It works by slowing down the growth of cancer cells. This medicine may be used for other purposes; ask your health care provider or pharmacist if you have questions. COMMON BRAND NAME(S): Gemzar, Infugem What should I tell my care team before I take this medication? They need to know if you have any of these conditions: Blood disorders Infection Kidney disease Liver disease Lung or breathing disease, such as asthma or COPD Recent or ongoing radiation therapy An unusual or allergic reaction to gemcitabine, other medications, foods, dyes, or preservatives If you or your partner are pregnant or trying to get pregnant Breast-feeding How should I use this medication? This medication is injected into a vein. It is given by your care team in a hospital or clinic setting. Talk to your care team about the use of this medication in children. Special care may be needed. Overdosage: If you think you have taken too much of this medicine contact a poison control center or emergency room at once. NOTE: This medicine is only for you. Do not share this medicine with others. What if I miss a dose? Keep appointments for follow-up doses. It is important not to miss your dose. Call your care team if you are unable to keep an appointment. What may interact with this  medication? Interactions have not been studied. This list may not describe all possible interactions. Give your health care provider a list of all the medicines, herbs, non-prescription drugs, or dietary supplements you use. Also tell them if you smoke, drink alcohol, or use illegal drugs. Some items may interact with your medicine. What should I watch for while using this medication? Your condition will be monitored carefully while you are receiving this medication. This medication may make you feel generally unwell. This is not uncommon, as chemotherapy can affect healthy cells as well as cancer cells. Report any side effects. Continue your course of treatment even though you feel ill unless your care team tells you to stop. In some cases, you may be given additional medications to help with side effects. Follow all directions for their use. This medication may increase your risk of getting an infection. Call your care team for advice if you get a fever, chills, sore throat, or other symptoms of a cold or flu. Do not treat yourself. Try to avoid being around   people who are sick. This medication may increase your risk to bruise or bleed. Call your care team if you notice any unusual bleeding. Be careful brushing or flossing your teeth or using a toothpick because you may get an infection or bleed more easily. If you have any dental work done, tell your dentist you are receiving this medication. Avoid taking medications that contain aspirin, acetaminophen, ibuprofen, naproxen, or ketoprofen unless instructed by your care team. These medications may hide a fever. Talk to your care team if you or your partner wish to become pregnant or think you might be pregnant. This medication can cause serious birth defects if taken during pregnancy and for 6 months after the last dose. A negative pregnancy test is required before starting this medication. A reliable form of contraception is recommended while taking  this medication and for 6 months after the last dose. Talk to your care team about effective forms of contraception. Do not father a child while taking this medication and for 3 months after the last dose. Use a condom while having sex during this time period. Do not breastfeed while taking this medication and for at least 1 week after the last dose. This medication may cause infertility. Talk to your care team if you are concerned about your fertility. What side effects may I notice from receiving this medication? Side effects that you should report to your care team as soon as possible: Allergic reactions--skin rash, itching, hives, swelling of the face, lips, tongue, or throat Capillary leak syndrome--stomach or muscle pain, unusual weakness or fatigue, feeling faint or lightheaded, decrease in the amount of urine, swelling of the ankles, hands, or feet, trouble breathing Infection--fever, chills, cough, sore throat, wounds that don't heal, pain or trouble when passing urine, general feeling of discomfort or being unwell Liver injury--right upper belly pain, loss of appetite, nausea, light-colored stool, dark yellow or brown urine, yellowing skin or eyes, unusual weakness or fatigue Low red blood cell level--unusual weakness or fatigue, dizziness, headache, trouble breathing Lung injury--shortness of breath or trouble breathing, cough, spitting up blood, chest pain, fever Stomach pain, bloody diarrhea, pale skin, unusual weakness or fatigue, decrease in the amount of urine, which may be signs of hemolytic uremic syndrome Sudden and severe headache, confusion, change in vision, seizures, which may be signs of posterior reversible encephalopathy syndrome (PRES) Unusual bruising or bleeding Side effects that usually do not require medical attention (report to your care team if they continue or are bothersome): Diarrhea Drowsiness Hair loss Nausea Pain, redness, or swelling with sores inside the  mouth or throat Vomiting This list may not describe all possible side effects. Call your doctor for medical advice about side effects. You may report side effects to FDA at 1-800-FDA-1088. Where should I keep my medication? This medication is given in a hospital or clinic. It will not be stored at home. NOTE: This sheet is a summary. It may not cover all possible information. If you have questions about this medicine, talk to your doctor, pharmacist, or health care provider.  2024 Elsevier/Gold Standard (2021-10-27 00:00:00)  

## 2023-01-21 NOTE — Progress Notes (Unsigned)
Patient seen by Lonna Cobb NP today  Vitals are within treatment parameters.  Labs reviewed by Lonna Cobb NP and are within treatment parameters.  Per physician team, patient is ready for treatment. Please note that modifications are being made to the treatment plan including Gemzar dose reduce.

## 2023-01-21 NOTE — Patient Instructions (Signed)

## 2023-01-22 ENCOUNTER — Encounter: Payer: Self-pay | Admitting: Oncology

## 2023-01-25 ENCOUNTER — Ambulatory Visit (INDEPENDENT_AMBULATORY_CARE_PROVIDER_SITE_OTHER): Payer: Medicare Other | Admitting: Adult Health

## 2023-01-25 ENCOUNTER — Encounter: Payer: Self-pay | Admitting: Adult Health

## 2023-01-25 VITALS — BP 120/62 | HR 64 | Temp 98.2°F | Ht 68.0 in | Wt 213.0 lb

## 2023-01-25 DIAGNOSIS — E876 Hypokalemia: Secondary | ICD-10-CM | POA: Diagnosis not present

## 2023-01-25 DIAGNOSIS — C250 Malignant neoplasm of head of pancreas: Secondary | ICD-10-CM

## 2023-01-25 DIAGNOSIS — K810 Acute cholecystitis: Secondary | ICD-10-CM

## 2023-01-25 DIAGNOSIS — D61818 Other pancytopenia: Secondary | ICD-10-CM

## 2023-01-25 DIAGNOSIS — F5101 Primary insomnia: Secondary | ICD-10-CM

## 2023-01-25 DIAGNOSIS — E8809 Other disorders of plasma-protein metabolism, not elsewhere classified: Secondary | ICD-10-CM | POA: Diagnosis not present

## 2023-01-25 DIAGNOSIS — R7989 Other specified abnormal findings of blood chemistry: Secondary | ICD-10-CM

## 2023-01-25 DIAGNOSIS — A419 Sepsis, unspecified organism: Secondary | ICD-10-CM

## 2023-01-25 DIAGNOSIS — J441 Chronic obstructive pulmonary disease with (acute) exacerbation: Secondary | ICD-10-CM | POA: Diagnosis not present

## 2023-01-25 DIAGNOSIS — I10 Essential (primary) hypertension: Secondary | ICD-10-CM | POA: Diagnosis not present

## 2023-01-25 DIAGNOSIS — R3912 Poor urinary stream: Secondary | ICD-10-CM

## 2023-01-25 DIAGNOSIS — N401 Enlarged prostate with lower urinary tract symptoms: Secondary | ICD-10-CM

## 2023-01-25 DIAGNOSIS — R300 Dysuria: Secondary | ICD-10-CM

## 2023-01-25 DIAGNOSIS — Z8739 Personal history of other diseases of the musculoskeletal system and connective tissue: Secondary | ICD-10-CM

## 2023-01-25 DIAGNOSIS — I5043 Acute on chronic combined systolic (congestive) and diastolic (congestive) heart failure: Secondary | ICD-10-CM

## 2023-01-25 DIAGNOSIS — I482 Chronic atrial fibrillation, unspecified: Secondary | ICD-10-CM | POA: Diagnosis not present

## 2023-01-25 MED ORDER — ESZOPICLONE 3 MG PO TABS
3.0000 mg | ORAL_TABLET | Freq: Every day | ORAL | 2 refills | Status: DC
Start: 2023-01-25 — End: 2023-02-23

## 2023-01-25 NOTE — Progress Notes (Addendum)
Subjective:    Patient ID: Marcus [name], male    DOB: Sep 15, 1940, 82 y.o.   MRN: [id number]  HPI 82 year old male who  has a past medical history of Acute meniscal tear of knee (left), Arthritis, AV block (08/01/2018), Benign essential hypertension (01/18/2007), Bronchiectasis with acute exacerbation (06/23/2020), Chest pain, atypical (08/23/2014), Chronic cough (10/07/2014), Coronary artery disease, Degeneration of lumbar intervertebral disc (10/14/2017), Diverticulosis of colon (07/07/2005), Elevated PSA (06/19/2014), GERD (gastroesophageal reflux disease) (01/18/2007), Gout, unspecified (01/18/2007), Hemorrhoids (01/19/2011), Hiatal hernia, History of colonic polyps (01/18/2007), Insomnia, unspecified (08/21/2007), Iron deficiency anemia, unspecified  (01/19/2011), Lumbar post-laminectomy syndrome (10/14/2017), Lumbar spondylolysis, Lumbar strain (12/14/2011), Obstructive sleep apnea (01/18/2007), Paraesophageal hernia, Primary osteoarthritis of knee (05/23/2015), Prostate cancer (HCC) (2016), S/P knee replacement (05/23/2015), Sensorineural hearing loss, bilateral, Vitamin B12 deficiency, and Vitamin D deficiency (10/28/2009).  He presents to the office today for TCM visit  Admit Date 01/09/2023 Discharge date 01/13/2023  He presented to the emergency room complaining of fever, chills, shortness of breath, sputum production and generalized fatigue for 3 days.  Hospital Course   Sepsis secondary from CAP -On initial presentation the patient was febrile at 102.9 degrees, blood pressure 129/49 and heart rate 84.  WBC 5.  Patient has active cancer currently on chemotherapy leukocytosis is not evident. -Chest x-ray showed evidence of COPD -CTA chest showed small bilateral pleural effusion, acute appearing heterogeneous airspace opacity of the superior segment of the left lobe consistent with infection/aspiration -Diagnosed with sepsis secondary in the setting of pneumonia and acute  cholecystitis. -Physical exam showed evidence of lower extremity pitting edema 2+ an echo showed reduced EF less than 45%, there was concern for CHF exacerbation as well. -Continued on broad-spectrum antibiotic vancomycin and cefepime per pharmacy protocol.  Continued on azithromycin for atypical coverage. -Urine strep pneumonia was negative -Pete chest x-ray done prior to discharge showed cardiomegaly with prominent bilateral interstitial opacities which could represent pulmonary venous congestion or bronchovascular crowding in the setting of low lung volumes.  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 -PT/OT evaluation ordered and recommended home health  History of Hypertension  -Patient is on amlodipine 10 mg at home.  In the hospital blood pressure was borderline soft in the setting of sepsis.  He also has CHF exacerbation at the same time. -Continuing Lasix 40 mg IV daily for CHF exacerbation in the hospital and discharged on 40 mg p.o. Lasix  COPD exacerbation -Reported history of COPD but not on any treatment at home? -Chest xray suggestive of COPD  -Patient reported worsening shortness of breath and mucoid sputum production.  Can be COPD or CHF exacerbation or combination of both.  Also pneumonia can exacerbate the COPD as well. -Started on DuoNeb every 6 hours scheduled and Xopenex as needed. -Started on azithromycin 500 mg daily for 5 days to control inflammation for COPD exacerbation and  also covering atypical for CAP -Continued on supplemental oxygen with O2 sat above 92% -Repeat  chest x-ray in 3 to 6 weeks as outpatient  Acute Cholecystitis/Biliary stenosis status post stent placement  -CT abdomen pelvis showed distended gallbladder with mild gallbladder wall thickening and adjacent pericholecystic fat stranding.  Sludge in the gallbladder fundus.  Findings were concerning for acute cholecystitis -ReSound showed similar findings -Patient has new diagnosis of pancreatic head adeno carcinoma as mentioned above and recent hospital admission showed biliary duct stenosis status post stent placement on 12/15/2022 -Consulted with GI and they evaluated and recommended no repeat ERCP at this time and recommend continued follow-up outpatient ERCP and stent exchange on 02/07/2023 with Dr. Meridee Score.  -The scan did show some filling of the gallbladder and stent appeared patent and presentation was not consistent with acute cholecystitis, GI felt that this presentation is very unlikely that is related to biliary infection -The I recommend resuming oral anticoagulation at this time  Pancreatic head adenocarcinoma  -diagnosed on 12/29/2022 and first chemotherapy on 01/08/2023.  Elevated Troponin  -High sensitive troponin slightly elevated at 19.  He denied chest pain.  EKG showed sinus rhythm with premature atrial complexes and nonspecific T wave abnormalities. -Troponins were trended in the hospital and returned to normal   Chronic Atrial Fibrillation  -Patient was last discharged from the hospital on 12/22/2022 with the plan to hold Eliquis until ERCP then resume anticoagulation, however patient reported that he never resumed his Eliquis as his oncologist recommended to hold it given he is going to have chemotherapy and may develop pancytopenia. [facility] team did reach out to on-call cardiology, given his CHA2DS2-VASC score is 3 he should be on anticoagulation   Hypokalemia  -Replete with  p.o. potassium chloride 40 mEq twice daily. -Potassium level trended up and was within normal limits upon discharge.  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 -He was initially on IV fluids per sepsis management but the same time he had a CHF exacerbation as well.  The fluids were held and was getting IV diuretics. -Avoid nephrotoxic medications, contrast dyes, hypotension, and dehydration to ensure adequate renal perfusion  History of Gout  - Continue Allopurinol 300 mg daily  BPH  - Resumed home Flomax 0.4 mg daily   GERD/GI propylaxis  - Continue Protonix 40 mg po daily   Dysuria with concerns for UTI and mild Foley Trauma  -Continue Pyridium for ongoing pain -Continue current antibiotics IV cefepime and oral azithromycin while hospitalized. -Analysis done and showed a hazy appearance with amber color urine, moderate hemoglobin, moderate leukocytes, positive nitrates, few bacteria, present mucus, greater than 50 RBCs per high-power field greater than 50 WBCs. -Culture showing no growth and unclear if he is already on antibiotics when it was obtained  Pancytopenia  - in the setting of recent chemotherapy  - Follow up with oncology as outpatient   Hypoalbuminermia  - Continue to monitor   Obesity  -Work on weight loss measures.  Today he reports that he has been feeling better at home since being discharged from the hospital. He has not had any fevers or chills, wheezing or shortness of breath. He continues to have a productive cough from time to time. He is frustrated about all the appointments he has coming up. His biggest concern today is that of insomnia. In the past he was on Lunesta and did well with the 3 mg dose, it was d/c over the last year as he reported feeling as though  he was sleeping well on the CPAP. He would like to go back on this medication, he reports he is not sleeping for more  than 2 hours at a time, he will often lay in bed and watch TV when he wakes up and cannot go back to sleep.    Review of Systems  Constitutional: Negative.   HENT: Negative.    Eyes: Negative.   Respiratory:  Positive for cough.   Cardiovascular: Negative.   Gastrointestinal: Negative.   Endocrine: Negative.   Genitourinary: Negative.   Musculoskeletal: Negative.   Skin: Negative.   Allergic/Immunologic: Negative.   Neurological: Negative.   Hematological: Negative.   Psychiatric/Behavioral:  Positive for sleep disturbance.   All other systems reviewed and are negative.  Past Medical History:  Diagnosis Date   Acute meniscal tear of knee left   Arthritis    generalized   AV block 08/01/2018   Benign essential hypertension 01/18/2007   Bronchiectasis with acute exacerbation 06/23/2020   Chest pain, atypical 08/23/2014   Chronic cough 10/07/2014   Coronary artery disease    Degeneration of lumbar intervertebral disc 10/14/2017   Diverticulosis of colon 07/07/2005   Elevated PSA 06/19/2014   GERD (gastroesophageal reflux disease) 01/18/2007   Gout, unspecified 01/18/2007   Hemorrhoids 01/19/2011   Hiatal hernia    History of colonic polyps 01/18/2007   Insomnia, unspecified 08/21/2007   Iron deficiency anemia, unspecified  01/19/2011   Lumbar post-laminectomy syndrome 10/14/2017   Lumbar spondylolysis    Lumbar strain 12/14/2011   Obstructive sleep apnea 01/18/2007   uses CPAP nightly   Paraesophageal hernia    large   Primary osteoarthritis of knee 05/23/2015   Prostate cancer (HCC) 2016   S/P knee replacement 05/23/2015   Sensorineural hearing loss, bilateral    Vitamin B12 deficiency    Vitamin D deficiency 10/28/2009    Social History   Socioeconomic History   Marital status: Widowed    Spouse name: Not on file   Number of children: 4   Years of education: 38   Highest education level: Some college, no degree  Occupational History   Occupation:  retired    Associate Professor: [employer]  Tobacco Use   Smoking status: Former    Current packs/day: 0.00    Average packs/day: 2.0 packs/day for 20.0 years (40.0 ttl pk-yrs)    Types: Cigarettes    Start date: 07/05/1954    Quit date: 07/05/1974    Years since quitting: 48.5   Smokeless tobacco: Never  Vaping Use   Vaping status: Never Used  Substance and Sexual Activity   Alcohol use: Not Currently    Alcohol/week: 30.0 standard drinks of alcohol    Types: 30 Standard drinks or equivalent per week    Comment: 4-5 glasses of wine or 2-3 beers nightly   Drug use: No   Sexual activity: Not Currently  Other Topics Concern   Not on file  Social History Narrative   Recently widowed   Former Smoker    Alcohol use-yes 1-2 drinks per day      Occupation: Retired Associate Professor      Originally from [place], [place] - in [place] > 10 yrs as of 2017      Social Determinants of Health   Financial Resource Strain: Low Risk  (07/28/2021)   Overall Financial Resource Strain (CARDIA)    Difficulty of Paying Living Expenses: Not hard at all  Food Insecurity: No Food Insecurity (01/14/2023)   Hunger Vital  Sign    Worried About Programme researcher, broadcasting/film/video in the Last Year: Never true    Ran Out of Food in the Last Year: Never true  Transportation Needs: No Transportation Needs (01/14/2023)   PRAPARE - Administrator, Civil Service (Medical): No    Lack of Transportation (Non-Medical): No  Physical Activity: Inactive (07/28/2021)   Exercise Vital Sign    Days of Exercise per Week: 0 days    Minutes of Exercise per Session: 0 min  Stress: No Stress Concern Present (07/28/2021)   [employer] of Occupational Health - Occupational Stress Questionnaire    Feeling of Stress : Not at all  Social Connections: Moderately Isolated (08/04/2020)   Social Connection and Isolation Panel [NHANES]    Frequency of Communication with Friends and Family: Three times a week    Frequency of Social Gatherings with  Friends and Family: More than three times a week    Attends Religious Services: More than 4 times per year    Active Member of Clubs or Organizations: No    Attends Banker Meetings: Never    Marital Status: Widowed  Intimate Partner Violence: Not At Risk (01/10/2023)   Humiliation, Afraid, Rape, and Kick questionnaire    Fear of Current or Ex-Partner: No    Emotionally Abused: No    Physically Abused: No    Sexually Abused: No    Past Surgical History:  Procedure Laterality Date   BILIARY BRUSHING  12/13/2022   Procedure: BILIARY BRUSHING;  Surgeon: Iva Boop, MD;  Location: [facility] ENDOSCOPY;  Service: Gastroenterology;;   BILIARY STENT PLACEMENT  12/13/2022   Procedure: BILIARY STENT PLACEMENT;  Surgeon: Iva Boop, MD;  Location: [facility] ENDOSCOPY;  Service: Gastroenterology;;   BIOPSY  12/23/2022   Procedure: BIOPSY;  Surgeon: Lemar Lofty., MD;  Location: [facility] ENDOSCOPY;  Service: Gastroenterology;;   BRONCHIAL WASHINGS  10/30/2019   Procedure: BRONCHIAL WASHINGS;  Surgeon: [name], DO;  Location: [facility] ENDOSCOPY;  Service: Endoscopy;;   CARDIAC CATHETERIZATION     CATARACT EXTRACTION W/ INTRAOCULAR LENS  IMPLANT, BILATERAL Bilateral    COLONOSCOPY     ERCP N/A 12/13/2022   Procedure: ENDOSCOPIC RETROGRADE CHOLANGIOPANCREATOGRAPHY (ERCP);  Surgeon: Iva Boop, MD;  Location: [facility] ENDOSCOPY;  Service: Gastroenterology;  Laterality: N/A;  with stent placement   ESOPHAGOGASTRODUODENOSCOPY     ESOPHAGOGASTRODUODENOSCOPY (EGD) WITH PROPOFOL N/A 12/23/2022   Procedure: ESOPHAGOGASTRODUODENOSCOPY (EGD) WITH PROPOFOL;  Surgeon: Meridee Score Netty Starring., MD;  Location: [facility] ENDOSCOPY;  Service: Gastroenterology;  Laterality: N/A;   EUS N/A 12/23/2022   Procedure: UPPER ENDOSCOPIC ULTRASOUND (EUS) RADIAL;  Surgeon: Lemar Lofty., MD;  Location: [facility] ENDOSCOPY;  Service: Gastroenterology;  Laterality: N/A;   FINE NEEDLE ASPIRATION  12/23/2022   Procedure: FINE  NEEDLE ASPIRATION (FNA) RADIAL;  Surgeon: Lemar Lofty., MD;  Location: [facility] ENDOSCOPY;  Service: Gastroenterology;;   IR IMAGING GUIDED PORT INSERTION  01/04/2023   KNEE ARTHROSCOPY Right 2007   KNEE ARTHROSCOPY  07/13/2011   Procedure: ARTHROSCOPY KNEE;  Surgeon: Erasmo Leventhal;  Location: [facility];  Service: Orthopedics;  Laterality: Left;  partial menisectomy with chondrylplasty   KNEE SURGERY Left    LAMINECTOMY AND MICRODISCECTOMY LUMBAR SPINE  MARCH  2008   L3 -  4   LAPAROSCOPIC INCISIONAL / UMBILICAL / VENTRAL HERNIA REPAIR  2006   PACEMAKER IMPLANT N/A 08/02/2018   Procedure: PACEMAKER IMPLANT;  Surgeon: Marinus Maw, MD;  Location: [facility] INVASIVE  CV LAB;  Service: Cardiovascular;  Laterality: N/A;   PROSTATE BIOPSY     RADIOACTIVE SEED IMPLANT N/A 02/12/2019   Procedure: RADIOACTIVE SEED IMPLANT/BRACHYTHERAPY IMPLANT;  Surgeon: Marcine Matar, MD;  Location: [facility] ORS;  Service: Urology;  Laterality: N/A;  90 MINS   SHOULDER ARTHROSCOPY Right 05-23-2007   SIGMOID COLECTOMY FOR CANCER  1989   SPACE OAR INSTILLATION N/A 02/12/2019   Procedure: SPACE OAR INSTILLATION;  Surgeon: Marcine Matar, MD;  Location: [facility] ORS;  Service: Urology;  Laterality: N/A;   SPHINCTEROTOMY  12/13/2022   Procedure: SPHINCTEROTOMY;  Surgeon: Iva Boop, MD;  Location[facility] END[facility]COPY;  Service: Gastroenterology;;   TOTAL KNEE ARTHROPLASTY Left 05/23/2015   Procedure: LEFT TOTAL KNEE ARTHROPLASTY;  Surgeon: Eugenia Mcalpine, MD;  Location: [facility] ORS;  Service: Orthopedics;  Laterality: Left;   VERTICAL BANDED GASTROPLASTY  1986   VIDEO BRONCHOSCOPY N/A 10/30/2019   Procedure: VIDEO BRONCHOSCOPY WITH BRONICAL ALVEROLAR LAVAGE WITHOUT FLUORO;  Surgeon: [name], DO;  Location: [facility] ENDOSCOPY;  Service: Endoscopy;  Laterality: N/A;    Family History  Problem Relation Age of Onset   Stroke Paternal Grandfather    Heart disease Paternal Grandfather    Esophagitis Father         died from perforated esophagus   Breast cancer Mother    Colon cancer Maternal Grandmother        questionable   Esophageal cancer Neg Hx    Prostate cancer Neg Hx    Stomach cancer Neg Hx     Allergies  Allergen Reactions   Contrast Media [Iodinated Contrast Media] Palpitations    TACHYCARDIA- patient states he has tolerated newer agents since this reaction >30 yrs ago    Current Outpatient Medications on File Prior to Visit  Medication Sig Dispense Refill   acetaminophen (TYLENOL) 325 MG tablet Take 2 tablets (650 mg total) by mouth every 6 (six) hours as needed for mild pain (or Fever >/= 101). 20 tablet 0   allopurinol (ZYLOPRIM) 300 MG tablet Take 1 tablet (300 mg total) by mouth daily. 90 tablet 3   amLODipine (NORVASC) 10 MG tablet Take 1 tablet (10 mg total) by mouth daily. 90 tablet 3   amoxicillin-clavulanate (AUGMENTIN) 875-125 MG tablet Take 1 tablet by mouth. TAKE EVERY 12 HOURS     apixaban (ELIQUIS) 5 MG TABS tablet Take 1 tablet (5 mg total) by mouth 2 (two) times daily. 60 tablet 0   Calcium Carb-Cholecalciferol (CALCIUM 600 + D PO) Take 1 tablet by mouth daily. 800 units of vitamin D     esomeprazole (NEXIUM) 40 MG capsule Take 1 capsule (40 mg total) by mouth 2 (two) times daily before a meal. 60 capsule 12   furosemide (LASIX) 40 MG tablet Take 1 tablet (40 mg total) by mouth daily. 30 tablet 0   hydrOXYzine (ATARAX) 50 MG tablet Take 1 tablet (50 mg total) by mouth 3 (three) times daily as needed. 270 tablet 1   lidocaine-prilocaine (EMLA) cream Apply 1 Application topically as needed. 30 g 0   Multiple Vitamin (MULTIVITAMIN) tablet Take 1 tablet by mouth in the morning.     ondansetron (ZOFRAN) 8 MG tablet Take 1 tablet (8 mg total) by mouth every 8 (eight) hours as needed. 20 tablet 0   polyethylene glycol powder (GLYCOLAX/MIRALAX) 17 GM/SCOOP powder Take 17g (1 capful) by mouth as directed daily as needed for mild constipation. 238 g 0   potassium chloride SA  (KLOR-CON M) 20 MEQ tablet Take  1 tablet (20 mEq total) by mouth daily. 30 tablet 0   Respiratory Therapy Supplies (FLUTTER) DEVI 1 each by Does not apply route in the morning and at bedtime. 1 each 0   sodium chloride HYPERTONIC 3 % nebulizer solution Take by nebulization as needed for other. (Patient taking differently: Take 4 mLs by nebulization as needed for other.) 750 mL 12   sucralfate (CARAFATE) 1 GM/10ML suspension Take 10 mLs (1 g total) by mouth 2 (two) times daily. 420 mL 2   tamsulosin (FLOMAX) 0.4 MG CAPS capsule Take 0.4 mg by mouth daily.     No current facility-administered medications on file prior to visit.    BP 120/62   Pulse 64   Temp 98.2 F (36.8 C) (Oral)   Ht 5\' 8"  (1.727 m)   Wt 213 lb (96.6 kg)   SpO2 98%   BMI 32.39 kg/m       Objective:   Physical Exam Vitals and nursing note reviewed.  Cardiovascular:     Rate and Rhythm: Normal rate and regular rhythm.     Pulses: Normal pulses.     Heart sounds: Normal heart sounds.  Pulmonary:     Effort: Pulmonary effort is normal.     Breath sounds: Normal breath sounds.  Abdominal:     General: Abdomen is flat. Bowel sounds are normal.     Palpations: Abdomen is soft.  Musculoskeletal:        General: Normal range of motion.  Skin:    General: Skin is warm and dry.     Capillary Refill: Capillary refill takes less than 2 seconds.  Neurological:     General: No focal deficit present.     Mental Status: He is oriented to person, place, and time.     Motor: Weakness present.     Gait: Gait abnormal.  Psychiatric:        Mood and Affect: Mood normal.        Behavior: Behavior normal.        Thought Content: Thought content normal.        Judgment: Judgment normal.        Assessment & Plan:  1. Sepsis without acute organ dysfunction, due to unspecified organism [facility]) - Reviewed hospital notes, labs, imaging, discharge instructions and medication changes with the patient. All questions answered to  the best of my ability.  - Seems to have resolved. He would like to hold off on repeat lab work and imaging until he has blood work done later this week and imaging upcoming with his specialists.   2. Acute on chronic combined systolic and diastolic congestive heart failure (HCC) - Appears euvolemic today   3. Essential hypertension - Well controlled. No change in medication   4. COPD with acute exacerbation (HCC) - Lungs clear today   5. Acute cholecystitis - Seems to have resolved.   6. Malignant neoplasm of head of pancreas (HCC) - Per GI and Oncology   7. Elevated troponin - resolved prior to D/C  8. Atrial fibrillation, chronic (HCC) - Continue with eliquis and BB  9. Hypokalemia - Will hold off on blood work today due to patient request   10. History of gout - Continue allopurinol   11. Benign prostatic hyperplasia with weak urinary stream - Continue flomax  12. Dysuria - Resolved   13. Pancytopenia (HCC) - d/t chemo. Continue to monitor   14. Hypoalbuminemia - Continue to monitor   15. Primary insomnia -  Will send in Lunesta 3 mg - Eszopiclone 3 MG TABS; Take 1 tablet (3 mg total) by mouth at bedtime. Take immediately before bedtime  Dispense: 30 tablet; Refill: 2   [name], NP

## 2023-01-30 ENCOUNTER — Other Ambulatory Visit: Payer: Self-pay

## 2023-01-30 ENCOUNTER — Emergency Department (HOSPITAL_BASED_OUTPATIENT_CLINIC_OR_DEPARTMENT_OTHER): Payer: Non-veteran care

## 2023-01-30 ENCOUNTER — Other Ambulatory Visit: Payer: Self-pay | Admitting: Oncology

## 2023-01-30 ENCOUNTER — Encounter (HOSPITAL_BASED_OUTPATIENT_CLINIC_OR_DEPARTMENT_OTHER): Payer: Self-pay | Admitting: Emergency Medicine

## 2023-01-30 ENCOUNTER — Emergency Department (HOSPITAL_BASED_OUTPATIENT_CLINIC_OR_DEPARTMENT_OTHER)
Admission: EM | Admit: 2023-01-30 | Discharge: 2023-01-30 | Disposition: A | Discharge status: Home / Self Care | Payer: Non-veteran care | Attending: Emergency Medicine | Admitting: Emergency Medicine

## 2023-01-30 DIAGNOSIS — Z7901 Long term (current) use of anticoagulants: Secondary | ICD-10-CM | POA: Diagnosis not present

## 2023-01-30 DIAGNOSIS — M25521 Pain in right elbow: Secondary | ICD-10-CM | POA: Diagnosis present

## 2023-01-30 DIAGNOSIS — G4700 Insomnia, unspecified: Secondary | ICD-10-CM | POA: Diagnosis not present

## 2023-01-30 DIAGNOSIS — M25421 Effusion, right elbow: Secondary | ICD-10-CM | POA: Diagnosis not present

## 2023-01-30 DIAGNOSIS — J479 Bronchiectasis, uncomplicated: Secondary | ICD-10-CM | POA: Diagnosis not present

## 2023-01-30 DIAGNOSIS — K649 Unspecified hemorrhoids: Secondary | ICD-10-CM | POA: Diagnosis not present

## 2023-01-30 DIAGNOSIS — Z8507 Personal history of malignant neoplasm of pancreas: Secondary | ICD-10-CM | POA: Diagnosis not present

## 2023-01-30 NOTE — Discharge Instructions (Signed)
1.  At this time I suspect you have a resolving hematoma or a traumatic bursitis.  There is no warmth or redness at the joint and you do not have pain with range of motion of the joint.  I do not suspect an infected joint at this point.  Your x-rays do not show any fractures. 2.  Elevate your arm above the level of your heart is much as possible to help the swelling go down.  You may still apply well wrapped ice packs.  Take extra strength Tylenol every 6 hours if needed for pain control. 3.  Follow-up with your orthopedic provider in the next 3 to 7 days. 4.  Return to the emergency department if your elbow becomes painful to move, you start developing redness or warmth in the area of swelling, you get a fever or other concerning changes.

## 2023-01-30 NOTE — ED Provider Notes (Signed)
[place] [facility] DE[facility]MENT AT [facility] Provider Note   CSN: [id number] Arrival date & time: 01/30/23  4098     History  No chief complaint on file.   Marcus [name] is a 82 y.o. male.  HPI Patient fell approximately 1 week ago and hit his right elbow.  He reports at the time it was not significantly painful or swollen.  However over the course of the week the swelling has increased and there is more discomfort.  He does take Eliquis.  He has not had fevers or chills.  No other associated constitutional symptoms.  Patient does have history of pancreatic cancer and is receiving IV chemotherapy but reports these medical conditions are stable at this time.  He has not been doing any elevating or icing for the injury.    Home Medications Prior to Admission medications   Medication Sig Start Date End Date Taking? Authorizing Provider  acetaminophen (TYLENOL) 325 MG tablet Take 2 tablets (650 mg total) by mouth every 6 (six) hours as needed for mild pain (or Fever >/= 101). 01/13/23   Sheikh, Omair Latif, DO  allopurinol (ZYLOPRIM) 300 MG tablet Take 1 tablet (300 mg total) by mouth daily. 05/25/22   Nafziger, Kandee Keen, NP  amLODipine (NORVASC) 10 MG tablet Take 1 tablet (10 mg total) by mouth daily. 05/25/22   Nafziger, Kandee Keen, NP  amoxicillin-clavulanate (AUGMENTIN) 875-125 MG tablet Take 1 tablet by mouth. TAKE EVERY 12 HOURS    [provider]  apixaban (ELIQUIS) 5 MG TABS tablet Take 1 tablet (5 mg total) by mouth 2 (two) times daily. 12/25/22   Mansouraty, Netty Starring., MD  Calcium Carb-Cholecalciferol (CALCIUM 600 + D PO) Take 1 tablet by mouth daily. 800 units of vitamin D    [provider]  esomeprazole (NEXIUM) 40 MG capsule Take 1 capsule (40 mg total) by mouth 2 (two) times daily before a meal. 12/23/22 12/23/23  Mansouraty, Netty Starring., MD  Eszopiclone 3 MG TABS Take 1 tablet (3 mg total) by mouth at bedtime. Take immediately before bedtime 01/25/23    Nafziger, Kandee Keen, NP  furosemide (LASIX) 40 MG tablet Take 1 tablet (40 mg total) by mouth daily. 01/14/23   Marguerita Merles Latif, DO  hydrOXYzine (ATARAX) 50 MG tablet Take 1 tablet (50 mg total) by mouth 3 (three) times daily as needed. 12/23/22 03/23/23  Nafziger, Kandee Keen, NP  lidocaine-prilocaine (EMLA) cream Apply 1 Application topically as needed. 12/31/22   [name], MD  Multiple Vitamin (MULTIVITAMIN) tablet Take 1 tablet by mouth in the morning.    [provider]  ondansetron (ZOFRAN) 8 MG tablet Take 1 tablet (8 mg total) by mouth every 8 (eight) hours as needed. 12/31/22   [name], MD  polyethylene glycol powder (GLYCOLAX/MIRALAX) 17 GM/SCOOP powder Take 17g (1 capful) by mouth as directed daily as needed for mild constipation. 12/15/22   Rolly Salter, MD  potassium chloride SA (KLOR-CON M) 20 MEQ tablet Take 1 tablet (20 mEq total) by mouth daily. 01/13/23   Merlene Laughter, DO  Respiratory Therapy Supplies (FLUTTER) DEVI 1 each by Does not apply route in the morning and at bedtime. 10/11/19   Karie Fetch P, DO  sodium chloride HYPERTONIC 3 % nebulizer solution Take by nebulization as needed for other. Patient taking differently: Take 4 mLs by nebulization as needed for other. 04/01/22   [name], NP  sucralfate (CARAFATE) 1 GM/10ML suspension Take 10 mLs (1 g total) by mouth  2 (two) times daily. 12/23/22 12/23/23  Mansouraty, Netty Starring., MD  tamsulosin (FLOMAX) 0.4 MG CAPS capsule Take 0.4 mg by mouth daily. 03/04/16   [provider]      Allergies    Patient has no active allergies.    Review of Systems   Review of Systems  Physical Exam Updated Vital Signs BP (!) 141/71 (BP Location: Left Arm)   Pulse 60   Temp 97.7 F (36.5 C) (Oral)   Resp 20   SpO2 100%  Physical Exam Constitutional:      Comments: Alert nontoxic well in appearance.  Clear mental status.  No respiratory distress.  Pulmonary:     Effort: Pulmonary effort is  normal.  Musculoskeletal:     Comments: Patient has moderate soft swelling at the posterior aspect just superior to the olecranon prominence.  This is not firm or distended.  No overlying erythema or abrasion.  Soft and pliable.  Patient does not have bony point tenderness over the olecranon or medial epicondyle.  Some tenderness at the lateral epicondyle and some slightly superior in the area of the fullness.  Range of motion of the elbow is good patient can go into flexion and extension without significant pain.  The hand is warm and dry no edema of the hand or the wrist.  Radial pulse 2+.  Grip strength strong.  See attached images.  Skin:    General: Skin is warm and dry.  Neurological:     General: No focal deficit present.     Mental Status: He is oriented to person, place, and time.  Psychiatric:        Mood and Affect: Mood normal.        ED Results / Procedures / Treatments   Labs (all labs ordered are listed, but only abnormal results are displayed) Labs Reviewed - No data to display  EKG None  Radiology DG Elbow Complete Right  Result Date: 01/30/2023 CLINICAL DATA:  82 year old male status post fall 1 week ago. Progressive pain and swelling. EXAM: RIGHT ELBOW - COMPLETE 3+ VIEW COMPARISON:  None Available. FINDINGS: Moderate to severe soft tissue swelling along the medial and posterior elbow. Bone mineralization is within normal limits. Maintained joint spaces and alignment. No evidence of joint effusion. Prominent olecranon degenerative spurring. Radial head appears intact. No acute osseous abnormality identified. IMPRESSION: Soft tissue swelling with no evidence of joint effusion, no acute fracture or dislocation identified about the right elbow. Electronically Signed   By: Odessa Fleming M.D.   On: 01/30/2023 10:31    Procedures Procedures    Medications Ordered in ED Medications - No data to display  ED Course/ Medical Decision Making/ A&P                              Medical Decision Making Amount and/or Complexity of Data Reviewed Radiology: ordered.   Fell 1 week ago.  He does take Eliquis.  He has a moderate soft swelling above the right elbow.  At this time there is no ecchymotic discoloration and no erythema.  No sign of cellulitis or septic bursitis.  I suspect resolving hematoma however, interestingly there is no ecchymotic discoloration which leans against hematoma.  Other differential includes traumatic bursitis.  Again patient does not have any erythema or cellulitic changes.  He has good range of motion of the elbow and I have very low suspicion for septic joint\septic bursitis or cellulitis.  X-rays do not show any fracture.  With good range of motion and normal neurovascular function patient stable for elevating icing and Tylenol as needed.  I recommended follow-up with orthopedics.        Final Clinical Impression(s) / ED Diagnoses Final diagnoses:  Elbow swelling, right    Rx / DC Orders ED Discharge Orders     None         Arby Barrette, MD 01/30/23 1302

## 2023-01-30 NOTE — ED Triage Notes (Signed)
Larey Seat a week ago.hit right elbow.no loc. Nothing hurt at the time, but has progressively become painful and now slightly swollen.

## 2023-01-30 NOTE — ED Triage Notes (Signed)
Pt is on eliquis 

## 2023-01-30 NOTE — ED Triage Notes (Signed)
Pt is currently receiving IV chem for pancreatic cancer pt has port.

## 2023-01-31 ENCOUNTER — Encounter (HOSPITAL_COMMUNITY): Payer: Self-pay | Admitting: Gastroenterology

## 2023-02-02 ENCOUNTER — Encounter: Payer: Self-pay | Admitting: Oncology

## 2023-02-02 NOTE — Progress Notes (Signed)
Call from [facility] [place]. Patient coming in at 3:20 pm for oncology consult and they have no records.  Informed her we were not aware of the referral. Faxed last note, labs, chemo flow sheet and pathology to [phone]

## 2023-02-03 ENCOUNTER — Encounter: Payer: Self-pay | Admitting: Adult Health

## 2023-02-03 ENCOUNTER — Ambulatory Visit (INDEPENDENT_AMBULATORY_CARE_PROVIDER_SITE_OTHER): Payer: Medicare Other | Admitting: Adult Health

## 2023-02-03 VITALS — BP 129/59 | HR 63 | Ht 68.0 in | Wt 213.6 lb

## 2023-02-03 DIAGNOSIS — G459 Transient cerebral ischemic attack, unspecified: Secondary | ICD-10-CM | POA: Diagnosis not present

## 2023-02-03 NOTE — Progress Notes (Signed)
PATIENT: Marcus [name] DOB: 21-Nov-1940  REASON FOR VISIT: follow up HISTORY FROM: patient PRIMARY NEUROLOGIST: Dr. Pearlean Brownie  Chief Complaint  Patient presents with   Follow-up    Pt in 9 Pt unaware what the appointment is for today Pt states ruled out stroke while in hospital      HISTORY OF PRESENT ILLNESS: Today 02/03/23  Marcus [name] is a 82 y.o. male here for hospital follow-up after possible to event while in the hospital.  Patient was in the hospital for ERCP and developed diplopia.  Symptoms resolved spontaneously.  He was off of Coumadin during that time.  Possible TIA or effect of anesthesia?  Patient is now on Eliquis.  Although it is currently being held for another procedure that will take place on August 5.  He denies any additional symptoms.  Primary care is managing blood pressure and cholesterol.  Patient was taken off of simvastatin while in the hospital due to elevated LFTs. he returns today for an evaluation.   HISTORY [name] is a 82 y.o. male with history of afib on coumadin, HTN, pacemaker, prostate cancer, s/p gastric bypass and recent pancreatic mass admitted for ERCP which was done yesterday. Developed diplopia post procedure but has resolved. No tPA given due to symptoms resolved.     TIA:  posterior TIA likely embolic secondary to PAF off coumadin for GI procedure CT no acute finding  MRI no acute infarct MRA head and neck unremarkable except single short segment moderate to severe distal right P3 stenosis. 2D Echo EF 45 to 50% LDL pending HgbA1c 6.3  lovenox for VTE prophylaxis warfarin daily prior to admission, now on  lovenox therapeutic dosing . Once no more GI precudure planned, will consider to switch to DOAC. Patient counseled to be compliant with his antithrombotic medications Ongoing aggressive stroke risk factor management Disposition:  home soon  REVIEW OF SYSTEMS: Out of a complete 14 system review of symptoms, the patient  complains only of the following symptoms, and all other reviewed systems are negative.  ALLERGIES: No Active Allergies  HOME MEDICATIONS: Outpatient Medications Prior to Visit  Medication Sig Dispense Refill   acetaminophen (TYLENOL) 325 MG tablet Take 2 tablets (650 mg total) by mouth every 6 (six) hours as needed for mild pain (or Fever >/= 101). 20 tablet 0   allopurinol (ZYLOPRIM) 300 MG tablet Take 1 tablet (300 mg total) by mouth daily. 90 tablet 3   amLODipine (NORVASC) 10 MG tablet Take 1 tablet (10 mg total) by mouth daily. 90 tablet 3   amoxicillin-clavulanate (AUGMENTIN) 875-125 MG tablet Take 1 tablet by mouth. TAKE EVERY 12 HOURS     apixaban (ELIQUIS) 5 MG TABS tablet Take 1 tablet (5 mg total) by mouth 2 (two) times daily. 60 tablet 0   Calcium Carb-Cholecalciferol (CALCIUM 600 + D PO) Take 1 tablet by mouth daily. 800 units of vitamin D     esomeprazole (NEXIUM) 40 MG capsule Take 1 capsule (40 mg total) by mouth 2 (two) times daily before a meal. 60 capsule 12   Eszopiclone 3 MG TABS Take 1 tablet (3 mg total) by mouth at bedtime. Take immediately before bedtime 30 tablet 2   furosemide (LASIX) 40 MG tablet Take 1 tablet (40 mg total) by mouth daily. 30 tablet 0   hydrOXYzine (ATARAX) 50 MG tablet Take 1 tablet (50 mg total) by mouth 3 (three) times daily as needed. 270 tablet 1   lidocaine-prilocaine (EMLA)  cream Apply 1 Application topically as needed. 30 g 0   Multiple Vitamin (MULTIVITAMIN) tablet Take 1 tablet by mouth in the morning.     ondansetron (ZOFRAN) 8 MG tablet Take 1 tablet (8 mg total) by mouth every 8 (eight) hours as needed. 20 tablet 0   polyethylene glycol powder (GLYCOLAX/MIRALAX) 17 GM/SCOOP powder Take 17g (1 capful) by mouth as directed daily as needed for mild constipation. 238 g 0   potassium chloride SA (KLOR-CON M) 20 MEQ tablet Take 1 tablet (20 mEq total) by mouth daily. 30 tablet 0   Respiratory Therapy Supplies (FLUTTER) DEVI 1 each by Does not  apply route in the morning and at bedtime. 1 each 0   sodium chloride HYPERTONIC 3 % nebulizer solution Take by nebulization as needed for other. (Patient taking differently: Take 4 mLs by nebulization as needed for other.) 750 mL 12   sucralfate (CARAFATE) 1 GM/10ML suspension Take 10 mLs (1 g total) by mouth 2 (two) times daily. 420 mL 2   tamsulosin (FLOMAX) 0.4 MG CAPS capsule Take 0.4 mg by mouth daily.     No facility-administered medications prior to visit.    PAST MEDICAL HISTORY: Past Medical History:  Diagnosis Date   Acute meniscal tear of knee left   Arthritis    generalized   AV block 08/01/2018   Benign essential hypertension 01/18/2007   Bronchiectasis with acute exacerbation 06/23/2020   Chest pain, atypical 08/23/2014   Chronic cough 10/07/2014   Coronary artery disease    Degeneration of lumbar intervertebral disc 10/14/2017   Diverticulosis of colon 07/07/2005   Elevated PSA 06/19/2014   GERD (gastroesophageal reflux disease) 01/18/2007   Gout, unspecified 01/18/2007   Hemorrhoids 01/19/2011   Hiatal hernia    History of colonic polyps 01/18/2007   Insomnia, unspecified 08/21/2007   Iron deficiency anemia, unspecified  01/19/2011   Lumbar post-laminectomy syndrome 10/14/2017   Lumbar spondylolysis    Lumbar strain 12/14/2011   Obstructive sleep apnea 01/18/2007   uses CPAP nightly   Paraesophageal hernia    large   Primary osteoarthritis of knee 05/23/2015   Prostate cancer (HCC) 2016   S/P knee replacement 05/23/2015   Sensorineural hearing loss, bilateral    Vitamin B12 deficiency    Vitamin D deficiency 10/28/2009    PAST SURGICAL HISTORY: Past Surgical History:  Procedure Laterality Date   BILIARY BRUSHING  12/13/2022   Procedure: BILIARY BRUSHING;  Surgeon: Iva Boop, MD;  Location: [facility] ENDOSCOPY;  Service: Gastroenterology;;   BILIARY STENT PLACEMENT  12/13/2022   Procedure: BILIARY STENT PLACEMENT;  Surgeon: Iva Boop, MD;   Location[facility] ENDOSCOPY;  Service: Gastroenterology;;   BIOPSY  12/23/2022   Procedure: BIOPSY;  Surgeon: Lemar Lofty., MD;  Location: [facility] ENDOSCOPY;  Service: Gastroenterology;;   BRONCHIAL WASHINGS  10/30/2019   Procedure: BRONCHIAL WASHINGS;  Surgeon: [name], DO;  Location: [facility] ENDOSCOPY;  Service: Endoscopy;;   CARDIAC CATHETERIZATION     CATARACT EXTRACTION W/ INTRAOCULAR LENS  IMPLANT, BILATERAL Bilateral    COLONOSCOPY     ERCP N/A 12/13/2022   Procedure: ENDOSCOPIC RETROGRADE CHOLANGIOPANCREATOGRAPHY (ERCP);  Surgeon: Iva Boop, MD;  Location: [facility] ENDOSCOPY;  Service: Gastroenterology;  Laterality: N/A;  with stent placement   ESOPHAGOGASTRODUODENOSCOPY     ESOPHAGOGASTRODUODENOSCOPY (EGD) WITH PROPOFOL N/A 12/23/2022   Procedure: ESOPHAGOGASTRODUODENOSCOPY (EGD) WITH PROPOFOL;  Surgeon: Meridee Score Netty Starring., MD;  Location: [facility] ENDOSCOPY;  Service: Gastroenterology;  Laterality: N/A;   EUS N/A 12/23/2022  Procedure: UPPER ENDOSCOPIC ULTRASOUND (EUS) RADIAL;  Surgeon: Meridee Score Netty Starring., MD;  Location: [facility] ENDOSCOPY;  Service: Gastroenterology;  Laterality: N/A;   FINE NEEDLE ASPIRATION  12/23/2022   Procedure: FINE NEEDLE ASPIRATION (FNA) RADIAL;  Surgeon: Lemar Lofty., MD;  Location: [facility] ENDOSCOPY;  Service: Gastroenterology;;   IR IMAGING GUIDED PORT INSERTION  01/04/2023   KNEE ARTHROSCOPY Right 2007   KNEE ARTHROSCOPY  07/13/2011   Procedure: ARTHROSCOPY KNEE;  Surgeon: Erasmo Leventhal;  Location: [facility];  Service: Orthopedics;  Laterality: Left;  partial menisectomy with chondrylplasty   KNEE SURGERY Left    LAMINECTOMY AND MICRODISCECTOMY LUMBAR SPINE  MARCH  2008   L3 -  4   LAPAROSCOPIC INCISIONAL / UMBILICAL / VENTRAL HERNIA REPAIR  2006   PACEMAKER IMPLANT N/A 08/02/2018   Procedure: PACEMAKER IMPLANT;  Surgeon: Marinus Maw, MD;  Location: [facility] INVASIVE CV LAB;  Service: Cardiovascular;  Laterality: N/A;   PROSTATE  BIOPSY     RADIOACTIVE SEED IMPLANT N/A 02/12/2019   Procedure: RADIOACTIVE SEED IMPLANT/BRACHYTHERAPY IMPLANT;  Surgeon: Marcine Matar, MD;  Location: [facility] ORS;  Service: Urology;  Laterality: N/A;  90 MINS   SHOULDER ARTHROSCOPY Right 05-23-2007   SIGMOID COLECTOMY FOR CANCER  1989   SPACE OAR INSTILLATION N/A 02/12/2019   Procedure: SPACE OAR INSTILLATION;  Surgeon: Marcine Matar, MD;  Location: [facility] ORS;  Service: Urology;  Laterality: N/A;   SPHINCTEROTOMY  12/13/2022   Procedure: SPHINCTEROTOMY;  Surgeon: Iva Boop, MD;  Location: [facility] ENDOSCOPY;  Service: Gastroenterology;;   TOTAL KNEE ARTHROPLASTY Left 05/23/2015   Procedure: LEFT TOTAL KNEE ARTHROPLASTY;  Surgeon: Eugenia Mcalpine, MD;  Location: [facility] ORS;  Service: Orthopedics;  Laterality: Left;   VERTICAL BANDED GASTROPLASTY  1986   VIDEO BRONCHOSCOPY N/A 10/30/2019   Procedure: VIDEO BRONCHOSCOPY WITH BRONICAL ALVEROLAR LAVAGE WITHOUT FLUORO;  Surgeon: [name], DO;  Location: [facility] ENDOSCOPY;  Service: Endoscopy;  Laterality: N/A;    FAMILY HISTORY: Family History  Problem Relation Age of Onset   Stroke Paternal Grandfather    Heart disease Paternal Grandfather    Esophagitis Father        died from perforated esophagus   Breast cancer Mother    Colon cancer Maternal Grandmother        questionable   Esophageal cancer Neg Hx    Prostate cancer Neg Hx    Stomach cancer Neg Hx     SOCIAL HISTORY: Social History   Socioeconomic History   Marital status: Widowed    Spouse name: Not on file   Number of children: 4   Years of education: 32   Highest education level: Some college, no degree  Occupational History   Occupation: retired    Associate Professor: [employer]  Tobacco Use   Smoking status: Former    Current packs/day: 0.00    Average packs/day: 2.0 packs/day for 20.0 years (40.0 ttl pk-yrs)    Types: Cigarettes    Start date: 07/05/1954    Quit date: 07/05/1974    Years since quitting: 48.6   Smokeless  tobacco: Never  Vaping Use   Vaping status: Never Used  Substance and Sexual Activity   Alcohol use: Not Currently    Alcohol/week: 30.0 standard drinks of alcohol    Types: 30 Standard drinks or equivalent per week    Comment: 4-5 glasses of wine or 2-3 beers nightly   Drug use: No   Sexual activity: Not Currently  Other Topics Concern  Not on file  Social History Narrative   Recently widowed   Former Smoker    Alcohol use-yes 1-2 drinks per day      Occupation: Retired Associate Professor      Originally from [place], [place] - in [place] > 10 yrs as of 2017      Social Determinants of Health   Financial Resource Strain: Low Risk  (07/28/2021)   Overall Financial Resource Strain (CARDIA)    Difficulty of Paying Living Expenses: Not hard at all  Food Insecurity: No Food Insecurity (01/14/2023)   Hunger Vital Sign    Worried About Running Out of Food in the Last Year: Never true    Ran Out of Food in the Last Year: Never true  Transportation Needs: No Transportation Needs (01/14/2023)   PRAPARE - Administrator, Civil Service (Medical): No    Lack of Transportation (Non-Medical): No  Physical Activity: Inactive (07/28/2021)   Exercise Vital Sign    Days of Exercise per Week: 0 days    Minutes of Exercise per Session: 0 min  Stress: No Stress Concern Present (07/28/2021)   [employer] of Occupational Health - Occupational Stress Questionnaire    Feeling of Stress : Not at all  Social Connections: Moderately Isolated (08/04/2020)   Social Connection and Isolation Panel [NHANES]    Frequency of Communication with Friends and Family: Three times a week    Frequency of Social Gatherings with Friends and Family: More than three times a week    Attends Religious Services: More than 4 times per year    Active Member of Clubs or Organizations: No    Attends Banker Meetings: Never    Marital Status: Widowed  Intimate Partner Violence: Not At Risk (01/10/2023)    Humiliation, Afraid, Rape, and Kick questionnaire    Fear of Current or Ex-Partner: No    Emotionally Abused: No    Physically Abused: No    Sexually Abused: No      PHYSICAL EXAM  Vitals:   02/03/23 0845  BP: (!) 129/59  Pulse: 63  Weight: 213 lb 9.6 oz (96.9 kg)  Height: 5\' 8"  (1.727 m)   Body mass index is 32.48 kg/m.  Generalized: Well developed, in no acute distress   Neurological examination  Mentation: Alert oriented to time, place, history taking. Follows all commands speech and language fluent Cranial nerve II-XII: Pupils were equal round reactive to light. Extraocular movements were full, visual field were full on confrontational test. Facial sensation and strength were normal.  Head turning and shoulder shrug  were normal and symmetric. Motor: The motor testing reveals 5 over 5 strength of all 4 extremities. Good symmetric motor tone is noted throughout.  Sensory: Sensory testing is intact to soft touch on all 4 extremities. No evidence of extinction is noted.  Coordination: Cerebellar testing reveals good finger-nose-finger and heel-to-shin bilaterally.  Gait and station: Gait is normal.  Reflexes: Deep tendon reflexes are symmetric and normal bilaterally.   DIAGNOSTIC DATA (LABS, IMAGING, TESTING) - I reviewed patient records, labs, notes, testing and imaging myself where available.  Lab Results  Component Value Date   WBC 4.5 01/21/2023   HGB 9.1 (L) 01/21/2023   HCT 29.2 (L) 01/21/2023   MCV 96.1 01/21/2023   PLT 400 01/21/2023      Component Value Date/Time   NA 142 01/21/2023 1007   NA 144 01/18/2023 1209   K 4.4 01/21/2023 1007   CL 109 01/21/2023 1007  CO2 26 01/21/2023 1007   GLUCOSE 131 (H) 01/21/2023 1007   BUN 20 01/21/2023 1007   BUN 20 01/18/2023 1209   CREATININE 0.94 01/21/2023 1007   CREATININE 1.24 01/01/2011 1609   CALCIUM 9.1 01/21/2023 1007   PROT 6.3 (L) 01/21/2023 1007   ALBUMIN 3.6 01/21/2023 1007   AST 26 01/21/2023 1007    ALT 21 01/21/2023 1007   ALKPHOS 160 (H) 01/21/2023 1007   BILITOT 0.7 01/21/2023 1007   GFRNONAA >60 01/21/2023 1007   GFRAA >60 02/07/2019 1446   Lab Results  Component Value Date   CHOL 213 (H) 12/15/2022   HDL 35 (L) 12/15/2022   LDLCALC 156 (H) 12/15/2022   TRIG 111 12/15/2022   CHOLHDL 6.1 12/15/2022   Lab Results  Component Value Date   HGBA1C 6.3 11/24/2022   Lab Results  Component Value Date   VITAMINB12 794 01/13/2023   Lab Results  Component Value Date   TSH 0.54 11/24/2022      ASSESSMENT AND PLAN 82 y.o. year old male  has a past medical history of Acute meniscal tear of knee (left), Arthritis, AV block (08/01/2018), Benign essential hypertension (01/18/2007), Bronchiectasis with acute exacerbation (06/23/2020), Chest pain, atypical (08/23/2014), Chronic cough (10/07/2014), Coronary artery disease, Degeneration of lumbar intervertebral disc (10/14/2017), Diverticulosis of colon (07/07/2005), Elevated PSA (06/19/2014), GERD (gastroesophageal reflux disease) (01/18/2007), Gout, unspecified (01/18/2007), Hemorrhoids (01/19/2011), Hiatal hernia, History of colonic polyps (01/18/2007), Insomnia, unspecified (08/21/2007), Iron deficiency anemia, unspecified  (01/19/2011), Lumbar post-laminectomy syndrome (10/14/2017), Lumbar spondylolysis, Lumbar strain (12/14/2011), Obstructive sleep apnea (01/18/2007), Paraesophageal hernia, Primary osteoarthritis of knee (05/23/2015), Prostate cancer (HCC) (2016), S/P knee replacement (05/23/2015), Sensorineural hearing loss, bilateral, Vitamin B12 deficiency, and Vitamin D deficiency (10/28/2009). here with:  TIA   Remains Eliquis (apixaban) daily  for secondary stroke prevention.  (Currently being held for procedure).Discussed secondary stroke prevention measures and importance of close PCP follow up for aggressive stroke risk factor management. I have gone over the pathophysiology of stroke, warning signs and symptoms, risk factors  and their management in some detail with instructions to go to the closest emergency room for symptoms of concern. HTN: BP goal <130/90.   HLD: LDL goal <70. Recent LDL 156.  DMII: A1c goal<7.0. Recent A1c 6.3.  Encouraged patient to monitor diet and encouraged exercise FU with our office PRN      Butch Penny, MSN, NP-C 02/03/2023, 8:15 AM [facility] [address] [place], [place] [address] [phone]

## 2023-02-04 ENCOUNTER — Encounter: Payer: Self-pay | Admitting: Nurse Practitioner

## 2023-02-04 ENCOUNTER — Inpatient Hospital Stay: Payer: No Typology Code available for payment source

## 2023-02-04 ENCOUNTER — Inpatient Hospital Stay (HOSPITAL_BASED_OUTPATIENT_CLINIC_OR_DEPARTMENT_OTHER): Payer: No Typology Code available for payment source | Admitting: Nurse Practitioner

## 2023-02-04 ENCOUNTER — Inpatient Hospital Stay: Payer: No Typology Code available for payment source | Attending: Oncology

## 2023-02-04 ENCOUNTER — Telehealth: Payer: Self-pay | Admitting: *Deleted

## 2023-02-04 VITALS — BP 134/72 | HR 62

## 2023-02-04 VITALS — BP 139/61 | HR 65 | Temp 98.1°F | Resp 18 | Ht 68.0 in | Wt 214.0 lb

## 2023-02-04 DIAGNOSIS — C259 Malignant neoplasm of pancreas, unspecified: Secondary | ICD-10-CM

## 2023-02-04 DIAGNOSIS — Z85038 Personal history of other malignant neoplasm of large intestine: Secondary | ICD-10-CM | POA: Diagnosis not present

## 2023-02-04 DIAGNOSIS — D649 Anemia, unspecified: Secondary | ICD-10-CM | POA: Diagnosis not present

## 2023-02-04 DIAGNOSIS — C250 Malignant neoplasm of head of pancreas: Secondary | ICD-10-CM | POA: Diagnosis not present

## 2023-02-04 DIAGNOSIS — M109 Gout, unspecified: Secondary | ICD-10-CM | POA: Insufficient documentation

## 2023-02-04 DIAGNOSIS — Z5111 Encounter for antineoplastic chemotherapy: Secondary | ICD-10-CM | POA: Insufficient documentation

## 2023-02-04 DIAGNOSIS — G4730 Sleep apnea, unspecified: Secondary | ICD-10-CM | POA: Diagnosis not present

## 2023-02-04 DIAGNOSIS — I2510 Atherosclerotic heart disease of native coronary artery without angina pectoris: Secondary | ICD-10-CM | POA: Insufficient documentation

## 2023-02-04 DIAGNOSIS — Z9884 Bariatric surgery status: Secondary | ICD-10-CM | POA: Insufficient documentation

## 2023-02-04 DIAGNOSIS — I4891 Unspecified atrial fibrillation: Secondary | ICD-10-CM | POA: Insufficient documentation

## 2023-02-04 DIAGNOSIS — I10 Essential (primary) hypertension: Secondary | ICD-10-CM | POA: Insufficient documentation

## 2023-02-04 DIAGNOSIS — K219 Gastro-esophageal reflux disease without esophagitis: Secondary | ICD-10-CM | POA: Diagnosis not present

## 2023-02-04 LAB — CBC WITH DIFFERENTIAL (CANCER CENTER ONLY)
Abs Immature Granulocytes: 0.09 10*3/uL — ABNORMAL HIGH (ref 0.00–0.07)
Basophils Absolute: 0 10*3/uL (ref 0.0–0.1)
Basophils Relative: 1 %
Eosinophils Absolute: 0.1 10*3/uL (ref 0.0–0.5)
Eosinophils Relative: 3 %
HCT: 27.5 % — ABNORMAL LOW (ref 39.0–52.0)
Hemoglobin: 8.7 g/dL — ABNORMAL LOW (ref 13.0–17.0)
Immature Granulocytes: 2 %
Lymphocytes Relative: 20 %
Lymphs Abs: 1 10*3/uL (ref 0.7–4.0)
MCH: 29.2 pg (ref 26.0–34.0)
MCHC: 31.6 g/dL (ref 30.0–36.0)
MCV: 92.3 fL (ref 80.0–100.0)
Monocytes Absolute: 0.8 10*3/uL (ref 0.1–1.0)
Monocytes Relative: 16 %
Neutro Abs: 2.9 10*3/uL (ref 1.7–7.7)
Neutrophils Relative %: 58 %
Platelet Count: 221 10*3/uL (ref 150–400)
RBC: 2.98 MIL/uL — ABNORMAL LOW (ref 4.22–5.81)
RDW: 15.2 % (ref 11.5–15.5)
WBC Count: 4.9 10*3/uL (ref 4.0–10.5)
nRBC: 0 % (ref 0.0–0.2)

## 2023-02-04 LAB — CMP (CANCER CENTER ONLY)
ALT: 42 U/L (ref 0–44)
AST: 23 U/L (ref 15–41)
Albumin: 3.4 g/dL — ABNORMAL LOW (ref 3.5–5.0)
Alkaline Phosphatase: 304 U/L — ABNORMAL HIGH (ref 38–126)
Anion gap: 7 (ref 5–15)
BUN: 26 mg/dL — ABNORMAL HIGH (ref 8–23)
CO2: 27 mmol/L (ref 22–32)
Calcium: 8.6 mg/dL — ABNORMAL LOW (ref 8.9–10.3)
Chloride: 108 mmol/L (ref 98–111)
Creatinine: 0.96 mg/dL (ref 0.61–1.24)
GFR, Estimated: >60 mL/min (ref >60)
Glucose, Bld: 132 mg/dL — ABNORMAL HIGH (ref 70–99)
Potassium: 3.6 mmol/L (ref 3.5–5.1)
Sodium: 142 mmol/L (ref 135–145)
Total Bilirubin: 0.5 mg/dL (ref 0.3–1.2)
Total Protein: 6.3 g/dL — ABNORMAL LOW (ref 6.5–8.1)

## 2023-02-04 MED ORDER — PACLITAXEL PROTEIN-BOUND CHEMO INJECTION 100 MG
100.0000 mg/m2 | Freq: Once | INTRAVENOUS | Status: AC
  Administered 2023-02-04: 200 mg via INTRAVENOUS
  Filled 2023-02-04: qty 40

## 2023-02-04 MED ORDER — SODIUM CHLORIDE 0.9 % IV SOLN: Freq: Once | INTRAVENOUS | Status: AC

## 2023-02-04 MED ORDER — PROCHLORPERAZINE MALEATE 10 MG PO TABS
10.0000 mg | ORAL_TABLET | Freq: Once | ORAL | Status: AC
  Administered 2023-02-04: 10 mg via ORAL
  Filled 2023-02-04: qty 1

## 2023-02-04 MED ORDER — HEPARIN SOD (PORK) LOCK FLUSH 100 UNIT/ML IV SOLN: 500.0000 [IU] | Freq: Once | INTRAVENOUS | Status: DC | PRN

## 2023-02-04 MED ORDER — SODIUM CHLORIDE 0.9% FLUSH: 10.0000 mL | INTRAVENOUS | Status: DC | PRN

## 2023-02-04 MED ORDER — SODIUM CHLORIDE 0.9 % IV SOLN
800.0000 mg/m2 | Freq: Once | INTRAVENOUS | Status: AC
  Administered 2023-02-04: 1710 mg via INTRAVENOUS
  Filled 2023-02-04: qty 25.98

## 2023-02-04 NOTE — Progress Notes (Signed)
I agree with the above plan 

## 2023-02-04 NOTE — Progress Notes (Signed)
Patient seen by Lisa Thomas NP today  Vitals are within treatment parameters.  Labs reviewed by Lisa Thomas NP and are within treatment parameters.  Per physician team, patient is ready for treatment and there are NO modifications to the treatment plan.     

## 2023-02-04 NOTE — Progress Notes (Signed)
  [facility] OFFICE PROGRESS NOTE   Diagnosis: Pancreas cancer  INTERVAL HISTORY:   Marcus [name] returns as scheduled.  He completed cycle 2 gemcitabine/Abraxane 01/21/2023.  He denies nausea/vomiting.  No mouth sores.  No diarrhea.  No rash.  He intermittently notes a low-grade fever.  No temperature greater than 100.5.  No coughing or shortness of breath.  No urinary symptoms.  Last night his feet felt numb.  No numbness today.  Objective:  Vital signs in last 24 hours:  Blood pressure 139/61, pulse 65, temperature 98.1 F (36.7 C), temperature source Oral, resp. rate 18, height 5\' 8"  (1.727 m), weight 214 lb (97.1 kg), SpO2 100%.    HEENT: No thrush or ulcers. Resp: Lungs clear bilaterally. Cardio: Regular rate and rhythm. GI: Abdomen soft and nontender.  No hepatosplenomegaly. Vascular: Trace lower leg edema bilaterally.  Skin: No rash. Port-A-Cath without erythema.  Lab Results:  Lab Results  Component Value Date   WBC 4.9 02/04/2023   HGB 8.7 (L) 02/04/2023   HCT 27.5 (L) 02/04/2023   MCV 92.3 02/04/2023   PLT 221 02/04/2023   NEUTROABS 2.9 02/04/2023    Imaging:  No results found.  Medications: I have reviewed the patient's current medications.  Assessment/Plan: Pancreas cancer CT abdomen/pelvis 12/03/2022-suspected pancreas mass in the proximal body measuring 2 cm with probable mass effect on the superior aspect of the SMV, multiple hypodense liver lesions, many are new from a remote CT-indeterminate, 2 cystic lesions in the pancreas tail-nonspecific prior bariatric surgery ERCP 12/13/2022-malignant appearing common bile duct stricture, plastic stent placed, brushing cytology-no malignant cells EUS 12/23/2022-30 x 33 mm pancreas head mass with invasion of the SMV, no malignant appearing lymph nodes, 4 cysts in the visualized liver, FNA-adenocarcinoma Cycle 1 gemcitabine and abraxane 01/07/2023 Cycle 2 gemcitabine and abraxane 01/21/2023, gemcitabine  dose reduced due to neutropenia following cycle 1 Cycle 3 gemcitabine/Abraxane 02/04/2023 Biliary obstruction secondary to 1-status post ERCP/stent placement 12/13/2022 Coronary artery disease Atrial fibrillation Gout Sleep apnea Status post bariatric surgery Hypertension Bronchiectasis Gastroesophageal reflux disease History of colon cancer in his 40s Complete heart block-pacemaker placed Admission 01/09/2023 with a fever and failure to thrive, CT chest with evidence of left lung pneumonia, CT abdomen/pelvis with a distended gallbladder and adjacent fat stranding Anemia  Disposition: [name] appears stable.  He has completed 2 cycles of gemcitabine/Abraxane.  He tolerated cycle 2 well.  Plan to proceed with cycle 3 today as scheduled.  We will check a CA 19-9 level when he returns in 2 weeks.  Restaging CTs after he has completed 5 or 6 cycles.  CBC and chemistry panel reviewed.  Labs adequate to proceed with treatment today.  He will return for follow-up and treatment in 2 weeks.  Patient seen with Dr. Truett Perna.  Lonna Cobb ANP/GNP-BC   02/04/2023  1:59 PM  This was a shared visit with Lonna Cobb.  [name] tolerated the second cycle of gemcitabine/Abraxane well.  He will complete cycle 3 today.  He will return for an office visit in 2 weeks.  We will check the CA 19-9 when he returns in 2 weeks.  We discussed treatment options with Mr. Calabretta and his daughter.  Mancel Bale, MD

## 2023-02-04 NOTE — Patient Instructions (Signed)
[facility]   Discharge Instructions: Thank you for choosing [facility] to provide your oncology and hematology care.   If you have a lab appointment with the Cancer Center, please go directly to the Cancer Center and check in at the registration area.   Wear comfortable clothing and clothing appropriate for easy access to any Portacath or PICC line.   We strive to give you quality time with your provider. You may need to reschedule your appointment if you arrive late (15 or more minutes).  Arriving late affects you and other patients whose appointments are after yours.  Also, if you miss three or more appointments without notifying the office, you may be dismissed from the clinic at the provider's discretion.      For prescription refill requests, have your pharmacy contact our office and allow 72 hours for refills to be completed.    Today you received the following chemotherapy and/or immunotherapy agents Paclitaxel-protein bound (ABRAXANE) & Gemcitabine (GEMZAR).   To help prevent nausea and vomiting after your treatment, we encourage you to take your nausea medication as directed.  BELOW ARE SYMPTOMS THAT SHOULD BE REPORTED IMMEDIATELY: *FEVER GREATER THAN 100.4 F (38 C) OR HIGHER *CHILLS OR SWEATING *NAUSEA AND VOMITING THAT IS NOT CONTROLLED WITH YOUR NAUSEA MEDICATION *UNUSUAL SHORTNESS OF BREATH *UNUSUAL BRUISING OR BLEEDING *URINARY PROBLEMS (pain or burning when urinating, or frequent urination) *BOWEL PROBLEMS (unusual diarrhea, constipation, pain near the anus) TENDERNESS IN MOUTH AND THROAT WITH OR WITHOUT PRESENCE OF ULCERS (sore throat, sores in mouth, or a toothache) UNUSUAL RASH, SWELLING OR PAIN  UNUSUAL VAGINAL DISCHARGE OR ITCHING   Items with * indicate a potential emergency and should be followed up as soon as possible or go to the Emergency Department if any problems should occur.  Please show the CHEMOTHERAPY  ALERT CARD or IMMUNOTHERAPY ALERT CARD at check-in to the Emergency Department and triage nurse.  Should you have questions after your visit or need to cancel or reschedule your appointment, please contact [facility]  Dept: [phone]  and follow the prompts.  Office hours are 8:00 a.m. to 4:30 p.m. Monday - Friday. Please note that voicemails left after 4:00 p.m. may not be returned until the following business day.  We are closed weekends and major holidays. You have access to a nurse at all times for urgent questions. Please call the main number to the clinic Dept: [phone] and follow the prompts.   For any non-urgent questions, you may also contact your provider using MyChart. We now offer e-Visits for anyone 18 and older to request care online for non-urgent symptoms. For details visit [email or web address].   Also download the MyChart app! Go to the app store, search "MyChart", open the app, select [facility], and log in with your MyChart username and password.  Paclitaxel Nanoparticle Albumin-Bound Injection What is this medication? NANOPARTICLE ALBUMIN-BOUND PACLITAXEL (Na no PAHR ti kuhl al BYOO muhn-bound PAK li TAX el) treats some types of cancer. It works by slowing down the growth of cancer cells. This medicine may be used for other purposes; ask your health care provider or pharmacist if you have questions. COMMON BRAND NAME(S): Abraxane What should I tell my care team before I take this medication? They need to know if you have any of these conditions: Liver disease Low white blood cell levels An unusual or allergic reaction to paclitaxel, albumin, other medications, foods, dyes, or preservatives   If you or your partner are pregnant or trying to get pregnant Breast-feeding How should I use this medication? This medication is injected into a vein. It is given by your care team in a hospital or clinic setting. Talk to your care team  about the use of this medication in children. Special care may be needed. Overdosage: If you think you have taken too much of this medicine contact a poison control center or emergency room at once. NOTE: This medicine is only for you. Do not share this medicine with others. What if I miss a dose? Keep appointments for follow-up doses. It is important not to miss your dose. Call your care team if you are unable to keep an appointment. What may interact with this medication? Other medications may affect the way this medication works. Talk with your care team about all of the medications you take. They may suggest changes to your treatment plan to lower the risk of side effects and to make sure your medications work as intended. This list may not describe all possible interactions. Give your health care provider a list of all the medicines, herbs, non-prescription drugs, or dietary supplements you use. Also tell them if you smoke, drink alcohol, or use illegal drugs. Some items may interact with your medicine. What should I watch for while using this medication? Your condition will be monitored carefully while you are receiving this medication. You may need blood work while taking this medication. This medication may make you feel generally unwell. This is not uncommon as chemotherapy can affect healthy cells as well as cancer cells. Report any side effects. Continue your course of treatment even though you feel ill unless your care team tells you to stop. This medication can cause serious allergic reactions. To reduce the risk, your care team may give you other medications to take before receiving this one. Be sure to follow the directions from your care team. This medication may increase your risk of getting an infection. Call your care team for advice if you get a fever, chills, sore throat, or other symptoms of a cold or flu. Do not treat yourself. Try to avoid being around people who are sick. This  medication may increase your risk to bruise or bleed. Call your care team if you notice any unusual bleeding. Be careful brushing or flossing your teeth or using a toothpick because you may get an infection or bleed more easily. If you have any dental work done, tell your dentist you are receiving this medication. Talk to your care team if you or your partner may be pregnant. Serious birth defects can occur if you take this medication during pregnancy and for 6 months after the last dose. You will need a negative pregnancy test before starting this medication. Contraception is recommended while taking this medication and for 6 months after the last dose. Your care team can help you find the option that works for you. If your partner can get pregnant, use a condom during sex while taking this medication and for 3 months after the last dose. Do not breastfeed while taking this medication and for 2 weeks after the last dose. This medication may cause infertility. Talk to your care team if you are concerned about your fertility. What side effects may I notice from receiving this medication? Side effects that you should report to your care team as soon as possible: Allergic reactions--skin rash, itching, hives, swelling of the face, lips, tongue, or throat Dry cough,   shortness of breath or trouble breathing Infection--fever, chills, cough, sore throat, wounds that don't heal, pain or trouble when passing urine, general feeling of discomfort or being unwell Low red blood cell level--unusual weakness or fatigue, dizziness, headache, trouble breathing Pain, tingling, or numbness in the hands or feet Stomach pain, unusual weakness or fatigue, nausea, vomiting, diarrhea, or fever that lasts longer than expected Unusual bruising or bleeding Side effects that usually do not require medical attention (report to your care team if they continue or are bothersome): Diarrhea Fatigue Hair loss Loss of  appetite Nausea Vomiting This list may not describe all possible side effects. Call your doctor for medical advice about side effects. You may report side effects to FDA at 1-800-FDA-1088. Where should I keep my medication? This medication is given in a hospital or clinic. It will not be stored at home. NOTE: This sheet is a summary. It may not cover all possible information. If you have questions about this medicine, talk to your doctor, pharmacist, or health care provider.  2024 Elsevier/Gold Standard (2021-11-05 00:00:00)  Gemcitabine Injection What is this medication? GEMCITABINE (jem SYE ta been) treats some types of cancer. It works by slowing down the growth of cancer cells. This medicine may be used for other purposes; ask your health care provider or pharmacist if you have questions. COMMON BRAND NAME(S): Gemzar, Infugem What should I tell my care team before I take this medication? They need to know if you have any of these conditions: Blood disorders Infection Kidney disease Liver disease Lung or breathing disease, such as asthma or COPD Recent or ongoing radiation therapy An unusual or allergic reaction to gemcitabine, other medications, foods, dyes, or preservatives If you or your partner are pregnant or trying to get pregnant Breast-feeding How should I use this medication? This medication is injected into a vein. It is given by your care team in a hospital or clinic setting. Talk to your care team about the use of this medication in children. Special care may be needed. Overdosage: If you think you have taken too much of this medicine contact a poison control center or emergency room at once. NOTE: This medicine is only for you. Do not share this medicine with others. What if I miss a dose? Keep appointments for follow-up doses. It is important not to miss your dose. Call your care team if you are unable to keep an appointment. What may interact with this  medication? Interactions have not been studied. This list may not describe all possible interactions. Give your health care provider a list of all the medicines, herbs, non-prescription drugs, or dietary supplements you use. Also tell them if you smoke, drink alcohol, or use illegal drugs. Some items may interact with your medicine. What should I watch for while using this medication? Your condition will be monitored carefully while you are receiving this medication. This medication may make you feel generally unwell. This is not uncommon, as chemotherapy can affect healthy cells as well as cancer cells. Report any side effects. Continue your course of treatment even though you feel ill unless your care team tells you to stop. In some cases, you may be given additional medications to help with side effects. Follow all directions for their use. This medication may increase your risk of getting an infection. Call your care team for advice if you get a fever, chills, sore throat, or other symptoms of a cold or flu. Do not treat yourself. Try to avoid being around   people who are sick. This medication may increase your risk to bruise or bleed. Call your care team if you notice any unusual bleeding. Be careful brushing or flossing your teeth or using a toothpick because you may get an infection or bleed more easily. If you have any dental work done, tell your dentist you are receiving this medication. Avoid taking medications that contain aspirin, acetaminophen, ibuprofen, naproxen, or ketoprofen unless instructed by your care team. These medications may hide a fever. Talk to your care team if you or your partner wish to become pregnant or think you might be pregnant. This medication can cause serious birth defects if taken during pregnancy and for 6 months after the last dose. A negative pregnancy test is required before starting this medication. A reliable form of contraception is recommended while taking  this medication and for 6 months after the last dose. Talk to your care team about effective forms of contraception. Do not father a child while taking this medication and for 3 months after the last dose. Use a condom while having sex during this time period. Do not breastfeed while taking this medication and for at least 1 week after the last dose. This medication may cause infertility. Talk to your care team if you are concerned about your fertility. What side effects may I notice from receiving this medication? Side effects that you should report to your care team as soon as possible: Allergic reactions--skin rash, itching, hives, swelling of the face, lips, tongue, or throat Capillary leak syndrome--stomach or muscle pain, unusual weakness or fatigue, feeling faint or lightheaded, decrease in the amount of urine, swelling of the ankles, hands, or feet, trouble breathing Infection--fever, chills, cough, sore throat, wounds that don't heal, pain or trouble when passing urine, general feeling of discomfort or being unwell Liver injury--right upper belly pain, loss of appetite, nausea, light-colored stool, dark yellow or brown urine, yellowing skin or eyes, unusual weakness or fatigue Low red blood cell level--unusual weakness or fatigue, dizziness, headache, trouble breathing Lung injury--shortness of breath or trouble breathing, cough, spitting up blood, chest pain, fever Stomach pain, bloody diarrhea, pale skin, unusual weakness or fatigue, decrease in the amount of urine, which may be signs of hemolytic uremic syndrome Sudden and severe headache, confusion, change in vision, seizures, which may be signs of posterior reversible encephalopathy syndrome (PRES) Unusual bruising or bleeding Side effects that usually do not require medical attention (report to your care team if they continue or are bothersome): Diarrhea Drowsiness Hair loss Nausea Pain, redness, or swelling with sores inside the  mouth or throat Vomiting This list may not describe all possible side effects. Call your doctor for medical advice about side effects. You may report side effects to FDA at 1-800-FDA-1088. Where should I keep my medication? This medication is given in a hospital or clinic. It will not be stored at home. NOTE: This sheet is a summary. It may not cover all possible information. If you have questions about this medicine, talk to your doctor, pharmacist, or health care provider.  2024 Elsevier/Gold Standard (2021-10-27 00:00:00)  

## 2023-02-04 NOTE — Patient Instructions (Signed)

## 2023-02-04 NOTE — Telephone Encounter (Signed)
Transition Care Management Follow-up Telephone Call Date of discharge and from where: [facility] 01/30/2023 How have you been since you were released from the hospital? [facility] not much better  Any questions or concerns? No patient aware of upcoming procedures and had no needs surrounding transportation or medication  Items Reviewed: Did the pt receive and understand the discharge instructions provided? No  Medications obtained and verified? No  Other? No  Any new allergies since your discharge? No  Dietary orders reviewed? No Do you have support at home? Yes      Follow up appointments reviewed:  PCP Hospital f/u appt confirmed? Yes   Are transportation arrangements needed? No  If their condition worsens, is the pt aware to call PCP or go to the Emergency Dept.? Yes Was the patient provided with contact information for the PCP's office or ED? Yes Was to pt encouraged to call back with questions or concerns? Yes

## 2023-02-07 ENCOUNTER — Ambulatory Visit (HOSPITAL_COMMUNITY): Payer: Self-pay | Admitting: Certified Registered"

## 2023-02-07 ENCOUNTER — Encounter (HOSPITAL_COMMUNITY): Payer: Self-pay | Admitting: Gastroenterology

## 2023-02-07 ENCOUNTER — Ambulatory Visit (HOSPITAL_COMMUNITY): Payer: Medicare Other

## 2023-02-07 ENCOUNTER — Other Ambulatory Visit: Payer: Self-pay

## 2023-02-07 ENCOUNTER — Encounter (HOSPITAL_COMMUNITY)
Admission: RE | Disposition: A | Discharge status: Home / Self Care | Payer: Self-pay | Source: Home / Self Care | Attending: Gastroenterology

## 2023-02-07 ENCOUNTER — Ambulatory Visit (HOSPITAL_BASED_OUTPATIENT_CLINIC_OR_DEPARTMENT_OTHER): Payer: Medicare Other | Admitting: Certified Registered"

## 2023-02-07 ENCOUNTER — Ambulatory Visit (HOSPITAL_COMMUNITY)
Admission: RE | Admit: 2023-02-07 | Discharge: 2023-02-07 | Disposition: A | Discharge status: Home / Self Care | Payer: Medicare Other | Attending: Gastroenterology | Admitting: Gastroenterology

## 2023-02-07 DIAGNOSIS — C250 Malignant neoplasm of head of pancreas: Secondary | ICD-10-CM | POA: Insufficient documentation

## 2023-02-07 DIAGNOSIS — I2510 Atherosclerotic heart disease of native coronary artery without angina pectoris: Secondary | ICD-10-CM | POA: Insufficient documentation

## 2023-02-07 DIAGNOSIS — T85590A Other mechanical complication of bile duct prosthesis, initial encounter: Secondary | ICD-10-CM | POA: Diagnosis not present

## 2023-02-07 DIAGNOSIS — I4891 Unspecified atrial fibrillation: Secondary | ICD-10-CM | POA: Diagnosis not present

## 2023-02-07 DIAGNOSIS — I509 Heart failure, unspecified: Secondary | ICD-10-CM | POA: Insufficient documentation

## 2023-02-07 DIAGNOSIS — I110 Hypertensive heart disease with heart failure: Secondary | ICD-10-CM | POA: Diagnosis not present

## 2023-02-07 DIAGNOSIS — T85858A Stenosis due to other internal prosthetic devices, implants and grafts, initial encounter: Secondary | ICD-10-CM | POA: Insufficient documentation

## 2023-02-07 DIAGNOSIS — I482 Chronic atrial fibrillation, unspecified: Secondary | ICD-10-CM | POA: Diagnosis not present

## 2023-02-07 DIAGNOSIS — G4733 Obstructive sleep apnea (adult) (pediatric): Secondary | ICD-10-CM | POA: Diagnosis not present

## 2023-02-07 DIAGNOSIS — Z4659 Encounter for fitting and adjustment of other gastrointestinal appliance and device: Secondary | ICD-10-CM

## 2023-02-07 DIAGNOSIS — Z950 Presence of cardiac pacemaker: Secondary | ICD-10-CM | POA: Diagnosis not present

## 2023-02-07 DIAGNOSIS — Y732 Prosthetic and other implants, materials and accessory gastroenterology and urology devices associated with adverse incidents: Secondary | ICD-10-CM | POA: Insufficient documentation

## 2023-02-07 DIAGNOSIS — K838 Other specified diseases of biliary tract: Secondary | ICD-10-CM

## 2023-02-07 DIAGNOSIS — Z87891 Personal history of nicotine dependence: Secondary | ICD-10-CM | POA: Insufficient documentation

## 2023-02-07 DIAGNOSIS — K8051 Calculus of bile duct without cholangitis or cholecystitis with obstruction: Secondary | ICD-10-CM

## 2023-02-07 DIAGNOSIS — K831 Obstruction of bile duct: Secondary | ICD-10-CM | POA: Diagnosis not present

## 2023-02-07 DIAGNOSIS — Z7901 Long term (current) use of anticoagulants: Secondary | ICD-10-CM | POA: Insufficient documentation

## 2023-02-07 DIAGNOSIS — K8689 Other specified diseases of pancreas: Secondary | ICD-10-CM

## 2023-02-07 DIAGNOSIS — Z4682 Encounter for fitting and adjustment of non-vascular catheter: Secondary | ICD-10-CM | POA: Diagnosis not present

## 2023-02-07 HISTORY — PX: BILIARY STENT PLACEMENT: SHX5538

## 2023-02-07 HISTORY — PX: REMOVAL OF STONES: SHX5545

## 2023-02-07 HISTORY — PX: ENDOSCOPIC RETROGRADE CHOLANGIOPANCREATOGRAPHY (ERCP) WITH PROPOFOL: SHX5810

## 2023-02-07 HISTORY — PX: STENT REMOVAL: SHX6421

## 2023-02-07 SURGERY — ENDOSCOPIC RETROGRADE CHOLANGIOPANCREATOGRAPHY (ERCP) WITH PROPOFOL
Anesthesia: General

## 2023-02-07 MED ORDER — SODIUM CHLORIDE 0.9 % IV SOLN
INTRAVENOUS | Status: DC | PRN
  Administered 2023-02-07: 30 mL

## 2023-02-07 MED ORDER — ONDANSETRON HCL 4 MG/2ML IJ SOLN
INTRAMUSCULAR | Status: DC | PRN
Start: 2023-02-07 — End: 2023-02-07
  Administered 2023-02-07: 4 mg via INTRAVENOUS

## 2023-02-07 MED ORDER — PHENYLEPHRINE 80 MCG/ML (10ML) SYRINGE FOR IV PUSH (FOR BLOOD PRESSURE SUPPORT)
PREFILLED_SYRINGE | INTRAVENOUS | Status: DC | PRN
  Administered 2023-02-07: 160 ug via INTRAVENOUS

## 2023-02-07 MED ORDER — PROPOFOL 10 MG/ML IV BOLUS
INTRAVENOUS | Status: DC | PRN
Start: 2023-02-07 — End: 2023-02-07
  Administered 2023-02-07: 120 mg via INTRAVENOUS

## 2023-02-07 MED ORDER — DICLOFENAC SUPPOSITORY 100 MG
RECTAL | Status: DC | PRN
  Administered 2023-02-07: 100 mg via RECTAL

## 2023-02-07 MED ORDER — PROPOFOL 10 MG/ML IV BOLUS
INTRAVENOUS | Status: AC
  Filled 2023-02-07: qty 20

## 2023-02-07 MED ORDER — DICLOFENAC SUPPOSITORY 100 MG: 100.0000 mg | Freq: Once | RECTAL | Status: DC

## 2023-02-07 MED ORDER — FENTANYL CITRATE (PF) 100 MCG/2ML IJ SOLN
INTRAMUSCULAR | Status: DC | PRN
  Administered 2023-02-07: 25 ug via INTRAVENOUS

## 2023-02-07 MED ORDER — DEXAMETHASONE SODIUM PHOSPHATE 10 MG/ML IJ SOLN
INTRAMUSCULAR | Status: DC | PRN
  Administered 2023-02-07: 4 mg via INTRAVENOUS

## 2023-02-07 MED ORDER — SODIUM CHLORIDE 0.9 % IV SOLN: INTRAVENOUS | Status: DC

## 2023-02-07 MED ORDER — CIPROFLOXACIN IN D5W 400 MG/200ML IV SOLN
400.0000 mg | Freq: Once | INTRAVENOUS | Status: AC
  Administered 2023-02-07: 400 mg via INTRAVENOUS

## 2023-02-07 MED ORDER — FENTANYL CITRATE (PF) 100 MCG/2ML IJ SOLN
INTRAMUSCULAR | Status: AC
  Filled 2023-02-07: qty 2

## 2023-02-07 MED ORDER — LIDOCAINE 2% (20 MG/ML) 5 ML SYRINGE
INTRAMUSCULAR | Status: DC | PRN
  Administered 2023-02-07: 80 mg via INTRAVENOUS

## 2023-02-07 MED ORDER — GLUCAGON HCL RDNA (DIAGNOSTIC) 1 MG IJ SOLR
INTRAMUSCULAR | Status: DC | PRN
  Administered 2023-02-07: .25 mg via INTRAVENOUS

## 2023-02-07 MED ORDER — ROCURONIUM BROMIDE 10 MG/ML (PF) SYRINGE
PREFILLED_SYRINGE | INTRAVENOUS | Status: DC | PRN
  Administered 2023-02-07: 60 mg via INTRAVENOUS

## 2023-02-07 MED ORDER — SUGAMMADEX SODIUM 200 MG/2ML IV SOLN
INTRAVENOUS | Status: DC | PRN
  Administered 2023-02-07: 200 mg via INTRAVENOUS

## 2023-02-07 MED ORDER — IOPAMIDOL (ISOVUE-300) INJECTION 61%: INTRAVENOUS | Status: DC | PRN

## 2023-02-07 MED ORDER — APIXABAN 5 MG PO TABS: 5.0000 mg | ORAL_TABLET | Freq: Two times a day (BID) | ORAL | Status: DC

## 2023-02-07 MED ORDER — LACTATED RINGERS IV SOLN: INTRAVENOUS | Status: DC

## 2023-02-07 NOTE — Anesthesia Procedure Notes (Signed)
Procedure Name: Intubation Date/Time: 02/07/2023 12:40 PM  Performed by: Sindy Guadeloupe, CRNAPre-anesthesia Checklist: Patient identified, Emergency Drugs available, Suction available, Patient being monitored and Timeout performed Patient Re-evaluated:Patient Re-evaluated prior to induction Oxygen Delivery Method: Circle system utilized Preoxygenation: Pre-oxygenation with 100% oxygen Induction Type: IV induction Ventilation: Mask ventilation without difficulty Laryngoscope Size: Mac and 4 Grade View: Grade I Tube type: Oral Tube size: 7.5 mm Number of attempts: 1 Airway Equipment and Method: Stylet Placement Confirmation: ETT inserted through vocal cords under direct vision, positive ETCO2 and breath sounds checked- equal and bilateral Secured at: 22 cm Tube secured with: Tape Dental Injury: Teeth and Oropharynx as per pre-operative assessment

## 2023-02-07 NOTE — Discharge Instructions (Signed)
YOU HAD AN ENDOSCOPIC PROCEDURE TODAY: Refer to the procedure report and other information in the discharge instructions given to you for any specific questions about what was found during the examination. If this information does not answer your questions, please call [facility] office at [phone] to clarify.   YOU SHOULD EXPECT: Some feelings of bloating in the abdomen. Passage of more gas than usual. Walking can help get rid of the air that was put into your GI tract during the procedure and reduce the bloating. If you had a lower endoscopy (such as a colonoscopy or flexible sigmoidoscopy) you may notice spotting of blood in your stool or on the toilet paper. Some abdominal soreness may be present for a day or two, also.  DIET: Your first meal following the procedure should be a light meal and then it is ok to progress to your normal diet. A half-sandwich or bowl of soup is an example of a good first meal. Heavy or fried foods are harder to digest and may make you feel nauseous or bloated. Drink plenty of fluids but you should avoid alcoholic beverages for 24 hours. If you had a esophageal dilation, please see attached instructions for diet.    ACTIVITY: Your care partner should take you home directly after the procedure. You should plan to take it easy, moving slowly for the rest of the day. You can resume normal activity the day after the procedure however YOU SHOULD NOT DRIVE, use power tools, machinery or perform tasks that involve climbing or major physical exertion for 24 hours (because of the sedation medicines used during the test).   SYMPTOMS TO REPORT IMMEDIATELY: A gastroenterologist can be reached at any hour. Please call [phone]  for any of the following symptoms:   Following upper endoscopy (EGD, EUS, ERCP, esophageal dilation) Vomiting of blood or coffee ground material  New, significant abdominal pain  New, significant chest pain or pain under the shoulder blades  Painful or  persistently difficult swallowing  New shortness of breath  Black, tarry-looking or red, bloody stools  FOLLOW UP:  If any biopsies were taken you will be contacted by phone or by letter within the next 1-3 weeks. Call [phone]  if you have not heard about the biopsies in 3 weeks.  Please also call with any specific questions about appointments or follow up tests.  

## 2023-02-07 NOTE — H&P (Signed)
GASTROENTEROLOGY PROCEDURE H&P NOTE   Primary Care Physician: [name], NP  HPI: Marcus [name] is a 82 y.o. male who presents for ERCP for biliary stent exchange in setting of adenocarcinoma.  Past Medical History:  Diagnosis Date   Acute meniscal tear of knee left   Arthritis    generalized   AV block 08/01/2018   Benign essential hypertension 01/18/2007   Bronchiectasis with acute exacerbation 06/23/2020   Chest pain, atypical 08/23/2014   Chronic cough 10/07/2014   Coronary artery disease    Degeneration of lumbar intervertebral disc 10/14/2017   Diverticulosis of colon 07/07/2005   Elevated PSA 06/19/2014   GERD (gastroesophageal reflux disease) 01/18/2007   Gout, unspecified 01/18/2007   Hemorrhoids 01/19/2011   Hiatal hernia    History of colonic polyps 01/18/2007   Insomnia, unspecified 08/21/2007   Iron deficiency anemia, unspecified  01/19/2011   Lumbar post-laminectomy syndrome 10/14/2017   Lumbar spondylolysis    Lumbar strain 12/14/2011   Obstructive sleep apnea 01/18/2007   uses CPAP nightly   Paraesophageal hernia    large   Primary osteoarthritis of knee 05/23/2015   Prostate cancer (HCC) 2016   S/P knee replacement 05/23/2015   Sensorineural hearing loss, bilateral    Vitamin B12 deficiency    Vitamin D deficiency 10/28/2009   Past Surgical History:  Procedure Laterality Date   BILIARY BRUSHING  12/13/2022   Procedure: BILIARY BRUSHING;  Surgeon: Iva Boop, MD;  Location: [facility] ENDOSCOPY;  Service: Gastroenterology;;   BILIARY STENT PLACEMENT  12/13/2022   Procedure: BILIARY STENT PLACEMENT;  Surgeon: Iva Boop, MD;  Location: [facility] ENDOSCOPY;  Service: Gastroenterology;;   BIOPSY  12/23/2022   Procedure: BIOPSY;  Surgeon: Lemar Lofty., MD;  Location: [facility] ENDOSCOPY;  Service: Gastroenterology;;   BRONCHIAL WASHINGS  10/30/2019   Procedure: BRONCHIAL WASHINGS;  Surgeon: [name], DO;  Location: [facility] ENDOSCOPY;  Service:  Endoscopy;;   CARDIAC CATHETERIZATION     CATARACT EXTRACTION W/ INTRAOCULAR LENS  IMPLANT, BILATERAL Bilateral    COLONOSCOPY     ERCP N/A 12/13/2022   Procedure: ENDOSCOPIC RETROGRADE CHOLANGIOPANCREATOGRAPHY (ERCP);  Surgeon: Iva Boop, MD;  Location: [facility] ENDOSCOPY;  Service: Gastroenterology;  Laterality: N/A;  with stent placement   ESOPHAGOGASTRODUODENOSCOPY     ESOPHAGOGASTRODUODENOSCOPY (EGD) WITH PROPOFOL N/A 12/23/2022   Procedure: ESOPHAGOGASTRODUODENOSCOPY (EGD) WITH PROPOFOL;  Surgeon: Meridee Score Netty Starring., MD;  Location: [facility] ENDOSCOPY;  Service: Gastroenterology;  Laterality: N/A;   EUS N/A 12/23/2022   Procedure: UPPER ENDOSCOPIC ULTRASOUND (EUS) RADIAL;  Surgeon: Lemar Lofty., MD;  Location: [facility] ENDOSCOPY;  Service: Gastroenterology;  Laterality: N/A;   FINE NEEDLE ASPIRATION  12/23/2022   Procedure: FINE NEEDLE ASPIRATION (FNA) RADIAL;  Surgeon: Lemar Lofty., MD;  Location: [facility] ENDOSCOPY;  Service: Gastroenterology;;   IR IMAGING GUIDED PORT INSERTION  01/04/2023   KNEE ARTHROSCOPY Right 2007   KNEE ARTHROSCOPY  07/13/2011   Procedure: ARTHROSCOPY KNEE;  Surgeon: Erasmo Leventhal;  Location: [facility];  Service: Orthopedics;  Laterality: Left;  partial menisectomy with chondrylplasty   KNEE SURGERY Left    LAMINECTOMY AND MICRODISCECTOMY LUMBAR SPINE  MARCH  2008   L3 -  4   LAPAROSCOPIC INCISIONAL / UMBILICAL / VENTRAL HERNIA REPAIR  2006   PACEMAKER IMPLANT N/A 08/02/2018   Procedure: PACEMAKER IMPLANT;  Surgeon: Marinus Maw, MD;  Location: [facility] INVASIVE CV LAB;  Service: Cardiovascular;  Laterality: N/A;   PROSTATE BIOPSY     RADIOACTIVE  SEED IMPLANT N/A 02/12/2019   Procedure: RADIOACTIVE SEED IMPLANT/BRACHYTHERAPY IMPLANT;  Surgeon: Marcine Matar, MD;  Location: [facility] ORS;  Service: Urology;  Laterality: N/A;  90 MINS   SHOULDER ARTHROSCOPY Right 05-23-2007   SIGMOID COLECTOMY FOR CANCER  1989   SPACE OAR INSTILLATION N/A  02/12/2019   Procedure: SPACE OAR INSTILLATION;  Surgeon: Marcine Matar, MD;  Location: [facility] ORS;  Service: Urology;  Laterality: N/A;   SPHINCTEROTOMY  12/13/2022   Procedure: SPHINCTEROTOMY;  Surgeon: Iva Boop, MD;  Location: [facility] ENDOSCOPY;  Service: Gastroenterology;;   TOTAL KNEE ARTHROPLASTY Left 05/23/2015   Procedure: LEFT TOTAL KNEE ARTHROPLASTY;  Surgeon: Eugenia Mcalpine, MD;  Location: [facility] ORS;  Service: Orthopedics;  Laterality: Left;   VERTICAL BANDED GASTROPLASTY  1986   VIDEO BRONCHOSCOPY N/A 10/30/2019   Procedure: VIDEO BRONCHOSCOPY WITH BRONICAL ALVEROLAR LAVAGE WITHOUT FLUORO;  Surgeon: [name], DO;  Location: [facility] ENDOSCOPY;  Service: Endoscopy;  Laterality: N/A;   No current facility-administered medications for this encounter.   No current facility-administered medications for this encounter. No Active Allergies Family History  Problem Relation Age of Onset   Stroke Paternal Grandfather    Heart disease Paternal Grandfather    Esophagitis Father        died from perforated esophagus   Breast cancer Mother    Colon cancer Maternal Grandmother        questionable   Esophageal cancer Neg Hx    Prostate cancer Neg Hx    Stomach cancer Neg Hx    Social History   Socioeconomic History   Marital status: Widowed    Spouse name: Not on file   Number of children: 4   Years of education: 25   Highest education level: Some college, no degree  Occupational History   Occupation: retired    Associate Professor: [employer]  Tobacco Use   Smoking status: Former    Current packs/day: 0.00    Average packs/day: 2.0 packs/day for 20.0 years (40.0 ttl pk-yrs)    Types: Cigarettes    Start date: 07/05/1954    Quit date: 07/05/1974    Years since quitting: 48.6   Smokeless tobacco: Never  Vaping Use   Vaping status: Never Used  Substance and Sexual Activity   Alcohol use: Not Currently   Drug use: No   Sexual activity: Not Currently  Other Topics Concern   Not on  file  Social History Narrative   Recently widowed   Former Smoker    Alcohol use-yes 1-2 drinks per day      Occupation: Retired Associate Professor      Originally from [place], [place] - in [place] > 10 yrs as of 2017      Social Determinants of Health   Financial Resource Strain: Low Risk  (07/28/2021)   Overall Financial Resource Strain (CARDIA)    Difficulty of Paying Living Expenses: Not hard at all  Food Insecurity: No Food Insecurity (01/14/2023)   Hunger Vital Sign    Worried About Running Out of Food in the Last Year: Never true    Ran Out of Food in the Last Year: Never true  Transportation Needs: No Transportation Needs (01/14/2023)   PRAPARE - Administrator, Civil Service (Medical): No    Lack of Transportation (Non-Medical): No  Physical Activity: Inactive (07/28/2021)   Exercise Vital Sign    Days of Exercise per Week: 0 days    Minutes of Exercise per Session: 0 min  Stress: No  Stress Concern Present (07/28/2021)   [employer] of Occupational Health - Occupational Stress Questionnaire    Feeling of Stress : Not at all  Social Connections: Moderately Isolated (08/04/2020)   Social Connection and Isolation Panel [NHANES]    Frequency of Communication with Friends and Family: Three times a week    Frequency of Social Gatherings with Friends and Family: More than three times a week    Attends Religious Services: More than 4 times per year    Active Member of Clubs or Organizations: No    Attends Banker Meetings: Never    Marital Status: Widowed  Intimate Partner Violence: Not At Risk (01/10/2023)   Humiliation, Afraid, Rape, and Kick questionnaire    Fear of Current or Ex-Partner: No    Emotionally Abused: No    Physically Abused: No    Sexually Abused: No    Physical Exam: There were no vitals filed for this visit. There is no height or weight on file to calculate BMI. GEN: NAD EYE: Sclerae anicteric ENT: MMM CV: Non-tachycardic GI: Soft,  NT/ND NEURO:  Alert & Oriented x 3  Lab Results: Recent Labs    02/04/23 1150  WBC 4.9  HGB 8.7*  HCT 27.5*  PLT 221   BMET Recent Labs    02/04/23 1150  NA 142  K 3.6  CL 108  CO2 27  GLUCOSE 132*  BUN 26*  CREATININE 0.96  CALCIUM 8.6*   LFT Recent Labs    02/04/23 1150  PROT 6.3*  ALBUMIN 3.4*  AST 23  ALT 42  ALKPHOS 304*  BILITOT 0.5   PT/INR No results for input(s): "LABPROT", "INR" in the last 72 hours.   Impression / Plan: This is a 82 y.o.male who presents for ERCP for biliary stent exchange in setting of adenocarcinoma.  The risks of an ERCP were discussed at length, including but not limited to the risk of perforation, bleeding, abdominal pain, post-ERCP pancreatitis (while usually mild can be severe and even life threatening).   The risks and benefits of endoscopic evaluation/treatment were discussed with the patient and/or family; these include but are not limited to the risk of perforation, infection, bleeding, missed lesions, lack of diagnosis, severe illness requiring hospitalization, as well as anesthesia and sedation related illnesses.  The patient's history has been reviewed, patient examined, no change in status, and deemed stable for procedure.  The patient and/or family is agreeable to proceed.    Corliss Parish, MD [facility] Office # [phone]

## 2023-02-07 NOTE — Transfer of Care (Signed)
Immediate Anesthesia Transfer of Care Note  Patient: Marcus [name]  Procedure(s) Performed: ENDOSCOPIC RETROGRADE CHOLANGIOPANCREATOGRAPHY (ERCP) WITH PROPOFOL BILIARY STENT PLACEMENT STENT REMOVAL  Patient Location: PACU and Endoscopy Unit  Anesthesia Type:General  Level of Consciousness: awake, alert , and patient cooperative  Airway & Oxygen Therapy: Patient Spontanous Breathing and Patient connected to face mask oxygen  Post-op Assessment: Report given to RN and Post -op Vital signs reviewed and stable  Post vital signs: Reviewed and stable  Last Vitals:  Vitals Value Taken Time  BP 144/64   Temp    Pulse 64   Resp 16   SpO2 100     Last Pain:  Vitals:   02/07/23 1200  TempSrc: Temporal  PainSc: 0-No pain         Complications: No notable events documented.

## 2023-02-07 NOTE — Addendum Note (Signed)
Encounter addended by: Edward Qualia on: 02/07/2023 3:07 PM  Actions taken: Imaging Exam ended

## 2023-02-07 NOTE — Addendum Note (Signed)
Encounter addended by: Edward Qualia on: 02/07/2023 3:10 PM  Actions taken: Imaging Exam ended

## 2023-02-07 NOTE — Anesthesia Postprocedure Evaluation (Signed)
Anesthesia Post Note  Patient: JAQUARION SPOFFORD  Procedure(s) Performed: ENDOSCOPIC RETROGRADE CHOLANGIOPANCREATOGRAPHY (ERCP) WITH PROPOFOL BILIARY STENT PLACEMENT STENT REMOVAL REMOVAL OF Sludge     Patient location during evaluation: Endoscopy Anesthesia Type: General Level of consciousness: awake and alert Pain management: pain level controlled Vital Signs Assessment: post-procedure vital signs reviewed and stable Respiratory status: spontaneous breathing, nonlabored ventilation, respiratory function stable and patient connected to nasal cannula oxygen Cardiovascular status: blood pressure returned to baseline and stable Postop Assessment: no apparent nausea or vomiting Anesthetic complications: no   No notable events documented.  Last Vitals:  Vitals:   02/07/23 1352 02/07/23 1357  BP: (!) 137/52 133/60  Pulse: 64 64  Resp: 18   Temp:    SpO2: 100% 100%    Last Pain:  Vitals:   02/07/23 1357  TempSrc:   PainSc: 0-No pain                 Collene Schlichter

## 2023-02-07 NOTE — Op Note (Signed)
[facility] Patient Name: Marcus [name] Procedure Date: 02/07/2023 MRN: [id number] Attending MD: Corliss Parish , MD, [id number] Date of Birth: September 22, 1940 CSN: [id number] Age: 82 Admit Type: Outpatient Procedure:                ERCP Indications:              Malignant tumor of the head of pancreas, Stent                            change Providers:                Corliss Parish, MD, Eliberto Ivory, RN, Harrington Challenger, Technician Referring MD:             Leighton Roach. [place] Lao Nafziger Medicines:                General Anesthesia, Cipro 400 mg IV, Glucagon 0.5                            mg IV, Diclofenac 100 mg rectal Complications:            No immediate complications. Estimated Blood Loss:     Estimated blood loss was minimal. Procedure:                Pre-Anesthesia Assessment:                           - Prior to the procedure, a History and Physical                            was performed, and patient medications and                            allergies were reviewed. The patient's tolerance of                            previous anesthesia was also reviewed. The risks                            and benefits of the procedure and the sedation                            options and risks were discussed with the patient.                            All questions were answered, and informed consent                            was obtained. Prior Anticoagulants: The patient has                            taken Eliquis (apixaban), last dose was 2 days  prior to procedure. ASA Grade Assessment: III - A                            patient with severe systemic disease. After                            reviewing the risks and benefits, the patient was                            deemed in satisfactory condition to undergo the                            procedure.                           After obtaining informed consent,  the scope was                            passed under direct vision. Throughout the                            procedure, the patient's blood pressure, pulse, and                            oxygen saturations were monitored continuously. The                            [employer] D single use                            duodenoscope was introduced through the mouth, and                            used to inject contrast into and used to inject                            contrast into the bile duct. The ERCP was                            accomplished without difficulty. The patient                            tolerated the procedure.                           Unfortunately due to a processor issue, endoscopic                            images were not able to be saved. Scope In: Scope Out: Findings:      A biliary stent was visible on the scout film.      A biliary sphincterotomy had been performed. The sphincterotomy appeared       open. One plastic biliary stent originating in the biliary tree was       emerging from the  major papilla. The stent was partially occluded. One       stent was removed from the biliary tree using a Raptor grasping device.      A short 0.035 inch Soft Jagwire was passed into the biliary tree. The       Hydratome sphincterotome was passed over the guidewire and the bile duct       was then deeply cannulated. Contrast was injected. I personally       interpreted the bile duct images. Ductal flow of contrast was adequate.       Image quality was adequate. Contrast extended to the hepatic ducts.       Opacification of the entire biliary tree except for the cystic duct and       gallbladder was successful. The lower third of the main bile duct       contained a single moderate stenosis 40 mm in length. The middle third       of the main bile duct and upper third of the main bile duct were       moderately dilated, secondary to aforementioned  stricture. The largest       diameter was 18 mm. To discover objects, the biliary tree was swept with       a Hydratome sphincterotome. Sludge was swept from the duct. A few stone       fragments were removed. One [employer] 10 mm by 6 cm uncovered       metal biliary stent was placed into the common bile duct. The stent was       in good position with evidence of all of the biliary contrast having       left the duct after stent placement.      A pancreatogram was not performed.      The duodenoscope was withdrawn from the patient. Impression:               - Prior biliary sphincterotomy appeared open.                           - One partially occluded stent from the biliary                            tree was seen in the major papilla. This was                            removed.                           - A single moderate biliary stricture was found in                            the lower third of the main bile duct. The                            stricture was malignant appearing.                           - The upper third of the main bile duct and middle  third of the main bile duct were moderately                            dilated, secondary to aforementioned stricture.                           - Biliary sludge and was found. Complete removal                            was accomplished by sweeping.                           - One uncovered metal biliary stent was placed into                            the common bile duct to traverse the stenosis. Moderate Sedation:      Not Applicable - Patient had care per Anesthesia. Recommendation:           - The patient will be observed post-procedure,                            until all discharge criteria are met.                           - Discharge patient to home.                           - Patient has a contact number available for                            emergencies. The signs and symptoms of  potential                            delayed complications were discussed with the                            patient. Return to normal activities tomorrow.                            Written discharge instructions were provided to the                            patient.                           - Advance diet as tolerated.                           - May restart Eliquis tomorrow on 8/6 PM to                            decrease post interventional bleeding risk.                           - Continue present medications.                           -  Follow-up LFT pattern with patient's oncology                            group.                           - Watch for pancreatitis, bleeding, perforation,                            and cholangitis.                           - The findings and recommendations were discussed                            with the patient.                           - The findings and recommendations were discussed                            with the patient's family. Procedure Code(s):        --- Professional ---                           [phone], Endoscopic retrograde                            cholangiopancreatography (ERCP); with removal and                            exchange of stent(s), biliary or pancreatic duct,                            including pre- and post-dilation and guide wire                            passage, when performed, including sphincterotomy,                            when performed, each stent exchanged                           43264, Endoscopic retrograde                            cholangiopancreatography (ERCP); with removal of                            calculi/debris from biliary/pancreatic duct(s)                           74328, 26, Endoscopic catheterization of the                            biliary ductal system, radiological supervision and  interpretation Diagnosis Code(s):        --- Professional  ---                           T85.590A, Other mechanical complication of bile                            duct prosthesis, initial encounter                           K80.51, Calculus of bile duct without cholangitis                            or cholecystitis with obstruction                           Z46.59, Encounter for fitting and adjustment of                            other gastrointestinal appliance and device                           C25.0, Malignant neoplasm of head of pancreas                           K83.8, Other specified diseases of biliary tract CPT copyright 2022 [employer]. All rights reserved. The codes documented in this report are preliminary and upon coder review may  be revised to meet current compliance requirements. Corliss Parish, MD 02/07/2023 1:24:33 PM Number of Addenda: 0

## 2023-02-07 NOTE — Anesthesia Preprocedure Evaluation (Signed)
Anesthesia Evaluation  Patient identified by MRN, date of birth, ID band Patient awake    Reviewed: Allergy & Precautions, NPO status , Patient's Chart, lab work & pertinent test results  Airway Mallampati: II  TM Distance: >3 FB Neck ROM: Full    Dental  (+) Teeth Intact, Dental Advisory Given,    Pulmonary sleep apnea and Continuous Positive Airway Pressure Ventilation , COPD, former smoker   Pulmonary exam normal breath sounds clear to auscultation       Cardiovascular hypertension, Pt. on medications + CAD and +CHF  + dysrhythmias Atrial Fibrillation + pacemaker  Rhythm:Regular Rate:Normal     Neuro/Psych  Neuromuscular disease    GI/Hepatic hiatal hernia,GERD  ,, Pancreatic mass; stent change   Endo/Other  negative endocrine ROS    Renal/GU negative Renal ROS     Musculoskeletal  (+) Arthritis ,    Abdominal   Peds  Hematology  (+) Blood dyscrasia (Eliquis), anemia   Anesthesia Other Findings Day of surgery medications reviewed with the patient.  Reproductive/Obstetrics                              Anesthesia Physical Anesthesia Plan  ASA: 3  Anesthesia Plan: General   Post-op Pain Management: Minimal or no pain anticipated   Induction: Intravenous  PONV Risk Score and Plan: 2 and Dexamethasone and Ondansetron  Airway Management Planned: Oral ETT  Additional Equipment:   Intra-op Plan:   Post-operative Plan: Extubation in OR  Informed Consent: I have reviewed the patients History and Physical, chart, labs and discussed the procedure including the risks, benefits and alternatives for the proposed anesthesia with the patient or authorized representative who has indicated his/her understanding and acceptance.     Dental advisory given  Plan Discussed with: CRNA  Anesthesia Plan Comments:          Anesthesia Quick Evaluation

## 2023-02-07 NOTE — Addendum Note (Signed)
Encounter addended by: Edward Qualia on: 02/07/2023 3:13 PM  Actions taken: Imaging Exam ended

## 2023-02-09 ENCOUNTER — Inpatient Hospital Stay: Payer: No Typology Code available for payment source

## 2023-02-09 DIAGNOSIS — C259 Malignant neoplasm of pancreas, unspecified: Secondary | ICD-10-CM

## 2023-02-10 ENCOUNTER — Encounter (HOSPITAL_COMMUNITY): Payer: Self-pay | Admitting: Gastroenterology

## 2023-02-11 DIAGNOSIS — D649 Anemia, unspecified: Secondary | ICD-10-CM | POA: Insufficient documentation

## 2023-02-12 ENCOUNTER — Other Ambulatory Visit: Payer: Self-pay | Admitting: Oncology

## 2023-02-14 ENCOUNTER — Ambulatory Visit: Payer: Medicare Other

## 2023-02-14 DIAGNOSIS — I4430 Unspecified atrioventricular block: Secondary | ICD-10-CM | POA: Diagnosis not present

## 2023-02-15 ENCOUNTER — Encounter: Payer: Self-pay | Admitting: Adult Health

## 2023-02-15 ENCOUNTER — Ambulatory Visit (INDEPENDENT_AMBULATORY_CARE_PROVIDER_SITE_OTHER): Payer: Medicare Other | Admitting: Adult Health

## 2023-02-15 VITALS — BP 120/60 | HR 62 | Temp 97.9°F | Ht 68.0 in | Wt 211.0 lb

## 2023-02-15 DIAGNOSIS — G4733 Obstructive sleep apnea (adult) (pediatric): Secondary | ICD-10-CM

## 2023-02-15 DIAGNOSIS — R600 Localized edema: Secondary | ICD-10-CM | POA: Diagnosis not present

## 2023-02-15 DIAGNOSIS — I5042 Chronic combined systolic (congestive) and diastolic (congestive) heart failure: Secondary | ICD-10-CM

## 2023-02-15 DIAGNOSIS — K5900 Constipation, unspecified: Secondary | ICD-10-CM

## 2023-02-15 MED ORDER — DOCUSATE SODIUM 100 MG PO CAPS
100.0000 mg | ORAL_CAPSULE | Freq: Two times a day (BID) | ORAL | 1 refills | Status: AC
Start: 2023-02-15 — End: 2023-05-16

## 2023-02-15 MED ORDER — FUROSEMIDE 40 MG PO TABS
40.0000 mg | ORAL_TABLET | Freq: Every day | ORAL | 3 refills | Status: DC
Start: 2023-02-15 — End: 2023-03-09

## 2023-02-15 NOTE — Progress Notes (Signed)
Subjective:    Patient ID: Marcus [name], male    DOB: Aug 25, 1940, 82 y.o.   MRN: [id number]  HPI 82 year old male who  has a past medical history of Acute meniscal tear of knee (left), Arthritis, AV block (08/01/2018), Benign essential hypertension (01/18/2007), Bronchiectasis with acute exacerbation (06/23/2020), Chest pain, atypical (08/23/2014), Chronic cough (10/07/2014), Coronary artery disease, Degeneration of lumbar intervertebral disc (10/14/2017), Diverticulosis of colon (07/07/2005), Elevated PSA (06/19/2014), GERD (gastroesophageal reflux disease) (01/18/2007), Gout, unspecified (01/18/2007), Hemorrhoids (01/19/2011), Hiatal hernia, History of colonic polyps (01/18/2007), Insomnia, unspecified (08/21/2007), Iron deficiency anemia, unspecified  (01/19/2011), Lumbar post-laminectomy syndrome (10/14/2017), Lumbar spondylolysis, Lumbar strain (12/14/2011), Obstructive sleep apnea (01/18/2007), Paraesophageal hernia, Primary osteoarthritis of knee (05/23/2015), Prostate cancer (HCC) (2016), S/P knee replacement (05/23/2015), Sensorineural hearing loss, bilateral, Vitamin B12 deficiency, and Vitamin D deficiency (10/28/2009).  He presents to the office today to discuss his sleep study results.  He had a sleep study done through the [facility].  This sleep study showed severe obstructive sleep apnea with 43 apneas and hyperal apneas per hour with more severe when sleeping prone at 51.6 episodes an hour.  He does wear his CPAP nightly but reports that his machine is old.  He does see Dr. Vassie Loll with pulmonary.  More he has a history of congestive heart failure, has been out of his Lasix for the last few weeks and has noticed swelling in his lower extremities.  He denies shortness of breath  Furthermore he would like a prescription for Colace that he can take as needed for constipation   Review of Systems See HPI   Past Medical History:  Diagnosis Date   Acute meniscal tear of  knee left   Arthritis    generalized   AV block 08/01/2018   Benign essential hypertension 01/18/2007   Bronchiectasis with acute exacerbation 06/23/2020   Chest pain, atypical 08/23/2014   Chronic cough 10/07/2014   Coronary artery disease    Degeneration of lumbar intervertebral disc 10/14/2017   Diverticulosis of colon 07/07/2005   Elevated PSA 06/19/2014   GERD (gastroesophageal reflux disease) 01/18/2007   Gout, unspecified 01/18/2007   Hemorrhoids 01/19/2011   Hiatal hernia    History of colonic polyps 01/18/2007   Insomnia, unspecified 08/21/2007   Iron deficiency anemia, unspecified  01/19/2011   Lumbar post-laminectomy syndrome 10/14/2017   Lumbar spondylolysis    Lumbar strain 12/14/2011   Obstructive sleep apnea 01/18/2007   uses CPAP nightly   Paraesophageal hernia    large   Primary osteoarthritis of knee 05/23/2015   Prostate cancer (HCC) 2016   S/P knee replacement 05/23/2015   Sensorineural hearing loss, bilateral    Vitamin B12 deficiency    Vitamin D deficiency 10/28/2009    Social History   Socioeconomic History   Marital status: Widowed    Spouse name: Not on file   Number of children: 4   Years of education: 75   Highest education level: Some college, no degree  Occupational History   Occupation: retired    Associate Professor: [employer]  Tobacco Use   Smoking status: Former    Current packs/day: 0.00    Average packs/day: 2.0 packs/day for 20.0 years (40.0 ttl pk-yrs)    Types: Cigarettes    Start date: 07/05/1954    Quit date: 07/05/1974    Years since quitting: 48.6   Smokeless tobacco: Never  Vaping Use   Vaping status: Never Used  Substance and Sexual Activity   Alcohol use:  Not Currently   Drug use: No   Sexual activity: Not Currently  Other Topics Concern   Not on file  Social History Narrative   Recently widowed   Former Smoker    Alcohol use-yes 1-2 drinks per day      Occupation: Retired Associate Professor      Originally from [place],  [place] - in [place] > 10 yrs as of 2017      Social Determinants of Health   Financial Resource Strain: Low Risk  (07/28/2021)   Overall Financial Resource Strain (CARDIA)    Difficulty of Paying Living Expenses: Not hard at all  Food Insecurity: No Food Insecurity (01/14/2023)   Hunger Vital Sign    Worried About Running Out of Food in the Last Year: Never true    Ran Out of Food in the Last Year: Never true  Transportation Needs: No Transportation Needs (01/14/2023)   PRAPARE - Administrator, Civil Service (Medical): No    Lack of Transportation (Non-Medical): No  Physical Activity: Inactive (07/28/2021)   Exercise Vital Sign    Days of Exercise per Week: 0 days    Minutes of Exercise per Session: 0 min  Stress: No Stress Concern Present (07/28/2021)   [employer] of Occupational Health - Occupational Stress Questionnaire    Feeling of Stress : Not at all  Social Connections: Moderately Isolated (08/04/2020)   Social Connection and Isolation Panel [NHANES]    Frequency of Communication with Friends and Family: Three times a week    Frequency of Social Gatherings with Friends and Family: More than three times a week    Attends Religious Services: More than 4 times per year    Active Member of Clubs or Organizations: No    Attends Banker Meetings: Never    Marital Status: Widowed  Intimate Partner Violence: Not At Risk (01/10/2023)   Humiliation, Afraid, Rape, and Kick questionnaire    Fear of Current or Ex-Partner: No    Emotionally Abused: No    Physically Abused: No    Sexually Abused: No    Past Surgical History:  Procedure Laterality Date   BILIARY BRUSHING  12/13/2022   Procedure: BILIARY BRUSHING;  Surgeon: Iva Boop, MD;  Location: [facility] ENDOSCOPY;  Service: Gastroenterology;;   BILIARY STENT PLACEMENT  12/13/2022   Procedure: BILIARY STENT PLACEMENT;  Surgeon: Iva Boop, MD;  Location: [facility] ENDOSCOPY;  Service: Gastroenterology;;   BILIARY  STENT PLACEMENT N/A 02/07/2023   Procedure: BILIARY STENT PLACEMENT;  Surgeon: Lemar Lofty., MD;  Location: [facility] ENDOSCOPY;  Service: Gastroenterology;  Laterality: N/A;   BIOPSY  12/23/2022   Procedure: BIOPSY;  Surgeon: Lemar Lofty., MD;  Location: [facility] ENDOSCOPY;  Service: Gastroenterology;;   BRONCHIAL WASHINGS  10/30/2019   Procedure: BRONCHIAL WASHINGS;  Surgeon: [name], DO;  Location: [facility] ENDOSCOPY;  Service: Endoscopy;;   CARDIAC CATHETERIZATION     CATARACT EXTRACTION W/ INTRAOCULAR LENS  IMPLANT, BILATERAL Bilateral    COLONOSCOPY     ENDOSCOPIC RETROGRADE CHOLANGIOPANCREATOGRAPHY (ERCP) WITH PROPOFOL N/A 02/07/2023   Procedure: ENDOSCOPIC RETROGRADE CHOLANGIOPANCREATOGRAPHY (ERCP) WITH PROPOFOL;  Surgeon: Lemar Lofty., MD;  Location: [facility] ENDOSCOPY;  Service: Gastroenterology;  Laterality: N/A;  Stent change   ERCP N/A 12/13/2022   Procedure: ENDOSCOPIC RETROGRADE CHOLANGIOPANCREATOGRAPHY (ERCP);  Surgeon: Iva Boop, MD;  Location: [facility] ENDOSCOPY;  Service: Gastroenterology;  Laterality: N/A;  with stent placement   ESOPHAGOGASTRODUODENOSCOPY     ESOPHAGOGASTRODUODENOSCOPY (EGD) WITH PROPOFOL N/A  12/23/2022   Procedure: ESOPHAGOGASTRODUODENOSCOPY (EGD) WITH PROPOFOL;  Surgeon: Meridee Score Netty Starring., MD;  Location: [facility] ENDOSCOPY;  Service: Gastroenterology;  Laterality: N/A;   EUS N/A 12/23/2022   Procedure: UPPER ENDOSCOPIC ULTRASOUND (EUS) RADIAL;  Surgeon: Lemar Lofty., MD;  Location: [facility] ENDOSCOPY;  Service: Gastroenterology;  Laterality: N/A;   FINE NEEDLE ASPIRATION  12/23/2022   Procedure: FINE NEEDLE ASPIRATION (FNA) RADIAL;  Surgeon: Lemar Lofty., MD;  Location: [facility] ENDOSCOPY;  Service: Gastroenterology;;   IR IMAGING GUIDED PORT INSERTION  01/04/2023   KNEE ARTHROSCOPY Right 2007   KNEE ARTHROSCOPY  07/13/2011   Procedure: ARTHROSCOPY KNEE;  Surgeon: Erasmo Leventhal;  Location: [facility];  Service:  Orthopedics;  Laterality: Left;  partial menisectomy with chondrylplasty   KNEE SURGERY Left    LAMINECTOMY AND MICRODISCECTOMY LUMBAR SPINE  MARCH  2008   L3 -  4   LAPAROSCOPIC INCISIONAL / UMBILICAL / VENTRAL HERNIA REPAIR  2006   PACEMAKER IMPLANT N/A 08/02/2018   Procedure: PACEMAKER IMPLANT;  Surgeon: Marinus Maw, MD;  Location: [facility];  Service: Cardiovascular;  Laterality: N/A;   PROSTATE BIOPSY     RADIOACTIVE SEED IMPLANT N/A 02/12/2019   Procedure: RADIOACTIVE SEED IMPLANT/BRACHYTHERAPY IMPLANT;  Surgeon: Marcine Matar, MD;  Location: [facility] ORS;  Service: Urology;  Laterality: N/A;  90 MINS   REMOVAL OF STONES  02/07/2023   Procedure: REMOVAL OF Sludge;  Surgeon: Meridee Score Netty Starring., MD;  Location: [facility] ENDOSCOPY;  Service: Gastroenterology;;   SHOULDER ARTHROSCOPY Right 05-23-2007   SIGMOID COLECTOMY FOR CANCER  1989   SPACE OAR INSTILLATION N/A 02/12/2019   Procedure: SPACE OAR INSTILLATION;  Surgeon: Marcine Matar, MD;  Location: [facility] ORS;  Service: Urology;  Laterality: N/A;   SPHINCTEROTOMY  12/13/2022   Procedure: SPHINCTEROTOMY;  Surgeon: Iva Boop, MD;  Location: [facility] ENDOSCOPY;  Service: Gastroenterology;;   Francine Graven REMOVAL  02/07/2023   Procedure: STENT REMOVAL;  Surgeon: Lemar Lofty., MD;  Location: [facility] ENDOSCOPY;  Service: Gastroenterology;;   TOTAL KNEE ARTHROPLASTY Left 05/23/2015   Procedure: LEFT TOTAL KNEE ARTHROPLASTY;  Surgeon: Eugenia Mcalpine, MD;  Location: [facility] ORS;  Service: Orthopedics;  Laterality: Left;   VERTICAL BANDED GASTROPLASTY  1986   VIDEO BRONCHOSCOPY N/A 10/30/2019   Procedure: VIDEO BRONCHOSCOPY WITH BRONICAL ALVEROLAR LAVAGE WITHOUT FLUORO;  Surgeon: [name], DO;  Location: [facility] ENDOSCOPY;  Service: Endoscopy;  Laterality: N/A;    Family History  Problem Relation Age of Onset   Stroke Paternal Grandfather    Heart disease Paternal Grandfather    Esophagitis Father        died from perforated esophagus    Breast cancer Mother    Colon cancer Maternal Grandmother        questionable   Esophageal cancer Neg Hx    Prostate cancer Neg Hx    Stomach cancer Neg Hx     No Active Allergies  Current Outpatient Medications on File Prior to Visit  Medication Sig Dispense Refill   acetaminophen (TYLENOL) 325 MG tablet Take 2 tablets (650 mg total) by mouth every 6 (six) hours as needed for mild pain (or Fever >/= 101). 20 tablet 0   allopurinol (ZYLOPRIM) 300 MG tablet Take 1 tablet (300 mg total) by mouth daily. 90 tablet 3   amLODipine (NORVASC) 10 MG tablet Take 1 tablet (10 mg total) by mouth daily. 90 tablet 3   apixaban (ELIQUIS) 5 MG TABS tablet Take 1 tablet (5 mg total) by  mouth 2 (two) times daily.     Calcium Carb-Cholecalciferol (CALCIUM 600 + D PO) Take 1 tablet by mouth daily. 800 units of vitamin D     Eszopiclone 3 MG TABS Take 1 tablet (3 mg total) by mouth at bedtime. Take immediately before bedtime 30 tablet 2   guaiFENesin (MUCINEX) 600 MG 12 hr tablet Take 600 mg by mouth 2 (two) times daily as needed for cough.     hydrOXYzine (ATARAX) 50 MG tablet Take 1 tablet (50 mg total) by mouth 3 (three) times daily as needed. 270 tablet 1   lidocaine-prilocaine (EMLA) cream Apply 1 Application topically as needed. 30 g 0   Multiple Vitamin (MULTIVITAMIN) tablet Take 1 tablet by mouth in the morning.     polyethylene glycol powder (GLYCOLAX/MIRALAX) 17 GM/SCOOP powder Take 17g (1 capful) by mouth as directed daily as needed for mild constipation. 238 g 0   potassium chloride SA (KLOR-CON M) 20 MEQ tablet Take 1 tablet (20 mEq total) by mouth daily. 30 tablet 0   Respiratory Therapy Supplies (FLUTTER) DEVI 1 each by Does not apply route in the morning and at bedtime. 1 each 0   sodium chloride HYPERTONIC 3 % nebulizer solution Take by nebulization as needed for other. (Patient taking differently: Take 4 mLs by nebulization as needed for other.) 750 mL 12   sucralfate (CARAFATE) 1 GM/10ML  suspension Take 10 mLs (1 g total) by mouth 2 (two) times daily. 420 mL 2   tamsulosin (FLOMAX) 0.4 MG CAPS capsule Take 0.4 mg by mouth daily.     No current facility-administered medications on file prior to visit.    BP 120/60   Pulse 62   Temp 97.9 F (36.6 C) (Oral)   Ht 5\' 8"  (1.727 m)   Wt 211 lb (95.7 kg)   SpO2 98%   BMI 32.08 kg/m       Objective:   Physical Exam Vitals and nursing note reviewed.  Constitutional:      Appearance: Normal appearance.  Cardiovascular:     Rate and Rhythm: Normal rate and regular rhythm.     Pulses: Normal pulses.     Heart sounds: Normal heart sounds.  Pulmonary:     Effort: Pulmonary effort is normal.     Breath sounds: Normal breath sounds.  Musculoskeletal:     Right lower leg: 2+ Pitting Edema present.     Left lower leg: 2+ Pitting Edema present.  Skin:    General: Skin is warm and dry.     Capillary Refill: Capillary refill takes less than 2 seconds.  Neurological:     General: No focal deficit present.     Mental Status: He is oriented to person, place, and time.  Psychiatric:        Mood and Affect: Mood normal.        Behavior: Behavior normal.        Thought Content: Thought content normal.        Judgment: Judgment normal.        Assessment & Plan:  1. OSA (obstructive sleep apnea) - Advised following up with Dr. Vassie Loll about recent sleep study findings; he will call today and get appointment  2. Chronic combined systolic and diastolic congestive heart failure (HCC)  - furosemide (LASIX) 40 MG tablet; Take 1 tablet (40 mg total) by mouth daily.  Dispense: 90 tablet; Refill: 3  3. Lower extremity edema  - furosemide (LASIX) 40 MG tablet; Take 1 tablet (  40 mg total) by mouth daily.  Dispense: 90 tablet; Refill: 3  4. Constipation, unspecified constipation type  - docusate sodium (COLACE) 100 MG capsule; Take 1 capsule (100 mg total) by mouth 2 (two) times daily.  Dispense: 180 capsule; Refill: 1  [name], NP  Time spent with patient today was 31 minutes which consisted of chart review, discussing sleep apnea, CHF, and constipation work up, treatment answering questions and documentation.

## 2023-02-15 NOTE — Patient Instructions (Addendum)
It was great seeing you today   Please call Dr. Vassie Loll ( Pulmonary doctor)  Phone: [phone] - This is for the sleep study you just had done   I have sent in lasix and Colace for you

## 2023-02-16 ENCOUNTER — Inpatient Hospital Stay: Payer: No Typology Code available for payment source | Admitting: Nutrition

## 2023-02-16 NOTE — Progress Notes (Signed)
82 yo male diagnosed with Pancreas cancer receiving Abraxane/Gemcitabine and followed by Dr. Truett Perna.  PMH includes HTN, CAD, Diverticulosis, GERD, Gout, IDA, Prostate cancer, Vit B 12 deficiency, Colon cancer 1989, "gastric surgery 40 years ago" per patient.  Medications include calcium/Vit D, Nexium, MVI, Miralax, Carafate.  Labs include Glucose 132, BUN 26, Albumin 3.4.  Height: 5'8". Weight: 211 pounds August 13. UBW: 250-260 pounds per patient. 221 pounds 6 oz June 26. BMI:32.08.  Patient reports poor appetite and constipation. Denies pain and N/V. He does not sleep well and has decreased energy. He is waiting to see what happens after staging CTs. He lives with his son but they don't eat together. He reports a donut or frosted flakes and coffee for breakfast, 1/2 sandwich mid-afternoon and ice cream for bedtime snack. Dinner is "hit or miss." He does not drink much water but likes unsweetened iced tea and consumes 3-4 glasses daily.  Diagnosed with moderate malnutrition during hospitalization in June.  Nutrition Diagnosis: Unintended wt loss related to cancer and associated treatments as evidenced by ~39 pound wt loss.  Intervention: Educated to consume small, frequent meals and snacks. Try to add high calorie, high protein foods 6 times daily. Try ONS and provided sample for patient to try. Also discussed options available in the stores he could try. Reviewed strategies for improving constipation and encouraged bowel regimen. Provided fact sheets and contact information. Questions answered.  Monitoring, Evaluation. Goals: Consume adequate calories and protein to minimize wt loss.  Follow up to be scheduled as needed.

## 2023-02-17 ENCOUNTER — Inpatient Hospital Stay: Payer: No Typology Code available for payment source

## 2023-02-17 ENCOUNTER — Encounter: Payer: Self-pay | Admitting: Nurse Practitioner

## 2023-02-17 ENCOUNTER — Inpatient Hospital Stay (HOSPITAL_BASED_OUTPATIENT_CLINIC_OR_DEPARTMENT_OTHER): Payer: No Typology Code available for payment source | Admitting: Nurse Practitioner

## 2023-02-17 ENCOUNTER — Encounter: Payer: Self-pay | Admitting: *Deleted

## 2023-02-17 VITALS — BP 150/72 | HR 60

## 2023-02-17 DIAGNOSIS — I2510 Atherosclerotic heart disease of native coronary artery without angina pectoris: Secondary | ICD-10-CM | POA: Diagnosis not present

## 2023-02-17 DIAGNOSIS — D649 Anemia, unspecified: Secondary | ICD-10-CM | POA: Diagnosis not present

## 2023-02-17 DIAGNOSIS — C259 Malignant neoplasm of pancreas, unspecified: Secondary | ICD-10-CM

## 2023-02-17 DIAGNOSIS — C250 Malignant neoplasm of head of pancreas: Secondary | ICD-10-CM | POA: Diagnosis not present

## 2023-02-17 DIAGNOSIS — I10 Essential (primary) hypertension: Secondary | ICD-10-CM | POA: Diagnosis not present

## 2023-02-17 DIAGNOSIS — Z85038 Personal history of other malignant neoplasm of large intestine: Secondary | ICD-10-CM | POA: Diagnosis not present

## 2023-02-17 DIAGNOSIS — I4891 Unspecified atrial fibrillation: Secondary | ICD-10-CM | POA: Diagnosis not present

## 2023-02-17 DIAGNOSIS — Z9884 Bariatric surgery status: Secondary | ICD-10-CM | POA: Diagnosis not present

## 2023-02-17 DIAGNOSIS — K219 Gastro-esophageal reflux disease without esophagitis: Secondary | ICD-10-CM | POA: Diagnosis not present

## 2023-02-17 DIAGNOSIS — M109 Gout, unspecified: Secondary | ICD-10-CM | POA: Diagnosis not present

## 2023-02-17 DIAGNOSIS — G4730 Sleep apnea, unspecified: Secondary | ICD-10-CM | POA: Diagnosis not present

## 2023-02-17 DIAGNOSIS — Z5111 Encounter for antineoplastic chemotherapy: Secondary | ICD-10-CM | POA: Diagnosis not present

## 2023-02-17 LAB — CBC WITH DIFFERENTIAL (CANCER CENTER ONLY)
Abs Immature Granulocytes: 0.17 10*3/uL — ABNORMAL HIGH (ref 0.00–0.07)
Basophils Absolute: 0 10*3/uL (ref 0.0–0.1)
Basophils Relative: 0 %
Eosinophils Absolute: 0.3 10*3/uL (ref 0.0–0.5)
Eosinophils Relative: 5 %
HCT: 26.6 % — ABNORMAL LOW (ref 39.0–52.0)
Hemoglobin: 8.7 g/dL — ABNORMAL LOW (ref 13.0–17.0)
Immature Granulocytes: 3 %
Lymphocytes Relative: 21 %
Lymphs Abs: 1.3 10*3/uL (ref 0.7–4.0)
MCH: 29.5 pg (ref 26.0–34.0)
MCHC: 32.7 g/dL (ref 30.0–36.0)
MCV: 90.2 fL (ref 80.0–100.0)
Monocytes Absolute: 0.8 10*3/uL (ref 0.1–1.0)
Monocytes Relative: 13 %
Neutro Abs: 3.6 10*3/uL (ref 1.7–7.7)
Neutrophils Relative %: 58 %
Platelet Count: 247 10*3/uL (ref 150–400)
RBC: 2.95 MIL/uL — ABNORMAL LOW (ref 4.22–5.81)
RDW: 15 % (ref 11.5–15.5)
WBC Count: 6.2 10*3/uL (ref 4.0–10.5)
nRBC: 0 % (ref 0.0–0.2)

## 2023-02-17 LAB — CMP (CANCER CENTER ONLY)
ALT: 16 U/L (ref 0–44)
AST: 14 U/L — ABNORMAL LOW (ref 15–41)
Albumin: 3.3 g/dL — ABNORMAL LOW (ref 3.5–5.0)
Alkaline Phosphatase: 146 U/L — ABNORMAL HIGH (ref 38–126)
Anion gap: 7 (ref 5–15)
BUN: 23 mg/dL (ref 8–23)
CO2: 25 mmol/L (ref 22–32)
Calcium: 8.6 mg/dL — ABNORMAL LOW (ref 8.9–10.3)
Chloride: 107 mmol/L (ref 98–111)
Creatinine: 0.96 mg/dL (ref 0.61–1.24)
GFR, Estimated: >60 mL/min (ref >60)
Glucose, Bld: 149 mg/dL — ABNORMAL HIGH (ref 70–99)
Potassium: 3.6 mmol/L (ref 3.5–5.1)
Sodium: 139 mmol/L (ref 135–145)
Total Bilirubin: 0.4 mg/dL (ref 0.3–1.2)
Total Protein: 6.4 g/dL — ABNORMAL LOW (ref 6.5–8.1)

## 2023-02-17 MED ORDER — SODIUM CHLORIDE 0.9% FLUSH
10.0000 mL | INTRAVENOUS | Status: DC | PRN
  Administered 2023-02-17: 10 mL

## 2023-02-17 MED ORDER — PROCHLORPERAZINE MALEATE 10 MG PO TABS
10.0000 mg | ORAL_TABLET | Freq: Once | ORAL | Status: AC
  Administered 2023-02-17: 10 mg via ORAL
  Filled 2023-02-17: qty 1

## 2023-02-17 MED ORDER — SODIUM CHLORIDE 0.9 % IV SOLN: Freq: Once | INTRAVENOUS | Status: AC

## 2023-02-17 MED ORDER — SODIUM CHLORIDE 0.9 % IV SOLN
800.0000 mg/m2 | Freq: Once | INTRAVENOUS | Status: AC
  Administered 2023-02-17: 1710 mg via INTRAVENOUS
  Filled 2023-02-17: qty 25.98

## 2023-02-17 MED ORDER — PACLITAXEL PROTEIN-BOUND CHEMO INJECTION 100 MG
100.0000 mg/m2 | Freq: Once | INTRAVENOUS | Status: AC
  Administered 2023-02-17: 200 mg via INTRAVENOUS
  Filled 2023-02-17: qty 40

## 2023-02-17 MED ORDER — HEPARIN SOD (PORK) LOCK FLUSH 100 UNIT/ML IV SOLN
500.0000 [IU] | Freq: Once | INTRAVENOUS | Status: AC | PRN
  Administered 2023-02-17: 500 [IU]

## 2023-02-17 NOTE — Patient Instructions (Signed)
[facility]   Discharge Instructions: Thank you for choosing [facility] to provide your oncology and hematology care.   If you have a lab appointment with the Cancer Center, please go directly to the Cancer Center and check in at the registration area.   Wear comfortable clothing and clothing appropriate for easy access to any Portacath or PICC line.   We strive to give you quality time with your provider. You may need to reschedule your appointment if you arrive late (15 or more minutes).  Arriving late affects you and other patients whose appointments are after yours.  Also, if you miss three or more appointments without notifying the office, you may be dismissed from the clinic at the provider's discretion.      For prescription refill requests, have your pharmacy contact our office and allow 72 hours for refills to be completed.    Today you received the following chemotherapy and/or immunotherapy agents Paclitaxel-protein bound (ABRAXANE) & Gemcitabine (GEMZAR).   To help prevent nausea and vomiting after your treatment, we encourage you to take your nausea medication as directed.  BELOW ARE SYMPTOMS THAT SHOULD BE REPORTED IMMEDIATELY: *FEVER GREATER THAN 100.4 F (38 C) OR HIGHER *CHILLS OR SWEATING *NAUSEA AND VOMITING THAT IS NOT CONTROLLED WITH YOUR NAUSEA MEDICATION *UNUSUAL SHORTNESS OF BREATH *UNUSUAL BRUISING OR BLEEDING *URINARY PROBLEMS (pain or burning when urinating, or frequent urination) *BOWEL PROBLEMS (unusual diarrhea, constipation, pain near the anus) TENDERNESS IN MOUTH AND THROAT WITH OR WITHOUT PRESENCE OF ULCERS (sore throat, sores in mouth, or a toothache) UNUSUAL RASH, SWELLING OR PAIN  UNUSUAL VAGINAL DISCHARGE OR ITCHING   Items with * indicate a potential emergency and should be followed up as soon as possible or go to the Emergency Department if any problems should occur.  Please show the CHEMOTHERAPY  ALERT CARD or IMMUNOTHERAPY ALERT CARD at check-in to the Emergency Department and triage nurse.  Should you have questions after your visit or need to cancel or reschedule your appointment, please contact [facility]  Dept: [phone]  and follow the prompts.  Office hours are 8:00 a.m. to 4:30 p.m. Monday - Friday. Please note that voicemails left after 4:00 p.m. may not be returned until the following business day.  We are closed weekends and major holidays. You have access to a nurse at all times for urgent questions. Please call the main number to the clinic Dept: [phone] and follow the prompts.   For any non-urgent questions, you may also contact your provider using MyChart. We now offer e-Visits for anyone 18 and older to request care online for non-urgent symptoms. For details visit [email or web address].   Also download the MyChart app! Go to the app store, search "MyChart", open the app, select [facility], and log in with your MyChart username and password.  Paclitaxel Nanoparticle Albumin-Bound Injection What is this medication? NANOPARTICLE ALBUMIN-BOUND PACLITAXEL (Na no PAHR ti kuhl al BYOO muhn-bound PAK li TAX el) treats some types of cancer. It works by slowing down the growth of cancer cells. This medicine may be used for other purposes; ask your health care provider or pharmacist if you have questions. COMMON BRAND NAME(S): Abraxane What should I tell my care team before I take this medication? They need to know if you have any of these conditions: Liver disease Low white blood cell levels An unusual or allergic reaction to paclitaxel, albumin, other medications, foods, dyes, or preservatives   If you or your partner are pregnant or trying to get pregnant Breast-feeding How should I use this medication? This medication is injected into a vein. It is given by your care team in a hospital or clinic setting. Talk to your care team  about the use of this medication in children. Special care may be needed. Overdosage: If you think you have taken too much of this medicine contact a poison control center or emergency room at once. NOTE: This medicine is only for you. Do not share this medicine with others. What if I miss a dose? Keep appointments for follow-up doses. It is important not to miss your dose. Call your care team if you are unable to keep an appointment. What may interact with this medication? Other medications may affect the way this medication works. Talk with your care team about all of the medications you take. They may suggest changes to your treatment plan to lower the risk of side effects and to make sure your medications work as intended. This list may not describe all possible interactions. Give your health care provider a list of all the medicines, herbs, non-prescription drugs, or dietary supplements you use. Also tell them if you smoke, drink alcohol, or use illegal drugs. Some items may interact with your medicine. What should I watch for while using this medication? Your condition will be monitored carefully while you are receiving this medication. You may need blood work while taking this medication. This medication may make you feel generally unwell. This is not uncommon as chemotherapy can affect healthy cells as well as cancer cells. Report any side effects. Continue your course of treatment even though you feel ill unless your care team tells you to stop. This medication can cause serious allergic reactions. To reduce the risk, your care team may give you other medications to take before receiving this one. Be sure to follow the directions from your care team. This medication may increase your risk of getting an infection. Call your care team for advice if you get a fever, chills, sore throat, or other symptoms of a cold or flu. Do not treat yourself. Try to avoid being around people who are sick. This  medication may increase your risk to bruise or bleed. Call your care team if you notice any unusual bleeding. Be careful brushing or flossing your teeth or using a toothpick because you may get an infection or bleed more easily. If you have any dental work done, tell your dentist you are receiving this medication. Talk to your care team if you or your partner may be pregnant. Serious birth defects can occur if you take this medication during pregnancy and for 6 months after the last dose. You will need a negative pregnancy test before starting this medication. Contraception is recommended while taking this medication and for 6 months after the last dose. Your care team can help you find the option that works for you. If your partner can get pregnant, use a condom during sex while taking this medication and for 3 months after the last dose. Do not breastfeed while taking this medication and for 2 weeks after the last dose. This medication may cause infertility. Talk to your care team if you are concerned about your fertility. What side effects may I notice from receiving this medication? Side effects that you should report to your care team as soon as possible: Allergic reactions--skin rash, itching, hives, swelling of the face, lips, tongue, or throat Dry cough,   shortness of breath or trouble breathing Infection--fever, chills, cough, sore throat, wounds that don't heal, pain or trouble when passing urine, general feeling of discomfort or being unwell Low red blood cell level--unusual weakness or fatigue, dizziness, headache, trouble breathing Pain, tingling, or numbness in the hands or feet Stomach pain, unusual weakness or fatigue, nausea, vomiting, diarrhea, or fever that lasts longer than expected Unusual bruising or bleeding Side effects that usually do not require medical attention (report to your care team if they continue or are bothersome): Diarrhea Fatigue Hair loss Loss of  appetite Nausea Vomiting This list may not describe all possible side effects. Call your doctor for medical advice about side effects. You may report side effects to FDA at 1-800-FDA-1088. Where should I keep my medication? This medication is given in a hospital or clinic. It will not be stored at home. NOTE: This sheet is a summary. It may not cover all possible information. If you have questions about this medicine, talk to your doctor, pharmacist, or health care provider.  2024 Elsevier/Gold Standard (2021-11-05 00:00:00)  Gemcitabine Injection What is this medication? GEMCITABINE (jem SYE ta been) treats some types of cancer. It works by slowing down the growth of cancer cells. This medicine may be used for other purposes; ask your health care provider or pharmacist if you have questions. COMMON BRAND NAME(S): Gemzar, Infugem What should I tell my care team before I take this medication? They need to know if you have any of these conditions: Blood disorders Infection Kidney disease Liver disease Lung or breathing disease, such as asthma or COPD Recent or ongoing radiation therapy An unusual or allergic reaction to gemcitabine, other medications, foods, dyes, or preservatives If you or your partner are pregnant or trying to get pregnant Breast-feeding How should I use this medication? This medication is injected into a vein. It is given by your care team in a hospital or clinic setting. Talk to your care team about the use of this medication in children. Special care may be needed. Overdosage: If you think you have taken too much of this medicine contact a poison control center or emergency room at once. NOTE: This medicine is only for you. Do not share this medicine with others. What if I miss a dose? Keep appointments for follow-up doses. It is important not to miss your dose. Call your care team if you are unable to keep an appointment. What may interact with this  medication? Interactions have not been studied. This list may not describe all possible interactions. Give your health care provider a list of all the medicines, herbs, non-prescription drugs, or dietary supplements you use. Also tell them if you smoke, drink alcohol, or use illegal drugs. Some items may interact with your medicine. What should I watch for while using this medication? Your condition will be monitored carefully while you are receiving this medication. This medication may make you feel generally unwell. This is not uncommon, as chemotherapy can affect healthy cells as well as cancer cells. Report any side effects. Continue your course of treatment even though you feel ill unless your care team tells you to stop. In some cases, you may be given additional medications to help with side effects. Follow all directions for their use. This medication may increase your risk of getting an infection. Call your care team for advice if you get a fever, chills, sore throat, or other symptoms of a cold or flu. Do not treat yourself. Try to avoid being around   people who are sick. This medication may increase your risk to bruise or bleed. Call your care team if you notice any unusual bleeding. Be careful brushing or flossing your teeth or using a toothpick because you may get an infection or bleed more easily. If you have any dental work done, tell your dentist you are receiving this medication. Avoid taking medications that contain aspirin, acetaminophen, ibuprofen, naproxen, or ketoprofen unless instructed by your care team. These medications may hide a fever. Talk to your care team if you or your partner wish to become pregnant or think you might be pregnant. This medication can cause serious birth defects if taken during pregnancy and for 6 months after the last dose. A negative pregnancy test is required before starting this medication. A reliable form of contraception is recommended while taking  this medication and for 6 months after the last dose. Talk to your care team about effective forms of contraception. Do not father a child while taking this medication and for 3 months after the last dose. Use a condom while having sex during this time period. Do not breastfeed while taking this medication and for at least 1 week after the last dose. This medication may cause infertility. Talk to your care team if you are concerned about your fertility. What side effects may I notice from receiving this medication? Side effects that you should report to your care team as soon as possible: Allergic reactions--skin rash, itching, hives, swelling of the face, lips, tongue, or throat Capillary leak syndrome--stomach or muscle pain, unusual weakness or fatigue, feeling faint or lightheaded, decrease in the amount of urine, swelling of the ankles, hands, or feet, trouble breathing Infection--fever, chills, cough, sore throat, wounds that don't heal, pain or trouble when passing urine, general feeling of discomfort or being unwell Liver injury--right upper belly pain, loss of appetite, nausea, light-colored stool, dark yellow or brown urine, yellowing skin or eyes, unusual weakness or fatigue Low red blood cell level--unusual weakness or fatigue, dizziness, headache, trouble breathing Lung injury--shortness of breath or trouble breathing, cough, spitting up blood, chest pain, fever Stomach pain, bloody diarrhea, pale skin, unusual weakness or fatigue, decrease in the amount of urine, which may be signs of hemolytic uremic syndrome Sudden and severe headache, confusion, change in vision, seizures, which may be signs of posterior reversible encephalopathy syndrome (PRES) Unusual bruising or bleeding Side effects that usually do not require medical attention (report to your care team if they continue or are bothersome): Diarrhea Drowsiness Hair loss Nausea Pain, redness, or swelling with sores inside the  mouth or throat Vomiting This list may not describe all possible side effects. Call your doctor for medical advice about side effects. You may report side effects to FDA at 1-800-FDA-1088. Where should I keep my medication? This medication is given in a hospital or clinic. It will not be stored at home. NOTE: This sheet is a summary. It may not cover all possible information. If you have questions about this medicine, talk to your doctor, pharmacist, or health care provider.  2024 Elsevier/Gold Standard (2021-10-27 00:00:00)  

## 2023-02-17 NOTE — Progress Notes (Signed)
PATIENT NAVIGATOR PROGRESS NOTE  Name: Marcus [name] Date: 02/17/2023 MRN: [id number]  DOB: 1940-09-11   Reason for visit:  Office visit and 2nd opinion  Comments:  Referral for second opinion faxed to [facility] GI oncology at [phone] for Dr Stevphen Meuse or Dr Meryl Dare    Time spent counseling/coordinating care: 30-45 minutes

## 2023-02-17 NOTE — Patient Instructions (Signed)

## 2023-02-17 NOTE — Progress Notes (Signed)
Marcus Marcus [name] OFFICE PROGRESS NOTE   Diagnosis: Pancreas cancer  INTERVAL HISTORY:   Marcus Marcus [name] returns as scheduled.  He completed cycle 3 gemcitabine/Abraxane 02/04/2023.  Denies nausea/vomiting.  No mouth sores.  No diarrhea.  He has stable tingling in his toes that predated chemotherapy.  1 or 2 days after treatment he developed a red "well-demarcated" rash at the lower legs per daughter.  No fever.  The rash improved without intervention.  He continues to feel weak.  He denies abdominal pain.  He denies bleeding.  Objective:  Vital signs in last 24 hours:  Blood pressure (!) 140/60, pulse 64, temperature 98.1 F (36.7 C), temperature source Oral, resp. rate 18, height 5\' 8"  (1.727 m), weight 214 lb (97.1 kg), SpO2 100%.    HEENT: No thrush or ulcers. Resp: Lungs clear bilaterally. Cardio: Regular rate and rhythm. GI: Abdomen soft and nontender.  No hepatosplenomegaly. Vascular: Trace edema lower leg bilaterally. Skin: Very faint erythema at the lower leg bilaterally. Port-A-Cath without erythema.  Lab Results:  Lab Results  Component Value Date   WBC 6.2 02/17/2023   HGB 8.7 (L) 02/17/2023   HCT 26.6 (L) 02/17/2023   MCV 90.2 02/17/2023   PLT 247 02/17/2023   NEUTROABS 3.6 02/17/2023    Imaging:  No results found.  Medications: I have reviewed the patient's current medications.  Assessment/Plan: Pancreas cancer CT abdomen/pelvis 12/03/2022-suspected pancreas mass in the proximal body measuring 2 cm with probable mass effect on the superior aspect of the SMV, multiple hypodense liver lesions, many are new from a remote CT-indeterminate, 2 cystic lesions in the pancreas tail-nonspecific prior bariatric surgery ERCP 12/13/2022-malignant appearing common bile duct stricture, plastic stent placed, brushing cytology-no malignant cells EUS 12/23/2022-30 x 33 mm pancreas head mass with invasion of the SMV, no malignant appearing lymph nodes, 4 cysts in the  visualized liver, FNA-adenocarcinoma Cycle 1 gemcitabine and abraxane 01/07/2023 CTs 01/09/2023-multiple low-attenuation liver lesions unchanged, largest of which are clearly benign fluid attenuation cyst, others more ill-defined and incompletely characterized suspicious for small metastases.  Similar appearance of hypodense mass in the pancreatic head.  Distended gallbladder with mild gallbladder wall thickening and adjacent pericholecystic fat stranding.  Sludge in the gallbladder fundus.  Common bile duct stent in place with diminished, persistent intrahepatic biliary ductal dilatation.  Negative for pulmonary embolism.  Acute appearing heterogeneous airspace opacity of the superior segment left lower lobe consistent with infection or aspiration. Cycle 2 gemcitabine and abraxane 01/21/2023, gemcitabine dose reduced due to neutropenia following cycle 1 Cycle 3 gemcitabine/Abraxane 02/04/2023 Cycle 4 gemcitabine/Abraxane 02/17/2023 Biliary obstruction secondary to 1-status post ERCP/stent placement 12/13/2022 Coronary artery disease Atrial fibrillation Gout Sleep apnea Status post bariatric surgery Hypertension Bronchiectasis Gastroesophageal reflux disease History of colon cancer in his 40s Complete heart block-pacemaker placed Admission 01/09/2023 with a fever and failure to thrive, CT chest with evidence of left lung pneumonia, CT abdomen/pelvis with a distended gallbladder and adjacent fat stranding Anemia  Disposition: Marcus Marcus [name] appears stable.  He has completed 3 cycles of gemcitabine/Abraxane.  Plan to proceed with cycle 4 today as scheduled.  We will follow-up on the CA 19-9 from today.    We discussed the rash at the lower legs 1 to 2 days after the most recent treatment.  The rash could be "pseudo cellulitis" related to Gemcitabine.  He will contact the office if the rash recurs.  CBC and chemistry panel reviewed.  Labs adequate for treatment.  He has persistent stable anemia.  We  discussed  a blood transfusion to see if this would help with his complaint of weakness.  He declines a blood transfusion at present.  He is interested in a second opinion to discuss other treatment options.  Referral made to [name].  He will return for follow-up and treatment in 2 weeks.  We are available to see him sooner if needed.    Marcus Marcus [name] ANP/GNP-BC   02/17/2023  9:28 AM

## 2023-02-17 NOTE — Progress Notes (Signed)
Patient seen by Lisa Thomas NP today  Vitals are within treatment parameters.  Labs reviewed by Lisa Thomas NP and are within treatment parameters.  Per physician team, patient is ready for treatment and there are NO modifications to the treatment plan.     

## 2023-02-18 LAB — CANCER ANTIGEN 19-9: CA 19-9: 840 U/mL — ABNORMAL HIGH (ref 0–35)

## 2023-02-22 ENCOUNTER — Encounter: Payer: Self-pay | Admitting: *Deleted

## 2023-02-22 ENCOUNTER — Telehealth: Payer: Self-pay | Admitting: *Deleted

## 2023-02-23 ENCOUNTER — Other Ambulatory Visit: Payer: Self-pay

## 2023-02-23 ENCOUNTER — Encounter: Payer: Self-pay | Admitting: Oncology

## 2023-02-23 ENCOUNTER — Other Ambulatory Visit: Payer: Self-pay | Admitting: Adult Health

## 2023-02-23 ENCOUNTER — Encounter (HOSPITAL_BASED_OUTPATIENT_CLINIC_OR_DEPARTMENT_OTHER): Payer: Self-pay | Admitting: *Deleted

## 2023-02-23 ENCOUNTER — Other Ambulatory Visit (HOSPITAL_BASED_OUTPATIENT_CLINIC_OR_DEPARTMENT_OTHER): Payer: Self-pay

## 2023-02-23 ENCOUNTER — Emergency Department (HOSPITAL_BASED_OUTPATIENT_CLINIC_OR_DEPARTMENT_OTHER): Payer: No Typology Code available for payment source

## 2023-02-23 ENCOUNTER — Emergency Department (HOSPITAL_BASED_OUTPATIENT_CLINIC_OR_DEPARTMENT_OTHER)
Admission: EM | Admit: 2023-02-23 | Discharge: 2023-02-23 | Disposition: A | Discharge status: Home / Self Care | Payer: No Typology Code available for payment source | Attending: Emergency Medicine | Admitting: Emergency Medicine

## 2023-02-23 DIAGNOSIS — R600 Localized edema: Secondary | ICD-10-CM | POA: Diagnosis present

## 2023-02-23 DIAGNOSIS — Z7901 Long term (current) use of anticoagulants: Secondary | ICD-10-CM | POA: Diagnosis not present

## 2023-02-23 DIAGNOSIS — Z8507 Personal history of malignant neoplasm of pancreas: Secondary | ICD-10-CM | POA: Insufficient documentation

## 2023-02-23 DIAGNOSIS — F5101 Primary insomnia: Secondary | ICD-10-CM

## 2023-02-23 LAB — COMPREHENSIVE METABOLIC PANEL
ALT: 17 U/L (ref 0–44)
AST: 16 U/L (ref 15–41)
Albumin: 3.3 g/dL — ABNORMAL LOW (ref 3.5–5.0)
Alkaline Phosphatase: 111 U/L (ref 38–126)
Anion gap: 6 (ref 5–15)
BUN: 19 mg/dL (ref 8–23)
CO2: 24 mmol/L (ref 22–32)
Calcium: 8.8 mg/dL — ABNORMAL LOW (ref 8.9–10.3)
Chloride: 109 mmol/L (ref 98–111)
Creatinine, Ser: 0.77 mg/dL (ref 0.61–1.24)
GFR, Estimated: >60 mL/min (ref >60)
Glucose, Bld: 108 mg/dL — ABNORMAL HIGH (ref 70–99)
Potassium: 3.8 mmol/L (ref 3.5–5.1)
Sodium: 139 mmol/L (ref 135–145)
Total Bilirubin: 0.4 mg/dL (ref 0.3–1.2)
Total Protein: 6 g/dL — ABNORMAL LOW (ref 6.5–8.1)

## 2023-02-23 LAB — CBC WITH DIFFERENTIAL/PLATELET
Abs Immature Granulocytes: 0.04 10*3/uL (ref 0.00–0.07)
Basophils Absolute: 0 10*3/uL (ref 0.0–0.1)
Basophils Relative: 1 %
Eosinophils Absolute: 0.2 10*3/uL (ref 0.0–0.5)
Eosinophils Relative: 5 %
HCT: 25.7 % — ABNORMAL LOW (ref 39.0–52.0)
Hemoglobin: 8.3 g/dL — ABNORMAL LOW (ref 13.0–17.0)
Immature Granulocytes: 1 %
Lymphocytes Relative: 28 %
Lymphs Abs: 0.9 10*3/uL (ref 0.7–4.0)
MCH: 28.9 pg (ref 26.0–34.0)
MCHC: 32.3 g/dL (ref 30.0–36.0)
MCV: 89.5 fL (ref 80.0–100.0)
Monocytes Absolute: 0.2 10*3/uL (ref 0.1–1.0)
Monocytes Relative: 6 %
Neutro Abs: 2 10*3/uL (ref 1.7–7.7)
Neutrophils Relative %: 59 %
Platelets: 243 10*3/uL (ref 150–400)
RBC: 2.87 MIL/uL — ABNORMAL LOW (ref 4.22–5.81)
RDW: 14.8 % (ref 11.5–15.5)
WBC: 3.4 10*3/uL — ABNORMAL LOW (ref 4.0–10.5)
nRBC: 0 % (ref 0.0–0.2)

## 2023-02-23 LAB — LIPASE, BLOOD: Lipase: 22 U/L (ref 11–51)

## 2023-02-23 MED ORDER — ONDANSETRON HCL 8 MG PO TABS: 8.0000 mg | ORAL_TABLET | Freq: Three times a day (TID) | ORAL | 0 refills | Status: DC | PRN

## 2023-02-23 MED ORDER — GEMTESA 75 MG PO TABS: 75.0000 mg | ORAL_TABLET | Freq: Every day | ORAL | 3 refills | Status: DC

## 2023-02-23 MED ORDER — WARFARIN SODIUM 5 MG PO TABS: 5.0000 mg | ORAL_TABLET | Freq: Every day | ORAL | 3 refills | Status: DC

## 2023-02-23 MED ORDER — APIXABAN 5 MG PO TABS: 5.0000 mg | ORAL_TABLET | Freq: Two times a day (BID) | ORAL | 1 refills | Status: DC

## 2023-02-23 MED ORDER — TADALAFIL 20 MG PO TABS: 10.0000 mg | ORAL_TABLET | Freq: Every day | ORAL | 11 refills | Status: DC | PRN

## 2023-02-23 MED ORDER — IOHEXOL 350 MG/ML SOLN
100.0000 mL | Freq: Once | INTRAVENOUS | Status: AC | PRN
  Administered 2023-02-23: 100 mL via INTRAVENOUS

## 2023-02-23 MED ORDER — APIXABAN 5 MG PO TABS
5.0000 mg | ORAL_TABLET | Freq: Two times a day (BID) | ORAL | 1 refills | Status: AC
  Filled 2023-02-23: qty 180, 90d supply, fill #0
  Filled 2023-02-24: qty 60, 30d supply, fill #0
  Filled 2023-03-30: qty 60, 30d supply, fill #1
  Filled 2023-05-07: qty 60, 30d supply, fill #2
  Filled 2023-06-16: qty 60, 30d supply, fill #3
  Filled 2023-08-08: qty 60, 30d supply, fill #4

## 2023-02-23 MED ORDER — ESZOPICLONE 3 MG PO TABS
3.0000 mg | ORAL_TABLET | Freq: Every day | ORAL | 2 refills | Status: DC
Start: 2023-02-23 — End: 2023-06-13
  Filled 2023-02-23: qty 30, 30d supply, fill #0
  Filled 2023-04-05: qty 30, 30d supply, fill #1
  Filled 2023-05-07: qty 30, 30d supply, fill #2

## 2023-02-23 MED ORDER — ONDANSETRON HCL 4 MG/2ML IJ SOLN
4.0000 mg | Freq: Once | INTRAMUSCULAR | Status: AC
  Administered 2023-02-23: 4 mg via INTRAVENOUS
  Filled 2023-02-23: qty 2

## 2023-02-23 MED ORDER — HEPARIN SOD (PORK) LOCK FLUSH 100 UNIT/ML IV SOLN
500.0000 [IU] | Freq: Once | INTRAVENOUS | Status: AC
  Administered 2023-02-23: 500 [IU]
  Filled 2023-02-23: qty 5

## 2023-02-23 MED ORDER — IPRATROPIUM BROMIDE 0.03 % NA SOLN: 2.0000 | Freq: Two times a day (BID) | NASAL | 1 refills | Status: DC

## 2023-02-23 MED ORDER — VALSARTAN-HYDROCHLOROTHIAZIDE 320-25 MG PO TABS: 1.0000 | ORAL_TABLET | Freq: Every day | ORAL | 3 refills | Status: DC

## 2023-02-23 MED ORDER — PROCHLORPERAZINE MALEATE 10 MG PO TABS: 10.0000 mg | ORAL_TABLET | Freq: Four times a day (QID) | ORAL | 1 refills | Status: DC | PRN

## 2023-02-23 MED ORDER — ESOMEPRAZOLE MAGNESIUM 40 MG PO CPDR: 40.0000 mg | DELAYED_RELEASE_CAPSULE | Freq: Two times a day (BID) | ORAL | 12 refills | Status: DC

## 2023-02-23 NOTE — Discharge Instructions (Signed)
Increase your furosemide (Lasix) to twice a day for the next three days to get extra fluid off your legs, then return to once a day unless otherwise directed by your doctor. Use compression stockings to help with swelling and keep your feet elevated as much as possible.

## 2023-02-23 NOTE — ED Triage Notes (Signed)
Pt arrived with BLE; reports scratching his right leg tonight and noticed fluid leak from area and became concerned. Denies pain, having numbness in lower legs. +4 pitting edema noted

## 2023-02-23 NOTE — ED Provider Notes (Signed)
[place] [facility] AT [facility]  Provider Note  CSN: [id number] Arrival date & time: 02/23/23 0206  History Chief Complaint  Patient presents with   Leg Swelling    Marcus [name] is a 82 y.o. male with recent diagnosis of pancreatic cancer getting chemo reports he has had 1 days of significant increase in BLE swelling. His cancer is known to be involving his SMV. He reports some nausea but no abdominal pain or fever. Tonight his legs were itching and ne noticed clear fluid draining after he scratched them.    Home Medications Prior to Admission medications   Medication Sig Start Date End Date Taking? Authorizing Provider  acetaminophen (TYLENOL) 325 MG tablet Take 2 tablets (650 mg total) by mouth every 6 (six) hours as needed for mild pain (or Fever >/= 101). 01/13/23   Sheikh, Omair Latif, DO  allopurinol (ZYLOPRIM) 300 MG tablet Take 1 tablet (300 mg total) by mouth daily. 05/25/22   Nafziger, Kandee Keen, NP  amLODipine (NORVASC) 10 MG tablet Take 1 tablet (10 mg total) by mouth daily. 05/25/22   Nafziger, Kandee Keen, NP  apixaban (ELIQUIS) 5 MG TABS tablet Take 1 tablet (5 mg total) by mouth 2 (two) times daily. 02/08/23   Mansouraty, Netty Starring., MD  Calcium Carb-Cholecalciferol (CALCIUM 600 + D PO) Take 1 tablet by mouth daily. 800 units of vitamin D    [provider]  docusate sodium (COLACE) 100 MG capsule Take 1 capsule (100 mg total) by mouth 2 (two) times daily. 02/15/23 05/16/23  Nafziger, Kandee Keen, NP  Eszopiclone 3 MG TABS Take 1 tablet (3 mg total) by mouth at bedtime. Take immediately before bedtime Patient not taking: Reported on 02/17/2023 01/25/23   [name], NP  furosemide (LASIX) 40 MG tablet Take 1 tablet (40 mg total) by mouth daily. 02/15/23   Nafziger, Kandee Keen, NP  guaiFENesin (MUCINEX) 600 MG 12 hr tablet Take 600 mg by mouth 2 (two) times daily as needed for cough.    [provider]  hydrOXYzine (ATARAX) 50 MG tablet Take 1 tablet (50  mg total) by mouth 3 (three) times daily as needed. 12/23/22 03/23/23  Nafziger, Kandee Keen, NP  lidocaine-prilocaine (EMLA) cream Apply 1 Application topically as needed. 12/31/22   [name], MD  Multiple Vitamin (MULTIVITAMIN) tablet Take 1 tablet by mouth in the morning.    [provider]  polyethylene glycol powder (GLYCOLAX/MIRALAX) 17 GM/SCOOP powder Take 17g (1 capful) by mouth as directed daily as needed for mild constipation. 12/15/22   Rolly Salter, MD  potassium chloride SA (KLOR-CON M) 20 MEQ tablet Take 1 tablet (20 mEq total) by mouth daily. 01/13/23   Merlene Laughter, DO  Respiratory Therapy Supplies (FLUTTER) DEVI 1 each by Does not apply route in the morning and at bedtime. 10/11/19   Karie Fetch P, DO  sodium chloride HYPERTONIC 3 % nebulizer solution Take by nebulization as needed for other. Patient taking differently: Take 4 mLs by nebulization as needed for other. 04/01/22   [name], NP  sucralfate (CARAFATE) 1 GM/10ML suspension Take 10 mLs (1 g total) by mouth 2 (two) times daily. 12/23/22 12/23/23  Mansouraty, Netty Starring., MD  tamsulosin (FLOMAX) 0.4 MG CAPS capsule Take 0.4 mg by mouth daily. 03/04/16   [provider]     Allergies    Patient has no active allergies.   Review of Systems   Review of Systems Please see HPI for pertinent positives and negatives  Physical Exam BP (!) 155/78   Pulse 60   Temp 97.7 F (36.5 C)   Resp 20   SpO2 100%   Physical Exam Vitals and nursing note reviewed.  Constitutional:      Appearance: Normal appearance.  HENT:     Head: Normocephalic and atraumatic.     Nose: Nose normal.     Mouth/Throat:     Mouth: Mucous membranes are moist.  Eyes:     Extraocular Movements: Extraocular movements intact.     Conjunctiva/sclera: Conjunctivae normal.  Cardiovascular:     Rate and Rhythm: Normal rate.  Pulmonary:     Effort: Pulmonary effort is normal.     Breath sounds: Normal breath sounds.   Abdominal:     General: Abdomen is flat.     Palpations: Abdomen is soft.     Tenderness: There is no abdominal tenderness. There is no guarding.  Musculoskeletal:        General: Normal range of motion.     Cervical back: Neck supple.     Right lower leg: Edema (3+ to knees) present.     Left lower leg: Edema (3+ to knees) present.  Skin:    General: Skin is warm and dry.  Neurological:     General: No focal deficit present.     Mental Status: He is alert.  Psychiatric:        Mood and Affect: Mood normal.     ED Results / Procedures / Treatments   EKG None  Procedures Procedures  Medications Ordered in the ED Medications  ondansetron (ZOFRAN) injection 4 mg (4 mg Intravenous Given 02/23/23 0252)  iohexol (OMNIPAQUE) 350 MG/ML injection 100 mL (100 mLs Intravenous Contrast Given 02/23/23 0337)    Initial Impression and Plan  Patient here with known pancreatic cancer now with LE edema. Suspect a proximal venous occlusion given bilateral nature. Will check labs and send for CT venogram to evaluate.   ED Course   Clinical Course as of 02/23/23 0450  Wed Feb 23, 2023  0300 CBC is about at baseline.  [CS]  0324 CMP and lipase are at baseline.  [CS]  0420 I personally viewed the images from radiology studies and agree with radiologist interpretation:  CT neg for proximal venous occlusion or thrombus. Gall bladder findings are not associated with leukocytosis, elevated LFTs, abdominal pain or RUQ tenderness. No concern clinically for acute cholecystitis.  [CS]  [id number] Patient resting comfortably. No definite cause of his swelling found, but no evidence of thrombus or other emergent cause. Patient is comfortable going home, will Rx Lasix x 3 days and recommend he obtain compression stockings and keep his feet elevated as much as possible. He already has follow up scheduled at the [facility]. Recommend he return here sooner if he develops, pain, fever, redness or other concerning  symptoms.  [CS]    Clinical Course User Index [CS] Pollyann Savoy, MD     MDM Rules/Calculators/A&P Medical Decision Making Given presenting complaint, I considered that admission might be necessary. After review of results from ED lab and/or imaging studies, admission to the hospital is not indicated at this time.    Problems Addressed: Peripheral edema: acute illness or injury  Amount and/or Complexity of Data Reviewed Labs: ordered. Decision-making details documented in ED Course. Radiology: ordered and independent interpretation performed. Decision-making details documented in ED Course.  Risk Prescription drug management. Decision regarding hospitalization.     Final Clinical Impression(s) / ED Diagnoses Final  diagnoses:  Peripheral edema    Rx / DC Orders ED Discharge Orders     None        Pollyann Savoy, MD 02/23/23 [phone]

## 2023-02-24 ENCOUNTER — Other Ambulatory Visit (HOSPITAL_BASED_OUTPATIENT_CLINIC_OR_DEPARTMENT_OTHER): Payer: Self-pay

## 2023-02-27 ENCOUNTER — Other Ambulatory Visit: Payer: Self-pay | Admitting: Oncology

## 2023-02-27 ENCOUNTER — Encounter (HOSPITAL_BASED_OUTPATIENT_CLINIC_OR_DEPARTMENT_OTHER): Payer: Self-pay

## 2023-02-27 ENCOUNTER — Other Ambulatory Visit: Payer: Self-pay

## 2023-02-27 ENCOUNTER — Emergency Department (HOSPITAL_BASED_OUTPATIENT_CLINIC_OR_DEPARTMENT_OTHER): Payer: No Typology Code available for payment source

## 2023-02-27 ENCOUNTER — Emergency Department (HOSPITAL_BASED_OUTPATIENT_CLINIC_OR_DEPARTMENT_OTHER)
Admission: EM | Admit: 2023-02-27 | Discharge: 2023-02-28 | Disposition: A | Discharge status: Home / Self Care | Payer: No Typology Code available for payment source | Attending: Emergency Medicine | Admitting: Emergency Medicine

## 2023-02-27 DIAGNOSIS — Z96659 Presence of unspecified artificial knee joint: Secondary | ICD-10-CM | POA: Insufficient documentation

## 2023-02-27 DIAGNOSIS — I10 Essential (primary) hypertension: Secondary | ICD-10-CM | POA: Diagnosis not present

## 2023-02-27 DIAGNOSIS — Z8507 Personal history of malignant neoplasm of pancreas: Secondary | ICD-10-CM | POA: Insufficient documentation

## 2023-02-27 DIAGNOSIS — I517 Cardiomegaly: Secondary | ICD-10-CM | POA: Diagnosis not present

## 2023-02-27 DIAGNOSIS — D649 Anemia, unspecified: Secondary | ICD-10-CM | POA: Diagnosis not present

## 2023-02-27 DIAGNOSIS — I2510 Atherosclerotic heart disease of native coronary artery without angina pectoris: Secondary | ICD-10-CM | POA: Diagnosis not present

## 2023-02-27 DIAGNOSIS — Z79899 Other long term (current) drug therapy: Secondary | ICD-10-CM | POA: Diagnosis not present

## 2023-02-27 DIAGNOSIS — R600 Localized edema: Secondary | ICD-10-CM | POA: Diagnosis present

## 2023-02-27 DIAGNOSIS — Z7901 Long term (current) use of anticoagulants: Secondary | ICD-10-CM | POA: Insufficient documentation

## 2023-02-27 DIAGNOSIS — R0602 Shortness of breath: Secondary | ICD-10-CM | POA: Diagnosis not present

## 2023-02-27 NOTE — ED Triage Notes (Addendum)
POV from home, A&O x 4, cane at baseline  C/o cont swelling in lower extremities from previous visit earlier in the week, increased swelling in LLE and right arm.

## 2023-02-27 NOTE — ED Provider Notes (Signed)
[place] EMERGENCY DEPARTMENT AT [facility] Provider Note   CSN: [id number] Arrival date & time: 02/27/23  2220     History {Add pertinent medical, surgical, social history, OB history to HPI:1} Chief Complaint  Patient presents with   Leg Swelling    Marcus [name] is a 82 y.o. male.  The history is provided by the patient.  Patient with history of pancreatic adenocarcinoma, CAD Presents with increasing bilateral leg swelling and right arm swelling.  He reports mild fatigue, and shortness of breath.  No orthopnea.  He does have mild dyspnea on exertion.  He reports mild lightheadedness upon standing.  No syncope.  No chest pain.  Patient recently seen for swelling in his legs and had negative CT scan, was discharged with diuretic. Over the past 2 days days he has had increasing swelling in the right upper extremity.  Reports previous injury to the elbow last month, but the swelling is worse this weekend  Denies any new falls or injuries Past Medical History:  Diagnosis Date   Acute meniscal tear of knee left   Arthritis    generalized   AV block 08/01/2018   Benign essential hypertension 01/18/2007   Bronchiectasis with acute exacerbation 06/23/2020   Chest pain, atypical 08/23/2014   Chronic cough 10/07/2014   Coronary artery disease    Degeneration of lumbar intervertebral disc 10/14/2017   Diverticulosis of colon 07/07/2005   Elevated PSA 06/19/2014   GERD (gastroesophageal reflux disease) 01/18/2007   Gout, unspecified 01/18/2007   Hemorrhoids 01/19/2011   Hiatal hernia    History of colonic polyps 01/18/2007   Insomnia, unspecified 08/21/2007   Iron deficiency anemia, unspecified  01/19/2011   Lumbar post-laminectomy syndrome 10/14/2017   Lumbar spondylolysis    Lumbar strain 12/14/2011   Obstructive sleep apnea 01/18/2007   uses CPAP nightly   Paraesophageal hernia    large   Primary osteoarthritis of knee 05/23/2015   Prostate cancer (HCC) 2016    S/P knee replacement 05/23/2015   Sensorineural hearing loss, bilateral    Vitamin B12 deficiency    Vitamin D deficiency 10/28/2009    Home Medications Prior to Admission medications   Medication Sig Start Date End Date Taking? Authorizing Provider  acetaminophen (TYLENOL) 325 MG tablet Take 2 tablets (650 mg total) by mouth every 6 (six) hours as needed for mild pain (or Fever >/= 101). 01/13/23   Sheikh, Omair Latif, DO  allopurinol (ZYLOPRIM) 300 MG tablet Take 1 tablet (300 mg total) by mouth daily. 05/25/22   Nafziger, Kandee Keen, NP  amLODipine (NORVASC) 10 MG tablet Take 1 tablet (10 mg total) by mouth daily. 05/25/22   Nafziger, Kandee Keen, NP  apixaban (ELIQUIS) 5 MG TABS tablet Take 1 tablet (5 mg total) by mouth 2 (two) times daily. 02/23/23 05/24/23  Nafziger, Kandee Keen, NP  Calcium Carb-Cholecalciferol (CALCIUM 600 + D PO) Take 1 tablet by mouth daily. 800 units of vitamin D    [provider]  docusate sodium (COLACE) 100 MG capsule Take 1 capsule (100 mg total) by mouth 2 (two) times daily. 02/15/23 05/16/23  Nafziger, Kandee Keen, NP  esomeprazole (NEXIUM) 40 MG capsule Take 1 capsule (40 mg total) by mouth 2 (two) times daily before a meal. 12/24/22     Eszopiclone 3 MG TABS Take 1 tablet (3 mg total) by mouth at bedtime. Take immediately before bedtime 02/23/23   Nafziger, Kandee Keen, NP  furosemide (LASIX) 40 MG tablet Take 1 tablet (40 mg total) by mouth daily. 02/15/23  Nafziger, Kandee Keen, NP  guaiFENesin (MUCINEX) 600 MG 12 hr tablet Take 600 mg by mouth 2 (two) times daily as needed for cough.    [provider]  hydrOXYzine (ATARAX) 50 MG tablet Take 1 tablet (50 mg total) by mouth 3 (three) times daily as needed. 12/23/22 03/23/23  Nafziger, Kandee Keen, NP  ipratropium (ATROVENT) 0.03 % nasal spray Place 2 sprays into both nostrils every 12 (twelve) hours. 05/03/22     lidocaine-prilocaine (EMLA) cream Apply 1 Application topically as needed. 12/31/22   [name], MD  Multiple Vitamin  (MULTIVITAMIN) tablet Take 1 tablet by mouth in the morning.    [provider]  ondansetron (ZOFRAN) 8 MG tablet Take 1 tablet (8 mg total) by mouth every 8 (eight) hours as needed. 12/31/22     polyethylene glycol powder (GLYCOLAX/MIRALAX) 17 GM/SCOOP powder Take 17g (1 capful) by mouth as directed daily as needed for mild constipation. 12/15/22   Rolly Salter, MD  potassium chloride SA (KLOR-CON M) 20 MEQ tablet Take 1 tablet (20 mEq total) by mouth daily. 01/13/23   Marguerita Merles Latif, DO  prochlorperazine (COMPAZINE) 10 MG tablet Take 1 tablet (10 mg total) by mouth every 6 (six) hours as needed for nausea or vomiting 12/31/22     Respiratory Therapy Supplies (FLUTTER) DEVI 1 each by Does not apply route in the morning and at bedtime. 10/11/19   Karie Fetch P, DO  sodium chloride HYPERTONIC 3 % nebulizer solution Take by nebulization as needed for other. Patient taking differently: Take 4 mLs by nebulization as needed for other. 04/01/22   [name], NP  sucralfate (CARAFATE) 1 GM/10ML suspension Take 10 mLs (1 g total) by mouth 2 (two) times daily. 12/23/22 12/23/23  Mansouraty, Netty Starring., MD  tadalafil (CIALIS) 20 MG tablet Take 0.5-1 tablets (10-20 mg total) by mouth daily as needed 1-2 hours before planned activity. 10/04/22     tamsulosin (FLOMAX) 0.4 MG CAPS capsule Take 0.4 mg by mouth daily. 03/04/16   [provider]  valsartan-hydrochlorothiazide (DIOVAN-HCT) 320-25 MG tablet Take 1 tablet by mouth daily. 11/10/22   Nafziger, Kandee Keen, NP  Vibegron (GEMTESA) 75 MG TABS Take 1 tablet (75 mg total) by mouth daily. 11/24/22         Allergies    Patient has no known allergies.    Review of Systems   Review of Systems  Constitutional:  Positive for fatigue.       Patient reports minimal weight gain  Respiratory:  Positive for shortness of breath.   Cardiovascular:  Positive for leg swelling. Negative for chest pain.  Gastrointestinal:  Negative for vomiting.   Neurological:  Positive for light-headedness. Negative for syncope.    Physical Exam Updated Vital Signs BP (!) 112/59 (BP Location: Right Arm)   Pulse 61   Temp 97.9 F (36.6 C)   Resp 20   Ht 1.727 m (5\' 8" )   Wt 96.2 kg   SpO2 100%   BMI 32.23 kg/m  Physical Exam CONSTITUTIONAL: Elderly no acute distress HEAD: Normocephalic/atraumatic EYES: EOMI/PERRL ENMT: Mucous membranes moist there is no facial edema noted NECK: supple no meningeal signs SPINE/BACK:entire spine nontender CV: S1/S2 noted, no murmurs/rubs/gallops noted LUNGS: Lungs are clear to auscultation bilaterally, no apparent distress ABDOMEN: soft, nontender, no rebound or guarding, bowel sounds noted throughout abdomen GU:no cva tenderness NEURO: Pt is awake/alert/appropriate, moves all extremitiesx4.  No facial droop.  No focal weakness EXTREMITIES: pulses normal/equal, full ROM 3+ pitting edema  to bilateral lower extremities that is symmetric No erythema, no discoloration. Pitting edema noted to the right upper extremity distally in the elbow and right forearm Distal pulses are intact.  No discoloration. *** SKIN: warm, color normal PSYCH: no abnormalities of mood noted, alert and oriented to situation  ED Results / Procedures / Treatments   Labs (all labs ordered are listed, but only abnormal results are displayed) Labs Reviewed  BRAIN NATRIURETIC PEPTIDE  CBC  COMPREHENSIVE METABOLIC PANEL    EKG None  Radiology No results found.  Procedures Procedures  {Document cardiac monitor, telemetry assessment procedure when appropriate:1}  Medications Ordered in ED Medications - No data to display  ED Course/ Medical Decision Making/ A&P   {   Click here for ABCD2, HEART and other calculatorsREFRESH Note before signing :1}                              Medical Decision Making Amount and/or Complexity of Data Reviewed Labs: ordered. Radiology: ordered. ECG/medicine tests: ordered.   This  patient presents to the ED for concern of extremity swelling, this involves an extensive number of treatment options, and is a complaint that carries with it a high risk of complications and morbidity.  The differential diagnosis includes but is not limited to CHF, DVT, anasarca  Comorbidities that complicate the patient evaluation: Patient's presentation is complicated by their history of pancreatic carcinoma  Social Determinants of Health: Patient's  multiple ER visits   increases the complexity of managing their presentation  Additional history obtained: Additional history obtained from family Records reviewed  oncology notes reviewed  Lab Tests: I Ordered, and personally interpreted labs.  The pertinent results include:  ***  Imaging Studies ordered: I ordered imaging studies including X-ray chest   I independently visualized and interpreted imaging which showed *** I agree with the radiologist interpretation  Cardiac Monitoring: The patient was maintained on a cardiac monitor.  I personally viewed and interpreted the cardiac monitor which showed an underlying rhythm of:  {cardiac monitor:26849}  Medicines ordered and prescription drug management: I ordered medication including ***  for ***  Reevaluation of the patient after these medicines showed that the patient    {resolved/improved/worsened:23923::"improved"}  Test Considered: Patient is low risk / negative by ***, therefore do not feel that *** is indicated.  Critical Interventions:  ***  Consultations Obtained: I requested consultation with the {consultation:26851}, and discussed  findings as well as pertinent plan - they recommend: ***  Reevaluation: After the interventions noted above, I reevaluated the patient and found that they have :{resolved/improved/worsened:23923::"improved"}  Complexity of problems addressed: Patient's presentation is most consistent with  {RUEA:54098}  Disposition: After consideration  of the diagnostic results and the patient's response to treatment,  I feel that the patent would benefit from {disposition:26850}.     {Document critical care time when appropriate:1} {Document review of labs and clinical decision tools ie heart score, Chads2Vasc2 etc:1}  {Document your independent review of radiology images, and any outside records:1} {Document your discussion with family members, caretakers, and with consultants:1} {Document social determinants of health affecting pt's care:1} {Document your decision making why or why not admission, treatments were needed:1} Final Clinical Impression(s) / ED Diagnoses Final diagnoses:  None    Rx / DC Orders ED Discharge Orders     None

## 2023-02-28 ENCOUNTER — Ambulatory Visit (HOSPITAL_BASED_OUTPATIENT_CLINIC_OR_DEPARTMENT_OTHER)
Admission: RE | Admit: 2023-02-28 | Discharge: 2023-02-28 | Disposition: A | Discharge status: Home / Self Care | Payer: Medicare Other | Source: Ambulatory Visit | Attending: Emergency Medicine | Admitting: Emergency Medicine

## 2023-02-28 ENCOUNTER — Other Ambulatory Visit (HOSPITAL_BASED_OUTPATIENT_CLINIC_OR_DEPARTMENT_OTHER): Payer: Self-pay | Admitting: Emergency Medicine

## 2023-02-28 DIAGNOSIS — R600 Localized edema: Secondary | ICD-10-CM | POA: Diagnosis not present

## 2023-02-28 LAB — COMPREHENSIVE METABOLIC PANEL
ALT: 15 U/L (ref 0–44)
AST: 18 U/L (ref 15–41)
Albumin: 3.4 g/dL — ABNORMAL LOW (ref 3.5–5.0)
Alkaline Phosphatase: 95 U/L (ref 38–126)
Anion gap: 9 (ref 5–15)
BUN: 22 mg/dL (ref 8–23)
CO2: 26 mmol/L (ref 22–32)
Calcium: 8.8 mg/dL — ABNORMAL LOW (ref 8.9–10.3)
Chloride: 105 mmol/L (ref 98–111)
Creatinine, Ser: 1.02 mg/dL (ref 0.61–1.24)
GFR, Estimated: >60 mL/min (ref >60)
Glucose, Bld: 90 mg/dL (ref 70–99)
Potassium: 3.5 mmol/L (ref 3.5–5.1)
Sodium: 140 mmol/L (ref 135–145)
Total Bilirubin: 0.3 mg/dL (ref 0.3–1.2)
Total Protein: 6 g/dL — ABNORMAL LOW (ref 6.5–8.1)

## 2023-02-28 LAB — CBC
HCT: 26.5 % — ABNORMAL LOW (ref 39.0–52.0)
Hemoglobin: 8.5 g/dL — ABNORMAL LOW (ref 13.0–17.0)
MCH: 28.3 pg (ref 26.0–34.0)
MCHC: 32.1 g/dL (ref 30.0–36.0)
MCV: 88.3 fL (ref 80.0–100.0)
Platelets: 237 10*3/uL (ref 150–400)
RBC: 3 MIL/uL — ABNORMAL LOW (ref 4.22–5.81)
RDW: 15.2 % (ref 11.5–15.5)
WBC: 4.3 10*3/uL (ref 4.0–10.5)
nRBC: 0 % (ref 0.0–0.2)

## 2023-02-28 LAB — BRAIN NATRIURETIC PEPTIDE: B Natriuretic Peptide: 60 pg/mL (ref 0.0–100.0)

## 2023-02-28 NOTE — ED Notes (Signed)
Pt ambulated to bathroom with assistance d/t slightly unbalanced gait

## 2023-02-28 NOTE — Progress Notes (Signed)
Remote pacemaker transmission.   

## 2023-02-28 NOTE — ED Notes (Signed)
Ultrasound scheduled for today at 1430 for right arm dvt r/u

## 2023-03-02 DIAGNOSIS — J479 Bronchiectasis, uncomplicated: Secondary | ICD-10-CM | POA: Diagnosis not present

## 2023-03-02 DIAGNOSIS — K649 Unspecified hemorrhoids: Secondary | ICD-10-CM | POA: Diagnosis not present

## 2023-03-02 DIAGNOSIS — G4700 Insomnia, unspecified: Secondary | ICD-10-CM | POA: Diagnosis not present

## 2023-03-03 NOTE — Progress Notes (Signed)
[facility] OFFICE PROGRESS NOTE   Diagnosis: Pancreas cancer  INTERVAL HISTORY:   Marcus [name] complete another cycle of gemcitabine/Abraxane 02/17/2023.  No fever or rash.  He reports feeling very well at present. He was seen in the emergency room on 02/23/2023 and 02/27/2023 with leg swelling.  He also had swelling in the right arm.  He underwent a right upper extremity Doppler 02/28/2023.  This was negative for DVT, but revealed a fluid collection at the right elbow. A CT venogram on 02/23/2023 revealed no evidence of DVT.  The primary pancreas mass with abutment and encasement of the SMV and portal vein was noted.   Objective:  Vital signs in last 24 hours:  Blood pressure 137/65, pulse 63, temperature 98.1 F (36.7 C), temperature source Oral, resp. rate 18, height 5\' 8"  (1.727 m), weight 217 lb 8 oz (98.7 kg), SpO2 100%.    HEENT: No thrush or ulcers Resp: Lungs clear bilaterally, no respiratory distress Cardio: Regular rate and rhythm GI: No mass, nontender, no hepatosplenomegaly, no apparent ascites Vascular: 1+ pitting edema below the knee bilaterally  Skin: Hyperpigmentation at the left pretibial region with abrasions  Portacath/PICC-without erythema  Lab Results:  Lab Results  Component Value Date   WBC 5.1 03/04/2023   HGB 8.7 (L) 03/04/2023   HCT 27.6 (L) 03/04/2023   MCV 88.5 03/04/2023   PLT 305 03/04/2023   NEUTROABS 2.9 03/04/2023    CMP  Lab Results  Component Value Date   NA 140 03/04/2023   K 3.5 03/04/2023   CL 107 03/04/2023   CO2 25 03/04/2023   GLUCOSE 117 (H) 03/04/2023   BUN 20 03/04/2023   CREATININE 0.95 03/04/2023   CALCIUM 8.3 (L) 03/04/2023   PROT 6.3 (L) 03/04/2023   ALBUMIN 3.3 (L) 03/04/2023   AST 16 03/04/2023   ALT 14 03/04/2023   ALKPHOS 98 03/04/2023   BILITOT 0.3 03/04/2023   GFRNONAA >60 03/04/2023   GFRAA >60 02/07/2019    Lab Results  Component Value Date   CEA1 6.4 (H) 12/11/2022   CEA 1.1  03/03/2010   ZOX096 840 (H) 02/17/2023     Medications: I have reviewed the patient's current medications.   Assessment/Plan: Pancreas cancer CT abdomen/pelvis 12/03/2022-suspected pancreas mass in the proximal body measuring 2 cm with probable mass effect on the superior aspect of the SMV, multiple hypodense liver lesions, many are new from a remote CT-indeterminate, 2 cystic lesions in the pancreas tail-nonspecific prior bariatric surgery ERCP 12/13/2022-malignant appearing common bile duct stricture, plastic stent placed, brushing cytology-no malignant cells EUS 12/23/2022-30 x 33 mm pancreas head mass with invasion of the SMV, no malignant appearing lymph nodes, 4 cysts in the visualized liver, FNA-adenocarcinoma Cycle 1 gemcitabine and abraxane 01/07/2023 CTs 01/09/2023-multiple low-attenuation liver lesions unchanged, largest of which are clearly benign fluid attenuation cyst, others more ill-defined and incompletely characterized suspicious for small metastases.  Similar appearance of hypodense mass in the pancreatic head.  Distended gallbladder with mild gallbladder wall thickening and adjacent pericholecystic fat stranding.  Sludge in the gallbladder fundus.  Common bile duct stent in place with diminished, persistent intrahepatic biliary ductal dilatation.  Negative for pulmonary embolism.  Acute appearing heterogeneous airspace opacity of the superior segment left lower lobe consistent with infection or aspiration. Cycle 2 gemcitabine and abraxane 01/21/2023, gemcitabine dose reduced due to neutropenia following cycle 1 Cycle 3 gemcitabine/Abraxane 02/04/2023 Cycle 4 gemcitabine/Abraxane 02/17/2023 Cycle 5 gemcitabine/Abraxane 03/04/2023 Biliary obstruction secondary to 1-status post ERCP/stent placement 12/13/2022  Coronary artery disease Atrial fibrillation Gout Sleep apnea Status post bariatric surgery Hypertension Bronchiectasis Gastroesophageal reflux disease History of colon cancer in  his 40s Complete heart block-pacemaker placed Admission 01/09/2023 with a fever and failure to thrive, CT chest with evidence of left lung pneumonia, CT abdomen/pelvis with a distended gallbladder and adjacent fat stranding Anemia   Disposition: Marcus [name] appears stable.  He has completed 5 treatments with gemcitabine/Abraxane.  He has tolerated the treatment well.  The CA 19-9 was lower when he was here on 02/17/2023.  We will repeat a CA 19-9 when he returns in 2 weeks.  The plan is to complete 6 cycles of gemcitabine/Abraxane prior to a restaging CT evaluation.  There was no evidence of disease progression on the CT venogram 02/23/2023.  The lower extremity edema is likely multifactorial.  The edema is likely secondary to hypoalbuminemia, anemia, polypharmacy (he will discontinue amlodipine), and potentially heart disease.  I recommended he elevate the legs and try support stockings.  He will return for an office visit and chemotherapy in 2 weeks.  Thornton Papas, MD  03/04/2023  1:56 PM

## 2023-03-04 ENCOUNTER — Encounter: Payer: Self-pay | Admitting: *Deleted

## 2023-03-04 ENCOUNTER — Other Ambulatory Visit: Payer: Medicare Other

## 2023-03-04 ENCOUNTER — Inpatient Hospital Stay: Payer: No Typology Code available for payment source

## 2023-03-04 ENCOUNTER — Inpatient Hospital Stay (HOSPITAL_BASED_OUTPATIENT_CLINIC_OR_DEPARTMENT_OTHER): Payer: No Typology Code available for payment source | Admitting: Oncology

## 2023-03-04 ENCOUNTER — Encounter: Payer: Self-pay | Admitting: Oncology

## 2023-03-04 VITALS — BP 137/65 | HR 63 | Temp 98.1°F | Resp 18 | Ht 68.0 in | Wt 217.5 lb

## 2023-03-04 DIAGNOSIS — Z85038 Personal history of other malignant neoplasm of large intestine: Secondary | ICD-10-CM | POA: Diagnosis not present

## 2023-03-04 DIAGNOSIS — C259 Malignant neoplasm of pancreas, unspecified: Secondary | ICD-10-CM

## 2023-03-04 DIAGNOSIS — I10 Essential (primary) hypertension: Secondary | ICD-10-CM | POA: Diagnosis not present

## 2023-03-04 DIAGNOSIS — G4730 Sleep apnea, unspecified: Secondary | ICD-10-CM | POA: Diagnosis not present

## 2023-03-04 DIAGNOSIS — D649 Anemia, unspecified: Secondary | ICD-10-CM | POA: Diagnosis not present

## 2023-03-04 DIAGNOSIS — M109 Gout, unspecified: Secondary | ICD-10-CM | POA: Diagnosis not present

## 2023-03-04 DIAGNOSIS — Z9884 Bariatric surgery status: Secondary | ICD-10-CM | POA: Diagnosis not present

## 2023-03-04 DIAGNOSIS — I4891 Unspecified atrial fibrillation: Secondary | ICD-10-CM | POA: Diagnosis not present

## 2023-03-04 DIAGNOSIS — K219 Gastro-esophageal reflux disease without esophagitis: Secondary | ICD-10-CM | POA: Diagnosis not present

## 2023-03-04 DIAGNOSIS — C250 Malignant neoplasm of head of pancreas: Secondary | ICD-10-CM | POA: Diagnosis not present

## 2023-03-04 DIAGNOSIS — I2510 Atherosclerotic heart disease of native coronary artery without angina pectoris: Secondary | ICD-10-CM | POA: Diagnosis not present

## 2023-03-04 DIAGNOSIS — Z5111 Encounter for antineoplastic chemotherapy: Secondary | ICD-10-CM | POA: Diagnosis not present

## 2023-03-04 LAB — CMP (CANCER CENTER ONLY)
ALT: 14 U/L (ref 0–44)
AST: 16 U/L (ref 15–41)
Albumin: 3.3 g/dL — ABNORMAL LOW (ref 3.5–5.0)
Alkaline Phosphatase: 98 U/L (ref 38–126)
Anion gap: 8 (ref 5–15)
BUN: 20 mg/dL (ref 8–23)
CO2: 25 mmol/L (ref 22–32)
Calcium: 8.3 mg/dL — ABNORMAL LOW (ref 8.9–10.3)
Chloride: 107 mmol/L (ref 98–111)
Creatinine: 0.95 mg/dL (ref 0.61–1.24)
GFR, Estimated: >60 mL/min (ref >60)
Glucose, Bld: 117 mg/dL — ABNORMAL HIGH (ref 70–99)
Potassium: 3.5 mmol/L (ref 3.5–5.1)
Sodium: 140 mmol/L (ref 135–145)
Total Bilirubin: 0.3 mg/dL (ref 0.3–1.2)
Total Protein: 6.3 g/dL — ABNORMAL LOW (ref 6.5–8.1)

## 2023-03-04 LAB — CBC WITH DIFFERENTIAL (CANCER CENTER ONLY)
Abs Immature Granulocytes: 0.1 10*3/uL — ABNORMAL HIGH (ref 0.00–0.07)
Basophils Absolute: 0 10*3/uL (ref 0.0–0.1)
Basophils Relative: 1 %
Eosinophils Absolute: 0.1 10*3/uL (ref 0.0–0.5)
Eosinophils Relative: 3 %
HCT: 27.6 % — ABNORMAL LOW (ref 39.0–52.0)
Hemoglobin: 8.7 g/dL — ABNORMAL LOW (ref 13.0–17.0)
Immature Granulocytes: 2 %
Lymphocytes Relative: 25 %
Lymphs Abs: 1.3 10*3/uL (ref 0.7–4.0)
MCH: 27.9 pg (ref 26.0–34.0)
MCHC: 31.5 g/dL (ref 30.0–36.0)
MCV: 88.5 fL (ref 80.0–100.0)
Monocytes Absolute: 0.6 10*3/uL (ref 0.1–1.0)
Monocytes Relative: 13 %
Neutro Abs: 2.9 10*3/uL (ref 1.7–7.7)
Neutrophils Relative %: 56 %
Platelet Count: 305 10*3/uL (ref 150–400)
RBC: 3.12 MIL/uL — ABNORMAL LOW (ref 4.22–5.81)
RDW: 15.8 % — ABNORMAL HIGH (ref 11.5–15.5)
WBC Count: 5.1 10*3/uL (ref 4.0–10.5)
nRBC: 0 % (ref 0.0–0.2)

## 2023-03-04 MED ORDER — HEPARIN SOD (PORK) LOCK FLUSH 100 UNIT/ML IV SOLN
500.0000 [IU] | Freq: Once | INTRAVENOUS | Status: AC | PRN
  Administered 2023-03-04: 500 [IU]

## 2023-03-04 MED ORDER — SODIUM CHLORIDE 0.9 % IV SOLN
800.0000 mg/m2 | Freq: Once | INTRAVENOUS | Status: AC
  Administered 2023-03-04: 1710 mg via INTRAVENOUS
  Filled 2023-03-04: qty 25.98

## 2023-03-04 MED ORDER — SODIUM CHLORIDE 0.9% FLUSH
10.0000 mL | INTRAVENOUS | Status: DC | PRN
  Administered 2023-03-04: 10 mL

## 2023-03-04 MED ORDER — PROCHLORPERAZINE MALEATE 10 MG PO TABS
10.0000 mg | ORAL_TABLET | Freq: Once | ORAL | Status: AC
  Administered 2023-03-04: 10 mg via ORAL
  Filled 2023-03-04: qty 1

## 2023-03-04 MED ORDER — SODIUM CHLORIDE 0.9 % IV SOLN: Freq: Once | INTRAVENOUS | Status: AC

## 2023-03-04 MED ORDER — PACLITAXEL PROTEIN-BOUND CHEMO INJECTION 100 MG
100.0000 mg/m2 | Freq: Once | INTRAVENOUS | Status: AC
  Administered 2023-03-04: 200 mg via INTRAVENOUS
  Filled 2023-03-04: qty 40

## 2023-03-04 NOTE — Patient Instructions (Signed)
[facility]   Discharge Instructions: Thank you for choosing [facility] to provide your oncology and hematology care.   If you have a lab appointment with the Cancer Center, please go directly to the Cancer Center and check in at the registration area.   Wear comfortable clothing and clothing appropriate for easy access to any Portacath or PICC line.   We strive to give you quality time with your provider. You may need to reschedule your appointment if you arrive late (15 or more minutes).  Arriving late affects you and other patients whose appointments are after yours.  Also, if you miss three or more appointments without notifying the office, you may be dismissed from the clinic at the provider's discretion.      For prescription refill requests, have your pharmacy contact our office and allow 72 hours for refills to be completed.    Today you received the following chemotherapy and/or immunotherapy agents Paclitaxel-protein bound (ABRAXANE) & Gemcitabine (GEMZAR).   To help prevent nausea and vomiting after your treatment, we encourage you to take your nausea medication as directed.  BELOW ARE SYMPTOMS THAT SHOULD BE REPORTED IMMEDIATELY: *FEVER GREATER THAN 100.4 F (38 C) OR HIGHER *CHILLS OR SWEATING *NAUSEA AND VOMITING THAT IS NOT CONTROLLED WITH YOUR NAUSEA MEDICATION *UNUSUAL SHORTNESS OF BREATH *UNUSUAL BRUISING OR BLEEDING *URINARY PROBLEMS (pain or burning when urinating, or frequent urination) *BOWEL PROBLEMS (unusual diarrhea, constipation, pain near the anus) TENDERNESS IN MOUTH AND THROAT WITH OR WITHOUT PRESENCE OF ULCERS (sore throat, sores in mouth, or a toothache) UNUSUAL RASH, SWELLING OR PAIN  UNUSUAL VAGINAL DISCHARGE OR ITCHING   Items with * indicate a potential emergency and should be followed up as soon as possible or go to the Emergency Department if any problems should occur.  Please show the CHEMOTHERAPY  ALERT CARD or IMMUNOTHERAPY ALERT CARD at check-in to the Emergency Department and triage nurse.  Should you have questions after your visit or need to cancel or reschedule your appointment, please contact [facility]  Dept: [phone]  and follow the prompts.  Office hours are 8:00 a.m. to 4:30 p.m. Monday - Friday. Please note that voicemails left after 4:00 p.m. may not be returned until the following business day.  We are closed weekends and major holidays. You have access to a nurse at all times for urgent questions. Please call the main number to the clinic Dept: [phone] and follow the prompts.   For any non-urgent questions, you may also contact your provider using MyChart. We now offer e-Visits for anyone 18 and older to request care online for non-urgent symptoms. For details visit [email or web address].   Also download the MyChart app! Go to the app store, search "MyChart", open the app, select [facility], and log in with your MyChart username and password.  Paclitaxel Nanoparticle Albumin-Bound Injection What is this medication? NANOPARTICLE ALBUMIN-BOUND PACLITAXEL (Na no PAHR ti kuhl al BYOO muhn-bound PAK li TAX el) treats some types of cancer. It works by slowing down the growth of cancer cells. This medicine may be used for other purposes; ask your health care provider or pharmacist if you have questions. COMMON BRAND NAME(S): Abraxane What should I tell my care team before I take this medication? They need to know if you have any of these conditions: Liver disease Low white blood cell levels An unusual or allergic reaction to paclitaxel, albumin, other medications, foods, dyes, or preservatives   If you or your partner are pregnant or trying to get pregnant Breast-feeding How should I use this medication? This medication is injected into a vein. It is given by your care team in a hospital or clinic setting. Talk to your care team  about the use of this medication in children. Special care may be needed. Overdosage: If you think you have taken too much of this medicine contact a poison control center or emergency room at once. NOTE: This medicine is only for you. Do not share this medicine with others. What if I miss a dose? Keep appointments for follow-up doses. It is important not to miss your dose. Call your care team if you are unable to keep an appointment. What may interact with this medication? Other medications may affect the way this medication works. Talk with your care team about all of the medications you take. They may suggest changes to your treatment plan to lower the risk of side effects and to make sure your medications work as intended. This list may not describe all possible interactions. Give your health care provider a list of all the medicines, herbs, non-prescription drugs, or dietary supplements you use. Also tell them if you smoke, drink alcohol, or use illegal drugs. Some items may interact with your medicine. What should I watch for while using this medication? Your condition will be monitored carefully while you are receiving this medication. You may need blood work while taking this medication. This medication may make you feel generally unwell. This is not uncommon as chemotherapy can affect healthy cells as well as cancer cells. Report any side effects. Continue your course of treatment even though you feel ill unless your care team tells you to stop. This medication can cause serious allergic reactions. To reduce the risk, your care team may give you other medications to take before receiving this one. Be sure to follow the directions from your care team. This medication may increase your risk of getting an infection. Call your care team for advice if you get a fever, chills, sore throat, or other symptoms of a cold or flu. Do not treat yourself. Try to avoid being around people who are sick. This  medication may increase your risk to bruise or bleed. Call your care team if you notice any unusual bleeding. Be careful brushing or flossing your teeth or using a toothpick because you may get an infection or bleed more easily. If you have any dental work done, tell your dentist you are receiving this medication. Talk to your care team if you or your partner may be pregnant. Serious birth defects can occur if you take this medication during pregnancy and for 6 months after the last dose. You will need a negative pregnancy test before starting this medication. Contraception is recommended while taking this medication and for 6 months after the last dose. Your care team can help you find the option that works for you. If your partner can get pregnant, use a condom during sex while taking this medication and for 3 months after the last dose. Do not breastfeed while taking this medication and for 2 weeks after the last dose. This medication may cause infertility. Talk to your care team if you are concerned about your fertility. What side effects may I notice from receiving this medication? Side effects that you should report to your care team as soon as possible: Allergic reactions--skin rash, itching, hives, swelling of the face, lips, tongue, or throat Dry cough,   shortness of breath or trouble breathing Infection--fever, chills, cough, sore throat, wounds that don't heal, pain or trouble when passing urine, general feeling of discomfort or being unwell Low red blood cell level--unusual weakness or fatigue, dizziness, headache, trouble breathing Pain, tingling, or numbness in the hands or feet Stomach pain, unusual weakness or fatigue, nausea, vomiting, diarrhea, or fever that lasts longer than expected Unusual bruising or bleeding Side effects that usually do not require medical attention (report to your care team if they continue or are bothersome): Diarrhea Fatigue Hair loss Loss of  appetite Nausea Vomiting This list may not describe all possible side effects. Call your doctor for medical advice about side effects. You may report side effects to FDA at 1-800-FDA-1088. Where should I keep my medication? This medication is given in a hospital or clinic. It will not be stored at home. NOTE: This sheet is a summary. It may not cover all possible information. If you have questions about this medicine, talk to your doctor, pharmacist, or health care provider.  2024 Elsevier/Gold Standard (2021-11-05 00:00:00)  Gemcitabine Injection What is this medication? GEMCITABINE (jem SYE ta been) treats some types of cancer. It works by slowing down the growth of cancer cells. This medicine may be used for other purposes; ask your health care provider or pharmacist if you have questions. COMMON BRAND NAME(S): Gemzar, Infugem What should I tell my care team before I take this medication? They need to know if you have any of these conditions: Blood disorders Infection Kidney disease Liver disease Lung or breathing disease, such as asthma or COPD Recent or ongoing radiation therapy An unusual or allergic reaction to gemcitabine, other medications, foods, dyes, or preservatives If you or your partner are pregnant or trying to get pregnant Breast-feeding How should I use this medication? This medication is injected into a vein. It is given by your care team in a hospital or clinic setting. Talk to your care team about the use of this medication in children. Special care may be needed. Overdosage: If you think you have taken too much of this medicine contact a poison control center or emergency room at once. NOTE: This medicine is only for you. Do not share this medicine with others. What if I miss a dose? Keep appointments for follow-up doses. It is important not to miss your dose. Call your care team if you are unable to keep an appointment. What may interact with this  medication? Interactions have not been studied. This list may not describe all possible interactions. Give your health care provider a list of all the medicines, herbs, non-prescription drugs, or dietary supplements you use. Also tell them if you smoke, drink alcohol, or use illegal drugs. Some items may interact with your medicine. What should I watch for while using this medication? Your condition will be monitored carefully while you are receiving this medication. This medication may make you feel generally unwell. This is not uncommon, as chemotherapy can affect healthy cells as well as cancer cells. Report any side effects. Continue your course of treatment even though you feel ill unless your care team tells you to stop. In some cases, you may be given additional medications to help with side effects. Follow all directions for their use. This medication may increase your risk of getting an infection. Call your care team for advice if you get a fever, chills, sore throat, or other symptoms of a cold or flu. Do not treat yourself. Try to avoid being around   people who are sick. This medication may increase your risk to bruise or bleed. Call your care team if you notice any unusual bleeding. Be careful brushing or flossing your teeth or using a toothpick because you may get an infection or bleed more easily. If you have any dental work done, tell your dentist you are receiving this medication. Avoid taking medications that contain aspirin, acetaminophen, ibuprofen, naproxen, or ketoprofen unless instructed by your care team. These medications may hide a fever. Talk to your care team if you or your partner wish to become pregnant or think you might be pregnant. This medication can cause serious birth defects if taken during pregnancy and for 6 months after the last dose. A negative pregnancy test is required before starting this medication. A reliable form of contraception is recommended while taking  this medication and for 6 months after the last dose. Talk to your care team about effective forms of contraception. Do not father a child while taking this medication and for 3 months after the last dose. Use a condom while having sex during this time period. Do not breastfeed while taking this medication and for at least 1 week after the last dose. This medication may cause infertility. Talk to your care team if you are concerned about your fertility. What side effects may I notice from receiving this medication? Side effects that you should report to your care team as soon as possible: Allergic reactions--skin rash, itching, hives, swelling of the face, lips, tongue, or throat Capillary leak syndrome--stomach or muscle pain, unusual weakness or fatigue, feeling faint or lightheaded, decrease in the amount of urine, swelling of the ankles, hands, or feet, trouble breathing Infection--fever, chills, cough, sore throat, wounds that don't heal, pain or trouble when passing urine, general feeling of discomfort or being unwell Liver injury--right upper belly pain, loss of appetite, nausea, light-colored stool, dark yellow or brown urine, yellowing skin or eyes, unusual weakness or fatigue Low red blood cell level--unusual weakness or fatigue, dizziness, headache, trouble breathing Lung injury--shortness of breath or trouble breathing, cough, spitting up blood, chest pain, fever Stomach pain, bloody diarrhea, pale skin, unusual weakness or fatigue, decrease in the amount of urine, which may be signs of hemolytic uremic syndrome Sudden and severe headache, confusion, change in vision, seizures, which may be signs of posterior reversible encephalopathy syndrome (PRES) Unusual bruising or bleeding Side effects that usually do not require medical attention (report to your care team if they continue or are bothersome): Diarrhea Drowsiness Hair loss Nausea Pain, redness, or swelling with sores inside the  mouth or throat Vomiting This list may not describe all possible side effects. Call your doctor for medical advice about side effects. You may report side effects to FDA at 1-800-FDA-1088. Where should I keep my medication? This medication is given in a hospital or clinic. It will not be stored at home. NOTE: This sheet is a summary. It may not cover all possible information. If you have questions about this medicine, talk to your doctor, pharmacist, or health care provider.  2024 Elsevier/Gold Standard (2021-10-27 00:00:00)  

## 2023-03-04 NOTE — Progress Notes (Signed)
Dr. Truett Perna ordered CA 19.9 to be added today. Not able to add on per lab. Will collect at next visit.

## 2023-03-04 NOTE — Progress Notes (Signed)
Patient seen by Dr. Sherrill today ? ?Vitals are within treatment parameters. ? ?Labs reviewed by Dr. Sherrill and are within treatment parameters. ? ?Per physician team, patient is ready for treatment and there are NO modifications to the treatment plan.  ?

## 2023-03-08 ENCOUNTER — Ambulatory Visit (INDEPENDENT_AMBULATORY_CARE_PROVIDER_SITE_OTHER): Payer: Medicare Other | Admitting: Internal Medicine

## 2023-03-08 DIAGNOSIS — G4733 Obstructive sleep apnea (adult) (pediatric): Secondary | ICD-10-CM

## 2023-03-08 NOTE — Progress Notes (Signed)
No charge. 

## 2023-03-09 ENCOUNTER — Other Ambulatory Visit (HOSPITAL_BASED_OUTPATIENT_CLINIC_OR_DEPARTMENT_OTHER): Payer: Self-pay

## 2023-03-09 ENCOUNTER — Encounter: Payer: Self-pay | Admitting: Adult Health

## 2023-03-09 ENCOUNTER — Ambulatory Visit (INDEPENDENT_AMBULATORY_CARE_PROVIDER_SITE_OTHER): Payer: Medicare Other | Admitting: Adult Health

## 2023-03-09 VITALS — BP 110/60 | HR 67 | Temp 98.3°F | Ht 68.0 in | Wt 220.0 lb

## 2023-03-09 DIAGNOSIS — R600 Localized edema: Secondary | ICD-10-CM

## 2023-03-09 MED ORDER — TORSEMIDE 20 MG PO TABS
20.0000 mg | ORAL_TABLET | Freq: Every day | ORAL | 2 refills | Status: DC
  Filled 2023-03-09: qty 30, 30d supply, fill #0

## 2023-03-09 NOTE — Patient Instructions (Signed)
Stop Lasix and I am going to prescribe you Torsemide to help get this fluid off you

## 2023-03-09 NOTE — Progress Notes (Signed)
Subjective:    Patient ID: Marcus [name], male    DOB: 12-04-1940, 82 y.o.   MRN: [id number]  HPI  82 year old male who  has a past medical history of Acute meniscal tear of knee (left), Arthritis, AV block (08/01/2018), Benign essential hypertension (01/18/2007), Bronchiectasis with acute exacerbation (06/23/2020), Chest pain, atypical (08/23/2014), Chronic cough (10/07/2014), Coronary artery disease, Degeneration of lumbar intervertebral disc (10/14/2017), Diverticulosis of colon (07/07/2005), Elevated PSA (06/19/2014), GERD (gastroesophageal reflux disease) (01/18/2007), Gout, unspecified (01/18/2007), Hemorrhoids (01/19/2011), Hiatal hernia, History of colonic polyps (01/18/2007), Insomnia, unspecified (08/21/2007), Iron deficiency anemia, unspecified  (01/19/2011), Lumbar post-laminectomy syndrome (10/14/2017), Lumbar spondylolysis, Lumbar strain (12/14/2011), Obstructive sleep apnea (01/18/2007), Paraesophageal hernia, Primary osteoarthritis of knee (05/23/2015), Prostate cancer (HCC) (2016), S/P knee replacement (05/23/2015), Sensorineural hearing loss, bilateral, Vitamin B12 deficiency, and Vitamin D deficiency (10/28/2009).  82 year old male who is being evaluated today for follow-up after being seen in the emergency room 10 days ago for increasing bilateral leg swelling and right arm swelling.  Does have known history of pancreatic adenocarcinoma with compression of the SMV.  He had recent CT venogram to rule out clot..  In the emergency room his chest x-ray and labs are reassuring he did have a mild hypoalbuminemia could contribute to his edema.  He did have ultrasound venous of the right upper extremity after being seen in the emergency room and this was negative for DVT.  He is taking Lasix 40 mg daily but does not notice any improvement in his lower extremity edema.  He is elevating his legs which seem to help where they are elevated.  Legs are becoming painful from the edema and also has  developed some blisters.   Review of Systems See HPI   Past Medical History:  Diagnosis Date   Acute meniscal tear of knee left   Arthritis    generalized   AV block 08/01/2018   Benign essential hypertension 01/18/2007   Bronchiectasis with acute exacerbation 06/23/2020   Chest pain, atypical 08/23/2014   Chronic cough 10/07/2014   Coronary artery disease    Degeneration of lumbar intervertebral disc 10/14/2017   Diverticulosis of colon 07/07/2005   Elevated PSA 06/19/2014   GERD (gastroesophageal reflux disease) 01/18/2007   Gout, unspecified 01/18/2007   Hemorrhoids 01/19/2011   Hiatal hernia    History of colonic polyps 01/18/2007   Insomnia, unspecified 08/21/2007   Iron deficiency anemia, unspecified  01/19/2011   Lumbar post-laminectomy syndrome 10/14/2017   Lumbar spondylolysis    Lumbar strain 12/14/2011   Obstructive sleep apnea 01/18/2007   uses CPAP nightly   Paraesophageal hernia    large   Primary osteoarthritis of knee 05/23/2015   Prostate cancer (HCC) 2016   S/P knee replacement 05/23/2015   Sensorineural hearing loss, bilateral    Vitamin B12 deficiency    Vitamin D deficiency 10/28/2009    Social History   Socioeconomic History   Marital status: Widowed    Spouse name: Not on file   Number of children: 4   Years of education: 65   Highest education level: Some college, no degree  Occupational History   Occupation: retired    Associate Professor: [employer]  Tobacco Use   Smoking status: Former    Current packs/day: 0.00    Average packs/day: 2.0 packs/day for 20.0 years (40.0 ttl pk-yrs)    Types: Cigarettes    Start date: 07/05/1954    Quit date: 07/05/1974    Years since quitting: 48.7  Smokeless tobacco: Never  Vaping Use   Vaping status: Never Used  Substance and Sexual Activity   Alcohol use: Not Currently   Drug use: No   Sexual activity: Not Currently  Other Topics Concern   Not on file  Social History Narrative   Recently  widowed   Former Smoker    Alcohol use-yes 1-2 drinks per day      Occupation: Retired Associate Professor      Originally from [place], [place] - in [place] > 10 yrs as of 2017      Social Determinants of Health   Financial Resource Strain: Low Risk  (07/28/2021)   Overall Financial Resource Strain (CARDIA)    Difficulty of Paying Living Expenses: Not hard at all  Food Insecurity: No Food Insecurity (01/14/2023)   Hunger Vital Sign    Worried About Running Out of Food in the Last Year: Never true    Ran Out of Food in the Last Year: Never true  Transportation Needs: No Transportation Needs (01/14/2023)   PRAPARE - Administrator, Civil Service (Medical): No    Lack of Transportation (Non-Medical): No  Physical Activity: Inactive (07/28/2021)   Exercise Vital Sign    Days of Exercise per Week: 0 days    Minutes of Exercise per Session: 0 min  Stress: No Stress Concern Present (07/28/2021)   [employer] of Occupational Health - Occupational Stress Questionnaire    Feeling of Stress : Not at all  Social Connections: Moderately Isolated (08/04/2020)   Social Connection and Isolation Panel [NHANES]    Frequency of Communication with Friends and Family: Three times a week    Frequency of Social Gatherings with Friends and Family: More than three times a week    Attends Religious Services: More than 4 times per year    Active Member of Clubs or Organizations: No    Attends Banker Meetings: Never    Marital Status: Widowed  Intimate Partner Violence: Not At Risk (01/10/2023)   Humiliation, Afraid, Rape, and Kick questionnaire    Fear of Current or Ex-Partner: No    Emotionally Abused: No    Physically Abused: No    Sexually Abused: No    Past Surgical History:  Procedure Laterality Date   BILIARY BRUSHING  12/13/2022   Procedure: BILIARY BRUSHING;  Surgeon: Iva Boop, MD;  Location: [facility] ENDOSCOPY;  Service: Gastroenterology;;   BILIARY STENT PLACEMENT  12/13/2022    Procedure: BILIARY STENT PLACEMENT;  Surgeon: Iva Boop, MD;  Location: [facility] ENDOSCOPY;  Service: Gastroenterology;;   BILIARY STENT PLACEMENT N/A 02/07/2023   Procedure: BILIARY STENT PLACEMENT;  Surgeon: Lemar Lofty., MD;  Location: [facility] ENDOSCOPY;  Service: Gastroenterology;  Laterality: N/A;   BIOPSY  12/23/2022   Procedure: BIOPSY;  Surgeon: Lemar Lofty., MD;  Location: [facility] ENDOSCOPY;  Service: Gastroenterology;;   BRONCHIAL WASHINGS  10/30/2019   Procedure: BRONCHIAL WASHINGS;  Surgeon: [name], DO;  Location: [facility] ENDOSCOPY;  Service: Endoscopy;;   CARDIAC CATHETERIZATION     CATARACT EXTRACTION W/ INTRAOCULAR LENS  IMPLANT, BILATERAL Bilateral    COLONOSCOPY     ENDOSCOPIC RETROGRADE CHOLANGIOPANCREATOGRAPHY (ERCP) WITH PROPOFOL N/A 02/07/2023   Procedure: ENDOSCOPIC RETROGRADE CHOLANGIOPANCREATOGRAPHY (ERCP) WITH PROPOFOL;  Surgeon: Lemar Lofty., MD;  Location: [facility] ENDOSCOPY;  Service: Gastroenterology;  Laterality: N/A;  Stent change   ERCP N/A 12/13/2022   Procedure: ENDOSCOPIC RETROGRADE CHOLANGIOPANCREATOGRAPHY (ERCP);  Surgeon: Iva Boop, MD;  Location: [facility] ENDOSCOPY;  Service: Gastroenterology;  Laterality: N/A;  with stent placement   ESOPHAGOGASTRODUODENOSCOPY     ESOPHAGOGASTRODUODENOSCOPY (EGD) WITH PROPOFOL N/A 12/23/2022   Procedure: ESOPHAGOGASTRODUODENOSCOPY (EGD) WITH PROPOFOL;  Surgeon: Meridee Score Netty Starring., MD;  Location: [facility] ENDOSCOPY;  Service: Gastroenterology;  Laterality: N/A;   EUS N/A 12/23/2022   Procedure: UPPER ENDOSCOPIC ULTRASOUND (EUS) RADIAL;  Surgeon: Lemar Lofty., MD;  Location: [facility] ENDOSCOPY;  Service: Gastroenterology;  Laterality: N/A;   FINE NEEDLE ASPIRATION  12/23/2022   Procedure: FINE NEEDLE ASPIRATION (FNA) RADIAL;  Surgeon: Lemar Lofty., MD;  Location: [facility] ENDOSCOPY;  Service: Gastroenterology;;   IR IMAGING GUIDED PORT INSERTION  01/04/2023   KNEE ARTHROSCOPY Right 2007   KNEE ARTHROSCOPY   07/13/2011   Procedure: ARTHROSCOPY KNEE;  Surgeon: Erasmo Leventhal;  Location: [facility];  Service: Orthopedics;  Laterality: Left;  partial menisectomy with chondrylplasty   KNEE SURGERY Left    LAMINECTOMY AND MICRODISCECTOMY LUMBAR SPINE  MARCH  2008   L3 -  4   LAPAROSCOPIC INCISIONAL / UMBILICAL / VENTRAL HERNIA REPAIR  2006   PACEMAKER IMPLANT N/A 08/02/2018   Procedure: PACEMAKER IMPLANT;  Surgeon: Marinus Maw, MD;  Location: [facility]VASIVE [facility];  Service: Cardiovascular;  Laterality: N/A;   PROSTATE BIOPSY     RADIOACTIVE SEED IMPLANT N/A 02/12/2019   Procedure: RADIOACTIVE SEED IMPLANT/BRACHYTHERAPY IMPLANT;  Surgeon: Marcine Matar, MD;  Location: [facility] ORS;  Service: Urology;  Laterality: N/A;  90 MINS   REMOVAL OF STONES  02/07/2023   Procedure: REMOVAL OF Sludge;  Surgeon: Meridee Score Netty Starring., MD;  Location: [facility] ENDOSCOPY;  Service: Gastroenterology;;   SHOULDER ARTHROSCOPY Right 05-23-2007   SIGMOID COLECTOMY FOR CANCER  1989   SPACE OAR INSTILLATION N/A 02/12/2019   Procedure: SPACE OAR INSTILLATION;  Surgeon: Marcine Matar, MD;  Location: [facility] ORS;  Service: Urology;  Laterality: N/A;   SPHINCTEROTOMY  12/13/2022   Procedure: SPHINCTEROTOMY;  Surgeon: Iva Boop, MD;  Location: [facility] ENDOSCOPY;  Service: Gastroenterology;;   Francine Graven REMOVAL  02/07/2023   Procedure: STENT REMOVAL;  Surgeon: Lemar Lofty., MD;  Location: [facility] ENDOSCOPY;  Service: Gastroenterology;;   TOTAL KNEE ARTHROPLASTY Left 05/23/2015   Procedure: LEFT TOTAL KNEE ARTHROPLASTY;  Surgeon: Eugenia Mcalpine, MD;  Location: [facility] ORS;  Service: Orthopedics;  Laterality: Left;   VERTICAL BANDED GASTROPLASTY  1986   VIDEO BRONCHOSCOPY N/A 10/30/2019   Procedure: VIDEO BRONCHOSCOPY WITH BRONICAL ALVEROLAR LAVAGE WITHOUT FLUORO;  Surgeon: [name], DO;  Location: [facility] ENDOSCOPY;  Service: Endoscopy;  Laterality: N/A;    Family History  Problem Relation Age of Onset   Stroke  Paternal Grandfather    Heart disease Paternal Grandfather    Esophagitis Father        died from perforated esophagus   Breast cancer Mother    Colon cancer Maternal Grandmother        questionable   Esophageal cancer Neg Hx    Prostate cancer Neg Hx    Stomach cancer Neg Hx     No Known Allergies  Current Outpatient Medications on File Prior to Visit  Medication Sig Dispense Refill   acetaminophen (TYLENOL) 325 MG tablet Take 2 tablets (650 mg total) by mouth every 6 (six) hours as needed for mild pain (or Fever >/= 101). 20 tablet 0   allopurinol (ZYLOPRIM) 300 MG tablet Take 1 tablet (300 mg total) by mouth daily. 90 tablet 3   amLODipine (NORVASC) 10 MG tablet Take 1 tablet (10 mg total)  by mouth daily. 90 tablet 3   apixaban (ELIQUIS) 5 MG TABS tablet Take 1 tablet (5 mg total) by mouth 2 (two) times daily. 180 tablet 1   Calcium Carb-Cholecalciferol (CALCIUM 600 + D PO) Take 1 tablet by mouth daily. 800 units of vitamin D     docusate sodium (COLACE) 100 MG capsule Take 1 capsule (100 mg total) by mouth 2 (two) times daily. 180 capsule 1   esomeprazole (NEXIUM) 40 MG capsule Take 1 capsule (40 mg total) by mouth 2 (two) times daily before a meal. 60 capsule 12   Eszopiclone 3 MG TABS Take 1 tablet (3 mg total) by mouth at bedtime. Take immediately before bedtime 30 tablet 2   guaiFENesin (MUCINEX) 600 MG 12 hr tablet Take 600 mg by mouth 2 (two) times daily as needed for cough.     hydrOXYzine (ATARAX) 50 MG tablet Take 1 tablet (50 mg total) by mouth 3 (three) times daily as needed. 270 tablet 1   ipratropium (ATROVENT) 0.03 % nasal spray Place 2 sprays into both nostrils every 12 (twelve) hours. 30 mL 1   lidocaine-prilocaine (EMLA) cream Apply 1 Application topically as needed. 30 g 0   Multiple Vitamin (MULTIVITAMIN) tablet Take 1 tablet by mouth in the morning.     ondansetron (ZOFRAN) 8 MG tablet Take 1 tablet (8 mg total) by mouth every 8 (eight) hours as needed. 20 tablet  0   oxybutynin (DITROPAN) 5 MG tablet Take 2.5 mg by mouth 2 (two) times daily.     polyethylene glycol powder (GLYCOLAX/MIRALAX) 17 GM/SCOOP powder Take 17g (1 capful) by mouth as directed daily as needed for mild constipation. 238 g 0   potassium chloride SA (KLOR-CON M) 20 MEQ tablet Take 1 tablet (20 mEq total) by mouth daily. 30 tablet 0   prochlorperazine (COMPAZINE) 10 MG tablet Take 1 tablet (10 mg total) by mouth every 6 (six) hours as needed for nausea or vomiting 30 tablet 1   Respiratory Therapy Supplies (FLUTTER) DEVI 1 each by Does not apply route in the morning and at bedtime. 1 each 0   sodium chloride HYPERTONIC 3 % nebulizer solution Take by nebulization as needed for other. (Patient taking differently: Take 4 mLs by nebulization as needed for other.) 750 mL 1   sucralfate (CARAFATE) 1 GM/10ML suspension Take 10 mLs (1 g total) by mouth 2 (two) times daily. 420 mL 2   tadalafil (CIALIS) 20 MG tablet Take 0.5-1 tablets (10-20 mg total) by mouth daily as needed 1-2 hours before planned activity. 20 tablet 11   tamsulosin (FLOMAX) 0.4 MG CAPS capsule Take 0.4 mg by mouth daily.     valsartan-hydrochlorothiazide (DIOVAN-HCT) 320-25 MG tablet Take 1 tablet by mouth daily. 90 tablet 3   Vibegron (GEMTESA) 75 MG TABS Take 1 tablet (75 mg total) by mouth daily. 90 tablet 3   No current facility-administered medications on file prior to visit.    BP 110/60   Pulse 67   Temp 98.3 F (36.8 C) (Oral)   Ht 5\' 8"  (1.727 m)   Wt 220 lb (99.8 kg)   SpO2 99%   BMI 33.45 kg/m       Objective:   Physical Exam Vitals and nursing note reviewed.  Constitutional:      Appearance: Normal appearance.  Cardiovascular:     Rate and Rhythm: Normal rate and regular rhythm.     Pulses: Normal pulses.     Heart sounds: Normal heart sounds.  Comments: No calf pain or tenderness  Musculoskeletal:        General: Normal range of motion.     Right lower leg: 3+ Pitting Edema present.      Left lower leg: 3+ Edema present.  Skin:    General: Skin is warm and dry.  Neurological:     General: No focal deficit present.     Mental Status: He is alert and oriented to person, place, and time.  Psychiatric:        Mood and Affect: Mood normal.        Behavior: Behavior normal.        Thought Content: Thought content normal.        Judgment: Judgment normal.       Assessment & Plan:  1. Lower extremity edema - He is quite edematous. Will switch Lasix to Torsemide. Continue to elevate. Use Compression socks.   - torsemide (DEMADEX) 20 MG tablet; Take 1 tablet (20 mg total) by mouth daily.  Dispense: 30 tablet; Refill: 2   [name], NP  Time spent with patient today was 30 minutes which consisted of chart review, discussing lower extremity edema, work up, treatment answering questions and documentation.

## 2023-03-10 ENCOUNTER — Other Ambulatory Visit (HOSPITAL_BASED_OUTPATIENT_CLINIC_OR_DEPARTMENT_OTHER): Payer: Self-pay

## 2023-03-10 ENCOUNTER — Emergency Department (HOSPITAL_COMMUNITY): Payer: No Typology Code available for payment source

## 2023-03-10 ENCOUNTER — Encounter (HOSPITAL_COMMUNITY): Payer: Self-pay | Admitting: Emergency Medicine

## 2023-03-10 ENCOUNTER — Other Ambulatory Visit: Payer: Self-pay

## 2023-03-10 ENCOUNTER — Emergency Department (HOSPITAL_COMMUNITY)
Admission: EM | Admit: 2023-03-10 | Discharge: 2023-03-10 | Disposition: A | Discharge status: Home / Self Care | Payer: No Typology Code available for payment source | Attending: Emergency Medicine | Admitting: Emergency Medicine

## 2023-03-10 DIAGNOSIS — R2231 Localized swelling, mass and lump, right upper limb: Secondary | ICD-10-CM | POA: Diagnosis not present

## 2023-03-10 DIAGNOSIS — C259 Malignant neoplasm of pancreas, unspecified: Secondary | ICD-10-CM | POA: Diagnosis not present

## 2023-03-10 DIAGNOSIS — M79641 Pain in right hand: Secondary | ICD-10-CM | POA: Diagnosis present

## 2023-03-10 DIAGNOSIS — J449 Chronic obstructive pulmonary disease, unspecified: Secondary | ICD-10-CM | POA: Insufficient documentation

## 2023-03-10 DIAGNOSIS — M7989 Other specified soft tissue disorders: Secondary | ICD-10-CM

## 2023-03-10 DIAGNOSIS — Z7901 Long term (current) use of anticoagulants: Secondary | ICD-10-CM | POA: Diagnosis not present

## 2023-03-10 LAB — CBC WITH DIFFERENTIAL/PLATELET
Abs Immature Granulocytes: 0.03 10*3/uL (ref 0.00–0.07)
Basophils Absolute: 0 10*3/uL (ref 0.0–0.1)
Basophils Relative: 1 %
Eosinophils Absolute: 0.1 10*3/uL (ref 0.0–0.5)
Eosinophils Relative: 2 %
HCT: 24.4 % — ABNORMAL LOW (ref 39.0–52.0)
Hemoglobin: 7.7 g/dL — ABNORMAL LOW (ref 13.0–17.0)
Immature Granulocytes: 1 %
Lymphocytes Relative: 42 %
Lymphs Abs: 1 10*3/uL (ref 0.7–4.0)
MCH: 27.5 pg (ref 26.0–34.0)
MCHC: 31.6 g/dL (ref 30.0–36.0)
MCV: 87.1 fL (ref 80.0–100.0)
Monocytes Absolute: 0.3 10*3/uL (ref 0.1–1.0)
Monocytes Relative: 14 %
Neutro Abs: 1 10*3/uL — ABNORMAL LOW (ref 1.7–7.7)
Neutrophils Relative %: 40 %
Platelets: 226 10*3/uL (ref 150–400)
RBC: 2.8 MIL/uL — ABNORMAL LOW (ref 4.22–5.81)
RDW: 15.9 % — ABNORMAL HIGH (ref 11.5–15.5)
WBC: 2.4 10*3/uL — ABNORMAL LOW (ref 4.0–10.5)
nRBC: 0 % (ref 0.0–0.2)

## 2023-03-10 LAB — COMPREHENSIVE METABOLIC PANEL
ALT: 19 U/L (ref 0–44)
AST: 20 U/L (ref 15–41)
Albumin: 2.5 g/dL — ABNORMAL LOW (ref 3.5–5.0)
Alkaline Phosphatase: 77 U/L (ref 38–126)
Anion gap: 10 (ref 5–15)
BUN: 18 mg/dL (ref 8–23)
CO2: 26 mmol/L (ref 22–32)
Calcium: 8.7 mg/dL — ABNORMAL LOW (ref 8.9–10.3)
Chloride: 105 mmol/L (ref 98–111)
Creatinine, Ser: 1.18 mg/dL (ref 0.61–1.24)
GFR, Estimated: >60 mL/min (ref >60)
Glucose, Bld: 118 mg/dL — ABNORMAL HIGH (ref 70–99)
Potassium: 3.6 mmol/L (ref 3.5–5.1)
Sodium: 141 mmol/L (ref 135–145)
Total Bilirubin: 0.4 mg/dL (ref 0.3–1.2)
Total Protein: 6 g/dL — ABNORMAL LOW (ref 6.5–8.1)

## 2023-03-10 LAB — URIC ACID: Uric Acid, Serum: 4.7 mg/dL (ref 3.7–8.6)

## 2023-03-10 MED ORDER — DOXYCYCLINE HYCLATE 100 MG PO CAPS
100.0000 mg | ORAL_CAPSULE | Freq: Two times a day (BID) | ORAL | 0 refills | Status: AC
  Filled 2023-03-10: qty 14, 7d supply, fill #0

## 2023-03-10 MED ORDER — KETOROLAC TROMETHAMINE 30 MG/ML IJ SOLN
30.0000 mg | Freq: Once | INTRAMUSCULAR | Status: AC
  Administered 2023-03-10: 30 mg via INTRAMUSCULAR
  Filled 2023-03-10: qty 1

## 2023-03-10 MED ORDER — DOXYCYCLINE HYCLATE 100 MG PO TABS
100.0000 mg | ORAL_TABLET | Freq: Once | ORAL | Status: AC
  Administered 2023-03-10: 100 mg via ORAL
  Filled 2023-03-10: qty 1

## 2023-03-10 NOTE — Discharge Instructions (Addendum)
Please return tomorrow for further ultrasound of your right upper extremity to rule out a blood clot.  This is already been ordered for you.   You have been prescribed doxycyline. Take this antibiotic 2 times a day for the next 7 days. Take the full course of your antibiotic even if you start feeling better. Antibiotics may cause you to have diarrhea.  You may have a gout flare.   Please take 500mg  naproxen (Aleve) every 12 hours as needed for pain. Typically, pain should start to resolve within about 7-10 days. Discontinue taking this medication when your pain has resolved.  Maintaining a healthy weight and healthy blood pressure can help reduce the risk of flares. Limit consumption of alcohol, sweetened beverages (like sodas and sweet tea), red meats, organ meats (liver), and shellfish. Consumption of these foods can trigger flares.   If you have frequent flares, please schedule an appointment to talk to your PCP. They will be able to recommend further strategies for prevention of gout flare ups, which may include preventative medications.  Follow-up with your PCP in 1 week for symptom recheck.  Return to the ER if your symptoms do not start to improve within the next 48 hours, you develop fever or chills, any other new or concerning symptoms

## 2023-03-10 NOTE — ED Provider Notes (Signed)
[place] EMERGENCY DEPARTMENT AT [facility] Provider Note   CSN: [id number] Arrival date & time: 03/10/23  1709     History  Chief Complaint  Patient presents with   Cellulitis    Marcus [name] is a 82 y.o. male currently [facility], gout, COPD, afib on eliquis, presents with concern for right hand pain and swelling that began this morning.  Denies any injuries to the hand, no fever or chills. He was evaluated at the [facility] and they told him to come for further evaluation for concern of infection.   HPI     Home Medications Prior to Admission medications   Medication Sig Start Date End Date Taking? Authorizing Provider  doxycycline (VIBRAMYCIN) 100 MG capsule Take 1 capsule (100 mg total) by mouth 2 (two) times daily for 7 days. 03/10/23 03/17/23 Yes Arabella Merles, PA-C  acetaminophen (TYLENOL) 325 MG tablet Take 2 tablets (650 mg total) by mouth every 6 (six) hours as needed for mild pain (or Fever >/= 101). 01/13/23   Sheikh, Omair Latif, DO  allopurinol (ZYLOPRIM) 300 MG tablet Take 1 tablet (300 mg total) by mouth daily. 05/25/22   Nafziger, Kandee Keen, NP  amLODipine (NORVASC) 10 MG tablet Take 1 tablet (10 mg total) by mouth daily. 05/25/22   Nafziger, Kandee Keen, NP  apixaban (ELIQUIS) 5 MG TABS tablet Take 1 tablet (5 mg total) by mouth 2 (two) times daily. 02/23/23 05/24/23  Nafziger, Kandee Keen, NP  Calcium Carb-Cholecalciferol (CALCIUM 600 + D PO) Take 1 tablet by mouth daily. 800 units of vitamin D    [provider]  docusate sodium (COLACE) 100 MG capsule Take 1 capsule (100 mg total) by mouth 2 (two) times daily. 02/15/23 05/16/23  Nafziger, Kandee Keen, NP  esomeprazole (NEXIUM) 40 MG capsule Take 1 capsule (40 mg total) by mouth 2 (two) times daily before a meal. 12/24/22     Eszopiclone 3 MG TABS Take 1 tablet (3 mg total) by mouth at bedtime. Take immediately before bedtime 02/23/23   Nafziger, Kandee Keen, NP  guaiFENesin (MUCINEX) 600  MG 12 hr tablet Take 600 mg by mouth 2 (two) times daily as needed for cough.    [provider]  hydrOXYzine (ATARAX) 50 MG tablet Take 1 tablet (50 mg total) by mouth 3 (three) times daily as needed. 12/23/22 03/23/23  Nafziger, Kandee Keen, NP  ipratropium (ATROVENT) 0.03 % nasal spray Place 2 sprays into both nostrils every 12 (twelve) hours. 05/03/22     lidocaine-prilocaine (EMLA) cream Apply 1 Application topically as needed. 12/31/22   [name], MD  Multiple Vitamin (MULTIVITAMIN) tablet Take 1 tablet by mouth in the morning.    [provider]  ondansetron (ZOFRAN) 8 MG tablet Take 1 tablet (8 mg total) by mouth every 8 (eight) hours as needed. 12/31/22     oxybutynin (DITROPAN) 5 MG tablet Take 2.5 mg by mouth 2 (two) times daily.    [provider]  polyethylene glycol powder (GLYCOLAX/MIRALAX) 17 GM/SCOOP powder Take 17g (1 capful) by mouth as directed daily as needed for mild constipation. 12/15/22   Rolly Salter, MD  potassium chloride SA (KLOR-CON M) 20 MEQ tablet Take 1 tablet (20 mEq total) by mouth daily. 01/13/23   Marguerita Merles Latif, DO  prochlorperazine (COMPAZINE) 10 MG tablet Take 1 tablet (10 mg total) by mouth every 6 (six) hours as needed for nausea or vomiting 12/31/22     Respiratory Therapy Supplies (FLUTTER) DEVI  1 each by Does not apply route in the morning and at bedtime. 10/11/19   Karie Fetch P, DO  sodium chloride HYPERTONIC 3 % nebulizer solution Take by nebulization as needed for other. Patient taking differently: Take 4 mLs by nebulization as needed for other. 04/01/22   [name], NP  sucralfate (CARAFATE) 1 GM/10ML suspension Take 10 mLs (1 g total) by mouth 2 (two) times daily. 12/23/22 12/23/23  Mansouraty, Netty Starring., MD  tadalafil (CIALIS) 20 MG tablet Take 0.5-1 tablets (10-20 mg total) by mouth daily as needed 1-2 hours before planned activity. 10/04/22     tamsulosin (FLOMAX) 0.4 MG CAPS capsule Take 0.4 mg by mouth daily.  03/04/16   [provider]  torsemide (DEMADEX) 20 MG tablet Take 1 tablet (20 mg total) by mouth daily. 03/09/23   Nafziger, Kandee Keen, NP  valsartan-hydrochlorothiazide (DIOVAN-HCT) 320-25 MG tablet Take 1 tablet by mouth daily. 11/10/22   Nafziger, Kandee Keen, NP  Vibegron (GEMTESA) 75 MG TABS Take 1 tablet (75 mg total) by mouth daily. 11/24/22         Allergies    Patient has no known allergies.    Review of Systems   Review of Systems  Musculoskeletal:        Right hand pain and swelling    Physical Exam Updated Vital Signs BP (!) 156/70   Pulse 60   Temp 98 F (36.7 C) (Oral)   Resp 18   SpO2 100%  Physical Exam Vitals and nursing note reviewed.  Constitutional:      Appearance: Normal appearance.  HENT:     Head: Atraumatic.  Cardiovascular:     Comments: Radial pulse 2+ bilaterally Pulmonary:     Effort: Pulmonary effort is normal.  Musculoskeletal:     Comments: Right hand with diffuse edema over the dorsum of his hand, no erythema or obvious wounds Able to flex and extend fingers, but with some pain    Neurological:     General: No focal deficit present.     Mental Status: He is alert.  Psychiatric:        Mood and Affect: Mood normal.        Behavior: Behavior normal.     ED Results / Procedures / Treatments   Labs (all labs ordered are listed, but only abnormal results are displayed) Labs Reviewed  CBC WITH DIFFERENTIAL/PLATELET - Abnormal; Notable for the following components:      Result Value   WBC 2.4 (*)    RBC 2.80 (*)    Hemoglobin 7.7 (*)    HCT 24.4 (*)    RDW 15.9 (*)    Neutro Abs 1.0 (*)    All other components within normal limits  COMPREHENSIVE METABOLIC PANEL - Abnormal; Notable for the following components:   Glucose, Bld 118 (*)    Calcium 8.7 (*)    Total Protein 6.0 (*)    Albumin 2.5 (*)    All other components within normal limits  URIC ACID    EKG None  Radiology DG Hand Complete Right  Result Date:  03/10/2023 CLINICAL DATA:  Swelling and pain EXAM: RIGHT HAND - COMPLETE 3+ VIEW COMPARISON:  None Available. FINDINGS: No acute fracture or malalignment. No soft tissue emphysema or foreign body. No osseous destructive change. Moderate joint space narrowing and arthritis at the first [facility] joint, first IP joint and second and third D IP joints. IMPRESSION: No acute osseous abnormality. Degenerative changes. Electronically Signed   By: Selena Batten  Jake Samples M.D.   On: 03/10/2023 20:09    Procedures Procedures    Medications Ordered in ED Medications  ketorolac (TORADOL) 30 MG/ML injection 30 mg (30 mg Intramuscular Given 03/10/23 1949)  doxycycline (VIBRA-TABS) tablet 100 mg (100 mg Oral Given 03/10/23 2020)    ED Course/ Medical Decision Making/ A&P                                 Medical Decision Making Amount and/or Complexity of Data Reviewed Labs: ordered. Radiology: ordered.  Risk Prescription drug management.   82 y.o. male with pertinent past medical history of currently undergoing [facility], gout, COPD, afib on eliquis, presents with concern for right hand pain and swelling presents to the ED for concern of right hand pain and swelling that began this morning  Differential diagnosis includes but is not limited to cellulitis, osteomyelitis, upper extremity thrombus, gout  ED Course:  Patient denies any history of trauma to that hand and notes sudden onset of swelling and pain in his right hand that started this morning.  On exam, he does have significant edema of the hand without erythema.  No obvious wounds, less concern for cellulitis or osteomyelitis at this time.  Given sudden onset, could consider upper extremity thrombus.  I feel like this is less likely given he is on Eliquis for A-fib, will get upper extremity ultrasound to rule out.  Suspect this may be due to gout due to sudden onset and no leukocytosis based on [name] labs earlier today which showed  WBC 3.1, no fever or chills, no obvious wound.  He has a history of gout and has been treated with allopurinol. Will treat pain currently with IM ketorolac.  Will plan for repeat CBC and CMP, uric acid CBC with no leukocytosis, CMP unremarkable, uric acid within normal limits.  Given uric acid can also be normal during gout flares, suspect this may be gout due to sudden onset swelling and pain, and history of gout.  Ultrasound in the right upper extremity joint compartments was accidentally ordered in place of the ultrasound venous right upper extremity.  Will have patient return tomorrow for right upper extremity venous ultrasound to rule out blood clot.  X-ray right hand with no signs of osteomyelitis.  However, will also start patient on treatment for possible infection given his immunocompromise status.  He was given 100 mg doxycycline in the ER.  Prescription has been sent over for 7-day course of doxycycline to his pharmacy.  He understands to start this medication tomorrow morning as prescribed.  Impression: Right hand swelling  Disposition:  The patient was discharged home with instructions to return tomorrow for right upper extremity venous ultrasound, take 7-day course of doxycycline as prescribed, naproxen for pain.  Follow-up with PCP in 1 week for recheck Return precautions given.  Lab Tests: I Ordered, and personally interpreted labs.  The pertinent results include:   CBC with no leukocytosis, there is neutropenia likely given current chemotherapy regimen Uric acid within normal limits  Imaging Studies ordered: I ordered imaging studies including x ray right hand   I independently visualized the imaging with scope of interpretation limited to determining acute life threatening conditions related to emergency care. Imaging showed no signs of osteomyelitis I agree with the radiologist interpretation  Co morbidities that complicate the patient evaluation  currently undergoing  [facility],  gout, COPD, afib on eliquis, presents with concern for right hand pain and swelling              Final Clinical Impression(s) / ED Diagnoses Final diagnoses:  Swelling of right hand    Rx / DC Orders ED Discharge Orders          Ordered    UE VENOUS DUPLEX        03/10/23 2009    doxycycline (VIBRAMYCIN) 100 MG capsule  2 times daily        03/10/23 2010              Arabella Merles, PA-C 03/10/23 2039    Anders Simmonds T, DO 03/10/23 2328

## 2023-03-10 NOTE — ED Triage Notes (Signed)
Pt sent by [facility] in [place] for possible admission for cellulitis. Pt has swollen and red right hand.  Was told his labs this morning were indicative of an infection.  Pt is currently undergoing treatment for pancreatic cancer.

## 2023-03-11 ENCOUNTER — Encounter: Payer: Self-pay | Admitting: Oncology

## 2023-03-11 ENCOUNTER — Ambulatory Visit (HOSPITAL_COMMUNITY)
Admission: RE | Admit: 2023-03-11 | Discharge: 2023-03-11 | Disposition: A | Discharge status: Home / Self Care | Payer: Medicare Other | Source: Ambulatory Visit | Attending: Emergency Medicine | Admitting: Emergency Medicine

## 2023-03-11 ENCOUNTER — Other Ambulatory Visit (HOSPITAL_BASED_OUTPATIENT_CLINIC_OR_DEPARTMENT_OTHER): Payer: Self-pay

## 2023-03-11 DIAGNOSIS — M7989 Other specified soft tissue disorders: Secondary | ICD-10-CM | POA: Diagnosis not present

## 2023-03-14 ENCOUNTER — Telehealth: Payer: Self-pay

## 2023-03-14 NOTE — Telephone Encounter (Signed)
Transition Care Management Follow-up Telephone Call Date of discharge and from where: [facility] 8/26 How have you been since you were released from the hospital? Getting better and taking medication and has followed up with PCP Any questions or concerns? No  Items Reviewed: Did the pt receive and understand the discharge instructions provided? Yes  Medications obtained and verified? Yes  Other? No  Any new allergies since your discharge? No  Dietary orders reviewed? No Do you have support at home? Yes    Follow up appointments reviewed:  PCP Hospital f/u appt confirmed? Yes  Scheduled to see PCP on  @ . Specialist Hospital f/u appt confirmed? No  Scheduled to see  on  @ . Are transportation arrangements needed? No  If their condition worsens, is the pt aware to call PCP or go to the Emergency Dept.? Yes Was the patient provided with contact information for the PCP's office or ED? Yes Was to pt encouraged to call back with questions or concerns? Yes

## 2023-03-15 ENCOUNTER — Ambulatory Visit: Payer: Medicare Other | Admitting: Adult Health

## 2023-03-16 ENCOUNTER — Encounter: Payer: Self-pay | Admitting: Adult Health

## 2023-03-16 ENCOUNTER — Ambulatory Visit (INDEPENDENT_AMBULATORY_CARE_PROVIDER_SITE_OTHER): Payer: Medicare Other | Admitting: Adult Health

## 2023-03-16 VITALS — BP 128/78 | HR 66 | Ht 68.0 in | Wt 215.0 lb

## 2023-03-16 DIAGNOSIS — G4733 Obstructive sleep apnea (adult) (pediatric): Secondary | ICD-10-CM | POA: Diagnosis not present

## 2023-03-16 DIAGNOSIS — J479 Bronchiectasis, uncomplicated: Secondary | ICD-10-CM | POA: Diagnosis not present

## 2023-03-16 DIAGNOSIS — I5042 Chronic combined systolic (congestive) and diastolic (congestive) heart failure: Secondary | ICD-10-CM | POA: Diagnosis not present

## 2023-03-16 DIAGNOSIS — J189 Pneumonia, unspecified organism: Secondary | ICD-10-CM

## 2023-03-16 NOTE — Assessment & Plan Note (Signed)
Appears euvolemic on exam today.  Continue on current regimen.  Continue follow-up with cardiology

## 2023-03-16 NOTE — Assessment & Plan Note (Signed)
Mild bronchiectasis.-Encouraged on mucociliary clearance with flutter valve and saline nebs.

## 2023-03-16 NOTE — Assessment & Plan Note (Signed)
Severe obstructive sleep apnea.  Patient is on nocturnal CPAP.  Has perceived benefit.  Repeat sleep study shows ongoing severe sleep apnea.  Patient needs a new CPAP machine.  Order was placed.  Use auto CPAP 5 to 15 cm H2O.  Plan  Patient Instructions  Saline nebs as needed for cough /congestion  Flutter valve Twice daily  As needed  cough/congestion   Order for new CPAP  Wear CPAP At bedtime  , all night long for at least 6hr or more  Follow up with Dr. Vassie Loll or Jenicka Coxe NP in 3 months and As needed

## 2023-03-16 NOTE — Assessment & Plan Note (Signed)
Clinically improved follow-up chest x-ray February 27, 2023 showed significant improvement with improved aeration. No further treatment at this time is indicated.  Continue with mucociliary clearance.

## 2023-03-16 NOTE — Patient Instructions (Signed)
Saline nebs as needed for cough /congestion  Flutter valve Twice daily  As needed  cough/congestion   Order for new CPAP  Wear CPAP At bedtime  , all night long for at least 6hr or more  Follow up with Dr. Vassie Loll or Yeraldine Forney NP in 3 months and As needed

## 2023-03-16 NOTE — Progress Notes (Signed)
@Patient  ID: Marcus [name], male    DOB: Oct 22, 1940, 82 y.o.   MRN: [id number]  Chief Complaint  Patient presents with   Follow-up    Review HST 02/10/23- ordered by [facility].     Referring provider: [name], NP  HPI: 82 year old male followed for Mild Bronchiectasis with chronic cough and OSA  Medical history significant for obesity status post bariatric surgery, newly diagnosed pancreatic cancer 2024, A-fib on Eliquis Medical care through [name]    TEST/EVENTS :  PFTs 08/2020 nml   10/30/2019 BAL cell count with differential: 185 cells, 2% PMNs, 80% monocytes, 15% lymphocytes   Micro: 10/30/19 BAL culture-normal flora 10/30/19 BAL fungus-KOH smear negative 10/30/2019 BAL AFB- negative     HRCT chest 09/27/19- Scattered groundglass opacities and tree-in-bud opacities.  Bilateral basilar linear scars.  Mild bronchiectasis bilateral lower lobes, right middle lobe, right upper lobe, lingula.  Minimal air-trapping.   Likely small hiatal hernia.    03/2022 CT sinuses >> minimal mucosal thickening  Oncology Notes :  CT abdomen/pelvis 12/03/2022-suspected pancreas mass in the proximal body measuring 2 cm with probable mass effect on the superior aspect of the SMV, multiple hypodense liver lesions, many are new from a remote CT-indeterminate, 2 cystic lesions in the pancreas tail-nonspecific prior bariatric surgery ERCP 12/13/2022-malignant appearing common bile duct stricture, plastic stent placed, brushing cytology-no malignant cells EUS 12/23/2022-30 x 33 mm pancreas head mass with invasion of the SMV, no malignant appearing lymph nodes, 4 cysts in the visualized liver, FNA-adenocarcinoma Cycle 1 gemcitabine and abraxane 01/07/2023 CTs 01/09/2023-multiple low-attenuation liver lesions unchanged, largest of which are clearly benign fluid attenuation cyst, others more ill-defined and incompletely characterized suspicious for small metastases.  Similar appearance of hypodense mass in the pancreatic  head.  Distended gallbladder with mild gallbladder wall thickening and adjacent pericholecystic fat stranding.  Sludge in the gallbladder fundus.  Common bile duct stent in place with diminished, persistent intrahepatic biliary ductal dilatation.  Negative for pulmonary embolism.  Acute appearing heterogeneous airspace opacity of the superior segment left lower lobe consistent with infection or aspiration. Cycle 2 gemcitabine and abraxane 01/21/2023, gemcitabine dose reduced due to neutropenia following cycle 1 Cycle 3 gemcitabine/Abraxane 02/04/2023 Cycle 4 gemcitabine/Abraxane 02/17/2023 Cycle 5 gemcitabine/Abraxane 03/04/2023 Biliary obstruction secondary to 1-status post ERCP/stent placement 12/13/2022  03/16/2023 Follow up: OSA , Bronchiectasis , Pneumonia  Patient returns for a 15-month follow-up.  Patient has history of severe sleep apnea.  Has been on CPAP for long time.  Says he uses it every night..  His CPAP machine has gotten old and is not working properly.  Patient recently had a follow-up sleep study that was done on January 20, 2023 that showed severe sleep apnea with AHI at 43/hour and SpO2 low at 75%.  Supine position AHI 51.6/hour.  Patient says he needs a new CPAP machine.  Feels that he benefits from CPAP with decreased daytime sleepiness.  Newly diagnosed Pancreatic cancer undergoing active therapy. See oncology notes above. Going for second opinion at [facility]. Has been under a lot of stress.  Has had multiple complications including chronic biliary ductal obstruction status post stent placement due to pancreatic mass.  He was admitted in July with sepsis with pneumonia and acute cholecystitis .  He was treated with IV antibiotics.  Follow-up chest x-ray February 27, 2023 showed improved aeration. Says last few days is starting to do some better.  Denies any increased cough or congestion. Does have saline nebs and flutter valve at home that  he uses on occasion.    No Known  Allergies  Immunization History  Administered Date(s) Administered   Hepatitis A, Adult 03/02/2011   Influenza Whole 06/05/2007, 04/30/2008, 03/31/2009   Influenza, High Dose Seasonal PF 06/19/2014, 07/30/2015, 03/03/2017   Influenza,inj,quad, With Preservative 04/04/2017   Moderna Sars-Covid-2 Vaccination 07/03/2020   PFIZER(Purple Top)SARS-COV-2 Vaccination 08/13/2019, 09/11/2019   Pneumococcal Conjugate-13 06/19/2014, 07/30/2015   Pneumococcal Polysaccharide-23 12/11/2003, 03/03/2017   Td 03/31/2009   Tdap 10/19/2019, 03/24/2021   Typhoid Inactivated 03/02/2011   Zoster Recombinant(Shingrix) 07/17/2020, 09/15/2020    Past Medical History:  Diagnosis Date   Acute meniscal tear of knee left   Arthritis    generalized   AV block 08/01/2018   Benign essential hypertension 01/18/2007   Bronchiectasis with acute exacerbation 06/23/2020   Chest pain, atypical 08/23/2014   Chronic cough 10/07/2014   Coronary artery disease    Degeneration of lumbar intervertebral disc 10/14/2017   Diverticulosis of colon 07/07/2005   Elevated PSA 06/19/2014   GERD (gastroesophageal reflux disease) 01/18/2007   Gout, unspecified 01/18/2007   Hemorrhoids 01/19/2011   Hiatal hernia    History of colonic polyps 01/18/2007   Insomnia, unspecified 08/21/2007   Iron deficiency anemia, unspecified  01/19/2011   Lumbar post-laminectomy syndrome 10/14/2017   Lumbar spondylolysis    Lumbar strain 12/14/2011   Obstructive sleep apnea 01/18/2007   uses CPAP nightly   Paraesophageal hernia    large   Primary osteoarthritis of knee 05/23/2015   Prostate cancer (HCC) 2016   S/P knee replacement 05/23/2015   Sensorineural hearing loss, bilateral    Vitamin B12 deficiency    Vitamin D deficiency 10/28/2009    Tobacco History: Social History   Tobacco Use  Smoking Status Former   Current packs/day: 0.00   Average packs/day: 2.0 packs/day for 20.0 years (40.0 ttl pk-yrs)   Types: Cigarettes    Start date: 07/05/1954   Quit date: 07/05/1974   Years since quitting: 48.7  Smokeless Tobacco Never   Counseling given: Not Answered   Outpatient Medications Prior to Visit  Medication Sig Dispense Refill   acetaminophen (TYLENOL) 325 MG tablet Take 2 tablets (650 mg total) by mouth every 6 (six) hours as needed for mild pain (or Fever >/= 101). 20 tablet 0   allopurinol (ZYLOPRIM) 300 MG tablet Take 1 tablet (300 mg total) by mouth daily. 90 tablet 3   amLODipine (NORVASC) 10 MG tablet Take 1 tablet (10 mg total) by mouth daily. 90 tablet 3   apixaban (ELIQUIS) 5 MG TABS tablet Take 1 tablet (5 mg total) by mouth 2 (two) times daily. 180 tablet 1   Calcium Carb-Cholecalciferol (CALCIUM 600 + D PO) Take 1 tablet by mouth daily. 800 units of vitamin D     docusate sodium (COLACE) 100 MG capsule Take 1 capsule (100 mg total) by mouth 2 (two) times daily. 180 capsule 1   doxycycline (VIBRAMYCIN) 100 MG capsule Take 1 capsule (100 mg total) by mouth 2 (two) times daily for 7 days. 14 capsule 0   esomeprazole (NEXIUM) 40 MG capsule Take 1 capsule (40 mg total) by mouth 2 (two) times daily before a meal. 60 capsule 12   Eszopiclone 3 MG TABS Take 1 tablet (3 mg total) by mouth at bedtime. Take immediately before bedtime 30 tablet 2   guaiFENesin (MUCINEX) 600 MG 12 hr tablet Take 600 mg by mouth 2 (two) times daily as needed for cough.     hydrOXYzine (ATARAX) 50 MG tablet  Take 1 tablet (50 mg total) by mouth 3 (three) times daily as needed. 270 tablet 1   ipratropium (ATROVENT) 0.03 % nasal spray Place 2 sprays into both nostrils every 12 (twelve) hours. 30 mL 1   lidocaine-prilocaine (EMLA) cream Apply 1 Application topically as needed. 30 g 0   Multiple Vitamin (MULTIVITAMIN) tablet Take 1 tablet by mouth in the morning.     ondansetron (ZOFRAN) 8 MG tablet Take 1 tablet (8 mg total) by mouth every 8 (eight) hours as needed. 20 tablet 0   oxybutynin (DITROPAN) 5 MG tablet Take 2.5 mg by mouth 2  (two) times daily.     polyethylene glycol powder (GLYCOLAX/MIRALAX) 17 GM/SCOOP powder Take 17g (1 capful) by mouth as directed daily as needed for mild constipation. 238 g 0   potassium chloride SA (KLOR-CON M) 20 MEQ tablet Take 1 tablet (20 mEq total) by mouth daily. 30 tablet 0   prochlorperazine (COMPAZINE) 10 MG tablet Take 1 tablet (10 mg total) by mouth every 6 (six) hours as needed for nausea or vomiting 30 tablet 1   Respiratory Therapy Supplies (FLUTTER) DEVI 1 each by Does not apply route in the morning and at bedtime. 1 each 0   sodium chloride HYPERTONIC 3 % nebulizer solution Take by nebulization as needed for other. (Patient taking differently: Take 4 mLs by nebulization as needed for other.) 750 mL 1   sucralfate (CARAFATE) 1 GM/10ML suspension Take 10 mLs (1 g total) by mouth 2 (two) times daily. 420 mL 2   tadalafil (CIALIS) 20 MG tablet Take 0.5-1 tablets (10-20 mg total) by mouth daily as needed 1-2 hours before planned activity. 20 tablet 11   tamsulosin (FLOMAX) 0.4 MG CAPS capsule Take 0.4 mg by mouth daily.     torsemide (DEMADEX) 20 MG tablet Take 1 tablet (20 mg total) by mouth daily. 30 tablet 2   valsartan-hydrochlorothiazide (DIOVAN-HCT) 320-25 MG tablet Take 1 tablet by mouth daily. 90 tablet 3   Vibegron (GEMTESA) 75 MG TABS Take 1 tablet (75 mg total) by mouth daily. 90 tablet 3   No facility-administered medications prior to visit.     Review of Systems:   Constitutional:   No  weight loss, night sweats,  Fevers, chills, +fatigue, or  lassitude.  HEENT:   No headaches,  Difficulty swallowing,  Tooth/dental problems, or  Sore throat,                No sneezing, itching, ear ache, nasal congestion, post nasal drip,   CV:  No chest pain,  Orthopnea, PND, anasarca, dizziness, palpitations, syncope.   GI  No heartburn, indigestion, abdominal pain, nausea, vomiting, diarrhea, change in bowel habits, loss of appetite, bloody stools.   Resp:Skin: no rash or  lesions.  GU: no dysuria, change in color of urine, no urgency or frequency.  No flank pain, no hematuria   MS:  No joint pain or swelling.  No decreased range of motion.  No back pain.    Physical Exam  BP 128/78 (BP Location: Left Arm, Cuff Size: Normal)   Pulse 66   Ht 5\' 8"  (1.727 m)   Wt 215 lb (97.5 kg)   SpO2 100% Comment: on RA  BMI 32.69 kg/m   GEN: A/Ox3; pleasant , NAD, well nourished    HEENT:  [place]/AT,   NOSE-clear, THROAT-clear, no lesions, no postnasal drip or exudate noted.   NECK:  Supple w/ fair ROM; no JVD; normal carotid impulses w/o bruits;  no thyromegaly or nodules palpated; no lymphadenopathy.    RESP  Clear  P & A; w/o, wheezes/ rales/ or rhonchi. no accessory muscle use, no dullness to percussion  CARD:  RRR, no m/r/g, 1+ peripheral edema, pulses intact, no cyanosis or clubbing.  GI:   Soft & nt; nml bowel sounds; no organomegaly or masses detected.   Musco: Warm bil, no deformities or joint swelling noted.   Neuro: alert, no focal deficits noted.    Skin: Warm, no lesions or rashes    Lab Results:     Imaging: UE VENOUS DUPLEX  Result Date: 03/12/2023 UPPER VENOUS STUDY  Patient Name:  Marcus [name]  Date of Exam:   03/11/2023 Medical Rec #: [id number]        Accession #:    [id number] Date of Birth: 1941-04-03        Patient Gender: M Patient Age:   72 years Exam Location:  [facility] Procedure:      VAS [place] UPPER EXTREMITY VENOUS DUPLEX Referring Phys: Arabella Merles --------------------------------------------------------------------------------  Indications: Right hand swelling, erythema Comparison Study: 02-28-2023 Prior right upper extremity venous study was                   negative for DVT/SVT. Performing Technologist: Jean Rosenthal RDMS, RVT  Examination Guidelines: A complete evaluation includes B-mode imaging, spectral Doppler, color Doppler, and power Doppler as needed of all accessible portions of each vessel. Bilateral testing  is considered an integral part of a complete examination. Limited examinations for reoccurring indications may be performed as noted.  Right Findings: +----------+------------+---------+-----------+----------+-------+ RIGHT     CompressiblePhasicitySpontaneousPropertiesSummary +----------+------------+---------+-----------+----------+-------+ IJV           Full       Yes       Yes                      +----------+------------+---------+-----------+----------+-------+ Subclavian               Yes       Yes                      +----------+------------+---------+-----------+----------+-------+ Axillary      Full       Yes       Yes                      +----------+------------+---------+-----------+----------+-------+ Brachial      Full                                          +----------+------------+---------+-----------+----------+-------+ Radial        Full                                          +----------+------------+---------+-----------+----------+-------+ Ulnar         Full                                          +----------+------------+---------+-----------+----------+-------+ Cephalic      Full                                          +----------+------------+---------+-----------+----------+-------+  Basilic       Full                                          +----------+------------+---------+-----------+----------+-------+  Left Findings: +----------+------------+---------+-----------+----------+-------+ LEFT      CompressiblePhasicitySpontaneousPropertiesSummary +----------+------------+---------+-----------+----------+-------+ Subclavian               Yes       Yes                      +----------+------------+---------+-----------+----------+-------+  Summary:  Right: No evidence of deep vein thrombosis in the upper extremity. No evidence of superficial vein thrombosis in the upper extremity.  Left: No evidence of  thrombosis in the subclavian.  *See table(s) above for measurements and observations.  Diagnosing physician: Coral Else MD Electronically signed by Coral Else MD on 03/12/2023 at 7:31:59 PM.    Final    [place] Extrem Up Right Comp  Result Date: 03/10/2023 CLINICAL DATA:  Right hand swelling. EXAM: ULTRASOUND RIGHT UPPER EXTREMITY LIMITED TECHNIQUE: Ultrasound examination of the upper extremity soft tissues was performed in the area of clinical concern. COMPARISON:  None Available. FINDINGS: The dorsum of the right hand was evaluated. Subcutaneous edema along the dorsum of the hand. No discrete solid or cystic lesion. IMPRESSION: Subcutaneous edema along the dorsum of the right hand. No discrete solid or cystic lesion. Electronically Signed   By: Richarda Overlie M.D.   On: 03/10/2023 20:39   DG Hand Complete Right  Result Date: 03/10/2023 CLINICAL DATA:  Swelling and pain EXAM: RIGHT HAND - COMPLETE 3+ VIEW COMPARISON:  None Available. FINDINGS: No acute fracture or malalignment. No soft tissue emphysema or foreign body. No osseous destructive change. Moderate joint space narrowing and arthritis at the first [facility] joint, first IP joint and second and third D IP joints. IMPRESSION: No acute osseous abnormality. Degenerative changes. Electronically Signed   By: Jasmine Pang M.D.   On: 03/10/2023 20:09   US Venous Img Upper Uni Right(DVT)  Result Date: 02/28/2023 CLINICAL DATA:  Right upper extremity edema. EXAM: RIGHT UPPER EXTREMITY VENOUS DOPPLER ULTRASOUND TECHNIQUE: Gray-scale sonography with graded compression, as well as color Doppler and duplex ultrasound were performed to evaluate the upper extremity deep venous system from the level of the subclavian vein and including the jugular, axillary, basilic, radial, ulnar and upper cephalic vein. Spectral Doppler was utilized to evaluate flow at rest and with distal augmentation maneuvers. COMPARISON:  None Available. FINDINGS: Contralateral Subclavian Vein:  Respiratory phasicity is normal and symmetric with the symptomatic side. No evidence of thrombus. Normal compressibility. Internal Jugular Vein: No evidence of thrombus. Normal compressibility, respiratory phasicity and response to augmentation. Subclavian Vein: No evidence of thrombus. Normal compressibility, respiratory phasicity and response to augmentation. Axillary Vein: No evidence of thrombus. Normal compressibility, respiratory phasicity and response to augmentation. Cephalic Vein: No evidence of thrombus. Normal compressibility, respiratory phasicity and response to augmentation. Basilic Vein: No evidence of thrombus. Normal compressibility, respiratory phasicity and response to augmentation. Brachial Veins: No evidence of thrombus. Normal compressibility, respiratory phasicity and response to augmentation. Radial Veins: No evidence of thrombus. Normal compressibility, respiratory phasicity and response to augmentation. Ulnar Veins: No evidence of thrombus. Normal compressibility, respiratory phasicity and response to augmentation. Venous Reflux:  None visualized. Other Findings: No evidence of superficial thrombophlebitis. There is a complex fluid collection localizing to the region of the right  elbow measuring roughly 3.4 x 2.2 x 2.6 cm. Etiology may relate to hemorrhage, infection or inflammatory process such as bursitis. Correlation suggested clinically. IMPRESSION: 1. No evidence of DVT within the right upper extremity. 2. Complex fluid collection localizing to the region of the right elbow measuring roughly 3.4 x 2.2 x 2.6 cm. Etiology may relate to hemorrhage, infection or inflammatory process such as bursitis. Electronically Signed   By: Irish Lack M.D.   On: 02/28/2023 11:43   DG Chest Portable 1 View  Result Date: 02/27/2023 CLINICAL DATA:  Shortness of breath. EXAM: PORTABLE CHEST 1 VIEW COMPARISON:  01/13/2023, CT 01/09/2023 FINDINGS: Right chest port and left-sided pacemaker remain in  place. Cardiomegaly is unchanged. Mediastinal contours are unchanged. Improved lung aeration with improving lung volumes. No acute airspace disease. No pulmonary edema. No large pleural effusion. IMPRESSION: 1. Improved lung aeration with improving lung volumes. 2. Unchanged cardiomegaly. Electronically Signed   By: Narda Rutherford M.D.   On: 02/27/2023 23:41   CT VENOGRAM ABD/PELVIS/LOWER EXT BILAT  Result Date: 02/23/2023 CLINICAL DATA:  Pancreatic cancer with mesenteric venous involvement now with acute lower extremity pitting edema and paresthesia. * Tracking Code: BO * EXAM: CT VENOGRAM ABDOMEN AND PELVIS AND LOWER EXTREMITY BILATERAL TECHNIQUE: Venographic phase images of the abdomen, pelvis and lower extremities were obtained following the administration of intravenous contrast. Multiplanar reformats and maximum intensity projections were generated. RADIATION DOSE REDUCTION: This exam was performed according to the departmental dose-optimization program which includes automated exposure control, adjustment of the mA and/or kV according to patient size and/or use of iterative reconstruction technique. CONTRAST:  OMNIPAQUE IOHEXOL 350 MG/ML SOLN COMPARISON:  01/09/2023 FINDINGS: Lower chest: No acute abnormality. Pacemaker lead seen within the right heart. Hepatobiliary: Interval palliative metallic stenting of the extrahepatic bile duct now with non dependent pneumobilia suggesting patency of the stented segment. Scattered tiny cysts or intrahepatic biloma spine are identified and are grossly unchanged though the dominant collection seen on prior examination within the right hepatic dome has decreased in size now measuring 17 mm in greatest dimension (previously measuring 31 mm). No enhancing intrahepatic mass. Cholelithiasis. There is progressive gallbladder wall thickening and pericholecystic edema which may reflect changes of acute cholecystitis in the appropriate clinical setting. Pancreas:  Hypoechoic mass within the proximal body of the pancreas is again identified, measuring roughly 19 x 21 mm at axial image # 27/2. There is progressive dilation of the pancreatic duct upstream from the mass with associated parenchymal atrophy. No peripancreatic inflammatory changes. As noted on prior examination, the mass abuts and markedly narrows the portosplenic confluence though these vessels remain patent. Spleen: Unremarkable Adrenals/Urinary Tract: The adrenal glands are unremarkable. The kidneys are normal in size and position. Simple cortical cyst noted within the lower pole the left kidney for which no follow-up imaging is recommended. Vascular calcification noted within the right renal hilum. No urinary renal or ureteral calculi identified. No hydronephrosis. The bladder is unremarkable. Stomach/Bowel: Surgical changes of a partial gastrectomy or gastric bypass with subsequent take down and gastro-gastrostomy are again identified. Status post sigmoid colectomy. The stomach, small bowel, and large bowel are otherwise unremarkable there is no evidence of obstruction. No free intraperitoneal gas or fluid. Vascular/Lymphatic: As noted above, the primary pancreatic mass abuts and encases the superior mesenteric vein and main portal vein at the portosplenic confluence as evidence by marked narrowing of the structure. These vessels remain patent. Moderate aortoiliac atherosclerotic calcification. No aortic aneurysm. The inferior vena cava and lower  extremity iliofemoral venous vasculature are widely patent. Delayed images demonstrate wide patency of the femoral-popliteal venous vasculature with slight limitation related to streak artifact from left total knee arthroplasty. No evidence of deep venous thrombus or focal venous occlusion to account for the patient's reported peripheral edema. Reproductive: Brachytherapy seeds are seen within the prostate gland. Other: Ventral hernia repair with mesh has been  performed. No recurrent abdominal wall hernia. Musculoskeletal: Degenerative changes are seen within the lumbar spine. No acute bone abnormality. No lytic blastic bone lesion. IVC: Patent Portal and mesenteric veins: Marked narrowing of the portosplenic confluence related to the pancreatic mass as described above. Bilateral iliac veins: Patent Right lower extremity: Patent femoropopliteal venous vasculature Left lower extremity: Patent femoropopliteal venous vasculature IMPRESSION: 1. No evidence of deep venous thrombus or focal venous occlusion to account for the patient's reported peripheral edema. 2. Redemonstration of primary pancreatic mass abutting and encasing the superior mesenteric vein and main portal vein at the portosplenic confluence as evidenced by marked narrowing of these vessels. These vessels remain patent. 3. Progressive dilation of the pancreatic duct upstream from the mass with associated parenchymal atrophy. 4. Interval palliative metallic stenting of the extrahepatic bile duct now with non dependent pneumobilia suggesting patency of the stented segment. 5. Cholelithiasis with progressive gallbladder wall thickening and pericholecystic edema which may reflect changes of acute cholecystitis in the appropriate clinical setting. Correlation with liver enzymes and possible right upper quadrant abdominal sonography is recommended. Aortic Atherosclerosis (ICD10-I70.0). Electronically Signed   By: Helyn Numbers M.D.   On: 02/23/2023 04:16   CUP PACEART REMOTE DEVICE CHECK  Result Date: 02/15/2023 Scheduled remote reviewed. Normal device function.  AF/AFL identified 4/2 in office device check, Warfarin per EPIC.  No new stored events Next remote 91 days. LA, CVRS   alteplase (CATHFLO ACTIVASE) injection 2 mg     Date Action Dose Route User   Discharged on 03/10/2023   Admitted on 03/10/2023   Discharged on 02/28/2023   Admitted on 02/27/2023   Discharged on 02/23/2023   Admitted on 02/23/2023    Discharged on 02/07/2023   Admitted on 02/07/2023   Discharged on 01/30/2023   Admitted on 01/30/2023   01/21/2023 1052 Given 2 mg Intracatheter Heller, Rico Ala, RN      gemcitabine (GEMZAR) 1,710 mg in sodium chloride 0.9 % 250 mL chemo infusion     Date Action Dose Route User   Discharged on 03/10/2023   Admitted on 03/10/2023   Discharged on 02/28/2023   Admitted on 02/27/2023   Discharged on 02/23/2023   Admitted on 02/23/2023   Discharged on 02/07/2023   Admitted on 02/07/2023   Discharged on 01/30/2023   Admitted on 01/30/2023   01/21/2023 1351 Infusion Verify (none) Intravenous Nonah Mattes, RN   01/21/2023 1351 Rate/Dose Change (none) Intravenous Nonah Mattes, RN   01/21/2023 1350 New Bag/Given 1,710 mg Intravenous Nonah Mattes, RN      gemcitabine (GEMZAR) 1,710 mg in sodium chloride 0.9 % 250 mL chemo infusion     Date Action Dose Route User   Discharged on 03/10/2023   Admitted on 03/10/2023   Discharged on 02/28/2023   Admitted on 02/27/2023   Discharged on 02/23/2023   Admitted on 02/23/2023   Discharged on 02/07/2023   Admitted on 02/07/2023   02/04/2023 1546 Infusion Verify (none) Intravenous Nonah Mattes, RN   02/04/2023 1546 Rate/Dose Change (none) Intravenous Nonah Mattes, RN   02/04/2023 1546 New Bag/Given 1,710 mg  Intravenous Heller, Rico Ala, RN      gemcitabine (GEMZAR) 1,710 mg in sodium chloride 0.9 % 250 mL chemo infusion     Date Action Dose Route User   Discharged on 03/10/2023   Admitted on 03/10/2023   Discharged on 02/28/2023   Admitted on 02/27/2023   Discharged on 02/23/2023   Admitted on 02/23/2023   02/17/2023 1139 Rate/Dose Change (none) Intravenous Nonah Mattes, RN   02/17/2023 1138 New Bag/Given 1,710 mg Intravenous Heller, Rico Ala, RN      gemcitabine (GEMZAR) 1,710 mg in sodium chloride 0.9 % 250 mL chemo infusion     Date Action Dose Route User   Discharged on 03/10/2023   Admitted on 03/10/2023   03/04/2023 1310 Infusion  Verify (none) Intravenous Delena Bali, RN   03/04/2023 1242 Rate/Dose Change (none) Intravenous Delena Bali, RN   03/04/2023 1241 New Bag/Given 1,710 mg Intravenous Delena Bali, RN      heparin lock flush 100 unit/mL     Date Action Dose Route User   Discharged on 03/10/2023   Admitted on 03/10/2023   Discharged on 02/28/2023   Admitted on 02/27/2023   Discharged on 02/23/2023   Admitted on 02/23/2023   Discharged on 02/07/2023   Admitted on 02/07/2023   Discharged on 01/30/2023   Admitted on 01/30/2023   01/17/2023 1414 Given 500 Units Intravenous Wandalee Ferdinand, RN      heparin lock flush 100 unit/mL     Date Action Dose Route User   Discharged on 03/10/2023   Admitted on 03/10/2023   Discharged on 02/28/2023   Admitted on 02/27/2023   Discharged on 02/23/2023   Admitted on 02/23/2023   Discharged on 02/07/2023   Admitted on 02/07/2023   Discharged on 01/30/2023   Admitted on 01/30/2023   01/21/2023 1427 Given 500 Units Intracatheter Heller, Rico Ala, RN      heparin lock flush 100 unit/mL     Date Action Dose Route User   Discharged on 03/10/2023   Admitted on 03/10/2023   Discharged on 02/28/2023   Admitted on 02/27/2023   Discharged on 02/23/2023   Admitted on 02/23/2023   02/17/2023 1213 Given 500 Units Intracatheter Heller, Rico Ala, RN      heparin lock flush 100 unit/mL     Date Action Dose Route User   Discharged on 03/10/2023   Admitted on 03/10/2023   03/04/2023 1318 Given 500 Units Intracatheter Young, Ricci Barker, RN      PACLitaxel-protein bound (ABRAXANE) chemo infusion 200 mg     Date Action Dose Route User   Discharged on 03/10/2023   Admitted on 03/10/2023   Discharged on 02/28/2023   Admitted on 02/27/2023   Discharged on 02/23/2023   Admitted on 02/23/2023   Discharged on 02/07/2023   Admitted on 02/07/2023   Discharged on 01/30/2023   Admitted on 01/30/2023   01/21/2023 1257 Infusion Verify (none) Intravenous Nonah Mattes, RN   01/21/2023 1257 New Bag/Given 200 mg  Intravenous Nonah Mattes, RN      PACLitaxel-protein bound (ABRAXANE) chemo infusion 200 mg     Date Action Dose Route User   Discharged on 03/10/2023   Admitted on 03/10/2023   Discharged on 02/28/2023   Admitted on 02/27/2023   Discharged on 02/23/2023   Admitted on 02/23/2023   Discharged on 02/07/2023   Admitted on 02/07/2023   02/04/2023 1500 Infusion Verify (none) Intravenous Nonah Mattes, RN   02/04/2023 1500  New Bag/Given 200 mg Intravenous Heller, Rico Ala, RN      PACLitaxel-protein bound (ABRAXANE) chemo infusion 200 mg     Date Action Dose Route User   Discharged on 03/10/2023   Admitted on 03/10/2023   Discharged on 02/28/2023   Admitted on 02/27/2023   Discharged on 02/23/2023   Admitted on 02/23/2023   02/17/2023 1048 Infusion Verify (none) Intravenous Nonah Mattes, RN   02/17/2023 1048 New Bag/Given 200 mg Intravenous Heller, Rico Ala, RN      PACLitaxel-protein bound (ABRAXANE) chemo infusion 200 mg     Date Action Dose Route User   Discharged on 03/10/2023   Admitted on 03/10/2023   03/04/2023 1207 Rate/Dose Change (none) Intravenous Delena Bali, RN   03/04/2023 1207 New Bag/Given 200 mg Intravenous Delena Bali, RN      prochlorperazine (COMPAZINE) tablet 10 mg     Date Action Dose Route User   Discharged on 03/10/2023   Admitted on 03/10/2023   Discharged on 02/28/2023   Admitted on 02/27/2023   Discharged on 02/23/2023   Admitted on 02/23/2023   Discharged on 02/07/2023   Admitted on 02/07/2023   Discharged on 01/30/2023   Admitted on 01/30/2023   01/21/2023 1224 Given 10 mg Oral Heller, Rico Ala, RN      prochlorperazine (COMPAZINE) tablet 10 mg     Date Action Dose Route User   Discharged on 03/10/2023   Admitted on 03/10/2023   Discharged on 02/28/2023   Admitted on 02/27/2023   Discharged on 02/23/2023   Admitted on 02/23/2023   Discharged on 02/07/2023   Admitted on 02/07/2023   02/04/2023 1426 Given 10 mg Oral Heller, Rico Ala, RN       prochlorperazine (COMPAZINE) tablet 10 mg     Date Action Dose Route User   Discharged on 03/10/2023   Admitted on 03/10/2023   Discharged on 02/28/2023   Admitted on 02/27/2023   Discharged on 02/23/2023   Admitted on 02/23/2023   02/17/2023 0940 Given 10 mg Oral Heller, Rico Ala, RN      prochlorperazine (COMPAZINE) tablet 10 mg     Date Action Dose Route User   Discharged on 03/10/2023   Admitted on 03/10/2023   03/04/2023 1134 Given 10 mg Oral Heller, Rico Ala, RN      0.9 %  sodium chloride infusion     Date Action Dose Route User   Discharged on 03/10/2023   Admitted on 03/10/2023   Discharged on 02/28/2023   Admitted on 02/27/2023   Discharged on 02/23/2023   Admitted on 02/23/2023   Discharged on 02/07/2023   Admitted on 02/07/2023   Discharged on 01/30/2023   Admitted on 01/30/2023   01/21/2023 1423 Rate/Dose Change (none) Intravenous Nonah Mattes, RN   01/21/2023 1353 Rate/Dose Change (none) Intravenous Nonah Mattes, RN   01/21/2023 1346 Rate/Dose Change (none) Intravenous Nonah Mattes, RN   01/21/2023 1225 New Bag/Given (none) Intravenous Nonah Mattes, RN      0.9 %  sodium chloride infusion     Date Action Dose Route User   Discharged on 03/10/2023   Admitted on 03/10/2023   Discharged on 02/28/2023   Admitted on 02/27/2023   Discharged on 02/23/2023   Admitted on 02/23/2023   Discharged on 02/07/2023   Admitted on 02/07/2023   02/04/2023 1549 Rate/Dose Change (none) Intravenous Nonah Mattes, RN   02/04/2023 1544 Rate/Dose Change (none) Intravenous Heller, Rico Ala, RN  02/04/2023 1428 New Bag/Given (none) Intravenous Heller, Rico Ala, RN      0.9 %  sodium chloride infusion     Date Action Dose Route User   Discharged on 03/10/2023   Admitted on 03/10/2023   Discharged on 02/28/2023   Admitted on 02/27/2023   Discharged on 02/23/2023   Admitted on 02/23/2023   02/17/2023 1209 Infusion Verify (none) Intravenous Nonah Mattes, RN   02/17/2023 1208  Infusion Verify (none) Intravenous Nonah Mattes, RN   02/17/2023 1138 Rate/Dose Change (none) Intravenous Nonah Mattes, RN   02/17/2023 1133 Rate/Dose Change (none) Intravenous Nonah Mattes, RN   02/17/2023 0981 New Bag/Given (none) Intravenous Heller, Rico Ala, RN      0.9 %  sodium chloride infusion     Date Action Dose Route User   Discharged on 03/10/2023   Admitted on 03/10/2023   03/04/2023 1318 Restarted (none) Intravenous Delena Bali, RN   03/04/2023 1240 Infusion Verify (none) Intravenous Delena Bali, RN   03/04/2023 1240 Rate/Dose Change (none) Intravenous Delena Bali, RN   03/04/2023 1234 Rate/Dose Change (none) Intravenous Delena Bali, RN   03/04/2023 1207 Infusion Verify (none) Intravenous Delena Bali, RN      sodium chloride flush (NS) 0.9 % injection 10 mL     Date Action Dose Route User   Discharged on 03/10/2023   Admitted on 03/10/2023   Discharged on 02/28/2023   Admitted on 02/27/2023   Discharged on 02/23/2023   Admitted on 02/23/2023   Discharged on 02/07/2023   Admitted on 02/07/2023   Discharged on 01/30/2023   Admitted on 01/30/2023   01/17/2023 1348 Given 10 mL Intravenous Wandalee Ferdinand, RN      sodium chloride flush (NS) 0.9 % injection 10 mL     Date Action Dose Route User   Discharged on 03/10/2023   Admitted on 03/10/2023   Discharged on 02/28/2023   Admitted on 02/27/2023   Discharged on 02/23/2023   Admitted on 02/23/2023   Discharged on 02/07/2023   Admitted on 02/07/2023   Discharged on 01/30/2023   Admitted on 01/30/2023   01/21/2023 1427 Given 10 mL Intracatheter Heller, Rico Ala, RN      sodium chloride flush (NS) 0.9 % injection 10 mL     Date Action Dose Route User   Discharged on 03/10/2023   Admitted on 03/10/2023   Discharged on 02/28/2023   Admitted on 02/27/2023   Discharged on 02/23/2023   Admitted on 02/23/2023   02/17/2023 1213 Given 10 mL Intracatheter Heller, Rico Ala, RN      sodium chloride flush (NS) 0.9 %  injection 10 mL     Date Action Dose Route User   Discharged on 03/10/2023   Admitted on 03/10/2023   03/04/2023 1318 Given 10 mL Intracatheter Delena Bali, RN          Latest Ref Rng & Units 08/07/2020    9:34 AM  PFT Results  FVC-Pre L 3.87   FVC-Predicted Pre % 104   FVC-Post L 3.88   FVC-Predicted Post % 104   Pre FEV1/FVC % % 75   Post FEV1/FCV % % 83   FEV1-Pre L 2.92   FEV1-Predicted Pre % 111   FEV1-Post L 3.22   DLCO uncorrected ml/min/mmHg 22.16   DLCO UNC% % 96   DLCO corrected ml/min/mmHg 22.16   DLCO COR %Predicted % 96   DLVA Predicted % 87   TLC L  6.93   TLC % Predicted % 104   RV % Predicted % 120     No results found for: "NITRICOXIDE"      Assessment & Plan:   Pneumonia of left lower lobe due to infectious organism Clinically improved follow-up chest x-ray February 27, 2023 showed significant improvement with improved aeration. No further treatment at this time is indicated.  Continue with mucociliary clearance.  Bronchiectasis without complication (HCC) Mild bronchiectasis.-Encouraged on mucociliary clearance with flutter valve and saline nebs.  Combined systolic and diastolic heart failure (HCC) Appears euvolemic on exam today.  Continue on current regimen.  Continue follow-up with cardiology  Obstructive sleep apnea Severe obstructive sleep apnea.  Patient is on nocturnal CPAP.  Has perceived benefit.  Repeat sleep study shows ongoing severe sleep apnea.  Patient needs a new CPAP machine.  Order was placed.  Use auto CPAP 5 to 15 cm H2O.  Plan  Patient Instructions  Saline nebs as needed for cough /congestion  Flutter valve Twice daily  As needed  cough/congestion   Order for new CPAP  Wear CPAP At bedtime  , all night long for at least 6hr or more  Follow up with Dr. Vassie Loll or Keondria Siever NP in 3 months and As needed        Rubye Oaks, NP 03/16/2023

## 2023-03-17 ENCOUNTER — Other Ambulatory Visit (HOSPITAL_BASED_OUTPATIENT_CLINIC_OR_DEPARTMENT_OTHER): Payer: Self-pay

## 2023-03-17 ENCOUNTER — Inpatient Hospital Stay: Payer: No Typology Code available for payment source

## 2023-03-17 ENCOUNTER — Inpatient Hospital Stay: Payer: Non-veteran care | Attending: Oncology | Admitting: Oncology

## 2023-03-17 ENCOUNTER — Other Ambulatory Visit: Payer: Self-pay | Admitting: *Deleted

## 2023-03-17 ENCOUNTER — Encounter: Payer: Self-pay | Admitting: *Deleted

## 2023-03-17 VITALS — BP 151/81 | HR 60 | Resp 18

## 2023-03-17 DIAGNOSIS — J479 Bronchiectasis, uncomplicated: Secondary | ICD-10-CM | POA: Diagnosis not present

## 2023-03-17 DIAGNOSIS — I4891 Unspecified atrial fibrillation: Secondary | ICD-10-CM | POA: Diagnosis not present

## 2023-03-17 DIAGNOSIS — Z85038 Personal history of other malignant neoplasm of large intestine: Secondary | ICD-10-CM | POA: Diagnosis not present

## 2023-03-17 DIAGNOSIS — D649 Anemia, unspecified: Secondary | ICD-10-CM | POA: Insufficient documentation

## 2023-03-17 DIAGNOSIS — Z9884 Bariatric surgery status: Secondary | ICD-10-CM | POA: Diagnosis not present

## 2023-03-17 DIAGNOSIS — C259 Malignant neoplasm of pancreas, unspecified: Secondary | ICD-10-CM

## 2023-03-17 DIAGNOSIS — C250 Malignant neoplasm of head of pancreas: Secondary | ICD-10-CM | POA: Insufficient documentation

## 2023-03-17 DIAGNOSIS — K831 Obstruction of bile duct: Secondary | ICD-10-CM | POA: Insufficient documentation

## 2023-03-17 DIAGNOSIS — G4730 Sleep apnea, unspecified: Secondary | ICD-10-CM | POA: Insufficient documentation

## 2023-03-17 DIAGNOSIS — K219 Gastro-esophageal reflux disease without esophagitis: Secondary | ICD-10-CM | POA: Diagnosis not present

## 2023-03-17 DIAGNOSIS — I10 Essential (primary) hypertension: Secondary | ICD-10-CM | POA: Insufficient documentation

## 2023-03-17 DIAGNOSIS — Z5111 Encounter for antineoplastic chemotherapy: Secondary | ICD-10-CM | POA: Diagnosis present

## 2023-03-17 DIAGNOSIS — I2510 Atherosclerotic heart disease of native coronary artery without angina pectoris: Secondary | ICD-10-CM | POA: Insufficient documentation

## 2023-03-17 DIAGNOSIS — M109 Gout, unspecified: Secondary | ICD-10-CM | POA: Diagnosis not present

## 2023-03-17 LAB — CMP (CANCER CENTER ONLY)
ALT: 13 U/L (ref 0–44)
AST: 16 U/L (ref 15–41)
Albumin: 3.2 g/dL — ABNORMAL LOW (ref 3.5–5.0)
Alkaline Phosphatase: 65 U/L (ref 38–126)
Anion gap: 10 (ref 5–15)
BUN: 24 mg/dL — ABNORMAL HIGH (ref 8–23)
CO2: 28 mmol/L (ref 22–32)
Calcium: 8.4 mg/dL — ABNORMAL LOW (ref 8.9–10.3)
Chloride: 104 mmol/L (ref 98–111)
Creatinine: 1.05 mg/dL (ref 0.61–1.24)
GFR, Estimated: >60 mL/min (ref >60)
Glucose, Bld: 126 mg/dL — ABNORMAL HIGH (ref 70–99)
Potassium: 3.5 mmol/L (ref 3.5–5.1)
Sodium: 142 mmol/L (ref 135–145)
Total Bilirubin: 0.3 mg/dL (ref 0.3–1.2)
Total Protein: 6.1 g/dL — ABNORMAL LOW (ref 6.5–8.1)

## 2023-03-17 LAB — CBC WITH DIFFERENTIAL (CANCER CENTER ONLY)
Abs Immature Granulocytes: 0.1 10*3/uL — ABNORMAL HIGH (ref 0.00–0.07)
Basophils Absolute: 0 10*3/uL (ref 0.0–0.1)
Basophils Relative: 1 %
Eosinophils Absolute: 0.1 10*3/uL (ref 0.0–0.5)
Eosinophils Relative: 3 %
HCT: 27.5 % — ABNORMAL LOW (ref 39.0–52.0)
Hemoglobin: 8.7 g/dL — ABNORMAL LOW (ref 13.0–17.0)
Immature Granulocytes: 2 %
Lymphocytes Relative: 27 %
Lymphs Abs: 1.2 10*3/uL (ref 0.7–4.0)
MCH: 27.7 pg (ref 26.0–34.0)
MCHC: 31.6 g/dL (ref 30.0–36.0)
MCV: 87.6 fL (ref 80.0–100.0)
Monocytes Absolute: 0.7 10*3/uL (ref 0.1–1.0)
Monocytes Relative: 15 %
Neutro Abs: 2.3 10*3/uL (ref 1.7–7.7)
Neutrophils Relative %: 52 %
Platelet Count: 237 10*3/uL (ref 150–400)
RBC: 3.14 MIL/uL — ABNORMAL LOW (ref 4.22–5.81)
RDW: 16.6 % — ABNORMAL HIGH (ref 11.5–15.5)
WBC Count: 4.4 10*3/uL (ref 4.0–10.5)
nRBC: 0 % (ref 0.0–0.2)

## 2023-03-17 MED ORDER — HEPARIN SOD (PORK) LOCK FLUSH 100 UNIT/ML IV SOLN
500.0000 [IU] | Freq: Once | INTRAVENOUS | Status: AC | PRN
  Administered 2023-03-17: 500 [IU]

## 2023-03-17 MED ORDER — SODIUM CHLORIDE 0.9% FLUSH
10.0000 mL | INTRAVENOUS | Status: DC | PRN
  Administered 2023-03-17: 10 mL

## 2023-03-17 MED ORDER — PROCHLORPERAZINE MALEATE 10 MG PO TABS
10.0000 mg | ORAL_TABLET | Freq: Once | ORAL | Status: AC
  Administered 2023-03-17: 10 mg via ORAL
  Filled 2023-03-17: qty 1

## 2023-03-17 MED ORDER — SODIUM CHLORIDE 0.9 % IV SOLN
800.0000 mg/m2 | Freq: Once | INTRAVENOUS | Status: AC
  Administered 2023-03-17: 1710 mg via INTRAVENOUS
  Filled 2023-03-17: qty 26.28

## 2023-03-17 MED ORDER — PACLITAXEL PROTEIN-BOUND CHEMO INJECTION 100 MG
100.0000 mg/m2 | Freq: Once | INTRAVENOUS | Status: AC
  Administered 2023-03-17: 200 mg via INTRAVENOUS
  Filled 2023-03-17: qty 40

## 2023-03-17 MED ORDER — POTASSIUM CHLORIDE CRYS ER 10 MEQ PO TBCR
10.0000 meq | EXTENDED_RELEASE_TABLET | Freq: Two times a day (BID) | ORAL | 1 refills | Status: DC
  Filled 2023-03-17: qty 60, 30d supply, fill #0
  Filled 2023-05-17: qty 60, 30d supply, fill #1

## 2023-03-17 MED ORDER — SODIUM CHLORIDE 0.9 % IV SOLN: Freq: Once | INTRAVENOUS | Status: AC

## 2023-03-17 NOTE — Patient Instructions (Signed)
[facility]   Discharge Instructions: Thank you for choosing [facility] to provide your oncology and hematology care.   If you have a lab appointment with the Cancer Center, please go directly to the Cancer Center and check in at the registration area.   Wear comfortable clothing and clothing appropriate for easy access to any Portacath or PICC line.   We strive to give you quality time with your provider. You may need to reschedule your appointment if you arrive late (15 or more minutes).  Arriving late affects you and other patients whose appointments are after yours.  Also, if you miss three or more appointments without notifying the office, you may be dismissed from the clinic at the provider's discretion.      For prescription refill requests, have your pharmacy contact our office and allow 72 hours for refills to be completed.    Today you received the following chemotherapy and/or immunotherapy agents Paclitaxel-protein bound (ABRAXANE) & Gemcitabine (GEMZAR).   To help prevent nausea and vomiting after your treatment, we encourage you to take your nausea medication as directed.  BELOW ARE SYMPTOMS THAT SHOULD BE REPORTED IMMEDIATELY: *FEVER GREATER THAN 100.4 F (38 C) OR HIGHER *CHILLS OR SWEATING *NAUSEA AND VOMITING THAT IS NOT CONTROLLED WITH YOUR NAUSEA MEDICATION *UNUSUAL SHORTNESS OF BREATH *UNUSUAL BRUISING OR BLEEDING *URINARY PROBLEMS (pain or burning when urinating, or frequent urination) *BOWEL PROBLEMS (unusual diarrhea, constipation, pain near the anus) TENDERNESS IN MOUTH AND THROAT WITH OR WITHOUT PRESENCE OF ULCERS (sore throat, sores in mouth, or a toothache) UNUSUAL RASH, SWELLING OR PAIN  UNUSUAL VAGINAL DISCHARGE OR ITCHING   Items with * indicate a potential emergency and should be followed up as soon as possible or go to the Emergency Department if any problems should occur.  Please show the CHEMOTHERAPY  ALERT CARD or IMMUNOTHERAPY ALERT CARD at check-in to the Emergency Department and triage nurse.  Should you have questions after your visit or need to cancel or reschedule your appointment, please contact [facility]  Dept: [phone]  and follow the prompts.  Office hours are 8:00 a.m. to 4:30 p.m. Monday - Friday. Please note that voicemails left after 4:00 p.m. may not be returned until the following business day.  We are closed weekends and major holidays. You have access to a nurse at all times for urgent questions. Please call the main number to the clinic Dept: [phone] and follow the prompts.   For any non-urgent questions, you may also contact your provider using MyChart. We now offer e-Visits for anyone 18 and older to request care online for non-urgent symptoms. For details visit [email or web address].   Also download the MyChart app! Go to the app store, search "MyChart", open the app, select [facility], and log in with your MyChart username and password.  Paclitaxel Nanoparticle Albumin-Bound Injection What is this medication? NANOPARTICLE ALBUMIN-BOUND PACLITAXEL (Na no PAHR ti kuhl al BYOO muhn-bound PAK li TAX el) treats some types of cancer. It works by slowing down the growth of cancer cells. This medicine may be used for other purposes; ask your health care provider or pharmacist if you have questions. COMMON BRAND NAME(S): Abraxane What should I tell my care team before I take this medication? They need to know if you have any of these conditions: Liver disease Low white blood cell levels An unusual or allergic reaction to paclitaxel, albumin, other medications, foods, dyes, or preservatives   If you or your partner are pregnant or trying to get pregnant Breast-feeding How should I use this medication? This medication is injected into a vein. It is given by your care team in a hospital or clinic setting. Talk to your care team  about the use of this medication in children. Special care may be needed. Overdosage: If you think you have taken too much of this medicine contact a poison control center or emergency room at once. NOTE: This medicine is only for you. Do not share this medicine with others. What if I miss a dose? Keep appointments for follow-up doses. It is important not to miss your dose. Call your care team if you are unable to keep an appointment. What may interact with this medication? Other medications may affect the way this medication works. Talk with your care team about all of the medications you take. They may suggest changes to your treatment plan to lower the risk of side effects and to make sure your medications work as intended. This list may not describe all possible interactions. Give your health care provider a list of all the medicines, herbs, non-prescription drugs, or dietary supplements you use. Also tell them if you smoke, drink alcohol, or use illegal drugs. Some items may interact with your medicine. What should I watch for while using this medication? Your condition will be monitored carefully while you are receiving this medication. You may need blood work while taking this medication. This medication may make you feel generally unwell. This is not uncommon as chemotherapy can affect healthy cells as well as cancer cells. Report any side effects. Continue your course of treatment even though you feel ill unless your care team tells you to stop. This medication can cause serious allergic reactions. To reduce the risk, your care team may give you other medications to take before receiving this one. Be sure to follow the directions from your care team. This medication may increase your risk of getting an infection. Call your care team for advice if you get a fever, chills, sore throat, or other symptoms of a cold or flu. Do not treat yourself. Try to avoid being around people who are sick. This  medication may increase your risk to bruise or bleed. Call your care team if you notice any unusual bleeding. Be careful brushing or flossing your teeth or using a toothpick because you may get an infection or bleed more easily. If you have any dental work done, tell your dentist you are receiving this medication. Talk to your care team if you or your partner may be pregnant. Serious birth defects can occur if you take this medication during pregnancy and for 6 months after the last dose. You will need a negative pregnancy test before starting this medication. Contraception is recommended while taking this medication and for 6 months after the last dose. Your care team can help you find the option that works for you. If your partner can get pregnant, use a condom during sex while taking this medication and for 3 months after the last dose. Do not breastfeed while taking this medication and for 2 weeks after the last dose. This medication may cause infertility. Talk to your care team if you are concerned about your fertility. What side effects may I notice from receiving this medication? Side effects that you should report to your care team as soon as possible: Allergic reactions--skin rash, itching, hives, swelling of the face, lips, tongue, or throat Dry cough,   shortness of breath or trouble breathing Infection--fever, chills, cough, sore throat, wounds that don't heal, pain or trouble when passing urine, general feeling of discomfort or being unwell Low red blood cell level--unusual weakness or fatigue, dizziness, headache, trouble breathing Pain, tingling, or numbness in the hands or feet Stomach pain, unusual weakness or fatigue, nausea, vomiting, diarrhea, or fever that lasts longer than expected Unusual bruising or bleeding Side effects that usually do not require medical attention (report to your care team if they continue or are bothersome): Diarrhea Fatigue Hair loss Loss of  appetite Nausea Vomiting This list may not describe all possible side effects. Call your doctor for medical advice about side effects. You may report side effects to FDA at 1-800-FDA-1088. Where should I keep my medication? This medication is given in a hospital or clinic. It will not be stored at home. NOTE: This sheet is a summary. It may not cover all possible information. If you have questions about this medicine, talk to your doctor, pharmacist, or health care provider.  2024 Elsevier/Gold Standard (2021-11-05 00:00:00)  Gemcitabine Injection What is this medication? GEMCITABINE (jem SYE ta been) treats some types of cancer. It works by slowing down the growth of cancer cells. This medicine may be used for other purposes; ask your health care provider or pharmacist if you have questions. COMMON BRAND NAME(S): Gemzar, Infugem What should I tell my care team before I take this medication? They need to know if you have any of these conditions: Blood disorders Infection Kidney disease Liver disease Lung or breathing disease, such as asthma or COPD Recent or ongoing radiation therapy An unusual or allergic reaction to gemcitabine, other medications, foods, dyes, or preservatives If you or your partner are pregnant or trying to get pregnant Breast-feeding How should I use this medication? This medication is injected into a vein. It is given by your care team in a hospital or clinic setting. Talk to your care team about the use of this medication in children. Special care may be needed. Overdosage: If you think you have taken too much of this medicine contact a poison control center or emergency room at once. NOTE: This medicine is only for you. Do not share this medicine with others. What if I miss a dose? Keep appointments for follow-up doses. It is important not to miss your dose. Call your care team if you are unable to keep an appointment. What may interact with this  medication? Interactions have not been studied. This list may not describe all possible interactions. Give your health care provider a list of all the medicines, herbs, non-prescription drugs, or dietary supplements you use. Also tell them if you smoke, drink alcohol, or use illegal drugs. Some items may interact with your medicine. What should I watch for while using this medication? Your condition will be monitored carefully while you are receiving this medication. This medication may make you feel generally unwell. This is not uncommon, as chemotherapy can affect healthy cells as well as cancer cells. Report any side effects. Continue your course of treatment even though you feel ill unless your care team tells you to stop. In some cases, you may be given additional medications to help with side effects. Follow all directions for their use. This medication may increase your risk of getting an infection. Call your care team for advice if you get a fever, chills, sore throat, or other symptoms of a cold or flu. Do not treat yourself. Try to avoid being around   people who are sick. This medication may increase your risk to bruise or bleed. Call your care team if you notice any unusual bleeding. Be careful brushing or flossing your teeth or using a toothpick because you may get an infection or bleed more easily. If you have any dental work done, tell your dentist you are receiving this medication. Avoid taking medications that contain aspirin, acetaminophen, ibuprofen, naproxen, or ketoprofen unless instructed by your care team. These medications may hide a fever. Talk to your care team if you or your partner wish to become pregnant or think you might be pregnant. This medication can cause serious birth defects if taken during pregnancy and for 6 months after the last dose. A negative pregnancy test is required before starting this medication. A reliable form of contraception is recommended while taking  this medication and for 6 months after the last dose. Talk to your care team about effective forms of contraception. Do not father a child while taking this medication and for 3 months after the last dose. Use a condom while having sex during this time period. Do not breastfeed while taking this medication and for at least 1 week after the last dose. This medication may cause infertility. Talk to your care team if you are concerned about your fertility. What side effects may I notice from receiving this medication? Side effects that you should report to your care team as soon as possible: Allergic reactions--skin rash, itching, hives, swelling of the face, lips, tongue, or throat Capillary leak syndrome--stomach or muscle pain, unusual weakness or fatigue, feeling faint or lightheaded, decrease in the amount of urine, swelling of the ankles, hands, or feet, trouble breathing Infection--fever, chills, cough, sore throat, wounds that don't heal, pain or trouble when passing urine, general feeling of discomfort or being unwell Liver injury--right upper belly pain, loss of appetite, nausea, light-colored stool, dark yellow or brown urine, yellowing skin or eyes, unusual weakness or fatigue Low red blood cell level--unusual weakness or fatigue, dizziness, headache, trouble breathing Lung injury--shortness of breath or trouble breathing, cough, spitting up blood, chest pain, fever Stomach pain, bloody diarrhea, pale skin, unusual weakness or fatigue, decrease in the amount of urine, which may be signs of hemolytic uremic syndrome Sudden and severe headache, confusion, change in vision, seizures, which may be signs of posterior reversible encephalopathy syndrome (PRES) Unusual bruising or bleeding Side effects that usually do not require medical attention (report to your care team if they continue or are bothersome): Diarrhea Drowsiness Hair loss Nausea Pain, redness, or swelling with sores inside the  mouth or throat Vomiting This list may not describe all possible side effects. Call your doctor for medical advice about side effects. You may report side effects to FDA at 1-800-FDA-1088. Where should I keep my medication? This medication is given in a hospital or clinic. It will not be stored at home. NOTE: This sheet is a summary. It may not cover all possible information. If you have questions about this medicine, talk to your doctor, pharmacist, or health care provider.  2024 Elsevier/Gold Standard (2021-10-27 00:00:00)  

## 2023-03-17 NOTE — Progress Notes (Signed)
Patient seen by Dr. Thornton Papas today  Vitals are within treatment parameters.No intervention for BP 151/71  Labs reviewed by Dr. Truett Perna and are within treatment parameters.  Per physician team, patient is ready for treatment and there are NO modifications to the treatment plan.

## 2023-03-17 NOTE — Progress Notes (Signed)
[facility] OFFICE PROGRESS NOTE   Diagnosis: Pancreas cancer  INTERVAL HISTORY:   Marcus [name] returns as scheduled.  He completed another cycle of gemcitabine/Abraxane on 03/04/2023.  No fever, rash, or diarrhea. He was seen in the emergency room 03/10/2023 with right hand swelling and warmth.  A gout flare was suspected.  He was treated for cellulitis.  He reports the hand swelling has improved. Objective:  Vital signs in last 24 hours:  Blood pressure (!) 151/71, pulse 69, temperature 98.2 F (36.8 C), temperature source Oral, resp. rate 18, height 5\' 8"  (1.727 m), weight 213 lb (96.6 kg), SpO2 100%.    HEENT: No thrush or ulcers, small ecchymosis at the right buccal mucos Resp: Lungs clear bilaterally Cardio: Regular rate and rhythm GI: No hepatosplenomegaly, no apparent ascites Vascular: Trace pitting edema at the lower leg bilaterally with chronic stasis change, mild edema at the dorsum of the right hand    Portacath/PICC-without erythema  Lab Results:  Lab Results  Component Value Date   WBC 4.4 03/17/2023   HGB 8.7 (L) 03/17/2023   HCT 27.5 (L) 03/17/2023   MCV 87.6 03/17/2023   PLT 237 03/17/2023   NEUTROABS 2.3 03/17/2023    CMP  Lab Results  Component Value Date   NA 142 03/17/2023   K 3.5 03/17/2023   CL 104 03/17/2023   CO2 28 03/17/2023   GLUCOSE 126 (H) 03/17/2023   BUN 24 (H) 03/17/2023   CREATININE 1.05 03/17/2023   CALCIUM 8.4 (L) 03/17/2023   PROT 6.1 (L) 03/17/2023   ALBUMIN 3.2 (L) 03/17/2023   AST 16 03/17/2023   ALT 13 03/17/2023   ALKPHOS 65 03/17/2023   BILITOT 0.3 03/17/2023   GFRNONAA >60 03/17/2023   GFRAA >60 02/07/2019    Lab Results  Component Value Date   CEA1 6.4 (H) 12/11/2022   CEA 1.1 03/03/2010   [id number] 840 (H) 02/17/2023     Medications: I have reviewed the patient's current medications.   Assessment/Plan: Pancreas cancer CT abdomen/pelvis 12/03/2022-suspected pancreas mass in the proximal body  measuring 2 cm with probable mass effect on the superior aspect of the SMV, multiple hypodense liver lesions, many are new from a remote CT-indeterminate, 2 cystic lesions in the pancreas tail-nonspecific prior bariatric surgery ERCP 12/13/2022-malignant appearing common bile duct stricture, plastic stent placed, brushing cytology-no malignant cells EUS 12/23/2022-30 x 33 mm pancreas head mass with invasion of the SMV, no malignant appearing lymph nodes, 4 cysts in the visualized liver, FNA-adenocarcinoma Cycle 1 gemcitabine and abraxane 01/07/2023 CTs 01/09/2023-multiple low-attenuation liver lesions unchanged, largest of which are clearly benign fluid attenuation cyst, others more ill-defined and incompletely characterized suspicious for small metastases.  Similar appearance of hypodense mass in the pancreatic head.  Distended gallbladder with mild gallbladder wall thickening and adjacent pericholecystic fat stranding.  Sludge in the gallbladder fundus.  Common bile duct stent in place with diminished, persistent intrahepatic biliary ductal dilatation.  Negative for pulmonary embolism.  Acute appearing heterogeneous airspace opacity of the superior segment left lower lobe consistent with infection or aspiration. 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  He will complete cycle 6 gemcitabine/Abraxane today.  We will follow-up on the CA 19-9 from today.  He will return for an office visit in 2 weeks.  The right hand swelling may have been related to a gout flare or gemcitabine.  The swelling has improved.  He is completing a course of doxycycline.    Thornton Papas, MD  03/17/2023  9:40 AM

## 2023-03-18 ENCOUNTER — Ambulatory Visit: Payer: Medicare Other

## 2023-03-18 ENCOUNTER — Other Ambulatory Visit: Payer: Medicare Other

## 2023-03-18 ENCOUNTER — Ambulatory Visit: Payer: Medicare Other | Admitting: Oncology

## 2023-03-18 ENCOUNTER — Other Ambulatory Visit (HOSPITAL_BASED_OUTPATIENT_CLINIC_OR_DEPARTMENT_OTHER): Payer: Self-pay

## 2023-03-18 LAB — CANCER ANTIGEN 19-9: CA 19-9: 544 U/mL — ABNORMAL HIGH (ref 0–35)

## 2023-03-21 ENCOUNTER — Telehealth: Payer: Self-pay

## 2023-03-21 NOTE — Telephone Encounter (Signed)
-----   Message from Thornton Papas sent at 03/19/2023  6:45 AM EDT ----- Please call patient, the CA 19-9 is improved, follow-up as scheduled

## 2023-03-21 NOTE — Telephone Encounter (Signed)
Patient gave verbal understanding and had no further questions or concerns

## 2023-03-23 ENCOUNTER — Other Ambulatory Visit: Payer: Self-pay

## 2023-03-23 ENCOUNTER — Emergency Department (HOSPITAL_BASED_OUTPATIENT_CLINIC_OR_DEPARTMENT_OTHER)
Admission: EM | Admit: 2023-03-23 | Discharge: 2023-03-24 | Disposition: A | Discharge status: Home / Self Care | Payer: Medicare Other | Attending: Emergency Medicine | Admitting: Emergency Medicine

## 2023-03-23 ENCOUNTER — Encounter (HOSPITAL_BASED_OUTPATIENT_CLINIC_OR_DEPARTMENT_OTHER): Payer: Self-pay

## 2023-03-23 DIAGNOSIS — Z7901 Long term (current) use of anticoagulants: Secondary | ICD-10-CM | POA: Diagnosis not present

## 2023-03-23 DIAGNOSIS — M79601 Pain in right arm: Secondary | ICD-10-CM | POA: Diagnosis not present

## 2023-03-23 DIAGNOSIS — L03113 Cellulitis of right upper limb: Secondary | ICD-10-CM | POA: Diagnosis not present

## 2023-03-23 DIAGNOSIS — M7989 Other specified soft tissue disorders: Secondary | ICD-10-CM | POA: Diagnosis not present

## 2023-03-23 DIAGNOSIS — Z8507 Personal history of malignant neoplasm of pancreas: Secondary | ICD-10-CM | POA: Insufficient documentation

## 2023-03-23 DIAGNOSIS — Z950 Presence of cardiac pacemaker: Secondary | ICD-10-CM | POA: Insufficient documentation

## 2023-03-23 DIAGNOSIS — D72819 Decreased white blood cell count, unspecified: Secondary | ICD-10-CM | POA: Insufficient documentation

## 2023-03-23 DIAGNOSIS — I509 Heart failure, unspecified: Secondary | ICD-10-CM | POA: Diagnosis not present

## 2023-03-23 LAB — COMPREHENSIVE METABOLIC PANEL
ALT: 16 U/L (ref 0–44)
AST: 19 U/L (ref 15–41)
Albumin: 3.3 g/dL — ABNORMAL LOW (ref 3.5–5.0)
Alkaline Phosphatase: 69 U/L (ref 38–126)
Anion gap: 5 (ref 5–15)
BUN: 24 mg/dL — ABNORMAL HIGH (ref 8–23)
CO2: 27 mmol/L (ref 22–32)
Calcium: 8.8 mg/dL — ABNORMAL LOW (ref 8.9–10.3)
Chloride: 107 mmol/L (ref 98–111)
Creatinine, Ser: 1.03 mg/dL (ref 0.61–1.24)
GFR, Estimated: >60 mL/min (ref >60)
Glucose, Bld: 116 mg/dL — ABNORMAL HIGH (ref 70–99)
Potassium: 4 mmol/L (ref 3.5–5.1)
Sodium: 139 mmol/L (ref 135–145)
Total Bilirubin: 0.3 mg/dL (ref 0.3–1.2)
Total Protein: 6.3 g/dL — ABNORMAL LOW (ref 6.5–8.1)

## 2023-03-23 LAB — URINALYSIS, W/ REFLEX TO CULTURE (INFECTION SUSPECTED)
Bacteria, UA: NONE SEEN
Bilirubin Urine: NEGATIVE
Glucose, UA: NEGATIVE mg/dL
Hgb urine dipstick: NEGATIVE
Ketones, ur: NEGATIVE mg/dL
Leukocytes,Ua: NEGATIVE
Nitrite: NEGATIVE
Specific Gravity, Urine: 1.023 (ref 1.005–1.030)
pH: 5.5 (ref 5.0–8.0)

## 2023-03-23 LAB — CBC WITH DIFFERENTIAL/PLATELET
Abs Immature Granulocytes: 0.03 10*3/uL (ref 0.00–0.07)
Basophils Absolute: 0 10*3/uL (ref 0.0–0.1)
Basophils Relative: 1 %
Eosinophils Absolute: 0.1 10*3/uL (ref 0.0–0.5)
Eosinophils Relative: 2 %
HCT: 26.9 % — ABNORMAL LOW (ref 39.0–52.0)
Hemoglobin: 8.3 g/dL — ABNORMAL LOW (ref 13.0–17.0)
Immature Granulocytes: 1 %
Lymphocytes Relative: 40 %
Lymphs Abs: 1.2 10*3/uL (ref 0.7–4.0)
MCH: 27.4 pg (ref 26.0–34.0)
MCHC: 30.9 g/dL (ref 30.0–36.0)
MCV: 88.8 fL (ref 80.0–100.0)
Monocytes Absolute: 0.2 10*3/uL (ref 0.1–1.0)
Monocytes Relative: 8 %
Neutro Abs: 1.4 10*3/uL — ABNORMAL LOW (ref 1.7–7.7)
Neutrophils Relative %: 48 %
Platelets: 216 10*3/uL (ref 150–400)
RBC: 3.03 MIL/uL — ABNORMAL LOW (ref 4.22–5.81)
RDW: 17 % — ABNORMAL HIGH (ref 11.5–15.5)
WBC: 2.9 10*3/uL — ABNORMAL LOW (ref 4.0–10.5)
nRBC: 0 % (ref 0.0–0.2)

## 2023-03-23 LAB — LACTIC ACID, PLASMA: Lactic Acid, Venous: 0.6 mmol/L (ref 0.5–1.9)

## 2023-03-23 NOTE — ED Triage Notes (Signed)
Patient arrives to ED POV C/O RT hand swelling that started this morning. Pt states he was seen here for possible cellulitis. Redness and swelling is noted to RT hand. Pt is a cancer pt and is currently undergoing treatment.

## 2023-03-24 ENCOUNTER — Other Ambulatory Visit: Payer: Self-pay

## 2023-03-24 ENCOUNTER — Other Ambulatory Visit (HOSPITAL_COMMUNITY): Payer: Self-pay

## 2023-03-24 ENCOUNTER — Other Ambulatory Visit (HOSPITAL_BASED_OUTPATIENT_CLINIC_OR_DEPARTMENT_OTHER): Payer: Self-pay

## 2023-03-24 ENCOUNTER — Emergency Department (HOSPITAL_BASED_OUTPATIENT_CLINIC_OR_DEPARTMENT_OTHER): Payer: Medicare Other

## 2023-03-24 ENCOUNTER — Encounter: Payer: Self-pay | Admitting: Oncology

## 2023-03-24 DIAGNOSIS — M79601 Pain in right arm: Secondary | ICD-10-CM | POA: Diagnosis not present

## 2023-03-24 MED ORDER — DOXYCYCLINE HYCLATE 100 MG PO TABS
100.0000 mg | ORAL_TABLET | Freq: Once | ORAL | Status: AC
  Administered 2023-03-24: 100 mg via ORAL
  Filled 2023-03-24: qty 1

## 2023-03-24 MED ORDER — DOXYCYCLINE HYCLATE 100 MG PO CAPS
100.0000 mg | ORAL_CAPSULE | Freq: Two times a day (BID) | ORAL | 0 refills | Status: DC
  Filled 2023-03-24: qty 14, 7d supply, fill #0
  Filled 2023-03-24: qty 28, 14d supply, fill #0

## 2023-03-24 NOTE — Discharge Instructions (Signed)
Begin taking doxycycline as prescribed.  Follow-up with your oncologist/primary care doctor in the next few days, and return to the ER if you develop worsening pain, redness extending further up the arm, high fevers, or for other new and concerning symptoms.

## 2023-03-24 NOTE — ED Provider Notes (Signed)
EMERGENCY DEPARTMENT AT [facility] Provider Note   CSN: [id number] Arrival date & time: 03/23/23  2155     History  Chief Complaint  Patient presents with   Joint Swelling    Marcus [name] is a 82 y.o. male.  Patient is an 82 year old male with history of pancreatic cancer, atrial fibrillation, CHF, pacemaker.  Patient presenting with complaints of right arm pain and swelling.  This has been worsening over the past 2 days.  He was recently treated with antibiotics for the same condition which turned out to be suspected cellulitis.  Symptoms did improve, but have returned.  He denies any fevers or chills.  He was sent by his oncologist for rule out of DVT.  He did have an ultrasound several weeks ago with this same condition, but was negative.  The history is provided by the patient.       Home Medications Prior to Admission medications   Medication Sig Start Date End Date Taking? Authorizing Provider  acetaminophen (TYLENOL) 325 MG tablet Take 2 tablets (650 mg total) by mouth every 6 (six) hours as needed for mild pain (or Fever >/= 101). 01/13/23   Sheikh, Omair Latif, DO  allopurinol (ZYLOPRIM) 300 MG tablet Take 1 tablet (300 mg total) by mouth daily. 05/25/22   Nafziger, Kandee Keen, NP  amLODipine (NORVASC) 10 MG tablet Take 1 tablet (10 mg total) by mouth daily. Patient not taking: Reported on 03/17/2023 05/25/22   [name], NP  apixaban (ELIQUIS) 5 MG TABS tablet Take 1 tablet (5 mg total) by mouth 2 (two) times daily. 02/23/23 05/24/23  Nafziger, Kandee Keen, NP  Calcium Carb-Cholecalciferol (CALCIUM 600 + D PO) Take 1 tablet by mouth daily. 800 units of vitamin D    [provider]  docusate sodium (COLACE) 100 MG capsule Take 1 capsule (100 mg total) by mouth 2 (two) times daily. 02/15/23 05/16/23  Nafziger, Kandee Keen, NP  esomeprazole (NEXIUM) 40 MG capsule Take 1 capsule (40 mg total) by mouth 2 (two) times daily before a meal. Patient not taking:  Reported on 03/17/2023 12/24/22     Eszopiclone 3 MG TABS Take 1 tablet (3 mg total) by mouth at bedtime. Take immediately before bedtime 02/23/23   Nafziger, Kandee Keen, NP  guaiFENesin (MUCINEX) 600 MG 12 hr tablet Take 600 mg by mouth 2 (two) times daily as needed for cough. Patient not taking: Reported on 03/17/2023    [provider]  ipratropium (ATROVENT) 0.03 % nasal spray Place 2 sprays into both nostrils every 12 (twelve) hours. Patient not taking: Reported on 03/17/2023 05/03/22     lidocaine-prilocaine (EMLA) cream Apply 1 Application topically as needed. Patient not taking: Reported on 03/17/2023 12/31/22   [name], MD  Multiple Vitamin (MULTIVITAMIN) tablet Take 1 tablet by mouth in the morning.    [provider]  ondansetron (ZOFRAN) 8 MG tablet Take 1 tablet (8 mg total) by mouth every 8 (eight) hours as needed. Patient not taking: Reported on 03/17/2023 12/31/22     oxybutynin (DITROPAN) 5 MG tablet Take 2.5 mg by mouth 2 (two) times daily.    [provider]  polyethylene glycol powder (GLYCOLAX/MIRALAX) 17 GM/SCOOP powder Take 17g (1 capful) by mouth as directed daily as needed for mild constipation. Patient not taking: Reported on 03/17/2023 12/15/22   Rolly Salter, MD  potassium chloride (KLOR-CON M) 10 MEQ tablet Take 1 tablet (10 mEq total) by mouth 2 (two) times daily. 03/17/23   Sherrill,  Leighton Roach, MD  prochlorperazine (COMPAZINE) 10 MG tablet Take 1 tablet (10 mg total) by mouth every 6 (six) hours as needed for nausea or vomiting Patient not taking: Reported on 03/17/2023 12/31/22     Respiratory Therapy Supplies (FLUTTER) DEVI 1 each by Does not apply route in the morning and at bedtime. Patient not taking: Reported on 03/17/2023 10/11/19   Karie Fetch P, DO  sodium chloride HYPERTONIC 3 % nebulizer solution Take by nebulization as needed for other. Patient not taking: Reported on 03/17/2023 04/01/22   [name], NP  sucralfate (CARAFATE) 1  GM/10ML suspension Take 10 mLs (1 g total) by mouth 2 (two) times daily. 12/23/22 12/23/23  Mansouraty, Netty Starring., MD  tadalafil (CIALIS) 20 MG tablet Take 0.5-1 tablets (10-20 mg total) by mouth daily as needed 1-2 hours before planned activity. Patient not taking: Reported on 03/17/2023 10/04/22     tamsulosin (FLOMAX) 0.4 MG CAPS capsule Take 0.4 mg by mouth daily. 03/04/16   [provider]  torsemide (DEMADEX) 20 MG tablet Take 1 tablet (20 mg total) by mouth daily. 03/09/23   Nafziger, Kandee Keen, NP  valsartan-hydrochlorothiazide (DIOVAN-HCT) 320-25 MG tablet Take 1 tablet by mouth daily. Patient not taking: Reported on 03/17/2023 11/10/22   [name], NP  Vibegron (GEMTESA) 75 MG TABS Take 1 tablet (75 mg total) by mouth daily. Patient not taking: Reported on 03/17/2023 11/24/22         Allergies    Patient has no known allergies.    Review of Systems   Review of Systems  All other systems reviewed and are negative.   Physical Exam Updated Vital Signs BP (!) 153/69   Pulse (!) 59   Temp 98.7 F (37.1 C)   Resp 20   Ht 5\' 8"  (1.727 m)   Wt 95.3 kg   SpO2 98%   BMI 31.93 kg/m  Physical Exam Vitals and nursing note reviewed.  Constitutional:      General: He is not in acute distress.    Appearance: He is well-developed. He is not diaphoretic.  HENT:     Head: Normocephalic and atraumatic.  Cardiovascular:     Rate and Rhythm: Normal rate and regular rhythm.     Heart sounds: No murmur heard.    No friction rub.  Pulmonary:     Effort: Pulmonary effort is normal. No respiratory distress.     Breath sounds: Normal breath sounds. No wheezing or rales.  Abdominal:     General: Bowel sounds are normal. There is no distension.     Palpations: Abdomen is soft.     Tenderness: There is no abdominal tenderness.  Musculoskeletal:        General: Normal range of motion.     Cervical back: Normal range of motion and neck supple.     Comments: There is redness, warmth, and  swelling noted from just above the wrist extending into the dorsum of the hand and fingers.  This is warm to the touch.  Capillary refill is brisk.  Ulnar and radial pulses easily palpable.  Skin:    General: Skin is warm and dry.  Neurological:     Mental Status: He is alert and oriented to person, place, and time.     Coordination: Coordination normal.     ED Results / Procedures / Treatments   Labs (all labs ordered are listed, but only abnormal results are displayed) Labs Reviewed  COMPREHENSIVE METABOLIC PANEL - Abnormal; Notable for the  following components:      Result Value   Glucose, Bld 116 (*)    BUN 24 (*)    Calcium 8.8 (*)    Total Protein 6.3 (*)    Albumin 3.3 (*)    All other components within normal limits  CBC WITH DIFFERENTIAL/PLATELET - Abnormal; Notable for the following components:   WBC 2.9 (*)    RBC 3.03 (*)    Hemoglobin 8.3 (*)    HCT 26.9 (*)    RDW 17.0 (*)    Neutro Abs 1.4 (*)    All other components within normal limits  URINALYSIS, W/ REFLEX TO CULTURE (INFECTION SUSPECTED) - Abnormal; Notable for the following components:   Protein, ur TRACE (*)    All other components within normal limits  LACTIC ACID, PLASMA  LACTIC ACID, PLASMA    EKG None  Radiology No results found.  Procedures Procedures    Medications Ordered in ED Medications - No data to display  ED Course/ Medical Decision Making/ A&P  Patient with history of pancreatic cancer presenting with complaints of right arm pain and swelling.  He has redness, warmth, with swelling extending from the hand above the wrist.  He was diagnosed recently with cellulitis which improved with doxycycline, but has now recurred 2 weeks later.  Patient arrives here with stable vital signs and is afebrile.  He is clinically well-appearing.  Workup initiated in triage including CBC, CMP, lactate, all of which are unremarkable.  White count is 2.9.  Ultrasound obtained showing no evidence  for DVT.  At this point, I am convinced that this is cellulitis that has recurred.  Patient will be treated again with doxycycline, but I will provide a longer course and see if this helps.  Patient to follow-up with his primary providers.  Final Clinical Impression(s) / ED Diagnoses Final diagnoses:  None    Rx / DC Orders ED Discharge Orders     None         Geoffery Lyons, MD 03/24/23 [phone]

## 2023-03-25 ENCOUNTER — Telehealth: Payer: Self-pay | Admitting: Adult Health

## 2023-03-25 ENCOUNTER — Ambulatory Visit (INDEPENDENT_AMBULATORY_CARE_PROVIDER_SITE_OTHER): Payer: Medicare Other | Admitting: Adult Health

## 2023-03-25 ENCOUNTER — Encounter: Payer: Self-pay | Admitting: Adult Health

## 2023-03-25 VITALS — BP 138/60 | HR 48 | Temp 97.7°F | Ht 68.0 in | Wt 215.0 lb

## 2023-03-25 DIAGNOSIS — L03113 Cellulitis of right upper limb: Secondary | ICD-10-CM

## 2023-03-25 NOTE — Telephone Encounter (Signed)
Awaitng CMN

## 2023-03-25 NOTE — Progress Notes (Signed)
Subjective:    Patient ID: Marcus [name], male    DOB: 08-05-1940, 82 y.o.   MRN: [id number]  HPI  82 year old male who  has a past medical history of Acute meniscal tear of knee (left), Arthritis, AV block (08/01/2018), Benign essential hypertension (01/18/2007), Bronchiectasis with acute exacerbation (06/23/2020), Chest pain, atypical (08/23/2014), Chronic cough (10/07/2014), Coronary artery disease, Degeneration of lumbar intervertebral disc (10/14/2017), Diverticulosis of colon (07/07/2005), Elevated PSA (06/19/2014), GERD (gastroesophageal reflux disease) (01/18/2007), Gout, unspecified (01/18/2007), Hemorrhoids (01/19/2011), Hiatal hernia, History of colonic polyps (01/18/2007), Insomnia, unspecified (08/21/2007), Iron deficiency anemia, unspecified  (01/19/2011), Lumbar post-laminectomy syndrome (10/14/2017), Lumbar spondylolysis, Lumbar strain (12/14/2011), Obstructive sleep apnea (01/18/2007), Paraesophageal hernia, Primary osteoarthritis of knee (05/23/2015), Prostate cancer (HCC) (2016), S/P knee replacement (05/23/2015), Sensorineural hearing loss, bilateral, Vitamin B12 deficiency, and Vitamin D deficiency (10/28/2009).  He presents to the office today for follow-up after being seen in the emergency room 2 days ago for right arm cellulitis.  He presented with a complaint of right arm pain and swelling that had been worsening over the previous 2 days.  He was recently treated with antibiotics for this cellulitis and his symptoms did improve at that time.  He did not have any fevers or chills.  Workup included a CBC, CMP, lactate all were unremarkable, his white count was 2.9.  He is undergoing treatment for pancreatic cancer.  An ultrasound showed no evidence of DVT.  He was started on a longer course of doxycycline, 14 days.  He reports that he has started this medication and has noticed significant improvement in the swelling of his right hand as well as redness and warmth.  Review of  Systems See HPI   Past Medical History:  Diagnosis Date   Acute meniscal tear of knee left   Arthritis    generalized   AV block 08/01/2018   Benign essential hypertension 01/18/2007   Bronchiectasis with acute exacerbation 06/23/2020   Chest pain, atypical 08/23/2014   Chronic cough 10/07/2014   Coronary artery disease    Degeneration of lumbar intervertebral disc 10/14/2017   Diverticulosis of colon 07/07/2005   Elevated PSA 06/19/2014   GERD (gastroesophageal reflux disease) 01/18/2007   Gout, unspecified 01/18/2007   Hemorrhoids 01/19/2011   Hiatal hernia    History of colonic polyps 01/18/2007   Insomnia, unspecified 08/21/2007   Iron deficiency anemia, unspecified  01/19/2011   Lumbar post-laminectomy syndrome 10/14/2017   Lumbar spondylolysis    Lumbar strain 12/14/2011   Obstructive sleep apnea 01/18/2007   uses CPAP nightly   Paraesophageal hernia    large   Primary osteoarthritis of knee 05/23/2015   Prostate cancer (HCC) 2016   S/P knee replacement 05/23/2015   Sensorineural hearing loss, bilateral    Vitamin B12 deficiency    Vitamin D deficiency 10/28/2009    Social History   Socioeconomic History   Marital status: Widowed    Spouse name: Not on file   Number of children: 4   Years of education: 55   Highest education level: Some college, no degree  Occupational History   Occupation: retired    Associate Professor: [employer]  Tobacco Use   Smoking status: Former    Current packs/day: 0.00    Average packs/day: 2.0 packs/day for 20.0 years (40.0 ttl pk-yrs)    Types: Cigarettes    Start date: 07/05/1954    Quit date: 07/05/1974    Years since quitting: 48.7   Smokeless tobacco: Never  Vaping Use  Vaping status: Never Used  Substance and Sexual Activity   Alcohol use: Not Currently   Drug use: No   Sexual activity: Not Currently  Other Topics Concern   Not on file  Social History Narrative   Recently widowed   Former Smoker    Alcohol use-yes  1-2 drinks per day      Occupation: Retired Associate Professor      Originally from [place], [place] - in [place] > 10 yrs as of 2017      Social Determinants of Health   Financial Resource Strain: Low Risk  (07/28/2021)   Overall Financial Resource Strain (CARDIA)    Difficulty of Paying Living Expenses: Not hard at all  Food Insecurity: No Food Insecurity (01/14/2023)   Hunger Vital Sign    Worried About Running Out of Food in the Last Year: Never true    Ran Out of Food in the Last Year: Never true  Transportation Needs: No Transportation Needs (01/14/2023)   PRAPARE - Administrator, Civil Service (Medical): No    Lack of Transportation (Non-Medical): No  Physical Activity: Inactive (07/28/2021)   Exercise Vital Sign    Days of Exercise per Week: 0 days    Minutes of Exercise per Session: 0 min  Stress: No Stress Concern Present (07/28/2021)   [employer] of Occupational Health - Occupational Stress Questionnaire    Feeling of Stress : Not at all  Social Connections: Moderately Isolated (08/04/2020)   Social Connection and Isolation Panel [NHANES]    Frequency of Communication with Friends and Family: Three times a week    Frequency of Social Gatherings with Friends and Family: More than three times a week    Attends Religious Services: More than 4 times per year    Active Member of Clubs or Organizations: No    Attends Banker Meetings: Never    Marital Status: Widowed  Intimate Partner Violence: Not At Risk (01/10/2023)   Humiliation, Afraid, Rape, and Kick questionnaire    Fear of Current or Ex-Partner: No    Emotionally Abused: No    Physically Abused: No    Sexually Abused: No    Past Surgical History:  Procedure Laterality Date   BILIARY BRUSHING  12/13/2022   Procedure: BILIARY BRUSHING;  Surgeon: Iva Boop, MD;  Location: [facility] ENDOSCOPY;  Service: Gastroenterology;;   BILIARY STENT PLACEMENT  12/13/2022   Procedure: BILIARY STENT PLACEMENT;  Surgeon:  Iva Boop, MD;  Location: [facility] ENDOSCOPY;  Service: Gastroenterology;;   BILIARY STENT PLACEMENT N/A 02/07/2023   Procedure: BILIARY STENT PLACEMENT;  Surgeon: Lemar Lofty., MD;  Location: [facility] ENDOSCOPY;  Service: Gastroenterology;  Laterality: N/A;   BIOPSY  12/23/2022   Procedure: BIOPSY;  Surgeon: Lemar Lofty., MD;  Location: [facility] ENDOSCOPY;  Service: Gastroenterology;;   BRONCHIAL WASHINGS  10/30/2019   Procedure: BRONCHIAL WASHINGS;  Surgeon: [name], DO;  Location: [facility] ENDOSCOPY;  Service: Endoscopy;;   CARDIAC CATHETERIZATION     CATARACT EXTRACTION W/ INTRAOCULAR LENS  IMPLANT, BILATERAL Bilateral    COLONOSCOPY     ENDOSCOPIC RETROGRADE CHOLANGIOPANCREATOGRAPHY (ERCP) WITH PROPOFOL N/A 02/07/2023   Procedure: ENDOSCOPIC RETROGRADE CHOLANGIOPANCREATOGRAPHY (ERCP) WITH PROPOFOL;  Surgeon: Lemar Lofty., MD;  Location: [facility] ENDOSCOPY;  Service: Gastroenterology;  Laterality: N/A;  Stent change   ERCP N/A 12/13/2022   Procedure: ENDOSCOPIC RETROGRADE CHOLANGIOPANCREATOGRAPHY (ERCP);  Surgeon: Iva Boop, MD;  Location: [facility] ENDOSCOPY;  Service: Gastroenterology;  Laterality: N/A;  with stent  placement   ESOPHAGOGASTRODUODENOSCOPY     ESOPHAGOGASTRODUODENOSCOPY (EGD) WITH PROPOFOL N/A 12/23/2022   Procedure: ESOPHAGOGASTRODUODENOSCOPY (EGD) WITH PROPOFOL;  Surgeon: Meridee Score Netty Starring., MD;  Location: [facility] ENDOSCOPY;  Service: Gastroenterology;  Laterality: N/A;   EUS N/A 12/23/2022   Procedure: UPPER ENDOSCOPIC ULTRASOUND (EUS) RADIAL;  Surgeon: Lemar Lofty., MD;  Location: [facility] ENDOSCOPY;  Service: Gastroenterology;  Laterality: N/A;   FINE NEEDLE ASPIRATION  12/23/2022   Procedure: FINE NEEDLE ASPIRATION (FNA) RADIAL;  Surgeon: Lemar Lofty., MD;  Location: [facility] ENDOSCOPY;  Service: Gastroenterology;;   IR IMAGING GUIDED PORT INSERTION  01/04/2023   KNEE ARTHROSCOPY Right 2007   KNEE ARTHROSCOPY  07/13/2011   Procedure: ARTHROSCOPY KNEE;   Surgeon: Erasmo Leventhal;  Location: [facility];  Service: Orthopedics;  Laterality: Left;  partial menisectomy with chondrylplasty   KNEE SURGERY Left    LAMINECTOMY AND MICRODISCECTOMY LUMBAR SPINE  MARCH  2008   L3 -  4   LAPAROSCOPIC INCISIONAL / UMBILICAL / VENTRAL HERNIA REPAIR  2006   PACEMAKER IMPLANT N/A 08/02/2018   Procedure: PACEMAKER IMPLANT;  Surgeon: Marinus Maw, MD;  Location: [facility];  Service: Cardiovascular;  Laterality: N/A;   PROSTATE BIOPSY     RADIOACTIVE SEED IMPLANT N/A 02/12/2019   Procedure: RADIOACTIVE SEED IMPLANT/BRACHYTHERAPY IMPLANT;  Surgeon: Marcine Matar, MD;  Location: [facility] ORS;  Service: Urology;  Laterality: N/A;  90 MINS   REMOVAL OF STONES  02/07/2023   Procedure: REMOVAL OF Sludge;  Surgeon: Meridee Score Netty Starring., MD;  Location: [facility] ENDOSCOPY;  Service: Gastroenterology;;   SHOULDER ARTHROSCOPY Right 05-23-2007   SIGMOID COLECTOMY FOR CANCER  1989   SPACE OAR INSTILLATION N/A 02/12/2019   Procedure: SPACE OAR INSTILLATION;  Surgeon: Marcine Matar, MD;  Location: [facility] ORS;  Service: Urology;  Laterality: N/A;   SPHINCTEROTOMY  12/13/2022   Procedure: SPHINCTEROTOMY;  Surgeon: Iva Boop, MD;  Location: [facility] ENDOSCOPY;  Service: Gastroenterology;;   Francine Graven REMOVAL  02/07/2023   Procedure: STENT REMOVAL;  Surgeon: Lemar Lofty., MD;  Location: [facility] ENDOSCOPY;  Service: Gastroenterology;;   TOTAL KNEE ARTHROPLASTY Left 05/23/2015   Procedure: LEFT TOTAL KNEE ARTHROPLASTY;  Surgeon: Eugenia Mcalpine, MD;  Location: [facility] ORS;  Service: Orthopedics;  Laterality: Left;   VERTICAL BANDED GASTROPLASTY  1986   VIDEO BRONCHOSCOPY N/A 10/30/2019   Procedure: VIDEO BRONCHOSCOPY WITH BRONICAL ALVEROLAR LAVAGE WITHOUT FLUORO;  Surgeon: [name], DO;  Location: [facility] ENDOSCOPY;  Service: Endoscopy;  Laterality: N/A;    Family History  Problem Relation Age of Onset   Stroke Paternal Grandfather    Heart disease Paternal  Grandfather    Esophagitis Father        died from perforated esophagus   Breast cancer Mother    Colon cancer Maternal Grandmother        questionable   Esophageal cancer Neg Hx    Prostate cancer Neg Hx    Stomach cancer Neg Hx     No Known Allergies  Current Outpatient Medications on File Prior to Visit  Medication Sig Dispense Refill   acetaminophen (TYLENOL) 325 MG tablet Take 2 tablets (650 mg total) by mouth every 6 (six) hours as needed for mild pain (or Fever >/= 101). 20 tablet 0   allopurinol (ZYLOPRIM) 300 MG tablet Take 1 tablet (300 mg total) by mouth daily. 90 tablet 3   amLODipine (NORVASC) 10 MG tablet Take 1 tablet (10 mg total) by mouth daily. 90 tablet 3  apixaban (ELIQUIS) 5 MG TABS tablet Take 1 tablet (5 mg total) by mouth 2 (two) times daily. 180 tablet 1   Calcium Carb-Cholecalciferol (CALCIUM 600 + D PO) Take 1 tablet by mouth daily. 800 units of vitamin D     docusate sodium (COLACE) 100 MG capsule Take 1 capsule (100 mg total) by mouth 2 (two) times daily. 180 capsule 1   doxycycline (VIBRAMYCIN) 100 MG capsule Take 1 capsule (100 mg total) by mouth 2 (two) times daily for 14 days. 28 capsule 0   esomeprazole (NEXIUM) 40 MG capsule Take 1 capsule (40 mg total) by mouth 2 (two) times daily before a meal. 60 capsule 12   Eszopiclone 3 MG TABS Take 1 tablet (3 mg total) by mouth at bedtime. Take immediately before bedtime 30 tablet 2   guaiFENesin (MUCINEX) 600 MG 12 hr tablet Take 600 mg by mouth 2 (two) times daily as needed for cough.     ipratropium (ATROVENT) 0.03 % nasal spray Place 2 sprays into both nostrils every 12 (twelve) hours. 30 mL 1   lidocaine-prilocaine (EMLA) cream Apply 1 Application topically as needed. 30 g 0   Multiple Vitamin (MULTIVITAMIN) tablet Take 1 tablet by mouth in the morning.     ondansetron (ZOFRAN) 8 MG tablet Take 1 tablet (8 mg total) by mouth every 8 (eight) hours as needed. 20 tablet 0   oxybutynin (DITROPAN) 5 MG tablet  Take 2.5 mg by mouth 2 (two) times daily.     polyethylene glycol powder (GLYCOLAX/MIRALAX) 17 GM/SCOOP powder Take 17g (1 capful) by mouth as directed daily as needed for mild constipation. 238 g 0   potassium chloride (KLOR-CON M) 10 MEQ tablet Take 1 tablet (10 mEq total) by mouth 2 (two) times daily. 60 tablet 1   prochlorperazine (COMPAZINE) 10 MG tablet Take 1 tablet (10 mg total) by mouth every 6 (six) hours as needed for nausea or vomiting 30 tablet 1   Respiratory Therapy Supplies (FLUTTER) DEVI 1 each by Does not apply route in the morning and at bedtime. 1 each 0   sodium chloride HYPERTONIC 3 % nebulizer solution Take by nebulization as needed for other. 750 mL 1   sucralfate (CARAFATE) 1 GM/10ML suspension Take 10 mLs (1 g total) by mouth 2 (two) times daily. 420 mL 2   tadalafil (CIALIS) 20 MG tablet Take 0.5-1 tablets (10-20 mg total) by mouth daily as needed 1-2 hours before planned activity. 20 tablet 11   tamsulosin (FLOMAX) 0.4 MG CAPS capsule Take 0.4 mg by mouth daily.     torsemide (DEMADEX) 20 MG tablet Take 1 tablet (20 mg total) by mouth daily. 30 tablet 2   valsartan-hydrochlorothiazide (DIOVAN-HCT) 320-25 MG tablet Take 1 tablet by mouth daily. 90 tablet 3   Vibegron (GEMTESA) 75 MG TABS Take 1 tablet (75 mg total) by mouth daily. 90 tablet 3   No current facility-administered medications on file prior to visit.    BP 138/60   Pulse (!) 48   Temp 97.7 F (36.5 C) (Oral)   Ht 5\' 8"  (1.727 m)   Wt 215 lb (97.5 kg)   SpO2 99%   BMI 32.69 kg/m       Objective:   Physical Exam Vitals and nursing note reviewed.  Constitutional:      Appearance: Normal appearance.  Skin:    General: Skin is warm and dry.     Findings: No erythema.     Comments: He still has +1  pitting edema noted to the dorsal aspect of his right wrist.  No redness or warmth noted.  Neurological:     General: No focal deficit present.     Mental Status: He is oriented to person, place, and  time.  Psychiatric:        Mood and Affect: Mood normal.        Behavior: Behavior normal.        Thought Content: Thought content normal.        Judgment: Judgment normal.       Assessment & Plan:  1. Cellulitis of right upper extremity -He does have continued cellulitic features but these are improving.  Continue with doxycycline until finished.  Can elevate arm on a few pillows while at rest to help alleviate some of the edema.  Follow-up if symptoms worsen or if he develops fever or chills  [name], NP

## 2023-03-26 ENCOUNTER — Other Ambulatory Visit: Payer: Self-pay | Admitting: Oncology

## 2023-03-28 ENCOUNTER — Inpatient Hospital Stay: Payer: No Typology Code available for payment source

## 2023-03-28 ENCOUNTER — Ambulatory Visit (HOSPITAL_BASED_OUTPATIENT_CLINIC_OR_DEPARTMENT_OTHER)
Admission: RE | Admit: 2023-03-28 | Discharge: 2023-03-28 | Disposition: A | Discharge status: Home / Self Care | Payer: No Typology Code available for payment source | Source: Ambulatory Visit | Attending: Oncology | Admitting: Oncology

## 2023-03-28 DIAGNOSIS — K4020 Bilateral inguinal hernia, without obstruction or gangrene, not specified as recurrent: Secondary | ICD-10-CM | POA: Diagnosis not present

## 2023-03-28 DIAGNOSIS — K8020 Calculus of gallbladder without cholecystitis without obstruction: Secondary | ICD-10-CM | POA: Diagnosis not present

## 2023-03-28 DIAGNOSIS — C259 Malignant neoplasm of pancreas, unspecified: Secondary | ICD-10-CM | POA: Diagnosis not present

## 2023-03-28 MED ORDER — IOHEXOL 300 MG/ML  SOLN
100.0000 mL | Freq: Once | INTRAMUSCULAR | Status: AC | PRN
  Administered 2023-03-28: 85 mL via INTRAVENOUS

## 2023-03-28 MED ORDER — HEPARIN SOD (PORK) LOCK FLUSH 100 UNIT/ML IV SOLN
500.0000 [IU] | Freq: Once | INTRAVENOUS | Status: AC
  Administered 2023-03-28: 500 [IU] via INTRAVENOUS

## 2023-03-30 ENCOUNTER — Other Ambulatory Visit (HOSPITAL_BASED_OUTPATIENT_CLINIC_OR_DEPARTMENT_OTHER): Payer: Self-pay

## 2023-03-30 ENCOUNTER — Encounter: Payer: Self-pay | Admitting: Oncology

## 2023-03-31 ENCOUNTER — Other Ambulatory Visit (HOSPITAL_BASED_OUTPATIENT_CLINIC_OR_DEPARTMENT_OTHER): Payer: Self-pay

## 2023-04-01 ENCOUNTER — Inpatient Hospital Stay: Payer: No Typology Code available for payment source

## 2023-04-01 ENCOUNTER — Inpatient Hospital Stay (HOSPITAL_BASED_OUTPATIENT_CLINIC_OR_DEPARTMENT_OTHER): Payer: No Typology Code available for payment source | Admitting: Nurse Practitioner

## 2023-04-01 ENCOUNTER — Encounter: Payer: Self-pay | Admitting: Nurse Practitioner

## 2023-04-01 ENCOUNTER — Telehealth: Payer: Self-pay

## 2023-04-01 VITALS — BP 170/81 | HR 60 | Resp 18

## 2023-04-01 VITALS — BP 147/72 | HR 59 | Temp 98.2°F | Resp 18 | Ht 68.0 in | Wt 217.3 lb

## 2023-04-01 DIAGNOSIS — Z5111 Encounter for antineoplastic chemotherapy: Secondary | ICD-10-CM | POA: Diagnosis not present

## 2023-04-01 DIAGNOSIS — C259 Malignant neoplasm of pancreas, unspecified: Secondary | ICD-10-CM

## 2023-04-01 LAB — CMP (CANCER CENTER ONLY)
ALT: 15 U/L (ref 0–44)
AST: 18 U/L (ref 15–41)
Albumin: 3.3 g/dL — ABNORMAL LOW (ref 3.5–5.0)
Alkaline Phosphatase: 65 U/L (ref 38–126)
Anion gap: 6 (ref 5–15)
BUN: 24 mg/dL — ABNORMAL HIGH (ref 8–23)
CO2: 25 mmol/L (ref 22–32)
Calcium: 8.8 mg/dL — ABNORMAL LOW (ref 8.9–10.3)
Chloride: 110 mmol/L (ref 98–111)
Creatinine: 1.01 mg/dL (ref 0.61–1.24)
GFR, Estimated: >60 mL/min (ref >60)
Glucose, Bld: 114 mg/dL — ABNORMAL HIGH (ref 70–99)
Potassium: 3.7 mmol/L (ref 3.5–5.1)
Sodium: 141 mmol/L (ref 135–145)
Total Bilirubin: 0.5 mg/dL (ref 0.3–1.2)
Total Protein: 6 g/dL — ABNORMAL LOW (ref 6.5–8.1)

## 2023-04-01 LAB — CBC WITH DIFFERENTIAL (CANCER CENTER ONLY)
Abs Immature Granulocytes: 0.04 10*3/uL (ref 0.00–0.07)
Basophils Absolute: 0 10*3/uL (ref 0.0–0.1)
Basophils Relative: 1 %
Eosinophils Absolute: 0.1 10*3/uL (ref 0.0–0.5)
Eosinophils Relative: 2 %
HCT: 28.7 % — ABNORMAL LOW (ref 39.0–52.0)
Hemoglobin: 9 g/dL — ABNORMAL LOW (ref 13.0–17.0)
Immature Granulocytes: 1 %
Lymphocytes Relative: 27 %
Lymphs Abs: 1.2 10*3/uL (ref 0.7–4.0)
MCH: 27.3 pg (ref 26.0–34.0)
MCHC: 31.4 g/dL (ref 30.0–36.0)
MCV: 87 fL (ref 80.0–100.0)
Monocytes Absolute: 0.5 10*3/uL (ref 0.1–1.0)
Monocytes Relative: 11 %
Neutro Abs: 2.5 10*3/uL (ref 1.7–7.7)
Neutrophils Relative %: 58 %
Platelet Count: 225 10*3/uL (ref 150–400)
RBC: 3.3 MIL/uL — ABNORMAL LOW (ref 4.22–5.81)
RDW: 18.1 % — ABNORMAL HIGH (ref 11.5–15.5)
WBC Count: 4.4 10*3/uL (ref 4.0–10.5)
nRBC: 0 % (ref 0.0–0.2)

## 2023-04-01 MED ORDER — SODIUM CHLORIDE 0.9 % IV SOLN
800.0000 mg/m2 | Freq: Once | INTRAVENOUS | Status: AC
  Administered 2023-04-01: 1710 mg via INTRAVENOUS
  Filled 2023-04-01: qty 25.98

## 2023-04-01 MED ORDER — PACLITAXEL PROTEIN-BOUND CHEMO INJECTION 100 MG
100.0000 mg/m2 | Freq: Once | INTRAVENOUS | Status: AC
  Administered 2023-04-01: 200 mg via INTRAVENOUS
  Filled 2023-04-01: qty 40

## 2023-04-01 MED ORDER — SODIUM CHLORIDE 0.9% FLUSH
10.0000 mL | INTRAVENOUS | Status: DC | PRN
  Administered 2023-04-01: 10 mL

## 2023-04-01 MED ORDER — SODIUM CHLORIDE 0.9 % IV SOLN: Freq: Once | INTRAVENOUS | Status: AC

## 2023-04-01 MED ORDER — PROCHLORPERAZINE MALEATE 10 MG PO TABS
10.0000 mg | ORAL_TABLET | Freq: Once | ORAL | Status: AC
  Administered 2023-04-01: 10 mg via ORAL
  Filled 2023-04-01: qty 1

## 2023-04-01 MED ORDER — HEPARIN SOD (PORK) LOCK FLUSH 100 UNIT/ML IV SOLN
500.0000 [IU] | Freq: Once | INTRAVENOUS | Status: AC | PRN
  Administered 2023-04-01: 500 [IU]

## 2023-04-01 NOTE — Progress Notes (Signed)
Patient seen by Lonna Cobb NP today  Vitals are within treatment parameters:Yes   Labs are within treatment parameters: Yes   Treatment plan has been signed: Yes   Per physician team, Patient is ready for treatment and there are NO modifications to the treatment plan.

## 2023-04-01 NOTE — Telephone Encounter (Signed)
I contacted the reading room regarding the request addendum, and the correct dates were provided for the comparisons.

## 2023-04-01 NOTE — Progress Notes (Signed)
[facility] OFFICE PROGRESS NOTE   Diagnosis: Pancreas cancer  INTERVAL HISTORY:   Marcus [name] returns as scheduled.  He completed cycle 6 gemcitabine/Abraxane 03/17/2023.  He denies nausea/vomiting.  No diarrhea.  No fever or rash after treatment.  He reports a good appetite.  Describes energy level as "fair".  No neuropathy symptoms.  No cough or shortness of breath.  He was seen in the emergency department 03/24/2023 with pain and swelling right wrist extending to the dorsum of the hand and fingers.  Erythema noted on exam.  He completed a course of doxycycline.  He was seen in the emergency department 03/10/2023 for similar symptoms.  Objective:  Vital signs in last 24 hours:  Blood pressure (!) 147/72, pulse (!) 59, temperature 98.2 F (36.8 C), temperature source Oral, resp. rate 18, height 5\' 8"  (1.727 m), weight 217 lb 4.8 oz (98.6 kg), SpO2 100%.    HEENT: No thrush or ulcers. Resp: Lungs clear bilaterally. Cardio: Regular rate and rhythm. GI: Abdomen soft and nontender.  No hepatosplenomegaly. Vascular: Trace lower leg edema bilaterally. Skin: No rash. Musculoskeletal: Right wrist/dorsum of the hand with edema, tenderness. The cath without erythema.  Lab Results:  Lab Results  Component Value Date   WBC 4.4 04/01/2023   HGB 9.0 (L) 04/01/2023   HCT 28.7 (L) 04/01/2023   MCV 87.0 04/01/2023   PLT 225 04/01/2023   NEUTROABS 2.5 04/01/2023    Imaging:  No results found.  Medications: I have reviewed the patient's current medications.  Assessment/Plan: Pancreas cancer CT abdomen/pelvis 12/03/2022-suspected pancreas mass in the proximal body measuring 2 cm with probable mass effect on the superior aspect of the SMV, multiple hypodense liver lesions, many are new from a remote CT-indeterminate, 2 cystic lesions in the pancreas tail-nonspecific prior bariatric surgery ERCP 12/13/2022-malignant appearing common bile duct stricture, plastic stent placed,  brushing cytology-no malignant cells EUS 12/23/2022-30 x 33 mm pancreas head mass with invasion of the SMV, no malignant appearing lymph nodes, 4 cysts in the visualized liver, FNA-adenocarcinoma Cycle 1 gemcitabine and abraxane 01/07/2023 CTs 01/09/2023-multiple low-attenuation liver lesions unchanged, largest of which are clearly benign fluid attenuation cyst, others more ill-defined and incompletely characterized suspicious for small metastases.  Similar appearance of hypodense mass in the pancreatic head.  Distended gallbladder with mild gallbladder wall thickening and adjacent pericholecystic fat stranding.  Sludge in the gallbladder fundus.  Common bile duct stent in place with diminished, persistent intrahepatic biliary ductal dilatation.  Negative for pulmonary embolism.  Acute appearing heterogeneous airspace opacity of the superior segment left lower lobe consistent with infection or aspiration. Cycle 2 gemcitabine and abraxane 01/21/2023, gemcitabine dose reduced due to neutropenia following cycle 1 Cycle 3 gemcitabine/Abraxane 02/04/2023 Cycle 4 gemcitabine/Abraxane 02/17/2023 Cycle 5 gemcitabine/Abraxane 03/04/2023 Cycle 6 gemcitabine/Abraxane 03/17/2023 Cycle 7 gemcitabine/Abraxane 04/01/2023 Biliary obstruction secondary to 1-status post ERCP/stent placement 12/13/2022 Coronary artery disease Atrial fibrillation Gout Sleep apnea Status post bariatric surgery Hypertension Bronchiectasis Gastroesophageal reflux disease History of colon cancer in his 40s Complete heart block-pacemaker placed Admission 01/09/2023 with a fever and failure to thrive, CT chest with evidence of left lung pneumonia, CT abdomen/pelvis with a distended gallbladder and adjacent fat stranding Anemia  Disposition: Marcus [name] appears stable.  He has completed 6 cycles of gemcitabine/Abraxane.  Restaging CTs show stable pancreatic head mass, no evidence of metastatic disease; multiple small hypoattenuating lesions in the  liver as noted on CT 02/23/2023.  Most recent CA 19-9 tumor marker was further improved.  Plan to  continue gemcitabine/Abraxane on a 2-week schedule.  We will request comparison of the recent CTs to CTs completed 01/09/2023 and 12/03/2022.  The redness, pain and swelling at the right wrist could be related to the Gemcitabine.  He will contact the office if symptoms recur.  He will return for follow-up and treatment in 2 weeks.  He will contact the office in the interim as outlined above or with any other problems.  Patient seen with Dr. Truett Perna.  CT report and images reviewed with Marcus [name].    Lonna Cobb ANP/GNP-BC   04/01/2023  10:04 AM  This was a shared visit with Lonna Cobb.  We reviewed the CT findings and images with Marcus [name].  There is no CT evidence of disease progression.  The CA 19-9 is lower.  I recommend continuing gemcitabine/Abraxane.  He will contact us for recurrent swelling or erythema at the right wrist.  This is likely related to gemcitabine.  I was present for greater than 50% of today's visit.  I performed Medical Decision Making.  Mancel Bale, MD

## 2023-04-01 NOTE — Telephone Encounter (Signed)
-----   Message from Lonna Cobb sent at 04/01/2023  1:25 PM EDT ----- Please ask radiology to compare the CT scan from 03/27/2022 to CT scans from 01/08/2022 and 12/02/2021 and issue an addendum.  Dr. Truett Perna would like direct comparison of the liver lesions.

## 2023-04-01 NOTE — Patient Instructions (Signed)
[facility]   Discharge Instructions: Thank you for choosing [facility] to provide your oncology and hematology care.   If you have a lab appointment with the Cancer Center, please go directly to the Cancer Center and check in at the registration area.   Wear comfortable clothing and clothing appropriate for easy access to any Portacath or PICC line.   We strive to give you quality time with your provider. You may need to reschedule your appointment if you arrive late (15 or more minutes).  Arriving late affects you and other patients whose appointments are after yours.  Also, if you miss three or more appointments without notifying the office, you may be dismissed from the clinic at the provider's discretion.      For prescription refill requests, have your pharmacy contact our office and allow 72 hours for refills to be completed.    Today you received the following chemotherapy and/or immunotherapy agents Paclitaxel-protein bound (ABRAXANE) & Gemcitabine (GEMZAR).   To help prevent nausea and vomiting after your treatment, we encourage you to take your nausea medication as directed.  BELOW ARE SYMPTOMS THAT SHOULD BE REPORTED IMMEDIATELY: *FEVER GREATER THAN 100.4 F (38 C) OR HIGHER *CHILLS OR SWEATING *NAUSEA AND VOMITING THAT IS NOT CONTROLLED WITH YOUR NAUSEA MEDICATION *UNUSUAL SHORTNESS OF BREATH *UNUSUAL BRUISING OR BLEEDING *URINARY PROBLEMS (pain or burning when urinating, or frequent urination) *BOWEL PROBLEMS (unusual diarrhea, constipation, pain near the anus) TENDERNESS IN MOUTH AND THROAT WITH OR WITHOUT PRESENCE OF ULCERS (sore throat, sores in mouth, or a toothache) UNUSUAL RASH, SWELLING OR PAIN  UNUSUAL VAGINAL DISCHARGE OR ITCHING   Items with * indicate a potential emergency and should be followed up as soon as possible or go to the Emergency Department if any problems should occur.  Please show the CHEMOTHERAPY  ALERT CARD or IMMUNOTHERAPY ALERT CARD at check-in to the Emergency Department and triage nurse.  Should you have questions after your visit or need to cancel or reschedule your appointment, please contact [facility]  Dept: [phone]  and follow the prompts.  Office hours are 8:00 a.m. to 4:30 p.m. Monday - Friday. Please note that voicemails left after 4:00 p.m. may not be returned until the following business day.  We are closed weekends and major holidays. You have access to a nurse at all times for urgent questions. Please call the main number to the clinic Dept: [phone] and follow the prompts.   For any non-urgent questions, you may also contact your provider using MyChart. We now offer e-Visits for anyone 18 and older to request care online for non-urgent symptoms. For details visit [email or web address].   Also download the MyChart app! Go to the app store, search "MyChart", open the app, select [facility], and log in with your MyChart username and password.  Paclitaxel Nanoparticle Albumin-Bound Injection What is this medication? NANOPARTICLE ALBUMIN-BOUND PACLITAXEL (Na no PAHR ti kuhl al BYOO muhn-bound PAK li TAX el) treats some types of cancer. It works by slowing down the growth of cancer cells. This medicine may be used for other purposes; ask your health care provider or pharmacist if you have questions. COMMON BRAND NAME(S): Abraxane What should I tell my care team before I take this medication? They need to know if you have any of these conditions: Liver disease Low white blood cell levels An unusual or allergic reaction to paclitaxel, albumin, other medications, foods, dyes, or preservatives   If you or your partner are pregnant or trying to get pregnant Breast-feeding How should I use this medication? This medication is injected into a vein. It is given by your care team in a hospital or clinic setting. Talk to your care team  about the use of this medication in children. Special care may be needed. Overdosage: If you think you have taken too much of this medicine contact a poison control center or emergency room at once. NOTE: This medicine is only for you. Do not share this medicine with others. What if I miss a dose? Keep appointments for follow-up doses. It is important not to miss your dose. Call your care team if you are unable to keep an appointment. What may interact with this medication? Other medications may affect the way this medication works. Talk with your care team about all of the medications you take. They may suggest changes to your treatment plan to lower the risk of side effects and to make sure your medications work as intended. This list may not describe all possible interactions. Give your health care provider a list of all the medicines, herbs, non-prescription drugs, or dietary supplements you use. Also tell them if you smoke, drink alcohol, or use illegal drugs. Some items may interact with your medicine. What should I watch for while using this medication? Your condition will be monitored carefully while you are receiving this medication. You may need blood work while taking this medication. This medication may make you feel generally unwell. This is not uncommon as chemotherapy can affect healthy cells as well as cancer cells. Report any side effects. Continue your course of treatment even though you feel ill unless your care team tells you to stop. This medication can cause serious allergic reactions. To reduce the risk, your care team may give you other medications to take before receiving this one. Be sure to follow the directions from your care team. This medication may increase your risk of getting an infection. Call your care team for advice if you get a fever, chills, sore throat, or other symptoms of a cold or flu. Do not treat yourself. Try to avoid being around people who are sick. This  medication may increase your risk to bruise or bleed. Call your care team if you notice any unusual bleeding. Be careful brushing or flossing your teeth or using a toothpick because you may get an infection or bleed more easily. If you have any dental work done, tell your dentist you are receiving this medication. Talk to your care team if you or your partner may be pregnant. Serious birth defects can occur if you take this medication during pregnancy and for 6 months after the last dose. You will need a negative pregnancy test before starting this medication. Contraception is recommended while taking this medication and for 6 months after the last dose. Your care team can help you find the option that works for you. If your partner can get pregnant, use a condom during sex while taking this medication and for 3 months after the last dose. Do not breastfeed while taking this medication and for 2 weeks after the last dose. This medication may cause infertility. Talk to your care team if you are concerned about your fertility. What side effects may I notice from receiving this medication? Side effects that you should report to your care team as soon as possible: Allergic reactions--skin rash, itching, hives, swelling of the face, lips, tongue, or throat Dry cough,   shortness of breath or trouble breathing Infection--fever, chills, cough, sore throat, wounds that don't heal, pain or trouble when passing urine, general feeling of discomfort or being unwell Low red blood cell level--unusual weakness or fatigue, dizziness, headache, trouble breathing Pain, tingling, or numbness in the hands or feet Stomach pain, unusual weakness or fatigue, nausea, vomiting, diarrhea, or fever that lasts longer than expected Unusual bruising or bleeding Side effects that usually do not require medical attention (report to your care team if they continue or are bothersome): Diarrhea Fatigue Hair loss Loss of  appetite Nausea Vomiting This list may not describe all possible side effects. Call your doctor for medical advice about side effects. You may report side effects to FDA at 1-800-FDA-1088. Where should I keep my medication? This medication is given in a hospital or clinic. It will not be stored at home. NOTE: This sheet is a summary. It may not cover all possible information. If you have questions about this medicine, talk to your doctor, pharmacist, or health care provider.  2024 Elsevier/Gold Standard (2021-11-05 00:00:00)  Gemcitabine Injection What is this medication? GEMCITABINE (jem SYE ta been) treats some types of cancer. It works by slowing down the growth of cancer cells. This medicine may be used for other purposes; ask your health care provider or pharmacist if you have questions. COMMON BRAND NAME(S): Gemzar, Infugem What should I tell my care team before I take this medication? They need to know if you have any of these conditions: Blood disorders Infection Kidney disease Liver disease Lung or breathing disease, such as asthma or COPD Recent or ongoing radiation therapy An unusual or allergic reaction to gemcitabine, other medications, foods, dyes, or preservatives If you or your partner are pregnant or trying to get pregnant Breast-feeding How should I use this medication? This medication is injected into a vein. It is given by your care team in a hospital or clinic setting. Talk to your care team about the use of this medication in children. Special care may be needed. Overdosage: If you think you have taken too much of this medicine contact a poison control center or emergency room at once. NOTE: This medicine is only for you. Do not share this medicine with others. What if I miss a dose? Keep appointments for follow-up doses. It is important not to miss your dose. Call your care team if you are unable to keep an appointment. What may interact with this  medication? Interactions have not been studied. This list may not describe all possible interactions. Give your health care provider a list of all the medicines, herbs, non-prescription drugs, or dietary supplements you use. Also tell them if you smoke, drink alcohol, or use illegal drugs. Some items may interact with your medicine. What should I watch for while using this medication? Your condition will be monitored carefully while you are receiving this medication. This medication may make you feel generally unwell. This is not uncommon, as chemotherapy can affect healthy cells as well as cancer cells. Report any side effects. Continue your course of treatment even though you feel ill unless your care team tells you to stop. In some cases, you may be given additional medications to help with side effects. Follow all directions for their use. This medication may increase your risk of getting an infection. Call your care team for advice if you get a fever, chills, sore throat, or other symptoms of a cold or flu. Do not treat yourself. Try to avoid being around   people who are sick. This medication may increase your risk to bruise or bleed. Call your care team if you notice any unusual bleeding. Be careful brushing or flossing your teeth or using a toothpick because you may get an infection or bleed more easily. If you have any dental work done, tell your dentist you are receiving this medication. Avoid taking medications that contain aspirin, acetaminophen, ibuprofen, naproxen, or ketoprofen unless instructed by your care team. These medications may hide a fever. Talk to your care team if you or your partner wish to become pregnant or think you might be pregnant. This medication can cause serious birth defects if taken during pregnancy and for 6 months after the last dose. A negative pregnancy test is required before starting this medication. A reliable form of contraception is recommended while taking  this medication and for 6 months after the last dose. Talk to your care team about effective forms of contraception. Do not father a child while taking this medication and for 3 months after the last dose. Use a condom while having sex during this time period. Do not breastfeed while taking this medication and for at least 1 week after the last dose. This medication may cause infertility. Talk to your care team if you are concerned about your fertility. What side effects may I notice from receiving this medication? Side effects that you should report to your care team as soon as possible: Allergic reactions--skin rash, itching, hives, swelling of the face, lips, tongue, or throat Capillary leak syndrome--stomach or muscle pain, unusual weakness or fatigue, feeling faint or lightheaded, decrease in the amount of urine, swelling of the ankles, hands, or feet, trouble breathing Infection--fever, chills, cough, sore throat, wounds that don't heal, pain or trouble when passing urine, general feeling of discomfort or being unwell Liver injury--right upper belly pain, loss of appetite, nausea, light-colored stool, dark yellow or brown urine, yellowing skin or eyes, unusual weakness or fatigue Low red blood cell level--unusual weakness or fatigue, dizziness, headache, trouble breathing Lung injury--shortness of breath or trouble breathing, cough, spitting up blood, chest pain, fever Stomach pain, bloody diarrhea, pale skin, unusual weakness or fatigue, decrease in the amount of urine, which may be signs of hemolytic uremic syndrome Sudden and severe headache, confusion, change in vision, seizures, which may be signs of posterior reversible encephalopathy syndrome (PRES) Unusual bruising or bleeding Side effects that usually do not require medical attention (report to your care team if they continue or are bothersome): Diarrhea Drowsiness Hair loss Nausea Pain, redness, or swelling with sores inside the  mouth or throat Vomiting This list may not describe all possible side effects. Call your doctor for medical advice about side effects. You may report side effects to FDA at 1-800-FDA-1088. Where should I keep my medication? This medication is given in a hospital or clinic. It will not be stored at home. NOTE: This sheet is a summary. It may not cover all possible information. If you have questions about this medicine, talk to your doctor, pharmacist, or health care provider.  2024 Elsevier/Gold Standard (2021-10-27 00:00:00)  

## 2023-04-02 LAB — CANCER ANTIGEN 19-9: CA 19-9: 574 U/mL — ABNORMAL HIGH (ref 0–35)

## 2023-04-04 ENCOUNTER — Other Ambulatory Visit (HOSPITAL_BASED_OUTPATIENT_CLINIC_OR_DEPARTMENT_OTHER): Payer: Self-pay

## 2023-04-05 ENCOUNTER — Ambulatory Visit (INDEPENDENT_AMBULATORY_CARE_PROVIDER_SITE_OTHER): Payer: Non-veteran care | Admitting: Adult Health

## 2023-04-05 ENCOUNTER — Encounter: Payer: Self-pay | Admitting: Oncology

## 2023-04-05 ENCOUNTER — Other Ambulatory Visit (HOSPITAL_BASED_OUTPATIENT_CLINIC_OR_DEPARTMENT_OTHER): Payer: Self-pay

## 2023-04-05 ENCOUNTER — Encounter: Payer: Self-pay | Admitting: Adult Health

## 2023-04-05 ENCOUNTER — Other Ambulatory Visit: Payer: Self-pay

## 2023-04-05 VITALS — BP 150/80 | HR 55 | Temp 97.8°F | Ht 68.0 in | Wt 214.0 lb

## 2023-04-05 DIAGNOSIS — I10 Essential (primary) hypertension: Secondary | ICD-10-CM

## 2023-04-05 DIAGNOSIS — R5383 Other fatigue: Secondary | ICD-10-CM

## 2023-04-05 MED ORDER — VALSARTAN-HYDROCHLOROTHIAZIDE 160-25 MG PO TABS
1.0000 | ORAL_TABLET | Freq: Every day | ORAL | 1 refills | Status: DC
Start: 2023-04-05 — End: 2023-08-17
  Filled 2023-04-05: qty 90, 90d supply, fill #0
  Filled 2023-06-16: qty 90, 90d supply, fill #1

## 2023-04-05 NOTE — Progress Notes (Signed)
Subjective:    Patient ID: Marcus [name], male    DOB: 1941/04/23, 82 y.o.   MRN: [id number]  HPI  82 year old male who  has a past medical history of Acute meniscal tear of knee (left), Arthritis, AV block (08/01/2018), Benign essential hypertension (01/18/2007), Bronchiectasis with acute exacerbation (06/23/2020), Chest pain, atypical (08/23/2014), Chronic cough (10/07/2014), Coronary artery disease, Degeneration of lumbar intervertebral disc (10/14/2017), Diverticulosis of colon (07/07/2005), Elevated PSA (06/19/2014), GERD (gastroesophageal reflux disease) (01/18/2007), Gout, unspecified (01/18/2007), Hemorrhoids (01/19/2011), Hiatal hernia, History of colonic polyps (01/18/2007), Insomnia, unspecified (08/21/2007), Iron deficiency anemia, unspecified  (01/19/2011), Lumbar post-laminectomy syndrome (10/14/2017), Lumbar spondylolysis, Lumbar strain (12/14/2011), Obstructive sleep apnea (01/18/2007), Paraesophageal hernia, Primary osteoarthritis of knee (05/23/2015), Prostate cancer (HCC) (2016), S/P knee replacement (05/23/2015), Sensorineural hearing loss, bilateral, Vitamin B12 deficiency, and Vitamin D deficiency (10/28/2009).  He has been having his blood pressure checked at his medical appointments and his blood pressure has been elevated. He reports feeling dizzy and fatigued. . He has been prescribed Diovan 320-25 mg daily but has not been taking this.    BP Readings from Last 3 Encounters:  04/05/23 (!) 150/80  04/01/23 (!) 170/81  04/01/23 (!) 147/72     Review of Systems See HPI   Past Medical History:  Diagnosis Date   Acute meniscal tear of knee left   Arthritis    generalized   AV block 08/01/2018   Benign essential hypertension 01/18/2007   Bronchiectasis with acute exacerbation 06/23/2020   Chest pain, atypical 08/23/2014   Chronic cough 10/07/2014   Coronary artery disease    Degeneration of lumbar intervertebral disc 10/14/2017   Diverticulosis of colon  07/07/2005   Elevated PSA 06/19/2014   GERD (gastroesophageal reflux disease) 01/18/2007   Gout, unspecified 01/18/2007   Hemorrhoids 01/19/2011   Hiatal hernia    History of colonic polyps 01/18/2007   Insomnia, unspecified 08/21/2007   Iron deficiency anemia, unspecified  01/19/2011   Lumbar post-laminectomy syndrome 10/14/2017   Lumbar spondylolysis    Lumbar strain 12/14/2011   Obstructive sleep apnea 01/18/2007   uses CPAP nightly   Paraesophageal hernia    large   Primary osteoarthritis of knee 05/23/2015   Prostate cancer (HCC) 2016   S/P knee replacement 05/23/2015   Sensorineural hearing loss, bilateral    Vitamin B12 deficiency    Vitamin D deficiency 10/28/2009    Social History   Socioeconomic History   Marital status: Widowed    Spouse name: Not on file   Number of children: 4   Years of education: 85   Highest education level: Some college, no degree  Occupational History   Occupation: retired    Associate Professor: [employer]  Tobacco Use   Smoking status: Former    Current packs/day: 0.00    Average packs/day: 2.0 packs/day for 20.0 years (40.0 ttl pk-yrs)    Types: Cigarettes    Start date: 07/05/1954    Quit date: 07/05/1974    Years since quitting: 48.7   Smokeless tobacco: Never  Vaping Use   Vaping status: Never Used  Substance and Sexual Activity   Alcohol use: Not Currently   Drug use: No   Sexual activity: Not Currently  Other Topics Concern   Not on file  Social History Narrative   Recently widowed   Former Smoker    Alcohol use-yes 1-2 drinks per day      Occupation: Retired Associate Professor      Originally from [place], [place] -  in [place] > 10 yrs as of 2017      Social Determinants of Health   Financial Resource Strain: Low Risk  (07/28/2021)   Overall Financial Resource Strain (CARDIA)    Difficulty of Paying Living Expenses: Not hard at all  Food Insecurity: No Food Insecurity (01/14/2023)   Hunger Vital Sign    Worried About Running Out of  Food in the Last Year: Never true    Ran Out of Food in the Last Year: Never true  Transportation Needs: No Transportation Needs (01/14/2023)   PRAPARE - Administrator, Civil Service (Medical): No    Lack of Transportation (Non-Medical): No  Physical Activity: Inactive (07/28/2021)   Exercise Vital Sign    Days of Exercise per Week: 0 days    Minutes of Exercise per Session: 0 min  Stress: No Stress Concern Present (07/28/2021)   [employer] of Occupational Health - Occupational Stress Questionnaire    Feeling of Stress : Not at all  Social Connections: Moderately Isolated (08/04/2020)   Social Connection and Isolation Panel [NHANES]    Frequency of Communication with Friends and Family: Three times a week    Frequency of Social Gatherings with Friends and Family: More than three times a week    Attends Religious Services: More than 4 times per year    Active Member of Clubs or Organizations: No    Attends Banker Meetings: Never    Marital Status: Widowed  Intimate Partner Violence: Not At Risk (01/10/2023)   Humiliation, Afraid, Rape, and Kick questionnaire    Fear of Current or Ex-Partner: No    Emotionally Abused: No    Physically Abused: No    Sexually Abused: No    Past Surgical History:  Procedure Laterality Date   BILIARY BRUSHING  12/13/2022   Procedure: BILIARY BRUSHING;  Surgeon: Iva Boop, MD;  Location: [facility] ENDOSCOPY;  Service: Gastroenterology;;   BILIARY STENT PLACEMENT  12/13/2022   Procedure: BILIARY STENT PLACEMENT;  Surgeon: Iva Boop, MD;  Location: [facility] ENDOSCOPY;  Service: Gastroenterology;;   BILIARY STENT PLACEMENT N/A 02/07/2023   Procedure: BILIARY STENT PLACEMENT;  Surgeon: Lemar Lofty., MD;  Location: [facility] ENDOSCOPY;  Service: Gastroenterology;  Laterality: N/A;   BIOPSY  12/23/2022   Procedure: BIOPSY;  Surgeon: Lemar Lofty., MD;  Location: [facility] ENDOSCOPY;  Service: Gastroenterology;;   BRONCHIAL  WASHINGS  10/30/2019   Procedure: BRONCHIAL WASHINGS;  Surgeon: [name], DO;  Location: [facility] ENDOSCOPY;  Service: Endoscopy;;   CARDIAC CATHETERIZATION     CATARACT EXTRACTION W/ INTRAOCULAR LENS  IMPLANT, BILATERAL Bilateral    COLONOSCOPY     ENDOSCOPIC RETROGRADE CHOLANGIOPANCREATOGRAPHY (ERCP) WITH PROPOFOL N/A 02/07/2023   Procedure: ENDOSCOPIC RETROGRADE CHOLANGIOPANCREATOGRAPHY (ERCP) WITH PROPOFOL;  Surgeon: Lemar Lofty., MD;  Location: [facility] ENDOSCOPY;  Service: Gastroenterology;  Laterality: N/A;  Stent change   ERCP N/A 12/13/2022   Procedure: ENDOSCOPIC RETROGRADE CHOLANGIOPANCREATOGRAPHY (ERCP);  Surgeon: Iva Boop, MD;  Location: [facility] ENDOSCOPY;  Service: Gastroenterology;  Laterality: N/A;  with stent placement   ESOPHAGOGASTRODUODENOSCOPY     ESOPHAGOGASTRODUODENOSCOPY (EGD) WITH PROPOFOL N/A 12/23/2022   Procedure: ESOPHAGOGASTRODUODENOSCOPY (EGD) WITH PROPOFOL;  Surgeon: Meridee Score Netty Starring., MD;  Location: [facility] ENDOSCOPY;  Service: Gastroenterology;  Laterality: N/A;   EUS N/A 12/23/2022   Procedure: UPPER ENDOSCOPIC ULTRASOUND (EUS) RADIAL;  Surgeon: Lemar Lofty., MD;  Location: [facility] ENDOSCOPY;  Service: Gastroenterology;  Laterality: N/A;   FINE NEEDLE ASPIRATION  12/23/2022  Procedure: FINE NEEDLE ASPIRATION (FNA) RADIAL;  Surgeon: Meridee Score Netty Starring., MD;  Location: [facility] ENDOSCOPY;  Service: Gastroenterology;;   IR IMAGING GUIDED PORT INSERTION  01/04/2023   KNEE ARTHROSCOPY Right 2007   KNEE ARTHROSCOPY  07/13/2011   Procedure: ARTHROSCOPY KNEE;  Surgeon: Erasmo Leventhal;  Location: [facility];  Service: Orthopedics;  Laterality: Left;  partial menisectomy with chondrylplasty   KNEE SURGERY Left    LAMINECTOMY AND MICRODISCECTOMY LUMBAR SPINE  MARCH  2008   L3 -  4   LAPAROSCOPIC INCISIONAL / UMBILICAL / VENTRAL HERNIA REPAIR  2006   PACEMAKER IMPLANT N/A 08/02/2018   Procedure: PACEMAKER IMPLANT;  Surgeon: Marinus Maw, MD;   Location: [facility]IVE [facility];  Service: Cardiovascular;  Laterality: N/A;   PROSTATE BIOPSY     RADIOACTIVE SEED IMPLANT N/A 02/12/2019   Procedure: RADIOACTIVE SEED IMPLANT/BRACHYTHERAPY IMPLANT;  Surgeon: Marcine Matar, MD;  Location: [facility] ORS;  Service: Urology;  Laterality: N/A;  90 MINS   REMOVAL OF STONES  02/07/2023   Procedure: REMOVAL OF Sludge;  Surgeon: Meridee Score Netty Starring., MD;  Location: [facility] ENDOSCOPY;  Service: Gastroenterology;;   SHOULDER ARTHROSCOPY Right 05-23-2007   SIGMOID COLECTOMY FOR CANCER  1989   SPACE OAR INSTILLATION N/A 02/12/2019   Procedure: SPACE OAR INSTILLATION;  Surgeon: Marcine Matar, MD;  Location: [facility] ORS;  Service: Urology;  Laterality: N/A;   SPHINCTEROTOMY  12/13/2022   Procedure: SPHINCTEROTOMY;  Surgeon: Iva Boop, MD;  Location[facility]COPY;  Service: Gastroenterology;;   Francine Graven REMOVAL  02/07/2023   Procedure: STENT REMOVAL;  Surgeon: Lemar Lofty., MD;  Location: [facility] ENDOSCOPY;  Service: Gastroenterology;;   TOTAL KNEE ARTHROPLASTY Left 05/23/2015   Procedure: LEFT TOTAL KNEE ARTHROPLASTY;  Surgeon: Eugenia Mcalpine, MD;  Location: [facility] ORS;  Service: Orthopedics;  Laterality: Left;   VERTICAL BANDED GASTROPLASTY  1986   VIDEO BRONCHOSCOPY N/A 10/30/2019   Procedure: VIDEO BRONCHOSCOPY WITH BRONICAL ALVEROLAR LAVAGE WITHOUT FLUORO;  Surgeon: [name], DO;  Location: [facility] ENDOSCOPY;  Service: Endoscopy;  Laterality: N/A;    Family History  Problem Relation Age of Onset   Stroke Paternal Grandfather    Heart disease Paternal Grandfather    Esophagitis Father        died from perforated esophagus   Breast cancer Mother    Colon cancer Maternal Grandmother        questionable   Esophageal cancer Neg Hx    Prostate cancer Neg Hx    Stomach cancer Neg Hx     No Known Allergies  Current Outpatient Medications on File Prior to Visit  Medication Sig Dispense Refill   acetaminophen (TYLENOL) 325 MG tablet Take 2 tablets (650 mg  total) by mouth every 6 (six) hours as needed for mild pain (or Fever >/= 101). 20 tablet 0   allopurinol (ZYLOPRIM) 300 MG tablet Take 1 tablet (300 mg total) by mouth daily. 90 tablet 3   apixaban (ELIQUIS) 5 MG TABS tablet Take 1 tablet (5 mg total) by mouth 2 (two) times daily. 180 tablet 1   Calcium Carb-Cholecalciferol (CALCIUM 600 + D PO) Take 1 tablet by mouth daily. 800 units of vitamin D     docusate sodium (COLACE) 100 MG capsule Take 1 capsule (100 mg total) by mouth 2 (two) times daily. 180 capsule 1   esomeprazole (NEXIUM) 40 MG capsule Take 1 capsule (40 mg total) by mouth 2 (two) times daily before a meal. 60 capsule 12   Eszopiclone  3 MG TABS Take 1 tablet (3 mg total) by mouth at bedtime. Take immediately before bedtime 30 tablet 2   ipratropium (ATROVENT) 0.03 % nasal spray Place 2 sprays into both nostrils every 12 (twelve) hours. 30 mL 1   lidocaine-prilocaine (EMLA) cream Apply 1 Application topically as needed. 30 g 0   Multiple Vitamin (MULTIVITAMIN) tablet Take 1 tablet by mouth in the morning.     ondansetron (ZOFRAN) 8 MG tablet Take 1 tablet (8 mg total) by mouth every 8 (eight) hours as needed. 20 tablet 0   oxybutynin (DITROPAN) 5 MG tablet Take 2.5 mg by mouth 2 (two) times daily.     polyethylene glycol powder (GLYCOLAX/MIRALAX) 17 GM/SCOOP powder Take 17g (1 capful) by mouth as directed daily as needed for mild constipation. 238 g 0   potassium chloride (KLOR-CON M) 10 MEQ tablet Take 1 tablet (10 mEq total) by mouth 2 (two) times daily. 60 tablet 1   Respiratory Therapy Supplies (FLUTTER) DEVI 1 each by Does not apply route in the morning and at bedtime. 1 each 0   sodium chloride HYPERTONIC 3 % nebulizer solution Take by nebulization as needed for other. 750 mL 1   tamsulosin (FLOMAX) 0.4 MG CAPS capsule Take 0.4 mg by mouth daily.     torsemide (DEMADEX) 20 MG tablet Take 1 tablet (20 mg total) by mouth daily. 30 tablet 2   No current facility-administered  medications on file prior to visit.    BP (!) 150/80   Pulse (!) 55   Temp 97.8 F (36.6 C) (Oral)   Ht 5\' 8"  (1.727 m)   Wt 214 lb (97.1 kg)   SpO2 97%   BMI 32.54 kg/m       Objective:   Physical Exam Vitals and nursing note reviewed.  Constitutional:      Appearance: Normal appearance.  Cardiovascular:     Rate and Rhythm: Normal rate and regular rhythm.     Pulses: Normal pulses.     Heart sounds: Normal heart sounds.  Pulmonary:     Effort: Pulmonary effort is normal.     Breath sounds: Normal breath sounds.  Musculoskeletal:        General: Normal range of motion.  Skin:    General: Skin is warm and dry.     Capillary Refill: Capillary refill takes less than 2 seconds.  Neurological:     General: No focal deficit present.     Mental Status: He is alert and oriented to person, place, and time.  Psychiatric:        Mood and Affect: Mood normal.        Behavior: Behavior normal.        Thought Content: Thought content normal.        Judgment: Judgment normal.       Assessment & Plan:  1. Essential hypertension - BP in office was elevated. Will restart on Diovan-HCT but at lower dose.  - valsartan-hydrochlorothiazide (DIOVAN-HCT) 160-25 MG tablet; Take 1 tablet by mouth daily.  Dispense: 90 tablet; Refill: 1 - Follow up in 1 month or sooner if needed 2. Other fatigue - Advised likely from  pancreatic cancer treatment   [name], NP  Time spent with patient today was 33 minutes which consisted of chart review, discussing HTN, fatigue, and treatment for pancreatic cancer. , work up, treatment answering questions and documentation.

## 2023-04-06 ENCOUNTER — Other Ambulatory Visit: Payer: Self-pay

## 2023-04-06 NOTE — Progress Notes (Signed)
The proposed treatment discussed in conference is for discussion purpose only and is not a binding recommendation.  The patients have not been physically examined, or presented with their treatment options.  Therefore, final treatment plans cannot be decided.  

## 2023-04-10 ENCOUNTER — Other Ambulatory Visit: Payer: Self-pay | Admitting: Oncology

## 2023-04-15 ENCOUNTER — Inpatient Hospital Stay: Payer: No Typology Code available for payment source

## 2023-04-15 ENCOUNTER — Encounter: Payer: Self-pay | Admitting: *Deleted

## 2023-04-15 ENCOUNTER — Other Ambulatory Visit: Payer: Self-pay | Admitting: *Deleted

## 2023-04-15 ENCOUNTER — Inpatient Hospital Stay: Payer: No Typology Code available for payment source | Admitting: Oncology

## 2023-04-15 ENCOUNTER — Inpatient Hospital Stay: Payer: No Typology Code available for payment source | Attending: Oncology

## 2023-04-15 VITALS — BP 142/69 | HR 64 | Temp 98.1°F | Resp 18 | Ht 68.0 in | Wt 208.4 lb

## 2023-04-15 VITALS — BP 162/76 | HR 60 | Resp 18

## 2023-04-15 DIAGNOSIS — I442 Atrioventricular block, complete: Secondary | ICD-10-CM | POA: Diagnosis not present

## 2023-04-15 DIAGNOSIS — M109 Gout, unspecified: Secondary | ICD-10-CM | POA: Diagnosis not present

## 2023-04-15 DIAGNOSIS — I4891 Unspecified atrial fibrillation: Secondary | ICD-10-CM | POA: Diagnosis not present

## 2023-04-15 DIAGNOSIS — G4730 Sleep apnea, unspecified: Secondary | ICD-10-CM | POA: Insufficient documentation

## 2023-04-15 DIAGNOSIS — D649 Anemia, unspecified: Secondary | ICD-10-CM | POA: Insufficient documentation

## 2023-04-15 DIAGNOSIS — I2510 Atherosclerotic heart disease of native coronary artery without angina pectoris: Secondary | ICD-10-CM | POA: Diagnosis not present

## 2023-04-15 DIAGNOSIS — K219 Gastro-esophageal reflux disease without esophagitis: Secondary | ICD-10-CM | POA: Diagnosis not present

## 2023-04-15 DIAGNOSIS — C259 Malignant neoplasm of pancreas, unspecified: Secondary | ICD-10-CM | POA: Insufficient documentation

## 2023-04-15 DIAGNOSIS — J470 Bronchiectasis with acute lower respiratory infection: Secondary | ICD-10-CM | POA: Insufficient documentation

## 2023-04-15 DIAGNOSIS — Z5111 Encounter for antineoplastic chemotherapy: Secondary | ICD-10-CM | POA: Diagnosis present

## 2023-04-15 DIAGNOSIS — Z9884 Bariatric surgery status: Secondary | ICD-10-CM | POA: Diagnosis not present

## 2023-04-15 DIAGNOSIS — K831 Obstruction of bile duct: Secondary | ICD-10-CM | POA: Insufficient documentation

## 2023-04-15 DIAGNOSIS — I10 Essential (primary) hypertension: Secondary | ICD-10-CM | POA: Insufficient documentation

## 2023-04-15 DIAGNOSIS — Z85038 Personal history of other malignant neoplasm of large intestine: Secondary | ICD-10-CM | POA: Insufficient documentation

## 2023-04-15 LAB — CBC WITH DIFFERENTIAL (CANCER CENTER ONLY)
Abs Immature Granulocytes: 0.04 10*3/uL (ref 0.00–0.07)
Basophils Absolute: 0 10*3/uL (ref 0.0–0.1)
Basophils Relative: 1 %
Eosinophils Absolute: 0.1 10*3/uL (ref 0.0–0.5)
Eosinophils Relative: 2 %
HCT: 30.2 % — ABNORMAL LOW (ref 39.0–52.0)
Hemoglobin: 9.6 g/dL — ABNORMAL LOW (ref 13.0–17.0)
Immature Granulocytes: 1 %
Lymphocytes Relative: 25 %
Lymphs Abs: 1.1 10*3/uL (ref 0.7–4.0)
MCH: 27.9 pg (ref 26.0–34.0)
MCHC: 31.8 g/dL (ref 30.0–36.0)
MCV: 87.8 fL (ref 80.0–100.0)
Monocytes Absolute: 0.5 10*3/uL (ref 0.1–1.0)
Monocytes Relative: 12 %
Neutro Abs: 2.6 10*3/uL (ref 1.7–7.7)
Neutrophils Relative %: 59 %
Platelet Count: 186 10*3/uL (ref 150–400)
RBC: 3.44 MIL/uL — ABNORMAL LOW (ref 4.22–5.81)
RDW: 19.3 % — ABNORMAL HIGH (ref 11.5–15.5)
WBC Count: 4.4 10*3/uL (ref 4.0–10.5)
nRBC: 0 % (ref 0.0–0.2)

## 2023-04-15 LAB — CMP (CANCER CENTER ONLY)
ALT: 17 U/L (ref 0–44)
AST: 18 U/L (ref 15–41)
Albumin: 3.5 g/dL (ref 3.5–5.0)
Alkaline Phosphatase: 57 U/L (ref 38–126)
Anion gap: 7 (ref 5–15)
BUN: 29 mg/dL — ABNORMAL HIGH (ref 8–23)
CO2: 29 mmol/L (ref 22–32)
Calcium: 9.2 mg/dL (ref 8.9–10.3)
Chloride: 106 mmol/L (ref 98–111)
Creatinine: 0.99 mg/dL (ref 0.61–1.24)
GFR, Estimated: >60 mL/min (ref >60)
Glucose, Bld: 139 mg/dL — ABNORMAL HIGH (ref 70–99)
Potassium: 3.7 mmol/L (ref 3.5–5.1)
Sodium: 142 mmol/L (ref 135–145)
Total Bilirubin: 0.3 mg/dL (ref 0.3–1.2)
Total Protein: 6 g/dL — ABNORMAL LOW (ref 6.5–8.1)

## 2023-04-15 MED ORDER — HEPARIN SOD (PORK) LOCK FLUSH 100 UNIT/ML IV SOLN
500.0000 [IU] | Freq: Once | INTRAVENOUS | Status: AC | PRN
  Administered 2023-04-15: 500 [IU]

## 2023-04-15 MED ORDER — SODIUM CHLORIDE 0.9 % IV SOLN: Freq: Once | INTRAVENOUS | Status: AC

## 2023-04-15 MED ORDER — PROCHLORPERAZINE MALEATE 10 MG PO TABS
10.0000 mg | ORAL_TABLET | Freq: Once | ORAL | Status: AC
  Administered 2023-04-15: 10 mg via ORAL
  Filled 2023-04-15: qty 1

## 2023-04-15 MED ORDER — SODIUM CHLORIDE 0.9% FLUSH
10.0000 mL | INTRAVENOUS | Status: DC | PRN
  Administered 2023-04-15: 10 mL

## 2023-04-15 MED ORDER — PACLITAXEL PROTEIN-BOUND CHEMO INJECTION 100 MG
100.0000 mg/m2 | Freq: Once | INTRAVENOUS | Status: AC
  Administered 2023-04-15: 200 mg via INTRAVENOUS
  Filled 2023-04-15: qty 40

## 2023-04-15 MED ORDER — SODIUM CHLORIDE 0.9 % IV SOLN
800.0000 mg/m2 | Freq: Once | INTRAVENOUS | Status: AC
  Administered 2023-04-15: 1710 mg via INTRAVENOUS
  Filled 2023-04-15: qty 25.98

## 2023-04-15 NOTE — Progress Notes (Signed)
Marcus [name] OFFICE PROGRESS NOTE   Diagnosis: Pancreas cancer  INTERVAL HISTORY:   Marcus [name] returns as scheduled.  He completed another treatment with gemcitabine/Abraxane 04/01/2023.  No rash or swelling following this cycle of chemotherapy.  He discontinued torsemide due to polyuria.  He reports a poor appetite.  Objective:  Vital signs in last 24 hours:  Blood pressure (!) 142/69, pulse 64, temperature 98.1 F (36.7 C), temperature source Temporal, resp. rate 18, height 5\' 8"  (1.727 m), weight 208 lb 6.4 oz (94.5 kg), SpO2 100%.    HEENT: No thrush, few small ecchymoses at the buccal mucosa Resp: Lungs clear bilaterally Cardio: Regular rate and rhythm GI: No hepatosplenomegaly Vascular: Trace edema at the right lower leg  Skin: No rash or swelling at the right hand  Portacath/PICC-without erythema  Lab Results:  Lab Results  Component Value Date   WBC 4.4 04/15/2023   HGB 9.6 (L) 04/15/2023   HCT 30.2 (L) 04/15/2023   MCV 87.8 04/15/2023   PLT 186 04/15/2023   NEUTROABS 2.6 04/15/2023    CMP  Lab Results  Component Value Date   NA 142 04/15/2023   K 3.7 04/15/2023   CL 106 04/15/2023   CO2 29 04/15/2023   GLUCOSE 139 (H) 04/15/2023   BUN 29 (H) 04/15/2023   CREATININE 0.99 04/15/2023   CALCIUM 9.2 04/15/2023   PROT 6.0 (L) 04/15/2023   ALBUMIN 3.5 04/15/2023   AST 18 04/15/2023   ALT 17 04/15/2023   ALKPHOS 57 04/15/2023   BILITOT 0.3 04/15/2023   GFRNONAA >60 04/15/2023   GFRAA >60 02/07/2019    Lab Results  Component Value Date   CEA1 6.4 (H) 12/11/2022   CEA 1.1 03/03/2010   CAN199 574 (H) 04/01/2023    Lab Results  Component Value Date   INR 1.3 (H) 01/09/2023   LABPROT 16.0 (H) 01/09/2023    Imaging:  No results found.  Medications: I have reviewed the patient's current medications.   Assessment/Plan:  Pancreas cancer CT abdomen/pelvis 12/03/2022-suspected pancreas mass in the proximal body measuring 2 cm with  probable mass effect on the superior aspect of the SMV, multiple hypodense liver lesions, many are new from a remote CT-indeterminate, 2 cystic lesions in the pancreas tail-nonspecific prior bariatric surgery ERCP 12/13/2022-malignant appearing common bile duct stricture, plastic stent placed, brushing cytology-no malignant cells EUS 12/23/2022-30 x 33 mm pancreas head mass with invasion of the SMV, no malignant appearing lymph nodes, 4 cysts in the visualized liver, FNA-adenocarcinoma Cycle 1 gemcitabine and abraxane 01/07/2023 CTs 01/09/2023-multiple low-attenuation liver lesions unchanged, largest of which are clearly benign fluid attenuation cyst, others more ill-defined and incompletely characterized suspicious for small metastases.  Similar appearance of hypodense mass in the pancreatic head.  Distended gallbladder with mild gallbladder wall thickening and adjacent pericholecystic fat stranding.  Sludge in the gallbladder fundus.  Common bile duct stent in place with diminished, persistent intrahepatic biliary ductal dilatation.  Negative for pulmonary embolism.  Acute appearing heterogeneous airspace opacity of the superior segment left lower lobe consistent with infection or aspiration. Cycle 2 gemcitabine and abraxane 01/21/2023, gemcitabine dose reduced due to neutropenia following cycle 1 Cycle 3 gemcitabine/Abraxane 02/04/2023 Cycle 4 gemcitabine/Abraxane 02/17/2023 Cycle 5 gemcitabine/Abraxane 03/04/2023 Cycle 6 gemcitabine/Abraxane 03/17/2023 CT abdomen/pelvis 03/28/2023-stable pancreas mass, multiple small hypoattenuating liver lesions (review of CTs at the GI tumor conference is most consistent with liver metastases-some smaller compared to the baseline CT) Cycle 7 gemcitabine/Abraxane 04/01/2023 Cycle 8 gemcitabine/Abraxane 04/15/2023 Biliary obstruction secondary to  1-status post ERCP/stent placement 12/13/2022 Coronary artery disease Atrial fibrillation Gout Sleep apnea Status post bariatric  surgery Hypertension Bronchiectasis Gastroesophageal reflux disease History of colon cancer in his 40s Complete heart block-pacemaker placed Admission 01/09/2023 with a fever and failure to thrive, CT chest with evidence of left lung pneumonia, CT abdomen/pelvis with a distended gallbladder and adjacent fat stranding Anemia   Disposition: Marcus [name] appears stable.  He is tolerating the gemcitabine/Abraxane well.  His case was presented at the GI tumor conference 04/06/2023.  Review of the CT images is consistent with liver metastases, some have improved.  The plan is to complete approximately 5 more cycles of gemcitabine/Abraxane prior to another restaging evaluation.  We will continue following the CA 19-9.  He will remain off of torsemide.  He will resume torsemide if he develops recurrent edema.  He will follow-up with cardiology for recommendations regarding diuretic therapy.  Marcus Papas, MD  04/15/2023  9:33 AM

## 2023-04-15 NOTE — Patient Instructions (Addendum)
[facility]   Discharge Instructions: Thank you for choosing [facility] to provide your oncology and hematology care.   If you have a lab appointment with the Cancer Center, please go directly to the Cancer Center and check in at the registration area.   Wear comfortable clothing and clothing appropriate for easy access to any Portacath or PICC line.   We strive to give you quality time with your provider. You may need to reschedule your appointment if you arrive late (15 or more minutes).  Arriving late affects you and other patients whose appointments are after yours.  Also, if you miss three or more appointments without notifying the office, you may be dismissed from the clinic at the provider's discretion.      For prescription refill requests, have your pharmacy contact our office and allow 72 hours for refills to be completed.    Today you received the following chemotherapy and/or immunotherapy agents Paclitaxel-protein bound (ABRAXANE) & Gemcitabine (GEMZAR).   To help prevent nausea and vomiting after your treatment, we encourage you to take your nausea medication as directed.  BELOW ARE SYMPTOMS THAT SHOULD BE REPORTED IMMEDIATELY: *FEVER GREATER THAN 100.4 F (38 C) OR HIGHER *CHILLS OR SWEATING *NAUSEA AND VOMITING THAT IS NOT CONTROLLED WITH YOUR NAUSEA MEDICATION *UNUSUAL SHORTNESS OF BREATH *UNUSUAL BRUISING OR BLEEDING *URINARY PROBLEMS (pain or burning when urinating, or frequent urination) *BOWEL PROBLEMS (unusual diarrhea, constipation, pain near the anus) TENDERNESS IN MOUTH AND THROAT WITH OR WITHOUT PRESENCE OF ULCERS (sore throat, sores in mouth, or a toothache) UNUSUAL RASH, SWELLING OR PAIN  UNUSUAL VAGINAL DISCHARGE OR ITCHING   Items with * indicate a potential emergency and should be followed up as soon as possible or go to the Emergency Department if any problems should occur.  Please show the CHEMOTHERAPY  ALERT CARD or IMMUNOTHERAPY ALERT CARD at check-in to the Emergency Department and triage nurse.  Should you have questions after your visit or need to cancel or reschedule your appointment, please contact [facility]  Dept: [phone]  and follow the prompts.  Office hours are 8:00 a.m. to 4:30 p.m. Monday - Friday. Please note that voicemails left after 4:00 p.m. may not be returned until the following business day.  We are closed weekends and major holidays. You have access to a nurse at all times for urgent questions. Please call the main number to the clinic Dept: [phone] and follow the prompts.   For any non-urgent questions, you may also contact your provider using MyChart. We now offer e-Visits for anyone 18 and older to request care online for non-urgent symptoms. For details visit [email or web address].   Also download the MyChart app! Go to the app store, search "MyChart", open the app, select [facility], and log in with your MyChart username and password.  Paclitaxel Nanoparticle Albumin-Bound Injection What is this medication? NANOPARTICLE ALBUMIN-BOUND PACLITAXEL (Na no PAHR ti kuhl al BYOO muhn-bound PAK li TAX el) treats some types of cancer. It works by slowing down the growth of cancer cells. This medicine may be used for other purposes; ask your health care provider or pharmacist if you have questions. COMMON BRAND NAME(S): Abraxane What should I tell my care team before I take this medication? They need to know if you have any of these conditions: Liver disease Low white blood cell levels An unusual or allergic reaction to paclitaxel, albumin, other medications, foods, dyes, or preservatives   If you or your partner are pregnant or trying to get pregnant Breast-feeding How should I use this medication? This medication is injected into a vein. It is given by your care team in a hospital or clinic setting. Talk to your care team  about the use of this medication in children. Special care may be needed. Overdosage: If you think you have taken too much of this medicine contact a poison control center or emergency room at once. NOTE: This medicine is only for you. Do not share this medicine with others. What if I miss a dose? Keep appointments for follow-up doses. It is important not to miss your dose. Call your care team if you are unable to keep an appointment. What may interact with this medication? Other medications may affect the way this medication works. Talk with your care team about all of the medications you take. They may suggest changes to your treatment plan to lower the risk of side effects and to make sure your medications work as intended. This list may not describe all possible interactions. Give your health care provider a list of all the medicines, herbs, non-prescription drugs, or dietary supplements you use. Also tell them if you smoke, drink alcohol, or use illegal drugs. Some items may interact with your medicine. What should I watch for while using this medication? Your condition will be monitored carefully while you are receiving this medication. You may need blood work while taking this medication. This medication may make you feel generally unwell. This is not uncommon as chemotherapy can affect healthy cells as well as cancer cells. Report any side effects. Continue your course of treatment even though you feel ill unless your care team tells you to stop. This medication can cause serious allergic reactions. To reduce the risk, your care team may give you other medications to take before receiving this one. Be sure to follow the directions from your care team. This medication may increase your risk of getting an infection. Call your care team for advice if you get a fever, chills, sore throat, or other symptoms of a cold or flu. Do not treat yourself. Try to avoid being around people who are sick. This  medication may increase your risk to bruise or bleed. Call your care team if you notice any unusual bleeding. Be careful brushing or flossing your teeth or using a toothpick because you may get an infection or bleed more easily. If you have any dental work done, tell your dentist you are receiving this medication. Talk to your care team if you or your partner may be pregnant. Serious birth defects can occur if you take this medication during pregnancy and for 6 months after the last dose. You will need a negative pregnancy test before starting this medication. Contraception is recommended while taking this medication and for 6 months after the last dose. Your care team can help you find the option that works for you. If your partner can get pregnant, use a condom during sex while taking this medication and for 3 months after the last dose. Do not breastfeed while taking this medication and for 2 weeks after the last dose. This medication may cause infertility. Talk to your care team if you are concerned about your fertility. What side effects may I notice from receiving this medication? Side effects that you should report to your care team as soon as possible: Allergic reactions--skin rash, itching, hives, swelling of the face, lips, tongue, or throat Dry cough,   shortness of breath or trouble breathing Infection--fever, chills, cough, sore throat, wounds that don't heal, pain or trouble when passing urine, general feeling of discomfort or being unwell Low red blood cell level--unusual weakness or fatigue, dizziness, headache, trouble breathing Pain, tingling, or numbness in the hands or feet Stomach pain, unusual weakness or fatigue, nausea, vomiting, diarrhea, or fever that lasts longer than expected Unusual bruising or bleeding Side effects that usually do not require medical attention (report to your care team if they continue or are bothersome): Diarrhea Fatigue Hair loss Loss of  appetite Nausea Vomiting This list may not describe all possible side effects. Call your doctor for medical advice about side effects. You may report side effects to FDA at 1-800-FDA-1088. Where should I keep my medication? This medication is given in a hospital or clinic. It will not be stored at home. NOTE: This sheet is a summary. It may not cover all possible information. If you have questions about this medicine, talk to your doctor, pharmacist, or health care provider.  2024 Elsevier/Gold Standard (2021-11-05 00:00:00)  Gemcitabine Injection What is this medication? GEMCITABINE (jem SYE ta been) treats some types of cancer. It works by slowing down the growth of cancer cells. This medicine may be used for other purposes; ask your health care provider or pharmacist if you have questions. COMMON BRAND NAME(S): Gemzar, Infugem What should I tell my care team before I take this medication? They need to know if you have any of these conditions: Blood disorders Infection Kidney disease Liver disease Lung or breathing disease, such as asthma or COPD Recent or ongoing radiation therapy An unusual or allergic reaction to gemcitabine, other medications, foods, dyes, or preservatives If you or your partner are pregnant or trying to get pregnant Breast-feeding How should I use this medication? This medication is injected into a vein. It is given by your care team in a hospital or clinic setting. Talk to your care team about the use of this medication in children. Special care may be needed. Overdosage: If you think you have taken too much of this medicine contact a poison control center or emergency room at once. NOTE: This medicine is only for you. Do not share this medicine with others. What if I miss a dose? Keep appointments for follow-up doses. It is important not to miss your dose. Call your care team if you are unable to keep an appointment. What may interact with this  medication? Interactions have not been studied. This list may not describe all possible interactions. Give your health care provider a list of all the medicines, herbs, non-prescription drugs, or dietary supplements you use. Also tell them if you smoke, drink alcohol, or use illegal drugs. Some items may interact with your medicine. What should I watch for while using this medication? Your condition will be monitored carefully while you are receiving this medication. This medication may make you feel generally unwell. This is not uncommon, as chemotherapy can affect healthy cells as well as cancer cells. Report any side effects. Continue your course of treatment even though you feel ill unless your care team tells you to stop. In some cases, you may be given additional medications to help with side effects. Follow all directions for their use. This medication may increase your risk of getting an infection. Call your care team for advice if you get a fever, chills, sore throat, or other symptoms of a cold or flu. Do not treat yourself. Try to avoid being around   people who are sick. This medication may increase your risk to bruise or bleed. Call your care team if you notice any unusual bleeding. Be careful brushing or flossing your teeth or using a toothpick because you may get an infection or bleed more easily. If you have any dental work done, tell your dentist you are receiving this medication. Avoid taking medications that contain aspirin, acetaminophen, ibuprofen, naproxen, or ketoprofen unless instructed by your care team. These medications may hide a fever. Talk to your care team if you or your partner wish to become pregnant or think you might be pregnant. This medication can cause serious birth defects if taken during pregnancy and for 6 months after the last dose. A negative pregnancy test is required before starting this medication. A reliable form of contraception is recommended while taking  this medication and for 6 months after the last dose. Talk to your care team about effective forms of contraception. Do not father a child while taking this medication and for 3 months after the last dose. Use a condom while having sex during this time period. Do not breastfeed while taking this medication and for at least 1 week after the last dose. This medication may cause infertility. Talk to your care team if you are concerned about your fertility. What side effects may I notice from receiving this medication? Side effects that you should report to your care team as soon as possible: Allergic reactions--skin rash, itching, hives, swelling of the face, lips, tongue, or throat Capillary leak syndrome--stomach or muscle pain, unusual weakness or fatigue, feeling faint or lightheaded, decrease in the amount of urine, swelling of the ankles, hands, or feet, trouble breathing Infection--fever, chills, cough, sore throat, wounds that don't heal, pain or trouble when passing urine, general feeling of discomfort or being unwell Liver injury--right upper belly pain, loss of appetite, nausea, light-colored stool, dark yellow or brown urine, yellowing skin or eyes, unusual weakness or fatigue Low red blood cell level--unusual weakness or fatigue, dizziness, headache, trouble breathing Lung injury--shortness of breath or trouble breathing, cough, spitting up blood, chest pain, fever Stomach pain, bloody diarrhea, pale skin, unusual weakness or fatigue, decrease in the amount of urine, which may be signs of hemolytic uremic syndrome Sudden and severe headache, confusion, change in vision, seizures, which may be signs of posterior reversible encephalopathy syndrome (PRES) Unusual bruising or bleeding Side effects that usually do not require medical attention (report to your care team if they continue or are bothersome): Diarrhea Drowsiness Hair loss Nausea Pain, redness, or swelling with sores inside the  mouth or throat Vomiting This list may not describe all possible side effects. Call your doctor for medical advice about side effects. You may report side effects to FDA at 1-800-FDA-1088. Where should I keep my medication? This medication is given in a hospital or clinic. It will not be stored at home. NOTE: This sheet is a summary. It may not cover all possible information. If you have questions about this medicine, talk to your doctor, pharmacist, or health care provider.  2024 Elsevier/Gold Standard (2021-10-27 00:00:00)  

## 2023-04-15 NOTE — Progress Notes (Signed)
Patient seen by Dr. Thornton Papas today  Vitals are within treatment parameters:Yes   Labs are within treatment parameters: Yes   Treatment plan has been signed: Yes   Per physician team, Patient is ready for treatment and there are NO modifications to the treatment plan.

## 2023-04-21 ENCOUNTER — Encounter: Payer: Self-pay | Admitting: Oncology

## 2023-04-22 ENCOUNTER — Other Ambulatory Visit (HOSPITAL_BASED_OUTPATIENT_CLINIC_OR_DEPARTMENT_OTHER): Payer: Self-pay

## 2023-04-22 ENCOUNTER — Encounter: Payer: Self-pay | Admitting: Oncology

## 2023-04-24 ENCOUNTER — Other Ambulatory Visit: Payer: Self-pay | Admitting: Oncology

## 2023-04-29 ENCOUNTER — Inpatient Hospital Stay: Payer: No Typology Code available for payment source

## 2023-04-29 ENCOUNTER — Encounter: Payer: Self-pay | Admitting: Nurse Practitioner

## 2023-04-29 ENCOUNTER — Inpatient Hospital Stay (HOSPITAL_BASED_OUTPATIENT_CLINIC_OR_DEPARTMENT_OTHER): Payer: No Typology Code available for payment source | Admitting: Nurse Practitioner

## 2023-04-29 VITALS — BP 144/60 | HR 63 | Temp 98.1°F | Resp 18 | Wt 216.4 lb

## 2023-04-29 VITALS — BP 159/75 | HR 60 | Resp 18

## 2023-04-29 DIAGNOSIS — C259 Malignant neoplasm of pancreas, unspecified: Secondary | ICD-10-CM | POA: Diagnosis not present

## 2023-04-29 DIAGNOSIS — Z5111 Encounter for antineoplastic chemotherapy: Secondary | ICD-10-CM | POA: Diagnosis not present

## 2023-04-29 LAB — CBC WITH DIFFERENTIAL (CANCER CENTER ONLY)
Abs Immature Granulocytes: 0.08 10*3/uL — ABNORMAL HIGH (ref 0.00–0.07)
Basophils Absolute: 0 10*3/uL (ref 0.0–0.1)
Basophils Relative: 1 %
Eosinophils Absolute: 0.1 10*3/uL (ref 0.0–0.5)
Eosinophils Relative: 2 %
HCT: 27.5 % — ABNORMAL LOW (ref 39.0–52.0)
Hemoglobin: 8.9 g/dL — ABNORMAL LOW (ref 13.0–17.0)
Immature Granulocytes: 2 %
Lymphocytes Relative: 23 %
Lymphs Abs: 0.9 10*3/uL (ref 0.7–4.0)
MCH: 28.2 pg (ref 26.0–34.0)
MCHC: 32.4 g/dL (ref 30.0–36.0)
MCV: 87 fL (ref 80.0–100.0)
Monocytes Absolute: 0.6 10*3/uL (ref 0.1–1.0)
Monocytes Relative: 16 %
Neutro Abs: 2.3 10*3/uL (ref 1.7–7.7)
Neutrophils Relative %: 56 %
Platelet Count: 201 10*3/uL (ref 150–400)
RBC: 3.16 MIL/uL — ABNORMAL LOW (ref 4.22–5.81)
RDW: 19.7 % — ABNORMAL HIGH (ref 11.5–15.5)
WBC Count: 4.1 10*3/uL (ref 4.0–10.5)
nRBC: 0 % (ref 0.0–0.2)

## 2023-04-29 LAB — CMP (CANCER CENTER ONLY)
ALT: 14 U/L (ref 0–44)
AST: 15 U/L (ref 15–41)
Albumin: 3.5 g/dL (ref 3.5–5.0)
Alkaline Phosphatase: 60 U/L (ref 38–126)
Anion gap: 4 — ABNORMAL LOW (ref 5–15)
BUN: 24 mg/dL — ABNORMAL HIGH (ref 8–23)
CO2: 28 mmol/L (ref 22–32)
Calcium: 9.1 mg/dL (ref 8.9–10.3)
Chloride: 107 mmol/L (ref 98–111)
Creatinine: 0.97 mg/dL (ref 0.61–1.24)
GFR, Estimated: >60 mL/min (ref >60)
Glucose, Bld: 103 mg/dL — ABNORMAL HIGH (ref 70–99)
Potassium: 4.1 mmol/L (ref 3.5–5.1)
Sodium: 139 mmol/L (ref 135–145)
Total Bilirubin: 0.3 mg/dL (ref 0.3–1.2)
Total Protein: 6.3 g/dL — ABNORMAL LOW (ref 6.5–8.1)

## 2023-04-29 MED ORDER — HEPARIN SOD (PORK) LOCK FLUSH 100 UNIT/ML IV SOLN
500.0000 [IU] | Freq: Once | INTRAVENOUS | Status: AC | PRN
  Administered 2023-04-29: 500 [IU]

## 2023-04-29 MED ORDER — SODIUM CHLORIDE 0.9% FLUSH
10.0000 mL | INTRAVENOUS | Status: DC | PRN
  Administered 2023-04-29: 10 mL

## 2023-04-29 MED ORDER — PACLITAXEL PROTEIN-BOUND CHEMO INJECTION 100 MG
100.0000 mg/m2 | Freq: Once | INTRAVENOUS | Status: AC
Start: 2023-04-29 — End: 2023-04-29
  Administered 2023-04-29: 200 mg via INTRAVENOUS
  Filled 2023-04-29: qty 40

## 2023-04-29 MED ORDER — SODIUM CHLORIDE 0.9 % IV SOLN
800.0000 mg/m2 | Freq: Once | INTRAVENOUS | Status: AC
  Administered 2023-04-29: 1710 mg via INTRAVENOUS
  Filled 2023-04-29: qty 25.98

## 2023-04-29 MED ORDER — SODIUM CHLORIDE 0.9 % IV SOLN: Freq: Once | INTRAVENOUS | Status: AC

## 2023-04-29 MED ORDER — PROCHLORPERAZINE MALEATE 10 MG PO TABS
10.0000 mg | ORAL_TABLET | Freq: Once | ORAL | Status: AC
  Administered 2023-04-29: 10 mg via ORAL
  Filled 2023-04-29: qty 1

## 2023-04-29 NOTE — Patient Instructions (Signed)
[facility]   Discharge Instructions: Thank you for choosing [facility] to provide your oncology and hematology care.   If you have a lab appointment with the Cancer Center, please go directly to the Cancer Center and check in at the registration area.   Wear comfortable clothing and clothing appropriate for easy access to any Portacath or PICC line.   We strive to give you quality time with your provider. You may need to reschedule your appointment if you arrive late (15 or more minutes).  Arriving late affects you and other patients whose appointments are after yours.  Also, if you miss three or more appointments without notifying the office, you may be dismissed from the clinic at the provider's discretion.      For prescription refill requests, have your pharmacy contact our office and allow 72 hours for refills to be completed.    Today you received the following chemotherapy and/or immunotherapy agents Paclitaxel-protein bound (ABRAXANE) & Gemcitabine (GEMZAR).   To help prevent nausea and vomiting after your treatment, we encourage you to take your nausea medication as directed.  BELOW ARE SYMPTOMS THAT SHOULD BE REPORTED IMMEDIATELY: *FEVER GREATER THAN 100.4 F (38 C) OR HIGHER *CHILLS OR SWEATING *NAUSEA AND VOMITING THAT IS NOT CONTROLLED WITH YOUR NAUSEA MEDICATION *UNUSUAL SHORTNESS OF BREATH *UNUSUAL BRUISING OR BLEEDING *URINARY PROBLEMS (pain or burning when urinating, or frequent urination) *BOWEL PROBLEMS (unusual diarrhea, constipation, pain near the anus) TENDERNESS IN MOUTH AND THROAT WITH OR WITHOUT PRESENCE OF ULCERS (sore throat, sores in mouth, or a toothache) UNUSUAL RASH, SWELLING OR PAIN  UNUSUAL VAGINAL DISCHARGE OR ITCHING   Items with * indicate a potential emergency and should be followed up as soon as possible or go to the Emergency Department if any problems should occur.  Please show the CHEMOTHERAPY  ALERT CARD or IMMUNOTHERAPY ALERT CARD at check-in to the Emergency Department and triage nurse.  Should you have questions after your visit or need to cancel or reschedule your appointment, please contact [facility]  Dept: [phone]  and follow the prompts.  Office hours are 8:00 a.m. to 4:30 p.m. Monday - Friday. Please note that voicemails left after 4:00 p.m. may not be returned until the following business day.  We are closed weekends and major holidays. You have access to a nurse at all times for urgent questions. Please call the main number to the clinic Dept: [phone] and follow the prompts.   For any non-urgent questions, you may also contact your provider using MyChart. We now offer e-Visits for anyone 18 and older to request care online for non-urgent symptoms. For details visit [email or web address].   Also download the MyChart app! Go to the app store, search "MyChart", open the app, select [facility], and log in with your MyChart username and password.  Paclitaxel Nanoparticle Albumin-Bound Injection What is this medication? NANOPARTICLE ALBUMIN-BOUND PACLITAXEL (Na no PAHR ti kuhl al BYOO muhn-bound PAK li TAX el) treats some types of cancer. It works by slowing down the growth of cancer cells. This medicine may be used for other purposes; ask your health care provider or pharmacist if you have questions. COMMON BRAND NAME(S): Abraxane What should I tell my care team before I take this medication? They need to know if you have any of these conditions: Liver disease Low white blood cell levels An unusual or allergic reaction to paclitaxel, albumin, other medications, foods, dyes, or preservatives   If you or your partner are pregnant or trying to get pregnant Breast-feeding How should I use this medication? This medication is injected into a vein. It is given by your care team in a hospital or clinic setting. Talk to your care team  about the use of this medication in children. Special care may be needed. Overdosage: If you think you have taken too much of this medicine contact a poison control center or emergency room at once. NOTE: This medicine is only for you. Do not share this medicine with others. What if I miss a dose? Keep appointments for follow-up doses. It is important not to miss your dose. Call your care team if you are unable to keep an appointment. What may interact with this medication? Other medications may affect the way this medication works. Talk with your care team about all of the medications you take. They may suggest changes to your treatment plan to lower the risk of side effects and to make sure your medications work as intended. This list may not describe all possible interactions. Give your health care provider a list of all the medicines, herbs, non-prescription drugs, or dietary supplements you use. Also tell them if you smoke, drink alcohol, or use illegal drugs. Some items may interact with your medicine. What should I watch for while using this medication? Your condition will be monitored carefully while you are receiving this medication. You may need blood work while taking this medication. This medication may make you feel generally unwell. This is not uncommon as chemotherapy can affect healthy cells as well as cancer cells. Report any side effects. Continue your course of treatment even though you feel ill unless your care team tells you to stop. This medication can cause serious allergic reactions. To reduce the risk, your care team may give you other medications to take before receiving this one. Be sure to follow the directions from your care team. This medication may increase your risk of getting an infection. Call your care team for advice if you get a fever, chills, sore throat, or other symptoms of a cold or flu. Do not treat yourself. Try to avoid being around people who are sick. This  medication may increase your risk to bruise or bleed. Call your care team if you notice any unusual bleeding. Be careful brushing or flossing your teeth or using a toothpick because you may get an infection or bleed more easily. If you have any dental work done, tell your dentist you are receiving this medication. Talk to your care team if you or your partner may be pregnant. Serious birth defects can occur if you take this medication during pregnancy and for 6 months after the last dose. You will need a negative pregnancy test before starting this medication. Contraception is recommended while taking this medication and for 6 months after the last dose. Your care team can help you find the option that works for you. If your partner can get pregnant, use a condom during sex while taking this medication and for 3 months after the last dose. Do not breastfeed while taking this medication and for 2 weeks after the last dose. This medication may cause infertility. Talk to your care team if you are concerned about your fertility. What side effects may I notice from receiving this medication? Side effects that you should report to your care team as soon as possible: Allergic reactions--skin rash, itching, hives, swelling of the face, lips, tongue, or throat Dry cough,   shortness of breath or trouble breathing Infection--fever, chills, cough, sore throat, wounds that don't heal, pain or trouble when passing urine, general feeling of discomfort or being unwell Low red blood cell level--unusual weakness or fatigue, dizziness, headache, trouble breathing Pain, tingling, or numbness in the hands or feet Stomach pain, unusual weakness or fatigue, nausea, vomiting, diarrhea, or fever that lasts longer than expected Unusual bruising or bleeding Side effects that usually do not require medical attention (report to your care team if they continue or are bothersome): Diarrhea Fatigue Hair loss Loss of  appetite Nausea Vomiting This list may not describe all possible side effects. Call your doctor for medical advice about side effects. You may report side effects to FDA at 1-800-FDA-1088. Where should I keep my medication? This medication is given in a hospital or clinic. It will not be stored at home. NOTE: This sheet is a summary. It may not cover all possible information. If you have questions about this medicine, talk to your doctor, pharmacist, or health care provider.  2024 Elsevier/Gold Standard (2021-11-05 00:00:00)  Gemcitabine Injection What is this medication? GEMCITABINE (jem SYE ta been) treats some types of cancer. It works by slowing down the growth of cancer cells. This medicine may be used for other purposes; ask your health care provider or pharmacist if you have questions. COMMON BRAND NAME(S): Gemzar, Infugem What should I tell my care team before I take this medication? They need to know if you have any of these conditions: Blood disorders Infection Kidney disease Liver disease Lung or breathing disease, such as asthma or COPD Recent or ongoing radiation therapy An unusual or allergic reaction to gemcitabine, other medications, foods, dyes, or preservatives If you or your partner are pregnant or trying to get pregnant Breast-feeding How should I use this medication? This medication is injected into a vein. It is given by your care team in a hospital or clinic setting. Talk to your care team about the use of this medication in children. Special care may be needed. Overdosage: If you think you have taken too much of this medicine contact a poison control center or emergency room at once. NOTE: This medicine is only for you. Do not share this medicine with others. What if I miss a dose? Keep appointments for follow-up doses. It is important not to miss your dose. Call your care team if you are unable to keep an appointment. What may interact with this  medication? Interactions have not been studied. This list may not describe all possible interactions. Give your health care provider a list of all the medicines, herbs, non-prescription drugs, or dietary supplements you use. Also tell them if you smoke, drink alcohol, or use illegal drugs. Some items may interact with your medicine. What should I watch for while using this medication? Your condition will be monitored carefully while you are receiving this medication. This medication may make you feel generally unwell. This is not uncommon, as chemotherapy can affect healthy cells as well as cancer cells. Report any side effects. Continue your course of treatment even though you feel ill unless your care team tells you to stop. In some cases, you may be given additional medications to help with side effects. Follow all directions for their use. This medication may increase your risk of getting an infection. Call your care team for advice if you get a fever, chills, sore throat, or other symptoms of a cold or flu. Do not treat yourself. Try to avoid being around   people who are sick. This medication may increase your risk to bruise or bleed. Call your care team if you notice any unusual bleeding. Be careful brushing or flossing your teeth or using a toothpick because you may get an infection or bleed more easily. If you have any dental work done, tell your dentist you are receiving this medication. Avoid taking medications that contain aspirin, acetaminophen, ibuprofen, naproxen, or ketoprofen unless instructed by your care team. These medications may hide a fever. Talk to your care team if you or your partner wish to become pregnant or think you might be pregnant. This medication can cause serious birth defects if taken during pregnancy and for 6 months after the last dose. A negative pregnancy test is required before starting this medication. A reliable form of contraception is recommended while taking  this medication and for 6 months after the last dose. Talk to your care team about effective forms of contraception. Do not father a child while taking this medication and for 3 months after the last dose. Use a condom while having sex during this time period. Do not breastfeed while taking this medication and for at least 1 week after the last dose. This medication may cause infertility. Talk to your care team if you are concerned about your fertility. What side effects may I notice from receiving this medication? Side effects that you should report to your care team as soon as possible: Allergic reactions--skin rash, itching, hives, swelling of the face, lips, tongue, or throat Capillary leak syndrome--stomach or muscle pain, unusual weakness or fatigue, feeling faint or lightheaded, decrease in the amount of urine, swelling of the ankles, hands, or feet, trouble breathing Infection--fever, chills, cough, sore throat, wounds that don't heal, pain or trouble when passing urine, general feeling of discomfort or being unwell Liver injury--right upper belly pain, loss of appetite, nausea, light-colored stool, dark yellow or brown urine, yellowing skin or eyes, unusual weakness or fatigue Low red blood cell level--unusual weakness or fatigue, dizziness, headache, trouble breathing Lung injury--shortness of breath or trouble breathing, cough, spitting up blood, chest pain, fever Stomach pain, bloody diarrhea, pale skin, unusual weakness or fatigue, decrease in the amount of urine, which may be signs of hemolytic uremic syndrome Sudden and severe headache, confusion, change in vision, seizures, which may be signs of posterior reversible encephalopathy syndrome (PRES) Unusual bruising or bleeding Side effects that usually do not require medical attention (report to your care team if they continue or are bothersome): Diarrhea Drowsiness Hair loss Nausea Pain, redness, or swelling with sores inside the  mouth or throat Vomiting This list may not describe all possible side effects. Call your doctor for medical advice about side effects. You may report side effects to FDA at 1-800-FDA-1088. Where should I keep my medication? This medication is given in a hospital or clinic. It will not be stored at home. NOTE: This sheet is a summary. It may not cover all possible information. If you have questions about this medicine, talk to your doctor, pharmacist, or health care provider.  2024 Elsevier/Gold Standard (2021-10-27 00:00:00)  

## 2023-04-29 NOTE — Progress Notes (Signed)
[facility] OFFICE PROGRESS NOTE   Diagnosis: Pancreas cancer  INTERVAL HISTORY:   Marcus [name] returns as scheduled.  He completed another cycle of  gemcitabine/Abraxane 04/15/2023.  No nausea or thinking after chemotherapy.  He had mild nausea earlier this morning, resolved now.  He intermittently notes a mouth sore where he bites the inside of his mouth.  No diarrhea.  He had numbness in his feet prior to chemotherapy.  He thinks this may be mildly increased.  In general he felt weaker after the most recent treatment than with previous.  He reports he chronically expectorates thick phlegm and has undergone extensive evaluation of this in the past.  No fever.  Objective:  Vital signs in last 24 hours:  Blood pressure (!) 144/60, pulse 63, temperature 98.1 F (36.7 C), temperature source Temporal, resp. rate 18, weight 216 lb 6.4 oz (98.2 kg), SpO2 99%.    HEENT: No thrush or ulcers. Resp: Lungs clear bilaterally. Cardio: Regular rate and rhythm. GI: No hepatosplenomegaly. Vascular: Trace lower leg edema bilaterally. Neuro: Vibratory sense mildly decreased over the fingertips per tuning fork exam. Port-A-Cath without erythema.  Lab Results:  Lab Results  Component Value Date   WBC 4.1 04/29/2023   HGB 8.9 (L) 04/29/2023   HCT 27.5 (L) 04/29/2023   MCV 87.0 04/29/2023   PLT 201 04/29/2023   NEUTROABS 2.3 04/29/2023    Imaging:  No results found.  Medications: I have reviewed the patient's current medications.  Assessment/Plan: Pancreas cancer CT abdomen/pelvis 12/03/2022-suspected pancreas mass in the proximal body measuring 2 cm with probable mass effect on the superior aspect of the SMV, multiple hypodense liver lesions, many are new from a remote CT-indeterminate, 2 cystic lesions in the pancreas tail-nonspecific prior bariatric surgery ERCP 12/13/2022-malignant appearing common bile duct stricture, plastic stent placed, brushing cytology-no malignant  cells EUS 12/23/2022-30 x 33 mm pancreas head mass with invasion of the SMV, no malignant appearing lymph nodes, 4 cysts in the visualized liver, FNA-adenocarcinoma Cycle 1 gemcitabine and abraxane 01/07/2023 CTs 01/09/2023-multiple low-attenuation liver lesions unchanged, largest of which are clearly benign fluid attenuation cyst, others more ill-defined and incompletely characterized suspicious for small metastases.  Similar appearance of hypodense mass in the pancreatic head.  Distended gallbladder with mild gallbladder wall thickening and adjacent pericholecystic fat stranding.  Sludge in the gallbladder fundus.  Common bile duct stent in place with diminished, persistent intrahepatic biliary ductal dilatation.  Negative for pulmonary embolism.  Acute appearing heterogeneous airspace opacity of the superior segment left lower lobe consistent with infection or aspiration. Cycle 2 gemcitabine and abraxane 01/21/2023, gemcitabine dose reduced due to neutropenia following cycle 1 Cycle 3 gemcitabine/Abraxane 02/04/2023 Cycle 4 gemcitabine/Abraxane 02/17/2023 Cycle 5 gemcitabine/Abraxane 03/04/2023 Cycle 6 gemcitabine/Abraxane 03/17/2023 CT abdomen/pelvis 03/28/2023-stable pancreas mass, multiple small hypoattenuating liver lesions (review of CTs at the GI tumor conference is most consistent with liver metastases-some smaller compared to the baseline CT) Cycle 7 gemcitabine/Abraxane 04/01/2023 Cycle 8 gemcitabine/Abraxane 04/15/2023 Cycle 9 gemcitabine/Abraxane 04/29/2023 Biliary obstruction secondary to 1-status post ERCP/stent placement 12/13/2022 Coronary artery disease Atrial fibrillation Gout Sleep apnea Status post bariatric surgery Hypertension Bronchiectasis Gastroesophageal reflux disease History of colon cancer in his 40s Complete heart block-pacemaker placed Admission 01/09/2023 with a fever and failure to thrive, CT chest with evidence of left lung pneumonia, CT abdomen/pelvis with a distended  gallbladder and adjacent fat stranding Anemia      Disposition: Marcus [name] appears stable.  He has completed 8 cycles of gemcitabine/Abraxane.  Overall he  seems to be tolerating treatment well.  There is no clinical evidence of disease progression.  Most recent CA 19-9 was stable.  We will follow-up on the value from today.  Plan to proceed with cycle 9 gemcitabine/Abraxane today as scheduled.  CBC and chemistry panel reviewed.  Labs adequate to proceed as above.  He will return for follow-up and treatment as scheduled in 2 weeks.    Lonna Cobb ANP/GNP-BC   04/29/2023  9:02 AM

## 2023-04-29 NOTE — Progress Notes (Signed)
Patient seen by Lonna Cobb NP today  Vitals are within treatment parameters:Yes   Labs are within treatment parameters: Yes   Treatment plan has been signed: Yes   Per physician team, Patient is ready for treatment and there are NO modifications to the treatment plan.

## 2023-05-01 LAB — CANCER ANTIGEN 19-9: CA 19-9: 688 U/mL — ABNORMAL HIGH (ref 0–35)

## 2023-05-02 DIAGNOSIS — R2689 Other abnormalities of gait and mobility: Secondary | ICD-10-CM | POA: Diagnosis not present

## 2023-05-02 DIAGNOSIS — Z9181 History of falling: Secondary | ICD-10-CM | POA: Diagnosis not present

## 2023-05-02 DIAGNOSIS — M6281 Muscle weakness (generalized): Secondary | ICD-10-CM | POA: Diagnosis not present

## 2023-05-02 DIAGNOSIS — R262 Difficulty in walking, not elsewhere classified: Secondary | ICD-10-CM | POA: Diagnosis not present

## 2023-05-03 ENCOUNTER — Telehealth: Payer: Self-pay | Admitting: Dietician

## 2023-05-03 ENCOUNTER — Telehealth: Payer: Self-pay | Admitting: *Deleted

## 2023-05-03 NOTE — Telephone Encounter (Signed)
Patient screened on MST. First attempt to reach. He was traveling in car and said he'd prefer to talk when he returns. Provided my cell# in a text to set up a remote nutrition consult.  Gennaro Africa, RDN, LDN Registered Dietitian, [facility] Part Time Remote (Usual office hours: Tuesday-Thursday) Cell: [phone]

## 2023-05-03 NOTE — Telephone Encounter (Signed)
Spoke w/Marcus [name] about his CA 19.9 and that MD is higher. Informed him that Dr. Truett Perna is not concerned with increase. His pattern has been up and down and the CT scan was good. F/U as scheduled.

## 2023-05-07 ENCOUNTER — Other Ambulatory Visit (HOSPITAL_BASED_OUTPATIENT_CLINIC_OR_DEPARTMENT_OTHER): Payer: Self-pay

## 2023-05-08 ENCOUNTER — Other Ambulatory Visit: Payer: Self-pay | Admitting: Oncology

## 2023-05-09 ENCOUNTER — Encounter: Payer: Self-pay | Admitting: Oncology

## 2023-05-09 ENCOUNTER — Other Ambulatory Visit (HOSPITAL_BASED_OUTPATIENT_CLINIC_OR_DEPARTMENT_OTHER): Payer: Self-pay

## 2023-05-12 ENCOUNTER — Inpatient Hospital Stay: Payer: No Typology Code available for payment source | Attending: Oncology

## 2023-05-12 ENCOUNTER — Telehealth: Payer: Self-pay | Admitting: Dietician

## 2023-05-12 ENCOUNTER — Inpatient Hospital Stay: Payer: No Typology Code available for payment source | Admitting: Nurse Practitioner

## 2023-05-12 ENCOUNTER — Encounter: Payer: Self-pay | Admitting: Nurse Practitioner

## 2023-05-12 ENCOUNTER — Inpatient Hospital Stay: Payer: No Typology Code available for payment source

## 2023-05-12 DIAGNOSIS — C259 Malignant neoplasm of pancreas, unspecified: Secondary | ICD-10-CM

## 2023-05-12 DIAGNOSIS — K831 Obstruction of bile duct: Secondary | ICD-10-CM | POA: Diagnosis not present

## 2023-05-12 DIAGNOSIS — Z85038 Personal history of other malignant neoplasm of large intestine: Secondary | ICD-10-CM | POA: Diagnosis not present

## 2023-05-12 DIAGNOSIS — Z5111 Encounter for antineoplastic chemotherapy: Secondary | ICD-10-CM | POA: Insufficient documentation

## 2023-05-12 DIAGNOSIS — M109 Gout, unspecified: Secondary | ICD-10-CM | POA: Diagnosis not present

## 2023-05-12 DIAGNOSIS — C250 Malignant neoplasm of head of pancreas: Secondary | ICD-10-CM | POA: Diagnosis not present

## 2023-05-12 DIAGNOSIS — D649 Anemia, unspecified: Secondary | ICD-10-CM | POA: Insufficient documentation

## 2023-05-12 DIAGNOSIS — I4891 Unspecified atrial fibrillation: Secondary | ICD-10-CM | POA: Insufficient documentation

## 2023-05-12 DIAGNOSIS — K219 Gastro-esophageal reflux disease without esophagitis: Secondary | ICD-10-CM | POA: Diagnosis not present

## 2023-05-12 DIAGNOSIS — G4730 Sleep apnea, unspecified: Secondary | ICD-10-CM | POA: Insufficient documentation

## 2023-05-12 DIAGNOSIS — I10 Essential (primary) hypertension: Secondary | ICD-10-CM | POA: Insufficient documentation

## 2023-05-12 DIAGNOSIS — I442 Atrioventricular block, complete: Secondary | ICD-10-CM | POA: Insufficient documentation

## 2023-05-12 DIAGNOSIS — I2510 Atherosclerotic heart disease of native coronary artery without angina pectoris: Secondary | ICD-10-CM | POA: Diagnosis not present

## 2023-05-12 DIAGNOSIS — J479 Bronchiectasis, uncomplicated: Secondary | ICD-10-CM | POA: Insufficient documentation

## 2023-05-12 DIAGNOSIS — G629 Polyneuropathy, unspecified: Secondary | ICD-10-CM | POA: Insufficient documentation

## 2023-05-12 DIAGNOSIS — Z9884 Bariatric surgery status: Secondary | ICD-10-CM | POA: Insufficient documentation

## 2023-05-12 LAB — CMP (CANCER CENTER ONLY)
ALT: 16 U/L (ref 0–44)
AST: 16 U/L (ref 15–41)
Albumin: 3.3 g/dL — ABNORMAL LOW (ref 3.5–5.0)
Alkaline Phosphatase: 61 U/L (ref 38–126)
Anion gap: 7 (ref 5–15)
BUN: 24 mg/dL — ABNORMAL HIGH (ref 8–23)
CO2: 26 mmol/L (ref 22–32)
Calcium: 9.1 mg/dL (ref 8.9–10.3)
Chloride: 108 mmol/L (ref 98–111)
Creatinine: 1.14 mg/dL (ref 0.61–1.24)
GFR, Estimated: >60 mL/min (ref >60)
Glucose, Bld: 129 mg/dL — ABNORMAL HIGH (ref 70–99)
Potassium: 3.8 mmol/L (ref 3.5–5.1)
Sodium: 141 mmol/L (ref 135–145)
Total Bilirubin: 0.3 mg/dL (ref <1.2)
Total Protein: 5.9 g/dL — ABNORMAL LOW (ref 6.5–8.1)

## 2023-05-12 LAB — CBC WITH DIFFERENTIAL (CANCER CENTER ONLY)
Abs Immature Granulocytes: 0.06 10*3/uL (ref 0.00–0.07)
Basophils Absolute: 0 10*3/uL (ref 0.0–0.1)
Basophils Relative: 0 %
Eosinophils Absolute: 0.1 10*3/uL (ref 0.0–0.5)
Eosinophils Relative: 2 %
HCT: 27.7 % — ABNORMAL LOW (ref 39.0–52.0)
Hemoglobin: 8.7 g/dL — ABNORMAL LOW (ref 13.0–17.0)
Immature Granulocytes: 2 %
Lymphocytes Relative: 25 %
Lymphs Abs: 0.8 10*3/uL (ref 0.7–4.0)
MCH: 27.6 pg (ref 26.0–34.0)
MCHC: 31.4 g/dL (ref 30.0–36.0)
MCV: 87.9 fL (ref 80.0–100.0)
Monocytes Absolute: 0.4 10*3/uL (ref 0.1–1.0)
Monocytes Relative: 13 %
Neutro Abs: 2 10*3/uL (ref 1.7–7.7)
Neutrophils Relative %: 58 %
Platelet Count: 183 10*3/uL (ref 150–400)
RBC: 3.15 MIL/uL — ABNORMAL LOW (ref 4.22–5.81)
RDW: 20.7 % — ABNORMAL HIGH (ref 11.5–15.5)
WBC Count: 3.4 10*3/uL — ABNORMAL LOW (ref 4.0–10.5)
nRBC: 0 % (ref 0.0–0.2)

## 2023-05-12 MED ORDER — SODIUM CHLORIDE 0.9% FLUSH
10.0000 mL | INTRAVENOUS | Status: DC | PRN
  Administered 2023-05-12: 10 mL via INTRAVENOUS

## 2023-05-12 MED ORDER — HEPARIN SOD (PORK) LOCK FLUSH 100 UNIT/ML IV SOLN
500.0000 [IU] | Freq: Once | INTRAVENOUS | Status: AC
  Administered 2023-05-12: 500 [IU] via INTRAVENOUS

## 2023-05-12 NOTE — Progress Notes (Signed)
Patient seen by Lonna Cobb NP today  Vitals are within treatment parameters:Yes   Labs are within treatment parameters: Yes   Treatment plan has been signed: Yes   Per physician team, Patient will not be receiving treatment today.

## 2023-05-12 NOTE — Telephone Encounter (Signed)
Patient screened on MST. Second attempt to reach. He picked up call, said he was not able to speak freely as he was "in middle of his chemo treatment" prefers to talk when he is at home. However, notes reflect that he decided not to go through with his treatment today.  Will make final attempt to reach next week for  remote nutrition consult when he has no other appointments.  Gennaro Africa, RDN, LDN Registered Dietitian, [facility] Part Time Remote (Usual office hours: Tuesday-Thursday) Cell: [phone]

## 2023-05-12 NOTE — Addendum Note (Signed)
Addended by: Dimitri Ped on: 05/12/2023 11:47 AM   Modules accepted: Orders

## 2023-05-12 NOTE — Progress Notes (Signed)
[facility] OFFICE PROGRESS NOTE   Diagnosis: Pancreas cancer  INTERVAL HISTORY:   Marcus [name] returns as scheduled.  He completed another cycle of gemcitabine/Abraxane 04/29/2023.  He does not feel well in general for several days after treatment.  He does not typically experience nausea/vomiting, mouth sores, diarrhea.  No fever or rash.  Stable neuropathy symptoms in the feet predating chemotherapy.  No abdominal pain.  He has a good appetite.  He is gaining weight.  Objective:  Vital signs in last 24 hours:  Blood pressure 138/66, pulse 60, temperature 98.1 F (36.7 C), temperature source Temporal, resp. rate 18, height 5\' 8"  (1.727 m), weight 221 lb 12.8 oz (100.6 kg), SpO2 99%.    HEENT: No thrush or ulcers. Resp: Lungs clear bilaterally. Cardio: Regular rate and rhythm. GI: No hepatosplenomegaly. Vascular: Trace lower leg edema bilaterally. Skin: No rash. Port-A-Cath without erythema.  Lab Results:  Lab Results  Component Value Date   WBC 3.4 (L) 05/12/2023   HGB 8.7 (L) 05/12/2023   HCT 27.7 (L) 05/12/2023   MCV 87.9 05/12/2023   PLT 183 05/12/2023   NEUTROABS 2.0 05/12/2023    Imaging:  No results found.  Medications: I have reviewed the patient's current medications.  Assessment/Plan: Pancreas cancer CT abdomen/pelvis 12/03/2022-suspected pancreas mass in the proximal body measuring 2 cm with probable mass effect on the superior aspect of the SMV, multiple hypodense liver lesions, many are new from a remote CT-indeterminate, 2 cystic lesions in the pancreas tail-nonspecific prior bariatric surgery ERCP 12/13/2022-malignant appearing common bile duct stricture, plastic stent placed, brushing cytology-no malignant cells EUS 12/23/2022-30 x 33 mm pancreas head mass with invasion of the SMV, no malignant appearing lymph nodes, 4 cysts in the visualized liver, FNA-adenocarcinoma Cycle 1 gemcitabine and abraxane 01/07/2023 CTs 01/09/2023-multiple  low-attenuation liver lesions unchanged, largest of which are clearly benign fluid attenuation cyst, others more ill-defined and incompletely characterized suspicious for small metastases.  Similar appearance of hypodense mass in the pancreatic head.  Distended gallbladder with mild gallbladder wall thickening and adjacent pericholecystic fat stranding.  Sludge in the gallbladder fundus.  Common bile duct stent in place with diminished, persistent intrahepatic biliary ductal dilatation.  Negative for pulmonary embolism.  Acute appearing heterogeneous airspace opacity of the superior segment left lower lobe consistent with infection or aspiration. Cycle 2 gemcitabine and abraxane 01/21/2023, gemcitabine dose reduced due to neutropenia following cycle 1 Cycle 3 gemcitabine/Abraxane 02/04/2023 Cycle 4 gemcitabine/Abraxane 02/17/2023 Cycle 5 gemcitabine/Abraxane 03/04/2023 Cycle 6 gemcitabine/Abraxane 03/17/2023 CT abdomen/pelvis 03/28/2023-stable pancreas mass, multiple small hypoattenuating liver lesions (review of CTs at the GI tumor conference is most consistent with liver metastases-some smaller compared to the baseline CT) Cycle 7 gemcitabine/Abraxane 04/01/2023 Cycle 8 gemcitabine/Abraxane 04/15/2023 Cycle 9 gemcitabine/Abraxane 04/29/2023 Treatment held per patient request 05/12/2023 Biliary obstruction secondary to 1-status post ERCP/stent placement 12/13/2022 Coronary artery disease Atrial fibrillation Gout Sleep apnea Status post bariatric surgery Hypertension Bronchiectasis Gastroesophageal reflux disease History of colon cancer in his 40s Complete heart block-pacemaker placed Admission 01/09/2023 with a fever and failure to thrive, CT chest with evidence of left lung pneumonia, CT abdomen/pelvis with a distended gallbladder and adjacent fat stranding Anemia      Disposition: Mr. Perin appears stable.  He has completed 9 cycles of gemcitabine/Abraxane.  The CA 19-9 from 04/29/2023 was higher.   He is very concerned this indicates disease progression.  He declines chemotherapy today pending the CA 19-9 result.  If the value is higher he would like to proceed  with restaging CTs.  If stable or improved he will agree to continue chemotherapy.  We will contact him with the result from today.  We reviewed the CBC and chemistry panel.  Labs are stable.  He would like to keep his next scheduled follow-up appointment in 2 weeks.  We will be in touch with him sooner regarding the CA 19-9.  Plan reviewed with Dr. Truett Perna.  Lonna Cobb ANP/GNP-BC   05/12/2023  10:45 AM

## 2023-05-12 NOTE — Patient Instructions (Signed)
 [employer], Adult A central line is a long, thin tube (catheter) that can be used to collect blood for testing or to give medicine through a vein. The tip of the central line ends in a large vein just above the heart (vena cava). A central line may be placed because: You need to get medicines or fluids through an IV for a long period of time. You need nutrition but cannot eat or absorb nutrients. The veins in your hands or arms are difficult to use for IV access. You need a blood transfusion. You need chemotherapy or dialysis. Types of central lines There are four main types of central lines: Peripherally inserted central catheter (PICC) line. This type is used for access of one week or longer. It can be used to draw blood and give fluids or medicines. A PICC looks like an IV tube, but it goes up the arm to the heart. It is usually inserted in the upper arm and taped in place on the arm. Tunneled central line. This type is used for long-term therapy and dialysis. It is placed in a large vein in the neck, chest, or groin. It is inserted through a small incision made over the vein, and then it is advanced to the heart. It is tunneled under the skin and brought out through a second incision. Non-tunneled central line. This type is used for short-term access, usually for a maximum of 7 days. It is often used in the emergency department. It is inserted in the neck, chest, or groin. Implanted port. This type is used for long-term therapy. It can stay in place longer than other types of central lines. It is normally inserted in the upper chest, but it can also be placed in the upper arm or the abdomen. It is inserted and removed with surgery, and it is accessed using a needle. The type of central line that you receive depends on how long you will need it, your medical condition, and the condition of your veins. Tell a health care provider about: Any allergies you have. All medicines you are taking,  including vitamins, herbs, eye drops, creams, and over-the-counter medicines. Any problems you or family members have had with anesthetic medicines. Any blood disorders you have. Any surgeries you have had. Any medical conditions you have. Whether you are pregnant or may be pregnant. What are the risks? Generally, placement and use of a central line is safe. However, problems may occur, including: Infection. A blood clot that blocks the central line or forms in the vein and travels to the heart. Bleeding from the place where the central line was inserted. Developing a hole or crack within the central line. If this happens, the central line will need to be replaced. Central line failure. The catheter moving or coming out of place. What happens before the procedure? Medicines Ask your health care provider about: Changing or stopping your regular medicines. This is especially important if you are taking diabetes medicines or blood thinners. Taking medicines such as aspirin and ibuprofen. These medicines can thin your blood. Do not take these medicines unless your health care provider tells you to take them. Taking over-the-counter medicines, vitamins, herbs, and supplements. General instructions Follow instructions from your health care provider about eating or drinking restrictions. Ask your health care provider: How your procedure site will be marked. What steps will be taken to help prevent infection. These steps may include: Removing hair at the procedure site. Washing skin with a germ-killing soap.  Plan to have a responsible adult take you home from the hospital or clinic. If you will be going home right after the procedure, plan to have a responsible adult care for you for the time you are told. This is important. What happens during the procedure? The procedure will vary depending on the type of central line being placed. In general: An IV will be inserted into one of your  veins. You will be given one or more of the following: A medicine to help you relax (sedative). A medicine to numb the area (local anesthetic). Your skin will be cleaned with a germ-killing (antiseptic) solution, and you may be covered with sterile drapes. Your blood pressure, heart rate, breathing rate, and blood oxygen level will be monitored during the procedure. The central line catheter will be inserted into the vein and advanced to the correct spot. The health care provider may use X-ray equipment to help guide the catheter to the right place. A bandage (dressing) will be placed over the insertion area. The procedure may vary among health care providers and hospitals. What can I expect after the procedure? Your blood pressure, heart rate, breathing rate, and blood oxygen level will be monitored until you leave the hospital or clinic. Antiseptic caps may be placed on the ends of the central line tubing. If you were given a sedative during the procedure, it can affect you for several hours. Do not drive or operate machinery until your health care provider says that it is safe. Follow these instructions at home: Flushing and cleaning the central line  Follow instructions from your health care provider about flushing and cleaning the central line and the area around it. Only use sterile supplies to flush the central line. Use supplies from your health care provider, a pharmacy, or another source that is recommended by your health care provider. Before you flush the central line or clean the central line or the area around it: Wash your hands with soap and water for at least 20 seconds. If soap and water are not available, use alcohol-based hand sanitizer. Clean the central line hub with rubbing alcohol. Unless directed otherwise by the manufacturer's instructions, scrub using a twisting motion and rub for 10 to 15 seconds or for 30 twists. Be sure you scrub the top of the hub, not just the  sides. Never reuse alcohol pads. Let the hub dry before use. Prevent it from touching anything while drying. Caring for the incision or central line site Check your incision or central line site every day for signs of infection. Check for: Redness, swelling, or pain. Fluid or blood. Warmth. Pus or a bad smell. Keep the insertion site of your central line clean and dry at all times. Change your dressing only as told by your health care provider. Keep your dressing dry. If it gets wet, have it changed as soon as possible. General instructions Follow instructions from your health care provider for the type of device that you have. Keep the tube clamped, unless it is being used. If the central line accidentally gets pulled on, make sure: The dressing is okay. There is no bleeding. The line has not been pulled out. Do not use scissors or sharp objects near the tube. Do not take baths, swim, or use a hot tub until your health care provider approves. Ask your health care provider if you may take showers. You may only be allowed to take sponge baths. Ask your health care provider what activities are  safe for you. You may be restricted from lifting or making repetitive arm movements on the side of your central line. Take over-the-counter and prescription medicines only as told by your health care provider. Keep all follow-up visits. This is important. Storage and disposal of supplies Keep your supplies in a clean, dry location. Throw away any used syringes in a disposal container that is meant for sharp items (sharps container). You can buy a sharps container from a pharmacy, or you can make one by using an empty hard plastic bottle with a cover. Place any used dressings or infusion bags into a plastic bag. Throw that bag in the trash. Contact a health care provider if: You have redness, swelling, or pain around your insertion site. You have fluid or blood coming from your insertion site. Your  insertion site feels warm to the touch. You have pus or a bad smell coming from your insertion site. Get help right away if: You have: A fever or chills. Shortness of breath. Chest pain or a racing heartbeat. Swelling in your neck, face, chest, or arm on the side of your central line. You feel dizzy or you faint. Your incision or central line site has red streaks spreading away from the area. Your incision or central line site is bleeding and does not stop. Your central line is difficult to flush or will not flush. You do not get a blood return from the central line. Your central line gets loose or damaged or comes out. Your catheter leaks when flushed or when fluids are infused into it. Summary A central line is a long, thin tube (catheter) that can be used to give medicine through a vein. Follow specific instructions from your health care provider for the type of device that you have. Keep the insertion site of your central line clean and dry at all times. Keep the tube clamped, unless it is being used. This information is not intended to replace advice given to you by your health care provider. Make sure you discuss any questions you have with your health care provider. Document Revised: 02/20/2020 Document Reviewed: 02/21/2020 Elsevier Patient Education  2024 [employer].

## 2023-05-16 ENCOUNTER — Ambulatory Visit: Payer: Medicare Other

## 2023-05-16 DIAGNOSIS — I480 Paroxysmal atrial fibrillation: Secondary | ICD-10-CM

## 2023-05-16 DIAGNOSIS — I4430 Unspecified atrioventricular block: Secondary | ICD-10-CM

## 2023-05-17 ENCOUNTER — Other Ambulatory Visit (HOSPITAL_BASED_OUTPATIENT_CLINIC_OR_DEPARTMENT_OTHER): Payer: Self-pay

## 2023-05-18 ENCOUNTER — Telehealth: Payer: Self-pay | Admitting: Dietician

## 2023-05-18 LAB — CUP PACEART REMOTE DEVICE CHECK
Date Time Interrogation Session: 20241111084430
Implantable Lead Connection Status: 753985
Implantable Lead Connection Status: 753985
Implantable Lead Implant Date: 20200129
Implantable Lead Implant Date: 20200129
Implantable Lead Location: 753859
Implantable Lead Location: 753860
Implantable Lead Model: 377
Implantable Lead Model: 377
Implantable Lead Serial Number: 80947537
Implantable Lead Serial Number: 80970966
Implantable Pulse Generator Implant Date: 20200129
Pulse Gen Model: 407145
Pulse Gen Serial Number: 69503797

## 2023-05-18 NOTE — Telephone Encounter (Signed)
Third and final attempt to reach patient by telephone. Have left messages with return number. Please consult RD for future needs.    April Manson, RDN, LDN Registered Dietitian, [place] Part Time Remote (Usual office hours: Tuesday-Thursday) Mobile: [phone] Remote Office: [phone]

## 2023-05-19 ENCOUNTER — Telehealth: Payer: Self-pay | Admitting: Nurse Practitioner

## 2023-05-19 LAB — CANCER ANTIGEN 19-9: CA 19-9: 690

## 2023-05-19 NOTE — Telephone Encounter (Signed)
I contacted [name] with the CA 19-9 result (690) from 05/12/2023.  This is stable as compared to the most recent CA 19-9 of 688 on 04/29/2023.  He would like to return as scheduled on 05/27/2023 to proceed with treatment.

## 2023-05-22 ENCOUNTER — Other Ambulatory Visit: Payer: Self-pay | Admitting: Oncology

## 2023-05-22 DIAGNOSIS — C259 Malignant neoplasm of pancreas, unspecified: Secondary | ICD-10-CM

## 2023-05-23 ENCOUNTER — Telehealth: Payer: Self-pay | Admitting: Adult Health

## 2023-05-23 NOTE — Telephone Encounter (Signed)
Ally with [employer]  [phone] Calling to F/U on fax sent last week.  Fax was confirmed as received, by NR. Please have NP complete, sign, and return, Asap. Ally informed NP is OOO on Mondays.

## 2023-05-24 NOTE — Telephone Encounter (Signed)
Marcus [name] called back to say she just needs to know if they should close it out or not, since she has not gotten a response? Please return her call, at your earliest convenience.

## 2023-05-24 NOTE — Telephone Encounter (Signed)
Spoke to pt to see if this is something he was wanting. Pt stated that he don't remember talking to anyone from this company. Called [employer] company and advised to remove pt. representative verbalized understating.

## 2023-05-26 ENCOUNTER — Encounter: Payer: Self-pay | Admitting: Oncology

## 2023-05-26 NOTE — Telephone Encounter (Signed)
Telephone call  

## 2023-05-27 ENCOUNTER — Inpatient Hospital Stay: Payer: No Typology Code available for payment source

## 2023-05-27 ENCOUNTER — Inpatient Hospital Stay: Payer: No Typology Code available for payment source | Admitting: Oncology

## 2023-05-27 ENCOUNTER — Inpatient Hospital Stay (HOSPITAL_BASED_OUTPATIENT_CLINIC_OR_DEPARTMENT_OTHER): Payer: No Typology Code available for payment source | Admitting: Oncology

## 2023-05-27 VITALS — BP 142/68 | HR 60 | Temp 98.2°F | Resp 18 | Ht 68.0 in | Wt 218.6 lb

## 2023-05-27 VITALS — BP 168/74 | HR 60 | Resp 18

## 2023-05-27 DIAGNOSIS — C259 Malignant neoplasm of pancreas, unspecified: Secondary | ICD-10-CM | POA: Diagnosis not present

## 2023-05-27 DIAGNOSIS — Z5111 Encounter for antineoplastic chemotherapy: Secondary | ICD-10-CM | POA: Diagnosis not present

## 2023-05-27 LAB — CMP (CANCER CENTER ONLY)
ALT: 12 U/L (ref 0–44)
AST: 16 U/L (ref 15–41)
Albumin: 3.5 g/dL (ref 3.5–5.0)
Alkaline Phosphatase: 57 U/L (ref 38–126)
Anion gap: 5 (ref 5–15)
BUN: 31 mg/dL — ABNORMAL HIGH (ref 8–23)
CO2: 27 mmol/L (ref 22–32)
Calcium: 9.2 mg/dL (ref 8.9–10.3)
Chloride: 107 mmol/L (ref 98–111)
Creatinine: 0.94 mg/dL (ref 0.61–1.24)
GFR, Estimated: >60 mL/min (ref >60)
Glucose, Bld: 134 mg/dL — ABNORMAL HIGH (ref 70–99)
Potassium: 3.7 mmol/L (ref 3.5–5.1)
Sodium: 139 mmol/L (ref 135–145)
Total Bilirubin: 0.3 mg/dL (ref <1.2)
Total Protein: 6.2 g/dL — ABNORMAL LOW (ref 6.5–8.1)

## 2023-05-27 LAB — CBC WITH DIFFERENTIAL (CANCER CENTER ONLY)
Abs Immature Granulocytes: 0.04 10*3/uL (ref 0.00–0.07)
Basophils Absolute: 0 10*3/uL (ref 0.0–0.1)
Basophils Relative: 0 %
Eosinophils Absolute: 0.1 10*3/uL (ref 0.0–0.5)
Eosinophils Relative: 2 %
HCT: 30.1 % — ABNORMAL LOW (ref 39.0–52.0)
Hemoglobin: 9.4 g/dL — ABNORMAL LOW (ref 13.0–17.0)
Immature Granulocytes: 1 %
Lymphocytes Relative: 21 %
Lymphs Abs: 1 10*3/uL (ref 0.7–4.0)
MCH: 28.1 pg (ref 26.0–34.0)
MCHC: 31.2 g/dL (ref 30.0–36.0)
MCV: 89.9 fL (ref 80.0–100.0)
Monocytes Absolute: 0.4 10*3/uL (ref 0.1–1.0)
Monocytes Relative: 9 %
Neutro Abs: 3.2 10*3/uL (ref 1.7–7.7)
Neutrophils Relative %: 67 %
Platelet Count: 215 10*3/uL (ref 150–400)
RBC: 3.35 MIL/uL — ABNORMAL LOW (ref 4.22–5.81)
RDW: 20 % — ABNORMAL HIGH (ref 11.5–15.5)
WBC Count: 4.8 10*3/uL (ref 4.0–10.5)
nRBC: 0 % (ref 0.0–0.2)

## 2023-05-27 MED ORDER — PROCHLORPERAZINE MALEATE 10 MG PO TABS
10.0000 mg | ORAL_TABLET | Freq: Once | ORAL | Status: AC
  Administered 2023-05-27: 10 mg via ORAL
  Filled 2023-05-27: qty 1

## 2023-05-27 MED ORDER — SODIUM CHLORIDE 0.9 % IV SOLN
800.0000 mg/m2 | Freq: Once | INTRAVENOUS | Status: AC
Start: 2023-05-27 — End: 2023-05-27
  Administered 2023-05-27: 1710 mg via INTRAVENOUS
  Filled 2023-05-27: qty 25.98

## 2023-05-27 MED ORDER — SODIUM CHLORIDE 0.9% FLUSH
10.0000 mL | INTRAVENOUS | Status: DC | PRN
  Administered 2023-05-27: 10 mL

## 2023-05-27 MED ORDER — HEPARIN SOD (PORK) LOCK FLUSH 100 UNIT/ML IV SOLN
500.0000 [IU] | Freq: Once | INTRAVENOUS | Status: AC | PRN
Start: 2023-05-27 — End: 2023-05-27
  Administered 2023-05-27: 500 [IU]

## 2023-05-27 MED ORDER — PACLITAXEL PROTEIN-BOUND CHEMO INJECTION 100 MG
100.0000 mg/m2 | Freq: Once | INTRAVENOUS | Status: AC
Start: 2023-05-27 — End: 2023-05-27
  Administered 2023-05-27: 200 mg via INTRAVENOUS
  Filled 2023-05-27: qty 40

## 2023-05-27 MED ORDER — SODIUM CHLORIDE 0.9 % IV SOLN: Freq: Once | INTRAVENOUS | Status: AC

## 2023-05-27 NOTE — Progress Notes (Signed)
[facility] OFF[facility] PRO[facility]ESS [facility]   Diagnosis: Pancreas cancer  INTERVAL HISTORY:   Marcus [name] returns as scheduled.  He completed another cycle of gemcitabine/Abraxane on 04/29/2023.  No rash.  Good appetite.  He generally feels well.  He has chronic neuropathy symptoms in the feet.  This has not changed since beginning chemotherapy.  Objective:  Vital signs in last 24 hours:  Blood pressure (!) 142/68, pulse 60, temperature 98.2 F (36.8 C), temperature source Temporal, resp. rate 18, height 5\' 8"  (1.727 m), weight 218 lb 9.6 oz (99.2 kg), SpO2 100%.    HEENT: No thrush or ulcers Resp: Lungs clear bilaterally Cardio: Regular rate and rhythm GI: No hepatosplenomegaly, no mass, nontender Vascular: Trace edema at the right greater than left lower leg    Portacath/PICC-without erythema  Lab Results:  Lab Results  Component Value Date   WBC 4.8 05/27/2023   HGB 9.4 (L) 05/27/2023   HCT 30.1 (L) 05/27/2023   MCV 89.9 05/27/2023   PLT 215 05/27/2023   NEUTROABS 3.2 05/27/2023    CMP  Lab Results  Component Value Date   NA 141 05/12/2023   K 3.8 05/12/2023   CL 108 05/12/2023   CO2 26 05/12/2023   GLUCOSE 129 (H) 05/12/2023   BUN 24 (H) 05/12/2023   CREATININE 1.14 05/12/2023   CALCIUM 9.1 05/12/2023   PROT 5.9 (L) 05/12/2023   ALBUMIN 3.3 (L) 05/12/2023   AST 16 05/12/2023   ALT 16 05/12/2023   ALKPHOS 61 05/12/2023   BILITOT 0.3 05/12/2023   GFRNONAA >60 05/12/2023   GFRAA >60 02/07/2019    Lab Results  Component Value Date   CEA1 6.4 (H) 12/11/2022   CEA 1.1 03/03/2010   [id number] 05/12/2023    Medications: I have reviewed the patient's current medications.   Assessment/Plan: Pancreas cancer CT abdomen/pelvis 12/03/2022-suspected pancreas mass in the proximal body measuring 2 cm with probable mass effect on the superior aspect of the SMV, multiple hypodense liver lesions, many are new from a remote CT-indeterminate, 2 cystic lesions  in the pancreas tail-nonspecific prior bariatric surgery ERCP 12/13/2022-malignant appearing common bile duct stricture, plastic stent placed, brushing cytology-no malignant cells EUS 12/23/2022-30 x 33 mm pancreas head mass with invasion of the SMV, no malignant appearing lymph nodes, 4 cysts in the visualized liver, FNA-adenocarcinoma Cycle 1 gemcitabine and abraxane 01/07/2023 CTs 01/09/2023-multiple low-attenuation liver lesions unchanged, largest of which are clearly benign fluid attenuation cyst, others more ill-defined and incompletely characterized suspicious for small metastases.  Similar appearance of hypodense mass in the pancreatic head.  Distended gallbladder with mild gallbladder wall thickening and adjacent pericholecystic fat stranding.  Sludge in the gallbladder fundus.  Common bile duct stent in place with diminished, persistent intrahepatic biliary ductal dilatation.  Negative for pulmonary embolism.  Acute appearing heterogeneous airspace opacity of the superior segment left lower lobe consistent with infection or aspiration. Cycle 2 gemcitabine and abraxane 01/21/2023, gemcitabine dose reduced due to neutropenia following cycle 1 Cycle 3 gemcitabine/Abraxane 02/04/2023 Cycle 4 gemcitabine/Abraxane 02/17/2023 Cycle 5 gemcitabine/Abraxane 03/04/2023 Cycle 6 gemcitabine/Abraxane 03/17/2023 CT abdomen/pelvis 03/28/2023-stable pancreas mass, multiple small hypoattenuating liver lesions (review of CTs at the GI tumor conference is most consistent with liver metastases-some smaller compared to the baseline CT) Cycle 7 gemcitabine/Abraxane 04/01/2023 Cycle 8 gemcitabine/Abraxane 04/15/2023 Cycle 9 gemcitabine/Abraxane 04/29/2023 Treatment held per patient request 05/12/2023 Cycle 10 gemcitabine/Abraxane 05/27/2023 Biliary obstruction secondary to 1-status post ERCP/stent placement 12/13/2022 Coronary artery disease Atrial fibrillation Gout Sleep apnea Status post  bariatric  surgery Hypertension Bronchiectasis Gastroesophageal reflux disease History of colon cancer in his 40s Complete heart block-pacemaker placed Admission 01/09/2023 with a fever and failure to thrive, CT chest with evidence of left lung pneumonia, CT abdomen/pelvis with a distended gallbladder and adjacent fat stranding Anemia       Disposition: Marcus [name] appears stable.  The CA 19-9 was stable when he was here on 05/12/2023.  He will complete another cycle of gemcitabine/Abraxane today.  He will return for an office visit and chemotherapy in 2 weeks.  He will be scheduled for a restaging CT evaluation in January.  Thornton Papas, MD  05/27/2023  1:10 PM

## 2023-05-27 NOTE — Patient Instructions (Signed)
[facility] - A DEPT OF [facility]   Discharge Instructions: Thank you for choosing [facility] to provide your oncology and hematology care.   If you have a lab appointment with the Cancer Center, please go directly to the Cancer Center and check in at the registration area.   Wear comfortable clothing and clothing appropriate for easy access to any Portacath or PICC line.   We strive to give you quality time with your provider. You may need to reschedule your appointment if you arrive late (15 or more minutes).  Arriving late affects you and other patients whose appointments are after yours.  Also, if you miss three or more appointments without notifying the office, you may be dismissed from the clinic at the provider's discretion.      For prescription refill requests, have your pharmacy contact our office and allow 72 hours for refills to be completed.    Today you received the following chemotherapy and/or immunotherapy agents Paclitaxel-protein bound (ABRAXANE) & Gemcitabine  (GEMZAR).      To help prevent nausea and vomiting after your treatment, we encourage you to take your nausea medication as directed.  BELOW ARE SYMPTOMS THAT SHOULD BE REPORTED IMMEDIATELY: *FEVER GREATER THAN 100.4 F (38 C) OR HIGHER *CHILLS OR SWEATING *NAUSEA AND VOMITING THAT IS NOT CONTROLLED WITH YOUR NAUSEA MEDICATION *UNUSUAL SHORTNESS OF BREATH *UNUSUAL BRUISING OR BLEEDING *URINARY PROBLEMS (pain or burning when urinating, or frequent urination) *BOWEL PROBLEMS (unusual diarrhea, constipation, pain near the anus) TENDERNESS IN MOUTH AND THROAT WITH OR WITHOUT PRESENCE OF ULCERS (sore throat, sores in mouth, or a toothache) UNUSUAL RASH, SWELLING OR PAIN  UNUSUAL VAGINAL DISCHARGE OR ITCHING   Items with * indicate a potential emergency and should be followed up as soon as possible or go to the Emergency Department if any problems should  occur.  Please show the CHEMOTHERAPY ALERT CARD or IMMUNOTHERAPY ALERT CARD at check-in to the Emergency Department and triage nurse.  Should you have questions after your visit or need to cancel or reschedule your appointment, please contact [facility] - A DEPT OF [facility]  Dept: [phone]  and follow the prompts.  Office hours are 8:00 a.m. to 4:30 p.m. Monday - Friday. Please note that voicemails left after 4:00 p.m. may not be returned until the following business day.  We are closed weekends and major holidays. You have access to a nurse at all times for urgent questions. Please call the main number to the clinic Dept: [phone] and follow the prompts.   For any non-urgent questions, you may also contact your provider using MyChart. We now offer e-Visits for anyone 34 and older to request care online for non-urgent symptoms. For details visit [email or web address].   Also download the MyChart app! Go to the app store, search "MyChart", open the app, select , and log in with your MyChart username and password.  Paclitaxel Nanoparticle Albumin-Bound Injection What is this medication? NANOPARTICLE ALBUMIN-BOUND PACLITAXEL (Na no PAHR ti kuhl al BYOO muhn-bound PAK li TAX el) treats some types of cancer. It works by slowing down the growth of cancer cells. This medicine may be used for other purposes; ask your health care provider or pharmacist if you have questions. COMMON BRAND NAME(S): Abraxane What should I tell my care team before I take this medication? They need to know if you have any of these conditions: Liver disease Low white blood  cell levels An unusual or allergic reaction to paclitaxel, albumin, other medications, foods, dyes, or preservatives If you or your partner are pregnant or trying to get pregnant Breast-feeding How should I use this medication? This medication is injected into a vein. It is given by your care team  in a hospital or clinic setting. Talk to your care team about the use of this medication in children. Special care may be needed. Overdosage: If you think you have taken too much of this medicine contact a poison control center or emergency room at once. NOTE: This medicine is only for you. Do not share this medicine with others. What if I miss a dose? Keep appointments for follow-up doses. It is important not to miss your dose. Call your care team if you are unable to keep an appointment. What may interact with this medication? Other medications may affect the way this medication works. Talk with your care team about all of the medications you take. They may suggest changes to your treatment plan to lower the risk of side effects and to make sure your medications work as intended. This list may not describe all possible interactions. Give your health care provider a list of all the medicines, herbs, non-prescription drugs, or dietary supplements you use. Also tell them if you smoke, drink alcohol, or use illegal drugs. Some items may interact with your medicine. What should I watch for while using this medication? Your condition will be monitored carefully while you are receiving this medication. You may need blood work while taking this medication. This medication may make you feel generally unwell. This is not uncommon as chemotherapy can affect healthy cells as well as cancer cells. Report any side effects. Continue your course of treatment even though you feel ill unless your care team tells you to stop. This medication can cause serious allergic reactions. To reduce the risk, your care team may give you other medications to take before receiving this one. Be sure to follow the directions from your care team. This medication may increase your risk of getting an infection. Call your care team for advice if you get a fever, chills, sore throat, or other symptoms of a cold or flu. Do not treat  yourself. Try to avoid being around people who are sick. This medication may increase your risk to bruise or bleed. Call your care team if you notice any unusual bleeding. Be careful brushing or flossing your teeth or using a toothpick because you may get an infection or bleed more easily. If you have any dental work done, tell your dentist you are receiving this medication. Talk to your care team if you or your partner may be pregnant. Serious birth defects can occur if you take this medication during pregnancy and for 6 months after the last dose. You will need a negative pregnancy test before starting this medication. Contraception is recommended while taking this medication and for 6 months after the last dose. Your care team can help you find the option that works for you. If your partner can get pregnant, use a condom during sex while taking this medication and for 3 months after the last dose. Do not breastfeed while taking this medication and for 2 weeks after the last dose. This medication may cause infertility. Talk to your care team if you are concerned about your fertility. What side effects may I notice from receiving this medication? Side effects that you should report to your care team as soon as  possible: Allergic reactions--skin rash, itching, hives, swelling of the face, lips, tongue, or throat Dry cough, shortness of breath or trouble breathing Infection--fever, chills, cough, sore throat, wounds that don't heal, pain or trouble when passing urine, general feeling of discomfort or being unwell Low red blood cell level--unusual weakness or fatigue, dizziness, headache, trouble breathing Pain, tingling, or numbness in the hands or feet Stomach pain, unusual weakness or fatigue, nausea, vomiting, diarrhea, or fever that lasts longer than expected Unusual bruising or bleeding Side effects that usually do not require medical attention (report to your care team if they continue or are  bothersome): Diarrhea Fatigue Hair loss Loss of appetite Nausea Vomiting This list may not describe all possible side effects. Call your doctor for medical advice about side effects. You may report side effects to FDA at 1-800-FDA-1088. Where should I keep my medication? This medication is given in a hospital or clinic. It will not be stored at home. NOTE: This sheet is a summary. It may not cover all possible information. If you have questions about this medicine, talk to your doctor, pharmacist, or health care provider.  2024 Elsevier/Gold Standard (2021-11-05 00:00:00)   Gemcitabine Injection What is this medication? GEMCITABINE (jem SYE ta been) treats some types of cancer. It works by slowing down the growth of cancer cells. This medicine may be used for other purposes; ask your health care provider or pharmacist if you have questions. COMMON BRAND NAME(S): Gemzar, Infugem What should I tell my care team before I take this medication? They need to know if you have any of these conditions: Blood disorders Infection Kidney disease Liver disease Lung or breathing disease, such as asthma or COPD Recent or ongoing radiation therapy An unusual or allergic reaction to gemcitabine, other medications, foods, dyes, or preservatives If you or your partner are pregnant or trying to get pregnant Breast-feeding How should I use this medication? This medication is injected into a vein. It is given by your care team in a hospital or clinic setting. Talk to your care team about the use of this medication in children. Special care may be needed. Overdosage: If you think you have taken too much of this medicine contact a poison control center or emergency room at once. NOTE: This medicine is only for you. Do not share this medicine with others. What if I miss a dose? Keep appointments for follow-up doses. It is important not to miss your dose. Call your care team if you are unable to keep an  appointment. What may interact with this medication? Interactions have not been studied. This list may not describe all possible interactions. Give your health care provider a list of all the medicines, herbs, non-prescription drugs, or dietary supplements you use. Also tell them if you smoke, drink alcohol, or use illegal drugs. Some items may interact with your medicine. What should I watch for while using this medication? Your condition will be monitored carefully while you are receiving this medication. This medication may make you feel generally unwell. This is not uncommon, as chemotherapy can affect healthy cells as well as cancer cells. Report any side effects. Continue your course of treatment even though you feel ill unless your care team tells you to stop. In some cases, you may be given additional medications to help with side effects. Follow all directions for their use. This medication may increase your risk of getting an infection. Call your care team for advice if you get a fever, chills, sore throat,  or other symptoms of a cold or flu. Do not treat yourself. Try to avoid being around people who are sick. This medication may increase your risk to bruise or bleed. Call your care team if you notice any unusual bleeding. Be careful brushing or flossing your teeth or using a toothpick because you may get an infection or bleed more easily. If you have any dental work done, tell your dentist you are receiving this medication. Avoid taking medications that contain aspirin, acetaminophen, ibuprofen, naproxen, or ketoprofen unless instructed by your care team. These medications may hide a fever. Talk to your care team if you or your partner wish to become pregnant or think you might be pregnant. This medication can cause serious birth defects if taken during pregnancy and for 6 months after the last dose. A negative pregnancy test is required before starting this medication. A reliable form of  contraception is recommended while taking this medication and for 6 months after the last dose. Talk to your care team about effective forms of contraception. Do not father a child while taking this medication and for 3 months after the last dose. Use a condom while having sex during this time period. Do not breastfeed while taking this medication and for at least 1 week after the last dose. This medication may cause infertility. Talk to your care team if you are concerned about your fertility. What side effects may I notice from receiving this medication? Side effects that you should report to your care team as soon as possible: Allergic reactions--skin rash, itching, hives, swelling of the face, lips, tongue, or throat Capillary leak syndrome--stomach or muscle pain, unusual weakness or fatigue, feeling faint or lightheaded, decrease in the amount of urine, swelling of the ankles, hands, or feet, trouble breathing Infection--fever, chills, cough, sore throat, wounds that don't heal, pain or trouble when passing urine, general feeling of discomfort or being unwell Liver injury--right upper belly pain, loss of appetite, nausea, light-colored stool, dark yellow or brown urine, yellowing skin or eyes, unusual weakness or fatigue Low red blood cell level--unusual weakness or fatigue, dizziness, headache, trouble breathing Lung injury--shortness of breath or trouble breathing, cough, spitting up blood, chest pain, fever Stomach pain, bloody diarrhea, pale skin, unusual weakness or fatigue, decrease in the amount of urine, which may be signs of hemolytic uremic syndrome Sudden and severe headache, confusion, change in vision, seizures, which may be signs of posterior reversible encephalopathy syndrome (PRES) Unusual bruising or bleeding Side effects that usually do not require medical attention (report to your care team if they continue or are bothersome): Diarrhea Drowsiness Hair loss Nausea Pain,  redness, or swelling with sores inside the mouth or throat Vomiting This list may not describe all possible side effects. Call your doctor for medical advice about side effects. You may report side effects to FDA at 1-800-FDA-1088. Where should I keep my medication? This medication is given in a hospital or clinic. It will not be stored at home. NOTE: This sheet is a summary. It may not cover all possible information. If you have questions about this medicine, talk to your doctor, pharmacist, or health care provider.  2024 Elsevier/Gold Standard (2021-10-27 00:00:00)

## 2023-05-27 NOTE — Progress Notes (Signed)
Patient seen by Dr. Thornton Papas today  Vitals are within treatment parameters:Yes   Labs are within treatment parameters: Yes   Treatment plan has been signed: Yes   Per physician team, Patient is ready for treatment and there are NO modifications to the treatment plan.

## 2023-05-29 LAB — CANCER ANTIGEN 19-9: CA 19-9: 734 U/mL — ABNORMAL HIGH (ref 0–35)

## 2023-06-02 DIAGNOSIS — R262 Difficulty in walking, not elsewhere classified: Secondary | ICD-10-CM | POA: Diagnosis not present

## 2023-06-02 DIAGNOSIS — R2689 Other abnormalities of gait and mobility: Secondary | ICD-10-CM | POA: Diagnosis not present

## 2023-06-02 DIAGNOSIS — Z9181 History of falling: Secondary | ICD-10-CM | POA: Diagnosis not present

## 2023-06-09 ENCOUNTER — Telehealth: Payer: Self-pay | Admitting: *Deleted

## 2023-06-09 ENCOUNTER — Other Ambulatory Visit: Payer: Self-pay | Admitting: Adult Health

## 2023-06-09 DIAGNOSIS — F5101 Primary insomnia: Secondary | ICD-10-CM

## 2023-06-09 NOTE — Telephone Encounter (Signed)
Daughter, Vernona Rieger wanted MD aware that there has been a significant change in her father's mood and temperament. He is often angry, mean, rude and often horrible to get along with. Was rude to [place] of country [place] that he had wanted to visit.  Does not want patient to know she called. He sees Dr. Truett Perna on 12/6

## 2023-06-09 NOTE — Progress Notes (Signed)
Remote pacemaker transmission.   

## 2023-06-10 ENCOUNTER — Other Ambulatory Visit (HOSPITAL_BASED_OUTPATIENT_CLINIC_OR_DEPARTMENT_OTHER): Payer: Self-pay

## 2023-06-10 ENCOUNTER — Inpatient Hospital Stay: Payer: No Typology Code available for payment source

## 2023-06-10 ENCOUNTER — Inpatient Hospital Stay (HOSPITAL_BASED_OUTPATIENT_CLINIC_OR_DEPARTMENT_OTHER): Payer: No Typology Code available for payment source | Admitting: Oncology

## 2023-06-10 ENCOUNTER — Other Ambulatory Visit: Payer: Self-pay

## 2023-06-10 ENCOUNTER — Encounter: Payer: Self-pay | Admitting: Oncology

## 2023-06-10 ENCOUNTER — Inpatient Hospital Stay: Payer: No Typology Code available for payment source | Attending: Oncology

## 2023-06-10 VITALS — BP 162/65 | HR 60

## 2023-06-10 VITALS — BP 139/62 | HR 66 | Temp 98.1°F | Resp 18 | Ht 68.0 in | Wt 214.0 lb

## 2023-06-10 DIAGNOSIS — G4730 Sleep apnea, unspecified: Secondary | ICD-10-CM | POA: Diagnosis not present

## 2023-06-10 DIAGNOSIS — Z5111 Encounter for antineoplastic chemotherapy: Secondary | ICD-10-CM | POA: Diagnosis present

## 2023-06-10 DIAGNOSIS — K219 Gastro-esophageal reflux disease without esophagitis: Secondary | ICD-10-CM | POA: Diagnosis not present

## 2023-06-10 DIAGNOSIS — I2510 Atherosclerotic heart disease of native coronary artery without angina pectoris: Secondary | ICD-10-CM | POA: Diagnosis not present

## 2023-06-10 DIAGNOSIS — D649 Anemia, unspecified: Secondary | ICD-10-CM | POA: Diagnosis not present

## 2023-06-10 DIAGNOSIS — C250 Malignant neoplasm of head of pancreas: Secondary | ICD-10-CM | POA: Diagnosis not present

## 2023-06-10 DIAGNOSIS — I10 Essential (primary) hypertension: Secondary | ICD-10-CM | POA: Diagnosis not present

## 2023-06-10 DIAGNOSIS — C259 Malignant neoplasm of pancreas, unspecified: Secondary | ICD-10-CM

## 2023-06-10 DIAGNOSIS — M109 Gout, unspecified: Secondary | ICD-10-CM | POA: Insufficient documentation

## 2023-06-10 DIAGNOSIS — I4891 Unspecified atrial fibrillation: Secondary | ICD-10-CM | POA: Insufficient documentation

## 2023-06-10 DIAGNOSIS — J479 Bronchiectasis, uncomplicated: Secondary | ICD-10-CM | POA: Insufficient documentation

## 2023-06-10 DIAGNOSIS — K831 Obstruction of bile duct: Secondary | ICD-10-CM | POA: Insufficient documentation

## 2023-06-10 DIAGNOSIS — Z85038 Personal history of other malignant neoplasm of large intestine: Secondary | ICD-10-CM | POA: Diagnosis not present

## 2023-06-10 DIAGNOSIS — C787 Secondary malignant neoplasm of liver and intrahepatic bile duct: Secondary | ICD-10-CM | POA: Insufficient documentation

## 2023-06-10 LAB — CBC WITH DIFFERENTIAL (CANCER CENTER ONLY)
Abs Immature Granulocytes: 0.04 10*3/uL (ref 0.00–0.07)
Basophils Absolute: 0 10*3/uL (ref 0.0–0.1)
Basophils Relative: 0 %
Eosinophils Absolute: 0.2 10*3/uL (ref 0.0–0.5)
Eosinophils Relative: 3 %
HCT: 28.2 % — ABNORMAL LOW (ref 39.0–52.0)
Hemoglobin: 8.8 g/dL — ABNORMAL LOW (ref 13.0–17.0)
Immature Granulocytes: 1 %
Lymphocytes Relative: 16 %
Lymphs Abs: 0.8 10*3/uL (ref 0.7–4.0)
MCH: 28.2 pg (ref 26.0–34.0)
MCHC: 31.2 g/dL (ref 30.0–36.0)
MCV: 90.4 fL (ref 80.0–100.0)
Monocytes Absolute: 0.7 10*3/uL (ref 0.1–1.0)
Monocytes Relative: 13 %
Neutro Abs: 3.4 10*3/uL (ref 1.7–7.7)
Neutrophils Relative %: 67 %
Platelet Count: 209 10*3/uL (ref 150–400)
RBC: 3.12 MIL/uL — ABNORMAL LOW (ref 4.22–5.81)
RDW: 19 % — ABNORMAL HIGH (ref 11.5–15.5)
WBC Count: 5.1 10*3/uL (ref 4.0–10.5)
nRBC: 0 % (ref 0.0–0.2)

## 2023-06-10 LAB — CMP (CANCER CENTER ONLY)
ALT: 28 U/L (ref 0–44)
AST: 25 U/L (ref 15–41)
Albumin: 3.3 g/dL — ABNORMAL LOW (ref 3.5–5.0)
Alkaline Phosphatase: 100 U/L (ref 38–126)
Anion gap: 4 — ABNORMAL LOW (ref 5–15)
BUN: 23 mg/dL (ref 8–23)
CO2: 27 mmol/L (ref 22–32)
Calcium: 8.7 mg/dL — ABNORMAL LOW (ref 8.9–10.3)
Chloride: 105 mmol/L (ref 98–111)
Creatinine: 1.1 mg/dL (ref 0.61–1.24)
GFR, Estimated: >60 mL/min (ref >60)
Glucose, Bld: 137 mg/dL — ABNORMAL HIGH (ref 70–99)
Potassium: 3.7 mmol/L (ref 3.5–5.1)
Sodium: 136 mmol/L (ref 135–145)
Total Bilirubin: 0.4 mg/dL (ref <1.2)
Total Protein: 6.4 g/dL — ABNORMAL LOW (ref 6.5–8.1)

## 2023-06-10 MED ORDER — SODIUM CHLORIDE 0.9% FLUSH
10.0000 mL | INTRAVENOUS | Status: DC | PRN
  Administered 2023-06-10: 10 mL

## 2023-06-10 MED ORDER — HEPARIN SOD (PORK) LOCK FLUSH 100 UNIT/ML IV SOLN
500.0000 [IU] | Freq: Once | INTRAVENOUS | Status: AC | PRN
  Administered 2023-06-10: 500 [IU]

## 2023-06-10 MED ORDER — PROCHLORPERAZINE MALEATE 10 MG PO TABS
10.0000 mg | ORAL_TABLET | Freq: Once | ORAL | Status: AC
  Administered 2023-06-10: 10 mg via ORAL
  Filled 2023-06-10: qty 1

## 2023-06-10 MED ORDER — SODIUM CHLORIDE 0.9 % IV SOLN
800.0000 mg/m2 | Freq: Once | INTRAVENOUS | Status: AC
  Administered 2023-06-10: 1710 mg via INTRAVENOUS
  Filled 2023-06-10: qty 26.28

## 2023-06-10 MED ORDER — PACLITAXEL PROTEIN-BOUND CHEMO INJECTION 100 MG
100.0000 mg/m2 | Freq: Once | INTRAVENOUS | Status: AC
  Administered 2023-06-10: 200 mg via INTRAVENOUS
  Filled 2023-06-10: qty 40

## 2023-06-10 MED ORDER — SODIUM CHLORIDE 0.9 % IV SOLN: Freq: Once | INTRAVENOUS | Status: AC

## 2023-06-10 MED ORDER — MIRTAZAPINE 15 MG PO TABS
15.0000 mg | ORAL_TABLET | Freq: Every day | ORAL | 2 refills | Status: DC
  Filled 2023-06-10: qty 30, 30d supply, fill #0

## 2023-06-10 NOTE — Progress Notes (Signed)
[facility] OFFICE PROGRESS NOTE   Diagnosis: Pancreas cancer  INTERVAL HISTORY:   Marcus [name] complete another cycle of gemcitabine/Abraxane on 05/27/2023.  No fever or rash.  He reports stable numbness in the feet.  He had 3 days of abdominal distention earlier this week.  This has improved.  He reports anorexia.  He is not getting out of the house much.  Objective:  Vital signs in last 24 hours:  Blood pressure 139/62, pulse 66, temperature 98.1 F (36.7 C), temperature source Temporal, resp. rate 18, height 5\' 8"  (1.727 m), weight 214 lb (97.1 kg), SpO2 100%.    HEENT: No thrush Resp: Lungs clear bilaterally Cardio: Regular rate and rhythm GI: Nontender, no mass, no apparent ascites, no hepatosplenomegaly Vascular: Slow leg edema bilaterally   Portacath/PICC-without erythema  Lab Results:  Lab Results  Component Value Date   WBC 5.1 06/10/2023   HGB 8.8 (L) 06/10/2023   HCT 28.2 (L) 06/10/2023   MCV 90.4 06/10/2023   PLT 209 06/10/2023   NEUTROABS 3.4 06/10/2023    CMP  Lab Results  Component Value Date   NA 139 05/27/2023   K 3.7 05/27/2023   CL 107 05/27/2023   CO2 27 05/27/2023   GLUCOSE 134 (H) 05/27/2023   BUN 31 (H) 05/27/2023   CREATININE 0.94 05/27/2023   CALCIUM 9.2 05/27/2023   PROT 6.2 (L) 05/27/2023   ALBUMIN 3.5 05/27/2023   AST 16 05/27/2023   ALT 12 05/27/2023   ALKPHOS 57 05/27/2023   BILITOT 0.3 05/27/2023   GFRNONAA >60 05/27/2023   GFRAA >60 02/07/2019    Lab Results  Component Value Date   CEA1 6.4 (H) 12/11/2022   CEA 1.1 03/03/2010   CAN199 734 (H) 05/27/2023    Lab Results  Component Value Date   INR 1.3 (H) 01/09/2023   LABPROT 16.0 (H) 01/09/2023    Imaging:  No results found.  Medications: I have reviewed the patient's current medications.   Assessment/Plan: Pancreas cancer CT abdomen/pelvis 12/03/2022-suspected pancreas mass in the proximal body measuring 2 cm with probable mass effect on  the superior aspect of the SMV, multiple hypodense liver lesions, many are new from a remote CT-indeterminate, 2 cystic lesions in the pancreas tail-nonspecific prior bariatric surgery ERCP 12/13/2022-malignant appearing common bile duct stricture, plastic stent placed, brushing cytology-no malignant cells EUS 12/23/2022-30 x 33 mm pancreas head mass with invasion of the SMV, no malignant appearing lymph nodes, 4 cysts in the visualized liver, FNA-adenocarcinoma Cycle 1 gemcitabine and abraxane 01/07/2023 CTs 01/09/2023-multiple low-attenuation liver lesions unchanged, largest of which are clearly benign fluid attenuation cyst, others more ill-defined and incompletely characterized suspicious for small metastases.  Similar appearance of hypodense mass in the pancreatic head.  Distended gallbladder with mild gallbladder wall thickening and adjacent pericholecystic fat stranding.  Sludge in the gallbladder fundus.  Common bile duct stent in place with diminished, persistent intrahepatic biliary ductal dilatation.  Negative for pulmonary embolism.  Acute appearing heterogeneous airspace opacity of the superior segment left lower lobe consistent with infection or aspiration. Cycle 2 gemcitabine and abraxane 01/21/2023, gemcitabine dose reduced due to neutropenia following cycle 1 Cycle 3 gemcitabine/Abraxane 02/04/2023 Cycle 4 gemcitabine/Abraxane 02/17/2023 Cycle 5 gemcitabine/Abraxane 03/04/2023 Cycle 6 gemcitabine/Abraxane 03/17/2023 CT abdomen/pelvis 03/28/2023-stable pancreas mass, multiple small hypoattenuating liver lesions (review of CTs at the GI tumor conference is most consistent with liver metastases-some smaller compared to the baseline CT) Cycle 7 gemcitabine/Abraxane 04/01/2023 Cycle 8 gemcitabine/Abraxane 04/15/2023 Cycle 9 gemcitabine/Abraxane 04/29/2023 Treatment held  per patient request 05/12/2023 Cycle 10 gemcitabine/Abraxane 05/27/2023 Cycle 11 gemcitabine/Abraxane 06/10/2023 Biliary obstruction  secondary to 1-status post ERCP/stent placement 12/13/2022 Coronary artery disease Atrial fibrillation Gout Sleep apnea Status post bariatric surgery Hypertension Bronchiectasis Gastroesophageal reflux disease History of colon cancer in his 40s Complete heart block-pacemaker placed Admission 01/09/2023 with a fever and failure to thrive, CT chest with evidence of left lung pneumonia, CT abdomen/pelvis with a distended gallbladder and adjacent fat stranding Anemia        Disposition: Marcus [name] has metastatic pancreas cancer.  He continues treatment with gemcitabine/Abraxane.  He appears to be tolerating the treatment well.  His overall clinical status is unchanged, but he has anorexia and depression symptoms.  He agrees to a trial of mirtazapine.  We will follow-up on the CA 19-9 from today.  He will complete another treatment with gemcitabine/Abraxane today.  Marcus [name] will return for an office visit and chemotherapy in 2 weeks.  He will be scheduled for a restaging CT after the next cycle of chemotherapy.  Thornton Papas, MD  06/10/2023  8:26 AM

## 2023-06-10 NOTE — Progress Notes (Signed)
Patient seen by Dr. Thornton Papas today  Vitals are within treatment parameters:Yes   Labs are within treatment parameters: Yes   Treatment plan has been signed: Yes   Per physician team, Patient is ready for treatment and there are NO modifications to the treatment plan.

## 2023-06-10 NOTE — Patient Instructions (Signed)
[facility] A DEPT OF [facility]   Discharge Instructions: Thank you for choosing [facility] to provide your oncology and hematology care.   If you have a lab appointment with the Cancer Center, please go directly to the Cancer Center and check in at the registration area.   Wear comfortable clothing and clothing appropriate for easy access to any Portacath or PICC line.   We strive to give you quality time with your provider. You may need to reschedule your appointment if you arrive late (15 or more minutes).  Arriving late affects you and other patients whose appointments are after yours.  Also, if you miss three or more appointments without notifying the office, you may be dismissed from the clinic at the provider's discretion.      For prescription refill requests, have your pharmacy contact our office and allow 72 hours for refills to be completed.    Today you received the following chemotherapy and/or immunotherapy agents Paclitaxel-protein bound (ABRAXANE) & Gemcitabine (GEMZAR).      To help prevent nausea and vomiting after your treatment, we encourage you to take your nausea medication as directed.  BELOW ARE SYMPTOMS THAT SHOULD BE REPORTED IMMEDIATELY: *FEVER GREATER THAN 100.4 F (38 C) OR HIGHER *CHILLS OR SWEATING *NAUSEA AND VOMITING THAT IS NOT CONTROLLED WITH YOUR NAUSEA MEDICATION *UNUSUAL SHORTNESS OF BREATH *UNUSUAL BRUISING OR BLEEDING *URINARY PROBLEMS (pain or burning when urinating, or frequent urination) *BOWEL PROBLEMS (unusual diarrhea, constipation, pain near the anus) TENDERNESS IN MOUTH AND THROAT WITH OR WITHOUT PRESENCE OF ULCERS (sore throat, sores in mouth, or a toothache) UNUSUAL RASH, SWELLING OR PAIN  UNUSUAL VAGINAL DISCHARGE OR ITCHING   Items with * indicate a potential emergency and should be followed up as soon as possible or go to the Emergency Department if any problems should occur.  Please  show the CHEMOTHERAPY ALERT CARD or IMMUNOTHERAPY ALERT CARD at check-in to the Emergency Department and triage nurse.  Should you have questions after your visit or need to cancel or reschedule your appointment, please contact [facility]  Dept: [phone]  and follow the prompts.  Office hours are 8:00 a.m. to 4:30 p.m. Monday - Friday. Please note that voicemails left after 4:00 p.m. may not be returned until the following business day.  We are closed weekends and major holidays. You have access to a nurse at all times for urgent questions. Please call the main number to the clinic Dept: [phone] and follow the prompts.   For any non-urgent questions, you may also contact your provider using MyChart. We now offer e-Visits for anyone 52 and older to request care online for non-urgent symptoms. For details visit [email or web address].   Also download the MyChart app! Go to the app store, search "MyChart", open the app, select [place], and log in with your MyChart username and password.   Paclitaxel Nanoparticle Albumin-Bound Injection What is this medication? NANOPARTICLE ALBUMIN-BOUND PACLITAXEL (Na no PAHR ti kuhl al BYOO muhn-bound PAK li TAX el) treats some types of cancer. It works by slowing down the growth of cancer cells. This medicine may be used for other purposes; ask your health care provider or pharmacist if you have questions. COMMON BRAND NAME(S): Abraxane What should I tell my care team before I take this medication? They need to know if you have any of these conditions: Liver disease Low white blood  cell levels An unusual or allergic reaction to paclitaxel, albumin, other medications, foods, dyes, or preservatives If you or your partner are pregnant or trying to get pregnant Breast-feeding How should I use this medication? This medication is injected into a vein. It is given by your care team in a hospital or  clinic setting. Talk to your care team about the use of this medication in children. Special care may be needed. Overdosage: If you think you have taken too much of this medicine contact a poison control center or emergency room at once. NOTE: This medicine is only for you. Do not share this medicine with others. What if I miss a dose? Keep appointments for follow-up doses. It is important not to miss your dose. Call your care team if you are unable to keep an appointment. What may interact with this medication? Other medications may affect the way this medication works. Talk with your care team about all of the medications you take. They may suggest changes to your treatment plan to lower the risk of side effects and to make sure your medications work as intended. This list may not describe all possible interactions. Give your health care provider a list of all the medicines, herbs, non-prescription drugs, or dietary supplements you use. Also tell them if you smoke, drink alcohol, or use illegal drugs. Some items may interact with your medicine. What should I watch for while using this medication? Your condition will be monitored carefully while you are receiving this medication. You may need blood work while taking this medication. This medication may make you feel generally unwell. This is not uncommon as chemotherapy can affect healthy cells as well as cancer cells. Report any side effects. Continue your course of treatment even though you feel ill unless your care team tells you to stop. This medication can cause serious allergic reactions. To reduce the risk, your care team may give you other medications to take before receiving this one. Be sure to follow the directions from your care team. This medication may increase your risk of getting an infection. Call your care team for advice if you get a fever, chills, sore throat, or other symptoms of a cold or flu. Do not treat yourself. Try to avoid  being around people who are sick. This medication may increase your risk to bruise or bleed. Call your care team if you notice any unusual bleeding. Be careful brushing or flossing your teeth or using a toothpick because you may get an infection or bleed more easily. If you have any dental work done, tell your dentist you are receiving this medication. Talk to your care team if you or your partner may be pregnant. Serious birth defects can occur if you take this medication during pregnancy and for 6 months after the last dose. You will need a negative pregnancy test before starting this medication. Contraception is recommended while taking this medication and for 6 months after the last dose. Your care team can help you find the option that works for you. If your partner can get pregnant, use a condom during sex while taking this medication and for 3 months after the last dose. Do not breastfeed while taking this medication and for 2 weeks after the last dose. This medication may cause infertility. Talk to your care team if you are concerned about your fertility. What side effects may I notice from receiving this medication? Side effects that you should report to your care team as soon as  possible: Allergic reactions--skin rash, itching, hives, swelling of the face, lips, tongue, or throat Dry cough, shortness of breath or trouble breathing Infection--fever, chills, cough, sore throat, wounds that don't heal, pain or trouble when passing urine, general feeling of discomfort or being unwell Low red blood cell level--unusual weakness or fatigue, dizziness, headache, trouble breathing Pain, tingling, or numbness in the hands or feet Stomach pain, unusual weakness or fatigue, nausea, vomiting, diarrhea, or fever that lasts longer than expected Unusual bruising or bleeding Side effects that usually do not require medical attention (report to your care team if they continue or are  bothersome): Diarrhea Fatigue Hair loss Loss of appetite Nausea Vomiting This list may not describe all possible side effects. Call your doctor for medical advice about side effects. You may report side effects to FDA at 1-800-FDA-1088. Where should I keep my medication? This medication is given in a hospital or clinic. It will not be stored at home. NOTE: This sheet is a summary. It may not cover all possible information. If you have questions about this medicine, talk to your doctor, pharmacist, or health care provider.  2024 Elsevier/Gold Standard (2021-11-05 00:00:00)  Gemcitabine Injection What is this medication? GEMCITABINE (jem SYE ta been) treats some types of cancer. It works by slowing down the growth of cancer cells. This medicine may be used for other purposes; ask your health care provider or pharmacist if you have questions. COMMON BRAND NAME(S): Gemzar, Infugem What should I tell my care team before I take this medication? They need to know if you have any of these conditions: Blood disorders Infection Kidney disease Liver disease Lung or breathing disease, such as asthma or COPD Recent or ongoing radiation therapy An unusual or allergic reaction to gemcitabine, other medications, foods, dyes, or preservatives If you or your partner are pregnant or trying to get pregnant Breast-feeding How should I use this medication? This medication is injected into a vein. It is given by your care team in a hospital or clinic setting. Talk to your care team about the use of this medication in children. Special care may be needed. Overdosage: If you think you have taken too much of this medicine contact a poison control center or emergency room at once. NOTE: This medicine is only for you. Do not share this medicine with others. What if I miss a dose? Keep appointments for follow-up doses. It is important not to miss your dose. Call your care team if you are unable to keep an  appointment. What may interact with this medication? Interactions have not been studied. This list may not describe all possible interactions. Give your health care provider a list of all the medicines, herbs, non-prescription drugs, or dietary supplements you use. Also tell them if you smoke, drink alcohol, or use illegal drugs. Some items may interact with your medicine. What should I watch for while using this medication? Your condition will be monitored carefully while you are receiving this medication. This medication may make you feel generally unwell. This is not uncommon, as chemotherapy can affect healthy cells as well as cancer cells. Report any side effects. Continue your course of treatment even though you feel ill unless your care team tells you to stop. In some cases, you may be given additional medications to help with side effects. Follow all directions for their use. This medication may increase your risk of getting an infection. Call your care team for advice if you get a fever, chills, sore throat, or  other symptoms of a cold or flu. Do not treat yourself. Try to avoid being around people who are sick. This medication may increase your risk to bruise or bleed. Call your care team if you notice any unusual bleeding. Be careful brushing or flossing your teeth or using a toothpick because you may get an infection or bleed more easily. If you have any dental work done, tell your dentist you are receiving this medication. Avoid taking medications that contain aspirin, acetaminophen, ibuprofen, naproxen, or ketoprofen unless instructed by your care team. These medications may hide a fever. Talk to your care team if you or your partner wish to become pregnant or think you might be pregnant. This medication can cause serious birth defects if taken during pregnancy and for 6 months after the last dose. A negative pregnancy test is required before starting this medication. A reliable form of  contraception is recommended while taking this medication and for 6 months after the last dose. Talk to your care team about effective forms of contraception. Do not father a child while taking this medication and for 3 months after the last dose. Use a condom while having sex during this time period. Do not breastfeed while taking this medication and for at least 1 week after the last dose. This medication may cause infertility. Talk to your care team if you are concerned about your fertility. What side effects may I notice from receiving this medication? Side effects that you should report to your care team as soon as possible: Allergic reactions--skin rash, itching, hives, swelling of the face, lips, tongue, or throat Capillary leak syndrome--stomach or muscle pain, unusual weakness or fatigue, feeling faint or lightheaded, decrease in the amount of urine, swelling of the ankles, hands, or feet, trouble breathing Infection--fever, chills, cough, sore throat, wounds that don't heal, pain or trouble when passing urine, general feeling of discomfort or being unwell Liver injury--right upper belly pain, loss of appetite, nausea, light-colored stool, dark yellow or brown urine, yellowing skin or eyes, unusual weakness or fatigue Low red blood cell level--unusual weakness or fatigue, dizziness, headache, trouble breathing Lung injury--shortness of breath or trouble breathing, cough, spitting up blood, chest pain, fever Stomach pain, bloody diarrhea, pale skin, unusual weakness or fatigue, decrease in the amount of urine, which may be signs of hemolytic uremic syndrome Sudden and severe headache, confusion, change in vision, seizures, which may be signs of posterior reversible encephalopathy syndrome (PRES) Unusual bruising or bleeding Side effects that usually do not require medical attention (report to your care team if they continue or are bothersome): Diarrhea Drowsiness Hair loss Nausea Pain,  redness, or swelling with sores inside the mouth or throat Vomiting This list may not describe all possible side effects. Call your doctor for medical advice about side effects. You may report side effects to FDA at 1-800-FDA-1088. Where should I keep my medication? This medication is given in a hospital or clinic. It will not be stored at home. NOTE: This sheet is a summary. It may not cover all possible information. If you have questions about this medicine, talk to your doctor, pharmacist, or health care provider.  2024 Elsevier/Gold Standard (2021-10-27 00:00:00)

## 2023-06-10 NOTE — Addendum Note (Signed)
Addended by: Wandalee Ferdinand on: 06/10/2023 08:58 AM   Modules accepted: Orders

## 2023-06-13 ENCOUNTER — Other Ambulatory Visit (HOSPITAL_BASED_OUTPATIENT_CLINIC_OR_DEPARTMENT_OTHER): Payer: Self-pay

## 2023-06-13 ENCOUNTER — Other Ambulatory Visit: Payer: Self-pay | Admitting: Adult Health

## 2023-06-13 DIAGNOSIS — F5101 Primary insomnia: Secondary | ICD-10-CM

## 2023-06-14 ENCOUNTER — Other Ambulatory Visit (HOSPITAL_BASED_OUTPATIENT_CLINIC_OR_DEPARTMENT_OTHER): Payer: Self-pay

## 2023-06-14 MED ORDER — ESZOPICLONE 3 MG PO TABS
3.0000 mg | ORAL_TABLET | Freq: Every day | ORAL | 2 refills | Status: DC
  Filled 2023-06-14: qty 30, 30d supply, fill #0
  Filled 2023-07-13: qty 30, 30d supply, fill #1

## 2023-06-14 NOTE — Telephone Encounter (Signed)
Okay for refill?  

## 2023-06-15 ENCOUNTER — Ambulatory Visit: Payer: Medicare Other | Admitting: Adult Health

## 2023-06-16 ENCOUNTER — Other Ambulatory Visit (HOSPITAL_BASED_OUTPATIENT_CLINIC_OR_DEPARTMENT_OTHER): Payer: Self-pay

## 2023-06-17 ENCOUNTER — Other Ambulatory Visit: Payer: Self-pay | Admitting: *Deleted

## 2023-06-17 DIAGNOSIS — C259 Malignant neoplasm of pancreas, unspecified: Secondary | ICD-10-CM

## 2023-06-24 ENCOUNTER — Encounter: Payer: Self-pay | Admitting: Nurse Practitioner

## 2023-06-24 ENCOUNTER — Inpatient Hospital Stay (HOSPITAL_BASED_OUTPATIENT_CLINIC_OR_DEPARTMENT_OTHER): Payer: No Typology Code available for payment source | Admitting: Nurse Practitioner

## 2023-06-24 ENCOUNTER — Inpatient Hospital Stay: Payer: No Typology Code available for payment source

## 2023-06-24 VITALS — BP 162/78 | HR 60 | Resp 18

## 2023-06-24 VITALS — BP 148/68 | HR 63 | Temp 98.1°F | Resp 18 | Ht 68.0 in | Wt 218.7 lb

## 2023-06-24 DIAGNOSIS — K831 Obstruction of bile duct: Secondary | ICD-10-CM | POA: Diagnosis not present

## 2023-06-24 DIAGNOSIS — G4730 Sleep apnea, unspecified: Secondary | ICD-10-CM

## 2023-06-24 DIAGNOSIS — I2510 Atherosclerotic heart disease of native coronary artery without angina pectoris: Secondary | ICD-10-CM

## 2023-06-24 DIAGNOSIS — I10 Essential (primary) hypertension: Secondary | ICD-10-CM

## 2023-06-24 DIAGNOSIS — C250 Malignant neoplasm of head of pancreas: Secondary | ICD-10-CM | POA: Diagnosis not present

## 2023-06-24 DIAGNOSIS — C259 Malignant neoplasm of pancreas, unspecified: Secondary | ICD-10-CM

## 2023-06-24 DIAGNOSIS — D649 Anemia, unspecified: Secondary | ICD-10-CM

## 2023-06-24 DIAGNOSIS — I4891 Unspecified atrial fibrillation: Secondary | ICD-10-CM | POA: Diagnosis not present

## 2023-06-24 DIAGNOSIS — Z85038 Personal history of other malignant neoplasm of large intestine: Secondary | ICD-10-CM

## 2023-06-24 DIAGNOSIS — Z5111 Encounter for antineoplastic chemotherapy: Secondary | ICD-10-CM | POA: Diagnosis not present

## 2023-06-24 DIAGNOSIS — Z950 Presence of cardiac pacemaker: Secondary | ICD-10-CM

## 2023-06-24 DIAGNOSIS — J479 Bronchiectasis, uncomplicated: Secondary | ICD-10-CM

## 2023-06-24 DIAGNOSIS — K219 Gastro-esophageal reflux disease without esophagitis: Secondary | ICD-10-CM

## 2023-06-24 DIAGNOSIS — Z9884 Bariatric surgery status: Secondary | ICD-10-CM

## 2023-06-24 LAB — CMP (CANCER CENTER ONLY)
ALT: 14 U/L (ref 0–44)
AST: 15 U/L (ref 15–41)
Albumin: 3.4 g/dL — ABNORMAL LOW (ref 3.5–5.0)
Alkaline Phosphatase: 87 U/L (ref 38–126)
Anion gap: 6 (ref 5–15)
BUN: 20 mg/dL (ref 8–23)
CO2: 26 mmol/L (ref 22–32)
Calcium: 8.7 mg/dL — ABNORMAL LOW (ref 8.9–10.3)
Chloride: 107 mmol/L (ref 98–111)
Creatinine: 1.29 mg/dL — ABNORMAL HIGH (ref 0.61–1.24)
GFR, Estimated: 55 mL/min — ABNORMAL LOW (ref >60)
Glucose, Bld: 113 mg/dL — ABNORMAL HIGH (ref 70–99)
Potassium: 3.8 mmol/L (ref 3.5–5.1)
Sodium: 139 mmol/L (ref 135–145)
Total Bilirubin: 0.3 mg/dL (ref <1.2)
Total Protein: 6.4 g/dL — ABNORMAL LOW (ref 6.5–8.1)

## 2023-06-24 LAB — CBC WITH DIFFERENTIAL (CANCER CENTER ONLY)
Abs Immature Granulocytes: 0.04 10*3/uL (ref 0.00–0.07)
Basophils Absolute: 0 10*3/uL (ref 0.0–0.1)
Basophils Relative: 1 %
Eosinophils Absolute: 0.1 10*3/uL (ref 0.0–0.5)
Eosinophils Relative: 3 %
HCT: 28.8 % — ABNORMAL LOW (ref 39.0–52.0)
Hemoglobin: 9 g/dL — ABNORMAL LOW (ref 13.0–17.0)
Immature Granulocytes: 1 %
Lymphocytes Relative: 20 %
Lymphs Abs: 0.8 10*3/uL (ref 0.7–4.0)
MCH: 28.1 pg (ref 26.0–34.0)
MCHC: 31.3 g/dL (ref 30.0–36.0)
MCV: 90 fL (ref 80.0–100.0)
Monocytes Absolute: 0.6 10*3/uL (ref 0.1–1.0)
Monocytes Relative: 14 %
Neutro Abs: 2.5 10*3/uL (ref 1.7–7.7)
Neutrophils Relative %: 61 %
Platelet Count: 198 10*3/uL (ref 150–400)
RBC: 3.2 MIL/uL — ABNORMAL LOW (ref 4.22–5.81)
RDW: 18.4 % — ABNORMAL HIGH (ref 11.5–15.5)
WBC Count: 4 10*3/uL (ref 4.0–10.5)
nRBC: 0 % (ref 0.0–0.2)

## 2023-06-24 MED ORDER — SODIUM CHLORIDE 0.9 % IV SOLN: Freq: Once | INTRAVENOUS | Status: AC

## 2023-06-24 MED ORDER — PROCHLORPERAZINE MALEATE 10 MG PO TABS
10.0000 mg | ORAL_TABLET | Freq: Once | ORAL | Status: AC
Start: 2023-06-24 — End: 2023-06-24
  Administered 2023-06-24: 10 mg via ORAL
  Filled 2023-06-24: qty 1

## 2023-06-24 MED ORDER — PACLITAXEL PROTEIN-BOUND CHEMO INJECTION 100 MG
100.0000 mg/m2 | Freq: Once | INTRAVENOUS | Status: AC
Start: 2023-06-24 — End: 2023-06-24
  Administered 2023-06-24: 200 mg via INTRAVENOUS
  Filled 2023-06-24: qty 40

## 2023-06-24 MED ORDER — SODIUM CHLORIDE 0.9 % IV SOLN
800.0000 mg/m2 | Freq: Once | INTRAVENOUS | Status: AC
  Administered 2023-06-24: 1710 mg via INTRAVENOUS
  Filled 2023-06-24: qty 25.98

## 2023-06-24 MED ORDER — SODIUM CHLORIDE 0.9% FLUSH
10.0000 mL | INTRAVENOUS | Status: DC | PRN
  Administered 2023-06-24: 10 mL

## 2023-06-24 MED ORDER — HEPARIN SOD (PORK) LOCK FLUSH 100 UNIT/ML IV SOLN
500.0000 [IU] | Freq: Once | INTRAVENOUS | Status: AC | PRN
Start: 2023-06-24 — End: 2023-06-24
  Administered 2023-06-24: 500 [IU]

## 2023-06-24 NOTE — Progress Notes (Signed)
[facility] OFFICE PROGRESS NOTE   Diagnosis: Pancreas cancer  INTERVAL HISTORY:   Marcus [name] returns as scheduled.  He completed cycle 11 gemcitabine/Abraxane 06/10/2023.  He denies nausea/vomiting.  No mouth sores.  No fever or rash after treatment.  He took 3 doses of mirtazapine and developed diarrhea.  He subsequently discontinued mirtazapine.  Objective:  Vital signs in last 24 hours:  Blood pressure (!) 156/66, pulse 63, temperature 98.1 F (36.7 C), temperature source Temporal, resp. rate 18, height 5\' 8"  (1.727 m), weight 218 lb 11.2 oz (99.2 kg), SpO2 100%.    HEENT: No thrush or ulcers. Resp: Lungs clear bilaterally. Cardio: Regular rate and rhythm. GI: Abdomen nontender.  No hepatosplenomegaly. Vascular: Trace pitting edema lower leg bilaterally. Port-A-Cath without erythema.  Lab Results:  Lab Results  Component Value Date   WBC 5.1 06/10/2023   HGB 8.8 (L) 06/10/2023   HCT 28.2 (L) 06/10/2023   MCV 90.4 06/10/2023   PLT 209 06/10/2023   NEUTROABS 3.4 06/10/2023    Imaging:  No results found.  Medications: I have reviewed the patient's current medications.  Assessment/Plan: Pancreas cancer CT abdomen/pelvis 12/03/2022-suspected pancreas mass in the proximal body measuring 2 cm with probable mass effect on the superior aspect of the SMV, multiple hypodense liver lesions, many are new from a remote CT-indeterminate, 2 cystic lesions in the pancreas tail-nonspecific prior bariatric surgery ERCP 12/13/2022-malignant appearing common bile duct stricture, plastic stent placed, brushing cytology-no malignant cells EUS 12/23/2022-30 x 33 mm pancreas head mass with invasion of the SMV, no malignant appearing lymph nodes, 4 cysts in the visualized liver, FNA-adenocarcinoma Cycle 1 gemcitabine and abraxane 01/07/2023 CTs 01/09/2023-multiple low-attenuation liver lesions unchanged, largest of which are clearly benign fluid attenuation cyst, others more  ill-defined and incompletely characterized suspicious for small metastases.  Similar appearance of hypodense mass in the pancreatic head.  Distended gallbladder with mild gallbladder wall thickening and adjacent pericholecystic fat stranding.  Sludge in the gallbladder fundus.  Common bile duct stent in place with diminished, persistent intrahepatic biliary ductal dilatation.  Negative for pulmonary embolism.  Acute appearing heterogeneous airspace opacity of the superior segment left lower lobe consistent with infection or aspiration. Cycle 2 gemcitabine and abraxane 01/21/2023, gemcitabine dose reduced due to neutropenia following cycle 1 Cycle 3 gemcitabine/Abraxane 02/04/2023 Cycle 4 gemcitabine/Abraxane 02/17/2023 Cycle 5 gemcitabine/Abraxane 03/04/2023 Cycle 6 gemcitabine/Abraxane 03/17/2023 CT abdomen/pelvis 03/28/2023-stable pancreas mass, multiple small hypoattenuating liver lesions (review of CTs at the GI tumor conference is most consistent with liver metastases-some smaller compared to the baseline CT) Cycle 7 gemcitabine/Abraxane 04/01/2023 Cycle 8 gemcitabine/Abraxane 04/15/2023 Cycle 9 gemcitabine/Abraxane 04/29/2023 Treatment held per patient request 05/12/2023 Cycle 10 gemcitabine/Abraxane 05/27/2023 Cycle 11 gemcitabine/Abraxane 06/10/2023 Cycle 12 gemcitabine/Abraxane 06/24/2023 Biliary obstruction secondary to 1-status post ERCP/stent placement 12/13/2022 Coronary artery disease Atrial fibrillation Gout Sleep apnea Status post bariatric surgery Hypertension Bronchiectasis Gastroesophageal reflux disease History of colon cancer in his 40s Complete heart block-pacemaker placed Admission 01/09/2023 with a fever and failure to thrive, CT chest with evidence of left lung pneumonia, CT abdomen/pelvis with a distended gallbladder and adjacent fat stranding Anemia    Disposition: Marcus [name] appears unchanged.  He has completed 11 cycles of gemcitabine/Abraxane.  He continues to tolerate  treatment well.  There is no clinical evidence of disease progression.  Plan to proceed with cycle 12 today as scheduled.  Restaging CTs prior to next office visit.  CBC and chemistry panel reviewed.  Labs adequate to proceed with treatment.  He will  return for follow-up and treatment in 2 weeks.  We are available to see him sooner if needed.  Lonna Cobb ANP/GNP-BC   06/24/2023  8:41 AM

## 2023-06-24 NOTE — Progress Notes (Signed)
Patient seen by Lonna Cobb NP today  Vitals are within treatment parameters:Yes   Labs are within treatment parameters: Yes creatinine 1.29  Treatment plan has been signed: Yes   Per physician team, Patient is ready for treatment and there are NO modifications to the treatment plan.

## 2023-06-24 NOTE — Patient Instructions (Signed)
 [facility] A DEPT OF [facility]   Discharge Instructions: Thank you for choosing [facility] to provide your oncology and hematology care.   If you have a lab appointment with the Cancer Center, please go directly to the Cancer Center and check in at the registration area.   Wear comfortable clothing and clothing appropriate for easy access to any Portacath or PICC line.   We strive to give you quality time with your provider. You may need to reschedule your appointment if you arrive late (15 or more minutes).  Arriving late affects you and other patients whose appointments are after yours.  Also, if you miss three or more appointments without notifying the office, you may be dismissed from the clinic at the provider's discretion.      For prescription refill requests, have your pharmacy contact our office and allow 72 hours for refills to be completed.    Today you received the following chemotherapy and/or immunotherapy agents Paclitaxel-protein bound (ABRAXANE) & Gemcitabine (GEMZAR).      To help prevent nausea and vomiting after your treatment, we encourage you to take your nausea medication as directed.  BELOW ARE SYMPTOMS THAT SHOULD BE REPORTED IMMEDIATELY: *FEVER GREATER THAN 100.4 F (38 C) OR HIGHER *CHILLS OR SWEATING *NAUSEA AND VOMITING THAT IS NOT CONTROLLED WITH YOUR NAUSEA MEDICATION *UNUSUAL SHORTNESS OF BREATH *UNUSUAL BRUISING OR BLEEDING *URINARY PROBLEMS (pain or burning when urinating, or frequent urination) *BOWEL PROBLEMS (unusual diarrhea, constipation, pain near the anus) TENDERNESS IN MOUTH AND THROAT WITH OR WITHOUT PRESENCE OF ULCERS (sore throat, sores in mouth, or a toothache) UNUSUAL RASH, SWELLING OR PAIN  UNUSUAL VAGINAL DISCHARGE OR ITCHING   Items with * indicate a potential emergency and should be followed up as soon as possible or go to the Emergency Department if any problems should occur.  Please  show the CHEMOTHERAPY ALERT CARD or IMMUNOTHERAPY ALERT CARD at check-in to the Emergency Department and triage nurse.  Should you have questions after your visit or need to cancel or reschedule your appointment, please contact [facility]  Dept: [phone]  and follow the prompts.  Office hours are 8:00 a.m. to 4:30 p.m. Monday - Friday. Please note that voicemails left after 4:00 p.m. may not be returned until the following business day.  We are closed weekends and major holidays. You have access to a nurse at all times for urgent questions. Please call the main number to the clinic Dept: [phone] and follow the prompts.   For any non-urgent questions, you may also contact your provider using MyChart. We now offer e-Visits for anyone 52 and older to request care online for non-urgent symptoms. For details visit [email or web address].   Also download the MyChart app! Go to the app store, search "MyChart", open the app, select [place], and log in with your MyChart username and password.   Paclitaxel Nanoparticle Albumin-Bound Injection What is this medication? NANOPARTICLE ALBUMIN-BOUND PACLITAXEL (Na no PAHR ti kuhl al BYOO muhn-bound PAK li TAX el) treats some types of cancer. It works by slowing down the growth of cancer cells. This medicine may be used for other purposes; ask your health care provider or pharmacist if you have questions. COMMON BRAND NAME(S): Abraxane What should I tell my care team before I take this medication? They need to know if you have any of these conditions: Liver disease Low white blood  cell levels An unusual or allergic reaction to paclitaxel, albumin, other medications, foods, dyes, or preservatives If you or your partner are pregnant or trying to get pregnant Breast-feeding How should I use this medication? This medication is injected into a vein. It is given by your care team in a hospital or  clinic setting. Talk to your care team about the use of this medication in children. Special care may be needed. Overdosage: If you think you have taken too much of this medicine contact a poison control center or emergency room at once. NOTE: This medicine is only for you. Do not share this medicine with others. What if I miss a dose? Keep appointments for follow-up doses. It is important not to miss your dose. Call your care team if you are unable to keep an appointment. What may interact with this medication? Other medications may affect the way this medication works. Talk with your care team about all of the medications you take. They may suggest changes to your treatment plan to lower the risk of side effects and to make sure your medications work as intended. This list may not describe all possible interactions. Give your health care provider a list of all the medicines, herbs, non-prescription drugs, or dietary supplements you use. Also tell them if you smoke, drink alcohol, or use illegal drugs. Some items may interact with your medicine. What should I watch for while using this medication? Your condition will be monitored carefully while you are receiving this medication. You may need blood work while taking this medication. This medication may make you feel generally unwell. This is not uncommon as chemotherapy can affect healthy cells as well as cancer cells. Report any side effects. Continue your course of treatment even though you feel ill unless your care team tells you to stop. This medication can cause serious allergic reactions. To reduce the risk, your care team may give you other medications to take before receiving this one. Be sure to follow the directions from your care team. This medication may increase your risk of getting an infection. Call your care team for advice if you get a fever, chills, sore throat, or other symptoms of a cold or flu. Do not treat yourself. Try to avoid  being around people who are sick. This medication may increase your risk to bruise or bleed. Call your care team if you notice any unusual bleeding. Be careful brushing or flossing your teeth or using a toothpick because you may get an infection or bleed more easily. If you have any dental work done, tell your dentist you are receiving this medication. Talk to your care team if you or your partner may be pregnant. Serious birth defects can occur if you take this medication during pregnancy and for 6 months after the last dose. You will need a negative pregnancy test before starting this medication. Contraception is recommended while taking this medication and for 6 months after the last dose. Your care team can help you find the option that works for you. If your partner can get pregnant, use a condom during sex while taking this medication and for 3 months after the last dose. Do not breastfeed while taking this medication and for 2 weeks after the last dose. This medication may cause infertility. Talk to your care team if you are concerned about your fertility. What side effects may I notice from receiving this medication? Side effects that you should report to your care team as soon as  possible: Allergic reactions--skin rash, itching, hives, swelling of the face, lips, tongue, or throat Dry cough, shortness of breath or trouble breathing Infection--fever, chills, cough, sore throat, wounds that don't heal, pain or trouble when passing urine, general feeling of discomfort or being unwell Low red blood cell level--unusual weakness or fatigue, dizziness, headache, trouble breathing Pain, tingling, or numbness in the hands or feet Stomach pain, unusual weakness or fatigue, nausea, vomiting, diarrhea, or fever that lasts longer than expected Unusual bruising or bleeding Side effects that usually do not require medical attention (report to your care team if they continue or are  bothersome): Diarrhea Fatigue Hair loss Loss of appetite Nausea Vomiting This list may not describe all possible side effects. Call your doctor for medical advice about side effects. You may report side effects to FDA at 1-800-FDA-1088. Where should I keep my medication? This medication is given in a hospital or clinic. It will not be stored at home. NOTE: This sheet is a summary. It may not cover all possible information. If you have questions about this medicine, talk to your doctor, pharmacist, or health care provider.  2024 Elsevier/Gold Standard (2021-11-05 00:00:00)  Gemcitabine Injection What is this medication? GEMCITABINE (jem SYE ta been) treats some types of cancer. It works by slowing down the growth of cancer cells. This medicine may be used for other purposes; ask your health care provider or pharmacist if you have questions. COMMON BRAND NAME(S): Gemzar, Infugem What should I tell my care team before I take this medication? They need to know if you have any of these conditions: Blood disorders Infection Kidney disease Liver disease Lung or breathing disease, such as asthma or COPD Recent or ongoing radiation therapy An unusual or allergic reaction to gemcitabine, other medications, foods, dyes, or preservatives If you or your partner are pregnant or trying to get pregnant Breast-feeding How should I use this medication? This medication is injected into a vein. It is given by your care team in a hospital or clinic setting. Talk to your care team about the use of this medication in children. Special care may be needed. Overdosage: If you think you have taken too much of this medicine contact a poison control center or emergency room at once. NOTE: This medicine is only for you. Do not share this medicine with others. What if I miss a dose? Keep appointments for follow-up doses. It is important not to miss your dose. Call your care team if you are unable to keep an  appointment. What may interact with this medication? Interactions have not been studied. This list may not describe all possible interactions. Give your health care provider a list of all the medicines, herbs, non-prescription drugs, or dietary supplements you use. Also tell them if you smoke, drink alcohol, or use illegal drugs. Some items may interact with your medicine. What should I watch for while using this medication? Your condition will be monitored carefully while you are receiving this medication. This medication may make you feel generally unwell. This is not uncommon, as chemotherapy can affect healthy cells as well as cancer cells. Report any side effects. Continue your course of treatment even though you feel ill unless your care team tells you to stop. In some cases, you may be given additional medications to help with side effects. Follow all directions for their use. This medication may increase your risk of getting an infection. Call your care team for advice if you get a fever, chills, sore throat, or  other symptoms of a cold or flu. Do not treat yourself. Try to avoid being around people who are sick. This medication may increase your risk to bruise or bleed. Call your care team if you notice any unusual bleeding. Be careful brushing or flossing your teeth or using a toothpick because you may get an infection or bleed more easily. If you have any dental work done, tell your dentist you are receiving this medication. Avoid taking medications that contain aspirin, acetaminophen, ibuprofen, naproxen, or ketoprofen unless instructed by your care team. These medications may hide a fever. Talk to your care team if you or your partner wish to become pregnant or think you might be pregnant. This medication can cause serious birth defects if taken during pregnancy and for 6 months after the last dose. A negative pregnancy test is required before starting this medication. A reliable form of  contraception is recommended while taking this medication and for 6 months after the last dose. Talk to your care team about effective forms of contraception. Do not father a child while taking this medication and for 3 months after the last dose. Use a condom while having sex during this time period. Do not breastfeed while taking this medication and for at least 1 week after the last dose. This medication may cause infertility. Talk to your care team if you are concerned about your fertility. What side effects may I notice from receiving this medication? Side effects that you should report to your care team as soon as possible: Allergic reactions--skin rash, itching, hives, swelling of the face, lips, tongue, or throat Capillary leak syndrome--stomach or muscle pain, unusual weakness or fatigue, feeling faint or lightheaded, decrease in the amount of urine, swelling of the ankles, hands, or feet, trouble breathing Infection--fever, chills, cough, sore throat, wounds that don't heal, pain or trouble when passing urine, general feeling of discomfort or being unwell Liver injury--right upper belly pain, loss of appetite, nausea, light-colored stool, dark yellow or brown urine, yellowing skin or eyes, unusual weakness or fatigue Low red blood cell level--unusual weakness or fatigue, dizziness, headache, trouble breathing Lung injury--shortness of breath or trouble breathing, cough, spitting up blood, chest pain, fever Stomach pain, bloody diarrhea, pale skin, unusual weakness or fatigue, decrease in the amount of urine, which may be signs of hemolytic uremic syndrome Sudden and severe headache, confusion, change in vision, seizures, which may be signs of posterior reversible encephalopathy syndrome (PRES) Unusual bruising or bleeding Side effects that usually do not require medical attention (report to your care team if they continue or are bothersome): Diarrhea Drowsiness Hair loss Nausea Pain,  redness, or swelling with sores inside the mouth or throat Vomiting This list may not describe all possible side effects. Call your doctor for medical advice about side effects. You may report side effects to FDA at 1-800-FDA-1088. Where should I keep my medication? This medication is given in a hospital or clinic. It will not be stored at home. NOTE: This sheet is a summary. It may not cover all possible information. If you have questions about this medicine, talk to your doctor, pharmacist, or health care provider.  2024 Elsevier/Gold Standard (2021-10-27 00:00:00)

## 2023-06-25 LAB — CANCER ANTIGEN 19-9: CA 19-9: 774 U/mL — ABNORMAL HIGH (ref 0–35)

## 2023-06-30 ENCOUNTER — Ambulatory Visit: Payer: No Typology Code available for payment source | Admitting: Adult Health

## 2023-07-01 ENCOUNTER — Inpatient Hospital Stay: Payer: No Typology Code available for payment source

## 2023-07-01 ENCOUNTER — Ambulatory Visit (HOSPITAL_BASED_OUTPATIENT_CLINIC_OR_DEPARTMENT_OTHER)
Admission: RE | Admit: 2023-07-01 | Discharge: 2023-07-01 | Disposition: A | Discharge status: Home / Self Care | Payer: Medicare Other | Source: Ambulatory Visit | Attending: Nurse Practitioner | Admitting: Nurse Practitioner

## 2023-07-01 DIAGNOSIS — K8689 Other specified diseases of pancreas: Secondary | ICD-10-CM | POA: Diagnosis not present

## 2023-07-01 DIAGNOSIS — K769 Liver disease, unspecified: Secondary | ICD-10-CM | POA: Diagnosis not present

## 2023-07-01 DIAGNOSIS — C259 Malignant neoplasm of pancreas, unspecified: Secondary | ICD-10-CM | POA: Insufficient documentation

## 2023-07-01 DIAGNOSIS — Z5111 Encounter for antineoplastic chemotherapy: Secondary | ICD-10-CM | POA: Diagnosis not present

## 2023-07-01 DIAGNOSIS — Z95828 Presence of other vascular implants and grafts: Secondary | ICD-10-CM

## 2023-07-01 MED ORDER — IOHEXOL 300 MG/ML  SOLN
100.0000 mL | Freq: Once | INTRAMUSCULAR | Status: AC | PRN
  Administered 2023-07-01: 100 mL via INTRAVENOUS

## 2023-07-01 MED ORDER — HEPARIN SOD (PORK) LOCK FLUSH 100 UNIT/ML IV SOLN
500.0000 [IU] | INTRAVENOUS | Status: AC | PRN
  Administered 2023-07-01: 500 [IU]

## 2023-07-01 MED ORDER — SODIUM CHLORIDE 0.9% FLUSH
10.0000 mL | INTRAVENOUS | Status: AC | PRN
Start: 2023-07-01 — End: 2023-07-01
  Administered 2023-07-01: 10 mL

## 2023-07-01 NOTE — Addendum Note (Signed)
Addended by: Lorelle Formosa on: 07/01/2023 11:51 AM   Modules accepted: Orders

## 2023-07-02 DIAGNOSIS — R2689 Other abnormalities of gait and mobility: Secondary | ICD-10-CM | POA: Diagnosis not present

## 2023-07-02 DIAGNOSIS — R262 Difficulty in walking, not elsewhere classified: Secondary | ICD-10-CM | POA: Diagnosis not present

## 2023-07-02 DIAGNOSIS — Z9181 History of falling: Secondary | ICD-10-CM | POA: Diagnosis not present

## 2023-07-03 ENCOUNTER — Other Ambulatory Visit: Payer: Self-pay | Admitting: Oncology

## 2023-07-08 ENCOUNTER — Inpatient Hospital Stay: Payer: No Typology Code available for payment source | Attending: Oncology

## 2023-07-08 ENCOUNTER — Inpatient Hospital Stay: Payer: No Typology Code available for payment source | Admitting: Nurse Practitioner

## 2023-07-08 ENCOUNTER — Inpatient Hospital Stay: Payer: No Typology Code available for payment source

## 2023-07-08 ENCOUNTER — Encounter: Payer: Self-pay | Admitting: Nurse Practitioner

## 2023-07-08 VITALS — BP 153/71 | HR 62 | Temp 98.1°F | Resp 18 | Ht 68.0 in | Wt 219.5 lb

## 2023-07-08 DIAGNOSIS — Z950 Presence of cardiac pacemaker: Secondary | ICD-10-CM | POA: Diagnosis not present

## 2023-07-08 DIAGNOSIS — D649 Anemia, unspecified: Secondary | ICD-10-CM | POA: Insufficient documentation

## 2023-07-08 DIAGNOSIS — K219 Gastro-esophageal reflux disease without esophagitis: Secondary | ICD-10-CM

## 2023-07-08 DIAGNOSIS — I4891 Unspecified atrial fibrillation: Secondary | ICD-10-CM

## 2023-07-08 DIAGNOSIS — K769 Liver disease, unspecified: Secondary | ICD-10-CM | POA: Diagnosis not present

## 2023-07-08 DIAGNOSIS — Z85038 Personal history of other malignant neoplasm of large intestine: Secondary | ICD-10-CM | POA: Diagnosis not present

## 2023-07-08 DIAGNOSIS — Z9884 Bariatric surgery status: Secondary | ICD-10-CM | POA: Diagnosis not present

## 2023-07-08 DIAGNOSIS — G4730 Sleep apnea, unspecified: Secondary | ICD-10-CM | POA: Insufficient documentation

## 2023-07-08 DIAGNOSIS — K831 Obstruction of bile duct: Secondary | ICD-10-CM | POA: Diagnosis not present

## 2023-07-08 DIAGNOSIS — J479 Bronchiectasis, uncomplicated: Secondary | ICD-10-CM

## 2023-07-08 DIAGNOSIS — I442 Atrioventricular block, complete: Secondary | ICD-10-CM | POA: Insufficient documentation

## 2023-07-08 DIAGNOSIS — C259 Malignant neoplasm of pancreas, unspecified: Secondary | ICD-10-CM

## 2023-07-08 DIAGNOSIS — I2510 Atherosclerotic heart disease of native coronary artery without angina pectoris: Secondary | ICD-10-CM | POA: Insufficient documentation

## 2023-07-08 DIAGNOSIS — C250 Malignant neoplasm of head of pancreas: Secondary | ICD-10-CM | POA: Diagnosis present

## 2023-07-08 DIAGNOSIS — I10 Essential (primary) hypertension: Secondary | ICD-10-CM

## 2023-07-08 DIAGNOSIS — M109 Gout, unspecified: Secondary | ICD-10-CM | POA: Diagnosis not present

## 2023-07-08 DIAGNOSIS — Z95828 Presence of other vascular implants and grafts: Secondary | ICD-10-CM

## 2023-07-08 LAB — CBC WITH DIFFERENTIAL (CANCER CENTER ONLY)
Abs Immature Granulocytes: 0.04 10*3/uL (ref 0.00–0.07)
Basophils Absolute: 0 10*3/uL (ref 0.0–0.1)
Basophils Relative: 1 %
Eosinophils Absolute: 0.1 10*3/uL (ref 0.0–0.5)
Eosinophils Relative: 3 %
HCT: 28.4 % — ABNORMAL LOW (ref 39.0–52.0)
Hemoglobin: 9 g/dL — ABNORMAL LOW (ref 13.0–17.0)
Immature Granulocytes: 1 %
Lymphocytes Relative: 16 %
Lymphs Abs: 0.6 10*3/uL — ABNORMAL LOW (ref 0.7–4.0)
MCH: 28.1 pg (ref 26.0–34.0)
MCHC: 31.7 g/dL (ref 30.0–36.0)
MCV: 88.8 fL (ref 80.0–100.0)
Monocytes Absolute: 0.6 10*3/uL (ref 0.1–1.0)
Monocytes Relative: 14 %
Neutro Abs: 2.6 10*3/uL (ref 1.7–7.7)
Neutrophils Relative %: 65 %
Platelet Count: 157 10*3/uL (ref 150–400)
RBC: 3.2 MIL/uL — ABNORMAL LOW (ref 4.22–5.81)
RDW: 18.6 % — ABNORMAL HIGH (ref 11.5–15.5)
WBC Count: 4 10*3/uL (ref 4.0–10.5)
nRBC: 0 % (ref 0.0–0.2)

## 2023-07-08 LAB — CMP (CANCER CENTER ONLY)
ALT: 14 U/L (ref 0–44)
AST: 19 U/L (ref 15–41)
Albumin: 3.4 g/dL — ABNORMAL LOW (ref 3.5–5.0)
Alkaline Phosphatase: 78 U/L (ref 38–126)
Anion gap: 6 (ref 5–15)
BUN: 20 mg/dL (ref 8–23)
CO2: 27 mmol/L (ref 22–32)
Calcium: 8.7 mg/dL — ABNORMAL LOW (ref 8.9–10.3)
Chloride: 106 mmol/L (ref 98–111)
Creatinine: 1.04 mg/dL (ref 0.61–1.24)
GFR, Estimated: >60 mL/min (ref >60)
Glucose, Bld: 127 mg/dL — ABNORMAL HIGH (ref 70–99)
Potassium: 3.5 mmol/L (ref 3.5–5.1)
Sodium: 139 mmol/L (ref 135–145)
Total Bilirubin: 0.3 mg/dL (ref 0.0–1.2)
Total Protein: 6.3 g/dL — ABNORMAL LOW (ref 6.5–8.1)

## 2023-07-08 MED ORDER — HEPARIN SOD (PORK) LOCK FLUSH 100 UNIT/ML IV SOLN
500.0000 [IU] | Freq: Once | INTRAVENOUS | Status: AC
  Administered 2023-07-08: 500 [IU] via INTRAVENOUS

## 2023-07-08 MED ORDER — SODIUM CHLORIDE 0.9% FLUSH
10.0000 mL | INTRAVENOUS | Status: DC | PRN
  Administered 2023-07-08: 10 mL via INTRAVENOUS

## 2023-07-08 NOTE — Progress Notes (Signed)
 [facility] OFFICE PROGRESS NOTE   Diagnosis: Pancreas cancer  INTERVAL HISTORY:   Marcus [name] returns as scheduled.  He completed another cycle of gemcitabine /Abraxane  06/24/2023.  He tends to not feel well with on day 3.  No specific complaints.  No fever or rash after treatment.  In general he does not have nausea.  He did note some nausea this morning.  Stable numbness in his toes.  Predated chemotherapy.  Had an episode of abdominal pain this morning.  Similar episode 2 to 3 weeks ago.  He had a recent fall.  Objective:  Vital signs in last 24 hours:  Blood pressure (!) 153/71, pulse 62, temperature 98.1 F (36.7 C), temperature source Temporal, resp. rate 18, height 5' 8 (1.727 m), weight 219 lb 8 oz (99.6 kg), SpO2 100%.    HEENT: No thrush or ulcers. Resp: Lungs clear bilaterally. Cardio: Regular rate and rhythm. GI: Abdomen soft and nontender.  No hepatosplenomegaly.  No apparent ascites. Vascular: Trace lower leg edema bilaterally.  Port-A-Cath without erythema.  Lab Results:  Lab Results  Component Value Date   WBC 4.0 07/08/2023   HGB 9.0 (L) 07/08/2023   HCT 28.4 (L) 07/08/2023   MCV 88.8 07/08/2023   PLT 157 07/08/2023   NEUTROABS 2.6 07/08/2023    Imaging:  No results found.  Medications: I have reviewed the patient's current medications.  Assessment/Plan: Pancreas cancer CT abdomen/pelvis 12/03/2022-suspected pancreas mass in the proximal body measuring 2 cm with probable mass effect on the superior aspect of the SMV, multiple hypodense liver lesions, many are new from a remote CT-indeterminate, 2 cystic lesions in the pancreas tail-nonspecific prior bariatric surgery ERCP 12/13/2022-malignant appearing common bile duct stricture, plastic stent placed, brushing cytology-no malignant cells EUS 12/23/2022-30 x 33 mm pancreas head mass with invasion of the SMV, no malignant appearing lymph nodes, 4 cysts in the visualized liver,  FNA-adenocarcinoma Cycle 1 gemcitabine  and abraxane  01/07/2023 CTs 01/09/2023-multiple low-attenuation liver lesions unchanged, largest of which are clearly benign fluid attenuation cyst, others more ill-defined and incompletely characterized suspicious for small metastases.  Similar appearance of hypodense mass in the pancreatic head.  Distended gallbladder with mild gallbladder wall thickening and adjacent pericholecystic fat stranding.  Sludge in the gallbladder fundus.  Common bile duct stent in place with diminished, persistent intrahepatic biliary ductal dilatation.  Negative for pulmonary embolism.  Acute appearing heterogeneous airspace opacity of the superior segment left lower lobe consistent with infection or aspiration. Cycle 2 gemcitabine  and abraxane  01/21/2023, gemcitabine  dose reduced due to neutropenia following cycle 1 Cycle 3 gemcitabine /Abraxane  02/04/2023 Cycle 4 gemcitabine /Abraxane  02/17/2023 Cycle 5 gemcitabine /Abraxane  03/04/2023 Cycle 6 gemcitabine /Abraxane  03/17/2023 CT abdomen/pelvis 03/28/2023-stable pancreas mass, multiple small hypoattenuating liver lesions (review of CTs at the GI tumor conference is most consistent with liver metastases-some smaller compared to the baseline CT) Cycle 7 gemcitabine /Abraxane  04/01/2023 Cycle 8 gemcitabine /Abraxane  04/15/2023 Cycle 9 gemcitabine /Abraxane  04/29/2023 Treatment held per patient request 05/12/2023 Cycle 10 gemcitabine /Abraxane  05/27/2023 Cycle 11 gemcitabine /Abraxane  06/10/2023 Cycle 12 gemcitabine /Abraxane  06/24/2023 CTs 07/01/2023-no significant difference in size of the mass in the pancreatic head.  Redemonstration of multiple hypoattenuating liver lesions essentially unchanged and favored to represent simple cysts. 07/08/2023-treatment held, referred for MRI to evaluate liver lesions Biliary obstruction secondary to 1-status post ERCP/stent placement 12/13/2022 Coronary artery disease Atrial fibrillation Gout Sleep apnea Status  post bariatric surgery Hypertension Bronchiectasis Gastroesophageal reflux disease History of colon cancer in his 40s Complete heart block-pacemaker placed Admission 01/09/2023 with a fever and failure to thrive, CT  chest with evidence of left lung pneumonia, CT abdomen/pelvis with a distended gallbladder and adjacent fat stranding Anemia  Disposition: Marcus [name] appears stable.  He has completed 12 cycles of gemcitabine /Abraxane .  Recent restaging CTs show no significant difference in the size of the pancreatic mass, liver lesions felt to represent cysts.  We decided to hold treatment today and refer for an MRI to further evaluate the liver lesions.  We had preliminary discussion regarding radiation/Xeloda  to the pancreas mass if the liver lesions are determined to be cysts per MRI.  His case will be presented at the GI tumor conference once the MRI has been completed.  He and his daughter are in agreement with this plan.  Of note, he has a pacemaker.  He has had MRIs in the past.  We have contacted the radiology department to make them aware of the pacemaker.    He will return for follow-up as scheduled in 2 weeks.  Patient seen with Dr. Cloretta.  CT images reviewed with Marcus [name] and his daughter on the computer at today's visit.  Olam Ned ANP/GNP-BC   07/08/2023  9:09 AM  This was a shared visit with Olam Ned.  We reviewed the restaging CT findings and images with Marcus [name] and his daughter.  The CTs are consistent with stable disease.  We have reviewed multiple CTs with radiology in the past and the liver lesions are felt to represent cysts and metastases.  It is possible he does not have metastatic disease.  We discussed this with Marcus [name] today.  He agrees to an MRI for better characterization the liver lesions.  We will consider a diagnostic biopsy as indicated.  We also discussed potential treatment with radiation if the MRI does not suggest metastatic disease.  He will return  for an office visit after the staging MRI scan.  I was present for greater than 50% of today's visit.  I performed medical decision making.  Arvella Cloretta, MD

## 2023-07-08 NOTE — Addendum Note (Signed)
 Addended by: Stephanie Coup S on: 07/08/2023 10:00 AM   Modules accepted: Orders

## 2023-07-09 LAB — CANCER ANTIGEN 19-9: CA 19-9: 849 U/mL — ABNORMAL HIGH (ref 0–35)

## 2023-07-12 ENCOUNTER — Telehealth: Payer: Self-pay

## 2023-07-12 NOTE — Telephone Encounter (Signed)
 The patient consented to initiate chemotherapy on July 22, 2023.

## 2023-07-12 NOTE — Telephone Encounter (Signed)
-----   Message from Lonna Cobb sent at 07/12/2023  4:22 PM EST ----- Please let him know Dr. Truett Perna may recommend going ahead with chemotherapy on 07/22/2023 since MRI couldn't be scheduled until the end of the month.

## 2023-07-13 ENCOUNTER — Encounter: Payer: Self-pay | Admitting: *Deleted

## 2023-07-13 ENCOUNTER — Other Ambulatory Visit (HOSPITAL_BASED_OUTPATIENT_CLINIC_OR_DEPARTMENT_OTHER): Payer: Self-pay

## 2023-07-13 NOTE — Progress Notes (Signed)
 [place] form [id number] for [facility] has been filled out with supporting clinic notes, labs and most recent scan and emailed to the [name] @ [email or web address]

## 2023-07-22 ENCOUNTER — Inpatient Hospital Stay: Payer: No Typology Code available for payment source

## 2023-07-22 ENCOUNTER — Inpatient Hospital Stay (HOSPITAL_BASED_OUTPATIENT_CLINIC_OR_DEPARTMENT_OTHER): Payer: No Typology Code available for payment source | Admitting: Oncology

## 2023-07-22 VITALS — BP 144/62 | HR 61 | Temp 98.1°F | Resp 18 | Ht 68.0 in | Wt 217.0 lb

## 2023-07-22 DIAGNOSIS — I10 Essential (primary) hypertension: Secondary | ICD-10-CM

## 2023-07-22 DIAGNOSIS — Z950 Presence of cardiac pacemaker: Secondary | ICD-10-CM

## 2023-07-22 DIAGNOSIS — Z85038 Personal history of other malignant neoplasm of large intestine: Secondary | ICD-10-CM

## 2023-07-22 DIAGNOSIS — C259 Malignant neoplasm of pancreas, unspecified: Secondary | ICD-10-CM

## 2023-07-22 DIAGNOSIS — K769 Liver disease, unspecified: Secondary | ICD-10-CM | POA: Diagnosis not present

## 2023-07-22 DIAGNOSIS — I4891 Unspecified atrial fibrillation: Secondary | ICD-10-CM | POA: Diagnosis not present

## 2023-07-22 DIAGNOSIS — Z95828 Presence of other vascular implants and grafts: Secondary | ICD-10-CM

## 2023-07-22 DIAGNOSIS — G4730 Sleep apnea, unspecified: Secondary | ICD-10-CM

## 2023-07-22 DIAGNOSIS — C250 Malignant neoplasm of head of pancreas: Secondary | ICD-10-CM | POA: Diagnosis not present

## 2023-07-22 DIAGNOSIS — I2510 Atherosclerotic heart disease of native coronary artery without angina pectoris: Secondary | ICD-10-CM | POA: Diagnosis not present

## 2023-07-22 DIAGNOSIS — M109 Gout, unspecified: Secondary | ICD-10-CM

## 2023-07-22 DIAGNOSIS — J479 Bronchiectasis, uncomplicated: Secondary | ICD-10-CM

## 2023-07-22 DIAGNOSIS — D649 Anemia, unspecified: Secondary | ICD-10-CM

## 2023-07-22 DIAGNOSIS — K219 Gastro-esophageal reflux disease without esophagitis: Secondary | ICD-10-CM

## 2023-07-22 DIAGNOSIS — Z9884 Bariatric surgery status: Secondary | ICD-10-CM

## 2023-07-22 LAB — CBC WITH DIFFERENTIAL (CANCER CENTER ONLY)
Abs Immature Granulocytes: 0.06 10*3/uL (ref 0.00–0.07)
Basophils Absolute: 0 10*3/uL (ref 0.0–0.1)
Basophils Relative: 0 %
Eosinophils Absolute: 0.1 10*3/uL (ref 0.0–0.5)
Eosinophils Relative: 2 %
HCT: 30.3 % — ABNORMAL LOW (ref 39.0–52.0)
Hemoglobin: 9.7 g/dL — ABNORMAL LOW (ref 13.0–17.0)
Immature Granulocytes: 1 %
Lymphocytes Relative: 18 %
Lymphs Abs: 0.9 10*3/uL (ref 0.7–4.0)
MCH: 28.1 pg (ref 26.0–34.0)
MCHC: 32 g/dL (ref 30.0–36.0)
MCV: 87.8 fL (ref 80.0–100.0)
Monocytes Absolute: 0.6 10*3/uL (ref 0.1–1.0)
Monocytes Relative: 12 %
Neutro Abs: 3.3 10*3/uL (ref 1.7–7.7)
Neutrophils Relative %: 67 %
Platelet Count: 224 10*3/uL (ref 150–400)
RBC: 3.45 MIL/uL — ABNORMAL LOW (ref 4.22–5.81)
RDW: 17.7 % — ABNORMAL HIGH (ref 11.5–15.5)
WBC Count: 5 10*3/uL (ref 4.0–10.5)
nRBC: 0 % (ref 0.0–0.2)

## 2023-07-22 LAB — CMP (CANCER CENTER ONLY)
ALT: 9 U/L (ref 0–44)
AST: 13 U/L — ABNORMAL LOW (ref 15–41)
Albumin: 3.5 g/dL (ref 3.5–5.0)
Alkaline Phosphatase: 66 U/L (ref 38–126)
Anion gap: 7 (ref 5–15)
BUN: 23 mg/dL (ref 8–23)
CO2: 27 mmol/L (ref 22–32)
Calcium: 9.2 mg/dL (ref 8.9–10.3)
Chloride: 105 mmol/L (ref 98–111)
Creatinine: 0.95 mg/dL (ref 0.61–1.24)
GFR, Estimated: >60 mL/min (ref >60)
Glucose, Bld: 113 mg/dL — ABNORMAL HIGH (ref 70–99)
Potassium: 3.7 mmol/L (ref 3.5–5.1)
Sodium: 139 mmol/L (ref 135–145)
Total Bilirubin: 0.3 mg/dL (ref 0.0–1.2)
Total Protein: 6.5 g/dL (ref 6.5–8.1)

## 2023-07-22 MED ORDER — SODIUM CHLORIDE 0.9% FLUSH
10.0000 mL | INTRAVENOUS | Status: DC | PRN
  Administered 2023-07-22: 10 mL via INTRAVENOUS

## 2023-07-22 MED ORDER — HEPARIN SOD (PORK) LOCK FLUSH 100 UNIT/ML IV SOLN
500.0000 [IU] | Freq: Once | INTRAVENOUS | Status: AC
  Administered 2023-07-22: 500 [IU] via INTRAVENOUS

## 2023-07-22 NOTE — Progress Notes (Signed)
[facility] OFFICE PROGRESS NOTE   Diagnosis: Pancreas cancer  INTERVAL HISTORY:   Marcus [name] returns as scheduled.  He complains of malaise.  No pain.  He says that he does not feel well, but does not have specific complaints.  He does not wish to receive chemotherapy today.  The liver MRI is scheduled 08/05/2023.  Objective:  Vital signs in last 24 hours:  Blood pressure (!) 144/62, pulse 61, temperature 98.1 F (36.7 C), temperature source Temporal, resp. rate 18, height 5\' 8"  (1.727 m), weight 217 lb (98.4 kg), SpO2 100%.     Resp: Lungs clear bilaterally Cardio: Regular rate and rhythm GI: No mass, no hepatosplenomegaly, no apparent ascites Vascular: Trace pitting edema to lower leg bilaterally   Portacath/PICC-without erythema  Lab Results:  Lab Results  Component Value Date   WBC 5.0 07/22/2023   HGB 9.7 (L) 07/22/2023   HCT 30.3 (L) 07/22/2023   MCV 87.8 07/22/2023   PLT 224 07/22/2023   NEUTROABS 3.3 07/22/2023    CMP  Lab Results  Component Value Date   NA 139 07/22/2023   K 3.7 07/22/2023   CL 105 07/22/2023   CO2 27 07/22/2023   GLUCOSE 113 (H) 07/22/2023   BUN 23 07/22/2023   CREATININE 0.95 07/22/2023   CALCIUM 9.2 07/22/2023   PROT 6.5 07/22/2023   ALBUMIN 3.5 07/22/2023   AST 13 (L) 07/22/2023   ALT 9 07/22/2023   ALKPHOS 66 07/22/2023   BILITOT 0.3 07/22/2023   GFRNONAA >60 07/22/2023   GFRAA >60 02/07/2019    Lab Results  Component Value Date   CEA1 6.4 (H) 12/11/2022   CEA 1.1 03/03/2010   CAN199 849 (H) 07/08/2023    Lab Results  Component Value Date   INR 1.3 (H) 01/09/2023   LABPROT 16.0 (H) 01/09/2023    Imaging:  No results found.  Medications: I have reviewed the patient's current medications.   Assessment/Plan: Pancreas cancer CT abdomen/pelvis 12/03/2022-suspected pancreas mass in the proximal body measuring 2 cm with probable mass effect on the superior aspect of the SMV, multiple hypodense  liver lesions, many are new from a remote CT-indeterminate, 2 cystic lesions in the pancreas tail-nonspecific prior bariatric surgery ERCP 12/13/2022-malignant appearing common bile duct stricture, plastic stent placed, brushing cytology-no malignant cells EUS 12/23/2022-30 x 33 mm pancreas head mass with invasion of the SMV, no malignant appearing lymph nodes, 4 cysts in the visualized liver, FNA-adenocarcinoma Cycle 1 gemcitabine and abraxane 01/07/2023 CTs 01/09/2023-multiple low-attenuation liver lesions unchanged, largest of which are clearly benign fluid attenuation cyst, others more ill-defined and incompletely characterized suspicious for small metastases.  Similar appearance of hypodense mass in the pancreatic head.  Distended gallbladder with mild gallbladder wall thickening and adjacent pericholecystic fat stranding.  Sludge in the gallbladder fundus.  Common bile duct stent in place with diminished, persistent intrahepatic biliary ductal dilatation.  Negative for pulmonary embolism.  Acute appearing heterogeneous airspace opacity of the superior segment left lower lobe consistent with infection or aspiration. Cycle 2 gemcitabine and abraxane 01/21/2023, gemcitabine dose reduced due to neutropenia following cycle 1 Cycle 3 gemcitabine/Abraxane 02/04/2023 Cycle 4 gemcitabine/Abraxane 02/17/2023 Cycle 5 gemcitabine/Abraxane 03/04/2023 Cycle 6 gemcitabine/Abraxane 03/17/2023 CT abdomen/pelvis 03/28/2023-stable pancreas mass, multiple small hypoattenuating liver lesions (review of CTs at the GI tumor conference is most consistent with liver metastases-some smaller compared to the baseline CT) Cycle 7 gemcitabine/Abraxane 04/01/2023 Cycle 8 gemcitabine/Abraxane 04/15/2023 Cycle 9 gemcitabine/Abraxane 04/29/2023 Treatment held per patient request 05/12/2023 Cycle 10 gemcitabine/Abraxane  05/27/2023 Cycle 11 gemcitabine/Abraxane 06/10/2023 Cycle 12 gemcitabine/Abraxane 06/24/2023 CTs 07/01/2023-no significant  difference in size of the mass in the pancreatic head.  Redemonstration of multiple hypoattenuating liver lesions essentially unchanged and favored to represent simple cysts. 07/08/2023-treatment held, referred for MRI to evaluate liver lesions Biliary obstruction secondary to 1-status post ERCP/stent placement 12/13/2022 Coronary artery disease Atrial fibrillation Gout Sleep apnea Status post bariatric surgery Hypertension Bronchiectasis Gastroesophageal reflux disease History of colon cancer in his 40s Complete heart block-pacemaker placed Admission 01/09/2023 with a fever and failure to thrive, CT chest with evidence of left lung pneumonia, CT abdomen/pelvis with a distended gallbladder and adjacent fat stranding Anemia   Disposition: Marcus [name] appears stable.  He does not wish to receive chemotherapy today.  He is scheduled for a liver MRI on 08/05/2023 to determine whether the known liver lesions are metastases.  He will return for an office visit a few days later to discuss treatment options.  Thornton Papas, MD  07/22/2023  9:22 AM

## 2023-07-22 NOTE — Progress Notes (Signed)
Marcus [name] will not have treatment today per Dr. Truett Perna. Will have MRI before next treatment.

## 2023-07-28 ENCOUNTER — Other Ambulatory Visit (HOSPITAL_BASED_OUTPATIENT_CLINIC_OR_DEPARTMENT_OTHER): Payer: Self-pay

## 2023-07-29 ENCOUNTER — Other Ambulatory Visit: Payer: Self-pay | Admitting: *Deleted

## 2023-07-29 DIAGNOSIS — C259 Malignant neoplasm of pancreas, unspecified: Secondary | ICD-10-CM

## 2023-08-02 DIAGNOSIS — Z9181 History of falling: Secondary | ICD-10-CM | POA: Diagnosis not present

## 2023-08-02 DIAGNOSIS — R2689 Other abnormalities of gait and mobility: Secondary | ICD-10-CM | POA: Diagnosis not present

## 2023-08-02 DIAGNOSIS — R262 Difficulty in walking, not elsewhere classified: Secondary | ICD-10-CM | POA: Diagnosis not present

## 2023-08-05 ENCOUNTER — Ambulatory Visit (HOSPITAL_COMMUNITY)
Admission: RE | Admit: 2023-08-05 | Discharge: 2023-08-05 | Disposition: A | Discharge status: Home / Self Care | Payer: Medicare Other | Source: Ambulatory Visit | Attending: Nurse Practitioner | Admitting: Nurse Practitioner

## 2023-08-05 DIAGNOSIS — K8020 Calculus of gallbladder without cholecystitis without obstruction: Secondary | ICD-10-CM | POA: Diagnosis not present

## 2023-08-05 DIAGNOSIS — I700 Atherosclerosis of aorta: Secondary | ICD-10-CM | POA: Diagnosis not present

## 2023-08-05 DIAGNOSIS — C259 Malignant neoplasm of pancreas, unspecified: Secondary | ICD-10-CM | POA: Diagnosis not present

## 2023-08-05 DIAGNOSIS — R140 Abdominal distension (gaseous): Secondary | ICD-10-CM | POA: Diagnosis not present

## 2023-08-05 MED ORDER — GADOBUTROL 1 MMOL/ML IV SOLN
10.0000 mL | Freq: Once | INTRAVENOUS | Status: AC | PRN
  Administered 2023-08-05: 10 mL via INTRAVENOUS

## 2023-08-05 MED ORDER — HEPARIN SOD (PORK) LOCK FLUSH 100 UNIT/ML IV SOLN
500.0000 [IU] | INTRAVENOUS | Status: AC | PRN
  Administered 2023-08-05: 500 [IU]
  Filled 2023-08-05: qty 5

## 2023-08-07 ENCOUNTER — Encounter: Payer: Self-pay | Admitting: Oncology

## 2023-08-07 DIAGNOSIS — R112 Nausea with vomiting, unspecified: Secondary | ICD-10-CM | POA: Insufficient documentation

## 2023-08-07 DIAGNOSIS — Z7901 Long term (current) use of anticoagulants: Secondary | ICD-10-CM | POA: Diagnosis not present

## 2023-08-07 DIAGNOSIS — Z8507 Personal history of malignant neoplasm of pancreas: Secondary | ICD-10-CM | POA: Insufficient documentation

## 2023-08-07 DIAGNOSIS — Z8546 Personal history of malignant neoplasm of prostate: Secondary | ICD-10-CM | POA: Insufficient documentation

## 2023-08-07 DIAGNOSIS — Z20822 Contact with and (suspected) exposure to covid-19: Secondary | ICD-10-CM | POA: Insufficient documentation

## 2023-08-07 DIAGNOSIS — I2510 Atherosclerotic heart disease of native coronary artery without angina pectoris: Secondary | ICD-10-CM | POA: Insufficient documentation

## 2023-08-07 DIAGNOSIS — R1032 Left lower quadrant pain: Secondary | ICD-10-CM | POA: Insufficient documentation

## 2023-08-07 DIAGNOSIS — I4891 Unspecified atrial fibrillation: Secondary | ICD-10-CM | POA: Insufficient documentation

## 2023-08-07 DIAGNOSIS — J449 Chronic obstructive pulmonary disease, unspecified: Secondary | ICD-10-CM | POA: Diagnosis not present

## 2023-08-08 ENCOUNTER — Other Ambulatory Visit (HOSPITAL_BASED_OUTPATIENT_CLINIC_OR_DEPARTMENT_OTHER): Payer: Self-pay

## 2023-08-08 ENCOUNTER — Inpatient Hospital Stay: Payer: No Typology Code available for payment source

## 2023-08-08 ENCOUNTER — Inpatient Hospital Stay: Payer: No Typology Code available for payment source | Attending: Oncology | Admitting: Oncology

## 2023-08-08 ENCOUNTER — Other Ambulatory Visit: Payer: Self-pay | Admitting: Adult Health

## 2023-08-08 ENCOUNTER — Other Ambulatory Visit: Payer: Self-pay

## 2023-08-08 ENCOUNTER — Emergency Department (HOSPITAL_BASED_OUTPATIENT_CLINIC_OR_DEPARTMENT_OTHER)
Admission: EM | Admit: 2023-08-08 | Discharge: 2023-08-08 | Disposition: A | Discharge status: Home / Self Care | Payer: Non-veteran care | Attending: Emergency Medicine | Admitting: Emergency Medicine

## 2023-08-08 ENCOUNTER — Emergency Department (HOSPITAL_BASED_OUTPATIENT_CLINIC_OR_DEPARTMENT_OTHER): Payer: Non-veteran care

## 2023-08-08 ENCOUNTER — Encounter (HOSPITAL_BASED_OUTPATIENT_CLINIC_OR_DEPARTMENT_OTHER): Payer: Self-pay | Admitting: *Deleted

## 2023-08-08 VITALS — BP 126/69 | HR 63 | Temp 98.1°F | Resp 18 | Ht 68.0 in | Wt 219.0 lb

## 2023-08-08 DIAGNOSIS — R112 Nausea with vomiting, unspecified: Secondary | ICD-10-CM

## 2023-08-08 DIAGNOSIS — L299 Pruritus, unspecified: Secondary | ICD-10-CM | POA: Diagnosis not present

## 2023-08-08 DIAGNOSIS — K219 Gastro-esophageal reflux disease without esophagitis: Secondary | ICD-10-CM | POA: Insufficient documentation

## 2023-08-08 DIAGNOSIS — M109 Gout, unspecified: Secondary | ICD-10-CM

## 2023-08-08 DIAGNOSIS — I10 Essential (primary) hypertension: Secondary | ICD-10-CM

## 2023-08-08 DIAGNOSIS — K769 Liver disease, unspecified: Secondary | ICD-10-CM | POA: Insufficient documentation

## 2023-08-08 DIAGNOSIS — F32A Depression, unspecified: Secondary | ICD-10-CM | POA: Diagnosis not present

## 2023-08-08 DIAGNOSIS — Z950 Presence of cardiac pacemaker: Secondary | ICD-10-CM

## 2023-08-08 DIAGNOSIS — I4891 Unspecified atrial fibrillation: Secondary | ICD-10-CM

## 2023-08-08 DIAGNOSIS — R109 Unspecified abdominal pain: Secondary | ICD-10-CM | POA: Diagnosis not present

## 2023-08-08 DIAGNOSIS — C250 Malignant neoplasm of head of pancreas: Secondary | ICD-10-CM | POA: Diagnosis not present

## 2023-08-08 DIAGNOSIS — Z9884 Bariatric surgery status: Secondary | ICD-10-CM

## 2023-08-08 DIAGNOSIS — I442 Atrioventricular block, complete: Secondary | ICD-10-CM

## 2023-08-08 DIAGNOSIS — Z85038 Personal history of other malignant neoplasm of large intestine: Secondary | ICD-10-CM | POA: Diagnosis not present

## 2023-08-08 DIAGNOSIS — I2510 Atherosclerotic heart disease of native coronary artery without angina pectoris: Secondary | ICD-10-CM | POA: Diagnosis not present

## 2023-08-08 DIAGNOSIS — D649 Anemia, unspecified: Secondary | ICD-10-CM | POA: Diagnosis not present

## 2023-08-08 DIAGNOSIS — K5900 Constipation, unspecified: Secondary | ICD-10-CM | POA: Insufficient documentation

## 2023-08-08 DIAGNOSIS — G4730 Sleep apnea, unspecified: Secondary | ICD-10-CM

## 2023-08-08 DIAGNOSIS — J479 Bronchiectasis, uncomplicated: Secondary | ICD-10-CM | POA: Diagnosis not present

## 2023-08-08 DIAGNOSIS — Z8507 Personal history of malignant neoplasm of pancreas: Secondary | ICD-10-CM

## 2023-08-08 LAB — COMPREHENSIVE METABOLIC PANEL
ALT: 15 U/L (ref 0–44)
AST: 20 U/L (ref 15–41)
Albumin: 3.8 g/dL (ref 3.5–5.0)
Alkaline Phosphatase: 75 U/L (ref 38–126)
Anion gap: 8 (ref 5–15)
BUN: 29 mg/dL — ABNORMAL HIGH (ref 8–23)
CO2: 30 mmol/L (ref 22–32)
Calcium: 9.3 mg/dL (ref 8.9–10.3)
Chloride: 105 mmol/L (ref 98–111)
Creatinine, Ser: 1.13 mg/dL (ref 0.61–1.24)
GFR, Estimated: >60 mL/min (ref >60)
Glucose, Bld: 111 mg/dL — ABNORMAL HIGH (ref 70–99)
Potassium: 3.6 mmol/L (ref 3.5–5.1)
Sodium: 143 mmol/L (ref 135–145)
Total Bilirubin: 0.4 mg/dL (ref 0.0–1.2)
Total Protein: 7.2 g/dL (ref 6.5–8.1)

## 2023-08-08 LAB — URINALYSIS, ROUTINE W REFLEX MICROSCOPIC
Bilirubin Urine: NEGATIVE
Glucose, UA: NEGATIVE mg/dL
Hgb urine dipstick: NEGATIVE
Ketones, ur: NEGATIVE mg/dL
Leukocytes,Ua: NEGATIVE
Nitrite: NEGATIVE
Specific Gravity, Urine: >1.046 — ABNORMAL HIGH (ref 1.005–1.030)
pH: 7.5 (ref 5.0–8.0)

## 2023-08-08 LAB — RESP PANEL BY RT-PCR (RSV, FLU A&B, COVID)  RVPGX2
Influenza A by PCR: NEGATIVE
Influenza B by PCR: NEGATIVE
Resp Syncytial Virus by PCR: NEGATIVE
SARS Coronavirus 2 by RT PCR: NEGATIVE

## 2023-08-08 LAB — CBC
HCT: 34 % — ABNORMAL LOW (ref 39.0–52.0)
Hemoglobin: 10.8 g/dL — ABNORMAL LOW (ref 13.0–17.0)
MCH: 28 pg (ref 26.0–34.0)
MCHC: 31.8 g/dL (ref 30.0–36.0)
MCV: 88.1 fL (ref 80.0–100.0)
Platelets: 159 10*3/uL (ref 150–400)
RBC: 3.86 MIL/uL — ABNORMAL LOW (ref 4.22–5.81)
RDW: 17.5 % — ABNORMAL HIGH (ref 11.5–15.5)
WBC: 7.1 10*3/uL (ref 4.0–10.5)
nRBC: 0 % (ref 0.0–0.2)

## 2023-08-08 LAB — LIPASE, BLOOD: Lipase: 36 U/L (ref 11–51)

## 2023-08-08 MED ORDER — MORPHINE SULFATE (PF) 4 MG/ML IV SOLN
4.0000 mg | Freq: Once | INTRAVENOUS | Status: AC
  Administered 2023-08-08: 4 mg via INTRAVENOUS
  Filled 2023-08-08: qty 1

## 2023-08-08 MED ORDER — ONDANSETRON HCL 4 MG/2ML IJ SOLN
4.0000 mg | Freq: Once | INTRAMUSCULAR | Status: AC
  Administered 2023-08-08: 4 mg via INTRAVENOUS
  Filled 2023-08-08: qty 2

## 2023-08-08 MED ORDER — IOHEXOL 300 MG/ML  SOLN
100.0000 mL | Freq: Once | INTRAMUSCULAR | Status: AC | PRN
  Administered 2023-08-08: 100 mL via INTRAVENOUS

## 2023-08-08 MED ORDER — DICYCLOMINE HCL 20 MG PO TABS
10.0000 mg | ORAL_TABLET | Freq: Two times a day (BID) | ORAL | 0 refills | Status: DC
  Filled 2023-08-08: qty 5, 5d supply, fill #0

## 2023-08-08 MED ORDER — LIDOCAINE VISCOUS HCL 2 % MT SOLN
15.0000 mL | Freq: Once | OROMUCOSAL | Status: AC
  Administered 2023-08-08: 15 mL via ORAL
  Filled 2023-08-08: qty 15

## 2023-08-08 MED ORDER — SODIUM CHLORIDE 0.9 % IV BOLUS
1000.0000 mL | Freq: Once | INTRAVENOUS | Status: AC
  Administered 2023-08-08: 1000 mL via INTRAVENOUS

## 2023-08-08 MED ORDER — ONDANSETRON HCL 4 MG PO TABS
4.0000 mg | ORAL_TABLET | ORAL | 0 refills | Status: DC | PRN
Start: 2023-08-08 — End: 2023-08-10
  Filled 2023-08-08: qty 12, 2d supply, fill #0

## 2023-08-08 MED ORDER — OXYCODONE HCL 5 MG PO TABS
5.0000 mg | ORAL_TABLET | Freq: Three times a day (TID) | ORAL | 0 refills | Status: DC | PRN
  Filled 2023-08-08: qty 10, 4d supply, fill #0

## 2023-08-08 MED ORDER — ALUM & MAG HYDROXIDE-SIMETH 200-200-20 MG/5ML PO SUSP
30.0000 mL | Freq: Once | ORAL | Status: AC
  Administered 2023-08-08: 30 mL via ORAL
  Filled 2023-08-08: qty 30

## 2023-08-08 NOTE — ED Provider Notes (Addendum)
[place] EMERGENCY DEPARTMENT AT [facility] Provider Note  CSN: [id number] Arrival date & time: 08/07/23 2325  Chief Complaint(s) Abdominal Pain  HPI Marcus [name] is a 83 y.o. male with past medical history as below, significant for GERD, IDA, prostate cancer, CHF, COPD, A-fib on Eliquis who presents to the ED with complaint of abdominal pain, nausea vomiting.  Onset this evening around 6 PM or so.  Was very nauseated, felt bloated.  He vomited a few times and the pain did not improve.  Still nauseated on arrival.  No diarrhea.  Emesis nonbloody nonbilious.  Feels his abdomen is distended.  No fevers or chills last 24 hours.  He has history of pancreas adenocarcinoma, Dr. Truett Perna his oncologist, gemcitabine and abraxane, last treatment was this morning  Past Medical History Past Medical History:  Diagnosis Date   Acute meniscal tear of knee left   Arthritis    generalized   AV block 08/01/2018   Benign essential hypertension 01/18/2007   Bronchiectasis with acute exacerbation 06/23/2020   Chest pain, atypical 08/23/2014   Chronic cough 10/07/2014   Coronary artery disease    Degeneration of lumbar intervertebral disc 10/14/2017   Diverticulosis of colon 07/07/2005   Elevated PSA 06/19/2014   GERD (gastroesophageal reflux disease) 01/18/2007   Gout, unspecified 01/18/2007   Hemorrhoids 01/19/2011   Hiatal hernia    History of colonic polyps 01/18/2007   Insomnia, unspecified 08/21/2007   Iron deficiency anemia, unspecified  01/19/2011   Lumbar post-laminectomy syndrome 10/14/2017   Lumbar spondylolysis    Lumbar strain 12/14/2011   Obstructive sleep apnea 01/18/2007   uses CPAP nightly   Paraesophageal hernia    large   Primary osteoarthritis of knee 05/23/2015   Prostate cancer (HCC) 2016   S/P knee replacement 05/23/2015   Sensorineural hearing loss, bilateral    Vitamin B12 deficiency    Vitamin D deficiency 10/28/2009   Patient Active Problem List    Diagnosis Date Noted   Anemia 02/11/2023   Genetic testing 01/14/2023   AKI (acute kidney injury) (HCC) 01/10/2023   Sepsis secondary from pneumonia and acute cholecystitis  (HCC) 01/09/2023   Pneumonia of left lower lobe due to infectious organism 01/09/2023   CHF exacerbation (HCC) 01/09/2023   Combined systolic and diastolic heart failure (HCC) 01/09/2023   COPD with acute exacerbation (HCC) 01/09/2023   Acute cholecystitis 01/09/2023   Hypokalemia 01/09/2023   History of biliary duct stent placement 01/09/2023   Pruritus 01/09/2023   Pancreatic adenocarcinoma (HCC) 12/29/2022   Malnutrition of moderate degree 12/15/2022   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) 12/10/2022   Pancreatic mass 12/10/2022   Status post bariatric surgery 12/10/2022   Elevated LFTs 12/10/2022   Atrial fibrillation, chronic (HCC) 10/11/2022   Long term (current) use of anticoagulants 10/11/2022   PND (post-nasal drip) 05/03/2022   Bronchiectasis without complication (HCC) 04/03/2022   Vitamin B12 deficiency 12/25/2020   Sensorineural hearing loss, bilateral    Cardiac pacemaker in situ 03/26/2020   AV block 08/01/2018   Degeneration of lumbar intervertebral disc 10/14/2017   Lumbar post-laminectomy syndrome 10/14/2017   Primary osteoarthritis of knee 05/23/2015   S/P knee replacement 05/23/2015   Hemorrhoids 01/19/2011   Vitamin D deficiency 10/28/2009   Obesity 10/17/2007   Gout 01/18/2007   Obstructive sleep apnea 01/18/2007   Benign essential hypertension 01/18/2007   GERD (gastroesophageal reflux disease) 01/18/2007   History of malignant neoplasm of colon 01/18/2007   History of colonic polyps 01/18/2007  Hiatal hernia 10/26/2006   Diverticulosis of colon 07/07/2005   Home Medication(s) Prior to Admission medications   Medication Sig Start Date End Date Taking? Authorizing Provider  dicyclomine (BENTYL) 20 MG tablet Take 0.5 tablets (10 mg total) by mouth 2 (two) times  daily for 5 days. 08/08/23 08/13/23 Yes Tanda Rockers A, DO  ondansetron (ZOFRAN) 4 MG tablet Take 1 tablet (4 mg total) by mouth every 4 (four) hours as needed for nausea or vomiting. 08/08/23  Yes Tanda Rockers A, DO  oxyCODONE (ROXICODONE) 5 MG immediate release tablet Take 1 tablet (5 mg total) by mouth every 8 (eight) hours as needed for severe pain (pain score 7-10). 08/08/23  Yes Sloan Leiter, DO  acetaminophen (TYLENOL) 325 MG tablet Take 2 tablets (650 mg total) by mouth every 6 (six) hours as needed for mild pain (or Fever >/= 101). 01/13/23   Sheikh, Omair Latif, DO  allopurinol (ZYLOPRIM) 300 MG tablet Take 1 tablet (300 mg total) by mouth daily. 05/25/22   Nafziger, Kandee Keen, NP  apixaban (ELIQUIS) 5 MG TABS tablet Take 1 tablet (5 mg total) by mouth 2 (two) times daily. 02/23/23 07/22/23  Nafziger, Kandee Keen, NP  Calcium Carb-Cholecalciferol (CALCIUM 600 + D PO) Take 1 tablet by mouth daily. 800 units of vitamin D    [provider]  docusate sodium (COLACE) 100 MG capsule Take 100 mg by mouth 2 (two) times daily.    [provider]  esomeprazole (NEXIUM) 40 MG capsule Take 1 capsule (40 mg total) by mouth 2 (two) times daily before a meal. Patient not taking: Reported on 07/22/2023 12/24/22     Eszopiclone 3 MG TABS Take 1 tablet (3 mg total) by mouth at bedtime. Take immediately before bedtime Patient not taking: Reported on 07/22/2023 06/14/23   [name], NP  ipratropium (ATROVENT) 0.03 % nasal spray Place 2 sprays into both nostrils every 12 (twelve) hours. 05/03/22     lidocaine-prilocaine (EMLA) cream Apply 1 Application topically as needed. Patient not taking: Reported on 07/22/2023 12/31/22   [name], MD  mirabegron ER (MYRBETRIQ) 50 MG TB24 tablet Take 1 tablet by mouth daily. 06/21/23   [provider]  mirtazapine (REMERON) 15 MG tablet Take 1 tablet (15 mg total) by mouth at bedtime. Patient not taking: Reported on 07/22/2023 06/10/23   [name], MD   Multiple Vitamin (MULTIVITAMIN) tablet Take 1 tablet by mouth in the morning. Patient not taking: Reported on 05/27/2023    [provider]  oxybutynin (DITROPAN) 5 MG tablet Take 2.5 mg by mouth 2 (two) times daily. Patient not taking: Reported on 07/22/2023    [provider]  polyethylene glycol powder (GLYCOLAX/MIRALAX) 17 GM/SCOOP powder Take 17g (1 capful) by mouth as directed daily as needed for mild constipation. Patient not taking: Reported on 04/15/2023 12/15/22   Rolly Salter, MD  potassium chloride (KLOR-CON M) 10 MEQ tablet Take 1 tablet (10 mEq total) by mouth 2 (two) times daily. 03/17/23   [name], MD  Respiratory Therapy Supplies (FLUTTER) DEVI 1 each by Does not apply route in the morning and at bedtime. Patient not taking: Reported on 04/15/2023 10/11/19   Karie Fetch P, DO  sodium chloride HYPERTONIC 3 % nebulizer solution Take by nebulization as needed for other. Patient not taking: Reported on 04/15/2023 04/01/22   [name], NP  tamsulosin (FLOMAX) 0.4 MG CAPS capsule Take 1 capsule by mouth daily. 06/21/23   [provider]  valsartan-hydrochlorothiazide (DIOVAN-HCT) 160-25 MG tablet Take 1 tablet by mouth daily. 04/05/23   [name], NP                                                                                                                                    Past Surgical History Past Surgical History:  Procedure Laterality Date   BILIARY BRUSHING  12/13/2022   Procedure: BILIARY BRUSHING;  Surgeon: Iva Boop, MD;  Location: [facility] ENDOSCOPY;  Service: Gastroenterology;;   BILIARY STENT PLACEMENT  12/13/2022   Procedure: BILIARY STENT PLACEMENT;  Surgeon: Iva Boop, MD;  Location: [facility] ENDOSCOPY;  Service: Gastroenterology;;   BILIARY STENT PLACEMENT N/A 02/07/2023   Procedure: BILIARY STENT PLACEMENT;  Surgeon: Lemar Lofty., MD;  Location: [facility] ENDOSCOPY;  Service: Gastroenterology;  Laterality: N/A;    BIOPSY  12/23/2022   Procedure: BIOPSY;  Surgeon: Lemar Lofty., MD;  Location: [facility] ENDOSCOPY;  Service: Gastroenterology;;   BRONCHIAL WASHINGS  10/30/2019   Procedure: BRONCHIAL WASHINGS;  Surgeon: [name], DO;  Location: [facility] ENDOSCOPY;  Service: Endoscopy;;   CARDIAC CATHETERIZATION     CATARACT EXTRACTION W/ INTRAOCULAR LENS  IMPLANT, BILATERAL Bilateral    COLONOSCOPY     ENDOSCOPIC RETROGRADE CHOLANGIOPANCREATOGRAPHY (ERCP) WITH PROPOFOL N/A 02/07/2023   Procedure: ENDOSCOPIC RETROGRADE CHOLANGIOPANCREATOGRAPHY (ERCP) WITH PROPOFOL;  Surgeon: Lemar Lofty., MD;  Location: [facility] ENDOSCOPY;  Service: Gastroenterology;  Laterality: N/A;  Stent change   ERCP N/A 12/13/2022   Procedure: ENDOSCOPIC RETROGRADE CHOLANGIOPANCREATOGRAPHY (ERCP);  Surgeon: Iva Boop, MD;  Location: [facility] ENDOSCOPY;  Service: Gastroenterology;  Laterality: N/A;  with stent placement   ESOPHAGOGASTRODUODENOSCOPY     ESOPHAGOGASTRODUODENOSCOPY (EGD) WITH PROPOFOL N/A 12/23/2022   Procedure: ESOPHAGOGASTRODUODENOSCOPY (EGD) WITH PROPOFOL;  Surgeon: Meridee Score Netty Starring., MD;  Location: [facility] ENDOSCOPY;  Service: Gastroenterology;  Laterality: N/A;   EUS N/A 12/23/2022   Procedure: UPPER ENDOSCOPIC ULTRASOUND (EUS) RADIAL;  Surgeon: Lemar Lofty., MD;  Location: [facility] ENDOSCOPY;  Service: Gastroenterology;  Laterality: N/A;   FINE NEEDLE ASPIRATION  12/23/2022   Procedure: FINE NEEDLE ASPIRATION (FNA) RADIAL;  Surgeon: Lemar Lofty., MD;  Location: [facility] ENDOSCOPY;  Service: Gastroenterology;;   IR IMAGING GUIDED PORT INSERTION  01/04/2023   KNEE ARTHROSCOPY Right 2007   KNEE ARTHROSCOPY  07/13/2011   Procedure: ARTHROSCOPY KNEE;  Surgeon: Erasmo Leventhal;  Location: [facility];  Service: Orthopedics;  Laterality: Left;  partial menisectomy with chondrylplasty   KNEE SURGERY Left    LAMINECTOMY AND MICRODISCECTOMY LUMBAR SPINE  MARCH  2008   L3 -  4   LAPAROSCOPIC  INCISIONAL / UMBILICAL / VENTRAL HERNIA REPAIR  2006   PACEMAKER IMPLANT N/A 08/02/2018   Procedure: PACEMAKER IMPLANT;  Surgeon: Marinus Maw, MD;  Location: [facility] INVASIVE CV LAB;  Service: Cardiovascular;  Laterality: N/A;   PROSTATE BIOPSY     RADIOACTIVE SEED IMPLANT N/A 02/12/2019   Procedure: RADIOACTIVE  SEED IMPLANT/BRACHYTHERAPY IMPLANT;  Surgeon: Marcine Matar, MD;  Location: [facility] ORS;  Service: Urology;  Laterality: N/A;  90 MINS   REMOVAL OF STONES  02/07/2023   Procedure: REMOVAL OF Sludge;  Surgeon: Meridee Score Netty Starring., MD;  Location: [facility] ENDOSCOPY;  Service: Gastroenterology;;   SHOULDER ARTHROSCOPY Right 05-23-2007   SIGMOID COLECTOMY FOR CANCER  1989   SPACE OAR INSTILLATION N/A 02/12/2019   Procedure: SPACE OAR INSTILLATION;  Surgeon: Marcine Matar, MD;  Location: [facility] ORS;  Service: Urology;  Laterality: N/A;   SPHINCTEROTOMY  12/13/2022   Procedure: SPHINCTEROTOMY;  Surgeon: Iva Boop, MD;  Location: [facility] ENDOSCOPY;  Service: Gastroenterology;;   Francine Graven REMOVAL  02/07/2023   Procedure: STENT REMOVAL;  Surgeon: Lemar Lofty., MD;  Location: [facility] ENDOSCOPY;  Service: Gastroenterology;;   TOTAL KNEE ARTHROPLASTY Left 05/23/2015   Procedure: LEFT TOTAL KNEE ARTHROPLASTY;  Surgeon: Eugenia Mcalpine, MD;  Location: [facility] ORS;  Service: Orthopedics;  Laterality: Left;   VERTICAL BANDED GASTROPLASTY  1986   VIDEO BRONCHOSCOPY N/A 10/30/2019   Procedure: VIDEO BRONCHOSCOPY WITH BRONICAL ALVEROLAR LAVAGE WITHOUT FLUORO;  Surgeon: [name], DO;  Location: [facility] ENDOSCOPY;  Service: Endoscopy;  Laterality: N/A;   Family History Family History  Problem Relation Age of Onset   Stroke Paternal Grandfather    Heart disease Paternal Grandfather    Esophagitis Father        died from perforated esophagus   Breast cancer Mother    Colon cancer Maternal Grandmother        questionable   Esophageal cancer Neg Hx    Prostate cancer Neg Hx    Stomach cancer Neg Hx      Social History Social History   Tobacco Use   Smoking status: Former    Current packs/day: 0.00    Average packs/day: 2.0 packs/day for 20.0 years (40.0 ttl pk-yrs)    Types: Cigarettes    Start date: 07/05/1954    Quit date: 07/05/1974    Years since quitting: 49.1   Smokeless tobacco: Never  Vaping Use   Vaping status: Never Used  Substance Use Topics   Alcohol use: Not Currently   Drug use: No   Allergies Patient has no known allergies.  Review of Systems Review of Systems  Constitutional:  Negative for chills, fatigue and fever.  Respiratory:  Negative for shortness of breath.   Cardiovascular:  Negative for chest pain.  Gastrointestinal:  Positive for abdominal distention, abdominal pain, nausea and vomiting.  Genitourinary:  Negative for dysuria and urgency.  Musculoskeletal:  Negative for arthralgias.  Neurological:  Negative for syncope and light-headedness.  All other systems reviewed and are negative.   Physical Exam Vital Signs  I have reviewed the triage vital signs BP (!) 146/77 (BP Location: Left Arm)   Pulse 77   Temp 98.4 F (36.9 C) (Oral)   Resp 20   SpO2 95%  Physical Exam Vitals and nursing note reviewed.  Constitutional:      General: He is not in acute distress.    Appearance: He is well-developed. He is obese. He is not ill-appearing.  HENT:     Head: Normocephalic and atraumatic.     Right Ear: External ear normal.     Left Ear: External ear normal.     Mouth/Throat:     Mouth: Mucous membranes are moist.  Eyes:     General: No scleral icterus. Cardiovascular:     Rate and Rhythm: Normal rate and regular rhythm.  Pulses: Normal pulses.     Heart sounds: Normal heart sounds.  Pulmonary:     Effort: Pulmonary effort is normal. No respiratory distress.     Breath sounds: Normal breath sounds.  Abdominal:     General: Abdomen is flat. There is distension.     Palpations: Abdomen is soft.     Tenderness: There is abdominal  tenderness in the left lower quadrant.       Comments: Nonperitoneal  Musculoskeletal:     Cervical back: No rigidity.     Right lower leg: No edema.     Left lower leg: No edema.  Skin:    General: Skin is warm and dry.     Capillary Refill: Capillary refill takes less than 2 seconds.     Coloration: Skin is not jaundiced.  Neurological:     Mental Status: He is alert.  Psychiatric:        Mood and Affect: Mood normal.        Behavior: Behavior normal.     ED Results and Treatments Labs (all labs ordered are listed, but only abnormal results are displayed) Labs Reviewed  COMPREHENSIVE METABOLIC PANEL - Abnormal; Notable for the following components:      Result Value   Glucose, Bld 111 (*)    BUN 29 (*)    All other components within normal limits  CBC - Abnormal; Notable for the following components:   RBC 3.86 (*)    Hemoglobin 10.8 (*)    HCT 34.0 (*)    RDW 17.5 (*)    All other components within normal limits  URINALYSIS, ROUTINE W REFLEX MICROSCOPIC - Abnormal; Notable for the following components:   Specific Gravity, Urine >1.046 (*)    Protein, ur TRACE (*)    All other components within normal limits  RESP PANEL BY RT-PCR (RSV, FLU A&B, COVID)  RVPGX2  LIPASE, BLOOD                                                                                                                          Radiology CT ABDOMEN PELVIS W CONTRAST Result Date: 08/08/2023 CLINICAL DATA:  Left lower quadrant abdominal pain vomiting pancreatic cancer. * Tracking Code: BO * EXAM: CT ABDOMEN AND PELVIS WITH CONTRAST TECHNIQUE: Multidetector CT imaging of the abdomen and pelvis was performed using the standard protocol following bolus administration of intravenous contrast. RADIATION DOSE REDUCTION: This exam was performed according to the departmental dose-optimization program which includes automated exposure control, adjustment of the mA and/or kV according to patient size and/or use of  iterative reconstruction technique. CONTRAST:  OMNIPAQUE IOHEXOL 300 MG/ML  SOLN COMPARISON:  07/01/2023 FINDINGS: Lower chest: No acute abnormality. Hepatobiliary: Scattered cysts are again seen throughout the liver. No enhancing intrahepatic mass. Pneumobilia again identified within the nondependent left lobe suggesting patency the indwelling palliative metallic extrahepatic biliary stent. Cholelithiasis again noted. Punctate foci of non dependent gas within the gallbladder lumen suggests patency of  the cystic duct. Mild pericholecystic inflammatory stranding is again identified, nonspecific. Pancreas: The hypodense lesion within the pancreatic head neck junction is again identified measuring roughly 1.9 x 2.3 cm at axial image # 34/2. The main pancreatic duct is dilated distal to this lesion and there is atrophy of the pancreatic parenchyma again noted. Infiltrative changes surrounding the head of the pancreas in this region appear stable since prior examination. No loculated peripancreatic fluid collections are identified. Progressive narrowing of the main portal vein adjacent to the lesion which now demonstrates near occlusion. Spleen: Unremarkable Adrenals/Urinary Tract: The adrenal glands are. Simple cysts are seen within kidney for which no follow-up imaging recommended. Kidneys are otherwise unremarkable. Bladder. Stomach/Bowel: Surgical changes of partial gastrectomy are again identified. The stomach, small bowel large. Appendix. No free intraperitoneal gas or fluid. Vascular/Lymphatic: As noted above, near occlusion the proximal main portal vein just beyond portosplenic. Extensive aortoiliac atherosclerotic calcification. No aortic aneurysm. Shotty peripancreatic is again identified appears stable. Reproductive: Brachytherapy seeds are seen within the prostate gland. Other: No abdominal wall hernia. Musculoskeletal: Degenerative changes are seen within the lumbar spine. No acute bone. Lytic blastic  bone lesion. IMPRESSION: 1. Stable appearance of the hypodense lesion within the pancreatic head neck junction consistent with known pancreatic adenocarcinoma. Progressive narrowing of the main portal vein adjacent to the lesion which now demonstrates near occlusion. 2. Cholelithiasis. Mild pericholecystic inflammatory stranding again identified, nonspecific. If there is clinical concern for acute cholecystitis, further evaluation with right upper quadrant sonography could be considered. 3. Extensive pneumobilia, suggesting patency of the indwelling palliative metallic extrahepatic biliary stent. Aortic Atherosclerosis (ICD10-I70.0).  The Electronically Signed   By: Helyn Numbers M.D.   On: 08/08/2023 01:59    Pertinent labs & imaging results that were available during my care of the patient were reviewed by me and considered in my medical decision making (see MDM for details).  Medications Ordered in ED Medications  morphine (PF) 4 MG/ML injection 4 mg (4 mg Intravenous Given 08/08/23 0059)  ondansetron (ZOFRAN) injection 4 mg (4 mg Intravenous Given 08/08/23 0053)  sodium chloride 0.9 % bolus 1,000 mL (0 mLs Intravenous Stopped 08/08/23 0233)  iohexol (OMNIPAQUE) 300 MG/ML solution 100 mL (100 mLs Intravenous Contrast Given 08/08/23 0139)  morphine (PF) 4 MG/ML injection 4 mg (4 mg Intravenous Given 08/08/23 0315)  alum & mag hydroxide-simeth (MAALOX/MYLANTA) 200-200-20 MG/5ML suspension 30 mL (30 mLs Oral Given 08/08/23 0330)    And  lidocaine (XYLOCAINE) 2 % viscous mouth solution 15 mL (15 mLs Oral Given 08/08/23 0330)  ondansetron (ZOFRAN) injection 4 mg (4 mg Intravenous Given 08/08/23 1610)                                                                                                                                     Procedures Procedures  (including critical care time)  Medical Decision Making / ED Course    Medical Decision  Making:    TYSHAUN VINZANT is a 83 y.o. male with past medical  history as below, significant for GERD, IDA, prostate cancer, CHF, COPD, A-fib on Eliquis who presents to the ED with complaint of abdominal pain, nausea vomiting.. The complaint involves an extensive differential diagnosis and also carries with it a high risk of complications and morbidity.  Serious etiology was considered. Ddx includes but is not limited to: Differential diagnosis includes but is not exclusive to acute appendicitis, renal colic, testicular torsion, urinary tract infection, prostatitis,  diverticulitis, small bowel obstruction, colitis, abdominal aortic aneurysm, gastroenteritis, constipation etc.   Complete initial physical exam performed, notably the patient was in no acute distress, abdomen is nonperitoneal.    Reviewed and confirmed nursing documentation for past medical history, family history, social history.  Vital signs reviewed.      Clinical Course as of 08/08/23 0615  Mon Aug 08, 2023  0138 Hemoglobin(!): 10.8 Similar to prior [SG]  0138 BUN(!): 29 Mildly increased from prior [SG]  0258 CT imaging stable, chronic changes noted. Cholelithiasis noted roughly unchanged, he has no sig RUQ pain, I clinically doubt cholecystitis  [SG]  0424 Labs and imaging are stable [SG]  0539 On recheck feeling much better. Able to tolerate PO intake. Observation was offered but he prefers to go home. Abd pain return precautions provided [SG]    Clinical Course User Index [SG] Sloan Leiter, DO    Brief summary: 83 year old male history as above including A-fib, GERD, pancreatic carcinoma currently on chemotherapy here with left lower quadrant abdominal pain, nausea and vomiting.  Symptoms improved with antiemetic and analgesic.  Labs reviewed and are stable.  CT imaging ordered.  He had MRI of his abdomen 08/05/2023 which showed infiltrative mass in the pancreatic head encasing the SMV and contacting SMA.  Patent common bile duct stent, gallbladder wall thickening with gallstones  which is unchanged from prior  Workup today reassuring, he is feeling much better.  See ED course above.  Observation was offered given his age, comorbidities and unclear explanation for his abdominal pain.  He has elected to go home.  He will follow-up with PCP.  The patient's overall condition has improved, the patient presents with abdominal pain without signs of peritonitis, or other life-threatening serious etiology. The patient understands that at this time there is no evidence for a more malignant underlying process, but the patient also understands that early in the process of an illness, an emergency department workup can be falsely reassuring. Detailed discussions were had with the patient regarding current findings, and need for close f/u with PCP or on call doctor. The patient appears stable for discharge and has been instructed to return immediately if the symptoms worsen in any way, or in 24hr if not improved for re-evaluation. Patient verbalized understanding and is in agreement with current care plan.  All questions answered prior to discharge.           Additional history obtained: -Additional history obtained from family -External records from outside source obtained and reviewed including: Chart review including previous notes, labs, imaging, consultation notes including  Medications, oncology documentation, prior labs and imaging   Lab Tests: -I ordered, reviewed, and interpreted labs.   The pertinent results include:   Labs Reviewed  COMPREHENSIVE METABOLIC PANEL - Abnormal; Notable for the following components:      Result Value   Glucose, Bld 111 (*)    BUN 29 (*)    All other components within  normal limits  CBC - Abnormal; Notable for the following components:   RBC 3.86 (*)    Hemoglobin 10.8 (*)    HCT 34.0 (*)    RDW 17.5 (*)    All other components within normal limits  URINALYSIS, ROUTINE W REFLEX MICROSCOPIC - Abnormal; Notable for the following  components:   Specific Gravity, Urine >1.046 (*)    Protein, ur TRACE (*)    All other components within normal limits  RESP PANEL BY RT-PCR (RSV, FLU A&B, COVID)  RVPGX2  LIPASE, BLOOD    Notable for labs stable  EKG   EKG Interpretation Date/Time:    Ventricular Rate:    PR Interval:    QRS Duration:    QT Interval:    QTC Calculation:   R Axis:      Text Interpretation:           Imaging Studies ordered: I ordered imaging studies including CTAP I independently visualized the following imaging with scope of interpretation limited to determining acute life threatening conditions related to emergency care; findings noted above I independently visualized and interpreted imaging. I agree with the radiologist interpretation   Medicines ordered and prescription drug management: Meds ordered this encounter  Medications   morphine (PF) 4 MG/ML injection 4 mg   ondansetron (ZOFRAN) injection 4 mg   sodium chloride 0.9 % bolus 1,000 mL   iohexol (OMNIPAQUE) 300 MG/ML solution 100 mL   morphine (PF) 4 MG/ML injection 4 mg   AND Linked Order Group    alum & mag hydroxide-simeth (MAALOX/MYLANTA) 200-200-20 MG/5ML suspension 30 mL    lidocaine (XYLOCAINE) 2 % viscous mouth solution 15 mL   ondansetron (ZOFRAN) injection 4 mg   oxyCODONE (ROXICODONE) 5 MG immediate release tablet    Sig: Take 1 tablet (5 mg total) by mouth every 8 (eight) hours as needed for severe pain (pain score 7-10).    Dispense:  10 tablet    Refill:  0   ondansetron (ZOFRAN) 4 MG tablet    Sig: Take 1 tablet (4 mg total) by mouth every 4 (four) hours as needed for nausea or vomiting.    Dispense:  12 tablet    Refill:  0   dicyclomine (BENTYL) 20 MG tablet    Sig: Take 0.5 tablets (10 mg total) by mouth 2 (two) times daily for 5 days.    Dispense:  5 tablet    Refill:  0    -I have reviewed the patients home medicines and have made adjustments as needed   Consultations Obtained: na   Cardiac  Monitoring: The patient was maintained on a cardiac monitor.  I personally viewed and interpreted the cardiac monitored which showed an underlying rhythm of: NSR Continuous pulse oximetry interpreted by myself, 99% on RA.    Social Determinants of Health:  Diagnosis or treatment significantly limited by social determinants of health: former smoker   Reevaluation: After the interventions noted above, I reevaluated the patient and found that they have improved  Co morbidities that complicate the patient evaluation  Past Medical History:  Diagnosis Date   Acute meniscal tear of knee left   Arthritis    generalized   AV block 08/01/2018   Benign essential hypertension 01/18/2007   Bronchiectasis with acute exacerbation 06/23/2020   Chest pain, atypical 08/23/2014   Chronic cough 10/07/2014   Coronary artery disease    Degeneration of lumbar intervertebral disc 10/14/2017   Diverticulosis of colon 07/07/2005   Elevated  PSA 06/19/2014   GERD (gastroesophageal reflux disease) 01/18/2007   Gout, unspecified 01/18/2007   Hemorrhoids 01/19/2011   Hiatal hernia    History of colonic polyps 01/18/2007   Insomnia, unspecified 08/21/2007   Iron deficiency anemia, unspecified  01/19/2011   Lumbar post-laminectomy syndrome 10/14/2017   Lumbar spondylolysis    Lumbar strain 12/14/2011   Obstructive sleep apnea 01/18/2007   uses CPAP nightly   Paraesophageal hernia    large   Primary osteoarthritis of knee 05/23/2015   Prostate cancer (HCC) 2016   S/P knee replacement 05/23/2015   Sensorineural hearing loss, bilateral    Vitamin B12 deficiency    Vitamin D deficiency 10/28/2009      Dispostion: Disposition decision including need for hospitalization was considered, and patient discharged from emergency department.    Final Clinical Impression(s) / ED Diagnoses Final diagnoses:  Abdominal pain, unspecified abdominal location  Nausea and vomiting, unspecified vomiting type   History of pancreatic cancer        Sloan Leiter, DO 08/08/23 [id number]    Sloan Leiter, DO 08/08/23 [phone]

## 2023-08-08 NOTE — Progress Notes (Signed)
[facility] OFFICE PROGRESS NOTE   Diagnosis: Pancreas cancer  INTERVAL HISTORY:   [name] returns as scheduled.  He remains off of treatment for pancreas cancer.  He underwent an MRI of the liver on 08/05/2023.  He developed acute abdominal pain and nausea/vomiting last night.  He was seen in the emergency room last night.  A CT revealed no acute finding.  He was given Zofran and morphine in the emergency room.  The pain has resolved.  Objective:  Vital signs in last 24 hours:  Blood pressure 126/69, pulse 63, temperature 98.1 F (36.7 C), temperature source Temporal, resp. rate 18, height 5\' 8"  (1.727 m), weight 219 lb (99.3 kg), SpO2 100%.     Lymphatics: No cervical, supraclavicular, axillary, or inguinal nodes Resp: Lungs with scattered coarse end inspiratory rhonchi, no respiratory distress Cardio: Regular rate and rhythm GI: No hepatosplenomegaly, no mass, nontender Vascular: No leg edema  Portacath/PICC-without erythema  Lab Results:  Lab Results  Component Value Date   WBC 7.1 08/08/2023   HGB 10.8 (L) 08/08/2023   HCT 34.0 (L) 08/08/2023   MCV 88.1 08/08/2023   PLT 159 08/08/2023   NEUTROABS 3.3 07/22/2023    CMP  Lab Results  Component Value Date   NA 143 08/08/2023   K 3.6 08/08/2023   CL 105 08/08/2023   CO2 30 08/08/2023   GLUCOSE 111 (H) 08/08/2023   BUN 29 (H) 08/08/2023   CREATININE 1.13 08/08/2023   CALCIUM 9.3 08/08/2023   PROT 7.2 08/08/2023   ALBUMIN 3.8 08/08/2023   AST 20 08/08/2023   ALT 15 08/08/2023   ALKPHOS 75 08/08/2023   BILITOT 0.4 08/08/2023   GFRNONAA >60 08/08/2023   GFRAA >60 02/07/2019    Lab Results  Component Value Date   CEA1 6.4 (H) 12/11/2022   CEA 1.1 03/03/2010   CAN199 849 (H) 07/08/2023     Imaging:  CT ABDOMEN PELVIS W CONTRAST Result Date: 08/08/2023 CLINICAL DATA:  Left lower quadrant abdominal pain vomiting pancreatic cancer. * Tracking Code: BO * EXAM: CT ABDOMEN AND PELVIS WITH  CONTRAST TECHNIQUE: Multidetector CT imaging of the abdomen and pelvis was performed using the standard protocol following bolus administration of intravenous contrast. RADIATION DOSE REDUCTION: This exam was performed according to the departmental dose-optimization program which includes automated exposure control, adjustment of the mA and/or kV according to patient size and/or use of iterative reconstruction technique. CONTRAST:  OMNIPAQUE IOHEXOL 300 MG/ML  SOLN COMPARISON:  07/01/2023 FINDINGS: Lower chest: No acute abnormality. Hepatobiliary: Scattered cysts are again seen throughout the liver. No enhancing intrahepatic mass. Pneumobilia again identified within the nondependent left lobe suggesting patency the indwelling palliative metallic extrahepatic biliary stent. Cholelithiasis again noted. Punctate foci of non dependent gas within the gallbladder lumen suggests patency of the cystic duct. Mild pericholecystic inflammatory stranding is again identified, nonspecific. Pancreas: The hypodense lesion within the pancreatic head neck junction is again identified measuring roughly 1.9 x 2.3 cm at axial image # 34/2. The main pancreatic duct is dilated distal to this lesion and there is atrophy of the pancreatic parenchyma again noted. Infiltrative changes surrounding the head of the pancreas in this region appear stable since prior examination. No loculated peripancreatic fluid collections are identified. Progressive narrowing of the main portal vein adjacent to the lesion which now demonstrates near occlusion. Spleen: Unremarkable Adrenals/Urinary Tract: The adrenal glands are. Simple cysts are seen within kidney for which no follow-up imaging recommended. Kidneys are otherwise unremarkable. Bladder. Stomach/Bowel:  Surgical changes of partial gastrectomy are again identified. The stomach, small bowel large. Appendix. No free intraperitoneal gas or fluid. Vascular/Lymphatic: As noted above, near occlusion  the proximal main portal vein just beyond portosplenic. Extensive aortoiliac atherosclerotic calcification. No aortic aneurysm. Shotty peripancreatic is again identified appears stable. Reproductive: Brachytherapy seeds are seen within the prostate gland. Other: No abdominal wall hernia. Musculoskeletal: Degenerative changes are seen within the lumbar spine. No acute bone. Lytic blastic bone lesion. IMPRESSION: 1. Stable appearance of the hypodense lesion within the pancreatic head neck junction consistent with known pancreatic adenocarcinoma. Progressive narrowing of the main portal vein adjacent to the lesion which now demonstrates near occlusion. 2. Cholelithiasis. Mild pericholecystic inflammatory stranding again identified, nonspecific. If there is clinical concern for acute cholecystitis, further evaluation with right upper quadrant sonography could be considered. 3. Extensive pneumobilia, suggesting patency of the indwelling palliative metallic extrahepatic biliary stent. Aortic Atherosclerosis (ICD10-I70.0).  The Electronically Signed   By: Helyn Numbers M.D.   On: 08/08/2023 01:59   Marcus Abdomen W Wo Contrast Result Date: 08/06/2023 CLINICAL DATA:  Pancreatic cancer assess treatment response, characterize liver lesions as cysts versus metastases EXAM: MRI ABDOMEN WITHOUT AND WITH CONTRAST TECHNIQUE: Multiplanar multisequence Marcus imaging of the abdomen was performed both before and after the administration of intravenous contrast. CONTRAST:  10mL GADAVIST GADOBUTROL 1 MMOL/ML IV SOLN COMPARISON:  CT abdomen pelvis, 07/01/2023 FINDINGS: Lower chest: No acute abnormality. Hepatobiliary: No solid liver abnormality is seen. Multiple fluid signal cysts scattered throughout the liver, previously identified by prior CT. No solid lesion or suspicious contrast enhancement. Unchanged gallbladder wall thickening. Sludge and small gallstones in the gallbladder as well as a small air-fluid level. Common bile duct stent,  which appears patent, with unchanged mild intrahepatic biliary ductal dilatation and post stenting pneumobilia. Pancreas: Unchanged appearance of the pancreas with an ill-defined, infiltrative mass of the pancreatic head encasing the adjacent central superior mesenteric vein and contacting the superior mesenteric artery (series 1004, image 66). Although difficult to accurately marginate and measure this is approximately 5.6 x 3.3 cm (series 1004, image 87). Unchanged diffuse atrophy and cystic change of the distal pancreas, with distention of the pancreatic duct up to 1.0 cm (series 12, image 22). Spleen: Mild splenomegaly, maximum coronal span 14.2 cm. Adrenals/Urinary Tract: Adrenal glands are unremarkable. Simple, benign bilateral renal cortical and parapelvic cysts, for which no further follow-up or characterization is required. Kidneys are otherwise normal, without obvious renal calculi, solid lesion, or hydronephrosis. Stomach/Bowel: Stomach is within normal limits. No evidence of bowel wall thickening, distention, or inflammatory changes. Vascular/Lymphatic: Aortic atherosclerosis. No enlarged abdominal lymph nodes. Other: No abdominal wall hernia or abnormality. No ascites. Musculoskeletal: No acute or significant osseous findings. IMPRESSION: 1. Unchanged appearance of the pancreas with an ill-defined, infiltrative mass of the pancreatic head encasing the adjacent central superior mesenteric vein and contacting the superior mesenteric artery. 2. Unchanged diffuse atrophy and cystic change of the distal pancreas, with distention of the pancreatic duct up to 1.0 cm. 3. Common bile duct stent, which appears patent, with unchanged mild intrahepatic biliary ductal dilatation and post stenting pneumobilia. 4. Multiple fluid signal cysts scattered throughout the liver, previously identified by prior CT. No solid lesion or suspicious contrast enhancement. No evidence of lymphadenopathy or distant metastatic disease  in the abdomen. 5. Unchanged gallbladder wall thickening. Sludge and small gallstones in the gallbladder as well as a small air-fluid level. Aortic Atherosclerosis (ICD10-I70.0). Electronically Signed   By: Bonna Gains.D.  On: 08/06/2023 08:05    Medications: I have reviewed the patient's current medications.   Assessment/Plan: Pancreas cancer CT abdomen/pelvis 12/03/2022-suspected pancreas mass in the proximal body measuring 2 cm with probable mass effect on the superior aspect of the SMV, multiple hypodense liver lesions, many are new from a remote CT-indeterminate, 2 cystic lesions in the pancreas tail-nonspecific prior bariatric surgery ERCP 12/13/2022-malignant appearing common bile duct stricture, plastic stent placed, brushing cytology-no malignant cells EUS 12/23/2022-30 x 33 mm pancreas head mass with invasion of the SMV, no malignant appearing lymph nodes, 4 cysts in the visualized liver, FNA-adenocarcinoma Cycle 1 gemcitabine and abraxane 01/07/2023 CTs 01/09/2023-multiple low-attenuation liver lesions unchanged, largest of which are clearly benign fluid attenuation cyst, others more ill-defined and incompletely characterized suspicious for small metastases.  Similar appearance of hypodense mass in the pancreatic head.  Distended gallbladder with mild gallbladder wall thickening and adjacent pericholecystic fat stranding.  Sludge in the gallbladder fundus.  Common bile duct stent in place with diminished, persistent intrahepatic biliary ductal dilatation.  Negative for pulmonary embolism.  Acute appearing heterogeneous airspace opacity of the superior segment left lower lobe consistent with infection or aspiration. Cycle 2 gemcitabine and abraxane 01/21/2023, gemcitabine dose reduced due to neutropenia following cycle 1 Cycle 3 gemcitabine/Abraxane 02/04/2023 Cycle 4 gemcitabine/Abraxane 02/17/2023 Cycle 5 gemcitabine/Abraxane 03/04/2023 Cycle 6 gemcitabine/Abraxane 03/17/2023 CT abdomen/pelvis  03/28/2023-stable pancreas mass, multiple small hypoattenuating liver lesions (review of CTs at the GI tumor conference is most consistent with liver metastases-some smaller compared to the baseline CT) Cycle 7 gemcitabine/Abraxane 04/01/2023 Cycle 8 gemcitabine/Abraxane 04/15/2023 Cycle 9 gemcitabine/Abraxane 04/29/2023 Treatment held per patient request 05/12/2023 Cycle 10 gemcitabine/Abraxane 05/27/2023 Cycle 11 gemcitabine/Abraxane 06/10/2023 Cycle 12 gemcitabine/Abraxane 06/24/2023 CTs 07/01/2023-no significant difference in size of the mass in the pancreatic head.  Redemonstration of multiple hypoattenuating liver lesions essentially unchanged and favored to represent simple cysts. 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, cholelithiasis and mild pericholecystic inflammatory stranding Biliary obstruction secondary to 1-status post ERCP/stent placement 12/13/2022 Coronary artery disease Atrial fibrillation Gout Sleep apnea Status post bariatric surgery Hypertension Bronchiectasis Gastroesophageal reflux disease History of colon cancer in his 40s Complete heart block-pacemaker placed Admission 01/09/2023 with a fever and failure to thrive, CT chest with evidence of left lung pneumonia, CT abdomen/pelvis with a distended gallbladder and adjacent fat stranding Anemia 08/07/2023: Acute onset nausea/vomiting and abdominal pain-resolved after Zofran/morphine  in the emergency room, CT without acute findings  Disposition: Marcus [name] was diagnosed with pancreas cancer in July 2024.  He was initially felt to have metastatic disease involving the liver.  He was treated with gemcitabine/Abraxane, last given 06/24/2023.  Recent imaging reveals no evidence of disease progression.  An MRI of the liver reveals multiple cysts in the liver and no evidence of metastatic disease.  Marcus [name] appears to have locally advanced pancreas cancer.  He is not a surgical candidate based on his age, comorbid conditions, and vascular involvement.  We discussed observation, continuing systemic chemotherapy, and radiation.  He would like to consider radiation.  We discussed SBRT and standard radiation.  I will make referral to Dr. Mitzi Hansen.  We we will add capecitabine if Dr. Mitzi Hansen recommends a standard course of radiation.  The etiology of the acute abdominal pain last night is unclear.  I have a low clinical suspicion for cholecystitis or tumor related pain since he appears well today.  He will return for an office visit and Port-A-Cath flush in 1 month.  We will see him sooner if he is scheduled for standard pancreas radiation.  Thornton Papas, MD  08/08/2023  2:07 PM

## 2023-08-08 NOTE — ED Triage Notes (Signed)
Pt c/oLLQ pain, distended and tender on palpation. Onset suddenly at 6pm.Passed gas then vomited about10. Continues to have severe pain. Due for chemo for pancreatic cancer in the morning

## 2023-08-08 NOTE — Discharge Instructions (Addendum)
The etiology of your abdominal pain is unclear.    There are many causes of abdominal pain. Most pain is not serious and goes away, but some pain gets worse, changes, or will not go away. Please return to the emergency department or see your doctor right away if you (or your family member) experience any of the following:  1. Pain that gets worse or moves to just one spot.  2. Pain that gets worse if you cough or sneeze.  3. Pain with going over a bump in the road.  4. Pain that does not get better in 24 hours.  5. Inability to keep down liquids (vomiting)-especially if you are making less urine.  6. Fainting.  7. Blood in the vomit or stool.  8. High fever or shaking chills.  9. Swelling of the abdomen.  10. Any new or worsening problem.    Follow up instructions: Please return for recheck if your symptoms do not improve over the next 24 hours or if they get worse.   Additional Instructions: No alcohol.  No caffeine, aspirin, or cigarettes.     Please return to the emergency department immediately for any new or concerning symptoms, or if you get worse.

## 2023-08-09 ENCOUNTER — Ambulatory Visit: Payer: Medicare Other | Admitting: Nurse Practitioner

## 2023-08-09 ENCOUNTER — Telehealth: Payer: Self-pay

## 2023-08-09 ENCOUNTER — Other Ambulatory Visit (HOSPITAL_BASED_OUTPATIENT_CLINIC_OR_DEPARTMENT_OTHER): Payer: Self-pay

## 2023-08-09 MED ORDER — ALLOPURINOL 300 MG PO TABS
300.0000 mg | ORAL_TABLET | Freq: Every day | ORAL | 3 refills | Status: DC
  Filled 2023-08-09: qty 90, 90d supply, fill #0

## 2023-08-09 NOTE — Transitions of Care (Post Inpatient/ED Visit) (Signed)
 08/09/2023  Name: Marcus [name] MRN: [id number] DOB: 1940-12-02  Today's TOC FU Call Status: Today's TOC FU Call Status:: Successful TOC FU Call Completed TOC FU Call Complete Date: 08/09/23 Patient's Name and Date of Birth confirmed.  Transition Care Management Follow-up Telephone Call Date of Discharge: 08/08/23 Discharge Facility: [facility] ([facility]) Type of Discharge: Emergency Department Reason for ED Visit: Other: (abdominal pain, n&v) How have you been since you were released from the hospital?: Same Any questions or concerns?: Yes Patient Questions/Concerns Addressed: Other: (patient wouldn't elaborate. wants to discuss with C Nafziger)  Items Reviewed: Did you receive and understand the discharge instructions provided?: Yes Medications obtained,verified, and reconciled?: Yes (Medications Reviewed) Any new allergies since your discharge?: No Dietary orders reviewed?: NA Do you have support at home?: Yes  Medications Reviewed Today: Medications Reviewed Today     Reviewed by Yishai Rehfeld, [place] LABOR, CMA (Certified Medical Assistant) on 08/09/23 at 1546  Med List Status: <None>   Medication Order Taking? Sig Documenting Provider Last Dose Status Informant  acetaminophen  (TYLENOL ) 325 MG tablet [id number] No Take 2 tablets (650 mg total) by mouth every 6 (six) hours as needed for mild pain (or Fever >/= 101). Sherrill Cable [place], DO Taking Active            Med Note (MCLAUGHLIN, CONSTANCE S   Wed Feb 09, 2023  1:24 PM) As needed  allopurinol  (ZYLOPRIM ) 300 MG tablet [id number]  Take 1 tablet (300 mg total) by mouth daily. Nafziger, Darleene, NP  Active   apixaban  (ELIQUIS ) 5 MG TABS tablet [id number] No Take 1 tablet (5 mg total) by mouth 2 (two) times daily. Nafziger, Darleene, NP Taking Active   Calcium  Carb-Cholecalciferol  (CALCIUM  600 + D PO) [id number] No Take 1 tablet by mouth daily. 800 units of vitamin D  [provider] Taking Active Self  cyanocobalamin  (VITAMIN B12)  500 MCG tablet [id number] No Take 1 tablet by mouth 2 (two) times daily.  Patient not taking: Reported on 08/08/2023   [provider] Not Taking Active   dicyclomine  (BENTYL ) 20 MG tablet [id number] No Take 0.5 tablets (10 mg total) by mouth 2 (two) times daily for 5 days.  Patient not taking: Reported on 08/08/2023   Elnor Jayson LABOR, DO Not Taking Active   docusate sodium  (COLACE) 100 MG capsule [id number] No Take 100 mg by mouth 2 (two) times daily. [provider] Taking Active   esomeprazole  (NEXIUM ) 40 MG capsule [id number] No Take 1 capsule (40 mg total) by mouth 2 (two) times daily before a meal.  Patient not taking: Reported on 08/08/2023    Not Taking Active   Eszopiclone  3 MG TABS [id number] No Take 1 tablet (3 mg total) by mouth at bedtime. Take immediately before bedtime Nafziger, Darleene, NP Taking Active   ipratropium (ATROVENT ) 0.03 % nasal spray [id number] No Place 2 sprays into both nostrils every 12 (twelve) hours.  Patient not taking: Reported on 08/08/2023    Not Taking Active   lidocaine -prilocaine  (EMLA ) cream [id number] No Apply 1 Application topically as needed.  Patient not taking: Reported on 07/22/2023   Cloretta Arley NOVAK, MD Not Taking Active Self           Med Note [facility], CONSTANCE S   Wed Feb 09, 2023  1:24 PM) As needed  mirabegron  ER (MYRBETRIQ ) 50 MG TB24 tablet [id number] No Take 1 tablet by mouth daily. [provider] Taking Active   mirtazapine  (REMERON ) 15 MG tablet [id number] No Take 1 tablet (  15 mg total) by mouth at bedtime.  Patient not taking: Reported on 08/08/2023   Cloretta Arley NOVAK, MD Not Taking Active   Multiple Vitamin (MULTIVITAMIN) tablet [id number] No Take 1 tablet by mouth in the morning.  Patient not taking: Reported on 05/27/2023   [provider] Not Taking Active Self  ondansetron  (ZOFRAN ) 4 MG tablet [id number] No Take 1 tablet (4 mg total) by mouth every 4 (four) hours as needed for nausea or vomiting.  Patient not  taking: Reported on 08/08/2023   Elnor Jayson LABOR, DO Not Taking Active   oxybutynin  (DITROPAN ) 5 MG tablet [id number] No Take 2.5 mg by mouth 2 (two) times daily.  Patient not taking: Reported on 04/29/2023   [provider] Not Taking Active   oxyCODONE  (ROXICODONE ) 5 MG immediate release tablet [id number] No Take 1 tablet (5 mg total) by mouth every 8 (eight) hours as needed for severe pain (pain score 7-10). Elnor Jayson LABOR, DO Taking Active   polyethylene glycol powder (GLYCOLAX /MIRALAX ) 17 GM/SCOOP powder [id number] No Take 17g (1 capful) by mouth as directed daily as needed for mild constipation.  Patient not taking: Reported on 04/15/2023   Tobie Yetta HERO, MD Not Taking Active Self           Med Note [facility], CONSTANCE S   Wed Feb 09, 2023  1:25 PM) As needed  potassium chloride  (KLOR-CON  M) 10 MEQ tablet [id number] No Take 1 tablet (10 mEq total) by mouth 2 (two) times daily. Cloretta Arley NOVAK, MD Taking Active            Med Note MERNA, DEVERE CROME   Fri Apr 15, 2023  9:03 AM) Only takes 10 meq daily  Respiratory Therapy Supplies (FLUTTER) DEVI [id number] No 1 each by Does not apply route in the morning and at bedtime.  Patient not taking: Reported on 04/15/2023   Gretta Leita SQUIBB, DO Not Taking Active Self           Med Note [facility], CONSTANCE S   Wed Feb 09, 2023  1:25 PM) As needed  sodium chloride  HYPERTONIC 3 % nebulizer solution [id number] No Take by nebulization as needed for other.  Patient not taking: Reported on 04/15/2023   Hope Almarie ORN, NP Not Taking Active Self           Med Note [facility], CONSTANCE S   Wed Feb 09, 2023  1:24 PM) As needed  tamsulosin  (FLOMAX ) 0.4 MG CAPS capsule [id number] No Take 1 capsule by mouth daily.  Patient not taking: Reported on 08/08/2023   [provider] Not Taking Active   traZODone  (DESYREL ) 50 MG tablet [id number] No Take 1 tablet by mouth at bedtime as needed.  Patient not taking: Reported on 08/08/2023   [provider] Not Taking Active   valsartan -hydrochlorothiazide  (DIOVAN -HCT) 160-25 MG tablet [id number] No Take 1 tablet by mouth daily. Nafziger, Darleene, NP Taking Active             Home Care and Equipment/Supplies: Were Home Health Services Ordered?: NA Any new equipment or medical supplies ordered?: NA  Functional Questionnaire: Do you need assistance with bathing/showering or dressing?: No Do you need assistance with meal preparation?: No Do you need assistance with eating?: No Do you have difficulty maintaining continence: No Do you need assistance with getting out of bed/getting out of a chair/moving?: No Do you have difficulty managing or taking your medications?: No  Follow up appointments reviewed: PCP Follow-up appointment confirmed?: Yes  Date of PCP follow-up appointment?: 08/10/23 Follow-up Provider: [facility] Follow-up appointment confirmed?: NA Do you need transportation to your follow-up appointment?: No Do you understand care options if your condition(s) worsen?: Yes-patient verbalized understanding    Lai Hendriks, CMA  [facility] AWV Team Direct Dial: [phone]

## 2023-08-10 ENCOUNTER — Encounter: Payer: Self-pay | Admitting: *Deleted

## 2023-08-10 ENCOUNTER — Other Ambulatory Visit (HOSPITAL_BASED_OUTPATIENT_CLINIC_OR_DEPARTMENT_OTHER): Payer: Self-pay

## 2023-08-10 ENCOUNTER — Other Ambulatory Visit: Payer: Self-pay | Admitting: *Deleted

## 2023-08-10 ENCOUNTER — Telehealth: Payer: Self-pay | Admitting: *Deleted

## 2023-08-10 ENCOUNTER — Ambulatory Visit: Payer: No Typology Code available for payment source | Admitting: Adult Health

## 2023-08-10 VITALS — BP 100/70 | HR 66 | Temp 98.1°F

## 2023-08-10 DIAGNOSIS — R110 Nausea: Secondary | ICD-10-CM | POA: Diagnosis not present

## 2023-08-10 DIAGNOSIS — R197 Diarrhea, unspecified: Secondary | ICD-10-CM | POA: Diagnosis not present

## 2023-08-10 DIAGNOSIS — K219 Gastro-esophageal reflux disease without esophagitis: Secondary | ICD-10-CM | POA: Diagnosis not present

## 2023-08-10 DIAGNOSIS — C259 Malignant neoplasm of pancreas, unspecified: Secondary | ICD-10-CM

## 2023-08-10 MED ORDER — ESOMEPRAZOLE MAGNESIUM 40 MG PO CPDR
40.0000 mg | DELAYED_RELEASE_CAPSULE | Freq: Two times a day (BID) | ORAL | 12 refills | Status: DC
  Filled 2023-08-10: qty 60, 30d supply, fill #0

## 2023-08-10 MED ORDER — ONDANSETRON HCL 4 MG PO TABS
4.0000 mg | ORAL_TABLET | ORAL | 0 refills | Status: DC | PRN
  Filled 2023-08-10: qty 12, 2d supply, fill #0

## 2023-08-10 NOTE — Telephone Encounter (Addendum)
 Marcus [name] called to inquire if there is a substitute for Eliquis . He has been informed his refill will be $300 and he is not able to afford this. Notified him that Xarelto is only other drug, but it is costly as well. Warfarin is less expensive, but requires a lot of blood draws to monitor. Also a daily injection that is expensive as well.  Suggested he try to complete application with [name] by calling [phone] or have someone come to office and pick up an application to complete and bring back with his proof of income and medical costs. This would need to be completed by his PCP, since we did not order the drug (A. Fib). He reports he is going to the [name] tomorrow and will see if they can provided the medication. If not, he will pick up the form.

## 2023-08-10 NOTE — Progress Notes (Signed)
Request for services [facility] form [id number] filled out and signed as well as office notes and scan reports sent to [facility] @ [email or web address]

## 2023-08-10 NOTE — Progress Notes (Signed)
 Subjective:    Patient ID: Marcus [name], male    DOB: Jun 26, 1941, 83 y.o.   MRN: [id number]  HPI 83 year old male who  has a past medical history of Acute meniscal tear of knee (left), Arthritis, AV block (08/01/2018), Benign essential hypertension (01/18/2007), Bronchiectasis with acute exacerbation (06/23/2020), Chest pain, atypical (08/23/2014), Chronic cough (10/07/2014), Coronary artery disease, Degeneration of lumbar intervertebral disc (10/14/2017), Diverticulosis of colon (07/07/2005), Elevated PSA (06/19/2014), GERD (gastroesophageal reflux disease) (01/18/2007), Gout, unspecified (01/18/2007), Hemorrhoids (01/19/2011), Hiatal hernia, History of colonic polyps (01/18/2007), Insomnia, unspecified (08/21/2007), Iron deficiency anemia, unspecified  (01/19/2011), Lumbar post-laminectomy syndrome (10/14/2017), Lumbar spondylolysis, Lumbar strain (12/14/2011), Obstructive sleep apnea (01/18/2007), Paraesophageal hernia, Primary osteoarthritis of knee (05/23/2015), Prostate cancer (HCC) (2016), S/P knee replacement (05/23/2015), Sensorineural hearing loss, bilateral, Vitamin B12 deficiency, and Vitamin D  deficiency (10/28/2009).  He was seen in the emergency room 2 days ago with a complaint of abdominal pain, nausea and vomiting.  His pain started around 6 PM that evening when he felt very nauseous and bloated.  He vomited a few times and the pain did not improve with still nauseous on arrival.  At this time he did not have any diarrhea.  Denied fevers or chills.  His last chemo treatment was earlier that morning  Workup in the ER included labs and CT of the abdomen reassuring and he was feeling better so he was discharged home.  Today he reports that starting a couple of days before he went to the emergency room he started to have really bad gas.  When he started having left-sided abdominal pain is when he went to the emergency room.  After he got home from the emergency room he knew he was  constipated so he took 2 extra strength Ex-Lax and continued with his prescribed Colace.  He has had severe diarrhea since coming home from the emergency room and continues to have nausea vomiting.  He last took Colace yesterday evening.  Does not really have any more abdominal pain but does feel distended.  He has prescribed Nexium  that he has not been taking.  He has not noticed any blood in his stool   Review of Systems See HPI   Past Medical History:  Diagnosis Date   Acute meniscal tear of knee left   Arthritis    generalized   AV block 08/01/2018   Benign essential hypertension 01/18/2007   Bronchiectasis with acute exacerbation 06/23/2020   Chest pain, atypical 08/23/2014   Chronic cough 10/07/2014   Coronary artery disease    Degeneration of lumbar intervertebral disc 10/14/2017   Diverticulosis of colon 07/07/2005   Elevated PSA 06/19/2014   GERD (gastroesophageal reflux disease) 01/18/2007   Gout, unspecified 01/18/2007   Hemorrhoids 01/19/2011   Hiatal hernia    History of colonic polyps 01/18/2007   Insomnia, unspecified 08/21/2007   Iron deficiency anemia, unspecified  01/19/2011   Lumbar post-laminectomy syndrome 10/14/2017   Lumbar spondylolysis    Lumbar strain 12/14/2011   Obstructive sleep apnea 01/18/2007   uses CPAP nightly   Paraesophageal hernia    large   Primary osteoarthritis of knee 05/23/2015   Prostate cancer (HCC) 2016   S/P knee replacement 05/23/2015   Sensorineural hearing loss, bilateral    Vitamin B12 deficiency    Vitamin D  deficiency 10/28/2009    Social History   Socioeconomic History   Marital status: Widowed    Spouse name: Not on file   Number of children: 4  Years of education: 17   Highest education level: Some college, no degree  Occupational History   Occupation: retired    Associate Professor: [employer]  Tobacco Use   Smoking status: Former    Current packs/day: 0.00    Average packs/day: 2.0 packs/day for 20.0  years (40.0 ttl pk-yrs)    Types: Cigarettes    Start date: 07/05/1954    Quit date: 07/05/1974    Years since quitting: 49.1   Smokeless tobacco: Never  Vaping Use   Vaping status: Never Used  Substance and Sexual Activity   Alcohol use: Not Currently   Drug use: No   Sexual activity: Not Currently  Other Topics Concern   Not on file  Social History Narrative   Recently widowed   Former Smoker    Alcohol use-yes 1-2 drinks per day      Occupation: Retired associate professor      Originally from [place], [place] - in [place] > 10 yrs as of 2017      Social Drivers of Health   Financial Resource Strain: Low Risk  (07/28/2021)   Overall Financial Resource Strain (CARDIA)    Difficulty of Paying Living Expenses: Not hard at all  Food Insecurity: No Food Insecurity (01/14/2023)   Hunger Vital Sign    Worried About Running Out of Food in the Last Year: Never true    Ran Out of Food in the Last Year: Never true  Transportation Needs: No Transportation Needs (01/14/2023)   PRAPARE - Administrator, Civil Service (Medical): No    Lack of Transportation (Non-Medical): No  Physical Activity: Inactive (07/28/2021)   Exercise Vital Sign    Days of Exercise per Week: 0 days    Minutes of Exercise per Session: 0 min  Stress: No Stress Concern Present (07/28/2021)   [employer] of Occupational Health - Occupational Stress Questionnaire    Feeling of Stress : Not at all  Social Connections: Moderately Isolated (08/04/2020)   Social Connection and Isolation Panel [NHANES]    Frequency of Communication with Friends and Family: Three times a week    Frequency of Social Gatherings with Friends and Family: More than three times a week    Attends Religious Services: More than 4 times per year    Active Member of Clubs or Organizations: No    Attends Banker Meetings: Never    Marital Status: Widowed  Intimate Partner Violence: Not At Risk (01/10/2023)   Humiliation, Afraid, Rape, and  Kick questionnaire    Fear of Current or Ex-Partner: No    Emotionally Abused: No    Physically Abused: No    Sexually Abused: No    Past Surgical History:  Procedure Laterality Date   BILIARY BRUSHING  12/13/2022   Procedure: BILIARY BRUSHING;  Surgeon: Avram Lupita BRAVO, MD;  Location: [facility] ENDOSCOPY;  Service: Gastroenterology;;   BILIARY STENT PLACEMENT  12/13/2022   Procedure: BILIARY STENT PLACEMENT;  Surgeon: Avram Lupita BRAVO, MD;  Location: [facility] ENDOSCOPY;  Service: Gastroenterology;;   BILIARY STENT PLACEMENT N/A 02/07/2023   Procedure: BILIARY STENT PLACEMENT;  Surgeon: Wilhelmenia Aloha Raddle., MD;  Location: [facility];  Service: Gastroenterology;  Laterality: N/A;   BIOPSY  12/23/2022   Procedure: BIOPSY;  Surgeon: Wilhelmenia Aloha Raddle., MD;  Location: [facility];  Service: Gastroenterology;;   BRONCHIAL WASHINGS  10/30/2019   Procedure: BRONCHIAL WASHINGS;  Surgeon: Gretta Leita SQUIBB, DO;  Location: [facility] ENDOSCOPY;  Service: Endoscopy;;   CARDIAC CATHETERIZATION  CATARACT EXTRACTION W/ INTRAOCULAR LENS  IMPLANT, BILATERAL Bilateral    COLONOSCOPY     ENDOSCOPIC RETROGRADE CHOLANGIOPANCREATOGRAPHY (ERCP) WITH PROPOFOL  N/A 02/07/2023   Procedure: ENDOSCOPIC RETROGRADE CHOLANGIOPANCREATOGRAPHY (ERCP) WITH PROPOFOL ;  Surgeon: Wilhelmenia Aloha Raddle., MD;  Location: [facility] ENDOSCOPY;  Service: Gastroenterology;  Laterality: N/A;  Stent change   ERCP N/A 12/13/2022   Procedure: ENDOSCOPIC RETROGRADE CHOLANGIOPANCREATOGRAPHY (ERCP);  Surgeon: Avram Lupita BRAVO, MD;  Location: [facility] ENDOSCOPY;  Service: Gastroenterology;  Laterality: N/A;  with stent placement   ESOPHAGOGASTRODUODENOSCOPY     ESOPHAGOGASTRODUODENOSCOPY (EGD) WITH PROPOFOL  N/A 12/23/2022   Procedure: ESOPHAGOGASTRODUODENOSCOPY (EGD) WITH PROPOFOL ;  Surgeon: Wilhelmenia Aloha Raddle., MD;  Location: [facility] ENDOSCOPY;  Service: Gastroenterology;  Laterality: N/A;   EUS N/A 12/23/2022   Procedure: UPPER ENDOSCOPIC ULTRASOUND (EUS) RADIAL;   Surgeon: Wilhelmenia Aloha Raddle., MD;  Location: [facility] ENDOSCOPY;  Service: Gastroenterology;  Laterality: N/A;   FINE NEEDLE ASPIRATION  12/23/2022   Procedure: FINE NEEDLE ASPIRATION (FNA) RADIAL;  Surgeon: Wilhelmenia Aloha Raddle., MD;  Location: [facility];  Service: Gastroenterology;;   IR IMAGING GUIDED PORT INSERTION  01/04/2023   KNEE ARTHROSCOPY Right 2007   KNEE ARTHROSCOPY  07/13/2011   Procedure: ARTHROSCOPY KNEE;  Surgeon: Lamar Prentice Collet;  Location: [facility];  Service: Orthopedics;  Laterality: Left;  partial menisectomy with chondrylplasty   KNEE SURGERY Left    LAMINECTOMY AND MICRODISCECTOMY LUMBAR SPINE  MARCH  2008   L3 -  4   LAPAROSCOPIC INCISIONAL / UMBILICAL / VENTRAL HERNIA REPAIR  2006   PACEMAKER IMPLANT N/A 08/02/2018   Procedure: PACEMAKER IMPLANT;  Surgeon: Waddell Danelle ORN, MD;  Location: [facility];  Service: Cardiovascular;  Laterality: N/A;   PROSTATE BIOPSY     RADIOACTIVE SEED IMPLANT N/A 02/12/2019   Procedure: RADIOACTIVE SEED IMPLANT/BRACHYTHERAPY IMPLANT;  Surgeon: Matilda Senior, MD;  Location: [facility];  Service: Urology;  Laterality: N/A;  90 MINS   REMOVAL OF STONES  02/07/2023   Procedure: REMOVAL OF Sludge;  Surgeon: Wilhelmenia Aloha Raddle., MD;  Location: [facility];  Service: Gastroenterology;;   SHOULDER ARTHROSCOPY Right 05-23-2007   SIGMOID COLECTOMY FOR CANCER  1989   SPACE OAR INSTILLATION N/A 02/12/2019   Procedure: SPACE OAR INSTILLATION;  Surgeon: Matilda Senior, MD;  Location: [facility];  Service: Urology;  Laterality: N/A;   SPHINCTEROTOMY  12/13/2022   Procedure: SPHINCTEROTOMY;  Surgeon: Avram Lupita BRAVO, MD;  Location: [facility] ENDOSCOPY;  Service: Gastroenterology;;   CLEDA REMOVAL  02/07/2023   Procedure: STENT REMOVAL;  Surgeon: Wilhelmenia Aloha Raddle., MD;  Location: [facility];  Service: Gastroenterology;;   TOTAL KNEE ARTHROPLASTY Left 05/23/2015   Procedure: LEFT TOTAL KNEE ARTHROPLASTY;  Surgeon: Lamar Collet, MD;  Location: [facility];  Service: Orthopedics;  Laterality: Left;   VERTICAL BANDED GASTROPLASTY  1986   VIDEO BRONCHOSCOPY N/A 10/30/2019   Procedure: VIDEO BRONCHOSCOPY WITH BRONICAL ALVEROLAR LAVAGE WITHOUT FLUORO;  Surgeon: Gretta Leita SQUIBB, DO;  Location: [facility] ENDOSCOPY;  Service: Endoscopy;  Laterality: N/A;    Family History  Problem Relation Age of Onset   Stroke Paternal Grandfather    Heart disease Paternal Grandfather    Esophagitis Father        died from perforated esophagus   Breast cancer Mother    Colon cancer Maternal Grandmother        questionable   Esophageal cancer Neg Hx    Prostate cancer Neg Hx    Stomach cancer Neg Hx     No Known Allergies  Current Outpatient Medications on File Prior to Visit  Medication Sig Dispense Refill   acetaminophen  (TYLENOL ) 325 MG tablet Take 2 tablets (650 mg total) by mouth every 6 (six) hours as needed for mild pain (or Fever >/= 101). 20 tablet 0   allopurinol  (ZYLOPRIM ) 300 MG tablet Take 1 tablet (300 mg total) by mouth daily. 90 tablet 3   apixaban  (ELIQUIS ) 5 MG TABS tablet Take 1 tablet (5 mg total) by mouth 2 (two) times daily. 180 tablet 1   Calcium  Carb-Cholecalciferol  (CALCIUM  600 + D PO) Take 1 tablet by mouth daily. 800 units of vitamin D      cyanocobalamin  (VITAMIN B12) 500 MCG tablet Take 1 tablet by mouth 2 (two) times daily.     dicyclomine  (BENTYL ) 20 MG tablet Take 0.5 tablets (10 mg total) by mouth 2 (two) times daily for 5 days. 5 tablet 0   docusate sodium  (COLACE) 100 MG capsule Take 100 mg by mouth 2 (two) times daily.     Eszopiclone  3 MG TABS Take 1 tablet (3 mg total) by mouth at bedtime. Take immediately before bedtime 30 tablet 2   ipratropium (ATROVENT ) 0.03 % nasal spray Place 2 sprays into both nostrils every 12 (twelve) hours. 30 mL 1   lidocaine -prilocaine  (EMLA ) cream Apply 1 Application topically as needed. 30 g 0   mirabegron  ER (MYRBETRIQ ) 50 MG TB24 tablet Take 1 tablet by mouth daily.      mirtazapine  (REMERON ) 15 MG tablet Take 1 tablet (15 mg total) by mouth at bedtime. 30 tablet 2   Multiple Vitamin (MULTIVITAMIN) tablet Take 1 tablet by mouth in the morning.     oxybutynin  (DITROPAN ) 5 MG tablet Take 2.5 mg by mouth 2 (two) times daily.     oxyCODONE  (ROXICODONE ) 5 MG immediate release tablet Take 1 tablet (5 mg total) by mouth every 8 (eight) hours as needed for severe pain (pain score 7-10). 10 tablet 0   polyethylene glycol powder (GLYCOLAX /MIRALAX ) 17 GM/SCOOP powder Take 17g (1 capful) by mouth as directed daily as needed for mild constipation. 238 g 0   potassium chloride  (KLOR-CON  M) 10 MEQ tablet Take 1 tablet (10 mEq total) by mouth 2 (two) times daily. 60 tablet 1   Respiratory Therapy Supplies (FLUTTER) DEVI 1 each by Does not apply route in the morning and at bedtime. 1 each 0   sodium chloride  HYPERTONIC 3 % nebulizer solution Take by nebulization as needed for other. 750 mL 1   tamsulosin  (FLOMAX ) 0.4 MG CAPS capsule Take 1 capsule by mouth daily.     traZODone  (DESYREL ) 50 MG tablet Take 1 tablet by mouth at bedtime as needed.     valsartan -hydrochlorothiazide  (DIOVAN -HCT) 160-25 MG tablet Take 1 tablet by mouth daily. 90 tablet 1   Current Facility-Administered Medications on File Prior to Visit  Medication Dose Route Frequency Provider Last Rate Last Admin   sodium chloride  flush (NS) 0.9 % injection 10 mL  10 mL Intravenous PRN Cloretta Arley NOVAK, MD   10 mL at 05/12/23 1134    BP 100/70   Pulse 66   Temp 98.1 F (36.7 C)   SpO2 97%       Objective:   Physical Exam Vitals and nursing note reviewed.  Constitutional:      Appearance: Normal appearance.  Cardiovascular:     Rate and Rhythm: Normal rate and regular rhythm.     Pulses: Normal pulses.     Heart sounds: Normal heart sounds.  Pulmonary:  Effort: Pulmonary effort is normal.     Breath sounds: Normal breath sounds.  Abdominal:     General: Bowel sounds are normal. There is  distension.     Tenderness: There is no abdominal tenderness.  Musculoskeletal:        General: Normal range of motion.  Skin:    General: Skin is warm and dry.  Neurological:     General: No focal deficit present.     Mental Status: He is alert and oriented to person, place, and time.  Psychiatric:        Mood and Affect: Mood normal.        Behavior: Behavior normal.        Thought Content: Thought content normal.        Judgment: Judgment normal.           Assessment & Plan:  1. Diarrhea, unspecified type (Primary) -We reviewed his labs and CT scan during the office visit today.  Advise likely diarrhea is from taking too much Ex-Lax and continue with Colace.  Will have him hold his Colace and take it as needed in the future.  Stay hydrated and bland diet.  If symptoms do not improve in the next 24 hours then let me know.  2. Nausea  - ondansetron  (ZOFRAN ) 4 MG tablet; Take 1 tablet (4 mg total) by mouth every 4 (four) hours as needed for nausea or vomiting.  Dispense: 12 tablet; Refill: 0  3. Gastroesophageal reflux disease without esophagitis -Will have him restart Nexium  40 mg twice daily for suspected acid reflux. - esomeprazole  (NEXIUM ) 40 MG capsule; Take 1 capsule (40 mg total) by mouth 2 (two) times daily before a meal.  Dispense: 60 capsule; Refill: 12  Darleene Shape, NP  Time spent with patient today was 32 minutes which consisted of chart review, discussing GERD, nausea and diarrhea , work up, treatment answering questions and documentation.

## 2023-08-11 ENCOUNTER — Other Ambulatory Visit (HOSPITAL_BASED_OUTPATIENT_CLINIC_OR_DEPARTMENT_OTHER): Payer: Self-pay

## 2023-08-11 ENCOUNTER — Other Ambulatory Visit: Payer: Self-pay | Admitting: Adult Health

## 2023-08-11 ENCOUNTER — Encounter: Payer: Self-pay | Admitting: Oncology

## 2023-08-13 ENCOUNTER — Other Ambulatory Visit: Payer: Self-pay

## 2023-08-13 ENCOUNTER — Emergency Department (HOSPITAL_BASED_OUTPATIENT_CLINIC_OR_DEPARTMENT_OTHER)
Admission: EM | Admit: 2023-08-13 | Discharge: 2023-08-14 | Disposition: A | Discharge status: Home / Self Care | Payer: Non-veteran care | Attending: Emergency Medicine | Admitting: Emergency Medicine

## 2023-08-13 DIAGNOSIS — K5900 Constipation, unspecified: Secondary | ICD-10-CM | POA: Diagnosis not present

## 2023-08-13 DIAGNOSIS — Z8546 Personal history of malignant neoplasm of prostate: Secondary | ICD-10-CM | POA: Insufficient documentation

## 2023-08-13 DIAGNOSIS — Z96659 Presence of unspecified artificial knee joint: Secondary | ICD-10-CM | POA: Insufficient documentation

## 2023-08-13 DIAGNOSIS — Z20822 Contact with and (suspected) exposure to covid-19: Secondary | ICD-10-CM | POA: Insufficient documentation

## 2023-08-13 DIAGNOSIS — C259 Malignant neoplasm of pancreas, unspecified: Secondary | ICD-10-CM | POA: Insufficient documentation

## 2023-08-13 DIAGNOSIS — I2510 Atherosclerotic heart disease of native coronary artery without angina pectoris: Secondary | ICD-10-CM | POA: Diagnosis not present

## 2023-08-13 DIAGNOSIS — Z950 Presence of cardiac pacemaker: Secondary | ICD-10-CM | POA: Diagnosis not present

## 2023-08-13 DIAGNOSIS — Z79899 Other long term (current) drug therapy: Secondary | ICD-10-CM | POA: Insufficient documentation

## 2023-08-13 DIAGNOSIS — Z7901 Long term (current) use of anticoagulants: Secondary | ICD-10-CM | POA: Diagnosis not present

## 2023-08-13 DIAGNOSIS — I10 Essential (primary) hypertension: Secondary | ICD-10-CM | POA: Insufficient documentation

## 2023-08-13 DIAGNOSIS — R1012 Left upper quadrant pain: Secondary | ICD-10-CM | POA: Diagnosis present

## 2023-08-13 DIAGNOSIS — R1010 Upper abdominal pain, unspecified: Secondary | ICD-10-CM

## 2023-08-13 LAB — CBC
HCT: 32.7 % — ABNORMAL LOW (ref 39.0–52.0)
Hemoglobin: 10.3 g/dL — ABNORMAL LOW (ref 13.0–17.0)
MCH: 27.4 pg (ref 26.0–34.0)
MCHC: 31.5 g/dL (ref 30.0–36.0)
MCV: 87 fL (ref 80.0–100.0)
Platelets: 191 10*3/uL (ref 150–400)
RBC: 3.76 MIL/uL — ABNORMAL LOW (ref 4.22–5.81)
RDW: 16.8 % — ABNORMAL HIGH (ref 11.5–15.5)
WBC: 4.7 10*3/uL (ref 4.0–10.5)
nRBC: 0 % (ref 0.0–0.2)

## 2023-08-13 LAB — COMPREHENSIVE METABOLIC PANEL
ALT: 15 U/L (ref 0–44)
AST: 18 U/L (ref 15–41)
Albumin: 3.9 g/dL (ref 3.5–5.0)
Alkaline Phosphatase: 95 U/L (ref 38–126)
Anion gap: 10 (ref 5–15)
BUN: 25 mg/dL — ABNORMAL HIGH (ref 8–23)
CO2: 24 mmol/L (ref 22–32)
Calcium: 9.1 mg/dL (ref 8.9–10.3)
Chloride: 104 mmol/L (ref 98–111)
Creatinine, Ser: 1.22 mg/dL (ref 0.61–1.24)
GFR, Estimated: 59 mL/min — ABNORMAL LOW (ref >60)
Glucose, Bld: 117 mg/dL — ABNORMAL HIGH (ref 70–99)
Potassium: 3.5 mmol/L (ref 3.5–5.1)
Sodium: 138 mmol/L (ref 135–145)
Total Bilirubin: 0.4 mg/dL (ref 0.0–1.2)
Total Protein: 7.4 g/dL (ref 6.5–8.1)

## 2023-08-13 LAB — LIPASE, BLOOD: Lipase: 34 U/L (ref 11–51)

## 2023-08-13 LAB — URINALYSIS, ROUTINE W REFLEX MICROSCOPIC
Bilirubin Urine: NEGATIVE
Glucose, UA: NEGATIVE mg/dL
Hgb urine dipstick: NEGATIVE
Ketones, ur: NEGATIVE mg/dL
Leukocytes,Ua: NEGATIVE
Nitrite: NEGATIVE
Protein, ur: NEGATIVE mg/dL
Specific Gravity, Urine: 1.022 (ref 1.005–1.030)
pH: 5 (ref 5.0–8.0)

## 2023-08-13 LAB — TROPONIN I (HIGH SENSITIVITY): Troponin I (High Sensitivity): 8 ng/L (ref <18)

## 2023-08-13 LAB — RESP PANEL BY RT-PCR (RSV, FLU A&B, COVID)  RVPGX2
Influenza A by PCR: NEGATIVE
Influenza B by PCR: NEGATIVE
Resp Syncytial Virus by PCR: NEGATIVE
SARS Coronavirus 2 by RT PCR: NEGATIVE

## 2023-08-13 NOTE — ED Provider Notes (Signed)
 [place] EMERGENCY DEPARTMENT AT [facility] Provider Note   CSN: [id number] Arrival date & time: 08/13/23  1840     History  Chief Complaint  Patient presents with   Abdominal Pain    Marcus [name] is a 83 y.o. male.  HPI      Upper left quadrant pain, has been present for a few days Has not had BM in at least 4 days, distended to the point, having burping and passing gas Was abdominal pain but now is predominantly upper left abdomen and is becoming more severe over the last 2 days. Took pain meds around 6PM and feeling better. Some gas but not as much as before.  Whole abdomen is not as hard as before.  Hernia in middle popped up yesterday. Nausea, small amount of vomiting No pain with urination  No fever Difficult to take a deep breath, feels like not enough room in there.  Shortness of breath yesterday and today.   Has been taking eliquis  but only once  aday for hte last 5 days due to price and [facility]   Past Medical History:  Diagnosis Date   Acute meniscal tear of knee left   Arthritis    generalized   AV block 08/01/2018   Benign essential hypertension 01/18/2007   Bronchiectasis with acute exacerbation 06/23/2020   Chest pain, atypical 08/23/2014   Chronic cough 10/07/2014   Coronary artery disease    Degeneration of lumbar intervertebral disc 10/14/2017   Diverticulosis of colon 07/07/2005   Elevated PSA 06/19/2014   GERD (gastroesophageal reflux disease) 01/18/2007   Gout, unspecified 01/18/2007   Hemorrhoids 01/19/2011   Hiatal hernia    History of colonic polyps 01/18/2007   Insomnia, unspecified 08/21/2007   Iron deficiency anemia, unspecified  01/19/2011   Lumbar post-laminectomy syndrome 10/14/2017   Lumbar spondylolysis    Lumbar strain 12/14/2011   Obstructive sleep apnea 01/18/2007   uses CPAP nightly   Paraesophageal hernia    large   Primary osteoarthritis of knee 05/23/2015   Prostate cancer (HCC) 2016   S/P knee  replacement 05/23/2015   Sensorineural hearing loss, bilateral    Vitamin B12 deficiency    Vitamin D  deficiency 10/28/2009    Past Surgical History:  Procedure Laterality Date   BILIARY BRUSHING  12/13/2022   Procedure: BILIARY BRUSHING;  Surgeon: Avram Lupita BRAVO, MD;  Location: [facility] ENDOSCOPY;  Service: Gastroenterology;;   BILIARY STENT PLACEMENT  12/13/2022   Procedure: BILIARY STENT PLACEMENT;  Surgeon: Avram Lupita BRAVO, MD;  Location: [facility] ENDOSCOPY;  Service: Gastroenterology;;   BILIARY STENT PLACEMENT N/A 02/07/2023   Procedure: BILIARY STENT PLACEMENT;  Surgeon: Wilhelmenia Aloha Raddle., MD;  Location: [facility];  Service: Gastroenterology;  Laterality: N/A;   BIOPSY  12/23/2022   Procedure: BIOPSY;  Surgeon: Wilhelmenia Aloha Raddle., MD;  Location: [facility];  Service: Gastroenterology;;   BRONCHIAL WASHINGS  10/30/2019   Procedure: BRONCHIAL WASHINGS;  Surgeon: Gretta Leita SQUIBB, DO;  Location: [facility] ENDOSCOPY;  Service: Endoscopy;;   CARDIAC CATHETERIZATION     CATARACT EXTRACTION W/ INTRAOCULAR LENS  IMPLANT, BILATERAL Bilateral    COLONOSCOPY     ENDOSCOPIC RETROGRADE CHOLANGIOPANCREATOGRAPHY (ERCP) WITH PROPOFOL  N/A 02/07/2023   Procedure: ENDOSCOPIC RETROGRADE CHOLANGIOPANCREATOGRAPHY (ERCP) WITH PROPOFOL ;  Surgeon: Wilhelmenia Aloha Raddle., MD;  Location: [facility];  Service: Gastroenterology;  Laterality: N/A;  Stent change   ERCP N/A 12/13/2022   Procedure: ENDOSCOPIC RETROGRADE CHOLANGIOPANCREATOGRAPHY (ERCP);  Surgeon: Avram Lupita BRAVO, MD;  Location: [facility] ENDOSCOPY;  Service: Gastroenterology;  Laterality: N/A;  with stent placement   ESOPHAGOGASTRODUODENOSCOPY     ESOPHAGOGASTRODUODENOSCOPY (EGD) WITH PROPOFOL  N/A 12/23/2022   Procedure: ESOPHAGOGASTRODUODENOSCOPY (EGD) WITH PROPOFOL ;  Surgeon: Wilhelmenia Aloha Raddle., MD;  Location: [facility] ENDOSCOPY;  Service: Gastroenterology;  Laterality: N/A;   EUS N/A 12/23/2022   Procedure: UPPER ENDOSCOPIC ULTRASOUND (EUS) RADIAL;  Surgeon:  Wilhelmenia Aloha Raddle., MD;  Location: [facility] ENDOSCOPY;  Service: Gastroenterology;  Laterality: N/A;   FINE NEEDLE ASPIRATION  12/23/2022   Procedure: FINE NEEDLE ASPIRATION (FNA) RADIAL;  Surgeon: Wilhelmenia Aloha Raddle., MD;  Location: [facility];  Service: Gastroenterology;;   IR IMAGING GUIDED PORT INSERTION  01/04/2023   KNEE ARTHROSCOPY Right 2007   KNEE ARTHROSCOPY  07/13/2011   Procedure: ARTHROSCOPY KNEE;  Surgeon: Lamar Prentice Collet;  Location: [facility];  Service: Orthopedics;  Laterality: Left;  partial menisectomy with chondrylplasty   KNEE SURGERY Left    LAMINECTOMY AND MICRODISCECTOMY LUMBAR SPINE  MARCH  2008   L3 -  4   LAPAROSCOPIC INCISIONAL / UMBILICAL / VENTRAL HERNIA REPAIR  2006   PACEMAKER IMPLANT N/A 08/02/2018   Procedure: PACEMAKER IMPLANT;  Surgeon: Waddell Danelle ORN, MD;  Location: [facility]VASIVE CV [facility];  Service: Cardiovascular;  Laterality: N/A;   PROSTATE BIOPSY     RADIOACTIVE SEED IMPLANT N/A 02/12/2019   Procedure: RADIOACTIVE SEED IMPLANT/BRACHYTHERAPY IMPLANT;  Surgeon: Matilda Senior, MD;  Location: [facility] ORS;  Service: Urology;  Laterality: N/A;  90 MINS   REMOVAL OF STONES  02/07/2023   Procedure: REMOVAL OF Sludge;  Surgeon: Wilhelmenia Aloha Raddle., MD;  Location: [facility];  Service: Gastroenterology;;   SHOULDER ARTHROSCOPY Right 05-23-2007   SIGMOID COLECTOMY FOR CANCER  1989   SPACE OAR INSTILLATION N/A 02/12/2019   Procedure: SPACE OAR INSTILLATION;  Surgeon: Matilda Senior, MD;  Location: [facility];  Service: Urology;  Laterality: N/A;   SPHINCTEROTOMY  12/13/2022   Procedure: SPHINCTEROTOMY;  Surgeon: Avram Lupita BRAVO, MD;  Location: [facility] ENDOSCOPY;  Service: Gastroenterology;;   CLEDA REMOVAL  02/07/2023   Procedure: STENT REMOVAL;  Surgeon: Wilhelmenia Aloha Raddle., MD;  Location: [facility];  Service: Gastroenterology;;   TOTAL KNEE ARTHROPLASTY Left 05/23/2015   Procedure: LEFT TOTAL KNEE ARTHROPLASTY;  Surgeon: Lamar Collet, MD;   Location: [facility];  Service: Orthopedics;  Laterality: Left;   VERTICAL BANDED GASTROPLASTY  1986   VIDEO BRONCHOSCOPY N/A 10/30/2019   Procedure: VIDEO BRONCHOSCOPY WITH BRONICAL ALVEROLAR LAVAGE WITHOUT FLUORO;  Surgeon: Gretta Leita SQUIBB, DO;  Location: [facility] ENDOSCOPY;  Service: Endoscopy;  Laterality: N/A;    Home Medications Prior to Admission medications   Medication Sig Start Date End Date Taking? Authorizing Provider  glycerin  adult 2 g suppository Place 1 suppository rectally as needed for constipation. 08/14/23  Yes Jerrol Agent, MD  polyethylene glycol powder (GLYCOLAX /MIRALAX ) 17 GM/SCOOP powder Mix 4 capfuls in a 32 oz Gatorade, Can repeat the next day for a satisfactory bowel movement. THEN one capful daily for maintenance 08/14/23  Yes Jerrol Agent, MD  acetaminophen  (TYLENOL ) 325 MG tablet Take 2 tablets (650 mg total) by mouth every 6 (six) hours as needed for mild pain (or Fever >/= 101). 01/13/23   Sheikh, Omair Latif, DO  allopurinol  (ZYLOPRIM ) 300 MG tablet Take 1 tablet (300 mg total) by mouth daily. 08/09/23   Nafziger, Darleene, NP  apixaban  (ELIQUIS ) 5 MG TABS tablet Take 1 tablet (5 mg total) by mouth 2 (two) times daily. 02/23/23 09/07/23  Merna Darleene, NP  Calcium  Carb-Cholecalciferol  (CALCIUM  600 +  D PO) Take 1 tablet by mouth daily. 800 units of vitamin D     [provider]  cyanocobalamin  (VITAMIN B12) 500 MCG tablet Take 1 tablet by mouth 2 (two) times daily. 07/26/23   [provider]  dicyclomine  (BENTYL ) 20 MG tablet Take 0.5 tablets (10 mg total) by mouth 2 (two) times daily for 5 days. 08/08/23 08/13/23  Elnor Jayson LABOR, DO  docusate sodium  (COLACE) 100 MG capsule Take 100 mg by mouth 2 (two) times daily.    [provider]  esomeprazole  (NEXIUM ) 40 MG capsule Take 1 capsule (40 mg total) by mouth 2 (two) times daily before a meal. 08/10/23   Nafziger, Darleene, NP  Eszopiclone  3 MG TABS Take 1 tablet (3 mg total) by mouth at bedtime. Take immediately before  bedtime 06/14/23   Nafziger, Darleene, NP  ipratropium (ATROVENT ) 0.03 % nasal spray Place 2 sprays into both nostrils every 12 (twelve) hours. 05/03/22     lidocaine -prilocaine  (EMLA ) cream Apply 1 Application topically as needed. 12/31/22   Cloretta Arley NOVAK, MD  mirabegron  ER (MYRBETRIQ ) 50 MG TB24 tablet Take 1 tablet by mouth daily. 06/21/23   [provider]  mirtazapine  (REMERON ) 15 MG tablet Take 1 tablet (15 mg total) by mouth at bedtime. 06/10/23   Cloretta Arley NOVAK, MD  Multiple Vitamin (MULTIVITAMIN) tablet Take 1 tablet by mouth in the morning.    [provider]  ondansetron  (ZOFRAN ) 4 MG tablet Take 1 tablet (4 mg total) by mouth every 4 (four) hours as needed for nausea or vomiting. 08/10/23   Nafziger, Darleene, NP  oxybutynin  (DITROPAN ) 5 MG tablet Take 2.5 mg by mouth 2 (two) times daily.    [provider]  potassium chloride  (KLOR-CON  M) 10 MEQ tablet Take 1 tablet (10 mEq total) by mouth 2 (two) times daily. 03/17/23   Cloretta Arley NOVAK, MD  Respiratory Therapy Supplies (FLUTTER) DEVI 1 each by Does not apply route in the morning and at bedtime. 10/11/19   Gretta Doffing P, DO  sodium chloride  HYPERTONIC 3 % nebulizer solution Take by nebulization as needed for other. 04/01/22   Hope Almarie ORN, NP  tamsulosin  (FLOMAX ) 0.4 MG CAPS capsule Take 1 capsule by mouth daily. 06/21/23   [provider]  traZODone  (DESYREL ) 50 MG tablet Take 1 tablet by mouth at bedtime as needed. 07/26/23   [provider]  valsartan -hydrochlorothiazide  (DIOVAN -HCT) 160-25 MG tablet Take 1 tablet by mouth daily. 04/05/23   Nafziger, Cory, NP      Allergies    Patient has no known allergies.    Review of Systems   Review of Systems  Physical Exam Updated Vital Signs BP (!) 155/73   Pulse 61   Temp 98 F (36.7 C) (Oral)   Resp 13   Ht 5' 8 (1.727 m)   Wt 95.3 kg   SpO2 96%   BMI 31.93 kg/m  Physical Exam Vitals and nursing note reviewed.  Constitutional:       General: He is not in acute distress.    Appearance: He is well-developed. He is not diaphoretic.  HENT:     Head: Normocephalic and atraumatic.  Eyes:     Conjunctiva/sclera: Conjunctivae normal.  Cardiovascular:     Rate and Rhythm: Normal rate and regular rhythm.     Heart sounds: Normal heart sounds. No murmur heard.    No friction rub. No gallop.  Pulmonary:     Effort: Pulmonary effort is normal. No respiratory distress.  Breath sounds: Normal breath sounds. No wheezing or rales.  Abdominal:     General: There is no distension.     Palpations: Abdomen is soft.     Tenderness: There is abdominal tenderness in the epigastric area and left upper quadrant. There is no guarding. Negative signs include Murphy's sign.  Musculoskeletal:     Cervical back: Normal range of motion.  Skin:    General: Skin is warm and dry.  Neurological:     Mental Status: He is alert and oriented to person, place, and time.     ED Results / Procedures / Treatments   Labs (all labs ordered are listed, but only abnormal results are displayed) Labs Reviewed  COMPREHENSIVE METABOLIC PANEL - Abnormal; Notable for the following components:      Result Value   Glucose, Bld 117 (*)    BUN 25 (*)    GFR, Estimated 59 (*)    All other components within normal limits  CBC - Abnormal; Notable for the following components:   RBC 3.76 (*)    Hemoglobin 10.3 (*)    HCT 32.7 (*)    RDW 16.8 (*)    All other components within normal limits  RESP PANEL BY RT-PCR (RSV, FLU A&B, COVID)  [id number]  LIPASE, BLOOD  URINALYSIS, ROUTINE W REFLEX MICROSCOPIC  LACTIC ACID, PLASMA  TROPONIN I (HIGH SENSITIVITY)  TROPONIN I (HIGH SENSITIVITY)    EKG EKG Interpretation Date/Time:  Saturday August 13 2023 20:49:04 EST Ventricular Rate:  66 PR Interval:  146 QRS Duration:  170 QT Interval:  498 QTC Calculation: 502 R Axis:   -61  Text Interpretation: paced rhythm Confirmed by Lenor Hollering [phone]) on  08/14/2023 11:30:29 AM  Radiology CT ABDOMEN PELVIS W CONTRAST Addendum Date: 08/14/2023 ADDENDUM REPORT: 08/14/2023 01:56 ADDENDUM: Vascular. There is mild narrowing at the origin of the celiac artery due to calcified plaque. Remainder of the celiac artery is patent. Superior mesenteric artery and inferior mesenteric artery widely patent. Electronically Signed   By: Franky Crease M.D.   On: 08/14/2023 01:56   Result Date: 08/14/2023 CLINICAL DATA:  Left upper quadrant pain EXAM: CT ABDOMEN AND PELVIS WITH CONTRAST TECHNIQUE: Multidetector CT imaging of the abdomen and pelvis was performed using the standard protocol following bolus administration of intravenous contrast. RADIATION DOSE REDUCTION: This exam was performed according to the departmental dose-optimization program which includes automated exposure control, adjustment of the mA and/or kV according to patient size and/or use of iterative reconstruction technique. CONTRAST:  OMNIPAQUE  IOHEXOL  350 MG/ML SOLN COMPARISON:  08/08/2023 FINDINGS: Lower chest: Bibasilar atelectasis, left greater than right. Pacer wires in the right heart. Hepatobiliary: Pneumobilia again noted. Metallic biliary stent is unchanged. Scattered cysts in the liver. No suspicious focal hepatic abnormality. Layering gallstones within the gallbladder. Mild surrounding pericholecystic inflammation again noted, unchanged. Pancreas: Again seen is the hypodense lesion in the pancreatic head/neck region, unchanged. Pancreatic ductal dilatation in the body and tail stable. No change since prior study. Spleen: No focal abnormality.  Normal size. Adrenals/Urinary Tract: No suspicious renal or adrenal abnormality. No stones or hydronephrosis. Urinary bladder unremarkable. Stomach/Bowel: Stable postoperative changes from partial gastrectomy. No bowel obstruction. Vascular/Lymphatic: Aortic atherosclerosis. No evidence of aneurysm or adenopathy. Reproductive: Radiation seeds in the prostate.  Other: No free fluid or free air. Musculoskeletal: No acute bony abnormality. Degenerative changes in the lumbar spine. IMPRESSION: Stable appearance of the hypodense lesion in the pancreatic head with pancreatic ductal dilatation. Cholelithiasis.  Stable pericholecystic inflammation.  Biliary stent with pneumobilia unchanged. Stable changes from prior partial gastrectomy. No evidence of bowel obstruction. Aortic atherosclerosis. Electronically Signed: By: Franky Crease M.D. On: 08/14/2023 01:08   CT Angio Chest PE W and/or Wo Contrast Result Date: 08/14/2023 CLINICAL DATA:  Left upper quadrant pain EXAM: CT ANGIOGRAPHY CHEST WITH CONTRAST TECHNIQUE: Multidetector CT imaging of the chest was performed using the standard protocol during bolus administration of intravenous contrast. Multiplanar CT image reconstructions and MIPs were obtained to evaluate the vascular anatomy. RADIATION DOSE REDUCTION: This exam was performed according to the departmental dose-optimization program which includes automated exposure control, adjustment of the mA and/or kV according to patient size and/or use of iterative reconstruction technique. CONTRAST:  OMNIPAQUE  IOHEXOL  350 MG/ML SOLN COMPARISON:  01/09/2023 FINDINGS: Cardiovascular: No filling defects in the pulmonary arteries to suggest pulmonary emboli. Cardiomegaly. Pacer wires in the right heart. Coronary artery and aortic atherosclerosis. Mediastinum/Nodes: No mediastinal, hilar, or axillary adenopathy. Trachea and esophagus are unremarkable. Thyroid  unremarkable. Lungs/Pleura: Bibasilar atelectasis, left greater than right. No effusions or suspicious pulmonary nodules. Upper Abdomen: See abdominal CT report. Musculoskeletal: No acute bony abnormality. Chest wall soft tissues are unremarkable. Review of the MIP images confirms the above findings. IMPRESSION: No evidence of pulmonary embolus. Cardiomegaly, coronary artery disease. Bibasilar atelectasis, left greater than  right. Aortic Atherosclerosis (ICD10-I70.0). Electronically Signed   By: Franky Crease M.D.   On: 08/14/2023 01:03    Procedures Procedures    Medications Ordered in ED Medications  iohexol  (OMNIPAQUE ) 350 MG/ML injection 100 mL (100 mLs Intravenous Contrast Given 08/14/23 0020)    ED Course/ Medical Decision Making/ A&P                                   83 year old male with a history of locally invasive pancreatic cancer, not a surgical candidate based on his age, comorbid conditions, vascular involvement, with biliary stent, CAD, hx prostate cancer, afib on eliquis  (has been taking once a day recently due to insurance reasons/needing new rx), pacemaker in place due to hx heart block, visit to ED 2/3 with CT showing stable pancreatic mass and progressive narrowing to near occlusion of portal vein, cholelithiasis, who presents with concern for worsening LUQ abdominal pain, as well as pleuritic pain an dyspnea.  DDx includes appendicitis, pancreatitis, cholecystitis, pyelonephritis, nephrolithiasis, diverticulitis, ACS, PE, mesenteric ischemia, cancer related pain, constipation.  Labs completed and personally evaluated interpreted by me show stable anemia, no leukocytosis, no transaminitis, no signs of pancreatitis, negative COVID, flu RSV testing, no evidence of urinary tract infection, normal troponin and doubt ACS.  EKG was completed and personally about interpreted by me showed paced rhythm.  Given sensation of dyspnea with left-sided pleuritic pain, not currently taking therapeutic anticoagulation and cancer history, have ordered a CT PE study, and given worsening abdominal pain have ordered a repeat CT abdomen pelvis.  He does not have right upper quadrant tenderness or pain and my suspicion for cholecystitis is low.  Signed out to Dr. Jerrol with CT pending          Final Clinical Impression(s) / ED Diagnoses Final diagnoses:  Pain of upper abdomen  Malignant neoplasm of  pancreas, unspecified location of malignancy (HCC)  Constipation, unspecified constipation type    Rx / DC Orders ED Discharge Orders          Ordered    polyethylene glycol powder (GLYCOLAX /MIRALAX ) 17 GM/SCOOP powder  Daily       Note to Pharmacy: Sig updated per secure chat with Dr.Lawsing   08/14/23 0154    glycerin  adult 2 g suppository  As needed        08/14/23 0154              Dreama Longs, MD 08/15/23 1020

## 2023-08-13 NOTE — ED Triage Notes (Signed)
 Pt POV from home reporting LUQ abd pain past few days + bloating, gas, and constipation. Similar sx 2/3, seen in ED, results inconclusive.

## 2023-08-13 NOTE — ED Notes (Signed)
 Pt ambulates with cane independently to bathroom, stead gait.

## 2023-08-13 NOTE — ED Provider Notes (Signed)
  Physical Exam  BP 137/72   Pulse 67   Temp 98 F (36.7 C) (Oral)   Resp 18   Ht 5' 8 (1.727 m)   Wt 95.3 kg   SpO2 96%   BMI 31.93 kg/m   Physical Exam  Procedures  Procedures  ED Course / MDM    Medical Decision Making Amount and/or Complexity of Data Reviewed Labs: ordered. Radiology: ordered.  Risk OTC drugs. Prescription drug management.   61M, hx of pancreatic cancer, presenting with abdominal pain, constipation. CT showed stable mass when scanned a few days ago. Possible portal vein compression from his tumor. Severe worsening abdominal pain. Has been intermittently compliant with his Eliquis . CT abdomen pelvis pending in addition to CTA PE study.    CTA: IMPRESSION:  No evidence of pulmonary embolus.    Cardiomegaly, coronary artery disease.    Bibasilar atelectasis, left greater than right.    Aortic Atherosclerosis (ICD10-I70.0).    CT Abdomen Pelvis: IMPRESSION:  Stable appearance of the hypodense lesion in the pancreatic head  with pancreatic ductal dilatation.    Cholelithiasis.  Stable pericholecystic inflammation.    Biliary stent with pneumobilia unchanged.    Stable changes from prior partial gastrectomy. No evidence of bowel  obstruction.    Aortic atherosclerosis.    Patient is asymptomatic on repeat assessment.  He endorses pain that comes on after eating.  Discussed with radiology, patient CT abdomen pelvis shows no clear evidence of significant stenosis mild stenosis about the celiac, no evidence of stenosis of the SMA or IMA.  Patient overall stable and pain-free, stable for outpatient follow-up.  Endorses constipation with no bowel movement over the last 4 days.  Recommended MiraLAX  outpatient and outpatient glycerin  suppository as needed.  Patient open offered enema in the ER which he declined.  Stable for discharge and PCP follow-up.    Jerrol Agent, MD 08/14/23 [phone]

## 2023-08-14 ENCOUNTER — Emergency Department (HOSPITAL_BASED_OUTPATIENT_CLINIC_OR_DEPARTMENT_OTHER): Payer: Non-veteran care

## 2023-08-14 ENCOUNTER — Other Ambulatory Visit: Payer: Self-pay

## 2023-08-14 LAB — TROPONIN I (HIGH SENSITIVITY): Troponin I (High Sensitivity): 9 ng/L (ref <18)

## 2023-08-14 LAB — LACTIC ACID, PLASMA: Lactic Acid, Venous: 0.6 mmol/L (ref 0.5–1.9)

## 2023-08-14 MED ORDER — POLYETHYLENE GLYCOL 3350 17 GM/SCOOP PO POWD
68.0000 g | Freq: Every day | ORAL | 0 refills | Status: DC
  Filled 2023-08-14: qty 238, 3d supply, fill #0

## 2023-08-14 MED ORDER — MAGNESIUM CITRATE PO SOLN: 1.0000 | Freq: Once | ORAL | Status: DC

## 2023-08-14 MED ORDER — FLEET ENEMA RE ENEM: 1.0000 | ENEMA | Freq: Once | RECTAL | Status: DC

## 2023-08-14 MED ORDER — IOHEXOL 350 MG/ML SOLN
100.0000 mL | Freq: Once | INTRAVENOUS | Status: AC | PRN
  Administered 2023-08-14: 100 mL via INTRAVENOUS

## 2023-08-14 MED ORDER — GLYCERIN (ADULT) 2 G RE SUPP
1.0000 | RECTAL | 0 refills | Status: DC | PRN
  Filled 2023-08-14: qty 12, 12d supply, fill #0

## 2023-08-14 NOTE — Discharge Instructions (Addendum)
 Your CT imaging was stable: IMPRESSION:  Stable appearance of the hypodense lesion in the pancreatic head  with pancreatic ductal dilatation.    Cholelithiasis.  Stable pericholecystic inflammation.    Biliary stent with pneumobilia unchanged.    Stable changes from prior partial gastrectomy. No evidence of bowel  obstruction.    Aortic atherosclerosis.   You were offered an enema but declined.  Recommend you try glycerin  suppositories in addition to MiraLAX  as needed. Opiates for pain can be constipating. At home, Take 4 capfuls of MiraLAX  in a 32 ounce Gatorade and drink over the course of 4 hours, can repeat the following day for a satisfactory bowel movement.  Return for any severe worsening symptoms otherwise follow-up with your primary care provider.

## 2023-08-15 ENCOUNTER — Other Ambulatory Visit (HOSPITAL_BASED_OUTPATIENT_CLINIC_OR_DEPARTMENT_OTHER): Payer: Self-pay

## 2023-08-15 ENCOUNTER — Encounter: Payer: Self-pay | Admitting: Oncology

## 2023-08-15 ENCOUNTER — Telehealth: Payer: Self-pay

## 2023-08-15 ENCOUNTER — Ambulatory Visit: Payer: Medicare Other

## 2023-08-15 DIAGNOSIS — I4430 Unspecified atrioventricular block: Secondary | ICD-10-CM | POA: Diagnosis not present

## 2023-08-15 NOTE — Transitions of Care (Post Inpatient/ED Visit) (Signed)
 08/15/2023  Name: Marcus [name] MRN: [id number] DOB: 1940-12-04  Today's TOC FU Call Status: Today's TOC FU Call Status:: Successful TOC FU Call Completed TOC FU Call Complete Date: 08/15/23 Patient's Name and Date of Birth confirmed.  Transition Care Management Follow-up Telephone Call Date of Discharge: 08/14/23 Discharge Facility: [facility]) Type of Discharge: Emergency Department Reason for ED Visit: Other: (neoplasm) How have you been since you were released from the hospital?: Same Any questions or concerns?: No  Items Reviewed: Did you receive and understand the discharge instructions provided?: Yes Medications obtained,verified, and reconciled?: Yes (Medications Reviewed) Any new allergies since your discharge?: No Dietary orders reviewed?: Yes Do you have support at home?: Yes People in Home: child(ren), adult  Medications Reviewed Today: Medications Reviewed Today     Reviewed by Darrall Ellison, LPN (Licensed Practical Nurse) on 08/15/23 at 1711  Med List Status: <None>   Medication Order Taking? Sig Documenting Provider Last Dose Status Informant  acetaminophen  (TYLENOL ) 325 MG tablet [id number] No Take 2 tablets (650 mg total) by mouth every 6 (six) hours as needed for mild pain (or Fever >/= 101). Aura Leeds [place], DO Taking Active            Med Note (MCLAUGHLIN, CONSTANCE S   Wed Feb 09, 2023  1:24 PM) As needed  allopurinol  (ZYLOPRIM ) 300 MG tablet [id number] No Take 1 tablet (300 mg total) by mouth daily. Nafziger, Randel Buss, NP Taking Active   apixaban  (ELIQUIS ) 5 MG TABS tablet [id number] No Take 1 tablet (5 mg total) by mouth 2 (two) times daily. Nafziger, Randel Buss, NP Taking Active   Calcium  Carb-Cholecalciferol  (CALCIUM  600 + D PO) [id number] No Take 1 tablet by mouth daily. 800 units of vitamin D  [provider] Taking Active Self  cyanocobalamin  (VITAMIN B12) 500 MCG tablet [id number] No Take 1 tablet by mouth 2 (two) times daily.  [provider] Taking Active   dicyclomine  (BENTYL ) 20 MG tablet [id number] No Take 0.5 tablets (10 mg total) by mouth 2 (two) times daily for 5 days. Russella Courts A, DO Taking Expired 08/13/23 2359   docusate sodium  (COLACE) 100 MG capsule [id number] No Take 100 mg by mouth 2 (two) times daily. [provider] Taking Active   esomeprazole  (NEXIUM ) 40 MG capsule [id number]  Take 1 capsule (40 mg total) by mouth 2 (two) times daily before a meal. Nafziger, Cory, NP  Active   Eszopiclone  3 MG TABS [id number] No Take 1 tablet (3 mg total) by mouth at bedtime. Take immediately before bedtime Nafziger, Randel Buss, NP Taking Active   glycerin  adult 2 g suppository [id number]  Place 1 suppository rectally as needed for constipation. Rosealee Concha, MD  Active   ipratropium (ATROVENT ) 0.03 % nasal spray [id number] No Place 2 sprays into both nostrils every 12 (twelve) hours.  Taking Active   lidocaine -prilocaine  (EMLA ) cream [id number] No Apply 1 Application topically as needed. Sumner Ends, MD Taking Active Self           Med Note [facility], CONSTANCE S   Wed Feb 09, 2023  1:24 PM) As needed  mirabegron  ER (MYRBETRIQ ) 50 MG TB24 tablet [id number] No Take 1 tablet by mouth daily. [provider] Taking Active   mirtazapine  (REMERON ) 15 MG tablet [id number] No Take 1 tablet (15 mg total) by mouth at bedtime. Sumner Ends, MD Taking Active   Multiple Vitamin (MULTIVITAMIN) tablet [id number] No Take 1 tablet by mouth in the morning. [provider] Taking Active Self  ondansetron  (ZOFRAN ) 4 MG tablet [id number]  Take 1 tablet (4 mg total) by mouth every 4 (four) hours as needed for nausea or vomiting. Nafziger, Randel Buss, NP  Active   oxybutynin  (DITROPAN ) 5 MG tablet [id number] No Take 2.5 mg by mouth 2 (two) times daily. [provider] Taking Active   polyethylene glycol powder (GLYCOLAX /MIRALAX ) 17 GM/SCOOP powder [id number]  Mix 4 capfuls in a 32 oz Gatorade, Can repeat the  next day for a satisfactory bowel movement. THEN one capful daily for maintenance Rosealee Concha, MD  Active   potassium chloride  (KLOR-CON  M) 10 MEQ tablet [id number] No Take 1 tablet (10 mEq total) by mouth 2 (two) times daily. Sumner Ends, MD Taking Active            Med Note Acie Acosta, Raynald Calkins   Fri Apr 15, 2023  9:03 AM) Only takes 10 meq daily  Respiratory Therapy Supplies (FLUTTER) DEVI [id number] No 1 each by Does not apply route in the morning and at bedtime. Joesph Mussel, DO Taking Active Self           Med Note [facility], CONSTANCE S   Wed Feb 09, 2023  1:25 PM) As needed  sodium chloride  HYPERTONIC 3 % nebulizer solution [id number] No Take by nebulization as needed for other. Antonio Baumgarten, NP Taking Active Self           Med Note [facility], CONSTANCE S   Wed Feb 09, 2023  1:24 PM) As needed  tamsulosin  (FLOMAX ) 0.4 MG CAPS capsule [id number] No Take 1 capsule by mouth daily. [provider] Taking Active   traZODone  (DESYREL ) 50 MG tablet [id number] No Take 1 tablet by mouth at bedtime as needed. [provider] Taking Active   valsartan -hydrochlorothiazide  (DIOVAN -HCT) 160-25 MG tablet [id number] No Take 1 tablet by mouth daily. Nafziger, Randel Buss, NP Taking Active             Home Care and Equipment/Supplies: Were Home Health Services Ordered?: NA Any new equipment or medical supplies ordered?: NA  Functional Questionnaire: Do you need assistance with bathing/showering or dressing?: No Do you need assistance with meal preparation?: No Do you need assistance with eating?: No Do you have difficulty maintaining continence: No Do you need assistance with getting out of bed/getting out of a chair/moving?: No Do you have difficulty managing or taking your medications?: No  Follow up appointments reviewed: PCP Follow-up appointment confirmed?: Yes Date of PCP follow-up appointment?: 08/18/23 Follow-up Provider: [facility] Follow-up  appointment confirmed?: No Reason Specialist Follow-Up Not Confirmed: Patient has Specialist Provider Number and will Call for Appointment Do you need transportation to your follow-up appointment?: No Do you understand care options if your condition(s) worsen?: Yes-patient verbalized understanding    SIGNATURE Darrall Ellison, LPN [facility] Nurse Health Advisor Direct Dial [phone]

## 2023-08-16 ENCOUNTER — Encounter: Payer: Medicare Other | Admitting: Family Medicine

## 2023-08-16 LAB — CUP PACEART REMOTE DEVICE CHECK
Date Time Interrogation Session: 20250211084440
Implantable Lead Connection Status: 753985
Implantable Lead Connection Status: 753985
Implantable Lead Implant Date: 20200129
Implantable Lead Implant Date: 20200129
Implantable Lead Location: 753859
Implantable Lead Location: 753860
Implantable Lead Model: 377
Implantable Lead Model: 377
Implantable Lead Serial Number: 80947537
Implantable Lead Serial Number: 80970966
Implantable Pulse Generator Implant Date: 20200129
Pulse Gen Model: 407145
Pulse Gen Serial Number: 69503797

## 2023-08-16 NOTE — Progress Notes (Signed)
error 

## 2023-08-17 NOTE — Progress Notes (Signed)
Location of Tumor / Histology: Pancreatic Ca  Marcus [name] presented as referral from Dr. Thornton Papas ([facility]).  08/08/2023 Dr. Tanda Rockers CT Abdomen Pelvis with Contrast CLINICAL DATA:  Left lower quadrant abdominal pain vomiting pancreatic cancer. * Tracking Code: BO *  IMPRESSION: 1. Stable appearance of the hypodense lesion within the pancreatic head neck junction consistent with known pancreatic adenocarcinoma. Progressive narrowing of the main portal vein adjacent to the lesion which now demonstrates near occlusion. 2. Cholelithiasis. Mild pericholecystic inflammatory stranding again identified, nonspecific. If there is clinical concern for acute cholecystitis, further evaluation with right upper quadrant sonography could be considered. 3. Extensive pneumobilia, suggesting patency of the indwelling palliative metallic extrahepatic biliary stent. Aortic Atherosclerosis (ICD10-I70.0).  The  08/05/2023 Lonna Cobb, NP MR Abdomen with/without Contrast CLINICAL DATA:  Pancreatic cancer assess treatment response, characterize liver lesions as cysts versus metastases.   IMPRESSION: 1. Unchanged appearance of the pancreas with an ill-defined, infiltrative mass of the pancreatic head encasing the adjacent central superior mesenteric vein and contacting the superior mesenteric artery. 2. Unchanged diffuse atrophy and cystic change of the distal pancreas, with distention of the pancreatic duct up to 1.0 cm. 3. Common bile duct stent, which appears patent, with unchanged mild intrahepatic biliary ductal dilatation and post stenting pneumobilia. 4. Multiple fluid signal cysts scattered throughout the liver, previously identified by prior CT. No solid lesion or suspicious contrast enhancement. No evidence of lymphadenopathy or distant metastatic disease in the abdomen. 5. Unchanged gallbladder wall thickening. Sludge and small gallstones in the gallbladder as well as a  small air-fluid level. Aortic Atherosclerosis (ICD10-I70.0).   07/01/2023 Lonna Cobb, NP CT Abdomen Pelvis with Contrast CLINICAL DATA:  Pancreatic cancer, assess treatment response * Tracking Code: BO *  IMPRESSION: 1. Essentially stable exam. No metastatic disease identified within the abdomen or pelvis. 2. Redemonstration of patient's known pancreatic head/neck mass with upstream main pancreatic duct dilation and moderate narrowing of the main portal vein at the formation. Please see above for details. 3. Multiple other nonacute observations, as described.   Past/Anticipated interventions by medical oncology, if any:  08/08/2023 Dr. Thornton Papas   Weight changes, if any: Wt. Loss 40 lbs in 8 months.  Bowel/Bladder complaints, if any:  Constipation is taking medication to help.  No Bladder issues at this time.  Nausea/Vomiting, if any: Nausea no vomiting due to narrowing of his esophagus per patient.  Swallowing difficulty especially with calcium pills.  Pain issues, if any:  1/10 discomfort abdomen  SAFETY ISSUES: Prior radiation?  Yes, brachytherapy ( prostate) Pacemaker/ICD? Yes Possible current pregnancy? Male Is the patient on methotrexate? No  Current Complaints / other details:

## 2023-08-18 ENCOUNTER — Other Ambulatory Visit: Payer: Self-pay | Admitting: Adult Health

## 2023-08-18 ENCOUNTER — Ambulatory Visit: Payer: Non-veteran care | Admitting: Adult Health

## 2023-08-18 ENCOUNTER — Encounter: Payer: Self-pay | Admitting: Adult Health

## 2023-08-18 ENCOUNTER — Other Ambulatory Visit (HOSPITAL_BASED_OUTPATIENT_CLINIC_OR_DEPARTMENT_OTHER): Payer: Self-pay

## 2023-08-18 VITALS — BP 120/80 | HR 67 | Temp 98.3°F | Ht 68.0 in | Wt 210.0 lb

## 2023-08-18 DIAGNOSIS — R1012 Left upper quadrant pain: Secondary | ICD-10-CM

## 2023-08-18 MED ORDER — OXYCODONE HCL 5 MG PO TABS
5.0000 mg | ORAL_TABLET | Freq: Four times a day (QID) | ORAL | 0 refills | Status: DC | PRN
  Filled 2023-08-18: qty 30, 8d supply, fill #0

## 2023-08-18 NOTE — Progress Notes (Signed)
Subjective:    Patient ID: Marcus [name], male    DOB: 10-16-1940, 83 y.o.   MRN: [id number]  HPI  83 year old male who  has a past medical history of Acute meniscal tear of knee (left), Arthritis, AV block (08/01/2018), Benign essential hypertension (01/18/2007), Bronchiectasis with acute exacerbation (06/23/2020), Chest pain, atypical (08/23/2014), Chronic cough (10/07/2014), Coronary artery disease, Degeneration of lumbar intervertebral disc (10/14/2017), Diverticulosis of colon (07/07/2005), Elevated PSA (06/19/2014), GERD (gastroesophageal reflux disease) (01/18/2007), Gout, unspecified (01/18/2007), Hemorrhoids (01/19/2011), Hiatal hernia, History of colonic polyps (01/18/2007), Insomnia, unspecified (08/21/2007), Iron deficiency anemia, unspecified  (01/19/2011), Lumbar post-laminectomy syndrome (10/14/2017), Lumbar spondylolysis, Lumbar strain (12/14/2011), Obstructive sleep apnea (01/18/2007), Paraesophageal hernia, Primary osteoarthritis of knee (05/23/2015), Prostate cancer (HCC) (2016), S/P knee replacement (05/23/2015), Sensorineural hearing loss, bilateral, Vitamin B12 deficiency, and Vitamin D deficiency (10/28/2009).  He presents to the office today for follow-up after being seen in the emergency room 5 days ago with a complaint of left upper abdominal pain and constipation.  In the ER his CT scan of abdomen and pelvis showed Stable appearance of the hypodense lesion in the pancreatic head with pancreatic ductal dilatation.   There is mild narrowing at the origin of the celiac artery due to calcified plaque. Remainder of the celiac artery is patent. Superior mesenteric artery and inferior mesenteric artery widely patent.   Hepatobiliary: Pneumobilia again noted. Metallic biliary stent is unchanged. Scattered cysts in the liver. No suspicious focal hepatic abnormality. Layering gallstones within the gallbladder. Mild surrounding pericholecystic inflammation again noted,  unchanged.   Stable changes from prior partial gastrectomy. No evidence of bowel obstruction.    CT Angio Chest did not show PE.   Today he reports that he is no longer having constipation but continues to get sudden " painful attacks" in his left upper abdomen. More times than not these attacks come after eating. Does not have right upper abdominal pain. Mild nausea a times.    Review of Systems See HPI   Past Medical History:  Diagnosis Date   Acute meniscal tear of knee left   Arthritis    generalized   AV block 08/01/2018   Benign essential hypertension 01/18/2007   Bronchiectasis with acute exacerbation 06/23/2020   Chest pain, atypical 08/23/2014   Chronic cough 10/07/2014   Coronary artery disease    Degeneration of lumbar intervertebral disc 10/14/2017   Diverticulosis of colon 07/07/2005   Elevated PSA 06/19/2014   GERD (gastroesophageal reflux disease) 01/18/2007   Gout, unspecified 01/18/2007   Hemorrhoids 01/19/2011   Hiatal hernia    History of colonic polyps 01/18/2007   Insomnia, unspecified 08/21/2007   Iron deficiency anemia, unspecified  01/19/2011   Lumbar post-laminectomy syndrome 10/14/2017   Lumbar spondylolysis    Lumbar strain 12/14/2011   Obstructive sleep apnea 01/18/2007   uses CPAP nightly   Paraesophageal hernia    large   Primary osteoarthritis of knee 05/23/2015   Prostate cancer (HCC) 2016   S/P knee replacement 05/23/2015   Sensorineural hearing loss, bilateral    Vitamin B12 deficiency    Vitamin D deficiency 10/28/2009    Social History   Socioeconomic History   Marital status: Widowed    Spouse name: Not on file   Number of children: 4   Years of education: 34   Highest education level: Some college, no degree  Occupational History   Occupation: retired    Associate Professor: [employer]  Tobacco Use   Smoking status: Former  Current packs/day: 0.00    Average packs/day: 2.0 packs/day for 20.0 years (40.0 ttl pk-yrs)     Types: Cigarettes    Start date: 07/05/1954    Quit date: 07/05/1974    Years since quitting: 49.1   Smokeless tobacco: Never  Vaping Use   Vaping status: Never Used  Substance and Sexual Activity   Alcohol use: Not Currently   Drug use: No   Sexual activity: Not Currently  Other Topics Concern   Not on file  Social History Narrative   Recently widowed   Former Smoker    Alcohol use-yes 1-2 drinks per day      Occupation: Retired Associate Professor      Originally from [place], [place] - in [place] > 10 yrs as of 2017      Social Drivers of Health   Financial Resource Strain: Low Risk  (07/28/2021)   Overall Financial Resource Strain (CARDIA)    Difficulty of Paying Living Expenses: Not hard at all  Food Insecurity: No Food Insecurity (01/14/2023)   Hunger Vital Sign    Worried About Running Out of Food in the Last Year: Never true    Ran Out of Food in the Last Year: Never true  Transportation Needs: No Transportation Needs (01/14/2023)   PRAPARE - Administrator, Civil Service (Medical): No    Lack of Transportation (Non-Medical): No  Physical Activity: Inactive (07/28/2021)   Exercise Vital Sign    Days of Exercise per Week: 0 days    Minutes of Exercise per Session: 0 min  Stress: No Stress Concern Present (07/28/2021)   [employer] of Occupational Health - Occupational Stress Questionnaire    Feeling of Stress : Not at all  Social Connections: Moderately Isolated (08/04/2020)   Social Connection and Isolation Panel [NHANES]    Frequency of Communication with Friends and Family: Three times a week    Frequency of Social Gatherings with Friends and Family: More than three times a week    Attends Religious Services: More than 4 times per year    Active Member of Clubs or Organizations: No    Attends Banker Meetings: Never    Marital Status: Widowed  Intimate Partner Violence: Not At Risk (01/10/2023)   Humiliation, Afraid, Rape, and Kick questionnaire    Fear of  Current or Ex-Partner: No    Emotionally Abused: No    Physically Abused: No    Sexually Abused: No    Past Surgical History:  Procedure Laterality Date   BILIARY BRUSHING  12/13/2022   Procedure: BILIARY BRUSHING;  Surgeon: Iva Boop, MD;  Location: [facility] ENDOSCOPY;  Service: Gastroenterology;;   BILIARY STENT PLACEMENT  12/13/2022   Procedure: BILIARY STENT PLACEMENT;  Surgeon: Iva Boop, MD;  Location: [facility] ENDOSCOPY;  Service: Gastroenterology;;   BILIARY STENT PLACEMENT N/A 02/07/2023   Procedure: BILIARY STENT PLACEMENT;  Surgeon: Lemar Lofty., MD;  Location: [facility] ENDOSCOPY;  Service: Gastroenterology;  Laterality: N/A;   BIOPSY  12/23/2022   Procedure: BIOPSY;  Surgeon: Lemar Lofty., MD;  Location: [facility] ENDOSCOPY;  Service: Gastroenterology;;   BRONCHIAL WASHINGS  10/30/2019   Procedure: BRONCHIAL WASHINGS;  Surgeon: [name], DO;  Location: [facility] ENDOSCOPY;  Service: Endoscopy;;   CARDIAC CATHETERIZATION     CATARACT EXTRACTION W/ INTRAOCULAR LENS  IMPLANT, BILATERAL Bilateral    COLONOSCOPY     ENDOSCOPIC RETROGRADE CHOLANGIOPANCREATOGRAPHY (ERCP) WITH PROPOFOL N/A 02/07/2023   Procedure: ENDOSCOPIC RETROGRADE CHOLANGIOPANCREATOGRAPHY (ERCP) WITH PROPOFOL;  Surgeon: Lemar Lofty., MD;  Location: [facility];  Service: Gastroenterology;  Laterality: N/A;  Stent change   ERCP N/A 12/13/2022   Procedure: ENDOSCOPIC RETROGRADE CHOLANGIOPANCREATOGRAPHY (ERCP);  Surgeon: Iva Boop, MD;  Location: [facility] ENDOSCOPY;  Service: Gastroenterology;  Laterality: N/A;  with stent placement   ESOPHAGOGASTRODUODENOSCOPY     ESOPHAGOGASTRODUODENOSCOPY (EGD) WITH PROPOFOL N/A 12/23/2022   Procedure: ESOPHAGOGASTRODUODENOSCOPY (EGD) WITH PROPOFOL;  Surgeon: Meridee Score Netty Starring., MD;  Location: [facility] ENDOSCOPY;  Service: Gastroenterology;  Laterality: N/A;   EUS N/A 12/23/2022   Procedure: UPPER ENDOSCOPIC ULTRASOUND (EUS) RADIAL;  Surgeon: Lemar Lofty., MD;   Location: [facility] ENDOSCOPY;  Service: Gastroenterology;  Laterality: N/A;   FINE NEEDLE ASPIRATION  12/23/2022   Procedure: FINE NEEDLE ASPIRATION (FNA) RADIAL;  Surgeon: Lemar Lofty., MD;  Location: [facility];  Service: Gastroenterology;;   IR IMAGING GUIDED PORT INSERTION  01/04/2023   KNEE ARTHROSCOPY Right 2007   KNEE ARTHROSCOPY  07/13/2011   Procedure: ARTHROSCOPY KNEE;  Surgeon: Erasmo Leventhal;  Location: [facility];  Service: Orthopedics;  Laterality: Left;  partial menisectomy with chondrylplasty   KNEE SURGERY Left    LAMINECTOMY AND MICRODISCECTOMY LUMBAR SPINE  MARCH  2008   L3 -  4   LAPAROSCOPIC INCISIONAL / UMBILICAL / VENTRAL HERNIA REPAIR  2006   PACEMAKER IMPLANT N/A 08/02/2018   Procedure: PACEMAKER IMPLANT;  Surgeon: Marinus Maw, MD;  Location: [facility];  Service: Cardiovascular;  Laterality: N/A;   PROSTATE BIOPSY     RADIOACTIVE SEED IMPLANT N/A 02/12/2019   Procedure: RADIOACTIVE SEED IMPLANT/BRACHYTHERAPY IMPLANT;  Surgeon: Marcine Matar, MD;  Location: [facility];  Service: Urology;  Laterality: N/A;  90 MINS   REMOVAL OF STONES  02/07/2023   Procedure: REMOVAL OF Sludge;  Surgeon: Meridee Score Netty Starring., MD;  Location: [facility];  Service: Gastroenterology;;   SHOULDER ARTHROSCOPY Right 05-23-2007   SIGMOID COLECTOMY FOR CANCER  1989   SPACE OAR INSTILLATION N/A 02/12/2019   Procedure: SPACE OAR INSTILLATION;  Surgeon: Marcine Matar, MD;  Location: [facility];  Service: Urology;  Laterality: N/A;   SPHINCTEROTOMY  12/13/2022   Procedure: SPHINCTEROTOMY;  Surgeon: Iva Boop, MD;  Location: [facility] ENDOSCOPY;  Service: Gastroenterology;;   Francine Graven REMOVAL  02/07/2023   Procedure: STENT REMOVAL;  Surgeon: Lemar Lofty., MD;  Location: [facility];  Service: Gastroenterology;;   TOTAL KNEE ARTHROPLASTY Left 05/23/2015   Procedure: LEFT TOTAL KNEE ARTHROPLASTY;  Surgeon: Eugenia Mcalpine, MD;  Location: [facility];  Service:  Orthopedics;  Laterality: Left;   VERTICAL BANDED GASTROPLASTY  1986   VIDEO BRONCHOSCOPY N/A 10/30/2019   Procedure: VIDEO BRONCHOSCOPY WITH BRONICAL ALVEROLAR LAVAGE WITHOUT FLUORO;  Surgeon: [name], DO;  Location: [facility]COPY;  Service: Endoscopy;  Laterality: N/A;    Family History  Problem Relation Age of Onset   Stroke Paternal Grandfather    Heart disease Paternal Grandfather    Esophagitis Father        died from perforated esophagus   Breast cancer Mother    Colon cancer Maternal Grandmother        questionable   Esophageal cancer Neg Hx    Prostate cancer Neg Hx    Stomach cancer Neg Hx     No Known Allergies  Current Outpatient Medications on File Prior to Visit  Medication Sig Dispense Refill   acetaminophen (TYLENOL) 325 MG tablet Take 2 tablets (650 mg total) by mouth every 6 (six) hours as needed for  mild pain (or Fever >/= 101). 20 tablet 0   allopurinol (ZYLOPRIM) 300 MG tablet Take 1 tablet (300 mg total) by mouth daily. 90 tablet 3   apixaban (ELIQUIS) 5 MG TABS tablet Take 1 tablet (5 mg total) by mouth 2 (two) times daily. 180 tablet 1   Calcium Carb-Cholecalciferol (CALCIUM 600 + D PO) Take 1 tablet by mouth daily. 800 units of vitamin D     cyanocobalamin (VITAMIN B12) 500 MCG tablet Take 1 tablet by mouth 2 (two) times daily.     docusate sodium (COLACE) 100 MG capsule Take 100 mg by mouth 2 (two) times daily.     esomeprazole (NEXIUM) 40 MG capsule Take 1 capsule (40 mg total) by mouth 2 (two) times daily before a meal. 60 capsule 12   Eszopiclone 3 MG TABS Take 1 tablet (3 mg total) by mouth at bedtime. Take immediately before bedtime 30 tablet 2   glycerin adult 2 g suppository Place 1 suppository rectally as needed for constipation. 12 suppository 0   ipratropium (ATROVENT) 0.03 % nasal spray Place 2 sprays into both nostrils every 12 (twelve) hours. 30 mL 1   lidocaine-prilocaine (EMLA) cream Apply 1 Application topically as needed. 30 g 0    mirabegron ER (MYRBETRIQ) 50 MG TB24 tablet Take 1 tablet by mouth daily.     mirtazapine (REMERON) 15 MG tablet Take 1 tablet (15 mg total) by mouth at bedtime. 30 tablet 2   Multiple Vitamin (MULTIVITAMIN) tablet Take 1 tablet by mouth in the morning.     ondansetron (ZOFRAN) 4 MG tablet Take 1 tablet (4 mg total) by mouth every 4 (four) hours as needed for nausea or vomiting. 12 tablet 0   oxybutynin (DITROPAN) 5 MG tablet Take 2.5 mg by mouth 2 (two) times daily.     polyethylene glycol powder (GLYCOLAX/MIRALAX) 17 GM/SCOOP powder Mix 4 capfuls in a 32 oz Gatorade, Can repeat the next day for a satisfactory bowel movement. THEN one capful daily for maintenance 238 g 0   Respiratory Therapy Supplies (FLUTTER) DEVI 1 each by Does not apply route in the morning and at bedtime. 1 each 0   sodium chloride HYPERTONIC 3 % nebulizer solution Take by nebulization as needed for other. 750 mL 1   tamsulosin (FLOMAX) 0.4 MG CAPS capsule Take 1 capsule by mouth daily.     traZODone (DESYREL) 50 MG tablet Take 1 tablet by mouth at bedtime as needed.     No current facility-administered medications on file prior to visit.    BP 120/80   Pulse 67   Temp 98.3 F (36.8 C) (Oral)   Ht 5\' 8"  (1.727 m)   Wt 210 lb (95.3 kg)   SpO2 98%   BMI 31.93 kg/m       Objective:   Physical Exam Vitals and nursing note reviewed.  Cardiovascular:     Rate and Rhythm: Normal rate and regular rhythm.     Pulses: Normal pulses.     Heart sounds: Normal heart sounds.  Pulmonary:     Effort: Pulmonary effort is normal.     Breath sounds: Normal breath sounds.  Abdominal:     General: Bowel sounds are normal. There is distension.     Palpations: Abdomen is soft.     Tenderness: There is no abdominal tenderness.  Skin:    General: Skin is warm and dry.  Neurological:     General: No focal deficit present.     Mental  Status: He is oriented to person, place, and time.  Psychiatric:        Mood and Affect:  Mood normal.        Behavior: Behavior normal.        Thought Content: Thought content normal.        Judgment: Judgment normal.       Assessment & Plan:  1. Left upper quadrant abdominal pain (Primary) - Advised that he is either having referred pain from his gallbladder or the pain is coming from pancreatic cancer. He is interested in speaking to someone about gallbladder surgery but is going to think about it before deciding. He plans to go to the [name] and speak with his medical people there as well. He will let me know what he decides. Advised not eating foods high in fat or fast food for the time being   [name], NP  Time spent with patient today was 31 minutes which consisted of chart review, discussing abdominal pain, work up, treatment answering questions and documentation.

## 2023-08-19 ENCOUNTER — Other Ambulatory Visit: Payer: Self-pay

## 2023-08-19 ENCOUNTER — Telehealth: Payer: Self-pay | Admitting: Adult Health

## 2023-08-19 ENCOUNTER — Other Ambulatory Visit (HOSPITAL_BASED_OUTPATIENT_CLINIC_OR_DEPARTMENT_OTHER): Payer: Self-pay

## 2023-08-19 NOTE — Telephone Encounter (Signed)
Jamie from [place] called to check on patients follow up for CPAP compliance. Patient canceled appointment and did not reschedule. Left voicemail for patient to call back to reschedule appointment

## 2023-08-19 NOTE — Telephone Encounter (Signed)
Okay for refill?

## 2023-08-20 ENCOUNTER — Other Ambulatory Visit: Payer: Self-pay | Admitting: Oncology

## 2023-08-21 ENCOUNTER — Encounter: Payer: Self-pay | Admitting: Internal Medicine

## 2023-08-22 ENCOUNTER — Ambulatory Visit
Admission: RE | Admit: 2023-08-22 | Discharge: 2023-08-22 | Disposition: A | Discharge status: Home / Self Care | Payer: Non-veteran care | Source: Ambulatory Visit | Attending: Radiation Oncology | Admitting: Radiation Oncology

## 2023-08-22 ENCOUNTER — Other Ambulatory Visit: Payer: No Typology Code available for payment source

## 2023-08-22 ENCOUNTER — Ambulatory Visit: Payer: No Typology Code available for payment source

## 2023-08-22 ENCOUNTER — Ambulatory Visit: Payer: No Typology Code available for payment source | Admitting: Oncology

## 2023-08-22 ENCOUNTER — Encounter: Payer: Self-pay | Admitting: Radiation Oncology

## 2023-08-22 VITALS — BP 123/65 | HR 61 | Temp 97.3°F | Resp 18 | Ht 68.0 in | Wt 217.4 lb

## 2023-08-22 DIAGNOSIS — C258 Malignant neoplasm of overlapping sites of pancreas: Secondary | ICD-10-CM | POA: Insufficient documentation

## 2023-08-22 NOTE — Progress Notes (Signed)
Radiation Oncology         ([phone]) [phone] ________________________________  Initial outpatient Consultation  Name: Marcus [name] MRN: [id number]  Date of Service: 08/22/2023 DOB: [birth year]  NW:GNFAOZHY, Kandee Keen, NP  [name], MD   REFERRING PHYSICIAN: [name], MD  DIAGNOSIS: 83 y/o man with locally advanced pancreatic cancer    ICD-10-CM   1. Malignant neoplasm of overlapping sites of pancreas (HCC)  C25.8       HISTORY OF PRESENT ILLNESS: Marcus [name] is a 83 y.o. male seen at the request of Dr. Truett Perna.  He is well-known to our service, having previously been treated with brachytherapy on 02/12/2019 for Gleason 3+4 adenocarcinoma of the prostate.  His PSA nadired undetectable and has remained very low, at 0.04 when last checked with urology in May 2023 and he has continued to follow the PSA with his PCP since that time.  More recently, he presented to his PCP in 11/2022 with unintended weight loss and was found to have elevated liver enzymes. He underwent CT A/P on 12/03/22 showing a suspected pancreatic mass and indeterminate liver lesions. He underwent ERCP on 12/13/22 for stent placement, followed by EUS on 12/23/22. Aspiration obtained during EUS of the pancreatic head mass confirmed adenocarcinoma. He was subsequently referred to Dr. Truett Perna and was started on gemcitabine and abraxane on 01/07/23. Treatment has been on hold following cycle 12 on 06/24/23. He has undergone work up for indeterminate liver lesions with MRI of the liver on 08/05/23 confirming multiple cysts in the liver and no evidence of metastatic disease.  A recent CT A/P performed during ER evaluation of severe abdominal pain, N/V on 08/08/23 showed a stable appearance of the pancreatic head lesion but progressive narrowing of the main portal vein adjacent to the lesion, which now demonstrates near-occlusion.  Fortunately, the abdominal pain, nausea and vomiting resolved with Zofran/morphine and he was able to  discharge home the same day.  He is not felt to be a surgical candidate given his advanced age, medical comorbidities and vascular involvement of the lesion.  Therefore, he has been kindly referred to [place] today to discuss the potential role of radiotherapy in the management of his locally advanced pancreatic cancer.  He is accompanied by his daughter, Marcus [name], for today's visit.  PREVIOUS RADIATION THERAPY: No  PAST MEDICAL HISTORY:  Past Medical History:  Diagnosis Date   Acute meniscal tear of knee left   Arthritis    generalized   AV block 08/01/2018   Benign essential hypertension 01/18/2007   Bronchiectasis with acute exacerbation 06/23/2020   Chest pain, atypical 08/23/2014   Chronic cough 10/07/2014   Coronary artery disease    Degeneration of lumbar intervertebral disc 10/14/2017   Diverticulosis of colon 07/07/2005   Elevated PSA 06/19/2014   GERD (gastroesophageal reflux disease) 01/18/2007   Gout, unspecified 01/18/2007   Hemorrhoids 01/19/2011   Hiatal hernia    History of colonic polyps 01/18/2007   Insomnia, unspecified 08/21/2007   Iron deficiency anemia, unspecified  01/19/2011   Lumbar post-laminectomy syndrome 10/14/2017   Lumbar spondylolysis    Lumbar strain 12/14/2011   Obstructive sleep apnea 01/18/2007   uses CPAP nightly   Paraesophageal hernia    large   Primary osteoarthritis of knee 05/23/2015   Prostate cancer (HCC) 2016   S/P knee replacement 05/23/2015   Sensorineural hearing loss, bilateral    Vitamin B12 deficiency    Vitamin D deficiency 10/28/2009      PAST SURGICAL HISTORY:  Past Surgical History:  Procedure Laterality Date   BILIARY BRUSHING  12/13/2022   Procedure: BILIARY BRUSHING;  Surgeon: Iva Boop, MD;  Location: [facility] ENDOSCOPY;  Service: Gastroenterology;;   BILIARY STENT PLACEMENT  12/13/2022   Procedure: BILIARY STENT PLACEMENT;  Surgeon: Iva Boop, MD;  Location: [facility] ENDOSCOPY;  Service: Gastroenterology;;   BILIARY  STENT PLACEMENT N/A 02/07/2023   Procedure: BILIARY STENT PLACEMENT;  Surgeon: Lemar Lofty., MD;  Location: [facility] ENDOSCOPY;  Service: Gastroenterology;  Laterality: N/A;   BIOPSY  12/23/2022   Procedure: BIOPSY;  Surgeon: Lemar Lofty., MD;  Location: [facility] ENDOSCOPY;  Service: Gastroenterology;;   BRONCHIAL WASHINGS  10/30/2019   Procedure: BRONCHIAL WASHINGS;  Surgeon: [name], DO;  Location: [facility] ENDOSCOPY;  Service: Endoscopy;;   CARDIAC CATHETERIZATION     CATARACT EXTRACTION W/ INTRAOCULAR LENS  IMPLANT, BILATERAL Bilateral    COLONOSCOPY     ENDOSCOPIC RETROGRADE CHOLANGIOPANCREATOGRAPHY (ERCP) WITH PROPOFOL N/A 02/07/2023   Procedure: ENDOSCOPIC RETROGRADE CHOLANGIOPANCREATOGRAPHY (ERCP) WITH PROPOFOL;  Surgeon: Lemar Lofty., MD;  Location: [facility] ENDOSCOPY;  Service: Gastroenterology;  Laterality: N/A;  Stent change   ERCP N/A 12/13/2022   Procedure: ENDOSCOPIC RETROGRADE CHOLANGIOPANCREATOGRAPHY (ERCP);  Surgeon: Iva Boop, MD;  Location: [facility] ENDOSCOPY;  Service: Gastroenterology;  Laterality: N/A;  with stent placement   ESOPHAGOGASTRODUODENOSCOPY     ESOPHAGOGASTRODUODENOSCOPY (EGD) WITH PROPOFOL N/A 12/23/2022   Procedure: ESOPHAGOGASTRODUODENOSCOPY (EGD) WITH PROPOFOL;  Surgeon: Meridee Score Netty Starring., MD;  Location: [facility] ENDOSCOPY;  Service: Gastroenterology;  Laterality: N/A;   EUS N/A 12/23/2022   Procedure: UPPER ENDOSCOPIC ULTRASOUND (EUS) RADIAL;  Surgeon: Lemar Lofty., MD;  Location: [facility] ENDOSCOPY;  Service: Gastroenterology;  Laterality: N/A;   FINE NEEDLE ASPIRATION  12/23/2022   Procedure: FINE NEEDLE ASPIRATION (FNA) RADIAL;  Surgeon: Lemar Lofty., MD;  Location: [facility] ENDOSCOPY;  Service: Gastroenterology;;   IR IMAGING GUIDED PORT INSERTION  01/04/2023   KNEE ARTHROSCOPY Right 2007   KNEE ARTHROSCOPY  07/13/2011   Procedure: ARTHROSCOPY KNEE;  Surgeon: Erasmo Leventhal;  Location: [facility];  Service:  Orthopedics;  Laterality: Left;  partial menisectomy with chondrylplasty   KNEE SURGERY Left    LAMINECTOMY AND MICRODISCECTOMY LUMBAR SPINE  MARCH  2008   L3 -  4   LAPAROSCOPIC INCISIONAL / UMBILICAL / VENTRAL HERNIA REPAIR  2006   PACEMAKER IMPLANT N/A 08/02/2018   Procedure: PACEMAKER IMPLANT;  Surgeon: Marinus Maw, MD;  Location: [facility] INVASIVE CV LAB;  Service: Cardiovascular;  Laterality: N/A;   PROSTATE BIOPSY     RADIOACTIVE SEED IMPLANT N/A 02/12/2019   Procedure: RADIOACTIVE SEED IMPLANT/BRACHYTHERAPY IMPLANT;  Surgeon: Marcine Matar, MD;  Location: [facility] ORS;  Service: Urology;  Laterality: N/A;  90 MINS   REMOVAL OF STONES  02/07/2023   Procedure: REMOVAL OF Sludge;  Surgeon: Meridee Score Netty Starring., MD;  Location: [facility] ENDOSCOPY;  Service: Gastroenterology;;   SHOULDER ARTHROSCOPY Right 05-23-2007   SIGMOID COLECTOMY FOR CANCER  1989   SPACE OAR INSTILLATION N/A 02/12/2019   Procedure: SPACE OAR INSTILLATION;  Surgeon: Marcine Matar, MD;  Location: [facility] ORS;  Service: Urology;  Laterality: N/A;   SPHINCTEROTOMY  12/13/2022   Procedure: SPHINCTEROTOMY;  Surgeon: Iva Boop, MD;  Location: [facility] ENDOSCOPY;  Service: Gastroenterology;;   Francine Graven REMOVAL  02/07/2023   Procedure: STENT REMOVAL;  Surgeon: Lemar Lofty., MD;  Location: [facility] ENDOSCOPY;  Service: Gastroenterology;;   TOTAL KNEE ARTHROPLASTY Left 05/23/2015   Procedure: LEFT TOTAL KNEE ARTHROPLASTY;  Surgeon:  Eugenia Mcalpine, MD;  Location: [facility] ORS;  Service: Orthopedics;  Laterality: Left;   VERTICAL BANDED GASTROPLASTY  1986   VIDEO BRONCHOSCOPY N/A 10/30/2019   Procedure: VIDEO BRONCHOSCOPY WITH BRONICAL ALVEROLAR LAVAGE WITHOUT FLUORO;  Surgeon: [name], DO;  Location: [facility] ENDOSCOPY;  Service: Endoscopy;  Laterality: N/A;    FAMILY HISTORY:  Family History  Problem Relation Age of Onset   Stroke Paternal Grandfather    Heart disease Paternal Grandfather    Esophagitis Father        died from perforated  esophagus   Breast cancer Mother    Colon cancer Maternal Grandmother        questionable   Esophageal cancer Neg Hx    Prostate cancer Neg Hx    Stomach cancer Neg Hx     SOCIAL HISTORY:  Social History   Socioeconomic History   Marital status: Widowed    Spouse name: Not on file   Number of children: 4   Years of education: 70   Highest education level: Some college, no degree  Occupational History   Occupation: retired    Associate Professor: [employer]  Tobacco Use   Smoking status: Former    Current packs/day: 0.00    Average packs/day: 2.0 packs/day for 20.0 years (40.0 ttl pk-yrs)    Types: Cigarettes    Start date: 07/05/1954    Quit date: 07/05/1974    Years since quitting: 49.1   Smokeless tobacco: Never  Vaping Use   Vaping status: Never Used  Substance and Sexual Activity   Alcohol use: Yes    Comment: wine   Drug use: No   Sexual activity: Not Currently  Other Topics Concern   Not on file  Social History Narrative   Recently widowed   Former Smoker    Alcohol use-yes 1-2 drinks per day      Occupation: Retired Associate Professor      Originally from [place], [place] - in [place] > 10 yrs as of 2017      Social Drivers of Health   Financial Resource Strain: Low Risk  (07/28/2021)   Overall Financial Resource Strain (CARDIA)    Difficulty of Paying Living Expenses: Not hard at all  Food Insecurity: No Food Insecurity (08/22/2023)   Hunger Vital Sign    Worried About Running Out of Food in the Last Year: Never true    Ran Out of Food in the Last Year: Never true  Transportation Needs: No Transportation Needs (08/22/2023)   PRAPARE - Administrator, Civil Service (Medical): No    Lack of Transportation (Non-Medical): No  Physical Activity: Inactive (07/28/2021)   Exercise Vital Sign    Days of Exercise per Week: 0 days    Minutes of Exercise per Session: 0 min  Stress: No Stress Concern Present (07/28/2021)   [employer] of Occupational Health -  Occupational Stress Questionnaire    Feeling of Stress : Not at all  Social Connections: Moderately Isolated (08/04/2020)   Social Connection and Isolation Panel [NHANES]    Frequency of Communication with Friends and Family: Three times a week    Frequency of Social Gatherings with Friends and Family: More than three times a week    Attends Religious Services: More than 4 times per year    Active Member of Clubs or Organizations: No    Attends Banker Meetings: Never    Marital Status: Widowed  Intimate Partner Violence: Not At Risk (08/22/2023)  Humiliation, Afraid, Rape, and Kick questionnaire    Fear of Current or Ex-Partner: No    Emotionally Abused: No    Physically Abused: No    Sexually Abused: No    ALLERGIES: Patient has no known allergies.  MEDICATIONS:  Current Outpatient Medications  Medication Sig Dispense Refill   amLODipine (NORVASC) 10 MG tablet Take 10 mg by mouth daily.     fluticasone (FLONASE) 50 MCG/ACT nasal spray Place 2 sprays into both nostrils daily.     Potassium Gluconate 550 MG TABS Take 550 mg by mouth daily.     rosuvastatin (CRESTOR) 20 MG tablet Take 20 mg by mouth daily.     acetaminophen (TYLENOL) 325 MG tablet Take 2 tablets (650 mg total) by mouth every 6 (six) hours as needed for mild pain (or Fever >/= 101). 20 tablet 0   allopurinol (ZYLOPRIM) 300 MG tablet Take 1 tablet (300 mg total) by mouth daily. 90 tablet 3   apixaban (ELIQUIS) 5 MG TABS tablet Take 1 tablet (5 mg total) by mouth 2 (two) times daily. 180 tablet 1   Calcium Carb-Cholecalciferol (CALCIUM 600 + D PO) Take 1 tablet by mouth daily. 800 units of vitamin D     cyanocobalamin (VITAMIN B12) 500 MCG tablet Take 1 tablet by mouth 2 (two) times daily.     docusate sodium (COLACE) 100 MG capsule Take 100 mg by mouth 2 (two) times daily.     esomeprazole (NEXIUM) 40 MG capsule Take 1 capsule (40 mg total) by mouth 2 (two) times daily before a meal. 60 capsule 12    Eszopiclone 3 MG TABS Take 1 tablet (3 mg total) by mouth at bedtime. Take immediately before bedtime 30 tablet 2   glycerin adult 2 g suppository Place 1 suppository rectally as needed for constipation. 12 suppository 0   ipratropium (ATROVENT) 0.03 % nasal spray Place 2 sprays into both nostrils every 12 (twelve) hours. 30 mL 1   lidocaine-prilocaine (EMLA) cream Apply 1 Application topically as needed. 30 g 0   mirabegron ER (MYRBETRIQ) 50 MG TB24 tablet Take 1 tablet by mouth daily.     mirtazapine (REMERON) 15 MG tablet Take 1 tablet (15 mg total) by mouth at bedtime. 30 tablet 2   Multiple Vitamin (MULTIVITAMIN) tablet Take 1 tablet by mouth in the morning.     ondansetron (ZOFRAN) 4 MG tablet Take 1 tablet (4 mg total) by mouth every 4 (four) hours as needed for nausea or vomiting. 12 tablet 0   oxybutynin (DITROPAN) 5 MG tablet Take 2.5 mg by mouth 2 (two) times daily.     oxyCODONE (ROXICODONE) 5 MG immediate release tablet Take 1 tablet (5 mg total) by mouth every 6 (six) hours as needed for severe pain (pain score 7-10). 30 tablet 0   polyethylene glycol powder (GLYCOLAX/MIRALAX) 17 GM/SCOOP powder Mix 4 capfuls in a 32 oz Gatorade, Can repeat the next day for a satisfactory bowel movement. THEN one capful daily for maintenance 238 g 0   Respiratory Therapy Supplies (FLUTTER) DEVI 1 each by Does not apply route in the morning and at bedtime. 1 each 0   sodium chloride HYPERTONIC 3 % nebulizer solution Take by nebulization as needed for other. 750 mL 1   tamsulosin (FLOMAX) 0.4 MG CAPS capsule Take 1 capsule by mouth daily.     traZODone (DESYREL) 50 MG tablet Take 1 tablet by mouth at bedtime as needed.     No current facility-administered medications for  this encounter.    REVIEW OF SYSTEMS:  On review of systems, the patient reports that he is doing well overall. He denies any chest pain, shortness of breath, cough, fevers, chills, night sweats, unintended weight changes. He denies  any bowel or bladder disturbances, and denies abdominal pain, nausea or vomiting. He denies any new musculoskeletal or joint aches or pains.  A complete review of systems is obtained and is otherwise negative.    PHYSICAL EXAM:  Wt Readings from Last 3 Encounters:  08/22/23 217 lb 6 oz (98.6 kg)  08/18/23 210 lb (95.3 kg)  08/13/23 210 lb (95.3 kg)   Temp Readings from Last 3 Encounters:  08/22/23 (!) 97.3 F (36.3 C) (Temporal)  08/18/23 98.3 F (36.8 C) (Oral)  08/14/23 98 F (36.7 C) (Oral)   BP Readings from Last 3 Encounters:  08/22/23 123/65  08/18/23 120/80  08/14/23 (!) 155/73   Pulse Readings from Last 3 Encounters:  08/22/23 61  08/18/23 67  08/14/23 61   Pain Assessment Pain Score: 1  Pain Loc: Abdomen/10  In general this is a well appearing Caucasian man in no acute distress. He's alert and oriented x4 and appropriate throughout the examination. Cardiopulmonary assessment is negative for acute distress and he exhibits normal effort.     KPS = 100  100 - Normal; no complaints; no evidence of disease. 90   - Able to carry on normal activity; minor signs or symptoms of disease. 80   - Normal activity with effort; some signs or symptoms of disease. 31   - Cares for self; unable to carry on normal activity or to do active work. 60   - Requires occasional assistance, but is able to care for most of his personal needs. 50   - Requires considerable assistance and frequent medical care. 40   - Disabled; requires special care and assistance. 30   - Severely disabled; hospital admission is indicated although death not imminent. 20   - Very sick; hospital admission necessary; active supportive treatment necessary. 10   - Moribund; fatal processes progressing rapidly. 0     - Dead  Karnofsky DA, Abelmann WH, Craver LS and Burchenal [facility] [phone]) The use of the nitrogen mustards in the palliative treatment of carcinoma: with particular reference to bronchogenic carcinoma  Cancer 1 634-56  LABORATORY DATA:  Lab Results  Component Value Date   WBC 4.7 08/13/2023   HGB 10.3 (L) 08/13/2023   HCT 32.7 (L) 08/13/2023   MCV 87.0 08/13/2023   PLT 191 08/13/2023   Lab Results  Component Value Date   NA 138 08/13/2023   K 3.5 08/13/2023   CL 104 08/13/2023   CO2 24 08/13/2023   Lab Results  Component Value Date   ALT 15 08/13/2023   AST 18 08/13/2023   ALKPHOS 95 08/13/2023   BILITOT 0.4 08/13/2023     RADIOGRAPHY: CUP PACEART REMOTE DEVICE CHECK Result Date: 08/16/2023 Scheduled remote reviewed. Normal device function.  Next remote 91 days. [place], CVRS  CT ABDOMEN PELVIS W CONTRAST Addendum Date: 08/14/2023 ADDENDUM REPORT: 08/14/2023 01:56 ADDENDUM: Vascular. There is mild narrowing at the origin of the celiac artery due to calcified plaque. Remainder of the celiac artery is patent. Superior mesenteric artery and inferior mesenteric artery widely patent. Electronically Signed   By: Charlett Nose M.D.   On: 08/14/2023 01:56   Result Date: 08/14/2023 CLINICAL DATA:  Left upper quadrant pain EXAM: CT ABDOMEN AND PELVIS WITH CONTRAST TECHNIQUE: Multidetector CT  imaging of the abdomen and pelvis was performed using the standard protocol following bolus administration of intravenous contrast. RADIATION DOSE REDUCTION: This exam was performed according to the departmental dose-optimization program which includes automated exposure control, adjustment of the mA and/or kV according to patient size and/or use of iterative reconstruction technique. CONTRAST:  OMNIPAQUE IOHEXOL 350 MG/ML SOLN COMPARISON:  08/08/2023 FINDINGS: Lower chest: Bibasilar atelectasis, left greater than right. Pacer wires in the right heart. Hepatobiliary: Pneumobilia again noted. Metallic biliary stent is unchanged. Scattered cysts in the liver. No suspicious focal hepatic abnormality. Layering gallstones within the gallbladder. Mild surrounding pericholecystic inflammation again noted,  unchanged. Pancreas: Again seen is the hypodense lesion in the pancreatic head/neck region, unchanged. Pancreatic ductal dilatation in the body and tail stable. No change since prior study. Spleen: No focal abnormality.  Normal size. Adrenals/Urinary Tract: No suspicious renal or adrenal abnormality. No stones or hydronephrosis. Urinary bladder unremarkable. Stomach/Bowel: Stable postoperative changes from partial gastrectomy. No bowel obstruction. Vascular/Lymphatic: Aortic atherosclerosis. No evidence of aneurysm or adenopathy. Reproductive: Radiation seeds in the prostate. Other: No free fluid or free air. Musculoskeletal: No acute bony abnormality. Degenerative changes in the lumbar spine. IMPRESSION: Stable appearance of the hypodense lesion in the pancreatic head with pancreatic ductal dilatation. Cholelithiasis.  Stable pericholecystic inflammation. Biliary stent with pneumobilia unchanged. Stable changes from prior partial gastrectomy. No evidence of bowel obstruction. Aortic atherosclerosis. Electronically Signed: By: Charlett Nose M.D. On: 08/14/2023 01:08   CT Angio Chest PE W and/or Wo Contrast Result Date: 08/14/2023 CLINICAL DATA:  Left upper quadrant pain EXAM: CT ANGIOGRAPHY CHEST WITH CONTRAST TECHNIQUE: Multidetector CT imaging of the chest was performed using the standard protocol during bolus administration of intravenous contrast. Multiplanar CT image reconstructions and MIPs were obtained to evaluate the vascular anatomy. RADIATION DOSE REDUCTION: This exam was performed according to the departmental dose-optimization program which includes automated exposure control, adjustment of the mA and/or kV according to patient size and/or use of iterative reconstruction technique. CONTRAST:  OMNIPAQUE IOHEXOL 350 MG/ML SOLN COMPARISON:  01/09/2023 FINDINGS: Cardiovascular: No filling defects in the pulmonary arteries to suggest pulmonary emboli. Cardiomegaly. Pacer wires in the right heart.  Coronary artery and aortic atherosclerosis. Mediastinum/Nodes: No mediastinal, hilar, or axillary adenopathy. Trachea and esophagus are unremarkable. Thyroid unremarkable. Lungs/Pleura: Bibasilar atelectasis, left greater than right. No effusions or suspicious pulmonary nodules. Upper Abdomen: See abdominal CT report. Musculoskeletal: No acute bony abnormality. Chest wall soft tissues are unremarkable. Review of the MIP images confirms the above findings. IMPRESSION: No evidence of pulmonary embolus. Cardiomegaly, coronary artery disease. Bibasilar atelectasis, left greater than right. Aortic Atherosclerosis (ICD10-I70.0). Electronically Signed   By: Charlett Nose M.D.   On: 08/14/2023 01:03   CT ABDOMEN PELVIS W CONTRAST Result Date: 08/08/2023 CLINICAL DATA:  Left lower quadrant abdominal pain vomiting pancreatic cancer. * Tracking Code: BO * EXAM: CT ABDOMEN AND PELVIS WITH CONTRAST TECHNIQUE: Multidetector CT imaging of the abdomen and pelvis was performed using the standard protocol following bolus administration of intravenous contrast. RADIATION DOSE REDUCTION: This exam was performed according to the departmental dose-optimization program which includes automated exposure control, adjustment of the mA and/or kV according to patient size and/or use of iterative reconstruction technique. CONTRAST:  OMNIPAQUE IOHEXOL 300 MG/ML  SOLN COMPARISON:  07/01/2023 FINDINGS: Lower chest: No acute abnormality. Hepatobiliary: Scattered cysts are again seen throughout the liver. No enhancing intrahepatic mass. Pneumobilia again identified within the nondependent left lobe suggesting patency the indwelling palliative metallic extrahepatic biliary  stent. Cholelithiasis again noted. Punctate foci of non dependent gas within the gallbladder lumen suggests patency of the cystic duct. Mild pericholecystic inflammatory stranding is again identified, nonspecific. Pancreas: The hypodense lesion within the pancreatic head  neck junction is again identified measuring roughly 1.9 x 2.3 cm at axial image # 34/2. The main pancreatic duct is dilated distal to this lesion and there is atrophy of the pancreatic parenchyma again noted. Infiltrative changes surrounding the head of the pancreas in this region appear stable since prior examination. No loculated peripancreatic fluid collections are identified. Progressive narrowing of the main portal vein adjacent to the lesion which now demonstrates near occlusion. Spleen: Unremarkable Adrenals/Urinary Tract: The adrenal glands are. Simple cysts are seen within kidney for which no follow-up imaging recommended. Kidneys are otherwise unremarkable. Bladder. Stomach/Bowel: Surgical changes of partial gastrectomy are again identified. The stomach, small bowel large. Appendix. No free intraperitoneal gas or fluid. Vascular/Lymphatic: As noted above, near occlusion the proximal main portal vein just beyond portosplenic. Extensive aortoiliac atherosclerotic calcification. No aortic aneurysm. Shotty peripancreatic is again identified appears stable. Reproductive: Brachytherapy seeds are seen within the prostate gland. Other: No abdominal wall hernia. Musculoskeletal: Degenerative changes are seen within the lumbar spine. No acute bone. Lytic blastic bone lesion. IMPRESSION: 1. Stable appearance of the hypodense lesion within the pancreatic head neck junction consistent with known pancreatic adenocarcinoma. Progressive narrowing of the main portal vein adjacent to the lesion which now demonstrates near occlusion. 2. Cholelithiasis. Mild pericholecystic inflammatory stranding again identified, nonspecific. If there is clinical concern for acute cholecystitis, further evaluation with right upper quadrant sonography could be considered. 3. Extensive pneumobilia, suggesting patency of the indwelling palliative metallic extrahepatic biliary stent. Aortic Atherosclerosis (ICD10-I70.0).  The Electronically  Signed   By: Helyn Numbers M.D.   On: 08/08/2023 01:59   MR Abdomen W Wo Contrast Result Date: 08/06/2023 CLINICAL DATA:  Pancreatic cancer assess treatment response, characterize liver lesions as cysts versus metastases EXAM: MRI ABDOMEN WITHOUT AND WITH CONTRAST TECHNIQUE: Multiplanar multisequence MR imaging of the abdomen was performed both before and after the administration of intravenous contrast. CONTRAST:  10mL GADAVIST GADOBUTROL 1 MMOL/ML IV SOLN COMPARISON:  CT abdomen pelvis, 07/01/2023 FINDINGS: Lower chest: No acute abnormality. Hepatobiliary: No solid liver abnormality is seen. Multiple fluid signal cysts scattered throughout the liver, previously identified by prior CT. No solid lesion or suspicious contrast enhancement. Unchanged gallbladder wall thickening. Sludge and small gallstones in the gallbladder as well as a small air-fluid level. Common bile duct stent, which appears patent, with unchanged mild intrahepatic biliary ductal dilatation and post stenting pneumobilia. Pancreas: Unchanged appearance of the pancreas with an ill-defined, infiltrative mass of the pancreatic head encasing the adjacent central superior mesenteric vein and contacting the superior mesenteric artery (series 1004, image 66). Although difficult to accurately marginate and measure this is approximately 5.6 x 3.3 cm (series 1004, image 87). Unchanged diffuse atrophy and cystic change of the distal pancreas, with distention of the pancreatic duct up to 1.0 cm (series 12, image 22). Spleen: Mild splenomegaly, maximum coronal span 14.2 cm. Adrenals/Urinary Tract: Adrenal glands are unremarkable. Simple, benign bilateral renal cortical and parapelvic cysts, for which no further follow-up or characterization is required. Kidneys are otherwise normal, without obvious renal calculi, solid lesion, or hydronephrosis. Stomach/Bowel: Stomach is within normal limits. No evidence of bowel wall thickening, distention, or inflammatory  changes. Vascular/Lymphatic: Aortic atherosclerosis. No enlarged abdominal lymph nodes. Other: No abdominal wall hernia or abnormality. No ascites. Musculoskeletal: No acute  or significant osseous findings. IMPRESSION: 1. Unchanged appearance of the pancreas with an ill-defined, infiltrative mass of the pancreatic head encasing the adjacent central superior mesenteric vein and contacting the superior mesenteric artery. 2. Unchanged diffuse atrophy and cystic change of the distal pancreas, with distention of the pancreatic duct up to 1.0 cm. 3. Common bile duct stent, which appears patent, with unchanged mild intrahepatic biliary ductal dilatation and post stenting pneumobilia. 4. Multiple fluid signal cysts scattered throughout the liver, previously identified by prior CT. No solid lesion or suspicious contrast enhancement. No evidence of lymphadenopathy or distant metastatic disease in the abdomen. 5. Unchanged gallbladder wall thickening. Sludge and small gallstones in the gallbladder as well as a small air-fluid level. Aortic Atherosclerosis (ICD10-I70.0). Electronically Signed   By: Jearld Lesch M.D.   On: 08/06/2023 08:05      IMPRESSION/PLAN: 1. 83 y.o. man with locally advanced pancreatic cancer  Today, we talked to the patient and his daughter, Marcus [name], about the findings and workup thus far. We discussed the natural history of pancreatic adenocarcinoma and general treatment, highlighting the role of radiotherapy in the management. We discussed the available radiation techniques, and focused on the details and logistics of delivery.  The recommendation is for a 5-1/2-week course of daily external beam radiation concurrent with Xeloda.  We reviewed the anticipated acute and late sequelae associated with radiation in this setting.  He appears to have a good understanding of his disease and our treatment recommendations which are operative intent.  He understands that while there is a very low likelihood  that we will cure his disease, this treatment will certainly increase the probability of gaining more durable, long-term control. The patient was encouraged to ask questions that were answered to his stated satisfaction.  At the end of our discussion, the patient is in agreement to proceed with the recommended 5-1/2-week course of daily external beam radiation concurrent with Xeloda. He has freely signed consent to proceed today in the office and a copy of this document will be placed in his medical record.  He is tentatively scheduled for CT simulation at 11 AM on Friday, 08/26/2023 so we will share our discussion with Dr. Truett Perna and proceed with treatment planning accordingly, in anticipation of beginning his daily treatments in the near future.  We enjoyed meeting him and his daughter, Marcus [name], and forward to continuing to participate in his care.  We personally spent 70 minutes in this encounter including chart review, reviewing radiological studies, meeting face-to-face with the patient, entering orders and completing documentation.    Marguarite Arbour, PA-C    Margaretmary Dys, MD  [facility]  Radiation Oncology Direct Dial: [phone]  Fax: [phone] [email or web address]  Skype  LinkedIn   This document serves as a record of services personally performed by Margaretmary Dys, MD and Marcello Fennel, PA-C. It was created on their behalf by Mickie Bail, a trained medical scribe. The creation of this record is based on the scribe's personal observations and the provider's statements to them. This document has been checked and approved by the attending provider.

## 2023-08-23 ENCOUNTER — Telehealth: Payer: Self-pay

## 2023-08-23 ENCOUNTER — Telehealth: Payer: Self-pay | Admitting: Student

## 2023-08-23 DIAGNOSIS — Z01818 Encounter for other preprocedural examination: Secondary | ICD-10-CM

## 2023-08-23 DIAGNOSIS — I4430 Unspecified atrioventricular block: Secondary | ICD-10-CM

## 2023-08-23 NOTE — Telephone Encounter (Signed)
Spoke with pt and confirmed his appt with Lonna Cobb, NP on 08/25/23 at 1145

## 2023-08-23 NOTE — Telephone Encounter (Signed)
TO BE COMPLETED BY RADIATION ONCOLOGIST OFFICE:   Patient Name: Marcus [name]   Date of Birth: [birth year]   Radiation Oncologist:   Site to be Treated:   Will x-rays >10 MV be used? No  Will the radiation be >10 cm from the device? Yes  Planned Treatment Start Date:   TO BE COMPLETED BY CARDIOLOGIST OFFICE:   Device Information:  Pacemaker [x]      ICD []    Brand: [employer]: [phone] Model #: [id number]  Serial Number: [id number]     Date of Placement: 08/02/2018  Site of Placement: Left chest  Remote Device Check--Frequency: 91 days   Last Check: 08/21/2023  Is the Patient Pacer Dependent?:  Yes []   No [x]   Does cardiologist request Radiation Oncology to schedule device testing by vendor for the following:  Prior to the Initiation of Treatments?  Yes []  No [x]  During Treatments?  Yes []  No [x]  Post Radiation Treatments?  Yes [x]  No []   Is device monitoring necessary by vendor/cardiologist team during treatments?  Yes []   No [x]   Is cardiac monitoring by Radiation Oncology nursing necessary during treatments? Yes []   No [x]   Do you recommend device be relocated prior to Radiation Treatment? Yes []   No [x]   **PLEASE LIST ANY NOTES OR SPECIAL REQUESTS:  Ideally would schedule in office visit once radiation series is completed. Does not need to be checked after individual treatments.    CARDIOLOGIST:  Dr. Lewayne Bunting Per Device Clinic Standing Orders, Mariam Dollar [place], [place]   08/23/2023 11:50 AM

## 2023-08-25 ENCOUNTER — Inpatient Hospital Stay: Payer: No Typology Code available for payment source

## 2023-08-25 ENCOUNTER — Encounter: Payer: Self-pay | Admitting: Nurse Practitioner

## 2023-08-25 ENCOUNTER — Other Ambulatory Visit (HOSPITAL_BASED_OUTPATIENT_CLINIC_OR_DEPARTMENT_OTHER): Payer: Self-pay

## 2023-08-25 ENCOUNTER — Inpatient Hospital Stay (HOSPITAL_BASED_OUTPATIENT_CLINIC_OR_DEPARTMENT_OTHER): Payer: No Typology Code available for payment source | Admitting: Nurse Practitioner

## 2023-08-25 ENCOUNTER — Telehealth: Payer: Self-pay

## 2023-08-25 ENCOUNTER — Other Ambulatory Visit (HOSPITAL_COMMUNITY): Payer: Self-pay

## 2023-08-25 VITALS — BP 128/75 | HR 77 | Temp 98.2°F | Resp 18 | Ht 68.0 in | Wt 216.0 lb

## 2023-08-25 DIAGNOSIS — C250 Malignant neoplasm of head of pancreas: Secondary | ICD-10-CM | POA: Diagnosis not present

## 2023-08-25 DIAGNOSIS — M109 Gout, unspecified: Secondary | ICD-10-CM

## 2023-08-25 DIAGNOSIS — I2510 Atherosclerotic heart disease of native coronary artery without angina pectoris: Secondary | ICD-10-CM

## 2023-08-25 DIAGNOSIS — I10 Essential (primary) hypertension: Secondary | ICD-10-CM

## 2023-08-25 DIAGNOSIS — D649 Anemia, unspecified: Secondary | ICD-10-CM

## 2023-08-25 DIAGNOSIS — Z9884 Bariatric surgery status: Secondary | ICD-10-CM

## 2023-08-25 DIAGNOSIS — Z85038 Personal history of other malignant neoplasm of large intestine: Secondary | ICD-10-CM

## 2023-08-25 DIAGNOSIS — I4891 Unspecified atrial fibrillation: Secondary | ICD-10-CM | POA: Diagnosis not present

## 2023-08-25 DIAGNOSIS — G4730 Sleep apnea, unspecified: Secondary | ICD-10-CM

## 2023-08-25 DIAGNOSIS — K5900 Constipation, unspecified: Secondary | ICD-10-CM

## 2023-08-25 DIAGNOSIS — J479 Bronchiectasis, uncomplicated: Secondary | ICD-10-CM

## 2023-08-25 DIAGNOSIS — C259 Malignant neoplasm of pancreas, unspecified: Secondary | ICD-10-CM

## 2023-08-25 DIAGNOSIS — K219 Gastro-esophageal reflux disease without esophagitis: Secondary | ICD-10-CM

## 2023-08-25 LAB — CMP (CANCER CENTER ONLY)
ALT: 13 U/L (ref 0–44)
AST: 14 U/L — ABNORMAL LOW (ref 15–41)
Albumin: 3.7 g/dL (ref 3.5–5.0)
Alkaline Phosphatase: 94 U/L (ref 38–126)
Anion gap: 8 (ref 5–15)
BUN: 26 mg/dL — ABNORMAL HIGH (ref 8–23)
CO2: 28 mmol/L (ref 22–32)
Calcium: 9.6 mg/dL (ref 8.9–10.3)
Chloride: 104 mmol/L (ref 98–111)
Creatinine: 1.24 mg/dL (ref 0.61–1.24)
GFR, Estimated: 58 mL/min — ABNORMAL LOW (ref >60)
Glucose, Bld: 127 mg/dL — ABNORMAL HIGH (ref 70–99)
Potassium: 4.2 mmol/L (ref 3.5–5.1)
Sodium: 140 mmol/L (ref 135–145)
Total Bilirubin: 0.4 mg/dL (ref 0.0–1.2)
Total Protein: 7.1 g/dL (ref 6.5–8.1)

## 2023-08-25 LAB — CBC WITH DIFFERENTIAL (CANCER CENTER ONLY)
Abs Immature Granulocytes: 0.03 10*3/uL (ref 0.00–0.07)
Basophils Absolute: 0 10*3/uL (ref 0.0–0.1)
Basophils Relative: 0 %
Eosinophils Absolute: 0.2 10*3/uL (ref 0.0–0.5)
Eosinophils Relative: 4 %
HCT: 32.5 % — ABNORMAL LOW (ref 39.0–52.0)
Hemoglobin: 10.1 g/dL — ABNORMAL LOW (ref 13.0–17.0)
Immature Granulocytes: 1 %
Lymphocytes Relative: 17 %
Lymphs Abs: 0.8 10*3/uL (ref 0.7–4.0)
MCH: 27.1 pg (ref 26.0–34.0)
MCHC: 31.1 g/dL (ref 30.0–36.0)
MCV: 87.1 fL (ref 80.0–100.0)
Monocytes Absolute: 0.5 10*3/uL (ref 0.1–1.0)
Monocytes Relative: 10 %
Neutro Abs: 3.3 10*3/uL (ref 1.7–7.7)
Neutrophils Relative %: 68 %
Platelet Count: 209 10*3/uL (ref 150–400)
RBC: 3.73 MIL/uL — ABNORMAL LOW (ref 4.22–5.81)
RDW: 16.4 % — ABNORMAL HIGH (ref 11.5–15.5)
WBC Count: 4.8 10*3/uL (ref 4.0–10.5)
nRBC: 0 % (ref 0.0–0.2)

## 2023-08-25 MED ORDER — CAPECITABINE 500 MG PO TABS: 1500.0000 mg | ORAL_TABLET | Freq: Two times a day (BID) | ORAL | 0 refills | Status: DC

## 2023-08-25 MED ORDER — CAPECITABINE 500 MG PO TABS: ORAL_TABLET | ORAL | 0 refills | Status: DC

## 2023-08-25 NOTE — Progress Notes (Signed)
  Radiation Oncology         [phone] ________________________________  Name: Marcus [name] MRN: [id number]  Date: 08/26/2023  DOB: Jan 14, 1941  SIMULATION AND TREATMENT PLANNING NOTE  DIAGNOSIS:  83 y/o man with locally advanced pancreatic cancer     ICD-10-CM   1. Pancreatic adenocarcinoma (HCC)  C25.9        CONSENT VERIFIED: yes   SET UP: Patient is set-up supine   IMMOBILIZATION: The following immobilization is used: body fix. This complex treatment device will be used on a daily basis during the patient's treatment.   NARRATIVE: The patient was brought to the CT Simulation planning suite. Identity was confirmed. All relevant records and images related to the planned course of therapy were reviewed. Then, the patient was positioned in a stable reproducible clinical set-up for radiation therapy using a customized Vac-lock bag. Skin markings were placed. The CT images were loaded into the planning software where the target and avoidance structures were contoured.The radiation prescription was entered and confirmed.   The patient will receive 45 Gy in 25 fractions to the pancreas tumor and regional nodes, followed by a boost to the prostate tumor to 50.4 Gy  Daily image guidance is ordered, and this will be used on a daily basis. This is necessary to ensure accurate and precise localization of the target in addition to accurate alignment of the normal tissue structures in this region.   Treatment planning then occurred.   I have requested : Intensity Modulated Radiotherapy (IMRT) is medically necessary for this case for the following reason: Dose homogeneity; the target is in close proximity to critical normal structures, including the kidneys and the liver. IMRT is thus medically to appropriately treat the patient.   Special treatment procedure  The patient will receive chemotherapy during the course of radiation treatment. The patient may experience increased or overlapping  toxicity due to this combined-modality approach and the patient will be monitored for such problems. This may include extra lab work as necessary. This therefore constitutes a special treatment procedure.    ------------------------------------------------   Margaretmary Dys, MD [facility]  Radiation Oncology Direct Dial: [phone]  Fax: [phone] [email or web address]  Skype  LinkedIn

## 2023-08-25 NOTE — Telephone Encounter (Signed)
Patient has referral from the [name] to be seen at [facility], so rx for capecitabine redirected to [facility].

## 2023-08-25 NOTE — Telephone Encounter (Signed)
Oral Chemotherapy Pharmacist Encounter   Received new prescription for Xeloda (capecitabine) for the treatment of pancreas cancer in conjunction with radiation, planned duration 5 1/2 weeks. Tentative start date 09/07/23.   Labs from 08/25/23 assessed, no baseline dose adjustments required. Prescription dose and frequency assessed.   Current medication list in Epic reviewed, DDIs with capecitabine identified: Allopurinol: Allopurinol may decrease serum concentrations of the active metabolites of fluorouracil products such as capecitabine. Recommend to avoid concomitant use of capecitabine and allopurinol.  Esomeprazole: PPIs may diminish the therapeutic effect of capecitabine. Consider true clinical benefit of PPI. Patient takes PPI for GERD. If combined, monitor closely for any evidence of reduced capecitabine effectiveness with use of this combination.  Evaluated chart and no patient barriers to medication adherence identified.   Prescription has been e-scribed to the [facility] for benefits analysis and approval. Patient has active referral on file.   Oral Oncology Clinic will continue to follow for insurance authorization, copayment issues, initial counseling and start date.  Patient agreed to treatment on 08/25/23 per provider documentation.   Jerry Caras, PharmD PGY2 Oncology Pharmacy Resident   08/25/2023 1:23 PM

## 2023-08-25 NOTE — Telephone Encounter (Signed)
Patient gave verbal understanding and had no further questions or cocerns

## 2023-08-25 NOTE — Progress Notes (Signed)
[facility] OFFICE PROGRESS NOTE   Diagnosis: Pancreas cancer  INTERVAL HISTORY:   Marcus [name] returns for follow-up.  He has seen radiation oncology.  The plan is for a 5-1/2 week course of radiation with concurrent Xeloda.  He reports abdominal pain, constipation, inability to burp 3 to 4 days ago.  He had a bowel movement 08/23/2023 with relief of symptoms.  He has stopped his laxative.  Objective:  Vital signs in last 24 hours:  Blood pressure 128/75, pulse 77, temperature 98.2 F (36.8 C), temperature source Temporal, resp. rate 18, height 5\' 8"  (1.727 m), weight 216 lb (98 kg), SpO2 100%.    HEENT: No thrush or ulcers. Resp: Lungs clear bilaterally. Cardio: Regular rate and rhythm. GI: No hepatosplenomegaly.  No mass.  Nontender. Vascular: No leg edema. Neuro: Alert and oriented. Port-A-Cath without erythema.  Lab Results:  Lab Results  Component Value Date   WBC 4.7 08/13/2023   HGB 10.3 (L) 08/13/2023   HCT 32.7 (L) 08/13/2023   MCV 87.0 08/13/2023   PLT 191 08/13/2023   NEUTROABS 3.3 07/22/2023     Medications: I have reviewed the patient's current medications.  Assessment/Plan: Pancreas cancer CT abdomen/pelvis 12/03/2022-suspected pancreas mass in the proximal body measuring 2 cm with probable mass effect on the superior aspect of the SMV, multiple hypodense liver lesions, many are new from a remote CT-indeterminate, 2 cystic lesions in the pancreas tail-nonspecific prior bariatric surgery ERCP 12/13/2022-malignant appearing common bile duct stricture, plastic stent placed, brushing cytology-no malignant cells EUS 12/23/2022-30 x 33 mm pancreas head mass with invasion of the SMV, no malignant appearing lymph nodes, 4 cysts in the visualized liver, FNA-adenocarcinoma Cycle 1 gemcitabine and abraxane 01/07/2023 CTs 01/09/2023-multiple low-attenuation liver lesions unchanged, largest of which are clearly benign fluid attenuation cyst, others more  ill-defined and incompletely characterized suspicious for small metastases.  Similar appearance of hypodense mass in the pancreatic head.  Distended gallbladder with mild gallbladder wall thickening and adjacent pericholecystic fat stranding.  Sludge in the gallbladder fundus.  Common bile duct stent in place with diminished, persistent intrahepatic biliary ductal dilatation.  Negative for pulmonary embolism.  Acute appearing heterogeneous airspace opacity of the superior segment left lower lobe consistent with infection or aspiration. Cycle 2 gemcitabine and abraxane 01/21/2023, gemcitabine dose reduced due to neutropenia following cycle 1 Cycle 3 gemcitabine/Abraxane 02/04/2023 Cycle 4 gemcitabine/Abraxane 02/17/2023 Cycle 5 gemcitabine/Abraxane 03/04/2023 Cycle 6 gemcitabine/Abraxane 03/17/2023 CT abdomen/pelvis 03/28/2023-stable pancreas mass, multiple small hypoattenuating liver lesions (review of CTs at the GI tumor conference is most consistent with liver metastases-some smaller compared to the baseline CT) Cycle 7 gemcitabine/Abraxane 04/01/2023 Cycle 8 gemcitabine/Abraxane 04/15/2023 Cycle 9 gemcitabine/Abraxane 04/29/2023 Treatment held per patient request 05/12/2023 Cycle 10 gemcitabine/Abraxane 05/27/2023 Cycle 11 gemcitabine/Abraxane 06/10/2023 Cycle 12 gemcitabine/Abraxane 06/24/2023 CTs 07/01/2023-no significant difference in size of the mass in the pancreatic head.  Redemonstration of multiple hypoattenuating liver lesions essentially unchanged and favored to represent simple cysts. 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, cholelithiasis and mild pericholecystic inflammatory stranding Plan for 5-1/2-week course of  radiation plus concurrent Xeloda, start date 09/07/2023 Biliary obstruction secondary to 1-status post ERCP/stent placement 12/13/2022 Coronary artery disease Atrial fibrillation Gout Sleep apnea Status post bariatric surgery Hypertension Bronchiectasis Gastroesophageal reflux disease History of colon cancer in his 40s Complete heart block-pacemaker placed Admission 01/09/2023 with a fever and failure to thrive, CT chest with evidence of left lung pneumonia, CT abdomen/pelvis with a distended gallbladder and adjacent fat stranding Anemia 08/07/2023: Acute onset nausea/vomiting and abdominal pain-resolved after Zofran/morphine in the emergency room, CT without acute findings  Disposition: Marcus [name] appears stable.  We reviewed the recommendation for radiation and concurrent Xeloda.  We reviewed potential side effects associated with Xeloda including bone marrow toxicity, nausea, mouth sores, diarrhea, hair loss, hand-foot syndrome, skin rash, increased sensitivity to sun, cardiotoxicity.  He was provided with printed information on Xeloda.  He agrees to proceed.  Anticipated start date 09/07/2023.  He had another episode of abdominal pain, constipation.  Symptoms resolved once he had a bowel movement.  He will resume a laxative regimen.  We also provided him with information on a low residue diet.  He will return for lab and follow-up 09/21/2023 which will be about 2 weeks into the radiation course.  We are available to see him sooner if needed.  Patient seen with Dr. Truett Perna.    Lonna Cobb ANP/GNP-BC   08/25/2023  12:30 PM  This was a shared visit with Lonna Cobb.  Mr. Sheahan was interviewed and examined.  He has decided to  proceed with concurrent capecitabine and radiation.  We reviewed potential toxicities associated with capecitabine.  He agrees to proceed.  The plan is to begin concurrent treatment on 09/07/2023.  He will take capecitabine on days of radiation only.  The etiology of the abdominal pain/constipation is unclear.  He will continue a laxative regimen.  An order for capecitabine was placed today.  I was present for greater than 50% of today's visit.  I performed medical decision making.  Mancel Bale, MD

## 2023-08-25 NOTE — Telephone Encounter (Signed)
-----   Message from Lonna Cobb sent at 08/25/2023  1:31 PM EST ----- Please let him know potassium is 4.2, normal range.  Prescription potassium not indicated at this time.  We will continue to monitor.

## 2023-08-26 ENCOUNTER — Ambulatory Visit
Admission: RE | Admit: 2023-08-26 | Discharge: 2023-08-26 | Disposition: A | Discharge status: Home / Self Care | Payer: Non-veteran care | Source: Ambulatory Visit | Attending: Radiation Oncology | Admitting: Radiation Oncology

## 2023-08-26 VITALS — BP 130/76 | HR 62 | Temp 97.7°F | Resp 18 | Ht 68.0 in

## 2023-08-26 DIAGNOSIS — C259 Malignant neoplasm of pancreas, unspecified: Secondary | ICD-10-CM

## 2023-08-26 DIAGNOSIS — Z510 Encounter for antineoplastic radiation therapy: Secondary | ICD-10-CM | POA: Diagnosis present

## 2023-08-26 DIAGNOSIS — C250 Malignant neoplasm of head of pancreas: Secondary | ICD-10-CM | POA: Diagnosis not present

## 2023-08-26 DIAGNOSIS — J441 Chronic obstructive pulmonary disease with (acute) exacerbation: Secondary | ICD-10-CM

## 2023-08-26 LAB — CANCER ANTIGEN 19-9: CA 19-9: 1251 U/mL — ABNORMAL HIGH (ref 0–35)

## 2023-08-26 MED ORDER — SODIUM CHLORIDE 0.9% FLUSH
10.0000 mL | Freq: Once | INTRAVENOUS | Status: AC
  Administered 2023-08-26: 10 mL

## 2023-08-26 MED ORDER — HEPARIN SOD (PORK) LOCK FLUSH 100 UNIT/ML IV SOLN
500.0000 [IU] | Freq: Once | INTRAVENOUS | Status: AC
  Administered 2023-08-26: 500 [IU] via INTRAVENOUS

## 2023-08-26 NOTE — Telephone Encounter (Signed)
Spoke with Marcus [name], he will hold his allopurinol, esomeprazole, and multivitamin while competing this capecitabine and radiation.  He knows famotidine can be used as an alternative if needed for his reflux.  Patient also reports taking bisacodyl and valsartan/HCT, wich we added to his medication list.

## 2023-08-26 NOTE — Progress Notes (Signed)
Has armband been applied?  Yes.    Does patient have an allergy to IV contrast dye?: No.   Has patient ever received premedication for IV contrast dye?: No.   Does patient take metformin?: No.  If patient does take metformin when was the last dose: N/A  Date of lab work: 08/25/2023 BUN: 26 CR: 1.24 eGfr: 58  IV site: Porta cath  Has IV site been added to flowsheet?  Yes.     BP 130/76 (BP Location: Left Arm)   Pulse 62   Temp 97.7 F (36.5 C) (Temporal)   Resp 18   Ht 5\' 8"  (1.727 m)   SpO2 100%   BMI 32.84 kg/m

## 2023-08-31 NOTE — Telephone Encounter (Signed)
 Oral Chemotherapy Pharmacist Encounter   Patient Education I spoke with patient for overview of new oral chemotherapy medication: Xeloda (capecitabine) for the treatment of pancreas cancer, planned duration 5 1/2 weeks.   He is receiving his medication from the [name]. He stated that, per the [name], the capecitabine is currently in transit to be delivered to the patient's home address.   Counseled patient on administration, dosing, side effects, monitoring, drug-food interactions, safe handling, storage, and disposal. Patient will take 3 tablets (1,500 mg total) by mouth 2 (two) times daily after a meal. He will take capecitabine Monday-Friday only days of radiation.   Side effects include but not limited to: diarrhea, hand-foot syndrome, myelosuppression, and mucositis Diarrhea Discussed how diarrhea is a common adverse effect that can occur at any time during treatment with capecitabine.  Educated patient on the use of Imodium to take as needed for the management of diarrhea. He knows he can obtain OTC. He is aware that he will need to alert the office of 4 or more loose stools above baseline.  Hand Foot Syndrome (HFS)  Educated patient on common presentations of HFS including onset, severity, and signs and symptoms.  Reviewed the importance of the application of fragrance free lotion/urea based cream (U[place]derly Smooth Extra Care 20%) to be applied continuously throughout the day  Mucositis Reviewed symptoms of mucositis including pain and tenderness of mucosal sores/lesions  He is aware that he can reach out to the office to obtain magic mouthwash for additional supportive care  Reviewed with patient importance of keeping a medication schedule and plan for any missed doses.  After discussion with patient no patient barriers to medication adherence identified.   He voiced understanding and appreciation. All questions answered. Medication handout provided.  Provided patient with Oral Chemotherapy  [facility] phone number. Patient knows to call the office with questions or concerns. Oral Chemotherapy [facility] will continue to follow.   Jerry Caras, PharmD PGY2 Oncology Pharmacy Resident   08/31/2023 11:17 AM

## 2023-09-01 ENCOUNTER — Inpatient Hospital Stay (HOSPITAL_BASED_OUTPATIENT_CLINIC_OR_DEPARTMENT_OTHER): Payer: No Typology Code available for payment source | Admitting: Nurse Practitioner

## 2023-09-01 ENCOUNTER — Other Ambulatory Visit: Payer: Self-pay

## 2023-09-01 ENCOUNTER — Emergency Department (HOSPITAL_COMMUNITY): Payer: No Typology Code available for payment source

## 2023-09-01 ENCOUNTER — Ambulatory Visit (HOSPITAL_BASED_OUTPATIENT_CLINIC_OR_DEPARTMENT_OTHER): Admission: RE | Admit: 2023-09-01 | Payer: Non-veteran care | Source: Ambulatory Visit

## 2023-09-01 ENCOUNTER — Telehealth: Payer: Self-pay

## 2023-09-01 ENCOUNTER — Inpatient Hospital Stay: Payer: No Typology Code available for payment source

## 2023-09-01 ENCOUNTER — Inpatient Hospital Stay (HOSPITAL_COMMUNITY)
Admission: EM | Admit: 2023-09-01 | Discharge: 2023-09-07 | DRG: 444 | Disposition: A | Discharge status: Home Health | Payer: No Typology Code available for payment source | Attending: Internal Medicine | Admitting: Internal Medicine

## 2023-09-01 ENCOUNTER — Encounter: Payer: Self-pay | Admitting: Nurse Practitioner

## 2023-09-01 VITALS — BP 122/72 | HR 60 | Temp 97.5°F | Resp 20

## 2023-09-01 VITALS — BP 142/70 | HR 60 | Temp 97.8°F | Resp 18 | Wt 217.3 lb

## 2023-09-01 DIAGNOSIS — D61810 Antineoplastic chemotherapy induced pancytopenia: Secondary | ICD-10-CM | POA: Diagnosis present

## 2023-09-01 DIAGNOSIS — Z8249 Family history of ischemic heart disease and other diseases of the circulatory system: Secondary | ICD-10-CM

## 2023-09-01 DIAGNOSIS — K2289 Other specified disease of esophagus: Secondary | ICD-10-CM | POA: Diagnosis not present

## 2023-09-01 DIAGNOSIS — K8001 Calculus of gallbladder with acute cholecystitis with obstruction: Secondary | ICD-10-CM | POA: Diagnosis present

## 2023-09-01 DIAGNOSIS — D490 Neoplasm of unspecified behavior of digestive system: Secondary | ICD-10-CM | POA: Diagnosis not present

## 2023-09-01 DIAGNOSIS — Z9884 Bariatric surgery status: Secondary | ICD-10-CM | POA: Diagnosis not present

## 2023-09-01 DIAGNOSIS — K311 Adult hypertrophic pyloric stenosis: Secondary | ICD-10-CM | POA: Diagnosis present

## 2023-09-01 DIAGNOSIS — Z85038 Personal history of other malignant neoplasm of large intestine: Secondary | ICD-10-CM | POA: Diagnosis not present

## 2023-09-01 DIAGNOSIS — R112 Nausea with vomiting, unspecified: Secondary | ICD-10-CM | POA: Diagnosis not present

## 2023-09-01 DIAGNOSIS — K831 Obstruction of bile duct: Secondary | ICD-10-CM | POA: Diagnosis not present

## 2023-09-01 DIAGNOSIS — Z9842 Cataract extraction status, left eye: Secondary | ICD-10-CM

## 2023-09-01 DIAGNOSIS — Z96652 Presence of left artificial knee joint: Secondary | ICD-10-CM | POA: Diagnosis present

## 2023-09-01 DIAGNOSIS — M109 Gout, unspecified: Secondary | ICD-10-CM | POA: Diagnosis present

## 2023-09-01 DIAGNOSIS — Z9841 Cataract extraction status, right eye: Secondary | ICD-10-CM

## 2023-09-01 DIAGNOSIS — Z8546 Personal history of malignant neoplasm of prostate: Secondary | ICD-10-CM

## 2023-09-01 DIAGNOSIS — F32A Depression, unspecified: Secondary | ICD-10-CM | POA: Diagnosis present

## 2023-09-01 DIAGNOSIS — G4733 Obstructive sleep apnea (adult) (pediatric): Secondary | ICD-10-CM | POA: Diagnosis present

## 2023-09-01 DIAGNOSIS — K819 Cholecystitis, unspecified: Secondary | ICD-10-CM

## 2023-09-01 DIAGNOSIS — J449 Chronic obstructive pulmonary disease, unspecified: Secondary | ICD-10-CM | POA: Diagnosis not present

## 2023-09-01 DIAGNOSIS — Z980 Intestinal bypass and anastomosis status: Secondary | ICD-10-CM | POA: Diagnosis not present

## 2023-09-01 DIAGNOSIS — I81 Portal vein thrombosis: Secondary | ICD-10-CM | POA: Diagnosis present

## 2023-09-01 DIAGNOSIS — Z8507 Personal history of malignant neoplasm of pancreas: Secondary | ICD-10-CM | POA: Diagnosis not present

## 2023-09-01 DIAGNOSIS — C259 Malignant neoplasm of pancreas, unspecified: Secondary | ICD-10-CM

## 2023-09-01 DIAGNOSIS — K315 Obstruction of duodenum: Secondary | ICD-10-CM | POA: Diagnosis present

## 2023-09-01 DIAGNOSIS — I482 Chronic atrial fibrillation, unspecified: Secondary | ICD-10-CM | POA: Diagnosis present

## 2023-09-01 DIAGNOSIS — Z961 Presence of intraocular lens: Secondary | ICD-10-CM | POA: Diagnosis present

## 2023-09-01 DIAGNOSIS — Z923 Personal history of irradiation: Secondary | ICD-10-CM

## 2023-09-01 DIAGNOSIS — K828 Other specified diseases of gallbladder: Secondary | ICD-10-CM | POA: Diagnosis present

## 2023-09-01 DIAGNOSIS — I442 Atrioventricular block, complete: Secondary | ICD-10-CM | POA: Diagnosis present

## 2023-09-01 DIAGNOSIS — R2689 Other abnormalities of gait and mobility: Secondary | ICD-10-CM | POA: Diagnosis not present

## 2023-09-01 DIAGNOSIS — Z66 Do not resuscitate: Secondary | ICD-10-CM | POA: Diagnosis present

## 2023-09-01 DIAGNOSIS — K3189 Other diseases of stomach and duodenum: Secondary | ICD-10-CM | POA: Diagnosis present

## 2023-09-01 DIAGNOSIS — R935 Abnormal findings on diagnostic imaging of other abdominal regions, including retroperitoneum: Secondary | ICD-10-CM

## 2023-09-01 DIAGNOSIS — K449 Diaphragmatic hernia without obstruction or gangrene: Secondary | ICD-10-CM | POA: Diagnosis present

## 2023-09-01 DIAGNOSIS — Z6833 Body mass index (BMI) 33.0-33.9, adult: Secondary | ICD-10-CM

## 2023-09-01 DIAGNOSIS — I10 Essential (primary) hypertension: Secondary | ICD-10-CM | POA: Diagnosis present

## 2023-09-01 DIAGNOSIS — Z95828 Presence of other vascular implants and grafts: Secondary | ICD-10-CM

## 2023-09-01 DIAGNOSIS — J479 Bronchiectasis, uncomplicated: Secondary | ICD-10-CM | POA: Diagnosis present

## 2023-09-01 DIAGNOSIS — I2510 Atherosclerotic heart disease of native coronary artery without angina pectoris: Secondary | ICD-10-CM | POA: Diagnosis present

## 2023-09-01 DIAGNOSIS — Z87891 Personal history of nicotine dependence: Secondary | ICD-10-CM

## 2023-09-01 DIAGNOSIS — R109 Unspecified abdominal pain: Secondary | ICD-10-CM | POA: Diagnosis present

## 2023-09-01 DIAGNOSIS — Z9889 Other specified postprocedural states: Secondary | ICD-10-CM

## 2023-09-01 DIAGNOSIS — R262 Difficulty in walking, not elsewhere classified: Secondary | ICD-10-CM | POA: Diagnosis not present

## 2023-09-01 DIAGNOSIS — Z79899 Other long term (current) drug therapy: Secondary | ICD-10-CM

## 2023-09-01 DIAGNOSIS — E871 Hypo-osmolality and hyponatremia: Secondary | ICD-10-CM | POA: Diagnosis present

## 2023-09-01 DIAGNOSIS — N400 Enlarged prostate without lower urinary tract symptoms: Secondary | ICD-10-CM | POA: Diagnosis present

## 2023-09-01 DIAGNOSIS — Z9181 History of falling: Secondary | ICD-10-CM | POA: Diagnosis not present

## 2023-09-01 DIAGNOSIS — C250 Malignant neoplasm of head of pancreas: Secondary | ICD-10-CM | POA: Diagnosis present

## 2023-09-01 DIAGNOSIS — K219 Gastro-esophageal reflux disease without esophagitis: Secondary | ICD-10-CM | POA: Diagnosis present

## 2023-09-01 DIAGNOSIS — K810 Acute cholecystitis: Principal | ICD-10-CM

## 2023-09-01 DIAGNOSIS — Z950 Presence of cardiac pacemaker: Secondary | ICD-10-CM | POA: Diagnosis not present

## 2023-09-01 DIAGNOSIS — R627 Adult failure to thrive: Secondary | ICD-10-CM | POA: Diagnosis present

## 2023-09-01 DIAGNOSIS — I4891 Unspecified atrial fibrillation: Secondary | ICD-10-CM | POA: Diagnosis not present

## 2023-09-01 DIAGNOSIS — D649 Anemia, unspecified: Secondary | ICD-10-CM | POA: Diagnosis not present

## 2023-09-01 DIAGNOSIS — K2980 Duodenitis without bleeding: Secondary | ICD-10-CM | POA: Diagnosis present

## 2023-09-01 DIAGNOSIS — T182XXA Foreign body in stomach, initial encounter: Secondary | ICD-10-CM | POA: Diagnosis not present

## 2023-09-01 DIAGNOSIS — Z7901 Long term (current) use of anticoagulants: Secondary | ICD-10-CM

## 2023-09-01 DIAGNOSIS — G4730 Sleep apnea, unspecified: Secondary | ICD-10-CM | POA: Diagnosis not present

## 2023-09-01 DIAGNOSIS — E66811 Obesity, class 1: Secondary | ICD-10-CM | POA: Diagnosis present

## 2023-09-01 DIAGNOSIS — I4430 Unspecified atrioventricular block: Secondary | ICD-10-CM | POA: Diagnosis present

## 2023-09-01 DIAGNOSIS — R1013 Epigastric pain: Principal | ICD-10-CM

## 2023-09-01 LAB — CMP (CANCER CENTER ONLY)
ALT: 15 U/L (ref 0–44)
AST: 19 U/L (ref 15–41)
Albumin: 3.6 g/dL (ref 3.5–5.0)
Alkaline Phosphatase: 96 U/L (ref 38–126)
Anion gap: 8 (ref 5–15)
BUN: 25 mg/dL — ABNORMAL HIGH (ref 8–23)
CO2: 29 mmol/L (ref 22–32)
Calcium: 9 mg/dL (ref 8.9–10.3)
Chloride: 100 mmol/L (ref 98–111)
Creatinine: 1.11 mg/dL (ref 0.61–1.24)
GFR, Estimated: >60 mL/min (ref >60)
Glucose, Bld: 125 mg/dL — ABNORMAL HIGH (ref 70–99)
Potassium: 3.8 mmol/L (ref 3.5–5.1)
Sodium: 137 mmol/L (ref 135–145)
Total Bilirubin: 0.4 mg/dL (ref 0.0–1.2)
Total Protein: 6.6 g/dL (ref 6.5–8.1)

## 2023-09-01 LAB — CBC WITH DIFFERENTIAL (CANCER CENTER ONLY)
Abs Immature Granulocytes: 0.02 10*3/uL (ref 0.00–0.07)
Basophils Absolute: 0 10*3/uL (ref 0.0–0.1)
Basophils Relative: 1 %
Eosinophils Absolute: 0.1 10*3/uL (ref 0.0–0.5)
Eosinophils Relative: 3 %
HCT: 30.3 % — ABNORMAL LOW (ref 39.0–52.0)
Hemoglobin: 9.7 g/dL — ABNORMAL LOW (ref 13.0–17.0)
Immature Granulocytes: 1 %
Lymphocytes Relative: 20 %
Lymphs Abs: 0.7 10*3/uL (ref 0.7–4.0)
MCH: 27.4 pg (ref 26.0–34.0)
MCHC: 32 g/dL (ref 30.0–36.0)
MCV: 85.6 fL (ref 80.0–100.0)
Monocytes Absolute: 0.5 10*3/uL (ref 0.1–1.0)
Monocytes Relative: 13 %
Neutro Abs: 2.4 10*3/uL (ref 1.7–7.7)
Neutrophils Relative %: 62 %
Platelet Count: 174 10*3/uL (ref 150–400)
RBC: 3.54 MIL/uL — ABNORMAL LOW (ref 4.22–5.81)
RDW: 16.7 % — ABNORMAL HIGH (ref 11.5–15.5)
WBC Count: 3.8 10*3/uL — ABNORMAL LOW (ref 4.0–10.5)
nRBC: 0 % (ref 0.0–0.2)

## 2023-09-01 LAB — COMPREHENSIVE METABOLIC PANEL
ALT: 19 U/L (ref 0–44)
AST: 22 U/L (ref 15–41)
Albumin: 3.1 g/dL — ABNORMAL LOW (ref 3.5–5.0)
Alkaline Phosphatase: 94 U/L (ref 38–126)
Anion gap: 8 (ref 5–15)
BUN: 24 mg/dL — ABNORMAL HIGH (ref 8–23)
CO2: 26 mmol/L (ref 22–32)
Calcium: 8.7 mg/dL — ABNORMAL LOW (ref 8.9–10.3)
Chloride: 100 mmol/L (ref 98–111)
Creatinine, Ser: 1.11 mg/dL (ref 0.61–1.24)
GFR, Estimated: >60 mL/min (ref >60)
Glucose, Bld: 111 mg/dL — ABNORMAL HIGH (ref 70–99)
Potassium: 3.6 mmol/L (ref 3.5–5.1)
Sodium: 134 mmol/L — ABNORMAL LOW (ref 135–145)
Total Bilirubin: 0.4 mg/dL (ref 0.0–1.2)
Total Protein: 6.3 g/dL — ABNORMAL LOW (ref 6.5–8.1)

## 2023-09-01 LAB — CBC WITH DIFFERENTIAL/PLATELET
Abs Immature Granulocytes: 0.02 10*3/uL (ref 0.00–0.07)
Basophils Absolute: 0 10*3/uL (ref 0.0–0.1)
Basophils Relative: 1 %
Eosinophils Absolute: 0.1 10*3/uL (ref 0.0–0.5)
Eosinophils Relative: 3 %
HCT: 30.2 % — ABNORMAL LOW (ref 39.0–52.0)
Hemoglobin: 9.3 g/dL — ABNORMAL LOW (ref 13.0–17.0)
Immature Granulocytes: 1 %
Lymphocytes Relative: 16 %
Lymphs Abs: 0.6 10*3/uL — ABNORMAL LOW (ref 0.7–4.0)
MCH: 27 pg (ref 26.0–34.0)
MCHC: 30.8 g/dL (ref 30.0–36.0)
MCV: 87.5 fL (ref 80.0–100.0)
Monocytes Absolute: 0.4 10*3/uL (ref 0.1–1.0)
Monocytes Relative: 10 %
Neutro Abs: 2.5 10*3/uL (ref 1.7–7.7)
Neutrophils Relative %: 69 %
Platelets: 150 10*3/uL (ref 150–400)
RBC: 3.45 MIL/uL — ABNORMAL LOW (ref 4.22–5.81)
RDW: 16.7 % — ABNORMAL HIGH (ref 11.5–15.5)
WBC: 3.6 10*3/uL — ABNORMAL LOW (ref 4.0–10.5)
nRBC: 0 % (ref 0.0–0.2)

## 2023-09-01 LAB — URINALYSIS, ROUTINE W REFLEX MICROSCOPIC
Bilirubin Urine: NEGATIVE
Glucose, UA: NEGATIVE mg/dL
Hgb urine dipstick: NEGATIVE
Ketones, ur: NEGATIVE mg/dL
Leukocytes,Ua: NEGATIVE
Nitrite: NEGATIVE
Protein, ur: NEGATIVE mg/dL
Specific Gravity, Urine: 1.026 (ref 1.005–1.030)
pH: 7 (ref 5.0–8.0)

## 2023-09-01 LAB — APTT: aPTT: 36 s (ref 24–36)

## 2023-09-01 LAB — I-STAT CHEM 8, ED
BUN: 22 mg/dL (ref 8–23)
Calcium, Ion: 1.19 mmol/L (ref 1.15–1.40)
Chloride: 101 mmol/L (ref 98–111)
Creatinine, Ser: 1.2 mg/dL (ref 0.61–1.24)
Glucose, Bld: 105 mg/dL — ABNORMAL HIGH (ref 70–99)
HCT: 29 % — ABNORMAL LOW (ref 39.0–52.0)
Hemoglobin: 9.9 g/dL — ABNORMAL LOW (ref 13.0–17.0)
Potassium: 3.7 mmol/L (ref 3.5–5.1)
Sodium: 138 mmol/L (ref 135–145)
TCO2: 26 mmol/L (ref 22–32)

## 2023-09-01 LAB — HEPARIN LEVEL (UNFRACTIONATED): Heparin Unfractionated: >1.1 [IU]/mL — ABNORMAL HIGH (ref 0.30–0.70)

## 2023-09-01 LAB — LIPASE, BLOOD: Lipase: 32 U/L (ref 11–51)

## 2023-09-01 MED ORDER — SODIUM CHLORIDE 0.9% FLUSH: 3.0000 mL | INTRAVENOUS | Status: DC | PRN

## 2023-09-01 MED ORDER — ONDANSETRON HCL 4 MG/2ML IJ SOLN
INTRAMUSCULAR | Status: AC
  Administered 2023-09-01: 8 mg via INTRAVENOUS
  Filled 2023-09-01: qty 2

## 2023-09-01 MED ORDER — BISACODYL 10 MG RE SUPP: 10.0000 mg | Freq: Every day | RECTAL | Status: DC | PRN

## 2023-09-01 MED ORDER — PIPERACILLIN-TAZOBACTAM 3.375 G IVPB 30 MIN
3.3750 g | Freq: Once | INTRAVENOUS | Status: AC
  Administered 2023-09-01: 3.375 g via INTRAVENOUS
  Filled 2023-09-01: qty 50

## 2023-09-01 MED ORDER — ONDANSETRON HCL 4 MG/2ML IJ SOLN
4.0000 mg | Freq: Four times a day (QID) | INTRAMUSCULAR | Status: DC | PRN
Start: 2023-09-01 — End: 2023-09-07

## 2023-09-01 MED ORDER — ACETAMINOPHEN 325 MG PO TABS
650.0000 mg | ORAL_TABLET | Freq: Four times a day (QID) | ORAL | Status: DC | PRN
  Administered 2023-09-03: 650 mg via ORAL
  Filled 2023-09-01 (×4): qty 2

## 2023-09-01 MED ORDER — ACETAMINOPHEN 650 MG RE SUPP: 650.0000 mg | Freq: Four times a day (QID) | RECTAL | Status: DC | PRN

## 2023-09-01 MED ORDER — CHLORHEXIDINE GLUCONATE CLOTH 2 % EX PADS
6.0000 | MEDICATED_PAD | Freq: Every day | CUTANEOUS | Status: DC
  Administered 2023-09-01 – 2023-09-06 (×6): 6 via TOPICAL

## 2023-09-01 MED ORDER — SODIUM CHLORIDE 0.9 % IV BOLUS
500.0000 mL | Freq: Once | INTRAVENOUS | Status: AC
  Administered 2023-09-01: 500 mL via INTRAVENOUS

## 2023-09-01 MED ORDER — ONDANSETRON HCL 4 MG/2ML IJ SOLN
8.0000 mg | Freq: Once | INTRAMUSCULAR | Status: AC
Start: 2023-09-01 — End: 2023-09-01
  Filled 2023-09-01: qty 4

## 2023-09-01 MED ORDER — HEPARIN (PORCINE) 25000 UT/250ML-% IV SOLN: 1300.0000 [IU]/h | INTRAVENOUS | Status: DC

## 2023-09-01 MED ORDER — SODIUM CHLORIDE 0.9 % IV SOLN: INTRAVENOUS | Status: DC

## 2023-09-01 MED ORDER — SODIUM CHLORIDE 0.9% FLUSH
10.0000 mL | Freq: Two times a day (BID) | INTRAVENOUS | Status: DC
  Administered 2023-09-01 – 2023-09-07 (×10): 10 mL

## 2023-09-01 MED ORDER — POTASSIUM CL IN DEXTROSE 5% 20 MEQ/L IV SOLN
20.0000 meq | INTRAVENOUS | Status: AC
  Administered 2023-09-01 – 2023-09-02 (×2): 20 meq via INTRAVENOUS
  Filled 2023-09-01 (×3): qty 1000

## 2023-09-01 MED ORDER — ONDANSETRON HCL 4 MG PO TABS
4.0000 mg | ORAL_TABLET | Freq: Four times a day (QID) | ORAL | Status: DC | PRN
Start: 2023-09-01 — End: 2023-09-07

## 2023-09-01 MED ORDER — POLYETHYLENE GLYCOL 3350 17 G PO PACK: 17.0000 g | PACK | Freq: Every day | ORAL | Status: DC | PRN

## 2023-09-01 MED ORDER — ALBUTEROL SULFATE (2.5 MG/3ML) 0.083% IN NEBU
2.5000 mg | INHALATION_SOLUTION | RESPIRATORY_TRACT | Status: DC | PRN
  Administered 2023-09-03: 2.5 mg via RESPIRATORY_TRACT
  Filled 2023-09-01: qty 3

## 2023-09-01 MED ORDER — SODIUM CHLORIDE 3 % IN NEBU
4.0000 mL | INHALATION_SOLUTION | Freq: Two times a day (BID) | RESPIRATORY_TRACT | Status: AC
  Administered 2023-09-02 – 2023-09-04 (×3): 4 mL via RESPIRATORY_TRACT
  Filled 2023-09-01 (×6): qty 4

## 2023-09-01 MED ORDER — PIPERACILLIN-TAZOBACTAM 3.375 G IVPB
3.3750 g | Freq: Three times a day (TID) | INTRAVENOUS | Status: DC
  Administered 2023-09-02 – 2023-09-07 (×16): 3.375 g via INTRAVENOUS
  Filled 2023-09-01 (×16): qty 50

## 2023-09-01 MED ORDER — HEPARIN SOD (PORK) LOCK FLUSH 100 UNIT/ML IV SOLN
500.0000 [IU] | Freq: Once | INTRAVENOUS | Status: AC
  Administered 2023-09-01: 500 [IU] via INTRAVENOUS

## 2023-09-01 MED ORDER — SODIUM CHLORIDE 0.9 % IV SOLN: Freq: Once | INTRAVENOUS | Status: AC

## 2023-09-01 MED ORDER — ONDANSETRON HCL 4 MG/2ML IJ SOLN
4.0000 mg | Freq: Once | INTRAMUSCULAR | Status: AC
  Administered 2023-09-01: 4 mg via INTRAVENOUS
  Filled 2023-09-01: qty 2

## 2023-09-01 MED ORDER — SODIUM CHLORIDE 0.9% FLUSH
10.0000 mL | INTRAVENOUS | Status: DC | PRN
  Administered 2023-09-01: 10 mL via INTRAVENOUS

## 2023-09-01 MED ORDER — PANTOPRAZOLE SODIUM 40 MG IV SOLR
40.0000 mg | Freq: Two times a day (BID) | INTRAVENOUS | Status: DC
  Administered 2023-09-01 – 2023-09-07 (×12): 40 mg via INTRAVENOUS
  Filled 2023-09-01 (×12): qty 10

## 2023-09-01 MED ORDER — SODIUM CHLORIDE 0.9% FLUSH
3.0000 mL | Freq: Two times a day (BID) | INTRAVENOUS | Status: DC
Start: 2023-09-01 — End: 2023-09-07
  Administered 2023-09-01 – 2023-09-07 (×5): 3 mL via INTRAVENOUS

## 2023-09-01 MED ORDER — SODIUM CHLORIDE 0.9 % IV SOLN: 250.0000 mL | INTRAVENOUS | Status: AC | PRN

## 2023-09-01 MED ORDER — IOHEXOL 300 MG/ML  SOLN
100.0000 mL | Freq: Once | INTRAMUSCULAR | Status: AC | PRN
  Administered 2023-09-01: 100 mL via INTRAVENOUS

## 2023-09-01 MED ORDER — HYDRALAZINE HCL 20 MG/ML IJ SOLN: 10.0000 mg | INTRAMUSCULAR | Status: DC | PRN

## 2023-09-01 MED ORDER — HEPARIN (PORCINE) 25000 UT/250ML-% IV SOLN
1300.0000 [IU]/h | INTRAVENOUS | Status: DC
  Administered 2023-09-01 – 2023-09-02 (×2): 1300 [IU]/h via INTRAVENOUS
  Filled 2023-09-01 (×2): qty 250

## 2023-09-01 NOTE — ED Provider Triage Note (Signed)
 Emergency Medicine Provider Triage Evaluation Note  Marcus [name] , a 83 y.o. male  was evaluated in triage.  Pt complains of 2-3 wks having emesis.  Review of Systems  Positive: Dark red/brown emesis, small BM this morning, still passing gas, Hx bowel obstruction and colon CA (in remission), currently has pancreatic CA, feels like he can't eat or drink, abd pain (epigastric) Negative: Globus sensation  Physical Exam  There were no vitals taken for this visit. Gen:   Awake, no distress   Resp:  Normal effort  MSK:   Moves extremities without difficulty  Other:    Medical Decision Making  Medically screening exam initiated at 1:21 PM.  Appropriate orders placed.  Marcus [name] was informed that the remainder of the evaluation will be completed by another provider, this initial triage assessment does not replace that evaluation, and the importance of remaining in the ED until their evaluation is complete.  Labs and imaging ordered Pt had CBC and CMP done today already   Dolphus Jenny, PA-C 09/01/23 1745

## 2023-09-01 NOTE — Progress Notes (Signed)
 ED Pharmacy Antibiotic Sign Off An antibiotic consult was received from an ED provider for piperacillin/tazobactam per pharmacy dosing for intra-abdominal infection. A chart review was completed to assess appropriateness.   The following one time order(s) were placed: Piperacillin/tazobactam 3.375 g IV once  Further antibiotic and/or antibiotic pharmacy consults should be ordered by the admitting provider if indicated.   Thank you for allowing pharmacy to be a part of this patient's care.   Cindi Carbon, PharmD 09/01/23 6:50 PM

## 2023-09-01 NOTE — ED Provider Notes (Signed)
  Physical Exam  BP (!) 144/110 (BP Location: Right Arm)   Pulse (!) 59   Temp 97.9 F (36.6 C) (Oral)   Resp 16   SpO2 100%   Physical Exam  Procedures  Procedures  ED Course / MDM    Medical Decision Making Amount and/or Complexity of Data Reviewed Labs: ordered. Radiology: ordered.  Risk Prescription drug management. Decision regarding hospitalization.    Pancreatic adenocarcinoma Nausea, vomiting, decreased intake  CT completed and personally evaluated by me and radiology shows dilated stomach which may represent gastritis or obstruction, there is inflammatory stranding involving the duodenum which may represent duodenitis although tumor involvement is not excluded, cholelithiasis with inflammatory stranding surrounding the gallbladder worrisome for acute cholecystitis, other unchanged findings.  Discussed with Dr. Donell Beers General Surgery who will evaluate. Ordered NG tube. Given zosyn. Admitted to hospitalist service.   Alvira Monday, MD 09/02/23 1141

## 2023-09-01 NOTE — Telephone Encounter (Signed)
 The patient called to report that he is feeling unwell and wishes to come in today for an appointment. He is experiencing generalized itching, which he first noticed a couple of days ago. The patient does not currently take any medication to alleviate the itching. He mentioned that he observes a yellowish appearance in his arm. Additionally, he is feeling very nauseous but has not vomited; he has taken Zofran for the nausea. He is also experiencing difficulty swallowing and has had a loss of appetite that began about two weeks ago.

## 2023-09-01 NOTE — Progress Notes (Signed)
 PHARMACY - ANTICOAGULATION CONSULT NOTE  Pharmacy Consult for heparin Indication:  atrial fibrillation (apixaban on hold)  No Known Allergies  Patient Measurements:   Heparin Dosing Weight: 89 kg  Vital Signs: Temp: 98 F (36.7 C) (02/27 1938) Temp Source: Oral (02/27 1938) BP: 132/70 (02/27 1938) Pulse Rate: 60 (02/27 1938)  Labs: Recent Labs    09/01/23 1032 09/01/23 1447 09/01/23 1454  HGB 9.7* 9.3* 9.9*  HCT 30.3* 30.2* 29.0*  PLT 174 150  --   CREATININE 1.11 1.11 1.20    Estimated Creatinine Clearance: 53.1 mL/min (by C-G formula based on SCr of 1.2 mg/dL).   Medical History: Past Medical History:  Diagnosis Date   Acute meniscal tear of knee left   Arthritis    generalized   AV block 08/01/2018   Benign essential hypertension 01/18/2007   Bronchiectasis with acute exacerbation 06/23/2020   Chest pain, atypical 08/23/2014   Chronic cough 10/07/2014   Coronary artery disease    Degeneration of lumbar intervertebral disc 10/14/2017   Diverticulosis of colon 07/07/2005   Elevated PSA 06/19/2014   GERD (gastroesophageal reflux disease) 01/18/2007   Gout, unspecified 01/18/2007   Hemorrhoids 01/19/2011   Hiatal hernia    History of colonic polyps 01/18/2007   Insomnia, unspecified 08/21/2007   Iron deficiency anemia, unspecified  01/19/2011   Lumbar post-laminectomy syndrome 10/14/2017   Lumbar spondylolysis    Lumbar strain 12/14/2011   Obstructive sleep apnea 01/18/2007   uses CPAP nightly   Paraesophageal hernia    large   Primary osteoarthritis of knee 05/23/2015   Prostate cancer (HCC) 2016   S/P knee replacement 05/23/2015   Sensorineural hearing loss, bilateral    Vitamin B12 deficiency    Vitamin D deficiency 10/28/2009    Medications: Apixaban 5 mg PO BID PTA -Last dose: 2/26 PM  Assessment: Pt is an 29 yoM with PMH significant for pancreatic cancer s/p ERCP for obstructive jaundice. Pt chronically anticoagulated with apixaban for  atrial fibrillation. Anticoagulation being transitioned to heparin - pharmacy to dose.   Today, 09/01/23 CBC: Hgb (9.9) is low, Plt WNL STAT baseline heparin level and aPTT ordered. Anticipate heparin level to be falsely elevated due to DOAC  Goal of Therapy:  Heparin level 0.3-0.7 units/ml aPTT 66-102 seconds Monitor platelets by anticoagulation protocol: Yes   Plan:  Heparin infusion at 1300 units/hr Check 8 hour aPTT and heparin level CBC, heparin level/aPTT daily. Once heparin level and aPTT correlate, can monitor using heparin level only Monitor for signs of bleeding  If heparin needs to be held for any procedures, please provide pharmacy with instructions for when to hold.  Cindi Carbon, PharmD 09/01/23 8:10 PM

## 2023-09-01 NOTE — ED Notes (Signed)
 NG tube attempted twice, in bilat nares, unsuccessful.  Pt refusing any more attempts at this time.

## 2023-09-01 NOTE — Hospital Course (Addendum)
 83 year old male with pancreatic cancer, obstructive jaundice with history of ERCP and stent placement, HTN, CAD, OSA, currently getting chemo comes into the hospital with few days of abdominal pain, nausea, decreased p.o. intake.  Imaging in the ED with a CT scan of the abdomen and pelvis showed cholelithiasis with possible cholecystitis.  CBD stent was in place.  General surgery was consulted and he was admitted to the hospital.    HIDA scan performed 2/28 appeared to be consistent with cholecystitis.  Dr. Loreta Ave with  Interventional radiology was consulted and underwent percutaneous cholecystostomy tube placement 3/1.    Concerning CT imaging findings of possible gastric outlet obstruction,Dr. Chales Abrahams with gastroenterology was consulted GI consulted.  Decision was made to proceed with EGD 3/3 which did indeed reveal a gastric outlet obstruction during which the patient had a duodenal stent placement.    With these interventions patient had substantial improvement in his symptoms.  Patient had a diet initiated which was advanced successfully.  Arranges were made for the patient to be discharged home in improved and stable condition on 09/07/2023.

## 2023-09-01 NOTE — Progress Notes (Addendum)
 Patient Care Team: [name], NP as PCP - General (Family Medicine) Jens Som Madolyn Frieze, MD as PCP - Cardiology (Cardiology) Marcine Matar, MD as Consulting Physician (Urology) Sheran Luz, MD as Consulting Physician (Physical Medicine and Rehabilitation) Venita Lick, MD as Consulting Physician (Orthopedic Surgery)   CHIEF COMPLAINT: Symptom management visit for abdominal pain, nausea/vomiting, and itching, and yellow skin  CURRENT THERAPY: Pending concurrent chemoradiation with Xeloda  INTERVAL HISTORY Marcus [name] presents for symptom management visit.  Has been feeling poorly for a while now, especially in the past 3 weeks, his condition has worsened since his last visit here a week ago.  He experiences sharp abdominal pains, he cannot eat, and "today is a throw up day."  He had bites of a donut this morning and threw it up, vomitus appears dark per his daughter.  He notes food takes forever to digest (has had a history of bariatric surgery).  He is using stool softener/laxative still having small bowel movements mostly air.  Feels bloated. He thought his skin looked yellow, but maybe not, but he does have similar itching to when he had biliary obstruction in the past.  Denies fever or chills.  Notably, my nurse performed depression screening and he scored high PHQ-9 with thoughts of self harm. Upon further discussion, he denies SI today, no plan or access.  ROS  All other systems reviewed and negative  Past Medical History:  Diagnosis Date   Acute meniscal tear of knee left   Arthritis    generalized   AV block 08/01/2018   Benign essential hypertension 01/18/2007   Bronchiectasis with acute exacerbation 06/23/2020   Chest pain, atypical 08/23/2014   Chronic cough 10/07/2014   Coronary artery disease    Degeneration of lumbar intervertebral disc 10/14/2017   Diverticulosis of colon 07/07/2005   Elevated PSA 06/19/2014   GERD (gastroesophageal reflux disease)  01/18/2007   Gout, unspecified 01/18/2007   Hemorrhoids 01/19/2011   Hiatal hernia    History of colonic polyps 01/18/2007   Insomnia, unspecified 08/21/2007   Iron deficiency anemia, unspecified  01/19/2011   Lumbar post-laminectomy syndrome 10/14/2017   Lumbar spondylolysis    Lumbar strain 12/14/2011   Obstructive sleep apnea 01/18/2007   uses CPAP nightly   Paraesophageal hernia    large   Primary osteoarthritis of knee 05/23/2015   Prostate cancer (HCC) 2016   S/P knee replacement 05/23/2015   Sensorineural hearing loss, bilateral    Vitamin B12 deficiency    Vitamin D deficiency 10/28/2009     Past Surgical History:  Procedure Laterality Date   BILIARY BRUSHING  12/13/2022   Procedure: BILIARY BRUSHING;  Surgeon: Iva Boop, MD;  Location: [facility] ENDOSCOPY;  Service: Gastroenterology;;   BILIARY STENT PLACEMENT  12/13/2022   Procedure: BILIARY STENT PLACEMENT;  Surgeon: Iva Boop, MD;  Location: [facility] ENDOSCOPY;  Service: Gastroenterology;;   BILIARY STENT PLACEMENT N/A 02/07/2023   Procedure: BILIARY STENT PLACEMENT;  Surgeon: Lemar Lofty., MD;  Location: [facility];  Service: Gastroenterology;  Laterality: N/A;   BIOPSY  12/23/2022   Procedure: BIOPSY;  Surgeon: Lemar Lofty., MD;  Location: [facility] ENDOSCOPY;  Service: Gastroenterology;;   BRONCHIAL WASHINGS  10/30/2019   Procedure: BRONCHIAL WASHINGS;  Surgeon: [name], DO;  Location: [facility] ENDOSCOPY;  Service: Endoscopy;;   CARDIAC CATHETERIZATION     CATARACT EXTRACTION W/ INTRAOCULAR LENS  IMPLANT, BILATERAL Bilateral    COLONOSCOPY     ENDOSCOPIC RETROGRADE CHOLANGIOPANCREATOGRAPHY (ERCP) WITH PROPOFOL N/A  02/07/2023   Procedure: ENDOSCOPIC RETROGRADE CHOLANGIOPANCREATOGRAPHY (ERCP) WITH PROPOFOL;  Surgeon: Meridee Score Netty Starring., MD;  Location: [facility];  Service: Gastroenterology;  Laterality: N/A;  Stent change   ERCP N/A 12/13/2022   Procedure: ENDOSCOPIC RETROGRADE  CHOLANGIOPANCREATOGRAPHY (ERCP);  Surgeon: Iva Boop, MD;  Location: [facility];  Service: Gastroenterology;  Laterality: N/A;  with stent placement   ESOPHAGOGASTRODUODENOSCOPY     ESOPHAGOGASTRODUODENOSCOPY (EGD) WITH PROPOFOL N/A 12/23/2022   Procedure: ESOPHAGOGASTRODUODENOSCOPY (EGD) WITH PROPOFOL;  Surgeon: Meridee Score Netty Starring., MD;  Location: [facility];  Service: Gastroenterology;  Laterality: N/A;   EUS N/A 12/23/2022   Procedure: UPPER ENDOSCOPIC ULTRASOUND (EUS) RADIAL;  Surgeon: Lemar Lofty., MD;  Location: [facility];  Service: Gastroenterology;  Laterality: N/A;   FINE NEEDLE ASPIRATION  12/23/2022   Procedure: FINE NEEDLE ASPIRATION (FNA) RADIAL;  Surgeon: Lemar Lofty., MD;  Location: [facility];  Service: Gastroenterology;;   IR IMAGING GUIDED PORT INSERTION  01/04/2023   KNEE ARTHROSCOPY Right 2007   KNEE ARTHROSCOPY  07/13/2011   Procedure: ARTHROSCOPY KNEE;  Surgeon: Erasmo Leventhal;  Location: [facility];  Service: Orthopedics;  Laterality: Left;  partial menisectomy with chondrylplasty   KNEE SURGERY Left    LAMINECTOMY AND MICRODISCECTOMY LUMBAR SPINE  MARCH  2008   L3 -  4   LAPAROSCOPIC INCISIONAL / UMBILICAL / VENTRAL HERNIA REPAIR  2006   PACEMAKER IMPLANT N/A 08/02/2018   Procedure: PACEMAKER IMPLANT;  Surgeon: Marinus Maw, MD;  Location: [facility];  Service: Cardiovascular;  Laterality: N/A;   PROSTATE BIOPSY     RADIOACTIVE SEED IMPLANT N/A 02/12/2019   Procedure: RADIOACTIVE SEED IMPLANT/BRACHYTHERAPY IMPLANT;  Surgeon: Marcine Matar, MD;  Location: [facility];  Service: Urology;  Laterality: N/A;  90 MINS   REMOVAL OF STONES  02/07/2023   Procedure: REMOVAL OF Sludge;  Surgeon: Meridee Score Netty Starring., MD;  Location: [facility];  Service: Gastroenterology;;   SHOULDER ARTHROSCOPY Right 05-23-2007   SIGMOID COLECTOMY FOR CANCER  1989   SPACE OAR INSTILLATION N/A 02/12/2019   Procedure: SPACE OAR  INSTILLATION;  Surgeon: Marcine Matar, MD;  Location: [facility];  Service: Urology;  Laterality: N/A;   SPHINCTEROTOMY  12/13/2022   Procedure: SPHINCTEROTOMY;  Surgeon: Iva Boop, MD;  Location: [facility] ENDOSCOPY;  Service: Gastroenterology;;   Francine Graven REMOVAL  02/07/2023   Procedure: STENT REMOVAL;  Surgeon: Lemar Lofty., MD;  Location: [facility];  Service: Gastroenterology;;   TOTAL KNEE ARTHROPLASTY Left 05/23/2015   Procedure: LEFT TOTAL KNEE ARTHROPLASTY;  Surgeon: Eugenia Mcalpine, MD;  Location: [facility];  Service: Orthopedics;  Laterality: Left;   VERTICAL BANDED GASTROPLASTY  1986   VIDEO BRONCHOSCOPY N/A 10/30/2019   Procedure: VIDEO BRONCHOSCOPY WITH BRONICAL ALVEROLAR LAVAGE WITHOUT FLUORO;  Surgeon: [name], DO;  Location: [facility] ENDOSCOPY;  Service: Endoscopy;  Laterality: N/A;     Outpatient Encounter Medications as of 09/01/2023  Medication Sig Note   acetaminophen (TYLENOL) 325 MG tablet Take 2 tablets (650 mg total) by mouth every 6 (six) hours as needed for mild pain (or Fever >/= 101). 02/09/2023: As needed   amLODipine (NORVASC) 10 MG tablet Take 10 mg by mouth daily.    apixaban (ELIQUIS) 5 MG TABS tablet Take 1 tablet (5 mg total) by mouth 2 (two) times daily.    bisacodyl (DULCOLAX) 5 MG EC tablet Take 5 mg by mouth daily as needed for moderate constipation.    Calcium Carb-Cholecalciferol (CALCIUM 600 + D PO) Take 1  tablet by mouth daily. 800 units of vitamin D    capecitabine (XELODA) 500 MG tablet Take 3 tablets (1,500 mg total) by mouth 2 (two) times daily after a meal. Take Monday-Friday. Take only days of radiation.    cyanocobalamin (VITAMIN B12) 500 MCG tablet Take 1 tablet by mouth 2 (two) times daily.    docusate sodium (COLACE) 100 MG capsule Take 100 mg by mouth 2 (two) times daily.    Eszopiclone 3 MG TABS Take 1 tablet (3 mg total) by mouth at bedtime. Take immediately before bedtime    fluticasone (FLONASE) 50 MCG/ACT nasal spray Place 2 sprays  into both nostrils daily.    glycerin adult 2 g suppository Place 1 suppository rectally as needed for constipation.    ipratropium (ATROVENT) 0.03 % nasal spray Place 2 sprays into both nostrils every 12 (twelve) hours.    lidocaine-prilocaine (EMLA) cream Apply 1 Application topically as needed. 02/09/2023: As needed   mirabegron ER (MYRBETRIQ) 50 MG TB24 tablet Take 1 tablet by mouth daily.    mirtazapine (REMERON) 15 MG tablet Take 1 tablet (15 mg total) by mouth at bedtime.    ondansetron (ZOFRAN) 4 MG tablet Take 1 tablet (4 mg total) by mouth every 4 (four) hours as needed for nausea or vomiting.    oxybutynin (DITROPAN) 5 MG tablet Take 2.5 mg by mouth 2 (two) times daily.    oxyCODONE (ROXICODONE) 5 MG immediate release tablet Take 1 tablet (5 mg total) by mouth every 6 (six) hours as needed for severe pain (pain score 7-10).    polyethylene glycol powder (GLYCOLAX/MIRALAX) 17 GM/SCOOP powder Mix 4 capfuls in a 32 oz Gatorade, Can repeat the next day for a satisfactory bowel movement. THEN one capful daily for maintenance    Potassium Gluconate 550 MG TABS Take 550 mg by mouth daily.    Respiratory Therapy Supplies (FLUTTER) DEVI 1 each by Does not apply route in the morning and at bedtime. 02/09/2023: As needed   rosuvastatin (CRESTOR) 20 MG tablet Take 20 mg by mouth daily.    sodium chloride HYPERTONIC 3 % nebulizer solution Take by nebulization as needed for other. 02/09/2023: As needed   tamsulosin (FLOMAX) 0.4 MG CAPS capsule Take 1 capsule by mouth daily.    traZODone (DESYREL) 50 MG tablet Take 1 tablet by mouth at bedtime as needed.    valsartan-hydrochlorothiazide (DIOVAN-HCT) 160-25 MG tablet Take 1 tablet by mouth daily.    No facility-administered encounter medications on file as of 09/01/2023.     Today's Vitals   09/01/23 1017 09/01/23 1022 09/01/23 1026  BP: (!) 142/70    Pulse: (!) 59 60   Resp: 18    Temp: 97.8 F (36.6 C)    TempSrc: Temporal    SpO2: 100%     Weight: 217 lb 4.8 oz (98.6 kg)    PainSc:   0-No pain   Body mass index is 33.04 kg/m.   PHYSICAL EXAM GENERAL:alert, no distress, but ill appearing and uncomfortable SKIN: no rash or juandice  EYES: sclera clear NECK: without mass LUNGS: clear with normal breathing effort HEART: regular rate & rhythm, no lower extremity edema ABDOMEN: abdomen distended and firm, hyperactive BS, ttp in the upper abdomen NEURO: alert & oriented x 3 with fluent speech,  PAC without erythema    CBC    Component Value Date/Time   WBC 3.8 (L) 09/01/2023 1032   WBC 4.7 08/13/2023 1926   RBC 3.54 (L) 09/01/2023 1032  HGB 9.7 (L) 09/01/2023 1032   HCT 30.3 (L) 09/01/2023 1032   PLT 174 09/01/2023 1032   MCV 85.6 09/01/2023 1032   MCH 27.4 09/01/2023 1032   MCHC 32.0 09/01/2023 1032   RDW 16.7 (H) 09/01/2023 1032   LYMPHSABS 0.7 09/01/2023 1032   MONOABS 0.5 09/01/2023 1032   EOSABS 0.1 09/01/2023 1032   BASOSABS 0.0 09/01/2023 1032     CMP     Component Value Date/Time   NA 137 09/01/2023 1032   NA 144 01/18/2023 1209   K 3.8 09/01/2023 1032   CL 100 09/01/2023 1032   CO2 29 09/01/2023 1032   GLUCOSE 125 (H) 09/01/2023 1032   BUN 25 (H) 09/01/2023 1032   BUN 20 01/18/2023 1209   CREATININE 1.11 09/01/2023 1032   CREATININE 1.24 01/01/2011 1609   CALCIUM 9.0 09/01/2023 1032   PROT 6.6 09/01/2023 1032   ALBUMIN 3.6 09/01/2023 1032   AST 19 09/01/2023 1032   ALT 15 09/01/2023 1032   ALKPHOS 96 09/01/2023 1032   BILITOT 0.4 09/01/2023 1032   GFRNONAA >60 09/01/2023 1032   GFRAA >60 02/07/2019 1446     ASSESSMENT & PLAN:  Pancreas cancer CT abdomen/pelvis 12/03/2022-suspected pancreas mass in the proximal body measuring 2 cm with probable mass effect on the superior aspect of the SMV, multiple hypodense liver lesions, many are new from a remote CT-indeterminate, 2 cystic lesions in the pancreas tail-nonspecific prior bariatric surgery ERCP 12/13/2022-malignant appearing common  bile duct stricture, plastic stent placed, brushing cytology-no malignant cells EUS 12/23/2022-30 x 33 mm pancreas head mass with invasion of the SMV, no malignant appearing lymph nodes, 4 cysts in the visualized liver, FNA-adenocarcinoma Cycle 1 gemcitabine and abraxane 01/07/2023 CTs 01/09/2023-multiple low-attenuation liver lesions unchanged, largest of which are clearly benign fluid attenuation cyst, others more ill-defined and incompletely characterized suspicious for small metastases.  Similar appearance of hypodense mass in the pancreatic head.  Distended gallbladder with mild gallbladder wall thickening and adjacent pericholecystic fat stranding.  Sludge in the gallbladder fundus.  Common bile duct stent in place with diminished, persistent intrahepatic biliary ductal dilatation.  Negative for pulmonary embolism.  Acute appearing heterogeneous airspace opacity of the superior segment left lower lobe consistent with infection or aspiration. Cycle 2 gemcitabine and abraxane 01/21/2023, gemcitabine dose reduced due to neutropenia following cycle 1 Cycle 3 gemcitabine/Abraxane 02/04/2023 Cycle 4 gemcitabine/Abraxane 02/17/2023 Cycle 5 gemcitabine/Abraxane 03/04/2023 Cycle 6 gemcitabine/Abraxane 03/17/2023 CT abdomen/pelvis 03/28/2023-stable pancreas mass, multiple small hypoattenuating liver lesions (review of CTs at the GI tumor conference is most consistent with liver metastases-some smaller compared to the baseline CT) Cycle 7 gemcitabine/Abraxane 04/01/2023 Cycle 8 gemcitabine/Abraxane 04/15/2023 Cycle 9 gemcitabine/Abraxane 04/29/2023 Treatment held per patient request 05/12/2023 Cycle 10 gemcitabine/Abraxane 05/27/2023 Cycle 11 gemcitabine/Abraxane 06/10/2023 Cycle 12 gemcitabine/Abraxane 06/24/2023 CTs 07/01/2023-no significant difference in size of the mass in the pancreatic head.  Redemonstration of multiple hypoattenuating liver lesions essentially unchanged and favored to represent simple  cysts. 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, cholelithiasis and mild pericholecystic inflammatory stranding Plan for 5-1/2-week course of  radiation plus concurrent Xeloda, start date 09/07/2023 Biliary obstruction secondary to 1-status post ERCP/stent placement 12/13/2022 Coronary artery disease Atrial fibrillation Gout Sleep apnea Status post bariatric surgery Hypertension Bronchiectasis Gastroesophageal reflux disease History of colon cancer in his 40s Complete heart block-pacemaker placed Admission 01/09/2023 with a fever and failure to thrive, CT chest with evidence of left lung pneumonia, CT abdomen/pelvis with a distended gallbladder and adjacent fat stranding Anemia 08/07/2023: Acute onset nausea/vomiting and abdominal pain-resolved after Zofran/morphine in the emergency room, CT without acute findings Depression - he has high score PHQ-9 today, no SI, plan, or access today. He declined referral to mental health provider. Has a trusting relationship with PCP and agrees to call him soon to discuss his mental health    Disposition:  Marcus [name] appears ill, with progressive abdominal pain, n/v, and decreased bowel movements. He is not tolerating po and vomiting in clinic. Exam shows distended and firm abdomen with hyperactive bowel sounds. We are recommending him for direct admission to r/o gastric outlet or upper GI obstruction. The initial concern  for biliary obstruction is less likely given normal LFTs/Tbili today.   There are currently no available beds. The hospitalist recommends to go to [facility] [place] ED. He was given IV Zofran in clinic. We planned for a CT but he cannot tolerate po contrast, and given that he will be going to ED, will hold off on GI xray and defer to ED. We will inform GI team of his pending admission.   Patient and family agree with the plan. Patient seen with Dr. Truett Perna.    Orders Placed This Encounter  Procedures   CT ABDOMEN PELVIS W CONTRAST    Standing Status:   Future    Expected Date:   09/01/2023    Expiration Date:   08/31/2024    If indicated for the ordered procedure, I authorize the administration of contrast media per Radiology protocol:   Yes    Does the patient have a contrast media/X-ray dye allergy?:   No    Preferred imaging location?:   [facility]    If indicated for the ordered procedure, I authorize the administration of oral contrast media per Radiology protocol:   Yes   DG Abd 2 Views    Standing Status:   Future    Expected Date:   09/01/2023    Expiration Date:   08/31/2024    Reason for Exam (SYMPTOM  OR DIAGNOSIS REQUIRED):   pancreatic cancer with acute n/v, r/o obstruction    Preferred imaging location?:   [facility]      All questions were answered. The patient knows to call the clinic with any problems, questions or concerns. No barriers to learning were detected.  Santiago Glad, NP-C 09/01/2023  This was a shared visit with Santiago Glad.  Marcus [name] was interviewed and examined.  He is scheduled to begin concurrent capecitabine and radiation on 09/07/2023 for treatment of locally advanced pancreas cancer.  He presents today with abdominal distention and intractable nausea/vomiting.  We are referring him to the emergency room for hospital admission.  He is followed by [facility] gastroenterology.  I was present for greater than 50% of today's visit.  I performed Medical  Decision Making.  Mancel Bale, MD

## 2023-09-01 NOTE — Consult Note (Signed)
 Reason for Consult:gastric outlet obstruction Referring Physician: Dian [name] is an 83 y.o. male.  HPI:  Patient was sent to the emergency department after being evaluated in the medical oncology clinic earlier today.  Patient has had several weeks of progressive difficulty eating with nausea and vomiting today.  He states that he feels like the food is not digesting and is not really going anywhere.  He is continuing to pass gas but is only having very small amounts of brown liquid stool when he passes some gas.  He denies abdominal pain.  He denies fevers or chills or jaundice.  He has a history of adenocarcinoma of the pancreatic head which was diagnosed last June.  He has been receiving chemotherapy.  The clinical stage at diagnosis was clinical T2 N0.  He had a stent placed in the common bile duct at that time.  He has had several small liver lesions.  Some of these do appear to be cysts but others are consistent with small metastases.  These have not been biopsied.  In January 2025 there was noted to be encasement of the SMV with tumor contacting the SMA.  At that time there was some gallbladder wall thickening with some sludge and small gallstones.  Several days later he was noted to have progressive narrowing of the main portal vein with near occlusion cholelithiasis and some mild pericholecystic stranding.  Course of radiation was planned to start March 5.   He has not had any significant growth of the mass visible on scans, but today his scan showed what appeared to be inflammation around the gallbladder and duodenitis.  The stomach was noted to be extremely distended and full of debris.  The patient also has a history of coronary artery disease and atrial fibrillation.  He has complete heart block with a pacemaker.  He also has history of bariatric surgery.  Patient has a remote history of colon cancer 30 to 40 years ago.  Past Medical History:  Diagnosis Date   Acute  meniscal tear of knee left   Arthritis    generalized   AV block 08/01/2018   Benign essential hypertension 01/18/2007   Bronchiectasis with acute exacerbation 06/23/2020   Chest pain, atypical 08/23/2014   Chronic cough 10/07/2014   Coronary artery disease    Degeneration of lumbar intervertebral disc 10/14/2017   Diverticulosis of colon 07/07/2005   Elevated PSA 06/19/2014   GERD (gastroesophageal reflux disease) 01/18/2007   Gout, unspecified 01/18/2007   Hemorrhoids 01/19/2011   Hiatal hernia    History of colonic polyps 01/18/2007   Insomnia, unspecified 08/21/2007   Iron deficiency anemia, unspecified  01/19/2011   Lumbar post-laminectomy syndrome 10/14/2017   Lumbar spondylolysis    Lumbar strain 12/14/2011   Obstructive sleep apnea 01/18/2007   uses CPAP nightly   Paraesophageal hernia    large   Primary osteoarthritis of knee 05/23/2015   Prostate cancer (HCC) 2016   S/P knee replacement 05/23/2015   Sensorineural hearing loss, bilateral    Vitamin B12 deficiency    Vitamin D deficiency 10/28/2009    Past Surgical History:  Procedure Laterality Date   BILIARY BRUSHING  12/13/2022   Procedure: BILIARY BRUSHING;  Surgeon: Iva Boop, MD;  Location: [facility] ENDOSCOPY;  Service: Gastroenterology;;   BILIARY STENT PLACEMENT  12/13/2022   Procedure: BILIARY STENT PLACEMENT;  Surgeon: Iva Boop, MD;  Location: [facility] ENDOSCOPY;  Service: Gastroenterology;;   BILIARY STENT PLACEMENT N/A 02/07/2023   Procedure:  BILIARY STENT PLACEMENT;  Surgeon: Meridee Score Netty Starring., MD;  Location: [facility] ENDOSCOPY;  Service: Gastroenterology;  Laterality: N/A;   BIOPSY  12/23/2022   Procedure: BIOPSY;  Surgeon: Lemar Lofty., MD;  Location: [facility] ENDOSCOPY;  Service: Gastroenterology;;   BRONCHIAL WASHINGS  10/30/2019   Procedure: BRONCHIAL WASHINGS;  Surgeon: [name], DO;  Location: [facility] ENDOSCOPY;  Service: Endoscopy;;   CARDIAC CATHETERIZATION     CATARACT EXTRACTION W/  INTRAOCULAR LENS  IMPLANT, BILATERAL Bilateral    COLONOSCOPY     ENDOSCOPIC RETROGRADE CHOLANGIOPANCREATOGRAPHY (ERCP) WITH PROPOFOL N/A 02/07/2023   Procedure: ENDOSCOPIC RETROGRADE CHOLANGIOPANCREATOGRAPHY (ERCP) WITH PROPOFOL;  Surgeon: Lemar Lofty., MD;  Location: [facility] ENDOSCOPY;  Service: Gastroenterology;  Laterality: N/A;  Stent change   ERCP N/A 12/13/2022   Procedure: ENDOSCOPIC RETROGRADE CHOLANGIOPANCREATOGRAPHY (ERCP);  Surgeon: Iva Boop, MD;  Location: [facility] ENDOSCOPY;  Service: Gastroenterology;  Laterality: N/A;  with stent placement   ESOPHAGOGASTRODUODENOSCOPY     ESOPHAGOGASTRODUODENOSCOPY (EGD) WITH PROPOFOL N/A 12/23/2022   Procedure: ESOPHAGOGASTRODUODENOSCOPY (EGD) WITH PROPOFOL;  Surgeon: Meridee Score Netty Starring., MD;  Location: [facility] ENDOSCOPY;  Service: Gastroenterology;  Laterality: N/A;   EUS N/A 12/23/2022   Procedure: UPPER ENDOSCOPIC ULTRASOUND (EUS) RADIAL;  Surgeon: Lemar Lofty., MD;  Location: [facility] ENDOSCOPY;  Service: Gastroenterology;  Laterality: N/A;   FINE NEEDLE ASPIRATION  12/23/2022   Procedure: FINE NEEDLE ASPIRATION (FNA) RADIAL;  Surgeon: Lemar Lofty., MD;  Location: [facility] ENDOSCOPY;  Service: Gastroenterology;;   IR IMAGING GUIDED PORT INSERTION  01/04/2023   KNEE ARTHROSCOPY Right 2007   KNEE ARTHROSCOPY  07/13/2011   Procedure: ARTHROSCOPY KNEE;  Surgeon: Erasmo Leventhal;  Location: [facility];  Service: Orthopedics;  Laterality: Left;  partial menisectomy with chondrylplasty   KNEE SURGERY Left    LAMINECTOMY AND MICRODISCECTOMY LUMBAR SPINE  MARCH  2008   L3 -  4   LAPAROSCOPIC INCISIONAL / UMBILICAL / VENTRAL HERNIA REPAIR  2006   PACEMAKER IMPLANT N/A 08/02/2018   Procedure: PACEMAKER IMPLANT;  Surgeon: Marinus Maw, MD;  Location: [facility]VASIVE [facility];  Service: Cardiovascular;  Laterality: N/A;   PROSTATE BIOPSY     RADIOACTIVE SEED IMPLANT N/A 02/12/2019   Procedure: RADIOACTIVE SEED  IMPLANT/BRACHYTHERAPY IMPLANT;  Surgeon: Marcine Matar, MD;  Location: [facility] ORS;  Service: Urology;  Laterality: N/A;  90 MINS   REMOVAL OF STONES  02/07/2023   Procedure: REMOVAL OF Sludge;  Surgeon: Meridee Score Netty Starring., MD;  Location: [facility] ENDOSCOPY;  Service: Gastroenterology;;   SHOULDER ARTHROSCOPY Right 05-23-2007   SIGMOID COLECTOMY FOR CANCER  1989   SPACE OAR INSTILLATION N/A 02/12/2019   Procedure: SPACE OAR INSTILLATION;  Surgeon: Marcine Matar, MD;  Location: [facility] ORS;  Service: Urology;  Laterality: N/A;   SPHINCTEROTOMY  12/13/2022   Procedure: SPHINCTEROTOMY;  Surgeon: Iva Boop, MD;  Location: [facility] ENDOSCOPY;  Service: Gastroenterology;;   Francine Graven REMOVAL  02/07/2023   Procedure: STENT REMOVAL;  Surgeon: Lemar Lofty., MD;  Location: [facility]OSCOPY;  Service: Gastroenterology;;   TOTAL KNEE ARTHROPLASTY Left 05/23/2015   Procedure: LEFT TOTAL KNEE ARTHROPLASTY;  Surgeon: Eugenia Mcalpine, MD;  Location: [facility] ORS;  Service: Orthopedics;  Laterality: Left;   VERTICAL BANDED GASTROPLASTY  1986   VIDEO BRONCHOSCOPY N/A 10/30/2019   Procedure: VIDEO BRONCHOSCOPY WITH BRONICAL ALVEROLAR LAVAGE WITHOUT FLUORO;  Surgeon: [name], DO;  Location: [facility] ENDOSCOPY;  Service: Endoscopy;  Laterality: N/A;    Family History  Problem Relation Age of Onset   Stroke  Paternal Grandfather    Heart disease Paternal Grandfather    Esophagitis Father        died from perforated esophagus   Breast cancer Mother    Colon cancer Maternal Grandmother        questionable   Esophageal cancer Neg Hx    Prostate cancer Neg Hx    Stomach cancer Neg Hx     Social History:  reports that he quit smoking about 49 years ago. His smoking use included cigarettes. He started smoking about 69 years ago. He has a 40 pack-year smoking history. He has never used smokeless tobacco. He reports current alcohol use. He reports that he does not use drugs.  Allergies: No Known Allergies  Medications:   Current Meds  Medication Sig   acetaminophen (TYLENOL) 325 MG tablet Take 2 tablets (650 mg total) by mouth every 6 (six) hours as needed for mild pain (or Fever >/= 101).   allopurinol (ZYLOPRIM) 300 MG tablet Take 300 mg by mouth daily.   amLODipine (NORVASC) 10 MG tablet Take 10 mg by mouth daily.   apixaban (ELIQUIS) 5 MG TABS tablet Take 1 tablet (5 mg total) by mouth 2 (two) times daily.   bisacodyl (DULCOLAX) 5 MG EC tablet Take 10 mg by mouth daily as needed for moderate constipation.   capecitabine (XELODA) 500 MG tablet Take 3 tablets (1,500 mg total) by mouth 2 (two) times daily after a meal. Take Monday-Friday. Take only days of radiation.   Carboxymethylcellulose Sod PF 0.5 % SOLN Apply 1 drop to eye in the morning, at noon, in the evening, and at bedtime.   cyanocobalamin (VITAMIN B12) 500 MCG tablet Take 1 tablet by mouth 2 (two) times daily.   fluticasone (FLONASE) 50 MCG/ACT nasal spray Place 2 sprays into both nostrils daily.   glycerin adult 2 g suppository Place 1 suppository rectally as needed for constipation.   ipratropium (ATROVENT) 0.03 % nasal spray Place 2 sprays into both nostrils every 12 (twelve) hours. (Patient taking differently: Place 2 sprays into both nostrils 2 (two) times daily as needed for rhinitis.)   lidocaine-prilocaine (EMLA) cream Apply 1 Application topically as needed.   mirabegron ER (MYRBETRIQ) 50 MG TB24 tablet Take 1 tablet by mouth daily.   oxybutynin (DITROPAN) 5 MG tablet Take 5 mg by mouth 2 (two) times daily as needed (urinary incontinence).   polyethylene glycol powder (GLYCOLAX/MIRALAX) 17 GM/SCOOP powder Mix 4 capfuls in a 32 oz Gatorade, Can repeat the next day for a satisfactory bowel movement. THEN one capful daily for maintenance   Potassium Gluconate 550 MG TABS Take 550 mg by mouth daily.   rosuvastatin (CRESTOR) 20 MG tablet Take 20 mg by mouth daily.   sodium chloride HYPERTONIC 3 % nebulizer solution Take by nebulization as  needed for other.   tamsulosin (FLOMAX) 0.4 MG CAPS capsule Take 1 capsule by mouth daily.   traZODone (DESYREL) 50 MG tablet Take 1 tablet by mouth at bedtime as needed for sleep.     Results for orders placed or performed during the hospital encounter of 09/01/23 (from the past 48 hours)  Comprehensive metabolic panel     Status: Abnormal   Collection Time: 09/01/23  2:47 PM  Result Value Ref Range   Sodium 134 (L) 135 - 145 mmol/L   Potassium 3.6 3.5 - 5.1 mmol/L   Chloride 100 98 - 111 mmol/L   CO2 26 22 - 32 mmol/L   Glucose, Bld 111 (H) 70 -  99 mg/dL    Comment: Glucose reference range applies only to samples taken after fasting for at least 8 hours.   BUN 24 (H) 8 - 23 mg/dL   Creatinine, Ser 1.30 0.61 - 1.24 mg/dL   Calcium 8.7 (L) 8.9 - 10.3 mg/dL   Total Protein 6.3 (L) 6.5 - 8.1 g/dL   Albumin 3.1 (L) 3.5 - 5.0 g/dL   AST 22 15 - 41 U/L   ALT 19 0 - 44 U/L   Alkaline Phosphatase 94 38 - 126 U/L   Total Bilirubin 0.4 0.0 - 1.2 mg/dL   GFR, Estimated >86 >57 mL/min    Comment: (NOTE) Calculated using the CKD-EPI Creatinine Equation (2021)    Anion gap 8 5 - 15    Comment: Performed at [facility], [address], [place], [place] [address]  Lipase, blood     Status: None   Collection Time: 09/01/23  2:47 PM  Result Value Ref Range   Lipase 32 11 - 51 U/L    Comment: Performed at [facility], [address], [place], [place] [address]  CBC with Diff     Status: Abnormal   Collection Time: 09/01/23  2:47 PM  Result Value Ref Range   WBC 3.6 (L) 4.0 - 10.5 K/uL   RBC 3.45 (L) 4.22 - 5.81 MIL/uL   Hemoglobin 9.3 (L) 13.0 - 17.0 g/dL   HCT 41.3 (L) 24.4 - 01.0 %   MCV 87.5 80.0 - 100.0 fL   MCH 27.0 26.0 - 34.0 pg   MCHC 30.8 30.0 - 36.0 g/dL   RDW 27.2 (H) 53.6 - 64.4 %   Platelets 150 150 - 400 K/uL   nRBC 0.0 0.0 - 0.2 %   Neutrophils Relative % 69 %   Neutro Abs 2.5 1.7 - 7.7 K/uL   Lymphocytes Relative 16 %   Lymphs  Abs 0.6 (L) 0.7 - 4.0 K/uL   Monocytes Relative 10 %   Monocytes Absolute 0.4 0.1 - 1.0 K/uL   Eosinophils Relative 3 %   Eosinophils Absolute 0.1 0.0 - 0.5 K/uL   Basophils Relative 1 %   Basophils Absolute 0.0 0.0 - 0.1 K/uL   Immature Granulocytes 1 %   Abs Immature Granulocytes 0.02 0.00 - 0.07 K/uL    Comment: Performed at [facility], [address] [place] [address], [place], [place] [address]  I-stat chem 8, ED (not at [facility], [facility] or [facility])     Status: Abnormal   Collection Time: 09/01/23  2:54 PM  Result Value Ref Range   Sodium 138 135 - 145 mmol/L   Potassium 3.7 3.5 - 5.1 mmol/L   Chloride 101 98 - 111 mmol/L   BUN 22 8 - 23 mg/dL   Creatinine, Ser 2.59 0.61 - 1.24 mg/dL   Glucose, Bld 563 (H) 70 - 99 mg/dL    Comment: Glucose reference range applies only to samples taken after fasting for at least 8 hours.   Calcium, Ion 1.19 1.15 - 1.40 mmol/L   TCO2 26 22 - 32 mmol/L   Hemoglobin 9.9 (L) 13.0 - 17.0 g/dL   HCT 87.5 (L) 64.3 - 32.9 %  Urinalysis, Routine w reflex microscopic -Urine, Clean Catch     Status: None   Collection Time: 09/01/23  3:40 PM  Result Value Ref Range   Color, Urine YELLOW YELLOW   APPearance CLEAR CLEAR   Specific Gravity, Urine 1.026 1.005 - 1.030   pH 7.0 5.0 -  8.0   Glucose, UA NEGATIVE NEGATIVE mg/dL   Hgb urine dipstick NEGATIVE NEGATIVE   Bilirubin Urine NEGATIVE NEGATIVE   Ketones, ur NEGATIVE NEGATIVE mg/dL   Protein, ur NEGATIVE NEGATIVE mg/dL   Nitrite NEGATIVE NEGATIVE   Leukocytes,Ua NEGATIVE NEGATIVE    Comment: Performed at [facility], [address], [place], [place] [address]   *Note: Due to a large number of results and/or encounters for the requested time period, some results have not been displayed. A complete set of results can be found in Results Review.    CT ABDOMEN PELVIS W CONTRAST Result Date: 09/01/2023 CLINICAL DATA:  Evaluate for bowel obstruction. EXAM: CT ABDOMEN AND PELVIS WITH CONTRAST  TECHNIQUE: Multidetector CT imaging of the abdomen and pelvis was performed using the standard protocol following bolus administration of intravenous contrast. RADIATION DOSE REDUCTION: This exam was performed according to the departmental dose-optimization program which includes automated exposure control, adjustment of the mA and/or kV according to patient size and/or use of iterative reconstruction technique. CONTRAST:  OMNIPAQUE IOHEXOL 300 MG/ML  SOLN COMPARISON:  CT abdomen and pelvis 08/14/2023. FINDINGS: Lower chest: No acute abnormality. Hepatobiliary: Common bile duct stent is unchanged in position. Pneumobilia is unchanged. There are few hypodense lesions in the liver measuring up to 16 mm which are unchanged from prior. Gallstones are present. There is inflammatory stranding surrounding the gallbladder. Pancreas: Ill-defined hypodense pancreatic head mass appears grossly unchanged. The body and tail of the pancreas are atrophic with ductal dilatation similar to prior. There is no new acute inflammation or fluid collection. Spleen: Normal in size without focal abnormality. Adrenals/Urinary Tract: 18 mm left renal cyst is again noted. There are punctate calcifications in the right kidney. There is no hydronephrosis. Adrenal glands and bladder are within normal limits. Stomach/Bowel: Sigmoid colon anastomosis is again noted. There are scattered colonic diverticula. The appendix and small bowel are within normal limits. Partial gastrectomy changes are present. The stomach is dilated with air-fluid level. There some wall thickening of the duodenal with mild surrounding inflammation. Vascular/Lymphatic: Aorta and IVC are normal in size. There is narrowing or occlusion of the portal vein near the portal confluence, unchanged. No enlarged lymph nodes are identified. Reproductive: Prostate radiotherapy seeds are present. Other: There surgical clips in the anterior abdominal wall. There is mild inflammatory  stranding in the subcutaneous tissues of the anterior abdominal wall. There are small fat containing bilateral inguinal hernias. Musculoskeletal: Multilevel degenerative changes affect the spine. IMPRESSION: 1. Cholelithiasis with inflammatory stranding surrounding the gallbladder. Findings are worrisome for acute cholecystitis. 2. The stomach is dilated which may represent gastritis or obstruction. There is inflammatory stranding and wall thickening involving the duodenal which may represent duodenitis. Tumor involvement not excluded. 3. Common bile duct stent is unchanged in position. 4. Stable hypodense lesions in the liver. 5. Stable pancreatic head mass. 6. Stable narrowing or occlusion of the portal vein near the portal confluence. 7. Nonobstructing right renal stones. 8. Colonic diverticulosis. 9. Mild inflammatory stranding in the subcutaneous tissues of the anterior abdominal wall. Correlate clinically for cellulitis. Electronically Signed   By: Darliss Cheney M.D.   On: 09/01/2023 17:46   DG Chest Portable 1 View Result Date: 09/01/2023 CLINICAL DATA:  Weakness. EXAM: PORTABLE CHEST 1 VIEW COMPARISON:  February 27, 2024. FINDINGS: Stable cardiomediastinal silhouette. Left-sided pacemaker is unchanged. Right internal jugular Port-A-Cath is unchanged. Hypoinflation of the lungs is noted with probable minimal bibasilar subsegmental atelectasis. Bony thorax is unremarkable. IMPRESSION: Hypoinflation of  the lungs with probable minimal bibasilar subsegmental atelectasis. Electronically Signed   By: Lupita Raider M.D.   On: 09/01/2023 17:05    Review of Systems  Constitutional:  Positive for appetite change and fatigue. Negative for chills, diaphoresis and fever.  HENT: Negative.    Eyes: Negative.   Respiratory: Negative.    Gastrointestinal:  Positive for abdominal distention, constipation, nausea and vomiting. Negative for diarrhea.  Endocrine: Negative.   Genitourinary: Negative.    Musculoskeletal: Negative.   Skin: Negative.   Neurological: Negative.   Hematological: Negative.   Psychiatric/Behavioral:  Positive for dysphoric mood.   All other systems reviewed and are negative.   Blood pressure 128/68, pulse 60, temperature 98 F (36.7 C), temperature source Oral, resp. rate 14, SpO2 97%.  Physical Exam Vitals reviewed.  Constitutional:      General: He is not in acute distress.    Appearance: He is well-developed. He is ill-appearing (chronically ill appearing. no acute distress).  HENT:     Head: Normocephalic and atraumatic.     Mouth/Throat:     Mouth: Mucous membranes are moist.  Eyes:     Extraocular Movements: Extraocular movements intact.     Pupils: Pupils are equal, round, and reactive to light.  Cardiovascular:     Rate and Rhythm: Tachycardia present. Rhythm irregular.  Abdominal:     General: Abdomen is protuberant. There is distension.     Palpations: Abdomen is soft. There is no shifting dullness, fluid wave, hepatomegaly, splenomegaly, mass or pulsatile mass.     Tenderness: There is no abdominal tenderness.     Hernia: No hernia is present.  Skin:    General: Skin is warm and dry.     Capillary Refill: Capillary refill takes 2 to 3 seconds.     Coloration: Skin is pale. Skin is not cyanotic, jaundiced or mottled.     Findings: No erythema or rash.  Neurological:     General: No focal deficit present.     Mental Status: He is alert and oriented to person, place, and time.  Psychiatric:        Mood and Affect: Mood normal. Mood is not anxious or depressed.        Behavior: Behavior normal.    \  Assessment/Plan: Pancreatic adenocarcinoma, started as cT2N0 12/2022, receiving gem/abraxane Gastric outlet obstruction Duodenitis Cholecystitis Portal vein occlusion  Recommend NPO for gastric outlet obstruction. NGT attempted.  Patient has significant debris in the stomach.   Duodenitis likely secondary to tumor involvement or  related to gallbladder. Will get HIDA to eval for acute cholecystitis.   Consider perc chole tube rather than lap chole if positive.   Regardless of gallbladder, I don't think gastric outlet obstruction is related to this.  It appears to be from tumor progression.  Consider Gi consult for possible endoscopy/stent.  Recommend palliative care consult.  Gastric outlet obstruction is generally a later stage issue with pancreatic cancer.  CA 19-9 has been rising over the last few months.   Maudry Diego, MD, FACS, FSSO Surgical Oncology and General Surgery [facility] Surgery, [place] [phone] for weekday/non holidays Check [email or web address] for coverage night/weekend/holidays

## 2023-09-01 NOTE — Progress Notes (Signed)
 Pharmacy Antibiotic Note  Marcus [name] is a 83 y.o. male admitted on 09/01/2023 with  intra-abdominal infection .  PMH significant for pancreatic cancer, obstructive jaundice s/p ERCP.  Pharmacy has been consulted for piperacillin/tazobactam dosing.  Today, 09/01/23 WBC low SCr WNL, CrCl ~50 mL/min  Plan: Piperacillin/tazobactam 3.375 g IV q8h EI Follow renal function     Temp (24hrs), Avg:97.8 F (36.6 C), Min:97.5 F (36.4 C), Max:98 F (36.7 C)  Recent Labs  Lab 09/01/23 1032 09/01/23 1447 09/01/23 1454  WBC 3.8* 3.6*  --   CREATININE 1.11 1.11 1.20    Estimated Creatinine Clearance: 53.1 mL/min (by C-G formula based on SCr of 1.2 mg/dL).    No Known Allergies  Antimicrobials this admission: Piperacillin/tazobactam 2/27 >>   Microbiology results: None  Cindi Carbon, PharmD 09/01/2023 7:55 PM

## 2023-09-01 NOTE — Progress Notes (Signed)
 PHQ-20, wanting to harm self, currently seeing a therapist/treatment, Provider made aware.

## 2023-09-01 NOTE — Progress Notes (Signed)
 D/C w/daughter in stable condition to car via W/C. Going to [facility] emergency room

## 2023-09-01 NOTE — Progress Notes (Signed)
 TC to [facility] ED spoke with [employer] RN gave report on Pt informed her Dr Truett Perna spoke with Hospitalist who agreed Pt needs to come straight to [facility]. Informed Charge  of Pt's history and Pt will be admitted to r/o obstruction. Pt was left accessed and received IV zofran with some fluids.Pt's family will bring him over to ER. Charge verbalized understanding. Awaiting Pt to arrive to ER.

## 2023-09-01 NOTE — ED Notes (Signed)
 X ray in room.

## 2023-09-01 NOTE — Patient Instructions (Signed)
 Central Line in Adults: What to Expect A central line is a soft tube that is put into a vein so that it goes to a large vein above your heart. It can be used to: Give you medicine or fluids. Give you nutrients. Take blood or give you blood for testing or treatments. Give chemotherapy or dialysis. Provide IV access if veins in your hands or arms are difficult to use. Types of central lines There are four main types of central lines: Peripherally inserted central catheter (PICC) line. This type is usually put in the upper arm and goes up the arm to the vein above your heart. This is used for one week or longer. Tunneled central line. This type is placed in a large vein in the neck, chest, or groin. It is tunneled under the skin and brought out through a small cut in your skin. Non-tunneled central line. This type is used for a shorter time than other types, usually for 7 days at the most. It is put in the neck, chest, or groin. Implanted port. This type can stay in place longer than other types of central lines. It is normally put in the upper chest but can also be placed in the upper arm or the belly. Surgery is needed to put it in and take it out. The type of central line you get will depend on how long you need it and your medical condition. Tell a doctor about: Any allergies you have. All medicines you take. These include vitamins, herbs, eye drops, and creams. Any problems you or family members have had with anesthesia. Any bleeding problems you have. Any surgeries you have had. Any medical conditions you have. Whether you're pregnant or may be pregnant. What are the risks? Your doctor will talk with you about risks. These may include: Infection. A blood clot. Bleeding. Getting a hole or crack in the central line. If this happens, the central line will need to be replaced. The catheter moving or coming out of place. A collapsed lung. Damage to other structures or  organs. What happens before the procedure? Medicines Ask about changing or stopping: Any medicines you take. Any vitamins, herbs, or supplements you take. Do not take aspirin or ibuprofen unless you're told to. Surgery safety For your safety, you may: Need to wash your skin with a soap that kills germs. Get antibiotics. Have your procedure site marked. Have hair removed at the procedure site. General instructions Eat and drink as told. Plan to have a responsible adult take you home from the hospital or clinic. Ask if you'll be staying overnight in the hospital. If you'll be going home right after the procedure, plan to have a responsible adult: Drive you home from the hospital or clinic. You won't be allowed to drive. Stay with you for the time you're told. What happens during the procedure? An IV will be put into one of your veins. You may be given: A sedative to help you relax. Anesthesia to keep you from feeling pain. Your skin will be cleaned with a soap that kills germs, and you may be covered with clean sheet called a drape. Your blood pressure, heart rate, breathing rate, and blood oxygen level will be checked during the procedure. The central line will be put into the vein and moved through it to the correct spot. The doctor may use X-ray equipment to help guide the central line to the right place. A bandage will be placed over the insertion area.  These steps may vary. Ask what you can expect. What can I expect after the procedure? You will watched closely until you leave. This includes checking your pain level, blood pressure, heart rate, and breathing rate. Caps may be put on the ends of the central line tubes. If you were given a sedative, do not drive or use machines until you're told it's safe. A sedative can make you sleepy. Follow these instructions at home: Caring for the tube  Follow instructions from your doctor about: Flushing the tube. Cleaning the tube and  the area around it. Only use germ-free, also called sterile, supplies to flush the central line. The supplies should be from your doctor, a pharmacy, or another place that your doctor recommends. Before you flush the tube or clean the area around it: Wash your hands with soap and water for at least 20 seconds. If you can't use soap and water, use hand sanitizer. Clean the central line hub with rubbing alcohol. To do this: Clean the central line hub with a new alcohol wipe. Scrub it using a twisting motion and rub for 10 to 15 seconds or for 30 twists. Follow the manufacturer's instructions. Be sure you scrub the top of the hub, not just the sides. Let the hub dry before use. Keep it from touching anything while drying. Do not re-use alcohol pads. Caring for your skin Check the skin around the central line every day for signs of infection. Check for: Redness, swelling, or pain. Fluid or blood. Warmth. Pus or a bad smell. Keep the area where the tube was put in clean and dry. Change bandages only as told by your doctor. Keep your bandage dry. If a bandage gets wet, have it changed right away. Storing and throwing away supplies Keep your supplies in a clean, dry location. Throw away any used syringes in a container that is only for sharp items, called a sharps container. You can buy a sharps container from a pharmacy, or you can make one by using an empty hard plastic bottle with a cover. Place any used bandages or infusion bags into a plastic bag. Throw that bag in the trash. General instructions Follow instructions from your doctor for the type of device that you have. Keep the tube clamped unless it's being used. If you or someone else accidentally pulls on the tube, make sure: The bandage is OK. There is no bleeding. The tube has not been pulled out. Do not use scissors or sharp objects near the tube. Do not take baths, swim, or use a hot tub until you're told it's OK. Ask if you can  shower. Ask your doctor what activities are safe for you. Your doctor may tell you not to lift anything or move your arm too much on the side of your central line. Contact a doctor if: You have signs of an infection where the tube was put in. The skin is irritated near the bandage. You catheter appears to be getting longer. This may mean it is coming out of the vein. Get help right away if: You have: A fever or chills. Shortness of breath. Pain in your chest. A fast heartbeat. Swelling in your neck, face, chest, or arm. You feel dizzy or you faint. There are red lines coming from where the tube was put in. The area where the tube was put in is bleeding and the bleeding won't stop. Your tube is hard to flush. You do not get a blood return from the  tube. The tube gets loose or comes out. The tube has a hole or a tear. The tube leaks. This information is not intended to replace advice given to you by your health care provider. Make sure you discuss any questions you have with your health care provider. Document Revised: 01/17/2023 Document Reviewed: 01/17/2023 [employer] Patient Education  2024 [employer].

## 2023-09-01 NOTE — ED Triage Notes (Signed)
 Pt referred [facility] ED from [facility] c/o abd distention, decreased ability to tolerate PO, and N/V. Pt states last BM was today. Excessive flatulence per pt.

## 2023-09-01 NOTE — H&P (Addendum)
 [facility] History and Physical  Marcus [name]:[id number] DOB: [birth year] DOA: 09/01/2023  Referring physician: ED  PCP: [name], NP   Patient is coming from: Home  Chief Complaint: Abdominal pain, nausea  HPI:  Patient is 83 years old male with past medical history of pancreatic cancer, obstructive jaundice status post ERCP stent placement, hypertension, bronchiectasis, coronary artery disease, GERD, obstructive sleep apnea undergoing chemotherapy with oncology as outpatient presented to the hospital with abdominal pain which has been going on for few days but got worse last couple of days or so. Pain was associated with nausea and decreased p.o. tolerance.  Patient with history of pancreatic adenocarcinoma being followed by oncology as outpatient for chemotherapy.   Pain is intermittent in nature and  has been having very small bowel movement due to lack of oral intake with only some liquids only at home.  Last small bowel movement was this morning.  Denies any urinary urgency, frequency or dysuria. No mention of  dizziness, lightheadedness or syncope.  Denies any chest pain, dyspnea at this time.  At baseline patient is able to ambulate with the help of a cane but has some limping problem on right side.  His son lives with him at his home.  In the ED, patient was noted to have mild leukopenia with WBC at 3.6, hemoglobin of 9.3.  BMP notable for mild hyponatremia with sodium of 134.  Urinalysis was negative for infection.  Lipase was negative.  CT scan of the abdomen and pelvis showed cholelithiasis with inflammatory stranding around the gallbladder worrisome for acute cholecystitis with dilated stomach with inflammatory stranding around the duodenal area tumor involvement not excluded.  CBD stent in place with stable hypodense lesions in the liver and stable pancreatic head mass with stable narrowing or occlusion of the portal vein. Patient received IV fluid 500 mL bolus,  Zosyn x 1 and was considered for admission to hospital for further evaluation and treatment.   Assessment and Plan Principal Problem:   Acute cholecystitis Active Problems:   Atrial fibrillation, chronic (HCC)   Pancreatic adenocarcinoma (HCC)   History of biliary duct stent placement   GERD (gastroesophageal reflux disease)   Cardiac pacemaker in situ   Abdominal distention, pain and nausea. Possible gastric outlet obstruction.   Cholelithiasis with possible acute cholecystitis Continue n.p.o.,place  NG tube, IV fluids antiemetics. General Surgery to follow the patient.CT scan showing possibility of cholelithiasis with possible cholecystitis. Will continue with Zosyn IV.  History of pancreatic cancer status post obstructive jaundice in the past status post ERCP with stent.  History of bariatric surgery.  History of colon cancer in the past.  Follows up with oncology as outpatient.  Receiving chemotherapy as outpatient.  LFTs are within normal limits at this time.  On Xeloda as outpatient.  Hold Xeloda for now.  GERD Will continue with IV PPI.  Obstructive sleep apnea On CPAP will continue while in the hospital.  History of coronary artery disease History of complete heart block status post permanent pacemaker.  History of chronic atrial fibrillation.  Currently rate controlled.  On Eliquis for anticoagulation as outpatient and will keep Eliquis on hold.  Transition to heparin drip.  Failure to thrive, deconditioning.  Will get PT evaluation.  Might benefit from nutrition evaluation when p.o. is established.  Hypertension Will put the patient on IV hydralazine for now.  Patient is on amlodipine, valsartan and HCTZ at home.  Bronchiectasis Appears to be stable at this time.  No  respiratory symptoms.  Will put on albuterol nebulizer, hypertonic normal saline nebulizer   DVT Prophylaxis: Heparin drip  Review of Systems:  All systems were reviewed and were negative unless  otherwise mentioned in the HPI   Past Medical History:  Diagnosis Date   Acute meniscal tear of knee left   Arthritis    generalized   AV block 08/01/2018   Benign essential hypertension 01/18/2007   Bronchiectasis with acute exacerbation 06/23/2020   Chest pain, atypical 08/23/2014   Chronic cough 10/07/2014   Coronary artery disease    Degeneration of lumbar intervertebral disc 10/14/2017   Diverticulosis of colon 07/07/2005   Elevated PSA 06/19/2014   GERD (gastroesophageal reflux disease) 01/18/2007   Gout, unspecified 01/18/2007   Hemorrhoids 01/19/2011   Hiatal hernia    History of colonic polyps 01/18/2007   Insomnia, unspecified 08/21/2007   Iron deficiency anemia, unspecified  01/19/2011   Lumbar post-laminectomy syndrome 10/14/2017   Lumbar spondylolysis    Lumbar strain 12/14/2011   Obstructive sleep apnea 01/18/2007   uses CPAP nightly   Paraesophageal hernia    large   Primary osteoarthritis of knee 05/23/2015   Prostate cancer (HCC) 2016   S/P knee replacement 05/23/2015   Sensorineural hearing loss, bilateral    Vitamin B12 deficiency    Vitamin D deficiency 10/28/2009   Past Surgical History:  Procedure Laterality Date   BILIARY BRUSHING  12/13/2022   Procedure: BILIARY BRUSHING;  Surgeon: Iva Boop, MD;  Location: [facility] ENDOSCOPY;  Service: Gastroenterology;;   BILIARY STENT PLACEMENT  12/13/2022   Procedure: BILIARY STENT PLACEMENT;  Surgeon: Iva Boop, MD;  Location: [facility] ENDOSCOPY;  Service: Gastroenterology;;   BILIARY STENT PLACEMENT N/A 02/07/2023   Procedure: BILIARY STENT PLACEMENT;  Surgeon: Lemar Lofty., MD;  Location: [facility] ENDOSCOPY;  Service: Gastroenterology;  Laterality: N/A;   BIOPSY  12/23/2022   Procedure: BIOPSY;  Surgeon: Lemar Lofty., MD;  Location: [facility] ENDOSCOPY;  Service: Gastroenterology;;   BRONCHIAL WASHINGS  10/30/2019   Procedure: BRONCHIAL WASHINGS;  Surgeon: [name], DO;  Location: [facility] ENDOSCOPY;   Service: Endoscopy;;   CARDIAC CATHETERIZATION     CATARACT EXTRACTION W/ INTRAOCULAR LENS  IMPLANT, BILATERAL Bilateral    COLONOSCOPY     ENDOSCOPIC RETROGRADE CHOLANGIOPANCREATOGRAPHY (ERCP) WITH PROPOFOL N/A 02/07/2023   Procedure: ENDOSCOPIC RETROGRADE CHOLANGIOPANCREATOGRAPHY (ERCP) WITH PROPOFOL;  Surgeon: Lemar Lofty., MD;  Location: [facility] ENDOSCOPY;  Service: Gastroenterology;  Laterality: N/A;  Stent change   ERCP N/A 12/13/2022   Procedure: ENDOSCOPIC RETROGRADE CHOLANGIOPANCREATOGRAPHY (ERCP);  Surgeon: Iva Boop, MD;  Location: [facility] ENDOSCOPY;  Service: Gastroenterology;  Laterality: N/A;  with stent placement   ESOPHAGOGASTRODUODENOSCOPY     ESOPHAGOGASTRODUODENOSCOPY (EGD) WITH PROPOFOL N/A 12/23/2022   Procedure: ESOPHAGOGASTRODUODENOSCOPY (EGD) WITH PROPOFOL;  Surgeon: Meridee Score Netty Starring., MD;  Location: [facility] ENDOSCOPY;  Service: Gastroenterology;  Laterality: N/A;   EUS N/A 12/23/2022   Procedure: UPPER ENDOSCOPIC ULTRASOUND (EUS) RADIAL;  Surgeon: Lemar Lofty., MD;  Location: [facility] ENDOSCOPY;  Service: Gastroenterology;  Laterality: N/A;   FINE NEEDLE ASPIRATION  12/23/2022   Procedure: FINE NEEDLE ASPIRATION (FNA) RADIAL;  Surgeon: Lemar Lofty., MD;  Location: [facility] ENDOSCOPY;  Service: Gastroenterology;;   IR IMAGING GUIDED PORT INSERTION  01/04/2023   KNEE ARTHROSCOPY Right 2007   KNEE ARTHROSCOPY  07/13/2011   Procedure: ARTHROSCOPY KNEE;  Surgeon: Erasmo Leventhal;  Location: [facility];  Service: Orthopedics;  Laterality: Left;  partial menisectomy with chondrylplasty   KNEE  SURGERY Left    LAMINECTOMY AND MICRODISCECTOMY LUMBAR SPINE  MARCH  2008   L3 -  4   LAPAROSCOPIC INCISIONAL / UMBILICAL / VENTRAL HERNIA REPAIR  2006   PACEMAKER IMPLANT N/A 08/02/2018   Procedure: PACEMAKER IMPLANT;  Surgeon: Marinus Maw, MD;  Location: [facility] INVASIVE CV LAB;  Service: Cardiovascular;  Laterality: N/A;   PROSTATE BIOPSY     RADIOACTIVE  SEED IMPLANT N/A 02/12/2019   Procedure: RADIOACTIVE SEED IMPLANT/BRACHYTHERAPY IMPLANT;  Surgeon: Marcine Matar, MD;  Location: [facility] ORS;  Service: Urology;  Laterality: N/A;  90 MINS   REMOVAL OF STONES  02/07/2023   Procedure: REMOVAL OF Sludge;  Surgeon: Meridee Score Netty Starring., MD;  Location: [facility] ENDOSCOPY;  Service: Gastroenterology;;   SHOULDER ARTHROSCOPY Right 05-23-2007   SIGMOID COLECTOMY FOR CANCER  1989   SPACE OAR INSTILLATION N/A 02/12/2019   Procedure: SPACE OAR INSTILLATION;  Surgeon: Marcine Matar, MD;  Location: [facility] ORS;  Service: Urology;  Laterality: N/A;   SPHINCTEROTOMY  12/13/2022   Procedure: SPHINCTEROTOMY;  Surgeon: Iva Boop, MD;  Location: [facility] ENDOSCOPY;  Service: Gastroenterology;;   Francine Graven REMOVAL  02/07/2023   Procedure: STENT REMOVAL;  Surgeon: Lemar Lofty., MD;  Location: [facility] ENDOSCOPY;  Service: Gastroenterology;;   TOTAL KNEE ARTHROPLASTY Left 05/23/2015   Procedure: LEFT TOTAL KNEE ARTHROPLASTY;  Surgeon: Eugenia Mcalpine, MD;  Location: [facility] ORS;  Service: Orthopedics;  Laterality: Left;   VERTICAL BANDED GASTROPLASTY  1986   VIDEO BRONCHOSCOPY N/A 10/30/2019   Procedure: VIDEO BRONCHOSCOPY WITH BRONICAL ALVEROLAR LAVAGE WITHOUT FLUORO;  Surgeon: [name], DO;  Location: [facility] ENDOSCOPY;  Service: Endoscopy;  Laterality: N/A;    Social History:  reports that he quit smoking about 49 years ago. His smoking use included cigarettes. He started smoking about 69 years ago. He has a 40 pack-year smoking history. He has never used smokeless tobacco. He reports current alcohol use. He reports that he does not use drugs.  No Known Allergies  Family History  Problem Relation Age of Onset   Stroke Paternal Grandfather    Heart disease Paternal Grandfather    Esophagitis Father        died from perforated esophagus   Breast cancer Mother    Colon cancer Maternal Grandmother        questionable   Esophageal cancer Neg Hx    Prostate cancer Neg Hx     Stomach cancer Neg Hx      Prior to Admission medications   Medication Sig Start Date End Date Taking? Authorizing Provider  acetaminophen (TYLENOL) 325 MG tablet Take 2 tablets (650 mg total) by mouth every 6 (six) hours as needed for mild pain (or Fever >/= 101). 01/13/23   Sheikh, Omair Latif, DO  amLODipine (NORVASC) 10 MG tablet Take 10 mg by mouth daily.    [provider]  apixaban (ELIQUIS) 5 MG TABS tablet Take 1 tablet (5 mg total) by mouth 2 (two) times daily. 02/23/23 07/21/23  Nafziger, Kandee Keen, NP  bisacodyl (DULCOLAX) 5 MG EC tablet Take 5 mg by mouth daily as needed for moderate constipation.    [provider]  Calcium Carb-Cholecalciferol (CALCIUM 600 + D PO) Take 1 tablet by mouth daily. 800 units of vitamin D    [provider]  capecitabine (XELODA) 500 MG tablet Take 3 tablets (1,500 mg total) by mouth 2 (two) times daily after a meal. Take Monday-Friday. Take only days of radiation. 08/25/23   [name], MD  cyanocobalamin (VITAMIN B12) 500 MCG tablet Take 1 tablet by mouth 2 (two) times daily. 07/26/23   [provider]  docusate sodium (COLACE) 100 MG capsule Take 100 mg by mouth 2 (two) times daily.    [provider]  Eszopiclone 3 MG TABS Take 1 tablet (3 mg total) by mouth at bedtime. Take immediately before bedtime 06/14/23   Nafziger, Kandee Keen, NP  fluticasone (FLONASE) 50 MCG/ACT nasal spray Place 2 sprays into both nostrils daily.    [provider]  glycerin adult 2 g suppository Place 1 suppository rectally as needed for constipation. 08/14/23   Ernie Avena, MD  ipratropium (ATROVENT) 0.03 % nasal spray Place 2 sprays into both nostrils every 12 (twelve) hours. 05/03/22     lidocaine-prilocaine (EMLA) cream Apply 1 Application topically as needed. 12/31/22   [name], MD  mirabegron ER (MYRBETRIQ) 50 MG TB24 tablet Take 1 tablet by mouth daily. 06/21/23   [provider]  mirtazapine (REMERON)  15 MG tablet Take 1 tablet (15 mg total) by mouth at bedtime. 06/10/23   [name], MD  ondansetron (ZOFRAN) 4 MG tablet Take 1 tablet (4 mg total) by mouth every 4 (four) hours as needed for nausea or vomiting. 08/10/23   Nafziger, Kandee Keen, NP  oxybutynin (DITROPAN) 5 MG tablet Take 2.5 mg by mouth 2 (two) times daily.    [provider]  oxyCODONE (ROXICODONE) 5 MG immediate release tablet Take 1 tablet (5 mg total) by mouth every 6 (six) hours as needed for severe pain (pain score 7-10). 08/18/23   Nafziger, Kandee Keen, NP  polyethylene glycol powder (GLYCOLAX/MIRALAX) 17 GM/SCOOP powder Mix 4 capfuls in a 32 oz Gatorade, Can repeat the next day for a satisfactory bowel movement. THEN one capful daily for maintenance 08/14/23   Ernie Avena, MD  Potassium Gluconate 550 MG TABS Take 550 mg by mouth daily.    [provider]  Respiratory Therapy Supplies (FLUTTER) DEVI 1 each by Does not apply route in the morning and at bedtime. 10/11/19   [name], DO  rosuvastatin (CRESTOR) 20 MG tablet Take 20 mg by mouth daily.    [provider]  sodium chloride HYPERTONIC 3 % nebulizer solution Take by nebulization as needed for other. 04/01/22   [name], NP  tamsulosin (FLOMAX) 0.4 MG CAPS capsule Take 1 capsule by mouth daily. 06/21/23   [provider]  traZODone (DESYREL) 50 MG tablet Take 1 tablet by mouth at bedtime as needed. 07/26/23   [provider]  valsartan-hydrochlorothiazide (DIOVAN-HCT) 160-25 MG tablet Take 1 tablet by mouth daily.    [provider]    Physical Exam:  Vitals:   09/01/23 1319 09/01/23 1737 09/01/23 1900 09/01/23 1938  BP: (!) 144/110 130/63  132/70  Pulse: (!) 59 60 60 60  Resp: 16 16  14   Temp: 97.9 F (36.6 C) 97.8 F (36.6 C)  98 F (36.7 C)  TempSrc: Oral Oral  Oral  SpO2: 100% 99% 99% 98%   Wt Readings from Last 3 Encounters:  09/01/23 98.6 kg  08/25/23 98 kg  08/22/23 98.6 kg   There is no  height or weight on file to calculate BMI.  General:  Average built, not in obvious distress well male, Communicative, oriented, elderly male HENT: Normocephalic, No scleral pallor or icterus noted. Oral mucosa is slightly dry. Chest: Diminished breath sounds bilaterally.[name] Kitchen No crackles or wheezes.  CVS: S1 &S2 heard. No murmur.  Regular rate  and rhythm. Abdomen: Soft, distended abdomen with tympanic on percussion, mild tenderness over the upper abdomen,.  Bowel sounds are heard. Extremities: No cyanosis, clubbing or edema.  Peripheral pulses are palpable. Psych: Alert, awake and oriented, normal mood CNS:  No cranial nerve deficits.  Moves all extremities. Skin: Warm and dry.  No rashes noted.  Labs on Admission:   CBC: Recent Labs  Lab 09/01/23 1032 09/01/23 1447 09/01/23 1454  WBC 3.8* 3.6*  --   NEUTROABS 2.4 2.5  --   HGB 9.7* 9.3* 9.9*  HCT 30.3* 30.2* 29.0*  MCV 85.6 87.5  --   PLT 174 150  --     Basic Metabolic Panel: Recent Labs  Lab 09/01/23 1032 09/01/23 1447 09/01/23 1454  NA 137 134* 138  K 3.8 3.6 3.7  CL 100 100 101  CO2 29 26  --   GLUCOSE 125* 111* 105*  BUN 25* 24* 22  CREATININE 1.11 1.11 1.20  CALCIUM 9.0 8.7*  --     Liver Function Tests: Recent Labs  Lab 09/01/23 1032 09/01/23 1447  AST 19 22  ALT 15 19  ALKPHOS 96 94  BILITOT 0.4 0.4  PROT 6.6 6.3*  ALBUMIN 3.6 3.1*   Recent Labs  Lab 09/01/23 1447  LIPASE 32   No results for input(s): "AMMONIA" in the last 168 hours.  Cardiac Enzymes: No results for input(s): "CKTOTAL", "CKMB", "CKMBINDEX", "TROPONINI" in the last 168 hours.  BNP (last 3 results) Recent Labs    01/09/23 1812 01/13/23 0345 02/28/23 0030  BNP 282.4* 114.0* 60.0    ProBNP (last 3 results) No results for input(s): "PROBNP" in the last 8760 hours.  CBG: No results for input(s): "GLUCAP" in the last 168 hours.  Lipase     Component Value Date/Time   LIPASE 32 09/01/2023 1447     Urinalysis     Component Value Date/Time   COLORURINE YELLOW 09/01/2023 1540   APPEARANCEUR CLEAR 09/01/2023 1540   LABSPEC 1.026 09/01/2023 1540   PHURINE 7.0 09/01/2023 1540   GLUCOSEU NEGATIVE 09/01/2023 1540   HGBUR NEGATIVE 09/01/2023 1540   BILIRUBINUR NEGATIVE 09/01/2023 1540   BILIRUBINUR n 10/10/2015 0919   KETONESUR NEGATIVE 09/01/2023 1540   PROTEINUR NEGATIVE 09/01/2023 1540   UROBILINOGEN 0.2 10/10/2015 0919   UROBILINOGEN 0.2 05/13/2015 0922   NITRITE NEGATIVE 09/01/2023 1540   LEUKOCYTESUR NEGATIVE 09/01/2023 1540     Drugs of Abuse  No results found for: "LABOPIA", "COCAINSCRNUR", "LABBENZ", "AMPHETMU", "THCU", "LABBARB"    Radiological Exams on Admission: CT ABDOMEN PELVIS W CONTRAST Result Date: 09/01/2023 CLINICAL DATA:  Evaluate for bowel obstruction. EXAM: CT ABDOMEN AND PELVIS WITH CONTRAST TECHNIQUE: Multidetector CT imaging of the abdomen and pelvis was performed using the standard protocol following bolus administration of intravenous contrast. RADIATION DOSE REDUCTION: This exam was performed according to the departmental dose-optimization program which includes automated exposure control, adjustment of the mA and/or kV according to patient size and/or use of iterative reconstruction technique. CONTRAST:  OMNIPAQUE IOHEXOL 300 MG/ML  SOLN COMPARISON:  CT abdomen and pelvis 08/14/2023. FINDINGS: Lower chest: No acute abnormality. Hepatobiliary: Common bile duct stent is unchanged in position. Pneumobilia is unchanged. There are few hypodense lesions in the liver measuring up to 16 mm which are unchanged from prior. Gallstones are present. There is inflammatory stranding surrounding the gallbladder. Pancreas: Ill-defined hypodense pancreatic head mass appears grossly unchanged. The body and tail of the pancreas are atrophic with ductal dilatation similar to prior. There is  no new acute inflammation or fluid collection. Spleen: Normal in size without focal abnormality.  Adrenals/Urinary Tract: 18 mm left renal cyst is again noted. There are punctate calcifications in the right kidney. There is no hydronephrosis. Adrenal glands and bladder are within normal limits. Stomach/Bowel: Sigmoid colon anastomosis is again noted. There are scattered colonic diverticula. The appendix and small bowel are within normal limits. Partial gastrectomy changes are present. The stomach is dilated with air-fluid level. There some wall thickening of the duodenal with mild surrounding inflammation. Vascular/Lymphatic: Aorta and IVC are normal in size. There is narrowing or occlusion of the portal vein near the portal confluence, unchanged. No enlarged lymph nodes are identified. Reproductive: Prostate radiotherapy seeds are present. Other: There surgical clips in the anterior abdominal wall. There is mild inflammatory stranding in the subcutaneous tissues of the anterior abdominal wall. There are small fat containing bilateral inguinal hernias. Musculoskeletal: Multilevel degenerative changes affect the spine. IMPRESSION: 1. Cholelithiasis with inflammatory stranding surrounding the gallbladder. Findings are worrisome for acute cholecystitis. 2. The stomach is dilated which may represent gastritis or obstruction. There is inflammatory stranding and wall thickening involving the duodenal which may represent duodenitis. Tumor involvement not excluded. 3. Common bile duct stent is unchanged in position. 4. Stable hypodense lesions in the liver. 5. Stable pancreatic head mass. 6. Stable narrowing or occlusion of the portal vein near the portal confluence. 7. Nonobstructing right renal stones. 8. Colonic diverticulosis. 9. Mild inflammatory stranding in the subcutaneous tissues of the anterior abdominal wall. Correlate clinically for cellulitis. Electronically Signed   By: Darliss Cheney M.D.   On: 09/01/2023 17:46   DG Chest Portable 1 View Result Date: 09/01/2023 CLINICAL DATA:  Weakness. EXAM: PORTABLE  CHEST 1 VIEW COMPARISON:  February 27, 2024. FINDINGS: Stable cardiomediastinal silhouette. Left-sided pacemaker is unchanged. Right internal jugular Port-A-Cath is unchanged. Hypoinflation of the lungs is noted with probable minimal bibasilar subsegmental atelectasis. Bony thorax is unremarkable. IMPRESSION: Hypoinflation of the lungs with probable minimal bibasilar subsegmental atelectasis. Electronically Signed   By: Lupita Raider M.D.   On: 09/01/2023 17:05    EKG: Not available for review   Consultant: General Surgery Dr. Donell Beers was consulted from the ED  Code Status: DNR  Microbiology none  Antibiotics: Zosyn IV  Family Communication:  Patients' condition and plan of care including tests being ordered have been discussed with the patient  who indicate understanding and agree with the plan.   Status is: Inpatient   Severity of Illness: The appropriate patient status for this patient is INPATIENT. Inpatient status is judged to be reasonable and necessary in order to provide the required intensity of service to ensure the patient's safety. The patient's presenting symptoms, physical exam findings, and initial radiographic and laboratory data in the context of their chronic comorbidities is felt to place them at high risk for further clinical deterioration. Furthermore, it is not anticipated that the patient will be medically stable for discharge from the hospital within 2 midnights of admission.   * I certify that at the point of admission it is my clinical judgment that the patient will require inpatient hospital care spanning beyond 2 midnights from the point of admission due to high intensity of service, high risk for further deterioration and high frequency of surveillance required.*  Signed, Joycelyn Das, MD [facility] 09/01/2023

## 2023-09-01 NOTE — ED Provider Notes (Signed)
 [place] EMERGENCY DEPARTMENT AT [name] Provider Note   CSN: [id number] Arrival date & time: 09/01/23  1313     History {Add pertinent medical, surgical, social history, OB history to HPI:1} Chief Complaint  Patient presents with   Abdominal Pain   Emesis    Marcus [name] is a 83 y.o. male.   Abdominal Pain Associated symptoms: vomiting   Emesis Associated symptoms: abdominal pain      Patient has history of vitamin B12 deficiency coronary artery disease prostate cancer hypertension bronchiectasis acid reflux vitamin D deficiency and pancreatic adenocarcinoma.  Patient has been having issues with recurrent abdominal discomfort vomiting.  He was last seen in the emergency room earlier this month on February 9.  He had a CT scan that showed stable appearance of his pancreatic malignancy.  Patient did not have any evidence of bowel obstruction.  Patient states he continues to have difficulty swallowing.  He has been having issues with nausea and vomiting.  Patient feels like food is getting caught in his stomach and not his throat.  Recently however he started developing abdominal distention.  He has had recurrent nausea vomiting.  Patient states he is having a lot of flatulence last bowel movement was today.  He went to the cancer center today.  They wanted him to be admitted to the hospital for further treatment.  There were no beds available so he was sent to the ED to initiate treatment and arrange for hospitalization  Home Medications Prior to Admission medications   Medication Sig Start Date End Date Taking? Authorizing Provider  acetaminophen (TYLENOL) 325 MG tablet Take 2 tablets (650 mg total) by mouth every 6 (six) hours as needed for mild pain (or Fever >/= 101). 01/13/23   Sheikh, Omair Latif, DO  amLODipine (NORVASC) 10 MG tablet Take 10 mg by mouth daily.    [provider]  apixaban (ELIQUIS) 5 MG TABS tablet Take 1 tablet (5 mg total) by mouth  2 (two) times daily. 02/23/23 07/21/23  Nafziger, Kandee Keen, NP  bisacodyl (DULCOLAX) 5 MG EC tablet Take 5 mg by mouth daily as needed for moderate constipation.    [provider]  Calcium Carb-Cholecalciferol (CALCIUM 600 + D PO) Take 1 tablet by mouth daily. 800 units of vitamin D    [provider]  capecitabine (XELODA) 500 MG tablet Take 3 tablets (1,500 mg total) by mouth 2 (two) times daily after a meal. Take Monday-Friday. Take only days of radiation. 08/25/23   [name], MD  cyanocobalamin (VITAMIN B12) 500 MCG tablet Take 1 tablet by mouth 2 (two) times daily. 07/26/23   [provider]  docusate sodium (COLACE) 100 MG capsule Take 100 mg by mouth 2 (two) times daily.    [provider]  Eszopiclone 3 MG TABS Take 1 tablet (3 mg total) by mouth at bedtime. Take immediately before bedtime 06/14/23   Nafziger, Kandee Keen, NP  fluticasone (FLONASE) 50 MCG/ACT nasal spray Place 2 sprays into both nostrils daily.    [provider]  glycerin adult 2 g suppository Place 1 suppository rectally as needed for constipation. 08/14/23   Ernie Avena, MD  ipratropium (ATROVENT) 0.03 % nasal spray Place 2 sprays into both nostrils every 12 (twelve) hours. 05/03/22     lidocaine-prilocaine (EMLA) cream Apply 1 Application topically as needed. 12/31/22   [name], MD  mirabegron ER (MYRBETRIQ) 50 MG TB24 tablet Take 1 tablet by mouth daily. 06/21/23  [provider]  mirtazapine (REMERON) 15 MG tablet Take 1 tablet (15 mg total) by mouth at bedtime. 06/10/23   [name], MD  ondansetron (ZOFRAN) 4 MG tablet Take 1 tablet (4 mg total) by mouth every 4 (four) hours as needed for nausea or vomiting. 08/10/23   Nafziger, Kandee Keen, NP  oxybutynin (DITROPAN) 5 MG tablet Take 2.5 mg by mouth 2 (two) times daily.    [provider]  oxyCODONE (ROXICODONE) 5 MG immediate release tablet Take 1 tablet (5 mg total) by mouth every 6 (six) hours as needed  for severe pain (pain score 7-10). 08/18/23   Nafziger, Kandee Keen, NP  polyethylene glycol powder (GLYCOLAX/MIRALAX) 17 GM/SCOOP powder Mix 4 capfuls in a 32 oz Gatorade, Can repeat the next day for a satisfactory bowel movement. THEN one capful daily for maintenance 08/14/23   Ernie Avena, MD  Potassium Gluconate 550 MG TABS Take 550 mg by mouth daily.    [provider]  Respiratory Therapy Supplies (FLUTTER) DEVI 1 each by Does not apply route in the morning and at bedtime. 10/11/19   [name], DO  rosuvastatin (CRESTOR) 20 MG tablet Take 20 mg by mouth daily.    [provider]  sodium chloride HYPERTONIC 3 % nebulizer solution Take by nebulization as needed for other. 04/01/22   [name], NP  tamsulosin (FLOMAX) 0.4 MG CAPS capsule Take 1 capsule by mouth daily. 06/21/23   [provider]  traZODone (DESYREL) 50 MG tablet Take 1 tablet by mouth at bedtime as needed. 07/26/23   [provider]  valsartan-hydrochlorothiazide (DIOVAN-HCT) 160-25 MG tablet Take 1 tablet by mouth daily.    [provider]      Allergies    Patient has no known allergies.    Review of Systems   Review of Systems  Gastrointestinal:  Positive for abdominal pain and vomiting.    Physical Exam Updated Vital Signs BP (!) 144/110 (BP Location: Right Arm)   Pulse (!) 59   Temp 97.9 F (36.6 C) (Oral)   Resp 16   SpO2 100%  Physical Exam Vitals and nursing note reviewed.  Constitutional:      Appearance: He is ill-appearing. He is not diaphoretic.  HENT:     Head: Normocephalic and atraumatic.     Right Ear: External ear normal.     Left Ear: External ear normal.  Eyes:     General: No scleral icterus.       Right eye: No discharge.        Left eye: No discharge.     Conjunctiva/sclera: Conjunctivae normal.  Neck:     Trachea: No tracheal deviation.  Cardiovascular:     Rate and Rhythm: Normal rate and regular rhythm.  Pulmonary:     Effort:  Pulmonary effort is normal. No respiratory distress.     Breath sounds: Normal breath sounds. No stridor. No wheezing or rales.  Abdominal:     General: Bowel sounds are normal. There is distension.     Palpations: Abdomen is soft.     Tenderness: There is generalized abdominal tenderness. There is no guarding or rebound.  Musculoskeletal:        General: No tenderness or deformity.     Cervical back: Neck supple.  Skin:    General: Skin is warm and dry.     Findings: No rash.  Neurological:     General: No focal deficit present.     Mental Status:  He is alert.     Cranial Nerves: No cranial nerve deficit, dysarthria or facial asymmetry.     Sensory: No sensory deficit.     Motor: No abnormal muscle tone or seizure activity.     Coordination: Coordination normal.  Psychiatric:        Mood and Affect: Mood normal.     ED Results / Procedures / Treatments   Labs (all labs ordered are listed, but only abnormal results are displayed) Labs Reviewed  COMPREHENSIVE METABOLIC PANEL  LIPASE, BLOOD  CBC WITH DIFFERENTIAL/PLATELET  URINALYSIS, ROUTINE W REFLEX MICROSCOPIC  I-STAT CHEM 8, ED    EKG None  Radiology No results found.  Procedures Procedures  {Document cardiac monitor, telemetry assessment procedure when appropriate:1}  Medications Ordered in ED Medications  sodium chloride 0.9 % bolus 500 mL (has no administration in time range)  ondansetron (ZOFRAN) injection 4 mg (has no administration in time range)    ED Course/ Medical Decision Making/ A&P   {   Click here for ABCD2, HEART and other calculatorsREFRESH Note before signing :1}                              Medical Decision Making Amount and/or Complexity of Data Reviewed Labs: ordered. Radiology: ordered.  Risk Prescription drug management.   ***  {Document critical care time when appropriate:1} {Document review of labs and clinical decision tools ie heart score, Chads2Vasc2 etc:1}  {Document  your independent review of radiology images, and any outside records:1} {Document your discussion with family members, caretakers, and with consultants:1} {Document social determinants of health affecting pt's care:1} {Document your decision making why or why not admission, treatments were needed:1} Final Clinical Impression(s) / ED Diagnoses Final diagnoses:  None    Rx / DC Orders ED Discharge Orders     None

## 2023-09-02 ENCOUNTER — Encounter (HOSPITAL_COMMUNITY): Payer: Self-pay | Admitting: Internal Medicine

## 2023-09-02 ENCOUNTER — Inpatient Hospital Stay (HOSPITAL_COMMUNITY): Payer: No Typology Code available for payment source

## 2023-09-02 DIAGNOSIS — K831 Obstruction of bile duct: Secondary | ICD-10-CM | POA: Diagnosis not present

## 2023-09-02 DIAGNOSIS — Z950 Presence of cardiac pacemaker: Secondary | ICD-10-CM

## 2023-09-02 DIAGNOSIS — C250 Malignant neoplasm of head of pancreas: Secondary | ICD-10-CM

## 2023-09-02 DIAGNOSIS — I2510 Atherosclerotic heart disease of native coronary artery without angina pectoris: Secondary | ICD-10-CM | POA: Diagnosis not present

## 2023-09-02 DIAGNOSIS — R935 Abnormal findings on diagnostic imaging of other abdominal regions, including retroperitoneum: Secondary | ICD-10-CM | POA: Diagnosis not present

## 2023-09-02 DIAGNOSIS — I4891 Unspecified atrial fibrillation: Secondary | ICD-10-CM | POA: Diagnosis not present

## 2023-09-02 DIAGNOSIS — R112 Nausea with vomiting, unspecified: Secondary | ICD-10-CM | POA: Diagnosis not present

## 2023-09-02 DIAGNOSIS — K219 Gastro-esophageal reflux disease without esophagitis: Secondary | ICD-10-CM

## 2023-09-02 DIAGNOSIS — K810 Acute cholecystitis: Secondary | ICD-10-CM | POA: Diagnosis not present

## 2023-09-02 DIAGNOSIS — J479 Bronchiectasis, uncomplicated: Secondary | ICD-10-CM

## 2023-09-02 DIAGNOSIS — M109 Gout, unspecified: Secondary | ICD-10-CM

## 2023-09-02 DIAGNOSIS — Z9884 Bariatric surgery status: Secondary | ICD-10-CM

## 2023-09-02 DIAGNOSIS — Z85038 Personal history of other malignant neoplasm of large intestine: Secondary | ICD-10-CM

## 2023-09-02 DIAGNOSIS — D649 Anemia, unspecified: Secondary | ICD-10-CM

## 2023-09-02 DIAGNOSIS — I10 Essential (primary) hypertension: Secondary | ICD-10-CM

## 2023-09-02 DIAGNOSIS — G4730 Sleep apnea, unspecified: Secondary | ICD-10-CM

## 2023-09-02 DIAGNOSIS — K311 Adult hypertrophic pyloric stenosis: Secondary | ICD-10-CM

## 2023-09-02 LAB — COMPREHENSIVE METABOLIC PANEL
ALT: 17 U/L (ref 0–44)
AST: 20 U/L (ref 15–41)
Albumin: 2.8 g/dL — ABNORMAL LOW (ref 3.5–5.0)
Alkaline Phosphatase: 92 U/L (ref 38–126)
Anion gap: 6 (ref 5–15)
BUN: 21 mg/dL (ref 8–23)
CO2: 24 mmol/L (ref 22–32)
Calcium: 8.6 mg/dL — ABNORMAL LOW (ref 8.9–10.3)
Chloride: 105 mmol/L (ref 98–111)
Creatinine, Ser: 1.14 mg/dL (ref 0.61–1.24)
GFR, Estimated: >60 mL/min (ref >60)
Glucose, Bld: 142 mg/dL — ABNORMAL HIGH (ref 70–99)
Potassium: 4 mmol/L (ref 3.5–5.1)
Sodium: 135 mmol/L (ref 135–145)
Total Bilirubin: 0.6 mg/dL (ref 0.0–1.2)
Total Protein: 5.9 g/dL — ABNORMAL LOW (ref 6.5–8.1)

## 2023-09-02 LAB — PROTIME-INR
INR: 1.4 — ABNORMAL HIGH (ref 0.8–1.2)
Prothrombin Time: 17.1 s — ABNORMAL HIGH (ref 11.4–15.2)

## 2023-09-02 LAB — CBC
HCT: 29.4 % — ABNORMAL LOW (ref 39.0–52.0)
Hemoglobin: 9 g/dL — ABNORMAL LOW (ref 13.0–17.0)
MCH: 27.5 pg (ref 26.0–34.0)
MCHC: 30.6 g/dL (ref 30.0–36.0)
MCV: 89.9 fL (ref 80.0–100.0)
Platelets: 138 10*3/uL — ABNORMAL LOW (ref 150–400)
RBC: 3.27 MIL/uL — ABNORMAL LOW (ref 4.22–5.81)
RDW: 16.7 % — ABNORMAL HIGH (ref 11.5–15.5)
WBC: 3.1 10*3/uL — ABNORMAL LOW (ref 4.0–10.5)
nRBC: 0 % (ref 0.0–0.2)

## 2023-09-02 LAB — MAGNESIUM: Magnesium: 2.1 mg/dL (ref 1.7–2.4)

## 2023-09-02 LAB — PHOSPHORUS: Phosphorus: 3.5 mg/dL (ref 2.5–4.6)

## 2023-09-02 LAB — APTT
aPTT: 89 s — ABNORMAL HIGH (ref 24–36)
aPTT: 97 s — ABNORMAL HIGH (ref 24–36)

## 2023-09-02 MED ORDER — DIPHENHYDRAMINE HCL 50 MG/ML IJ SOLN
12.5000 mg | Freq: Every evening | INTRAMUSCULAR | Status: AC | PRN
  Administered 2023-09-02: 12.5 mg via INTRAVENOUS
  Filled 2023-09-02: qty 1

## 2023-09-02 MED ORDER — MENTHOL 3 MG MT LOZG
1.0000 | LOZENGE | OROMUCOSAL | Status: DC | PRN
  Administered 2023-09-02: 3 mg via ORAL
  Filled 2023-09-02: qty 9

## 2023-09-02 MED ORDER — MORPHINE SULFATE (PF) 4 MG/ML IV SOLN
3.0000 mg | Freq: Once | INTRAVENOUS | Status: AC
  Administered 2023-09-02: 3 mg via INTRAVENOUS

## 2023-09-02 MED ORDER — LIDOCAINE HCL URETHRAL/MUCOSAL 2 % EX GEL
CUTANEOUS | Status: AC
  Filled 2023-09-02: qty 11

## 2023-09-02 MED ORDER — PHENOL 1.4 % MT LIQD
1.0000 | OROMUCOSAL | Status: DC | PRN
  Administered 2023-09-02: 1 via OROMUCOSAL
  Filled 2023-09-02: qty 177

## 2023-09-02 MED ORDER — MORPHINE SULFATE (PF) 2 MG/ML IV SOLN
INTRAVENOUS | Status: AC
  Filled 2023-09-02: qty 2

## 2023-09-02 MED ORDER — TECHNETIUM TC 99M MEBROFENIN IV KIT
5.0000 | PACK | Freq: Once | INTRAVENOUS | Status: AC
  Administered 2023-09-02: 5.17 via INTRAVENOUS

## 2023-09-02 NOTE — Evaluation (Signed)
 Physical Therapy Evaluation Patient Details Name: Marcus [name] MRN: [id number] DOB: [birth year] Today's Date: 09/02/2023  History of Present Illness  83 yo male presents to therapy following hospital admission on 09/01/2023 due to reports of poor po intake with N and V and referred from OP oncology clinic to ED. Pt has hx of adenocarcinoma of pancreatic head and currently undergoing chemotherapy and noted to have new inflammation around the gallbladder and duodenitis as well as abdominal distension and stomach full of debris on imaging. Pt dx with acute cholecystitis. Pt PMH includes but is not limited to: CAD, A-fib, pacemaker, colon ca, HTN, DJD lumbar region s/p surgery, obstructive jaundice, GERD, gout, prostate ca, OSA on CPAP, anemia, HOH, gastric bypass, and L TKA.  Clinical Impression    Pt admitted with above diagnosis.  Pt currently with functional limitations due to the deficits listed below (see PT Problem List). Pt exiting bathroom when PT arrived, daughter present. Pt agreeable to therapy intervention. Pt is mod I for bed mobility, S for transfer tasks, S for gait tasks with use of personal SPC 150 feet in hallway with lateral sway and wide BOS with leg length discrepancy L> R at baseline. Pt denies pain, SOB or dizziness. Pt endorses B LE abn sensation and daughter reports pt falls 1 x month. Pt would like to focus on balance and fall risk prevention with recommendation for [facility] services at time of d/c. Pt left seated EOB, all needs in place awaiting NG tube placement. Pt will benefit from acute skilled PT to increase their independence and safety with mobility to allow discharge.         If plan is discharge home, recommend the following: A little help with walking and/or transfers;A little help with bathing/dressing/bathroom;Assistance with cooking/housework;Assist for transportation;Help with stairs or ramp for entrance   Can travel by private vehicle        Equipment  Recommendations None recommended by PT  Recommendations for Other Services       Functional Status Assessment Patient has had a recent decline in their functional status and demonstrates the ability to make significant improvements in function in a reasonable and predictable amount of time.     Precautions / Restrictions Precautions Precautions: Fall Restrictions Weight Bearing Restrictions Per Provider Order: No      Mobility  Bed Mobility Overal bed mobility: Modified Independent                  Transfers Overall transfer level: Needs assistance Equipment used: Straight cane Transfers: Sit to/from Stand Sit to Stand: Supervision           General transfer comment: min cues for IV management and safety    Ambulation/Gait Ambulation/Gait assistance: Supervision Gait Distance (Feet): 150 Feet Assistive device: Straight cane (personal) Gait Pattern/deviations: Step-through pattern, Decreased stance time - right, Wide base of support Gait velocity: decreased     General Gait Details: lateral sway noted compounded by leg length discrepancy min cues for safety with approach to sitting surfaces for IV line management and safety, mild LOB noted with pt amb without AD for 4 feet and use of personal cane encouraged  Stairs            Wheelchair Mobility     Tilt Bed    Modified Rankin (Stroke Patients Only)       Balance Overall balance assessment: History of Falls, Mild deficits observed, not formally tested  Pertinent Vitals/Pain Pain Assessment Pain Assessment: No/denies pain    Home Living Family/patient expects to be discharged to:: Private residence Living Arrangements: Children (son) Available Help at Discharge: Family;Available 24 hours/day Type of Home: House Home Access: Stairs to enter Entrance Stairs-Rails: Left Entrance Stairs-Number of Steps: 3   Home Layout: One  level Home Equipment: Cane - single point;Shower seat - built in;Grab bars - tub/shower;Hand held shower head      Prior Function Prior Level of Function : Independent/Modified Independent             Mobility Comments: mod I with use of SPC for all ADLs, self care tasks and IADLs       Extremity/Trunk Assessment        Lower Extremity Assessment Lower Extremity Assessment: Overall WFL for tasks assessed (leg length discrepancy L > R, no orthotics)    Cervical / Trunk Assessment Cervical / Trunk Assessment: Back Surgery  Communication   Communication Communication: No apparent difficulties    Cognition Arousal: Alert Behavior During Therapy: WFL for tasks assessed/performed   PT - Cognitive impairments: No apparent impairments                         Following commands: Intact       Cueing       General Comments      Exercises     Assessment/Plan    PT Assessment Patient needs continued PT services  PT Problem List Decreased activity tolerance;Decreased balance;Decreased mobility       PT Treatment Interventions Gait training;DME instruction;Stair training;Functional mobility training;Therapeutic activities;Therapeutic exercise;Balance training;Neuromuscular re-education;Patient/family education    PT Goals (Current goals can be found in the Care Plan section)  Acute Rehab PT Goals Patient Stated Goal: to get this stuff out of my stomach and go home PT Goal Formulation: With patient Time For Goal Achievement: 09/16/23 Potential to Achieve Goals: Good    Frequency Min 1X/week     Co-evaluation               AM-PAC PT "6 Clicks" Mobility  Outcome Measure Help needed turning from your back to your side while in a flat bed without using bedrails?: None Help needed moving from lying on your back to sitting on the side of a flat bed without using bedrails?: None Help needed moving to and from a bed to a chair (including a  wheelchair)?: None Help needed standing up from a chair using your arms (e.g., wheelchair or bedside chair)?: A Little Help needed to walk in hospital room?: A Little Help needed climbing 3-5 steps with a railing? : A Lot 6 Click Score: 20    End of Session Equipment Utilized During Treatment: Gait belt Activity Tolerance: Patient tolerated treatment well;No increased pain Patient left: in bed;with family/visitor present Nurse Communication: Mobility status PT Visit Diagnosis: Unsteadiness on feet (R26.81);Other abnormalities of gait and mobility (R26.89);Muscle weakness (generalized) (M62.81);History of falling (Z91.81);Difficulty in walking, not elsewhere classified (R26.2)    Time: 9604-5409 PT Time Calculation (min) (ACUTE ONLY): 16 min   Charges:   PT Evaluation $PT Eval Low Complexity: 1 Low   PT General Charges $$ ACUTE PT VISIT: 1 Visit         Johnny Bridge, PT Acute Rehab   Jacqualyn Posey 09/02/2023, 11:49 AM

## 2023-09-02 NOTE — Progress Notes (Signed)
 PHARMACY - ANTICOAGULATION CONSULT NOTE  Pharmacy Consult for heparin Indication:  atrial fibrillation (apixaban on hold)  No Known Allergies  Patient Measurements: Weight: 99.5 kg (219 lb 5.7 oz) Heparin Dosing Weight: 89 kg  Vital Signs: Temp: 98.1 F (36.7 C) (02/28 0133) Temp Source: Oral (02/28 0133) BP: 130/78 (02/28 0133) Pulse Rate: 60 (02/28 0133)  Labs: Recent Labs    09/01/23 1032 09/01/23 1032 09/01/23 1447 09/01/23 1454 09/01/23 2120 09/02/23 0215  HGB 9.7*   < > 9.3* 9.9*  --  9.0*  HCT 30.3*  --  30.2* 29.0*  --  29.4*  PLT 174  --  150  --   --  138*  APTT  --   --   --   --  36 89*  HEPARINUNFRC  --   --   --   --  >1.10*  --   CREATININE 1.11  --  1.11 1.20  --  1.14   < > = values in this interval not displayed.    Estimated Creatinine Clearance: 56.1 mL/min (by C-G formula based on SCr of 1.14 mg/dL).   Medical History: Past Medical History:  Diagnosis Date   Acute meniscal tear of knee left   Arthritis    generalized   AV block 08/01/2018   Benign essential hypertension 01/18/2007   Bronchiectasis with acute exacerbation 06/23/2020   Chest pain, atypical 08/23/2014   Chronic cough 10/07/2014   Coronary artery disease    Degeneration of lumbar intervertebral disc 10/14/2017   Diverticulosis of colon 07/07/2005   Elevated PSA 06/19/2014   GERD (gastroesophageal reflux disease) 01/18/2007   Gout, unspecified 01/18/2007   Hemorrhoids 01/19/2011   Hiatal hernia    History of colonic polyps 01/18/2007   Insomnia, unspecified 08/21/2007   Iron deficiency anemia, unspecified  01/19/2011   Lumbar post-laminectomy syndrome 10/14/2017   Lumbar spondylolysis    Lumbar strain 12/14/2011   Obstructive sleep apnea 01/18/2007   uses CPAP nightly   Paraesophageal hernia    large   Primary osteoarthritis of knee 05/23/2015   Prostate cancer (HCC) 2016   S/P knee replacement 05/23/2015   Sensorineural hearing loss, bilateral    Vitamin B12  deficiency    Vitamin D deficiency 10/28/2009    Medications: Apixaban 5 mg PO BID PTA -Last dose: 2/26 PM  Assessment: Pt is an 35 yoM with PMH significant for pancreatic cancer s/p ERCP for obstructive jaundice. Pt chronically anticoagulated with apixaban for atrial fibrillation. Anticoagulation being transitioned to heparin - pharmacy to dose.   Today, 09/02/23 aPTT 89 sec- therapeutic on IV heparin 1300 units/hr Initial heparin level>1.1 as anticipated due to interaction with apixaban CBC: Hgb (9.0) is low, Plt 138 is low- decrease is likely dilutional, monitor for trend.  No bleeding or infusion related concerns reported by RN  Goal of Therapy:  Heparin level 0.3-0.7 units/ml aPTT 66-102 seconds Monitor platelets by anticoagulation protocol: Yes   Plan:  Continue heparin infusion at 1300 units/hr Check confirmatory aPTT at 1330  CBC, heparin level/aPTT daily. Once heparin level and aPTT correlate, can monitor using heparin level only Monitor for signs of bleeding  If heparin needs to be held for any procedures, please provide pharmacy with instructions for when to hold.  Junita Push, PharmD 09/02/23 at 5:59 AM

## 2023-09-02 NOTE — Consult Note (Addendum)
 Consultation Note   Referring Provider:  [facility] PCP: [name], NP Primary Gastroenterologist:  Stan Head, MD    Reason for Consultation: Gastric outlet obstruction DOA: 09/01/2023         Hospital Day: 2   ASSESSMENT    Brief Narrative:  83 y.o. year old male with a history of pancreatic head adenocarcinoma, GERD, delayed gastric emptying, remote gastric stapling procedure, sleep apnea, atrial fibrillation, remote colon cancer status post sigmoid colectomy, history of prostate cancer status post radiation, complete heart block status post pacemaker, hypertension, cholelithiasis.   Pancreatic head adenocarcinoma diagnosed June 2024 ( EUS w FNA) after presenting with biliary obstruction. He is s/p ERCP with stenting following by stent exchange Aug 2024. Undergoing chemotherapy. Currently admitted with abdominal pain, vomiting and concern for gastric outlet obstruction as well as acute cholecystitis on CT scan . Scan shows biliary stent in place. LFTs normal   AFIB, Apixaban on hold.  Getting IV heparin  Remote gastric stapling procedure.   Principal Problem:   Acute cholecystitis Active Problems:   GERD (gastroesophageal reflux disease)   Cardiac pacemaker in situ   Atrial fibrillation, chronic (HCC)   Pancreatic adenocarcinoma (HCC)   History of biliary duct stent placement      PLAN:   -Surgery has evaluated. Awaiting HIDA. On Zosyn. --No vomiting today. Several unsuccessful attempts by nursing to place NGT --Will schedule for EGD with possible stenting with Dr. Meridee Score on Monday. If okay with Surgery he can have clear liquids after HIDA ( unless HIDA positive and needs IR to evaluate for drain). I will secure time for EGD and hold IV heparin 4 hours prior  HPI   Brief GI history : In June 2024 patient was seen by [place] in the hospital for evaluation of weight loss  /jaundice and abnormal CT scan concerning  for pancreatic malignancy.  ERCP showed malignant appearing CBD stricture, stent was placed.  Brushings were negative for malignancy.  Subsequently underwent an EUS with FNA which was positive for adenocarcinoma.  He has been undergoing chemotherapy.  He had repeat ERCP with stent exchange in early August 2024  Patient was seen at oncologist office yesterday.  He was having progressive abdominal pain, nausea and vomiting..  There were no beds for direct admission, he was sent to the ED with concerns for gastric outlet obstruction.  CT scan is concerning for GOO. Additionally there is concern for acute cholecystitis and portal vein occlusion.. CBD stent in place.   Daughter in room and contributing to history. Patient has been having intermittent LEFT sided abdominal pain. He is unable to tolerate much food due to early satiety. He denies vomiting but rather describes "spitting up" but daughter says that he has been vomiting at home.    Labs:  WBC 3.1, hemoglobin 9, platelets 138.  Lipase 32.  Total bilirubin 0.4, AST 22, ALT 19, alk phos 94  Imaging CT AP with contrast  IMPRESSION: 1. Cholelithiasis with inflammatory stranding surrounding the gallbladder. Findings are worrisome for acute cholecystitis. 2. The stomach is dilated which may represent gastritis or obstruction. There is inflammatory stranding and wall thickening involving the duodenal which may represent duodenitis. Tumor involvement not excluded. 3. Common bile duct stent is  unchanged in position. 4. Stable hypodense lesions in the liver. 5. Stable pancreatic head mass. 6. Stable narrowing or occlusion of the portal vein near the portal confluence. 7. Nonobstructing right renal stones. 8. Colonic diverticulosis. 9. Mild inflammatory stranding in the subcutaneous tissues of the anterior abdominal wall. Correlate clinically for cellulitis  Interval History:  Surgery has evaluated and feels gastric outlet obstruction is likely  related to tumor progression.  HIDA scan ordered to evaluate for acute cholecystitis but recommending cholecystostomy tube rather than cholecystectomy if HIDA is positive .   Previous GI Evaluations   ERCP with stent exchange 02/07/23  - Prior biliary sphincterotomy appeared open. - One partially occluded stent from the biliary tree was seen in the major papilla. This was removed. - A single moderate biliary stricture was found in the lower third of the main bile duct. The stricture was malignant appearing. - The upper third of the main bile duct and middle third of the main bile duct were moderately dilated, secondary to aforementioned stricture. - Biliary sludge and was found. Complete removal was accomplished by sweeping. - One uncovered metal biliary stent was placed into the common bile duct to traverse the stenosis  EUS 12/23/2022 confirmed pancreatic head mass with invasion of SMV, no malignant appearing lymph nodes, 4 cysts visualized in the liver. FNA positive for adenocarcinoma   ERCP on 12/13/2022 with malignant appearing CBD stricture, fluid collected and cytology pending. 5 cm x 10 [place] plastic CBD stent placed.  He underwent ERCP with findings of malignant appearing CBD stricture, stent placed.  Brushings were negative for malignancy.  He was readmitted early July with sepsis secondary to pneumonia and acute cholecystitis   Labs and Imaging:  Recent Labs    09/01/23 1032 09/01/23 1447 09/01/23 1454 09/02/23 0215  WBC 3.8* 3.6*  --  3.1*  HGB 9.7* 9.3* 9.9* 9.0*  HCT 30.3* 30.2* 29.0* 29.4*  PLT 174 150  --  138*   Recent Labs    09/01/23 1032 09/01/23 1447 09/01/23 1454 09/02/23 0215  NA 137 134* 138 135  K 3.8 3.6 3.7 4.0  CL 100 100 101 105  CO2 29 26  --  24  GLUCOSE 125* 111* 105* 142*  BUN 25* 24* 22 21  CREATININE 1.11 1.11 1.20 1.14  CALCIUM 9.0 8.7*  --  8.6*   Recent Labs    09/02/23 0215  PROT 5.9*  ALBUMIN 2.8*  AST 20  ALT 17  ALKPHOS 92   BILITOT 0.6   No results for input(s): "LABPROT", "INR" in the last 72 hours.    Past Medical History:  Diagnosis Date   Acute meniscal tear of knee left   Arthritis    generalized   AV block 08/01/2018   Benign essential hypertension 01/18/2007   Bronchiectasis with acute exacerbation 06/23/2020   Chest pain, atypical 08/23/2014   Chronic cough 10/07/2014   Coronary artery disease    Degeneration of lumbar intervertebral disc 10/14/2017   Diverticulosis of colon 07/07/2005   Elevated PSA 06/19/2014   GERD (gastroesophageal reflux disease) 01/18/2007   Gout, unspecified 01/18/2007   Hemorrhoids 01/19/2011   Hiatal hernia    History of colonic polyps 01/18/2007   Insomnia, unspecified 08/21/2007   Iron deficiency anemia, unspecified  01/19/2011   Lumbar post-laminectomy syndrome 10/14/2017   Lumbar spondylolysis    Lumbar strain 12/14/2011   Obstructive sleep apnea 01/18/2007   uses CPAP nightly   Paraesophageal hernia    large   Primary  osteoarthritis of knee 05/23/2015   Prostate cancer (HCC) 2016   S/P knee replacement 05/23/2015   Sensorineural hearing loss, bilateral    Vitamin B12 deficiency    Vitamin D deficiency 10/28/2009    Past Surgical History:  Procedure Laterality Date   BILIARY BRUSHING  12/13/2022   Procedure: BILIARY BRUSHING;  Surgeon: Iva Boop, MD;  Location: [facility] ENDOSCOPY;  Service: Gastroenterology;;   BILIARY STENT PLACEMENT  12/13/2022   Procedure: BILIARY STENT PLACEMENT;  Surgeon: Iva Boop, MD;  Location: [facility] ENDOSCOPY;  Service: Gastroenterology;;   BILIARY STENT PLACEMENT N/A 02/07/2023   Procedure: BILIARY STENT PLACEMENT;  Surgeon: Lemar Lofty., MD;  Location: [facility] ENDOSCOPY;  Service: Gastroenterology;  Laterality: N/A;   BIOPSY  12/23/2022   Procedure: BIOPSY;  Surgeon: Lemar Lofty., MD;  Location: [facility] ENDOSCOPY;  Service: Gastroenterology;;   BRONCHIAL WASHINGS  10/30/2019   Procedure: BRONCHIAL WASHINGS;   Surgeon: [name], DO;  Location: [facility] ENDOSCOPY;  Service: Endoscopy;;   CARDIAC CATHETERIZATION     CATARACT EXTRACTION W/ INTRAOCULAR LENS  IMPLANT, BILATERAL Bilateral    COLONOSCOPY     ENDOSCOPIC RETROGRADE CHOLANGIOPANCREATOGRAPHY (ERCP) WITH PROPOFOL N/A 02/07/2023   Procedure: ENDOSCOPIC RETROGRADE CHOLANGIOPANCREATOGRAPHY (ERCP) WITH PROPOFOL;  Surgeon: Lemar Lofty., MD;  Location: [facility] ENDOSCOPY;  Service: Gastroenterology;  Laterality: N/A;  Stent change   ERCP N/A 12/13/2022   Procedure: ENDOSCOPIC RETROGRADE CHOLANGIOPANCREATOGRAPHY (ERCP);  Surgeon: Iva Boop, MD;  Location: [facility] ENDOSCOPY;  Service: Gastroenterology;  Laterality: N/A;  with stent placement   ESOPHAGOGASTRODUODENOSCOPY     ESOPHAGOGASTRODUODENOSCOPY (EGD) WITH PROPOFOL N/A 12/23/2022   Procedure: ESOPHAGOGASTRODUODENOSCOPY (EGD) WITH PROPOFOL;  Surgeon: Meridee Score Netty Starring., MD;  Location: [facility] ENDOSCOPY;  Service: Gastroenterology;  Laterality: N/A;   EUS N/A 12/23/2022   Procedure: UPPER ENDOSCOPIC ULTRASOUND (EUS) RADIAL;  Surgeon: Lemar Lofty., MD;  Location: [facility] ENDOSCOPY;  Service: Gastroenterology;  Laterality: N/A;   FINE NEEDLE ASPIRATION  12/23/2022   Procedure: FINE NEEDLE ASPIRATION (FNA) RADIAL;  Surgeon: Lemar Lofty., MD;  Location: [facility] ENDOSCOPY;  Service: Gastroenterology;;   IR IMAGING GUIDED PORT INSERTION  01/04/2023   KNEE ARTHROSCOPY Right 2007   KNEE ARTHROSCOPY  07/13/2011   Procedure: ARTHROSCOPY KNEE;  Surgeon: Erasmo Leventhal;  Location: [facility];  Service: Orthopedics;  Laterality: Left;  partial menisectomy with chondrylplasty   KNEE SURGERY Left    LAMINECTOMY AND MICRODISCECTOMY LUMBAR SPINE  MARCH  2008   L3 -  4   LAPAROSCOPIC INCISIONAL / UMBILICAL / VENTRAL HERNIA REPAIR  2006   PACEMAKER IMPLANT N/A 08/02/2018   Procedure: PACEMAKER IMPLANT;  Surgeon: Marinus Maw, MD;  Location: [facility]IVE [facility];  Service:  Cardiovascular;  Laterality: N/A;   PROSTATE BIOPSY     RADIOACTIVE SEED IMPLANT N/A 02/12/2019   Procedure: RADIOACTIVE SEED IMPLANT/BRACHYTHERAPY IMPLANT;  Surgeon: Marcine Matar, MD;  Location: [facility] ORS;  Service: Urology;  Laterality: N/A;  90 MINS   REMOVAL OF STONES  02/07/2023   Procedure: REMOVAL OF Sludge;  Surgeon: Meridee Score Netty Starring., MD;  Location: [facility] ENDOSCOPY;  Service: Gastroenterology;;   SHOULDER ARTHROSCOPY Right 05-23-2007   SIGMOID COLECTOMY FOR CANCER  1989   SPACE OAR INSTILLATION N/A 02/12/2019   Procedure: SPACE OAR INSTILLATION;  Surgeon: Marcine Matar, MD;  Location: [facility] ORS;  Service: Urology;  Laterality: N/A;   SPHINCTEROTOMY  12/13/2022   Procedure: SPHINCTEROTOMY;  Surgeon: Iva Boop, MD;  Location: [facility] ENDOSCOPY;  Service: Gastroenterology;;  STENT REMOVAL  02/07/2023   Procedure: STENT REMOVAL;  Surgeon: Lemar Lofty., MD;  Location: [facility] ENDOSCOPY;  Service: Gastroenterology;;   TOTAL KNEE ARTHROPLASTY Left 05/23/2015   Procedure: LEFT TOTAL KNEE ARTHROPLASTY;  Surgeon: Eugenia Mcalpine, MD;  Location: [facility] ORS;  Service: Orthopedics;  Laterality: Left;   VERTICAL BANDED GASTROPLASTY  1986   VIDEO BRONCHOSCOPY N/A 10/30/2019   Procedure: VIDEO BRONCHOSCOPY WITH BRONICAL ALVEROLAR LAVAGE WITHOUT FLUORO;  Surgeon: [name], DO;  Location: [facility] ENDOSCOPY;  Service: Endoscopy;  Laterality: N/A;    Family History  Problem Relation Age of Onset   Stroke Paternal Grandfather    Heart disease Paternal Grandfather    Esophagitis Father        died from perforated esophagus   Breast cancer Mother    Colon cancer Maternal Grandmother        questionable   Esophageal cancer Neg Hx    Prostate cancer Neg Hx    Stomach cancer Neg Hx     Prior to Admission medications   Medication Sig Start Date End Date Taking? Authorizing Provider  acetaminophen (TYLENOL) 325 MG tablet Take 2 tablets (650 mg total) by mouth every 6 (six) hours as needed for  mild pain (or Fever >/= 101). 01/13/23  Yes Sheikh, Omair Latif, DO  allopurinol (ZYLOPRIM) 300 MG tablet Take 300 mg by mouth daily.   Yes [provider]  amLODipine (NORVASC) 10 MG tablet Take 10 mg by mouth daily.   Yes [provider]  apixaban (ELIQUIS) 5 MG TABS tablet Take 1 tablet (5 mg total) by mouth 2 (two) times daily. 02/23/23 09/01/23 Yes Nafziger, Kandee Keen, NP  bisacodyl (DULCOLAX) 5 MG EC tablet Take 10 mg by mouth daily as needed for moderate constipation.   Yes [provider]  capecitabine (XELODA) 500 MG tablet Take 3 tablets (1,500 mg total) by mouth 2 (two) times daily after a meal. Take Monday-Friday. Take only days of radiation. 08/25/23  Yes [name], MD  Carboxymethylcellulose Sod PF 0.5 % SOLN Apply 1 drop to eye in the morning, at noon, in the evening, and at bedtime.   Yes [provider]  cyanocobalamin (VITAMIN B12) 500 MCG tablet Take 1 tablet by mouth 2 (two) times daily. 07/26/23  Yes [provider]  fluticasone (FLONASE) 50 MCG/ACT nasal spray Place 2 sprays into both nostrils daily.   Yes [provider]  glycerin adult 2 g suppository Place 1 suppository rectally as needed for constipation. 08/14/23  Yes Ernie Avena, MD  ipratropium (ATROVENT) 0.03 % nasal spray Place 2 sprays into both nostrils every 12 (twelve) hours. Patient taking differently: Place 2 sprays into both nostrils 2 (two) times daily as needed for rhinitis. 05/03/22  Yes   lidocaine-prilocaine (EMLA) cream Apply 1 Application topically as needed. 12/31/22  Yes [name], MD  mirabegron ER (MYRBETRIQ) 50 MG TB24 tablet Take 1 tablet by mouth daily. 06/21/23  Yes [provider]  oxybutynin (DITROPAN) 5 MG tablet Take 5 mg by mouth 2 (two) times daily as needed (urinary incontinence).   Yes [provider]  polyethylene glycol powder (GLYCOLAX/MIRALAX) 17 GM/SCOOP powder Mix 4 capfuls in a 32 oz Gatorade, Can repeat the  next day for a satisfactory bowel movement. THEN one capful daily for maintenance 08/14/23  Yes Ernie Avena, MD  Potassium Gluconate 550 MG TABS Take 550 mg by mouth daily.   Yes [provider]  rosuvastatin (CRESTOR) 20 MG tablet Take 20  mg by mouth daily.   Yes [provider]  sodium chloride HYPERTONIC 3 % nebulizer solution Take by nebulization as needed for other. 04/01/22  Yes [name], NP  tamsulosin (FLOMAX) 0.4 MG CAPS capsule Take 1 capsule by mouth daily. 06/21/23  Yes [provider]  traZODone (DESYREL) 50 MG tablet Take 1 tablet by mouth at bedtime as needed for sleep. 07/26/23  Yes [provider]  Calcium Carb-Cholecalciferol (CALCIUM 600 + D PO) Take 1 tablet by mouth daily. 800 units of vitamin D Patient not taking: Reported on 09/01/2023    [provider]  docusate sodium (COLACE) 100 MG capsule Take 100 mg by mouth 2 (two) times daily. Patient not taking: Reported on 09/01/2023    [provider]  Eszopiclone 3 MG TABS Take 1 tablet (3 mg total) by mouth at bedtime. Take immediately before bedtime Patient not taking: Reported on 09/01/2023 06/14/23   [name], NP  mirtazapine (REMERON) 15 MG tablet Take 1 tablet (15 mg total) by mouth at bedtime. Patient not taking: Reported on 09/01/2023 06/10/23   [name], MD  ondansetron (ZOFRAN) 4 MG tablet Take 1 tablet (4 mg total) by mouth every 4 (four) hours as needed for nausea or vomiting. Patient not taking: Reported on 09/01/2023 08/10/23   [name], NP  oxyCODONE (ROXICODONE) 5 MG immediate release tablet Take 1 tablet (5 mg total) by mouth every 6 (six) hours as needed for severe pain (pain score 7-10). Patient not taking: Reported on 09/01/2023 08/18/23   [name], NP  Respiratory Therapy Supplies (FLUTTER) DEVI 1 each by Does not apply route in the morning and at bedtime. 10/11/19   [name], DO  valsartan-hydrochlorothiazide (DIOVAN-HCT)  160-25 MG tablet Take 1 tablet by mouth daily. Patient not taking: Reported on 09/01/2023    [provider]    Current Facility-Administered Medications  Medication Dose Route Frequency Provider Last Rate Last Admin   0.9 %  sodium chloride infusion  250 mL Intravenous PRN Pokhrel, Laxman, MD       acetaminophen (TYLENOL) tablet 650 mg  650 mg Oral Q6H PRN Pokhrel, Laxman, MD       Or   acetaminophen (TYLENOL) suppository 650 mg  650 mg Rectal Q6H PRN Pokhrel, Laxman, MD       albuterol (PROVENTIL) (2.5 MG/3ML) 0.083% nebulizer solution 2.5 mg  2.5 mg Nebulization Q4H PRN Pokhrel, Laxman, MD       bisacodyl (DULCOLAX) suppository 10 mg  10 mg Rectal Daily PRN Pokhrel, Laxman, MD       Chlorhexidine Gluconate Cloth 2 % PADS 6 each  6 each Topical Daily Pokhrel, Laxman, MD   6 each at 09/02/23 1047   dextrose 5 % with KCl 20 mEq / L  infusion  20 mEq Intravenous Continuous Pokhrel, Laxman, MD 75 mL/hr at 09/01/23 2059 20 mEq at 09/01/23 2059   heparin ADULT infusion 100 units/mL (25000 units/269mL)  1,300 Units/hr Intravenous Continuous Cindi Carbon, [facility] 13 mL/hr at 09/01/23 2155 1,300 Units/hr at 09/01/23 2155   hydrALAZINE (APRESOLINE) injection 10 mg  10 mg Intravenous Q4H PRN Pokhrel, Laxman, MD       ondansetron (ZOFRAN) tablet 4 mg  4 mg Oral Q6H PRN Pokhrel, Laxman, MD       Or   ondansetron (ZOFRAN) injection 4 mg  4 mg Intravenous Q6H PRN Pokhrel, Laxman, MD       pantoprazole (PROTONIX) injection 40 mg  40 mg  Intravenous Q12H Pokhrel, Laxman, MD   40 mg at 09/02/23 1047   piperacillin-tazobactam (ZOSYN) IVPB 3.375 g  3.375 g Intravenous Q8H Cindi Carbon, RPH 12.5 mL/hr at 09/02/23 0400 3.375 g at 09/02/23 0400   polyethylene glycol (MIRALAX / GLYCOLAX) packet 17 g  17 g Oral Daily PRN Pokhrel, Laxman, MD       sodium chloride flush (NS) 0.9 % injection 10-40 mL  10-40 mL Intracatheter Q12H Pokhrel, Laxman, MD   10 mL at 09/02/23 1048   sodium chloride flush (NS) 0.9 %  injection 3 mL  3 mL Intravenous Q12H Pokhrel, Laxman, MD   3 mL at 09/02/23 1048   sodium chloride flush (NS) 0.9 % injection 3 mL  3 mL Intravenous PRN Pokhrel, Laxman, MD       sodium chloride HYPERTONIC 3 % nebulizer solution 4 mL  4 mL Nebulization BID Pokhrel, Laxman, MD        Allergies as of 09/01/2023   (No Known Allergies)    Social History   Socioeconomic History   Marital status: Widowed    Spouse name: Not on file   Number of children: 4   Years of education: 55   Highest education level: Some college, no degree  Occupational History   Occupation: retired    Associate Professor: [employer]  Tobacco Use   Smoking status: Former    Current packs/day: 0.00    Average packs/day: 2.0 packs/day for 20.0 years (40.0 ttl pk-yrs)    Types: Cigarettes    Start date: 07/05/1954    Quit date: 07/05/1974    Years since quitting: 49.1   Smokeless tobacco: Never  Vaping Use   Vaping status: Never Used  Substance and Sexual Activity   Alcohol use: Yes    Comment: wine   Drug use: No   Sexual activity: Not Currently  Other Topics Concern   Not on file  Social History Narrative   Recently widowed   Former Smoker    Alcohol use-yes 1-2 drinks per day      Occupation: Retired Associate Professor      Originally from [place], [place] - in [place] > 10 yrs as of 2017      Social Drivers of Health   Financial Resource Strain: Low Risk  (07/28/2021)   Overall Financial Resource Strain (CARDIA)    Difficulty of Paying Living Expenses: Not hard at all  Food Insecurity: No Food Insecurity (09/01/2023)   Hunger Vital Sign    Worried About Running Out of Food in the Last Year: Never true    Ran Out of Food in the Last Year: Never true  Transportation Needs: No Transportation Needs (09/01/2023)   PRAPARE - Administrator, Civil Service (Medical): No    Lack of Transportation (Non-Medical): No  Physical Activity: Inactive (07/28/2021)   Exercise Vital Sign    Days of Exercise per Week: 0 days     Minutes of Exercise per Session: 0 min  Stress: No Stress Concern Present (07/28/2021)   [employer] of Occupational Health - Occupational Stress Questionnaire    Feeling of Stress : Not at all  Social Connections: Moderately Isolated (09/01/2023)   Social Connection and Isolation Panel [NHANES]    Frequency of Communication with Friends and Family: Three times a week    Frequency of Social Gatherings with Friends and Family: More than three times a week    Attends Religious Services: More than 4 times per year  Active Member of Clubs or Organizations: No    Attends Banker Meetings: Never    Marital Status: Widowed  Intimate Partner Violence: Not At Risk (09/01/2023)   Humiliation, Afraid, Rape, and Kick questionnaire    Fear of Current or Ex-Partner: No    Emotionally Abused: No    Physically Abused: No    Sexually Abused: No     Code Status   Code Status: Limited: Do not attempt resuscitation (DNR) -DNR-LIMITED -Do Not Intubate/DNI   Review of Systems: All systems reviewed and negative except where noted in HPI.  Physical Exam: Vital signs in last 24 hours: Temp:  [97.5 F (36.4 C)-98.1 F (36.7 C)] 97.6 F (36.4 C) (02/28 0609) Pulse Rate:  [59-61] 61 (02/28 0609) Resp:  [14-20] 18 (02/28 0609) BP: (122-152)/(63-110) 146/78 (02/28 0609) SpO2:  [97 %-100 %] 98 % (02/28 0609) Weight:  [99.5 kg] 99.5 kg (02/28 0400) Last BM Date : 09/01/23  General:  Pleasant male in NAD Psych:  Cooperative. Normal mood and affect Eyes: Pupils equal Ears:  Normal auditory acuity Nose: No deformity, discharge or lesions Neck:  Supple, no masses felt Lungs:  Clear to auscultation.  Heart:  Regular rate.  Abdomen:  Soft, moderately distended, a few bowel sounds heard. Non tender.  Rectal :  Deferred Msk: Symmetrical without gross deformities.  Neurologic:  Alert, oriented, grossly normal neurologically Extremities : No edema Skin:  Intact without significant  lesions.    Intake/Output from previous day: 02/27 0701 - 02/28 0700 In: 748.5 [I.V.:202.5; IV Piggyback:545.9] Out: 1550 [Urine:1550] Intake/Output this shift:  Total I/O In: 3 [I.V.:3] Out: -    Willette Cluster, NP-C   09/02/2023, 10:52 AM  GI ATTENDING  History, laboratories, multiple endoscopy reports, and x-rays all personally reviewed.  Agree with comprehensive consultation note as outlined above.  Impression: 1.  Gastric outlet obstruction.  Symptoms and x-ray findings compatible.  No further nausea and vomiting.  Unable to place NG tube. 2.  Prior history of gastric stapling procedure 3.  Malignant obstructive jaundice secondary to pancreatic cancer, status post biliary stent placement.  Currently with uncovered metal stent in place.  Normal liver test.  No evidence for active biliary obstruction. 4.  Inflammatory changes about gallbladder.  Question cholecystitis.  HIDA pending.  Evaluation and management per general surgery.  Recommendations: 1.  Clear liquid diet only when cleared by surgery. 2.  Hold off on NG tube for now, given the difficult placement attempts, as he is no longer vomiting 3.  Schedule for upper endoscopy with possible interval stenting with Dr. Meridee Score on Monday. Will follow.  Wilhemina Bonito. Eda Keys., M.D. [facility] Division of Gastroenterology

## 2023-09-02 NOTE — Progress Notes (Signed)
 PHARMACY - ANTICOAGULATION CONSULT NOTE  Pharmacy Consult for heparin Indication:  atrial fibrillation (apixaban on hold)  No Known Allergies  Patient Measurements: Weight: 99.5 kg (219 lb 5.7 oz) Heparin Dosing Weight: 89 kg  Vital Signs: Temp: 97.6 F (36.4 C) (02/28 0609) Temp Source: Oral (02/28 0609) BP: 146/78 (02/28 0609) Pulse Rate: 61 (02/28 0609)  Labs: Recent Labs    09/01/23 1032 09/01/23 1032 09/01/23 1447 09/01/23 1454 09/01/23 2120 09/02/23 0215 09/02/23 1130  HGB 9.7*   < > 9.3* 9.9*  --  9.0*  --   HCT 30.3*  --  30.2* 29.0*  --  29.4*  --   PLT 174  --  150  --   --  138*  --   APTT  --   --   --   --  36 89* 97*  HEPARINUNFRC  --   --   --   --  >1.10*  --   --   CREATININE 1.11  --  1.11 1.20  --  1.14  --    < > = values in this interval not displayed.    Estimated Creatinine Clearance: 56.1 mL/min (by C-G formula based on SCr of 1.14 mg/dL).   Medical History: Past Medical History:  Diagnosis Date   Acute meniscal tear of knee left   Arthritis    generalized   AV block 08/01/2018   Benign essential hypertension 01/18/2007   Bronchiectasis with acute exacerbation 06/23/2020   Chest pain, atypical 08/23/2014   Chronic cough 10/07/2014   Coronary artery disease    Degeneration of lumbar intervertebral disc 10/14/2017   Diverticulosis of colon 07/07/2005   Elevated PSA 06/19/2014   GERD (gastroesophageal reflux disease) 01/18/2007   Gout, unspecified 01/18/2007   Hemorrhoids 01/19/2011   Hiatal hernia    History of colonic polyps 01/18/2007   Insomnia, unspecified 08/21/2007   Iron deficiency anemia, unspecified  01/19/2011   Lumbar post-laminectomy syndrome 10/14/2017   Lumbar spondylolysis    Lumbar strain 12/14/2011   Obstructive sleep apnea 01/18/2007   uses CPAP nightly   Paraesophageal hernia    large   Primary osteoarthritis of knee 05/23/2015   Prostate cancer (HCC) 2016   S/P knee replacement 05/23/2015   Sensorineural  hearing loss, bilateral    Vitamin B12 deficiency    Vitamin D deficiency 10/28/2009    Medications: Apixaban 5 mg PO BID PTA -Last dose: 2/26 PM  Assessment: Pt is an 21 yoM with PMH significant for pancreatic cancer s/p ERCP for obstructive jaundice. Pt chronically anticoagulated with apixaban for atrial fibrillation. Anticoagulation being transitioned to heparin - pharmacy to dose.   Today, 09/02/23 aPTT therapeutic x 2 on current IV heparin rate of 1300 units/hr Initial heparin level>1.1 as anticipated due to interaction with apixaban CBC: Hgb (9.0) is low, Plt 138 is low- decrease is likely dilutional, monitor for trend.  No bleeding or infusion related concerns reported by RN  Goal of Therapy:  Heparin level 0.3-0.7 units/ml aPTT 66-102 seconds Monitor platelets by anticoagulation protocol: Yes   Plan:  Continue heparin infusion at current rate of 1300 units/hr CBC, heparin level/aPTT daily. Once heparin level and aPTT correlate, can monitor using heparin level only Monitor for signs of bleeding   If heparin needs to be held for any procedures, please provide pharmacy with instructions for when to hold.   Hessie Knows, PharmD, BCPS Secure Chat if ?s 09/02/2023 12:28 PM

## 2023-09-02 NOTE — Progress Notes (Signed)
 Pt agreeable to NGT placement. This nurse attempted twice, once on each nare. He was unable to go chin to chest and I think because of that I was unable to place. Too much resistance.

## 2023-09-02 NOTE — Addendum Note (Signed)
 Encounter addended by: Margaretmary Dys, MD on: 09/02/2023 7:55 AM  Actions taken: Problem List reviewed, Medication List reviewed, Allergies reviewed, Clinical Note Signed

## 2023-09-02 NOTE — H&P (Signed)
 Chief Complaint: pancreatic adenocarcinoma; cholecystitis; biliary obstruction - image guided percutaneous cholecystostomy placement   Referring Provider(s): Hosie Spangle   Supervising Physician: Gilmer Mor  Patient Status: [facility] - In-pt  History of Present Illness: Marcus [name] is a 83 y.o. male with history of arthritis, AV bloc, BPH, and prostate cancer. Pt is with pancreatic adenocarcinoma s/p ERCP stent placement 12/23/22.  Pt presented to the ED on 09/01/23 with nausea, vomiting, and decreased intake.  A CT a/p was obtained 09/01/23 revealing cholelithiasis with inflammatory stranding surrounding the gallbladder, worrisome for acute cholecystitis.  Pt was admitted and surgery was consulted; HIDA was obtained 09/02/23 revealing gallstones.  Interventional radiology was consulted for possible image guided percutaneous cholecystostomy placement.  Imaging was reviewed and approved by Dr. Loreta Ave and Dr. Elby Showers 09/02/23 for image guided percutaneous cholecystostomy placement.    Patient is Full Code  Past Medical History:  Diagnosis Date   Acute meniscal tear of knee left   Arthritis    generalized   AV block 08/01/2018   Benign essential hypertension 01/18/2007   Bronchiectasis with acute exacerbation 06/23/2020   Chest pain, atypical 08/23/2014   Chronic cough 10/07/2014   Coronary artery disease    Degeneration of lumbar intervertebral disc 10/14/2017   Diverticulosis of colon 07/07/2005   Elevated PSA 06/19/2014   GERD (gastroesophageal reflux disease) 01/18/2007   Gout, unspecified 01/18/2007   Hemorrhoids 01/19/2011   Hiatal hernia    History of colonic polyps 01/18/2007   Insomnia, unspecified 08/21/2007   Iron deficiency anemia, unspecified  01/19/2011   Lumbar post-laminectomy syndrome 10/14/2017   Lumbar spondylolysis    Lumbar strain 12/14/2011   Obstructive sleep apnea 01/18/2007   uses CPAP nightly   Paraesophageal hernia    large   Primary  osteoarthritis of knee 05/23/2015   Prostate cancer (HCC) 2016   S/P knee replacement 05/23/2015   Sensorineural hearing loss, bilateral    Vitamin B12 deficiency    Vitamin D deficiency 10/28/2009    Past Surgical History:  Procedure Laterality Date   BILIARY BRUSHING  12/13/2022   Procedure: BILIARY BRUSHING;  Surgeon: Iva Boop, MD;  Location: [facility] ENDOSCOPY;  Service: Gastroenterology;;   BILIARY STENT PLACEMENT  12/13/2022   Procedure: BILIARY STENT PLACEMENT;  Surgeon: Iva Boop, MD;  Location: [facility] ENDOSCOPY;  Service: Gastroenterology;;   BILIARY STENT PLACEMENT N/A 02/07/2023   Procedure: BILIARY STENT PLACEMENT;  Surgeon: Lemar Lofty., MD;  Location: [facility] ENDOSCOPY;  Service: Gastroenterology;  Laterality: N/A;   BIOPSY  12/23/2022   Procedure: BIOPSY;  Surgeon: Lemar Lofty., MD;  Location: [facility] ENDOSCOPY;  Service: Gastroenterology;;   BRONCHIAL WASHINGS  10/30/2019   Procedure: BRONCHIAL WASHINGS;  Surgeon: [name], DO;  Location: [facility] ENDOSCOPY;  Service: Endoscopy;;   CARDIAC CATHETERIZATION     CATARACT EXTRACTION W/ INTRAOCULAR LENS  IMPLANT, BILATERAL Bilateral    COLONOSCOPY     ENDOSCOPIC RETROGRADE CHOLANGIOPANCREATOGRAPHY (ERCP) WITH PROPOFOL N/A 02/07/2023   Procedure: ENDOSCOPIC RETROGRADE CHOLANGIOPANCREATOGRAPHY (ERCP) WITH PROPOFOL;  Surgeon: Lemar Lofty., MD;  Location: [facility] ENDOSCOPY;  Service: Gastroenterology;  Laterality: N/A;  Stent change   ERCP N/A 12/13/2022   Procedure: ENDOSCOPIC RETROGRADE CHOLANGIOPANCREATOGRAPHY (ERCP);  Surgeon: Iva Boop, MD;  Location: [facility] ENDOSCOPY;  Service: Gastroenterology;  Laterality: N/A;  with stent placement   ESOPHAGOGASTRODUODENOSCOPY     ESOPHAGOGASTRODUODENOSCOPY (EGD) WITH PROPOFOL N/A 12/23/2022   Procedure: ESOPHAGOGASTRODUODENOSCOPY (EGD) WITH PROPOFOL;  Surgeon: Meridee Score Netty Starring., MD;  Location: [facility]  ENDOSCOPY;  Service: Gastroenterology;  Laterality: N/A;   EUS N/A  12/23/2022   Procedure: UPPER ENDOSCOPIC ULTRASOUND (EUS) RADIAL;  Surgeon: Lemar Lofty., MD;  Location: [facility] ENDOSCOPY;  Service: Gastroenterology;  Laterality: N/A;   FINE NEEDLE ASPIRATION  12/23/2022   Procedure: FINE NEEDLE ASPIRATION (FNA) RADIAL;  Surgeon: Lemar Lofty., MD;  Location: [facility] ENDOSCOPY;  Service: Gastroenterology;;   IR IMAGING GUIDED PORT INSERTION  01/04/2023   KNEE ARTHROSCOPY Right 2007   KNEE ARTHROSCOPY  07/13/2011   Procedure: ARTHROSCOPY KNEE;  Surgeon: Erasmo Leventhal;  Location: [facility];  Service: Orthopedics;  Laterality: Left;  partial menisectomy with chondrylplasty   KNEE SURGERY Left    LAMINECTOMY AND MICRODISCECTOMY LUMBAR SPINE  MARCH  2008   L3 -  4   LAPAROSCOPIC INCISIONAL / UMBILICAL / VENTRAL HERNIA REPAIR  2006   PACEMAKER IMPLANT N/A 08/02/2018   Procedure: PACEMAKER IMPLANT;  Surgeon: Marinus Maw, MD;  Location: [facility] INVASIVE CV LAB;  Service: Cardiovascular;  Laterality: N/A;   PROSTATE BIOPSY     RADIOACTIVE SEED IMPLANT N/A 02/12/2019   Procedure: RADIOACTIVE SEED IMPLANT/BRACHYTHERAPY IMPLANT;  Surgeon: Marcine Matar, MD;  Location: [facility] ORS;  Service: Urology;  Laterality: N/A;  90 MINS   REMOVAL OF STONES  02/07/2023   Procedure: REMOVAL OF Sludge;  Surgeon: Meridee Score Netty Starring., MD;  Location: [facility] ENDOSCOPY;  Service: Gastroenterology;;   SHOULDER ARTHROSCOPY Right 05-23-2007   SIGMOID COLECTOMY FOR CANCER  1989   SPACE OAR INSTILLATION N/A 02/12/2019   Procedure: SPACE OAR INSTILLATION;  Surgeon: Marcine Matar, MD;  Location: [facility] ORS;  Service: Urology;  Laterality: N/A;   SPHINCTEROTOMY  12/13/2022   Procedure: SPHINCTEROTOMY;  Surgeon: Iva Boop, MD;  Location: [facility] ENDOSCOPY;  Service: Gastroenterology;;   Francine Graven REMOVAL  02/07/2023   Procedure: STENT REMOVAL;  Surgeon: Lemar Lofty., MD;  Location: [facility] ENDOSCOPY;  Service: Gastroenterology;;   TOTAL KNEE ARTHROPLASTY Left  05/23/2015   Procedure: LEFT TOTAL KNEE ARTHROPLASTY;  Surgeon: Eugenia Mcalpine, MD;  Location: [facility] ORS;  Service: Orthopedics;  Laterality: Left;   VERTICAL BANDED GASTROPLASTY  1986   VIDEO BRONCHOSCOPY N/A 10/30/2019   Procedure: VIDEO BRONCHOSCOPY WITH BRONICAL ALVEROLAR LAVAGE WITHOUT FLUORO;  Surgeon: [name], DO;  Location: [facility] ENDOSCOPY;  Service: Endoscopy;  Laterality: N/A;    Allergies: Patient has no known allergies.  Medications: Prior to Admission medications   Medication Sig Start Date End Date Taking? Authorizing Provider  acetaminophen (TYLENOL) 325 MG tablet Take 2 tablets (650 mg total) by mouth every 6 (six) hours as needed for mild pain (or Fever >/= 101). 01/13/23  Yes Sheikh, Omair Latif, DO  allopurinol (ZYLOPRIM) 300 MG tablet Take 300 mg by mouth daily.   Yes [provider]  amLODipine (NORVASC) 10 MG tablet Take 10 mg by mouth daily.   Yes [provider]  apixaban (ELIQUIS) 5 MG TABS tablet Take 1 tablet (5 mg total) by mouth 2 (two) times daily. 02/23/23 09/01/23 Yes Nafziger, Kandee Keen, NP  bisacodyl (DULCOLAX) 5 MG EC tablet Take 10 mg by mouth daily as needed for moderate constipation.   Yes [provider]  capecitabine (XELODA) 500 MG tablet Take 3 tablets (1,500 mg total) by mouth 2 (two) times daily after a meal. Take Monday-Friday. Take only days of radiation. 08/25/23  Yes [name], MD  Carboxymethylcellulose Sod PF 0.5 % SOLN Apply 1 drop to eye in the morning, at noon, in the evening, and  at bedtime.   Yes [provider]  cyanocobalamin (VITAMIN B12) 500 MCG tablet Take 1 tablet by mouth 2 (two) times daily. 07/26/23  Yes [provider]  fluticasone (FLONASE) 50 MCG/ACT nasal spray Place 2 sprays into both nostrils daily.   Yes [provider]  glycerin adult 2 g suppository Place 1 suppository rectally as needed for constipation. 08/14/23  Yes Ernie Avena, MD  ipratropium (ATROVENT) 0.03 % nasal  spray Place 2 sprays into both nostrils every 12 (twelve) hours. Patient taking differently: Place 2 sprays into both nostrils 2 (two) times daily as needed for rhinitis. 05/03/22  Yes   lidocaine-prilocaine (EMLA) cream Apply 1 Application topically as needed. 12/31/22  Yes [name], MD  mirabegron ER (MYRBETRIQ) 50 MG TB24 tablet Take 1 tablet by mouth daily. 06/21/23  Yes [provider]  oxybutynin (DITROPAN) 5 MG tablet Take 5 mg by mouth 2 (two) times daily as needed (urinary incontinence).   Yes [provider]  polyethylene glycol powder (GLYCOLAX/MIRALAX) 17 GM/SCOOP powder Mix 4 capfuls in a 32 oz Gatorade, Can repeat the next day for a satisfactory bowel movement. THEN one capful daily for maintenance 08/14/23  Yes Ernie Avena, MD  Potassium Gluconate 550 MG TABS Take 550 mg by mouth daily.   Yes [provider]  rosuvastatin (CRESTOR) 20 MG tablet Take 20 mg by mouth daily.   Yes [provider]  sodium chloride HYPERTONIC 3 % nebulizer solution Take by nebulization as needed for other. 04/01/22  Yes [name], NP  tamsulosin (FLOMAX) 0.4 MG CAPS capsule Take 1 capsule by mouth daily. 06/21/23  Yes [provider]  traZODone (DESYREL) 50 MG tablet Take 1 tablet by mouth at bedtime as needed for sleep. 07/26/23  Yes [provider]  Calcium Carb-Cholecalciferol (CALCIUM 600 + D PO) Take 1 tablet by mouth daily. 800 units of vitamin D Patient not taking: Reported on 09/01/2023    [provider]  docusate sodium (COLACE) 100 MG capsule Take 100 mg by mouth 2 (two) times daily. Patient not taking: Reported on 09/01/2023    [provider]  Eszopiclone 3 MG TABS Take 1 tablet (3 mg total) by mouth at bedtime. Take immediately before bedtime Patient not taking: Reported on 09/01/2023 06/14/23   [name], NP  mirtazapine (REMERON) 15 MG tablet Take 1 tablet (15 mg total) by mouth at bedtime. Patient not  taking: Reported on 09/01/2023 06/10/23   [name], MD  ondansetron (ZOFRAN) 4 MG tablet Take 1 tablet (4 mg total) by mouth every 4 (four) hours as needed for nausea or vomiting. Patient not taking: Reported on 09/01/2023 08/10/23   [name], NP  oxyCODONE (ROXICODONE) 5 MG immediate release tablet Take 1 tablet (5 mg total) by mouth every 6 (six) hours as needed for severe pain (pain score 7-10). Patient not taking: Reported on 09/01/2023 08/18/23   [name], NP  Respiratory Therapy Supplies (FLUTTER) DEVI 1 each by Does not apply route in the morning and at bedtime. 10/11/19   [name], DO  valsartan-hydrochlorothiazide (DIOVAN-HCT) 160-25 MG tablet Take 1 tablet by mouth daily. Patient not taking: Reported on 09/01/2023    [provider]     Family History  Problem Relation Age of Onset   Stroke Paternal Grandfather    Heart disease Paternal Grandfather    Esophagitis Father        died from perforated esophagus   Breast cancer Mother  Colon cancer Maternal Grandmother        questionable   Esophageal cancer Neg Hx    Prostate cancer Neg Hx    Stomach cancer Neg Hx     Social History   Socioeconomic History   Marital status: Widowed    Spouse name: Not on file   Number of children: 4   Years of education: 60   Highest education level: Some college, no degree  Occupational History   Occupation: retired    Associate Professor: [employer]  Tobacco Use   Smoking status: Former    Current packs/day: 0.00    Average packs/day: 2.0 packs/day for 20.0 years (40.0 ttl pk-yrs)    Types: Cigarettes    Start date: 07/05/1954    Quit date: 07/05/1974    Years since quitting: 49.1   Smokeless tobacco: Never  Vaping Use   Vaping status: Never Used  Substance and Sexual Activity   Alcohol use: Yes    Comment: wine   Drug use: No   Sexual activity: Not Currently  Other Topics Concern   Not on file  Social History Narrative   Recently widowed   Former  Smoker    Alcohol use-yes 1-2 drinks per day      Occupation: Retired Associate Professor      Originally from [place], [place] - in [place] > 10 yrs as of 2017      Social Drivers of Health   Financial Resource Strain: Low Risk  (07/28/2021)   Overall Financial Resource Strain (CARDIA)    Difficulty of Paying Living Expenses: Not hard at all  Food Insecurity: No Food Insecurity (09/01/2023)   Hunger Vital Sign    Worried About Running Out of Food in the Last Year: Never true    Ran Out of Food in the Last Year: Never true  Transportation Needs: No Transportation Needs (09/01/2023)   PRAPARE - Administrator, Civil Service (Medical): No    Lack of Transportation (Non-Medical): No  Physical Activity: Inactive (07/28/2021)   Exercise Vital Sign    Days of Exercise per Week: 0 days    Minutes of Exercise per Session: 0 min  Stress: No Stress Concern Present (07/28/2021)   [employer] of Occupational Health - Occupational Stress Questionnaire    Feeling of Stress : Not at all  Social Connections: Moderately Isolated (09/01/2023)   Social Connection and Isolation Panel [NHANES]    Frequency of Communication with Friends and Family: Three times a week    Frequency of Social Gatherings with Friends and Family: More than three times a week    Attends Religious Services: More than 4 times per year    Active Member of Clubs or Organizations: No    Attends Banker Meetings: Never    Marital Status: Widowed     Review of Systems: A 12 point ROS discussed and pertinent positives are indicated in the HPI above.  All other systems are negative.  Review of Systems  Constitutional:  Negative for fatigue and fever.  HENT:  Negative for congestion and dental problem.   Respiratory:  Negative for cough, chest tightness, shortness of breath and wheezing.   Cardiovascular:  Negative for chest pain.  Gastrointestinal:  Negative for abdominal pain.  Neurological:  Negative for  light-headedness and headaches.  Psychiatric/Behavioral:  Negative for agitation, behavioral problems and confusion.     Vital Signs: BP (!) 146/78 (BP Location: Left Arm)   Pulse 61   Temp  97.6 F (36.4 C) (Oral)   Resp 18   Wt 219 lb 5.7 oz (99.5 kg)   SpO2 98%   BMI 33.35 kg/m   Advance Care Plan: The advanced care place/surrogate decision maker was discussed at the time of visit and the patient did not wish to discuss or was not able to name a surrogate decision maker or provide an advance care plan.  Physical Exam Constitutional:      Appearance: Normal appearance.  HENT:     Head: Normocephalic and atraumatic.     Mouth/Throat:     Mouth: Mucous membranes are moist.  Cardiovascular:     Rate and Rhythm: Normal rate and regular rhythm.  Pulmonary:     Effort: Pulmonary effort is normal.     Breath sounds: Normal breath sounds.  Abdominal:     General: Bowel sounds are normal.     Palpations: Abdomen is soft.     Tenderness: There is no rebound. Negative signs include Murphy's sign.  Musculoskeletal:        General: Normal range of motion.     Cervical back: Normal range of motion.  Skin:    General: Skin is warm and dry.  Neurological:     Mental Status: He is alert and oriented to person, place, and time.  Psychiatric:        Mood and Affect: Mood normal.        Behavior: Behavior normal.     Imaging: NM Hepatobiliary Liver Func Result Date: 09/02/2023 CLINICAL DATA:  Concern for cholecystitis. Gallbladder inflammation on CT EXAM: NUCLEAR MEDICINE HEPATOBILIARY IMAGING TECHNIQUE: Sequential images of the abdomen were obtained out to 60 minutes following intravenous administration of radiopharmaceutical. RADIOPHARMACEUTICALS:  5.2 mCi Tc-70m  Choletec IV COMPARISON:  CT 09/01/2023 FINDINGS: Prompt clearance radiotracer from blood pool and homogeneous uptake liver. Counts are evident in the small bowel by 20 minutes. Gallbladder fails to fill at 60 minutes. 3 mg IV  morphine was administered to augment filling of the gallbladder. Gallbladder failed to fill following morphine augmentation. IMPRESSION: 1. Non filling the gallbladder is concerning for acute cholecystitis. Of note, patient has not eaten in days which can produce false positive findings. 2. Patent common bile duct. These results will be called to the ordering clinician or representative by the Radiologist Assistant, and communication documented in the PACS or [employer]. Electronically Signed   By: Genevive Bi M.D.   On: 09/02/2023 15:35   CT ABDOMEN PELVIS W CONTRAST Result Date: 09/01/2023 CLINICAL DATA:  Evaluate for bowel obstruction. EXAM: CT ABDOMEN AND PELVIS WITH CONTRAST TECHNIQUE: Multidetector CT imaging of the abdomen and pelvis was performed using the standard protocol following bolus administration of intravenous contrast. RADIATION DOSE REDUCTION: This exam was performed according to the departmental dose-optimization program which includes automated exposure control, adjustment of the mA and/or kV according to patient size and/or use of iterative reconstruction technique. CONTRAST:  OMNIPAQUE IOHEXOL 300 MG/ML  SOLN COMPARISON:  CT abdomen and pelvis 08/14/2023. FINDINGS: Lower chest: No acute abnormality. Hepatobiliary: Common bile duct stent is unchanged in position. Pneumobilia is unchanged. There are few hypodense lesions in the liver measuring up to 16 mm which are unchanged from prior. Gallstones are present. There is inflammatory stranding surrounding the gallbladder. Pancreas: Ill-defined hypodense pancreatic head mass appears grossly unchanged. The body and tail of the pancreas are atrophic with ductal dilatation similar to prior. There is no new acute inflammation or fluid collection. Spleen: Normal in size without  focal abnormality. Adrenals/Urinary Tract: 18 mm left renal cyst is again noted. There are punctate calcifications in the right kidney. There is no  hydronephrosis. Adrenal glands and bladder are within normal limits. Stomach/Bowel: Sigmoid colon anastomosis is again noted. There are scattered colonic diverticula. The appendix and small bowel are within normal limits. Partial gastrectomy changes are present. The stomach is dilated with air-fluid level. There some wall thickening of the duodenal with mild surrounding inflammation. Vascular/Lymphatic: Aorta and IVC are normal in size. There is narrowing or occlusion of the portal vein near the portal confluence, unchanged. No enlarged lymph nodes are identified. Reproductive: Prostate radiotherapy seeds are present. Other: There surgical clips in the anterior abdominal wall. There is mild inflammatory stranding in the subcutaneous tissues of the anterior abdominal wall. There are small fat containing bilateral inguinal hernias. Musculoskeletal: Multilevel degenerative changes affect the spine. IMPRESSION: 1. Cholelithiasis with inflammatory stranding surrounding the gallbladder. Findings are worrisome for acute cholecystitis. 2. The stomach is dilated which may represent gastritis or obstruction. There is inflammatory stranding and wall thickening involving the duodenal which may represent duodenitis. Tumor involvement not excluded. 3. Common bile duct stent is unchanged in position. 4. Stable hypodense lesions in the liver. 5. Stable pancreatic head mass. 6. Stable narrowing or occlusion of the portal vein near the portal confluence. 7. Nonobstructing right renal stones. 8. Colonic diverticulosis. 9. Mild inflammatory stranding in the subcutaneous tissues of the anterior abdominal wall. Correlate clinically for cellulitis. Electronically Signed   By: Darliss Cheney M.D.   On: 09/01/2023 17:46   DG Chest Portable 1 View Result Date: 09/01/2023 CLINICAL DATA:  Weakness. EXAM: PORTABLE CHEST 1 VIEW COMPARISON:  February 27, 2024. FINDINGS: Stable cardiomediastinal silhouette. Left-sided pacemaker is unchanged. Right  internal jugular Port-A-Cath is unchanged. Hypoinflation of the lungs is noted with probable minimal bibasilar subsegmental atelectasis. Bony thorax is unremarkable. IMPRESSION: Hypoinflation of the lungs with probable minimal bibasilar subsegmental atelectasis. Electronically Signed   By: Lupita Raider M.D.   On: 09/01/2023 17:05   CUP PACEART REMOTE DEVICE CHECK Result Date: 08/16/2023 Scheduled remote reviewed. Normal device function.  Next remote 91 days. [place], [facility]  CT ABDOMEN PELVIS W CONTRAST Addendum Date: 08/14/2023 ADDENDUM REPORT: 08/14/2023 01:56 ADDENDUM: Vascular. There is mild narrowing at the origin of the celiac artery due to calcified plaque. Remainder of the celiac artery is patent. Superior mesenteric artery and inferior mesenteric artery widely patent. Electronically Signed   By: Charlett Nose M.D.   On: 08/14/2023 01:56   Result Date: 08/14/2023 CLINICAL DATA:  Left upper quadrant pain EXAM: CT ABDOMEN AND PELVIS WITH CONTRAST TECHNIQUE: Multidetector CT imaging of the abdomen and pelvis was performed using the standard protocol following bolus administration of intravenous contrast. RADIATION DOSE REDUCTION: This exam was performed according to the departmental dose-optimization program which includes automated exposure control, adjustment of the mA and/or kV according to patient size and/or use of iterative reconstruction technique. CONTRAST:  OMNIPAQUE IOHEXOL 350 MG/ML SOLN COMPARISON:  08/08/2023 FINDINGS: Lower chest: Bibasilar atelectasis, left greater than right. Pacer wires in the right heart. Hepatobiliary: Pneumobilia again noted. Metallic biliary stent is unchanged. Scattered cysts in the liver. No suspicious focal hepatic abnormality. Layering gallstones within the gallbladder. Mild surrounding pericholecystic inflammation again noted, unchanged. Pancreas: Again seen is the hypodense lesion in the pancreatic head/neck region, unchanged. Pancreatic ductal dilatation in  the body and tail stable. No change since prior study. Spleen: No focal abnormality.  Normal size. Adrenals/Urinary Tract: No suspicious  renal or adrenal abnormality. No stones or hydronephrosis. Urinary bladder unremarkable. Stomach/Bowel: Stable postoperative changes from partial gastrectomy. No bowel obstruction. Vascular/Lymphatic: Aortic atherosclerosis. No evidence of aneurysm or adenopathy. Reproductive: Radiation seeds in the prostate. Other: No free fluid or free air. Musculoskeletal: No acute bony abnormality. Degenerative changes in the lumbar spine. IMPRESSION: Stable appearance of the hypodense lesion in the pancreatic head with pancreatic ductal dilatation. Cholelithiasis.  Stable pericholecystic inflammation. Biliary stent with pneumobilia unchanged. Stable changes from prior partial gastrectomy. No evidence of bowel obstruction. Aortic atherosclerosis. Electronically Signed: By: Charlett Nose M.D. On: 08/14/2023 01:08   CT Angio Chest PE W and/or Wo Contrast Result Date: 08/14/2023 CLINICAL DATA:  Left upper quadrant pain EXAM: CT ANGIOGRAPHY CHEST WITH CONTRAST TECHNIQUE: Multidetector CT imaging of the chest was performed using the standard protocol during bolus administration of intravenous contrast. Multiplanar CT image reconstructions and MIPs were obtained to evaluate the vascular anatomy. RADIATION DOSE REDUCTION: This exam was performed according to the departmental dose-optimization program which includes automated exposure control, adjustment of the mA and/or kV according to patient size and/or use of iterative reconstruction technique. CONTRAST:  OMNIPAQUE IOHEXOL 350 MG/ML SOLN COMPARISON:  01/09/2023 FINDINGS: Cardiovascular: No filling defects in the pulmonary arteries to suggest pulmonary emboli. Cardiomegaly. Pacer wires in the right heart. Coronary artery and aortic atherosclerosis. Mediastinum/Nodes: No mediastinal, hilar, or axillary adenopathy. Trachea and esophagus are  unremarkable. Thyroid unremarkable. Lungs/Pleura: Bibasilar atelectasis, left greater than right. No effusions or suspicious pulmonary nodules. Upper Abdomen: See abdominal CT report. Musculoskeletal: No acute bony abnormality. Chest wall soft tissues are unremarkable. Review of the MIP images confirms the above findings. IMPRESSION: No evidence of pulmonary embolus. Cardiomegaly, coronary artery disease. Bibasilar atelectasis, left greater than right. Aortic Atherosclerosis (ICD10-I70.0). Electronically Signed   By: Charlett Nose M.D.   On: 08/14/2023 01:03   CT ABDOMEN PELVIS W CONTRAST Result Date: 08/08/2023 CLINICAL DATA:  Left lower quadrant abdominal pain vomiting pancreatic cancer. * Tracking Code: BO * EXAM: CT ABDOMEN AND PELVIS WITH CONTRAST TECHNIQUE: Multidetector CT imaging of the abdomen and pelvis was performed using the standard protocol following bolus administration of intravenous contrast. RADIATION DOSE REDUCTION: This exam was performed according to the departmental dose-optimization program which includes automated exposure control, adjustment of the mA and/or kV according to patient size and/or use of iterative reconstruction technique. CONTRAST:  OMNIPAQUE IOHEXOL 300 MG/ML  SOLN COMPARISON:  07/01/2023 FINDINGS: Lower chest: No acute abnormality. Hepatobiliary: Scattered cysts are again seen throughout the liver. No enhancing intrahepatic mass. Pneumobilia again identified within the nondependent left lobe suggesting patency the indwelling palliative metallic extrahepatic biliary stent. Cholelithiasis again noted. Punctate foci of non dependent gas within the gallbladder lumen suggests patency of the cystic duct. Mild pericholecystic inflammatory stranding is again identified, nonspecific. Pancreas: The hypodense lesion within the pancreatic head neck junction is again identified measuring roughly 1.9 x 2.3 cm at axial image # 34/2. The main pancreatic duct is dilated distal to this  lesion and there is atrophy of the pancreatic parenchyma again noted. Infiltrative changes surrounding the head of the pancreas in this region appear stable since prior examination. No loculated peripancreatic fluid collections are identified. Progressive narrowing of the main portal vein adjacent to the lesion which now demonstrates near occlusion. Spleen: Unremarkable Adrenals/Urinary Tract: The adrenal glands are. Simple cysts are seen within kidney for which no follow-up imaging recommended. Kidneys are otherwise unremarkable. Bladder. Stomach/Bowel: Surgical changes of partial gastrectomy are again identified. The stomach,  small bowel large. Appendix. No free intraperitoneal gas or fluid. Vascular/Lymphatic: As noted above, near occlusion the proximal main portal vein just beyond portosplenic. Extensive aortoiliac atherosclerotic calcification. No aortic aneurysm. Shotty peripancreatic is again identified appears stable. Reproductive: Brachytherapy seeds are seen within the prostate gland. Other: No abdominal wall hernia. Musculoskeletal: Degenerative changes are seen within the lumbar spine. No acute bone. Lytic blastic bone lesion. IMPRESSION: 1. Stable appearance of the hypodense lesion within the pancreatic head neck junction consistent with known pancreatic adenocarcinoma. Progressive narrowing of the main portal vein adjacent to the lesion which now demonstrates near occlusion. 2. Cholelithiasis. Mild pericholecystic inflammatory stranding again identified, nonspecific. If there is clinical concern for acute cholecystitis, further evaluation with right upper quadrant sonography could be considered. 3. Extensive pneumobilia, suggesting patency of the indwelling palliative metallic extrahepatic biliary stent. Aortic Atherosclerosis (ICD10-I70.0).  The Electronically Signed   By: Helyn Numbers M.D.   On: 08/08/2023 01:59   MR Abdomen W Wo Contrast Result Date: 08/06/2023 CLINICAL DATA:  Pancreatic cancer  assess treatment response, characterize liver lesions as cysts versus metastases EXAM: MRI ABDOMEN WITHOUT AND WITH CONTRAST TECHNIQUE: Multiplanar multisequence MR imaging of the abdomen was performed both before and after the administration of intravenous contrast. CONTRAST:  10mL GADAVIST GADOBUTROL 1 MMOL/ML IV SOLN COMPARISON:  CT abdomen pelvis, 07/01/2023 FINDINGS: Lower chest: No acute abnormality. Hepatobiliary: No solid liver abnormality is seen. Multiple fluid signal cysts scattered throughout the liver, previously identified by prior CT. No solid lesion or suspicious contrast enhancement. Unchanged gallbladder wall thickening. Sludge and small gallstones in the gallbladder as well as a small air-fluid level. Common bile duct stent, which appears patent, with unchanged mild intrahepatic biliary ductal dilatation and post stenting pneumobilia. Pancreas: Unchanged appearance of the pancreas with an ill-defined, infiltrative mass of the pancreatic head encasing the adjacent central superior mesenteric vein and contacting the superior mesenteric artery (series 1004, image 66). Although difficult to accurately marginate and measure this is approximately 5.6 x 3.3 cm (series 1004, image 87). Unchanged diffuse atrophy and cystic change of the distal pancreas, with distention of the pancreatic duct up to 1.0 cm (series 12, image 22). Spleen: Mild splenomegaly, maximum coronal span 14.2 cm. Adrenals/Urinary Tract: Adrenal glands are unremarkable. Simple, benign bilateral renal cortical and parapelvic cysts, for which no further follow-up or characterization is required. Kidneys are otherwise normal, without obvious renal calculi, solid lesion, or hydronephrosis. Stomach/Bowel: Stomach is within normal limits. No evidence of bowel wall thickening, distention, or inflammatory changes. Vascular/Lymphatic: Aortic atherosclerosis. No enlarged abdominal lymph nodes. Other: No abdominal wall hernia or abnormality. No  ascites. Musculoskeletal: No acute or significant osseous findings. IMPRESSION: 1. Unchanged appearance of the pancreas with an ill-defined, infiltrative mass of the pancreatic head encasing the adjacent central superior mesenteric vein and contacting the superior mesenteric artery. 2. Unchanged diffuse atrophy and cystic change of the distal pancreas, with distention of the pancreatic duct up to 1.0 cm. 3. Common bile duct stent, which appears patent, with unchanged mild intrahepatic biliary ductal dilatation and post stenting pneumobilia. 4. Multiple fluid signal cysts scattered throughout the liver, previously identified by prior CT. No solid lesion or suspicious contrast enhancement. No evidence of lymphadenopathy or distant metastatic disease in the abdomen. 5. Unchanged gallbladder wall thickening. Sludge and small gallstones in the gallbladder as well as a small air-fluid level. Aortic Atherosclerosis (ICD10-I70.0). Electronically Signed   By: Jearld Lesch M.D.   On: 08/06/2023 08:05    Labs:  CBC: Recent  Labs    08/25/23 1240 09/01/23 1032 09/01/23 1447 09/01/23 1454 09/02/23 0215  WBC 4.8 3.8* 3.6*  --  3.1*  HGB 10.1* 9.7* 9.3* 9.9* 9.0*  HCT 32.5* 30.3* 30.2* 29.0* 29.4*  PLT 209 174 150  --  138*    COAGS: Recent Labs    12/13/22 1127 12/13/22 1727 12/14/22 0037 01/09/23 1635 09/01/23 2120 09/02/23 0215 09/02/23 1130  INR 2.0* 1.6* 1.4* 1.3*  --   --   --   APTT  --   --   --   --  36 89* 97*    BMP: Recent Labs    08/25/23 1240 09/01/23 1032 09/01/23 1447 09/01/23 1454 09/02/23 0215  NA 140 137 134* 138 135  K 4.2 3.8 3.6 3.7 4.0  CL 104 100 100 101 105  CO2 28 29 26   --  24  GLUCOSE 127* 125* 111* 105* 142*  BUN 26* 25* 24* 22 21  CALCIUM 9.6 9.0 8.7*  --  8.6*  CREATININE 1.24 1.11 1.11 1.20 1.14  GFRNONAA 58* >60 >60  --  >60    LIVER FUNCTION TESTS: Recent Labs    08/25/23 1240 09/01/23 1032 09/01/23 1447 09/02/23 0215  BILITOT 0.4 0.4 0.4  0.6  AST 14* 19 22 20   ALT 13 15 19 17   ALKPHOS 94 96 94 92  PROT 7.1 6.6 6.3* 5.9*  ALBUMIN 3.7 3.6 3.1* 2.8*    TUMOR MARKERS: No results for input(s): "AFPTM", "CEA", "CA199", "CHROMGRNA" in the last 8760 hours.  Assessment and Plan:  Pt with pancreatic adenocarcinoma and cholecystitis scheduled for image guided percutaneous cholecystostomy placement 2/29/25.  Imaging was reviewed and approved by Dr. Loreta Ave and Dr. Elby Showers 09/02/23 for image guided percutaneous cholecystostomy placement.    Risks and benefits discussed with the patient including, but not limited to bleeding, infection, gallbladder perforation, bile leak, sepsis or even death.  All of the patient's questions were answered, patient is agreeable to proceed. Consent signed and in chart.  Thank you for allowing our service to participate in Marcus [name] 's care.  Electronically Signed: Loman Brooklyn, PA-C  / Jeananne Rama, PA-C  09/02/2023, 4:41 PM    I spent a total of 40 Minutes    in face to face in clinical consultation, greater than 50% of which was counseling/coordinating care for image guided percutaneous cholecystostomy placement.

## 2023-09-02 NOTE — Progress Notes (Signed)
 IP PROGRESS NOTE  Subjective:   He was admitted yesterday with intractable nausea/vomiting and marked distention of the stomach.  No new complaint.  Objective: Vital signs in last 24 hours: Blood pressure (!) 146/78, pulse 61, temperature 97.6 F (36.4 C), temperature source Oral, resp. rate 18, weight 219 lb 5.7 oz (99.5 kg), SpO2 98%.  Intake/Output from previous day: 02/27 0701 - 02/28 0700 In: 748.5 [I.V.:202.5; IV Piggyback:545.9] Out: 1550 [Urine:1550]  Physical Exam:  HEENT: No thrush Lungs: Air bilaterally Cardiac: Regular rate and rhythm Abdomen: Mildly distended (improved from yesterday) Extremities: No leg edema  Portacath/PICC-without erythema  Lab Results: Recent Labs    09/01/23 1447 09/01/23 1454 09/02/23 0215  WBC 3.6*  --  3.1*  HGB 9.3* 9.9* 9.0*  HCT 30.2* 29.0* 29.4*  PLT 150  --  138*    BMET Recent Labs    09/01/23 1447 09/01/23 1454 09/02/23 0215  NA 134* 138 135  K 3.6 3.7 4.0  CL 100 101 105  CO2 26  --  24  GLUCOSE 111* 105* 142*  BUN 24* 22 21  CREATININE 1.11 1.20 1.14  CALCIUM 8.7*  --  8.6*    Lab Results  Component Value Date   CEA1 6.4 (H) 12/11/2022   CEA 1.1 03/03/2010   CAN199 1,251 (H) 08/25/2023    Studies/Results: NM Hepatobiliary Liver Func Result Date: 09/02/2023 CLINICAL DATA:  Concern for cholecystitis. Gallbladder inflammation on CT EXAM: NUCLEAR MEDICINE HEPATOBILIARY IMAGING TECHNIQUE: Sequential images of the abdomen were obtained out to 60 minutes following intravenous administration of radiopharmaceutical. RADIOPHARMACEUTICALS:  5.2 mCi Tc-14m  Choletec IV COMPARISON:  CT 09/01/2023 FINDINGS: Prompt clearance radiotracer from blood pool and homogeneous uptake liver. Counts are evident in the small bowel by 20 minutes. Gallbladder fails to fill at 60 minutes. 3 mg IV morphine was administered to augment filling of the gallbladder. Gallbladder failed to fill following morphine augmentation. IMPRESSION: 1. Non  filling the gallbladder is concerning for acute cholecystitis. Of note, patient has not eaten in days which can produce false positive findings. 2. Patent common bile duct. These results will be called to the ordering clinician or representative by the Radiologist Assistant, and communication documented in the PACS or [employer]. Electronically Signed   By: Genevive Bi M.D.   On: 09/02/2023 15:35   CT ABDOMEN PELVIS W CONTRAST Result Date: 09/01/2023 CLINICAL DATA:  Evaluate for bowel obstruction. EXAM: CT ABDOMEN AND PELVIS WITH CONTRAST TECHNIQUE: Multidetector CT imaging of the abdomen and pelvis was performed using the standard protocol following bolus administration of intravenous contrast. RADIATION DOSE REDUCTION: This exam was performed according to the departmental dose-optimization program which includes automated exposure control, adjustment of the mA and/or kV according to patient size and/or use of iterative reconstruction technique. CONTRAST:  OMNIPAQUE IOHEXOL 300 MG/ML  SOLN COMPARISON:  CT abdomen and pelvis 08/14/2023. FINDINGS: Lower chest: No acute abnormality. Hepatobiliary: Common bile duct stent is unchanged in position. Pneumobilia is unchanged. There are few hypodense lesions in the liver measuring up to 16 mm which are unchanged from prior. Gallstones are present. There is inflammatory stranding surrounding the gallbladder. Pancreas: Ill-defined hypodense pancreatic head mass appears grossly unchanged. The body and tail of the pancreas are atrophic with ductal dilatation similar to prior. There is no new acute inflammation or fluid collection. Spleen: Normal in size without focal abnormality. Adrenals/Urinary Tract: 18 mm left renal cyst is again noted. There are punctate calcifications in the right kidney. There is no  hydronephrosis. Adrenal glands and bladder are within normal limits. Stomach/Bowel: Sigmoid colon anastomosis is again noted. There are scattered colonic  diverticula. The appendix and small bowel are within normal limits. Partial gastrectomy changes are present. The stomach is dilated with air-fluid level. There some wall thickening of the duodenal with mild surrounding inflammation. Vascular/Lymphatic: Aorta and IVC are normal in size. There is narrowing or occlusion of the portal vein near the portal confluence, unchanged. No enlarged lymph nodes are identified. Reproductive: Prostate radiotherapy seeds are present. Other: There surgical clips in the anterior abdominal wall. There is mild inflammatory stranding in the subcutaneous tissues of the anterior abdominal wall. There are small fat containing bilateral inguinal hernias. Musculoskeletal: Multilevel degenerative changes affect the spine. IMPRESSION: 1. Cholelithiasis with inflammatory stranding surrounding the gallbladder. Findings are worrisome for acute cholecystitis. 2. The stomach is dilated which may represent gastritis or obstruction. There is inflammatory stranding and wall thickening involving the duodenal which may represent duodenitis. Tumor involvement not excluded. 3. Common bile duct stent is unchanged in position. 4. Stable hypodense lesions in the liver. 5. Stable pancreatic head mass. 6. Stable narrowing or occlusion of the portal vein near the portal confluence. 7. Nonobstructing right renal stones. 8. Colonic diverticulosis. 9. Mild inflammatory stranding in the subcutaneous tissues of the anterior abdominal wall. Correlate clinically for cellulitis. Electronically Signed   By: Darliss Cheney M.D.   On: 09/01/2023 17:46   DG Chest Portable 1 View Result Date: 09/01/2023 CLINICAL DATA:  Weakness. EXAM: PORTABLE CHEST 1 VIEW COMPARISON:  February 27, 2024. FINDINGS: Stable cardiomediastinal silhouette. Left-sided pacemaker is unchanged. Right internal jugular Port-A-Cath is unchanged. Hypoinflation of the lungs is noted with probable minimal bibasilar subsegmental atelectasis. Bony thorax is  unremarkable. IMPRESSION: Hypoinflation of the lungs with probable minimal bibasilar subsegmental atelectasis. Electronically Signed   By: Lupita Raider M.D.   On: 09/01/2023 17:05    Medications: I have reviewed the patient's current medications.  Assessment/Plan: Pancreas cancer CT abdomen/pelvis 12/03/2022-suspected pancreas mass in the proximal body measuring 2 cm with probable mass effect on the superior aspect of the SMV, multiple hypodense liver lesions, many are new from a remote CT-indeterminate, 2 cystic lesions in the pancreas tail-nonspecific prior bariatric surgery ERCP 12/13/2022-malignant appearing common bile duct stricture, plastic stent placed, brushing cytology-no malignant cells EUS 12/23/2022-30 x 33 mm pancreas head mass with invasion of the SMV, no malignant appearing lymph nodes, 4 cysts in the visualized liver, FNA-adenocarcinoma Cycle 1 gemcitabine and abraxane 01/07/2023 CTs 01/09/2023-multiple low-attenuation liver lesions unchanged, largest of which are clearly benign fluid attenuation cyst, others more ill-defined and incompletely characterized suspicious for small metastases.  Similar appearance of hypodense mass in the pancreatic head.  Distended gallbladder with mild gallbladder wall thickening and adjacent pericholecystic fat stranding.  Sludge in the gallbladder fundus.  Common bile duct stent in place with diminished, persistent intrahepatic biliary ductal dilatation.  Negative for pulmonary embolism.  Acute appearing heterogeneous airspace opacity of the superior segment left lower lobe consistent with infection or aspiration. Cycle 2 gemcitabine and abraxane 01/21/2023, gemcitabine dose reduced due to neutropenia following cycle 1 Cycle 3 gemcitabine/Abraxane 02/04/2023 Cycle 4 gemcitabine/Abraxane 02/17/2023 Cycle 5 gemcitabine/Abraxane 03/04/2023 Cycle 6 gemcitabine/Abraxane 03/17/2023 CT abdomen/pelvis 03/28/2023-stable pancreas mass, multiple small hypoattenuating liver  lesions (review of CTs at the GI tumor conference is most consistent with liver metastases-some smaller compared to the baseline CT) Cycle 7 gemcitabine/Abraxane 04/01/2023 Cycle 8 gemcitabine/Abraxane 04/15/2023 Cycle 9 gemcitabine/Abraxane 04/29/2023 Treatment held per patient request 05/12/2023 Cycle  10 gemcitabine/Abraxane 05/27/2023 Cycle 11 gemcitabine/Abraxane 06/10/2023 Cycle 12 gemcitabine/Abraxane 06/24/2023 CTs 07/01/2023-no significant difference in size of the mass in the pancreatic head.  Redemonstration of multiple hypoattenuating liver lesions essentially unchanged and favored to represent simple cysts. 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, cholelithiasis and mild pericholecystic inflammatory stranding Plan for 5-1/2-week course of radiation plus concurrent Xeloda, start date 09/07/2023 Biliary obstruction secondary to 1-status post ERCP/stent placement 12/13/2022 Coronary artery disease Atrial fibrillation Gout Sleep apnea Status post bariatric surgery Hypertension Bronchiectasis Gastroesophageal reflux disease History of colon cancer in his 40s Complete heart block-pacemaker placed Admission 01/09/2023 with a fever and failure to thrive, CT chest with evidence of left lung pneumonia, CT abdomen/pelvis with a distended gallbladder and adjacent fat stranding Anemia 08/07/2023: Acute onset nausea/vomiting and abdominal pain-resolved after  Zofran/morphine in the emergency room, CT without acute findings Depression - he has high score PHQ-9 today, no SI, plan, or access today. He declined referral to mental health provider. Has a trusting relationship with PCP and agrees to call him soon to discuss his mental health  Admission 09/01/2023 with intractable nausea/vomiting and abdominal distention CT abdomen/pelvis-dilated stomach, wall thickening of the duodenum, cholelithiasis with inflammatory stranding surrounding gallbladder, common bile duct stent unchanged   Marcus [name] was admitted yesterday with intractable nausea/vomiting and marked abdominal distention.  A CT revealed a dilated stomach.  I am concerned he has developed gastric outlet obstruction from progression of the locally advanced pancreas tumor.  He saw Dr. Donell Beers.  She has ordered a HIDA scan and recommends a GI consult.  Recommendations: 1.  Evaluation for cholecystitis and gastric outlet obstruction per surgery and gastroenterology 2.  Treatment for pancreas cancer is on hold 3.  Please call oncology as needed over the weekend, I will check on him 09/05/2023   LOS: 1 day   Thornton Papas, MD   09/02/2023, 3:43 PM

## 2023-09-02 NOTE — Progress Notes (Addendum)
 PROGRESS NOTE  Marcus [name] ZOX:[id number] DOB: 1941-06-08 DOA: 09/01/2023 PCP: [name], NP   LOS: 1 day   Brief Narrative / Interim history: 83 year old male with pancreatic cancer, obstructive jaundice with history of ERCP and stent placement, HTN, CAD, OSA, currently getting chemo comes into the hospital with few days of abdominal pain, nausea, decreased p.o. intake.  Imaging in the ED with a CT scan of the abdomen and pelvis showed cholelithiasis with inflammatory stranding around the gallbladder, also dilated stomach with inflammatory stranding around the duodenal area, could not exclude tumor involvement.  CBD stent was in place.  General surgery was consulted and he was admitted to the hospital  Subjective / 24h Interval events: He is feeling about the same this morning.  Denies any chest pain, no shortness of breath.  No nausea but unable to eat anything due to stomach discomfort  Assesement and Plan: Principal Problem:   Acute cholecystitis Active Problems:   Atrial fibrillation, chronic (HCC)   Pancreatic adenocarcinoma (HCC)   History of biliary duct stent placement   GERD (gastroesophageal reflux disease)   Cardiac pacemaker in situ   Principal problem Concern for gastric outlet obstruction -continue n.p.o., IV fluids.  Gastro neurology consulted this morning, appreciate input.  This may be related to cancer progression, I have also discussed case with Dr. Ozzie Hoyle with oncology.  Active problems Cholelithiasis, concern for acute cholecystitis-General Surgery consulted and following.  HIDA scan pending.  Has been placed on antibiotics, continue  Pancreatic cancer-receiving treatment as an outpatient.  Hold Xeloda now.  Oncology follow-up  Pancytopenia-in the setting of malignancy, chemotherapy  History of CAD, CHB status post pacemaker, chronic A-fib -Eliquis on hold, on heparin infusion meanwhile until plan is better determined.  OSA-continue  CPAP  Bronchiectasis-stable, no symptoms  Obesity, class I-BMI 33  Scheduled Meds:  Chlorhexidine Gluconate Cloth  6 each Topical Daily   pantoprazole (PROTONIX) IV  40 mg Intravenous Q12H   sodium chloride flush  10-40 mL Intracatheter Q12H   sodium chloride flush  3 mL Intravenous Q12H   sodium chloride HYPERTONIC  4 mL Nebulization BID   Continuous Infusions:  sodium chloride     dextrose 5 % with KCl 20 mEq / L 20 mEq (09/01/23 2059)   heparin 1,300 Units/hr (09/01/23 2155)   piperacillin-tazobactam (ZOSYN)  IV 3.375 g (09/02/23 0400)   PRN Meds:.sodium chloride, acetaminophen **OR** acetaminophen, albuterol, bisacodyl, hydrALAZINE, ondansetron **OR** ondansetron (ZOFRAN) IV, polyethylene glycol, sodium chloride flush  Current Outpatient Medications  Medication Instructions   acetaminophen (TYLENOL) 650 mg, Oral, Every 6 hours PRN   allopurinol (ZYLOPRIM) 300 mg, Oral, Daily   amLODipine (NORVASC) 10 mg, Oral, Daily   bisacodyl (DULCOLAX) 10 mg, Oral, Daily PRN   Calcium Carb-Cholecalciferol (CALCIUM 600 + D PO) 1 tablet, Daily   capecitabine (XELODA) 1,500 mg, Oral, 2 times daily after meals, Take Monday-Friday. Take only days of radiation.   Carboxymethylcellulose Sod PF 0.5 % SOLN 1 drop, Ophthalmic, 4 times daily   cyanocobalamin (VITAMIN B12) 500 MCG tablet 1 tablet, Oral, 2 times daily   docusate sodium (COLACE) 100 mg, 2 times daily   Eliquis 5 mg, Oral, 2 times daily   Eszopiclone 3 mg, Oral, Daily at bedtime, Take immediately before bedtime   fluticasone (FLONASE) 50 MCG/ACT nasal spray 2 sprays, Each Nare, Daily   glycerin adult 2 g suppository 1 suppository, Rectal, As needed   ipratropium (ATROVENT) 0.03 % nasal spray 2 sprays, Each Nare, Every 12 hours  lidocaine-prilocaine (EMLA) cream 1 Application, Topical, As needed   mirabegron ER (MYRBETRIQ) 50 MG TB24 tablet 1 tablet, Oral, Daily   mirtazapine (REMERON) 15 mg, Oral, Daily at bedtime   ondansetron  (ZOFRAN) 4 mg, Oral, Every 4 hours PRN   oxybutynin (DITROPAN) 5 mg, Oral, 2 times daily PRN   oxyCODONE (ROXICODONE) 5 mg, Oral, Every 6 hours PRN   polyethylene glycol powder (GLYCOLAX/MIRALAX) 17 GM/SCOOP powder Mix 4 capfuls in a 32 oz Gatorade, Can repeat the next day for a satisfactory bowel movement. THEN one capful daily for maintenance   Potassium Gluconate 550 mg, Oral, Daily   Respiratory Therapy Supplies (FLUTTER) DEVI 1 each, Does not apply, 2 times daily   rosuvastatin (CRESTOR) 20 mg, Oral, Daily   sodium chloride HYPERTONIC 3 % nebulizer solution Nebulization, As needed   tamsulosin (FLOMAX) 0.4 MG CAPS capsule 1 capsule, Oral, Daily   traZODone (DESYREL) 50 MG tablet 1 tablet, Oral, At bedtime PRN   valsartan-hydrochlorothiazide (DIOVAN-HCT) 160-25 MG tablet 1 tablet, Daily    Diet Orders (From admission, onward)     Start     Ordered   09/01/23 1935  Diet NPO time specified Except for: Ice Chips  Diet effective now       Question:  Except for  Answer:  Ice Chips   09/01/23 1936            DVT prophylaxis: SCDs Start: 09/01/23 1934   Lab Results  Component Value Date   PLT 138 (L) 09/02/2023      Code Status: Limited: Do not attempt resuscitation (DNR) -DNR-LIMITED -Do Not Intubate/DNI   Family Communication: no family at bedside   Status is: Inpatient Remains inpatient appropriate because: Severity of illness   Level of care: Med-Surg  Consultants:  General surgery Gastroenterology Oncology  Objective: Vitals:   09/01/23 2036 09/02/23 0133 09/02/23 0400 09/02/23 0609  BP: (!) 152/71 130/78  (!) 146/78  Pulse: (!) 59 60  61  Resp: 18 18  18   Temp: 98 F (36.7 C) 98.1 F (36.7 C)  97.6 F (36.4 C)  TempSrc: Oral Oral  Oral  SpO2: 98% 97%  98%  Weight:   99.5 kg     Intake/Output Summary (Last 24 hours) at 09/02/2023 1009 Last data filed at 09/02/2023 0600 Gross per 24 hour  Intake 748.45 ml  Output 1550 ml  Net -801.55 ml   Wt  Readings from Last 3 Encounters:  09/02/23 99.5 kg  09/01/23 98.6 kg  08/25/23 98 kg    Examination:  Constitutional: NAD Eyes: no scleral icterus ENMT: Mucous membranes are moist.  Neck: normal, supple Respiratory: clear to auscultation bilaterally, no wheezing, no crackles. Normal respiratory effort. No accessory muscle use.  Cardiovascular: Regular rate and rhythm, no murmurs / rubs / gallops. No LE edema.  Abdomen: non distended, no tenderness. Bowel sounds positive.    Data Reviewed: I have independently reviewed following labs and imaging studies   CBC Recent Labs  Lab 09/01/23 1032 09/01/23 1447 09/01/23 1454 09/02/23 0215  WBC 3.8* 3.6*  --  3.1*  HGB 9.7* 9.3* 9.9* 9.0*  HCT 30.3* 30.2* 29.0* 29.4*  PLT 174 150  --  138*  MCV 85.6 87.5  --  89.9  MCH 27.4 27.0  --  27.5  MCHC 32.0 30.8  --  30.6  RDW 16.7* 16.7*  --  16.7*  LYMPHSABS 0.7 0.6*  --   --   MONOABS 0.5 0.4  --   --  EOSABS 0.1 0.1  --   --   BASOSABS 0.0 0.0  --   --     Recent Labs  Lab 09/01/23 1032 09/01/23 1447 09/01/23 1454 09/02/23 0215  NA 137 134* 138 135  K 3.8 3.6 3.7 4.0  CL 100 100 101 105  CO2 29 26  --  24  GLUCOSE 125* 111* 105* 142*  BUN 25* 24* 22 21  CREATININE 1.11 1.11 1.20 1.14  CALCIUM 9.0 8.7*  --  8.6*  AST 19 22  --  20  ALT 15 19  --  17  ALKPHOS 96 94  --  92  BILITOT 0.4 0.4  --  0.6  ALBUMIN 3.6 3.1*  --  2.8*  MG  --   --   --  2.1    ------------------------------------------------------------------------------------------------------------------ No results for input(s): "CHOL", "HDL", "LDLCALC", "TRIG", "CHOLHDL", "LDLDIRECT" in the last 72 hours.  Lab Results  Component Value Date   HGBA1C 6.3 11/24/2022   ------------------------------------------------------------------------------------------------------------------ No results for input(s): "TSH", "T4TOTAL", "T3FREE", "THYROIDAB" in the last 72 hours.  Invalid input(s):  "FREET3"  Cardiac Enzymes No results for input(s): "CKMB", "TROPONINI", "MYOGLOBIN" in the last 168 hours.  Invalid input(s): "CK" ------------------------------------------------------------------------------------------------------------------    Component Value Date/Time   BNP 60.0 02/28/2023 0030    CBG: No results for input(s): "GLUCAP" in the last 168 hours.  No results found for this or any previous visit (from the past 240 hours).   Radiology Studies: CT ABDOMEN PELVIS W CONTRAST Result Date: 09/01/2023 CLINICAL DATA:  Evaluate for bowel obstruction. EXAM: CT ABDOMEN AND PELVIS WITH CONTRAST TECHNIQUE: Multidetector CT imaging of the abdomen and pelvis was performed using the standard protocol following bolus administration of intravenous contrast. RADIATION DOSE REDUCTION: This exam was performed according to the departmental dose-optimization program which includes automated exposure control, adjustment of the mA and/or kV according to patient size and/or use of iterative reconstruction technique. CONTRAST:  OMNIPAQUE IOHEXOL 300 MG/ML  SOLN COMPARISON:  CT abdomen and pelvis 08/14/2023. FINDINGS: Lower chest: No acute abnormality. Hepatobiliary: Common bile duct stent is unchanged in position. Pneumobilia is unchanged. There are few hypodense lesions in the liver measuring up to 16 mm which are unchanged from prior. Gallstones are present. There is inflammatory stranding surrounding the gallbladder. Pancreas: Ill-defined hypodense pancreatic head mass appears grossly unchanged. The body and tail of the pancreas are atrophic with ductal dilatation similar to prior. There is no new acute inflammation or fluid collection. Spleen: Normal in size without focal abnormality. Adrenals/Urinary Tract: 18 mm left renal cyst is again noted. There are punctate calcifications in the right kidney. There is no hydronephrosis. Adrenal glands and bladder are within normal limits. Stomach/Bowel:  Sigmoid colon anastomosis is again noted. There are scattered colonic diverticula. The appendix and small bowel are within normal limits. Partial gastrectomy changes are present. The stomach is dilated with air-fluid level. There some wall thickening of the duodenal with mild surrounding inflammation. Vascular/Lymphatic: Aorta and IVC are normal in size. There is narrowing or occlusion of the portal vein near the portal confluence, unchanged. No enlarged lymph nodes are identified. Reproductive: Prostate radiotherapy seeds are present. Other: There surgical clips in the anterior abdominal wall. There is mild inflammatory stranding in the subcutaneous tissues of the anterior abdominal wall. There are small fat containing bilateral inguinal hernias. Musculoskeletal: Multilevel degenerative changes affect the spine. IMPRESSION: 1. Cholelithiasis with inflammatory stranding surrounding the gallbladder. Findings are worrisome for acute cholecystitis. 2. The stomach is  dilated which may represent gastritis or obstruction. There is inflammatory stranding and wall thickening involving the duodenal which may represent duodenitis. Tumor involvement not excluded. 3. Common bile duct stent is unchanged in position. 4. Stable hypodense lesions in the liver. 5. Stable pancreatic head mass. 6. Stable narrowing or occlusion of the portal vein near the portal confluence. 7. Nonobstructing right renal stones. 8. Colonic diverticulosis. 9. Mild inflammatory stranding in the subcutaneous tissues of the anterior abdominal wall. Correlate clinically for cellulitis. Electronically Signed   By: Darliss Cheney M.D.   On: 09/01/2023 17:46   DG Chest Portable 1 View Result Date: 09/01/2023 CLINICAL DATA:  Weakness. EXAM: PORTABLE CHEST 1 VIEW COMPARISON:  February 27, 2024. FINDINGS: Stable cardiomediastinal silhouette. Left-sided pacemaker is unchanged. Right internal jugular Port-A-Cath is unchanged. Hypoinflation of the lungs is noted with  probable minimal bibasilar subsegmental atelectasis. Bony thorax is unremarkable. IMPRESSION: Hypoinflation of the lungs with probable minimal bibasilar subsegmental atelectasis. Electronically Signed   By: Lupita Raider M.D.   On: 09/01/2023 17:05     Pamella Pert, MD, PhD [facility]  Between 7 am - 7 pm I am available, please contact me via Amion (for emergencies) or Securechat (non urgent messages)  Between 7 pm - 7 am I am not available, please contact night coverage MD/APP via Amion

## 2023-09-02 NOTE — Progress Notes (Addendum)
 [facility] Progress Note     Subjective: CC:  Denies abdominal pain or nausea/vomiting at present. Was vomiting at home. Confrms history of abdominal distention/nausea for 3-4 weeks.    Objective: Vital signs in last 24 hours: Temp:  [97.5 F (36.4 C)-98.1 F (36.7 C)] 97.6 F (36.4 C) (02/28 0609) Pulse Rate:  [59-61] 61 (02/28 0609) Resp:  [14-20] 18 (02/28 0609) BP: (122-152)/(63-110) 146/78 (02/28 0609) SpO2:  [97 %-100 %] 98 % (02/28 0609) Weight:  [99.5 kg] 99.5 kg (02/28 0400) Last BM Date : 09/01/23  Intake/Output from previous day: 02/27 0701 - 02/28 0700 In: 748.5 [I.V.:202.5; IV Piggyback:545.9] Out: 1550 [Urine:1550] Intake/Output this shift: Total I/O In: 3 [I.V.:3] Out: -   PE: Gen:  Alert, NAD, chronically ill appearing and pale Card:  Regular rate and rhythm Pulm:  Normal effort Abd: protuberant, distended, soft, TTP RUQ without guarding, no palpable hepatosplenomegaly  Skin: warm and dry, no rashes  Psych: A&Ox3   Lab Results:  Recent Labs    09/01/23 1447 09/01/23 1454 09/02/23 0215  WBC 3.6*  --  3.1*  HGB 9.3* 9.9* 9.0*  HCT 30.2* 29.0* 29.4*  PLT 150  --  138*   BMET Recent Labs    09/01/23 1447 09/01/23 1454 09/02/23 0215  NA 134* 138 135  K 3.6 3.7 4.0  CL 100 101 105  CO2 26  --  24  GLUCOSE 111* 105* 142*  BUN 24* 22 21  CREATININE 1.11 1.20 1.14  CALCIUM 8.7*  --  8.6*   PT/INR No results for input(s): "LABPROT", "INR" in the last 72 hours. CMP     Component Value Date/Time   NA 135 09/02/2023 0215   NA 144 01/18/2023 1209   K 4.0 09/02/2023 0215   CL 105 09/02/2023 0215   CO2 24 09/02/2023 0215   GLUCOSE 142 (H) 09/02/2023 0215   BUN 21 09/02/2023 0215   BUN 20 01/18/2023 1209   CREATININE 1.14 09/02/2023 0215   CREATININE 1.11 09/01/2023 1032   CREATININE 1.24 01/01/2011 1609   CALCIUM 8.6 (L) 09/02/2023 0215   PROT 5.9 (L) 09/02/2023 0215   ALBUMIN 2.8 (L) 09/02/2023 0215   AST 20 09/02/2023  0215   AST 19 09/01/2023 1032   ALT 17 09/02/2023 0215   ALT 15 09/01/2023 1032   ALKPHOS 92 09/02/2023 0215   BILITOT 0.6 09/02/2023 0215   BILITOT 0.4 09/01/2023 1032   GFRNONAA >60 09/02/2023 0215   GFRNONAA >60 09/01/2023 1032   GFRAA >60 02/07/2019 1446   Lipase     Component Value Date/Time   LIPASE 32 09/01/2023 1447       Studies/Results: CT ABDOMEN PELVIS W CONTRAST Result Date: 09/01/2023 CLINICAL DATA:  Evaluate for bowel obstruction. EXAM: CT ABDOMEN AND PELVIS WITH CONTRAST TECHNIQUE: Multidetector CT imaging of the abdomen and pelvis was performed using the standard protocol following bolus administration of intravenous contrast. RADIATION DOSE REDUCTION: This exam was performed according to the departmental dose-optimization program which includes automated exposure control, adjustment of the mA and/or kV according to patient size and/or use of iterative reconstruction technique. CONTRAST:  OMNIPAQUE IOHEXOL 300 MG/ML  SOLN COMPARISON:  CT abdomen and pelvis 08/14/2023. FINDINGS: Lower chest: No acute abnormality. Hepatobiliary: Common bile duct stent is unchanged in position. Pneumobilia is unchanged. There are few hypodense lesions in the liver measuring up to 16 mm which are unchanged from prior. Gallstones are present. There is inflammatory stranding surrounding the gallbladder. Pancreas: Ill-defined  hypodense pancreatic head mass appears grossly unchanged. The body and tail of the pancreas are atrophic with ductal dilatation similar to prior. There is no new acute inflammation or fluid collection. Spleen: Normal in size without focal abnormality. Adrenals/Urinary Tract: 18 mm left renal cyst is again noted. There are punctate calcifications in the right kidney. There is no hydronephrosis. Adrenal glands and bladder are within normal limits. Stomach/Bowel: Sigmoid colon anastomosis is again noted. There are scattered colonic diverticula. The appendix and small bowel are  within normal limits. Partial gastrectomy changes are present. The stomach is dilated with air-fluid level. There some wall thickening of the duodenal with mild surrounding inflammation. Vascular/Lymphatic: Aorta and IVC are normal in size. There is narrowing or occlusion of the portal vein near the portal confluence, unchanged. No enlarged lymph nodes are identified. Reproductive: Prostate radiotherapy seeds are present. Other: There surgical clips in the anterior abdominal wall. There is mild inflammatory stranding in the subcutaneous tissues of the anterior abdominal wall. There are small fat containing bilateral inguinal hernias. Musculoskeletal: Multilevel degenerative changes affect the spine. IMPRESSION: 1. Cholelithiasis with inflammatory stranding surrounding the gallbladder. Findings are worrisome for acute cholecystitis. 2. The stomach is dilated which may represent gastritis or obstruction. There is inflammatory stranding and wall thickening involving the duodenal which may represent duodenitis. Tumor involvement not excluded. 3. Common bile duct stent is unchanged in position. 4. Stable hypodense lesions in the liver. 5. Stable pancreatic head mass. 6. Stable narrowing or occlusion of the portal vein near the portal confluence. 7. Nonobstructing right renal stones. 8. Colonic diverticulosis. 9. Mild inflammatory stranding in the subcutaneous tissues of the anterior abdominal wall. Correlate clinically for cellulitis. Electronically Signed   By: Darliss Cheney M.D.   On: 09/01/2023 17:46   DG Chest Portable 1 View Result Date: 09/01/2023 CLINICAL DATA:  Weakness. EXAM: PORTABLE CHEST 1 VIEW COMPARISON:  February 27, 2024. FINDINGS: Stable cardiomediastinal silhouette. Left-sided pacemaker is unchanged. Right internal jugular Port-A-Cath is unchanged. Hypoinflation of the lungs is noted with probable minimal bibasilar subsegmental atelectasis. Bony thorax is unremarkable. IMPRESSION: Hypoinflation of the  lungs with probable minimal bibasilar subsegmental atelectasis. Electronically Signed   By: Lupita Raider M.D.   On: 09/01/2023 17:05    Anti-infectives: Anti-infectives (From admission, onward)    Start     Dose/Rate Route Frequency Ordered Stop   09/02/23 0400  piperacillin-tazobactam (ZOSYN) IVPB 3.375 g        3.375 g 12.5 mL/hr over 240 Minutes Intravenous Every 8 hours 09/01/23 2000     09/01/23 1900  piperacillin-tazobactam (ZOSYN) IVPB 3.375 g        3.375 g 100 mL/hr over 30 Minutes Intravenous  Once 09/01/23 1846 09/01/23 2006        Assessment/Plan Pancreatic adenocarcinoma, started as cT2N0 12/2022, s/p gem/abraxane 7-06/2023. Supposed to resume chemoradiation w/ Xeloda in march Gastric outlet obstruction Duodenitis Cholecystitis Portal vein occlusion  -afebrile, VSS, WBC 3.1 - HIDA pending to confirm suspected cholecystitis. He is not surgical candidate for cholecystectomy due to his pancreatic cancer with signs of tumor progression. Will need percutaneous cholecystostomy tube.  - regarding GOO, I do think he would benefit from NG decompression. I have ordered NG tube to be placed under fluoro. - appreciate GI consult for endoscopy/consideration of stent, possible endoscopy by Dr. Meridee Score on Monday - continue NPO - would benefit from palliative care consult. May eventually need discussions about a venting gastrostomy tube and feeding access.    Biliary obstruction secondary  to pancreatic cancer-status post ERCP/stent placement 12/13/2022 and stent exchange in august CAD Atrial fibrillation Gout Sleep apnea Status post bariatric surgery Hypertension GERD History of colon cancer in his 40s Complete heart block-pacemaker placed   LOS: 1 day   I reviewed nursing notes, hospitalist notes, last 24 h vitals and pain scores, last 48 h intake and output, last 24 h labs and trends, and last 24 h imaging results.  This care required moderate level of medical  decision making.   Hosie Spangle, PA-C [facility] Please see Amion for pager number during day hours 7:00am-4:30pm

## 2023-09-03 ENCOUNTER — Inpatient Hospital Stay (HOSPITAL_COMMUNITY)

## 2023-09-03 DIAGNOSIS — K810 Acute cholecystitis: Secondary | ICD-10-CM | POA: Diagnosis not present

## 2023-09-03 HISTORY — PX: IR PERC CHOLECYSTOSTOMY: IMG2326

## 2023-09-03 LAB — COMPREHENSIVE METABOLIC PANEL
ALT: 16 U/L (ref 0–44)
AST: 18 U/L (ref 15–41)
Albumin: 2.8 g/dL — ABNORMAL LOW (ref 3.5–5.0)
Alkaline Phosphatase: 85 U/L (ref 38–126)
Anion gap: 9 (ref 5–15)
BUN: 16 mg/dL (ref 8–23)
CO2: 22 mmol/L (ref 22–32)
Calcium: 8.8 mg/dL — ABNORMAL LOW (ref 8.9–10.3)
Chloride: 107 mmol/L (ref 98–111)
Creatinine, Ser: 1.02 mg/dL (ref 0.61–1.24)
GFR, Estimated: >60 mL/min (ref >60)
Glucose, Bld: 105 mg/dL — ABNORMAL HIGH (ref 70–99)
Potassium: 3.6 mmol/L (ref 3.5–5.1)
Sodium: 138 mmol/L (ref 135–145)
Total Bilirubin: 0.7 mg/dL (ref 0.0–1.2)
Total Protein: 6 g/dL — ABNORMAL LOW (ref 6.5–8.1)

## 2023-09-03 LAB — CBC
HCT: 30.2 % — ABNORMAL LOW (ref 39.0–52.0)
Hemoglobin: 9.2 g/dL — ABNORMAL LOW (ref 13.0–17.0)
MCH: 27.4 pg (ref 26.0–34.0)
MCHC: 30.5 g/dL (ref 30.0–36.0)
MCV: 89.9 fL (ref 80.0–100.0)
Platelets: 149 10*3/uL — ABNORMAL LOW (ref 150–400)
RBC: 3.36 MIL/uL — ABNORMAL LOW (ref 4.22–5.81)
RDW: 16.9 % — ABNORMAL HIGH (ref 11.5–15.5)
WBC: 3.6 10*3/uL — ABNORMAL LOW (ref 4.0–10.5)
nRBC: 0 % (ref 0.0–0.2)

## 2023-09-03 LAB — MAGNESIUM: Magnesium: 2.1 mg/dL (ref 1.7–2.4)

## 2023-09-03 LAB — APTT
aPTT: 173 s (ref 24–36)
aPTT: 43 s — ABNORMAL HIGH (ref 24–36)

## 2023-09-03 LAB — HEPARIN LEVEL (UNFRACTIONATED): Heparin Unfractionated: >1.1 [IU]/mL — ABNORMAL HIGH (ref 0.30–0.70)

## 2023-09-03 LAB — PHOSPHORUS: Phosphorus: 3.7 mg/dL (ref 2.5–4.6)

## 2023-09-03 MED ORDER — MIDAZOLAM HCL 2 MG/2ML IJ SOLN
INTRAMUSCULAR | Status: AC | PRN
  Administered 2023-09-03 (×2): 1 mg via INTRAVENOUS

## 2023-09-03 MED ORDER — LIDOCAINE-EPINEPHRINE 1 %-1:100000 IJ SOLN
INTRAMUSCULAR | Status: AC
  Filled 2023-09-03: qty 1

## 2023-09-03 MED ORDER — SODIUM CHLORIDE 0.9% FLUSH
5.0000 mL | Freq: Three times a day (TID) | INTRAVENOUS | Status: DC
  Administered 2023-09-03 – 2023-09-07 (×13): 5 mL

## 2023-09-03 MED ORDER — OXYMETAZOLINE HCL 0.05 % NA SOLN
1.0000 | Freq: Two times a day (BID) | NASAL | Status: AC | PRN
  Administered 2023-09-03: 1 via NASAL
  Filled 2023-09-03: qty 15

## 2023-09-03 MED ORDER — DIPHENHYDRAMINE HCL 25 MG PO CAPS
25.0000 mg | ORAL_CAPSULE | Freq: Once | ORAL | Status: AC | PRN
  Administered 2023-09-03: 25 mg via ORAL
  Filled 2023-09-03: qty 1

## 2023-09-03 MED ORDER — SODIUM CHLORIDE 0.9 % IV SOLN: INTRAVENOUS | Status: AC

## 2023-09-03 MED ORDER — FENTANYL CITRATE (PF) 100 MCG/2ML IJ SOLN
INTRAMUSCULAR | Status: AC | PRN
  Administered 2023-09-03 (×2): 50 ug via INTRAVENOUS

## 2023-09-03 MED ORDER — FENTANYL CITRATE (PF) 100 MCG/2ML IJ SOLN
INTRAMUSCULAR | Status: AC
  Filled 2023-09-03: qty 2

## 2023-09-03 MED ORDER — OXYCODONE HCL 5 MG PO TABS
5.0000 mg | ORAL_TABLET | ORAL | Status: DC | PRN
  Administered 2023-09-03 – 2023-09-07 (×11): 5 mg
  Filled 2023-09-03 (×12): qty 1

## 2023-09-03 MED ORDER — WHITE PETROLATUM EX OINT
TOPICAL_OINTMENT | CUTANEOUS | Status: DC | PRN
  Filled 2023-09-03: qty 5

## 2023-09-03 MED ORDER — MIDAZOLAM HCL 2 MG/2ML IJ SOLN
INTRAMUSCULAR | Status: AC
  Filled 2023-09-03: qty 2

## 2023-09-03 MED ORDER — IOHEXOL 300 MG/ML  SOLN
50.0000 mL | Freq: Once | INTRAMUSCULAR | Status: AC | PRN
  Administered 2023-09-03: 20 mL

## 2023-09-03 MED ORDER — HEPARIN (PORCINE) 25000 UT/250ML-% IV SOLN
1200.0000 [IU]/h | INTRAVENOUS | Status: DC
  Administered 2023-09-03 – 2023-09-04 (×2): 1200 [IU]/h via INTRAVENOUS
  Filled 2023-09-03 (×2): qty 250

## 2023-09-03 MED ORDER — LIDOCAINE-EPINEPHRINE 1 %-1:100000 IJ SOLN
20.0000 mL | Freq: Once | INTRAMUSCULAR | Status: AC
  Administered 2023-09-03: 20 mL via INTRADERMAL
  Filled 2023-09-03: qty 20

## 2023-09-03 NOTE — Procedures (Addendum)
 Interventional Radiology Procedure Note  Procedure:  Image guided percutaneous cholecystostomy tube placement  Findings: Please refer to procedural dictation for full description. 10 Fr perc chole, to bag drainage.  Sample sent for culture.  Complications: None immediate  Estimated Blood Loss: < 5 ml  Recommendations: Keep to bag drainage. Follow culture. OK to resume heparin gtt in 2 hours if no signs of bleeding into perc chole bag. IR will follow.   Marliss Coots, MD

## 2023-09-03 NOTE — Progress Notes (Signed)
 [facility] Progress Note     Subjective: CC:  No acute events overnight   Objective: Vital signs in last 24 hours: Temp:  [97.8 F (36.6 C)-98.2 F (36.8 C)] 97.8 F (36.6 C) (03/01 0546) Pulse Rate:  [58-75] 75 (03/01 0820) Resp:  [10-18] 11 (03/01 0820) BP: (138-173)/(63-81) 138/63 (03/01 0820) SpO2:  [94 %-98 %] 94 % (03/01 0820) Weight:  [97 kg] 97 kg (03/01 0500) Last BM Date : 09/01/23  Intake/Output from previous day: 02/28 0701 - 03/01 0700 In: 945 [I.V.:797.7; IV Piggyback:147.3] Out: 950 [Urine:870; Emesis/NG output:80] Intake/Output this shift: No intake/output data recorded.  PE: Gen:  Alert, NAD, chronically ill appearing and pale Abd: protuberant, distended, soft, TTP RUQ without guarding Skin: warm and dry, no rashes  Psych: A&Ox3   Lab Results:  Recent Labs    09/02/23 0215 09/03/23 0404  WBC 3.1* 3.6*  HGB 9.0* 9.2*  HCT 29.4* 30.2*  PLT 138* 149*   BMET Recent Labs    09/02/23 0215 09/03/23 0404  NA 135 138  K 4.0 3.6  CL 105 107  CO2 24 22  GLUCOSE 142* 105*  BUN 21 16  CREATININE 1.14 1.02  CALCIUM 8.6* 8.8*   PT/INR Recent Labs    09/02/23 1747  LABPROT 17.1*  INR 1.4*   CMP     Component Value Date/Time   NA 138 09/03/2023 0404   NA 144 01/18/2023 1209   K 3.6 09/03/2023 0404   CL 107 09/03/2023 0404   CO2 22 09/03/2023 0404   GLUCOSE 105 (H) 09/03/2023 0404   BUN 16 09/03/2023 0404   BUN 20 01/18/2023 1209   CREATININE 1.02 09/03/2023 0404   CREATININE 1.11 09/01/2023 1032   CREATININE 1.24 01/01/2011 1609   CALCIUM 8.8 (L) 09/03/2023 0404   PROT 6.0 (L) 09/03/2023 0404   ALBUMIN 2.8 (L) 09/03/2023 0404   AST 18 09/03/2023 0404   AST 19 09/01/2023 1032   ALT 16 09/03/2023 0404   ALT 15 09/01/2023 1032   ALKPHOS 85 09/03/2023 0404   BILITOT 0.7 09/03/2023 0404   BILITOT 0.4 09/01/2023 1032   GFRNONAA >60 09/03/2023 0404   GFRNONAA >60 09/01/2023 1032   GFRAA >60 02/07/2019 1446   Lipase      Component Value Date/Time   LIPASE 32 09/01/2023 1447       Studies/Results: DG INTRO LONG GI TUBE Result Date: 09/02/2023 CLINICAL DATA:  Gastric outlet obstruction. EXAM: FL FEEDING TUBE PLACEMENT CONTRAST:  0 cc FLUOROSCOPY: Fluoroscopy Time:  1 minute Radiation Exposure Index (if provided by the fluoroscopic device): 18 Number of Acquired Spot Images: 0 COMPARISON:  CT 03/31/2024 FINDINGS: NG tube advanced into the distal stomach. Tube passes multiple surgical clips within the stomach suggesting passage through gastrostomy. IMPRESSION: NG tube advanced into distal stomach. Electronically Signed   By: Genevive Bi M.D.   On: 09/02/2023 16:54   NM Hepatobiliary Liver Func Result Date: 09/02/2023 CLINICAL DATA:  Concern for cholecystitis. Gallbladder inflammation on CT EXAM: NUCLEAR MEDICINE HEPATOBILIARY IMAGING TECHNIQUE: Sequential images of the abdomen were obtained out to 60 minutes following intravenous administration of radiopharmaceutical. RADIOPHARMACEUTICALS:  5.2 mCi Tc-13m  Choletec IV COMPARISON:  CT 09/01/2023 FINDINGS: Prompt clearance radiotracer from blood pool and homogeneous uptake liver. Counts are evident in the small bowel by 20 minutes. Gallbladder fails to fill at 60 minutes. 3 mg IV morphine was administered to augment filling of the gallbladder. Gallbladder failed to fill following morphine augmentation. IMPRESSION: 1. Non  filling the gallbladder is concerning for acute cholecystitis. Of note, patient has not eaten in days which can produce false positive findings. 2. Patent common bile duct. These results will be called to the ordering clinician or representative by the Radiologist Assistant, and communication documented in the PACS or [employer]. Electronically Signed   By: Genevive Bi M.D.   On: 09/02/2023 15:35   CT ABDOMEN PELVIS W CONTRAST Result Date: 09/01/2023 CLINICAL DATA:  Evaluate for bowel obstruction. EXAM: CT ABDOMEN AND PELVIS WITH  CONTRAST TECHNIQUE: Multidetector CT imaging of the abdomen and pelvis was performed using the standard protocol following bolus administration of intravenous contrast. RADIATION DOSE REDUCTION: This exam was performed according to the departmental dose-optimization program which includes automated exposure control, adjustment of the mA and/or kV according to patient size and/or use of iterative reconstruction technique. CONTRAST:  OMNIPAQUE IOHEXOL 300 MG/ML  SOLN COMPARISON:  CT abdomen and pelvis 08/14/2023. FINDINGS: Lower chest: No acute abnormality. Hepatobiliary: Common bile duct stent is unchanged in position. Pneumobilia is unchanged. There are few hypodense lesions in the liver measuring up to 16 mm which are unchanged from prior. Gallstones are present. There is inflammatory stranding surrounding the gallbladder. Pancreas: Ill-defined hypodense pancreatic head mass appears grossly unchanged. The body and tail of the pancreas are atrophic with ductal dilatation similar to prior. There is no new acute inflammation or fluid collection. Spleen: Normal in size without focal abnormality. Adrenals/Urinary Tract: 18 mm left renal cyst is again noted. There are punctate calcifications in the right kidney. There is no hydronephrosis. Adrenal glands and bladder are within normal limits. Stomach/Bowel: Sigmoid colon anastomosis is again noted. There are scattered colonic diverticula. The appendix and small bowel are within normal limits. Partial gastrectomy changes are present. The stomach is dilated with air-fluid level. There some wall thickening of the duodenal with mild surrounding inflammation. Vascular/Lymphatic: Aorta and IVC are normal in size. There is narrowing or occlusion of the portal vein near the portal confluence, unchanged. No enlarged lymph nodes are identified. Reproductive: Prostate radiotherapy seeds are present. Other: There surgical clips in the anterior abdominal wall. There is mild  inflammatory stranding in the subcutaneous tissues of the anterior abdominal wall. There are small fat containing bilateral inguinal hernias. Musculoskeletal: Multilevel degenerative changes affect the spine. IMPRESSION: 1. Cholelithiasis with inflammatory stranding surrounding the gallbladder. Findings are worrisome for acute cholecystitis. 2. The stomach is dilated which may represent gastritis or obstruction. There is inflammatory stranding and wall thickening involving the duodenal which may represent duodenitis. Tumor involvement not excluded. 3. Common bile duct stent is unchanged in position. 4. Stable hypodense lesions in the liver. 5. Stable pancreatic head mass. 6. Stable narrowing or occlusion of the portal vein near the portal confluence. 7. Nonobstructing right renal stones. 8. Colonic diverticulosis. 9. Mild inflammatory stranding in the subcutaneous tissues of the anterior abdominal wall. Correlate clinically for cellulitis. Electronically Signed   By: Darliss Cheney M.D.   On: 09/01/2023 17:46   DG Chest Portable 1 View Result Date: 09/01/2023 CLINICAL DATA:  Weakness. EXAM: PORTABLE CHEST 1 VIEW COMPARISON:  February 27, 2024. FINDINGS: Stable cardiomediastinal silhouette. Left-sided pacemaker is unchanged. Right internal jugular Port-A-Cath is unchanged. Hypoinflation of the lungs is noted with probable minimal bibasilar subsegmental atelectasis. Bony thorax is unremarkable. IMPRESSION: Hypoinflation of the lungs with probable minimal bibasilar subsegmental atelectasis. Electronically Signed   By: Lupita Raider M.D.   On: 09/01/2023 17:05    Anti-infectives: Anti-infectives (From admission, onward)  Start     Dose/Rate Route Frequency Ordered Stop   09/02/23 0400  piperacillin-tazobactam (ZOSYN) IVPB 3.375 g        3.375 g 12.5 mL/hr over 240 Minutes Intravenous Every 8 hours 09/01/23 2000     09/01/23 1900  piperacillin-tazobactam (ZOSYN) IVPB 3.375 g        3.375 g 100 mL/hr over  30 Minutes Intravenous  Once 09/01/23 1846 09/01/23 2006        Assessment/Plan Pancreatic adenocarcinoma, started as cT2N0 12/2022, s/p gem/abraxane 7-06/2023. Supposed to resume chemoradiation w/ Xeloda in march Gastric outlet obstruction Duodenitis Cholecystitis Portal vein occlusion  -afebrile, VSS, WBC WNL - HIDA shows suspected cholecystitis. He is not surgical candidate for cholecystectomy due to his pancreatic cancer with signs of tumor progression. Percutaneous cholecystostomy tube placed 3/1 - regarding GOO, would benefit from NG decompression, unable to place on the floor.  Attempted in fluoro 2/28 - appreciate GI consult for endoscopy/consideration of stent, possible endoscopy by Dr. Meridee Score on Monday - continue NPO - would benefit from palliative care consult. May eventually need discussions about a venting gastrostomy tube and feeding access.    Biliary obstruction secondary to pancreatic cancer-status post ERCP/stent placement 12/13/2022 and stent exchange in august CAD Atrial fibrillation Gout Sleep apnea Status post bariatric surgery Hypertension GERD History of colon cancer in his 40s Complete heart block-pacemaker placed   LOS: 2 days   I reviewed nursing notes, hospitalist notes, last 24 h vitals and pain scores, last 48 h intake and output, last 24 h labs and trends, and last 24 h imaging results.  This care required moderate level of medical decision making.    Vanita Panda, MD  Colorectal and General Surgery [facility] Surgery

## 2023-09-03 NOTE — Progress Notes (Signed)
 Pt coughed out small amt of blood in tissue. A. Chavez,Np made aware via secure chat. Also pharmacy made aware as well.

## 2023-09-03 NOTE — Progress Notes (Addendum)
 Pt noted with some nose bleeding to rt nare where the NG tube is. Notified on call A. Chavez,NP and also pharmacy since pt is on heparin drip. Instructed to pinch nostril. Place Ice pack to rt nare. Will continue to monitor.

## 2023-09-03 NOTE — Progress Notes (Signed)
   09/03/23 2200  BiPAP/CPAP/SIPAP  BiPAP/CPAP/SIPAP Pt Type Adult  Reason BIPAP/CPAP not in use NG tube in place

## 2023-09-03 NOTE — Plan of Care (Signed)
  Problem: Education: Goal: Knowledge of General Education information will improve Description: Including pain rating scale, medication(s)/side effects and non-pharmacologic comfort measures Outcome: Progressing   Problem: Clinical Measurements: Goal: Ability to maintain clinical measurements within normal limits will improve Outcome: Progressing Goal: Will remain free from infection Outcome: Progressing Goal: Cardiovascular complication will be avoided Outcome: Progressing   Problem: Nutrition: Goal: Adequate nutrition will be maintained Outcome: Progressing   Problem: Pain Managment: Goal: General experience of comfort will improve and/or be controlled Outcome: Progressing

## 2023-09-03 NOTE — Progress Notes (Signed)
 PHARMACY - ANTICOAGULATION CONSULT NOTE  Pharmacy Consult for heparin Indication:  atrial fibrillation (apixaban on hold)  No Known Allergies  Patient Measurements: Weight: 97 kg (213 lb 13.5 oz) Heparin Dosing Weight: 89 kg  Vital Signs: Temp: 98.8 F (37.1 C) (03/01 1320) Temp Source: Oral (03/01 1320) BP: 142/64 (03/01 1320) Pulse Rate: 91 (03/01 1320)  Labs: Recent Labs    09/01/23 1447 09/01/23 1454 09/01/23 1454 09/01/23 2120 09/02/23 0215 09/02/23 1130 09/02/23 1747 09/03/23 0404 09/03/23 1549  HGB 9.3* 9.9*  --   --  9.0*  --   --  9.2*  --   HCT 30.2* 29.0*  --   --  29.4*  --   --  30.2*  --   PLT 150  --   --   --  138*  --   --  149*  --   APTT  --   --    < > 36 89* 97*  --  173* 43*  LABPROT  --   --   --   --   --   --  17.1*  --   --   INR  --   --   --   --   --   --  1.4*  --   --   HEPARINUNFRC  --   --   --  >1.10*  --   --   --  >1.10*  --   CREATININE 1.11 1.20  --   --  1.14  --   --  1.02  --    < > = values in this interval not displayed.    Estimated Creatinine Clearance: 61.9 mL/min (by C-G formula based on SCr of 1.02 mg/dL).   Medical History: Past Medical History:  Diagnosis Date   Acute meniscal tear of knee left   Arthritis    generalized   AV block 08/01/2018   Benign essential hypertension 01/18/2007   Bronchiectasis with acute exacerbation 06/23/2020   Chest pain, atypical 08/23/2014   Chronic cough 10/07/2014   Coronary artery disease    Degeneration of lumbar intervertebral disc 10/14/2017   Diverticulosis of colon 07/07/2005   Elevated PSA 06/19/2014   GERD (gastroesophageal reflux disease) 01/18/2007   Gout, unspecified 01/18/2007   Hemorrhoids 01/19/2011   Hiatal hernia    History of colonic polyps 01/18/2007   Insomnia, unspecified 08/21/2007   Iron deficiency anemia, unspecified  01/19/2011   Lumbar post-laminectomy syndrome 10/14/2017   Lumbar spondylolysis    Lumbar strain 12/14/2011   Obstructive sleep  apnea 01/18/2007   uses CPAP nightly   Paraesophageal hernia    large   Primary osteoarthritis of knee 05/23/2015   Prostate cancer (HCC) 2016   S/P knee replacement 05/23/2015   Sensorineural hearing loss, bilateral    Vitamin B12 deficiency    Vitamin D deficiency 10/28/2009    Medications: Apixaban 5 mg PO BID PTA -Last dose: 2/26 PM  Assessment: Pt is an 40 yoM with PMH significant for pancreatic cancer s/p ERCP for obstructive jaundice. Pt chronically anticoagulated with apixaban for atrial fibrillation. Anticoagulation being transitioned to heparin - pharmacy to dose.   Today, 09/03/23 aPTT now SUBtherapeutic on current IV heparin rate of 1000 units/hr.  Verified heparin is infusing via peripheral IV site and lab was drawn via port. Initial heparin level>1.1 as anticipated due to interaction with apixaban CBC: Hgb 9.2, Plt 149 - low/stable RN reports nose-bleeding at NGT site x3 overnight but has been resolved  today.  No infusion related concerns reported by RN. Renal function stable  Goal of Therapy:  Heparin level 0.3-0.7 units/ml aPTT 66-102 seconds Monitor platelets by anticoagulation protocol: Yes   Plan:  Increase IV heparin from 1000 to 1200 units/hr Recheck aPTT in 8 hours at ~0130 CBC, heparin level/aPTT daily. Once heparin level and aPTT correlate, can monitor using heparin level only Monitor for signs of bleeding   If heparin needs to be held for any procedures, please provide pharmacy with instructions for when to hold.   Hessie Knows, PharmD, BCPS Secure Chat if ?s 09/03/2023 4:57 PM

## 2023-09-03 NOTE — Progress Notes (Signed)
 Gherghe MD verbal orders for clear liquids and clamp NGT as long as pt without nausea.

## 2023-09-03 NOTE — Progress Notes (Signed)
 PROGRESS NOTE  Marcus [name] VHQ:[id number] DOB: [birth year] DOA: 09/01/2023 PCP: [name], NP   LOS: 2 days   Brief Narrative / Interim history: 83 year old male with pancreatic cancer, obstructive jaundice with history of ERCP and stent placement, HTN, CAD, OSA, currently getting chemo comes into the hospital with few days of abdominal pain, nausea, decreased p.o. intake.  Imaging in the ED with a CT scan of the abdomen and pelvis showed cholelithiasis with inflammatory stranding around the gallbladder, also dilated stomach with inflammatory stranding around the duodenal area, could not exclude tumor involvement.  CBD stent was in place.  General surgery was consulted and he was admitted to the hospital  Subjective / 24h Interval events: Just returned from interventional radiology, had percutaneous cholecystostomy tube placed.  Complains of soreness  Assesement and Plan: Principal Problem:   Acute cholecystitis Active Problems:   Atrial fibrillation, chronic (HCC)   Pancreatic adenocarcinoma (HCC)   History of biliary duct stent placement   GERD (gastroesophageal reflux disease)   Cardiac pacemaker in situ   Biliary obstruction   Gastric outlet obstruction   Malignant neoplasm of head of pancreas (HCC)   Abnormal CT of the abdomen   Principal problem Concern for gastric outlet obstruction -continue n.p.o., IV fluids.  Gastroenterology consulted, appreciate input.  Looks like there are plans for an EGD on Monday.  This may be related to cancer progression -Continue NG tube, IV fluids and supportive care until the  Active problems Cholelithiasis, concern for acute cholecystitis-General Surgery consulted and following.  HIDA scan was positive for acute cholecystitis on 2/28, he is now status post percutaneous cholecystostomy 3/1.  Pancreatic cancer-receiving treatment as an outpatient.  Hold Xeloda now.  Oncology follow-up  Pancytopenia-in the setting of malignancy,  chemotherapy, counts are stable today  History of CAD, CHB status post pacemaker, chronic A-fib -Eliquis on hold, on heparin infusion meanwhile until plan is better determined.  OSA-continue CPAP  Bronchiectasis-stable, no symptoms  Obesity, class I-BMI 33  Scheduled Meds:  Chlorhexidine Gluconate Cloth  6 each Topical Daily   pantoprazole (PROTONIX) IV  40 mg Intravenous Q12H   sodium chloride flush  10-40 mL Intracatheter Q12H   sodium chloride flush  3 mL Intravenous Q12H   sodium chloride flush  5 mL Intracatheter Q8H   sodium chloride HYPERTONIC  4 mL Nebulization BID   Continuous Infusions:  sodium chloride 75 mL/hr at 09/03/23 0045   heparin Stopped (09/03/23 0810)   piperacillin-tazobactam (ZOSYN)  IV 3.375 g (09/03/23 0616)   PRN Meds:.acetaminophen **OR** acetaminophen, albuterol, bisacodyl, hydrALAZINE, menthol-cetylpyridinium, ondansetron **OR** ondansetron (ZOFRAN) IV, oxyCODONE, oxymetazoline, phenol, polyethylene glycol, sodium chloride flush, white petrolatum  Current Outpatient Medications  Medication Instructions   acetaminophen (TYLENOL) 650 mg, Oral, Every 6 hours PRN   allopurinol (ZYLOPRIM) 300 mg, Oral, Daily   amLODipine (NORVASC) 10 mg, Oral, Daily   bisacodyl (DULCOLAX) 10 mg, Oral, Daily PRN   Calcium Carb-Cholecalciferol (CALCIUM 600 + D PO) 1 tablet, Daily   capecitabine (XELODA) 1,500 mg, Oral, 2 times daily after meals, Take Monday-Friday. Take only days of radiation.   Carboxymethylcellulose Sod PF 0.5 % SOLN 1 drop, Ophthalmic, 4 times daily   cyanocobalamin (VITAMIN B12) 500 MCG tablet 1 tablet, Oral, 2 times daily   docusate sodium (COLACE) 100 mg, 2 times daily   Eliquis 5 mg, Oral, 2 times daily   Eszopiclone 3 mg, Oral, Daily at bedtime, Take immediately before bedtime   fluticasone (FLONASE) 50 MCG/ACT nasal spray 2 sprays,  Each Nare, Daily   glycerin adult 2 g suppository 1 suppository, Rectal, As needed   ipratropium (ATROVENT) 0.03 %  nasal spray 2 sprays, Each Nare, Every 12 hours   lidocaine-prilocaine (EMLA) cream 1 Application, Topical, As needed   mirabegron ER (MYRBETRIQ) 50 MG TB24 tablet 1 tablet, Oral, Daily   mirtazapine (REMERON) 15 mg, Oral, Daily at bedtime   ondansetron (ZOFRAN) 4 mg, Oral, Every 4 hours PRN   oxybutynin (DITROPAN) 5 mg, Oral, 2 times daily PRN   oxyCODONE (ROXICODONE) 5 mg, Oral, Every 6 hours PRN   polyethylene glycol powder (GLYCOLAX/MIRALAX) 17 GM/SCOOP powder Mix 4 capfuls in a 32 oz Gatorade, Can repeat the next day for a satisfactory bowel movement. THEN one capful daily for maintenance   Potassium Gluconate 550 mg, Oral, Daily   Respiratory Therapy Supplies (FLUTTER) DEVI 1 each, Does not apply, 2 times daily   rosuvastatin (CRESTOR) 20 mg, Oral, Daily   sodium chloride HYPERTONIC 3 % nebulizer solution Nebulization, As needed   tamsulosin (FLOMAX) 0.4 MG CAPS capsule 1 capsule, Oral, Daily   traZODone (DESYREL) 50 MG tablet 1 tablet, Oral, At bedtime PRN   valsartan-hydrochlorothiazide (DIOVAN-HCT) 160-25 MG tablet 1 tablet, Daily    Diet Orders (From admission, onward)     Start     Ordered   09/03/23 0001  Diet NPO time specified  Diet effective midnight       Comments:     09/02/23 1702            DVT prophylaxis: SCDs Start: 09/01/23 1934   Lab Results  Component Value Date   PLT 149 (L) 09/03/2023      Code Status: Limited: Do not attempt resuscitation (DNR) -DNR-LIMITED -Do Not Intubate/DNI   Family Communication: Daughter at bedside  Status is: Inpatient Remains inpatient appropriate because: Severity of illness   Level of care: Med-Surg  Consultants:  General surgery Gastroenterology Oncology  Objective: Vitals:   09/03/23 0810 09/03/23 0815 09/03/23 0820 09/03/23 0840  BP: (!) 152/78 (!) 150/64 138/63 (!) 138/54  Pulse: 60 67 75 81  Resp: 11 14 11 16   Temp:    (!) 97.5 F (36.4 C)  TempSrc:    Oral  SpO2: 97% 95% 94% 95%  Weight:         Intake/Output Summary (Last 24 hours) at 09/03/2023 1027 Last data filed at 09/03/2023 0945 Gross per 24 hour  Intake 945.02 ml  Output 1215 ml  Net -269.98 ml   Wt Readings from Last 3 Encounters:  09/03/23 97 kg  09/01/23 98.6 kg  08/25/23 98 kg    Examination:  Constitutional: NAD Eyes: lids and conjunctivae normal, no scleral icterus ENMT: mmm Neck: normal, supple Respiratory: clear to auscultation bilaterally, no wheezing, no crackles.  Cardiovascular: Regular rate and rhythm, no murmurs / rubs / gallops. No LE edema. Abdomen: soft, no distention, no tenderness. Bowel sounds positive.    Data Reviewed: I have independently reviewed following labs and imaging studies   CBC Recent Labs  Lab 09/01/23 1032 09/01/23 1447 09/01/23 1454 09/02/23 0215 09/03/23 0404  WBC 3.8* 3.6*  --  3.1* 3.6*  HGB 9.7* 9.3* 9.9* 9.0* 9.2*  HCT 30.3* 30.2* 29.0* 29.4* 30.2*  PLT 174 150  --  138* 149*  MCV 85.6 87.5  --  89.9 89.9  MCH 27.4 27.0  --  27.5 27.4  MCHC 32.0 30.8  --  30.6 30.5  RDW 16.7* 16.7*  --  16.7* 16.9*  LYMPHSABS 0.7 0.6*  --   --   --   MONOABS 0.5 0.4  --   --   --   EOSABS 0.1 0.1  --   --   --   BASOSABS 0.0 0.0  --   --   --     Recent Labs  Lab 09/01/23 1032 09/01/23 1447 09/01/23 1454 09/02/23 0215 09/02/23 1747 09/03/23 0404  NA 137 134* 138 135  --  138  K 3.8 3.6 3.7 4.0  --  3.6  CL 100 100 101 105  --  107  CO2 29 26  --  24  --  22  GLUCOSE 125* 111* 105* 142*  --  105*  BUN 25* 24* 22 21  --  16  CREATININE 1.11 1.11 1.20 1.14  --  1.02  CALCIUM 9.0 8.7*  --  8.6*  --  8.8*  AST 19 22  --  20  --  18  ALT 15 19  --  17  --  16  ALKPHOS 96 94  --  92  --  85  BILITOT 0.4 0.4  --  0.6  --  0.7  ALBUMIN 3.6 3.1*  --  2.8*  --  2.8*  MG  --   --   --  2.1  --  2.1  INR  --   --   --   --  1.4*  --     ------------------------------------------------------------------------------------------------------------------ No results for  input(s): "CHOL", "HDL", "LDLCALC", "TRIG", "CHOLHDL", "LDLDIRECT" in the last 72 hours.  Lab Results  Component Value Date   HGBA1C 6.3 11/24/2022   ------------------------------------------------------------------------------------------------------------------ No results for input(s): "TSH", "T4TOTAL", "T3FREE", "THYROIDAB" in the last 72 hours.  Invalid input(s): "FREET3"  Cardiac Enzymes No results for input(s): "CKMB", "TROPONINI", "MYOGLOBIN" in the last 168 hours.  Invalid input(s): "CK" ------------------------------------------------------------------------------------------------------------------    Component Value Date/Time   BNP 60.0 02/28/2023 0030    CBG: No results for input(s): "GLUCAP" in the last 168 hours.  No results found for this or any previous visit (from the past 240 hours).   Radiology Studies: IR Perc Cholecystostomy Result Date: 09/03/2023 INDICATION: 83 year old male with history of pancreatic adenocarcinoma and common bile duct stent placement with clinical and imaging findings of acute cholecystitis. EXAM: Ultrasound and fluoroscopic guided percutaneous cholecystostomy tube placement. MEDICATIONS: The patient was currently receiving intravenous antibiotics as an inpatient, no additional antibiotics were administered. ANESTHESIA/SEDATION: Moderate (conscious) sedation was employed during this procedure. A total of Versed 2 mg and Fentanyl 100 mcg was administered intravenously. Moderate Sedation Time: 10 minutes. The patient's level of consciousness and vital signs were monitored continuously by radiology nursing throughout the procedure under my direct supervision. FLUOROSCOPY TIME:  Nineteen mGy COMPLICATIONS: None immediate. PROCEDURE: Informed written consent was obtained from the patient after a thorough discussion of the procedural risks, benefits and alternatives. All questions were addressed. Maximal Sterile Barrier Technique was utilized including  caps, mask, sterile gowns, sterile gloves, sterile drape, hand hygiene and skin antiseptic. A timeout was performed prior to the initiation of the procedure. The patient was placed supine on the angiographic table. The patient's right upper quadrant was then prepped and draped in normal sterile fashion with maximum sterile barrier. Ultrasound demonstrates a distended gallbladder. Subdermal Local anesthesia was provided at the planned skin entry site. Under ultrasound guidance, deeper local anesthetic was provided through intercostal muscles and along the liver capsule. Ultrasound was used to puncture the gallbladder using a 21 gauge  Chiba needle via a trans peritoneal approach with visualization of the lung treated to the gallbladder. A 0.018 inch wire was advanced into the lumen and a transition dilator placed. A gentle hand injection of contrast was performed. Cholecystogram demonstrates obstruction of the cystic duct. A 0.035 inch exchange wire was placed in the tract was dilated. A 10 French multipurpose drainage catheter was advanced into the gallbladder lumen. The drain was then secured in place using a 0-silk suture and a Stayfix device. A sterile dressing was applied. The tube was placed to bag drainage. A culture was sent to the lab for analysis. The patient tolerated procedure well without evidence of immediate complication was transferred back to the floor in stable condition. IMPRESSION: Successful placement of 10 French percutaneous, trans peritoneal cholecystostomy tube. Marliss Coots, MD [facility] Radiology Electronically Signed   By: Marliss Coots M.D.   On: 09/03/2023 09:25   DG INTRO LONG GI TUBE Result Date: 09/02/2023 CLINICAL DATA:  Gastric outlet obstruction. EXAM: FL FEEDING TUBE PLACEMENT CONTRAST:  0 cc FLUOROSCOPY: Fluoroscopy Time:  1 minute Radiation Exposure Index (if provided by the fluoroscopic device): 18 Number of Acquired Spot  Images: 0 COMPARISON:  CT 03/31/2024 FINDINGS: NG tube advanced into the distal stomach. Tube passes multiple surgical clips within the stomach suggesting passage through gastrostomy. IMPRESSION: NG tube advanced into distal stomach. Electronically Signed   By: Genevive Bi M.D.   On: 09/02/2023 16:54   NM Hepatobiliary Liver Func Result Date: 09/02/2023 CLINICAL DATA:  Concern for cholecystitis. Gallbladder inflammation on CT EXAM: NUCLEAR MEDICINE HEPATOBILIARY IMAGING TECHNIQUE: Sequential images of the abdomen were obtained out to 60 minutes following intravenous administration of radiopharmaceutical. RADIOPHARMACEUTICALS:  5.2 mCi Tc-30m  Choletec IV COMPARISON:  CT 09/01/2023 FINDINGS: Prompt clearance radiotracer from blood pool and homogeneous uptake liver. Counts are evident in the small bowel by 20 minutes. Gallbladder fails to fill at 60 minutes. 3 mg IV morphine was administered to augment filling of the gallbladder. Gallbladder failed to fill following morphine augmentation. IMPRESSION: 1. Non filling the gallbladder is concerning for acute cholecystitis. Of note, patient has not eaten in days which can produce false positive findings. 2. Patent common bile duct. These results will be called to the ordering clinician or representative by the Radiologist Assistant, and communication documented in the PACS or [employer]. Electronically Signed   By: Genevive Bi M.D.   On: 09/02/2023 15:35     Pamella Pert, MD, PhD [facility]  Between 7 am - 7 pm I am available, please contact me via Amion (for emergencies) or Securechat (non urgent messages)  Between 7 pm - 7 am I am not available, please contact night coverage MD/APP via Amion

## 2023-09-03 NOTE — Progress Notes (Signed)
 Secure chat with Elvera Lennox MD with verbal orders for a popsicle and sips of clear liquids.

## 2023-09-03 NOTE — Progress Notes (Signed)
 PHARMACY - ANTICOAGULATION CONSULT NOTE  Pharmacy Consult for heparin Indication:  atrial fibrillation (apixaban on hold)  No Known Allergies  Patient Measurements: Weight: 99.5 kg (219 lb 5.7 oz) Heparin Dosing Weight: 89 kg  Vital Signs: Temp: 98.2 F (36.8 C) (02/28 2013) Temp Source: Oral (02/28 2013) BP: 147/68 (02/28 2013) Pulse Rate: 60 (02/28 2013)  Labs: Recent Labs    09/01/23 1447 09/01/23 1454 09/01/23 1454 09/01/23 2120 09/02/23 0215 09/02/23 1130 09/02/23 1747 09/03/23 0404  HGB 9.3* 9.9*  --   --  9.0*  --   --  9.2*  HCT 30.2* 29.0*  --   --  29.4*  --   --  30.2*  PLT 150  --   --   --  138*  --   --  149*  APTT  --   --    < > 36 89* 97*  --  173*  LABPROT  --   --   --   --   --   --  17.1*  --   INR  --   --   --   --   --   --  1.4*  --   HEPARINUNFRC  --   --   --  >1.10*  --   --   --  >1.10*  CREATININE 1.11 1.20  --   --  1.14  --   --  1.02   < > = values in this interval not displayed.    Estimated Creatinine Clearance: 62.7 mL/min (by C-G formula based on SCr of 1.02 mg/dL).   Medical History: Past Medical History:  Diagnosis Date   Acute meniscal tear of knee left   Arthritis    generalized   AV block 08/01/2018   Benign essential hypertension 01/18/2007   Bronchiectasis with acute exacerbation 06/23/2020   Chest pain, atypical 08/23/2014   Chronic cough 10/07/2014   Coronary artery disease    Degeneration of lumbar intervertebral disc 10/14/2017   Diverticulosis of colon 07/07/2005   Elevated PSA 06/19/2014   GERD (gastroesophageal reflux disease) 01/18/2007   Gout, unspecified 01/18/2007   Hemorrhoids 01/19/2011   Hiatal hernia    History of colonic polyps 01/18/2007   Insomnia, unspecified 08/21/2007   Iron deficiency anemia, unspecified  01/19/2011   Lumbar post-laminectomy syndrome 10/14/2017   Lumbar spondylolysis    Lumbar strain 12/14/2011   Obstructive sleep apnea 01/18/2007   uses CPAP nightly    Paraesophageal hernia    large   Primary osteoarthritis of knee 05/23/2015   Prostate cancer (HCC) 2016   S/P knee replacement 05/23/2015   Sensorineural hearing loss, bilateral    Vitamin B12 deficiency    Vitamin D deficiency 10/28/2009    Medications: Apixaban 5 mg PO BID PTA -Last dose: 2/26 PM  Assessment: Pt is an 63 yoM with PMH significant for pancreatic cancer s/p ERCP for obstructive jaundice. Pt chronically anticoagulated with apixaban for atrial fibrillation. Anticoagulation being transitioned to heparin - pharmacy to dose.   Today, 09/03/23 aPTT therapeutic 173 sec- now supra-therapeutic on current IV heparin rate of 1300 units/hr.  Verified heparin is infusing via peripheral IV site and lab was drawn via port. Initial heparin level>1.1 as anticipated due to interaction with apixaban CBC: Hgb 9.2, Plt 149 - low/stable RN reports nose-bleeding at NGT site x3 since shift change.  No infusion related concerns reported by RN. Renal function stable  Goal of Therapy:  Heparin level 0.3-0.7 units/ml aPTT 66-102 seconds  Monitor platelets by anticoagulation protocol: Yes   Plan:  Hold heparin for ~1h. At 0630, resume heparin infusion at decreased rate of 1000 units/hr Recheck aPTT at 1430 CBC, heparin level/aPTT daily. Once heparin level and aPTT correlate, can monitor using heparin level only Monitor for signs of bleeding   If heparin needs to be held for any procedures, please provide pharmacy with instructions for when to hold.  Junita Push, PharmD, BCPS 09/03/2023 5:11 AM

## 2023-09-04 ENCOUNTER — Other Ambulatory Visit: Payer: Self-pay

## 2023-09-04 ENCOUNTER — Other Ambulatory Visit (HOSPITAL_COMMUNITY): Payer: Self-pay | Admitting: Radiology

## 2023-09-04 DIAGNOSIS — C259 Malignant neoplasm of pancreas, unspecified: Secondary | ICD-10-CM | POA: Diagnosis not present

## 2023-09-04 DIAGNOSIS — K311 Adult hypertrophic pyloric stenosis: Secondary | ICD-10-CM | POA: Diagnosis not present

## 2023-09-04 DIAGNOSIS — K810 Acute cholecystitis: Secondary | ICD-10-CM

## 2023-09-04 LAB — APTT
aPTT: 90 s — ABNORMAL HIGH (ref 24–36)
aPTT: 94 s — ABNORMAL HIGH (ref 24–36)

## 2023-09-04 LAB — CBC
HCT: 30 % — ABNORMAL LOW (ref 39.0–52.0)
Hemoglobin: 9.1 g/dL — ABNORMAL LOW (ref 13.0–17.0)
MCH: 27.1 pg (ref 26.0–34.0)
MCHC: 30.3 g/dL (ref 30.0–36.0)
MCV: 89.3 fL (ref 80.0–100.0)
Platelets: 145 10*3/uL — ABNORMAL LOW (ref 150–400)
RBC: 3.36 MIL/uL — ABNORMAL LOW (ref 4.22–5.81)
RDW: 17 % — ABNORMAL HIGH (ref 11.5–15.5)
WBC: 7.6 10*3/uL (ref 4.0–10.5)
nRBC: 0 % (ref 0.0–0.2)

## 2023-09-04 LAB — HEPARIN LEVEL (UNFRACTIONATED): Heparin Unfractionated: 0.97 [IU]/mL — ABNORMAL HIGH (ref 0.30–0.70)

## 2023-09-04 MED ORDER — DIPHENHYDRAMINE HCL 12.5 MG/5ML PO ELIX
12.5000 mg | ORAL_SOLUTION | Freq: Once | ORAL | Status: AC | PRN
  Administered 2023-09-04: 12.5 mg
  Filled 2023-09-04: qty 5

## 2023-09-04 MED ORDER — NORMAL SALINE FLUSH 0.9 % IV SOLN
10.0000 mL | Freq: Every day | INTRAVENOUS | 0 refills | Status: DC
  Filled 2023-09-04: qty 300, 30d supply, fill #0

## 2023-09-04 MED ORDER — DIPHENHYDRAMINE HCL 25 MG PO CAPS: 25.0000 mg | ORAL_CAPSULE | Freq: Once | ORAL | Status: DC | PRN

## 2023-09-04 NOTE — Progress Notes (Signed)
 Progress Note    ASSESSMENT AND PLAN:   Pancreatic adenocarcinoma s/p ucSEMS exchange 8/5 (10mm x 6 cm) Gastric outlet obstruction Acute cholecystitis s/p cholecystostomy tube 3/1 A Fib, CHB s/p pacemeker (eliquis on hold)   Plan: -Continue clear liquids.  Do not advance diet.  NPO past midnight. -Continue IV Protonix -EGD with possible enteral stenting by Dr. Meridee Score tomorrow  Discussed risks and benefits with the patient and patient's family.   SUBJECTIVE   Tolerating clear liquids NG tube clamped Status post cholecystostomy tube yesterday  Got signout from Dr. Marina Goodell yesterday OBJECTIVE:     Vital signs in last 24 hours: Temp:  [97.4 F (36.3 C)-98.3 F (36.8 C)] 97.8 F (36.6 C) (03/02 1311) Pulse Rate:  [59-86] 59 (03/02 1311) Resp:  [16-17] 16 (03/02 1311) BP: (112-133)/(60-71) 117/63 (03/02 1311) SpO2:  [92 %-98 %] 97 % (03/02 1311) Weight:  [92.7 kg] 92.7 kg (03/02 0500) Last BM Date : 09/03/23 General:   Alert, well-developed, in NAD. NG in place. EENT:  Normal hearing, non icteric sclera, conjunctive pink. Heart:  Regular rate and rhythm; no murmur.  No lower extremity edema   Pulm: Normal respiratory effort, lungs CTA bilaterally without wheezes or crackles. Abdomen:  Soft, nondistended, nontender.  Normal bowel sounds, cholecystostomy tube     Neurologic:  Alert and  oriented x4;  grossly normal neurologically. Psych:  Pleasant, cooperative.  Normal mood and affect.   Intake/Output from previous day: 03/01 0701 - 03/02 0700 In: 1778.7 [P.O.:480; I.V.:902.2; NG/GT:240; IV Piggyback:156.5] Out: 1220 [Urine:100; Emesis/NG output:200; Drains:920] Intake/Output this shift: Total I/O In: 137.3 [P.O.:120; IV Piggyback:17.3] Out: 450 [Urine:200; Drains:250]  Lab Results: Recent Labs    09/02/23 0215 09/03/23 0404 09/04/23 0101  WBC 3.1* 3.6* 7.6  HGB 9.0* 9.2* 9.1*  HCT 29.4* 30.2* 30.0*  PLT 138* 149* 145*   BMET Recent Labs     09/01/23 1447 09/01/23 1454 09/02/23 0215 09/03/23 0404  NA 134* 138 135 138  K 3.6 3.7 4.0 3.6  CL 100 101 105 107  CO2 26  --  24 22  GLUCOSE 111* 105* 142* 105*  BUN 24* 22 21 16   CREATININE 1.11 1.20 1.14 1.02  CALCIUM 8.7*  --  8.6* 8.8*   LFT Recent Labs    09/03/23 0404  PROT 6.0*  ALBUMIN 2.8*  AST 18  ALT 16  ALKPHOS 85  BILITOT 0.7   PT/INR Recent Labs    09/02/23 1747  LABPROT 17.1*  INR 1.4*   Hepatitis Panel No results for input(s): "HEPBSAG", "HCVAB", "HEPAIGM", "HEPBIGM" in the last 72 hours.  IR Perc Cholecystostomy Result Date: 09/03/2023 INDICATION: 83 year old male with history of pancreatic adenocarcinoma and common bile duct stent placement with clinical and imaging findings of acute cholecystitis. EXAM: Ultrasound and fluoroscopic guided percutaneous cholecystostomy tube placement. MEDICATIONS: The patient was currently receiving intravenous antibiotics as an inpatient, no additional antibiotics were administered. ANESTHESIA/SEDATION: Moderate (conscious) sedation was employed during this procedure. A total of Versed 2 mg and Fentanyl 100 mcg was administered intravenously. Moderate Sedation Time: 10 minutes. The patient's level of consciousness and vital signs were monitored continuously by radiology nursing throughout the procedure under my direct supervision. FLUOROSCOPY TIME:  Nineteen mGy COMPLICATIONS: None immediate. PROCEDURE: Informed written consent was obtained from the patient after a thorough discussion of the procedural risks, benefits and alternatives. All questions were addressed. Maximal Sterile Barrier Technique was utilized including caps, mask, sterile gowns, sterile gloves, sterile drape, hand hygiene  and skin antiseptic. A timeout was performed prior to the initiation of the procedure. The patient was placed supine on the angiographic table. The patient's right upper quadrant was then prepped and draped in normal sterile fashion with  maximum sterile barrier. Ultrasound demonstrates a distended gallbladder. Subdermal Local anesthesia was provided at the planned skin entry site. Under ultrasound guidance, deeper local anesthetic was provided through intercostal muscles and along the liver capsule. Ultrasound was used to puncture the gallbladder using a 21 gauge Chiba needle via a trans peritoneal approach with visualization of the lung treated to the gallbladder. A 0.018 inch wire was advanced into the lumen and a transition dilator placed. A gentle hand injection of contrast was performed. Cholecystogram demonstrates obstruction of the cystic duct. A 0.035 inch exchange wire was placed in the tract was dilated. A 10 French multipurpose drainage catheter was advanced into the gallbladder lumen. The drain was then secured in place using a 0-silk suture and a Stayfix device. A sterile dressing was applied. The tube was placed to bag drainage. A culture was sent to the lab for analysis. The patient tolerated procedure well without evidence of immediate complication was transferred back to the floor in stable condition. IMPRESSION: Successful placement of 10 French percutaneous, trans peritoneal cholecystostomy tube. Marliss Coots, MD [facility] Radiology Electronically Signed   By: Marliss Coots M.D.   On: 09/03/2023 09:25   DG INTRO LONG GI TUBE Result Date: 09/02/2023 CLINICAL DATA:  Gastric outlet obstruction. EXAM: FL FEEDING TUBE PLACEMENT CONTRAST:  0 cc FLUOROSCOPY: Fluoroscopy Time:  1 minute Radiation Exposure Index (if provided by the fluoroscopic device): 18 Number of Acquired Spot Images: 0 COMPARISON:  CT 03/31/2024 FINDINGS: NG tube advanced into the distal stomach. Tube passes multiple surgical clips within the stomach suggesting passage through gastrostomy. IMPRESSION: NG tube advanced into distal stomach. Electronically Signed   By: Genevive Bi M.D.   On: 09/02/2023 16:54    NM Hepatobiliary Liver Func Result Date: 09/02/2023 CLINICAL DATA:  Concern for cholecystitis. Gallbladder inflammation on CT EXAM: NUCLEAR MEDICINE HEPATOBILIARY IMAGING TECHNIQUE: Sequential images of the abdomen were obtained out to 60 minutes following intravenous administration of radiopharmaceutical. RADIOPHARMACEUTICALS:  5.2 mCi Tc-89m  Choletec IV COMPARISON:  CT 09/01/2023 FINDINGS: Prompt clearance radiotracer from blood pool and homogeneous uptake liver. Counts are evident in the small bowel by 20 minutes. Gallbladder fails to fill at 60 minutes. 3 mg IV morphine was administered to augment filling of the gallbladder. Gallbladder failed to fill following morphine augmentation. IMPRESSION: 1. Non filling the gallbladder is concerning for acute cholecystitis. Of note, patient has not eaten in days which can produce false positive findings. 2. Patent common bile duct. These results will be called to the ordering clinician or representative by the Radiologist Assistant, and communication documented in the PACS or [employer]. Electronically Signed   By: Genevive Bi M.D.   On: 09/02/2023 15:35     Principal Problem:   Acute cholecystitis Active Problems:   GERD (gastroesophageal reflux disease)   Cardiac pacemaker in situ   Atrial fibrillation, chronic (HCC)   Biliary obstruction   Pancreatic adenocarcinoma (HCC)   History of biliary duct stent placement   Gastric outlet obstruction   Malignant neoplasm of head of pancreas (HCC)   Abnormal CT of the abdomen     LOS: 3 days     Edman Circle, MD 09/04/2023, 2:03 PM  GI [phone]

## 2023-09-04 NOTE — Progress Notes (Signed)
 [facility] Progress Note     Subjective: CC:  No acute events overnight, NG clamped and tolerating clears   Objective: Vital signs in last 24 hours: Temp:  [97.4 F (36.3 C)-98.8 F (37.1 C)] 97.4 F (36.3 C) (03/02 0529) Pulse Rate:  [60-91] 61 (03/02 0529) Resp:  [10-17] 17 (03/02 0529) BP: (112-168)/(54-80) 121/67 (03/02 0529) SpO2:  [92 %-98 %] 98 % (03/02 0529) Weight:  [92.7 kg] 92.7 kg (03/02 0500) Last BM Date : 09/03/23  Intake/Output from previous day: 03/01 0701 - 03/02 0700 In: 1778.7 [P.O.:480; I.V.:902.2; NG/GT:240; IV Piggyback:156.5] Out: 1220 [Urine:100; Emesis/NG output:200; Drains:920] Intake/Output this shift: Total I/O In: 17.3 [IV Piggyback:17.3] Out: -   PE: Gen:  Alert, NAD, chronically ill appearing and pale Abd: soft, TTP RUQ without guarding, drain with bilious material Skin: warm and dry, no rashes  Psych: A&Ox3   Lab Results:  Recent Labs    09/03/23 0404 09/04/23 0101  WBC 3.6* 7.6  HGB 9.2* 9.1*  HCT 30.2* 30.0*  PLT 149* 145*   BMET Recent Labs    09/02/23 0215 09/03/23 0404  NA 135 138  K 4.0 3.6  CL 105 107  CO2 24 22  GLUCOSE 142* 105*  BUN 21 16  CREATININE 1.14 1.02  CALCIUM 8.6* 8.8*   PT/INR Recent Labs    09/02/23 1747  LABPROT 17.1*  INR 1.4*   CMP     Component Value Date/Time   NA 138 09/03/2023 0404   NA 144 01/18/2023 1209   K 3.6 09/03/2023 0404   CL 107 09/03/2023 0404   CO2 22 09/03/2023 0404   GLUCOSE 105 (H) 09/03/2023 0404   BUN 16 09/03/2023 0404   BUN 20 01/18/2023 1209   CREATININE 1.02 09/03/2023 0404   CREATININE 1.11 09/01/2023 1032   CREATININE 1.24 01/01/2011 1609   CALCIUM 8.8 (L) 09/03/2023 0404   PROT 6.0 (L) 09/03/2023 0404   ALBUMIN 2.8 (L) 09/03/2023 0404   AST 18 09/03/2023 0404   AST 19 09/01/2023 1032   ALT 16 09/03/2023 0404   ALT 15 09/01/2023 1032   ALKPHOS 85 09/03/2023 0404   BILITOT 0.7 09/03/2023 0404   BILITOT 0.4 09/01/2023 1032    GFRNONAA >60 09/03/2023 0404   GFRNONAA >60 09/01/2023 1032   GFRAA >60 02/07/2019 1446   Lipase     Component Value Date/Time   LIPASE 32 09/01/2023 1447       Studies/Results: IR Perc Cholecystostomy Result Date: 09/03/2023 INDICATION: 83 year old male with history of pancreatic adenocarcinoma and common bile duct stent placement with clinical and imaging findings of acute cholecystitis. EXAM: Ultrasound and fluoroscopic guided percutaneous cholecystostomy tube placement. MEDICATIONS: The patient was currently receiving intravenous antibiotics as an inpatient, no additional antibiotics were administered. ANESTHESIA/SEDATION: Moderate (conscious) sedation was employed during this procedure. A total of Versed 2 mg and Fentanyl 100 mcg was administered intravenously. Moderate Sedation Time: 10 minutes. The patient's level of consciousness and vital signs were monitored continuously by radiology nursing throughout the procedure under my direct supervision. FLUOROSCOPY TIME:  Nineteen mGy COMPLICATIONS: None immediate. PROCEDURE: Informed written consent was obtained from the patient after a thorough discussion of the procedural risks, benefits and alternatives. All questions were addressed. Maximal Sterile Barrier Technique was utilized including caps, mask, sterile gowns, sterile gloves, sterile drape, hand hygiene and skin antiseptic. A timeout was performed prior to the initiation of the procedure. The patient was placed supine on the angiographic table. The patient's right upper quadrant  was then prepped and draped in normal sterile fashion with maximum sterile barrier. Ultrasound demonstrates a distended gallbladder. Subdermal Local anesthesia was provided at the planned skin entry site. Under ultrasound guidance, deeper local anesthetic was provided through intercostal muscles and along the liver capsule. Ultrasound was used to puncture the gallbladder using a 21 gauge Chiba needle via a trans  peritoneal approach with visualization of the lung treated to the gallbladder. A 0.018 inch wire was advanced into the lumen and a transition dilator placed. A gentle hand injection of contrast was performed. Cholecystogram demonstrates obstruction of the cystic duct. A 0.035 inch exchange wire was placed in the tract was dilated. A 10 French multipurpose drainage catheter was advanced into the gallbladder lumen. The drain was then secured in place using a 0-silk suture and a Stayfix device. A sterile dressing was applied. The tube was placed to bag drainage. A culture was sent to the lab for analysis. The patient tolerated procedure well without evidence of immediate complication was transferred back to the floor in stable condition. IMPRESSION: Successful placement of 10 French percutaneous, trans peritoneal cholecystostomy tube. Marliss Coots, MD [facility] Radiology Electronically Signed   By: Marliss Coots M.D.   On: 09/03/2023 09:25   DG INTRO LONG GI TUBE Result Date: 09/02/2023 CLINICAL DATA:  Gastric outlet obstruction. EXAM: FL FEEDING TUBE PLACEMENT CONTRAST:  0 cc FLUOROSCOPY: Fluoroscopy Time:  1 minute Radiation Exposure Index (if provided by the fluoroscopic device): 18 Number of Acquired Spot Images: 0 COMPARISON:  CT 03/31/2024 FINDINGS: NG tube advanced into the distal stomach. Tube passes multiple surgical clips within the stomach suggesting passage through gastrostomy. IMPRESSION: NG tube advanced into distal stomach. Electronically Signed   By: Genevive Bi M.D.   On: 09/02/2023 16:54   NM Hepatobiliary Liver Func Result Date: 09/02/2023 CLINICAL DATA:  Concern for cholecystitis. Gallbladder inflammation on CT EXAM: NUCLEAR MEDICINE HEPATOBILIARY IMAGING TECHNIQUE: Sequential images of the abdomen were obtained out to 60 minutes following intravenous administration of radiopharmaceutical. RADIOPHARMACEUTICALS:  5.2 mCi Tc-66m  Choletec  IV COMPARISON:  CT 09/01/2023 FINDINGS: Prompt clearance radiotracer from blood pool and homogeneous uptake liver. Counts are evident in the small bowel by 20 minutes. Gallbladder fails to fill at 60 minutes. 3 mg IV morphine was administered to augment filling of the gallbladder. Gallbladder failed to fill following morphine augmentation. IMPRESSION: 1. Non filling the gallbladder is concerning for acute cholecystitis. Of note, patient has not eaten in days which can produce false positive findings. 2. Patent common bile duct. These results will be called to the ordering clinician or representative by the Radiologist Assistant, and communication documented in the PACS or [employer]. Electronically Signed   By: Genevive Bi M.D.   On: 09/02/2023 15:35    Anti-infectives: Anti-infectives (From admission, onward)    Start     Dose/Rate Route Frequency Ordered Stop   09/02/23 0400  piperacillin-tazobactam (ZOSYN) IVPB 3.375 g        3.375 g 12.5 mL/hr over 240 Minutes Intravenous Every 8 hours 09/01/23 2000     09/01/23 1900  piperacillin-tazobactam (ZOSYN) IVPB 3.375 g        3.375 g 100 mL/hr over 30 Minutes Intravenous  Once 09/01/23 1846 09/01/23 2006        Assessment/Plan Pancreatic adenocarcinoma, started as cT2N0 12/2022, s/p gem/abraxane 7-06/2023. Supposed to resume chemoradiation w/ Xeloda in march Gastric outlet obstruction Duodenitis Cholecystitis Portal vein occlusion  -afebrile, VSS, WBC WNL -  HIDA shows suspected cholecystitis. He is not surgical candidate for cholecystectomy due to his pancreatic cancer with signs of tumor progression. Percutaneous cholecystostomy tube placed 3/1 - regarding GOO, unable to place NG on the floor.  Completed in fluoro 2/28 - appreciate GI consult for endoscopy/consideration of stent, possible endoscopy by Dr. Meridee Score on Monday - diet per GI - May eventually need discussions about a venting gastrostomy tube and feeding access.     Biliary obstruction secondary to pancreatic cancer-status post ERCP/stent placement 12/13/2022 and stent exchange in august CAD Atrial fibrillation Gout Sleep apnea Status post bariatric surgery Hypertension GERD History of colon cancer in his 40s Complete heart block-pacemaker placed   LOS: 3 days   I reviewed nursing notes, hospitalist notes, last 24 h vitals and pain scores, last 48 h intake and output, last 24 h labs and trends, and last 24 h imaging results.  This care required moderate level of medical decision making.    Vanita Panda, MD  Colorectal and General Surgery [facility] Surgery

## 2023-09-04 NOTE — Progress Notes (Addendum)
 Patient states he has been getting benadryl 25 mg PO every night for sleep. There is currently not an active order for it and he is requesting it for sleep. Notified J. Garner Nash, NP. One-time order for benadryl ordered and given.

## 2023-09-04 NOTE — Plan of Care (Signed)

## 2023-09-04 NOTE — Progress Notes (Signed)
 PHARMACY - ANTICOAGULATION CONSULT NOTE  Pharmacy Consult for heparin Indication:  atrial fibrillation (apixaban on hold)  No Known Allergies  Patient Measurements: Weight: 92.7 kg (204 lb 5.9 oz) Heparin Dosing Weight: 89 kg  Vital Signs: Temp: 97.4 F (36.3 C) (03/02 0529) Temp Source: Oral (03/02 0529) BP: 121/67 (03/02 0529) Pulse Rate: 61 (03/02 0529)  Labs: Recent Labs    09/01/23 1447 09/01/23 1454 09/01/23 2120 09/02/23 0215 09/02/23 1130 09/02/23 1747 09/03/23 0404 09/03/23 1549 09/04/23 0101 09/04/23 1041  HGB  --  9.9*  --  9.0*  --   --  9.2*  --  9.1*  --   HCT  --  29.0*  --  29.4*  --   --  30.2*  --  30.0*  --   PLT  --   --   --  138*  --   --  149*  --  145*  --   APTT   < >  --  36 89*   < >  --  173* 43* 90* 94*  LABPROT  --   --   --   --   --  17.1*  --   --   --   --   INR  --   --   --   --   --  1.4*  --   --   --   --   HEPARINUNFRC  --   --  >1.10*  --   --   --  >1.10*  --  0.97*  --   CREATININE  --  1.20  --  1.14  --   --  1.02  --   --   --    < > = values in this interval not displayed.    Estimated Creatinine Clearance: 60.6 mL/min (by C-G formula based on SCr of 1.02 mg/dL).   Medical History: Past Medical History:  Diagnosis Date   Acute meniscal tear of knee left   Arthritis    generalized   AV block 08/01/2018   Benign essential hypertension 01/18/2007   Bronchiectasis with acute exacerbation 06/23/2020   Chest pain, atypical 08/23/2014   Chronic cough 10/07/2014   Coronary artery disease    Degeneration of lumbar intervertebral disc 10/14/2017   Diverticulosis of colon 07/07/2005   Elevated PSA 06/19/2014   GERD (gastroesophageal reflux disease) 01/18/2007   Gout, unspecified 01/18/2007   Hemorrhoids 01/19/2011   Hiatal hernia    History of colonic polyps 01/18/2007   Insomnia, unspecified 08/21/2007   Iron deficiency anemia, unspecified  01/19/2011   Lumbar post-laminectomy syndrome 10/14/2017   Lumbar  spondylolysis    Lumbar strain 12/14/2011   Obstructive sleep apnea 01/18/2007   uses CPAP nightly   Paraesophageal hernia    large   Primary osteoarthritis of knee 05/23/2015   Prostate cancer (HCC) 2016   S/P knee replacement 05/23/2015   Sensorineural hearing loss, bilateral    Vitamin B12 deficiency    Vitamin D deficiency 10/28/2009    Medications: Apixaban 5 mg PO BID PTA -Last dose: 2/26 PM  Assessment: Pt is an 42 yoM with PMH significant for pancreatic cancer s/p ERCP for obstructive jaundice. Pt chronically anticoagulated with apixaban for atrial fibrillation. Anticoagulation being transitioned to heparin - pharmacy to dose.   Today, 09/04/23 aPTT now therapeutic x 2 on IV heparin 1200 units/hr.  Verified heparin infusing peripherally & labs drawn via port Heparin level 0.97- elevated but trending down as anticipated due  to interaction with apixaban CBC: Hgb 9.1, Plt 145 - low/stable RN reports no bleeding or infusion related concerns . Renal function stable  Goal of Therapy:  Heparin level 0.3-0.7 units/ml aPTT 66-102 seconds Monitor platelets by anticoagulation protocol: Yes   Plan:  Continue IV heparin 1200 units/hr CBC, heparin level/aPTT daily. Once heparin level and aPTT correlate, can monitor using heparin level only Monitor for signs of bleeding   If heparin needs to be held for any procedures, please provide pharmacy with instructions for when to hold.   Hessie Knows, PharmD, BCPS Secure Chat if ?s 09/04/2023 12:51 PM

## 2023-09-04 NOTE — H&P (View-Only) (Signed)
 Progress Note    ASSESSMENT AND PLAN:   Pancreatic adenocarcinoma s/p ucSEMS exchange 8/5 (10mm x 6 cm) Gastric outlet obstruction Acute cholecystitis s/p cholecystostomy tube 3/1 A Fib, CHB s/p pacemeker (eliquis on hold)   Plan: -Continue clear liquids.  Do not advance diet.  NPO past midnight. -Continue IV Protonix -EGD with possible enteral stenting by Dr. Meridee Score tomorrow  Discussed risks and benefits with the patient and patient's family.   SUBJECTIVE   Tolerating clear liquids NG tube clamped Status post cholecystostomy tube yesterday  Got signout from Dr. Marina Goodell yesterday OBJECTIVE:     Vital signs in last 24 hours: Temp:  [97.4 F (36.3 C)-98.3 F (36.8 C)] 97.8 F (36.6 C) (03/02 1311) Pulse Rate:  [59-86] 59 (03/02 1311) Resp:  [16-17] 16 (03/02 1311) BP: (112-133)/(60-71) 117/63 (03/02 1311) SpO2:  [92 %-98 %] 97 % (03/02 1311) Weight:  [92.7 kg] 92.7 kg (03/02 0500) Last BM Date : 09/03/23 General:   Alert, well-developed, in NAD. NG in place. EENT:  Normal hearing, non icteric sclera, conjunctive pink. Heart:  Regular rate and rhythm; no murmur.  No lower extremity edema   Pulm: Normal respiratory effort, lungs CTA bilaterally without wheezes or crackles. Abdomen:  Soft, nondistended, nontender.  Normal bowel sounds, cholecystostomy tube     Neurologic:  Alert and  oriented x4;  grossly normal neurologically. Psych:  Pleasant, cooperative.  Normal mood and affect.   Intake/Output from previous day: 03/01 0701 - 03/02 0700 In: 1778.7 [P.O.:480; I.V.:902.2; NG/GT:240; IV Piggyback:156.5] Out: 1220 [Urine:100; Emesis/NG output:200; Drains:920] Intake/Output this shift: Total I/O In: 137.3 [P.O.:120; IV Piggyback:17.3] Out: 450 [Urine:200; Drains:250]  Lab Results: Recent Labs    09/02/23 0215 09/03/23 0404 09/04/23 0101  WBC 3.1* 3.6* 7.6  HGB 9.0* 9.2* 9.1*  HCT 29.4* 30.2* 30.0*  PLT 138* 149* 145*   BMET Recent Labs     09/01/23 1447 09/01/23 1454 09/02/23 0215 09/03/23 0404  NA 134* 138 135 138  K 3.6 3.7 4.0 3.6  CL 100 101 105 107  CO2 26  --  24 22  GLUCOSE 111* 105* 142* 105*  BUN 24* 22 21 16   CREATININE 1.11 1.20 1.14 1.02  CALCIUM 8.7*  --  8.6* 8.8*   LFT Recent Labs    09/03/23 0404  PROT 6.0*  ALBUMIN 2.8*  AST 18  ALT 16  ALKPHOS 85  BILITOT 0.7   PT/INR Recent Labs    09/02/23 1747  LABPROT 17.1*  INR 1.4*   Hepatitis Panel No results for input(s): "HEPBSAG", "HCVAB", "HEPAIGM", "HEPBIGM" in the last 72 hours.  IR Perc Cholecystostomy Result Date: 09/03/2023 INDICATION: 83 year old male with history of pancreatic adenocarcinoma and common bile duct stent placement with clinical and imaging findings of acute cholecystitis. EXAM: Ultrasound and fluoroscopic guided percutaneous cholecystostomy tube placement. MEDICATIONS: The patient was currently receiving intravenous antibiotics as an inpatient, no additional antibiotics were administered. ANESTHESIA/SEDATION: Moderate (conscious) sedation was employed during this procedure. A total of Versed 2 mg and Fentanyl 100 mcg was administered intravenously. Moderate Sedation Time: 10 minutes. The patient's level of consciousness and vital signs were monitored continuously by radiology nursing throughout the procedure under my direct supervision. FLUOROSCOPY TIME:  Nineteen mGy COMPLICATIONS: None immediate. PROCEDURE: Informed written consent was obtained from the patient after a thorough discussion of the procedural risks, benefits and alternatives. All questions were addressed. Maximal Sterile Barrier Technique was utilized including caps, mask, sterile gowns, sterile gloves, sterile drape, hand hygiene  and skin antiseptic. A timeout was performed prior to the initiation of the procedure. The patient was placed supine on the angiographic table. The patient's right upper quadrant was then prepped and draped in normal sterile fashion with  maximum sterile barrier. Ultrasound demonstrates a distended gallbladder. Subdermal Local anesthesia was provided at the planned skin entry site. Under ultrasound guidance, deeper local anesthetic was provided through intercostal muscles and along the liver capsule. Ultrasound was used to puncture the gallbladder using a 21 gauge Chiba needle via a trans peritoneal approach with visualization of the lung treated to the gallbladder. A 0.018 inch wire was advanced into the lumen and a transition dilator placed. A gentle hand injection of contrast was performed. Cholecystogram demonstrates obstruction of the cystic duct. A 0.035 inch exchange wire was placed in the tract was dilated. A 10 French multipurpose drainage catheter was advanced into the gallbladder lumen. The drain was then secured in place using a 0-silk suture and a Stayfix device. A sterile dressing was applied. The tube was placed to bag drainage. A culture was sent to the lab for analysis. The patient tolerated procedure well without evidence of immediate complication was transferred back to the floor in stable condition. IMPRESSION: Successful placement of 10 French percutaneous, trans peritoneal cholecystostomy tube. Marliss Coots, MD [facility] Radiology Electronically Signed   By: Marliss Coots M.D.   On: 09/03/2023 09:25   DG INTRO LONG GI TUBE Result Date: 09/02/2023 CLINICAL DATA:  Gastric outlet obstruction. EXAM: FL FEEDING TUBE PLACEMENT CONTRAST:  0 cc FLUOROSCOPY: Fluoroscopy Time:  1 minute Radiation Exposure Index (if provided by the fluoroscopic device): 18 Number of Acquired Spot Images: 0 COMPARISON:  CT 03/31/2024 FINDINGS: NG tube advanced into the distal stomach. Tube passes multiple surgical clips within the stomach suggesting passage through gastrostomy. IMPRESSION: NG tube advanced into distal stomach. Electronically Signed   By: Genevive Bi M.D.   On: 09/02/2023 16:54    NM Hepatobiliary Liver Func Result Date: 09/02/2023 CLINICAL DATA:  Concern for cholecystitis. Gallbladder inflammation on CT EXAM: NUCLEAR MEDICINE HEPATOBILIARY IMAGING TECHNIQUE: Sequential images of the abdomen were obtained out to 60 minutes following intravenous administration of radiopharmaceutical. RADIOPHARMACEUTICALS:  5.2 mCi Tc-89m  Choletec IV COMPARISON:  CT 09/01/2023 FINDINGS: Prompt clearance radiotracer from blood pool and homogeneous uptake liver. Counts are evident in the small bowel by 20 minutes. Gallbladder fails to fill at 60 minutes. 3 mg IV morphine was administered to augment filling of the gallbladder. Gallbladder failed to fill following morphine augmentation. IMPRESSION: 1. Non filling the gallbladder is concerning for acute cholecystitis. Of note, patient has not eaten in days which can produce false positive findings. 2. Patent common bile duct. These results will be called to the ordering clinician or representative by the Radiologist Assistant, and communication documented in the PACS or [employer]. Electronically Signed   By: Genevive Bi M.D.   On: 09/02/2023 15:35     Principal Problem:   Acute cholecystitis Active Problems:   GERD (gastroesophageal reflux disease)   Cardiac pacemaker in situ   Atrial fibrillation, chronic (HCC)   Biliary obstruction   Pancreatic adenocarcinoma (HCC)   History of biliary duct stent placement   Gastric outlet obstruction   Malignant neoplasm of head of pancreas (HCC)   Abnormal CT of the abdomen     LOS: 3 days     Edman Circle, MD 09/04/2023, 2:03 PM  GI [phone]

## 2023-09-04 NOTE — Progress Notes (Signed)
 Patient still has NG in place and stated that he is supposed to have a procedure tomorrow.  Patient not able to use CPAP at this time.

## 2023-09-04 NOTE — Progress Notes (Signed)
 Referring Physician(s): Hosie Spangle PA  Supervising Physician: Marliss Coots  Patient Status:  [name] - In-pt  Chief Complaint:  Acute cholecystitis s/p cholecystostomy placed on 3.1.25 by IR Attending Dr. Marliss Coots   Subjective:  Patient sitting up eating. Daughter at bedside. Denies and abdominal pain, nausea or vomiting. Drain education provided and all questions answered  Allergies: Patient has no known allergies.  Medications: Prior to Admission medications   Medication Sig Start Date End Date Taking? Authorizing Provider  acetaminophen (TYLENOL) 325 MG tablet Take 2 tablets (650 mg total) by mouth every 6 (six) hours as needed for mild pain (or Fever >/= 101). 01/13/23  Yes Sheikh, Omair Latif, DO  allopurinol (ZYLOPRIM) 300 MG tablet Take 300 mg by mouth daily.   Yes [provider]  amLODipine (NORVASC) 10 MG tablet Take 10 mg by mouth daily.   Yes [provider]  apixaban (ELIQUIS) 5 MG TABS tablet Take 1 tablet (5 mg total) by mouth 2 (two) times daily. 02/23/23 09/01/23 Yes Nafziger, Kandee Keen, NP  bisacodyl (DULCOLAX) 5 MG EC tablet Take 10 mg by mouth daily as needed for moderate constipation.   Yes [provider]  capecitabine (XELODA) 500 MG tablet Take 3 tablets (1,500 mg total) by mouth 2 (two) times daily after a meal. Take Monday-Friday. Take only days of radiation. 08/25/23  Yes [name], MD  Carboxymethylcellulose Sod PF 0.5 % SOLN Apply 1 drop to eye in the morning, at noon, in the evening, and at bedtime.   Yes [provider]  cyanocobalamin (VITAMIN B12) 500 MCG tablet Take 1 tablet by mouth 2 (two) times daily. 07/26/23  Yes [provider]  fluticasone (FLONASE) 50 MCG/ACT nasal spray Place 2 sprays into both nostrils daily.   Yes [provider]  glycerin adult 2 g suppository Place 1 suppository rectally as needed for constipation. 08/14/23  Yes Ernie Avena, MD  ipratropium (ATROVENT) 0.03 %  nasal spray Place 2 sprays into both nostrils every 12 (twelve) hours. Patient taking differently: Place 2 sprays into both nostrils 2 (two) times daily as needed for rhinitis. 05/03/22  Yes   lidocaine-prilocaine (EMLA) cream Apply 1 Application topically as needed. 12/31/22  Yes [name], MD  mirabegron ER (MYRBETRIQ) 50 MG TB24 tablet Take 1 tablet by mouth daily. 06/21/23  Yes [provider]  oxybutynin (DITROPAN) 5 MG tablet Take 5 mg by mouth 2 (two) times daily as needed (urinary incontinence).   Yes [provider]  polyethylene glycol powder (GLYCOLAX/MIRALAX) 17 GM/SCOOP powder Mix 4 capfuls in a 32 oz Gatorade, Can repeat the next day for a satisfactory bowel movement. THEN one capful daily for maintenance 08/14/23  Yes Ernie Avena, MD  Potassium Gluconate 550 MG TABS Take 550 mg by mouth daily.   Yes [provider]  rosuvastatin (CRESTOR) 20 MG tablet Take 20 mg by mouth daily.   Yes [provider]  sodium chloride HYPERTONIC 3 % nebulizer solution Take by nebulization as needed for other. 04/01/22  Yes [name], NP  tamsulosin (FLOMAX) 0.4 MG CAPS capsule Take 1 capsule by mouth daily. 06/21/23  Yes [provider]  traZODone (DESYREL) 50 MG tablet Take 1 tablet by mouth at bedtime as needed for sleep. 07/26/23  Yes [provider]  Calcium Carb-Cholecalciferol (CALCIUM 600 + D PO) Take 1 tablet by mouth daily. 800 units of vitamin D Patient not taking: Reported on 09/01/2023    [provider]  docusate sodium (COLACE) 100 MG capsule Take 100 mg by mouth 2 (two) times daily. Patient not taking: Reported on 09/01/2023    [provider]  Eszopiclone 3 MG TABS Take 1 tablet (3 mg total) by mouth at bedtime. Take immediately before bedtime Patient not taking: Reported on 09/01/2023 06/14/23   [name], NP  mirtazapine (REMERON) 15 MG tablet Take 1 tablet (15 mg total) by mouth at  bedtime. Patient not taking: Reported on 09/01/2023 06/10/23   [name], MD  ondansetron (ZOFRAN) 4 MG tablet Take 1 tablet (4 mg total) by mouth every 4 (four) hours as needed for nausea or vomiting. Patient not taking: Reported on 09/01/2023 08/10/23   [name], NP  oxyCODONE (ROXICODONE) 5 MG immediate release tablet Take 1 tablet (5 mg total) by mouth every 6 (six) hours as needed for severe pain (pain score 7-10). Patient not taking: Reported on 09/01/2023 08/18/23   [name], NP  Respiratory Therapy Supplies (FLUTTER) DEVI 1 each by Does not apply route in the morning and at bedtime. 10/11/19   [name], DO  valsartan-hydrochlorothiazide (DIOVAN-HCT) 160-25 MG tablet Take 1 tablet by mouth daily. Patient not taking: Reported on 09/01/2023    [provider]     Vital Signs: BP 121/67 (BP Location: Right Arm)   Pulse 61   Temp (!) 97.4 F (36.3 C) (Oral)   Resp 17   Wt 204 lb 5.9 oz (92.7 kg)   SpO2 98%   BMI 31.07 kg/m   Physical Exam Vitals and nursing note reviewed.  Constitutional:      Appearance: He is well-developed.  HENT:     Head: Normocephalic.  Pulmonary:     Effort: Pulmonary effort is normal.  Abdominal:     Comments: Positive RUQ  drain to  gravity bag. Site is unremarkable with no erythema, edema, tenderness, bleeding or drainage noted at exit site. Suture and stat lock in place. Dressing is clean dry and intact. 100 ml of  yellow colored fluid noted in gravity bag.   Musculoskeletal:        General: Normal range of motion.     Cervical back: Normal range of motion.  Skin:    General: Skin is warm and dry.  Neurological:     General: No focal deficit present.     Mental Status: He is alert and oriented to person, place, and time.     Imaging: IR Perc Cholecystostomy Result Date: 09/03/2023 INDICATION: 83 year old male with history of pancreatic adenocarcinoma and common bile duct stent placement with clinical and imaging  findings of acute cholecystitis. EXAM: Ultrasound and fluoroscopic guided percutaneous cholecystostomy tube placement. MEDICATIONS: The patient was currently receiving intravenous antibiotics as an inpatient, no additional antibiotics were administered. ANESTHESIA/SEDATION: Moderate (conscious) sedation was employed during this procedure. A total of Versed 2 mg and Fentanyl 100 mcg was administered intravenously. Moderate Sedation Time: 10 minutes. The patient's level of consciousness and vital signs were monitored continuously by radiology nursing throughout the procedure under my direct supervision. FLUOROSCOPY TIME:  Nineteen mGy COMPLICATIONS: None immediate. PROCEDURE: Informed written consent was obtained from the patient after a thorough discussion of the procedural risks, benefits and alternatives. All questions were addressed. Maximal Sterile Barrier Technique was utilized including caps, mask, sterile gowns, sterile gloves, sterile drape, hand hygiene and skin antiseptic. A timeout was performed prior to the initiation of the procedure. The patient was placed supine on the angiographic table. The patient's right upper quadrant  was then prepped and draped in normal sterile fashion with maximum sterile barrier. Ultrasound demonstrates a distended gallbladder. Subdermal Local anesthesia was provided at the planned skin entry site. Under ultrasound guidance, deeper local anesthetic was provided through intercostal muscles and along the liver capsule. Ultrasound was used to puncture the gallbladder using a 21 gauge Chiba needle via a trans peritoneal approach with visualization of the lung treated to the gallbladder. A 0.018 inch wire was advanced into the lumen and a transition dilator placed. A gentle hand injection of contrast was performed. Cholecystogram demonstrates obstruction of the cystic duct. A 0.035 inch exchange wire was placed in the tract was dilated. A 10 French multipurpose drainage catheter  was advanced into the gallbladder lumen. The drain was then secured in place using a 0-silk suture and a Stayfix device. A sterile dressing was applied. The tube was placed to bag drainage. A culture was sent to the lab for analysis. The patient tolerated procedure well without evidence of immediate complication was transferred back to the floor in stable condition. IMPRESSION: Successful placement of 10 French percutaneous, trans peritoneal cholecystostomy tube. Marliss Coots, MD [facility] Radiology Electronically Signed   By: Marliss Coots M.D.   On: 09/03/2023 09:25   DG INTRO LONG GI TUBE Result Date: 09/02/2023 CLINICAL DATA:  Gastric outlet obstruction. EXAM: FL FEEDING TUBE PLACEMENT CONTRAST:  0 cc FLUOROSCOPY: Fluoroscopy Time:  1 minute Radiation Exposure Index (if provided by the fluoroscopic device): 18 Number of Acquired Spot Images: 0 COMPARISON:  CT 03/31/2024 FINDINGS: NG tube advanced into the distal stomach. Tube passes multiple surgical clips within the stomach suggesting passage through gastrostomy. IMPRESSION: NG tube advanced into distal stomach. Electronically Signed   By: Genevive Bi M.D.   On: 09/02/2023 16:54   NM Hepatobiliary Liver Func Result Date: 09/02/2023 CLINICAL DATA:  Concern for cholecystitis. Gallbladder inflammation on CT EXAM: NUCLEAR MEDICINE HEPATOBILIARY IMAGING TECHNIQUE: Sequential images of the abdomen were obtained out to 60 minutes following intravenous administration of radiopharmaceutical. RADIOPHARMACEUTICALS:  5.2 mCi Tc-24m  Choletec IV COMPARISON:  CT 09/01/2023 FINDINGS: Prompt clearance radiotracer from blood pool and homogeneous uptake liver. Counts are evident in the small bowel by 20 minutes. Gallbladder fails to fill at 60 minutes. 3 mg IV morphine was administered to augment filling of the gallbladder. Gallbladder failed to fill following morphine augmentation. IMPRESSION: 1. Non filling the  gallbladder is concerning for acute cholecystitis. Of note, patient has not eaten in days which can produce false positive findings. 2. Patent common bile duct. These results will be called to the ordering clinician or representative by the Radiologist Assistant, and communication documented in the PACS or [employer]. Electronically Signed   By: Genevive Bi M.D.   On: 09/02/2023 15:35   CT ABDOMEN PELVIS W CONTRAST Result Date: 09/01/2023 CLINICAL DATA:  Evaluate for bowel obstruction. EXAM: CT ABDOMEN AND PELVIS WITH CONTRAST TECHNIQUE: Multidetector CT imaging of the abdomen and pelvis was performed using the standard protocol following bolus administration of intravenous contrast. RADIATION DOSE REDUCTION: This exam was performed according to the departmental dose-optimization program which includes automated exposure control, adjustment of the mA and/or kV according to patient size and/or use of iterative reconstruction technique. CONTRAST:  OMNIPAQUE IOHEXOL 300 MG/ML  SOLN COMPARISON:  CT abdomen and pelvis 08/14/2023. FINDINGS: Lower chest: No acute abnormality. Hepatobiliary: Common bile duct stent is unchanged in position. Pneumobilia is unchanged. There are few hypodense lesions in the liver measuring up  to 16 mm which are unchanged from prior. Gallstones are present. There is inflammatory stranding surrounding the gallbladder. Pancreas: Ill-defined hypodense pancreatic head mass appears grossly unchanged. The body and tail of the pancreas are atrophic with ductal dilatation similar to prior. There is no new acute inflammation or fluid collection. Spleen: Normal in size without focal abnormality. Adrenals/Urinary Tract: 18 mm left renal cyst is again noted. There are punctate calcifications in the right kidney. There is no hydronephrosis. Adrenal glands and bladder are within normal limits. Stomach/Bowel: Sigmoid colon anastomosis is again noted. There are scattered colonic diverticula.  The appendix and small bowel are within normal limits. Partial gastrectomy changes are present. The stomach is dilated with air-fluid level. There some wall thickening of the duodenal with mild surrounding inflammation. Vascular/Lymphatic: Aorta and IVC are normal in size. There is narrowing or occlusion of the portal vein near the portal confluence, unchanged. No enlarged lymph nodes are identified. Reproductive: Prostate radiotherapy seeds are present. Other: There surgical clips in the anterior abdominal wall. There is mild inflammatory stranding in the subcutaneous tissues of the anterior abdominal wall. There are small fat containing bilateral inguinal hernias. Musculoskeletal: Multilevel degenerative changes affect the spine. IMPRESSION: 1. Cholelithiasis with inflammatory stranding surrounding the gallbladder. Findings are worrisome for acute cholecystitis. 2. The stomach is dilated which may represent gastritis or obstruction. There is inflammatory stranding and wall thickening involving the duodenal which may represent duodenitis. Tumor involvement not excluded. 3. Common bile duct stent is unchanged in position. 4. Stable hypodense lesions in the liver. 5. Stable pancreatic head mass. 6. Stable narrowing or occlusion of the portal vein near the portal confluence. 7. Nonobstructing right renal stones. 8. Colonic diverticulosis. 9. Mild inflammatory stranding in the subcutaneous tissues of the anterior abdominal wall. Correlate clinically for cellulitis. Electronically Signed   By: Darliss Cheney M.D.   On: 09/01/2023 17:46   DG Chest Portable 1 View Result Date: 09/01/2023 CLINICAL DATA:  Weakness. EXAM: PORTABLE CHEST 1 VIEW COMPARISON:  February 27, 2024. FINDINGS: Stable cardiomediastinal silhouette. Left-sided pacemaker is unchanged. Right internal jugular Port-A-Cath is unchanged. Hypoinflation of the lungs is noted with probable minimal bibasilar subsegmental atelectasis. Bony thorax is unremarkable.  IMPRESSION: Hypoinflation of the lungs with probable minimal bibasilar subsegmental atelectasis. Electronically Signed   By: Lupita Raider M.D.   On: 09/01/2023 17:05    Labs:  CBC: Recent Labs    09/01/23 1447 09/01/23 1454 09/02/23 0215 09/03/23 0404 09/04/23 0101  WBC 3.6*  --  3.1* 3.6* 7.6  HGB 9.3* 9.9* 9.0* 9.2* 9.1*  HCT 30.2* 29.0* 29.4* 30.2* 30.0*  PLT 150  --  138* 149* 145*    COAGS: Recent Labs    12/13/22 1727 12/14/22 0037 01/09/23 1635 09/01/23 2120 09/02/23 1130 09/02/23 1747 09/03/23 0404 09/03/23 1549 09/04/23 0101  INR 1.6* 1.4* 1.3*  --   --  1.4*  --   --   --   APTT  --   --   --    < > 97*  --  173* 43* 90*   < > = values in this interval not displayed.    BMP: Recent Labs    09/01/23 1032 09/01/23 1447 09/01/23 1454 09/02/23 0215 09/03/23 0404  NA 137 134* 138 135 138  K 3.8 3.6 3.7 4.0 3.6  CL 100 100 101 105 107  CO2 29 26  --  24 22  GLUCOSE 125* 111* 105* 142* 105*  BUN 25* 24* 22 21 16  CALCIUM 9.0 8.7*  --  8.6* 8.8*  CREATININE 1.11 1.11 1.20 1.14 1.02  GFRNONAA >60 >60  --  >60 >60    LIVER FUNCTION TESTS: Recent Labs    09/01/23 1032 09/01/23 1447 09/02/23 0215 09/03/23 0404  BILITOT 0.4 0.4 0.6 0.7  AST 19 22 20 18   ALT 15 19 17 16   ALKPHOS 96 94 92 85  PROT 6.6 6.3* 5.9* 6.0*  ALBUMIN 3.6 3.1* 2.8* 2.8*    Assessment and Plan:  83 y.o. male inpatient. History of GERD, prostate cancer, pancreatic adenocarcinoma, CAD, AV block. Patient presented the ED at [facility] on 2.27.24 with abdominal pain, abdominal distention nausea and vomiting. CT Abd pelvis fro 2.27.25 reads Cholelithiasis with inflammatory stranding surrounding the gallbladder. Findings are worrisome for acute cholecystitis. HIDA scan from 2.28.25 reads on filling the gallbladder is concerning for acute cholecystitis. Of note, patient has not eaten in days which can produce false positive findings. 2. Patent common bile duct. IR placed a cholecystomy  tube on 3.1.25.   Drain Location: RUQ Size: Fr size: 10 Fr Date of placement: 3.2.25  Currently to: Drain collection device: gravity 24 hour output:  Output by Drain (mL) 09/02/23 0700 - 09/02/23 1459 09/02/23 1500 - 09/02/23 2259 09/02/23 2300 - 09/03/23 0659 09/03/23 0700 - 09/03/23 1459 09/03/23 1500 - 09/03/23 2259 09/03/23 2300 - 09/04/23 0659 09/04/23 0700 - 09/04/23 1056  Biliary Tube Cholecystostomy 10 Fr. RUQ    540 175 205 250  100 ml of yellow colored fluid noted to be in gravity back. No new labs at this time  Interval imaging/drain manipulation:  None since drain placement   Current examination: Flushes/aspirates easily.  Insertion site unremarkable. Suture and stat lock in place. Dressed appropriately.   Plan: Continue TID flushes with 5 cc NS. Record output Q shift. Dressing changes QD or PRN if soiled.  Call IR APP or on call IR MD if difficulty flushing or sudden change in drain output.   Discharge planning: Please contact IR APP or on call IR MD prior to patient d/c to ensure appropriate follow up plans are in place. Typically patient will follow up 6-8 week for drain exchange. Cholecystomy tube drain will need to remain in place at least 6 -8  weeks unless gallbladder surgically removed in interim; Once daily with 5 ml sterile normal saline as outpatient; will schedule f/u cholangiogram/ drain/ exchange in 6-8 weeks. IR scheduler will contact patient with date/time of appointment. Patient will need to flush drain QD with 5 cc NS, record output QD, dressing changes every 2-3 days or earlier if soiled.   IR will continue to follow - please call with questions or concerns.    Electronically Signed: Alene Mires, NP 09/04/2023, 10:56 AM   I spent a total of 15 Minutes at the patient's bedside AND on the patient's hospital floor or unit, greater than 50% of which was counseling/coordinating care for cholecystomy

## 2023-09-04 NOTE — TOC Initial Note (Signed)
 Transition of Care [facility]) - Initial/Assessment Note    Patient Details  Name: Marcus [name] MRN: [id number] Date of Birth: 1940-12-22  Transition of Care [facility] CM/SW Contact:    Marcus Prows, RN Phone Number: 09/04/2023, 4:11 PM  Clinical Narrative:                 PT recc HHPT; per Marcus Fusi, RN pt is resting and no family is at bedside; called his son Marcus [name]. Marcus [name] [phone]); he says pt lives at home w/ him; he plans for pt to return at d/c; Marcus [name] Insurance/PCP; family will provide transportation; Marcus [name] denies pt experiencing SDOH risks; pt has cane, and walker; he does not have HH services or home oxygen; Marcus [name] says he will speak w/ his father regarding HHPT; will pass on to oncoming TOC for follow up.  Expected Discharge Plan: Home/Self Care Barriers to Discharge: Continued Medical Work up   Patient Goals and CMS Choice Patient states their goals for this hospitalization and ongoing recovery are:: home CMS Medicare.gov Compare Post Acute Care list provided to:: Patient Represenative (must comment) Marcus [name] (son))        Expected Discharge Plan and Services   Discharge Planning Services: CM Consult   Living arrangements for the past 2 months: Single Family Home                                      Prior Living Arrangements/Services Living arrangements for the past 2 months: Single Family Home Lives with:: Adult Children Patient language and need for interpreter reviewed:: Yes Do you feel safe going back to the place where you live?: Yes      Need for Family Participation in Patient Care: Yes (Comment) Care giver support system in place?: Yes (comment) Current home services: DME (cane, walker) Criminal Activity/Legal Involvement Pertinent to Current Situation/Hospitalization: No - Comment as needed  Activities of Daily Living      Permission Sought/Granted Permission sought to share information with : Case  Manager Permission granted to share information with : Yes, Verbal Permission Granted  Share Information with NAME: Case Manager     Permission granted to share info w Relationship: Marcus [name] (son) [phone]     Emotional Assessment Appearance:: Other (Comment Required (unable to assess) Attitude/Demeanor/Rapport: Unable to Assess Affect (typically observed): Unable to Assess Orientation: :  (unable to assess) Alcohol / Substance Use: Not Applicable Psych Involvement: No (comment)  Admission diagnosis:  Acute cholecystitis [K81.0] Patient Active Problem List   Diagnosis Date Noted   Gastric outlet obstruction 09/02/2023   Malignant neoplasm of head of pancreas (HCC) 09/02/2023   Abnormal CT of the abdomen 09/02/2023   Anemia 02/11/2023   Genetic testing 01/14/2023   AKI (acute kidney injury) (HCC) 01/10/2023   Sepsis secondary from pneumonia and acute cholecystitis  (HCC) 01/09/2023   Pneumonia of left lower lobe due to infectious organism 01/09/2023   CHF exacerbation (HCC) 01/09/2023   Combined systolic and diastolic heart failure (HCC) 01/09/2023   COPD with acute exacerbation (HCC) 01/09/2023   Acute cholecystitis 01/09/2023   Hypokalemia 01/09/2023   History of biliary duct stent placement 01/09/2023   Pruritus 01/09/2023   Pancreatic adenocarcinoma (HCC) 12/29/2022   Malnutrition of moderate degree 12/15/2022   Biliary obstruction 12/10/2022   Pancreatic mass 12/10/2022   Status post bariatric surgery 12/10/2022   Elevated LFTs 12/10/2022  Atrial fibrillation, chronic (HCC) 10/11/2022   Long term (current) use of anticoagulants 10/11/2022   PND (post-nasal drip) 05/03/2022   Bronchiectasis without complication (HCC) 04/03/2022   Vitamin B12 deficiency 12/25/2020   Sensorineural hearing loss, bilateral    Cardiac pacemaker in situ 03/26/2020   AV block 08/01/2018   Degeneration of lumbar intervertebral disc 10/14/2017   Lumbar post-laminectomy syndrome  10/14/2017   Primary osteoarthritis of knee 05/23/2015   S/P knee replacement 05/23/2015   Hemorrhoids 01/19/2011   Vitamin D deficiency 10/28/2009   Obesity 10/17/2007   Gout 01/18/2007   Obstructive sleep apnea 01/18/2007   Benign essential hypertension 01/18/2007   GERD (gastroesophageal reflux disease) 01/18/2007   History of malignant neoplasm of colon 01/18/2007   History of colonic polyps 01/18/2007   Hiatal hernia 10/26/2006   Diverticulosis of colon 07/07/2005   PCP:  [name], NP Pharmacy:   [facility] [address] [place] [address] Phone: [phone] Fax: [phone]  [facility] - [place], [place] - [address] [place] [address] Phone: [phone] Fax: [phone]     Social Drivers of Health (SDOH) Social History: SDOH Screenings   Food Insecurity: No Food Insecurity (09/04/2023)  Housing: Low Risk  (09/04/2023)  Transportation Needs: No Transportation Needs (09/04/2023)  Utilities: Not At Risk (09/04/2023)  Alcohol Screen: Low Risk  (08/22/2023)  Depression (PHQ2-9): High Risk (09/01/2023)  Financial Resource Strain: Low Risk  (07/28/2021)  Physical Activity: Inactive (07/28/2021)  Social Connections: Moderately Isolated (09/01/2023)  Stress: No Stress Concern Present (07/28/2021)  Tobacco Use: Medium Risk (09/02/2023)   SDOH Interventions: Food Insecurity Interventions: Intervention Not Indicated, Inpatient TOC Housing Interventions: Intervention Not Indicated, Inpatient TOC Transportation Interventions: Intervention Not Indicated, Inpatient TOC Utilities Interventions: Intervention Not Indicated, Inpatient TOC   Readmission Risk Interventions    09/04/2023    4:09 PM 01/11/2023    5:08 PM  Readmission Risk Prevention Plan  Transportation Screening Complete Complete  Medication Review (RN Care Manager) Complete  Referral to Pharmacy  PCP or Specialist appointment within 3-5 days of discharge Complete Complete  HRI or Home Care Consult Complete Complete  SW Recovery Care/Counseling Consult Complete Complete  Palliative Care Screening Not Applicable Not Applicable  Skilled Nursing Facility Not Applicable Not Applicable

## 2023-09-04 NOTE — Progress Notes (Addendum)
 Patient stated 7/10 pain and unable to swallow PO medications with NG tube. Notified J. Garner Nash, NP. He highly recommended giving it by NG tube. Will continue to monitor.

## 2023-09-04 NOTE — Progress Notes (Signed)
 PT Cancellation Note  Patient Details Name: Marcus [name] MRN: [id number] DOB: 23-Dec-1940   Cancelled Treatment:    Reason Eval/Treat Not Completed: Patient declined, no reason specified   [facility] 09/04/2023, 2:01 PM

## 2023-09-04 NOTE — Progress Notes (Signed)
 PROGRESS NOTE  Marcus [name] ZOX:[id number] DOB: 13-Oct-1940 DOA: 09/01/2023 PCP: [name], NP   LOS: 3 days   Brief Narrative / Interim history: 83 year old male with pancreatic cancer, obstructive jaundice with history of ERCP and stent placement, HTN, CAD, OSA, currently getting chemo comes into the hospital with few days of abdominal pain, nausea, decreased p.o. intake.  Imaging in the ED with a CT scan of the abdomen and pelvis showed cholelithiasis with inflammatory stranding around the gallbladder, also dilated stomach with inflammatory stranding around the duodenal area, could not exclude tumor involvement.  CBD stent was in place.  General surgery was consulted and he was admitted to the hospital  Subjective / 24h Interval events: Doing well, no nausea or vomiting.  Assesement and Plan: Principal Problem:   Acute cholecystitis Active Problems:   Atrial fibrillation, chronic (HCC)   Pancreatic adenocarcinoma (HCC)   History of biliary duct stent placement   GERD (gastroesophageal reflux disease)   Cardiac pacemaker in situ   Biliary obstruction   Gastric outlet obstruction   Malignant neoplasm of head of pancreas (HCC)   Abnormal CT of the abdomen   Principal problem Concern for gastric outlet obstruction -continue n.p.o., IV fluids.  Gastroenterology consulted, appreciate input.  Looks like there are plans for an EGD on Monday.  This may be related to cancer progression -Continue NG tube, n.p.o. past midnight  Active problems Cholelithiasis, concern for acute cholecystitis-General Surgery consulted and following.  HIDA scan was positive for acute cholecystitis on 2/28, he is now status post percutaneous cholecystostomy 3/1. -Doing well following the procedure, has some soreness at the site  Pancreatic cancer-receiving treatment as an outpatient.  Hold Xeloda now.  Oncology following  Pancytopenia-in the setting of malignancy, chemotherapy, counts stable,  platelets, hemoglobin overall stable  History of CAD, CHB status post pacemaker, chronic A-fib -Eliquis on hold, on heparin infusion pending EGD  OSA-continue CPAP  Bronchiectasis-stable, no symptoms  Obesity, class I-BMI 33  Scheduled Meds:  Chlorhexidine Gluconate Cloth  6 each Topical Daily   pantoprazole (PROTONIX) IV  40 mg Intravenous Q12H   sodium chloride flush  10-40 mL Intracatheter Q12H   sodium chloride flush  3 mL Intravenous Q12H   sodium chloride flush  5 mL Intracatheter Q8H   sodium chloride HYPERTONIC  4 mL Nebulization BID   Continuous Infusions:  heparin 1,200 Units/hr (09/03/23 2101)   piperacillin-tazobactam (ZOSYN)  IV 3.375 g (09/04/23 0530)   PRN Meds:.acetaminophen **OR** acetaminophen, albuterol, bisacodyl, hydrALAZINE, menthol-cetylpyridinium, ondansetron **OR** ondansetron (ZOFRAN) IV, oxyCODONE, oxymetazoline, phenol, polyethylene glycol, sodium chloride flush, white petrolatum  Current Outpatient Medications  Medication Instructions   acetaminophen (TYLENOL) 650 mg, Oral, Every 6 hours PRN   allopurinol (ZYLOPRIM) 300 mg, Oral, Daily   amLODipine (NORVASC) 10 mg, Oral, Daily   bisacodyl (DULCOLAX) 10 mg, Oral, Daily PRN   Calcium Carb-Cholecalciferol (CALCIUM 600 + D PO) 1 tablet, Daily   capecitabine (XELODA) 1,500 mg, Oral, 2 times daily after meals, Take Monday-Friday. Take only days of radiation.   Carboxymethylcellulose Sod PF 0.5 % SOLN 1 drop, Ophthalmic, 4 times daily   cyanocobalamin (VITAMIN B12) 500 MCG tablet 1 tablet, Oral, 2 times daily   docusate sodium (COLACE) 100 mg, 2 times daily   Eliquis 5 mg, Oral, 2 times daily   Eszopiclone 3 mg, Oral, Daily at bedtime, Take immediately before bedtime   fluticasone (FLONASE) 50 MCG/ACT nasal spray 2 sprays, Each Nare, Daily   glycerin adult 2 g suppository 1  suppository, Rectal, As needed   ipratropium (ATROVENT) 0.03 % nasal spray 2 sprays, Each Nare, Every 12 hours   lidocaine-prilocaine  (EMLA) cream 1 Application, Topical, As needed   mirabegron ER (MYRBETRIQ) 50 MG TB24 tablet 1 tablet, Oral, Daily   mirtazapine (REMERON) 15 mg, Oral, Daily at bedtime   ondansetron (ZOFRAN) 4 mg, Oral, Every 4 hours PRN   oxybutynin (DITROPAN) 5 mg, Oral, 2 times daily PRN   oxyCODONE (ROXICODONE) 5 mg, Oral, Every 6 hours PRN   polyethylene glycol powder (GLYCOLAX/MIRALAX) 17 GM/SCOOP powder Mix 4 capfuls in a 32 oz Gatorade, Can repeat the next day for a satisfactory bowel movement. THEN one capful daily for maintenance   Potassium Gluconate 550 mg, Oral, Daily   Respiratory Therapy Supplies (FLUTTER) DEVI 1 each, Does not apply, 2 times daily   rosuvastatin (CRESTOR) 20 mg, Oral, Daily   Sodium Chloride Flush (NORMAL SALINE FLUSH) 0.9 % SOLN 10 mLs, Intravenous, Daily   sodium chloride HYPERTONIC 3 % nebulizer solution Nebulization, As needed   tamsulosin (FLOMAX) 0.4 MG CAPS capsule 1 capsule, Oral, Daily   traZODone (DESYREL) 50 MG tablet 1 tablet, Oral, At bedtime PRN   valsartan-hydrochlorothiazide (DIOVAN-HCT) 160-25 MG tablet 1 tablet, Daily    Diet Orders (From admission, onward)     Start     Ordered   09/03/23 1517  Diet clear liquid Room service appropriate? Yes; Fluid consistency: Thin  Diet effective now       Question Answer Comment  Room service appropriate? Yes   Fluid consistency: Thin      09/03/23 1517            DVT prophylaxis: SCDs Start: 09/01/23 1934   Lab Results  Component Value Date   PLT 145 (L) 09/04/2023      Code Status: Limited: Do not attempt resuscitation (DNR) -DNR-LIMITED -Do Not Intubate/DNI   Family Communication: Daughter at bedside  Status is: Inpatient Remains inpatient appropriate because: Severity of illness   Level of care: Med-Surg  Consultants:  General surgery Gastroenterology Oncology  Objective: Vitals:   09/04/23 0500 09/04/23 0529 09/04/23 1056 09/04/23 1311  BP:  121/67  117/63  Pulse:  61  (!) 59   Resp:  17  16  Temp:  (!) 97.4 F (36.3 C)  97.8 F (36.6 C)  TempSrc:  Oral  Oral  SpO2:  98% 93% 97%  Weight: 92.7 kg       Intake/Output Summary (Last 24 hours) at 09/04/2023 1329 Last data filed at 09/04/2023 1029 Gross per 24 hour  Intake 1670.98 ml  Output 930 ml  Net 740.98 ml   Wt Readings from Last 3 Encounters:  09/04/23 92.7 kg  09/01/23 98.6 kg  08/25/23 98 kg    Examination:  Constitutional: NAD Eyes: lids and conjunctivae normal, no scleral icterus ENMT: mmm Neck: normal, supple Respiratory: clear to auscultation bilaterally, no wheezing, no crackles. Normal respiratory effort.  Cardiovascular: Regular rate and rhythm, no murmurs / rubs / gallops. No LE edema. Abdomen: soft, no distention, no tenderness. Bowel sounds positive.    Data Reviewed: I have independently reviewed following labs and imaging studies   CBC Recent Labs  Lab 09/01/23 1032 09/01/23 1447 09/01/23 1454 09/02/23 0215 09/03/23 0404 09/04/23 0101  WBC 3.8* 3.6*  --  3.1* 3.6* 7.6  HGB 9.7* 9.3* 9.9* 9.0* 9.2* 9.1*  HCT 30.3* 30.2* 29.0* 29.4* 30.2* 30.0*  PLT 174 150  --  138* 149* 145*  MCV 85.6  87.5  --  89.9 89.9 89.3  MCH 27.4 27.0  --  27.5 27.4 27.1  MCHC 32.0 30.8  --  30.6 30.5 30.3  RDW 16.7* 16.7*  --  16.7* 16.9* 17.0*  LYMPHSABS 0.7 0.6*  --   --   --   --   MONOABS 0.5 0.4  --   --   --   --   EOSABS 0.1 0.1  --   --   --   --   BASOSABS 0.0 0.0  --   --   --   --     Recent Labs  Lab 09/01/23 1032 09/01/23 1447 09/01/23 1454 09/02/23 0215 09/02/23 1747 09/03/23 0404  NA 137 134* 138 135  --  138  K 3.8 3.6 3.7 4.0  --  3.6  CL 100 100 101 105  --  107  CO2 29 26  --  24  --  22  GLUCOSE 125* 111* 105* 142*  --  105*  BUN 25* 24* 22 21  --  16  CREATININE 1.11 1.11 1.20 1.14  --  1.02  CALCIUM 9.0 8.7*  --  8.6*  --  8.8*  AST 19 22  --  20  --  18  ALT 15 19  --  17  --  16  ALKPHOS 96 94  --  92  --  85  BILITOT 0.4 0.4  --  0.6  --  0.7  ALBUMIN  3.6 3.1*  --  2.8*  --  2.8*  MG  --   --   --  2.1  --  2.1  INR  --   --   --   --  1.4*  --     ------------------------------------------------------------------------------------------------------------------ No results for input(s): "CHOL", "HDL", "LDLCALC", "TRIG", "CHOLHDL", "LDLDIRECT" in the last 72 hours.  Lab Results  Component Value Date   HGBA1C 6.3 11/24/2022   ------------------------------------------------------------------------------------------------------------------ No results for input(s): "TSH", "T4TOTAL", "T3FREE", "THYROIDAB" in the last 72 hours.  Invalid input(s): "FREET3"  Cardiac Enzymes No results for input(s): "CKMB", "TROPONINI", "MYOGLOBIN" in the last 168 hours.  Invalid input(s): "CK" ------------------------------------------------------------------------------------------------------------------    Component Value Date/Time   BNP 60.0 02/28/2023 0030    CBG: No results for input(s): "GLUCAP" in the last 168 hours.  Recent Results (from the past 240 hours)  Aerobic/Anaerobic Culture w Gram Stain (surgical/deep wound)     Status: None (Preliminary result)   Collection Time: 09/03/23  8:20 AM   Specimen: Abscess  Result Value Ref Range Status   Specimen Description ABSCESS  Final   Special Requests GALL BLADDER  Final   Gram Stain   Final    RARE WBC PRESENT, PREDOMINANTLY PMN ABUNDANT GRAM POSITIVE RODS ABUNDANT GRAM NEGATIVE RODS ABUNDANT GRAM POSITIVE COCCI    Culture   Final    CULTURE REINCUBATED FOR BETTER GROWTH Performed at [facility], [address], [place], [place] [address]    Report Status PENDING  Incomplete     Radiology Studies: No results found.    Pamella Pert, MD, PhD [facility]  Between 7 am - 7 pm I am available, please contact me via Amion (for emergencies) or Securechat (non urgent messages)  Between 7 pm - 7 am I am not available, please contact night coverage MD/APP via Amion

## 2023-09-04 NOTE — Discharge Instructions (Signed)
 Discharge planning: Please contact IR APP or on call IR MD prior to patient d/c to ensure appropriate follow up plans are in place. Typically patient will follow up 6-8 week for drain exchange. Cholecystomy tube drain will need to remain in place at least 6 -8  weeks unless gallbaldder surgically removed in interim; Once daily with 5 ml sterile normal saline as outpatient; will schedule f/u cholangiogram/ drain/ exchange in 6-8 weeks. IR scheduler will contact patient with date/time of appointment. Patient will need to flush drain QD with 5 cc NS, record output QD, dressing changes every 2-3 days or earlier if soiled.   IR will continue to follow - please call with questions or concerns.

## 2023-09-04 NOTE — Progress Notes (Signed)
 PHARMACY - ANTICOAGULATION CONSULT NOTE  Pharmacy Consult for heparin Indication:  atrial fibrillation (apixaban on hold)  No Known Allergies  Patient Measurements: Weight: 97 kg (213 lb 13.5 oz) Heparin Dosing Weight: 89 kg  Vital Signs: Temp: 98.2 F (36.8 C) (03/02 0022) Temp Source: Oral (03/02 0022) BP: 112/60 (03/02 0022) Pulse Rate: 63 (03/02 0022)  Labs: Recent Labs    09/01/23 1447 09/01/23 1454 09/01/23 2120 09/02/23 0215 09/02/23 1130 09/02/23 1747 09/03/23 0404 09/03/23 1549 09/04/23 0101  HGB  --  9.9*  --  9.0*  --   --  9.2*  --  9.1*  HCT  --  29.0*  --  29.4*  --   --  30.2*  --  30.0*  PLT  --   --   --  138*  --   --  149*  --  145*  APTT   < >  --  36 89*   < >  --  173* 43* 90*  LABPROT  --   --   --   --   --  17.1*  --   --   --   INR  --   --   --   --   --  1.4*  --   --   --   HEPARINUNFRC  --   --  >1.10*  --   --   --  >1.10*  --  0.97*  CREATININE  --  1.20  --  1.14  --   --  1.02  --   --    < > = values in this interval not displayed.    Estimated Creatinine Clearance: 61.9 mL/min (by C-G formula based on SCr of 1.02 mg/dL).   Medical History: Past Medical History:  Diagnosis Date   Acute meniscal tear of knee left   Arthritis    generalized   AV block 08/01/2018   Benign essential hypertension 01/18/2007   Bronchiectasis with acute exacerbation 06/23/2020   Chest pain, atypical 08/23/2014   Chronic cough 10/07/2014   Coronary artery disease    Degeneration of lumbar intervertebral disc 10/14/2017   Diverticulosis of colon 07/07/2005   Elevated PSA 06/19/2014   GERD (gastroesophageal reflux disease) 01/18/2007   Gout, unspecified 01/18/2007   Hemorrhoids 01/19/2011   Hiatal hernia    History of colonic polyps 01/18/2007   Insomnia, unspecified 08/21/2007   Iron deficiency anemia, unspecified  01/19/2011   Lumbar post-laminectomy syndrome 10/14/2017   Lumbar spondylolysis    Lumbar strain 12/14/2011   Obstructive  sleep apnea 01/18/2007   uses CPAP nightly   Paraesophageal hernia    large   Primary osteoarthritis of knee 05/23/2015   Prostate cancer (HCC) 2016   S/P knee replacement 05/23/2015   Sensorineural hearing loss, bilateral    Vitamin B12 deficiency    Vitamin D deficiency 10/28/2009    Medications: Apixaban 5 mg PO BID PTA -Last dose: 2/26 PM  Assessment: Pt is an 62 yoM with PMH significant for pancreatic cancer s/p ERCP for obstructive jaundice. Pt chronically anticoagulated with apixaban for atrial fibrillation. Anticoagulation being transitioned to heparin - pharmacy to dose.   Today, 09/04/23 aPTT 90 sec- now therapeutic on IV heparin 1200 units/hr.  Verified heparin infusing peripherally & labs drawn via port Heparin level 0.97- elevated but trending down as anticipated due to interaction with apixaban CBC: Hgb 9.1, Plt 145 - low/stable RN reports no bleeding or infusion related concerns . Renal function stable  Goal of Therapy:  Heparin level 0.3-0.7 units/ml aPTT 66-102 seconds Monitor platelets by anticoagulation protocol: Yes   Plan:  Continue IV heparin 1200 units/hr Check confirmatory aPTT at 1000 CBC, heparin level/aPTT daily. Once heparin level and aPTT correlate, can monitor using heparin level only Monitor for signs of bleeding   If heparin needs to be held for any procedures, please provide pharmacy with instructions for when to hold.  Junita Push, PharmD, BCPS 09/04/2023 2:27 AM

## 2023-09-05 ENCOUNTER — Other Ambulatory Visit (HOSPITAL_COMMUNITY): Payer: Self-pay

## 2023-09-05 ENCOUNTER — Inpatient Hospital Stay (HOSPITAL_COMMUNITY): Admitting: Anesthesiology

## 2023-09-05 ENCOUNTER — Encounter (HOSPITAL_COMMUNITY): Payer: Self-pay | Admitting: Internal Medicine

## 2023-09-05 ENCOUNTER — Inpatient Hospital Stay (HOSPITAL_COMMUNITY)

## 2023-09-05 ENCOUNTER — Encounter (HOSPITAL_COMMUNITY)
Admission: EM | Disposition: A | Discharge status: Home Health | Payer: Self-pay | Source: Home / Self Care | Attending: Internal Medicine

## 2023-09-05 ENCOUNTER — Ambulatory Visit: Payer: No Typology Code available for payment source | Admitting: Oncology

## 2023-09-05 DIAGNOSIS — I4891 Unspecified atrial fibrillation: Secondary | ICD-10-CM | POA: Diagnosis not present

## 2023-09-05 DIAGNOSIS — K2289 Other specified disease of esophagus: Secondary | ICD-10-CM

## 2023-09-05 DIAGNOSIS — K315 Obstruction of duodenum: Secondary | ICD-10-CM

## 2023-09-05 DIAGNOSIS — K3189 Other diseases of stomach and duodenum: Secondary | ICD-10-CM | POA: Diagnosis not present

## 2023-09-05 DIAGNOSIS — K810 Acute cholecystitis: Secondary | ICD-10-CM | POA: Diagnosis not present

## 2023-09-05 DIAGNOSIS — I2510 Atherosclerotic heart disease of native coronary artery without angina pectoris: Secondary | ICD-10-CM

## 2023-09-05 DIAGNOSIS — J449 Chronic obstructive pulmonary disease, unspecified: Secondary | ICD-10-CM | POA: Diagnosis not present

## 2023-09-05 DIAGNOSIS — K2980 Duodenitis without bleeding: Secondary | ICD-10-CM

## 2023-09-05 DIAGNOSIS — Z980 Intestinal bypass and anastomosis status: Secondary | ICD-10-CM

## 2023-09-05 DIAGNOSIS — Z9889 Other specified postprocedural states: Secondary | ICD-10-CM

## 2023-09-05 DIAGNOSIS — T182XXA Foreign body in stomach, initial encounter: Secondary | ICD-10-CM | POA: Diagnosis not present

## 2023-09-05 HISTORY — PX: BIOPSY: SHX5522

## 2023-09-05 HISTORY — PX: ESOPHAGOGASTRODUODENOSCOPY (EGD) WITH PROPOFOL: SHX5813

## 2023-09-05 HISTORY — PX: DUODENAL STENT PLACEMENT: SHX5541

## 2023-09-05 LAB — CBC
HCT: 29 % — ABNORMAL LOW (ref 39.0–52.0)
Hemoglobin: 8.7 g/dL — ABNORMAL LOW (ref 13.0–17.0)
MCH: 26.9 pg (ref 26.0–34.0)
MCHC: 30 g/dL (ref 30.0–36.0)
MCV: 89.8 fL (ref 80.0–100.0)
Platelets: 143 10*3/uL — ABNORMAL LOW (ref 150–400)
RBC: 3.23 MIL/uL — ABNORMAL LOW (ref 4.22–5.81)
RDW: 17.1 % — ABNORMAL HIGH (ref 11.5–15.5)
WBC: 4.5 10*3/uL (ref 4.0–10.5)
nRBC: 0 % (ref 0.0–0.2)

## 2023-09-05 LAB — APTT: aPTT: 99 s — ABNORMAL HIGH (ref 24–36)

## 2023-09-05 LAB — HEPARIN LEVEL (UNFRACTIONATED): Heparin Unfractionated: 0.73 [IU]/mL — ABNORMAL HIGH (ref 0.30–0.70)

## 2023-09-05 SURGERY — ESOPHAGOGASTRODUODENOSCOPY (EGD) WITH PROPOFOL
Anesthesia: Monitor Anesthesia Care

## 2023-09-05 MED ORDER — HEPARIN (PORCINE) 25000 UT/250ML-% IV SOLN
1350.0000 [IU]/h | INTRAVENOUS | Status: DC
  Administered 2023-09-05: 1200 [IU]/h via INTRAVENOUS
  Filled 2023-09-05: qty 250

## 2023-09-05 MED ORDER — PHENYLEPHRINE 80 MCG/ML (10ML) SYRINGE FOR IV PUSH (FOR BLOOD PRESSURE SUPPORT)
PREFILLED_SYRINGE | INTRAVENOUS | Status: DC | PRN
  Administered 2023-09-05 (×5): 80 ug via INTRAVENOUS

## 2023-09-05 MED ORDER — LIDOCAINE 2% (20 MG/ML) 5 ML SYRINGE
INTRAMUSCULAR | Status: DC | PRN
  Administered 2023-09-05: 60 mg via INTRAVENOUS

## 2023-09-05 MED ORDER — ONDANSETRON HCL 4 MG/2ML IJ SOLN
INTRAMUSCULAR | Status: DC | PRN
  Administered 2023-09-05: 4 mg via INTRAVENOUS

## 2023-09-05 MED ORDER — SUCCINYLCHOLINE CHLORIDE 200 MG/10ML IV SOSY
PREFILLED_SYRINGE | INTRAVENOUS | Status: DC | PRN
  Administered 2023-09-05: 120 mg via INTRAVENOUS

## 2023-09-05 MED ORDER — SODIUM CHLORIDE 0.9 % IV SOLN
INTRAVENOUS | Status: AC | PRN
  Administered 2023-09-05: 500 mL via INTRAMUSCULAR

## 2023-09-05 MED ORDER — PROPOFOL 500 MG/50ML IV EMUL
INTRAVENOUS | Status: DC | PRN
  Administered 2023-09-05: 25 ug/kg/min via INTRAVENOUS

## 2023-09-05 MED ORDER — PROPOFOL 500 MG/50ML IV EMUL
INTRAVENOUS | Status: AC
  Filled 2023-09-05: qty 50

## 2023-09-05 MED ORDER — PROPOFOL 10 MG/ML IV BOLUS
INTRAVENOUS | Status: DC | PRN
  Administered 2023-09-05: 150 mg via INTRAVENOUS

## 2023-09-05 MED ORDER — PROPOFOL 10 MG/ML IV BOLUS
INTRAVENOUS | Status: AC
  Filled 2023-09-05: qty 20

## 2023-09-05 SURGICAL SUPPLY — 2 items
ELECT REM PT RETURN 9FT ADLT (ELECTROSURGICAL) IMPLANT
NDL SCLEROTHERAPY 25GX240 (NEEDLE) IMPLANT

## 2023-09-05 NOTE — Interval H&P Note (Signed)
 History and Physical Interval Note:  09/05/2023 1:24 PM  Marcus [name]  has presented today for surgery, with the diagnosis of gastric outlet obstruction.  The various methods of treatment have been discussed with the patient and family. After consideration of risks, benefits and other options for treatment, the patient has consented to  Procedure(s): ESOPHAGOGASTRODUODENOSCOPY (EGD) WITH PROPOFOL (N/A) DUODENAL STENT PLACEMENT (N/A) as a surgical intervention.  The patient's history has been reviewed, patient examined, no change in status, stable for surgery.  I have reviewed the patient's chart and labs.  Questions were answered to the patient's satisfaction.    Long conversation with patient and patient's family.  Plan to attempt to further evaluate what seems to be gastric outlet obstruction but not clear if this is a result of inflammation from cholecystitis versus tumor infiltration versus issues from prior surgical region.  Enteral stenting is possible.  They understand the increased risk of perforation upwards of 10% for malignant stenting and upwards of 20% for benign strictures.  [employer]

## 2023-09-05 NOTE — Plan of Care (Signed)
  Problem: Education: Goal: Knowledge of General Education information will improve Description: Including pain rating scale, medication(s)/side effects and non-pharmacologic comfort measures Outcome: Progressing   Problem: Health Behavior/Discharge Planning: Goal: Ability to manage health-related needs will improve Outcome: Progressing   Problem: Clinical Measurements: Goal: Ability to maintain clinical measurements within normal limits will improve Outcome: Progressing Goal: Will remain free from infection Outcome: Progressing Goal: Diagnostic test results will improve Outcome: Progressing Goal: Respiratory complications will improve Outcome: Progressing Goal: Cardiovascular complication will be avoided Outcome: Progressing   Problem: Activity: Goal: Risk for activity intolerance will decrease Outcome: Progressing   Problem: Coping: Goal: Level of anxiety will decrease Outcome: Progressing   Problem: Elimination: Goal: Will not experience complications related to urinary retention Outcome: Progressing   Problem: Pain Managment: Goal: General experience of comfort will improve and/or be controlled Outcome: Progressing   Problem: Safety: Goal: Ability to remain free from injury will improve Outcome: Progressing   Problem: Skin Integrity: Goal: Risk for impaired skin integrity will decrease Outcome: Progressing

## 2023-09-05 NOTE — Progress Notes (Signed)
   09/05/23 2311  BiPAP/CPAP/SIPAP  BiPAP/CPAP/SIPAP Pt Type Adult  Reason BIPAP/CPAP not in use NG tube in place

## 2023-09-05 NOTE — Plan of Care (Signed)
   Problem: Education: Goal: Knowledge of General Education information will improve Description: Including pain rating scale, medication(s)/side effects and non-pharmacologic comfort measures Outcome: Progressing   Problem: Activity: Goal: Risk for activity intolerance will decrease Outcome: Progressing

## 2023-09-05 NOTE — Progress Notes (Signed)
 Progress Note     Subjective: Pt irritated by NGT and reports pain in throat. He is having bowel function. Reports stable abdominal pain. Perc chole drain in place.   Objective: Vital signs in last 24 hours: Temp:  [97.4 F (36.3 C)-97.8 F (36.6 C)] 97.6 F (36.4 C) (03/03 0600) Pulse Rate:  [58-60] 58 (03/03 0600) Resp:  [15-16] 16 (03/03 0600) BP: (117-153)/(63-74) 153/74 (03/03 0600) SpO2:  [93 %-97 %] 96 % (03/03 0600) Weight:  [92.7 kg-96.9 kg] 96.9 kg (03/03 0444) Last BM Date : 09/03/23  Intake/Output from previous day: 03/02 0701 - 03/03 0700 In: 282.3 [P.O.:240; I.V.:15; IV Piggyback:17.3] Out: 1050 [Urine:400; Emesis/NG output:200; Drains:450] Intake/Output this shift: No intake/output data recorded.  PE: General: WD, elderly male who is laying in bed in NAD HEENT: sclera anicteric Lungs:  Respiratory effort nonlabored Abd: soft, mildly ttp in RUQ without peritonitis, mild distention, NGT with thin drainage, perc chole drain with thin bilious fluid    Lab Results:  Recent Labs    09/04/23 0101 09/05/23 0500  WBC 7.6 4.5  HGB 9.1* 8.7*  HCT 30.0* 29.0*  PLT 145* 143*   BMET Recent Labs    09/03/23 0404  NA 138  K 3.6  CL 107  CO2 22  GLUCOSE 105*  BUN 16  CREATININE 1.02  CALCIUM 8.8*   PT/INR Recent Labs    09/02/23 1747  LABPROT 17.1*  INR 1.4*   CMP     Component Value Date/Time   NA 138 09/03/2023 0404   NA 144 01/18/2023 1209   K 3.6 09/03/2023 0404   CL 107 09/03/2023 0404   CO2 22 09/03/2023 0404   GLUCOSE 105 (H) 09/03/2023 0404   BUN 16 09/03/2023 0404   BUN 20 01/18/2023 1209   CREATININE 1.02 09/03/2023 0404   CREATININE 1.11 09/01/2023 1032   CREATININE 1.24 01/01/2011 1609   CALCIUM 8.8 (L) 09/03/2023 0404   PROT 6.0 (L) 09/03/2023 0404   ALBUMIN 2.8 (L) 09/03/2023 0404   AST 18 09/03/2023 0404   AST 19 09/01/2023 1032   ALT 16 09/03/2023 0404   ALT 15 09/01/2023 1032   ALKPHOS 85 09/03/2023 0404   BILITOT  0.7 09/03/2023 0404   BILITOT 0.4 09/01/2023 1032   GFRNONAA >60 09/03/2023 0404   GFRNONAA >60 09/01/2023 1032   GFRAA >60 02/07/2019 1446   Lipase     Component Value Date/Time   LIPASE 32 09/01/2023 1447       Studies/Results: No results found.  Anti-infectives: Anti-infectives (From admission, onward)    Start     Dose/Rate Route Frequency Ordered Stop   09/02/23 0400  piperacillin-tazobactam (ZOSYN) IVPB 3.375 g        3.375 g 12.5 mL/hr over 240 Minutes Intravenous Every 8 hours 09/01/23 2000     09/01/23 1900  piperacillin-tazobactam (ZOSYN) IVPB 3.375 g        3.375 g 100 mL/hr over 30 Minutes Intravenous  Once 09/01/23 1846 09/01/23 2006        Assessment/Plan  Pancreatic adenocarcinoma, started as cT2N0 12/2022, s/p gem/abraxane 7-06/2023. Supposed to resume chemoradiation w/ Xeloda in march Gastric outlet obstruction Duodenitis Cholecystitis Portal vein occlusion   -afebrile, VSS, WBC WNL - HIDA shows suspected cholecystitis. He is not surgical candidate for cholecystectomy due to his pancreatic cancer with signs of tumor progression. Percutaneous cholecystostomy tube placed 3/1 - regarding GOO, unable to place NG on the floor.  Completed in fluoro 2/28 - appreciate GI  consult for endoscopy/consideration of stent, possible endoscopy by Dr. Meridee Score today - diet per GI - May eventually need discussions about a venting gastrostomy tube and feeding access.  - no indications for acute surgical intervention at this time, if GI successful at placing stent then no acute role for general surgery     Biliary obstruction secondary to pancreatic cancer-status post ERCP/stent placement 12/13/2022 and stent exchange in august CAD Atrial fibrillation Gout Sleep apnea Status post bariatric surgery Hypertension GERD History of colon cancer in his 40s Complete heart block-pacemaker placed   LOS: 4 days   I reviewed Consultant IR,GI notes, hospitalist notes, last  24 h vitals and pain scores, last 48 h intake and output, last 24 h labs and trends, and last 24 h imaging results.  This care required moderate level of medical decision making.    Juliet Rude, [facility] 09/05/2023, 10:15 AM Please see Amion for pager number during day hours 7:00am-4:30pm

## 2023-09-05 NOTE — Progress Notes (Signed)
 PHARMACY - ANTICOAGULATION CONSULT NOTE  Pharmacy Consult for heparin  Indication: hx atrial fibrillation (PTA Eliquis on hold)  No Known Allergies  Patient Measurements: Height: 5\' 8"  (172.7 cm) Weight: 96.9 kg (213 lb 10 oz) IBW/kg (Calculated) : 68.4 Heparin Dosing Weight: 88 kg  Vital Signs: Temp: 97.6 F (36.4 C) (03/03 0600) Temp Source: Oral (03/03 0600) BP: 153/74 (03/03 0600) Pulse Rate: 58 (03/03 0600)  Labs: Recent Labs    09/02/23 1130 09/02/23 1747 09/03/23 0404 09/03/23 1549 09/04/23 0101 09/04/23 1041 09/05/23 0500  HGB   < >  --  9.2*  --  9.1*  --  8.7*  HCT  --   --  30.2*  --  30.0*  --  29.0*  PLT  --   --  149*  --  145*  --  143*  APTT  --   --  173*   < > 90* 94* 99*  LABPROT  --  17.1*  --   --   --   --   --   INR  --  1.4*  --   --   --   --   --   HEPARINUNFRC  --   --  >1.10*  --  0.97*  --  0.73*  CREATININE  --   --  1.02  --   --   --   --    < > = values in this interval not displayed.    Estimated Creatinine Clearance: 61.9 mL/min (by C-G formula based on SCr of 1.02 mg/dL).   Medical History: Past Medical History:  Diagnosis Date   Acute meniscal tear of knee left   Arthritis    generalized   AV block 08/01/2018   Benign essential hypertension 01/18/2007   Bronchiectasis with acute exacerbation 06/23/2020   Chest pain, atypical 08/23/2014   Chronic cough 10/07/2014   Coronary artery disease    Degeneration of lumbar intervertebral disc 10/14/2017   Diverticulosis of colon 07/07/2005   Elevated PSA 06/19/2014   GERD (gastroesophageal reflux disease) 01/18/2007   Gout, unspecified 01/18/2007   Hemorrhoids 01/19/2011   Hiatal hernia    History of colonic polyps 01/18/2007   Insomnia, unspecified 08/21/2007   Iron deficiency anemia, unspecified  01/19/2011   Lumbar post-laminectomy syndrome 10/14/2017   Lumbar spondylolysis    Lumbar strain 12/14/2011   Obstructive sleep apnea 01/18/2007   uses CPAP nightly    Paraesophageal hernia    large   Primary osteoarthritis of knee 05/23/2015   Prostate cancer (HCC) 2016   S/P knee replacement 05/23/2015   Sensorineural hearing loss, bilateral    Vitamin B12 deficiency    Vitamin D deficiency 10/28/2009    Medications:  - on Eliquis 5mg  bid PTA (last dose taken on 08/31/23)  Assessment: Patient is an 83 y.o M with pancreatic cancer, obstructive jaundice (s/p ERCP stent placement), and hx afib on Eliquis PTA who was instructed to come to the ED on 09/01/23 by his oncology team for evaluation of n/v and abdominal distention. Abdominal/pelvis CT on 09/01/23 showed findings with concern for acute cholecystitis, gastritis or obstruction, and stable pancreatic head mass. Eliquis held on admission and changed to heparin due to GOO and in case invasive intervention is needed.  Today, 09/05/2023: - aPTT is therapeutic at 99 secs , heparin level is slightly elevated at 0.73 (aPTT and heparin levels are not correlating due to residual effect of Eliquis). Will adjust heparin using aPTT until levels correlate, then use  heparin alone for monitoring. - hgb down slightly to 8.7, plts stable at 143K - Plan for EGD at 1p today with heparin drip scheduled to stop at 9a for procedure   Goal of Therapy:  Heparin level 0.3-0.7 units/ml aPTT 66-102 seconds Monitor platelets by anticoagulation protocol: Yes   Plan:  - continue heparin drip at 1200 units/hr  - stop heparin drip at 9a for EGD  - GI team, please advise if/when heparin drip can be resumed s/p procedure   Lennyn Gange P 09/05/2023,7:41 AM

## 2023-09-05 NOTE — Anesthesia Procedure Notes (Signed)
 Procedure Name: Intubation Date/Time: 09/05/2023 1:56 PM  Performed by: Ludwig Lean, CRNAPre-anesthesia Checklist: Patient identified, Emergency Drugs available, Suction available and Patient being monitored Patient Re-evaluated:Patient Re-evaluated prior to induction Oxygen Delivery Method: Circle system utilized Preoxygenation: Pre-oxygenation with 100% oxygen Induction Type: IV induction and Rapid sequence Laryngoscope Size: Mac and 4 Grade View: Grade II Tube type: Oral Tube size: 7.5 mm Number of attempts: 1 Airway Equipment and Method: Stylet Placement Confirmation: ETT inserted through vocal cords under direct vision, positive ETCO2 and breath sounds checked- equal and bilateral Secured at: 22 cm Tube secured with: Tape Dental Injury: Teeth and Oropharynx as per pre-operative assessment  Difficulty Due To: Difficulty was anticipated and Difficult Airway- due to anterior larynx

## 2023-09-05 NOTE — Progress Notes (Signed)
 PT Cancellation Note  Patient Details Name: Marcus [name] MRN: [id number] DOB: 05-19-1941   Cancelled Treatment:    Reason Eval/Treat Not Completed: Other (comment). Pt is preparing for a EGD today and declined therapy this am. Pt indicated he may be agreeable s/p procedure. PT to return later in the day if schedule allows. PT to continue to follow acutely.   Johnny Bridge, PT Acute Rehab  Jacqualyn Posey 09/05/2023, 12:50 PM

## 2023-09-05 NOTE — Progress Notes (Signed)
 Pharmacy - IV heparin  Assessment:    Please see note from Dorna Leitz, PharmD earlier today for full details.  Briefly, 83 y.o. male on IV heparin for Afib while Eliquis on hold.  Heparin infusion held at 0900 today for EGD Previously therapeutic aPTT on 1200 units/hr Heparin levels elevated d/t recent Eliquis; trending down as expected, but not yet correlating with aPTT Now s/p EGD; no immediate complications noted Per GI, resume heparin at 8pm tonight w/o bolus  Plan:  Resume heparin at 20:00, running at 1200 units/hr; no bolus Daily CBC, aPTT, & HL  Bernadene Person, PharmD, BCPS [phone] 09/05/2023, 5:04 PM

## 2023-09-05 NOTE — Transfer of Care (Signed)
 Immediate Anesthesia Transfer of Care Note  Patient: Marcus [name]  Procedure(s) Performed: ESOPHAGOGASTRODUODENOSCOPY (EGD) WITH PROPOFOL DUODENAL STENT PLACEMENT BIOPSY  Patient Location: PACU  Anesthesia Type:General  Level of Consciousness: awake, alert , and oriented  Airway & Oxygen Therapy: Patient Spontanous Breathing and Patient connected to face mask oxygen  Post-op Assessment: Report given to RN and Post -op Vital signs reviewed and stable  Post vital signs: Reviewed and stable  Last Vitals:  Vitals Value Taken Time  BP 143/59 09/05/23 1500  Temp    Pulse 62 09/05/23 1501  Resp 15 09/05/23 1501  SpO2 100 % 09/05/23 1501  Vitals shown include unfiled device data.  Last Pain:  Vitals:   09/05/23 1322  TempSrc: Temporal  PainSc: 0-No pain      Patients Stated Pain Goal: 2 (09/05/23 1000)  Complications:  Encounter Notable Events  Notable Event Outcome Phase Comment  Difficult to intubate - expected  Intraprocedure Filed from anesthesia note documentation.

## 2023-09-05 NOTE — Progress Notes (Signed)
 PROGRESS NOTE  Marcus [name] JXB:[id number] DOB: Jul 16, 1940 DOA: 09/01/2023 PCP: [name], NP   LOS: 4 days   Brief Narrative / Interim history: 83 year old male with pancreatic cancer, obstructive jaundice with history of ERCP and stent placement, HTN, CAD, OSA, currently getting chemo comes into the hospital with few days of abdominal pain, nausea, decreased p.o. intake.  Imaging in the ED with a CT scan of the abdomen and pelvis showed cholelithiasis with inflammatory stranding around the gallbladder, also dilated stomach with inflammatory stranding around the duodenal area, could not exclude tumor involvement.  CBD stent was in place.  General surgery was consulted and he was admitted to the hospital  Subjective / 24h Interval events: He is being bothered by the NG tube.  Awaiting procedure later today  Assesement and Plan: Principal Problem:   Acute cholecystitis Active Problems:   Atrial fibrillation, chronic (HCC)   Pancreatic adenocarcinoma (HCC)   History of biliary duct stent placement   GERD (gastroesophageal reflux disease)   Cardiac pacemaker in situ   Biliary obstruction   Gastric outlet obstruction   Malignant neoplasm of head of pancreas (HCC)   Abnormal CT of the abdomen   Principal problem Concern for gastric outlet obstruction -continue n.p.o., IV fluids.  Gastroenterology consulted, appreciate input.  This may be related to cancer progression -Continue NG tube, n.p.o., EGD this afternoon  Active problems Cholelithiasis, concern for acute cholecystitis-General Surgery consulted and following.  HIDA scan was positive for acute cholecystitis on 2/28, he is now status post percutaneous cholecystostomy 3/1. -Doing well following the procedure, has some soreness at the site  Pancreatic cancer-receiving treatment as an outpatient.  Hold Xeloda now.  Oncology following  Pancytopenia-in the setting of malignancy, chemotherapy, counts stable, platelets, counts  are stable  History of CAD, CHB status post pacemaker, chronic A-fib -Eliquis on hold, on heparin infusion pending EGD  OSA-continue CPAP  Bronchiectasis-stable, no symptoms  Obesity, class I-BMI 33  Scheduled Meds:  Chlorhexidine Gluconate Cloth  6 each Topical Daily   pantoprazole (PROTONIX) IV  40 mg Intravenous Q12H   sodium chloride flush  10-40 mL Intracatheter Q12H   sodium chloride flush  3 mL Intravenous Q12H   sodium chloride flush  5 mL Intracatheter Q8H   Continuous Infusions:  piperacillin-tazobactam (ZOSYN)  IV 3.375 g (09/05/23 0541)   PRN Meds:.acetaminophen **OR** acetaminophen, albuterol, bisacodyl, hydrALAZINE, menthol-cetylpyridinium, ondansetron **OR** ondansetron (ZOFRAN) IV, oxyCODONE, oxymetazoline, phenol, polyethylene glycol, sodium chloride flush, white petrolatum  Current Outpatient Medications  Medication Instructions   acetaminophen (TYLENOL) 650 mg, Oral, Every 6 hours PRN   allopurinol (ZYLOPRIM) 300 mg, Oral, Daily   amLODipine (NORVASC) 10 mg, Oral, Daily   bisacodyl (DULCOLAX) 10 mg, Oral, Daily PRN   Calcium Carb-Cholecalciferol (CALCIUM 600 + D PO) 1 tablet, Daily   capecitabine (XELODA) 1,500 mg, Oral, 2 times daily after meals, Take Monday-Friday. Take only days of radiation.   Carboxymethylcellulose Sod PF 0.5 % SOLN 1 drop, Ophthalmic, 4 times daily   cyanocobalamin (VITAMIN B12) 500 MCG tablet 1 tablet, Oral, 2 times daily   docusate sodium (COLACE) 100 mg, 2 times daily   Eliquis 5 mg, Oral, 2 times daily   Eszopiclone 3 mg, Oral, Daily at bedtime, Take immediately before bedtime   fluticasone (FLONASE) 50 MCG/ACT nasal spray 2 sprays, Each Nare, Daily   glycerin adult 2 g suppository 1 suppository, Rectal, As needed   ipratropium (ATROVENT) 0.03 % nasal spray 2 sprays, Each Nare, Every 12 hours  lidocaine-prilocaine (EMLA) cream 1 Application, Topical, As needed   mirabegron ER (MYRBETRIQ) 50 MG TB24 tablet 1 tablet, Oral, Daily    mirtazapine (REMERON) 15 mg, Oral, Daily at bedtime   ondansetron (ZOFRAN) 4 mg, Oral, Every 4 hours PRN   oxybutynin (DITROPAN) 5 mg, Oral, 2 times daily PRN   oxyCODONE (ROXICODONE) 5 mg, Oral, Every 6 hours PRN   polyethylene glycol powder (GLYCOLAX/MIRALAX) 17 GM/SCOOP powder Mix 4 capfuls in a 32 oz Gatorade, Can repeat the next day for a satisfactory bowel movement. THEN one capful daily for maintenance   Potassium Gluconate 550 mg, Oral, Daily   Respiratory Therapy Supplies (FLUTTER) DEVI 1 each, Does not apply, 2 times daily   rosuvastatin (CRESTOR) 20 mg, Oral, Daily   Sodium Chloride Flush (NORMAL SALINE FLUSH) 0.9 % SOLN 10 mLs, Intravenous, Daily   sodium chloride HYPERTONIC 3 % nebulizer solution Nebulization, As needed   tamsulosin (FLOMAX) 0.4 MG CAPS capsule 1 capsule, Oral, Daily   traZODone (DESYREL) 50 MG tablet 1 tablet, Oral, At bedtime PRN   valsartan-hydrochlorothiazide (DIOVAN-HCT) 160-25 MG tablet 1 tablet, Daily    Diet Orders (From admission, onward)     Start     Ordered   09/05/23 0001  Diet NPO time specified  Diet effective midnight        09/04/23 1332            DVT prophylaxis: SCDs Start: 09/01/23 1934   Lab Results  Component Value Date   PLT 143 (L) 09/05/2023      Code Status: Limited: Do not attempt resuscitation (DNR) -DNR-LIMITED -Do Not Intubate/DNI   Family Communication: Daughter at bedside  Status is: Inpatient Remains inpatient appropriate because: Severity of illness   Level of care: Med-Surg  Consultants:  General surgery Gastroenterology Oncology  Objective: Vitals:   09/04/23 2156 09/04/23 2332 09/05/23 0444 09/05/23 0600  BP: 139/66   (!) 153/74  Pulse: 60   (!) 58  Resp: 15   16  Temp: (!) 97.4 F (36.3 C)   97.6 F (36.4 C)  TempSrc: Oral   Oral  SpO2: 97%   96%  Weight:  92.7 kg 96.9 kg   Height:  5\' 8"  (1.727 m)      Intake/Output Summary (Last 24 hours) at 09/05/2023 1138 Last data filed at  09/05/2023 1000 Gross per 24 hour  Intake 145 ml  Output 800 ml  Net -655 ml   Wt Readings from Last 3 Encounters:  09/05/23 96.9 kg  09/01/23 98.6 kg  08/25/23 98 kg    Examination:  Constitutional: NAD Eyes: lids and conjunctivae normal, no scleral icterus ENMT: mmm Neck: normal, supple Respiratory: clear to auscultation bilaterally, no wheezing, no crackles. Normal respiratory effort.  Cardiovascular: Regular rate and rhythm, no murmurs / rubs / gallops. No LE edema. Abdomen: soft, no distention, no tenderness. Bowel sounds positive.    Data Reviewed: I have independently reviewed following labs and imaging studies   CBC Recent Labs  Lab 09/01/23 1032 09/01/23 1032 09/01/23 1447 09/01/23 1454 09/02/23 0215 09/03/23 0404 09/04/23 0101 09/05/23 0500  WBC 3.8*  --  3.6*  --  3.1* 3.6* 7.6 4.5  HGB 9.7*   < > 9.3* 9.9* 9.0* 9.2* 9.1* 8.7*  HCT 30.3*  --  30.2* 29.0* 29.4* 30.2* 30.0* 29.0*  PLT 174  --  150  --  138* 149* 145* 143*  MCV 85.6  --  87.5  --  89.9 89.9 89.3 89.8  MCH 27.4  --  27.0  --  27.5 27.4 27.1 26.9  MCHC 32.0  --  30.8  --  30.6 30.5 30.3 30.0  RDW 16.7*  --  16.7*  --  16.7* 16.9* 17.0* 17.1*  LYMPHSABS 0.7  --  0.6*  --   --   --   --   --   MONOABS 0.5  --  0.4  --   --   --   --   --   EOSABS 0.1  --  0.1  --   --   --   --   --   BASOSABS 0.0  --  0.0  --   --   --   --   --    < > = values in this interval not displayed.    Recent Labs  Lab 09/01/23 1032 09/01/23 1447 09/01/23 1454 09/02/23 0215 09/02/23 1747 09/03/23 0404  NA 137 134* 138 135  --  138  K 3.8 3.6 3.7 4.0  --  3.6  CL 100 100 101 105  --  107  CO2 29 26  --  24  --  22  GLUCOSE 125* 111* 105* 142*  --  105*  BUN 25* 24* 22 21  --  16  CREATININE 1.11 1.11 1.20 1.14  --  1.02  CALCIUM 9.0 8.7*  --  8.6*  --  8.8*  AST 19 22  --  20  --  18  ALT 15 19  --  17  --  16  ALKPHOS 96 94  --  92  --  85  BILITOT 0.4 0.4  --  0.6  --  0.7  ALBUMIN 3.6 3.1*  --   2.8*  --  2.8*  MG  --   --   --  2.1  --  2.1  INR  --   --   --   --  1.4*  --     ------------------------------------------------------------------------------------------------------------------ No results for input(s): "CHOL", "HDL", "LDLCALC", "TRIG", "CHOLHDL", "LDLDIRECT" in the last 72 hours.  Lab Results  Component Value Date   HGBA1C 6.3 11/24/2022   ------------------------------------------------------------------------------------------------------------------ No results for input(s): "TSH", "T4TOTAL", "T3FREE", "THYROIDAB" in the last 72 hours.  Invalid input(s): "FREET3"  Cardiac Enzymes No results for input(s): "CKMB", "TROPONINI", "MYOGLOBIN" in the last 168 hours.  Invalid input(s): "CK" ------------------------------------------------------------------------------------------------------------------    Component Value Date/Time   BNP 60.0 02/28/2023 0030    CBG: No results for input(s): "GLUCAP" in the last 168 hours.  Recent Results (from the past 240 hours)  Aerobic/Anaerobic Culture w Gram Stain (surgical/deep wound)     Status: None (Preliminary result)   Collection Time: 09/03/23  8:20 AM   Specimen: Abscess  Result Value Ref Range Status   Specimen Description ABSCESS  Final   Special Requests GALL BLADDER  Final   Gram Stain   Final    RARE WBC PRESENT, PREDOMINANTLY PMN ABUNDANT GRAM POSITIVE RODS ABUNDANT GRAM NEGATIVE RODS ABUNDANT GRAM POSITIVE COCCI    Culture   Final    CULTURE REINCUBATED FOR BETTER GROWTH Performed at [facility], [address], [place], [place] [address]    Report Status PENDING  Incomplete     Radiology Studies: No results found.    Pamella Pert, MD, PhD [facility]  Between 7 am - 7 pm I am available, please contact me via Amion (for emergencies) or Securechat (non urgent messages)  Between 7 pm -  7 am I am not available, please contact night coverage MD/APP via Amion

## 2023-09-05 NOTE — Op Note (Signed)
 [facility] Patient Name: Marcus [name] Procedure Date: 09/05/2023 MRN: [id number] Attending MD: Corliss Parish , MD, [id number] Date of Birth: February 14, 1941 CSN: [id number] Age: 83 Admit Type: Inpatient Procedure:                Upper GI endoscopy Indications:              Tumor of the GI tract, For palliative stenting of                            stenosing neoplasm of the duodenum, Abnormal CT of                            the GI tract, Nausea with vomiting Providers:                Corliss Parish, MD, Lorenza Evangelist, RN, Alan Ripper, Technician Referring MD:              Medicines:                Monitored Anesthesia Care Complications:            No immediate complications. Estimated Blood Loss:     Estimated blood loss was minimal. Procedure:                Pre-Anesthesia Assessment:                           - Prior to the procedure, a History and Physical                            was performed, and patient medications and                            allergies were reviewed. The patient's tolerance of                            previous anesthesia was also reviewed. The risks                            and benefits of the procedure and the sedation                            options and risks were discussed with the patient.                            All questions were answered, and informed consent                            was obtained. Prior Anticoagulants: The patient has                            taken heparin, last dose was day of procedure. ASA  Grade Assessment: III - A patient with severe                            systemic disease. After reviewing the risks and                            benefits, the patient was deemed in satisfactory                            condition to undergo the procedure.                           After obtaining informed consent, the endoscope was                             passed under direct vision. Throughout the                            procedure, the patient's blood pressure, pulse, and                            oxygen saturations were monitored continuously. The                            [id number]-[id number] ([id number] Olympus therapeutic endoscope                            was introduced through the mouth, and advanced to                            the second part of duodenum. The upper GI endoscopy                            was accomplished without difficulty. The patient                            tolerated the procedure. Scope In: Scope Out: Findings:      No gross lesions were noted in the entire esophagus.      The Z-line was irregular and was found 42 cm from the incisors.      Evidence of a previous surgical gastrogastrostomy was found (with       gastric pouch approximately 9 cm in length. The anastomosis was       characterized by congestion but traversed with ease.      A medium amount of food (residue) was found in the rest of the       examined/remaining stomach - suctioned via endoscope.      A deformity was found at the pylorus (see further characterization       below).      Localized severe mucosal changes characterized by congestion, friability       (with contact bleeding) and altered texture were found in the duodenal       bulb and extending into the duodenal sweep. Biopsies were taken with a       cold forceps for histology.  These mucosal changes were most concerning for a visible       malignant-appearing, intrinsic severe stenosis (approximately 4.5 cm in       short positioning) within the pyloric channel/duodenal bulb/duodenal       sweep. This was able to be traversed. Placement of a long 0.035 inch       Soft Jagwire was attempted. This passed successfully into the jejunum.       This was stented with a 22 mm x 6 cm WallFlex stent under fluoroscopic       guidance. This looked to be in good positioning with short  positioning,       but the proximal end of the stent ended within the duodenal bulb. I       could pass the therapeutic scope through the stent, but I wanted to       ensure good opening at the pylorus as well, so I then used a 2nd 22 mm x       6 cm Wallflex to telescope the stenosis and allow for better placement       overall.      A previously placed metal biliary stent was seen at the major papilla. Impression:               - No gross lesions in the entire esophagus. Z-line                            irregular, 42 cm from the incisors.                           - A previous gastric pouch with gastrogastrostomy                            was present with mild congestion at anastomosis but                            with ease of passage of scope.                           - A medium amount of food (residue) in the stomach                            within the remaining stomach.                           - Deformity in the pylorus.                           - Mucosal changes in the duodenum bulb/sweep                            leading to acquired duodenal stenosis. Biopsied.                            Prosthesis placed as above (telescoped Wallflex x                            2).                           -  Metal biliary stent at major papilla. Moderate Sedation:      Not Applicable - Patient had care per Anesthesia. Recommendation:           - The patient will be observed post-procedure,                            until all discharge criteria are met.                           - Return patient to hospital ward for ongoing care.                           - CLD today. If able to tolerate CLD for 24 hours                            then may advance to FLD for 48 hours. Then                            low-residue soft diet can be considered.                           - Observe patient's clinical course.                           - If issues persist, can consider further telescope                             enteral stenting vs transfer for attempt at                            Endoscopic GJ at [facility] vs Venting                            G-tube and Surgical J-tube.                           - Continue present medications.                           - Await pathology results.                           - The findings and recommendations were discussed                            with the patient.                           - The findings and recommendations were discussed                            with the patient's family.                           - The findings and recommendations were discussed  with the referring physician. Procedure Code(s):        --- Professional ---                           [phone], Esophagogastroduodenoscopy, flexible,                            transoral; with placement of endoscopic stent                            (includes pre- and post-dilation and guide wire                            passage, when performed)                           [id number], 59, Esophagogastroduodenoscopy, flexible,                            transoral; with biopsy, single or multiple                           [id number], Intraluminal dilation of strictures and/or                            obstructions (eg, esophagus), radiological                            supervision and interpretation Diagnosis Code(s):        --- Professional ---                           K22.89, Other specified disease of esophagus                           Z98.0, Intestinal bypass and anastomosis status                           K31.89, Other diseases of stomach and duodenum                           K31.5, Obstruction of duodenum                           D49.0, Neoplasm of unspecified behavior of                            digestive system                           R11.2, Nausea with vomiting, unspecified                           R93.3, Abnormal findings on  diagnostic imaging of                            other parts of digestive tract CPT copyright 2022 American Medical  Association. All rights reserved. The codes documented in this report are preliminary and upon coder review may  be revised to meet current compliance requirements. Corliss Parish, MD 09/05/2023 3:05:51 PM Number of Addenda: 0

## 2023-09-05 NOTE — Anesthesia Preprocedure Evaluation (Addendum)
 Anesthesia Evaluation  Patient identified by MRN, date of birth, ID band Patient awake    Reviewed: Allergy & Precautions, NPO status , Patient's Chart, lab work & pertinent test results  History of Anesthesia Complications Negative for: history of anesthetic complications  Airway Mallampati: II  TM Distance: >3 FB Neck ROM: Full    Dental  (+) Dental Advisory Given, Teeth Intact   Pulmonary sleep apnea and Continuous Positive Airway Pressure Ventilation , COPD, former smoker   Pulmonary exam normal        Cardiovascular hypertension, + CAD and +CHF  Normal cardiovascular exam+ dysrhythmias Atrial Fibrillation + pacemaker (for Mobitz-2 block)    '24 TTE - EF 45 to 50%. There is mild concentric left ventricular hypertrophy. Grade I diastolic dysfunction (impaired relaxation). Trivial mitral valve regurgitation.     Neuro/Psych  Hearing loss   negative psych ROS   GI/Hepatic Neg liver ROS, hiatal hernia,GERD  Controlled and Medicated,, Pancreatic cancer Cholecystitis s/p perc C-tube Gastric outlet obstruction    Endo/Other  negative endocrine ROS    Renal/GU negative Renal ROS    Prostate cancer     Musculoskeletal  (+) Arthritis ,    Abdominal   Peds  Hematology  (+) Blood dyscrasia, anemia  Plt 143k    Anesthesia Other Findings NGT in place   Reproductive/Obstetrics                              Anesthesia Physical Anesthesia Plan  ASA: 4  Anesthesia Plan: General   Post-op Pain Management: Minimal or no pain anticipated   Induction: Intravenous and Rapid sequence  PONV Risk Score and Plan: 2 and Treatment may vary due to age or medical condition, Ondansetron and Propofol infusion  Airway Management Planned: Oral ETT  Additional Equipment: None  Intra-op Plan:   Post-operative Plan: Extubation in OR  Informed Consent: I have reviewed the patients History and  Physical, chart, labs and discussed the procedure including the risks, benefits and alternatives for the proposed anesthesia with the patient or authorized representative who has indicated his/her understanding and acceptance.   Patient has DNR.  Discussed DNR with patient and Suspend DNR.   Dental advisory given  Plan Discussed with: CRNA and Anesthesiologist  Anesthesia Plan Comments:          Anesthesia Quick Evaluation

## 2023-09-06 ENCOUNTER — Encounter: Payer: Self-pay | Admitting: Gastroenterology

## 2023-09-06 ENCOUNTER — Inpatient Hospital Stay (HOSPITAL_COMMUNITY)

## 2023-09-06 DIAGNOSIS — K315 Obstruction of duodenum: Secondary | ICD-10-CM | POA: Diagnosis not present

## 2023-09-06 DIAGNOSIS — C259 Malignant neoplasm of pancreas, unspecified: Secondary | ICD-10-CM | POA: Diagnosis not present

## 2023-09-06 DIAGNOSIS — K311 Adult hypertrophic pyloric stenosis: Secondary | ICD-10-CM | POA: Diagnosis not present

## 2023-09-06 DIAGNOSIS — K810 Acute cholecystitis: Secondary | ICD-10-CM | POA: Diagnosis not present

## 2023-09-06 DIAGNOSIS — I4891 Unspecified atrial fibrillation: Secondary | ICD-10-CM | POA: Diagnosis not present

## 2023-09-06 DIAGNOSIS — F32A Depression, unspecified: Secondary | ICD-10-CM

## 2023-09-06 DIAGNOSIS — C250 Malignant neoplasm of head of pancreas: Secondary | ICD-10-CM | POA: Diagnosis not present

## 2023-09-06 LAB — COMPREHENSIVE METABOLIC PANEL
ALT: 12 U/L (ref 0–44)
AST: 13 U/L — ABNORMAL LOW (ref 15–41)
Albumin: 2.3 g/dL — ABNORMAL LOW (ref 3.5–5.0)
Alkaline Phosphatase: 59 U/L (ref 38–126)
Anion gap: 8 (ref 5–15)
BUN: 18 mg/dL (ref 8–23)
CO2: 23 mmol/L (ref 22–32)
Calcium: 7.9 mg/dL — ABNORMAL LOW (ref 8.9–10.3)
Chloride: 104 mmol/L (ref 98–111)
Creatinine, Ser: 1.16 mg/dL (ref 0.61–1.24)
GFR, Estimated: >60 mL/min (ref >60)
Glucose, Bld: 108 mg/dL — ABNORMAL HIGH (ref 70–99)
Potassium: 3.6 mmol/L (ref 3.5–5.1)
Sodium: 135 mmol/L (ref 135–145)
Total Bilirubin: 0.6 mg/dL (ref 0.0–1.2)
Total Protein: 5.5 g/dL — ABNORMAL LOW (ref 6.5–8.1)

## 2023-09-06 LAB — MAGNESIUM: Magnesium: 2 mg/dL (ref 1.7–2.4)

## 2023-09-06 LAB — CBC
HCT: 29.2 % — ABNORMAL LOW (ref 39.0–52.0)
Hemoglobin: 8.9 g/dL — ABNORMAL LOW (ref 13.0–17.0)
MCH: 27.2 pg (ref 26.0–34.0)
MCHC: 30.5 g/dL (ref 30.0–36.0)
MCV: 89.3 fL (ref 80.0–100.0)
Platelets: 139 10*3/uL — ABNORMAL LOW (ref 150–400)
RBC: 3.27 MIL/uL — ABNORMAL LOW (ref 4.22–5.81)
RDW: 16.9 % — ABNORMAL HIGH (ref 11.5–15.5)
WBC: 4.6 10*3/uL (ref 4.0–10.5)
nRBC: 0 % (ref 0.0–0.2)

## 2023-09-06 LAB — APTT: aPTT: 63 s — ABNORMAL HIGH (ref 24–36)

## 2023-09-06 LAB — SURGICAL PATHOLOGY

## 2023-09-06 LAB — HEPARIN LEVEL (UNFRACTIONATED): Heparin Unfractionated: 0.48 [IU]/mL (ref 0.30–0.70)

## 2023-09-06 MED ORDER — MELATONIN 5 MG PO TABS
5.0000 mg | ORAL_TABLET | Freq: Once | ORAL | Status: AC
  Administered 2023-09-06: 5 mg via ORAL
  Filled 2023-09-06: qty 1

## 2023-09-06 MED ORDER — APIXABAN 5 MG PO TABS
5.0000 mg | ORAL_TABLET | Freq: Two times a day (BID) | ORAL | Status: DC
  Administered 2023-09-06 – 2023-09-07 (×2): 5 mg via ORAL
  Filled 2023-09-06 (×2): qty 1

## 2023-09-06 NOTE — Progress Notes (Signed)
 Patient is admitted with pancreatic adenocarcinoma with biliary stent in place with disease progression supposed to resume chemotherapy and radiation this month. He presented with concern for possible cholecystitis and gastric outlet obstruction. He is now s/p percutaneous cholecystostomy and s/p stenting and tolerating a diet. No role for surgical intervention currently, general surgery will sign off at this time. Please call if we can be of further assistance.   Juliet Rude, [facility] Surgery 09/06/2023, 4:02 PM Please see Amion for pager number during day hours 7:00am-4:30pm

## 2023-09-06 NOTE — Plan of Care (Signed)
   Problem: Education: Goal: Knowledge of General Education information will improve Description: Including pain rating scale, medication(s)/side effects and non-pharmacologic comfort measures Outcome: Progressing   Problem: Activity: Goal: Risk for activity intolerance will decrease Outcome: Progressing

## 2023-09-06 NOTE — Anesthesia Postprocedure Evaluation (Signed)
 Anesthesia Post Note  Patient: Marcus [name]  Procedure(s) Performed: ESOPHAGOGASTRODUODENOSCOPY (EGD) WITH PROPOFOL INSERTION, STENT, DUODENUM BIOPSY     Patient location during evaluation: PACU Anesthesia Type: General Level of consciousness: awake and alert Pain management: pain level controlled Vital Signs Assessment: post-procedure vital signs reviewed and stable Respiratory status: spontaneous breathing, nonlabored ventilation and respiratory function stable Cardiovascular status: stable and blood pressure returned to baseline Anesthetic complications: yes   Encounter Notable Events  Notable Event Outcome Phase Comment  Difficult to intubate - expected  Intraprocedure Filed from anesthesia note documentation.    Last Vitals:  Vitals:   09/05/23 2207 09/06/23 0543  BP: 129/76 135/68  Pulse: 61 (!) 59  Resp: 17 18  Temp: 36.7 C 36.8 C  SpO2: 96% 96%    Last Pain:  Vitals:   09/06/23 0829  TempSrc:   PainSc: 3                  Beryle Lathe

## 2023-09-06 NOTE — Progress Notes (Addendum)
 IP PROGRESS NOTE  Subjective:   Marcus [name] underwent placement of a duodenal stent yesterday.  He reports feeling much better.  The abdomen is softer.  He would like to advance his diet.  He is tolerating clear liquids.  Objective: Vital signs in last 24 hours: Blood pressure (!) 101/58, pulse (!) 59, temperature 97.6 F (36.4 C), temperature source Oral, resp. rate 18, height 5\' 8"  (1.727 m), weight 209 lb 3.5 oz (94.9 kg), SpO2 98%.  Intake/Output from previous day: 03/03 0701 - 03/04 0700 In: 1017.1 [P.O.:360; I.V.:366.7; IV Piggyback:285.3] Out: 1400 [Urine:850; Drains:550]  Physical Exam:  HEENT: No thrush Lungs: Clear bilaterally Cardiac: Regular rate and rhythm Abdomen: Soft and nontender right upper quadrant biliary drain with a gauze dressing Extremities: No leg edema  Portacath/PICC-without erythema  Lab Results: Recent Labs    09/05/23 0500 09/06/23 0340  WBC 4.5 4.6  HGB 8.7* 8.9*  HCT 29.0* 29.2*  PLT 143* 139*    BMET Recent Labs    09/06/23 0340  NA 135  K 3.6  CL 104  CO2 23  GLUCOSE 108*  BUN 18  CREATININE 1.16  CALCIUM 7.9*    Lab Results  Component Value Date   CEA1 6.4 (H) 12/11/2022   CEA 1.1 03/03/2010   EAV409 1,251 (H) 08/25/2023    Studies/Results: DG Abd 2 Views Result Date: 09/06/2023 CLINICAL DATA:  Nausea, vomiting. EXAM: ABDOMEN - 2 VIEW COMPARISON:  June 22, 2022. FINDINGS: No abnormal bowel dilatation is noted. Surgical sutures are noted in left upper quadrant. Percutaneous drainage catheter is seen laterally in right upper quadrant of uncertain etiology. Probable common bile duct and duodenal stents are noted. IMPRESSION: No abnormal bowel dilatation. Electronically Signed   By: Lupita Raider M.D.   On: 09/06/2023 13:14   DG C-Arm 1-60 Min-No Report Result Date: 09/05/2023 Fluoroscopy was utilized by the requesting physician.  No radiographic interpretation.    Medications: I have reviewed the patient's current  medications.  Assessment/Plan: Pancreas cancer CT abdomen/pelvis 12/03/2022-suspected pancreas mass in the proximal body measuring 2 cm with probable mass effect on the superior aspect of the SMV, multiple hypodense liver lesions, many are new from a remote CT-indeterminate, 2 cystic lesions in the pancreas tail-nonspecific prior bariatric surgery ERCP 12/13/2022-malignant appearing common bile duct stricture, plastic stent placed, brushing cytology-no malignant cells EUS 12/23/2022-30 x 33 mm pancreas head mass with invasion of the SMV, no malignant appearing lymph nodes, 4 cysts in the visualized liver, FNA-adenocarcinoma Cycle 1 gemcitabine and abraxane 01/07/2023 CTs 01/09/2023-multiple low-attenuation liver lesions unchanged, largest of which are clearly benign fluid attenuation cyst, others more ill-defined and incompletely characterized suspicious for small metastases.  Similar appearance of hypodense mass in the pancreatic head.  Distended gallbladder with mild gallbladder wall thickening and adjacent pericholecystic fat stranding.  Sludge in the gallbladder fundus.  Common bile duct stent in place with diminished, persistent intrahepatic biliary ductal dilatation.  Negative for pulmonary embolism.  Acute appearing heterogeneous airspace opacity of the superior segment left lower lobe consistent with infection or aspiration. Cycle 2 gemcitabine and abraxane 01/21/2023, gemcitabine dose reduced due to neutropenia following cycle 1 Cycle 3 gemcitabine/Abraxane 02/04/2023 Cycle 4 gemcitabine/Abraxane 02/17/2023 Cycle 5 gemcitabine/Abraxane 03/04/2023 Cycle 6 gemcitabine/Abraxane 03/17/2023 CT abdomen/pelvis 03/28/2023-stable pancreas mass, multiple small hypoattenuating liver lesions (review of CTs at the GI tumor conference is most consistent with liver metastases-some smaller compared to the baseline CT) Cycle 7 gemcitabine/Abraxane 04/01/2023 Cycle 8 gemcitabine/Abraxane 04/15/2023 Cycle 9  gemcitabine/Abraxane 04/29/2023 Treatment  held per patient request 05/12/2023 Cycle 10 gemcitabine/Abraxane 05/27/2023 Cycle 11 gemcitabine/Abraxane 06/10/2023 Cycle 12 gemcitabine/Abraxane 06/24/2023 CTs 07/01/2023-no significant difference in size of the mass in the pancreatic head.  Redemonstration of multiple hypoattenuating liver lesions essentially unchanged and favored to represent simple cysts. 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, cholelithiasis and mild pericholecystic inflammatory stranding Plan for 5-1/2-week course of radiation plus concurrent Xeloda, start date 09/07/2023 Biliary obstruction secondary to 1-status post ERCP/stent placement 12/13/2022 Coronary artery disease Atrial fibrillation Gout Sleep apnea Status post bariatric surgery Hypertension Bronchiectasis Gastroesophageal reflux disease History of colon cancer in his 40s Complete heart block-pacemaker placed Admission 01/09/2023 with a fever and failure to thrive, CT chest with evidence of left lung pneumonia, CT abdomen/pelvis with a distended gallbladder and adjacent fat stranding Anemia 08/07/2023: Acute onset nausea/vomiting and abdominal pain-resolved after Zofran/morphine in the emergency room, CT without acute findings Depression - he has high score PHQ-9 today, no SI, plan, or access today. He declined referral to mental health provider. Has a trusting relationship with PCP  and agrees to call him soon to discuss his mental health  Admission 09/01/2023 with intractable nausea/vomiting and abdominal distention CT abdomen/pelvis-dilated stomach, wall thickening of the duodenum, cholelithiasis with inflammatory stranding surrounding gallbladder, common bile duct stent unchanged 09/03/2023-percutaneous cholecystostomy tube 09/05/2023-upper endoscopy: Congestion, friability in the duodenal bulb and duodenal sweep, severe stenosis within the pyloric channel/duodenal bulb/duodenal sweep, wall flex stent placed   Marcus [name] was admitted with intractable nausea/vomiting and marked abdominal distention.  A CT revealed a dilated stomach.  And an upper endoscopy confirmed a pyloric channel/duodenal stenosis.  He underwent stent placement yesterday.  I suspect the obstruction is related to progression of the locally advanced pancreas tumor. He appears much improved following placement of the stent.  He was scheduled to begin concurrent capecitabine and radiation this week.  Treatment has been placed on hold.  I will see him next week to determine the indication for proceeding with Xeloda and radiation. Recommendations: 1.  Diet per gastroenterology 2.  Management of the cholecystostomy tube per interventional radiology 3.  Outpatient follow-up will be scheduled at the Cancer center for the week of 09/12/2023 4.  Antibiotics per the medical service 5.  Please call oncology as needed   LOS: 5 days   Thornton Papas, MD   09/06/2023, 1:46 PM

## 2023-09-06 NOTE — Progress Notes (Addendum)
 PHARMACY - ANTICOAGULATION CONSULT NOTE  Pharmacy Consult for heparin  Indication: hx atrial fibrillation (PTA Eliquis on hold)  No Known Allergies  Patient Measurements: Height: 5\' 8"  (172.7 cm) Weight: 94.9 kg (209 lb 3.5 oz) IBW/kg (Calculated) : 68.4 Heparin Dosing Weight: 88 kg  Vital Signs: Temp: 98.2 F (36.8 C) (03/04 0543) Temp Source: Oral (03/04 0543) BP: 135/68 (03/04 0543) Pulse Rate: 59 (03/04 0543)  Labs: Recent Labs    09/04/23 0101 09/04/23 1041 09/05/23 0500 09/06/23 0340  HGB 9.1*  --  8.7* 8.9*  HCT 30.0*  --  29.0* 29.2*  PLT 145*  --  143* 139*  APTT 90* 94* 99* 63*  HEPARINUNFRC 0.97*  --  0.73* 0.48  CREATININE  --   --   --  1.16    Estimated Creatinine Clearance: 53.9 mL/min (by C-G formula based on SCr of 1.16 mg/dL).   Medical History: Past Medical History:  Diagnosis Date   Acute meniscal tear of knee left   Arthritis    generalized   AV block 08/01/2018   Benign essential hypertension 01/18/2007   Bronchiectasis with acute exacerbation 06/23/2020   Chest pain, atypical 08/23/2014   Chronic cough 10/07/2014   Coronary artery disease    Degeneration of lumbar intervertebral disc 10/14/2017   Diverticulosis of colon 07/07/2005   Elevated PSA 06/19/2014   GERD (gastroesophageal reflux disease) 01/18/2007   Gout, unspecified 01/18/2007   Hemorrhoids 01/19/2011   Hiatal hernia    History of colonic polyps 01/18/2007   Insomnia, unspecified 08/21/2007   Iron deficiency anemia, unspecified  01/19/2011   Lumbar post-laminectomy syndrome 10/14/2017   Lumbar spondylolysis    Lumbar strain 12/14/2011   Obstructive sleep apnea 01/18/2007   uses CPAP nightly   Paraesophageal hernia    large   Primary osteoarthritis of knee 05/23/2015   Prostate cancer (HCC) 2016   S/P knee replacement 05/23/2015   Sensorineural hearing loss, bilateral    Vitamin B12 deficiency    Vitamin D deficiency 10/28/2009    Medications:  - on Eliquis  5mg  bid PTA (last dose taken on 08/31/23)  Assessment: Patient is an 83 y.o M with pancreatic cancer, obstructive jaundice (s/p ERCP stent placement), and hx afib on Eliquis PTA who was instructed to come to the ED on 09/01/23 by his oncology team for evaluation of n/v and abdominal distention. Abdominal/pelvis CT on 09/01/23 showed findings with concern for acute cholecystitis, gastritis or obstruction, and stable pancreatic head mass. Eliquis held on admission and changed to heparin due to GOO and in case invasive intervention is needed.  Heparin held 3/3 AM for EGD and restarted 3/3 @2000 .  Today, 09/06/2023: aPTT is slightly subtherapeutic at 63 secs , heparin level is therapeutic at 0.48 (aPTT and heparin levels are not correlating due to residual effect of Eliquis). Will adjust heparin using aPTT until levels correlate, then use heparin levels alone for monitoring. Hgb stable at 8.9, plts stable at 139K No line issues or s/sx of bleeding reported   Goal of Therapy:  Heparin level 0.3-0.7 units/ml aPTT 66-102 seconds Monitor platelets by anticoagulation protocol: Yes   Plan:  Increase heparin drip rate to 1350 units/hr  Check aPTT 8hrs after rate change  Monitor CBC, aPTT, HL, and s/sx of bleeding daily F/u transition back to Eliquis   ADDENDUM: Patient now s/p upper GI endoscopy on 3/3. Per GI, okay to resume Eliquis today as patient has remained stable post-op. Plan: Stop heparin now Start Eliquis 5mg  BID  Continue to monitor for s/sx of bleeding    Cherylin Mylar, PharmD Clinical Pharmacist  3/4/20257:38 AM

## 2023-09-06 NOTE — Progress Notes (Addendum)
     [facility] Progress Note  CC: Gastric outlet obstruction, pancreatic adenocarcinoma  Subjective: Feels well.  Tolerated clear liquids without issues.  He is glad to be able to advance to full liquid diet today.  Having a little bit of pain at cholecystostomy tube site, but IR said that all looks well there.  Objective:  Vital signs in last 24 hours: Temp:  [97.4 F (36.3 C)-98.2 F (36.8 C)] 98.2 F (36.8 C) (03/04 0543) Pulse Rate:  [55-65] 59 (03/04 0543) Resp:  [13-21] 18 (03/04 0543) BP: (129-171)/(59-82) 135/68 (03/04 0543) SpO2:  [93 %-100 %] 96 % (03/04 0543) Weight:  [94.9 kg-96.9 kg] 94.9 kg (03/04 0500) Last BM Date : 09/04/23 General:  Alert, Well-developed, in NAD Heart:  Regular rate and rhythm; no murmurs Pulm: Clear to auscultation bilaterally.  No wheezes rales or rhonchi. Abdomen:  Soft, nondistended.  Bowel sounds present.  Nontender. Neurologic:  Alert and oriented x 4;  grossly normal neurologically. Psych:  Alert and cooperative. Normal mood and affect.  Intake/Output from previous day: 03/03 0701 - 03/04 0700 In: 1017.1 [P.O.:360; I.V.:366.7; IV Piggyback:285.3] Out: 1400 [Urine:850; Drains:550] Intake/Output this shift: Total I/O In: 240 [P.O.:240] Out: 340 [Drains:340]  Lab Results: Recent Labs    09/04/23 0101 09/05/23 0500 09/06/23 0340  WBC 7.6 4.5 4.6  HGB 9.1* 8.7* 8.9*  HCT 30.0* 29.0* 29.2*  PLT 145* 143* 139*   BMET Recent Labs    09/06/23 0340  NA 135  K 3.6  CL 104  CO2 23  GLUCOSE 108*  BUN 18  CREATININE 1.16  CALCIUM 7.9*   LFT Recent Labs    09/06/23 0340  PROT 5.5*  ALBUMIN 2.3*  AST 13*  ALT 12  ALKPHOS 59  BILITOT 0.6   Assessment / Plan: Pancreatic adenocarcinoma s/p ucSEMS exchange 8/5 (10mm x 6 cm) Gastric outlet obstruction Acute cholecystitis s/p cholecystostomy tube 3/1 A Fib, CHB s/p pacemaker (eliquis on hold): Is on IV heparin currently.  EGD 3/3 by Dr. Meridee Score: -  Mucosal changes in the duodenum bulb/ sweep leading to acquired duodenal stenosis. Biopsied. Prosthesis placed as above ( telescoped Wallflex x 2) . - Metal biliary stent at major papilla.  Biopsy results are benign with only nonspecific duodenitis consistent with peptic injury.  -We will advance to full liquid diet today.  Will see how he tolerates that and decide when we feel comfortable advancing to low residue diet and can have dietitian/nutritionist see him in regards to that.   LOS: 5 days   Princella Pellegrini. Zehr  09/06/2023, 10:54 AM     Attending physician's note   I have taken history, reviewed the chart and examined the patient. I performed a substantive portion of this encounter, including complete performance of at least one of the key components, in conjunction with the APP. I agree with the Advanced Practitioner's note, impression and recommendations.   Gastric outlet obstruction (Bx- neg for malignancy, likely d/t malignancy. D/d XRT) s/p enteral stent x 2 3/3 Pancreatic adenocarcinoma s/p ucSEMS exchange 8/5 (10mm x 6 cm) Acute cholecystitis s/p cholecystostomy tube 3/1 A Fib, CHB s/p pacemeker (eliquis on hold, on IV heparin)  Plan: -Advance to full liq diet today. Can advance to low residue diet in AM. -Dietary consult for same -Resume eliquis 3/5 -Dr Meridee Score will be back tomorrow.   Edman Circle, MD [facility] [phone]

## 2023-09-06 NOTE — Progress Notes (Signed)
 Physical Therapy Treatment Patient Details Name: Marcus [name] MRN: [id number] DOB: [birth year] Today's Date: 09/06/2023   History of Present Illness 83 yo male presents to therapy following hospital admission on 09/01/2023 due to reports of poor po intake with N and V and referred from OP oncology clinic to ED. Pt has hx of adenocarcinoma of pancreatic head and currently undergoing chemotherapy and noted to have new inflammation around the gallbladder and duodenitis as well as abdominal distension and stomach full of debris on imaging. Pt dx with acute cholecystitis. Pt PMH includes but is not limited to: CAD, A-fib, pacemaker, colon ca, HTN, DJD lumbar region s/p surgery, obstructive jaundice, GERD, gout, prostate ca, OSA on CPAP, anemia, HOH, gastric bypass, and L TKA.    PT Comments  PT - Cognition Comments: AxO x 3 pleasant and motivated.  Retired from [employer]" and originally from [place].  Lives with his Son and was Mod Indep prior to admit. Assisted with amb in hallway went well.  General transfer comment: self able to rise and lower with good use of hands to steady self.  Good safety cognition.  General Gait Details: lateral sway noted compounded by leg length discrepancy which pt describes as "normal".  Tolerated a functional distance with no c/o's.  "Feeling better", stated pt.  Used his personal cane as prior.  Pt plans to return home when medically stable.  LPT has rec HH PT.    If plan is discharge home, recommend the following: A little help with walking and/or transfers;A little help with bathing/dressing/bathroom;Assistance with cooking/housework;Assist for transportation;Help with stairs or ramp for entrance   Can travel by private vehicle        Equipment Recommendations  None recommended by PT    Recommendations for Other Services       Precautions / Restrictions Precautions Precautions: Fall Precaution/Restrictions Comments: leg length  discrepancy Restrictions Weight Bearing Restrictions Per Provider Order: No     Mobility  Bed Mobility               General bed mobility comments: OOB in recliner    Transfers Overall transfer level: Modified independent Equipment used: Straight cane               General transfer comment: self able to rise and lower with good use of hands to steady self.  Good safety cognition.    Ambulation/Gait Ambulation/Gait assistance: Supervision Gait Distance (Feet): 135 Feet Assistive device: Straight cane Gait Pattern/deviations: Step-through pattern, Decreased stance time - right, Wide base of support Gait velocity: decreased     General Gait Details: lateral sway noted compounded by leg length discrepancy which pt describes as "normal".  Tolerated a functional distance with no c/o's.  "Feeling better", stated pt.  Used his personal cane as prior.   Stairs             Wheelchair Mobility     Tilt Bed    Modified Rankin (Stroke Patients Only)       Balance                                            Communication Communication Communication: No apparent difficulties  Cognition   Behavior During Therapy: WFL for tasks assessed/performed   PT - Cognitive impairments: No apparent impairments  PT - Cognition Comments: AxO x 3 pleasant and motivated.  Retired from [employer]" and originally from [place].  Lives with his Son and was Mod Indep prior to admit. Following commands: Intact      Cueing    Exercises      General Comments        Pertinent Vitals/Pain Pain Assessment Pain Assessment: No/denies pain    Home Living                          Prior Function            PT Goals (current goals can now be found in the care plan section) Progress towards PT goals: Progressing toward goals    Frequency    Min 1X/week      PT Plan      Co-evaluation               AM-PAC PT "6 Clicks" Mobility   Outcome Measure  Help needed turning from your back to your side while in a flat bed without using bedrails?: None Help needed moving from lying on your back to sitting on the side of a flat bed without using bedrails?: None Help needed moving to and from a bed to a chair (including a wheelchair)?: None Help needed standing up from a chair using your arms (e.g., wheelchair or bedside chair)?: None Help needed to walk in hospital room?: None Help needed climbing 3-5 steps with a railing? : A Little 6 Click Score: 23    End of Session Equipment Utilized During Treatment: Gait belt Activity Tolerance: Patient tolerated treatment well;No increased pain Patient left: in chair;with call bell/phone within reach;with family/visitor present Nurse Communication: Mobility status PT Visit Diagnosis: Unsteadiness on feet (R26.81);Other abnormalities of gait and mobility (R26.89);Muscle weakness (generalized) (M62.81);History of falling (Z91.81);Difficulty in walking, not elsewhere classified (R26.2)     Time: 1610-9604 PT Time Calculation (min) (ACUTE ONLY): 10 min  Charges:    $Gait Training: 8-22 mins PT General Charges $$ ACUTE PT VISIT: 1 Visit                     Felecia Shelling  PTA Acute  Rehabilitation Services Office M-F          [phone]

## 2023-09-06 NOTE — Progress Notes (Signed)
 PROGRESS NOTE  Marcus [name] ZOX:[id number] DOB: 13-Mar-1941 DOA: 09/01/2023 PCP: [name], NP   LOS: 5 days   Brief Narrative / Interim history: 83 year old male with pancreatic cancer, obstructive jaundice with history of ERCP and stent placement, HTN, CAD, OSA, currently getting chemo comes into the hospital with few days of abdominal pain, nausea, decreased p.o. intake.  Imaging in the ED with a CT scan of the abdomen and pelvis showed cholelithiasis with inflammatory stranding around the gallbladder, also dilated stomach with inflammatory stranding around the duodenal area, could not exclude tumor involvement.  CBD stent was in place.  General surgery was consulted and he was admitted to the hospital.  HIDA scan from cholecystitis and underwent percutaneous cholecystostomy tube placement.  GI consulted, underwent an EGD 3/3 status post duodenal stent placement  Subjective / 24h Interval events: He feels much better today after NG tube removal yesterday.  He is trying some liquids today but tells me he wants to see how he does with full diet  Assesement and Plan: Principal Problem:   Acute cholecystitis Active Problems:   Atrial fibrillation, chronic (HCC)   Pancreatic adenocarcinoma (HCC)   History of biliary duct stent placement   GERD (gastroesophageal reflux disease)   Cardiac pacemaker in situ   Biliary obstruction   Gastric outlet obstruction   Malignant neoplasm of head of pancreas (HCC)   Abnormal CT of the abdomen   Principal problem Concern for gastric outlet obstruction -gastroenterology consulted, patient underwent an endoscopy on 3/3 which found gastric outlet obstruction and had a duodenal stent placed -Currently on clear liquid, slowly advance per gastroenterology -NG tube now removed  Active problems Cholelithiasis, concern for acute cholecystitis-General Surgery consulted and following.  HIDA scan was positive for acute cholecystitis on 2/28, he is now  status post percutaneous cholecystostomy 3/1. -Doing well following the procedure, has some soreness at the site  Pancreatic cancer-receiving treatment as an outpatient.  Hold Xeloda now.  Oncology following  Pancytopenia-in the setting of malignancy, chemotherapy, counts are overall stable  History of CAD, CHB status post pacemaker, chronic A-fib -Eliquis on hold, currently on heparin infusion.  There is no evidence of bleeding, hemoglobin is stable, defer to gastroenterology when Eliquis can be resumed  OSA-continue CPAP  Bronchiectasis-stable, no symptoms  Obesity, class I-BMI 33  Scheduled Meds:  Chlorhexidine Gluconate Cloth  6 each Topical Daily   pantoprazole (PROTONIX) IV  40 mg Intravenous Q12H   sodium chloride flush  10-40 mL Intracatheter Q12H   sodium chloride flush  3 mL Intravenous Q12H   sodium chloride flush  5 mL Intracatheter Q8H   Continuous Infusions:  heparin 1,350 Units/hr (09/06/23 0745)   piperacillin-tazobactam (ZOSYN)  IV 3.375 g (09/06/23 0520)   PRN Meds:.acetaminophen **OR** acetaminophen, albuterol, bisacodyl, hydrALAZINE, menthol-cetylpyridinium, ondansetron **OR** ondansetron (ZOFRAN) IV, oxyCODONE, phenol, polyethylene glycol, sodium chloride flush, white petrolatum  Current Outpatient Medications  Medication Instructions   acetaminophen (TYLENOL) 650 mg, Oral, Every 6 hours PRN   allopurinol (ZYLOPRIM) 300 mg, Oral, Daily   amLODipine (NORVASC) 10 mg, Oral, Daily   bisacodyl (DULCOLAX) 10 mg, Oral, Daily PRN   Calcium Carb-Cholecalciferol (CALCIUM 600 + D PO) 1 tablet, Daily   capecitabine (XELODA) 1,500 mg, Oral, 2 times daily after meals, Take Monday-Friday. Take only days of radiation.   Carboxymethylcellulose Sod PF 0.5 % SOLN 1 drop, Ophthalmic, 4 times daily   cyanocobalamin (VITAMIN B12) 500 MCG tablet 1 tablet, Oral, 2 times daily   docusate sodium (COLACE)  100 mg, 2 times daily   Eliquis 5 mg, Oral, 2 times daily   Eszopiclone 3 mg,  Oral, Daily at bedtime, Take immediately before bedtime   fluticasone (FLONASE) 50 MCG/ACT nasal spray 2 sprays, Each Nare, Daily   glycerin adult 2 g suppository 1 suppository, Rectal, As needed   ipratropium (ATROVENT) 0.03 % nasal spray 2 sprays, Each Nare, Every 12 hours   lidocaine-prilocaine (EMLA) cream 1 Application, Topical, As needed   mirabegron ER (MYRBETRIQ) 50 MG TB24 tablet 1 tablet, Oral, Daily   mirtazapine (REMERON) 15 mg, Oral, Daily at bedtime   ondansetron (ZOFRAN) 4 mg, Oral, Every 4 hours PRN   oxybutynin (DITROPAN) 5 mg, Oral, 2 times daily PRN   oxyCODONE (ROXICODONE) 5 mg, Oral, Every 6 hours PRN   polyethylene glycol powder (GLYCOLAX/MIRALAX) 17 GM/SCOOP powder Mix 4 capfuls in a 32 oz Gatorade, Can repeat the next day for a satisfactory bowel movement. THEN one capful daily for maintenance   Potassium Gluconate 550 mg, Oral, Daily   Respiratory Therapy Supplies (FLUTTER) DEVI 1 each, Does not apply, 2 times daily   rosuvastatin (CRESTOR) 20 mg, Oral, Daily   Sodium Chloride Flush (NORMAL SALINE FLUSH) 0.9 % SOLN 10 mLs, Intravenous, Daily   sodium chloride HYPERTONIC 3 % nebulizer solution Nebulization, As needed   tamsulosin (FLOMAX) 0.4 MG CAPS capsule 1 capsule, Oral, Daily   traZODone (DESYREL) 50 MG tablet 1 tablet, Oral, At bedtime PRN   valsartan-hydrochlorothiazide (DIOVAN-HCT) 160-25 MG tablet 1 tablet, Daily    Diet Orders (From admission, onward)     Start     Ordered   09/05/23 1531  Diet clear liquid Fluid consistency: Thin  Diet effective now       Question:  Fluid consistency:  Answer:  Thin   09/05/23 1530            DVT prophylaxis: SCDs Start: 09/01/23 1934   Lab Results  Component Value Date   PLT 139 (L) 09/06/2023      Code Status: Limited: Do not attempt resuscitation (DNR) -DNR-LIMITED -Do Not Intubate/DNI   Family Communication: No family at bedside  Status is: Inpatient Remains inpatient appropriate because: Severity  of illness   Level of care: Med-Surg  Consultants:  General surgery Gastroenterology Oncology  Objective: Vitals:   09/05/23 1539 09/05/23 2207 09/06/23 0500 09/06/23 0543  BP: (!) 162/80 129/76  135/68  Pulse: (!) 58 61  (!) 59  Resp: 18 17  18   Temp: (!) 97.4 F (36.3 C) 98 F (36.7 C)  98.2 F (36.8 C)  TempSrc: Oral Oral  Oral  SpO2: 98% 96%  96%  Weight:   94.9 kg   Height:        Intake/Output Summary (Last 24 hours) at 09/06/2023 1118 Last data filed at 09/06/2023 0959 Gross per 24 hour  Intake 1257.05 ml  Output 1540 ml  Net -282.95 ml   Wt Readings from Last 3 Encounters:  09/06/23 94.9 kg  09/01/23 98.6 kg  08/25/23 98 kg    Examination:  Constitutional: NAD Eyes: lids and conjunctivae normal, no scleral icterus ENMT: mmm Neck: normal, supple Respiratory: clear to auscultation bilaterally, no wheezing, no crackles. Normal respiratory effort.  Cardiovascular: Regular rate and rhythm, no murmurs / rubs / gallops. No LE edema. Abdomen: soft, no distention, no tenderness. Bowel sounds positive.    Data Reviewed: I have independently reviewed following labs and imaging studies   CBC Recent Labs  Lab 09/01/23 1032 09/01/23  1032 09/01/23 1447 09/01/23 1454 09/02/23 0215 09/03/23 0404 09/04/23 0101 09/05/23 0500 09/06/23 0340  WBC 3.8*   < > 3.6*  --  3.1* 3.6* 7.6 4.5 4.6  HGB 9.7*  --  9.3*   < > 9.0* 9.2* 9.1* 8.7* 8.9*  HCT 30.3*  --  30.2*   < > 29.4* 30.2* 30.0* 29.0* 29.2*  PLT 174   < > 150  --  138* 149* 145* 143* 139*  MCV 85.6  --  87.5  --  89.9 89.9 89.3 89.8 89.3  MCH 27.4  --  27.0  --  27.5 27.4 27.1 26.9 27.2  MCHC 32.0  --  30.8  --  30.6 30.5 30.3 30.0 30.5  RDW 16.7*  --  16.7*  --  16.7* 16.9* 17.0* 17.1* 16.9*  LYMPHSABS 0.7  --  0.6*  --   --   --   --   --   --   MONOABS 0.5  --  0.4  --   --   --   --   --   --   EOSABS 0.1  --  0.1  --   --   --   --   --   --   BASOSABS 0.0  --  0.0  --   --   --   --   --   --    <  > = values in this interval not displayed.    Recent Labs  Lab 09/01/23 1032 09/01/23 1447 09/01/23 1454 09/02/23 0215 09/02/23 1747 09/03/23 0404 09/06/23 0340  NA 137 134* 138 135  --  138 135  K 3.8 3.6 3.7 4.0  --  3.6 3.6  CL 100 100 101 105  --  107 104  CO2 29 26  --  24  --  22 23  GLUCOSE 125* 111* 105* 142*  --  105* 108*  BUN 25* 24* 22 21  --  16 18  CREATININE 1.11 1.11 1.20 1.14  --  1.02 1.16  CALCIUM 9.0 8.7*  --  8.6*  --  8.8* 7.9*  AST 19 22  --  20  --  18 13*  ALT 15 19  --  17  --  16 12  ALKPHOS 96 94  --  92  --  85 59  BILITOT 0.4 0.4  --  0.6  --  0.7 0.6  ALBUMIN 3.6 3.1*  --  2.8*  --  2.8* 2.3*  MG  --   --   --  2.1  --  2.1 2.0  INR  --   --   --   --  1.4*  --   --     ------------------------------------------------------------------------------------------------------------------ No results for input(s): "CHOL", "HDL", "LDLCALC", "TRIG", "CHOLHDL", "LDLDIRECT" in the last 72 hours.  Lab Results  Component Value Date   HGBA1C 6.3 11/24/2022   ------------------------------------------------------------------------------------------------------------------ No results for input(s): "TSH", "T4TOTAL", "T3FREE", "THYROIDAB" in the last 72 hours.  Invalid input(s): "FREET3"  Cardiac Enzymes No results for input(s): "CKMB", "TROPONINI", "MYOGLOBIN" in the last 168 hours.  Invalid input(s): "CK" ------------------------------------------------------------------------------------------------------------------    Component Value Date/Time   BNP 60.0 02/28/2023 0030    CBG: No results for input(s): "GLUCAP" in the last 168 hours.  Recent Results (from the past 240 hours)  Aerobic/Anaerobic Culture w Gram Stain (surgical/deep wound)     Status: None (Preliminary result)   Collection Time: 09/03/23  8:20 AM   Specimen: Abscess  Result Value Ref Range Status   Specimen Description ABSCESS  Final   Special Requests GALL BLADDER  Final    Gram Stain   Final    RARE WBC PRESENT, PREDOMINANTLY PMN ABUNDANT GRAM POSITIVE RODS ABUNDANT GRAM NEGATIVE RODS ABUNDANT GRAM POSITIVE COCCI Performed at [facility], [address] [place], [place] [address]    Culture   Final    ABUNDANT KLEBSIELLA OXYTOCA ABUNDANT STREPTOCOCCUS MITIS/ORALIS ABUNDANT ENTEROCOCCUS FAECALIS NO ANAEROBES ISOLATED; CULTURE IN PROGRESS FOR 5 DAYS    Report Status PENDING  Incomplete   Organism ID, Bacteria KLEBSIELLA OXYTOCA  Final   Organism ID, Bacteria STREPTOCOCCUS MITIS/ORALIS  Final   Organism ID, Bacteria ENTEROCOCCUS FAECALIS  Final      Susceptibility   Enterococcus faecalis - MIC*    AMPICILLIN <=2 SENSITIVE Sensitive     VANCOMYCIN 1 SENSITIVE Sensitive     GENTAMICIN SYNERGY SENSITIVE Sensitive     * ABUNDANT ENTEROCOCCUS FAECALIS   Klebsiella oxytoca - MIC*    AMPICILLIN >=32 RESISTANT Resistant     CEFEPIME <=0.12 SENSITIVE Sensitive     CEFTAZIDIME <=1 SENSITIVE Sensitive     CEFTRIAXONE <=0.25 SENSITIVE Sensitive     CIPROFLOXACIN <=0.25 SENSITIVE Sensitive     GENTAMICIN <=1 SENSITIVE Sensitive     IMIPENEM <=0.25 SENSITIVE Sensitive     TRIMETH/SULFA <=20 SENSITIVE Sensitive     AMPICILLIN/SULBACTAM 8 SENSITIVE Sensitive     PIP/TAZO <=4 SENSITIVE Sensitive ug/mL    * ABUNDANT KLEBSIELLA OXYTOCA   Streptococcus mitis/oralis - MIC*    PENICILLIN <=0.06 SENSITIVE Sensitive     CEFTRIAXONE <=0.12 SENSITIVE Sensitive     LEVOFLOXACIN 0.5 SENSITIVE Sensitive     VANCOMYCIN 0.5 SENSITIVE Sensitive     * ABUNDANT STREPTOCOCCUS MITIS/ORALIS     Radiology Studies: DG C-Arm 1-60 Min-No Report Result Date: 09/05/2023 Fluoroscopy was utilized by the requesting physician.  No radiographic interpretation.      Pamella Pert, MD, PhD [facility]  Between 7 am - 7 pm I am available, please contact me via Amion (for emergencies) or Securechat (non urgent messages)  Between 7 pm - 7 am I am not available, please  contact night coverage MD/APP via Amion

## 2023-09-06 NOTE — Progress Notes (Signed)
 Referring Physician(s): Simaan,E,PA-C  Supervising Physician: Marliss Coots  Patient Status:  [name] - In-pt  Chief Complaint: pancreatic adenocarcinoma; cholecystitis; abd pain; biliary obstruction ; s/p image guided percutaneous cholecystostomy placement 09/03/23   Subjective: Pt sitting up in chair; doing fairly well; does have some mild RUQ discomfort; denies fever,N/V   Allergies: Patient has no known allergies.  Medications: Prior to Admission medications   Medication Sig Start Date End Date Taking? Authorizing Provider  acetaminophen (TYLENOL) 325 MG tablet Take 2 tablets (650 mg total) by mouth every 6 (six) hours as needed for mild pain (or Fever >/= 101). 01/13/23  Yes Sheikh, Omair Latif, DO  allopurinol (ZYLOPRIM) 300 MG tablet Take 300 mg by mouth daily.   Yes [provider]  amLODipine (NORVASC) 10 MG tablet Take 10 mg by mouth daily.   Yes [provider]  apixaban (ELIQUIS) 5 MG TABS tablet Take 1 tablet (5 mg total) by mouth 2 (two) times daily. 02/23/23 09/01/23 Yes Nafziger, Kandee Keen, NP  bisacodyl (DULCOLAX) 5 MG EC tablet Take 10 mg by mouth daily as needed for moderate constipation.   Yes [provider]  capecitabine (XELODA) 500 MG tablet Take 3 tablets (1,500 mg total) by mouth 2 (two) times daily after a meal. Take Monday-Friday. Take only days of radiation. 08/25/23  Yes [name], MD  Carboxymethylcellulose Sod PF 0.5 % SOLN Apply 1 drop to eye in the morning, at noon, in the evening, and at bedtime.   Yes [provider]  cyanocobalamin (VITAMIN B12) 500 MCG tablet Take 1 tablet by mouth 2 (two) times daily. 07/26/23  Yes [provider]  fluticasone (FLONASE) 50 MCG/ACT nasal spray Place 2 sprays into both nostrils daily.   Yes [provider]  glycerin adult 2 g suppository Place 1 suppository rectally as needed for constipation. 08/14/23  Yes Ernie Avena, MD  ipratropium (ATROVENT) 0.03 % nasal spray  Place 2 sprays into both nostrils every 12 (twelve) hours. Patient taking differently: Place 2 sprays into both nostrils 2 (two) times daily as needed for rhinitis. 05/03/22  Yes   lidocaine-prilocaine (EMLA) cream Apply 1 Application topically as needed. 12/31/22  Yes [name], MD  mirabegron ER (MYRBETRIQ) 50 MG TB24 tablet Take 1 tablet by mouth daily. 06/21/23  Yes [provider]  oxybutynin (DITROPAN) 5 MG tablet Take 5 mg by mouth 2 (two) times daily as needed (urinary incontinence).   Yes [provider]  polyethylene glycol powder (GLYCOLAX/MIRALAX) 17 GM/SCOOP powder Mix 4 capfuls in a 32 oz Gatorade, Can repeat the next day for a satisfactory bowel movement. THEN one capful daily for maintenance 08/14/23  Yes Ernie Avena, MD  Potassium Gluconate 550 MG TABS Take 550 mg by mouth daily.   Yes [provider]  rosuvastatin (CRESTOR) 20 MG tablet Take 20 mg by mouth daily.   Yes [provider]  Sodium Chloride Flush (NORMAL SALINE FLUSH) 0.9 % SOLN Inject 10 mLs into the vein daily. 09/04/23  Yes [name], NP  sodium chloride HYPERTONIC 3 % nebulizer solution Take by nebulization as needed for other. 04/01/22  Yes [name], NP  tamsulosin (FLOMAX) 0.4 MG CAPS capsule Take 1 capsule by mouth daily. 06/21/23  Yes [provider]  traZODone (DESYREL) 50 MG tablet Take 1 tablet by mouth at bedtime as needed for sleep. 07/26/23  Yes [provider]  Calcium Carb-Cholecalciferol (CALCIUM 600 + D PO) Take 1 tablet by mouth  daily. 800 units of vitamin D Patient not taking: Reported on 09/01/2023    [provider]  docusate sodium (COLACE) 100 MG capsule Take 100 mg by mouth 2 (two) times daily. Patient not taking: Reported on 09/01/2023    [provider]  Eszopiclone 3 MG TABS Take 1 tablet (3 mg total) by mouth at bedtime. Take immediately before bedtime Patient not taking: Reported on 09/01/2023  06/14/23   [name], NP  mirtazapine (REMERON) 15 MG tablet Take 1 tablet (15 mg total) by mouth at bedtime. Patient not taking: Reported on 09/01/2023 06/10/23   [name], MD  ondansetron (ZOFRAN) 4 MG tablet Take 1 tablet (4 mg total) by mouth every 4 (four) hours as needed for nausea or vomiting. Patient not taking: Reported on 09/01/2023 08/10/23   [name], NP  oxyCODONE (ROXICODONE) 5 MG immediate release tablet Take 1 tablet (5 mg total) by mouth every 6 (six) hours as needed for severe pain (pain score 7-10). Patient not taking: Reported on 09/01/2023 08/18/23   [name], NP  Respiratory Therapy Supplies (FLUTTER) DEVI 1 each by Does not apply route in the morning and at bedtime. 10/11/19   [name], DO  valsartan-hydrochlorothiazide (DIOVAN-HCT) 160-25 MG tablet Take 1 tablet by mouth daily. Patient not taking: Reported on 09/01/2023    [provider]     Vital Signs: BP 135/68 (BP Location: Right Arm)   Pulse (!) 59   Temp 98.2 F (36.8 C) (Oral)   Resp 18   Ht 5\' 8"  (1.727 m)   Wt 209 lb 3.5 oz (94.9 kg)   SpO2 96%   BMI 31.81 kg/m   Physical Exam: awake/alert; GB drain intact, insertion site ok, mildly tender to palpation; OP 340 cc, drain flushes without difficulty, no hemobilia  Imaging: DG C-Arm 1-60 Min-No Report Result Date: 09/05/2023 Fluoroscopy was utilized by the requesting physician.  No radiographic interpretation.   IR Perc Cholecystostomy Result Date: 09/03/2023 INDICATION: 83 year old male with history of pancreatic adenocarcinoma and common bile duct stent placement with clinical and imaging findings of acute cholecystitis. EXAM: Ultrasound and fluoroscopic guided percutaneous cholecystostomy tube placement. MEDICATIONS: The patient was currently receiving intravenous antibiotics as an inpatient, no additional antibiotics were administered. ANESTHESIA/SEDATION: Moderate (conscious) sedation was employed during this procedure. A  total of Versed 2 mg and Fentanyl 100 mcg was administered intravenously. Moderate Sedation Time: 10 minutes. The patient's level of consciousness and vital signs were monitored continuously by radiology nursing throughout the procedure under my direct supervision. FLUOROSCOPY TIME:  Nineteen mGy COMPLICATIONS: None immediate. PROCEDURE: Informed written consent was obtained from the patient after a thorough discussion of the procedural risks, benefits and alternatives. All questions were addressed. Maximal Sterile Barrier Technique was utilized including caps, mask, sterile gowns, sterile gloves, sterile drape, hand hygiene and skin antiseptic. A timeout was performed prior to the initiation of the procedure. The patient was placed supine on the angiographic table. The patient's right upper quadrant was then prepped and draped in normal sterile fashion with maximum sterile barrier. Ultrasound demonstrates a distended gallbladder. Subdermal Local anesthesia was provided at the planned skin entry site. Under ultrasound guidance, deeper local anesthetic was provided through intercostal muscles and along the liver capsule. Ultrasound was used to puncture the gallbladder using a 21 gauge Chiba needle via a trans peritoneal approach with visualization of the lung treated to the gallbladder. A 0.018 inch wire was advanced into the lumen and a transition dilator placed. A  gentle hand injection of contrast was performed. Cholecystogram demonstrates obstruction of the cystic duct. A 0.035 inch exchange wire was placed in the tract was dilated. A 10 French multipurpose drainage catheter was advanced into the gallbladder lumen. The drain was then secured in place using a 0-silk suture and a Stayfix device. A sterile dressing was applied. The tube was placed to bag drainage. A culture was sent to the lab for analysis. The patient tolerated procedure well without evidence of immediate complication was transferred back to the  floor in stable condition. IMPRESSION: Successful placement of 10 French percutaneous, trans peritoneal cholecystostomy tube. Marliss Coots, MD [facility] Electronically Signed   By: Marliss Coots M.D.   On: 09/03/2023 09:25   DG INTRO LONG GI TUBE Result Date: 09/02/2023 CLINICAL DATA:  Gastric outlet obstruction. EXAM: FL FEEDING TUBE PLACEMENT CONTRAST:  0 cc FLUOROSCOPY: Fluoroscopy Time:  1 minute Radiation Exposure Index (if provided by the fluoroscopic device): 18 Number of Acquired Spot Images: 0 COMPARISON:  CT 03/31/2024 FINDINGS: NG tube advanced into the distal stomach. Tube passes multiple surgical clips within the stomach suggesting passage through gastrostomy. IMPRESSION: NG tube advanced into distal stomach. Electronically Signed   By: Genevive Bi M.D.   On: 09/02/2023 16:54   NM Hepatobiliary Liver Func Result Date: 09/02/2023 CLINICAL DATA:  Concern for cholecystitis. Gallbladder inflammation on CT EXAM: NUCLEAR MEDICINE HEPATOBILIARY IMAGING TECHNIQUE: Sequential images of the abdomen were obtained out to 60 minutes following intravenous administration of radiopharmaceutical. RADIOPHARMACEUTICALS:  5.2 mCi Tc-26m  Choletec IV COMPARISON:  CT 09/01/2023 FINDINGS: Prompt clearance radiotracer from blood pool and homogeneous uptake liver. Counts are evident in the small bowel by 20 minutes. Gallbladder fails to fill at 60 minutes. 3 mg IV morphine was administered to augment filling of the gallbladder. Gallbladder failed to fill following morphine augmentation. IMPRESSION: 1. Non filling the gallbladder is concerning for acute cholecystitis. Of note, patient has not eaten in days which can produce false positive findings. 2. Patent common bile duct. These results will be called to the ordering clinician or representative by the Radiologist Assistant, and communication documented in the PACS or [employer]. Electronically  Signed   By: Genevive Bi M.D.   On: 09/02/2023 15:35    Labs:  CBC: Recent Labs    09/03/23 0404 09/04/23 0101 09/05/23 0500 09/06/23 0340  WBC 3.6* 7.6 4.5 4.6  HGB 9.2* 9.1* 8.7* 8.9*  HCT 30.2* 30.0* 29.0* 29.2*  PLT 149* 145* 143* 139*    COAGS: Recent Labs    12/13/22 1727 12/14/22 0037 01/09/23 1635 09/01/23 2120 09/02/23 1747 09/03/23 0404 09/04/23 0101 09/04/23 1041 09/05/23 0500 09/06/23 0340  INR 1.6* 1.4* 1.3*  --  1.4*  --   --   --   --   --   APTT  --   --   --    < >  --    < > 90* 94* 99* 63*   < > = values in this interval not displayed.    BMP: Recent Labs    09/01/23 1447 09/01/23 1454 09/02/23 0215 09/03/23 0404 09/06/23 0340  NA 134* 138 135 138 135  K 3.6 3.7 4.0 3.6 3.6  CL 100 101 105 107 104  CO2 26  --  24 22 23   GLUCOSE 111* 105* 142* 105* 108*  BUN 24* 22 21 16 18   CALCIUM 8.7*  --  8.6* 8.8* 7.9*  CREATININE 1.11 1.20 1.14  1.02 1.16  GFRNONAA >60  --  >60 >60 >60    LIVER FUNCTION TESTS: Recent Labs    09/01/23 1447 09/02/23 0215 09/03/23 0404 09/06/23 0340  BILITOT 0.4 0.6 0.7 0.6  AST 22 20 18  13*  ALT 19 17 16 12   ALKPHOS 94 92 85 59  PROT 6.3* 5.9* 6.0* 5.5*  ALBUMIN 3.1* 2.8* 2.8* 2.3*    Assessment and Plan: Pt with hx pancreatic adenocarcinoma, gastric outlet obstruction,duodenitis, cholecystitis, portal vein occlusion, afib, CHB s/p pacer; s/p perc GB drain 09/03/23; s/p EGD 3/3-mucosal changes in the duodenum bulb/ sweep leading to acquired duodenal stenosis. Biopsied. Prosthesis placed as above ( telescoped Wallflex x 2) . - Metal biliary stent at major papilla. Biopsy results are benign with only nonspecific duodenitis consistent with peptic injury; afebrile, creat nl, t bili nl, WBC nl, hgb stable; bile cx- klebsiella, strept, enterococcus   Output by Drain (mL) 09/04/23 0701 - 09/04/23 1900 09/04/23 1901 - 09/05/23 0700 09/05/23 0701 - 09/05/23 1900 09/05/23 1901 - 09/06/23 0700 09/06/23 0701 -  09/06/23 1308  Biliary Tube Cholecystostomy 10 Fr. RUQ 300 150 400 150 340   Continue with GB drain saline irrigation, close OP monitoring, lab checks; drain will need to remain in place at least 4-6 weeks unless GB removed surgically; pt has f/u cholangi[facility] scheduled for 10/24/23 at [facility]   Electronically Signed: D. Jeananne Rama, PA-C 09/06/2023, 12:59 PM   I spent a total of 15 Minutes at the the patient's bedside AND on the patient's hospital floor or unit, greater than 50% of which was counseling/coordinating care for gallbladder drain    Patient ID: Marcus [name], male   DOB: May 11, 1941, 83 y.o.   MRN: [id number]

## 2023-09-07 ENCOUNTER — Ambulatory Visit: Payer: Non-veteran care

## 2023-09-07 ENCOUNTER — Other Ambulatory Visit (HOSPITAL_COMMUNITY): Payer: Self-pay

## 2023-09-07 ENCOUNTER — Encounter (HOSPITAL_COMMUNITY): Payer: Self-pay | Admitting: Gastroenterology

## 2023-09-07 DIAGNOSIS — I482 Chronic atrial fibrillation, unspecified: Secondary | ICD-10-CM | POA: Diagnosis not present

## 2023-09-07 DIAGNOSIS — K831 Obstruction of bile duct: Secondary | ICD-10-CM

## 2023-09-07 DIAGNOSIS — Z9889 Other specified postprocedural states: Secondary | ICD-10-CM

## 2023-09-07 DIAGNOSIS — C259 Malignant neoplasm of pancreas, unspecified: Secondary | ICD-10-CM

## 2023-09-07 DIAGNOSIS — K311 Adult hypertrophic pyloric stenosis: Secondary | ICD-10-CM

## 2023-09-07 DIAGNOSIS — K810 Acute cholecystitis: Secondary | ICD-10-CM | POA: Diagnosis not present

## 2023-09-07 LAB — COMPREHENSIVE METABOLIC PANEL
ALT: 11 U/L (ref 0–44)
AST: 11 U/L — ABNORMAL LOW (ref 15–41)
Albumin: 2.2 g/dL — ABNORMAL LOW (ref 3.5–5.0)
Alkaline Phosphatase: 55 U/L (ref 38–126)
Anion gap: 7 (ref 5–15)
BUN: 15 mg/dL (ref 8–23)
CO2: 23 mmol/L (ref 22–32)
Calcium: 7.8 mg/dL — ABNORMAL LOW (ref 8.9–10.3)
Chloride: 105 mmol/L (ref 98–111)
Creatinine, Ser: 1.18 mg/dL (ref 0.61–1.24)
GFR, Estimated: >60 mL/min (ref >60)
Glucose, Bld: 110 mg/dL — ABNORMAL HIGH (ref 70–99)
Potassium: 3.4 mmol/L — ABNORMAL LOW (ref 3.5–5.1)
Sodium: 135 mmol/L (ref 135–145)
Total Bilirubin: 0.6 mg/dL (ref 0.0–1.2)
Total Protein: 5.2 g/dL — ABNORMAL LOW (ref 6.5–8.1)

## 2023-09-07 LAB — CBC
HCT: 28.8 % — ABNORMAL LOW (ref 39.0–52.0)
Hemoglobin: 8.8 g/dL — ABNORMAL LOW (ref 13.0–17.0)
MCH: 26.7 pg (ref 26.0–34.0)
MCHC: 30.6 g/dL (ref 30.0–36.0)
MCV: 87.5 fL (ref 80.0–100.0)
Platelets: 154 10*3/uL (ref 150–400)
RBC: 3.29 MIL/uL — ABNORMAL LOW (ref 4.22–5.81)
RDW: 17 % — ABNORMAL HIGH (ref 11.5–15.5)
WBC: 3.6 10*3/uL — ABNORMAL LOW (ref 4.0–10.5)
nRBC: 0 % (ref 0.0–0.2)

## 2023-09-07 LAB — PHOSPHORUS: Phosphorus: 2.7 mg/dL (ref 2.5–4.6)

## 2023-09-07 LAB — MAGNESIUM: Magnesium: 2 mg/dL (ref 1.7–2.4)

## 2023-09-07 MED ORDER — ENSURE ENLIVE PO LIQD: 237.0000 mL | Freq: Two times a day (BID) | ORAL | Status: DC

## 2023-09-07 MED ORDER — HEPARIN SOD (PORK) LOCK FLUSH 100 UNIT/ML IV SOLN
500.0000 [IU] | INTRAVENOUS | Status: AC | PRN
  Administered 2023-09-07: 500 [IU]

## 2023-09-07 MED ORDER — POTASSIUM CHLORIDE CRYS ER 20 MEQ PO TBCR
40.0000 meq | EXTENDED_RELEASE_TABLET | Freq: Once | ORAL | Status: AC
  Administered 2023-09-07: 40 meq via ORAL
  Filled 2023-09-07: qty 2

## 2023-09-07 NOTE — Progress Notes (Addendum)
 Pt is potentially be DC today with biliary drain. This RN educate the pt how to drain the bag, dressing changed and flushing of the drain. Son was present in the entire education process and was included in the teaching. All questions has been answered. Supplies provided to pt and family.

## 2023-09-07 NOTE — Progress Notes (Signed)
 Nutrition Education Note  RD consulted for nutrition education regarding low residue diet.  Patient reports a UBW of 250# and a 45# weight loss over the past year due to pancreatic cancer. Per EMR, patient lost 28# or 12% from March - October 2024. This is significant for a time frame of 7 months. However, weight has been mostly stable since October. Patient endorses eating 1 meal a day with several snacks. Drinking a Boost/Ensure once a day/every other day.  He reports the diet here has been "awful" as he has been limited and doesn't like the food choices.  RD provided "Fiber Restricted Nutrition Therapy" handout from the Academy of Nutrition and Dietetics.  Discussed general tips for eating/meals and discussed low fiber foods list and foods that are higher in fiber and not recommended at this time. Explained reasons to follow Low Fiber diet.  Pt verbalizes understanding of information provided. Expect fair compliance.  Current diet order is Soft, no % intake of meals documented since diet advanced. Labs and medications reviewed.  Patient to discharge home today.   Shelle Iron RD, LDN Contact via [employer].

## 2023-09-07 NOTE — TOC Transition Note (Signed)
 Transition of Care [facility]) - Discharge Note   Patient Details  Name: [place] WENZLICK MRN: [id number] Date of Birth: 07/10/1940  [facility] of [facility]) CM/SW Contact:  Amada Jupiter, LCSW Phone Number: 09/07/2023, 1:46 PM   Clinical Narrative:     Met with pt and son with anticipation of being dc today.  Pt is agreeable to recommendation for HHPT follow up and no agency preference.  Able to secure [facility] with [facility] and agency contact info placed on AVS.  No further TOC needs.  Final next level of care: Home w Home Health Services Barriers to Discharge: Barriers Resolved   Patient Goals and CMS Choice Patient states their goals for this hospitalization and ongoing recovery are:: home CMS Medicare.gov Compare Post Acute Care list provided to:: Patient Represenative (must comment) Jomarie Longs D. [place] (son))        Discharge Placement                       Discharge Plan and Services Additional resources added to the After Visit Summary for     Discharge Planning Services: CM Consult            DME Arranged: N/A DME Agency: NA       HH Arranged: PT HH Agency: [facility] Date [facility] Agency Contacted: 09/07/23 Time HH Agency Contacted: [phone] Representative spoke with at [facility] Agency: Amy Hyatt  Social Drivers of Health (SDOH) Interventions SDOH Screenings   Food Insecurity: No Food Insecurity (09/04/2023)  Housing: Low Risk  (09/04/2023)  Transportation Needs: No Transportation Needs (09/04/2023)  Utilities: Not At Risk (09/04/2023)  Alcohol Screen: Low Risk  (08/22/2023)  Depression (PHQ2-9): High Risk (09/01/2023)  Financial Resource Strain: Low Risk  (07/28/2021)  Physical Activity: Inactive (07/28/2021)  Social Connections: Moderately Isolated (09/01/2023)  Stress: No Stress Concern Present (07/28/2021)  Tobacco Use: Medium Risk (09/05/2023)     Readmission Risk Interventions    09/04/2023    4:09 PM 01/11/2023    5:08 PM  Readmission Risk Prevention Plan   Transportation Screening Complete Complete  Medication Review (RN Care Manager) Complete Referral to Pharmacy  PCP or Specialist appointment within 3-5 days of discharge Complete Complete  HRI or Home Care Consult Complete Complete  SW Recovery Care/Counseling Consult Complete Complete  Palliative Care Screening Not Applicable Not Applicable  Skilled Nursing Facility Not Applicable Not Applicable

## 2023-09-07 NOTE — Progress Notes (Signed)
    Referring Physician(s): Jasmine December, PA-C  Supervising Physician: Malachy Moan  Patient Status:  [facility] - In-pt  Chief Complaint: Gastric outlet obstruction, pancreatic adenocarcinoma cholecystitis, s/p cholecystostomy tube 3/1  Subjective:  Doing well, ready to go home. No blood in drainage bag, draining well. Denies any concerns at this time.   Allergies: Patient has no known allergies.  Intake/Output from previous day: 03/04 0701 - 03/05 0700 In: 1377.6 [P.O.:1080; I.V.:93.1; IV Piggyback:204.5] Out: 1180 [Urine:550; Drains:630] Intake/Output this shift: Total I/O In: 615 [P.O.:610; Other:5] Out: 145 [Drains:145] Vital Signs: BP 112/63 (BP Location: Left Arm)   Pulse (!) 58 Comment: notify to the nurse.  Temp 97.9 F (36.6 C) (Oral)   Resp 18   Ht 5\' 8"  (1.727 m)   Wt 210 lb 5.1 oz (95.4 kg)   SpO2 98%   BMI 31.98 kg/m   Physical Exam Abdominal:     Comments: Cholecystostomy tube present and draining, dressing in place  Neurological:     Mental Status: He is alert.     Assessment and Plan:  Gastric outlet obstruction, pancreatic adenocarcinoma cholecystitis, s/p cholecystostomy tube 3/1 -f/u 10/24/23 for evaluation and possible exchange of drain  -flush with 5cc NS daily, dressing changes every 2-3 days at home -call office with signs of infection or other concerns prior to f/u  -IR service agrees with dispo Electronically Signed: Carlton Adam, NP 09/07/2023, 3:40 PM   I spent a total of 15 Minutes at the the patient's bedside AND on the patient's hospital floor or unit, greater than 50% of which was counseling/coordinating care for Gastric outlet obstruction, pancreatic adenocarcinoma cholecystitis, s/p cholecystostomy tube 3/1

## 2023-09-07 NOTE — Progress Notes (Addendum)
 [facility] Progress Note  CC:  Gastric outlet obstruction, pancreatic adenocarcinoma   Subjective:  Doing well on low residue diet today.  Ready to go home.  Was seen by nutritionist.  No nausea or abdominal pain.  Passing flatus, no BM since 3/3 evening.  Objective:  Vital signs in last 24 hours: Temp:  [97.9 F (36.6 C)-99 F (37.2 C)] 97.9 F (36.6 C) (03/05 1303) Pulse Rate:  [58-60] 58 (03/05 1303) Resp:  [17-18] 18 (03/05 1303) BP: (112-145)/(63-77) 112/63 (03/05 1303) SpO2:  [97 %-98 %] 98 % (03/05 1303) Weight:  [95.4 kg] 95.4 kg (03/05 0500) Last BM Date : 09/04/23 General:  Alert, Well-developed, in NAD Heart:  Regular rate and rhythm; no murmurs Pulm:  CTAB.  No W/R/R. Abdomen:  Soft, non-distended.  BS present.  Non-tender. Extremities:  Slight edema in B/L LEs. Neurologic:  Alert and oriented x 4;  grossly normal neurologically. Psych:  Alert and cooperative. Normal mood and affect.  Intake/Output from previous day: 03/04 0701 - 03/05 0700 In: 1377.6 [P.O.:1080; I.V.:93.1; IV Piggyback:204.5] Out: 1180 [Urine:550; Drains:630] Intake/Output this shift: Total I/O In: 615 [P.O.:610; Other:5] Out: 145 [Drains:145]  Lab Results: Recent Labs    09/05/23 0500 09/06/23 0340 09/07/23 0226  WBC 4.5 4.6 3.6*  HGB 8.7* 8.9* 8.8*  HCT 29.0* 29.2* 28.8*  PLT 143* 139* 154   BMET Recent Labs    09/06/23 0340 09/07/23 0226  NA 135 135  K 3.6 3.4*  CL 104 105  CO2 23 23  GLUCOSE 108* 110*  BUN 18 15  CREATININE 1.16 1.18  CALCIUM 7.9* 7.8*   LFT Recent Labs    09/07/23 0226  PROT 5.2*  ALBUMIN 2.2*  AST 11*  ALT 11  ALKPHOS 55  BILITOT 0.6   DG Abd 2 Views Result Date: 09/06/2023 CLINICAL DATA:  Nausea, vomiting. EXAM: ABDOMEN - 2 VIEW COMPARISON:  June 22, 2022. FINDINGS: No abnormal bowel dilatation is noted. Surgical sutures are noted in left upper quadrant. Percutaneous drainage catheter is seen laterally in right upper  quadrant of uncertain etiology. Probable common bile duct and duodenal stents are noted. IMPRESSION: No abnormal bowel dilatation. Electronically Signed   By: Lupita Raider M.D.   On: 09/06/2023 13:14   DG C-Arm 1-60 Min-No Report Result Date: 09/05/2023 Fluoroscopy was utilized by the requesting physician.  No radiographic interpretation.   Assessment / Plan: Pancreatic adenocarcinoma s/p ucSEMS exchange 8/5 (10mm x 6 cm) Gastric outlet obstruction Acute cholecystitis s/p cholecystostomy tube 3/1 A Fib, CHB s/p pacemaker (eliquis on hold): Is on IV heparin currently.   EGD 3/3 by Dr. Meridee Score: - Mucosal changes in the duodenum bulb/ sweep leading to acquired duodenal stenosis. Biopsied. Prosthesis placed as above ( telescoped Wallflex x 2) . - Metal biliary stent at major papilla.   Biopsy results are benign with only nonspecific duodenitis consistent with peptic injury.  Doing well on low residue diet today.  Nutritionist has seen him.  GI will sign off.  Needs follow-up with oncology.  Can see GI/Dr. Meridee Score as needed.   LOS: 6 days   Princella Pellegrini. Zehr  09/07/2023, 2:26 PM     Attending physician's note   I have taken history, reviewed the chart and examined the patient. I performed a substantive portion of this encounter, including complete performance of at least one of the key components, in conjunction with the APP. I agree with the Advanced Practitioner's note, impression and recommendations.  Gastric outlet obstruction (Bx- neg for malignancy, likely d/t malignancy. D/d XRT) s/p enteral stent x 2 3/3 Pancreatic adenocarcinoma s/p ucSEMS exchange 8/5 (10mm x 6 cm) Acute cholecystitis s/p cholecystostomy tube 3/1 A Fib, CHB s/p pacemeker on eliquis  Plan: -Tolerating low residue diet -Has had nutritionist consult. -OK to go home from GI standpoint. -Will sign off for now. -Follow-up with Dr. Meridee Score as needed.  He has appointment with Dr. Truett Perna in coming  days.   Edman Circle, MD [facility] [phone]

## 2023-09-07 NOTE — Progress Notes (Signed)
 Medications delivered from [facility] outpatient [facility] by this RN

## 2023-09-07 NOTE — Progress Notes (Signed)
   09/07/23 0000  BiPAP/CPAP/SIPAP  BiPAP/CPAP/SIPAP Pt Type Adult  Reason BIPAP/CPAP not in use NG tube in place

## 2023-09-08 ENCOUNTER — Other Ambulatory Visit: Payer: Self-pay | Admitting: Adult Health

## 2023-09-08 ENCOUNTER — Telehealth: Payer: Self-pay

## 2023-09-08 ENCOUNTER — Other Ambulatory Visit (HOSPITAL_BASED_OUTPATIENT_CLINIC_OR_DEPARTMENT_OTHER): Payer: Self-pay

## 2023-09-08 ENCOUNTER — Ambulatory Visit: Payer: Non-veteran care

## 2023-09-08 DIAGNOSIS — F5101 Primary insomnia: Secondary | ICD-10-CM

## 2023-09-08 NOTE — Transitions of Care (Post Inpatient/ED Visit) (Signed)
   09/08/2023  Name: Marcus [name] MRN: [id number] DOB: April 03, 1941  Today's TOC FU Call Status: Today's TOC FU Call Status:: Unsuccessful Call (1st Attempt) Unsuccessful Call (1st Attempt) Date: 09/08/23  Attempted to reach the patient regarding the most recent Inpatient/ED visit.  Follow Up Plan: Additional outreach attempts will be made to reach the patient to complete the Transitions of Care (Post Inpatient/ED visit) call.   Deidre Ala, BSN, RN [place]  [facility] - [employer] [facility] RN Care Manager [phone]

## 2023-09-08 NOTE — Transitions of Care (Post Inpatient/ED Visit) (Signed)
   09/08/2023  Name: KATELYN KOHLMEYER MRN: [id number] DOB: December 30, 1940  Today's TOC FU Call Status: Today's TOC FU Call Status:: Unsuccessful Call (2nd Attempt) Unsuccessful Call (1st Attempt) Date: 09/08/23 Unsuccessful Call (2nd Attempt) Date: 09/08/23  Attempted to reach the patient regarding the most recent Inpatient/ED visit. The patient did not want the [facility] phone call and does not want to schedule an appointment for follow up to PCP  Follow Up Plan: No further outreach attempts will be made at this time. We have been unable to contact the patient.  Deidre Ala, BSN, RN [place]  [facility] - [employer] [facility] RN Care Manager [phone]

## 2023-09-08 NOTE — Telephone Encounter (Signed)
 Okay for refill?

## 2023-09-08 NOTE — Telephone Encounter (Signed)
 Okay for refill? Look like it was d/c by another provider

## 2023-09-09 ENCOUNTER — Ambulatory Visit: Payer: Non-veteran care

## 2023-09-09 ENCOUNTER — Other Ambulatory Visit: Payer: Self-pay | Admitting: *Deleted

## 2023-09-09 ENCOUNTER — Telehealth: Payer: Self-pay | Admitting: *Deleted

## 2023-09-09 ENCOUNTER — Other Ambulatory Visit: Payer: Self-pay | Admitting: Adult Health

## 2023-09-09 ENCOUNTER — Other Ambulatory Visit (HOSPITAL_BASED_OUTPATIENT_CLINIC_OR_DEPARTMENT_OTHER): Payer: Self-pay

## 2023-09-09 ENCOUNTER — Telehealth: Payer: Self-pay | Admitting: Gastroenterology

## 2023-09-09 DIAGNOSIS — C259 Malignant neoplasm of pancreas, unspecified: Secondary | ICD-10-CM

## 2023-09-09 LAB — AEROBIC/ANAEROBIC CULTURE W GRAM STAIN (SURGICAL/DEEP WOUND)

## 2023-09-09 MED ORDER — ESZOPICLONE 3 MG PO TABS
3.0000 mg | ORAL_TABLET | Freq: Every day | ORAL | 2 refills | Status: DC
  Filled 2023-09-09: qty 30, 30d supply, fill #0
  Filled 2023-10-22: qty 30, 30d supply, fill #1
  Filled 2023-11-23 (×2): qty 30, 30d supply, fill #2

## 2023-09-09 MED ORDER — OXYCODONE HCL 5 MG PO TABS
5.0000 mg | ORAL_TABLET | Freq: Four times a day (QID) | ORAL | 0 refills | Status: DC | PRN
  Filled 2023-09-09: qty 30, 8d supply, fill #0

## 2023-09-09 NOTE — Telephone Encounter (Signed)
 Received VM from daughter, Judeth Cornfield asking for someone to call her father today as he has left multiple messages. Called her back and left VM that this RN spoke with him today at ~ 10:00 and appointments were reviewed and seemed to be OK. Also mailed appointment calendar to his home.

## 2023-09-09 NOTE — Telephone Encounter (Signed)
 FYI see note from Home health.

## 2023-09-09 NOTE — Telephone Encounter (Signed)
 Spoke to pt and he has this one in his pill box. Pt does not have trazodone but states that which ever one will help him more then he will take that one.

## 2023-09-09 NOTE — Progress Notes (Signed)
 Per Dr. Truett Perna: Needs lab on 3/10 at [facility] and OV here 3/12. High priority scheduling message sent

## 2023-09-09 NOTE — Telephone Encounter (Signed)
 [name] called very upset and confused about his appointments stating no one has communicated with him. This RN called back and explained he has appointment at radiation oncology at [facility] at 11:30 today. Reminded him of the plan for radiation M-F with [facility] M-F for his pancreas mass. Will have labs on 3/11 at [facility] since he is already there for RT. Suggested he ask for a print out of all his appointments today when he is at RT and have someone explain it all to him.  Called and LVM with RT secretary and Dr. Broadus [name] nurse to please reach out to him.

## 2023-09-09 NOTE — Telephone Encounter (Signed)
 Amy Hyatt with [facility] called and stated that when they spoke with this patient, the patient was admit that he did not want PT service and stated that he was only needing a in home nurse to help him with his drain leakage. Amy stated that if we have anymore additional question we can reach out to her at [phone]. Please advise.

## 2023-09-10 NOTE — Discharge Summary (Addendum)
 Physician Discharge Summary   Patient: Marcus [name] MRN: [id number] DOB: Aug 29, 1940  Admit date:     09/01/2023  Discharge date: 09/07/2023  Discharge Physician: Marinda Elk   PCP: [name], NP   Recommendations at discharge:   Patient was advised to flush his cholecystostomy drain daily with 5 cc of normal saline as instructed. Patient was additionally advised on changing his cholecystostomy dressing every 2 to 3 days Patient was advised to take all prescribed occasions exactly instructed Patient was advised to make all follow-up appointments including with Dr. Truett Perna with oncology and interventional radiology  Discharge Diagnoses: Principal Problem:   Acute cholecystitis Active Problems:   Atrial fibrillation, chronic (HCC)   Pancreatic adenocarcinoma (HCC)   History of biliary duct stent placement   GERD (gastroesophageal reflux disease)   Cardiac pacemaker in situ   Biliary obstruction   Gastric outlet obstruction   Malignant neoplasm of head of pancreas (HCC)   Abnormal CT of the abdomen  Resolved Problems:   * No resolved hospital problems. *   Hospital Course: 83 year old male with pancreatic cancer, obstructive jaundice with history of ERCP and stent placement, HTN, CAD, OSA, currently getting chemo comes into the hospital with few days of abdominal pain, nausea, decreased p.o. intake.  Imaging in the ED with a CT scan of the abdomen and pelvis showed cholelithiasis with possible cholecystitis.  CBD stent was in place.  General surgery was consulted and he was admitted to the hospital.    HIDA scan performed 2/28 appeared to be consistent with cholecystitis.  Dr. Loreta Ave with  Interventional radiology was consulted and underwent percutaneous cholecystostomy tube placement 3/1.    Concerning CT imaging findings of possible gastric outlet obstruction,Dr. Chales Abrahams with gastroenterology was consulted GI consulted.  Decision was made to proceed with EGD 3/3 which  did indeed reveal a gastric outlet obstruction during which the patient had a duodenal stent placement.    With these interventions patient had substantial improvement in his symptoms.  Patient had a diet initiated which was advanced successfully.  Arranges were made for the patient to be discharged home in improved and stable condition on 09/07/2023.    Consultants: Dr. Truett Perna with Oncology, Dr. Loreta Ave with Interventional Radioloyg, Dr. Chales Abrahams with Gastroenterology, Dr. Donell Beers with General Surgery Procedures performed: Cholecystostomy tube placement 3/1.  Upper endoscopy with duodenal stent placement 3/3. Disposition: Home Diet recommendation:  Discharge Diet Orders (From admission, onward)     Start     Ordered   09/07/23 0000  Diet - low sodium heart healthy        09/07/23 1417           Cardiac diet  DISCHARGE MEDICATION: Allergies as of 09/07/2023   No Known Allergies      Medication List     STOP taking these medications    CALCIUM 600 + D PO   docusate sodium 100 MG capsule Commonly known as: COLACE   Eszopiclone 3 MG Tabs   mirtazapine 15 MG tablet Commonly known as: REMERON   ondansetron 4 MG tablet Commonly known as: ZOFRAN   valsartan-hydrochlorothiazide 160-25 MG tablet Commonly known as: DIOVAN-HCT       TAKE these medications    acetaminophen 325 MG tablet Commonly known as: TYLENOL Take 2 tablets (650 mg total) by mouth every 6 (six) hours as needed for mild pain (or Fever >/= 101).   allopurinol 300 MG tablet Commonly known as: ZYLOPRIM Take 300 mg by mouth daily.  amLODipine 10 MG tablet Commonly known as: NORVASC Take 10 mg by mouth daily.   bisacodyl 5 MG EC tablet Commonly known as: DULCOLAX Take 10 mg by mouth daily as needed for moderate constipation.   capecitabine 500 MG tablet Commonly known as: XELODA Take 3 tablets (1,500 mg total) by mouth 2 (two) times daily after a meal. Take Monday-Friday. Take only days of  radiation.   Carboxymethylcellulose Sod PF 0.5 % Soln Apply 1 drop to eye in the morning, at noon, in the evening, and at bedtime.   cyanocobalamin 500 MCG tablet Commonly known as: VITAMIN B12 Take 1 tablet by mouth 2 (two) times daily.   Eliquis 5 MG Tabs tablet Generic drug: apixaban Take 1 tablet (5 mg total) by mouth 2 (two) times daily.   fluticasone 50 MCG/ACT nasal spray Commonly known as: FLONASE Place 2 sprays into both nostrils daily.   Flutter Devi 1 each by Does not apply route in the morning and at bedtime.   glycerin adult 2 g suppository Place 1 suppository rectally as needed for constipation.   ipratropium 0.03 % nasal spray Commonly known as: ATROVENT Place 2 sprays into both nostrils every 12 (twelve) hours. What changed:  when to take this reasons to take this   lidocaine-prilocaine cream Commonly known as: EMLA Apply 1 Application topically as needed.   mirabegron ER 50 MG Tb24 tablet Commonly known as: MYRBETRIQ Take 1 tablet by mouth daily.   Normal Saline Flush 0.9 % Soln Inject 10 mLs into the vein daily.   oxybutynin 5 MG tablet Commonly known as: DITROPAN Take 5 mg by mouth 2 (two) times daily as needed (urinary incontinence).   polyethylene glycol powder 17 GM/SCOOP powder Commonly known as: GLYCOLAX/MIRALAX Mix 4 capfuls in a 32 oz Gatorade, Can repeat the next day for a satisfactory bowel movement. THEN one capful daily for maintenance   Potassium Gluconate 550 MG Tabs Take 550 mg by mouth daily.   rosuvastatin 20 MG tablet Commonly known as: CRESTOR Take 20 mg by mouth daily.   sodium chloride HYPERTONIC 3 % nebulizer solution Take by nebulization as needed for other.   tamsulosin 0.4 MG Caps capsule Commonly known as: FLOMAX Take 1 capsule by mouth daily.               Discharge Care Instructions  (From admission, onward)           Start     Ordered   09/07/23 0000  Discharge wound care:       Comments:  Change dressings over your drain as instructed   09/07/23 1417            Follow-up Information     [facility]INTER[facility]OLOG[facility] Follow up.   Specialty: Radiology Why: IR scheduler will call with appoinment date and time. Please call [phone] or after hours number [phone] with any questions or concerns. Contact information: [address] [place] [address] [phone]        [facility] Follow up.   Why: [facility] will provide home physical therapy visits Contact information: [address] [place] [address] [phone]         [name], MD. Schedule an appointment as soon as possible for a visit in 1 week(s).   Specialty: Oncology Contact information: [address] [place] [address] [phone]  Discharge Exam: Filed Weights   09/05/23 1322 09/06/23 0500 09/07/23 0500  Weight: 96.9 kg 94.9 kg 95.4 kg    Constitutional: Awake alert and oriented x3, no associated distress.   Respiratory: clear to auscultation bilaterally, no wheezing, no crackles. Normal respiratory effort. No accessory muscle use.  Cardiovascular: Regular rate and rhythm, no murmurs / rubs / gallops. No extremity edema. 2+ pedal pulses. No carotid bruits.  Abdomen: Abdomen is soft and nontender.  No evidence of intra-abdominal masses.  Positive bowel sounds noted in all quadrants.   Musculoskeletal: No joint deformity upper and lower extremities. Good ROM, no contractures. Normal muscle tone.     Condition at discharge: fair  The results of significant diagnostics from this hospitalization (including imaging, microbiology, ancillary and laboratory) are listed below for reference.   Imaging Studies: DG Abd 2 Views Result Date: 09/06/2023 CLINICAL DATA:  Nausea, vomiting. EXAM: ABDOMEN - 2 VIEW COMPARISON:  June 22, 2022. FINDINGS: No abnormal bowel  dilatation is noted. Surgical sutures are noted in left upper quadrant. Percutaneous drainage catheter is seen laterally in right upper quadrant of uncertain etiology. Probable common bile duct and duodenal stents are noted. IMPRESSION: No abnormal bowel dilatation. Electronically Signed   By: Lupita Raider M.D.   On: 09/06/2023 13:14   DG C-Arm 1-60 Min-No Report Result Date: 09/05/2023 Fluoroscopy was utilized by the requesting physician.  No radiographic interpretation.   IR Perc Cholecystostomy Result Date: 09/03/2023 INDICATION: 83 year old male with history of pancreatic adenocarcinoma and common bile duct stent placement with clinical and imaging findings of acute cholecystitis. EXAM: Ultrasound and fluoroscopic guided percutaneous cholecystostomy tube placement. MEDICATIONS: The patient was currently receiving intravenous antibiotics as an inpatient, no additional antibiotics were administered. ANESTHESIA/SEDATION: Moderate (conscious) sedation was employed during this procedure. A total of Versed 2 mg and Fentanyl 100 mcg was administered intravenously. Moderate Sedation Time: 10 minutes. The patient's level of consciousness and vital signs were monitored continuously by radiology nursing throughout the procedure under my direct supervision. FLUOROSCOPY TIME:  Nineteen mGy COMPLICATIONS: None immediate. PROCEDURE: Informed written consent was obtained from the patient after a thorough discussion of the procedural risks, benefits and alternatives. All questions were addressed. Maximal Sterile Barrier Technique was utilized including caps, mask, sterile gowns, sterile gloves, sterile drape, hand hygiene and skin antiseptic. A timeout was performed prior to the initiation of the procedure. The patient was placed supine on the angiographic table. The patient's right upper quadrant was then prepped and draped in normal sterile fashion with maximum sterile barrier. Ultrasound demonstrates a distended  gallbladder. Subdermal Local anesthesia was provided at the planned skin entry site. Under ultrasound guidance, deeper local anesthetic was provided through intercostal muscles and along the liver capsule. Ultrasound was used to puncture the gallbladder using a 21 gauge Chiba needle via a trans peritoneal approach with visualization of the lung treated to the gallbladder. A 0.018 inch wire was advanced into the lumen and a transition dilator placed. A gentle hand injection of contrast was performed. Cholecystogram demonstrates obstruction of the cystic duct. A 0.035 inch exchange wire was placed in the tract was dilated. A 10 French multipurpose drainage catheter was advanced into the gallbladder lumen. The drain was then secured in place using a 0-silk suture and a Stayfix device. A sterile dressing was applied. The tube was placed to bag drainage. A culture was sent to the lab for analysis. The patient tolerated procedure well without evidence of immediate complication was transferred back to the floor  in stable condition. IMPRESSION: Successful placement of 10 French percutaneous, trans peritoneal cholecystostomy tube. Marliss Coots, MD [facility] Radiology Electronically Signed   By: Marliss Coots M.D.   On: 09/03/2023 09:25   DG INTRO LONG GI TUBE Result Date: 09/02/2023 CLINICAL DATA:  Gastric outlet obstruction. EXAM: FL FEEDING TUBE PLACEMENT CONTRAST:  0 cc FLUOROSCOPY: Fluoroscopy Time:  1 minute Radiation Exposure Index (if provided by the fluoroscopic device): 18 Number of Acquired Spot Images: 0 COMPARISON:  CT 03/31/2024 FINDINGS: NG tube advanced into the distal stomach. Tube passes multiple surgical clips within the stomach suggesting passage through gastrostomy. IMPRESSION: NG tube advanced into distal stomach. Electronically Signed   By: Genevive Bi M.D.   On: 09/02/2023 16:54   NM Hepatobiliary Liver Func Result Date: 09/02/2023 CLINICAL  DATA:  Concern for cholecystitis. Gallbladder inflammation on CT EXAM: NUCLEAR MEDICINE HEPATOBILIARY IMAGING TECHNIQUE: Sequential images of the abdomen were obtained out to 60 minutes following intravenous administration of radiopharmaceutical. RADIOPHARMACEUTICALS:  5.2 mCi Tc-37m  Choletec IV COMPARISON:  CT 09/01/2023 FINDINGS: Prompt clearance radiotracer from blood pool and homogeneous uptake liver. Counts are evident in the small bowel by 20 minutes. Gallbladder fails to fill at 60 minutes. 3 mg IV morphine was administered to augment filling of the gallbladder. Gallbladder failed to fill following morphine augmentation. IMPRESSION: 1. Non filling the gallbladder is concerning for acute cholecystitis. Of note, patient has not eaten in days which can produce false positive findings. 2. Patent common bile duct. These results will be called to the ordering clinician or representative by the Radiologist Assistant, and communication documented in the PACS or [employer]. Electronically Signed   By: Genevive Bi M.D.   On: 09/02/2023 15:35   CT ABDOMEN PELVIS W CONTRAST Result Date: 09/01/2023 CLINICAL DATA:  Evaluate for bowel obstruction. EXAM: CT ABDOMEN AND PELVIS WITH CONTRAST TECHNIQUE: Multidetector CT imaging of the abdomen and pelvis was performed using the standard protocol following bolus administration of intravenous contrast. RADIATION DOSE REDUCTION: This exam was performed according to the departmental dose-optimization program which includes automated exposure control, adjustment of the mA and/or kV according to patient size and/or use of iterative reconstruction technique. CONTRAST:  OMNIPAQUE IOHEXOL 300 MG/ML  SOLN COMPARISON:  CT abdomen and pelvis 08/14/2023. FINDINGS: Lower chest: No acute abnormality. Hepatobiliary: Common bile duct stent is unchanged in position. Pneumobilia is unchanged. There are few hypodense lesions in the liver measuring up to 16 mm which are  unchanged from prior. Gallstones are present. There is inflammatory stranding surrounding the gallbladder. Pancreas: Ill-defined hypodense pancreatic head mass appears grossly unchanged. The body and tail of the pancreas are atrophic with ductal dilatation similar to prior. There is no new acute inflammation or fluid collection. Spleen: Normal in size without focal abnormality. Adrenals/Urinary Tract: 18 mm left renal cyst is again noted. There are punctate calcifications in the right kidney. There is no hydronephrosis. Adrenal glands and bladder are within normal limits. Stomach/Bowel: Sigmoid colon anastomosis is again noted. There are scattered colonic diverticula. The appendix and small bowel are within normal limits. Partial gastrectomy changes are present. The stomach is dilated with air-fluid level. There some wall thickening of the duodenal with mild surrounding inflammation. Vascular/Lymphatic: Aorta and IVC are normal in size. There is narrowing or occlusion of the portal vein near the portal confluence, unchanged. No enlarged lymph nodes are identified. Reproductive: Prostate radiotherapy seeds are present. Other: There surgical clips in the anterior abdominal wall. There is  mild inflammatory stranding in the subcutaneous tissues of the anterior abdominal wall. There are small fat containing bilateral inguinal hernias. Musculoskeletal: Multilevel degenerative changes affect the spine. IMPRESSION: 1. Cholelithiasis with inflammatory stranding surrounding the gallbladder. Findings are worrisome for acute cholecystitis. 2. The stomach is dilated which may represent gastritis or obstruction. There is inflammatory stranding and wall thickening involving the duodenal which may represent duodenitis. Tumor involvement not excluded. 3. Common bile duct stent is unchanged in position. 4. Stable hypodense lesions in the liver. 5. Stable pancreatic head mass. 6. Stable narrowing or occlusion of the portal vein near  the portal confluence. 7. Nonobstructing right renal stones. 8. Colonic diverticulosis. 9. Mild inflammatory stranding in the subcutaneous tissues of the anterior abdominal wall. Correlate clinically for cellulitis. Electronically Signed   By: Darliss Cheney M.D.   On: 09/01/2023 17:46   DG Chest Portable 1 View Result Date: 09/01/2023 CLINICAL DATA:  Weakness. EXAM: PORTABLE CHEST 1 VIEW COMPARISON:  February 27, 2024. FINDINGS: Stable cardiomediastinal silhouette. Left-sided pacemaker is unchanged. Right internal jugular Port-A-Cath is unchanged. Hypoinflation of the lungs is noted with probable minimal bibasilar subsegmental atelectasis. Bony thorax is unremarkable. IMPRESSION: Hypoinflation of the lungs with probable minimal bibasilar subsegmental atelectasis. Electronically Signed   By: Lupita Raider M.D.   On: 09/01/2023 17:05   CUP PACEART REMOTE DEVICE CHECK Result Date: 08/16/2023 Scheduled remote reviewed. Normal device function.  Next remote 91 days. [place], CVRS  CT ABDOMEN PELVIS W CONTRAST Addendum Date: 08/14/2023 ADDENDUM REPORT: 08/14/2023 01:56 ADDENDUM: Vascular. There is mild narrowing at the origin of the celiac artery due to calcified plaque. Remainder of the celiac artery is patent. Superior mesenteric artery and inferior mesenteric artery widely patent. Electronically Signed   By: Charlett Nose M.D.   On: 08/14/2023 01:56   Result Date: 08/14/2023 CLINICAL DATA:  Left upper quadrant pain EXAM: CT ABDOMEN AND PELVIS WITH CONTRAST TECHNIQUE: Multidetector CT imaging of the abdomen and pelvis was performed using the standard protocol following bolus administration of intravenous contrast. RADIATION DOSE REDUCTION: This exam was performed according to the departmental dose-optimization program which includes automated exposure control, adjustment of the mA and/or kV according to patient size and/or use of iterative reconstruction technique. CONTRAST:  OMNIPAQUE IOHEXOL 350 MG/ML SOLN  COMPARISON:  08/08/2023 FINDINGS: Lower chest: Bibasilar atelectasis, left greater than right. Pacer wires in the right heart. Hepatobiliary: Pneumobilia again noted. Metallic biliary stent is unchanged. Scattered cysts in the liver. No suspicious focal hepatic abnormality. Layering gallstones within the gallbladder. Mild surrounding pericholecystic inflammation again noted, unchanged. Pancreas: Again seen is the hypodense lesion in the pancreatic head/neck region, unchanged. Pancreatic ductal dilatation in the body and tail stable. No change since prior study. Spleen: No focal abnormality.  Normal size. Adrenals/Urinary Tract: No suspicious renal or adrenal abnormality. No stones or hydronephrosis. Urinary bladder unremarkable. Stomach/Bowel: Stable postoperative changes from partial gastrectomy. No bowel obstruction. Vascular/Lymphatic: Aortic atherosclerosis. No evidence of aneurysm or adenopathy. Reproductive: Radiation seeds in the prostate. Other: No free fluid or free air. Musculoskeletal: No acute bony abnormality. Degenerative changes in the lumbar spine. IMPRESSION: Stable appearance of the hypodense lesion in the pancreatic head with pancreatic ductal dilatation. Cholelithiasis.  Stable pericholecystic inflammation. Biliary stent with pneumobilia unchanged. Stable changes from prior partial gastrectomy. No evidence of bowel obstruction. Aortic atherosclerosis. Electronically Signed: By: Charlett Nose M.D. On: 08/14/2023 01:08   CT Angio Chest PE W and/or Wo Contrast Result Date: 08/14/2023 CLINICAL DATA:  Left upper quadrant pain  EXAM: CT ANGIOGRAPHY CHEST WITH CONTRAST TECHNIQUE: Multidetector CT imaging of the chest was performed using the standard protocol during bolus administration of intravenous contrast. Multiplanar CT image reconstructions and MIPs were obtained to evaluate the vascular anatomy. RADIATION DOSE REDUCTION: This exam was performed according to the departmental dose-optimization  program which includes automated exposure control, adjustment of the mA and/or kV according to patient size and/or use of iterative reconstruction technique. CONTRAST:  OMNIPAQUE IOHEXOL 350 MG/ML SOLN COMPARISON:  01/09/2023 FINDINGS: Cardiovascular: No filling defects in the pulmonary arteries to suggest pulmonary emboli. Cardiomegaly. Pacer wires in the right heart. Coronary artery and aortic atherosclerosis. Mediastinum/Nodes: No mediastinal, hilar, or axillary adenopathy. Trachea and esophagus are unremarkable. Thyroid unremarkable. Lungs/Pleura: Bibasilar atelectasis, left greater than right. No effusions or suspicious pulmonary nodules. Upper Abdomen: See abdominal CT report. Musculoskeletal: No acute bony abnormality. Chest wall soft tissues are unremarkable. Review of the MIP images confirms the above findings. IMPRESSION: No evidence of pulmonary embolus. Cardiomegaly, coronary artery disease. Bibasilar atelectasis, left greater than right. Aortic Atherosclerosis (ICD10-I70.0). Electronically Signed   By: Charlett Nose M.D.   On: 08/14/2023 01:03    Microbiology: Results for orders placed or performed during the hospital encounter of 09/01/23  Aerobic/Anaerobic Culture w Gram Stain (surgical/deep wound)     Status: None   Collection Time: 09/03/23  8:20 AM   Specimen: Abscess  Result Value Ref Range Status   Specimen Description ABSCESS  Final   Special Requests GALL BLADDER  Final   Gram Stain   Final    RARE WBC PRESENT, PREDOMINANTLY PMN ABUNDANT GRAM POSITIVE RODS ABUNDANT GRAM NEGATIVE RODS ABUNDANT GRAM POSITIVE COCCI    Culture   Final    ABUNDANT KLEBSIELLA OXYTOCA ABUNDANT STREPTOCOCCUS MITIS/ORALIS ABUNDANT ENTEROCOCCUS FAECALIS FEW PREVOTELLA BUCCAE BETA LACTAMASE POSITIVE Performed at [facility], [address], [place], [place] [address]    Report Status 09/09/2023 FINAL  Final   Organism ID, Bacteria KLEBSIELLA OXYTOCA  Final   Organism ID, Bacteria  STREPTOCOCCUS MITIS/ORALIS  Final   Organism ID, Bacteria ENTEROCOCCUS FAECALIS  Final      Susceptibility   Enterococcus faecalis - MIC*    AMPICILLIN <=2 SENSITIVE Sensitive     VANCOMYCIN 1 SENSITIVE Sensitive     GENTAMICIN SYNERGY SENSITIVE Sensitive     * ABUNDANT ENTEROCOCCUS FAECALIS   Klebsiella oxytoca - MIC*    AMPICILLIN >=32 RESISTANT Resistant     CEFEPIME <=0.12 SENSITIVE Sensitive     CEFTAZIDIME <=1 SENSITIVE Sensitive     CEFTRIAXONE <=0.25 SENSITIVE Sensitive     CIPROFLOXACIN <=0.25 SENSITIVE Sensitive     GENTAMICIN <=1 SENSITIVE Sensitive     IMIPENEM <=0.25 SENSITIVE Sensitive     TRIMETH/SULFA <=20 SENSITIVE Sensitive     AMPICILLIN/SULBACTAM 8 SENSITIVE Sensitive     PIP/TAZO <=4 SENSITIVE Sensitive ug/mL    * ABUNDANT KLEBSIELLA OXYTOCA   Streptococcus mitis/oralis - MIC*    PENICILLIN <=0.06 SENSITIVE Sensitive     CEFTRIAXONE <=0.12 SENSITIVE Sensitive     LEVOFLOXACIN 0.5 SENSITIVE Sensitive     VANCOMYCIN 0.5 SENSITIVE Sensitive     * ABUNDANT STREPTOCOCCUS MITIS/ORALIS   *Note: Due to a large number of results and/or encounters for the requested time period, some results have not been displayed. A complete set of results can be found in Results Review.    Labs: CBC: Recent Labs  Lab 09/04/23 0101 09/05/23 0500 09/06/23 0340 09/07/23 0226  WBC 7.6 4.5 4.6 3.6*  HGB  9.1* 8.7* 8.9* 8.8*  HCT 30.0* 29.0* 29.2* 28.8*  MCV 89.3 89.8 89.3 87.5  PLT 145* 143* 139* 154   Basic Metabolic Panel: Recent Labs  Lab 09/06/23 0340 09/07/23 0226  NA 135 135  K 3.6 3.4*  CL 104 105  CO2 23 23  GLUCOSE 108* 110*  BUN 18 15  CREATININE 1.16 1.18  CALCIUM 7.9* 7.8*  MG 2.0 2.0  PHOS  --  2.7   Liver Function Tests: Recent Labs  Lab 09/06/23 0340 09/07/23 0226  AST 13* 11*  ALT 12 11  ALKPHOS 59 55  BILITOT 0.6 0.6  PROT 5.5* 5.2*  ALBUMIN 2.3* 2.2*   CBG: No results for input(s): "GLUCAP" in the last 168 hours.  Discharge time  spent: greater than 30 minutes.  Signed: Marinda Elk, MD [facility] 09/11/2023

## 2023-09-12 ENCOUNTER — Ambulatory Visit
Admission: RE | Admit: 2023-09-12 | Discharge: 2023-09-12 | Disposition: A | Discharge status: Home / Self Care | Source: Ambulatory Visit | Attending: Radiation Oncology | Admitting: Radiation Oncology

## 2023-09-12 ENCOUNTER — Ambulatory Visit: Payer: Non-veteran care

## 2023-09-12 ENCOUNTER — Ambulatory Visit

## 2023-09-12 DIAGNOSIS — C250 Malignant neoplasm of head of pancreas: Secondary | ICD-10-CM | POA: Insufficient documentation

## 2023-09-12 NOTE — Progress Notes (Signed)
  Radiation Oncology         [phone] ________________________________  Name: RAJAN BURGARD MRN: [id number]  Date: 09/12/2023  DOB: 09-16-1940  SIMULATION PLANNING NOTE    ICD-10-CM   1. Malignant neoplasm of head of pancreas (HCC)  C25.0       DIAGNOSIS:  83 y/o man with locally advanced pancreatic cancer  NARRATIVE:  The patient was brought to the CT Simulation planning suite.  Identity was confirmed.  All relevant records and images related to the planned course of therapy were reviewed.  The patient underwent placeement of a duodenal stent last week which may alter anatomy, so return to CT sim was warranted to assess for changes.  Then, the patient was set-up in a stable reproducible  supine position for radiation therapy.  CT images were obtained.  Surface markings were placed.  The CT images were loaded into the planning software.  His stomach is significantly less distended warranting a new plan.  Then the target and avoidance structures were contoured.  Treatment planning then occurred.  The radiation prescription was entered and confirmed.  Then, I designed and supervised the construction multiple medically necessary complex treatment devices.  I have requested : 3D Simulation  I have requested a DVH of the following structures: Kidneys, stomach, liver, spinal cord and targets.    PLAN:  The patient will receive 50.4 Gy in 28 fractions as planned.  ________________________________  Artist Pais. Kathrynn Running, M.D.

## 2023-09-13 ENCOUNTER — Inpatient Hospital Stay: Attending: Oncology

## 2023-09-13 ENCOUNTER — Ambulatory Visit: Payer: Non-veteran care

## 2023-09-13 ENCOUNTER — Telehealth: Payer: Self-pay | Admitting: Nurse Practitioner

## 2023-09-13 DIAGNOSIS — I10 Essential (primary) hypertension: Secondary | ICD-10-CM | POA: Insufficient documentation

## 2023-09-13 DIAGNOSIS — K831 Obstruction of bile duct: Secondary | ICD-10-CM | POA: Insufficient documentation

## 2023-09-13 DIAGNOSIS — I442 Atrioventricular block, complete: Secondary | ICD-10-CM | POA: Insufficient documentation

## 2023-09-13 DIAGNOSIS — Z9884 Bariatric surgery status: Secondary | ICD-10-CM | POA: Insufficient documentation

## 2023-09-13 DIAGNOSIS — J479 Bronchiectasis, uncomplicated: Secondary | ICD-10-CM | POA: Insufficient documentation

## 2023-09-13 DIAGNOSIS — G4730 Sleep apnea, unspecified: Secondary | ICD-10-CM | POA: Insufficient documentation

## 2023-09-13 DIAGNOSIS — M109 Gout, unspecified: Secondary | ICD-10-CM | POA: Insufficient documentation

## 2023-09-13 DIAGNOSIS — C250 Malignant neoplasm of head of pancreas: Secondary | ICD-10-CM | POA: Insufficient documentation

## 2023-09-13 DIAGNOSIS — I4891 Unspecified atrial fibrillation: Secondary | ICD-10-CM | POA: Insufficient documentation

## 2023-09-13 DIAGNOSIS — D649 Anemia, unspecified: Secondary | ICD-10-CM | POA: Insufficient documentation

## 2023-09-13 DIAGNOSIS — R627 Adult failure to thrive: Secondary | ICD-10-CM | POA: Insufficient documentation

## 2023-09-13 DIAGNOSIS — K219 Gastro-esophageal reflux disease without esophagitis: Secondary | ICD-10-CM | POA: Insufficient documentation

## 2023-09-13 DIAGNOSIS — K2980 Duodenitis without bleeding: Secondary | ICD-10-CM | POA: Insufficient documentation

## 2023-09-13 DIAGNOSIS — F32A Depression, unspecified: Secondary | ICD-10-CM | POA: Insufficient documentation

## 2023-09-13 DIAGNOSIS — Z85038 Personal history of other malignant neoplasm of large intestine: Secondary | ICD-10-CM | POA: Insufficient documentation

## 2023-09-13 DIAGNOSIS — I2510 Atherosclerotic heart disease of native coronary artery without angina pectoris: Secondary | ICD-10-CM | POA: Insufficient documentation

## 2023-09-13 NOTE — Telephone Encounter (Signed)
 Spoke with patient confirming upcoming appointments

## 2023-09-14 ENCOUNTER — Ambulatory Visit

## 2023-09-14 ENCOUNTER — Inpatient Hospital Stay

## 2023-09-14 ENCOUNTER — Ambulatory Visit: Payer: Non-veteran care

## 2023-09-14 ENCOUNTER — Encounter: Payer: Self-pay | Admitting: Nurse Practitioner

## 2023-09-14 ENCOUNTER — Other Ambulatory Visit (HOSPITAL_BASED_OUTPATIENT_CLINIC_OR_DEPARTMENT_OTHER): Payer: Self-pay

## 2023-09-14 ENCOUNTER — Inpatient Hospital Stay: Admitting: Nurse Practitioner

## 2023-09-14 VITALS — BP 118/61 | HR 69 | Temp 98.2°F | Resp 18 | Ht 68.0 in | Wt 204.6 lb

## 2023-09-14 DIAGNOSIS — K219 Gastro-esophageal reflux disease without esophagitis: Secondary | ICD-10-CM | POA: Diagnosis not present

## 2023-09-14 DIAGNOSIS — K831 Obstruction of bile duct: Secondary | ICD-10-CM

## 2023-09-14 DIAGNOSIS — I2510 Atherosclerotic heart disease of native coronary artery without angina pectoris: Secondary | ICD-10-CM | POA: Diagnosis not present

## 2023-09-14 DIAGNOSIS — C259 Malignant neoplasm of pancreas, unspecified: Secondary | ICD-10-CM

## 2023-09-14 DIAGNOSIS — G4730 Sleep apnea, unspecified: Secondary | ICD-10-CM

## 2023-09-14 DIAGNOSIS — M109 Gout, unspecified: Secondary | ICD-10-CM

## 2023-09-14 DIAGNOSIS — I4891 Unspecified atrial fibrillation: Secondary | ICD-10-CM

## 2023-09-14 DIAGNOSIS — Z950 Presence of cardiac pacemaker: Secondary | ICD-10-CM

## 2023-09-14 DIAGNOSIS — C787 Secondary malignant neoplasm of liver and intrahepatic bile duct: Secondary | ICD-10-CM

## 2023-09-14 DIAGNOSIS — J479 Bronchiectasis, uncomplicated: Secondary | ICD-10-CM

## 2023-09-14 DIAGNOSIS — F32A Depression, unspecified: Secondary | ICD-10-CM | POA: Diagnosis not present

## 2023-09-14 DIAGNOSIS — Z9884 Bariatric surgery status: Secondary | ICD-10-CM | POA: Diagnosis not present

## 2023-09-14 DIAGNOSIS — Z85038 Personal history of other malignant neoplasm of large intestine: Secondary | ICD-10-CM | POA: Diagnosis not present

## 2023-09-14 DIAGNOSIS — I10 Essential (primary) hypertension: Secondary | ICD-10-CM

## 2023-09-14 DIAGNOSIS — K2980 Duodenitis without bleeding: Secondary | ICD-10-CM | POA: Diagnosis not present

## 2023-09-14 DIAGNOSIS — C250 Malignant neoplasm of head of pancreas: Secondary | ICD-10-CM | POA: Diagnosis present

## 2023-09-14 DIAGNOSIS — I455 Other specified heart block: Secondary | ICD-10-CM

## 2023-09-14 DIAGNOSIS — D649 Anemia, unspecified: Secondary | ICD-10-CM | POA: Diagnosis not present

## 2023-09-14 DIAGNOSIS — R627 Adult failure to thrive: Secondary | ICD-10-CM | POA: Diagnosis not present

## 2023-09-14 DIAGNOSIS — I442 Atrioventricular block, complete: Secondary | ICD-10-CM | POA: Diagnosis not present

## 2023-09-14 DIAGNOSIS — Z95828 Presence of other vascular implants and grafts: Secondary | ICD-10-CM

## 2023-09-14 LAB — CMP (CANCER CENTER ONLY)
ALT: 9 U/L (ref 0–44)
AST: 13 U/L — ABNORMAL LOW (ref 15–41)
Albumin: 3.3 g/dL — ABNORMAL LOW (ref 3.5–5.0)
Alkaline Phosphatase: 62 U/L (ref 38–126)
Anion gap: 6 (ref 5–15)
BUN: 17 mg/dL (ref 8–23)
CO2: 26 mmol/L (ref 22–32)
Calcium: 9 mg/dL (ref 8.9–10.3)
Chloride: 106 mmol/L (ref 98–111)
Creatinine: 1.04 mg/dL (ref 0.61–1.24)
GFR, Estimated: >60 mL/min (ref >60)
Glucose, Bld: 127 mg/dL — ABNORMAL HIGH (ref 70–99)
Potassium: 3.8 mmol/L (ref 3.5–5.1)
Sodium: 138 mmol/L (ref 135–145)
Total Bilirubin: 0.4 mg/dL (ref 0.0–1.2)
Total Protein: 6.2 g/dL — ABNORMAL LOW (ref 6.5–8.1)

## 2023-09-14 LAB — CBC WITH DIFFERENTIAL (CANCER CENTER ONLY)
Abs Immature Granulocytes: 0.03 10*3/uL (ref 0.00–0.07)
Basophils Absolute: 0 10*3/uL (ref 0.0–0.1)
Basophils Relative: 1 %
Eosinophils Absolute: 0.3 10*3/uL (ref 0.0–0.5)
Eosinophils Relative: 7 %
HCT: 28.2 % — ABNORMAL LOW (ref 39.0–52.0)
Hemoglobin: 9.1 g/dL — ABNORMAL LOW (ref 13.0–17.0)
Immature Granulocytes: 1 %
Lymphocytes Relative: 17 %
Lymphs Abs: 0.6 10*3/uL — ABNORMAL LOW (ref 0.7–4.0)
MCH: 27.2 pg (ref 26.0–34.0)
MCHC: 32.3 g/dL (ref 30.0–36.0)
MCV: 84.2 fL (ref 80.0–100.0)
Monocytes Absolute: 0.4 10*3/uL (ref 0.1–1.0)
Monocytes Relative: 10 %
Neutro Abs: 2.5 10*3/uL (ref 1.7–7.7)
Neutrophils Relative %: 64 %
Platelet Count: 203 10*3/uL (ref 150–400)
RBC: 3.35 MIL/uL — ABNORMAL LOW (ref 4.22–5.81)
RDW: 17 % — ABNORMAL HIGH (ref 11.5–15.5)
WBC Count: 3.8 10*3/uL — ABNORMAL LOW (ref 4.0–10.5)
nRBC: 0 % (ref 0.0–0.2)

## 2023-09-14 MED ORDER — HEPARIN SOD (PORK) LOCK FLUSH 100 UNIT/ML IV SOLN
500.0000 [IU] | Freq: Once | INTRAVENOUS | Status: AC
  Administered 2023-09-14: 500 [IU] via INTRAVENOUS

## 2023-09-14 MED ORDER — PREDNISONE 10 MG PO TABS
10.0000 mg | ORAL_TABLET | Freq: Every day | ORAL | 2 refills | Status: DC
  Filled 2023-09-14: qty 30, 30d supply, fill #0

## 2023-09-14 MED ORDER — SODIUM CHLORIDE 0.9% FLUSH
10.0000 mL | INTRAVENOUS | Status: DC | PRN
Start: 2023-09-14 — End: 2023-09-14
  Administered 2023-09-14: 10 mL

## 2023-09-14 NOTE — Progress Notes (Signed)
 [facility] OFFICE PROGRESS NOTE   Diagnosis: Pancreas cancer  INTERVAL HISTORY:   [name] returns as scheduled.  He is no longer having abdominal pain.  He reports the drain is "working".  No nausea or vomiting.  Bowels moving with the aid of a stool softener and laxative.  Appetite is poor.  He notes early satiety.  He is losing weight.  His son has noticed his voice is "weak".  Objective:  Vital signs in last 24 hours:  Blood pressure 118/61, pulse 69, temperature 98.2 F (36.8 C), temperature source Temporal, resp. rate 18, height 5\' 8"  (1.727 m), weight 204 lb 9.6 oz (92.8 kg), SpO2 100%.    HEENT: No thrush or ulcers.  Mucous membranes appear moist. Resp: Lungs clear bilaterally. Cardio: Regular rate and rhythm. GI: Abdomen is soft and nontender.  No hepatosplenomegaly.  No mass.  Drain site at right abdomen is covered with a bandage. Vascular: Trace lower leg edema bilaterally. Neuro: Alert and oriented.  Flat affect. Skin: Skin turgor mildly decreased. Port-A-Cath without erythema.  Lab Results:  Lab Results  Component Value Date   WBC 3.8 (L) 09/14/2023   HGB 9.1 (L) 09/14/2023   HCT 28.2 (L) 09/14/2023   MCV 84.2 09/14/2023   PLT 203 09/14/2023   NEUTROABS 2.5 09/14/2023    Imaging:  No results found.  Medications: I have reviewed the patient's current medications.  Assessment/Plan: Pancreas cancer CT abdomen/pelvis 12/03/2022-suspected pancreas mass in the proximal body measuring 2 cm with probable mass effect on the superior aspect of the SMV, multiple hypodense liver lesions, many are new from a remote CT-indeterminate, 2 cystic lesions in the pancreas tail-nonspecific prior bariatric surgery ERCP 12/13/2022-malignant appearing common bile duct stricture, plastic stent placed, brushing cytology-no malignant cells EUS 12/23/2022-30 x 33 mm pancreas head mass with invasion of the SMV, no malignant appearing lymph nodes, 4 cysts in the  visualized liver, FNA-adenocarcinoma Cycle 1 gemcitabine and abraxane 01/07/2023 CTs 01/09/2023-multiple low-attenuation liver lesions unchanged, largest of which are clearly benign fluid attenuation cyst, others more ill-defined and incompletely characterized suspicious for small metastases.  Similar appearance of hypodense mass in the pancreatic head.  Distended gallbladder with mild gallbladder wall thickening and adjacent pericholecystic fat stranding.  Sludge in the gallbladder fundus.  Common bile duct stent in place with diminished, persistent intrahepatic biliary ductal dilatation.  Negative for pulmonary embolism.  Acute appearing heterogeneous airspace opacity of the superior segment left lower lobe consistent with infection or aspiration. Cycle 2 gemcitabine and abraxane 01/21/2023, gemcitabine dose reduced due to neutropenia following cycle 1 Cycle 3 gemcitabine/Abraxane 02/04/2023 Cycle 4 gemcitabine/Abraxane 02/17/2023 Cycle 5 gemcitabine/Abraxane 03/04/2023 Cycle 6 gemcitabine/Abraxane 03/17/2023 CT abdomen/pelvis 03/28/2023-stable pancreas mass, multiple small hypoattenuating liver lesions (review of CTs at the GI tumor conference is most consistent with liver metastases-some smaller compared to the baseline CT) Cycle 7 gemcitabine/Abraxane 04/01/2023 Cycle 8 gemcitabine/Abraxane 04/15/2023 Cycle 9 gemcitabine/Abraxane 04/29/2023 Treatment held per patient request 05/12/2023 Cycle 10 gemcitabine/Abraxane 05/27/2023 Cycle 11 gemcitabine/Abraxane 06/10/2023 Cycle 12 gemcitabine/Abraxane 06/24/2023 CTs 07/01/2023-no significant difference in size of the mass in the pancreatic head.  Redemonstration of multiple hypoattenuating liver lesions essentially unchanged and favored to represent simple cysts. 07/08/2023-treatment held, referred for MRI to evaluate liver lesions 08/05/2023: MRI abdomen-unchanged pancreas head mass with encasement of the adjacent SMV and contacting the SMA, common bile duct stent  is patent, multiple fluid signal cyst in the liver, no solid lesion or suspicious contrast-enhancement, no evidence of lymphadenopathy of distant metastatic disease,  gallbladder wall thickening with sludge and small gallstones in the gallbladder and a small air-fluid level 08/08/2023: CT abdomen/pelvis-stable pancreas mass, progressive narrowing of the main portal vein with near occlusion, cholelithiasis and mild pericholecystic inflammatory stranding Plan for 5-1/2-week course of radiation plus concurrent Xeloda, start date 09/15/2023 Biliary obstruction secondary to 1-status post ERCP/stent placement 12/13/2022 Coronary artery disease Atrial fibrillation Gout Sleep apnea Status post bariatric surgery Hypertension Bronchiectasis Gastroesophageal reflux disease History of colon cancer in his 40s Complete heart block-pacemaker placed Admission 01/09/2023 with a fever and failure to thrive, CT chest with evidence of left lung pneumonia, CT abdomen/pelvis with a distended gallbladder and adjacent fat stranding Anemia 08/07/2023: Acute onset nausea/vomiting and abdominal pain-resolved after Zofran/morphine in the emergency room, CT without acute findings Depression - he has high score PHQ-9 today, no SI, plan, or access today. He declined referral to mental health provider. Has a trusting relationship with PCP and agrees to call him soon to discuss his mental health  Admission 09/01/2023 with intractable nausea/vomiting and abdominal distention CT abdomen/pelvis-dilated stomach, wall thickening of the duodenum, cholelithiasis with inflammatory stranding surrounding gallbladder, common bile duct stent unchanged 09/03/2023-percutaneous cholecystostomy tube 09/05/2023-upper endoscopy: Congestion, friability in the duodenal bulb and duodenal sweep, severe stenosis within the pyloric channel/duodenal bulb/duodenal sweep, wall flex stent placed, duodenal biopsy-nonspecific duodenitis, negative for dysplasia or  malignancy  Disposition: [name] has locally advanced pancreatic cancer.  He is scheduled to begin treatment with radiation and capecitabine tomorrow.  We again reviewed potential toxicities associated with capecitabine.  He agrees to proceed.    He has anorexia with weight loss.  He would like an appetite stimulant.  He has tried Remeron in the past, discontinued due to concern for side effects.  We discussed prednisone 10 mg daily.  We reviewed potential side effects.  He agrees to try prednisone for appetite stimulation.  He has a cholecystostomy tube.  We will arrange for follow-up in the drain clinic.  He will return for follow-up in approximately 2 weeks.  We are available to see him sooner if needed.  Patient seen with Dr. Truett Perna.    Lonna Cobb ANP/GNP-BC   09/14/2023  9:17 AM This was a shared visit with Lonna Cobb.  [name] was interviewed and examined.  He is scheduled to begin concurrent capecitabine and radiation tomorrow.  He was recently admitted with intractable nausea/vomiting and abdominal distention.  He underwent placement of a duodenal stent.  Pathology from a duodenal biopsy returned negative.  Mr. Arvie understands tumor may be causing the bowel obstruction despite the negative biopsy result.  He will begin capecitabine/radiation and attempt to control local tumor progression.  He will begin an appetite stimulant.  We will refer him to the interventional radiology drain clinic to discuss the indication for continuing the cholecystostomy tube.  I was present for greater than 50% of today's visit.  I performed medical decision making.  Mancel Bale, MD

## 2023-09-14 NOTE — Patient Instructions (Signed)

## 2023-09-15 ENCOUNTER — Ambulatory Visit: Payer: Non-veteran care

## 2023-09-15 ENCOUNTER — Ambulatory Visit
Admission: RE | Admit: 2023-09-15 | Discharge: 2023-09-15 | Discharge status: Home / Self Care | Source: Ambulatory Visit | Attending: Radiation Oncology

## 2023-09-15 ENCOUNTER — Other Ambulatory Visit: Payer: Self-pay

## 2023-09-15 DIAGNOSIS — C250 Malignant neoplasm of head of pancreas: Secondary | ICD-10-CM | POA: Diagnosis not present

## 2023-09-15 LAB — RAD ONC ARIA SESSION SUMMARY
Course Elapsed Days: 0
Plan Fractions Treated to Date: 1
Plan Prescribed Dose Per Fraction: 1.8 Gy
Plan Total Fractions Prescribed: 25
Plan Total Prescribed Dose: 45 Gy
Reference Point Dosage Given to Date: 1.8 Gy
Reference Point Session Dosage Given: 1.8 Gy
Session Number: 1

## 2023-09-16 ENCOUNTER — Ambulatory Visit
Admission: RE | Admit: 2023-09-16 | Discharge: 2023-09-16 | Disposition: A | Discharge status: Home / Self Care | Source: Ambulatory Visit | Attending: Radiation Oncology | Admitting: Radiation Oncology

## 2023-09-16 ENCOUNTER — Ambulatory Visit: Payer: Non-veteran care

## 2023-09-16 ENCOUNTER — Other Ambulatory Visit: Payer: Self-pay

## 2023-09-16 DIAGNOSIS — C250 Malignant neoplasm of head of pancreas: Secondary | ICD-10-CM | POA: Diagnosis not present

## 2023-09-16 LAB — RAD ONC ARIA SESSION SUMMARY
Course Elapsed Days: 1
Plan Fractions Treated to Date: 2
Plan Prescribed Dose Per Fraction: 1.8 Gy
Plan Total Fractions Prescribed: 25
Plan Total Prescribed Dose: 45 Gy
Reference Point Dosage Given to Date: 3.6 Gy
Reference Point Session Dosage Given: 1.8 Gy
Session Number: 2

## 2023-09-19 ENCOUNTER — Other Ambulatory Visit: Payer: Self-pay

## 2023-09-19 ENCOUNTER — Ambulatory Visit
Admission: RE | Admit: 2023-09-19 | Discharge: 2023-09-19 | Disposition: A | Discharge status: Home / Self Care | Payer: Non-veteran care | Source: Ambulatory Visit | Attending: Radiation Oncology

## 2023-09-19 ENCOUNTER — Telehealth: Payer: Self-pay | Admitting: *Deleted

## 2023-09-19 DIAGNOSIS — C250 Malignant neoplasm of head of pancreas: Secondary | ICD-10-CM | POA: Diagnosis not present

## 2023-09-19 LAB — RAD ONC ARIA SESSION SUMMARY
Course Elapsed Days: 4
Plan Fractions Treated to Date: 3
Plan Prescribed Dose Per Fraction: 1.8 Gy
Plan Total Fractions Prescribed: 25
Plan Total Prescribed Dose: 45 Gy
Reference Point Dosage Given to Date: 5.4 Gy
Reference Point Session Dosage Given: 1.8 Gy
Session Number: 3

## 2023-09-19 NOTE — Telephone Encounter (Signed)
 Return message from IR> They will see him on 3/20 and will call him.

## 2023-09-19 NOTE — Telephone Encounter (Addendum)
 Called [name] to f/u on his call to [facility] over weekend with diarrhea. He reports he was instructed to take Imodium and this has solved the problem. [name] is asking about his cholecystostomy drain. Continues to flush it day and has had no output  in 4 days.  Has IR appointment 4/21 for drain exchange. @ 1300: LVM for IR asking about if any intervention is need for his drain that has not had output in 4 days and when it would be removed?

## 2023-09-20 ENCOUNTER — Ambulatory Visit (INDEPENDENT_AMBULATORY_CARE_PROVIDER_SITE_OTHER): Admitting: Adult Health

## 2023-09-20 ENCOUNTER — Other Ambulatory Visit: Payer: Self-pay

## 2023-09-20 ENCOUNTER — Encounter: Payer: Self-pay | Admitting: Adult Health

## 2023-09-20 ENCOUNTER — Ambulatory Visit
Admission: RE | Admit: 2023-09-20 | Discharge: 2023-09-20 | Disposition: A | Discharge status: Home / Self Care | Payer: Non-veteran care | Source: Ambulatory Visit | Attending: Radiation Oncology

## 2023-09-20 VITALS — BP 100/60 | HR 72 | Temp 98.2°F

## 2023-09-20 DIAGNOSIS — C250 Malignant neoplasm of head of pancreas: Secondary | ICD-10-CM | POA: Diagnosis not present

## 2023-09-20 DIAGNOSIS — N3281 Overactive bladder: Secondary | ICD-10-CM

## 2023-09-20 LAB — RAD ONC ARIA SESSION SUMMARY
Course Elapsed Days: 5
Plan Fractions Treated to Date: 4
Plan Prescribed Dose Per Fraction: 1.8 Gy
Plan Total Fractions Prescribed: 25
Plan Total Prescribed Dose: 45 Gy
Reference Point Dosage Given to Date: 7.2 Gy
Reference Point Session Dosage Given: 1.8 Gy
Session Number: 4

## 2023-09-20 NOTE — Progress Notes (Signed)
 Subjective:    Patient ID: Marcus [name], male    DOB: 1941-03-24, 83 y.o.   MRN: [id number]  HPI 83 year old male who  has a past medical history of Acute meniscal tear of knee (left), Arthritis, AV block (08/01/2018), Benign essential hypertension (01/18/2007), Bronchiectasis with acute exacerbation (06/23/2020), Chest pain, atypical (08/23/2014), Chronic cough (10/07/2014), Coronary artery disease, Degeneration of lumbar intervertebral disc (10/14/2017), Diverticulosis of colon (07/07/2005), Elevated PSA (06/19/2014), GERD (gastroesophageal reflux disease) (01/18/2007), Gout, unspecified (01/18/2007), Hemorrhoids (01/19/2011), Hiatal hernia, History of colonic polyps (01/18/2007), Insomnia, unspecified (08/21/2007), Iron deficiency anemia, unspecified  (01/19/2011), Lumbar post-laminectomy syndrome (10/14/2017), Lumbar spondylolysis, Lumbar strain (12/14/2011), Obstructive sleep apnea (01/18/2007), Paraesophageal hernia, Primary osteoarthritis of knee (05/23/2015), Prostate cancer (HCC) (2016), S/P knee replacement (05/23/2015), Sensorineural hearing loss, bilateral, Vitamin B12 deficiency, and Vitamin D deficiency (10/28/2009).  He presents to the office today for urinary Bryan and incontinence.  He has known history of OAB and has a prescription for oxybutynin.  He reports that he has been going more frequently and urgently.  This is ongoing issue.  He denies dysuria or hematuria.  He brought his medications with him today and reports that he has not been taking his oxybutynin.   Review of Systems See HPI   Past Medical History:  Diagnosis Date   Acute meniscal tear of knee left   Arthritis    generalized   AV block 08/01/2018   Benign essential hypertension 01/18/2007   Bronchiectasis with acute exacerbation 06/23/2020   Chest pain, atypical 08/23/2014   Chronic cough 10/07/2014   Coronary artery disease    Degeneration of lumbar intervertebral disc 10/14/2017   Diverticulosis of  colon 07/07/2005   Elevated PSA 06/19/2014   GERD (gastroesophageal reflux disease) 01/18/2007   Gout, unspecified 01/18/2007   Hemorrhoids 01/19/2011   Hiatal hernia    History of colonic polyps 01/18/2007   Insomnia, unspecified 08/21/2007   Iron deficiency anemia, unspecified  01/19/2011   Lumbar post-laminectomy syndrome 10/14/2017   Lumbar spondylolysis    Lumbar strain 12/14/2011   Obstructive sleep apnea 01/18/2007   uses CPAP nightly   Paraesophageal hernia    large   Primary osteoarthritis of knee 05/23/2015   Prostate cancer (HCC) 2016   S/P knee replacement 05/23/2015   Sensorineural hearing loss, bilateral    Vitamin B12 deficiency    Vitamin D deficiency 10/28/2009    Social History   Socioeconomic History   Marital status: Widowed    Spouse name: Not on file   Number of children: 4   Years of education: 39   Highest education level: Some college, no degree  Occupational History   Occupation: retired    Associate Professor: [employer]  Tobacco Use   Smoking status: Former    Current packs/day: 0.00    Average packs/day: 2.0 packs/day for 20.0 years (40.0 ttl pk-yrs)    Types: Cigarettes    Start date: 07/05/1954    Quit date: 07/05/1974    Years since quitting: 49.2   Smokeless tobacco: Never  Vaping Use   Vaping status: Never Used  Substance and Sexual Activity   Alcohol use: Yes    Comment: wine   Drug use: No   Sexual activity: Not Currently  Other Topics Concern   Not on file  Social History Narrative   Recently widowed   Former Smoker    Alcohol use-yes 1-2 drinks per day      Occupation: Retired Associate Professor  Originally from [place], [place] - in [place] > 10 yrs as of 2017      Social Drivers of Health   Financial Resource Strain: Low Risk  (07/28/2021)   Overall Financial Resource Strain (CARDIA)    Difficulty of Paying Living Expenses: Not hard at all  Food Insecurity: No Food Insecurity (09/04/2023)   Hunger Vital Sign    Worried About Running  Out of Food in the Last Year: Never true    Ran Out of Food in the Last Year: Never true  Transportation Needs: No Transportation Needs (09/04/2023)   PRAPARE - Administrator, Civil Service (Medical): No    Lack of Transportation (Non-Medical): No  Physical Activity: Inactive (07/28/2021)   Exercise Vital Sign    Days of Exercise per Week: 0 days    Minutes of Exercise per Session: 0 min  Stress: No Stress Concern Present (07/28/2021)   [employer] of Occupational Health - Occupational Stress Questionnaire    Feeling of Stress : Not at all  Social Connections: Moderately Isolated (09/01/2023)   Social Connection and Isolation Panel [NHANES]    Frequency of Communication with Friends and Family: Three times a week    Frequency of Social Gatherings with Friends and Family: More than three times a week    Attends Religious Services: More than 4 times per year    Active Member of Clubs or Organizations: No    Attends Banker Meetings: Never    Marital Status: Widowed  Intimate Partner Violence: Not At Risk (09/04/2023)   Humiliation, Afraid, Rape, and Kick questionnaire    Fear of Current or Ex-Partner: No    Emotionally Abused: No    Physically Abused: No    Sexually Abused: No    Past Surgical History:  Procedure Laterality Date   BILIARY BRUSHING  12/13/2022   Procedure: BILIARY BRUSHING;  Surgeon: Iva Boop, MD;  Location: [facility] ENDOSCOPY;  Service: Gastroenterology;;   BILIARY STENT PLACEMENT  12/13/2022   Procedure: BILIARY STENT PLACEMENT;  Surgeon: Iva Boop, MD;  Location[facility] ENDOSCOPY;  Service: Gastroenterology;;   BILIARY STENT PLACEMENT N/A 02/07/2023   Procedure: BILIARY STENT PLACEMENT;  Surgeon: Lemar Lofty., MD;  Location: [facility] ENDOSCOPY;  Service: Gastroenterology;  Laterality: N/A;   BIOPSY  12/23/2022   Procedure: BIOPSY;  Surgeon: Meridee Score Netty Starring., MD;  Location: [facility] ENDOSCOPY;  Service: Gastroenterology;;   BIOPSY   09/05/2023   Procedure: BIOPSY;  Surgeon: Lemar Lofty., MD;  Location: [facility] ENDOSCOPY;  Service: Gastroenterology;;   BRONCHIAL WASHINGS  10/30/2019   Procedure: BRONCHIAL WASHINGS;  Surgeon: [name], DO;  Location: [facility] ENDOSCOPY;  Service: Endoscopy;;   CARDIAC CATHETERIZATION     CATARACT EXTRACTION W/ INTRAOCULAR LENS  IMPLANT, BILATERAL Bilateral    COLONOSCOPY     DUODENAL STENT PLACEMENT N/A 09/05/2023   Procedure: INSERTION, STENT, DUODENUM;  Surgeon: Lemar Lofty., MD;  Location: [facility] ENDOSCOPY;  Service: Gastroenterology;  Laterality: N/A;   ENDOSCOPIC RETROGRADE CHOLANGIOPANCREATOGRAPHY (ERCP) WITH PROPOFOL N/A 02/07/2023   Procedure: ENDOSCOPIC RETROGRADE CHOLANGIOPANCREATOGRAPHY (ERCP) WITH PROPOFOL;  Surgeon: Meridee Score Netty Starring., MD;  Location: [facility] ENDOSCOPY;  Service: Gastroenterology;  Laterality: N/A;  Stent change   ERCP N/A 12/13/2022   Procedure: ENDOSCOPIC RETROGRADE CHOLANGIOPANCREATOGRAPHY (ERCP);  Surgeon: Iva Boop, MD;  Location: [facility] ENDOSCOPY;  Service: Gastroenterology;  Laterality: N/A;  with stent placement   ESOPHAGOGASTRODUODENOSCOPY     ESOPHAGOGASTRODUODENOSCOPY (EGD) WITH PROPOFOL N/A 12/23/2022   Procedure: ESOPHAGOGASTRODUODENOSCOPY (EGD)  WITH PROPOFOL;  Surgeon: Mansouraty, Netty Starring., MD;  Location: [facility] ENDOSCOPY;  Service: Gastroenterology;  Laterality: N/A;   ESOPHAGOGASTRODUODENOSCOPY (EGD) WITH PROPOFOL N/A 09/05/2023   Procedure: ESOPHAGOGASTRODUODENOSCOPY (EGD) WITH PROPOFOL;  Surgeon: Meridee Score Netty Starring., MD;  Location: [facility] ENDOSCOPY;  Service: Gastroenterology;  Laterality: N/A;   EUS N/A 12/23/2022   Procedure: UPPER ENDOSCOPIC ULTRASOUND (EUS) RADIAL;  Surgeon: Lemar Lofty., MD;  Location: [facility] ENDOSCOPY;  Service: Gastroenterology;  Laterality: N/A;   FINE NEEDLE ASPIRATION  12/23/2022   Procedure: FINE NEEDLE ASPIRATION (FNA) RADIAL;  Surgeon: Lemar Lofty., MD;  Location: [facility] ENDOSCOPY;  Service:  Gastroenterology;;   IR IMAGING GUIDED PORT INSERTION  01/04/2023   IR PERC CHOLECYSTOSTOMY  09/03/2023   KNEE ARTHROSCOPY Right 2007   KNEE ARTHROSCOPY  07/13/2011   Procedure: ARTHROSCOPY KNEE;  Surgeon: Erasmo Leventhal;  Location: [facility];  Service: Orthopedics;  Laterality: Left;  partial menisectomy with chondrylplasty   KNEE SURGERY Left    LAMINECTOMY AND MICRODISCECTOMY LUMBAR SPINE  MARCH  2008   L3 -  4   LAPAROSCOPIC INCISIONAL / UMBILICAL / VENTRAL HERNIA REPAIR  2006   PACEMAKER IMPLANT N/A 08/02/2018   Procedure: PACEMAKER IMPLANT;  Surgeon: Marinus Maw, MD;  Location: [facility]IVE CV LAB;  Service: Cardiovascular;  Laterality: N/A;   PROSTATE BIOPSY     RADIOACTIVE SEED IMPLANT N/A 02/12/2019   Procedure: RADIOACTIVE SEED IMPLANT/BRACHYTHERAPY IMPLANT;  Surgeon: Marcine Matar, MD;  Location: [facility] ORS;  Service: Urology;  Laterality: N/A;  90 MINS   REMOVAL OF STONES  02/07/2023   Procedure: REMOVAL OF Sludge;  Surgeon: Meridee Score Netty Starring., MD;  Location: [facility] ENDOSCOPY;  Service: Gastroenterology;;   SHOULDER ARTHROSCOPY Right 05-23-2007   SIGMOID COLECTOMY FOR CANCER  1989   SPACE OAR INSTILLATION N/A 02/12/2019   Procedure: SPACE OAR INSTILLATION;  Surgeon: Marcine Matar, MD;  Location: [facility] ORS;  Service: Urology;  Laterality: N/A;   SPHINCTEROTOMY  12/13/2022   Procedure: SPHINCTEROTOMY;  Surgeon: Iva Boop, MD;  Location: [facility] ENDOSCOPY;  Service: Gastroenterology;;   Francine Graven REMOVAL  02/07/2023   Procedure: STENT REMOVAL;  Surgeon: Lemar Lofty., MD;  Location: [facility] ENDOSCOPY;  Service: Gastroenterology;;   TOTAL KNEE ARTHROPLASTY Left 05/23/2015   Procedure: LEFT TOTAL KNEE ARTHROPLASTY;  Surgeon: Eugenia Mcalpine, MD;  Location: [facility];  Service: Orthopedics;  Laterality: Left;   VERTICAL BANDED GASTROPLASTY  1986   VIDEO BRONCHOSCOPY N/A 10/30/2019   Procedure: VIDEO BRONCHOSCOPY WITH BRONICAL ALVEROLAR LAVAGE WITHOUT FLUORO;  Surgeon:  [name], DO;  Location: [facility] ENDOSCOPY;  Service: Endoscopy;  Laterality: N/A;    Family History  Problem Relation Age of Onset   Stroke Paternal Grandfather    Heart disease Paternal Grandfather    Esophagitis Father        died from perforated esophagus   Breast cancer Mother    Colon cancer Maternal Grandmother        questionable   Esophageal cancer Neg Hx    Prostate cancer Neg Hx    Stomach cancer Neg Hx     No Known Allergies  Current Outpatient Medications on File Prior to Visit  Medication Sig Dispense Refill   acetaminophen (TYLENOL) 325 MG tablet Take 2 tablets (650 mg total) by mouth every 6 (six) hours as needed for mild pain (or Fever >/= 101). 20 tablet 0   allopurinol (ZYLOPRIM) 300 MG tablet Take 300 mg by mouth daily.     amLODipine (NORVASC) 10 MG  tablet Take 10 mg by mouth daily.     apixaban (ELIQUIS) 5 MG TABS tablet Take 1 tablet (5 mg total) by mouth 2 (two) times daily. 180 tablet 1   bisacodyl (DULCOLAX) 5 MG EC tablet Take 10 mg by mouth daily as needed for moderate constipation.     capecitabine (XELODA) 500 MG tablet Take 3 tablets (1,500 mg total) by mouth 2 (two) times daily after a meal. Take Monday-Friday. Take only days of radiation. 168 tablet 0   Carboxymethylcellulose Sod PF 0.5 % SOLN Apply 1 drop to eye in the morning, at noon, in the evening, and at bedtime.     cyanocobalamin (VITAMIN B12) 500 MCG tablet Take 1 tablet by mouth 2 (two) times daily.     eszopiclone 3 MG TABS Take 1 tablet (3 mg total) by mouth at bedtime. Take immediately before bedtime. 30 tablet 2   fluticasone (FLONASE) 50 MCG/ACT nasal spray Place 2 sprays into both nostrils daily.     glycerin adult 2 g suppository Place 1 suppository rectally as needed for constipation. 12 suppository 0   ipratropium (ATROVENT) 0.03 % nasal spray Place 2 sprays into both nostrils every 12 (twelve) hours. (Patient taking differently: Place 2 sprays into both nostrils 2 (two) times daily  as needed for rhinitis.) 30 mL 1   lidocaine-prilocaine (EMLA) cream Apply 1 Application topically as needed. 30 g 0   mirabegron ER (MYRBETRIQ) 50 MG TB24 tablet Take 1 tablet by mouth daily.     oxybutynin (DITROPAN) 5 MG tablet Take 5 mg by mouth 2 (two) times daily as needed (urinary incontinence).     oxyCODONE (ROXICODONE) 5 MG immediate release tablet Take 1 tablet (5 mg total) by mouth every 6 (six) hours as needed for severe pain (pain score 7-10). 30 tablet 0   polyethylene glycol powder (GLYCOLAX/MIRALAX) 17 GM/SCOOP powder Mix 4 capfuls in a 32 oz Gatorade, Can repeat the next day for a satisfactory bowel movement. THEN one capful daily for maintenance 238 g 0   Potassium Gluconate 550 MG TABS Take 550 mg by mouth daily.     predniSONE (DELTASONE) 10 MG tablet Take 1 tablet (10 mg total) by mouth daily with breakfast. 30 tablet 2   Respiratory Therapy Supplies (FLUTTER) DEVI 1 each by Does not apply route in the morning and at bedtime. 1 each 0   rosuvastatin (CRESTOR) 20 MG tablet Take 20 mg by mouth daily.     Sodium Chloride Flush (NORMAL SALINE FLUSH) 0.9 % SOLN Inject 10 mLs into the vein daily. 300 mL 0   sodium chloride HYPERTONIC 3 % nebulizer solution Take by nebulization as needed for other. 750 mL 1   tamsulosin (FLOMAX) 0.4 MG CAPS capsule Take 1 capsule by mouth daily.     No current facility-administered medications on file prior to visit.    BP 100/60   Pulse 72   Temp 98.2 F (36.8 C) (Oral)   SpO2 97%       Objective:   Physical Exam Constitutional:      Appearance: He is ill-appearing (chronically).  Cardiovascular:     Pulses: Normal pulses.     Heart sounds: Normal heart sounds.  Pulmonary:     Effort: Pulmonary effort is normal.     Breath sounds: Normal breath sounds.  Abdominal:     General: Abdomen is flat. Bowel sounds are normal.     Palpations: Abdomen is soft.     Tenderness: There is no  right CVA tenderness or left CVA tenderness.   Musculoskeletal:        General: Normal range of motion.  Skin:    General: Skin is warm and dry.  Neurological:     General: No focal deficit present.     Mental Status: He is oriented to person, place, and time.  Psychiatric:        Mood and Affect: Mood normal.        Behavior: Behavior normal.        Thought Content: Thought content normal.        Judgment: Judgment normal.        Assessment & Plan:  1. OAB (overactive bladder) (Primary) -Encouraged to take his prescription for oxybutynin to help control OAB symptoms.  No concern for UTI at this time.  [name], NP

## 2023-09-21 ENCOUNTER — Ambulatory Visit: Payer: Non-veteran care

## 2023-09-21 ENCOUNTER — Other Ambulatory Visit: Payer: Non-veteran care

## 2023-09-21 ENCOUNTER — Other Ambulatory Visit: Payer: Self-pay

## 2023-09-21 ENCOUNTER — Ambulatory Visit: Payer: Non-veteran care | Admitting: Nurse Practitioner

## 2023-09-21 ENCOUNTER — Ambulatory Visit
Admission: RE | Admit: 2023-09-21 | Discharge: 2023-09-21 | Disposition: A | Discharge status: Home / Self Care | Source: Ambulatory Visit | Attending: Radiation Oncology

## 2023-09-21 DIAGNOSIS — C250 Malignant neoplasm of head of pancreas: Secondary | ICD-10-CM | POA: Diagnosis not present

## 2023-09-21 LAB — RAD ONC ARIA SESSION SUMMARY
Course Elapsed Days: 6
Plan Fractions Treated to Date: 5
Plan Prescribed Dose Per Fraction: 1.8 Gy
Plan Total Fractions Prescribed: 25
Plan Total Prescribed Dose: 45 Gy
Reference Point Dosage Given to Date: 9 Gy
Reference Point Session Dosage Given: 1.8 Gy
Session Number: 5

## 2023-09-21 NOTE — Progress Notes (Signed)
 Remote pacemaker transmission.

## 2023-09-21 NOTE — Addendum Note (Signed)
 Addended by: Geralyn Flash D on: 09/21/2023 04:18 PM   Modules accepted: Orders

## 2023-09-22 ENCOUNTER — Other Ambulatory Visit (HOSPITAL_COMMUNITY): Payer: Self-pay | Admitting: Radiology

## 2023-09-22 ENCOUNTER — Ambulatory Visit
Admission: RE | Admit: 2023-09-22 | Discharge: 2023-09-22 | Disposition: A | Discharge status: Home / Self Care | Source: Ambulatory Visit | Attending: Radiation Oncology | Admitting: Radiation Oncology

## 2023-09-22 ENCOUNTER — Ambulatory Visit: Payer: Non-veteran care

## 2023-09-22 ENCOUNTER — Ambulatory Visit (HOSPITAL_COMMUNITY)
Admission: RE | Admit: 2023-09-22 | Discharge: 2023-09-22 | Disposition: A | Discharge status: Home / Self Care | Source: Ambulatory Visit | Attending: Radiology | Admitting: Radiology

## 2023-09-22 ENCOUNTER — Other Ambulatory Visit: Payer: Self-pay

## 2023-09-22 DIAGNOSIS — K810 Acute cholecystitis: Secondary | ICD-10-CM

## 2023-09-22 DIAGNOSIS — C250 Malignant neoplasm of head of pancreas: Secondary | ICD-10-CM | POA: Diagnosis not present

## 2023-09-22 HISTORY — PX: IR SINUS/FIST TUBE CHK-NON GI: IMG673

## 2023-09-22 LAB — RAD ONC ARIA SESSION SUMMARY
Course Elapsed Days: 7
Plan Fractions Treated to Date: 6
Plan Prescribed Dose Per Fraction: 1.8 Gy
Plan Total Fractions Prescribed: 25
Plan Total Prescribed Dose: 45 Gy
Reference Point Dosage Given to Date: 10.8 Gy
Reference Point Session Dosage Given: 1.8 Gy
Session Number: 6

## 2023-09-22 MED ORDER — LIDOCAINE HCL 1 % IJ SOLN
INTRAMUSCULAR | Status: AC
  Filled 2023-09-22: qty 20

## 2023-09-22 MED ORDER — IOHEXOL 300 MG/ML  SOLN
50.0000 mL | Freq: Once | INTRAMUSCULAR | Status: AC | PRN
  Administered 2023-09-22: 10 mL

## 2023-09-22 NOTE — Procedures (Signed)
 Interventional Radiology Procedure Note  Procedure: cholecystostomy eval and removal    Complications: None  Estimated Blood Loss:  0  Findings: Existing cholcystostomy tube has retracted and is barely in the liver.  No clear tract back to the GB.  Catheter removed.  US shows no significant GB distention. Gallstones noted.  No pain over the GB.  D/w pt and son today.  They are aware to return to ED with any recurrent symptoms and get evaluated for replacement.      Sharen Counter, MD

## 2023-09-23 ENCOUNTER — Ambulatory Visit
Admission: RE | Admit: 2023-09-23 | Discharge: 2023-09-23 | Disposition: A | Discharge status: Home / Self Care | Source: Ambulatory Visit | Attending: Radiation Oncology | Admitting: Radiation Oncology

## 2023-09-23 ENCOUNTER — Other Ambulatory Visit: Payer: Self-pay

## 2023-09-23 ENCOUNTER — Ambulatory Visit: Payer: Non-veteran care

## 2023-09-23 DIAGNOSIS — C250 Malignant neoplasm of head of pancreas: Secondary | ICD-10-CM | POA: Diagnosis not present

## 2023-09-23 LAB — RAD ONC ARIA SESSION SUMMARY
Course Elapsed Days: 8
Plan Fractions Treated to Date: 7
Plan Prescribed Dose Per Fraction: 1.8 Gy
Plan Total Fractions Prescribed: 25
Plan Total Prescribed Dose: 45 Gy
Reference Point Dosage Given to Date: 12.6 Gy
Reference Point Session Dosage Given: 1.8 Gy
Session Number: 7

## 2023-09-26 ENCOUNTER — Other Ambulatory Visit: Payer: Self-pay

## 2023-09-26 ENCOUNTER — Telehealth: Payer: Self-pay | Admitting: *Deleted

## 2023-09-26 ENCOUNTER — Ambulatory Visit
Admission: RE | Admit: 2023-09-26 | Discharge: 2023-09-26 | Disposition: A | Discharge status: Home / Self Care | Payer: Non-veteran care | Source: Ambulatory Visit | Attending: Radiation Oncology | Admitting: Radiation Oncology

## 2023-09-26 DIAGNOSIS — C250 Malignant neoplasm of head of pancreas: Secondary | ICD-10-CM | POA: Diagnosis not present

## 2023-09-26 LAB — RAD ONC ARIA SESSION SUMMARY
Course Elapsed Days: 11
Plan Fractions Treated to Date: 8
Plan Prescribed Dose Per Fraction: 1.8 Gy
Plan Total Fractions Prescribed: 25
Plan Total Prescribed Dose: 45 Gy
Reference Point Dosage Given to Date: 14.4 Gy
Reference Point Session Dosage Given: 1.8 Gy
Session Number: 8

## 2023-09-26 NOTE — Telephone Encounter (Signed)
 Cholecystostomy tube removed on 3/20. Was told by IR to call Dr. Truett Perna if he develops any pain. Has developed a level "2" intermittent throbbing pain in left abdomen that resolves with prn oxycodone. No swelling at area or fever. Just wanted MD aware. Has F/U on 3/27. Continues RT daily w/Xeloda.

## 2023-09-27 ENCOUNTER — Other Ambulatory Visit (HOSPITAL_COMMUNITY): Payer: Self-pay

## 2023-09-27 ENCOUNTER — Ambulatory Visit
Admission: RE | Admit: 2023-09-27 | Discharge: 2023-09-27 | Disposition: A | Discharge status: Home / Self Care | Payer: Non-veteran care | Source: Ambulatory Visit | Attending: Radiation Oncology

## 2023-09-27 ENCOUNTER — Other Ambulatory Visit: Payer: Self-pay

## 2023-09-27 DIAGNOSIS — C250 Malignant neoplasm of head of pancreas: Secondary | ICD-10-CM

## 2023-09-27 LAB — RAD ONC ARIA SESSION SUMMARY
Course Elapsed Days: 12
Plan Fractions Treated to Date: 9
Plan Prescribed Dose Per Fraction: 1.8 Gy
Plan Total Fractions Prescribed: 25
Plan Total Prescribed Dose: 45 Gy
Reference Point Dosage Given to Date: 16.2 Gy
Reference Point Session Dosage Given: 1.8 Gy
Session Number: 9

## 2023-09-27 NOTE — Telephone Encounter (Signed)
 Notified Marcus [name] that Dr. Truett Perna thinks he can wait to be seen on 3/27 as scheduled as long as his abdominal pain is minimal.

## 2023-09-28 ENCOUNTER — Ambulatory Visit: Payer: Non-veteran care

## 2023-09-28 ENCOUNTER — Ambulatory Visit

## 2023-09-29 ENCOUNTER — Inpatient Hospital Stay

## 2023-09-29 ENCOUNTER — Inpatient Hospital Stay (HOSPITAL_BASED_OUTPATIENT_CLINIC_OR_DEPARTMENT_OTHER): Admitting: Nurse Practitioner

## 2023-09-29 ENCOUNTER — Other Ambulatory Visit: Payer: Self-pay

## 2023-09-29 ENCOUNTER — Encounter: Payer: Self-pay | Admitting: Nurse Practitioner

## 2023-09-29 ENCOUNTER — Ambulatory Visit: Admission: RE | Admit: 2023-09-29 | Source: Ambulatory Visit

## 2023-09-29 ENCOUNTER — Ambulatory Visit: Payer: Non-veteran care

## 2023-09-29 ENCOUNTER — Other Ambulatory Visit (HOSPITAL_BASED_OUTPATIENT_CLINIC_OR_DEPARTMENT_OTHER): Payer: Self-pay

## 2023-09-29 VITALS — BP 112/69 | HR 63 | Temp 98.1°F | Resp 18 | Ht 68.0 in | Wt 196.3 lb

## 2023-09-29 DIAGNOSIS — C250 Malignant neoplasm of head of pancreas: Secondary | ICD-10-CM

## 2023-09-29 DIAGNOSIS — C259 Malignant neoplasm of pancreas, unspecified: Secondary | ICD-10-CM

## 2023-09-29 LAB — CMP (CANCER CENTER ONLY)
ALT: 10 U/L (ref 0–44)
AST: 15 U/L (ref 15–41)
Albumin: 3.3 g/dL — ABNORMAL LOW (ref 3.5–5.0)
Alkaline Phosphatase: 51 U/L (ref 38–126)
Anion gap: 5 (ref 5–15)
BUN: 23 mg/dL (ref 8–23)
CO2: 27 mmol/L (ref 22–32)
Calcium: 8.8 mg/dL — ABNORMAL LOW (ref 8.9–10.3)
Chloride: 107 mmol/L (ref 98–111)
Creatinine: 0.95 mg/dL (ref 0.61–1.24)
GFR, Estimated: >60 mL/min (ref >60)
Glucose, Bld: 119 mg/dL — ABNORMAL HIGH (ref 70–99)
Potassium: 3.9 mmol/L (ref 3.5–5.1)
Sodium: 139 mmol/L (ref 135–145)
Total Bilirubin: 0.3 mg/dL (ref 0.0–1.2)
Total Protein: 6 g/dL — ABNORMAL LOW (ref 6.5–8.1)

## 2023-09-29 LAB — CBC WITH DIFFERENTIAL (CANCER CENTER ONLY)
Abs Immature Granulocytes: 0.02 10*3/uL (ref 0.00–0.07)
Basophils Absolute: 0 10*3/uL (ref 0.0–0.1)
Basophils Relative: 0 %
Eosinophils Absolute: 0.2 10*3/uL (ref 0.0–0.5)
Eosinophils Relative: 6 %
HCT: 29.7 % — ABNORMAL LOW (ref 39.0–52.0)
Hemoglobin: 9.3 g/dL — ABNORMAL LOW (ref 13.0–17.0)
Immature Granulocytes: 1 %
Lymphocytes Relative: 9 %
Lymphs Abs: 0.3 10*3/uL — ABNORMAL LOW (ref 0.7–4.0)
MCH: 26.5 pg (ref 26.0–34.0)
MCHC: 31.3 g/dL (ref 30.0–36.0)
MCV: 84.6 fL (ref 80.0–100.0)
Monocytes Absolute: 0.5 10*3/uL (ref 0.1–1.0)
Monocytes Relative: 17 %
Neutro Abs: 1.9 10*3/uL (ref 1.7–7.7)
Neutrophils Relative %: 67 %
Platelet Count: 167 10*3/uL (ref 150–400)
RBC: 3.51 MIL/uL — ABNORMAL LOW (ref 4.22–5.81)
RDW: 17.9 % — ABNORMAL HIGH (ref 11.5–15.5)
WBC Count: 2.8 10*3/uL — ABNORMAL LOW (ref 4.0–10.5)
nRBC: 0 % (ref 0.0–0.2)

## 2023-09-29 MED ORDER — SODIUM CHLORIDE 0.9% FLUSH
10.0000 mL | Freq: Once | INTRAVENOUS | Status: AC
  Administered 2023-09-29: 10 mL via INTRAVENOUS

## 2023-09-29 MED ORDER — HEPARIN SOD (PORK) LOCK FLUSH 100 UNIT/ML IV SOLN
500.0000 [IU] | Freq: Once | INTRAVENOUS | Status: AC
  Administered 2023-09-29: 500 [IU] via INTRAVENOUS

## 2023-09-29 MED ORDER — LORAZEPAM 0.5 MG PO TABS
0.5000 mg | ORAL_TABLET | Freq: Three times a day (TID) | ORAL | 0 refills | Status: DC | PRN
  Filled 2023-09-29: qty 30, 10d supply, fill #0

## 2023-09-29 NOTE — Patient Instructions (Signed)

## 2023-09-29 NOTE — Progress Notes (Signed)
 [facility] OFFICE PROGRESS NOTE   Diagnosis: Pancreas cancer  INTERVAL HISTORY:   Marcus [name] returns as scheduled.  He began concurrent radiation/Xeloda 09/15/2023.  He notes worsening fatigue.  Appetite is poor.  He develops nausea if he overeats.  No mouth sores.  He began having diarrhea yesterday morning.  He took Imodium with improvement.  Last loose stool was early this morning.  He has trouble sleeping.  His daughter notes he is anxious and depressed.  He has tried Remeron and prednisone, unable to tolerate either.  He feels if he could sleep he in general would feel better.  Objective:  Vital signs in last 24 hours:  Blood pressure 112/69, pulse 63, temperature 98.1 F (36.7 C), temperature source Temporal, resp. rate 18, height 5\' 8"  (1.727 m), weight 196 lb 4.8 oz (89 kg), SpO2 100%.    HEENT: No thrush or ulcers. Resp: Lungs clear bilaterally. Cardio: Regular rate and rhythm. GI: Abdomen soft with mild generalized tenderness.  No hepatosplenomegaly.  No mass. Vascular: No leg edema. Skin: Palms without erythema.  Soles are dry appearing, no erythema. Port-A-Cath without erythema.  Lab Results:  Lab Results  Component Value Date   WBC 2.8 (L) 09/29/2023   HGB 9.3 (L) 09/29/2023   HCT 29.7 (L) 09/29/2023   MCV 84.6 09/29/2023   PLT 167 09/29/2023   NEUTROABS 1.9 09/29/2023    Imaging:  No results found.  Medications: I have reviewed the patient's current medications.  Assessment/Plan: Pancreas cancer CT abdomen/pelvis 12/03/2022-suspected pancreas mass in the proximal body measuring 2 cm with probable mass effect on the superior aspect of the SMV, multiple hypodense liver lesions, many are new from a remote CT-indeterminate, 2 cystic lesions in the pancreas tail-nonspecific prior bariatric surgery ERCP 12/13/2022-malignant appearing common bile duct stricture, plastic stent placed, brushing cytology-no malignant cells EUS 12/23/2022-30 x 33 mm  pancreas head mass with invasion of the SMV, no malignant appearing lymph nodes, 4 cysts in the visualized liver, FNA-adenocarcinoma Cycle 1 gemcitabine and abraxane 01/07/2023 CTs 01/09/2023-multiple low-attenuation liver lesions unchanged, largest of which are clearly benign fluid attenuation cyst, others more ill-defined and incompletely characterized suspicious for small metastases.  Similar appearance of hypodense mass in the pancreatic head.  Distended gallbladder with mild gallbladder wall thickening and adjacent pericholecystic fat stranding.  Sludge in the gallbladder fundus.  Common bile duct stent in place with diminished, persistent intrahepatic biliary ductal dilatation.  Negative for pulmonary embolism.  Acute appearing heterogeneous airspace opacity of the superior segment left lower lobe consistent with infection or aspiration. Cycle 2 gemcitabine and abraxane 01/21/2023, gemcitabine dose reduced due to neutropenia following cycle 1 Cycle 3 gemcitabine/Abraxane 02/04/2023 Cycle 4 gemcitabine/Abraxane 02/17/2023 Cycle 5 gemcitabine/Abraxane 03/04/2023 Cycle 6 gemcitabine/Abraxane 03/17/2023 CT abdomen/pelvis 03/28/2023-stable pancreas mass, multiple small hypoattenuating liver lesions (review of CTs at the GI tumor conference is most consistent with liver metastases-some smaller compared to the baseline CT) Cycle 7 gemcitabine/Abraxane 04/01/2023 Cycle 8 gemcitabine/Abraxane 04/15/2023 Cycle 9 gemcitabine/Abraxane 04/29/2023 Treatment held per patient request 05/12/2023 Cycle 10 gemcitabine/Abraxane 05/27/2023 Cycle 11 gemcitabine/Abraxane 06/10/2023 Cycle 12 gemcitabine/Abraxane 06/24/2023 CTs 07/01/2023-no significant difference in size of the mass in the pancreatic head.  Redemonstration of multiple hypoattenuating liver lesions essentially unchanged and favored to represent simple cysts. 07/08/2023-treatment held, referred for MRI to evaluate liver lesions 08/05/2023: MRI abdomen-unchanged  pancreas head mass with encasement of the adjacent SMV and contacting the SMA, common bile duct stent is patent, multiple fluid signal cyst in the liver, no solid  lesion or suspicious contrast-enhancement, no evidence of lymphadenopathy of distant metastatic disease, gallbladder wall thickening with sludge and small gallstones in the gallbladder and a small air-fluid level 08/08/2023: CT abdomen/pelvis-stable pancreas mass, progressive narrowing of the main portal vein with near occlusion, cholelithiasis and mild pericholecystic inflammatory stranding Plan for 5-1/2-week course of radiation plus concurrent Xeloda, start date 09/15/2023-09/27/2023 (discontinued per patient) Biliary obstruction secondary to 1-status post ERCP/stent placement 12/13/2022 Coronary artery disease Atrial fibrillation Gout Sleep apnea Status post bariatric surgery Hypertension Bronchiectasis Gastroesophageal reflux disease History of colon cancer in his 40s Complete heart block-pacemaker placed Admission 01/09/2023 with a fever and failure to thrive, CT chest with evidence of left lung pneumonia, CT abdomen/pelvis with a distended gallbladder and adjacent fat stranding Anemia 08/07/2023: Acute onset nausea/vomiting and abdominal pain-resolved after Zofran/morphine in the emergency room, CT without acute findings Depression - he has high score PHQ-9 today, no SI, plan, or access today. He declined referral to mental health provider. Has a trusting relationship with PCP and agrees to call him soon to discuss his mental health  Admission 09/01/2023 with intractable nausea/vomiting and abdominal distention CT abdomen/pelvis-dilated stomach, wall thickening of the duodenum, cholelithiasis with inflammatory stranding surrounding gallbladder, common bile duct stent unchanged 09/03/2023-percutaneous cholecystostomy tube 09/05/2023-upper endoscopy: Congestion, friability in the duodenal bulb and duodenal sweep, severe stenosis within the  pyloric channel/duodenal bulb/duodenal sweep, wall flex stent placed, duodenal biopsy-nonspecific duodenitis, negative for dysplasia or malignancy  Disposition: Marcus [name] appears stable.  He began concurrent radiation and Xeloda 09/15/2023.  He has developed failure to thrive.  He feels this is secondary to treatment.  He wants to discontinue radiation and Xeloda and take a break.  If he feels better off of radiation/Xeloda treatment he will consider resuming systemic therapy.  We discussed end-of-life issues/CODE STATUS.  He was provided with paperwork on advanced directives.  Referral placed to social work.  His daughters would like to talk to someone from home palliative care regarding services provided.  We will make a referral.  Prescription for Ativan sent to his pharmacy.  He understands he should not drive while taking this or take with other sedating medications.  He will return for labs, port flush and follow-up in approximately 4 weeks.  We are available to see him sooner if needed.  Patient seen with Dr. Truett Perna.  Lonna Cobb ANP/GNP-BC   09/29/2023  2:41 PM  This was a shared visit with Lonna Cobb.  Marcus [name] began concurrent capecitabine and radiation 09/15/2023.  He has decided to discontinue the current treatment.  We discussed the prognosis and treatment options.  His daughters and son were present for today's visit.  He understands pancreas cancer may be responsible for the gastric outlet obstruction despite the negative duodenal biopsy. The plan is to follow him with observation.  He will be referred to home palliative care.  We began a discussion regarding CODE STATUS.  I was present for greater than 50% of today's visit.  I performed medical decision making.  Mancel Bale, MD

## 2023-09-30 ENCOUNTER — Ambulatory Visit: Payer: Non-veteran care

## 2023-09-30 ENCOUNTER — Telehealth: Payer: Self-pay

## 2023-09-30 ENCOUNTER — Ambulatory Visit: Admission: RE | Admit: 2023-09-30 | Source: Ambulatory Visit

## 2023-09-30 ENCOUNTER — Telehealth: Payer: Self-pay | Admitting: Nurse Practitioner

## 2023-09-30 ENCOUNTER — Ambulatory Visit

## 2023-09-30 DIAGNOSIS — R262 Difficulty in walking, not elsewhere classified: Secondary | ICD-10-CM | POA: Diagnosis not present

## 2023-09-30 DIAGNOSIS — Z9181 History of falling: Secondary | ICD-10-CM | POA: Diagnosis not present

## 2023-09-30 DIAGNOSIS — R2689 Other abnormalities of gait and mobility: Secondary | ICD-10-CM | POA: Diagnosis not present

## 2023-09-30 NOTE — Telephone Encounter (Signed)
 Pt verbalized understanding with Read back of information. Informed Pt to make sure he monitors his Blood pressure. Pt stated he will.

## 2023-09-30 NOTE — Telephone Encounter (Signed)
 [facility] Clinical Social Work  Clinical Social Work was referred by medical provider for assessment of psychosocial needs.  Clinical Social Worker contacted patient by phone to offer support and assess for needs.  CSW Provided education on advance directives and how to obtain one through [facility]. Patient preferred to speak with family prior to appointment. CSW provided direct contact.  Marguerita Merles, LCSW  Clinical Social Worker [facility]

## 2023-09-30 NOTE — Telephone Encounter (Signed)
-----   Message from Lonna Cobb sent at 09/30/2023  2:11 PM EDT ----- Please call him-blood pressure has been low the past few times he has been here.  Recommend he discontinue Norvasc and monitor blood pressure.

## 2023-09-30 NOTE — Telephone Encounter (Signed)
 Copied from CRM [id number]. Topic: General - Other >> Sep 30, 2023 10:32 AM Almira Coaster wrote: Reason for CRM: [facility] is calling to advise that they received a referral for palliative care for the patient and they will be following up with the patient. Their call back number [phone].

## 2023-10-03 ENCOUNTER — Ambulatory Visit: Payer: Non-veteran care

## 2023-10-03 ENCOUNTER — Inpatient Hospital Stay (HOSPITAL_COMMUNITY)
Admission: EM | Admit: 2023-10-03 | Discharge: 2023-10-11 | DRG: 871 | Disposition: A | Discharge status: Home Health | Payer: Self-pay | Attending: Internal Medicine | Admitting: Internal Medicine

## 2023-10-03 ENCOUNTER — Other Ambulatory Visit: Payer: Self-pay

## 2023-10-03 ENCOUNTER — Encounter (HOSPITAL_COMMUNITY): Payer: Self-pay

## 2023-10-03 ENCOUNTER — Emergency Department (HOSPITAL_COMMUNITY)

## 2023-10-03 DIAGNOSIS — Z8546 Personal history of malignant neoplasm of prostate: Secondary | ICD-10-CM

## 2023-10-03 DIAGNOSIS — I10 Essential (primary) hypertension: Secondary | ICD-10-CM | POA: Diagnosis present

## 2023-10-03 DIAGNOSIS — I5042 Chronic combined systolic (congestive) and diastolic (congestive) heart failure: Secondary | ICD-10-CM | POA: Diagnosis present

## 2023-10-03 DIAGNOSIS — E43 Unspecified severe protein-calorie malnutrition: Secondary | ICD-10-CM | POA: Diagnosis present

## 2023-10-03 DIAGNOSIS — R54 Age-related physical debility: Secondary | ICD-10-CM | POA: Diagnosis present

## 2023-10-03 DIAGNOSIS — Z823 Family history of stroke: Secondary | ICD-10-CM

## 2023-10-03 DIAGNOSIS — K810 Acute cholecystitis: Secondary | ICD-10-CM | POA: Diagnosis present

## 2023-10-03 DIAGNOSIS — B961 Klebsiella pneumoniae [K. pneumoniae] as the cause of diseases classified elsewhere: Secondary | ICD-10-CM | POA: Diagnosis present

## 2023-10-03 DIAGNOSIS — K311 Adult hypertrophic pyloric stenosis: Secondary | ICD-10-CM | POA: Diagnosis present

## 2023-10-03 DIAGNOSIS — I110 Hypertensive heart disease with heart failure: Secondary | ICD-10-CM | POA: Diagnosis present

## 2023-10-03 DIAGNOSIS — E785 Hyperlipidemia, unspecified: Secondary | ICD-10-CM | POA: Diagnosis present

## 2023-10-03 DIAGNOSIS — E876 Hypokalemia: Secondary | ICD-10-CM | POA: Diagnosis present

## 2023-10-03 DIAGNOSIS — R627 Adult failure to thrive: Secondary | ICD-10-CM | POA: Diagnosis not present

## 2023-10-03 DIAGNOSIS — K8013 Calculus of gallbladder with acute and chronic cholecystitis with obstruction: Secondary | ICD-10-CM | POA: Diagnosis present

## 2023-10-03 DIAGNOSIS — A419 Sepsis, unspecified organism: Principal | ICD-10-CM | POA: Diagnosis present

## 2023-10-03 DIAGNOSIS — K8689 Other specified diseases of pancreas: Secondary | ICD-10-CM | POA: Diagnosis not present

## 2023-10-03 DIAGNOSIS — J189 Pneumonia, unspecified organism: Secondary | ICD-10-CM | POA: Diagnosis present

## 2023-10-03 DIAGNOSIS — R188 Other ascites: Secondary | ICD-10-CM | POA: Diagnosis present

## 2023-10-03 DIAGNOSIS — Z803 Family history of malignant neoplasm of breast: Secondary | ICD-10-CM

## 2023-10-03 DIAGNOSIS — C787 Secondary malignant neoplasm of liver and intrahepatic bile duct: Secondary | ICD-10-CM | POA: Diagnosis present

## 2023-10-03 DIAGNOSIS — Z8249 Family history of ischemic heart disease and other diseases of the circulatory system: Secondary | ICD-10-CM

## 2023-10-03 DIAGNOSIS — C259 Malignant neoplasm of pancreas, unspecified: Secondary | ICD-10-CM | POA: Diagnosis present

## 2023-10-03 DIAGNOSIS — Z9842 Cataract extraction status, left eye: Secondary | ICD-10-CM

## 2023-10-03 DIAGNOSIS — R6521 Severe sepsis with septic shock: Secondary | ICD-10-CM | POA: Diagnosis present

## 2023-10-03 DIAGNOSIS — D696 Thrombocytopenia, unspecified: Secondary | ICD-10-CM | POA: Diagnosis not present

## 2023-10-03 DIAGNOSIS — D61818 Other pancytopenia: Secondary | ICD-10-CM | POA: Diagnosis present

## 2023-10-03 DIAGNOSIS — N179 Acute kidney failure, unspecified: Secondary | ICD-10-CM | POA: Diagnosis not present

## 2023-10-03 DIAGNOSIS — D630 Anemia in neoplastic disease: Secondary | ICD-10-CM | POA: Diagnosis present

## 2023-10-03 DIAGNOSIS — R578 Other shock: Secondary | ICD-10-CM | POA: Diagnosis not present

## 2023-10-03 DIAGNOSIS — Z9841 Cataract extraction status, right eye: Secondary | ICD-10-CM

## 2023-10-03 DIAGNOSIS — E871 Hypo-osmolality and hyponatremia: Secondary | ICD-10-CM | POA: Diagnosis not present

## 2023-10-03 DIAGNOSIS — Z66 Do not resuscitate: Secondary | ICD-10-CM | POA: Diagnosis present

## 2023-10-03 DIAGNOSIS — M109 Gout, unspecified: Secondary | ICD-10-CM | POA: Diagnosis present

## 2023-10-03 DIAGNOSIS — R739 Hyperglycemia, unspecified: Secondary | ICD-10-CM | POA: Diagnosis not present

## 2023-10-03 DIAGNOSIS — Z7901 Long term (current) use of anticoagulants: Secondary | ICD-10-CM

## 2023-10-03 DIAGNOSIS — Z860100 Personal history of colon polyps, unspecified: Secondary | ICD-10-CM

## 2023-10-03 DIAGNOSIS — Z96652 Presence of left artificial knee joint: Secondary | ICD-10-CM | POA: Diagnosis present

## 2023-10-03 DIAGNOSIS — Z87891 Personal history of nicotine dependence: Secondary | ICD-10-CM

## 2023-10-03 DIAGNOSIS — D6959 Other secondary thrombocytopenia: Secondary | ICD-10-CM | POA: Diagnosis present

## 2023-10-03 DIAGNOSIS — D72819 Decreased white blood cell count, unspecified: Secondary | ICD-10-CM | POA: Diagnosis not present

## 2023-10-03 DIAGNOSIS — G4733 Obstructive sleep apnea (adult) (pediatric): Secondary | ICD-10-CM | POA: Diagnosis present

## 2023-10-03 DIAGNOSIS — E872 Acidosis, unspecified: Secondary | ICD-10-CM | POA: Diagnosis present

## 2023-10-03 DIAGNOSIS — Z950 Presence of cardiac pacemaker: Secondary | ICD-10-CM

## 2023-10-03 DIAGNOSIS — Z515 Encounter for palliative care: Secondary | ICD-10-CM

## 2023-10-03 DIAGNOSIS — Z79899 Other long term (current) drug therapy: Secondary | ICD-10-CM

## 2023-10-03 DIAGNOSIS — I482 Chronic atrial fibrillation, unspecified: Secondary | ICD-10-CM | POA: Diagnosis present

## 2023-10-03 DIAGNOSIS — Z961 Presence of intraocular lens: Secondary | ICD-10-CM | POA: Diagnosis present

## 2023-10-03 DIAGNOSIS — I2510 Atherosclerotic heart disease of native coronary artery without angina pectoris: Secondary | ICD-10-CM | POA: Diagnosis present

## 2023-10-03 DIAGNOSIS — N400 Enlarged prostate without lower urinary tract symptoms: Secondary | ICD-10-CM | POA: Diagnosis present

## 2023-10-03 DIAGNOSIS — J9811 Atelectasis: Secondary | ICD-10-CM | POA: Diagnosis present

## 2023-10-03 DIAGNOSIS — R932 Abnormal findings on diagnostic imaging of liver and biliary tract: Secondary | ICD-10-CM | POA: Diagnosis not present

## 2023-10-03 DIAGNOSIS — I4891 Unspecified atrial fibrillation: Secondary | ICD-10-CM | POA: Diagnosis not present

## 2023-10-03 DIAGNOSIS — G893 Neoplasm related pain (acute) (chronic): Secondary | ICD-10-CM | POA: Diagnosis present

## 2023-10-03 DIAGNOSIS — D649 Anemia, unspecified: Secondary | ICD-10-CM | POA: Diagnosis not present

## 2023-10-03 DIAGNOSIS — R109 Unspecified abdominal pain: Secondary | ICD-10-CM | POA: Diagnosis not present

## 2023-10-03 DIAGNOSIS — K219 Gastro-esophageal reflux disease without esophagitis: Secondary | ICD-10-CM | POA: Diagnosis present

## 2023-10-03 DIAGNOSIS — K819 Cholecystitis, unspecified: Secondary | ICD-10-CM | POA: Diagnosis not present

## 2023-10-03 DIAGNOSIS — B955 Unspecified streptococcus as the cause of diseases classified elsewhere: Secondary | ICD-10-CM | POA: Diagnosis present

## 2023-10-03 DIAGNOSIS — B952 Enterococcus as the cause of diseases classified elsewhere: Secondary | ICD-10-CM | POA: Diagnosis present

## 2023-10-03 DIAGNOSIS — K7689 Other specified diseases of liver: Secondary | ICD-10-CM | POA: Diagnosis not present

## 2023-10-03 DIAGNOSIS — K828 Other specified diseases of gallbladder: Secondary | ICD-10-CM | POA: Diagnosis not present

## 2023-10-03 DIAGNOSIS — Z800 Family history of malignant neoplasm of digestive organs: Secondary | ICD-10-CM

## 2023-10-03 DIAGNOSIS — Z6828 Body mass index (BMI) 28.0-28.9, adult: Secondary | ICD-10-CM

## 2023-10-03 LAB — CBC
HCT: 28.5 % — ABNORMAL LOW (ref 39.0–52.0)
Hemoglobin: 9.1 g/dL — ABNORMAL LOW (ref 13.0–17.0)
MCH: 27.2 pg (ref 26.0–34.0)
MCHC: 31.9 g/dL (ref 30.0–36.0)
MCV: 85.3 fL (ref 80.0–100.0)
Platelets: 139 10*3/uL — ABNORMAL LOW (ref 150–400)
RBC: 3.34 MIL/uL — ABNORMAL LOW (ref 4.22–5.81)
RDW: 18.3 % — ABNORMAL HIGH (ref 11.5–15.5)
WBC: 2.9 10*3/uL — ABNORMAL LOW (ref 4.0–10.5)
nRBC: 0 % (ref 0.0–0.2)

## 2023-10-03 LAB — URINALYSIS, ROUTINE W REFLEX MICROSCOPIC
Bilirubin Urine: NEGATIVE
Glucose, UA: NEGATIVE mg/dL
Hgb urine dipstick: NEGATIVE
Ketones, ur: NEGATIVE mg/dL
Leukocytes,Ua: NEGATIVE
Nitrite: NEGATIVE
Protein, ur: NEGATIVE mg/dL
Specific Gravity, Urine: 1.017 (ref 1.005–1.030)
pH: 5 (ref 5.0–8.0)

## 2023-10-03 LAB — COMPREHENSIVE METABOLIC PANEL WITH GFR
ALT: 14 U/L (ref 0–44)
AST: 21 U/L (ref 15–41)
Albumin: 2.9 g/dL — ABNORMAL LOW (ref 3.5–5.0)
Alkaline Phosphatase: 54 U/L (ref 38–126)
Anion gap: 7 (ref 5–15)
BUN: 25 mg/dL — ABNORMAL HIGH (ref 8–23)
CO2: 22 mmol/L (ref 22–32)
Calcium: 8.6 mg/dL — ABNORMAL LOW (ref 8.9–10.3)
Chloride: 107 mmol/L (ref 98–111)
Creatinine, Ser: 1.06 mg/dL (ref 0.61–1.24)
GFR, Estimated: >60 mL/min (ref >60)
Glucose, Bld: 113 mg/dL — ABNORMAL HIGH (ref 70–99)
Potassium: 3.3 mmol/L — ABNORMAL LOW (ref 3.5–5.1)
Sodium: 136 mmol/L (ref 135–145)
Total Bilirubin: 0.6 mg/dL (ref 0.0–1.2)
Total Protein: 6 g/dL — ABNORMAL LOW (ref 6.5–8.1)

## 2023-10-03 LAB — LACTIC ACID, PLASMA
Lactic Acid, Venous: 0.6 mmol/L (ref 0.5–1.9)
Lactic Acid, Venous: 0.7 mmol/L (ref 0.5–1.9)

## 2023-10-03 LAB — LIPASE, BLOOD: Lipase: 23 U/L (ref 11–51)

## 2023-10-03 MED ORDER — PIPERACILLIN-TAZOBACTAM 3.375 G IVPB
3.3750 g | Freq: Three times a day (TID) | INTRAVENOUS | Status: DC
  Administered 2023-10-03 – 2023-10-07 (×11): 3.375 g via INTRAVENOUS
  Filled 2023-10-03 (×11): qty 50

## 2023-10-03 MED ORDER — ONDANSETRON HCL 4 MG PO TABS: 4.0000 mg | ORAL_TABLET | Freq: Four times a day (QID) | ORAL | Status: DC | PRN

## 2023-10-03 MED ORDER — IOHEXOL 300 MG/ML  SOLN
100.0000 mL | Freq: Once | INTRAMUSCULAR | Status: AC | PRN
  Administered 2023-10-03: 100 mL via INTRAVENOUS

## 2023-10-03 MED ORDER — OXYBUTYNIN CHLORIDE 5 MG PO TABS: 5.0000 mg | ORAL_TABLET | Freq: Two times a day (BID) | ORAL | Status: DC | PRN

## 2023-10-03 MED ORDER — ALBUTEROL SULFATE (2.5 MG/3ML) 0.083% IN NEBU: 2.5000 mg | INHALATION_SOLUTION | RESPIRATORY_TRACT | Status: DC | PRN

## 2023-10-03 MED ORDER — CHLORHEXIDINE GLUCONATE CLOTH 2 % EX PADS
6.0000 | MEDICATED_PAD | Freq: Every day | CUTANEOUS | Status: DC
  Administered 2023-10-03 – 2023-10-11 (×9): 6 via TOPICAL

## 2023-10-03 MED ORDER — ONDANSETRON HCL 4 MG/2ML IJ SOLN
4.0000 mg | Freq: Four times a day (QID) | INTRAMUSCULAR | Status: DC | PRN
  Administered 2023-10-04 – 2023-10-05 (×2): 4 mg via INTRAVENOUS
  Filled 2023-10-03 (×2): qty 2

## 2023-10-03 MED ORDER — LORAZEPAM 0.5 MG PO TABS
0.5000 mg | ORAL_TABLET | Freq: Three times a day (TID) | ORAL | Status: DC | PRN
  Administered 2023-10-05 – 2023-10-10 (×2): 0.5 mg via ORAL
  Filled 2023-10-03 (×2): qty 1

## 2023-10-03 MED ORDER — PIPERACILLIN-TAZOBACTAM 3.375 G IVPB 30 MIN
3.3750 g | Freq: Once | INTRAVENOUS | Status: AC
  Administered 2023-10-03: 3.375 g via INTRAVENOUS
  Filled 2023-10-03: qty 50

## 2023-10-03 MED ORDER — TAMSULOSIN HCL 0.4 MG PO CAPS
0.4000 mg | ORAL_CAPSULE | Freq: Every day | ORAL | Status: DC
  Administered 2023-10-05 – 2023-10-11 (×7): 0.4 mg via ORAL
  Filled 2023-10-03 (×7): qty 1

## 2023-10-03 MED ORDER — TRAZODONE HCL 50 MG PO TABS
50.0000 mg | ORAL_TABLET | Freq: Every evening | ORAL | Status: DC | PRN
  Administered 2023-10-03 – 2023-10-10 (×8): 50 mg via ORAL
  Filled 2023-10-03 (×8): qty 1

## 2023-10-03 MED ORDER — HYDROMORPHONE HCL 1 MG/ML IJ SOLN
1.0000 mg | Freq: Once | INTRAMUSCULAR | Status: DC
  Filled 2023-10-03: qty 1

## 2023-10-03 MED ORDER — FENTANYL CITRATE PF 50 MCG/ML IJ SOSY
50.0000 ug | PREFILLED_SYRINGE | Freq: Once | INTRAMUSCULAR | Status: AC
  Administered 2023-10-03: 50 ug via INTRAVENOUS
  Filled 2023-10-03: qty 1

## 2023-10-03 MED ORDER — ACETAMINOPHEN 650 MG RE SUPP: 650.0000 mg | Freq: Four times a day (QID) | RECTAL | Status: DC | PRN

## 2023-10-03 MED ORDER — AMLODIPINE BESYLATE 10 MG PO TABS: 10.0000 mg | ORAL_TABLET | Freq: Every day | ORAL | Status: DC

## 2023-10-03 MED ORDER — ONDANSETRON HCL 4 MG/2ML IJ SOLN
4.0000 mg | Freq: Once | INTRAMUSCULAR | Status: DC
  Filled 2023-10-03: qty 2

## 2023-10-03 MED ORDER — ENOXAPARIN SODIUM 40 MG/0.4ML IJ SOSY
40.0000 mg | PREFILLED_SYRINGE | INTRAMUSCULAR | Status: DC
  Administered 2023-10-03 – 2023-10-09 (×7): 40 mg via SUBCUTANEOUS
  Filled 2023-10-03 (×7): qty 0.4

## 2023-10-03 MED ORDER — SODIUM CHLORIDE 0.9 % IV BOLUS
1000.0000 mL | Freq: Once | INTRAVENOUS | Status: AC
  Administered 2023-10-03: 1000 mL via INTRAVENOUS

## 2023-10-03 MED ORDER — ROSUVASTATIN CALCIUM 20 MG PO TABS: 20.0000 mg | ORAL_TABLET | Freq: Every day | ORAL | Status: DC

## 2023-10-03 MED ORDER — MIRABEGRON ER 25 MG PO TB24
50.0000 mg | ORAL_TABLET | Freq: Every day | ORAL | Status: DC
  Filled 2023-10-03: qty 2

## 2023-10-03 MED ORDER — SODIUM CHLORIDE 0.9% FLUSH
10.0000 mL | INTRAVENOUS | Status: DC | PRN
  Administered 2023-10-11: 10 mL

## 2023-10-03 MED ORDER — SODIUM CHLORIDE 0.9% FLUSH
10.0000 mL | Freq: Two times a day (BID) | INTRAVENOUS | Status: DC
  Administered 2023-10-04 – 2023-10-06 (×5): 10 mL
  Administered 2023-10-07: 40 mL
  Administered 2023-10-07: 10 mL
  Administered 2023-10-08: 40 mL
  Administered 2023-10-08 – 2023-10-11 (×6): 10 mL

## 2023-10-03 MED ORDER — ACETAMINOPHEN 325 MG PO TABS
650.0000 mg | ORAL_TABLET | Freq: Four times a day (QID) | ORAL | Status: DC | PRN
  Administered 2023-10-05 – 2023-10-10 (×4): 650 mg via ORAL
  Filled 2023-10-03 (×5): qty 2

## 2023-10-03 MED ORDER — OXYCODONE HCL 5 MG PO TABS
5.0000 mg | ORAL_TABLET | Freq: Four times a day (QID) | ORAL | Status: DC | PRN
  Administered 2023-10-03 – 2023-10-05 (×4): 5 mg via ORAL
  Filled 2023-10-03 (×4): qty 1

## 2023-10-03 NOTE — Plan of Care (Signed)
  Problem: Pain Managment: Goal: General experience of comfort will improve and/or be controlled Outcome: Progressing   Problem: Safety: Goal: Ability to remain free from injury will improve Outcome: Progressing   Problem: Coping: Goal: Level of anxiety will decrease Outcome: Progressing

## 2023-10-03 NOTE — ED Provider Notes (Signed)
 [facility] AT [facility] Provider Note   CSN: [id number] Arrival date & time: 10/03/23  [id number]     History  Chief Complaint  Patient presents with   Abdominal Pain    JUSITN SALSGIVER is a 83 y.o. male, hx of pancreatic cancer, w/hx of bilary stent, who presents to the ED secondary to 3 days, right upper quadrant abdominal pain, that is sharp and stabbing.  He reports reduced p.o. and reduced bowel movements for the last couple days, but does endorse passing some gas.  Denies any nausea, vomiting, fevers, chills.  Had a biliary stent placed, a while back, and had it taken out about a week and a half ago, and has been doing overall well.  Notes that his last radiation therapy, was about a week and a half ago as well.  Home Medications Prior to Admission medications   Medication Sig Start Date End Date Taking? Authorizing Provider  acetaminophen (TYLENOL) 325 MG tablet Take 2 tablets (650 mg total) by mouth every 6 (six) hours as needed for mild pain (or Fever >/= 101). 01/13/23   Sheikh, Omair Latif, DO  allopurinol (ZYLOPRIM) 300 MG tablet Take 300 mg by mouth daily.    [provider]  amLODipine (NORVASC) 10 MG tablet Take 10 mg by mouth daily.    [provider]  apixaban (ELIQUIS) 5 MG TABS tablet Take 1 tablet (5 mg total) by mouth 2 (two) times daily. 02/23/23 09/20/23  Nafziger, Kandee Keen, NP  bisacodyl (DULCOLAX) 5 MG EC tablet Take 10 mg by mouth daily as needed for moderate constipation.    [provider]  capecitabine (XELODA) 500 MG tablet Take 3 tablets (1,500 mg total) by mouth 2 (two) times daily after a meal. Take Monday-Friday. Take only days of radiation. 08/25/23   [name], MD  Carboxymethylcellulose Sod PF 0.5 % SOLN Apply 1 drop to eye in the morning, at noon, in the evening, and at bedtime.    [provider]  cyanocobalamin (VITAMIN B12) 500 MCG tablet Take 1 tablet by mouth 2 (two) times daily.  07/26/23   [provider]  eszopiclone 3 MG TABS Take 1 tablet (3 mg total) by mouth at bedtime. Take immediately before bedtime. 09/09/23   Nafziger, Kandee Keen, NP  fluticasone (FLONASE) 50 MCG/ACT nasal spray Place 2 sprays into both nostrils daily.    [provider]  glycerin adult 2 g suppository Place 1 suppository rectally as needed for constipation. 08/14/23   Ernie Avena, MD  ipratropium (ATROVENT) 0.03 % nasal spray Place 2 sprays into both nostrils every 12 (twelve) hours. Patient taking differently: Place 2 sprays into both nostrils 2 (two) times daily as needed for rhinitis. 05/03/22     lidocaine-prilocaine (EMLA) cream Apply 1 Application topically as needed. 12/31/22   [name], MD  LORazepam (ATIVAN) 0.5 MG tablet Take 1 tablet (0.5 mg total) by mouth every 8 (eight) hours as needed for anxiety. 09/29/23   Rana Snare, NP  mirabegron ER (MYRBETRIQ) 50 MG TB24 tablet Take 1 tablet by mouth daily. 06/21/23   [provider]  oxybutynin (DITROPAN) 5 MG tablet Take 5 mg by mouth 2 (two) times daily as needed (urinary incontinence).    [provider]  oxyCODONE (ROXICODONE) 5 MG immediate release tablet Take 1 tablet (5 mg total) by mouth every 6 (six) hours as needed for severe pain (pain score 7-10). 09/09/23   [name], NP  polyethylene  glycol powder (GLYCOLAX/MIRALAX) 17 GM/SCOOP powder Mix 4 capfuls in a 32 oz Gatorade, Can repeat the next day for a satisfactory bowel movement. THEN one capful daily for maintenance 08/14/23   Ernie Avena, MD  Potassium Gluconate 550 MG TABS Take 550 mg by mouth daily.    [provider]  predniSONE (DELTASONE) 10 MG tablet Take 1 tablet (10 mg total) by mouth daily with breakfast. 09/14/23   Rana Snare, NP  Respiratory Therapy Supplies (FLUTTER) DEVI 1 each by Does not apply route in the morning and at bedtime. 10/11/19   [name], DO  rosuvastatin (CRESTOR) 20 MG tablet Take 20 mg by mouth  daily.    [provider]  Sodium Chloride Flush (NORMAL SALINE FLUSH) 0.9 % SOLN Inject 10 mLs into the vein daily. 09/04/23   Alene Mires, NP  sodium chloride HYPERTONIC 3 % nebulizer solution Take by nebulization as needed for other. 04/01/22   [name], NP  tamsulosin (FLOMAX) 0.4 MG CAPS capsule Take 1 capsule by mouth daily. 06/21/23   [provider]      Allergies    Patient has no known allergies.    Review of Systems   Review of Systems  Gastrointestinal:  Positive for abdominal pain. Negative for nausea and vomiting.    Physical Exam Updated Vital Signs BP 136/79   Pulse 75   Temp 97.9 F (36.6 C) (Oral)   Resp 18   Ht 5\' 8"  (1.727 m)   Wt 83.9 kg   SpO2 100%   BMI 28.13 kg/m  Physical Exam Vitals and nursing note reviewed.  Constitutional:      General: He is not in acute distress.    Appearance: He is well-developed.  HENT:     Head: Normocephalic and atraumatic.  Eyes:     Conjunctiva/sclera: Conjunctivae normal.  Cardiovascular:     Rate and Rhythm: Normal rate and regular rhythm.     Heart sounds: No murmur heard. Pulmonary:     Effort: Pulmonary effort is normal. No respiratory distress.     Breath sounds: Normal breath sounds.  Abdominal:     Palpations: Abdomen is soft.     Tenderness: There is abdominal tenderness in the right upper quadrant. There is guarding.  Musculoskeletal:        General: No swelling.     Cervical back: Neck supple.  Skin:    General: Skin is warm and dry.     Capillary Refill: Capillary refill takes less than 2 seconds.  Neurological:     Mental Status: He is alert.  Psychiatric:        Mood and Affect: Mood normal.     ED Results / Procedures / Treatments   Labs (all labs ordered are listed, but only abnormal results are displayed) Labs Reviewed  COMPREHENSIVE METABOLIC PANEL WITH GFR - Abnormal; Notable for the following components:      Result Value   Potassium 3.3 (*)     Glucose, Bld 113 (*)    BUN 25 (*)    Calcium 8.6 (*)    Total Protein 6.0 (*)    Albumin 2.9 (*)    All other components within normal limits  CBC - Abnormal; Notable for the following components:   WBC 2.9 (*)    RBC 3.34 (*)    Hemoglobin 9.1 (*)    HCT 28.5 (*)    RDW 18.3 (*)    Platelets 139 (*)  All other components within normal limits  LIPASE, BLOOD  URINALYSIS, ROUTINE W REFLEX MICROSCOPIC  LACTIC ACID, PLASMA  LACTIC ACID, PLASMA    EKG None  Radiology CT ABDOMEN PELVIS W CONTRAST Result Date: 10/03/2023 CLINICAL DATA:  Abdominal pain, acute, nonlocalized pancreatic cancer, hx of bilary stent, pain worse in ruq Right lower quadrant abdominal pain for 3 days with nausea. Recently completed radiation therapy. EXAM: CT ABDOMEN AND PELVIS WITH CONTRAST TECHNIQUE: Multidetector CT imaging of the abdomen and pelvis was performed using the standard protocol following bolus administration of intravenous contrast. RADIATION DOSE REDUCTION: This exam was performed according to the departmental dose-optimization program which includes automated exposure control, adjustment of the mA and/or kV according to patient size and/or use of iterative reconstruction technique. CONTRAST:  OMNIPAQUE IOHEXOL 300 MG/ML  SOLN COMPARISON:  Abdominopelvic CT 09/01/2023 and 08/14/2023. Abdominal radiographs 09/06/2023. FINDINGS: Lower chest: Chronic elevation of the left hemidiaphragm with associated chronic bibasilar atelectasis. The overall aeration of the left lung base has improved from the prior study. No significant pleural or pericardial effusion. Patient has a pacemaker. There is aortic and coronary artery atherosclerosis. Hepatobiliary: Grossly stable hepatic cysts. No suspicious liver lesions are identified. Metallic biliary stent is unchanged in position with persistent pneumobilia. Channing Savich gallstones, gallbladder wall thickening and pericholecystic inflammation, similar to previous CT. Since  the prior CT, the patient had a cholecystostomy tube placed and removed. Pancreas: Grossly stable ill-defined 2.2 cm mass involving the pancreatic head on image 29/4. Persistent moderate pancreatic ductal dilatation. No acute peripancreatic fluid collections are identified. Spleen: Normal in size without focal abnormality. Adrenals/Urinary Tract: Both adrenal glands appear normal. No evidence of urinary tract calculus, suspicious renal lesion or hydronephrosis. Unchanged renal cysts for which no specific follow-up imaging is recommended. The bladder appears unremarkable for its degree of distention. Stomach/Bowel: No enteric contrast was administered. A duodenal stent is in place (placed 09/05/2023) and extending from the distal stomach into the 2nd portion of the duodenum, grossly unchanged from reference prior radiographs. There is fluid density within the stent lumen without soft tissue components to suggest tumor ingrowth. The stomach remains decompressed with stable postsurgical changes proximally. No significant bowel distension, wall thickening or surrounding inflammation. The appendix appears normal. There is a patent distal colonic anastomosis. Vascular/Lymphatic: No enlarged abdominopelvic lymph nodes are identified. Diffuse aortic and branch vessel atherosclerosis without evidence of aneurysm or acute large vessel occlusion. There is chronic venous occlusion at the portal splenic confluence. Reproductive: Prostate brachytherapy seeds. Other: Postsurgical changes in the anterior abdominal wall. Generalized subcutaneous and mesenteric edema has mildly progressed compared with the prior CT. No ascites, focal fluid collection or pneumoperitoneum demonstrated. Musculoskeletal: No acute or significant osseous findings. Multilevel spondylosis. IMPRESSION: 1. Cholelithiasis with gallbladder wall thickening and pericholecystic inflammation, similar to previous CT. Since the prior CT, the patient had a  cholecystostomy tube placed and removed. Findings could reflect ongoing cholecystitis. 2. Interval duodenal stent placement with decompression of the stomach. 3. Grossly stable ill-defined pancreatic head mass with associated pancreatic ductal dilatation and chronic venous occlusion. No definite evidence of metastatic disease. 4. Metallic biliary stent remains in place with persistent pneumobilia. 5. Generalized subcutaneous and mesenteric edema has mildly progressed compared with the prior CT. 6.  Aortic Atherosclerosis (ICD10-I70.0). Electronically Signed   By: Carey Bullocks M.D.   On: 10/03/2023 12:23    Procedures Procedures    Medications Ordered in ED Medications  HYDROmorphone (DILAUDID) injection 1 mg (0 mg Intravenous Hold 10/03/23 1222)  ondansetron (ZOFRAN) injection 4 mg (has no administration in time range)  fentaNYL (SUBLIMAZE) injection 50 mcg (50 mcg Intravenous Given 10/03/23 0950)  sodium chloride 0.9 % bolus 1,000 mL (1,000 mLs Intravenous New Bag/Given 10/03/23 0950)  iohexol (OMNIPAQUE) 300 MG/ML solution 100 mL (100 mLs Intravenous Contrast Given 10/03/23 1102)  piperacillin-tazobactam (ZOSYN) IVPB 3.375 g (3.375 g Intravenous New Bag/Given 10/03/23 1256)    ED Course/ Medical Decision Making/ A&P                                 Medical Decision Making Patient is a 83 year old male, here for right upper quadrant pain, that is been going on for the last 3 days.  Has sharp stabbing pain, reduced p.o. intake, and reduced bowel movements.  Will obtain a CT abdomen pelvis, given his multiple surgeries, and reduced bowel movements.  Will give fluids, as well as nausea medicine to help with symptomatic control  Amount and/or Complexity of Data Reviewed Labs: ordered.    Details: Blood work unremarkable, no transaminitis, or leukocytosis Radiology: ordered.    Details: CT abdomen pelvis, shows cholelithiasis with gallbladder wall thickening, and pericholecystic  inflammation Discussion of management or test interpretation with external provider(s): Patient has findings concerning for cholecystitis, previously had PCT, but it was removed, given in displacement.  Here again for right upper quadrant pain, but afebrile, no leukocytosis or transaminitis, reached out, to IR, Dr. Buckner Malta, who states he will evaluate patient, admit to hospitalist. Admitted to Dr. Erenest Blank for IV antibiotics, pain control, and further evaluation by IR.  Risk Prescription drug management. Decision regarding hospitalization.    Final Clinical Impression(s) / ED Diagnoses Final diagnoses:  Cholecystitis    Rx / DC Orders ED Discharge Orders     None         Samaya Boardley, Harley [place], PA 10/03/23 1330    Gloris Manchester, MD 10/03/23 [phone]

## 2023-10-03 NOTE — ED Triage Notes (Addendum)
 Patient has had lower right quadrant abdominal pain for 3 days. Feels nauseous. No diarrhea. Has pancreatic cancer. Just finished radiation last week. Stated he had a tube in his infected gallbladder removed a couple of weeks ago.

## 2023-10-03 NOTE — Consult Note (Signed)
 Chief Complaint: Abdominal pain, cholelithiasis and pericholecystic inflammation - IR consulted for chole eval and possible repeat cholecystostomy  Referring Provider(s): Maryln Gottron, MD   Supervising Physician: Roanna Banning  Patient Status: [facility] - ED  History of Present Illness: Marcus [name] is a 83 y.o. male with hx of pancreatic cancer and known cholecystitis with previous percutaneous cholecystostomy and drain. Previous cholecystostomy performed 09/03/23 for acute cholecystitis and pt being a non-surgical candidate given progressing pancreatic cancer. Pt then followed up and had cholecystostomy tube removed 09/22/23 - tube at that time had retracted and not in gallbladder, pt was also reportedly pain free over the gallbladder and had no significant distention on [place]. Now back with worsening abd pain for the last 3 days. CT from ED visit today shows Cholelithiasis with gallbladder wall thickening and pericholecystic inflammation, similar to previous CT.   DNR/DNI. Provide all appropriate non-invasive medical interventions.  Past Medical History:  Diagnosis Date   Acute meniscal tear of knee left   Arthritis    generalized   AV block 08/01/2018   Benign essential hypertension 01/18/2007   Bronchiectasis with acute exacerbation 06/23/2020   Chest pain, atypical 08/23/2014   Chronic cough 10/07/2014   Coronary artery disease    Degeneration of lumbar intervertebral disc 10/14/2017   Diverticulosis of colon 07/07/2005   Elevated PSA 06/19/2014   GERD (gastroesophageal reflux disease) 01/18/2007   Gout, unspecified 01/18/2007   Hemorrhoids 01/19/2011   Hiatal hernia    History of colonic polyps 01/18/2007   Insomnia, unspecified 08/21/2007   Iron deficiency anemia, unspecified  01/19/2011   Lumbar post-laminectomy syndrome 10/14/2017   Lumbar spondylolysis    Lumbar strain 12/14/2011   Obstructive sleep apnea 01/18/2007   uses CPAP nightly   Paraesophageal hernia     large   Primary osteoarthritis of knee 05/23/2015   Prostate cancer (HCC) 2016   S/P knee replacement 05/23/2015   Sensorineural hearing loss, bilateral    Vitamin B12 deficiency    Vitamin D deficiency 10/28/2009    Past Surgical History:  Procedure Laterality Date   BILIARY BRUSHING  12/13/2022   Procedure: BILIARY BRUSHING;  Surgeon: Iva Boop, MD;  Location: [facility] ENDOSCOPY;  Service: Gastroenterology;;   BILIARY STENT PLACEMENT  12/13/2022   Procedure: BILIARY STENT PLACEMENT;  Surgeon: Iva Boop, MD;  Location: [facility] ENDOSCOPY;  Service: Gastroenterology;;   BILIARY STENT PLACEMENT N/A 02/07/2023   Procedure: BILIARY STENT PLACEMENT;  Surgeon: Lemar Lofty., MD;  Location: [facility] ENDOSCOPY;  Service: Gastroenterology;  Laterality: N/A;   BIOPSY  12/23/2022   Procedure: BIOPSY;  Surgeon: Meridee Score Netty Starring., MD;  Location: [facility] ENDOSCOPY;  Service: Gastroenterology;;   BIOPSY  09/05/2023   Procedure: BIOPSY;  Surgeon: Lemar Lofty., MD;  Location: [facility] ENDOSCOPY;  Service: Gastroenterology;;   BRONCHIAL WASHINGS  10/30/2019   Procedure: BRONCHIAL WASHINGS;  Surgeon: [name], DO;  Location: [facility] ENDOSCOPY;  Service: Endoscopy;;   CARDIAC CATHETERIZATION     CATARACT EXTRACTION W/ INTRAOCULAR LENS  IMPLANT, BILATERAL Bilateral    COLONOSCOPY     DUODENAL STENT PLACEMENT N/A 09/05/2023   Procedure: INSERTION, STENT, DUODENUM;  Surgeon: Lemar Lofty., MD;  Location: [facility] ENDOSCOPY;  Service: Gastroenterology;  Laterality: N/A;   ENDOSCOPIC RETROGRADE CHOLANGIOPANCREATOGRAPHY (ERCP) WITH PROPOFOL N/A 02/07/2023   Procedure: ENDOSCOPIC RETROGRADE CHOLANGIOPANCREATOGRAPHY (ERCP) WITH PROPOFOL;  Surgeon: Meridee Score Netty Starring., MD;  Location: [facility] ENDOSCOPY;  Service: Gastroenterology;  Laterality: N/A;  Stent change   ERCP N/A  12/13/2022   Procedure: ENDOSCOPIC RETROGRADE CHOLANGIOPANCREATOGRAPHY (ERCP);  Surgeon: Iva Boop, MD;  Location: [facility] ENDOSCOPY;   Service: Gastroenterology;  Laterality: N/A;  with stent placement   ESOPHAGOGASTRODUODENOSCOPY     ESOPHAGOGASTRODUODENOSCOPY (EGD) WITH PROPOFOL N/A 12/23/2022   Procedure: ESOPHAGOGASTRODUODENOSCOPY (EGD) WITH PROPOFOL;  Surgeon: Meridee Score Netty Starring., MD;  Location: [facility] ENDOSCOPY;  Service: Gastroenterology;  Laterality: N/A;   ESOPHAGOGASTRODUODENOSCOPY (EGD) WITH PROPOFOL N/A 09/05/2023   Procedure: ESOPHAGOGASTRODUODENOSCOPY (EGD) WITH PROPOFOL;  Surgeon: Meridee Score Netty Starring., MD;  Location: [facility] ENDOSCOPY;  Service: Gastroenterology;  Laterality: N/A;   EUS N/A 12/23/2022   Procedure: UPPER ENDOSCOPIC ULTRASOUND (EUS) RADIAL;  Surgeon: Lemar Lofty., MD;  Location: [facility] ENDOSCOPY;  Service: Gastroenterology;  Laterality: N/A;   FINE NEEDLE ASPIRATION  12/23/2022   Procedure: FINE NEEDLE ASPIRATION (FNA) RADIAL;  Surgeon: Lemar Lofty., MD;  Location: [facility] ENDOSCOPY;  Service: Gastroenterology;;   IR IMAGING GUIDED PORT INSERTION  01/04/2023   IR PERC CHOLECYSTOSTOMY  09/03/2023   IR SINUS/FIST TUBE CHK-NON GI  09/22/2023   KNEE ARTHROSCOPY Right 2007   KNEE ARTHROSCOPY  07/13/2011   Procedure: ARTHROSCOPY KNEE;  Surgeon: Erasmo Leventhal;  Location: [facility];  Service: Orthopedics;  Laterality: Left;  partial menisectomy with chondrylplasty   KNEE SURGERY Left    LAMINECTOMY AND MICRODISCECTOMY LUMBAR SPINE  MARCH  2008   L3 -  4   LAPAROSCOPIC INCISIONAL / UMBILICAL / VENTRAL HERNIA REPAIR  2006   PACEMAKER IMPLANT N/A 08/02/2018   Procedure: PACEMAKER IMPLANT;  Surgeon: Marinus Maw, MD;  Location: [facility]VASIVE CV LAB;  Service: Cardiovascular;  Laterality: N/A;   PROSTATE BIOPSY     RADIOACTIVE SEED IMPLANT N/A 02/12/2019   Procedure: RADIOACTIVE SEED IMPLANT/BRACHYTHERAPY IMPLANT;  Surgeon: Marcine Matar, MD;  Location: [facility] ORS;  Service: Urology;  Laterality: N/A;  90 MINS   REMOVAL OF STONES  02/07/2023   Procedure: REMOVAL OF Sludge;  Surgeon:  Meridee Score Netty Starring., MD;  Location: [facility] ENDOSCOPY;  Service: Gastroenterology;;   SHOULDER ARTHROSCOPY Right 05-23-2007   SIGMOID COLECTOMY FOR CANCER  1989   SPACE OAR INSTILLATION N/A 02/12/2019   Procedure: SPACE OAR INSTILLATION;  Surgeon: Marcine Matar, MD;  Location: [facility] ORS;  Service: Urology;  Laterality: N/A;   SPHINCTEROTOMY  12/13/2022   Procedure: SPHINCTEROTOMY;  Surgeon: Iva Boop, MD;  Location: [facility] ENDOSCOPY;  Service: Gastroenterology;;   Francine Graven REMOVAL  02/07/2023   Procedure: STENT REMOVAL;  Surgeon: Lemar Lofty., MD;  Location: [facility]COPY;  Service: Gastroenterology;;   TOTAL KNEE ARTHROPLASTY Left 05/23/2015   Procedure: LEFT TOTAL KNEE ARTHROPLASTY;  Surgeon: Eugenia Mcalpine, MD;  Location: [facility] ORS;  Service: Orthopedics;  Laterality: Left;   VERTICAL BANDED GASTROPLASTY  1986   VIDEO BRONCHOSCOPY N/A 10/30/2019   Procedure: VIDEO BRONCHOSCOPY WITH BRONICAL ALVEROLAR LAVAGE WITHOUT FLUORO;  Surgeon: [name], DO;  Location: [facility] ENDOSCOPY;  Service: Endoscopy;  Laterality: N/A;    Allergies: Patient has no known allergies.  Medications: Prior to Admission medications   Medication Sig Start Date End Date Taking? Authorizing Provider  acetaminophen (TYLENOL) 325 MG tablet Take 2 tablets (650 mg total) by mouth every 6 (six) hours as needed for mild pain (or Fever >/= 101). 01/13/23   Sheikh, Omair Latif, DO  allopurinol (ZYLOPRIM) 300 MG tablet Take 300 mg by mouth daily.    [provider]  amLODipine (NORVASC) 10 MG tablet Take 10 mg by mouth daily.    [provider]  apixaban (ELIQUIS) 5 MG TABS tablet Take 1 tablet (5 mg total) by mouth 2 (two) times daily. 02/23/23 09/20/23  Nafziger, Kandee Keen, NP  bisacodyl (DULCOLAX) 5 MG EC tablet Take 10 mg by mouth daily as needed for moderate constipation.    [provider]  capecitabine (XELODA) 500 MG tablet Take 3 tablets (1,500 mg total) by mouth 2 (two) times daily after a meal.  Take Monday-Friday. Take only days of radiation. 08/25/23   [name], MD  Carboxymethylcellulose Sod PF 0.5 % SOLN Apply 1 drop to eye in the morning, at noon, in the evening, and at bedtime.    [provider]  cyanocobalamin (VITAMIN B12) 500 MCG tablet Take 1 tablet by mouth 2 (two) times daily. 07/26/23   [provider]  eszopiclone 3 MG TABS Take 1 tablet (3 mg total) by mouth at bedtime. Take immediately before bedtime. 09/09/23   Nafziger, Kandee Keen, NP  fluticasone (FLONASE) 50 MCG/ACT nasal spray Place 2 sprays into both nostrils daily.    [provider]  glycerin adult 2 g suppository Place 1 suppository rectally as needed for constipation. 08/14/23   Ernie Avena, MD  ipratropium (ATROVENT) 0.03 % nasal spray Place 2 sprays into both nostrils every 12 (twelve) hours. Patient taking differently: Place 2 sprays into both nostrils 2 (two) times daily as needed for rhinitis. 05/03/22     lidocaine-prilocaine (EMLA) cream Apply 1 Application topically as needed. 12/31/22   [name], MD  LORazepam (ATIVAN) 0.5 MG tablet Take 1 tablet (0.5 mg total) by mouth every 8 (eight) hours as needed for anxiety. 09/29/23   Rana Snare, NP  mirabegron ER (MYRBETRIQ) 50 MG TB24 tablet Take 1 tablet by mouth daily. 06/21/23   [provider]  oxybutynin (DITROPAN) 5 MG tablet Take 5 mg by mouth 2 (two) times daily as needed (urinary incontinence).    [provider]  oxyCODONE (ROXICODONE) 5 MG immediate release tablet Take 1 tablet (5 mg total) by mouth every 6 (six) hours as needed for severe pain (pain score 7-10). 09/09/23   Nafziger, Kandee Keen, NP  polyethylene glycol powder (GLYCOLAX/MIRALAX) 17 GM/SCOOP powder Mix 4 capfuls in a 32 oz Gatorade, Can repeat the next day for a satisfactory bowel movement. THEN one capful daily for maintenance 08/14/23   Ernie Avena, MD  Potassium Gluconate 550 MG TABS Take 550 mg by mouth daily.    [provider]   predniSONE (DELTASONE) 10 MG tablet Take 1 tablet (10 mg total) by mouth daily with breakfast. 09/14/23   Rana Snare, NP  rosuvastatin (CRESTOR) 20 MG tablet Take 20 mg by mouth daily.    [provider]  Sodium Chloride Flush (NORMAL SALINE FLUSH) 0.9 % SOLN Inject 10 mLs into the vein daily. 09/04/23   Alene Mires, NP  sodium chloride HYPERTONIC 3 % nebulizer solution Take by nebulization as needed for other. 04/01/22   [name], NP  tamsulosin (FLOMAX) 0.4 MG CAPS capsule Take 1 capsule by mouth daily. 06/21/23   [provider]     Family History  Problem Relation Age of Onset   Stroke Paternal Grandfather    Heart disease Paternal Grandfather    Esophagitis Father        died from perforated esophagus   Breast cancer Mother    Colon cancer Maternal Grandmother        questionable   Esophageal cancer Neg Hx    Prostate cancer Neg Hx  Stomach cancer Neg Hx     Social History   Socioeconomic History   Marital status: Widowed    Spouse name: Not on file   Number of children: 4   Years of education: 79   Highest education level: Some college, no degree  Occupational History   Occupation: retired    Associate Professor: [employer]  Tobacco Use   Smoking status: Former    Current packs/day: 0.00    Average packs/day: 2.0 packs/day for 20.0 years (40.0 ttl pk-yrs)    Types: Cigarettes    Start date: 07/05/1954    Quit date: 07/05/1974    Years since quitting: 49.2   Smokeless tobacco: Never  Vaping Use   Vaping status: Never Used  Substance and Sexual Activity   Alcohol use: Yes    Comment: wine   Drug use: No   Sexual activity: Not Currently  Other Topics Concern   Not on file  Social History Narrative   Recently widowed   Former Smoker    Alcohol use-yes 1-2 drinks per day      Occupation: Retired Associate Professor      Originally from [place], [place] - in [place] > 10 yrs as of 2017      Social Drivers of Health   Financial Resource  Strain: Low Risk  (07/28/2021)   Overall Financial Resource Strain (CARDIA)    Difficulty of Paying Living Expenses: Not hard at all  Food Insecurity: No Food Insecurity (10/03/2023)   Hunger Vital Sign    Worried About Running Out of Food in the Last Year: Never true    Ran Out of Food in the Last Year: Never true  Transportation Needs: No Transportation Needs (10/03/2023)   PRAPARE - Administrator, Civil Service (Medical): No    Lack of Transportation (Non-Medical): No  Physical Activity: Inactive (07/28/2021)   Exercise Vital Sign    Days of Exercise per Week: 0 days    Minutes of Exercise per Session: 0 min  Stress: No Stress Concern Present (07/28/2021)   [employer] of Occupational Health - Occupational Stress Questionnaire    Feeling of Stress : Not at all  Social Connections: Moderately Isolated (10/03/2023)   Social Connection and Isolation Panel [NHANES]    Frequency of Communication with Friends and Family: Three times a week    Frequency of Social Gatherings with Friends and Family: More than three times a week    Attends Religious Services: More than 4 times per year    Active Member of Clubs or Organizations: No    Attends Banker Meetings: Never    Marital Status: Widowed     Review of Systems: A 12 point ROS discussed and pertinent positives are indicated in the HPI above.  All other systems are negative.  Review of Systems  Gastrointestinal:  Positive for abdominal pain.    Vital Signs: BP 133/61 (BP Location: Right Arm)   Pulse 65   Temp 98 F (36.7 C) (Oral)   Resp 16   Ht 5\' 8"  (1.727 m)   Wt 185 lb (83.9 kg)   SpO2 100%   BMI 28.13 kg/m   Advance Care Plan: No documents on file  Physical Exam Vitals and nursing note reviewed.  Constitutional:      General: He is not in acute distress.    Appearance: He is not toxic-appearing.  Cardiovascular:     Rate and Rhythm: Normal rate and regular rhythm.  Pulmonary:  Effort: Pulmonary effort is normal.     Breath sounds: Normal breath sounds.  Abdominal:     Palpations: Abdomen is soft.     Tenderness: There is abdominal tenderness in the right upper quadrant.  Skin:    General: Skin is warm and dry.  Neurological:     Mental Status: He is alert and oriented to person, place, and time.     Imaging: US Abdomen Limited RUQ (LIVER/GB) Result Date: 10/03/2023 CLINICAL DATA:  [id number] Thickening of wall of gallbladder [id number] Cholelithiasis [id number] EXAM: ULTRASOUND ABDOMEN LIMITED RIGHT UPPER QUADRANT COMPARISON:  US Abdomen, 01/11/2023. CT of pelvis, 10/03/2023 and 09/01/2023. FINDINGS: Suboptimal evaluation, with poor acoustic penetration secondary to patient habitus. Gallbladder: Dependent echogenic gallstone, within a nondistended gallbladder, measuring up to 2.3 cm. Gallbladder wall thickening, measuring 0.6 cm with mild pericholecystic fluid. No sonographic Murphy sign noted by sonographer. Common bile duct: Diameter: Poorly visualized. Liver: Increased hepatic parenchymal echogenicity. Ovoid, hypoechoic RIGHT liver lesion measuring 2.2 x 1.4 x 1.5 cm without internal vascularity likely consistent with a cysts. Portal vein is patent on color Doppler imaging with normal direction of blood flow towards the liver. Other: No perihepatic fluid. IMPRESSION: 1. Nondistended gallbladder with gallstone, relative wall thickening and pericholecystic fluid. Examination is equivocal for calculus cholecystitis, consider NM HIDA for further evaluation if continued clinical concern. 2. Echogenic liver. Findings most commonly seen in hepatic steatosis, though may also represent hepatitis and/or fibrosis. Electronically Signed   By: Roanna Banning M.D.   On: 10/03/2023 14:54   CT ABDOMEN PELVIS W CONTRAST Result Date: 10/03/2023 CLINICAL DATA:  Abdominal pain, acute, nonlocalized pancreatic cancer, hx of bilary stent, pain worse in ruq Right lower quadrant abdominal pain for  3 days with nausea. Recently completed radiation therapy. EXAM: CT ABDOMEN AND PELVIS WITH CONTRAST TECHNIQUE: Multidetector CT imaging of the abdomen and pelvis was performed using the standard protocol following bolus administration of intravenous contrast. RADIATION DOSE REDUCTION: This exam was performed according to the departmental dose-optimization program which includes automated exposure control, adjustment of the mA and/or kV according to patient size and/or use of iterative reconstruction technique. CONTRAST:  OMNIPAQUE IOHEXOL 300 MG/ML  SOLN COMPARISON:  Abdominopelvic CT 09/01/2023 and 08/14/2023. Abdominal radiographs 09/06/2023. FINDINGS: Lower chest: Chronic elevation of the left hemidiaphragm with associated chronic bibasilar atelectasis. The overall aeration of the left lung base has improved from the prior study. No significant pleural or pericardial effusion. Patient has a pacemaker. There is aortic and coronary artery atherosclerosis. Hepatobiliary: Grossly stable hepatic cysts. No suspicious liver lesions are identified. Metallic biliary stent is unchanged in position with persistent pneumobilia. Small gallstones, gallbladder wall thickening and pericholecystic inflammation, similar to previous CT. Since the prior CT, the patient had a cholecystostomy tube placed and removed. Pancreas: Grossly stable ill-defined 2.2 cm mass involving the pancreatic head on image 29/4. Persistent moderate pancreatic ductal dilatation. No acute peripancreatic fluid collections are identified. Spleen: Normal in size without focal abnormality. Adrenals/Urinary Tract: Both adrenal glands appear normal. No evidence of urinary tract calculus, suspicious renal lesion or hydronephrosis. Unchanged renal cysts for which no specific follow-up imaging is recommended. The bladder appears unremarkable for its degree of distention. Stomach/Bowel: No enteric contrast was administered. A duodenal stent is in place (placed  09/05/2023) and extending from the distal stomach into the 2nd portion of the duodenum, grossly unchanged from reference prior radiographs. There is fluid density within the stent lumen without soft tissue components to suggest tumor ingrowth. The stomach  remains decompressed with stable postsurgical changes proximally. No significant bowel distension, wall thickening or surrounding inflammation. The appendix appears normal. There is a patent distal colonic anastomosis. Vascular/Lymphatic: No enlarged abdominopelvic lymph nodes are identified. Diffuse aortic and branch vessel atherosclerosis without evidence of aneurysm or acute large vessel occlusion. There is chronic venous occlusion at the portal splenic confluence. Reproductive: Prostate brachytherapy seeds. Other: Postsurgical changes in the anterior abdominal wall. Generalized subcutaneous and mesenteric edema has mildly progressed compared with the prior CT. No ascites, focal fluid collection or pneumoperitoneum demonstrated. Musculoskeletal: No acute or significant osseous findings. Multilevel spondylosis. IMPRESSION: 1. Cholelithiasis with gallbladder wall thickening and pericholecystic inflammation, similar to previous CT. Since the prior CT, the patient had a cholecystostomy tube placed and removed. Findings could reflect ongoing cholecystitis. 2. Interval duodenal stent placement with decompression of the stomach. 3. Grossly stable ill-defined pancreatic head mass with associated pancreatic ductal dilatation and chronic venous occlusion. No definite evidence of metastatic disease. 4. Metallic biliary stent remains in place with persistent pneumobilia. 5. Generalized subcutaneous and mesenteric edema has mildly progressed compared with the prior CT. 6.  Aortic Atherosclerosis (ICD10-I70.0). Electronically Signed   By: Carey Bullocks M.D.   On: 10/03/2023 12:23   IR Sinus/Fist Tube Chk-Non GI Result Date: 09/22/2023 INDICATION: Retracted  cholecystostomy, no output EXAM: Fluoroscopic evaluation and injection of the existing cholecystostomy. Cholecystostomy removal MEDICATIONS: None. ANESTHESIA/SEDATION: None. COMPLICATIONS: None immediate. PROCEDURE: Informed written consent was obtained from the patient after a thorough discussion of the procedural risks, benefits and alternatives. All questions were addressed. Maximal Sterile Barrier Technique was utilized including caps, mask, sterile gowns, sterile gloves, sterile drape, hand hygiene and skin antiseptic. A timeout was performed prior to the initiation of the procedure. Existing right upper quadrant cholecystostomy is clearly retracted. Injection confirms the catheter has retracted and is within the periphery of the right liver. Catheter was cut and removed. Kumpe catheter was advanced into the percutaneous tract. Contrast injection demonstrates no residual patent transhepatic tract to the gallbladder. Catheter and guidewire access removed. Access could not be re-established into the gallbladder. Following this, ultrasound of the right upper quadrant demonstrates no significant dilatation of the gallbladder. Cholelithiasis noted. Diffuse pneumobilia as before related to the known endoscopic biliary stent. IMPRESSION: Retracted cholecystostomy within the periphery of the right liver. No residual patent percutaneous transhepatic tract to reestablish gallbladder access. Therefore the cholecystostomy was removed. Right upper quadrant limited ultrasound demonstrates no significant dilatation of the gallbladder. Gallstones are noted. No sonographic Murphy's sign. Findings discussed with the patient and his son. They understand if he has recurrent right upper quadrant pain, fevers, nausea and vomiting he may require cholecystostomy replacement in the future. They are in agreement with this plan. Electronically Signed   By: Judie Petit.  Shick M.D.   On: 09/22/2023 17:16   DG Abd 2 Views Result Date:  09/06/2023 CLINICAL DATA:  Nausea, vomiting. EXAM: ABDOMEN - 2 VIEW COMPARISON:  June 22, 2022. FINDINGS: No abnormal bowel dilatation is noted. Surgical sutures are noted in left upper quadrant. Percutaneous drainage catheter is seen laterally in right upper quadrant of uncertain etiology. Probable common bile duct and duodenal stents are noted. IMPRESSION: No abnormal bowel dilatation. Electronically Signed   By: Lupita Raider M.D.   On: 09/06/2023 13:14   DG C-Arm 1-60 Min-No Report Result Date: 09/05/2023 Fluoroscopy was utilized by the requesting physician.  No radiographic interpretation.    Labs:  CBC: Recent Labs    09/07/23 0226 09/14/23 0903 09/29/23  1412 10/03/23 0944  WBC 3.6* 3.8* 2.8* 2.9*  HGB 8.8* 9.1* 9.3* 9.1*  HCT 28.8* 28.2* 29.7* 28.5*  PLT 154 203 167 139*    COAGS: Recent Labs    12/13/22 1727 12/14/22 0037 01/09/23 1635 09/01/23 2120 09/02/23 1747 09/03/23 0404 09/04/23 0101 09/04/23 1041 09/05/23 0500 09/06/23 0340  INR 1.6* 1.4* 1.3*  --  1.4*  --   --   --   --   --   APTT  --   --   --    < >  --    < > 90* 94* 99* 63*   < > = values in this interval not displayed.    BMP: Recent Labs    09/07/23 0226 09/14/23 0903 09/29/23 1412 10/03/23 0944  NA 135 138 139 136  K 3.4* 3.8 3.9 3.3*  CL 105 106 107 107  CO2 23 26 27 22   GLUCOSE 110* 127* 119* 113*  BUN 15 17 23  25*  CALCIUM 7.8* 9.0 8.8* 8.6*  CREATININE 1.18 1.04 0.95 1.06  GFRNONAA >60 >60 >60 >60    LIVER FUNCTION TESTS: Recent Labs    09/07/23 0226 09/14/23 0903 09/29/23 1412 10/03/23 0944  BILITOT 0.6 0.4 0.3 0.6  AST 11* 13* 15 21  ALT 11 9 10 14   ALKPHOS 55 62 51 54  PROT 5.2* 6.2* 6.0* 6.0*  ALBUMIN 2.2* 3.3* 3.3* 2.9*    TUMOR MARKERS: No results for input(s): "AFPTM", "CEA", "CA199", "CHROMGRNA" in the last 8760 hours.  Assessment and Plan:   83 y.o. male with hx of pancreatic cancer. Known cholecystitis and previous cholecystostomy tube with  recurrent worsening abdominal pain for the last several days.  Dr. Milford Cage consulted and reviewed case and CT imaging.  Ordered RUQ [place] and HIDA scan for repeat evaluation.  Dr. Milford Cage reviewed US imaging and not indicative of acute cholecystitis.   HIDA scan ordered and will be performed first thing tomorrow morning per nuclear medicine, given pain medications patient received in ED.  Plan to await HIDA results and potential repeat cholecystostomy. Will continue to follow.  Thank you for allowing our service to participate in Marcus [name] 's care.  Electronically Signed: Katheren Puller, PA-C   10/03/2023, 4:45 PM      I spent a total of 40 Minutes    in face to face in clinical consultation, greater than 50% of which was counseling/coordinating care for chole eval with possible repeat cholecystostomy.

## 2023-10-03 NOTE — Progress Notes (Signed)
 Pharmacy Antibiotic Note  Marcus [name] is a 83 y.o. male admitted on 10/03/2023 with  intra-abdominal infection .  PMH significant for pancreatic cancer.   Pharmacy has been consulted for piperacillin/tazobactam dosing.  Today, 10/03/23 Afebrile, WBC slightly low  Plan: Piperacillin/tazobactam 3.375 g IV q8h EI Monitor renal function  Height: 5\' 8"  (172.7 cm) Weight: 83.9 kg (185 lb) IBW/kg (Calculated) : 68.4  Temp (24hrs), Avg:97.9 F (36.6 C), Min:97.9 F (36.6 C), Max:97.9 F (36.6 C)  Recent Labs  Lab 09/29/23 1412 10/03/23 0944 10/03/23 1146  WBC 2.8* 2.9*  --   CREATININE 0.95 1.06  --   LATICACIDVEN  --   --  0.6    Estimated Creatinine Clearance: 55.7 mL/min (by C-G formula based on SCr of 1.06 mg/dL).    No Known Allergies  Antimicrobials this admission: Piperacillin/tazobactam 3/31 >>   Dose adjustments this admission:  Cindi Carbon, PharmD 10/03/2023 2:16 PM

## 2023-10-03 NOTE — H&P (Addendum)
 History and Physical  Marcus [name] ZOX:[id number] DOB: [birth year] DOA: 10/03/2023  PCP: [name], NP   Chief Complaint: abdominal pain   HPI: Marcus [name] is a 83 y.o. male with medical history significant for GERD, OSA, pancreatic cancer and biliary stent as well as known cholecystitis with recent percutaneous drain being admitted to the hospital with persistent cholecystitis after drain malfunction.  He was diagnosed with pancreatic mass May 2024, had ERCP in June 2024 with stent placed, has evidence of liver metastasis.  Most recently was being treated with Xeloda and radiation, decided to discontinue this in the last week as he was not tolerating treatment very well.  He presented to the hospital earlier this month with intractable nausea vomiting, abdominal distention was found to have cholelithiasis with evidence of acute cholecystitis.  He had percutaneous cholecystostomy tube placed 09/03/2023, after general surgery felt he was not a surgical candidate due to pancreatic cancer with evidence of progressive disease.  He also had a duodenal stent placed during that hospital stay due to gastric outlet obstruction.  On 3/20, his cholecystostomy tube was removed by radiology, as it was found to be retracted and barely in the liver.  In the last 24 hours, patient has had recurrent nausea without vomiting, as well as right lower quadrant abdominal pain and some distention.  He denies any fevers, chest pain, shortness of breath or any other concerns.  CT scan and workup as detailed below shows evidence of continued cholecystitis, IR has been consulted for tube replacement.  Review of Systems: Please see HPI for pertinent positives and negatives. A complete 10 system review of systems are otherwise negative.  Past Medical History:  Diagnosis Date   Acute meniscal tear of knee left   Arthritis    generalized   AV block 08/01/2018   Benign essential hypertension 01/18/2007   Bronchiectasis  with acute exacerbation 06/23/2020   Chest pain, atypical 08/23/2014   Chronic cough 10/07/2014   Coronary artery disease    Degeneration of lumbar intervertebral disc 10/14/2017   Diverticulosis of colon 07/07/2005   Elevated PSA 06/19/2014   GERD (gastroesophageal reflux disease) 01/18/2007   Gout, unspecified 01/18/2007   Hemorrhoids 01/19/2011   Hiatal hernia    History of colonic polyps 01/18/2007   Insomnia, unspecified 08/21/2007   Iron deficiency anemia, unspecified  01/19/2011   Lumbar post-laminectomy syndrome 10/14/2017   Lumbar spondylolysis    Lumbar strain 12/14/2011   Obstructive sleep apnea 01/18/2007   uses CPAP nightly   Paraesophageal hernia    large   Primary osteoarthritis of knee 05/23/2015   Prostate cancer (HCC) 2016   S/P knee replacement 05/23/2015   Sensorineural hearing loss, bilateral    Vitamin B12 deficiency    Vitamin D deficiency 10/28/2009   Past Surgical History:  Procedure Laterality Date   BILIARY BRUSHING  12/13/2022   Procedure: BILIARY BRUSHING;  Surgeon: Iva Boop, MD;  Location: [facility] ENDOSCOPY;  Service: Gastroenterology;;   BILIARY STENT PLACEMENT  12/13/2022   Procedure: BILIARY STENT PLACEMENT;  Surgeon: Iva Boop, MD;  Location: [facility] ENDOSCOPY;  Service: Gastroenterology;;   BILIARY STENT PLACEMENT N/A 02/07/2023   Procedure: BILIARY STENT PLACEMENT;  Surgeon: Lemar Lofty., MD;  Location: [facility] ENDOSCOPY;  Service: Gastroenterology;  Laterality: N/A;   BIOPSY  12/23/2022   Procedure: BIOPSY;  Surgeon: Lemar Lofty., MD;  Location: [facility] ENDOSCOPY;  Service: Gastroenterology;;   BIOPSY  09/05/2023   Procedure: BIOPSY;  Surgeon: Lemar Lofty.,  MD;  Location: [facility] ENDOSCOPY;  Service: Gastroenterology;;   BRONCHIAL WASHINGS  10/30/2019   Procedure: BRONCHIAL WASHINGS;  Surgeon: [name], DO;  Location: [facility] ENDOSCOPY;  Service: Endoscopy;;   CARDIAC CATHETERIZATION     CATARACT EXTRACTION W/ INTRAOCULAR  LENS  IMPLANT, BILATERAL Bilateral    COLONOSCOPY     DUODENAL STENT PLACEMENT N/A 09/05/2023   Procedure: INSERTION, STENT, DUODENUM;  Surgeon: Lemar Lofty., MD;  Location: [facility] ENDOSCOPY;  Service: Gastroenterology;  Laterality: N/A;   ENDOSCOPIC RETROGRADE CHOLANGIOPANCREATOGRAPHY (ERCP) WITH PROPOFOL N/A 02/07/2023   Procedure: ENDOSCOPIC RETROGRADE CHOLANGIOPANCREATOGRAPHY (ERCP) WITH PROPOFOL;  Surgeon: Meridee Score Netty Starring., MD;  Location: [facility] ENDOSCOPY;  Service: Gastroenterology;  Laterality: N/A;  Stent change   ERCP N/A 12/13/2022   Procedure: ENDOSCOPIC RETROGRADE CHOLANGIOPANCREATOGRAPHY (ERCP);  Surgeon: Iva Boop, MD;  Location: [facility] ENDOSCOPY;  Service: Gastroenterology;  Laterality: N/A;  with stent placement   ESOPHAGOGASTRODUODENOSCOPY     ESOPHAGOGASTRODUODENOSCOPY (EGD) WITH PROPOFOL N/A 12/23/2022   Procedure: ESOPHAGOGASTRODUODENOSCOPY (EGD) WITH PROPOFOL;  Surgeon: Meridee Score Netty Starring., MD;  Location: [facility] ENDOSCOPY;  Service: Gastroenterology;  Laterality: N/A;   ESOPHAGOGASTRODUODENOSCOPY (EGD) WITH PROPOFOL N/A 09/05/2023   Procedure: ESOPHAGOGASTRODUODENOSCOPY (EGD) WITH PROPOFOL;  Surgeon: Meridee Score Netty Starring., MD;  Location: [facility] ENDOSCOPY;  Service: Gastroenterology;  Laterality: N/A;   EUS N/A 12/23/2022   Procedure: UPPER ENDOSCOPIC ULTRASOUND (EUS) RADIAL;  Surgeon: Lemar Lofty., MD;  Location: [facility] ENDOSCOPY;  Service: Gastroenterology;  Laterality: N/A;   FINE NEEDLE ASPIRATION  12/23/2022   Procedure: FINE NEEDLE ASPIRATION (FNA) RADIAL;  Surgeon: Lemar Lofty., MD;  Location: [facility] ENDOSCOPY;  Service: Gastroenterology;;   IR IMAGING GUIDED PORT INSERTION  01/04/2023   IR PERC CHOLECYSTOSTOMY  09/03/2023   IR SINUS/FIST TUBE CHK-NON GI  09/22/2023   KNEE ARTHROSCOPY Right 2007   KNEE ARTHROSCOPY  07/13/2011   Procedure: ARTHROSCOPY KNEE;  Surgeon: Erasmo Leventhal;  Location: [facility];  Service: Orthopedics;   Laterality: Left;  partial menisectomy with chondrylplasty   KNEE SURGERY Left    LAMINECTOMY AND MICRODISCECTOMY LUMBAR SPINE  MARCH  2008   L3 -  4   LAPAROSCOPIC INCISIONAL / UMBILICAL / VENTRAL HERNIA REPAIR  2006   PACEMAKER IMPLANT N/A 08/02/2018   Procedure: PACEMAKER IMPLANT;  Surgeon: Marinus Maw, MD;  Location: [facility] INVASIVE CV LAB;  Service: Cardiovascular;  Laterality: N/A;   PROSTATE BIOPSY     RADIOACTIVE SEED IMPLANT N/A 02/12/2019   Procedure: RADIOACTIVE SEED IMPLANT/BRACHYTHERAPY IMPLANT;  Surgeon: Marcine Matar, MD;  Location: [facility] ORS;  Service: Urology;  Laterality: N/A;  90 MINS   REMOVAL OF STONES  02/07/2023   Procedure: REMOVAL OF Sludge;  Surgeon: Meridee Score Netty Starring., MD;  Location: [facility] ENDOSCOPY;  Service: Gastroenterology;;   SHOULDER ARTHROSCOPY Right 05-23-2007   SIGMOID COLECTOMY FOR CANCER  1989   SPACE OAR INSTILLATION N/A 02/12/2019   Procedure: SPACE OAR INSTILLATION;  Surgeon: Marcine Matar, MD;  Location: [facility] ORS;  Service: Urology;  Laterality: N/A;   SPHINCTEROTOMY  12/13/2022   Procedure: SPHINCTEROTOMY;  Surgeon: Iva Boop, MD;  Location: [facility] ENDOSCOPY;  Service: Gastroenterology;;   Francine Graven REMOVAL  02/07/2023   Procedure: STENT REMOVAL;  Surgeon: Lemar Lofty., MD;  Location: [facility] ENDOSCOPY;  Service: Gastroenterology;;   TOTAL KNEE ARTHROPLASTY Left 05/23/2015   Procedure: LEFT TOTAL KNEE ARTHROPLASTY;  Surgeon: Eugenia Mcalpine, MD;  Location: [facility] ORS;  Service: Orthopedics;  Laterality: Left;   VERTICAL BANDED GASTROPLASTY  1986   VIDEO BRONCHOSCOPY  N/A 10/30/2019   Procedure: VIDEO BRONCHOSCOPY WITH BRONICAL ALVEROLAR LAVAGE WITHOUT FLUORO;  Surgeon: [name], DO;  Location: [facility] ENDOSCOPY;  Service: Endoscopy;  Laterality: N/A;   Social History:  reports that he quit smoking about 49 years ago. His smoking use included cigarettes. He started smoking about 69 years ago. He has a 40 pack-year smoking history. He has never used  smokeless tobacco. He reports current alcohol use. He reports that he does not use drugs.  No Known Allergies  Family History  Problem Relation Age of Onset   Stroke Paternal Grandfather    Heart disease Paternal Grandfather    Esophagitis Father        died from perforated esophagus   Breast cancer Mother    Colon cancer Maternal Grandmother        questionable   Esophageal cancer Neg Hx    Prostate cancer Neg Hx    Stomach cancer Neg Hx      Prior to Admission medications   Medication Sig Start Date End Date Taking? Authorizing Provider  acetaminophen (TYLENOL) 325 MG tablet Take 2 tablets (650 mg total) by mouth every 6 (six) hours as needed for mild pain (or Fever >/= 101). 01/13/23   Sheikh, Omair Latif, DO  allopurinol (ZYLOPRIM) 300 MG tablet Take 300 mg by mouth daily.    [provider]  amLODipine (NORVASC) 10 MG tablet Take 10 mg by mouth daily.    [provider]  apixaban (ELIQUIS) 5 MG TABS tablet Take 1 tablet (5 mg total) by mouth 2 (two) times daily. 02/23/23 09/20/23  Nafziger, Kandee Keen, NP  bisacodyl (DULCOLAX) 5 MG EC tablet Take 10 mg by mouth daily as needed for moderate constipation.    [provider]  capecitabine (XELODA) 500 MG tablet Take 3 tablets (1,500 mg total) by mouth 2 (two) times daily after a meal. Take Monday-Friday. Take only days of radiation. 08/25/23   [name], MD  Carboxymethylcellulose Sod PF 0.5 % SOLN Apply 1 drop to eye in the morning, at noon, in the evening, and at bedtime.    [provider]  cyanocobalamin (VITAMIN B12) 500 MCG tablet Take 1 tablet by mouth 2 (two) times daily. 07/26/23   [provider]  eszopiclone 3 MG TABS Take 1 tablet (3 mg total) by mouth at bedtime. Take immediately before bedtime. 09/09/23   Nafziger, Kandee Keen, NP  fluticasone (FLONASE) 50 MCG/ACT nasal spray Place 2 sprays into both nostrils daily.    [provider]  glycerin adult 2 g suppository Place 1  suppository rectally as needed for constipation. 08/14/23   Ernie Avena, MD  ipratropium (ATROVENT) 0.03 % nasal spray Place 2 sprays into both nostrils every 12 (twelve) hours. Patient taking differently: Place 2 sprays into both nostrils 2 (two) times daily as needed for rhinitis. 05/03/22     lidocaine-prilocaine (EMLA) cream Apply 1 Application topically as needed. 12/31/22   [name], MD  LORazepam (ATIVAN) 0.5 MG tablet Take 1 tablet (0.5 mg total) by mouth every 8 (eight) hours as needed for anxiety. 09/29/23   Rana Snare, NP  mirabegron ER (MYRBETRIQ) 50 MG TB24 tablet Take 1 tablet by mouth daily. 06/21/23   [provider]  oxybutynin (DITROPAN) 5 MG tablet Take 5 mg by mouth 2 (two) times daily as needed (urinary incontinence).    [provider]  oxyCODONE (ROXICODONE) 5 MG immediate release tablet Take 1 tablet (5 mg total) by mouth every  6 (six) hours as needed for severe pain (pain score 7-10). 09/09/23   Nafziger, Kandee Keen, NP  polyethylene glycol powder (GLYCOLAX/MIRALAX) 17 GM/SCOOP powder Mix 4 capfuls in a 32 oz Gatorade, Can repeat the next day for a satisfactory bowel movement. THEN one capful daily for maintenance 08/14/23   Ernie Avena, MD  Potassium Gluconate 550 MG TABS Take 550 mg by mouth daily.    [provider]  predniSONE (DELTASONE) 10 MG tablet Take 1 tablet (10 mg total) by mouth daily with breakfast. 09/14/23   Rana Snare, NP  Respiratory Therapy Supplies (FLUTTER) DEVI 1 each by Does not apply route in the morning and at bedtime. 10/11/19   [name], DO  rosuvastatin (CRESTOR) 20 MG tablet Take 20 mg by mouth daily.    [provider]  Sodium Chloride Flush (NORMAL SALINE FLUSH) 0.9 % SOLN Inject 10 mLs into the vein daily. 09/04/23   Alene Mires, NP  sodium chloride HYPERTONIC 3 % nebulizer solution Take by nebulization as needed for other. 04/01/22   [name], NP  tamsulosin (FLOMAX) 0.4 MG CAPS  capsule Take 1 capsule by mouth daily. 06/21/23   [provider]    Physical Exam: BP 136/79   Pulse 75   Temp 97.9 F (36.6 C) (Oral)   Resp 18   Ht 5\' 8"  (1.727 m)   Wt 83.9 kg   SpO2 100%   BMI 28.13 kg/m  General:  Alert, oriented, calm, in no acute distress, his daughter is at the bedside Cardiovascular: RRR, no murmurs or rubs, no peripheral edema  Respiratory: clear to auscultation bilaterally, no wheezes, no crackles  Abdomen: soft, minimally tender specially in the right lower quadrant, mildly distended, normal bowel tones heard  Skin: dry, no rashes  Musculoskeletal: no joint effusions, normal range of motion  Psychiatric: appropriate affect, normal speech  Neurologic: extraocular muscles intact, clear speech, moving all extremities with intact sensorium         Labs on Admission:  Basic Metabolic Panel: Recent Labs  Lab 09/29/23 1412 10/03/23 0944  NA 139 136  K 3.9 3.3*  CL 107 107  CO2 27 22  GLUCOSE 119* 113*  BUN 23 25*  CREATININE 0.95 1.06  CALCIUM 8.8* 8.6*   Liver Function Tests: Recent Labs  Lab 09/29/23 1412 10/03/23 0944  AST 15 21  ALT 10 14  ALKPHOS 51 54  BILITOT 0.3 0.6  PROT 6.0* 6.0*  ALBUMIN 3.3* 2.9*   Recent Labs  Lab 10/03/23 0944  LIPASE 23   No results for input(s): "AMMONIA" in the last 168 hours. CBC: Recent Labs  Lab 09/29/23 1412 10/03/23 0944  WBC 2.8* 2.9*  NEUTROABS 1.9  --   HGB 9.3* 9.1*  HCT 29.7* 28.5*  MCV 84.6 85.3  PLT 167 139*   Cardiac Enzymes: No results for input(s): "CKTOTAL", "CKMB", "CKMBINDEX", "TROPONINI" in the last 168 hours. BNP (last 3 results) Recent Labs    01/09/23 1812 01/13/23 0345 02/28/23 0030  BNP 282.4* 114.0* 60.0    ProBNP (last 3 results) No results for input(s): "PROBNP" in the last 8760 hours.  CBG: No results for input(s): "GLUCAP" in the last 168 hours.  Radiological Exams on Admission: CT ABDOMEN PELVIS W CONTRAST Result Date:  10/03/2023 CLINICAL DATA:  Abdominal pain, acute, nonlocalized pancreatic cancer, hx of bilary stent, pain worse in ruq Right lower quadrant abdominal pain for 3 days with nausea. Recently completed radiation therapy. EXAM: CT ABDOMEN  AND PELVIS WITH CONTRAST TECHNIQUE: Multidetector CT imaging of the abdomen and pelvis was performed using the standard protocol following bolus administration of intravenous contrast. RADIATION DOSE REDUCTION: This exam was performed according to the departmental dose-optimization program which includes automated exposure control, adjustment of the mA and/or kV according to patient size and/or use of iterative reconstruction technique. CONTRAST:  OMNIPAQUE IOHEXOL 300 MG/ML  SOLN COMPARISON:  Abdominopelvic CT 09/01/2023 and 08/14/2023. Abdominal radiographs 09/06/2023. FINDINGS: Lower chest: Chronic elevation of the left hemidiaphragm with associated chronic bibasilar atelectasis. The overall aeration of the left lung base has improved from the prior study. No significant pleural or pericardial effusion. Patient has a pacemaker. There is aortic and coronary artery atherosclerosis. Hepatobiliary: Grossly stable hepatic cysts. No suspicious liver lesions are identified. Metallic biliary stent is unchanged in position with persistent pneumobilia. Small gallstones, gallbladder wall thickening and pericholecystic inflammation, similar to previous CT. Since the prior CT, the patient had a cholecystostomy tube placed and removed. Pancreas: Grossly stable ill-defined 2.2 cm mass involving the pancreatic head on image 29/4. Persistent moderate pancreatic ductal dilatation. No acute peripancreatic fluid collections are identified. Spleen: Normal in size without focal abnormality. Adrenals/Urinary Tract: Both adrenal glands appear normal. No evidence of urinary tract calculus, suspicious renal lesion or hydronephrosis. Unchanged renal cysts for which no specific follow-up imaging is  recommended. The bladder appears unremarkable for its degree of distention. Stomach/Bowel: No enteric contrast was administered. A duodenal stent is in place (placed 09/05/2023) and extending from the distal stomach into the 2nd portion of the duodenum, grossly unchanged from reference prior radiographs. There is fluid density within the stent lumen without soft tissue components to suggest tumor ingrowth. The stomach remains decompressed with stable postsurgical changes proximally. No significant bowel distension, wall thickening or surrounding inflammation. The appendix appears normal. There is a patent distal colonic anastomosis. Vascular/Lymphatic: No enlarged abdominopelvic lymph nodes are identified. Diffuse aortic and branch vessel atherosclerosis without evidence of aneurysm or acute large vessel occlusion. There is chronic venous occlusion at the portal splenic confluence. Reproductive: Prostate brachytherapy seeds. Other: Postsurgical changes in the anterior abdominal wall. Generalized subcutaneous and mesenteric edema has mildly progressed compared with the prior CT. No ascites, focal fluid collection or pneumoperitoneum demonstrated. Musculoskeletal: No acute or significant osseous findings. Multilevel spondylosis. IMPRESSION: 1. Cholelithiasis with gallbladder wall thickening and pericholecystic inflammation, similar to previous CT. Since the prior CT, the patient had a cholecystostomy tube placed and removed. Findings could reflect ongoing cholecystitis. 2. Interval duodenal stent placement with decompression of the stomach. 3. Grossly stable ill-defined pancreatic head mass with associated pancreatic ductal dilatation and chronic venous occlusion. No definite evidence of metastatic disease. 4. Metallic biliary stent remains in place with persistent pneumobilia. 5. Generalized subcutaneous and mesenteric edema has mildly progressed compared with the prior CT. 6.  Aortic Atherosclerosis (ICD10-I70.0).  Electronically Signed   By: Carey Bullocks M.D.   On: 10/03/2023 12:23   Assessment/Plan Marcus [name] is a 83 y.o. male with medical history significant for GERD, OSA, pancreatic cancer and biliary stent as well as known cholecystitis with recent percutaneous drain being admitted to the hospital with persistent cholecystitis after drain malfunction.  Chronic cholecystitis-diagnosed earlier this month as stated above, had cholecystostomy tube which came out around 3/20.  No clear tract and so it was left out.  He now has recurrent abdominal pain and nausea, evidence of continued cholecystitis. -Inpatient admission -Empiric IV Zosyn -Keep n.p.o. for now -Pain and nausea medication as needed -  IR has been consulted, anticipate they will replace his cholecystostomy tube  Atrial fibrillation-on Eliquis at home, I will hold this for the time being as he is getting ready to have his cholecystostomy tube replaced  Hypertension-continue amlodipine  Hyperlipidemia-Crestor  DVT prophylaxis: Lovenox for now until procedure is completed, can then resume Eliquis    Code Status: Limited: Do not attempt resuscitation (DNR) -DNR-LIMITED -Do Not Intubate/DNI   Consults called: None  Admission status: The appropriate patient status for this patient is INPATIENT. Inpatient status is judged to be reasonable and necessary in order to provide the required intensity of service to ensure the patient's safety. The patient's presenting symptoms, physical exam findings, and initial radiographic and laboratory data in the context of their chronic comorbidities is felt to place them at high risk for further clinical deterioration. Furthermore, it is not anticipated that the patient will be medically stable for discharge from the hospital within 2 midnights of admission.    I certify that at the point of admission it is my clinical judgment that the patient will require inpatient hospital care spanning beyond 2  midnights from the point of admission due to high intensity of service, high risk for further deterioration and high frequency of surveillance required  Time spent: 59 minutes  Averill Winters Sharlette Dense MD [facility] Pager [phone]  If 7PM-7AM, please contact night-coverage [email or web address] Password [email or web address]  10/03/2023, 1:44 PM

## 2023-10-03 NOTE — Plan of Care (Signed)
  Problem: Education: Goal: Knowledge of General Education information will improve Description: Including pain rating scale, medication(s)/side effects and non-pharmacologic comfort measures Outcome: Progressing   Problem: Activity: Goal: Risk for activity intolerance will decrease Outcome: Progressing   Problem: Safety: Goal: Ability to remain free from injury will improve 10/03/2023 2311 by Josph Macho, RN Outcome: Progressing 10/03/2023 [id number] by Josph Macho, RN Outcome: Progressing

## 2023-10-04 ENCOUNTER — Ambulatory Visit: Payer: Non-veteran care

## 2023-10-04 ENCOUNTER — Inpatient Hospital Stay (HOSPITAL_COMMUNITY)

## 2023-10-04 DIAGNOSIS — R109 Unspecified abdominal pain: Secondary | ICD-10-CM | POA: Diagnosis not present

## 2023-10-04 DIAGNOSIS — I10 Essential (primary) hypertension: Secondary | ICD-10-CM

## 2023-10-04 DIAGNOSIS — D61818 Other pancytopenia: Secondary | ICD-10-CM | POA: Diagnosis not present

## 2023-10-04 DIAGNOSIS — I482 Chronic atrial fibrillation, unspecified: Secondary | ICD-10-CM

## 2023-10-04 DIAGNOSIS — C259 Malignant neoplasm of pancreas, unspecified: Secondary | ICD-10-CM | POA: Diagnosis not present

## 2023-10-04 DIAGNOSIS — D649 Anemia, unspecified: Secondary | ICD-10-CM

## 2023-10-04 DIAGNOSIS — D72819 Decreased white blood cell count, unspecified: Secondary | ICD-10-CM | POA: Diagnosis not present

## 2023-10-04 DIAGNOSIS — K810 Acute cholecystitis: Secondary | ICD-10-CM | POA: Diagnosis not present

## 2023-10-04 DIAGNOSIS — D696 Thrombocytopenia, unspecified: Secondary | ICD-10-CM

## 2023-10-04 HISTORY — PX: IR PERC CHOLECYSTOSTOMY: IMG2326

## 2023-10-04 LAB — BLOOD GAS, ARTERIAL
Acid-base deficit: 2 mmol/L (ref 0.0–2.0)
Bicarbonate: 22.3 mmol/L (ref 20.0–28.0)
FIO2: 100 %
O2 Saturation: 100 %
Patient temperature: 36.9
pCO2 arterial: 36 mmHg (ref 32–48)
pH, Arterial: 7.4 (ref 7.35–7.45)
pO2, Arterial: 188 mmHg — ABNORMAL HIGH (ref 83–108)

## 2023-10-04 LAB — BASIC METABOLIC PANEL WITH GFR
Anion gap: 8 (ref 5–15)
BUN: 22 mg/dL (ref 8–23)
CO2: 23 mmol/L (ref 22–32)
Calcium: 8.3 mg/dL — ABNORMAL LOW (ref 8.9–10.3)
Chloride: 106 mmol/L (ref 98–111)
Creatinine, Ser: 1.17 mg/dL (ref 0.61–1.24)
GFR, Estimated: >60 mL/min (ref >60)
Glucose, Bld: 93 mg/dL (ref 70–99)
Potassium: 3.1 mmol/L — ABNORMAL LOW (ref 3.5–5.1)
Sodium: 137 mmol/L (ref 135–145)

## 2023-10-04 LAB — CBC
HCT: 28 % — ABNORMAL LOW (ref 39.0–52.0)
Hemoglobin: 8.7 g/dL — ABNORMAL LOW (ref 13.0–17.0)
MCH: 27.6 pg (ref 26.0–34.0)
MCHC: 31.1 g/dL (ref 30.0–36.0)
MCV: 88.9 fL (ref 80.0–100.0)
Platelets: 118 10*3/uL — ABNORMAL LOW (ref 150–400)
RBC: 3.15 MIL/uL — ABNORMAL LOW (ref 4.22–5.81)
RDW: 18.7 % — ABNORMAL HIGH (ref 11.5–15.5)
WBC: 2.5 10*3/uL — ABNORMAL LOW (ref 4.0–10.5)
nRBC: 0 % (ref 0.0–0.2)

## 2023-10-04 LAB — GLUCOSE, CAPILLARY: Glucose-Capillary: 88 mg/dL (ref 70–99)

## 2023-10-04 LAB — PROTIME-INR
INR: 1.4 — ABNORMAL HIGH (ref 0.8–1.2)
Prothrombin Time: 17 s — ABNORMAL HIGH (ref 11.4–15.2)

## 2023-10-04 LAB — MRSA NEXT GEN BY PCR, NASAL: MRSA by PCR Next Gen: NOT DETECTED

## 2023-10-04 MED ORDER — FENTANYL CITRATE (PF) 100 MCG/2ML IJ SOLN
INTRAMUSCULAR | Status: AC
  Filled 2023-10-04: qty 2

## 2023-10-04 MED ORDER — MIDAZOLAM HCL 2 MG/2ML IJ SOLN
INTRAMUSCULAR | Status: AC | PRN
Start: 2023-10-04 — End: 2023-10-04
  Administered 2023-10-04 (×2): 1 mg via INTRAVENOUS

## 2023-10-04 MED ORDER — SODIUM CHLORIDE 0.9% FLUSH
5.0000 mL | Freq: Three times a day (TID) | INTRAVENOUS | Status: DC
  Administered 2023-10-04 – 2023-10-10 (×18): 5 mL

## 2023-10-04 MED ORDER — ORAL CARE MOUTH RINSE: 15.0000 mL | OROMUCOSAL | Status: DC | PRN

## 2023-10-04 MED ORDER — LIDOCAINE-EPINEPHRINE 1 %-1:100000 IJ SOLN
INTRAMUSCULAR | Status: AC
  Filled 2023-10-04: qty 1

## 2023-10-04 MED ORDER — HYDROMORPHONE HCL 1 MG/ML IJ SOLN
INTRAMUSCULAR | Status: AC
  Filled 2023-10-04: qty 1

## 2023-10-04 MED ORDER — IOHEXOL 300 MG/ML  SOLN
50.0000 mL | Freq: Once | INTRAMUSCULAR | Status: AC | PRN
  Administered 2023-10-04: 20 mL

## 2023-10-04 MED ORDER — MORPHINE SULFATE (PF) 4 MG/ML IV SOLN
3.0000 mg | Freq: Once | INTRAVENOUS | Status: AC
  Administered 2023-10-05: 3 mg via INTRAVENOUS
  Filled 2023-10-04: qty 1

## 2023-10-04 MED ORDER — TECHNETIUM TC 99M MEBROFENIN IV KIT
5.3400 | PACK | Freq: Once | INTRAVENOUS | Status: AC
  Administered 2023-10-04: 5.34 via INTRAVENOUS

## 2023-10-04 MED ORDER — FENTANYL CITRATE (PF) 100 MCG/2ML IJ SOLN
INTRAMUSCULAR | Status: AC | PRN
Start: 2023-10-04 — End: 2023-10-04
  Administered 2023-10-04 (×2): 50 ug via INTRAVENOUS

## 2023-10-04 MED ORDER — HYDROMORPHONE HCL 1 MG/ML IJ SOLN
INTRAMUSCULAR | Status: AC | PRN
  Administered 2023-10-04: 1 mg via INTRAVENOUS

## 2023-10-04 MED ORDER — MORPHINE SULFATE (PF) 2 MG/ML IV SOLN
INTRAVENOUS | Status: AC
  Filled 2023-10-04: qty 2

## 2023-10-04 MED ORDER — MIDAZOLAM HCL 2 MG/2ML IJ SOLN
INTRAMUSCULAR | Status: AC
  Filled 2023-10-04: qty 2

## 2023-10-04 MED ORDER — LIDOCAINE-EPINEPHRINE 1 %-1:100000 IJ SOLN
20.0000 mL | Freq: Once | INTRAMUSCULAR | Status: AC
  Administered 2023-10-04: 20 mL via INTRADERMAL

## 2023-10-04 NOTE — Progress Notes (Addendum)
 Marcus [name]   DOB:[birth year]   WU#:[id number]      ASSESSMENT & PLAN:  Pancreatic cancer - Diagnosed 12/03/2022. - Started on chemotherapy regimen with gemcitabine and Abraxane on 01/07/2023.  Completed cycle 12 on 06/24/2023.   -Subsequently planned for radiation therapy plus concurrent Xeloda. - Medical oncology/Dr. Truett Perna following  Abdominal pain - Likely related to malignancy - Patient had hepatobiliary/HIDA scan done earlier today.  Results remain pending - Continue judicious pain management  Pancytopenia: Leukopenia - WBC low 2.5 today. - Continue to monitor CBC with differential  Anemia - Hemoglobin low 8.7 today.  Baseline hemoglobin appears to be in the 8-9 range. - No transfusional intervention required at this time - Continue to monitor CBC with differential  Thrombocytopenia - Mild - Platelets low 118K today. - No transfusional intervention at this time  A-fib Hypertension - Continue to monitor blood pressure and vitals closely - Medicine following    Code Status DNR-limited  Subjective:  Patient seen awake and alert laying supine in bed.  Patient's family members at bedside.  Reports that abdominal pain is much better and has been relieved with pain meds.  Had HIDA scan earlier today and wants to know the results, informed patient and family results not available as yet.  No other acute distress is noted  Objective:  Vitals:   10/04/23 0518 10/04/23 1022  BP: 131/70 120/68  Pulse: 65 63  Resp: 16 18  Temp: 98.1 F (36.7 C) 97.8 F (36.6 C)  SpO2: 96% 98%     Intake/Output Summary (Last 24 hours) at 10/04/2023 1119 Last data filed at 10/04/2023 1023 Gross per 24 hour  Intake 122.28 ml  Output --  Net 122.28 ml     REVIEW OF SYSTEMS:   Constitutional: Denies fevers, chills or abnormal night sweats Eyes: Denies blurriness of vision, double vision or watery eyes Ears, nose, mouth, throat, and face: Denies mucositis or sore  throat Respiratory: Denies cough, dyspnea or wheezes Cardiovascular: Denies palpitation, chest discomfort or lower extremity swelling Gastrointestinal: + Abdominal pain Skin: Denies abnormal skin rashes Lymphatics: Denies new lymphadenopathy or easy bruising Neurological: Denies numbness, tingling or new weaknesses Behavioral/Psych: Mood is stable, no new changes  All other systems were reviewed with the patient and are negative.  PHYSICAL EXAMINATION: ECOG PERFORMANCE STATUS: 2 - Symptomatic, <50% confined to bed  Vitals:   10/04/23 0518 10/04/23 1022  BP: 131/70 120/68  Pulse: 65 63  Resp: 16 18  Temp: 98.1 F (36.7 C) 97.8 F (36.6 C)  SpO2: 96% 98%   Filed Weights   10/03/23 0920  Weight: 185 lb (83.9 kg)    GENERAL: alert, no distress and comfortable SKIN: + Pale skin color, texture, turgor are normal, no rashes or significant lesions EYES: normal, conjunctiva are pink and non-injected, sclera clear OROPHARYNX: no exudate, no erythema and lips, buccal mucosa, and tongue normal  NECK: supple, thyroid normal size, non-tender, without nodularity LYMPH: no palpable lymphadenopathy in the cervical, axillary or inguinal LUNGS: clear to auscultation and percussion with normal breathing effort HEART: regular rate & rhythm and no murmurs and no lower extremity edema ABDOMEN: abdomen soft, non-tender and normal bowel sounds MUSCULOSKELETAL: no cyanosis of digits and no clubbing  PSYCH: alert & oriented x 3 with fluent speech NEURO: no focal motor/sensory deficits   All questions were answered. The patient knows to call the clinic with any problems, questions or concerns.   The total time spent in the appointment was 40 minutes encounter  with patient including review of chart and various tests results, discussions about plan of care and coordination of care plan  [name], NP 10/04/2023 11:19 AM    Labs Reviewed:  Lab Results  Component Value Date   WBC 2.5 (L)  10/04/2023   HGB 8.7 (L) 10/04/2023   HCT 28.0 (L) 10/04/2023   MCV 88.9 10/04/2023   PLT 118 (L) 10/04/2023   Recent Labs    09/14/23 0903 09/29/23 1412 10/03/23 0944 10/04/23 0350  NA 138 139 136 137  K 3.8 3.9 3.3* 3.1*  CL 106 107 107 106  CO2 26 27 22 23   GLUCOSE 127* 119* 113* 93  BUN 17 23 25* 22  CREATININE 1.04 0.95 1.06 1.17  CALCIUM 9.0 8.8* 8.6* 8.3*  GFRNONAA >60 >60 >60 >60  PROT 6.2* 6.0* 6.0*  --   ALBUMIN 3.3* 3.3* 2.9*  --   AST 13* 15 21  --   ALT 9 10 14   --   ALKPHOS 62 51 54  --   BILITOT 0.4 0.3 0.6  --     Studies Reviewed:  NM Hepatobiliary Liver Func Result Date: 10/04/2023 CLINICAL DATA:  Cholelithiasis and cholecystitis. Recent percutaneous cholecystostomy, which no longer in place. Internal biliary stent. Pancreatic carcinoma. EXAM: NUCLEAR MEDICINE HEPATOBILIARY IMAGING TECHNIQUE: Sequential images of the abdomen were obtained out to 60 minutes following intravenous administration of radiopharmaceutical. Because no gallbladder activity was visualized at this time, 3 mg morphine was administered intravenously, and imaging was continued for another 30 minutes RADIOPHARMACEUTICALS:  5.3 mCi Tc-83m  Choletec IV COMPARISON:  None Available. FINDINGS: Prompt uptake and biliary excretion of activity by the liver is seen. Biliary activity promptly passes into small bowel, consistent with patent common bile duct. No gallbladder activity is visualized, either before or following administration of intravenous morphine, consistent with cystic duct obstruction. Reflux of biliary activity into the stomach is also noted. IMPRESSION: Absence of gallbladder activity, consistent with cystic duct obstruction. No evidence of biliary obstruction. Bile reflux noted. Electronically Signed   By: Danae Orleans M.D.   On: 10/04/2023 10:33   US Abdomen Limited RUQ (LIVER/GB) Result Date: 10/03/2023 CLINICAL DATA:  [id number] Thickening of wall of gallbladder [id number]  Cholelithiasis [id number] EXAM: ULTRASOUND ABDOMEN LIMITED RIGHT UPPER QUADRANT COMPARISON:  US Abdomen, 01/11/2023. CT of pelvis, 10/03/2023 and 09/01/2023. FINDINGS: Suboptimal evaluation, with poor acoustic penetration secondary to patient habitus. Gallbladder: Dependent echogenic gallstone, within a nondistended gallbladder, measuring up to 2.3 cm. Gallbladder wall thickening, measuring 0.6 cm with mild pericholecystic fluid. No sonographic Murphy sign noted by sonographer. Common bile duct: Diameter: Poorly visualized. Liver: Increased hepatic parenchymal echogenicity. Ovoid, hypoechoic RIGHT liver lesion measuring 2.2 x 1.4 x 1.5 cm without internal vascularity likely consistent with a cysts. Portal vein is patent on color Doppler imaging with normal direction of blood flow towards the liver. Other: No perihepatic fluid. IMPRESSION: 1. Nondistended gallbladder with gallstone, relative wall thickening and pericholecystic fluid. Examination is equivocal for calculus cholecystitis, consider NM HIDA for further evaluation if continued clinical concern. 2. Echogenic liver. Findings most commonly seen in hepatic steatosis, though may also represent hepatitis and/or fibrosis. Electronically Signed   By: Roanna Banning M.D.   On: 10/03/2023 14:54   CT ABDOMEN PELVIS W CONTRAST Result Date: 10/03/2023 CLINICAL DATA:  Abdominal pain, acute, nonlocalized pancreatic cancer, hx of bilary stent, pain worse in ruq Right lower quadrant abdominal pain for 3 days with nausea. Recently completed radiation therapy. EXAM: CT ABDOMEN  AND PELVIS WITH CONTRAST TECHNIQUE: Multidetector CT imaging of the abdomen and pelvis was performed using the standard protocol following bolus administration of intravenous contrast. RADIATION DOSE REDUCTION: This exam was performed according to the departmental dose-optimization program which includes automated exposure control, adjustment of the mA and/or kV according to patient size and/or use of  iterative reconstruction technique. CONTRAST:  OMNIPAQUE IOHEXOL 300 MG/ML  SOLN COMPARISON:  Abdominopelvic CT 09/01/2023 and 08/14/2023. Abdominal radiographs 09/06/2023. FINDINGS: Lower chest: Chronic elevation of the left hemidiaphragm with associated chronic bibasilar atelectasis. The overall aeration of the left lung base has improved from the prior study. No significant pleural or pericardial effusion. Patient has a pacemaker. There is aortic and coronary artery atherosclerosis. Hepatobiliary: Grossly stable hepatic cysts. No suspicious liver lesions are identified. Metallic biliary stent is unchanged in position with persistent pneumobilia. Small gallstones, gallbladder wall thickening and pericholecystic inflammation, similar to previous CT. Since the prior CT, the patient had a cholecystostomy tube placed and removed. Pancreas: Grossly stable ill-defined 2.2 cm mass involving the pancreatic head on image 29/4. Persistent moderate pancreatic ductal dilatation. No acute peripancreatic fluid collections are identified. Spleen: Normal in size without focal abnormality. Adrenals/Urinary Tract: Both adrenal glands appear normal. No evidence of urinary tract calculus, suspicious renal lesion or hydronephrosis. Unchanged renal cysts for which no specific follow-up imaging is recommended. The bladder appears unremarkable for its degree of distention. Stomach/Bowel: No enteric contrast was administered. A duodenal stent is in place (placed 09/05/2023) and extending from the distal stomach into the 2nd portion of the duodenum, grossly unchanged from reference prior radiographs. There is fluid density within the stent lumen without soft tissue components to suggest tumor ingrowth. The stomach remains decompressed with stable postsurgical changes proximally. No significant bowel distension, wall thickening or surrounding inflammation. The appendix appears normal. There is a patent distal colonic anastomosis.  Vascular/Lymphatic: No enlarged abdominopelvic lymph nodes are identified. Diffuse aortic and branch vessel atherosclerosis without evidence of aneurysm or acute large vessel occlusion. There is chronic venous occlusion at the portal splenic confluence. Reproductive: Prostate brachytherapy seeds. Other: Postsurgical changes in the anterior abdominal wall. Generalized subcutaneous and mesenteric edema has mildly progressed compared with the prior CT. No ascites, focal fluid collection or pneumoperitoneum demonstrated. Musculoskeletal: No acute or significant osseous findings. Multilevel spondylosis. IMPRESSION: 1. Cholelithiasis with gallbladder wall thickening and pericholecystic inflammation, similar to previous CT. Since the prior CT, the patient had a cholecystostomy tube placed and removed. Findings could reflect ongoing cholecystitis. 2. Interval duodenal stent placement with decompression of the stomach. 3. Grossly stable ill-defined pancreatic head mass with associated pancreatic ductal dilatation and chronic venous occlusion. No definite evidence of metastatic disease. 4. Metallic biliary stent remains in place with persistent pneumobilia. 5. Generalized subcutaneous and mesenteric edema has mildly progressed compared with the prior CT. 6.  Aortic Atherosclerosis (ICD10-I70.0). Electronically Signed   By: Carey Bullocks M.D.   On: 10/03/2023 12:23   IR Sinus/Fist Tube Chk-Non GI Result Date: 09/22/2023 INDICATION: Retracted cholecystostomy, no output EXAM: Fluoroscopic evaluation and injection of the existing cholecystostomy. Cholecystostomy removal MEDICATIONS: None. ANESTHESIA/SEDATION: None. COMPLICATIONS: None immediate. PROCEDURE: Informed written consent was obtained from the patient after a thorough discussion of the procedural risks, benefits and alternatives. All questions were addressed. Maximal Sterile Barrier Technique was utilized including caps, mask, sterile gowns, sterile gloves, sterile  drape, hand hygiene and skin antiseptic. A timeout was performed prior to the initiation of the procedure. Existing right upper quadrant cholecystostomy is clearly retracted. Injection  confirms the catheter has retracted and is within the periphery of the right liver. Catheter was cut and removed. Kumpe catheter was advanced into the percutaneous tract. Contrast injection demonstrates no residual patent transhepatic tract to the gallbladder. Catheter and guidewire access removed. Access could not be re-established into the gallbladder. Following this, ultrasound of the right upper quadrant demonstrates no significant dilatation of the gallbladder. Cholelithiasis noted. Diffuse pneumobilia as before related to the known endoscopic biliary stent. IMPRESSION: Retracted cholecystostomy within the periphery of the right liver. No residual patent percutaneous transhepatic tract to reestablish gallbladder access. Therefore the cholecystostomy was removed. Right upper quadrant limited ultrasound demonstrates no significant dilatation of the gallbladder. Gallstones are noted. No sonographic Murphy's sign. Findings discussed with the patient and his son. They understand if he has recurrent right upper quadrant pain, fevers, nausea and vomiting he may require cholecystostomy replacement in the future. They are in agreement with this plan. Electronically Signed   By: Judie Petit.  Shick M.D.   On: 09/22/2023 17:16   DG Abd 2 Views Result Date: 09/06/2023 CLINICAL DATA:  Nausea, vomiting. EXAM: ABDOMEN - 2 VIEW COMPARISON:  June 22, 2022. FINDINGS: No abnormal bowel dilatation is noted. Surgical sutures are noted in left upper quadrant. Percutaneous drainage catheter is seen laterally in right upper quadrant of uncertain etiology. Probable common bile duct and duodenal stents are noted. IMPRESSION: No abnormal bowel dilatation. Electronically Signed   By: Lupita Raider M.D.   On: 09/06/2023 13:14   DG C-Arm 1-60 Min-No  Report Result Date: 09/05/2023 Fluoroscopy was utilized by the requesting physician.  No radiographic interpretation.   Marcus [name] was interviewed and examined.  He was admitted yesterday with right abdominal pain.  He describes right lower abdominal pain beginning on 10/01/2023.  The pain is better today.  He has early satiety.  He denies nausea and vomiting.  His appetite is poor.  I reviewed the admission CT images.  IR has been consulted to consider a diagnosis of cholecystitis and need for replacement of the gallbladder drain.  The nuclear medicine scan today revealed absence of gallbladder activity.  He is being scheduled for gallbladder drain placement.  It is possible the pain is related to gallbladder inflammation versus pancreas cancer.  Marcus [name] decided to discontinue capecitabine and radiation when we saw him on 09/29/2023.  He will be followed with observation.  Recommendations: Gallbladder drain placement per interventional radiology Outpatient follow-up at the Cancer center as scheduled

## 2023-10-04 NOTE — Significant Event (Signed)
 Rapid Response Event Note   Reason for Call :  Shaking, elevated heart rate  Initial Focused Assessment:  On arrival patient in bed and having full body shakes. Just arrived back within hour after having Cholecystostomy tube placed.  Hands cool to touch, SpO2 reading 67% on 4L [place]. Placed on NRB, tachypnea but denies having trouble breathing.  Alert to place, time and situation. Pale in color. Heart rate palpated as monitor was not giving accurate reading d/t rigors. HR palpated 120, pulses 2+. SBP 130s.  Drainage site on right side intact and clean.  Lungs diminished bilaterally.   Discussed on phone with MD, transfer to step down to monitor overnight.    Interventions:  Transfer to SD for additional monitoring  ABG STAT Chest X ray STAT   Event Summary:   MD Notified:  Girguis MD- discussed via phone  Call Time: 1640 Arrival [id number] End Time: 1515  Rosaria Ferries, RN

## 2023-10-04 NOTE — Assessment & Plan Note (Signed)
-   On Eliquis at home - Held on admission due to need for procedure

## 2023-10-04 NOTE — Hospital Course (Addendum)
 83 yo male with PMH pancreatic cancer, A-fib, HTN, CAD, gout, OSA, prostate cancer who presented with recurrent abdominal pain who had recently been diagnosed with acute cholecystitis and underwent cholecystostomy tube placement on 09/03/2023, recent duodenal stent placed during that hospitalization due to gastric outlet obstruction and on 09/22/2023 his cholecystostomy tube was removed by radiology after found to be displaced who presented with recurrent symptoms-abdominal pain nausea vomiting x 3 days no fever. CT a/p, RUQ [place] and HIDA scan with findings of ongoing cholecystitis and recurrent cystic duct obstruction S/p IR placement of percutaneous cholecystostomy tube and patient with progression to hypotension, PCCM consulted 4/2,> transferred to [facility] for pressors.  Patient had been weaned off pressors to midodrine.  Culture drain polymicrobial-being managed with IV antibiotics, IR following for drain. 10/09/23> transferred to [facility]  Subjective: Seen this am No BM last a week ago, diarrhea on 4/4, held miralax and got this am passing has Still having pain on RUQ Overnight BP stable afebrile on room air Labs reviewed AKI resolved creatinine 1.1, CBC with leukopenia 1.8 hemoglobin 8.7>8.3, plt 114>100.   Assessment and plan:  Septic shock due to acute cholecystitis/cystic duct obstruction S/p IR Perc cholecystectomy tube placement 4/1 Possible RLL pneumonia CT abd 4/1-interval cholecystostomy catheter placement with decompression of the gallbladder, no other significant finding.  Some atelectasis, air bronchograms at lower lobe and pancreatic mass. Weaned off pressors-BP stable continue midodrine 10mg  tid and cont to wean as able. Blood culture NGTD. Biliary culture-polymicrobial- cont Cefepime/metronidazole 4/5>>  Abdominal pain: Likely in the setting of pancreatic malignancy versus cholecystitis. Continue pain management  AKI Metabolic acidosis: Resolved. Peaked to 1.9. Continue to encourage  oral hydration. Recent Labs  Lab 10/05/23 0024 10/06/23 0814 10/07/23 0828 10/08/23 0413 10/09/23 0356  BUN 29* 34* 33* 28* 25*  CREATININE 1.45* 1.95* 1.54* 1.32* 1.19    BPH Cont flomax/Ditropan   Hypertension CHFrEF: PTA on amlodipine/valsartan hydrochlorothiazide- antihypertensive on hold. Echo 4/30 shows EF 35 40%  ( previous 45-50% in 12/14/22), G1 DD GHK RV SF normal and RV size normal LA moderately dilated-  Leukopenia likely from sepsis Thrombocytopenia due to sepsis: Anemia of malignancy Continue to monitor cbc, transfuse for hemoglobin less than 7 g.  ?  On long-term prednisone:  Patient reported not taking it we will discuss due to [facility] tomorrow abt this- he prescribed that back in March 2  continue PT OT. ?Long-term prednisone he is not sure-was palced by Dr Truett Perna- will d/w Dr Alcide Evener.  Atrial fibrillation: Currently AV paced.  Eliquis remains on hold since 3/30.   Pancreatic adenocarcinoma Goals of care discussion: Followed by Dr. Myrle Sheng. As of 3/27 patient has decided to hold on further radiation and Xeloda treatments He is DNR DNI  Failure to thrive Severe protein calorie malnutrition: Augment diet, has lost about 35 pounds since January.  Dietitian consulted

## 2023-10-04 NOTE — Procedures (Signed)
 Vascular and Interventional Radiology Procedure Note  Patient: Marcus [name] DOB: 1941-06-16 Medical Record Number: [id number] Note Date/Time: 10/04/23 3:04 PM   Performing Physician: Roanna Banning, MD Assistant(s): None  Diagnosis: Cholecystitis. Prior drain fell out.  Procedure:  CHOLECYSTOSTOMY TUBE PLACEMENT ANTEROGRADE CHOLANGIOGRAM  Anesthesia: Conscious Sedation Complications: None Estimated Blood Loss: Minimal Specimens:  None  Findings:  Successful placement of 47F cholecystostomy tube.  Plan: Flush tube w 5 mL sterile NS q8h and record drain output qShift. Follow up for routine tube evaluation in 6-8 week(s).   See detailed procedure note with images in PACS. The patient tolerated the procedure well without incident or complication and was returned to Recovery in stable condition.    Roanna Banning, MD [facility]   Pager. [phone] Clinic. [phone]

## 2023-10-04 NOTE — Assessment & Plan Note (Signed)
 Continue amlodipine

## 2023-10-04 NOTE — Progress Notes (Signed)
 Referring Physician(s): Girguis,D  Supervising Physician: Roanna Banning  Patient Status:  [facility] - In-pt  Chief Complaint: Pancreatic cancer with CBD stenting, recent abdominal pain/cholecystitis with perc GB drain placement 09/03/23 and subsequent dislodgement; now admitted with recurrent abd pain,? need for GB drain replacement   Subjective: Pt doing ok ; currently denies fever,HA,CP,worsening dyspnea, cough, sig abd/back pain,N/V or bleeding; family in room    Past Medical History:  Diagnosis Date   Acute meniscal tear of knee left   Arthritis    generalized   AV block 08/01/2018   Benign essential hypertension 01/18/2007   Bronchiectasis with acute exacerbation 06/23/2020   Chest pain, atypical 08/23/2014   Chronic cough 10/07/2014   Coronary artery disease    Degeneration of lumbar intervertebral disc 10/14/2017   Diverticulosis of colon 07/07/2005   Elevated PSA 06/19/2014   GERD (gastroesophageal reflux disease) 01/18/2007   Gout, unspecified 01/18/2007   Hemorrhoids 01/19/2011   Hiatal hernia    History of colonic polyps 01/18/2007   Insomnia, unspecified 08/21/2007   Iron deficiency anemia, unspecified  01/19/2011   Lumbar post-laminectomy syndrome 10/14/2017   Lumbar spondylolysis    Lumbar strain 12/14/2011   Obstructive sleep apnea 01/18/2007   uses CPAP nightly   Paraesophageal hernia    large   Primary osteoarthritis of knee 05/23/2015   Prostate cancer (HCC) 2016   S/P knee replacement 05/23/2015   Sensorineural hearing loss, bilateral    Vitamin B12 deficiency    Vitamin D deficiency 10/28/2009   Past Surgical History:  Procedure Laterality Date   BILIARY BRUSHING  12/13/2022   Procedure: BILIARY BRUSHING;  Surgeon: Iva Boop, MD;  Location: [facility] ENDOSCOPY;  Service: Gastroenterology;;   BILIARY STENT PLACEMENT  12/13/2022   Procedure: BILIARY STENT PLACEMENT;  Surgeon: Iva Boop, MD;  Location: [facility] ENDOSCOPY;  Service: Gastroenterology;;    BILIARY STENT PLACEMENT N/A 02/07/2023   Procedure: BILIARY STENT PLACEMENT;  Surgeon: Lemar Lofty., MD;  Location: [facility] ENDOSCOPY;  Service: Gastroenterology;  Laterality: N/A;   BIOPSY  12/23/2022   Procedure: BIOPSY;  Surgeon: Meridee Score Netty Starring., MD;  Location: [facility] ENDOSCOPY;  Service: Gastroenterology;;   BIOPSY  09/05/2023   Procedure: BIOPSY;  Surgeon: Lemar Lofty., MD;  Location: [facility] ENDOSCOPY;  Service: Gastroenterology;;   BRONCHIAL WASHINGS  10/30/2019   Procedure: BRONCHIAL WASHINGS;  Surgeon: [name], DO;  Location: [facility] ENDOSCOPY;  Service: Endoscopy;;   CARDIAC CATHETERIZATION     CATARACT EXTRACTION W/ INTRAOCULAR LENS  IMPLANT, BILATERAL Bilateral    COLONOSCOPY     DUODENAL STENT PLACEMENT N/A 09/05/2023   Procedure: INSERTION, STENT, DUODENUM;  Surgeon: Lemar Lofty., MD;  Location: [facility] ENDOSCOPY;  Service: Gastroenterology;  Laterality: N/A;   ENDOSCOPIC RETROGRADE CHOLANGIOPANCREATOGRAPHY (ERCP) WITH PROPOFOL N/A 02/07/2023   Procedure: ENDOSCOPIC RETROGRADE CHOLANGIOPANCREATOGRAPHY (ERCP) WITH PROPOFOL;  Surgeon: Meridee Score Netty Starring., MD;  Location: [facility] ENDOSCOPY;  Service: Gastroenterology;  Laterality: N/A;  Stent change   ERCP N/A 12/13/2022   Procedure: ENDOSCOPIC RETROGRADE CHOLANGIOPANCREATOGRAPHY (ERCP);  Surgeon: Iva Boop, MD;  Location: [facility] ENDOSCOPY;  Service: Gastroenterology;  Laterality: N/A;  with stent placement   ESOPHAGOGASTRODUODENOSCOPY     ESOPHAGOGASTRODUODENOSCOPY (EGD) WITH PROPOFOL N/A 12/23/2022   Procedure: ESOPHAGOGASTRODUODENOSCOPY (EGD) WITH PROPOFOL;  Surgeon: Meridee Score Netty Starring., MD;  Location: [facility] ENDOSCOPY;  Service: Gastroenterology;  Laterality: N/A;   ESOPHAGOGASTRODUODENOSCOPY (EGD) WITH PROPOFOL N/A 09/05/2023   Procedure: ESOPHAGOGASTRODUODENOSCOPY (EGD) WITH PROPOFOL;  Surgeon: Meridee Score Netty Starring., MD;  Location: [facility] ENDOSCOPY;  Service: Gastroenterology;  Laterality: N/A;   EUS N/A 12/23/2022    Procedure: UPPER ENDOSCOPIC ULTRASOUND (EUS) RADIAL;  Surgeon: Lemar Lofty., MD;  Location: [facility] ENDOSCOPY;  Service: Gastroenterology;  Laterality: N/A;   FINE NEEDLE ASPIRATION  12/23/2022   Procedure: FINE NEEDLE ASPIRATION (FNA) RADIAL;  Surgeon: Lemar Lofty., MD;  Location: [facility] ENDOSCOPY;  Service: Gastroenterology;;   IR IMAGING GUIDED PORT INSERTION  01/04/2023   IR PERC CHOLECYSTOSTOMY  09/03/2023   IR SINUS/FIST TUBE CHK-NON GI  09/22/2023   KNEE ARTHROSCOPY Right 2007   KNEE ARTHROSCOPY  07/13/2011   Procedure: ARTHROSCOPY KNEE;  Surgeon: Erasmo Leventhal;  Location: [facility];  Service: Orthopedics;  Laterality: Left;  partial menisectomy with chondrylplasty   KNEE SURGERY Left    LAMINECTOMY AND MICRODISCECTOMY LUMBAR SPINE  MARCH  2008   L3 -  4   LAPAROSCOPIC INCISIONAL / UMBILICAL / VENTRAL HERNIA REPAIR  2006   PACEMAKER IMPLANT N/A 08/02/2018   Procedure: PACEMAKER IMPLANT;  Surgeon: Marinus Maw, MD;  Location: [facility] INVASIVE CV LAB;  Service: Cardiovascular;  Laterality: N/A;   PROSTATE BIOPSY     RADIOACTIVE SEED IMPLANT N/A 02/12/2019   Procedure: RADIOACTIVE SEED IMPLANT/BRACHYTHERAPY IMPLANT;  Surgeon: Marcine Matar, MD;  Location: [facility] ORS;  Service: Urology;  Laterality: N/A;  90 MINS   REMOVAL OF STONES  02/07/2023   Procedure: REMOVAL OF Sludge;  Surgeon: Meridee Score Netty Starring., MD;  Location: [facility] ENDOSCOPY;  Service: Gastroenterology;;   SHOULDER ARTHROSCOPY Right 05-23-2007   SIGMOID COLECTOMY FOR CANCER  1989   SPACE OAR INSTILLATION N/A 02/12/2019   Procedure: SPACE OAR INSTILLATION;  Surgeon: Marcine Matar, MD;  Location: [facility] ORS;  Service: Urology;  Laterality: N/A;   SPHINCTEROTOMY  12/13/2022   Procedure: SPHINCTEROTOMY;  Surgeon: Iva Boop, MD;  Location: [facility] ENDOSCOPY;  Service: Gastroenterology;;   Francine Graven REMOVAL  02/07/2023   Procedure: STENT REMOVAL;  Surgeon: Lemar Lofty., MD;  Location: [facility] ENDOSCOPY;   Service: Gastroenterology;;   TOTAL KNEE ARTHROPLASTY Left 05/23/2015   Procedure: LEFT TOTAL KNEE ARTHROPLASTY;  Surgeon: Eugenia Mcalpine, MD;  Location: [facility] ORS;  Service: Orthopedics;  Laterality: Left;   VERTICAL BANDED GASTROPLASTY  1986   VIDEO BRONCHOSCOPY N/A 10/30/2019   Procedure: VIDEO BRONCHOSCOPY WITH BRONICAL ALVEROLAR LAVAGE WITHOUT FLUORO;  Surgeon: [name], DO;  Location: [facility] ENDOSCOPY;  Service: Endoscopy;  Laterality: N/A;      Allergies: Patient has no known allergies.  Medications: Prior to Admission medications   Medication Sig Start Date End Date Taking? Authorizing Provider  acetaminophen (TYLENOL) 325 MG tablet Take 2 tablets (650 mg total) by mouth every 6 (six) hours as needed for mild pain (or Fever >/= 101). Patient taking differently: Take 650 mg by mouth every 6 (six) hours as needed for mild pain (pain score 1-3) or moderate pain (pain score 4-6) (or Fever >/= 101). 01/13/23  Yes Sheikh, Omair Latif, DO  apixaban (ELIQUIS) 5 MG TABS tablet Take 1 tablet (5 mg total) by mouth 2 (two) times daily. 02/23/23 10/04/23 Yes Nafziger, Kandee Keen, NP  cyanocobalamin (VITAMIN B12) 500 MCG tablet Take 1 tablet by mouth 2 (two) times daily. 07/26/23  Yes [provider]  eszopiclone 3 MG TABS Take 1 tablet (3 mg total) by mouth at bedtime. Take immediately before bedtime. Patient taking differently: Take 3 mg by mouth at bedtime as needed (sleep). 09/09/23  Yes Nafziger, Kandee Keen, NP  oxybutynin (DITROPAN) 5 MG tablet Take 5 mg by  mouth 2 (two) times daily as needed for bladder spasms (urinary incontinence).   Yes [provider]  oxyCODONE (ROXICODONE) 5 MG immediate release tablet Take 1 tablet (5 mg total) by mouth every 6 (six) hours as needed for severe pain (pain score 7-10). 09/09/23  Yes Nafziger, Kandee Keen, NP  tamsulosin (FLOMAX) 0.4 MG CAPS capsule Take 1 capsule by mouth daily. 06/21/23  Yes [provider]  valsartan-hydrochlorothiazide (DIOVAN-HCT)  160-25 MG tablet Take 1 tablet by mouth daily.   Yes [provider]  allopurinol (ZYLOPRIM) 300 MG tablet Take 300 mg by mouth daily. Patient not taking: Reported on 10/04/2023    [provider]  amLODipine (NORVASC) 10 MG tablet Take 10 mg by mouth daily. Patient not taking: Reported on 10/04/2023    [provider]  capecitabine (XELODA) 500 MG tablet Take 3 tablets (1,500 mg total) by mouth 2 (two) times daily after a meal. Take Monday-Friday. Take only days of radiation. Patient not taking: Reported on 10/04/2023 08/25/23   [name], MD  LORazepam (ATIVAN) 0.5 MG tablet Take 1 tablet (0.5 mg total) by mouth every 8 (eight) hours as needed for anxiety. Patient not taking: Reported on 10/04/2023 09/29/23   Rana Snare, NP  predniSONE (DELTASONE) 10 MG tablet Take 1 tablet (10 mg total) by mouth daily with breakfast. Patient not taking: Reported on 10/04/2023 09/14/23   Rana Snare, NP     Vital Signs: BP 129/66 (BP Location: Right Arm)   Pulse 60   Temp 98.7 F (37.1 C) (Oral)   Resp 18   Ht 5\' 8"  (1.727 m)   Wt 185 lb (83.9 kg)   SpO2 98%   BMI 28.13 kg/m   Physical Exam: awake/alert; chest- dim BS left base, right clear; left chest wall pacer; rt chest port a cath; heart- RRR; abd-soft,+BS,NT; no LE edema  Imaging: NM Hepatobiliary Liver Func Result Date: 10/04/2023 CLINICAL DATA:  Cholelithiasis and cholecystitis. Recent percutaneous cholecystostomy, which no longer in place. Internal biliary stent. Pancreatic carcinoma. EXAM: NUCLEAR MEDICINE HEPATOBILIARY IMAGING TECHNIQUE: Sequential images of the abdomen were obtained out to 60 minutes following intravenous administration of radiopharmaceutical. Because no gallbladder activity was visualized at this time, 3 mg morphine was administered intravenously, and imaging was continued for another 30 minutes RADIOPHARMACEUTICALS:  5.3 mCi Tc-5m  Choletec IV COMPARISON:  None Available. FINDINGS: Prompt  uptake and biliary excretion of activity by the liver is seen. Biliary activity promptly passes into small bowel, consistent with patent common bile duct. No gallbladder activity is visualized, either before or following administration of intravenous morphine, consistent with cystic duct obstruction. Reflux of biliary activity into the stomach is also noted. IMPRESSION: Absence of gallbladder activity, consistent with cystic duct obstruction. No evidence of biliary obstruction. Bile reflux noted. Electronically Signed   By: Danae Orleans M.D.   On: 10/04/2023 10:33   US Abdomen Limited RUQ (LIVER/GB) Result Date: 10/03/2023 CLINICAL DATA:  [id number] Thickening of wall of gallbladder [id number] Cholelithiasis [id number] EXAM: ULTRASOUND ABDOMEN LIMITED RIGHT UPPER QUADRANT COMPARISON:  US Abdomen, 01/11/2023. CT of pelvis, 10/03/2023 and 09/01/2023. FINDINGS: Suboptimal evaluation, with poor acoustic penetration secondary to patient habitus. Gallbladder: Dependent echogenic gallstone, within a nondistended gallbladder, measuring up to 2.3 cm. Gallbladder wall thickening, measuring 0.6 cm with mild pericholecystic fluid. No sonographic Murphy sign noted by sonographer. Common bile duct: Diameter: Poorly visualized. Liver: Increased hepatic parenchymal echogenicity. Ovoid, hypoechoic RIGHT liver lesion measuring 2.2 x 1.4 x 1.5 cm without  internal vascularity likely consistent with a cysts. Portal vein is patent on color Doppler imaging with normal direction of blood flow towards the liver. Other: No perihepatic fluid. IMPRESSION: 1. Nondistended gallbladder with gallstone, relative wall thickening and pericholecystic fluid. Examination is equivocal for calculus cholecystitis, consider NM HIDA for further evaluation if continued clinical concern. 2. Echogenic liver. Findings most commonly seen in hepatic steatosis, though may also represent hepatitis and/or fibrosis. Electronically Signed   By: Roanna Banning M.D.   On:  10/03/2023 14:54   CT ABDOMEN PELVIS W CONTRAST Result Date: 10/03/2023 CLINICAL DATA:  Abdominal pain, acute, nonlocalized pancreatic cancer, hx of bilary stent, pain worse in ruq Right lower quadrant abdominal pain for 3 days with nausea. Recently completed radiation therapy. EXAM: CT ABDOMEN AND PELVIS WITH CONTRAST TECHNIQUE: Multidetector CT imaging of the abdomen and pelvis was performed using the standard protocol following bolus administration of intravenous contrast. RADIATION DOSE REDUCTION: This exam was performed according to the departmental dose-optimization program which includes automated exposure control, adjustment of the mA and/or kV according to patient size and/or use of iterative reconstruction technique. CONTRAST:  OMNIPAQUE IOHEXOL 300 MG/ML  SOLN COMPARISON:  Abdominopelvic CT 09/01/2023 and 08/14/2023. Abdominal radiographs 09/06/2023. FINDINGS: Lower chest: Chronic elevation of the left hemidiaphragm with associated chronic bibasilar atelectasis. The overall aeration of the left lung base has improved from the prior study. No significant pleural or pericardial effusion. Patient has a pacemaker. There is aortic and coronary artery atherosclerosis. Hepatobiliary: Grossly stable hepatic cysts. No suspicious liver lesions are identified. Metallic biliary stent is unchanged in position with persistent pneumobilia. Small gallstones, gallbladder wall thickening and pericholecystic inflammation, similar to previous CT. Since the prior CT, the patient had a cholecystostomy tube placed and removed. Pancreas: Grossly stable ill-defined 2.2 cm mass involving the pancreatic head on image 29/4. Persistent moderate pancreatic ductal dilatation. No acute peripancreatic fluid collections are identified. Spleen: Normal in size without focal abnormality. Adrenals/Urinary Tract: Both adrenal glands appear normal. No evidence of urinary tract calculus, suspicious renal lesion or hydronephrosis.  Unchanged renal cysts for which no specific follow-up imaging is recommended. The bladder appears unremarkable for its degree of distention. Stomach/Bowel: No enteric contrast was administered. A duodenal stent is in place (placed 09/05/2023) and extending from the distal stomach into the 2nd portion of the duodenum, grossly unchanged from reference prior radiographs. There is fluid density within the stent lumen without soft tissue components to suggest tumor ingrowth. The stomach remains decompressed with stable postsurgical changes proximally. No significant bowel distension, wall thickening or surrounding inflammation. The appendix appears normal. There is a patent distal colonic anastomosis. Vascular/Lymphatic: No enlarged abdominopelvic lymph nodes are identified. Diffuse aortic and branch vessel atherosclerosis without evidence of aneurysm or acute large vessel occlusion. There is chronic venous occlusion at the portal splenic confluence. Reproductive: Prostate brachytherapy seeds. Other: Postsurgical changes in the anterior abdominal wall. Generalized subcutaneous and mesenteric edema has mildly progressed compared with the prior CT. No ascites, focal fluid collection or pneumoperitoneum demonstrated. Musculoskeletal: No acute or significant osseous findings. Multilevel spondylosis. IMPRESSION: 1. Cholelithiasis with gallbladder wall thickening and pericholecystic inflammation, similar to previous CT. Since the prior CT, the patient had a cholecystostomy tube placed and removed. Findings could reflect ongoing cholecystitis. 2. Interval duodenal stent placement with decompression of the stomach. 3. Grossly stable ill-defined pancreatic head mass with associated pancreatic ductal dilatation and chronic venous occlusion. No definite evidence of metastatic disease. 4. Metallic biliary stent remains in place with persistent pneumobilia.  5. Generalized subcutaneous and mesenteric edema has mildly progressed  compared with the prior CT. 6.  Aortic Atherosclerosis (ICD10-I70.0). Electronically Signed   By: Carey Bullocks M.D.   On: 10/03/2023 12:23    Labs:  CBC: Recent Labs    09/14/23 0903 09/29/23 1412 10/03/23 0944 10/04/23 0350  WBC 3.8* 2.8* 2.9* 2.5*  HGB 9.1* 9.3* 9.1* 8.7*  HCT 28.2* 29.7* 28.5* 28.0*  PLT 203 167 139* 118*    COAGS: Recent Labs    12/14/22 0037 01/09/23 1635 09/01/23 2120 09/02/23 1747 09/03/23 0404 09/04/23 0101 09/04/23 1041 09/05/23 0500 09/06/23 0340 10/04/23 0350  INR 1.4* 1.3*  --  1.4*  --   --   --   --   --  1.4*  APTT  --   --    < >  --    < > 90* 94* 99* 63*  --    < > = values in this interval not displayed.    BMP: Recent Labs    09/14/23 0903 09/29/23 1412 10/03/23 0944 10/04/23 0350  NA 138 139 136 137  K 3.8 3.9 3.3* 3.1*  CL 106 107 107 106  CO2 26 27 22 23   GLUCOSE 127* 119* 113* 93  BUN 17 23 25* 22  CALCIUM 9.0 8.8* 8.6* 8.3*  CREATININE 1.04 0.95 1.06 1.17  GFRNONAA >60 >60 >60 >60    LIVER FUNCTION TESTS: Recent Labs    09/07/23 0226 09/14/23 0903 09/29/23 1412 10/03/23 0944  BILITOT 0.6 0.4 0.3 0.6  AST 11* 13* 15 21  ALT 11 9 10 14   ALKPHOS 55 62 51 54  PROT 5.2* 6.2* 6.0* 6.0*  ALBUMIN 2.2* 3.3* 3.3* 2.9*    Assessment and Plan: 83 yo male with hx pancreatic cancer with CBD stenting, recent abdominal pain/cholecystitis with perc GB drain placement 09/03/23 and subsequent dislodgement; now admitted with recurrent abd pain,? need for GB drain replacement; afebrile; LD eliquis 3/30 am (for afib); WBC 2.5, hgb 8.7(9.1), plts 118k, creat 1.17, PT 17/INR 1.4  [place] ABD YESTERDAY:  1. Nondistended gallbladder with gallstone, relative wall thickening and pericholecystic fluid. Examination is equivocal for calculus cholecystitis, consider NM HIDA for further evaluation if continued clinical concern. 2. Echogenic liver. Findings most commonly seen in hepatic steatosis, though may also represent hepatitis  and/or fibrosis  HIDA scan today:   Absence of gallbladder activity, consistent with cystic duct obstruction.   No evidence of biliary obstruction.   Bile reflux noted.  Images reviewed by Dr. Milford Cage; plan is for image guided perc GB drain replacement today. Risks and benefits discussed with the patient /family including bleeding, infection, damage to adjacent structures,  and sepsis along with need for long term drainage.   All of the patient's questions were answered, patient is agreeable to proceed. Consent signed and in chart.  Pt on IV Zosyn  Electronically Signed: D. Jeananne Rama, PA-C 10/04/2023, 12:45 PM   I spent a total of 25 Minutes at the the patient's bedside AND on the patient's hospital floor or unit, greater than 50% of which was counseling/coordinating care for image guided gallbladder drain placement    Patient ID: Marcus [name], male   DOB: 1940/11/24, 83 y.o.   MRN: [id number]

## 2023-10-04 NOTE — Assessment & Plan Note (Signed)
-   Follows with oncology.  Diagnosed May 2024 -outpatient followup for ongoing treatment per last note

## 2023-10-04 NOTE — Plan of Care (Signed)
  Problem: Education: Goal: Knowledge of General Education information will improve Description: Including pain rating scale, medication(s)/side effects and non-pharmacologic comfort measures Outcome: Progressing   Problem: Health Behavior/Discharge Planning: Goal: Ability to manage health-related needs will improve Outcome: Not Progressing   Problem: Clinical Measurements: Goal: Will remain free from infection Outcome: Not Progressing Goal: Respiratory complications will improve Outcome: Not Progressing Goal: Cardiovascular complication will be avoided Outcome: Not Progressing   Problem: Activity: Goal: Risk for activity intolerance will decrease Outcome: Not Progressing   Problem: Coping: Goal: Level of anxiety will decrease Outcome: Not Progressing

## 2023-10-04 NOTE — Progress Notes (Signed)
 Notified LAB of incoming ABG. 100% NRB

## 2023-10-04 NOTE — Progress Notes (Signed)
 Progress Note    Marcus [name]   AVW:[id number]  DOB: Mar 27, 1941  DOA: 10/03/2023     1 PCP: [name], NP  Initial CC: Abdominal pain  Hospital Course: Marcus [name] is an 83 yo male with PMH pancreatic cancer, A-fib, HTN, CAD, gout, OSA, prostate cancer who presented with recurrent abdominal pain. He had recently been diagnosed with acute cholecystitis and underwent cholecystostomy tube placement on 09/03/2023. He also had recent duodenal stent placed during that hospitalization due to gastric outlet obstruction.  On 09/22/2023 his cholecystostomy tube was removed by radiology after found to be displaced. He has since developed recurrent symptoms at home and presented back for repeat evaluation. He underwent repeat workup with CT abdomen/pelvis, RUQ ultrasound and finally HIDA scan.  Findings ultimately consistent with recurrent cystic duct obstruction.  IR plans to replace cholecystostomy drain.  Interval History:  Pain had improved when seen this morning.  Son present bedside as well.  Reviewed findings of HIDA scan with him. Radiology planning on replacing cholecystostomy tube.  Assessment and Plan: * Acute cholecystitis - HIDA scan abnormal.  Reviewed by IR with plans to replace cholecystostomy tube -Afebrile and neutropenic on admission - Has been started on Zosyn but suspect does not need long course  Pancreatic adenocarcinoma (HCC) - Follows with oncology.  Diagnosed May 2024 -outpatient followup for ongoing treatment per last note   Atrial fibrillation, chronic (HCC) - On Eliquis at home - Held on admission due to need for procedure  Benign essential hypertension - Continue amlodipine   Old records reviewed in assessment of this patient  Antimicrobials: Zosyn 10/03/2023 >> current  DVT prophylaxis:  enoxaparin (LOVENOX) injection 40 mg Start: 10/03/23 2200   Code Status:   Code Status: Limited: Do not attempt resuscitation (DNR) -DNR-LIMITED -Do Not  Intubate/DNI   Mobility Assessment (Last 72 Hours)     Mobility Assessment     Row Name 10/04/23 0745 10/03/23 2050 10/03/23 1438       Does patient have an order for bedrest or is patient medically unstable No - Continue assessment No - Continue assessment No - Continue assessment     What is the highest level of mobility based on the progressive mobility assessment? Level 5 (Walks with assist in room/hall) - Balance while stepping forward/back and can walk in room with assist - Complete Level 5 (Walks with assist in room/hall) - Balance while stepping forward/back and can walk in room with assist - Complete Level 5 (Walks with assist in room/hall) - Balance while stepping forward/back and can walk in room with assist - Complete              Barriers to discharge: none Disposition Plan:  Home HH orders placed:  Status is: Inpt  Objective: Blood pressure 129/66, pulse 60, temperature 98.7 F (37.1 C), temperature source Oral, resp. rate 18, height 5\' 8"  (1.727 m), weight 83.9 kg, SpO2 98%.  Examination:  Physical Exam Constitutional:      General: He is not in acute distress.    Appearance: Normal appearance.  HENT:     Head: Normocephalic and atraumatic.     Mouth/Throat:     Mouth: Mucous membranes are moist.  Eyes:     Extraocular Movements: Extraocular movements intact.  Cardiovascular:     Rate and Rhythm: Normal rate and regular rhythm.  Pulmonary:     Effort: Pulmonary effort is normal. No respiratory distress.     Breath sounds: Normal breath sounds. No wheezing.  Abdominal:     General: Bowel sounds are normal. There is no distension.     Palpations: Abdomen is soft.     Tenderness: There is no abdominal tenderness.  Musculoskeletal:        General: Normal range of motion.     Cervical back: Normal range of motion and neck supple.  Skin:    General: Skin is warm and dry.  Neurological:     General: No focal deficit present.     Mental Status: He is alert.   Psychiatric:        Mood and Affect: Mood normal.        Behavior: Behavior normal.      Consultants:  IR  Procedures:    Data Reviewed: Results for orders placed or performed during the hospital encounter of 10/03/23 (from the past 24 hours)  Lactic acid, plasma     Status: None   Collection Time: 10/03/23  2:48 PM  Result Value Ref Range   Lactic Acid, Venous 0.7 0.5 - 1.9 mmol/L  Basic metabolic panel     Status: Abnormal   Collection Time: 10/04/23  3:50 AM  Result Value Ref Range   Sodium 137 135 - 145 mmol/L   Potassium 3.1 (L) 3.5 - 5.1 mmol/L   Chloride 106 98 - 111 mmol/L   CO2 23 22 - 32 mmol/L   Glucose, Bld 93 70 - 99 mg/dL   BUN 22 8 - 23 mg/dL   Creatinine, Ser 1.91 0.61 - 1.24 mg/dL   Calcium 8.3 (L) 8.9 - 10.3 mg/dL   GFR, Estimated >47 >82 mL/min   Anion gap 8 5 - 15  CBC     Status: Abnormal   Collection Time: 10/04/23  3:50 AM  Result Value Ref Range   WBC 2.5 (L) 4.0 - 10.5 K/uL   RBC 3.15 (L) 4.22 - 5.81 MIL/uL   Hemoglobin 8.7 (L) 13.0 - 17.0 g/dL   HCT 95.6 (L) 21.3 - 08.6 %   MCV 88.9 80.0 - 100.0 fL   MCH 27.6 26.0 - 34.0 pg   MCHC 31.1 30.0 - 36.0 g/dL   RDW 57.8 (H) 46.9 - 62.9 %   Platelets 118 (L) 150 - 400 K/uL   nRBC 0.0 0.0 - 0.2 %  Protime-INR     Status: Abnormal   Collection Time: 10/04/23  3:50 AM  Result Value Ref Range   Prothrombin Time 17.0 (H) 11.4 - 15.2 seconds   INR 1.4 (H) 0.8 - 1.2   *Note: Due to a large number of results and/or encounters for the requested time period, some results have not been displayed. A complete set of results can be found in Results Review.    I have reviewed pertinent nursing notes, vitals, labs, and images as necessary. I have ordered labwork to follow up on as indicated.  I have reviewed the last notes from staff over past 24 hours. I have discussed patient's care plan and test results with nursing staff, CM/SW, and other staff as appropriate.  Time spent: Greater than 50% of the 55  minute visit was spent in counseling/coordination of care for the patient as laid out in the A&P.   LOS: 1 day   Lewie Chamber, MD [facility] 10/04/2023, 12:36 PM

## 2023-10-04 NOTE — Assessment & Plan Note (Signed)
-   HIDA scan abnormal.  Reviewed by IR with plans to replace cholecystostomy tube -Afebrile and neutropenic on admission - Has been started on Zosyn but suspect does not need long course

## 2023-10-05 ENCOUNTER — Inpatient Hospital Stay (HOSPITAL_COMMUNITY)

## 2023-10-05 ENCOUNTER — Ambulatory Visit: Payer: Non-veteran care

## 2023-10-05 ENCOUNTER — Ambulatory Visit

## 2023-10-05 DIAGNOSIS — N179 Acute kidney failure, unspecified: Secondary | ICD-10-CM | POA: Diagnosis not present

## 2023-10-05 DIAGNOSIS — K810 Acute cholecystitis: Secondary | ICD-10-CM

## 2023-10-05 DIAGNOSIS — I4891 Unspecified atrial fibrillation: Secondary | ICD-10-CM

## 2023-10-05 DIAGNOSIS — A419 Sepsis, unspecified organism: Secondary | ICD-10-CM | POA: Diagnosis not present

## 2023-10-05 DIAGNOSIS — R627 Adult failure to thrive: Secondary | ICD-10-CM

## 2023-10-05 DIAGNOSIS — R6521 Severe sepsis with septic shock: Secondary | ICD-10-CM

## 2023-10-05 LAB — CBC WITH DIFFERENTIAL/PLATELET
Abs Immature Granulocytes: 0.03 10*3/uL (ref 0.00–0.07)
Basophils Absolute: 0 10*3/uL (ref 0.0–0.1)
Basophils Relative: 0 %
Eosinophils Absolute: 0 10*3/uL (ref 0.0–0.5)
Eosinophils Relative: 0 %
HCT: 28.7 % — ABNORMAL LOW (ref 39.0–52.0)
Hemoglobin: 9 g/dL — ABNORMAL LOW (ref 13.0–17.0)
Immature Granulocytes: 0 %
Lymphocytes Relative: 2 %
Lymphs Abs: 0.1 10*3/uL — ABNORMAL LOW (ref 0.7–4.0)
MCH: 27.3 pg (ref 26.0–34.0)
MCHC: 31.4 g/dL (ref 30.0–36.0)
MCV: 87 fL (ref 80.0–100.0)
Monocytes Absolute: 0.4 10*3/uL (ref 0.1–1.0)
Monocytes Relative: 6 %
Neutro Abs: 6.1 10*3/uL (ref 1.7–7.7)
Neutrophils Relative %: 92 %
Platelets: 100 10*3/uL — ABNORMAL LOW (ref 150–400)
RBC: 3.3 MIL/uL — ABNORMAL LOW (ref 4.22–5.81)
RDW: 18.4 % — ABNORMAL HIGH (ref 11.5–15.5)
WBC: 6.7 10*3/uL (ref 4.0–10.5)
nRBC: 0 % (ref 0.0–0.2)

## 2023-10-05 LAB — BASIC METABOLIC PANEL WITH GFR
Anion gap: 9 (ref 5–15)
BUN: 29 mg/dL — ABNORMAL HIGH (ref 8–23)
CO2: 21 mmol/L — ABNORMAL LOW (ref 22–32)
Calcium: 8 mg/dL — ABNORMAL LOW (ref 8.9–10.3)
Chloride: 106 mmol/L (ref 98–111)
Creatinine, Ser: 1.45 mg/dL — ABNORMAL HIGH (ref 0.61–1.24)
GFR, Estimated: 48 mL/min — ABNORMAL LOW (ref >60)
Glucose, Bld: 144 mg/dL — ABNORMAL HIGH (ref 70–99)
Potassium: 2.8 mmol/L — ABNORMAL LOW (ref 3.5–5.1)
Sodium: 136 mmol/L (ref 135–145)

## 2023-10-05 LAB — MAGNESIUM: Magnesium: 1.8 mg/dL (ref 1.7–2.4)

## 2023-10-05 LAB — LACTIC ACID, PLASMA
Lactic Acid, Venous: 2.7 mmol/L (ref 0.5–1.9)
Lactic Acid, Venous: 2.3 mmol/L (ref 0.5–1.9)
Lactic Acid, Venous: 2.3 mmol/L (ref 0.5–1.9)

## 2023-10-05 LAB — HEMOGLOBIN AND HEMATOCRIT, BLOOD
HCT: 29 % — ABNORMAL LOW (ref 39.0–52.0)
Hemoglobin: 8.9 g/dL — ABNORMAL LOW (ref 13.0–17.0)

## 2023-10-05 LAB — GLUCOSE, CAPILLARY: Glucose-Capillary: 225 mg/dL — ABNORMAL HIGH (ref 70–99)

## 2023-10-05 MED ORDER — POTASSIUM CHLORIDE 10 MEQ/100ML IV SOLN
10.0000 meq | INTRAVENOUS | Status: AC
  Administered 2023-10-05 (×2): 10 meq via INTRAVENOUS
  Filled 2023-10-05 (×2): qty 100

## 2023-10-05 MED ORDER — NOREPINEPHRINE 4 MG/250ML-% IV SOLN
0.0000 ug/min | INTRAVENOUS | Status: DC
  Administered 2023-10-05: 2 ug/min via INTRAVENOUS
  Administered 2023-10-05 – 2023-10-06 (×2): 9 ug/min via INTRAVENOUS
  Administered 2023-10-06: 4 ug/min via INTRAVENOUS
  Administered 2023-10-07: 2 ug/min via INTRAVENOUS
  Filled 2023-10-05 (×5): qty 250

## 2023-10-05 MED ORDER — LACTATED RINGERS IV BOLUS
1000.0000 mL | Freq: Once | INTRAVENOUS | Status: AC
  Administered 2023-10-05: 1000 mL via INTRAVENOUS

## 2023-10-05 MED ORDER — POTASSIUM CHLORIDE 20 MEQ PO PACK
60.0000 meq | PACK | Freq: Once | ORAL | Status: AC
  Administered 2023-10-05: 60 meq via ORAL
  Filled 2023-10-05 (×2): qty 3

## 2023-10-05 MED ORDER — POTASSIUM CHLORIDE 20 MEQ PO PACK
40.0000 meq | PACK | Freq: Once | ORAL | Status: DC
  Filled 2023-10-05: qty 2

## 2023-10-05 MED ORDER — OXYCODONE HCL 5 MG PO TABS
5.0000 mg | ORAL_TABLET | ORAL | Status: DC | PRN
  Administered 2023-10-05 – 2023-10-10 (×12): 5 mg via ORAL
  Filled 2023-10-05 (×13): qty 1

## 2023-10-05 MED ORDER — POTASSIUM CHLORIDE CRYS ER 20 MEQ PO TBCR: 40.0000 meq | EXTENDED_RELEASE_TABLET | ORAL | Status: DC

## 2023-10-05 MED ORDER — MORPHINE SULFATE (PF) 2 MG/ML IV SOLN
2.0000 mg | INTRAVENOUS | Status: DC | PRN
  Administered 2023-10-08 – 2023-10-11 (×5): 2 mg via INTRAVENOUS
  Filled 2023-10-05 (×5): qty 1

## 2023-10-05 MED ORDER — LACTATED RINGERS IV SOLN: INTRAVENOUS | Status: DC

## 2023-10-05 MED ORDER — POTASSIUM CHLORIDE CRYS ER 20 MEQ PO TBCR
40.0000 meq | EXTENDED_RELEASE_TABLET | Freq: Once | ORAL | Status: DC
Start: 2023-10-05 — End: 2023-10-05
  Filled 2023-10-05: qty 2

## 2023-10-05 MED ORDER — SODIUM CHLORIDE 0.9 % IV BOLUS
500.0000 mL | Freq: Once | INTRAVENOUS | Status: AC
  Administered 2023-10-05: 500 mL via INTRAVENOUS

## 2023-10-05 MED ORDER — IOHEXOL 9 MG/ML PO SOLN
ORAL | Status: AC
  Filled 2023-10-05: qty 1000

## 2023-10-05 MED ORDER — IOHEXOL 300 MG/ML  SOLN
100.0000 mL | Freq: Once | INTRAMUSCULAR | Status: AC | PRN
  Administered 2023-10-05: 75 mL via INTRAVENOUS

## 2023-10-05 MED ORDER — SODIUM CHLORIDE (PF) 0.9 % IJ SOLN
INTRAMUSCULAR | Status: AC
  Filled 2023-10-05: qty 50

## 2023-10-05 MED ORDER — IOHEXOL 9 MG/ML PO SOLN
1000.0000 mL | ORAL | Status: AC
  Administered 2023-10-05: 1000 mL via ORAL

## 2023-10-05 NOTE — Progress Notes (Signed)
 Patient ID: Marcus [name], male   DOB: 16-May-1941, 83 y.o.   MRN: [id number]    Referring Physician(s): Girguis,D   Supervising Physician: Oley Balm  Patient Status:  [facility] - In-pt  Chief Complaint:  Hx pancreatic cancer with CBD stenting. Hx chronic cholecystitis, now admitted for recurrent abdominal pain and need fore repeat cholecystostomy.  Subjective:  Patient is feeling ok, states his abdomen feels "less full". Overnight had acute drop in BP, suspected sepsis per hospitalist and critical care note. On abx, fluids, and pressors now. No complaints related to new gallbladder drain. Has been maintaining consistent output.   Had repeat CT abdomen today that showed decompression of the gallbladder, no hematoma or other apparent complication.  Allergies: Patient has no known allergies.  Medications: Prior to Admission medications   Medication Sig Start Date End Date Taking? Authorizing Provider  acetaminophen (TYLENOL) 325 MG tablet Take 2 tablets (650 mg total) by mouth every 6 (six) hours as needed for mild pain (or Fever >/= 101). Patient taking differently: Take 650 mg by mouth every 6 (six) hours as needed for mild pain (pain score 1-3) or moderate pain (pain score 4-6) (or Fever >/= 101). 01/13/23  Yes Sheikh, Omair Latif, DO  apixaban (ELIQUIS) 5 MG TABS tablet Take 1 tablet (5 mg total) by mouth 2 (two) times daily. 02/23/23 10/04/23 Yes Nafziger, Kandee Keen, NP  cyanocobalamin (VITAMIN B12) 500 MCG tablet Take 1 tablet by mouth 2 (two) times daily. 07/26/23  Yes [provider]  eszopiclone 3 MG TABS Take 1 tablet (3 mg total) by mouth at bedtime. Take immediately before bedtime. Patient taking differently: Take 3 mg by mouth at bedtime as needed (sleep). 09/09/23  Yes Nafziger, Kandee Keen, NP  oxybutynin (DITROPAN) 5 MG tablet Take 5 mg by mouth 2 (two) times daily as needed for bladder spasms (urinary incontinence).   Yes [provider]  oxyCODONE (ROXICODONE) 5 MG  immediate release tablet Take 1 tablet (5 mg total) by mouth every 6 (six) hours as needed for severe pain (pain score 7-10). 09/09/23  Yes Nafziger, Kandee Keen, NP  tamsulosin (FLOMAX) 0.4 MG CAPS capsule Take 1 capsule by mouth daily. 06/21/23  Yes [provider]  valsartan-hydrochlorothiazide (DIOVAN-HCT) 160-25 MG tablet Take 1 tablet by mouth daily.   Yes [provider]  allopurinol (ZYLOPRIM) 300 MG tablet Take 300 mg by mouth daily. Patient not taking: Reported on 10/04/2023    [provider]  amLODipine (NORVASC) 10 MG tablet Take 10 mg by mouth daily. Patient not taking: Reported on 10/04/2023    [provider]  capecitabine (XELODA) 500 MG tablet Take 3 tablets (1,500 mg total) by mouth 2 (two) times daily after a meal. Take Monday-Friday. Take only days of radiation. Patient not taking: Reported on 10/04/2023 08/25/23   [name], MD  LORazepam (ATIVAN) 0.5 MG tablet Take 1 tablet (0.5 mg total) by mouth every 8 (eight) hours as needed for anxiety. Patient not taking: Reported on 10/04/2023 09/29/23   Rana Snare, NP  predniSONE (DELTASONE) 10 MG tablet Take 1 tablet (10 mg total) by mouth daily with breakfast. Patient not taking: Reported on 10/04/2023 09/14/23   Rana Snare, NP     Vital Signs: BP (!) 106/47   Pulse 62   Temp 98 F (36.7 C) (Oral)   Resp 14   Ht 5\' 8"  (1.727 m)   Wt 185 lb (83.9 kg)   SpO2 96%   BMI 28.13 kg/m  Physical Exam Vitals and nursing note reviewed.  Constitutional:      General: He is not in acute distress.    Appearance: He is not toxic-appearing.  HENT:     Mouth/Throat:     Mouth: Mucous membranes are moist.     Pharynx: Oropharynx is clear.  Cardiovascular:     Rate and Rhythm: Normal rate and regular rhythm.  Pulmonary:     Effort: Pulmonary effort is normal.     Breath sounds: Normal breath sounds.  Abdominal:     Palpations: Abdomen is soft.     Tenderness: There is no abdominal tenderness.  There is no guarding or rebound.     Comments: Gallbladder drain in right upper lateral abdomen. Flushed without difficulty with 5ml saline. Clear mucous-like drainage with small pieces of gallstone/sludge noted. No external abnormality. Drain appears well.  Musculoskeletal:     Right lower leg: No edema.     Left lower leg: No edema.  Skin:    General: Skin is warm and dry.  Neurological:     Mental Status: He is alert and oriented to person, place, and time. Mental status is at baseline.     Imaging: CT ABDOMEN PELVIS W CONTRAST Result Date: 10/05/2023 CLINICAL DATA:  Abdominal pain, acute, nonlocalized post cholecystostomy catheter placement procedure pain EXAM: CT ABDOMEN AND PELVIS WITH CONTRAST TECHNIQUE: Multidetector CT imaging of the abdomen and pelvis was performed using the standard protocol following bolus administration of intravenous contrast. RADIATION DOSE REDUCTION: This exam was performed according to the departmental dose-optimization program which includes automated exposure control, adjustment of the mA and/or kV according to patient size and/or use of iterative reconstruction technique. CONTRAST:  75mL OMNIPAQUE IOHEXOL 300 MG/ML  SOLN COMPARISON:  10/03/2023 FINDINGS: Lower chest: Small pleural effusions right greater than left have developed. No pneumothorax. Patchy consolidation/atelectasis with air bronchograms posteriorly in the lung bases, new since previous. Hepatobiliary: Interval transhepatic pigtail cholecystostomy catheter placement, well position, with decompression of the gallbladder. Persistent gallbladder wall thickening in some regional inflammatory/edematous change. Metallic stent in the CBD stable, with pneumobilia implying patency of the stent. Stable scattered hepatic cysts, largest 1.6 cm in the posterior right lobe. Pancreas: Marked pancreatic ductal dilatation and parenchymal atrophy as before, with poor delineation of the pancreatic head mass, nearly  encircling the SMV and abutting the SMA. No new peripancreatic fluid collections. Spleen: Normal in size without focal abnormality. Adrenals/Urinary Tract: No adrenal lesion. 6 mm linear calcification in the right renal hilum probably atherosclerotic. No definite urolithiasis. No hydronephrosis or worrisome renal lesion. The urinary bladder is nondistended. Stomach/Bowel: Post gastroplasty. Patent stent to the second portion of the duodenum. Distal duodenum is physiologically distended. Small bowel is nondilated, with incomplete distal passage of the oral contrast material. Normal appendix. The colon is partially distended, without acute finding. Anastomotic staple line in the distal sigmoid segment. Vascular/Lymphatic: Moderate scattered aortoiliac calcified plaque without aneurysm. Narrowing of the distal superior mesenteric vein and proximal main portal vein; intrahepatic portal venous branches patent. No abdominal or pelvic adenopathy localized. Reproductive: Multiple metallic seeds in the prostate. Other: Small volume pelvic ascites, new since previous. No free air. Musculoskeletal: Post ventral hernia repair. Vertebral endplate spurring at multiple levels in the lower thoracic spine. Multilevel lumbar spondylitic change with fusion L3-L5. Chronic appearing sacral fracture deformity. IMPRESSION: 1. Interval cholecystostomy catheter placement, with decompression of the gallbladder, no hematoma or other apparent complication. 2. New small volume pelvic ascites. 3. New small pleural effusions right greater  than left, with patchy consolidation/atelectasis posteriorly in the lung bases. 4. Stable pancreatic head mass with pancreatic ductal dilatation, parenchymal atrophy, encasement of the portosplenic confluence. Electronically Signed   By: Corlis Leak M.D.   On: 10/05/2023 15:01   DG CHEST PORT 1 VIEW Result Date: 10/04/2023 CLINICAL DATA:  Hypoxia EXAM: PORTABLE CHEST 1 VIEW COMPARISON:  09/01/2023 FINDINGS:  Left pacer remains in place, unchanged. Heart is normal size. Aortic atherosclerosis. Bilateral lower lobe airspace opacities, slightly increased since prior study. No effusions or acute bony abnormality. IMPRESSION: Bilateral lower lobe atelectasis or infiltrates. Electronically Signed   By: Charlett Nose M.D.   On: 10/04/2023 18:27   IR Perc Cholecystostomy Result Date: 10/04/2023 INDICATION: Acute cholecystitis. Prior cholecystostomy drain placed 09/03/2023, dislodged 09/22/2023. EXAM: ULTRASOUND AND FLUOROSCOPIC-GUIDED CHOLECYSTOSTOMY TUBE PLACEMENT COMPARISON:  US Abdomen, 10/03/2023.  NM HIDA, 10/04/2023. MEDICATIONS: The patient is currently admitted to the hospital and on intravenous antibiotics. Antibiotics were administered within an appropriate time frame prior to skin puncture. ANESTHESIA/SEDATION: Moderate (conscious) sedation was employed during this procedure. A total of Versed 2 mg and Fentanyl 100 mcg was administered intravenously. Moderate Sedation Time: 21 minutes. The patient's level of consciousness and vital signs were monitored continuously by radiology nursing throughout the procedure under my direct supervision. CONTRAST:  20mL OMNIPAQUE IOHEXOL 300 MG/ML SOLN - administered into the gallbladder fossa. FLUOROSCOPY TIME:  Fluoroscopic dose; 6 mGy COMPLICATIONS: None immediate. PROCEDURE: Informed written consent was obtained from the patient after a discussion of the risks, benefits and alternatives to treatment. Questions regarding the procedure were encouraged and answered. A timeout was performed prior to the initiation of the procedure. The right upper abdominal quadrant was prepped and draped in the usual sterile fashion, and a sterile drape was applied covering the operative field. Maximum barrier sterile technique with sterile gowns and gloves were used for the procedure. A timeout was performed prior to the initiation of the procedure. Local anesthesia was provided with 1% lidocaine  with epinephrine. Ultrasound scanning of the RIGHT upper quadrant demonstrates a distended gallbladder. Utilizing a transhepatic approach, a 22 gauge needle was advanced into the gallbladder under direct ultrasound guidance. An ultrasound image was saved for documentation purposes. Appropriate intraluminal puncture was confirmed with the efflux of bile and advancement of an 0.018 wire into the gallbladder lumen. The needle was exchanged for an Accustick set. A small amount of contrast was injected to confirm appropriate intraluminal positioning. Over a Benson wire, a 10 Fr cholecystomy tube was advanced into the gallbladder fossa, coiled and locked. Bile was aspirated and a small amount of contrast was injected as several post procedural spot radiographic images were obtained in various obliquities. The catheter was secured to the skin with suture, connected to a drainage bag and a dressing was placed. The patient tolerated the procedure well without immediate post procedural complication. IMPRESSION: Successful ultrasound and fluoroscopic guided placement of a 10 Fr cholecystostomy tube. RECOMMENDATIONS: The patient will return to Vascular Interventional Radiology (VIR) for routine drainage catheter evaluation and exchange in 6-8 weeks. Roanna Banning, MD [facility] Electronically Signed   By: Roanna Banning M.D.   On: 10/04/2023 16:19   NM Hepatobiliary Liver Func Result Date: 10/04/2023 CLINICAL DATA:  Cholelithiasis and cholecystitis. Recent percutaneous cholecystostomy, which no longer in place. Internal biliary stent. Pancreatic carcinoma. EXAM: NUCLEAR MEDICINE HEPATOBILIARY IMAGING TECHNIQUE: Sequential images of the abdomen were obtained out to 60 minutes following intravenous administration of radiopharmaceutical. Because no gallbladder activity  was visualized at this time, 3 mg morphine was administered intravenously, and imaging was continued for  another 30 minutes RADIOPHARMACEUTICALS:  5.3 mCi Tc-71m  Choletec IV COMPARISON:  None Available. FINDINGS: Prompt uptake and biliary excretion of activity by the liver is seen. Biliary activity promptly passes into small bowel, consistent with patent common bile duct. No gallbladder activity is visualized, either before or following administration of intravenous morphine, consistent with cystic duct obstruction. Reflux of biliary activity into the stomach is also noted. IMPRESSION: Absence of gallbladder activity, consistent with cystic duct obstruction. No evidence of biliary obstruction. Bile reflux noted. Electronically Signed   By: Danae Orleans M.D.   On: 10/04/2023 10:33   US Abdomen Limited RUQ (LIVER/GB) Result Date: 10/03/2023 CLINICAL DATA:  [id number] Thickening of wall of gallbladder [id number] Cholelithiasis [id number] EXAM: ULTRASOUND ABDOMEN LIMITED RIGHT UPPER QUADRANT COMPARISON:  US Abdomen, 01/11/2023. CT of pelvis, 10/03/2023 and 09/01/2023. FINDINGS: Suboptimal evaluation, with poor acoustic penetration secondary to patient habitus. Gallbladder: Dependent echogenic gallstone, within a nondistended gallbladder, measuring up to 2.3 cm. Gallbladder wall thickening, measuring 0.6 cm with mild pericholecystic fluid. No sonographic Murphy sign noted by sonographer. Common bile duct: Diameter: Poorly visualized. Liver: Increased hepatic parenchymal echogenicity. Ovoid, hypoechoic RIGHT liver lesion measuring 2.2 x 1.4 x 1.5 cm without internal vascularity likely consistent with a cysts. Portal vein is patent on color Doppler imaging with normal direction of blood flow towards the liver. Other: No perihepatic fluid. IMPRESSION: 1. Nondistended gallbladder with gallstone, relative wall thickening and pericholecystic fluid. Examination is equivocal for calculus cholecystitis, consider NM HIDA for further evaluation if continued clinical concern. 2. Echogenic liver. Findings most commonly seen in hepatic  steatosis, though may also represent hepatitis and/or fibrosis. Electronically Signed   By: Roanna Banning M.D.   On: 10/03/2023 14:54   CT ABDOMEN PELVIS W CONTRAST Result Date: 10/03/2023 CLINICAL DATA:  Abdominal pain, acute, nonlocalized pancreatic cancer, hx of bilary stent, pain worse in ruq Right lower quadrant abdominal pain for 3 days with nausea. Recently completed radiation therapy. EXAM: CT ABDOMEN AND PELVIS WITH CONTRAST TECHNIQUE: Multidetector CT imaging of the abdomen and pelvis was performed using the standard protocol following bolus administration of intravenous contrast. RADIATION DOSE REDUCTION: This exam was performed according to the departmental dose-optimization program which includes automated exposure control, adjustment of the mA and/or kV according to patient size and/or use of iterative reconstruction technique. CONTRAST:  OMNIPAQUE IOHEXOL 300 MG/ML  SOLN COMPARISON:  Abdominopelvic CT 09/01/2023 and 08/14/2023. Abdominal radiographs 09/06/2023. FINDINGS: Lower chest: Chronic elevation of the left hemidiaphragm with associated chronic bibasilar atelectasis. The overall aeration of the left lung base has improved from the prior study. No significant pleural or pericardial effusion. Patient has a pacemaker. There is aortic and coronary artery atherosclerosis. Hepatobiliary: Grossly stable hepatic cysts. No suspicious liver lesions are identified. Metallic biliary stent is unchanged in position with persistent pneumobilia. Small gallstones, gallbladder wall thickening and pericholecystic inflammation, similar to previous CT. Since the prior CT, the patient had a cholecystostomy tube placed and removed. Pancreas: Grossly stable ill-defined 2.2 cm mass involving the pancreatic head on image 29/4. Persistent moderate pancreatic ductal dilatation. No acute peripancreatic fluid collections are identified. Spleen: Normal in size without focal abnormality. Adrenals/Urinary Tract: Both  adrenal glands appear normal. No evidence of urinary tract calculus, suspicious renal lesion or hydronephrosis. Unchanged renal cysts for which no specific follow-up imaging is recommended. The bladder appears unremarkable for its degree of distention. Stomach/Bowel: No enteric  contrast was administered. A duodenal stent is in place (placed 09/05/2023) and extending from the distal stomach into the 2nd portion of the duodenum, grossly unchanged from reference prior radiographs. There is fluid density within the stent lumen without soft tissue components to suggest tumor ingrowth. The stomach remains decompressed with stable postsurgical changes proximally. No significant bowel distension, wall thickening or surrounding inflammation. The appendix appears normal. There is a patent distal colonic anastomosis. Vascular/Lymphatic: No enlarged abdominopelvic lymph nodes are identified. Diffuse aortic and branch vessel atherosclerosis without evidence of aneurysm or acute large vessel occlusion. There is chronic venous occlusion at the portal splenic confluence. Reproductive: Prostate brachytherapy seeds. Other: Postsurgical changes in the anterior abdominal wall. Generalized subcutaneous and mesenteric edema has mildly progressed compared with the prior CT. No ascites, focal fluid collection or pneumoperitoneum demonstrated. Musculoskeletal: No acute or significant osseous findings. Multilevel spondylosis. IMPRESSION: 1. Cholelithiasis with gallbladder wall thickening and pericholecystic inflammation, similar to previous CT. Since the prior CT, the patient had a cholecystostomy tube placed and removed. Findings could reflect ongoing cholecystitis. 2. Interval duodenal stent placement with decompression of the stomach. 3. Grossly stable ill-defined pancreatic head mass with associated pancreatic ductal dilatation and chronic venous occlusion. No definite evidence of metastatic disease. 4. Metallic biliary stent remains in  place with persistent pneumobilia. 5. Generalized subcutaneous and mesenteric edema has mildly progressed compared with the prior CT. 6.  Aortic Atherosclerosis (ICD10-I70.0). Electronically Signed   By: Carey Bullocks M.D.   On: 10/03/2023 12:23    Labs:  CBC: Recent Labs    09/29/23 1412 10/03/23 0944 10/04/23 0350 10/05/23 0024 10/05/23 1617  WBC 2.8* 2.9* 2.5* 6.7  --   HGB 9.3* 9.1* 8.7* 9.0* 8.9*  HCT 29.7* 28.5* 28.0* 28.7* 29.0*  PLT 167 139* 118* 100*  --     COAGS: Recent Labs    12/14/22 0037 01/09/23 1635 09/01/23 2120 09/02/23 1747 09/03/23 0404 09/04/23 0101 09/04/23 1041 09/05/23 0500 09/06/23 0340 10/04/23 0350  INR 1.4* 1.3*  --  1.4*  --   --   --   --   --  1.4*  APTT  --   --    < >  --    < > 90* 94* 99* 63*  --    < > = values in this interval not displayed.    BMP: Recent Labs    09/29/23 1412 10/03/23 0944 10/04/23 0350 10/05/23 0024  NA 139 136 137 136  K 3.9 3.3* 3.1* 2.8*  CL 107 107 106 106  CO2 27 22 23  21*  GLUCOSE 119* 113* 93 144*  BUN 23 25* 22 29*  CALCIUM 8.8* 8.6* 8.3* 8.0*  CREATININE 0.95 1.06 1.17 1.45*  GFRNONAA >60 >60 >60 48*    LIVER FUNCTION TESTS: Recent Labs    09/07/23 0226 09/14/23 0903 09/29/23 1412 10/03/23 0944  BILITOT 0.6 0.4 0.3 0.6  AST 11* 13* 15 21  ALT 11 9 10 14   ALKPHOS 55 62 51 54  PROT 5.2* 6.2* 6.0* 6.0*  ALBUMIN 2.2* 3.3* 3.3* 2.9*    Assessment and Plan:  Patient is s/p cholecystostomy placement yesterday. Repeat CT scan today shows decompression of the gallbladder, no hematoma or other apparent complication. Pt reporting abdomen feeling less full, overall saying he feels ok.  Drain appears well. Flushed without difficulty today. I/O for last 24h 25ml.   Had acute drop in BP overnight, suspected sepsis per hospitalist and critical care. Will continue to monitor drain. Medical  care otherwise deferred to be led by medicine and critical care teams.  Plan: Continue TID flushes  with 5 cc NS. Record output Q shift. Dressing changes QD or PRN if soiled.  Call IR APP or on call IR MD if difficulty flushing or sudden change in drain output.  Repeat imaging/possible drain injection once output < 10 mL/QD (excluding flush material). Consideration for drain removal if output is < 10 mL/QD (excluding flush material), pending discussion with the providing surgical service.  IR will continue to follow - please call with questions or concerns.     Electronically Signed: Katheren Puller, PA-C 10/05/2023, 5:01 PM   I spent a total of 25 Minutes at the the patient's bedside AND on the patient's hospital floor or unit, greater than 50% of which was counseling/coordinating care for cholecystostomy tube placement.

## 2023-10-05 NOTE — Consult Note (Signed)
 [facility] Liaison Consult Note   10/05/2023  EYAN HAGOOD [birth year] [id number]  Insurance:  [place] [employer] Medicare  Primary Care Provider: [name], NP, with [facility] [place], this provider is listed for the transition of care follow up appointments  and community [facility] calls.  Patient also has VA listed   [facility] Liaison screened the patient remotely at [facility]. Patient is currently in Stepdown unit level of care.    The patient was screened for 30 day readmission hospitalization with noted extreme risk score for unplanned readmission risk 2 ED and 2 hospital admissions in 6 months.  The patient was assessed for potential [facility] Coordination service needs for post hospital transition for care coordination. Review of patient's electronic medical record reveals patient is note from home alone.   Plan: [facility] Liaison will continue to follow progress and disposition to assess for post hospital community care coordination/management needs.  Referral request for community care coordination: Anticipate follow up with [facility] team post hospital follow up.    [facility] does not replace or interfere with any arrangements made by the Inpatient Transition of Care team.   For questions contact:   Charlesetta Shanks, RN, BSN, CCM [place]  [facility], [facility] Liaison Direct Dial: [phone] or secure chat Email: [email or web address]

## 2023-10-05 NOTE — Progress Notes (Addendum)
 PROGRESS NOTE    Marcus RANSOME  [name]:[id number] DOB: 11-13-1940 DOA: 10/03/2023 PCP: [name], NP  Chief Complaint  Patient presents with   Abdominal Pain    Brief Narrative:   83 yo with hx pancreatic cancer, atrial fibrillation, hypertension, CAD, gout, prostate cancer who presented with recurrent abdominal pain.    He had been diagnosed with cholecystitis and s/p perc cholecystostomy tube placement on March 1st, 2025.  Also had duodenal stent placed during that hospitalization due to gastric outlet obstruction.  His cholecystostomy tube was removed by radiology on 3/20 after it was found to be displaced.  Since then, he developed recurrent symptoms and presented for another evaluation.  HIDA showed findings c/w cystic duct obstruction.  Now s/p perc chole placement on 4/1.  Assessment & Plan:   Principal Problem:   Acute cholecystitis Active Problems:   Pancreatic adenocarcinoma (HCC)   Atrial fibrillation, chronic (HCC)   Benign essential hypertension   GERD (gastroesophageal reflux disease)  Addendum Progressive hypotension, he's been intermittently fluid responsive.  Suspect 2/2 sepsis in the setting of cholecystitis below (now s/p perch chole tube placement 4/1).   S/p 2 L LR + 500 cc NS early this AM.   Continue zosyn for intraabdominal coverage.   Has been fluid responsive, BP dropping now.  Will start levophed.  Bed rest.  Follow echo.  Will consult PCCM.   His CT abd/pelvis from this AM is pending  Acute Cholecystitis  HIDA scan with absence of gallbladder activity, c/w cystic duct obstruction S/p perc chole placement 4/1 Follow culture results -> gram stain with gram positive cocci, gram negative rods, gram positive rods Continue zosyn  Rigors  Sepsis  Hypotension Fever, tachycardia, tachypnea - with cholecystitis above - also hypotension - c/w sepsis  No blood cultures collected - will collect if recurrent fever Continue zosyn for now  Abdominal  Pain Worse post procedure, notes this is worse than initial presenting abdominal pain Repeat CT abd/pelvis   Pancreatic [place] Follows with Dr. Alcide Evener outpatient Acute Kidney Injury  Baseline creatinine appears to be <1.0  Creatinine 1.45 today Will continue to monitor on IVF  Hypertension Amlodipine on hold with sepsis/hypotension   Chronic Atrial Fibrillation Eliquis is currently on hold, not on any rate control meds  Hypokalemia Replace and follow   Thrombocytopenia Trend  Anemia Hb appears stable  Hyperglycemia Follow A1c  BPH flomax    DVT prophylaxis: lovenox Code Status: DNR Family Communication: none Disposition:   Status is: Inpatient Remains inpatient appropriate because: continued uncontrolled post procedure pain   Consultants:  IR oncology  Procedures:   4/1 IMPRESSION: Successful ultrasound and fluoroscopic guided placement of Lynesha Bango 10 Fr cholecystostomy tube.  Antimicrobials:  Anti-infectives (From admission, onward)    Start     Dose/Rate Route Frequency Ordered Stop   10/03/23 2000  piperacillin-tazobactam (ZOSYN) IVPB 3.375 g        3.375 g 12.5 mL/hr over 240 Minutes Intravenous Every 8 hours 10/03/23 1415     10/03/23 1245  piperacillin-tazobactam (ZOSYN) IVPB 3.375 g        3.375 g 100 mL/hr over 30 Minutes Intravenous  Once 10/03/23 1241 10/03/23 1326       Subjective: C/o significant pain post procedure Notes this is worse than his pain prior to procedure  Objective: Vitals:   10/05/23 0300 10/05/23 0400 10/05/23 0500 10/05/23 0715  BP: (!) 95/42 (!) 92/35 (!) 103/38   Pulse: 68 66 61 65  Resp: 19 20 (!)  6 17  Temp:  97.9 F (36.6 C)    TempSrc:  Oral    SpO2: 93% (!) 89% 95% 93%  Weight:      Height:        Intake/Output Summary (Last 24 hours) at 10/05/2023 6045 Last data filed at 10/05/2023 4098 Gross per 24 hour  Intake 359.13 ml  Output 250 ml  Net 109.13 ml   Filed Weights   10/03/23 0920  Weight: 83.9  kg    Examination:  General exam: Appears uncomfortable, sitting up in chair, pain with deep breaths Respiratory system: unlabored, diminished Cardiovascular system: RRR Gastrointestinal system: TTP to RUQ, soft, mildly distended Central nervous system: Alert and oriented. No focal neurological deficits. Extremities: no LEE   Data Reviewed: I have personally reviewed following labs and imaging studies  CBC: Recent Labs  Lab 09/29/23 1412 10/03/23 0944 10/04/23 0350 10/05/23 0024  WBC 2.8* 2.9* 2.5* 6.7  NEUTROABS 1.9  --   --  6.1  HGB 9.3* 9.1* 8.7* 9.0*  HCT 29.7* 28.5* 28.0* 28.7*  MCV 84.6 85.3 88.9 87.0  PLT 167 139* 118* 100*    Basic Metabolic Panel: Recent Labs  Lab 09/29/23 1412 10/03/23 0944 10/04/23 0350 10/05/23 0024  NA 139 136 137 136  K 3.9 3.3* 3.1* 2.8*  CL 107 107 106 106  CO2 27 22 23  21*  GLUCOSE 119* 113* 93 144*  BUN 23 25* 22 29*  CREATININE 0.95 1.06 1.17 1.45*  CALCIUM 8.8* 8.6* 8.3* 8.0*  MG  --   --   --  1.8    GFR: Estimated Creatinine Clearance: 40.7 mL/min (Kaion Tisdale) (by C-G formula based on SCr of 1.45 mg/dL (H)).  Liver Function Tests: Recent Labs  Lab 09/29/23 1412 10/03/23 0944  AST 15 21  ALT 10 14  ALKPHOS 51 54  BILITOT 0.3 0.6  PROT 6.0* 6.0*  ALBUMIN 3.3* 2.9*    CBG: Recent Labs  Lab 10/04/23 1755  GLUCAP 88     Recent Results (from the past 240 hours)  Aerobic/Anaerobic Culture w Gram Stain (surgical/deep wound)     Status: None (Preliminary result)   Collection Time: 10/04/23  3:04 PM   Specimen: BILE  Result Value Ref Range Status   Specimen Description   Final    BILE Performed at [facility], [address], [place], [place] [address]    Special Requests   Final    NONE Performed at [facility], [address], [place], [place] [address]    Gram Stain   Final    FEW WBC PRESENT,BOTH PMN AND MONONUCLEAR ABUNDANT GRAM POSITIVE COCCI MODERATE GRAM  NEGATIVE RODS RARE GRAM POSITIVE RODS Performed at [facility], [address] [place], [place] [address]    Culture PENDING  Incomplete   Report Status PENDING  Incomplete  MRSA Next Gen by PCR, Nasal     Status: None   Collection Time: 10/04/23  6:31 PM   Specimen: Nasal Mucosa; Nasal Swab  Result Value Ref Range Status   MRSA by PCR Next Gen NOT DETECTED NOT DETECTED Final    Comment: (NOTE) The GeneXpert MRSA Assay (FDA approved for NASAL specimens only), is one component of [facility] comprehensive MRSA colonization surveillance program. It is not intended to diagnose MRSA infection nor to guide or monitor treatment for MRSA infections. Test performance is not FDA approved in patients less than 4 years old. Performed at [facility], [address] [place], [place] [address]          Radiology Studies: DG CHEST PORT 1 VIEW Result Date: 10/04/2023 CLINICAL DATA:  Hypoxia EXAM: PORTABLE CHEST 1 VIEW COMPARISON:  09/01/2023 FINDINGS: Left pacer remains in place, unchanged. Heart is normal size. Aortic atherosclerosis. Bilateral lower lobe airspace opacities, slightly increased since prior study. No effusions or acute bony abnormality. IMPRESSION: Bilateral lower lobe atelectasis or infiltrates. Electronically Signed   By: Charlett Nose M.D.   On: 10/04/2023 18:27   IR Perc Cholecystostomy Result Date: 10/04/2023 INDICATION: Acute cholecystitis. Prior cholecystostomy drain placed 09/03/2023, dislodged 09/22/2023. EXAM: ULTRASOUND AND FLUOROSCOPIC-GUIDED CHOLECYSTOSTOMY TUBE PLACEMENT COMPARISON:  US Abdomen, 10/03/2023.  NM HIDA, 10/04/2023. MEDICATIONS: The patient is currently admitted to the hospital and on intravenous antibiotics. Antibiotics were administered within an appropriate time frame prior to skin puncture. ANESTHESIA/SEDATION: Moderate (conscious) sedation was employed during this procedure. Norvin Ohlin total of Versed 2 mg and Fentanyl 100 mcg was administered  intravenously. Moderate Sedation Time: 21 minutes. The patient's level of consciousness and vital signs were monitored continuously by radiology nursing throughout the procedure under my direct supervision. CONTRAST:  20mL OMNIPAQUE IOHEXOL 300 MG/ML SOLN - administered into the gallbladder fossa. FLUOROSCOPY TIME:  Fluoroscopic dose; 6 mGy COMPLICATIONS: None immediate. PROCEDURE: Informed written consent was obtained from the patient after Jakwan Sally discussion of the risks, benefits and alternatives to treatment. Questions regarding the procedure were encouraged and answered. Skyleen Bentley timeout was performed prior to the initiation of the procedure. The right upper abdominal quadrant was prepped and draped in the usual sterile fashion, and Seana Underwood sterile drape was applied covering the operative field. Maximum barrier sterile technique with sterile gowns and gloves were used for the procedure. TRUE Shackleford timeout was performed prior to the initiation of the procedure. Local anesthesia was provided with 1% lidocaine with epinephrine. Ultrasound scanning of the RIGHT upper quadrant demonstrates Lewayne Pauley distended gallbladder. Utilizing Lizzete Gough transhepatic approach, Jordayn Mink 22 gauge needle was advanced into the gallbladder under direct ultrasound guidance. An ultrasound image was saved for documentation purposes. Appropriate intraluminal puncture was confirmed with the efflux of bile and advancement of an 0.018 wire into the gallbladder lumen. The needle was exchanged for an Accustick set. Erric Machnik small amount of contrast was injected to confirm appropriate intraluminal positioning. Over Leoncio Hansen Benson wire, Arabel Barcenas 10 Fr cholecystomy tube was advanced into the gallbladder fossa, coiled and locked. Bile was aspirated and Yocelin Vanlue small amount of contrast was injected as several post procedural spot radiographic images were obtained in various obliquities. The catheter was secured to the skin with suture, connected to Reana Chacko drainage bag and Chiana Wamser dressing was placed. The patient tolerated the  procedure well without immediate post procedural complication. IMPRESSION: Successful ultrasound and fluoroscopic guided placement of Thanya Cegielski 10 Fr cholecystostomy tube. RECOMMENDATIONS: The patient will return to Vascular Interventional Radiology (VIR) for routine drainage catheter evaluation and exchange in 6-8 weeks. Roanna Banning, MD [facility] Electronically Signed   By: Roanna Banning M.D.   On: 10/04/2023 16:19   NM Hepatobiliary Liver Func Result Date: 10/04/2023 CLINICAL DATA:  Cholelithiasis and cholecystitis. Recent percutaneous cholecystostomy, which no longer in place. Internal biliary stent. Pancreatic carcinoma. EXAM: NUCLEAR MEDICINE HEPATOBILIARY IMAGING TECHNIQUE: Sequential images of the abdomen were obtained out to 60 minutes following intravenous administration of radiopharmaceutical. Because no gallbladder activity was visualized at this time, 3 mg morphine was administered intravenously, and imaging was continued for another 30 minutes RADIOPHARMACEUTICALS:  5.3 mCi Tc-63m  Choletec IV COMPARISON:  None Available. FINDINGS: Prompt uptake and biliary excretion of activity by the liver is seen. Biliary activity promptly passes into small bowel, consistent with patent common bile duct. No gallbladder activity is visualized, either before or following administration of intravenous morphine, consistent with cystic duct obstruction. Reflux of biliary activity into the stomach is also noted. IMPRESSION: Absence of gallbladder activity, consistent with cystic duct obstruction. No evidence of biliary obstruction. Bile reflux noted. Electronically Signed   By: Danae Orleans M.D.   On: 10/04/2023 10:33   US Abdomen Limited RUQ (LIVER/GB) Result Date: 10/03/2023 CLINICAL DATA:  [id number] Thickening of wall of gallbladder [id number] Cholelithiasis [id number] EXAM: ULTRASOUND ABDOMEN LIMITED RIGHT UPPER QUADRANT COMPARISON:  US Abdomen, 01/11/2023. CT of  pelvis, 10/03/2023 and 09/01/2023. FINDINGS: Suboptimal evaluation, with poor acoustic penetration secondary to patient habitus. Gallbladder: Dependent echogenic gallstone, within Violette Morneault nondistended gallbladder, measuring up to 2.3 cm. Gallbladder wall thickening, measuring 0.6 cm with mild pericholecystic fluid. No sonographic Murphy sign noted by sonographer. Common bile duct: Diameter: Poorly visualized. Liver: Increased hepatic parenchymal echogenicity. Ovoid, hypoechoic RIGHT liver lesion measuring 2.2 x 1.4 x 1.5 cm without internal vascularity likely consistent with Johnrobert Foti cysts. Portal vein is patent on color Doppler imaging with normal direction of blood flow towards the liver. Other: No perihepatic fluid. IMPRESSION: 1. Nondistended gallbladder with gallstone, relative wall thickening and pericholecystic fluid. Examination is equivocal for calculus cholecystitis, consider NM HIDA for further evaluation if continued clinical concern. 2. Echogenic liver. Findings most commonly seen in hepatic steatosis, though may also represent hepatitis and/or fibrosis. Electronically Signed   By: Roanna Banning M.D.   On: 10/03/2023 14:54   CT ABDOMEN PELVIS W CONTRAST Result Date: 10/03/2023 CLINICAL DATA:  Abdominal pain, acute, nonlocalized pancreatic cancer, hx of bilary stent, pain worse in ruq Right lower quadrant abdominal pain for 3 days with nausea. Recently completed radiation therapy. EXAM: CT ABDOMEN AND PELVIS WITH CONTRAST TECHNIQUE: Multidetector CT imaging of the abdomen and pelvis was performed using the standard protocol following bolus administration of intravenous contrast. RADIATION DOSE REDUCTION: This exam was performed according to the departmental dose-optimization program which includes automated exposure control, adjustment of the mA and/or kV according to patient size and/or use of iterative reconstruction technique. CONTRAST:  OMNIPAQUE IOHEXOL 300 MG/ML  SOLN COMPARISON:  Abdominopelvic CT  09/01/2023 and 08/14/2023. Abdominal radiographs 09/06/2023. FINDINGS: Lower chest: Chronic elevation of the left hemidiaphragm with associated chronic bibasilar atelectasis. The overall aeration of the left lung base has improved from the prior study. No significant pleural or pericardial effusion. Patient has Curley Fayette pacemaker. There is aortic and coronary artery atherosclerosis. Hepatobiliary: Grossly stable hepatic cysts. No suspicious liver lesions are identified. Metallic biliary stent is unchanged in position with persistent pneumobilia. Small gallstones, gallbladder wall thickening and pericholecystic inflammation, similar to previous CT. Since the prior CT, the patient had Marquavius Scaife cholecystostomy tube placed and removed. Pancreas: Grossly stable ill-defined 2.2 cm mass involving the pancreatic head on image 29/4. Persistent moderate pancreatic ductal dilatation. No acute peripancreatic fluid collections are identified. Spleen: Normal in size without focal abnormality. Adrenals/Urinary Tract: Both adrenal glands appear normal. No evidence of urinary tract calculus, suspicious renal lesion or hydronephrosis. Unchanged renal cysts for which no specific follow-up imaging is recommended. The bladder appears unremarkable for its degree of distention. Stomach/Bowel: No enteric contrast was administered. Lagina Reader duodenal stent is in place (placed 09/05/2023) and extending from the distal stomach into the 2nd portion of the duodenum, grossly unchanged  from reference prior radiographs. There is fluid density within the stent lumen without soft tissue components to suggest tumor ingrowth. The stomach remains decompressed with stable postsurgical changes proximally. No significant bowel distension, wall thickening or surrounding inflammation. The appendix appears normal. There is Deshundra Waller patent distal colonic anastomosis. Vascular/Lymphatic: No enlarged abdominopelvic lymph nodes are identified. Diffuse aortic and branch vessel  atherosclerosis without evidence of aneurysm or acute large vessel occlusion. There is chronic venous occlusion at the portal splenic confluence. Reproductive: Prostate brachytherapy seeds. Other: Postsurgical changes in the anterior abdominal wall. Generalized subcutaneous and mesenteric edema has mildly progressed compared with the prior CT. No ascites, focal fluid collection or pneumoperitoneum demonstrated. Musculoskeletal: No acute or significant osseous findings. Multilevel spondylosis. IMPRESSION: 1. Cholelithiasis with gallbladder wall thickening and pericholecystic inflammation, similar to previous CT. Since the prior CT, the patient had Rehema Muffley cholecystostomy tube placed and removed. Findings could reflect ongoing cholecystitis. 2. Interval duodenal stent placement with decompression of the stomach. 3. Grossly stable ill-defined pancreatic head mass with associated pancreatic ductal dilatation and chronic venous occlusion. No definite evidence of metastatic disease. 4. Metallic biliary stent remains in place with persistent pneumobilia. 5. Generalized subcutaneous and mesenteric edema has mildly progressed compared with the prior CT. 6.  Aortic Atherosclerosis (ICD10-I70.0). Electronically Signed   By: Carey Bullocks M.D.   On: 10/03/2023 12:23        Scheduled Meds:  Chlorhexidine Gluconate Cloth  6 each Topical Daily   enoxaparin (LOVENOX) injection  40 mg Subcutaneous Q24H    morphine injection  3 mg Intravenous Once   ondansetron (ZOFRAN) IV  4 mg Intravenous Once   potassium chloride  40 mEq Oral Once   sodium chloride flush  10-40 mL Intracatheter Q12H   sodium chloride flush  5 mL Intracatheter Q8H   tamsulosin  0.4 mg Oral Daily   Continuous Infusions:  piperacillin-tazobactam (ZOSYN)  IV 12.5 mL/hr at 10/05/23 0722     LOS: 2 days    Time spent: 50 min critical care time with hypotension requiring pressors    Lacretia Nicks, MD [facility]   To contact the  attending provider between 7A-7P or the covering provider during after hours 7P-7A, please log into the web site [email or web address] and access using universal [place] password for that web site. If you do not have the password, please call the hospital operator.  10/05/2023, 8:12 AM

## 2023-10-05 NOTE — Progress Notes (Signed)
 PT expressed having a headache and feeling dizzy to nurse. Bps have been low since arrival on unit. X1 bolus given on previous shift with no change noted by night shift nurse. PT BP rechecked at this time since it was the first mention of symptoms accompanying low blood pressures. SEE flow sheet for serial blood pressures, cuff size changed from small to medium which seems to be a more appropriate size, BP unchanged. Manual cuff pressure taken by nurse and found to be 92/40 in the right arm. Provider notified, x1LR bolus ordered and started, lactic ordered and collected off port by nurse. Will continue to assess for changes in condition.

## 2023-10-05 NOTE — Consult Note (Signed)
 NAME:  Marcus [name], MRN:  [id number], DOB:  [birth year], LOS: 2 ADMISSION DATE:  10/03/2023, CONSULTATION DATE:  10/05/23 REFERRING MD:  Dr. Lowell Guitar, CHIEF COMPLAINT:  hypotension   History of Present Illness:   70 yoM with PMH significant for but not limited to afib on eliquis, HTN, CAD, combined systolic and diastolic HF, AV block w/ PPM, OSA on CPAP, pancreatic cancer (dx 11/2022), and recent acute cholecystitis s/p cholecystostomy tube placement on 3/1 as not a surgery candidate and duodenal stent placement 2/2 gastric outlet obstruction.  Cholecystostomy tube removed on 3/1 after found to be  dislodged.  Presented back with recurrent abd pain and nausea without vomiting x 3 days, no fevers.  Pt neutropenicEmpiric zosyn started.  Underwent CT a/p, RUQ [place] and HIDA scan with findings of ongoing cholecystitis and recurrent cystic duct obstruction.  Underwent IR placement of percutaneous cholecystostomy tube.  Since pt has had progressive hypotension despite LR 2L bolus and started on peripheral norepinephrine.  Lactic this am  2.7.  New AKI and minimal UOP today. Repeat CT a/p pending.  PCCM consulted for further management.   Pt denies any specific complaints currently.  Had some upper abd pain worse with inspiration earlier but pain has been well managed.  Slight dizziness earlier now resolved, maybe slight headache.  No appetite.  Reports has barely ate anything since Sunday and noted 35lb wt loss since January of this year.   Pertinent  Medical History   Past Medical History:  Diagnosis Date   Acute meniscal tear of knee left   Arthritis    generalized   AV block 08/01/2018   Benign essential hypertension 01/18/2007   Bronchiectasis with acute exacerbation 06/23/2020   Chest pain, atypical 08/23/2014   Chronic cough 10/07/2014   Coronary artery disease    Degeneration of lumbar intervertebral disc 10/14/2017   Diverticulosis of colon 07/07/2005   Elevated PSA 06/19/2014   GERD  (gastroesophageal reflux disease) 01/18/2007   Gout, unspecified 01/18/2007   Hemorrhoids 01/19/2011   Hiatal hernia    History of colonic polyps 01/18/2007   Insomnia, unspecified 08/21/2007   Iron deficiency anemia, unspecified  01/19/2011   Lumbar post-laminectomy syndrome 10/14/2017   Lumbar spondylolysis    Lumbar strain 12/14/2011   Obstructive sleep apnea 01/18/2007   uses CPAP nightly   Paraesophageal hernia    large   Primary osteoarthritis of knee 05/23/2015   Prostate cancer (HCC) 2016   S/P knee replacement 05/23/2015   Sensorineural hearing loss, bilateral    Vitamin B12 deficiency    Vitamin D deficiency 10/28/2009   Significant Hospital Events: Including procedures, antibiotic start and stop dates in addition to other pertinent events   3/31 admit 4/2 PCCM consult  Interim History / Subjective:  NE 2 mcg  Objective   Blood pressure (!) 69/33, pulse 60, temperature 97.8 F (36.6 C), temperature source Oral, resp. rate 18, height 5\' 8"  (1.727 m), weight 83.9 kg, SpO2 97%.        Intake/Output Summary (Last 24 hours) at 10/05/2023 1433 Last data filed at 10/05/2023 1431 Gross per 24 hour  Intake 2848.32 ml  Output 275 ml  Net 2573.32 ml   Filed Weights   10/03/23 0920  Weight: 83.9 kg    Examination: General:  Pleasant elderly male sitting in bed in NAD HEENT: MM pink/minimally moist, normal JVP, pupils 2/r, slight temporal wasting Neuro: Alert, oriented x3, MAE CV: AV paced, +2 distal pulses, port right chest accessed PULM:  non labored, CTA, on room air GI: soft, no guarding, +bs, no suprapubic pain, RUQ biliary drain with light bilious drainage Extremities: warm/dry, no tibial edema, trace ankle edema Skin: no rashes   Labs reviewed> WBC 2.5> 6.7, H/H 9/ 28, plts 118> 100, K 2.8, bicarb 21, BUN/ sCr 22/ 1.17> 29/ 1.45, Mag 1.8   Resolved Hospital Problem list    Assessment & Plan:   Septic shock 2/2 acute cholecystitis and cystic duct  obstruction s/p IR perc chole tube placement 4/1 Lactic acidosis  - bedside POCUS with no variation in IVC, cont pressor support for now   - cont NE for MAP goal > 60, ideally 65 (low diastolic pressures noted) - recheck lactic and check H/H for completeness  - send BC - pending repeat CT a/p - follow fluid cultures from  - cont zosyn   AKI- likely multifactorial to poor PO intake, sepsis, hypotension, and contrast Hypokalemia Hx BPH - monitor for urinary retention, bladder prn> currently 50 ml - s/p KCL replete - hemodynamic support as above  - flomax - trend renal indices  - strict I/Os, daily wts - avoid nephrotoxins, renal dose meds, hemodynamic support as above   HTN Hx HFpEF, HFrEF - prior echo 45-50%, G1DD, normal RV - hold anti-hypertensive's with hypotension- norvasc, valsartan-HCTZ   Thrombocytopenia- suspected related to sepsis  Normocytic anemia - H/H remains stable.  Plts 139> 118> 100, continue to monitor  - trend CBC   Afib - last dose eliquis 3/30.  Remains currently AV paced - cont to optimize electrolytes - cont to hold eliquis   Pancreatic adenocarcinoma  - Followed by Dr. Truett Perna.  As of 3/27, decided to hold on further radiation and xeloda treatments, referred to palliative care and started on prn ativan.  He is a DNR/ DNI.     Failure to thrive  Protein calorie malnutrition - 35lb wt loss since January.  Currently NPO with sips for now.  Maximize nutrition when able   Best Practice (right click and "Reselect all SmartList Selections" daily)   Diet/type: NPO w/ oral meds DVT prophylaxis SCD Pressure ulcer(s): N/A GI prophylaxis: N/A Lines: N/A- port right chest accessed Foley:  N/A Code Status:  DNR/ DNI Last date of multidisciplinary goals of care discussion [ongoing]  Labs   CBC: Recent Labs  Lab 09/29/23 1412 10/03/23 0944 10/04/23 0350 10/05/23 0024  WBC 2.8* 2.9* 2.5* 6.7  NEUTROABS 1.9  --   --  6.1  HGB 9.3* 9.1* 8.7*  9.0*  HCT 29.7* 28.5* 28.0* 28.7*  MCV 84.6 85.3 88.9 87.0  PLT 167 139* 118* 100*    Basic Metabolic Panel: Recent Labs  Lab 09/29/23 1412 10/03/23 0944 10/04/23 0350 10/05/23 0024  NA 139 136 137 136  K 3.9 3.3* 3.1* 2.8*  CL 107 107 106 106  CO2 27 22 23  21*  GLUCOSE 119* 113* 93 144*  BUN 23 25* 22 29*  CREATININE 0.95 1.06 1.17 1.45*  CALCIUM 8.8* 8.6* 8.3* 8.0*  MG  --   --   --  1.8   GFR: Estimated Creatinine Clearance: 40.7 mL/min (A) (by C-G formula based on SCr of 1.45 mg/dL (H)). Recent Labs  Lab 09/29/23 1412 10/03/23 0944 10/03/23 1146 10/03/23 1448 10/04/23 0350 10/05/23 0024 10/05/23 1058  WBC 2.8* 2.9*  --   --  2.5* 6.7  --   LATICACIDVEN  --   --  0.6 0.7  --   --  2.7*    Liver  Function Tests: Recent Labs  Lab 09/29/23 1412 10/03/23 0944  AST 15 21  ALT 10 14  ALKPHOS 51 54  BILITOT 0.3 0.6  PROT 6.0* 6.0*  ALBUMIN 3.3* 2.9*   Recent Labs  Lab 10/03/23 0944  LIPASE 23   No results for input(s): "AMMONIA" in the last 168 hours.  ABG    Component Value Date/Time   PHART 7.4 10/04/2023 1733   PCO2ART 36 10/04/2023 1733   PO2ART 188 (H) 10/04/2023 1733   HCO3 22.3 10/04/2023 1733   TCO2 26 09/01/2023 1454   ACIDBASEDEF 2.0 10/04/2023 1733   O2SAT 100 10/04/2023 1733     Coagulation Profile: Recent Labs  Lab 10/04/23 0350  INR 1.4*    Cardiac Enzymes: No results for input(s): "CKTOTAL", "CKMB", "CKMBINDEX", "TROPONINI" in the last 168 hours.  HbA1C: Hgb A1c MFr Bld  Date/Time Value Ref Range Status  11/24/2022 03:27 PM 6.3 4.6 - 6.5 % Final    Comment:    Glycemic Control Guidelines for People with Diabetes:Non Diabetic:  <6%Goal of Therapy: <7%Additional Action Suggested:  >8%   03/04/2017 08:08 AM 6.0 4.6 - 6.5 % Final    Comment:    Glycemic Control Guidelines for People with Diabetes:Non Diabetic:  <6%Goal of Therapy: <7%Additional Action Suggested:  >8%     CBG: Recent Labs  Lab 10/04/23 1755  10/05/23 1126  GLUCAP 88 225*    Review of Systems:   Review of Systems  Constitutional:  Positive for malaise/fatigue and weight loss. Negative for chills and fever.  Respiratory:  Negative for cough, sputum production and shortness of breath.   Cardiovascular:  Negative for chest pain and leg swelling.  Gastrointestinal:  Positive for abdominal pain. Negative for diarrhea and vomiting.  Genitourinary:  Negative for dysuria.  Neurological:  Positive for dizziness and headaches. Negative for focal weakness.   Past Medical History:  He,  has a past medical history of Acute meniscal tear of knee (left), Arthritis, AV block (08/01/2018), Benign essential hypertension (01/18/2007), Bronchiectasis with acute exacerbation (06/23/2020), Chest pain, atypical (08/23/2014), Chronic cough (10/07/2014), Coronary artery disease, Degeneration of lumbar intervertebral disc (10/14/2017), Diverticulosis of colon (07/07/2005), Elevated PSA (06/19/2014), GERD (gastroesophageal reflux disease) (01/18/2007), Gout, unspecified (01/18/2007), Hemorrhoids (01/19/2011), Hiatal hernia, History of colonic polyps (01/18/2007), Insomnia, unspecified (08/21/2007), Iron deficiency anemia, unspecified  (01/19/2011), Lumbar post-laminectomy syndrome (10/14/2017), Lumbar spondylolysis, Lumbar strain (12/14/2011), Obstructive sleep apnea (01/18/2007), Paraesophageal hernia, Primary osteoarthritis of knee (05/23/2015), Prostate cancer (HCC) (2016), S/P knee replacement (05/23/2015), Sensorineural hearing loss, bilateral, Vitamin B12 deficiency, and Vitamin D deficiency (10/28/2009).   Surgical History:   Past Surgical History:  Procedure Laterality Date   BILIARY BRUSHING  12/13/2022   Procedure: BILIARY BRUSHING;  Surgeon: Iva Boop, MD;  Location: [facility] ENDOSCOPY;  Service: Gastroenterology;;   BILIARY STENT PLACEMENT  12/13/2022   Procedure: BILIARY STENT PLACEMENT;  Surgeon: Iva Boop, MD;  Location: [facility] ENDOSCOPY;   Service: Gastroenterology;;   BILIARY STENT PLACEMENT N/A 02/07/2023   Procedure: BILIARY STENT PLACEMENT;  Surgeon: Lemar Lofty., MD;  Location: [facility] ENDOSCOPY;  Service: Gastroenterology;  Laterality: N/A;   BIOPSY  12/23/2022   Procedure: BIOPSY;  Surgeon: Meridee Score Netty Starring., MD;  Location: [facility] ENDOSCOPY;  Service: Gastroenterology;;   BIOPSY  09/05/2023   Procedure: BIOPSY;  Surgeon: Lemar Lofty., MD;  Location: [facility] ENDOSCOPY;  Service: Gastroenterology;;   BRONCHIAL WASHINGS  10/30/2019   Procedure: BRONCHIAL WASHINGS;  Surgeon: [name], DO;  Location: [facility] ENDOSCOPY;  Service:  Endoscopy;;   CARDIAC CATHETERIZATION     CATARACT EXTRACTION W/ INTRAOCULAR LENS  IMPLANT, BILATERAL Bilateral    COLONOSCOPY     DUODENAL STENT PLACEMENT N/A 09/05/2023   Procedure: INSERTION, STENT, DUODENUM;  Surgeon: Lemar Lofty., MD;  Location: [facility] ENDOSCOPY;  Service: Gastroenterology;  Laterality: N/A;   ENDOSCOPIC RETROGRADE CHOLANGIOPANCREATOGRAPHY (ERCP) WITH PROPOFOL N/A 02/07/2023   Procedure: ENDOSCOPIC RETROGRADE CHOLANGIOPANCREATOGRAPHY (ERCP) WITH PROPOFOL;  Surgeon: Meridee Score Netty Starring., MD;  Location: [facility] ENDOSCOPY;  Service: Gastroenterology;  Laterality: N/A;  Stent change   ERCP N/A 12/13/2022   Procedure: ENDOSCOPIC RETROGRADE CHOLANGIOPANCREATOGRAPHY (ERCP);  Surgeon: Iva Boop, MD;  Location: [facility] ENDOSCOPY;  Service: Gastroenterology;  Laterality: N/A;  with stent placement   ESOPHAGOGASTRODUODENOSCOPY     ESOPHAGOGASTRODUODENOSCOPY (EGD) WITH PROPOFOL N/A 12/23/2022   Procedure: ESOPHAGOGASTRODUODENOSCOPY (EGD) WITH PROPOFOL;  Surgeon: Meridee Score Netty Starring., MD;  Location: [facility] ENDOSCOPY;  Service: Gastroenterology;  Laterality: N/A;   ESOPHAGOGASTRODUODENOSCOPY (EGD) WITH PROPOFOL N/A 09/05/2023   Procedure: ESOPHAGOGASTRODUODENOSCOPY (EGD) WITH PROPOFOL;  Surgeon: Meridee Score Netty Starring., MD;  Location: [facility] ENDOSCOPY;  Service: Gastroenterology;  Laterality:  N/A;   EUS N/A 12/23/2022   Procedure: UPPER ENDOSCOPIC ULTRASOUND (EUS) RADIAL;  Surgeon: Lemar Lofty., MD;  Location: [facility] ENDOSCOPY;  Service: Gastroenterology;  Laterality: N/A;   FINE NEEDLE ASPIRATION  12/23/2022   Procedure: FINE NEEDLE ASPIRATION (FNA) RADIAL;  Surgeon: Lemar Lofty., MD;  Location: [facility] ENDOSCOPY;  Service: Gastroenterology;;   IR IMAGING GUIDED PORT INSERTION  01/04/2023   IR PERC CHOLECYSTOSTOMY  09/03/2023   IR PERC CHOLECYSTOSTOMY  10/04/2023   IR SINUS/FIST TUBE CHK-NON GI  09/22/2023   KNEE ARTHROSCOPY Right 2007   KNEE ARTHROSCOPY  07/13/2011   Procedure: ARTHROSCOPY KNEE;  Surgeon: Erasmo Leventhal;  Location:  SURGERY CENTER;  Service: Orthopedics;  Laterality: Left;  partial menisectomy with chondrylplasty   KNEE SURGERY Left    LAMINECTOMY AND MICRODISCECTOMY LUMBAR SPINE  MARCH  2008   L3 -  4   LAPAROSCOPIC INCISIONAL / UMBILICAL / VENTRAL HERNIA REPAIR  2006   PACEMAKER IMPLANT N/A 08/02/2018   Procedure: PACEMAKER IMPLANT;  Surgeon: Marinus Maw, MD;  Location: [facility] INVASIVE CV LAB;  Service: Cardiovascular;  Laterality: N/A;   PROSTATE BIOPSY     RADIOACTIVE SEED IMPLANT N/A 02/12/2019   Procedure: RADIOACTIVE SEED IMPLANT/BRACHYTHERAPY IMPLANT;  Surgeon: Marcine Matar, MD;  Location: [facility] ORS;  Service: Urology;  Laterality: N/A;  90 MINS   REMOVAL OF STONES  02/07/2023   Procedure: REMOVAL OF Sludge;  Surgeon: Meridee Score Netty Starring., MD;  Location: [facility] ENDOSCOPY;  Service: Gastroenterology;;   SHOULDER ARTHROSCOPY Right 05-23-2007   SIGMOID COLECTOMY FOR CANCER  1989   SPACE OAR INSTILLATION N/A 02/12/2019   Procedure: SPACE OAR INSTILLATION;  Surgeon: Marcine Matar, MD;  Location: [facility] ORS;  Service: Urology;  Laterality: N/A;   SPHINCTEROTOMY  12/13/2022   Procedure: SPHINCTEROTOMY;  Surgeon: Iva Boop, MD;  Location: [facility] ENDOSCOPY;  Service: Gastroenterology;;   Francine Graven REMOVAL  02/07/2023   Procedure: STENT REMOVAL;   Surgeon: Lemar Lofty., MD;  Location: [facility] ENDOSCOPY;  Service: Gastroenterology;;   TOTAL KNEE ARTHROPLASTY Left 05/23/2015   Procedure: LEFT TOTAL KNEE ARTHROPLASTY;  Surgeon: Eugenia Mcalpine, MD;  Location: [facility] ORS;  Service: Orthopedics;  Laterality: Left;   VERTICAL BANDED GASTROPLASTY  1986   VIDEO BRONCHOSCOPY N/A 10/30/2019   Procedure: VIDEO BRONCHOSCOPY WITH BRONICAL ALVEROLAR LAVAGE WITHOUT FLUORO;  Surgeon: [name], DO;  Location: [facility] ENDOSCOPY;  Service: Endoscopy;  Laterality: N/A;     Social History:   reports that he quit smoking about 49 years ago. His smoking use included cigarettes. He started smoking about 69 years ago. He has a 40 pack-year smoking history. He has never used smokeless tobacco. He reports current alcohol use. He reports that he does not use drugs.   Family History:  His family history includes Breast cancer in his mother; Colon cancer in his maternal grandmother; Esophagitis in his father; Heart disease in his paternal grandfather; Stroke in his paternal grandfather. There is no history of Esophageal cancer, Prostate cancer, or Stomach cancer.   Allergies No Known Allergies   Home Medications  Prior to Admission medications   Medication Sig Start Date End Date Taking? Authorizing Provider  acetaminophen (TYLENOL) 325 MG tablet Take 2 tablets (650 mg total) by mouth every 6 (six) hours as needed for mild pain (or Fever >/= 101). Patient taking differently: Take 650 mg by mouth every 6 (six) hours as needed for mild pain (pain score 1-3) or moderate pain (pain score 4-6) (or Fever >/= 101). 01/13/23  Yes Sheikh, Omair Latif, DO  apixaban (ELIQUIS) 5 MG TABS tablet Take 1 tablet (5 mg total) by mouth 2 (two) times daily. 02/23/23 10/04/23 Yes Nafziger, Kandee Keen, NP  cyanocobalamin (VITAMIN B12) 500 MCG tablet Take 1 tablet by mouth 2 (two) times daily. 07/26/23  Yes [provider]  eszopiclone 3 MG TABS Take 1 tablet (3 mg total) by mouth at  bedtime. Take immediately before bedtime. Patient taking differently: Take 3 mg by mouth at bedtime as needed (sleep). 09/09/23  Yes Nafziger, Kandee Keen, NP  oxybutynin (DITROPAN) 5 MG tablet Take 5 mg by mouth 2 (two) times daily as needed for bladder spasms (urinary incontinence).   Yes [provider]  oxyCODONE (ROXICODONE) 5 MG immediate release tablet Take 1 tablet (5 mg total) by mouth every 6 (six) hours as needed for severe pain (pain score 7-10). 09/09/23  Yes Nafziger, Kandee Keen, NP  tamsulosin (FLOMAX) 0.4 MG CAPS capsule Take 1 capsule by mouth daily. 06/21/23  Yes [provider]  valsartan-hydrochlorothiazide (DIOVAN-HCT) 160-25 MG tablet Take 1 tablet by mouth daily.   Yes [provider]  allopurinol (ZYLOPRIM) 300 MG tablet Take 300 mg by mouth daily. Patient not taking: Reported on 10/04/2023    [provider]  amLODipine (NORVASC) 10 MG tablet Take 10 mg by mouth daily. Patient not taking: Reported on 10/04/2023    [provider]  capecitabine (XELODA) 500 MG tablet Take 3 tablets (1,500 mg total) by mouth 2 (two) times daily after a meal. Take Monday-Friday. Take only days of radiation. Patient not taking: Reported on 10/04/2023 08/25/23   [name], MD  LORazepam (ATIVAN) 0.5 MG tablet Take 1 tablet (0.5 mg total) by mouth every 8 (eight) hours as needed for anxiety. Patient not taking: Reported on 10/04/2023 09/29/23   Rana Snare, NP  predniSONE (DELTASONE) 10 MG tablet Take 1 tablet (10 mg total) by mouth daily with breakfast. Patient not taking: Reported on 10/04/2023 09/14/23   Rana Snare, NP     Critical care time: 55 mins        Posey Boyer, MSN, AG-ACNP-BC [facility] 10/05/2023, 2:34 PM  See Amion for pager If no response to pager , please call [phone] until 7pm After 7:00 pm call Elink  [phone]

## 2023-10-05 NOTE — Plan of Care (Signed)

## 2023-10-05 NOTE — Progress Notes (Signed)
 PT biliary drain not charted on LDA after placement. Total out put in bag recorded, flushed with 5cc d/t pt discomfort, minimal return following reconnection of bag. Will continue to assess.

## 2023-10-05 NOTE — TOC Initial Note (Signed)
 Transition of Care [facility]) - Initial/Assessment Note    Patient Details  Name: Marcus [name] MRN: [id number] Date of Birth: 05-05-1941  Transition of Care [facility]) CM/SW Contact:    Diona Browner, LCSW Phone Number: 10/05/2023, 10:08 AM  Clinical Narrative:                 Pt from home w/ alone. Pt has adult children as support system. Pt continues medical workup. TOC following for d/c needs.    Barriers to Discharge: Continued Medical Work up   Patient Goals and CMS Choice Patient states their goals for this hospitalization and ongoing recovery are:: return home CMS Medicare.gov Compare Post Acute Care list provided to::  (NA) Choice offered to / list presented to : NA [place] ownership interest in [facility].provided to::  (NA)    Expected Discharge Plan and Services In-house Referral: NA     Living arrangements for the past 2 months: Single Family Home                 DME Arranged: N/A DME Agency: NA       HH Arranged: NA HH Agency: NA        Prior Living Arrangements/Services Living arrangements for the past 2 months: Single Family Home Lives with:: Adult Children, Self Patient language and need for interpreter reviewed:: Yes Do you feel safe going back to the place where you live?: Yes      Need for Family Participation in Patient Care: Yes (Comment) Care giver support system in place?: Yes (comment)   Criminal Activity/Legal Involvement Pertinent to Current Situation/Hospitalization: No - Comment as needed  Activities of Daily Living   ADL Screening (condition at time of admission) Independently performs ADLs?: Yes (appropriate for developmental age) Is the patient deaf or have difficulty hearing?: No Does the patient have difficulty seeing, even when wearing glasses/contacts?: No Does the patient have difficulty concentrating, remembering, or making decisions?: No  Permission Sought/Granted                  Emotional  Assessment Appearance:: Appears stated age Attitude/Demeanor/Rapport: Engaged Affect (typically observed): Accepting Orientation: : Oriented to Self, Oriented to Place, Oriented to  Time, Oriented to Situation Alcohol / Substance Use: Not Applicable Psych Involvement: No (comment)  Admission diagnosis:  Acute cholecystitis [K81.0] Cholecystitis [K81.9] Patient Active Problem List   Diagnosis Date Noted   Gastric outlet obstruction 09/02/2023   Malignant neoplasm of head of pancreas (HCC) 09/02/2023   Abnormal CT of the abdomen 09/02/2023   Anemia 02/11/2023   Genetic testing 01/14/2023   AKI (acute kidney injury) (HCC) 01/10/2023   Sepsis secondary from pneumonia and acute cholecystitis  (HCC) 01/09/2023   Pneumonia of left lower lobe due to infectious organism 01/09/2023   CHF exacerbation (HCC) 01/09/2023   Combined systolic and diastolic heart failure (HCC) 01/09/2023   COPD with acute exacerbation (HCC) 01/09/2023   Acute cholecystitis 01/09/2023   Hypokalemia 01/09/2023   History of biliary duct stent placement 01/09/2023   Pruritus 01/09/2023   Pancreatic adenocarcinoma (HCC) 12/29/2022   Malnutrition of moderate degree 12/15/2022   Biliary obstruction 12/10/2022   Pancreatic mass 12/10/2022   Status post bariatric surgery 12/10/2022   Elevated LFTs 12/10/2022   Atrial fibrillation, chronic (HCC) 10/11/2022   Long term (current) use of anticoagulants 10/11/2022   PND (post-nasal drip) 05/03/2022   Bronchiectasis without complication (HCC) 04/03/2022   Vitamin B12 deficiency 12/25/2020   Sensorineural hearing loss, bilateral  Cardiac pacemaker in situ 03/26/2020   AV block 08/01/2018   Degeneration of lumbar intervertebral disc 10/14/2017   Lumbar post-laminectomy syndrome 10/14/2017   Primary osteoarthritis of knee 05/23/2015   S/P knee replacement 05/23/2015   Hemorrhoids 01/19/2011   Vitamin D deficiency 10/28/2009   Obesity 10/17/2007   Gout 01/18/2007    Obstructive sleep apnea 01/18/2007   Benign essential hypertension 01/18/2007   GERD (gastroesophageal reflux disease) 01/18/2007   History of malignant neoplasm of colon 01/18/2007   History of colonic polyps 01/18/2007   Hiatal hernia 10/26/2006   Diverticulosis of colon 07/07/2005   PCP:  [name], NP Pharmacy:   [facility] [address] [place] [address] Phone: [phone] Fax: [phone]  [facility] [address] [place] [address] Phone: [phone] Fax: [phone]     Social Drivers of Health (SDOH) Social History: SDOH Screenings   Food Insecurity: No Food Insecurity (10/03/2023)  Housing: Low Risk  (10/03/2023)  Transportation Needs: No Transportation Needs (10/03/2023)  Utilities: Not At Risk (10/03/2023)  Alcohol Screen: Low Risk  (08/22/2023)  Depression (PHQ2-9): High Risk (09/01/2023)  Financial Resource Strain: Low Risk  (07/28/2021)  Physical Activity: Inactive (07/28/2021)  Social Connections: Moderately Isolated (10/03/2023)  Stress: No Stress Concern Present (07/28/2021)  Tobacco Use: Medium Risk (10/03/2023)   SDOH Interventions:     Readmission Risk Interventions    10/05/2023   10:05 AM 09/04/2023    4:09 PM 01/11/2023    5:08 PM  Readmission Risk Prevention Plan  Transportation Screening Complete Complete Complete  Medication Review Oceanographer) Complete Complete Referral to Pharmacy  PCP or Specialist appointment within 3-5 days of discharge Complete Complete Complete  HRI or Home Care Consult Complete Complete Complete  SW Recovery Care/Counseling Consult Complete Complete Complete  Palliative Care Screening Not Applicable Not Applicable Not Applicable  Skilled Nursing Facility Not Applicable Not Applicable Not Applicable

## 2023-10-06 ENCOUNTER — Ambulatory Visit: Payer: Non-veteran care

## 2023-10-06 ENCOUNTER — Ambulatory Visit

## 2023-10-06 ENCOUNTER — Inpatient Hospital Stay (HOSPITAL_COMMUNITY)

## 2023-10-06 DIAGNOSIS — R6521 Severe sepsis with septic shock: Secondary | ICD-10-CM

## 2023-10-06 DIAGNOSIS — A419 Sepsis, unspecified organism: Secondary | ICD-10-CM

## 2023-10-06 DIAGNOSIS — R578 Other shock: Secondary | ICD-10-CM

## 2023-10-06 DIAGNOSIS — N179 Acute kidney failure, unspecified: Secondary | ICD-10-CM

## 2023-10-06 DIAGNOSIS — K810 Acute cholecystitis: Secondary | ICD-10-CM | POA: Diagnosis not present

## 2023-10-06 DIAGNOSIS — R627 Adult failure to thrive: Secondary | ICD-10-CM

## 2023-10-06 LAB — BASIC METABOLIC PANEL WITH GFR
Anion gap: 6 (ref 5–15)
BUN: 34 mg/dL — ABNORMAL HIGH (ref 8–23)
CO2: 22 mmol/L (ref 22–32)
Calcium: 7.9 mg/dL — ABNORMAL LOW (ref 8.9–10.3)
Chloride: 103 mmol/L (ref 98–111)
Creatinine, Ser: 1.95 mg/dL — ABNORMAL HIGH (ref 0.61–1.24)
GFR, Estimated: 34 mL/min — ABNORMAL LOW (ref >60)
Glucose, Bld: 147 mg/dL — ABNORMAL HIGH (ref 70–99)
Potassium: 3.3 mmol/L — ABNORMAL LOW (ref 3.5–5.1)
Sodium: 131 mmol/L — ABNORMAL LOW (ref 135–145)

## 2023-10-06 LAB — CBC WITH DIFFERENTIAL/PLATELET
Abs Immature Granulocytes: 0.23 10*3/uL — ABNORMAL HIGH (ref 0.00–0.07)
Basophils Absolute: 0 10*3/uL (ref 0.0–0.1)
Basophils Relative: 0 %
Eosinophils Absolute: 0.2 10*3/uL (ref 0.0–0.5)
Eosinophils Relative: 1 %
HCT: 30.3 % — ABNORMAL LOW (ref 39.0–52.0)
Hemoglobin: 9.8 g/dL — ABNORMAL LOW (ref 13.0–17.0)
Immature Granulocytes: 2 %
Lymphocytes Relative: 4 %
Lymphs Abs: 0.6 10*3/uL — ABNORMAL LOW (ref 0.7–4.0)
MCH: 27.8 pg (ref 26.0–34.0)
MCHC: 32.3 g/dL (ref 30.0–36.0)
MCV: 85.8 fL (ref 80.0–100.0)
Monocytes Absolute: 1.3 10*3/uL — ABNORMAL HIGH (ref 0.1–1.0)
Monocytes Relative: 8 %
Neutro Abs: 13.4 10*3/uL — ABNORMAL HIGH (ref 1.7–7.7)
Neutrophils Relative %: 85 %
Platelets: 173 10*3/uL (ref 150–400)
RBC: 3.53 MIL/uL — ABNORMAL LOW (ref 4.22–5.81)
RDW: 18.8 % — ABNORMAL HIGH (ref 11.5–15.5)
Smear Review: NORMAL
WBC: 15.8 10*3/uL — ABNORMAL HIGH (ref 4.0–10.5)
nRBC: 0 % (ref 0.0–0.2)

## 2023-10-06 LAB — ECHOCARDIOGRAM COMPLETE
AR max vel: 2.2 cm2
AV Area VTI: 2.12 cm2
AV Area mean vel: 2.29 cm2
AV Mean grad: 8.7 mmHg
AV Peak grad: 15.5 mmHg
Ao pk vel: 1.97 m/s
Area-P 1/2: 1.6 cm2
Calc EF: 35.5 %
Height: 68 in
MV VTI: 2.58 cm2
S' Lateral: 2.6 cm
Single Plane A2C EF: 47.1 %
Single Plane A4C EF: 26.2 %
Weight: 2960 [oz_av]

## 2023-10-06 LAB — HEPATIC FUNCTION PANEL
ALT: 14 U/L (ref 0–44)
AST: 20 U/L (ref 15–41)
Albumin: 2.4 g/dL — ABNORMAL LOW (ref 3.5–5.0)
Alkaline Phosphatase: 51 U/L (ref 38–126)
Bilirubin, Direct: 0.1 mg/dL (ref 0.0–0.2)
Indirect Bilirubin: 0.3 mg/dL (ref 0.3–0.9)
Total Bilirubin: 0.4 mg/dL (ref 0.0–1.2)
Total Protein: 5.6 g/dL — ABNORMAL LOW (ref 6.5–8.1)

## 2023-10-06 LAB — CREATININE, URINE, RANDOM: Creatinine, Urine: 131 mg/dL

## 2023-10-06 LAB — SODIUM, URINE, RANDOM: Sodium, Ur: <10 mmol/L

## 2023-10-06 LAB — MAGNESIUM: Magnesium: 2 mg/dL (ref 1.7–2.4)

## 2023-10-06 MED ORDER — ENSURE ENLIVE PO LIQD
237.0000 mL | Freq: Two times a day (BID) | ORAL | Status: DC
  Administered 2023-10-06 – 2023-10-09 (×5): 237 mL via ORAL

## 2023-10-06 MED ORDER — POLYETHYLENE GLYCOL 3350 17 G PO PACK
17.0000 g | PACK | Freq: Every day | ORAL | Status: DC
  Administered 2023-10-06 – 2023-10-10 (×3): 17 g via ORAL
  Filled 2023-10-06 (×5): qty 1

## 2023-10-06 MED ORDER — PERFLUTREN LIPID MICROSPHERE
1.0000 mL | INTRAVENOUS | Status: AC | PRN
  Administered 2023-10-06: 2 mL via INTRAVENOUS

## 2023-10-06 MED ORDER — MIDODRINE HCL 5 MG PO TABS
10.0000 mg | ORAL_TABLET | Freq: Three times a day (TID) | ORAL | Status: DC
Start: 2023-10-06 — End: 2023-10-07
  Administered 2023-10-06 (×2): 10 mg via ORAL
  Filled 2023-10-06 (×2): qty 2

## 2023-10-06 MED ORDER — POTASSIUM CHLORIDE 20 MEQ PO PACK
40.0000 meq | PACK | Freq: Once | ORAL | Status: AC
  Administered 2023-10-06: 40 meq via ORAL
  Filled 2023-10-06: qty 2

## 2023-10-06 MED ORDER — SENNA 8.6 MG PO TABS
1.0000 | ORAL_TABLET | Freq: Two times a day (BID) | ORAL | Status: DC
  Administered 2023-10-06: 8.6 mg via ORAL
  Filled 2023-10-06 (×2): qty 1

## 2023-10-06 MED ORDER — POTASSIUM CHLORIDE CRYS ER 20 MEQ PO TBCR: 40.0000 meq | EXTENDED_RELEASE_TABLET | Freq: Once | ORAL | Status: DC

## 2023-10-06 NOTE — Progress Notes (Signed)
 Pharmacy Antibiotic Note  Marcus [name] is a 83 y.o. male admitted on 10/03/2023 with  intra-abdominal infection .  PMH significant for pancreatic cancer, HIDA scan w/ cystic duct obstruction s/p perc chole drain placement on 4/1.  Pharmacy has been consulted for piperacillin/tazobactam dosing.  Plan: Continue Zosyn 3.375g IV Q8H infused over 4hrs.  Monitor renal function, SCr increasing, but CrCl remains > 20 ml/min Follow up culture results, and clinical course.    Height: 5\' 8"  (172.7 cm) Weight: 83.9 kg (185 lb) IBW/kg (Calculated) : 68.4  Temp (24hrs), Avg:97.7 F (36.5 C), Min:97.5 F (36.4 C), Max:98 F (36.7 C)  Recent Labs  Lab 10/03/23 0944 10/03/23 1146 10/03/23 1448 10/04/23 0350 10/05/23 0024 10/05/23 1058 10/05/23 1423 10/05/23 1617 10/06/23 0814  WBC 2.9*  --   --  2.5* 6.7  --   --   --  15.8*  CREATININE 1.06  --   --  1.17 1.45*  --   --   --  1.95*  LATICACIDVEN  --  0.6 0.7  --   --  2.7* 2.3* 2.3*  --     Estimated Creatinine Clearance: 30.3 mL/min (A) (by C-G formula based on SCr of 1.95 mg/dL (H)).    No Known Allergies  Antimicrobials this admission: 3/31 Piperacillin/tazobactam  >>    Micro: 4/1 MRSA: not detected 4/2 Bcx: ngtd 4/1 Bile: mod GNR, moderate C.freundii.  Holding for anaerobe.    Lynann Beaver PharmD, BCPS [facility] main pharmacy [phone] 10/06/2023 2:18 PM

## 2023-10-06 NOTE — TOC Progression Note (Signed)
 Transition of Care [facility]) - Progression Note    Patient Details  Name: Marcus [name] MRN: [id number] Date of Birth: 1941-06-05  [facility] CM/SW Contact  Adrian Prows, RN Phone Number: 10/06/2023, 1:20 PM  Clinical Narrative:    Pt remains on vasopressors; TOC is following.     Barriers to Discharge: Continued Medical Work up  Expected Discharge Plan and Services In-house Referral: NA     Living arrangements for the past 2 months: Single Family Home                 DME Arranged: N/A DME Agency: NA       HH Arranged: NA HH Agency: NA         Social Determinants of Health (SDOH) Interventions SDOH Screenings   Food Insecurity: No Food Insecurity (10/03/2023)  Housing: Low Risk  (10/03/2023)  Transportation Needs: No Transportation Needs (10/03/2023)  Utilities: Not At Risk (10/03/2023)  Alcohol Screen: Low Risk  (08/22/2023)  Depression (PHQ2-9): High Risk (09/01/2023)  Financial Resource Strain: Low Risk  (07/28/2021)  Physical Activity: Inactive (07/28/2021)  Social Connections: Moderately Isolated (10/03/2023)  Stress: No Stress Concern Present (07/28/2021)  Tobacco Use: Medium Risk (10/03/2023)    Readmission Risk Interventions    10/05/2023   10:05 AM 09/04/2023    4:09 PM 01/11/2023    5:08 PM  Readmission Risk Prevention Plan  Transportation Screening Complete Complete Complete  Medication Review (RN Care Manager) Complete Complete Referral to Pharmacy  PCP or Specialist appointment within 3-5 days of discharge Complete Complete Complete  HRI or Home Care Consult Complete Complete Complete  SW Recovery Care/Counseling Consult Complete Complete Complete  Palliative Care Screening Not Applicable Not Applicable Not Applicable  Skilled Nursing Facility Not Applicable Not Applicable Not Applicable

## 2023-10-06 NOTE — Progress Notes (Signed)
 Referring Physician(s): Girguis,D  Supervising Physician: Roanna Banning  Patient Status:  [name] - In-pt  Chief Complaint: Hx pancreatic cancer with CBD stenting. Hx chronic cholecystitis, now admitted for recurrent abdominal pain ; s/p replacement of GB drain  4/1   Subjective: Pt resting quietly in bed; son in room; no acute c/o; has some mild abd discomfort  Past Medical History:  Diagnosis Date   Acute meniscal tear of knee left   Arthritis    generalized   AV block 08/01/2018   Benign essential hypertension 01/18/2007   Bronchiectasis with acute exacerbation 06/23/2020   Chest pain, atypical 08/23/2014   Chronic cough 10/07/2014   Coronary artery disease    Degeneration of lumbar intervertebral disc 10/14/2017   Diverticulosis of colon 07/07/2005   Elevated PSA 06/19/2014   GERD (gastroesophageal reflux disease) 01/18/2007   Gout, unspecified 01/18/2007   Hemorrhoids 01/19/2011   Hiatal hernia    History of colonic polyps 01/18/2007   Insomnia, unspecified 08/21/2007   Iron deficiency anemia, unspecified  01/19/2011   Lumbar post-laminectomy syndrome 10/14/2017   Lumbar spondylolysis    Lumbar strain 12/14/2011   Obstructive sleep apnea 01/18/2007   uses CPAP nightly   Paraesophageal hernia    large   Primary osteoarthritis of knee 05/23/2015   Prostate cancer (HCC) 2016   S/P knee replacement 05/23/2015   Sensorineural hearing loss, bilateral    Vitamin B12 deficiency    Vitamin D deficiency 10/28/2009   Past Surgical History:  Procedure Laterality Date   BILIARY BRUSHING  12/13/2022   Procedure: BILIARY BRUSHING;  Surgeon: Iva Boop, MD;  Location: [facility] ENDOSCOPY;  Service: Gastroenterology;;   BILIARY STENT PLACEMENT  12/13/2022   Procedure: BILIARY STENT PLACEMENT;  Surgeon: Iva Boop, MD;  Location: [name] ENDOSCOPY;  Service: Gastroenterology;;   BILIARY STENT PLACEMENT N/A 02/07/2023   Procedure: BILIARY STENT PLACEMENT;  Surgeon: Lemar Lofty., MD;  Location: [facility] ENDOSCOPY;  Service: Gastroenterology;  Laterality: N/A;   BIOPSY  12/23/2022   Procedure: BIOPSY;  Surgeon: Meridee Score Netty Starring., MD;  Location: [facility] ENDOSCOPY;  Service: Gastroenterology;;   BIOPSY  09/05/2023   Procedure: BIOPSY;  Surgeon: Lemar Lofty., MD;  Location: [facility] ENDOSCOPY;  Service: Gastroenterology;;   BRONCHIAL WASHINGS  10/30/2019   Procedure: BRONCHIAL WASHINGS;  Surgeon: [name], DO;  Location: [facility] ENDOSCOPY;  Service: Endoscopy;;   CARDIAC CATHETERIZATION     CATARACT EXTRACTION W/ INTRAOCULAR LENS  IMPLANT, BILATERAL Bilateral    COLONOSCOPY     DUODENAL STENT PLACEMENT N/A 09/05/2023   Procedure: INSERTION, STENT, DUODENUM;  Surgeon: Lemar Lofty., MD;  Location: [facility] ENDOSCOPY;  Service: Gastroenterology;  Laterality: N/A;   ENDOSCOPIC RETROGRADE CHOLANGIOPANCREATOGRAPHY (ERCP) WITH PROPOFOL N/A 02/07/2023   Procedure: ENDOSCOPIC RETROGRADE CHOLANGIOPANCREATOGRAPHY (ERCP) WITH PROPOFOL;  Surgeon: Meridee Score Netty Starring., MD;  Location: [facility] ENDOSCOPY;  Service: Gastroenterology;  Laterality: N/A;  Stent change   ERCP N/A 12/13/2022   Procedure: ENDOSCOPIC RETROGRADE CHOLANGIOPANCREATOGRAPHY (ERCP);  Surgeon: Iva Boop, MD;  Location: [name] ENDOSCOPY;  Service: Gastroenterology;  Laterality: N/A;  with stent placement   ESOPHAGOGASTRODUODENOSCOPY     ESOPHAGOGASTRODUODENOSCOPY (EGD) WITH PROPOFOL N/A 12/23/2022   Procedure: ESOPHAGOGASTRODUODENOSCOPY (EGD) WITH PROPOFOL;  Surgeon: Meridee Score Netty Starring., MD;  Location: [facility] ENDOSCOPY;  Service: Gastroenterology;  Laterality: N/A;   ESOPHAGOGASTRODUODENOSCOPY (EGD) WITH PROPOFOL N/A 09/05/2023   Procedure: ESOPHAGOGASTRODUODENOSCOPY (EGD) WITH PROPOFOL;  Surgeon: Meridee Score Netty Starring., MD;  Location: [facility] ENDOSCOPY;  Service: Gastroenterology;  Laterality: N/A;  EUS N/A 12/23/2022   Procedure: UPPER ENDOSCOPIC ULTRASOUND (EUS) RADIAL;  Surgeon: Lemar Lofty., MD;   Location: [facility] ENDOSCOPY;  Service: Gastroenterology;  Laterality: N/A;   FINE NEEDLE ASPIRATION  12/23/2022   Procedure: FINE NEEDLE ASPIRATION (FNA) RADIAL;  Surgeon: Lemar Lofty., MD;  Location: [facility] ENDOSCOPY;  Service: Gastroenterology;;   IR IMAGING GUIDED PORT INSERTION  01/04/2023   IR PERC CHOLECYSTOSTOMY  09/03/2023   IR PERC CHOLECYSTOSTOMY  10/04/2023   IR SINUS/FIST TUBE CHK-NON GI  09/22/2023   KNEE ARTHROSCOPY Right 2007   KNEE ARTHROSCOPY  07/13/2011   Procedure: ARTHROSCOPY KNEE;  Surgeon: Erasmo Leventhal;  Location: [facility];  Service: Orthopedics;  Laterality: Left;  partial menisectomy with chondrylplasty   KNEE SURGERY Left    LAMINECTOMY AND MICRODISCECTOMY LUMBAR SPINE  MARCH  2008   L3 -  4   LAPAROSCOPIC INCISIONAL / UMBILICAL / VENTRAL HERNIA REPAIR  2006   PACEMAKER IMPLANT N/A 08/02/2018   Procedure: PACEMAKER IMPLANT;  Surgeon: Marinus Maw, MD;  Location: [facility];  Service: Cardiovascular;  Laterality: N/A;   PROSTATE BIOPSY     RADIOACTIVE SEED IMPLANT N/A 02/12/2019   Procedure: RADIOACTIVE SEED IMPLANT/BRACHYTHERAPY IMPLANT;  Surgeon: Marcine Matar, MD;  Location: [facility] ORS;  Service: Urology;  Laterality: N/A;  90 MINS   REMOVAL OF STONES  02/07/2023   Procedure: REMOVAL OF Sludge;  Surgeon: Meridee Score Netty Starring., MD;  Location: [facility] ENDOSCOPY;  Service: Gastroenterology;;   SHOULDER ARTHROSCOPY Right 05-23-2007   SIGMOID COLECTOMY FOR CANCER  1989   SPACE OAR INSTILLATION N/A 02/12/2019   Procedure: SPACE OAR INSTILLATION;  Surgeon: Marcine Matar, MD;  Location: [facility] ORS;  Service: Urology;  Laterality: N/A;   SPHINCTEROTOMY  12/13/2022   Procedure: SPHINCTEROTOMY;  Surgeon: Iva Boop, MD;  Location: [name] ENDOSCOPY;  Service: Gastroenterology;;   Francine Graven REMOVAL  02/07/2023   Procedure: STENT REMOVAL;  Surgeon: Lemar Lofty., MD;  Location: [facility] ENDOSCOPY;  Service: Gastroenterology;;   TOTAL KNEE ARTHROPLASTY  Left 05/23/2015   Procedure: LEFT TOTAL KNEE ARTHROPLASTY;  Surgeon: Eugenia Mcalpine, MD;  Location: [facility] ORS;  Service: Orthopedics;  Laterality: Left;   VERTICAL BANDED GASTROPLASTY  1986   VIDEO BRONCHOSCOPY N/A 10/30/2019   Procedure: VIDEO BRONCHOSCOPY WITH BRONICAL ALVEROLAR LAVAGE WITHOUT FLUORO;  Surgeon: [name], DO;  Location: [facility] ENDOSCOPY;  Service: Endoscopy;  Laterality: N/A;      Allergies: Patient has no known allergies.  Medications: Prior to Admission medications   Medication Sig Start Date End Date Taking? Authorizing Provider  acetaminophen (TYLENOL) 325 MG tablet Take 2 tablets (650 mg total) by mouth every 6 (six) hours as needed for mild pain (or Fever >/= 101). Patient taking differently: Take 650 mg by mouth every 6 (six) hours as needed for mild pain (pain score 1-3) or moderate pain (pain score 4-6) (or Fever >/= 101). 01/13/23  Yes Sheikh, Omair Latif, DO  apixaban (ELIQUIS) 5 MG TABS tablet Take 1 tablet (5 mg total) by mouth 2 (two) times daily. 02/23/23 10/04/23 Yes Nafziger, Kandee Keen, NP  cyanocobalamin (VITAMIN B12) 500 MCG tablet Take 1 tablet by mouth 2 (two) times daily. 07/26/23  Yes [provider]  eszopiclone 3 MG TABS Take 1 tablet (3 mg total) by mouth at bedtime. Take immediately before bedtime. Patient taking differently: Take 3 mg by mouth at bedtime as needed (sleep). 09/09/23  Yes Nafziger, Kandee Keen, NP  oxybutynin (DITROPAN) 5 MG tablet Take 5 mg by  mouth 2 (two) times daily as needed for bladder spasms (urinary incontinence).   Yes [provider]  oxyCODONE (ROXICODONE) 5 MG immediate release tablet Take 1 tablet (5 mg total) by mouth every 6 (six) hours as needed for severe pain (pain score 7-10). 09/09/23  Yes Nafziger, Kandee Keen, NP  tamsulosin (FLOMAX) 0.4 MG CAPS capsule Take 1 capsule by mouth daily. 06/21/23  Yes [provider]  valsartan-hydrochlorothiazide (DIOVAN-HCT) 160-25 MG tablet Take 1 tablet by mouth daily.   Yes  [provider]  allopurinol (ZYLOPRIM) 300 MG tablet Take 300 mg by mouth daily. Patient not taking: Reported on 10/04/2023    [provider]  amLODipine (NORVASC) 10 MG tablet Take 10 mg by mouth daily. Patient not taking: Reported on 10/04/2023    [provider]  capecitabine (XELODA) 500 MG tablet Take 3 tablets (1,500 mg total) by mouth 2 (two) times daily after a meal. Take Monday-Friday. Take only days of radiation. Patient not taking: Reported on 10/04/2023 08/25/23   [name], MD  LORazepam (ATIVAN) 0.5 MG tablet Take 1 tablet (0.5 mg total) by mouth every 8 (eight) hours as needed for anxiety. Patient not taking: Reported on 10/04/2023 09/29/23   Rana Snare, NP  predniSONE (DELTASONE) 10 MG tablet Take 1 tablet (10 mg total) by mouth daily with breakfast. Patient not taking: Reported on 10/04/2023 09/14/23   Rana Snare, NP     Vital Signs: BP (!) 108/50   Pulse 60   Temp (!) 97.5 F (36.4 C) (Oral)   Resp (!) 8   Ht 5\' 8"  (1.727 m)   Wt 185 lb (83.9 kg)   SpO2 96%   BMI 28.13 kg/m   Physical Exam: awake/alert; GB drain intact, insertion site ok, not sig tender, OP 25 cc amber bile with some stone debris in bag  Imaging: ECHOCARDIOGRAM COMPLETE Result Date: 10/06/2023    ECHOCARDIOGRAM REPORT   Patient Name:   [name] Date of Exam: 10/06/2023 Medical Rec #:  [id number]       Height:       68.0 in Accession #:    [id number]      Weight:       185.0 lb Date of Birth:  1940/11/24       BSA:          1.977 m Patient Age:    82 years        BP:           124/45 mmHg Patient Gender: M               HR:           63 bpm. Exam Location:  Inpatient Procedure: 2D Echo, Color Doppler, Cardiac Doppler and Intracardiac            Opacification Agent (Both Spectral and Color Flow Doppler were            utilized during procedure). Indications:    Shock  History:        Patient has prior history of Echocardiogram examinations, most                 recent  12/14/2022. CHF, COPD, Arrythmias:AV Block; Risk                 Factors:Sleep Apnea, Former Smoker and Hypertension.  Sonographer:    Lamont Snowball Referring Phys: [phone] A CALDWELL POWELL JR  Sonographer Comments: Technically difficult study due  to poor echo windows. IMPRESSIONS  1. Left ventricular ejection fraction, by estimation, is 35 to 40%. Left ventricular ejection fraction by 2D MOD biplane is 35.5 %. The left ventricle has moderately decreased function. The left ventricle demonstrates global hypokinesis. Left ventricular diastolic parameters are consistent with Grade I diastolic dysfunction (impaired relaxation).  2. Right ventricular systolic function is normal. The right ventricular size is normal.  3. Left atrial size was moderately dilated.  4. Right atrial size was moderately dilated.  5. The mitral valve is abnormal. Trivial mitral valve regurgitation.  6. The aortic valve is tricuspid. Aortic valve regurgitation is not visualized. Aortic valve sclerosis/calcification is present, without any evidence of aortic stenosis. Aortic valve mean gradient measures 8.7 mmHg.  7. Aortic dilatation noted. There is mild dilatation of the ascending aorta, measuring 40 mm. Comparison(s): Changes from prior study are noted. 12/14/2022: LVEF 45-50%. FINDINGS  Left Ventricle: No apical thrombus with Definity contrast. Left ventricular ejection fraction, by estimation, is 35 to 40%. Left ventricular ejection fraction by 2D MOD biplane is 35.5 %. The left ventricle has moderately decreased function. The left ventricle demonstrates global hypokinesis. Definity contrast agent was given IV to delineate the left ventricular endocardial borders. The left ventricular internal cavity size was normal in size. There is no left ventricular hypertrophy. Abnormal (paradoxical) septal motion, consistent with RV pacemaker. Left ventricular diastolic parameters are consistent with Grade I diastolic dysfunction (impaired relaxation).  Indeterminate filling pressures. Right Ventricle: The right ventricular size is normal. No increase in right ventricular wall thickness. Right ventricular systolic function is normal. Left Atrium: Left atrial size was moderately dilated. Right Atrium: Right atrial size was moderately dilated. Pericardium: There is no evidence of pericardial effusion. Mitral Valve: The mitral valve is abnormal. There is mild calcification of the anterior and posterior mitral valve leaflet(s). Trivial mitral valve regurgitation. MV peak gradient, 5.2 mmHg. The mean mitral valve gradient is 2.0 mmHg. Tricuspid Valve: The tricuspid valve is grossly normal. Tricuspid valve regurgitation is trivial. Aortic Valve: The aortic valve is tricuspid. Aortic valve regurgitation is not visualized. Aortic valve sclerosis/calcification is present, without any evidence of aortic stenosis. Aortic valve mean gradient measures 8.7 mmHg. Aortic valve peak gradient measures 15.5 mmHg. Aortic valve area, by VTI measures 2.12 cm. Pulmonic Valve: The pulmonic valve was normal in structure. Pulmonic valve regurgitation is not visualized. Aorta: Aortic dilatation noted. There is mild dilatation of the ascending aorta, measuring 40 mm. Venous: The inferior vena cava was not well visualized. IAS/Shunts: No atrial level shunt detected by color flow Doppler. Additional Comments: A device lead is visualized.  LEFT VENTRICLE PLAX 2D                        Biplane EF (MOD) LVIDd:         4.60 cm         LV Biplane EF:   Left LVIDs:         2.60 cm                          ventricular LV PW:         0.80 cm                          ejection LV IVS:        0.90 cm  fraction by LVOT diam:     2.20 cm                          2D MOD LV SV:         81                               biplane is LV SV Index:   41                               35.5 %. LVOT Area:     3.80 cm                                Diastology                                LV e'  medial:    9.79 cm/s LV Volumes (MOD)               LV E/e' medial:  5.4 LV vol d, MOD    129.0 ml      LV e' lateral:   9.14 cm/s A2C:                           LV E/e' lateral: 5.8 LV vol d, MOD    131.0 ml A4C: LV vol s, MOD    68.3 ml A2C: LV vol s, MOD    96.7 ml A4C: LV SV MOD A2C:   60.7 ml LV SV MOD A4C:   131.0 ml LV SV MOD BP:    45.9 ml RIGHT VENTRICLE RV Basal diam:  4.00 cm RV S prime:     13.70 cm/s TAPSE (M-mode): 2.3 cm LEFT ATRIUM             Index        RIGHT ATRIUM           Index LA Vol (A2C):   99.7 ml 50.43 ml/m  RA Area:     25.70 cm LA Vol (A4C):   75.4 ml 38.14 ml/m  RA Volume:   88.80 ml  44.91 ml/m LA Biplane Vol: 89.1 ml 45.07 ml/m  AORTIC VALVE AV Area (Vmax):    2.20 cm AV Area (Vmean):   2.29 cm AV Area (VTI):     2.12 cm AV Vmax:           197.11 cm/s AV Vmean:          139.577 cm/s AV VTI:            0.383 m AV Peak Grad:      15.5 mmHg AV Mean Grad:      8.7 mmHg LVOT Vmax:         114.00 cm/s LVOT Vmean:        84.100 cm/s LVOT VTI:          0.214 m LVOT/AV VTI ratio: 0.56  AORTA Ao Root diam: 3.50 cm Ao Asc diam:  4.00 cm MITRAL VALVE MV Area (PHT): 1.60 cm     SHUNTS MV Area VTI:   2.58 cm     Systemic VTI:  0.21 m MV Peak grad:  5.2 mmHg     Systemic Diam: 2.20 cm MV Mean grad:  2.0 mmHg MV Vmax:       1.14 m/s MV Vmean:      59.9 cm/s MV Decel Time: 474 msec MV E velocity: 53.10 cm/s MV A velocity: 101.00 cm/s MV E/A ratio:  0.53 Zoila Shutter MD Electronically signed by Zoila Shutter MD Signature Date/Time: 10/06/2023/12:15:34 PM    Final    CT ABDOMEN PELVIS W CONTRAST Result Date: 10/05/2023 CLINICAL DATA:  Abdominal pain, acute, nonlocalized post cholecystostomy catheter placement procedure pain EXAM: CT ABDOMEN AND PELVIS WITH CONTRAST TECHNIQUE: Multidetector CT imaging of the abdomen and pelvis was performed using the standard protocol following bolus administration of intravenous contrast. RADIATION DOSE REDUCTION: This exam was performed according to the  departmental dose-optimization program which includes automated exposure control, adjustment of the mA and/or kV according to patient size and/or use of iterative reconstruction technique. CONTRAST:  75mL OMNIPAQUE IOHEXOL 300 MG/ML  SOLN COMPARISON:  10/03/2023 FINDINGS: Lower chest: Small pleural effusions right greater than left have developed. No pneumothorax. Patchy consolidation/atelectasis with air bronchograms posteriorly in the lung bases, new since previous. Hepatobiliary: Interval transhepatic pigtail cholecystostomy catheter placement, well position, with decompression of the gallbladder. Persistent gallbladder wall thickening in some regional inflammatory/edematous change. Metallic stent in the CBD stable, with pneumobilia implying patency of the stent. Stable scattered hepatic cysts, largest 1.6 cm in the posterior right lobe. Pancreas: Marked pancreatic ductal dilatation and parenchymal atrophy as before, with poor delineation of the pancreatic head mass, nearly encircling the SMV and abutting the SMA. No new peripancreatic fluid collections. Spleen: Normal in size without focal abnormality. Adrenals/Urinary Tract: No adrenal lesion. 6 mm linear calcification in the right renal hilum probably atherosclerotic. No definite urolithiasis. No hydronephrosis or worrisome renal lesion. The urinary bladder is nondistended. Stomach/Bowel: Post gastroplasty. Patent stent to the second portion of the duodenum. Distal duodenum is physiologically distended. Small bowel is nondilated, with incomplete distal passage of the oral contrast material. Normal appendix. The colon is partially distended, without acute finding. Anastomotic staple line in the distal sigmoid segment. Vascular/Lymphatic: Moderate scattered aortoiliac calcified plaque without aneurysm. Narrowing of the distal superior mesenteric vein and proximal main portal vein; intrahepatic portal venous branches patent. No abdominal or pelvic adenopathy  localized. Reproductive: Multiple metallic seeds in the prostate. Other: Small volume pelvic ascites, new since previous. No free air. Musculoskeletal: Post ventral hernia repair. Vertebral endplate spurring at multiple levels in the lower thoracic spine. Multilevel lumbar spondylitic change with fusion L3-L5. Chronic appearing sacral fracture deformity. IMPRESSION: 1. Interval cholecystostomy catheter placement, with decompression of the gallbladder, no hematoma or other apparent complication. 2. New small volume pelvic ascites. 3. New small pleural effusions right greater than left, with patchy consolidation/atelectasis posteriorly in the lung bases. 4. Stable pancreatic head mass with pancreatic ductal dilatation, parenchymal atrophy, encasement of the portosplenic confluence. Electronically Signed   By: Corlis Leak M.D.   On: 10/05/2023 15:01   DG CHEST PORT 1 VIEW Result Date: 10/04/2023 CLINICAL DATA:  Hypoxia EXAM: PORTABLE CHEST 1 VIEW COMPARISON:  09/01/2023 FINDINGS: Left pacer remains in place, unchanged. Heart is normal size. Aortic atherosclerosis. Bilateral lower lobe airspace opacities, slightly increased since prior study. No effusions or acute bony abnormality. IMPRESSION: Bilateral lower lobe atelectasis or infiltrates. Electronically Signed   By: Charlett Nose M.D.   On: 10/04/2023 18:27   IR Perc Cholecystostomy Result Date: 10/04/2023 INDICATION: Acute cholecystitis. Prior cholecystostomy drain placed 09/03/2023, dislodged 09/22/2023.  EXAM: ULTRASOUND AND FLUOROSCOPIC-GUIDED CHOLECYSTOSTOMY TUBE PLACEMENT COMPARISON:  US Abdomen, 10/03/2023.  NM HIDA, 10/04/2023. MEDICATIONS: The patient is currently admitted to the [name] and on intravenous antibiotics. Antibiotics were administered within an appropriate time frame prior to skin puncture. ANESTHESIA/SEDATION: Moderate (conscious) sedation was employed during this procedure. A total of Versed 2 mg and Fentanyl 100 mcg was administered  intravenously. Moderate Sedation Time: 21 minutes. The patient's level of consciousness and vital signs were monitored continuously by radiology nursing throughout the procedure under my direct supervision. CONTRAST:  20mL OMNIPAQUE IOHEXOL 300 MG/ML SOLN - administered into the gallbladder fossa. FLUOROSCOPY TIME:  Fluoroscopic dose; 6 mGy COMPLICATIONS: None immediate. PROCEDURE: Informed written consent was obtained from the patient after a discussion of the risks, benefits and alternatives to treatment. Questions regarding the procedure were encouraged and answered. A timeout was performed prior to the initiation of the procedure. The right upper abdominal quadrant was prepped and draped in the usual sterile fashion, and a sterile drape was applied covering the operative field. Maximum barrier sterile technique with sterile gowns and gloves were used for the procedure. A timeout was performed prior to the initiation of the procedure. Local anesthesia was provided with 1% lidocaine with epinephrine. Ultrasound scanning of the RIGHT upper quadrant demonstrates a distended gallbladder. Utilizing a transhepatic approach, a 22 gauge needle was advanced into the gallbladder under direct ultrasound guidance. An ultrasound image was saved for documentation purposes. Appropriate intraluminal puncture was confirmed with the efflux of bile and advancement of an 0.018 wire into the gallbladder lumen. The needle was exchanged for an Accustick set. A small amount of contrast was injected to confirm appropriate intraluminal positioning. Over a Benson wire, a 10 Fr cholecystomy tube was advanced into the gallbladder fossa, coiled and locked. Bile was aspirated and a small amount of contrast was injected as several post procedural spot radiographic images were obtained in various obliquities. The catheter was secured to the skin with suture, connected to a drainage bag and a dressing was placed. The patient tolerated the  procedure well without immediate post procedural complication. IMPRESSION: Successful ultrasound and fluoroscopic guided placement of a 10 Fr cholecystostomy tube. RECOMMENDATIONS: The patient will return to [facility] ([facility]) for routine drainage catheter evaluation and exchange in 6-8 weeks. Roanna Banning, MD [name] Electronically Signed   By: Roanna Banning M.D.   On: 10/04/2023 16:19   NM Hepatobiliary Liver Func Result Date: 10/04/2023 CLINICAL DATA:  Cholelithiasis and cholecystitis. Recent percutaneous cholecystostomy, which no longer in place. Internal biliary stent. Pancreatic carcinoma. EXAM: NUCLEAR MEDICINE HEPATOBILIARY IMAGING TECHNIQUE: Sequential images of the abdomen were obtained out to 60 minutes following intravenous administration of radiopharmaceutical. Because no gallbladder activity was visualized at this time, 3 mg morphine was administered intravenously, and imaging was continued for another 30 minutes RADIOPHARMACEUTICALS:  5.3 mCi Tc-19m  Choletec IV COMPARISON:  None Available. FINDINGS: Prompt uptake and biliary excretion of activity by the liver is seen. Biliary activity promptly passes into small bowel, consistent with patent common bile duct. No gallbladder activity is visualized, either before or following administration of intravenous morphine, consistent with cystic duct obstruction. Reflux of biliary activity into the stomach is also noted. IMPRESSION: Absence of gallbladder activity, consistent with cystic duct obstruction. No evidence of biliary obstruction. Bile reflux noted. Electronically Signed   By: Danae Orleans M.D.   On: 10/04/2023 10:33   US Abdomen Limited RUQ (LIVER/GB) Result Date: 10/03/2023 CLINICAL DATA:  [id number] Thickening of wall of gallbladder [id number] Cholelithiasis [id number] EXAM: ULTRASOUND ABDOMEN LIMITED RIGHT UPPER QUADRANT COMPARISON:  US Abdomen, 01/11/2023. CT of  pelvis, 10/03/2023 and 09/01/2023. FINDINGS: Suboptimal evaluation, with poor acoustic penetration secondary to patient habitus. Gallbladder: Dependent echogenic gallstone, within a nondistended gallbladder, measuring up to 2.3 cm. Gallbladder wall thickening, measuring 0.6 cm with mild pericholecystic fluid. No sonographic Murphy sign noted by sonographer. Common bile duct: Diameter: Poorly visualized. Liver: Increased hepatic parenchymal echogenicity. Ovoid, hypoechoic RIGHT liver lesion measuring 2.2 x 1.4 x 1.5 cm without internal vascularity likely consistent with a cysts. Portal vein is patent on color Doppler imaging with normal direction of blood flow towards the liver. Other: No perihepatic fluid. IMPRESSION: 1. Nondistended gallbladder with gallstone, relative wall thickening and pericholecystic fluid. Examination is equivocal for calculus cholecystitis, consider NM HIDA for further evaluation if continued clinical concern. 2. Echogenic liver. Findings most commonly seen in hepatic steatosis, though may also represent hepatitis and/or fibrosis. Electronically Signed   By: Roanna Banning M.D.   On: 10/03/2023 14:54   CT ABDOMEN PELVIS W CONTRAST Result Date: 10/03/2023 CLINICAL DATA:  Abdominal pain, acute, nonlocalized pancreatic cancer, hx of bilary stent, pain worse in ruq Right lower quadrant abdominal pain for 3 days with nausea. Recently completed radiation therapy. EXAM: CT ABDOMEN AND PELVIS WITH CONTRAST TECHNIQUE: Multidetector CT imaging of the abdomen and pelvis was performed using the standard protocol following bolus administration of intravenous contrast. RADIATION DOSE REDUCTION: This exam was performed according to the departmental dose-optimization program which includes automated exposure control, adjustment of the mA and/or kV according to patient size and/or use of iterative reconstruction technique. CONTRAST:  OMNIPAQUE IOHEXOL 300 MG/ML  SOLN COMPARISON:  Abdominopelvic CT  09/01/2023 and 08/14/2023. Abdominal radiographs 09/06/2023. FINDINGS: Lower chest: Chronic elevation of the left hemidiaphragm with associated chronic bibasilar atelectasis. The overall aeration of the left lung base has improved from the prior study. No significant pleural or pericardial effusion. Patient has a pacemaker. There is aortic and coronary artery atherosclerosis. Hepatobiliary: Grossly stable hepatic cysts. No suspicious liver lesions are identified. Metallic biliary stent is unchanged in position with persistent pneumobilia. Small gallstones, gallbladder wall thickening and pericholecystic inflammation, similar to previous CT. Since the prior CT, the patient had a cholecystostomy tube placed and removed. Pancreas: Grossly stable ill-defined 2.2 cm mass involving the pancreatic head on image 29/4. Persistent moderate pancreatic ductal dilatation. No acute peripancreatic fluid collections are identified. Spleen: Normal in size without focal abnormality. Adrenals/Urinary Tract: Both adrenal glands appear normal. No evidence of urinary tract calculus, suspicious renal lesion or hydronephrosis. Unchanged renal cysts for which no specific follow-up imaging is recommended. The bladder appears unremarkable for its degree of distention. Stomach/Bowel: No enteric contrast was administered. A duodenal stent is in place (placed 09/05/2023) and extending from the distal stomach into the 2nd portion of the duodenum, grossly unchanged from reference prior radiographs. There is fluid density within the stent lumen without soft tissue components to suggest tumor ingrowth. The stomach remains decompressed with stable postsurgical changes proximally. No significant bowel distension, wall thickening or surrounding inflammation. The appendix appears normal. There is a patent distal colonic anastomosis. Vascular/Lymphatic: No enlarged abdominopelvic lymph nodes are identified. Diffuse aortic and branch vessel  atherosclerosis without evidence of aneurysm or acute large vessel occlusion. There is chronic venous occlusion at the portal splenic confluence. Reproductive: Prostate brachytherapy seeds. Other: Postsurgical changes in the anterior abdominal wall. Generalized subcutaneous and mesenteric edema has mildly progressed compared with  the prior CT. No ascites, focal fluid collection or pneumoperitoneum demonstrated. Musculoskeletal: No acute or significant osseous findings. Multilevel spondylosis. IMPRESSION: 1. Cholelithiasis with gallbladder wall thickening and pericholecystic inflammation, similar to previous CT. Since the prior CT, the patient had a cholecystostomy tube placed and removed. Findings could reflect ongoing cholecystitis. 2. Interval duodenal stent placement with decompression of the stomach. 3. Grossly stable ill-defined pancreatic head mass with associated pancreatic ductal dilatation and chronic venous occlusion. No definite evidence of metastatic disease. 4. Metallic biliary stent remains in place with persistent pneumobilia. 5. Generalized subcutaneous and mesenteric edema has mildly progressed compared with the prior CT. 6.  Aortic Atherosclerosis (ICD10-I70.0). Electronically Signed   By: Carey Bullocks M.D.   On: 10/03/2023 12:23    Labs:  CBC: Recent Labs    10/03/23 0944 10/04/23 0350 10/05/23 0024 10/05/23 1617 10/06/23 0814  WBC 2.9* 2.5* 6.7  --  15.8*  HGB 9.1* 8.7* 9.0* 8.9* 9.8*  HCT 28.5* 28.0* 28.7* 29.0* 30.3*  PLT 139* 118* 100*  --  173    COAGS: Recent Labs    12/14/22 0037 01/09/23 1635 09/01/23 2120 09/02/23 1747 09/03/23 0404 09/04/23 0101 09/04/23 1041 09/05/23 0500 09/06/23 0340 10/04/23 0350  INR 1.4* 1.3*  --  1.4*  --   --   --   --   --  1.4*  APTT  --   --    < >  --    < > 90* 94* 99* 63*  --    < > = values in this interval not displayed.    BMP: Recent Labs    10/03/23 0944 10/04/23 0350 10/05/23 0024 10/06/23 0814  NA 136  137 136 131*  K 3.3* 3.1* 2.8* 3.3*  CL 107 106 106 103  CO2 22 23 21* 22  GLUCOSE 113* 93 144* 147*  BUN 25* 22 29* 34*  CALCIUM 8.6* 8.3* 8.0* 7.9*  CREATININE 1.06 1.17 1.45* 1.95*  GFRNONAA >60 >60 48* 34*    LIVER FUNCTION TESTS: Recent Labs    09/14/23 0903 09/29/23 1412 10/03/23 0944 10/06/23 0814  BILITOT 0.4 0.3 0.6 0.4  AST 13* 15 21 20   ALT 9 10 14 14   ALKPHOS 62 51 54 51  PROT 6.2* 6.0* 6.0* 5.6*  ALBUMIN 3.3* 3.3* 2.9* 2.4*    Assessment and Plan: 83 yo male with hx pancreatic cancer with CBD stenting, recent abdominal pain/cholecystitis with perc GB drain placement 09/03/23 and subsequent dislodgement; now admitted with recurrent abd pain ; s/p GB drain replacement 4/1; pt with bacteremic episode post tube placement/ transferred to ICU ; currently afebrile; BP ok; WBC 15.8 (6.7), hgb 9.8(8.9), creat 1.95(1.45), t bili nl; f/u CT A/P yesterday with no acute findings; bile cx - prelim mod citrobacter; blood cx- neg to date; cont current tx, hydrate/antbx/lab checks/drain flushes; pt scheduled for f/u drain exchange on 11/16/23   Electronically Signed: D. Jeananne Rama, PA-C 10/06/2023, 2:28 PM   I spent a total of 15 Minutes at the the patient's bedside AND on the patient's [name] floor or unit, greater than 50% of which was counseling/coordinating care for gallbladder drain    Patient ID: Marcus [name], male   DOB: [birth year], 83 y.o.   MRN: [id number]

## 2023-10-06 NOTE — Progress Notes (Signed)
 NAME:  Marcus [name], MRN:  [id number], DOB:  Jul 03, 1941, LOS: 3 ADMISSION DATE:  10/03/2023, CONSULTATION DATE:  10/05/23 REFERRING MD:  Dr. Lowell Guitar, CHIEF COMPLAINT:  hypotension   History of Present Illness:  29 yoM with PMH significant for but not limited to afib on eliquis, HTN, CAD, combined systolic and diastolic HF, AV block w/ PPM, OSA on CPAP, pancreatic cancer (dx 11/2022), and recent acute cholecystitis s/p cholecystostomy tube placement on 3/1 as not a surgery candidate and duodenal stent placement 2/2 gastric outlet obstruction.  Cholecystostomy tube removed on 3/1 after found to be  dislodged.  Presented back with recurrent abd pain and nausea without vomiting x 3 days, no fevers.  Pt neutropenicEmpiric zosyn started.  Underwent CT a/p, RUQ [place] and HIDA scan with findings of ongoing cholecystitis and recurrent cystic duct obstruction.  Underwent IR placement of percutaneous cholecystostomy tube.  Since pt has had progressive hypotension despite LR 2L bolus and started on peripheral norepinephrine.  Lactic this am  2.7.  New AKI and minimal UOP today. Repeat CT a/p pending.  PCCM consulted for further management.   Pt denies any specific complaints currently.  Had some upper abd pain worse with inspiration earlier but pain has been well managed.  Slight dizziness earlier now resolved, maybe slight headache.  No appetite.  Reports has barely ate anything since Sunday and noted 35lb wt loss since January of this year.   Pertinent  Medical History   Past Medical History:  Diagnosis Date   Acute meniscal tear of knee left   Arthritis    generalized   AV block 08/01/2018   Benign essential hypertension 01/18/2007   Bronchiectasis with acute exacerbation 06/23/2020   Chest pain, atypical 08/23/2014   Chronic cough 10/07/2014   Coronary artery disease    Degeneration of lumbar intervertebral disc 10/14/2017   Diverticulosis of colon 07/07/2005   Elevated PSA 06/19/2014   GERD  (gastroesophageal reflux disease) 01/18/2007   Gout, unspecified 01/18/2007   Hemorrhoids 01/19/2011   Hiatal hernia    History of colonic polyps 01/18/2007   Insomnia, unspecified 08/21/2007   Iron deficiency anemia, unspecified  01/19/2011   Lumbar post-laminectomy syndrome 10/14/2017   Lumbar spondylolysis    Lumbar strain 12/14/2011   Obstructive sleep apnea 01/18/2007   uses CPAP nightly   Paraesophageal hernia    large   Primary osteoarthritis of knee 05/23/2015   Prostate cancer (HCC) 2016   S/P knee replacement 05/23/2015   Sensorineural hearing loss, bilateral    Vitamin B12 deficiency    Vitamin D deficiency 10/28/2009   Significant Hospital Events: Including procedures, antibiotic start and stop dates in addition to other pertinent events   3/31 admit 4/2 PCCM consult  Interim History / Subjective:  NE 9 Afebrile No complaints, voided multiple times overnight, sCr up   Objective   Blood pressure (!) 124/45, pulse 66, temperature 97.8 F (36.6 C), temperature source Axillary, resp. rate (!) 25, height 5\' 8"  (1.727 m), weight 83.9 kg, SpO2 (!) 88%.        Intake/Output Summary (Last 24 hours) at 10/06/2023 9147 Last data filed at 10/06/2023 0505 Gross per 24 hour  Intake 3213.32 ml  Output 475 ml  Net 2738.32 ml   Filed Weights   10/03/23 0920  Weight: 83.9 kg    Examination: General:  Pleasant elderly male lying in bed in NAD HEENT: MM pink/moist Neuro: Alert, appropriate, MAE x 3 CV: AV paced, port right chest site wnl, accessed  PULM:  non labored, CTA, RA GI: obese, soft, bs+, mild soreness at RUQ biliary site- minimal drainage, light bilious, last BM several days ago Extremities: warm/dry, no tibial edema, trace ankle Skin: no rashes   UOP plus 2 unmeasured occurrences  Biliary tube > 25 ml/ 24hrs  Labs reviewed> Na 131, K 3.3, Mag 2, BUN/ sCr 29/ 1.45> 34/ 1.95, WBC 2.5> 6.7> 15.8, H/H stable, plts better 100> 173   Resolved Hospital  Problem list    Assessment & Plan:   Septic shock 2/2 acute cholecystitis and cystic duct obstruction s/p IR perc chole tube placement 4/1 Lactic acidosis, improving  - cont to wean NE for MAP goal > 65 via port, still having lower but improved diastolic pressures but hopeful we can rapidly wean today  - lactic down trending yesterday - WBC up but afebrile and clinically improving - follow cultures> ngtd - cont zosyn - monitor WBC/ fever curve   AKI- likely multifactorial to poor PO intake, sepsis, hypotension, and contrast Hypokalemia Hyponatremia Hx BPH - sCr up but decent UOP overnight w/2 unmeasured occurrences  - no concerning electrolyte derangements - check urine lytes - monitor for bladder retention - hemodynamic support as above  - flomax/ ditropan  - trend renal indices  - strict I/Os, daily wts - avoid nephrotoxins, renal dose meds, hemodynamic support as above   HTN Hx HFpEF, HFrEF - prior echo 45-50%, G1DD, normal RV - hold anti-hypertensive's while on pressors- norvasc, valsartan-HCTZ   Thrombocytopenia- suspected related to sepsis  Normocytic anemia - H/H stable, plts improving - trend CBC   Afib - last dose eliquis 3/30.  Remains currently AV paced - cont to optimize electrolytes - cont to hold eliquis for now   Pancreatic adenocarcinoma  - Followed by Dr. Truett Perna.  As of 3/27, decided to hold on further radiation and xeloda treatments, referred to palliative care and started on prn ativan.  He is a DNR/ DNI.     Failure to thrive  Protein calorie malnutrition - 35lb wt loss since January - maximize nutrition as able   Best Practice (right click and "Reselect all SmartList Selections" daily)   Diet/type: Regular consistency (see orders) DVT prophylaxis LMWH Pressure ulcer(s): N/A GI prophylaxis: N/A Lines: N/A- port right chest accessed Foley:  N/A Code Status:  DNR/ DNI Last date of multidisciplinary goals of care discussion  [ongoing]  No family at bedside  Labs   CBC: Recent Labs  Lab 09/29/23 1412 10/03/23 0944 10/04/23 0350 10/05/23 0024 10/05/23 1617  WBC 2.8* 2.9* 2.5* 6.7  --   NEUTROABS 1.9  --   --  6.1  --   HGB 9.3* 9.1* 8.7* 9.0* 8.9*  HCT 29.7* 28.5* 28.0* 28.7* 29.0*  MCV 84.6 85.3 88.9 87.0  --   PLT 167 139* 118* 100*  --     Basic Metabolic Panel: Recent Labs  Lab 09/29/23 1412 10/03/23 0944 10/04/23 0350 10/05/23 0024  NA 139 136 137 136  K 3.9 3.3* 3.1* 2.8*  CL 107 107 106 106  CO2 27 22 23  21*  GLUCOSE 119* 113* 93 144*  BUN 23 25* 22 29*  CREATININE 0.95 1.06 1.17 1.45*  CALCIUM 8.8* 8.6* 8.3* 8.0*  MG  --   --   --  1.8   GFR: Estimated Creatinine Clearance: 40.7 mL/min (A) (by C-G formula based on SCr of 1.45 mg/dL (H)). Recent Labs  Lab 09/29/23 1412 10/03/23 0944 10/03/23 1146 10/03/23 1448 10/04/23 0350 10/05/23  0024 10/05/23 1058 10/05/23 1423 10/05/23 1617  WBC 2.8* 2.9*  --   --  2.5* 6.7  --   --   --   LATICACIDVEN  --   --    < > 0.7  --   --  2.7* 2.3* 2.3*   < > = values in this interval not displayed.    Liver Function Tests: Recent Labs  Lab 09/29/23 1412 10/03/23 0944  AST 15 21  ALT 10 14  ALKPHOS 51 54  BILITOT 0.3 0.6  PROT 6.0* 6.0*  ALBUMIN 3.3* 2.9*   Recent Labs  Lab 10/03/23 0944  LIPASE 23   No results for input(s): "AMMONIA" in the last 168 hours.  ABG    Component Value Date/Time   PHART 7.4 10/04/2023 1733   PCO2ART 36 10/04/2023 1733   PO2ART 188 (H) 10/04/2023 1733   HCO3 22.3 10/04/2023 1733   TCO2 26 09/01/2023 1454   ACIDBASEDEF 2.0 10/04/2023 1733   O2SAT 100 10/04/2023 1733     Coagulation Profile: Recent Labs  Lab 10/04/23 0350  INR 1.4*    Cardiac Enzymes: No results for input(s): "CKTOTAL", "CKMB", "CKMBINDEX", "TROPONINI" in the last 168 hours.  HbA1C: Hgb A1c MFr Bld  Date/Time Value Ref Range Status  11/24/2022 03:27 PM 6.3 4.6 - 6.5 % Final    Comment:    Glycemic Control  Guidelines for People with Diabetes:Non Diabetic:  <6%Goal of Therapy: <7%Additional Action Suggested:  >8%   03/04/2017 08:08 AM 6.0 4.6 - 6.5 % Final    Comment:    Glycemic Control Guidelines for People with Diabetes:Non Diabetic:  <6%Goal of Therapy: <7%Additional Action Suggested:  >8%     CBG: Recent Labs  Lab 10/04/23 1755 10/05/23 1126  GLUCAP 88 225*    Allergies No Known Allergies   Home Medications  Prior to Admission medications   Medication Sig Start Date End Date Taking? Authorizing Provider  acetaminophen (TYLENOL) 325 MG tablet Take 2 tablets (650 mg total) by mouth every 6 (six) hours as needed for mild pain (or Fever >/= 101). Patient taking differently: Take 650 mg by mouth every 6 (six) hours as needed for mild pain (pain score 1-3) or moderate pain (pain score 4-6) (or Fever >/= 101). 01/13/23  Yes Sheikh, Omair Latif, DO  apixaban (ELIQUIS) 5 MG TABS tablet Take 1 tablet (5 mg total) by mouth 2 (two) times daily. 02/23/23 10/04/23 Yes Nafziger, Kandee Keen, NP  cyanocobalamin (VITAMIN B12) 500 MCG tablet Take 1 tablet by mouth 2 (two) times daily. 07/26/23  Yes [provider]  eszopiclone 3 MG TABS Take 1 tablet (3 mg total) by mouth at bedtime. Take immediately before bedtime. Patient taking differently: Take 3 mg by mouth at bedtime as needed (sleep). 09/09/23  Yes Nafziger, Kandee Keen, NP  oxybutynin (DITROPAN) 5 MG tablet Take 5 mg by mouth 2 (two) times daily as needed for bladder spasms (urinary incontinence).   Yes [provider]  oxyCODONE (ROXICODONE) 5 MG immediate release tablet Take 1 tablet (5 mg total) by mouth every 6 (six) hours as needed for severe pain (pain score 7-10). 09/09/23  Yes Nafziger, Kandee Keen, NP  tamsulosin (FLOMAX) 0.4 MG CAPS capsule Take 1 capsule by mouth daily. 06/21/23  Yes [provider]  valsartan-hydrochlorothiazide (DIOVAN-HCT) 160-25 MG tablet Take 1 tablet by mouth daily.   Yes [provider]  allopurinol  (ZYLOPRIM) 300 MG tablet Take 300 mg by mouth daily. Patient not taking: Reported on 10/04/2023  [provider]  amLODipine (NORVASC) 10 MG tablet Take 10 mg by mouth daily. Patient not taking: Reported on 10/04/2023    [provider]  capecitabine (XELODA) 500 MG tablet Take 3 tablets (1,500 mg total) by mouth 2 (two) times daily after a meal. Take Monday-Friday. Take only days of radiation. Patient not taking: Reported on 10/04/2023 08/25/23   [name], MD  LORazepam (ATIVAN) 0.5 MG tablet Take 1 tablet (0.5 mg total) by mouth every 8 (eight) hours as needed for anxiety. Patient not taking: Reported on 10/04/2023 09/29/23   Rana Snare, NP  predniSONE (DELTASONE) 10 MG tablet Take 1 tablet (10 mg total) by mouth daily with breakfast. Patient not taking: Reported on 10/04/2023 09/14/23   Rana Snare, NP     Critical care time: 38 mins      Posey Boyer, MSN, AG-ACNP-BC [facility] 10/06/2023, 7:12 AM  See Amion for pager If no response to pager , please call [phone] until 7pm After 7:00 pm call Elink  [phone]

## 2023-10-06 NOTE — Progress Notes (Addendum)
 Marcus [name]   DOB:08/25/1940   NW#:[id number]      ASSESSMENT & PLAN:  Pancreatic cancer - Diagnosed 12/03/2022. - Started on chemotherapy regimen with gemcitabine and Abraxane on 01/07/2023.  Completed cycle 12 on 06/24/2023.   -Subsequently planned for radiation therapy plus concurrent Xeloda. - Medical oncology/Dr. Truett Perna following.  Follow-up outpatient at the cancer center as scheduled.   Abdominal pain - Likely related to malignancy - Patient rates pain 2/10 today. - Status post hepatobiliary/HIDA scan done 10/04/2023 - Gallbladder drain placement IR 10/04/2023.  Flushes ongoing - Continue judicious pain management   Leukopenia--> leukocytosis - WBC low 2.5 --> 15.8 today  - Likely secondary to infection - Patient remains afebrile, monitor fever curve - Continue IV antibiotics as ordered - Continue to monitor CBC with differential   Anemia - Hemoglobin improving 9.8 today - Baseline hemoglobin appears to be in the 8-9 range. - No transfusional intervention required at this time - Continue to monitor CBC with differential   Thrombocytopenia - Mild - Platelets improving 1 73K today - Continue to monitor CBC with differential   A-fib Hypertension - Continue to monitor blood pressure and vitals closely - Medicine following      Code Status DNR-Limited  Subjective:  Patient sitting out of bed in chair at bedside.  Reports that he is feeling much better today and that he ate for the first time a small amount.  Rates his abdominal pain as 2 out of 10.  Denies other acute GI symptoms.  No acute distress is noted.  Objective:  Vitals:   10/06/23 1115 10/06/23 1145  BP: (!) 87/45 (!) 88/48  Pulse: (!) 58 60  Resp: 15 13  Temp:    SpO2: 96% 97%     Intake/Output Summary (Last 24 hours) at 10/06/2023 1159 Last data filed at 10/06/2023 1128 Gross per 24 hour  Intake 3061.52 ml  Output 450 ml  Net 2611.52 ml     REVIEW OF SYSTEMS:   Constitutional: Denies  fevers, chills or abnormal night sweats Eyes: Denies blurriness of vision, double vision or watery eyes Ears, nose, mouth, throat, and face: Denies mucositis or sore throat Respiratory: Denies cough, dyspnea or wheezes Cardiovascular: Denies palpitation, chest discomfort or lower extremity swelling Gastrointestinal: + Improving abdominal pain Skin: Denies abnormal skin rashes Lymphatics: Denies new lymphadenopathy or easy bruising Neurological: Denies numbness, tingling or new weaknesses Behavioral/Psych: Mood is stable, no new changes  All other systems were reviewed with the patient and are negative.  PHYSICAL EXAMINATION: ECOG PERFORMANCE STATUS: 2 - Symptomatic, <50% confined to bed  Vitals:   10/06/23 1115 10/06/23 1145  BP: (!) 87/45 (!) 88/48  Pulse: (!) 58 60  Resp: 15 13  Temp:    SpO2: 96% 97%   Filed Weights   10/03/23 0920  Weight: 185 lb (83.9 kg)    GENERAL: alert, no distress and comfortable SKIN: skin color, texture, turgor are normal, no rashes or significant lesions EYES: normal, conjunctiva are pink and non-injected, sclera clear OROPHARYNX: no exudate, no erythema and lips, buccal mucosa, and tongue normal  NECK: supple, thyroid normal size, non-tender, without nodularity LYMPH: no palpable lymphadenopathy in the cervical, axillary or inguinal LUNGS: clear to auscultation and percussion with normal breathing effort HEART: regular rate & rhythm and no murmurs and no lower extremity edema ABDOMEN: abdomen soft, non-tender and normal bowel sounds MUSCULOSKELETAL: no cyanosis of digits and no clubbing  PSYCH: alert & oriented x 3 with fluent speech NEURO: no  focal motor/sensory deficits   All questions were answered. The patient knows to call the clinic with any problems, questions or concerns.   The total time spent in the appointment was 40 minutes encounter with patient including review of chart and various tests results, discussions about plan of care  and coordination of care plan  [name], NP 10/06/2023 11:59 AM    Labs Reviewed:  Lab Results  Component Value Date   WBC 15.8 (H) 10/06/2023   HGB 9.8 (L) 10/06/2023   HCT 30.3 (L) 10/06/2023   MCV 85.8 10/06/2023   PLT 173 10/06/2023   Recent Labs    09/29/23 1412 10/03/23 0944 10/04/23 0350 10/05/23 0024 10/06/23 0814  NA 139 136 137 136 131*  K 3.9 3.3* 3.1* 2.8* 3.3*  CL 107 107 106 106 103  CO2 27 22 23  21* 22  GLUCOSE 119* 113* 93 144* 147*  BUN 23 25* 22 29* 34*  CREATININE 0.95 1.06 1.17 1.45* 1.95*  CALCIUM 8.8* 8.6* 8.3* 8.0* 7.9*  GFRNONAA >60 >60 >60 48* 34*  PROT 6.0* 6.0*  --   --  5.6*  ALBUMIN 3.3* 2.9*  --   --  2.4*  AST 15 21  --   --  20  ALT 10 14  --   --  14  ALKPHOS 51 54  --   --  51  BILITOT 0.3 0.6  --   --  0.4  BILIDIR  --   --   --   --  0.1  IBILI  --   --   --   --  0.3    Studies Reviewed:  CT ABDOMEN PELVIS W CONTRAST Result Date: 10/05/2023 CLINICAL DATA:  Abdominal pain, acute, nonlocalized post cholecystostomy catheter placement procedure pain EXAM: CT ABDOMEN AND PELVIS WITH CONTRAST TECHNIQUE: Multidetector CT imaging of the abdomen and pelvis was performed using the standard protocol following bolus administration of intravenous contrast. RADIATION DOSE REDUCTION: This exam was performed according to the departmental dose-optimization program which includes automated exposure control, adjustment of the mA and/or kV according to patient size and/or use of iterative reconstruction technique. CONTRAST:  75mL OMNIPAQUE IOHEXOL 300 MG/ML  SOLN COMPARISON:  10/03/2023 FINDINGS: Lower chest: Small pleural effusions right greater than left have developed. No pneumothorax. Patchy consolidation/atelectasis with air bronchograms posteriorly in the lung bases, new since previous. Hepatobiliary: Interval transhepatic pigtail cholecystostomy catheter placement, well position, with decompression of the gallbladder. Persistent gallbladder wall  thickening in some regional inflammatory/edematous change. Metallic stent in the CBD stable, with pneumobilia implying patency of the stent. Stable scattered hepatic cysts, largest 1.6 cm in the posterior right lobe. Pancreas: Marked pancreatic ductal dilatation and parenchymal atrophy as before, with poor delineation of the pancreatic head mass, nearly encircling the SMV and abutting the SMA. No new peripancreatic fluid collections. Spleen: Normal in size without focal abnormality. Adrenals/Urinary Tract: No adrenal lesion. 6 mm linear calcification in the right renal hilum probably atherosclerotic. No definite urolithiasis. No hydronephrosis or worrisome renal lesion. The urinary bladder is nondistended. Stomach/Bowel: Post gastroplasty. Patent stent to the second portion of the duodenum. Distal duodenum is physiologically distended. Small bowel is nondilated, with incomplete distal passage of the oral contrast material. Normal appendix. The colon is partially distended, without acute finding. Anastomotic staple line in the distal sigmoid segment. Vascular/Lymphatic: Moderate scattered aortoiliac calcified plaque without aneurysm. Narrowing of the distal superior mesenteric vein and proximal main portal vein; intrahepatic portal venous branches patent. No abdominal  or pelvic adenopathy localized. Reproductive: Multiple metallic seeds in the prostate. Other: Small volume pelvic ascites, new since previous. No free air. Musculoskeletal: Post ventral hernia repair. Vertebral endplate spurring at multiple levels in the lower thoracic spine. Multilevel lumbar spondylitic change with fusion L3-L5. Chronic appearing sacral fracture deformity. IMPRESSION: 1. Interval cholecystostomy catheter placement, with decompression of the gallbladder, no hematoma or other apparent complication. 2. New small volume pelvic ascites. 3. New small pleural effusions right greater than left, with patchy consolidation/atelectasis  posteriorly in the lung bases. 4. Stable pancreatic head mass with pancreatic ductal dilatation, parenchymal atrophy, encasement of the portosplenic confluence. Electronically Signed   By: Corlis Leak M.D.   On: 10/05/2023 15:01   DG CHEST PORT 1 VIEW Result Date: 10/04/2023 CLINICAL DATA:  Hypoxia EXAM: PORTABLE CHEST 1 VIEW COMPARISON:  09/01/2023 FINDINGS: Left pacer remains in place, unchanged. Heart is normal size. Aortic atherosclerosis. Bilateral lower lobe airspace opacities, slightly increased since prior study. No effusions or acute bony abnormality. IMPRESSION: Bilateral lower lobe atelectasis or infiltrates. Electronically Signed   By: Charlett Nose M.D.   On: 10/04/2023 18:27   IR Perc Cholecystostomy Result Date: 10/04/2023 INDICATION: Acute cholecystitis. Prior cholecystostomy drain placed 09/03/2023, dislodged 09/22/2023. EXAM: ULTRASOUND AND FLUOROSCOPIC-GUIDED CHOLECYSTOSTOMY TUBE PLACEMENT COMPARISON:  US Abdomen, 10/03/2023.  NM HIDA, 10/04/2023. MEDICATIONS: The patient is currently admitted to the hospital and on intravenous antibiotics. Antibiotics were administered within an appropriate time frame prior to skin puncture. ANESTHESIA/SEDATION: Moderate (conscious) sedation was employed during this procedure. A total of Versed 2 mg and Fentanyl 100 mcg was administered intravenously. Moderate Sedation Time: 21 minutes. The patient's level of consciousness and vital signs were monitored continuously by radiology nursing throughout the procedure under my direct supervision. CONTRAST:  20mL OMNIPAQUE IOHEXOL 300 MG/ML SOLN - administered into the gallbladder fossa. FLUOROSCOPY TIME:  Fluoroscopic dose; 6 mGy COMPLICATIONS: None immediate. PROCEDURE: Informed written consent was obtained from the patient after a discussion of the risks, benefits and alternatives to treatment. Questions regarding the procedure were encouraged and answered. A timeout was performed prior to the initiation of the  procedure. The right upper abdominal quadrant was prepped and draped in the usual sterile fashion, and a sterile drape was applied covering the operative field. Maximum barrier sterile technique with sterile gowns and gloves were used for the procedure. A timeout was performed prior to the initiation of the procedure. Local anesthesia was provided with 1% lidocaine with epinephrine. Ultrasound scanning of the RIGHT upper quadrant demonstrates a distended gallbladder. Utilizing a transhepatic approach, a 22 gauge needle was advanced into the gallbladder under direct ultrasound guidance. An ultrasound image was saved for documentation purposes. Appropriate intraluminal puncture was confirmed with the efflux of bile and advancement of an 0.018 wire into the gallbladder lumen. The needle was exchanged for an Accustick set. A small amount of contrast was injected to confirm appropriate intraluminal positioning. Over a Benson wire, a 10 Fr cholecystomy tube was advanced into the gallbladder fossa, coiled and locked. Bile was aspirated and a small amount of contrast was injected as several post procedural spot radiographic images were obtained in various obliquities. The catheter was secured to the skin with suture, connected to a drainage bag and a dressing was placed. The patient tolerated the procedure well without immediate post procedural complication. IMPRESSION: Successful ultrasound and fluoroscopic guided placement of a 10 Fr cholecystostomy tube. RECOMMENDATIONS: The patient will return to Vascular [facility]iology (VIR) for routine drainage catheter evaluation and exchange in  6-8 weeks. Roanna Banning, MD [facility] Electronically Signed   By: Roanna Banning M.D.   On: 10/04/2023 16:19   NM Hepatobiliary Liver Func Result Date: 10/04/2023 CLINICAL DATA:  Cholelithiasis and cholecystitis. Recent percutaneous cholecystostomy, which no longer in  place. Internal biliary stent. Pancreatic carcinoma. EXAM: NUCLEAR MEDICINE HEPATOBILIARY IMAGING TECHNIQUE: Sequential images of the abdomen were obtained out to 60 minutes following intravenous administration of radiopharmaceutical. Because no gallbladder activity was visualized at this time, 3 mg morphine was administered intravenously, and imaging was continued for another 30 minutes RADIOPHARMACEUTICALS:  5.3 mCi Tc-68m  Choletec IV COMPARISON:  None Available. FINDINGS: Prompt uptake and biliary excretion of activity by the liver is seen. Biliary activity promptly passes into small bowel, consistent with patent common bile duct. No gallbladder activity is visualized, either before or following administration of intravenous morphine, consistent with cystic duct obstruction. Reflux of biliary activity into the stomach is also noted. IMPRESSION: Absence of gallbladder activity, consistent with cystic duct obstruction. No evidence of biliary obstruction. Bile reflux noted. Electronically Signed   By: Danae Orleans M.D.   On: 10/04/2023 10:33   US Abdomen Limited RUQ (LIVER/GB) Result Date: 10/03/2023 CLINICAL DATA:  [id number] Thickening of wall of gallbladder [id number] Cholelithiasis [id number] EXAM: ULTRASOUND ABDOMEN LIMITED RIGHT UPPER QUADRANT COMPARISON:  US Abdomen, 01/11/2023. CT of pelvis, 10/03/2023 and 09/01/2023. FINDINGS: Suboptimal evaluation, with poor acoustic penetration secondary to patient habitus. Gallbladder: Dependent echogenic gallstone, within a nondistended gallbladder, measuring up to 2.3 cm. Gallbladder wall thickening, measuring 0.6 cm with mild pericholecystic fluid. No sonographic Murphy sign noted by sonographer. Common bile duct: Diameter: Poorly visualized. Liver: Increased hepatic parenchymal echogenicity. Ovoid, hypoechoic RIGHT liver lesion measuring 2.2 x 1.4 x 1.5 cm without internal vascularity likely consistent with a cysts. Portal vein is patent on color Doppler imaging  with normal direction of blood flow towards the liver. Other: No perihepatic fluid. IMPRESSION: 1. Nondistended gallbladder with gallstone, relative wall thickening and pericholecystic fluid. Examination is equivocal for calculus cholecystitis, consider NM HIDA for further evaluation if continued clinical concern. 2. Echogenic liver. Findings most commonly seen in hepatic steatosis, though may also represent hepatitis and/or fibrosis. Electronically Signed   By: Roanna Banning M.D.   On: 10/03/2023 14:54   CT ABDOMEN PELVIS W CONTRAST Result Date: 10/03/2023 CLINICAL DATA:  Abdominal pain, acute, nonlocalized pancreatic cancer, hx of bilary stent, pain worse in ruq Right lower quadrant abdominal pain for 3 days with nausea. Recently completed radiation therapy. EXAM: CT ABDOMEN AND PELVIS WITH CONTRAST TECHNIQUE: Multidetector CT imaging of the abdomen and pelvis was performed using the standard protocol following bolus administration of intravenous contrast. RADIATION DOSE REDUCTION: This exam was performed according to the departmental dose-optimization program which includes automated exposure control, adjustment of the mA and/or kV according to patient size and/or use of iterative reconstruction technique. CONTRAST:  OMNIPAQUE IOHEXOL 300 MG/ML  SOLN COMPARISON:  Abdominopelvic CT 09/01/2023 and 08/14/2023. Abdominal radiographs 09/06/2023. FINDINGS: Lower chest: Chronic elevation of the left hemidiaphragm with associated chronic bibasilar atelectasis. The overall aeration of the left lung base has improved from the prior study. No significant pleural or pericardial effusion. Patient has a pacemaker. There is aortic and coronary artery atherosclerosis. Hepatobiliary: Grossly stable hepatic cysts. No suspicious liver lesions are identified. Metallic biliary stent is unchanged in position with persistent pneumobilia. Small gallstones, gallbladder wall thickening and pericholecystic inflammation, similar to  previous CT. Since the prior CT, the patient had  a cholecystostomy tube placed and removed. Pancreas: Grossly stable ill-defined 2.2 cm mass involving the pancreatic head on image 29/4. Persistent moderate pancreatic ductal dilatation. No acute peripancreatic fluid collections are identified. Spleen: Normal in size without focal abnormality. Adrenals/Urinary Tract: Both adrenal glands appear normal. No evidence of urinary tract calculus, suspicious renal lesion or hydronephrosis. Unchanged renal cysts for which no specific follow-up imaging is recommended. The bladder appears unremarkable for its degree of distention. Stomach/Bowel: No enteric contrast was administered. A duodenal stent is in place (placed 09/05/2023) and extending from the distal stomach into the 2nd portion of the duodenum, grossly unchanged from reference prior radiographs. There is fluid density within the stent lumen without soft tissue components to suggest tumor ingrowth. The stomach remains decompressed with stable postsurgical changes proximally. No significant bowel distension, wall thickening or surrounding inflammation. The appendix appears normal. There is a patent distal colonic anastomosis. Vascular/Lymphatic: No enlarged abdominopelvic lymph nodes are identified. Diffuse aortic and branch vessel atherosclerosis without evidence of aneurysm or acute large vessel occlusion. There is chronic venous occlusion at the portal splenic confluence. Reproductive: Prostate brachytherapy seeds. Other: Postsurgical changes in the anterior abdominal wall. Generalized subcutaneous and mesenteric edema has mildly progressed compared with the prior CT. No ascites, focal fluid collection or pneumoperitoneum demonstrated. Musculoskeletal: No acute or significant osseous findings. Multilevel spondylosis. IMPRESSION: 1. Cholelithiasis with gallbladder wall thickening and pericholecystic inflammation, similar to previous CT. Since the prior CT, the patient  had a cholecystostomy tube placed and removed. Findings could reflect ongoing cholecystitis. 2. Interval duodenal stent placement with decompression of the stomach. 3. Grossly stable ill-defined pancreatic head mass with associated pancreatic ductal dilatation and chronic venous occlusion. No definite evidence of metastatic disease. 4. Metallic biliary stent remains in place with persistent pneumobilia. 5. Generalized subcutaneous and mesenteric edema has mildly progressed compared with the prior CT. 6.  Aortic Atherosclerosis (ICD10-I70.0). Electronically Signed   By: Carey Bullocks M.D.   On: 10/03/2023 12:23   IR Sinus/Fist Tube Chk-Non GI Result Date: 09/22/2023 INDICATION: Retracted cholecystostomy, no output EXAM: Fluoroscopic evaluation and injection of the existing cholecystostomy. Cholecystostomy removal MEDICATIONS: None. ANESTHESIA/SEDATION: None. COMPLICATIONS: None immediate. PROCEDURE: Informed written consent was obtained from the patient after a thorough discussion of the procedural risks, benefits and alternatives. All questions were addressed. Maximal Sterile Barrier Technique was utilized including caps, mask, sterile gowns, sterile gloves, sterile drape, hand hygiene and skin antiseptic. A timeout was performed prior to the initiation of the procedure. Existing right upper quadrant cholecystostomy is clearly retracted. Injection confirms the catheter has retracted and is within the periphery of the right liver. Catheter was cut and removed. Kumpe catheter was advanced into the percutaneous tract. Contrast injection demonstrates no residual patent transhepatic tract to the gallbladder. Catheter and guidewire access removed. Access could not be re-established into the gallbladder. Following this, ultrasound of the right upper quadrant demonstrates no significant dilatation of the gallbladder. Cholelithiasis noted. Diffuse pneumobilia as before related to the known endoscopic biliary stent.  IMPRESSION: Retracted cholecystostomy within the periphery of the right liver. No residual patent percutaneous transhepatic tract to reestablish gallbladder access. Therefore the cholecystostomy was removed. Right upper quadrant limited ultrasound demonstrates no significant dilatation of the gallbladder. Gallstones are noted. No sonographic Murphy's sign. Findings discussed with the patient and his son. They understand if he has recurrent right upper quadrant pain, fevers, nausea and vomiting he may require cholecystostomy replacement in the future. They are in agreement with this plan. Electronically Signed  By: Osvaldo Shipper M.D.   On: 09/22/2023 17:16   Mr Furey went gallbladder drain placement 10/04/2023.  He developed fever and hypotension following the procedure.  He is now in the ICU on pressor support.  He has leukocytosis and acute renal injury.  A bowel culture is growing Citrobacter.  He reports feeling better today.  Recommendations: Antibiotics, management of sepsis syndrome per critical care medicine No treatment for the pancreas cancer planned at present Please call oncology as needed, outpatient follow-up is scheduled at the [facility]

## 2023-10-06 NOTE — Plan of Care (Signed)

## 2023-10-06 NOTE — Plan of Care (Signed)
  Problem: Health Behavior/Discharge Planning: Goal: Ability to manage health-related needs will improve Outcome: Progressing   Problem: Clinical Measurements: Goal: Ability to maintain clinical measurements within normal limits will improve Outcome: Progressing Goal: Will remain free from infection Outcome: Progressing Goal: Diagnostic test results will improve Outcome: Progressing Goal: Respiratory complications will improve Outcome: Progressing Goal: Cardiovascular complication will be avoided Outcome: Progressing   Problem: Coping: Goal: Level of anxiety will decrease Outcome: Progressing   Problem: Elimination: Goal: Will not experience complications related to bowel motility Outcome: Progressing Goal: Will not experience complications related to urinary retention Outcome: Progressing

## 2023-10-07 ENCOUNTER — Ambulatory Visit

## 2023-10-07 ENCOUNTER — Ambulatory Visit: Payer: Non-veteran care

## 2023-10-07 DIAGNOSIS — N179 Acute kidney failure, unspecified: Secondary | ICD-10-CM | POA: Diagnosis not present

## 2023-10-07 DIAGNOSIS — K810 Acute cholecystitis: Secondary | ICD-10-CM | POA: Diagnosis not present

## 2023-10-07 DIAGNOSIS — R6521 Severe sepsis with septic shock: Secondary | ICD-10-CM | POA: Diagnosis not present

## 2023-10-07 DIAGNOSIS — A419 Sepsis, unspecified organism: Secondary | ICD-10-CM | POA: Diagnosis not present

## 2023-10-07 LAB — CBC WITH DIFFERENTIAL/PLATELET
Abs Immature Granulocytes: 0.05 10*3/uL (ref 0.00–0.07)
Basophils Absolute: 0 10*3/uL (ref 0.0–0.1)
Basophils Relative: 0 %
Eosinophils Absolute: 0.2 10*3/uL (ref 0.0–0.5)
Eosinophils Relative: 3 %
HCT: 28.1 % — ABNORMAL LOW (ref 39.0–52.0)
Hemoglobin: 8.7 g/dL — ABNORMAL LOW (ref 13.0–17.0)
Immature Granulocytes: 1 %
Lymphocytes Relative: 7 %
Lymphs Abs: 0.4 10*3/uL — ABNORMAL LOW (ref 0.7–4.0)
MCH: 26.9 pg (ref 26.0–34.0)
MCHC: 31 g/dL (ref 30.0–36.0)
MCV: 87 fL (ref 80.0–100.0)
Monocytes Absolute: 0.4 10*3/uL (ref 0.1–1.0)
Monocytes Relative: 8 %
Neutro Abs: 3.9 10*3/uL (ref 1.7–7.7)
Neutrophils Relative %: 81 %
Platelets: 112 10*3/uL — ABNORMAL LOW (ref 150–400)
RBC: 3.23 MIL/uL — ABNORMAL LOW (ref 4.22–5.81)
RDW: 18.5 % — ABNORMAL HIGH (ref 11.5–15.5)
WBC: 4.9 10*3/uL (ref 4.0–10.5)
nRBC: 0 % (ref 0.0–0.2)

## 2023-10-07 LAB — AEROBIC/ANAEROBIC CULTURE W GRAM STAIN (SURGICAL/DEEP WOUND)

## 2023-10-07 LAB — BASIC METABOLIC PANEL WITH GFR
Anion gap: 7 (ref 5–15)
BUN: 33 mg/dL — ABNORMAL HIGH (ref 8–23)
CO2: 23 mmol/L (ref 22–32)
Calcium: 8 mg/dL — ABNORMAL LOW (ref 8.9–10.3)
Chloride: 104 mmol/L (ref 98–111)
Creatinine, Ser: 1.54 mg/dL — ABNORMAL HIGH (ref 0.61–1.24)
GFR, Estimated: 44 mL/min — ABNORMAL LOW (ref >60)
Glucose, Bld: 134 mg/dL — ABNORMAL HIGH (ref 70–99)
Potassium: 3.6 mmol/L (ref 3.5–5.1)
Sodium: 134 mmol/L — ABNORMAL LOW (ref 135–145)

## 2023-10-07 LAB — MAGNESIUM: Magnesium: 2.2 mg/dL (ref 1.7–2.4)

## 2023-10-07 MED ORDER — LOPERAMIDE HCL 2 MG PO CAPS
2.0000 mg | ORAL_CAPSULE | Freq: Once | ORAL | Status: AC
  Administered 2023-10-07: 2 mg via ORAL
  Filled 2023-10-07: qty 1

## 2023-10-07 MED ORDER — METRONIDAZOLE 500 MG/100ML IV SOLN
500.0000 mg | Freq: Two times a day (BID) | INTRAVENOUS | Status: DC
Start: 2023-10-07 — End: 2023-10-11
  Administered 2023-10-07 – 2023-10-11 (×9): 500 mg via INTRAVENOUS
  Filled 2023-10-07 (×9): qty 100

## 2023-10-07 MED ORDER — MIDODRINE HCL 5 MG PO TABS
15.0000 mg | ORAL_TABLET | Freq: Three times a day (TID) | ORAL | Status: DC
  Administered 2023-10-07 – 2023-10-08 (×4): 15 mg via ORAL
  Filled 2023-10-07 (×4): qty 3

## 2023-10-07 MED ORDER — SODIUM CHLORIDE 0.9 % IV SOLN
2.0000 g | Freq: Two times a day (BID) | INTRAVENOUS | Status: DC
  Administered 2023-10-07 – 2023-10-11 (×9): 2 g via INTRAVENOUS
  Filled 2023-10-07 (×10): qty 12.5

## 2023-10-07 MED ORDER — FLUDROCORTISONE ACETATE 0.1 MG PO TABS
0.1000 mg | ORAL_TABLET | Freq: Every day | ORAL | Status: DC
  Administered 2023-10-07: 0.1 mg via ORAL
  Filled 2023-10-07 (×2): qty 1

## 2023-10-07 MED ORDER — SENNA 8.6 MG PO TABS
1.0000 | ORAL_TABLET | Freq: Every day | ORAL | Status: DC | PRN
  Administered 2023-10-10: 8.6 mg via ORAL
  Filled 2023-10-07 (×2): qty 1

## 2023-10-07 NOTE — Progress Notes (Signed)
 NAME:  Marcus [name], MRN:  [id number], DOB:  07-04-1941, LOS: 4 ADMISSION DATE:  10/03/2023, CONSULTATION DATE:  10/05/23 REFERRING MD:  Dr. Lowell Guitar, CHIEF COMPLAINT:  hypotension   History of Present Illness:  66 yoM with PMH significant for but not limited to afib on eliquis, HTN, CAD, combined systolic and diastolic HF, AV block w/ PPM, OSA on CPAP, pancreatic cancer (dx 11/2022), and recent acute cholecystitis s/p cholecystostomy tube placement on 3/1 as not a surgery candidate and duodenal stent placement 2/2 gastric outlet obstruction.  Cholecystostomy tube removed on 3/1 after found to be  dislodged.  Presented back with recurrent abd pain and nausea without vomiting x 3 days, no fevers.  Pt neutropenicEmpiric zosyn started.  Underwent CT a/p, RUQ [place] and HIDA scan with findings of ongoing cholecystitis and recurrent cystic duct obstruction.  Underwent IR placement of percutaneous cholecystostomy tube.  Since pt has had progressive hypotension despite LR 2L bolus and started on peripheral norepinephrine.  Lactic this am  2.7.  New AKI and minimal UOP today. Repeat CT a/p pending.  PCCM consulted for further management.   Pt denies any specific complaints currently.  Had some upper abd pain worse with inspiration earlier but pain has been well managed.  Slight dizziness earlier now resolved, maybe slight headache.  No appetite.  Reports has barely ate anything since Sunday and noted 35lb wt loss since January of this year.   Pertinent  Medical History   Past Medical History:  Diagnosis Date   Acute meniscal tear of knee left   Arthritis    generalized   AV block 08/01/2018   Benign essential hypertension 01/18/2007   Bronchiectasis with acute exacerbation 06/23/2020   Chest pain, atypical 08/23/2014   Chronic cough 10/07/2014   Coronary artery disease    Degeneration of lumbar intervertebral disc 10/14/2017   Diverticulosis of colon 07/07/2005   Elevated PSA 06/19/2014   GERD  (gastroesophageal reflux disease) 01/18/2007   Gout, unspecified 01/18/2007   Hemorrhoids 01/19/2011   Hiatal hernia    History of colonic polyps 01/18/2007   Insomnia, unspecified 08/21/2007   Iron deficiency anemia, unspecified  01/19/2011   Lumbar post-laminectomy syndrome 10/14/2017   Lumbar spondylolysis    Lumbar strain 12/14/2011   Obstructive sleep apnea 01/18/2007   uses CPAP nightly   Paraesophageal hernia    large   Primary osteoarthritis of knee 05/23/2015   Prostate cancer (HCC) 2016   S/P knee replacement 05/23/2015   Sensorineural hearing loss, bilateral    Vitamin B12 deficiency    Vitamin D deficiency 10/28/2009   Significant Hospital Events: Including procedures, antibiotic start and stop dates in addition to other pertinent events   3/31 admit 4/2 PCCM consult, vasopressor support 4/3 weaning vasopressors  Interim History / Subjective:  Frustrated this am Worsening sharp intermittent RUQ/ right sided pain, better when in bed, worse with movement/ deeper inspiration.  No N/V C/o of bowel urgencies overnight, unable to have BM on BSC but then had bowel incontinence back in bed- has had hx of previously, but not in awhile Remains on NE , afebrile Pending am labs  Objective   Blood pressure (!) 109/52, pulse 63, temperature (!) 97.5 F (36.4 C), temperature source Oral, resp. rate 14, height 5\' 8"  (1.727 m), weight 83.9 kg, SpO2 98%.        Intake/Output Summary (Last 24 hours) at 10/07/2023 0735 Last data filed at 10/07/2023 0629 Gross per 24 hour  Intake 642.68 ml  Output  85 ml  Net 557.68 ml   Filed Weights   10/03/23 0920  Weight: 83.9 kg    Examination: General:  Pleasant elderly male sitting in bedside chair eating breakfast, intermittent grimaces/ guarding of RUQ HEENT: MM pink/moist Neuro: AO, appropriate, MAE CV: mostly AV paced PULM:  non labored, RA, clear, diminished R base GI:  feels more taut but denies c/o of distention, +bs,  tenderness RUQ, RUQ biliary drain- mostly clear serous drainage Extremities: warm/dry, trace > +1 ankle edema  Skin: no rashes   UOP > 1 unmeasured occurrence> unclear UOP overnight Biliary tube > 85 ml/ 24hrs  Labs pending  Resolved Hospital Problem list   Lactic acidosis  Assessment & Plan:   Septic shock 2/2 acute cholecystitis and cystic duct obstruction s/p IR perc chole tube placement 4/1, possible RLL PNA - CT 4/1> interval cholecystostomy catheter placement, w/decompression of the gallbladder, no hematoma or other apparent complication.  New small volume pelvic ascites, new small pleural effusions R>L, patchy consolidation/ atelectasis in lung bases, noted air bronchograms.  Stable pancreatic head mass with pancreatic ductal dilatation, parenchymal atrophy, encasement of the portosplenic confluence P:  - biliary culture > prelim w/ GNR, moderate C. Freundi> final report now showing mixed anaerobic flora.  Will have to verify this w/ micro.  Discussed with pharmacy, changed to cefepime/ flagyl this am for better coverage given ongoing pressor requirements and continued RUQ pain.  Repeating cx day 4 on abx not likely to give additional data.  Will continue current abx for now.  - cont to wean NE for MAP goal > 65  - cont midodrine - BC remain ngtd x 2 - WBC better today, remains afebrile but still having intermittent RUQ pain.  Last CT a/p 4/1 as above.  - multimodal pain management w/ bowel regimen - encourage IS/ pulm hygiene.  No cough, SOB, or O2 requirements - pending CBC, monitor clinically    AKI, improving - likely multifactorial to poor PO intake, sepsis, hypotension, and contrast Hypokalemia, resolved Hyponatremia, improving Hx BPH - FENA c/w pre-renal.  sCr improving today.  Continue to monitor I/Os - hemodynamic support as above  - flomax/ ditropan  - trend renal indices  - strict I/Os, daily wts - avoid nephrotoxins, renal dose meds, hemodynamic support as  above   HTN Hx HFpEF, HFrEF - prior echo 45-50%, G1DD, normal RV - hold anti-hypertensive's while on pressors- norvasc, valsartan-hydrochlorothiazide.  Midodrine added 4/3. Evidence of mild third-spacing, consider diuresis after shock resolved   Thrombocytopenia- suspected related to sepsis  Normocytic anemia - H/H stable - trend CBC   Afib Hx AV block s/p PPM - last dose eliquis 3/30.  Remains AV paced - cont to optimize electrolytes - cont to hold eliquis for now   Pancreatic adenocarcinoma  - Followed by Dr. Truett Perna.  As of 3/27, decided to hold on further radiation and xeloda treatments, referred to palliative care and started on prn ativan.  He is a DNR/ DNI.     Failure to thrive  Protein calorie malnutrition - 35lb wt loss since January.  Doing better with PO intake, small meals, protein shakes.  maximize nutrition as able   Best Practice (right click and "Reselect all SmartList Selections" daily)   Diet/type: Regular consistency (see orders) DVT prophylaxis LMWH Pressure ulcer(s): N/A GI prophylaxis: N/A Lines: N/A- port right chest accessed Foley:  N/A Code Status:  DNR/ DNI Last date of multidisciplinary goals of care discussion [ongoing]   Labs  CBC: Recent Labs  Lab 10/03/23 0944 10/04/23 0350 10/05/23 0024 10/05/23 1617 10/06/23 0814  WBC 2.9* 2.5* 6.7  --  15.8*  NEUTROABS  --   --  6.1  --  13.4*  HGB 9.1* 8.7* 9.0* 8.9* 9.8*  HCT 28.5* 28.0* 28.7* 29.0* 30.3*  MCV 85.3 88.9 87.0  --  85.8  PLT 139* 118* 100*  --  173    Basic Metabolic Panel: Recent Labs  Lab 10/03/23 0944 10/04/23 0350 10/05/23 0024 10/06/23 0814  NA 136 137 136 131*  K 3.3* 3.1* 2.8* 3.3*  CL 107 106 106 103  CO2 22 23 21* 22  GLUCOSE 113* 93 144* 147*  BUN 25* 22 29* 34*  CREATININE 1.06 1.17 1.45* 1.95*  CALCIUM 8.6* 8.3* 8.0* 7.9*  MG  --   --  1.8 2.0   GFR: Estimated Creatinine Clearance: 30.3 mL/min (A) (by C-G formula based on SCr of 1.95 mg/dL  (H)). Recent Labs  Lab 10/03/23 0944 10/03/23 1146 10/03/23 1448 10/04/23 0350 10/05/23 0024 10/05/23 1058 10/05/23 1423 10/05/23 1617 10/06/23 0814  WBC 2.9*  --   --  2.5* 6.7  --   --   --  15.8*  LATICACIDVEN  --    < > 0.7  --   --  2.7* 2.3* 2.3*  --    < > = values in this interval not displayed.    Liver Function Tests: Recent Labs  Lab 10/03/23 0944 10/06/23 0814  AST 21 20  ALT 14 14  ALKPHOS 54 51  BILITOT 0.6 0.4  PROT 6.0* 5.6*  ALBUMIN 2.9* 2.4*   Recent Labs  Lab 10/03/23 0944  LIPASE 23   No results for input(s): "AMMONIA" in the last 168 hours.  ABG    Component Value Date/Time   PHART 7.4 10/04/2023 1733   PCO2ART 36 10/04/2023 1733   PO2ART 188 (H) 10/04/2023 1733   HCO3 22.3 10/04/2023 1733   TCO2 26 09/01/2023 1454   ACIDBASEDEF 2.0 10/04/2023 1733   O2SAT 100 10/04/2023 1733     Coagulation Profile: Recent Labs  Lab 10/04/23 0350  INR 1.4*    Cardiac Enzymes: No results for input(s): "CKTOTAL", "CKMB", "CKMBINDEX", "TROPONINI" in the last 168 hours.  HbA1C: Hgb A1c MFr Bld  Date/Time Value Ref Range Status  11/24/2022 03:27 PM 6.3 4.6 - 6.5 % Final    Comment:    Glycemic Control Guidelines for People with Diabetes:Non Diabetic:  <6%Goal of Therapy: <7%Additional Action Suggested:  >8%   03/04/2017 08:08 AM 6.0 4.6 - 6.5 % Final    Comment:    Glycemic Control Guidelines for People with Diabetes:Non Diabetic:  <6%Goal of Therapy: <7%Additional Action Suggested:  >8%     CBG: Recent Labs  Lab 10/04/23 1755 10/05/23 1126  GLUCAP 88 225*    Allergies No Known Allergies   Home Medications  Prior to Admission medications   Medication Sig Start Date End Date Taking? Authorizing Provider  acetaminophen (TYLENOL) 325 MG tablet Take 2 tablets (650 mg total) by mouth every 6 (six) hours as needed for mild pain (or Fever >/= 101). Patient taking differently: Take 650 mg by mouth every 6 (six) hours as needed for mild pain  (pain score 1-3) or moderate pain (pain score 4-6) (or Fever >/= 101). 01/13/23  Yes Sheikh, Omair Latif, DO  apixaban (ELIQUIS) 5 MG TABS tablet Take 1 tablet (5 mg total) by mouth 2 (two) times daily. 02/23/23 10/04/23 Yes Nafziger, Kandee Keen,  NP  cyanocobalamin (VITAMIN B12) 500 MCG tablet Take 1 tablet by mouth 2 (two) times daily. 07/26/23  Yes [provider]  eszopiclone 3 MG TABS Take 1 tablet (3 mg total) by mouth at bedtime. Take immediately before bedtime. Patient taking differently: Take 3 mg by mouth at bedtime as needed (sleep). 09/09/23  Yes Nafziger, Kandee Keen, NP  oxybutynin (DITROPAN) 5 MG tablet Take 5 mg by mouth 2 (two) times daily as needed for bladder spasms (urinary incontinence).   Yes [provider]  oxyCODONE (ROXICODONE) 5 MG immediate release tablet Take 1 tablet (5 mg total) by mouth every 6 (six) hours as needed for severe pain (pain score 7-10). 09/09/23  Yes Nafziger, Kandee Keen, NP  tamsulosin (FLOMAX) 0.4 MG CAPS capsule Take 1 capsule by mouth daily. 06/21/23  Yes [provider]  valsartan-hydrochlorothiazide (DIOVAN-HCT) 160-25 MG tablet Take 1 tablet by mouth daily.   Yes [provider]  allopurinol (ZYLOPRIM) 300 MG tablet Take 300 mg by mouth daily. Patient not taking: Reported on 10/04/2023    [provider]  amLODipine (NORVASC) 10 MG tablet Take 10 mg by mouth daily. Patient not taking: Reported on 10/04/2023    [provider]  capecitabine (XELODA) 500 MG tablet Take 3 tablets (1,500 mg total) by mouth 2 (two) times daily after a meal. Take Monday-Friday. Take only days of radiation. Patient not taking: Reported on 10/04/2023 08/25/23   [name], MD  LORazepam (ATIVAN) 0.5 MG tablet Take 1 tablet (0.5 mg total) by mouth every 8 (eight) hours as needed for anxiety. Patient not taking: Reported on 10/04/2023 09/29/23   Rana Snare, NP  predniSONE (DELTASONE) 10 MG tablet Take 1 tablet (10 mg total) by mouth daily with  breakfast. Patient not taking: Reported on 10/04/2023 09/14/23   Rana Snare, NP     Critical care time: 32 mins      Posey Boyer, MSN, AG-ACNP-BC [facility] 10/07/2023, 7:35 AM  See Amion for pager If no response to pager , please call [phone] until 7pm After 7:00 pm call Elink  [phone]

## 2023-10-07 NOTE — Progress Notes (Signed)
 Patient ID: Marcus [name], male   DOB: [birth year], 83 y.o.   MRN: [id number]    Referring Physician(s): Girguis,D   Supervising Physician: Irish Lack  Patient Status:  [facility] - In-pt  Chief Complaint:  Hx pancreatic cancer with CBD stenting. Hx chronic cholecystitis, now admitted for recurrent abdominal pain ; s/p replacement of GB drain  4/1   Subjective:  Pt doing well, feeling improved. Had brief feeling of sharp pulling pain near drain site this morning, but none since. Otherwise no new complaints.   Allergies: Patient has no known allergies.  Medications: Prior to Admission medications   Medication Sig Start Date End Date Taking? Authorizing Provider  acetaminophen (TYLENOL) 325 MG tablet Take 2 tablets (650 mg total) by mouth every 6 (six) hours as needed for mild pain (or Fever >/= 101). Patient taking differently: Take 650 mg by mouth every 6 (six) hours as needed for mild pain (pain score 1-3) or moderate pain (pain score 4-6) (or Fever >/= 101). 01/13/23  Yes Sheikh, Omair Latif, DO  apixaban (ELIQUIS) 5 MG TABS tablet Take 1 tablet (5 mg total) by mouth 2 (two) times daily. 02/23/23 10/04/23 Yes Nafziger, Kandee Keen, NP  cyanocobalamin (VITAMIN B12) 500 MCG tablet Take 1 tablet by mouth 2 (two) times daily. 07/26/23  Yes [provider]  eszopiclone 3 MG TABS Take 1 tablet (3 mg total) by mouth at bedtime. Take immediately before bedtime. Patient taking differently: Take 3 mg by mouth at bedtime as needed (sleep). 09/09/23  Yes Nafziger, Kandee Keen, NP  oxybutynin (DITROPAN) 5 MG tablet Take 5 mg by mouth 2 (two) times daily as needed for bladder spasms (urinary incontinence).   Yes [provider]  oxyCODONE (ROXICODONE) 5 MG immediate release tablet Take 1 tablet (5 mg total) by mouth every 6 (six) hours as needed for severe pain (pain score 7-10). 09/09/23  Yes Nafziger, Kandee Keen, NP  tamsulosin (FLOMAX) 0.4 MG CAPS capsule Take 1 capsule by mouth daily. 06/21/23  Yes  [provider]  valsartan-hydrochlorothiazide (DIOVAN-HCT) 160-25 MG tablet Take 1 tablet by mouth daily.   Yes [provider]  allopurinol (ZYLOPRIM) 300 MG tablet Take 300 mg by mouth daily. Patient not taking: Reported on 10/04/2023    [provider]  amLODipine (NORVASC) 10 MG tablet Take 10 mg by mouth daily. Patient not taking: Reported on 10/04/2023    [provider]  capecitabine (XELODA) 500 MG tablet Take 3 tablets (1,500 mg total) by mouth 2 (two) times daily after a meal. Take Monday-Friday. Take only days of radiation. Patient not taking: Reported on 10/04/2023 08/25/23   [name], MD  LORazepam (ATIVAN) 0.5 MG tablet Take 1 tablet (0.5 mg total) by mouth every 8 (eight) hours as needed for anxiety. Patient not taking: Reported on 10/04/2023 09/29/23   Rana Snare, NP  predniSONE (DELTASONE) 10 MG tablet Take 1 tablet (10 mg total) by mouth daily with breakfast. Patient not taking: Reported on 10/04/2023 09/14/23   Rana Snare, NP     Vital Signs: BP (!) 110/50   Pulse 60   Temp 98.4 F (36.9 C) (Oral)   Resp (!) 8   Ht 5\' 8"  (1.727 m)   Wt 185 lb (83.9 kg)   SpO2 96%   BMI 28.13 kg/m   Physical Exam Vitals and nursing note reviewed.  Constitutional:      General: He is not in acute distress.    Appearance: He is not toxic-appearing.  HENT:  Mouth/Throat:     Mouth: Mucous membranes are moist.     Pharynx: Oropharynx is clear.  Cardiovascular:     Rate and Rhythm: Normal rate and regular rhythm.  Pulmonary:     Effort: Pulmonary effort is normal.     Breath sounds: Normal breath sounds.  Abdominal:     Palpations: Abdomen is soft.     Tenderness: There is no abdominal tenderness. There is no guarding or rebound.     Comments: GB drain appears well. More clear output noted in bag today than prior, no significant gallstone pieces observed today. Gauze dressing was slightly pulling on drain suture- changed by nursing at  bedside.  Musculoskeletal:     Right lower leg: No edema.     Left lower leg: No edema.  Skin:    General: Skin is warm and dry.  Neurological:     Mental Status: He is alert and oriented to person, place, and time. Mental status is at baseline.     Imaging: ECHOCARDIOGRAM COMPLETE Result Date: 10/06/2023    ECHOCARDIOGRAM REPORT   Patient Name:   Marcus [name] Date of Exam: 10/06/2023 Medical Rec #:  [id number]       Height:       68.0 in Accession #:    [id number]      Weight:       185.0 lb Date of Birth:  Jun 06, 1941       BSA:          1.977 m Patient Age:    82 years        BP:           124/45 mmHg Patient Gender: M               HR:           63 bpm. Exam Location:  Inpatient Procedure: 2D Echo, Color Doppler, Cardiac Doppler and Intracardiac            Opacification Agent (Both Spectral and Color Flow Doppler were            utilized during procedure). Indications:    Shock  History:        Patient has prior history of Echocardiogram examinations, most                 recent 12/14/2022. CHF, COPD, Arrythmias:AV Block; Risk                 Factors:Sleep Apnea, Former Smoker and Hypertension.  Sonographer:    Lamont Snowball Referring Phys: [id number] A CALDWELL POWELL JR  Sonographer Comments: Technically difficult study due to poor echo windows. IMPRESSIONS  1. Left ventricular ejection fraction, by estimation, is 35 to 40%. Left ventricular ejection fraction by 2D MOD biplane is 35.5 %. The left ventricle has moderately decreased function. The left ventricle demonstrates global hypokinesis. Left ventricular diastolic parameters are consistent with Grade I diastolic dysfunction (impaired relaxation).  2. Right ventricular systolic function is normal. The right ventricular size is normal.  3. Left atrial size was moderately dilated.  4. Right atrial size was moderately dilated.  5. The mitral valve is abnormal. Trivial mitral valve regurgitation.  6. The aortic valve is tricuspid. Aortic valve  regurgitation is not visualized. Aortic valve sclerosis/calcification is present, without any evidence of aortic stenosis. Aortic valve mean gradient measures 8.7 mmHg.  7. Aortic dilatation noted. There is mild dilatation of the ascending aorta, measuring 40 mm. Comparison(s): Changes from prior  study are noted. 12/14/2022: LVEF 45-50%. FINDINGS  Left Ventricle: No apical thrombus with Definity contrast. Left ventricular ejection fraction, by estimation, is 35 to 40%. Left ventricular ejection fraction by 2D MOD biplane is 35.5 %. The left ventricle has moderately decreased function. The left ventricle demonstrates global hypokinesis. Definity contrast agent was given IV to delineate the left ventricular endocardial borders. The left ventricular internal cavity size was normal in size. There is no left ventricular hypertrophy. Abnormal (paradoxical) septal motion, consistent with RV pacemaker. Left ventricular diastolic parameters are consistent with Grade I diastolic dysfunction (impaired relaxation). Indeterminate filling pressures. Right Ventricle: The right ventricular size is normal. No increase in right ventricular wall thickness. Right ventricular systolic function is normal. Left Atrium: Left atrial size was moderately dilated. Right Atrium: Right atrial size was moderately dilated. Pericardium: There is no evidence of pericardial effusion. Mitral Valve: The mitral valve is abnormal. There is mild calcification of the anterior and posterior mitral valve leaflet(s). Trivial mitral valve regurgitation. MV peak gradient, 5.2 mmHg. The mean mitral valve gradient is 2.0 mmHg. Tricuspid Valve: The tricuspid valve is grossly normal. Tricuspid valve regurgitation is trivial. Aortic Valve: The aortic valve is tricuspid. Aortic valve regurgitation is not visualized. Aortic valve sclerosis/calcification is present, without any evidence of aortic stenosis. Aortic valve mean gradient measures 8.7 mmHg. Aortic valve peak  gradient measures 15.5 mmHg. Aortic valve area, by VTI measures 2.12 cm. Pulmonic Valve: The pulmonic valve was normal in structure. Pulmonic valve regurgitation is not visualized. Aorta: Aortic dilatation noted. There is mild dilatation of the ascending aorta, measuring 40 mm. Venous: The inferior vena cava was not well visualized. IAS/Shunts: No atrial level shunt detected by color flow Doppler. Additional Comments: A device lead is visualized.  LEFT VENTRICLE PLAX 2D                        Biplane EF (MOD) LVIDd:         4.60 cm         LV Biplane EF:   Left LVIDs:         2.60 cm                          ventricular LV PW:         0.80 cm                          ejection LV IVS:        0.90 cm                          fraction by LVOT diam:     2.20 cm                          2D MOD LV SV:         81                               biplane is LV SV Index:   41                               35.5 %. LVOT Area:     3.80 cm  Diastology                                LV e' medial:    9.79 cm/s LV Volumes (MOD)               LV E/e' medial:  5.4 LV vol d, MOD    129.0 ml      LV e' lateral:   9.14 cm/s A2C:                           LV E/e' lateral: 5.8 LV vol d, MOD    131.0 ml A4C: LV vol s, MOD    68.3 ml A2C: LV vol s, MOD    96.7 ml A4C: LV SV MOD A2C:   60.7 ml LV SV MOD A4C:   131.0 ml LV SV MOD BP:    45.9 ml RIGHT VENTRICLE RV Basal diam:  4.00 cm RV S prime:     13.70 cm/s TAPSE (M-mode): 2.3 cm LEFT ATRIUM             Index        RIGHT ATRIUM           Index LA Vol (A2C):   99.7 ml 50.43 ml/m  RA Area:     25.70 cm LA Vol (A4C):   75.4 ml 38.14 ml/m  RA Volume:   88.80 ml  44.91 ml/m LA Biplane Vol: 89.1 ml 45.07 ml/m  AORTIC VALVE AV Area (Vmax):    2.20 cm AV Area (Vmean):   2.29 cm AV Area (VTI):     2.12 cm AV Vmax:           197.11 cm/s AV Vmean:          139.577 cm/s AV VTI:            0.383 m AV Peak Grad:      15.5 mmHg AV Mean Grad:      8.7 mmHg LVOT Vmax:          114.00 cm/s LVOT Vmean:        84.100 cm/s LVOT VTI:          0.214 m LVOT/AV VTI ratio: 0.56  AORTA Ao Root diam: 3.50 cm Ao Asc diam:  4.00 cm MITRAL VALVE MV Area (PHT): 1.60 cm     SHUNTS MV Area VTI:   2.58 cm     Systemic VTI:  0.21 m MV Peak grad:  5.2 mmHg     Systemic Diam: 2.20 cm MV Mean grad:  2.0 mmHg MV Vmax:       1.14 m/s MV Vmean:      59.9 cm/s MV Decel Time: 474 msec MV E velocity: 53.10 cm/s MV A velocity: 101.00 cm/s MV E/A ratio:  0.53 Zoila Shutter MD Electronically signed by Zoila Shutter MD Signature Date/Time: 10/06/2023/12:15:34 PM    Final    CT ABDOMEN PELVIS W CONTRAST Result Date: 10/05/2023 CLINICAL DATA:  Abdominal pain, acute, nonlocalized post cholecystostomy catheter placement procedure pain EXAM: CT ABDOMEN AND PELVIS WITH CONTRAST TECHNIQUE: Multidetector CT imaging of the abdomen and pelvis was performed using the standard protocol following bolus administration of intravenous contrast. RADIATION DOSE REDUCTION: This exam was performed according to the departmental dose-optimization program which includes automated exposure control, adjustment of the mA and/or kV according to patient size and/or use of iterative  reconstruction technique. CONTRAST:  75mL OMNIPAQUE IOHEXOL 300 MG/ML  SOLN COMPARISON:  10/03/2023 FINDINGS: Lower chest: Small pleural effusions right greater than left have developed. No pneumothorax. Patchy consolidation/atelectasis with air bronchograms posteriorly in the lung bases, new since previous. Hepatobiliary: Interval transhepatic pigtail cholecystostomy catheter placement, well position, with decompression of the gallbladder. Persistent gallbladder wall thickening in some regional inflammatory/edematous change. Metallic stent in the CBD stable, with pneumobilia implying patency of the stent. Stable scattered hepatic cysts, largest 1.6 cm in the posterior right lobe. Pancreas: Marked pancreatic ductal dilatation and parenchymal atrophy as before,  with poor delineation of the pancreatic head mass, nearly encircling the SMV and abutting the SMA. No new peripancreatic fluid collections. Spleen: Normal in size without focal abnormality. Adrenals/Urinary Tract: No adrenal lesion. 6 mm linear calcification in the right renal hilum probably atherosclerotic. No definite urolithiasis. No hydronephrosis or worrisome renal lesion. The urinary bladder is nondistended. Stomach/Bowel: Post gastroplasty. Patent stent to the second portion of the duodenum. Distal duodenum is physiologically distended. Small bowel is nondilated, with incomplete distal passage of the oral contrast material. Normal appendix. The colon is partially distended, without acute finding. Anastomotic staple line in the distal sigmoid segment. Vascular/Lymphatic: Moderate scattered aortoiliac calcified plaque without aneurysm. Narrowing of the distal superior mesenteric vein and proximal main portal vein; intrahepatic portal venous branches patent. No abdominal or pelvic adenopathy localized. Reproductive: Multiple metallic seeds in the prostate. Other: Small volume pelvic ascites, new since previous. No free air. Musculoskeletal: Post ventral hernia repair. Vertebral endplate spurring at multiple levels in the lower thoracic spine. Multilevel lumbar spondylitic change with fusion L3-L5. Chronic appearing sacral fracture deformity. IMPRESSION: 1. Interval cholecystostomy catheter placement, with decompression of the gallbladder, no hematoma or other apparent complication. 2. New small volume pelvic ascites. 3. New small pleural effusions right greater than left, with patchy consolidation/atelectasis posteriorly in the lung bases. 4. Stable pancreatic head mass with pancreatic ductal dilatation, parenchymal atrophy, encasement of the portosplenic confluence. Electronically Signed   By: Corlis Leak M.D.   On: 10/05/2023 15:01   DG CHEST PORT 1 VIEW Result Date: 10/04/2023 CLINICAL DATA:  Hypoxia EXAM:  PORTABLE CHEST 1 VIEW COMPARISON:  09/01/2023 FINDINGS: Left pacer remains in place, unchanged. Heart is normal size. Aortic atherosclerosis. Bilateral lower lobe airspace opacities, slightly increased since prior study. No effusions or acute bony abnormality. IMPRESSION: Bilateral lower lobe atelectasis or infiltrates. Electronically Signed   By: Charlett Nose M.D.   On: 10/04/2023 18:27   IR Perc Cholecystostomy Result Date: 10/04/2023 INDICATION: Acute cholecystitis. Prior cholecystostomy drain placed 09/03/2023, dislodged 09/22/2023. EXAM: ULTRASOUND AND FLUOROSCOPIC-GUIDED CHOLECYSTOSTOMY TUBE PLACEMENT COMPARISON:  US Abdomen, 10/03/2023.  NM HIDA, 10/04/2023. MEDICATIONS: The patient is currently admitted to the hospital and on intravenous antibiotics. Antibiotics were administered within an appropriate time frame prior to skin puncture. ANESTHESIA/SEDATION: Moderate (conscious) sedation was employed during this procedure. A total of Versed 2 mg and Fentanyl 100 mcg was administered intravenously. Moderate Sedation Time: 21 minutes. The patient's level of consciousness and vital signs were monitored continuously by radiology nursing throughout the procedure under my direct supervision. CONTRAST:  20mL OMNIPAQUE IOHEXOL 300 MG/ML SOLN - administered into the gallbladder fossa. FLUOROSCOPY TIME:  Fluoroscopic dose; 6 mGy COMPLICATIONS: None immediate. PROCEDURE: Informed written consent was obtained from the patient after a discussion of the risks, benefits and alternatives to treatment. Questions regarding the procedure were encouraged and answered. A timeout was performed prior to the initiation of the procedure. The right upper abdominal  quadrant was prepped and draped in the usual sterile fashion, and a sterile drape was applied covering the operative field. Maximum barrier sterile technique with sterile gowns and gloves were used for the procedure. A timeout was performed prior to the initiation of the  procedure. Local anesthesia was provided with 1% lidocaine with epinephrine. Ultrasound scanning of the RIGHT upper quadrant demonstrates a distended gallbladder. Utilizing a transhepatic approach, a 22 gauge needle was advanced into the gallbladder under direct ultrasound guidance. An ultrasound image was saved for documentation purposes. Appropriate intraluminal puncture was confirmed with the efflux of bile and advancement of an 0.018 wire into the gallbladder lumen. The needle was exchanged for an Accustick set. A small amount of contrast was injected to confirm appropriate intraluminal positioning. Over a Benson wire, a 10 Fr cholecystomy tube was advanced into the gallbladder fossa, coiled and locked. Bile was aspirated and a small amount of contrast was injected as several post procedural spot radiographic images were obtained in various obliquities. The catheter was secured to the skin with suture, connected to a drainage bag and a dressing was placed. The patient tolerated the procedure well without immediate post procedural complication. IMPRESSION: Successful ultrasound and fluoroscopic guided placement of a 10 Fr cholecystostomy tube. RECOMMENDATIONS: The patient will return to V[facility]iology ([facility]) for routine drainage catheter evaluation and exchange in 6-8 weeks. Roanna Banning, MD [facility] Electronically Signed   By: Roanna Banning M.D.   On: 10/04/2023 16:19   NM Hepatobiliary Liver Func Result Date: 10/04/2023 CLINICAL DATA:  Cholelithiasis and cholecystitis. Recent percutaneous cholecystostomy, which no longer in place. Internal biliary stent. Pancreatic carcinoma. EXAM: NUCLEAR MEDICINE HEPATOBILIARY IMAGING TECHNIQUE: Sequential images of the abdomen were obtained out to 60 minutes following intravenous administration of radiopharmaceutical. Because no gallbladder activity was visualized at this time, 3 mg morphine was  administered intravenously, and imaging was continued for another 30 minutes RADIOPHARMACEUTICALS:  5.3 mCi Tc-65m  Choletec IV COMPARISON:  None Available. FINDINGS: Prompt uptake and biliary excretion of activity by the liver is seen. Biliary activity promptly passes into small bowel, consistent with patent common bile duct. No gallbladder activity is visualized, either before or following administration of intravenous morphine, consistent with cystic duct obstruction. Reflux of biliary activity into the stomach is also noted. IMPRESSION: Absence of gallbladder activity, consistent with cystic duct obstruction. No evidence of biliary obstruction. Bile reflux noted. Electronically Signed   By: Danae Orleans M.D.   On: 10/04/2023 10:33   US Abdomen Limited RUQ (LIVER/GB) Result Date: 10/03/2023 CLINICAL DATA:  [id number] Thickening of wall of gallbladder [id number] Cholelithiasis [id number] EXAM: ULTRASOUND ABDOMEN LIMITED RIGHT UPPER QUADRANT COMPARISON:  US Abdomen, 01/11/2023. CT of pelvis, 10/03/2023 and 09/01/2023. FINDINGS: Suboptimal evaluation, with poor acoustic penetration secondary to patient habitus. Gallbladder: Dependent echogenic gallstone, within a nondistended gallbladder, measuring up to 2.3 cm. Gallbladder wall thickening, measuring 0.6 cm with mild pericholecystic fluid. No sonographic Murphy sign noted by sonographer. Common bile duct: Diameter: Poorly visualized. Liver: Increased hepatic parenchymal echogenicity. Ovoid, hypoechoic RIGHT liver lesion measuring 2.2 x 1.4 x 1.5 cm without internal vascularity likely consistent with a cysts. Portal vein is patent on color Doppler imaging with normal direction of blood flow towards the liver. Other: No perihepatic fluid. IMPRESSION: 1. Nondistended gallbladder with gallstone, relative wall thickening and pericholecystic fluid. Examination is equivocal for calculus cholecystitis, consider NM HIDA for further evaluation if continued clinical concern.  2. Echogenic liver. Findings most commonly  seen in hepatic steatosis, though may also represent hepatitis and/or fibrosis. Electronically Signed   By: Roanna Banning M.D.   On: 10/03/2023 14:54   CT ABDOMEN PELVIS W CONTRAST Result Date: 10/03/2023 CLINICAL DATA:  Abdominal pain, acute, nonlocalized pancreatic cancer, hx of bilary stent, pain worse in ruq Right lower quadrant abdominal pain for 3 days with nausea. Recently completed radiation therapy. EXAM: CT ABDOMEN AND PELVIS WITH CONTRAST TECHNIQUE: Multidetector CT imaging of the abdomen and pelvis was performed using the standard protocol following bolus administration of intravenous contrast. RADIATION DOSE REDUCTION: This exam was performed according to the departmental dose-optimization program which includes automated exposure control, adjustment of the mA and/or kV according to patient size and/or use of iterative reconstruction technique. CONTRAST:  OMNIPAQUE IOHEXOL 300 MG/ML  SOLN COMPARISON:  Abdominopelvic CT 09/01/2023 and 08/14/2023. Abdominal radiographs 09/06/2023. FINDINGS: Lower chest: Chronic elevation of the left hemidiaphragm with associated chronic bibasilar atelectasis. The overall aeration of the left lung base has improved from the prior study. No significant pleural or pericardial effusion. Patient has a pacemaker. There is aortic and coronary artery atherosclerosis. Hepatobiliary: Grossly stable hepatic cysts. No suspicious liver lesions are identified. Metallic biliary stent is unchanged in position with persistent pneumobilia. Small gallstones, gallbladder wall thickening and pericholecystic inflammation, similar to previous CT. Since the prior CT, the patient had a cholecystostomy tube placed and removed. Pancreas: Grossly stable ill-defined 2.2 cm mass involving the pancreatic head on image 29/4. Persistent moderate pancreatic ductal dilatation. No acute peripancreatic fluid collections are identified. Spleen: Normal in size  without focal abnormality. Adrenals/Urinary Tract: Both adrenal glands appear normal. No evidence of urinary tract calculus, suspicious renal lesion or hydronephrosis. Unchanged renal cysts for which no specific follow-up imaging is recommended. The bladder appears unremarkable for its degree of distention. Stomach/Bowel: No enteric contrast was administered. A duodenal stent is in place (placed 09/05/2023) and extending from the distal stomach into the 2nd portion of the duodenum, grossly unchanged from reference prior radiographs. There is fluid density within the stent lumen without soft tissue components to suggest tumor ingrowth. The stomach remains decompressed with stable postsurgical changes proximally. No significant bowel distension, wall thickening or surrounding inflammation. The appendix appears normal. There is a patent distal colonic anastomosis. Vascular/Lymphatic: No enlarged abdominopelvic lymph nodes are identified. Diffuse aortic and branch vessel atherosclerosis without evidence of aneurysm or acute large vessel occlusion. There is chronic venous occlusion at the portal splenic confluence. Reproductive: Prostate brachytherapy seeds. Other: Postsurgical changes in the anterior abdominal wall. Generalized subcutaneous and mesenteric edema has mildly progressed compared with the prior CT. No ascites, focal fluid collection or pneumoperitoneum demonstrated. Musculoskeletal: No acute or significant osseous findings. Multilevel spondylosis. IMPRESSION: 1. Cholelithiasis with gallbladder wall thickening and pericholecystic inflammation, similar to previous CT. Since the prior CT, the patient had a cholecystostomy tube placed and removed. Findings could reflect ongoing cholecystitis. 2. Interval duodenal stent placement with decompression of the stomach. 3. Grossly stable ill-defined pancreatic head mass with associated pancreatic ductal dilatation and chronic venous occlusion. No definite evidence of  metastatic disease. 4. Metallic biliary stent remains in place with persistent pneumobilia. 5. Generalized subcutaneous and mesenteric edema has mildly progressed compared with the prior CT. 6.  Aortic Atherosclerosis (ICD10-I70.0). Electronically Signed   By: Carey Bullocks M.D.   On: 10/03/2023 12:23    Labs:  CBC: Recent Labs    10/04/23 0350 10/05/23 0024 10/05/23 1617 10/06/23 0814 10/07/23 0828  WBC 2.5* 6.7  --  15.8*  4.9  HGB 8.7* 9.0* 8.9* 9.8* 8.7*  HCT 28.0* 28.7* 29.0* 30.3* 28.1*  PLT 118* 100*  --  173 112*    COAGS: Recent Labs    12/14/22 0037 01/09/23 1635 09/01/23 2120 09/02/23 1747 09/03/23 0404 09/04/23 0101 09/04/23 1041 09/05/23 0500 09/06/23 0340 10/04/23 0350  INR 1.4* 1.3*  --  1.4*  --   --   --   --   --  1.4*  APTT  --   --    < >  --    < > 90* 94* 99* 63*  --    < > = values in this interval not displayed.    BMP: Recent Labs    10/04/23 0350 10/05/23 0024 10/06/23 0814 10/07/23 0828  NA 137 136 131* 134*  K 3.1* 2.8* 3.3* 3.6  CL 106 106 103 104  CO2 23 21* 22 23  GLUCOSE 93 144* 147* 134*  BUN 22 29* 34* 33*  CALCIUM 8.3* 8.0* 7.9* 8.0*  CREATININE 1.17 1.45* 1.95* 1.54*  GFRNONAA >60 48* 34* 44*    LIVER FUNCTION TESTS: Recent Labs    09/14/23 0903 09/29/23 1412 10/03/23 0944 10/06/23 0814  BILITOT 0.4 0.3 0.6 0.4  AST 13* 15 21 20   ALT 9 10 14 14   ALKPHOS 62 51 54 51  PROT 6.2* 6.0* 6.0* 5.6*  ALBUMIN 3.3* 3.3* 2.9* 2.4*    Assessment and Plan:  Pt s/p cholecystostomy placement 10/04/23.   Continuing to do well since move into ICU for suspected septic shock. WBC 4.9 today from 15.8 yesterday.  Bile culture final today (4/4) Multiple bacterial morphotypes present, none predominant. Suggest appropriate recollection if clinically indicated   Drain appears well. Flushes well at bedside. Brief period of sharp pulling pain appears to likely be from the gauze dressing on the drain suture. Nurse changed dressing at  bedside- pt has had no recurrence of pain since Output 85ml over last day. Output appears more clear today than days prior.  pt scheduled for f/u drain exchange on 11/16/23   Current examination: Flushes/aspirates easily.  Insertion site unremarkable. Suture and stat lock in place. Dressed appropriately.   Plan: Continue TID flushes with 5 cc NS. Record output Q shift. Dressing changes QD or PRN if soiled.  Call IR APP or on call IR MD if difficulty flushing or sudden change in drain output.  Repeat imaging/possible drain injection once output < 10 mL/QD (excluding flush material). Consideration for drain removal if output is < 10 mL/QD (excluding flush material), pending discussion with the providing surgical service.  Discharge planning: Please contact IR APP or on call IR MD prior to patient d/c to ensure appropriate follow up plans are in place. Typically patient will follow up with IR clinic 10-14 days post d/c for repeat imaging/possible drain injection. IR scheduler will contact patient with date/time of appointment. Patient will need to flush drain QD with 5 cc NS, record output QD, dressing changes every 2-3 days or earlier if soiled.   IR will continue to follow - please call with questions or concerns.   Electronically Signed: Katheren Puller, PA-C 10/07/2023, 11:05 AM   I spent a total of 25 Minutes at the the patient's bedside AND on the patient's hospital floor or unit, greater than 50% of which was counseling/coordinating care for cholecystostomy drain.

## 2023-10-07 NOTE — Plan of Care (Signed)
   Problem: Activity: Goal: Risk for activity intolerance will decrease Outcome: Progressing   Problem: Nutrition: Goal: Adequate nutrition will be maintained Outcome: Progressing   Problem: Elimination: Goal: Will not experience complications related to bowel motility Outcome: Progressing   Problem: Skin Integrity: Goal: Risk for impaired skin integrity will decrease Outcome: Progressing

## 2023-10-07 NOTE — Plan of Care (Signed)

## 2023-10-07 NOTE — Progress Notes (Signed)
 Pharmacy Antibiotic Note  Marcus [name] is a 83 y.o. male admitted on 10/03/2023 with  intra-abdominal infection .  PMH significant for pancreatic cancer, HIDA scan w/ cystic duct obstruction s/p perc chole drain placement on 4/1.  Pharmacy has been consulted to transition piperacillin/tazobactam to cefepime/flagyl for improved Citrobacter coverage  Plan: Cefepime 2 gm IV q12h Flagyl 500 mg IV q12h Follow up renal function, WBC, temp, culture results, and clinical course.  Height: 5\' 8"  (172.7 cm) Weight: 83.9 kg (185 lb) IBW/kg (Calculated) : 68.4  Temp (24hrs), Avg:97.6 F (36.4 C), Min:97.3 F (36.3 C), Max:98.1 F (36.7 C)  Recent Labs  Lab 10/03/23 0944 10/03/23 1146 10/03/23 1448 10/04/23 0350 10/05/23 0024 10/05/23 1058 10/05/23 1423 10/05/23 1617 10/06/23 0814  WBC 2.9*  --   --  2.5* 6.7  --   --   --  15.8*  CREATININE 1.06  --   --  1.17 1.45*  --   --   --  1.95*  LATICACIDVEN  --  0.6 0.7  --   --  2.7* 2.3* 2.3*  --     Estimated Creatinine Clearance: 30.3 mL/min (A) (by C-G formula based on SCr of 1.95 mg/dL (H)).    No Known Allergies  Antimicrobials this admission: 3/31 Piperacillin/tazobactam  >> 4/4 4/4 cefepime>> 4/4 Flagyl>>   Micro: 4/1 MRSA: not detected 4/2 Bcx: ngtd 4/1 Bile: mod GNR, moderate C.freundii.  Holding for anaerobe.  4/2  BCx2 ngtd   Herby Abraham, Pharm.D Use secure chat for questions 10/07/2023 8:41 AM

## 2023-10-07 NOTE — Progress Notes (Signed)
 Still requiring pressors  Added 0.1 fludrocortisone

## 2023-10-08 DIAGNOSIS — K810 Acute cholecystitis: Secondary | ICD-10-CM | POA: Diagnosis not present

## 2023-10-08 DIAGNOSIS — N179 Acute kidney failure, unspecified: Secondary | ICD-10-CM | POA: Diagnosis not present

## 2023-10-08 DIAGNOSIS — R6521 Severe sepsis with septic shock: Secondary | ICD-10-CM | POA: Diagnosis not present

## 2023-10-08 DIAGNOSIS — A419 Sepsis, unspecified organism: Secondary | ICD-10-CM | POA: Diagnosis not present

## 2023-10-08 LAB — BASIC METABOLIC PANEL WITH GFR
Anion gap: 8 (ref 5–15)
BUN: 28 mg/dL — ABNORMAL HIGH (ref 8–23)
CO2: 23 mmol/L (ref 22–32)
Calcium: 8.2 mg/dL — ABNORMAL LOW (ref 8.9–10.3)
Chloride: 104 mmol/L (ref 98–111)
Creatinine, Ser: 1.32 mg/dL — ABNORMAL HIGH (ref 0.61–1.24)
GFR, Estimated: 54 mL/min — ABNORMAL LOW (ref >60)
Glucose, Bld: 110 mg/dL — ABNORMAL HIGH (ref 70–99)
Potassium: 3.7 mmol/L (ref 3.5–5.1)
Sodium: 135 mmol/L (ref 135–145)

## 2023-10-08 LAB — CBC WITH DIFFERENTIAL/PLATELET
Abs Immature Granulocytes: 0.04 10*3/uL (ref 0.00–0.07)
Basophils Absolute: 0 10*3/uL (ref 0.0–0.1)
Basophils Relative: 1 %
Eosinophils Absolute: 0.2 10*3/uL (ref 0.0–0.5)
Eosinophils Relative: 5 %
HCT: 27.3 % — ABNORMAL LOW (ref 39.0–52.0)
Hemoglobin: 8.7 g/dL — ABNORMAL LOW (ref 13.0–17.0)
Immature Granulocytes: 1 %
Lymphocytes Relative: 11 %
Lymphs Abs: 0.3 10*3/uL — ABNORMAL LOW (ref 0.7–4.0)
MCH: 27.4 pg (ref 26.0–34.0)
MCHC: 31.9 g/dL (ref 30.0–36.0)
MCV: 85.8 fL (ref 80.0–100.0)
Monocytes Absolute: 0.3 10*3/uL (ref 0.1–1.0)
Monocytes Relative: 10 %
Neutro Abs: 2.2 10*3/uL (ref 1.7–7.7)
Neutrophils Relative %: 72 %
Platelets: 114 10*3/uL — ABNORMAL LOW (ref 150–400)
RBC: 3.18 MIL/uL — ABNORMAL LOW (ref 4.22–5.81)
RDW: 18.5 % — ABNORMAL HIGH (ref 11.5–15.5)
WBC: 3 10*3/uL — ABNORMAL LOW (ref 4.0–10.5)
nRBC: 0 % (ref 0.0–0.2)

## 2023-10-08 LAB — MAGNESIUM: Magnesium: 2.2 mg/dL (ref 1.7–2.4)

## 2023-10-08 MED ORDER — MIDODRINE HCL 5 MG PO TABS
10.0000 mg | ORAL_TABLET | Freq: Three times a day (TID) | ORAL | Status: DC
  Administered 2023-10-08 – 2023-10-10 (×7): 10 mg via ORAL
  Filled 2023-10-08 (×8): qty 2

## 2023-10-08 NOTE — Plan of Care (Signed)

## 2023-10-08 NOTE — Plan of Care (Signed)
  Problem: Education: Goal: Knowledge of General Education information will improve Description: Including pain rating scale, medication(s)/side effects and non-pharmacologic comfort measures Outcome: Progressing   Problem: Clinical Measurements: Goal: Respiratory complications will improve Outcome: Progressing   Problem: Elimination: Goal: Will not experience complications related to bowel motility Outcome: Progressing Goal: Will not experience complications related to urinary retention Outcome: Progressing   Problem: Clinical Measurements: Goal: Cardiovascular complication will be avoided Outcome: Not Progressing

## 2023-10-08 NOTE — Progress Notes (Signed)
 NAME:  Marcus [name], MRN:  [id number], DOB:  12/23/1940, LOS: 5 ADMISSION DATE:  10/03/2023, CONSULTATION DATE:  10/05/23 REFERRING MD:  Dr. Lowell Guitar, CHIEF COMPLAINT:  hypotension   History of Present Illness:  50 yoM with PMH significant for but not limited to afib on eliquis, HTN, CAD, combined systolic and diastolic HF, AV block w/ PPM, OSA on CPAP, pancreatic cancer (dx 11/2022), and recent acute cholecystitis s/p cholecystostomy tube placement on 3/1 as not a surgery candidate and duodenal stent placement 2/2 gastric outlet obstruction.  Cholecystostomy tube removed on 3/1 after found to be  dislodged.  Presented back with recurrent abd pain and nausea without vomiting x 3 days, no fevers.  Pt neutropenicEmpiric zosyn started.  Underwent CT a/p, RUQ [place] and HIDA scan with findings of ongoing cholecystitis and recurrent cystic duct obstruction.  Underwent IR placement of percutaneous cholecystostomy tube.  Since pt has had progressive hypotension despite LR 2L bolus and started on peripheral norepinephrine.  Lactic this am  2.7.  New AKI and minimal UOP today. Repeat CT a/p pending.  PCCM consulted for further management.   Pt denies any specific complaints currently.  Had some upper abd pain worse with inspiration earlier but pain has been well managed.  Slight dizziness earlier now resolved, maybe slight headache.  No appetite.  Reports has barely ate anything since Sunday and noted 35lb wt loss since January of this year.   Pertinent  Medical History   Past Medical History:  Diagnosis Date   Acute meniscal tear of knee left   Arthritis    generalized   AV block 08/01/2018   Benign essential hypertension 01/18/2007   Bronchiectasis with acute exacerbation 06/23/2020   Chest pain, atypical 08/23/2014   Chronic cough 10/07/2014   Coronary artery disease    Degeneration of lumbar intervertebral disc 10/14/2017   Diverticulosis of colon 07/07/2005   Elevated PSA 06/19/2014   GERD  (gastroesophageal reflux disease) 01/18/2007   Gout, unspecified 01/18/2007   Hemorrhoids 01/19/2011   Hiatal hernia    History of colonic polyps 01/18/2007   Insomnia, unspecified 08/21/2007   Iron deficiency anemia, unspecified  01/19/2011   Lumbar post-laminectomy syndrome 10/14/2017   Lumbar spondylolysis    Lumbar strain 12/14/2011   Obstructive sleep apnea 01/18/2007   uses CPAP nightly   Paraesophageal hernia    large   Primary osteoarthritis of knee 05/23/2015   Prostate cancer (HCC) 2016   S/P knee replacement 05/23/2015   Sensorineural hearing loss, bilateral    Vitamin B12 deficiency    Vitamin D deficiency 10/28/2009   Significant Hospital Events: Including procedures, antibiotic start and stop dates in addition to other pertinent events   3/31 admit 4/2 PCCM consult, vasopressor support 4/3 weaning vasopressors 4/4-was able to wean off IV pressors.  Remains on midodrine   Antibiotics: Cefepime 4/5>> Metronidazole 4/5>> Zosyn 3/31-4/3.  Interim History / Subjective:  Still having some abdominal pain Off norepinephrine, on midodrine, Florinef  Objective   Blood pressure (!) 96/49, pulse 61, temperature 97.6 F (36.4 C), temperature source Oral, resp. rate 17, height 5\' 8"  (1.727 m), weight 83.9 kg, SpO2 95%.        Intake/Output Summary (Last 24 hours) at 10/08/2023 0832 Last data filed at 10/08/2023 0600 Gross per 24 hour  Intake 752.21 ml  Output 715 ml  Net 37.21 ml   Filed Weights   10/03/23 0920  Weight: 83.9 kg    Examination: General: Elderly, frail  HEENT: Moist oral  mucosa Neuro: Alert and oriented, moving all extremities CV: Paced rhythm, S1-S2 appreciated PULM: Clear breath sounds GI: Right upper quadrant biliary drain-clear serous drainage Extremities: Warm and dry Skin: no rashes   Biliary tube > 165 ml/ 24hrs  I reviewed nursing notes, Consultant notes, last 24 h vitals and pain scores, last 48 h intake and output, last 24 h  labs and trends, and last 24 h imaging results. -BUN/creatinine is stable -Anemia is stable -Platelets stable  Resolved Hospital Problem list   Lactic acidosis  Assessment & Plan:   Septic shock secondary to acute cholecystitis and cystic duct obstruction s/p IR Perc cholecystectomy tube placement 4/1 Possible right lower lobe pneumonia -CT abdomen 4/1 does show interval cholecystostomy catheter placement with decompression of the gallbladder, no other significant finding.  Some atelectasis, air bronchograms at lower lobe.  Pancreatic mass -Biliary culture-polymicrobial.  Antibiotics switched to cefepime metronidazole -Blood cultures are showing no growth to date -Has been able to wean off IV pressors -Remains on midodrine-dose of midodrine cut down to 10, 3 times daily -Remains afebrile, blood counts stable -Encourage pulmonary hygiene  Abdominal pain and discomfort -Optimize pain management  Acute kidney injury -Stable parameters -Maintain renal perfusion -Avoid nephrotoxic medications  History of BPH -Remains on Flomax/Ditropan  Hypertension Heart failure with reduced ejection fraction -Antihypertensives on hold -Was on Norvasc, valsartan-hydrochlorothiazide  Thrombocytopenia -Related to sepsis Normocytic anemia -Continue to trend  Atrial fibrillation -Eliquis on hold from 3/30 -He is AV paced  Pancreatic adenocarcinoma -Followed by Dr. Myrle Sheng As of 3/27 patient has decided to hold on further radiation and Xeloda treatments He is DNR DNI  Failure to thrive Protein calorie malnutrition Has had about 35 pound weight loss since January -Appears to be doing a little bit better with p.o. intake  Still requires antibiotics Will need IR plans for drain management Hopefully can wean him down on blood pressure support medications as he is normally hypertensive Optimize pain management   Best Practice (right click and "Reselect all SmartList Selections" daily)    Diet/type: Regular consistency (see orders) DVT prophylaxis LMWH Pressure ulcer(s): N/A GI prophylaxis: N/A Lines: N/A- port right chest accessed Foley:  N/A Code Status:  DNR/ DNI Last date of multidisciplinary goals of care discussion [ongoing]   Labs   CBC: Recent Labs  Lab 10/04/23 0350 10/05/23 0024 10/05/23 1617 10/06/23 0814 10/07/23 0828 10/08/23 0413  WBC 2.5* 6.7  --  15.8* 4.9 3.0*  NEUTROABS  --  6.1  --  13.4* 3.9 2.2  HGB 8.7* 9.0* 8.9* 9.8* 8.7* 8.7*  HCT 28.0* 28.7* 29.0* 30.3* 28.1* 27.3*  MCV 88.9 87.0  --  85.8 87.0 85.8  PLT 118* 100*  --  173 112* 114*    Basic Metabolic Panel: Recent Labs  Lab 10/04/23 0350 10/05/23 0024 10/06/23 0814 10/07/23 0828 10/08/23 0413  NA 137 136 131* 134* 135  K 3.1* 2.8* 3.3* 3.6 3.7  CL 106 106 103 104 104  CO2 23 21* 22 23 23   GLUCOSE 93 144* 147* 134* 110*  BUN 22 29* 34* 33* 28*  CREATININE 1.17 1.45* 1.95* 1.54* 1.32*  CALCIUM 8.3* 8.0* 7.9* 8.0* 8.2*  MG  --  1.8 2.0 2.2 2.2   GFR: Estimated Creatinine Clearance: 44.7 mL/min (A) (by C-G formula based on SCr of 1.32 mg/dL (H)). Recent Labs  Lab 10/03/23 1448 10/04/23 0350 10/05/23 0024 10/05/23 1058 10/05/23 1423 10/05/23 1617 10/06/23 0814 10/07/23 0828 10/08/23 0413  WBC  --    < >  6.7  --   --   --  15.8* 4.9 3.0*  LATICACIDVEN 0.7  --   --  2.7* 2.3* 2.3*  --   --   --    < > = values in this interval not displayed.    Liver Function Tests: Recent Labs  Lab 10/03/23 0944 10/06/23 0814  AST 21 20  ALT 14 14  ALKPHOS 54 51  BILITOT 0.6 0.4  PROT 6.0* 5.6*  ALBUMIN 2.9* 2.4*   Recent Labs  Lab 10/03/23 0944  LIPASE 23   No results for input(s): "AMMONIA" in the last 168 hours.  ABG    Component Value Date/Time   PHART 7.4 10/04/2023 1733   PCO2ART 36 10/04/2023 1733   PO2ART 188 (H) 10/04/2023 1733   HCO3 22.3 10/04/2023 1733   TCO2 26 09/01/2023 1454   ACIDBASEDEF 2.0 10/04/2023 1733   O2SAT 100 10/04/2023 1733      Coagulation Profile: Recent Labs  Lab 10/04/23 0350  INR 1.4*    Cardiac Enzymes: No results for input(s): "CKTOTAL", "CKMB", "CKMBINDEX", "TROPONINI" in the last 168 hours.  HbA1C: Hgb A1c MFr Bld  Date/Time Value Ref Range Status  11/24/2022 03:27 PM 6.3 4.6 - 6.5 % Final    Comment:    Glycemic Control Guidelines for People with Diabetes:Non Diabetic:  <6%Goal of Therapy: <7%Additional Action Suggested:  >8%   03/04/2017 08:08 AM 6.0 4.6 - 6.5 % Final    Comment:    Glycemic Control Guidelines for People with Diabetes:Non Diabetic:  <6%Goal of Therapy: <7%Additional Action Suggested:  >8%     CBG: Recent Labs  Lab 10/04/23 1755 10/05/23 1126  GLUCAP 88 225*    Allergies No Known Allergies   Home Medications  Prior to Admission medications   Medication Sig Start Date End Date Taking? Authorizing Provider  acetaminophen (TYLENOL) 325 MG tablet Take 2 tablets (650 mg total) by mouth every 6 (six) hours as needed for mild pain (or Fever >/= 101). Patient taking differently: Take 650 mg by mouth every 6 (six) hours as needed for mild pain (pain score 1-3) or moderate pain (pain score 4-6) (or Fever >/= 101). 01/13/23  Yes Sheikh, Omair Latif, DO  apixaban (ELIQUIS) 5 MG TABS tablet Take 1 tablet (5 mg total) by mouth 2 (two) times daily. 02/23/23 10/04/23 Yes Nafziger, Kandee Keen, NP  cyanocobalamin (VITAMIN B12) 500 MCG tablet Take 1 tablet by mouth 2 (two) times daily. 07/26/23  Yes [provider]  eszopiclone 3 MG TABS Take 1 tablet (3 mg total) by mouth at bedtime. Take immediately before bedtime. Patient taking differently: Take 3 mg by mouth at bedtime as needed (sleep). 09/09/23  Yes Nafziger, Kandee Keen, NP  oxybutynin (DITROPAN) 5 MG tablet Take 5 mg by mouth 2 (two) times daily as needed for bladder spasms (urinary incontinence).   Yes [provider]  oxyCODONE (ROXICODONE) 5 MG immediate release tablet Take 1 tablet (5 mg total) by mouth every 6 (six) hours as  needed for severe pain (pain score 7-10). 09/09/23  Yes Nafziger, Kandee Keen, NP  tamsulosin (FLOMAX) 0.4 MG CAPS capsule Take 1 capsule by mouth daily. 06/21/23  Yes [provider]  valsartan-hydrochlorothiazide (DIOVAN-HCT) 160-25 MG tablet Take 1 tablet by mouth daily.   Yes [provider]  allopurinol (ZYLOPRIM) 300 MG tablet Take 300 mg by mouth daily. Patient not taking: Reported on 10/04/2023    [provider]  amLODipine (NORVASC) 10 MG tablet Take 10 mg by mouth  daily. Patient not taking: Reported on 10/04/2023    [provider]  capecitabine (XELODA) 500 MG tablet Take 3 tablets (1,500 mg total) by mouth 2 (two) times daily after a meal. Take Monday-Friday. Take only days of radiation. Patient not taking: Reported on 10/04/2023 08/25/23   [name], MD  LORazepam (ATIVAN) 0.5 MG tablet Take 1 tablet (0.5 mg total) by mouth every 8 (eight) hours as needed for anxiety. Patient not taking: Reported on 10/04/2023 09/29/23   Rana Snare, NP  predniSONE (DELTASONE) 10 MG tablet Take 1 tablet (10 mg total) by mouth daily with breakfast. Patient not taking: Reported on 10/04/2023 09/14/23   Rana Snare, NP    The patient is critically ill with multiple organ systems failure and requires high complexity decision making for assessment and support, frequent evaluation and titration of therapies, application of advanced monitoring technologies and extensive interpretation of multiple databases. Critical Care Time devoted to patient care services described in this note independent of APP/resident time (if applicable)  is 30 minutes.   Virl Diamond MD [facility] Personal pager: See Amion If unanswered, please page CCM On-call: #[phone]

## 2023-10-09 DIAGNOSIS — K810 Acute cholecystitis: Secondary | ICD-10-CM | POA: Diagnosis not present

## 2023-10-09 LAB — MAGNESIUM: Magnesium: 2.3 mg/dL (ref 1.7–2.4)

## 2023-10-09 LAB — CBC WITH DIFFERENTIAL/PLATELET
Abs Immature Granulocytes: 0.02 10*3/uL (ref 0.00–0.07)
Basophils Absolute: 0 10*3/uL (ref 0.0–0.1)
Basophils Relative: 0 %
Eosinophils Absolute: 0.1 10*3/uL (ref 0.0–0.5)
Eosinophils Relative: 6 %
HCT: 26.3 % — ABNORMAL LOW (ref 39.0–52.0)
Hemoglobin: 8.3 g/dL — ABNORMAL LOW (ref 13.0–17.0)
Immature Granulocytes: 1 %
Lymphocytes Relative: 15 %
Lymphs Abs: 0.3 10*3/uL — ABNORMAL LOW (ref 0.7–4.0)
MCH: 27 pg (ref 26.0–34.0)
MCHC: 31.6 g/dL (ref 30.0–36.0)
MCV: 85.7 fL (ref 80.0–100.0)
Monocytes Absolute: 0.3 10*3/uL (ref 0.1–1.0)
Monocytes Relative: 14 %
Neutro Abs: 1.2 10*3/uL — ABNORMAL LOW (ref 1.7–7.7)
Neutrophils Relative %: 64 %
Platelets: 100 10*3/uL — ABNORMAL LOW (ref 150–400)
RBC: 3.07 MIL/uL — ABNORMAL LOW (ref 4.22–5.81)
RDW: 18.7 % — ABNORMAL HIGH (ref 11.5–15.5)
WBC: 1.8 10*3/uL — ABNORMAL LOW (ref 4.0–10.5)
nRBC: 0 % (ref 0.0–0.2)

## 2023-10-09 LAB — BASIC METABOLIC PANEL WITH GFR
Anion gap: 4 — ABNORMAL LOW (ref 5–15)
BUN: 25 mg/dL — ABNORMAL HIGH (ref 8–23)
CO2: 23 mmol/L (ref 22–32)
Calcium: 8 mg/dL — ABNORMAL LOW (ref 8.9–10.3)
Chloride: 110 mmol/L (ref 98–111)
Creatinine, Ser: 1.19 mg/dL (ref 0.61–1.24)
GFR, Estimated: >60 mL/min (ref >60)
Glucose, Bld: 101 mg/dL — ABNORMAL HIGH (ref 70–99)
Potassium: 3.7 mmol/L (ref 3.5–5.1)
Sodium: 137 mmol/L (ref 135–145)

## 2023-10-09 LAB — PHOSPHORUS: Phosphorus: 2.9 mg/dL (ref 2.5–4.6)

## 2023-10-09 NOTE — Plan of Care (Signed)
  Problem: Education: Goal: Knowledge of General Education information will improve Description: Including pain rating scale, medication(s)/side effects and non-pharmacologic comfort measures Outcome: Progressing   Problem: Clinical Measurements: Goal: Respiratory complications will improve Outcome: Progressing Goal: Cardiovascular complication will be avoided Outcome: Progressing   Problem: Coping: Goal: Level of anxiety will decrease Outcome: Progressing   Problem: Elimination: Goal: Will not experience complications related to urinary retention Outcome: Progressing   Problem: Pain Managment: Goal: General experience of comfort will improve and/or be controlled Outcome: Not Progressing

## 2023-10-09 NOTE — Progress Notes (Signed)
 PROGRESS NOTE KHIEM GARGIS  ZOX:[id number] DOB: 18-Apr-1941 DOA: 10/03/2023 PCP: [name], NP  Brief Narrative/Hospital Course: 83 yo male with PMH pancreatic cancer, A-fib, HTN, CAD, gout, OSA, prostate cancer who presented with recurrent abdominal pain who had recently been diagnosed with acute cholecystitis and underwent cholecystostomy tube placement on 09/03/2023, recent duodenal stent placed during that hospitalization due to gastric outlet obstruction and on 09/22/2023 his cholecystostomy tube was removed by radiology after found to be displaced who presented with recurrent symptoms-abdominal pain nausea vomiting x 3 days no fever. CT a/p, RUQ [place] and HIDA scan with findings of ongoing cholecystitis and recurrent cystic duct obstruction S/p IR placement of percutaneous cholecystostomy tube and patient with progression to hypotension, PCCM consulted 4/2,> transferred to ICCU for pressors.  Patient had been weaned off pressors to midodrine.  Culture drain polymicrobial-being managed with IV antibiotics, IR following for drain. 10/09/23> transferred to [facility]  Subjective: Seen this am No BM last a week ago, diarrhea on 4/4, held miralax and got this am passing has Still having pain on RUQ Overnight BP stable afebrile on room air Labs reviewed AKI resolved creatinine 1.1, CBC with leukopenia 1.8 hemoglobin 8.7>8.3, plt 114>100.   Assessment and plan:  Septic shock due to acute cholecystitis/cystic duct obstruction S/p IR Perc cholecystectomy tube placement 4/1 Possible RLL pneumonia CT abd 4/1-interval cholecystostomy catheter placement with decompression of the gallbladder, no other significant finding.  Some atelectasis, air bronchograms at lower lobe and pancreatic mass. Weaned off pressors-BP stable continue midodrine 10mg  tid and cont to wean as able. Blood culture NGTD. Biliary culture-polymicrobial- cont Cefepime/metronidazole 4/5>>  Abdominal pain: Likely in the setting of  pancreatic malignancy versus cholecystitis. Continue pain management  AKI Metabolic acidosis: Resolved. Peaked to 1.9. Continue to encourage oral hydration. Recent Labs  Lab 10/05/23 0024 10/06/23 0814 10/07/23 0828 10/08/23 0413 10/09/23 0356  BUN 29* 34* 33* 28* 25*  CREATININE 1.45* 1.95* 1.54* 1.32* 1.19    BPH Cont flomax/Ditropan   Hypertension CHFrEF: PTA on amlodipine/valsartan hydrochlorothiazide- antihypertensive on hold. Echo 4/30 shows EF 35 40%  ( previous 45-50% in 12/14/22), G1 DD GHK RV SF normal and RV size normal LA moderately dilated-  Leukopenia likely from sepsis Thrombocytopenia due to sepsis: Anemia of malignancy Continue to monitor cbc, transfuse for hemoglobin less than 7 g.  ?  On long-term prednisone:  Patient reported not taking it we will discuss due to [facility] tomorrow abt this- he prescribed that back in March 2  continue PT OT. ?Long-term prednisone he is not sure-was palced by Dr Truett Perna- will d/w Dr Alcide Evener.  Atrial fibrillation: Currently AV paced.  Eliquis remains on hold since 3/30.   Pancreatic adenocarcinoma Goals of care discussion: Followed by Dr. Myrle Sheng. As of 3/27 patient has decided to hold on further radiation and Xeloda treatments He is DNR DNI  Failure to thrive Severe protein calorie malnutrition: Augment diet, has lost about 35 pounds since January.  Dietitian consulted    DVT prophylaxis: enoxaparin (LOVENOX) injection 40 mg Start: 10/03/23 2200 Code Status:   Code Status: Limited: Do not attempt resuscitation (DNR) -DNR-LIMITED -Do Not Intubate/DNI  Family Communication: plan of care discussed with patient/daughter at bedside. Patient status is: Remains hospitalized because of severity of illness Level of care: Telemetry   Dispo: The patient is from: Home            Anticipated disposition: TBD  Objective: Vitals last 24 hrs: Vitals:   10/09/23 0700 10/09/23 0725 10/09/23 0800 10/09/23  0900  BP: 122/62  (!)  129/59 (!) 122/55  Pulse: (!) 58  61 70  Resp: 13  16 (!) 21  Temp:  98.1 F (36.7 C)    TempSrc:  Oral    SpO2: 91%  94% 94%  Weight:      Height:       Weight change:   Physical Examination: General exam: alert awake, older than stated age HEENT:Oral mucosa moist, Ear/Nose WNL grossly Respiratory system: Bilaterally diminished BS, no use of accessory muscle Cardiovascular system: S1 & S2 +. Gastrointestinal system: Abdomen soft, tender right upper quadrant, drain in place  Nervous System: Alert, awake,following commands. Extremities: LE edema neg, moving arms and leg, warm legs Skin: No rashes,warm. MSK: Normal muscle bulk/tone.   Medications reviewed:  Scheduled Meds:  Chlorhexidine Gluconate Cloth  6 each Topical Daily   enoxaparin (LOVENOX) injection  40 mg Subcutaneous Q24H   feeding supplement  237 mL Oral BID BM   midodrine  10 mg Oral TID WC   polyethylene glycol  17 g Oral Daily   sodium chloride flush  10-40 mL Intracatheter Q12H   sodium chloride flush  5 mL Intracatheter Q8H   tamsulosin  0.4 mg Oral Daily   Continuous Infusions:  ceFEPime (MAXIPIME) IV Stopped (10/09/23 1013)   metronidazole Stopped (10/09/23 0900)    Diet Order             Diet regular Room service appropriate? Yes; Fluid consistency: Thin  Diet effective now                    Intake/Output Summary (Last 24 hours) at 10/09/2023 1203 Last data filed at 10/09/2023 1112 Gross per 24 hour  Intake 509.99 ml  Output 1095 ml  Net -585.01 ml   Net IO Since Admission: 2,769.39 mL [10/09/23 1203]  Wt Readings from Last 3 Encounters:  10/03/23 83.9 kg  09/29/23 89 kg  09/14/23 92.8 kg     Unresulted Labs (From admission, onward)     Start     Ordered   10/06/23 0500  Basic metabolic panel with GFR  Daily,   R     Question:  Specimen collection method  Answer:  IV Team=IV Team collect   10/05/23 0021   10/06/23 0500  CBC with Differential/Platelet  Daily,   R     Question:   Specimen collection method  Answer:  IV Team=IV Team collect   10/05/23 0021   10/06/23 0500  Magnesium  Daily,   R     Question:  Specimen collection method  Answer:  IV Team=IV Team collect   10/05/23 0021          Data Reviewed: I have personally reviewed following labs and imaging studies ( see epic result tab) CBC: Recent Labs  Lab 10/05/23 0024 10/05/23 1617 10/06/23 0814 10/07/23 0828 10/08/23 0413 10/09/23 0356  WBC 6.7  --  15.8* 4.9 3.0* 1.8*  NEUTROABS 6.1  --  13.4* 3.9 2.2 1.2*  HGB 9.0* 8.9* 9.8* 8.7* 8.7* 8.3*  HCT 28.7* 29.0* 30.3* 28.1* 27.3* 26.3*  MCV 87.0  --  85.8 87.0 85.8 85.7  PLT 100*  --  173 112* 114* 100*   CMP: Recent Labs  Lab 10/05/23 0024 10/06/23 0814 10/07/23 0828 10/08/23 0413 10/09/23 0356  NA 136 131* 134* 135 137  K 2.8* 3.3* 3.6 3.7 3.7  CL 106 103 104 104 110  CO2 21* 22 23 23 23   GLUCOSE  144* 147* 134* 110* 101*  BUN 29* 34* 33* 28* 25*  CREATININE 1.45* 1.95* 1.54* 1.32* 1.19  CALCIUM 8.0* 7.9* 8.0* 8.2* 8.0*  MG 1.8 2.0 2.2 2.2 2.3  PHOS  --   --   --   --  2.9   GFR: Estimated Creatinine Clearance: 49.6 mL/min (by C-G formula based on SCr of 1.19 mg/dL). Recent Labs  Lab 10/03/23 0944 10/06/23 0814  AST 21 20  ALT 14 14  ALKPHOS 54 51  BILITOT 0.6 0.4  PROT 6.0* 5.6*  ALBUMIN 2.9* 2.4*   Recent Labs  Lab 10/03/23 0944  LIPASE 23   No results for input(s): "AMMONIA" in the last 168 hours. Coagulation Profile:  Recent Labs  Lab 10/04/23 0350  INR 1.4*   No results for input(s): "PROBNP" in the last 168 hours.  No results for input(s): "HGBA1C" in the last 72 hours. Recent Labs  Lab 10/04/23 1755 10/05/23 1126  GLUCAP 88 225*   No results for input(s): "CHOL", "HDL", "LDLCALC", "TRIG", "CHOLHDL", "LDLDIRECT" in the last 72 hours. No results for input(s): "TSH", "T4TOTAL", "FREET4", "T3FREE", "THYROIDAB" in the last 72 hours. Sepsis Labs: Recent Labs  Lab 10/03/23 1448 10/05/23 1058  10/05/23 1423 10/05/23 1617  LATICACIDVEN 0.7 2.7* 2.3* 2.3*   Recent Results (from the past 240 hours)  Aerobic/Anaerobic Culture w Gram Stain (surgical/deep wound)     Status: None   Collection Time: 10/04/23  3:04 PM   Specimen: BILE  Result Value Ref Range Status   Specimen Description   Final    BILE Performed at [facility], [address] [place], [place] [address]    Special Requests   Final    NONE Performed at [facility], [address] [place], [place] [address]    Gram Stain   Final    FEW WBC PRESENT,BOTH PMN AND MONONUCLEAR ABUNDANT GRAM POSITIVE COCCI MODERATE GRAM NEGATIVE RODS RARE GRAM POSITIVE RODS    Culture   Final    Multiple bacterial morphotypes present, none predominant. Suggest appropriate recollection if clinically indicated. MIXED ANAEROBIC FLORA PRESENT.  CALL LAB IF FURTHER IID REQUIRED. NO STAPHYLOCOCCUS AUREUS ISOLATED NO GROUP A STREP (S.PYOGENES) ISOLATED Performed at [facility], [address], [place], [place] [address]    Report Status 10/07/2023 FINAL  Final  MRSA Next Gen by PCR, Nasal     Status: None   Collection Time: 10/04/23  6:31 PM   Specimen: Nasal Mucosa; Nasal Swab  Result Value Ref Range Status   MRSA by PCR Next Gen NOT DETECTED NOT DETECTED Final    Comment: (NOTE) The GeneXpert MRSA Assay (FDA approved for NASAL specimens only), is one component of a comprehensive MRSA colonization surveillance program. It is not intended to diagnose MRSA infection nor to guide or monitor treatment for MRSA infections. Test performance is not FDA approved in patients less than 56 years old. Performed at [facility], [address], [place], [place] [address]   Culture, blood (Routine X 2) w Reflex to ID Panel     Status: None (Preliminary result)   Collection Time: 10/05/23  4:17 PM   Specimen: BLOOD LEFT ARM  Result Value Ref Range Status   Specimen Description    Final    BLOOD LEFT ARM Performed at [facility], [address], [place], [place] [address]    Special Requests   Final    BOTTLES DRAWN AEROBIC AND ANAEROBIC  Blood Culture adequate volume Performed at [facility], [address], [place], [place] [address]    Culture   Final    NO GROWTH 4 DAYS Performed at [facility], [address], [place], [place] [address]    Report Status PENDING  Incomplete  Culture, blood (Routine X 2) w Reflex to ID Panel     Status: None (Preliminary result)   Collection Time: 10/05/23  4:22 PM   Specimen: BLOOD LEFT ARM  Result Value Ref Range Status   Specimen Description   Final    BLOOD LEFT ARM Performed at [facility], [address], [place], [place] [address]    Special Requests   Final    BOTTLES DRAWN AEROBIC AND ANAEROBIC Blood Culture results may not be optimal due to an inadequate volume of blood received in culture bottles Performed at [facility], [address], [place], [place] [address]    Culture   Final    NO GROWTH 4 DAYS Performed at [facility], [address], [place], [place] [address]    Report Status PENDING  Incomplete   Antimicrobials/Microbiology: Anti-infectives (From admission, onward)    Start     Dose/Rate Route Frequency Ordered Stop   10/07/23 1000  ceFEPIme (MAXIPIME) 2 g in sodium chloride 0.9 % 100 mL IVPB        2 g 200 mL/hr over 30 Minutes Intravenous Every 12 hours 10/07/23 0835     10/07/23 1000  metroNIDAZOLE (FLAGYL) IVPB 500 mg        500 mg 100 mL/hr over 60 Minutes Intravenous Every 12 hours 10/07/23 0835     10/03/23 2000  piperacillin-tazobactam (ZOSYN) IVPB 3.375 g  Status:  Discontinued        3.375 g 12.5 mL/hr over 240 Minutes Intravenous Every 8 hours 10/03/23 1415 10/07/23 0833   10/03/23 1245  piperacillin-tazobactam (ZOSYN) IVPB 3.375 g        3.375 g 100 mL/hr over 30 Minutes Intravenous  Once 10/03/23 1241  10/03/23 1326         Component Value Date/Time   SDES  10/05/2023 1622    BLOOD LEFT ARM Performed at [facility], [address] [place], [place] [address]    SPECREQUEST  10/05/2023 1622    BOTTLES DRAWN AEROBIC AND ANAEROBIC Blood Culture results may not be optimal due to an inadequate volume of blood received in culture bottles Performed at [facility], [address] [place], [place] [address]    CULT  10/05/2023 1622    NO GROWTH 4 DAYS Performed at [facility], [address] [place], [place] [address]    REPTSTATUS PENDING 10/05/2023 1622     Radiology Studies: No results found.   LOS: 6 days   Total time spent in review of labs and imaging, patient evaluation, formulation of plan, documentation and communication with patient/family: 50 minutes  Lanae Boast, MD [facility] 10/09/2023, 12:03 PM

## 2023-10-10 ENCOUNTER — Ambulatory Visit: Payer: Non-veteran care

## 2023-10-10 DIAGNOSIS — E43 Unspecified severe protein-calorie malnutrition: Secondary | ICD-10-CM | POA: Insufficient documentation

## 2023-10-10 DIAGNOSIS — K810 Acute cholecystitis: Secondary | ICD-10-CM | POA: Diagnosis not present

## 2023-10-10 LAB — CULTURE, BLOOD (ROUTINE X 2)
Culture: NO GROWTH
Culture: NO GROWTH
Special Requests: ADEQUATE

## 2023-10-10 LAB — CBC WITH DIFFERENTIAL/PLATELET
Abs Immature Granulocytes: 0.08 10*3/uL — ABNORMAL HIGH (ref 0.00–0.07)
Basophils Absolute: 0 10*3/uL (ref 0.0–0.1)
Basophils Relative: 1 %
Eosinophils Absolute: 0.2 10*3/uL (ref 0.0–0.5)
Eosinophils Relative: 7 %
HCT: 27.6 % — ABNORMAL LOW (ref 39.0–52.0)
Hemoglobin: 8.5 g/dL — ABNORMAL LOW (ref 13.0–17.0)
Immature Granulocytes: 4 %
Lymphocytes Relative: 15 %
Lymphs Abs: 0.3 10*3/uL — ABNORMAL LOW (ref 0.7–4.0)
MCH: 26.8 pg (ref 26.0–34.0)
MCHC: 30.8 g/dL (ref 30.0–36.0)
MCV: 87.1 fL (ref 80.0–100.0)
Monocytes Absolute: 0.3 10*3/uL (ref 0.1–1.0)
Monocytes Relative: 15 %
Neutro Abs: 1.3 10*3/uL — ABNORMAL LOW (ref 1.7–7.7)
Neutrophils Relative %: 58 %
Platelets: 113 10*3/uL — ABNORMAL LOW (ref 150–400)
RBC: 3.17 MIL/uL — ABNORMAL LOW (ref 4.22–5.81)
RDW: 18.6 % — ABNORMAL HIGH (ref 11.5–15.5)
WBC: 2.3 10*3/uL — ABNORMAL LOW (ref 4.0–10.5)
nRBC: 0 % (ref 0.0–0.2)

## 2023-10-10 LAB — BASIC METABOLIC PANEL WITH GFR
Anion gap: 7 (ref 5–15)
BUN: 21 mg/dL (ref 8–23)
CO2: 23 mmol/L (ref 22–32)
Calcium: 8.2 mg/dL — ABNORMAL LOW (ref 8.9–10.3)
Chloride: 108 mmol/L (ref 98–111)
Creatinine, Ser: 1.18 mg/dL (ref 0.61–1.24)
GFR, Estimated: >60 mL/min (ref >60)
Glucose, Bld: 99 mg/dL (ref 70–99)
Potassium: 3.7 mmol/L (ref 3.5–5.1)
Sodium: 138 mmol/L (ref 135–145)

## 2023-10-10 LAB — MAGNESIUM: Magnesium: 2.2 mg/dL (ref 1.7–2.4)

## 2023-10-10 MED ORDER — VITAMIN B-12 1000 MCG PO TABS
500.0000 ug | ORAL_TABLET | Freq: Two times a day (BID) | ORAL | Status: DC
  Administered 2023-10-10 – 2023-10-11 (×2): 500 ug via ORAL
  Filled 2023-10-10 (×3): qty 1

## 2023-10-10 MED ORDER — APIXABAN 5 MG PO TABS
5.0000 mg | ORAL_TABLET | Freq: Two times a day (BID) | ORAL | Status: DC
  Administered 2023-10-11: 5 mg via ORAL
  Filled 2023-10-10: qty 1

## 2023-10-10 MED ORDER — ENSURE ENLIVE PO LIQD: 237.0000 mL | Freq: Three times a day (TID) | ORAL | Status: DC

## 2023-10-10 MED ORDER — ADULT MULTIVITAMIN W/MINERALS CH
1.0000 | ORAL_TABLET | Freq: Every day | ORAL | Status: DC
  Administered 2023-10-11: 1 via ORAL
  Filled 2023-10-10 (×2): qty 1

## 2023-10-10 NOTE — Progress Notes (Signed)
 Pharmacy Antibiotic Note  Marcus [name] is a 83 y.o. male admitted on 10/03/2023 with  intra-abdominal infection . PMH significant for pancreatic cancer, HIDA scan w/ cystic duct obstruction s/p perc chole drain placement on 4/1. Bile cultures growing multiple organisms. Pharmacy has been consulted for cefepime dosing.   WBC down to 1.8 on 4/6 >> improved to 2.3 today. ANC 1.2 >> 1.3 from yesterday. Afebrile, Scr continues to improve.  Plan: Continue cefepime 2 gm IV q12h Continue Flagyl 500 mg IV q12h Follow up renal function, WBC, temp, and clinical course  Height: 5\' 8"  (172.7 cm) Weight: 83.9 kg (185 lb) IBW/kg (Calculated) : 68.4  Temp (24hrs), Avg:98 F (36.7 C), Min:97.8 F (36.6 C), Max:98.4 F (36.9 C)  Recent Labs  Lab 10/03/23 1146 10/03/23 1448 10/04/23 0350 10/05/23 1058 10/05/23 1423 10/05/23 1617 10/06/23 0814 10/07/23 0828 10/08/23 0413 10/09/23 0356 10/10/23 0500  WBC  --   --    < >  --   --   --  15.8* 4.9 3.0* 1.8* 2.3*  CREATININE  --   --    < >  --   --   --  1.95* 1.54* 1.32* 1.19 1.18  LATICACIDVEN 0.6 0.7  --  2.7* 2.3* 2.3*  --   --   --   --   --    < > = values in this interval not displayed.    Estimated Creatinine Clearance: 50 mL/min (by C-G formula based on SCr of 1.18 mg/dL).    No Known Allergies  Antimicrobials this admission: 3/31 Piperacillin/tazobactam  >> 4/4 4/4 cefepime>> 4/4 Flagyl>>   Micro: 4/1 MRSA: not detected 4/2 Bcx: NGf 4/1 Bile: abun GPC. mod GNR, moderate C.freundii >> multiple bacteria, per lab, anaerobic & aerobic per phone call. No staph or strep A. Final    Cherylin Mylar, PharmD Clinical Pharmacist  4/7/20258:21 AM

## 2023-10-10 NOTE — Evaluation (Signed)
 Physical Therapy Evaluation Patient Details Name: Marcus [name] MRN: [id number] DOB: 03-Nov-1940 Today's Date: 10/10/2023  History of Present Illness  Pt is 83 yo male admitted on 10/03/23 with recurrent abdominal pain and admitted with septic shock due to acute cholecystitis/cystic duct obstruction. Pt s/p IR perc cholecystectomy tube placement on 4/1.  Pt with hypotension during admission requiring tx to ICU for pressors - now weaned. Pt with hx including but not limited to pancreatic CA, afib, HTN, CAD, gout, prostate CA, OSA, L TKA, pacemaker, recent cholecystitis with cholecystostomy tube 09/03/23 removed  on 09/22/23.  Clinical Impression  Pt admitted with above diagnosis. At baseline, pt resides at home with his son and is independent.  He has been using rollator more in the past few weeks but still mod I ambulator.  Today, pt able to transfer with supervision and ambulated 200' with CGA.  He does have some gait abnormalities related to leg length discrepancies that are baseline, but does reports feels weaker and more unsteady than baseline.  Pt will benefit from acute PT services and recommendation for HHPT.  He has DME and home support.  He did mention interest in help with med management and drain management - -dtr mentioned they would be able to assist but was also interested in [facility] nursing - notified TOC.   Pt currently with functional limitations due to the deficits listed below (see PT Problem List). Pt will benefit from acute skilled PT to increase their independence and safety with mobility to allow discharge.   .        If plan is discharge home, recommend the following: A little help with walking and/or transfers;A little help with bathing/dressing/bathroom;Assistance with cooking/housework;Help with stairs or ramp for entrance;Direct supervision/assist for medications management   Can travel by private vehicle        Equipment Recommendations None recommended by PT  Recommendations  for Other Services       Functional Status Assessment Patient has had a recent decline in their functional status and demonstrates the ability to make significant improvements in function in a reasonable and predictable amount of time.     Precautions / Restrictions Precautions Precautions: Fall Precaution/Restrictions Comments: cholecystectomy tube      Mobility  Bed Mobility               General bed mobility comments: in bathroom at arrival    Transfers Overall transfer level: Needs assistance Equipment used: Rolling walker (2 wheels) Transfers: Sit to/from Stand Sit to Stand: Supervision                Ambulation/Gait Ambulation/Gait assistance: Contact guard assist Gait Distance (Feet): 200 Feet Assistive device: Rolling walker (2 wheels) Gait Pattern/deviations: Step-through pattern, Decreased stride length, Trendelenburg Gait velocity: decreased     General Gait Details: Trendlenberg on R and vault on L; reports L LE longer than R ; fatigued easier than baseline per report  Stairs            Wheelchair Mobility     Tilt Bed    Modified Rankin (Stroke Patients Only)       Balance Overall balance assessment: Needs assistance Sitting-balance support: No upper extremity supported Sitting balance-Leahy Scale: Good     Standing balance support: No upper extremity supported, Bilateral upper extremity supported Standing balance-Leahy Scale: Fair Standing balance comment: Could stand without UE support; RW to ambulate  Pertinent Vitals/Pain Pain Assessment Pain Assessment: 0-10 Pain Score: 8  Pain Location: R flank- fast shooting pains that go away Pain Descriptors / Indicators: Shooting Pain Intervention(s): Limited activity within patient's tolerance, Monitored during session    Home Living Family/patient expects to be discharged to:: Private residence Living Arrangements: Children  (son) Available Help at Discharge: Family;Available 24 hours/day Type of Home: House Home Access: Stairs to enter Entrance Stairs-Rails: Left Entrance Stairs-Number of Steps: 3   Home Layout: One level Home Equipment: Cane - single point;Shower seat - built in;Grab bars - tub/shower;Hand held Stage manager (4 wheels)      Prior Function Prior Level of Function : Independent/Modified Independent             Mobility Comments: Uses rollator more lately but still cane at times ADLs Comments: Pt reports independent and driving     Extremity/Trunk Assessment   Upper Extremity Assessment Upper Extremity Assessment: Defer to OT evaluation    Lower Extremity Assessment Lower Extremity Assessment:  (ROM WFL; MMT grossly 5/5; reports R leg shorter than L)    Cervical / Trunk Assessment Cervical / Trunk Assessment: Normal  Communication        Cognition Arousal: Alert Behavior During Therapy: WFL for tasks assessed/performed   PT - Cognitive impairments: No apparent impairments                                 Cueing       General Comments General comments (skin integrity, edema, etc.): Pt's daughter present    Exercises     Assessment/Plan    PT Assessment Patient needs continued PT services  PT Problem List Decreased strength;Decreased range of motion;Decreased activity tolerance;Decreased balance;Decreased mobility;Decreased knowledge of use of DME       PT Treatment Interventions DME instruction;Therapeutic exercise;Gait training;Balance training;Stair training;Functional mobility training;Therapeutic activities;Patient/family education;Neuromuscular re-education    PT Goals (Current goals can be found in the Care Plan section)  Acute Rehab PT Goals Patient Stated Goal: return home PT Goal Formulation: With patient/family Time For Goal Achievement: 10/24/23 Potential to Achieve Goals: Good    Frequency Min 2X/week     Co-evaluation                AM-PAC PT "6 Clicks" Mobility  Outcome Measure Help needed turning from your back to your side while in a flat bed without using bedrails?: A Little Help needed moving from lying on your back to sitting on the side of a flat bed without using bedrails?: A Little Help needed moving to and from a bed to a chair (including a wheelchair)?: A Little Help needed standing up from a chair using your arms (e.g., wheelchair or bedside chair)?: A Little Help needed to walk in hospital room?: A Little Help needed climbing 3-5 steps with a railing? : A Little 6 Click Score: 18    End of Session Equipment Utilized During Treatment: Gait belt Activity Tolerance: Patient tolerated treatment well Patient left: with call bell/phone within reach;in chair;with chair alarm set Nurse Communication: Mobility status PT Visit Diagnosis: Other abnormalities of gait and mobility (R26.89);Muscle weakness (generalized) (M62.81)    Time: 1610-9604 PT Time Calculation (min) (ACUTE ONLY): 20 min   Charges:   PT Evaluation $PT Eval Low Complexity: 1 Low   PT General Charges $$ ACUTE PT VISIT: 1 Visit         Zygmunt Mcglinn, PT Acute Rehab Services  [facility] [phone]   Rayetta Humphrey 10/10/2023, 12:14 PM

## 2023-10-10 NOTE — Progress Notes (Signed)
 PROGRESS NOTE Marcus [name]  WGN:[id number] DOB: July 23, 1940 DOA: 10/03/2023 PCP: [name], NP  Brief Narrative/Hospital Course: 83 yo male with PMH pancreatic cancer, A-fib, HTN, CAD, gout, OSA, prostate cancer who presented with recurrent abdominal pain who had recently been diagnosed with acute cholecystitis and underwent cholecystostomy tube placement on 09/03/2023, recent duodenal stent placed during that hospitalization due to gastric outlet obstruction and on 09/22/2023 his cholecystostomy tube was removed by radiology after found to be displaced who presented with recurrent symptoms-abdominal pain nausea vomiting x 3 days no fever. CT a/p, RUQ [place] and HIDA scan with findings of ongoing cholecystitis and recurrent cystic duct obstruction S/p IR placement of percutaneous cholecystostomy tube and patient with progression to hypotension, PCCM consulted 4/2,> transferred to [facility] for pressors.  Patient had been weaned off pressors to midodrine.  Culture drain polymicrobial-being managed with IV antibiotics, IR following for drain. 10/09/23> transferred to [facility] and continued on antibiotics, remains medically stable  Subjective: Seen and examined this morning Daughter at the bedside earlier had abdominal pain on right upper quadrant improved with pain medication Appears weak and frail, was too tired to work with PT OT Overnight afebrile BP stable in the 120s.   Labs reviewed creatinine improved 1.1 extubated to lites leukopenia improving  wbc up 1.8> 2.4, hb up 8.3> 8.5 plt trending up 113    Assessment and plan:  Septic shock due to acute cholecystitis/cystic duct obstruction S/p IR Perc cholecystectomy tube placement 4/1 Possible RLL pneumonia CT abd 4/1-interval cholecystostomy catheter placement with decompression of the gallbladder, no other significant finding.  Some atelectasis, air bronchograms at lower lobe and pancreatic mass. Weaned off pressors-BP stable continue midodrine 10mg  tid  and cont to wean as able. Blood culture NGTD. Biliary culture-polymicrobial-Klebsiella, Streptococcus, Enterococcus faecalis cont Cefepime/metronidazole 4/5>> will discuss with ID/pharmacy to transition antibiotics- amoxy+ ceftin on discharge.  Abdominal pain: Likely in the setting of pancreatic malignancy versus cholecystitis. Continue pain management  AKI Metabolic acidosis: Peaked to 1.9. and now resolved.  Encourage oral hydration Recent Labs  Lab 10/06/23 0814 10/07/23 0828 10/08/23 0413 10/09/23 0356 10/10/23 0500  BUN 34* 33* 28* 25* 21  CREATININE 1.95* 1.54* 1.32* 1.19 1.18    BPH Cont flomax/Ditropan   Hypertension CHFrEF: PTA on amlodipine/valsartan hydrochlorothiazide- all his antihypertensive on hold. Echo 4/30 shows EF 35 40%  ( previous 45-50% in 12/14/22), G1 DD GHK RV SF normal and RV size normal LA moderately dilated-  Leukopenia likely from sepsis Thrombocytopenia due to sepsis: Anemia of malignancy WBC count, platelet count trending up hemoglobin is stable.  Monitor  Recent Labs  Lab 10/08/23 0413 10/09/23 0356 10/10/23 0500  HGB 8.7* 8.3* 8.5*  HCT 27.3* 26.3* 27.6*  WBC 3.0* 1.8* 2.3*  PLT 114* 100* 113*    ?  On long-term prednisone:  Patient reported not taking it we will discuss due to [facility] tomorrow abt this- he prescribed that back in March 2  continue PT OT. ?Long-term prednisone he is not sure-was palced by Dr Truett Perna- will d/w Dr Alcide Evener.  Atrial fibrillation: Currently AV paced.  Eliquis remains on hold since 3/30-await if onco and IR okay with resumption message sent    Pancreatic adenocarcinoma Goals of care discussion: Followed by Dr. Myrle Sheng. As of 3/27 patient has decided to hold on further radiation and Xeloda treatments He is DNR DNI  Failure to thrive Severe protein calorie malnutrition: Augment diet, has lost about 35 pounds since January.  Dietitian consulted    DVT prophylaxis:  enoxaparin (LOVENOX) injection 40 mg  Start: 10/03/23 2200 Code Status:   Code Status: Limited: Do not attempt resuscitation (DNR) -DNR-LIMITED -Do Not Intubate/DNI  Family Communication: plan of care discussed with patient/daughter updated extensively at bedside. Patient status is: Remains hospitalized because of severity of illness Level of care: Telemetry   Dispo: The patient is from: Home            Anticipated disposition: Anticipate discharge home in 1 to 2 days   Objective: Vitals last 24 hrs: Vitals:   10/09/23 1410 10/09/23 1810 10/09/23 2114 10/10/23 0407  BP: 132/74 126/69 121/69 121/64  Pulse: (!) 59 (!) 59 (!) 59 60  Resp: 19 16 16 18   Temp: 97.9 F (36.6 C) 97.8 F (36.6 C) 98.4 F (36.9 C) 97.9 F (36.6 C)  TempSrc: Oral Oral Oral Oral  SpO2: 97% 96% 94% 95%  Weight:      Height:       Weight change:   Physical Examination: General exam: alert awake, oriented. HEENT:Oral mucosa moist, Ear/Nose WNL grossly. Respiratory system: Bilaterally clear BS,no use of accessory muscle. Cardiovascular system: S1 & S2 +, No JVD. Gastrointestinal system: Abdomen soft,NT,ND, BS+. Nervous System: Alert, awake, weak. Extremities: LE edema neg,distal peripheral pulses palpable and warm.  Skin: No rashes,no icterus. MSK: Normal muscle bulk,tone, power.   Ruq drain+  Medications reviewed:  Scheduled Meds:  Chlorhexidine Gluconate Cloth  6 each Topical Daily   enoxaparin (LOVENOX) injection  40 mg Subcutaneous Q24H   feeding supplement  237 mL Oral TID BM   midodrine  10 mg Oral TID WC   multivitamin with minerals  1 tablet Oral Daily   polyethylene glycol  17 g Oral Daily   sodium chloride flush  10-40 mL Intracatheter Q12H   sodium chloride flush  5 mL Intracatheter Q8H   tamsulosin  0.4 mg Oral Daily   Continuous Infusions:  ceFEPime (MAXIPIME) IV 2 g (10/10/23 1022)   metronidazole 500 mg (10/10/23 1021)    Diet Order             Diet regular Room service appropriate? Yes; Fluid consistency: Thin   Diet effective now                    Intake/Output Summary (Last 24 hours) at 10/10/2023 1150 Last data filed at 10/10/2023 0526 Gross per 24 hour  Intake 581.69 ml  Output 90 ml  Net 491.69 ml   Net IO Since Admission: 3,261.08 mL [10/10/23 1150]  Wt Readings from Last 3 Encounters:  10/03/23 83.9 kg  09/29/23 89 kg  09/14/23 92.8 kg     Unresulted Labs (From admission, onward)    None     Data Reviewed: I have personally reviewed following labs and imaging studies ( see epic result tab) CBC: Recent Labs  Lab 10/06/23 0814 10/07/23 0828 10/08/23 0413 10/09/23 0356 10/10/23 0500  WBC 15.8* 4.9 3.0* 1.8* 2.3*  NEUTROABS 13.4* 3.9 2.2 1.2* 1.3*  HGB 9.8* 8.7* 8.7* 8.3* 8.5*  HCT 30.3* 28.1* 27.3* 26.3* 27.6*  MCV 85.8 87.0 85.8 85.7 87.1  PLT 173 112* 114* 100* 113*   CMP: Recent Labs  Lab 10/06/23 0814 10/07/23 0828 10/08/23 0413 10/09/23 0356 10/10/23 0500  NA 131* 134* 135 137 138  K 3.3* 3.6 3.7 3.7 3.7  CL 103 104 104 110 108  CO2 22 23 23 23 23   GLUCOSE 147* 134* 110* 101* 99  BUN 34* 33* 28* 25* 21  CREATININE 1.95* 1.54* 1.32* 1.19 1.18  CALCIUM 7.9* 8.0* 8.2* 8.0* 8.2*  MG 2.0 2.2 2.2 2.3 2.2  PHOS  --   --   --  2.9  --    GFR: Estimated Creatinine Clearance: 50 mL/min (by C-G formula based on SCr of 1.18 mg/dL). Recent Labs  Lab 10/06/23 0814  AST 20  ALT 14  ALKPHOS 51  BILITOT 0.4  PROT 5.6*  ALBUMIN 2.4*   No results for input(s): "LIPASE", "AMYLASE" in the last 168 hours.  No results for input(s): "AMMONIA" in the last 168 hours. Coagulation Profile:  Recent Labs  Lab 10/04/23 0350  INR 1.4*   No results for input(s): "PROBNP" in the last 168 hours.  No results for input(s): "HGBA1C" in the last 72 hours. Recent Labs  Lab 10/04/23 1755 10/05/23 1126  GLUCAP 88 225*   No results for input(s): "CHOL", "HDL", "LDLCALC", "TRIG", "CHOLHDL", "LDLDIRECT" in the last 72 hours. No results for input(s): "TSH", "T4TOTAL",  "FREET4", "T3FREE", "THYROIDAB" in the last 72 hours. Sepsis Labs: Recent Labs  Lab 10/03/23 1448 10/05/23 1058 10/05/23 1423 10/05/23 1617  LATICACIDVEN 0.7 2.7* 2.3* 2.3*   Recent Results (from the past 240 hours)  Aerobic/Anaerobic Culture w Gram Stain (surgical/deep wound)     Status: None   Collection Time: 10/04/23  3:04 PM   Specimen: BILE  Result Value Ref Range Status   Specimen Description   Final    BILE Performed at [facility], [address], [place], [place] [address]    Special Requests   Final    NONE Performed at [facility], [address] [place], [place] [address]    Gram Stain   Final    FEW WBC PRESENT,BOTH PMN AND MONONUCLEAR ABUNDANT GRAM POSITIVE COCCI MODERATE GRAM NEGATIVE RODS RARE GRAM POSITIVE RODS    Culture   Final    Multiple bacterial morphotypes present, none predominant. Suggest appropriate recollection if clinically indicated. MIXED ANAEROBIC FLORA PRESENT.  CALL LAB IF FURTHER IID REQUIRED. NO STAPHYLOCOCCUS AUREUS ISOLATED NO GROUP A STREP (S.PYOGENES) ISOLATED Performed at [facility], [address], [place], [place] [address]    Report Status 10/07/2023 FINAL  Final  MRSA Next Gen by PCR, Nasal     Status: None   Collection Time: 10/04/23  6:31 PM   Specimen: Nasal Mucosa; Nasal Swab  Result Value Ref Range Status   MRSA by PCR Next Gen NOT DETECTED NOT DETECTED Final    Comment: (NOTE) The GeneXpert MRSA Assay (FDA approved for NASAL specimens only), is one component of a comprehensive MRSA colonization surveillance program. It is not intended to diagnose MRSA infection nor to guide or monitor treatment for MRSA infections. Test performance is not FDA approved in patients less than 15 years old. Performed at [facility], [address], [place], [place] [address]   Culture, blood (Routine X 2) w Reflex to ID Panel     Status: None   Collection Time:  10/05/23  4:17 PM   Specimen: BLOOD LEFT ARM  Result Value Ref Range Status   Specimen Description   Final    BLOOD LEFT ARM Performed at [facility], [address] [place], [place] [address]    Special Requests   Final    BOTTLES DRAWN AEROBIC AND ANAEROBIC Blood Culture adequate volume Performed at [facility], [address] [place], [place] [address]    Culture  Final    NO GROWTH 5 DAYS Performed at [facility], [address] [place], [place] [address]    Report Status 10/10/2023 FINAL  Final  Culture, blood (Routine X 2) w Reflex to ID Panel     Status: None   Collection Time: 10/05/23  4:22 PM   Specimen: BLOOD LEFT ARM  Result Value Ref Range Status   Specimen Description   Final    BLOOD LEFT ARM Performed at [facility], [address] [place], [place] [address]    Special Requests   Final    BOTTLES DRAWN AEROBIC AND ANAEROBIC Blood Culture results may not be optimal due to an inadequate volume of blood received in culture bottles Performed at [facility], [address] [place], [place] [address]    Culture   Final    NO GROWTH 5 DAYS Performed at [facility], [address], [place], [place] [address]    Report Status 10/10/2023 FINAL  Final   Antimicrobials/Microbiology: Anti-infectives (From admission, onward)    Start     Dose/Rate Route Frequency Ordered Stop   10/07/23 1000  ceFEPIme (MAXIPIME) 2 g in sodium chloride 0.9 % 100 mL IVPB        2 g 200 mL/hr over 30 Minutes Intravenous Every 12 hours 10/07/23 0835     10/07/23 1000  metroNIDAZOLE (FLAGYL) IVPB 500 mg        500 mg 100 mL/hr over 60 Minutes Intravenous Every 12 hours 10/07/23 0835     10/03/23 2000  piperacillin-tazobactam (ZOSYN) IVPB 3.375 g  Status:  Discontinued        3.375 g 12.5 mL/hr over 240 Minutes Intravenous Every 8 hours 10/03/23 1415 10/07/23 0833   10/03/23 1245  piperacillin-tazobactam (ZOSYN) IVPB  3.375 g        3.375 g 100 mL/hr over 30 Minutes Intravenous  Once 10/03/23 1241 10/03/23 1326         Component Value Date/Time   SDES  10/05/2023 1622    BLOOD LEFT ARM Performed at [facility], [address], [place], [place] [address]    SPECREQUEST  10/05/2023 1622    BOTTLES DRAWN AEROBIC AND ANAEROBIC Blood Culture results may not be optimal due to an inadequate volume of blood received in culture bottles Performed at [facility], [address], [place], [place] [address]    CULT  10/05/2023 1622    NO GROWTH 5 DAYS Performed at [facility], [address] [place], [place] [address]    REPTSTATUS 10/10/2023 FINAL 10/05/2023 1622     Radiology Studies: No results found.   LOS: 7 days   Total time spent in review of labs and imaging, patient evaluation, formulation of plan, documentation and communication with patient/family:  35  minutes  Lanae Boast, MD [facility] 10/10/2023, 11:50 AM

## 2023-10-10 NOTE — Progress Notes (Addendum)
 Marcus [name]   DOB:28-Mar-1941   HQ#:[id number]      ASSESSMENT & PLAN:  Pancreatic cancer - Diagnosed 12/03/2022. - Started on chemotherapy regimen with gemcitabine and Abraxane on 01/07/2023.  Completed cycle 12 on 06/24/2023.   -Subsequently planned for radiation therapy plus concurrent Xeloda. - No treatment planned this admission.  - Medical oncology/Dr. Truett Perna following.  Follow-up outpatient at the cancer center as scheduled.   Abdominal pain - Continues to complain of abdominal pain - Likely related to malignancy versus gallbladder disease - Status post hepatobiliary/HIDA scan done 10/04/2023, drain placed by IR.  - Continue pain management as ordered  Leukopenia - WBC 2.3 today - Patient remains afebrile, monitor fever curve - Continue IV antibiotics as ordered - Continue to monitor CBC with differential   Anemia - Hemoglobin noted to be decreasing 8.5 today - Baseline hemoglobin appears to be in the 8-9 range. - No transfusional intervention required at this time - Continue to monitor CBC with differential   Thrombocytopenia - Mild - Platelets decreasing 113k today - Continue to monitor CBC with differential   A-fib Hypertension - Continue to monitor blood pressure and vitals closely - Medicine following        Code Status DNR-Limited  Subjective:  Patient seen awake alert and oriented being assisted out of bed to bathroom with walker.   Still complains of abdominal pain.  Patient's family member is at bedside.  No other complaints offered.  No acute distress is noted.  Objective:  Vitals:   10/09/23 2114 10/10/23 0407  BP: 121/69 121/64  Pulse: (!) 59 60  Resp: 16 18  Temp: 98.4 F (36.9 C) 97.9 F (36.6 C)  SpO2: 94% 95%     Intake/Output Summary (Last 24 hours) at 10/10/2023 1044 Last data filed at 10/10/2023 4132 Gross per 24 hour  Intake 681.69 ml  Output 90 ml  Net 591.69 ml     REVIEW OF SYSTEMS:   Constitutional: + Generalized  weakness, denies fevers, chills or abnormal night sweats Eyes: Denies blurriness of vision, double vision or watery eyes Ears, nose, mouth, throat, and face: Denies mucositis or sore throat Respiratory: Denies cough, dyspnea or wheezes Cardiovascular: Denies palpitation, chest discomfort or lower extremity swelling Gastrointestinal:  Denies nausea, heartburn or change in bowel habits Skin: Denies abnormal skin rashes Lymphatics: Denies new lymphadenopathy or easy bruising Neurological: Denies numbness, tingling or new weaknesses Behavioral/Psych: Mood is stable, no new changes  All other systems were reviewed with the patient and are negative.  PHYSICAL EXAMINATION: ECOG PERFORMANCE STATUS: 3 - Symptomatic, >50% confined to bed  Vitals:   10/09/23 2114 10/10/23 0407  BP: 121/69 121/64  Pulse: (!) 59 60  Resp: 16 18  Temp: 98.4 F (36.9 C) 97.9 F (36.6 C)  SpO2: 94% 95%   Filed Weights   10/03/23 0920  Weight: 185 lb (83.9 kg)    GENERAL: alert, + mild distress and comfortable SKIN: skin color, texture, turgor are normal, no rashes or significant lesions EYES: normal, conjunctiva are pink and non-injected, sclera clear OROPHARYNX: no exudate, no erythema and lips, buccal mucosa, and tongue normal  NECK: supple, thyroid normal size, non-tender, without nodularity LYMPH: no palpable lymphadenopathy in the cervical, axillary or inguinal LUNGS: clear to auscultation and percussion with normal breathing effort HEART: regular rate & rhythm and no murmurs and no lower extremity edema ABDOMEN: abdomen soft, non-tender and normal bowel sounds MUSCULOSKELETAL: no cyanosis of digits and no clubbing  PSYCH: alert & oriented  x 3 with fluent speech NEURO: no focal motor/sensory deficits   All questions were answered. The patient knows to call the clinic with any problems, questions or concerns.   The total time spent in the appointment was 40 minutes encounter with patient including  review of chart and various tests results, discussions about plan of care and coordination of care plan  [name], NP 10/10/2023 10:44 AM    Labs Reviewed:  Lab Results  Component Value Date   WBC 2.3 (L) 10/10/2023   HGB 8.5 (L) 10/10/2023   HCT 27.6 (L) 10/10/2023   MCV 87.1 10/10/2023   PLT 113 (L) 10/10/2023   Recent Labs    09/29/23 1412 10/03/23 0944 10/04/23 0350 10/06/23 0814 10/07/23 0828 10/08/23 0413 10/09/23 0356 10/10/23 0500  NA 139 136   < > 131*   < > 135 137 138  K 3.9 3.3*   < > 3.3*   < > 3.7 3.7 3.7  CL 107 107   < > 103   < > 104 110 108  CO2 27 22   < > 22   < > 23 23 23   GLUCOSE 119* 113*   < > 147*   < > 110* 101* 99  BUN 23 25*   < > 34*   < > 28* 25* 21  CREATININE 0.95 1.06   < > 1.95*   < > 1.32* 1.19 1.18  CALCIUM 8.8* 8.6*   < > 7.9*   < > 8.2* 8.0* 8.2*  GFRNONAA >60 >60   < > 34*   < > 54* >60 >60  PROT 6.0* 6.0*  --  5.6*  --   --   --   --   ALBUMIN 3.3* 2.9*  --  2.4*  --   --   --   --   AST 15 21  --  20  --   --   --   --   ALT 10 14  --  14  --   --   --   --   ALKPHOS 51 54  --  51  --   --   --   --   BILITOT 0.3 0.6  --  0.4  --   --   --   --   BILIDIR  --   --   --  0.1  --   --   --   --   IBILI  --   --   --  0.3  --   --   --   --    < > = values in this interval not displayed.    Studies Reviewed:  ECHOCARDIOGRAM COMPLETE Result Date: 10/06/2023    ECHOCARDIOGRAM REPORT   Patient Name:   Marcus [name] Date of Exam: 10/06/2023 Medical Rec #:  [id number]       Height:       68.0 in Accession #:    [id number]      Weight:       185.0 lb Date of Birth:  17-May-1941       BSA:          1.977 m Patient Age:    82 years        BP:           124/45 mmHg Patient Gender: M               HR:  63 bpm. Exam Location:  Inpatient Procedure: 2D Echo, Color Doppler, Cardiac Doppler and Intracardiac            Opacification Agent (Both Spectral and Color Flow Doppler were            utilized during procedure). Indications:    Shock   History:        Patient has prior history of Echocardiogram examinations, most                 recent 12/14/2022. CHF, COPD, Arrythmias:AV Block; Risk                 Factors:Sleep Apnea, Former Smoker and Hypertension.  Sonographer:    Lamont Snowball Referring Phys: [phone] A CALDWELL POWELL JR  Sonographer Comments: Technically difficult study due to poor echo windows. IMPRESSIONS  1. Left ventricular ejection fraction, by estimation, is 35 to 40%. Left ventricular ejection fraction by 2D MOD biplane is 35.5 %. The left ventricle has moderately decreased function. The left ventricle demonstrates global hypokinesis. Left ventricular diastolic parameters are consistent with Grade I diastolic dysfunction (impaired relaxation).  2. Right ventricular systolic function is normal. The right ventricular size is normal.  3. Left atrial size was moderately dilated.  4. Right atrial size was moderately dilated.  5. The mitral valve is abnormal. Trivial mitral valve regurgitation.  6. The aortic valve is tricuspid. Aortic valve regurgitation is not visualized. Aortic valve sclerosis/calcification is present, without any evidence of aortic stenosis. Aortic valve mean gradient measures 8.7 mmHg.  7. Aortic dilatation noted. There is mild dilatation of the ascending aorta, measuring 40 mm. Comparison(s): Changes from prior study are noted. 12/14/2022: LVEF 45-50%. FINDINGS  Left Ventricle: No apical thrombus with Definity contrast. Left ventricular ejection fraction, by estimation, is 35 to 40%. Left ventricular ejection fraction by 2D MOD biplane is 35.5 %. The left ventricle has moderately decreased function. The left ventricle demonstrates global hypokinesis. Definity contrast agent was given IV to delineate the left ventricular endocardial borders. The left ventricular internal cavity size was normal in size. There is no left ventricular hypertrophy. Abnormal (paradoxical) septal motion, consistent with RV pacemaker. Left  ventricular diastolic parameters are consistent with Grade I diastolic dysfunction (impaired relaxation). Indeterminate filling pressures. Right Ventricle: The right ventricular size is normal. No increase in right ventricular wall thickness. Right ventricular systolic function is normal. Left Atrium: Left atrial size was moderately dilated. Right Atrium: Right atrial size was moderately dilated. Pericardium: There is no evidence of pericardial effusion. Mitral Valve: The mitral valve is abnormal. There is mild calcification of the anterior and posterior mitral valve leaflet(s). Trivial mitral valve regurgitation. MV peak gradient, 5.2 mmHg. The mean mitral valve gradient is 2.0 mmHg. Tricuspid Valve: The tricuspid valve is grossly normal. Tricuspid valve regurgitation is trivial. Aortic Valve: The aortic valve is tricuspid. Aortic valve regurgitation is not visualized. Aortic valve sclerosis/calcification is present, without any evidence of aortic stenosis. Aortic valve mean gradient measures 8.7 mmHg. Aortic valve peak gradient measures 15.5 mmHg. Aortic valve area, by VTI measures 2.12 cm. Pulmonic Valve: The pulmonic valve was normal in structure. Pulmonic valve regurgitation is not visualized. Aorta: Aortic dilatation noted. There is mild dilatation of the ascending aorta, measuring 40 mm. Venous: The inferior vena cava was not well visualized. IAS/Shunts: No atrial level shunt detected by color flow Doppler. Additional Comments: A device lead is visualized.  LEFT VENTRICLE PLAX 2D  Biplane EF (MOD) LVIDd:         4.60 cm         LV Biplane EF:   Left LVIDs:         2.60 cm                          ventricular LV PW:         0.80 cm                          ejection LV IVS:        0.90 cm                          fraction by LVOT diam:     2.20 cm                          2D MOD LV SV:         81                               biplane is LV SV Index:   41                               35.5  %. LVOT Area:     3.80 cm                                Diastology                                LV e' medial:    9.79 cm/s LV Volumes (MOD)               LV E/e' medial:  5.4 LV vol d, MOD    129.0 ml      LV e' lateral:   9.14 cm/s A2C:                           LV E/e' lateral: 5.8 LV vol d, MOD    131.0 ml A4C: LV vol s, MOD    68.3 ml A2C: LV vol s, MOD    96.7 ml A4C: LV SV MOD A2C:   60.7 ml LV SV MOD A4C:   131.0 ml LV SV MOD BP:    45.9 ml RIGHT VENTRICLE RV Basal diam:  4.00 cm RV S prime:     13.70 cm/s TAPSE (M-mode): 2.3 cm LEFT ATRIUM             Index        RIGHT ATRIUM           Index LA Vol (A2C):   99.7 ml 50.43 ml/m  RA Area:     25.70 cm LA Vol (A4C):   75.4 ml 38.14 ml/m  RA Volume:   88.80 ml  44.91 ml/m LA Biplane Vol: 89.1 ml 45.07 ml/m  AORTIC VALVE AV Area (Vmax):    2.20 cm AV Area (Vmean):   2.29 cm AV Area (VTI):     2.12 cm AV Vmax:  197.11 cm/s AV Vmean:          139.577 cm/s AV VTI:            0.383 m AV Peak Grad:      15.5 mmHg AV Mean Grad:      8.7 mmHg LVOT Vmax:         114.00 cm/s LVOT Vmean:        84.100 cm/s LVOT VTI:          0.214 m LVOT/AV VTI ratio: 0.56  AORTA Ao Root diam: 3.50 cm Ao Asc diam:  4.00 cm MITRAL VALVE MV Area (PHT): 1.60 cm     SHUNTS MV Area VTI:   2.58 cm     Systemic VTI:  0.21 m MV Peak grad:  5.2 mmHg     Systemic Diam: 2.20 cm MV Mean grad:  2.0 mmHg MV Vmax:       1.14 m/s MV Vmean:      59.9 cm/s MV Decel Time: 474 msec MV E velocity: 53.10 cm/s MV A velocity: 101.00 cm/s MV E/A ratio:  0.53 Zoila Shutter MD Electronically signed by Zoila Shutter MD Signature Date/Time: 10/06/2023/12:15:34 PM    Final    CT ABDOMEN PELVIS W CONTRAST Result Date: 10/05/2023 CLINICAL DATA:  Abdominal pain, acute, nonlocalized post cholecystostomy catheter placement procedure pain EXAM: CT ABDOMEN AND PELVIS WITH CONTRAST TECHNIQUE: Multidetector CT imaging of the abdomen and pelvis was performed using the standard protocol following bolus  administration of intravenous contrast. RADIATION DOSE REDUCTION: This exam was performed according to the departmental dose-optimization program which includes automated exposure control, adjustment of the mA and/or kV according to patient size and/or use of iterative reconstruction technique. CONTRAST:  75mL OMNIPAQUE IOHEXOL 300 MG/ML  SOLN COMPARISON:  10/03/2023 FINDINGS: Lower chest: Small pleural effusions right greater than left have developed. No pneumothorax. Patchy consolidation/atelectasis with air bronchograms posteriorly in the lung bases, new since previous. Hepatobiliary: Interval transhepatic pigtail cholecystostomy catheter placement, well position, with decompression of the gallbladder. Persistent gallbladder wall thickening in some regional inflammatory/edematous change. Metallic stent in the CBD stable, with pneumobilia implying patency of the stent. Stable scattered hepatic cysts, largest 1.6 cm in the posterior right lobe. Pancreas: Marked pancreatic ductal dilatation and parenchymal atrophy as before, with poor delineation of the pancreatic head mass, nearly encircling the SMV and abutting the SMA. No new peripancreatic fluid collections. Spleen: Normal in size without focal abnormality. Adrenals/Urinary Tract: No adrenal lesion. 6 mm linear calcification in the right renal hilum probably atherosclerotic. No definite urolithiasis. No hydronephrosis or worrisome renal lesion. The urinary bladder is nondistended. Stomach/Bowel: Post gastroplasty. Patent stent to the second portion of the duodenum. Distal duodenum is physiologically distended. Small bowel is nondilated, with incomplete distal passage of the oral contrast material. Normal appendix. The colon is partially distended, without acute finding. Anastomotic staple line in the distal sigmoid segment. Vascular/Lymphatic: Moderate scattered aortoiliac calcified plaque without aneurysm. Narrowing of the distal superior mesenteric vein and  proximal main portal vein; intrahepatic portal venous branches patent. No abdominal or pelvic adenopathy localized. Reproductive: Multiple metallic seeds in the prostate. Other: Small volume pelvic ascites, new since previous. No free air. Musculoskeletal: Post ventral hernia repair. Vertebral endplate spurring at multiple levels in the lower thoracic spine. Multilevel lumbar spondylitic change with fusion L3-L5. Chronic appearing sacral fracture deformity. IMPRESSION: 1. Interval cholecystostomy catheter placement, with decompression of the gallbladder, no hematoma or other apparent complication. 2. New small volume pelvic ascites. 3. New  small pleural effusions right greater than left, with patchy consolidation/atelectasis posteriorly in the lung bases. 4. Stable pancreatic head mass with pancreatic ductal dilatation, parenchymal atrophy, encasement of the portosplenic confluence. Electronically Signed   By: Corlis Leak M.D.   On: 10/05/2023 15:01   DG CHEST PORT 1 VIEW Result Date: 10/04/2023 CLINICAL DATA:  Hypoxia EXAM: PORTABLE CHEST 1 VIEW COMPARISON:  09/01/2023 FINDINGS: Left pacer remains in place, unchanged. Heart is normal size. Aortic atherosclerosis. Bilateral lower lobe airspace opacities, slightly increased since prior study. No effusions or acute bony abnormality. IMPRESSION: Bilateral lower lobe atelectasis or infiltrates. Electronically Signed   By: Charlett Nose M.D.   On: 10/04/2023 18:27   IR Perc Cholecystostomy Result Date: 10/04/2023 INDICATION: Acute cholecystitis. Prior cholecystostomy drain placed 09/03/2023, dislodged 09/22/2023. EXAM: ULTRASOUND AND FLUOROSCOPIC-GUIDED CHOLECYSTOSTOMY TUBE PLACEMENT COMPARISON:  US Abdomen, 10/03/2023.  NM HIDA, 10/04/2023. MEDICATIONS: The patient is currently admitted to the hospital and on intravenous antibiotics. Antibiotics were administered within an appropriate time frame prior to skin puncture. ANESTHESIA/SEDATION: Moderate (conscious)  sedation was employed during this procedure. A total of Versed 2 mg and Fentanyl 100 mcg was administered intravenously. Moderate Sedation Time: 21 minutes. The patient's level of consciousness and vital signs were monitored continuously by radiology nursing throughout the procedure under my direct supervision. CONTRAST:  20mL OMNIPAQUE IOHEXOL 300 MG/ML SOLN - administered into the gallbladder fossa. FLUOROSCOPY TIME:  Fluoroscopic dose; 6 mGy COMPLICATIONS: None immediate. PROCEDURE: Informed written consent was obtained from the patient after a discussion of the risks, benefits and alternatives to treatment. Questions regarding the procedure were encouraged and answered. A timeout was performed prior to the initiation of the procedure. The right upper abdominal quadrant was prepped and draped in the usual sterile fashion, and a sterile drape was applied covering the operative field. Maximum barrier sterile technique with sterile gowns and gloves were used for the procedure. A timeout was performed prior to the initiation of the procedure. Local anesthesia was provided with 1% lidocaine with epinephrine. Ultrasound scanning of the RIGHT upper quadrant demonstrates a distended gallbladder. Utilizing a transhepatic approach, a 22 gauge needle was advanced into the gallbladder under direct ultrasound guidance. An ultrasound image was saved for documentation purposes. Appropriate intraluminal puncture was confirmed with the efflux of bile and advancement of an 0.018 wire into the gallbladder lumen. The needle was exchanged for an Accustick set. A small amount of contrast was injected to confirm appropriate intraluminal positioning. Over a Benson wire, a 10 Fr cholecystomy tube was advanced into the gallbladder fossa, coiled and locked. Bile was aspirated and a small amount of contrast was injected as several post procedural spot radiographic images were obtained in various obliquities. The catheter was secured to the  skin with suture, connected to a drainage bag and a dressing was placed. The patient tolerated the procedure well without immediate post procedural complication. IMPRESSION: Successful ultrasound and fluoroscopic guided placement of a 10 Fr cholecystostomy tube. RECOMMENDATIONS: The patient will return to V[facility]iology (VIR) for routine drainage catheter evaluation and exchange in 6-8 weeks. Roanna Banning, MD [facility] Electronically Signed   By: Roanna Banning M.D.   On: 10/04/2023 16:19   NM Hepatobiliary Liver Func Result Date: 10/04/2023 CLINICAL DATA:  Cholelithiasis and cholecystitis. Recent percutaneous cholecystostomy, which no longer in place. Internal biliary stent. Pancreatic carcinoma. EXAM: NUCLEAR MEDICINE HEPATOBILIARY IMAGING TECHNIQUE: Sequential images of the abdomen were obtained out to 60 minutes following intravenous administration of  radiopharmaceutical. Because no gallbladder activity was visualized at this time, 3 mg morphine was administered intravenously, and imaging was continued for another 30 minutes RADIOPHARMACEUTICALS:  5.3 mCi Tc-2m  Choletec IV COMPARISON:  None Available. FINDINGS: Prompt uptake and biliary excretion of activity by the liver is seen. Biliary activity promptly passes into small bowel, consistent with patent common bile duct. No gallbladder activity is visualized, either before or following administration of intravenous morphine, consistent with cystic duct obstruction. Reflux of biliary activity into the stomach is also noted. IMPRESSION: Absence of gallbladder activity, consistent with cystic duct obstruction. No evidence of biliary obstruction. Bile reflux noted. Electronically Signed   By: Danae Orleans M.D.   On: 10/04/2023 10:33   US Abdomen Limited RUQ (LIVER/GB) Result Date: 10/03/2023 CLINICAL DATA:  [id number] Thickening of wall of gallbladder [id number] Cholelithiasis [id number]  EXAM: ULTRASOUND ABDOMEN LIMITED RIGHT UPPER QUADRANT COMPARISON:  US Abdomen, 01/11/2023. CT of pelvis, 10/03/2023 and 09/01/2023. FINDINGS: Suboptimal evaluation, with poor acoustic penetration secondary to patient habitus. Gallbladder: Dependent echogenic gallstone, within a nondistended gallbladder, measuring up to 2.3 cm. Gallbladder wall thickening, measuring 0.6 cm with mild pericholecystic fluid. No sonographic Murphy sign noted by sonographer. Common bile duct: Diameter: Poorly visualized. Liver: Increased hepatic parenchymal echogenicity. Ovoid, hypoechoic RIGHT liver lesion measuring 2.2 x 1.4 x 1.5 cm without internal vascularity likely consistent with a cysts. Portal vein is patent on color Doppler imaging with normal direction of blood flow towards the liver. Other: No perihepatic fluid. IMPRESSION: 1. Nondistended gallbladder with gallstone, relative wall thickening and pericholecystic fluid. Examination is equivocal for calculus cholecystitis, consider NM HIDA for further evaluation if continued clinical concern. 2. Echogenic liver. Findings most commonly seen in hepatic steatosis, though may also represent hepatitis and/or fibrosis. Electronically Signed   By: Roanna Banning M.D.   On: 10/03/2023 14:54   CT ABDOMEN PELVIS W CONTRAST Result Date: 10/03/2023 CLINICAL DATA:  Abdominal pain, acute, nonlocalized pancreatic cancer, hx of bilary stent, pain worse in ruq Right lower quadrant abdominal pain for 3 days with nausea. Recently completed radiation therapy. EXAM: CT ABDOMEN AND PELVIS WITH CONTRAST TECHNIQUE: Multidetector CT imaging of the abdomen and pelvis was performed using the standard protocol following bolus administration of intravenous contrast. RADIATION DOSE REDUCTION: This exam was performed according to the departmental dose-optimization program which includes automated exposure control, adjustment of the mA and/or kV according to patient size and/or use of iterative reconstruction  technique. CONTRAST:  OMNIPAQUE IOHEXOL 300 MG/ML  SOLN COMPARISON:  Abdominopelvic CT 09/01/2023 and 08/14/2023. Abdominal radiographs 09/06/2023. FINDINGS: Lower chest: Chronic elevation of the left hemidiaphragm with associated chronic bibasilar atelectasis. The overall aeration of the left lung base has improved from the prior study. No significant pleural or pericardial effusion. Patient has a pacemaker. There is aortic and coronary artery atherosclerosis. Hepatobiliary: Grossly stable hepatic cysts. No suspicious liver lesions are identified. Metallic biliary stent is unchanged in position with persistent pneumobilia. Small gallstones, gallbladder wall thickening and pericholecystic inflammation, similar to previous CT. Since the prior CT, the patient had a cholecystostomy tube placed and removed. Pancreas: Grossly stable ill-defined 2.2 cm mass involving the pancreatic head on image 29/4. Persistent moderate pancreatic ductal dilatation. No acute peripancreatic fluid collections are identified. Spleen: Normal in size without focal abnormality. Adrenals/Urinary Tract: Both adrenal glands appear normal. No evidence of urinary tract calculus, suspicious renal lesion or hydronephrosis. Unchanged renal cysts for which no specific follow-up imaging is recommended. The bladder appears unremarkable for its degree of  distention. Stomach/Bowel: No enteric contrast was administered. A duodenal stent is in place (placed 09/05/2023) and extending from the distal stomach into the 2nd portion of the duodenum, grossly unchanged from reference prior radiographs. There is fluid density within the stent lumen without soft tissue components to suggest tumor ingrowth. The stomach remains decompressed with stable postsurgical changes proximally. No significant bowel distension, wall thickening or surrounding inflammation. The appendix appears normal. There is a patent distal colonic anastomosis. Vascular/Lymphatic: No  enlarged abdominopelvic lymph nodes are identified. Diffuse aortic and branch vessel atherosclerosis without evidence of aneurysm or acute large vessel occlusion. There is chronic venous occlusion at the portal splenic confluence. Reproductive: Prostate brachytherapy seeds. Other: Postsurgical changes in the anterior abdominal wall. Generalized subcutaneous and mesenteric edema has mildly progressed compared with the prior CT. No ascites, focal fluid collection or pneumoperitoneum demonstrated. Musculoskeletal: No acute or significant osseous findings. Multilevel spondylosis. IMPRESSION: 1. Cholelithiasis with gallbladder wall thickening and pericholecystic inflammation, similar to previous CT. Since the prior CT, the patient had a cholecystostomy tube placed and removed. Findings could reflect ongoing cholecystitis. 2. Interval duodenal stent placement with decompression of the stomach. 3. Grossly stable ill-defined pancreatic head mass with associated pancreatic ductal dilatation and chronic venous occlusion. No definite evidence of metastatic disease. 4. Metallic biliary stent remains in place with persistent pneumobilia. 5. Generalized subcutaneous and mesenteric edema has mildly progressed compared with the prior CT. 6.  Aortic Atherosclerosis (ICD10-I70.0). Electronically Signed   By: Carey Bullocks M.D.   On: 10/03/2023 12:23   IR Sinus/Fist Tube Chk-Non GI Result Date: 09/22/2023 INDICATION: Retracted cholecystostomy, no output EXAM: Fluoroscopic evaluation and injection of the existing cholecystostomy. Cholecystostomy removal MEDICATIONS: None. ANESTHESIA/SEDATION: None. COMPLICATIONS: None immediate. PROCEDURE: Informed written consent was obtained from the patient after a thorough discussion of the procedural risks, benefits and alternatives. All questions were addressed. Maximal Sterile Barrier Technique was utilized including caps, mask, sterile gowns, sterile gloves, sterile drape, hand hygiene and  skin antiseptic. A timeout was performed prior to the initiation of the procedure. Existing right upper quadrant cholecystostomy is clearly retracted. Injection confirms the catheter has retracted and is within the periphery of the right liver. Catheter was cut and removed. Kumpe catheter was advanced into the percutaneous tract. Contrast injection demonstrates no residual patent transhepatic tract to the gallbladder. Catheter and guidewire access removed. Access could not be re-established into the gallbladder. Following this, ultrasound of the right upper quadrant demonstrates no significant dilatation of the gallbladder. Cholelithiasis noted. Diffuse pneumobilia as before related to the known endoscopic biliary stent. IMPRESSION: Retracted cholecystostomy within the periphery of the right liver. No residual patent percutaneous transhepatic tract to reestablish gallbladder access. Therefore the cholecystostomy was removed. Right upper quadrant limited ultrasound demonstrates no significant dilatation of the gallbladder. Gallstones are noted. No sonographic Murphy's sign. Findings discussed with the patient and his son. They understand if he has recurrent right upper quadrant pain, fevers, nausea and vomiting he may require cholecystostomy replacement in the future. They are in agreement with this plan. Electronically Signed   By: Judie Petit.  Shick M.D.   On: 09/22/2023 17:16  Mr Adorno was interviewed and examined.  The sepsis syndrome appears to have resolved.  He is completing a course of antibiotics.  He complains of persistent pain at the right upper abdomen.  The pain may be related to the gallbladder inflammation, drain, or pancreas cancer. Mr Rorie decided to discontinue capecitabine and radiation when we saw him on 09/29/2023.  I think it  is unlikely further radiation will improve his pain.  His overall clinical status appears to be declining.  He will be a candidate for hospice care if this continues.  I  recommend discharge to home when pain is controlled with an oral narcotic regimen.  Recommendations: Continue antibiotics as recommended by the medical service Oxycodone as needed for pain Diet as tolerated Outpatient follow-up as scheduled at the Cancer center

## 2023-10-10 NOTE — TOC Progression Note (Signed)
 Transition of Care [facility]) - Progression Note    Patient Details  Name: Marcus [name] MRN: [id number] Date of Birth: June 18, 1941  [facility] CM/SW Contact  Beckie Busing, RN Phone [phone]  10/10/2023, 2:47 PM  Clinical Narrative:    CM at bedside to explain role and HH  therapy recommendation. Patient verbalized understanding and is agreeable to [facility] PT/OT. CM has provided patient with [email or web address]. list for choice. Patient has no preference. Home health referral has been called to Angie with [facility]. Awaiting confirmation of acceptance.      Barriers to Discharge: Continued Medical Work up  Expected Discharge Plan and Services In-house Referral: NA     Living arrangements for the past 2 months: Single Family Home                 DME Arranged: N/A DME Agency: NA       HH Arranged: NA HH Agency: NA         Social Determinants of Health (SDOH) Interventions SDOH Screenings   Food Insecurity: No Food Insecurity (10/03/2023)  Housing: Low Risk  (10/03/2023)  Transportation Needs: No Transportation Needs (10/03/2023)  Utilities: Not At Risk (10/03/2023)  Alcohol Screen: Low Risk  (08/22/2023)  Depression (PHQ2-9): High Risk (09/01/2023)  Financial Resource Strain: Low Risk  (07/28/2021)  Physical Activity: Inactive (07/28/2021)  Social Connections: Moderately Isolated (10/03/2023)  Stress: No Stress Concern Present (07/28/2021)  Tobacco Use: Medium Risk (10/03/2023)    Readmission Risk Interventions    10/05/2023   10:05 AM 09/04/2023    4:09 PM 01/11/2023    5:08 PM  Readmission Risk Prevention Plan  Transportation Screening Complete Complete Complete  Medication Review (RN Care Manager) Complete Complete Referral to Pharmacy  PCP or Specialist appointment within 3-5 days of discharge Complete Complete Complete  HRI or Home Care Consult Complete Complete Complete  SW Recovery Care/Counseling Consult Complete Complete Complete  Palliative Care  Screening Not Applicable Not Applicable Not Applicable  Skilled Nursing Facility Not Applicable Not Applicable Not Applicable

## 2023-10-10 NOTE — Evaluation (Signed)
 Occupational Therapy Evaluation Patient Details Name: Marcus [name] MRN: [id number] DOB: Nov 06, 1940 Today's Date: 10/10/2023   History of Present Illness   Pt is 83 yo male admitted on 10/03/23 with recurrent abdominal pain and admitted with septic shock due to acute cholecystitis/cystic duct obstruction. Pt s/p IR perc cholecystectomy tube placement on 4/1.  Pt with hypotension during admission requiring tx to ICU for pressors - now weaned. Pt with hx including but not limited to pancreatic CA, afib, HTN, CAD, gout, prostate CA, OSA, L TKA, pacemaker, recent cholecystitis with cholecystostomy tube 09/03/23 removed  on 09/22/23.     Clinical Impressions PTA, patient was lving with his son and relatively independent with intermittent use of rollator as needed including ADL's some IADL's and driving. Currently, patient presents with some pain limiting mobility and activity tolerance, generalized weakness, higher level balance deficits impacting BADL's and functional mobility. Patient was able to complete his am self care routine with intermittent seated rest breaks and compensatory and ECT strategy training. Patient reports he has all necessary DME at home but would benefit from a reacher that he reports his daughter can purchase. OT recommending continued Acute care OT services and family support and assistance with [facility] services upon discharge.      If plan is discharge home, recommend the following:   A little help with walking and/or transfers;A little help with bathing/dressing/bathroom;Assistance with cooking/housework;Direct supervision/assist for medications management;Direct supervision/assist for financial management;Assist for transportation;Help with stairs or ramp for entrance     Functional Status Assessment   Patient has had a recent decline in their functional status and demonstrates the ability to make significant improvements in function in a reasonable and predictable amount of  time.     Equipment Recommendations   Other (comment) Lexicographer)      Precautions/Restrictions   Precautions Precautions: Fall Precaution/Restrictions Comments: cholecystectomy tube Restrictions Weight Bearing Restrictions Per Provider Order: No     Mobility Bed Mobility               General bed mobility comments: up in recliner for session    Transfers Overall transfer level: Needs assistance Equipment used: Rolling walker (2 wheels) Transfers: Sit to/from Stand, Bed to chair/wheelchair/BSC Sit to Stand: Supervision     Step pivot transfers: Supervision     General transfer comment: BP taken after standing and amb 112/62      Balance   Sitting-balance support: No upper extremity supported, Feet supported       Standing balance support: No upper extremity supported, Bilateral upper extremity supported, During functional activity Standing balance-Leahy Scale: Fair                             ADL either performed or assessed with clinical judgement   ADL Overall ADL's : Needs assistance/impaired Eating/Feeding: Independent   Grooming: Wash/dry hands;Wash/dry face;Oral care;Applying deodorant;Independent   Upper Body Bathing: Independent;Sitting   Lower Body Bathing: Contact guard assist;Sitting/lateral leans;Sit to/from stand   Upper Body Dressing : Independent;Sitting   Lower Body Dressing: Contact guard assist   Toilet Transfer: Supervision/safety;Ambulation;Rolling walker (2 wheels);Grab bars   Toileting- Clothing Manipulation and Hygiene: Supervision/safety       Functional mobility during ADLs: Supervision/safety (lines management as well) General ADL Comments: limited activity tolerance due to pain     Vision Baseline Vision/History: 1 Wears glasses;0 No visual deficits Ability to See in Adequate Light: 0 Adequate Patient Visual Report: No change from  baseline Vision Assessment?: Wears glasses for reading;No apparent  visual deficits     Perception Perception: Within Functional Limits       Praxis Praxis: WFL       Pertinent Vitals/Pain Pain Assessment Pain Assessment: 0-10 Pain Score: 7  Pain Location: R flank- fleeting and intense Pain Descriptors / Indicators: Guarding, Shooting, Sore Pain Intervention(s): Limited activity within patient's tolerance, Monitored during session, Repositioned, Premedicated before session     Extremity/Trunk Assessment Upper Extremity Assessment Upper Extremity Assessment: Right hand dominant;Generalized weakness   Lower Extremity Assessment Lower Extremity Assessment: Defer to PT evaluation   Cervical / Trunk Assessment Cervical / Trunk Assessment: Normal   Communication Communication Communication: No apparent difficulties   Cognition Arousal: Alert Behavior During Therapy: WFL for tasks assessed/performed                                 Following commands: Intact       Cueing  General Comments   Cueing Techniques: Verbal cues  able to figure 4 for LB clothing management with only minimal pain, recommending a reacher and patient reports his dtr can purchase           Home Living Family/patient expects to be discharged to:: Private residence Living Arrangements: Children Available Help at Discharge: Family;Available 24 hours/day Type of Home: House Home Access: Stairs to enter [employer] of Steps: 3 Entrance Stairs-Rails: Left Home Layout: One level     Bathroom Shower/Tub: Walk-in shower;Door   [employer] Toilet: Handicapped height Bathroom Accessibility: Yes How Accessible: Accessible via wheelchair;Accessible via walker Home Equipment: Cane - single point;Shower seat - built in;Grab bars - tub/shower;Hand held Stage manager (4 wheels)          Prior Functioning/Environment Prior Level of Function : Independent/Modified Independent             Mobility Comments: Uses rollator more lately  but still cane at times ADLs Comments: Pt reports independent and driving    OT Problem List: Decreased activity tolerance;Impaired balance (sitting and/or standing);Pain   OT Treatment/Interventions: Self-care/ADL training;Therapeutic exercise;Energy conservation;DME and/or AE instruction;Therapeutic activities;Patient/family education;Balance training      OT Goals(Current goals can be found in the care plan section)   Acute Rehab OT Goals Patient Stated Goal: to get stronger OT Goal Formulation: With patient Time For Goal Achievement: 10/24/23 Potential to Achieve Goals: Good ADL Goals Pt Will Perform Lower Body Bathing: with modified independence;sitting/lateral leans;sit to/from stand Pt Will Perform Lower Body Dressing: with modified independence;sitting/lateral leans;sit to/from stand;with adaptive equipment Pt Will Transfer to Toilet: with modified independence;grab bars;ambulating Pt/caregiver will Perform Home Exercise Program: With written HEP provided;Increased strength;Independently   OT Frequency:  Min 1X/week    Co-evaluation              AM-PAC OT "6 Clicks" Daily Activity     Outcome Measure Help from another person eating meals?: None Help from another person taking care of personal grooming?: None Help from another person toileting, which includes using toliet, bedpan, or urinal?: None Help from another person bathing (including washing, rinsing, drying)?: A Little Help from another person to put on and taking off regular upper body clothing?: A Little Help from another person to put on and taking off regular lower body clothing?: A Little 6 Click Score: 21   End of Session Equipment Utilized During Treatment: Gait belt;Rolling walker (2 wheels) Nurse Communication: Mobility status  Activity  Tolerance: Patient limited by pain Patient left: in chair;with call bell/phone within reach;with chair alarm set  OT Visit Diagnosis: Unsteadiness on feet  (R26.81);Pain                Time: 1231-1310 OT Time Calculation (min): 39 min Charges:  OT General Charges $OT Visit: 1 Visit OT Evaluation $OT Eval Moderate Complexity: 1 Mod OT Treatments $Self Care/Home Management : 23-37 mins Christian Treadway OT/L Acute Rehabilitation Department  [phone]  10/10/2023, 1:34 PM

## 2023-10-10 NOTE — Progress Notes (Signed)
 PT Cancellation Note  Patient Details Name: Marcus [name] MRN: [id number] DOB: 12-Dec-1940   Cancelled Treatment:    Reason Eval/Treat Not Completed: Other (comment)  Pt declined PT this morning due to too fatigued.  Tried to encourage to do what able but he stated not a good time and request afternoon.  Will f/u as able.  Anise Salvo, PT Acute Rehab [facility] [phone]  Rayetta Humphrey 10/10/2023, 10:12 AM

## 2023-10-10 NOTE — Progress Notes (Signed)
 Initial Nutrition Assessment  DOCUMENTATION CODES:   Severe malnutrition in context of chronic illness  INTERVENTION:   -Ensure Plus High Protein po TID, each supplement provides 350 kcal and 20 grams of protein.   -Multivitamin with minerals daily  -Magic cup TID with meals, each supplement provides 290 kcal and 9 grams of protein   -Consider speech evaluation if difficulty swallowing persists  NUTRITION DIAGNOSIS:   Severe Malnutrition related to chronic illness, cancer and cancer related treatments as evidenced by severe fat depletion, severe muscle depletion, percent weight loss.  GOAL:   Patient will meet greater than or equal to 90% of their needs  MONITOR:   PO intake, Supplement acceptance  REASON FOR ASSESSMENT:   Consult Assessment of nutrition requirement/status  ASSESSMENT:   83 yo male with PMH pancreatic cancer, A-fib, HTN, CAD, gout, OSA, prostate cancer who presented with recurrent abdominal pain who had recently been diagnosed with acute cholecystitis and underwent cholecystostomy tube placement on 09/03/2023, recent duodenal stent placed during that hospitalization due to gastric outlet obstruction and on 09/22/2023 his cholecystostomy tube was removed by radiology after found to be displaced who presented with recurrent symptoms-abdominal pain nausea vomiting x 3 days no fever.  CT a/p, RUQ [place] and HIDA scan with findings of ongoing cholecystitis and recurrent cystic duct obstruction  Patient in room, daughter at bedside. Pt reports his main issue is poor appetite. Does not like the hospital food. Later reports he does have some trouble with swallowing as well. Has to take his time eating, which has limited how much he can eat. Has tried to drink Ensure at home, can only get 1 down daily. States he will try to drink more while he is here. Enjoys Magic cups, will order vanilla for every tray.  Has been taking a daily MVI at home, will resume here as well.   Per  weight records, pt has lost 33 lbs since 2/3 (15% wt loss x 2 months, significant for time frame).   Medications: Miralax  Labs reviewed.  NUTRITION - FOCUSED PHYSICAL EXAM:  Flowsheet Row Most Recent Value  Orbital Region Severe depletion  Upper Arm Region Severe depletion  Thoracic and Lumbar Region Unable to assess  Buccal Region Severe depletion  Temple Region Moderate depletion  Clavicle Bone Region Severe depletion  Clavicle and Acromion Bone Region Severe depletion  Scapular Bone Region Severe depletion  Dorsal Hand Severe depletion  Patellar Region Unable to assess  Anterior Thigh Region Unable to assess  Posterior Calf Region Unable to assess  Edema (RD Assessment) Mild  [BLE]  Hair Reviewed  Eyes Reviewed  Mouth Reviewed  Skin Reviewed  Nails Reviewed       Diet Order:   Diet Order             Diet regular Room service appropriate? Yes; Fluid consistency: Thin  Diet effective now                   EDUCATION NEEDS:   Education needs have been addressed  Skin:  Skin Assessment: Reviewed RN Assessment  Last BM:  4/7 -type 2  Height:   Ht Readings from Last 1 Encounters:  10/03/23 5\' 8"  (1.727 m)    Weight:   Wt Readings from Last 1 Encounters:  10/03/23 83.9 kg   BMI:  Body mass index is 28.13 kg/m.  Estimated Nutritional Needs:   Kcal:  2100-2300  Protein:  100-110g  Fluid:  2.1L/day   Tilda Franco, MS,  RD, LDN Inpatient Clinical Dietitian Contact via Secure chat

## 2023-10-11 ENCOUNTER — Other Ambulatory Visit (HOSPITAL_COMMUNITY): Payer: Self-pay

## 2023-10-11 ENCOUNTER — Ambulatory Visit

## 2023-10-11 ENCOUNTER — Ambulatory Visit: Payer: Non-veteran care

## 2023-10-11 ENCOUNTER — Telehealth: Payer: Self-pay

## 2023-10-11 DIAGNOSIS — K810 Acute cholecystitis: Secondary | ICD-10-CM | POA: Diagnosis not present

## 2023-10-11 LAB — CBC
HCT: 30.1 % — ABNORMAL LOW (ref 39.0–52.0)
Hemoglobin: 9.1 g/dL — ABNORMAL LOW (ref 13.0–17.0)
MCH: 26.6 pg (ref 26.0–34.0)
MCHC: 30.2 g/dL (ref 30.0–36.0)
MCV: 88 fL (ref 80.0–100.0)
Platelets: 148 10*3/uL — ABNORMAL LOW (ref 150–400)
RBC: 3.42 MIL/uL — ABNORMAL LOW (ref 4.22–5.81)
RDW: 19.1 % — ABNORMAL HIGH (ref 11.5–15.5)
WBC: 3.6 10*3/uL — ABNORMAL LOW (ref 4.0–10.5)
nRBC: 0 % (ref 0.0–0.2)

## 2023-10-11 MED ORDER — LEVOFLOXACIN 750 MG PO TABS
750.0000 mg | ORAL_TABLET | ORAL | 0 refills | Status: AC
  Filled 2023-10-11: qty 7, 14d supply, fill #0

## 2023-10-11 MED ORDER — HEPARIN SOD (PORK) LOCK FLUSH 100 UNIT/ML IV SOLN
500.0000 [IU] | Freq: Once | INTRAVENOUS | Status: AC
  Administered 2023-10-11: 500 [IU] via INTRAVENOUS
  Filled 2023-10-11: qty 5

## 2023-10-11 MED ORDER — METRONIDAZOLE 500 MG PO TABS
500.0000 mg | ORAL_TABLET | Freq: Two times a day (BID) | ORAL | 0 refills | Status: AC
  Filled 2023-10-11: qty 26, 13d supply, fill #0

## 2023-10-11 NOTE — Telephone Encounter (Signed)
 RN called and spoke with [name] identity confirmed x 2 to inform him that we will discontinue treatments per Dr. Truett Perna if he was ok with that.  Marcus [name] was agreeable with ending treatments and appreciated the call.

## 2023-10-11 NOTE — Plan of Care (Signed)

## 2023-10-11 NOTE — Discharge Summary (Signed)
 Physician Discharge Summary  Marcus [name] ZOX:[id number] DOB: Dec 11, 1940 DOA: 10/03/2023  PCP: [name], NP  Admit date: 10/03/2023 Discharge date: 10/11/2023 Recommendations for Outpatient Follow-up:  Follow up with PCP in 1 weeks-call for appointment Please obtain BMP/CBC in one week  Discharge Dispo: Home Discharge Condition: Stable Code Status:   Code Status: Limited: Do not attempt resuscitation (DNR) -DNR-LIMITED -Do Not Intubate/DNI  Diet recommendation:  Diet Order             Diet regular Room service appropriate? Yes; Fluid consistency: Thin  Diet effective now                    Brief/Interim Summary: 83 yo male with PMH pancreatic cancer, A-fib, HTN, CAD, gout, OSA, prostate cancer who presented with recurrent abdominal pain who had recently been diagnosed with acute cholecystitis and underwent cholecystostomy tube placement on 09/03/2023, recent duodenal stent placed during that hospitalization due to gastric outlet obstruction and on 09/22/2023 his cholecystostomy tube was removed by radiology after found to be displaced who presented with recurrent symptoms-abdominal pain nausea vomiting x 3 days no fever. CT a/p, RUQ [place] and HIDA scan with findings of ongoing cholecystitis and recurrent cystic duct obstruction S/p IR placement of percutaneous cholecystostomy tube and patient with progression to hypotension, PCCM consulted 4/2,> transferred to [facility] for pressors.  Patient had been weaned off pressors to midodrine.  Culture drain polymicrobial-being managed with IV antibiotics, IR following for drain. 10/09/23> transferred to [facility] and continued on antibiotics,. At this time vitals stable tolerating diet,  once cleared by oncology will discharge home with home health on oral antibiotics with follow-up with drain clinic.   Subjective: Alert awake oriented Resting comfortably labs this morning with improving WBC count at 3.6 hemoglobin 9.1 platelet 148 Overnight afebrile  BP stable Labs stable   Assessment and plan:  Septic shock due to acute cholecystitis/cystic duct obstruction Chronic cholecystitis w/ prior cholecystostomy  tube on 09/03/23- dislodged S/p IR Perc cholecystectomy tube placement 4/1 Possible RLL pneumonia CT abd 10/04/23 -interval cholecystostomy catheter placement with decompression of the gallbladder, no other significant finding.  Some atelectasis, air bronchograms at lower lobe and pancreatic mass. Weaned off pressors-BP stable patient refusing midodrine will discontinue.Blood culture NGTD. Biliary culture-polymicrobial-Klebsiella, Streptococcus, Enterococcus faecalis cont Cefepime/metronidazole 4/5>> discussed with ID pharmacy recommending Levaquin upon discharge will discussed with ID Dr Elinor Parkinson Ivy Lynn to transition antibiotics Flagyl Levaquin x 3 weeks from date of tube placement  Abdominal pain: Likely in the setting of pancreatic malignancy versus cholecystitis. Continue pain management  AKI Metabolic acidosis: Peaked to 1.9. and now resolved.  Encourage oral hydration Recent Labs  Lab 10/06/23 0814 10/07/23 0828 10/08/23 0413 10/09/23 0356 10/10/23 0500  BUN 34* 33* 28* 25* 21  CREATININE 1.95* 1.54* 1.32* 1.19 1.18    BPH Cont flomax/Ditropan   Hypertension CHFrEF: PTA on amlodipine/valsartan hydrochlorothiazide- all his antihypertensive on hold. Echo 4/30 shows EF 35 40%  ( previous 45-50% in 12/14/22), G1 DD GHK RV SF normal and RV size normal LA moderately dilated-  Leukopenia likely from sepsis Thrombocytopenia due to sepsis: Anemia of malignancy. WBC count, platelet count trending up hemoglobin at 9.1 g.  Overall stable  Recent Labs  Lab 10/09/23 0356 10/10/23 0500 10/11/23 0920  HGB 8.3* 8.5* 9.1*  HCT 26.3* 27.6* 30.1*  WBC 1.8* 2.3* 3.6*  PLT 100* 113* 148*    Recently on prednisone:  Patient reported not taking it we will discuss due to [facility] tomorrow abt  this- he prescribed that back in March  2  continue PT OT. ?Long-term prednisone he is not sure-was palced by Dr Melody Comas Dr. Truett Perna it was a short course and okay to hold off   Atrial fibrillation: Currently AV paced. Eliquis remains on hold since 3/30-oncology and IR okay for resumption. Patient was tolerating Eliquis well prior to admission resume Eliquis on discahrge and he is advised to follow-up with cardiology to discuss risk benefits  longterm   Pancreatic adenocarcinoma Goals of care discussion: Followed by Dr. Myrle Sheng. As of 3/27 patient has decided to hold on further radiation and Xeloda treatments He is DNR DNI  Failure to thrive Severe protein calorie malnutrition: Augment diet, has lost about 35 pounds since January.  Dietitian consulted Deconditioning debility will continue with PT OT home health.   Discharge Exam: Vitals:   10/10/23 1652 10/11/23 0554  BP: 111/71 119/69  Pulse:  (!) 59  Resp:  20  Temp:  98.1 F (36.7 C)  SpO2:  96%   General: Pt is alert, awake, not in acute distress Cardiovascular: RRR, S1/S2 +, no rubs, no gallops Respiratory: CTA bilaterally, no wheezing, no rhonchi Abdominal: Soft, NT, ND, bowel sounds + Extremities: no edema, no cyanosis  Discharge Instructions  Discharge Instructions     Discharge instructions   Complete by: As directed    Please call call MD or return to ER for similar or worsening recurring problem that brought you to hospital or if any fever,nausea/vomiting,abdominal pain, uncontrolled pain, chest pain,  shortness of breath or any other alarming symptoms.  Please follow-up your doctor as instructed in a week time and call the office for appointment.  Please avoid alcohol, smoking, or any other illicit substance and maintain healthy habits including taking your regular medications as prescribed.  You were cared for by a hospitalist during your hospital stay. If you have any questions about your discharge medications or the care you received while  you were in the hospital after you are discharged, you can call the unit and ask to speak with the hospitalist on call if the hospitalist that took care of you is not available.  Once you are discharged, your primary care physician will handle any further medical issues. Please note that NO REFILLS for any discharge medications will be authorized once you are discharged, as it is imperative that you return to your primary care physician (or establish a relationship with a primary care physician if you do not have one) for your aftercare needs so that they can reassess your need for medications and monitor your lab values   Increase activity slowly   Complete by: As directed    No wound care   Complete by: As directed       Allergies as of 10/11/2023   No Known Allergies      Medication List     STOP taking these medications    amLODipine 10 MG tablet Commonly known as: NORVASC   predniSONE 10 MG tablet Commonly known as: DELTASONE   valsartan-hydrochlorothiazide 160-25 MG tablet Commonly known as: DIOVAN-HCT       TAKE these medications    acetaminophen 325 MG tablet Commonly known as: TYLENOL Take 2 tablets (650 mg total) by mouth every 6 (six) hours as needed for mild pain (or Fever >/= 101). What changed: reasons to take this   capecitabine 500 MG tablet Commonly known as: XELODA Take 3 tablets (1,500 mg total) by mouth 2 (two) times daily after a  meal. Take Monday-Friday. Take only days of radiation.   cyanocobalamin 500 MCG tablet Commonly known as: VITAMIN B12 Take 1 tablet by mouth 2 (two) times daily.   Eliquis 5 MG Tabs tablet Generic drug: apixaban Take 1 tablet (5 mg total) by mouth 2 (two) times daily.   Eszopiclone 3 MG Tabs Commonly known as: eszopiclone Take 1 tablet (3 mg total) by mouth at bedtime. Take immediately before bedtime. What changed:  when to take this reasons to take this additional instructions   levofloxacin 750 MG  tablet Commonly known as: Levaquin Take 1 tablet (750 mg total) by mouth every other day for 13 days.   LORazepam 0.5 MG tablet Commonly known as: ATIVAN Take 1 tablet (0.5 mg total) by mouth every 8 (eight) hours as needed for anxiety.   metroNIDAZOLE 500 MG tablet Commonly known as: Flagyl Take 1 tablet (500 mg total) by mouth 2 (two) times daily for 13 days.   oxybutynin 5 MG tablet Commonly known as: DITROPAN Take 5 mg by mouth 2 (two) times daily as needed for bladder spasms (urinary incontinence).   oxyCODONE 5 MG immediate release tablet Commonly known as: Roxicodone Take 1 tablet (5 mg total) by mouth every 6 (six) hours as needed for severe pain (pain score 7-10).   tamsulosin 0.4 MG Caps capsule Commonly known as: FLOMAX Take 1 capsule by mouth daily.        Follow-up Information     [facility] [place], [place] Follow up.   Why: Your home heaalth has been set up with [facility]. The office will call you with start of service information. If you have any questions or concerns please call number listed above. Contact information: [address]ure[address] [place] [address] [phone]                No Known Allergies  The results of significant diagnostics from this hospitalization (including imaging, microbiology, ancillary and laboratory) are listed below for reference.    Microbiology: Recent Results (from the past 240 hours)  Aerobic/Anaerobic Culture w Gram Stain (surgical/deep wound)     Status: None   Collection Time: 10/04/23  3:04 PM   Specimen: BILE  Result Value Ref Range Status   Specimen Description   Final    BILE Performed at [facility], [address] [place], [place] [address]    Special Requests   Final    NONE Performed at [facility], [address] [place], [place] [address]    Gram Stain   Final    FEW WBC PRESENT,BOTH PMN AND MONONUCLEAR ABUNDANT GRAM  POSITIVE COCCI MODERATE GRAM NEGATIVE RODS RARE GRAM POSITIVE RODS    Culture   Final    Multiple bacterial morphotypes present, none predominant. Suggest appropriate recollection if clinically indicated. MIXED ANAEROBIC FLORA PRESENT.  CALL LAB IF FURTHER IID REQUIRED. NO STAPHYLOCOCCUS AUREUS ISOLATED NO GROUP A STREP (S.PYOGENES) ISOLATED Performed at [facility], [address], [place], [place] [address]    Report Status 10/07/2023 FINAL  Final  MRSA Next Gen by PCR, Nasal     Status: None   Collection Time: 10/04/23  6:31 PM   Specimen: Nasal Mucosa; Nasal Swab  Result Value Ref Range Status   MRSA by PCR Next Gen NOT DETECTED NOT DETECTED Final    Comment: (NOTE) The GeneXpert MRSA Assay (FDA approved for NASAL specimens only), is one component of a comprehensive MRSA  colonization surveillance program. It is not intended to diagnose MRSA infection nor to guide or monitor treatment for MRSA infections. Test performance is not FDA approved in patients less than 31 years old. Performed at [facility], [address] [place], [place] [address]   Culture, blood (Routine X 2) w Reflex to ID Panel     Status: None   Collection Time: 10/05/23  4:17 PM   Specimen: BLOOD LEFT ARM  Result Value Ref Range Status   Specimen Description   Final    BLOOD LEFT ARM Performed at [facility], [address], [place], [place] [address]    Special Requests   Final    BOTTLES DRAWN AEROBIC AND ANAEROBIC Blood Culture adequate volume Performed at [facility], [address] [place], [place] [address]    Culture   Final    NO GROWTH 5 DAYS Performed at [facility], [address], [place], [place] [address]    Report Status 10/10/2023 FINAL  Final  Culture, blood (Routine X 2) w Reflex to ID Panel     Status: None   Collection Time: 10/05/23  4:22 PM   Specimen: BLOOD LEFT ARM  Result Value Ref Range Status   Specimen  Description   Final    BLOOD LEFT ARM Performed at [facility], [address], [place], [place] [address]    Special Requests   Final    BOTTLES DRAWN AEROBIC AND ANAEROBIC Blood Culture results may not be optimal due to an inadequate volume of blood received in culture bottles Performed at [facility], [address] [place], [place] [address]    Culture   Final    NO GROWTH 5 DAYS Performed at [facility], [address] [place], [place] [address]    Report Status 10/10/2023 FINAL  Final    Procedures/Studies: ECHOCARDIOGRAM COMPLETE Result Date: 10/06/2023    ECHOCARDIOGRAM REPORT   Patient Name:   CAULDER WEHNER Date of Exam: 10/06/2023 Medical Rec #:  [id number]       Height:       68.0 in Accession #:    [id number]      Weight:       185.0 lb Date of Birth:  09-07-1940       BSA:          1.977 m Patient Age:    82 years        BP:           124/45 mmHg Patient Gender: M               HR:           63 bpm. Exam Location:  Inpatient Procedure: 2D Echo, Color Doppler, Cardiac Doppler and Intracardiac            Opacification Agent (Both Spectral and Color Flow Doppler were            utilized during procedure). Indications:    Shock  History:        Patient has prior history of Echocardiogram examinations, most                 recent 12/14/2022. CHF, COPD, Arrythmias:AV Block; Risk                 Factors:Sleep Apnea, Former Smoker and Hypertension.  Sonographer:    Lamont Snowball Referring Phys: [phone] A CALDWELL POWELL  JR  Sonographer Comments: Technically difficult study due to poor echo windows. IMPRESSIONS  1. Left ventricular ejection fraction, by estimation, is 35 to 40%. Left ventricular ejection fraction by 2D MOD biplane is 35.5 %. The left ventricle has moderately decreased function. The left ventricle demonstrates global hypokinesis. Left ventricular diastolic parameters are consistent with Grade I diastolic dysfunction (impaired relaxation).  2. Right  ventricular systolic function is normal. The right ventricular size is normal.  3. Left atrial size was moderately dilated.  4. Right atrial size was moderately dilated.  5. The mitral valve is abnormal. Trivial mitral valve regurgitation.  6. The aortic valve is tricuspid. Aortic valve regurgitation is not visualized. Aortic valve sclerosis/calcification is present, without any evidence of aortic stenosis. Aortic valve mean gradient measures 8.7 mmHg.  7. Aortic dilatation noted. There is mild dilatation of the ascending aorta, measuring 40 mm. Comparison(s): Changes from prior study are noted. 12/14/2022: LVEF 45-50%. FINDINGS  Left Ventricle: No apical thrombus with Definity contrast. Left ventricular ejection fraction, by estimation, is 35 to 40%. Left ventricular ejection fraction by 2D MOD biplane is 35.5 %. The left ventricle has moderately decreased function. The left ventricle demonstrates global hypokinesis. Definity contrast agent was given IV to delineate the left ventricular endocardial borders. The left ventricular internal cavity size was normal in size. There is no left ventricular hypertrophy. Abnormal (paradoxical) septal motion, consistent with RV pacemaker. Left ventricular diastolic parameters are consistent with Grade I diastolic dysfunction (impaired relaxation). Indeterminate filling pressures. Right Ventricle: The right ventricular size is normal. No increase in right ventricular wall thickness. Right ventricular systolic function is normal. Left Atrium: Left atrial size was moderately dilated. Right Atrium: Right atrial size was moderately dilated. Pericardium: There is no evidence of pericardial effusion. Mitral Valve: The mitral valve is abnormal. There is mild calcification of the anterior and posterior mitral valve leaflet(s). Trivial mitral valve regurgitation. MV peak gradient, 5.2 mmHg. The mean mitral valve gradient is 2.0 mmHg. Tricuspid Valve: The tricuspid valve is grossly normal.  Tricuspid valve regurgitation is trivial. Aortic Valve: The aortic valve is tricuspid. Aortic valve regurgitation is not visualized. Aortic valve sclerosis/calcification is present, without any evidence of aortic stenosis. Aortic valve mean gradient measures 8.7 mmHg. Aortic valve peak gradient measures 15.5 mmHg. Aortic valve area, by VTI measures 2.12 cm. Pulmonic Valve: The pulmonic valve was normal in structure. Pulmonic valve regurgitation is not visualized. Aorta: Aortic dilatation noted. There is mild dilatation of the ascending aorta, measuring 40 mm. Venous: The inferior vena cava was not well visualized. IAS/Shunts: No atrial level shunt detected by color flow Doppler. Additional Comments: A device lead is visualized.  LEFT VENTRICLE PLAX 2D                        Biplane EF (MOD) LVIDd:         4.60 cm         LV Biplane EF:   Left LVIDs:         2.60 cm                          ventricular LV PW:         0.80 cm                          ejection LV IVS:        0.90 cm  fraction by LVOT diam:     2.20 cm                          2D MOD LV SV:         81                               biplane is LV SV Index:   41                               35.5 %. LVOT Area:     3.80 cm                                Diastology                                LV e' medial:    9.79 cm/s LV Volumes (MOD)               LV E/e' medial:  5.4 LV vol d, MOD    129.0 ml      LV e' lateral:   9.14 cm/s A2C:                           LV E/e' lateral: 5.8 LV vol d, MOD    131.0 ml A4C: LV vol s, MOD    68.3 ml A2C: LV vol s, MOD    96.7 ml A4C: LV SV MOD A2C:   60.7 ml LV SV MOD A4C:   131.0 ml LV SV MOD BP:    45.9 ml RIGHT VENTRICLE RV Basal diam:  4.00 cm RV S prime:     13.70 cm/s TAPSE (M-mode): 2.3 cm LEFT ATRIUM             Index        RIGHT ATRIUM           Index LA Vol (A2C):   99.7 ml 50.43 ml/m  RA Area:     25.70 cm LA Vol (A4C):   75.4 ml 38.14 ml/m  RA Volume:   88.80 ml  44.91 ml/m LA  Biplane Vol: 89.1 ml 45.07 ml/m  AORTIC VALVE AV Area (Vmax):    2.20 cm AV Area (Vmean):   2.29 cm AV Area (VTI):     2.12 cm AV Vmax:           197.11 cm/s AV Vmean:          139.577 cm/s AV VTI:            0.383 m AV Peak Grad:      15.5 mmHg AV Mean Grad:      8.7 mmHg LVOT Vmax:         114.00 cm/s LVOT Vmean:        84.100 cm/s LVOT VTI:          0.214 m LVOT/AV VTI ratio: 0.56  AORTA Ao Root diam: 3.50 cm Ao Asc diam:  4.00 cm MITRAL VALVE MV Area (PHT): 1.60 cm     SHUNTS MV Area VTI:   2.58 cm     Systemic VTI:  0.21 m MV Peak grad:  5.2 mmHg     Systemic Diam: 2.20 cm MV Mean grad:  2.0 mmHg MV Vmax:       1.14 m/s MV Vmean:      59.9 cm/s MV Decel Time: 474 msec MV E velocity: 53.10 cm/s MV A velocity: 101.00 cm/s MV E/A ratio:  0.53 Zoila Shutter MD Electronically signed by Zoila Shutter MD Signature Date/Time: 10/06/2023/12:15:34 PM    Final    CT ABDOMEN PELVIS W CONTRAST Result Date: 10/05/2023 CLINICAL DATA:  Abdominal pain, acute, nonlocalized post cholecystostomy catheter placement procedure pain EXAM: CT ABDOMEN AND PELVIS WITH CONTRAST TECHNIQUE: Multidetector CT imaging of the abdomen and pelvis was performed using the standard protocol following bolus administration of intravenous contrast. RADIATION DOSE REDUCTION: This exam was performed according to the departmental dose-optimization program which includes automated exposure control, adjustment of the mA and/or kV according to patient size and/or use of iterative reconstruction technique. CONTRAST:  75mL OMNIPAQUE IOHEXOL 300 MG/ML  SOLN COMPARISON:  10/03/2023 FINDINGS: Lower chest: Small pleural effusions right greater than left have developed. No pneumothorax. Patchy consolidation/atelectasis with air bronchograms posteriorly in the lung bases, new since previous. Hepatobiliary: Interval transhepatic pigtail cholecystostomy catheter placement, well position, with decompression of the gallbladder. Persistent gallbladder wall  thickening in some regional inflammatory/edematous change. Metallic stent in the CBD stable, with pneumobilia implying patency of the stent. Stable scattered hepatic cysts, largest 1.6 cm in the posterior right lobe. Pancreas: Marked pancreatic ductal dilatation and parenchymal atrophy as before, with poor delineation of the pancreatic head mass, nearly encircling the SMV and abutting the SMA. No new peripancreatic fluid collections. Spleen: Normal in size without focal abnormality. Adrenals/Urinary Tract: No adrenal lesion. 6 mm linear calcification in the right renal hilum probably atherosclerotic. No definite urolithiasis. No hydronephrosis or worrisome renal lesion. The urinary bladder is nondistended. Stomach/Bowel: Post gastroplasty. Patent stent to the second portion of the duodenum. Distal duodenum is physiologically distended. Small bowel is nondilated, with incomplete distal passage of the oral contrast material. Normal appendix. The colon is partially distended, without acute finding. Anastomotic staple line in the distal sigmoid segment. Vascular/Lymphatic: Moderate scattered aortoiliac calcified plaque without aneurysm. Narrowing of the distal superior mesenteric vein and proximal main portal vein; intrahepatic portal venous branches patent. No abdominal or pelvic adenopathy localized. Reproductive: Multiple metallic seeds in the prostate. Other: Small volume pelvic ascites, new since previous. No free air. Musculoskeletal: Post ventral hernia repair. Vertebral endplate spurring at multiple levels in the lower thoracic spine. Multilevel lumbar spondylitic change with fusion L3-L5. Chronic appearing sacral fracture deformity. IMPRESSION: 1. Interval cholecystostomy catheter placement, with decompression of the gallbladder, no hematoma or other apparent complication. 2. New small volume pelvic ascites. 3. New small pleural effusions right greater than left, with patchy consolidation/atelectasis  posteriorly in the lung bases. 4. Stable pancreatic head mass with pancreatic ductal dilatation, parenchymal atrophy, encasement of the portosplenic confluence. Electronically Signed   By: Corlis Leak M.D.   On: 10/05/2023 15:01   DG CHEST PORT 1 VIEW Result Date: 10/04/2023 CLINICAL DATA:  Hypoxia EXAM: PORTABLE CHEST 1 VIEW COMPARISON:  09/01/2023 FINDINGS: Left pacer remains in place, unchanged. Heart is normal size. Aortic atherosclerosis. Bilateral lower lobe airspace opacities, slightly increased since prior study. No effusions or acute bony abnormality. IMPRESSION: Bilateral lower lobe atelectasis or infiltrates. Electronically Signed   By: Charlett Nose M.D.   On: 10/04/2023 18:27   IR Perc Cholecystostomy Result Date: 10/04/2023 INDICATION: Acute cholecystitis. Prior cholecystostomy drain placed 09/03/2023, dislodged 09/22/2023.  EXAM: ULTRASOUND AND FLUOROSCOPIC-GUIDED CHOLECYSTOSTOMY TUBE PLACEMENT COMPARISON:  US Abdomen, 10/03/2023.  NM HIDA, 10/04/2023. MEDICATIONS: The patient is currently admitted to the hospital and on intravenous antibiotics. Antibiotics were administered within an appropriate time frame prior to skin puncture. ANESTHESIA/SEDATION: Moderate (conscious) sedation was employed during this procedure. A total of Versed 2 mg and Fentanyl 100 mcg was administered intravenously. Moderate Sedation Time: 21 minutes. The patient's level of consciousness and vital signs were monitored continuously by radiology nursing throughout the procedure under my direct supervision. CONTRAST:  20mL OMNIPAQUE IOHEXOL 300 MG/ML SOLN - administered into the gallbladder fossa. FLUOROSCOPY TIME:  Fluoroscopic dose; 6 mGy COMPLICATIONS: None immediate. PROCEDURE: Informed written consent was obtained from the patient after a discussion of the risks, benefits and alternatives to treatment. Questions regarding the procedure were encouraged and answered. A timeout was performed prior to the initiation of the  procedure. The right upper abdominal quadrant was prepped and draped in the usual sterile fashion, and a sterile drape was applied covering the operative field. Maximum barrier sterile technique with sterile gowns and gloves were used for the procedure. A timeout was performed prior to the initiation of the procedure. Local anesthesia was provided with 1% lidocaine with epinephrine. Ultrasound scanning of the RIGHT upper quadrant demonstrates a distended gallbladder. Utilizing a transhepatic approach, a 22 gauge needle was advanced into the gallbladder under direct ultrasound guidance. An ultrasound image was saved for documentation purposes. Appropriate intraluminal puncture was confirmed with the efflux of bile and advancement of an 0.018 wire into the gallbladder lumen. The needle was exchanged for an Accustick set. A small amount of contrast was injected to confirm appropriate intraluminal positioning. Over a Benson wire, a 10 Fr cholecystomy tube was advanced into the gallbladder fossa, coiled and locked. Bile was aspirated and a small amount of contrast was injected as several post procedural spot radiographic images were obtained in various obliquities. The catheter was secured to the skin with suture, connected to a drainage bag and a dressing was placed. The patient tolerated the procedure well without immediate post procedural complication. IMPRESSION: Successful ultrasound and fluoroscopic guided placement of a 10 Fr cholecystostomy tube. RECOMMENDATIONS: The patient will return to Vascular Interventional Radiology (VIR) for routine drainage catheter evaluation and exchange in 6-8 weeks. Roanna Banning, MD [facility] Electronically Signed   By: Roanna Banning M.D.   On: 10/04/2023 16:19   NM Hepatobiliary Liver Func Result Date: 10/04/2023 CLINICAL DATA:  Cholelithiasis and cholecystitis. Recent percutaneous cholecystostomy, which no longer in  place. Internal biliary stent. Pancreatic carcinoma. EXAM: NUCLEAR MEDICINE HEPATOBILIARY IMAGING TECHNIQUE: Sequential images of the abdomen were obtained out to 60 minutes following intravenous administration of radiopharmaceutical. Because no gallbladder activity was visualized at this time, 3 mg morphine was administered intravenously, and imaging was continued for another 30 minutes RADIOPHARMACEUTICALS:  5.3 mCi Tc-1m  Choletec IV COMPARISON:  None Available. FINDINGS: Prompt uptake and biliary excretion of activity by the liver is seen. Biliary activity promptly passes into small bowel, consistent with patent common bile duct. No gallbladder activity is visualized, either before or following administration of intravenous morphine, consistent with cystic duct obstruction. Reflux of biliary activity into the stomach is also noted. IMPRESSION: Absence of gallbladder activity, consistent with cystic duct obstruction. No evidence of biliary obstruction. Bile reflux noted. Electronically Signed   By: Danae Orleans M.D.   On: 10/04/2023 10:33   US Abdomen Limited RUQ (LIVER/GB) Result Date: 10/03/2023 CLINICAL DATA:  [id number] Thickening of wall of gallbladder [id number] Cholelithiasis [id number] EXAM: ULTRASOUND ABDOMEN LIMITED RIGHT UPPER QUADRANT COMPARISON:  US Abdomen, 01/11/2023. CT of pelvis, 10/03/2023 and 09/01/2023. FINDINGS: Suboptimal evaluation, with poor acoustic penetration secondary to patient habitus. Gallbladder: Dependent echogenic gallstone, within a nondistended gallbladder, measuring up to 2.3 cm. Gallbladder wall thickening, measuring 0.6 cm with mild pericholecystic fluid. No sonographic Murphy sign noted by sonographer. Common bile duct: Diameter: Poorly visualized. Liver: Increased hepatic parenchymal echogenicity. Ovoid, hypoechoic RIGHT liver lesion measuring 2.2 x 1.4 x 1.5 cm without internal vascularity likely consistent with a cysts. Portal vein is patent on color Doppler imaging  with normal direction of blood flow towards the liver. Other: No perihepatic fluid. IMPRESSION: 1. Nondistended gallbladder with gallstone, relative wall thickening and pericholecystic fluid. Examination is equivocal for calculus cholecystitis, consider NM HIDA for further evaluation if continued clinical concern. 2. Echogenic liver. Findings most commonly seen in hepatic steatosis, though may also represent hepatitis and/or fibrosis. Electronically Signed   By: Roanna Banning M.D.   On: 10/03/2023 14:54   CT ABDOMEN PELVIS W CONTRAST Result Date: 10/03/2023 CLINICAL DATA:  Abdominal pain, acute, nonlocalized pancreatic cancer, hx of bilary stent, pain worse in ruq Right lower quadrant abdominal pain for 3 days with nausea. Recently completed radiation therapy. EXAM: CT ABDOMEN AND PELVIS WITH CONTRAST TECHNIQUE: Multidetector CT imaging of the abdomen and pelvis was performed using the standard protocol following bolus administration of intravenous contrast. RADIATION DOSE REDUCTION: This exam was performed according to the departmental dose-optimization program which includes automated exposure control, adjustment of the mA and/or kV according to patient size and/or use of iterative reconstruction technique. CONTRAST:  OMNIPAQUE IOHEXOL 300 MG/ML  SOLN COMPARISON:  Abdominopelvic CT 09/01/2023 and 08/14/2023. Abdominal radiographs 09/06/2023. FINDINGS: Lower chest: Chronic elevation of the left hemidiaphragm with associated chronic bibasilar atelectasis. The overall aeration of the left lung base has improved from the prior study. No significant pleural or pericardial effusion. Patient has a pacemaker. There is aortic and coronary artery atherosclerosis. Hepatobiliary: Grossly stable hepatic cysts. No suspicious liver lesions are identified. Metallic biliary stent is unchanged in position with persistent pneumobilia. Small gallstones, gallbladder wall thickening and pericholecystic inflammation, similar to  previous CT. Since the prior CT, the patient had a cholecystostomy tube placed and removed. Pancreas: Grossly stable ill-defined 2.2 cm mass involving the pancreatic head on image 29/4. Persistent moderate pancreatic ductal dilatation. No acute peripancreatic fluid collections are identified. Spleen: Normal in size without focal abnormality. Adrenals/Urinary Tract: Both adrenal glands appear normal. No evidence of urinary tract calculus, suspicious renal lesion or hydronephrosis. Unchanged renal cysts for which no specific follow-up imaging is recommended. The bladder appears unremarkable for its degree of distention. Stomach/Bowel: No enteric contrast was administered. A duodenal stent is in place (placed 09/05/2023) and extending from the distal stomach into the 2nd portion of the duodenum, grossly unchanged from reference prior radiographs. There is fluid density within the stent lumen without soft tissue components to suggest tumor ingrowth. The stomach remains decompressed with stable postsurgical changes proximally. No significant bowel distension, wall thickening or surrounding inflammation. The appendix appears normal. There is a patent distal colonic anastomosis. Vascular/Lymphatic: No enlarged abdominopelvic lymph nodes are identified. Diffuse aortic and branch vessel atherosclerosis without evidence of aneurysm or acute large vessel occlusion. There is chronic venous occlusion at the portal splenic confluence. Reproductive: Prostate brachytherapy seeds. Other: Postsurgical changes in the anterior abdominal wall. Generalized subcutaneous and mesenteric edema has mildly progressed compared with the  prior CT. No ascites, focal fluid collection or pneumoperitoneum demonstrated. Musculoskeletal: No acute or significant osseous findings. Multilevel spondylosis. IMPRESSION: 1. Cholelithiasis with gallbladder wall thickening and pericholecystic inflammation, similar to previous CT. Since the prior CT, the patient  had a cholecystostomy tube placed and removed. Findings could reflect ongoing cholecystitis. 2. Interval duodenal stent placement with decompression of the stomach. 3. Grossly stable ill-defined pancreatic head mass with associated pancreatic ductal dilatation and chronic venous occlusion. No definite evidence of metastatic disease. 4. Metallic biliary stent remains in place with persistent pneumobilia. 5. Generalized subcutaneous and mesenteric edema has mildly progressed compared with the prior CT. 6.  Aortic Atherosclerosis (ICD10-I70.0). Electronically Signed   By: Carey Bullocks M.D.   On: 10/03/2023 12:23   IR Sinus/Fist Tube Chk-Non GI Result Date: 09/22/2023 INDICATION: Retracted cholecystostomy, no output EXAM: Fluoroscopic evaluation and injection of the existing cholecystostomy. Cholecystostomy removal MEDICATIONS: None. ANESTHESIA/SEDATION: None. COMPLICATIONS: None immediate. PROCEDURE: Informed written consent was obtained from the patient after a thorough discussion of the procedural risks, benefits and alternatives. All questions were addressed. Maximal Sterile Barrier Technique was utilized including caps, mask, sterile gowns, sterile gloves, sterile drape, hand hygiene and skin antiseptic. A timeout was performed prior to the initiation of the procedure. Existing right upper quadrant cholecystostomy is clearly retracted. Injection confirms the catheter has retracted and is within the periphery of the right liver. Catheter was cut and removed. Kumpe catheter was advanced into the percutaneous tract. Contrast injection demonstrates no residual patent transhepatic tract to the gallbladder. Catheter and guidewire access removed. Access could not be re-established into the gallbladder. Following this, ultrasound of the right upper quadrant demonstrates no significant dilatation of the gallbladder. Cholelithiasis noted. Diffuse pneumobilia as before related to the known endoscopic biliary stent.  IMPRESSION: Retracted cholecystostomy within the periphery of the right liver. No residual patent percutaneous transhepatic tract to reestablish gallbladder access. Therefore the cholecystostomy was removed. Right upper quadrant limited ultrasound demonstrates no significant dilatation of the gallbladder. Gallstones are noted. No sonographic Murphy's sign. Findings discussed with the patient and his son. They understand if he has recurrent right upper quadrant pain, fevers, nausea and vomiting he may require cholecystostomy replacement in the future. They are in agreement with this plan. Electronically Signed   By: Judie Petit.  Shick M.D.   On: 09/22/2023 17:16    Labs: BNP (last 3 results) Recent Labs    01/09/23 1812 01/13/23 0345 02/28/23 0030  BNP 282.4* 114.0* 60.0   Basic Metabolic Panel: Recent Labs  Lab 10/06/23 0814 10/07/23 0828 10/08/23 0413 10/09/23 0356 10/10/23 0500  NA 131* 134* 135 137 138  K 3.3* 3.6 3.7 3.7 3.7  CL 103 104 104 110 108  CO2 22 23 23 23 23   GLUCOSE 147* 134* 110* 101* 99  BUN 34* 33* 28* 25* 21  CREATININE 1.95* 1.54* 1.32* 1.19 1.18  CALCIUM 7.9* 8.0* 8.2* 8.0* 8.2*  MG 2.0 2.2 2.2 2.3 2.2  PHOS  --   --   --  2.9  --    Liver Function Tests: Recent Labs  Lab 10/06/23 0814  AST 20  ALT 14  ALKPHOS 51  BILITOT 0.4  PROT 5.6*  ALBUMIN 2.4*   No results for input(s): "LIPASE", "AMYLASE" in the last 168 hours. No results for input(s): "AMMONIA" in the last 168 hours. CBC: Recent Labs  Lab 10/06/23 0814 10/07/23 0828 10/08/23 0413 10/09/23 0356 10/10/23 0500 10/11/23 0920  WBC 15.8* 4.9 3.0* 1.8* 2.3* 3.6*  NEUTROABS 13.4*  3.9 2.2 1.2* 1.3*  --   HGB 9.8* 8.7* 8.7* 8.3* 8.5* 9.1*  HCT 30.3* 28.1* 27.3* 26.3* 27.6* 30.1*  MCV 85.8 87.0 85.8 85.7 87.1 88.0  PLT 173 112* 114* 100* 113* 148*   Recent Labs  Lab 10/04/23 1755 10/05/23 1126  GLUCAP 88 225*  Anemia work up No results for input(s): "VITAMINB12", "FOLATE", "FERRITIN",  "TIBC", "IRON", "RETICCTPCT" in the last 72 hours. Urinalysis    Component Value Date/Time   COLORURINE YELLOW 10/03/2023 1125   APPEARANCEUR CLEAR 10/03/2023 1125   LABSPEC 1.017 10/03/2023 1125   PHURINE 5.0 10/03/2023 1125   GLUCOSEU NEGATIVE 10/03/2023 1125   HGBUR NEGATIVE 10/03/2023 1125   BILIRUBINUR NEGATIVE 10/03/2023 1125   BILIRUBINUR n 10/10/2015 0919   KETONESUR NEGATIVE 10/03/2023 1125   PROTEINUR NEGATIVE 10/03/2023 1125   UROBILINOGEN 0.2 10/10/2015 0919   UROBILINOGEN 0.2 05/13/2015 0922   NITRITE NEGATIVE 10/03/2023 1125   LEUKOCYTESUR NEGATIVE 10/03/2023 1125   Sepsis Labs Recent Labs  Lab 10/08/23 0413 10/09/23 0356 10/10/23 0500 10/11/23 0920  WBC 3.0* 1.8* 2.3* 3.6*   Microbiology Recent Results (from the past 240 hours)  Aerobic/Anaerobic Culture w Gram Stain (surgical/deep wound)     Status: None   Collection Time: 10/04/23  3:04 PM   Specimen: BILE  Result Value Ref Range Status   Specimen Description   Final    BILE Performed at [facility], [address], [place], [place] [address]    Special Requests   Final    NONE Performed at [facility], [address] [place], [place] [address]    Gram Stain   Final    FEW WBC PRESENT,BOTH PMN AND MONONUCLEAR ABUNDANT GRAM POSITIVE COCCI MODERATE GRAM NEGATIVE RODS RARE GRAM POSITIVE RODS    Culture   Final    Multiple bacterial morphotypes present, none predominant. Suggest appropriate recollection if clinically indicated. MIXED ANAEROBIC FLORA PRESENT.  CALL LAB IF FURTHER IID REQUIRED. NO STAPHYLOCOCCUS AUREUS ISOLATED NO GROUP A STREP (S.PYOGENES) ISOLATED Performed at [facility], [address], [place], [place] [address]    Report Status 10/07/2023 FINAL  Final  MRSA Next Gen by PCR, Nasal     Status: None   Collection Time: 10/04/23  6:31 PM   Specimen: Nasal Mucosa; Nasal Swab  Result Value Ref Range Status   MRSA by PCR Next Gen NOT  DETECTED NOT DETECTED Final    Comment: (NOTE) The GeneXpert MRSA Assay (FDA approved for NASAL specimens only), is one component of a comprehensive MRSA colonization surveillance program. It is not intended to diagnose MRSA infection nor to guide or monitor treatment for MRSA infections. Test performance is not FDA approved in patients less than 43 years old. Performed at [facility], [address], [place], [place] [address]   Culture, blood (Routine X 2) w Reflex to ID Panel     Status: None   Collection Time: 10/05/23  4:17 PM   Specimen: BLOOD LEFT ARM  Result Value Ref Range Status   Specimen Description   Final    BLOOD LEFT ARM Performed at [facility], [address] [place], [place] [address]    Special Requests   Final    BOTTLES DRAWN AEROBIC AND ANAEROBIC Blood Culture adequate volume Performed at [facility], [address], [place], [place] [address]    Culture   Final    NO GROWTH 5 DAYS Performed at [facility], [address], [place], [place] [address]    Report Status 10/10/2023 FINAL  Final  Culture, blood (Routine X 2) w Reflex to ID Panel     Status: None   Collection Time: 10/05/23  4:22 PM   Specimen: BLOOD LEFT ARM  Result Value Ref Range Status   Specimen Description   Final    BLOOD LEFT ARM Performed at [facility], [address], [place], [place] [address]    Special Requests   Final    BOTTLES DRAWN AEROBIC AND ANAEROBIC Blood Culture results may not be optimal due to an inadequate volume of blood received in culture bottles Performed at [facility], [address] [place], [place] [address]    Culture   Final    NO GROWTH 5 DAYS Performed at [facility], [address] [place], [place] [address]    Report Status 10/10/2023 FINAL  Final   Time coordinating discharge: 35 minutes SIGNED: Lanae Boast, MD  [facility] 10/11/2023, 1:43 PM  If  7PM-7AM, please contact night-coverage [email or web address]

## 2023-10-11 NOTE — Progress Notes (Signed)
 Call to pt regarding discharge meds from [facility] pharmacy to see if pt had picked u[p meds on the way home, pt had not picked up meds - pt will have someone return today to pick up medications - both are antibiotics which pt needs to continue today with doses due tonight. Pt verbalized an understanding.

## 2023-10-12 ENCOUNTER — Ambulatory Visit

## 2023-10-12 ENCOUNTER — Telehealth: Payer: Self-pay

## 2023-10-12 ENCOUNTER — Ambulatory Visit: Payer: Non-veteran care

## 2023-10-12 ENCOUNTER — Telehealth: Payer: Self-pay | Admitting: *Deleted

## 2023-10-12 NOTE — Telephone Encounter (Signed)
 Spoke with patient regarding message from scheduling. Explained reason for appointment was to follow up on antibiotics recommended by Dr. Elinor Parkinson during admission; and to ensure that he is cleared from ID standpoint.  Pt states that he just has too many appointment's since d/c yesterday, and would like to call back next week to setup appt.  Will route message to Dr. Elinor Parkinson so she is aware. Juanita Laster, RMA

## 2023-10-12 NOTE — Telephone Encounter (Signed)
-----   Message from Lucrezia Europe sent at 10/12/2023  8:34 AM EDT ----- Regarding: RE: HFU Patient declined scheduling at this time and asked if we could reach out at another time maybe another week or so. He did originally ask if we could coordinate this with his oncology provider, but I did let him know that I can see his other appt's to ensure no overbooking.   He still proceeded that he was in the hospital, unsure of all the appts that he has going on at the moment, and just simply cannot get around to get his computer to do so.   I wanted to double check, was he aware of needing to see [place]? ----- Message ----- From: Juanita Laster, RMA Sent: 10/12/2023   8:21 AM EDT To: Young Berry Burroughs; Sheran Spine; # Subject: FW: HFU                                        Pt needs to schedule HSFU appt. ----- Message ----- From: Odette Fraction, MD Sent: 10/12/2023   8:17 AM EDT To: Sheran Spine; Lucrezia Europe; # Subject: HFU                                            Team  This patient needs HFU in next 1-2 weeks with one of the providers as new patient, Notes are in the chart.

## 2023-10-12 NOTE — Telephone Encounter (Signed)
 [name] called confused with this medications since hospital d/c. Wants to confirm he no longer needs to take Xeloda since he is not having anymore radiation. Confirmed this with him and suggested he bring all old meds to next visit and we can dispose of properly. He agrees.

## 2023-10-12 NOTE — Transitions of Care (Post Inpatient/ED Visit) (Signed)
   10/12/2023  Name: Marcus [name] MRN: [id number] DOB: 11/11/1940  Today's TOC FU Call Status: Today's TOC FU Call Status:: Unsuccessful Call (1st Attempt) Unsuccessful Call (1st Attempt) Date: 10/12/23  Spoke with patient for a brief moment and he states that he is in a meeting and unable to talk.  Attempted to reach the patient regarding the most recent Inpatient/ED visit.  Follow Up Plan: Additional outreach attempts will be made to reach the patient to complete the Transitions of Care (Post Inpatient/ED visit) call.   Lonia Chimera, RN, BSN, CEN [employer]- Transition of Care Team.  [facility] [phone]

## 2023-10-13 ENCOUNTER — Telehealth: Payer: Self-pay

## 2023-10-13 ENCOUNTER — Ambulatory Visit

## 2023-10-13 ENCOUNTER — Ambulatory Visit: Payer: Non-veteran care

## 2023-10-13 NOTE — Radiation Completion Notes (Signed)
 Patient Name: Marcus [name], Marcus [name] MRN: [id number] Date of Birth: [birth year] Referring Physician: Thornton Papas, M.D. Date of Service: 2023-10-13 Radiation Oncologist: Margaretmary Bayley, M.D. [facility]                             RADIATION ONCOLOGY END OF TREATMENT NOTE     Diagnosis: C25.0 Malignant neoplasm of head of pancreas Staging on 2022-12-29: Pancreatic adenocarcinoma (HCC) T=cT2, N=cN0, M=cM0 Intent: Curative     ==========DELIVERED PLANS==========  First Treatment Date: 2023-09-14 Last Treatment Date: 2023-09-27   Plan Name: Pancreas:1 Site: Pancreas Technique: IMRT Mode: Photon Dose Per Fraction: 1.8 Gy Prescribed Dose (Delivered / Prescribed): 16.2 Gy / 45 Gy Prescribed Fxs (Delivered / Prescribed): 9 / 25     ==========ON TREATMENT VISIT DATES========== 2023-09-16, 2023-09-23     ==========UPCOMING VISITS========== 11/17/2023 [facility] CARE [facility] OFFICE VISIT - Erline Hau Oretha Milch, MD  11/16/2023 [facility] RAD IR [facility]IR1/WLIR1 [facility]-IR 1  11/14/2023 [facility] ST OFFICE HOME REMOTE PACER CK [facility] DEVICE REMOTES  10/28/2023 [facility] EST PT 20 [name], MD  10/28/2023 [facility] LAB [facility] PHLEBOTOMIST  10/28/2023 [facility] PORT FLUSH W/LAB [facility] INFUSION  10/17/2023 [facility] ONCOL[facility] NUT 45 Zenovia Jarred L, RD        ==========APPENDIX - ON TREATMENT VISIT NOTES==========   See weekly On Treatment Notes in Epic for details in the Media tab (listed as Progress notes on the On Treatment Visit Dates listed above).

## 2023-10-13 NOTE — Transitions of Care (Post Inpatient/ED Visit) (Signed)
   10/13/2023  Name: Marcus [name] MRN: [id number] DOB: [birth year]  Today's TOC FU Call Status: Today's TOC FU Call Status:: Successful TOC FU Call Completed TOC FU Call Complete Date: 10/13/23 Patient's Name and Date of Birth confirmed.  Transition Care Management Follow-up Telephone Call How have you been since you were released from the hospital?: Better (reports feeling better. no abd pain.) Any questions or concerns?: No  Items Reviewed: Did you receive and understand the discharge instructions provided?: Yes Medications obtained,verified, and reconciled?: Partial Review Completed Reason for Partial Mediation Review: Patient reports that he is weak and states that he has all his medications and knows how to take them . Reports Dr. Danielle Dess nurse helped him yesterday. Any new allergies since your discharge?: No Dietary orders reviewed?: Yes Type of Diet Ordered:: 6 small meals per day. Do you have support at home?: Yes People in Home [RPT]: child(ren), adult Name of Support/Comfort Primary Source: Marcus [name]  Medications Reviewed Today: Medications Reviewed Today   Medications were not reviewed in this encounter     Home Care and Equipment/Supplies: Were Home Health Services Ordered?: Yes Name of Home Health Agency:: [facility] Has Agency set up a time to come to your home?: No (Spoke with Marcus [name] and they state patient is active with [facility].  Spoke with Marcus [name] at [facility] who states that [name] stated that patient is active with [facility]) Any new equipment or medical supplies ordered?: No  Functional Questionnaire: Do you need assistance with bathing/showering or dressing?: No Do you need assistance with meal preparation?: Yes (daughters) Do you need assistance with eating?: No Do you have difficulty maintaining continence: No Do you need assistance with getting out of bed/getting out of a chair/moving?: Yes (family) Do you have difficulty managing or taking your  medications?: No  Follow up appointments reviewed: PCP Follow-up appointment confirmed?: No MD Provider Line [phone] Given: No Specialist Hospital Follow-up appointment confirmed?: Yes Date of Specialist follow-up appointment?: 10/28/23 Follow-Up Specialty Provider:: Dr. Truett Perna Do you need transportation to your follow-up appointment?: No Do you understand care options if your condition(s) worsen?: Yes-patient verbalized understanding  SDOH Interventions Today    Flowsheet Row Most Recent Value  SDOH Interventions   Food Insecurity Interventions Intervention Not Indicated  Housing Interventions Intervention Not Indicated  Transportation Interventions Intervention Not Indicated  Utilities Interventions Intervention Not Indicated      Patient reports that he is weak and denies wanting to finish call. Patient declined to review medications or systems assessment.  Reviewed with patient that [facility] health was ordered and patient was unaware. Placed call to [facility] and spoke with manager [name].  Placed call to [facility] and confirmed patient was seen yesterday for resumption of care for PT. Patient informed.  Reviewed 30 day TOC program and patient states " I will know what I need in a few weeks"  "I do not need you to call me back"  Partial assessment and partial completion of TOC. Lonia Chimera, RN, BSN, CEN [employer]- Transition of Care Team.  [facility] [phone]

## 2023-10-14 ENCOUNTER — Ambulatory Visit

## 2023-10-14 ENCOUNTER — Ambulatory Visit: Payer: Non-veteran care

## 2023-10-17 ENCOUNTER — Ambulatory Visit

## 2023-10-17 ENCOUNTER — Inpatient Hospital Stay: Attending: Oncology | Admitting: Nutrition

## 2023-10-17 ENCOUNTER — Encounter: Payer: Self-pay | Admitting: Nutrition

## 2023-10-17 DIAGNOSIS — K819 Cholecystitis, unspecified: Secondary | ICD-10-CM | POA: Insufficient documentation

## 2023-10-17 DIAGNOSIS — K831 Obstruction of bile duct: Secondary | ICD-10-CM | POA: Insufficient documentation

## 2023-10-17 DIAGNOSIS — Z85038 Personal history of other malignant neoplasm of large intestine: Secondary | ICD-10-CM | POA: Insufficient documentation

## 2023-10-17 DIAGNOSIS — C787 Secondary malignant neoplasm of liver and intrahepatic bile duct: Secondary | ICD-10-CM | POA: Insufficient documentation

## 2023-10-17 DIAGNOSIS — C250 Malignant neoplasm of head of pancreas: Secondary | ICD-10-CM | POA: Insufficient documentation

## 2023-10-17 NOTE — Progress Notes (Signed)
 Patient did not show up for nutrition appointment.

## 2023-10-18 ENCOUNTER — Ambulatory Visit

## 2023-10-19 ENCOUNTER — Ambulatory Visit

## 2023-10-20 ENCOUNTER — Ambulatory Visit

## 2023-10-21 ENCOUNTER — Ambulatory Visit

## 2023-10-22 ENCOUNTER — Other Ambulatory Visit (HOSPITAL_BASED_OUTPATIENT_CLINIC_OR_DEPARTMENT_OTHER): Payer: Self-pay

## 2023-10-24 ENCOUNTER — Other Ambulatory Visit (HOSPITAL_COMMUNITY)

## 2023-10-24 ENCOUNTER — Ambulatory Visit

## 2023-10-25 ENCOUNTER — Ambulatory Visit

## 2023-10-25 ENCOUNTER — Other Ambulatory Visit: Payer: Self-pay

## 2023-10-26 ENCOUNTER — Other Ambulatory Visit: Payer: Self-pay | Admitting: Adult Health

## 2023-10-26 ENCOUNTER — Ambulatory Visit

## 2023-10-26 ENCOUNTER — Other Ambulatory Visit (HOSPITAL_BASED_OUTPATIENT_CLINIC_OR_DEPARTMENT_OTHER): Payer: Self-pay

## 2023-10-26 MED ORDER — OXYCODONE HCL 5 MG PO TABS
5.0000 mg | ORAL_TABLET | Freq: Four times a day (QID) | ORAL | 0 refills | Status: DC | PRN
  Filled 2023-10-26: qty 30, 8d supply, fill #0

## 2023-10-26 NOTE — Telephone Encounter (Signed)
 Okay for refill?

## 2023-10-27 ENCOUNTER — Ambulatory Visit: Payer: Self-pay | Admitting: *Deleted

## 2023-10-27 ENCOUNTER — Ambulatory Visit

## 2023-10-27 ENCOUNTER — Other Ambulatory Visit: Payer: Self-pay | Admitting: *Deleted

## 2023-10-27 ENCOUNTER — Telehealth: Payer: Self-pay | Admitting: *Deleted

## 2023-10-27 ENCOUNTER — Inpatient Hospital Stay: Admitting: Family Medicine

## 2023-10-27 DIAGNOSIS — C259 Malignant neoplasm of pancreas, unspecified: Secondary | ICD-10-CM

## 2023-10-27 NOTE — Telephone Encounter (Signed)
 Received voicemail from patient that he is having increased pain with biliary drain, "the pain medicine does not given relief very long"  He states no fever, he states that the drain although it flushes it not producing much drainage over last couple of days.  He states that the site is red and warm to touch.  Message sent to [facility] IR drain clinic and [facility] provider team to make plan

## 2023-10-27 NOTE — Telephone Encounter (Signed)
**Note De-identified  [place] Obfuscation** Please advise 

## 2023-10-27 NOTE — Telephone Encounter (Signed)
 Copied from CRM [id number]. Topic: Clinical - Red Word Triage >> Oct 27, 2023  8:00 AM [place] A wrote: [facility] that prompted transfer to Nurse Triage: Patient has drain in gallbladder and it is causing constant pain Reason for Disposition  [1] MILD-MODERATE pain AND [2] constant AND [3] present > 2 hours  Answer Assessment - Initial Assessment Questions 1. LOCATION: "Where does it hurt?"      I have a drain in my gallbladder.   I don't know who the surgeon is or any information about it.   I've had it in for at least 3 weeks for an infection.   They could not remove it.    The pain is around the site where the drain comes out.   It's red at the insertion site.    There is a thick fluid.   When I wash the bag out it drains quicker.   It's thicker than water  when I clean the bag out.    I get 200-300 cc out daily except for the last 2 days I have not been eating at all due to the pain.     The pain medicine they gave me is not working.   It needs to be stronger. 2. RADIATION: "Does the pain shoot anywhere else?" (e.g., chest, back)     The pain does go to different areas. 3. ONSET: "When did the pain begin?" (Minutes, hours or days ago)      It's been bad for a while.  It was inserted 3 weeks ago.  It was done at [facility]. 4. SUDDEN: "Gradual or sudden onset?"     Gradually getitng 5. PATTERN "Does the pain come and go, or is it constant?"    - If it comes and goes: "How long does it last?" "Do you have pain now?"     (Note: Comes and goes means the pain is intermittent. It goes away completely between bouts.)    - If constant: "Is it getting better, staying the same, or getting worse?"      (Note: Constant means the pain never goes away completely; most serious pain is constant and gets worse.)      Getting worse 6. SEVERITY: "How bad is the pain?"  (e.g., Scale 1-10; mild, moderate, or severe)    - MILD (1-3): Doesn't interfere with normal activities, abdomen soft and not tender  to touch.     - MODERATE (4-7): Interferes with normal activities or awakens from sleep, abdomen tender to touch.     - SEVERE (8-10): Excruciating pain, doubled over, unable to do any normal activities.       Moderate getting worse 7. RECURRENT SYMPTOM: "Have you ever had this type of stomach pain before?" If Yes, ask: "When was the last time?" and "What happened that time?"      Not asked 8. CAUSE: "What do you think is causing the stomach pain?"     No.    I don't have the paperwork any longer from the hospital. 9. RELIEVING/AGGRAVATING FACTORS: "What makes it better or worse?" (e.g., antacids, bending or twisting motion, bowel movement)     Nothing 10. OTHER SYMPTOMS: "Do you have any other symptoms?" (e.g., back pain, diarrhea, fever, urination pain, vomiting)       No diarrhea nausea or vomiting.      No fever.  Protocols used: Abdominal Pain - Male-A-AH  Chief Complaint: Pt. Called in c/o pain around the site where he has  a drain into his gallbladder.  He could not tell me any details of who ordered it, who his doctor was, etc.   I had to ask a lot of questions to get the information needed for this call.   It was inserted 3 weeks ago at [facility] per his chart.  He has pancreatic cancer and is being seen at the [facility].   He doesn't know who ordered the drain.  He didn't have his discharge papers anymore and has not been seen since this was inserted 3 weeks ago.   I pulled up his AVS.   He is supposed to be getting home health care but he never got it set up.   He didn't know anything about it when I asked him about it.   He is a pt of [place] NP with [employer].   He didn't have any appts available so I was able to get him scheduled with Dr. Cinda Craze for today at 3:00.    Symptoms: Pain and redness around the insertion site of the drain going into his gallbladder.  The drainage is thicker than normal that is going into the bag.   It drains  better after he washes the bag out.    Frequency: "Quit a while" when asked when the pain and thicker drainage started.   He is vague with his answers. Pertinent Negatives: Patient denies fever, diarrhea, vomiting or nausea.   Just the pain around the insertion site. Disposition: [] ED /[] Urgent Care (no appt availability in office) / [x] Appointment(In office/virtual)/ []  [facility]/ [] Home Care/ [] Refused Recommended Disposition /[] [place]/ []  Follow-up with PCP Additional Notes: Appt made for today with Dr. Cinda Craze for 3:00.  I'm sending a message.   This pt probably needs to be seen by his PCP however due to his symptoms the protocol was to be seen today.  Perhaps Randel Buss can work him in today.

## 2023-10-28 ENCOUNTER — Inpatient Hospital Stay

## 2023-10-28 ENCOUNTER — Inpatient Hospital Stay (HOSPITAL_BASED_OUTPATIENT_CLINIC_OR_DEPARTMENT_OTHER): Admitting: Oncology

## 2023-10-28 ENCOUNTER — Encounter: Payer: Self-pay | Admitting: Radiology

## 2023-10-28 ENCOUNTER — Ambulatory Visit (HOSPITAL_COMMUNITY)
Admission: RE | Admit: 2023-10-28 | Discharge: 2023-10-28 | Disposition: A | Discharge status: Home / Self Care | Source: Ambulatory Visit | Attending: Radiology | Admitting: Radiology

## 2023-10-28 ENCOUNTER — Other Ambulatory Visit (HOSPITAL_BASED_OUTPATIENT_CLINIC_OR_DEPARTMENT_OTHER): Payer: Self-pay

## 2023-10-28 ENCOUNTER — Ambulatory Visit

## 2023-10-28 ENCOUNTER — Other Ambulatory Visit (HOSPITAL_COMMUNITY): Payer: Self-pay | Admitting: Radiology

## 2023-10-28 VITALS — BP 126/69 | HR 76 | Temp 97.9°F | Resp 18 | Ht 68.0 in | Wt 198.0 lb

## 2023-10-28 DIAGNOSIS — K819 Cholecystitis, unspecified: Secondary | ICD-10-CM | POA: Diagnosis not present

## 2023-10-28 DIAGNOSIS — Z85038 Personal history of other malignant neoplasm of large intestine: Secondary | ICD-10-CM

## 2023-10-28 DIAGNOSIS — C259 Malignant neoplasm of pancreas, unspecified: Secondary | ICD-10-CM

## 2023-10-28 DIAGNOSIS — C250 Malignant neoplasm of head of pancreas: Secondary | ICD-10-CM

## 2023-10-28 DIAGNOSIS — K831 Obstruction of bile duct: Secondary | ICD-10-CM | POA: Diagnosis not present

## 2023-10-28 DIAGNOSIS — R109 Unspecified abdominal pain: Secondary | ICD-10-CM | POA: Diagnosis not present

## 2023-10-28 DIAGNOSIS — Z95828 Presence of other vascular implants and grafts: Secondary | ICD-10-CM

## 2023-10-28 DIAGNOSIS — C787 Secondary malignant neoplasm of liver and intrahepatic bile duct: Secondary | ICD-10-CM | POA: Diagnosis not present

## 2023-10-28 DIAGNOSIS — Z7189 Other specified counseling: Secondary | ICD-10-CM | POA: Insufficient documentation

## 2023-10-28 HISTORY — PX: IR PATIENT EVAL TECH 0-60 MINS: IMG5564

## 2023-10-28 LAB — CBC WITH DIFFERENTIAL (CANCER CENTER ONLY)
Abs Immature Granulocytes: 0.02 10*3/uL (ref 0.00–0.07)
Basophils Absolute: 0 10*3/uL (ref 0.0–0.1)
Basophils Relative: 0 %
Eosinophils Absolute: 0.1 10*3/uL (ref 0.0–0.5)
Eosinophils Relative: 3 %
HCT: 27.1 % — ABNORMAL LOW (ref 39.0–52.0)
Hemoglobin: 8.7 g/dL — ABNORMAL LOW (ref 13.0–17.0)
Immature Granulocytes: 1 %
Lymphocytes Relative: 11 %
Lymphs Abs: 0.4 10*3/uL — ABNORMAL LOW (ref 0.7–4.0)
MCH: 27.1 pg (ref 26.0–34.0)
MCHC: 32.1 g/dL (ref 30.0–36.0)
MCV: 84.4 fL (ref 80.0–100.0)
Monocytes Absolute: 0.4 10*3/uL (ref 0.1–1.0)
Monocytes Relative: 12 %
Neutro Abs: 2.5 10*3/uL (ref 1.7–7.7)
Neutrophils Relative %: 73 %
Platelet Count: 151 10*3/uL (ref 150–400)
RBC: 3.21 MIL/uL — ABNORMAL LOW (ref 4.22–5.81)
RDW: 19.7 % — ABNORMAL HIGH (ref 11.5–15.5)
WBC Count: 3.4 10*3/uL — ABNORMAL LOW (ref 4.0–10.5)
nRBC: 0 % (ref 0.0–0.2)

## 2023-10-28 LAB — CMP (CANCER CENTER ONLY)
ALT: 8 U/L (ref 0–44)
AST: 19 U/L (ref 15–41)
Albumin: 2.8 g/dL — ABNORMAL LOW (ref 3.5–5.0)
Alkaline Phosphatase: 62 U/L (ref 38–126)
Anion gap: 9 (ref 5–15)
BUN: 22 mg/dL (ref 8–23)
CO2: 24 mmol/L (ref 22–32)
Calcium: 8.8 mg/dL — ABNORMAL LOW (ref 8.9–10.3)
Chloride: 103 mmol/L (ref 98–111)
Creatinine: 1.28 mg/dL — ABNORMAL HIGH (ref 0.61–1.24)
GFR, Estimated: 56 mL/min — ABNORMAL LOW (ref >60)
Glucose, Bld: 137 mg/dL — ABNORMAL HIGH (ref 70–99)
Potassium: 3.9 mmol/L (ref 3.5–5.1)
Sodium: 136 mmol/L (ref 135–145)
Total Bilirubin: 0.6 mg/dL (ref 0.0–1.2)
Total Protein: 5.6 g/dL — ABNORMAL LOW (ref 6.5–8.1)

## 2023-10-28 MED ORDER — OXYCODONE HCL 5 MG PO TABS
5.0000 mg | ORAL_TABLET | Freq: Four times a day (QID) | ORAL | 0 refills | Status: DC | PRN
  Filled 2023-10-28: qty 60, 8d supply, fill #0

## 2023-10-28 MED ORDER — SODIUM CHLORIDE 0.9% FLUSH
10.0000 mL | INTRAVENOUS | Status: DC | PRN
  Administered 2023-10-28: 10 mL via INTRAVENOUS

## 2023-10-28 MED ORDER — HEPARIN SOD (PORK) LOCK FLUSH 100 UNIT/ML IV SOLN
500.0000 [IU] | Freq: Once | INTRAVENOUS | Status: AC
Start: 2023-10-28 — End: 2023-10-28
  Administered 2023-10-28: 500 [IU] via INTRAVENOUS

## 2023-10-28 NOTE — Assessment & Plan Note (Signed)
 Counseled and coordinated advanced care planning today.  Patient is aware he has stage IV disease and disease is incurable. Treatment offered is palliative in nature. We discussed a realistic and effective plan that will be reviewed and updated on an ongoing basis as indicated. We discussed the following: Risks, benefits and alternatives to the various treatment options Discussed patient's personal belief/values/goals Discussed palliative care options, ways to avoid hospitalization and patient's desire for care if decision making capacity is affected Discussed Code Status , patient agrees to DNR

## 2023-10-28 NOTE — Procedures (Signed)
 Mr Weitzel came in today complaining of pain with his GB drain. The dressing was removed and the Catheter was flushed with 5ml of NS without resistance.  Dr Marne Sings examined the catheter and skin site. He removed the suture which seemed to relieve his discomfort. A new stat lock was placed to secure the catheter as well as a dry gauze and tape dressing was applied.  The family left with his family. He was instructed to return in May for his routine catheter change.

## 2023-10-28 NOTE — Progress Notes (Signed)
 [facility] OFFICE PROGRESS NOTE   Diagnosis: Pancreas cancer  INTERVAL HISTORY:   Marcus [name] is as scheduled.  He was discharged from the hospital 10/11/2023 after admission with nausea and abdominal pain.  Imaging revealed evidence of cholecystitis/cystic duct obstruction.  A cholecystostomy tube was again placed.  He developed sepsis syndrome following the procedure and a drain culture grew mixed flora.  He was treated with antibiotics.  He was discharged to home on 10/11/2023.  He is here today with his son and daughter.  They report he is weak.  He has anorexia.  He continues to have right abdominal pain including pain at the biliary drain site.  The pain is not relieved with 5 mg of oxycodone .  His ability to ambulate and perform self-care has declined.  Objective:  Vital signs in last 24 hours:  Blood pressure 126/69, pulse 76, temperature 97.9 F (36.6 C), temperature source Temporal, resp. rate 18, height 5\' 8"  (1.727 m), weight 198 lb (89.8 kg), SpO2 98%.    Resp: Lungs clear bilaterally Cardio: Regular rhythm with premature beats GI: Tender in the right upper abdomen, no mass, biliary drain site without evidence of infection, no hepatosplenomegaly, no apparent ascites Vascular: Trace lower leg edema bilaterally  Portacath/PICC-without erythema  Lab Results:  Lab Results  Component Value Date   WBC 3.4 (L) 10/28/2023   HGB 8.7 (L) 10/28/2023   HCT 27.1 (L) 10/28/2023   MCV 84.4 10/28/2023   PLT 151 10/28/2023   NEUTROABS 2.5 10/28/2023    CMP  Lab Results  Component Value Date   NA 138 10/10/2023   K 3.7 10/10/2023   CL 108 10/10/2023   CO2 23 10/10/2023   GLUCOSE 99 10/10/2023   BUN 21 10/10/2023   CREATININE 1.18 10/10/2023   CALCIUM  8.2 (L) 10/10/2023   PROT 5.6 (L) 10/06/2023   ALBUMIN 2.4 (L) 10/06/2023   AST 20 10/06/2023   ALT 14 10/06/2023   ALKPHOS 51 10/06/2023   BILITOT 0.4 10/06/2023   GFRNONAA >60 10/10/2023   GFRAA >60  02/07/2019    Lab Results  Component Value Date   CEA1 6.4 (H) 12/11/2022   CEA 1.1 03/03/2010   AVW098 1,251 (H) 08/25/2023    Lab Results  Component Value Date   INR 1.4 (H) 10/04/2023   LABPROT 17.0 (H) 10/04/2023    Imaging:  No results found.  Medications: I have reviewed the patient's current medications.   Assessment/Plan:  Pancreas cancer CT abdomen/pelvis 12/03/2022-suspected pancreas mass in the proximal body measuring 2 cm with probable mass effect on the superior aspect of the SMV, multiple hypodense liver lesions, many are new from a remote CT-indeterminate, 2 cystic lesions in the pancreas tail-nonspecific prior bariatric surgery ERCP 12/13/2022-malignant appearing common bile duct stricture, plastic stent placed, brushing cytology-no malignant cells EUS 12/23/2022-30 x 33 mm pancreas head mass with invasion of the SMV, no malignant appearing lymph nodes, 4 cysts in the visualized liver, FNA-adenocarcinoma Cycle 1 gemcitabine  and abraxane  01/07/2023 CTs 01/09/2023-multiple low-attenuation liver lesions unchanged, largest of which are clearly benign fluid attenuation cyst, others more ill-defined and incompletely characterized suspicious for small metastases.  Similar appearance of hypodense mass in the pancreatic head.  Distended gallbladder with mild gallbladder wall thickening and adjacent pericholecystic fat stranding.  Sludge in the gallbladder fundus.  Common bile duct stent in place with diminished, persistent intrahepatic biliary ductal dilatation.  Negative for pulmonary embolism.  Acute appearing heterogeneous airspace opacity of the superior segment left lower  lobe consistent with infection or aspiration. Cycle 2 gemcitabine  and abraxane  01/21/2023, gemcitabine  dose reduced due to neutropenia following cycle 1 Cycle 3 gemcitabine /Abraxane  02/04/2023 Cycle 4 gemcitabine /Abraxane  02/17/2023 Cycle 5 gemcitabine /Abraxane  03/04/2023 Cycle 6 gemcitabine /Abraxane  03/17/2023 CT  abdomen/pelvis 03/28/2023-stable pancreas mass, multiple small hypoattenuating liver lesions (review of CTs at the GI tumor conference is most consistent with liver metastases-some smaller compared to the baseline CT) Cycle 7 gemcitabine /Abraxane  04/01/2023 Cycle 8 gemcitabine /Abraxane  04/15/2023 Cycle 9 gemcitabine /Abraxane  04/29/2023 Treatment held per patient request 05/12/2023 Cycle 10 gemcitabine /Abraxane  05/27/2023 Cycle 11 gemcitabine /Abraxane  06/10/2023 Cycle 12 gemcitabine /Abraxane  06/24/2023 CTs 07/01/2023-no significant difference in size of the mass in the pancreatic head.  Redemonstration of multiple hypoattenuating liver lesions essentially unchanged and favored to represent simple cysts. 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, cholelithiasis and mild pericholecystic inflammatory stranding Plan for 5-1/2-week course of radiation plus concurrent Xeloda , start date 09/15/2023-09/27/2023 (discontinued per patient) Biliary obstruction secondary to 1-status post ERCP/stent placement 12/13/2022 Coronary artery disease Atrial fibrillation Gout Sleep apnea Status post bariatric surgery Hypertension Bronchiectasis Gastroesophageal reflux disease History of colon cancer in his 40s Complete heart block-pacemaker placed Admission 01/09/2023 with a fever and failure to thrive, CT chest with evidence of left lung pneumonia, CT abdomen/pelvis with a distended  gallbladder and adjacent fat stranding Anemia 08/07/2023: Acute onset nausea/vomiting and abdominal pain-resolved after Zofran /morphine  in the emergency room, CT without acute findings Depression - he has high score PHQ-9 today, no SI, plan, or access today. He declined referral to mental health provider. Has a trusting relationship with PCP and agrees to call him soon to discuss his mental health  Admission 09/01/2023 with intractable nausea/vomiting and abdominal distention CT abdomen/pelvis-dilated stomach, wall thickening of the duodenum, cholelithiasis with inflammatory stranding surrounding gallbladder, common bile duct stent unchanged 09/03/2023-percutaneous cholecystostomy tube 09/05/2023-upper endoscopy: Congestion, friability in the duodenal bulb and duodenal sweep, severe stenosis within the pyloric channel/duodenal bulb/duodenal sweep, wall flex stent placed, duodenal biopsy-nonspecific duodenitis, negative for dysplasia or malignancy 09/22/2023-retracted cholecystostomy tube in the periphery of the right liver 10/04/2023-cholecystostomy tube    Disposition: Marcus [name] has a history of advanced pancreas cancer.  He has a history of biliary obstruction and a recent diagnosis of cholecystitis.  He underwent placement of a new cholecystostomy tube 10/04/2023.  Marcus [name] has pain in the right abdomen.  The pain is likely related to the cystostomy tube and the underlying pancreas cancer.  He will continue oxycodone  as needed for pain.  I discussed the prognosis with Marcus [name] and his family.  His performance status is declining.  He is no longer capable of self-care in the home.  He agrees to hospice care.  We will make a referral for home hospice care.  We discussed CPR and ACLS.  He will be placed on a no CODE BLUE status.  He is scheduled for follow-up in interventional radiology later today for evaluation of the cholecystostomy drain.  He continues apixaban  anticoagulation for history of atrial  fibrillation.  We we will assess the indication for continuing anticoagulation therapy if his clinical status continues  to decline.  Marcus [name] will return for an office visit in 3 weeks.    Coni Deep, MD  10/28/2023  8:58 AM

## 2023-10-28 NOTE — Progress Notes (Signed)
 Patients port flushed without difficulty.  Good blood return noted with no bruising or swelling noted at site.  Band aid applied.  Patient to see Dr Truett Perna today.

## 2023-10-29 LAB — CANCER ANTIGEN 19-9: CA 19-9: 1930 U/mL — ABNORMAL HIGH (ref 0–35)

## 2023-10-31 ENCOUNTER — Ambulatory Visit

## 2023-10-31 ENCOUNTER — Telehealth: Payer: Self-pay | Admitting: *Deleted

## 2023-10-31 ENCOUNTER — Ambulatory Visit (HOSPITAL_COMMUNITY)
Admission: RE | Admit: 2023-10-31 | Discharge: 2023-10-31 | Disposition: A | Discharge status: Home / Self Care | Source: Ambulatory Visit | Attending: Radiology | Admitting: Radiology

## 2023-10-31 ENCOUNTER — Other Ambulatory Visit (HOSPITAL_COMMUNITY): Payer: Self-pay | Admitting: Radiology

## 2023-10-31 DIAGNOSIS — K819 Cholecystitis, unspecified: Secondary | ICD-10-CM

## 2023-10-31 DIAGNOSIS — Z9181 History of falling: Secondary | ICD-10-CM | POA: Diagnosis not present

## 2023-10-31 DIAGNOSIS — R1011 Right upper quadrant pain: Secondary | ICD-10-CM | POA: Diagnosis not present

## 2023-10-31 DIAGNOSIS — R262 Difficulty in walking, not elsewhere classified: Secondary | ICD-10-CM | POA: Diagnosis not present

## 2023-10-31 DIAGNOSIS — C259 Malignant neoplasm of pancreas, unspecified: Secondary | ICD-10-CM | POA: Diagnosis not present

## 2023-10-31 DIAGNOSIS — R2689 Other abnormalities of gait and mobility: Secondary | ICD-10-CM | POA: Diagnosis not present

## 2023-10-31 DIAGNOSIS — R109 Unspecified abdominal pain: Secondary | ICD-10-CM | POA: Diagnosis not present

## 2023-10-31 HISTORY — PX: IR CHOLANGIOGRAM EXISTING TUBE: IMG6040

## 2023-10-31 MED ORDER — IOHEXOL 300 MG/ML  SOLN
50.0000 mL | Freq: Once | INTRAMUSCULAR | Status: AC | PRN
  Administered 2023-10-31: 5 mL

## 2023-10-31 NOTE — Telephone Encounter (Signed)
 Called to report his gallbladder drain is leaking again and he is in severe pain and wants to be seen by IR. Sent message to IR scheduler to see if he can be worked in today.  He can come at 2 pm today to [facility] and he will be seen. Notified him via VM as well as son. Was able to speak w/Laura Dyann Glaser and provided her the information. She will be sure to tell him.

## 2023-10-31 NOTE — Procedures (Signed)
 Interventional Radiology Procedure:   Indications: Pancreatic cancer with cholecystostomy tube and right abdominal pain.   Procedure: Cholecystostomy tube injection  Findings: Cholecystostomy is positioned in gallbladder.  Cystic duct is obstructed.   Complications: None     EBL: Minimal  Plan: Will discuss pain management with Dr. Scherrie Curt.  Plan for routine exchanges of cholecystostomy tube.   Devesh Monforte R. Julietta Ogren, MD  Pager: [phone]

## 2023-11-01 ENCOUNTER — Ambulatory Visit (INDEPENDENT_AMBULATORY_CARE_PROVIDER_SITE_OTHER): Admitting: Adult Health

## 2023-11-01 ENCOUNTER — Ambulatory Visit

## 2023-11-01 VITALS — BP 120/70 | HR 62 | Temp 98.1°F | Ht 68.0 in | Wt 198.0 lb

## 2023-11-01 DIAGNOSIS — R1011 Right upper quadrant pain: Secondary | ICD-10-CM

## 2023-11-01 DIAGNOSIS — R1310 Dysphagia, unspecified: Secondary | ICD-10-CM | POA: Diagnosis not present

## 2023-11-01 DIAGNOSIS — G8929 Other chronic pain: Secondary | ICD-10-CM

## 2023-11-01 NOTE — Progress Notes (Signed)
 Subjective:    Patient ID: Marcus [name], male    DOB: 1941/01/09, 83 y.o.   MRN: [id number]  HPI  83 year old male who  has a past medical history of Acute meniscal tear of knee (left), Arthritis, AV block (08/01/2018), Benign essential hypertension (01/18/2007), Bronchiectasis with acute exacerbation (06/23/2020), Chest pain, atypical (08/23/2014), Chronic cough (10/07/2014), Coronary artery disease, Degeneration of lumbar intervertebral disc (10/14/2017), Diverticulosis of colon (07/07/2005), Elevated PSA (06/19/2014), GERD (gastroesophageal reflux disease) (01/18/2007), Gout, unspecified (01/18/2007), Hemorrhoids (01/19/2011), Hiatal hernia, History of colonic polyps (01/18/2007), Insomnia, unspecified (08/21/2007), Iron deficiency anemia, unspecified  (01/19/2011), Lumbar post-laminectomy syndrome (10/14/2017), Lumbar spondylolysis, Lumbar strain (12/14/2011), Obstructive sleep apnea (01/18/2007), Paraesophageal hernia, Primary osteoarthritis of knee (05/23/2015), Prostate cancer (HCC) (2016), S/P knee replacement (05/23/2015), Sensorineural hearing loss, bilateral, Vitamin B12 deficiency, and Vitamin D  deficiency (10/28/2009).  He presents to the office today by himself.  Reports that he feels weak.  He is having significant right sided abdominal pain including pain at the biliary drain site.  He was seen by his oncologist Dr. Scherrie Curt 4 days ago and his oxycodone  was increased from 5 mg to 5 to 10 mg every 6 hours as needed.  He reports that he has been taking 10 mg every 3 hours to get some mild relief from his pain.  He was also placed on hospice care this time.  He reports that he met with hospice earlier today.  He just wants to have some pain relief and is not sure what to do  Other more, he is having a hard time swallowing foods and liquids, dates "I have to chew the food until its much in order for me to swallow it without any problems".  He has not choked but feels as though the food  gets stuck when he swallows.  He will then take a drink of water  and the food will go down without difficulty.  Review of Systems See HPI   Past Medical History:  Diagnosis Date   Acute meniscal tear of knee left   Arthritis    generalized   AV block 08/01/2018   Benign essential hypertension 01/18/2007   Bronchiectasis with acute exacerbation 06/23/2020   Chest pain, atypical 08/23/2014   Chronic cough 10/07/2014   Coronary artery disease    Degeneration of lumbar intervertebral disc 10/14/2017   Diverticulosis of colon 07/07/2005   Elevated PSA 06/19/2014   GERD (gastroesophageal reflux disease) 01/18/2007   Gout, unspecified 01/18/2007   Hemorrhoids 01/19/2011   Hiatal hernia    History of colonic polyps 01/18/2007   Insomnia, unspecified 08/21/2007   Iron deficiency anemia, unspecified  01/19/2011   Lumbar post-laminectomy syndrome 10/14/2017   Lumbar spondylolysis    Lumbar strain 12/14/2011   Obstructive sleep apnea 01/18/2007   uses CPAP nightly   Paraesophageal hernia    large   Primary osteoarthritis of knee 05/23/2015   Prostate cancer (HCC) 2016   S/P knee replacement 05/23/2015   Sensorineural hearing loss, bilateral    Vitamin B12 deficiency    Vitamin D  deficiency 10/28/2009    Social History   Socioeconomic History   Marital status: Widowed    Spouse name: Not on file   Number of children: 4   Years of education: 41   Highest education level: Some college, no degree  Occupational History   Occupation: retired    Associate Professor: [employer]  Tobacco Use   Smoking status: Former    Current packs/day: 0.00  Average packs/day: 2.0 packs/day for 20.0 years (40.0 ttl pk-yrs)    Types: Cigarettes    Start date: 07/05/1954    Quit date: 07/05/1974    Years since quitting: 49.3   Smokeless tobacco: Never  Vaping Use   Vaping status: Never Used  Substance and Sexual Activity   Alcohol use: Yes    Comment: wine   Drug use: No   Sexual activity: Not  Currently  Other Topics Concern   Not on file  Social History Narrative   Recently widowed   Former Smoker    Alcohol use-yes 1-2 drinks per day      Occupation: Retired Associate Professor      Originally from [place], [place] - in [place] > 10 yrs as of 2017      Social Drivers of Health   Financial Resource Strain: Low Risk  (07/28/2021)   Overall Financial Resource Strain (CARDIA)    Difficulty of Paying Living Expenses: Not hard at all  Food Insecurity: No Food Insecurity (10/13/2023)   Hunger Vital Sign    Worried About Running Out of Food in the Last Year: Never true    Ran Out of Food in the Last Year: Never true  Transportation Needs: No Transportation Needs (10/13/2023)   PRAPARE - Administrator, Civil Service (Medical): No    Lack of Transportation (Non-Medical): No  Physical Activity: Inactive (07/28/2021)   Exercise Vital Sign    Days of Exercise per Week: 0 days    Minutes of Exercise per Session: 0 min  Stress: No Stress Concern Present (07/28/2021)   [employer] of Occupational Health - Occupational Stress Questionnaire    Feeling of Stress : Not at all  Social Connections: Moderately Isolated (10/03/2023)   Social Connection and Isolation Panel [NHANES]    Frequency of Communication with Friends and Family: Three times a week    Frequency of Social Gatherings with Friends and Family: More than three times a week    Attends Religious Services: More than 4 times per year    Active Member of Clubs or Organizations: No    Attends Banker Meetings: Never    Marital Status: Widowed  Intimate Partner Violence: Not At Risk (10/13/2023)   Humiliation, Afraid, Rape, and Kick questionnaire    Fear of Current or Ex-Partner: No    Emotionally Abused: No    Physically Abused: No    Sexually Abused: No    Past Surgical History:  Procedure Laterality Date   BILIARY BRUSHING  12/13/2022   Procedure: BILIARY BRUSHING;  Surgeon: Kenney Peacemaker, MD;  Location: [facility]  ENDOSCOPY;  Service: Gastroenterology;;   BILIARY STENT PLACEMENT  12/13/2022   Procedure: BILIARY STENT PLACEMENT;  Surgeon: Kenney Peacemaker, MD;  Location: [facility] ENDOSCOPY;  Service: Gastroenterology;;   BILIARY STENT PLACEMENT N/A 02/07/2023   Procedure: BILIARY STENT PLACEMENT;  Surgeon: Normie Becton., MD;  Location: [facility] ENDOSCOPY;  Service: Gastroenterology;  Laterality: N/A;   BIOPSY  12/23/2022   Procedure: BIOPSY;  Surgeon: Brice Campi Albino Alu., MD;  Location: [facility] ENDOSCOPY;  Service: Gastroenterology;;   BIOPSY  09/05/2023   Procedure: BIOPSY;  Surgeon: Normie Becton., MD;  Location: [facility] ENDOSCOPY;  Service: Gastroenterology;;   BRONCHIAL WASHINGS  10/30/2019   Procedure: BRONCHIAL WASHINGS;  Surgeon: Joesph Mussel, DO;  Location: [facility] ENDOSCOPY;  Service: Endoscopy;;   CARDIAC CATHETERIZATION     CATARACT EXTRACTION W/ INTRAOCULAR LENS  IMPLANT, BILATERAL Bilateral    COLONOSCOPY  DUODENAL STENT PLACEMENT N/A 09/05/2023   Procedure: INSERTION, STENT, DUODENUM;  Surgeon: Normie Becton., MD;  Location: [facility] ENDOSCOPY;  Service: Gastroenterology;  Laterality: N/A;   ENDOSCOPIC RETROGRADE CHOLANGIOPANCREATOGRAPHY (ERCP) WITH PROPOFOL  N/A 02/07/2023   Procedure: ENDOSCOPIC RETROGRADE CHOLANGIOPANCREATOGRAPHY (ERCP) WITH PROPOFOL ;  Surgeon: Brice Campi Albino Alu., MD;  Location: [facility] ENDOSCOPY;  Service: Gastroenterology;  Laterality: N/A;  Stent change   ERCP N/A 12/13/2022   Procedure: ENDOSCOPIC RETROGRADE CHOLANGIOPANCREATOGRAPHY (ERCP);  Surgeon: Kenney Peacemaker, MD;  Location: [facility] ENDOSCOPY;  Service: Gastroenterology;  Laterality: N/A;  with stent placement   ESOPHAGOGASTRODUODENOSCOPY     ESOPHAGOGASTRODUODENOSCOPY (EGD) WITH PROPOFOL  N/A 12/23/2022   Procedure: ESOPHAGOGASTRODUODENOSCOPY (EGD) WITH PROPOFOL ;  Surgeon: Brice Campi Albino Alu., MD;  Location: [facility] ENDOSCOPY;  Service: Gastroenterology;  Laterality: N/A;   ESOPHAGOGASTRODUODENOSCOPY (EGD) WITH PROPOFOL  N/A  09/05/2023   Procedure: ESOPHAGOGASTRODUODENOSCOPY (EGD) WITH PROPOFOL ;  Surgeon: Brice Campi Albino Alu., MD;  Location: [facility] ENDOSCOPY;  Service: Gastroenterology;  Laterality: N/A;   EUS N/A 12/23/2022   Procedure: UPPER ENDOSCOPIC ULTRASOUND (EUS) RADIAL;  Surgeon: Normie Becton., MD;  Location: [facility] ENDOSCOPY;  Service: Gastroenterology;  Laterality: N/A;   FINE NEEDLE ASPIRATION  12/23/2022   Procedure: FINE NEEDLE ASPIRATION (FNA) RADIAL;  Surgeon: Normie Becton., MD;  Location: [facility] ENDOSCOPY;  Service: Gastroenterology;;   IR CHOLANGIOGRAM EXISTING TUBE  10/31/2023   IR IMAGING GUIDED PORT INSERTION  01/04/2023   IR PATIENT EVAL TECH 0-60 MINS  10/28/2023   IR PERC CHOLECYSTOSTOMY  09/03/2023   IR PERC CHOLECYSTOSTOMY  10/04/2023   IR SINUS/FIST TUBE CHK-NON GI  09/22/2023   KNEE ARTHROSCOPY Right 2007   KNEE ARTHROSCOPY  07/13/2011   Procedure: ARTHROSCOPY KNEE;  Surgeon: Valdene Garret;  Location: [facility];  Service: Orthopedics;  Laterality: Left;  partial menisectomy with chondrylplasty   KNEE SURGERY Left    LAMINECTOMY AND MICRODISCECTOMY LUMBAR SPINE  MARCH  2008   L3 -  4   LAPAROSCOPIC INCISIONAL / UMBILICAL / VENTRAL HERNIA REPAIR  2006   PACEMAKER IMPLANT N/A 08/02/2018   Procedure: PACEMAKER IMPLANT;  Surgeon: Tammie Fall, MD;  Location: [facility] INVASIVE CV LAB;  Service: Cardiovascular;  Laterality: N/A;   PROSTATE BIOPSY     RADIOACTIVE SEED IMPLANT N/A 02/12/2019   Procedure: RADIOACTIVE SEED IMPLANT/BRACHYTHERAPY IMPLANT;  Surgeon: Trent Frizzle, MD;  Location: [facility] ORS;  Service: Urology;  Laterality: N/A;  90 MINS   REMOVAL OF STONES  02/07/2023   Procedure: REMOVAL OF Sludge;  Surgeon: Brice Campi Albino Alu., MD;  Location: [facility] ENDOSCOPY;  Service: Gastroenterology;;   SHOULDER ARTHROSCOPY Right 05-23-2007   SIGMOID COLECTOMY FOR CANCER  1989   SPACE OAR INSTILLATION N/A 02/12/2019   Procedure: SPACE OAR INSTILLATION;  Surgeon: Trent Frizzle, MD;  Location: [facility] ORS;  Service: Urology;  Laterality: N/A;   SPHINCTEROTOMY  12/13/2022   Procedure: SPHINCTEROTOMY;  Surgeon: Kenney Peacemaker, MD;  Location: [facility] ENDOSCOPY;  Service: Gastroenterology;;   [place] REMOVAL  02/07/2023   Procedure: STENT REMOVAL;  Surgeon: Normie Becton., MD;  Location: [facility] ENDOSCOPY;  Service: Gastroenterology;;   TOTAL KNEE ARTHROPLASTY Left 05/23/2015   Procedure: LEFT TOTAL KNEE ARTHROPLASTY;  Surgeon: Genevie Kerns, MD;  Location: [facility] ORS;  Service: Orthopedics;  Laterality: Left;   VERTICAL BANDED GASTROPLASTY  1986   VIDEO BRONCHOSCOPY N/A 10/30/2019   Procedure: VIDEO BRONCHOSCOPY WITH BRONICAL ALVEROLAR LAVAGE WITHOUT FLUORO;  Surgeon: Joesph Mussel, DO;  Location: [facility] ENDOSCOPY;  Service: Endoscopy;  Laterality: N/A;  Family History  Problem Relation Age of Onset   Stroke Paternal Grandfather    Heart disease Paternal Grandfather    Esophagitis Father        died from perforated esophagus   Breast cancer Mother    Colon cancer Maternal Grandmother        questionable   Esophageal cancer Neg Hx    Prostate cancer Neg Hx    Stomach cancer Neg Hx     No Known Allergies  Current Outpatient Medications on File Prior to Visit  Medication Sig Dispense Refill   acetaminophen  (TYLENOL ) 325 MG tablet Take 2 tablets (650 mg total) by mouth every 6 (six) hours as needed for mild pain (or Fever >/= 101). 20 tablet 0   apixaban  (ELIQUIS ) 5 MG TABS tablet Take 1 tablet (5 mg total) by mouth 2 (two) times daily. 180 tablet 1   cyanocobalamin  (VITAMIN B12) 500 MCG tablet Take 1 tablet by mouth 2 (two) times daily.     eszopiclone  3 MG TABS Take 1 tablet (3 mg total) by mouth at bedtime. Take immediately before bedtime. (Patient taking differently: Take 3 mg by mouth at bedtime as needed (sleep).) 30 tablet 2   furosemide  (LASIX ) 20 MG tablet Take 20 mg by mouth daily.     LORazepam  (ATIVAN ) 0.5 MG tablet Take 1 tablet (0.5 mg total) by mouth  every 8 (eight) hours as needed for anxiety. 30 tablet 0   oxybutynin  (DITROPAN ) 5 MG tablet Take 5 mg by mouth 2 (two) times daily as needed for bladder spasms (urinary incontinence).     oxyCODONE  (ROXICODONE ) 5 MG immediate release tablet Take 1-2 tablets (5-10 mg total) by mouth every 6 (six) hours as needed for severe pain (pain score 7-10). 60 tablet 0   Polyethylene Glycol 3350  (MIRALAX  PO) Take 17 g by mouth daily as needed.     tamsulosin  (FLOMAX ) 0.4 MG CAPS capsule Take 1 capsule by mouth daily.     No current facility-administered medications on file prior to visit.    BP 120/70   Pulse 62   Temp 98.1 F (36.7 C) (Oral)   Ht 5\' 8"  (1.727 m)   Wt 198 lb (89.8 kg)   SpO2 97%   BMI 30.11 kg/m       Objective:   Physical Exam Vitals and nursing note reviewed.  Constitutional:      Appearance: Normal appearance. He is cachectic. He is ill-appearing (chronically).  Cardiovascular:     Rate and Rhythm: Normal rate and regular rhythm.     Pulses: Normal pulses.     Heart sounds: Normal heart sounds.  Pulmonary:     Effort: Pulmonary effort is normal.     Breath sounds: Normal breath sounds.  Skin:    General: Skin is warm and dry.  Neurological:     General: No focal deficit present.     Mental Status: He is oriented to person, place, and time.  Psychiatric:        Mood and Affect: Mood normal.        Behavior: Behavior normal.        Thought Content: Thought content normal.        Judgment: Judgment normal.       Assessment & Plan:  1. Chronic RUQ pain (Primary) - Will reach out to Dr. Scherrie Curt about pain management for this patient  2. Dysphagia, unspecified type - Discussed chin to chest swallowing - Take smaller bites and drink plenty  of water  with food  Alto Atta, NP  Time spent with patient today was 35 minutes which consisted of chart review, discussing chronic RUQ pain and dysphagia, work up, treatment answering questions and  documentation.

## 2023-11-02 ENCOUNTER — Encounter: Payer: Self-pay | Admitting: *Deleted

## 2023-11-02 ENCOUNTER — Ambulatory Visit

## 2023-11-02 ENCOUNTER — Telehealth: Payer: Self-pay

## 2023-11-02 ENCOUNTER — Other Ambulatory Visit (HOSPITAL_BASED_OUTPATIENT_CLINIC_OR_DEPARTMENT_OTHER): Payer: Self-pay

## 2023-11-02 MED ORDER — OXYCODONE HCL 10 MG PO TABS
10.0000 mg | ORAL_TABLET | ORAL | 0 refills | Status: DC
  Filled 2023-11-02: qty 60, 10d supply, fill #0

## 2023-11-02 MED ORDER — SENNOSIDES 8.6 MG PO TABS
2.0000 | ORAL_TABLET | Freq: Two times a day (BID) | ORAL | 3 refills | Status: DC
Start: 2023-11-02 — End: 2023-12-23
  Filled 2023-11-02: qty 60, 15d supply, fill #0

## 2023-11-02 NOTE — Progress Notes (Signed)
 Noted patient was to have 1st visit with home hospice on 11/01/23. Faxed note to [facility] requesting RN to assess his pain level. May need to start long acting narcotic.

## 2023-11-02 NOTE — Telephone Encounter (Signed)
 Cathleen Coach RN from [facility]/hospice called to verify patients pharmacy in order to send in some medication. Made aware patient uses [facility], understood. No further questions.

## 2023-11-03 ENCOUNTER — Ambulatory Visit

## 2023-11-04 ENCOUNTER — Ambulatory Visit

## 2023-11-07 ENCOUNTER — Ambulatory Visit

## 2023-11-08 ENCOUNTER — Telehealth: Payer: Self-pay

## 2023-11-08 NOTE — Telephone Encounter (Signed)
 Copied from CRM [id number]. Topic: Clinical - Medical Advice >> Nov 08, 2023  9:21 AM Dewanda Foots wrote: Reason for CRM: Pt called in wanting to know if he was being placed on Hospice or not. Pt states there was some confusion and that this is something Randel Buss was working on for him. States he would really like to speak to [place] if at all possible sometime today.   His phone number is  [phone].

## 2023-11-08 NOTE — Telephone Encounter (Signed)
**Note De-identified  [place] Obfuscation** Please advise 

## 2023-11-09 ENCOUNTER — Telehealth: Payer: Self-pay | Admitting: Gastroenterology

## 2023-11-09 NOTE — Telephone Encounter (Signed)
 error

## 2023-11-09 NOTE — Telephone Encounter (Signed)
 Shannon with Hospice is calling because the PT is having difficulty swallowing pills as well as liquids. The PT is very concerned. Would like to discuss prior EGD to see if there were any issues presented that could cause this. Please advise.

## 2023-11-09 NOTE — Telephone Encounter (Signed)
 Left message on machine to call back

## 2023-11-10 NOTE — Telephone Encounter (Signed)
 Shon do you know the phone number for [facility] or hospice?

## 2023-11-10 NOTE — Telephone Encounter (Signed)
 No I dont. Cathleen Coach was with the patient but he specifically asked that we call him. [phone]

## 2023-11-12 ENCOUNTER — Other Ambulatory Visit (HOSPITAL_BASED_OUTPATIENT_CLINIC_OR_DEPARTMENT_OTHER): Payer: Self-pay

## 2023-11-12 MED ORDER — PROCHLORPERAZINE MALEATE 10 MG PO TABS
10.0000 mg | ORAL_TABLET | ORAL | 2 refills | Status: DC | PRN
  Filled 2023-11-12: qty 30, 5d supply, fill #0

## 2023-11-14 ENCOUNTER — Ambulatory Visit (INDEPENDENT_AMBULATORY_CARE_PROVIDER_SITE_OTHER): Payer: Medicare Other

## 2023-11-14 DIAGNOSIS — I4430 Unspecified atrioventricular block: Secondary | ICD-10-CM

## 2023-11-14 NOTE — Telephone Encounter (Signed)
 I was able to contact the pt and he tells me that he is doing well at this time and does not have any concerns.  He will call back if he develops any further issues swallowing.

## 2023-11-15 ENCOUNTER — Other Ambulatory Visit: Payer: Self-pay | Admitting: Interventional Radiology

## 2023-11-15 LAB — CUP PACEART REMOTE DEVICE CHECK
Date Time Interrogation Session: 20250512081532
Implantable Lead Connection Status: 753985
Implantable Lead Connection Status: 753985
Implantable Lead Implant Date: 20200129
Implantable Lead Implant Date: 20200129
Implantable Lead Location: 753859
Implantable Lead Location: 753860
Implantable Lead Model: 377
Implantable Lead Model: 377
Implantable Lead Serial Number: 80947537
Implantable Lead Serial Number: 80970966
Implantable Pulse Generator Implant Date: 20200129
Pulse Gen Model: 407145
Pulse Gen Serial Number: 69503797

## 2023-11-16 ENCOUNTER — Ambulatory Visit (HOSPITAL_COMMUNITY)
Admission: RE | Admit: 2023-11-16 | Discharge: 2023-11-16 | Disposition: A | Discharge status: Home / Self Care | Source: Ambulatory Visit | Attending: Radiology | Admitting: Radiology

## 2023-11-16 ENCOUNTER — Other Ambulatory Visit (HOSPITAL_BASED_OUTPATIENT_CLINIC_OR_DEPARTMENT_OTHER): Payer: Self-pay

## 2023-11-16 ENCOUNTER — Other Ambulatory Visit (HOSPITAL_COMMUNITY)

## 2023-11-16 ENCOUNTER — Other Ambulatory Visit (HOSPITAL_COMMUNITY): Payer: Self-pay | Admitting: Radiology

## 2023-11-16 DIAGNOSIS — K819 Cholecystitis, unspecified: Secondary | ICD-10-CM | POA: Diagnosis not present

## 2023-11-16 DIAGNOSIS — Z434 Encounter for attention to other artificial openings of digestive tract: Secondary | ICD-10-CM | POA: Diagnosis not present

## 2023-11-16 HISTORY — PX: IR EXCHANGE BILIARY DRAIN: IMG6046

## 2023-11-16 MED ORDER — IOHEXOL 300 MG/ML  SOLN
50.0000 mL | Freq: Once | INTRAMUSCULAR | Status: AC | PRN
  Administered 2023-11-16: 10 mL

## 2023-11-16 MED ORDER — LIDOCAINE HCL 1 % IJ SOLN
INTRAMUSCULAR | Status: AC
  Filled 2023-11-16: qty 20

## 2023-11-16 MED ORDER — LIDOCAINE HCL 1 % IJ SOLN
20.0000 mL | Freq: Once | INTRAMUSCULAR | Status: AC
  Administered 2023-11-16: 10 mL via INTRADERMAL

## 2023-11-17 ENCOUNTER — Encounter (HOSPITAL_BASED_OUTPATIENT_CLINIC_OR_DEPARTMENT_OTHER): Payer: Self-pay | Admitting: Pulmonary Disease

## 2023-11-17 ENCOUNTER — Other Ambulatory Visit (HOSPITAL_BASED_OUTPATIENT_CLINIC_OR_DEPARTMENT_OTHER): Payer: Self-pay

## 2023-11-17 ENCOUNTER — Ambulatory Visit (INDEPENDENT_AMBULATORY_CARE_PROVIDER_SITE_OTHER): Payer: Non-veteran care | Admitting: Pulmonary Disease

## 2023-11-17 VITALS — BP 126/74 | HR 75 | Ht 68.0 in | Wt 183.8 lb

## 2023-11-17 DIAGNOSIS — R053 Chronic cough: Secondary | ICD-10-CM

## 2023-11-17 DIAGNOSIS — C259 Malignant neoplasm of pancreas, unspecified: Secondary | ICD-10-CM

## 2023-11-17 DIAGNOSIS — G4733 Obstructive sleep apnea (adult) (pediatric): Secondary | ICD-10-CM | POA: Diagnosis not present

## 2023-11-17 MED ORDER — SENNOSIDES 8.6 MG PO TABS
2.0000 | ORAL_TABLET | Freq: Two times a day (BID) | ORAL | 3 refills | Status: DC | PRN
  Filled 2023-11-17 (×2): qty 60, 15d supply, fill #0

## 2023-11-17 MED ORDER — FUROSEMIDE 20 MG PO TABS
20.0000 mg | ORAL_TABLET | ORAL | 0 refills | Status: DC | PRN
  Filled 2023-11-17: qty 30, 30d supply, fill #0

## 2023-11-17 NOTE — Patient Instructions (Addendum)
 Trial of Mucinex  cough syrup - 5ml twice daily x 2 weeks , then as needed  OK to stay off CPAP

## 2023-11-17 NOTE — Progress Notes (Signed)
 Subjective:    Patient ID: Marcus [name], male    DOB: March 04, 1941, 83 y.o.   MRN: [id number]  HPI  83 yo for follow-up of chronic cough and OSA Extensive evaluation only showed mild bronchiectasis.   PMH - Pancreas cancer  bariatric surgery He has undergone ENT and GI evaluation for GERD. He underwent extensive evaluation for chronic productive cough with sticky white sputum, BAL was negative for AFB CT only showed very mild bronchiectasis and faint groundglass opacity right upper lobe with a pulmonary nodule    8 month FU visit presents with thick expectorant production and sleep apnea management. He is accompanied by his son, also named Drexel Gentles.  He produces a thick expectorant that is difficult to expel, almost causing choking, occurring daily, especially after meals. The expectorant is sometimes white or brownish, with occasional nasal drainage. He has not used treatments like Mucinex .  He has sleep apnea and previously used a CPAP machine, which he discontinued due to discomfort and noise. Despite this, he sleeps well, achieving 7 to 9 hours per night. He has lost about 70 pounds since his pancreatic cancer diagnosis, which may have improved his sleep apnea. His son notes a reduction in loud snoring. Significant tests/ events reviewed   PFTs 08/2020 nml   10/30/2019 BAL cell count with differential: 185 cells, 2% PMNs, 80% monocytes, 15% lymphocytes   Micro: 10/30/19 BAL culture-normal flora 10/30/19 BAL fungus-KOH smear negative 10/30/2019 BAL AFB- negative     HRCT chest 09/27/19- Scattered groundglass opacities and tree-in-bud opacities.  Bilateral basilar linear scars.  Mild bronchiectasis bilateral lower lobes, right middle lobe, right upper lobe, lingula.  Minimal air-trapping.   Likely small hiatal hernia.    03/2022 CT sinuses >> minimal mucosal thickening  Review of Systems neg for any significant sore throat, dysphagia, itching, sneezing, nasal congestion or excess/  purulent secretions, fever, chills, sweats, unintended wt loss, pleuritic or exertional cp, hempoptysis, orthopnea pnd or change in chronic leg swelling. Also denies presyncope, palpitations, heartburn, abdominal pain, nausea, vomiting, diarrhea or change in bowel or urinary habits, dysuria,hematuria, rash, arthralgias, visual complaints, headache, numbness weakness or ataxia.     Objective:   Physical Exam  Gen. Pleasant, well-nourished, in no distress ENT - no thrush, no pallor/icterus,no post nasal drip Neck: No JVD, no thyromegaly, no carotid bruits Lungs: no use of accessory muscles, no dullness to percussion, clear without rales or rhonchi  Cardiovascular: Rhythm regular, heart sounds  normal, no murmurs or gallops, no peripheral edema Musculoskeletal: No deformities, no cyanosis or clubbing        Assessment & Plan:  Chronic cough with thick expectorant Chronic cough with production of thick expectorant, particularly postprandial. Expectorant is difficult to expectorate and is described as white to brownish. No nasal drainage reported. Management includes Mucinex  and a floral valve to aid in airway clearance. Consider antibiotics if expectorant becomes yellow or green, indicating possible infection. - Recommend Mucinex  600 mg once daily, increasing to twice daily if needed, or use Mucinex  liquid form for easier administration. - Provide a floral valve to assist in clearing airways. - Monitor expectorant color and contact provider if it becomes yellow or green.  Pancreatic cancer Pancreatic cancer with associated weight loss of approximately seventy pounds. Currently has a gallbladder tube in place due to inflammation and infection, managed by the drain clinic at [facility]. Follow-up planned in two months to assess the possibility of tube removal.  OSA Sleep apnea appears improved following significant weight  loss. Previously used CPAP machine but discontinued due to discomfort and  improved sleep quality without it. No loud snoring reported. He prefers not to undergo further testing, and this is acceptable given the current improvement in symptoms.

## 2023-11-18 ENCOUNTER — Inpatient Hospital Stay: Attending: Oncology

## 2023-11-18 ENCOUNTER — Inpatient Hospital Stay

## 2023-11-18 ENCOUNTER — Inpatient Hospital Stay (HOSPITAL_BASED_OUTPATIENT_CLINIC_OR_DEPARTMENT_OTHER): Admitting: Oncology

## 2023-11-18 ENCOUNTER — Other Ambulatory Visit (HOSPITAL_BASED_OUTPATIENT_CLINIC_OR_DEPARTMENT_OTHER): Payer: Self-pay

## 2023-11-18 ENCOUNTER — Telehealth: Payer: Self-pay | Admitting: Oncology

## 2023-11-18 VITALS — BP 122/65 | HR 62 | Temp 98.2°F | Resp 18 | Ht 68.0 in | Wt 185.2 lb

## 2023-11-18 DIAGNOSIS — F32A Depression, unspecified: Secondary | ICD-10-CM | POA: Insufficient documentation

## 2023-11-18 DIAGNOSIS — Z923 Personal history of irradiation: Secondary | ICD-10-CM | POA: Diagnosis not present

## 2023-11-18 DIAGNOSIS — Z95828 Presence of other vascular implants and grafts: Secondary | ICD-10-CM

## 2023-11-18 DIAGNOSIS — Z85038 Personal history of other malignant neoplasm of large intestine: Secondary | ICD-10-CM

## 2023-11-18 DIAGNOSIS — Z9221 Personal history of antineoplastic chemotherapy: Secondary | ICD-10-CM | POA: Diagnosis not present

## 2023-11-18 DIAGNOSIS — Z9884 Bariatric surgery status: Secondary | ICD-10-CM | POA: Insufficient documentation

## 2023-11-18 DIAGNOSIS — K831 Obstruction of bile duct: Secondary | ICD-10-CM | POA: Insufficient documentation

## 2023-11-18 DIAGNOSIS — C250 Malignant neoplasm of head of pancreas: Secondary | ICD-10-CM

## 2023-11-18 DIAGNOSIS — D649 Anemia, unspecified: Secondary | ICD-10-CM

## 2023-11-18 LAB — CBC WITH DIFFERENTIAL (CANCER CENTER ONLY)
Abs Immature Granulocytes: 0.02 10*3/uL (ref 0.00–0.07)
Basophils Absolute: 0 10*3/uL (ref 0.0–0.1)
Basophils Relative: 0 %
Eosinophils Absolute: 0.1 10*3/uL (ref 0.0–0.5)
Eosinophils Relative: 1 %
HCT: 28.2 % — ABNORMAL LOW (ref 39.0–52.0)
Hemoglobin: 8.9 g/dL — ABNORMAL LOW (ref 13.0–17.0)
Immature Granulocytes: 1 %
Lymphocytes Relative: 14 %
Lymphs Abs: 0.5 10*3/uL — ABNORMAL LOW (ref 0.7–4.0)
MCH: 27.1 pg (ref 26.0–34.0)
MCHC: 31.6 g/dL (ref 30.0–36.0)
MCV: 85.7 fL (ref 80.0–100.0)
Monocytes Absolute: 0.4 10*3/uL (ref 0.1–1.0)
Monocytes Relative: 11 %
Neutro Abs: 2.6 10*3/uL (ref 1.7–7.7)
Neutrophils Relative %: 73 %
Platelet Count: 172 10*3/uL (ref 150–400)
RBC: 3.29 MIL/uL — ABNORMAL LOW (ref 4.22–5.81)
RDW: 18.6 % — ABNORMAL HIGH (ref 11.5–15.5)
WBC Count: 3.5 10*3/uL — ABNORMAL LOW (ref 4.0–10.5)
nRBC: 0 % (ref 0.0–0.2)

## 2023-11-18 LAB — SAMPLE TO BLOOD BANK

## 2023-11-18 MED ORDER — HEPARIN SOD (PORK) LOCK FLUSH 100 UNIT/ML IV SOLN
500.0000 [IU] | Freq: Once | INTRAVENOUS | Status: AC
  Administered 2023-11-18: 500 [IU] via INTRAVENOUS

## 2023-11-18 MED ORDER — SODIUM CHLORIDE 0.9% FLUSH
10.0000 mL | Freq: Once | INTRAVENOUS | Status: AC
  Administered 2023-11-18: 10 mL via INTRAVENOUS

## 2023-11-18 NOTE — Telephone Encounter (Signed)
 Patient has been scheduled for follow-up visit per 11/17/23 LOS.  LVM notifying pt of appt details, provided my direct number to pt if appt changes need to be made.

## 2023-11-18 NOTE — Progress Notes (Signed)
 [facility] OFFICE PROGRESS NOTE   Diagnosis: Pancreas cancer  INTERVAL HISTORY:   Marcus [name] returns as scheduled.  He is enrolled in home hospice care.  He takes oxycodone  approximately 4 times daily for relief of abdominal pain.  He has a poor appetite.  He has developed a high-pitched voice.  No bleeding. He underwent exchange of the cholecystostomy tube on 11/16/2023.  No fever.    Objective:  Vital signs in last 24 hours:  Blood pressure 122/65, pulse 62, temperature 98.2 F (36.8 C), temperature source Temporal, resp. rate 18, height 5\' 8"  (1.727 m), weight 185 lb 3.2 oz (84 kg), SpO2 98%.    HEENT: No thrush Resp: Lungs clear bilaterally Cardio: Regular rate and rhythm GI: No hepatosplenomegaly, no mass, right chest/upper abdomen biliary drain with a gauze dressing Vascular: 1+ edema to lower leg bilaterally   Portacath/PICC-without erythema  Lab Results:  Lab Results  Component Value Date   WBC 3.5 (L) 11/18/2023   HGB 8.9 (L) 11/18/2023   HCT 28.2 (L) 11/18/2023   MCV 85.7 11/18/2023   PLT 172 11/18/2023   NEUTROABS 2.6 11/18/2023    CMP  Lab Results  Component Value Date   NA 136 10/28/2023   K 3.9 10/28/2023   CL 103 10/28/2023   CO2 24 10/28/2023   GLUCOSE 137 (H) 10/28/2023   BUN 22 10/28/2023   CREATININE 1.28 (H) 10/28/2023   CALCIUM  8.8 (L) 10/28/2023   PROT 5.6 (L) 10/28/2023   ALBUMIN 2.8 (L) 10/28/2023   AST 19 10/28/2023   ALT 8 10/28/2023   ALKPHOS 62 10/28/2023   BILITOT 0.6 10/28/2023   GFRNONAA 56 (L) 10/28/2023   GFRAA >60 02/07/2019    Lab Results  Component Value Date   CEA1 6.4 (H) 12/11/2022   CEA 1.1 03/03/2010   CAN199 1,930 (H) 10/28/2023      Imaging:  IR EXCHANGE BILIARY DRAIN Result Date: 11/16/2023 INDICATION: History of acute cholecystitis, post cholecystostomy tube placement on 10/04/2023. EXAM: FLUOROSCOPIC GUIDED CHOLECYSTOSTOMY TUBE EXCHANGE COMPARISON:  IR fluoroscopy, 10/31/2023 and  10/04/2023. CT AP, 10/05/2023. MEDICATIONS: None ANESTHESIA/SEDATION: Local anesthetic was administered. CONTRAST:  10mL OMNIPAQUE  IOHEXOL  300 MG/ML SOLN - administered into the gallbladder lumen. FLUOROSCOPY: Radiation Exposure Index and estimated peak skin dose (PSD); Reference air kerma (RAK), 10 mGy. COMPLICATIONS: None immediate. PROCEDURE: The patient was positioned supine on the fluoroscopy table. The external portion of the existing cholecystostomy tube as well as the surrounding skin was prepped and draped in usual sterile fashion. A time-out was performed prior to the initiation of the procedure. A preprocedural spot fluoroscopic image was obtained of the right upper abdominal quadrant existing cholecystostomy tube. The skin surrounding the cholecystostomy tube was anesthetized with 1% lidocaine  with epinephrine . The external portion of the cholecystostomy tube was cut and cannulated with a short Amplatz wire which was advanced through the tube and coiled within the gallbladder lumen Next, under intermittent fluoroscopic guidance, the existing 10 Fr cholecystostomy tube was exchanged for a new 10 Fr cholecystostomy tube which was repositioned into the more central aspect of the gallbladder lumen. Contrast injection confirms appropriate positioning and functionality of the cholecystomy tube. The cholecystostomy tube was flushed with a small amount of saline and reconnected to a gravity bag. The cholecystostomy tube was secured with an interrupted suture and a Stat Lock device. A dressing was applied. The patient tolerated the procedure well without immediate postprocedural complication. FINDINGS: Preprocedural spot fluoroscopic image demonstrates unchanged positioning of cholecystostomy tube  with end coiled and locked over the expected location of the fundus of the gallbladder After fluoroscopic guided exchange, the new 10 Fr cholecystostomy tube is more ideally positioned with end coiled and locked within  the central aspect of the gallbladder lumen. Post exchange cholangiogram demonstrates appropriate positioning and functionality of the new cholecystostomy tube. IMPRESSION: 1. Successful fluoroscopic guided exchange and repositioning for a new 10 Fr cholecystostomy tube. 2. Interval recanalization of the cystic duct. PLAN: The patient will return to V[facility]iology (VIR) for routine drainage catheter evaluation and exchange in 6-8 weeks. Art Largo, MD [facility] Electronically Signed   By: Art Largo M.D.   On: 11/16/2023 18:27    Medications: I have reviewed the patient's current medications.   Assessment/Plan: Pancreas cancer CT abdomen/pelvis 12/03/2022-suspected pancreas mass in the proximal body measuring 2 cm with probable mass effect on the superior aspect of the SMV, multiple hypodense liver lesions, many are new from a remote CT-indeterminate, 2 cystic lesions in the pancreas tail-nonspecific prior bariatric surgery ERCP 12/13/2022-malignant appearing common bile duct stricture, plastic stent placed, brushing cytology-no malignant cells EUS 12/23/2022-30 x 33 mm pancreas head mass with invasion of the SMV, no malignant appearing lymph nodes, 4 cysts in the visualized liver, FNA-adenocarcinoma Cycle 1 gemcitabine  and abraxane  01/07/2023 CTs 01/09/2023-multiple low-attenuation liver lesions unchanged, largest of which are clearly benign fluid attenuation cyst, others more ill-defined and incompletely characterized suspicious for small metastases.  Similar appearance of hypodense mass in the pancreatic head.  Distended gallbladder with mild gallbladder wall thickening and adjacent pericholecystic fat stranding.  Sludge in the gallbladder fundus.  Common bile duct stent in place with diminished, persistent intrahepatic biliary ductal dilatation.  Negative for pulmonary embolism.  Acute appearing heterogeneous airspace opacity  of the superior segment left lower lobe consistent with infection or aspiration. Cycle 2 gemcitabine  and abraxane  01/21/2023, gemcitabine  dose reduced due to neutropenia following cycle 1 Cycle 3 gemcitabine /Abraxane  02/04/2023 Cycle 4 gemcitabine /Abraxane  02/17/2023 Cycle 5 gemcitabine /Abraxane  03/04/2023 Cycle 6 gemcitabine /Abraxane  03/17/2023 CT abdomen/pelvis 03/28/2023-stable pancreas mass, multiple small hypoattenuating liver lesions (review of CTs at the GI tumor conference is most consistent with liver metastases-some smaller compared to the baseline CT) Cycle 7 gemcitabine /Abraxane  04/01/2023 Cycle 8 gemcitabine /Abraxane  04/15/2023 Cycle 9 gemcitabine /Abraxane  04/29/2023 Treatment held per patient request 05/12/2023 Cycle 10 gemcitabine /Abraxane  05/27/2023 Cycle 11 gemcitabine /Abraxane  06/10/2023 Cycle 12 gemcitabine /Abraxane  06/24/2023 CTs 07/01/2023-no significant difference in size of the mass in the pancreatic head.  Redemonstration of multiple hypoattenuating liver lesions essentially unchanged and favored to represent simple cysts. 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, cholelithiasis and mild pericholecystic inflammatory stranding Plan for 5-1/2-week course of radiation plus concurrent Xeloda , start date 09/15/2023-09/27/2023 (discontinued per patient) Biliary obstruction secondary to 1-status post ERCP/stent placement 12/13/2022 Coronary artery  disease Atrial fibrillation Gout Sleep apnea Status post bariatric surgery Hypertension Bronchiectasis Gastroesophageal reflux disease History of colon cancer in his 40s Complete heart block-pacemaker placed Admission 01/09/2023 with a fever and failure to thrive, CT chest with evidence of left lung pneumonia, CT abdomen/pelvis with  a distended gallbladder and adjacent fat stranding Anemia 08/07/2023: Acute onset nausea/vomiting and abdominal pain-resolved after Zofran /morphine  in the emergency room, CT without acute findings Depression - he has high score PHQ-9 today, no SI, plan, or access today. He declined referral to mental health provider. Has a trusting relationship with PCP and agrees to call him soon to discuss his mental health  Admission 09/01/2023 with intractable nausea/vomiting and abdominal distention CT abdomen/pelvis-dilated stomach, wall thickening of the duodenum, cholelithiasis with inflammatory stranding surrounding gallbladder, common bile duct stent unchanged 09/03/2023-percutaneous cholecystostomy tube 09/05/2023-upper endoscopy: Congestion, friability in the duodenal bulb and duodenal sweep, severe stenosis within the pyloric channel/duodenal bulb/duodenal sweep, wall flex stent placed, duodenal biopsy-nonspecific duodenitis, negative for dysplasia or malignancy 09/22/2023-retracted cholecystostomy tube in the periphery of the right liver 10/04/2023-cholecystostomy tube, cholecystostomy tube exchange 11/16/2023      Disposition: Marcus Shotts has a history of locally advanced pancreas cancer.  He is currently enrolled in hospice care.  A cholecystostomy tube remains in place.  He has persistent anemia secondary to chronic disease and probable chronic GI bleeding from the duodenum.  He will continue follow-up with a home hospice RN.  He will continue the current pain regimen.  He will contact us  as needed.  He will return for an office visit and Port-A-Cath flush in approximately 5  weeks.  Coni Deep, MD  11/18/2023  9:26 AM

## 2023-11-21 ENCOUNTER — Ambulatory Visit: Payer: Self-pay | Admitting: Internal Medicine

## 2023-11-23 ENCOUNTER — Other Ambulatory Visit (HOSPITAL_BASED_OUTPATIENT_CLINIC_OR_DEPARTMENT_OTHER): Payer: Self-pay

## 2023-11-23 ENCOUNTER — Telehealth: Payer: Self-pay | Admitting: *Deleted

## 2023-11-23 ENCOUNTER — Ambulatory Visit (INDEPENDENT_AMBULATORY_CARE_PROVIDER_SITE_OTHER): Admitting: Adult Health

## 2023-11-23 ENCOUNTER — Other Ambulatory Visit: Payer: Self-pay | Admitting: Adult Health

## 2023-11-23 VITALS — BP 100/60 | HR 64 | Temp 98.0°F | Ht 68.0 in | Wt 176.0 lb

## 2023-11-23 DIAGNOSIS — R1310 Dysphagia, unspecified: Secondary | ICD-10-CM

## 2023-11-23 NOTE — Telephone Encounter (Signed)
 Noted! Pt was advised of this at appt.

## 2023-11-23 NOTE — Telephone Encounter (Signed)
 Copied from CRM [id number]. Topic: Clinical - Medication Refill >> Nov 22, 2023  1:01 PM Annelle Kiel wrote: Medication: [id number]  eszopiclone  3 MG TABS  Rx #: [id number]  Oxycodone  HCl 10 MG TABS [[id number]]    Has the patient contacted their pharmacy? Yes (Agent: If no, request that the patient contact the pharmacy for the refill. If patient does not wish to contact the pharmacy document the reason why and proceed with request.) (Agent: If yes, when and what did the pharmacy advise?)  This is the patient's preferred pharmacy:  [name] [address] [place] [address] Phone: [phone] Fax: [phone]  Is this the correct pharmacy for this prescription? Yes If no, delete pharmacy and type the correct one.   Has the prescription been filled recently? No  Is the patient out of the medication? Yes  Has the patient been seen for an appointment in the last year OR does the patient have an upcoming appointment? Yes  Can we respond through MyChart? No  Agent: Please be advised that Rx refills may take up to 3 business days. We ask that you follow-up with your pharmacy.    Patient is really needing his medication filled he is out

## 2023-11-23 NOTE — Telephone Encounter (Signed)
 Okay for refill?

## 2023-11-23 NOTE — Progress Notes (Signed)
 Subjective:    Patient ID: Marcus [name], male    DOB: Jun 22, 1941, 83 y.o.   MRN: [id number]  HPI  83 year old male who  has a past medical history of Acute meniscal tear of knee (left), Arthritis, AV block (08/01/2018), Benign essential hypertension (01/18/2007), Bronchiectasis with acute exacerbation (06/23/2020), Chest pain, atypical (08/23/2014), Chronic cough (10/07/2014), Coronary artery disease, Degeneration of lumbar intervertebral disc (10/14/2017), Diverticulosis of colon (07/07/2005), Elevated PSA (06/19/2014), GERD (gastroesophageal reflux disease) (01/18/2007), Gout, unspecified (01/18/2007), Hemorrhoids (01/19/2011), Hiatal hernia, History of colonic polyps (01/18/2007), Insomnia, unspecified (08/21/2007), Iron deficiency anemia, unspecified  (01/19/2011), Lumbar post-laminectomy syndrome (10/14/2017), Lumbar spondylolysis, Lumbar strain (12/14/2011), Obstructive sleep apnea (01/18/2007), Paraesophageal hernia, Primary osteoarthritis of knee (05/23/2015), Prostate cancer (HCC) (2016), S/P knee replacement (05/23/2015), Sensorineural hearing loss, bilateral, Vitamin B12 deficiency, and Vitamin D  deficiency (10/28/2009).  He presents to the office today with his son for the complaint of Dysphagia and spitting up " thick phlegm". He is having trouble swallowing foods and will often feel as though ghr food get stuck in his throat. He will drink water  to get the food to go down. He has not choked.   Review of Systems See HPI   Past Medical History:  Diagnosis Date   Acute meniscal tear of knee left   Arthritis    generalized   AV block 08/01/2018   Benign essential hypertension 01/18/2007   Bronchiectasis with acute exacerbation 06/23/2020   Chest pain, atypical 08/23/2014   Chronic cough 10/07/2014   Coronary artery disease    Degeneration of lumbar intervertebral disc 10/14/2017   Diverticulosis of colon 07/07/2005   Elevated PSA 06/19/2014   GERD (gastroesophageal reflux  disease) 01/18/2007   Gout, unspecified 01/18/2007   Hemorrhoids 01/19/2011   Hiatal hernia    History of colonic polyps 01/18/2007   Insomnia, unspecified 08/21/2007   Iron deficiency anemia, unspecified  01/19/2011   Lumbar post-laminectomy syndrome 10/14/2017   Lumbar spondylolysis    Lumbar strain 12/14/2011   Obstructive sleep apnea 01/18/2007   uses CPAP nightly   Paraesophageal hernia    large   Primary osteoarthritis of knee 05/23/2015   Prostate cancer (HCC) 2016   S/P knee replacement 05/23/2015   Sensorineural hearing loss, bilateral    Vitamin B12 deficiency    Vitamin D  deficiency 10/28/2009    Social History   Socioeconomic History   Marital status: Widowed    Spouse name: Not on file   Number of children: 4   Years of education: 73   Highest education level: Some college, no degree  Occupational History   Occupation: retired    Associate Professor: [employer]  Tobacco Use   Smoking status: Former    Current packs/day: 0.00    Average packs/day: 2.0 packs/day for 20.0 years (40.0 ttl pk-yrs)    Types: Cigarettes    Start date: 07/05/1954    Quit date: 07/05/1974    Years since quitting: 49.4   Smokeless tobacco: Never  Vaping Use   Vaping status: Never Used  Substance and Sexual Activity   Alcohol use: Yes    Comment: wine   Drug use: No   Sexual activity: Not Currently  Other Topics Concern   Not on file  Social History Narrative   Recently widowed   Former Smoker    Alcohol use-yes 1-2 drinks per day      Occupation: Retired Associate Professor      Originally from [place], [place] - in [place] > 10  yrs as of 2017      Social Drivers of Health   Financial Resource Strain: Low Risk  (07/28/2021)   Overall Financial Resource Strain (CARDIA)    Difficulty of Paying Living Expenses: Not hard at all  Food Insecurity: No Food Insecurity (10/13/2023)   Hunger Vital Sign    Worried About Running Out of Food in the Last Year: Never true    Ran Out of Food in the Last  Year: Never true  Transportation Needs: No Transportation Needs (10/13/2023)   PRAPARE - Administrator, Civil Service (Medical): No    Lack of Transportation (Non-Medical): No  Physical Activity: Inactive (07/28/2021)   Exercise Vital Sign    Days of Exercise per Week: 0 days    Minutes of Exercise per Session: 0 min  Stress: No Stress Concern Present (07/28/2021)   [employer] of Occupational Health - Occupational Stress Questionnaire    Feeling of Stress : Not at all  Social Connections: Moderately Isolated (10/03/2023)   Social Connection and Isolation Panel [NHANES]    Frequency of Communication with Friends and Family: Three times a week    Frequency of Social Gatherings with Friends and Family: More than three times a week    Attends Religious Services: More than 4 times per year    Active Member of Clubs or Organizations: No    Attends Banker Meetings: Never    Marital Status: Widowed  Intimate Partner Violence: Not At Risk (10/13/2023)   Humiliation, Afraid, Rape, and Kick questionnaire    Fear of Current or Ex-Partner: No    Emotionally Abused: No    Physically Abused: No    Sexually Abused: No    Past Surgical History:  Procedure Laterality Date   BILIARY BRUSHING  12/13/2022   Procedure: BILIARY BRUSHING;  Surgeon: Kenney Peacemaker, MD;  Location: [facility] ENDOSCOPY;  Service: Gastroenterology;;   BILIARY STENT PLACEMENT  12/13/2022   Procedure: BILIARY STENT PLACEMENT;  Surgeon: Kenney Peacemaker, MD;  Location: [facility] ENDOSCOPY;  Service: Gastroenterology;;   BILIARY STENT PLACEMENT N/A 02/07/2023   Procedure: BILIARY STENT PLACEMENT;  Surgeon: Normie Becton., MD;  Location: [facility] ENDOSCOPY;  Service: Gastroenterology;  Laterality: N/A;   BIOPSY  12/23/2022   Procedure: BIOPSY;  Surgeon: Brice Campi Albino Alu., MD;  Location: [facility] ENDOSCOPY;  Service: Gastroenterology;;   BIOPSY  09/05/2023   Procedure: BIOPSY;  Surgeon: Normie Becton., MD;   Location: [facility] ENDOSCOPY;  Service: Gastroenterology;;   BRONCHIAL WASHINGS  10/30/2019   Procedure: BRONCHIAL WASHINGS;  Surgeon: Joesph Mussel, DO;  Location: [facility] ENDOSCOPY;  Service: Endoscopy;;   CARDIAC CATHETERIZATION     CATARACT EXTRACTION W/ INTRAOCULAR LENS  IMPLANT, BILATERAL Bilateral    COLONOSCOPY     DUODENAL STENT PLACEMENT N/A 09/05/2023   Procedure: INSERTION, STENT, DUODENUM;  Surgeon: Normie Becton., MD;  Location: [facility] ENDOSCOPY;  Service: Gastroenterology;  Laterality: N/A;   ENDOSCOPIC RETROGRADE CHOLANGIOPANCREATOGRAPHY (ERCP) WITH PROPOFOL  N/A 02/07/2023   Procedure: ENDOSCOPIC RETROGRADE CHOLANGIOPANCREATOGRAPHY (ERCP) WITH PROPOFOL ;  Surgeon: Brice Campi Albino Alu., MD;  Location: [facility] ENDOSCOPY;  Service: Gastroenterology;  Laterality: N/A;  Stent change   ERCP N/A 12/13/2022   Procedure: ENDOSCOPIC RETROGRADE CHOLANGIOPANCREATOGRAPHY (ERCP);  Surgeon: Kenney Peacemaker, MD;  Location: [facility] ENDOSCOPY;  Service: Gastroenterology;  Laterality: N/A;  with stent placement   ESOPHAGOGASTRODUODENOSCOPY     ESOPHAGOGASTRODUODENOSCOPY (EGD) WITH PROPOFOL  N/A 12/23/2022   Procedure: ESOPHAGOGASTRODUODENOSCOPY (EGD) WITH PROPOFOL ;  Surgeon: Normie Becton., MD;  Location: [facility] ENDOSCOPY;  Service: Gastroenterology;  Laterality: N/A;   ESOPHAGOGASTRODUODENOSCOPY (EGD) WITH PROPOFOL  N/A 09/05/2023   Procedure: ESOPHAGOGASTRODUODENOSCOPY (EGD) WITH PROPOFOL ;  Surgeon: Brice Campi Albino Alu., MD;  Location: [facility] ENDOSCOPY;  Service: Gastroenterology;  Laterality: N/A;   EUS N/A 12/23/2022   Procedure: UPPER ENDOSCOPIC ULTRASOUND (EUS) RADIAL;  Surgeon: Normie Becton., MD;  Location: [facility] ENDOSCOPY;  Service: Gastroenterology;  Laterality: N/A;   FINE NEEDLE ASPIRATION  12/23/2022   Procedure: FINE NEEDLE ASPIRATION (FNA) RADIAL;  Surgeon: Normie Becton., MD;  Location: [facility] ENDOSCOPY;  Service: Gastroenterology;;   IR CHOLANGIOGRAM EXISTING TUBE  10/31/2023   IR EXCHANGE  BILIARY DRAIN  11/16/2023   IR IMAGING GUIDED PORT INSERTION  01/04/2023   IR PATIENT EVAL TECH 0-60 MINS  10/28/2023   IR PERC CHOLECYSTOSTOMY  09/03/2023   IR PERC CHOLECYSTOSTOMY  10/04/2023   IR SINUS/FIST TUBE CHK-NON GI  09/22/2023   KNEE ARTHROSCOPY Right 2007   KNEE ARTHROSCOPY  07/13/2011   Procedure: ARTHROSCOPY KNEE;  Surgeon: Valdene Garret;  Location: [facility];  Service: Orthopedics;  Laterality: Left;  partial menisectomy with chondrylplasty   KNEE SURGERY Left    LAMINECTOMY AND MICRODISCECTOMY LUMBAR SPINE  MARCH  2008   L3 -  4   LAPAROSCOPIC INCISIONAL / UMBILICAL / VENTRAL HERNIA REPAIR  2006   PACEMAKER IMPLANT N/A 08/02/2018   Procedure: PACEMAKER IMPLANT;  Surgeon: Tammie Fall, MD;  Location: [facility];  Service: Cardiovascular;  Laterality: N/A;   PROSTATE BIOPSY     RADIOACTIVE SEED IMPLANT N/A 02/12/2019   Procedure: RADIOACTIVE SEED IMPLANT/BRACHYTHERAPY IMPLANT;  Surgeon: Trent Frizzle, MD;  Location: [facility] ORS;  Service: Urology;  Laterality: N/A;  90 MINS   REMOVAL OF STONES  02/07/2023   Procedure: REMOVAL OF Sludge;  Surgeon: Brice Campi Albino Alu., MD;  Location: [facility] ENDOSCOPY;  Service: Gastroenterology;;   SHOULDER ARTHROSCOPY Right 05-23-2007   SIGMOID COLECTOMY FOR CANCER  1989   SPACE OAR INSTILLATION N/A 02/12/2019   Procedure: SPACE OAR INSTILLATION;  Surgeon: Trent Frizzle, MD;  Location: [facility] ORS;  Service: Urology;  Laterality: N/A;   SPHINCTEROTOMY  12/13/2022   Procedure: SPHINCTEROTOMY;  Surgeon: Kenney Peacemaker, MD;  Location: [facility] ENDOSCOPY;  Service: Gastroenterology;;   Yuvonne Herald REMOVAL  02/07/2023   Procedure: STENT REMOVAL;  Surgeon: Normie Becton., MD;  Location: [facility]OSCOPY;  Service: Gastroenterology;;   TOTAL KNEE ARTHROPLASTY Left 05/23/2015   Procedure: LEFT TOTAL KNEE ARTHROPLASTY;  Surgeon: Genevie Kerns, MD;  Location: [facility] ORS;  Service: Orthopedics;  Laterality: Left;   VERTICAL BANDED GASTROPLASTY   1986   VIDEO BRONCHOSCOPY N/A 10/30/2019   Procedure: VIDEO BRONCHOSCOPY WITH BRONICAL ALVEROLAR LAVAGE WITHOUT FLUORO;  Surgeon: Joesph Mussel, DO;  Location: [facility] ENDOSCOPY;  Service: Endoscopy;  Laterality: N/A;    Family History  Problem Relation Age of Onset   Stroke Paternal Grandfather    Heart disease Paternal Grandfather    Esophagitis Father        died from perforated esophagus   Breast cancer Mother    Colon cancer Maternal Grandmother        questionable   Esophageal cancer Neg Hx    Prostate cancer Neg Hx    Stomach cancer Neg Hx     No Known Allergies  Current Outpatient Medications on File Prior to Visit  Medication Sig Dispense Refill   acetaminophen  (TYLENOL ) 325 MG tablet Take 2 tablets (650 mg total) by mouth every 6 (six) hours  as needed for mild pain (or Fever >/= 101). 20 tablet 0   eszopiclone  3 MG TABS Take 1 tablet (3 mg total) by mouth at bedtime. Take immediately before bedtime. (Patient taking differently: Take 3 mg by mouth at bedtime as needed (sleep).) 30 tablet 2   furosemide  (LASIX ) 20 MG tablet Take 20 mg by mouth daily.     furosemide  (LASIX ) 20 MG tablet Take 1 tablet (20 mg total) by mouth once daily as needed for edema. 30 tablet 0   LORazepam  (ATIVAN ) 0.5 MG tablet Take 1 tablet (0.5 mg total) by mouth every 8 (eight) hours as needed for anxiety. 30 tablet 0   oxybutynin  (DITROPAN ) 5 MG tablet Take 5 mg by mouth 2 (two) times daily as needed for bladder spasms (urinary incontinence).     Oxycodone  HCl 10 MG TABS Take 1 tablet (10 mg total) by mouth every 4 (four) hours as directed. 60 tablet 0   Polyethylene Glycol 3350  (MIRALAX  PO) Take 17 g by mouth daily as needed.     prochlorperazine  (COMPAZINE ) 10 MG tablet Take 1 tablet (10 mg total) by mouth every 4 (four) hours as needed. 30 tablet 2   senna (GERI-KOT) 8.6 MG tablet Take 2 tablets (17.2 mg total) by mouth 2 (two) times daily as needed For Constipation. 60 tablet 3   senna (SENNA-LAX)  8.6 MG tablet Take 2 tablets (17.2 mg total) by mouth 2 (two) times daily as needed for constipation. 60 tablet 3   tamsulosin  (FLOMAX ) 0.4 MG CAPS capsule Take 1 capsule by mouth daily.     apixaban  (ELIQUIS ) 5 MG TABS tablet Take 1 tablet (5 mg total) by mouth 2 (two) times daily. 180 tablet 1   No current facility-administered medications on file prior to visit.    BP 100/60   Pulse 64   Temp 98 F (36.7 C) (Oral)   Ht 5\' 8"  (1.727 m)   Wt 176 lb (79.8 kg)   SpO2 98%   BMI 26.76 kg/m       Objective:   Physical Exam Vitals and nursing note reviewed.  Constitutional:      Appearance: Normal appearance.  Cardiovascular:     Rate and Rhythm: Normal rate and regular rhythm.     Pulses: Normal pulses.     Heart sounds: Normal heart sounds.  Pulmonary:     Effort: Pulmonary effort is normal.     Breath sounds: Normal breath sounds.  Musculoskeletal:        General: Normal range of motion.  Skin:    General: Skin is warm and dry.  Neurological:     General: No focal deficit present.     Mental Status: He is alert and oriented to person, place, and time.  Psychiatric:        Mood and Affect: Mood normal.        Behavior: Behavior normal.        Thought Content: Thought content normal.        Judgment: Judgment normal.        Assessment & Plan:  1. Dysphagia, unspecified type (Primary) - He is on hospice care for pancreatic cancer. I do not think he would get any benefit from and EGD. Will have his son pick up liquid mucinex  to try and thin secretions.   Tykira Wachs, NP

## 2023-11-24 ENCOUNTER — Other Ambulatory Visit (HOSPITAL_BASED_OUTPATIENT_CLINIC_OR_DEPARTMENT_OTHER): Payer: Self-pay

## 2023-11-24 MED ORDER — OXYCODONE HCL 20 MG PO TABS
20.0000 mg | ORAL_TABLET | Freq: Four times a day (QID) | ORAL | 0 refills | Status: DC
  Filled 2023-11-24: qty 120, 30d supply, fill #0

## 2023-11-24 MED ORDER — METHADONE HCL 5 MG PO TABS
5.0000 mg | ORAL_TABLET | Freq: Three times a day (TID) | ORAL | 0 refills | Status: DC
  Filled 2023-11-24: qty 90, 30d supply, fill #0

## 2023-11-30 DIAGNOSIS — R262 Difficulty in walking, not elsewhere classified: Secondary | ICD-10-CM | POA: Diagnosis not present

## 2023-11-30 DIAGNOSIS — Z9181 History of falling: Secondary | ICD-10-CM | POA: Diagnosis not present

## 2023-11-30 DIAGNOSIS — R2689 Other abnormalities of gait and mobility: Secondary | ICD-10-CM | POA: Diagnosis not present

## 2023-12-03 ENCOUNTER — Other Ambulatory Visit (HOSPITAL_BASED_OUTPATIENT_CLINIC_OR_DEPARTMENT_OTHER): Payer: Self-pay

## 2023-12-03 MED ORDER — NYSTATIN 100000 UNIT/ML MT SUSP
5.0000 mL | OROMUCOSAL | 0 refills | Status: DC
  Filled 2023-12-03: qty 210, 7d supply, fill #0

## 2023-12-05 ENCOUNTER — Other Ambulatory Visit (HOSPITAL_BASED_OUTPATIENT_CLINIC_OR_DEPARTMENT_OTHER): Payer: Self-pay

## 2023-12-12 ENCOUNTER — Other Ambulatory Visit (HOSPITAL_BASED_OUTPATIENT_CLINIC_OR_DEPARTMENT_OTHER): Payer: Self-pay

## 2023-12-13 ENCOUNTER — Other Ambulatory Visit (HOSPITAL_BASED_OUTPATIENT_CLINIC_OR_DEPARTMENT_OTHER): Payer: Self-pay

## 2023-12-13 MED ORDER — QUETIAPINE FUMARATE 50 MG PO TABS
50.0000 mg | ORAL_TABLET | Freq: Four times a day (QID) | ORAL | 3 refills | Status: DC | PRN
  Filled 2023-12-13: qty 120, 30d supply, fill #0

## 2023-12-14 ENCOUNTER — Emergency Department (HOSPITAL_COMMUNITY)
Admission: EM | Admit: 2023-12-14 | Discharge: 2023-12-14 | Disposition: A | Discharge status: Home / Self Care | Attending: Emergency Medicine | Admitting: Emergency Medicine

## 2023-12-14 ENCOUNTER — Encounter (HOSPITAL_COMMUNITY): Payer: Self-pay | Admitting: Emergency Medicine

## 2023-12-14 ENCOUNTER — Emergency Department (HOSPITAL_COMMUNITY)

## 2023-12-14 ENCOUNTER — Other Ambulatory Visit: Payer: Self-pay

## 2023-12-14 ENCOUNTER — Other Ambulatory Visit (HOSPITAL_BASED_OUTPATIENT_CLINIC_OR_DEPARTMENT_OTHER): Payer: Self-pay

## 2023-12-14 DIAGNOSIS — Y92009 Unspecified place in unspecified non-institutional (private) residence as the place of occurrence of the external cause: Secondary | ICD-10-CM | POA: Insufficient documentation

## 2023-12-14 DIAGNOSIS — W1830XA Fall on same level, unspecified, initial encounter: Secondary | ICD-10-CM | POA: Diagnosis not present

## 2023-12-14 DIAGNOSIS — R410 Disorientation, unspecified: Secondary | ICD-10-CM | POA: Diagnosis not present

## 2023-12-14 DIAGNOSIS — R0789 Other chest pain: Secondary | ICD-10-CM | POA: Insufficient documentation

## 2023-12-14 DIAGNOSIS — W19XXXA Unspecified fall, initial encounter: Secondary | ICD-10-CM

## 2023-12-14 DIAGNOSIS — C259 Malignant neoplasm of pancreas, unspecified: Secondary | ICD-10-CM | POA: Insufficient documentation

## 2023-12-14 DIAGNOSIS — M7918 Myalgia, other site: Secondary | ICD-10-CM | POA: Insufficient documentation

## 2023-12-14 DIAGNOSIS — R0781 Pleurodynia: Secondary | ICD-10-CM | POA: Diagnosis not present

## 2023-12-14 MED ORDER — METHADONE HCL 5 MG PO TABS: 5.0000 mg | ORAL_TABLET | Freq: Three times a day (TID) | ORAL | 0 refills | Status: DC

## 2023-12-14 MED ORDER — ACETAMINOPHEN 500 MG PO TABS
1000.0000 mg | ORAL_TABLET | Freq: Once | ORAL | Status: AC
  Administered 2023-12-14: 1000 mg via ORAL
  Filled 2023-12-14: qty 2

## 2023-12-14 MED ORDER — LORAZEPAM 0.5 MG PO TABS
0.5000 mg | ORAL_TABLET | Freq: Three times a day (TID) | ORAL | 0 refills | Status: DC | PRN
  Filled 2023-12-14: qty 45, 15d supply, fill #0

## 2023-12-14 MED ORDER — OXYCODONE HCL 5 MG PO TABS
10.0000 mg | ORAL_TABLET | Freq: Once | ORAL | Status: AC
  Administered 2023-12-14: 10 mg via ORAL
  Filled 2023-12-14: qty 2

## 2023-12-14 NOTE — ED Triage Notes (Signed)
 Pt BIB EMS from home with family.  Hospice patient.  End stage pancreatic cancer.  Drain in place.  Family reports he fell yesterday and hospice nurse checked him out and felt his weakness and confusion were progression of cancer.  Pt had another fall today and struck ribs on counter.  Pt has pain with deep inspiration. No head injury.  Is on thinners.

## 2023-12-14 NOTE — ED Provider Notes (Signed)
 [place] EMERGENCY DEPARTMENT AT [facility] Provider Note   CSN: [id number] Arrival date & time: 12/14/23  0151     History Chief Complaint  Patient presents with   Fall    HPI Marcus [name] is a 83 y.o. male presenting for ground-level fall.  He is a 83 year old male with end-stage pancreatic cancer currently on hospice therapy.  Family states they are struggling to control his falls and he is having uncontrolled pain at home. They are requesting medication assistance. Initially brought in by EMS but given his hospice placement, waited for family to come to bedside for any further decision making. They state that his scope of therapy is strictly symptom control and for requesting education on how to use the pain medications he was prescribed..   Patient's recorded medical, surgical, social, medication list and allergies were reviewed in the Snapshot window as part of the initial history.   Review of Systems   Review of Systems  Unable to perform ROS: Mental status change    Physical Exam Updated Vital Signs BP (!) 147/117   Pulse 64   Temp 98 F (36.7 C)   Resp 14   SpO2 100%  Physical Exam Vitals and nursing note reviewed.  Constitutional:      General: He is not in acute distress.    Appearance: He is well-developed.  HENT:     Head: Normocephalic and atraumatic.  Eyes:     Conjunctiva/sclera: Conjunctivae normal.  Cardiovascular:     Rate and Rhythm: Normal rate and regular rhythm.     Heart sounds: No murmur heard. Pulmonary:     Effort: Pulmonary effort is normal. No respiratory distress.     Breath sounds: Normal breath sounds.  Chest:     Chest wall: Tenderness present.  Abdominal:     Palpations: Abdomen is soft.     Tenderness: There is no abdominal tenderness.  Musculoskeletal:        General: Tenderness present. No swelling.     Cervical back: Neck supple.  Skin:    General: Skin is warm and dry.     Capillary Refill: Capillary  refill takes less than 2 seconds.  Neurological:     Mental Status: He is alert.  Psychiatric:        Mood and Affect: Mood normal.      ED Course/ Medical Decision Making/ A&P    Procedures Procedures   Medications Ordered in ED Medications  oxyCODONE  (Oxy IR/ROXICODONE ) immediate release tablet 10 mg (10 mg Oral Given 12/14/23 0331)  acetaminophen  (TYLENOL ) tablet 1,000 mg (1,000 mg Oral Given 12/14/23 9629)   Medical Decision Making:    Marcus [name] is a 83 y.o. male who presented to the ED today with a moderate mechanisma trauma, detailed above.    Handoff received from EMS.  Additional history discussed with patient's family/caregivers.  Patient placed on continuous vitals and telemetry monitoring while in ED which was reviewed periodically.   Given this mechanism of trauma, a full physical exam was performed. Notably, patient was HDS in NAD.   Reviewed and confirmed nursing documentation for past medical history, family history, social history.    Initial Assessment/Plan:   This is a patient presenting with a moderate mechanism trauma.  As such, I have considered intracranial injuries including intracranial hemorrhage, intrathoracic injuries including blunt myocardial or blunt lung injury, blunt abdominal injuries including aortic dissection, bladder injury, spleen injury, liver injury and I have considered orthopedic injuries including  extremity or spinal injury.  With the patient's presentation of moderate mechanism trauma but an otherwise reassuring exam, patient warrants targeted evaluation for potential traumatic injuries. Will proceed with targeted evaluation for potential injuries. Will proceed with CXR. Objective evaluation resulted with NAA.   Final Reassessment and Plan:   Considered a more broad evaluation, however shared medical decision making with family, they stated that they would feel comfortable with a comfort care scope of evaluation today.  He has no  large deformity on his chest x-ray.  Pain medications ordered for symptomatic management and family educated on utilization of these medications.  Patient resting comfortably on serial assessments.  They have a hospice care team member coming out to evaluate the patient again today.  Strict return precautions, education provided for patient and family patient discharged after 2 and half hours of observation.   Disposition:  I have considered need for hospitalization, however, considering all of the above, I believe this patient is stable for discharge at this time.  Patient/family educated about specific return precautions for given chief complaint and symptoms.  Patient/family educated about follow-up with PCP.     Patient/family expressed understanding of return precautions and need for follow-up. Patient spoken to regarding all imaging and laboratory results and appropriate follow up for these results. All education provided in verbal form with additional information in written form. Time was allowed for answering of patient questions. Patient discharged.    Emergency Department Medication Summary:   Medications  oxyCODONE  (Oxy IR/ROXICODONE ) immediate release tablet 10 mg (10 mg Oral Given 12/14/23 0331)  acetaminophen  (TYLENOL ) tablet 1,000 mg (1,000 mg Oral Given 12/14/23 0332)          Clinical Impression:  1. Fall, initial encounter      Discharge   Final Clinical Impression(s) / ED Diagnoses Final diagnoses:  Fall, initial encounter    Rx / DC Orders ED Discharge Orders     None         [name], MD 12/14/23 [phone]

## 2023-12-15 ENCOUNTER — Other Ambulatory Visit (HOSPITAL_BASED_OUTPATIENT_CLINIC_OR_DEPARTMENT_OTHER): Payer: Self-pay

## 2023-12-16 ENCOUNTER — Other Ambulatory Visit (HOSPITAL_BASED_OUTPATIENT_CLINIC_OR_DEPARTMENT_OTHER): Payer: Self-pay

## 2023-12-23 ENCOUNTER — Ambulatory Visit: Admitting: Oncology

## 2023-12-23 ENCOUNTER — Other Ambulatory Visit

## 2023-12-31 DIAGNOSIS — Z9181 History of falling: Secondary | ICD-10-CM | POA: Diagnosis not present

## 2023-12-31 DIAGNOSIS — R2689 Other abnormalities of gait and mobility: Secondary | ICD-10-CM | POA: Diagnosis not present

## 2023-12-31 DIAGNOSIS — R262 Difficulty in walking, not elsewhere classified: Secondary | ICD-10-CM | POA: Diagnosis not present

## 2024-01-02 NOTE — Progress Notes (Signed)
 Remote pacemaker transmission.

## 2024-01-02 NOTE — Addendum Note (Signed)
 Addended by: TAWNI DRILLING D on: 01/02/2024 12:08 PM   Modules accepted: Orders, Level of Service

## 2024-01-03 DEATH — deceased

## 2024-01-10 ENCOUNTER — Ambulatory Visit: Admitting: Physician Assistant

## 2024-01-11 ENCOUNTER — Other Ambulatory Visit (HOSPITAL_COMMUNITY)
